# Patient Record
Sex: Female | Born: 1940 | Race: White | Hispanic: No | Smoking: Never smoker
Health system: Southern US, Community
[De-identification: ages and names within clinical notes are randomized; demographics above are authoritative.]

## PROBLEM LIST (undated history)

## (undated) DIAGNOSIS — C50919 Malignant neoplasm of unspecified site of unspecified female breast: Secondary | ICD-10-CM

## (undated) DIAGNOSIS — M1990 Unspecified osteoarthritis, unspecified site: Secondary | ICD-10-CM

## (undated) DIAGNOSIS — Z5189 Encounter for other specified aftercare: Secondary | ICD-10-CM

## (undated) DIAGNOSIS — F419 Anxiety disorder, unspecified: Secondary | ICD-10-CM

## (undated) DIAGNOSIS — T7840XA Allergy, unspecified, initial encounter: Secondary | ICD-10-CM

## (undated) DIAGNOSIS — J189 Pneumonia, unspecified organism: Secondary | ICD-10-CM

## (undated) DIAGNOSIS — R112 Nausea with vomiting, unspecified: Secondary | ICD-10-CM

## (undated) DIAGNOSIS — E039 Hypothyroidism, unspecified: Secondary | ICD-10-CM

## (undated) DIAGNOSIS — IMO0001 Reserved for inherently not codable concepts without codable children: Secondary | ICD-10-CM

## (undated) DIAGNOSIS — I10 Essential (primary) hypertension: Secondary | ICD-10-CM

## (undated) DIAGNOSIS — IMO0002 Reserved for concepts with insufficient information to code with codable children: Secondary | ICD-10-CM

## (undated) DIAGNOSIS — C7951 Secondary malignant neoplasm of bone: Secondary | ICD-10-CM

## (undated) DIAGNOSIS — Z9889 Other specified postprocedural states: Secondary | ICD-10-CM

## (undated) DIAGNOSIS — O2230 Deep phlebothrombosis in pregnancy, unspecified trimester: Secondary | ICD-10-CM

## (undated) DIAGNOSIS — Z923 Personal history of irradiation: Secondary | ICD-10-CM

## (undated) HISTORY — DX: Malignant neoplasm of unspecified site of unspecified female breast: C50.919

## (undated) HISTORY — PX: DILATION AND CURETTAGE OF UTERUS: SHX78

## (undated) HISTORY — DX: Reserved for concepts with insufficient information to code with codable children: IMO0002

## (undated) HISTORY — DX: Deep phlebothrombosis in pregnancy, unspecified trimester: O22.30

## (undated) HISTORY — DX: Essential (primary) hypertension: I10

## (undated) HISTORY — DX: Unspecified osteoarthritis, unspecified site: M19.90

## (undated) HISTORY — DX: Encounter for other specified aftercare: Z51.89

## (undated) HISTORY — DX: Hypothyroidism, unspecified: E03.9

## (undated) HISTORY — DX: Pneumonia, unspecified organism: J18.9

## (undated) HISTORY — DX: Allergy, unspecified, initial encounter: T78.40XA

## (undated) HISTORY — DX: Anxiety disorder, unspecified: F41.9

## (undated) HISTORY — DX: Reserved for inherently not codable concepts without codable children: IMO0001

---

## 2000-10-30 ENCOUNTER — Emergency Department (HOSPITAL_COMMUNITY): Admission: EM | Admit: 2000-10-30 | Discharge: 2000-10-30 | Payer: Self-pay | Admitting: Emergency Medicine

## 2000-10-30 ENCOUNTER — Encounter: Payer: Self-pay | Admitting: Emergency Medicine

## 2000-11-01 ENCOUNTER — Emergency Department (HOSPITAL_COMMUNITY): Admission: EM | Admit: 2000-11-01 | Discharge: 2000-11-01 | Payer: Self-pay | Admitting: *Deleted

## 2006-02-25 ENCOUNTER — Emergency Department (HOSPITAL_COMMUNITY): Admission: EM | Admit: 2006-02-25 | Discharge: 2006-02-25 | Payer: Self-pay | Admitting: Emergency Medicine

## 2009-10-01 ENCOUNTER — Encounter: Admission: RE | Admit: 2009-10-01 | Discharge: 2009-10-01 | Payer: Self-pay | Admitting: Family Medicine

## 2009-10-13 ENCOUNTER — Encounter: Admission: RE | Admit: 2009-10-13 | Discharge: 2009-10-13 | Payer: Self-pay | Admitting: Family Medicine

## 2011-05-22 ENCOUNTER — Ambulatory Visit (INDEPENDENT_AMBULATORY_CARE_PROVIDER_SITE_OTHER): Payer: Medicare Other

## 2011-05-22 DIAGNOSIS — J019 Acute sinusitis, unspecified: Secondary | ICD-10-CM

## 2011-06-06 ENCOUNTER — Ambulatory Visit (INDEPENDENT_AMBULATORY_CARE_PROVIDER_SITE_OTHER): Payer: Medicare Other | Admitting: Family Medicine

## 2011-06-06 DIAGNOSIS — E039 Hypothyroidism, unspecified: Secondary | ICD-10-CM | POA: Diagnosis not present

## 2011-06-06 DIAGNOSIS — J0100 Acute maxillary sinusitis, unspecified: Secondary | ICD-10-CM

## 2011-08-22 ENCOUNTER — Ambulatory Visit (INDEPENDENT_AMBULATORY_CARE_PROVIDER_SITE_OTHER): Payer: Medicare Other | Admitting: Family Medicine

## 2011-08-22 VITALS — BP 146/80 | HR 98 | Temp 98.0°F | Resp 24 | Ht 63.5 in | Wt 228.6 lb

## 2011-08-22 DIAGNOSIS — E039 Hypothyroidism, unspecified: Secondary | ICD-10-CM

## 2011-08-22 DIAGNOSIS — R358 Other polyuria: Secondary | ICD-10-CM

## 2011-08-22 DIAGNOSIS — I10 Essential (primary) hypertension: Secondary | ICD-10-CM

## 2011-08-22 DIAGNOSIS — R3589 Other polyuria: Secondary | ICD-10-CM

## 2011-08-22 DIAGNOSIS — R0602 Shortness of breath: Secondary | ICD-10-CM

## 2011-08-22 DIAGNOSIS — F418 Other specified anxiety disorders: Secondary | ICD-10-CM

## 2011-08-22 DIAGNOSIS — K089 Disorder of teeth and supporting structures, unspecified: Secondary | ICD-10-CM

## 2011-08-22 LAB — POCT UA - MICROSCOPIC ONLY
Casts, Ur, LPF, POC: NEGATIVE
Mucus, UA: NEGATIVE
Yeast, UA: NEGATIVE

## 2011-08-22 NOTE — Progress Notes (Signed)
  Subjective:    Patient ID: Alicia Vasquez, female    DOB: 07/10/1940, 71 y.o.   MRN: [id number]  HPI  Patient presents for routine follow up  States knee arthralgias limits activity. DOE as patient has become more sedentary.(Cardio-pulmonary evaluation normal)  Pruritis in nipple area; has not had mammogram in several years.  Hair thinner Polyuria  Lots of stress and worry     Review of Systems     Objective:   Physical Exam  Constitutional: She appears well-developed.  HENT:  Mouth/Throat: Abnormal dentition.    Neck: Neck supple. No thyromegaly present.  Cardiovascular: Normal rate, regular rhythm and normal heart sounds.   No murmur heard. Pulmonary/Chest: Effort normal and breath sounds normal. Right breast exhibits no inverted nipple, no mass, no nipple discharge and no skin change. Left breast exhibits no inverted nipple, no mass, no nipple discharge and no skin change.          Assessment & Plan:   1. Polyuria  POCT UA - Microscopic Only  2. HTN (hypertension)    3. Hypothyroid    4. SOB (shortness of breath), related to deconditioning    5. Depression with anxiety    6. Dental disease     Resume Celexa Encouraged short walks daily for conditioning increasing her walks by 1 minute weekly Mammogram to evaluate nipple pruritis Dental visit for extractions Follow up in 1 month

## 2011-08-26 ENCOUNTER — Encounter: Payer: Self-pay | Admitting: Family Medicine

## 2011-08-26 DIAGNOSIS — E039 Hypothyroidism, unspecified: Secondary | ICD-10-CM | POA: Insufficient documentation

## 2011-08-26 DIAGNOSIS — R0602 Shortness of breath: Secondary | ICD-10-CM | POA: Insufficient documentation

## 2011-08-26 MED ORDER — CITALOPRAM HYDROBROMIDE 10 MG PO TABS: 10.0000 mg | ORAL_TABLET | Freq: Every day | ORAL | Status: DC

## 2011-09-16 ENCOUNTER — Telehealth: Payer: Self-pay

## 2011-09-16 DIAGNOSIS — Z1239 Encounter for other screening for malignant neoplasm of breast: Secondary | ICD-10-CM

## 2011-09-16 DIAGNOSIS — L299 Pruritus, unspecified: Secondary | ICD-10-CM

## 2011-09-16 NOTE — Telephone Encounter (Signed)
Can we make this referral?

## 2011-09-16 NOTE — Telephone Encounter (Signed)
PT REQUESTS [facility] THEY INFORMED PT SHE CANNOT JUST SCHEDULE APPT W/O AN ORDER FROM HER PCP

## 2011-09-17 NOTE — Telephone Encounter (Signed)
Called pt [facility] referral initiated

## 2011-09-17 NOTE — Telephone Encounter (Signed)
Referral initiated

## 2011-09-19 ENCOUNTER — Other Ambulatory Visit: Payer: Self-pay | Admitting: Internal Medicine

## 2011-09-19 DIAGNOSIS — N644 Mastodynia: Secondary | ICD-10-CM

## 2011-09-26 ENCOUNTER — Ambulatory Visit (INDEPENDENT_AMBULATORY_CARE_PROVIDER_SITE_OTHER): Payer: Medicare Other | Admitting: Family Medicine

## 2011-09-26 ENCOUNTER — Encounter: Payer: Self-pay | Admitting: Family Medicine

## 2011-09-26 VITALS — BP 160/100 | HR 72 | Temp 97.0°F | Resp 16 | Ht 63.0 in | Wt 227.6 lb

## 2011-09-26 DIAGNOSIS — E039 Hypothyroidism, unspecified: Secondary | ICD-10-CM | POA: Diagnosis not present

## 2011-09-26 DIAGNOSIS — I10 Essential (primary) hypertension: Secondary | ICD-10-CM | POA: Diagnosis not present

## 2011-09-26 DIAGNOSIS — J302 Other seasonal allergic rhinitis: Secondary | ICD-10-CM

## 2011-09-26 DIAGNOSIS — F418 Other specified anxiety disorders: Secondary | ICD-10-CM

## 2011-09-26 DIAGNOSIS — R0789 Other chest pain: Secondary | ICD-10-CM

## 2011-09-26 MED ORDER — FLUTICASONE PROPIONATE 50 MCG/ACT NA SUSP: 2.0000 | Freq: Every day | NASAL | Status: DC
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
## 2011-09-27 ENCOUNTER — Ambulatory Visit
Admission: RE | Admit: 2011-09-27 | Discharge: 2011-09-27 | Disposition: A | Discharge status: Home / Self Care | Payer: Medicare Other | Source: Ambulatory Visit | Attending: Internal Medicine | Admitting: Internal Medicine

## 2011-09-27 ENCOUNTER — Other Ambulatory Visit: Payer: Self-pay | Admitting: Internal Medicine

## 2011-09-27 DIAGNOSIS — N6019 Diffuse cystic mastopathy of unspecified breast: Secondary | ICD-10-CM | POA: Diagnosis not present

## 2011-09-27 DIAGNOSIS — H9190 Unspecified hearing loss, unspecified ear: Secondary | ICD-10-CM | POA: Insufficient documentation

## 2011-09-27 DIAGNOSIS — N644 Mastodynia: Secondary | ICD-10-CM

## 2011-09-27 DIAGNOSIS — N63 Unspecified lump in unspecified breast: Secondary | ICD-10-CM | POA: Diagnosis not present

## 2011-09-27 DIAGNOSIS — J302 Other seasonal allergic rhinitis: Secondary | ICD-10-CM | POA: Insufficient documentation

## 2011-09-27 DIAGNOSIS — R0789 Other chest pain: Secondary | ICD-10-CM | POA: Insufficient documentation

## 2011-09-27 DIAGNOSIS — I10 Essential (primary) hypertension: Secondary | ICD-10-CM | POA: Insufficient documentation

## 2011-10-05 ENCOUNTER — Other Ambulatory Visit: Payer: Self-pay | Admitting: Family Medicine

## 2011-10-12 ENCOUNTER — Ambulatory Visit (INDEPENDENT_AMBULATORY_CARE_PROVIDER_SITE_OTHER): Payer: Medicare Other | Admitting: Family Medicine

## 2011-10-12 VITALS — BP 155/85 | HR 77 | Temp 98.4°F | Resp 16 | Ht 64.0 in | Wt 229.0 lb

## 2011-10-12 DIAGNOSIS — J069 Acute upper respiratory infection, unspecified: Secondary | ICD-10-CM | POA: Diagnosis not present

## 2011-10-12 DIAGNOSIS — J329 Chronic sinusitis, unspecified: Secondary | ICD-10-CM

## 2011-10-12 DIAGNOSIS — J40 Bronchitis, not specified as acute or chronic: Secondary | ICD-10-CM

## 2011-10-12 MED ORDER — AZITHROMYCIN 250 MG PO TABS: ORAL_TABLET | ORAL | Status: DC

## 2011-10-12 NOTE — Patient Instructions (Signed)

## 2011-10-12 NOTE — Progress Notes (Signed)
This 71 year old woman has a two-day history of progressive sinus congestion, headache, myalgia, and cough. The cough has been particularly bad with small amount of phlegm produced. Patient has no fever, stiff neck, hemoptysis, or epistaxis. No acid reflux.  Objective: HEENT shows mucopurulent discharge bilaterally in the nasal passages, otherwise there is mild serous otitis changes.  Chest: Few rhonchi, no rales  Heart: Regular no murmur  Assessment: Acute URI with sinusitis and bronchitis  Plan: Z-Pak and Robitussin

## 2011-10-17 ENCOUNTER — Encounter: Payer: Self-pay | Admitting: Family Medicine

## 2011-10-17 ENCOUNTER — Ambulatory Visit: Payer: Medicare Other | Admitting: Family Medicine

## 2011-10-17 ENCOUNTER — Ambulatory Visit (INDEPENDENT_AMBULATORY_CARE_PROVIDER_SITE_OTHER): Payer: Medicare Other | Admitting: Family Medicine

## 2011-10-17 VITALS — BP 146/76 | HR 75 | Temp 97.0°F | Resp 20 | Ht 63.5 in | Wt 228.0 lb

## 2011-10-17 DIAGNOSIS — J329 Chronic sinusitis, unspecified: Secondary | ICD-10-CM

## 2011-10-17 DIAGNOSIS — J42 Unspecified chronic bronchitis: Secondary | ICD-10-CM | POA: Diagnosis not present

## 2011-10-17 DIAGNOSIS — I10 Essential (primary) hypertension: Secondary | ICD-10-CM

## 2011-10-17 DIAGNOSIS — J209 Acute bronchitis, unspecified: Secondary | ICD-10-CM

## 2011-10-17 DIAGNOSIS — J40 Bronchitis, not specified as acute or chronic: Secondary | ICD-10-CM

## 2011-10-17 DIAGNOSIS — E039 Hypothyroidism, unspecified: Secondary | ICD-10-CM

## 2011-10-17 MED ORDER — ALBUTEROL SULFATE (2.5 MG/3ML) 0.083% IN NEBU: 2.5000 mg | INHALATION_SOLUTION | Freq: Once | RESPIRATORY_TRACT | Status: DC

## 2011-10-17 MED ORDER — AZITHROMYCIN 250 MG PO TABS: ORAL_TABLET | ORAL | Status: AC

## 2011-10-17 MED ORDER — IPRATROPIUM BROMIDE 0.02 % IN SOLN: 0.5000 mg | Freq: Once | RESPIRATORY_TRACT | Status: DC

## 2011-10-17 MED ORDER — ALBUTEROL SULFATE HFA 108 (90 BASE) MCG/ACT IN AERS: 2.0000 | INHALATION_SPRAY | Freq: Four times a day (QID) | RESPIRATORY_TRACT | Status: DC | PRN

## 2011-10-17 NOTE — Progress Notes (Signed)
  Subjective:    Patient ID: Alicia Vasquez, female    DOB: 1940-12-26, 71 y.o.   MRN: [id number]  HPI  Patient presents for routine follow up  Bronchitis- seeprior OV; still with facial congestion, post nasal drainage and cough.                    Cough worse at night                    Otic fullness with further impairment in herting                    Has used flonase with some relief                    Finishing Z pack today.   Health Maintenance: Mammogram/Ultrasound- normal Review of Systems     Objective:   Physical Exam  Constitutional: She appears well-developed.  HENT:  Right Ear: Tympanic membrane is retracted.  Left Ear: Tympanic membrane is retracted.  Nose: Rhinorrhea present.  Neck: Neck supple.  Cardiovascular: Normal rate, regular rhythm and normal heart sounds.   Pulmonary/Chest: Effort normal.       Prolong expiratory phase Crackles at (B) bases which cleared after albuterol via HHN  Lymphadenopathy:    She has no cervical adenopathy.       Assessment & Plan:   1. Bronchitis  albuterol (PROVENTIL) (2.5 MG/3ML) 0.083% nebulizer solution 2.5 mg, ipratropium (ATROVENT) nebulizer solution 0.5 mg, albuterol (PROVENTIL HFA;VENTOLIN HFA) 108 (90 BASE) MCG/ACT inhaler, azithromycin (ZITHROMAX Z-PAK) 250 MG tablet  2. Hearing loss  Encouraged patient to reshedule audiometry exam for hearing aides  3. HTN (hypertension)  Continue current medications  4. Hypothyroid  Continue current medications  5. Sinusitis  azithromycin (ZITHROMAX Z-PAK) 250 MG tablet   Follow up 1 week

## 2011-10-22 ENCOUNTER — Ambulatory Visit (INDEPENDENT_AMBULATORY_CARE_PROVIDER_SITE_OTHER): Payer: Medicare Other | Admitting: Family Medicine

## 2011-10-22 VITALS — BP 134/80 | HR 81 | Temp 97.8°F | Resp 16 | Ht 63.5 in | Wt 228.0 lb

## 2011-10-22 DIAGNOSIS — H6980 Other specified disorders of Eustachian tube, unspecified ear: Secondary | ICD-10-CM

## 2011-10-22 DIAGNOSIS — J45909 Unspecified asthma, uncomplicated: Secondary | ICD-10-CM | POA: Diagnosis not present

## 2011-10-22 NOTE — Progress Notes (Signed)
  Subjective:    Patient ID: Alicia Vasquez, female    DOB: [birth year], 71 y.o.   MRN: [id number]  HPI   Patient presents in follow up of asthmatic bronchitis  States much improved.  Intermittant cough and some otic fullness No SOB/dyspnea    Review of Systems     Objective:   Physical Exam  Constitutional: She appears well-developed.  HENT:  Nose: Nose normal.  Mouth/Throat: Oropharynx is clear and moist.       TM's normal  Neck: Neck supple.  Cardiovascular: Normal rate, regular rhythm and normal heart sounds.   Pulmonary/Chest: Effort normal and breath sounds normal.  Neurological: She is alert.  Skin: Skin is warm.           Assessment & Plan:  Asthmatic bronchitis; with improvement ETD HOH HTN Hypothyroid Depression  Complete antibiotics Anticipatory guidance Needs hearing evaluation Continue to use Albuterol MDI prn Continue current medications

## 2011-10-31 ENCOUNTER — Encounter: Payer: Self-pay | Admitting: Family Medicine

## 2011-10-31 ENCOUNTER — Ambulatory Visit (INDEPENDENT_AMBULATORY_CARE_PROVIDER_SITE_OTHER): Payer: Medicare Other | Admitting: Family Medicine

## 2011-10-31 VITALS — BP 136/80 | HR 82 | Temp 98.0°F | Resp 16 | Ht 63.5 in | Wt 231.8 lb

## 2011-10-31 DIAGNOSIS — I10 Essential (primary) hypertension: Secondary | ICD-10-CM | POA: Diagnosis not present

## 2011-10-31 DIAGNOSIS — H9190 Unspecified hearing loss, unspecified ear: Secondary | ICD-10-CM

## 2011-10-31 DIAGNOSIS — E039 Hypothyroidism, unspecified: Secondary | ICD-10-CM

## 2011-10-31 DIAGNOSIS — F32A Depression, unspecified: Secondary | ICD-10-CM

## 2011-10-31 DIAGNOSIS — F329 Major depressive disorder, single episode, unspecified: Secondary | ICD-10-CM

## 2011-10-31 NOTE — Progress Notes (Signed)
  Subjective:    Patient ID: Alicia Vasquez, female    DOB: 11/05/1940, 71 y.o.   MRN: [id number]  HPI  Patient presents for routine follow up.  Compliant with medications and denies side effects  Residual cough from recent respiratory illness  Hearing loss progressive; had cancelled audiology visit secondary to illness has not yet rescheduled.   Review of Systems     Objective:   Physical Exam  Constitutional: She appears well-developed.  HENT:  Head: Normocephalic and atraumatic.       Clear nasal drainage  Neck: Neck supple. No thyromegaly present.  Cardiovascular: Normal rate, regular rhythm and normal heart sounds.   Pulmonary/Chest: Effort normal and breath sounds normal.  Neurological: She is alert.  Skin: Skin is warm.       Assessment & Plan:   1. Hypothyroid  TSH  2. HTN (hypertension)    3. Depression    4. Hearing loss     Continue current medications Encourage patient to reschedule audiology visit Reassurance/anticipatory guidance

## 2011-11-01 LAB — TSH: TSH: 1.93 u[IU]/mL (ref 0.350–4.500)

## 2011-11-03 ENCOUNTER — Encounter: Payer: Self-pay | Admitting: Family Medicine

## 2011-11-07 ENCOUNTER — Telehealth: Payer: Self-pay

## 2011-11-07 NOTE — Telephone Encounter (Signed)
Left message that labs were normal and that copy was sent out last week

## 2011-11-07 NOTE — Telephone Encounter (Signed)
PATIENT OF [facility] - WANTS TO KNOW HER LAB RESULTS.  ALSO WANTS THEM MAILED TO HER.  PLEASE CALL

## 2011-12-22 ENCOUNTER — Encounter: Payer: Self-pay | Admitting: Family Medicine

## 2011-12-22 ENCOUNTER — Ambulatory Visit (INDEPENDENT_AMBULATORY_CARE_PROVIDER_SITE_OTHER): Payer: Medicare Other | Admitting: Family Medicine

## 2011-12-22 VITALS — BP 138/80 | HR 73 | Temp 97.7°F | Resp 16 | Ht 63.5 in | Wt 226.8 lb

## 2011-12-22 DIAGNOSIS — T148 Other injury of unspecified body region: Secondary | ICD-10-CM | POA: Diagnosis not present

## 2011-12-22 DIAGNOSIS — J019 Acute sinusitis, unspecified: Secondary | ICD-10-CM

## 2011-12-22 DIAGNOSIS — R42 Dizziness and giddiness: Secondary | ICD-10-CM | POA: Diagnosis not present

## 2011-12-22 DIAGNOSIS — J329 Chronic sinusitis, unspecified: Secondary | ICD-10-CM

## 2011-12-22 DIAGNOSIS — H9120 Sudden idiopathic hearing loss, unspecified ear: Secondary | ICD-10-CM

## 2011-12-22 DIAGNOSIS — W57XXXA Bitten or stung by nonvenomous insect and other nonvenomous arthropods, initial encounter: Secondary | ICD-10-CM | POA: Diagnosis not present

## 2011-12-22 MED ORDER — LEVOFLOXACIN 500 MG PO TABS: 500.0000 mg | ORAL_TABLET | Freq: Every day | ORAL | Status: AC

## 2011-12-22 NOTE — Patient Instructions (Addendum)
Take a probiotic with the antibiotic.    Sinusitis Sinuses are air pockets within the bones of your face. The growth of bacteria within a sinus leads to infection. The infection prevents the sinuses from draining. This infection is called sinusitis. SYMPTOMS  There will be different areas of pain depending on which sinuses have become infected.  The maxillary sinuses often produce pain beneath the eyes.   Frontal sinusitis may cause pain in the middle of the forehead and above the eyes.  Other problems (symptoms) include:  Toothaches.   Colored, pus-like (purulent) drainage from the nose.   Swelling, warmth, and tenderness over the sinus areas may be signs of infection.  TREATMENT  Sinusitis is most often determined by an exam.X-rays may be taken. If x-rays have been taken, make sure you obtain your results or find out how you are to obtain them. Your caregiver may give you medications (antibiotics). These are medications that will help kill the bacteria causing the infection. You may also be given a medication (decongestant) that helps to reduce sinus swelling.  HOME CARE INSTRUCTIONS   Only take over-the-counter or prescription medicines for pain, discomfort, or fever as directed by your caregiver.   Drink extra fluids. Fluids help thin the mucus so your sinuses can drain more easily.   Applying either moist heat or ice packs to the sinus areas may help relieve discomfort.   Use saline nasal sprays to help moisten your sinuses. The sprays can be found at your local drugstore.  SEEK IMMEDIATE MEDICAL CARE IF:  You have a fever.   You have increasing pain, severe headaches, or toothache.   You have nausea, vomiting, or drowsiness.   You develop unusual swelling around the face or trouble seeing.  MAKE SURE YOU:   Understand these instructions.   Will watch your condition.   Will get help right away if you are not doing well or get worse.  Document Released: 05/16/2005  Document Revised: 05/05/2011 Document Reviewed: 12/13/2006 [facility] Patient Information 2012 [place], [place].

## 2011-12-22 NOTE — Progress Notes (Signed)
71 yo with persistent cough, left hearing loss and fullness, chest fullness, fatigue over 6 weeks.  Also having dizziness post comer Also has recent insect bite (5 days ago) right shoulder area with persistent erythema.  No other rash.  Had fever last Saturday night.    @[facility]GO @   Patient ID: Alicia Vasquez MRN: [id number], DOB: 05-11-1941, 71 y.o. Date of Encounter: 12/22/2011, 12:04 PM  Primary Physician: Dois Davenport., MD  Chief Complaint:  Chief Complaint  Patient presents with  . Follow-up    HPI: 71 y.o. year old female presents with 50 day history of nasal congestion, post nasal drip, sore throat, sinus pressure, and cough. Afebrile. No chills. Nasal congestion thick and green/yellow. Sinus pressure is the worst symptom. Cough is productive secondary to post nasal drip and not associated with time of day. Ears feel full, leading to sensation of muffled hearing. Has tried OTC cold preps without success. No GI complaints. Appetite fair  No recent antibiotics, recent travels, or sick contacts   No leg trauma, sedentary periods, h/o cancer, or tobacco use.  No past medical history on file.   Home Meds: Prior to Admission medications   Medication Sig Start Date End Date Taking? Authorizing Provider  albuterol (PROVENTIL HFA;VENTOLIN HFA) 108 (90 BASE) MCG/ACT inhaler Inhale 2 puffs into the lungs every 6 (six) hours as needed. 10/17/11  Yes Dois Davenport, MD  citalopram (CELEXA) 10 MG tablet Take 1 tablet (10 mg total) by mouth daily. 08/26/11 08/25/12 Yes Dois Davenport, MD  fluticasone (FLONASE) 50 MCG/ACT nasal spray Place 2 sprays into the nose daily. 09/26/11 09/25/12 Yes Dois Davenport, MD  ibuprofen (ADVIL,MOTRIN) 200 MG tablet Take 200 mg by mouth every 6 (six) hours as needed.   Yes Historical Provider, MD  lisinopril-hydrochlorothiazide (PRINZIDE,ZESTORETIC) 10-12.5 MG per tablet Take 1 tablet by mouth daily.   Yes Historical Provider, MD  SYNTHROID 100 MCG tablet  TAKE 1 TABLET BY MOUTH ONCE A DAY 10/05/11  Yes Ryan M Dunn, PA-C  fluticasone (FLONASE) 50 MCG/ACT nasal spray Place 2 sprays into the nose daily.    Historical Provider, MD    Allergies:  Allergies  Allergen Reactions  . Benadryl (Diphenhydramine Hcl)   . Carbocaine (Mepivacaine Hcl)   . Codeine   . Penicillins   . Sulfa Antibiotics   . Vicodin (Hydrocodone-Acetaminophen)     History   Social History  . Marital Status: Single    Spouse Name: N/A    Number of Children: N/A  . Years of Education: N/A   Occupational History  . Not on file.   Social History Main Topics  . Smoking status: Never Smoker   . Smokeless tobacco: Not on file  . Alcohol Use: Not on file  . Drug Use: Not on file  . Sexually Active: Not on file   Other Topics Concern  . Not on file   Social History Narrative  . No narrative on file     Review of Systems: Constitutional: negative for chills, fever, night sweats or weight changes Cardiovascular: negative for chest pain or palpitations Respiratory: negative for hemoptysis, wheezing, or shortness of breath Abdominal: negative for abdominal pain, nausea, vomiting or diarrhea Dermatological: negative for rash except right shoulder area Neurologic: negative for headache   Physical Exam: Blood pressure 138/80, pulse 73, temperature 97.7 F (36.5 C), temperature source Oral, resp. rate 16, height 5' 3.5" (1.613 m), weight 226 lb 12.8 oz (102.876 kg), SpO2 96.00%., Body mass index is  39.55 kg/(m^2). General: Well developed, well nourished, in no acute distress. Head: Normocephalic, atraumatic, eyes without discharge, sclera non-icteric, nares are congested. Bilateral auditory canals clear, TM's are without perforation, pearly grey with reflective cone of light bilaterally. Serous effusion bilaterally behind TM's. Maxillary sinus TTP. Oral cavity moist, dentition normal. Posterior pharynx with post nasal drip and mild erythema. No peritonsillar abscess or  tonsillar exudate.  Marked erosions of teeth number 5 and 7 Neck: Supple. No thyromegaly. Full ROM. No lymphadenopathy. Lungs: Clear bilaterally to auscultation without wheezes, rales, or rhonchi. Breathing is unlabored.  Heart: RRR with S1 S2. No murmurs, rubs, or gallops appreciated. Msk:  Strength and tone normal for age. Extremities: No clubbing or cyanosis. No edema. Neuro: Alert and oriented X 3. Moves all extremities spontaneously. CNII-XII grossly in tact. Psych:  Responds to questions appropriately with a normal affect.   Labs:   ASSESSMENT AND PLAN:  71 y.o. year old female with sinusitis, tick bite, and dizziness. - Follow up in a week.  -Tylenol/Motrin prn -Rest/fluids -RTC precautions -RTC 3-5 days if no improvement  Signed, Elvina Sidle, MD 12/22/2011 12:04 PM

## 2011-12-23 LAB — ROCKY MTN SPOTTED FVR AB, IGM-BLOOD: ROCKY MTN SPOTTED FEVER, IGM: 0.17 IV

## 2011-12-27 LAB — B. BURGDORFI ANTIBODIES BY WB
B burgdorferi IgG Abs (IB): NEGATIVE
B burgdorferi IgM Abs (IB): NEGATIVE

## 2012-02-12 ENCOUNTER — Ambulatory Visit (INDEPENDENT_AMBULATORY_CARE_PROVIDER_SITE_OTHER): Payer: Medicare Other | Admitting: Emergency Medicine

## 2012-02-12 VITALS — BP 142/78 | HR 101 | Temp 97.7°F | Resp 16 | Ht 64.0 in | Wt 231.0 lb

## 2012-02-12 DIAGNOSIS — J40 Bronchitis, not specified as acute or chronic: Secondary | ICD-10-CM | POA: Diagnosis not present

## 2012-02-12 DIAGNOSIS — J018 Other acute sinusitis: Secondary | ICD-10-CM

## 2012-02-12 MED ORDER — CEFPROZIL 500 MG PO TABS: 500.0000 mg | ORAL_TABLET | Freq: Two times a day (BID) | ORAL | Status: AC

## 2012-02-12 MED ORDER — PSEUDOEPHEDRINE-GUAIFENESIN ER 60-600 MG PO TB12: 1.0000 | ORAL_TABLET | Freq: Two times a day (BID) | ORAL | Status: DC

## 2012-02-12 NOTE — Progress Notes (Signed)
Date:  02/12/2012   Name:  Alicia Vasquez   DOB:  [birth year]   MRN:  [id number] Gender: female Age: 71 y.o.  PCP:  Dois Davenport., MD    Chief Complaint: URI and Cough   History of Present Illness:  Alicia Vasquez is a 71 y.o. pleasant patient who presents with the following:  Ill since Tuesday with headache, nasal congestion and clear nasal drainage.  Has post nasal drainage with a foul taste in her mouth.  Has a cough mucoid sputum.  No fever or chills.  No wheezing or shortness of breath.  Says that she feels hot but has not recorded an elevated temperature.  Has painful lymph nodes.  No nausea or vomiting.  No stool change.  No improvement with OTC medications.  Patient Active Problem List  Diagnosis  . SOB (shortness of breath), related to deconditioning  . Hypothyroid  . HTN (hypertension)  . Hearing loss  . Chest wall pain  . Seasonal allergies    No past medical history on file.  No past surgical history on file.  History  Substance Use Topics  . Smoking status: Never Smoker   . Smokeless tobacco: Not on file  . Alcohol Use: Not on file    No family history on file.  Allergies  Allergen Reactions  . Benadryl (Diphenhydramine Hcl)   . Carbocaine (Mepivacaine Hcl)   . Codeine   . Penicillins   . Sulfa Antibiotics   . Vicodin (Hydrocodone-Acetaminophen)     Medication list has been reviewed and updated.  Outpatient Prescriptions Prior to Visit  Medication Sig Dispense Refill  . albuterol (PROVENTIL HFA;VENTOLIN HFA) 108 (90 BASE) MCG/ACT inhaler Inhale 2 puffs into the lungs every 6 (six) hours as needed.  1 Inhaler  0  . fluticasone (FLONASE) 50 MCG/ACT nasal spray Place 2 sprays into the nose daily.  1 g  6  . ibuprofen (ADVIL,MOTRIN) 200 MG tablet Take 200 mg by mouth every 6 (six) hours as needed.      Marland Kitchen lisinopril-hydrochlorothiazide (PRINZIDE,ZESTORETIC) 10-12.5 MG per tablet Take 1 tablet by mouth daily.      Marland Kitchen SYNTHROID 100 MCG tablet TAKE 1  TABLET BY MOUTH ONCE A DAY  90 tablet  1   Facility-Administered Medications Prior to Visit  Medication Dose Route Frequency Provider Last Rate Last Dose  . albuterol (PROVENTIL) (2.5 MG/3ML) 0.083% nebulizer solution 2.5 mg  2.5 mg Nebulization Once Dois Davenport, MD      . ipratropium (ATROVENT) nebulizer solution 0.5 mg  0.5 mg Nebulization Once Dois Davenport, MD        Review of Systems:  As per HPI, otherwise negative.    Physical Examination: Filed Vitals:   02/12/12 1716  BP: 142/78  Pulse: 101  Temp: 97.7 F (36.5 C)  Resp: 16   Filed Vitals:   02/12/12 1716  Height: 5\' 4"  (1.626 m)  Weight: 231 lb (104.781 kg)   Body mass index is 39.65 kg/(m^2). Ideal Body Weight: Weight in (lb) to have BMI = 25: 145.3   GEN: WDWN, NAD, Non-toxic, A & O x 3 HEENT: Atraumatic, Normocephalic. Neck supple. No masses, No LAD.  Oropharynx negative Ears and Nose: No external deformity.  TM negative  Nasal mucosa swollen and erythematous with purulent drainage. Neck Supple with anterior cervical adenopathy CV: RRR, No M/G/R. No JVD. No thrill. No extra heart sounds. PULM: CTA B, no wheezes, crackles, rhonchi. No retractions. No resp. distress. No accessory  muscle use. ABD: S, NT, ND, +BS. No rebound. No HSM. EXTR: No c/c/e NEURO Normal gait.  PSYCH: Normally interactive. Conversant. Not depressed or anxious appearing.  Calm demeanor.    Assessment and Plan: Sinusitis Amoxicillin mucinex Follow up as needed  Alicia Dane, MD I have reviewed and agree with documentation. Alicia Vasquez, M.D.

## 2012-02-13 ENCOUNTER — Telehealth: Payer: Self-pay

## 2012-02-13 NOTE — Telephone Encounter (Signed)
Patient was here for sinusitis was given Cefzil, wants something cheaper to [facility]

## 2012-02-13 NOTE — Telephone Encounter (Signed)
PT SAYS THAT DR Dareen Piano GAVE HER RX FOR CEFPROZIL AND IT WAS TOO EXPENSIVE COULD WE CALL IN SOMETHING ELSE LESS EXPENSIVE TO [facility] IF POSSIBLE [phone]

## 2012-02-14 ENCOUNTER — Other Ambulatory Visit: Payer: Self-pay | Admitting: *Deleted

## 2012-02-14 MED ORDER — AZITHROMYCIN 250 MG PO TABS: ORAL_TABLET | ORAL | Status: DC

## 2012-02-14 NOTE — Telephone Encounter (Signed)
I have sent in a different antibiotic that will be cheaper and have avoided her medication allergies.

## 2012-02-14 NOTE — Telephone Encounter (Signed)
Left message for patient to advise this is done for her new med should be more cost effective.

## 2012-03-01 ENCOUNTER — Ambulatory Visit (INDEPENDENT_AMBULATORY_CARE_PROVIDER_SITE_OTHER): Payer: Medicare Other | Admitting: Family Medicine

## 2012-03-01 ENCOUNTER — Encounter: Payer: Self-pay | Admitting: Family Medicine

## 2012-03-01 VITALS — BP 138/72 | HR 71 | Temp 97.9°F | Resp 16 | Ht 63.0 in | Wt 229.4 lb

## 2012-03-01 DIAGNOSIS — R599 Enlarged lymph nodes, unspecified: Secondary | ICD-10-CM

## 2012-03-01 DIAGNOSIS — R590 Localized enlarged lymph nodes: Secondary | ICD-10-CM

## 2012-03-01 DIAGNOSIS — R5383 Other fatigue: Secondary | ICD-10-CM | POA: Diagnosis not present

## 2012-03-01 DIAGNOSIS — R5381 Other malaise: Secondary | ICD-10-CM

## 2012-03-01 DIAGNOSIS — R05 Cough: Secondary | ICD-10-CM | POA: Diagnosis not present

## 2012-03-01 DIAGNOSIS — R059 Cough, unspecified: Secondary | ICD-10-CM

## 2012-03-01 DIAGNOSIS — R490 Dysphonia: Secondary | ICD-10-CM

## 2012-03-01 NOTE — Patient Instructions (Signed)
Cough, Adult  A cough is a reflex that helps clear your throat and airways. It can help heal the body or may be a reaction to an irritated airway. A cough may only last 2 or 3 weeks (acute) or may last more than 8 weeks (chronic).  CAUSES Acute cough:  Viral or bacterial infections. Chronic cough:  Infections.  Allergies.  Asthma.  Post-nasal drip.  Smoking.  Heartburn or acid reflux.  Some medicines.  Chronic lung problems (COPD).  Cancer. SYMPTOMS   Cough.  Fever.  Chest pain.  Increased breathing rate.  High-pitched whistling sound when breathing (wheezing).  Colored mucus that you cough up (sputum). TREATMENT   A bacterial cough may be treated with antibiotic medicine.  A viral cough must run its course and will not respond to antibiotics.  Your caregiver may recommend other treatments if you have a chronic cough. HOME CARE INSTRUCTIONS   Only take over-the-counter or prescription medicines for pain, discomfort, or fever as directed by your caregiver. Use cough suppressants only as directed by your caregiver.  Use a cold steam vaporizer or humidifier in your bedroom or home to help loosen secretions.  Sleep in a semi-upright position if your cough is worse at night.  Rest as needed.  Stop smoking if you smoke. SEEK IMMEDIATE MEDICAL CARE IF:   You have pus in your sputum.  Your cough starts to worsen.  You cannot control your cough with suppressants and are losing sleep.  You begin coughing up blood.  You have difficulty breathing.  You develop pain which is getting worse or is uncontrolled with medicine.  You have a fever. MAKE SURE YOU:   Understand these instructions.  Will watch your condition.  Will get help right away if you are not doing well or get worse. Document Released: 11/12/2010 Document Revised: 08/08/2011 Document Reviewed: 11/12/2010 [employer] Patient Information 2013 [employer] Gastroesophageal Reflux Disease,  Adult Gastroesophageal reflux disease (GERD) happens when acid from your stomach flows up into the esophagus. When acid comes in contact with the esophagus, the acid causes soreness (inflammation) in the esophagus. Over time, GERD may create small holes (ulcers) in the lining of the esophagus. CAUSES   Increased body weight. This puts pressure on the stomach, making acid rise from the stomach into the esophagus.  Smoking. This increases acid production in the stomach.  Drinking alcohol. This causes decreased pressure in the lower esophageal sphincter (valve or ring of muscle between the esophagus and stomach), allowing acid from the stomach into the esophagus.  Late evening meals and a full stomach. This increases pressure and acid production in the stomach.  A malformed lower esophageal sphincter. Sometimes, no cause is found. SYMPTOMS   Burning pain in the lower part of the mid-chest behind the breastbone and in the mid-stomach area. This may occur twice a week or more often.  Trouble swallowing.  Sore throat.  Dry cough.  Asthma-like symptoms including chest tightness, shortness of breath, or wheezing. DIAGNOSIS  Your caregiver may be able to diagnose GERD based on your symptoms. In some cases, X-rays and other tests may be done to check for complications or to check the condition of your stomach and esophagus. TREATMENT  Your caregiver may recommend over-the-counter or prescription medicines to help decrease acid production. Ask your caregiver before starting or adding any new medicines.  HOME CARE INSTRUCTIONS   Change the factors that you can control. Ask your caregiver for guidance concerning weight loss, quitting smoking, and alcohol   consumption.  Avoid foods and drinks that make your symptoms worse, such as:  Caffeine or alcoholic drinks.  Chocolate.  Peppermint or mint flavorings.  Garlic and onions.  Spicy foods.  Citrus fruits, such as oranges, lemons, or  limes.  Tomato-based foods such as sauce, chili, salsa, and pizza.  Fried and fatty foods.  Avoid lying down for the 3 hours prior to your bedtime or prior to taking a nap.  Eat small, frequent meals instead of large meals.  Wear loose-fitting clothing. Do not wear anything tight around your waist that causes pressure on your stomach.  Raise the head of your bed 6 to 8 inches with wood blocks to help you sleep. Extra pillows will not help.  Only take over-the-counter or prescription medicines for pain, discomfort, or fever as directed by your caregiver.  Do not take aspirin, ibuprofen, or other nonsteroidal anti-inflammatory drugs (NSAIDs). SEEK IMMEDIATE MEDICAL CARE IF:   You have pain in your arms, neck, jaw, teeth, or back.  Your pain increases or changes in intensity or duration.  You develop nausea, vomiting, or sweating (diaphoresis).  You develop shortness of breath, or you faint.  Your vomit is green, yellow, black, or looks like coffee grounds or blood.  Your stool is red, bloody, or black. These symptoms could be signs of other problems, such as heart disease, gastric bleeding, or esophageal bleeding. MAKE SURE YOU:   Understand these instructions.  Will watch your condition.  Will get help right away if you are not doing well or get worse. Document Released: 02/23/2005 Document Revised: 08/08/2011 Document Reviewed: 12/03/2010 [employer] Patient Information 2013 [employer].  

## 2012-03-01 NOTE — Progress Notes (Signed)
71 yo woman with persistent hoarseness.  Since Sunday mid September she has had a sorethroat intermittently.  Not aware of any significant reflux symptoms.  She has some stinging symptoms in her right neck.  Objective:  NAD, calm  Neck:  Few shotty nodes left anterior throat Oroph:  Poor dentition Chest:  Clear  Assessment:  Chronic bronchitis which may be reflux or reactive airways  Also notes chronic fatigue  Plan:   Check TSH, CBC dulera nexium qd x 2 weeks ent referral. 1. Cough  TSH, CBC with Differential, Comprehensive metabolic panel  2. Fatigue  TSH, CBC with Differential, Comprehensive metabolic panel  3. Cervical adenopathy  CBC with Differential  4. Hoarseness  Ambulatory referral to ENT

## 2012-03-02 LAB — COMPREHENSIVE METABOLIC PANEL
ALT: 18 U/L (ref 0–35)
AST: 15 U/L (ref 0–37)
Albumin: 4.2 g/dL (ref 3.5–5.2)
Alkaline Phosphatase: 72 U/L (ref 39–117)
BUN: 13 mg/dL (ref 6–23)
CO2: 25 mEq/L (ref 19–32)
Calcium: 10.1 mg/dL (ref 8.4–10.5)
Chloride: 106 mEq/L (ref 96–112)
Creat: 0.78 mg/dL (ref 0.50–1.10)
Glucose, Bld: 94 mg/dL (ref 70–99)
Potassium: 4.1 mEq/L (ref 3.5–5.3)
Sodium: 140 mEq/L (ref 135–145)
Total Bilirubin: 0.4 mg/dL (ref 0.3–1.2)
Total Protein: 6.6 g/dL (ref 6.0–8.3)

## 2012-03-02 LAB — CBC WITH DIFFERENTIAL/PLATELET
Basophils Absolute: 0.1 10*3/uL (ref 0.0–0.1)
Basophils Relative: 1 % (ref 0–1)
Eosinophils Absolute: 0.3 10*3/uL (ref 0.0–0.7)
Eosinophils Relative: 4 % (ref 0–5)
HCT: 42.8 % (ref 36.0–46.0)
Hemoglobin: 14.1 g/dL (ref 12.0–15.0)
Lymphocytes Relative: 32 % (ref 12–46)
Lymphs Abs: 2 10*3/uL (ref 0.7–4.0)
MCH: 30.9 pg (ref 26.0–34.0)
MCHC: 32.9 g/dL (ref 30.0–36.0)
MCV: 93.7 fL (ref 78.0–100.0)
Monocytes Absolute: 0.8 10*3/uL (ref 0.1–1.0)
Monocytes Relative: 13 % — ABNORMAL HIGH (ref 3–12)
Neutro Abs: 3.2 10*3/uL (ref 1.7–7.7)
Neutrophils Relative %: 50 % (ref 43–77)
Platelets: 286 10*3/uL (ref 150–400)
RBC: 4.57 MIL/uL (ref 3.87–5.11)
RDW: 13.7 % (ref 11.5–15.5)
WBC: 6.3 10*3/uL (ref 4.0–10.5)

## 2012-03-02 LAB — TSH: TSH: 2.495 u[IU]/mL (ref 0.350–4.500)

## 2012-03-03 ENCOUNTER — Telehealth: Payer: Self-pay

## 2012-03-03 NOTE — Telephone Encounter (Signed)
Dr.L please advise.

## 2012-03-03 NOTE — Telephone Encounter (Signed)
Patient states that Dr Milus Glazier was going to call in two Rx's for her but she is not sure if he called them in or if he sent them to a different pharmacy.  Patient states that is was Nexium and a nasal spray.   Best#: [phone]

## 2012-03-04 MED ORDER — ESOMEPRAZOLE MAGNESIUM 40 MG PO CPDR: 40.0000 mg | DELAYED_RELEASE_CAPSULE | Freq: Every day | ORAL | Status: DC

## 2012-03-04 MED ORDER — MOMETASONE FUROATE 50 MCG/ACT NA SUSP: 2.0000 | Freq: Every day | NASAL | Status: DC

## 2012-03-04 NOTE — Telephone Encounter (Signed)
Rx sent in

## 2012-03-04 NOTE — Telephone Encounter (Signed)
Spoke with pt advised RX

## 2012-03-04 NOTE — Telephone Encounter (Signed)
Please call in Nexium 40 qd #14 1 qd with 3 refills.  Also call in Nasonex for qd use each nostril #1, refill x 11

## 2012-03-22 ENCOUNTER — Encounter: Payer: Self-pay | Admitting: Family Medicine

## 2012-03-22 ENCOUNTER — Ambulatory Visit (INDEPENDENT_AMBULATORY_CARE_PROVIDER_SITE_OTHER): Payer: Medicare Other | Admitting: Family Medicine

## 2012-03-22 VITALS — BP 148/78 | HR 77 | Temp 98.2°F | Resp 16 | Ht 63.5 in | Wt 230.2 lb

## 2012-03-22 DIAGNOSIS — K047 Periapical abscess without sinus: Secondary | ICD-10-CM | POA: Diagnosis not present

## 2012-03-22 MED ORDER — CLINDAMYCIN HCL 150 MG PO CAPS: 150.0000 mg | ORAL_CAPSULE | Freq: Three times a day (TID) | ORAL | Status: DC

## 2012-03-22 NOTE — Progress Notes (Signed)
71 yo retired woman with persistent and widespread dental problems.  She has been to several dentists over the years and had multiple procedures.  In the recent past, she rec'd carbicaine and had an anaphylactic reaction.  She has been told to have future dental procedures under general anesthesia.  She not only has multiple exposed roots and gingivitis, but also has fullness in ears and swollen glands in neck  Objective: pleasant, well-informed woman in NAD Examination of head and neck:  Multiple broken teeth with gingivitis  Neck: no significant adenopathy  TM's:  Normal  Assessment:  I do not see an ENT problem at this point.  Rather, patient needs careful and thorough dental care  Plan:  Referred to dentist and oral surgeon. Yancey Flemings, DDS  [phone] Alicia Vasquez (if not available, Alicia Vasquez)

## 2012-03-22 NOTE — Patient Instructions (Signed)
Erasmo Leventhal, DDS  [phone] Elizebeth Koller, oral surgeon

## 2012-04-03 ENCOUNTER — Other Ambulatory Visit: Payer: Self-pay | Admitting: Family Medicine

## 2012-04-04 ENCOUNTER — Telehealth: Payer: Self-pay

## 2012-04-04 ENCOUNTER — Other Ambulatory Visit: Payer: Self-pay | Admitting: Physician Assistant

## 2012-04-04 MED ORDER — LISINOPRIL-HYDROCHLOROTHIAZIDE 10-12.5 MG PO TABS: 1.0000 | ORAL_TABLET | Freq: Every day | ORAL | Status: DC

## 2012-04-04 NOTE — Telephone Encounter (Signed)
Notified pt that I RFd both of her Rxs for 3 mos and then she will be due for an OV. Pt stated that she has an appt sch for Jan.

## 2012-04-04 NOTE — Telephone Encounter (Signed)
Pt is checking on the status of her med ordered by [facility]  lisinopril-hydrochlorothiazide (PRINZIDE,ZESTORETIC) 10-12.5 MG per tablet  (one pill left for tomorrow) SYNTHROID 100 MCG tablet   [id number]

## 2012-04-19 HISTORY — PX: DENTAL SURGERY: SHX609

## 2012-05-10 ENCOUNTER — Ambulatory Visit: Payer: Medicare Other

## 2012-05-10 ENCOUNTER — Ambulatory Visit (INDEPENDENT_AMBULATORY_CARE_PROVIDER_SITE_OTHER): Payer: Medicare Other | Admitting: Family Medicine

## 2012-05-10 VITALS — BP 178/72 | HR 63 | Temp 97.7°F | Resp 16 | Ht 63.0 in | Wt 225.0 lb

## 2012-05-10 DIAGNOSIS — M25571 Pain in right ankle and joints of right foot: Secondary | ICD-10-CM

## 2012-05-10 DIAGNOSIS — I10 Essential (primary) hypertension: Secondary | ICD-10-CM

## 2012-05-10 DIAGNOSIS — M25579 Pain in unspecified ankle and joints of unspecified foot: Secondary | ICD-10-CM

## 2012-05-10 DIAGNOSIS — S90851A Superficial foreign body, right foot, initial encounter: Secondary | ICD-10-CM

## 2012-05-10 NOTE — Progress Notes (Signed)
  Subjective:    Patient ID: Alicia Vasquez, female    DOB: 1940/10/11, 71 y.o.   MRN: [id number]  HPI Alicia Vasquez is a 71 y.o. female C/o R foot knot/pain.  Tripped over a vine 1 week ago  - no known initial injury to foot, but has had broken foot in this area prior to 2005.  Noticed knot on outside/top of R foot few days ago. More sore in this area. Has had some longstanding numbness in end of R foot since foot fracture years ago.    Tx: ice, some improvement.  Review of Systems  Musculoskeletal: Positive for joint swelling.  Skin: Negative for rash.       Objective:   Physical Exam  Vitals reviewed. Constitutional: She appears well-developed and well-nourished.  HENT:  Head: Normocephalic and atraumatic.  Cardiovascular:       Toes cool, but cap refill less than 1 second at toes.   Pulmonary/Chest: Effort normal.  Musculoskeletal:       Right ankle: Normal. no tenderness. No lateral malleolus and no medial malleolus tenderness found. Achilles tendon normal.       Feet:  Skin: Skin is warm and dry.       Dry skin diffusely with few scattered varicosities. To lower legs and feet.   Psychiatric: She has a normal mood and affect. Her behavior is normal.    [facility] reading (PRIMARY) by  Dr. Earnestine Leys foot - no apparent fx. approx 2mm radiopaque area dorsal/lateral to mid 5th metatarsal.      Assessment & Plan:  Alicia Vasquez is a 71 y.o. female 1. Pain, joint, foot, right  DG Foot Complete Right  2. Foreign body in foot, right    3. HTN (hypertension)     Multiple LE abrasions/cat scratches, but area of swelling not warm or erythematous, and does not appear to be lymphatic. No f/c.  Possible fb, but does not appear to be recent.  May have had irritation to prior FB.  Discussed options, but as able to walk on ok, can try sx care with heat or ice, elevation as able, tylenol otc and recheck in next few days if not improving. rtc sooner if redness or worsening. Understanding  expressed.   htn - has not taken meds yet today - recheck out of office and if still elevated on meds rtc.

## 2012-05-26 ENCOUNTER — Ambulatory Visit (INDEPENDENT_AMBULATORY_CARE_PROVIDER_SITE_OTHER): Payer: Medicare Other | Admitting: Family Medicine

## 2012-05-26 VITALS — BP 156/80 | HR 83 | Temp 98.1°F | Resp 18 | Ht 63.5 in | Wt 224.0 lb

## 2012-05-26 DIAGNOSIS — J40 Bronchitis, not specified as acute or chronic: Secondary | ICD-10-CM | POA: Diagnosis not present

## 2012-05-26 DIAGNOSIS — J069 Acute upper respiratory infection, unspecified: Secondary | ICD-10-CM

## 2012-05-26 DIAGNOSIS — J329 Chronic sinusitis, unspecified: Secondary | ICD-10-CM | POA: Diagnosis not present

## 2012-05-26 MED ORDER — AZITHROMYCIN 250 MG PO TABS: ORAL_TABLET | ORAL | Status: DC

## 2012-05-26 NOTE — Progress Notes (Signed)
Subjective: Patient been sick for the last couple of weeks with upper sparked or infection. She's had head congestion hoarseness sore throat cough. Her sinuses have been bothering her. She has felt like she had a fever at home, though she was afebrile here. She recently had a lot of dental work done also on November 21.  Objective: TMs normal. Nose congested. Throat not well visualized due to her gag reflex. Neck supple. Has tender nodes on the left. The thyroid feels normal. Her chest is clear but poor air exchange. Heart regular without murmurs.  Assessment: URI Sinusitis Bronchitis Cervical lymphadenitis  Plan: Treat with antibiotics and cough medications. Return if worse.

## 2012-05-26 NOTE — Patient Instructions (Signed)
Drink plenty of fluids.  Take Tylenol or ibuprofen as needed for aching or fever  Use over-the-counter Mucinex plain or Mucinex DM for cough  Take the antibiotic, azithromycin, as directed.  Use your nasal spray and the inhaler  Return if worse

## 2012-06-07 ENCOUNTER — Ambulatory Visit: Payer: Medicare Other | Admitting: Family Medicine

## 2012-06-18 ENCOUNTER — Other Ambulatory Visit: Payer: Self-pay | Admitting: Family Medicine

## 2012-06-21 ENCOUNTER — Ambulatory Visit: Payer: Medicare Other

## 2012-06-21 ENCOUNTER — Encounter: Payer: Self-pay | Admitting: Family Medicine

## 2012-06-21 ENCOUNTER — Ambulatory Visit (INDEPENDENT_AMBULATORY_CARE_PROVIDER_SITE_OTHER): Payer: Medicare Other | Admitting: Family Medicine

## 2012-06-21 DIAGNOSIS — E039 Hypothyroidism, unspecified: Secondary | ICD-10-CM | POA: Diagnosis not present

## 2012-06-21 DIAGNOSIS — R059 Cough, unspecified: Secondary | ICD-10-CM

## 2012-06-21 DIAGNOSIS — R05 Cough: Secondary | ICD-10-CM | POA: Diagnosis not present

## 2012-06-21 DIAGNOSIS — I10 Essential (primary) hypertension: Secondary | ICD-10-CM

## 2012-06-21 DIAGNOSIS — J329 Chronic sinusitis, unspecified: Secondary | ICD-10-CM | POA: Diagnosis not present

## 2012-06-21 MED ORDER — PREDNISONE 20 MG PO TABS: ORAL_TABLET | ORAL | Status: DC

## 2012-06-21 MED ORDER — LEVOFLOXACIN 500 MG PO TABS: 500.0000 mg | ORAL_TABLET | Freq: Every day | ORAL | Status: DC

## 2012-06-21 MED ORDER — SYNTHROID 100 MCG PO TABS: 100.0000 ug | ORAL_TABLET | Freq: Every day | ORAL | Status: DC

## 2012-06-21 MED ORDER — LISINOPRIL-HYDROCHLOROTHIAZIDE 10-12.5 MG PO TABS: 1.0000 | ORAL_TABLET | Freq: Every day | ORAL | Status: DC
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
## 2012-06-22 LAB — COMPREHENSIVE METABOLIC PANEL
ALT: 21 U/L (ref 0–35)
AST: 16 U/L (ref 0–37)
Albumin: 4.3 g/dL (ref 3.5–5.2)
Alkaline Phosphatase: 77 U/L (ref 39–117)
BUN: 12 mg/dL (ref 6–23)
CO2: 30 mEq/L (ref 19–32)
Calcium: 10.7 mg/dL — ABNORMAL HIGH (ref 8.4–10.5)
Chloride: 106 mEq/L (ref 96–112)
Creat: 0.71 mg/dL (ref 0.50–1.10)
Glucose, Bld: 112 mg/dL — ABNORMAL HIGH (ref 70–99)
Potassium: 4.2 mEq/L (ref 3.5–5.3)
Sodium: 141 mEq/L (ref 135–145)
Total Bilirubin: 0.4 mg/dL (ref 0.3–1.2)
Total Protein: 6.7 g/dL (ref 6.0–8.3)

## 2012-06-22 LAB — TSH: TSH: 3.069 u[IU]/mL (ref 0.350–4.500)

## 2012-09-20 ENCOUNTER — Ambulatory Visit (INDEPENDENT_AMBULATORY_CARE_PROVIDER_SITE_OTHER): Payer: Medicare Other | Admitting: Family Medicine

## 2012-09-20 ENCOUNTER — Encounter: Payer: Self-pay | Admitting: Family Medicine

## 2012-09-20 ENCOUNTER — Other Ambulatory Visit (HOSPITAL_COMMUNITY): Payer: Self-pay | Admitting: Family Medicine

## 2012-09-20 ENCOUNTER — Ambulatory Visit (HOSPITAL_COMMUNITY)
Admission: RE | Admit: 2012-09-20 | Discharge: 2012-09-20 | Disposition: A | Discharge status: Home / Self Care | Payer: Medicare Other | Source: Ambulatory Visit | Attending: Family Medicine | Admitting: Family Medicine

## 2012-09-20 ENCOUNTER — Telehealth: Payer: Self-pay

## 2012-09-20 ENCOUNTER — Ambulatory Visit: Payer: Medicare Other

## 2012-09-20 ENCOUNTER — Other Ambulatory Visit: Payer: Self-pay | Admitting: Family Medicine

## 2012-09-20 VITALS — BP 140/74 | HR 69 | Temp 97.9°F | Resp 18 | Ht 64.0 in | Wt 223.0 lb

## 2012-09-20 DIAGNOSIS — R079 Chest pain, unspecified: Secondary | ICD-10-CM

## 2012-09-20 DIAGNOSIS — R490 Dysphonia: Secondary | ICD-10-CM | POA: Diagnosis not present

## 2012-09-20 DIAGNOSIS — M79609 Pain in unspecified limb: Secondary | ICD-10-CM | POA: Insufficient documentation

## 2012-09-20 DIAGNOSIS — M79662 Pain in left lower leg: Secondary | ICD-10-CM

## 2012-09-20 DIAGNOSIS — R7309 Other abnormal glucose: Secondary | ICD-10-CM | POA: Diagnosis not present

## 2012-09-20 DIAGNOSIS — M7989 Other specified soft tissue disorders: Secondary | ICD-10-CM

## 2012-09-20 DIAGNOSIS — M1710 Unilateral primary osteoarthritis, unspecified knee: Secondary | ICD-10-CM

## 2012-09-20 DIAGNOSIS — R0600 Dyspnea, unspecified: Secondary | ICD-10-CM

## 2012-09-20 LAB — COMPREHENSIVE METABOLIC PANEL
ALT: 17 U/L (ref 0–35)
AST: 14 U/L (ref 0–37)
Albumin: 4 g/dL (ref 3.5–5.2)
Alkaline Phosphatase: 77 U/L (ref 39–117)
BUN: 14 mg/dL (ref 6–23)
CO2: 26 mEq/L (ref 19–32)
Calcium: 10.1 mg/dL (ref 8.4–10.5)
Chloride: 104 mEq/L (ref 96–112)
Creat: 0.69 mg/dL (ref 0.50–1.10)
Glucose, Bld: 87 mg/dL (ref 70–99)
Potassium: 3.9 mEq/L (ref 3.5–5.3)
Sodium: 138 mEq/L (ref 135–145)
Total Bilirubin: 0.4 mg/dL (ref 0.3–1.2)
Total Protein: 6.3 g/dL (ref 6.0–8.3)

## 2012-09-20 MED ORDER — MELOXICAM 7.5 MG PO TABS: 7.5000 mg | ORAL_TABLET | Freq: Every day | ORAL | Status: DC

## 2012-09-20 NOTE — Progress Notes (Signed)
Venous Duplex Imaging - Limited Alicia Vasquez 

## 2012-09-20 NOTE — Telephone Encounter (Signed)
Spoke to pt. Advised Mobic is at the pharmacy waiting for her. She states she will pick it up in the morning. She understands that she needs to return to the office in 1-2 weeks.

## 2012-09-20 NOTE — Progress Notes (Signed)
72 yo retired woman with left calf pain and swelling for three weeks.  She also had several hours of bilateral chest pains 3 weeks ago.  Pain also around the knees.  These symptoms are associated with worsening dyspnea on exertion.  Patient has multiple other complaints:  Chronic hoarseness, left ear fluctuating hearing and fullness, h/o elevated calcium and glucose three months ago.  F/Hx:  Mother was nurse at the old [facility] who had unknown cause of death.  Objective:  NAD HEENT:  Normal TM's Neck:  No JVD or thyromegaly or thyroid nodules Chest:  Clear Heart:  Regular, I/VI systolic murmur Neck:  No bruits. Ext:  Trace edema left with indurated upper left calf muscle relative to right lower leg.  Some tenderness in mid belly of gastrocnemius. [facility] reading (PRIMARY) by  Dr. Milus Glazier:  Hazy bilateral lower lung densities, poor inspiration. Results for orders placed in visit on 06/21/12  TSH      Result Value Range   TSH 3.069  0.350 - 4.500 uIU/mL  COMPREHENSIVE METABOLIC PANEL      Result Value Range   Sodium 141  135 - 145 mEq/L   Potassium 4.2  3.5 - 5.3 mEq/L   Chloride 106  96 - 112 mEq/L   CO2 30  19 - 32 mEq/L   Glucose, Bld 112 (*) 70 - 99 mg/dL   BUN 12  6 - 23 mg/dL   Creat 4.54  0.98 - 1.19 mg/dL   Total Bilirubin 0.4  0.3 - 1.2 mg/dL   Alkaline Phosphatase 77  39 - 117 U/L   AST 16  0 - 37 U/L   ALT 21  0 - 35 U/L   Total Protein 6.7  6.0 - 8.3 g/dL   Albumin 4.3  3.5 - 5.2 g/dL   Calcium 14.7 (*) 8.4 - 10.5 mg/dL    Assessment:  Possible DVT needing urgent diagnostic workup Chronic hoarseness Left ear fullness and occasional dizziness Elevated calcium and glucose 3 months ago  Plan:  Urgent venous doppler We will follow up the hypercalcemia and hyperglycemia with a CMET The hoarseness may require ENT referral. Calf pain, left - Plan: Lower Extremity Arterial Doppler Left  Dyspnea  Chest pain  Hypercalcemia - Plan: Comprehensive metabolic  panel  Hoarseness - Plan: DG Chest 2 View   Signed, Elvina Sidle, MD

## 2012-09-20 NOTE — Telephone Encounter (Signed)
Called pt to advise of medication- left message.

## 2012-09-20 NOTE — Telephone Encounter (Signed)
Received call report from [facility] for doppler results which was negative for DVT and for superficial thrombus. Called Dr Milus Glazier and he asked that I call pt and advise her that test was negative and that he will call in some medication to pharmacy. Also he would like her to RTC in 1-2 weeks, sooner if worsens.  LMOM with all of this info but asked pt to CB to make sure she gets the message.

## 2012-09-20 NOTE — Telephone Encounter (Signed)
Patient understands directions to RTC. She would like a return call with the information as to which medication is at the pharmacy for her. Did not note any new med's listed at time of this phone call. Please advise.  Patient request lab results to be mailed to her home.

## 2012-09-20 NOTE — Telephone Encounter (Signed)
I would like to see patient in 1-2 weeks after she starts the Mobic (meloxicam)

## 2012-09-25 ENCOUNTER — Other Ambulatory Visit: Payer: Self-pay | Admitting: Radiology

## 2012-09-25 ENCOUNTER — Other Ambulatory Visit: Payer: Self-pay | Admitting: Physician Assistant

## 2012-09-25 DIAGNOSIS — M79605 Pain in left leg: Secondary | ICD-10-CM

## 2012-09-25 DIAGNOSIS — M79604 Pain in right leg: Secondary | ICD-10-CM

## 2012-09-28 ENCOUNTER — Ambulatory Visit
Admission: RE | Admit: 2012-09-28 | Discharge: 2012-09-28 | Disposition: A | Discharge status: Home / Self Care | Payer: Medicare Other | Source: Ambulatory Visit | Attending: Family Medicine | Admitting: Family Medicine

## 2012-09-28 DIAGNOSIS — M79604 Pain in right leg: Secondary | ICD-10-CM

## 2012-09-28 DIAGNOSIS — M79609 Pain in unspecified limb: Secondary | ICD-10-CM | POA: Diagnosis not present

## 2012-10-09 ENCOUNTER — Other Ambulatory Visit: Payer: Self-pay | Admitting: Family Medicine

## 2012-10-09 ENCOUNTER — Telehealth: Payer: Self-pay

## 2012-10-09 DIAGNOSIS — E039 Hypothyroidism, unspecified: Secondary | ICD-10-CM

## 2012-10-09 NOTE — Telephone Encounter (Signed)
Ask lab to add this on last panel or have patient return

## 2012-10-09 NOTE — Telephone Encounter (Signed)
PT STATES WE DID BLOOD WORK BUT FAILED TO DO A THYROID PANEL TEST. PLEASE CALL [phone]

## 2012-10-18 ENCOUNTER — Encounter: Payer: Self-pay | Admitting: Family Medicine

## 2012-10-18 ENCOUNTER — Ambulatory Visit (INDEPENDENT_AMBULATORY_CARE_PROVIDER_SITE_OTHER): Payer: Medicare Other | Admitting: Family Medicine

## 2012-10-18 VITALS — BP 146/70 | HR 86 | Temp 98.0°F | Resp 16 | Ht 64.0 in | Wt 227.0 lb

## 2012-10-18 DIAGNOSIS — M25559 Pain in unspecified hip: Secondary | ICD-10-CM | POA: Diagnosis not present

## 2012-10-18 DIAGNOSIS — R5381 Other malaise: Secondary | ICD-10-CM | POA: Diagnosis not present

## 2012-10-18 DIAGNOSIS — R5383 Other fatigue: Secondary | ICD-10-CM

## 2012-10-18 DIAGNOSIS — M25562 Pain in left knee: Secondary | ICD-10-CM

## 2012-10-18 LAB — CBC WITH DIFFERENTIAL/PLATELET
Basophils Absolute: 0.1 10*3/uL (ref 0.0–0.1)
Basophils Relative: 1 % (ref 0–1)
Eosinophils Absolute: 0.2 10*3/uL (ref 0.0–0.7)
Eosinophils Relative: 3 % (ref 0–5)
HCT: 42.3 % (ref 36.0–46.0)
Hemoglobin: 14.3 g/dL (ref 12.0–15.0)
Lymphocytes Relative: 30 % (ref 12–46)
Lymphs Abs: 2.2 10*3/uL (ref 0.7–4.0)
MCH: 31.3 pg (ref 26.0–34.0)
MCHC: 33.8 g/dL (ref 30.0–36.0)
MCV: 92.6 fL (ref 78.0–100.0)
Monocytes Absolute: 1.2 10*3/uL — ABNORMAL HIGH (ref 0.1–1.0)
Monocytes Relative: 17 % — ABNORMAL HIGH (ref 3–12)
Neutro Abs: 3.5 10*3/uL (ref 1.7–7.7)
Neutrophils Relative %: 49 % (ref 43–77)
Platelets: 274 10*3/uL (ref 150–400)
RBC: 4.57 MIL/uL (ref 3.87–5.11)
RDW: 14.1 % (ref 11.5–15.5)
WBC: 7.2 10*3/uL (ref 4.0–10.5)

## 2012-10-18 LAB — TSH: TSH: 2.487 u[IU]/mL (ref 0.350–4.500)

## 2012-10-18 LAB — POCT SEDIMENTATION RATE: POCT SED RATE: 9 mm/hr (ref 0–22)

## 2012-10-18 NOTE — Progress Notes (Signed)
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  She also has some hoarseness and cough.  She has had some elevated calciums in the past.  Objective: Results for orders placed in visit on 09/20/12  COMPREHENSIVE METABOLIC PANEL      Result Value Range   Sodium 138  135 - 145 mEq/L   Potassium 3.9  3.5 - 5.3 mEq/L   Chloride 104  96 - 112 mEq/L   CO2 26  19 - 32 mEq/L   Glucose, Bld 87  70 - 99 mg/dL   BUN 14  6 - 23 mg/dL   Creat 1.61  0.96 - 0.45 mg/dL   Total Bilirubin 0.4  0.3 - 1.2 mg/dL   Alkaline Phosphatase 77  39 - 117 U/L   AST 14  0 - 37 U/L   ALT 17  0 - 35 U/L   Total Protein 6.3  6.0 - 8.3 g/dL   Albumin 4.0  3.5 - 5.2 g/dL   Calcium 40.9  8.4 - 81.1 mg/dL       *RADIOLOGY REPORT*  Clinical Data: Hoarseness. Bilateral chest pain, shortness of  breath.  CHEST - 2 VIEW  Comparison: 06/21/2012  Findings: Mild peribronchial thickening. Heart is upper limits  normal in size. No confluent airspace opacity or effusion. No  acute bony abnormality.  IMPRESSION:  Bronchitic changes.  Original Report Authenticated By: Charlett Nose, M.D.   Left knee:  Mild soft tissue swelling with tenderness medial knee joint line and popliteal area Venous doppler reading:  Not available in EPIC  Assessment:  Suspect this must be an arthritis post injury.  Need to rule out parathyroid.  Plan:  Orthopedic referral Pain in joint, lower leg, left - Plan: POCT SEDIMENTATION RATE  Fatigue - Plan: TSH, CBC with Differential, POCT SEDIMENTATION RATE, PTH, Intact and Calcium  Hypercalcemia - Plan: PTH, Intact and Calcium  Signed, Elvina Sidle, MD

## 2012-10-19 LAB — PTH, INTACT AND CALCIUM
Calcium: 10 mg/dL (ref 8.4–10.5)
PTH: 64.1 pg/mL (ref 14.0–72.0)

## 2012-11-08 ENCOUNTER — Encounter: Payer: Self-pay | Admitting: Family Medicine

## 2012-11-21 ENCOUNTER — Ambulatory Visit: Payer: Medicare Other

## 2012-11-21 ENCOUNTER — Ambulatory Visit (INDEPENDENT_AMBULATORY_CARE_PROVIDER_SITE_OTHER): Payer: Medicare Other | Admitting: Family Medicine

## 2012-11-21 VITALS — BP 157/74 | HR 79 | Temp 97.9°F | Resp 18 | Wt 233.0 lb

## 2012-11-21 DIAGNOSIS — M25569 Pain in unspecified knee: Secondary | ICD-10-CM

## 2012-11-21 DIAGNOSIS — M25571 Pain in right ankle and joints of right foot: Secondary | ICD-10-CM

## 2012-11-21 DIAGNOSIS — M79609 Pain in unspecified limb: Secondary | ICD-10-CM

## 2012-11-21 DIAGNOSIS — M79671 Pain in right foot: Secondary | ICD-10-CM

## 2012-11-21 DIAGNOSIS — Z87898 Personal history of other specified conditions: Secondary | ICD-10-CM

## 2012-11-21 DIAGNOSIS — M25561 Pain in right knee: Secondary | ICD-10-CM

## 2012-11-21 DIAGNOSIS — M7989 Other specified soft tissue disorders: Secondary | ICD-10-CM

## 2012-11-21 DIAGNOSIS — M25579 Pain in unspecified ankle and joints of unspecified foot: Secondary | ICD-10-CM

## 2012-11-21 DIAGNOSIS — M79661 Pain in right lower leg: Secondary | ICD-10-CM
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
## 2012-11-21 NOTE — Patient Instructions (Signed)
We will check blood test for blood clots and may need ct scan of chest if this is elevated.  Ok to take advil for your knee, ice or heat and gentle stretches as tolerated.  Wear brace and cane if needed. recheck if ankle or foot pain worse. Recheck knee in 1 week. We will schedule  Vein study of your legs.  Return to the clinic or go to the nearest emergency room if any of your symptoms worsen or new symptoms occur.

## 2012-11-22 ENCOUNTER — Other Ambulatory Visit (HOSPITAL_COMMUNITY): Payer: Self-pay | Admitting: Vascular Surgery

## 2012-11-23 ENCOUNTER — Ambulatory Visit (HOSPITAL_COMMUNITY)
Admission: RE | Admit: 2012-11-23 | Discharge: 2012-11-23 | Disposition: A | Discharge status: Home / Self Care | Payer: Medicare Other | Source: Ambulatory Visit | Attending: Family Medicine | Admitting: Family Medicine

## 2012-11-23 ENCOUNTER — Ambulatory Visit (INDEPENDENT_AMBULATORY_CARE_PROVIDER_SITE_OTHER): Payer: Medicare Other | Admitting: Family Medicine

## 2012-11-23 ENCOUNTER — Encounter: Payer: Self-pay | Admitting: Family Medicine

## 2012-11-23 VITALS — BP 145/82 | HR 77 | Temp 98.0°F | Resp 18

## 2012-11-23 DIAGNOSIS — M79662 Pain in left lower leg: Secondary | ICD-10-CM

## 2012-11-23 DIAGNOSIS — M79661 Pain in right lower leg: Secondary | ICD-10-CM

## 2012-11-23 DIAGNOSIS — I82402 Acute embolism and thrombosis of unspecified deep veins of left lower extremity: Secondary | ICD-10-CM

## 2012-11-23 DIAGNOSIS — M7989 Other specified soft tissue disorders: Secondary | ICD-10-CM

## 2012-11-23 DIAGNOSIS — M79609 Pain in unspecified limb: Secondary | ICD-10-CM | POA: Diagnosis not present

## 2012-11-23 DIAGNOSIS — I82409 Acute embolism and thrombosis of unspecified deep veins of unspecified lower extremity: Secondary | ICD-10-CM

## 2012-11-23 DIAGNOSIS — Z87898 Personal history of other specified conditions: Secondary | ICD-10-CM

## 2012-11-23 MED ORDER — RIVAROXABAN 15 MG PO TABS: 15.0000 mg | ORAL_TABLET | Freq: Two times a day (BID) | ORAL | Status: DC

## 2012-11-23 NOTE — Patient Instructions (Signed)
Deep Vein Thrombosis A deep vein thrombosis (DVT) is a blood clot that develops in a deep vein. A DVT is a clot in the deep, larger veins of the leg, arm, or pelvis. These are more dangerous than clots that might form in veins near the surface of the body. A DVT can lead to complications if the clot breaks off and travels in the bloodstream to the lungs.  A DVT can damage the valves in your leg veins, so that instead of flowing upwards, the blood pools in the lower leg. This is called post-thrombotic syndrome, and can result in pain, swelling, discoloration, and sores on the leg. Once identified, a DVT can be treated. It can also be prevented in some circumstances. Once you have had a DVT, you may be at increased risk for a DVT in the future. CAUSES Blood clots form in a vein for different reasons. Usually several things contribute to blood clots. Contributing factors include:  The flow of blood slows down.  The inside of the vein is damaged in some way.  The person has a condition that makes blood clot more easily. Some people are more likely than others to develop blood clots. That is because they have more factors that make clots likely. These are called risk factors. Risk factors include:   Older age, especially over 75 years old.  Having a history of blood clots. This means you have had one before. Or, it means that someone else in your family has had blood clots. You may have a genetic tendency to form clots.  Having major or lengthy surgery. This is especially true for surgery on the hip, knee, or belly (abdomen). Hip surgery is particularly high risk.  Breaking a hip or leg.  Sitting or lying still for a long time. This includes long distance travel, paralysis, or recovery from an illness or surgery.  Cancer, or cancer treatment.  Having a long, thin tube (catheter) placed inside a vein during a medical procedure.  Being overweight (obese).  Pregnancy and childbirth. Hormone  changes make the blood clot more easily during pregnancy. The fetus puts pressure on the veins of the pelvis. There is also risk of injury to veins during delivery or a caesarean. The risk is at its highest just after childbirth.  Medicines with the female hormone estrogen. This includes birth control pills and hormone replacement therapy.  Smoking.  Other circulation or heart problems. SYMPTOMS When a clot forms, it can either partially or totally block the blood flow in that vein. Symptoms of a DVT can include:  Swelling of the leg or arm, especially if one side is much worse.  Warmth and redness of the leg or arm, especially if one side is much worse.  Pain in an arm or leg. If the clot is in the leg, symptoms may be more noticeable or worse when standing or walking. The symptoms of a DVT that has traveled to the lungs (pulmonary embolism, PE) usually start suddenly, and include:  Shortness of breath.  Coughing.  Coughing up blood or blood-tinged phlegm.  Chest pain. The chest pain is often worse with deep breaths.  Rapid heartbeat. Anyone with these symptoms should get emergency medical treatment right away. Call your local emergency services (911 in [place] if you have these symptoms. DIAGNOSIS If a DVT is suspected, your caregiver will take a full medical history and carry out a physical exam. Tests that also may be required include:  Blood tests, including studies of   the clotting properties of the blood.  Ultrasonography to see if you have clots in your legs or lungs.  X-rays to show the flow of blood when dye is injected into the veins (venography).  Studies of your lungs, if you have any chest symptoms. PREVENTION  Exercise the legs regularly. Take a brisk 30 minute walk every day.  Maintain a weight that is appropriate for your height.  Avoid sitting or lying in bed for long periods of time without moving your legs.  Women, particularly those over the age of 35,  should consider the risks and benefits of taking estrogen medicines, including birth control pills.  Do not smoke, especially if you take estrogen medicines.  Long distance travel can increase your risk of DVT. You should exercise your legs by walking or pumping the muscles every hour.  In-hospital prevention:  Many of the risk factors above relate to situations that exist with hospitalization, either for illness, injury, or elective surgery.  Your caregiver will assess you for the need for venous thromboembolism prophylaxis when you are admitted to the hospital. If you are having surgery, your surgeon will assess you the day of or day after surgery.  Prevention may include medical and nonmedical measures. TREATMENT Treatment for DVT helps prevent death and disability. The most common treatment for DVT is blood thinning (anticoagulant) medicine, which reduces the blood's tendency to clot. Anticoagulants can stop new blood clots from forming and old ones from growing. They cannot dissolve existing clots. Your body does this by itself over time. Anticoagulants can be given by mouth, by intravenous (IV) access, or by injection. Your caregiver will determine the best program for you.  Heparin or related medicines (low molecular weight heparin) are usually the first treatment for a blood clot. They act quickly. However, they cannot be taken orally.  Heparin can cause a fall in a component of blood that stops bleeding and forms blood clots (platelets). You will be monitored with blood tests to be sure this does not occur.  Warfarin is an anticoagulant that can be swallowed (taken orally). It takes a few days to start working, so usually heparin or related medicines are used in combination. Once warfarin is working, heparin is usually stopped.  Less commonly, clot dissolving drugs (thrombolytics) are used to dissolve a DVT. They carry a high risk of bleeding, so they are used mainly in severe cases,  where a life or limb is threatened.  Very rarely, a blood clot in the leg needs to be removed surgically.  If you are unable to take anticoagulants, your caregiver may arrange for you to have a filter placed in a main vein in your belly (abdomen). This filter prevents clots from traveling to your lungs. HOME CARE INSTRUCTIONS  Take all medicines prescribed by your caregiver. Follow the directions carefully.  Warfarin. Most people will continue taking warfarin after hospital discharge. Your caregiver will advise you on the length of treatment (usually 3 6 months, sometimes lifelong).  Too much and too little warfarin are both dangerous. Too much warfarin increases the risk of bleeding. Too little warfarin continues to allow the risk for blood clots. While taking warfarin, you will need to have regular blood tests to measure your blood clotting time. These blood tests usually include both the prothrombin time (PT) and international normalized ratio (INR) tests. The PT and INR results allow your caregiver to adjust your dose of warfarin. The dose can change for many reasons. It is critically important that   you take warfarin exactly as prescribed, and that you have your PT and INR levels drawn exactly as directed.  Many foods, especially foods high in vitamin K can interfere with warfarin and affect the PT and INR results. Foods high in vitamin K include spinach, kale, broccoli, cabbage, collard and turnip greens, brussels sprouts, peas, cauliflower, seaweed, and parsley as well as beef and pork liver, green tea, and soybean oil. You should eat a consistent amount of foods high in vitamin K. Avoid major changes in your diet, or notify your caregiver before changing your diet. Arrange a visit with a dietitian to answer your questions.  Many medicines can interfere with warfarin and affect the PT and INR results. You must tell your caregiver about any and all medicines you take, this includes all vitamins  and supplements. Be especially cautious with aspirin and anti-inflammatory medicines. Ask your caregiver before taking these. Do not take or discontinue any prescribed or over-the-counter medicine except on the advice of your caregiver or pharmacist.  Warfarin can have side effects, primarily excessive bruising or bleeding. You will need to hold pressure over cuts for longer than usual. Your caregiver or pharmacist will discuss other potential side effects.  Alcohol can change the body's ability to handle warfarin. It is best to avoid alcoholic drinks or consume only very small amounts while taking warfarin. Notify your caregiver if you change your alcohol intake.  Notify your dentist or other caregivers before procedures.  Activity. Ask your caregiver how soon you can go back to normal activities. It is important to stay active to prevent blood clots. If you are on anticoagulant medicine, avoid contact sports.  Exercise. It is very important to exercise. This is especially important while traveling, sitting or standing for long periods of time. Exercise your legs by walking or by pumping the muscles frequently. Take frequent walks.  Compression stockings. These are tight elastic stockings that apply pressure to the lower legs. This pressure can help keep the blood in the legs from clotting. You may need to wear compressions stockings at home to help prevent a DVT.  Smoking. If you smoke, quit. Ask your caregiver for help with quitting smoking.  Learn as much as you can about DVT. Knowing more about the condition should help you keep it from coming back.  Wear a medical alert bracelet or carry a medical alert card. SEEK MEDICAL CARE IF:  You notice a rapid heartbeat.  You feel weaker or more tired than usual.  You feel faint.  You notice increased bruising.  You feel your symptoms are not getting better in the time expected.  You believe you are having side effects of medicine. SEEK  IMMEDIATE MEDICAL CARE IF:  You have chest pain.  You have trouble breathing.  You have new or increased swelling or pain in one leg.  You cough up blood.  You notice blood in vomit, in a bowel movement, or in urine. MAKE SURE YOU:  Understand these instructions.  Will watch your condition.  Will get help right away if you are not doing well or get worse. Document Released: 05/16/2005 Document Revised: 02/08/2012 Document Reviewed: 07/08/2010 [employer] Patient Information 2014 [employer].  

## 2012-11-23 NOTE — Progress Notes (Signed)
*  Preliminary Results* Bilateral lower extremity venous duplex completed. The left lower extremity is positive for deep vein thrombosis involving the left posterior tibial veins. There is no obvious evidence of right lower extremity deep vein thrombosis. No evidence of Baker's cyst bilaterally.  Preliminary results discussed with Magda Paganini of Dr.Greene's office.  11/23/2012 12:28 PM Gertie Fey, RVT, RDCS, RDMS

## 2012-11-23 NOTE — Progress Notes (Signed)
History of Present Illness   Patient Identification Alicia Vasquez is a 72 y.o. female.  Patient information was obtained from patient. History/Exam limitations: none. Patient presented to the Emergency Department by private vehicle.  Chief Complaint  Follow-up   This patient presents complaining of pain to the left lower extremity. Onset of symptoms was gradual starting 1 week ago and has been gradually worsening since that time. The patient does not recall history of trauma to the area. The patient denies being on a recent long auto or plane trip. The patient has not had a history of blood clots or coagulopathy. There is not a history of recent surgery. The patient denies chest pain, shortness of breath and cough.  Past Medical History  Diagnosis Date  . Allergy   . Hypertension   . Thyroid disease   . Blood transfusion without reported diagnosis    Family History  Problem Relation Age of Onset  . Cancer Brother    Scheduled Meds: . albuterol  2.5 mg Nebulization Once  . ipratropium  0.5 mg Nebulization Once   Continuous Infusions:  PRN Meds:  Allergies  Allergen Reactions  . Anesthetics, Amide Other (See Comments)    ELEVATE BLOOD PRESSURE  . Benadryl (Diphenhydramine Hcl)   . Carbocaine (Mepivacaine Hcl)   . Codeine   . Epinephrine   . Penicillins   . Sulfa Antibiotics   . Vicodin (Hydrocodone-Acetaminophen)    History   Social History  . Marital Status: Single    Spouse Name: N/A    Number of Children: N/A  . Years of Education: N/A   Occupational History  . Not on file.   Social History Main Topics  . Smoking status: Never Smoker   . Smokeless tobacco: Not on file  . Alcohol Use: No  . Drug Use: No  . Sexually Active: No   Other Topics Concern  . Not on file   Social History Narrative   Exercise yard work   Review of Systems Pertinent items are noted in HPI.   Physical Exam   BP 145/82  Pulse 77  Temp(Src) 98 F (36.7 C) (Oral)  Resp  18 BP 145/82  Pulse 77  Temp(Src) 98 F (36.7 C) (Oral)  Resp 18  General Appearance:    Alert, cooperative, no distress, appears stated age  Head:    Normocephalic, without obvious abnormality, atraumatic  Eyes:    PERRL, conjunctiva/corneas clear, EOM's intact, fundi    benign, both eyes  Ears:    Normal TM's and external ear canals, both ears  Nose:   Nares normal, septum midline, mucosa normal, no drainage    or sinus tenderness  Throat:   Lips, mucosa, and tongue normal; teeth and gums normal  Neck:   Supple, symmetrical, trachea midline, no adenopathy;    thyroid:  no enlargement/tenderness/nodules; no carotid   bruit or JVD  Back:     Symmetric, no curvature, ROM normal, no CVA tenderness  Lungs:     Clear to auscultation bilaterally, respirations unlabored  Chest Wall:    No tenderness or deformity   Heart:    Regular rate and rhythm, S1 and S2 normal, no murmur, rub   or gallop  Breast Exam:    No tenderness, masses, or nipple abnormality  Abdomen:     Soft, non-tender, bowel sounds active all four quadrants,    no masses, no organomegaly  Genitalia:    Normal female without lesion, discharge or tenderness  Rectal:  Normal tone, normal prostate, no masses or tenderness;   guaiac negative stool  Extremities:   Extremities normal, atraumatic, no cyanosis or edema  Pulses:   2+ and symmetric all extremities  Skin:   Skin color, texture, turgor normal, no rashes or lesions  Lymph nodes:   Cervical, supraclavicular, and axillary nodes normal  Neurologic:   CNII-XII intact, normal strength, sensation and reflexes    throughout    ED Course   Studies: Lab: None  Records Reviewed: Old medical records.  Treatments: None.  Consultations: Primary care physician consulted. Treatment options were discussed and plan of care agreed upon.  Disposition: Home Analgesics

## 2012-11-28 ENCOUNTER — Telehealth: Payer: Self-pay

## 2012-11-28 DIAGNOSIS — I82402 Acute embolism and thrombosis of unspecified deep veins of left lower extremity: Secondary | ICD-10-CM

## 2012-11-28 MED ORDER — RIVAROXABAN 15 MG PO TABS: 15.0000 mg | ORAL_TABLET | Freq: Two times a day (BID) | ORAL | Status: DC

## 2012-11-28 NOTE — Telephone Encounter (Signed)
Pt called while at pharmacy and reported that the #42 of Xarelto will cost her over $400 but that 30 day is free from company. I asked Lanora Manis who ok'd the #60 tabs and advised pt that she does need to f/up w/Dr L on the 15 th as planned and not take the medication for longer than the 21 days he Rxd w/out seeing him. Pt agreed.

## 2012-12-11 ENCOUNTER — Ambulatory Visit
Admission: RE | Admit: 2012-12-11 | Discharge: 2012-12-11 | Disposition: A | Discharge status: Home / Self Care | Payer: Medicare Other | Source: Ambulatory Visit | Attending: Family Medicine | Admitting: Family Medicine

## 2012-12-11 ENCOUNTER — Ambulatory Visit (INDEPENDENT_AMBULATORY_CARE_PROVIDER_SITE_OTHER): Payer: Medicare Other | Admitting: Family Medicine

## 2012-12-11 ENCOUNTER — Telehealth: Payer: Self-pay | Admitting: Radiology

## 2012-12-11 ENCOUNTER — Ambulatory Visit: Payer: Medicare Other

## 2012-12-11 VITALS — BP 158/90 | HR 81 | Temp 98.0°F | Resp 16

## 2012-12-11 DIAGNOSIS — I82402 Acute embolism and thrombosis of unspecified deep veins of left lower extremity: Secondary | ICD-10-CM

## 2012-12-11 DIAGNOSIS — I82409 Acute embolism and thrombosis of unspecified deep veins of unspecified lower extremity: Secondary | ICD-10-CM

## 2012-12-11 DIAGNOSIS — R079 Chest pain, unspecified: Secondary | ICD-10-CM

## 2012-12-11 DIAGNOSIS — R51 Headache: Secondary | ICD-10-CM

## 2012-12-11 LAB — POCT CBC
Granulocyte percent: 56.9 %G (ref 37–80)
HCT, POC: 45.9 % (ref 37.7–47.9)
Hemoglobin: 14.7 g/dL (ref 12.2–16.2)
Lymph, poc: 2.5 (ref 0.6–3.4)
MCH, POC: 31.7 pg — AB (ref 27–31.2)
MCHC: 32 g/dL (ref 31.8–35.4)
MCV: 99 fL — AB (ref 80–97)
MID (cbc): 1 — AB (ref 0–0.9)
MPV: 9.2 fL (ref 0–99.8)
POC Granulocyte: 4.6 (ref 2–6.9)
POC LYMPH PERCENT: 30.7 %L (ref 10–50)
POC MID %: 12.4 %M — AB (ref 0–12)
Platelet Count, POC: 260 10*3/uL (ref 142–424)
RBC: 4.64 M/uL (ref 4.04–5.48)
RDW, POC: 14 %
WBC: 8 10*3/uL (ref 4.6–10.2)

## 2012-12-11 LAB — POCT SEDIMENTATION RATE: POCT SED RATE: 8 mm/hr (ref 0–22)

## 2012-12-11 MED ORDER — IOHEXOL 350 MG/ML SOLN
125.0000 mL | Freq: Once | INTRAVENOUS | Status: AC | PRN
  Administered 2012-12-11: 125 mL via INTRAVENOUS

## 2012-12-11 MED ORDER — ENOXAPARIN SODIUM 120 MG/0.8ML ~~LOC~~ SOLN: 120.0000 mg | Freq: Every day | SUBCUTANEOUS | Status: DC

## 2012-12-11 MED ORDER — WARFARIN SODIUM 5 MG PO TABS: 5.0000 mg | ORAL_TABLET | Freq: Every day | ORAL | Status: DC

## 2012-12-11 MED ORDER — ENOXAPARIN SODIUM 120 MG/0.8ML ~~LOC~~ SOLN
120.0000 mg | Freq: Once | SUBCUTANEOUS | Status: AC
  Administered 2012-12-11: 120 mg via SUBCUTANEOUS

## 2012-12-11 NOTE — Progress Notes (Signed)
72 year old woman, previously married to a violinist, with recent bilateral leg pain diagnosed as DVT. She was started on Xarelto, about 20 days ago. Since that time, she initially felt much better but the leg pain never went away. Last week she had some significant episodes of chest pain lasting up to an hour. In addition her leg pain persists behind the left knee and extending into the proximal posterior thigh. She's had no edema of the leg. Her right leg also is sore where she felt that she strained it initially.  Patient is not short of breath and has no pleuritic chest pain at the present time.  In addition patient has had a left temporal pain with some tenderness which radiates to the left side. She's had no vision change.  Objective: No acute distress, alert and appropriate affect. HEENT unremarkable Heart: Regular, no murmur or gallop Chest is clear to auscultation Extremities: Mildly tender in the posterior left calf and popliteal area on the left. Right leg is essentially nontender  No edema, good pedal pulses, no redness or erythema on either leg.  [facility] reading (PRIMARY) by  Dr. Milus Glazier: Chest x-ray-.no acute findings.  Some chronic bronchial markings.  Results for orders placed in visit on 11/21/12  D-DIMER, QUANTITATIVE      Result Value Range   D-Dimer, Quant 0.43  0.00 - 0.48 ug/mL-FEU   Assessment: Unconcerned that the patient's increasing pain in her legs combined with the recent chest pain represents persistent DVT associated with pulmonary emboli.  Plan: CT angiogram, Lovenox 120 subcutaneous and start Coumadin. Stop Xarelto  Signed,  Elvina Sidle, MD

## 2012-12-11 NOTE — Telephone Encounter (Signed)
CT scan negative for pulmonary embolism. She is coming back for scan review.

## 2012-12-11 NOTE — Patient Instructions (Addendum)
You appear to have persisting clots in her legs with some fragments traveling up the veins anterior lungs. Were going to get a CAT scan to verify this, but in the meantime we will change her anticoagulation from Xarelto (stop this).  Instead, I want you to start Coumadin 5 mg daily and one shot of Lovenox daily.  Return on Saturday for protime to monitor anticoagulation

## 2012-12-12 ENCOUNTER — Telehealth: Payer: Self-pay

## 2012-12-12 DIAGNOSIS — I82402 Acute embolism and thrombosis of unspecified deep veins of left lower extremity: Secondary | ICD-10-CM

## 2012-12-12 DIAGNOSIS — R079 Chest pain, unspecified: Secondary | ICD-10-CM

## 2012-12-12 LAB — FACTOR 5 LEIDEN

## 2012-12-12 MED ORDER — ENOXAPARIN SODIUM 120 MG/0.8ML ~~LOC~~ SOLN: 120.0000 mg | Freq: Every day | SUBCUTANEOUS | Status: DC

## 2012-12-12 NOTE — Telephone Encounter (Signed)
I need the result of her protime.  If INR > 2.5, no more lovenox.  Otherwise, one shot daily until INR is therapeutic

## 2012-12-12 NOTE — Telephone Encounter (Signed)
I do not see where you ordered this? Did she have drawn elsewhere?

## 2012-12-12 NOTE — Telephone Encounter (Signed)
Patient says that her pharmacy cannot give her the shot she need until after 5? She wants to know if she can come in here and get that done today earlier than 5? Please call her at [phone]

## 2012-12-12 NOTE — Telephone Encounter (Signed)
When do you want her to have the Lovenox?

## 2012-12-13 NOTE — Telephone Encounter (Signed)
My mistake.  She needs to take the Lovenox once daily as written, stop the Xarelto, take the coumadin 5 mg daily, and recheck the protime 5 days after starting the coumadin

## 2012-12-14 NOTE — Telephone Encounter (Signed)
Patient advised to come in for labs  

## 2012-12-14 NOTE — Telephone Encounter (Signed)
Saturday patient will be here for the labs. To you FYI

## 2012-12-15 ENCOUNTER — Other Ambulatory Visit (INDEPENDENT_AMBULATORY_CARE_PROVIDER_SITE_OTHER): Payer: Medicare Other | Admitting: Family Medicine

## 2012-12-15 ENCOUNTER — Telehealth: Payer: Self-pay | Admitting: Radiology

## 2012-12-15 ENCOUNTER — Other Ambulatory Visit: Payer: Self-pay | Admitting: Family Medicine

## 2012-12-15 VITALS — BP 110/76 | HR 80 | Temp 97.7°F | Resp 16 | Ht 64.5 in | Wt 234.2 lb

## 2012-12-15 DIAGNOSIS — I82409 Acute embolism and thrombosis of unspecified deep veins of unspecified lower extremity: Secondary | ICD-10-CM

## 2012-12-15 DIAGNOSIS — I82403 Acute embolism and thrombosis of unspecified deep veins of lower extremity, bilateral: Secondary | ICD-10-CM

## 2012-12-15 LAB — PROTIME-INR
INR: 1.06 (ref <1.50)
Prothrombin Time: 13.8 seconds (ref 11.6–15.2)

## 2012-12-15 NOTE — Telephone Encounter (Signed)
Spoke with patient per Dr Milus Glazier: Double coumadin (2 pills a day) and continue shot. Recheck with Dr Milus Glazier on Wednesday 12/19/12.  Patient understands

## 2012-12-15 NOTE — Progress Notes (Addendum)
72 yo woman with bilateral DVT;s, worse on the left.  Recent CT was negative for clots (PE).  She continues to have left popliteal tenderness and swelling Now on Day 5 with the Coumadin and Lovenox 120 qd, since patient's symptoms were continuing on the Xarelto.  Objective:  NAD Multiple ecchymoses on arms and abdomen Tender, mildly swollen left popliteal area Given 120 mg Lovenox right mid abdomen sub Q  PT/INR:  1.06  Plan: double the coumadin dose and recheck in 4 days (Wednesday).  Dose to be adjusted then. Continue the Lovenox 120 mg SQ daily  K. Anjelo Pullman

## 2012-12-19 ENCOUNTER — Ambulatory Visit (INDEPENDENT_AMBULATORY_CARE_PROVIDER_SITE_OTHER): Payer: Medicare Other | Admitting: Emergency Medicine

## 2012-12-19 VITALS — BP 112/68 | HR 70 | Temp 98.4°F | Resp 18 | Wt 234.0 lb

## 2012-12-19 DIAGNOSIS — Z7901 Long term (current) use of anticoagulants: Secondary | ICD-10-CM | POA: Diagnosis not present

## 2012-12-19 DIAGNOSIS — Z5181 Encounter for therapeutic drug level monitoring: Secondary | ICD-10-CM | POA: Diagnosis not present

## 2012-12-19 DIAGNOSIS — R079 Chest pain, unspecified: Secondary | ICD-10-CM | POA: Diagnosis not present

## 2012-12-19 DIAGNOSIS — D6859 Other primary thrombophilia: Secondary | ICD-10-CM | POA: Insufficient documentation

## 2012-12-19 DIAGNOSIS — I82409 Acute embolism and thrombosis of unspecified deep veins of unspecified lower extremity: Secondary | ICD-10-CM | POA: Diagnosis not present

## 2012-12-19 DIAGNOSIS — I82402 Acute embolism and thrombosis of unspecified deep veins of left lower extremity: Secondary | ICD-10-CM

## 2012-12-19 LAB — PROTIME-INR: INR: 1.52 — ABNORMAL HIGH (ref <1.50)

## 2012-12-19 MED ORDER — ENOXAPARIN SODIUM 120 MG/0.8ML ~~LOC~~ SOLN
120.0000 mg | Freq: Once | SUBCUTANEOUS | Status: AC
  Administered 2012-12-19: 120 mg via SUBCUTANEOUS

## 2012-12-19 MED ORDER — ENOXAPARIN SODIUM 150 MG/ML ~~LOC~~ SOLN: 1.5000 mg/kg | Freq: Every day | SUBCUTANEOUS | Status: DC

## 2012-12-19 NOTE — Progress Notes (Signed)
  Subjective:    Patient ID: Alicia Vasquez, female    DOB: 10-02-1940, 72 y.o.   MRN: [id number]  HPI presents today for recheck. Positive DVT 11/23/12. Was started on Xarelto and Lovenox. Xarelto caused CP so d/c and started on Coumadin 5 mg. Has been taking once daily but had PT/INR Saturday PT 13.8 INR 1.06 and was told to increase Coumadin twice daily. Recheck PT/INR Wednesday.    Review of Systems     Objective:   Physical Exam alert and cooperative she is not in any distress. Her neck is supple. Her chest is clear to auscultation and percussion        Assessment & Plan:  Patient given 120 Lovenox. She currently is on Coumadin 10 mg daily. Stat PT/INR was done. We'll try and get her established at a Coumadin clinic.

## 2012-12-20 ENCOUNTER — Other Ambulatory Visit: Payer: Self-pay | Admitting: Radiology

## 2012-12-20 ENCOUNTER — Telehealth: Payer: Self-pay

## 2012-12-20 DIAGNOSIS — I82402 Acute embolism and thrombosis of unspecified deep veins of left lower extremity: Secondary | ICD-10-CM

## 2012-12-20 MED ORDER — ENOXAPARIN SODIUM 150 MG/ML ~~LOC~~ SOLN: 150.0000 mg | Freq: Every day | SUBCUTANEOUS | Status: DC

## 2012-12-20 NOTE — Telephone Encounter (Signed)
Pharmacy called to verify dose on lovenox

## 2012-12-20 NOTE — Telephone Encounter (Signed)
[address], [place]

## 2012-12-20 NOTE — Telephone Encounter (Signed)
Patient advised she will be worked in with Publishing rights manager, please help me , where are you located? Patient can come in tomorrow at 2:45 I need to give her directions.

## 2012-12-20 NOTE — Telephone Encounter (Signed)
Patient calling for her labwork results and referral for DVT.    [phone]

## 2012-12-20 NOTE — Telephone Encounter (Signed)
Pharmacist called and reported that the Rx for Lovenox 160 mg QD would have to be ordered as two 80 mg syringes, or we could order one 150 mg syringe QD. I checked with Dr Cleta Alberts and he changed dose to 150 mg inj QD. I will change in system and contact pharmacist.

## 2012-12-20 NOTE — Telephone Encounter (Signed)
Notes Recorded by Collene Gobble, MD on 12/20/2012 at 9:50 AM Call continue lovenox. No change in coumadin dose. I am trying to get her to a coumadin clinic. I did increase her lovenox dose slightly. Her INR is 1.52. See me Sat morning. Called to advise.

## 2012-12-20 NOTE — Telephone Encounter (Signed)
Thanks I have called to give her this information.

## 2012-12-21 ENCOUNTER — Ambulatory Visit (INDEPENDENT_AMBULATORY_CARE_PROVIDER_SITE_OTHER): Payer: Medicare Other | Admitting: General Practice

## 2012-12-21 ENCOUNTER — Ambulatory Visit (INDEPENDENT_AMBULATORY_CARE_PROVIDER_SITE_OTHER): Payer: Medicare Other | Admitting: Internal Medicine

## 2012-12-21 VITALS — BP 154/88 | HR 83 | Temp 98.0°F | Wt 232.0 lb

## 2012-12-21 DIAGNOSIS — I10 Essential (primary) hypertension: Secondary | ICD-10-CM

## 2012-12-21 DIAGNOSIS — Z7901 Long term (current) use of anticoagulants: Secondary | ICD-10-CM | POA: Diagnosis not present

## 2012-12-21 DIAGNOSIS — I82402 Acute embolism and thrombosis of unspecified deep veins of left lower extremity: Secondary | ICD-10-CM

## 2012-12-21 DIAGNOSIS — I82409 Acute embolism and thrombosis of unspecified deep veins of unspecified lower extremity: Secondary | ICD-10-CM | POA: Diagnosis not present

## 2012-12-21 LAB — POCT INR: INR: 2.5

## 2012-12-21 NOTE — Patient Instructions (Signed)

## 2012-12-23 ENCOUNTER — Encounter: Payer: Self-pay | Admitting: Internal Medicine

## 2012-12-23 NOTE — Assessment & Plan Note (Signed)
Continue current meds Taken to [facility] to be set up with coumadin clinic

## 2012-12-23 NOTE — Assessment & Plan Note (Signed)
Elevated today but controlled at home per pt Continue to monitor Continue current therapy at this time

## 2012-12-23 NOTE — Progress Notes (Signed)
HPI  Pt presents to the clinic today to establish care. She does not have a PCP. She only goes to urgent care as needed. She was recently diagnosed with a DVT in her leg. She was subsequently started on Xarelto. She ended up having continued pain in her leg as well as chest pain that developed. CT chest was negative for PE. She was then swiched to a lovenox bridge to coumadin. Her main concern is that she needs to establish today with the coumadin clinic.  Flu: never Tetanus: 2010 Pneumovax: 2013 Colonoscopy: never Pap smear: 2012 Mammogram: 2014 Eye doctor: as needed Dentist: as needed  Past Medical History  Diagnosis Date  . Allergy   . Hypertension   . Thyroid disease   . Blood transfusion without reported diagnosis     Current Outpatient Prescriptions  Medication Sig Dispense Refill  . albuterol (PROVENTIL HFA;VENTOLIN HFA) 108 (90 BASE) MCG/ACT inhaler Inhale 2 puffs into the lungs every 6 (six) hours as needed.  1 Inhaler  0  . enoxaparin (LOVENOX) 150 MG/ML injection Inject 1 mL (150 mg total) into the skin daily.  10 Syringe  1  . lisinopril-hydrochlorothiazide (PRINZIDE,ZESTORETIC) 10-12.5 MG per tablet Take 1 tablet by mouth daily.  90 tablet  3  . SYNTHROID 100 MCG tablet Take 1 tablet (100 mcg total) by mouth daily.  90 tablet  3  . warfarin (COUMADIN) 5 MG tablet Take 1 tablet (5 mg total) by mouth daily.  30 tablet  3  . fluticasone (FLONASE) 50 MCG/ACT nasal spray Place 2 sprays into the nose daily.  1 g  6   Current Facility-Administered Medications  Medication Dose Route Frequency Provider Last Rate Last Dose  . ipratropium (ATROVENT) nebulizer solution 0.5 mg  0.5 mg Nebulization Once Dois Davenport, MD        Allergies  Allergen Reactions  . Anesthetics, Amide Other (See Comments)    ELEVATE BLOOD PRESSURE  . Benadryl (Diphenhydramine Hcl)   . Carbocaine (Mepivacaine Hcl)   . Codeine   . Epinephrine   . Penicillins   . Sulfa Antibiotics   . Vicodin  (Hydrocodone-Acetaminophen)     Family History  Problem Relation Age of Onset  . Cancer Brother     History   Social History  . Marital Status: Single    Spouse Name: N/A    Number of Children: N/A  . Years of Education: N/A   Occupational History  . Not on file.   Social History Main Topics  . Smoking status: Never Smoker   . Smokeless tobacco: Not on file  . Alcohol Use: No  . Drug Use: No  . Sexually Active: No   Other Topics Concern  . Not on file   Social History Narrative   Exercise yard work    ROS:  Constitutional: Denies fever, malaise, fatigue, headache or abrupt weight changes.  HEENT: Denies eye pain, eye redness, ear pain, ringing in the ears, wax buildup, runny nose, nasal congestion, bloody nose, or sore throat. Respiratory: Denies difficulty breathing, shortness of breath, cough or sputum production.   Cardiovascular: Denies chest pain, chest tightness, palpitations or swelling in the hands or feet.  Gastrointestinal: Denies abdominal pain, bloating, constipation, diarrhea or blood in the stool.  GU: Denies frequency, urgency, pain with urination, blood in urine, odor or discharge. Musculoskeletal: Denies decrease in range of motion, difficulty with gait, muscle pain or joint pain and swelling.  Skin: Pt reports bruising of stomach. Denies redness, rashes,  lesions or ulcercations.  Neurological: Denies dizziness, difficulty with memory, difficulty with speech or problems with balance and coordination.   No other specific complaints in a complete review of systems (except as listed in HPI above).  PE:  BP 154/88  Pulse 83  Temp(Src) 98 F (36.7 C)  Wt 232 lb (105.235 kg)  BMI 39.22 kg/m2  SpO2 97% Wt Readings from Last 3 Encounters:  12/23/12 232 lb (105.235 kg)  12/19/12 234 lb (106.142 kg)  12/15/12 234 lb 3.2 oz (106.232 kg)    General: Appears her stated age, obese but well developed, well nourished in NAD. Skin: bruising noted at  injections sites, soft, nontender. Otherwise CDI. HEENT: Head: normal shape and size; Eyes: sclera white, no icterus, conjunctiva pink, PERRLA and EOMs intact; Ears: Tm's gray and intact, normal light reflex; Nose: mucosa pink and moist, septum midline; Throat/Mouth: Teeth present, mucosa pink and moist, no lesions or ulcerations noted.  Neck: Normal range of motion. Neck supple, trachea midline. No massses, lumps or thyromegaly present.  Cardiovascular: Normal rate and rhythm. S1,S2 noted.  No murmur, rubs or gallops noted. No JVD or BLE edema. No carotid bruits noted. Pulmonary/Chest: Normal effort and positive vesicular breath sounds. No respiratory distress. No wheezes, rales or ronchi noted.  Abdomen: Soft and nontender. Normal bowel sounds, no bruits noted. No distention or masses noted. Liver, spleen and kidneys non palpable. Musculoskeletal: Normal range of motion. No signs of joint swelling. No difficulty with gait.  Neurological: Alert and oriented. Cranial nerves II-XII intact. Coordination normal. +DTRs bilaterally. Psychiatric: Mood and affect normal. Behavior is normal. Judgment and thought content normal.   EKG:  BMET    Component Value Date/Time   NA 138 09/20/2012 1226   K 3.9 09/20/2012 1226   CL 104 09/20/2012 1226   CO2 26 09/20/2012 1226   GLUCOSE 87 09/20/2012 1226   BUN 14 09/20/2012 1226   CREATININE 0.69 09/20/2012 1226   CALCIUM 10.0 10/18/2012 1208    Lipid Panel  No results found for this basename: chol, trig, hdl, cholhdl, vldl, ldlcalc    CBC    Component Value Date/Time   WBC 8.0 12/11/2012 1201   WBC 7.2 10/18/2012 1208   RBC 4.64 12/11/2012 1201   RBC 4.57 10/18/2012 1208   HGB 14.7 12/11/2012 1201   HGB 14.3 10/18/2012 1208   HCT 45.9 12/11/2012 1201   HCT 42.3 10/18/2012 1208   PLT 274 10/18/2012 1208   MCV 99.0* 12/11/2012 1201   MCV 92.6 10/18/2012 1208   MCH 31.7* 12/11/2012 1201   MCH 31.3 10/18/2012 1208   MCHC 32.0 12/11/2012 1201   MCHC 33.8 10/18/2012  1208   RDW 14.1 10/18/2012 1208   LYMPHSABS 2.2 10/18/2012 1208   MONOABS 1.2* 10/18/2012 1208   EOSABS 0.2 10/18/2012 1208   BASOSABS 0.1 10/18/2012 1208    Hgb A1C No results found for this basename: HGBA1C     Assessment and Plan:  Preventative Health:  Encouraged opt to work on diet and exercise Pt declines setting up her colonoscopy at this time Encouraged pt to visit eye doctor and dentist yearly

## 2012-12-27 ENCOUNTER — Ambulatory Visit: Payer: Medicare Other | Admitting: Family Medicine

## 2012-12-28 ENCOUNTER — Ambulatory Visit (INDEPENDENT_AMBULATORY_CARE_PROVIDER_SITE_OTHER): Payer: Medicare Other | Admitting: General Practice

## 2012-12-28 DIAGNOSIS — Z7901 Long term (current) use of anticoagulants: Secondary | ICD-10-CM

## 2012-12-28 DIAGNOSIS — I82402 Acute embolism and thrombosis of unspecified deep veins of left lower extremity: Secondary | ICD-10-CM

## 2012-12-28 DIAGNOSIS — I82409 Acute embolism and thrombosis of unspecified deep veins of unspecified lower extremity: Secondary | ICD-10-CM

## 2012-12-28 LAB — POCT INR: INR: 3.4

## 2013-01-04 ENCOUNTER — Ambulatory Visit (INDEPENDENT_AMBULATORY_CARE_PROVIDER_SITE_OTHER): Payer: Medicare Other | Admitting: General Practice

## 2013-01-04 DIAGNOSIS — I82402 Acute embolism and thrombosis of unspecified deep veins of left lower extremity: Secondary | ICD-10-CM

## 2013-01-04 DIAGNOSIS — I82409 Acute embolism and thrombosis of unspecified deep veins of unspecified lower extremity: Secondary | ICD-10-CM | POA: Diagnosis not present

## 2013-01-11 ENCOUNTER — Ambulatory Visit (INDEPENDENT_AMBULATORY_CARE_PROVIDER_SITE_OTHER): Payer: Medicare Other | Admitting: General Practice

## 2013-01-11 DIAGNOSIS — I82409 Acute embolism and thrombosis of unspecified deep veins of unspecified lower extremity: Secondary | ICD-10-CM

## 2013-01-11 DIAGNOSIS — I82402 Acute embolism and thrombosis of unspecified deep veins of left lower extremity: Secondary | ICD-10-CM

## 2013-01-17 ENCOUNTER — Encounter: Payer: Self-pay | Admitting: Family Medicine

## 2013-01-17 ENCOUNTER — Ambulatory Visit (INDEPENDENT_AMBULATORY_CARE_PROVIDER_SITE_OTHER): Payer: Medicare Other | Admitting: Family Medicine

## 2013-01-17 VITALS — BP 116/67 | HR 89 | Temp 97.8°F | Resp 16 | Ht 64.25 in | Wt 232.4 lb

## 2013-01-17 DIAGNOSIS — I82402 Acute embolism and thrombosis of unspecified deep veins of left lower extremity: Secondary | ICD-10-CM

## 2013-01-17 DIAGNOSIS — R5381 Other malaise: Secondary | ICD-10-CM

## 2013-01-17 DIAGNOSIS — I82409 Acute embolism and thrombosis of unspecified deep veins of unspecified lower extremity: Secondary | ICD-10-CM

## 2013-01-17 DIAGNOSIS — K0510 Chronic gingivitis, plaque induced: Secondary | ICD-10-CM

## 2013-01-17 DIAGNOSIS — R079 Chest pain, unspecified: Secondary | ICD-10-CM

## 2013-01-17 DIAGNOSIS — E039 Hypothyroidism, unspecified: Secondary | ICD-10-CM

## 2013-01-17 DIAGNOSIS — I10 Essential (primary) hypertension: Secondary | ICD-10-CM | POA: Diagnosis not present

## 2013-01-17 DIAGNOSIS — R5383 Other fatigue: Secondary | ICD-10-CM

## 2013-01-17 LAB — CBC WITH DIFFERENTIAL/PLATELET
Basophils Absolute: 0.1 10*3/uL (ref 0.0–0.1)
Basophils Relative: 1 % (ref 0–1)
Eosinophils Absolute: 0.2 10*3/uL (ref 0.0–0.7)
Eosinophils Relative: 3 % (ref 0–5)
HCT: 42.2 % (ref 36.0–46.0)
Hemoglobin: 14.5 g/dL (ref 12.0–15.0)
Lymphocytes Relative: 30 % (ref 12–46)
Lymphs Abs: 2.4 10*3/uL (ref 0.7–4.0)
MCH: 32 pg (ref 26.0–34.0)
MCHC: 34.4 g/dL (ref 30.0–36.0)
MCV: 93.2 fL (ref 78.0–100.0)
Monocytes Absolute: 1.2 10*3/uL — ABNORMAL HIGH (ref 0.1–1.0)
Monocytes Relative: 15 % — ABNORMAL HIGH (ref 3–12)
Neutro Abs: 4.1 10*3/uL (ref 1.7–7.7)
Neutrophils Relative %: 51 % (ref 43–77)
Platelets: 296 10*3/uL (ref 150–400)
RBC: 4.53 MIL/uL (ref 3.87–5.11)
RDW: 14 % (ref 11.5–15.5)
WBC: 8 10*3/uL (ref 4.0–10.5)

## 2013-01-17 MED ORDER — SYNTHROID 100 MCG PO TABS: 100.0000 ug | ORAL_TABLET | Freq: Every day | ORAL | Status: DC

## 2013-01-17 MED ORDER — LISINOPRIL 10 MG PO TABS: 10.0000 mg | ORAL_TABLET | Freq: Every day | ORAL | Status: DC

## 2013-01-17 MED ORDER — CLINDAMYCIN HCL 150 MG PO CAPS: 150.0000 mg | ORAL_CAPSULE | Freq: Three times a day (TID) | ORAL | Status: DC

## 2013-01-17 MED ORDER — WARFARIN SODIUM 10 MG PO TABS: ORAL_TABLET | ORAL | Status: DC

## 2013-01-17 NOTE — Progress Notes (Signed)
72 yo woman with DVT whose last INR was 4.4 one week ago.  She is taking 10 mg M,W,F and 7.5 mg T,Th, Sat, and Sunday.  Still having chest pains and DOE.  Patient has no energy and complains of left leg pain.  Objective:  NAD Chest:  Clear Heart:  Reg, no murmur Abdomen:  Soft, nontender, small nodule left lower quadrant. Extrem:  Mildly tender calf with positive SLR HEENT:  Positive for gingivitis with vermillion line around junction of enamel of teeth.  Assessment: Patient had a wildly fluctuating pro time. Some of this may be related to having difficulty sticking with varying doses during the week. I'm concerned about her shortness of breath on exertion and chest pains. We've already done a CT scan which did not show any pulmonary emboli or other explanation for the shortness of breath. The lack of energy may be related to low potassium. Her TSH was just checked in May so I think that hypothyroidism is not responsible for this.  Plan: Fatigue - Plan: CBC with Differential, Comprehensive metabolic panel  Gingivitis - Plan: clindamycin (CLEOCIN) 150 MG capsule, Ambulatory referral to Cardiology  Hypothyroid - Plan: SYNTHROID 100 MCG tablet, DISCONTINUED: SYNTHROID 100 MCG tablet, DISCONTINUED: SYNTHROID 100 MCG tablet  DVT (deep venous thrombosis), left - Plan: warfarin (COUMADIN) 10 MG tablet, Protime-INR  Chest pain  Hypertension - Plan: lisinopril (PRINIVIL,ZESTRIL) 10 MG tablet, Comprehensive metabolic panel  Signed, Elvina Sidle, MD

## 2013-01-18 ENCOUNTER — Telehealth: Payer: Self-pay

## 2013-01-18 LAB — COMPREHENSIVE METABOLIC PANEL
ALT: 19 U/L (ref 0–35)
AST: 15 U/L (ref 0–37)
Albumin: 4.3 g/dL (ref 3.5–5.2)
Alkaline Phosphatase: 72 U/L (ref 39–117)
BUN: 13 mg/dL (ref 6–23)
CO2: 26 mEq/L (ref 19–32)
Calcium: 10.3 mg/dL (ref 8.4–10.5)
Chloride: 102 mEq/L (ref 96–112)
Creat: 0.74 mg/dL (ref 0.50–1.10)
Glucose, Bld: 86 mg/dL (ref 70–99)
Potassium: 4.2 mEq/L (ref 3.5–5.3)
Sodium: 139 mEq/L (ref 135–145)
Total Bilirubin: 0.4 mg/dL (ref 0.3–1.2)
Total Protein: 6.6 g/dL (ref 6.0–8.3)

## 2013-01-18 NOTE — Telephone Encounter (Signed)
Dr. Elbert Ewings, your note says take change to 10 mg and the rx is for 10mg  qd except Sunday. Pt is concerned because her INR was 4.4 and she says that you 2 discussed being on 7.5 mg. Please verify. Also pt says that she was on lisinopril/hct, but you only sent in lisinopril. Were you wanting to stop the HCT?

## 2013-01-18 NOTE — Telephone Encounter (Signed)
Patient calling to ask if Dr. Milus Glazier accidentally wrote the wrong RX for her warfarin (COUMADIN) 10 MG tablet she says they discussed on taking 7.5 mg not the 10mg . She says she has to cut it up in small pieces to take now. Patient wants to double check that if this is correct. Thank you! Patient saw him yesterday at appointment center.    Best:  [phone]

## 2013-01-19 ENCOUNTER — Telehealth: Payer: Self-pay | Admitting: Family Medicine

## 2013-01-19 NOTE — Telephone Encounter (Signed)
Spoke with patient and advised her to hold the coumadin until Monday and to return back on Monday to recheck INR and to bring in all her medicine patient understands

## 2013-01-19 NOTE — Telephone Encounter (Signed)
Hold the coumadin until Monday.  Then come in for INR and bring in all medications.

## 2013-01-21 ENCOUNTER — Ambulatory Visit (INDEPENDENT_AMBULATORY_CARE_PROVIDER_SITE_OTHER): Payer: Medicare Other | Admitting: Family Medicine

## 2013-01-21 ENCOUNTER — Telehealth: Payer: Self-pay | Admitting: Radiology

## 2013-01-21 VITALS — BP 140/72 | HR 95 | Temp 97.9°F | Resp 17 | Ht 64.25 in | Wt 235.0 lb

## 2013-01-21 DIAGNOSIS — I82409 Acute embolism and thrombosis of unspecified deep veins of unspecified lower extremity: Secondary | ICD-10-CM

## 2013-01-21 DIAGNOSIS — I82402 Acute embolism and thrombosis of unspecified deep veins of left lower extremity: Secondary | ICD-10-CM

## 2013-01-21 DIAGNOSIS — R350 Frequency of micturition: Secondary | ICD-10-CM | POA: Diagnosis not present

## 2013-01-21 LAB — POCT URINALYSIS DIPSTICK
Bilirubin, UA: NEGATIVE
Blood, UA: NEGATIVE
Glucose, UA: NEGATIVE
Ketones, UA: NEGATIVE
Nitrite, UA: NEGATIVE
Protein, UA: NEGATIVE
Spec Grav, UA: 1.02
Urobilinogen, UA: 0.2
pH, UA: 7

## 2013-01-21 LAB — POCT UA - MICROSCOPIC ONLY
Casts, Ur, LPF, POC: NEGATIVE
Crystals, Ur, HPF, POC: NEGATIVE
Mucus, UA: NEGATIVE
RBC, urine, microscopic: NEGATIVE
Yeast, UA: NEGATIVE

## 2013-01-21 LAB — PROTIME-INR
INR: 1.23 (ref <1.50)
Prothrombin Time: 15.4 seconds — ABNORMAL HIGH (ref 11.6–15.2)

## 2013-01-21 MED ORDER — CIPROFLOXACIN HCL 250 MG PO TABS: 250.0000 mg | ORAL_TABLET | Freq: Two times a day (BID) | ORAL | Status: DC

## 2013-01-21 NOTE — Telephone Encounter (Signed)
Spoke to this patient. Gave her instructions about her coumadin: 10 mg on odd days, 5 mg even days  Recheck in two weeks here [facility]. Start tonight.
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
## 2013-01-21 NOTE — Telephone Encounter (Signed)
It is now Monday, what do you want her to do? Are you going to advise on her lisinopril/ hctz as well? I have advised her to come in today, she will come in today at 4pm.

## 2013-01-25 ENCOUNTER — Telehealth: Payer: Self-pay | Admitting: Internal Medicine

## 2013-01-25 ENCOUNTER — Ambulatory Visit (INDEPENDENT_AMBULATORY_CARE_PROVIDER_SITE_OTHER): Payer: Medicare Other | Admitting: General Practice

## 2013-01-25 DIAGNOSIS — I82409 Acute embolism and thrombosis of unspecified deep veins of unspecified lower extremity: Secondary | ICD-10-CM

## 2013-01-25 DIAGNOSIS — I82402 Acute embolism and thrombosis of unspecified deep veins of left lower extremity: Secondary | ICD-10-CM

## 2013-01-25 LAB — POCT INR: INR: 1.7

## 2013-01-25 MED ORDER — WARFARIN SODIUM 5 MG PO TABS: ORAL_TABLET | ORAL | Status: DC

## 2013-01-25 NOTE — Telephone Encounter (Signed)
Should be ok but she may want to have her INR checked at least once while she on it.

## 2013-01-25 NOTE — Telephone Encounter (Signed)
Pt was put on cipro for kidney infection, she wants to make sure this is ok with all her meds

## 2013-01-30 ENCOUNTER — Encounter: Payer: Self-pay | Admitting: Internal Medicine

## 2013-01-30 ENCOUNTER — Ambulatory Visit (INDEPENDENT_AMBULATORY_CARE_PROVIDER_SITE_OTHER): Payer: Medicare Other | Admitting: Internal Medicine

## 2013-01-30 VITALS — BP 140/82 | HR 98 | Temp 97.6°F | Wt 236.0 lb

## 2013-01-30 DIAGNOSIS — N3281 Overactive bladder: Secondary | ICD-10-CM

## 2013-01-30 DIAGNOSIS — I10 Essential (primary) hypertension: Secondary | ICD-10-CM | POA: Diagnosis not present

## 2013-01-30 DIAGNOSIS — J309 Allergic rhinitis, unspecified: Secondary | ICD-10-CM

## 2013-01-30 DIAGNOSIS — N318 Other neuromuscular dysfunction of bladder: Secondary | ICD-10-CM | POA: Diagnosis not present

## 2013-01-30 DIAGNOSIS — N390 Urinary tract infection, site not specified: Secondary | ICD-10-CM | POA: Diagnosis not present

## 2013-01-30 DIAGNOSIS — J302 Other seasonal allergic rhinitis: Secondary | ICD-10-CM

## 2013-01-30 LAB — POCT URINALYSIS DIPSTICK
Blood, UA: NEGATIVE
Nitrite, UA: NEGATIVE
Protein, UA: NEGATIVE
Spec Grav, UA: <=1.005
Urobilinogen, UA: NEGATIVE

## 2013-01-30 NOTE — Assessment & Plan Note (Addendum)
Get OTC claritin for symptoms

## 2013-01-30 NOTE — Assessment & Plan Note (Signed)
Will switch back lisinopril-HCTZ due to peripheral edema Monitor for s/s of swelling Monitor blood pressure

## 2013-01-30 NOTE — Assessment & Plan Note (Signed)
Urinary frequency and urgency Urinalysis shows no infection- so no antibiotic Frequent voidings

## 2013-01-30 NOTE — Patient Instructions (Addendum)
Overactive Bladder, Adult  The bladder has two functions that are totally opposite of the other. One is to relax and stretch out so it can store urine (fills like a balloon), and the other is to contract and squeeze down so that it can empty the urine that it has stored. Proper functioning of the bladder is a complex mixing of these two functions. The filling and emptying of the bladder can be influenced by:  · The bladder.  · The spinal cord.  · The brain.  · The nerves going to the bladder.  · Other organs that are closely related to the bladder such as prostate in males and the vagina in females.  As your bladder fills with urine, nerve signals are sent from the bladder to the brain to tell you that you may need to urinate. Normal urination requires that the bladder squeeze down with sufficient strength to empty the bladder, but this also requires that the bladder squeeze down sufficiently long to finish the job. In addition the sphincter muscles, which normally keep you from leaking urine, must also relax so that the urine can pass. Coordination between the bladder muscle squeezing down and the sphincter muscles relaxing is required to make everything happen normally.  With an overactive bladder sometimes the muscles of the bladder contract unexpectedly and involuntarily and this causes an urgent need to urinate. The normal response is to try to hold urine in by contracting the sphincter muscles. Sometimes the bladder contracts so strongly that the sphincter muscles cannot stop the urine from passing out and incontinence occurs. This kind of incontinence is called urge incontinence.  Having an overactive bladder can be embarrassing and awkward. It can keep you from living life the way you want to. Many people think it is just something you have to put up with as you grow older or have certain health conditions. In fact, there are treatments that can help make your life easier and more pleasant.  CAUSES   Many  things can cause an overactive bladder. Possibilities include:  · Urinary tract infection or infection of nearby tissues such as the prostate.  · Prostate enlargement.  · In women, multiple pregnancies or surgery on the uterus or urethra.  · Bladder stones, inflammation or tumors.  · Caffeine.  · Alcohol.  · Medications. For example, diuretics (drugs that help the body get rid of extra fluid) increase urine production. Some other medicines must be taken with lots of fluids.  · Muscle or nerve weakness. This might be the result of a spinal cord injury, a stroke, multiple sclerosis or Parkinson's disease.  · Diabetes can cause a high urine volume which fills the bladder so quickly that the normal urge to urinate is triggered very strongly.  SYMPTOMS   · Loss of bladder control. You feel the need to urinate and cannot make your body wait.  · Sudden, strong urges to urinate.  · Urinating 8 or more times a day.  · Waking up to urinate two or more times a night.  DIAGNOSIS   To decide if you have overactive bladder, your healthcare provider will probably:  · Ask about symptoms you have noticed.  · Ask about your overall health. This will include questions about any medications you are taking.  · Do a physical examination. This will help determine if there are obvious blockages or other problems.  · Order some tests. These might include:  · A blood test to check for diabetes or   other health issues that could be contributing to the problem.  · Urine testing. This could measure the flow of urine and the pressure on the bladder.  · A test of your neurological system (the brain, spinal cord and nerves). This is the system that senses the need to urinate. Some of these tests are called flow tests, bladder pressure tests and electrical measurements of the sphincter muscle.  · A bladder test to check whether it is emptying completely when you urinate.  · Cytoscopy. This test uses a thin tube with a tiny camera on it. It offers a  look inside your urethra and bladder to see if there are problems.  · Imaging tests. You might be given a contrast dye and then asked to urinate. X-rays are taken to see how your bladder is working.  TREATMENT   An overactive bladder can be treated in many ways. The treatment will depend on the cause. Whether you have a mild or severe case also makes a difference. Often, treatment can be given in your healthcare provider's office or clinic. Be sure to discuss the different options with your caregiver. They include:  · Behavioral treatments. These do not involve medication or surgery:  · Bladder training. For this, you would follow a schedule to urinate at regular intervals. This helps you learn to control the urge to urinate. At first, you might be asked to wait a few minutes after feeling the urge. In time, you should be able to schedule bathroom visits an hour or more apart.  · Kegel exercises. These exercises strengthen the pelvic floor muscles, which support the bladder. By toning these muscles, they can help control urination, even if the bladder muscles are overactive. A specialist will teach you how to do these exercises correctly. They will require daily practice.  · Weight loss. If you are obese or overweight, losing weight might stop your bladder from being overactive. Talk to your healthcare provider about how many pounds you should lose. Also ask if there is a specific program or method that would work best for you.  · Diet change. This might be suggested if constipation is making your overactive bladder worse. Your healthcare provider or a nutritionist can explain ways to change what you eat to ease constipation. Other people might need to take in less caffeine or alcohol. Sometimes drinking fewer fluids is needed, too.  · Protection. This is not an actual treatment. But, you could wear special pads to take care of any leakage while you wait for other treatments to take effect. This will help you avoid  embarrassment.  · Physical treatments.  · Electrical stimulation. Electrodes will send gentle pulses to the nerves or muscles that help control the bladder. The goal is to strengthen them. Sometimes this is done with the electrodes outside of the body. Or, they might be placed inside the body (implanted). This treatment can take several months to have an effect.  · Medications. These are usually used along with other treatments. Several medicines are available. Some are injected into the muscles involved in urination. Others come in pill form. Medications sometimes prescribed include:  · Anticholinergics. These drugs block the signals that the nerves deliver to the bladder. This keeps it from releasing urine at the wrong time. Researchers think the drugs might help in other ways, too.  · Imipramine. This is an antidepressant. But, it relaxes bladder muscles.  · Botox. This is still experimental. Some people believe that injecting it into the   bladder muscles will relax them so they work more normally. It has also been injected into the sphincter muscle when the sphincter muscle does not open properly. This is a temporary fix, however. Also, it might make matters worse, especially in older people.  · Surgery.  · A device might be implanted to help manage your nerves. It works on the nerves that signal when you need to urinate.  · Surgery is sometimes needed with electrical stimulation. If the electrodes are implanted, this is done through surgery.  · Sometimes repairs need to be made through surgery. For example, the size of the bladder can be changed. This is usually done in severe cases only.  HOME CARE INSTRUCTIONS   · Take any medications your healthcare provider prescribed or suggested. Follow the directions carefully.  · Practice any lifestyle changes that are recommended. These might include:  · Drinking less fluid or drinking at different times of the day. If you need to urinate often during the night, for  example, you may need to stop drinking fluids early in the evening.  · Cutting down on caffeine or alcohol. They can both make an overactive bladder worse. Caffeine is found in coffee, tea and sodas.  · Doing Kegel exercises to strengthen muscles.  · Losing weight, if that is recommended.  · Eating a healthy and balanced diet. This will help you avoid constipation.  · Keep a journal or a log. You might be asked to record how much you drink and when, and also when you feel the need to urinate.  · Learn how to care for implants or other devices, such as pessaries.  SEEK MEDICAL CARE IF:   · Your overactive bladder gets worse.  · You feel increased pain or irritation when you urinate.  · You notice blood in your urine.  · You have questions about any medications or devices that your healthcare provider recommended.  · You notice blood, pus or swelling at the site of any test or treatment procedure.  · You have an oral temperature above 102° F (38.9° C).  SEEK IMMEDIATE MEDICAL CARE IF:   You have an oral temperature above 102° F (38.9° C), not controlled by medicine.  Document Released: 03/12/2009 Document Revised: 08/08/2011 Document Reviewed: 03/12/2009  [employer]® Patient Information ©2014 [employer].

## 2013-01-30 NOTE — Progress Notes (Signed)
Subjective:    Patient ID: Alicia Vasquez, female    DOB: 10/20/1940, 72 y.o.   MRN: [id number]  HPI  Pt presents to the clinic today with c/o recurrent urinary symptoms. She was seen at urgent care on 8/25 and treated with 3 days of cipro. She stills has recurrent symptoms of urgency and frequency and would like to be rechecked for a UTI. Additionally, she reports that she was taken off her HCTZ on the 25th of August as well due to the urinary frequency. Since that time, she has noticed increased swelling in her feet. She thinks she needs to go back on the HCTZ. She c/o allergy symptoms. She does have a history of allergies. She is not taking an OTC allergy medication. She used to be on flonase but does not take it because she does not like it. She denies fever, chills, or body aches. She has not had sick contacts.  Review of Systems      Past Medical History  Diagnosis Date  . Allergy   . Hypertension   . Thyroid disease   . Blood transfusion without reported diagnosis     Current Outpatient Prescriptions  Medication Sig Dispense Refill  . albuterol (PROVENTIL HFA;VENTOLIN HFA) 108 (90 BASE) MCG/ACT inhaler Inhale 2 puffs into the lungs every 6 (six) hours as needed.  1 Inhaler  0  . clindamycin (CLEOCIN) 150 MG capsule Take 1 capsule (150 mg total) by mouth 3 (three) times daily.  15 capsule  0  . lisinopril (PRINIVIL,ZESTRIL) 10 MG tablet Take 1 tablet (10 mg total) by mouth daily.  90 tablet  3  . SYNTHROID 100 MCG tablet Take 1 tablet (100 mcg total) by mouth daily.  90 tablet  3  . warfarin (COUMADIN) 5 MG tablet Take as directed by anticoagulation clinic  30 tablet  2  . fluticasone (FLONASE) 50 MCG/ACT nasal spray Place 2 sprays into the nose daily.  1 g  6  . warfarin (COUMADIN) 10 MG tablet 1 tablet daily.  No coumadin on Sunday.  30 tablet  3   Current Facility-Administered Medications  Medication Dose Route Frequency Provider Last Rate Last Dose  . ipratropium  (ATROVENT) nebulizer solution 0.5 mg  0.5 mg Nebulization Once Dois Davenport, MD        Allergies  Allergen Reactions  . Anesthetics, Amide Other (See Comments)    ELEVATE BLOOD PRESSURE  . Benadryl [Diphenhydramine Hcl]   . Carbocaine [Mepivacaine Hcl]   . Codeine   . Epinephrine   . Penicillins   . Sulfa Antibiotics   . Vicodin [Hydrocodone-Acetaminophen]     Family History  Problem Relation Age of Onset  . Cancer Brother     History   Social History  . Marital Status: Single    Spouse Name: N/A    Number of Children: N/A  . Years of Education: N/A   Occupational History  . Not on file.   Social History Main Topics  . Smoking status: Never Smoker   . Smokeless tobacco: Not on file  . Alcohol Use: No  . Drug Use: No  . Sexual Activity: No   Other Topics Concern  . Not on file   Social History Narrative   Exercise yard work     Constitutional: Denies fever, malaise, fatigue, headache or abrupt weight changes.  HEENT: Denies eye pain, eye redness, ear pain, ringing in the ears, wax buildup, runny nose, nasal congestion, bloody nose, or sore throat. Respiratory:  Denies difficulty breathing, shortness of breath, cough or sputum production.   Cardiovascular: Pt reports peripheral edema. Denies chest pain, chest tightness, palpitations or swelling in the hands or feet.   GU: Pt reports urgency and frequency. Denies pain with urination, burning sensation, blood in urine, odor or discharge.   No other specific complaints in a complete review of systems (except as listed in HPI above).  Objective:   Physical Exam   BP 140/82  Pulse 98  Temp(Src) 97.6 F (36.4 C) (Oral)  Wt 236 lb (107.049 kg)  BMI 40.19 kg/m2  SpO2 97% Wt Readings from Last 3 Encounters:  01/30/13 236 lb (107.049 kg)  01/21/13 235 lb (106.595 kg)  01/17/13 232 lb 6.4 oz (105.416 kg)    General: Appears her stated age, well developed, well nourished in NAD. HEENT: Head: normal shape  and size; Eyes: sclera white, no icterus, conjunctiva pink, PERRLA and EOMs intact; Ears: Tm's gray and intact, normal light reflex; Nose: mucosaboggyand moist, septum midline; Throat/Mouth: Teeth present, mucosa pink and moist, no exudate, + PND, lesions or ulcerations noted.   Cardiovascular: Normal rate and rhythm. S1,S2 noted.  No murmur, rubs or gallops noted. No JVD or BLE edema. No carotid bruits noted. Pulmonary/Chest: Normal effort and positive vesicular breath sounds. No respiratory distress. No wheezes, rales or ronchi noted.  Abdomen: Soft and nontender. Normal bowel sounds, no bruits noted. No distention or masses noted. Liver, spleen and kidneys non palpable. No CVA tenderness.  BMET    Component Value Date/Time   NA 139 01/17/2013 1206   K 4.2 01/17/2013 1206   CL 102 01/17/2013 1206   CO2 26 01/17/2013 1206   GLUCOSE 86 01/17/2013 1206   BUN 13 01/17/2013 1206   CREATININE 0.74 01/17/2013 1206   CALCIUM 10.3 01/17/2013 1206    Lipid Panel  No results found for this basename: chol, trig, hdl, cholhdl, vldl, ldlcalc    CBC    Component Value Date/Time   WBC 8.0 01/17/2013 1206   WBC 8.0 12/11/2012 1201   RBC 4.53 01/17/2013 1206   RBC 4.64 12/11/2012 1201   HGB 14.5 01/17/2013 1206   HGB 14.7 12/11/2012 1201   HCT 42.2 01/17/2013 1206   HCT 45.9 12/11/2012 1201   PLT 296 01/17/2013 1206   MCV 93.2 01/17/2013 1206   MCV 99.0* 12/11/2012 1201   MCH 32.0 01/17/2013 1206   MCH 31.7* 12/11/2012 1201   MCHC 34.4 01/17/2013 1206   MCHC 32.0 12/11/2012 1201   RDW 14.0 01/17/2013 1206   LYMPHSABS 2.4 01/17/2013 1206   MONOABS 1.2* 01/17/2013 1206   EOSABS 0.2 01/17/2013 1206   BASOSABS 0.1 01/17/2013 1206    Hgb A1C No results found for this basename: HGBA1C        Assessment & Plan:

## 2013-02-01 ENCOUNTER — Ambulatory Visit (INDEPENDENT_AMBULATORY_CARE_PROVIDER_SITE_OTHER): Payer: Medicare Other | Admitting: General Practice

## 2013-02-01 DIAGNOSIS — I82402 Acute embolism and thrombosis of unspecified deep veins of left lower extremity: Secondary | ICD-10-CM

## 2013-02-01 DIAGNOSIS — I82409 Acute embolism and thrombosis of unspecified deep veins of unspecified lower extremity: Secondary | ICD-10-CM

## 2013-02-13 ENCOUNTER — Ambulatory Visit (INDEPENDENT_AMBULATORY_CARE_PROVIDER_SITE_OTHER): Payer: Medicare Other | Admitting: Family Medicine

## 2013-02-13 VITALS — BP 150/80 | HR 86 | Temp 97.5°F | Resp 16 | Ht 64.5 in | Wt 234.0 lb

## 2013-02-13 DIAGNOSIS — J309 Allergic rhinitis, unspecified: Secondary | ICD-10-CM | POA: Diagnosis not present

## 2013-02-13 DIAGNOSIS — H8110 Benign paroxysmal vertigo, unspecified ear: Secondary | ICD-10-CM | POA: Diagnosis not present

## 2013-02-13 DIAGNOSIS — R42 Dizziness and giddiness: Secondary | ICD-10-CM | POA: Diagnosis not present

## 2013-02-13 DIAGNOSIS — I82402 Acute embolism and thrombosis of unspecified deep veins of left lower extremity: Secondary | ICD-10-CM

## 2013-02-13 DIAGNOSIS — R079 Chest pain, unspecified: Secondary | ICD-10-CM | POA: Diagnosis not present

## 2013-02-13 DIAGNOSIS — I82409 Acute embolism and thrombosis of unspecified deep veins of unspecified lower extremity: Secondary | ICD-10-CM

## 2013-02-13 DIAGNOSIS — R350 Frequency of micturition: Secondary | ICD-10-CM | POA: Diagnosis not present

## 2013-02-13 LAB — POCT CBC
Granulocyte percent: 68 %G (ref 37–80)
HCT, POC: 43.4 % (ref 37.7–47.9)
MCV: 99 fL — AB (ref 80–97)
POC LYMPH PERCENT: 21.4 %L (ref 10–50)
RDW, POC: 14.7 %
WBC: 8.1 10*3/uL (ref 4.6–10.2)

## 2013-02-13 LAB — POCT UA - MICROSCOPIC ONLY
Casts, Ur, LPF, POC: NEGATIVE
Crystals, Ur, HPF, POC: NEGATIVE
Mucus, UA: NEGATIVE
Yeast, UA: NEGATIVE

## 2013-02-13 LAB — POCT URINALYSIS DIPSTICK
Nitrite, UA: NEGATIVE
Protein, UA: NEGATIVE
Urobilinogen, UA: 0.2
pH, UA: 7

## 2013-02-13 MED ORDER — MECLIZINE HCL 25 MG PO TABS: 12.5000 mg | ORAL_TABLET | Freq: Three times a day (TID) | ORAL | Status: DC | PRN

## 2013-02-13 MED ORDER — LORATADINE 10 MG PO TABS: 10.0000 mg | ORAL_TABLET | Freq: Every day | ORAL | Status: DC

## 2013-02-13 NOTE — Progress Notes (Signed)
[address]   [place], [place]  [address]   [phone]  Subjective:    Patient ID: Alicia Vasquez, female    DOB: May 13, 1941, 72 y.o.   MRN: [id number]  HPI This 72 y.o. female presents for evaluation of dizziness, near-syncope.  Turning head to L or bending over causes dizziness, ear pain L.  Onset today.  Upon awakening at 7:30am with dizziness. Felt like going to fall over.  As long as doesn't look to L, fine.  +blurred vision yestereday; no diplopia.  Duration of each dizzy episode short; will resolve with turning head straight.  Will quickly resolve if turns straight.  No new n/t. +ringing in ears.  Decreased hearing of L ear.  No syncopal event.  +HA onset this morning; mild.  +sinuses blocked/congested; +watery eyes.  +L ear pain today . Very senstiive to smells chronically; plumbing problems with smells at house.  +slight ST; +nasal congestion  +Dry nares.  No rhinorrhea. +Dry cough.  No medications for sinus symptoms.  History of Flonase use in the past; not using now; has some at home.  No focal weakness.    2.  B leg pain: maintained on Coumadin.  Has DVT in L leg; chronic leg pain L>R.  Onset of R leg pain with worsening yesterday; +also recent R knee pain which has improved; R knee was swollen but has now improved; no trauma to R knee.  +Severe pain in R leg onset yesterday; L calf also hurting since diagnosis of L DVT.  Mid back also really painful today and worried about UTI.  Chronic R leg pain.  +Knot for ever along R leg; s/p LE doppler ? Clots.  Last u/s R leg 3-4 weeks ago.  Pain is sharper.  L leg is duller pain.  INR 2 weeks ago and 2.9; due for repeat INR at [facility] this week.  To check it again on Friday.  Wants checked today.  +chronic intermittent chest pain; sharp and shooting but mild; occurs at rest and with exertion. S/p CT chest in past three months  That was negative for PE.  Has been on Coumadin for several weeks; having intermittent upper abdomen pain;  friend/caregiver worried about anemia and gastritis.    3. Middle back pain:  Onset of mid back pain for past day; chronic intermittent pain.  Now worried about UTI because of urinary frequency.  No radiation into legs.  No n/t/w.  No saddle paresthesias; no b/b dysfunction.  Also having neck pain.    4. Urinary frequency:  Worried about UTI due to recent worsening urinary frequency.  Recently diagnosed with UTI by [facility]; treated with Cipro.  Also recently treated with Clindamycin for oral abnormality.  No dysuria.  No fever/chills/sweats.      Review of Systems  Constitutional: Negative for fever, chills, diaphoresis and fatigue.  HENT: Positive for hearing loss, ear pain, congestion, postnasal drip and tinnitus. Negative for sore throat, rhinorrhea and trouble swallowing.   Eyes: Positive for visual disturbance. Negative for photophobia.  Respiratory: Positive for cough. Negative for shortness of breath, wheezing and stridor.   Cardiovascular: Positive for chest pain and leg swelling. Negative for palpitations.  Gastrointestinal: Positive for abdominal pain. Negative for nausea, vomiting, diarrhea and constipation.  Genitourinary: Positive for urgency and frequency. Negative for dysuria and hematuria.  Musculoskeletal: Positive for myalgias, back pain, joint swelling, arthralgias and gait problem.  Skin: Negative for rash and wound.  Neurological: Positive for dizziness and  headaches. Negative for tremors, seizures, syncope, facial asymmetry, speech difficulty, weakness, light-headedness and numbness.   Past Medical History  Diagnosis Date  . Allergy   . Hypertension   . Thyroid disease   . Blood transfusion without reported diagnosis    Past Surgical History  Procedure Laterality Date  . Removal of teeth  04/19/2012    13 teeth removed  . Biospy      female organs   Allergies  Allergen Reactions  . Anesthetics, Amide Other (See Comments)    ELEVATE BLOOD PRESSURE  .  Benadryl [Diphenhydramine Hcl]   . Carbocaine [Mepivacaine Hcl]   . Codeine   . Epinephrine   . Penicillins   . Sulfa Antibiotics   . Vicodin [Hydrocodone-Acetaminophen]    Current Outpatient Prescriptions on File Prior to Visit  Medication Sig Dispense Refill  . lisinopril (PRINIVIL,ZESTRIL) 10 MG tablet Take 1 tablet (10 mg total) by mouth daily.  90 tablet  3  . SYNTHROID 100 MCG tablet Take 1 tablet (100 mcg total) by mouth daily.  90 tablet  3  . warfarin (COUMADIN) 10 MG tablet 1 tablet daily.  No coumadin on Sunday.  30 tablet  3  . warfarin (COUMADIN) 5 MG tablet Take as directed by anticoagulation clinic  30 tablet  2  . albuterol (PROVENTIL HFA;VENTOLIN HFA) 108 (90 BASE) MCG/ACT inhaler Inhale 2 puffs into the lungs every 6 (six) hours as needed.  1 Inhaler  0  . clindamycin (CLEOCIN) 150 MG capsule Take 1 capsule (150 mg total) by mouth 3 (three) times daily.  15 capsule  0  . fluticasone (FLONASE) 50 MCG/ACT nasal spray Place 2 sprays into the nose daily.  1 g  6   Current Facility-Administered Medications on File Prior to Visit  Medication Dose Route Frequency Provider Last Rate Last Dose  . ipratropium (ATROVENT) nebulizer solution 0.5 mg  0.5 mg Nebulization Once Dois Davenport, MD       History   Social History  . Marital Status: Single    Spouse Name: N/A    Number of Children: N/A  . Years of Education: N/A   Occupational History  . Not on file.   Social History Main Topics  . Smoking status: Never Smoker   . Smokeless tobacco: Not on file  . Alcohol Use: No  . Drug Use: No  . Sexual Activity: No   Other Topics Concern  . Not on file   Social History Narrative   Exercise yard work       Objective:   Physical Exam  Nursing note and vitals reviewed. Constitutional: She is oriented to person, place, and time. She appears well-developed and well-nourished. No distress.  HENT:  Head: Normocephalic and atraumatic.  Right Ear: External ear normal.    Left Ear: External ear normal.  Nose: Nose normal.  Mouth/Throat: Oropharynx is clear and moist. No oropharyngeal exudate.  Eyes: Conjunctivae and EOM are normal. Pupils are equal, round, and reactive to light.  Neck: Normal range of motion. Neck supple. No thyromegaly present.  Cardiovascular: Normal rate, regular rhythm, normal heart sounds and intact distal pulses.  Exam reveals no gallop and no friction rub.   No murmur heard. Pulmonary/Chest: Effort normal and breath sounds normal. She has no wheezes. She has no rales.  Abdominal: Soft. Bowel sounds are normal. She exhibits no distension and no mass. There is no tenderness. There is no rebound and no guarding.  Musculoskeletal:  Right knee: Normal.       Left knee: Normal.       Cervical back: She exhibits decreased range of motion, tenderness, pain and spasm. She exhibits no bony tenderness.       Thoracic back: She exhibits decreased range of motion, tenderness, bony tenderness, pain and spasm.       Right lower leg: She exhibits tenderness. She exhibits no bony tenderness, no swelling, no edema, no deformity and no laceration.       Left lower leg: She exhibits tenderness. She exhibits no bony tenderness, no swelling, no edema, no deformity and no laceration.  B posterior calves with TTP.   Cervical neck:  +TTP R trapezius with muscle spasm palpable.  Pain with ROM of neck in all directions. Thoracic spine:  +midline TTP.   Lymphadenopathy:    She has no cervical adenopathy.  Neurological: She is alert and oriented to person, place, and time. No cranial nerve deficit. She exhibits normal muscle tone. Coordination normal.  Dix-Hallpike reproduced dizziness L>R; no nystagmus.   Skin: Skin is warm and dry. No rash noted. She is not diaphoretic.  Psychiatric: She has a normal mood and affect. Her behavior is normal. Judgment and thought content normal.       Results for orders placed in visit on 02/13/13  POCT UA -  MICROSCOPIC ONLY      Result Value Range   WBC, Ur, HPF, POC 2-3     RBC, urine, microscopic 1-2     Bacteria, U Microscopic small     Mucus, UA neg     Epithelial cells, urine per micros 10-15     Crystals, Ur, HPF, POC neg     Casts, Ur, LPF, POC neg     Yeast, UA neg    POCT URINALYSIS DIPSTICK      Result Value Range   Color, UA yellow     Clarity, UA hazy     Glucose, UA neg     Bilirubin, UA neg     Ketones, UA neg     Spec Grav, UA 1.015     Blood, UA trace     pH, UA 7.0     Protein, UA neg     Urobilinogen, UA 0.2     Nitrite, UA neg     Leukocytes, UA Trace    POCT CBC      Result Value Range   WBC 8.1  4.6 - 10.2 K/uL   Lymph, poc 1.7  0.6 - 3.4   POC LYMPH PERCENT 21.4  10 - 50 %L   MID (cbc) 0.9  0 - 0.9   POC MID % 10.6  0 - 12 %M   POC Granulocyte 5.5  2 - 6.9   Granulocyte percent 68.0  37 - 80 %G   RBC 4.38  4.04 - 5.48 M/uL   Hemoglobin 13.8  12.2 - 16.2 g/dL   HCT, POC 16.1  09.6 - 47.9 %   MCV 99.0 (*) 80 - 97 fL   MCH, POC 31.5 (*) 27 - 31.2 pg   MCHC 31.8  31.8 - 35.4 g/dL   RDW, POC 04.5     Platelet Count, POC 235  142 - 424 K/uL   MPV 9.2  0 - 99.8 fL   EKG:  NSR; no acute ST changes. Assessment & Plan:  Allergic rhinitis, cause unspecified - Plan: EKG 12-Lead, POCT UA - Microscopic Only, POCT urinalysis dipstick, Urine culture, Comprehensive  metabolic panel, Protime-INR  Dizziness and giddiness - Plan: EKG 12-Lead, POCT UA - Microscopic Only, POCT urinalysis dipstick, Urine culture, Comprehensive metabolic panel, Protime-INR, POCT CBC  Urinary frequency - Plan: EKG 12-Lead, POCT UA - Microscopic Only, POCT urinalysis dipstick, Urine culture, Comprehensive metabolic panel, Protime-INR  Chest pain, unspecified - Plan: EKG 12-Lead, POCT UA - Microscopic Only, POCT urinalysis dipstick, Urine culture, Comprehensive metabolic panel, Protime-INR, POCT CBC  Benign paroxysmal positional vertigo  DVT (deep venous thrombosis), left  1.   Dizziness:  New.  Consistent with vertigo.  Rx for Meclizine provided. Will also treat allergic rhinitis. 2. Allergic Rhinitis:  Uncontrolled; rx for Claritin 10mg  daily; to restart home Flonase daily. 3.  Urinary frequency:  New.  Associated with back pain. Send urine culture. 4.  Chest pain: Persistent; atypical. S/p Ct chest negative for PE after diagnosis of DVT; Rx for Omeprazole provided for epigastric pain at times.  If chest pain persists, warrants cardiolite and cardiology consultation. 5.  DVT: stable; obtain INR; followed by [place] coumadin clinic. Benign leg exam. 6. Back pain:  New.  Pt declined xrays of cervical and thoracic spine films today; if persists and urine culture negative, to return to clinic for xrays.  Meds ordered this encounter  Medications  . meclizine (ANTIVERT) 25 MG tablet    Sig: Take 0.5 tablets (12.5 mg total) by mouth 3 (three) times daily as needed.    Dispense:  30 tablet    Refill:  0  . loratadine (CLARITIN) 10 MG tablet    Sig: Take 1 tablet (10 mg total) by mouth daily.    Dispense:  30 tablet    Refill:  5

## 2013-02-13 NOTE — Patient Instructions (Addendum)
1.  Start Prilosec OTC one tablet daily. 2. Restart Flonase nasal spray two sprays each nostril daily.

## 2013-02-14 ENCOUNTER — Encounter: Payer: Self-pay | Admitting: *Deleted

## 2013-02-14 LAB — PROTIME-INR
INR: 2.1 — ABNORMAL HIGH (ref <1.50)
Prothrombin Time: 22.9 seconds — ABNORMAL HIGH (ref 11.6–15.2)

## 2013-02-14 LAB — COMPREHENSIVE METABOLIC PANEL
BUN: 12 mg/dL (ref 6–23)
CO2: 30 mEq/L (ref 19–32)
Creat: 0.8 mg/dL (ref 0.50–1.10)
Glucose, Bld: 93 mg/dL (ref 70–99)
Sodium: 139 mEq/L (ref 135–145)
Total Bilirubin: 0.4 mg/dL (ref 0.3–1.2)
Total Protein: 7.3 g/dL (ref 6.0–8.3)

## 2013-02-15 ENCOUNTER — Telehealth: Payer: Self-pay | Admitting: *Deleted

## 2013-02-15 ENCOUNTER — Ambulatory Visit (INDEPENDENT_AMBULATORY_CARE_PROVIDER_SITE_OTHER): Payer: Medicare Other | Admitting: General Practice

## 2013-02-15 ENCOUNTER — Other Ambulatory Visit: Payer: Self-pay

## 2013-02-15 ENCOUNTER — Encounter: Payer: Self-pay | Admitting: *Deleted

## 2013-02-15 DIAGNOSIS — J302 Other seasonal allergic rhinitis: Secondary | ICD-10-CM

## 2013-02-15 DIAGNOSIS — I82409 Acute embolism and thrombosis of unspecified deep veins of unspecified lower extremity: Secondary | ICD-10-CM | POA: Diagnosis not present

## 2013-02-15 DIAGNOSIS — I82402 Acute embolism and thrombosis of unspecified deep veins of left lower extremity: Secondary | ICD-10-CM

## 2013-02-15 MED ORDER — FLUTICASONE PROPIONATE 50 MCG/ACT NA SUSP: 2.0000 | Freq: Every day | NASAL | Status: DC

## 2013-02-15 NOTE — Telephone Encounter (Signed)
Can you please call [facility] to check on Urine Culture?

## 2013-02-16 NOTE — Telephone Encounter (Signed)
ucx back

## 2013-02-19 ENCOUNTER — Institutional Professional Consult (permissible substitution): Payer: Medicare Other | Admitting: Cardiology

## 2013-02-19 MED ORDER — CIPROFLOXACIN HCL 250 MG PO TABS: 250.0000 mg | ORAL_TABLET | Freq: Two times a day (BID) | ORAL | Status: DC

## 2013-02-19 NOTE — Addendum Note (Signed)
Addended by: Ethelda Chick on: 02/19/2013 04:46 PM   Modules accepted: Orders

## 2013-02-22 ENCOUNTER — Telehealth: Payer: Self-pay

## 2013-02-22 MED ORDER — LEVOFLOXACIN 500 MG PO TABS: 500.0000 mg | ORAL_TABLET | Freq: Every day | ORAL | Status: DC

## 2013-02-22 NOTE — Telephone Encounter (Signed)
Pt is calling to state that her anitibotic was suppose to be called into [facility] And she wants to know if that antitbotic is also good for her sinus infection Call back number is [phone]

## 2013-02-22 NOTE — Telephone Encounter (Signed)
cipro for UTI, not for the sinuses , she had treatment for allergies, not sinus infection. Called patient. She states she did not get the antibiotic yet, because she wants one that will cover sinus and UTI, she states she feels like her ears are stopped up, and feels like she has developed sinus infection.

## 2013-02-22 NOTE — Telephone Encounter (Signed)
I sent in Levofloxacin to pharmacy which will treat UTI and sinus infection.  Please call pharmacy and delete rx for Cipro.  Antibiotic will affect Coumadin level.  Recommend contacting [place] coumad[facility] clinic to see how to take Coumadin and when pt will need repeat Coumadin level.

## 2013-02-24 NOTE — Telephone Encounter (Signed)
Called patient no answer.

## 2013-02-25 ENCOUNTER — Telehealth: Payer: Self-pay | Admitting: General Practice

## 2013-02-25 NOTE — Telephone Encounter (Signed)
Returned call to patient and left message on voice mail.

## 2013-02-26 NOTE — Telephone Encounter (Signed)
Patient advised. She has let the coumadin clinic know

## 2013-02-26 NOTE — Telephone Encounter (Signed)
Called patient. Left message for her to call me back.

## 2013-03-01 ENCOUNTER — Ambulatory Visit (INDEPENDENT_AMBULATORY_CARE_PROVIDER_SITE_OTHER): Payer: Medicare Other | Admitting: General Practice

## 2013-03-01 DIAGNOSIS — I82402 Acute embolism and thrombosis of unspecified deep veins of left lower extremity: Secondary | ICD-10-CM

## 2013-03-01 DIAGNOSIS — Z7901 Long term (current) use of anticoagulants: Secondary | ICD-10-CM | POA: Diagnosis not present

## 2013-03-01 DIAGNOSIS — I82409 Acute embolism and thrombosis of unspecified deep veins of unspecified lower extremity: Secondary | ICD-10-CM

## 2013-03-26 ENCOUNTER — Encounter: Payer: Self-pay | Admitting: Cardiology

## 2013-03-26 ENCOUNTER — Ambulatory Visit (INDEPENDENT_AMBULATORY_CARE_PROVIDER_SITE_OTHER): Payer: Medicare Other | Admitting: Cardiology

## 2013-03-26 VITALS — BP 150/60 | HR 80 | Ht 64.0 in | Wt 236.8 lb

## 2013-03-26 DIAGNOSIS — I82409 Acute embolism and thrombosis of unspecified deep veins of unspecified lower extremity: Secondary | ICD-10-CM

## 2013-03-26 DIAGNOSIS — R0602 Shortness of breath: Secondary | ICD-10-CM

## 2013-03-26 DIAGNOSIS — R079 Chest pain, unspecified: Secondary | ICD-10-CM | POA: Diagnosis not present

## 2013-03-26 DIAGNOSIS — I10 Essential (primary) hypertension: Secondary | ICD-10-CM | POA: Diagnosis not present

## 2013-03-26 DIAGNOSIS — I82402 Acute embolism and thrombosis of unspecified deep veins of left lower extremity: Secondary | ICD-10-CM

## 2013-03-26 NOTE — Assessment & Plan Note (Signed)
Blood pressure mildly elevated. She will continue present medications and followup with primary care. Lisinopril can be increased as needed.

## 2013-03-26 NOTE — Patient Instructions (Signed)
Your physician recommends that you schedule a follow-up appointment in:  AS NEEDED PENDING TEST RESULTS  Your physician has requested that you have a lexiscan myoview. For further information please visit [email or web address]. Please follow instruction sheet, as given.   

## 2013-03-26 NOTE — Assessment & Plan Note (Signed)
Continue Coumadin.Management per primary care. 

## 2013-03-26 NOTE — Assessment & Plan Note (Signed)
Lexiscan myoview to quantify LV function and exclude ischemia. I will obtain previous echocardiogram report from Dr. Verl Dicker office.

## 2013-03-26 NOTE — Assessment & Plan Note (Signed)
Symptoms are atypical. Plan lexiscan myoview for risk stratification.

## 2013-03-26 NOTE — Progress Notes (Signed)
HPI: 72 year old female for evaluation of chest pain. Venous Dopplers in June of 2014 showed deep vein thrombosis involving the left posterior tibial vein. CTA of the chest in July of 2014 showed no dissection or pulmonary embolus. TSH May 2014 2.487. Hemoglobin September 2014 13.8; renal function and liver functions normal. Patient states she initially developed chest pain after taking xeralto. However she continues to have less severe episodes. It is diffuse across her upper chest. It is not exertional. No associated symptoms. Last several minutes and resolve spontaneously. She also has dyspnea on exertion. No orthopnea, PND but occasional mild pedal edema. No syncope. Because of the above we were asked to evaluate.  Current Outpatient Prescriptions  Medication Sig Dispense Refill  . albuterol (PROVENTIL HFA;VENTOLIN HFA) 108 (90 BASE) MCG/ACT inhaler Inhale 2 puffs into the lungs every 6 (six) hours as needed.  1 Inhaler  0  . lisinopril (PRINIVIL,ZESTRIL) 10 MG tablet Take 1 tablet (10 mg total) by mouth daily.  90 tablet  3  . SYNTHROID 100 MCG tablet Take 1 tablet (100 mcg total) by mouth daily.  90 tablet  3  . warfarin (COUMADIN) 5 MG tablet Take as directed by anticoagulation clinic  30 tablet  2   Current Facility-Administered Medications  Medication Dose Route Frequency Provider Last Rate Last Dose  . ipratropium (ATROVENT) nebulizer solution 0.5 mg  0.5 mg Nebulization Once Dois Davenport, MD        Allergies  Allergen Reactions  . Anesthetics, Amide Other (See Comments)    ELEVATE BLOOD PRESSURE  . Benadryl [Diphenhydramine Hcl]   . Carbocaine [Mepivacaine Hcl]   . Codeine   . Epinephrine   . Penicillins   . Sulfa Antibiotics   . Vicodin [Hydrocodone-Acetaminophen]     Past Medical History  Diagnosis Date  . Allergy   . Hypertension   . Hypothyroid   . Blood transfusion without reported diagnosis   . Pneumonia   . DVT (deep vein thrombosis) in pregnancy      Past Surgical History  Procedure Laterality Date  . Removal of teeth  04/19/2012    13 teeth removed  . Biospy      female organs    History   Social History  . Marital Status: Widowed    Spouse Name: N/A    Number of Children: 1  . Years of Education: N/A   Occupational History  . Not on file.   Social History Main Topics  . Smoking status: Never Smoker   . Smokeless tobacco: Not on file  . Alcohol Use: No  . Drug Use: No  . Sexual Activity: No   Other Topics Concern  . Not on file   Social History Narrative   Exercise yard work    Family History  Problem Relation Age of Onset  . Cancer Brother   . Heart disease Brother     ROS: no fevers or chills, productive cough, hemoptysis, dysphasia, odynophagia, melena, hematochezia, dysuria, hematuria, rash, seizure activity, orthopnea, PND, claudication. Remaining systems are negative.  Physical Exam:   Blood pressure 150/60, pulse 80, height 5\' 4"  (1.626 m), weight 236 lb 12.8 oz (107.412 kg).  General:  Well developed/obese in NAD Skin warm/dry Patient not depressed No peripheral clubbing Back-normal HEENT-normal/normal eyelids Neck supple/normal carotid upstroke bilaterally; no bruits; no JVD; no thyromegaly chest - CTA/ normal expansion CV - RRR/normal S1 and S2; no murmurs, rubs or gallops;  PMI nondisplaced Abdomen -NT/ND, no HSM, no  mass, + bowel sounds, no bruit 2+ femoral pulses, no bruits Ext-trace edema, no chords, 2+ DP Neuro-grossly nonfocal  ECG 02/13/2013-sinus rhythm with RV conduction delay.

## 2013-03-29 ENCOUNTER — Ambulatory Visit (INDEPENDENT_AMBULATORY_CARE_PROVIDER_SITE_OTHER): Payer: Medicare Other | Admitting: General Practice

## 2013-03-29 DIAGNOSIS — I82402 Acute embolism and thrombosis of unspecified deep veins of left lower extremity: Secondary | ICD-10-CM

## 2013-03-29 DIAGNOSIS — I82409 Acute embolism and thrombosis of unspecified deep veins of unspecified lower extremity: Secondary | ICD-10-CM | POA: Diagnosis not present

## 2013-04-08 ENCOUNTER — Other Ambulatory Visit: Payer: Self-pay | Admitting: Internal Medicine

## 2013-04-11 ENCOUNTER — Telehealth: Payer: Self-pay | Admitting: Cardiology

## 2013-04-11 NOTE — Telephone Encounter (Signed)
Error

## 2013-04-15 ENCOUNTER — Encounter (HOSPITAL_COMMUNITY): Payer: Medicare Other

## 2013-04-18 ENCOUNTER — Encounter: Payer: Self-pay | Admitting: Internal Medicine

## 2013-04-18 ENCOUNTER — Ambulatory Visit (INDEPENDENT_AMBULATORY_CARE_PROVIDER_SITE_OTHER): Payer: Medicare Other | Admitting: Internal Medicine

## 2013-04-18 VITALS — BP 140/80 | HR 99 | Temp 97.7°F | Wt 235.2 lb

## 2013-04-18 DIAGNOSIS — I10 Essential (primary) hypertension: Secondary | ICD-10-CM | POA: Diagnosis not present

## 2013-04-18 DIAGNOSIS — H6980 Other specified disorders of Eustachian tube, unspecified ear: Secondary | ICD-10-CM

## 2013-04-18 DIAGNOSIS — J209 Acute bronchitis, unspecified: Secondary | ICD-10-CM

## 2013-04-18 DIAGNOSIS — H6982 Other specified disorders of Eustachian tube, left ear: Secondary | ICD-10-CM

## 2013-04-18 MED ORDER — AZITHROMYCIN 250 MG PO TABS: ORAL_TABLET | ORAL | Status: DC

## 2013-04-18 MED ORDER — HYDROCODONE-HOMATROPINE 5-1.5 MG/5ML PO SYRP: 5.0000 mL | ORAL_SOLUTION | Freq: Four times a day (QID) | ORAL | Status: DC | PRN

## 2013-04-18 NOTE — Progress Notes (Signed)
Pre-visit discussion using our clinic review tool. No additional management support is needed unless otherwise documented below in the visit note.  

## 2013-04-18 NOTE — Patient Instructions (Signed)
Please take all new medication as prescribed - the antibiotic and cough medicine Please continue all other medications as before Please have the pharmacy call with any other refills you may need.

## 2013-04-18 NOTE — Progress Notes (Signed)
Subjective:    Patient ID: Alicia Vasquez, female    DOB: 11-Jun-1940, 72 y.o.   MRN: [id number]  HPI  Here with acute onset mild to mod 2-3 days ST, HA, general weakness and malaise, with prod cough greenish sputum, but Pt denies chest pain, increased sob or doe, wheezing, orthopnea, PND, increased LE swelling, palpitations, dizziness or syncope.  ALso some significant left ear popping, crackling, fullness.  Pt denies new neurological symptoms such as new headache, or facial or extremity weakness or numbness  Pt denies polydipsia Past Medical History  Diagnosis Date  . Allergy   . Hypertension   . Hypothyroid   . Blood transfusion without reported diagnosis   . Pneumonia   . DVT (deep vein thrombosis) in pregnancy    Past Surgical History  Procedure Laterality Date  . Removal of teeth  04/19/2012    13 teeth removed  . Biospy      female organs    reports that she has never smoked. She does not have any smokeless tobacco history on file. She reports that she does not drink alcohol or use illicit drugs. family history includes Cancer in her brother; Heart disease in her brother. Allergies  Allergen Reactions  . Anesthetics, Amide Other (See Comments)    ELEVATE BLOOD PRESSURE  . Benadryl [Diphenhydramine Hcl]   . Carbocaine [Mepivacaine Hcl]   . Codeine   . Epinephrine   . Penicillins   . Sulfa Antibiotics   . Vicodin [Hydrocodone-Acetaminophen]    Current Outpatient Prescriptions on File Prior to Visit  Medication Sig Dispense Refill  . albuterol (PROVENTIL HFA;VENTOLIN HFA) 108 (90 BASE) MCG/ACT inhaler Inhale 2 puffs into the lungs every 6 (six) hours as needed.  1 Inhaler  0  . lisinopril (PRINIVIL,ZESTRIL) 10 MG tablet Take 1 tablet (10 mg total) by mouth daily.  90 tablet  3  . SYNTHROID 100 MCG tablet Take 1 tablet (100 mcg total) by mouth daily.  90 tablet  3  . warfarin (COUMADIN) 5 MG tablet TAKE AS DIRECTED BY ANTICOAGULATION CLINIC  30 tablet  2   Current  Facility-Administered Medications on File Prior to Visit  Medication Dose Route Frequency Provider Last Rate Last Dose  . ipratropium (ATROVENT) nebulizer solution 0.5 mg  0.5 mg Nebulization Once Dois Davenport, MD          Review of Systems  Constitutional: Negative for unexpected weight change, or unusual diaphoresis  HENT: Negative for tinnitus.   Eyes: Negative for photophobia and visual disturbance.  Respiratory: Negative for choking and stridor.   Gastrointestinal: Negative for vomiting and blood in stool.  Genitourinary: Negative for hematuria and decreased urine volume.  Musculoskeletal: Negative for acute joint swelling Skin: Negative for color change and wound.  Neurological: Negative for tremors and numbness other than noted  Psychiatric/Behavioral: Negative for decreased concentration or  hyperactivity.       Objective:   Physical Exam BP 140/80  Pulse 99  Temp(Src) 97.7 F (36.5 C) (Oral)  Wt 235 lb 4 oz (106.709 kg)  SpO2 95% VS noted, mild ill Constitutional: Pt appears well-developed and well-nourished.  HENT: Head: NCAT.  Right Ear: External ear normal.  Left Ear: External ear normal.  Bilat tm's with mild erythema.  Max sinus areas non tender.  Pharynx with mild erythema, no exudate Eyes: Conjunctivae and EOM are normal. Pupils are equal, round, and reactive to light.  Neck: Normal range of motion. Neck supple.  Cardiovascular: Normal rate and regular  rhythm.   Pulmonary/Chest: Effort normal and breath sounds normal.  - no rales or wheezing Neurological: Pt is alert. Not confused  Skin: Skin is warm. No erythema.  Psychiatric: Pt behavior is normal. Thought content normal.     Assessment & Plan:

## 2013-04-21 NOTE — Assessment & Plan Note (Signed)
stable overall by history and exam, recent data reviewed with pt, and pt to continue medical treatment as before,  to f/u any worsening symptoms or concerns BP Readings from Last 3 Encounters:  04/18/13 140/80  03/26/13 150/60  02/13/13 150/80

## 2013-04-21 NOTE — Assessment & Plan Note (Signed)
Also for mucinex otc prn,  to f/u any worsening symptoms or concerns  

## 2013-04-21 NOTE — Assessment & Plan Note (Signed)
Mild to mod, for antibx course,  to f/u any worsening symptoms or concerns 

## 2013-04-23 ENCOUNTER — Ambulatory Visit (INDEPENDENT_AMBULATORY_CARE_PROVIDER_SITE_OTHER): Payer: Medicare Other | Admitting: General Practice

## 2013-04-23 DIAGNOSIS — I82402 Acute embolism and thrombosis of unspecified deep veins of left lower extremity: Secondary | ICD-10-CM

## 2013-04-23 DIAGNOSIS — I82409 Acute embolism and thrombosis of unspecified deep veins of unspecified lower extremity: Secondary | ICD-10-CM | POA: Diagnosis not present

## 2013-04-23 DIAGNOSIS — Z7901 Long term (current) use of anticoagulants: Secondary | ICD-10-CM

## 2013-04-23 LAB — POCT INR: INR: 2.6

## 2013-04-23 NOTE — Progress Notes (Signed)
Pre-visit discussion using our clinic review tool. No additional management support is needed unless otherwise documented below in the visit note.  

## 2013-05-07 ENCOUNTER — Telehealth: Payer: Self-pay

## 2013-05-07 NOTE — Telephone Encounter (Signed)
Pt states that she had shooting pains in her chest last night for about 4-5 minutes, pt would like to know if she should be concerned, is this something common with taking coumadin? Best#[phone]

## 2013-05-08 NOTE — Telephone Encounter (Signed)
Spoke with patient and she had chest pains on Monday night.  They lasted 4 to 5 minutes.  She states the pain was like it was in June  She also feels like she still had a UTI.  She does not want to come in unless necessary.  I advised her to come on in.

## 2013-05-17 ENCOUNTER — Ambulatory Visit (INDEPENDENT_AMBULATORY_CARE_PROVIDER_SITE_OTHER): Payer: Medicare Other | Admitting: General Practice

## 2013-05-17 DIAGNOSIS — I82402 Acute embolism and thrombosis of unspecified deep veins of left lower extremity: Secondary | ICD-10-CM

## 2013-05-17 DIAGNOSIS — I82409 Acute embolism and thrombosis of unspecified deep veins of unspecified lower extremity: Secondary | ICD-10-CM

## 2013-05-17 LAB — POCT INR: INR: 2.9

## 2013-05-17 NOTE — Progress Notes (Signed)
Pre-visit discussion using our clinic review tool. No additional management support is needed unless otherwise documented below in the visit note.  

## 2013-05-30 HISTORY — PX: BREAST LUMPECTOMY: SHX2

## 2013-06-14 ENCOUNTER — Ambulatory Visit (INDEPENDENT_AMBULATORY_CARE_PROVIDER_SITE_OTHER): Payer: Medicare Other | Admitting: General Practice

## 2013-06-14 DIAGNOSIS — I82409 Acute embolism and thrombosis of unspecified deep veins of unspecified lower extremity: Secondary | ICD-10-CM

## 2013-06-14 DIAGNOSIS — I82402 Acute embolism and thrombosis of unspecified deep veins of left lower extremity: Secondary | ICD-10-CM

## 2013-06-14 LAB — POCT INR: INR: 3.2

## 2013-06-14 NOTE — Progress Notes (Signed)
Pre-visit discussion using our clinic review tool. No additional management support is needed unless otherwise documented below in the visit note.  

## 2013-06-17 ENCOUNTER — Other Ambulatory Visit: Payer: Self-pay | Admitting: General Practice

## 2013-06-17 ENCOUNTER — Other Ambulatory Visit: Payer: Self-pay | Admitting: Family Medicine

## 2013-06-17 MED ORDER — WARFARIN SODIUM 5 MG PO TABS: ORAL_TABLET | ORAL | Status: DC

## 2013-06-18 ENCOUNTER — Other Ambulatory Visit: Payer: Self-pay | Admitting: Family Medicine

## 2013-06-18 ENCOUNTER — Telehealth: Payer: Self-pay

## 2013-06-18 NOTE — Telephone Encounter (Signed)
Spoke with pt, we denied the coumadin 5mg . Please advise.

## 2013-06-18 NOTE — Telephone Encounter (Signed)
PT STATES SHE WAS DENIED HER MEDICINE AND WANTED [name] TO LET DR Synetta Shadow KNOW BECAUSE HE WILL UNDERSTAND. PLEASE CALL [phone], IT'S ONE SHE HAVE TO PICK UP

## 2013-06-19 NOTE — Telephone Encounter (Signed)
I think this should be managed by the coumadin clinic where patient is being monitored.

## 2013-06-19 NOTE — Telephone Encounter (Signed)
Advised pt to contact coumadin clinic for refills.  Pt is scheduling an appt for her other follow up next month with Dr. Joseph Art.

## 2013-06-27 ENCOUNTER — Ambulatory Visit (HOSPITAL_COMMUNITY)
Admission: RE | Admit: 2013-06-27 | Discharge: 2013-06-27 | Disposition: A | Discharge status: Home / Self Care | Payer: Medicare Other | Source: Ambulatory Visit | Attending: Family Medicine | Admitting: Family Medicine

## 2013-06-27 ENCOUNTER — Ambulatory Visit (INDEPENDENT_AMBULATORY_CARE_PROVIDER_SITE_OTHER): Payer: Medicare Other | Admitting: Family Medicine

## 2013-06-27 ENCOUNTER — Telehealth: Payer: Self-pay | Admitting: Radiology

## 2013-06-27 ENCOUNTER — Encounter: Payer: Self-pay | Admitting: Family Medicine

## 2013-06-27 ENCOUNTER — Ambulatory Visit
Admission: RE | Admit: 2013-06-27 | Discharge: 2013-06-27 | Disposition: A | Discharge status: Home / Self Care | Payer: Medicare Other | Source: Ambulatory Visit | Attending: Family Medicine | Admitting: Family Medicine

## 2013-06-27 VITALS — BP 130/80 | HR 82 | Temp 97.9°F | Resp 16 | Ht 63.5 in | Wt 235.4 lb

## 2013-06-27 DIAGNOSIS — R0602 Shortness of breath: Secondary | ICD-10-CM | POA: Diagnosis not present

## 2013-06-27 DIAGNOSIS — E039 Hypothyroidism, unspecified: Secondary | ICD-10-CM | POA: Diagnosis not present

## 2013-06-27 DIAGNOSIS — M79609 Pain in unspecified limb: Secondary | ICD-10-CM | POA: Insufficient documentation

## 2013-06-27 DIAGNOSIS — G8929 Other chronic pain: Secondary | ICD-10-CM | POA: Insufficient documentation

## 2013-06-27 DIAGNOSIS — Z86718 Personal history of other venous thrombosis and embolism: Secondary | ICD-10-CM | POA: Diagnosis not present

## 2013-06-27 DIAGNOSIS — M79605 Pain in left leg: Secondary | ICD-10-CM

## 2013-06-27 DIAGNOSIS — M7989 Other specified soft tissue disorders: Secondary | ICD-10-CM

## 2013-06-27 DIAGNOSIS — I809 Phlebitis and thrombophlebitis of unspecified site: Secondary | ICD-10-CM

## 2013-06-27 DIAGNOSIS — J329 Chronic sinusitis, unspecified: Secondary | ICD-10-CM | POA: Diagnosis not present

## 2013-06-27 DIAGNOSIS — R9389 Abnormal findings on diagnostic imaging of other specified body structures: Secondary | ICD-10-CM

## 2013-06-27 DIAGNOSIS — R079 Chest pain, unspecified: Secondary | ICD-10-CM | POA: Diagnosis not present

## 2013-06-27 DIAGNOSIS — N390 Urinary tract infection, site not specified: Secondary | ICD-10-CM

## 2013-06-27 LAB — POCT URINALYSIS DIPSTICK
Bilirubin, UA: NEGATIVE
Blood, UA: NEGATIVE
Glucose, UA: NEGATIVE
Ketones, UA: NEGATIVE
Nitrite, UA: NEGATIVE
Protein, UA: NEGATIVE
Spec Grav, UA: 1.01
Urobilinogen, UA: 0.2
pH, UA: 5.5

## 2013-06-27 MED ORDER — IOHEXOL 350 MG/ML SOLN
125.0000 mL | Freq: Once | INTRAVENOUS | Status: AC | PRN
  Administered 2013-06-27: 125 mL via INTRAVENOUS

## 2013-06-27 MED ORDER — LEVOFLOXACIN 500 MG PO TABS: 500.0000 mg | ORAL_TABLET | Freq: Every day | ORAL | Status: DC

## 2013-06-27 NOTE — Progress Notes (Signed)
*  PRELIMINARY RESULTS* Vascular Ultrasound Left lower extremity venous duplex has been completed.  Preliminary findings: No evidence of DVT.  Called report to Amy at Dr. Pauletta Browns office.   Landry Mellow, RDMS, RVT  06/27/2013, 2:15 PM

## 2013-06-27 NOTE — Patient Instructions (Addendum)
You go now /today for the CT scan at [facility]   Driving directions to [place], [place], [place] [id number]  - more info    [place], [place] [address]     1. Head south on [address] toward [address]      0.5 mi    2. Sharp left onto [address]      0.6 mi    3. Turn left onto the [address] ramp      0.2 mi    4. Merge onto [address]      3.0 mi    5. Continue straight to stay on [address]      0.4 mi    6. Slight left to stay on [facility]  Destination will be on the right     1.0 mi     [place], [place] [address]    After the CT scan go for the Doppler Study ([name]     [name] [address], [place],  [place]  [phone]

## 2013-06-27 NOTE — Telephone Encounter (Addendum)
Patients doppler study was negative for DVT. CT scan was not done yet. Left message for patient to call me back so I can advise. [phone] is her friends number to call her back after she has the CT scan. She did not want to go for the scan because she has not eaten yet. Encouraged her to go for scan today, now since there is not a clot in her leg, may have moved to her lung her leg is very painful, her chest is painful also. She states she is going now for the scan, and I will call her on her friends number once she has the scan.

## 2013-06-27 NOTE — Progress Notes (Signed)
Subjective:  This chart was scribed for [name], MD by Donato Schultz, Medical Scribe. This patient was seen in Room 24 and the patient's care was started at 11:09 AM.   Patient ID: Alicia Vasquez, female    DOB: 25-Mar-1941, 73 y.o.   MRN: [id number]  HPI HPI Comments: Alicia Vasquez is a 73 y.o. female who presents to the [facility] needing a medication refill.  The patient states that she has had otitis media since her last visit but does not remember when she was diagnosed.  She states that she saw Dr. Reginia Forts for her otitis media.  She states that she is still having trouble hearing.   The patient states that she also suffered from a UTI and a blood clot in her left leg.  She states that she is still walking with a cane.    The patient states that she was scanned for emboli in her lungs which revealed normal results.  The patient is complaining of soreness and edema in her left knee.  She states that her symptoms are being monitored by the Coumadin Clinic.  She states that she takes 2 doses of Coumadin Monday, Wednesday and Friday.  The patient is also complaining of watering from her left eye.    She is also complaining of intermittent chest pain that started yesterday.  She states that the episodes last a couple of seconds and then go away.  She lists SOB as an associated symptom.     Past Medical History  Diagnosis Date   Allergy    Hypertension    Hypothyroid    Blood transfusion without reported diagnosis    Pneumonia    DVT (deep vein thrombosis) in pregnancy    Past Surgical History  Procedure Laterality Date   Removal of teeth  04/19/2012    13 teeth removed   Biospy      female organs   Family History  Problem Relation Age of Onset   Cancer Brother    Heart disease Brother    History   Social History   Marital Status: Widowed    Spouse Name: N/A    Number of Children: 1   Years of Education: N/A   Occupational  History   Not on file.   Social History Main Topics   Smoking status: Never Smoker    Smokeless tobacco: Not on file   Alcohol Use: No   Drug Use: No   Sexual Activity: No   Other Topics Concern   Not on file   Social History Narrative   Exercise yard work   Allergies  Allergen Reactions   Anesthetics, Amide Other (See Comments)    ELEVATE BLOOD PRESSURE   Benadryl [Diphenhydramine Hcl]    Carbocaine [Mepivacaine Hcl]    Codeine    Epinephrine    Penicillins    Sulfa Antibiotics    Vicodin [Hydrocodone-Acetaminophen]      Review of Systems  HENT: Positive for congestion and hearing loss.   Respiratory: Positive for shortness of breath.   Cardiovascular: Positive for chest pain and leg swelling (left leg).  All other systems reviewed and are negative.     Objective:  Physical Exam  Nursing note and vitals reviewed. Constitutional: She is oriented to person, place, and time. She appears well-developed and well-nourished.  HENT:  Head: Normocephalic and atraumatic.  Bilateral TM wrinkling/distortion  Eyes: EOM are normal.  Neck: Normal range of motion.  Left  small thyroid nodule, movable, nontender, firm  Cardiovascular: Normal rate, regular rhythm, normal heart sounds and intact distal pulses.  Exam reveals no gallop and no friction rub.   No murmur heard. Pulmonary/Chest: Effort normal and breath sounds normal. No respiratory distress. She has no wheezes. She has no rales.  Musculoskeletal: Normal range of motion.  nontender calf without cords, mild left lateral knee tenderness where patient indicates she was bumped recently.  Neurological: She is alert and oriented to person, place, and time.  Skin: Skin is warm and dry.  Psychiatric: She has a normal mood and affect. Her behavior is normal.    Pulse 82   Temp(Src) 97.9 F (36.6 C) (Oral)   Resp 16   Ht 5' 3.5" (1.613 m)   Wt 235 lb 6.4 oz (106.777 kg)   BMI 41.04 kg/m2   SpO2 94% Assessment  & Plan:    I personally performed the services described in this documentation, which was scribed in my presence. The recorded information has been reviewed and is accurate.  Hypothyroid - Plan: TSH  Thrombophlebitis - Plan: Lower Extremity Venous Duplex Left, CT Angio Chest PE W/Cm &/Or Wo Cm, BUN, Creatinine with Est GFR  Chest pain - Plan: CT Angio Chest PE W/Cm &/Or Wo Cm, BUN, Creatinine with Est GFR  Sinusitis - Plan: levofloxacin (LEVAQUIN) 500 MG tablet  UTI (lower urinary tract infection) - Plan: POCT urinalysis dipstick  Left leg pain - Plan: Lower Extremity Venous Duplex Left, BUN, Creatinine with Est GFR  Signed, [name], MD

## 2013-06-27 NOTE — Telephone Encounter (Signed)
IMPRESSION:  No evidence of acute pulmonary thromboembolism.  New spiculated mass in the right breast associated with right  axillary adenopathy. Malignancy is not excluded. Mammogram is  recommended.  No blood clot, have pended order for Mammogram and [name], will you sign after we call patient?

## 2013-06-28 NOTE — Telephone Encounter (Signed)
Pt has been to the [facility] on [address]. She would like to return to that location for her mammogram.

## 2013-06-28 NOTE — Telephone Encounter (Signed)
Patient returning call to Amy/Dr. Joseph Art   [phone]

## 2013-06-29 ENCOUNTER — Telehealth: Payer: Self-pay

## 2013-06-29 NOTE — Telephone Encounter (Signed)
Dr. Joseph Art: Please call patient back to discuss CT scan on Friday 06/27/13. She said you called her but her phone messages weren't working. Her best number is [phone]

## 2013-06-29 NOTE — Telephone Encounter (Signed)
Told to get mammagram asap

## 2013-06-29 NOTE — Telephone Encounter (Signed)
Please review for pt and I can call her back.

## 2013-07-01 ENCOUNTER — Other Ambulatory Visit: Payer: Self-pay | Admitting: *Deleted

## 2013-07-01 DIAGNOSIS — N6310 Unspecified lump in the right breast, unspecified quadrant: Secondary | ICD-10-CM

## 2013-07-02 ENCOUNTER — Telehealth: Payer: Self-pay

## 2013-07-02 DIAGNOSIS — N6310 Unspecified lump in the right breast, unspecified quadrant: Secondary | ICD-10-CM

## 2013-07-02 NOTE — Telephone Encounter (Signed)
[facility] called to ask for change in order for R US Breast. It needs to be a US Breast LDT instead of complete AND Dx needs to be R breast mass instead of abn CT. Ordered new [name] as instr'd. Dr Carlean Jews, [facility]

## 2013-07-03 ENCOUNTER — Ambulatory Visit
Admission: RE | Admit: 2013-07-03 | Discharge: 2013-07-03 | Disposition: A | Discharge status: Home / Self Care | Payer: Medicare Other | Source: Ambulatory Visit | Attending: Family Medicine | Admitting: Family Medicine

## 2013-07-03 ENCOUNTER — Other Ambulatory Visit: Payer: Self-pay | Admitting: Family Medicine

## 2013-07-03 DIAGNOSIS — N6320 Unspecified lump in the left breast, unspecified quadrant: Secondary | ICD-10-CM

## 2013-07-03 DIAGNOSIS — N6310 Unspecified lump in the right breast, unspecified quadrant: Secondary | ICD-10-CM

## 2013-07-03 DIAGNOSIS — N63 Unspecified lump in unspecified breast: Secondary | ICD-10-CM | POA: Diagnosis not present

## 2013-07-04 ENCOUNTER — Encounter: Payer: Self-pay | Admitting: Family Medicine

## 2013-07-04 ENCOUNTER — Ambulatory Visit (INDEPENDENT_AMBULATORY_CARE_PROVIDER_SITE_OTHER): Payer: Medicare Other | Admitting: Family Medicine

## 2013-07-04 VITALS — BP 142/78 | HR 88 | Temp 97.6°F | Resp 16 | Ht 64.5 in | Wt 234.0 lb

## 2013-07-04 DIAGNOSIS — H6980 Other specified disorders of Eustachian tube, unspecified ear: Secondary | ICD-10-CM

## 2013-07-04 DIAGNOSIS — E039 Hypothyroidism, unspecified: Secondary | ICD-10-CM

## 2013-07-04 DIAGNOSIS — R350 Frequency of micturition: Secondary | ICD-10-CM

## 2013-07-04 DIAGNOSIS — H9190 Unspecified hearing loss, unspecified ear: Secondary | ICD-10-CM | POA: Diagnosis not present

## 2013-07-04 DIAGNOSIS — H6990 Unspecified Eustachian tube disorder, unspecified ear: Secondary | ICD-10-CM

## 2013-07-04 DIAGNOSIS — M25569 Pain in unspecified knee: Secondary | ICD-10-CM | POA: Diagnosis not present

## 2013-07-04 DIAGNOSIS — N63 Unspecified lump in unspecified breast: Secondary | ICD-10-CM | POA: Diagnosis not present

## 2013-07-04 LAB — POCT URINALYSIS DIPSTICK
Bilirubin, UA: NEGATIVE
Glucose, UA: NEGATIVE
Ketones, UA: NEGATIVE
Leukocytes, UA: NEGATIVE
Nitrite, UA: NEGATIVE
Protein, UA: NEGATIVE
Spec Grav, UA: <=1.005
Urobilinogen, UA: 0.2
pH, UA: 5.5

## 2013-07-04 LAB — POCT UA - MICROSCOPIC ONLY
Bacteria, U Microscopic: NEGATIVE
Casts, Ur, LPF, POC: NEGATIVE
Crystals, Ur, HPF, POC: NEGATIVE
Mucus, UA: NEGATIVE
WBC, Ur, HPF, POC: NEGATIVE
Yeast, UA: NEGATIVE

## 2013-07-04 NOTE — Progress Notes (Signed)
73 yo widowed woman (husband was attorney) and son lives in [place].  Patient is undergoing breast imaging and biopsy for cancer possibility in right and left breast.  CT scan done for shortness of breath 2 weeks ago and the irregularity in right breast was detected.  She had a mammogram recently which showed spiculated area with right axillary adenopathy.  Biopsy scheduled for next week.  She has urinary frequency, and levaquin was prescribed.  It made her dizzy so she stopped it  Her left leg still hurts behind the left knee.  Her venous doppler was negative recently.  She continues on the coumadin and has coumadin clinic appt tomorrow.  Her ears also bother her with loss of hearing and fullness despite the levaquin.  Objective:  NAD Using a cane Ear drums has normal light reflex but are dull and do not move easily with insufflation.  Decreased hearing Legs: normal SLR, no muscle wasting, FROM Venous doppler normal Results for orders placed in visit on 07/04/13  POCT UA - MICROSCOPIC ONLY      Result Value Range   WBC, Ur, HPF, POC neg     RBC, urine, microscopic 0-2     Bacteria, U Microscopic neg     Mucus, UA neg     Epithelial cells, urine per micros 3-12     Crystals, Ur, HPF, POC neg     Casts, Ur, LPF, POC neg     Yeast, UA neg    POCT URINALYSIS DIPSTICK      Result Value Range   Color, UA lt yellow     Clarity, UA clear     Glucose, UA neg     Bilirubin, UA neg     Ketones, UA neg     Spec Grav, UA <=1.005     Blood, UA trace-lysed     pH, UA 5.5     Protein, UA neg     Urobilinogen, UA 0.2     Nitrite, UA neg     Leukocytes, UA Negative       Assessment:  Anxiety, ETD, breast abnormality with workup in progress, irritable bladder. Follow up with Dr. Everlene Farrier after the breast studies, then I will see her in March..  She needs reassurance ENT referral Frequency of urination - Plan: POCT UA - Microscopic Only, POCT urinalysis dipstick  ETD (eustachian tube  dysfunction) - Plan: Ambulatory referral to ENT  Hearing loss - Plan: Ambulatory referral to ENT  Signed, [name], MD

## 2013-07-04 NOTE — Progress Notes (Signed)
Make sure patient returns to clinic to talk to me after she has undergone her biopsies

## 2013-07-05 ENCOUNTER — Ambulatory Visit (INDEPENDENT_AMBULATORY_CARE_PROVIDER_SITE_OTHER): Payer: Medicare Other | Admitting: General Practice

## 2013-07-05 DIAGNOSIS — I82409 Acute embolism and thrombosis of unspecified deep veins of unspecified lower extremity: Secondary | ICD-10-CM | POA: Diagnosis not present

## 2013-07-05 DIAGNOSIS — H905 Unspecified sensorineural hearing loss: Secondary | ICD-10-CM | POA: Diagnosis not present

## 2013-07-05 DIAGNOSIS — Z01818 Encounter for other preprocedural examination: Secondary | ICD-10-CM | POA: Insufficient documentation

## 2013-07-05 DIAGNOSIS — R490 Dysphonia: Secondary | ICD-10-CM | POA: Diagnosis not present

## 2013-07-05 DIAGNOSIS — H903 Sensorineural hearing loss, bilateral: Secondary | ICD-10-CM | POA: Diagnosis not present

## 2013-07-05 DIAGNOSIS — Z5181 Encounter for therapeutic drug level monitoring: Secondary | ICD-10-CM | POA: Diagnosis not present

## 2013-07-05 DIAGNOSIS — I82402 Acute embolism and thrombosis of unspecified deep veins of left lower extremity: Secondary | ICD-10-CM

## 2013-07-05 LAB — TSH: TSH: 2.384 u[IU]/mL (ref 0.350–4.500)

## 2013-07-05 LAB — POCT INR: INR: 3.5

## 2013-07-05 NOTE — Patient Instructions (Signed)
Instructions for holding coumadin pre and post procedure.  2/6 - Stop coumadin until after procedure  2/12 - Procedure (No coumadin)  2/13 - Re-start coumadin and take 15 mg (3 tablets) 2/14 - Take 15 mg (3 tablets) 2/15 - Take 10 mg (2 tablets) 2/16 - Take 10 mg (2 tablets) 2/17 - Take 10 mg (2 tablets)  2/18 - Re-check INR in coumadin clinic.  Patient verbalizes understanding.

## 2013-07-05 NOTE — Progress Notes (Signed)
Pre-visit discussion using our clinic review tool. No additional management support is needed unless otherwise documented below in the visit note.  

## 2013-07-11 ENCOUNTER — Other Ambulatory Visit: Payer: Self-pay | Admitting: Family Medicine

## 2013-07-11 ENCOUNTER — Ambulatory Visit
Admission: RE | Admit: 2013-07-11 | Discharge: 2013-07-11 | Disposition: A | Discharge status: Home / Self Care | Payer: Medicare Other | Source: Ambulatory Visit | Attending: Family Medicine | Admitting: Family Medicine

## 2013-07-11 DIAGNOSIS — N6310 Unspecified lump in the right breast, unspecified quadrant: Secondary | ICD-10-CM

## 2013-07-11 DIAGNOSIS — C50919 Malignant neoplasm of unspecified site of unspecified female breast: Secondary | ICD-10-CM | POA: Diagnosis not present

## 2013-07-11 DIAGNOSIS — C773 Secondary and unspecified malignant neoplasm of axilla and upper limb lymph nodes: Secondary | ICD-10-CM | POA: Diagnosis not present

## 2013-07-11 DIAGNOSIS — N6002 Solitary cyst of left breast: Secondary | ICD-10-CM

## 2013-07-11 DIAGNOSIS — R599 Enlarged lymph nodes, unspecified: Secondary | ICD-10-CM | POA: Diagnosis not present

## 2013-07-11 DIAGNOSIS — N6320 Unspecified lump in the left breast, unspecified quadrant: Secondary | ICD-10-CM

## 2013-07-11 DIAGNOSIS — N63 Unspecified lump in unspecified breast: Secondary | ICD-10-CM | POA: Diagnosis not present

## 2013-07-11 DIAGNOSIS — N6009 Solitary cyst of unspecified breast: Secondary | ICD-10-CM | POA: Diagnosis not present

## 2013-07-11 DIAGNOSIS — D0590 Unspecified type of carcinoma in situ of unspecified breast: Secondary | ICD-10-CM | POA: Diagnosis not present

## 2013-07-12 ENCOUNTER — Ambulatory Visit
Admission: RE | Admit: 2013-07-12 | Discharge: 2013-07-12 | Disposition: A | Discharge status: Home / Self Care | Payer: Medicare Other | Source: Ambulatory Visit | Attending: Family Medicine | Admitting: Family Medicine

## 2013-07-12 ENCOUNTER — Other Ambulatory Visit: Payer: Self-pay | Admitting: Family Medicine

## 2013-07-12 DIAGNOSIS — N6310 Unspecified lump in the right breast, unspecified quadrant: Secondary | ICD-10-CM

## 2013-07-12 DIAGNOSIS — C50919 Malignant neoplasm of unspecified site of unspecified female breast: Secondary | ICD-10-CM

## 2013-07-12 DIAGNOSIS — R599 Enlarged lymph nodes, unspecified: Secondary | ICD-10-CM | POA: Diagnosis not present

## 2013-07-12 HISTORY — DX: Malignant neoplasm of unspecified site of unspecified female breast: C50.919

## 2013-07-15 ENCOUNTER — Telehealth: Payer: Self-pay | Admitting: *Deleted

## 2013-07-15 DIAGNOSIS — C50411 Malignant neoplasm of upper-outer quadrant of right female breast: Secondary | ICD-10-CM | POA: Insufficient documentation

## 2013-07-15 DIAGNOSIS — Z170 Estrogen receptor positive status [ER+]: Secondary | ICD-10-CM

## 2013-07-15 NOTE — Telephone Encounter (Signed)
Left message for a return phone call to schedule patient for [facility] 07/17/13.

## 2013-07-15 NOTE — Telephone Encounter (Signed)
Confirmed [facility] for 2/18/15at 0800 .  Instructions and contact information given. 

## 2013-07-16 ENCOUNTER — Other Ambulatory Visit: Payer: Medicare Other

## 2013-07-16 ENCOUNTER — Telehealth: Payer: Self-pay | Admitting: *Deleted

## 2013-07-16 ENCOUNTER — Inpatient Hospital Stay: Admission: RE | Admit: 2013-07-16 | Payer: Medicare Other | Source: Ambulatory Visit

## 2013-07-16 NOTE — Telephone Encounter (Signed)
Received call from patient stating she is cancelling her appointment for 07/17/13 [facility] due to the weather.  Informed her that [facility] is closed today and would give her a call tomorrow to reschedule or refer her to a surgeon.  Left message for Leigh at [facility].

## 2013-07-17 ENCOUNTER — Other Ambulatory Visit: Payer: Medicare Other

## 2013-07-17 ENCOUNTER — Ambulatory Visit: Payer: Medicare Other | Admitting: Radiation Oncology

## 2013-07-17 ENCOUNTER — Ambulatory Visit: Payer: Medicare Other | Admitting: Oncology

## 2013-07-17 ENCOUNTER — Ambulatory Visit: Payer: Medicare Other

## 2013-07-17 ENCOUNTER — Ambulatory Visit (INDEPENDENT_AMBULATORY_CARE_PROVIDER_SITE_OTHER): Payer: Medicare Other | Admitting: General Practice

## 2013-07-17 ENCOUNTER — Ambulatory Visit (INDEPENDENT_AMBULATORY_CARE_PROVIDER_SITE_OTHER): Payer: Medicare Other | Admitting: Surgery

## 2013-07-17 ENCOUNTER — Ambulatory Visit: Payer: Medicare Other | Admitting: Physical Therapy

## 2013-07-17 DIAGNOSIS — I82409 Acute embolism and thrombosis of unspecified deep veins of unspecified lower extremity: Secondary | ICD-10-CM | POA: Diagnosis not present

## 2013-07-17 DIAGNOSIS — Z7901 Long term (current) use of anticoagulants: Secondary | ICD-10-CM | POA: Diagnosis not present

## 2013-07-17 DIAGNOSIS — I82402 Acute embolism and thrombosis of unspecified deep veins of left lower extremity: Secondary | ICD-10-CM

## 2013-07-17 LAB — POCT INR: INR: 2.1

## 2013-07-17 NOTE — Progress Notes (Signed)
Pre-visit discussion using our clinic review tool. No additional management support is needed unless otherwise documented below in the visit note.  

## 2013-07-18 ENCOUNTER — Ambulatory Visit (INDEPENDENT_AMBULATORY_CARE_PROVIDER_SITE_OTHER): Payer: Medicare Other | Admitting: Physician Assistant

## 2013-07-18 ENCOUNTER — Encounter: Payer: Self-pay | Admitting: Physician Assistant

## 2013-07-18 ENCOUNTER — Telehealth: Payer: Self-pay | Admitting: *Deleted

## 2013-07-18 ENCOUNTER — Other Ambulatory Visit: Payer: Self-pay | Admitting: *Deleted

## 2013-07-18 VITALS — BP 144/84 | HR 75 | Temp 98.1°F | Resp 18 | Ht 63.25 in | Wt 234.8 lb

## 2013-07-18 DIAGNOSIS — C50419 Malignant neoplasm of upper-outer quadrant of unspecified female breast: Secondary | ICD-10-CM

## 2013-07-18 DIAGNOSIS — C50411 Malignant neoplasm of upper-outer quadrant of right female breast: Secondary | ICD-10-CM

## 2013-07-18 DIAGNOSIS — E039 Hypothyroidism, unspecified: Secondary | ICD-10-CM

## 2013-07-18 DIAGNOSIS — F32A Depression, unspecified: Secondary | ICD-10-CM | POA: Insufficient documentation

## 2013-07-18 DIAGNOSIS — F329 Major depressive disorder, single episode, unspecified: Secondary | ICD-10-CM | POA: Insufficient documentation

## 2013-07-18 DIAGNOSIS — J040 Acute laryngitis: Secondary | ICD-10-CM | POA: Diagnosis not present

## 2013-07-18 NOTE — Telephone Encounter (Signed)
Called and spoke with patient about [facility] 07/24/13.  Patient cancelled her appointment on 07/17/13 due to the weather. I rescheduled her MRI to 07/23/13 and confirmed appointment for [facility] 07/24/13 at 8am.  Contact information given.

## 2013-07-18 NOTE — Patient Instructions (Signed)
Proceed with the MRI of your breasts and the visit at the [facility] next week, as planned.

## 2013-07-18 NOTE — Progress Notes (Signed)
   Subjective:    Patient ID: Alicia Vasquez, female    DOB: January 05, 1941, 73 y.o.   MRN: [id number]   PCP: [name], MD  Chief Complaint  Patient presents with  . Follow-up    follow up from ent, being hoarse, reports from mammogram and biopsy    HPI Presents for follow-up regarding probable breast cancer found on chest CT during evaluation for pulmonary embolus, and laryngitis. Dr. Joseph Art asked that I see her in his absence.  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  Due to recent winter weather, her MRI was rescheduled for 2/24 and visit in the multidisciplinary clinic for 2/25. She's feeling ok about all of this, though she's still totally surprised.  She had a normal chest CT in 11/2012, and we reviewed that together.  She has some significant tenderness in the RIGHT breast and axilla from the biopsies and has a big bruise from one of the breast biopsies on the RIGHT. She denies pain from the biopsy on the LEFT. Her son will attend the Multidisciplinary Appointment with her next week.  She knows Dr. Humphrey Rolls from a family member's cancer treatment.  She saw ENT for the laryngitis and states, "They didn't do anything." She was under the impression that they were going to perform a procedure or study. She reports that she was examined and that no new medication was prescribed.  Her symptoms are unchanged. I do not have a note from the visit, but suspect that it is in Dr. Pauletta Browns box for his review.  Review of Systems As above.    Objective:   Physical Exam  Vitals reviewed. Constitutional: She is oriented to person, place, and time. She appears well-developed. She is active and cooperative. No distress.  Ambulates with a cane  Eyes: Conjunctivae are normal.  Neck: Neck supple. No thyromegaly present.  Pulmonary/Chest: Effort normal. Right breast exhibits no inverted nipple. Left breast exhibits no inverted nipple.     Lymphadenopathy:    She has no cervical adenopathy.  Neurological: She is alert and oriented to person, place, and time.  Skin: Skin is warm and dry.  Psychiatric: She has a normal mood and affect. Her behavior is normal. Thought content normal.          Assessment & Plan:  1. Breast cancer of upper-outer quadrant of right female breast Anticipatory guidance provided.  Proceed with the MRI and Multidisciplinary Team Visit next week.  We'll follow-up after that.  2. Laryngitis I'll find the notes from her ENT visit and let her know the next step in evaluation/treatment.  Based on chart review, she's due for a number of health maintenance items: tetanus and Zoster vaccinations, influenza vaccine (she declines these annually, again today), colonoscopy.  Expect these can be performed over the next year.  Fara Chute, PA-C Physician Assistant-Certified [facility]

## 2013-07-21 ENCOUNTER — Ambulatory Visit (HOSPITAL_COMMUNITY)
Admission: RE | Admit: 2013-07-21 | Discharge: 2013-07-21 | Disposition: A | Discharge status: Home / Self Care | Payer: Medicare Other | Source: Ambulatory Visit | Attending: Physician Assistant | Admitting: Physician Assistant

## 2013-07-21 ENCOUNTER — Ambulatory Visit (INDEPENDENT_AMBULATORY_CARE_PROVIDER_SITE_OTHER): Payer: Medicare Other | Admitting: Internal Medicine

## 2013-07-21 ENCOUNTER — Ambulatory Visit (HOSPITAL_COMMUNITY)
Admission: RE | Admit: 2013-07-21 | Discharge: 2013-07-21 | Disposition: A | Discharge status: Home / Self Care | Payer: Medicare Other | Source: Other Acute Inpatient Hospital | Attending: Internal Medicine | Admitting: Internal Medicine

## 2013-07-21 VITALS — BP 160/100 | HR 82 | Temp 98.0°F | Resp 16 | Ht 64.0 in | Wt 237.0 lb

## 2013-07-21 DIAGNOSIS — R55 Syncope and collapse: Secondary | ICD-10-CM | POA: Diagnosis not present

## 2013-07-21 DIAGNOSIS — R51 Headache: Secondary | ICD-10-CM

## 2013-07-21 DIAGNOSIS — S40022A Contusion of left upper arm, initial encounter: Secondary | ICD-10-CM

## 2013-07-21 DIAGNOSIS — S40029A Contusion of unspecified upper arm, initial encounter: Secondary | ICD-10-CM

## 2013-07-21 NOTE — Patient Instructions (Addendum)
I am concerned about the episode you had this morning when you felt as though you might faint, especially with the worse headache you have had since yesterday. After we see the results of the CT scan, we may need to have you evaluated at the neurologist.  Please go NOW to the CT department at [facility] to the Emergency Department and register, but then go to  RADIOLOGY (you're not going to see the emergency room doctor).  The ENT specialist said: 1. Your ear symptoms are caused by hearing loss in the LEFT ear. They want to recheck the hearing in 3 months, sooner if your symptoms worsen.  The note says they discussed ways to reduce the buzzing sound.  If you do not remember those techniques, please contact their office. 2. For the hoarseness, they advised you to eliminate caffeine (coffee, tea, soda, chocolate).  They also provided you materials to read about reflux, which can cause hoarseness. If your symptoms don't improve, they will look down your throat for additional information.

## 2013-07-21 NOTE — Progress Notes (Signed)
Subjective:    Patient ID: Alicia Vasquez, female    DOB: 12-03-1940, 73 y.o.   MRN: [id number]   PCP: [name], MD  Chief Complaint  Patient presents with  . numbness in left arm today    Medications, allergies, past medical history, surgical history, family history, social history and problem list reviewed and updated.   HPI  Alicia Vasquez presents this evening for evaluation of LEFT arm pain.  She reports that earlier today it was swollen, but that after applying ice packs, the swelling got a lot better, but it still feels sore.  Since arriving, she's become more worried about her elevated blood pressure than her arm pain.  She reports increased LEFT sided headache and LEFT ear pain since yesterday.  This morning she got up to prepare breakfast and felt suddenly like she may faint.  She leaned forward on the counter and the feeling passed, but she bumped her LEFT forearm on the cabinet.  She thinks she broke a blood vessel.  The swelling was considerable, and for a period of time had numbness and tingling in the LEFT hand and was unable to move the hand.  She reports that when the swelling improved with the ice, the ability to move the hand returned, but she still feels a little tingly and weak in the LEFT hand compared to the RIGHT.  She notes several weeks of reduced visual acuity in the LEFT eye as well, but thinks that may be due to wearing an old pair of glasses that are not the correct prescription.  No CP, SOB, nausea, vomiting.  She's having a lot of pain at the site of the RIGHT breast biopsy, and reports that the pain worsened when she was having her blood pressure checked here this evening.  She continues to have tinnitus and LEFT ear pain and laryngitis, and we reviewed the notes from the ENT together this evening.  When prompted with the information, she recalls the information they provided her and the plan to follow up (she did not recall any sort of plan for follow up  and reports they did "nothing" when I saw her last week).  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  On 06/27/2013 she had a CT chest to evaluate for PE due to CP.  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  Her breast MRI and Multidisciplinary Team visit at the [facility] were rescheduled due to last week's winter weather.  Review of Systems As above.    Objective:   Physical Exam Blood pressure 160/100, pulse 82, temperature 98 F (36.7 C), temperature source Oral, resp. rate 16, height 5\' 4"  (1.626 m), weight 237 lb (107.502 kg), SpO2 96.00%. Body mass index is 40.66 kg/(m^2). Well-developed, well nourished WF who is awake, alert and oriented, in NAD. She is anxious, and needs to hear instructions repeatedly. HEENT: /AT, PERRL, EOMI.  Sclera and conjunctiva are clear.  EAC are patent, TMs are normal in appearance. Nasal mucosa is pink and moist. OP is clear. Neck: supple, non-tender, no lymphadenopathy, thyromegaly. She reports that palpation along the LEFT neck causes sounds in her LEFT ear. Heart: RRR, no murmur Lungs: normal effort, CTA Abdomen: normo-active bowel sounds, supple, non-tender, no mass or organomegaly. Extremities: no cyanosis, clubbing. Good ROM  of the LEFT arm. Ecchymosis consistent with contusion of the forearm.  She reports reduced sensation on the LEFT compared to the RIGHT hand, but strength in the upper extremities are equal. Skin: warm and dry without rash. There is resolving ecchymosis of the RIGHT upper outer breast, consistent with biopsy. Psychologic: good mood and appropriate affect, normal speech and behavior. Requires repeated instructions.  As she was leaving, someone leaving another room accidentally opened the door into her RIGHT shoulder.  She  reported pain and feeling woozy.  She sat down with resolution of the woozy feeling, though reported that the pain in the back of her shoulder was significant.  There was no ecchymosis or swelling, no erythema on exam.      Assessment & Plan:  1. Headache(784.0) 2. Near syncope Concern for bleed, due to Coumadin therapy.  Also concern for metastasis from breast cancer, given the need for repeated counseling and instructions.  May need evaluation with neurology. - CT Head Wo Contrast; Future  3. Contusion of arm, left Continue ice, rest.  She has MRI breast on 2/24, and Multidisciplinary Team meeting 2/25.  She is scheduled to follow-up with me on 3/05, and then will follow-up with her PCP, Dr. Joseph Art upon his return.  Alicia Chute, PA-C Physician Assistant-Certified [facility]    CT of the head was negative and patient was sent home to follow up as planned I participated in this patient's care and have reviewed and agree with documentation. Robert P. Laney Pastor, M.D.

## 2013-07-22 ENCOUNTER — Telehealth: Payer: Self-pay

## 2013-07-22 NOTE — Telephone Encounter (Signed)
Patient called to ask whether or not we have received her scan results from yesterday. She had a scan at the hospital yesterday, she states. She also has an MRI that is scheduled for tomorrow morning at 9:30 am and says that she is not feeling well. She states she just wants some advice on what to do. Please return her call at [phone]. Thank you.

## 2013-07-23 ENCOUNTER — Inpatient Hospital Stay: Admission: RE | Admit: 2013-07-23 | Payer: Medicare Other | Source: Ambulatory Visit

## 2013-07-23 NOTE — Telephone Encounter (Signed)
Patient has rescheduled for the MRI scan because of dizziness, she wants to know if she should reschedule the appointment at the cancer center. Advised her to keep the appointment at the Cancer center.  Called patient to advise.

## 2013-07-24 ENCOUNTER — Ambulatory Visit (HOSPITAL_BASED_OUTPATIENT_CLINIC_OR_DEPARTMENT_OTHER): Payer: Medicare Other | Admitting: Oncology

## 2013-07-24 ENCOUNTER — Ambulatory Visit (HOSPITAL_BASED_OUTPATIENT_CLINIC_OR_DEPARTMENT_OTHER): Payer: Medicare Other | Admitting: General Surgery

## 2013-07-24 ENCOUNTER — Encounter: Payer: Self-pay | Admitting: *Deleted

## 2013-07-24 ENCOUNTER — Other Ambulatory Visit (HOSPITAL_BASED_OUTPATIENT_CLINIC_OR_DEPARTMENT_OTHER): Payer: Medicare Other

## 2013-07-24 ENCOUNTER — Ambulatory Visit
Admission: RE | Admit: 2013-07-24 | Discharge: 2013-07-24 | Disposition: A | Discharge status: Home / Self Care | Payer: Medicare Other | Source: Ambulatory Visit | Attending: Radiation Oncology | Admitting: Radiation Oncology

## 2013-07-24 ENCOUNTER — Telehealth (INDEPENDENT_AMBULATORY_CARE_PROVIDER_SITE_OTHER): Payer: Self-pay

## 2013-07-24 ENCOUNTER — Encounter (INDEPENDENT_AMBULATORY_CARE_PROVIDER_SITE_OTHER): Payer: Self-pay

## 2013-07-24 ENCOUNTER — Encounter: Payer: Self-pay | Admitting: Oncology

## 2013-07-24 ENCOUNTER — Ambulatory Visit: Payer: Medicare Other | Attending: General Surgery | Admitting: Physical Therapy

## 2013-07-24 ENCOUNTER — Other Ambulatory Visit: Payer: Medicare Other

## 2013-07-24 ENCOUNTER — Encounter (INDEPENDENT_AMBULATORY_CARE_PROVIDER_SITE_OTHER): Payer: Self-pay | Admitting: General Surgery

## 2013-07-24 ENCOUNTER — Ambulatory Visit: Payer: Medicare Other

## 2013-07-24 VITALS — BP 163/90 | HR 92 | Temp 97.3°F | Resp 19 | Ht 64.0 in | Wt 235.1 lb

## 2013-07-24 DIAGNOSIS — C50411 Malignant neoplasm of upper-outer quadrant of right female breast: Secondary | ICD-10-CM

## 2013-07-24 DIAGNOSIS — R0602 Shortness of breath: Secondary | ICD-10-CM | POA: Insufficient documentation

## 2013-07-24 DIAGNOSIS — R293 Abnormal posture: Secondary | ICD-10-CM | POA: Insufficient documentation

## 2013-07-24 DIAGNOSIS — C50911 Malignant neoplasm of unspecified site of right female breast: Secondary | ICD-10-CM

## 2013-07-24 DIAGNOSIS — IMO0001 Reserved for inherently not codable concepts without codable children: Secondary | ICD-10-CM | POA: Insufficient documentation

## 2013-07-24 DIAGNOSIS — Z7901 Long term (current) use of anticoagulants: Secondary | ICD-10-CM | POA: Diagnosis not present

## 2013-07-24 DIAGNOSIS — Z9181 History of falling: Secondary | ICD-10-CM | POA: Diagnosis not present

## 2013-07-24 DIAGNOSIS — C50419 Malignant neoplasm of upper-outer quadrant of unspecified female breast: Secondary | ICD-10-CM

## 2013-07-24 DIAGNOSIS — Z170 Estrogen receptor positive status [ER+]: Secondary | ICD-10-CM

## 2013-07-24 DIAGNOSIS — I10 Essential (primary) hypertension: Secondary | ICD-10-CM | POA: Insufficient documentation

## 2013-07-24 DIAGNOSIS — C773 Secondary and unspecified malignant neoplasm of axilla and upper limb lymph nodes: Secondary | ICD-10-CM | POA: Diagnosis not present

## 2013-07-24 DIAGNOSIS — I824Z9 Acute embolism and thrombosis of unspecified deep veins of unspecified distal lower extremity: Secondary | ICD-10-CM | POA: Insufficient documentation

## 2013-07-24 DIAGNOSIS — C50919 Malignant neoplasm of unspecified site of unspecified female breast: Secondary | ICD-10-CM | POA: Insufficient documentation

## 2013-07-24 LAB — CBC WITH DIFFERENTIAL/PLATELET
BASO%: 2.2 % — AB (ref 0.0–2.0)
Basophils Absolute: 0.2 10*3/uL — ABNORMAL HIGH (ref 0.0–0.1)
EOS%: 3.5 % (ref 0.0–7.0)
Eosinophils Absolute: 0.2 10*3/uL (ref 0.0–0.5)
HCT: 45.8 % (ref 34.8–46.6)
HGB: 15.3 g/dL (ref 11.6–15.9)
LYMPH%: 26.9 % (ref 14.0–49.7)
MCH: 31.2 pg (ref 25.1–34.0)
MCHC: 33.5 g/dL (ref 31.5–36.0)
MCV: 93.2 fL (ref 79.5–101.0)
MONO#: 1.1 10*3/uL — ABNORMAL HIGH (ref 0.1–0.9)
MONO%: 16.4 % — ABNORMAL HIGH (ref 0.0–14.0)
NEUT#: 3.5 10*3/uL (ref 1.5–6.5)
NEUT%: 51 % (ref 38.4–76.8)
Platelets: 229 10*3/uL (ref 145–400)
RBC: 4.91 10*6/uL (ref 3.70–5.45)
RDW: 14.5 % (ref 11.2–14.5)
WBC: 6.9 10*3/uL (ref 3.9–10.3)
lymph#: 1.9 10*3/uL (ref 0.9–3.3)

## 2013-07-24 LAB — COMPREHENSIVE METABOLIC PANEL (CC13)
ALT: 25 U/L (ref 0–55)
ANION GAP: 8 meq/L (ref 3–11)
AST: 17 U/L (ref 5–34)
Albumin: 3.9 g/dL (ref 3.5–5.0)
Alkaline Phosphatase: 81 U/L (ref 40–150)
BILIRUBIN TOTAL: 0.4 mg/dL (ref 0.20–1.20)
BUN: 12.2 mg/dL (ref 7.0–26.0)
CALCIUM: 10.1 mg/dL (ref 8.4–10.4)
CHLORIDE: 108 meq/L (ref 98–109)
CO2: 26 mEq/L (ref 22–29)
CREATININE: 0.8 mg/dL (ref 0.6–1.1)
Glucose: 117 mg/dl (ref 70–140)
Potassium: 4.1 mEq/L (ref 3.5–5.1)
SODIUM: 142 meq/L (ref 136–145)
TOTAL PROTEIN: 6.9 g/dL (ref 6.4–8.3)

## 2013-07-24 NOTE — Progress Notes (Signed)
Patient ID: Alicia Vasquez, female   DOB: [birth year], 73 y.o.   MRN: [id number]  Chief Complaint  Patient presents with  . Other    HPI Alicia Vasquez is a 73 y.o. female.  Referred by Dr Alicia Vasquez HPI 13 yof who has history of LE DVT on coumadin who underwent a chest ct recently that showed a 1.5 cm right breast mass and an enlarged right axillary lymph node.  She has since undergone an ultrasound that shows a 1.3x1.2x1.5 cm right breast mass with a 1.6 cm right axillary node.  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  She comes in today to discuss her options.  Past Medical History  Diagnosis Date  . Allergy   . Hypertension   . Hypothyroid   . Blood transfusion without reported diagnosis   . Pneumonia   . DVT (deep vein thrombosis) in pregnancy   . Breast cancer 07/12/13    invasive mammary carcinoma  . Anxiety   . Arthritis     Past Surgical History  Procedure Laterality Date  . Removal of teeth  04/19/2012    13 teeth removed  . Biospy      female organs    Family History  Problem Relation Age of Onset  . Cancer Brother     prostate  . Heart disease Brother   . Colon cancer Brother     Social History History  Substance Use Topics  . Smoking status: Never Smoker   . Smokeless tobacco: Not on file  . Alcohol Use: No    Allergies  Allergen Reactions  . Anesthetics, Amide Other (See Comments)    ELEVATE BLOOD PRESSURE  . Benadryl [Diphenhydramine Hcl]     dizziness  . Carbocaine [Mepivacaine Hcl] Other (See Comments)    Elevated blood pressure  . Codeine Nausea Only and Other (See Comments)    dizziness  . Epinephrine Other (See Comments)    Elevated blood pressure  . Sulfa Antibiotics Other (See Comments)    dizziness  . Vicodin [Hydrocodone-Acetaminophen] Nausea Only  . Penicillins Rash    Current Outpatient Prescriptions   Medication Sig Dispense Refill  . albuterol (PROVENTIL HFA;VENTOLIN HFA) 108 (90 BASE) MCG/ACT inhaler Inhale 2 puffs into the lungs every 6 (six) hours as needed.  1 Inhaler  0  . lisinopril (PRINIVIL,ZESTRIL) 10 MG tablet Take 1 tablet (10 mg total) by mouth daily.  90 tablet  3  . SYNTHROID 100 MCG tablet Take 1 tablet (100 mcg total) by mouth daily.  90 tablet  3  . warfarin (COUMADIN) 5 MG tablet Take as directed by anticoagulation clinic  60 tablet  3   Current Facility-Administered Medications  Medication Dose Route Frequency Provider Last Rate Last Dose  . ipratropium (ATROVENT) nebulizer solution 0.5 mg  0.5 mg Nebulization Once Alicia Rasmussen, MD        Review of Systems Review of Systems  Constitutional: Negative for fever, chills and unexpected weight change.  HENT: Positive for hearing loss and sinus pressure. Negative for congestion, sore throat, trouble swallowing and voice change.   Eyes: Negative for visual disturbance.  Respiratory: Positive for cough and shortness of breath. Negative for wheezing.   Cardiovascular: Positive for chest pain and palpitations. Negative for leg swelling.  Gastrointestinal: Negative for nausea, vomiting, abdominal pain, diarrhea, constipation, blood in stool,  abdominal distention and anal bleeding.  Genitourinary: Negative for hematuria, vaginal bleeding and difficulty urinating.  Musculoskeletal: Positive for arthralgias and back pain.  Skin: Negative for rash and wound.  Neurological: Negative for seizures, syncope and headaches.  Hematological: Negative for adenopathy. Bruises/bleeds easily.  Psychiatric/Behavioral: Negative for confusion.    There were no vitals taken for this visit.  Physical Exam Physical Exam  Vitals reviewed. Constitutional: She appears well-developed and well-nourished.  Neck: Neck supple.  Cardiovascular: Normal rate, regular rhythm and normal heart sounds.   Pulmonary/Chest: Effort normal and breath  sounds normal. She has no wheezes. She has no rales. Right breast exhibits no inverted nipple, no mass, no nipple discharge, no skin change and no tenderness. Left breast exhibits no inverted nipple, no mass, no nipple discharge, no skin change and no tenderness.  Abdominal: Soft. There is no tenderness.  Lymphadenopathy:    She has no cervical adenopathy.    She has axillary adenopathy.       Right axillary: Lateral adenopathy present.       Left axillary: No lateral adenopathy present.      Right: No supraclavicular adenopathy present.       Left: No supraclavicular adenopathy present.    Data Reviewed ULTRASOUND BILATERAL BREAST  DIGITAL BREAST TOMOSYNTHESIS Digital breast tomosynthesis images are  acquired in two projections. These images are reviewed in  combination with the digital mammogram, confirming the findings  below.  COMPARISON:  Prior examinations dating back to 10/02/2009.  ACR BREAST DENSITY:  ACR Breast Density Category c: The breast tissue is heterogeneously  dense, which may obscure small masses.  FINDINGS:  There is a 1.5 cm spiculated mass within the upper-outer quadrant of  the right breast posterior depth. Additionally there is an enlarged  right axillary lymph node.  Within the left breast 11 o'clock position anterior depth there is a  1.5 cm mass underlying the palpable marker. Additionally within the  1 o'clock position there is a 1.1 cm circumscribed mass within the  middle depth.  Mammographic images were processed with CAD.  On physical exam, I palpate no discrete mass within either breast.  Ultrasound is performed, showing a 1.3 x 1.2 x 1.5 cm irregular  hypoechoic mass with internal vascularity within the right breast 11  o'clock position 8 cm from the nipple. Additionally there is a bulky  enlarged right axillary lymph node measuring up to 1.6 cm.  Within the left breast 11 o'clock position 3 cm from the nipple  there is a 1.1 x 0.9 x 1.4 cm oval  hypoechoic circumscribed mass  with mixed internal echogenicity. No internal vascularity is  identified. Additionally within the left breast 1 o'clock position 4  cm from the nipple there is a 0.9 x 0.6 x 0.9 cm simple cyst. No  left axillary lymphadenopathy.  IMPRESSION:  1. Suspicious right breast mass with enlarged right axillary lymph  node concerning for primary breast malignancy and metastatic  adenopathy. Ultrasound-guided core needle biopsy of both the  suspicious right breast mass and enlarged right axillary lymph node  is recommended. Potentially markedly complicated cyst versus solid  mass within the left breast 11 o'clock position at the site of  patient's reported palpable abnormality. Cyst aspiration versus  ultrasound-guided core needle biopsy of this mass is recommended.  RECOMMENDATION:  1. Ultrasound-guided core needle biopsy suspicious right breast mass  and enlarged right axillary lymph node. Diagnostic cyst aspiration  versus core needle biopsy of complicated cyst versus polyp mass  within the anterior aspect of the left breast 11 o'clock position.  Procedures scheduled for 07/11/2013 at 1 p.m.  I have discussed the findings and recommendations with the patient.  Results were also provided in writing at the conclusion of the  visit.   Assessment    Clinical stage II right breast cancer    Plan    Right breast radioactive seed guided lumpectomy and targeted right axillary node excision  She needs evaluation by pcp and possibly cardiology prior to surgery.  Her sob is significant at rest and sleeps on pillows.   We discussed the staging and pathophysiology of breast cancer. We discussed all of the different options for treatment for breast cancer including surgery, chemotherapy, radiation therapy, Herceptin, and antiestrogen therapy.   We discussed an axillary node dissection vs an excision of involved node. I don't think full alnd is needed and will give her  any benefit more than excision of the involved node followed by axillary radiotherapy.  Her risk of LE is significant and I cannot promise her that her survival will be any better. We discussed the options for treatment of the breast cancer which included lumpectomy versus a mastectomy. We discussed the performance of the lumpectomy with a wire placement. We discussed up to a 5% chance of a positive margin requiring reexcision in the operating room. We also discussed that she may need radiation therapy or antiestrogen therapy or both if she undergoes lumpectomy. We discussed the mastectomy and the postoperative care for that as well. We discussed that there is no difference in her survival whether she undergoes lumpectomy with radiation therapy or antiestrogen therapy versus a mastectomy.  We discussed the risks of operation including bleeding, infection, possible reoperation. She understands her further therapy will be based on what her stages at the time of her operation.         Sherie Dobrowolski 07/24/2013, 12:56 PM

## 2013-07-24 NOTE — Telephone Encounter (Signed)
Called to schedule pt an appt for medical clearance and they told me the first available would not be till April for Dr Joseph Art. I said we needed something sooner than that b/c this pt has breast cancer and then they told me the pt already has an appt for next week 3/5 with the P.A. Daphane Shepherd. I notified them that I will fax over a request for medical clearance along with Dr Cristal Generous note from today. [phone]. I will call pt.

## 2013-07-24 NOTE — Progress Notes (Signed)
Checked in new patient with no financial issues. She had her [facility] packet. I gave her appt card. She didn't have [facility] or medicaid card with her.

## 2013-07-24 NOTE — Progress Notes (Signed)
Alicia Vasquez [id number] 1941/03/14 73 y.o. 07/24/2013 9:25 AM  CC  [name], MD [facility] [id number], [address]   [place]  [place] [address] [name] Dr. Thea Silversmith  REASON FOR CONSULTATION:  73 y/o female with right breast cancer  Stage II(T1N1)  STAGE:  Breast cancer of upper-outer quadrant of right female breast   Primary site: Breast (Right)   Staging method: AJCC 7th Edition   Clinical: Stage IIA (T1, N1, cM0)   Summary: Stage IIA (T1, N1, cM0)   REFERRING PHYSICIAN: Dr. [name]  HISTORY OF PRESENT ILLNESS:  Alicia Vasquez is a 73 y.o. female.  Who presented with SOB and had a CT chest performed that revealed a right breast mass. Mammogram/ultrsound on 2/13 showed a mass in the 11 o'clock position in the right breast measuring 1.5 cm. Also noted was a right axillary LN measuring 1.6 cm. MRI not performed. Biopsy of mass and lymph done. 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 Lymph node + for metastatic carcinoma. Her case discussed in the [facility] conference and she is seen in the [facility].  Patient is doing well, she continues to have SOB, and multiple other chronic complaints documented in the medical record.   Past Medical History: Past Medical History  Diagnosis Date  . Allergy   . Hypertension   . Hypothyroid   . Blood transfusion without reported diagnosis   . Pneumonia   . DVT (deep vein thrombosis) in pregnancy   . Breast cancer 07/12/13    invasive mammary carcinoma    Past Surgical History: Past Surgical History  Procedure Laterality Date  . Removal of teeth  04/19/2012    13 teeth removed  . Biospy      female organs    Family History: Family History  Problem Relation Age of Onset  . Cancer Brother     prostate  . Heart disease Brother     Social History History  Substance Use Topics  . Smoking status: Never  Smoker   . Smokeless tobacco: Not on file  . Alcohol Use: No    Allergies: Allergies  Allergen Reactions  . Anesthetics, Amide Other (See Comments)    ELEVATE BLOOD PRESSURE  . Benadryl [Diphenhydramine Hcl]   . Carbocaine [Mepivacaine Hcl]   . Codeine   . Epinephrine   . Penicillins   . Sulfa Antibiotics   . Vicodin [Hydrocodone-Acetaminophen]     Current Medications: Current Outpatient Prescriptions  Medication Sig Dispense Refill  . albuterol (PROVENTIL HFA;VENTOLIN HFA) 108 (90 BASE) MCG/ACT inhaler Inhale 2 puffs into the lungs every 6 (six) hours as needed.  1 Inhaler  0  . lisinopril (PRINIVIL,ZESTRIL) 10 MG tablet Take 1 tablet (10 mg total) by mouth daily.  90 tablet  3  . SYNTHROID 100 MCG tablet Take 1 tablet (100 mcg total) by mouth daily.  90 tablet  3  . warfarin (COUMADIN) 5 MG tablet Take as directed by anticoagulation clinic  60 tablet  3   Current Facility-Administered Medications  Medication Dose Route Frequency Provider Last Rate Last Dose  . ipratropium (ATROVENT) nebulizer solution 0.5 mg  0.5 mg Nebulization Once Hayden Rasmussen, MD        OB/GYN History: menarche at 43, postmenopausal, no HRT, first live birth at 69, G14P1  Fertility Discussion: n/a Prior History of Cancer: no  Health Maintenance:  Colonoscopy no Bone Density yes  Last PAP smear 3 yrs ago  ECOG PERFORMANCE STATUS: 2 - Symptomatic, <50% confined to bed  Genetic Counseling/testing: no  REVIEW OF SYSTEMS:  14 point review of systems is scanned separately.  PHYSICAL EXAMINATION: Blood pressure 163/90, pulse 92, temperature 97.3 F (36.3 C), temperature source Oral, resp. rate 19, height _0  (1.626 m), weight 235 lb 2 oz (106.652 kg).  General:  well-nourished in no acute distress.  Eyes:  no scleral icterus.  ENT:  There were no oropharyngeal lesions.  Neck was without thyromegaly.  Lymphatics:  Negative cervical, supraclavicular or axillary adenopathy.  Respiratory: lungs  were clear bilaterally without wheezing or crackles.  Cardiovascular:  Regular rate and rhythm, S1/S2, without murmur, rub or gallop.  There was no pedal edema.  GI:  abdomen was soft, flat, nontender, nondistended, without organomegaly.  Muscoloskeletal:  no spinal tenderness of palpation of vertebral spine.  Skin exam was without echymosis, petichae.  Neuro exam was nonfocal.  Patient was able to get on and off exam table without assistance.  Gait was normal.  Patient was alerted and oriented.  Attention was good.   Language was appropriate.  Mood was normal without depression.  Speech was not pressured.  Thought content was not tangential.   Breasts: right breast normal without mass, skin or nipple changes or axillary nodes area of ecchymosis and palpable hematoma, left breast normal without mass, skin or nipple changes or axillary nodes.   STUDIES/RESULTS: Ct Head Wo Contrast  07/21/2013   CLINICAL DATA:  Headache near syncope. Patient currently on Coumadin.  EXAM: CT HEAD WITHOUT CONTRAST  TECHNIQUE: Contiguous axial images were obtained from the base of the skull through the vertex without intravenous contrast.  COMPARISON:  None.  FINDINGS: No intracranial abnormalities are identified, including mass lesion or mass effect, hydrocephalus, extra-axial fluid collection, midline shift, hemorrhage, or acute infarction. The visualized bony calvarium is unremarkable.  IMPRESSION: Unremarkable noncontrast head CT   Electronically Signed   By: Hassan Rowan M.D.   On: 07/21/2013 21:34   Ct Angio Chest Pe W/cm &/or Wo Cm  06/27/2013   CLINICAL DATA:  Short of breath  EXAM: CT ANGIOGRAPHY CHEST WITH CONTRAST  TECHNIQUE: Multidetector CT imaging of the chest was performed using the standard protocol during bolus administration of intravenous contrast. Multiplanar CT image reconstructions including MIPs were obtained to evaluate the vascular anatomy.  CONTRAST:  153m OMNIPAQUE IOHEXOL 350 MG/ML SOLN  COMPARISON:   12/11/2012  FINDINGS: There are no filling defects in the pulmonary arterial tree to suggest acute pulmonary thromboembolism.  New 1.5 cm spiculated mass in the outer right breast associated with right axillary adenopathy. 13 mm short axis diameter right axillary node on image 22 of series 4.  No pericardial effusion.  No abnormal mediastinal adenopathy.  Calcified granulomata are present.  Otherwise clear.  No acute bony deformity.  Stable T12 compression deformity.  Review of the MIP images confirms the above findings.  IMPRESSION: No evidence of acute pulmonary thromboembolism.  New spiculated mass in the right breast associated with right axillary adenopathy. Malignancy is not excluded. Mammogram is recommended.   Electronically Signed   By: AMaryclare BeanM.D.   On: 06/27/2013 15:59   [place]  07/11/2013   CLINICAL DATA:  1.4 cm mass or complex cyst in the 11 o'clock position of the left breast at recent mammography and ultrasound.  EXAM: ULTRASOUND GUIDED LEFT BREAST CYST ASPIRATION  COMPARISON:  Previous exams.  PROCEDURE: Using sterile technique, 1%  lidocaine, ultrasound guidance, and an 18 gauge needle, aspiration was performed of the recently demonstrated 1.4 cm mass in the 11 o'clock position of the left breast. 0.8 cc of moderately thick, brown fluid was aspirated. This resulted in complete resolution of the mass.  IMPRESSION: Ultrasound-guided aspiration of a 1.4 cm complex left breast cyst. No apparent complications.   Electronically Signed   By: Enrique Sack M.D.   On: 07/11/2013 15:30   Mm Digital Diagnostic Unilat R  07/11/2013   CLINICAL DATA:  Status post right breast ultrasound-guided core needle biopsy.  EXAM: POST-BIOPSY CLIP PLACEMENT BILATERAL DIAGNOSTIC MAMMOGRAM  COMPARISON:  Previous exams.  FINDINGS: Films are performed following ultrasound guided biopsy of a 1.5 cm mass in the 11 o'clock position of the right breast. These demonstrate a ribbon shaped clip within the biopsied mass.   IMPRESSION: Appropriate clip deployment following right breast ultrasound-guided core needle biopsy.  Final Assessment: Post Procedure Mammograms for Marker Placement   Electronically Signed   By: Enrique Sack M.D.   On: 07/11/2013 15:24   Mm Radiologist Eval And Mgmt  07/12/2013   EXAM: ESTABLISHED PATIENT OFFICE VISIT -LEVEL II (85027)  HISTORY OF PRESENT ILLNESS: 1.5 cm mass in the 11 o'clock position of the right breast and abnormal right axillary lymph node biopsied under ultrasound guidance yesterday. The final pathological diagnosis is invasive mammary carcinoma and metastatic lymph node.  CHIEF COMPLAINT: Status post ultrasound-guided core needle biopsy of a right breast mass and right axillary lymph node yesterday. The patient reports no symptoms today.  PHYSICAL EXAMINATION: The patient has mild bruising and tenderness at the biopsy sites in the outer right breast and inferior right axilla. No palpable hematoma.  ASSESSMENT AND PLAN: The final pathological diagnosis of right breast invasive mammary carcinoma and metastatic right axillary lymph node are concordant with the imaging findings. The patient will be seen in the Multidisciplinary Clinic on 07/17/2013. She was given an appointment for a bilateral breast MRI at [place] at [facility] at 9:15 a.m. on 07/16/2013.   Electronically Signed   By: Enrique Sack M.D.   On: 07/12/2013 15:20   US Breast Ltd Uni Left Inc Axilla  07/03/2013   CLINICAL DATA:  Patient for evaluation of right breast mass identified on prior chest CT. Additionally patient reports a palpable mass within the left breast.  EXAM: DIGITAL DIAGNOSTIC  BILATERAL MAMMOGRAM WITH CAD  ULTRASOUND BILATERAL BREAST  DIGITAL BREAST TOMOSYNTHESIS  Digital breast tomosynthesis images are acquired in two projections. These images are reviewed in combination with the digital mammogram, confirming the findings below.  COMPARISON:  Prior examinations dating back to  10/02/2009.  ACR Breast Density Category c: The breast tissue is heterogeneously dense, which may obscure small masses.  FINDINGS: There is a 1.5 cm spiculated mass within the upper-outer quadrant of the right breast posterior depth. Additionally there is an enlarged right axillary lymph node.  Within the left breast 11 o'clock position anterior depth there is a 1.5 cm mass underlying the palpable marker. Additionally within the 1 o'clock position there is a 1.1 cm circumscribed mass within the middle depth.  Mammographic images were processed with CAD.  On physical exam, I palpate no discrete mass within either breast.  Ultrasound is performed, showing a 1.3 x 1.2 x 1.5 cm irregular hypoechoic mass with internal vascularity within the right breast 11 o'clock position 8 cm from the nipple. Additionally there is a bulky enlarged right axillary lymph node measuring up  to 1.6 cm.  Within the left breast 11 o'clock position 3 cm from the nipple there is a 1.1 x 0.9 x 1.4 cm oval hypoechoic circumscribed mass with mixed internal echogenicity. No internal vascularity is identified. Additionally within the left breast 1 o'clock position 4 cm from the nipple there is a 0.9 x 0.6 x 0.9 cm simple cyst. No left axillary lymphadenopathy.  IMPRESSION: 1. Suspicious right breast mass with enlarged right axillary lymph node concerning for primary breast malignancy and metastatic adenopathy. Ultrasound-guided core needle biopsy of both the suspicious right breast mass and enlarged right axillary lymph node is recommended. 2. Potentially markedly complicated cyst versus solid mass within the left breast 11 o'clock position at the site of patient's reported palpable abnormality. Cyst aspiration versus ultrasound-guided core needle biopsy of this mass is recommended.  RECOMMENDATION: 1. Ultrasound-guided core needle biopsy suspicious right breast mass and enlarged right axillary lymph node. 2. Diagnostic cyst aspiration versus core  needle biopsy of complicated cyst versus polyp mass within the anterior aspect of the left breast 11 o'clock position. 3. Procedures scheduled for 07/11/2013 at 1 p.m. I have discussed the findings and recommendations with the patient. Results were also provided in writing at the conclusion of the visit.  BI-RADS CATEGORY  5: Highly suggestive of malignancy - appropriate action should be taken.   Electronically Signed   By: Lovey Newcomer M.D.   On: 07/03/2013 16:39   US Breast Ltd Uni Right Inc Axilla  07/24/2013   CLINICAL DATA: Patient for evaluation of right breast mass identified on prior chest CT. Additionally patient reports a palpable mass within the left breast.  EXAM: DIGITAL DIAGNOSTIC BILATERAL MAMMOGRAM WITH CAD  ULTRASOUND BILATERAL BREAST  DIGITAL BREAST TOMOSYNTHESIS Digital breast tomosynthesis images are acquired in two projections. These images are reviewed in combination with the digital mammogram, confirming the findings below.  COMPARISON: Prior examinations dating back to 10/02/2009.  ACR BREAST DENSITY: ACR Breast Density Category c: The breast tissue is heterogeneously dense, which may obscure small masses.  FINDINGS: There is a 1.5 cm spiculated mass within the upper-outer quadrant of the right breast posterior depth. Additionally there is an enlarged right axillary lymph node.  Within the left breast 11 o'clock position anterior depth there is a 1.5 cm mass underlying the palpable marker. Additionally within the 1 o'clock position there is a 1.1 cm circumscribed mass within the middle depth.  Mammographic images were processed with CAD.  On physical exam, I palpate no discrete mass within either breast.  Ultrasound is performed, showing a 1.3 x 1.2 x 1.5 cm irregular hypoechoic mass with internal vascularity within the right breast 11 o'clock position 8 cm from the nipple. Additionally there is a bulky enlarged right axillary lymph node measuring up to 1.6 cm.  Within the left breast 11  o'clock position 3 cm from the nipple there is a 1.1 x 0.9 x 1.4 cm oval hypoechoic circumscribed mass with mixed internal echogenicity. No internal vascularity is identified. Additionally within the left breast 1 o'clock position 4 cm from the nipple there is a 0.9 x 0.6 x 0.9 cm simple cyst. No left axillary lymphadenopathy.  IMPRESSION:  1. Suspicious right breast mass with enlarged right axillary lymph node concerning for primary breast malignancy and metastatic adenopathy. Ultrasound-guided core needle biopsy of both the suspicious right breast mass and enlarged right axillary lymph node is recommended. Potentially markedly complicated cyst versus solid mass within the left breast 11 o'clock position at the site  of patient's reported palpable abnormality. Cyst aspiration versus ultrasound-guided core needle biopsy of this mass is recommended.  RECOMMENDATION:  1. Ultrasound-guided core needle biopsy suspicious right breast mass and enlarged right axillary lymph node. Diagnostic cyst aspiration versus core needle biopsy of complicated cyst versus polyp mass within the anterior aspect of the left breast 11 o'clock position. Procedures scheduled for 07/11/2013 at 1 p.m. I have discussed the findings and recommendations with the patient. Results were also provided in writing at the conclusion of the visit.  BI-RADS CATEGORY: 5: Highly suggestive of malignancy - appropriate action should be taken.   Electronically Signed   By: Lovey Newcomer M.D.   On: 07/24/2013 08:21   Mm Diag Breast Tomo Bilateral  07/03/2013   CLINICAL DATA:  Patient for evaluation of right breast mass identified on prior chest CT. Additionally patient reports a palpable mass within the left breast.  EXAM: DIGITAL DIAGNOSTIC  BILATERAL MAMMOGRAM WITH CAD  ULTRASOUND BILATERAL BREAST  DIGITAL BREAST TOMOSYNTHESIS  Digital breast tomosynthesis images are acquired in two projections. These images are reviewed in combination with the digital  mammogram, confirming the findings below.  COMPARISON:  Prior examinations dating back to 10/02/2009.  ACR Breast Density Category c: The breast tissue is heterogeneously dense, which may obscure small masses.  FINDINGS: There is a 1.5 cm spiculated mass within the upper-outer quadrant of the right breast posterior depth. Additionally there is an enlarged right axillary lymph node.  Within the left breast 11 o'clock position anterior depth there is a 1.5 cm mass underlying the palpable marker. Additionally within the 1 o'clock position there is a 1.1 cm circumscribed mass within the middle depth.  Mammographic images were processed with CAD.  On physical exam, I palpate no discrete mass within either breast.  Ultrasound is performed, showing a 1.3 x 1.2 x 1.5 cm irregular hypoechoic mass with internal vascularity within the right breast 11 o'clock position 8 cm from the nipple. Additionally there is a bulky enlarged right axillary lymph node measuring up to 1.6 cm.  Within the left breast 11 o'clock position 3 cm from the nipple there is a 1.1 x 0.9 x 1.4 cm oval hypoechoic circumscribed mass with mixed internal echogenicity. No internal vascularity is identified. Additionally within the left breast 1 o'clock position 4 cm from the nipple there is a 0.9 x 0.6 x 0.9 cm simple cyst. No left axillary lymphadenopathy.  IMPRESSION: 1. Suspicious right breast mass with enlarged right axillary lymph node concerning for primary breast malignancy and metastatic adenopathy. Ultrasound-guided core needle biopsy of both the suspicious right breast mass and enlarged right axillary lymph node is recommended. 2. Potentially markedly complicated cyst versus solid mass within the left breast 11 o'clock position at the site of patient's reported palpable abnormality. Cyst aspiration versus ultrasound-guided core needle biopsy of this mass is recommended.  RECOMMENDATION: 1. Ultrasound-guided core needle biopsy suspicious right breast  mass and enlarged right axillary lymph node. 2. Diagnostic cyst aspiration versus core needle biopsy of complicated cyst versus polyp mass within the anterior aspect of the left breast 11 o'clock position. 3. Procedures scheduled for 07/11/2013 at 1 p.m. I have discussed the findings and recommendations with the patient. Results were also provided in writing at the conclusion of the visit.  BI-RADS CATEGORY  5: Highly suggestive of malignancy - appropriate action should be taken.   Electronically Signed   By: Lovey Newcomer M.D.   On: 07/03/2013 16:39   [name] Rt Breast Bx W  Loc Dev 1st Lesion Img Bx Spec US Guide  07/11/2013   CLINICAL DATA:  1.5 cm mass in the 11 o'clock position of the right breast with imaging features suspicious for malignancy.  EXAM: ULTRASOUND GUIDED RIGHT BREAST CORE NEEDLE BIOPSY WITH VACUUM ASSIST  COMPARISON:  Previous exams.  PROCEDURE: I met with the patient and we discussed the procedure of ultrasound-guided biopsy, including benefits and alternatives. We discussed the high likelihood of a successful procedure. We discussed the risks of the procedure including infection, bleeding, tissue injury, clip migration, and inadequate sampling. Informed written consent was given. The usual time-out protocol was performed immediately prior to the procedure.  Using sterile technique and 2% Lidocaine as local anesthetic, under direct ultrasound visualization, a 12 gauge vacuum-assisteddevice was used to perform biopsy of the recently demonstrated 1.5 cm mass in the 11 o'clock position of the right breast using a medial approach. At the conclusion of the procedure, a tissue marker clip was deployed into the biopsy cavity. Follow-up 2-view mammogram was performed and dictated separately.  IMPRESSION: Ultrasound-guided biopsy of a 1.5 cm 11 o'clock right breast mass. No apparent complications.   Electronically Signed   By: Enrique Sack M.D.   On: 07/11/2013 15:06   [name] Rt Breast Bx W Loc Dev Ea Add  Lesion Img Bx Spec US Guide  07/11/2013   CLINICAL DATA:  Abnormal appearing right axillary lymph node at recent ultrasound. 1.5 cm mass in the right breast suspicious for malignancy.  EXAM: ULTRASOUND GUIDED CORE NEEDLE BIOPSY OF A RIGHT AXILLARY NODE  COMPARISON:  Previous exams.  FINDINGS: I met with the patient and we discussed the procedure of ultrasound-guided biopsy, including benefits and alternatives. We discussed the high likelihood of a successful procedure. We discussed the risks of the procedure, including infection, bleeding, tissue injury, clip migration, and inadequate sampling. Informed written consent was given. The usual time-out protocol was performed immediately prior to the procedure.  Using sterile technique and 2% Lidocaine as local anesthetic, under direct ultrasound visualization, a 14 gauge spring-loaded device was used to perform biopsy of the recently demonstrated abnormal lymph node in the right axilla using an inferior approach.  IMPRESSION: Ultrasound guided biopsy of an abnormal right axillary lymph node. No apparent complications.   Electronically Signed   By: Enrique Sack M.D.   On: 07/11/2013 15:09     LABS:    Chemistry      Component Value Date/Time   NA 142 07/24/2013 0823   NA 139 02/13/2013 1810   K 4.1 07/24/2013 0823   K 4.1 02/13/2013 1810   CL 103 02/13/2013 1810   CO2 26 07/24/2013 0823   CO2 30 02/13/2013 1810   BUN 12.2 07/24/2013 0823   BUN 12 02/13/2013 1810   CREATININE 0.8 07/24/2013 0823   CREATININE 0.80 02/13/2013 1810      Component Value Date/Time   CALCIUM 10.1 07/24/2013 0823   CALCIUM 10.5 02/13/2013 1810   ALKPHOS 81 07/24/2013 0823   ALKPHOS 79 02/13/2013 1810   AST 17 07/24/2013 0823   AST 16 02/13/2013 1810   ALT 25 07/24/2013 0823   ALT 21 02/13/2013 1810   BILITOT 0.40 07/24/2013 0823   BILITOT 0.4 02/13/2013 1810      Lab Results  Component Value Date   WBC 6.9 07/24/2013   HGB 15.3 07/24/2013   HCT 45.8 07/24/2013   MCV 93.2  07/24/2013   PLT 229 07/24/2013     PATHOLOGY 07/11/13 ADDITIONAL INFORMATION: 1.  PROGNOSTIC INDICATORS - ACIS Results: IMMUNOHISTOCHEMICAL AND MORPHOMETRIC ANALYSIS BY THE AUTOMATED CELLULAR IMAGING SYSTEM (ACIS) Estrogen Receptor: 100%, POSITIVE, STRONG STAINING INTENSITY Progesterone Receptor: 100%, POSITIVE, STRONG STAINING INTENSITY Proliferation Marker Ki67: 17% REFERENCE RANGE ESTROGEN RECEPTOR NEGATIVE <1% POSITIVE =>1% PROGESTERONE RECEPTOR NEGATIVE <1% POSITIVE =>1% All controls stained appropriately Enid Cutter MD Pathologist, Electronic Signature ( Signed 07/17/2013) 1. CHROMOGENIC IN-SITU HYBRIDIZATION Results: HER-2/NEU BY CISH - NO AMPLIFICATION OF HER-2 DETECTED. RESULT RATIO OF HER2: CEP 17 SIGNALS 1.37 AVERAGE HER2 COPY NUMBER PER CELL 1.85 REFERENCE RANGE 1 of 3 FINAL for Yeldell, Perrie E ([id number]) ADDITIONAL INFORMATION:(continued) NEGATIVE HER2/Chr17 Ratio <2.0 and Average HER2 copy number <4.0 EQUIVOCAL HER2/Chr17 Ratio <2.0 and Average HER2 copy number 4.0 and <6.0 POSITIVE HER2/Chr17 Ratio >=2.0 and/or Average HER2 copy number >=6.0 Enid Cutter MD Pathologist, Electronic Signature ( Signed 07/17/2013) FINAL DIAGNOSIS Diagnosis 1. Breast, right, needle core biopsy, mass, 11 o'clock - INVASIVE MAMMARY CARCINOMA. - MAMMARY CARCINOMA IN SITU. - SEE COMMENT. 2. Lymph node, needle/core biopsy, right axilla - METASTATIC CARCINOMA IN 1 OF 1 LYMPH NODE (1/1). Microscopic Comment 1. The finding favor grade I invasive ductal carcinoma. A breast prognostic profile will be performed and the results reported separately. The results were called to [facility] on 07/12/13. 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 Patient also  found to have a psoitive LN. Tumor is ER/PR positive and her2Neu negative with ki-67 at 17%  #2 We spent the better part of today's hour-long appointment discussing the biology of breast cancer in general, and the specifics of the patient's tumor in particular. We discussed the pathology and radiology in detail today. We also discussed the pathophysiology of patient's breast cancer. We discussed the prognostic markers. We discussed the multidisciplinary approach to treatment of breast cancer including surgery radiation oncology and medical oncology. We discussed the patient's tumor being estrogen receptor positive and progesterone receptor positive so therefore she would be eligible for antiestrogen therapy with tamoxifen or one of the aromatase inhibitors. We discussed risks and benefits and side effects of these agents. We discussed role of chemotherapy in the treatment of breast cancer. Patient understands that she may or may not receive chemotherapy based on her final pathology. We discussed Oncotype DX testing. I think this patient would be a good candidate for guidelines for Oncotype DX testing.  #3 Patient will need targeted lymph node dissection.  4. I will plan to see the patient back after the surgery to discuss adjuvant systemic treatment based on her final pathology.    Clinical Trial Eligibility:II Multidisciplinary conference discussion yes      Discussion: Patient is being treated per NCCN breast cancer care guidelines appropriate for stage.II   Thank you so much for allowing me to participate in the care of MARVELOUS WOOLFORD. I will continue to follow up the patient with you and assist in her care.  All questions were answered. The patient knows to call the clinic with any problems, questions or concerns. We can certainly see the patient much sooner if necessary.  I spent 40 minutes counseling the patient face to face. The total time spent in the appointment was 60  minutes.  Marcy Panning, MD Medical/Oncology [facility] [phone] (beeper) [phone] (Office)  07/24/2013, 9:25 AM

## 2013-07-24 NOTE — Telephone Encounter (Signed)
Called to schedule pt for a new pt appt with cardiologist to get cardiology clearance in order for her to have breast surgery. I made her an appt with Dr Martinique 07/29/13 arrive at 8:00/8:15. I notified pt of the appt and she really didn't want the 8:00 appt but I told her they were working her in to be seen b/c if not it would be a couple of weeks for an appt. The pt understands.

## 2013-07-24 NOTE — Telephone Encounter (Signed)
Called pt to notify her that she has an appt already with Daphane Shepherd on 3/5 @ 2:15 and the pt remembered this already. I asked for the pt to make sure she tells Daphane Shepherd that she needs medical clearance in order to get scheduled for surgery as well. I notified pt that I will fax over records. The pt understands.

## 2013-07-25 ENCOUNTER — Telehealth: Payer: Self-pay | Admitting: Physician Assistant

## 2013-07-25 NOTE — Telephone Encounter (Signed)
Pt has an appt scheduled with her cardiologist for cardiac clearance on Monday.   She is complaining of her arm still hurting. When her arm starts hurting she gets dizzy.

## 2013-07-25 NOTE — Progress Notes (Signed)
Radiation Oncology         [phone]) [phone] ________________________________  Initial outpatient Consultation - Date: 07/24/2013   Name: Alicia Vasquez MRN: [id number]   DOB: 1941/02/13  REFERRING PHYSICIAN: [name], MD  STAGE: Breast cancer of upper-outer quadrant of right female breast   Primary site: Breast (Right)   Staging method: AJCC 7th Edition   Clinical: Stage IIA (T1, N1, cM0)   Summary: Stage IIA (T1, N1, cM0)   Clinical comments: Staged at breast conference 07/24/13.  HISTORY OF PRESENT ILLNESS::Alicia Vasquez is a 73 y.o. female  who had a history of DVT and was on Coumadin. She presented for shortness of breath and underwent a chest CT for rule out pulmonary embolus which showed a 1.5 cm right breast mass and an enlarged right axillary lymph node. This was worked up with an ultrasound which showed a 1.3 x 1.2 x 1.5 cm right breast mass. A 1.6 cm right axillary node was also seen. She underwent a breast biopsy that showed a grade 1 invasive mammary carcinoma that was likely ductal. This was ER/PR positive HER-2 negative. Ki-67 was 17%. He denies any palpable breast findings. She denies any previous history of breast cancer. She is accompanied by her friend and her son and daughter-in-law today. She has pain in her breast after her biopsy. This is minimal. She does have significant vertigo when leaning back and also shortness of breath when lying flat.  PREVIOUS RADIATION THERAPY: No  PAST MEDICAL HISTORY:  has a past medical history of Allergy; Hypertension; Hypothyroid; Blood transfusion without reported diagnosis; Pneumonia; DVT (deep vein thrombosis) in pregnancy; Breast cancer (07/12/13); Anxiety; and Arthritis.    PAST SURGICAL HISTORY: Past Surgical History  Procedure Laterality Date  . Removal of teeth  04/19/2012    13 teeth removed  . Biospy      female organs    FAMILY HISTORY:  Family History  Problem Relation Age of Onset  . Cancer Brother    prostate  . Heart disease Brother   . Colon cancer Brother     SOCIAL HISTORY:  History  Substance Use Topics  . Smoking status: Never Smoker   . Smokeless tobacco: Not on file  . Alcohol Use: No    ALLERGIES: Anesthetics, amide; Benadryl; Carbocaine; Codeine; Epinephrine; Sulfa antibiotics; Vicodin; and Penicillins  MEDICATIONS:  Current Outpatient Prescriptions  Medication Sig Dispense Refill  . albuterol (PROVENTIL HFA;VENTOLIN HFA) 108 (90 BASE) MCG/ACT inhaler Inhale 2 puffs into the lungs every 6 (six) hours as needed.  1 Inhaler  0  . lisinopril (PRINIVIL,ZESTRIL) 10 MG tablet Take 1 tablet (10 mg total) by mouth daily.  90 tablet  3  . SYNTHROID 100 MCG tablet Take 1 tablet (100 mcg total) by mouth daily.  90 tablet  3  . warfarin (COUMADIN) 5 MG tablet Take as directed by anticoagulation clinic  60 tablet  3   Current Facility-Administered Medications  Medication Dose Route Frequency Provider Last Rate Last Dose  . ipratropium (ATROVENT) nebulizer solution 0.5 mg  0.5 mg Nebulization Once Hayden Rasmussen, MD        REVIEW OF SYSTEMS:  A 15 point review of systems is documented in the electronic medical record. This was obtained by the nursing staff. However, I reviewed this with the patient to discuss relevant findings and make appropriate changes.  Pertinent items are noted in HPI.  PHYSICAL EXAM: There were no vitals filed for this visit.. . She is pleasant female  who appears her stated age. I just Orene Desanctis her back slightly to examine her breasts. She has tenderness over her right nipple. There is a palpable right breast mass/seroma cavity in the upper outer quadrant of the right breast. Is no palpable abnormalities of the left breast. No palpable left axillary adenopathy. No palpable cervical or supraclavicular adenopathy. He has 5 out of 5 strength bilaterally. He is alert minus x3.  LABORATORY DATA:  Lab Results  Component Value Date   WBC 6.9 07/24/2013   HGB 15.3  07/24/2013   HCT 45.8 07/24/2013   MCV 93.2 07/24/2013   PLT 229 07/24/2013   Lab Results  Component Value Date   NA 142 07/24/2013   K 4.1 07/24/2013   CL 103 02/13/2013   CO2 26 07/24/2013   Lab Results  Component Value Date   ALT 25 07/24/2013   AST 17 07/24/2013   ALKPHOS 81 07/24/2013   BILITOT 0.40 07/24/2013     RADIOGRAPHY: Ct Head Wo Contrast  07/21/2013   CLINICAL DATA:  Headache near syncope. Patient currently on Coumadin.  EXAM: CT HEAD WITHOUT CONTRAST  TECHNIQUE: Contiguous axial images were obtained from the base of the skull through the vertex without intravenous contrast.  COMPARISON:  None.  FINDINGS: No intracranial abnormalities are identified, including mass lesion or mass effect, hydrocephalus, extra-axial fluid collection, midline shift, hemorrhage, or acute infarction. The visualized bony calvarium is unremarkable.  IMPRESSION: Unremarkable noncontrast head CT   Electronically Signed   By: Hassan Rowan M.D.   On: 07/21/2013 21:34   Ct Angio Chest Pe W/cm &/or Wo Cm  06/27/2013   CLINICAL DATA:  Short of breath  EXAM: CT ANGIOGRAPHY CHEST WITH CONTRAST  TECHNIQUE: Multidetector CT imaging of the chest was performed using the standard protocol during bolus administration of intravenous contrast. Multiplanar CT image reconstructions including MIPs were obtained to evaluate the vascular anatomy.  CONTRAST:  153m OMNIPAQUE IOHEXOL 350 MG/ML SOLN  COMPARISON:  12/11/2012  FINDINGS: There are no filling defects in the pulmonary arterial tree to suggest acute pulmonary thromboembolism.  New 1.5 cm spiculated mass in the outer right breast associated with right axillary adenopathy. 13 mm short axis diameter right axillary node on image 22 of series 4.  No pericardial effusion.  No abnormal mediastinal adenopathy.  Calcified granulomata are present.  Otherwise clear.  No acute bony deformity.  Stable T12 compression deformity.  Review of the MIP images confirms the above findings.  IMPRESSION:  No evidence of acute pulmonary thromboembolism.  New spiculated mass in the right breast associated with right axillary adenopathy. Malignancy is not excluded. Mammogram is recommended.   Electronically Signed   By: AMaryclare BeanM.D.   On: 06/27/2013 15:59   [place]  07/11/2013   CLINICAL DATA:  1.4 cm mass or complex cyst in the 11 o'clock position of the left breast at recent mammography and ultrasound.  EXAM: ULTRASOUND GUIDED LEFT BREAST CYST ASPIRATION  COMPARISON:  Previous exams.  PROCEDURE: Using sterile technique, 1% lidocaine, ultrasound guidance, and an 18 gauge needle, aspiration was performed of the recently demonstrated 1.4 cm mass in the 11 o'clock position of the left breast. 0.8 cc of moderately thick, brown fluid was aspirated. This resulted in complete resolution of the mass.  IMPRESSION: Ultrasound-guided aspiration of a 1.4 cm complex left breast cyst. No apparent complications.   Electronically Signed   By: SEnrique SackM.D.   On: 07/11/2013 15:30   Mm Digital Diagnostic Unilat R  07/11/2013   CLINICAL DATA:  Status post right breast ultrasound-guided core needle biopsy.  EXAM: POST-BIOPSY CLIP PLACEMENT BILATERAL DIAGNOSTIC MAMMOGRAM  COMPARISON:  Previous exams.  FINDINGS: Films are performed following ultrasound guided biopsy of a 1.5 cm mass in the 11 o'clock position of the right breast. These demonstrate a ribbon shaped clip within the biopsied mass.  IMPRESSION: Appropriate clip deployment following right breast ultrasound-guided core needle biopsy.  Final Assessment: Post Procedure Mammograms for Marker Placement   Electronically Signed   By: Enrique Sack M.D.   On: 07/11/2013 15:24   Mm Radiologist Eval And Mgmt  07/12/2013   EXAM: ESTABLISHED PATIENT OFFICE VISIT -LEVEL II (16606)  HISTORY OF PRESENT ILLNESS: 1.5 cm mass in the 11 o'clock position of the right breast and abnormal right axillary lymph node biopsied under ultrasound guidance yesterday. The final pathological  diagnosis is invasive mammary carcinoma and metastatic lymph node.  CHIEF COMPLAINT: Status post ultrasound-guided core needle biopsy of a right breast mass and right axillary lymph node yesterday. The patient reports no symptoms today.  PHYSICAL EXAMINATION: The patient has mild bruising and tenderness at the biopsy sites in the outer right breast and inferior right axilla. No palpable hematoma.  ASSESSMENT AND PLAN: The final pathological diagnosis of right breast invasive mammary carcinoma and metastatic right axillary lymph node are concordant with the imaging findings. The patient will be seen in the Multidisciplinary Clinic on 07/17/2013. She was given an appointment for a bilateral breast MRI at [place] at [facility] at 9:15 a.m. on 07/16/2013.   Electronically Signed   By: Enrique Sack M.D.   On: 07/12/2013 15:20   US Breast Ltd Uni Left Inc Axilla  07/03/2013   CLINICAL DATA:  Patient for evaluation of right breast mass identified on prior chest CT. Additionally patient reports a palpable mass within the left breast.  EXAM: DIGITAL DIAGNOSTIC  BILATERAL MAMMOGRAM WITH CAD  ULTRASOUND BILATERAL BREAST  DIGITAL BREAST TOMOSYNTHESIS  Digital breast tomosynthesis images are acquired in two projections. These images are reviewed in combination with the digital mammogram, confirming the findings below.  COMPARISON:  Prior examinations dating back to 10/02/2009.  ACR Breast Density Category c: The breast tissue is heterogeneously dense, which may obscure small masses.  FINDINGS: There is a 1.5 cm spiculated mass within the upper-outer quadrant of the right breast posterior depth. Additionally there is an enlarged right axillary lymph node.  Within the left breast 11 o'clock position anterior depth there is a 1.5 cm mass underlying the palpable marker. Additionally within the 1 o'clock position there is a 1.1 cm circumscribed mass within the middle depth.  Mammographic images were  processed with CAD.  On physical exam, I palpate no discrete mass within either breast.  Ultrasound is performed, showing a 1.3 x 1.2 x 1.5 cm irregular hypoechoic mass with internal vascularity within the right breast 11 o'clock position 8 cm from the nipple. Additionally there is a bulky enlarged right axillary lymph node measuring up to 1.6 cm.  Within the left breast 11 o'clock position 3 cm from the nipple there is a 1.1 x 0.9 x 1.4 cm oval hypoechoic circumscribed mass with mixed internal echogenicity. No internal vascularity is identified. Additionally within the left breast 1 o'clock position 4 cm from the nipple there is a 0.9 x 0.6 x 0.9 cm simple cyst. No left axillary lymphadenopathy.  IMPRESSION: 1. Suspicious right breast mass with enlarged right axillary lymph node concerning for primary breast  malignancy and metastatic adenopathy. Ultrasound-guided core needle biopsy of both the suspicious right breast mass and enlarged right axillary lymph node is recommended. 2. Potentially markedly complicated cyst versus solid mass within the left breast 11 o'clock position at the site of patient's reported palpable abnormality. Cyst aspiration versus ultrasound-guided core needle biopsy of this mass is recommended.  RECOMMENDATION: 1. Ultrasound-guided core needle biopsy suspicious right breast mass and enlarged right axillary lymph node. 2. Diagnostic cyst aspiration versus core needle biopsy of complicated cyst versus polyp mass within the anterior aspect of the left breast 11 o'clock position. 3. Procedures scheduled for 07/11/2013 at 1 p.m. I have discussed the findings and recommendations with the patient. Results were also provided in writing at the conclusion of the visit.  BI-RADS CATEGORY  5: Highly suggestive of malignancy - appropriate action should be taken.   Electronically Signed   By: Lovey Newcomer M.D.   On: 07/03/2013 16:39   US Breast Ltd Uni Right Inc Axilla  07/24/2013   CLINICAL DATA:  Patient for evaluation of right breast mass identified on prior chest CT. Additionally patient reports a palpable mass within the left breast.  EXAM: DIGITAL DIAGNOSTIC BILATERAL MAMMOGRAM WITH CAD  ULTRASOUND BILATERAL BREAST  DIGITAL BREAST TOMOSYNTHESIS Digital breast tomosynthesis images are acquired in two projections. These images are reviewed in combination with the digital mammogram, confirming the findings below.  COMPARISON: Prior examinations dating back to 10/02/2009.  ACR BREAST DENSITY: ACR Breast Density Category c: The breast tissue is heterogeneously dense, which may obscure small masses.  FINDINGS: There is a 1.5 cm spiculated mass within the upper-outer quadrant of the right breast posterior depth. Additionally there is an enlarged right axillary lymph node.  Within the left breast 11 o'clock position anterior depth there is a 1.5 cm mass underlying the palpable marker. Additionally within the 1 o'clock position there is a 1.1 cm circumscribed mass within the middle depth.  Mammographic images were processed with CAD.  On physical exam, I palpate no discrete mass within either breast.  Ultrasound is performed, showing a 1.3 x 1.2 x 1.5 cm irregular hypoechoic mass with internal vascularity within the right breast 11 o'clock position 8 cm from the nipple. Additionally there is a bulky enlarged right axillary lymph node measuring up to 1.6 cm.  Within the left breast 11 o'clock position 3 cm from the nipple there is a 1.1 x 0.9 x 1.4 cm oval hypoechoic circumscribed mass with mixed internal echogenicity. No internal vascularity is identified. Additionally within the left breast 1 o'clock position 4 cm from the nipple there is a 0.9 x 0.6 x 0.9 cm simple cyst. No left axillary lymphadenopathy.  IMPRESSION:  1. Suspicious right breast mass with enlarged right axillary lymph node concerning for primary breast malignancy and metastatic adenopathy. Ultrasound-guided core needle biopsy of both the  suspicious right breast mass and enlarged right axillary lymph node is recommended. Potentially markedly complicated cyst versus solid mass within the left breast 11 o'clock position at the site of patient's reported palpable abnormality. Cyst aspiration versus ultrasound-guided core needle biopsy of this mass is recommended.  RECOMMENDATION:  1. Ultrasound-guided core needle biopsy suspicious right breast mass and enlarged right axillary lymph node. Diagnostic cyst aspiration versus core needle biopsy of complicated cyst versus polyp mass within the anterior aspect of the left breast 11 o'clock position. Procedures scheduled for 07/11/2013 at 1 p.m. I have discussed the findings and recommendations with the patient. Results were also provided in writing at  the conclusion of the visit.  BI-RADS CATEGORY: 5: Highly suggestive of malignancy - appropriate action should be taken.   Electronically Signed   By: Lovey Newcomer M.D.   On: 07/24/2013 08:21   Mm Diag Breast Tomo Bilateral  07/03/2013   CLINICAL DATA:  Patient for evaluation of right breast mass identified on prior chest CT. Additionally patient reports a palpable mass within the left breast.  EXAM: DIGITAL DIAGNOSTIC  BILATERAL MAMMOGRAM WITH CAD  ULTRASOUND BILATERAL BREAST  DIGITAL BREAST TOMOSYNTHESIS  Digital breast tomosynthesis images are acquired in two projections. These images are reviewed in combination with the digital mammogram, confirming the findings below.  COMPARISON:  Prior examinations dating back to 10/02/2009.  ACR Breast Density Category c: The breast tissue is heterogeneously dense, which may obscure small masses.  FINDINGS: There is a 1.5 cm spiculated mass within the upper-outer quadrant of the right breast posterior depth. Additionally there is an enlarged right axillary lymph node.  Within the left breast 11 o'clock position anterior depth there is a 1.5 cm mass underlying the palpable marker. Additionally within the 1 o'clock  position there is a 1.1 cm circumscribed mass within the middle depth.  Mammographic images were processed with CAD.  On physical exam, I palpate no discrete mass within either breast.  Ultrasound is performed, showing a 1.3 x 1.2 x 1.5 cm irregular hypoechoic mass with internal vascularity within the right breast 11 o'clock position 8 cm from the nipple. Additionally there is a bulky enlarged right axillary lymph node measuring up to 1.6 cm.  Within the left breast 11 o'clock position 3 cm from the nipple there is a 1.1 x 0.9 x 1.4 cm oval hypoechoic circumscribed mass with mixed internal echogenicity. No internal vascularity is identified. Additionally within the left breast 1 o'clock position 4 cm from the nipple there is a 0.9 x 0.6 x 0.9 cm simple cyst. No left axillary lymphadenopathy.  IMPRESSION: 1. Suspicious right breast mass with enlarged right axillary lymph node concerning for primary breast malignancy and metastatic adenopathy. Ultrasound-guided core needle biopsy of both the suspicious right breast mass and enlarged right axillary lymph node is recommended. 2. Potentially markedly complicated cyst versus solid mass within the left breast 11 o'clock position at the site of patient's reported palpable abnormality. Cyst aspiration versus ultrasound-guided core needle biopsy of this mass is recommended.  RECOMMENDATION: 1. Ultrasound-guided core needle biopsy suspicious right breast mass and enlarged right axillary lymph node. 2. Diagnostic cyst aspiration versus core needle biopsy of complicated cyst versus polyp mass within the anterior aspect of the left breast 11 o'clock position. 3. Procedures scheduled for 07/11/2013 at 1 p.m. I have discussed the findings and recommendations with the patient. Results were also provided in writing at the conclusion of the visit.  BI-RADS CATEGORY  5: Highly suggestive of malignancy - appropriate action should be taken.   Electronically Signed   By: Lovey Newcomer M.D.    On: 07/03/2013 16:39   [name] Rt Breast Bx W Loc Dev 1st Lesion Img Bx Spec US Guide  07/11/2013   CLINICAL DATA:  1.5 cm mass in the 11 o'clock position of the right breast with imaging features suspicious for malignancy.  EXAM: ULTRASOUND GUIDED RIGHT BREAST CORE NEEDLE BIOPSY WITH VACUUM ASSIST  COMPARISON:  Previous exams.  PROCEDURE: I met with the patient and we discussed the procedure of ultrasound-guided biopsy, including benefits and alternatives. We discussed the high likelihood of a successful procedure. We discussed the risks  of the procedure including infection, bleeding, tissue injury, clip migration, and inadequate sampling. Informed written consent was given. The usual time-out protocol was performed immediately prior to the procedure.  Using sterile technique and 2% Lidocaine as local anesthetic, under direct ultrasound visualization, a 12 gauge vacuum-assisteddevice was used to perform biopsy of the recently demonstrated 1.5 cm mass in the 11 o'clock position of the right breast using a medial approach. At the conclusion of the procedure, a tissue marker clip was deployed into the biopsy cavity. Follow-up 2-view mammogram was performed and dictated separately.  IMPRESSION: Ultrasound-guided biopsy of a 1.5 cm 11 o'clock right breast mass. No apparent complications.   Electronically Signed   By: Enrique Sack M.D.   On: 07/11/2013 15:06   [name] Rt Breast Bx W Loc Dev Ea Add Lesion Img Bx Spec US Guide  07/11/2013   CLINICAL DATA:  Abnormal appearing right axillary lymph node at recent ultrasound. 1.5 cm mass in the right breast suspicious for malignancy.  EXAM: ULTRASOUND GUIDED CORE NEEDLE BIOPSY OF A RIGHT AXILLARY NODE  COMPARISON:  Previous exams.  FINDINGS: I met with the patient and we discussed the procedure of ultrasound-guided biopsy, including benefits and alternatives. We discussed the high likelihood of a successful procedure. We discussed the risks of the procedure, including infection,  bleeding, tissue injury, clip migration, and inadequate sampling. Informed written consent was given. The usual time-out protocol was performed immediately prior to the procedure.  Using sterile technique and 2% Lidocaine as local anesthetic, under direct ultrasound visualization, a 14 gauge spring-loaded device was used to perform biopsy of the recently demonstrated abnormal lymph node in the right axilla using an inferior approach.  IMPRESSION: Ultrasound guided biopsy of an abnormal right axillary lymph node. No apparent complications.   Electronically Signed   By: Enrique Sack M.D.   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 Heindl and her family today. We discussed the role of radiation and decreasing local failures in patients who undergo lumpectomy. We discussed that do to her significant medical comorbidities and her age that Dr. Donne Hazel would be performing a node excision for this one positive node in that radiation will be used to clean up the rest of the nodes underneath her arm. We discussed the process of simulation the placement tattoos. We discussed 6 weeks of treatment as an outpatient including the axillary lymph nodes, supraclavicular lymph nodes and her right breast. We discussed skin redness and fatigue as possible side effects. We discussed that even if she elected for mastectomy she would still need postmastectomy radiation given her positive node. I'm not sure that she will be a candidate for chemotherapy. She will meet with Dr. Humphrey Rolls to discuss this. We discussed the side effects of radiation including but not limited to skin redness and fatigue. We discussed the low likelihood of symptomatic lung and rib damage. We discussed the low likelihood of secondary malignancies. I will see her back after she has had her surgery and after she has seen Dr. Humphrey Rolls to discuss chemotherapy.   I spent 40 minutes  face to face  with the patient and more than 50% of that time was spent in counseling and/or coordination of care.   ------------------------------------------------  Thea Silversmith, MD

## 2013-07-25 NOTE — Telephone Encounter (Signed)
Please call this patient. She needs an appointment with cardiology to address her surgical clearance before the lumpectomy with Dr. Donne Hazel.  Does she have a preferred cardiologist? If not, I'll refer her to [facility].

## 2013-07-26 ENCOUNTER — Other Ambulatory Visit: Payer: Medicare Other

## 2013-07-26 NOTE — Progress Notes (Signed)
[facility] Psychosocial Distress Screening Clinical Social Work  Clinical Social Work Intern met Patient, son and friend at breast clinic.  The patient scored a 0 on the Psychosocial Distress Thermometer which indicates no distress. Clinical Social Worker Intern spoke with Patient to assess for distress and other psychosocial needs. Patient appears to have adequate support at this time.  Patient was made aware of resources and programs available.  Patient is aware to contact  [facility] as needed.   Clinical Social Worker follow up needed: no  If yes, follow up plan:   Jonita Hirota S. [place] Work Intern [employer] [phone]

## 2013-07-29 ENCOUNTER — Ambulatory Visit (INDEPENDENT_AMBULATORY_CARE_PROVIDER_SITE_OTHER): Payer: Medicare Other | Admitting: General Surgery

## 2013-07-29 ENCOUNTER — Ambulatory Visit (INDEPENDENT_AMBULATORY_CARE_PROVIDER_SITE_OTHER): Payer: Medicare Other | Admitting: Cardiology

## 2013-07-29 ENCOUNTER — Telehealth: Payer: Self-pay | Admitting: *Deleted

## 2013-07-29 ENCOUNTER — Encounter: Payer: Self-pay | Admitting: Cardiology

## 2013-07-29 VITALS — BP 140/88 | HR 71 | Ht 64.0 in | Wt 235.1 lb

## 2013-07-29 DIAGNOSIS — C50419 Malignant neoplasm of upper-outer quadrant of unspecified female breast: Secondary | ICD-10-CM

## 2013-07-29 DIAGNOSIS — C50411 Malignant neoplasm of upper-outer quadrant of right female breast: Secondary | ICD-10-CM

## 2013-07-29 DIAGNOSIS — R0602 Shortness of breath: Secondary | ICD-10-CM

## 2013-07-29 DIAGNOSIS — I10 Essential (primary) hypertension: Secondary | ICD-10-CM

## 2013-07-29 NOTE — Progress Notes (Signed)
Alicia Vasquez Date of Birth: 09-02-1940 Medical Record #[id number]  History of Present Illness: Mrs. Falls is seen today at the request of Dr. Donne Hazel for evaluation of dyspnea and pre op clearance for breast surgery. She is a 73 yo WF with history of HTN, obesity and family history of CAD. She was seen by Dr. Stanford Breed in October for evaluation of chest pain. A lexiscan myoview was recommended but was never completed due to patient's concern about doing a stress test. She has been diagnosed with a right breast mass and needs surgery. She reports a one year history of dyspnea with and without exertion. She does have a history of DVT in June 2014 and has been on coumadin. Follow up LE venous doppler in Jan. 2015 was negative. CT of the chest in July 2014 and Jan. 2015 showed no evidence of PE. She does have occ. Pains that shoot across her chest and some pain in her right rib cage. She complains of chronic hoarseness and occ. Dry cough.   Current Outpatient Prescriptions on File Prior to Visit  Medication Sig Dispense Refill  . albuterol (PROVENTIL HFA;VENTOLIN HFA) 108 (90 BASE) MCG/ACT inhaler Inhale 2 puffs into the lungs every 6 (six) hours as needed.  1 Inhaler  0  . lisinopril (PRINIVIL,ZESTRIL) 10 MG tablet Take 1 tablet (10 mg total) by mouth daily.  90 tablet  3  . SYNTHROID 100 MCG tablet Take 1 tablet (100 mcg total) by mouth daily.  90 tablet  3  . warfarin (COUMADIN) 5 MG tablet Take as directed by anticoagulation clinic  60 tablet  3   Current Facility-Administered Medications on File Prior to Visit  Medication Dose Route Frequency Provider Last Rate Last Dose  . ipratropium (ATROVENT) nebulizer solution 0.5 mg  0.5 mg Nebulization Once Hayden Rasmussen, MD        Allergies  Allergen Reactions  . Anesthetics, Amide Other (See Comments)    ELEVATE BLOOD PRESSURE  . Benadryl [Diphenhydramine Hcl]     dizziness  . Carbocaine [Mepivacaine Hcl] Other (See Comments)    Elevated  blood pressure  . Codeine Nausea Only and Other (See Comments)    dizziness  . Epinephrine Other (See Comments)    Elevated blood pressure  . Sulfa Antibiotics Other (See Comments)    dizziness  . Vicodin [Hydrocodone-Acetaminophen] Nausea Only  . Penicillins Rash    Past Medical History  Diagnosis Date  . Allergy   . Hypertension   . Hypothyroid   . Blood transfusion without reported diagnosis   . Pneumonia   . DVT (deep vein thrombosis) in pregnancy   . Breast cancer 07/12/13    invasive mammary carcinoma  . Anxiety   . Arthritis     Past Surgical History  Procedure Laterality Date  . Removal of teeth  04/19/2012    13 teeth removed  . Biospy      female organs    History  Smoking status  . Never Smoker   Smokeless tobacco  . Not on file    History  Alcohol Use No    Family History  Problem Relation Age of Onset  . Cancer Brother     prostate  . Heart disease Brother   . Colon cancer Brother     Review of Systems: As noted in HPI.  All other systems were reviewed and are negative.  Physical Exam: BP 140/88  Pulse 71  Ht 5\' 4"  (1.626 m)  Wt 235  lb 1.9 oz (106.65 kg)  BMI 40.34 kg/m2 She is an obese, hirsute, WF in NAD HEENT: [place]/AT, PERRLA,EOMI, oropharynx is clear. Neck: no adenopathy, thyromegaly, JVD or bruits. Lungs: clear CV: RRR, normal S1-2, no murmur or gallop Abdomen: soft, obese, NT. BS positive Extremities: no cyanosis, edema, cellulitis. Pulses are 2+ Neuro: alert and oriented x 3. CN II-XII intact.  LABORATORY DATA: Lab Results  Component Value Date   WBC 6.9 07/24/2013   HGB 15.3 07/24/2013   HCT 45.8 07/24/2013   PLT 229 07/24/2013   GLUCOSE 117 07/24/2013   ALT 25 07/24/2013   AST 17 07/24/2013   NA 142 07/24/2013   K 4.1 07/24/2013   CL 103 02/13/2013   CREATININE 0.8 07/24/2013   BUN 12.2 07/24/2013   CO2 26 07/24/2013   TSH 2.384 07/04/2013   INR 2.1 07/17/2013   Ecg: NSR, normal Ecg.  Echo: May 2012--Normal.  Assessment /  Plan: 1. Dyspnea. I suspect her symptoms are mostly related to deconditioning, obesity, and use of ACEi. She does have cardiac risk factors so I think it is appropriate to rule out significant ischemia. Will schedule her for a Lexiscan myoview study. If low risk may proceed with planned surgery. May want to consider switching her ACEi to alternative antihypertensive therapy but will defer to her primary.

## 2013-07-29 NOTE — Telephone Encounter (Signed)
Faxed Care Plan to PCP.  Took Care Plan to HIM to scan.  

## 2013-07-29 NOTE — Patient Instructions (Signed)
We will schedule you for a Lexiscan myoview stress test.

## 2013-07-30 ENCOUNTER — Telehealth: Payer: Self-pay | Admitting: *Deleted

## 2013-07-30 ENCOUNTER — Other Ambulatory Visit: Payer: Medicare Other

## 2013-07-30 NOTE — Telephone Encounter (Signed)
Called and spoke with patient from [facility] 07/24/13. No questions or concerns at this time. Patient is awaiting cardiac clearance before surgery.  She will be getting a stress test. Encouraged patient to call with any needs.

## 2013-07-30 NOTE — Telephone Encounter (Signed)
Tammy called from listed PCP's office stating that she received my Care Plan fax yesterday and this pt is not a current patient of their facility.  I removed them as a PCP.

## 2013-07-31 ENCOUNTER — Ambulatory Visit (INDEPENDENT_AMBULATORY_CARE_PROVIDER_SITE_OTHER): Payer: Medicare Other | Admitting: Family Medicine

## 2013-07-31 ENCOUNTER — Other Ambulatory Visit: Payer: Medicare Other

## 2013-07-31 DIAGNOSIS — I82409 Acute embolism and thrombosis of unspecified deep veins of unspecified lower extremity: Secondary | ICD-10-CM

## 2013-07-31 DIAGNOSIS — Z5181 Encounter for therapeutic drug level monitoring: Secondary | ICD-10-CM

## 2013-07-31 DIAGNOSIS — I82402 Acute embolism and thrombosis of unspecified deep veins of left lower extremity: Secondary | ICD-10-CM

## 2013-07-31 LAB — POCT INR: INR: 2.8

## 2013-07-31 NOTE — Telephone Encounter (Signed)
sw pt gv appt for 09/19/13 @ 11:15am. Pt is aware of this appt...td

## 2013-08-01 ENCOUNTER — Other Ambulatory Visit: Payer: Medicare Other

## 2013-08-01 ENCOUNTER — Ambulatory Visit (INDEPENDENT_AMBULATORY_CARE_PROVIDER_SITE_OTHER): Payer: Medicare Other | Admitting: Physician Assistant

## 2013-08-01 ENCOUNTER — Encounter: Payer: Self-pay | Admitting: Physician Assistant

## 2013-08-01 VITALS — BP 150/86 | HR 87 | Temp 98.4°F | Resp 16 | Ht 64.0 in | Wt 234.8 lb

## 2013-08-01 DIAGNOSIS — R51 Headache: Secondary | ICD-10-CM

## 2013-08-01 DIAGNOSIS — C50419 Malignant neoplasm of upper-outer quadrant of unspecified female breast: Secondary | ICD-10-CM | POA: Diagnosis not present

## 2013-08-01 DIAGNOSIS — H6980 Other specified disorders of Eustachian tube, unspecified ear: Secondary | ICD-10-CM | POA: Diagnosis not present

## 2013-08-01 DIAGNOSIS — I10 Essential (primary) hypertension: Secondary | ICD-10-CM | POA: Diagnosis not present

## 2013-08-01 DIAGNOSIS — C50411 Malignant neoplasm of upper-outer quadrant of right female breast: Secondary | ICD-10-CM

## 2013-08-01 MED ORDER — IPRATROPIUM BROMIDE 0.03 % NA SOLN: 2.0000 | Freq: Two times a day (BID) | NASAL | Status: DC

## 2013-08-01 MED ORDER — HYDROCHLOROTHIAZIDE 25 MG PO TABS: 12.5000 mg | ORAL_TABLET | Freq: Every morning | ORAL | Status: DC

## 2013-08-01 NOTE — Patient Instructions (Signed)
Apply a warm compress to the area of pain in your neck. Topical muscle products are OK to use. Stop the lisinopril. Take HCTZ each morning instead.
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
## 2013-08-08 ENCOUNTER — Ambulatory Visit (HOSPITAL_COMMUNITY): Payer: Medicare Other | Attending: Cardiovascular Disease | Admitting: Radiology

## 2013-08-08 ENCOUNTER — Telehealth: Payer: Self-pay

## 2013-08-08 ENCOUNTER — Encounter: Payer: Self-pay | Admitting: Cardiovascular Disease

## 2013-08-08 VITALS — BP 156/80 | HR 84 | Ht 64.0 in | Wt 234.0 lb

## 2013-08-08 DIAGNOSIS — I10 Essential (primary) hypertension: Secondary | ICD-10-CM

## 2013-08-08 DIAGNOSIS — C50419 Malignant neoplasm of upper-outer quadrant of unspecified female breast: Secondary | ICD-10-CM | POA: Insufficient documentation

## 2013-08-08 DIAGNOSIS — R0789 Other chest pain: Secondary | ICD-10-CM

## 2013-08-08 DIAGNOSIS — R079 Chest pain, unspecified: Secondary | ICD-10-CM

## 2013-08-08 DIAGNOSIS — R0602 Shortness of breath: Secondary | ICD-10-CM

## 2013-08-08 DIAGNOSIS — R51 Headache: Secondary | ICD-10-CM | POA: Insufficient documentation

## 2013-08-08 DIAGNOSIS — C50411 Malignant neoplasm of upper-outer quadrant of right female breast: Secondary | ICD-10-CM

## 2013-08-08 MED ORDER — TECHNETIUM TC 99M SESTAMIBI GENERIC - CARDIOLITE
33.0000 | Freq: Once | INTRAVENOUS | Status: AC | PRN
Start: 2013-08-08 — End: 2013-08-08
  Administered 2013-08-08: 33 via INTRAVENOUS

## 2013-08-08 MED ORDER — REGADENOSON 0.4 MG/5ML IV SOLN
0.4000 mg | Freq: Once | INTRAVENOUS | Status: AC
  Administered 2013-08-08: 0.4 mg via INTRAVENOUS

## 2013-08-08 MED ORDER — AMINOPHYLLINE 25 MG/ML IV SOLN
150.0000 mg | Freq: Once | INTRAVENOUS | Status: AC
  Administered 2013-08-08: 150 mg via INTRAVENOUS

## 2013-08-08 NOTE — Progress Notes (Signed)
[facility] [address] [place], [place] [address] [phone]    Cardiology Nuclear Med Study  Alicia Vasquez is a 73 y.o. female     MRN : [id number]     DOB: 09-17-1940  Procedure Date: 08/08/2013  Nuclear Med Background Indication for Stress Test:  Evaluation for Ischemia, and Pending Surgical Clearance for  (R) Breast Surgery (breast cancer)by Dr. [name] History: No prior known history of CAD, DVT, and 2012 Echo: EF=66% Cardiac Risk Factors: Family History - CAD and Hypertension  Symptoms: Chest Pain with/without exertion (last occurrence earlier today), Dizziness and DOE   Nuclear Pre-Procedure Caffeine/Decaff Intake:  None NPO After: 8:00pm   Lungs:  clear O2 Sat: 98% on room air. IV 0.9% NS with Angio Cath:  22g  IV Site: L Wrist x 1, tolerated well IV Started by:  Irven Baltimore, RN  Chest Size (in):  38 Cup Size: C  Height: 5\' 4"  (1.626 m)  Weight:  234 lb (106.142 kg)  BMI:  Body mass index is 40.15 kg/(m^2). Tech Comments:  N/A    Nuclear Med Study 1 or 2 day study: 2 day  Stress Test Type:  Lexiscan  Reading MD: N/A  Order Authorizing Provider:  Peter Martinique, MD  Resting Radionuclide: Technetium 60m Sestamibi  Resting Radionuclide Dose: 33.0 mCi on 08/12/13   Stress Radionuclide:  Technetium 52m Sestamibi  Stress Radionuclide Dose: 33.0 mCi on 08/08/13           Stress Protocol Rest HR: 84 Stress HR: 126  Rest BP: 156/80 Stress BP: 192/70  Exercise Time (min): n/a METS: n/a   Predicted Max HR: 146 bpm % Max HR: 86.3 bpm Rate Pressure Product: 24192   Dose of Adenosine (mg):  n/a Dose of Lexiscan: 0.4 mg  Dose of Atropine (mg): n/a Dose of Dobutamine: n/a mcg/kg/min (at max HR)  Stress Test Technologist: Irven Baltimore, RN  Nuclear Technologist:  Charlton Amor, CNMT     Rest Procedure:  Myocardial perfusion imaging was performed at rest 45 minutes following the intravenous administration of Technetium 47m  Sestamibi. Rest ECG: NSR - Normal EKG  Stress Procedure:  The patient received IV Lexiscan 0.4 mg over 15-seconds.  Technetium 46m Sestamibi injected at 30-seconds. The patient complained of SOB, Chest Pain, body aches all over, and headache. Quantitative spect images were obtained after a 45 minute delay. Aminophylline 75 mg IVP given 8 minutes post Lexiscan due to persistent headache and body aches that continued. A repeat dose of Aminophylline 75mg  IVP given 5 minutes later with improvement of symptoms. Stress ECG: No significant change from baseline ECG  QPS Raw Data Images:  Normal; no motion artifact; normal heart/lung ratio. Stress Images:  Normal homogeneous uptake in all areas of the myocardium. Rest Images:  Normal homogeneous uptake in all areas of the myocardium. Subtraction (SDS):  No evidence of ischemia. Transient Ischemic Dilatation (Normal <1.22):  0.95 Lung/Heart Ratio (Normal <0.45):  0.35  Quantitative Gated Spect Images QGS EDV:  69 ml QGS ESV:  18 ml  Impression Exercise Capacity:  Lexiscan with no exercise. BP Response:  Normal blood pressure response. Clinical Symptoms:  No significant symptoms noted. ECG Impression:  No significant ST segment change suggestive of ischemia. Comparison with Prior Nuclear Study: No images to compare  Overall Impression:  Normal stress nuclear study.  No evidence of ischemia.  Normal LV function   LV Ejection Fraction: 74%.  LV Wall Motion:  NL LV  Function; NL Wall Motion.   Thayer Headings, Brooke Bonito., MD, [facility] 08/12/2013, 4:53 PM Office - [phone] Pager [phone]

## 2013-08-08 NOTE — Telephone Encounter (Signed)
Patient would like to speak with dr Joseph Art regarding the stress test she had this morning she is having a lot of neck pain please call her at [phone]

## 2013-08-08 NOTE — Telephone Encounter (Signed)
Pt is still having a headache that resolves with Tylenol. She was asking about the results. Advised pt that we have not received it yet. She is asymptomatic at this time. No CP no SOB. Noted that pt had [place] Nuclear study today. Pt goes for her resting images on Monday.

## 2013-08-12 ENCOUNTER — Encounter: Payer: Self-pay | Admitting: Internal Medicine

## 2013-08-12 ENCOUNTER — Ambulatory Visit (HOSPITAL_COMMUNITY): Payer: Medicare Other | Attending: Internal Medicine

## 2013-08-12 DIAGNOSIS — R0989 Other specified symptoms and signs involving the circulatory and respiratory systems: Secondary | ICD-10-CM

## 2013-08-12 MED ORDER — TECHNETIUM TC 99M SESTAMIBI GENERIC - CARDIOLITE
33.0000 | Freq: Once | INTRAVENOUS | Status: AC | PRN
Start: 2013-08-12 — End: 2013-08-12
  Administered 2013-08-12: 33 via INTRAVENOUS

## 2013-08-13 ENCOUNTER — Telehealth (INDEPENDENT_AMBULATORY_CARE_PROVIDER_SITE_OTHER): Payer: Self-pay

## 2013-08-13 ENCOUNTER — Telehealth: Payer: Self-pay | Admitting: Cardiology

## 2013-08-13 NOTE — Telephone Encounter (Signed)
Malachy Mood called to notify me that Dr Martinique has cleared the pt from a cardiac standpoint. I will notify Dr Donne Hazel tomorrow in office b/c the pt still needs to get medical clearance.

## 2013-08-13 NOTE — Telephone Encounter (Signed)
Cleared for breast surgery from a cardiac standpoint.  Peter Martinique MD, [facility]

## 2013-08-13 NOTE — Telephone Encounter (Signed)
Spoke to [place] at Dr.Wakefield's office [place] cleared patient for surgery.

## 2013-08-13 NOTE — Telephone Encounter (Signed)
New message    Need cardiac clearance . From stress test that was done on  3/12& 3/16.

## 2013-08-13 NOTE — Telephone Encounter (Signed)
Returned call to [place] at Dr.Wakefield's office.She stated they need surgical clearance for breast lumpectomy.Message sent to [place] for clearance.

## 2013-08-13 NOTE — Telephone Encounter (Signed)
Called to check on the status of cardiac clearance with Dr Martinique. I know the pt had a stress test on 08/08/13 so I was just checking on the results. They will send message to Dr Martinique and get back with me.

## 2013-08-16 NOTE — Telephone Encounter (Signed)
Called pt again and she answered the phone. I advised pt that we did receive cardiac clearance from Dr Martinique so we were going to turn pt's surgical orders into surgery scheduling today. I know the pt has a appt next week with Dr Joseph Art for medical clearance but Dr Donne Hazel said as long as we got the cardiac clearance we could go ahead to schedule. The pt understands.

## 2013-08-19 NOTE — Telephone Encounter (Signed)
LMOM for pt to call me. I want to ask pt if she is going to need another appt to see Dr Donne Hazel before her surgery or would she be ok just speaking to Dr Donne Hazel on the phone before surgery. We are happy to do either one we just wanted to ask what she would prefer. Please advise.

## 2013-08-20 ENCOUNTER — Telehealth (INDEPENDENT_AMBULATORY_CARE_PROVIDER_SITE_OTHER): Payer: Self-pay

## 2013-08-20 ENCOUNTER — Telehealth: Payer: Self-pay

## 2013-08-20 ENCOUNTER — Ambulatory Visit (INDEPENDENT_AMBULATORY_CARE_PROVIDER_SITE_OTHER): Payer: Medicare Other | Admitting: Physician Assistant

## 2013-08-20 ENCOUNTER — Other Ambulatory Visit (INDEPENDENT_AMBULATORY_CARE_PROVIDER_SITE_OTHER): Payer: Self-pay | Admitting: General Surgery

## 2013-08-20 ENCOUNTER — Encounter: Payer: Self-pay | Admitting: Physician Assistant

## 2013-08-20 VITALS — BP 134/80 | HR 93 | Temp 98.6°F | Resp 16 | Ht 64.0 in | Wt 233.0 lb

## 2013-08-20 DIAGNOSIS — H6980 Other specified disorders of Eustachian tube, unspecified ear: Secondary | ICD-10-CM

## 2013-08-20 DIAGNOSIS — R300 Dysuria: Secondary | ICD-10-CM | POA: Diagnosis not present

## 2013-08-20 DIAGNOSIS — R5381 Other malaise: Secondary | ICD-10-CM

## 2013-08-20 DIAGNOSIS — C50411 Malignant neoplasm of upper-outer quadrant of right female breast: Secondary | ICD-10-CM

## 2013-08-20 DIAGNOSIS — I10 Essential (primary) hypertension: Secondary | ICD-10-CM

## 2013-08-20 DIAGNOSIS — J040 Acute laryngitis: Secondary | ICD-10-CM | POA: Diagnosis not present

## 2013-08-20 LAB — POCT URINALYSIS DIPSTICK
Bilirubin, UA: NEGATIVE
Blood, UA: NEGATIVE
Glucose, UA: NEGATIVE
Ketones, UA: NEGATIVE
Leukocytes, UA: NEGATIVE
Nitrite, UA: NEGATIVE
PROTEIN UA: NEGATIVE
Spec Grav, UA: 1.015
UROBILINOGEN UA: 0.2
pH, UA: 7

## 2013-08-20 LAB — POCT UA - MICROSCOPIC ONLY
CASTS, UR, LPF, POC: NEGATIVE
CRYSTALS, UR, HPF, POC: NEGATIVE
Mucus, UA: NEGATIVE
Yeast, UA: NEGATIVE

## 2013-08-20 NOTE — Progress Notes (Signed)
I have examined this patient along with the student and agree.  It is of note that this patient is extremely anxious, requiring repeated reassurances and instructions, and asking the same questions repeatedly, seeming to not listen to the answers.  As I do not know her well, I have been concerned about memory and cognitive functioning.  Those in this office who know her better indicate that they believe that she is being affcted by worry over her cancer diagnosis.

## 2013-08-20 NOTE — Telephone Encounter (Signed)
Spoke to [place] at Dr.Wakefield's office, Dr.Jordan advised ok to hold coumadin 5 days prior to surgery,does not need bridging.

## 2013-08-20 NOTE — Telephone Encounter (Signed)
Returned call to [place] at Dr.Wakefield's office Dr.Jordan advised ok to hold coumadin 5 days prior to surgery.

## 2013-08-20 NOTE — Telephone Encounter (Signed)
LMOM for pt to call me. I want to notify pt that Alicia Vasquez with Dr Doug Sou office called me to let me know that the pt can hold her Coumadin 5 days before surgery.
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
## 2013-08-20 NOTE — Patient Instructions (Addendum)
Continue your current medications as you are. Contact your dentist about the broken tooth. Use the nasal spray as prescribed.

## 2013-08-21 ENCOUNTER — Other Ambulatory Visit (INDEPENDENT_AMBULATORY_CARE_PROVIDER_SITE_OTHER): Payer: Self-pay | Admitting: General Surgery

## 2013-08-21 DIAGNOSIS — C50411 Malignant neoplasm of upper-outer quadrant of right female breast: Secondary | ICD-10-CM

## 2013-08-21 LAB — URINE CULTURE
Colony Count: NO GROWTH
Organism ID, Bacteria: NO GROWTH

## 2013-08-21 NOTE — Telephone Encounter (Signed)
LMOM for pt to call me back again. The pt has not returned my call from yesterday.

## 2013-08-21 NOTE — Telephone Encounter (Signed)
Pt called and instructed to take her last Coumadin on 09/04/13 and hold all days after that for surgery.  She will be allowed to restart her Coumadin after surgery.  She wrote down the instruction and verbalizes understanding.

## 2013-08-26 ENCOUNTER — Other Ambulatory Visit: Payer: Self-pay | Admitting: *Deleted

## 2013-08-26 ENCOUNTER — Encounter (HOSPITAL_COMMUNITY): Payer: Self-pay | Admitting: Pharmacy Technician

## 2013-08-26 DIAGNOSIS — R350 Frequency of micturition: Secondary | ICD-10-CM

## 2013-08-26 MED ORDER — OXYBUTYNIN CHLORIDE 5 MG PO TABS: 5.0000 mg | ORAL_TABLET | Freq: Two times a day (BID) | ORAL | Status: DC

## 2013-08-27 ENCOUNTER — Telehealth: Payer: Self-pay | Admitting: *Deleted

## 2013-08-27 NOTE — Telephone Encounter (Signed)
Called and spoke with patient to reschedule her appointment due to her oncotype results will not be back.  Confirmed appt. For 10/08/13 at 12N.  Encouraged patient to call with any needs.

## 2013-08-28 ENCOUNTER — Ambulatory Visit (INDEPENDENT_AMBULATORY_CARE_PROVIDER_SITE_OTHER): Payer: Medicare Other | Admitting: General Practice

## 2013-08-28 DIAGNOSIS — I82402 Acute embolism and thrombosis of unspecified deep veins of left lower extremity: Secondary | ICD-10-CM

## 2013-08-28 DIAGNOSIS — Z5181 Encounter for therapeutic drug level monitoring: Secondary | ICD-10-CM | POA: Diagnosis not present

## 2013-08-28 DIAGNOSIS — I82409 Acute embolism and thrombosis of unspecified deep veins of unspecified lower extremity: Secondary | ICD-10-CM | POA: Diagnosis not present

## 2013-08-28 LAB — POCT INR: INR: 2.7

## 2013-08-28 NOTE — Patient Instructions (Addendum)
Follow instructions given by Dr. Donne Hazel (Surgeon).

## 2013-08-28 NOTE — Progress Notes (Signed)
Pre visit review using our clinic review tool, if applicable. No additional management support is needed unless otherwise documented below in the visit note. 

## 2013-09-02 ENCOUNTER — Telehealth: Payer: Self-pay

## 2013-09-02 ENCOUNTER — Encounter (HOSPITAL_COMMUNITY): Admission: RE | Admit: 2013-09-02 | Payer: Medicare Other | Source: Ambulatory Visit

## 2013-09-02 NOTE — Telephone Encounter (Signed)
Patient walked in office.She stated she wanted to let Dr.Jordan know she has had a history of DVT in 09/2012 or 10/2012.Stated she would like that corrected on her report.Patient was told Dr.Jordan's 08/08/13 office note says history of DVT in June 2014 and has been on coumadin.Follow up lower ext dopplers in 05/2013 negative.

## 2013-09-03 ENCOUNTER — Telehealth (INDEPENDENT_AMBULATORY_CARE_PROVIDER_SITE_OTHER): Payer: Self-pay

## 2013-09-03 ENCOUNTER — Encounter (HOSPITAL_BASED_OUTPATIENT_CLINIC_OR_DEPARTMENT_OTHER): Payer: Self-pay | Admitting: *Deleted

## 2013-09-03 NOTE — Progress Notes (Signed)
09/03/13 1153  OBSTRUCTIVE SLEEP APNEA  Have you ever been diagnosed with sleep apnea through a sleep study? No  Do you snore loudly (loud enough to be heard through closed doors)?  0  Do you often feel tired, fatigued, or sleepy during the daytime? 0  Has anyone observed you stop breathing during your sleep? 0  Do you have, or are you being treated for high blood pressure? 1  BMI more than 35 kg/m2? 1  Age over 74 years old? 1  Neck circumference greater than 40 cm/18 inches? 1  Gender: 0  Obstructive Sleep Apnea Score 4  Score 4 or greater  Results sent to PCP

## 2013-09-03 NOTE — Progress Notes (Signed)
Pt very nervous-she was told to go to [facility] or for preop-then surgery chg to [facility]-talked with her-and made sure she wrote down instructions-she will go for seeds 4/9-then come here for labs-called dr Melissa Montane office for orders-she has permission to go off coumadin from dr jordan-and cardiac clearance- She is to bring all meds and overnight bag dos

## 2013-09-03 NOTE — Telephone Encounter (Signed)
Pt's sx got switched from [facility] to [facility] for sx on 09/09/13. They are needing orders at [facility] to do preop. The pt will be coming to [facility] on 09/05/13 after her seed placement.

## 2013-09-05 ENCOUNTER — Ambulatory Visit: Payer: Medicare Other | Admitting: Family Medicine

## 2013-09-05 ENCOUNTER — Ambulatory Visit
Admission: RE | Admit: 2013-09-05 | Discharge: 2013-09-05 | Disposition: A | Discharge status: Home / Self Care | Payer: Medicare Other | Source: Ambulatory Visit | Attending: General Surgery | Admitting: General Surgery

## 2013-09-05 ENCOUNTER — Other Ambulatory Visit (INDEPENDENT_AMBULATORY_CARE_PROVIDER_SITE_OTHER): Payer: Self-pay | Admitting: General Surgery

## 2013-09-05 ENCOUNTER — Encounter (HOSPITAL_BASED_OUTPATIENT_CLINIC_OR_DEPARTMENT_OTHER)
Admission: RE | Admit: 2013-09-05 | Discharge: 2013-09-05 | Disposition: A | Discharge status: Home / Self Care | Payer: Medicare Other | Source: Ambulatory Visit | Attending: General Surgery | Admitting: General Surgery

## 2013-09-05 DIAGNOSIS — C50411 Malignant neoplasm of upper-outer quadrant of right female breast: Secondary | ICD-10-CM

## 2013-09-05 DIAGNOSIS — Z01812 Encounter for preprocedural laboratory examination: Secondary | ICD-10-CM | POA: Insufficient documentation

## 2013-09-05 DIAGNOSIS — C50919 Malignant neoplasm of unspecified site of unspecified female breast: Secondary | ICD-10-CM | POA: Diagnosis not present

## 2013-09-05 LAB — BASIC METABOLIC PANEL
BUN: 15 mg/dL (ref 6–23)
CHLORIDE: 103 meq/L (ref 96–112)
CO2: 23 meq/L (ref 19–32)
Calcium: 10 mg/dL (ref 8.4–10.5)
Creatinine, Ser: 0.77 mg/dL (ref 0.50–1.10)
GFR calc non Af Amer: 81 mL/min — ABNORMAL LOW (ref >90)
Glucose, Bld: 145 mg/dL — ABNORMAL HIGH (ref 70–99)
Potassium: 4.4 mEq/L (ref 3.7–5.3)
Sodium: 140 mEq/L (ref 137–147)

## 2013-09-05 LAB — CBC WITH DIFFERENTIAL/PLATELET
Basophils Absolute: 0.1 10*3/uL (ref 0.0–0.1)
Basophils Relative: 1 % (ref 0–1)
Eosinophils Absolute: 0.2 10*3/uL (ref 0.0–0.7)
Eosinophils Relative: 2 % (ref 0–5)
HEMATOCRIT: 43 % (ref 36.0–46.0)
Hemoglobin: 14.4 g/dL (ref 12.0–15.0)
Lymphocytes Relative: 24 % (ref 12–46)
Lymphs Abs: 1.8 10*3/uL (ref 0.7–4.0)
MCH: 31.6 pg (ref 26.0–34.0)
MCHC: 33.5 g/dL (ref 30.0–36.0)
MCV: 94.5 fL (ref 78.0–100.0)
MONO ABS: 1.1 10*3/uL — AB (ref 0.1–1.0)
Monocytes Relative: 14 % — ABNORMAL HIGH (ref 3–12)
Neutro Abs: 4.7 10*3/uL (ref 1.7–7.7)
Neutrophils Relative %: 59 % (ref 43–77)
PLATELETS: 232 10*3/uL (ref 150–400)
RBC: 4.55 MIL/uL (ref 3.87–5.11)
RDW: 14.2 % (ref 11.5–15.5)
WBC: 7.8 10*3/uL (ref 4.0–10.5)

## 2013-09-09 ENCOUNTER — Encounter (HOSPITAL_BASED_OUTPATIENT_CLINIC_OR_DEPARTMENT_OTHER): Payer: Medicare Other | Admitting: Anesthesiology

## 2013-09-09 ENCOUNTER — Encounter (HOSPITAL_BASED_OUTPATIENT_CLINIC_OR_DEPARTMENT_OTHER)
Admission: RE | Disposition: A | Discharge status: Home / Self Care | Payer: Self-pay | Source: Ambulatory Visit | Attending: General Surgery

## 2013-09-09 ENCOUNTER — Ambulatory Visit (HOSPITAL_BASED_OUTPATIENT_CLINIC_OR_DEPARTMENT_OTHER)
Admission: RE | Admit: 2013-09-09 | Discharge: 2013-09-10 | Disposition: A | Discharge status: Home / Self Care | Payer: Medicare Other | Source: Ambulatory Visit | Attending: General Surgery | Admitting: General Surgery

## 2013-09-09 ENCOUNTER — Encounter (HOSPITAL_BASED_OUTPATIENT_CLINIC_OR_DEPARTMENT_OTHER): Payer: Self-pay | Admitting: *Deleted

## 2013-09-09 ENCOUNTER — Ambulatory Visit (HOSPITAL_BASED_OUTPATIENT_CLINIC_OR_DEPARTMENT_OTHER): Payer: Medicare Other | Admitting: Anesthesiology

## 2013-09-09 ENCOUNTER — Ambulatory Visit: Admit: 2013-09-09 | Discharge: 2013-09-09 | Disposition: A | Discharge status: Home / Self Care | Payer: Medicare Other

## 2013-09-09 ENCOUNTER — Encounter (HOSPITAL_COMMUNITY): Admission: RE | Payer: Self-pay | Source: Ambulatory Visit

## 2013-09-09 ENCOUNTER — Ambulatory Visit (HOSPITAL_COMMUNITY): Admission: RE | Admit: 2013-09-09 | Payer: Medicare Other | Source: Ambulatory Visit | Admitting: General Surgery

## 2013-09-09 ENCOUNTER — Ambulatory Visit
Admit: 2013-09-09 | Discharge: 2013-09-09 | Disposition: A | Discharge status: Home / Self Care | Payer: Medicare Other | Attending: General Surgery | Admitting: General Surgery

## 2013-09-09 DIAGNOSIS — Z885 Allergy status to narcotic agent status: Secondary | ICD-10-CM | POA: Insufficient documentation

## 2013-09-09 DIAGNOSIS — Z79899 Other long term (current) drug therapy: Secondary | ICD-10-CM | POA: Insufficient documentation

## 2013-09-09 DIAGNOSIS — I10 Essential (primary) hypertension: Secondary | ICD-10-CM | POA: Diagnosis not present

## 2013-09-09 DIAGNOSIS — J302 Other seasonal allergic rhinitis: Secondary | ICD-10-CM

## 2013-09-09 DIAGNOSIS — F411 Generalized anxiety disorder: Secondary | ICD-10-CM | POA: Diagnosis not present

## 2013-09-09 DIAGNOSIS — R0602 Shortness of breath: Secondary | ICD-10-CM

## 2013-09-09 DIAGNOSIS — Z882 Allergy status to sulfonamides status: Secondary | ICD-10-CM | POA: Diagnosis not present

## 2013-09-09 DIAGNOSIS — C50411 Malignant neoplasm of upper-outer quadrant of right female breast: Secondary | ICD-10-CM | POA: Diagnosis present

## 2013-09-09 DIAGNOSIS — Z5181 Encounter for therapeutic drug level monitoring: Secondary | ICD-10-CM

## 2013-09-09 DIAGNOSIS — E039 Hypothyroidism, unspecified: Secondary | ICD-10-CM | POA: Diagnosis not present

## 2013-09-09 DIAGNOSIS — D0590 Unspecified type of carcinoma in situ of unspecified breast: Secondary | ICD-10-CM | POA: Insufficient documentation

## 2013-09-09 DIAGNOSIS — Z888 Allergy status to other drugs, medicaments and biological substances status: Secondary | ICD-10-CM | POA: Diagnosis not present

## 2013-09-09 DIAGNOSIS — C773 Secondary and unspecified malignant neoplasm of axilla and upper limb lymph nodes: Secondary | ICD-10-CM | POA: Insufficient documentation

## 2013-09-09 DIAGNOSIS — C50419 Malignant neoplasm of upper-outer quadrant of unspecified female breast: Secondary | ICD-10-CM | POA: Insufficient documentation

## 2013-09-09 DIAGNOSIS — Z880 Allergy status to penicillin: Secondary | ICD-10-CM | POA: Insufficient documentation

## 2013-09-09 DIAGNOSIS — N3281 Overactive bladder: Secondary | ICD-10-CM

## 2013-09-09 DIAGNOSIS — Z8701 Personal history of pneumonia (recurrent): Secondary | ICD-10-CM | POA: Diagnosis not present

## 2013-09-09 DIAGNOSIS — R0789 Other chest pain: Secondary | ICD-10-CM

## 2013-09-09 DIAGNOSIS — Z7901 Long term (current) use of anticoagulants: Secondary | ICD-10-CM | POA: Diagnosis not present

## 2013-09-09 DIAGNOSIS — Z86718 Personal history of other venous thrombosis and embolism: Secondary | ICD-10-CM | POA: Diagnosis not present

## 2013-09-09 DIAGNOSIS — Z170 Estrogen receptor positive status [ER+]: Secondary | ICD-10-CM | POA: Insufficient documentation

## 2013-09-09 DIAGNOSIS — Z800 Family history of malignant neoplasm of digestive organs: Secondary | ICD-10-CM | POA: Diagnosis not present

## 2013-09-09 DIAGNOSIS — Z884 Allergy status to anesthetic agent status: Secondary | ICD-10-CM | POA: Diagnosis not present

## 2013-09-09 DIAGNOSIS — F329 Major depressive disorder, single episode, unspecified: Secondary | ICD-10-CM

## 2013-09-09 DIAGNOSIS — F32A Depression, unspecified: Secondary | ICD-10-CM

## 2013-09-09 DIAGNOSIS — C50919 Malignant neoplasm of unspecified site of unspecified female breast: Secondary | ICD-10-CM | POA: Diagnosis not present

## 2013-09-09 HISTORY — PX: BREAST LUMPECTOMY WITH RADIOACTIVE SEED LOCALIZATION: SHX6424

## 2013-09-09 LAB — PROTIME-INR
INR: 1 (ref 0.00–1.49)
PROTHROMBIN TIME: 13 s (ref 11.6–15.2)

## 2013-09-09 LAB — APTT: APTT: 32 s (ref 24–37)

## 2013-09-09 SURGERY — BREAST LUMPECTOMY WITH RADIOACTIVE SEED LOCALIZATION
Anesthesia: General | Site: Breast | Laterality: Right

## 2013-09-09 SURGERY — BREAST LUMPECTOMY WITH RADIOACTIVE SEED LOCALIZATION
Anesthesia: General | Laterality: Right

## 2013-09-09 MED ORDER — VANCOMYCIN HCL 10 G IV SOLR
1500.0000 mg | INTRAVENOUS | Status: AC
  Administered 2013-09-09: 1500 mg via INTRAVENOUS

## 2013-09-09 MED ORDER — OXYCODONE HCL 5 MG PO TABS: 5.0000 mg | ORAL_TABLET | ORAL | Status: DC | PRN

## 2013-09-09 MED ORDER — OXYCODONE HCL 5 MG PO TABS: 5.0000 mg | ORAL_TABLET | Freq: Four times a day (QID) | ORAL | Status: DC | PRN

## 2013-09-09 MED ORDER — LISINOPRIL 10 MG PO TABS: 10.0000 mg | ORAL_TABLET | Freq: Every day | ORAL | Status: DC

## 2013-09-09 MED ORDER — ACETAMINOPHEN 325 MG PO TABS
650.0000 mg | ORAL_TABLET | Freq: Four times a day (QID) | ORAL | Status: DC | PRN
  Administered 2013-09-09 – 2013-09-10 (×3): 325 mg via ORAL
  Filled 2013-09-09 (×3): qty 2

## 2013-09-09 MED ORDER — DEXAMETHASONE SODIUM PHOSPHATE 4 MG/ML IJ SOLN
INTRAMUSCULAR | Status: DC | PRN
  Administered 2013-09-09: 10 mg via INTRAVENOUS

## 2013-09-09 MED ORDER — FENTANYL CITRATE 0.05 MG/ML IJ SOLN
INTRAMUSCULAR | Status: DC | PRN
  Administered 2013-09-09 (×2): 25 ug via INTRAVENOUS
  Administered 2013-09-09: 100 ug via INTRAVENOUS
  Administered 2013-09-09: 25 ug via INTRAVENOUS

## 2013-09-09 MED ORDER — EPHEDRINE SULFATE 50 MG/ML IJ SOLN
INTRAMUSCULAR | Status: DC | PRN
  Administered 2013-09-09 (×2): 10 mg via INTRAVENOUS

## 2013-09-09 MED ORDER — ALBUTEROL SULFATE HFA 108 (90 BASE) MCG/ACT IN AERS: 2.0000 | INHALATION_SPRAY | Freq: Four times a day (QID) | RESPIRATORY_TRACT | Status: DC | PRN

## 2013-09-09 MED ORDER — ONDANSETRON HCL 4 MG/2ML IJ SOLN
INTRAMUSCULAR | Status: DC | PRN
  Administered 2013-09-09: 4 mg via INTRAVENOUS

## 2013-09-09 MED ORDER — SODIUM CHLORIDE 0.9 % IV SOLN
INTRAVENOUS | Status: DC
  Administered 2013-09-09: 20:00:00 via INTRAVENOUS

## 2013-09-09 MED ORDER — FENTANYL CITRATE 0.05 MG/ML IJ SOLN: 50.0000 ug | INTRAMUSCULAR | Status: DC | PRN

## 2013-09-09 MED ORDER — ACETAMINOPHEN 650 MG RE SUPP: 650.0000 mg | Freq: Four times a day (QID) | RECTAL | Status: DC | PRN

## 2013-09-09 MED ORDER — ONDANSETRON HCL 4 MG/2ML IJ SOLN
4.0000 mg | Freq: Four times a day (QID) | INTRAMUSCULAR | Status: DC | PRN
  Administered 2013-09-09: 4 mg via INTRAVENOUS
  Filled 2013-09-09: qty 2

## 2013-09-09 MED ORDER — PROPOFOL 10 MG/ML IV BOLUS
INTRAVENOUS | Status: DC | PRN
  Administered 2013-09-09: 180 mg via INTRAVENOUS

## 2013-09-09 MED ORDER — MORPHINE SULFATE 2 MG/ML IJ SOLN
2.0000 mg | INTRAMUSCULAR | Status: DC | PRN
Start: 2013-09-09 — End: 2013-09-10

## 2013-09-09 MED ORDER — MIDAZOLAM HCL 2 MG/2ML IJ SOLN: 1.0000 mg | INTRAMUSCULAR | Status: DC | PRN

## 2013-09-09 MED ORDER — LACTATED RINGERS IV SOLN
INTRAVENOUS | Status: DC
  Administered 2013-09-09 (×2): via INTRAVENOUS

## 2013-09-09 MED ORDER — WARFARIN SODIUM 5 MG PO TABS: 5.0000 mg | ORAL_TABLET | Freq: Every day | ORAL | Status: DC

## 2013-09-09 MED ORDER — HYDROMORPHONE HCL PF 1 MG/ML IJ SOLN
0.2500 mg | INTRAMUSCULAR | Status: DC | PRN
  Administered 2013-09-09 (×2): 0.5 mg via INTRAVENOUS

## 2013-09-09 MED ORDER — LIDOCAINE HCL (CARDIAC) 20 MG/ML IV SOLN
INTRAVENOUS | Status: DC | PRN
  Administered 2013-09-09: 80 mg via INTRAVENOUS

## 2013-09-09 MED ORDER — PROMETHAZINE HCL 25 MG/ML IJ SOLN: 6.2500 mg | INTRAMUSCULAR | Status: DC | PRN

## 2013-09-09 MED ORDER — LEVOTHYROXINE SODIUM 100 MCG PO TABS: 100.0000 ug | ORAL_TABLET | Freq: Every day | ORAL | Status: DC

## 2013-09-09 SURGICAL SUPPLY — 65 items
ADH SKN CLS APL DERMABOND .7 (GAUZE/BANDAGES/DRESSINGS) ×1
APL SKNCLS STERI-STRIP NONHPOA (GAUZE/BANDAGES/DRESSINGS) ×1
APPLIER CLIP 9.375 MED OPEN (MISCELLANEOUS) ×2
APR CLP MED 9.3 20 MLT OPN (MISCELLANEOUS) ×1
BENZOIN TINCTURE PRP APPL 2/3 (GAUZE/BANDAGES/DRESSINGS) ×2 IMPLANT
BINDER BREAST LRG (GAUZE/BANDAGES/DRESSINGS) IMPLANT
BINDER BREAST MEDIUM (GAUZE/BANDAGES/DRESSINGS) IMPLANT
BINDER BREAST XLRG (GAUZE/BANDAGES/DRESSINGS) IMPLANT
BINDER BREAST XXLRG (GAUZE/BANDAGES/DRESSINGS) ×1 IMPLANT
BIOPATCH RED 1 DISK 7.0 (GAUZE/BANDAGES/DRESSINGS) ×1 IMPLANT
BLADE SURG 15 STRL LF DISP TIS (BLADE) ×1 IMPLANT
BLADE SURG 15 STRL SS (BLADE) ×2
CANISTER SUC SOCK COL 7IN (MISCELLANEOUS) IMPLANT
CANISTER SUCT 1200ML W/VALVE (MISCELLANEOUS) IMPLANT
CHLORAPREP W/TINT 26ML (MISCELLANEOUS) ×2 IMPLANT
CLIP APPLIE 9.375 MED OPEN (MISCELLANEOUS) IMPLANT
COVER MAYO STAND STRL (DRAPES) ×2 IMPLANT
COVER PROBE W GEL 5X96 (DRAPES) ×2 IMPLANT
COVER TABLE BACK 60X90 (DRAPES) ×2 IMPLANT
DECANTER SPIKE VIAL GLASS SM (MISCELLANEOUS) ×1 IMPLANT
DERMABOND ADVANCED (GAUZE/BANDAGES/DRESSINGS) ×1
DERMABOND ADVANCED .7 DNX12 (GAUZE/BANDAGES/DRESSINGS) ×1 IMPLANT
DEVICE DUBIN W/COMP PLATE 8390 (MISCELLANEOUS) ×2 IMPLANT
DRAPE PED LAPAROTOMY (DRAPES) ×2 IMPLANT
DRSG TEGADERM 4X4.75 (GAUZE/BANDAGES/DRESSINGS) ×2 IMPLANT
ELECT COATED BLADE 2.86 ST (ELECTRODE) ×2 IMPLANT
ELECT REM PT RETURN 9FT ADLT (ELECTROSURGICAL) ×2
ELECTRODE REM PT RTRN 9FT ADLT (ELECTROSURGICAL) ×1 IMPLANT
GLOVE BIO SURGEON STRL SZ7 (GLOVE) ×5 IMPLANT
GLOVE BIO SURGEON STRL SZ7.5 (GLOVE) ×1 IMPLANT
GLOVE BIOGEL PI IND STRL 6.5 (GLOVE) IMPLANT
GLOVE BIOGEL PI IND STRL 7.5 (GLOVE) ×1 IMPLANT
GLOVE BIOGEL PI IND STRL 8 (GLOVE) IMPLANT
GLOVE BIOGEL PI INDICATOR 6.5 (GLOVE) ×1
GLOVE BIOGEL PI INDICATOR 7.5 (GLOVE) ×2
GLOVE BIOGEL PI INDICATOR 8 (GLOVE) ×1
GOWN STRL REUS W/ TWL LRG LVL3 (GOWN DISPOSABLE) ×2 IMPLANT
GOWN STRL REUS W/ TWL XL LVL3 (GOWN DISPOSABLE) IMPLANT
GOWN STRL REUS W/TWL LRG LVL3 (GOWN DISPOSABLE) ×4
GOWN STRL REUS W/TWL XL LVL3 (GOWN DISPOSABLE) ×2
KIT MARKER MARGIN INK (KITS) ×2 IMPLANT
NDL HYPO 25X1 1.5 SAFETY (NEEDLE) ×1 IMPLANT
NEEDLE HYPO 25X1 1.5 SAFETY (NEEDLE) ×2 IMPLANT
NS IRRIG 1000ML POUR BTL (IV SOLUTION) ×1 IMPLANT
PACK BASIN DAY SURGERY FS (CUSTOM PROCEDURE TRAY) ×2 IMPLANT
PENCIL BUTTON HOLSTER BLD 10FT (ELECTRODE) ×2 IMPLANT
SLEEVE SCD COMPRESS KNEE MED (MISCELLANEOUS) ×2 IMPLANT
SPONGE GAUZE 4X4 12PLY STER LF (GAUZE/BANDAGES/DRESSINGS) ×2 IMPLANT
SPONGE LAP 4X18 X RAY DECT (DISPOSABLE) ×2 IMPLANT
STAPLER VISISTAT 35W (STAPLE) ×1 IMPLANT
STRIP CLOSURE SKIN 1/2X4 (GAUZE/BANDAGES/DRESSINGS) ×2 IMPLANT
SUT ETHILON 2 0 FS 18 (SUTURE) ×1 IMPLANT
SUT MNCRL AB 4-0 PS2 18 (SUTURE) ×2 IMPLANT
SUT MON AB 5-0 PS2 18 (SUTURE) IMPLANT
SUT SILK 2 0 SH (SUTURE) IMPLANT
SUT VIC AB 2-0 SH 27 (SUTURE) ×2
SUT VIC AB 2-0 SH 27XBRD (SUTURE) ×1 IMPLANT
SUT VIC AB 3-0 SH 27 (SUTURE) ×2
SUT VIC AB 3-0 SH 27X BRD (SUTURE) ×1 IMPLANT
SUT VIC AB 5-0 PS2 18 (SUTURE) IMPLANT
SYR CONTROL 10ML LL (SYRINGE) ×2 IMPLANT
TOWEL OR 17X24 6PK STRL BLUE (TOWEL DISPOSABLE) ×2 IMPLANT
TOWEL OR NON WOVEN STRL DISP B (DISPOSABLE) ×2 IMPLANT
TUBE CONNECTING 20X1/4 (TUBING) ×1 IMPLANT
YANKAUER SUCT BULB TIP NO VENT (SUCTIONS) ×1 IMPLANT

## 2013-09-09 NOTE — Transfer of Care (Signed)
Immediate Anesthesia Transfer of Care Note  Patient: Alicia Vasquez  Procedure(s) Performed: Procedure(s): BREAST LUMPECTOMY WITH RADIOACTIVE SEED LOCALIZATION WITH AXILLARY NODE EXCISION (Right)  Patient Location: PACU  Anesthesia Type:General  Level of Consciousness: awake, sedated, patient cooperative and confused  Airway & Oxygen Therapy: Patient Spontanous Breathing and Patient connected to face mask oxygen  Post-op Assessment: Report given to PACU RN and Post -op Vital signs reviewed and stable  Post vital signs: Reviewed and stable  Complications: No apparent anesthesia complications

## 2013-09-09 NOTE — Anesthesia Preprocedure Evaluation (Addendum)
Anesthesia Evaluation  Patient identified by MRN, date of birth, ID band Patient awake    Reviewed: Allergy & Precautions, H&P , NPO status , Patient's Chart, lab work & pertinent test results  History of Anesthesia Complications Negative for: history of anesthetic complications  Airway Mallampati: III  Neck ROM: Limited    Dental  (+) Teeth Intact   Pulmonary shortness of breath,  breath sounds clear to auscultation        Cardiovascular hypertension, Rhythm:Regular Rate:Normal     Neuro/Psych Anxiety Depression    GI/Hepatic   Endo/Other  Hypothyroidism Morbid obesity  Renal/GU      Musculoskeletal   Abdominal (+) + obese,   Peds  Hematology   Anesthesia Other Findings   Reproductive/Obstetrics                          Anesthesia Physical Anesthesia Plan  ASA: III  Anesthesia Plan: General   Post-op Pain Management:    Induction: Intravenous  Airway Management Planned: LMA  Additional Equipment:   Intra-op Plan:   Post-operative Plan: Extubation in OR  Informed Consent: I have reviewed the patients History and Physical, chart, labs and discussed the procedure including the risks, benefits and alternatives for the proposed anesthesia with the patient or authorized representative who has indicated his/her understanding and acceptance.     Plan Discussed with: CRNA and Surgeon  Anesthesia Plan Comments:         Anesthesia Quick Evaluation

## 2013-09-09 NOTE — Discharge Instructions (Signed)
[facility] °Office Phone Number [phone] ° °BREAST BIOPSY/ PARTIAL MASTECTOMY: POST OP INSTRUCTIONS ° °Always review your discharge instruction sheet given to you by the facility where your surgery was performed. ° °IF YOU HAVE DISABILITY OR FAMILY LEAVE FORMS, YOU MUST BRING THEM TO THE OFFICE FOR PROCESSING.  DO NOT GIVE THEM TO YOUR DOCTOR. ° °1. A prescription for pain medication may be given to you upon discharge.  Take your pain medication as prescribed, if needed.  If narcotic pain medicine is not needed, then you may take acetaminophen (Tylenol), naprosyn (Alleve) or ibuprofen (Advil) as needed. °2. Take your usually prescribed medications unless otherwise directed °3. If you need a refill on your pain medication, please contact your pharmacy.  They will contact our office to request authorization.  Prescriptions will not be filled after 5pm or on week-ends. °4. You should eat very light the first 24 hours after surgery, such as soup, crackers, pudding, etc.  Resume your normal diet the day after surgery. °5. Most patients will experience some swelling and bruising in the breast.  Ice packs and a good support bra will help.  Wear the breast binder provided or a sports bra for 72 hours day and night.  After that wear a sports bra during the day until you return to the office. Swelling and bruising can take several days to resolve.  °6. It is common to experience some constipation if taking pain medication after surgery.  Increasing fluid intake and taking a stool softener will usually help or prevent this problem from occurring.  A mild laxative (Milk of Magnesia or Miralax) should be taken according to package directions if there are no bowel movements after 48 hours. °7. Unless discharge instructions indicate otherwise, you may remove your bandages 48 hours after surgery and you may shower at that time.  You may have steri-strips (small skin tapes) in place directly over the incision.   These strips should be left on the skin for 7-10 days and will come off on their own.  If your surgeon used skin glue on the incision, you may shower in 24 hours.  The glue will flake off over the next 2-3 weeks.  Any sutures or staples will be removed at the office during your follow-up visit. °8. ACTIVITIES:  You may resume regular daily activities (gradually increasing) beginning the next day.  Wearing a good support bra or sports bra minimizes pain and swelling.  You may have sexual intercourse when it is comfortable. °a. You may drive when you no longer are taking prescription pain medication, you can comfortably wear a seatbelt, and you can safely maneuver your car and apply brakes. °b. RETURN TO WORK:  ______________________________________________________________________________________ °9. You should see your doctor in the office for a follow-up appointment approximately two weeks after your surgery.  Your doctor’s nurse will typically make your follow-up appointment when she calls you with your pathology report.  Expect your pathology report 3-4 business days after your surgery.  You may call to check if you do not hear from us after three days. °10. OTHER INSTRUCTIONS: _______________________________________________________________________________________________ _____________________________________________________________________________________________________________________________________ °_____________________________________________________________________________________________________________________________________ °_____________________________________________________________________________________________________________________________________ ° °WHEN TO CALL DR WAKEFIELD: °1. Fever over 101.0 °2. Nausea and/or vomiting. °3. Extreme swelling or bruising. °4. Continued bleeding from incision. °5. Increased pain, redness, or drainage from the incision. ° °The clinic staff is available to  answer your questions during regular business hours.  Please don’t hesitate to call and ask to speak to one of the nurses for   clinical concerns.  If you have a medical emergency, go to the nearest emergency room or call 911.  A surgeon from [facility] is always on call at the hospital. ° °For further questions, please visit [email or web address] ° °Post Anesthesia Home Care Instructions ° °Activity: °Get plenty of rest for the remainder of the day. A responsible adult should stay with you for 24 hours following the procedure.  °For the next 24 hours, DO NOT: °-Drive a car °-Operate machinery °-Drink alcoholic beverages °-Take any medication unless instructed by your physician °-Make any legal decisions or sign important papers. ° °Meals: °Start with liquid foods such as gelatin or soup. Progress to regular foods as tolerated. Avoid greasy, spicy, heavy foods. If nausea and/or vomiting occur, drink only clear liquids until the nausea and/or vomiting subsides. Call your physician if vomiting continues. ° °Special Instructions/Symptoms: °Your throat may feel dry or sore from the anesthesia or the breathing tube placed in your throat during surgery. If this causes discomfort, gargle with warm salt water. The discomfort should disappear within 24 hours. ° °

## 2013-09-09 NOTE — H&P (Signed)
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 She has since undergone an ultrasound that shows a 1.3x1.2x1.5 cm right breast mass with a 1.6 cm right axillary node. 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   Past Medical History   Diagnosis  Date   .  Allergy    .  Hypertension    .  Hypothyroid    .  Blood transfusion without reported diagnosis    .  Pneumonia    .  DVT (deep vein thrombosis) in pregnancy    .  Breast cancer  07/12/13     invasive mammary carcinoma   .  Anxiety    .  Arthritis     Past Surgical History   Procedure  Laterality  Date   .  Removal of teeth   04/19/2012     13 teeth removed   .  Biospy       female organs    Family History   Problem  Relation  Age of Onset   .  Cancer  Brother      prostate   .  Heart disease  Brother    .  Colon cancer  Brother    Social History  History   Substance Use Topics   .  Smoking status:  Never Smoker   .  Smokeless tobacco:  Not on file   .  Alcohol Use:  No    Allergies   Allergen  Reactions   .  Anesthetics, Amide  Other (See Comments)     ELEVATE BLOOD PRESSURE   .  Benadryl [Diphenhydramine Hcl]      dizziness   .  Carbocaine [Mepivacaine Hcl]  Other (See Comments)     Elevated blood pressure   .  Codeine  Nausea Only and Other (See Comments)     dizziness   .  Epinephrine  Other (See Comments)     Elevated blood pressure   .  Sulfa Antibiotics  Other (See Comments)     dizziness   .  Vicodin [Hydrocodone-Acetaminophen]  Nausea Only   .  Penicillins  Rash    Current Outpatient Prescriptions   Medication  Sig  Dispense  Refill   .  albuterol (PROVENTIL HFA;VENTOLIN HFA) 108 (90 BASE) MCG/ACT inhaler  Inhale 2 puffs into the lungs every 6 (six) hours as  needed.  1 Inhaler  0   .  lisinopril (PRINIVIL,ZESTRIL) 10 MG tablet  Take 1 tablet (10 mg total) by mouth daily.  90 tablet  3   .  SYNTHROID 100 MCG tablet  Take 1 tablet (100 mcg total) by mouth daily.  90 tablet  3   .  warfarin (COUMADIN) 5 MG tablet  Take as directed by anticoagulation clinic  60 tablet  3    Current Facility-Administered Medications   Medication  Dose  Route  Frequency  Provider  Last Rate  Last Dose   .  ipratropium (ATROVENT) nebulizer solution 0.5 mg  0.5 mg  Nebulization  Once  Hayden Rasmussen, MD     Review of Systems  Review of Systems  Constitutional: Negative for fever, chills and unexpected weight change.  Respiratory: Positive for cough  and shortness of breath. Negative for wheezing.  Cardiovascular: Positive for chest pain and palpitations. Negative for leg swelling.  Gastrointestinal: Negative for nausea, vomiting, abdominal pain, diarrhea, constipation, blood in stool, abdominal distention and anal bleeding.   Physical Exam  Physical Exam  Vitals reviewed.  Constitutional: She appears well-developed and well-nourished.  Neck: Neck supple.  Cardiovascular: Normal rate, regular rhythm and normal heart sounds.  Pulmonary/Chest: Effort normal and breath sounds normal. She has no wheezes. She has no rales. Right breast exhibits no inverted nipple, no mass, no nipple discharge, no skin change and no tenderness. Left breast exhibits no inverted nipple, no mass, no nipple discharge, no skin change and no tenderness.  Abdominal: Soft. There is no tenderness.  Lymphadenopathy:  She has no cervical adenopathy.  She has axillary adenopathy.  Right axillary: Lateral adenopathy present.  Left axillary: No lateral adenopathy present. Right: No supraclavicular adenopathy present.  Left: No supraclavicular adenopathy present.   Data Reviewed  ULTRASOUND BILATERAL BREAST  DIGITAL BREAST TOMOSYNTHESIS Digital breast tomosynthesis images are  acquired in two  projections. These images are reviewed in  combination with the digital mammogram, confirming the findings  below.  COMPARISON:  Prior examinations dating back to 10/02/2009.  ACR BREAST DENSITY:  ACR Breast Density Category c: The breast tissue is heterogeneously  dense, which may obscure small masses.  FINDINGS:  There is a 1.5 cm spiculated mass within the upper-outer quadrant of  the right breast posterior depth. Additionally there is an enlarged  right axillary lymph node.  Within the left breast 11 o'clock position anterior depth there is a  1.5 cm mass underlying the palpable marker. Additionally within the  1 o'clock position there is a 1.1 cm circumscribed mass within the  middle depth.  Mammographic images were processed with CAD.  On physical exam, I palpate no discrete mass within either breast.  Ultrasound is performed, showing a 1.3 x 1.2 x 1.5 cm irregular  hypoechoic mass with internal vascularity within the right breast 11  o'clock position 8 cm from the nipple. Additionally there is a bulky  enlarged right axillary lymph node measuring up to 1.6 cm.  Within the left breast 11 o'clock position 3 cm from the nipple  there is a 1.1 x 0.9 x 1.4 cm oval hypoechoic circumscribed mass  with mixed internal echogenicity. No internal vascularity is  identified. Additionally within the left breast 1 o'clock position 4  cm from the nipple there is a 0.9 x 0.6 x 0.9 cm simple cyst. No  left axillary lymphadenopathy.  IMPRESSION:  1. Suspicious right breast mass with enlarged right axillary lymph  node concerning for primary breast malignancy and metastatic  adenopathy. Ultrasound-guided core needle biopsy of both the  suspicious right breast mass and enlarged right axillary lymph node  is recommended. Potentially markedly complicated cyst versus solid  mass within the left breast 11 o'clock position at the site of  patient's reported palpable abnormality. Cyst aspiration  versus  ultrasound-guided core needle biopsy of this mass is recommended.  RECOMMENDATION:  1. Ultrasound-guided core needle biopsy suspicious right breast mass  and enlarged right axillary lymph node. Diagnostic cyst aspiration  versus core needle biopsy of complicated cyst versus polyp mass  within the anterior aspect of the left breast 11 o'clock position.  Procedures scheduled for 07/11/2013 at 1 p.m.  I have discussed the findings and recommendations with the patient.  Results were also provided in writing at the conclusion of the  visit.  Assessment  Clinical stage II right breast cancer  Plan  Right breast radioactive seed guided lumpectomy and targeted right axillary node excision  We discussed the staging and pathophysiology of breast cancer. We discussed all of the different options for treatment for breast cancer including surgery, chemotherapy, radiation therapy, Herceptin, and antiestrogen therapy.  We discussed an axillary node dissection vs an excision of involved node. I don't think full alnd is needed and will give her any benefit more than excision of the involved node followed by axillary radiotherapy. Her risk of LE is significant and I cannot promise her that her survival will be any better.  We discussed the options for treatment of the breast cancer which included lumpectomy versus a mastectomy. We discussed the performance of the lumpectomy with a wire placement. We discussed up to a 5% chance of a positive margin requiring reexcision in the operating room. We also discussed that she may need radiation therapy or antiestrogen therapy or both if she undergoes lumpectomy. We discussed the mastectomy and the postoperative care for that as well. We discussed that there is no difference in her survival whether she undergoes lumpectomy with radiation therapy or antiestrogen therapy versus a mastectomy.  We discussed the risks of operation including bleeding, infection, possible  reoperation. She understands her further therapy will be based on what her stages at the time of her operation.

## 2013-09-09 NOTE — Op Note (Signed)
Preoperative diagnosis: Clinical stage II right breast cancer Postoperative diagnosis: Same as above Procedure: #1 right breast radioactive seed guided lumpectomy #2 right axillary node radioactive seed guided excision and low axillary lymph node dissection Surgeon: Dr. Serita Grammes Anesthesia: Gen. Estimated blood loss: 30 cc Specimens: #1 right breast tissue marked with paint, seed in place #2 right axillary lymph node with seed in place #3 additional right axillary contents Complications: None Drains: 19 French Blake drain to axilla Sponge count correct at completion Disposition to recovery stable  Indications: This is a 73 year old female who was diagnosed with a clinical stage II right breast cancer with a 1.5 cm right upper outer quadrant mass as well as a node that was positive. These were both invasive ductal carcinoma. She was seen in our multidisciplinary conference. We discussed all of her options and decided to proceed with a lumpectomy combined with a local axillary node excision including the biopsied positive node. We discussed at length prior to beginning. She had the seeds placed prior to beginning.  Procedure: After informed consent was obtained the patient was taken to the operating room. I had mammograms with the seeds in place available. She was given vancomycin due to her allergies. She had sequential compression devices on her legs. She had stopped her Coumadin previously. She was in place under general anesthesia without complication. I confirmed that both seeds were present. She was then prepped and draped in the standard sterile surgical fashion. Surgical time out was then performed.  I made an upper outer quadrant incision overlying the seed in the breast. I then used the neoprobe in an attempt to get a clear margin around the tumor. I then marked this with paint. Mammogram confirmed I had removed the tumor, clip, and radioactive seed. This was confirmed by  radiology. This was then sent to pathology. I then was able to enter into her axilla from the same incision. I  quickly identified the enlarged node with the seed in place. I confirmed removal of the seed it using the Faxitron machine as well. The seed was also sent to pathology. I did feel some more additional palpable nodes so I ended up removing the lower portion of the axillary nodes. There were no other palpable nodes. I did visualize the axillary vein as well as the muscle and removed the tissue overlying this. I did place a 79 [place] Blake drain. Again there were no more gross disease present in her axilla. Hemostasis is obtained. I closed the axilla with a 2-0 Vicryl suture. I then clipped my cavity in the breast. I then closes with 2-0 Vicryl, 3-0 Vicryl, and 4-0 Monocryl. Dermabond and Steri-Strips were placed. The drain was secured with a 2-0 nylon. A breast binder was placed. She tolerated this well was extubated and transferred to recovery stable.

## 2013-09-09 NOTE — Anesthesia Postprocedure Evaluation (Signed)
  Anesthesia Post-op Note  Patient: Alicia Vasquez  Procedure(s) Performed: Procedure(s): BREAST LUMPECTOMY WITH RADIOACTIVE SEED LOCALIZATION WITH AXILLARY NODE EXCISION (Right)  Patient Location: PACU  Anesthesia Type:General  Level of Consciousness: awake and alert   Airway and Oxygen Therapy: Patient Spontanous Breathing  Post-op Pain: mild  Post-op Assessment: Post-op Vital signs reviewed, Patient's Cardiovascular Status Stable and Respiratory Function Stable  Post-op Vital Signs: Reviewed  Filed Vitals:   09/09/13 1400  BP: 138/64  Pulse: 81  Temp:   Resp: 9    Complications: No apparent anesthesia complications

## 2013-09-09 NOTE — Anesthesia Procedure Notes (Signed)
Procedure Name: LMA Insertion Date/Time: 09/09/2013 11:52 AM Performed by: Lyndee Leo Pre-anesthesia Checklist: Patient identified, Emergency Drugs available, Suction available and Patient being monitored Patient Re-evaluated:Patient Re-evaluated prior to inductionOxygen Delivery Method: Circle System Utilized Preoxygenation: Pre-oxygenation with 100% oxygen Intubation Type: IV induction Ventilation: Mask ventilation without difficulty LMA: LMA inserted LMA Size: 3.0 Number of attempts: 1 Airway Equipment and Method: bite block Placement Confirmation: positive ETCO2 Tube secured with: Tape Dental Injury: Teeth and Oropharynx as per pre-operative assessment

## 2013-09-10 ENCOUNTER — Telehealth (INDEPENDENT_AMBULATORY_CARE_PROVIDER_SITE_OTHER): Payer: Self-pay

## 2013-09-10 NOTE — Telephone Encounter (Signed)
LMOM for pt to call me. I want to check on her after surgery Monday with Dr Donne Hazel. I want to give her a f/u appt with Dr Donne Hazel for when he comes back on 09/26/13 arrive at 9:50. I want her to know that she needs to go get her PT/INR checked this Friday per Dr Donne Hazel at the coumadin clinic she goes to since she started the Coumadin back after surgery. I want to discuss with her about her axillary drain and when it needs to come out per Dr Donne Hazel.

## 2013-09-10 NOTE — Progress Notes (Signed)
1 Day Post-Op  Subjective: Doing well   Objective: Vital signs in last 24 hours: Temp:  [96.1 F (35.6 C)-97.8 F (36.6 C)] 97.2 F (36.2 C) (04/14 0615) Pulse Rate:  [68-99] 68 (04/14 0615) Resp:  [9-28] 18 (04/14 0615) BP: (132-178)/(64-94) 135/70 mmHg (04/14 0615) SpO2:  [90 %-98 %] 98 % (04/14 0615) Weight:  [233 lb (105.688 kg)] 233 lb (105.688 kg) (04/13 1016)    Intake/Output from previous day: 04/13 0701 - 04/14 0700 In: 2205 [P.O.:780; I.V.:1425] Out: 2295 [Urine:2250; Drains:45] Intake/Output this shift:    Incision/Wound:right breat C/D/I JP serous  Lab Results:  No results found for this basename: WBC, HGB, HCT, PLT,  in the last 72 hours BMET No results found for this basename: NA, K, CL, CO2, GLUCOSE, BUN, CREATININE, CALCIUM,  in the last 72 hours PT/INR  Recent Labs  09/09/13 1050  LABPROT 13.0  INR 1.00   ABG No results found for this basename: PHART, PCO2, PO2, HCO3,  in the last 72 hours  Studies/Results: Mm Breast Surgical Specimen  09/09/2013   CLINICAL DATA:  Known right breast cancer for surgery.  EXAM: SPECIMEN RADIOGRAPH OF THE RIGHT BREAST  COMPARISON:  Previous exam(s)  FINDINGS: Status post excision of the right breast lymph node. Radioactive seed is present and is marked for pathology.  IMPRESSION: Specimen radiograph of the right breast.  No apparent complications   Electronically Signed   By: Abelardo Diesel M.D.   On: 09/09/2013 13:37   Mm Breast Surgical Specimen  09/09/2013   CLINICAL DATA:  Personal history of right breast cancer status post surgical excision.  EXAM: SPECIMEN RADIOGRAPH OF THE RIGHT BREAST  COMPARISON:  Previous exam(s)  FINDINGS: Status post excision of the right breast. Radioactive seed, biopsy clip, and massare present and is marked for pathology.  IMPRESSION: Specimen radiograph of the right breast.  No apparent complications   Electronically Signed   By: Abelardo Diesel M.D.   On: 09/09/2013 13:36     Anti-infectives: Anti-infectives   Start     Dose/Rate Route Frequency Ordered Stop   09/09/13 1026  vancomycin (VANCOCIN) 1,500 mg in sodium chloride 0.9 % 500 mL IVPB     1,500 mg 250 mL/hr over 120 Minutes Intravenous On call to O.R. 09/09/13 1026 09/09/13 1140      Assessment/Plan: s/p Procedure(s): BREAST LUMPECTOMY WITH RADIOACTIVE SEED LOCALIZATION WITH AXILLARY NODE EXCISION (Right) Discharge  LOS: 1 day    Alicia Vasquez A. Alicia Vasquez 09/10/2013

## 2013-09-10 NOTE — Discharge Summary (Signed)
Physician Discharge Summary  Patient ID: Alicia Vasquez MRN: [id number] DOB/AGE: 74-Mar-1942 74 y.o.  Admit date: 09/09/2013 Discharge date: 09/10/2013  Admission Diagnoses:right breast cancer  Discharge Diagnoses: same Active Problems:   Breast cancer of upper-outer quadrant of right female breast   Discharged Condition: good  Hospital Course: unremarkable see note  Consults: None  Significant Diagnostic Studies: none  Treatments: surgery: right breat lumpectomy   Discharge Exam: Blood pressure 135/70, pulse 68, temperature 97.2 F (36.2 C), temperature source Oral, resp. rate 18, height 5\' 4"  (1.626 m), weight 233 lb (105.688 kg), SpO2 98.00%. Incision/Wound:right breast incisions  C/D/I.  JP serous   Disposition: Final discharge disposition not confirmed  Discharge Orders   Future Appointments Provider Department Dept Phone   10/08/2013 12:00 PM Deatra Robinson, MD [facility] [phone]   10/10/2013 10:30 AM [name], MD [facility] [phone]   Future Orders Complete By Expires   Diet - low sodium heart healthy  As directed    Increase activity slowly  As directed        Medication List         acetaminophen 500 MG tablet  Commonly known as:  TYLENOL  Take 500 mg by mouth every 6 (six) hours as needed for mild pain, fever or headache.     albuterol 108 (90 BASE) MCG/ACT inhaler  Commonly known as:  PROVENTIL HFA;VENTOLIN HFA  Inhale 2 puffs into the lungs every 6 (six) hours as needed for wheezing.     ipratropium 0.03 % nasal spray  Commonly known as:  ATROVENT  Place 2 sprays into both nostrils daily as needed for rhinitis.     lisinopril 10 MG tablet  Commonly known as:  PRINIVIL,ZESTRIL  Take 10 mg by mouth daily.     oxyCODONE 5 MG immediate release tablet  Commonly known as:  Oxy IR/ROXICODONE  Take 1-2 tablets (5-10 mg total) by mouth every 6 (six) hours as needed.     SYNTHROID 100 MCG  tablet  Generic drug:  levothyroxine  Take 100 mcg by mouth daily.     warfarin 5 MG tablet  Commonly known as:  COUMADIN  Take 5-7.5 mg by mouth daily. Take 10 mg on Monday, Wednesday and Friday and 7.5 mg all other days           Follow-up Information   Follow up with [facility], MD In 2 weeks.   Specialty:  General Surgery   Contact information:   [address] [place] [address] [phone]       Signed: Joyice Faster. Marlia Schewe 09/10/2013, 7:29 AM

## 2013-09-12 ENCOUNTER — Telehealth (INDEPENDENT_AMBULATORY_CARE_PROVIDER_SITE_OTHER): Payer: Self-pay | Admitting: General Surgery

## 2013-09-12 NOTE — Telephone Encounter (Signed)
Called Malachy Mood to see if she could help me with scheduling the pt for a appt with the [facility] for Friday 09/13/13. Malachy Mood will call the coumadin clinic and call me back.

## 2013-09-12 NOTE — Telephone Encounter (Signed)
Called pt's friend to see if she could get a hold of the pt for me since I have not been able to reach her since surgery. [name] said the pt could be reached there at her home but she was in the restroom. The pt's cell phone has been acting up that is why the pt has not called me back. The pt will return my call.

## 2013-09-12 NOTE — Telephone Encounter (Signed)
Malachy Mood called me to let me know that the pt gets her Coumadin checked with Jenny Reichmann at [facility] for me to call them to set up the appt. She gave me the [phone]. I will call to schedule for the pt.

## 2013-09-12 NOTE — Telephone Encounter (Signed)
LMOM for Alicia Vasquez asking for her to call the pt today to make an appt with the [facility] Clinic for Friday 4/17 to get her PT/INR checked since she had to stop the Coumadin for 5 days before surgery and now she restarted the coumadin this week. The pt had surgery this past Monday 09/09/13 and we just want to make sure everything is back on track with her restarting the Coumadin. I will notify the pt to be expecting a call today but if the pt doesn't hear from [facility] she will be instructed to call. I advised that I was leaving today and would not return till Monday.

## 2013-09-12 NOTE — Telephone Encounter (Signed)
Left message  To call Mayra Neer re: new cell # for Dr. Donne Hazel to return call to/eh [phone]

## 2013-09-12 NOTE — Telephone Encounter (Signed)
Called Raisa back advised her that I had to leave a message with Jenny Reichmann at her coumadin clinic for Jenny Reichmann to call her at [facility] to be able to reach her today to make an appt for tomorrow. I advised pt that if she doesn't hear from [facility] in the next little bit for the pt to call her again. I gave the phone # to the pt. Cloee understands and is aware that I am leaving now to return back on Monday. I advised her that I will let Dr Donne Hazel know how to get a hold of her is thru calling her friend [name]. The pt understands.

## 2013-09-12 NOTE — Telephone Encounter (Signed)
LMOM again explaining to pt very important that she return my call b/c I'm only here today a 1/2 day and off tomorrow. I need to make sure the pt goes to the [facility]in Clinic Friday 4/17 to have her PT/INR check since she restarted the Coumadin this week after sx that was done Monday 09/09/13. Dr Donne Hazel was going to try and call pt later today with path report so he will give her the message to call me if he can.

## 2013-09-13 ENCOUNTER — Other Ambulatory Visit (INDEPENDENT_AMBULATORY_CARE_PROVIDER_SITE_OTHER): Payer: Self-pay | Admitting: General Surgery

## 2013-09-16 ENCOUNTER — Encounter (HOSPITAL_BASED_OUTPATIENT_CLINIC_OR_DEPARTMENT_OTHER): Payer: Self-pay | Admitting: General Surgery

## 2013-09-16 NOTE — Telephone Encounter (Signed)
Called pt to talk to her since she has been scheduled for the re-excision next week by Dr Donne Hazel on 09/24/13. The pt was instructed with the details for the surgery next week by [facility]. I advised pt that she will need to stop her Coumadin for 5 days before surgery again just like the last surgery. The pt understands. The pt gave me info on the drainage from the drain for 24hrs on Sat. 48ml, Sunday 16ml, and just this am it's been 74ml. I advised pt that I would have to check with Dr Donne Hazel but the drain is still putting out too much for the drain to come out now. The pt wants to know if she can take a shower with the drain in place? Also, the pt has a f/u appt with Dr Donne Hazel scheduled on 09/26/13 but really the appt needs to be pushed back a week since she is having a re-excision now on 09/24/13. I will check with Dr Donne Hazel and get back in touch with the pt.

## 2013-09-16 NOTE — Telephone Encounter (Signed)
Returned her call. I did speak with Dr Donne Hazel about taking a shower and he said yes she could take shower with drain in place. Dr Donne Hazel said the drain is putting out too much to take out now so probably will stay in till surgery next week. I did r/s the appt from 09/26/13 to 10/01/13. The pt understands.

## 2013-09-17 ENCOUNTER — Ambulatory Visit (INDEPENDENT_AMBULATORY_CARE_PROVIDER_SITE_OTHER): Payer: Medicare Other | Admitting: General Practice

## 2013-09-17 ENCOUNTER — Encounter (HOSPITAL_BASED_OUTPATIENT_CLINIC_OR_DEPARTMENT_OTHER): Payer: Self-pay | Admitting: *Deleted

## 2013-09-17 DIAGNOSIS — Z5181 Encounter for therapeutic drug level monitoring: Secondary | ICD-10-CM | POA: Diagnosis not present

## 2013-09-17 LAB — POCT INR: INR: 1.7

## 2013-09-17 NOTE — Patient Instructions (Addendum)
4/23 - Take last dose of coumadin until after surgery  4/29 - Take 10 mg for 2 days and then return to previous dosage.

## 2013-09-17 NOTE — Progress Notes (Signed)
Pre visit review using our clinic review tool, if applicable. No additional management support is needed unless otherwise documented below in the visit note. 

## 2013-09-17 NOTE — Progress Notes (Signed)
Pt was here for surgery 2 weeks ago-stayed rcc-will go off coumadin again and come in 4/27 for pt and cbc Bring all meds and medicines dos

## 2013-09-19 ENCOUNTER — Ambulatory Visit: Payer: Medicare Other | Admitting: Oncology

## 2013-09-23 ENCOUNTER — Other Ambulatory Visit: Payer: Medicare Other

## 2013-09-23 ENCOUNTER — Telehealth (INDEPENDENT_AMBULATORY_CARE_PROVIDER_SITE_OTHER): Payer: Self-pay

## 2013-09-23 ENCOUNTER — Encounter (INDEPENDENT_AMBULATORY_CARE_PROVIDER_SITE_OTHER): Payer: Medicare Other | Admitting: General Surgery

## 2013-09-23 ENCOUNTER — Encounter (HOSPITAL_BASED_OUTPATIENT_CLINIC_OR_DEPARTMENT_OTHER)
Admission: RE | Admit: 2013-09-23 | Discharge: 2013-09-23 | Disposition: A | Discharge status: Home / Self Care | Payer: Medicare Other | Source: Ambulatory Visit | Attending: General Surgery | Admitting: General Surgery

## 2013-09-23 DIAGNOSIS — Z7901 Long term (current) use of anticoagulants: Secondary | ICD-10-CM | POA: Diagnosis not present

## 2013-09-23 DIAGNOSIS — E039 Hypothyroidism, unspecified: Secondary | ICD-10-CM | POA: Diagnosis not present

## 2013-09-23 DIAGNOSIS — M129 Arthropathy, unspecified: Secondary | ICD-10-CM | POA: Diagnosis not present

## 2013-09-23 DIAGNOSIS — F3289 Other specified depressive episodes: Secondary | ICD-10-CM | POA: Diagnosis not present

## 2013-09-23 DIAGNOSIS — F411 Generalized anxiety disorder: Secondary | ICD-10-CM | POA: Diagnosis not present

## 2013-09-23 DIAGNOSIS — D0590 Unspecified type of carcinoma in situ of unspecified breast: Secondary | ICD-10-CM | POA: Diagnosis not present

## 2013-09-23 DIAGNOSIS — N6089 Other benign mammary dysplasias of unspecified breast: Secondary | ICD-10-CM | POA: Diagnosis not present

## 2013-09-23 DIAGNOSIS — Z01812 Encounter for preprocedural laboratory examination: Secondary | ICD-10-CM | POA: Diagnosis not present

## 2013-09-23 DIAGNOSIS — Z170 Estrogen receptor positive status [ER+]: Secondary | ICD-10-CM | POA: Diagnosis not present

## 2013-09-23 DIAGNOSIS — I10 Essential (primary) hypertension: Secondary | ICD-10-CM | POA: Diagnosis not present

## 2013-09-23 DIAGNOSIS — C50419 Malignant neoplasm of upper-outer quadrant of unspecified female breast: Secondary | ICD-10-CM | POA: Diagnosis not present

## 2013-09-23 DIAGNOSIS — R0602 Shortness of breath: Secondary | ICD-10-CM | POA: Diagnosis not present

## 2013-09-23 DIAGNOSIS — F329 Major depressive disorder, single episode, unspecified: Secondary | ICD-10-CM | POA: Diagnosis not present

## 2013-09-23 DIAGNOSIS — Z86718 Personal history of other venous thrombosis and embolism: Secondary | ICD-10-CM | POA: Diagnosis not present

## 2013-09-23 LAB — CBC WITH DIFFERENTIAL/PLATELET
BASOS PCT: 1 % (ref 0–1)
Basophils Absolute: 0.1 10*3/uL (ref 0.0–0.1)
EOS ABS: 0.2 10*3/uL (ref 0.0–0.7)
Eosinophils Relative: 2 % (ref 0–5)
HCT: 45.1 % (ref 36.0–46.0)
Hemoglobin: 14.9 g/dL (ref 12.0–15.0)
Lymphocytes Relative: 20 % (ref 12–46)
Lymphs Abs: 2 10*3/uL (ref 0.7–4.0)
MCH: 31.2 pg (ref 26.0–34.0)
MCHC: 33 g/dL (ref 30.0–36.0)
MCV: 94.4 fL (ref 78.0–100.0)
Monocytes Absolute: 1.3 10*3/uL — ABNORMAL HIGH (ref 0.1–1.0)
Monocytes Relative: 14 % — ABNORMAL HIGH (ref 3–12)
NEUTROS ABS: 6.2 10*3/uL (ref 1.7–7.7)
Neutrophils Relative %: 63 % (ref 43–77)
PLATELETS: 267 10*3/uL (ref 150–400)
RBC: 4.78 MIL/uL (ref 3.87–5.11)
RDW: 13.9 % (ref 11.5–15.5)
WBC: 9.8 10*3/uL (ref 4.0–10.5)

## 2013-09-23 LAB — PROTIME-INR
INR: 0.99 (ref 0.00–1.49)
PROTHROMBIN TIME: 12.9 s (ref 11.6–15.2)

## 2013-09-23 NOTE — Telephone Encounter (Signed)
Called pt to check on her from the message below. The pt is c/o of the right breast feeling feverish,swelling, and redness. The pt does not feel well either. The pt does not have a thermometer to check her temp at home. The pt stated this all started Saturday when she woke up with a headache and the breast feeling warm. The pt said she feels achy and she took a pain pill yesterday trying to help with headache. The pt is scheduled for re-excision tomorrow with Dr Donne Hazel. I advised pt that I need to speak to Dr Donne Hazel and call her back. The pt is supposed to go get labs drawn this am a [facility] for surgery but I told her to wait till I call her back. The pt understands.

## 2013-09-23 NOTE — Telephone Encounter (Signed)
Please call patient is not feeling well and right breast is swollen and red from last surgery. She is asking if surgery can be prolonged . Please advise

## 2013-09-23 NOTE — Telephone Encounter (Signed)
Called pt back to notify her that she needed to come in today to see Dr Excell Seltzer in urgent office arrive at 3:45/4:00. I advised pt that if Dr Donne Hazel is in the office then he will see her but just not sure about his schedule yet. I advised for pt to go ahead and get the labs drawn at [facility] just in case if the surgery will not get canceled. The pt is going to call [facility] now to see what time she needs to be there since she has to come see [name] at 3:45.

## 2013-09-23 NOTE — Telephone Encounter (Signed)
Have her come in this afternoon to be seen  I have to go to exec comm at around five so maybe urg but if Im there I will see her

## 2013-09-24 ENCOUNTER — Encounter: Payer: Self-pay | Admitting: *Deleted

## 2013-09-24 ENCOUNTER — Encounter (HOSPITAL_BASED_OUTPATIENT_CLINIC_OR_DEPARTMENT_OTHER): Payer: Self-pay | Admitting: *Deleted

## 2013-09-24 ENCOUNTER — Ambulatory Visit (HOSPITAL_BASED_OUTPATIENT_CLINIC_OR_DEPARTMENT_OTHER): Payer: Medicare Other | Admitting: Anesthesiology

## 2013-09-24 ENCOUNTER — Ambulatory Visit (HOSPITAL_BASED_OUTPATIENT_CLINIC_OR_DEPARTMENT_OTHER)
Admission: RE | Admit: 2013-09-24 | Discharge: 2013-09-24 | Disposition: A | Discharge status: Home / Self Care | Payer: Medicare Other | Source: Ambulatory Visit | Attending: General Surgery | Admitting: General Surgery

## 2013-09-24 ENCOUNTER — Encounter (HOSPITAL_BASED_OUTPATIENT_CLINIC_OR_DEPARTMENT_OTHER): Payer: Medicare Other | Admitting: Anesthesiology

## 2013-09-24 ENCOUNTER — Encounter (HOSPITAL_BASED_OUTPATIENT_CLINIC_OR_DEPARTMENT_OTHER)
Admission: RE | Disposition: A | Discharge status: Home / Self Care | Payer: Self-pay | Source: Ambulatory Visit | Attending: General Surgery

## 2013-09-24 DIAGNOSIS — D0590 Unspecified type of carcinoma in situ of unspecified breast: Secondary | ICD-10-CM | POA: Diagnosis not present

## 2013-09-24 DIAGNOSIS — I10 Essential (primary) hypertension: Secondary | ICD-10-CM | POA: Insufficient documentation

## 2013-09-24 DIAGNOSIS — C50419 Malignant neoplasm of upper-outer quadrant of unspecified female breast: Secondary | ICD-10-CM | POA: Insufficient documentation

## 2013-09-24 DIAGNOSIS — Z01812 Encounter for preprocedural laboratory examination: Secondary | ICD-10-CM | POA: Insufficient documentation

## 2013-09-24 DIAGNOSIS — Z86718 Personal history of other venous thrombosis and embolism: Secondary | ICD-10-CM | POA: Insufficient documentation

## 2013-09-24 DIAGNOSIS — Z170 Estrogen receptor positive status [ER+]: Secondary | ICD-10-CM | POA: Insufficient documentation

## 2013-09-24 DIAGNOSIS — N6089 Other benign mammary dysplasias of unspecified breast: Secondary | ICD-10-CM | POA: Insufficient documentation

## 2013-09-24 DIAGNOSIS — D4860 Neoplasm of uncertain behavior of unspecified breast: Secondary | ICD-10-CM | POA: Diagnosis not present

## 2013-09-24 DIAGNOSIS — E039 Hypothyroidism, unspecified: Secondary | ICD-10-CM | POA: Insufficient documentation

## 2013-09-24 DIAGNOSIS — F329 Major depressive disorder, single episode, unspecified: Secondary | ICD-10-CM | POA: Insufficient documentation

## 2013-09-24 DIAGNOSIS — F411 Generalized anxiety disorder: Secondary | ICD-10-CM | POA: Insufficient documentation

## 2013-09-24 DIAGNOSIS — C50919 Malignant neoplasm of unspecified site of unspecified female breast: Secondary | ICD-10-CM | POA: Diagnosis not present

## 2013-09-24 DIAGNOSIS — M129 Arthropathy, unspecified: Secondary | ICD-10-CM | POA: Insufficient documentation

## 2013-09-24 DIAGNOSIS — R0602 Shortness of breath: Secondary | ICD-10-CM | POA: Insufficient documentation

## 2013-09-24 DIAGNOSIS — Z7901 Long term (current) use of anticoagulants: Secondary | ICD-10-CM | POA: Insufficient documentation

## 2013-09-24 DIAGNOSIS — F3289 Other specified depressive episodes: Secondary | ICD-10-CM | POA: Insufficient documentation

## 2013-09-24 HISTORY — PX: RE-EXCISION OF BREAST LUMPECTOMY: SHX6048

## 2013-09-24 SURGERY — EXCISION, LESION, BREAST
Anesthesia: General | Site: Breast | Laterality: Right

## 2013-09-24 MED ORDER — PROPOFOL 10 MG/ML IV BOLUS
INTRAVENOUS | Status: DC | PRN
  Administered 2013-09-24: 170 mg via INTRAVENOUS

## 2013-09-24 MED ORDER — VANCOMYCIN HCL IN DEXTROSE 500-5 MG/100ML-% IV SOLN
INTRAVENOUS | Status: AC
  Filled 2013-09-24: qty 100

## 2013-09-24 MED ORDER — ONDANSETRON HCL 4 MG/2ML IJ SOLN: 4.0000 mg | Freq: Four times a day (QID) | INTRAMUSCULAR | Status: DC | PRN

## 2013-09-24 MED ORDER — VANCOMYCIN HCL IN DEXTROSE 1-5 GM/200ML-% IV SOLN
INTRAVENOUS | Status: AC
  Filled 2013-09-24: qty 200

## 2013-09-24 MED ORDER — LACTATED RINGERS IV SOLN
INTRAVENOUS | Status: DC
  Administered 2013-09-24: 13:00:00 via INTRAVENOUS

## 2013-09-24 MED ORDER — LIDOCAINE HCL (CARDIAC) 20 MG/ML IV SOLN
INTRAVENOUS | Status: DC | PRN
Start: 2013-09-24 — End: 2013-09-24
  Administered 2013-09-24: 75 mg via INTRAVENOUS

## 2013-09-24 MED ORDER — FENTANYL CITRATE 0.05 MG/ML IJ SOLN
INTRAMUSCULAR | Status: AC
  Filled 2013-09-24: qty 4

## 2013-09-24 MED ORDER — BUPIVACAINE HCL (PF) 0.25 % IJ SOLN
INTRAMUSCULAR | Status: AC
  Filled 2013-09-24: qty 30

## 2013-09-24 MED ORDER — MIDAZOLAM HCL 2 MG/2ML IJ SOLN: 1.0000 mg | INTRAMUSCULAR | Status: DC | PRN

## 2013-09-24 MED ORDER — EPHEDRINE SULFATE 50 MG/ML IJ SOLN
INTRAMUSCULAR | Status: DC | PRN
  Administered 2013-09-24 (×2): 10 mg via INTRAVENOUS

## 2013-09-24 MED ORDER — ONDANSETRON HCL 4 MG/2ML IJ SOLN
INTRAMUSCULAR | Status: DC | PRN
  Administered 2013-09-24: 4 mg via INTRAVENOUS

## 2013-09-24 MED ORDER — FENTANYL CITRATE 0.05 MG/ML IJ SOLN: 25.0000 ug | INTRAMUSCULAR | Status: DC | PRN

## 2013-09-24 MED ORDER — VANCOMYCIN HCL 10 G IV SOLR
1500.0000 mg | INTRAVENOUS | Status: AC
  Administered 2013-09-24: 1500 mg via INTRAVENOUS

## 2013-09-24 MED ORDER — OXYCODONE HCL 5 MG PO TABS: 5.0000 mg | ORAL_TABLET | Freq: Four times a day (QID) | ORAL | Status: DC | PRN

## 2013-09-24 MED ORDER — MIDAZOLAM HCL 5 MG/5ML IJ SOLN
INTRAMUSCULAR | Status: DC | PRN
  Administered 2013-09-24: 2 mg via INTRAVENOUS

## 2013-09-24 MED ORDER — DEXAMETHASONE SODIUM PHOSPHATE 4 MG/ML IJ SOLN
INTRAMUSCULAR | Status: DC | PRN
  Administered 2013-09-24: 8 mg via INTRAVENOUS

## 2013-09-24 MED ORDER — FENTANYL CITRATE 0.05 MG/ML IJ SOLN: 50.0000 ug | INTRAMUSCULAR | Status: DC | PRN

## 2013-09-24 MED ORDER — BUPIVACAINE HCL (PF) 0.25 % IJ SOLN
INTRAMUSCULAR | Status: DC | PRN
  Administered 2013-09-24: 10 mL

## 2013-09-24 MED ORDER — FENTANYL CITRATE 0.05 MG/ML IJ SOLN
INTRAMUSCULAR | Status: DC | PRN
  Administered 2013-09-24: 100 ug via INTRAVENOUS

## 2013-09-24 MED ORDER — MIDAZOLAM HCL 2 MG/2ML IJ SOLN
INTRAMUSCULAR | Status: AC
  Filled 2013-09-24: qty 2

## 2013-09-24 SURGICAL SUPPLY — 53 items
ADH SKN CLS APL DERMABOND .7 (GAUZE/BANDAGES/DRESSINGS)
APL SKNCLS STERI-STRIP NONHPOA (GAUZE/BANDAGES/DRESSINGS)
APPLIER CLIP 9.375 MED OPEN (MISCELLANEOUS)
APR CLP MED 9.3 20 MLT OPN (MISCELLANEOUS)
BENZOIN TINCTURE PRP APPL 2/3 (GAUZE/BANDAGES/DRESSINGS) IMPLANT
BINDER BREAST LRG (GAUZE/BANDAGES/DRESSINGS) IMPLANT
BINDER BREAST MEDIUM (GAUZE/BANDAGES/DRESSINGS) IMPLANT
BINDER BREAST XLRG (GAUZE/BANDAGES/DRESSINGS) ×1 IMPLANT
BINDER BREAST XXLRG (GAUZE/BANDAGES/DRESSINGS) IMPLANT
BLADE 15 SAFETY STRL DISP (BLADE) ×1 IMPLANT
CANISTER SUCT 1200ML W/VALVE (MISCELLANEOUS) ×2 IMPLANT
CHLORAPREP W/TINT 26ML (MISCELLANEOUS) ×2 IMPLANT
CLIP APPLIE 9.375 MED OPEN (MISCELLANEOUS) IMPLANT
COVER MAYO STAND STRL (DRAPES) ×2 IMPLANT
COVER TABLE BACK 60X90 (DRAPES) ×2 IMPLANT
DECANTER SPIKE VIAL GLASS SM (MISCELLANEOUS) IMPLANT
DERMABOND ADVANCED (GAUZE/BANDAGES/DRESSINGS)
DERMABOND ADVANCED .7 DNX12 (GAUZE/BANDAGES/DRESSINGS) IMPLANT
DRAPE PED LAPAROTOMY (DRAPES) ×2 IMPLANT
DRSG TEGADERM 4X4.75 (GAUZE/BANDAGES/DRESSINGS) ×2 IMPLANT
ELECT COATED BLADE 2.86 ST (ELECTRODE) ×2 IMPLANT
ELECT REM PT RETURN 9FT ADLT (ELECTROSURGICAL) ×2
ELECTRODE REM PT RTRN 9FT ADLT (ELECTROSURGICAL) ×1 IMPLANT
GLOVE BIO SURGEON STRL SZ7 (GLOVE) ×3 IMPLANT
GLOVE BIOGEL PI IND STRL 7.5 (GLOVE) ×1 IMPLANT
GLOVE BIOGEL PI INDICATOR 7.5 (GLOVE) ×1
GLOVE SURG SS PI 7.0 STRL IVOR (GLOVE) ×1 IMPLANT
GOWN STRL REUS W/ TWL LRG LVL3 (GOWN DISPOSABLE) ×3 IMPLANT
GOWN STRL REUS W/TWL LRG LVL3 (GOWN DISPOSABLE) ×4
KIT MARKER MARGIN INK (KITS) ×1 IMPLANT
NDL HYPO 25X1 1.5 SAFETY (NEEDLE) ×1 IMPLANT
NEEDLE HYPO 25X1 1.5 SAFETY (NEEDLE) ×2 IMPLANT
NS IRRIG 1000ML POUR BTL (IV SOLUTION) ×1 IMPLANT
PACK BASIN DAY SURGERY FS (CUSTOM PROCEDURE TRAY) ×2 IMPLANT
PENCIL BUTTON HOLSTER BLD 10FT (ELECTRODE) ×2 IMPLANT
SLEEVE SCD COMPRESS KNEE MED (MISCELLANEOUS) ×2 IMPLANT
SPONGE GAUZE 4X4 12PLY STER LF (GAUZE/BANDAGES/DRESSINGS) ×2 IMPLANT
SPONGE LAP 4X18 X RAY DECT (DISPOSABLE) ×2 IMPLANT
STRIP CLOSURE SKIN 1/2X4 (GAUZE/BANDAGES/DRESSINGS) ×1 IMPLANT
SUT MNCRL AB 3-0 PS2 18 (SUTURE) IMPLANT
SUT MNCRL AB 4-0 PS2 18 (SUTURE) IMPLANT
SUT SILK 2 0 SH (SUTURE) ×1 IMPLANT
SUT VIC AB 2-0 SH 27 (SUTURE) ×4
SUT VIC AB 2-0 SH 27XBRD (SUTURE) ×1 IMPLANT
SUT VIC AB 3-0 SH 27 (SUTURE) ×2
SUT VIC AB 3-0 SH 27X BRD (SUTURE) ×1 IMPLANT
SUT VIC AB 5-0 PS2 18 (SUTURE) IMPLANT
SUT VICRYL AB 3 0 TIES (SUTURE) IMPLANT
SYR CONTROL 10ML LL (SYRINGE) ×2 IMPLANT
TOWEL OR 17X24 6PK STRL BLUE (TOWEL DISPOSABLE) ×2 IMPLANT
TOWEL OR NON WOVEN STRL DISP B (DISPOSABLE) ×2 IMPLANT
TUBE CONNECTING 20X1/4 (TUBING) ×2 IMPLANT
YANKAUER SUCT BULB TIP NO VENT (SUCTIONS) ×2 IMPLANT

## 2013-09-24 NOTE — Interval H&P Note (Signed)
History and Physical Interval Note:  09/24/2013 1:24 PM  Alicia Vasquez  has presented today for surgery, with the diagnosis of right breast cancer  The various methods of treatment have been discussed with the patient and family. After consideration of risks, benefits and other options for treatment, the patient has consented to  Procedure(s): RE-EXCISION OF RIGHT BREAST LUMPECTOMY (Right) as a surgical intervention .  The patient's history has been reviewed, patient examined, no change in status, stable for surgery.  I have reviewed the patient's chart and labs.  Questions were answered to the patient's satisfaction.     [name]

## 2013-09-24 NOTE — Discharge Instructions (Signed)
[facility] °Office Phone Number [phone] ° °BREAST BIOPSY/ PARTIAL MASTECTOMY: POST OP INSTRUCTIONS ° °Always review your discharge instruction sheet given to you by the facility where your surgery was performed. ° °IF YOU HAVE DISABILITY OR FAMILY LEAVE FORMS, YOU MUST BRING THEM TO THE OFFICE FOR PROCESSING.  DO NOT GIVE THEM TO YOUR DOCTOR. ° °1. A prescription for pain medication may be given to you upon discharge.  Take your pain medication as prescribed, if needed.  If narcotic pain medicine is not needed, then you may take acetaminophen (Tylenol), naprosyn (Alleve) or ibuprofen (Advil) as needed. °2. Take your usually prescribed medications unless otherwise directed °3. If you need a refill on your pain medication, please contact your pharmacy.  They will contact our office to request authorization.  Prescriptions will not be filled after 5pm or on week-ends. °4. You should eat very light the first 24 hours after surgery, such as soup, crackers, pudding, etc.  Resume your normal diet the day after surgery. °5. Most patients will experience some swelling and bruising in the breast.  Ice packs and a good support bra will help.  Wear the breast binder provided or a sports bra for 72 hours day and night.  After that wear a sports bra during the day until you return to the office. Swelling and bruising can take several days to resolve.  °6. It is common to experience some constipation if taking pain medication after surgery.  Increasing fluid intake and taking a stool softener will usually help or prevent this problem from occurring.  A mild laxative (Milk of Magnesia or Miralax) should be taken according to package directions if there are no bowel movements after 48 hours. °7. Unless discharge instructions indicate otherwise, you may remove your bandages 48 hours after surgery and you may shower at that time.  You may have steri-strips (small skin tapes) in place directly over the incision.   These strips should be left on the skin for 7-10 days and will come off on their own.  If your surgeon used skin glue on the incision, you may shower in 24 hours.  The glue will flake off over the next 2-3 weeks.  Any sutures or staples will be removed at the office during your follow-up visit. °8. ACTIVITIES:  You may resume regular daily activities (gradually increasing) beginning the next day.  Wearing a good support bra or sports bra minimizes pain and swelling.  You may have sexual intercourse when it is comfortable. °a. You may drive when you no longer are taking prescription pain medication, you can comfortably wear a seatbelt, and you can safely maneuver your car and apply brakes. °b. RETURN TO WORK:  ______________________________________________________________________________________ °9. You should see your doctor in the office for a follow-up appointment approximately two weeks after your surgery.  Your doctor’s nurse will typically make your follow-up appointment when she calls you with your pathology report.  Expect your pathology report 3-4 business days after your surgery.  You may call to check if you do not hear from us after three days. °10. OTHER INSTRUCTIONS: _______________________________________________________________________________________________ _____________________________________________________________________________________________________________________________________ °_____________________________________________________________________________________________________________________________________ °_____________________________________________________________________________________________________________________________________ ° °WHEN TO CALL DR WAKEFIELD: °1. Fever over 101.0 °2. Nausea and/or vomiting. °3. Extreme swelling or bruising. °4. Continued bleeding from incision. °5. Increased pain, redness, or drainage from the incision. ° °The clinic staff is available to  answer your questions during regular business hours.  Please don’t hesitate to call and ask to speak to one of the nurses for   clinical concerns.  If you have a medical emergency, go to the nearest emergency room or call 911.  A surgeon from [facility] is always on call at the hospital. ° °For further questions, please visit [email or web address] ° °Post Anesthesia Home Care Instructions ° °Activity: °Get plenty of rest for the remainder of the day. A responsible adult should stay with you for 24 hours following the procedure.  °For the next 24 hours, DO NOT: °-Drive a car °-Operate machinery °-Drink alcoholic beverages °-Take any medication unless instructed by your physician °-Make any legal decisions or sign important papers. ° °Meals: °Start with liquid foods such as gelatin or soup. Progress to regular foods as tolerated. Avoid greasy, spicy, heavy foods. If nausea and/or vomiting occur, drink only clear liquids until the nausea and/or vomiting subsides. Call your physician if vomiting continues. ° °Special Instructions/Symptoms: °Your throat may feel dry or sore from the anesthesia or the breathing tube placed in your throat during surgery. If this causes discomfort, gargle with warm salt water. The discomfort should disappear within 24 hours. ° °

## 2013-09-24 NOTE — Transfer of Care (Signed)
Immediate Anesthesia Transfer of Care Note  Patient: Alicia Vasquez  Procedure(s) Performed: Procedure(s): RE-EXCISION OF RIGHT BREAST LUMPECTOMY (Right)  Patient Location: PACU  Anesthesia Type:General  Level of Consciousness: awake, alert , oriented and patient cooperative  Airway & Oxygen Therapy: Patient Spontanous Breathing and Patient connected to face mask oxygen  Post-op Assessment: Report given to PACU RN and Post -op Vital signs reviewed and stable  Post vital signs: Reviewed and stable  Complications: No apparent anesthesia complications

## 2013-09-24 NOTE — Anesthesia Postprocedure Evaluation (Signed)
Anesthesia Post Note  Patient: Alicia Vasquez  Procedure(s) Performed: Procedure(s) (LRB): RE-EXCISION OF RIGHT BREAST LUMPECTOMY (Right)  Anesthesia type: General  Patient location: PACU  Post pain: Pain level controlled and Adequate analgesia  Post assessment: Post-op Vital signs reviewed, Patient's Cardiovascular Status Stable, Respiratory Function Stable, Patent Airway and Pain level controlled  Last Vitals:  Filed Vitals:   09/24/13 1600  BP:   Pulse: 90  Temp:   Resp: 17    Post vital signs: Reviewed and stable  Level of consciousness: awake, alert  and oriented  Complications: No apparent anesthesia complications

## 2013-09-24 NOTE — Progress Notes (Unsigned)
Ordered oncotype. Faxed requisition to pathology and confirmed receipt with Jeannie. 

## 2013-09-24 NOTE — Op Note (Signed)
Preoperative diagnosis: clniical stage II right breast cancer , positive medial margin Postoperative diagnosis: same as above Procedure: Right breast re-excision lumpectomy of medial margin Surgeon: Dr Serita Grammes EBL: minimal Specimen: Right breast medial margin marked with paint Right breast medial superior tissue marked short superior, long lateral, double deep Anesthesia: general Drains: none Complications: none Sponge and needle count correct times two Disposition to recovery stable  Indications: This is a 70 yof who I took to or recently for right breast seed guided lumpectomy and limited axillary dissection.  She ends up having another tumor that has a positive medial margin and we discussed going back to or for re-excision of the medial margin.  I have removed her drain already.  Procedure: After informed consent was obtained the patient was taken to the operating room. She was given vancomycin due to her allergies.  Sequential compression devices were on her legs.  She was placed under general anesthesia without complication.  Her right breast was prepped and draped in the standard sterile surgical fashion.  A surgical timeout was then performed.  I reentered her prior incision.  I evacuated some fluid.  I then identified her medial margin and used cautery to widely remove this margin in an attempt to get it clear.  This was marked with paint.  I also removed another small area in the medial aspect superiorly and marked this with suture.  I then closed this with 2-0 vicryl.  I then closed the dermis with 3-0 vicryl and the skin with 4-0 monocryl.  Dermabond was placed. Steristrips were placed over that.  Marcaine was infiltrated throughout the cavity.  A breast binder was placed.  She was extubated and transferred to recovery stable.

## 2013-09-24 NOTE — Anesthesia Procedure Notes (Signed)
Procedure Name: LMA Insertion Performed by: Eylin Pontarelli, [place] Pre-anesthesia Checklist: Patient identified, Emergency Drugs available, Suction available and Patient being monitored Patient Re-evaluated:Patient Re-evaluated prior to inductionOxygen Delivery Method: Circle System Utilized Preoxygenation: Pre-oxygenation with 100% oxygen Intubation Type: IV induction Ventilation: Mask ventilation without difficulty LMA: LMA inserted LMA Size: 4.0 Number of attempts: 1 Airway Equipment and Method: bite block Placement Confirmation: positive ETCO2 Tube secured with: Tape Dental Injury: Teeth and Oropharynx as per pre-operative assessment      

## 2013-09-24 NOTE — Anesthesia Preprocedure Evaluation (Signed)
Anesthesia Evaluation  Patient identified by MRN, date of birth, ID band Patient awake    Reviewed: Allergy & Precautions, H&P , NPO status , Patient's Chart, lab work & pertinent test results  Airway Mallampati: II  Neck ROM: full    Dental   Pulmonary shortness of breath,          Cardiovascular hypertension,     Neuro/Psych Anxiety Depression  Neuromuscular disease    GI/Hepatic   Endo/Other  Hypothyroidism Morbid obesity  Renal/GU      Musculoskeletal  (+) Arthritis -,   Abdominal   Peds  Hematology   Anesthesia Other Findings   Reproductive/Obstetrics                           Anesthesia Physical Anesthesia Plan  ASA: II  Anesthesia Plan: General   Post-op Pain Management:    Induction: Intravenous  Airway Management Planned: LMA  Additional Equipment:   Intra-op Plan:   Post-operative Plan:   Informed Consent: I have reviewed the patients History and Physical, chart, labs and discussed the procedure including the risks, benefits and alternatives for the proposed anesthesia with the patient or authorized representative who has indicated his/her understanding and acceptance.     Plan Discussed with: CRNA, Anesthesiologist and Surgeon  Anesthesia Plan Comments:         Anesthesia Quick Evaluation

## 2013-09-24 NOTE — H&P (View-Only) (Signed)
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 She has since undergone an ultrasound that shows a 1.3x1.2x1.5 cm right breast mass with a 1.6 cm right axillary node. 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   Past Medical History   Diagnosis  Date   .  Allergy    .  Hypertension    .  Hypothyroid    .  Blood transfusion without reported diagnosis    .  Pneumonia    .  DVT (deep vein thrombosis) in pregnancy    .  Breast cancer  07/12/13     invasive mammary carcinoma   .  Anxiety    .  Arthritis     Past Surgical History   Procedure  Laterality  Date   .  Removal of teeth   04/19/2012     13 teeth removed   .  Biospy       female organs    Family History   Problem  Relation  Age of Onset   .  Cancer  Brother      prostate   .  Heart disease  Brother    .  Colon cancer  Brother    Social History  History   Substance Use Topics   .  Smoking status:  Never Smoker   .  Smokeless tobacco:  Not on file   .  Alcohol Use:  No    Allergies   Allergen  Reactions   .  Anesthetics, Amide  Other (See Comments)     ELEVATE BLOOD PRESSURE   .  Benadryl [Diphenhydramine Hcl]      dizziness   .  Carbocaine [Mepivacaine Hcl]  Other (See Comments)     Elevated blood pressure   .  Codeine  Nausea Only and Other (See Comments)     dizziness   .  Epinephrine  Other (See Comments)     Elevated blood pressure   .  Sulfa Antibiotics  Other (See Comments)     dizziness   .  Vicodin [Hydrocodone-Acetaminophen]  Nausea Only   .  Penicillins  Rash    Current Outpatient Prescriptions   Medication  Sig  Dispense  Refill   .  albuterol (PROVENTIL HFA;VENTOLIN HFA) 108 (90 BASE) MCG/ACT inhaler  Inhale 2 puffs into the lungs every 6 (six) hours as  needed.  1 Inhaler  0   .  lisinopril (PRINIVIL,ZESTRIL) 10 MG tablet  Take 1 tablet (10 mg total) by mouth daily.  90 tablet  3   .  SYNTHROID 100 MCG tablet  Take 1 tablet (100 mcg total) by mouth daily.  90 tablet  3   .  warfarin (COUMADIN) 5 MG tablet  Take as directed by anticoagulation clinic  60 tablet  3    Current Facility-Administered Medications   Medication  Dose  Route  Frequency  Provider  Last Rate  Last Dose   .  ipratropium (ATROVENT) nebulizer solution 0.5 mg  0.5 mg  Nebulization  Once  Hayden Rasmussen, MD     Review of Systems  Review of Systems  Constitutional: Negative for fever, chills and unexpected weight change.  Respiratory: Positive for cough  and shortness of breath. Negative for wheezing.  Cardiovascular: Positive for chest pain and palpitations. Negative for leg swelling.  Gastrointestinal: Negative for nausea, vomiting, abdominal pain, diarrhea, constipation, blood in stool, abdominal distention and anal bleeding.   Physical Exam  Physical Exam  Vitals reviewed.  Constitutional: She appears well-developed and well-nourished.  Neck: Neck supple.  Cardiovascular: Normal rate, regular rhythm and normal heart sounds.  Pulmonary/Chest: Effort normal and breath sounds normal. She has no wheezes. She has no rales. Right breast exhibits no inverted nipple, no mass, no nipple discharge, no skin change and no tenderness. Left breast exhibits no inverted nipple, no mass, no nipple discharge, no skin change and no tenderness.  Abdominal: Soft. There is no tenderness.  Lymphadenopathy:  She has no cervical adenopathy.  She has axillary adenopathy.  Right axillary: Lateral adenopathy present.  Left axillary: No lateral adenopathy present. Right: No supraclavicular adenopathy present.  Left: No supraclavicular adenopathy present.   Data Reviewed  ULTRASOUND BILATERAL BREAST  DIGITAL BREAST TOMOSYNTHESIS Digital breast tomosynthesis images are  acquired in two  projections. These images are reviewed in  combination with the digital mammogram, confirming the findings  below.  COMPARISON:  Prior examinations dating back to 10/02/2009.  ACR BREAST DENSITY:  ACR Breast Density Category c: The breast tissue is heterogeneously  dense, which may obscure small masses.  FINDINGS:  There is a 1.5 cm spiculated mass within the upper-outer quadrant of  the right breast posterior depth. Additionally there is an enlarged  right axillary lymph node.  Within the left breast 11 o'clock position anterior depth there is a  1.5 cm mass underlying the palpable marker. Additionally within the  1 o'clock position there is a 1.1 cm circumscribed mass within the  middle depth.  Mammographic images were processed with CAD.  On physical exam, I palpate no discrete mass within either breast.  Ultrasound is performed, showing a 1.3 x 1.2 x 1.5 cm irregular  hypoechoic mass with internal vascularity within the right breast 11  o'clock position 8 cm from the nipple. Additionally there is a bulky  enlarged right axillary lymph node measuring up to 1.6 cm.  Within the left breast 11 o'clock position 3 cm from the nipple  there is a 1.1 x 0.9 x 1.4 cm oval hypoechoic circumscribed mass  with mixed internal echogenicity. No internal vascularity is  identified. Additionally within the left breast 1 o'clock position 4  cm from the nipple there is a 0.9 x 0.6 x 0.9 cm simple cyst. No  left axillary lymphadenopathy.  IMPRESSION:  1. Suspicious right breast mass with enlarged right axillary lymph  node concerning for primary breast malignancy and metastatic  adenopathy. Ultrasound-guided core needle biopsy of both the  suspicious right breast mass and enlarged right axillary lymph node  is recommended. Potentially markedly complicated cyst versus solid  mass within the left breast 11 o'clock position at the site of  patient's reported palpable abnormality. Cyst aspiration  versus  ultrasound-guided core needle biopsy of this mass is recommended.  RECOMMENDATION:  1. Ultrasound-guided core needle biopsy suspicious right breast mass  and enlarged right axillary lymph node. Diagnostic cyst aspiration  versus core needle biopsy of complicated cyst versus polyp mass  within the anterior aspect of the left breast 11 o'clock position.  Procedures scheduled for 07/11/2013 at 1 p.m.  I have discussed the findings and recommendations with the patient.  Results were also provided in writing at the conclusion of the  visit.  Assessment  Clinical stage II right breast cancer  Plan  Right breast radioactive seed guided lumpectomy and targeted right axillary node excision  We discussed the staging and pathophysiology of breast cancer. We discussed all of the different options for treatment for breast cancer including surgery, chemotherapy, radiation therapy, Herceptin, and antiestrogen therapy.  We discussed an axillary node dissection vs an excision of involved node. I don't think full alnd is needed and will give her any benefit more than excision of the involved node followed by axillary radiotherapy. Her risk of LE is significant and I cannot promise her that her survival will be any better.  We discussed the options for treatment of the breast cancer which included lumpectomy versus a mastectomy. We discussed the performance of the lumpectomy with a wire placement. We discussed up to a 5% chance of a positive margin requiring reexcision in the operating room. We also discussed that she may need radiation therapy or antiestrogen therapy or both if she undergoes lumpectomy. We discussed the mastectomy and the postoperative care for that as well. We discussed that there is no difference in her survival whether she undergoes lumpectomy with radiation therapy or antiestrogen therapy versus a mastectomy.  We discussed the risks of operation including bleeding, infection, possible  reoperation. She understands her further therapy will be based on what her stages at the time of her operation.

## 2013-09-25 ENCOUNTER — Telehealth (INDEPENDENT_AMBULATORY_CARE_PROVIDER_SITE_OTHER): Payer: Self-pay

## 2013-09-25 ENCOUNTER — Encounter (HOSPITAL_BASED_OUTPATIENT_CLINIC_OR_DEPARTMENT_OTHER): Payer: Self-pay | Admitting: General Surgery

## 2013-09-25 NOTE — Telephone Encounter (Signed)
Tried calling pt but phone keeps hanging up. I called pt's friend [name] home [facility] stating that I changed the pt's post op time on 5/5 with Dr Donne Hazel for the pt to arrive on 5/5 at 11:00/11:10. I asked for Belenda Cruise to give Jeannie the message b/c [facility] phone keeps acting up. If any questions please call me.

## 2013-09-26 ENCOUNTER — Encounter (INDEPENDENT_AMBULATORY_CARE_PROVIDER_SITE_OTHER): Payer: Medicare Other | Admitting: General Surgery

## 2013-09-27 ENCOUNTER — Telehealth: Payer: Self-pay | Admitting: *Deleted

## 2013-09-27 ENCOUNTER — Other Ambulatory Visit (INDEPENDENT_AMBULATORY_CARE_PROVIDER_SITE_OTHER): Payer: Self-pay | Admitting: *Deleted

## 2013-09-27 ENCOUNTER — Telehealth (INDEPENDENT_AMBULATORY_CARE_PROVIDER_SITE_OTHER): Payer: Self-pay

## 2013-09-27 ENCOUNTER — Ambulatory Visit (HOSPITAL_COMMUNITY)
Admission: RE | Admit: 2013-09-27 | Discharge: 2013-09-27 | Disposition: A | Discharge status: Home / Self Care | Payer: Medicare Other | Source: Ambulatory Visit | Attending: General Surgery | Admitting: General Surgery

## 2013-09-27 ENCOUNTER — Other Ambulatory Visit (HOSPITAL_COMMUNITY): Payer: Self-pay | Admitting: General Surgery

## 2013-09-27 DIAGNOSIS — R0789 Other chest pain: Secondary | ICD-10-CM

## 2013-09-27 DIAGNOSIS — Z9889 Other specified postprocedural states: Secondary | ICD-10-CM

## 2013-09-27 DIAGNOSIS — M79669 Pain in unspecified lower leg: Secondary | ICD-10-CM

## 2013-09-27 DIAGNOSIS — M79609 Pain in unspecified limb: Secondary | ICD-10-CM | POA: Insufficient documentation

## 2013-09-27 DIAGNOSIS — M79605 Pain in left leg: Secondary | ICD-10-CM

## 2013-09-27 DIAGNOSIS — C50919 Malignant neoplasm of unspecified site of unspecified female breast: Secondary | ICD-10-CM | POA: Insufficient documentation

## 2013-09-27 DIAGNOSIS — Z7901 Long term (current) use of anticoagulants: Secondary | ICD-10-CM | POA: Diagnosis not present

## 2013-09-27 DIAGNOSIS — Z86718 Personal history of other venous thrombosis and embolism: Secondary | ICD-10-CM | POA: Diagnosis not present

## 2013-09-27 DIAGNOSIS — C50411 Malignant neoplasm of upper-outer quadrant of right female breast: Secondary | ICD-10-CM

## 2013-09-27 DIAGNOSIS — M79604 Pain in right leg: Secondary | ICD-10-CM

## 2013-09-27 NOTE — Telephone Encounter (Signed)
Joaquim Lai at [facility] called with result of doppler. Negative for DVT. She is sending pt home.

## 2013-09-27 NOTE — Telephone Encounter (Signed)
Called pt to notify her of the pathology results showing everything has been removed now and no more surgery per Dr Donne Hazel. The pt mentioned to me that both of legs hurting her today especially the calves. The pt is having a hard time walking. I advised pt that I would call her back after speaking with a physician. I called Dr Donne Hazel to notify him and he said the pt needs to have a bilateral doppler study to r/o DVT or go to the ER.  I scheduled pt for STAT bilateral lower extremity doppler study to be done at [facility]. I notified the pt to go now to [facility]. Vascular will call with a call report.  I faxed a STAT order to vascular attn:Cindy [phone] to help place the order in epic b/c I was having trouble with scheduling the correct test.

## 2013-09-27 NOTE — Progress Notes (Signed)
VASCULAR LAB PRELIMINARY  PRELIMINARY  PRELIMINARY  PRELIMINARY  Bilateral lower extremity venous duplex completed.    Preliminary report:  Negative for deep and superficial vein thrombosis bilaterally.  Report called to [facility] in Dr. Cristal Generous office.  She stated that she would notify Dr. Donne Hazel and patient would be called if any changes of her care.  Margarette Canada, RVT 09/27/2013, 5:17 PM

## 2013-09-27 NOTE — Telephone Encounter (Signed)
Called pt to r/s appt with Dr. Humphrey Rolls d/t LOA.  Scheduled pt to see Dr. Jana Hakim on 10/09/13 at 1:30.  Confirmed new appt date and time with pt.  Pt denies further needs at this time.

## 2013-09-30 ENCOUNTER — Telehealth (INDEPENDENT_AMBULATORY_CARE_PROVIDER_SITE_OTHER): Payer: Self-pay

## 2013-09-30 DIAGNOSIS — C50919 Malignant neoplasm of unspecified site of unspecified female breast: Secondary | ICD-10-CM | POA: Diagnosis not present

## 2013-09-30 NOTE — Telephone Encounter (Signed)
Message copied by Illene Regulus on Mon Sep 30, 2013  1:12 PM ------      Message from: [place], [place]      Created: Sun Sep 29, 2013  9:44 PM       Would you call her to see how she is doing early Monday her [name] was negative. She needs inr check soon also ------

## 2013-09-30 NOTE — Telephone Encounter (Signed)
Called pt to check on her from Friday with the leg pain and swelling. The pt is still doing the same with leg pain not really moving around a lot. I advised pt that the doppler study was all negative but she really needs to call her PCP or ortho doc. To let them know about the leg pain. The pt has a f/u appt with Dr Donne Hazel for tomorrow along the the Coumadin clinic. The pt said she will call her ortho doc. I advised her that I would see her tomorrow. The pt understands.

## 2013-10-01 ENCOUNTER — Ambulatory Visit (INDEPENDENT_AMBULATORY_CARE_PROVIDER_SITE_OTHER): Payer: Medicare Other | Admitting: General Practice

## 2013-10-01 ENCOUNTER — Encounter (INDEPENDENT_AMBULATORY_CARE_PROVIDER_SITE_OTHER): Payer: Medicare Other | Admitting: General Surgery

## 2013-10-01 ENCOUNTER — Ambulatory Visit (INDEPENDENT_AMBULATORY_CARE_PROVIDER_SITE_OTHER): Payer: Medicare Other | Admitting: General Surgery

## 2013-10-01 ENCOUNTER — Encounter: Payer: Self-pay | Admitting: *Deleted

## 2013-10-01 ENCOUNTER — Encounter (INDEPENDENT_AMBULATORY_CARE_PROVIDER_SITE_OTHER): Payer: Self-pay | Admitting: General Surgery

## 2013-10-01 ENCOUNTER — Encounter (HOSPITAL_COMMUNITY): Payer: Self-pay

## 2013-10-01 VITALS — BP 128/82 | HR 77 | Temp 98.5°F | Resp 16 | Ht 64.0 in | Wt 232.0 lb

## 2013-10-01 DIAGNOSIS — Z5181 Encounter for therapeutic drug level monitoring: Secondary | ICD-10-CM

## 2013-10-01 DIAGNOSIS — I82409 Acute embolism and thrombosis of unspecified deep veins of unspecified lower extremity: Secondary | ICD-10-CM | POA: Diagnosis not present

## 2013-10-01 DIAGNOSIS — I82402 Acute embolism and thrombosis of unspecified deep veins of left lower extremity: Secondary | ICD-10-CM

## 2013-10-01 DIAGNOSIS — Z09 Encounter for follow-up examination after completed treatment for conditions other than malignant neoplasm: Secondary | ICD-10-CM

## 2013-10-01 LAB — POCT INR: INR: 1.8

## 2013-10-01 NOTE — Progress Notes (Signed)
Subjective:     Patient ID: Alicia Vasquez, female   DOB: [birth year], 73 y.o.   MRN: [id number]  HPI This is a 73 year old female who was seen in the multidisciplinary clinic initially. She underwent a lumpectomy and a targeted axillary node removal. She has multiple comorbidities. She is on Coumadin for a DVT history. She underwent initial lumpectomy with targeted axillary lymph node removal. One out of 4 lymph nodes was positive. On her lumpectomy specimen she ended up having 2 separate tumors. The margin of the second tumor was found to be positive at the medial margin. 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 She returns today postoperatively doing pretty well. She had had some bilateral calf pain and I did send her to get an ultrasound which shows no evidence of a DVT. Today mostly she complains of right knee pain which was pre-existing the second operation. She has an appt with [facility] next week. She's having her INR checked for Coumadin later today. She has an appointment to see medical oncology on the 13th.  Review of Systems     Objective:   Physical Exam Healing right breast incision without infection, drain site clean    Assessment:     Stage II left breast cancer     Plan:     She is doing well overall from her surgery now. She is going to see orthopedics for her knee pain. She does not have a DVT. She was seen medical oncology decide on adjuvant therapy next week. I will plan on seeing her back after that depending on what they decide to do. I told her another week she can be released to full activity.

## 2013-10-01 NOTE — Progress Notes (Signed)
Pre visit review using our clinic review tool, if applicable. No additional management support is needed unless otherwise documented below in the visit note. 

## 2013-10-01 NOTE — Progress Notes (Signed)
Received oncotype DX score 4/7%.  Gave Dr. Jana Hakim a copy and took original to HIM.

## 2013-10-02 ENCOUNTER — Ambulatory Visit (INDEPENDENT_AMBULATORY_CARE_PROVIDER_SITE_OTHER): Payer: Medicare Other | Admitting: Family Medicine

## 2013-10-02 ENCOUNTER — Ambulatory Visit: Payer: Medicare Other

## 2013-10-02 VITALS — BP 120/80 | HR 71 | Temp 97.4°F | Resp 16 | Ht 64.0 in | Wt 233.0 lb

## 2013-10-02 DIAGNOSIS — IMO0002 Reserved for concepts with insufficient information to code with codable children: Secondary | ICD-10-CM | POA: Diagnosis not present

## 2013-10-02 DIAGNOSIS — M25561 Pain in right knee: Secondary | ICD-10-CM

## 2013-10-02 DIAGNOSIS — M25569 Pain in unspecified knee: Secondary | ICD-10-CM

## 2013-10-02 DIAGNOSIS — M1711 Unilateral primary osteoarthritis, right knee: Secondary | ICD-10-CM

## 2013-10-02 DIAGNOSIS — M1710 Unilateral primary osteoarthritis, unspecified knee: Secondary | ICD-10-CM | POA: Diagnosis not present

## 2013-10-02 NOTE — Patient Instructions (Signed)
You have an appointment at [facility] on 10/09/13; they will be able to help you more with your knee.  I think your knee is mostly hurting because of severe arthritis.  Use the knee brace and you can take your oxycodone as needed.  Try to apply some ice to your knee a couple of times a day for about 20 minutes.  Let me know if you have any other problems in the meantime

## 2013-10-02 NOTE — Progress Notes (Signed)
[facility] [address], [place] [address] [phone]  Date:  10/02/2013   Name:  Alicia Vasquez   DOB:  10-May-1941   MRN:  [id number]  PCP:  [name], MD    Chief Complaint: Knee Pain   History of Present Illness:  Alicia Vasquez is a 73 y.o. very pleasant female patient who presents with the following:  Recent diagnosis of breast cancer and DVT.   She was seen by Dr. Donne Hazel yesterday- she had an ultrasound that was negative for DVT a few days ago.  She will see [facility] ortho next week for this knee pain. He has had the knee pain for an unclear amount of time She has noted right knee pain for about one month.  The pain has been worse "every day."  She is ok as long as she is not up on the knee.   She did fall on the ice a long time ago- in the 1990s.  She was treated for a possible joint infection it sounds like.   She had a negative BLE doppler on 5/2.  She is taking coumadin as she had a DVT in her left leg. She still takes coumadin- yesterday her INR was 1.9 per her recollection.    She had some x-rays of her right knee last June which showed degenerative change.  She does have some oxycodone to use as needed for pain.    Patient Active Problem List   Diagnosis Date Noted  . Depression 07/18/2013  . Breast cancer of upper-outer quadrant of right female breast 07/15/2013  . Encounter for therapeutic drug monitoring 07/05/2013  . Overactive bladder 01/30/2013  . DVT (deep venous thrombosis), left 12/19/2012  . HTN (hypertension) 09/27/2011  . Hearing loss 09/27/2011  . Chest wall pain 09/27/2011  . Seasonal allergies 09/27/2011  . SOB (shortness of breath), related to deconditioning 08/26/2011  . Hypothyroid 08/26/2011    Past Medical History  Diagnosis Date  . Allergy   . Hypertension   . Hypothyroid   . Blood transfusion without reported diagnosis   . Pneumonia   . DVT (deep vein thrombosis) in pregnancy   . Breast cancer  07/12/13    invasive mammary carcinoma  . Anxiety   . Arthritis     Past Surgical History  Procedure Laterality Date  . Removal of teeth  04/19/2012    13 teeth removed  . Biospy      female organs  . Dilation and curettage of uterus    . Breast lumpectomy with radioactive seed localization Right 09/09/2013    Procedure: BREAST LUMPECTOMY WITH RADIOACTIVE SEED LOCALIZATION WITH AXILLARY NODE EXCISION;  Surgeon: [name], MD;  Location: [place] [facility];  Service: General;  Laterality: Right;  . Re-excision of breast lumpectomy Right 09/24/2013    Procedure: RE-EXCISION OF RIGHT BREAST LUMPECTOMY;  Surgeon: [name], MD;  Location: [place];  Service: General;  Laterality: Right;    History  Substance Use Topics  . Smoking status: Never Smoker   . Smokeless tobacco: Not on file  . Alcohol Use: No    Family History  Problem Relation Age of Onset  . Cancer Brother     prostate  . Heart disease Brother   . Colon cancer Brother     Allergies  Allergen Reactions  . Anesthetics, Amide Other (See Comments)    ELEVATE BLOOD PRESSURE  . Benadryl [Diphenhydramine Hcl]     dizziness  .  Carbocaine [Mepivacaine Hcl] Other (See Comments)    Elevated blood pressure  . Codeine Nausea Only and Other (See Comments)    dizziness  . Epinephrine Other (See Comments)    Elevated blood pressure  . Sulfa Antibiotics Other (See Comments)    dizziness  . Vicodin [Hydrocodone-Acetaminophen] Nausea Only  . Penicillins Rash    Medication list has been reviewed and updated.  Current Outpatient Prescriptions on File Prior to Visit  Medication Sig Dispense Refill  . acetaminophen (TYLENOL) 500 MG tablet Take 500 mg by mouth every 6 (six) hours as needed for mild pain, fever or headache.      . albuterol (PROVENTIL HFA;VENTOLIN HFA) 108 (90 BASE) MCG/ACT inhaler Inhale 2 puffs into the lungs every 6 (six) hours as needed for wheezing.      Marland Kitchen  ipratropium (ATROVENT) 0.03 % nasal spray Place 2 sprays into both nostrils daily as needed for rhinitis.      Marland Kitchen levothyroxine (SYNTHROID) 100 MCG tablet Take 100 mcg by mouth daily.      Marland Kitchen lisinopril (PRINIVIL,ZESTRIL) 10 MG tablet Take 10 mg by mouth daily.      Marland Kitchen oxyCODONE (OXY IR/ROXICODONE) 5 MG immediate release tablet Take 1 tablet (5 mg total) by mouth every 6 (six) hours as needed.  20 tablet  0  . warfarin (COUMADIN) 5 MG tablet Take 5-7.5 mg by mouth daily. Take 10 mg on Monday, Wednesday and Friday and 7.5 mg all other days       No current facility-administered medications on file prior to visit.    Review of Systems:  As per HPI- otherwise negative.   Physical Examination: Filed Vitals:   10/02/13 1355  BP: 120/80  Pulse: 71  Temp: 97.4 F (36.3 C)  Resp: 16   Filed Vitals:   10/02/13 1355  Height: 5\' 4"  (1.626 m)  Weight: 233 lb (105.688 kg)   Body mass index is 39.97 kg/(m^2). Ideal Body Weight: Weight in (lb) to have BMI = 25: 145.3  GEN: WDWN, NAD, Non-toxic, A & O x 3, obese, looks well HEENT: Atraumatic, Normocephalic. Neck supple. No masses, No LAD. Ears and Nose: No external deformity. CV: RRR, No M/G/R. No JVD. No thrill. No extra heart sounds. PULM: CTA B, no wheezes, crackles, rhonchi. No retractions. No resp. distress. No accessory muscle use. EXTR: No c/c/e NEURO she is using a cane right now to support her right knee.   PSYCH: Normally interactive. Conversant. Not depressed or anxious appearing.  Calm demeanor.  Right knee: she has crepitus and medial joint line tenderness.  No warmth, effusion or redness.   [facility] reading (PRIMARY) by  Dr. Lorelei Pont. Right knee: severe degenerative change, no acute fracture or dislocation  RIGHT KNEE - COMPLETE 4+ VIEW  COMPARISON: None.  FINDINGS: There is no fracture. Tricompartmental osteoarthritis is present. No effusion. Prominent marginal osteophyte present in the suprapatellar region. Osteoarthritis  is severe in the medial compartment and mild in the lateral compartment. Severe patellofemoral osteoarthritis. Degenerative lateral subluxation of the tibia relative to the femoral condyles.  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  Assessment and Plan: Osteoarthritis of right knee - Plan: DG Knee Complete 4 Views Right  Pain in right knee - Plan: DG Knee Complete 4 Views Right  Nethra is here today with right knee pain of fairly long duration, likely due to severe arthritis. Recommended that we let ortho attempt a joint injection if they feel it is  appropriate.   Placed in a hinged knee brace for support.  Suggested a walker but she prefers to stick with her cane.  She will be seen at [facility] next week.  If she has any change or worsening of her condition in the meantime she will let us know  Signed Lamar Blinks, MD

## 2013-10-08 ENCOUNTER — Ambulatory Visit: Payer: Medicare Other | Admitting: Oncology

## 2013-10-09 ENCOUNTER — Ambulatory Visit (HOSPITAL_BASED_OUTPATIENT_CLINIC_OR_DEPARTMENT_OTHER): Payer: Medicare Other | Admitting: Oncology

## 2013-10-09 ENCOUNTER — Telehealth: Payer: Self-pay | Admitting: Oncology

## 2013-10-09 VITALS — BP 150/73 | HR 75 | Temp 97.7°F | Resp 20 | Ht 64.0 in | Wt 233.3 lb

## 2013-10-09 DIAGNOSIS — F329 Major depressive disorder, single episode, unspecified: Secondary | ICD-10-CM | POA: Diagnosis not present

## 2013-10-09 DIAGNOSIS — F32A Depression, unspecified: Secondary | ICD-10-CM

## 2013-10-09 DIAGNOSIS — IMO0002 Reserved for concepts with insufficient information to code with codable children: Secondary | ICD-10-CM | POA: Diagnosis not present

## 2013-10-09 DIAGNOSIS — E039 Hypothyroidism, unspecified: Secondary | ICD-10-CM | POA: Diagnosis not present

## 2013-10-09 DIAGNOSIS — Z923 Personal history of irradiation: Secondary | ICD-10-CM

## 2013-10-09 DIAGNOSIS — M1710 Unilateral primary osteoarthritis, unspecified knee: Secondary | ICD-10-CM | POA: Diagnosis not present

## 2013-10-09 DIAGNOSIS — F3289 Other specified depressive episodes: Secondary | ICD-10-CM | POA: Diagnosis not present

## 2013-10-09 DIAGNOSIS — C50419 Malignant neoplasm of upper-outer quadrant of unspecified female breast: Secondary | ICD-10-CM | POA: Diagnosis not present

## 2013-10-09 DIAGNOSIS — R0602 Shortness of breath: Secondary | ICD-10-CM

## 2013-10-09 DIAGNOSIS — C50411 Malignant neoplasm of upper-outer quadrant of right female breast: Secondary | ICD-10-CM

## 2013-10-09 NOTE — Telephone Encounter (Signed)
per pof to [facility] pt appt/ref to [facility]/PT-adv PT will call to make appt

## 2013-10-09 NOTE — Progress Notes (Signed)
[place]  [phone] [phone]     ID: Alicia Vasquez OB: [birth year]  MR#: [id number]  CSN#:[id number]  PCP: Alicia Haber, MD GYN:   SU: Alicia Vasquez OTHER MD: Alicia Vasquez, Alicia Vasquez  CHIEF COMPLAINT:  BREAST CANCER HISTORY: From doctor Alicia Vasquez's intake node 07/24/2013:  "73 y.o. female. Who presented with SOB and had a CT chest performed that revealed a right breast mass. Mammogram/ultrsound on 2/13 showed a mass in the 11 o'clock position in the right breast measuring 1.5 cm. Also noted was a right axillary LN measuring 1.6 cm. MRI not performed. Biopsy of mass and lymph done. 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 Lymph node + for metastatic carcinoma."  [On 09/09/2013 the patient underwent right lumpectomy and sentinel lymph node sampling. This showed ([id number]) multifocal invasive ductal carcinoma, grade 1, the largest lesion measuring 1.8 cm, the second lesion 1.2 cm. One of 4 sentinel lymph nodes was positive, with extracapsular extension. Margins were positive. HER-2 was repeated and was again negative. Further surgery 09/16/2013 obtained clear margins.  Her subsequent history is as detailed below  INTERVAL HISTORY: The patient was evaluated in the breast clinic 10/09/2013 accompanied by her friend Alicia Vasquez. [name] established herself in my practice today  REVIEW OF SYSTEMS: She tolerated the initial surgery well, but has had more pain with the second in the surgery, for margin clearance. She has had some fatigue, and some soreness, but no bleeding, fever, swelling, or erythema. 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 This keeps her from walking as  much as she would like but in any case she does not exercise on a regular basis. She does a little bit of gardening. Otherwise a detailed review of systems today was noncontributory  PAST MEDICAL HISTORY: Past Medical History  Diagnosis Date  . Allergy   . Hypertension   . Hypothyroid   . Blood transfusion without reported diagnosis   . Pneumonia   . DVT (deep vein thrombosis) in pregnancy   . Breast cancer 07/12/13    invasive mammary carcinoma  . Anxiety   . Arthritis     PAST SURGICAL HISTORY: Past Surgical History  Procedure Laterality Date  . Removal of teeth  04/19/2012    13 teeth removed  . Biospy      female organs  . Dilation and curettage of uterus    . Breast lumpectomy with radioactive seed localization Right 09/09/2013    Procedure: BREAST LUMPECTOMY WITH RADIOACTIVE SEED LOCALIZATION WITH AXILLARY NODE EXCISION;  Surgeon: Alicia Bookbinder, MD;  Location: [facility];  Service: General;  Laterality: Right;  . Re-excision of breast lumpectomy Right 09/24/2013    Procedure: RE-EXCISION OF RIGHT BREAST LUMPECTOMY;  Surgeon: Alicia Bookbinder, MD;  Location: [facility];  Service: General;  Laterality: Right;    FAMILY HISTORY Family History  Problem Relation Age of Onset  . Cancer Brother     prostate  . Heart disease Brother   . Colon cancer Brother    the patient's father died at the age of 27 after an automobile accident. The patient's mother died at the age of 18. She was a Marine scientist here in [place] in the old [facility]. She was infected with polio and was confined  to a wheelchair for a good part of her life. She eventually died of pneumonia. The patient had one brother, who died with prostate cancer. She had no sisters. There is no history of breast or ovarian cancer in the family.  GYNECOLOGIC HISTORY:  Menarche age 47, first live birth age 70, the patient is GX P1. She went through the change of life at age 9. 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 She is widowed. Currently she is staying with her friend Alicia Vasquez, who is a retired Radio producer. The patient's son Alicia Vasquez lives in [place]. He works an Engineer, technical sales. The patient has no grandchildren. She is a Tourist information centre manager but currently attends a [employer] with her friend Alicia Vasquez    ADVANCED DIRECTIVES:    HEALTH MAINTENANCE: History  Substance Use Topics  . Smoking status: Never Smoker   . Smokeless tobacco: Not on file  . Alcohol Use: No     Colonoscopy: Never  PAP:  Bone density: May 2011; lowest T score -0.8  Lipid panel:  Allergies  Allergen Reactions  . Anesthetics, Amide Other (See Comments)    ELEVATE BLOOD PRESSURE  . Benadryl [Diphenhydramine Hcl]     dizziness  . Carbocaine [Mepivacaine Hcl] Other (See Comments)    Elevated blood pressure  . Codeine Nausea Only and Other (See Comments)    dizziness  . Epinephrine Other (See Comments)    Elevated blood pressure  . Sulfa Antibiotics Other (See Comments)    dizziness  . Vicodin [Hydrocodone-Acetaminophen] Nausea Only  . Penicillins Rash    Current Outpatient Prescriptions  Medication Sig Dispense Refill  . acetaminophen (TYLENOL) 500 MG tablet Take 500 mg by mouth every 6 (six) hours as needed for mild pain, fever or headache.      . albuterol (PROVENTIL HFA;VENTOLIN HFA) 108 (90 BASE) MCG/ACT inhaler Inhale 2 puffs into the lungs every 6 (six) hours as needed for wheezing.      Marland Kitchen ipratropium (ATROVENT) 0.03 % nasal spray Place 2 sprays into both nostrils daily as needed for rhinitis.      Marland Kitchen levothyroxine (SYNTHROID) 100 MCG tablet Take 100 mcg by mouth daily.      Marland Kitchen lisinopril (PRINIVIL,ZESTRIL) 10 MG tablet Take 10 mg by mouth daily.      Marland Kitchen oxyCODONE (OXY IR/ROXICODONE) 5 MG immediate release tablet Take 1 tablet (5 mg total) by mouth every 6 (six) hours as needed.  20 tablet  0  . warfarin (COUMADIN) 5  MG tablet Take 5-7.5 mg by mouth daily. Take 10 mg on Monday, Wednesday and Friday and 7.5 mg all other days       No current facility-administered medications for this visit.    OBJECTIVE: Older white woman in no acute distress Filed Vitals:   10/09/13 1337  BP: 150/73  Pulse: 75  Temp: 97.7 F (36.5 C)  Resp: 20     Body mass index is 40.03 kg/(m^2).    ECOG FS:1 - Symptomatic but completely ambulatory  Ocular: Sclerae unicteric, EOMs intact Ear-nose-throat: Oropharynx clear and moist Lymphatic: No cervical or supraclavicular adenopathy Lungs no rales or rhonchi, good excursion bilaterally Heart regular rate and rhythm, no murmur appreciated Abd soft, obese, nontender, positive bowel sounds MSK no focal spinal tenderness, minimal right upper extremity lymphedema Neuro: non-focal, well-oriented, appropriate affect Breasts: The right breast is status post lumpectomy. The incisions are healing nicely. The cosmetic result is good. There  are no skin or nipple changes of concern. No masses are palpated. The right axilla is benign. The left breast is unremarkable   LAB RESULTS:  CMP     Component Value Date/Time   NA 140 09/05/2013 1330   NA 142 07/24/2013 0823   K 4.4 09/05/2013 1330   K 4.1 07/24/2013 0823   CL 103 09/05/2013 1330   CO2 23 09/05/2013 1330   CO2 26 07/24/2013 0823   GLUCOSE 145* 09/05/2013 1330   GLUCOSE 117 07/24/2013 0823   BUN 15 09/05/2013 1330   BUN 12.2 07/24/2013 0823   CREATININE 0.77 09/05/2013 1330   CREATININE 0.8 07/24/2013 0823   CREATININE 0.80 02/13/2013 1810   CALCIUM 10.0 09/05/2013 1330   CALCIUM 10.1 07/24/2013 0823   PROT 6.9 07/24/2013 0823   PROT 7.3 02/13/2013 1810   ALBUMIN 3.9 07/24/2013 0823   ALBUMIN 4.4 02/13/2013 1810   AST 17 07/24/2013 0823   AST 16 02/13/2013 1810   ALT 25 07/24/2013 0823   ALT 21 02/13/2013 1810   ALKPHOS 81 07/24/2013 0823   ALKPHOS 79 02/13/2013 1810   BILITOT 0.40 07/24/2013 0823   BILITOT 0.4 02/13/2013 1810   GFRNONAA 81*  09/05/2013 1330   GFRAA >90 09/05/2013 1330    I No results found for this basename: SPEP, UPEP,  kappa and lambda light chains    Lab Results  Component Value Date   WBC 9.8 09/23/2013   NEUTROABS 6.2 09/23/2013   HGB 14.9 09/23/2013   HCT 45.1 09/23/2013   MCV 94.4 09/23/2013   PLT 267 09/23/2013      Chemistry      Component Value Date/Time   NA 140 09/05/2013 1330   NA 142 07/24/2013 0823   K 4.4 09/05/2013 1330   K 4.1 07/24/2013 0823   CL 103 09/05/2013 1330   CO2 23 09/05/2013 1330   CO2 26 07/24/2013 0823   BUN 15 09/05/2013 1330   BUN 12.2 07/24/2013 0823   CREATININE 0.77 09/05/2013 1330   CREATININE 0.8 07/24/2013 0823   CREATININE 0.80 02/13/2013 1810      Component Value Date/Time   CALCIUM 10.0 09/05/2013 1330   CALCIUM 10.1 07/24/2013 0823   ALKPHOS 81 07/24/2013 0823   ALKPHOS 79 02/13/2013 1810   AST 17 07/24/2013 0823   AST 16 02/13/2013 1810   ALT 25 07/24/2013 0823   ALT 21 02/13/2013 1810   BILITOT 0.40 07/24/2013 0823   BILITOT 0.4 02/13/2013 1810       No results found for this basename: LABCA2    No components found with this basename: [id number]    No results found for this basename: INR,  in the last 168 hours  Urinalysis    Component Value Date/Time   BILIRUBINUR neg 08/20/2013 1322   PROTEINUR neg 08/20/2013 1322   UROBILINOGEN 0.2 08/20/2013 1322   NITRITE neg 08/20/2013 1322   LEUKOCYTESUR Negative 08/20/2013 1322    STUDIES: Mm Breast Surgical Specimen  09/09/2013   CLINICAL DATA:  Personal history of right breast cancer status post surgical excision.  EXAM: SPECIMEN RADIOGRAPH OF THE RIGHT BREAST  COMPARISON:  Previous exam(s)  FINDINGS: Status post excision of the right breast. Radioactive seed, biopsy clip, and massare present and is marked for pathology.  IMPRESSION: Specimen radiograph of the right breast.  No apparent complications   Electronically Signed   By: Abelardo Diesel M.D.   On: 09/09/2013 13:36   Dg Knee Complete 4 Views Right  10/02/2013    CLINICAL DATA:  Knee pain.  EXAM: RIGHT KNEE - COMPLETE 4+ VIEW  COMPARISON:  None.  FINDINGS: There is no fracture. Tricompartmental osteoarthritis is present. No effusion. Prominent marginal osteophyte present in the suprapatellar region. Osteoarthritis is severe in the medial compartment and mild in the lateral compartment. Severe patellofemoral osteoarthritis. Degenerative lateral subluxation of the tibia relative to the femoral condyles.  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   Electronically Signed   By: Dereck Ligas M.D.   On: 10/02/2013 15:20    ASSESSMENT: 73 y.o. [place] woman status post right lumpectomy and sentinel lymph node sampling 09/09/2013 for an mpT1c pN1a, stage IIA invasive ductal carcinoma, estrogen and progesterone receptor both 100% positive with strong staining intensity, MIB-1 of 17% and no HER-2 amplification  (1) additional surgery for margin clearance 09/16/2013 obtained negative margins  (2) Oncotype DX recurrence score of 4 predicts a risk of outside the breast recurrence within 10 years of 7% if the patient's only systemic therapy is tamoxifen for 5 years. It also predicts no benefit from chemotherapy  (3) radiation to follow surgery  (4) anti-estrogens to follow radiation  PLAN: We spent the better part of today's hour-long appointment discussing the biology of breast cancer in general, and the specifics of the patient's tumor in particular. Vaunda understands that, while clear margins were obtained with a second surgery, nevertheless she had a multifocal node positive tumor and she will benefit from adjuvant radiation to optimize her local treatment  As far as systemic therapy is concerned, she would gain minimal benefit from chemotherapy and this is not recommended. On the other hand anti-estrogens will cut in half her risk of distant recurrence and also decrease her risk of local  recurrence of this tumor. Antihistamines also will cut in half the risk of her developing a new breast cancer in either breast.  The patient will be a good candidate for aromatase inhibitors given her normal bone density results. We will discuss this further at her next visit here, which will be in approximately 2 months, after completion of her radiation.  Jameria has a good understanding of the overall plan. She agrees with it. She knows a goal of treatment or cases cure. She will call with any problems that may develop before her next visit here.   Chauncey Cruel, MD   10/09/2013 1:42 PM

## 2013-10-10 ENCOUNTER — Ambulatory Visit (INDEPENDENT_AMBULATORY_CARE_PROVIDER_SITE_OTHER): Payer: Medicare Other | Admitting: Family Medicine

## 2013-10-10 ENCOUNTER — Encounter: Payer: Self-pay | Admitting: Family Medicine

## 2013-10-10 VITALS — BP 116/74 | HR 70 | Temp 97.8°F | Resp 16 | Ht 64.0 in | Wt 231.8 lb

## 2013-10-10 DIAGNOSIS — E039 Hypothyroidism, unspecified: Secondary | ICD-10-CM

## 2013-10-10 DIAGNOSIS — C50919 Malignant neoplasm of unspecified site of unspecified female breast: Secondary | ICD-10-CM | POA: Diagnosis not present

## 2013-10-10 DIAGNOSIS — M1710 Unilateral primary osteoarthritis, unspecified knee: Secondary | ICD-10-CM | POA: Diagnosis not present

## 2013-10-10 DIAGNOSIS — IMO0002 Reserved for concepts with insufficient information to code with codable children: Secondary | ICD-10-CM

## 2013-10-10 DIAGNOSIS — I10 Essential (primary) hypertension: Secondary | ICD-10-CM

## 2013-10-10 LAB — COMPLETE METABOLIC PANEL WITH GFR
ALT: 21 U/L (ref 0–35)
AST: 17 U/L (ref 0–37)
Albumin: 4.1 g/dL (ref 3.5–5.2)
Alkaline Phosphatase: 77 U/L (ref 39–117)
BUN: 15 mg/dL (ref 6–23)
CO2: 26 mEq/L (ref 19–32)
Calcium: 9.7 mg/dL (ref 8.4–10.5)
Chloride: 105 mEq/L (ref 96–112)
Creat: 0.81 mg/dL (ref 0.50–1.10)
GFR, Est African American: 83 mL/min
GFR, Est Non African American: 72 mL/min
Glucose, Bld: 94 mg/dL (ref 70–99)
Potassium: 4.4 mEq/L (ref 3.5–5.3)
Sodium: 139 mEq/L (ref 135–145)
Total Bilirubin: 0.5 mg/dL (ref 0.2–1.2)
Total Protein: 6.4 g/dL (ref 6.0–8.3)

## 2013-10-10 LAB — TSH: TSH: 2.529 u[IU]/mL (ref 0.350–4.500)

## 2013-10-10 NOTE — Addendum Note (Signed)
Addended by: Laureen Abrahams on: 10/10/2013 06:22 PM   Modules accepted: Orders

## 2013-10-10 NOTE — Progress Notes (Signed)
Subjective:  This chart was scribed for Alicia Haber, MD by [name], ED Scribe. This patient was seen in room 25 and the patient's care was started at 10:59 AM.   Patient ID: Alicia Vasquez, female    DOB: Sep 21, 1940, 73 y.o.   MRN: [id number]  Chief Complaint  Patient presents with   Hypertension   thyroid check    HPI HPI Comments: Alicia Vasquez is a 73 y.o. female who presents to the [facility] for hypertension, thyroid check, and a 6 month follow up.   Pt states that she may have displaced her right knee following her knee surgery in April. She was seen at the [facility] office and referred to an orthopedist. Pt states that she was given a small brace to stabilize her knee. She was also given diclofenac Gel. Advised pt that she should continue with the gel for one week if she does not see any improvements after that she should return to the office for another prescription.   Pt has a history of breast cancer of upper-outer quadrant of right breast. She states that 3 lymph nodes under her right arm were taken out but no cancer was found in them. Pt states that she had some mild associated soreness in her right arm.  She states that antihistamines would aid in not having reoccurrence of the breast cancers. Pt states that her oncologist said that chemotherapy will not be beneficial to her.   She is requesting a blood pressure and thyroid check. Pt denies any abdominal pain. She states that she's tried to stay away from oily foods.   Pt states that yesterdays her blood pressure was a little high. However, she's not sure if the increased BP was due to the stress that she was going through yesterday.   She is still taking Coumadin.  Advised pt to return to the office if the right knee pain does not resolve.   Patient Active Problem List   Diagnosis Date Noted   Depression 07/18/2013   Breast cancer of upper-outer quadrant of right female breast 07/15/2013    Encounter for therapeutic drug monitoring 07/05/2013   Overactive bladder 01/30/2013   DVT (deep venous thrombosis), left 12/19/2012   HTN (hypertension) 09/27/2011   Hearing loss 09/27/2011   Chest wall pain 09/27/2011   Seasonal allergies 09/27/2011   SOB (shortness of breath), related to deconditioning 08/26/2011   Hypothyroid 08/26/2011   Past Medical History  Diagnosis Date   Allergy    Hypertension    Hypothyroid    Blood transfusion without reported diagnosis    Pneumonia    DVT (deep vein thrombosis) in pregnancy    Breast cancer 07/12/13    invasive mammary carcinoma   Anxiety    Arthritis    Past Surgical History  Procedure Laterality Date   Removal of teeth  04/19/2012    13 teeth removed   Biospy      female organs   Dilation and curettage of uterus     Breast lumpectomy with radioactive seed localization Right 09/09/2013    Procedure: BREAST LUMPECTOMY WITH RADIOACTIVE SEED LOCALIZATION WITH AXILLARY NODE EXCISION;  Surgeon: [name], MD;  Location: [facility];  Service: General;  Laterality: Right;   Re-excision of breast lumpectomy Right 09/24/2013    Procedure: RE-EXCISION OF RIGHT BREAST LUMPECTOMY;  Surgeon: [name], MD;  Location: [facility];  Service: General;  Laterality: Right;   Allergies  Allergen Reactions   Anesthetics, Amide Other (See Comments)    ELEVATE BLOOD PRESSURE   Benadryl [Diphenhydramine Hcl]     dizziness   Carbocaine [Mepivacaine Hcl] Other (See Comments)    Elevated blood pressure   Codeine Nausea Only and Other (See Comments)    dizziness   Epinephrine Other (See Comments)    Elevated blood pressure   Sulfa Antibiotics Other (See Comments)    dizziness   Vicodin [Hydrocodone-Acetaminophen] Nausea Only   Penicillins Rash   Prior to Admission medications   Medication Sig Start Date End Date Taking? Authorizing Provider  acetaminophen (TYLENOL) 500  MG tablet Take 500 mg by mouth every 6 (six) hours as needed for mild pain, fever or headache.    Historical Provider, MD  albuterol (PROVENTIL HFA;VENTOLIN HFA) 108 (90 BASE) MCG/ACT inhaler Inhale 2 puffs into the lungs every 6 (six) hours as needed for wheezing. 10/17/11   Hayden Rasmussen, MD  ipratropium (ATROVENT) 0.03 % nasal spray Place 2 sprays into both nostrils daily as needed for rhinitis. 08/01/13   Chelle Janalee Dane, PA-C  levothyroxine (SYNTHROID) 100 MCG tablet Take 100 mcg by mouth daily. 01/17/13   Alicia Haber, MD  lisinopril (PRINIVIL,ZESTRIL) 10 MG tablet Take 10 mg by mouth daily.    Historical Provider, MD  oxyCODONE (OXY IR/ROXICODONE) 5 MG immediate release tablet Take 1 tablet (5 mg total) by mouth every 6 (six) hours as needed. 09/24/13   [name], MD  warfarin (COUMADIN) 5 MG tablet Take 5-7.5 mg by mouth daily. Take 10 mg on Monday, Wednesday and Friday and 7.5 mg all other days 06/17/13   Webb Silversmith, NP   History   Social History   Marital Status: Widowed    Spouse Name: N/A    Number of Children: 1   Years of Education: N/A   Occupational History   Not on file.   Social History Main Topics   Smoking status: Never Smoker    Smokeless tobacco: Not on file   Alcohol Use: No   Drug Use: No   Sexual Activity: No   Other Topics Concern   Not on file   Social History Narrative   Exercise yard work   Review of Systems A complete 10 system review of systems was obtained and all systems are negative except as noted in the HPI and PMH.   Objective:   Physical Exam  Nursing note and vitals reviewed. Constitutional: She is oriented to person, place, and time. She appears well-developed and well-nourished. No distress.  HENT:  Head: Normocephalic and atraumatic.  Right Ear: External ear normal.  Left Ear: External ear normal.  Nose: Nose normal.  Mouth/Throat: Oropharynx is clear and moist.  Eyes: Conjunctivae and EOM are normal. Pupils are  equal, round, and reactive to light. Right eye exhibits no discharge. Left eye exhibits no discharge. No scleral icterus.  Neck: Neck supple. No JVD present. No thyromegaly present.  Cardiovascular: Normal rate, regular rhythm and normal heart sounds.  Exam reveals no gallop and no friction rub.   No murmur heard. Pulmonary/Chest: Effort normal and breath sounds normal. No stridor. No respiratory distress. She has no wheezes. She has no rales. She exhibits no tenderness.  Abdominal: Soft. Bowel sounds are normal. She exhibits no distension. There is no tenderness.  Musculoskeletal: She exhibits no edema and no tenderness.  Lymphadenopathy:    She has no cervical adenopathy.  Neurological: She is alert and oriented to person, place, and time. No  cranial nerve deficit.  Skin: Skin is warm and dry.  Psychiatric: She has a normal mood and affect. Her behavior is normal. Thought content normal.   Wt Readings from Last 3 Encounters:  10/10/13 231 lb 12.8 oz (105.144 kg)  10/09/13 233 lb 4.8 oz (105.824 kg)  10/02/13 233 lb (105.688 kg)   BP Readings from Last 3 Encounters:  10/10/13 116/74  10/09/13 150/73  10/02/13 120/80   Filed Vitals:   10/10/13 1056  BP: 116/74  Pulse: 70  Temp: 97.8 F (36.6 C)  TempSrc: Oral  Resp: 16  Height: 5\' 4"  (1.626 m)  Weight: 231 lb 12.8 oz (105.144 kg)  SpO2: 95%    Assessment & Plan:    Hypothyroid - Plan: TSH  Breast cancer  Hypertension - Plan: COMPLETE METABOLIC PANEL WITH GFR  Signed, Alicia Haber, MD    I personally performed the services described in this documentation, which was scribed in my presence. The recorded information has been reviewed and is accurate.

## 2013-10-11 ENCOUNTER — Telehealth (INDEPENDENT_AMBULATORY_CARE_PROVIDER_SITE_OTHER): Payer: Self-pay

## 2013-10-11 ENCOUNTER — Encounter: Payer: Self-pay | Admitting: *Deleted

## 2013-10-11 NOTE — Telephone Encounter (Signed)
Pt s/p right breast mastectomy on 09/24/13. Pt was helping a friend that had fell yesterday  and last night she noticed a knot the size of a fifty cent piece pop up on her incision area under the right breast. She denies any fevers or chills at this time. The area has become more swollen, no redness or bruising noted at this time. Pt states that the area has now become more tender. Pt is taking coumadin. Pt concerned would like direction of what she should do at this point. Informed pt that I would send this to Dr Donne Hazel for review. Pt can be reached at [phone].

## 2013-10-11 NOTE — Telephone Encounter (Signed)
Called pt to inform her that Dr Donne Hazel would like to see her on Monday. Pt states that she has been placing ice on the area and she has not noticed the area getting any worse. Pt verbalized understanding and agrees with POC.

## 2013-10-11 NOTE — Telephone Encounter (Signed)
Sounds like would be ok to look at Monday. If she is concerned could come to urg today or if getting worse.

## 2013-10-14 ENCOUNTER — Encounter (INDEPENDENT_AMBULATORY_CARE_PROVIDER_SITE_OTHER): Payer: Self-pay | Admitting: General Surgery

## 2013-10-14 ENCOUNTER — Ambulatory Visit (INDEPENDENT_AMBULATORY_CARE_PROVIDER_SITE_OTHER): Payer: Medicare Other | Admitting: General Surgery

## 2013-10-14 VITALS — BP 128/80 | HR 75 | Temp 97.1°F | Ht 63.0 in | Wt 231.0 lb

## 2013-10-14 DIAGNOSIS — Z09 Encounter for follow-up examination after completed treatment for conditions other than malignant neoplasm: Secondary | ICD-10-CM

## 2013-10-14 NOTE — Progress Notes (Signed)
Subjective:     Patient ID: Alicia Vasquez, female   DOB: 06-Nov-1940, 73 y.o.   MRN: [id number]  HPI This is a 73 year old female who is status post a right lumpectomy with reexcision for margins as well as a targeted node dissection. She had been doing well. She coughed someone who was falling last week and noticed a knot that appeared at her incision. There's been no drainage. There is no infection. This is that she got better since then. She wanted to come in today to have it looked at before she is evaluated by radiation oncology on Wednesday.  Review of Systems     Objective:   Physical Exam Healing right breast incision with what looks like moderate hematoma, no infection     Assessment:     Right breast hematoma     Plan:     I think this will just heel. It is that she or he got better since that happened. She is going to be careful. She's will continue to apply ice. She where a tighter fitting bra than the one she has on now also. I told her that should this should just resolve conservatively she was fine to see radiation oncology.

## 2013-10-23 ENCOUNTER — Other Ambulatory Visit: Payer: Self-pay | Admitting: Internal Medicine

## 2013-10-23 NOTE — Progress Notes (Signed)
Location of Breast Cancer:Right upper-outer breast. Grade 1 invasive mammary ca.  Histology per Pathology Report: 09/24/13 Diagnosis 1. 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 - SEE COMMENT. 2. Breast, excision, Right, superior medial margin - LOBULAR NEOPLASIA (LOBULAR CARCINOMA IN SITU). 4/13/JOHN PATRICK MD Pathologist, Electronic Signature ( Signed 09/19/2013) FINAL DIAGNOSIS Diagnosis 1. Breast, lumpectomy, Right - MULTIFOCAL INVASIVE DUCTAL CARCINOMA, SEE COMMENT. - INVASIVE TUMOR IS PRESENT AT MEDIAL MARGIN. - DUCTAL CARCINOMA IN SITU. - PREVIOUS BIOPSY SITE. - SEE TUMOR SYNOPTIC TEMPLATE BELOW. 2. Lymph node, biopsy, Right axilla with radioactive - see description - ONE LYMPH NODE, POSITIVE FOR METASTATIC MAMMARY CARCINOMA (1/1) - TUMOR DEPOSIT IS 2.4 CM. - POSITIVE FOR EXTRACAPSULAR TUMOR EXTENSION. 3. Lymph nodes, regional resection, Right axillary - THREE LYMPH NODES NEGATIVE FOR TUMOR. (0/3) 1 of2015   Receptor Status: ER(+), PR (+), Her2-neu (-)  Did patient present with symptoms (if so, please note symptoms) or was this found on screening mammography?Initially seen on ct scan for work-up for  pulmonary emboli seconday to deep vein thrombosis in left leg and then confirmed via biopsy.  Past/Anticipated interventions by surgeon, if NVV:YXAJL lumpectomy and sentinel lymph node sampling 09/09/13.  Past/Anticipated interventions by medical oncology, if any: Chemotherapy is not recommended.Follow up with Dr.magrinat in 2 months for consideration of anti-estrogen. Oncotype Score 4  Lymphedema issues, if UNG:BMBOMQTT and numbness from right axilla to elbow.  Pain issues, if CNG:FREVQ knee and right breast incision.  SAFETY ISSUES:  Prior radiation? No  Pacemaker/ICD? No  Possible current pregnancy?No lmp age 7  Is the patient on methotrexate?No  Current Complaints / other  details:Menarche age 52, first live birth age 18.GXP1.Last menstrual cycle age 14.No hormonal replacement therapy.Retired Radio broadcast assistant.   Arlyss Repress, RN 10/23/2013,2:19 PM

## 2013-10-24 ENCOUNTER — Ambulatory Visit
Admission: RE | Admit: 2013-10-24 | Discharge: 2013-10-24 | Disposition: A | Discharge status: Home / Self Care | Payer: Medicare Other | Source: Ambulatory Visit | Attending: Radiation Oncology | Admitting: Radiation Oncology

## 2013-10-24 VITALS — BP 123/58 | HR 73 | Temp 97.7°F | Wt 230.7 lb

## 2013-10-24 DIAGNOSIS — C50411 Malignant neoplasm of upper-outer quadrant of right female breast: Secondary | ICD-10-CM

## 2013-10-24 DIAGNOSIS — R609 Edema, unspecified: Secondary | ICD-10-CM | POA: Diagnosis not present

## 2013-10-24 DIAGNOSIS — Z7901 Long term (current) use of anticoagulants: Secondary | ICD-10-CM | POA: Diagnosis not present

## 2013-10-24 DIAGNOSIS — M79609 Pain in unspecified limb: Secondary | ICD-10-CM | POA: Diagnosis not present

## 2013-10-24 DIAGNOSIS — R209 Unspecified disturbances of skin sensation: Secondary | ICD-10-CM | POA: Insufficient documentation

## 2013-10-24 DIAGNOSIS — C50419 Malignant neoplasm of upper-outer quadrant of unspecified female breast: Secondary | ICD-10-CM | POA: Diagnosis not present

## 2013-10-24 DIAGNOSIS — Z170 Estrogen receptor positive status [ER+]: Secondary | ICD-10-CM | POA: Insufficient documentation

## 2013-10-24 DIAGNOSIS — Z510 Encounter for antineoplastic radiation therapy: Secondary | ICD-10-CM | POA: Insufficient documentation

## 2013-10-24 NOTE — Progress Notes (Signed)
Department of Radiation Oncology  Phone:  [phone] Fax:        [phone]   Name: Alicia Vasquez MRN: [id number]  DOB: Mar 12, 1941  Date: 10/24/2013  Follow Up Visit Note  Diagnosis: T1N1 right breast cancer  Interval History: Alicia Vasquez presents today for routine followup.  She had her lumpectomy on 4/13. She was found to have multifocal disease with a 1.8 and 1.2 cm masses. 1/4 lymph nodes were positive with tumor measuring 2.4 cm and including extracapsular extension. The tumor was ER and PR positive. She did have a positive lateral margin and went back for re-excision. This specimen showed ADH and LCIS. She had a low oncotype score and no chemotherapy was recommended.  She is ready to begin radiation. She is accompanied by a friend today. She still has soreness and swelling around the lymph node incision site. She has some difficulty raising her arm and numbness under her right arm. She has significant knee pain for which she has been evaluated by orthopedics.   Allergies:  Allergies  Allergen Reactions  . Anesthetics, Amide Other (See Comments)    ELEVATE BLOOD PRESSURE  . Benadryl [Diphenhydramine Hcl]     dizziness  . Carbocaine [Mepivacaine Hcl] Other (See Comments)    Elevated blood pressure  . Codeine Nausea Only and Other (See Comments)    dizziness  . Epinephrine Other (See Comments)    Elevated blood pressure  . Sulfa Antibiotics Other (See Comments)    dizziness  . Vicodin [Hydrocodone-Acetaminophen] Nausea Only  . Penicillins Rash    Medications:  Current Outpatient Prescriptions  Medication Sig Dispense Refill  . acetaminophen (TYLENOL) 500 MG tablet Take 500 mg by mouth every 6 (six) hours as needed for mild pain, fever or headache.      . levothyroxine (SYNTHROID) 100 MCG tablet Take 100 mcg by mouth daily.      Marland Kitchen lisinopril (PRINIVIL,ZESTRIL) 10 MG tablet Take 10 mg by mouth daily.      Marland Kitchen oxyCODONE (OXY IR/ROXICODONE) 5 MG immediate release tablet Take 1  tablet (5 mg total) by mouth every 6 (six) hours as needed.  20 tablet  0  . warfarin (COUMADIN) 5 MG tablet Take 5-7.5 mg by mouth daily. Take 10 mg on Monday, Wednesday and Friday and 7.5 mg all other days       No current facility-administered medications for this encounter.    Physical Exam:  Filed Vitals:   10/24/13 1123  BP: 123/58  Pulse: 73  Temp: 97.7 F (36.5 C)  Weight: 230 lb 11.2 oz (104.645 kg)   appears frail. Short of breath with minimal exertion. Notable sroma in upper outer quadrant. No sings of infection.   IMPRESSION: Alicia Vasquez is a 73 y.o. 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   PLAN:  I spoke to the patient today regarding her diagnosis and options for treatment. . We discussed the role of radiation in decreasing local failures in patients who undergo lumpectomy. We discussed the treatment of her axilla and supraclavicular lymph nodes due to her limited node dissection.  We discussed the process of simulation and the placement tattoos. We discussed 6 weeks of treatment as an outpatient. We discussed the possibility of asymptomatic lung damage. We discussed the low likelihood of secondary malignancies. We discussed the possible side effects including but not limited to skin redness, fatigue, permanent skin darkening, and breast swelling. I would like to give  her a little bit more time to heal up and have scheduled her for simulation on June 9th.  She has signed informed consent and will be seeing Dr. Donne Hazel about that time as well.      Thea Silversmith, MD

## 2013-10-24 NOTE — Progress Notes (Signed)
Please see the Nurse Progress Note in the MD Initial Consult Encounter for this patient. 

## 2013-10-25 NOTE — Telephone Encounter (Signed)
It says her PCP has changed. She should get this from her new provider if she switched

## 2013-10-25 NOTE — Telephone Encounter (Signed)
You are listed as this patient's PCP--please advise if you authorize refills on Warfarin

## 2013-10-25 NOTE — Telephone Encounter (Signed)
i saw that pt has not seen you in a while but has been going to [facility]--please advise if okay to fill--last filled 11/14 and refilled 06/13/2013 under your name

## 2013-10-29 ENCOUNTER — Ambulatory Visit (INDEPENDENT_AMBULATORY_CARE_PROVIDER_SITE_OTHER): Payer: Medicare Other | Admitting: General Practice

## 2013-10-29 DIAGNOSIS — I82402 Acute embolism and thrombosis of unspecified deep veins of left lower extremity: Secondary | ICD-10-CM

## 2013-10-29 DIAGNOSIS — I82409 Acute embolism and thrombosis of unspecified deep veins of unspecified lower extremity: Secondary | ICD-10-CM | POA: Diagnosis not present

## 2013-10-29 DIAGNOSIS — Z5181 Encounter for therapeutic drug level monitoring: Secondary | ICD-10-CM

## 2013-10-29 LAB — POCT INR: INR: 3.5

## 2013-10-29 NOTE — Progress Notes (Signed)
Pre visit review using our clinic review tool, if applicable. No additional management support is needed unless otherwise documented below in the visit note. 

## 2013-11-05 ENCOUNTER — Ambulatory Visit
Admission: RE | Admit: 2013-11-05 | Discharge: 2013-11-05 | Disposition: A | Discharge status: Home / Self Care | Payer: Medicare Other | Source: Ambulatory Visit | Attending: Radiation Oncology | Admitting: Radiation Oncology

## 2013-11-05 ENCOUNTER — Ambulatory Visit (INDEPENDENT_AMBULATORY_CARE_PROVIDER_SITE_OTHER): Payer: Medicare Other | Admitting: General Surgery

## 2013-11-05 ENCOUNTER — Encounter (INDEPENDENT_AMBULATORY_CARE_PROVIDER_SITE_OTHER): Payer: Self-pay | Admitting: General Surgery

## 2013-11-05 VITALS — BP 142/80 | HR 78 | Resp 18 | Ht 64.0 in | Wt 229.0 lb

## 2013-11-05 DIAGNOSIS — Z09 Encounter for follow-up examination after completed treatment for conditions other than malignant neoplasm: Secondary | ICD-10-CM

## 2013-11-05 DIAGNOSIS — Z170 Estrogen receptor positive status [ER+]: Secondary | ICD-10-CM | POA: Diagnosis not present

## 2013-11-05 DIAGNOSIS — R609 Edema, unspecified: Secondary | ICD-10-CM | POA: Diagnosis not present

## 2013-11-05 DIAGNOSIS — C50419 Malignant neoplasm of upper-outer quadrant of unspecified female breast: Secondary | ICD-10-CM | POA: Diagnosis not present

## 2013-11-05 DIAGNOSIS — R209 Unspecified disturbances of skin sensation: Secondary | ICD-10-CM | POA: Diagnosis not present

## 2013-11-05 DIAGNOSIS — C50411 Malignant neoplasm of upper-outer quadrant of right female breast: Secondary | ICD-10-CM

## 2013-11-05 DIAGNOSIS — Z510 Encounter for antineoplastic radiation therapy: Secondary | ICD-10-CM | POA: Diagnosis not present

## 2013-11-05 DIAGNOSIS — M79609 Pain in unspecified limb: Secondary | ICD-10-CM | POA: Diagnosis not present

## 2013-11-05 MED ORDER — OXYCODONE HCL 5 MG PO TABS: 5.0000 mg | ORAL_TABLET | Freq: Four times a day (QID) | ORAL | Status: DC | PRN

## 2013-11-05 NOTE — Progress Notes (Signed)
Name: [name]   MRN: [id number]  Date:  11/05/2013  DOB: [birth year]  Status:outpatient    DIAGNOSIS: Breast cancer.  CONSENT VERIFIED: yes   SET UP: Patient is setup supine   IMMOBILIZATION:  The following immobilization was used:Custom Moldable Pillow, breast board.   NARRATIVE: [name] was brought to the [facility].  Identity was confirmed.  All relevant records and images related to the planned course of therapy were reviewed.  Then, the patient was positioned in a stable reproducible clinical set-up for radiation therapy.  Wires were placed to delineate the clinical extent of breast tissue. A wire was placed on the scar as well.  CT images were obtained.  An isocenter was placed. Skin markings were placed.  The CT images were loaded into the planning software where the target and avoidance structures were contoured.  The radiation prescription was entered and confirmed. The patient was discharged in stable condition and tolerated simulation well.    TREATMENT PLANNING NOTE:  Treatment planning then occurred. I have requested : MLC's, isodose plan, basic dose calculation  I personally designed and supervised the construction of 5 medically necessary complex treatment devices for the protection of critical normal structures including the lungs and contralateral breast as well as the immobilization device which is necessary for set up certainty.   3D simulation was performed which includes creation and analysis of a dose volume histogram of the heart, lungs and lumpectomy cavity.

## 2013-11-05 NOTE — Progress Notes (Signed)
Subjective:     Patient ID: Alicia Vasquez, female   DOB: 25-Dec-1940, 73 y.o.   MRN: [id number]  HPI  This is a 73 year old female who is status post a right lumpectomy with reexcision for margins as well as a targeted node dissection. She noted a hematoma last time I saw her after she caught someone who was falling. She returns today doing better. She is due to be simulated later today. Her knee still hurts. Her shoulder is still bothering her but better  Review of Systems     Objective:   Physical Exam Healing right breast incision with resolving hematoma after injury, no infection    Assessment:     S/p lump/node excision     Plan:     I think this will just resolve over time and she can begin radiation. I did give her info on [facility] PT class and exercises again. I will see her back. She will need med onc follow up after radiation is complete.

## 2013-11-12 ENCOUNTER — Ambulatory Visit: Payer: Medicare Other | Attending: Oncology | Admitting: Physical Therapy

## 2013-11-12 ENCOUNTER — Telehealth: Payer: Self-pay | Admitting: *Deleted

## 2013-11-12 DIAGNOSIS — C50919 Malignant neoplasm of unspecified site of unspecified female breast: Secondary | ICD-10-CM | POA: Diagnosis not present

## 2013-11-12 DIAGNOSIS — IMO0001 Reserved for inherently not codable concepts without codable children: Secondary | ICD-10-CM | POA: Insufficient documentation

## 2013-11-12 DIAGNOSIS — M24519 Contracture, unspecified shoulder: Secondary | ICD-10-CM | POA: Insufficient documentation

## 2013-11-12 NOTE — Telephone Encounter (Signed)
Spoke with patient and rescheduled and confirmed her appointment to after she finishes radiation to 01/30/14 at 1130 for labs and 12N with Dr. Jana Hakim.

## 2013-11-13 ENCOUNTER — Ambulatory Visit: Payer: Medicare Other

## 2013-11-14 ENCOUNTER — Ambulatory Visit: Payer: Medicare Other | Admitting: Physical Therapy

## 2013-11-14 DIAGNOSIS — M24519 Contracture, unspecified shoulder: Secondary | ICD-10-CM | POA: Diagnosis not present

## 2013-11-14 DIAGNOSIS — C50919 Malignant neoplasm of unspecified site of unspecified female breast: Secondary | ICD-10-CM | POA: Diagnosis not present

## 2013-11-14 DIAGNOSIS — IMO0001 Reserved for inherently not codable concepts without codable children: Secondary | ICD-10-CM | POA: Diagnosis not present

## 2013-11-15 DIAGNOSIS — R209 Unspecified disturbances of skin sensation: Secondary | ICD-10-CM | POA: Diagnosis not present

## 2013-11-15 DIAGNOSIS — C50419 Malignant neoplasm of upper-outer quadrant of unspecified female breast: Secondary | ICD-10-CM | POA: Diagnosis not present

## 2013-11-15 DIAGNOSIS — M79609 Pain in unspecified limb: Secondary | ICD-10-CM | POA: Diagnosis not present

## 2013-11-15 DIAGNOSIS — R609 Edema, unspecified: Secondary | ICD-10-CM | POA: Diagnosis not present

## 2013-11-15 DIAGNOSIS — Z510 Encounter for antineoplastic radiation therapy: Secondary | ICD-10-CM | POA: Diagnosis not present

## 2013-11-15 DIAGNOSIS — Z170 Estrogen receptor positive status [ER+]: Secondary | ICD-10-CM | POA: Diagnosis not present

## 2013-11-18 ENCOUNTER — Ambulatory Visit: Payer: Medicare Other | Admitting: Physical Therapy

## 2013-11-18 DIAGNOSIS — M24519 Contracture, unspecified shoulder: Secondary | ICD-10-CM | POA: Diagnosis not present

## 2013-11-18 DIAGNOSIS — IMO0001 Reserved for inherently not codable concepts without codable children: Secondary | ICD-10-CM | POA: Diagnosis not present

## 2013-11-18 DIAGNOSIS — C50919 Malignant neoplasm of unspecified site of unspecified female breast: Secondary | ICD-10-CM | POA: Diagnosis not present

## 2013-11-19 ENCOUNTER — Ambulatory Visit
Admission: RE | Admit: 2013-11-19 | Discharge: 2013-11-19 | Disposition: A | Discharge status: Home / Self Care | Payer: Medicare Other | Source: Ambulatory Visit | Attending: Radiation Oncology | Admitting: Radiation Oncology

## 2013-11-19 DIAGNOSIS — M79609 Pain in unspecified limb: Secondary | ICD-10-CM | POA: Diagnosis not present

## 2013-11-19 DIAGNOSIS — Z170 Estrogen receptor positive status [ER+]: Secondary | ICD-10-CM | POA: Diagnosis not present

## 2013-11-19 DIAGNOSIS — C50419 Malignant neoplasm of upper-outer quadrant of unspecified female breast: Secondary | ICD-10-CM | POA: Diagnosis not present

## 2013-11-19 DIAGNOSIS — Z510 Encounter for antineoplastic radiation therapy: Secondary | ICD-10-CM | POA: Diagnosis not present

## 2013-11-19 DIAGNOSIS — C50411 Malignant neoplasm of upper-outer quadrant of right female breast: Secondary | ICD-10-CM

## 2013-11-19 DIAGNOSIS — R609 Edema, unspecified: Secondary | ICD-10-CM | POA: Diagnosis not present

## 2013-11-19 DIAGNOSIS — R209 Unspecified disturbances of skin sensation: Secondary | ICD-10-CM | POA: Diagnosis not present

## 2013-11-19 NOTE — Progress Notes (Signed)
Radiation Oncology         [phone] ________________________________  Name: Alicia Vasquez      MRN: [id number]          Date: 6/915              DOB: Jan 24, 1941  Optical Surface Tracking Plan:  Since intensity modulated radiotherapy (IMRT) and 3D conformal radiation treatment methods are predicated on accurate and precise positioning for treatment, intrafraction motion monitoring is medically necessary to ensure accurate and safe treatment delivery.  The ability to quantify intrafraction motion without excessive ionizing radiation dose can only be performed with optical surface tracking. Accordingly, surface imaging offers the opportunity to obtain 3D measurements of patient position throughout IMRT and 3D treatments without excessive radiation exposure.  I am ordering optical surface tracking for this patient's upcoming course of radiotherapy. ________________________________ Signature   Reference:   Ursula Alert, J, et al. Surface imaging-based analysis of intrafraction motion for breast radiotherapy patients.Journal of [place], n. 6, nov. 2014. ISSN 25003704.   Available at: <http://www.jacmp.org/index.php/jacmp/article/view/4957>.

## 2013-11-19 NOTE — Progress Notes (Signed)
  Radiation Oncology         [phone] ________________________________  Name: Alicia Vasquez MRN: [id number]  Date: 11/19/2013  DOB: 21-Jul-1940  Simulation Verification Note  Status: outpatient  NARRATIVE: The patient was brought to the treatment unit and placed in the planned treatment position. The clinical setup was verified. Then port films were obtained and uploaded to the radiation oncology medical record software.  The treatment beams were carefully compared against the planned radiation fields. The position location and shape of the radiation fields was reviewed. The targeted volume of tissue appears appropriately covered by the radiation beams. Organs at risk appear to be excluded as planned.  Based on my personal review, I approved the simulation verification. The patient's treatment will proceed as planned.  ------------------------------------------------  Thea Silversmith, MD

## 2013-11-20 ENCOUNTER — Ambulatory Visit: Payer: Medicare Other

## 2013-11-20 ENCOUNTER — Ambulatory Visit: Payer: Medicare Other | Admitting: Physical Therapy

## 2013-11-20 ENCOUNTER — Encounter: Payer: Medicare Other | Admitting: Physical Therapy

## 2013-11-20 ENCOUNTER — Ambulatory Visit (INDEPENDENT_AMBULATORY_CARE_PROVIDER_SITE_OTHER): Payer: Medicare Other | Admitting: General Practice

## 2013-11-20 DIAGNOSIS — Z5181 Encounter for therapeutic drug level monitoring: Secondary | ICD-10-CM

## 2013-11-20 DIAGNOSIS — IMO0001 Reserved for inherently not codable concepts without codable children: Secondary | ICD-10-CM | POA: Diagnosis not present

## 2013-11-20 DIAGNOSIS — I82409 Acute embolism and thrombosis of unspecified deep veins of unspecified lower extremity: Secondary | ICD-10-CM | POA: Diagnosis not present

## 2013-11-20 DIAGNOSIS — M24519 Contracture, unspecified shoulder: Secondary | ICD-10-CM | POA: Diagnosis not present

## 2013-11-20 DIAGNOSIS — C50919 Malignant neoplasm of unspecified site of unspecified female breast: Secondary | ICD-10-CM | POA: Diagnosis not present

## 2013-11-20 DIAGNOSIS — I82402 Acute embolism and thrombosis of unspecified deep veins of left lower extremity: Secondary | ICD-10-CM

## 2013-11-20 LAB — POCT INR: INR: 3.3

## 2013-11-20 NOTE — Progress Notes (Signed)
Pre visit review using our clinic review tool, if applicable. No additional management support is needed unless otherwise documented below in the visit note. 

## 2013-11-21 ENCOUNTER — Ambulatory Visit
Admission: RE | Admit: 2013-11-21 | Discharge: 2013-11-21 | Disposition: A | Discharge status: Home / Self Care | Payer: Medicare Other | Source: Ambulatory Visit | Attending: Radiation Oncology | Admitting: Radiation Oncology

## 2013-11-21 DIAGNOSIS — C50419 Malignant neoplasm of upper-outer quadrant of unspecified female breast: Secondary | ICD-10-CM | POA: Diagnosis not present

## 2013-11-21 DIAGNOSIS — Z510 Encounter for antineoplastic radiation therapy: Secondary | ICD-10-CM | POA: Diagnosis not present

## 2013-11-21 DIAGNOSIS — R609 Edema, unspecified: Secondary | ICD-10-CM | POA: Diagnosis not present

## 2013-11-21 DIAGNOSIS — M79609 Pain in unspecified limb: Secondary | ICD-10-CM | POA: Diagnosis not present

## 2013-11-21 DIAGNOSIS — R209 Unspecified disturbances of skin sensation: Secondary | ICD-10-CM | POA: Diagnosis not present

## 2013-11-21 DIAGNOSIS — Z170 Estrogen receptor positive status [ER+]: Secondary | ICD-10-CM | POA: Diagnosis not present

## 2013-11-22 ENCOUNTER — Ambulatory Visit
Admission: RE | Admit: 2013-11-22 | Discharge: 2013-11-22 | Disposition: A | Discharge status: Home / Self Care | Payer: Medicare Other | Source: Ambulatory Visit | Attending: Radiation Oncology | Admitting: Radiation Oncology

## 2013-11-22 DIAGNOSIS — Z510 Encounter for antineoplastic radiation therapy: Secondary | ICD-10-CM | POA: Diagnosis not present

## 2013-11-22 DIAGNOSIS — R209 Unspecified disturbances of skin sensation: Secondary | ICD-10-CM | POA: Diagnosis not present

## 2013-11-22 DIAGNOSIS — M79609 Pain in unspecified limb: Secondary | ICD-10-CM | POA: Diagnosis not present

## 2013-11-22 DIAGNOSIS — Z170 Estrogen receptor positive status [ER+]: Secondary | ICD-10-CM | POA: Diagnosis not present

## 2013-11-22 DIAGNOSIS — R609 Edema, unspecified: Secondary | ICD-10-CM | POA: Diagnosis not present

## 2013-11-22 DIAGNOSIS — C50419 Malignant neoplasm of upper-outer quadrant of unspecified female breast: Secondary | ICD-10-CM | POA: Diagnosis not present

## 2013-11-25 ENCOUNTER — Encounter: Payer: Medicare Other | Admitting: Physical Therapy

## 2013-11-25 ENCOUNTER — Ambulatory Visit
Admission: RE | Admit: 2013-11-25 | Discharge: 2013-11-25 | Disposition: A | Discharge status: Home / Self Care | Payer: Medicare Other | Source: Ambulatory Visit | Attending: Radiation Oncology | Admitting: Radiation Oncology

## 2013-11-25 DIAGNOSIS — Z170 Estrogen receptor positive status [ER+]: Secondary | ICD-10-CM | POA: Diagnosis not present

## 2013-11-25 DIAGNOSIS — R209 Unspecified disturbances of skin sensation: Secondary | ICD-10-CM | POA: Diagnosis not present

## 2013-11-25 DIAGNOSIS — M79609 Pain in unspecified limb: Secondary | ICD-10-CM | POA: Diagnosis not present

## 2013-11-25 DIAGNOSIS — R609 Edema, unspecified: Secondary | ICD-10-CM | POA: Diagnosis not present

## 2013-11-25 DIAGNOSIS — Z510 Encounter for antineoplastic radiation therapy: Secondary | ICD-10-CM | POA: Diagnosis not present

## 2013-11-25 DIAGNOSIS — C50419 Malignant neoplasm of upper-outer quadrant of unspecified female breast: Secondary | ICD-10-CM | POA: Diagnosis not present

## 2013-11-26 ENCOUNTER — Ambulatory Visit
Admission: RE | Admit: 2013-11-26 | Discharge: 2013-11-26 | Disposition: A | Discharge status: Home / Self Care | Payer: Medicare Other | Source: Ambulatory Visit | Attending: Radiation Oncology | Admitting: Radiation Oncology

## 2013-11-26 ENCOUNTER — Encounter: Payer: Self-pay | Admitting: Radiation Oncology

## 2013-11-26 ENCOUNTER — Ambulatory Visit: Payer: Medicare Other | Admitting: Physical Therapy

## 2013-11-26 VITALS — BP 128/46 | HR 76 | Temp 97.9°F | Resp 20 | Wt 232.9 lb

## 2013-11-26 DIAGNOSIS — R609 Edema, unspecified: Secondary | ICD-10-CM | POA: Diagnosis not present

## 2013-11-26 DIAGNOSIS — C50919 Malignant neoplasm of unspecified site of unspecified female breast: Secondary | ICD-10-CM | POA: Diagnosis not present

## 2013-11-26 DIAGNOSIS — C50419 Malignant neoplasm of upper-outer quadrant of unspecified female breast: Secondary | ICD-10-CM | POA: Diagnosis not present

## 2013-11-26 DIAGNOSIS — M24519 Contracture, unspecified shoulder: Secondary | ICD-10-CM | POA: Diagnosis not present

## 2013-11-26 DIAGNOSIS — Z510 Encounter for antineoplastic radiation therapy: Secondary | ICD-10-CM | POA: Diagnosis not present

## 2013-11-26 DIAGNOSIS — R209 Unspecified disturbances of skin sensation: Secondary | ICD-10-CM | POA: Diagnosis not present

## 2013-11-26 DIAGNOSIS — M79609 Pain in unspecified limb: Secondary | ICD-10-CM | POA: Diagnosis not present

## 2013-11-26 DIAGNOSIS — Z170 Estrogen receptor positive status [ER+]: Secondary | ICD-10-CM | POA: Diagnosis not present

## 2013-11-26 DIAGNOSIS — C50411 Malignant neoplasm of upper-outer quadrant of right female breast: Secondary | ICD-10-CM

## 2013-11-26 DIAGNOSIS — IMO0001 Reserved for inherently not codable concepts without codable children: Secondary | ICD-10-CM | POA: Diagnosis not present

## 2013-11-26 MED ORDER — RADIAPLEXRX EX GEL
Freq: Once | CUTANEOUS | Status: AC
  Administered 2013-11-26: 16:00:00 via TOPICAL

## 2013-11-26 MED ORDER — ALRA NON-METALLIC DEODORANT (RAD-ONC)
1.0000 "application " | Freq: Once | TOPICAL | Status: AC
  Administered 2013-11-26: 1 via TOPICAL

## 2013-11-26 NOTE — Progress Notes (Signed)
Weekly rad txs, 4 on rt breast, has swelling above subclavian,, tender,   Patient education done, radiaplex gel, alra, rad book,  Given,  Discussed side effects fatigue, skin irritation,swelling, pain, increae protein in diet, stay hydrated, drink pleenty water, c/o pain , says she has a hematoma at incision site,  Verbal understanding, sore on right shoulder., appetite good, energy level down 3:24 PM

## 2013-11-26 NOTE — Progress Notes (Signed)
Weekly Management Note Current Dose: 7.2  Gy  Projected Dose: 60.4 Gy   Narrative:  The patient presents for routine under treatment assessment.  CBCT/MVCT images/Port film x-rays were reviewed.  The chart was checked. Grabbed friend from fall again. Sent to PT for evaluation due to pain and numbness down her right arm. Some swelling.   Physical Findings: Weight: 232 lb 14.4 oz (105.643 kg). No skin changes. Some niminal edema of the right arm.  Impression:  The patient is tolerating radiation.  Plan:  Continue treatment as planned.Pt eval this afternoon. Warned to have roommate evaluated for falls. RN education performed.

## 2013-11-27 ENCOUNTER — Ambulatory Visit: Payer: Medicare Other | Attending: Oncology | Admitting: Physical Therapy

## 2013-11-27 ENCOUNTER — Ambulatory Visit
Admission: RE | Admit: 2013-11-27 | Discharge: 2013-11-27 | Disposition: A | Discharge status: Home / Self Care | Payer: Medicare Other | Source: Ambulatory Visit | Attending: Radiation Oncology | Admitting: Radiation Oncology

## 2013-11-27 DIAGNOSIS — M24519 Contracture, unspecified shoulder: Secondary | ICD-10-CM | POA: Insufficient documentation

## 2013-11-27 DIAGNOSIS — R609 Edema, unspecified: Secondary | ICD-10-CM | POA: Diagnosis not present

## 2013-11-27 DIAGNOSIS — C50919 Malignant neoplasm of unspecified site of unspecified female breast: Secondary | ICD-10-CM | POA: Insufficient documentation

## 2013-11-27 DIAGNOSIS — Z510 Encounter for antineoplastic radiation therapy: Secondary | ICD-10-CM | POA: Diagnosis not present

## 2013-11-27 DIAGNOSIS — R209 Unspecified disturbances of skin sensation: Secondary | ICD-10-CM | POA: Diagnosis not present

## 2013-11-27 DIAGNOSIS — M79609 Pain in unspecified limb: Secondary | ICD-10-CM | POA: Diagnosis not present

## 2013-11-27 DIAGNOSIS — Z170 Estrogen receptor positive status [ER+]: Secondary | ICD-10-CM | POA: Diagnosis not present

## 2013-11-27 DIAGNOSIS — IMO0001 Reserved for inherently not codable concepts without codable children: Secondary | ICD-10-CM | POA: Insufficient documentation

## 2013-11-27 DIAGNOSIS — C50419 Malignant neoplasm of upper-outer quadrant of unspecified female breast: Secondary | ICD-10-CM | POA: Diagnosis not present

## 2013-11-28 ENCOUNTER — Ambulatory Visit
Admission: RE | Admit: 2013-11-28 | Discharge: 2013-11-28 | Disposition: A | Discharge status: Home / Self Care | Payer: Medicare Other | Source: Ambulatory Visit | Attending: Radiation Oncology | Admitting: Radiation Oncology

## 2013-11-28 ENCOUNTER — Encounter: Payer: Medicare Other | Admitting: Physical Therapy

## 2013-11-28 DIAGNOSIS — M79609 Pain in unspecified limb: Secondary | ICD-10-CM | POA: Diagnosis not present

## 2013-11-28 DIAGNOSIS — R209 Unspecified disturbances of skin sensation: Secondary | ICD-10-CM | POA: Diagnosis not present

## 2013-11-28 DIAGNOSIS — Z510 Encounter for antineoplastic radiation therapy: Secondary | ICD-10-CM | POA: Diagnosis not present

## 2013-11-28 DIAGNOSIS — R609 Edema, unspecified: Secondary | ICD-10-CM | POA: Diagnosis not present

## 2013-11-28 DIAGNOSIS — C50419 Malignant neoplasm of upper-outer quadrant of unspecified female breast: Secondary | ICD-10-CM | POA: Diagnosis not present

## 2013-11-28 DIAGNOSIS — Z170 Estrogen receptor positive status [ER+]: Secondary | ICD-10-CM | POA: Diagnosis not present

## 2013-12-02 ENCOUNTER — Ambulatory Visit
Admission: RE | Admit: 2013-12-02 | Discharge: 2013-12-02 | Disposition: A | Discharge status: Home / Self Care | Payer: Medicare Other | Source: Ambulatory Visit | Attending: Radiation Oncology | Admitting: Radiation Oncology

## 2013-12-02 ENCOUNTER — Ambulatory Visit: Payer: Medicare Other | Admitting: Physical Therapy

## 2013-12-02 ENCOUNTER — Encounter: Payer: Medicare Other | Admitting: Physical Therapy

## 2013-12-02 DIAGNOSIS — IMO0001 Reserved for inherently not codable concepts without codable children: Secondary | ICD-10-CM | POA: Diagnosis not present

## 2013-12-02 DIAGNOSIS — M24519 Contracture, unspecified shoulder: Secondary | ICD-10-CM | POA: Diagnosis not present

## 2013-12-02 DIAGNOSIS — R209 Unspecified disturbances of skin sensation: Secondary | ICD-10-CM | POA: Diagnosis not present

## 2013-12-02 DIAGNOSIS — Z510 Encounter for antineoplastic radiation therapy: Secondary | ICD-10-CM | POA: Diagnosis not present

## 2013-12-02 DIAGNOSIS — Z170 Estrogen receptor positive status [ER+]: Secondary | ICD-10-CM | POA: Diagnosis not present

## 2013-12-02 DIAGNOSIS — R609 Edema, unspecified: Secondary | ICD-10-CM | POA: Diagnosis not present

## 2013-12-02 DIAGNOSIS — C50919 Malignant neoplasm of unspecified site of unspecified female breast: Secondary | ICD-10-CM | POA: Diagnosis not present

## 2013-12-02 DIAGNOSIS — C50419 Malignant neoplasm of upper-outer quadrant of unspecified female breast: Secondary | ICD-10-CM | POA: Diagnosis not present

## 2013-12-02 DIAGNOSIS — M79609 Pain in unspecified limb: Secondary | ICD-10-CM | POA: Diagnosis not present

## 2013-12-03 ENCOUNTER — Ambulatory Visit
Admission: RE | Admit: 2013-12-03 | Discharge: 2013-12-03 | Disposition: A | Discharge status: Home / Self Care | Payer: Medicare Other | Source: Ambulatory Visit | Attending: Radiation Oncology | Admitting: Radiation Oncology

## 2013-12-03 VITALS — BP 109/45 | HR 73 | Temp 97.7°F | Wt 232.4 lb

## 2013-12-03 DIAGNOSIS — C50411 Malignant neoplasm of upper-outer quadrant of right female breast: Secondary | ICD-10-CM

## 2013-12-03 DIAGNOSIS — R209 Unspecified disturbances of skin sensation: Secondary | ICD-10-CM | POA: Diagnosis not present

## 2013-12-03 DIAGNOSIS — Z170 Estrogen receptor positive status [ER+]: Secondary | ICD-10-CM | POA: Diagnosis not present

## 2013-12-03 DIAGNOSIS — Z510 Encounter for antineoplastic radiation therapy: Secondary | ICD-10-CM | POA: Diagnosis not present

## 2013-12-03 DIAGNOSIS — C50419 Malignant neoplasm of upper-outer quadrant of unspecified female breast: Secondary | ICD-10-CM | POA: Diagnosis not present

## 2013-12-03 DIAGNOSIS — M79609 Pain in unspecified limb: Secondary | ICD-10-CM | POA: Diagnosis not present

## 2013-12-03 DIAGNOSIS — R609 Edema, unspecified: Secondary | ICD-10-CM | POA: Diagnosis not present

## 2013-12-03 NOTE — Progress Notes (Signed)
Weekly assessment of radiation to right breast.Has some swelling of right clavicular region.Has some tenderness of breast with shooting pain which I informed as not out of ordinary.Shortness of breath and fatigue all the time.

## 2013-12-03 NOTE — Progress Notes (Signed)
Weekly Management Note Current Dose: 14.4  Gy  Projected Dose: 60.4 Gy   Narrative:  The patient presents for routine under treatment assessment.  CBCT/MVCT images/Port film x-rays were reviewed.  The chart was checked. Doing well. Still complains of soreness and swelling of her right breast. Pulling and pain of her right shoulder. Is working with PT. Takes pain meds after RT but they knock her out. Worried about low diastolic BP.   Physical Findings: Weight: 232 lb 6.4 oz (105.416 kg). Unchanged  Impression:  The patient is tolerating radiation.  Plan:  Continue treatment as planned. Continue radiaplex. Discuss pain meds and BP with PCP.

## 2013-12-04 ENCOUNTER — Encounter: Payer: Medicare Other | Admitting: Physical Therapy

## 2013-12-04 ENCOUNTER — Ambulatory Visit: Payer: Medicare Other

## 2013-12-04 ENCOUNTER — Ambulatory Visit
Admission: RE | Admit: 2013-12-04 | Discharge: 2013-12-04 | Disposition: A | Discharge status: Home / Self Care | Payer: Medicare Other | Source: Ambulatory Visit | Attending: Radiation Oncology | Admitting: Radiation Oncology

## 2013-12-04 DIAGNOSIS — IMO0001 Reserved for inherently not codable concepts without codable children: Secondary | ICD-10-CM | POA: Diagnosis not present

## 2013-12-04 DIAGNOSIS — Z510 Encounter for antineoplastic radiation therapy: Secondary | ICD-10-CM | POA: Diagnosis not present

## 2013-12-04 DIAGNOSIS — R209 Unspecified disturbances of skin sensation: Secondary | ICD-10-CM | POA: Diagnosis not present

## 2013-12-04 DIAGNOSIS — Z170 Estrogen receptor positive status [ER+]: Secondary | ICD-10-CM | POA: Diagnosis not present

## 2013-12-04 DIAGNOSIS — C50419 Malignant neoplasm of upper-outer quadrant of unspecified female breast: Secondary | ICD-10-CM | POA: Diagnosis not present

## 2013-12-04 DIAGNOSIS — M79609 Pain in unspecified limb: Secondary | ICD-10-CM | POA: Diagnosis not present

## 2013-12-04 DIAGNOSIS — R609 Edema, unspecified: Secondary | ICD-10-CM | POA: Diagnosis not present

## 2013-12-05 ENCOUNTER — Ambulatory Visit
Admission: RE | Admit: 2013-12-05 | Discharge: 2013-12-05 | Disposition: A | Discharge status: Home / Self Care | Payer: Medicare Other | Source: Ambulatory Visit | Attending: Radiation Oncology | Admitting: Radiation Oncology

## 2013-12-05 DIAGNOSIS — R209 Unspecified disturbances of skin sensation: Secondary | ICD-10-CM | POA: Diagnosis not present

## 2013-12-05 DIAGNOSIS — Z510 Encounter for antineoplastic radiation therapy: Secondary | ICD-10-CM | POA: Diagnosis not present

## 2013-12-05 DIAGNOSIS — R609 Edema, unspecified: Secondary | ICD-10-CM | POA: Diagnosis not present

## 2013-12-05 DIAGNOSIS — C50419 Malignant neoplasm of upper-outer quadrant of unspecified female breast: Secondary | ICD-10-CM | POA: Diagnosis not present

## 2013-12-05 DIAGNOSIS — Z170 Estrogen receptor positive status [ER+]: Secondary | ICD-10-CM | POA: Diagnosis not present

## 2013-12-05 DIAGNOSIS — M79609 Pain in unspecified limb: Secondary | ICD-10-CM | POA: Diagnosis not present

## 2013-12-06 ENCOUNTER — Ambulatory Visit
Admission: RE | Admit: 2013-12-06 | Discharge: 2013-12-06 | Disposition: A | Discharge status: Home / Self Care | Payer: Medicare Other | Source: Ambulatory Visit | Attending: Radiation Oncology | Admitting: Radiation Oncology

## 2013-12-06 DIAGNOSIS — Z510 Encounter for antineoplastic radiation therapy: Secondary | ICD-10-CM | POA: Diagnosis not present

## 2013-12-06 DIAGNOSIS — R609 Edema, unspecified: Secondary | ICD-10-CM | POA: Diagnosis not present

## 2013-12-06 DIAGNOSIS — C50419 Malignant neoplasm of upper-outer quadrant of unspecified female breast: Secondary | ICD-10-CM | POA: Diagnosis not present

## 2013-12-06 DIAGNOSIS — Z170 Estrogen receptor positive status [ER+]: Secondary | ICD-10-CM | POA: Diagnosis not present

## 2013-12-06 DIAGNOSIS — R209 Unspecified disturbances of skin sensation: Secondary | ICD-10-CM | POA: Diagnosis not present

## 2013-12-06 DIAGNOSIS — M79609 Pain in unspecified limb: Secondary | ICD-10-CM | POA: Diagnosis not present

## 2013-12-09 ENCOUNTER — Encounter: Payer: Medicare Other | Admitting: Physical Therapy

## 2013-12-09 ENCOUNTER — Ambulatory Visit
Admission: RE | Admit: 2013-12-09 | Discharge: 2013-12-09 | Disposition: A | Discharge status: Home / Self Care | Payer: Medicare Other | Source: Ambulatory Visit | Attending: Radiation Oncology | Admitting: Radiation Oncology

## 2013-12-09 DIAGNOSIS — R209 Unspecified disturbances of skin sensation: Secondary | ICD-10-CM | POA: Diagnosis not present

## 2013-12-09 DIAGNOSIS — R609 Edema, unspecified: Secondary | ICD-10-CM | POA: Diagnosis not present

## 2013-12-09 DIAGNOSIS — M79609 Pain in unspecified limb: Secondary | ICD-10-CM | POA: Diagnosis not present

## 2013-12-09 DIAGNOSIS — Z170 Estrogen receptor positive status [ER+]: Secondary | ICD-10-CM | POA: Diagnosis not present

## 2013-12-09 DIAGNOSIS — C50419 Malignant neoplasm of upper-outer quadrant of unspecified female breast: Secondary | ICD-10-CM | POA: Diagnosis not present

## 2013-12-09 DIAGNOSIS — Z510 Encounter for antineoplastic radiation therapy: Secondary | ICD-10-CM | POA: Diagnosis not present

## 2013-12-10 ENCOUNTER — Ambulatory Visit
Admission: RE | Admit: 2013-12-10 | Discharge: 2013-12-10 | Disposition: A | Discharge status: Home / Self Care | Payer: Medicare Other | Source: Ambulatory Visit | Attending: Radiation Oncology | Admitting: Radiation Oncology

## 2013-12-10 ENCOUNTER — Encounter: Payer: Self-pay | Admitting: Radiation Oncology

## 2013-12-10 ENCOUNTER — Ambulatory Visit: Payer: Medicare Other | Admitting: Physical Therapy

## 2013-12-10 ENCOUNTER — Ambulatory Visit: Payer: Medicare Other | Admitting: Radiation Oncology

## 2013-12-10 VITALS — BP 124/78 | HR 81 | Temp 97.9°F | Resp 20 | Wt 232.8 lb

## 2013-12-10 DIAGNOSIS — M79609 Pain in unspecified limb: Secondary | ICD-10-CM | POA: Diagnosis not present

## 2013-12-10 DIAGNOSIS — Z510 Encounter for antineoplastic radiation therapy: Secondary | ICD-10-CM | POA: Diagnosis not present

## 2013-12-10 DIAGNOSIS — IMO0001 Reserved for inherently not codable concepts without codable children: Secondary | ICD-10-CM | POA: Diagnosis not present

## 2013-12-10 DIAGNOSIS — R609 Edema, unspecified: Secondary | ICD-10-CM | POA: Diagnosis not present

## 2013-12-10 DIAGNOSIS — C50419 Malignant neoplasm of upper-outer quadrant of unspecified female breast: Secondary | ICD-10-CM | POA: Diagnosis not present

## 2013-12-10 DIAGNOSIS — Z170 Estrogen receptor positive status [ER+]: Secondary | ICD-10-CM | POA: Diagnosis not present

## 2013-12-10 DIAGNOSIS — C50411 Malignant neoplasm of upper-outer quadrant of right female breast: Secondary | ICD-10-CM

## 2013-12-10 DIAGNOSIS — R209 Unspecified disturbances of skin sensation: Secondary | ICD-10-CM | POA: Diagnosis not present

## 2013-12-10 NOTE — Progress Notes (Signed)
Weekly rad txs rt breast, mild erythema, states pain comes and goes in right breast,  Using radiaplex bid, like alra deodorant better, had physical therapy today,  Stated pain in under arm where incision is, gets sob with exertion, takes tylenol during the day and OXYIR at night,  Fatigued, , appetite fair, drinking plenty water stated,

## 2013-12-10 NOTE — Progress Notes (Signed)
Weekly Management Note Current Dose:  23.4 Gy  Projected Dose: 61 Gy   Narrative:  The patient presents for routine under treatment assessment.  CBCT/MVCT images/Port film x-rays were reviewed.  The chart was checked. Doing well. Working with PT. Breast pain continues. Shortness of breath is stable.   Physical Findings: Weight: 232 lb 12.8 oz (105.597 kg). No skin changes. No erythema. Minimal swelling. Scar tissue palpable below the scar.   Impression:  The patient is tolerating radiation.  Plan:  Continue treatment as planned. Continue PT. Continue radiaplex.

## 2013-12-11 ENCOUNTER — Ambulatory Visit
Admission: RE | Admit: 2013-12-11 | Discharge: 2013-12-11 | Disposition: A | Discharge status: Home / Self Care | Payer: Medicare Other | Source: Ambulatory Visit | Attending: Radiation Oncology | Admitting: Radiation Oncology

## 2013-12-11 ENCOUNTER — Ambulatory Visit (INDEPENDENT_AMBULATORY_CARE_PROVIDER_SITE_OTHER): Payer: Medicare Other | Admitting: General Practice

## 2013-12-11 ENCOUNTER — Encounter: Payer: Medicare Other | Admitting: Physical Therapy

## 2013-12-11 DIAGNOSIS — Z170 Estrogen receptor positive status [ER+]: Secondary | ICD-10-CM | POA: Diagnosis not present

## 2013-12-11 DIAGNOSIS — Z510 Encounter for antineoplastic radiation therapy: Secondary | ICD-10-CM | POA: Diagnosis not present

## 2013-12-11 DIAGNOSIS — I82409 Acute embolism and thrombosis of unspecified deep veins of unspecified lower extremity: Secondary | ICD-10-CM | POA: Diagnosis not present

## 2013-12-11 DIAGNOSIS — M79609 Pain in unspecified limb: Secondary | ICD-10-CM | POA: Diagnosis not present

## 2013-12-11 DIAGNOSIS — R209 Unspecified disturbances of skin sensation: Secondary | ICD-10-CM | POA: Diagnosis not present

## 2013-12-11 DIAGNOSIS — C50419 Malignant neoplasm of upper-outer quadrant of unspecified female breast: Secondary | ICD-10-CM | POA: Diagnosis not present

## 2013-12-11 DIAGNOSIS — I82402 Acute embolism and thrombosis of unspecified deep veins of left lower extremity: Secondary | ICD-10-CM

## 2013-12-11 DIAGNOSIS — Z5181 Encounter for therapeutic drug level monitoring: Secondary | ICD-10-CM | POA: Diagnosis not present

## 2013-12-11 DIAGNOSIS — R609 Edema, unspecified: Secondary | ICD-10-CM | POA: Diagnosis not present

## 2013-12-11 LAB — POCT INR: INR: 3.5

## 2013-12-11 NOTE — Progress Notes (Signed)
Pre visit review using our clinic review tool, if applicable. No additional management support is needed unless otherwise documented below in the visit note. 

## 2013-12-12 ENCOUNTER — Ambulatory Visit
Admission: RE | Admit: 2013-12-12 | Discharge: 2013-12-12 | Disposition: A | Discharge status: Home / Self Care | Payer: Medicare Other | Source: Ambulatory Visit | Attending: Radiation Oncology | Admitting: Radiation Oncology

## 2013-12-12 ENCOUNTER — Ambulatory Visit: Payer: Medicare Other | Admitting: Physical Therapy

## 2013-12-12 DIAGNOSIS — Z510 Encounter for antineoplastic radiation therapy: Secondary | ICD-10-CM | POA: Diagnosis not present

## 2013-12-12 DIAGNOSIS — R209 Unspecified disturbances of skin sensation: Secondary | ICD-10-CM | POA: Diagnosis not present

## 2013-12-12 DIAGNOSIS — M79609 Pain in unspecified limb: Secondary | ICD-10-CM | POA: Diagnosis not present

## 2013-12-12 DIAGNOSIS — R609 Edema, unspecified: Secondary | ICD-10-CM | POA: Diagnosis not present

## 2013-12-12 DIAGNOSIS — IMO0001 Reserved for inherently not codable concepts without codable children: Secondary | ICD-10-CM | POA: Diagnosis not present

## 2013-12-12 DIAGNOSIS — Z170 Estrogen receptor positive status [ER+]: Secondary | ICD-10-CM | POA: Diagnosis not present

## 2013-12-12 DIAGNOSIS — C50419 Malignant neoplasm of upper-outer quadrant of unspecified female breast: Secondary | ICD-10-CM | POA: Diagnosis not present

## 2013-12-13 ENCOUNTER — Ambulatory Visit
Admission: RE | Admit: 2013-12-13 | Discharge: 2013-12-13 | Disposition: A | Discharge status: Home / Self Care | Payer: Medicare Other | Source: Ambulatory Visit | Attending: Radiation Oncology | Admitting: Radiation Oncology

## 2013-12-13 DIAGNOSIS — Z170 Estrogen receptor positive status [ER+]: Secondary | ICD-10-CM | POA: Diagnosis not present

## 2013-12-13 DIAGNOSIS — R609 Edema, unspecified: Secondary | ICD-10-CM | POA: Diagnosis not present

## 2013-12-13 DIAGNOSIS — R209 Unspecified disturbances of skin sensation: Secondary | ICD-10-CM | POA: Diagnosis not present

## 2013-12-13 DIAGNOSIS — C50419 Malignant neoplasm of upper-outer quadrant of unspecified female breast: Secondary | ICD-10-CM | POA: Diagnosis not present

## 2013-12-13 DIAGNOSIS — M79609 Pain in unspecified limb: Secondary | ICD-10-CM | POA: Diagnosis not present

## 2013-12-13 DIAGNOSIS — Z510 Encounter for antineoplastic radiation therapy: Secondary | ICD-10-CM | POA: Diagnosis not present

## 2013-12-16 ENCOUNTER — Ambulatory Visit: Payer: Medicare Other | Admitting: Physical Therapy

## 2013-12-16 ENCOUNTER — Encounter: Payer: Medicare Other | Admitting: Physical Therapy

## 2013-12-16 ENCOUNTER — Ambulatory Visit
Admission: RE | Admit: 2013-12-16 | Discharge: 2013-12-16 | Disposition: A | Discharge status: Home / Self Care | Payer: Medicare Other | Source: Ambulatory Visit | Attending: Radiation Oncology | Admitting: Radiation Oncology

## 2013-12-16 DIAGNOSIS — IMO0001 Reserved for inherently not codable concepts without codable children: Secondary | ICD-10-CM | POA: Diagnosis not present

## 2013-12-16 DIAGNOSIS — Z170 Estrogen receptor positive status [ER+]: Secondary | ICD-10-CM | POA: Diagnosis not present

## 2013-12-16 DIAGNOSIS — R609 Edema, unspecified: Secondary | ICD-10-CM | POA: Diagnosis not present

## 2013-12-16 DIAGNOSIS — C50419 Malignant neoplasm of upper-outer quadrant of unspecified female breast: Secondary | ICD-10-CM | POA: Diagnosis not present

## 2013-12-16 DIAGNOSIS — Z510 Encounter for antineoplastic radiation therapy: Secondary | ICD-10-CM | POA: Diagnosis not present

## 2013-12-16 DIAGNOSIS — M79609 Pain in unspecified limb: Secondary | ICD-10-CM | POA: Diagnosis not present

## 2013-12-16 DIAGNOSIS — R209 Unspecified disturbances of skin sensation: Secondary | ICD-10-CM | POA: Diagnosis not present

## 2013-12-17 ENCOUNTER — Ambulatory Visit
Admission: RE | Admit: 2013-12-17 | Discharge: 2013-12-17 | Disposition: A | Discharge status: Home / Self Care | Payer: Medicare Other | Source: Ambulatory Visit | Attending: Radiation Oncology | Admitting: Radiation Oncology

## 2013-12-17 VITALS — BP 134/60 | HR 82 | Temp 97.7°F | Wt 234.6 lb

## 2013-12-17 DIAGNOSIS — Z170 Estrogen receptor positive status [ER+]: Secondary | ICD-10-CM | POA: Diagnosis not present

## 2013-12-17 DIAGNOSIS — M79609 Pain in unspecified limb: Secondary | ICD-10-CM | POA: Diagnosis not present

## 2013-12-17 DIAGNOSIS — C50411 Malignant neoplasm of upper-outer quadrant of right female breast: Secondary | ICD-10-CM

## 2013-12-17 DIAGNOSIS — R609 Edema, unspecified: Secondary | ICD-10-CM | POA: Diagnosis not present

## 2013-12-17 DIAGNOSIS — Z510 Encounter for antineoplastic radiation therapy: Secondary | ICD-10-CM | POA: Diagnosis not present

## 2013-12-17 DIAGNOSIS — C50419 Malignant neoplasm of upper-outer quadrant of unspecified female breast: Secondary | ICD-10-CM | POA: Diagnosis not present

## 2013-12-17 DIAGNOSIS — R209 Unspecified disturbances of skin sensation: Secondary | ICD-10-CM | POA: Diagnosis not present

## 2013-12-17 NOTE — Progress Notes (Signed)
Weekly Management Note Current Dose: 32.4  Gy  Projected Dose: 50.4 Gy   Narrative:  The patient presents for routine under treatment assessment.  CBCT/MVCT images/Port film x-rays were reviewed.  The chart was checked.Doing well. Breast is sore. Working with PT.   Physical Findings: Weight: 234 lb 9.6 oz (106.414 kg). Minimal skin changes.   Impression:  The patient is tolerating radiation.  Plan:  Continue treatment as planned. Continue radiaplex.

## 2013-12-17 NOTE — Progress Notes (Signed)
Weekly assessment of radiation to right  Breast.Completed 18 of 28 treatments.Skin is dull red.Mild breast pain.Will give another tube of radiaplex.

## 2013-12-18 ENCOUNTER — Ambulatory Visit
Admission: RE | Admit: 2013-12-18 | Discharge: 2013-12-18 | Disposition: A | Discharge status: Home / Self Care | Payer: Medicare Other | Source: Ambulatory Visit | Attending: Radiation Oncology | Admitting: Radiation Oncology

## 2013-12-18 ENCOUNTER — Encounter: Payer: Medicare Other | Admitting: Physical Therapy

## 2013-12-18 ENCOUNTER — Ambulatory Visit: Payer: Medicare Other | Admitting: Radiation Oncology

## 2013-12-18 ENCOUNTER — Ambulatory Visit: Payer: Medicare Other | Admitting: Physical Therapy

## 2013-12-18 ENCOUNTER — Ambulatory Visit: Payer: Medicare Other | Admitting: Oncology

## 2013-12-18 DIAGNOSIS — R209 Unspecified disturbances of skin sensation: Secondary | ICD-10-CM | POA: Diagnosis not present

## 2013-12-18 DIAGNOSIS — IMO0001 Reserved for inherently not codable concepts without codable children: Secondary | ICD-10-CM | POA: Diagnosis not present

## 2013-12-18 DIAGNOSIS — Z510 Encounter for antineoplastic radiation therapy: Secondary | ICD-10-CM | POA: Diagnosis not present

## 2013-12-18 DIAGNOSIS — R609 Edema, unspecified: Secondary | ICD-10-CM | POA: Diagnosis not present

## 2013-12-18 DIAGNOSIS — Z170 Estrogen receptor positive status [ER+]: Secondary | ICD-10-CM | POA: Diagnosis not present

## 2013-12-18 DIAGNOSIS — M79609 Pain in unspecified limb: Secondary | ICD-10-CM | POA: Diagnosis not present

## 2013-12-18 DIAGNOSIS — C50419 Malignant neoplasm of upper-outer quadrant of unspecified female breast: Secondary | ICD-10-CM | POA: Diagnosis not present

## 2013-12-19 ENCOUNTER — Ambulatory Visit: Payer: Medicare Other | Admitting: Radiation Oncology

## 2013-12-19 ENCOUNTER — Ambulatory Visit
Admission: RE | Admit: 2013-12-19 | Discharge: 2013-12-19 | Disposition: A | Discharge status: Home / Self Care | Payer: Medicare Other | Source: Ambulatory Visit | Attending: Radiation Oncology | Admitting: Radiation Oncology

## 2013-12-19 DIAGNOSIS — Z510 Encounter for antineoplastic radiation therapy: Secondary | ICD-10-CM | POA: Diagnosis not present

## 2013-12-19 DIAGNOSIS — M79609 Pain in unspecified limb: Secondary | ICD-10-CM | POA: Diagnosis not present

## 2013-12-19 DIAGNOSIS — R609 Edema, unspecified: Secondary | ICD-10-CM | POA: Diagnosis not present

## 2013-12-19 DIAGNOSIS — Z170 Estrogen receptor positive status [ER+]: Secondary | ICD-10-CM | POA: Diagnosis not present

## 2013-12-19 DIAGNOSIS — C50419 Malignant neoplasm of upper-outer quadrant of unspecified female breast: Secondary | ICD-10-CM | POA: Diagnosis not present

## 2013-12-19 DIAGNOSIS — R209 Unspecified disturbances of skin sensation: Secondary | ICD-10-CM | POA: Diagnosis not present

## 2013-12-20 ENCOUNTER — Ambulatory Visit
Admission: RE | Admit: 2013-12-20 | Discharge: 2013-12-20 | Disposition: A | Discharge status: Home / Self Care | Payer: Medicare Other | Source: Ambulatory Visit | Attending: Radiation Oncology | Admitting: Radiation Oncology

## 2013-12-20 DIAGNOSIS — R609 Edema, unspecified: Secondary | ICD-10-CM | POA: Diagnosis not present

## 2013-12-20 DIAGNOSIS — R209 Unspecified disturbances of skin sensation: Secondary | ICD-10-CM | POA: Diagnosis not present

## 2013-12-20 DIAGNOSIS — Z510 Encounter for antineoplastic radiation therapy: Secondary | ICD-10-CM | POA: Diagnosis not present

## 2013-12-20 DIAGNOSIS — M79609 Pain in unspecified limb: Secondary | ICD-10-CM | POA: Diagnosis not present

## 2013-12-20 DIAGNOSIS — C50419 Malignant neoplasm of upper-outer quadrant of unspecified female breast: Secondary | ICD-10-CM | POA: Diagnosis not present

## 2013-12-20 DIAGNOSIS — Z170 Estrogen receptor positive status [ER+]: Secondary | ICD-10-CM | POA: Diagnosis not present

## 2013-12-23 ENCOUNTER — Ambulatory Visit
Admission: RE | Admit: 2013-12-23 | Discharge: 2013-12-23 | Disposition: A | Discharge status: Home / Self Care | Payer: Medicare Other | Source: Ambulatory Visit | Attending: Radiation Oncology | Admitting: Radiation Oncology

## 2013-12-23 DIAGNOSIS — Z510 Encounter for antineoplastic radiation therapy: Secondary | ICD-10-CM | POA: Diagnosis not present

## 2013-12-23 DIAGNOSIS — R609 Edema, unspecified: Secondary | ICD-10-CM | POA: Diagnosis not present

## 2013-12-23 DIAGNOSIS — C50419 Malignant neoplasm of upper-outer quadrant of unspecified female breast: Secondary | ICD-10-CM | POA: Diagnosis not present

## 2013-12-23 DIAGNOSIS — Z170 Estrogen receptor positive status [ER+]: Secondary | ICD-10-CM | POA: Diagnosis not present

## 2013-12-23 DIAGNOSIS — M79609 Pain in unspecified limb: Secondary | ICD-10-CM | POA: Diagnosis not present

## 2013-12-23 DIAGNOSIS — R209 Unspecified disturbances of skin sensation: Secondary | ICD-10-CM | POA: Diagnosis not present

## 2013-12-24 ENCOUNTER — Telehealth: Payer: Self-pay

## 2013-12-24 ENCOUNTER — Ambulatory Visit
Admission: RE | Admit: 2013-12-24 | Discharge: 2013-12-24 | Disposition: A | Discharge status: Home / Self Care | Payer: Medicare Other | Source: Ambulatory Visit | Attending: Radiation Oncology | Admitting: Radiation Oncology

## 2013-12-24 ENCOUNTER — Ambulatory Visit: Payer: Medicare Other | Admitting: Radiation Oncology

## 2013-12-24 VITALS — BP 174/79 | HR 86 | Temp 97.9°F | Wt 233.5 lb

## 2013-12-24 DIAGNOSIS — C50411 Malignant neoplasm of upper-outer quadrant of right female breast: Secondary | ICD-10-CM

## 2013-12-24 DIAGNOSIS — R609 Edema, unspecified: Secondary | ICD-10-CM | POA: Diagnosis not present

## 2013-12-24 DIAGNOSIS — C50419 Malignant neoplasm of upper-outer quadrant of unspecified female breast: Secondary | ICD-10-CM | POA: Diagnosis not present

## 2013-12-24 DIAGNOSIS — M79609 Pain in unspecified limb: Secondary | ICD-10-CM | POA: Diagnosis not present

## 2013-12-24 DIAGNOSIS — R209 Unspecified disturbances of skin sensation: Secondary | ICD-10-CM | POA: Diagnosis not present

## 2013-12-24 DIAGNOSIS — Z510 Encounter for antineoplastic radiation therapy: Secondary | ICD-10-CM | POA: Diagnosis not present

## 2013-12-24 DIAGNOSIS — Z170 Estrogen receptor positive status [ER+]: Secondary | ICD-10-CM | POA: Diagnosis not present

## 2013-12-24 NOTE — Progress Notes (Signed)
Weekly assessment of radiation to right breast.Completed 23 of 28 treatments.Skin is red especially of clavicle region.Has some swelling and firmness of breast.

## 2013-12-24 NOTE — Progress Notes (Signed)
  Radiation Oncology         [phone] ________________________________  Name: Alicia Vasquez MRN: [id number]  Date: 12/24/2013  DOB: [birth year]  Weekly Radiation Therapy Management  Breast cancer of upper-outer quadrant of right female breast   Primary site: Breast (Right)   Staging method: AJCC 7th Edition   Clinical: Stage IIA (T1, N1, cM0)   Summary: Stage IIA (T1, N1, cM0)   Clinical comments: Staged at breast conference 07/24/13.   Current Dose: 41.4 Gy     Planned Dose:  50.4 Gy  Narrative . . . . . . . . The patient presents for routine under treatment assessment.                                   The patient is without complaint except for problems with one of her teeth                                 Set-up films were reviewed.                                 The chart was checked. Physical Findings. . .  weight is 233 lb 8 oz (105.915 kg). Her temperature is 97.9 F (36.6 C). Her blood pressure is 174/79 and her pulse is 86. . The right breast area shows some erythema. The supraclavicular area also shows some erythema but no skin breakdown. Impression . . . . . . . The patient is tolerating radiation. Plan . . . . . . . . . . . . Continue treatment as planned.  ________________________________   Blair Promise, PhD, MD

## 2013-12-24 NOTE — Telephone Encounter (Signed)
Patient is requesting antibiotics for tooth pain.     Her blood pressure is 170/100.  She just finished radiation.   Can someone call in antibiotics for her?    Advised to come in, she wants antibiotics.   [facility]    [phone]

## 2013-12-25 ENCOUNTER — Telehealth: Payer: Self-pay

## 2013-12-25 ENCOUNTER — Ambulatory Visit
Admission: RE | Admit: 2013-12-25 | Discharge: 2013-12-25 | Disposition: A | Discharge status: Home / Self Care | Payer: Medicare Other | Source: Ambulatory Visit | Attending: Radiation Oncology | Admitting: Radiation Oncology

## 2013-12-25 DIAGNOSIS — R209 Unspecified disturbances of skin sensation: Secondary | ICD-10-CM | POA: Diagnosis not present

## 2013-12-25 DIAGNOSIS — C50419 Malignant neoplasm of upper-outer quadrant of unspecified female breast: Secondary | ICD-10-CM | POA: Diagnosis not present

## 2013-12-25 DIAGNOSIS — Z170 Estrogen receptor positive status [ER+]: Secondary | ICD-10-CM | POA: Diagnosis not present

## 2013-12-25 DIAGNOSIS — R609 Edema, unspecified: Secondary | ICD-10-CM | POA: Diagnosis not present

## 2013-12-25 DIAGNOSIS — Z510 Encounter for antineoplastic radiation therapy: Secondary | ICD-10-CM | POA: Diagnosis not present

## 2013-12-25 DIAGNOSIS — M79609 Pain in unspecified limb: Secondary | ICD-10-CM | POA: Diagnosis not present

## 2013-12-25 NOTE — Telephone Encounter (Signed)
Returned patient's call regarding whether she may continue radiation despite starting anti-biotic therapy for bad tooth.Left message for patient to continue treatment as scheduled unless she is just feeling too bad to come in today.

## 2013-12-25 NOTE — Telephone Encounter (Signed)
Pt went to dentist and issue has been resolved

## 2013-12-26 ENCOUNTER — Ambulatory Visit
Admission: RE | Admit: 2013-12-26 | Discharge: 2013-12-26 | Disposition: A | Discharge status: Home / Self Care | Payer: Medicare Other | Source: Ambulatory Visit | Attending: Radiation Oncology | Admitting: Radiation Oncology

## 2013-12-26 ENCOUNTER — Encounter: Payer: Self-pay | Admitting: Family Medicine

## 2013-12-26 ENCOUNTER — Ambulatory Visit (INDEPENDENT_AMBULATORY_CARE_PROVIDER_SITE_OTHER): Payer: Medicare Other | Admitting: Family Medicine

## 2013-12-26 VITALS — BP 120/76 | HR 72 | Temp 97.8°F | Resp 16 | Ht 63.5 in | Wt 231.2 lb

## 2013-12-26 DIAGNOSIS — C50419 Malignant neoplasm of upper-outer quadrant of unspecified female breast: Secondary | ICD-10-CM | POA: Diagnosis not present

## 2013-12-26 DIAGNOSIS — K047 Periapical abscess without sinus: Secondary | ICD-10-CM

## 2013-12-26 DIAGNOSIS — I809 Phlebitis and thrombophlebitis of unspecified site: Secondary | ICD-10-CM

## 2013-12-26 DIAGNOSIS — Z170 Estrogen receptor positive status [ER+]: Secondary | ICD-10-CM | POA: Diagnosis not present

## 2013-12-26 DIAGNOSIS — M79609 Pain in unspecified limb: Secondary | ICD-10-CM | POA: Diagnosis not present

## 2013-12-26 DIAGNOSIS — I890 Lymphedema, not elsewhere classified: Secondary | ICD-10-CM

## 2013-12-26 DIAGNOSIS — Z79899 Other long term (current) drug therapy: Secondary | ICD-10-CM | POA: Diagnosis not present

## 2013-12-26 DIAGNOSIS — Z510 Encounter for antineoplastic radiation therapy: Secondary | ICD-10-CM | POA: Diagnosis not present

## 2013-12-26 DIAGNOSIS — E039 Hypothyroidism, unspecified: Secondary | ICD-10-CM

## 2013-12-26 DIAGNOSIS — R209 Unspecified disturbances of skin sensation: Secondary | ICD-10-CM | POA: Diagnosis not present

## 2013-12-26 DIAGNOSIS — I10 Essential (primary) hypertension: Secondary | ICD-10-CM | POA: Diagnosis not present

## 2013-12-26 DIAGNOSIS — R609 Edema, unspecified: Secondary | ICD-10-CM | POA: Diagnosis not present

## 2013-12-26 LAB — CBC
HCT: 42 % (ref 36.0–46.0)
Hemoglobin: 14.1 g/dL (ref 12.0–15.0)
MCH: 30.6 pg (ref 26.0–34.0)
MCHC: 33.6 g/dL (ref 30.0–36.0)
MCV: 91.1 fL (ref 78.0–100.0)
Platelets: 236 10*3/uL (ref 150–400)
RBC: 4.61 MIL/uL (ref 3.87–5.11)
RDW: 14.6 % (ref 11.5–15.5)
WBC: 6.3 10*3/uL (ref 4.0–10.5)

## 2013-12-26 LAB — COMPREHENSIVE METABOLIC PANEL
ALT: 17 U/L (ref 0–35)
AST: 15 U/L (ref 0–37)
Albumin: 3.9 g/dL (ref 3.5–5.2)
Alkaline Phosphatase: 73 U/L (ref 39–117)
BUN: 15 mg/dL (ref 6–23)
CO2: 28 mEq/L (ref 19–32)
Calcium: 9.7 mg/dL (ref 8.4–10.5)
Chloride: 104 mEq/L (ref 96–112)
Creat: 0.7 mg/dL (ref 0.50–1.10)
Glucose, Bld: 91 mg/dL (ref 70–99)
Potassium: 4.3 mEq/L (ref 3.5–5.3)
Sodium: 137 mEq/L (ref 135–145)
Total Bilirubin: 0.5 mg/dL (ref 0.2–1.2)
Total Protein: 6.5 g/dL (ref 6.0–8.3)

## 2013-12-26 MED ORDER — LEVOTHYROXINE SODIUM 100 MCG PO TABS: 100.0000 ug | ORAL_TABLET | Freq: Every day | ORAL | Status: DC

## 2013-12-26 MED ORDER — LISINOPRIL 10 MG PO TABS: 10.0000 mg | ORAL_TABLET | Freq: Every day | ORAL | Status: DC

## 2013-12-26 MED ORDER — OXYCODONE HCL 5 MG PO TABS: 5.0000 mg | ORAL_TABLET | Freq: Four times a day (QID) | ORAL | Status: DC | PRN

## 2013-12-26 NOTE — Progress Notes (Signed)
Patient ID: Alicia Vasquez MRN: [id number], DOB: Jan 23, 1941, 73 y.o. Date of Encounter: 12/26/2013, 12:02 PM  This chart was scribed for [name], MD by Cathie Hoops, ED Scribe. The patient was seen in Room 24. The patient's care was started at 12:02 PM.   Primary Physician: [name], MD  Chief Complaint: follow-up  HPI: 73 y.o. year old female with history below presents with follow-up. Pt reports having bilateral knee pain. Pt states she has right arm, right hand, and right axillary pain. Pt describes her pain as shooting. Pt reports going to physical therapy.  Pt states she has some lymphoedema. Pt states she gets SOB while walking. Pt also reports having some back pain. Pt reports her tooth broke off and the dentist states the right maxillary jaw is inflammed due to dental absecess. Pt states she was given Clindamycin for her inflammation.   Pt states she is taking the 7.5 MG Coumadin, 3 days/week and 10 MG Coumadin, 4 days/week.  No bleeding episodes.  Pt was seen by Dr. Blair Promise for weekly radiation therapy management for breast cancer of upper-outer quadrant of right female breast on 7/28. Pt's note states: Pt completed 23 of 28 treatments. Pt's skin is red especially of clavicle region. Pt has some swelling and firmness of breast.  Pt's last TSH lab was done by Dr. Joseph Art on 10/10/2013.  TSH levels:  07/04/13 - 2.384   10/18/12 - 2.487    Past Medical History  Diagnosis Date  . Allergy   . Hypertension   . Hypothyroid   . Blood transfusion without reported diagnosis   . Pneumonia   . DVT (deep vein thrombosis) in pregnancy   . Breast cancer 07/12/13    invasive mammary carcinoma  . Anxiety   . Arthritis      Home Meds: Prior to Admission medications   Medication Sig Start Date End Date Taking? Authorizing Provider  acetaminophen (TYLENOL) 500 MG tablet Take 500 mg by mouth every 6 (six) hours as needed for mild pain, fever or headache.   Yes  Historical Provider, MD  hyaluronate sodium (RADIAPLEXRX) GEL Apply 1 application topically 2 (two) times daily. Apply 2x day after rad tx and bedtime daily and on weekends on rt breast 11/26/13  Yes Thea Silversmith, MD  levothyroxine (SYNTHROID) 100 MCG tablet Take 100 mcg by mouth daily. 01/17/13  Yes [name], MD  lisinopril (PRINIVIL,ZESTRIL) 10 MG tablet Take 10 mg by mouth daily.   Yes Historical Provider, MD  non-metallic deodorant Jethro Poling) MISC Apply 1 application topically daily as needed. 11/27/13  Yes Thea Silversmith, MD  oxyCODONE (OXY IR/ROXICODONE) 5 MG immediate release tablet Take 1 tablet (5 mg total) by mouth every 6 (six) hours as needed. 11/05/13  Yes [name], MD  PRESCRIPTION MEDICATION Clindamycin 150 mg taking 2 tabs every 6 hrs for 2 days, then 1 tab every 6 hrs until finished   Yes Historical Provider, MD  warfarin (COUMADIN) 5 MG tablet TAKE AS DIRECTED BY ANTICOAGULATION CLINIC   Yes [name], MD    Allergies:  Allergies  Allergen Reactions  . Anesthetics, Amide Other (See Comments)    ELEVATE BLOOD PRESSURE  . Benadryl [Diphenhydramine Hcl]     dizziness  . Carbocaine [Mepivacaine Hcl] Other (See Comments)    Elevated blood pressure  . Codeine Nausea Only and Other (See Comments)    dizziness  . Epinephrine Other (See Comments)    Elevated blood pressure  . Sulfa Antibiotics Other (  See Comments)    dizziness  . Vicodin [Hydrocodone-Acetaminophen] Nausea Only  . Penicillins Rash    History   Social History  . Marital Status: Widowed    Spouse Name: N/A    Number of Children: 1  . Years of Education: N/A   Occupational History  . Not on file.   Social History Main Topics  . Smoking status: Never Smoker   . Smokeless tobacco: Not on file  . Alcohol Use: No  . Drug Use: No  . Sexual Activity: No   Other Topics Concern  . Not on file   Social History Narrative   Exercise yard work     Review of Systems Constitutional:  negative for chills, fever, night sweats, weight changes, or fatigue  HEENT: negative for vision changes, hearing loss, congestion, rhinorrhea, ST, epistaxis, or sinus pressure Cardiovascular: negative for chest pain or palpitations Respiratory: negative for hemoptysis, wheezing, or cough. Positive for SOB (with ambulation). Abdominal: negative for abdominal pain, nausea, vomiting, diarrhea, or constipation Musculoskeletal: Positive for arthalgias in the knee, back, right arm, right hand and underneath right breast. Dermatological: negative for rash. Positive for color change (right supraclavicular). Neurologic: negative for headache, dizziness, or syncope. Positive for numbness in right arm and hand. All other systems reviewed and are otherwise negative with the exception to those above and in the HPI.  Physical Exam: Blood pressure 120/76, pulse 72, temperature 97.8 F (36.6 C), temperature source Oral, resp. rate 16, height 5' 3.5" (1.613 m), weight 231 lb 3.2 oz (104.872 kg), SpO2 96.00%., Body mass index is 40.31 kg/(m^2). General: Well developed, well nourished, in no acute distress. Head: Normocephalic, right malar region moderately swollen with gingival swelling over roots of teeth 12 through 15. eyes without discharge, sclera non-icteric, nares are without discharge. Bilateral auditory canals clear, TM's are without perforation, pearly grey and translucent with reflective cone of light bilaterally. Oral cavity moist, posterior pharynx without exudate, erythema, peritonsillar abscess, or post nasal drip.  Neck: Supple. No thyromegaly. Full ROM. No lymphadenopathy. Lungs: Clear bilaterally to auscultation without wheezes, rales, or rhonchi. Breathing is unlabored. Heart: RRR with S1 S2. No murmurs, rubs, or gallops appreciated.  Heart sounds distant Abdomen: Soft, non-tender, non-distended with normoactive bowel sounds. No hepatomegaly. No rebound/guarding. No obvious abdominal masses. Msk:   Strength and tone normal for age. Extremities/Skin: Warm and dry. No clubbing or cyanosis. No edema. No rashes or suspicious lesions.  Moderate erythema right medial upper chest.  Mild swelling right supraclavicular area and upper right arm. Neuro: Alert and oriented X 3. Moves all extremities spontaneously. Gait is normal. CNII-XII grossly in tact. Psych:  Responds to questions appropriately with a normal affect.   ASSESSMENT AND PLAN:  73 y.o. year old female with some lymphedema following first few weeks of radiotherapy.  She is more short of breath and weak than last visit, but overall seems to tolerate the treatments. The recent dental abscess seems to be improving. I attribute the dyspnea and weakness to radiotherapy  Unspecified essential hypertension - Plan: CBC, TSH, lisinopril (PRINIVIL,ZESTRIL) 10 MG tablet  Other lymphedema - Plan: Comprehensive metabolic panel, Protime-INR  Dental abscess - Plan: oxyCODONE (OXY IR/ROXICODONE) 5 MG immediate release tablet  Encounter for long-term (current) use of other medications  Phlebitis - Plan: Protime-INR  Unspecified hypothyroidism - Plan: levothyroxine (SYNTHROID) 100 MCG tablet  Recheck one month.  Finish clinda, hold one dose of coumadin, follow up with dentist and oncologist.   Signed, [name], MD 12/26/2013  12:18 PM

## 2013-12-27 ENCOUNTER — Ambulatory Visit
Admission: RE | Admit: 2013-12-27 | Discharge: 2013-12-27 | Disposition: A | Discharge status: Home / Self Care | Payer: Medicare Other | Source: Ambulatory Visit | Attending: Radiation Oncology | Admitting: Radiation Oncology

## 2013-12-27 DIAGNOSIS — R609 Edema, unspecified: Secondary | ICD-10-CM | POA: Diagnosis not present

## 2013-12-27 DIAGNOSIS — M79609 Pain in unspecified limb: Secondary | ICD-10-CM | POA: Diagnosis not present

## 2013-12-27 DIAGNOSIS — R209 Unspecified disturbances of skin sensation: Secondary | ICD-10-CM | POA: Diagnosis not present

## 2013-12-27 DIAGNOSIS — Z510 Encounter for antineoplastic radiation therapy: Secondary | ICD-10-CM | POA: Diagnosis not present

## 2013-12-27 DIAGNOSIS — Z170 Estrogen receptor positive status [ER+]: Secondary | ICD-10-CM | POA: Diagnosis not present

## 2013-12-27 DIAGNOSIS — C50419 Malignant neoplasm of upper-outer quadrant of unspecified female breast: Secondary | ICD-10-CM | POA: Diagnosis not present

## 2013-12-27 LAB — PROTIME-INR
INR: 3.23 — ABNORMAL HIGH (ref <1.50)
Prothrombin Time: 33 seconds — ABNORMAL HIGH (ref 11.6–15.2)

## 2013-12-27 LAB — TSH: TSH: 3.732 u[IU]/mL (ref 0.350–4.500)

## 2013-12-30 ENCOUNTER — Ambulatory Visit
Admission: RE | Admit: 2013-12-30 | Discharge: 2013-12-30 | Disposition: A | Discharge status: Home / Self Care | Payer: Medicare Other | Source: Ambulatory Visit | Attending: Radiation Oncology | Admitting: Radiation Oncology

## 2013-12-30 DIAGNOSIS — Z510 Encounter for antineoplastic radiation therapy: Secondary | ICD-10-CM | POA: Diagnosis not present

## 2013-12-30 DIAGNOSIS — C50419 Malignant neoplasm of upper-outer quadrant of unspecified female breast: Secondary | ICD-10-CM | POA: Diagnosis not present

## 2013-12-30 DIAGNOSIS — R609 Edema, unspecified: Secondary | ICD-10-CM | POA: Diagnosis not present

## 2013-12-30 DIAGNOSIS — R209 Unspecified disturbances of skin sensation: Secondary | ICD-10-CM | POA: Diagnosis not present

## 2013-12-30 DIAGNOSIS — Z170 Estrogen receptor positive status [ER+]: Secondary | ICD-10-CM | POA: Diagnosis not present

## 2013-12-30 DIAGNOSIS — M79609 Pain in unspecified limb: Secondary | ICD-10-CM | POA: Diagnosis not present

## 2013-12-31 ENCOUNTER — Ambulatory Visit: Payer: Medicare Other

## 2013-12-31 ENCOUNTER — Ambulatory Visit
Admission: RE | Admit: 2013-12-31 | Discharge: 2013-12-31 | Disposition: A | Discharge status: Home / Self Care | Payer: Medicare Other | Source: Ambulatory Visit | Attending: Radiation Oncology | Admitting: Radiation Oncology

## 2013-12-31 ENCOUNTER — Ambulatory Visit: Payer: Medicare Other | Admitting: Radiation Oncology

## 2013-12-31 ENCOUNTER — Ambulatory Visit
Admission: RE | Admit: 2013-12-31 | Discharge: 2013-12-31 | Disposition: A | Discharge status: Home / Self Care | Payer: Medicare Other | Source: Ambulatory Visit

## 2013-12-31 VITALS — BP 99/78 | HR 77 | Temp 97.5°F | Wt 234.6 lb

## 2013-12-31 DIAGNOSIS — M79609 Pain in unspecified limb: Secondary | ICD-10-CM | POA: Diagnosis not present

## 2013-12-31 DIAGNOSIS — Z170 Estrogen receptor positive status [ER+]: Secondary | ICD-10-CM | POA: Diagnosis not present

## 2013-12-31 DIAGNOSIS — Z510 Encounter for antineoplastic radiation therapy: Secondary | ICD-10-CM | POA: Diagnosis not present

## 2013-12-31 DIAGNOSIS — C50411 Malignant neoplasm of upper-outer quadrant of right female breast: Secondary | ICD-10-CM

## 2013-12-31 DIAGNOSIS — R609 Edema, unspecified: Secondary | ICD-10-CM | POA: Diagnosis not present

## 2013-12-31 DIAGNOSIS — C50419 Malignant neoplasm of upper-outer quadrant of unspecified female breast: Secondary | ICD-10-CM | POA: Diagnosis not present

## 2013-12-31 DIAGNOSIS — R209 Unspecified disturbances of skin sensation: Secondary | ICD-10-CM | POA: Diagnosis not present

## 2013-12-31 NOTE — Progress Notes (Signed)
Weekly assessment of radiation of right breast.right axilla pain and left upper and lower jaw.Started on anti-biotic therapy for tooth pain.Continue radiaplex for hyperpigmented skin with out peeling.

## 2013-12-31 NOTE — Progress Notes (Signed)
Weekly Management Note Current Dose: 50.4  Gy  Projected Dose: 60.4 Gy   Narrative:  The patient presents for routine under treatment assessment.  CBCT/MVCT images/Port film x-rays were reviewed.  The chart was checked. Jaw is better. On antibiotics. Irritation around seroma cavity continues. Skin is sore over SCLV fossa. Using radiaplex.   Physical Findings: Weight: 234 lb 9.6 oz (106.414 kg). Dry desquamation over neck. Dermatitis over breast.   Impression:  The patient is tolerating radiation.  Plan:  Continue treatment as planned. Continue radiaplex. Follow up with dentistry.

## 2014-01-01 ENCOUNTER — Ambulatory Visit
Admission: RE | Admit: 2014-01-01 | Discharge: 2014-01-01 | Disposition: A | Discharge status: Home / Self Care | Payer: Medicare Other | Source: Ambulatory Visit | Attending: Radiation Oncology | Admitting: Radiation Oncology

## 2014-01-01 ENCOUNTER — Encounter: Payer: Self-pay | Admitting: Radiation Oncology

## 2014-01-01 ENCOUNTER — Ambulatory Visit: Payer: Medicare Other

## 2014-01-01 DIAGNOSIS — R209 Unspecified disturbances of skin sensation: Secondary | ICD-10-CM | POA: Diagnosis not present

## 2014-01-01 DIAGNOSIS — R609 Edema, unspecified: Secondary | ICD-10-CM | POA: Diagnosis not present

## 2014-01-01 DIAGNOSIS — Z170 Estrogen receptor positive status [ER+]: Secondary | ICD-10-CM | POA: Diagnosis not present

## 2014-01-01 DIAGNOSIS — Z510 Encounter for antineoplastic radiation therapy: Secondary | ICD-10-CM | POA: Diagnosis not present

## 2014-01-01 DIAGNOSIS — C50419 Malignant neoplasm of upper-outer quadrant of unspecified female breast: Secondary | ICD-10-CM | POA: Diagnosis not present

## 2014-01-01 DIAGNOSIS — M79609 Pain in unspecified limb: Secondary | ICD-10-CM | POA: Diagnosis not present

## 2014-01-01 NOTE — Progress Notes (Signed)
  Radiation Oncology         [phone] ________________________________  Name: Alicia Vasquez MRN: [id number]  Date: 01/01/2014  DOB: [birth year]  Simulation Verification Note  Status: outpatient  NARRATIVE: The patient was brought to the treatment unit and placed in the planned treatment position. The clinical setup was verified. Then port films were obtained and uploaded to the radiation oncology medical record software.  The treatment beams were carefully compared against the planned radiation fields. The position location and shape of the radiation fields was reviewed. The targeted volume of tissue appears appropriately covered by the radiation beams. Organs at risk appear to be excluded as planned.  Based on my personal review, I approved the simulation verification. The patient's treatment will proceed as planned.  ------------------------------------------------  Thea Silversmith, MD

## 2014-01-02 ENCOUNTER — Ambulatory Visit: Payer: Medicare Other | Admitting: Family Medicine

## 2014-01-02 ENCOUNTER — Ambulatory Visit
Admission: RE | Admit: 2014-01-02 | Discharge: 2014-01-02 | Disposition: A | Discharge status: Home / Self Care | Payer: Medicare Other | Source: Ambulatory Visit | Attending: Radiation Oncology | Admitting: Radiation Oncology

## 2014-01-02 VITALS — BP 136/54 | HR 83

## 2014-01-02 DIAGNOSIS — R209 Unspecified disturbances of skin sensation: Secondary | ICD-10-CM | POA: Diagnosis not present

## 2014-01-02 DIAGNOSIS — C50419 Malignant neoplasm of upper-outer quadrant of unspecified female breast: Secondary | ICD-10-CM | POA: Diagnosis not present

## 2014-01-02 DIAGNOSIS — Z510 Encounter for antineoplastic radiation therapy: Secondary | ICD-10-CM | POA: Diagnosis not present

## 2014-01-02 DIAGNOSIS — Z170 Estrogen receptor positive status [ER+]: Secondary | ICD-10-CM | POA: Diagnosis not present

## 2014-01-02 DIAGNOSIS — M79609 Pain in unspecified limb: Secondary | ICD-10-CM | POA: Diagnosis not present

## 2014-01-02 DIAGNOSIS — R609 Edema, unspecified: Secondary | ICD-10-CM | POA: Diagnosis not present

## 2014-01-02 DIAGNOSIS — C50411 Malignant neoplasm of upper-outer quadrant of right female breast: Secondary | ICD-10-CM

## 2014-01-02 MED ORDER — RADIAPLEXRX EX GEL
Freq: Once | CUTANEOUS | Status: AC
  Administered 2014-01-02: 15:00:00 via TOPICAL

## 2014-01-03 ENCOUNTER — Ambulatory Visit
Admission: RE | Admit: 2014-01-03 | Discharge: 2014-01-03 | Disposition: A | Discharge status: Home / Self Care | Payer: Medicare Other | Source: Ambulatory Visit | Attending: Radiation Oncology | Admitting: Radiation Oncology

## 2014-01-03 ENCOUNTER — Ambulatory Visit: Payer: Medicare Other

## 2014-01-03 ENCOUNTER — Ambulatory Visit: Payer: Medicare Other | Attending: Oncology | Admitting: Physical Therapy

## 2014-01-03 DIAGNOSIS — Z510 Encounter for antineoplastic radiation therapy: Secondary | ICD-10-CM | POA: Diagnosis not present

## 2014-01-03 DIAGNOSIS — M24519 Contracture, unspecified shoulder: Secondary | ICD-10-CM | POA: Insufficient documentation

## 2014-01-03 DIAGNOSIS — R609 Edema, unspecified: Secondary | ICD-10-CM | POA: Diagnosis not present

## 2014-01-03 DIAGNOSIS — M79609 Pain in unspecified limb: Secondary | ICD-10-CM | POA: Diagnosis not present

## 2014-01-03 DIAGNOSIS — C50919 Malignant neoplasm of unspecified site of unspecified female breast: Secondary | ICD-10-CM | POA: Diagnosis not present

## 2014-01-03 DIAGNOSIS — IMO0001 Reserved for inherently not codable concepts without codable children: Secondary | ICD-10-CM | POA: Diagnosis not present

## 2014-01-03 DIAGNOSIS — C50419 Malignant neoplasm of upper-outer quadrant of unspecified female breast: Secondary | ICD-10-CM | POA: Diagnosis not present

## 2014-01-03 DIAGNOSIS — R209 Unspecified disturbances of skin sensation: Secondary | ICD-10-CM | POA: Diagnosis not present

## 2014-01-03 DIAGNOSIS — Z170 Estrogen receptor positive status [ER+]: Secondary | ICD-10-CM | POA: Diagnosis not present

## 2014-01-04 ENCOUNTER — Encounter: Payer: Self-pay | Admitting: *Deleted

## 2014-01-06 ENCOUNTER — Ambulatory Visit
Admission: RE | Admit: 2014-01-06 | Discharge: 2014-01-06 | Disposition: A | Discharge status: Home / Self Care | Payer: Medicare Other | Source: Ambulatory Visit | Attending: Radiation Oncology | Admitting: Radiation Oncology

## 2014-01-06 ENCOUNTER — Ambulatory Visit: Payer: Medicare Other | Admitting: Physical Therapy

## 2014-01-06 ENCOUNTER — Ambulatory Visit: Payer: Medicare Other

## 2014-01-06 DIAGNOSIS — R209 Unspecified disturbances of skin sensation: Secondary | ICD-10-CM | POA: Diagnosis not present

## 2014-01-06 DIAGNOSIS — R609 Edema, unspecified: Secondary | ICD-10-CM | POA: Diagnosis not present

## 2014-01-06 DIAGNOSIS — C50419 Malignant neoplasm of upper-outer quadrant of unspecified female breast: Secondary | ICD-10-CM | POA: Diagnosis not present

## 2014-01-06 DIAGNOSIS — Z170 Estrogen receptor positive status [ER+]: Secondary | ICD-10-CM | POA: Diagnosis not present

## 2014-01-06 DIAGNOSIS — M79609 Pain in unspecified limb: Secondary | ICD-10-CM | POA: Diagnosis not present

## 2014-01-06 DIAGNOSIS — Z510 Encounter for antineoplastic radiation therapy: Secondary | ICD-10-CM | POA: Diagnosis not present

## 2014-01-07 ENCOUNTER — Encounter: Payer: Self-pay | Admitting: Radiation Oncology

## 2014-01-07 ENCOUNTER — Ambulatory Visit
Admission: RE | Admit: 2014-01-07 | Discharge: 2014-01-07 | Disposition: A | Discharge status: Home / Self Care | Payer: Medicare Other | Source: Ambulatory Visit | Attending: Radiation Oncology | Admitting: Radiation Oncology

## 2014-01-07 ENCOUNTER — Ambulatory Visit: Payer: Medicare Other

## 2014-01-07 DIAGNOSIS — C50411 Malignant neoplasm of upper-outer quadrant of right female breast: Secondary | ICD-10-CM

## 2014-01-07 DIAGNOSIS — C50419 Malignant neoplasm of upper-outer quadrant of unspecified female breast: Secondary | ICD-10-CM | POA: Diagnosis not present

## 2014-01-07 DIAGNOSIS — M79609 Pain in unspecified limb: Secondary | ICD-10-CM | POA: Diagnosis not present

## 2014-01-07 DIAGNOSIS — R609 Edema, unspecified: Secondary | ICD-10-CM | POA: Diagnosis not present

## 2014-01-07 DIAGNOSIS — R209 Unspecified disturbances of skin sensation: Secondary | ICD-10-CM | POA: Diagnosis not present

## 2014-01-07 DIAGNOSIS — Z510 Encounter for antineoplastic radiation therapy: Secondary | ICD-10-CM | POA: Diagnosis not present

## 2014-01-07 DIAGNOSIS — Z170 Estrogen receptor positive status [ER+]: Secondary | ICD-10-CM | POA: Diagnosis not present

## 2014-01-07 MED ORDER — ALRA NON-METALLIC DEODORANT (RAD-ONC)
1.0000 "application " | Freq: Once | TOPICAL | Status: AC
  Administered 2014-01-07: 1 via TOPICAL

## 2014-01-07 MED ORDER — RADIAPLEXRX EX GEL
Freq: Once | CUTANEOUS | Status: AC
  Administered 2014-01-07: 16:00:00 via TOPICAL

## 2014-01-07 NOTE — Progress Notes (Addendum)
  Radiation Oncology         [phone] ________________________________  Name: Alicia Vasquez MRN: [id number]  Date: 01/07/2014  DOB: 04-Nov-1940  End of Treatment Note  Diagnosis:   T1N1 Right Breast Cancer   Indication for treatment:  Curative       Radiation treatment dates:   11/21/2013-01/07/2014  Site/dose:    Right breast / 50.4 Gray @ 1.8 Pearline Cables per fraction x 28 fractions Right supraclavicular fossa and axilla / 50.4 Gy @1 .8 Gy per fraction x 28 fractions Right breast boost / 10 Gray at [employer] per fraction x 5 fractions  Beams/energy:  Opposed Tangents / 10 and 15 MV photons LAO and RPO / 10 MV photons Three field / 6  MV photons  Narrative: The patient tolerated radiation treatment relatively well.   She continued to see physical therapy throughout treatment. She had dry desquamation in the inframammary fold and SCLV fossa.  Plan: The patient has completed radiation treatment. The patient will return to radiation oncology clinic for routine followup in one month. I advised them to call or return sooner if they have any questions or concerns related to their recovery or treatment.  ------------------------------------------------  Thea Silversmith, MD

## 2014-01-08 ENCOUNTER — Ambulatory Visit (INDEPENDENT_AMBULATORY_CARE_PROVIDER_SITE_OTHER): Payer: Medicare Other | Admitting: *Deleted

## 2014-01-08 DIAGNOSIS — Z5181 Encounter for therapeutic drug level monitoring: Secondary | ICD-10-CM | POA: Diagnosis not present

## 2014-01-08 DIAGNOSIS — I82402 Acute embolism and thrombosis of unspecified deep veins of left lower extremity: Secondary | ICD-10-CM

## 2014-01-08 LAB — POCT INR: INR: 2.7

## 2014-01-09 ENCOUNTER — Ambulatory Visit: Payer: Medicare Other | Admitting: Physical Therapy

## 2014-01-09 DIAGNOSIS — IMO0001 Reserved for inherently not codable concepts without codable children: Secondary | ICD-10-CM | POA: Diagnosis not present

## 2014-01-14 ENCOUNTER — Ambulatory Visit: Payer: Medicare Other | Admitting: Physical Therapy

## 2014-01-14 DIAGNOSIS — IMO0001 Reserved for inherently not codable concepts without codable children: Secondary | ICD-10-CM | POA: Diagnosis not present

## 2014-01-16 ENCOUNTER — Ambulatory Visit: Payer: Medicare Other | Admitting: Physical Therapy

## 2014-01-16 DIAGNOSIS — IMO0001 Reserved for inherently not codable concepts without codable children: Secondary | ICD-10-CM | POA: Diagnosis not present

## 2014-01-20 NOTE — Progress Notes (Signed)
Name: Alicia Vasquez   MRN: [id number]  Date:  12/26/13   DOB: Apr 22, 1941  Status:outpatient    DIAGNOSIS: Right breast cancer  CONSENT VERIFIED: yes   SET UP: Patient is setup supine   IMMOBILIZATION:  The following immobilization was used:Custom Moldable Pillow, breast board.   NARRATIVE: Alicia Vasquez underwent complex simulation and treatment planning for her boost treatment today.  Her tumor volume was outlined on the planning CT scan.  Due to the depth of her cavity, electrons could not be used and a photon plan was developed. The plan will be prescribed to the 97% isodose line.   I personally supervised and approved the creation of 3 unique MLCs comprising 3   treatment devices.  Dose calculations and isodose plan are requested.

## 2014-01-20 NOTE — Addendum Note (Signed)
Encounter addended by: Thea Silversmith, MD on: 01/20/2014  6:42 PM<BR>     Documentation filed: Notes Section

## 2014-01-21 ENCOUNTER — Ambulatory Visit: Payer: Medicare Other | Admitting: Physical Therapy

## 2014-01-21 DIAGNOSIS — IMO0001 Reserved for inherently not codable concepts without codable children: Secondary | ICD-10-CM | POA: Diagnosis not present

## 2014-01-23 ENCOUNTER — Ambulatory Visit: Payer: Medicare Other | Admitting: Physical Therapy

## 2014-01-23 DIAGNOSIS — IMO0001 Reserved for inherently not codable concepts without codable children: Secondary | ICD-10-CM | POA: Diagnosis not present

## 2014-01-27 ENCOUNTER — Ambulatory Visit: Payer: Medicare Other | Admitting: Physical Therapy

## 2014-01-27 DIAGNOSIS — IMO0001 Reserved for inherently not codable concepts without codable children: Secondary | ICD-10-CM | POA: Diagnosis not present

## 2014-01-28 ENCOUNTER — Ambulatory Visit: Payer: Medicare Other | Attending: Oncology | Admitting: Physical Therapy

## 2014-01-28 DIAGNOSIS — IMO0001 Reserved for inherently not codable concepts without codable children: Secondary | ICD-10-CM | POA: Insufficient documentation

## 2014-01-28 DIAGNOSIS — M24519 Contracture, unspecified shoulder: Secondary | ICD-10-CM | POA: Insufficient documentation

## 2014-01-28 DIAGNOSIS — C50919 Malignant neoplasm of unspecified site of unspecified female breast: Secondary | ICD-10-CM | POA: Diagnosis not present

## 2014-01-29 ENCOUNTER — Other Ambulatory Visit: Payer: Self-pay | Admitting: Emergency Medicine

## 2014-01-29 DIAGNOSIS — C50411 Malignant neoplasm of upper-outer quadrant of right female breast: Secondary | ICD-10-CM

## 2014-01-30 ENCOUNTER — Other Ambulatory Visit (HOSPITAL_BASED_OUTPATIENT_CLINIC_OR_DEPARTMENT_OTHER): Payer: Medicare Other

## 2014-01-30 ENCOUNTER — Ambulatory Visit (HOSPITAL_BASED_OUTPATIENT_CLINIC_OR_DEPARTMENT_OTHER): Payer: Medicare Other | Admitting: Oncology

## 2014-01-30 ENCOUNTER — Telehealth: Payer: Self-pay | Admitting: Oncology

## 2014-01-30 VITALS — BP 149/93 | HR 78 | Temp 97.8°F | Resp 18 | Ht 63.5 in | Wt 233.0 lb

## 2014-01-30 DIAGNOSIS — C50411 Malignant neoplasm of upper-outer quadrant of right female breast: Secondary | ICD-10-CM

## 2014-01-30 DIAGNOSIS — C773 Secondary and unspecified malignant neoplasm of axilla and upper limb lymph nodes: Secondary | ICD-10-CM

## 2014-01-30 DIAGNOSIS — E038 Other specified hypothyroidism: Secondary | ICD-10-CM

## 2014-01-30 DIAGNOSIS — I82402 Acute embolism and thrombosis of unspecified deep veins of left lower extremity: Secondary | ICD-10-CM

## 2014-01-30 DIAGNOSIS — I10 Essential (primary) hypertension: Secondary | ICD-10-CM

## 2014-01-30 DIAGNOSIS — Z170 Estrogen receptor positive status [ER+]: Secondary | ICD-10-CM

## 2014-01-30 DIAGNOSIS — C50419 Malignant neoplasm of upper-outer quadrant of unspecified female breast: Secondary | ICD-10-CM

## 2014-01-30 LAB — COMPREHENSIVE METABOLIC PANEL (CC13)
ALBUMIN: 3.8 g/dL (ref 3.5–5.0)
ALT: 26 U/L (ref 0–55)
ANION GAP: 7 meq/L (ref 3–11)
AST: 17 U/L (ref 5–34)
Alkaline Phosphatase: 87 U/L (ref 40–150)
BILIRUBIN TOTAL: 0.32 mg/dL (ref 0.20–1.20)
BUN: 12.4 mg/dL (ref 7.0–26.0)
CHLORIDE: 106 meq/L (ref 98–109)
CO2: 27 meq/L (ref 22–29)
Calcium: 10 mg/dL (ref 8.4–10.4)
Creatinine: 0.8 mg/dL (ref 0.6–1.1)
GLUCOSE: 90 mg/dL (ref 70–140)
POTASSIUM: 4.3 meq/L (ref 3.5–5.1)
SODIUM: 140 meq/L (ref 136–145)
TOTAL PROTEIN: 7.2 g/dL (ref 6.4–8.3)

## 2014-01-30 LAB — CBC WITH DIFFERENTIAL/PLATELET
BASO%: 1.1 % (ref 0.0–2.0)
Basophils Absolute: 0.1 10*3/uL (ref 0.0–0.1)
EOS ABS: 0.2 10*3/uL (ref 0.0–0.5)
EOS%: 3.1 % (ref 0.0–7.0)
HEMATOCRIT: 44.4 % (ref 34.8–46.6)
HGB: 14.6 g/dL (ref 11.6–15.9)
LYMPH#: 1.1 10*3/uL (ref 0.9–3.3)
LYMPH%: 17.3 % (ref 14.0–49.7)
MCH: 31 pg (ref 25.1–34.0)
MCHC: 33 g/dL (ref 31.5–36.0)
MCV: 94 fL (ref 79.5–101.0)
MONO#: 1.3 10*3/uL — AB (ref 0.1–0.9)
MONO%: 19.1 % — ABNORMAL HIGH (ref 0.0–14.0)
NEUT%: 59.4 % (ref 38.4–76.8)
NEUTROS ABS: 3.9 10*3/uL (ref 1.5–6.5)
Platelets: 229 10*3/uL (ref 145–400)
RBC: 4.72 10*6/uL (ref 3.70–5.45)
RDW: 14.8 % — ABNORMAL HIGH (ref 11.2–14.5)
WBC: 6.6 10*3/uL (ref 3.9–10.3)

## 2014-01-30 MED ORDER — ANASTROZOLE 1 MG PO TABS: 1.0000 mg | ORAL_TABLET | Freq: Every day | ORAL | Status: DC

## 2014-01-30 NOTE — Telephone Encounter (Signed)
per pof to sch appt-gave pt copy of sch °

## 2014-01-30 NOTE — Progress Notes (Signed)
[place]  [phone] [phone]     ID: Alicia Vasquez OB: Jul 05, 1940  MR#: [id number]  VXY#:[id number]  PCP: [name], MD GYN:   SU: [name] OTHER MD: Thea Silversmith, Hart Robinsons  CHIEF COMPLAINT: Estrogen receptor positive breast cancer  CURRENT TREATMENT: To start anastrozole  BREAST CANCER HISTORY: From doctor [name]'s intake node 07/24/2013:  "73 y.o. female. Who presented with SOB and had a CT chest performed that revealed a right breast mass. Mammogram/ultrsound on 2/13 showed a mass in the 11 o'clock position in the right breast measuring 1.5 cm. Also noted was a right axillary LN measuring 1.6 cm. MRI not performed. Biopsy of mass and lymph done. 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 Lymph node + for metastatic carcinoma."  [On 09/09/2013 the patient underwent right lumpectomy and sentinel lymph node sampling. This showed (SZA [phone]) multifocal invasive ductal carcinoma, grade 1, the largest lesion measuring 1.8 cm, the second lesion 1.2 cm. One of 4 sentinel lymph nodes was positive, with extracapsular extension. Margins were positive. HER-2 was repeated and was again negative. Further surgery 09/16/2013 obtained clear margins.  Her subsequent history is as detailed below  INTERVAL HISTORY: Alicia Vasquez returns today for followup of her breast cancer. Since her last visit here she completed her radiation treatments. Aside from fatigue she had significant desquamation. She is benefiting from rehabilitation both in terms of right upper extremity and right knee mobility and terms of overall exercise tolerance  REVIEW OF SYSTEMS: She is still fairly fatigued him a but is able to now do 20 minutes on the stationary bike, or as before she could only do 10. She has numbness in the right axilla and medial aspect of  the right upper extremity. She understands this may be permanent. She does have soreness though also in the right breast. She describes this as having an aching. It is very intermittent. Sometimes her vision seems blurred. She is a little bit hoarse. She has significant gum disease, and she is planning to have her few remaining teeth pulled. She short of breath when walking up stairs or walking significant distances on a flat surface. She describes her appetite is poor. Her bowel movements Niro little bit more like "little hard balls". Occasionally she has abdominal cramps. This is very rare. Also rare are symptoms of dysuria. She does not have hematuria. She bruises easily but reports no overt bleeding. A detailed review of systems today was otherwise stable  PAST MEDICAL HISTORY: Past Medical History  Diagnosis Date  . Allergy   . Hypertension   . Hypothyroid   . Blood transfusion without reported diagnosis   . Pneumonia   . DVT (deep vein thrombosis) in pregnancy   . Breast cancer 07/12/13    invasive mammary carcinoma  . Anxiety   . Arthritis     PAST SURGICAL HISTORY: Past Surgical History  Procedure Laterality Date  . Removal of teeth  04/19/2012    13 teeth removed  . Biospy      female organs  . Dilation and curettage of uterus    . Breast lumpectomy with radioactive seed localization Right 09/09/2013    Procedure: BREAST LUMPECTOMY WITH RADIOACTIVE SEED LOCALIZATION WITH AXILLARY NODE EXCISION;  Surgeon: [name], MD;  Location: [facility];  Service: General;  Laterality: Right;  . Re-excision of breast lumpectomy Right 09/24/2013  Procedure: RE-EXCISION OF RIGHT BREAST LUMPECTOMY;  Surgeon: [name], MD;  Location: [facility];  Service: General;  Laterality: Right;    FAMILY HISTORY Family History  Problem Relation Age of Onset  . Cancer Brother     prostate  . Heart disease Brother   . Colon cancer Brother    the  patient's father died at the age of 23 after an automobile accident. The patient's mother died at the age of 22. She was a Marine scientist here in [name] in the old [facility] She was infected with polio and was confined to a wheelchair for a good part of her life. She eventually died of pneumonia. The patient had one brother, who died with prostate cancer. She had no sisters. There is no history of breast or ovarian cancer in the family.  GYNECOLOGIC HISTORY:  Menarche age 69, first live birth age 30, the patient is GX P1. She went through the change of life at age 29. 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 She is widowed. Currently she is staying with her friend [name], who is a retired Radio producer. The patient's son frank lives in [place]. He works an Engineer, technical sales. The patient has no grandchildren. She is a Tourist information centre manager but currently attends a [employer] with her friend [name]    ADVANCED DIRECTIVES:    HEALTH MAINTENANCE: History  Substance Use Topics  . Smoking status: Never Smoker   . Smokeless tobacco: Not on file  . Alcohol Use: No     Colonoscopy: Never  PAP:  Bone density: 10/01/2009; lowest T score -0.8  Lipid panel:  Allergies  Allergen Reactions  . Anesthetics, Amide Other (See Comments)    ELEVATE BLOOD PRESSURE  . Benadryl [Diphenhydramine Hcl]     dizziness  . Carbocaine [Mepivacaine Hcl] Other (See Comments)    Elevated blood pressure  . Codeine Nausea Only and Other (See Comments)    dizziness  . Epinephrine Other (See Comments)    Elevated blood pressure  . Sulfa Antibiotics Other (See Comments)    dizziness  . Vicodin [Hydrocodone-Acetaminophen] Nausea Only  . Penicillins Rash    Current Outpatient Prescriptions  Medication Sig Dispense Refill  . acetaminophen (TYLENOL) 500 MG tablet Take 500 mg by mouth every 6 (six) hours as needed for mild pain, fever or headache.       . anastrozole (ARIMIDEX) 1 MG tablet Take 1 tablet (1 mg total) by mouth daily.  90 tablet  4  . hyaluronate sodium (RADIAPLEXRX) GEL Apply 1 application topically 2 (two) times daily. Apply 2x day after rad tx and bedtime daily and on weekends on rt breast      . levothyroxine (SYNTHROID) 100 MCG tablet Take 1 tablet (100 mcg total) by mouth daily.  30 tablet  11  . lisinopril (PRINIVIL,ZESTRIL) 10 MG tablet Take 1 tablet (10 mg total) by mouth daily.  30 tablet  11  . non-metallic deodorant (ALRA) MISC Apply 1 application topically daily as needed.      Marland Kitchen oxyCODONE (OXY IR/ROXICODONE) 5 MG immediate release tablet Take 1 tablet (5 mg total) by mouth every 6 (six) hours as needed.  30 tablet  0  . PRESCRIPTION MEDICATION Clindamycin 150 mg taking 2 tabs every 6 hrs for 2 days, then 1 tab every 6 hrs until finished      . warfarin (COUMADIN) 5 MG tablet TAKE AS DIRECTED BY  ANTICOAGULATION CLINIC  60 tablet  3   No current facility-administered medications for this visit.    OBJECTIVE: Older white woman who appears stated age 67 Vitals:   01/30/14 1155  BP: 149/93  Pulse: 78  Temp: 97.8 F (36.6 C)  Resp: 18     Body mass index is 40.62 kg/(m^2).    ECOG FS:1 - Symptomatic but completely ambulatory  Ocular: Sclerae unicteric, EOMs intact Ear-nose-throat: Oropharynx clear, multiple missing teeth Lymphatic: No cervical or supraclavicular adenopathy Lungs no rales or rhonchi Heart regular rate and rhythm Abd soft, obese, nontender, positive bowel sounds MSK no focal spinal tenderness, minimal right upper extremity lymphedema Neuro: non-focal, well-oriented, appropriate affect Breasts: The right breast is status post lumpectomy and radiation. There is minimal residual erythema. There is no active desquamation. The right axilla is benign. The left breast is unremarkable   LAB RESULTS:  CMP     Component Value Date/Time   NA 140 01/30/2014 1128   NA 137 12/26/2013 1238   K 4.3  01/30/2014 1128   K 4.3 12/26/2013 1238   CL 104 12/26/2013 1238   CO2 27 01/30/2014 1128   CO2 28 12/26/2013 1238   GLUCOSE 90 01/30/2014 1128   GLUCOSE 91 12/26/2013 1238   BUN 12.4 01/30/2014 1128   BUN 15 12/26/2013 1238   CREATININE 0.8 01/30/2014 1128   CREATININE 0.70 12/26/2013 1238   CREATININE 0.77 09/05/2013 1330   CALCIUM 10.0 01/30/2014 1128   CALCIUM 9.7 12/26/2013 1238   PROT 7.2 01/30/2014 1128   PROT 6.5 12/26/2013 1238   ALBUMIN 3.8 01/30/2014 1128   ALBUMIN 3.9 12/26/2013 1238   AST 17 01/30/2014 1128   AST 15 12/26/2013 1238   ALT 26 01/30/2014 1128   ALT 17 12/26/2013 1238   ALKPHOS 87 01/30/2014 1128   ALKPHOS 73 12/26/2013 1238   BILITOT 0.32 01/30/2014 1128   BILITOT 0.5 12/26/2013 1238   GFRNONAA 72 10/10/2013 1159   GFRNONAA 81* 09/05/2013 1330   GFRAA 83 10/10/2013 1159   GFRAA >90 09/05/2013 1330    I No results found for this basename: SPEP,  UPEP,   kappa and lambda light chains    Lab Results  Component Value Date   WBC 6.6 01/30/2014   NEUTROABS 3.9 01/30/2014   HGB 14.6 01/30/2014   HCT 44.4 01/30/2014   MCV 94.0 01/30/2014   PLT 229 01/30/2014      Chemistry      Component Value Date/Time   NA 140 01/30/2014 1128   NA 137 12/26/2013 1238   K 4.3 01/30/2014 1128   K 4.3 12/26/2013 1238   CL 104 12/26/2013 1238   CO2 27 01/30/2014 1128   CO2 28 12/26/2013 1238   BUN 12.4 01/30/2014 1128   BUN 15 12/26/2013 1238   CREATININE 0.8 01/30/2014 1128   CREATININE 0.70 12/26/2013 1238   CREATININE 0.77 09/05/2013 1330      Component Value Date/Time   CALCIUM 10.0 01/30/2014 1128   CALCIUM 9.7 12/26/2013 1238   ALKPHOS 87 01/30/2014 1128   ALKPHOS 73 12/26/2013 1238   AST 17 01/30/2014 1128   AST 15 12/26/2013 1238   ALT 26 01/30/2014 1128   ALT 17 12/26/2013 1238   BILITOT 0.32 01/30/2014 1128   BILITOT 0.5 12/26/2013 1238       No results found for this basename: LABCA2    No components found with this basename: LABCA125    No results found for this basename: INR,  in the last 168  hours  Urinalysis    Component Value Date/Time   BILIRUBINUR neg 08/20/2013 1322   PROTEINUR neg 08/20/2013 1322   UROBILINOGEN 0.2 08/20/2013 1322   NITRITE neg 08/20/2013 1322   LEUKOCYTESUR Negative 08/20/2013 1322    STUDIES: No results found.  ASSESSMENT: 73 y.o. [place] woman status post right lumpectomy and sentinel lymph node sampling 09/09/2013 for an mpT1c pN1a, stage IIA invasive ductal carcinoma, estrogen and progesterone receptor both 100% positive with strong staining intensity, MIB-1 of 17% and no HER-2 amplification  (1) additional surgery for margin clearance 09/16/2013 obtained negative margins  (2) Oncotype DX recurrence score of 4 predicts a risk of outside the breast recurrence within 10 years of 7% if the patient's only systemic therapy is tamoxifen for 5 years. It also predicts no benefit from chemotherapy  (3) additional right breast surgery for margin clearance was successful, 09/16/2048  (4) adjuvant radiation completed 01/07/2014  (5) anastrozole to be started 02/27/2014; bone density may 2011 was normal  PLAN: Erandi has completed her local treatment, namely surgery and radiation. She is now ready to start systemic therapy, which will consist of anastrozole daily for 5 years. We discussed the possible toxicities, side effects and complications of this agent. I would prefer that she not started until she has recovered a little bit more fully from her radiation. The target starting day 8 will be October 1.  She had a normal bone density 4 years ago. Once she is stable on the anastrozole we will obtain a new baseline bone density. Today I did suggest she start vitamin D supplementation.  She is doing some cardio exercises, but not walking as much as I would like. I gave her a copy of the [employer] information so she signed up for that program. I think he would make an and normal is depressed her sense of well-being.  Otherwise she will be following up with her  surgeon in December and she will see me again in 6 months. She knows to call for any problems that may develop before that visit.  Chauncey Cruel, MD   01/30/2014 12:22 PM

## 2014-02-04 ENCOUNTER — Ambulatory Visit: Payer: Medicare Other | Admitting: Physical Therapy

## 2014-02-04 DIAGNOSIS — IMO0001 Reserved for inherently not codable concepts without codable children: Secondary | ICD-10-CM | POA: Diagnosis not present

## 2014-02-05 ENCOUNTER — Ambulatory Visit (INDEPENDENT_AMBULATORY_CARE_PROVIDER_SITE_OTHER): Payer: Medicare Other | Admitting: *Deleted

## 2014-02-05 DIAGNOSIS — I82402 Acute embolism and thrombosis of unspecified deep veins of left lower extremity: Secondary | ICD-10-CM

## 2014-02-05 DIAGNOSIS — I82409 Acute embolism and thrombosis of unspecified deep veins of unspecified lower extremity: Secondary | ICD-10-CM

## 2014-02-05 DIAGNOSIS — Z5181 Encounter for therapeutic drug level monitoring: Secondary | ICD-10-CM

## 2014-02-05 LAB — POCT INR: INR: 4.7

## 2014-02-06 ENCOUNTER — Ambulatory Visit: Payer: Medicare Other | Admitting: Physical Therapy

## 2014-02-06 ENCOUNTER — Ambulatory Visit (INDEPENDENT_AMBULATORY_CARE_PROVIDER_SITE_OTHER): Payer: Medicare Other | Admitting: Family Medicine

## 2014-02-06 ENCOUNTER — Encounter: Payer: Self-pay | Admitting: Family Medicine

## 2014-02-06 VITALS — BP 128/74 | HR 76 | Temp 98.0°F | Resp 16 | Ht 64.5 in | Wt 234.4 lb

## 2014-02-06 DIAGNOSIS — M79605 Pain in left leg: Secondary | ICD-10-CM

## 2014-02-06 DIAGNOSIS — R5383 Other fatigue: Secondary | ICD-10-CM

## 2014-02-06 DIAGNOSIS — M79609 Pain in unspecified limb: Secondary | ICD-10-CM | POA: Diagnosis not present

## 2014-02-06 DIAGNOSIS — R5381 Other malaise: Secondary | ICD-10-CM

## 2014-02-06 DIAGNOSIS — M79601 Pain in right arm: Secondary | ICD-10-CM

## 2014-02-06 DIAGNOSIS — I809 Phlebitis and thrombophlebitis of unspecified site: Secondary | ICD-10-CM | POA: Diagnosis not present

## 2014-02-06 DIAGNOSIS — R079 Chest pain, unspecified: Secondary | ICD-10-CM

## 2014-02-06 DIAGNOSIS — E039 Hypothyroidism, unspecified: Secondary | ICD-10-CM

## 2014-02-06 DIAGNOSIS — IMO0001 Reserved for inherently not codable concepts without codable children: Secondary | ICD-10-CM | POA: Diagnosis not present

## 2014-02-06 NOTE — Patient Instructions (Addendum)
I am concerned about the leg pain and the fatigue.  We'll check these I'm thinking the dental problem is responsible for the swollen glands on your left neck.

## 2014-02-06 NOTE — Progress Notes (Signed)
73 yo widowed woman in right breast cancer recovery.  She complains of fatigue, persistent hoarseness, poor dental condition and right chest pain.    Yesterday her INR was over 4, so she is holding her coumadin and will follow up at the coumadin clinic soon.  She's living with a friend.  Husband was a concert violinist.  She has son Producer, television/film/video) and daughter (violinist)  Taking 1/2 pain  pill at hs.  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  Mildly tender left calf, good leg ROM, no skin changes on legs.  No edema.  Assessment:  The fatigue continues to be a problem.  The hoarseness is also suggestive of hypothyroidism, with progressively rising TSH this year.  She is very relieved to have gotten into cancer remission and thanks her wonderful doctors.   I'm concerned with the continued left leg pain and chest pain as well. Overall, though, patient is doing well.  Plan: Unspecified hypothyroidism - Plan: TSH  Phlebitis - Plan: Lower Extremity Venous Duplex Left  She needs urgent appointment with oral surgeron and already has referral  We'll also do venous doppler of the right upper extremity to evaluate for phlebitis.   Signed, [name], MD

## 2014-02-07 ENCOUNTER — Other Ambulatory Visit: Payer: Self-pay | Admitting: Family Medicine

## 2014-02-07 DIAGNOSIS — E039 Hypothyroidism, unspecified: Secondary | ICD-10-CM

## 2014-02-07 LAB — TSH: TSH: 5.123 u[IU]/mL — ABNORMAL HIGH (ref 0.350–4.500)

## 2014-02-07 MED ORDER — LEVOTHYROXINE SODIUM 125 MCG PO TABS: 125.0000 ug | ORAL_TABLET | Freq: Every day | ORAL | Status: DC

## 2014-02-10 ENCOUNTER — Ambulatory Visit: Payer: Medicare Other | Admitting: Physical Therapy

## 2014-02-10 DIAGNOSIS — IMO0001 Reserved for inherently not codable concepts without codable children: Secondary | ICD-10-CM | POA: Diagnosis not present

## 2014-02-11 ENCOUNTER — Ambulatory Visit: Payer: Medicare Other | Admitting: Physical Therapy

## 2014-02-11 DIAGNOSIS — IMO0001 Reserved for inherently not codable concepts without codable children: Secondary | ICD-10-CM | POA: Diagnosis not present

## 2014-02-12 ENCOUNTER — Other Ambulatory Visit (HOSPITAL_COMMUNITY): Payer: Self-pay | Admitting: Family Medicine

## 2014-02-12 ENCOUNTER — Ambulatory Visit (HOSPITAL_COMMUNITY)
Admission: RE | Admit: 2014-02-12 | Discharge: 2014-02-12 | Disposition: A | Discharge status: Home / Self Care | Payer: Medicare Other | Source: Ambulatory Visit | Attending: Vascular Surgery | Admitting: Vascular Surgery

## 2014-02-12 ENCOUNTER — Ambulatory Visit (HOSPITAL_COMMUNITY)
Admission: RE | Admit: 2014-02-12 | Discharge: 2014-02-12 | Disposition: A | Discharge status: Home / Self Care | Payer: Medicare Other | Source: Ambulatory Visit

## 2014-02-12 DIAGNOSIS — I809 Phlebitis and thrombophlebitis of unspecified site: Secondary | ICD-10-CM | POA: Insufficient documentation

## 2014-02-12 DIAGNOSIS — M79609 Pain in unspecified limb: Secondary | ICD-10-CM

## 2014-02-12 DIAGNOSIS — M79601 Pain in right arm: Secondary | ICD-10-CM

## 2014-02-12 DIAGNOSIS — I808 Phlebitis and thrombophlebitis of other sites: Secondary | ICD-10-CM

## 2014-02-13 ENCOUNTER — Ambulatory Visit
Admission: RE | Admit: 2014-02-13 | Discharge: 2014-02-13 | Disposition: A | Discharge status: Home / Self Care | Payer: Medicare Other | Source: Ambulatory Visit | Attending: Radiation Oncology | Admitting: Radiation Oncology

## 2014-02-13 VITALS — BP 120/85 | HR 83 | Temp 97.6°F | Wt 234.9 lb

## 2014-02-13 DIAGNOSIS — C50411 Malignant neoplasm of upper-outer quadrant of right female breast: Secondary | ICD-10-CM

## 2014-02-13 NOTE — Progress Notes (Addendum)
Department of Radiation Oncology  Phone:  [phone] Fax:        [phone]   Date: 02/13/2014  Follow Up Visit Note  Diagnosis: T1N1 Right breast cancer  Summary and Interval since last radiation: 1 month from 60.4 Gy to the right breast  Interval History: Alicia Vasquez presents today for routine followup.  She continues to have pain under her arm and in her breast. She is up to 20 minutes a day on the bike with physical therapy although she feels much better. Her PCP adjusted her thyroid medications. She had an ultrasound of her neck and thyroid. She starts anastrazole on October 1. She is using radiaplex on the right breast.She is supposed to see Dr. Donne Hazel in December and Dr. Jana Hakim in March.   Allergies: No Known Allergies  Physical Exam: Skin is healed up well over the right breast. Some hyperpigmentation.   IMPRESSION: Alicia Vasquez is a 73 y.o. female s/p breast conservation with resolving acute effects of treatment.   PLAN:  She is doing well. We discussed the need for follow up every 4-6 months which she has scheduled.  We discussed the need for yearly mammograms which she can schedule with surgery or with medical oncology. We discussed the need for sun protection in the treated area.  She can always call me with questions.  I will follow up with her on an as needed basis. I encouraged her to continue physical therapy and gave her information on the [employer] program. We discussed that it can take months for her nerves to heal and she amy always have pain that right breast.      Thea Silversmith, MD

## 2014-02-18 ENCOUNTER — Ambulatory Visit: Payer: Medicare Other | Admitting: Physical Therapy

## 2014-02-18 DIAGNOSIS — IMO0001 Reserved for inherently not codable concepts without codable children: Secondary | ICD-10-CM | POA: Diagnosis not present

## 2014-02-19 ENCOUNTER — Ambulatory Visit (INDEPENDENT_AMBULATORY_CARE_PROVIDER_SITE_OTHER): Payer: Medicare Other | Admitting: *Deleted

## 2014-02-19 DIAGNOSIS — I82409 Acute embolism and thrombosis of unspecified deep veins of unspecified lower extremity: Secondary | ICD-10-CM

## 2014-02-19 DIAGNOSIS — Z5181 Encounter for therapeutic drug level monitoring: Secondary | ICD-10-CM

## 2014-02-19 DIAGNOSIS — I82402 Acute embolism and thrombosis of unspecified deep veins of left lower extremity: Secondary | ICD-10-CM

## 2014-02-19 LAB — POCT INR: INR: 2.4

## 2014-02-20 ENCOUNTER — Ambulatory Visit: Payer: Medicare Other | Admitting: Physical Therapy

## 2014-02-20 DIAGNOSIS — IMO0001 Reserved for inherently not codable concepts without codable children: Secondary | ICD-10-CM | POA: Diagnosis not present

## 2014-02-25 ENCOUNTER — Ambulatory Visit: Payer: Medicare Other | Admitting: Physical Therapy

## 2014-03-03 ENCOUNTER — Telehealth: Payer: Self-pay

## 2014-03-03 DIAGNOSIS — Z86711 Personal history of pulmonary embolism: Secondary | ICD-10-CM

## 2014-03-03 NOTE — Telephone Encounter (Signed)
Spoke to pt- she states that she needs to have another order for her coumadin clinic it seems the order has expired.  Did not see where we originated the order. Please advise

## 2014-03-03 NOTE — Telephone Encounter (Signed)
PT STATES SHE HAD A "STUDY" DONE AND WE NEVER GAVE HER THE RESULTS PT WOULD LIKE TO DISCUSS WITH SOMEONE THESE RESULTS

## 2014-03-09 ENCOUNTER — Other Ambulatory Visit: Payer: Self-pay | Admitting: Family Medicine

## 2014-03-09 NOTE — Progress Notes (Unsigned)
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  Staying on coumadin should ultimately correct this and the swelling should slowly diminish over the next 3 months.

## 2014-03-10 ENCOUNTER — Other Ambulatory Visit: Payer: Self-pay | Admitting: *Deleted

## 2014-03-10 ENCOUNTER — Other Ambulatory Visit: Payer: Self-pay | Admitting: Family Medicine

## 2014-03-10 DIAGNOSIS — Z86711 Personal history of pulmonary embolism: Secondary | ICD-10-CM

## 2014-03-10 DIAGNOSIS — Z7901 Long term (current) use of anticoagulants: Secondary | ICD-10-CM

## 2014-03-10 NOTE — Telephone Encounter (Signed)
The computer does not allow me to refill the coumadin clinic order for some reason.  I would like to refer to [place] clinic for coumadin monitoring but am unable to do so.

## 2014-03-10 NOTE — Progress Notes (Signed)
FYI Dr Lauenstein 

## 2014-03-11 ENCOUNTER — Ambulatory Visit: Payer: Medicare Other | Attending: Oncology | Admitting: Physical Therapy

## 2014-03-11 DIAGNOSIS — I890 Lymphedema, not elsewhere classified: Secondary | ICD-10-CM | POA: Diagnosis not present

## 2014-03-11 DIAGNOSIS — M549 Dorsalgia, unspecified: Secondary | ICD-10-CM | POA: Insufficient documentation

## 2014-03-11 DIAGNOSIS — M24511 Contracture, right shoulder: Secondary | ICD-10-CM | POA: Insufficient documentation

## 2014-03-11 DIAGNOSIS — C50911 Malignant neoplasm of unspecified site of right female breast: Secondary | ICD-10-CM | POA: Diagnosis not present

## 2014-03-11 DIAGNOSIS — Z9889 Other specified postprocedural states: Secondary | ICD-10-CM | POA: Diagnosis not present

## 2014-03-11 DIAGNOSIS — M542 Cervicalgia: Secondary | ICD-10-CM | POA: Diagnosis not present

## 2014-03-12 ENCOUNTER — Ambulatory Visit (INDEPENDENT_AMBULATORY_CARE_PROVIDER_SITE_OTHER): Payer: Medicare Other | Admitting: *Deleted

## 2014-03-12 DIAGNOSIS — I82402 Acute embolism and thrombosis of unspecified deep veins of left lower extremity: Secondary | ICD-10-CM

## 2014-03-12 DIAGNOSIS — Z5181 Encounter for therapeutic drug level monitoring: Secondary | ICD-10-CM | POA: Diagnosis not present

## 2014-03-12 LAB — POCT INR: INR: 3.9

## 2014-03-13 ENCOUNTER — Ambulatory Visit: Payer: Medicare Other | Admitting: Family Medicine

## 2014-03-17 ENCOUNTER — Ambulatory Visit (INDEPENDENT_AMBULATORY_CARE_PROVIDER_SITE_OTHER): Payer: Medicare Other | Admitting: Family Medicine

## 2014-03-17 ENCOUNTER — Encounter: Payer: Self-pay | Admitting: Family Medicine

## 2014-03-17 VITALS — BP 154/82 | HR 96 | Temp 98.1°F | Resp 16 | Ht 64.5 in | Wt 231.8 lb

## 2014-03-17 DIAGNOSIS — M791 Myalgia: Secondary | ICD-10-CM | POA: Diagnosis not present

## 2014-03-17 DIAGNOSIS — R5382 Chronic fatigue, unspecified: Secondary | ICD-10-CM | POA: Diagnosis not present

## 2014-03-17 DIAGNOSIS — L989 Disorder of the skin and subcutaneous tissue, unspecified: Secondary | ICD-10-CM

## 2014-03-17 DIAGNOSIS — IMO0001 Reserved for inherently not codable concepts without codable children: Secondary | ICD-10-CM

## 2014-03-17 DIAGNOSIS — M609 Myositis, unspecified: Secondary | ICD-10-CM | POA: Diagnosis not present

## 2014-03-17 DIAGNOSIS — K047 Periapical abscess without sinus: Secondary | ICD-10-CM | POA: Diagnosis not present

## 2014-03-17 DIAGNOSIS — E039 Hypothyroidism, unspecified: Secondary | ICD-10-CM | POA: Diagnosis not present

## 2014-03-17 DIAGNOSIS — C50911 Malignant neoplasm of unspecified site of right female breast: Secondary | ICD-10-CM

## 2014-03-17 DIAGNOSIS — I82402 Acute embolism and thrombosis of unspecified deep veins of left lower extremity: Secondary | ICD-10-CM

## 2014-03-17 DIAGNOSIS — I82409 Acute embolism and thrombosis of unspecified deep veins of unspecified lower extremity: Secondary | ICD-10-CM | POA: Diagnosis not present

## 2014-03-17 LAB — COMPREHENSIVE METABOLIC PANEL
ALT: 19 U/L (ref 0–35)
AST: 16 U/L (ref 0–37)
Albumin: 4 g/dL (ref 3.5–5.2)
Alkaline Phosphatase: 69 U/L (ref 39–117)
BUN: 11 mg/dL (ref 6–23)
CO2: 26 mEq/L (ref 19–32)
Calcium: 9.6 mg/dL (ref 8.4–10.5)
Chloride: 106 mEq/L (ref 96–112)
Creat: 0.67 mg/dL (ref 0.50–1.10)
Glucose, Bld: 98 mg/dL (ref 70–99)
Potassium: 4.2 mEq/L (ref 3.5–5.3)
Sodium: 139 mEq/L (ref 135–145)
Total Bilirubin: 0.5 mg/dL (ref 0.2–1.2)
Total Protein: 6.2 g/dL (ref 6.0–8.3)

## 2014-03-17 LAB — CBC
HCT: 42.6 % (ref 36.0–46.0)
Hemoglobin: 14.3 g/dL (ref 12.0–15.0)
MCH: 31.6 pg (ref 26.0–34.0)
MCHC: 33.6 g/dL (ref 30.0–36.0)
MCV: 94.2 fL (ref 78.0–100.0)
Platelets: 217 10*3/uL (ref 150–400)
RBC: 4.52 MIL/uL (ref 3.87–5.11)
RDW: 13.6 % (ref 11.5–15.5)
WBC: 7.1 10*3/uL (ref 4.0–10.5)

## 2014-03-17 LAB — PROTIME-INR
INR: 2.4 — ABNORMAL HIGH (ref <1.50)
Prothrombin Time: 26.2 seconds — ABNORMAL HIGH (ref 11.6–15.2)

## 2014-03-17 LAB — TSH: TSH: 2.836 u[IU]/mL (ref 0.350–4.500)

## 2014-03-17 MED ORDER — OXYCODONE HCL 5 MG PO TABS: 5.0000 mg | ORAL_TABLET | Freq: Four times a day (QID) | ORAL | Status: DC | PRN

## 2014-03-17 MED ORDER — FLUOCINONIDE-E 0.05 % EX CREA: 1.0000 "application " | TOPICAL_CREAM | Freq: Two times a day (BID) | CUTANEOUS | Status: DC

## 2014-03-17 NOTE — Progress Notes (Signed)
Patient ID: Alicia Vasquez MRN: [id number], DOB: Dec 09, 1940, 73 y.o. Date of Encounter: 03/17/2014, 8:59 AM  Primary Physician: [name], MD  Chief Complaint: HTN, Cancer  HPI: 73 y.o. year old female with a hx of hypertension, DVT, hypothyroidism, HTN and current breast cancer reports to [facility] for a follow up appmt. Pt states she is having associated fatigue, vocal hoarseness, chills, CP, right breast bruising, and diffuse joint pain. Pt states that she feels cold regularly associated with taking coumadin. Pt states that her skin is dark and bruised looking around her right breast associated with her radiation therapy. Pt states she has not begun her chemotherapy due to ongoing right breast pain.   She complains of fatigue and having trouble getting up in the morning.  Her biggest complaint, though, is her right breast pain and she has not started the Arimidex yet out of fear of worsening the pain.  Pt states her coumadin level is still a high, at 4.1. Pt states she is taking 10mg  of coumadin , Monday, Wednesday and Friday.  Pt goes to [facility] coumadin clinic.  Pt is continuing physical therapy.   Pt states she is having slight swelling in her legs associated with her DVT.   Dentist is recommending extraction of remaining upper teeth.  Past Medical History  Diagnosis Date   Allergy    Hypertension    Hypothyroid    Blood transfusion without reported diagnosis    Pneumonia    DVT (deep vein thrombosis) in pregnancy    Breast cancer 07/12/13    invasive mammary carcinoma   Anxiety    Arthritis    Radiation 11/21/13-01/07/14    Right Breast/supraclav.     Home Meds: Prior to Admission medications   Medication Sig Start Date End Date Taking? Authorizing Provider  acetaminophen (TYLENOL) 500 MG tablet Take 500 mg by mouth every 6 (six) hours as needed for mild pain, fever or headache.   Yes Historical Provider, MD  hyaluronate sodium (RADIAPLEXRX) GEL Apply 1  application topically 2 (two) times daily. Apply 2x day after rad tx and bedtime daily and on weekends on rt breast 11/26/13  Yes Thea Silversmith, MD  levothyroxine (SYNTHROID, LEVOTHROID) 125 MCG tablet Take 1 tablet (125 mcg total) by mouth daily. 02/07/14  Yes [name], MD  lisinopril (PRINIVIL,ZESTRIL) 10 MG tablet Take 1 tablet (10 mg total) by mouth daily. 12/26/13  Yes [name], MD  non-metallic deodorant Jethro Poling) MISC Apply 1 application topically daily as needed. 11/27/13  Yes Thea Silversmith, MD  oxyCODONE (OXY IR/ROXICODONE) 5 MG immediate release tablet Take 1 tablet (5 mg total) by mouth every 6 (six) hours as needed. 12/26/13  Yes [name], MD  warfarin (COUMADIN) 5 MG tablet TAKE AS DIRECTED BY ANTICOAGULATION CLINIC   Yes [name], MD  anastrozole (ARIMIDEX) 1 MG tablet Take 1 tablet (1 mg total) by mouth daily. 01/30/14   Chauncey Cruel, MD  PRESCRIPTION MEDICATION Clindamycin 150 mg taking 2 tabs every 6 hrs for 2 days, then 1 tab every 6 hrs until finished    Historical Provider, MD    Allergies:  Allergies  Allergen Reactions   Anesthetics, Amide Other (See Comments)    ELEVATE BLOOD PRESSURE   Benadryl [Diphenhydramine Hcl]     dizziness   Carbocaine [Mepivacaine Hcl] Other (See Comments)    Elevated blood pressure   Codeine Nausea Only and Other (See Comments)    dizziness   Epinephrine Other (See Comments)  Elevated blood pressure   Sulfa Antibiotics Other (See Comments)    dizziness   Vicodin [Hydrocodone-Acetaminophen] Nausea Only   Penicillins Rash    History   Social History   Marital Status: Widowed    Spouse Name: N/A    Number of Children: 1   Years of Education: N/A   Occupational History   Not on file.   Social History Main Topics   Smoking status: Never Smoker    Smokeless tobacco: Not on file   Alcohol Use: No   Drug Use: No   Sexual Activity: No   Other Topics Concern   Not on file   Social  History Narrative   Exercise yard work     Family History  Problem Relation Age of Onset   Cancer Brother     prostate   Heart disease Brother    Colon cancer Brother     Review of Systems: Constitutional: negative for chills, fever, night sweats, weight changes HEENT: negative for vision changes, hearing loss, congestion, rhinorrhea, ST, epistaxis, or sinus pressure Cardiovascular: Dyspnea on exertion, CP, negative for palpitations, or DOE Respiratory: negative for hemoptysis, wheezing, shortness of breath, or cough Abdominal: negative for abdominal pain, nausea, vomiting, diarrhea, or constipation Dermatological: Skin lesions on left elbow Neurologic: negative for headache, dizziness, or syncope Musculoskeletal: Right-sided breast pain All other systems reviewed and are otherwise negative with the exception to those above and in the HPI.   Physical Exam: Blood pressure 154/82, pulse 96, temperature 98.1 F (36.7 C), temperature source Oral, resp. rate 16, height 5' 4.5" (1.638 m), weight 231 lb 12.8 oz (105.144 kg), SpO2 96.00%., Body mass index is 39.19 kg/(m^2). BP Readings from Last 3 Encounters:  03/17/14 154/82  02/13/14 120/85  02/06/14 128/74   Wt Readings from Last 3 Encounters:  03/17/14 231 lb 12.8 oz (105.144 kg)  02/13/14 234 lb 14.4 oz (106.55 kg)  02/06/14 234 lb 6.4 oz (106.323 kg)   General: Well developed, well nourished, in no acute distress. Head: Normocephalic, atraumatic, eyes without discharge, sclera non-icteric, nares are without discharge. Bilateral auditory canals clear, TM's are without perforation, pearly grey and translucent with reflective cone of light bilaterally. Oral cavity moist, posterior pharynx without exudate, erythema, peritonsillar abscess, or post nasal drip. She is missing her posterior lower teeth, and upper crowns show surrounding erythema on the gingiva.  She is slightly swollen left maxillary region.  Neck: Supple. No  thyromegaly. Full ROM. No lymphadenopathy. No carotid bruits. Lungs: Clear bilaterally to auscultation without wheezes, rales, or rhonchi. Breathing is unlabored. Heart: RRR with S1 S2. No murmurs, rubs, or gallops appreciated.  Abdomen: Soft, non-tender, non-distended with normoactive bowel sounds. No hepatosplenomegaly. No rebound/guarding. No obvious abdominal masses. Msk:  Strength and tone normal for age. Extremities/Skin: Warm and dry. No clubbing or cyanosis. Positive for mild (1+)edema. No suspicious lesions. Distal pulses 2+ and equal bilaterally.  Hyperkeratotic left elbow (olecranon) lesion about 1 cm in diameter.  Well healed right breast surgical scar with some mild superior and lateral breast hyperpigmentation. Neuro: Alert and oriented X 3. Moves all extremities spontaneously. Gait is normal. CNII-XII grossly in tact. DTR 2+, cerebellar function intact. Rhomberg normal. Psych:  Responds to questions appropriately with a normal affect.   Labs: No results found for this basename: HGBA1C    CMP pending  ASSESSMENT AND PLAN:  73 y.o. year old female with breast cancer has multiple other complaints. her right breast is obviously her main problem and she is  clearly afraid that something worse is happening. She is having multiple joint pains and myalgia which may in fact be related to stress of her recent diagnosis.  Are having trouble regulating her protime. Her recent elevation suggests that close followup is indicated. We'll  Her blood pressure at home by her report is fine. Reluctant to change her blood pressure medicine at this point until we understand a little better about the nature of her diffuse pain. I'm hoping I can reassure her after getting further blood work and then start the Arimidex. If there is anything new showing up on the blood work, all called Dr. Hazle Nordmann and discuss with how he might proceed.  Her recent hypercalcemia may just be a spurious value or she may be  developing hyperparathyroidism. This would account for her multiple complaints. - Dental abscess - Plan: oxyCODONE (OXY IR/ROXICODONE) 5 MG immediate release tablet, CBC, Parathyroid hormone, intact (no Ca)  Breast cancer, right  Myalgia and myositis - Plan: CBC, Comprehensive metabolic panel, POCT SEDIMENTATION RATE  Hypothyroidism, unspecified hypothyroidism type - Plan: TSH  Chronic fatigue - Plan: CBC, Comprehensive metabolic panel, TSH  Hypercalcemia - Plan: Comprehensive metabolic panel  DVT (deep venous thrombosis), left - Plan: Protime-INR  Skin lesion of left arm - Plan: fluocinonide-emollient (LIDEX-E) 0.05 % cream   Signed, [name], MD 03/17/2014 8:59 AM

## 2014-03-17 NOTE — Patient Instructions (Signed)
I will call you in 24 hours or less about these laboratory results.

## 2014-03-18 ENCOUNTER — Other Ambulatory Visit: Payer: Self-pay | Admitting: Oncology

## 2014-03-18 ENCOUNTER — Other Ambulatory Visit: Payer: Self-pay | Admitting: Family Medicine

## 2014-03-18 DIAGNOSIS — R7989 Other specified abnormal findings of blood chemistry: Secondary | ICD-10-CM

## 2014-03-18 LAB — SEDIMENTATION RATE: Sed Rate: 1 mm/hr (ref 0–22)

## 2014-03-18 LAB — PARATHYROID HORMONE, INTACT (NO CA): PTH: 99 pg/mL — ABNORMAL HIGH (ref 14–64)

## 2014-03-20 ENCOUNTER — Encounter: Payer: Self-pay | Admitting: Radiology

## 2014-03-21 ENCOUNTER — Other Ambulatory Visit: Payer: Self-pay | Admitting: Family Medicine

## 2014-03-24 NOTE — Telephone Encounter (Signed)
Dr L, your OV notes indicate pt is seen at [facility] cou[facility]in [facility], but you have Rxd this for pt in the past. Do you want to RF or does coumadin clinic manage this med?

## 2014-03-25 ENCOUNTER — Ambulatory Visit: Payer: Medicare Other | Admitting: Physical Therapy

## 2014-03-25 DIAGNOSIS — I890 Lymphedema, not elsewhere classified: Secondary | ICD-10-CM | POA: Diagnosis not present

## 2014-03-27 ENCOUNTER — Ambulatory Visit: Payer: Medicare Other | Admitting: Physical Therapy

## 2014-03-27 DIAGNOSIS — I890 Lymphedema, not elsewhere classified: Secondary | ICD-10-CM | POA: Diagnosis not present

## 2014-04-01 ENCOUNTER — Telehealth: Payer: Self-pay | Admitting: Radiology

## 2014-04-01 ENCOUNTER — Ambulatory Visit: Payer: Medicare Other | Attending: Oncology | Admitting: Physical Therapy

## 2014-04-01 DIAGNOSIS — M542 Cervicalgia: Secondary | ICD-10-CM | POA: Diagnosis not present

## 2014-04-01 DIAGNOSIS — M24511 Contracture, right shoulder: Secondary | ICD-10-CM | POA: Insufficient documentation

## 2014-04-01 DIAGNOSIS — I890 Lymphedema, not elsewhere classified: Secondary | ICD-10-CM | POA: Insufficient documentation

## 2014-04-01 DIAGNOSIS — M25511 Pain in right shoulder: Secondary | ICD-10-CM

## 2014-04-01 DIAGNOSIS — M549 Dorsalgia, unspecified: Secondary | ICD-10-CM | POA: Insufficient documentation

## 2014-04-01 DIAGNOSIS — Z9889 Other specified postprocedural states: Secondary | ICD-10-CM | POA: Insufficient documentation

## 2014-04-01 DIAGNOSIS — C50911 Malignant neoplasm of unspecified site of right female breast: Secondary | ICD-10-CM | POA: Insufficient documentation

## 2014-04-01 DIAGNOSIS — R5381 Other malaise: Secondary | ICD-10-CM

## 2014-04-01 NOTE — Therapy (Signed)
Physical Therapy Treatment  Patient Details  Name: Alicia Vasquez MRN: [id number] Date of Birth: 1941/01/23  Encounter Date: 04/01/2014      PT End of Session - 04/01/14 1534    Visit Number 30   Number of Visits 40   Date for PT Re-Evaluation 04/29/14   PT Start Time 2482   PT Stop Time 1520   PT Time Calculation (min) 45 min   Activity Tolerance Patient limited by pain      Past Medical History  Diagnosis Date  . Allergy   . Hypertension   . Hypothyroid   . Blood transfusion without reported diagnosis   . Pneumonia   . DVT (deep vein thrombosis) in pregnancy   . Breast cancer 07/12/13    invasive mammary carcinoma  . Anxiety   . Arthritis   . Radiation 11/21/13-01/07/14    Right Breast/supraclav.    Past Surgical History  Procedure Laterality Date  . Removal of teeth  04/19/2012    13 teeth removed  . Biospy      female organs  . Dilation and curettage of uterus    . Breast lumpectomy with radioactive seed localization Right 09/09/2013    Procedure: BREAST LUMPECTOMY WITH RADIOACTIVE SEED LOCALIZATION WITH AXILLARY NODE EXCISION;  Surgeon: [name], MD;  Location: [place];  Service: General;  Laterality: Right;  . Re-excision of breast lumpectomy Right 09/24/2013    Procedure: RE-EXCISION OF RIGHT BREAST LUMPECTOMY;  Surgeon: [name], MD;  Location: [place];  Service: General;  Laterality: Right;    There were no vitals taken for this visit.  Visit Diagnosis:  No diagnosis found.      Subjective Assessment - 04/01/14 1445    Currently in Pain? Yes   Pain Score 6    Pain Location Back   Pain Orientation Proximal   Pain Descriptors / Indicators Throbbing   Pain Type Chronic pain   Pain Onset More than a month ago   Pain Frequency Constant   Aggravating Factors  worse with walking   Pain Relieving Factors exercsie   Pain Score 5   Pain Location Shoulder   Pain Orientation Right   Pain Radiating  Towards down toward rib cage   Pain Descriptors / Indicators Throbbing   Pain Frequency Constant   Pain Onset With Activity          [facility] PT Assessment - 04/01/14 1400    Precautions   Precaution Comments taking coumadin          Adult PT Treatment/Exercise - 04/01/14 1554    Lumbar Exercises: Standing   Heel Raises 10 reps   Wall Slides 10 reps   Knee/Hip Exercises: Aerobic   Stationary Bike 15 minutes at level 2  nustep legs only   Knee/Hip Exercises: Machines for Strengthening   Cybex Leg Press 2 sets of 10 1 plate   Knee/Hip Exercises: Supine   Bridges 10 reps   Straight Leg Raises 2 sets;5 reps   Straight Leg Raises Limitations c/o pain   Knee/Hip Exercises: Sidelying   Hip ABduction 2 sets;5 reps   Shoulder Exercises: Supine   Other Supine Exercises cane exercise for shoulder flexion   Shoulder Exercises: Seated   Other Seated Exercises diagonal  "chop" and "lift" from outside of knee to shoulder height 5 reps with each arm   Shoulder Exercises: Standing   Other Standing Exercises closed chain with right arm  Plan - 04-Apr-2014 1536    Clinical Impression Statement [name] continues to be limited by pain in back, right shoulder and knees          G-Codes - 04/04/2014 1542    Functional Assessment Tool Used clinical judgement   Other PT Primary Current Status (B3532) At least 40 percent but less than 60 percent impaired, limited or restricted   Other PT Primary Goal Status (D9242) At least 20 percent but less than 40 percent impaired, limited or restricted      Problem List Patient Active Problem List   Diagnosis Date Noted  . Depression 07/18/2013  . Breast cancer of upper-outer quadrant of right female breast 07/15/2013  . Encounter for therapeutic drug monitoring 07/05/2013  . Overactive bladder 01/30/2013  . DVT (deep venous thrombosis), left 12/19/2012  . HTN (hypertension) 09/27/2011  . Hearing loss 09/27/2011  . Chest  wall pain 09/27/2011  . Seasonal allergies 09/27/2011  . SOB (shortness of breath), related to deconditioning 08/26/2011  . Hypothyroid 08/26/2011         Long Term Clinic Goals - 04/04/14 1539    CC Long Term Goal  #1   Title home exercise program    Time 6   Period Weeks   Status On-going   CC Long Term Goal  #2   Title tolerate nu step for 30 minutes to incrase exercise tolerance   Time 6   Period Weeks   Status On-going   CC Long Term Goal  #3   Title Quick Dash assessment will be less than 40    Time 6   Period Weeks   Status On-going   CC Long Term Goal  #4   Title verbalize a plan to continue with community based exercise program   Time 6   Period Weeks   Status On-going        [name]. Owens Shark, PT  2014/04/04, 3:55 PM

## 2014-04-01 NOTE — Therapy (Signed)
Physical Therapy Treatment  Patient Details  Name: PIER LAUX MRN: [id number] Date of Birth: 1941/04/05  Encounter Date: 04/01/2014      PT End of Session - 04/01/14 1534    Visit Number 30   Number of Visits 40   Date for PT Re-Evaluation 04/29/14   PT Start Time 3086   PT Stop Time 1520   PT Time Calculation (min) 45 min   Activity Tolerance Patient limited by pain      Past Medical History  Diagnosis Date  . Allergy   . Hypertension   . Hypothyroid   . Blood transfusion without reported diagnosis   . Pneumonia   . DVT (deep vein thrombosis) in pregnancy   . Breast cancer 07/12/13    invasive mammary carcinoma  . Anxiety   . Arthritis   . Radiation 11/21/13-01/07/14    Right Breast/supraclav.    Past Surgical History  Procedure Laterality Date  . Removal of teeth  04/19/2012    13 teeth removed  . Biospy      female organs  . Dilation and curettage of uterus    . Breast lumpectomy with radioactive seed localization Right 09/09/2013    Procedure: BREAST LUMPECTOMY WITH RADIOACTIVE SEED LOCALIZATION WITH AXILLARY NODE EXCISION;  Surgeon: [name], MD;  Location: [place];  Service: General;  Laterality: Right;  . Re-excision of breast lumpectomy Right 09/24/2013    Procedure: RE-EXCISION OF RIGHT BREAST LUMPECTOMY;  Surgeon: [name], MD;  Location: [place];  Service: General;  Laterality: Right;    There were no vitals taken for this visit.  Visit Diagnosis:  Pain in joint, shoulder region, right  Physical deconditioning      Subjective Assessment - 04/01/14 1445    Currently in Pain? Yes   Pain Score 6    Pain Location Back   Pain Orientation Proximal   Pain Descriptors / Indicators Throbbing   Pain Type Chronic pain   Pain Onset More than a month ago   Pain Frequency Constant   Aggravating Factors  worse with walking   Pain Relieving Factors exercsie   Pain Score 5   Pain Location Shoulder   Pain Orientation Right   Pain Radiating Towards down toward rib cage   Pain Descriptors / Indicators Throbbing   Pain Frequency Constant   Pain Onset With Activity          [facility] PT Assessment - 04/01/14 1400    Precautions   Precaution Comments taking coumadin          Adult PT Treatment/Exercise - 04/01/14 1554    Lumbar Exercises: Standing   Heel Raises 10 reps   Wall Slides 10 reps   Knee/Hip Exercises: Aerobic   Stationary Bike 15 minutes at level 2  nustep legs only   Knee/Hip Exercises: Machines for Strengthening   Cybex Leg Press 2 sets of 10 1 plate   Knee/Hip Exercises: Supine   Bridges 10 reps   Straight Leg Raises 2 sets;5 reps   Straight Leg Raises Limitations c/o pain   Knee/Hip Exercises: Sidelying   Hip ABduction 2 sets;5 reps   Shoulder Exercises: Supine   Other Supine Exercises cane exercise for shoulder flexion   Shoulder Exercises: Seated   Other Seated Exercises diagonal  "chop" and "lift" from outside of knee to shoulder height 5 reps with each arm   Shoulder Exercises: Standing   Other Standing Exercises closed chain with right arm  Plan - Apr 08, 2014 1536    Clinical Impression Statement Ms. Slimp continues to be limited by pain in back, right shoulder and knees          G-Codes - Apr 08, 2014 1542    Functional Assessment Tool Used clinical judgement   Other PT Primary Current Status (G2836) At least 40 percent but less than 60 percent impaired, limited or restricted   Other PT Primary Goal Status (O2947) At least 20 percent but less than 40 percent impaired, limited or restricted      Problem List Patient Active Problem List   Diagnosis Date Noted  . Depression 07/18/2013  . Breast cancer of upper-outer quadrant of right female breast 07/15/2013  . Encounter for therapeutic drug monitoring 07/05/2013  . Overactive bladder 01/30/2013  . DVT (deep venous thrombosis), left 12/19/2012  . HTN (hypertension)  09/27/2011  . Hearing loss 09/27/2011  . Chest wall pain 09/27/2011  . Seasonal allergies 09/27/2011  . SOB (shortness of breath), related to deconditioning 08/26/2011  . Hypothyroid 08/26/2011                                          Long Term Clinic Goals - 04-08-2014 1539    CC Long Term Goal  #1   Title home exercise program    Time 6   Period Weeks   Status On-going   CC Long Term Goal  #2   Title tolerate nu step for 30 minutes to incrase exercise tolerance   Time 6   Period Weeks   Status On-going   CC Long Term Goal  #3   Title Quick Dash assessment will be less than 40    Time 6   Period Weeks   Status On-going   CC Long Term Goal  #4   Title verbalize a plan to continue with community based exercise program   Time 6   Period Weeks   Status On-going         [name]. Owens Shark, PT [name] April 08, 2014, 4:37 PM

## 2014-04-01 NOTE — Telephone Encounter (Signed)
Pt was told last time we spoke to her that we were still awaiting some more of her labs. Can you comment on this? Also, she got a call about an endocrinology referral but she didn't know anything about it.

## 2014-04-02 ENCOUNTER — Ambulatory Visit (INDEPENDENT_AMBULATORY_CARE_PROVIDER_SITE_OTHER): Payer: Medicare Other | Admitting: Family

## 2014-04-02 DIAGNOSIS — Z5181 Encounter for therapeutic drug level monitoring: Secondary | ICD-10-CM

## 2014-04-02 DIAGNOSIS — I82402 Acute embolism and thrombosis of unspecified deep veins of left lower extremity: Secondary | ICD-10-CM | POA: Diagnosis not present

## 2014-04-02 LAB — POCT INR: INR: 3.6

## 2014-04-02 NOTE — Patient Instructions (Signed)
Hold coumadin today. Continue to take 2 tablets all days except take 1 1/2 tablets on M/W/F. Add more greens to diet. Re-check in 3 weeks.  Anticoagulation Dose Instructions as of 04/02/2014      Alicia Vasquez Tue Wed Thu Fri Sat   New Dose 10 mg 7.5 mg 10 mg 7.5 mg 10 mg 7.5 mg 10 mg    Description        Hold coumadin today. Continue to take 2 tablets all days except take 1 1/2 tablets on M/W/F. Add more greens to diet. Re-check in 3 weeks.

## 2014-04-02 NOTE — Telephone Encounter (Signed)
The parathyroid hormone level was elevated which is why I referred her to endocrinology.  Her protime was right where we want it.  Her kidney and liver tests are normal  Her CBC is normal.  There is no evidence for an underlying inflammatory condition or infection.  Some people experience a variety of discomfort when the parathyroid hormone is elevated.

## 2014-04-03 ENCOUNTER — Encounter: Payer: Self-pay | Admitting: Physical Therapy

## 2014-04-03 ENCOUNTER — Ambulatory Visit: Payer: Medicare Other | Admitting: Physical Therapy

## 2014-04-03 DIAGNOSIS — M25511 Pain in right shoulder: Secondary | ICD-10-CM

## 2014-04-03 DIAGNOSIS — R5381 Other malaise: Secondary | ICD-10-CM

## 2014-04-03 DIAGNOSIS — I890 Lymphedema, not elsewhere classified: Secondary | ICD-10-CM | POA: Diagnosis not present

## 2014-04-03 NOTE — Therapy (Signed)
Physical Therapy Treatment  Patient Details  Name: Alicia Vasquez MRN: [id number] Date of Birth: 12/17/1940  Encounter Date: 04/03/2014      PT End of Session - 04/03/14 1548    Visit Number 31   Number of Visits 40   Date for PT Re-Evaluation 04/29/14   PT Start Time 1436   PT Stop Time 1521   PT Time Calculation (min) 45 min   Activity Tolerance Patient tolerated treatment well  needed much encouragement      Past Medical History  Diagnosis Date  . Allergy   . Hypertension   . Hypothyroid   . Blood transfusion without reported diagnosis   . Pneumonia   . DVT (deep vein thrombosis) in pregnancy   . Breast cancer 07/12/13    invasive mammary carcinoma  . Anxiety   . Arthritis   . Radiation 11/21/13-01/07/14    Right Breast/supraclav.    Past Surgical History  Procedure Laterality Date  . Removal of teeth  04/19/2012    13 teeth removed  . Biospy      female organs  . Dilation and curettage of uterus    . Breast lumpectomy with radioactive seed localization Right 09/09/2013    Procedure: BREAST LUMPECTOMY WITH RADIOACTIVE SEED LOCALIZATION WITH AXILLARY NODE EXCISION;  Surgeon: [name], MD;  Location: [facility];  Service: General;  Laterality: Right;  . Re-excision of breast lumpectomy Right 09/24/2013    Procedure: RE-EXCISION OF RIGHT BREAST LUMPECTOMY;  Surgeon: [name], MD;  Location: [facility];  Service: General;  Laterality: Right;    There were no vitals taken for this visit.  Visit Diagnosis:  Pain in joint, shoulder region, right  Physical deconditioning          [facility] Adult PT Treatment/Exercise - 04/03/14 1526    Lumbar Exercises: Machines for Strengthening   Leg Press 10 reps with 1 plate   Lumbar Exercises: Standing   Heel Raises 10 reps   Functional Squats 10 reps   Other Standing Lumbar Exercises glute sets x 10 reps   Other Standing Lumbar Exercises hip abduction x 10 reps   Knee/Hip  Exercises: Aerobic   Stationary Bike 15 minutes nustep with legs only   Knee/Hip Exercises: Machines for Strengthening   Cybex Leg Press 2 sets of 10 1 plate   Shoulder Exercises: Seated   External Rotation 10 reps   Theraband Level (Shoulder External Rotation) Level 2 (Red)   Other Seated Exercises reaching up with alternating arms x 20 reps                Plan - 04/03/14 1549    Clinical Impression Statement [name] was able to participate with therapy today, but continues to have multiple complaints of pain.  She was re-issued home exercise program given previously with written copy and asked to bring completed form back next visit.   Pt will benefit from skilled therapeutic intervention in order to improve on the following deficits Abnormal gait;Decreased strength;Pain   PT Plan Continue with exercise program and encourage patient intiatation to facilitate carry over at home.        Problem List Patient Active Problem List   Diagnosis Date Noted  . Depression 07/18/2013  . Breast cancer of upper-outer quadrant of right female breast 07/15/2013  . Encounter for therapeutic drug monitoring 07/05/2013  . Overactive bladder 01/30/2013  . DVT (deep venous thrombosis), left 12/19/2012  . HTN (hypertension) 09/27/2011  .  Hearing loss 09/27/2011  . Chest wall pain 09/27/2011  . Seasonal allergies 09/27/2011  . SOB (shortness of breath), related to deconditioning 08/26/2011  . Hypothyroid 08/26/2011    Alicia Vasquez. Owens Shark, PT  04/03/2014, 3:56 PM

## 2014-04-07 ENCOUNTER — Ambulatory Visit: Payer: Medicare Other | Admitting: Physical Therapy

## 2014-04-07 DIAGNOSIS — I890 Lymphedema, not elsewhere classified: Secondary | ICD-10-CM | POA: Diagnosis not present

## 2014-04-07 DIAGNOSIS — R5381 Other malaise: Secondary | ICD-10-CM

## 2014-04-07 DIAGNOSIS — M25511 Pain in right shoulder: Secondary | ICD-10-CM

## 2014-04-07 NOTE — Therapy (Signed)
Physical Therapy Treatment  Patient Details  Name: KRISTIANNA SAPERSTEIN MRN: [id number] Date of Birth: August 22, 1940  Encounter Date: 04/07/2014      PT End of Session - 04/07/14 1534    Visit Number 32   Number of Visits 40   Date for PT Re-Evaluation 04/29/14   PT Start Time 1430   PT Stop Time 1518   PT Time Calculation (min) 48 min      Past Medical History  Diagnosis Date  . Allergy   . Hypertension   . Hypothyroid   . Blood transfusion without reported diagnosis   . Pneumonia   . DVT (deep vein thrombosis) in pregnancy   . Breast cancer 07/12/13    invasive mammary carcinoma  . Anxiety   . Arthritis   . Radiation 11/21/13-01/07/14    Right Breast/supraclav.    Past Surgical History  Procedure Laterality Date  . Removal of teeth  04/19/2012    13 teeth removed  . Biospy      female organs  . Dilation and curettage of uterus    . Breast lumpectomy with radioactive seed localization Right 09/09/2013    Procedure: BREAST LUMPECTOMY WITH RADIOACTIVE SEED LOCALIZATION WITH AXILLARY NODE EXCISION;  Surgeon: [name], MD;  Location: [facility];  Service: General;  Laterality: Right;  . Re-excision of breast lumpectomy Right 09/24/2013    Procedure: RE-EXCISION OF RIGHT BREAST LUMPECTOMY;  Surgeon: [name], MD;  Location: [place];  Service: General;  Laterality: Right;    There were no vitals taken for this visit.  Visit Diagnosis:  No diagnosis found.          [facility] Adult PT Treatment/Exercise - 04/07/14 1528    Posture/Postural Control   Posture Comments sitting chest elevation with scapular retraction x 5   Lumbar Exercises: Machines for Strengthening   Leg Press 2 sets of 10 reps with one plate  2 sets   Lumbar Exercises: Standing   Heel Raises 10 reps   Wall Slides 10 reps   Knee/Hip Exercises: Aerobic   Stationary Bike 15 minutes at level 2  nustep legs only   Knee/Hip Exercises: Machines for Strengthening   Cybex Leg Press 2 sets of 10 1 plate   Knee/Hip Exercises: Supine   Short Arc Quad Sets AROM;2 sets;10 reps   Short Arc Quad Sets Limitations leg in external rotation to target VMO   Bridges 10 reps   Straight Leg Raises 2 sets;5 reps   Straight Leg Raises Limitations c/o pain   Knee/Hip Exercises: Sidelying   Hip ABduction 2 sets;5 reps   Shoulder Exercises: Seated   Other Seated Exercises diagonal  "chop" and "lift" from outside of knee to shoulder height 5 reps with each arm   Shoulder Exercises: Standing   Other Standing Exercises closed chain with both  arms with ipsilateral and contralateral step backs                Plan - 04/07/14 1535    Clinical Impression Statement Patient did not bring home program back today and admits to only doing "some" of the exercises.  She continues to have multiple complaints of pain. After exercise today, she said she felt fatgue, but felt had no pain in knees. She continues to use straight cane for balance in walking.   Pt will benefit from skilled therapeutic intervention in order to improve on the following deficits Decreased strength;Difficulty walking;Pain   PT Plan Continue with exercise  program and encourage patient intiatation to facilitate carry over at home.        Problem List Patient Active Problem List   Diagnosis Date Noted  . Depression 07/18/2013  . Breast cancer of upper-outer quadrant of right female breast 07/15/2013  . Encounter for therapeutic drug monitoring 07/05/2013  . Overactive bladder 01/30/2013  . DVT (deep venous thrombosis), left 12/19/2012  . HTN (hypertension) 09/27/2011  . Hearing loss 09/27/2011  . Chest wall pain 09/27/2011  . Seasonal allergies 09/27/2011  . SOB (shortness of breath), related to deconditioning 08/26/2011  . Hypothyroid 08/26/2011  [name]. Owens Shark, PT 04/07/2014, 3:45 PM

## 2014-04-10 ENCOUNTER — Ambulatory Visit: Payer: Medicare Other | Admitting: Physical Therapy

## 2014-04-10 ENCOUNTER — Institutional Professional Consult (permissible substitution): Payer: Medicare Other | Admitting: Cardiology

## 2014-04-10 DIAGNOSIS — I890 Lymphedema, not elsewhere classified: Secondary | ICD-10-CM | POA: Diagnosis not present

## 2014-04-10 DIAGNOSIS — M25511 Pain in right shoulder: Secondary | ICD-10-CM

## 2014-04-10 DIAGNOSIS — R5381 Other malaise: Secondary | ICD-10-CM

## 2014-04-10 NOTE — Therapy (Signed)
Physical Therapy Treatment  Patient Details  Name: Alicia Vasquez MRN: [id number] Date of Birth: Dec 08, 1940  Encounter Date: 04/10/2014      PT End of Session - 04/10/14 1536    PT Start Time 1425   PT Stop Time 1515   PT Time Calculation (min) 50 min   Activity Tolerance Patient tolerated treatment well      Past Medical History  Diagnosis Date  . Allergy   . Hypertension   . Hypothyroid   . Blood transfusion without reported diagnosis   . Pneumonia   . DVT (deep vein thrombosis) in pregnancy   . Breast cancer 07/12/13    invasive mammary carcinoma  . Anxiety   . Arthritis   . Radiation 11/21/13-01/07/14    Right Breast/supraclav.    Past Surgical History  Procedure Laterality Date  . Removal of teeth  04/19/2012    13 teeth removed  . Biospy      female organs  . Dilation and curettage of uterus    . Breast lumpectomy with radioactive seed localization Right 09/09/2013    Procedure: BREAST LUMPECTOMY WITH RADIOACTIVE SEED LOCALIZATION WITH AXILLARY NODE EXCISION;  Surgeon: [name], MD;  Location: [facility];  Service: General;  Laterality: Right;  . Re-excision of breast lumpectomy Right 09/24/2013    Procedure: RE-EXCISION OF RIGHT BREAST LUMPECTOMY;  Surgeon: [name], MD;  Location: [place];  Service: General;  Laterality: Right;    There were no vitals taken for this visit.  Visit Diagnosis:  Pain in joint, shoulder region, right  Physical deconditioning      Subjective Assessment - 04/10/14 1438    Symptoms legs feels better  pt reports she has an appointment with the endocrinologist on Nov. 20 as she has an elevated parathroid reading.  She aslo reports she has been having difficulty reuglating her coumadin levels   Pain Score 6    Pain Location Back   Pain Orientation Posterior;Lower   Pain Descriptors / Indicators Stabbing   Pain Type Chronic pain   Pain Onset More than a month ago   Aggravating  Factors  worse in the morning   Pain Relieving Factors laying down , tylenol   Pain Score 6   Pain Orientation Right   Pain Radiating Towards toward neck   Pain Score 4   Pain Type Chronic pain   Pain Location Knee   Pain Orientation Right;Left   Pain Descriptors / Indicators Throbbing;Aching   Pain Onset On-going   Pain Frequency Constant            [facility] Adult PT Treatment/Exercise - 04/10/14 1445    Lumbar Exercises: Machines for Strengthening   Leg Press 10 reps with 1 plate   Lumbar Exercises: Standing   Heel Raises 10 reps   Functional Squats 10 reps   Functional Squats Limitations pt continues to have difficutly with this exercise  and getting hips back far enough to avoid knee pain   Wall Slides --   Other Standing Lumbar Exercises glute sets x 10 reps pt has difficutly with this   Other Standing Lumbar Exercises hip abduction x 5 reps with each leg   Knee/Hip Exercises: Aerobic   Stationary Bike 86mnutes nustep with legs only   Knee/Hip Exercises: Machines for Strengthening   Cybex Leg Press 2 sets of 10 1 plate   Knee/Hip Exercises: Supine   Short Arc Quad Sets AROM;4 sets;5 reps   Short Arc [employer]  Limitations lefg in external rotation   Hip Adduction Isometric 10 reps   Hip Adduction Isometric Limitations  with yellow ball between legs   Bridges 5 reps   Straight Leg Raises 2 sets;5 reps   Straight Leg Raises Limitations  c/o pain with right leg   Knee/Hip Exercises: Sidelying   Hip ABduction --   Shoulder Exercises: Seated   External Rotation 5 reps   Theraband Level (Shoulder External Rotation) Level 2 (Red)   Other Seated Exercises reaching up with alternating arms x 20 reps   Shoulder Exercises: Standing   Other Standing Exercises modified push ups against countertop x 10 reps                Plan - 04/10/14 1537    Clinical Impression Statement Ms. Hardcastle appears to be walking faster and admits she is walking better, but she still had  multiple complaints of pain with almost every activity.  She admits to not doing her home program.  Several minute discussion about just setting the microwave timer for 5 minutes and do standing and waling in kitchen.  she knows that December 1 will be her last treatment.  She talked about continuing exercise at the [facility] but doesn't know whiich one to go to.   Pt will benefit from skilled therapeutic intervention in order to improve on the following deficits Decreased strength;Difficulty walking;Pain   PT Plan Continue with exercise program and encourage patient intiatation to facilitate carry over at home.        Problem List Patient Active Problem List   Diagnosis Date Noted  . Depression 07/18/2013  . Breast cancer of upper-outer quadrant of right female breast 07/15/2013  . Encounter for therapeutic drug monitoring 07/05/2013  . Overactive bladder 01/30/2013  . DVT (deep venous thrombosis), left 12/19/2012  . HTN (hypertension) 09/27/2011  . Hearing loss 09/27/2011  . Chest wall pain 09/27/2011  . Seasonal allergies 09/27/2011  . SOB (shortness of breath), related to deconditioning 08/26/2011  . Hypothyroid 08/26/2011          [facility] Term Clinic Goals - 04/10/14 1540    CC Long Term Goal  #1   Status On-going   CC Long Term Goal  #2   Title tolerate nu step for 30 minutes to incrase exercise tolerance   Status Partially Met   CC Long Term Goal  #3   Title Quick Dash assessment will be less than 40    Status On-going   CC Long Term Goal  #4   Title verbalize a plan to continue with community based exercise program   Status On-going         [name]. Owens Shark, PT  04/10/2014, 3:44 PM

## 2014-04-14 ENCOUNTER — Other Ambulatory Visit: Payer: Self-pay | Admitting: Family Medicine

## 2014-04-14 ENCOUNTER — Ambulatory Visit (INDEPENDENT_AMBULATORY_CARE_PROVIDER_SITE_OTHER): Payer: Medicare Other

## 2014-04-14 ENCOUNTER — Encounter: Payer: Self-pay | Admitting: Family Medicine

## 2014-04-14 ENCOUNTER — Ambulatory Visit (INDEPENDENT_AMBULATORY_CARE_PROVIDER_SITE_OTHER): Payer: Medicare Other | Admitting: Family Medicine

## 2014-04-14 ENCOUNTER — Other Ambulatory Visit: Payer: Self-pay | Admitting: Radiology

## 2014-04-14 VITALS — BP 130/70 | HR 80 | Temp 97.6°F | Resp 16 | Ht 63.5 in | Wt 231.8 lb

## 2014-04-14 DIAGNOSIS — Z5181 Encounter for therapeutic drug level monitoring: Secondary | ICD-10-CM

## 2014-04-14 DIAGNOSIS — Z7901 Long term (current) use of anticoagulants: Secondary | ICD-10-CM | POA: Diagnosis not present

## 2014-04-14 DIAGNOSIS — R079 Chest pain, unspecified: Secondary | ICD-10-CM | POA: Diagnosis not present

## 2014-04-14 DIAGNOSIS — M25559 Pain in unspecified hip: Secondary | ICD-10-CM

## 2014-04-14 DIAGNOSIS — Z79899 Other long term (current) drug therapy: Secondary | ICD-10-CM | POA: Diagnosis not present

## 2014-04-14 DIAGNOSIS — K047 Periapical abscess without sinus: Secondary | ICD-10-CM

## 2014-04-14 LAB — CBC
HCT: 41.9 % (ref 36.0–46.0)
Hemoglobin: 14.7 g/dL (ref 12.0–15.0)
MCH: 31.8 pg (ref 26.0–34.0)
MCHC: 35.1 g/dL (ref 30.0–36.0)
MCV: 90.7 fL (ref 78.0–100.0)
MPV: 10.6 fL (ref 9.4–12.4)
Platelets: 246 10*3/uL (ref 150–400)
RBC: 4.62 MIL/uL (ref 3.87–5.11)
RDW: 13.8 % (ref 11.5–15.5)
WBC: 6 10*3/uL (ref 4.0–10.5)

## 2014-04-14 LAB — COMPLETE METABOLIC PANEL WITH GFR
ALT: 20 U/L (ref 0–35)
AST: 17 U/L (ref 0–37)
Albumin: 4.1 g/dL (ref 3.5–5.2)
Alkaline Phosphatase: 79 U/L (ref 39–117)
BUN: 13 mg/dL (ref 6–23)
CO2: 25 mEq/L (ref 19–32)
Calcium: 10.4 mg/dL (ref 8.4–10.5)
Chloride: 106 mEq/L (ref 96–112)
Creat: 0.69 mg/dL (ref 0.50–1.10)
GFR, Est African American: >89 mL/min
GFR, Est Non African American: 86 mL/min
Glucose, Bld: 95 mg/dL (ref 70–99)
Potassium: 4.4 mEq/L (ref 3.5–5.3)
Sodium: 139 mEq/L (ref 135–145)
Total Bilirubin: 0.4 mg/dL (ref 0.2–1.2)
Total Protein: 7 g/dL (ref 6.0–8.3)

## 2014-04-14 LAB — POCT SEDIMENTATION RATE: POCT SED RATE: 5 mm/hr (ref 0–22)

## 2014-04-14 MED ORDER — GABAPENTIN 100 MG PO CAPS: 100.0000 mg | ORAL_CAPSULE | Freq: Every day | ORAL | Status: DC

## 2014-04-14 MED ORDER — OXYCODONE HCL 5 MG PO TABS: 5.0000 mg | ORAL_TABLET | Freq: Four times a day (QID) | ORAL | Status: DC | PRN

## 2014-04-14 MED ORDER — WARFARIN SODIUM 5 MG PO TABS: ORAL_TABLET | ORAL | Status: DC

## 2014-04-14 NOTE — Progress Notes (Signed)
Subjective:    Patient ID: Alicia Vasquez, female    DOB: [birth year], 73 y.o.   MRN: [id number] This chart was scribed for Alicia Haber, MD by Marti Sleigh, Medical Scribe. This patient was seen in Room 26 and the patient's care was started a 11:05 AM.   HPI HPI Comments: Alicia Vasquez is a 73 y.o. female with a hx of HTN, DVT, SOB and right breast cancer who presents to [facility] for a follow up appointment. Pt states that [facility] called her about elevated parathyroid hormone recently, and she has booked an appointment with [facility] for later in the week. Pt endorses lower back pain (right worse than left), as well as muscle pain in bilateral thighs and left calf. Pt states she needs a refill of her oxycodone medication. Pt endorses continued vocal hoarseness. Pt is continuing physical therapy, twice weekly.   Previous HPI comments: 73 y.o. year old female with a hx of hypertension, DVT, hypothyroidism, HTN and current breast cancer reports to [facility] for a follow up appmt. Pt states she is having associated fatigue, vocal hoarseness, chills, CP, right breast bruising, and diffuse joint pain. Pt states that she feels cold regularly associated with taking coumadin. Pt states that her skin is dark and bruised looking around her right breast associated with her radiation therapy. Pt states she has not begun her chemotherapy due to ongoing right breast pain.   She complains of fatigue and having trouble getting up in the morning. Her biggest complaint, though, is her right breast pain and she has not started the Arimidex yet out of fear of worsening the pain.  Pt states her coumadin level is still a high, at 4.1. Pt states she is taking 10mg  of coumadin , Monday, Wednesday and Friday. Pt goes to [facility]. Pt is continuing physical therapy.  Pt states she is having slight swelling in her legs associated with her DVT.   Dentist is recommending extraction of remaining upper teeth.  Review  of Systems  Constitutional: Negative for fever and chills.  HENT: Positive for congestion.   Musculoskeletal: Positive for myalgias.       Objective:   Physical Exam  Constitutional: She is oriented to person, place, and time. She appears well-developed and well-nourished.  HENT:  Head: Normocephalic and atraumatic.  Eyes: Pupils are equal, round, and reactive to light.  Neck: Neck supple.  Cardiovascular: Normal rate and regular rhythm.   Pulmonary/Chest: Effort normal and breath sounds normal. No respiratory distress.  Neurological: She is alert and oriented to person, place, and time.  Skin: Skin is warm and dry.  Psychiatric: She has a normal mood and affect. Her behavior is normal.  Nursing note and vitals reviewed.  Patient is tender over the anterior axillary line on the right, upper chest. There is some mild hyperpigmentation associated with the radiation treatment. She's also got some mild swelling in the upper anterior chest just below the clavicle where she is also tender. This area is similarly hyperpigmented.  Patient is tender over the superior aspect of her right pelvis  Her extremities are nontender. She moves slowly but does have a stable gait.  [facility] reading (PRIMARY) by  Dr. Joseph Art: Normal chest and pelvis x-rays.      Assessment & Plan:   Encounter for medication review - Plan: POCT SEDIMENTATION RATE, Protime-INR, CBC, COMPLETE METABOLIC PANEL WITH GFR  Anticoagulated on Coumadin - Plan: Protime-INR, warfarin (COUMADIN) 5 MG tablet  Right-sided chest pain - Plan:  DG Chest 2 View, gabapentin (NEURONTIN) 100 MG capsule  Pain in joint, pelvic region and thigh, unspecified laterality - Plan: DG Pelvis 1-2 Views, gabapentin (NEURONTIN) 100 MG capsule  Patient has had an elevated parathyroid hormone level. She seen the endocrinologist in a couple days. We'll see what the specialist thinks about the relationship of her parathyroid hormone level to her diffuse  myalgias.  In the meantime I've asked her to start taking gabapentin100 mg daily at bedtime and start her Arimidex.  I asked her return in 2 weeks to revisit the issue of chronic pain. I renewed her oxycodone today. Signed, Alicia Haber, MD

## 2014-04-15 ENCOUNTER — Encounter: Payer: Self-pay | Admitting: Radiology

## 2014-04-15 ENCOUNTER — Encounter: Payer: Self-pay | Admitting: Internal Medicine

## 2014-04-15 ENCOUNTER — Ambulatory Visit: Payer: Medicare Other | Admitting: Physical Therapy

## 2014-04-15 DIAGNOSIS — I890 Lymphedema, not elsewhere classified: Secondary | ICD-10-CM | POA: Diagnosis not present

## 2014-04-15 DIAGNOSIS — R5381 Other malaise: Secondary | ICD-10-CM

## 2014-04-15 DIAGNOSIS — M25511 Pain in right shoulder: Secondary | ICD-10-CM

## 2014-04-15 LAB — PROTIME-INR
INR: 2.45 — ABNORMAL HIGH (ref <1.50)
Prothrombin Time: 26.6 seconds — ABNORMAL HIGH (ref 11.6–15.2)

## 2014-04-15 NOTE — Therapy (Signed)
Physical Therapy Treatment  Patient Details  Name: Alicia Vasquez MRN: [id number] Date of Birth: [birth year]  Encounter Date: 04/15/2014      PT End of Session - 04/15/14 1535    Visit Number 34   Number of Visits 40   Date for PT Re-Evaluation 04/29/14   PT Start Time 1518   PT Stop Time 1829   PT Time Calculation (min) 46 min      Past Medical History  Diagnosis Date  . Allergy   . Hypertension   . Hypothyroid   . Blood transfusion without reported diagnosis   . Pneumonia   . DVT (deep vein thrombosis) in pregnancy   . Breast cancer 07/12/13    invasive mammary carcinoma  . Anxiety   . Arthritis   . Radiation 11/21/13-01/07/14    Right Breast/supraclav.    Past Surgical History  Procedure Laterality Date  . Removal of teeth  04/19/2012    13 teeth removed  . Biospy      female organs  . Dilation and curettage of uterus    . Breast lumpectomy with radioactive seed localization Right 09/09/2013    Procedure: BREAST LUMPECTOMY WITH RADIOACTIVE SEED LOCALIZATION WITH AXILLARY NODE EXCISION;  Surgeon: [name], MD;  Location: [place];  Service: General;  Laterality: Right;  . Re-excision of breast lumpectomy Right 09/24/2013    Procedure: RE-EXCISION OF RIGHT BREAST LUMPECTOMY;  Surgeon: [name], MD;  Location: [place];  Service: General;  Laterality: Right;    There were no vitals taken for this visit.  Visit Diagnosis:  Pain in joint, shoulder region, right  Physical deconditioning      Subjective Assessment - 04/15/14 1528    Symptoms coumadin was 2.6,(good), pt to go to the endocrinologist on Friday. Still has elevated parathyroid hormone.. back started hurting saturday. She went to see dr. Joseph Art yesterday and will go back to him in 2 weeks. Patient is concerned about swelling in her right arm and chest   Currently in Pain? Yes   Pain Score 6    Pain Location Back   Pain Orientation Right;Posterior   Pain Descriptors / Indicators Stabbing   Pain Type Chronic pain   Pain Onset More than a month ago   Pain Location Shoulder   Pain Orientation Right   Pain Radiating Towards like a knife under the arm with swelling   Pain Descriptors / Indicators Jabbing;Stabbing   Pain Frequency Constant   Pain Onset With Activity   Pain Score 5   Pain Type Chronic pain   Pain Location Knee   Pain Orientation Right   Pain Descriptors / Indicators Aching;Throbbing   Pain Onset On-going            [facility] Adult PT Treatment/Exercise - 04/15/14 1537    Knee/Hip Exercises: Aerobic   Stationary Bike 24minutes nustep with legs only   Knee/Hip Exercises: Machines for Strengthening   Cybex Leg Press 2 sets of 10 1 plate   Shoulder Exercises: Seated   External Rotation --   Theraband Level (Shoulder External Rotation) --   Other Seated Exercises --   Shoulder Exercises: Standing   Other Standing Exercises modified push ups against countertop x 10 reps   Manual Therapy   Manual Lymphatic Drainage (MLD) in seated position, short neck, diaphragmatic breathing, left axilla, anterior interaxillay anatamosis, right anterior chest and shoulder, right posteriot shoulder and lateral chest.   pt reported decrease in shoulder pain to 4/10  after treatmen                Plan - 04/15/14 1733    Clinical Impression Statement mulitple pain sites continue with patient especially compaining of swelling in right upper and lateral chest and right sholder today.  manual lymph drainage was added to treatment to assess for symptomatic relief.  Pt reported relief and some decongestion of tissues was palpated  Reinforced that patient treament woudl be stopping on december 1   PT Next Visit Plan continue with exercise and manual lymph drainage for symptomatic relief.  Reassess goals next        Problem List Patient Active Problem List   Diagnosis Date Noted  . Depression 07/18/2013  . Breast cancer of  upper-outer quadrant of right female breast 07/15/2013  . Encounter for therapeutic drug monitoring 07/05/2013  . Overactive bladder 01/30/2013  . DVT (deep venous thrombosis), left 12/19/2012  . HTN (hypertension) 09/27/2011  . Hearing loss 09/27/2011  . Chest wall pain 09/27/2011  . Seasonal allergies 09/27/2011  . SOB (shortness of breath), related to deconditioning 08/26/2011  . Hypothyroid 08/26/2011    [name]. Owens Shark, PT  04/15/2014, 5:39 PM

## 2014-04-17 ENCOUNTER — Ambulatory Visit: Payer: Medicare Other | Admitting: Physical Therapy

## 2014-04-17 DIAGNOSIS — M25511 Pain in right shoulder: Secondary | ICD-10-CM

## 2014-04-17 DIAGNOSIS — R5381 Other malaise: Secondary | ICD-10-CM

## 2014-04-17 DIAGNOSIS — I890 Lymphedema, not elsewhere classified: Secondary | ICD-10-CM | POA: Diagnosis not present

## 2014-04-17 NOTE — Therapy (Deleted)
  Patient Details  Name: Alicia Vasquez MRN: [id number] Date of Birth: [birth year]  Encounter Date: 04/17/2014  [name]. Owens Shark, PT [name] 04/17/2014, 5:50 PM

## 2014-04-17 NOTE — Therapy (Signed)
Physical Therapy Treatment  Patient Details  Name: Alicia Vasquez MRN: [id number] Date of Birth: [birth year]  Encounter Date: 04/17/2014      PT End of Session - 04/17/14 1743    Visit Number 35   Number of Visits 40   Date for PT Re-Evaluation 04/29/14   PT Start Time 1520   PT Stop Time 1602   PT Time Calculation (min) 42 min   Activity Tolerance Patient tolerated treatment well   Behavior During Therapy [facility] for tasks assessed/performed      Past Medical History  Diagnosis Date  . Allergy   . Hypertension   . Hypothyroid   . Blood transfusion without reported diagnosis   . Pneumonia   . DVT (deep vein thrombosis) in pregnancy   . Breast cancer 07/12/13    invasive mammary carcinoma  . Anxiety   . Arthritis   . Radiation 11/21/13-01/07/14    Right Breast/supraclav.    Past Surgical History  Procedure Laterality Date  . Removal of teeth  04/19/2012    13 teeth removed  . Biospy      female organs  . Dilation and curettage of uterus    . Breast lumpectomy with radioactive seed localization Right 09/09/2013    Procedure: BREAST LUMPECTOMY WITH RADIOACTIVE SEED LOCALIZATION WITH AXILLARY NODE EXCISION;  Surgeon: [name], MD;  Location: [place];  Service: General;  Laterality: Right;  . Re-excision of breast lumpectomy Right 09/24/2013    Procedure: RE-EXCISION OF RIGHT BREAST LUMPECTOMY;  Surgeon: [name], MD;  Location: [place];  Service: General;  Laterality: Right;    There were no vitals taken for this visit.  Visit Diagnosis:  Pain in joint, shoulder region, right  Physical deconditioning      Subjective Assessment - 04/17/14 1528    Symptoms pt reports the pain was much better after the last treatment   Currently in Pain? Other (Comment)  no pain in shoulder and back today!!   Pain Score 3    Pain Location Knee   Pain Orientation Left;Right  left worse than right   Pain Descriptors / Indicators  Aching   Pain Radiating Towards kneecap   Pain Onset More than a month ago   Pain Frequency Constant   Aggravating Factors  walking   Pain Relieving Factors physcial theapy          [facility] PT Assessment - 04/17/14 1533    Precautions   Precaution Comments taking coumadin          [facility] Adult PT Treatment/Exercise - 04/17/14 1533    Posture/Postural Control   Posture Comments sitting chest elevation with scapular retraction x 5   Neck Exercises: Seated   Cervical Rotation Both;5 reps   Lateral Flexion Both;5 reps   Lumbar Exercises: Machines for Strengthening   Leg Press 2 sets of 10 reps with one plate  2 sets   Lumbar Exercises: Standing   Heel Raises --   Functional Squats --   Functional Squats Limitations --   Wall Slides --   Other Standing Lumbar Exercises glute sets x 10 reps pt has difficutly with this   Other Standing Lumbar Exercises hip abduction x 5 reps with each leg   Knee/Hip Exercises: Aerobic   Stationary Bike 10 minutes at level 2  nustep legs only   Knee/Hip Exercises: Machines for Strengthening   Cybex Leg Press 2 sets of 10 1 plate   Knee/Hip Exercises: Standing  Knee Flexion AROM;Both;10 reps   Other Standing Knee Exercises standing hip extension 10 reps with each leg   Knee/Hip Exercises: Supine   Short Arc Quad Sets --   Hip Adduction Isometric --   Hip Adduction Isometric Limitations --   Bridges --   Straight Leg Raises --   Straight Leg Raises Limitations --   Knee Extension AROM;10 reps  external rotatin   Knee/Hip Exercises: Sidelying   Hip ABduction --   Shoulder Exercises: Seated   External Rotation 5 reps   Theraband Level (Shoulder External Rotation) Level 2 (Red)   Other Seated Exercises diagonal  "chop" and "lift" from outside of knee to shoulder height 5 reps with each arm   Shoulder Exercises: Standing   Other Standing Exercises closed chain with both  arms with ipsilateral and contralateral step backs   Manual Therapy    Manual Lymphatic Drainage (MLD) in seated position, short neck diaphragmatic breathing, right inguinal nodes, right lateral trunk  and axilla                Plan - 04/17/14 1747    PT Next Visit Plan continue with exercise and manual lymph drainage for symptomatic relief.  Repeat quick dash next visit        Problem List Patient Active Problem List   Diagnosis Date Noted  . Depression 07/18/2013  . Breast cancer of upper-outer quadrant of right female breast 07/15/2013  . Encounter for therapeutic drug monitoring 07/05/2013  . Overactive bladder 01/30/2013  . DVT (deep venous thrombosis), left 12/19/2012  . HTN (hypertension) 09/27/2011  . Hearing loss 09/27/2011  . Chest wall pain 09/27/2011  . Seasonal allergies 09/27/2011  . SOB (shortness of breath), related to deconditioning 08/26/2011  . Hypothyroid 08/26/2011           Long Term Clinic Goals - 04/17/14 1746    CC Long Term Goal  #1   Title home exercise program    Time 6   Period Weeks   Status On-going   CC Long Term Goal  #2   Title tolerate nu step for 30 minutes to incrase exercise tolerance   Time 6   Period Weeks   Status On-going   CC Long Term Goal  #3   Title Quick Dash assessment will be less than 40    Time 6   Status On-going   CC Long Term Goal  #4   Title verbalize a plan to continue with community based exercise program   Time 6   Period Weeks   Status On-going         [name]. Brown,PT  Alicia Vasquez 04/17/2014, 5:50 PM

## 2014-04-18 ENCOUNTER — Encounter: Payer: Self-pay | Admitting: Internal Medicine

## 2014-04-18 ENCOUNTER — Ambulatory Visit (INDEPENDENT_AMBULATORY_CARE_PROVIDER_SITE_OTHER): Payer: Medicare Other | Admitting: Internal Medicine

## 2014-04-18 VITALS — BP 122/64 | HR 91 | Temp 97.6°F | Resp 12 | Ht 64.0 in | Wt 232.0 lb

## 2014-04-18 DIAGNOSIS — E213 Hyperparathyroidism, unspecified: Secondary | ICD-10-CM

## 2014-04-18 DIAGNOSIS — E559 Vitamin D deficiency, unspecified: Secondary | ICD-10-CM

## 2014-04-18 LAB — PHOSPHORUS: Phosphorus: 2.8 mg/dL (ref 2.3–4.6)

## 2014-04-18 LAB — VITAMIN D 25 HYDROXY (VIT D DEFICIENCY, FRACTURES): VITD: 7.19 ng/mL — AB (ref 30.00–100.00)

## 2014-04-18 LAB — MAGNESIUM: Magnesium: 2.1 mg/dL (ref 1.5–2.5)

## 2014-04-18 NOTE — Progress Notes (Signed)
Patient ID: Alicia Vasquez, female   DOB: [birth year], 73 y.o.   MRN: [id number]   HPI  Alicia Vasquez is a 73 y.o.-year-old female, referred by her PCP, Dr. 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  Pt was dx with found to have an elevated parathyroid hormone in 02/2014. She had 1x high calcium level before her breast surgery: 11 (I could not find this in the records).  I reviewed pt's pertinent labs: Lab Results  Component Value Date   PTH 99* 03/17/2014   PTH 64.1 10/18/2012   CALCIUM 10.4 04/14/2014   CALCIUM 9.6 03/17/2014   CALCIUM 10.0 01/30/2014   CALCIUM 9.7 12/26/2013   CALCIUM 9.7 10/10/2013   CALCIUM 10.0 09/05/2013   CALCIUM 10.1 07/24/2013   CALCIUM 10.5 02/13/2013   CALCIUM 10.3 01/17/2013   CALCIUM 10.0 10/18/2012   + previous DEXA scans - last 4-5 years ago: no osteoporosis /osteopenia   She had 1 fracture: - 1994: R foot  - 1990's: L hairline fracture   No h/o kidney stones.  No h/o CKD. Last BUN/Cr: Lab Results  Component Value Date   BUN 13 04/14/2014   CREATININE 0.69 04/14/2014   Pt is not on HCTZ.  No h/o vitamin D deficiency. No vit D level available.   Pt is not on calcium but takes 1000 IU vitamin D; she also eats dairy but not green, leafy, vegetables (Coumadin).  No thyroid issues: Lab Results  Component Value Date   TSH 2.836 03/17/2014   Pt does not have a FH of hypercalcemia, pituitary tumors, thyroid cancer, or osteoporosis. Cousin with parathyroid ds..  I reviewed her chart and she also has a history of breast cancer and is on Arimidex. She had 2 sx this Spring. She had RxTx. No ChTx. She also had a L leg in 2013.   ROS: Constitutional: + weight gain, + fatigue, + hot flushes, + nocturia Eyes: + blurry vision, no xerophthalmia ENT: no sore throat, + nodules palpated in throat, no dysphagia/odynophagia, no hoarseness,  + hypoacusis Cardiovascular: + CP/+ SOB/+ palpitations/no leg swelling Respiratory: +  cough/+ SOB Gastrointestinal: + N/no V/+ D/+ C, + heartburn Musculoskeletal: + muscle aches/+ joint aches Skin: no rashes, + easy bruising Neurological: no tremors/numbness/tingling/dizziness, + HA Psychiatric: nodepression/anxiety   Past Medical History  Diagnosis Date  . Allergy   . Hypertension   . Hypothyroid   . Blood transfusion without reported diagnosis   . Pneumonia   . DVT (deep vein thrombosis) in pregnancy   . Breast cancer 07/12/13    invasive mammary carcinoma  . Anxiety   . Arthritis   . Radiation 11/21/13-01/07/14    Right Breast/supraclav.   Past Surgical History  Procedure Laterality Date  . Removal of teeth  04/19/2012    13 teeth removed  . Biospy      female organs  . Dilation and curettage of uterus    . Breast lumpectomy with radioactive seed localization Right 09/09/2013    Procedure: BREAST LUMPECTOMY WITH RADIOACTIVE SEED LOCALIZATION WITH AXILLARY NODE EXCISION;  Surgeon: [name], MD;  Location: [place];  Service: General;  Laterality: Right;  . Re-excision of breast lumpectomy Right 09/24/2013    Procedure: RE-EXCISION OF RIGHT BREAST LUMPECTOMY;  Surgeon: [name], MD;  Location: [place];  Service: General;  Laterality: Right;   History   Social History  . Marital Status: Widowed    Spouse Name: N/A    Number of Children: 1  Occupational History  . Retired Radio broadcast assistant   Social History Main Topics  . Smoking status: Never Smoker   . Smokeless tobacco: Not on file  . Alcohol Use: No  . Drug Use: No   Social History Narrative   Exercise yard work   Current Outpatient Prescriptions on File Prior to Visit  Medication Sig Dispense Refill  . acetaminophen (TYLENOL) 500 MG tablet Take 500 mg by mouth every 6 (six) hours as needed for mild pain, fever or headache.    . anastrozole (ARIMIDEX) 1 MG tablet Take 1 tablet (1 mg total) by mouth daily. 90 tablet 4  . fluocinonide-emollient (LIDEX-E)  0.05 % cream Apply 1 application topically 2 (two) times daily. 30 g 0  . gabapentin (NEURONTIN) 100 MG capsule Take 1 capsule (100 mg total) by mouth at bedtime. 90 capsule 3  . hyaluronate sodium (RADIAPLEXRX) GEL Apply 1 application topically 2 (two) times daily. Apply 2x day after rad tx and bedtime daily and on weekends on rt breast    . levothyroxine (SYNTHROID, LEVOTHROID) 125 MCG tablet Take 1 tablet (125 mcg total) by mouth daily. 90 tablet 3  . lisinopril (PRINIVIL,ZESTRIL) 10 MG tablet Take 1 tablet (10 mg total) by mouth daily. 30 tablet 11  . non-metallic deodorant (ALRA) MISC Apply 1 application topically daily as needed.    Marland Kitchen oxyCODONE (OXY IR/ROXICODONE) 5 MG immediate release tablet Take 1 tablet (5 mg total) by mouth every 6 (six) hours as needed. 30 tablet 0  . PRESCRIPTION MEDICATION Clindamycin 150 mg taking 2 tabs every 6 hrs for 2 days, then 1 tab every 6 hrs until finished    . warfarin (COUMADIN) 5 MG tablet TAKE AS DIRECTED BY ANTICOAGULATION CLINIC 60 tablet 3   No current facility-administered medications on file prior to visit.   Allergies  Allergen Reactions  . Anesthetics, Amide Other (See Comments)    ELEVATE BLOOD PRESSURE  . Benadryl [Diphenhydramine Hcl]     dizziness  . Carbocaine [Mepivacaine Hcl] Other (See Comments)    Elevated blood pressure  . Codeine Nausea Only and Other (See Comments)    dizziness  . Epinephrine Other (See Comments)    Elevated blood pressure  . Sulfa Antibiotics Other (See Comments)    dizziness  . Vicodin [Hydrocodone-Acetaminophen] Nausea Only  . Penicillins Rash   Family History  Problem Relation Age of Onset  . Cancer Brother     prostate  . Heart disease Brother   . Colon cancer Brother    PE: BP 122/64 mmHg  Pulse 91  Temp(Src) 97.6 F (36.4 C) (Oral)  Resp 12  Ht 5\' 4"  (1.626 m)  Wt 232 lb (105.235 kg)  BMI 39.80 kg/m2  SpO2 96% Wt Readings from Last 3 Encounters:  04/18/14 232 lb (105.235 kg)   04/14/14 231 lb 12.8 oz (105.144 kg)  03/17/14 231 lb 12.8 oz (105.144 kg)   Constitutional: overweight, in NAD. No kyphosis. Enlarged [place] fat pads Eyes: PERRLA, EOMI, no exophthalmos ENT: moist mucous membranes, no thyromegaly, no cervical lymphadenopathy Cardiovascular: RRR, No MRG Respiratory: CTA B Gastrointestinal: abdomen soft, NT, ND, BS+ Musculoskeletal: no deformities, strength intact in all 4 Skin: moist, warm, no rashes Neurological: no tremor with outstretched hands, DTR normal in all 4  Assessment: 1. Normocalcemic hyperparathyroidism  Plan: Patient has had normal calcium levels, and apparently a high Calcium level, at 11. An intact PTH level was also high, at 99.  It is unclear whether she has vitamin D deficiency,  however, it is very likely that she does have a parathyroid adenoma based on the high PTH level with a borderline/high calcium.  No apparent complications from hypercalcemia: no h/o nephrolithiasis, no osteoporosis, had 1 traumatic fracture. No abdominal pain, depression, bone pain. - I discussed with the patient about the physiology of calcium and parathyroid hormone, and possible side effects from increased PTH, including kidney stones, osteoporosis, abdominal pain, etc.  - We discussed that we need to check whether his hyperparathyroidism is primary (Familial hypercalcemic hypocalciuria or parathyroid adenoma) or secondary (to conditions like: vitamin D deficiency, calcium malabsorption, hypercalciuria, renal insufficiency, etc.). - we discussed about criteria for parathyroid surgery:  Increased calcium by more than 1 mg/dL above the upper limit of normal  Kidney ds.  Osteoporosis (or Vb fx) Age <74 years old New (2013): High UCa >400 mg/d and increased stone risk by biochemical stone risk analysis Presence of nephrolithiasis or nephrocalcinosis Pt's preference!  - she does not meet the criteria as of now, however I would like to check a DEXA scan to see if  she has osteoporosis. I would add a 33% distal radius for evaluation of cortical bone.  - I will check: calcium level intact PTH ([facility]) Magnesium Phosphorus vitamin D- 25 HO and 1,25 HO  24h urinary calcium/creatinine ratio - if vit D normal - pt given instructions for urine collection and the jug -  If the tests indicate a parathyroid adenoma, I will refer her to surgery. We discussed possible consequences of hyperparathyroidism: ~1/3 pts will develop complications over 15 years (OP, nephrolithiasis). I will wait for the results of the above labs and will discuss with the plan with the patient.  - I will see the patient back in 6 months, I will discuss through my chart or by phone  - time spent with the patient: 1 hour, of which >50% was spent in obtaining information about her symptoms, reviewing her previous labs, evaluations, and treatments, counseling her about her condition (please see the discussed topics above), and developing a plan to further investigate it. She had a number of questions which I addressed.  Component     Latest Ref Rng 04/18/2014 04/18/2014         3:04 PM  3:04 PM  Calcium     8.7 - 10.3 mg/dL  10.0  PTH     15 - 65 pg/mL  44  Vitamin D 1, 25 (OH) Total     18 - 72 pg/mL 55   Vitamin D3 1, 25 (OH)      55   Vitamin D2 1, 25 (OH)      <8   VITD     30.00 - 100.00 ng/mL 7.19 (L)   Magnesium     1.5 - 2.5 mg/dL 2.1   Phosphorus     2.3 - 4.6 mg/dL 2.8    Pt's vitamin D is very low >> need to replete this before we can re-evaluate for HPTH.  Will advise to increase vit D to 5000 IU daily. Will need to repeat a level in 2 months.

## 2014-04-18 NOTE — Patient Instructions (Addendum)
Please stop at the lab.  Please collect a 24h urine - but only after we call you with the blood results:  Patient information (Up-to-Date): Collection of a 24-hour urine specimen  - You should collect every drop of urine during each 24-hour period. It does not matter how much or little urine is passed each time, as long as every drop is collected. - Begin the urine collection in the morning after you wake up, after you have emptied your bladder for the first time. - Urinate (empty the bladder) for the first time and flush it down the toilet. Note the exact time (eg, 6:15 AM). You will begin the urine collection at this time. - Collect every drop of urine during the day and night in an empty collection bottle. Store the bottle at room temperature or in the refrigerator. - If you need to have a bowel movement, any urine passed with the bowel movement should be collected. Try not to include feces with the urine collection. If feces does get mixed in, do not try to remove the feces from the urine collection bottle. - Finish by collecting the first urine passed the next morning, adding it to the collection bottle. This should be within ten minutes before or after the time of the first morning void on the first day (which was flushed). In this example, you would try to void between 6:05 and 6:25 on the second day. - If you need to urinate one hour before the final collection time, drink a full glass of water so that you can void again at the appropriate time. If you have to urinate 20 minutes before, try to hold the urine until the proper time. - Please note the exact time of the final collection, even if it is not the same time as when collection began on day 1. - The bottle(s) may be kept at room temperature for a day or two, but should be kept cool or refrigerated for longer periods of time.  Please come back for a follow-up appointment in 6 months.

## 2014-04-19 LAB — PTH, INTACT AND CALCIUM
CALCIUM: 10 mg/dL (ref 8.7–10.3)
PTH: 44 pg/mL (ref 15–65)

## 2014-04-21 ENCOUNTER — Ambulatory Visit: Payer: Medicare Other | Admitting: Physical Therapy

## 2014-04-21 DIAGNOSIS — M25511 Pain in right shoulder: Secondary | ICD-10-CM

## 2014-04-21 DIAGNOSIS — I890 Lymphedema, not elsewhere classified: Secondary | ICD-10-CM | POA: Diagnosis not present

## 2014-04-21 DIAGNOSIS — R5381 Other malaise: Secondary | ICD-10-CM

## 2014-04-21 NOTE — Therapy (Signed)
Physical Therapy Treatment  Patient Details  Name: Alicia Vasquez MRN: [id number] Date of Birth: [birth year]  Encounter Date: 04/21/2014      PT End of Session - 04/21/14 1700    Visit Number 36   Number of Visits 40   Date for PT Re-Evaluation 04/29/14   PT Start Time 6967   PT Stop Time 8938   PT Time Calculation (min) 48 min      Past Medical History  Diagnosis Date  . Allergy   . Hypertension   . Hypothyroid   . Blood transfusion without reported diagnosis   . Pneumonia   . DVT (deep vein thrombosis) in pregnancy   . Breast cancer 07/12/13    invasive mammary carcinoma  . Anxiety   . Arthritis   . Radiation 11/21/13-01/07/14    Right Breast/supraclav.    Past Surgical History  Procedure Laterality Date  . Removal of teeth  04/19/2012    13 teeth removed  . Biospy      female organs  . Dilation and curettage of uterus    . Breast lumpectomy with radioactive seed localization Right 09/09/2013    Procedure: BREAST LUMPECTOMY WITH RADIOACTIVE SEED LOCALIZATION WITH AXILLARY NODE EXCISION;  Surgeon: [name], MD;  Location: [place];  Service: General;  Laterality: Right;  . Re-excision of breast lumpectomy Right 09/24/2013    Procedure: RE-EXCISION OF RIGHT BREAST LUMPECTOMY;  Surgeon: [name], MD;  Location: [place];  Service: General;  Laterality: Right;    There were no vitals taken for this visit.  Visit Diagnosis:  Pain in joint, shoulder region, right  Physical deconditioning      Subjective Assessment - 04/21/14 1606    Symptoms pt reports she went to the dr about her hyperthyroidism on Friday.  she is awaiting bloodwork results.  she has been feeling OK, just short of breath and tired   Currently in Pain? Yes   Pain Location Knee   Pain Orientation Left;Right   Pain Descriptors / Indicators Throbbing  dull throb   Pain Type Chronic pain   Pain Onset More than a month ago   Pain Frequency  Intermittent   Aggravating Factors  walking   Pain Relieving Factors physical therapy exercise            [facility] Adult PT Treatment/Exercise - 04/21/14 1610    Posture/Postural Control   Posture Comments sitting chest elevation with scapular retraction x 5   Neck Exercises: Seated   Cervical Rotation Both;5 reps   Lateral Flexion Both;5 reps   Lumbar Exercises: Machines for Strengthening   Leg Press 10 reps with 1 plate   Lumbar Exercises: Standing   Heel Raises 10 reps   Other Standing Lumbar Exercises glute sets x 10 reps pt has difficutly with this   Other Standing Lumbar Exercises hip abduction x 7 reps with each leg   Knee/Hip Exercises: Aerobic   Stationary Bike 69minutes nustep with legs only   Knee/Hip Exercises: Machines for Strengthening   Cybex Leg Press 2 sets of 10 1 plate   Knee/Hip Exercises: Standing   Heel Raises 10 reps   Knee Flexion --   Other Standing Knee Exercises staggered stance weight shift onto each leg with dorsi and plantarflexoin  with foot in external rotation   Knee/Hip Exercises: Supine   Short Arc Quad Sets AROM;4 sets;5 reps   Straight Leg Raises 2 sets   Knee Extension AROM;10 reps  external rotatin  Shoulder Exercises: Supine   Other Supine Exercises cane exercise with shoulder flexion   Shoulder Exercises: Seated   External Rotation --   Theraband Level (Shoulder External Rotation) --   Other Seated Exercises reaching up with alternating arms x 14 reps   Shoulder Exercises: Standing   Other Standing Exercises large green swiss ball catch and throw x 5 reps.  pt with c/o pain with this           Plan - 04/21/14 1701    Clinical Impression Statement Overall patient reports she feels better, but she still has c/o pain at multiple sites during the session and is having difficutly progressing exercise intensity. . We talked about transitioning to community based exercise at a senior center today....pt still has not made a decision  about what she wants to do.    PT Next Visit Plan continue with exercise plan, reassess goals next session   PT Plan Continue with exercise program and encourage patient intiatation to facilitate carry over at home.        Problem List Patient Active Problem List   Diagnosis Date Noted  . Depression 07/18/2013  . Breast cancer of upper-outer quadrant of right female breast 07/15/2013  . Encounter for therapeutic drug monitoring 07/05/2013  . Overactive bladder 01/30/2013  . DVT (deep venous thrombosis), left 12/19/2012  . HTN (hypertension) 09/27/2011  . Hearing loss 09/27/2011  . Chest wall pain 09/27/2011  . Seasonal allergies 09/27/2011  . SOB (shortness of breath), related to deconditioning 08/26/2011  . Hypothyroid 08/26/2011   Alicia Vasquez, PT  04/21/2014, 5:08 PM

## 2014-04-22 ENCOUNTER — Ambulatory Visit: Payer: Medicare Other

## 2014-04-22 LAB — VITAMIN D 1,25 DIHYDROXY
Vitamin D 1, 25 (OH)2 Total: 55 pg/mL (ref 18–72)
Vitamin D3 1, 25 (OH)2: 55 pg/mL

## 2014-04-23 ENCOUNTER — Ambulatory Visit: Payer: Medicare Other | Admitting: Physical Therapy

## 2014-04-23 ENCOUNTER — Ambulatory Visit: Payer: Medicare Other

## 2014-04-23 ENCOUNTER — Encounter: Payer: Self-pay | Admitting: *Deleted

## 2014-04-23 DIAGNOSIS — R5381 Other malaise: Secondary | ICD-10-CM

## 2014-04-23 DIAGNOSIS — E213 Hyperparathyroidism, unspecified: Secondary | ICD-10-CM | POA: Insufficient documentation

## 2014-04-23 DIAGNOSIS — I890 Lymphedema, not elsewhere classified: Secondary | ICD-10-CM | POA: Diagnosis not present

## 2014-04-23 DIAGNOSIS — M25511 Pain in right shoulder: Secondary | ICD-10-CM

## 2014-04-23 DIAGNOSIS — E559 Vitamin D deficiency, unspecified: Secondary | ICD-10-CM | POA: Insufficient documentation

## 2014-04-23 NOTE — Therapy (Signed)
Physical Therapy Treatment  Patient Details  Name: GERALYNN CAPRI MRN: [id number] Date of Birth: Nov 13, 1940  Encounter Date: 04/23/2014      PT End of Session - 04/23/14 1737    Visit Number 38   Number of Visits 41   Date for PT Re-Evaluation 04/29/14   PT Start Time 1308   PT Stop Time 7510   PT Time Calculation (min) 39 min   Activity Tolerance Patient tolerated treatment well      Past Medical History  Diagnosis Date  . Allergy   . Hypertension   . Hypothyroid   . Blood transfusion without reported diagnosis   . Pneumonia   . DVT (deep vein thrombosis) in pregnancy   . Breast cancer 07/12/13    invasive mammary carcinoma  . Anxiety   . Arthritis   . Radiation 11/21/13-01/07/14    Right Breast/supraclav.    Past Surgical History  Procedure Laterality Date  . Removal of teeth  04/19/2012    13 teeth removed  . Biospy      female organs  . Dilation and curettage of uterus    . Breast lumpectomy with radioactive seed localization Right 09/09/2013    Procedure: BREAST LUMPECTOMY WITH RADIOACTIVE SEED LOCALIZATION WITH AXILLARY NODE EXCISION;  Surgeon: [name], MD;  Location: [facility];  Service: General;  Laterality: Right;  . Re-excision of breast lumpectomy Right 09/24/2013    Procedure: RE-EXCISION OF RIGHT BREAST LUMPECTOMY;  Surgeon: [name], MD;  Location: [facility];  Service: General;  Laterality: Right;    There were no vitals taken for this visit.  Visit Diagnosis:  Pain in joint, shoulder region, right  Physical deconditioning      Subjective Assessment - 04/23/14 1320    Symptoms pt states she will be increasing her vitamin D to 5000 units per day.   Currently in Pain? Yes   Pain Score 2    Pain Location Back   Pain Orientation Posterior   Pain Descriptors / Indicators Stabbing   Pain Type Chronic pain   Pain Onset More than a month ago   Pain Frequency Intermittent   Aggravating Factors   inactivity or strenous activity   Pain Relieving Factors physical therapy exercise            [facility] Adult PT Treatment/Exercise - 04/23/14 1323    Posture/Postural Control   Posture Comments sitting chest elevation with scapular retraction x 5   Neck Exercises: Seated   Cervical Rotation Both;5 reps   Lateral Flexion Both;5 reps   Lumbar Exercises: Machines for Strengthening   Leg Press  10 reps with 1 plate, 10 reps with 1 plate and 5 extra pounds   Lumbar Exercises: Standing   Heel Raises 10 reps   Other Standing Lumbar Exercises glute sets x 10 reps pt has difficutly with this   Other Standing Lumbar Exercises hip abduction x 7 reps with each leg   Knee/Hip Exercises: Aerobic   Stationary Bike nustep 10 minutes legs only   Knee/Hip Exercises: Machines for Strengthening   Cybex Leg Press --   Knee/Hip Exercises: Standing   Heel Raises 10 reps   Other Standing Knee Exercises staggered stance weight shift onto each leg with dorsi and plantarflexoin  with foot in external rotation   Knee/Hip Exercises: Supine   Short Arc Quad Sets AROM;1 set;10 reps   Straight Leg Raises 2 sets   Knee Extension AROM;10 reps  external rotatin  Shoulder Exercises: Supine   Other Supine Exercises cane exercise with shoulder flexion   Shoulder Exercises: Seated   Other Seated Exercises reaching up with alternating arms x 14 reps   Shoulder Exercises: Standing   Other Standing Exercises --          PT Education - 04/23/14 1736    Education provided Yes   Education Details information about [facility] [employer] at community centers, silver sneakers programs and [employer] for patient to decide how she wants to follow up with community exercise   Person(s) Educated Patient   Methods Explanation;Handout   Comprehension Verbalized understanding          Plan - 04/23/14 1738    Clinical Impression Statement continues with multiple complaints, but appears to be moving better  and is preparing for discharge from physical therapy. she continues to have problems with core and scapular rotation   PT Next Visit Plan continue with exercise plan need to do Quick Dash next session   PT Plan Continue with exercise program and encourage patient intiatation to facilitate carry over at home.        Problem List Patient Active Problem List   Diagnosis Date Noted  . Vitamin D deficiency 04/23/2014  . Hyperparathyroidism 04/23/2014  . Depression 07/18/2013  . Breast cancer of upper-outer quadrant of right female breast 07/15/2013  . Encounter for therapeutic drug monitoring 07/05/2013  . Overactive bladder 01/30/2013  . DVT (deep venous thrombosis), left 12/19/2012  . HTN (hypertension) 09/27/2011  . Hearing loss 09/27/2011  . Chest wall pain 09/27/2011  . Seasonal allergies 09/27/2011  . SOB (shortness of breath), related to deconditioning 08/26/2011  . Hypothyroid 08/26/2011         Long Term Clinic Goals - 04/23/14 1740    CC Long Term Goal  #1   Title home exercise program    Time 6   Period Weeks   Status On-going   CC Long Term Goal  #2   Title tolerate nu step for 30 minutes to incrase exercise tolerance   Time 6   Period Weeks   Status On-going   CC Long Term Goal  #3   Title Quick Dash assessment will be less than 40    Time 6   Period Weeks   Status On-going   CC Long Term Goal  #4   Title verbalize a plan to continue with community based exercise program   Time 6   Period Weeks   Status On-going      [name]. Owens Shark, PT  04/23/2014, 5:42 PM

## 2014-04-29 ENCOUNTER — Ambulatory Visit: Payer: Medicare Other | Attending: Oncology | Admitting: Physical Therapy

## 2014-04-29 ENCOUNTER — Telehealth: Payer: Self-pay | Admitting: Family

## 2014-04-29 ENCOUNTER — Ambulatory Visit (INDEPENDENT_AMBULATORY_CARE_PROVIDER_SITE_OTHER): Payer: Medicare Other

## 2014-04-29 DIAGNOSIS — M24511 Contracture, right shoulder: Secondary | ICD-10-CM | POA: Insufficient documentation

## 2014-04-29 DIAGNOSIS — R5381 Other malaise: Secondary | ICD-10-CM

## 2014-04-29 DIAGNOSIS — Z5181 Encounter for therapeutic drug level monitoring: Secondary | ICD-10-CM

## 2014-04-29 DIAGNOSIS — M549 Dorsalgia, unspecified: Secondary | ICD-10-CM | POA: Insufficient documentation

## 2014-04-29 DIAGNOSIS — Z9889 Other specified postprocedural states: Secondary | ICD-10-CM | POA: Diagnosis not present

## 2014-04-29 DIAGNOSIS — I890 Lymphedema, not elsewhere classified: Secondary | ICD-10-CM | POA: Insufficient documentation

## 2014-04-29 DIAGNOSIS — M542 Cervicalgia: Secondary | ICD-10-CM | POA: Diagnosis not present

## 2014-04-29 DIAGNOSIS — C50911 Malignant neoplasm of unspecified site of right female breast: Secondary | ICD-10-CM | POA: Diagnosis not present

## 2014-04-29 DIAGNOSIS — M25511 Pain in right shoulder: Secondary | ICD-10-CM

## 2014-04-29 DIAGNOSIS — I82402 Acute embolism and thrombosis of unspecified deep veins of left lower extremity: Secondary | ICD-10-CM | POA: Diagnosis not present

## 2014-04-29 LAB — POCT INR: INR: 3.4

## 2014-04-29 NOTE — Telephone Encounter (Signed)
Agree with plan 

## 2014-04-29 NOTE — Therapy (Signed)
[place], [name], [address] Phone: [phone]   Fax:  [phone]  Physical Therapy Treatment  Patient Details  Name: Alicia Vasquez MRN: [id number] Date of Birth: 1940-12-19  Encounter Date: 04/29/2014      PT End of Session - 04/29/14 1802    Visit Number 39   Number of Visits 49   Date for PT Re-Evaluation 05/27/14   PT Start Time 0932   PT Stop Time 1605   PT Time Calculation (min) 49 min   Activity Tolerance Patient tolerated treatment well   Behavior During Therapy [facility] for tasks assessed/performed      Past Medical History  Diagnosis Date  . Allergy   . Hypertension   . Hypothyroid   . Blood transfusion without reported diagnosis   . Pneumonia   . DVT (deep vein thrombosis) in pregnancy   . Breast cancer 07/12/13    invasive mammary carcinoma  . Anxiety   . Arthritis   . Radiation 11/21/13-01/07/14    Right Breast/supraclav.    Past Surgical History  Procedure Laterality Date  . Removal of teeth  04/19/2012    13 teeth removed  . Biospy      female organs  . Dilation and curettage of uterus    . Breast lumpectomy with radioactive seed localization Right 09/09/2013    Procedure: BREAST LUMPECTOMY WITH RADIOACTIVE SEED LOCALIZATION WITH AXILLARY NODE EXCISION;  Surgeon: [name], MD;  Location: [facility];  Service: General;  Laterality: Right;  . Re-excision of breast lumpectomy Right 09/24/2013    Procedure: RE-EXCISION OF RIGHT BREAST LUMPECTOMY;  Surgeon: [name], MD;  Location: [facility];  Service: General;  Laterality: Right;    There were no vitals taken for this visit.  Visit Diagnosis:  Pain in joint, shoulder region, right  Physical deconditioning      Subjective Assessment - 04/29/14 1753    Symptoms I feel better as far as not dragging"... pt states she feels she has more energy since she has started taking Vitamin D. She says  she wants to continue coming to physical therapy. She feels she has made a lot of progress, but is not ready to exercise on her own yet   Patient Stated Goals to continue with physical therapy   Pain Onset More than a month ago   Pain Frequency Intermittent   Aggravating Factors  inactivity or strenous activity   Pain Relieving Factors physical therapy helps   Pain Score 5   Pain Type Chronic pain   Pain Orientation Right   Pain Radiating Towards under arm    Pain Descriptors / Indicators Jabbing;Stabbing   Pain Frequency Intermittent   Pain Onset With Activity            [facility] Adult PT Treatment/Exercise - 04/29/14 1758    Neck Exercises: Seated   Cervical Rotation Both;5 reps   Lateral Flexion Both;5 reps   Shoulder Shrugs 5 reps   Shoulder Rolls Backwards   Other Seated Exercise upper trunk rotation   Lumbar Exercises: Machines for Strengthening   Leg Press  10 reps with 1 plate, 10 reps with 1 plate and 5 extra pounds   Lumbar Exercises: Standing   Heel Raises 10 reps   Knee/Hip Exercises: Aerobic   Stationary Bike nustep 16 minutes legs only   Knee/Hip Exercises: Standing   Other Standing Knee Exercises --  with foot in external rotation   Knee/Hip Exercises: Seated  Long Arc [employer] Strengthening;Both;2 sets;10 reps;Weights  3 #   Knee/Hip Exercises: Supine   Bridges 5 reps   Straight Leg Raises 2 sets   Knee Extension AROM;10 reps  external rotatin   Knee/Hip Exercises: Sidelying   Hip ABduction 10 reps   Shoulder Exercises: Supine   Other Supine Exercises cane exercise with shoulder flexion                Plan - 04/29/14 1803    Clinical Impression Statement Ms. Karlen continues to have complaints of pain, but states that she has seen improvement with physical therapy.  She has been compliant with attendance, but admits she has not been doing home exercises as she should. She has started to see improvement with her energy levels since medication  changes.  I feel she would benefit from another  4 weeks of therapy to  progress improvements toward functional independence gains at home.  She agrees that she will be more compliant with home exercises and will make a decsion about community exercise program.    Pt will benefit from skilled therapeutic intervention in order to improve on the following deficits Decreased strength;Difficulty walking;Pain;Decreased activity tolerance;Decreased endurance   Rehab Potential Good   Clinical Impairments Affecting Rehab Potential medical issues with parathyroid and coumadin regulation, arthritis in knees and back, effects of cancer surgery and radiation.   PT Frequency 2x / week   PT Duration 4 weeks   PT Next Visit Plan continue with exercise and progress intensity.  Ask for compliance with home exercise.  GCode next                       [name] - 04/29/14 0001    Open a tight or new jar Unable   Do heavy household chores (wash walls, wash floors) Severe difficulty   Carry a shopping bag or briefcase Mild difficulty   Wash your back Moderate difficulty   Use a knife to cut food Mild difficulty   Recreational activities in which you take some force or impact through your arm, shoulder, or hand (golf, hammering, tennis) Mild difficulty   During the past week, to what extent has your arm, shoulder or hand problem interfered with your normal social activities with family, friends, neighbors, or groups? Modererately   During the past week, to what extent has your arm, shoulder or hand problem limited your work or other regular daily activities Slightly   Arm, shoulder, or hand pain. Mild   Tingling (pins and needles) in your arm, shoulder, or hand Moderate   Difficulty Sleeping Mild difficulty   DASH Score 43.18 %              Short Term Clinic Goals - 04/29/14 1533    CC Short Term Goal  #1   Title z         Long Term Clinic Goals - 04/29/14 1534    CC Long Term Goal   #1   Title home exercise program    Time 4   Status On-going   CC Long Term Goal  #2   Title tolerate nu step for 30 minutes to incrase exercise tolerance   Time 4   Status On-going   CC Long Term Goal  #3   Title Quick Dash assessment will be less than 40    Time 4   Period Weeks   Status On-going  DASH score 43.18   CC Long Term Goal  #  4   Title verbalize a plan to continue with community based exercise program   Time 4   Period Weeks   Status On-going  pt has materials but hasn't decided where she wants to go         Problem List Patient Active Problem List   Diagnosis Date Noted  . Vitamin D deficiency 04/23/2014  . Hyperparathyroidism 04/23/2014  . Depression 07/18/2013  . Breast cancer of upper-outer quadrant of right female breast 07/15/2013  . Encounter for therapeutic drug monitoring 07/05/2013  . Overactive bladder 01/30/2013  . DVT (deep venous thrombosis), left 12/19/2012  . HTN (hypertension) 09/27/2011  . Hearing loss 09/27/2011  . Chest wall pain 09/27/2011  . Seasonal allergies 09/27/2011  . SOB (shortness of breath), related to deconditioning 08/26/2011  . Hypothyroid 08/26/2011     [name]. Owens Shark, PT  04/29/2014, 6:13 PM

## 2014-05-01 ENCOUNTER — Ambulatory Visit: Payer: Medicare Other | Admitting: Physical Therapy

## 2014-05-01 ENCOUNTER — Encounter: Payer: Self-pay | Admitting: Family Medicine

## 2014-05-01 ENCOUNTER — Ambulatory Visit (INDEPENDENT_AMBULATORY_CARE_PROVIDER_SITE_OTHER): Payer: Medicare Other | Admitting: Family Medicine

## 2014-05-01 VITALS — BP 120/70 | HR 76 | Temp 97.9°F | Resp 16 | Ht 63.5 in | Wt 228.6 lb

## 2014-05-01 DIAGNOSIS — M25511 Pain in right shoulder: Secondary | ICD-10-CM

## 2014-05-01 DIAGNOSIS — I890 Lymphedema, not elsewhere classified: Secondary | ICD-10-CM | POA: Diagnosis not present

## 2014-05-01 DIAGNOSIS — R109 Unspecified abdominal pain: Secondary | ICD-10-CM | POA: Diagnosis not present

## 2014-05-01 DIAGNOSIS — R5381 Other malaise: Secondary | ICD-10-CM

## 2014-05-01 DIAGNOSIS — R490 Dysphonia: Secondary | ICD-10-CM | POA: Diagnosis not present

## 2014-05-01 DIAGNOSIS — R0789 Other chest pain: Secondary | ICD-10-CM

## 2014-05-01 DIAGNOSIS — K047 Periapical abscess without sinus: Secondary | ICD-10-CM

## 2014-05-01 MED ORDER — OXYCODONE HCL 5 MG PO TABS: 5.0000 mg | ORAL_TABLET | Freq: Four times a day (QID) | ORAL | Status: DC | PRN

## 2014-05-01 NOTE — Therapy (Signed)
[place], [name], [address] Phone: [phone]   Fax:  [phone]  Physical Therapy Treatment  Patient Details  Name: Alicia Vasquez MRN: [id number] Date of Birth: 31-Aug-1940  Encounter Date: 05/01/2014      PT End of Session - 05/01/14 1746    Visit Number 40   Number of Visits 49   Date for PT Re-Evaluation 05/27/14   PT Start Time 1520   PT Stop Time 1600   PT Time Calculation (min) 40 min   Activity Tolerance Patient tolerated treatment well   Behavior During Therapy [facility] for tasks assessed/performed      Past Medical History  Diagnosis Date  . Allergy   . Hypertension   . Hypothyroid   . Blood transfusion without reported diagnosis   . Pneumonia   . DVT (deep vein thrombosis) in pregnancy   . Breast cancer 07/12/13    invasive mammary carcinoma  . Anxiety   . Arthritis   . Radiation 11/21/13-01/07/14    Right Breast/supraclav.    Past Surgical History  Procedure Laterality Date  . Removal of teeth  04/19/2012    13 teeth removed  . Biospy      female organs  . Dilation and curettage of uterus    . Breast lumpectomy with radioactive seed localization Right 09/09/2013    Procedure: BREAST LUMPECTOMY WITH RADIOACTIVE SEED LOCALIZATION WITH AXILLARY NODE EXCISION;  Surgeon: [name], MD;  Location: [facility];  Service: General;  Laterality: Right;  . Re-excision of breast lumpectomy Right 09/24/2013    Procedure: RE-EXCISION OF RIGHT BREAST LUMPECTOMY;  Surgeon: [name], MD;  Location: [facility];  Service: General;  Laterality: Right;    There were no vitals taken for this visit.  Visit Diagnosis:  No diagnosis found.      Subjective Assessment - 05/01/14 1531    Symptoms Pt states she went to see Dr. Joseph Art today.  She is tired today, but overall is doing better   Patient Stated Goals to continue with physical therapy   Currently in  Pain? Yes   Pain Score 3    Pain Location Back   Pain Orientation Right   Pain Descriptors / Indicators Sharp   Pain Type Chronic pain   Pain Onset More than a month ago   Pain Score 5   Pain Location Scapula   Pain Orientation Right   Pain Radiating Towards anterior axilla   Pain Descriptors / Indicators Other (Comment)  feels like a torn muscle            [facility] Adult PT Treatment/Exercise - 05/01/14 1535    Neck Exercises: Seated   Cervical Rotation Both;5 reps   Lateral Flexion Both;5 reps   Shoulder Shrugs 5 reps   Shoulder Rolls Backwards   Other Seated Exercise upper trunk rotation   Lumbar Exercises: Machines for Strengthening   Leg Press  2 sets of 10 with 1 plate and 5 extra pounds   Lumbar Exercises: Standing   Heel Raises 10 reps   Knee/Hip Exercises: Aerobic   Stationary Bike nustep 12 minutes legs only   Knee/Hip Exercises: Standing   Other Standing Knee Exercises --  with foot in external rotation   Knee/Hip Exercises: Seated   Long Arc Quad Strengthening;Both;2 sets;10 reps;Weights  3 #   Knee/Hip Exercises: Supine   Bridges 5 reps   Straight Leg Raises 2 sets   Knee Extension AROM;10 reps  external rotatin   Knee/Hip Exercises: Sidelying   Hip ABduction 10 reps   Shoulder Exercises: Supine   Other Supine Exercises cane exercise with shoulder flexion   Shoulder Exercises: Seated   External Rotation AAROM;5 reps   Internal Rotation AAROM   Other Seated Exercises diagonal chop and lift with right arm   Manual Therapy   Manual Lymphatic Drainage (MLD) in seated postion, short neck, right shoulder, upper arm, chest and lateral chest under arm                Plan - 05-04-14 1747    Clinical Impression Statement pt with fatigue today, but able to participate with PT.  She states she is feeling better   Rehab Potential Good   Clinical Impairments Affecting Rehab Potential medical issues with parathyroid and coumadin regulation, arthritis in  knees and back, effects of cancer surgery and radiation.   PT Frequency 2x / week   PT Duration 4 weeks   PT Next Visit Plan continue with exercise and progress intensity.  Ask for compliance with home exercise.           G-Codes - 2014-05-04 1749    Functional Assessment Tool Used clinical judgement   Other PT Primary Current Status [phone]) At least 40 percent but less than 60 percent impaired, limited or restricted   Other PT Primary Goal Status (N4076) At least 20 percent but less than 40 percent impaired, limited or restricted       Problem List Patient Active Problem List   Diagnosis Date Noted  . Vitamin D deficiency 04/23/2014  . Hyperparathyroidism 04/23/2014  . Depression 07/18/2013  . Breast cancer of upper-outer quadrant of right female breast 07/15/2013  . Encounter for therapeutic drug monitoring 07/05/2013  . Overactive bladder 01/30/2013  . DVT (deep venous thrombosis), left 12/19/2012  . HTN (hypertension) 09/27/2011  . Hearing loss 09/27/2011  . Chest wall pain 09/27/2011  . Seasonal allergies 09/27/2011  . SOB (shortness of breath), related to deconditioning 08/26/2011  . Hypothyroid 08/26/2011   [name]. Owens Shark, PT  04-May-2014, 5:50 PM

## 2014-05-01 NOTE — Progress Notes (Addendum)
Subjective:  This chart was scribed for [name], MD by Donato Schultz, Medical Scribe. This patient was seen in Room 25 and the patient's care was started at 10:19 AM.   Patient ID: Alicia Vasquez, female    DOB: [birth year], 73 y.o.   MRN: [id number]  HPI HPI Comments: Alicia Vasquez is a 73 y.o. female who presents to the [facility] for a follow-up visit.  She was given 5000 units of Vitamin D daily for her hyperparathyroidism.  Her voice is still hoarse and her hearing on the left side has decreased.  She is still complaining of fatigue and soreness and numbness in her right shoulder.  Her right hand will become numb after she types on the computer for long periods of time.  She has a follow-up appointment with Dr. Donne Hazel this month.  She takes pain medication at night and experiences relief to her pain after doing physical therapy.  She has never had her vocal cords examined by ENT.    She is complaining of constant left-sided abdominal soreness.  She has never had a colonoscopy.   Past Medical History  Diagnosis Date  . Allergy   . Hypertension   . Hypothyroid   . Blood transfusion without reported diagnosis   . Pneumonia   . DVT (deep vein thrombosis) in pregnancy   . Breast cancer 07/12/13    invasive mammary carcinoma  . Anxiety   . Arthritis   . Radiation 11/21/13-01/07/14    Right Breast/supraclav.   Past Surgical History  Procedure Laterality Date  . Removal of teeth  04/19/2012    13 teeth removed  . Biospy      female organs  . Dilation and curettage of uterus    . Breast lumpectomy with radioactive seed localization Right 09/09/2013    Procedure: BREAST LUMPECTOMY WITH RADIOACTIVE SEED LOCALIZATION WITH AXILLARY NODE EXCISION;  Surgeon: [name], MD;  Location: [facility];  Service: General;  Laterality: Right;  . Re-excision of breast lumpectomy Right 09/24/2013    Procedure: RE-EXCISION OF RIGHT BREAST LUMPECTOMY;   Surgeon: [name], MD;  Location: [facility];  Service: General;  Laterality: Right;   Family History  Problem Relation Age of Onset  . Cancer Brother     prostate  . Heart disease Brother   . Colon cancer Brother    History   Social History  . Marital Status: Widowed    Spouse Name: N/A    Number of Children: 1  . Years of Education: N/A   Occupational History  . Not on file.   Social History Main Topics  . Smoking status: Never Smoker   . Smokeless tobacco: Not on file  . Alcohol Use: No  . Drug Use: No  . Sexual Activity: No   Other Topics Concern  . Not on file   Social History Narrative   Exercise yard work   Allergies  Allergen Reactions  . Anesthetics, [place] Other (See Comments)    ELEVATE BLOOD PRESSURE  . Benadryl [Diphenhydramine Hcl]     dizziness  . Carbocaine [Mepivacaine Hcl] Other (See Comments)    Elevated blood pressure  . Codeine Nausea Only and Other (See Comments)    dizziness  . Epinephrine Other (See Comments)    Elevated blood pressure  . Sulfa Antibiotics Other (See Comments)    dizziness  . Vicodin [Hydrocodone-Acetaminophen] Nausea Only  . Penicillins Rash    Review of  Systems  Constitutional: Positive for fatigue.  HENT: Positive for hearing loss and voice change.   Gastrointestinal: Positive for abdominal pain.  Musculoskeletal: Positive for arthralgias.  Neurological: Positive for numbness.    Objective:  Physical Exam  Constitutional: She is oriented to person, place, and time. She appears well-developed and well-nourished.  Able to sit up without pushing off the table.    HENT:  Head: Normocephalic and atraumatic.  Eyes: EOM are normal.  Neck: Normal range of motion.  Cardiovascular: Normal rate, regular rhythm and normal heart sounds.   No murmur heard. Pulmonary/Chest: Effort normal and breath sounds normal. No respiratory distress. She has no wheezes. She has no rales.  Abdominal: Soft.  There is no tenderness.  Musculoskeletal: Normal range of motion.  Neurological: She is alert and oriented to person, place, and time.  Skin: Skin is warm and dry.  Psychiatric: She has a normal mood and affect. Her behavior is normal.  Nursing note and vitals reviewed.   BP 120/70 mmHg  Pulse 76  Temp(Src) 97.9 F (36.6 C) (Oral)  Resp 16  Ht 5' 3.5" (1.613 m)  Wt 228 lb 9.6 oz (103.692 kg)  BMI 39.85 kg/m2  SpO2 96% Assessment & Plan:   Dental abscess  Chest wall pain - Plan: oxyCODONE (OXY IR/ROXICODONE) 5 MG immediate release tablet  Hoarseness - Plan: Ambulatory referral to ENT  Overall patient is gaining strength.  The hoarseness continues to bother her as does the post surgical right chest numbness.  Exam is unchanged except for her weight loss and increased muscle strength.  She was encouraged to start her Arimidex. Follow up in 6 weeks. Signed, [name], MD   This chart was scribed in my presence and reviewed by me personally.    ICD-9-CM ICD-10-CM   1. Dental abscess 522.5 K04.7   2. Chest wall pain 786.52 R07.89 DISCONTINUED: oxyCODONE (OXY IR/ROXICODONE) 5 MG immediate release tablet  3. Hoarseness 784.42 R49.0 Ambulatory referral to ENT     Signed, [name], MD

## 2014-05-07 ENCOUNTER — Ambulatory Visit: Payer: Medicare Other | Admitting: Physical Therapy

## 2014-05-07 DIAGNOSIS — R5381 Other malaise: Secondary | ICD-10-CM

## 2014-05-07 DIAGNOSIS — I890 Lymphedema, not elsewhere classified: Secondary | ICD-10-CM | POA: Diagnosis not present

## 2014-05-07 DIAGNOSIS — M25511 Pain in right shoulder: Secondary | ICD-10-CM

## 2014-05-07 NOTE — Therapy (Signed)
[place], [name], [address] Phone: [phone]   Fax:  [phone]  Physical Therapy Treatment  Patient Details  Name: Alicia Vasquez MRN: [id number] Date of Birth: 1940-11-21  Encounter Date: 05/07/2014      PT End of Session - 05/07/14 1853    Visit Number 41   Number of Visits 49   Date for PT Re-Evaluation 05/27/14   PT Start Time 1520   PT Stop Time 1600   PT Time Calculation (min) 40 min      Past Medical History  Diagnosis Date  . Allergy   . Hypertension   . Hypothyroid   . Blood transfusion without reported diagnosis   . Pneumonia   . DVT (deep vein thrombosis) in pregnancy   . Breast cancer 07/12/13    invasive mammary carcinoma  . Anxiety   . Arthritis   . Radiation 11/21/13-01/07/14    Right Breast/supraclav.    Past Surgical History  Procedure Laterality Date  . Removal of teeth  04/19/2012    13 teeth removed  . Biospy      female organs  . Dilation and curettage of uterus    . Breast lumpectomy with radioactive seed localization Right 09/09/2013    Procedure: BREAST LUMPECTOMY WITH RADIOACTIVE SEED LOCALIZATION WITH AXILLARY NODE EXCISION;  Surgeon: [name], MD;  Location: [facility];  Service: General;  Laterality: Right;  . Re-excision of breast lumpectomy Right 09/24/2013    Procedure: RE-EXCISION OF RIGHT BREAST LUMPECTOMY;  Surgeon: [name], MD;  Location: [facility];  Service: General;  Laterality: Right;    There were no vitals taken for this visit.  Visit Diagnosis:  Pain in joint, shoulder region, right  Physical deconditioning      Subjective Assessment - 05/07/14 1529    Symptoms Feeling better... that vitamin D is  helping    Currently in Pain? Yes   Pain Score 3    Pain Location Back   Pain Orientation Posterior   Pain Type Chronic pain   Pain Relieving Factors tylenol this am and that helps   Pain Score 4   Pain Location Knee   Pain Orientation Right;Left   Pain Frequency Intermittent   Pain Onset With Activity            [facility] Adult PT Treatment/Exercise - 05/07/14 1534    Neck Exercises: Seated   Cervical Rotation Both;5 reps   Lateral Flexion Both;5 reps   Shoulder Shrugs 5 reps   Shoulder Rolls Backwards   Lumbar Exercises: Machines for Strengthening   Leg Press  10 reps with 1 plate, 10 reps with 1 plate and 5 extra pounds   Lumbar Exercises: Standing   Heel Raises 10 reps   Other Standing Lumbar Exercises glute sets x 10 reps pt has difficutly with this   Other Standing Lumbar Exercises hip abduction x 7 reps with each leg   Knee/Hip Exercises: Aerobic   Stationary Bike nustep 10 minutes legs only at level 3   Knee/Hip Exercises: Machines for Strengthening   Cybex Leg Press   2 sets of 10 with  plate and extra 5 pounds   Knee/Hip Exercises: Standing   Heel Raises 10 reps   Other Standing Knee Exercises staggered stance weight shift onto each leg with dorsi and plantarflexoin  with foot in external rotation   Knee/Hip Exercises: Supine   Knee Extension AROM;10 reps  external rotation and 3 #  Shoulder Exercises: Supine   Other Supine Exercises --   Shoulder Exercises: Seated   Other Seated Exercises backward shoulder rolls x 10 reps   Shoulder Exercises: Standing   Other Standing Exercises scaption x 10 reps with back against the wall   Other Standing Exercises bicep curls with 1 # weight                 Plan - 05/07/14 1853    Clinical Impression Statement pt appears to be better today, with more energy. Responding to treatment         Problem List Patient Active Problem List   Diagnosis Date Noted  . Vitamin D deficiency 04/23/2014  . Hyperparathyroidism 04/23/2014  . Depression 07/18/2013  . Breast cancer of upper-outer quadrant of right female breast 07/15/2013  . Encounter for therapeutic drug monitoring 07/05/2013  . Overactive bladder  01/30/2013  . DVT (deep venous thrombosis), left 12/19/2012  . HTN (hypertension) 09/27/2011  . Hearing loss 09/27/2011  . Chest wall pain 09/27/2011  . Seasonal allergies 09/27/2011  . SOB (shortness of breath), related to deconditioning 08/26/2011  . Hypothyroid 08/26/2011   [name]. Owens Shark, PT  [name] 05/07/2014, 6:56 PM

## 2014-05-08 ENCOUNTER — Ambulatory Visit: Payer: Medicare Other | Admitting: Physical Therapy

## 2014-05-08 DIAGNOSIS — R5381 Other malaise: Secondary | ICD-10-CM

## 2014-05-08 DIAGNOSIS — I890 Lymphedema, not elsewhere classified: Secondary | ICD-10-CM | POA: Diagnosis not present

## 2014-05-08 DIAGNOSIS — M25511 Pain in right shoulder: Secondary | ICD-10-CM

## 2014-05-08 NOTE — Therapy (Signed)
[place], [name], [address] Phone: [phone]   Fax:  [phone]  Physical Therapy Treatment  Patient Details  Name: Alicia Vasquez MRN: [id number] Date of Birth: 04/15/1941  Encounter Date: 05/08/2014      PT End of Session - 05/08/14 1746    Visit Number 42   Number of Visits 7   Date for PT Re-Evaluation 05/27/14   PT Start Time 1435   PT Stop Time 1515   PT Time Calculation (min) 40 min      Past Medical History  Diagnosis Date  . Allergy   . Hypertension   . Hypothyroid   . Blood transfusion without reported diagnosis   . Pneumonia   . DVT (deep vein thrombosis) in pregnancy   . Breast cancer 07/12/13    invasive mammary carcinoma  . Anxiety   . Arthritis   . Radiation 11/21/13-01/07/14    Right Breast/supraclav.    Past Surgical History  Procedure Laterality Date  . Removal of teeth  04/19/2012    13 teeth removed  . Biospy      female organs  . Dilation and curettage of uterus    . Breast lumpectomy with radioactive seed localization Right 09/09/2013    Procedure: BREAST LUMPECTOMY WITH RADIOACTIVE SEED LOCALIZATION WITH AXILLARY NODE EXCISION;  Surgeon: [name], MD;  Location: [facility];  Service: General;  Laterality: Right;  . Re-excision of breast lumpectomy Right 09/24/2013    Procedure: RE-EXCISION OF RIGHT BREAST LUMPECTOMY;  Surgeon: [name], MD;  Location: [facility];  Service: General;  Laterality: Right;    There were no vitals taken for this visit.  Visit Diagnosis:  Pain in joint, shoulder region, right  Physical deconditioning      Subjective Assessment - 05/08/14 1454    Symptoms feels like she had  more pain in back, knees and under right arm today.   Currently in Pain? Yes   Pain Score 3    Pain Orientation Posterior   Pain Type Chronic pain   Pain Score 4   Pain Type Chronic pain   Pain Location Knee   Pain  Orientation Right;Left            [facility] Adult PT Treatment/Exercise - 05/08/14 1457    High Level Balance   High Level Balance Activities Side stepping;Backward walking   Neck Exercises: Seated   Cervical Rotation Both;5 reps   Lateral Flexion Both;5 reps   Shoulder Shrugs 5 reps   Shoulder Rolls Backwards   Other Seated Exercise upper trunk rotation   Other Seated Exercise upper thoracic stretch   Lumbar Exercises: Machines for Strengthening   Leg Press  10 reps with 1 plate, 2 sets  pt thinks the increase in weight yesterday caused pain   Lumbar Exercises: Standing   Heel Raises --   Other Standing Lumbar Exercises --   Other Standing Lumbar Exercises --   Knee/Hip Exercises: Aerobic   Stationary Bike nustep 15 minutes legs only at level 3   Knee/Hip Exercises: Machines for Strengthening   Cybex Leg Press --   Knee/Hip Exercises: Standing   Heel Raises --   Other Standing Knee Exercises staggered stance weight shift onto each leg with dorsi and plantarflexoin  with foot in external rotation   Knee/Hip Exercises: Supine   Short Arc Quad Sets AROM;10 reps;Both   Heel Slides Both;5 reps   Bridges 5 reps   Knee Extension AROM;10  reps  external rotation and 3 #   Shoulder Exercises: Seated   Other Seated Exercises backward shoulder rolls x 10 reps   Shoulder Exercises: Standing   Other Standing Exercises scaption x 10 reps with back against the wall   Other Standing Exercises bicep curls with 1 # weight                 Plan - 05/08/14 1746    Clinical Impression Statement continues steady functional improvement, but continues to have c/o pain at multiple sites.    PT Next Visit Plan continue with exercise, add more gait variations and balance challenges encourage home exercise             [facility] Goals - 05/08/14 1748    CC Long Term Goal  #1   Title home exercise program    Time 4   Status On-going   CC Long Term Goal  #2   Title tolerate  nu step for 30 minutes to incrase exercise tolerance   Time 4   Status On-going   CC Long Term Goal  #3   Title Quick Dash assessment will be less than 40    Time 4   Period Weeks   Status On-going   CC Long Term Goal  #4   Title verbalize a plan to continue with community based exercise program   Time 4   Period Weeks         Problem List Patient Active Problem List   Diagnosis Date Noted  . Vitamin D deficiency 04/23/2014  . Hyperparathyroidism 04/23/2014  . Depression 07/18/2013  . Breast cancer of upper-outer quadrant of right female breast 07/15/2013  . Encounter for therapeutic drug monitoring 07/05/2013  . Overactive bladder 01/30/2013  . DVT (deep venous thrombosis), left 12/19/2012  . HTN (hypertension) 09/27/2011  . Hearing loss 09/27/2011  . Chest wall pain 09/27/2011  . Seasonal allergies 09/27/2011  . SOB (shortness of breath), related to deconditioning 08/26/2011  . Hypothyroid 08/26/2011   [name]. Owens Shark, PT   05/08/2014, 5:49 PM

## 2014-05-12 ENCOUNTER — Telehealth: Payer: Self-pay

## 2014-05-12 NOTE — Telephone Encounter (Signed)
LMVM for patient to come in to have her flu shot.

## 2014-05-13 ENCOUNTER — Ambulatory Visit (INDEPENDENT_AMBULATORY_CARE_PROVIDER_SITE_OTHER): Payer: Medicare Other

## 2014-05-13 ENCOUNTER — Ambulatory Visit: Payer: Medicare Other | Admitting: Physical Therapy

## 2014-05-13 DIAGNOSIS — M25511 Pain in right shoulder: Secondary | ICD-10-CM

## 2014-05-13 DIAGNOSIS — I890 Lymphedema, not elsewhere classified: Secondary | ICD-10-CM | POA: Diagnosis not present

## 2014-05-13 DIAGNOSIS — Z5181 Encounter for therapeutic drug level monitoring: Secondary | ICD-10-CM

## 2014-05-13 DIAGNOSIS — R5381 Other malaise: Secondary | ICD-10-CM

## 2014-05-13 DIAGNOSIS — I82402 Acute embolism and thrombosis of unspecified deep veins of left lower extremity: Secondary | ICD-10-CM

## 2014-05-13 LAB — POCT INR: INR: 2.6

## 2014-05-13 NOTE — Therapy (Signed)
[place], [name], [address] Phone: [phone]   Fax:  [phone]  Physical Therapy Treatment  Patient Details  Name: Alicia Vasquez MRN: [id number] Date of Birth: 11-23-1940  Encounter Date: 05/13/2014      PT End of Session - 05/13/14 1515    Visit Number 43   Number of Visits 49   Date for PT Re-Evaluation 05/27/14   PT Start Time 1432   PT Stop Time 1514   PT Time Calculation (min) 42 min      Past Medical History  Diagnosis Date  . Allergy   . Hypertension   . Hypothyroid   . Blood transfusion without reported diagnosis   . Pneumonia   . DVT (deep vein thrombosis) in pregnancy   . Breast cancer 07/12/13    invasive mammary carcinoma  . Anxiety   . Arthritis   . Radiation 11/21/13-01/07/14    Right Breast/supraclav.    Past Surgical History  Procedure Laterality Date  . Removal of teeth  04/19/2012    13 teeth removed  . Biospy      female organs  . Dilation and curettage of uterus    . Breast lumpectomy with radioactive seed localization Right 09/09/2013    Procedure: BREAST LUMPECTOMY WITH RADIOACTIVE SEED LOCALIZATION WITH AXILLARY NODE EXCISION;  Surgeon: [name], MD;  Location: [facility];  Service: General;  Laterality: Right;  . Re-excision of breast lumpectomy Right 09/24/2013    Procedure: RE-EXCISION OF RIGHT BREAST LUMPECTOMY;  Surgeon: [name], MD;  Location: [facility];  Service: General;  Laterality: Right;    There were no vitals taken for this visit.  Visit Diagnosis:  Pain in joint, shoulder region, right  Physical deconditioning      Subjective Assessment - 05/13/14 1433    Symptoms c/o pain in right groin area and both knees.  She thinks she might have done too much walking over the weekends.    Currently in Pain? Yes   Pain Score 4    Pain Location Back   Pain Orientation Right   Pain Descriptors / Indicators  Aching   Pain Type Chronic pain   Pain Relieving Factors physical therapy helps   Pain Score 4   Pain Type Chronic pain   Pain Location Knee   Pain Orientation Left;Right            [facility] Adult PT Treatment/Exercise - 05/13/14 1436    High Level Balance   High Level Balance Activities Side stepping;Backward walking   Neck Exercises: Seated   Cervical Rotation Both;Other (comment)  2 reps   Lateral Flexion Both  2 reps   Shoulder Shrugs 5 reps  continues to have trouble with shrugging   Other Seated Exercise upper trunk rotation   Other Seated Exercise upper thoracic stretch   Lumbar Exercises: Machines for Strengthening   Leg Press  10 reps with 1 plate, 2 sets   Knee/Hip Exercises: Aerobic   Stationary Bike nustep 10 minutes legs only at level 2   Knee/Hip Exercises: Standing   Other Standing Knee Exercises staggered stance weight shift onto each leg with dorsi and plantarflexoin  with foot in external rotation   Knee/Hip Exercises: Supine   Bridges 5 reps   Knee Extension AROM;10 reps  external rotation and 3 #   Shoulder Exercises: Supine   Other Supine Exercises cane exrercises 10    Shoulder Exercises: Seated   Other Seated Exercises  chop and lift 10 reps to each direction for abdominal activation   Shoulder Exercises: Standing   Other Standing Exercises scaption x 10 reps with back against the wall   Other Standing Exercises bicep curls with 1 # weight    Shoulder Exercises: ROM/Strengthening   "W" Arms 5 reps   Other ROM/Strengthening Exercises scaption with both arms.   Other ROM/Strengthening Exercises bicep curls 15 reps but both arms.   Ankle Exercises: Seated   Heel Raises 10 reps      Moist pack applied to low back in sitting position for 10 minutes while pt did some of her sitting exercises for  Pain relief.  Pt states this felt great.          Plan - 05/13/14 1516    Clinical Impression Statement Patient received moist pack to back during  treatment today and report that it seemed to help decrease the pain in her right hip.  She said she felt better after treatment. Patient continues to have multiple complaints of pain, but appears to be improving overall.    PT Next Visit Plan continue with exercise, add more gait variations and balance challenges encourage home exercise           [facility] Goals - 05/13/14 1518    CC Long Term Goal  #1   Title home exercise program    Time 4   Period Weeks   Status On-going   CC Long Term Goal  #2   Title tolerate nu step for 30 minutes to incrase exercise tolerance   Time 4   Period Weeks   Status On-going   CC Long Term Goal  #3   Title Quick Dash assessment will be less than 40    Time 4   Period Weeks   Status On-going   CC Long Term Goal  #4   Title verbalize a plan to continue with community based exercise program   Time 4   Period Weeks   Status On-going         Problem List Patient Active Problem List   Diagnosis Date Noted  . Vitamin D deficiency 04/23/2014  . Hyperparathyroidism 04/23/2014  . Depression 07/18/2013  . Breast cancer of upper-outer quadrant of right female breast 07/15/2013  . Encounter for therapeutic drug monitoring 07/05/2013  . Overactive bladder 01/30/2013  . DVT (deep venous thrombosis), left 12/19/2012  . HTN (hypertension) 09/27/2011  . Hearing loss 09/27/2011  . Chest wall pain 09/27/2011  . Seasonal allergies 09/27/2011  . SOB (shortness of breath), related to deconditioning 08/26/2011  . Hypothyroid 08/26/2011   [name]. Owens Shark, PT   05/13/2014, 3:21 PM

## 2014-05-15 ENCOUNTER — Ambulatory Visit: Payer: Medicare Other | Admitting: Physical Therapy

## 2014-05-15 DIAGNOSIS — I890 Lymphedema, not elsewhere classified: Secondary | ICD-10-CM | POA: Diagnosis not present

## 2014-05-15 DIAGNOSIS — M25511 Pain in right shoulder: Secondary | ICD-10-CM

## 2014-05-15 DIAGNOSIS — R5381 Other malaise: Secondary | ICD-10-CM

## 2014-05-15 NOTE — Therapy (Signed)
[place], [name], [address] Phone: [phone]   Fax:  [phone]  Physical Therapy Treatment  Patient Details  Name: Alicia Vasquez MRN: [id number] Date of Birth: 1941-05-13  Encounter Date: 05/15/2014      PT End of Session - 05/15/14 1821    Visit Number 32   Number of Visits 49   Date for PT Re-Evaluation 05/27/14   PT Start Time 1521   PT Stop Time 1600   PT Time Calculation (min) 39 min   Activity Tolerance Patient tolerated treatment well      Past Medical History  Diagnosis Date  . Allergy   . Hypertension   . Hypothyroid   . Blood transfusion without reported diagnosis   . Pneumonia   . DVT (deep vein thrombosis) in pregnancy   . Breast cancer 07/12/13    invasive mammary carcinoma  . Anxiety   . Arthritis   . Radiation 11/21/13-01/07/14    Right Breast/supraclav.    Past Surgical History  Procedure Laterality Date  . Removal of teeth  04/19/2012    13 teeth removed  . Biospy      female organs  . Dilation and curettage of uterus    . Breast lumpectomy with radioactive seed localization Right 09/09/2013    Procedure: BREAST LUMPECTOMY WITH RADIOACTIVE SEED LOCALIZATION WITH AXILLARY NODE EXCISION;  Surgeon: [name], MD;  Location: [facility];  Service: General;  Laterality: Right;  . Re-excision of breast lumpectomy Right 09/24/2013    Procedure: RE-EXCISION OF RIGHT BREAST LUMPECTOMY;  Surgeon: [name], MD;  Location: [facility];  Service: General;  Laterality: Right;    There were no vitals taken for this visit.  Visit Diagnosis:  Pain in joint, shoulder region, right  Physical deconditioning      Subjective Assessment - 05/15/14 1530    Symptoms "i'm not hurting as bad'  pt late for appointment today   Currently in Pain? Yes   Pain Score 3    Pain Location Back                    [facility] Adult PT  Treatment/Exercise - 05/15/14 1532    High Level Balance   High Level Balance Activities Side stepping;Backward walking   Neck Exercises: Seated   Cervical Rotation Both;Other (comment)  2 reps   Lateral Flexion Both  2 reps   Shoulder Shrugs 5 reps  continues to have trouble with shrugging   Other Seated Exercise upper trunk rotation   Other Seated Exercise upper thoracic stretch   Lumbar Exercises: Machines for Strengthening   Leg Press  10 reps with 1 plate, 3 sets   Knee/Hip Exercises: Aerobic   Stationary Bike nustep 10 minutes legs only at level 2   Knee/Hip Exercises: Standing   Other Standing Knee Exercises staggered stance weight shift onto each leg with dorsi and plantarflexoin  with foot in external rotation   Knee/Hip Exercises: Seated   Long Arc Quad 2 sets;5 reps   Other Seated Knee Exercises heel raises   Knee/Hip Exercises: Supine   Terminal Knee Extension 2 sets;10 reps;Strengthening  2# weight , with leg in external rotation   Bridges 5 reps   Shoulder Exercises: Supine   Other Supine Exercises cane exrercises 10 with no weight , 10 with 2#   Shoulder Exercises: Seated   Other Seated Exercises chop and lift 10 reps to each direction for  abdominal activation   Shoulder Exercises: Standing   Other Standing Exercises scaption x 10 reps with back against the wall   Other Standing Exercises bicep curls with 1 # weight    Shoulder Exercises: ROM/Strengthening   "W" Arms 5 reps   Other ROM/Strengthening Exercises scaption with both arms.   Other ROM/Strengthening Exercises bicep curls 15 reps but both arms.   Ankle Exercises: Seated   Heel Raises 10 reps                            Plan - 05/15/14 1823    PT Next Visit Plan Reasses wit Quick DASH  continue exercise        Problem List Patient Active Problem List   Diagnosis Date Noted  . Vitamin D deficiency 04/23/2014  . Hyperparathyroidism 04/23/2014  . Depression 07/18/2013  .  Breast cancer of upper-outer quadrant of right female breast 07/15/2013  . Encounter for therapeutic drug monitoring 07/05/2013  . Overactive bladder 01/30/2013  . DVT (deep venous thrombosis), left 12/19/2012  . HTN (hypertension) 09/27/2011  . Hearing loss 09/27/2011  . Chest wall pain 09/27/2011  . Seasonal allergies 09/27/2011  . SOB (shortness of breath), related to deconditioning 08/26/2011  . Hypothyroid 08/26/2011   [name]. Owens Shark, PT  05/15/2014, 6:24 PM  [place], [name], [address] Phone: [phone]   Fax:  [phone]

## 2014-05-26 ENCOUNTER — Ambulatory Visit: Payer: Medicare Other | Admitting: Physical Therapy

## 2014-05-26 DIAGNOSIS — I890 Lymphedema, not elsewhere classified: Secondary | ICD-10-CM | POA: Diagnosis not present

## 2014-05-26 DIAGNOSIS — R5381 Other malaise: Secondary | ICD-10-CM

## 2014-05-26 DIAGNOSIS — M25511 Pain in right shoulder: Secondary | ICD-10-CM

## 2014-05-26 NOTE — Therapy (Signed)
[place], [name], [address] Phone: [phone]   Fax:  [phone]  Physical Therapy Treatment  Patient Details  Name: Alicia Vasquez MRN: [id number] Date of Birth: 02-Nov-1940  Encounter Date: 05/26/2014      PT End of Session - 05/26/14 1719    Visit Number 45   Number of Visits 49   Date for PT Re-Evaluation 05/27/14   PT Start Time 1025   PT Stop Time 1601   PT Time Calculation (min) 46 min      Past Medical History  Diagnosis Date  . Allergy   . Hypertension   . Hypothyroid   . Blood transfusion without reported diagnosis   . Pneumonia   . DVT (deep vein thrombosis) in pregnancy   . Breast cancer 07/12/13    invasive mammary carcinoma  . Anxiety   . Arthritis   . Radiation 11/21/13-01/07/14    Right Breast/supraclav.    Past Surgical History  Procedure Laterality Date  . Removal of teeth  04/19/2012    13 teeth removed  . Biospy      female organs  . Dilation and curettage of uterus    . Breast lumpectomy with radioactive seed localization Right 09/09/2013    Procedure: BREAST LUMPECTOMY WITH RADIOACTIVE SEED LOCALIZATION WITH AXILLARY NODE EXCISION;  Surgeon: [name], MD;  Location: [facility];  Service: General;  Laterality: Right;  . Re-excision of breast lumpectomy Right 09/24/2013    Procedure: RE-EXCISION OF RIGHT BREAST LUMPECTOMY;  Surgeon: [name], MD;  Location: [facility];  Service: General;  Laterality: Right;    There were no vitals taken for this visit.  Visit Diagnosis:  Pain in joint, shoulder region, right  Physical deconditioning      Subjective Assessment - 05/26/14 1522    Symptoms still having pain in her knees and back. pt thinks she may have done too much walking.   Currently in Pain? Yes   Pain Score 4    Pain Orientation Right   Pain Descriptors / Indicators Aching;Sharp   Pain Type Acute pain   Pain Relieving Factors tylenol helps    Pain Score 5   Pain Type Chronic pain   Pain Orientation Right;Left                    [facility] Adult PT Treatment/Exercise - 05/26/14 1526    Neck Exercises: Seated   Cervical Rotation Both;Other (comment)  2 reps   Lateral Flexion Both  2 reps   Shoulder Shrugs 5 reps  continues to have trouble with shrugging   Shoulder Rolls Backwards;5 reps   Other Seated Exercise neural stretch  with head rotation to opposite side   Lumbar Exercises: Machines for Strengthening   Leg Press  10 reps with 1 plate, 3 sets   Lumbar Exercises: Standing   Heel Raises 10 reps   Functional Squats 10 reps   Other Standing Lumbar Exercises staggered stance weight shift 5 reps on each side   Knee/Hip Exercises: Aerobic   Stationary Bike nustep 7 minutes at level 3. pt making an effort to go a little faster.   Knee/Hip Exercises: Machines for Strengthening   Cybex Leg Press --   Knee/Hip Exercises: Standing   Other Standing Knee Exercises --   Knee/Hip Exercises: Seated   Long Arc Quad 2 sets;5 reps   Other Seated Knee Exercises heel raises   Knee/Hip Exercises: Supine  Terminal Knee Extension 2 sets;10 reps;Strengthening  3 #   Bridges --   Knee/Hip Exercises: Sidelying   Hip ABduction 5 reps  pt reports difficulty with this today   Shoulder Exercises: Supine   Other Supine Exercises cane exrercises 10 with no weight , 10 with 2#   Shoulder Exercises: Seated   Other Seated Exercises chop and lift 10 reps to each direction for abdominal activation   Shoulder Exercises: Standing   Other Standing Exercises scaption x 10 reps with back against the wall   Other Standing Exercises bicep curls with 1 # weight    Shoulder Exercises: ROM/Strengthening   "W" Arms 5 reps   Other ROM/Strengthening Exercises --   Other ROM/Strengthening Exercises --                   [facility] Goals - 04/29/14 1533    CC Short Term Goal  #1   Title  z             [facility] Goals - 05/13/14 1518    [facility] Long Term Goal  #1   Title home exercise program    Time 4   Period Weeks   Status On-going   CC Long Term Goal  #2   Title tolerate nu step for 30 minutes to incrase exercise tolerance   Time 4   Period Weeks   Status On-going   CC Long Term Goal  #3   Title Quick Dash assessment will be less than 40    Time 4   Period Weeks   Status On-going   CC Long Term Goal  #4   Title verbalize a plan to continue with community based exercise program   Time 4   Period Weeks   Status On-going            Plan - 05/26/14 1719    Clinical Impression Statement multiple areas of pain continue.  Pt with some supraclavicular edema on both sides, right > left noticed today .  Continuing to work on active range of motion and strengthening to arms, legs and trunk. Progress is slow, but pt appears to be moving easier.    PT Next Visit Plan Reasses wit Quick DASH  continue exercise  Needs re-evaluation and reassessment of goals         Problem List Patient Active Problem List   Diagnosis Date Noted  . Vitamin D deficiency 04/23/2014  . Hyperparathyroidism 04/23/2014  . Depression 07/18/2013  . Breast cancer of upper-outer quadrant of right female breast 07/15/2013  . Encounter for therapeutic drug monitoring 07/05/2013  . Overactive bladder 01/30/2013  . DVT (deep venous thrombosis), left 12/19/2012  . HTN (hypertension) 09/27/2011  . Hearing loss 09/27/2011  . Chest wall pain 09/27/2011  . Seasonal allergies 09/27/2011  . SOB (shortness of breath), related to deconditioning 08/26/2011  . Hypothyroid 08/26/2011   [name]. Owens Shark PT   [name] 05/26/2014, [place], [name], [address] Phone: [phone]   Fax:  [phone]

## 2014-05-28 ENCOUNTER — Ambulatory Visit: Payer: Medicare Other | Admitting: Physical Therapy

## 2014-05-28 DIAGNOSIS — M25511 Pain in right shoulder: Secondary | ICD-10-CM

## 2014-05-28 DIAGNOSIS — I890 Lymphedema, not elsewhere classified: Secondary | ICD-10-CM | POA: Diagnosis not present

## 2014-05-28 DIAGNOSIS — R5381 Other malaise: Secondary | ICD-10-CM

## 2014-05-28 NOTE — Therapy (Signed)
Alicia Vasquez, [name], [address] Phone: [phone]   Fax:  [phone]  Physical Therapy Treatment  Patient Details  Name: Alicia Vasquez MRN: [id number] Date of Birth: 1940-07-09  Encounter Date: 05/28/2014      PT End of Session - 05/28/14 1725    Visit Number 49   Number of Visits 104   Date for PT Re-Evaluation 06/25/14   PT Start Time 6073   PT Stop Time 1433   PT Time Calculation (min) 48 min      Past Medical History  Diagnosis Date  . Allergy   . Hypertension   . Hypothyroid   . Blood transfusion without reported diagnosis   . Pneumonia   . DVT (deep vein thrombosis) in pregnancy   . Breast cancer 07/12/13    invasive mammary carcinoma  . Anxiety   . Arthritis   . Radiation 11/21/13-01/07/14    Right Breast/supraclav.    Past Surgical History  Procedure Laterality Date  . Removal of teeth  04/19/2012    13 teeth removed  . Biospy      female organs  . Dilation and curettage of uterus    . Breast lumpectomy with radioactive seed localization Right 09/09/2013    Procedure: BREAST LUMPECTOMY WITH RADIOACTIVE SEED LOCALIZATION WITH AXILLARY NODE EXCISION;  Surgeon: [name], MD;  Location: [facility];  Service: General;  Laterality: Right;  . Re-excision of breast lumpectomy Right 09/24/2013    Procedure: RE-EXCISION OF RIGHT BREAST LUMPECTOMY;  Surgeon: [name], MD;  Location[facility];  Service: General;  Laterality: Right;    There were no vitals taken for this visit.  Visit Diagnosis:  Pain in joint, shoulder region, right  Physical deconditioning      Subjective Assessment - 05/28/14 1410    Symptoms " I took tylenol about noon"  She still has swelling in anterior chest and supraclavicular area on the right side with pain at right breast .  "I feel like I'm walking a longer distance and not as short of beath as I was a month  ago.                [name] - 05/28/14 1407    Open a tight or new jar Mild difficulty   Do heavy household chores (wash walls, wash floors) Severe difficulty   Carry a shopping bag or briefcase Mild difficulty   Wash your back Severe difficulty   Use a knife to cut food Mild difficulty   Recreational activities in which you take some force or impact through your arm, shoulder, or hand (golf, hammering, tennis) Moderate difficulty   During the past week, to what extent has your arm, shoulder or hand problem interfered with your normal social activities with family, friends, neighbors, or groups? Slightly   During the past week, to what extent has your arm, shoulder or hand problem limited your work or other regular daily activities Modererately   Arm, shoulder, or hand pain. Moderate   Tingling (pins and needles) in your arm, shoulder, or hand Moderate   Difficulty Sleeping Moderate difficulty   DASH Score 45.45 %            [facility] Adult PT Treatment/Exercise - 05/28/14 1426    High Level Balance   High Level Balance Activities Other (comment)  reaching down into drawer, up into cabinet for 5 min   Neck Exercises: Seated   Cervical  Rotation Both;Other (comment)  2 reps   Lateral Flexion Both  2 reps   Shoulder Shrugs 5 reps  continues to have trouble with shrugging   Shoulder Rolls Backwards;5 reps   Other Seated Exercise neural stretch  with head rotation to opposite side   Lumbar Exercises: Machines for Strengthening   Leg Press  10 reps with 1 plate, 10 reps with 1.5 plates   Lumbar Exercises: Standing   Heel Raises 10 reps   Functional Squats 10 reps   Knee/Hip Exercises: Aerobic   Stationary Bike nustep 15  minutes at level 3. pt making an effort to go a little faster.   Knee/Hip Exercises: Standing   Other Standing Knee Exercises glute sets   Knee/Hip Exercises: Seated   Long Arc Quad --   Other Seated Knee Exercises heel raises   Knee/Hip Exercises:  Supine   Heel Slides Both;10 reps   Terminal Knee Extension --   Knee/Hip Exercises: Sidelying   Hip ABduction --   Shoulder Exercises: Supine   Other Supine Exercises cane exrercises 10 with no weight , 10 with 2#   Shoulder Exercises: Seated   Other Seated Exercises chop and lift 10 reps to each direction for abdominal activation   Shoulder Exercises: Standing   Other Standing Exercises scaption x 10 reps with back against the wall   Other Standing Exercises bicep curls with 1 # weight    Shoulder Exercises: ROM/Strengthening   "W" Arms 5 reps                   Short Term Clinic Goals - 04/29/14 1533    CC Short Term Goal  #1   Title z             Long Term Clinic Goals - 05/28/14 1734    CC Long Term Goal  #1   Title independence in updated home exercise program   Time 4   Period Weeks   Status New   CC Long Term Goal  #2   Title tolerate nu step for 30 minutes to incrase exercise tolerance   CC Long Term Goal  #3   Title Quick Dash assessment will be less than 40    Time 4   Period Weeks   Status On-going   CC Long Term Goal  #4   Title verbalize a plan to continue with community based exercise program   Time 4   Period Weeks   Status On-going            Plan - 05/28/14 1727    Clinical Impression Statement Alicia Vasquez continues with multiple areas of  pain and functional difficulties with her arm and shoulder as documented by the DASH.  While she says she still cannot walk any distance or stand up for any length of time, she also says that she feels she can walk easier, sometimes even without her cane , than she was able to a month ago.  She wants to continue with therapy and will talk about it at MD appointment next week.  She also wonders when she can return to driving.  E   PT Next Visit Plan Continue to encourage activity around the house.  Update home exercise program to strength ABC program        Problem List Patient Active Problem  List   Diagnosis Date Noted  . Vitamin D deficiency 04/23/2014  . Hyperparathyroidism 04/23/2014  . Depression 07/18/2013  . Breast cancer  of upper-outer quadrant of right female breast 07/15/2013  . Encounter for therapeutic drug monitoring 07/05/2013  . Overactive bladder 01/30/2013  . DVT (deep venous thrombosis), left 12/19/2012  . HTN (hypertension) 09/27/2011  . Hearing loss 09/27/2011  . Chest wall pain 09/27/2011  . Seasonal allergies 09/27/2011  . SOB (shortness of breath), related to deconditioning 08/26/2011  . Hypothyroid 08/26/2011   [name]. Owens Shark, PT   05/28/2014, 5:36 PM  [place], [name], [address] Phone: [phone]   Fax:  [phone]

## 2014-05-28 NOTE — Addendum Note (Signed)
Addended by: Kipp Laurence on: 05/28/2014 05:40 PM   Modules accepted: Orders

## 2014-06-02 DIAGNOSIS — K117 Disturbances of salivary secretion: Secondary | ICD-10-CM | POA: Diagnosis not present

## 2014-06-02 DIAGNOSIS — M2669 Other specified disorders of temporomandibular joint: Secondary | ICD-10-CM | POA: Diagnosis not present

## 2014-06-02 DIAGNOSIS — R490 Dysphonia: Secondary | ICD-10-CM | POA: Diagnosis not present

## 2014-06-04 ENCOUNTER — Ambulatory Visit: Payer: Medicare Other | Attending: Oncology | Admitting: Physical Therapy

## 2014-06-04 DIAGNOSIS — C50911 Malignant neoplasm of unspecified site of right female breast: Secondary | ICD-10-CM | POA: Diagnosis not present

## 2014-06-04 DIAGNOSIS — I890 Lymphedema, not elsewhere classified: Secondary | ICD-10-CM | POA: Insufficient documentation

## 2014-06-04 DIAGNOSIS — M24511 Contracture, right shoulder: Secondary | ICD-10-CM | POA: Diagnosis not present

## 2014-06-04 DIAGNOSIS — M549 Dorsalgia, unspecified: Secondary | ICD-10-CM | POA: Diagnosis not present

## 2014-06-04 DIAGNOSIS — M25511 Pain in right shoulder: Secondary | ICD-10-CM

## 2014-06-04 DIAGNOSIS — Z9889 Other specified postprocedural states: Secondary | ICD-10-CM | POA: Diagnosis not present

## 2014-06-04 DIAGNOSIS — R5381 Other malaise: Secondary | ICD-10-CM

## 2014-06-04 DIAGNOSIS — M542 Cervicalgia: Secondary | ICD-10-CM | POA: Diagnosis not present

## 2014-06-04 NOTE — Therapy (Signed)
[place], [name], [address] Phone: [phone]   Fax:  [phone]  Physical Therapy Treatment  Patient Details  Name: Alicia Vasquez MRN: [id number] Date of Birth: 22-Apr-1941  Encounter Date: 06/04/2014      PT End of Session - 06/04/14 1240    Visit Number 85   Number of Visits 32   Date for PT Re-Evaluation 06/25/14   PT Start Time 1020   PT Stop Time 1100   PT Time Calculation (min) 40 min      Past Medical History  Diagnosis Date  . Allergy   . Hypertension   . Hypothyroid   . Blood transfusion without reported diagnosis   . Pneumonia   . DVT (deep vein thrombosis) in pregnancy   . Breast cancer 07/12/13    invasive mammary carcinoma  . Anxiety   . Arthritis   . Radiation 11/21/13-01/07/14    Right Breast/supraclav.    Past Surgical History  Procedure Laterality Date  . Removal of teeth  04/19/2012    13 teeth removed  . Biospy      female organs  . Dilation and curettage of uterus    . Breast lumpectomy with radioactive seed localization Right 09/09/2013    Procedure: BREAST LUMPECTOMY WITH RADIOACTIVE SEED LOCALIZATION WITH AXILLARY NODE EXCISION;  Surgeon: [name], MD;  Location: [facility];  Service: General;  Laterality: Right;  . Re-excision of breast lumpectomy Right 09/24/2013    Procedure: RE-EXCISION OF RIGHT BREAST LUMPECTOMY;  Surgeon: [name], MD;  Location: [facility];  Service: General;  Laterality: Right;    There were no vitals taken for this visit.  Visit Diagnosis:  Pain in joint, shoulder region, right  Physical deconditioning      Subjective Assessment - 06/04/14 1034    Symptoms (p) Pt having difficutly with morning appointement, but overall feels that she is moving better   Currently in Pain? (p) Yes   Pain Score (p) 3    Pain Location (p) Shoulder   Pain Orientation (p) Right   Pain Type (p) Chronic  pain                    [facility] Adult PT Treatment/Exercise - 06/04/14 1229    Neck Exercises: Seated   Cervical Rotation --  2 reps   Lumbar Exercises: Machines for Strengthening   Leg Press  2 sets of 10 reps with 1 plate   Lumbar Exercises: Standing   Heel Raises 10 reps  2 sets   Functional Squats 5 reps;Limitations  2 setsbolster places on mat for tactile input   Knee/Hip Exercises: Aerobic   Stationary Bike nustep 11 minutes with arms only   Knee/Hip Exercises: Standing   Other Standing Knee Exercises standing hip abduction 5 reps with each leg   Shoulder Exercises: Supine   Other Supine Exercises chest press 2 sets of 5   Shoulder Exercises: Standing   Other Standing Exercises scaption x 10 reps with back against the wall   Shoulder Exercises: ROM/Strengthening   "W" Arms 5 reps  2 sets   Other ROM/Strengthening Exercises wall stretches right arm chest and shoulder                   Short Term Clinic Goals - 04/29/14 1533    CC Short Term Goal  #1   Title z  [facility] Goals - 06/04/14 1247    CC Long Term Goal  #1   Title independence in updated home exercise program   Time 4   Period Weeks   Status On-going   CC Long Term Goal  #2   Title tolerate nu step for 30 minutes to incrase exercise tolerance   Time 4   Period Weeks   Status On-going   CC Long Term Goal  #3   Title Quick Dash assessment will be less than 40    Time 4   Status On-going   CC Long Term Goal  #4   Title verbalize a plan to continue with community based exercise program   Time 4   Period Weeks   Status On-going            Plan - 06/04/14 1241    Clinical Impression Statement Alicia Vasquez appears to be walking better today and has less complain of pain even though she reports she usually has more trouble in the mornings. Began Strength ABC routine today with the hopes this routine becoming a habit that she can carry forward to home  exercise   PT Next Visit Plan Progress with strength after breast cancer program with pt moving toward independence in performing and logging it        Problem List Patient Active Problem List   Diagnosis Date Noted  . Vitamin D deficiency 04/23/2014  . Hyperparathyroidism 04/23/2014  . Depression 07/18/2013  . Breast cancer of upper-outer quadrant of right female breast 07/15/2013  . Encounter for therapeutic drug monitoring 07/05/2013  . Overactive bladder 01/30/2013  . DVT (deep venous thrombosis), left 12/19/2012  . HTN (hypertension) 09/27/2011  . Hearing loss 09/27/2011  . Chest wall pain 09/27/2011  . Seasonal allergies 09/27/2011  . SOB (shortness of breath), related to deconditioning 08/26/2011  . Hypothyroid 08/26/2011   [name]. Owens Shark, PT  06/04/2014, 12:48 PM  [place], [name], [address] Phone: [phone]   Fax:  [phone]

## 2014-06-05 ENCOUNTER — Ambulatory Visit (INDEPENDENT_AMBULATORY_CARE_PROVIDER_SITE_OTHER): Payer: Medicare Other | Admitting: Family Medicine

## 2014-06-05 ENCOUNTER — Encounter: Payer: Self-pay | Admitting: Family Medicine

## 2014-06-05 DIAGNOSIS — R0789 Other chest pain: Secondary | ICD-10-CM

## 2014-06-05 LAB — COMPREHENSIVE METABOLIC PANEL
ALT: 24 U/L (ref 0–35)
AST: 20 U/L (ref 0–37)
Albumin: 4.3 g/dL (ref 3.5–5.2)
Alkaline Phosphatase: 83 U/L (ref 39–117)
BUN: 16 mg/dL (ref 6–23)
CO2: 28 mEq/L (ref 19–32)
Calcium: 10.6 mg/dL — ABNORMAL HIGH (ref 8.4–10.5)
Chloride: 102 mEq/L (ref 96–112)
Creat: 0.97 mg/dL (ref 0.50–1.10)
Glucose, Bld: 94 mg/dL (ref 70–99)
Potassium: 4.3 mEq/L (ref 3.5–5.3)
Sodium: 137 mEq/L (ref 135–145)
Total Bilirubin: 0.4 mg/dL (ref 0.2–1.2)
Total Protein: 7 g/dL (ref 6.0–8.3)

## 2014-06-05 MED ORDER — OXYCODONE HCL 5 MG PO TABS: 5.0000 mg | ORAL_TABLET | Freq: Four times a day (QID) | ORAL | Status: DC | PRN

## 2014-06-05 NOTE — Progress Notes (Signed)
   Subjective:    Patient ID: Alicia Vasquez, female    DOB: 13-Oct-1940, 74 y.o.   MRN: [id number] This chart was scribed for Alicia Haber, MD by Zola Button, Medical Scribe. This patient was seen in Room 25 and the patient's care was started at 3:11 PM.   HPI HPI Comments: [name] is a 74 y.o. female with a hx of hypothyroid and hyperparathyroidism who presents to the [facility] for a follow-up for breast cancer of upper-outer quadrant of right breast. She has gone through radiation for her breast cancer. Patient still notes some pain and swelling on her right breast. She also has some SOB, sneezing, coughing, left ear hearing issues and voice hoarseness. She has been doing physical therapy. Yesterday, she forgot to take her vitamins. She has not had a flu shot yet.  Review of Systems  HENT: Positive for hearing loss, sneezing and voice change.   Respiratory: Positive for cough and shortness of breath.        Objective:   Physical Exam CONSTITUTIONAL: Well developed/well nourished HEAD: Normocephalic/atraumatic EYES: EOM/PERRL ENMT: Mucous membranes moist NECK: supple no meningeal signs SPINE: entire spine nontender CV: S1/S2 noted, no murmurs/rubs/gallops noted LUNGS: Lungs are clear to auscultation bilaterally, no apparent distress ABDOMEN: soft, nontender, no rebound or guarding GU: no cva tenderness NEURO: Pt is awake/alert, moves all extremitiesx4 EXTREMITIES: pulses normal, full ROM SKIN:  PSYCH: no abnormalities of mood noted        Assessment & Plan:   This chart was scribed in my presence and reviewed by me personally.    ICD-9-CM ICD-10-CM   1. Hypercalcemia 275.42 E83.52 Comprehensive metabolic panel  2. Chest wall pain 786.52 R07.89 oxyCODONE (OXY IR/ROXICODONE) 5 MG immediate release tablet   Recheck one month  Signed, Alicia Haber, MD

## 2014-06-07 ENCOUNTER — Other Ambulatory Visit (INDEPENDENT_AMBULATORY_CARE_PROVIDER_SITE_OTHER): Payer: Medicare Other | Admitting: *Deleted

## 2014-06-07 ENCOUNTER — Telehealth: Payer: Self-pay | Admitting: Oncology

## 2014-06-07 ENCOUNTER — Other Ambulatory Visit: Payer: Self-pay | Admitting: Radiology

## 2014-06-07 NOTE — Progress Notes (Signed)
Pt here for labs only. 

## 2014-06-07 NOTE — Telephone Encounter (Signed)
due to lecture moved 3/3 appt w/GM to 3/2 w/HF per GM. appt for 2/25 remains the same. lmonvm for pt and mailed schedule.

## 2014-06-09 ENCOUNTER — Encounter: Payer: Self-pay | Admitting: *Deleted

## 2014-06-09 ENCOUNTER — Other Ambulatory Visit: Payer: Self-pay | Admitting: Family Medicine

## 2014-06-09 DIAGNOSIS — R7989 Other specified abnormal findings of blood chemistry: Secondary | ICD-10-CM

## 2014-06-09 LAB — PTH, INTACT AND CALCIUM
Calcium: 9.9 mg/dL (ref 8.4–10.5)
PTH: 75 pg/mL — ABNORMAL HIGH (ref 14–64)

## 2014-06-10 ENCOUNTER — Ambulatory Visit (INDEPENDENT_AMBULATORY_CARE_PROVIDER_SITE_OTHER): Payer: Medicare Other | Admitting: Family Medicine

## 2014-06-10 DIAGNOSIS — I82402 Acute embolism and thrombosis of unspecified deep veins of left lower extremity: Secondary | ICD-10-CM

## 2014-06-10 DIAGNOSIS — Z5181 Encounter for therapeutic drug level monitoring: Secondary | ICD-10-CM

## 2014-06-10 LAB — POCT INR
INR: 3.5
INR: 5.6

## 2014-06-11 ENCOUNTER — Ambulatory Visit: Payer: Medicare Other | Admitting: Physical Therapy

## 2014-06-11 ENCOUNTER — Ambulatory Visit (INDEPENDENT_AMBULATORY_CARE_PROVIDER_SITE_OTHER): Payer: Medicare Other | Admitting: Family Medicine

## 2014-06-11 DIAGNOSIS — I890 Lymphedema, not elsewhere classified: Secondary | ICD-10-CM | POA: Diagnosis not present

## 2014-06-11 DIAGNOSIS — Z9889 Other specified postprocedural states: Secondary | ICD-10-CM | POA: Diagnosis not present

## 2014-06-11 DIAGNOSIS — Z5181 Encounter for therapeutic drug level monitoring: Secondary | ICD-10-CM | POA: Diagnosis not present

## 2014-06-11 DIAGNOSIS — R5381 Other malaise: Secondary | ICD-10-CM

## 2014-06-11 DIAGNOSIS — M542 Cervicalgia: Secondary | ICD-10-CM | POA: Diagnosis not present

## 2014-06-11 DIAGNOSIS — C50911 Malignant neoplasm of unspecified site of right female breast: Secondary | ICD-10-CM | POA: Diagnosis not present

## 2014-06-11 DIAGNOSIS — M549 Dorsalgia, unspecified: Secondary | ICD-10-CM | POA: Diagnosis not present

## 2014-06-11 DIAGNOSIS — M25511 Pain in right shoulder: Secondary | ICD-10-CM

## 2014-06-11 DIAGNOSIS — I82402 Acute embolism and thrombosis of unspecified deep veins of left lower extremity: Secondary | ICD-10-CM | POA: Diagnosis not present

## 2014-06-11 DIAGNOSIS — M24511 Contracture, right shoulder: Secondary | ICD-10-CM | POA: Diagnosis not present

## 2014-06-11 LAB — POCT INR: INR: 2.5

## 2014-06-11 NOTE — Therapy (Signed)
[place], [name], [address] Phone: [phone]   Fax:  [phone]  Physical Therapy Treatment  Patient Details  Name: Alicia Vasquez MRN: [id number] Date of Birth: 02-17-1941 Referring Provider:  [name], MD  Encounter Date: 06/11/2014      PT End of Session - 06/11/14 1510    PT Start Time 1419   PT Stop Time 1510   PT Time Calculation (min) 51 min   Activity Tolerance Patient limited by pain      Past Medical History  Diagnosis Date  . Allergy   . Hypertension   . Hypothyroid   . Blood transfusion without reported diagnosis   . Pneumonia   . DVT (deep vein thrombosis) in pregnancy   . Breast cancer 07/12/13    invasive mammary carcinoma  . Anxiety   . Arthritis   . Radiation 11/21/13-01/07/14    Right Breast/supraclav.    Past Surgical History  Procedure Laterality Date  . Removal of teeth  04/19/2012    13 teeth removed  . Biospy      female organs  . Dilation and curettage of uterus    . Breast lumpectomy with radioactive seed localization Right 09/09/2013    Procedure: BREAST LUMPECTOMY WITH RADIOACTIVE SEED LOCALIZATION WITH AXILLARY NODE EXCISION;  Surgeon: [name], MD;  Location: [facility];  Service: General;  Laterality: Right;  . Re-excision of breast lumpectomy Right 09/24/2013    Procedure: RE-EXCISION OF RIGHT BREAST LUMPECTOMY;  Surgeon: [name], MD;  Location: [facility];  Service: General;  Laterality: Right;    There were no vitals taken for this visit.  Visit Diagnosis:  Pain in joint, shoulder region, right  Physical deconditioning      Subjective Assessment - 06/11/14 1420    Symptoms pt states she is still very weak in the mornings, but does better in the afternoon,  She is continuing to have her coumadin checked and her parathyroid checked by Dr. Joseph Art   Currently in Pain? Yes   Pain Score 2    not real pain, its more like tired weakness,   Pain Location Arm   Pain Orientation Right                    [facility] Adult PT Treatment/Exercise - 06/11/14 1445    Neck Exercises: Seated   Cervical Rotation --  2 reps   Lumbar Exercises: Machines for Strengthening   Leg Press  2 sets of 10 reps with 1 plate   Lumbar Exercises: Standing   Heel Raises 10 reps  2 sets   Functional Squats 5 reps;Limitations  2 setsbolster places on mat for tactile input   Knee/Hip Exercises: Aerobic   Stationary Bike nustep 15 minutes with arms only   Knee/Hip Exercises: Standing   Functional Squat 2 sets;10 reps  bolster on mat for sensory feedback   Other Standing Knee Exercises standing hip abduction 5 reps with each leg   Knee/Hip Exercises: Sidelying   Clams 10 reps on each side   Shoulder Exercises: Supine   Other Supine Exercises chest press 2 sets of 10 with no weight   Shoulder Exercises: Standing   Other Standing Exercises scaption x 10 reps with back against the wall   Shoulder Exercises: ROM/Strengthening   "W" Arms 10 reps in standing  2 sets   Other ROM/Strengthening Exercises wall stretches right arm chest and shoulder  Short Term Clinic Goals - 04/29/14 1533    CC Short Term Goal  #1   Title z             Long Term Clinic Goals - 06/04/14 1247    CC Long Term Goal  #1   Title independence in updated home exercise program   Time 4   Period Weeks   Status On-going   CC Long Term Goal  #2   Title tolerate nu step for 30 minutes to incrase exercise tolerance   Time 4   Period Weeks   Status On-going   CC Long Term Goal  #3   Title Quick Dash assessment will be less than 40    Time 4   Status On-going   CC Long Term Goal  #4   Title verbalize a plan to continue with community based exercise program   Time 4   Period Weeks   Status On-going            Plan - 06/11/14 1506    Clinical Impression Statement pt had a  difficutly time today with exercise with pain in right shoulder and pain in right knee limiting some activties. She feels better after exercise but still has multiple complaints she notices that she is able to walk further at home.    PT Next Visit Plan Progress with strength after breast cancer program with pt moving toward independence in performing and logging it        Problem List Patient Active Problem List   Diagnosis Date Noted  . Vitamin D deficiency 04/23/2014  . Hyperparathyroidism 04/23/2014  . Depression 07/18/2013  . Breast cancer of upper-outer quadrant of right female breast 07/15/2013  . Encounter for therapeutic drug monitoring 07/05/2013  . Overactive bladder 01/30/2013  . DVT (deep venous thrombosis), left 12/19/2012  . HTN (hypertension) 09/27/2011  . Hearing loss 09/27/2011  . Chest wall pain 09/27/2011  . Seasonal allergies 09/27/2011  . SOB (shortness of breath), related to deconditioning 08/26/2011  . Hypothyroid 08/26/2011  [name]. Owens Shark PT    [name] 06/11/2014, 3:11 PM  [place], [name], [address] Phone: [phone]   Fax:  [phone]

## 2014-06-13 ENCOUNTER — Telehealth: Payer: Self-pay | Admitting: Family Medicine

## 2014-06-13 ENCOUNTER — Encounter: Payer: Self-pay | Admitting: Family Medicine

## 2014-06-13 NOTE — Telephone Encounter (Signed)
Patient was told by [facility] that she needs a note from Dr. Joseph Art stating that she is diagnosed with hypoglycemia for medicare to pay for her eye exam.  She will come back next week to pick the note up.  [phone]

## 2014-06-16 ENCOUNTER — Ambulatory Visit: Payer: Medicare Other | Admitting: Physical Therapy

## 2014-06-16 DIAGNOSIS — M542 Cervicalgia: Secondary | ICD-10-CM | POA: Diagnosis not present

## 2014-06-16 DIAGNOSIS — R5381 Other malaise: Secondary | ICD-10-CM

## 2014-06-16 DIAGNOSIS — I890 Lymphedema, not elsewhere classified: Secondary | ICD-10-CM | POA: Diagnosis not present

## 2014-06-16 DIAGNOSIS — C50911 Malignant neoplasm of unspecified site of right female breast: Secondary | ICD-10-CM | POA: Diagnosis not present

## 2014-06-16 DIAGNOSIS — M549 Dorsalgia, unspecified: Secondary | ICD-10-CM | POA: Diagnosis not present

## 2014-06-16 DIAGNOSIS — M24511 Contracture, right shoulder: Secondary | ICD-10-CM | POA: Diagnosis not present

## 2014-06-16 DIAGNOSIS — Z9889 Other specified postprocedural states: Secondary | ICD-10-CM | POA: Diagnosis not present

## 2014-06-16 DIAGNOSIS — M25511 Pain in right shoulder: Secondary | ICD-10-CM

## 2014-06-16 NOTE — Therapy (Signed)
[place]lic[place], [place], [address] Phone: [phone]   Fax:  [phone]  Physical Therapy Treatment  Patient Details  Name: Alicia Vasquez MRN: [id number] Date of Birth: [birth year] Referring Provider:  [name], MD  Encounter Date: 06/16/2014      PT End of Session - 06/16/14 1758    Visit Number 40   Number of Visits 77   Date for PT Re-Evaluation 06/25/14   PT Start Time 1430   PT Stop Time 1515   PT Time Calculation (min) 45 min      Past Medical History  Diagnosis Date  . Allergy   . Hypertension   . Hypothyroid   . Blood transfusion without reported diagnosis   . Pneumonia   . DVT (deep vein thrombosis) in pregnancy   . Breast cancer 07/12/13    invasive mammary carcinoma  . Anxiety   . Arthritis   . Radiation 11/21/13-01/07/14    Right Breast/supraclav.    Past Surgical History  Procedure Laterality Date  . Removal of teeth  04/19/2012    13 teeth removed  . Biospy      female organs  . Dilation and curettage of uterus    . Breast lumpectomy with radioactive seed localization Right 09/09/2013    Procedure: BREAST LUMPECTOMY WITH RADIOACTIVE SEED LOCALIZATION WITH AXILLARY NODE EXCISION;  Surgeon: Alicia Bookbinder, MD;  Location: [facility];  Service: General;  Laterality: Right;  . Re-excision of breast lumpectomy Right 09/24/2013    Procedure: RE-EXCISION OF RIGHT BREAST LUMPECTOMY;  Surgeon: Alicia Bookbinder, MD;  Location: [facility];  Service: General;  Laterality: Right;    There were no vitals taken for this visit.  Visit Diagnosis:  Pain in joint, shoulder region, right  Physical deconditioning      Subjective Assessment - 06/16/14 1758    Symptoms I feel better now than i did earlier.                     [facility] Adult PT Treatment/Exercise - 06/16/14 1442    Neck Exercises: Seated   Cervical Rotation --  2 reps   Lateral Flexion Both  2 reps   Shoulder Shrugs 5 reps   Lumbar Exercises: Standing   Heel Raises 10 reps  2 sets   Functional Squats 5 reps;Limitations  2 setsbolster places on mat for tactile input   Lumbar Exercises: Supine   Bent Knee Raise 5 reps  on each leg   Knee/Hip Exercises: Aerobic   Stationary Bike nustep 15 minutes with arms only   Knee/Hip Exercises: Standing   Functional Squat 2 sets;10 reps  bolster on mat for sensory feedback   Other Standing Knee Exercises standing hip abduction 5 reps with each leg   Knee/Hip Exercises: Supine   Quad Sets Limitations 10 reps with 4# on each leg   Straight Leg Raises 5 reps;Both   Knee/Hip Exercises: Sidelying   Clams 10 reps on each side   Shoulder Exercises: Supine   Other Supine Exercises chest press 2 sets of 10 with no weight   Other Supine Exercises supine cane exercise   Shoulder Exercises: Seated   Other Seated Exercises chop and lift 10 times in each direction   Shoulder Exercises: Standing   Other Standing Exercises scaption x 10 reps with back against the wall   Shoulder Exercises: ROM/Strengthening   "W" Arms 10 reps in standing  2 sets  Other ROM/Strengthening Exercises wall stretches right arm chest and shoulder   Moist Heat Therapy   Number Minutes Moist Heat 10 Minutes   Moist Heat Location --  right paracervical area at tender trigger point    Ankle Exercises: Seated   Heel Raises 10 reps                   Short Term Clinic Goals - 04/29/14 1533    CC Short Term Goal  #1   Title z             Long Term Clinic Goals - 06/16/14 1810    CC Long Term Goal  #1   Title independence in updated home exercise program   Time 4   CC Long Term Goal  #2   Title tolerate nu step for 30 minutes to incrase exercise tolerance   Time 4   Period Weeks   Status On-going   CC Long Term Goal  #3   Title Quick Dash assessment will be less than 40    Time 4   Period Weeks   Status On-going   CC  Long Term Goal  #4   Title verbalize a plan to continue with community based exercise program   Time 4   Period Weeks   Status On-going            Plan - 06/16/14 1809    PT Next Visit Plan Progress with strength after breast cancer program with pt moving toward independence in performing and logging it   G code next        Problem List Patient Active Problem List   Diagnosis Date Noted  . Vitamin D deficiency 04/23/2014  . Hyperparathyroidism 04/23/2014  . Depression 07/18/2013  . Breast cancer of upper-outer quadrant of right female breast 07/15/2013  . Encounter for therapeutic drug monitoring 07/05/2013  . Overactive bladder 01/30/2013  . DVT (deep venous thrombosis), left 12/19/2012  . HTN (hypertension) 09/27/2011  . Hearing loss 09/27/2011  . Chest wall pain 09/27/2011  . Seasonal allergies 09/27/2011  . SOB (shortness of breath), related to deconditioning 08/26/2011  . Hypothyroid 08/26/2011   Alicia Vasquez PT   Alicia Vasquez 06/16/2014, [place], [place], [address] Phone: [phone]   Fax:  [phone]

## 2014-06-19 ENCOUNTER — Ambulatory Visit: Payer: Medicare Other | Admitting: Physical Therapy

## 2014-06-19 DIAGNOSIS — I890 Lymphedema, not elsewhere classified: Secondary | ICD-10-CM | POA: Diagnosis not present

## 2014-06-19 DIAGNOSIS — M25511 Pain in right shoulder: Secondary | ICD-10-CM

## 2014-06-19 DIAGNOSIS — M549 Dorsalgia, unspecified: Secondary | ICD-10-CM | POA: Diagnosis not present

## 2014-06-19 DIAGNOSIS — Z9889 Other specified postprocedural states: Secondary | ICD-10-CM | POA: Diagnosis not present

## 2014-06-19 DIAGNOSIS — R5381 Other malaise: Secondary | ICD-10-CM

## 2014-06-19 DIAGNOSIS — M24511 Contracture, right shoulder: Secondary | ICD-10-CM | POA: Diagnosis not present

## 2014-06-19 DIAGNOSIS — M542 Cervicalgia: Secondary | ICD-10-CM | POA: Diagnosis not present

## 2014-06-19 DIAGNOSIS — C50911 Malignant neoplasm of unspecified site of right female breast: Secondary | ICD-10-CM | POA: Diagnosis not present

## 2014-06-19 NOTE — Therapy (Signed)
[place], [name], [address] Phone: [phone]   Fax:  [phone]  Physical Therapy Treatment  Patient Details  Name: Alicia Vasquez MRN: [id number] Date of Birth: Oct 09, 1940 Referring Provider:  [name], MD  Encounter Date: 06/19/2014      PT End of Session - 06/19/14 1711    Visit Number 50   Number of Visits 16   Date for PT Re-Evaluation 06/25/14   PT Start Time 1400   PT Stop Time 1430   PT Time Calculation (min) 30 min      Past Medical History  Diagnosis Date  . Allergy   . Hypertension   . Hypothyroid   . Blood transfusion without reported diagnosis   . Pneumonia   . DVT (deep vein thrombosis) in pregnancy   . Breast cancer 07/12/13    invasive mammary carcinoma  . Anxiety   . Arthritis   . Radiation 11/21/13-01/07/14    Right Breast/supraclav.    Past Surgical History  Procedure Laterality Date  . Removal of teeth  04/19/2012    13 teeth removed  . Biospy      female organs  . Dilation and curettage of uterus    . Breast lumpectomy with radioactive seed localization Right 09/09/2013    Procedure: BREAST LUMPECTOMY WITH RADIOACTIVE SEED LOCALIZATION WITH AXILLARY NODE EXCISION;  Surgeon: [name], MD;  Location: [facility];  Service: General;  Laterality: Right;  . Re-excision of breast lumpectomy Right 09/24/2013    Procedure: RE-EXCISION OF RIGHT BREAST LUMPECTOMY;  Surgeon: [name], MD;  Location: [facility];  Service: General;  Laterality: Right;    There were no vitals taken for this visit.  Visit Diagnosis:  Pain in joint, shoulder region, right  Physical deconditioning      Subjective Assessment - 06/19/14 1402    Symptoms pt late for appointment.  She was getting her presciptions filled at [facility]   Currently in Pain? Yes   Pain Score 3    Pain Location Axilla   Pain Orientation Right                     [facility] Adult PT Treatment/Exercise - 06/19/14 1412    Neck Exercises: Seated   Cervical Rotation --  2 reps   Lateral Flexion Both  2 reps   Shoulder Shrugs 5 reps   Lumbar Exercises: Standing   Heel Raises 10 reps  2 sets   Functional Squats 5 reps;Limitations  2 setsbolster places on mat for tactile input   Knee/Hip Exercises: Aerobic   Stationary Bike nustep 15 minutes with arms only   Knee/Hip Exercises: Standing   Heel Raises 2 sets;5 reps   Forward Lunges Right;Left;5 reps   Forward Step Up 10 reps;Step Height: 4"   Functional Squat 2 sets;10 reps  bolster on mat for sensory feedback   Other Standing Knee Exercises standing hip abduction 5 reps with each leg x 2 sets   Knee/Hip Exercises: Supine   Straight Leg Raises 5 reps;Both   Knee/Hip Exercises: Sidelying   Clams 10 reps on each side   Shoulder Exercises: Seated   Other Seated Exercises chop and lift 10 times in each direction   Shoulder Exercises: Standing   Other Standing Exercises scaption x 10 reps with back against the wall   Other Standing Exercises tricip kickbacks  2# x 10 reps    Shoulder Exercises: ROM/Strengthening   "  W" Arms 10 reps in standing  2 sets   Other ROM/Strengthening Exercises wall stretches right arm chest and shoulder   Moist Heat Therapy   Moist Heat Location --  right paracervical area at tender trigger point    Ankle Exercises: Seated   Heel Raises 10 reps                   Short Term Clinic Goals - 04/29/14 1533    CC Short Term Goal  #1   Title z             Long Term Clinic Goals - 06/16/14 1810    CC Long Term Goal  #1   Title independence in updated home exercise program   Time 4   CC Long Term Goal  #2   Title tolerate nu step for 30 minutes to incrase exercise tolerance   Time 4   Period Weeks   Status On-going   CC Long Term Goal  #3   Title Quick Dash assessment will be less than 40    Time 4   Period Weeks    Status On-going   CC Long Term Goal  #4   Title verbalize a plan to continue with community based exercise program   Time 4   Period Weeks   Status On-going            Plan - 07/08/14 1712    Clinical Impression Statement Pt reports she has been walking around [employer] today and is fatigued. She continues to have multiple complaints of pain that varies in intensity and location   PT Next Visit Plan Progress with strength after breast cancer program, reassess goals next visit           G-Codes - July 08, 2014 1716    Functional Assessment Tool Used clinical judgement   Functional Limitation Other PT primary   Other PT Primary Current Status (Z9935) At least 40 percent but less than 60 percent impaired, limited or restricted   Other PT Primary Goal Status (T0177) At least 20 percent but less than 40 percent impaired, limited or restricted      Problem List Patient Active Problem List   Diagnosis Date Noted  . Vitamin D deficiency 04/23/2014  . Hyperparathyroidism 04/23/2014  . Depression 07/18/2013  . Breast cancer of upper-outer quadrant of right female breast 07/15/2013  . Encounter for therapeutic drug monitoring 07/05/2013  . Overactive bladder 01/30/2013  . DVT (deep venous thrombosis), left 12/19/2012  . HTN (hypertension) 09/27/2011  . Hearing loss 09/27/2011  . Chest wall pain 09/27/2011  . Seasonal allergies 09/27/2011  . SOB (shortness of breath), related to deconditioning 08/26/2011  . Hypothyroid 08/26/2011   [name]. Owens Shark, [place]   08-Jul-2014, 5:17 PM  [place], [name], [address] Phone: [phone]   Fax:  [phone]

## 2014-06-25 ENCOUNTER — Ambulatory Visit: Payer: Medicare Other | Admitting: Physical Therapy

## 2014-06-25 DIAGNOSIS — R5381 Other malaise: Secondary | ICD-10-CM

## 2014-06-25 DIAGNOSIS — M549 Dorsalgia, unspecified: Secondary | ICD-10-CM | POA: Diagnosis not present

## 2014-06-25 DIAGNOSIS — M24511 Contracture, right shoulder: Secondary | ICD-10-CM | POA: Diagnosis not present

## 2014-06-25 DIAGNOSIS — C50911 Malignant neoplasm of unspecified site of right female breast: Secondary | ICD-10-CM | POA: Diagnosis not present

## 2014-06-25 DIAGNOSIS — M25511 Pain in right shoulder: Secondary | ICD-10-CM

## 2014-06-25 DIAGNOSIS — I890 Lymphedema, not elsewhere classified: Secondary | ICD-10-CM | POA: Diagnosis not present

## 2014-06-25 DIAGNOSIS — Z9889 Other specified postprocedural states: Secondary | ICD-10-CM | POA: Diagnosis not present

## 2014-06-25 DIAGNOSIS — M542 Cervicalgia: Secondary | ICD-10-CM | POA: Diagnosis not present

## 2014-06-26 NOTE — Therapy (Signed)
[place], [name], [address] Phone: [phone]   Fax:  [phone]  Physical Therapy Treatment  Patient Details  Name: Alicia Vasquez MRN: [id number] Date of Birth: [birth year] Referring Provider:  [name], MD  Encounter Date: 06/25/2014      PT End of Session - 06/26/14 1035    Visit Number 51   Number of Visits 65   Date for PT Re-Evaluation 07/26/14   PT Start Time 1430   PT Stop Time 1515   PT Time Calculation (min) 45 min   Activity Tolerance Patient limited by fatigue;Patient limited by pain  complains of shortness of breath with activity   Behavior During Therapy [facility] for tasks assessed/performed      Past Medical History  Diagnosis Date  . Allergy   . Hypertension   . Hypothyroid   . Blood transfusion without reported diagnosis   . Pneumonia   . DVT (deep vein thrombosis) in pregnancy   . Breast cancer 07/12/13    invasive mammary carcinoma  . Anxiety   . Arthritis   . Radiation 11/21/13-01/07/14    Right Breast/supraclav.    Past Surgical History  Procedure Laterality Date  . Removal of teeth  04/19/2012    13 teeth removed  . Biospy      female organs  . Dilation and curettage of uterus    . Breast lumpectomy with radioactive seed localization Right 09/09/2013    Procedure: BREAST LUMPECTOMY WITH RADIOACTIVE SEED LOCALIZATION WITH AXILLARY NODE EXCISION;  Surgeon: [name], MD;  Location: [place];  Service: General;  Laterality: Right;  . Re-excision of breast lumpectomy Right 09/24/2013    Procedure: RE-EXCISION OF RIGHT BREAST LUMPECTOMY;  Surgeon: [name], MD;  Location: [facility];  Service: General;  Laterality: Right;    There were no vitals taken for this visit.  Visit Diagnosis:  Physical deconditioning  Pain in joint, shoulder region, right      Subjective Assessment - 06/25/14 1437    Symptoms pt  states she climbed up on a cedar chest and bumped her knee. She still complains of getting short of breath .  She took tylenol today and do pain is not so bad.   Currently in Pain? Yes   Pain Score 2    Pain Location --  knees and lower back                    [facility] Adult PT Treatment/Exercise - 06/26/14 1026    Lumbar Exercises: Standing   Heel Raises 10 reps  2 sets   Functional Squats 10 reps  2 sets from high mat   Lumbar Exercises: Supine   Bridge 10 reps   Other Supine Lumbar Exercises lower trunk rotations   Knee/Hip Exercises: Stretches   Active Hamstring Stretch 2 reps   Knee/Hip Exercises: Standing   Heel Raises 2 sets;5 reps   Forward Step Up 10 reps;Step Height: 4"  leading with right leg   Functional Squat --  bolster on mat for sensory feedback   Other Standing Knee Exercises standing hip abduction 5 reps with each leg x 2 sets   Knee/Hip Exercises: Supine   Straight Leg Raises 5 reps;Both   Knee/Hip Exercises: Sidelying   Clams 10 reps on each side   Shoulder Exercises: Supine   Other Supine Exercises chest press with 1 # 2 sets of 10   Shoulder Exercises: Seated  Other Seated Exercises chop and lift 10 times in each direction   Shoulder Exercises: Standing   Other Standing Exercises scaption x 10 reps with back against the wall   Other Standing Exercises tricip kickbacks  2# x 10 reps    Shoulder Exercises: ROM/Strengthening   "W" Arms --   Other ROM/Strengthening Exercises one armed row with right arm 2 sets of 10 reps with no weight   Shoulder Exercises: Stretch   Cross Chest Stretch 2 reps   Wall Stretch - Flexion 2 reps                PT Education - 06/26/14 1034    Education provided Yes   Education Details pt issued Strength ABC packet and log and asked to bring log back next treatment with comments about how it went when she tried to do it at home   Person(s) Educated Patient   Methods Explanation;Demonstration;Tactile  cues;Verbal cues   Comprehension Need further instruction           Short Term Clinic Goals - 04/29/14 1533    CC Short Term Goal  #1   Title z             Long Term Clinic Goals - 06/26/14 1043    CC Long Term Goal  #1   Title independence in updated home exercise program   Status On-going   CC Long Term Goal  #2   Title tolerate nu step for 30 minutes to incrase exercise tolerance   Status On-going   CC Long Term Goal  #3   Title Quick Dash assessment will be less than 40    Status On-going   CC Long Term Goal  #4   Title verbalize a plan to continue with community based exercise program   Status On-going            Plan - 06/26/14 1035    Clinical Impression Statement Instructed in Strength ABC program and logging with postive reinforcement and encouragement for pt to do the exercise as best as she can.  Feel that repetition of same routine will help pt with carry over to home exercise.   Pt continues with multiple complaints of pain, shortness of breath and needed frequent sitting rest breaks. Renewal due so sent to Dr. Joseph Art for 4 more weeks, but feel that pt is appoaching discharge  as we are seeing minimal overall improvement or decrease in symptoms..Will focus on compliance with home program.;   PT Next Visit Plan Assess if pt brings home program and log back and if she followed through with exercise at home.  If not, make plans to discharge her soon.        Problem List Patient Active Problem List   Diagnosis Date Noted  . Vitamin D deficiency 04/23/2014  . Hyperparathyroidism 04/23/2014  . Depression 07/18/2013  . Breast cancer of upper-outer quadrant of right female breast 07/15/2013  . Encounter for therapeutic drug monitoring 07/05/2013  . Overactive bladder 01/30/2013  . DVT (deep venous thrombosis), left 12/19/2012  . HTN (hypertension) 09/27/2011  . Hearing loss 09/27/2011  . Chest wall pain 09/27/2011  . Seasonal allergies 09/27/2011   . SOB (shortness of breath), related to deconditioning 08/26/2011  . Hypothyroid 08/26/2011   [name]. Owens Shark, PT   06/26/2014, 10:44 AM  [place], [name], [address] Phone: [phone]   Fax:  [phone]

## 2014-06-27 ENCOUNTER — Ambulatory Visit: Payer: Medicare Other | Admitting: Physical Therapy

## 2014-06-27 DIAGNOSIS — Z9889 Other specified postprocedural states: Secondary | ICD-10-CM | POA: Diagnosis not present

## 2014-06-27 DIAGNOSIS — C50911 Malignant neoplasm of unspecified site of right female breast: Secondary | ICD-10-CM | POA: Diagnosis not present

## 2014-06-27 DIAGNOSIS — R5381 Other malaise: Secondary | ICD-10-CM

## 2014-06-27 DIAGNOSIS — M24511 Contracture, right shoulder: Secondary | ICD-10-CM | POA: Diagnosis not present

## 2014-06-27 DIAGNOSIS — M542 Cervicalgia: Secondary | ICD-10-CM | POA: Diagnosis not present

## 2014-06-27 DIAGNOSIS — M25511 Pain in right shoulder: Secondary | ICD-10-CM

## 2014-06-27 DIAGNOSIS — I890 Lymphedema, not elsewhere classified: Secondary | ICD-10-CM | POA: Diagnosis not present

## 2014-06-27 DIAGNOSIS — M549 Dorsalgia, unspecified: Secondary | ICD-10-CM | POA: Diagnosis not present

## 2014-06-27 NOTE — Therapy (Signed)
[place], [name], [address] Phone: [phone]   Fax:  [phone]  Physical Therapy Treatment  Patient Details  Name: Alicia Vasquez MRN: [id number] Date of Birth: Sep 10, 1940 Referring Provider:  [name], MD  Encounter Date: 06/27/2014      PT End of Session - 06/27/14 1309    Visit Number 79   Number of Visits 65   Date for PT Re-Evaluation 07/26/14   PT Start Time 1110   PT Stop Time 1150   PT Time Calculation (min) 40 min      Past Medical History  Diagnosis Date  . Allergy   . Hypertension   . Hypothyroid   . Blood transfusion without reported diagnosis   . Pneumonia   . DVT (deep vein thrombosis) in pregnancy   . Breast cancer 07/12/13    invasive mammary carcinoma  . Anxiety   . Arthritis   . Radiation 11/21/13-01/07/14    Right Breast/supraclav.    Past Surgical History  Procedure Laterality Date  . Removal of teeth  04/19/2012    13 teeth removed  . Biospy      female organs  . Dilation and curettage of uterus    . Breast lumpectomy with radioactive seed localization Right 09/09/2013    Procedure: BREAST LUMPECTOMY WITH RADIOACTIVE SEED LOCALIZATION WITH AXILLARY NODE EXCISION;  Surgeon: [name], MD;  Location: [facility];  Service: General;  Laterality: Right;  . Re-excision of breast lumpectomy Right 09/24/2013    Procedure: RE-EXCISION OF RIGHT BREAST LUMPECTOMY;  Surgeon: [name], MD;  Location: [facility];  Service: General;  Laterality: Right;    There were no vitals taken for this visit.  Visit Diagnosis:  Physical deconditioning  Pain in joint, shoulder region, right      Subjective Assessment - 06/27/14 1114    Symptoms pt says she did her exercise program yesterday and filled out the log, but did not bring her sheet back in.  She said she did alot of walking yesterday .   Currently in Pain? Yes   Pain  Score 3    Pain Location Back   Pain Orientation Right   Pain Radiating Towards to back and waist.                      [facility] Adult PT Treatment/Exercise - 06/27/14 1119    Lumbar Exercises: Standing   Heel Raises 10 reps  2 sets   Functional Squats 10 reps  2 sets from high mat   Lumbar Exercises: Supine   Bridge 10 reps   Other Supine Lumbar Exercises lower trunk rotations   Knee/Hip Exercises: Stretches   Active Hamstring Stretch 2 reps   Knee/Hip Exercises: Standing   Heel Raises 2 sets;5 reps   Forward Step Up 10 reps;Step Height: 4"  leading with right leg   Functional Squat 2 sets;10 reps  from high step    Other Standing Knee Exercises standing hip abduction 10  reps with each leg x 2 sets   Knee/Hip Exercises: Sidelying   Clams 10 reps on each side   Shoulder Exercises: Supine   Other Supine Exercises chest press with 1 # 2 sets of 10   Shoulder Exercises: Standing   Other Standing Exercises scaption x 10 reps with back against the wall   Other Standing Exercises tricip kickbacks  1# x 10 reps    Shoulder Exercises: ROM/Strengthening  Other ROM/Strengthening Exercises one armed row with right arm 2 sets of 10 reps with no weight   Shoulder Exercises: Stretch   Cross Chest Stretch 2 reps   Wall Stretch - Flexion 2 reps                PT Education - 06/27/14 1309    Education Details Strenth ABC exercise and log    Person(s) Educated Patient   Methods Explanation;Demonstration;Tactile cues;Verbal cues   Comprehension Need further instruction           Short Term Clinic Goals - 04/29/14 1533    CC Short Term Goal  #1   Title z             Long Term Clinic Goals - 06/26/14 1043    CC Long Term Goal  #1   Title independence in updated home exercise program   Status On-going   CC Long Term Goal  #2   Title tolerate nu step for 30 minutes to incrase exercise tolerance   Status On-going   CC Long Term Goal  #3   Title Quick  Dash assessment will be less than 40    Status On-going   CC Long Term Goal  #4   Title verbalize a plan to continue with community based exercise program   Status On-going            Plan - 06/27/14 1310    Clinical Impression Statement Reviewed entire Strength ABC program and had patient manually fill out the log. She needed frequent verbal, visual and tactile cues despite just doing the exercises 2 days ago and saying she did them on her own yesterday.     PT Next Visit Plan continue with same exercise routine and logging reinforcing biomechanical form with repetition.        Problem List Patient Active Problem List   Diagnosis Date Noted  . Vitamin D deficiency 04/23/2014  . Hyperparathyroidism 04/23/2014  . Depression 07/18/2013  . Breast cancer of upper-outer quadrant of right female breast 07/15/2013  . Encounter for therapeutic drug monitoring 07/05/2013  . Overactive bladder 01/30/2013  . DVT (deep venous thrombosis), left 12/19/2012  . HTN (hypertension) 09/27/2011  . Hearing loss 09/27/2011  . Chest wall pain 09/27/2011  . Seasonal allergies 09/27/2011  . SOB (shortness of breath), related to deconditioning 08/26/2011  . Hypothyroid 08/26/2011   [name]. Owens Shark, PT   06/27/2014, 1:15 PM  [place], [name], [address] Phone: [phone]   Fax:  [phone]

## 2014-06-27 NOTE — Patient Instructions (Signed)
Complete the exercise and exercise log one more time at home prior to next visit. Please bring completed log next visit

## 2014-06-30 DIAGNOSIS — H2513 Age-related nuclear cataract, bilateral: Secondary | ICD-10-CM | POA: Diagnosis not present

## 2014-07-01 ENCOUNTER — Ambulatory Visit: Payer: Medicare Other | Attending: Oncology | Admitting: Physical Therapy

## 2014-07-01 DIAGNOSIS — M542 Cervicalgia: Secondary | ICD-10-CM | POA: Insufficient documentation

## 2014-07-01 DIAGNOSIS — C50911 Malignant neoplasm of unspecified site of right female breast: Secondary | ICD-10-CM | POA: Insufficient documentation

## 2014-07-01 DIAGNOSIS — I890 Lymphedema, not elsewhere classified: Secondary | ICD-10-CM | POA: Insufficient documentation

## 2014-07-01 DIAGNOSIS — Z9889 Other specified postprocedural states: Secondary | ICD-10-CM | POA: Diagnosis not present

## 2014-07-01 DIAGNOSIS — M549 Dorsalgia, unspecified: Secondary | ICD-10-CM | POA: Diagnosis not present

## 2014-07-01 DIAGNOSIS — R5381 Other malaise: Secondary | ICD-10-CM

## 2014-07-01 DIAGNOSIS — M25511 Pain in right shoulder: Secondary | ICD-10-CM

## 2014-07-01 DIAGNOSIS — M24511 Contracture, right shoulder: Secondary | ICD-10-CM | POA: Insufficient documentation

## 2014-07-02 NOTE — Therapy (Signed)
[place], [name], [address] Phone: [phone]   Fax:  [phone]  Physical Therapy Treatment  Patient Details  Name: Alicia Vasquez MRN: [id number] Date of Birth: 10/04/1940 Referring Provider:  [name], MD  Encounter Date: 07/01/2014      PT End of Session - 07/01/14 1548    Visit Number 28   Number of Visits 65   Date for PT Re-Evaluation 07/26/14   PT Start Time 7253   PT Stop Time 1555   PT Time Calculation (min) 40 min      Past Medical History  Diagnosis Date  . Allergy   . Hypertension   . Hypothyroid   . Blood transfusion without reported diagnosis   . Pneumonia   . DVT (deep vein thrombosis) in pregnancy   . Breast cancer 07/12/13    invasive mammary carcinoma  . Anxiety   . Arthritis   . Radiation 11/21/13-01/07/14    Right Breast/supraclav.    Past Surgical History  Procedure Laterality Date  . Removal of teeth  04/19/2012    13 teeth removed  . Biospy      female organs  . Dilation and curettage of uterus    . Breast lumpectomy with radioactive seed localization Right 09/09/2013    Procedure: BREAST LUMPECTOMY WITH RADIOACTIVE SEED LOCALIZATION WITH AXILLARY NODE EXCISION;  Surgeon: [name], MD;  Location: [facility];  Service: General;  Laterality: Right;  . Re-excision of breast lumpectomy Right 09/24/2013    Procedure: RE-EXCISION OF RIGHT BREAST LUMPECTOMY;  Surgeon: [name], MD;  Location: [place];  Service: General;  Laterality: Right;    There were no vitals taken for this visit.  Visit Diagnosis:  No diagnosis found.      Subjective Assessment - 07/01/14 1517    Symptoms pt tried to do exercise program at home, but was only able to do 5 repetitions.  She did not do the log. She had the most trouble with the tricep exercise and is having some back pain.  she went to the eye doctor yesterday and will  need to get new glasses.   Currently in Pain? Yes   Pain Score 3    Pain Location Back   Pain Orientation Right   Pain Descriptors / Indicators Aching                    [facility] Adult PT Treatment/Exercise - 07/01/14 1532    Lumbar Exercises: Supine   Bridge 5 reps   Other Supine Lumbar Exercises lower trunk rotations   Knee/Hip Exercises: Stretches   Active Hamstring Stretch 2 reps   Knee/Hip Exercises: Standing   Heel Raises 2 sets;5 reps   Forward Step Up 10 reps;Step Height: 6"   Functional Squat 2 sets;10 reps   Other Standing Knee Exercises standing hip abduction 10  reps with each leg    Knee/Hip Exercises: Sidelying   Clams 10 reps on each side   Shoulder Exercises: Standing   Other Standing Exercises scaption x 10 reps with back against the wall   Other Standing Exercises bicep curls  2 sets of 10 with 2 #   Shoulder Exercises: ROM/Strengthening   "W" Arms 10 reps in standing                   Short Term Clinic Goals - 04/29/14 1533    CC Short Term Goal  #1  Title z             Long Term Clinic Goals - 06/26/14 1043    CC Long Term Goal  #1   Title independence in updated home exercise program   Status On-going   CC Long Term Goal  #2   Title tolerate nu step for 30 minutes to incrase exercise tolerance   Status On-going   CC Long Term Goal  #3   Title Quick Dash assessment will be less than 40    Status On-going   CC Long Term Goal  #4   Title verbalize a plan to continue with community based exercise program   Status On-going            Plan - 07/01/14 1548    Clinical Impression Statement Reviewed entire strength ABC program with the exception of tricep curls that pt states were too difficult -- causing back pain. pt continues to need mutliple verbal and tactile cues and does not seem to remember the exercises from session to session. She continues to need rest breaks during session witih complaints of fatigue    PT Next  Visit Plan continue with same exercise routine and logging reinforcing biomechanical form with repetition.   PT Plan Continue with exercise program and encourage patient intiatation to facilitate carry over at home.        Problem List Patient Active Problem List   Diagnosis Date Noted  . Vitamin D deficiency 04/23/2014  . Hyperparathyroidism 04/23/2014  . Depression 07/18/2013  . Breast cancer of upper-outer quadrant of right female breast 07/15/2013  . Encounter for therapeutic drug monitoring 07/05/2013  . Overactive bladder 01/30/2013  . DVT (deep venous thrombosis), left 12/19/2012  . HTN (hypertension) 09/27/2011  . Hearing loss 09/27/2011  . Chest wall pain 09/27/2011  . Seasonal allergies 09/27/2011  . SOB (shortness of breath), related to deconditioning 08/26/2011  . Hypothyroid 08/26/2011   [name]. Owens Shark PT  [name] 07/02/2014, 7:43 AM  [place], [name], [address] Phone: [phone]   Fax:  [phone]

## 2014-07-03 ENCOUNTER — Telehealth: Payer: Self-pay

## 2014-07-03 ENCOUNTER — Ambulatory Visit: Payer: Medicare Other | Admitting: Physical Therapy

## 2014-07-03 DIAGNOSIS — M25511 Pain in right shoulder: Secondary | ICD-10-CM

## 2014-07-03 DIAGNOSIS — Z9889 Other specified postprocedural states: Secondary | ICD-10-CM | POA: Diagnosis not present

## 2014-07-03 DIAGNOSIS — R5381 Other malaise: Secondary | ICD-10-CM

## 2014-07-03 DIAGNOSIS — M24511 Contracture, right shoulder: Secondary | ICD-10-CM | POA: Diagnosis not present

## 2014-07-03 DIAGNOSIS — M542 Cervicalgia: Secondary | ICD-10-CM | POA: Diagnosis not present

## 2014-07-03 DIAGNOSIS — M549 Dorsalgia, unspecified: Secondary | ICD-10-CM | POA: Diagnosis not present

## 2014-07-03 DIAGNOSIS — I890 Lymphedema, not elsewhere classified: Secondary | ICD-10-CM | POA: Diagnosis not present

## 2014-07-03 DIAGNOSIS — C50911 Malignant neoplasm of unspecified site of right female breast: Secondary | ICD-10-CM | POA: Diagnosis not present

## 2014-07-03 NOTE — Patient Instructions (Signed)
Pt to continue with exercise at home

## 2014-07-03 NOTE — Telephone Encounter (Signed)
The PTH is only slightly elevated.  We should recheck this in March to determine if this is a persistent elevation.  No immediate consultation is necessary.

## 2014-07-03 NOTE — Therapy (Signed)
[place], [name], [address] Phone: [phone]   Fax:  [phone]  Physical Therapy Treatment  Patient Details  Name: Alicia Vasquez MRN: [id number] Date of Birth: 1940-07-16 Referring Provider:  [name], MD  Encounter Date: 07/03/2014      PT End of Session - 07/03/14 1744    Visit Number 68   Number of Visits 79   PT Start Time 1430   PT Stop Time 1515   PT Time Calculation (min) 45 min      Past Medical History  Diagnosis Date  . Allergy   . Hypertension   . Hypothyroid   . Blood transfusion without reported diagnosis   . Pneumonia   . DVT (deep vein thrombosis) in pregnancy   . Breast cancer 07/12/13    invasive mammary carcinoma  . Anxiety   . Arthritis   . Radiation 11/21/13-01/07/14    Right Breast/supraclav.    Past Surgical History  Procedure Laterality Date  . Removal of teeth  04/19/2012    13 teeth removed  . Biospy      female organs  . Dilation and curettage of uterus    . Breast lumpectomy with radioactive seed localization Right 09/09/2013    Procedure: BREAST LUMPECTOMY WITH RADIOACTIVE SEED LOCALIZATION WITH AXILLARY NODE EXCISION;  Surgeon: [name], MD;  Location: [facility];  Service: General;  Laterality: Right;  . Re-excision of breast lumpectomy Right 09/24/2013    Procedure: RE-EXCISION OF RIGHT BREAST LUMPECTOMY;  Surgeon: [name], MD;  Location: [facility];  Service: General;  Laterality: Right;    There were no vitals taken for this visit.  Visit Diagnosis:  Physical deconditioning  Pain in joint, shoulder region, right      Subjective Assessment - 07/03/14 1437    Symptoms pt states she did exercise at home but she did not bring her log back in.    Currently in Pain? Yes   Pain Score 2    Pain Location Back   Pain Orientation Right   Pain Descriptors / Indicators Aching                     [facility] Adult PT Treatment/Exercise - 07/03/14 1740    Lumbar Exercises: Standing   Heel Raises 10 reps  2 sets   Functional Squats 10 reps  2 sets from high mat   Lumbar Exercises: Supine   Bridge 5 reps   Other Supine Lumbar Exercises lower trunk rotations   Knee/Hip Exercises: Stretches   Active Hamstring Stretch 2 reps   Knee/Hip Exercises: Standing   Forward Step Up 10 reps;Step Height: 4";Step Height: 6"  leading with right leg on 6" leading with left leg on 4"   Functional Squat 2 sets;10 reps  from high step    Other Standing Knee Exercises standing hip abduction 10  reps with each leg x 2 sets   Knee/Hip Exercises: Sidelying   Clams 10 reps on each side   Shoulder Exercises: Supine   Other Supine Exercises chest press with 2 # 2 sets of 10   Shoulder Exercises: Standing   Other Standing Exercises scaption x 10 reps with back against the wall   Other Standing Exercises tricip kickbacks  no wieght x 10 reps    Shoulder Exercises: ROM/Strengthening   "W" Arms 10 reps in standing   Shoulder Exercises: Stretch   Cross Chest Stretch 2 reps  Wall Stretch - Flexion 2 reps                PT Education - 07/03/14 1744    Education provided Yes   Education Details strength ABC exercise and log   Person(s) Educated Patient   Methods Explanation;Demonstration;Tactile cues;Verbal cues   Comprehension Need further instruction           Short Term Clinic Goals - 04/29/14 1533    CC Short Term Goal  #1   Title z             Long Term Clinic Goals - 06/26/14 1043    CC Long Term Goal  #1   Title independence in updated home exercise program   Status On-going   CC Long Term Goal  #2   Title tolerate nu step for 30 minutes to incrase exercise tolerance   Status On-going   CC Long Term Goal  #3   Title Quick Dash assessment will be less than 40    Status On-going   CC Long Term Goal  #4   Title verbalize a plan to continue  with community based exercise program   Status On-going            Plan - 07/03/14 1745    Clinical Impression Statement A lttle better today with less rest periods and less cues for exercise less c/o pain   Still needs continued repetition of instruction.   PT Next Visit Plan continue with same exercise routine and logging reinforcing biomechanical form with repetition.        Problem List Patient Active Problem List   Diagnosis Date Noted  . Vitamin D deficiency 04/23/2014  . Hyperparathyroidism 04/23/2014  . Depression 07/18/2013  . Breast cancer of upper-outer quadrant of right female breast 07/15/2013  . Encounter for therapeutic drug monitoring 07/05/2013  . Overactive bladder 01/30/2013  . DVT (deep venous thrombosis), left 12/19/2012  . HTN (hypertension) 09/27/2011  . Hearing loss 09/27/2011  . Chest wall pain 09/27/2011  . Seasonal allergies 09/27/2011  . SOB (shortness of breath), related to deconditioning 08/26/2011  . Hypothyroid 08/26/2011   [name]. Owens Shark, PT   07/03/2014, [place], [name], [address] Phone: [phone]   Fax:  [phone]

## 2014-07-03 NOTE — Telephone Encounter (Signed)
Pt LM on lab VM inquiring about PTH done on 1/9. Please review. Thanks

## 2014-07-04 NOTE — Telephone Encounter (Signed)
Left message for pt to call back  °

## 2014-07-07 NOTE — Telephone Encounter (Signed)
Left message for pt to call back. I have been unable to reach this patient by phone.  A letter is being sent.

## 2014-07-07 NOTE — Telephone Encounter (Signed)
Patient is calling back  °

## 2014-07-08 ENCOUNTER — Ambulatory Visit: Payer: Medicare Other | Admitting: Physical Therapy

## 2014-07-08 DIAGNOSIS — Z9889 Other specified postprocedural states: Secondary | ICD-10-CM | POA: Diagnosis not present

## 2014-07-08 DIAGNOSIS — M24511 Contracture, right shoulder: Secondary | ICD-10-CM | POA: Diagnosis not present

## 2014-07-08 DIAGNOSIS — I890 Lymphedema, not elsewhere classified: Secondary | ICD-10-CM | POA: Diagnosis not present

## 2014-07-08 DIAGNOSIS — R5381 Other malaise: Secondary | ICD-10-CM

## 2014-07-08 DIAGNOSIS — M25511 Pain in right shoulder: Secondary | ICD-10-CM

## 2014-07-08 DIAGNOSIS — M549 Dorsalgia, unspecified: Secondary | ICD-10-CM | POA: Diagnosis not present

## 2014-07-08 DIAGNOSIS — M542 Cervicalgia: Secondary | ICD-10-CM | POA: Diagnosis not present

## 2014-07-08 DIAGNOSIS — C50911 Malignant neoplasm of unspecified site of right female breast: Secondary | ICD-10-CM | POA: Diagnosis not present

## 2014-07-08 NOTE — Therapy (Signed)
[place], [name], [address] Phone: [phone]   Fax:  [phone]  Physical Therapy Treatment  Patient Details  Name: Alicia Vasquez MRN: [id number] Date of Birth: [birth year] Referring Provider:  [name], MD  Encounter Date: 07/08/2014      PT End of Session - 07/08/14 1621    PT Start Time 1450   PT Stop Time 1550   PT Time Calculation (min) 60 min   Activity Tolerance Patient tolerated treatment well   Behavior During Therapy [facility] for tasks assessed/performed      Past Medical History  Diagnosis Date  . Allergy   . Hypertension   . Hypothyroid   . Blood transfusion without reported diagnosis   . Pneumonia   . DVT (deep vein thrombosis) in pregnancy   . Breast cancer 07/12/13    invasive mammary carcinoma  . Anxiety   . Arthritis   . Radiation 11/21/13-01/07/14    Right Breast/supraclav.    Past Surgical History  Procedure Laterality Date  . Removal of teeth  04/19/2012    13 teeth removed  . Biospy      female organs  . Dilation and curettage of uterus    . Breast lumpectomy with radioactive seed localization Right 09/09/2013    Procedure: BREAST LUMPECTOMY WITH RADIOACTIVE SEED LOCALIZATION WITH AXILLARY NODE EXCISION;  Surgeon: [name], MD;  Location: [facility];  Service: General;  Laterality: Right;  . Re-excision of breast lumpectomy Right 09/24/2013    Procedure: RE-EXCISION OF RIGHT BREAST LUMPECTOMY;  Surgeon: [name], MD;  Location: [facility];  Service: General;  Laterality: Right;    There were no vitals taken for this visit.  Visit Diagnosis:  Physical deconditioning  Pain in joint, shoulder region, right      Subjective Assessment - 07/08/14 1452    Symptoms Pt comes to PT 20 minutes late She says she has been doing her exercises at home and has been logging it, but does not bring it with her.  She continues  to have pain in her right back that is only helped with tylenol, She says she is still short of breath and cannot figure out why she is still hoarse.  She plans to get new glasses on Thursday and get her drivers license renewed next [facility] Adult PT Treatment/Exercise - 07/08/14 1506    Lumbar Exercises: Standing   Heel Raises 10 reps  2 sets   Functional Squats 10 reps   Other Standing Lumbar Exercises standing hip extension leaning on armrests of chair   Lumbar Exercises: Supine   Bridge --  8 reps   Other Supine Lumbar Exercises lower trunk rotations   Knee/Hip Exercises: Stretches   Active Hamstring Stretch 2 reps   Gastroc Stretch 2 reps   Knee/Hip Exercises: Aerobic   Stationary Bike nustep level 3 for 30 minutes with arms only   Knee/Hip Exercises: Standing   Forward Step Up --   Functional Squat --   Other Standing Knee Exercises standing hip abduction 10  reps with each leg x 2 sets   Knee/Hip Exercises: Sidelying   Clams 10 reps on each side   Shoulder Exercises: Supine   Other Supine Exercises chest press with 2 # 2 sets of 10   Shoulder Exercises: Standing   Other Standing Exercises scaption x 10 reps with back against the wall  Other Standing Exercises bicep curls x 10 reps   Shoulder Exercises: ROM/Strengthening   "W" Arms 10 reps in standing   Shoulder Exercises: Stretch   Cross Chest Stretch 2 reps   Wall Stretch - Flexion 2 reps      While she was exercising on the nustep, I had a long discussion with patient with encouragement for her to seek out continued exercise opportunities in the community. We pulled up websites for senior centers and silver sneakers programs in the community that might be options for her to attend. She agreed to find out how much it would be to add the silver sneakers benefit to her insurance or how much it would cost to attend these programs without the benefit. She realizes it is time to transition to the  community exercise program          Short Term Clinic Goals - 04/29/14 1533    CC Short Term Goal  #1   Title z             Long Term Clinic Goals - 07/08/14 1648    CC Long Term Goal  #1   Title independence in updated home exercise program   Status On-going   CC Long Term Goal  #2   Title tolerate nu step for 30 minutes to incrase exercise tolerance   Status Achieved   CC Long Term Goal  #3   Title Quick Dash assessment will be less than 40    Status On-going   CC Long Term Goal  #4   Title verbalize a plan to continue with community based exercise program   Status On-going      Problem List Patient Active Problem List   Diagnosis Date Noted  . Vitamin D deficiency 04/23/2014  . Hyperparathyroidism 04/23/2014  . Depression 07/18/2013  . Breast cancer of upper-outer quadrant of right female breast 07/15/2013  . Encounter for therapeutic drug monitoring 07/05/2013  . Overactive bladder 01/30/2013  . DVT (deep venous thrombosis), left 12/19/2012  . HTN (hypertension) 09/27/2011  . Hearing loss 09/27/2011  . Chest wall pain 09/27/2011  . Seasonal allergies 09/27/2011  . SOB (shortness of breath), related to deconditioning 08/26/2011  . Hypothyroid 08/26/2011   [name]. Owens Shark, PT  07/08/2014, 4:49 PM  [place], [name], [address] Phone: [phone]   Fax:  [phone]

## 2014-07-08 NOTE — Telephone Encounter (Signed)
Spoke with pt, advised message from Dr. Lauenstein. Pt understood. 

## 2014-07-09 ENCOUNTER — Ambulatory Visit (INDEPENDENT_AMBULATORY_CARE_PROVIDER_SITE_OTHER): Payer: Medicare Other | Admitting: General Practice

## 2014-07-09 DIAGNOSIS — I82402 Acute embolism and thrombosis of unspecified deep veins of left lower extremity: Secondary | ICD-10-CM

## 2014-07-09 DIAGNOSIS — Z5181 Encounter for therapeutic drug level monitoring: Secondary | ICD-10-CM

## 2014-07-09 LAB — POCT INR: INR: 2.9

## 2014-07-09 NOTE — Progress Notes (Signed)
Pre visit review using our clinic review tool, if applicable. No additional management support is needed unless otherwise documented below in the visit note. 

## 2014-07-09 NOTE — Progress Notes (Signed)
Agree with plan 

## 2014-07-10 ENCOUNTER — Ambulatory Visit (INDEPENDENT_AMBULATORY_CARE_PROVIDER_SITE_OTHER): Payer: Medicare Other | Admitting: Family Medicine

## 2014-07-10 ENCOUNTER — Encounter: Payer: Self-pay | Admitting: Family Medicine

## 2014-07-10 VITALS — BP 139/77 | HR 78 | Temp 98.0°F | Resp 16 | Ht 64.5 in | Wt 227.0 lb

## 2014-07-10 DIAGNOSIS — E213 Hyperparathyroidism, unspecified: Secondary | ICD-10-CM

## 2014-07-10 DIAGNOSIS — R490 Dysphonia: Secondary | ICD-10-CM

## 2014-07-10 LAB — COMPREHENSIVE METABOLIC PANEL
ALT: 21 U/L (ref 0–35)
AST: 15 U/L (ref 0–37)
Albumin: 4 g/dL (ref 3.5–5.2)
Alkaline Phosphatase: 79 U/L (ref 39–117)
BUN: 14 mg/dL (ref 6–23)
CO2: 26 mEq/L (ref 19–32)
Calcium: 10 mg/dL (ref 8.4–10.5)
Chloride: 105 mEq/L (ref 96–112)
Creat: 0.67 mg/dL (ref 0.50–1.10)
Glucose, Bld: 102 mg/dL — ABNORMAL HIGH (ref 70–99)
Potassium: 4.3 mEq/L (ref 3.5–5.3)
Sodium: 139 mEq/L (ref 135–145)
Total Bilirubin: 0.3 mg/dL (ref 0.2–1.2)
Total Protein: 6.6 g/dL (ref 6.0–8.3)

## 2014-07-10 NOTE — Progress Notes (Signed)
This chart was scribed for [name], MD by Edison Simon, ED Scribe.  Patient ID: Alicia Vasquez MRN: [id number], DOB: 1940/11/30, 74 y.o. Date of Encounter: 07/10/2014, 2:13 PM  Primary Physician: [name], MD  Chief Complaint:  Chief Complaint  Patient presents with  . Follow-up  . Thyroid Problem    parathyroid  . Urinary Frequency     HPI: 74 y.o. year old female with history below significant for hypothyroid presents for follow up for thyroid hormone check. She states she is feeling better. On 06/07/2014, her PTH level was 75 and her Ca level was 9.9. She states she is using 5000 units of Vitamin D per day for 3 weeks, and was instructed to follow up in May. She states she has continued swelling to her right neck and shoulder area. She states her voice is hoarse. She states she recently had her eyes checked, previous was in 2002, and she states she has a cataract to her left eye and another beginning in her right eye. She is getting new glasses. She states she has been getting physical therapy and has improved ambulation with her cane. She denies any urinary symptoms at this time.   Past Medical History  Diagnosis Date  . Allergy   . Hypertension   . Hypothyroid   . Blood transfusion without reported diagnosis   . Pneumonia   . DVT (deep vein thrombosis) in pregnancy   . Breast cancer 07/12/13    invasive mammary carcinoma  . Anxiety   . Arthritis   . Radiation 11/21/13-01/07/14    Right Breast/supraclav.     Home Meds: Prior to Admission medications   Medication Sig Start Date End Date Taking? Authorizing Provider  acetaminophen (TYLENOL) 500 MG tablet Take 500 mg by mouth every 6 (six) hours as needed for mild pain, fever or headache.   Yes Historical Provider, MD  anastrozole (ARIMIDEX) 1 MG tablet Take 1 tablet (1 mg total) by mouth daily. 01/30/14  Yes Chauncey Cruel, MD  Cholecalciferol (VITAMIN D3) 5000 UNITS TABS Take by mouth.   Yes Historical Provider,  MD  fluocinonide-emollient (LIDEX-E) 0.05 % cream Apply 1 application topically 2 (two) times daily. 03/17/14  Yes [name], MD  hyaluronate sodium (RADIAPLEXRX) GEL Apply 1 application topically 2 (two) times daily. Apply 2x day after rad tx and bedtime daily and on weekends on rt breast 11/26/13  Yes Thea Silversmith, MD  levothyroxine (SYNTHROID) 125 MCG tablet Take 125 mcg by mouth daily before breakfast. Dispense as written: Synthroid   Yes Historical Provider, MD  lisinopril (PRINIVIL,ZESTRIL) 10 MG tablet Take 1 tablet (10 mg total) by mouth daily. 12/26/13  Yes [name], MD  non-metallic deodorant Jethro Poling) MISC Apply 1 application topically daily as needed. 11/27/13  Yes Thea Silversmith, MD  oxyCODONE (OXY IR/ROXICODONE) 5 MG immediate release tablet Take 1 tablet (5 mg total) by mouth every 6 (six) hours as needed. 06/05/14  Yes [name], MD  warfarin (COUMADIN) 5 MG tablet TAKE AS DIRECTED BY ANTICOAGULATION CLINIC 04/15/14  Yes Mancel Bale, PA-C    Allergies:  Allergies  Allergen Reactions  . Anesthetics, Amide Other (See Comments)    ELEVATE BLOOD PRESSURE  . Benadryl [Diphenhydramine Hcl]     dizziness  . Carbocaine [Mepivacaine Hcl] Other (See Comments)    Elevated blood pressure  . Codeine Nausea Only and Other (See Comments)    dizziness  . Epinephrine Other (See Comments)    Elevated blood pressure  .  Sulfa Antibiotics Other (See Comments)    dizziness  . Vicodin [Hydrocodone-Acetaminophen] Nausea Only  . Penicillins Rash    History   Social History  . Marital Status: Widowed    Spouse Name: N/A  . Number of Children: 1  . Years of Education: N/A   Occupational History  . Not on file.   Social History Main Topics  . Smoking status: Never Smoker   . Smokeless tobacco: Not on file  . Alcohol Use: No  . Drug Use: No  . Sexual Activity: No   Other Topics Concern  . Not on file   Social History Narrative   Exercise yard work     Review  of Systems: Constitutional: negative for chills, fever, night sweats, weight changes, or fatigue  HEENT: negative for vision changes, hearing loss, congestion, rhinorrhea, ST, epistaxis, or sinus pressure, positive swelling to right neck and shoulder area, voice hoarse Cardiovascular: negative for chest pain or palpitations Respiratory: negative for hemoptysis, wheezing, shortness of breath, or cough Abdominal: negative for abdominal pain, nausea, vomiting, diarrhea, or constipation Dermatological: negative for rash Neurologic: negative for headache, dizziness, or syncope All other systems reviewed and are otherwise negative with the exception to those above and in the HPI.   Physical Exam: Blood pressure 139/77, pulse 78, temperature 98 F (36.7 C), resp. rate 16, height 5' 4.5" (1.638 m), weight 227 lb (102.967 kg), SpO2 96 %., Body mass index is 38.38 kg/(m^2). General: Well developed, well nourished, in no acute distress. Head: Normocephalic, atraumatic, eyes without discharge, sclera non-icteric, nares are without discharge. Bilateral auditory canals clear, TM's are without perforation, pearly grey and translucent with reflective cone of light bilaterally. Oral cavity moist, posterior pharynx without exudate, erythema, peritonsillar abscess, or post nasal drip.  Neck: Supple. No thyromegaly. Full ROM. No lymphadenopathy. Lungs: Clear bilaterally to auscultation without wheezes, rales, or rhonchi. Breathing is unlabored. Heart: RRR with S1 S2. No murmurs, rubs, or gallops appreciated. Abdomen: Soft, non-tender, non-distended with normoactive bowel sounds. No hepatomegaly. No rebound/guarding. No obvious abdominal masses. Msk:  Strength and tone normal for age. Extremities/Skin: Warm and dry. No clubbing or cyanosis. No edema. No rashes or suspicious lesions. Neuro: Alert and oriented X 3. Moves all extremities spontaneously. Gait is normal. CNII-XII grossly in tact. Psych:  Responds to  questions appropriately with a normal affect.   Labs: Results for orders placed or performed in visit on 07/09/14  POCT INR  Result Value Ref Range   INR 2.9       ASSESSMENT AND PLAN:  74 y.o. year old female with  This chart was scribed in my presence and reviewed by me personally.    ICD-9-CM ICD-10-CM   1. Hyperparathyroidism 252.00 E21.3 PTH, Intact and Calcium     Comprehensive metabolic panel  2. Hoarseness 784.42 R49.0    Signed, [name], MD 07/10/2014 2:34 PM

## 2014-07-11 ENCOUNTER — Encounter: Payer: Self-pay | Admitting: Family Medicine

## 2014-07-11 LAB — PTH, INTACT AND CALCIUM
Calcium: 10 mg/dL (ref 8.4–10.5)
PTH: 76 pg/mL — ABNORMAL HIGH (ref 14–64)

## 2014-07-15 ENCOUNTER — Ambulatory Visit: Payer: Medicare Other | Admitting: Physical Therapy

## 2014-07-15 DIAGNOSIS — M24511 Contracture, right shoulder: Secondary | ICD-10-CM | POA: Diagnosis not present

## 2014-07-15 DIAGNOSIS — I890 Lymphedema, not elsewhere classified: Secondary | ICD-10-CM | POA: Diagnosis not present

## 2014-07-15 DIAGNOSIS — R5381 Other malaise: Secondary | ICD-10-CM

## 2014-07-15 DIAGNOSIS — C50911 Malignant neoplasm of unspecified site of right female breast: Secondary | ICD-10-CM | POA: Diagnosis not present

## 2014-07-15 DIAGNOSIS — Z9889 Other specified postprocedural states: Secondary | ICD-10-CM | POA: Diagnosis not present

## 2014-07-15 DIAGNOSIS — M25511 Pain in right shoulder: Secondary | ICD-10-CM

## 2014-07-15 DIAGNOSIS — M549 Dorsalgia, unspecified: Secondary | ICD-10-CM | POA: Diagnosis not present

## 2014-07-15 DIAGNOSIS — M542 Cervicalgia: Secondary | ICD-10-CM | POA: Diagnosis not present

## 2014-07-15 NOTE — Patient Instructions (Signed)
Pt encouraged to continue with exercise at home and to investigate community exercise programs.

## 2014-07-15 NOTE — Therapy (Signed)
[place], [name], [address] Phone: [phone]   Fax:  [phone]  Physical Therapy Treatment  Patient Details  Name: Alicia Vasquez MRN: [id number] Date of Birth: September 05, 1940 Referring Provider:  [name], MD  Encounter Date: 07/15/2014      PT End of Session - 07/15/14 1452    Visit Number 40   Number of Visits 65   Date for PT Re-Evaluation 07/26/14   PT Start Time 2751   PT Stop Time 1452   PT Time Calculation (min) 47 min   Activity Tolerance Patient tolerated treatment well   Behavior During Therapy [facility] for tasks assessed/performed      Past Medical History  Diagnosis Date  . Allergy   . Hypertension   . Hypothyroid   . Blood transfusion without reported diagnosis   . Pneumonia   . DVT (deep vein thrombosis) in pregnancy   . Breast cancer 07/12/13    invasive mammary carcinoma  . Anxiety   . Arthritis   . Radiation 11/21/13-01/07/14    Right Breast/supraclav.    Past Surgical History  Procedure Laterality Date  . Removal of teeth  04/19/2012    13 teeth removed  . Biospy      female organs  . Dilation and curettage of uterus    . Breast lumpectomy with radioactive seed localization Right 09/09/2013    Procedure: BREAST LUMPECTOMY WITH RADIOACTIVE SEED LOCALIZATION WITH AXILLARY NODE EXCISION;  Surgeon: [name], MD;  Location: [place];  Service: General;  Laterality: Right;  . Re-excision of breast lumpectomy Right 09/24/2013    Procedure: RE-EXCISION OF RIGHT BREAST LUMPECTOMY;  Surgeon: [name], MD;  Location: [place];  Service: General;  Laterality: Right;    There were no vitals taken for this visit.  Visit Diagnosis:  Physical deconditioning  Pain in joint, shoulder region, right      Subjective Assessment - 07/15/14 1406    Symptoms Pt comes in 15 minutes late due to  traffic,  She states that she got new  glasses and got her drivers license. She went to see Dr. Joseph Art.  Her calcium is back to normal, but she continues to have elevated parathyroid levels. She says she has not looked into community exercise program yet.    Currently in Pain? Yes   Pain Score 2    Pain Location Back   Pain Orientation Right;Lower   Pain Descriptors / Indicators Aching   Pain Type Chronic pain            [facility] Adult PT Treatment/Exercise - 07/15/14 1414    Lumbar Exercises: Standing   Heel Raises 10 reps  2 sets   Functional Squats 10 reps   Other Standing Lumbar Exercises standing hip extension leaning on armrests of chair   Lumbar Exercises: Supine   Bridge --  8 reps   Other Supine Lumbar Exercises lower trunk rotations   Knee/Hip Exercises: Stretches   Active Hamstring Stretch 2 reps   Gastroc Stretch 2 reps   Knee/Hip Exercises: Aerobic   Stationary Bike nustep level 3 for 15 minutes with arms only   Knee/Hip Exercises: Standing   Other Standing Knee Exercises standing hip abduction 10  reps with each leg    Knee/Hip Exercises: Sidelying   Clams 10 reps on each side   Shoulder Exercises: Supine   Other Supine Exercises chest press with 2 # 2 sets of 10  Shoulder Exercises: Standing   Other Standing Exercises scaption x 10 reps with back against the wall   Other Standing Exercises bicep curls x 10 reps with 2# x 2 sets   Shoulder Exercises: ROM/Strengthening   "W" Arms 10 reps in standing   Shoulder Exercises: Stretch   Cross Chest Stretch 2 reps   Wall Stretch - Flexion 2 reps            Short Term Clinic Goals - 04/29/14 1533    CC Short Term Goal  #1   Title z             Long Term Clinic Goals - 07/08/14 1648    CC Long Term Goal  #1   Title independence in updated home exercise program   Status On-going   CC Long Term Goal  #2   Title tolerate nu step for 30 minutes to incrase exercise tolerance   Status Achieved   CC Long Term Goal  #3   Title Quick Dash  assessment will be less than 40    Status On-going   CC Long Term Goal  #4   Title verbalize a plan to continue with community based exercise program   Status On-going            Plan - 07/15/14 1453    Clinical Impression Statement pt continues to need cues for exercise despite continues repetition.  Anticipate she will continue to need structured exercise program at d/c as she does not seem to be able to follow through at home. Pt encouraged to try to do home exercise and identify ways that I can help her with transition    PT Next Visit Plan Assess if she has narrowed down options for her community exercise program. Continue with strengh ABC and nustep with logging.    PT Plan Continue with exercise program and encourage patient intiatation to facilitate carry over at home.        Problem List Patient Active Problem List   Diagnosis Date Noted  . Vitamin D deficiency 04/23/2014  . Hyperparathyroidism 04/23/2014  . Depression 07/18/2013  . Breast cancer of upper-outer quadrant of right female breast 07/15/2013  . Encounter for therapeutic drug monitoring 07/05/2013  . Overactive bladder 01/30/2013  . DVT (deep venous thrombosis), left 12/19/2012  . HTN (hypertension) 09/27/2011  . Hearing loss 09/27/2011  . Chest wall pain 09/27/2011  . Seasonal allergies 09/27/2011  . SOB (shortness of breath), related to deconditioning 08/26/2011  . Hypothyroid 08/26/2011   [name]. Owens Shark, PT 07/15/2014, 3:01 PM  [place], [name], [address] Phone: [phone]   Fax:  [phone]

## 2014-07-16 ENCOUNTER — Telehealth: Payer: Self-pay | Admitting: Oncology

## 2014-07-16 NOTE — Telephone Encounter (Signed)
pt cld to state she recvd a message & talked w/someone in re to her appt being r/s-stated she wanted to see GM-adv he will not be in office-pt stated her ins will not pay for a NP-adv HF see pt w/Medicare all the time -she wanted to know if this person was quailied-adv she was and GM req pt to be senn by HF and assured pt that she is very qualified and our patient really like and respect HF-pt stated she was told untruth that it was being r/s w/ GM adv per note she will see HF an 3/2-pt understood

## 2014-07-17 ENCOUNTER — Ambulatory Visit: Payer: Medicare Other | Admitting: Physical Therapy

## 2014-07-17 DIAGNOSIS — I890 Lymphedema, not elsewhere classified: Secondary | ICD-10-CM | POA: Diagnosis not present

## 2014-07-17 DIAGNOSIS — M24511 Contracture, right shoulder: Secondary | ICD-10-CM | POA: Diagnosis not present

## 2014-07-17 DIAGNOSIS — R5381 Other malaise: Secondary | ICD-10-CM

## 2014-07-17 DIAGNOSIS — M549 Dorsalgia, unspecified: Secondary | ICD-10-CM | POA: Diagnosis not present

## 2014-07-17 DIAGNOSIS — C50911 Malignant neoplasm of unspecified site of right female breast: Secondary | ICD-10-CM | POA: Diagnosis not present

## 2014-07-17 DIAGNOSIS — M25511 Pain in right shoulder: Secondary | ICD-10-CM

## 2014-07-17 DIAGNOSIS — M542 Cervicalgia: Secondary | ICD-10-CM | POA: Diagnosis not present

## 2014-07-17 DIAGNOSIS — Z9889 Other specified postprocedural states: Secondary | ICD-10-CM | POA: Diagnosis not present

## 2014-07-17 NOTE — Therapy (Signed)
[place], [name], [address] Phone: [phone]   Fax:  [phone]  Physical Therapy Treatment  Patient Details  Name: Alicia Vasquez MRN: [id number] Date of Birth: 1941/05/22 Referring Provider:  [name], MD  Encounter Date: 07/17/2014      PT End of Session - 07/17/14 1716    Visit Number 52   Number of Visits 33   Date for PT Re-Evaluation 07/26/14   PT Start Time 1528   PT Stop Time 1600   PT Time Calculation (min) 32 min      Past Medical History  Diagnosis Date  . Allergy   . Hypertension   . Hypothyroid   . Blood transfusion without reported diagnosis   . Pneumonia   . DVT (deep vein thrombosis) in pregnancy   . Breast cancer 07/12/13    invasive mammary carcinoma  . Anxiety   . Arthritis   . Radiation 11/21/13-01/07/14    Right Breast/supraclav.    Past Surgical History  Procedure Laterality Date  . Removal of teeth  04/19/2012    13 teeth removed  . Biospy      female organs  . Dilation and curettage of uterus    . Breast lumpectomy with radioactive seed localization Right 09/09/2013    Procedure: BREAST LUMPECTOMY WITH RADIOACTIVE SEED LOCALIZATION WITH AXILLARY NODE EXCISION;  Surgeon: [name], MD;  Location: [facility];  Service: General;  Laterality: Right;  . Re-excision of breast lumpectomy Right 09/24/2013    Procedure: RE-EXCISION OF RIGHT BREAST LUMPECTOMY;  Surgeon: [name], MD;  Location: [facility];  Service: General;  Laterality: Right;    There were no vitals taken for this visit.  Visit Diagnosis:  Physical deconditioning  Pain in joint, shoulder region, right      Subjective Assessment - 07/17/14 1533    Symptoms pt comes in  11 minutes late.     Currently in Pain? Yes   Pain Score 2    Pain Location Back   Pain Onset More than a month ago                    [facility] Adult PT  Treatment/Exercise - 07/17/14 1536    Lumbar Exercises: Standing   Heel Raises 10 reps  2 sets   Functional Squats 10 reps   Other Standing Lumbar Exercises standing hip extension leaning on armrests of chair   Lumbar Exercises: Supine   Bridge --  8 reps   Other Supine Lumbar Exercises lower trunk rotations   Knee/Hip Exercises: Stretches   Active Hamstring Stretch 2 reps   Gastroc Stretch 2 reps   Knee/Hip Exercises: Aerobic   Stationary Bike nustep level 3 for 15 minutes with arms only   Knee/Hip Exercises: Standing   Other Standing Knee Exercises standing hip abduction 10  reps with each leg    Other Standing Knee Exercises standing hip extension leaning on chair 2 sets of 10   Knee/Hip Exercises: Sidelying   Clams 10 reps on each side   Shoulder Exercises: Supine   Other Supine Exercises chest press with 3 # 2 sets of 10   Shoulder Exercises: Standing   Other Standing Exercises scaption x 10 reps with back against the wall   Other Standing Exercises bicep curls x 10 reps with 3# x 2 sets   Shoulder Exercises: ROM/Strengthening   "W" Arms 10 reps in standing   Shoulder Exercises:  Stretch   Cross Chest Stretch 2 reps   Wall Stretch - Flexion 2 reps                   Short Term Clinic Goals - 04/29/14 1533    CC Short Term Goal  #1   Title z             Long Term Clinic Goals - 07/08/14 1648    CC Long Term Goal  #1   Title independence in updated home exercise program   Status On-going   CC Long Term Goal  #2   Title tolerate nu step for 30 minutes to incrase exercise tolerance   Status Achieved   CC Long Term Goal  #3   Title Quick Dash assessment will be less than 40    Status On-going   CC Long Term Goal  #4   Title verbalize a plan to continue with community based exercise program   Status On-going            Plan - 07/17/14 1717    Clinical Impression Statement Pt arrives late for treatment but walked in with a good speed.  she was  able perform exerice with less cueing today and incrase weights to 3#.  I gave her information about inexpensive exercise program and [employer] for her to consider.    PT Next Visit Plan Assess if she has narrowed down options for her community exercise program. Continue with strengh ABC and nustep with logging.         Problem List Patient Active Problem List   Diagnosis Date Noted  . Vitamin D deficiency 04/23/2014  . Hyperparathyroidism 04/23/2014  . Depression 07/18/2013  . Breast cancer of upper-outer quadrant of right female breast 07/15/2013  . Encounter for therapeutic drug monitoring 07/05/2013  . Overactive bladder 01/30/2013  . DVT (deep venous thrombosis), left 12/19/2012  . HTN (hypertension) 09/27/2011  . Hearing loss 09/27/2011  . Chest wall pain 09/27/2011  . Seasonal allergies 09/27/2011  . SOB (shortness of breath), related to deconditioning 08/26/2011  . Hypothyroid 08/26/2011   [name]. Owens Shark, PT   07/17/2014, 5:20 PM  [place], [name], [address] Phone: [phone]   Fax:  [phone]

## 2014-07-22 ENCOUNTER — Ambulatory Visit: Payer: Medicare Other | Admitting: Physical Therapy

## 2014-07-22 DIAGNOSIS — C50911 Malignant neoplasm of unspecified site of right female breast: Secondary | ICD-10-CM | POA: Diagnosis not present

## 2014-07-22 DIAGNOSIS — Z9889 Other specified postprocedural states: Secondary | ICD-10-CM | POA: Diagnosis not present

## 2014-07-22 DIAGNOSIS — M25511 Pain in right shoulder: Secondary | ICD-10-CM

## 2014-07-22 DIAGNOSIS — R5381 Other malaise: Secondary | ICD-10-CM

## 2014-07-22 DIAGNOSIS — I890 Lymphedema, not elsewhere classified: Secondary | ICD-10-CM | POA: Diagnosis not present

## 2014-07-22 DIAGNOSIS — M549 Dorsalgia, unspecified: Secondary | ICD-10-CM | POA: Diagnosis not present

## 2014-07-22 DIAGNOSIS — M24511 Contracture, right shoulder: Secondary | ICD-10-CM | POA: Diagnosis not present

## 2014-07-22 DIAGNOSIS — M542 Cervicalgia: Secondary | ICD-10-CM | POA: Diagnosis not present

## 2014-07-22 NOTE — Therapy (Signed)
[place], [name], [address] Phone: [phone]   Fax:  [phone]  Physical Therapy Treatment  Patient Details  Name: Alicia Vasquez MRN: [id number] Date of Birth: 1941-04-30 Referring Provider:  [name], MD  Encounter Date: 07/22/2014      PT End of Session - 07/22/14 1755    Visit Number 64   Number of Visits 65   Date for PT Re-Evaluation 07/26/14   PT Start Time 1345   PT Stop Time 1430   PT Time Calculation (min) 45 min   Behavior During Therapy [facility] for tasks assessed/performed      Past Medical History  Diagnosis Date  . Allergy   . Hypertension   . Hypothyroid   . Blood transfusion without reported diagnosis   . Pneumonia   . DVT (deep vein thrombosis) in pregnancy   . Breast cancer 07/12/13    invasive mammary carcinoma  . Anxiety   . Arthritis   . Radiation 11/21/13-01/07/14    Right Breast/supraclav.    Past Surgical History  Procedure Laterality Date  . Removal of teeth  04/19/2012    13 teeth removed  . Biospy      female organs  . Dilation and curettage of uterus    . Breast lumpectomy with radioactive seed localization Right 09/09/2013    Procedure: BREAST LUMPECTOMY WITH RADIOACTIVE SEED LOCALIZATION WITH AXILLARY NODE EXCISION;  Surgeon: [name], MD;  Location: [facility];  Service: General;  Laterality: Right;  . Re-excision of breast lumpectomy Right 09/24/2013    Procedure: RE-EXCISION OF RIGHT BREAST LUMPECTOMY;  Surgeon: [name], MD;  Location: [place];  Service: General;  Laterality: Right;    There were no vitals taken for this visit.  Visit Diagnosis:  Physical deconditioning  Pain in joint, shoulder region, right      Subjective Assessment - 07/22/14 1354    Symptoms pt states she did too much on Sunday....too much standing at church function.                 [name] - 07/22/14  0001    Open a tight or new jar No difficulty   Do heavy household chores (wash walls, wash floors) Moderate difficulty   Carry a shopping bag or briefcase No difficulty   Wash your back Mild difficulty   Use a knife to cut food No difficulty   Recreational activities in which you take some force or impact through your arm, shoulder, or hand (golf, hammering, tennis) Mild difficulty   During the past week, to what extent has your arm, shoulder or hand problem interfered with your normal social activities with family, friends, neighbors, or groups? Not at all   During the past week, to what extent has your arm, shoulder or hand problem limited your work or other regular daily activities Slightly   Arm, shoulder, or hand pain. Moderate   Tingling (pins and needles) in your arm, shoulder, or hand Moderate   Difficulty Sleeping Mild difficulty   DASH Score 22.73 %            [facility] Adult PT Treatment/Exercise - 07/22/14 0001    Lumbar Exercises: Standing   Heel Raises 10 reps  2 sets   Functional Squats 10 reps   Other Standing Lumbar Exercises standing hip extension leaning on armrests of chair   Lumbar Exercises: Supine   Bridge --  8 reps   Other  Supine Lumbar Exercises lower trunk rotations   Knee/Hip Exercises: Stretches   Active Hamstring Stretch 2 reps   Gastroc Stretch 2 reps   Knee/Hip Exercises: Aerobic   Stationary Bike nustep level 3 for 15 minutes with arms only   Knee/Hip Exercises: Standing   Other Standing Knee Exercises standing hip abduction 10  reps with each leg    Other Standing Knee Exercises standing hip extension leaning on chair 2 sets of 10   Knee/Hip Exercises: Sidelying   Clams 10 reps on each side   Shoulder Exercises: Supine   Other Supine Exercises chest press with 3 # 2 sets of 10   Shoulder Exercises: Standing   Other Standing Exercises scaption x 10 reps with back against the wall   Other Standing Exercises bicep curls x 10 reps with 3# x 2 sets    Shoulder Exercises: ROM/Strengthening   "W" Arms 10 reps in standing   Shoulder Exercises: Stretch   Cross Chest Stretch 2 reps   Wall Stretch - Flexion 2 reps      Discussed community exercise options: [employer] at [facility] gave pt brochure with phone number highlighted to call to register Vs: PREP program (gave pt postcard with infor) vs. Regular Y membership at [employer] or [facility] Goals - 04/29/14 1533    CC Short Term Goal  #1   Title z             [facility] Goals - 07/22/14 1751    CC Long Term Goal  #1   Title independence in updated home exercise program   Time 4   Period Weeks   Status On-going   CC Long Term Goal  #2   Title tolerate nu step for 30 minutes to incrase exercise tolerance   Status Achieved   CC Long Term Goal  #3   Title Quick Dash assessment will be less than 40   score 22.73 on 07/22/2014   Status Achieved   CC Long Term Goal  #4   Title verbalize a plan to continue with community based exercise program   Status On-going            Plan - 07/22/14 1756    Clinical Impression Statement pt early for appointment, Able to remember exrcises better with minimal cues. She is getting close to deciding on a communtiy progrm now, deciding [facility] [employer] [employer] and [employer] , PREP program or regular [employer] She is prepared that final day will be next session.   PT Next Visit Plan Assess if she has narrowed down options for her community exercise program. Continue with strengh ABC and nustep with logging.         Problem List Patient Active Problem List   Diagnosis Date Noted  . Vitamin D deficiency 04/23/2014  . Hyperparathyroidism 04/23/2014  . Depression 07/18/2013  . Breast cancer of upper-outer quadrant of right female breast 07/15/2013  . Encounter for therapeutic drug monitoring 07/05/2013  . Overactive bladder 01/30/2013  . DVT (deep venous thrombosis), left  12/19/2012  . HTN (hypertension) 09/27/2011  . Hearing loss 09/27/2011  . Chest wall pain 09/27/2011  . Seasonal allergies 09/27/2011  . SOB (shortness of breath), related to deconditioning 08/26/2011  . Hypothyroid 08/26/2011    [name] 07/22/2014, 5:59 PM  [place], [name], [address]  Phone: [phone]   Fax:  [phone]

## 2014-07-23 ENCOUNTER — Other Ambulatory Visit: Payer: Self-pay | Admitting: *Deleted

## 2014-07-23 ENCOUNTER — Ambulatory Visit: Payer: Medicare Other | Admitting: Physical Therapy

## 2014-07-23 DIAGNOSIS — M549 Dorsalgia, unspecified: Secondary | ICD-10-CM | POA: Diagnosis not present

## 2014-07-23 DIAGNOSIS — I890 Lymphedema, not elsewhere classified: Secondary | ICD-10-CM | POA: Diagnosis not present

## 2014-07-23 DIAGNOSIS — Z9889 Other specified postprocedural states: Secondary | ICD-10-CM | POA: Diagnosis not present

## 2014-07-23 DIAGNOSIS — M542 Cervicalgia: Secondary | ICD-10-CM | POA: Diagnosis not present

## 2014-07-23 DIAGNOSIS — M25511 Pain in right shoulder: Secondary | ICD-10-CM

## 2014-07-23 DIAGNOSIS — R5381 Other malaise: Secondary | ICD-10-CM

## 2014-07-23 DIAGNOSIS — C50911 Malignant neoplasm of unspecified site of right female breast: Secondary | ICD-10-CM | POA: Diagnosis not present

## 2014-07-23 DIAGNOSIS — M24511 Contracture, right shoulder: Secondary | ICD-10-CM | POA: Diagnosis not present

## 2014-07-23 DIAGNOSIS — C50411 Malignant neoplasm of upper-outer quadrant of right female breast: Secondary | ICD-10-CM

## 2014-07-23 NOTE — Therapy (Signed)
[place], [name], [address] Phone: [phone]   Fax:  [phone]  Physical Therapy Treatment  Patient Details  Name: Alicia Vasquez MRN: [id number] Date of Birth: [birth year] Referring Provider:  [name], MD  Encounter Date: 07/23/2014      PT End of Session - 07/23/14 1737    Visit Number 49   Number of Visits 65   Date for PT Re-Evaluation 08/08/14   PT Start Time 4174   PT Stop Time 1600   PT Time Calculation (min) 30 min   Activity Tolerance Patient tolerated treatment well   Behavior During Therapy [facility] for tasks assessed/performed      Past Medical History  Diagnosis Date  . Allergy   . Hypertension   . Hypothyroid   . Blood transfusion without reported diagnosis   . Pneumonia   . DVT (deep vein thrombosis) in pregnancy   . Breast cancer 07/12/13    invasive mammary carcinoma  . Anxiety   . Arthritis   . Radiation 11/21/13-01/07/14    Right Breast/supraclav.    Past Surgical History  Procedure Laterality Date  . Removal of teeth  04/19/2012    13 teeth removed  . Biospy      female organs  . Dilation and curettage of uterus    . Breast lumpectomy with radioactive seed localization Right 09/09/2013    Procedure: BREAST LUMPECTOMY WITH RADIOACTIVE SEED LOCALIZATION WITH AXILLARY NODE EXCISION;  Surgeon: [name], MD;  Location: [place];  Service: General;  Laterality: Right;  . Re-excision of breast lumpectomy Right 09/24/2013    Procedure: RE-EXCISION OF RIGHT BREAST LUMPECTOMY;  Surgeon: [name], MD;  Location: [place];  Service: General;  Laterality: Right;    There were no vitals taken for this visit.  Visit Diagnosis:  Physical deconditioning  Pain in joint, shoulder region, right      Subjective Assessment - 07/23/14 1729    Symptoms pt states she continues to have pain, but feels that she is walking  better,  She has decided she will join the [place] program at the [facility] that will be held on Tues and Thurs from 10-11:30.  The class starts on March 15 and runs until June 2.  She would like to continue PT here until then so that she stay strong enough to participate in the class and get used to the equipment    Currently in Pain? Yes   Pain Score 2    Pain Location Back   Pain Descriptors / Indicators Aching            [facility] Adult PT Treatment/Exercise - 07/23/14 0001    Knee/Hip Exercises: Aerobic   Stationary Bike nustep level 3 for 15 minutes with legs only   Knee/Hip Exercises: Machines for Strengthening   Cybex Leg Press   2 sets of 10 with  1 plate  on set of 10 with 2 plates   Knee/Hip Exercises: Standing   Lateral Step Up Both;10 reps;Step Height: 4";Hand Hold: 1   Shoulder Exercises: Seated   Other Seated Exercises universal gym seated pull down bar with one plate        Renewal request sent       Short Term Clinic Goals - 04/29/14 1533    CC Short Term Goal  #1   Title z             Long Term Clinic Goals -  07/23/14 1557    CC Long Term Goal  #1   Title independence in updated home exercise program   Status Achieved   CC Long Term Goal  #2   Title tolerate nu step for 30 minutes to incrase exercise tolerance   Status Achieved   CC Long Term Goal  #3   Title Quick Dash assessment will be less than 40   22.73 on 07/21/2014   Status Achieved   CC Long Term Goal  #4   Title verbalize a plan to continue with community based exercise program  pt to do [employer] [facility] 3//15- 6/2   Status Achieved   CC Long Term Goal  #5   Title pt will report that she is familiar with exercise equipment and ready to transition to [employer] progam    Time 2   Period Weeks   Status New            Plan - 07/23/14 1746    PT Next Visit Plan continue with stretching program and use exercise equipment for strengthening. GCode next        Problem  List Patient Active Problem List   Diagnosis Date Noted  . Vitamin D deficiency 04/23/2014  . Hyperparathyroidism 04/23/2014  . Depression 07/18/2013  . Breast cancer of upper-outer quadrant of right female breast 07/15/2013  . Encounter for therapeutic drug monitoring 07/05/2013  . Overactive bladder 01/30/2013  . DVT (deep venous thrombosis), left 12/19/2012  . HTN (hypertension) 09/27/2011  . Hearing loss 09/27/2011  . Chest wall pain 09/27/2011  . Seasonal allergies 09/27/2011  . SOB (shortness of breath), related to deconditioning 08/26/2011  . Hypothyroid 08/26/2011   [name]. Owens Shark, PT  07/23/2014, 5:50 PM  [place], [name], [address] Phone: [phone]   Fax:  [phone]

## 2014-07-24 ENCOUNTER — Other Ambulatory Visit (HOSPITAL_BASED_OUTPATIENT_CLINIC_OR_DEPARTMENT_OTHER): Payer: Medicare Other

## 2014-07-24 DIAGNOSIS — C50411 Malignant neoplasm of upper-outer quadrant of right female breast: Secondary | ICD-10-CM | POA: Diagnosis not present

## 2014-07-24 LAB — COMPREHENSIVE METABOLIC PANEL (CC13)
ALT: 26 U/L (ref 0–55)
AST: 19 U/L (ref 5–34)
Albumin: 3.8 g/dL (ref 3.5–5.0)
Alkaline Phosphatase: 81 U/L (ref 40–150)
Anion Gap: 8 mEq/L (ref 3–11)
BILIRUBIN TOTAL: 0.41 mg/dL (ref 0.20–1.20)
BUN: 14.8 mg/dL (ref 7.0–26.0)
CO2: 26 mEq/L (ref 22–29)
Calcium: 10.2 mg/dL (ref 8.4–10.4)
Chloride: 106 mEq/L (ref 98–109)
Creatinine: 0.8 mg/dL (ref 0.6–1.1)
EGFR: 77 mL/min/{1.73_m2} — ABNORMAL LOW (ref >90)
Glucose: 100 mg/dl (ref 70–140)
Potassium: 4.2 mEq/L (ref 3.5–5.1)
Sodium: 140 mEq/L (ref 136–145)
Total Protein: 6.7 g/dL (ref 6.4–8.3)

## 2014-07-24 LAB — CBC WITH DIFFERENTIAL/PLATELET
BASO%: 1 % (ref 0.0–2.0)
BASOS ABS: 0.1 10*3/uL (ref 0.0–0.1)
EOS ABS: 0.3 10*3/uL (ref 0.0–0.5)
EOS%: 4.2 % (ref 0.0–7.0)
HCT: 43.4 % (ref 34.8–46.6)
HGB: 14.2 g/dL (ref 11.6–15.9)
LYMPH%: 22 % (ref 14.0–49.7)
MCH: 30.9 pg (ref 25.1–34.0)
MCHC: 32.7 g/dL (ref 31.5–36.0)
MCV: 94.6 fL (ref 79.5–101.0)
MONO#: 1 10*3/uL — ABNORMAL HIGH (ref 0.1–0.9)
MONO%: 16.9 % — ABNORMAL HIGH (ref 0.0–14.0)
NEUT#: 3.3 10*3/uL (ref 1.5–6.5)
NEUT%: 55.9 % (ref 38.4–76.8)
Platelets: 220 10*3/uL (ref 145–400)
RBC: 4.59 10*6/uL (ref 3.70–5.45)
RDW: 14.2 % (ref 11.2–14.5)
WBC: 5.9 10*3/uL (ref 3.9–10.3)
lymph#: 1.3 10*3/uL (ref 0.9–3.3)

## 2014-07-27 ENCOUNTER — Ambulatory Visit (INDEPENDENT_AMBULATORY_CARE_PROVIDER_SITE_OTHER): Payer: Medicare Other | Admitting: Family Medicine

## 2014-07-27 VITALS — BP 130/80 | HR 91 | Temp 98.3°F | Resp 16 | Ht 64.0 in | Wt 230.4 lb

## 2014-07-27 DIAGNOSIS — J209 Acute bronchitis, unspecified: Secondary | ICD-10-CM

## 2014-07-27 DIAGNOSIS — E213 Hyperparathyroidism, unspecified: Secondary | ICD-10-CM | POA: Diagnosis not present

## 2014-07-27 LAB — POCT CBC
Granulocyte percent: 76.2 %G (ref 37–80)
HCT, POC: 44.5 % (ref 37.7–47.9)
Hemoglobin: 14.6 g/dL (ref 12.2–16.2)
Lymph, poc: 2.3 (ref 0.6–3.4)
MCH, POC: 30.8 pg (ref 27–31.2)
MCHC: 33 g/dL (ref 31.8–35.4)
MCV: 93.5 fL (ref 80–97)
MID (cbc): 0.5 (ref 0–0.9)
MPV: 7.6 fL (ref 0–99.8)
POC Granulocyte: 8.8 — AB (ref 2–6.9)
POC LYMPH PERCENT: 19.5 %L (ref 10–50)
POC MID %: 4.3 %M (ref 0–12)
Platelet Count, POC: 235 10*3/uL (ref 142–424)
RBC: 4.75 M/uL (ref 4.04–5.48)
RDW, POC: 15 %
WBC: 11.6 10*3/uL — AB (ref 4.6–10.2)

## 2014-07-27 LAB — COMPREHENSIVE METABOLIC PANEL
ALT: 23 U/L (ref 0–35)
AST: 17 U/L (ref 0–37)
Albumin: 4.3 g/dL (ref 3.5–5.2)
Alkaline Phosphatase: 79 U/L (ref 39–117)
BUN: 12 mg/dL (ref 6–23)
CO2: 26 mEq/L (ref 19–32)
Calcium: 10.2 mg/dL (ref 8.4–10.5)
Chloride: 102 mEq/L (ref 96–112)
Creat: 0.75 mg/dL (ref 0.50–1.10)
Glucose, Bld: 100 mg/dL — ABNORMAL HIGH (ref 70–99)
Potassium: 4.1 mEq/L (ref 3.5–5.3)
Sodium: 137 mEq/L (ref 135–145)
Total Bilirubin: 0.5 mg/dL (ref 0.2–1.2)
Total Protein: 7.2 g/dL (ref 6.0–8.3)

## 2014-07-27 MED ORDER — CHLORHEXIDINE GLUCONATE 0.12 % MT SOLN: 15.0000 mL | Freq: Two times a day (BID) | OROMUCOSAL | Status: DC

## 2014-07-27 MED ORDER — HYDROCODONE-HOMATROPINE 5-1.5 MG/5ML PO SYRP: 5.0000 mL | ORAL_SOLUTION | Freq: Three times a day (TID) | ORAL | Status: DC | PRN

## 2014-07-27 MED ORDER — AZITHROMYCIN 250 MG PO TABS: ORAL_TABLET | ORAL | Status: DC

## 2014-07-27 NOTE — Progress Notes (Signed)
Subjective:    Patient ID: Alicia Vasquez, female    DOB: 29-Jan-1941, 74 y.o.   MRN: [id number]  HPI Chief Complaint  Patient presents with   Sore Throat   Cough   Ear Pain    Left   Shortness of Breath   Watery Eyes   This chart was scribed for [name], MD by Thea Alken, ED Scribe. This patient was seen in room 10 and the patient's care was started at 12:15 PM.  HPI Comments: Alicia Vasquez is a 74 y.o. female who presents to the [facility] complaining of burning sore throat that began last night. Pt reports associated postnasal drip, left otalgia, rhinorrhea, watery eye, and productive cough consisting of yellow phlegm. Pt states she had a hard time catching her breath last night due to both nasal congestion and chest congestion. Pt reports slight cataracts in left eye.   Past Medical History  Diagnosis Date   Allergy    Hypertension    Hypothyroid    Blood transfusion without reported diagnosis    Pneumonia    DVT (deep vein thrombosis) in pregnancy    Breast cancer 07/12/13    invasive mammary carcinoma   Anxiety    Arthritis    Radiation 11/21/13-01/07/14    Right Breast/supraclav.   Past Surgical History  Procedure Laterality Date   Removal of teeth  04/19/2012    13 teeth removed   Biospy      female organs   Dilation and curettage of uterus     Breast lumpectomy with radioactive seed localization Right 09/09/2013    Procedure: BREAST LUMPECTOMY WITH RADIOACTIVE SEED LOCALIZATION WITH AXILLARY NODE EXCISION;  Surgeon: [name], MD;  Location: [place];  Service: General;  Laterality: Right;   Re-excision of breast lumpectomy Right 09/24/2013    Procedure: RE-EXCISION OF RIGHT BREAST LUMPECTOMY;  Surgeon: [name], MD;  Location: [place];  Service: General;  Laterality: Right;   Prior to Admission medications   Medication Sig Start Date End Date Taking? Authorizing  Provider  acetaminophen (TYLENOL) 500 MG tablet Take 500 mg by mouth every 6 (six) hours as needed for mild pain, fever or headache.   Yes Historical Provider, MD  Cholecalciferol (VITAMIN D3) 5000 UNITS TABS Take by mouth.   Yes Historical Provider, MD  hyaluronate sodium (RADIAPLEXRX) GEL Apply 1 application topically 2 (two) times daily. Apply 2x day after rad tx and bedtime daily and on weekends on rt breast 11/26/13  Yes Thea Silversmith, MD  levothyroxine (SYNTHROID) 125 MCG tablet Take 125 mcg by mouth daily before breakfast. Dispense as written: Synthroid   Yes Historical Provider, MD  lisinopril (PRINIVIL,ZESTRIL) 10 MG tablet Take 1 tablet (10 mg total) by mouth daily. 12/26/13  Yes [name], MD  non-metallic deodorant Jethro Poling) MISC Apply 1 application topically daily as needed. 11/27/13  Yes Thea Silversmith, MD  oxyCODONE (OXY IR/ROXICODONE) 5 MG immediate release tablet Take 1 tablet (5 mg total) by mouth every 6 (six) hours as needed. 06/05/14  Yes [name], MD  warfarin (COUMADIN) 5 MG tablet TAKE AS DIRECTED BY ANTICOAGULATION CLINIC 04/15/14  Yes Mancel Bale, PA-C  anastrozole (ARIMIDEX) 1 MG tablet Take 1 tablet (1 mg total) by mouth daily. Patient not taking: Reported on 07/27/2014 01/30/14   Chauncey Cruel, MD   Review of Systems  HENT: Positive for ear pain, postnasal drip, rhinorrhea and sore throat.   Eyes: Positive  for discharge ( watery).  Respiratory: Positive for cough.        Objective:   Physical Exam  Constitutional: She is oriented to person, place, and time. She appears well-developed and well-nourished. No distress.  HENT:  Head: Normocephalic and atraumatic.  Mouth/Throat: Posterior oropharyngeal erythema present.  Eyes: Conjunctivae and EOM are normal.  Neck: Neck supple.  Cardiovascular: Normal rate, regular rhythm and normal heart sounds.  Exam reveals no gallop and no friction rub.   No murmur heard. Pulmonary/Chest: Effort normal. She has  rhonchi ( bilaterally).  Musculoskeletal: Normal range of motion.  Lymphadenopathy:    She has cervical adenopathy.  Swollen anterior cervical nodes.   Neurological: She is alert and oriented to person, place, and time.  Skin: Skin is warm and dry.  Psychiatric: She has a normal mood and affect. Her behavior is normal.  Nursing note and vitals reviewed.  Filed Vitals:   07/27/14 1158  BP: 130/80  Pulse: 91  Temp: 98.3 F (36.8 C)  Resp: 16       Assessment & Plan:   This chart was scribed in my presence and reviewed by me personally.    ICD-9-CM ICD-10-CM   1. Acute bronchitis, unspecified organism 466.0 J20.9 POCT CBC     chlorhexidine (PERIDEX) 0.12 % solution     azithromycin (ZITHROMAX Z-PAK) 250 MG tablet     HYDROcodone-homatropine (HYCODAN) 5-1.5 MG/5ML syrup  2. Hyperparathyroidism 252.00 E21.3 Comprehensive metabolic panel     PTH, Intact and Calcium     Signed, [name], MD

## 2014-07-28 ENCOUNTER — Encounter: Payer: Self-pay | Admitting: Radiology

## 2014-07-28 ENCOUNTER — Encounter: Payer: Medicare Other | Admitting: Physical Therapy

## 2014-07-28 ENCOUNTER — Ambulatory Visit: Payer: Medicare Other | Admitting: Physical Therapy

## 2014-07-28 LAB — PTH, INTACT AND CALCIUM
Calcium: 10.1 mg/dL (ref 8.4–10.5)
PTH: 88 pg/mL — ABNORMAL HIGH (ref 14–64)

## 2014-07-29 ENCOUNTER — Encounter: Payer: Medicare Other | Admitting: Physical Therapy

## 2014-07-30 ENCOUNTER — Ambulatory Visit: Payer: Medicare Other | Admitting: Nurse Practitioner

## 2014-07-30 ENCOUNTER — Telehealth: Payer: Self-pay | Admitting: Nurse Practitioner

## 2014-07-30 ENCOUNTER — Telehealth: Payer: Self-pay | Admitting: *Deleted

## 2014-07-30 NOTE — Telephone Encounter (Signed)
pt cld to CX appt-stated she was told by nurse she would call back to see when she wanted to r/s

## 2014-07-30 NOTE — Telephone Encounter (Signed)
Called to inquire as to why the pt didn't come to f/u appt today. Pt said she called yesterday to let the schedulers know she would not be able to come today b/c of acute bronchitis. I told pt we will r/s her at a later date, but give her a chance to get better. Pt will see her PCP next week for a f/u since being dx with bronchitis. I will call pt next week after this appt on Mar 10th. Pt said she is on an antibiotic and cough medicine. I encouraged pt to drink more fluids. Pt verbalized understanding. No further concerns. Message to be forwarded to [facility].

## 2014-07-31 ENCOUNTER — Ambulatory Visit: Payer: Medicare Other | Admitting: Physical Therapy

## 2014-07-31 ENCOUNTER — Ambulatory Visit: Payer: Medicare Other | Admitting: Oncology

## 2014-08-05 ENCOUNTER — Ambulatory Visit: Payer: Medicare Other | Attending: Oncology | Admitting: Physical Therapy

## 2014-08-05 ENCOUNTER — Encounter: Payer: Medicare Other | Admitting: Physical Therapy

## 2014-08-05 DIAGNOSIS — M542 Cervicalgia: Secondary | ICD-10-CM | POA: Insufficient documentation

## 2014-08-05 DIAGNOSIS — M24511 Contracture, right shoulder: Secondary | ICD-10-CM | POA: Insufficient documentation

## 2014-08-05 DIAGNOSIS — I890 Lymphedema, not elsewhere classified: Secondary | ICD-10-CM | POA: Insufficient documentation

## 2014-08-05 DIAGNOSIS — C50911 Malignant neoplasm of unspecified site of right female breast: Secondary | ICD-10-CM | POA: Insufficient documentation

## 2014-08-05 DIAGNOSIS — M549 Dorsalgia, unspecified: Secondary | ICD-10-CM | POA: Diagnosis not present

## 2014-08-05 DIAGNOSIS — Z9889 Other specified postprocedural states: Secondary | ICD-10-CM | POA: Diagnosis not present

## 2014-08-05 DIAGNOSIS — M25511 Pain in right shoulder: Secondary | ICD-10-CM

## 2014-08-05 DIAGNOSIS — R5381 Other malaise: Secondary | ICD-10-CM

## 2014-08-05 NOTE — Therapy (Signed)
[place], [name], [address] Phone: [phone]   Fax:  [phone]  Physical Therapy Treatment  Patient Details  Name: Alicia Vasquez MRN: [id number] Date of Birth: [birth year] Referring Provider:  [name], MD  Encounter Date: 08/05/2014      PT End of Session - 08/05/14 1644    Visit Number 60   Number of Visits 65   Date for PT Re-Evaluation 08/08/14   PT Start Time 1600   PT Stop Time 0174   PT Time Calculation (min) 41 min      Past Medical History  Diagnosis Date  . Allergy   . Hypertension   . Hypothyroid   . Blood transfusion without reported diagnosis   . Pneumonia   . DVT (deep vein thrombosis) in pregnancy   . Breast cancer 07/12/13    invasive mammary carcinoma  . Anxiety   . Arthritis   . Radiation 11/21/13-01/07/14    Right Breast/supraclav.    Past Surgical History  Procedure Laterality Date  . Removal of teeth  04/19/2012    13 teeth removed  . Biospy      female organs  . Dilation and curettage of uterus    . Breast lumpectomy with radioactive seed localization Right 09/09/2013    Procedure: BREAST LUMPECTOMY WITH RADIOACTIVE SEED LOCALIZATION WITH AXILLARY NODE EXCISION;  Surgeon: [name], MD;  Location: [facility];  Service: General;  Laterality: Right;  . Re-excision of breast lumpectomy Right 09/24/2013    Procedure: RE-EXCISION OF RIGHT BREAST LUMPECTOMY;  Surgeon: [name], MD;  Location: [facility];  Service: General;  Laterality: Right;    There were no vitals taken for this visit.  Visit Diagnosis:  Physical deconditioning  Pain in joint, shoulder region, right      Subjective Assessment - 08/05/14 1608    Symptoms pt was very sick with bronchitis and had to cancel all appointments. She had to cancel her appointment with [facility] program, but is going out to [place] to enroll in program this week.     Currently in Pain? Yes   Pain Score 4   after tylenol    Pain Location Back   Pain Orientation Right;Left   Pain Descriptors / Indicators Aching   Pain Type Chronic pain                    [facility] Adult PT Treatment/Exercise - 08/05/14 0001    Lumbar Exercises: Machines for Strengthening   Other Lumbar Machine Exercise horizontal leg press. 1 plate with 2 sets of 10   Lumbar Exercises: Standing   Heel Raises 10 reps  2 sets   Functional Squats 10 reps   Lumbar Exercises: Supine   Bridge 5 reps   Other Supine Lumbar Exercises lower trunk rotations   Knee/Hip Exercises: Aerobic   Stationary Bike nustep level 3 for 15 minutes with legs only   Knee/Hip Exercises: Supine   Straight Leg Raises 10 reps;Strengthening   Knee/Hip Exercises: Sidelying   Hip ABduction 5 reps   Clams 5 reps   Shoulder Exercises: Supine   Other Supine Exercises chest press with  2 sets of 10   Other Supine Exercises scapular retraction.    Shoulder Exercises: Seated   Other Seated Exercises scaption with no weight x 10 reps   Shoulder Exercises: ROM/Strengthening   Other ROM/Strengthening Exercises free motion machine for standing rows to waist    Ankle  Exercises: Seated   Heel Raises 10 reps                   Short Term Clinic Goals - 04/29/14 1533    CC Short Term Goal  #1   Title z             Long Term Clinic Goals - 08-29-2014 1647    CC Long Term Goal  #1   Title independence in updated home exercise program   Status Achieved   CC Long Term Goal  #2   Title tolerate nu step for 30 minutes to incrase exercise tolerance   Status Achieved   CC Long Term Goal  #3   Title Quick Dash assessment will be less than 40    Status Achieved   CC Long Term Goal  #4   Title verbalize a plan to continue with community based exercise program   Status Achieved   CC Long Term Goal  #5   Title pt will report that she is familiar with exercise equipment and ready to transition  to [employer] progam    Status On-going            Plan - 08-29-2014 1645    Clinical Impression Statement pt with some fatigue after bronchitis and c/o back pain when she arrived, but reports that back pain is improved after exercise. Continue to feel that she is ready to progress to [facility] [employer] program and provided encouragement for same   PT Next Visit Plan continue with stretching program and use exercise equipment for strengthening.           G-Codes - 29-Aug-2014 1647    Functional Assessment Tool Used clinical judgement   Functional Limitation Other PT primary   Other PT Primary Current Status (E1740) At least 20 percent but less than 40 percent impaired, limited or restricted   Other PT Primary Goal Status (C1448) At least 20 percent but less than 40 percent impaired, limited or restricted      Problem List Patient Active Problem List   Diagnosis Date Noted  . Vitamin D deficiency 04/23/2014  . Hyperparathyroidism 04/23/2014  . Depression 07/18/2013  . Breast cancer of upper-outer quadrant of right female breast 07/15/2013  . Encounter for therapeutic drug monitoring 07/05/2013  . Overactive bladder 01/30/2013  . DVT (deep venous thrombosis), left 12/19/2012  . HTN (hypertension) 09/27/2011  . Hearing loss 09/27/2011  . Chest wall pain 09/27/2011  . Seasonal allergies 09/27/2011  . SOB (shortness of breath), related to deconditioning 08/26/2011  . Hypothyroid 08/26/2011   [name]. Owens Shark, PT    08/29/14, 4:49 PM  [place], [name], [address] Phone: [phone]   Fax:  [phone]

## 2014-08-06 ENCOUNTER — Ambulatory Visit (INDEPENDENT_AMBULATORY_CARE_PROVIDER_SITE_OTHER): Payer: Medicare Other | Admitting: General Practice

## 2014-08-06 DIAGNOSIS — Z5181 Encounter for therapeutic drug level monitoring: Secondary | ICD-10-CM | POA: Diagnosis not present

## 2014-08-06 DIAGNOSIS — I82402 Acute embolism and thrombosis of unspecified deep veins of left lower extremity: Secondary | ICD-10-CM

## 2014-08-06 LAB — POCT INR: INR: 2.9

## 2014-08-06 NOTE — Progress Notes (Signed)
Pre visit review using our clinic review tool, if applicable. No additional management support is needed unless otherwise documented below in the visit note. 

## 2014-08-06 NOTE — Progress Notes (Signed)
Agree with plan 

## 2014-08-07 ENCOUNTER — Encounter: Payer: Self-pay | Admitting: Family Medicine

## 2014-08-07 ENCOUNTER — Ambulatory Visit (INDEPENDENT_AMBULATORY_CARE_PROVIDER_SITE_OTHER): Payer: Medicare Other | Admitting: Family Medicine

## 2014-08-07 VITALS — BP 138/70 | HR 77 | Temp 97.4°F | Resp 16 | Ht 64.0 in | Wt 227.6 lb

## 2014-08-07 DIAGNOSIS — R059 Cough, unspecified: Secondary | ICD-10-CM

## 2014-08-07 DIAGNOSIS — J209 Acute bronchitis, unspecified: Secondary | ICD-10-CM | POA: Diagnosis not present

## 2014-08-07 DIAGNOSIS — R0789 Other chest pain: Secondary | ICD-10-CM | POA: Diagnosis not present

## 2014-08-07 DIAGNOSIS — R05 Cough: Secondary | ICD-10-CM

## 2014-08-07 DIAGNOSIS — R350 Frequency of micturition: Secondary | ICD-10-CM

## 2014-08-07 LAB — POCT UA - MICROSCOPIC ONLY
Casts, Ur, LPF, POC: NEGATIVE
Crystals, Ur, HPF, POC: NEGATIVE
Mucus, UA: NEGATIVE
RBC, urine, microscopic: NEGATIVE
Yeast, UA: NEGATIVE

## 2014-08-07 LAB — POCT URINALYSIS DIPSTICK
Bilirubin, UA: NEGATIVE
Blood, UA: NEGATIVE
Glucose, UA: NEGATIVE
Ketones, UA: NEGATIVE
Nitrite, UA: NEGATIVE
Protein, UA: NEGATIVE
Spec Grav, UA: <=1.005
Urobilinogen, UA: 0.2
pH, UA: 5.5

## 2014-08-07 MED ORDER — OXYCODONE HCL 5 MG PO TABS: 5.0000 mg | ORAL_TABLET | Freq: Four times a day (QID) | ORAL | Status: DC | PRN

## 2014-08-07 MED ORDER — MONTELUKAST SODIUM 10 MG PO TABS: 10.0000 mg | ORAL_TABLET | Freq: Every day | ORAL | Status: DC

## 2014-08-07 MED ORDER — AZITHROMYCIN 250 MG PO TABS: ORAL_TABLET | ORAL | Status: DC

## 2014-08-07 NOTE — Patient Instructions (Signed)
Take 1 and 1/2 coumadin 5 mg tablets every day.

## 2014-08-07 NOTE — Progress Notes (Addendum)
Subjective:  This chart was scribed for [name], MD by Randa Evens, ED Scribe. This Patient was seen in room 25 and the patients care was started at 2:24 PM   Patient ID: Alicia Vasquez, female    DOB: 20-Mar-1941, 74 y.o.   MRN: [id number]  Chief Complaint  Patient presents with  . Follow-up  . hyperparathyroidism  . cough    still from bronchitis, left earache   . lower left back pain, left knee  . Medication Refill    oxycodone 5 mg    HPI HPI Comments: Alicia Vasquez is a 74 y.o. female who presents to the [facility] for follow up. Pt states she still has a porductive cough of yellow sputum that is improving from 2 weeks ago. Pt states she has taken the z-pack with no relief. Pt states she has associated left ear pain with slight hearing loss. Pt states she is currently still having low left sided back pain and left knee pain. Pt states that her pain is worse with physical therapy exercises. Pt states that she is compliant with taking her coumadin daily and skips 1 day every week so that her coumadin levels don't become too elevated. Pt would also like a medication refill of her oxycodone 5mg .    Past Medical History  Diagnosis Date  . Allergy   . Hypertension   . Hypothyroid   . Blood transfusion without reported diagnosis   . Pneumonia   . DVT (deep vein thrombosis) in pregnancy   . Breast cancer 07/12/13    invasive mammary carcinoma  . Anxiety   . Arthritis   . Radiation 11/21/13-01/07/14    Right Breast/supraclav.   Prior to Admission medications   Medication Sig Start Date End Date Taking? Authorizing Provider  acetaminophen (TYLENOL) 500 MG tablet Take 500 mg by mouth every 6 (six) hours as needed for mild pain, fever or headache.   Yes Historical Provider, MD  chlorhexidine (PERIDEX) 0.12 % solution Use as directed 15 mLs in the mouth or throat 2 (two) times daily. 07/27/14  Yes [name], MD  Cholecalciferol (VITAMIN D3) 5000  UNITS TABS Take by mouth.   Yes Historical Provider, MD  HYDROcodone-homatropine (HYCODAN) 5-1.5 MG/5ML syrup Take 5 mLs by mouth every 8 (eight) hours as needed for cough. 07/27/14  Yes [name], MD  levothyroxine (SYNTHROID) 125 MCG tablet Take 125 mcg by mouth daily before breakfast. Dispense as written: Synthroid   Yes Historical Provider, MD  non-metallic deodorant Jethro Poling) MISC Apply 1 application topically daily as needed. 11/27/13  Yes Thea Silversmith, MD  oxyCODONE (OXY IR/ROXICODONE) 5 MG immediate release tablet Take 1 tablet (5 mg total) by mouth every 6 (six) hours as needed. 06/05/14  Yes [name], MD  warfarin (COUMADIN) 5 MG tablet TAKE AS DIRECTED BY ANTICOAGULATION CLINIC 04/15/14  Yes Mancel Bale, PA-C  anastrozole (ARIMIDEX) 1 MG tablet Take 1 tablet (1 mg total) by mouth daily. Patient not taking: Reported on 07/27/2014 01/30/14   Chauncey Cruel, MD  hyaluronate sodium (RADIAPLEXRX) GEL Apply 1 application topically 2 (two) times daily. Apply 2x day after rad tx and bedtime daily and on weekends on rt breast 11/26/13   Thea Silversmith, MD  lisinopril (PRINIVIL,ZESTRIL) 10 MG tablet Take 1 tablet (10 mg total) by mouth daily. 12/26/13   [name], MD    Review of Systems  HENT: Positive for ear pain and hearing loss.   Respiratory: Positive  for cough.   Musculoskeletal: Positive for back pain and arthralgias.  All other systems reviewed and are negative.   Objective:   BP 138/70 mmHg  Pulse 77  Temp(Src) 97.4 F (36.3 C) (Oral)  Resp 16  Ht 5\' 4"  (1.626 m)  Wt 227 lb 9.6 oz (103.239 kg)  BMI 39.05 kg/m2  SpO2 96%   Physical Exam  Constitutional: She is oriented to person, place, and time. She appears well-developed and well-nourished. No distress.  HENT:  Head: Normocephalic and atraumatic.  Eyes: Conjunctivae and EOM are normal.  Neck: Neck supple. No tracheal deviation present.  Cardiovascular: Normal rate and regular rhythm.   No murmur  heard. Pulmonary/Chest: Effort normal and breath sounds normal. No respiratory distress. She has no wheezes. She has no rales.  Swelling in right supraclavicular area  Abdominal:  No flank tenderness.   Musculoskeletal: Normal range of motion. She exhibits no edema.  Neurological: She is alert and oriented to person, place, and time.  Skin: Skin is warm and dry.  Psychiatric: She has a normal mood and affect. Her behavior is normal.  Nursing note and vitals reviewed.  Results for orders placed or performed in visit on 08/07/14  POCT urinalysis dipstick  Result Value Ref Range   Color, UA lt. yellow    Clarity, UA clear    Glucose, UA neg    Bilirubin, UA neg    Ketones, UA neg    Spec Grav, UA <=1.005    Blood, UA neg    pH, UA 5.5    Protein, UA neg    Urobilinogen, UA 0.2    Nitrite, UA neg    Leukocytes, UA Trace   POCT UA - Microscopic Only  Result Value Ref Range   WBC, Ur, HPF, POC 0-1    RBC, urine, microscopic neg    Bacteria, U Microscopic trace    Mucus, UA neg    Epithelial cells, urine per micros 0-3    Crystals, Ur, HPF, POC neg    Casts, Ur, LPF, POC neg    Yeast, UA neg      Assessment & Plan:   This chart was scribed in my presence and reviewed by me personally.    ICD-9-CM ICD-10-CM   1. Acute bronchitis, unspecified organism 466.0 J20.9 azithromycin (ZITHROMAX Z-PAK) 250 MG tablet  2. Urinary frequency 788.41 R35.0 POCT urinalysis dipstick     POCT UA - Microscopic Only     Urine culture  3. Chest wall pain 786.52 R07.89 oxyCODONE (OXY IR/ROXICODONE) 5 MG immediate release tablet  4. Cough 786.2 R05 montelukast (SINGULAIR) 10 MG tablet   Recheck calcium and parathyroid in 2 months  Signed, [name], MD

## 2014-08-08 ENCOUNTER — Ambulatory Visit: Payer: Medicare Other | Admitting: Physical Therapy

## 2014-08-08 DIAGNOSIS — M549 Dorsalgia, unspecified: Secondary | ICD-10-CM | POA: Diagnosis not present

## 2014-08-08 DIAGNOSIS — M25511 Pain in right shoulder: Secondary | ICD-10-CM

## 2014-08-08 DIAGNOSIS — I890 Lymphedema, not elsewhere classified: Secondary | ICD-10-CM | POA: Diagnosis not present

## 2014-08-08 DIAGNOSIS — M25561 Pain in right knee: Secondary | ICD-10-CM

## 2014-08-08 DIAGNOSIS — M542 Cervicalgia: Secondary | ICD-10-CM | POA: Diagnosis not present

## 2014-08-08 DIAGNOSIS — M24511 Contracture, right shoulder: Secondary | ICD-10-CM | POA: Diagnosis not present

## 2014-08-08 DIAGNOSIS — M25562 Pain in left knee: Secondary | ICD-10-CM

## 2014-08-08 DIAGNOSIS — C50911 Malignant neoplasm of unspecified site of right female breast: Secondary | ICD-10-CM | POA: Diagnosis not present

## 2014-08-08 DIAGNOSIS — M545 Low back pain: Secondary | ICD-10-CM

## 2014-08-08 DIAGNOSIS — R5381 Other malaise: Secondary | ICD-10-CM

## 2014-08-08 DIAGNOSIS — Z9889 Other specified postprocedural states: Secondary | ICD-10-CM | POA: Diagnosis not present

## 2014-08-08 LAB — URINE CULTURE: Colony Count: >=100000

## 2014-08-08 NOTE — Therapy (Signed)
[place], [name], [address] Phone: [phone]   Fax:  [phone]  Physical Therapy Treatment  Patient Details  Name: Alicia Vasquez MRN: [id number] Date of Birth: [birth year] Referring Provider:  [name], MD  Encounter Date: 08/08/2014      PT End of Session - 08/08/14 1322    Visit Number 61   Number of Visits 37   Date for PT Re-Evaluation 10/10/14   PT Start Time 1018   PT Stop Time 1100   PT Time Calculation (min) 42 min      Past Medical History  Diagnosis Date  . Allergy   . Hypertension   . Hypothyroid   . Blood transfusion without reported diagnosis   . Pneumonia   . DVT (deep vein thrombosis) in pregnancy   . Breast cancer 07/12/13    invasive mammary carcinoma  . Anxiety   . Arthritis   . Radiation 11/21/13-01/07/14    Right Breast/supraclav.    Past Surgical History  Procedure Laterality Date  . Removal of teeth  04/19/2012    13 teeth removed  . Biospy      female organs  . Dilation and curettage of uterus    . Breast lumpectomy with radioactive seed localization Right 09/09/2013    Procedure: BREAST LUMPECTOMY WITH RADIOACTIVE SEED LOCALIZATION WITH AXILLARY NODE EXCISION;  Surgeon: [name], MD;  Location: [facility];  Service: General;  Laterality: Right;  . Re-excision of breast lumpectomy Right 09/24/2013    Procedure: RE-EXCISION OF RIGHT BREAST LUMPECTOMY;  Surgeon: [name], MD;  Location: [facility];  Service: General;  Laterality: Right;    There were no vitals filed for this visit.  Visit Diagnosis:  Physical deconditioning  Pain in joint, shoulder region, right  Bilateral low back pain, with sciatica presence unspecified  Arthralgia of both knees      Subjective Assessment - 08/08/14 1038    Symptoms pt went for [facility] evaluation and went to see Dr. Joseph Art.  He did not approve the medical  release to allow her to participate in the [place] program at this time. She reports she is doing her home exercise program twice a week.She says that Dr. Joseph Art said she should stilck physical therapy her.    Patient Stated Goals pt wants to be more active physically and to be able to walk a long distance without getting so short of breath.            [facility] PT Assessment - 08/08/14 0001    Berg Balance Test   Sit to Stand Able to stand without using hands and stabilize independently   Standing Unsupported Able to stand safely 2 minutes   Sitting with Back Unsupported but Feet Supported on Floor or Stool Able to sit safely and securely 2 minutes  c/o pain that increases with length of time standing   Stand to Sit Controls descent by using hands   Transfers Able to transfer safely, definite need of hands   Standing Unsupported with Eyes Closed Able to stand 10 seconds safely   Standing Ubsupported with Feet Together Able to place feet together independently and stand for 1 minute with supervision   From Standing, Reach Forward with Outstretched Arm Reaches forward but needs supervision   From Standing Position, Pick up Object from Floor Unable to pick up and needs supervision   From Standing Position, Turn to Look Behind Over each Shoulder Needs supervision  when turning   Turn 360 Degrees Able to turn 360 degrees safely but slowly  pain in left leg   Standing Unsupported, Alternately Place Feet on Step/Stool Able to complete >2 steps/needs minimal assist   Standing Unsupported, One Foot in Front Needs help to step but can hold 15 seconds  limited by pain in left leg.   Standing on One Leg Tries to lift leg/unable to hold 3 seconds but remains standing independently   Total Score 33                   [facility] Adult PT Treatment/Exercise - 08/08/14 0001    Knee/Hip Exercises: Aerobic   Stationary Bike nustep level 3 for 15 minutes with legs only                    Short Term Clinic Goals - 08/08/14 1338    CC Short Term Goal  #1   Title pt will report pain in low back at 2/10   CC Short Term Goal  #2   Title Increase Berg Balance Assessment score to 40/56  baseline today [facility] Goals - 08/08/14 1340    CC Long Term Goal  #1   Title independence in upgraded  home exercise program to include balance activities   Time 8   Period Weeks   Status New   CC Long Term Goal  #2   Title Increase Berg Balance Assessment score to 40/56  baseline 33/56   Time 8   Period Weeks   Status New            Plan - 08/08/14 1323    Clinical Impression Statement Pt reports she went for orientation to the [employer] program, but had difficutly with some of the movements and is experiencing increased pain.  Dr, Joseph Art did not sign her medical release and wants her to continue with therapy here. Her goal remains the same: to be able to walk further without shortness of breath and to have less pain.  Pt reports she is doing home exercise 2 times a week. and that she is keeping a record, but she has not brought it in so I do not have a clear picture of her follow through at home . We have had many conversations about silver sneakers and other communty exercise programs, but she has not followed through with any except the [employer]. She says now she will investigate [employer], and possible aquatics programs. She says her pain is part of her parathyroid problems (?)  She does not have a clear plan to continue with exercise on her own that she is able to follow through so will continue to see her here and upgrade her program to focus on balance issues,   Pt will benefit from skilled therapeutic intervention in order to improve on the following deficits Decreased strength;Difficulty walking;Pain;Decreased activity tolerance;Decreased endurance;Decreased balance;Impaired perceived functional ability   Rehab Potential Good    Clinical Impairments Affecting Rehab Potential medical issues with parathyroid and coumadin regulation, arthritis in knees and back, effects of cancer surgery and radiation.   PT Frequency 2x / week   PT Duration 8 weeks   PT Treatment/Interventions Therapeutic exercise;Patient/family education;Neuromuscular re-education;Balance training   PT Next Visit Plan perfrom TUG, DGI, 6 minute walk test for more baseline measurements. Consider walking on the treadmill    PT Home Exercise Plan continue  with home exerice program as established   Recommended Other Services ?? posssible pain clinic referral if pain continues to be an issue??   Consulted and Agree with Plan of Care Patient        Problem List Patient Active Problem List   Diagnosis Date Noted  . Vitamin D deficiency 04/23/2014  . Hyperparathyroidism 04/23/2014  . Depression 07/18/2013  . Breast cancer of upper-outer quadrant of right female breast 07/15/2013  . Encounter for therapeutic drug monitoring 07/05/2013  . Overactive bladder 01/30/2013  . DVT (deep venous thrombosis), left 12/19/2012  . HTN (hypertension) 09/27/2011  . Hearing loss 09/27/2011  . Chest wall pain 09/27/2011  . Seasonal allergies 09/27/2011  . SOB (shortness of breath), related to deconditioning 08/26/2011  . Hypothyroid 08/26/2011   [name]. Owens Shark, PT   08/08/2014, 1:42 PM  [place], [name], [address] Phone: [phone]   Fax:  [phone]

## 2014-08-14 ENCOUNTER — Ambulatory Visit: Payer: Medicare Other | Admitting: Physical Therapy

## 2014-08-14 DIAGNOSIS — M25562 Pain in left knee: Secondary | ICD-10-CM

## 2014-08-14 DIAGNOSIS — I890 Lymphedema, not elsewhere classified: Secondary | ICD-10-CM | POA: Diagnosis not present

## 2014-08-14 DIAGNOSIS — M545 Low back pain: Secondary | ICD-10-CM

## 2014-08-14 DIAGNOSIS — C50911 Malignant neoplasm of unspecified site of right female breast: Secondary | ICD-10-CM | POA: Diagnosis not present

## 2014-08-14 DIAGNOSIS — M25561 Pain in right knee: Secondary | ICD-10-CM

## 2014-08-14 DIAGNOSIS — M24511 Contracture, right shoulder: Secondary | ICD-10-CM | POA: Diagnosis not present

## 2014-08-14 DIAGNOSIS — M542 Cervicalgia: Secondary | ICD-10-CM | POA: Diagnosis not present

## 2014-08-14 DIAGNOSIS — M25511 Pain in right shoulder: Secondary | ICD-10-CM

## 2014-08-14 DIAGNOSIS — Z9889 Other specified postprocedural states: Secondary | ICD-10-CM | POA: Diagnosis not present

## 2014-08-14 DIAGNOSIS — R5381 Other malaise: Secondary | ICD-10-CM

## 2014-08-14 DIAGNOSIS — M549 Dorsalgia, unspecified: Secondary | ICD-10-CM | POA: Diagnosis not present

## 2014-08-14 NOTE — Therapy (Signed)
[place], [name], [address] Phone: [phone]   Fax:  [phone]  Physical Therapy Treatment  Patient Details  Name: Alicia Vasquez MRN: [id number] Date of Birth: July 16, 1940 Referring Provider:  [name], MD  Encounter Date: 08/14/2014      PT End of Session - 08/14/14 1744    Visit Number 72   Number of Visits 30   Date for PT Re-Evaluation 10/10/14   PT Start Time 8841   PT Stop Time 1430   PT Time Calculation (min) 45 min      Past Medical History  Diagnosis Date  . Allergy   . Hypertension   . Hypothyroid   . Blood transfusion without reported diagnosis   . Pneumonia   . DVT (deep vein thrombosis) in pregnancy   . Breast cancer 07/12/13    invasive mammary carcinoma  . Anxiety   . Arthritis   . Radiation 11/21/13-01/07/14    Right Breast/supraclav.    Past Surgical History  Procedure Laterality Date  . Removal of teeth  04/19/2012    13 teeth removed  . Biospy      female organs  . Dilation and curettage of uterus    . Breast lumpectomy with radioactive seed localization Right 09/09/2013    Procedure: BREAST LUMPECTOMY WITH RADIOACTIVE SEED LOCALIZATION WITH AXILLARY NODE EXCISION;  Surgeon: [name], MD;  Location: [facility];  Service: General;  Laterality: Right;  . Re-excision of breast lumpectomy Right 09/24/2013    Procedure: RE-EXCISION OF RIGHT BREAST LUMPECTOMY;  Surgeon: [name], MD;  Location: [facility];  Service: General;  Laterality: Right;    There were no vitals filed for this visit.  Visit Diagnosis:  Physical deconditioning  Pain in joint, shoulder region, right  Bilateral low back pain, with sciatica presence unspecified  Arthralgia of both knees      Subjective Assessment - 08/14/14 1354    Symptoms pt has cat scratch on right volar forearm and 2 small wounds on medial arm just above antecubital  fossa that is unknown if scratch or a nip. pt sitll taking antibiotics for brochitis and UTI, She says she is feeling bit has pain in back and under right arm especially when she gets up in the morning   Currently in Pain? Yes   Pain Score 4    Pain Location Back   Pain Orientation Right;Left;Lower   Pain Descriptors / Indicators Aching   Pain Type Chronic pain   Pain Score 3   Pain Type Chronic pain   Pain Location Axilla   Pain Orientation Right   Pain Radiating Towards axilla under right breast    Pain Descriptors / Indicators Stabbing                       [facility] Adult PT Treatment/Exercise - 08/14/14 0001    High Level Balance   High Level Balance Activities Side stepping;Backward walking;Direction changes;Turns;Sudden stops  focused on larger steps and changing speeds   High Level Balance Comments needs frequent verbal cues for technique. diifficulty with larger steps    Neck Exercises: Seated   Cervical Rotation Both   Lateral Flexion Both;5 reps   Shoulder Shrugs 5 reps   Shoulder Rolls Backwards;5 reps   Shoulder Flexion 10 reps  with cane    Upper Extremity D1 Theraband   Lumbar Exercises: Supine   Bridge 5 reps   Other Supine  Lumbar Exercises lower trunk rotations   Knee/Hip Exercises: Aerobic   Tread Mill 90 sec at very slow speed pt only able to take short steps, had multrilpe c/o dizziness  and pain under back and arm.  HR 85 o2 sat 96.    Knee/Hip Exercises: Seated   Other Seated Knee Exercises marches with trunk extension   Shoulder Exercises: Supine   Other Supine Exercises cane exercise for shoulder flexion   Other Supine Exercises red theraband "draw the sword"                PT Education - 08/14/14 1750    Education provided Yes   Education Details red theraband upper extremity strengthening   Person(s) Educated Patient   Methods Explanation;Demonstration;Handout   Comprehension Verbalized understanding;Returned demonstration            Short Term Clinic Goals - 08/08/14 1338    CC Short Term Goal  #1   Title pt will report pain in low back at 2/10   CC Short Term Goal  #2   Title Increase Berg Balance Assessment score to 40/56  baseline today [facility] Goals - 08/08/14 1340    CC Long Term Goal  #1   Title independence in upgraded  home exercise program to include balance activities   Time 8   Period Weeks   Status New   CC Long Term Goal  #2   Title Increase Berg Balance Assessment score to 40/56  baseline 33/56   Time 8   Period Weeks   Status New            Plan - 08/14/14 1746    Clinical Impression Statement Attempted to ambulate on treadmill, but pt did not do well on this.  She appeared very uneasy, took small steps and was too far back on belt and c/o being dizzy.  She did much better with high level balance activites in the hallway.  She did have dyspnea on exertion.    PT Next Visit Plan perfrom TUG, DGI, 6 minute walk test for more baseline measurements. continue with advanced gait techniques at parallel bars for support  Reasess for goals        Problem List Patient Active Problem List   Diagnosis Date Noted  . Vitamin D deficiency 04/23/2014  . Hyperparathyroidism 04/23/2014  . Depression 07/18/2013  . Breast cancer of upper-outer quadrant of right female breast 07/15/2013  . Encounter for therapeutic drug monitoring 07/05/2013  . Overactive bladder 01/30/2013  . DVT (deep venous thrombosis), left 12/19/2012  . HTN (hypertension) 09/27/2011  . Hearing loss 09/27/2011  . Chest wall pain 09/27/2011  . Seasonal allergies 09/27/2011  . SOB (shortness of breath), related to deconditioning 08/26/2011  . Hypothyroid 08/26/2011   [name]. Owens Shark, PT  08/14/2014, 5:51 PM  [place], [name], [address] Phone: [phone]   Fax:  [phone]

## 2014-08-14 NOTE — Patient Instructions (Signed)
Upward Diagonal (Resistive Band)   Using other hand as anchor, pull band up and out to side with palm facing up. Hold __2__ seconds. Repeat _10___ times. Do _2___ sessions per day.  Copyright  VHI. All rights reserved.

## 2014-08-18 ENCOUNTER — Other Ambulatory Visit: Payer: Self-pay | Admitting: Family Medicine

## 2014-08-19 ENCOUNTER — Encounter: Payer: Medicare Other | Admitting: Physical Therapy

## 2014-08-19 ENCOUNTER — Ambulatory Visit: Payer: Medicare Other | Admitting: Physical Therapy

## 2014-08-19 DIAGNOSIS — M25562 Pain in left knee: Secondary | ICD-10-CM

## 2014-08-19 DIAGNOSIS — M542 Cervicalgia: Secondary | ICD-10-CM | POA: Diagnosis not present

## 2014-08-19 DIAGNOSIS — Z9889 Other specified postprocedural states: Secondary | ICD-10-CM | POA: Diagnosis not present

## 2014-08-19 DIAGNOSIS — M25561 Pain in right knee: Secondary | ICD-10-CM

## 2014-08-19 DIAGNOSIS — M24511 Contracture, right shoulder: Secondary | ICD-10-CM | POA: Diagnosis not present

## 2014-08-19 DIAGNOSIS — C50911 Malignant neoplasm of unspecified site of right female breast: Secondary | ICD-10-CM | POA: Diagnosis not present

## 2014-08-19 DIAGNOSIS — M25511 Pain in right shoulder: Secondary | ICD-10-CM

## 2014-08-19 DIAGNOSIS — R5381 Other malaise: Secondary | ICD-10-CM

## 2014-08-19 DIAGNOSIS — M549 Dorsalgia, unspecified: Secondary | ICD-10-CM | POA: Diagnosis not present

## 2014-08-19 DIAGNOSIS — M545 Low back pain: Secondary | ICD-10-CM

## 2014-08-19 DIAGNOSIS — I890 Lymphedema, not elsewhere classified: Secondary | ICD-10-CM | POA: Diagnosis not present

## 2014-08-19 NOTE — Therapy (Signed)
[place], [name], [address] Phone: [phone]   Fax:  [phone]  Physical Therapy Treatment  Patient Details  Name: Alicia Vasquez MRN: [id number] Date of Birth: 02/20/1941 Referring Provider:  [name], MD  Encounter Date: 08/19/2014      PT End of Session - 08/19/14 1723    Visit Number 24   Number of Visits 29   Date for PT Re-Evaluation 10/10/14   PT Start Time 1430   PT Stop Time 1510   PT Time Calculation (min) 40 min      Past Medical History  Diagnosis Date  . Allergy   . Hypertension   . Hypothyroid   . Blood transfusion without reported diagnosis   . Pneumonia   . DVT (deep vein thrombosis) in pregnancy   . Breast cancer 07/12/13    invasive mammary carcinoma  . Anxiety   . Arthritis   . Radiation 11/21/13-01/07/14    Right Breast/supraclav.    Past Surgical History  Procedure Laterality Date  . Removal of teeth  04/19/2012    13 teeth removed  . Biospy      female organs  . Dilation and curettage of uterus    . Breast lumpectomy with radioactive seed localization Right 09/09/2013    Procedure: BREAST LUMPECTOMY WITH RADIOACTIVE SEED LOCALIZATION WITH AXILLARY NODE EXCISION;  Surgeon: [name], MD;  Location: [facility];  Service: General;  Laterality: Right;  . Re-excision of breast lumpectomy Right 09/24/2013    Procedure: RE-EXCISION OF RIGHT BREAST LUMPECTOMY;  Surgeon: [name], MD;  Location: [facility];  Service: General;  Laterality: Right;    There were no vitals filed for this visit.  Visit Diagnosis:  Physical deconditioning  Pain in joint, shoulder region, right  Bilateral low back pain, with sciatica presence unspecified  Arthralgia of both knees      Subjective Assessment - 08/19/14 1441    Symptoms I see dr. Donne Hazel at 3;30 today    Currently in Pain? Yes   Pain Score 4    Pain Location  Back   Pain Orientation Right   Pain Descriptors / Indicators Jabbing   Pain Type Chronic pain   Pain Score 3   Pain Type Chronic pain   Pain Location Arm   Pain Orientation Right;Upper                       [facility] Adult PT Treatment/Exercise - 08/19/14 0001    Ambulation/Gait   Ambulation/Gait Assistance 5: Supervision  in parrallel bars, side stepping and marchin, backward   Lumbar Exercises: Supine   Ab Set 5 reps   Glut Set 5 reps   Other Supine Lumbar Exercises spinal decompression   Knee/Hip Exercises: Seated   Other Seated Knee Exercises marches with trunk extension 10 reps   Knee/Hip Exercises: Supine   Short Arc Quad Sets Strengthening;Both;10 reps;3 sets  4#   Bridges AROM  with yellow  ball between knees   Other Supine Knee Exercises hip abduction 10 reps   Shoulder Exercises: Supine   Other Supine Exercises cane exercise for shoulder flexion   Other Supine Exercises red theraband "draw the sword"   Ankle Exercises: Seated   Heel Raises 10 reps                   Short Term Clinic Goals - 08/08/14 1338    CC Short Term Goal  #  1   Title pt will report pain in low back at 2/10   CC Short Term Goal  #2   Title Increase Berg Balance Assessment score to 40/56  baseline today 33/56             Long Term Clinic Goals - 08/08/14 1340    CC Long Term Goal  #1   Title independence in upgraded  home exercise program to include balance activities   Time 8   Period Weeks   Status New   CC Long Term Goal  #2   Title Increase Berg Balance Assessment score to 40/56  baseline 33/56   Time 8   Period Weeks   Status New            Plan - 08/19/14 1724    Clinical Impression Statement pt conitnues with c/o pain and shortness of breath with ambulation activities but appears to be doing well overall.    Clinical Impairments Affecting Rehab Potential medical issues with parathyroid and coumadin regulation, arthritis in knees and back,  effects of cancer surgery and radiation.   PT Treatment/Interventions ADLs/Self Care Home Management   PT Next Visit Plan perfrom TUG, DGI, 6 minute walk test for more baseline measurements. continue with advanced gait techniques at parallel bars for support  Reasess for goals        Problem List Patient Active Problem List   Diagnosis Date Noted  . Vitamin D deficiency 04/23/2014  . Hyperparathyroidism 04/23/2014  . Depression 07/18/2013  . Breast cancer of upper-outer quadrant of right female breast 07/15/2013  . Encounter for therapeutic drug monitoring 07/05/2013  . Overactive bladder 01/30/2013  . DVT (deep venous thrombosis), left 12/19/2012  . HTN (hypertension) 09/27/2011  . Hearing loss 09/27/2011  . Chest wall pain 09/27/2011  . Seasonal allergies 09/27/2011  . SOB (shortness of breath), related to deconditioning 08/26/2011  . Hypothyroid 08/26/2011   [name]. Owens Shark, PT 08/19/2014, [place], [name], [address] Phone: [phone]   Fax:  [phone]

## 2014-08-20 ENCOUNTER — Ambulatory Visit: Payer: Medicare Other | Admitting: Physical Therapy

## 2014-08-20 ENCOUNTER — Telehealth: Payer: Self-pay | Admitting: Nurse Practitioner

## 2014-08-20 ENCOUNTER — Other Ambulatory Visit: Payer: Self-pay | Admitting: *Deleted

## 2014-08-20 ENCOUNTER — Other Ambulatory Visit: Payer: Self-pay | Admitting: Oncology

## 2014-08-20 DIAGNOSIS — R5381 Other malaise: Secondary | ICD-10-CM

## 2014-08-20 DIAGNOSIS — M549 Dorsalgia, unspecified: Secondary | ICD-10-CM | POA: Diagnosis not present

## 2014-08-20 DIAGNOSIS — I890 Lymphedema, not elsewhere classified: Secondary | ICD-10-CM | POA: Diagnosis not present

## 2014-08-20 DIAGNOSIS — Z9889 Other specified postprocedural states: Secondary | ICD-10-CM | POA: Diagnosis not present

## 2014-08-20 DIAGNOSIS — M545 Low back pain: Secondary | ICD-10-CM

## 2014-08-20 DIAGNOSIS — M542 Cervicalgia: Secondary | ICD-10-CM | POA: Diagnosis not present

## 2014-08-20 DIAGNOSIS — M25561 Pain in right knee: Secondary | ICD-10-CM

## 2014-08-20 DIAGNOSIS — M25511 Pain in right shoulder: Secondary | ICD-10-CM

## 2014-08-20 DIAGNOSIS — M25562 Pain in left knee: Secondary | ICD-10-CM

## 2014-08-20 DIAGNOSIS — C50911 Malignant neoplasm of unspecified site of right female breast: Secondary | ICD-10-CM | POA: Diagnosis not present

## 2014-08-20 DIAGNOSIS — M24511 Contracture, right shoulder: Secondary | ICD-10-CM | POA: Diagnosis not present

## 2014-08-20 NOTE — Telephone Encounter (Signed)
Spoke with patient and she is aware of her 3/30 appointment

## 2014-08-20 NOTE — Therapy (Signed)
[place], [name], [address] Phone: [phone]   Fax:  [phone]  Physical Therapy Treatment  Patient Details  Name: Alicia Vasquez MRN: [id number] Date of Birth: 02-26-1941 Referring Provider:  [name], MD  Encounter Date: 08/20/2014      PT End of Session - 08/20/14 1227    Visit Number 77   Number of Visits 55   Date for PT Re-Evaluation 10/10/14   PT Start Time 5809   PT Stop Time 1100   PT Time Calculation (min) 45 min      Past Medical History  Diagnosis Date  . Allergy   . Hypertension   . Hypothyroid   . Blood transfusion without reported diagnosis   . Pneumonia   . DVT (deep vein thrombosis) in pregnancy   . Breast cancer 07/12/13    invasive mammary carcinoma  . Anxiety   . Arthritis   . Radiation 11/21/13-01/07/14    Right Breast/supraclav.    Past Surgical History  Procedure Laterality Date  . Removal of teeth  04/19/2012    13 teeth removed  . Biospy      female organs  . Dilation and curettage of uterus    . Breast lumpectomy with radioactive seed localization Right 09/09/2013    Procedure: BREAST LUMPECTOMY WITH RADIOACTIVE SEED LOCALIZATION WITH AXILLARY NODE EXCISION;  Surgeon: [name], MD;  Location: [facility];  Service: General;  Laterality: Right;  . Re-excision of breast lumpectomy Right 09/24/2013    Procedure: RE-EXCISION OF RIGHT BREAST LUMPECTOMY;  Surgeon: [name], MD;  Location: [facility];  Service: General;  Laterality: Right;    There were no vitals filed for this visit.  Visit Diagnosis:  Physical deconditioning  Pain in joint, shoulder region, right  Bilateral low back pain, with sciatica presence unspecified  Arthralgia of both knees      Subjective Assessment - 08/20/14 1026    Symptoms I'm feeling better than I did, I'm not hurting as bad as I did. Visit with Dr. Donne Hazel went  well.  She has a follow up mammogram in April    Currently in Pain? Yes   Pain Score 2    Pain Location Back   Pain Score 2   Pain Location Shoulder   Pain Orientation Right;Upper            [facility] PT Assessment - 08/20/14 0001    Timed Up and Go Test   Normal TUG (seconds) 17.68   Manual TUG (seconds) 18.41   Cognitive TUG (seconds) 20.27                   [facility] Adult PT Treatment/Exercise - 08/20/14 0001    Ambulation/Gait   Ambulation/Gait Assistance 5: Supervision  in parrallel bars, side stepping and marchin, backward   Lumbar Exercises: Supine   Ab Set 5 reps   Glut Set --   Other Supine Lumbar Exercises spinal decompression   Knee/Hip Exercises: Seated   Other Seated Knee Exercises marches with trunk extension 10 reps on leg    Knee/Hip Exercises: Supine   Short Arc Quad Sets --   Bridges AROM;5 reps   Straight Leg Raises Strengthening  6 repetitions   Other Supine Knee Exercises hip abduction 10 reps   Shoulder Exercises: Supine   External Rotation Strengthening;10 reps;Theraband   Theraband Level (Shoulder External Rotation) Level 3 (Green)   Flexion Strengthening;10 reps;Theraband  narrow and wide  grip   Theraband Level (Shoulder Flexion) Level 3 (Green)   ABduction Strengthening;10 reps;Theraband   Theraband Level (Shoulder ABduction) Level 3 (Green)   Other Supine Exercises "draw the sword"  10 reps with each arm with green theraband   Ankle Exercises: Seated   Heel Raises 10 reps                PT Education - 08/20/14 1042    Education provided Yes   Education Details green theraband upper extremity strengthening   Person(s) Educated Patient   Methods Explanation;Demonstration;Handout   Comprehension Returned demonstration           Short Term Clinic Goals - 08/20/14 1230    CC Short Term Goal  #1   Title pt will report pain in low back at 2/10   Status On-going   CC Short Term Goal  #2   Title Increase Berg Balance  Assessment score to 40/56   Status On-going             Long Term Clinic Goals - 08/08/14 1340    CC Long Term Goal  #1   Title independence in upgraded  home exercise program to include balance activities   Time 8   Period Weeks   Status New   CC Long Term Goal  #2   Title Increase Berg Balance Assessment score to 40/56  baseline 33/56   Time 8   Period Weeks   Status New            Plan - 08/20/14 1228    Clinical Impression Statement Pt says she is feeling better today.  She did well with upgrade of home exrercise with increased theraband level and shoulder work today   PT Next Visit Plan perfrom  DGI, 6 minute walk test for more baseline measurements. continue with advanced gait techniques at parallel bars for support         Problem List Patient Active Problem List   Diagnosis Date Noted  . Vitamin D deficiency 04/23/2014  . Hyperparathyroidism 04/23/2014  . Depression 07/18/2013  . Breast cancer of upper-outer quadrant of right female breast 07/15/2013  . Encounter for therapeutic drug monitoring 07/05/2013  . Overactive bladder 01/30/2013  . DVT (deep venous thrombosis), left 12/19/2012  . HTN (hypertension) 09/27/2011  . Hearing loss 09/27/2011  . Chest wall pain 09/27/2011  . Seasonal allergies 09/27/2011  . SOB (shortness of breath), related to deconditioning 08/26/2011  . Hypothyroid 08/26/2011  [name]. Owens Shark, PT  08/20/2014, 12:31 PM  [place], [name], [address] Phone: [phone]   Fax:  [phone]

## 2014-08-20 NOTE — Telephone Encounter (Signed)
Dr L, it looks like [facility], but that we have Rxd this for pt. Do you Rx for this, or doesn't [facility] do that?

## 2014-08-24 ENCOUNTER — Other Ambulatory Visit: Payer: Self-pay | Admitting: Oncology

## 2014-08-26 ENCOUNTER — Ambulatory Visit: Payer: Medicare Other | Admitting: Physical Therapy

## 2014-08-26 ENCOUNTER — Other Ambulatory Visit: Payer: Self-pay | Admitting: *Deleted

## 2014-08-26 DIAGNOSIS — M25511 Pain in right shoulder: Secondary | ICD-10-CM

## 2014-08-26 DIAGNOSIS — M24511 Contracture, right shoulder: Secondary | ICD-10-CM | POA: Diagnosis not present

## 2014-08-26 DIAGNOSIS — Z9889 Other specified postprocedural states: Secondary | ICD-10-CM | POA: Diagnosis not present

## 2014-08-26 DIAGNOSIS — C50911 Malignant neoplasm of unspecified site of right female breast: Secondary | ICD-10-CM | POA: Diagnosis not present

## 2014-08-26 DIAGNOSIS — R5381 Other malaise: Secondary | ICD-10-CM

## 2014-08-26 DIAGNOSIS — M542 Cervicalgia: Secondary | ICD-10-CM | POA: Diagnosis not present

## 2014-08-26 DIAGNOSIS — I890 Lymphedema, not elsewhere classified: Secondary | ICD-10-CM | POA: Diagnosis not present

## 2014-08-26 DIAGNOSIS — M25562 Pain in left knee: Secondary | ICD-10-CM

## 2014-08-26 DIAGNOSIS — M545 Low back pain: Secondary | ICD-10-CM

## 2014-08-26 DIAGNOSIS — C50411 Malignant neoplasm of upper-outer quadrant of right female breast: Secondary | ICD-10-CM

## 2014-08-26 DIAGNOSIS — M549 Dorsalgia, unspecified: Secondary | ICD-10-CM | POA: Diagnosis not present

## 2014-08-26 DIAGNOSIS — M25561 Pain in right knee: Secondary | ICD-10-CM

## 2014-08-26 NOTE — Therapy (Signed)
[place], [name], [address] Phone: [phone]   Fax:  [phone]  Physical Therapy Treatment  Patient Details  Name: Alicia Vasquez MRN: [id number] Date of Birth: August 23, 1940 Referring Provider:  [name], MD  Encounter Date: 08/26/2014      PT End of Session - 08/26/14 1429    Visit Number 57   Number of Visits 26   Date for PT Re-Evaluation 10/10/14      Past Medical History  Diagnosis Date  . Allergy   . Hypertension   . Hypothyroid   . Blood transfusion without reported diagnosis   . Pneumonia   . DVT (deep vein thrombosis) in pregnancy   . Breast cancer 07/12/13    invasive mammary carcinoma  . Anxiety   . Arthritis   . Radiation 11/21/13-01/07/14    Right Breast/supraclav.    Past Surgical History  Procedure Laterality Date  . Removal of teeth  04/19/2012    13 teeth removed  . Biospy      female organs  . Dilation and curettage of uterus    . Breast lumpectomy with radioactive seed localization Right 09/09/2013    Procedure: BREAST LUMPECTOMY WITH RADIOACTIVE SEED LOCALIZATION WITH AXILLARY NODE EXCISION;  Surgeon: [name], MD;  Location: [facility];  Service: General;  Laterality: Right;  . Re-excision of breast lumpectomy Right 09/24/2013    Procedure: RE-EXCISION OF RIGHT BREAST LUMPECTOMY;  Surgeon: [name], MD;  Location: [facility];  Service: General;  Laterality: Right;    There were no vitals filed for this visit.  Visit Diagnosis:  Physical deconditioning  Pain in joint, shoulder region, right  Bilateral low back pain, with sciatica presence unspecified  Arthralgia of both knees      Subjective Assessment - 08/26/14 1346    Symptoms I go to the cancer center tomorrow. .She says she tried to vaccuum the floors and "I paid for it all day Sunday" with pain in her knees and back.   Currently in Pain? Yes   Pain Score 4    Pain Location Back   Pain Type Chronic pain   Pain Score 4   Pain Location Shoulder   Pain Orientation Right   Pain Type Chronic pain                       [facility] Adult PT Treatment/Exercise - 08/26/14 0001    High Level Balance   High Level Balance Activities Side stepping;Backward walking;Direction changes;Turns;Sudden stops  focused on larger steps and changing speeds   High Level Balance Comments needs frequent verbal cues for technique. diifficulty with larger steps    Neck Exercises: Seated   Cervical Rotation Both   Lateral Flexion Both;5 reps   Shoulder Shrugs 5 reps   Shoulder Rolls Backwards;5 reps   Shoulder Flexion 10 reps  with cane    Lumbar Exercises: Standing   Other Standing Lumbar Exercises standing  hip abduction    Other Standing Lumbar Exercises modified forward bending  for low back stretch   Lumbar Exercises: Supine   Ab Set 5 reps   Bent Knee Raise 10 reps   Bridge 5 reps   Other Supine Lumbar Exercises lower trunk rotations   Other Supine Lumbar Exercises spinal decompression   Knee/Hip Exercises: Aerobic   Tread Mill 90 sec at very slow speed pt only able to take short steps, had multrilpe c/o dizziness  and pain under back and arm.  HR 85 o2 sat 96.    Knee/Hip Exercises: Standing   Lateral Step Up Both;10 reps;Step Height: 4";Hand Hold: 1   Knee/Hip Exercises: Seated   Other Seated Knee Exercises marches with trunk extension 10 reps on leg    Knee/Hip Exercises: Supine   Bridges AROM;5 reps   Straight Leg Raises Strengthening;10 reps;AROM  with leg in external rotation   Other Supine Knee Exercises hip abduction 10 reps   Shoulder Exercises: Supine   External Rotation Strengthening;10 reps;Theraband   Theraband Level (Shoulder External Rotation) Level 3 (Green)   Flexion Strengthening;10 reps;Theraband  narrow and wide grip   Theraband Level (Shoulder Flexion) Level 3 (Green)   ABduction Strengthening;10  reps;Theraband   Theraband Level (Shoulder ABduction) Level 3 (Green)   Other Supine Exercises "draw the sword"  10 reps with each arm with green theraband   Other Supine Exercises red theraband "draw the sword"   Shoulder Exercises: Pulleys   Flexion 2 minutes   ABduction 2 minutes   Ankle Exercises: Seated   Heel Raises 10 reps                   Short Term Clinic Goals - 08/20/14 1230    CC Short Term Goal  #1   Title pt will report pain in low back at 2/10   Status On-going   CC Short Term Goal  #2   Title Increase Berg Balance Assessment score to 40/56   Status On-going             Long Term Clinic Goals - 08/08/14 1340    CC Long Term Goal  #1   Title independence in upgraded  home exercise program to include balance activities   Time 8   Period Weeks   Status New   CC Long Term Goal  #2   Title Increase Berg Balance Assessment score to 40/56  baseline 33/56   Time 8   Period Weeks   Status New            Plan - 08/26/14 1412    Clinical Impression Statement pt appears fatigued today, noted to have shortness of breath with ambulation and balance activities, was able to remember supine shoulder theraband exercises with few cues   PT Next Visit Plan 6 minute walk test for more baseline measurements. continue with advanced gait techniques at parallel bars for support         Problem List Patient Active Problem List   Diagnosis Date Noted  . Vitamin D deficiency 04/23/2014  . Hyperparathyroidism 04/23/2014  . Depression 07/18/2013  . Breast cancer of upper-outer quadrant of right female breast 07/15/2013  . Encounter for therapeutic drug monitoring 07/05/2013  . Overactive bladder 01/30/2013  . DVT (deep venous thrombosis), left 12/19/2012  . HTN (hypertension) 09/27/2011  . Hearing loss 09/27/2011  . Chest wall pain 09/27/2011  . Seasonal allergies 09/27/2011  . SOB (shortness of breath), related to deconditioning 08/26/2011  .  Hypothyroid 08/26/2011   [name]. Owens Shark, PT  08/26/2014, 2:30 PM  [place], [name], [address] Phone: [phone]   Fax:  [phone]

## 2014-08-27 ENCOUNTER — Encounter: Payer: Self-pay | Admitting: Nurse Practitioner

## 2014-08-27 ENCOUNTER — Ambulatory Visit: Payer: Medicare Other | Admitting: Physical Therapy

## 2014-08-27 ENCOUNTER — Other Ambulatory Visit (HOSPITAL_BASED_OUTPATIENT_CLINIC_OR_DEPARTMENT_OTHER): Payer: Medicare Other

## 2014-08-27 ENCOUNTER — Other Ambulatory Visit: Payer: Self-pay | Admitting: *Deleted

## 2014-08-27 ENCOUNTER — Other Ambulatory Visit: Payer: Self-pay

## 2014-08-27 ENCOUNTER — Telehealth: Payer: Self-pay | Admitting: Nurse Practitioner

## 2014-08-27 ENCOUNTER — Ambulatory Visit (HOSPITAL_BASED_OUTPATIENT_CLINIC_OR_DEPARTMENT_OTHER): Payer: Medicare Other | Admitting: Nurse Practitioner

## 2014-08-27 VITALS — BP 141/50 | HR 66 | Temp 97.5°F | Resp 18 | Ht 64.0 in | Wt 228.0 lb

## 2014-08-27 DIAGNOSIS — E039 Hypothyroidism, unspecified: Secondary | ICD-10-CM | POA: Diagnosis not present

## 2014-08-27 DIAGNOSIS — M549 Dorsalgia, unspecified: Secondary | ICD-10-CM | POA: Diagnosis not present

## 2014-08-27 DIAGNOSIS — C50411 Malignant neoplasm of upper-outer quadrant of right female breast: Secondary | ICD-10-CM | POA: Diagnosis not present

## 2014-08-27 DIAGNOSIS — M545 Low back pain: Secondary | ICD-10-CM

## 2014-08-27 DIAGNOSIS — C50911 Malignant neoplasm of unspecified site of right female breast: Secondary | ICD-10-CM

## 2014-08-27 DIAGNOSIS — M25561 Pain in right knee: Secondary | ICD-10-CM

## 2014-08-27 DIAGNOSIS — Z9889 Other specified postprocedural states: Secondary | ICD-10-CM | POA: Diagnosis not present

## 2014-08-27 DIAGNOSIS — M8580 Other specified disorders of bone density and structure, unspecified site: Secondary | ICD-10-CM

## 2014-08-27 DIAGNOSIS — M542 Cervicalgia: Secondary | ICD-10-CM | POA: Diagnosis not present

## 2014-08-27 DIAGNOSIS — Z1239 Encounter for other screening for malignant neoplasm of breast: Secondary | ICD-10-CM

## 2014-08-27 DIAGNOSIS — I890 Lymphedema, not elsewhere classified: Secondary | ICD-10-CM | POA: Diagnosis not present

## 2014-08-27 DIAGNOSIS — M25562 Pain in left knee: Secondary | ICD-10-CM

## 2014-08-27 DIAGNOSIS — M24511 Contracture, right shoulder: Secondary | ICD-10-CM | POA: Diagnosis not present

## 2014-08-27 DIAGNOSIS — M25511 Pain in right shoulder: Secondary | ICD-10-CM

## 2014-08-27 DIAGNOSIS — R5381 Other malaise: Secondary | ICD-10-CM

## 2014-08-27 LAB — COMPREHENSIVE METABOLIC PANEL (CC13)
ALT: 24 U/L (ref 0–55)
ANION GAP: 12 meq/L — AB (ref 3–11)
AST: 18 U/L (ref 5–34)
Albumin: 3.8 g/dL (ref 3.5–5.0)
Alkaline Phosphatase: 76 U/L (ref 40–150)
BILIRUBIN TOTAL: 0.36 mg/dL (ref 0.20–1.20)
BUN: 12.5 mg/dL (ref 7.0–26.0)
CALCIUM: 10.1 mg/dL (ref 8.4–10.4)
CO2: 24 meq/L (ref 22–29)
Chloride: 105 mEq/L (ref 98–109)
Creatinine: 0.7 mg/dL (ref 0.6–1.1)
EGFR: 79 mL/min/{1.73_m2} — AB (ref >90)
Glucose: 110 mg/dl (ref 70–140)
Potassium: 4.6 mEq/L (ref 3.5–5.1)
Sodium: 141 mEq/L (ref 136–145)
TOTAL PROTEIN: 6.7 g/dL (ref 6.4–8.3)

## 2014-08-27 LAB — CBC WITH DIFFERENTIAL/PLATELET
BASO%: 1.3 % (ref 0.0–2.0)
Basophils Absolute: 0.1 10*3/uL (ref 0.0–0.1)
EOS ABS: 0.2 10*3/uL (ref 0.0–0.5)
EOS%: 4.6 % (ref 0.0–7.0)
HCT: 43 % (ref 34.8–46.6)
HGB: 13.8 g/dL (ref 11.6–15.9)
LYMPH%: 20.8 % (ref 14.0–49.7)
MCH: 29.7 pg (ref 25.1–34.0)
MCHC: 32.2 g/dL (ref 31.5–36.0)
MCV: 92.3 fL (ref 79.5–101.0)
MONO#: 0.8 10*3/uL (ref 0.1–0.9)
MONO%: 14.9 % — ABNORMAL HIGH (ref 0.0–14.0)
NEUT%: 58.4 % (ref 38.4–76.8)
NEUTROS ABS: 3.1 10*3/uL (ref 1.5–6.5)
PLATELETS: 227 10*3/uL (ref 145–400)
RBC: 4.66 10*6/uL (ref 3.70–5.45)
RDW: 14.3 % (ref 11.2–14.5)
WBC: 5.3 10*3/uL (ref 3.9–10.3)
lymph#: 1.1 10*3/uL (ref 0.9–3.3)

## 2014-08-27 NOTE — Addendum Note (Signed)
Addended by: Marcelino Duster on: 08/27/2014 05:48 PM   Modules accepted: Orders

## 2014-08-27 NOTE — Therapy (Signed)
[place], [name], [address] Phone: [phone]   Fax:  [phone]  Physical Therapy Treatment  Patient Details  Name: Alicia Vasquez MRN: [id number] Date of Birth: 1941-02-25 Referring Provider:  [name], MD  Encounter Date: 08/27/2014      PT End of Session - 08/26/14 1429    Visit Number 63   Number of Visits 42   Date for PT Re-Evaluation 10/10/14      Past Medical History  Diagnosis Date  . Allergy   . Hypertension   . Hypothyroid   . Blood transfusion without reported diagnosis   . Pneumonia   . DVT (deep vein thrombosis) in pregnancy   . Breast cancer 07/12/13    invasive mammary carcinoma  . Anxiety   . Arthritis   . Radiation 11/21/13-01/07/14    Right Breast/supraclav.    Past Surgical History  Procedure Laterality Date  . Removal of teeth  04/19/2012    13 teeth removed  . Biospy      female organs  . Dilation and curettage of uterus    . Breast lumpectomy with radioactive seed localization Right 09/09/2013    Procedure: BREAST LUMPECTOMY WITH RADIOACTIVE SEED LOCALIZATION WITH AXILLARY NODE EXCISION;  Surgeon: [name], MD;  Location: [facility];  Service: General;  Laterality: Right;  . Re-excision of breast lumpectomy Right 09/24/2013    Procedure: RE-EXCISION OF RIGHT BREAST LUMPECTOMY;  Surgeon: [name], MD;  Location: [place];  Service: General;  Laterality: Right;    There were no vitals filed for this visit.  Visit Diagnosis:  Physical deconditioning  Pain in joint, shoulder region, right  Bilateral low back pain, with sciatica presence unspecified  Arthralgia of both knees      Subjective Assessment - 08/27/14 1123    Symptoms pt has appointment at cancer center today and plans to go shopping at [facility] later today where she usually gets a motorized cart   Currently in Pain? Yes   Pain Score 4    Pain Location Back            [facility] PT Assessment - 08/27/14 0001    6 Minute Walk- Baseline   6 Minute Walk- Baseline yes  541.5 feet, pt slowed down at end,3 standing rest breaks    Modified Borg Scale for Dyspnea 3- Moderate shortness of breath or breathing difficulty   Perceived Rate of Exertion (Borg) 15- Hard                   [facility] Adult PT Treatment/Exercise - 08/27/14 0001    High Level Balance   High Level Balance Activities Side stepping;Backward walking;Direction changes;Turns;Sudden stops  focused on larger steps and changing speeds   High Level Balance Comments worked in [employer] today, better performance   Neck Exercises: Seated   Cervical Rotation Both   Lateral Flexion Both;5 reps   Shoulder Shrugs 5 reps   Shoulder Rolls Backwards;5 reps   Shoulder Flexion 10 reps  with cane    Lumbar Exercises: Standing   Other Standing Lumbar Exercises standing  hip abduction    Other Standing Lumbar Exercises modified forward bending  for low back stretch   Lumbar Exercises: Supine   Ab Set 5 reps   Bent Knee Raise 10 reps   Bridge 5 reps   Other Supine Lumbar Exercises lower trunk rotations   Other Supine Lumbar Exercises spinal decompression  Knee/Hip Exercises: Aerobic   Tread Mill 90 sec at very slow speed pt only able to take short steps, had multrilpe c/o dizziness  and pain under back and arm.  HR 85 o2 sat 96.    Knee/Hip Exercises: Standing   Lateral Step Up Both;10 reps;Step Height: 4";Hand Hold: 1   Knee/Hip Exercises: Seated   Other Seated Knee Exercises marches with trunk extension 10 reps on leg    Knee/Hip Exercises: Supine   Short Arc Quad Sets Strengthening;Both;10 reps;3 sets  4#   Bridges AROM;5 reps   Straight Leg Raises Strengthening;10 reps;AROM  with leg in external rotation   Shoulder Exercises: Pulleys   Flexion 2 minutes   ABduction 2 minutes   Ankle Exercises: Seated   Heel Raises 10 reps                    Short Term Clinic Goals - 08/27/14 1157    CC Short Term Goal  #1   Title pt will report pain in low back at 2/10   Status On-going   CC Short Term Goal  #2   Title Increase Berg Balance Assessment score to 40/56   Status On-going             Long Term Clinic Goals - 08/27/14 1157    CC Long Term Goal  #1   Title independence in upgraded  home exercise program to include balance activities   Status On-going   CC Long Term Goal  #2   Title Increase Berg Balance Assessment score to 40/56   Baseline 33/56   Status On-going   CC Long Term Goal  #5   Title pt will report that she is familiar with exercise equipment and ready to transition to [employer] progam   pt not a candidate for [employer] program   Status Not Met            Plan - 08/27/14 1143    Clinical Impression Statement pt reports she has much less pain after exercise in back and shoulder.but continues to have decreased exercise tolerance with dyspnea, fatigue and increased reports of pain at end of 6 minute walk   Clinical Impairments Affecting Rehab Potential medical issues with parathyroid and coumadin regulation, arthritis in knees and back, effects of cancer surgery and radiation.   PT Next Visit Plan  continue with advanced gait techniques at parallel bars for support , mat work for stretch and strengthening        Problem List Patient Active Problem List   Diagnosis Date Noted  . Vitamin D deficiency 04/23/2014  . Hyperparathyroidism 04/23/2014  . Depression 07/18/2013  . Breast cancer of upper-outer quadrant of right female breast 07/15/2013  . Encounter for therapeutic drug monitoring 07/05/2013  . Overactive bladder 01/30/2013  . DVT (deep venous thrombosis), left 12/19/2012  . HTN (hypertension) 09/27/2011  . Hearing loss 09/27/2011  . Chest wall pain 09/27/2011  . Seasonal allergies 09/27/2011  . SOB (shortness of breath), related to deconditioning 08/26/2011  . Hypothyroid 08/26/2011   [name]. Owens Shark, PT  08/27/2014, 11:59 AM  [place], [name], [address] Phone: [phone]   Fax:  [phone]

## 2014-08-27 NOTE — Progress Notes (Signed)
[facility]  [phone] [phone]     ID: Alicia Vasquez OB: 03/06/1941  MR#: [id number]  YKD#:[id number]  PCP: [name], MD GYN:   SU: [name] OTHER MD: Thea Silversmith, Hart Robinsons  CHIEF COMPLAINT: Estrogen receptor positive breast cancer CURRENT TREATMENT: To start anastrozole  BREAST CANCER HISTORY: From doctor Alicia Vasquez's intake node 07/24/2013:  "74 y.o. female. Who presented with SOB and had a CT chest performed that revealed a right breast mass. Mammogram/ultrsound on 2/13 showed a mass in the 11 o'clock position in the right breast measuring 1.5 cm. Also noted was a right axillary LN measuring 1.6 cm. MRI not performed. Biopsy of mass and lymph done. 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 Lymph node + for metastatic carcinoma."  [On 09/09/2013 the patient underwent right lumpectomy and sentinel lymph node sampling. This showed ([id number]) multifocal invasive ductal carcinoma, grade 1, the largest lesion measuring 1.8 cm, the second lesion 1.2 cm. One of 4 sentinel lymph nodes was positive, with extracapsular extension. Margins were positive. HER-2 was repeated and was again negative. Further surgery 09/16/2013 obtained clear margins.  Her subsequent history is as detailed below  INTERVAL HISTORY: Alicia Vasquez returns today for follow up of her breast cancer. At her last visit, she was started on anastrozole, but stopped it not even 3 weeks later because she began to experience swelling to her right arm. She believed this to be related to the medicine, or was told that, she cant remember. 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  REVIEW OF SYSTEMS: Alicia Vasquez denies fevers, chills, nausea, vomiting, or changes in bowel or bladder habits. 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 She has numbness from her right axilla to her right elbow, and always feels a fullness/swelling to her right chest. The breast is sore and achy intermittently. She denies headaches, dizziness, or weakness, but she does experience some hearing loss. A detailed review of systems is otherwise stable.   PAST MEDICAL HISTORY: Past Medical History  Diagnosis Date  . Allergy   . Hypertension   . Hypothyroid   . Blood transfusion without reported diagnosis   . Pneumonia   . DVT (deep vein thrombosis) in pregnancy   . Breast cancer 07/12/13    invasive mammary carcinoma  . Anxiety   . Arthritis   . Radiation 11/21/13-01/07/14    Right Breast/supraclav.    PAST SURGICAL HISTORY: Past Surgical History  Procedure Laterality Date  . Removal of teeth  04/19/2012    13 teeth removed  . Biospy      female organs  . Dilation and curettage of uterus    . Breast lumpectomy with radioactive seed localization Right 09/09/2013    Procedure: BREAST LUMPECTOMY WITH RADIOACTIVE SEED LOCALIZATION WITH AXILLARY NODE EXCISION;  Surgeon: [name], MD;  Location: [facility];  Service: General;  Laterality: Right;  . Re-excision of breast lumpectomy Right 09/24/2013    Procedure: RE-EXCISION OF RIGHT BREAST LUMPECTOMY;  Surgeon: [name], MD;  Location: [facility];  Service: General;  Laterality: Right;    FAMILY HISTORY Family History  Problem Relation  Age of Onset  . Cancer Brother     prostate  . Heart disease Brother   . Colon cancer Brother    the patient's father died at the age of 67 after an automobile accident. The patient's mother died at the age of 42. She was a Marine scientist here in [name] in the old [facility]. She was  infected with polio and was confined to a wheelchair for a good part of her life. She eventually died of pneumonia. The patient had one brother, who died with prostate cancer. She had no sisters. There is no history of breast or ovarian cancer in the family.  GYNECOLOGIC HISTORY:  Menarche age 37, first live birth age 9, the patient is GX P1. She went through the change of life at age 9. 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 She is widowed. Currently she is staying with her friend Alicia Vasquez, who is a retired Radio producer. The patient's son Alicia Vasquez lives in [place]. He works an Engineer, technical sales. The patient has no grandchildren. She is a Tourist information centre manager but currently attends a [employer] with her friend Alicia Vasquez    ADVANCED DIRECTIVES:    HEALTH MAINTENANCE: History  Substance Use Topics  . Smoking status: Never Smoker   . Smokeless tobacco: Never Used  . Alcohol Use: No     Colonoscopy: Never  PAP:  Bone density: 10/01/2009; lowest T score -0.8  Lipid panel:  Allergies  Allergen Reactions  . Anesthetics, Amide Other (See Comments)    ELEVATE BLOOD PRESSURE  . Benadryl [Diphenhydramine Hcl]     dizziness  . Carbocaine [Mepivacaine Hcl] Other (See Comments)    Elevated blood pressure  . Codeine Nausea Only and Other (See Comments)    dizziness  . Epinephrine Other (See Comments)    Elevated blood pressure  . Sulfa Antibiotics Other (See Comments)    dizziness  . Vicodin [Hydrocodone-Acetaminophen] Nausea Only  . Penicillins Rash    Current Outpatient Prescriptions  Medication Sig Dispense Refill  . anastrozole (ARIMIDEX) 1 MG tablet Take 1 tablet (1 mg total) by mouth daily. 90 tablet 4  . Cholecalciferol (VITAMIN D3) 5000 UNITS TABS Take by mouth.    . levothyroxine (SYNTHROID) 125 MCG tablet Take 125 mcg by mouth daily before breakfast. Dispense as written: Synthroid    . lisinopril  (PRINIVIL,ZESTRIL) 10 MG tablet Take 1 tablet (10 mg total) by mouth daily. 30 tablet 11  . non-metallic deodorant (ALRA) MISC Apply 1 application topically daily as needed.    Marland Kitchen oxyCODONE (OXY IR/ROXICODONE) 5 MG immediate release tablet Take 1 tablet (5 mg total) by mouth every 6 (six) hours as needed. 30 tablet 0  . warfarin (COUMADIN) 5 MG tablet TAKE AS DIRECTED BY ANTICOAGULATION CLINIC 60 tablet 3  . acetaminophen (TYLENOL) 500 MG tablet Take 500 mg by mouth every 6 (six) hours as needed for mild pain, fever or headache.    Marland Kitchen HYDROcodone-homatropine (HYCODAN) 5-1.5 MG/5ML syrup Take 5 mLs by mouth every 8 (eight) hours as needed for cough. (Patient not taking: Reported on 08/27/2014) 120 mL 0  . montelukast (SINGULAIR) 10 MG tablet Take 1 tablet (10 mg total) by mouth at bedtime. (Patient not taking: Reported on 08/27/2014) 30 tablet 3   No current facility-administered medications for this visit.    OBJECTIVE: Older white woman who appears stated age 22 Vitals:   08/27/14 1301  BP: 141/50  Pulse: 66  Temp: 97.5 F (36.4 C)  Resp: 18     Body mass index is 39.12 kg/(m^2).    ECOG FS:1 - Symptomatic but completely ambulatory  Skin: warm, dry  HEENT: sclerae anicteric, conjunctivae pink, oropharynx clear. No thrush or mucositis.  Lymph Nodes: No cervical or supraclavicular lymphadenopathy  Lungs: clear to auscultation bilaterally, no rales, wheezes, or rhonci  Heart: regular rate and rhythm  Abdomen: round, soft, non tender, positive bowel sounds  Musculoskeletal: No focal spinal tenderness, no peripheral edema  Neuro: non focal, well oriented, positive affect  Breasts: right breast status post lumpectomy and radiation. No evidence of recurrent disease. Right axilla benign. Left breast unremarkable.  LAB RESULTS:  CMP     Component Value Date/Time   NA 141 08/27/2014 1234   NA 137 07/27/2014 1257   K 4.6 08/27/2014 1234   K 4.1 07/27/2014 1257   CL 102 07/27/2014 1257    CO2 24 08/27/2014 1234   CO2 26 07/27/2014 1257   GLUCOSE 110 08/27/2014 1234   GLUCOSE 100* 07/27/2014 1257   BUN 12.5 08/27/2014 1234   BUN 12 07/27/2014 1257   CREATININE 0.7 08/27/2014 1234   CREATININE 0.75 07/27/2014 1257   CREATININE 0.77 09/05/2013 1330   CALCIUM 10.1 08/27/2014 1234   CALCIUM 10.2 07/27/2014 1257   CALCIUM 10.1 07/27/2014 1257   PROT 6.7 08/27/2014 1234   PROT 7.2 07/27/2014 1257   ALBUMIN 3.8 08/27/2014 1234   ALBUMIN 4.3 07/27/2014 1257   AST 18 08/27/2014 1234   AST 17 07/27/2014 1257   ALT 24 08/27/2014 1234   ALT 23 07/27/2014 1257   ALKPHOS 76 08/27/2014 1234   ALKPHOS 79 07/27/2014 1257   BILITOT 0.36 08/27/2014 1234   BILITOT 0.5 07/27/2014 1257   GFRNONAA 86 04/14/2014 1126   GFRNONAA 81* 09/05/2013 1330   GFRAA >89 04/14/2014 1126   GFRAA >90 09/05/2013 1330    I No results found for: SPEP  Lab Results  Component Value Date   WBC 5.3 08/27/2014   NEUTROABS 3.1 08/27/2014   HGB 13.8 08/27/2014   HCT 43.0 08/27/2014   MCV 92.3 08/27/2014   PLT 227 08/27/2014      Chemistry      Component Value Date/Time   NA 141 08/27/2014 1234   NA 137 07/27/2014 1257   K 4.6 08/27/2014 1234   K 4.1 07/27/2014 1257   CL 102 07/27/2014 1257   CO2 24 08/27/2014 1234   CO2 26 07/27/2014 1257   BUN 12.5 08/27/2014 1234   BUN 12 07/27/2014 1257   CREATININE 0.7 08/27/2014 1234   CREATININE 0.75 07/27/2014 1257   CREATININE 0.77 09/05/2013 1330      Component Value Date/Time   CALCIUM 10.1 08/27/2014 1234   CALCIUM 10.2 07/27/2014 1257   CALCIUM 10.1 07/27/2014 1257   ALKPHOS 76 08/27/2014 1234   ALKPHOS 79 07/27/2014 1257   AST 18 08/27/2014 1234   AST 17 07/27/2014 1257   ALT 24 08/27/2014 1234   ALT 23 07/27/2014 1257   BILITOT 0.36 08/27/2014 1234   BILITOT 0.5 07/27/2014 1257       No results found for: LABCA2  No components found for: ACZYS063  No results for input(s): INR in the last 168 hours.  Urinalysis      Component Value Date/Time   BILIRUBINUR neg 08/07/2014 1454   PROTEINUR neg 08/07/2014 1454   UROBILINOGEN 0.2 08/07/2014 1454   NITRITE neg 08/07/2014 1454   LEUKOCYTESUR Trace 08/07/2014 1454  STUDIES: No results found.  ASSESSMENT: 74 y.o. [place] woman status post right lumpectomy and sentinel lymph node sampling 09/09/2013 for an mpT1c pN1a, stage IIA invasive ductal carcinoma, estrogen and progesterone receptor both 100% positive with strong staining intensity, MIB-1 of 17% and no HER-2 amplification  (1) additional surgery for margin clearance 09/16/2013 obtained negative margins  (2) Oncotype DX recurrence score of 4 predicts a risk of outside the breast recurrence within 10 years of 7% if the patient's only systemic therapy is tamoxifen for 5 years. It also predicts no benefit from chemotherapy  (3) additional right breast surgery for margin clearance was successful, 09/16/2048  (4) adjuvant radiation completed 01/07/2014  (5) anastrozole started 02/27/2014 stopped within 2 weeks because of arm swelling. To restart on 08/29/14; bone density may 2011 was normal  PLAN: Alicia Vasquez is doing well as far as her breast cancer is concerned. The labs were reviewed in detail and were entirely stable. At this time I am having her restart the anastrozole, since she was only on it for 2 weeks last Fall. Her right arm swelling was likely due to mild lymphedema at the time, and no longer present today. Should any particularly worrisome symptoms appear, she will call the triage line and stop the drug.   In the meantime, I have placed orders for a repeat mammogram and bone density scan for next month.   Alicia Vasquez will return in 3 months for labs and a follow up visit. She understands and agrees with this plan. She knows the goal of treatment in her case is cure. She has been encouraged to call with any issues that might arise before her next visit here.  Laurie Panda, NP   08/27/2014 3:11 PM

## 2014-08-27 NOTE — Telephone Encounter (Signed)
Appointments made and avs printed for patient  Alicia Vasquez

## 2014-08-28 ENCOUNTER — Other Ambulatory Visit: Payer: Self-pay | Admitting: Nurse Practitioner

## 2014-08-28 ENCOUNTER — Encounter: Payer: Self-pay | Admitting: Nurse Practitioner

## 2014-08-28 ENCOUNTER — Ambulatory Visit (HOSPITAL_BASED_OUTPATIENT_CLINIC_OR_DEPARTMENT_OTHER): Payer: Medicare Other | Admitting: Nurse Practitioner

## 2014-08-28 DIAGNOSIS — R609 Edema, unspecified: Secondary | ICD-10-CM

## 2014-08-28 DIAGNOSIS — C50411 Malignant neoplasm of upper-outer quadrant of right female breast: Secondary | ICD-10-CM | POA: Diagnosis not present

## 2014-08-28 DIAGNOSIS — R600 Localized edema: Secondary | ICD-10-CM | POA: Insufficient documentation

## 2014-08-28 DIAGNOSIS — Z86718 Personal history of other venous thrombosis and embolism: Secondary | ICD-10-CM | POA: Diagnosis not present

## 2014-08-28 DIAGNOSIS — Z7901 Long term (current) use of anticoagulants: Secondary | ICD-10-CM | POA: Diagnosis not present

## 2014-08-28 NOTE — Progress Notes (Signed)
1420  This nurse was in room with another patient, unable to perform collaboative assessment; Michel Harrow, NP assessed patient and pt discharged home.

## 2014-08-28 NOTE — Progress Notes (Signed)
[facility]EMENT [facility]   HPI: Alicia Vasquez 74 y.o. female diagnosed with breast cancer.  Patient is status post lumpectomy; is currently undergoing anastrozole therapy.    Patient initiated anastrozole therapy a few weeks ago; but discontinued the anastrozole after only approximately 2 weeks.  Patient developed some right upper extremity lymphedema; and associated the lymphedema with the anastrozole.  Patient was instructed yesterday to restart the anastrozole.  Patient has been receiving physical therapy and lymph massage on a fairly regular basis for the last several weeks.  Patient does have some very mild right upper extremity lymphedema on exam.  Patient also reports some mild, intermittent numbness/tingling to her right upper arm as well since the lymphedema has occurred.  However, patient was not wearing a compression sleeve during exam today.  Patient received a blood draw to her left antecubital space just yesterday.  She has a tiny bruise at the lab draw insertion site.  She has also noticed a large mass a few centimeters away from the lab draw insertion site to her left arm.  She is complaining of some mild tenderness to the site.  Patient denies any chest pain, chest pressure, shortness breath, or pain with inspiration.  She also denies any recent fevers or chills.  HPI  ROS  Past Medical History  Diagnosis Date  . Allergy   . Hypertension   . Hypothyroid   . Blood transfusion without reported diagnosis   . Pneumonia   . DVT (deep vein thrombosis) in pregnancy   . Breast cancer 07/12/13    invasive mammary carcinoma  . Anxiety   . Arthritis   . Radiation 11/21/13-01/07/14    Right Breast/supraclav.    Past Surgical History  Procedure Laterality Date  . Removal of teeth  04/19/2012    13 teeth removed  . Biospy      female organs  . Dilation and curettage of uterus    . Breast lumpectomy with radioactive seed localization Right 09/09/2013    Procedure:  BREAST LUMPECTOMY WITH RADIOACTIVE SEED LOCALIZATION WITH AXILLARY NODE EXCISION;  Surgeon: [name], MD;  Location: [facility];  Service: General;  Laterality: Right;  . Re-excision of breast lumpectomy Right 09/24/2013    Procedure: RE-EXCISION OF RIGHT BREAST LUMPECTOMY;  Surgeon: [name], MD;  Location: [facility];  Service: General;  Laterality: Right;    has SOB (shortness of breath), related to deconditioning; Hypothyroid; HTN (hypertension); Hearing loss; Chest wall pain; Seasonal allergies; DVT (deep venous thrombosis), left; Overactive bladder; Encounter for therapeutic drug monitoring; Breast cancer of upper-outer quadrant of right female breast; Depression; Vitamin D deficiency; Hyperparathyroidism; and Arm edema on her problem list.    is allergic to anesthetics, amide; benadryl; carbocaine; codeine; epinephrine; sulfa antibiotics; vicodin; and penicillins.    Medication List       This list is accurate as of: 08/28/14  4:36 PM.  Always use your most recent med list.               acetaminophen 500 MG tablet  Commonly known as:  TYLENOL  Take 500 mg by mouth every 6 (six) hours as needed for mild pain, fever or headache.     anastrozole 1 MG tablet  Commonly known as:  ARIMIDEX  Take 1 tablet (1 mg total) by mouth daily.     HYDROcodone-homatropine 5-1.5 MG/5ML syrup  Commonly known as:  HYCODAN  Take 5 mLs by mouth every 8 (eight) hours as needed  for cough.     lisinopril 10 MG tablet  Commonly known as:  PRINIVIL,ZESTRIL  Take 1 tablet (10 mg total) by mouth daily.     montelukast 10 MG tablet  Commonly known as:  SINGULAIR  Take 1 tablet (10 mg total) by mouth at bedtime.     non-metallic deodorant Misc  Commonly known as:  ALRA  Apply 1 application topically daily as needed.     oxyCODONE 5 MG immediate release tablet  Commonly known as:  Oxy IR/ROXICODONE  Take 1 tablet (5 mg total) by mouth every 6 (six)  hours as needed.     SYNTHROID 125 MCG tablet  Generic drug:  levothyroxine  Take 125 mcg by mouth daily before breakfast. Dispense as written: Synthroid     Vitamin D3 5000 UNITS Tabs  Take by mouth.     warfarin 5 MG tablet  Commonly known as:  COUMADIN  TAKE AS DIRECTED BY ANTICOAGULATION CLINIC         PHYSICAL EXAMINATION  Oncology Vitals 08/27/2014 08/07/2014 07/27/2014 07/10/2014 06/05/2014 05/01/2014 04/18/2014  Height 163 cm 163 cm 163 cm 164 cm 161 cm 161 cm 163 cm  Weight 103.42 kg 103.239 kg 104.497 kg 102.967 kg 103.42 kg 103.692 kg 105.235 kg  Weight (lbs) 228 lbs 227 lbs 10 oz 230 lbs 6 oz 227 lbs 228 lbs 228 lbs 10 oz 232 lbs  BMI (kg/m2) 39.14 kg/m2 39.07 kg/m2 39.54 kg/m2 38.36 kg/m2 39.75 kg/m2 39.86 kg/m2 39.82 kg/m2  Temp 97.5 97.4 98.3 98 97.6 97.9 97.6  Pulse 66 77 91 78 79 76 91  Resp '18 16 16 16 16 16 12  ' SpO2 - 96 96 96 95 96 96  BSA (m2) 2.16 m2 2.16 m2 2.17 m2 2.17 m2 2.15 m2 2.16 m2 2.18 m2   BP Readings from Last 3 Encounters:  08/27/14 141/50  08/07/14 138/70  07/27/14 130/80    Physical Exam  Constitutional: She is oriented to person, place, and time and well-developed, well-nourished, and in no distress.  HENT:  Head: Normocephalic and atraumatic.  Eyes: Conjunctivae and EOM are normal. Pupils are equal, round, and reactive to light. Right eye exhibits no discharge. Left eye exhibits no discharge. No scleral icterus.  Neck: Normal range of motion.  Pulmonary/Chest: Effort normal. No respiratory distress.  Musculoskeletal: Normal range of motion. She exhibits edema and tenderness.  Patient has some very mild right upper extremity lymphedema; but appears to have full range of motion with her arm.  Left antecubital region with mild bruise directly below lab draw insertion site.  Patient has approximately 4-5 cm in diameter large semi--firm mass to the posterior left antecubital region.  Patient is complaining of some mild tenderness with deep  palpation to the site.  There is no erythema, warmth, or red streaks.  Neurological: She is alert and oriented to person, place, and time. Gait normal.  Skin: Skin is warm and dry. No rash noted. No erythema. No pallor.  See previous note regarding left upper extremity.  Psychiatric: Affect normal.  Nursing note and vitals reviewed.   LABORATORY DATA:. Appointment on 08/27/2014  Component Date Value Ref Range Status  . WBC 08/27/2014 5.3  3.9 - 10.3 10e3/uL Final  . NEUT# 08/27/2014 3.1  1.5 - 6.5 10e3/uL Final  . HGB 08/27/2014 13.8  11.6 - 15.9 g/dL Final  . HCT 08/27/2014 43.0  34.8 - 46.6 % Final  . Platelets 08/27/2014 227  145 - 400 10e3/uL Final  .  MCV 08/27/2014 92.3  79.5 - 101.0 fL Final  . MCH 08/27/2014 29.7  25.1 - 34.0 pg Final  . MCHC 08/27/2014 32.2  31.5 - 36.0 g/dL Final  . RBC 08/27/2014 4.66  3.70 - 5.45 10e6/uL Final  . RDW 08/27/2014 14.3  11.2 - 14.5 % Final  . lymph# 08/27/2014 1.1  0.9 - 3.3 10e3/uL Final  . MONO# 08/27/2014 0.8  0.1 - 0.9 10e3/uL Final  . Eosinophils Absolute 08/27/2014 0.2  0.0 - 0.5 10e3/uL Final  . Basophils Absolute 08/27/2014 0.1  0.0 - 0.1 10e3/uL Final  . NEUT% 08/27/2014 58.4  38.4 - 76.8 % Final  . LYMPH% 08/27/2014 20.8  14.0 - 49.7 % Final  . MONO% 08/27/2014 14.9* 0.0 - 14.0 % Final  . EOS% 08/27/2014 4.6  0.0 - 7.0 % Final  . BASO% 08/27/2014 1.3  0.0 - 2.0 % Final  . Sodium 08/27/2014 141  136 - 145 mEq/L Final  . Potassium 08/27/2014 4.6  3.5 - 5.1 mEq/L Final  . Chloride 08/27/2014 105  98 - 109 mEq/L Final  . CO2 08/27/2014 24  22 - 29 mEq/L Final  . Glucose 08/27/2014 110  70 - 140 mg/dl Final  . BUN 08/27/2014 12.5  7.0 - 26.0 mg/dL Final  . Creatinine 08/27/2014 0.7  0.6 - 1.1 mg/dL Final  . Total Bilirubin 08/27/2014 0.36  0.20 - 1.20 mg/dL Final  . Alkaline Phosphatase 08/27/2014 76  40 - 150 U/L Final  . AST 08/27/2014 18  5 - 34 U/L Final  . ALT 08/27/2014 24  0 - 55 U/L Final  . Total Protein 08/27/2014 6.7   6.4 - 8.3 g/dL Final  . Albumin 08/27/2014 3.8  3.5 - 5.0 g/dL Final  . Calcium 08/27/2014 10.1  8.4 - 10.4 mg/dL Final  . Anion Gap 08/27/2014 12* 3 - 11 mEq/L Final  . EGFR 08/27/2014 79* >90 ml/min/1.73 m2 Final   eGFR is calculated using the CKD-EPI Creatinine Equation (2009)     RADIOGRAPHIC STUDIES: No results found.  ASSESSMENT/PLAN:    Breast cancer of upper-outer quadrant of right female breast Patient initiated anastrozole therapy a few weeks ago; but discontinued the anastrozole after only approximately 2 weeks.  Patient developed some right upper extremity lymphedema; and associated the lymphedema with the anastrozole.  Patient was instructed yesterday to restart the anastrozole.  Patient has been receiving physical therapy and lymph massage on a fairly regular basis for the last several weeks.  Patient does have some very mild right upper extremity lymphedema on exam.  Patient also reports some mild, intermittent numbness/tingling to her right upper arm as well since the lymphedema has occurred.  However, patient was not wearing a compression sleeve during exam today.  Patient is scheduled for a mammogram and a bone density test on 09/03/2014.  Patient will return to the [facility] for labs and a follow-up visit on 11/27/2014.  She knows to call in the interim with any new worries or concerns.   Arm edema Patient received a blood draw to her left antecubital space just just today.  She has a tiny bruise at the lab draw insertion site.  She has also noticed a large mass a few centimeters away from the lab draw insertion site to her left arm.  She is complaining of some mild tenderness to the site.  On exam-patient does have an approximately 3-4 cm in diameter right-firm mass to posterior left antecubital area.  There is no erythema,  warmth, or red streaks to the site.  Has tiny bruise directly underneath the lab draw insertion site.  Patient was afebrile on exam  today.  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  She states she has missed no doses of her Coumadin recently.  Have arranged for patient to have a left upper extremity Doppler ultrasound tomorrow morning 08/29/2014 at 10 AM.  Advised patient would call to review all results with her tomorrow following the ultrasound.  Patient was advised to call/return to go directly to the emergency department for any new or worsening symptoms whatsoever.   Patient stated understanding of all instructions; and was in agreement with this plan of care. The patient knows to call the clinic with any problems, questions or concerns.   Review/collaboration with Dr. Jana Hakim regarding all aspects of patient's visit today.   Total time spent with patient was 40 minutes;  with greater than 75 percent of that time spent in face to face counseling regarding patient's symptoms,  and coordination of care and follow up.  Disclaimer: This note was dictated with voice recognition software. Similar sounding words can inadvertently be transcribed and may not be corrected upon review.   Drue Second, NP 08/28/2014

## 2014-08-28 NOTE — Assessment & Plan Note (Signed)
Patient received a blood draw to her left antecubital space just just today.  She has a tiny bruise at the lab draw insertion site.  She has also noticed a large mass a few centimeters away from the lab draw insertion site to her left arm.  She is complaining of some mild tenderness to the site.  On exam-patient does have an approximately 3-4 cm in diameter right-firm mass to posterior left antecubital area.  There is no erythema, warmth, or red streaks to the site.  Has tiny bruise directly underneath the lab draw insertion site.  Patient was afebrile on exam today.  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  She states she has missed no doses of her Coumadin recently.  Have arranged for patient to have a left upper extremity Doppler ultrasound tomorrow morning 08/29/2014 at 10 AM.  Advised patient would call to review all results with her tomorrow following the ultrasound.  Patient was advised to call/return to go directly to the emergency department for any new or worsening symptoms whatsoever.

## 2014-08-28 NOTE — Assessment & Plan Note (Signed)
Patient initiated anastrozole therapy a few weeks ago; but discontinued the anastrozole after only approximately 2 weeks.  Patient developed some right upper extremity lymphedema; and associated the lymphedema with the anastrozole.  Patient was instructed yesterday to restart the anastrozole.  Patient has been receiving physical therapy and lymph massage on a fairly regular basis for the last several weeks.  Patient does have some very mild right upper extremity lymphedema on exam.  Patient also reports some mild, intermittent numbness/tingling to her right upper arm as well since the lymphedema has occurred.  However, patient was not wearing a compression sleeve during exam today.  Patient is scheduled for a mammogram and a bone density test on 09/03/2014.  [facility] and a follow-up visit on 11/27/2014.  She knows to call in the interim with any new worries or concerns.

## 2014-08-29 ENCOUNTER — Other Ambulatory Visit: Payer: Self-pay | Admitting: *Deleted

## 2014-08-29 ENCOUNTER — Telehealth: Payer: Self-pay | Admitting: Nurse Practitioner

## 2014-08-29 ENCOUNTER — Ambulatory Visit (HOSPITAL_COMMUNITY)
Admission: RE | Admit: 2014-08-29 | Discharge: 2014-08-29 | Disposition: A | Discharge status: Home / Self Care | Payer: Medicare Other | Source: Ambulatory Visit | Attending: Nurse Practitioner | Admitting: Nurse Practitioner

## 2014-08-29 DIAGNOSIS — C50411 Malignant neoplasm of upper-outer quadrant of right female breast: Secondary | ICD-10-CM

## 2014-08-29 DIAGNOSIS — R609 Edema, unspecified: Secondary | ICD-10-CM | POA: Diagnosis not present

## 2014-08-29 DIAGNOSIS — R600 Localized edema: Secondary | ICD-10-CM

## 2014-08-29 NOTE — Progress Notes (Signed)
*  PRELIMINARY RESULTS* Vascular Ultrasound Left upper extremity venous duplex has been completed.  Preliminary findings: Negative for DVT and superficial thrombosis.  Called results to [facility].   Landry Mellow, RDMS, RVT  08/29/2014, 10:46 AM

## 2014-08-29 NOTE — Telephone Encounter (Signed)
Patient presents to the cancer center just yesterday with complaint of left upper extremity edema and mild tenderness near her lab draw insertion site.  Doppler ultrasound obtained this morning was negative for DVT or superficial thrombus.  Patient continues on Coumadin for previous diagnosis of DVT.  Advised patient to elevate arm above the level of for heart; he use warm compresses to the site on an as-needed basis.  Also advised patient to go to the emergency department over the weekend she develops any worsening symptoms whatsoever.  Patient stated understanding of all instructions and agreed with this plan care.

## 2014-09-01 ENCOUNTER — Other Ambulatory Visit: Payer: Self-pay | Admitting: *Deleted

## 2014-09-01 DIAGNOSIS — C50911 Malignant neoplasm of unspecified site of right female breast: Secondary | ICD-10-CM

## 2014-09-01 DIAGNOSIS — Z1239 Encounter for other screening for malignant neoplasm of breast: Secondary | ICD-10-CM

## 2014-09-02 ENCOUNTER — Ambulatory Visit: Payer: Medicare Other | Attending: Oncology | Admitting: Physical Therapy

## 2014-09-02 DIAGNOSIS — R5381 Other malaise: Secondary | ICD-10-CM

## 2014-09-02 DIAGNOSIS — Z9889 Other specified postprocedural states: Secondary | ICD-10-CM | POA: Insufficient documentation

## 2014-09-02 DIAGNOSIS — M25511 Pain in right shoulder: Secondary | ICD-10-CM

## 2014-09-02 DIAGNOSIS — M24511 Contracture, right shoulder: Secondary | ICD-10-CM | POA: Diagnosis not present

## 2014-09-02 DIAGNOSIS — I890 Lymphedema, not elsewhere classified: Secondary | ICD-10-CM | POA: Insufficient documentation

## 2014-09-02 DIAGNOSIS — C50911 Malignant neoplasm of unspecified site of right female breast: Secondary | ICD-10-CM | POA: Diagnosis not present

## 2014-09-02 DIAGNOSIS — M545 Low back pain: Secondary | ICD-10-CM

## 2014-09-02 DIAGNOSIS — M25562 Pain in left knee: Secondary | ICD-10-CM

## 2014-09-02 DIAGNOSIS — M542 Cervicalgia: Secondary | ICD-10-CM | POA: Insufficient documentation

## 2014-09-02 DIAGNOSIS — M549 Dorsalgia, unspecified: Secondary | ICD-10-CM | POA: Diagnosis not present

## 2014-09-02 DIAGNOSIS — M25561 Pain in right knee: Secondary | ICD-10-CM

## 2014-09-02 NOTE — Therapy (Signed)
[place], [name], [address] Phone: [phone]   Fax:  [phone]  Physical Therapy Treatment  Patient Details  Name: Alicia Vasquez MRN: [id number] Date of Birth: 1940-12-10 Referring Provider:  [name], MD  Encounter Date: 09/02/2014      PT End of Session - 09/02/14 1430    Visit Number 45   Number of Visits 41   Date for PT Re-Evaluation 10/10/14   PT Start Time 4332   PT Stop Time 1430   PT Time Calculation (min) 45 min      Past Medical History  Diagnosis Date  . Allergy   . Hypertension   . Hypothyroid   . Blood transfusion without reported diagnosis   . Pneumonia   . DVT (deep vein thrombosis) in pregnancy   . Breast cancer 07/12/13    invasive mammary carcinoma  . Anxiety   . Arthritis   . Radiation 11/21/13-01/07/14    Right Breast/supraclav.    Past Surgical History  Procedure Laterality Date  . Removal of teeth  04/19/2012    13 teeth removed  . Biospy      female organs  . Dilation and curettage of uterus    . Breast lumpectomy with radioactive seed localization Right 09/09/2013    Procedure: BREAST LUMPECTOMY WITH RADIOACTIVE SEED LOCALIZATION WITH AXILLARY NODE EXCISION;  Surgeon: [name], MD;  Location: [facility];  Service: General;  Laterality: Right;  . Re-excision of breast lumpectomy Right 09/24/2013    Procedure: RE-EXCISION OF RIGHT BREAST LUMPECTOMY;  Surgeon: [name], MD;  Location: [facility];  Service: General;  Laterality: Right;    There were no vitals filed for this visit.  Visit Diagnosis:  Physical deconditioning  Pain in joint, shoulder region, right  Bilateral low back pain, with sciatica presence unspecified  Arthralgia of both knees      Subjective Assessment - 09/02/14 1349    Subjective pt had some swelling in left arm after having blood drawn last wekk. She said she walked outside  twice yesterday and is feeling better.    Patient Stated Goals pt wants to be more active physically and to be able to walk a long distance without getting so short of breath.   Currently in Pain? Yes   Pain Score 3    Pain Location Back   Pain Score 3   Pain Location Knee   Pain Orientation Left                       [facility] Adult PT Treatment/Exercise - 09/02/14 0001    High Level Balance   High Level Balance Activities Side stepping;Backward walking;Direction changes;Turns;Sudden stops  focused on larger steps and changing speeds   High Level Balance Comments --   Neck Exercises: Seated   Cervical Rotation Both   Lateral Flexion Both;5 reps   Shoulder Shrugs 5 reps   Shoulder Rolls Backwards;5 reps   Shoulder Flexion 10 reps  with cane    Lumbar Exercises: Standing   Other Standing Lumbar Exercises standing  hip abduction    Other Standing Lumbar Exercises modified forward bending  for low back stretch   Lumbar Exercises: Supine   Ab Set --   Bent Knee Raise --   Bridge --   Other Supine Lumbar Exercises --   Other Supine Lumbar Exercises --   Knee/Hip Exercises: Stretches   Active Hamstring Stretch 5 reps  Knee/Hip Exercises: Aerobic   Stationary Bike no resistance. 10 minutes   recumbant bike, pain at first right knee top of rotation   Tread Mill --   Knee/Hip Exercises: Standing   Lateral Step Up Both;10 reps;Step Height: 4"   Knee/Hip Exercises: Seated   Other Seated Knee Exercises marches with trunk extension 10 reps on leg    Other Seated Knee Exercises green theraband hip flexion   Knee/Hip Exercises: Supine   Short Arc Quad Sets --  4#   Bridges --   Straight Leg Raises --  with leg in external rotation   Shoulder Exercises: Seated   Row Strengthening;12 reps   Theraband Level (Shoulder Row) Level 3 (Green)   External Rotation Strengthening;10 reps;Theraband   Theraband Level (Shoulder External Rotation) Level 3 (Green)   Flexion Both;10  reps  active stretching to include side bends    Shoulder Exercises: Pulleys   Flexion 2 minutes   ABduction 2 minutes   Ankle Exercises: Seated   Heel Raises 10 reps                   Short Term Clinic Goals - 09/02/14 1431    CC Short Term Goal  #1   Title pt will report pain in low back at 2/10   Status On-going   CC Short Term Goal  #2   Title Increase Berg Balance Assessment score to 40/56   Status On-going             Long Term Clinic Goals - 09/02/14 1432    CC Long Term Goal  #1   Title independence in upgraded  home exercise program to include balance activities   Status On-going   CC Long Term Goal  #2   Title Increase Berg Balance Assessment score to 40/56   Status On-going   CC Long Term Goal  #3   Title Quick Dash assessment will be less than 40    Status On-going   CC Long Term Goal  #4   Title verbalize a plan to continue with community based exercise program   Status On-going            Plan - 09/02/14 1430    Clinical Impression Statement pt as able to work with knee pain and was able to work on recumbant bike today. 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   PT Next Visit Plan  continue with advanced gait techniques at parallel bars for support , mat work for stretch and strengthening        Problem List Patient Active Problem List   Diagnosis Date Noted  . Arm edema 08/28/2014  . Vitamin D deficiency 04/23/2014  . Hyperparathyroidism 04/23/2014  . Depression 07/18/2013  . Breast cancer of upper-outer quadrant of right female breast 07/15/2013  . Encounter for therapeutic drug monitoring 07/05/2013  . Overactive bladder 01/30/2013  . DVT (deep venous thrombosis), left 12/19/2012  . HTN (hypertension) 09/27/2011  . Hearing loss 09/27/2011  . Chest  wall pain 09/27/2011  . Seasonal allergies 09/27/2011  . SOB (shortness of breath), related to deconditioning 08/26/2011  . Hypothyroid 08/26/2011   [name]. Owens Shark, PT 09/02/2014, 2:33 PM  [place], [name], [address] Phone: [phone]   Fax:  [phone]

## 2014-09-03 ENCOUNTER — Other Ambulatory Visit: Payer: Self-pay | Admitting: Nurse Practitioner

## 2014-09-03 ENCOUNTER — Other Ambulatory Visit: Payer: Medicare Other

## 2014-09-03 ENCOUNTER — Ambulatory Visit
Admission: RE | Admit: 2014-09-03 | Discharge: 2014-09-03 | Disposition: A | Discharge status: Home / Self Care | Payer: Medicare Other | Source: Ambulatory Visit | Attending: Nurse Practitioner | Admitting: Nurse Practitioner

## 2014-09-03 ENCOUNTER — Ambulatory Visit (INDEPENDENT_AMBULATORY_CARE_PROVIDER_SITE_OTHER): Payer: Medicare Other | Admitting: General Practice

## 2014-09-03 DIAGNOSIS — I82402 Acute embolism and thrombosis of unspecified deep veins of left lower extremity: Secondary | ICD-10-CM

## 2014-09-03 DIAGNOSIS — Z5181 Encounter for therapeutic drug level monitoring: Secondary | ICD-10-CM

## 2014-09-03 DIAGNOSIS — Z853 Personal history of malignant neoplasm of breast: Secondary | ICD-10-CM | POA: Diagnosis not present

## 2014-09-03 DIAGNOSIS — N6459 Other signs and symptoms in breast: Secondary | ICD-10-CM | POA: Diagnosis not present

## 2014-09-03 DIAGNOSIS — M8580 Other specified disorders of bone density and structure, unspecified site: Secondary | ICD-10-CM

## 2014-09-03 DIAGNOSIS — Z780 Asymptomatic menopausal state: Secondary | ICD-10-CM | POA: Diagnosis not present

## 2014-09-03 DIAGNOSIS — C50411 Malignant neoplasm of upper-outer quadrant of right female breast: Secondary | ICD-10-CM

## 2014-09-03 DIAGNOSIS — M85852 Other specified disorders of bone density and structure, left thigh: Secondary | ICD-10-CM | POA: Diagnosis not present

## 2014-09-03 LAB — POCT INR: INR: 3.4

## 2014-09-03 NOTE — Progress Notes (Signed)
Agree with plan 

## 2014-09-03 NOTE — Progress Notes (Signed)
Pre visit review using our clinic review tool, if applicable. No additional management support is needed unless otherwise documented below in the visit note. 

## 2014-09-04 ENCOUNTER — Ambulatory Visit: Payer: Medicare Other | Admitting: Physical Therapy

## 2014-09-04 DIAGNOSIS — M25561 Pain in right knee: Secondary | ICD-10-CM

## 2014-09-04 DIAGNOSIS — I890 Lymphedema, not elsewhere classified: Secondary | ICD-10-CM | POA: Diagnosis not present

## 2014-09-04 DIAGNOSIS — M542 Cervicalgia: Secondary | ICD-10-CM | POA: Diagnosis not present

## 2014-09-04 DIAGNOSIS — M25511 Pain in right shoulder: Secondary | ICD-10-CM

## 2014-09-04 DIAGNOSIS — M25562 Pain in left knee: Secondary | ICD-10-CM

## 2014-09-04 DIAGNOSIS — R5381 Other malaise: Secondary | ICD-10-CM

## 2014-09-04 DIAGNOSIS — Z9889 Other specified postprocedural states: Secondary | ICD-10-CM | POA: Diagnosis not present

## 2014-09-04 DIAGNOSIS — M545 Low back pain: Secondary | ICD-10-CM

## 2014-09-04 DIAGNOSIS — M549 Dorsalgia, unspecified: Secondary | ICD-10-CM | POA: Diagnosis not present

## 2014-09-04 DIAGNOSIS — M24511 Contracture, right shoulder: Secondary | ICD-10-CM | POA: Diagnosis not present

## 2014-09-04 DIAGNOSIS — C50911 Malignant neoplasm of unspecified site of right female breast: Secondary | ICD-10-CM | POA: Diagnosis not present

## 2014-09-04 NOTE — Therapy (Signed)
[place], [name], [address] Phone: [phone]   Fax:  [phone]  Physical Therapy Treatment  Patient Details  Name: Alicia Vasquez MRN: [id number] Date of Birth: [birth year] Referring Provider:  [name], MD  Encounter Date: 09/04/2014      PT End of Session - 09/04/14 1419    Visit Number 16   Number of Visits 81   Date for PT Re-Evaluation 10/10/14   PT Start Time 9983   PT Stop Time 1425   PT Time Calculation (min) 40 min      Past Medical History  Diagnosis Date  . Allergy   . Hypertension   . Hypothyroid   . Blood transfusion without reported diagnosis   . Pneumonia   . DVT (deep vein thrombosis) in pregnancy   . Breast cancer 07/12/13    invasive mammary carcinoma  . Anxiety   . Arthritis   . Radiation 11/21/13-01/07/14    Right Breast/supraclav.    Past Surgical History  Procedure Laterality Date  . Removal of teeth  04/19/2012    13 teeth removed  . Biospy      female organs  . Dilation and curettage of uterus    . Breast lumpectomy with radioactive seed localization Right 09/09/2013    Procedure: BREAST LUMPECTOMY WITH RADIOACTIVE SEED LOCALIZATION WITH AXILLARY NODE EXCISION;  Surgeon: [name], MD;  Location: [facility];  Service: General;  Laterality: Right;  . Re-excision of breast lumpectomy Right 09/24/2013    Procedure: RE-EXCISION OF RIGHT BREAST LUMPECTOMY;  Surgeon: [name], MD;  Location: [facility];  Service: General;  Laterality: Right;    There were no vitals filed for this visit.  Visit Diagnosis:  Physical deconditioning  Pain in joint, shoulder region, right  Bilateral low back pain, with sciatica presence unspecified  Arthralgia of both knees      Subjective Assessment - 09/04/14 1355    Subjective pt has mammogram, sonagram, and bone density test yesterday "They said everything in OK "     Currently in Pain? Yes                       [facility] Adult PT Treatment/Exercise - 09/04/14 0001    High Level Balance   High Level Balance Activities Side stepping;Backward walking;Direction changes;Turns;Sudden stops  focused on larger steps and changing speeds   Elbow Exercises   Elbow Flexion Strengthening;20 reps;Standing  2#   Neck Exercises: Seated   Cervical Rotation Both   Lateral Flexion Both;5 reps   Shoulder Shrugs 5 reps   Shoulder Rolls Backwards;5 reps   Shoulder Flexion 10 reps  with cane    Lumbar Exercises: Standing   Other Standing Lumbar Exercises standing  hip abduction    Knee/Hip Exercises: Aerobic   Stationary Bike 10 minutes at level 2  difficulty keeping speed up on bike, needed rest breaks    Knee/Hip Exercises: Seated   Long Arc Quad 10 reps;AAROM;Both   Other Seated Knee Exercises marches with trunk extension 10 reps on leg    Other Seated Knee Exercises hip abduction with green theraband around knees    Shoulder Exercises: Seated   Row Strengthening;12 reps   Theraband Level (Shoulder Row) Level 3 (Green)   External Rotation Strengthening;10 reps;Theraband   Theraband Level (Shoulder External Rotation) Level 3 (Green)   Flexion Both;10 reps  active stretching to include side bends    Other  Seated Exercises closed chain pushing on a ball  10 reps    Moist Heat Therapy   Number Minutes Moist Heat 20 Minutes   Moist Heat Location Other (comment)  low back                    Short Term Clinic Goals - 09/02/14 1431    CC Short Term Goal  #1   Title pt will report pain in low back at 2/10   Status On-going   CC Short Term Goal  #2   Title Increase Berg Balance Assessment score to 40/56   Status On-going             Long Term Clinic Goals - 09/02/14 1432    CC Long Term Goal  #1   Title independence in upgraded  home exercise program to include balance activities   Status On-going   CC Long Term Goal  #2    Title Increase Berg Balance Assessment score to 40/56   Status On-going   CC Long Term Goal  #3   Title Quick Dash assessment will be less than 40    Status On-going   CC Long Term Goal  #4   Title verbalize a plan to continue with community based exercise program   Status On-going            Plan - 09/04/14 1420    Clinical Impression Statement pt with some c/o back pain today, and received relief with hot pack. She appeared to be able to move better after that. 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   PT Next Visit Plan  continue with advanced gait techniques, strengthening and recumbant bike for endurance work., nustep if available         Problem List Patient Active Problem List   Diagnosis Date Noted  . Arm edema 08/28/2014  . Vitamin D deficiency 04/23/2014  . Hyperparathyroidism 04/23/2014  . Depression 07/18/2013  . Breast cancer of upper-outer quadrant of right female breast 07/15/2013  . Encounter for therapeutic drug monitoring 07/05/2013  . Overactive bladder 01/30/2013  . DVT (deep venous thrombosis), left 12/19/2012  . HTN (hypertension) 09/27/2011  . Hearing loss 09/27/2011  . Chest wall pain 09/27/2011  . Seasonal allergies 09/27/2011  . SOB (shortness of breath), related to deconditioning 08/26/2011  . Hypothyroid 08/26/2011   [name]. Owens Shark, PT  09/04/2014, 2:33 PM  [place], [name], [address] Phone: [phone]   Fax:  [phone]

## 2014-09-09 ENCOUNTER — Ambulatory Visit: Payer: Medicare Other | Admitting: Physical Therapy

## 2014-09-09 DIAGNOSIS — M24511 Contracture, right shoulder: Secondary | ICD-10-CM | POA: Diagnosis not present

## 2014-09-09 DIAGNOSIS — R5381 Other malaise: Secondary | ICD-10-CM

## 2014-09-09 DIAGNOSIS — M542 Cervicalgia: Secondary | ICD-10-CM | POA: Diagnosis not present

## 2014-09-09 DIAGNOSIS — Z9889 Other specified postprocedural states: Secondary | ICD-10-CM | POA: Diagnosis not present

## 2014-09-09 DIAGNOSIS — C50911 Malignant neoplasm of unspecified site of right female breast: Secondary | ICD-10-CM | POA: Diagnosis not present

## 2014-09-09 DIAGNOSIS — M25511 Pain in right shoulder: Secondary | ICD-10-CM

## 2014-09-09 DIAGNOSIS — M545 Low back pain: Secondary | ICD-10-CM

## 2014-09-09 DIAGNOSIS — M25562 Pain in left knee: Secondary | ICD-10-CM

## 2014-09-09 DIAGNOSIS — M549 Dorsalgia, unspecified: Secondary | ICD-10-CM | POA: Diagnosis not present

## 2014-09-09 DIAGNOSIS — M25561 Pain in right knee: Secondary | ICD-10-CM

## 2014-09-09 DIAGNOSIS — I890 Lymphedema, not elsewhere classified: Secondary | ICD-10-CM | POA: Diagnosis not present

## 2014-09-09 NOTE — Therapy (Signed)
[place], [name], [address] Phone: [phone]   Fax:  [phone]  Physical Therapy Treatment  Patient Details  Name: Alicia Vasquez MRN: [id number] Date of Birth: Mar 31, 1941 Referring Provider:  Chauncey Cruel, MD  Encounter Date: 09/09/2014      PT End of Session - 09/09/14 1535    Visit Number 40   Number of Visits 19   Date for PT Re-Evaluation 10/10/14   PT Start Time 6962   PT Stop Time 1432   PT Time Calculation (min) 39 min      Past Medical History  Diagnosis Date  . Allergy   . Hypertension   . Hypothyroid   . Blood transfusion without reported diagnosis   . Pneumonia   . DVT (deep vein thrombosis) in pregnancy   . Breast cancer 07/12/13    invasive mammary carcinoma  . Anxiety   . Arthritis   . Radiation 11/21/13-01/07/14    Right Breast/supraclav.    Past Surgical History  Procedure Laterality Date  . Removal of teeth  04/19/2012    13 teeth removed  . Biospy      female organs  . Dilation and curettage of uterus    . Breast lumpectomy with radioactive seed localization Right 09/09/2013    Procedure: BREAST LUMPECTOMY WITH RADIOACTIVE SEED LOCALIZATION WITH AXILLARY NODE EXCISION;  Surgeon: [name], MD;  Location: [facility];  Service: General;  Laterality: Right;  . Re-excision of breast lumpectomy Right 09/24/2013    Procedure: RE-EXCISION OF RIGHT BREAST LUMPECTOMY;  Surgeon: [name], MD;  Location: [facility];  Service: General;  Laterality: Right;    There were no vitals filed for this visit.  Visit Diagnosis:  Physical deconditioning  Pain in joint, shoulder region, right  Bilateral low back pain, with sciatica presence unspecified  Arthralgia of both knees      Subjective Assessment - 09/09/14 1356    Subjective pt states she was doing a lot of walking last Thursday and Friday, and has soreness in left  knee on Saturday She says she is still getting tired when she walks in a store.    Currently in Pain? Yes   Pain Score 4    Pain Location Back   Pain Relieving Factors tylenol    Pain Score 4   Pain Location Knee   Pain Orientation Left   Pain Score 3   Pain Location Chest   Pain Orientation Right   Pain Descriptors / Indicators Aching                       [facility] Adult PT Treatment/Exercise - 09/09/14 0001    Ambulation/Gait   Ambulation/Gait Assistance Details pt ambulated about 100' x 2    Elbow Exercises   Elbow Flexion Supine;Strengthening;10 reps  2#   Knee/Hip Exercises: Aerobic   Stationary Bike --  nustep no arms:5 minutes 10 secs slow 10 secs fast for 5 min   Elliptical --  nu step 5 min at self selected   Knee/Hip Exercises: Seated   Other Seated Knee Exercises marches with trunk extension 10 reps on leg    Knee/Hip Exercises: Supine   Short Arc Quad Sets Strengthening;Both;10 reps;3 sets;Other (comment)  3#   Shoulder Exercises: Supine   Flexion AROM;Both;10 reps  no weight on right, 2 # on left    Other Supine Exercises decompression exercises    Shoulder Exercises:  Pulleys   Flexion 2 minutes   ABduction 2 minutes   Ankle Exercises: Seated   Heel Raises 10 reps                   Short Term Clinic Goals - 09/09/14 1534    CC Short Term Goal  #1   Title pt will report pain in low back at 2/10   Status On-going   CC Short Term Goal  #2   Title Increase Berg Balance Assessment score to 40/56   Status On-going             Long Term Clinic Goals - 09/09/14 1534    CC Long Term Goal  #1   Title independence in upgraded  home exercise program to include balance activities   Status On-going   CC Long Term Goal  #2   Title Increase Berg Balance Assessment score to 40/56   Status On-going   CC Long Term Goal  #3   Title Quick Dash assessment will be less than 40    Status On-going   CC Long Term Goal  #4   Title verbalize  a plan to continue with community based exercise program   Status On-going            Plan - 09/09/14 1423    Clinical Impression Statement pt with complaints of fatigue today with need for frequent rest periods during activity.  Encouraged pt to do walking as much as possible.  Upgraded program today to include interval training on nustep with varying speeds  Began instruction in deep breathing with guided imagery of her at her favorite place, the beach, to help with pain management. She said she is familiar with the technique as she has used it in her job.  She is hopeful it will help her with pain management.    PT Next Visit Plan  continue with advanced gait techniques, strengthening and continue with intervals of increased speed on nustep         Problem List Patient Active Problem List   Diagnosis Date Noted  . Arm edema 08/28/2014  . Vitamin D deficiency 04/23/2014  . Hyperparathyroidism 04/23/2014  . Depression 07/18/2013  . Breast cancer of upper-outer quadrant of right female breast 07/15/2013  . Encounter for therapeutic drug monitoring 07/05/2013  . Overactive bladder 01/30/2013  . DVT (deep venous thrombosis), left 12/19/2012  . HTN (hypertension) 09/27/2011  . Hearing loss 09/27/2011  . Chest wall pain 09/27/2011  . Seasonal allergies 09/27/2011  . SOB (shortness of breath), related to deconditioning 08/26/2011  . Hypothyroid 08/26/2011   [name]. Owens Shark, PT  09/09/2014, 3:37 PM  [place], [name], [address] Phone: [phone]   Fax:  [phone]

## 2014-09-11 ENCOUNTER — Ambulatory Visit: Payer: Medicare Other | Admitting: Physical Therapy

## 2014-09-11 DIAGNOSIS — M545 Low back pain: Secondary | ICD-10-CM

## 2014-09-11 DIAGNOSIS — M25561 Pain in right knee: Secondary | ICD-10-CM

## 2014-09-11 DIAGNOSIS — M24511 Contracture, right shoulder: Secondary | ICD-10-CM | POA: Diagnosis not present

## 2014-09-11 DIAGNOSIS — M542 Cervicalgia: Secondary | ICD-10-CM | POA: Diagnosis not present

## 2014-09-11 DIAGNOSIS — I890 Lymphedema, not elsewhere classified: Secondary | ICD-10-CM | POA: Diagnosis not present

## 2014-09-11 DIAGNOSIS — M25562 Pain in left knee: Secondary | ICD-10-CM

## 2014-09-11 DIAGNOSIS — R5381 Other malaise: Secondary | ICD-10-CM

## 2014-09-11 DIAGNOSIS — Z9889 Other specified postprocedural states: Secondary | ICD-10-CM | POA: Diagnosis not present

## 2014-09-11 DIAGNOSIS — M25511 Pain in right shoulder: Secondary | ICD-10-CM

## 2014-09-11 DIAGNOSIS — M549 Dorsalgia, unspecified: Secondary | ICD-10-CM | POA: Diagnosis not present

## 2014-09-11 DIAGNOSIS — C50911 Malignant neoplasm of unspecified site of right female breast: Secondary | ICD-10-CM | POA: Diagnosis not present

## 2014-09-11 NOTE — Therapy (Signed)
[place], [name], [address] Phone: [phone]   Fax:  [phone]  Physical Therapy Treatment  Patient Details  Name: Alicia Vasquez MRN: [id number] Date of Birth: Nov 16, 1940 Referring Provider:  [name], MD  Encounter Date: 09/11/2014      PT End of Session - 09/11/14 1430    Visit Number 26   Number of Visits 39   Date for PT Re-Evaluation 10/10/14   PT Start Time 5397   PT Stop Time 1430   PT Time Calculation (min) 56 min      Past Medical History  Diagnosis Date  . Allergy   . Hypertension   . Hypothyroid   . Blood transfusion without reported diagnosis   . Pneumonia   . DVT (deep vein thrombosis) in pregnancy   . Breast cancer 07/12/13    invasive mammary carcinoma  . Anxiety   . Arthritis   . Radiation 11/21/13-01/07/14    Right Breast/supraclav.    Past Surgical History  Procedure Laterality Date  . Removal of teeth  04/19/2012    13 teeth removed  . Biospy      female organs  . Dilation and curettage of uterus    . Breast lumpectomy with radioactive seed localization Right 09/09/2013    Procedure: BREAST LUMPECTOMY WITH RADIOACTIVE SEED LOCALIZATION WITH AXILLARY NODE EXCISION;  Surgeon: [name], MD;  Location: [facility];  Service: General;  Laterality: Right;  . Re-excision of breast lumpectomy Right 09/24/2013    Procedure: RE-EXCISION OF RIGHT BREAST LUMPECTOMY;  Surgeon: [name], MD;  Location: [facility];  Service: General;  Laterality: Right;    There were no vitals filed for this visit.  Visit Diagnosis:  Physical deconditioning  Pain in joint, shoulder region, right  Bilateral low back pain, with sciatica presence unspecified  Arthralgia of both knees      Subjective Assessment - 09/11/14 1353    Subjective pt states she is having a good day to day,    Currently in Pain? Yes   Pain Score 2    Pain  Location Back   Pain Score 3   Pain Location Knee   Pain Orientation Left   Pain Descriptors / Indicators Aching   Pain Score 2   Pain Location Shoulder   Pain Orientation Right   Pain Descriptors / Indicators Aching                       [facility] Adult PT Treatment/Exercise - 09/11/14 0001    High Level Balance   High Level Balance Activities Side stepping;Backward walking;Direction changes;Turns;Sudden stops  focused on larger steps and changing speeds   Elbow Exercises   Elbow Flexion Strengthening;20 reps;Standing  2#   Neck Exercises: Seated   Cervical Rotation Both   Lateral Flexion Both;5 reps   Shoulder Shrugs 5 reps   Shoulder Rolls Backwards;5 reps   Shoulder Flexion 10 reps  with cane    Lumbar Exercises: Standing   Other Standing Lumbar Exercises standing  hip abduction    Lumbar Exercises: Supine   Bent Knee Raise 10 reps   Bridge 5 reps   Other Supine Lumbar Exercises lower trunk rotations   Other Supine Lumbar Exercises spinal decompression   Knee/Hip Exercises: Aerobic   Stationary Bike 10 minutes at level 2  difficulty keeping speed up on bike, needed rest breaks    Elliptical --  nu step 5  min at self selected   Knee/Hip Exercises: Seated   Long Arc Quad 10 reps;AAROM;Both   Other Seated Knee Exercises marches with trunk extension 10 reps on leg    Other Seated Knee Exercises hip abduction with green theraband around knees    Knee/Hip Exercises: Supine   Short Arc Quad Sets Strengthening;Both;10 reps;3 sets;Other (comment)  4#   Knee/Hip Exercises: Sidelying   Hip ABduction 5 reps;Both   Clams 5 reps both    Shoulder Exercises: Supine   Horizontal ABduction Strengthening;10 reps;Theraband   Theraband Level (Shoulder Horizontal ABduction) Level 2 (Red)   External Rotation Strengthening;Both;Theraband   Theraband Level (Shoulder External Rotation) Level 2 (Red)   Flexion AROM;Both;10 reps  no weight on right, 2 # on left    Other  Supine Exercises red theraband  shoulder flexion with narronw grip, wide grip , diagonal elevation    Shoulder Exercises: Seated   Row Strengthening;12 reps   Theraband Level (Shoulder Row) Level 3 (Green)   External Rotation Strengthening;10 reps;Theraband   Theraband Level (Shoulder External Rotation) Level 3 (Green)   Flexion Both;10 reps  active stretching to include side bends    Other Seated Exercises closed chain pushing on a ball  10 reps    Shoulder Exercises: Pulleys   Flexion 2 minutes   ABduction 2 minutes   Moist Heat Therapy   Moist Heat Location Other (comment)  low back    Ankle Exercises: Seated   Heel Raises 10 reps                PT Education - 09/11/14 1428    Education provided Yes   Education Details talked about communtiy exercises and gave her a handout with all silver sneakers programs withing 30 [place] radius of [id number]   Person(s) Educated Patient   Methods Explanation;Handout   Comprehension Verbalized understanding           Short Term Clinic Goals - 09/11/14 1414    CC Short Term Goal  #1   Title pt will report pain in low back at 2/10   Baseline 2/10 on 09/11/2014   Status Achieved   CC Short Term Goal  #2   Title Increase Berg Balance Assessment score to 40/56   Status On-going             Long Term Clinic Goals - 09/11/14 1415    CC Long Term Goal  #1   Title independence in upgraded  home exercise program to include balance activities   Status On-going   CC Long Term Goal  #2   Title Increase Berg Balance Assessment score to 40/56   Status On-going   CC Long Term Goal  #3   Title Quick Dash assessment will be less than 40    Status On-going   CC Long Term Goal  #4   Title verbalize a plan to continue with community based exercise program   Status On-going            Plan - 09/11/14 1431    Clinical Impression Statement good participation with exercise today. Worked toward goal of finding community exercise  program. 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   PT Treatment/Interventions ADLs/Self Care Home Management;Therapeutic exercise;Patient/family education;Therapeutic activities   PT Next Visit Plan  continue  with advanced gait techniques, strengthening and continue with intervals of increased speed on nustep , upgrade home program to include balance activities including trunk rotation         Problem List Patient Active Problem List   Diagnosis Date Noted  . Arm edema 08/28/2014  . Vitamin D deficiency 04/23/2014  . Hyperparathyroidism 04/23/2014  . Depression 07/18/2013  . Breast cancer of upper-outer quadrant of right female breast 07/15/2013  . Encounter for therapeutic drug monitoring 07/05/2013  . Overactive bladder 01/30/2013  . DVT (deep venous thrombosis), left 12/19/2012  . HTN (hypertension) 09/27/2011  . Hearing loss 09/27/2011  . Chest wall pain 09/27/2011  . Seasonal allergies 09/27/2011  . SOB (shortness of breath), related to deconditioning 08/26/2011  . Hypothyroid 08/26/2011   [name]. Owens Shark, PT  09/11/2014, 2:34 PM  [place], [name], [address] Phone: [phone]   Fax:  [phone]

## 2014-09-16 ENCOUNTER — Ambulatory Visit: Payer: Medicare Other | Admitting: Physical Therapy

## 2014-09-16 DIAGNOSIS — C50911 Malignant neoplasm of unspecified site of right female breast: Secondary | ICD-10-CM | POA: Diagnosis not present

## 2014-09-16 DIAGNOSIS — M542 Cervicalgia: Secondary | ICD-10-CM | POA: Diagnosis not present

## 2014-09-16 DIAGNOSIS — M24511 Contracture, right shoulder: Secondary | ICD-10-CM | POA: Diagnosis not present

## 2014-09-16 DIAGNOSIS — M25561 Pain in right knee: Secondary | ICD-10-CM

## 2014-09-16 DIAGNOSIS — I890 Lymphedema, not elsewhere classified: Secondary | ICD-10-CM | POA: Diagnosis not present

## 2014-09-16 DIAGNOSIS — R5381 Other malaise: Secondary | ICD-10-CM

## 2014-09-16 DIAGNOSIS — M549 Dorsalgia, unspecified: Secondary | ICD-10-CM | POA: Diagnosis not present

## 2014-09-16 DIAGNOSIS — M25511 Pain in right shoulder: Secondary | ICD-10-CM

## 2014-09-16 DIAGNOSIS — M545 Low back pain: Secondary | ICD-10-CM

## 2014-09-16 DIAGNOSIS — Z9889 Other specified postprocedural states: Secondary | ICD-10-CM | POA: Diagnosis not present

## 2014-09-16 DIAGNOSIS — M25562 Pain in left knee: Secondary | ICD-10-CM

## 2014-09-16 NOTE — Therapy (Signed)
[place], [name], [address] Phone: [phone]   Fax:  [phone]  Physical Therapy Treatment  Patient Details  Name: Alicia Vasquez MRN: [id number] Date of Birth: 1940/12/09 Referring Provider:  [name], MD  Encounter Date: 09/16/2014      PT End of Session - 09/16/14 1719    Visit Number 70   Number of Visits 81   Date for PT Re-Evaluation 10/10/14   PT Start Time 1400   PT Stop Time 1430   PT Time Calculation (min) 30 min      Past Medical History  Diagnosis Date  . Allergy   . Hypertension   . Hypothyroid   . Blood transfusion without reported diagnosis   . Pneumonia   . DVT (deep vein thrombosis) in pregnancy   . Breast cancer 07/12/13    invasive mammary carcinoma  . Anxiety   . Arthritis   . Radiation 11/21/13-01/07/14    Right Breast/supraclav.    Past Surgical History  Procedure Laterality Date  . Removal of teeth  04/19/2012    13 teeth removed  . Biospy      female organs  . Dilation and curettage of uterus    . Breast lumpectomy with radioactive seed localization Right 09/09/2013    Procedure: BREAST LUMPECTOMY WITH RADIOACTIVE SEED LOCALIZATION WITH AXILLARY NODE EXCISION;  Surgeon: [name], MD;  Location: [facility];  Service: General;  Laterality: Right;  . Re-excision of breast lumpectomy Right 09/24/2013    Procedure: RE-EXCISION OF RIGHT BREAST LUMPECTOMY;  Surgeon: [name], MD;  Location: [facility];  Service: General;  Laterality: Right;    There were no vitals filed for this visit.  Visit Diagnosis:  Physical deconditioning  Pain in joint, shoulder region, right  Bilateral low back pain, with sciatica presence unspecified  Arthralgia of both knees      Subjective Assessment - 09/16/14 1401    Subjective "I just took tylenol"  She said she spent alot of time typing yesteday (7 hours)  and her  shoulder was hurting.   Currently in Pain? Yes   Pain Score 2    Pain Location Back   Pain Score 2   Pain Location Knee   Pain Score 2   Pain Location Shoulder   Pain Orientation Right   Pain Descriptors / Indicators Aching                         [facility] Adult PT Treatment/Exercise - 09/16/14 0001    Ambulation/Gait   Ambulation/Gait Assistance Details walked at self seleceted pace ~ 7 minutes. HR. 107 or sats 96% pt dyneic but still able to talke.   High Level Balance   High Level Balance Activities Other (comment)  increase velocity   Elbow Exercises   Elbow Flexion Strengthening;20 reps;Standing  2#   Neck Exercises: Seated   Cervical Rotation Both   Lateral Flexion Both;5 reps   Shoulder Shrugs 5 reps   Shoulder Rolls Backwards;5 reps   Shoulder Flexion 10 reps  with cane    Lumbar Exercises: Standing   Other Standing Lumbar Exercises standing  hip abduction    Lumbar Exercises: Supine   Bent Knee Raise 10 reps   Bridge 5 reps   Other Supine Lumbar Exercises lower trunk rotations   Other Supine Lumbar Exercises spinal decompression   Knee/Hip Exercises: Aerobic   Stationary Bike --  difficulty  keeping speed up on bike, needed rest breaks    Elliptical 2 minutes at slow speed level 1 incline . pt limited by dyspnea   Knee/Hip Exercises: Seated   Long Arc Quad --   Other Seated Knee Exercises marches with trunk extension 10 reps on leg    Other Seated Knee Exercises --   Knee/Hip Exercises: Supine   Short Arc Quad Sets --   Knee/Hip Exercises: Sidelying   Hip ABduction --   Clams --   Shoulder Exercises: Supine   Horizontal ABduction --   Theraband Level (Shoulder Horizontal ABduction) --   External Rotation --   Theraband Level (Shoulder External Rotation) --   Flexion --  no weight on right, 2 # on left    Other Supine Exercises --   Shoulder Exercises: Seated   Row Strengthening;12 reps   Theraband Level (Shoulder Row) Level 3 (Green)    External Rotation --   Theraband Level (Shoulder External Rotation) --   Flexion Both;10 reps  active stretching to include side bends    Other Seated Exercises --   Shoulder Exercises: Pulleys   Flexion 2 minutes   ABduction 2 minutes                   Short Term Clinic Goals - 09/23/14 1723    CC Short Term Goal  #2   Title Increase Berg Balance Assessment score to 40/56   Status On-going             Long Term Clinic Goals - September 23, 2014 1723    CC Long Term Goal  #1   Title independence in upgraded  home exercise program to include balance activities   Status On-going   CC Long Term Goal  #2   Title Increase Berg Balance Assessment score to 40/56   Status On-going   CC Long Term Goal  #3   Title Quick Dash assessment will be less than 40    Status On-going   CC Long Term Goal  #4   Title verbalize a plan to continue with community based exercise program   Status On-going            Plan - 09/23/14 1720    Clinical Impression Statement pt 15 minutes late today. she states she looked at possible [employer], but did not think alot about it.  Worked on ambulation endurance and changing speeds today. Pt limited by dyspena and c/o knee pain. encouraged pt to walk more frequently at home   PT Next Visit Plan  continue with advanced gait techniques, strengthening and continue with intervals of increased speed on nustep , upgrade home program to include balance activities including trunk rotation           G-Codes - 23-Sep-2014 1724    Functional Assessment Tool Used clinical judgement   Functional Limitation Other PT primary   Other PT Primary Current Status (Z2248) At least 20 percent but less than 40 percent impaired, limited or restricted   Other PT Primary Goal Status (G5003) At least 20 percent but less than 40 percent impaired, limited or restricted      Problem List Patient Active Problem List   Diagnosis Date Noted  . Arm edema  08/28/2014  . Vitamin D deficiency 04/23/2014  . Hyperparathyroidism 04/23/2014  . Depression 07/18/2013  . Breast cancer of upper-outer quadrant of right female breast 07/15/2013  . Encounter for therapeutic drug monitoring 07/05/2013  . Overactive bladder 01/30/2013  .  DVT (deep venous thrombosis), left 12/19/2012  . HTN (hypertension) 09/27/2011  . Hearing loss 09/27/2011  . Chest wall pain 09/27/2011  . Seasonal allergies 09/27/2011  . SOB (shortness of breath), related to deconditioning 08/26/2011  . Hypothyroid 08/26/2011   [name]. Owens Shark, PT 09/16/2014, 5:25 PM  [place], [name], [address] Phone: [phone]   Fax:  [phone]

## 2014-09-18 ENCOUNTER — Ambulatory Visit: Payer: Medicare Other | Admitting: Physical Therapy

## 2014-09-18 DIAGNOSIS — R5381 Other malaise: Secondary | ICD-10-CM

## 2014-09-18 DIAGNOSIS — M25561 Pain in right knee: Secondary | ICD-10-CM

## 2014-09-18 DIAGNOSIS — M545 Low back pain: Secondary | ICD-10-CM

## 2014-09-18 DIAGNOSIS — I890 Lymphedema, not elsewhere classified: Secondary | ICD-10-CM | POA: Diagnosis not present

## 2014-09-18 DIAGNOSIS — M25511 Pain in right shoulder: Secondary | ICD-10-CM

## 2014-09-18 DIAGNOSIS — M549 Dorsalgia, unspecified: Secondary | ICD-10-CM | POA: Diagnosis not present

## 2014-09-18 DIAGNOSIS — M25562 Pain in left knee: Secondary | ICD-10-CM

## 2014-09-18 DIAGNOSIS — C50911 Malignant neoplasm of unspecified site of right female breast: Secondary | ICD-10-CM | POA: Diagnosis not present

## 2014-09-18 DIAGNOSIS — Z9889 Other specified postprocedural states: Secondary | ICD-10-CM | POA: Diagnosis not present

## 2014-09-18 DIAGNOSIS — M542 Cervicalgia: Secondary | ICD-10-CM | POA: Diagnosis not present

## 2014-09-18 DIAGNOSIS — M24511 Contracture, right shoulder: Secondary | ICD-10-CM | POA: Diagnosis not present

## 2014-09-18 NOTE — Therapy (Signed)
[place], [name], [address] Phone: [phone]   Fax:  [phone]  Physical Therapy Treatment  Patient Details  Name: Alicia Vasquez MRN: [id number] Date of Birth: 09-Aug-1940 Referring Provider:  [name], MD  Encounter Date: 09/18/2014      PT End of Session - 09/18/14 1710    PT Start Time 4401   PT Stop Time 1430   PT Time Calculation (min) 38 min      Past Medical History  Diagnosis Date  . Allergy   . Hypertension   . Hypothyroid   . Blood transfusion without reported diagnosis   . Pneumonia   . DVT (deep vein thrombosis) in pregnancy   . Breast cancer 07/12/13    invasive mammary carcinoma  . Anxiety   . Arthritis   . Radiation 11/21/13-01/07/14    Right Breast/supraclav.    Past Surgical History  Procedure Laterality Date  . Removal of teeth  04/19/2012    13 teeth removed  . Biospy      female organs  . Dilation and curettage of uterus    . Breast lumpectomy with radioactive seed localization Right 09/09/2013    Procedure: BREAST LUMPECTOMY WITH RADIOACTIVE SEED LOCALIZATION WITH AXILLARY NODE EXCISION;  Surgeon: [name], MD;  Location: [facility];  Service: General;  Laterality: Right;  . Re-excision of breast lumpectomy Right 09/24/2013    Procedure: RE-EXCISION OF RIGHT BREAST LUMPECTOMY;  Surgeon: [name], MD;  Location: [facility];  Service: General;  Laterality: Right;    There were no vitals filed for this visit.  Visit Diagnosis:  Physical deconditioning  Pain in joint, shoulder region, right  Bilateral low back pain, with sciatica presence unspecified  Arthralgia of both knees      Subjective Assessment - 09/18/14 1359    Subjective Pt states she is trying to walk more every day " i like it where there is sidelwalks"   She still feels like she gets short of breath with exertion.  she has more pain when  she first gets out of bed in the morning " By afternoon I'm better"  Pt states she called the [facility] about the [employer] program   Currently in Pain? Yes   Pain Score 2    Pain Location Back   Pain Radiating Towards not too bad today    Pain Onset More than a month ago   Pain Frequency Intermittent   Aggravating Factors  walking   Pain Relieving Factors tylenol    Pain Score 2   Pain Orientation Left   Pain Descriptors / Indicators Aching   Pain Score 2   Pain Location Shoulder   Pain Orientation Right   Pain Descriptors / Indicators Aching                         [facility] Adult PT Treatment/Exercise - 09/18/14 0001    Neck Exercises: Seated   Other Seated Exercise trunk rotation   Lumbar Exercises: Supine   Bent Knee Raise 10 reps   Bridge 5 reps   Other Supine Lumbar Exercises lower trunk rotations   Knee/Hip Exercises: Aerobic   Isokinetic --  nustep 5 min,level3 15 second intervals at fast/slow speeds    Knee/Hip Exercises: Supine   Short Arc Quad Sets Strengthening;Both;10 reps;Other (comment);2 sets  5#   Knee/Hip Exercises: Sidelying   Hip ABduction AROM;Both;10 reps  Shoulder Exercises: Supine   External Rotation Strengthening;10 reps;Theraband   Theraband Level (Shoulder External Rotation) Level 2 (Red)   Flexion Strengthening;10 reps;Theraband  narrow and wide grip   Theraband Level (Shoulder Flexion) Level 2 (Red)   ABduction Strengthening;10 reps;Theraband   Theraband Level (Shoulder ABduction) Level 2 (Red)   Other Supine Exercises "draw the sword"  10 reps with each arm with green theraband   Other Supine Exercises red theraband "draw the sword"                   Short Term Clinic Goals - 09/16/14 1723    CC Short Term Goal  #2   Title Increase Berg Balance Assessment score to 40/56   Status On-going             Long Term Clinic Goals - 09/16/14 1723    CC Long Term Goal  #1   Title independence in  upgraded  home exercise program to include balance activities   Status On-going   CC Long Term Goal  #2   Title Increase Berg Balance Assessment score to 40/56   Status On-going   CC Long Term Goal  #3   Title Quick Dash assessment will be less than 40    Status On-going   CC Long Term Goal  #4   Title verbalize a plan to continue with community based exercise program   Status On-going            Plan - 09/18/14 1711    Clinical Impression Statement Pt seems to be making slow gains toward goals.  considering silver sneakers at [place] and did better with strengthening.  upgraded nustep program to include varying velocities   PT Next Visit Plan  continue with advanced gait techniques, strengthening and continue with intervals of increased speed on nustep , upgrade home program to include balance activities including trunk rotation         Problem List  [name]. Owens Shark, PT   09/18/2014, 5:14 PM  [place], [name], [address] Phone: [phone]   Fax:  [phone]

## 2014-09-23 ENCOUNTER — Ambulatory Visit: Payer: Medicare Other | Admitting: Physical Therapy

## 2014-09-23 DIAGNOSIS — I890 Lymphedema, not elsewhere classified: Secondary | ICD-10-CM | POA: Diagnosis not present

## 2014-09-23 DIAGNOSIS — M545 Low back pain: Secondary | ICD-10-CM

## 2014-09-23 DIAGNOSIS — Z9889 Other specified postprocedural states: Secondary | ICD-10-CM | POA: Diagnosis not present

## 2014-09-23 DIAGNOSIS — R5381 Other malaise: Secondary | ICD-10-CM

## 2014-09-23 DIAGNOSIS — M25561 Pain in right knee: Secondary | ICD-10-CM

## 2014-09-23 DIAGNOSIS — M542 Cervicalgia: Secondary | ICD-10-CM | POA: Diagnosis not present

## 2014-09-23 DIAGNOSIS — M25511 Pain in right shoulder: Secondary | ICD-10-CM

## 2014-09-23 DIAGNOSIS — M24511 Contracture, right shoulder: Secondary | ICD-10-CM | POA: Diagnosis not present

## 2014-09-23 DIAGNOSIS — M25562 Pain in left knee: Secondary | ICD-10-CM

## 2014-09-23 DIAGNOSIS — M549 Dorsalgia, unspecified: Secondary | ICD-10-CM | POA: Diagnosis not present

## 2014-09-23 DIAGNOSIS — C50911 Malignant neoplasm of unspecified site of right female breast: Secondary | ICD-10-CM | POA: Diagnosis not present

## 2014-09-23 NOTE — Therapy (Signed)
[place], [name], [address] Phone: [phone]   Fax:  [phone]  Physical Therapy Treatment  Patient Details  Name: Alicia Vasquez MRN: [id number] Date of Birth: Oct 05, 1940 Referring Provider:  [name], MD  Encounter Date: 09/23/2014      PT End of Session - 09/23/14 1736    Visit Number 72   Number of Visits 48   Date for PT Re-Evaluation 10/10/14   PT Start Time 3710   PT Stop Time 1433   PT Time Calculation (min) 39 min      Past Medical History  Diagnosis Date  . Allergy   . Hypertension   . Hypothyroid   . Blood transfusion without reported diagnosis   . Pneumonia   . DVT (deep vein thrombosis) in pregnancy   . Breast cancer 07/12/13    invasive mammary carcinoma  . Anxiety   . Arthritis   . Radiation 11/21/13-01/07/14    Right Breast/supraclav.    Past Surgical History  Procedure Laterality Date  . Removal of teeth  04/19/2012    13 teeth removed  . Biospy      female organs  . Dilation and curettage of uterus    . Breast lumpectomy with radioactive seed localization Right 09/09/2013    Procedure: BREAST LUMPECTOMY WITH RADIOACTIVE SEED LOCALIZATION WITH AXILLARY NODE EXCISION;  Surgeon: [name], MD;  Location: [facility];  Service: General;  Laterality: Right;  . Re-excision of breast lumpectomy Right 09/24/2013    Procedure: RE-EXCISION OF RIGHT BREAST LUMPECTOMY;  Surgeon: [name], MD;  Location: [facility];  Service: General;  Laterality: Right;    There were no vitals filed for this visit.  Visit Diagnosis:  Physical deconditioning  Pain in joint, shoulder region, right  Bilateral low back pain, with sciatica presence unspecified  Arthralgia of both knees      Subjective Assessment - 09/23/14 1355    Subjective Pt states she did not feel well this morning, but took some tylenol so she is doing ok right  now.  "just short of breath"    Currently in Pain? Yes   Pain Score 2    Pain Location Back   Pain Score 2   Pain Location Knee   Pain Orientation Left   Pain Descriptors / Indicators Aching   Pain Score 3   Pain Location Shoulder   Pain Orientation Right                         [facility] Adult PT Treatment/Exercise - 09/23/14 0001    High Level Balance   High Level Balance Activities Side stepping;Backward walking;Direction changes;Sudden stops;Marching forwards;Marching backwards  worked in parallel bars   Neck Exercises: Seated   Cervical Rotation Both   Lateral Flexion Both;5 reps   Shoulder Shrugs 5 reps   Shoulder Rolls Backwards;5 reps   Shoulder Flexion 10 reps  with cane    Other Seated Exercise trunk rotation   Lumbar Exercises: Stretches   Lower Trunk Rotation 5 reps  in standing with reach across and then out   Lumbar Exercises: Standing   Other Standing Lumbar Exercises standing trunk rotation exercises   Knee/Hip Exercises: Stretches   Hip Flexor Stretch 10 seconds  standing   Soleus Stretch 10 seconds;3 reps  standing with eccentric control   Knee/Hip Exercises: Aerobic   Isokinetic --  nustep 4mn,level3 15 second intervals at fast/slow  speeds    Knee/Hip Exercises: Standing   Knee Flexion AROM;Both;10 reps   Knee Flexion Limitations difficulty with behind toe taps                   Short Term Clinic Goals - 09/23/14 1740    CC Short Term Goal  #2   Title Increase Berg Balance Assessment score to 40/56   Status On-going             Long Term Clinic Goals - 09/23/14 1740    CC Long Term Goal  #1   Title independence in upgraded  home exercise program to include balance activities   Status On-going   CC Long Term Goal  #2   Title Increase Berg Balance Assessment score to 40/56   Status On-going   CC Long Term Goal  #3   Title Quick Dash assessment will be less than 40    Status On-going   CC Long Term Goal  #4    Title verbalize a plan to continue with community based exercise program   Status On-going   CC Long Term Goal  #5   Title pt will report that she is familiar with exercise equipment and ready to transition to [facility] progam    Status Not Met            Plan - 09/23/14 1738    Clinical Impression Statement pt continues with complaint of pain and shortness of breath, but appears to be better able to particpate in PT.  Slow, but steady progress   PT Next Visit Plan  continue with advanced gait techniques, strengthening and continue with intervals of increased speed on nustep , upgrade home program to include balance activities including trunk rotation         Problem List Patient Active Problem List   Diagnosis Date Noted  . Arm edema 08/28/2014  . Vitamin D deficiency 04/23/2014  . Hyperparathyroidism 04/23/2014  . Depression 07/18/2013  . Breast cancer of upper-outer quadrant of right female breast 07/15/2013  . Encounter for therapeutic drug monitoring 07/05/2013  . Overactive bladder 01/30/2013  . DVT (deep venous thrombosis), left 12/19/2012  . HTN (hypertension) 09/27/2011  . Hearing loss 09/27/2011  . Chest wall pain 09/27/2011  . Seasonal allergies 09/27/2011  . SOB (shortness of breath), related to deconditioning 08/26/2011  . Hypothyroid 08/26/2011   [name]. Owens Shark, PT  09/23/2014, 5:43 PM  [place], [name], [address] Phone: [phone]   Fax:  [phone]

## 2014-09-24 ENCOUNTER — Ambulatory Visit (INDEPENDENT_AMBULATORY_CARE_PROVIDER_SITE_OTHER): Payer: Medicare Other

## 2014-09-24 ENCOUNTER — Ambulatory Visit: Payer: Medicare Other

## 2014-09-24 DIAGNOSIS — I82402 Acute embolism and thrombosis of unspecified deep veins of left lower extremity: Secondary | ICD-10-CM | POA: Diagnosis not present

## 2014-09-24 DIAGNOSIS — Z5181 Encounter for therapeutic drug level monitoring: Secondary | ICD-10-CM

## 2014-09-24 LAB — POCT INR: INR: 2.6

## 2014-09-24 NOTE — Progress Notes (Signed)
Pre visit review using our clinic review tool, if applicable. No additional management support is needed unless otherwise documented below in the visit note. 

## 2014-09-24 NOTE — Progress Notes (Signed)
Agree with plan 

## 2014-09-25 ENCOUNTER — Ambulatory Visit: Payer: Medicare Other | Admitting: Physical Therapy

## 2014-09-25 DIAGNOSIS — Z9889 Other specified postprocedural states: Secondary | ICD-10-CM | POA: Diagnosis not present

## 2014-09-25 DIAGNOSIS — C50911 Malignant neoplasm of unspecified site of right female breast: Secondary | ICD-10-CM | POA: Diagnosis not present

## 2014-09-25 DIAGNOSIS — M25562 Pain in left knee: Secondary | ICD-10-CM

## 2014-09-25 DIAGNOSIS — M25561 Pain in right knee: Secondary | ICD-10-CM

## 2014-09-25 DIAGNOSIS — M545 Low back pain: Secondary | ICD-10-CM

## 2014-09-25 DIAGNOSIS — M549 Dorsalgia, unspecified: Secondary | ICD-10-CM | POA: Diagnosis not present

## 2014-09-25 DIAGNOSIS — M24511 Contracture, right shoulder: Secondary | ICD-10-CM | POA: Diagnosis not present

## 2014-09-25 DIAGNOSIS — M25511 Pain in right shoulder: Secondary | ICD-10-CM

## 2014-09-25 DIAGNOSIS — R5381 Other malaise: Secondary | ICD-10-CM

## 2014-09-25 DIAGNOSIS — I890 Lymphedema, not elsewhere classified: Secondary | ICD-10-CM | POA: Diagnosis not present

## 2014-09-25 DIAGNOSIS — M542 Cervicalgia: Secondary | ICD-10-CM | POA: Diagnosis not present

## 2014-09-25 NOTE — Therapy (Signed)
[place], [name], [address] Phone: [phone]   Fax:  [phone]  Physical Therapy Treatment  Patient Details  Name: Alicia Vasquez MRN: [id number] Date of Birth: 1941/04/27 Referring Provider:  [name], MD  Encounter Date: 09/25/2014      PT End of Session - 09/25/14 1429    Visit Number 40   Number of Visits 12   Date for PT Re-Evaluation 10/10/14   PT Start Time 0254   PT Stop Time 1430   PT Time Calculation (min) 45 min      Past Medical History  Diagnosis Date  . Allergy   . Hypertension   . Hypothyroid   . Blood transfusion without reported diagnosis   . Pneumonia   . DVT (deep vein thrombosis) in pregnancy   . Breast cancer 07/12/13    invasive mammary carcinoma  . Anxiety   . Arthritis   . Radiation 11/21/13-01/07/14    Right Breast/supraclav.    Past Surgical History  Procedure Laterality Date  . Removal of teeth  04/19/2012    13 teeth removed  . Biospy      female organs  . Dilation and curettage of uterus    . Breast lumpectomy with radioactive seed localization Right 09/09/2013    Procedure: BREAST LUMPECTOMY WITH RADIOACTIVE SEED LOCALIZATION WITH AXILLARY NODE EXCISION;  Surgeon: [name], MD;  Location: [facility];  Service: General;  Laterality: Right;  . Re-excision of breast lumpectomy Right 09/24/2013    Procedure: RE-EXCISION OF RIGHT BREAST LUMPECTOMY;  Surgeon: [name], MD;  Location: [facility];  Service: General;  Laterality: Right;    There were no vitals filed for this visit.  Visit Diagnosis:  Physical deconditioning  Pain in joint, shoulder region, right  Bilateral low back pain, with sciatica presence unspecified  Arthralgia of both knees      Subjective Assessment - 09/25/14 1355    Subjective Not as good as Tuesday.  she c/o pain in left knee She did a lot of walking on [address]  yesterday   Currently in Pain? Yes   Pain Score 2    Pain Location Back   Pain Orientation Right;Left   Pain Descriptors / Indicators Jabbing   Pain Type Chronic pain   Pain Radiating Towards down left leg   Pain Frequency --  worse in the morning   Aggravating Factors  when she first gets up in the morning   Pain Relieving Factors movement   Effect of Pain on Daily Activities slows me down doing everything   Pain Score 4   Pain Location Knee   Pain Orientation Left   Pain Descriptors / Indicators Aching;Throbbing   Pain Type Chronic pain   Pain Radiating Towards starts in front and does to area where blood clot was   Pain Frequency Intermittent  hleped by pain medicine    Pain Score 2   Pain Location Shoulder   Pain Orientation Right   Pain Descriptors / Indicators Aching   Pain Type Chronic pain   Pain Frequency Intermittent  worse with activity                         [facility] Adult PT Treatment/Exercise - 09/25/14 0001    High Level Balance   High Level Balance Activities Side stepping;Backward walking;Direction changes;Turns;Tandem walking  tandom and feet together  stand and balance with varied arms  High Level Balance Comments worked in [employer] today for various balance activites, able to use eyes for balance assist and maintain balance with narrower base   Lumbar Exercises: Stretches   Lower Trunk Rotation 5 reps  supine   Lumbar Exercises: Standing   Heel Raises 5 reps   Other Standing Lumbar Exercises bilateral chop and lift from drawer to cabinet   Lumbar Exercises: Supine   Other Supine Lumbar Exercises active relaxation and diphragmatic breasthing   Knee/Hip Exercises: Aerobic   Isokinetic nustep 15 mn at level 3 with random speed increased    Knee/Hip Exercises: Standing   Other Standing Knee Exercises standing hip aduction x 10 reps                    Short Term Clinic Goals - 09/23/14 1740    CC Short Term Goal  #2    Title Increase Berg Balance Assessment score to 40/56   Status On-going             Long Term Clinic Goals - 09/23/14 1740    CC Long Term Goal  #1   Title independence in upgraded  home exercise program to include balance activities   Status On-going   CC Long Term Goal  #2   Title Increase Berg Balance Assessment score to 40/56   Status On-going   CC Long Term Goal  #3   Title Quick Dash assessment will be less than 40    Status On-going   CC Long Term Goal  #4   Title verbalize a plan to continue with community based exercise program   Status On-going   CC Long Term Goal  #5   Title pt will report that she is familiar with exercise equipment and ready to transition to [facility] progam    Status Not Met            Plan - 09/25/14 1430    Clinical Impression Statement Pt was able to pariticipate with more in standing with emphasis on abdominal activation and rotation.  She still has c/o pain ,but focus is on improving function.   Clinical Impairments Affecting Rehab Potential medical issues with parathyroid and coumadin regulation, arthritis in knees and back, effects of cancer surgery and radiation.   PT Next Visit Plan  continue with advanced gait techniques, strengthening upgrade home program to include balance activities including trunk rotation         Problem List Patient Active Problem List   Diagnosis Date Noted  . Arm edema 08/28/2014  . Vitamin D deficiency 04/23/2014  . Hyperparathyroidism 04/23/2014  . Depression 07/18/2013  . Breast cancer of upper-outer quadrant of right female breast 07/15/2013  . Encounter for therapeutic drug monitoring 07/05/2013  . Overactive bladder 01/30/2013  . DVT (deep venous thrombosis), left 12/19/2012  . HTN (hypertension) 09/27/2011  . Hearing loss 09/27/2011  . Chest wall pain 09/27/2011  . Seasonal allergies 09/27/2011  . SOB (shortness of breath), related to deconditioning 08/26/2011  . Hypothyroid 08/26/2011    [name]. Owens Shark PT  [name] 09/25/2014, 2:34 PM  [place], [name], [address] Phone: [phone]   Fax:  [phone]

## 2014-09-30 ENCOUNTER — Ambulatory Visit: Payer: Medicare Other | Attending: Oncology | Admitting: Physical Therapy

## 2014-09-30 DIAGNOSIS — I890 Lymphedema, not elsewhere classified: Secondary | ICD-10-CM | POA: Diagnosis not present

## 2014-09-30 DIAGNOSIS — M24511 Contracture, right shoulder: Secondary | ICD-10-CM | POA: Diagnosis not present

## 2014-09-30 DIAGNOSIS — Z9889 Other specified postprocedural states: Secondary | ICD-10-CM | POA: Insufficient documentation

## 2014-09-30 DIAGNOSIS — M542 Cervicalgia: Secondary | ICD-10-CM | POA: Diagnosis not present

## 2014-09-30 DIAGNOSIS — M549 Dorsalgia, unspecified: Secondary | ICD-10-CM | POA: Insufficient documentation

## 2014-09-30 DIAGNOSIS — M545 Low back pain: Secondary | ICD-10-CM

## 2014-09-30 DIAGNOSIS — M25511 Pain in right shoulder: Secondary | ICD-10-CM

## 2014-09-30 DIAGNOSIS — C50911 Malignant neoplasm of unspecified site of right female breast: Secondary | ICD-10-CM | POA: Diagnosis not present

## 2014-09-30 DIAGNOSIS — M25562 Pain in left knee: Secondary | ICD-10-CM

## 2014-09-30 DIAGNOSIS — M25561 Pain in right knee: Secondary | ICD-10-CM

## 2014-09-30 DIAGNOSIS — R5381 Other malaise: Secondary | ICD-10-CM

## 2014-09-30 NOTE — Patient Instructions (Signed)
  Knee High   Holding stable object, raise knee to hip level, then lower knee. Repeat with other knee. Complete __10_ repetitions. Do __2__ sessions per day.  ABDUCTION: Standing (Active)   Stand, feet flat. Lift right leg out to side. Use _0__ lbs. Complete __10_ repetitions. Perform __2_ sessions per day.  ADDUCTION: Standing - Stable (Active)   Stand, right leg out to side as far as possible. Draw leg in across midline. Use _0__ lbs. Complete 10_ repetitions. Perform _2__ sessions per day.       EXTENSION: Standing (Active)  Stand, both feet flat. Draw right leg behind body as far as possible. Use 0___ lbs. Complete 10 repetitions. Perform __2_ sessions per day.    Side stepping at kitchen counter. Copyright  [employer]. All rights reserved.

## 2014-09-30 NOTE — Therapy (Signed)
[place], [name], [address] Phone: [phone]   Fax:  [phone]  Patient Details  Name: Alicia Vasquez MRN: [id number] Date of Birth: December 19, 1940 Referring Provider:  [name], MD  Encounter Date: 09/30/2014  [name]. Owens Shark, PT  09/30/2014, 4:57 PM  [place], [name], [address] Phone: [phone]   Fax:  [phone]

## 2014-10-02 ENCOUNTER — Ambulatory Visit: Payer: Medicare Other | Admitting: Physical Therapy

## 2014-10-02 DIAGNOSIS — I890 Lymphedema, not elsewhere classified: Secondary | ICD-10-CM | POA: Diagnosis not present

## 2014-10-02 DIAGNOSIS — R5381 Other malaise: Secondary | ICD-10-CM

## 2014-10-02 DIAGNOSIS — M542 Cervicalgia: Secondary | ICD-10-CM | POA: Diagnosis not present

## 2014-10-02 DIAGNOSIS — M25561 Pain in right knee: Secondary | ICD-10-CM

## 2014-10-02 DIAGNOSIS — M545 Low back pain: Secondary | ICD-10-CM

## 2014-10-02 DIAGNOSIS — C50911 Malignant neoplasm of unspecified site of right female breast: Secondary | ICD-10-CM | POA: Diagnosis not present

## 2014-10-02 DIAGNOSIS — M549 Dorsalgia, unspecified: Secondary | ICD-10-CM | POA: Diagnosis not present

## 2014-10-02 DIAGNOSIS — Z9889 Other specified postprocedural states: Secondary | ICD-10-CM | POA: Diagnosis not present

## 2014-10-02 DIAGNOSIS — M24511 Contracture, right shoulder: Secondary | ICD-10-CM | POA: Diagnosis not present

## 2014-10-02 DIAGNOSIS — M25562 Pain in left knee: Secondary | ICD-10-CM

## 2014-10-02 NOTE — Therapy (Signed)
[place], [name], [address] Phone: [phone]   Fax:  [phone]  Physical Therapy Treatment  Patient Details  Name: Alicia Vasquez MRN: [id number] Date of Birth: 1941/01/26 Referring Provider:  [name], MD  Encounter Date: 10/02/2014      PT End of Session - 10/02/14 1514    Visit Number 58   Number of Visits 54   Date for PT Re-Evaluation 10/10/14   PT Start Time 1364   PT Stop Time 1517   PT Time Calculation (min) 38 min      Past Medical History  Diagnosis Date  . Allergy   . Hypertension   . Hypothyroid   . Blood transfusion without reported diagnosis   . Pneumonia   . DVT (deep vein thrombosis) in pregnancy   . Breast cancer 07/12/13    invasive mammary carcinoma  . Anxiety   . Arthritis   . Radiation 11/21/13-01/07/14    Right Breast/supraclav.    Past Surgical History  Procedure Laterality Date  . Removal of teeth  04/19/2012    13 teeth removed  . Biospy      female organs  . Dilation and curettage of uterus    . Breast lumpectomy with radioactive seed localization Right 09/09/2013    Procedure: BREAST LUMPECTOMY WITH RADIOACTIVE SEED LOCALIZATION WITH AXILLARY NODE EXCISION;  Surgeon: [name], MD;  Location: [facility];  Service: General;  Laterality: Right;  . Re-excision of breast lumpectomy Right 09/24/2013    Procedure: RE-EXCISION OF RIGHT BREAST LUMPECTOMY;  Surgeon: [name], MD;  Location: [facility];  Service: General;  Laterality: Right;    There were no vitals filed for this visit.  Visit Diagnosis:  Physical deconditioning  Bilateral low back pain, with sciatica presence unspecified  Arthralgia of both knees      Subjective Assessment - 10/02/14 1442    Subjective pt states she does better in the afternoon   Currently in Pain? Yes   Pain Score 4    Pain Location Back   Pain Orientation  Right;Left   Pain Descriptors / Indicators Aching;Jabbing   Pain Score 2   Pain Location Knee   Pain Descriptors / Indicators Aching   Pain Score 3   Pain Location Shoulder   Pain Descriptors / Indicators Aching   Pain Type Chronic pain                         [facility] Adult PT Treatment/Exercise - 10/02/14 0001    High Level Balance   High Level Balance Activities Side stepping;Backward walking;Turns;Sudden stops;Head turns;Marching forwards;Figure 8 turns;Direction changes;Braiding   Elbow Exercises   Elbow Flexion Strengthening;10 reps;Both;Standing  2 #   Neck Exercises: Seated   Shoulder Shrugs 5 reps   Shoulder Rolls Backwards;5 reps   Shoulder Flexion Right;Left  3 reps with concurrent opposite hip extension   Lumbar Exercises: Supine   Ab Set 5 reps   Bent Knee Raise 10 reps   Dead Bug 5 reps   Other Supine Lumbar Exercises lower trunk rotations   Knee/Hip Exercises: Supine   Short Arc Quad Sets Strengthening;Both;10 reps;Other (comment);2 sets  5#   Shoulder Exercises: Standing   Flexion Strengthening;5 reps  2 #   Shoulder Exercises: Pulleys   Flexion 2 minutes   ABduction 2 minutes  Short Term Clinic Goals - 10/02/14 1512    CC Short Term Goal  #2   Title Increase Berg Balance Assessment score to 40/56   Status On-going             Long Term Clinic Goals - 10/02/14 1512    CC Long Term Goal  #1   Title independence in upgraded  home exercise program to include balance activities   Status On-going   CC Long Term Goal  #2   Title Increase Berg Balance Assessment score to 40/56   Status On-going   CC Long Term Goal  #3   Title Quick Dash assessment will be less than 40    Status On-going   CC Long Term Goal  #4   Title verbalize a plan to continue with community based exercise program   Status On-going   CC Long Term Goal  #5   Title pt will report that she is familiar with exercise equipment and ready  to transition to [facility] progam    Status Not Met            Plan - 10/02/14 1500    Clinical Impression Statement pt seems to be doing better with gait and balance activites.  She reports dizzines and back pain with exertion. but recovers with  a rest break Pt able to intiate more exrcise in supine for core activation and LE exercise   PT Next Visit Plan  continue with advanced gait techniques, strengthening upgrade home program to include balance activities including trunk rotation         Problem List Patient Active Problem List   Diagnosis Date Noted  . Arm edema 08/28/2014  . Vitamin D deficiency 04/23/2014  . Hyperparathyroidism 04/23/2014  . Depression 07/18/2013  . Breast cancer of upper-outer quadrant of right female breast 07/15/2013  . Encounter for therapeutic drug monitoring 07/05/2013  . Overactive bladder 01/30/2013  . DVT (deep venous thrombosis), left 12/19/2012  . HTN (hypertension) 09/27/2011  . Hearing loss 09/27/2011  . Chest wall pain 09/27/2011  . Seasonal allergies 09/27/2011  . SOB (shortness of breath), related to deconditioning 08/26/2011  . Hypothyroid 08/26/2011   [name]. Owens Shark, PT  10/02/2014, 5:10 PM  [place], [name], [address] Phone: [phone]   Fax:  [phone]

## 2014-10-07 ENCOUNTER — Ambulatory Visit: Payer: Medicare Other | Admitting: Physical Therapy

## 2014-10-07 DIAGNOSIS — C50911 Malignant neoplasm of unspecified site of right female breast: Secondary | ICD-10-CM | POA: Diagnosis not present

## 2014-10-07 DIAGNOSIS — I890 Lymphedema, not elsewhere classified: Secondary | ICD-10-CM | POA: Diagnosis not present

## 2014-10-07 DIAGNOSIS — M24511 Contracture, right shoulder: Secondary | ICD-10-CM | POA: Diagnosis not present

## 2014-10-07 DIAGNOSIS — M545 Low back pain: Secondary | ICD-10-CM

## 2014-10-07 DIAGNOSIS — M549 Dorsalgia, unspecified: Secondary | ICD-10-CM | POA: Diagnosis not present

## 2014-10-07 DIAGNOSIS — R5381 Other malaise: Secondary | ICD-10-CM

## 2014-10-07 DIAGNOSIS — M25562 Pain in left knee: Secondary | ICD-10-CM

## 2014-10-07 DIAGNOSIS — Z9889 Other specified postprocedural states: Secondary | ICD-10-CM | POA: Diagnosis not present

## 2014-10-07 DIAGNOSIS — M25511 Pain in right shoulder: Secondary | ICD-10-CM

## 2014-10-07 DIAGNOSIS — M542 Cervicalgia: Secondary | ICD-10-CM | POA: Diagnosis not present

## 2014-10-07 DIAGNOSIS — M25561 Pain in right knee: Secondary | ICD-10-CM

## 2014-10-07 NOTE — Patient Instructions (Signed)
Pt states she has not decided on a community exercise program yet.  Previously presented options were reviewed

## 2014-10-07 NOTE — Therapy (Signed)
[place], [name], [address] Phone: [phone]   Fax:  [phone]  Physical Therapy Treatment  Patient Details  Name: Alicia Vasquez MRN: [id number] Date of Birth: 01/27/1941 Referring Provider:  [name], MD  Encounter Date: 10/07/2014      PT End of Session - 10/07/14 1524    Visit Number 86   Number of Visits 10   Date for PT Re-Evaluation 10/10/14   PT Start Time 0623   PT Stop Time 1520   PT Time Calculation (min) 39 min      Past Medical History  Diagnosis Date  . Allergy   . Hypertension   . Hypothyroid   . Blood transfusion without reported diagnosis   . Pneumonia   . DVT (deep vein thrombosis) in pregnancy   . Breast cancer 07/12/13    invasive mammary carcinoma  . Anxiety   . Arthritis   . Radiation 11/21/13-01/07/14    Right Breast/supraclav.    Past Surgical History  Procedure Laterality Date  . Removal of teeth  04/19/2012    13 teeth removed  . Biospy      female organs  . Dilation and curettage of uterus    . Breast lumpectomy with radioactive seed localization Right 09/09/2013    Procedure: BREAST LUMPECTOMY WITH RADIOACTIVE SEED LOCALIZATION WITH AXILLARY NODE EXCISION;  Surgeon: [name], MD;  Location: [facility];  Service: General;  Laterality: Right;  . Re-excision of breast lumpectomy Right 09/24/2013    Procedure: RE-EXCISION OF RIGHT BREAST LUMPECTOMY;  Surgeon: [name], MD;  Location: [place] [facility];  Service: General;  Laterality: Right;    There were no vitals filed for this visit.  Visit Diagnosis:  Physical deconditioning  Bilateral low back pain, with sciatica presence unspecified  Arthralgia of both knees  Pain in joint, shoulder region, right      Subjective Assessment - 10/07/14 1442    Subjective pt comes in today, stating she had to "load up on tyelonol" before she came.  She has back,  shoulder and knee.  She has been doing housework:cleaning floors and pulling weeds but she had to take frequent rest breaks.  She  states: "that did me in"  She says she is doing her exrecise program at home . She c/o fatigue and that she is unable to stand for any length of time..She reports shortness of breath with activity.    Patient Stated Goals pt wants to be more active physically and to be able to walk a long distance without getting so short of breath.   Currently in Pain? Yes   Pain Location Back   Pain Orientation Right;Left   Pain Descriptors / Indicators Sharp  when she bends over   Pain Type Chronic pain   Pain Score 2   Pain Location Knee   Pain Orientation Left   Pain Descriptors / Indicators Aching;Throbbing   Pain Type Chronic pain   Pain Score 4   Pain Location Shoulder   Pain Orientation Right   Pain Descriptors / Indicators Aching;Throbbing            [facility] PT Assessment - 10/07/14 0001    Berg Balance Test   Sit to Stand Able to stand without using hands and stabilize independently   Standing Unsupported Able to stand safely 2 minutes   Sitting with Back Unsupported but Feet Supported on Floor or Stool Able to sit safely and securely  2 minutes   Stand to Sit Sits safely with minimal use of hands   Transfers Able to transfer safely, minor use of hands   Standing Unsupported with Eyes Closed Able to stand 10 seconds safely   Standing Ubsupported with Feet Together Able to place feet together independently and stand 1 minute safely   From Standing, Reach Forward with Outstretched Arm Can reach forward >12 cm safely (5")   From Standing Position, Pick up Object from Floor Able to pick up shoe safely and easily   From Standing Position, Turn to Look Behind Over each Shoulder Looks behind one side only/other side shows less weight shift   Turn 360 Degrees Able to turn 360 degrees safely but slowly   Standing Unsupported, Alternately Place Feet on Step/Stool Able to  stand independently and complete 8 steps >20 seconds   Standing Unsupported, One Foot in Front Able to take small step independently and hold 30 seconds   Standing on One Leg Tries to lift leg/unable to hold 3 seconds but remains standing independently   Total Score 46              Quick Dash - 10/07/14 0001    Open a tight or new jar No difficulty   Do heavy household chores (wash walls, wash floors) Moderate difficulty   Carry a shopping bag or briefcase No difficulty   Wash your back Mild difficulty   Use a knife to cut food No difficulty   Recreational activities in which you take some force or impact through your arm, shoulder, or hand (golf, hammering, tennis) Moderate difficulty   During the past week, to what extent has your arm, shoulder or hand problem interfered with your normal social activities with family, friends, neighbors, or groups? Not at all   During the past week, to what extent has your arm, shoulder or hand problem limited your work or other regular daily activities Slightly   Arm, shoulder, or hand pain. Moderate   Tingling (pins and needles) in your arm, shoulder, or hand None   Difficulty Sleeping Mild difficulty   DASH Score 20.45 %              PT Education - 10/07/14 1530    Education provided Yes   Education Details ecnouragement to decided on community exercise program and to stay active   Person(s) Educated Patient   Methods Explanation   Comprehension Verbalized understanding           Short Term Clinic Goals - 10/07/14 1503    CC Short Term Goal  #1   Title pt will report pain in low back at 2/10   Baseline --  varies at times   Status Achieved   CC Short Term Goal  #2   Title Increase Berg Balance Assessment score to 40/56   Status Achieved             Long Term Clinic Goals - 10/07/14 1459    CC Long Term Goal  #1   Title independence in upgraded  home exercise program to include balance activities   Status  Achieved   CC Long Term Goal  #2   Title Increase Berg Balance Assessment score to 40/56  score on 10/07/2014 is 44/56   Baseline 33/56   Status Achieved   CC Long Term Goal  #3   Title Quick Dash assessment will be less than 40    Status Achieved   CC Long Term Goal  #  4   Title verbalize a plan to continue with community based exercise program  pt states she does not have a plan yet   Status Not Met   CC Long Term Goal  #5   Title pt will report that she is familiar with exercise equipment and ready to transition to [facility] progam    Status Not Met            Plan - 10/07/14 1530    Clinical Impression Statement Ms. Pawloski has improved in [place] and DASH score, but continues to be limited by c/o pain, fatigue and shortness of breath that have not shown significant improvement with  physical therapy. She is ready to discharge from physical therapy after one more session to finalize home program.    Pt will benefit from skilled therapeutic intervention in order to improve on the following deficits Decreased strength;Difficulty walking;Pain;Decreased activity tolerance;Decreased endurance;Decreased balance;Impaired perceived functional ability   Rehab Potential Good   Clinical Impairments Affecting Rehab Potential medical issues with parathyroid and coumadin regulation, arthritis in knees and back, effects of cancer surgery and radiation.   PT Frequency 2x / week   PT Duration 8 weeks   PT Next Visit Plan  Finalize home program   Consulted and Agree with Plan of Care Patient        Problem List Patient Active Problem List   Diagnosis Date Noted  . Arm edema 08/28/2014  . Vitamin D deficiency 04/23/2014  . Hyperparathyroidism 04/23/2014  . Depression 07/18/2013  . Breast cancer of upper-outer quadrant of right female breast 07/15/2013  . Encounter for therapeutic drug monitoring 07/05/2013  . Overactive bladder 01/30/2013  . DVT (deep venous thrombosis), left  12/19/2012  . HTN (hypertension) 09/27/2011  . Hearing loss 09/27/2011  . Chest wall pain 09/27/2011  . Seasonal allergies 09/27/2011  . SOB (shortness of breath), related to deconditioning 08/26/2011  . Hypothyroid 08/26/2011   [name]. Owens Shark, PT  10/07/2014, 3:36 PM  [place], [name], [address] Phone: [phone]   Fax:  [phone]

## 2014-10-09 ENCOUNTER — Encounter: Payer: Self-pay | Admitting: Family Medicine

## 2014-10-09 ENCOUNTER — Ambulatory Visit (INDEPENDENT_AMBULATORY_CARE_PROVIDER_SITE_OTHER): Payer: Medicare Other | Admitting: Family Medicine

## 2014-10-09 VITALS — BP 146/80 | HR 74 | Temp 97.5°F | Resp 16 | Ht 63.5 in | Wt 228.0 lb

## 2014-10-09 DIAGNOSIS — C50911 Malignant neoplasm of unspecified site of right female breast: Secondary | ICD-10-CM

## 2014-10-09 DIAGNOSIS — I82409 Acute embolism and thrombosis of unspecified deep veins of unspecified lower extremity: Secondary | ICD-10-CM | POA: Diagnosis not present

## 2014-10-09 DIAGNOSIS — R350 Frequency of micturition: Secondary | ICD-10-CM

## 2014-10-09 DIAGNOSIS — R0789 Other chest pain: Secondary | ICD-10-CM

## 2014-10-09 DIAGNOSIS — R29898 Other symptoms and signs involving the musculoskeletal system: Secondary | ICD-10-CM

## 2014-10-09 LAB — POCT URINALYSIS DIPSTICK
Bilirubin, UA: NEGATIVE
Blood, UA: NEGATIVE
Glucose, UA: NEGATIVE
Ketones, UA: NEGATIVE
Nitrite, UA: NEGATIVE
Protein, UA: NEGATIVE
Spec Grav, UA: <=1.005
Urobilinogen, UA: 0.2
pH, UA: 5.5

## 2014-10-09 LAB — POCT UA - MICROSCOPIC ONLY
Casts, Ur, LPF, POC: NEGATIVE
Crystals, Ur, HPF, POC: NEGATIVE
Mucus, UA: NEGATIVE
RBC, urine, microscopic: NEGATIVE
Yeast, UA: NEGATIVE

## 2014-10-09 MED ORDER — OXYCODONE HCL 5 MG PO TABS: 5.0000 mg | ORAL_TABLET | Freq: Four times a day (QID) | ORAL | Status: DC | PRN

## 2014-10-09 NOTE — Patient Instructions (Signed)
Return in 2 weeks for next protime.  Come to [address]

## 2014-10-09 NOTE — Progress Notes (Signed)
Subjective:    Patient ID: Alicia Vasquez, female    DOB: [birth year], 74 y.o.   MRN: [id number] This chart was scribed for Alicia Haber, MD by Leandra Kern, Medical Scribe. This patient was seen in Room 27 and the patient's care was started at 2:30 PM.  HPI HPI Comments: Alicia Vasquez is a 74 y.o. female with a history of breast cancer of the right upper quadrant of the right female breast-being recently treated by radiation, PNA, hyperparthyrodism, SOB related to deconditioning, and UTI, who presents to [facility] for her two month follow-up.  She reports yesterday she was sitting down, and she suddenly had near-syncope episodes twice, which sometimes occur if she does not eat at the right time. Patient also complains of increasing urinary frequency. Additionally, pt reports chest still being sore and gradual increase in  "puffiness", as well as numbness to the upper right arm. Pt has symptoms of shortness of breaths, hoarse sounding voice. Coughs secondary to PNA have resolved since last time. Patient states that she can drive now. She reports taking pain medication once at night time, and would like to get a refill for that. She had blood work done about three weeks ago, taken from the left arm, which resulted in permanent swelling. She indicates that she has an appointment next month with endodontologics to get her thyroid checked.   Review of Systems  HENT: Positive for voice change (hoarse sounding voice).   Respiratory: Positive for shortness of breath. Negative for cough.   Neurological: Positive for syncope (near-syncope), weakness (sudden ) and numbness (to the right upper arm ).      Objective:   Physical Exam  Constitutional: She is oriented to person, place, and time. She appears well-developed and well-nourished. No distress.  HENT:  Head: Normocephalic and atraumatic.  Mouth/Throat: Oropharynx is clear and moist. No oropharyngeal exudate.  Eyes:  Pupils are equal, round, and reactive to light.  Neck: Neck supple.  Cardiovascular: Normal rate, regular rhythm and normal heart sounds.   No murmur heard. Pulmonary/Chest: Effort normal and breath sounds normal. No respiratory distress. She has no wheezes. She has no rales.  Mild STS right upper chest Mild hoarseness  Musculoskeletal: She exhibits no edema.  Neurological: She is alert and oriented to person, place, and time. No cranial nerve deficit.  Skin: Skin is warm and dry. No rash noted.  Psychiatric: She has a normal mood and affect. Her behavior is normal.  Nursing note and vitals reviewed.  Results for orders placed or performed in visit on 10/09/14  POCT UA - Microscopic Only  Result Value Ref Range   WBC, Ur, HPF, POC 2-3    RBC, urine, microscopic negative    Bacteria, U Microscopic 1+    Mucus, UA Negative    Epithelial cells, urine per micros 7-9    Crystals, Ur, HPF, POC negative    Casts, Ur, LPF, POC Negative    Yeast, UA Negative   POCT urinalysis dipstick  Result Value Ref Range   Color, UA Light Yellow    Clarity, UA Clear    Glucose, UA Negative    Bilirubin, UA Negative    Ketones, UA Negative    Spec Grav, UA <=1.005    Blood, UA negative    pH, UA 5.5    Protein, UA negative    Urobilinogen, UA 0.2    Nitrite, UA Negative    Leukocytes, UA Trace  I spent 30 minutes face to face with patient     Assessment & Plan:   This chart was scribed in my presence and reviewed by me personally.    ICD-9-CM ICD-10-CM   1. Chest wall pain 786.52 R07.89   2. Urinary frequency 788.41 R35.0 POCT UA - Microscopic Only     POCT urinalysis dipstick     Urine culture  3. DVT (deep venous thrombosis), unspecified laterality 453.40 I82.409   4. Muscular deconditioning 781.99 R29.898 Ambulatory referral to Physical Therapy  5. Breast cancer, right 174.9 C50.911 Ambulatory referral to Physical Therapy     Signed, Alicia Haber, MD

## 2014-10-10 ENCOUNTER — Ambulatory Visit: Payer: Medicare Other | Admitting: Physical Therapy

## 2014-10-10 DIAGNOSIS — C50911 Malignant neoplasm of unspecified site of right female breast: Secondary | ICD-10-CM | POA: Diagnosis not present

## 2014-10-10 DIAGNOSIS — R5381 Other malaise: Secondary | ICD-10-CM

## 2014-10-10 DIAGNOSIS — M24511 Contracture, right shoulder: Secondary | ICD-10-CM | POA: Diagnosis not present

## 2014-10-10 DIAGNOSIS — M545 Low back pain: Secondary | ICD-10-CM

## 2014-10-10 DIAGNOSIS — M549 Dorsalgia, unspecified: Secondary | ICD-10-CM | POA: Diagnosis not present

## 2014-10-10 DIAGNOSIS — M25562 Pain in left knee: Secondary | ICD-10-CM

## 2014-10-10 DIAGNOSIS — Z9889 Other specified postprocedural states: Secondary | ICD-10-CM | POA: Diagnosis not present

## 2014-10-10 DIAGNOSIS — M25511 Pain in right shoulder: Secondary | ICD-10-CM

## 2014-10-10 DIAGNOSIS — I890 Lymphedema, not elsewhere classified: Secondary | ICD-10-CM | POA: Diagnosis not present

## 2014-10-10 DIAGNOSIS — M25561 Pain in right knee: Secondary | ICD-10-CM

## 2014-10-10 DIAGNOSIS — M542 Cervicalgia: Secondary | ICD-10-CM | POA: Diagnosis not present

## 2014-10-10 LAB — URINE CULTURE: Colony Count: 70000

## 2014-10-10 NOTE — Therapy (Signed)
[place], [name], [address] Phone: [phone]   Fax:  [phone]  Physical Therapy Treatment  Patient Details  Name: Alicia Vasquez MRN: [id number] Date of Birth: Dec 16, 1940 Referring Provider:  [name], MD  Encounter Date: 10/10/2014      PT End of Session - 10/10/14 1148    Visit Number 58   Number of Visits 59   Date for PT Re-Evaluation 10/10/14   PT Start Time 1112   PT Stop Time 1150   PT Time Calculation (min) 38 min      Past Medical History  Diagnosis Date  . Allergy   . Hypertension   . Hypothyroid   . Blood transfusion without reported diagnosis   . Pneumonia   . DVT (deep vein thrombosis) in pregnancy   . Breast cancer 07/12/13    invasive mammary carcinoma  . Anxiety   . Arthritis   . Radiation 11/21/13-01/07/14    Right Breast/supraclav.    Past Surgical History  Procedure Laterality Date  . Removal of teeth  04/19/2012    13 teeth removed  . Biospy      female organs  . Dilation and curettage of uterus    . Breast lumpectomy with radioactive seed localization Right 09/09/2013    Procedure: BREAST LUMPECTOMY WITH RADIOACTIVE SEED LOCALIZATION WITH AXILLARY NODE EXCISION;  Surgeon: [name], MD;  Location: [facility];  Service: General;  Laterality: Right;  . Re-excision of breast lumpectomy Right 09/24/2013    Procedure: RE-EXCISION OF RIGHT BREAST LUMPECTOMY;  Surgeon: [name], MD;  Location: [facility];  Service: General;  Laterality: Right;    There were no vitals filed for this visit.  Visit Diagnosis:  Physical deconditioning  Bilateral low back pain, with sciatica presence unspecified  Arthralgia of both knees  Pain in joint, shoulder region, right      Subjective Assessment - 10/10/14 1121    Subjective Pt had appointment with Dr. Joseph Art yesterday. He advised her to continue with exercise. She  will go back to see him in 2 weeks. Pt states she thnks she knows the exercises she should do. She says she is afraid to walk outside because she is afraid of falling. She has not had time to decided on a community exericise program and is not sure what she wants to do . She has all the information to help make her decision.   Currently in Pain? Yes   Pain Score 5    Pain Location Back   Pain Orientation Right;Left   Pain Descriptors / Indicators Sharp   Pain Type Chronic pain   Pain Radiating Towards down her leg    Pain Onset More than a month ago   Pain Frequency Intermittent   Aggravating Factors  worse when she gets up in the morning   Pain Relieving Factors medicatoin    Effect of Pain on Daily Activities makes everything harder   Pain Score 4   Pain Location Knee   Pain Orientation Left   Pain Descriptors / Indicators Sharp   Pain Type Chronic pain   Pain Radiating Towards down to her feet and up her back    Pain Frequency Intermittent   Pain Score 3   Pain Location Shoulder   Pain Orientation Right   Pain Descriptors / Indicators Aching  radiates from shoulder to lower back    Pain Type Chronic pain   Pain Frequency Intermittent  [facility] Treatment/Exercise - Nov 04, 2014 0001    High Level Balance   High Level Balance Activities Side stepping  for 3 minutes    Neck Exercises: Seated   Cervical Rotation Both   Lateral Flexion Both;5 reps   Shoulder Shrugs 5 reps   Shoulder Rolls Backwards;5 reps   Knee/Hip Exercises: Aerobic   Isokinetic nustep 15 mn at level 4   Knee/Hip Exercises: Standing   Other Standing Knee Exercises standing hip aduction, adduction hip flexion, hip extension x 10 reps    Shoulder Exercises: Standing   Other Standing Exercises reaching up cabinet   Ankle Exercises: Seated   Heel Raises 10 reps                   Short Term Clinic Goals - 2014/11/04 1154    CC Short Term Goal  #1   Title  pt will report pain in low back at 2/10  varies, but has been as low at 2/10 at one point   Status Achieved   CC Short Term Goal  #2   Title Increase Berg Balance Assessment score to 40/56   Status Achieved             Long Term Clinic Goals - Nov 04, 2014 1144    CC Long Term Goal  #1   Title independence in upgraded  home exercise program to include balance activities   Status Achieved   CC Long Term Goal  #2   Title Increase Berg Balance Assessment score to 40/56   Baseline 33/56   Status Achieved   CC Long Term Goal  #3   Title Quick Dash assessment will be less than 40    Status Achieved   CC Long Term Goal  #4   Title verbalize a plan to continue with community based exercise program  not decided   Status Not Met   CC Long Term Goal  #5   Title pt will report that she is familiar with exercise equipment and ready to transition to [employer] progam    Status Deferred            Plan - November 04, 2014 1150    Clinical Impression Statement Goals reassessed and patient has acheived all except for plan to continue at community exercise program, She has all information, but has not made a decision. She was able to initate home exercises to do in standing and sitting with minimal prompting and says she has her written sheets at home if she needs them.  Encouraged pattient  that I feel she is ready to conitnue with her own self care and exrcise on her own. Ready for discharge from this episode.   PT Next Visit Plan Discharge          G-Codes - 04-Nov-2014 1155    Functional Assessment Tool Used clinical judgement   Functional Limitation Other PT primary   Other PT Primary Goal Status (W0981) At least 20 percent but less than 40 percent impaired, limited or restricted   Other PT Primary Discharge Status (X9147) At least 20 percent but less than 40 percent impaired, limited or restricted      Problem List Patient Active Problem List   Diagnosis Date Noted  . Arm edema 08/28/2014  .  Vitamin D deficiency 04/23/2014  . Hyperparathyroidism 04/23/2014  . Depression 07/18/2013  . Breast cancer of upper-outer quadrant of right female breast 07/15/2013  . Encounter for therapeutic drug monitoring 07/05/2013  . Overactive bladder  01/30/2013  . DVT (deep venous thrombosis), left 12/19/2012  . HTN (hypertension) 09/27/2011  . Hearing loss 09/27/2011  . Chest wall pain 09/27/2011  . Seasonal allergies 09/27/2011  . SOB (shortness of breath), related to deconditioning 08/26/2011  . Hypothyroid 08/26/2011   PHYSICAL THERAPY DISCHARGE SUMMARY  Visits from Start of Care: 77  Current functional level related to goals / functional outcomes: Pt is independent in self care   Remaining deficits: Chronic pain, balance impairment with need to walk with cane   Education / Equipment: Home exercise and community exercise programsn Plan: Patient agrees to discharge.  Patient goals were partially met. Patient is being discharged due to lack of progress.in pain control and reduction in shortness of breath with activity????         [name]. Owens Shark, PT   10/10/2014, 12:06 PM  [place], [name], [address] Phone: [phone]   Fax:  [phone]

## 2014-10-11 ENCOUNTER — Other Ambulatory Visit: Payer: Self-pay | Admitting: Family Medicine

## 2014-10-11 DIAGNOSIS — N390 Urinary tract infection, site not specified: Secondary | ICD-10-CM

## 2014-10-11 MED ORDER — CIPROFLOXACIN HCL 250 MG PO TABS: 250.0000 mg | ORAL_TABLET | Freq: Two times a day (BID) | ORAL | Status: DC

## 2014-10-11 NOTE — Progress Notes (Signed)
Pt.notified

## 2014-10-14 ENCOUNTER — Ambulatory Visit: Payer: Medicare Other | Admitting: Internal Medicine

## 2014-10-17 ENCOUNTER — Ambulatory Visit (INDEPENDENT_AMBULATORY_CARE_PROVIDER_SITE_OTHER): Payer: Medicare Other | Admitting: Internal Medicine

## 2014-10-17 ENCOUNTER — Encounter: Payer: Self-pay | Admitting: Internal Medicine

## 2014-10-17 VITALS — BP 130/76 | HR 78 | Temp 97.6°F | Resp 12 | Wt 230.0 lb

## 2014-10-17 DIAGNOSIS — E559 Vitamin D deficiency, unspecified: Secondary | ICD-10-CM | POA: Diagnosis not present

## 2014-10-17 DIAGNOSIS — E213 Hyperparathyroidism, unspecified: Secondary | ICD-10-CM | POA: Diagnosis not present

## 2014-10-17 LAB — VITAMIN D 25 HYDROXY (VIT D DEFICIENCY, FRACTURES): VITD: 26.82 ng/mL — AB (ref 30.00–100.00)

## 2014-10-17 NOTE — Patient Instructions (Signed)
Please stop at the lab.  Please come back for a follow-up appointment in 6 months.  

## 2014-10-17 NOTE — Progress Notes (Signed)
Patient ID: Alicia Vasquez, female   DOB: January 10, 1941, 74 y.o.   MRN: [id number]   HPI  Alicia Vasquez is a 74 y.o.-year-old female, returning for follow-up for normocalcemic hyperparathyroidism and severe vitamin D deficiency, diagnosed after last visit. Last visit 6 mo ago.  Reviewed and addended hx: Pt was dx with found to have an elevated parathyroid hormone in 02/2014. She had 1x high calcium level before her breast surgery: 11 (I could not find this in the records).  I reviewed pt's pertinent labs: Lab Results  Component Value Date   PTH 88* 07/27/2014   PTH 76* 07/10/2014   PTH 75* 06/07/2014   PTH 44 04/18/2014   PTH Comment 04/18/2014   PTH 99* 03/17/2014   PTH 64.1 10/18/2012   CALCIUM 10.1 08/27/2014   CALCIUM 10.2 07/27/2014   CALCIUM 10.1 07/27/2014   CALCIUM 10.2 07/24/2014   CALCIUM 10.0 07/10/2014   CALCIUM 10.0 07/10/2014   CALCIUM 9.9 06/07/2014   CALCIUM 10.6* 06/05/2014   CALCIUM 10.0 04/18/2014   CALCIUM 10.4 04/14/2014   + previous DEXA scans - last 4-5 years ago: no osteoporosis /osteopenia  She had 1 fracture: - 1994: R foot  - 1990's: L hairline fracture   No h/o kidney stones.  No h/o CKD. Last BUN/Cr: Lab Results  Component Value Date   BUN 12.5 08/27/2014   CREATININE 0.7 08/27/2014   Pt is not on HCTZ.  Pt was taking 1000 IU vitamin D at last visit - a level was very low then >> we increase the dose to 5000 units daily. She did not return for a recheck of the level:  Component     Latest Ref Rng 04/18/2014 04/18/2014         3:04 PM  3:04 PM  Calcium     8.7 - 10.3 mg/dL  10.0  PTH     15 - 65 pg/mL  44  Vitamin D 1, 25 (OH) Total     18 - 72 pg/mL 55   Vitamin D3 1, 25 (OH)      55   Vitamin D2 1, 25 (OH)      <8   VITD     30.00 - 100.00 ng/mL 7.19 (L)   Magnesium     1.5 - 2.5 mg/dL 2.1   Phosphorus     2.3 - 4.6 mg/dL 2.8    She also has a history of breast cancer and is on Arimidex. She had 2 sx this Spring. She had  RxTx. No ChTx. She also had a L leg in 2013.   ROS: Constitutional: no weight gain, no fatigue, no hot flushes Eyes: + blurry vision, no xerophthalmia ENT: no sore throat, + nodules palpated in throat, no dysphagia/odynophagia, no hoarseness,  + hypoacusis Cardiovascular: no CP/+ SOB/no palpitations/no leg swelling Respiratory: no cough/+ SOB Gastrointestinal: no N/V/D/+ C, no heartburn Musculoskeletal: + muscle aches/+ joint aches Skin: no rashes, + easy bruising Neurological: no tremors/numbness/tingling/dizziness, + HA  I reviewed pt's medications, allergies, PMH, social hx, family hx, and changes were documented in the history of present illness. Otherwise, unchanged from my initial visit note.   Past Medical History  Diagnosis Date  . Allergy   . Hypertension   . Hypothyroid   . Blood transfusion without reported diagnosis   . Pneumonia   . DVT (deep vein thrombosis) in pregnancy   . Breast cancer 07/12/13    invasive mammary carcinoma  . Anxiety   . Arthritis   .  Radiation 11/21/13-01/07/14    Right Breast/supraclav.   Past Surgical History  Procedure Laterality Date  . Removal of teeth  04/19/2012    13 teeth removed  . Biospy      female organs  . Dilation and curettage of uterus    . Breast lumpectomy with radioactive seed localization Right 09/09/2013    Procedure: BREAST LUMPECTOMY WITH RADIOACTIVE SEED LOCALIZATION WITH AXILLARY NODE EXCISION;  Surgeon: [name], MD;  Location: [facility];  Service: General;  Laterality: Right;  . Re-excision of breast lumpectomy Right 09/24/2013    Procedure: RE-EXCISION OF RIGHT BREAST LUMPECTOMY;  Surgeon: [name], MD;  Location: [facility];  Service: General;  Laterality: Right;   History   Social History  . Marital Status: Widowed    Spouse Name: N/A    Number of Children: 1   Occupational History  . Retired Radio broadcast assistant   Social History Main Topics  . Smoking status: Never  Smoker   . Smokeless tobacco: Not on file  . Alcohol Use: No  . Drug Use: No   Social History Narrative   Exercise yard work   Current Outpatient Prescriptions on File Prior to Visit  Medication Sig Dispense Refill  . acetaminophen (TYLENOL) 500 MG tablet Take 500 mg by mouth every 6 (six) hours as needed for mild pain, fever or headache.    . anastrozole (ARIMIDEX) 1 MG tablet Take 1 tablet (1 mg total) by mouth daily. 90 tablet 4  . Cholecalciferol (VITAMIN D3) 5000 UNITS TABS Take by mouth.    . ciprofloxacin (CIPRO) 250 MG tablet Take 1 tablet (250 mg total) by mouth 2 (two) times daily. 6 tablet 0  . levothyroxine (SYNTHROID) 125 MCG tablet Take 125 mcg by mouth daily before breakfast. Dispense as written: Synthroid    . lisinopril (PRINIVIL,ZESTRIL) 10 MG tablet Take 1 tablet (10 mg total) by mouth daily. 30 tablet 11  . montelukast (SINGULAIR) 10 MG tablet Take 1 tablet (10 mg total) by mouth at bedtime. 30 tablet 3  . non-metallic deodorant (ALRA) MISC Apply 1 application topically daily as needed.    Marland Kitchen oxyCODONE (OXY IR/ROXICODONE) 5 MG immediate release tablet Take 1 tablet (5 mg total) by mouth every 6 (six) hours as needed for severe pain. 30 tablet 0  . warfarin (COUMADIN) 5 MG tablet TAKE AS DIRECTED BY ANTICOAGULATION CLINIC 60 tablet 3   No current facility-administered medications on file prior to visit.   Allergies  Allergen Reactions  . Anesthetics, Amide Other (See Comments)    ELEVATE BLOOD PRESSURE  . Benadryl [Diphenhydramine Hcl]     dizziness  . Carbocaine [Mepivacaine Hcl] Other (See Comments)    Elevated blood pressure  . Codeine Nausea Only and Other (See Comments)    dizziness  . Epinephrine Other (See Comments)    Elevated blood pressure  . Sulfa Antibiotics Other (See Comments)    dizziness  . Vicodin [Hydrocodone-Acetaminophen] Nausea Only  . Penicillins Rash   Family History  Problem Relation Age of Onset  . Cancer Brother     prostate  .  Heart disease Brother   . Colon cancer Brother    PE: BP 130/76 mmHg  Pulse 78  Temp(Src) 97.6 F (36.4 C) (Oral)  Resp 12  Wt 230 lb (104.327 kg)  SpO2 98% Body mass index is 40.1 kg/(m^2). Wt Readings from Last 3 Encounters:  10/17/14 230 lb (104.327 kg)  10/09/14 228 lb (103.42 kg)  08/27/14 228  lb (103.42 kg)   Constitutional: overweight, in NAD. No kyphosis. Eyes: PERRLA, EOMI, no exophthalmos ENT: moist mucous membranes, no thyromegaly, no cervical lymphadenopathy Cardiovascular: RRR, No MRG Respiratory: CTA B Gastrointestinal: abdomen soft, NT, ND, BS+ Musculoskeletal: no deformities, strength intact in all 4 Skin: moist, warm, no rashes Neurological: no tremor with outstretched hands, DTR normal in all 4  Assessment: 1. Normocalcemic hyperparathyroidism  2. Vitamin D deficiency - Severe    Plan: 1. Hyperparathyroidism  Patient has had normal calcium levels, and apparently a high Calcium level, at 11. An intact PTH level was also high, at 99. However, at last visit, she was found to have severe vitamin D deficiency and we increase her vitamin D supplementation from 1000 to 5000 units daily. She continues on this dose. She did not return in 2 months for repeat vitamin D level, so we'll need to check this today. Only after we repeat the vitamin D to normal, we will need to do further investigation for her hyperparathyroidism. Due to the very low level of vitamin D (7!) at last visit, I believe that her hyperparathyroidism can be secondary to this.  No apparent complications from hypercalcemia: no h/o nephrolithiasis, no osteoporosis, had 1 traumatic fracture. No abdominal pain, depression, bone pain. - If vitamin D returns low, will need to increase her vitamin D supplementation and recheck the level in 2 months. I did explain that she will need to have a lab appointment for this. - If vitamin D returns normal, we'll go ahead and check: calcium level intact PTH  ([facility]) - And if these are abnormal, then we will also check: Magnesium Phosphorus vitamin D- 25 HO and 1,25 HO  24h urinary calcium/creatinine ratio -  If the tests indicate a parathyroid adenoma, I will refer her to surgery. We  have discussed  at last visit possible consequences of hyperparathyroidism: ~1/3 pts will develop complications over 15 years (OP, nephrolithiasis). - I will see the patient back in 6 months  2. Severe vitamin D deficiency - Diagnosed at last visit  - She is on 5000 units of vitamin D daily, which we will continue today  - Check vitamin D level today   Component     Latest Ref Rng 10/17/2014  VITD     30.00 - 100.00 ng/mL 26.82 (L)   Great improvement in her vitamin D level on her current dose of 5000 units of vitamin D daily. We will continue this dose and have her back for repeat labs in 1.5-2 months. If vitamin D level returns normal then, we'll go ahead with the above investigation.

## 2014-10-22 ENCOUNTER — Ambulatory Visit: Payer: Medicare Other

## 2014-10-23 ENCOUNTER — Other Ambulatory Visit: Payer: Self-pay | Admitting: Family Medicine

## 2014-10-23 ENCOUNTER — Telehealth: Payer: Self-pay

## 2014-10-23 ENCOUNTER — Ambulatory Visit (INDEPENDENT_AMBULATORY_CARE_PROVIDER_SITE_OTHER): Payer: Medicare Other | Admitting: Family Medicine

## 2014-10-23 ENCOUNTER — Encounter: Payer: Self-pay | Admitting: Family Medicine

## 2014-10-23 VITALS — BP 129/79 | HR 80 | Temp 97.7°F | Resp 16 | Ht 64.75 in | Wt 224.6 lb

## 2014-10-23 DIAGNOSIS — I82629 Acute embolism and thrombosis of deep veins of unspecified upper extremity: Secondary | ICD-10-CM

## 2014-10-23 DIAGNOSIS — N63 Unspecified lump in unspecified breast: Secondary | ICD-10-CM

## 2014-10-23 DIAGNOSIS — N390 Urinary tract infection, site not specified: Secondary | ICD-10-CM

## 2014-10-23 DIAGNOSIS — C50911 Malignant neoplasm of unspecified site of right female breast: Secondary | ICD-10-CM

## 2014-10-23 DIAGNOSIS — R222 Localized swelling, mass and lump, trunk: Secondary | ICD-10-CM

## 2014-10-23 LAB — POCT UA - MICROSCOPIC ONLY
Casts, Ur, LPF, POC: NEGATIVE
Crystals, Ur, HPF, POC: NEGATIVE
Mucus, UA: NEGATIVE

## 2014-10-23 LAB — POCT URINALYSIS DIPSTICK
Bilirubin, UA: NEGATIVE
Blood, UA: NEGATIVE
Glucose, UA: NEGATIVE
Ketones, UA: NEGATIVE
Nitrite, UA: NEGATIVE
Protein, UA: NEGATIVE
Spec Grav, UA: 1.02
Urobilinogen, UA: 0.2
pH, UA: 6

## 2014-10-23 NOTE — Telephone Encounter (Signed)
Pt needs to have a diagnostic bilateral mammogram and [name] of right breast entered. I have no problem entering the imaging tests, but they want to know where the nodule is per o'clock. I need the nodule location before entering the tests.

## 2014-10-23 NOTE — Patient Instructions (Addendum)
Alicia Vasquez repair [phone]  Dr. [name] [phone] [email or web address]  We are setting up an ultrasound evaluation of the right chest nodule.  This seems to be most likely muscle soreness.  Let me recheck you June 13 or 14 at [address] (walk-in).

## 2014-10-23 NOTE — Progress Notes (Addendum)
° °  Subjective:    Patient ID: Alicia Vasquez, female    DOB: 04-06-1941, 74 y.o.   MRN: [id number] This chart was scribed for [name], MD by Zola Button, Medical Scribe. This patient was seen in Room 24 and the patient's care was started at 1:01 PM.   HPI HPI Comments: Alicia Vasquez is a 74 y.o. female with a hx of breast cancer, DVT (2014), and hyperparathyroidism who presents to the [facility] for a follow-up. Patient underwent radiation for breast cancer, which caused burning pain and swelling to her right chest wall. She notes that a painful knot appeared around her right breast. Patient also reports having some right arm numbness. She has not taken the Arimidex yet. She also went to see a specialist about elevated calcium levels.  Patient reports having headaches since starting warfarin. She was recently switched to another brand Ervin Knack), but she plans to go back to Coumadin. No head trauma or syncope.  She had a UTI recently.  She finished her medications and has not had any further problems with burning or frequency. Urine culture did grew out 80,000 colonies of Escherichia coli.  Review of Systems  Cardiovascular: Positive for chest pain.  Neurological: Positive for numbness and headaches.       Objective:   Physical Exam CONSTITUTIONAL: Well developed/well nourished, perhaps too much so HEAD: Normocephalic/atraumatic EYES: EOM/PERRL ENMT: Mucous membranes moist, multiple caries NECK: supple no meningeal signs SPINE: entire spine nontender CV: S1/S2 noted, no murmurs/rubs/gallops noted LUNGS: Lungs are clear to auscultation bilaterally, no apparent distress,  Tender lateral border of right pectoralis major muscle with vague 2 cm swelling contiguous with muscle ABDOMEN: soft, nontender, no rebound or guarding GU: no cva tenderness NEURO: Pt is awake/alert, moves all extremitiesx4 EXTREMITIES: pulses normal, full ROM, no edema SKIN: warm, color  normal, hyperpigmented left anterior chest. PSYCH: no abnormalities of mood noted Results for orders placed or performed in visit on 10/17/14  Vitamin D (25 hydroxy)  Result Value Ref Range   VITD 26.82 (L) 30.00 - 100.00 ng/mL    Assessment & Plan:   This chart was scribed in my presence and reviewed by me personally.    ICD-9-CM ICD-10-CM   1. Deep venous thrombosis of arm, unspecified laterality 453.82 I82.629 Protime-INR  2. Urinary tract infection without hematuria, site unspecified 599.0 N39.0 POCT urinalysis dipstick     POCT UA - Microscopic Only  3. Subcutaneous nodule of breast 611.89 N63 [name] Chest   Recheck 2-3 weeks  Signed, [name], MD

## 2014-10-24 ENCOUNTER — Encounter: Payer: Self-pay | Admitting: Family Medicine

## 2014-10-24 LAB — PROTIME-INR
INR: 2.09 — ABNORMAL HIGH (ref <1.50)
Prothrombin Time: 23.5 seconds — ABNORMAL HIGH (ref 11.6–15.2)

## 2014-10-25 ENCOUNTER — Other Ambulatory Visit: Payer: Self-pay | Admitting: Family Medicine

## 2014-10-25 LAB — URINE CULTURE: Colony Count: 75000

## 2014-10-25 MED ORDER — NITROFURANTOIN MONOHYD MACRO 100 MG PO CAPS: 100.0000 mg | ORAL_CAPSULE | Freq: Two times a day (BID) | ORAL | Status: DC

## 2014-10-30 ENCOUNTER — Telehealth: Payer: Self-pay

## 2014-10-30 NOTE — Telephone Encounter (Signed)
Martie from [facility] outpt cancer rehab called bc Dr L had reordered more rehab for pt and they are not able to provide any more rehab for pt. They recently discharged her after 30 rehab visits (middle of May) and Medicare will probably not even pay for more visits unless pt's condition changes. They have maxed out her benefit from therapy and she has declined the outside exercise programs they have recommended, ie [employer] [employer] program.

## 2014-11-07 ENCOUNTER — Telehealth: Payer: Self-pay

## 2014-11-07 DIAGNOSIS — N63 Unspecified lump in unspecified breast: Secondary | ICD-10-CM

## 2014-11-07 NOTE — Telephone Encounter (Signed)
Angelita Ingles at [facility] is requesting patients order for a [name] Chest to be changed to a diagnostic mmg since she has a nodule of breast. She stated patient just had an [name] of her right breast. If any questions please call Angelita Ingles at [phone]

## 2014-11-10 ENCOUNTER — Ambulatory Visit (INDEPENDENT_AMBULATORY_CARE_PROVIDER_SITE_OTHER): Payer: Medicare Other | Admitting: Family Medicine

## 2014-11-10 VITALS — BP 130/90 | HR 80 | Temp 97.8°F | Resp 18 | Ht 64.0 in | Wt 228.2 lb

## 2014-11-10 DIAGNOSIS — R0789 Other chest pain: Secondary | ICD-10-CM

## 2014-11-10 DIAGNOSIS — N3000 Acute cystitis without hematuria: Secondary | ICD-10-CM

## 2014-11-10 DIAGNOSIS — I82402 Acute embolism and thrombosis of unspecified deep veins of left lower extremity: Secondary | ICD-10-CM

## 2014-11-10 DIAGNOSIS — R21 Rash and other nonspecific skin eruption: Secondary | ICD-10-CM

## 2014-11-10 DIAGNOSIS — I82409 Acute embolism and thrombosis of unspecified deep veins of unspecified lower extremity: Secondary | ICD-10-CM | POA: Diagnosis not present

## 2014-11-10 LAB — POCT UA - MICROSCOPIC ONLY
Casts, Ur, LPF, POC: NEGATIVE
Crystals, Ur, HPF, POC: NEGATIVE
Yeast, UA: NEGATIVE

## 2014-11-10 LAB — POCT URINALYSIS DIPSTICK
Bilirubin, UA: NEGATIVE
Blood, UA: NEGATIVE
Glucose, UA: NEGATIVE
Ketones, UA: NEGATIVE
Nitrite, UA: NEGATIVE
Protein, UA: NEGATIVE
Spec Grav, UA: 1.01
Urobilinogen, UA: 0.2
pH, UA: 7

## 2014-11-10 MED ORDER — OXYCODONE HCL 5 MG PO TABS: 5.0000 mg | ORAL_TABLET | Freq: Four times a day (QID) | ORAL | Status: DC | PRN

## 2014-11-10 NOTE — Addendum Note (Signed)
Addended by: Lupe Carney on: 11/10/2014 06:57 PM   Modules accepted: Orders

## 2014-11-10 NOTE — Telephone Encounter (Signed)
Done

## 2014-11-10 NOTE — Progress Notes (Signed)
This 74 year old woman is recovering from a right mastectomy for breast cancer. She still is not taking her anastrozole because she's worried it will hurt her bones.  Patient comes in today She wants to have her INR checked and be followed up for her recent UTI. She's no longer having burning on urination. She's had no epistaxis or unexplained ecchymosis.  Patient does have a skin lesion on the dorsum of her right hand that she would like treated. It's been scaly and itchy area; she's had this for several months.  Patient is had 36 hours of pain in her right chest. She's not fallen and is noted no skin rash there. She has no difficulty breathing.  Objective:BP 130/90 mmHg  Pulse 80  Temp(Src) 97.8 F (36.6 C) (Oral)  Resp 18  Ht 5\' 4"  (1.626 m)  Wt 228 lb 4 oz (103.534 kg)  BMI 39.16 kg/m2  SpO2 97% Alicia Vasquez is cheerful, cooperative, moving easily in the room without her cane. HEENT: Unremarkable-oropharynx clear patient's hearing is normal Neck: Supple no adenopathy Chest: Patient still has a fleshy swelling in the right supraclavicular area which is nontender and slightly hyperpigmented. Her lungs are clear to auscultation. Examination of the right chest wall pain shows no rash, ecchymosis or bony abnormality Heart: Regular no murmur Extremities: Trace edema Skin: Patient has 0.5 cm x 4 mm lesion on the dorsum of her right hand which is scaly and thickened plaque-like. This area was treated with liquid nitrogen.   Assessment: Patient seems to be doing fairly well. Her urinary tract symptoms have cleared. We will await the findings of her INR. I've asked her to let me know if she develops a rash or worsening pain in her right chest. This point I think it's probably could she was in a funny position overnight.  This chart was scribed in my presence and reviewed by me personally.    ICD-9-CM ICD-10-CM   1. Chest wall pain 786.52 R07.89 oxyCODONE (OXY IR/ROXICODONE) 5 MG immediate release  tablet  2. DVT of lower extremity (deep venous thrombosis), left 453.40 I82.402 INR  3. Acute cystitis without hematuria 595.0 N30.00 POCT urinalysis dipstick     POCT UA - Microscopic Only     Signed, [name], MD

## 2014-11-11 LAB — PROTIME-INR
INR: 2.49 — ABNORMAL HIGH (ref <1.50)
Prothrombin Time: 26.9 seconds — ABNORMAL HIGH (ref 11.6–15.2)

## 2014-11-13 ENCOUNTER — Telehealth: Payer: Self-pay | Admitting: Physical Therapy

## 2014-11-13 NOTE — Telephone Encounter (Signed)
Follow up call to patient to see if she has occurred a major change in medical condition that would warrant another PT episode.  No answer. Left message for patient to call us back.

## 2014-11-17 ENCOUNTER — Ambulatory Visit (INDEPENDENT_AMBULATORY_CARE_PROVIDER_SITE_OTHER): Payer: Medicare Other | Admitting: General Practice

## 2014-11-18 ENCOUNTER — Telehealth: Payer: Self-pay | Admitting: Physical Therapy

## 2014-11-18 NOTE — Telephone Encounter (Signed)
Called to check back with patient as she was waiting for results at the time of last call.  She states that she continues to have pain in back and legs.  She has not followed up with any community resources for [employer].  She mentioned again the [employer] program but does not fell she is strong enough for it.  We talked about the "Arthritis Plus" aquatic therapy program at the [facility] as a potential option.  She acknowledges she has the information and needs to decide which program she want to continue with. She has not had a new problem or incident that would indicate a physical therapy re-evaluation. Her extensive visits in the last episode did not effectively control her pain and we talked about that.  She acknowledges that and realized she needs to continue community exercise in the long term  Will pass on this information to my supervisor, Leone Payor, PT

## 2014-11-27 ENCOUNTER — Telehealth: Payer: Self-pay | Admitting: Oncology

## 2014-11-27 ENCOUNTER — Ambulatory Visit (HOSPITAL_BASED_OUTPATIENT_CLINIC_OR_DEPARTMENT_OTHER): Payer: Medicare Other | Admitting: Oncology

## 2014-11-27 ENCOUNTER — Other Ambulatory Visit (HOSPITAL_BASED_OUTPATIENT_CLINIC_OR_DEPARTMENT_OTHER): Payer: Medicare Other

## 2014-11-27 VITALS — BP 131/56 | HR 73 | Temp 97.8°F | Resp 18 | Ht 64.0 in | Wt 228.6 lb

## 2014-11-27 DIAGNOSIS — C50411 Malignant neoplasm of upper-outer quadrant of right female breast: Secondary | ICD-10-CM | POA: Diagnosis not present

## 2014-11-27 DIAGNOSIS — C50919 Malignant neoplasm of unspecified site of unspecified female breast: Secondary | ICD-10-CM

## 2014-11-27 LAB — CBC WITH DIFFERENTIAL/PLATELET
BASO%: 1 % (ref 0.0–2.0)
Basophils Absolute: 0.1 10*3/uL (ref 0.0–0.1)
EOS%: 2.5 % (ref 0.0–7.0)
Eosinophils Absolute: 0.2 10*3/uL (ref 0.0–0.5)
HCT: 43.2 % (ref 34.8–46.6)
HGB: 14.5 g/dL (ref 11.6–15.9)
LYMPH%: 20.6 % (ref 14.0–49.7)
MCH: 31.6 pg (ref 25.1–34.0)
MCHC: 33.6 g/dL (ref 31.5–36.0)
MCV: 94.1 fL (ref 79.5–101.0)
MONO#: 1.1 10*3/uL — ABNORMAL HIGH (ref 0.1–0.9)
MONO%: 16.2 % — ABNORMAL HIGH (ref 0.0–14.0)
NEUT#: 4 10*3/uL (ref 1.5–6.5)
NEUT%: 59.7 % (ref 38.4–76.8)
NRBC: 0 % (ref 0–0)
PLATELETS: 213 10*3/uL (ref 145–400)
RBC: 4.59 10*6/uL (ref 3.70–5.45)
RDW: 14 % (ref 11.2–14.5)
WBC: 6.8 10*3/uL (ref 3.9–10.3)
lymph#: 1.4 10*3/uL (ref 0.9–3.3)

## 2014-11-27 LAB — COMPREHENSIVE METABOLIC PANEL (CC13)
ALBUMIN: 3.9 g/dL (ref 3.5–5.0)
ALT: 22 U/L (ref 0–55)
ANION GAP: 8 meq/L (ref 3–11)
AST: 16 U/L (ref 5–34)
Alkaline Phosphatase: 84 U/L (ref 40–150)
BUN: 15.7 mg/dL (ref 7.0–26.0)
CO2: 28 meq/L (ref 22–29)
Calcium: 10.6 mg/dL — ABNORMAL HIGH (ref 8.4–10.4)
Chloride: 104 mEq/L (ref 98–109)
Creatinine: 0.9 mg/dL (ref 0.6–1.1)
EGFR: 64 mL/min/{1.73_m2} — AB (ref >90)
GLUCOSE: 104 mg/dL (ref 70–140)
Potassium: 4.1 mEq/L (ref 3.5–5.1)
Sodium: 140 mEq/L (ref 136–145)
Total Bilirubin: 0.41 mg/dL (ref 0.20–1.20)
Total Protein: 6.8 g/dL (ref 6.4–8.3)

## 2014-11-27 MED ORDER — ANASTROZOLE 1 MG PO TABS: 1.0000 mg | ORAL_TABLET | Freq: Every day | ORAL | Status: DC

## 2014-11-27 NOTE — Telephone Encounter (Signed)
Gave avs & calendar for September & January.

## 2014-11-27 NOTE — Progress Notes (Signed)
Alicia Vasquez  [phone] [phone]     ID: Hugo OB: 16-Mar-1941  MR#: [id number]  PQZ#:[id number]  PCP: [name], MD GYN:   SU: [name] OTHER MD: Thea Silversmith, Hart Robinsons, Philemon Kingdom  CHIEF COMPLAINT: Estrogen receptor positive breast cancer CURRENT TREATMENT: To start anastrozole  BREAST CANCER HISTORY: From doctor [name]'s intake node 07/24/2013:  "74 y.o. female. Who presented with SOB and had a CT chest performed that revealed a right breast mass. Mammogram/ultrsound on 2/13 showed a mass in the 11 o'clock position in the right breast measuring 1.5 cm. Also noted was a right axillary LN measuring 1.6 cm. MRI not performed. Biopsy of mass and lymph done. 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 Lymph node + for metastatic carcinoma."  [On 09/09/2013 the patient underwent right lumpectomy and sentinel lymph node sampling. This showed ([id number] [id number]) multifocal invasive ductal carcinoma, grade 1, the largest lesion measuring 1.8 cm, the second lesion 1.2 cm. One of 4 sentinel lymph nodes was positive, with extracapsular extension. Margins were positive. HER-2 was repeated and was again negative. Further surgery 09/16/2013 obtained clear margins.  Her subsequent history is as detailed below  INTERVAL HISTORY: Kalah returns today for follow up of her breast cancer. She was supposed to have started anastrozole April 2016. However she did not do that. She felt something in her right axilla, which bothered her, and went ahead and had an ultrasound as well as mammography and physical exam at the breast Center. All that was very reassuring. Even though that was reassuring though the symptoms persisted and she never did start the anastrozole.  REVIEW OF SYSTEMS: Judythe tells me she has problems with her  parathyroid glands. Her back hurts. She has discomfort in the right axilla and sometimes it feels like she has a mass there. She also has pain in the lateral aspect of the right breast. Her energy is still down. She never recovered from the radiation. In addition she tells me she had a blood draw here involving her antecubital area which was so inflamed they thought she must have a clot. She did have a Doppler in September which was negative. She is not exercising regularly. She continues on warfarin, although under a different name, which was very confusing to her. A detailed review of systems today was otherwise stable  PAST MEDICAL HISTORY: Past Medical History  Diagnosis Date  . Allergy   . Hypertension   . Hypothyroid   . Blood transfusion without reported diagnosis   . Pneumonia   . DVT (deep vein thrombosis) in pregnancy   . Breast cancer 07/12/13    invasive mammary carcinoma  . Anxiety   . Arthritis   . Radiation 11/21/13-01/07/14    Right Breast/supraclav.    PAST SURGICAL HISTORY: Past Surgical History  Procedure Laterality Date  . Removal of teeth  04/19/2012    13 teeth removed  . Biospy      female organs  . Dilation and curettage of uterus    . Breast lumpectomy with radioactive seed localization Right 09/09/2013    Procedure: BREAST LUMPECTOMY WITH RADIOACTIVE SEED LOCALIZATION WITH AXILLARY NODE EXCISION;  Surgeon: [name], MD;  Location: [place];  Service: General;  Laterality: Right;  . Re-excision of breast lumpectomy Right 09/24/2013    Procedure: RE-EXCISION OF RIGHT BREAST LUMPECTOMY;  Surgeon: [name], MD;  Location: [place];  Service: General;  Laterality: Right;    FAMILY HISTORY Family History  Problem Relation Age of Onset  . Cancer Brother     prostate  . Heart disease Brother   . Colon cancer Brother    the patient's father died at the age of 47 after an automobile accident. The patient's mother  died at the age of 30. She was a Marine scientist here in [name] in the old [facility]. She was infected with polio and was confined to a wheelchair for a good part of her life. She eventually died of pneumonia. The patient had one brother, who died with prostate cancer. She had no sisters. There is no history of breast or ovarian cancer in the family.  GYNECOLOGIC HISTORY:  Menarche age 60, first live birth age 45, the patient is GX P1. She went through the change of life at age 33. 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 She is widowed. Currently she is staying with her friend [name], who is a retired Radio producer. The patient's son frank lives in [place]. He works an Engineer, technical sales. The patient has no grandchildren. She is a Tourist information centre manager but currently attends a [employer] with her friend [name]    ADVANCED DIRECTIVES: Not in place   HEALTH MAINTENANCE: History  Substance Use Topics  . Smoking status: Never Smoker   . Smokeless tobacco: Never Used  . Alcohol Use: No     Colonoscopy: Never  PAP:  Bone density: 10/01/2009; lowest T score -0.8  Lipid panel:  Allergies  Allergen Reactions  . Anesthetics, Amide Other (See Comments)    ELEVATE BLOOD PRESSURE  . Benadryl [Diphenhydramine Hcl]     dizziness  . Carbocaine [Mepivacaine Hcl] Other (See Comments)    Elevated blood pressure  . Codeine Nausea Only and Other (See Comments)    dizziness  . Epinephrine Other (See Comments)    Elevated blood pressure  . Sulfa Antibiotics Other (See Comments)    dizziness  . Vicodin [Hydrocodone-Acetaminophen] Nausea Only  . Penicillins Rash    Current Outpatient Prescriptions  Medication Sig Dispense Refill  . acetaminophen (TYLENOL) 500 MG tablet Take 500 mg by mouth every 6 (six) hours as needed for mild pain, fever or headache.    . anastrozole (ARIMIDEX) 1 MG tablet Take 1 tablet (1 mg total) by mouth  daily. 90 tablet 4  . Cholecalciferol (VITAMIN D3) 5000 UNITS TABS Take by mouth.    . levothyroxine (SYNTHROID) 125 MCG tablet Take 125 mcg by mouth daily before breakfast. Dispense as written: Synthroid    . lisinopril (PRINIVIL,ZESTRIL) 10 MG tablet Take 1 tablet (10 mg total) by mouth daily. 30 tablet 11  . montelukast (SINGULAIR) 10 MG tablet Take 1 tablet (10 mg total) by mouth at bedtime. 30 tablet 3  . non-metallic deodorant (ALRA) MISC Apply 1 application topically daily as needed.    Marland Kitchen oxyCODONE (OXY IR/ROXICODONE) 5 MG immediate release tablet Take 1 tablet (5 mg total) by mouth every 6 (six) hours as needed for severe pain. 30 tablet 0  . warfarin (COUMADIN) 5 MG tablet TAKE AS DIRECTED BY ANTICOAGULATION CLINIC 60 tablet 3   No current facility-administered medications for this visit.    OBJECTIVE: Older white woman in no acute distress Filed Vitals:   11/27/14 1614  BP: 131/56  Pulse: 73  Temp: 97.8 F (36.6 C)  Resp: 18  Body mass index is 39.22 kg/(m^2).    ECOG FS:1 - Symptomatic but completely ambulatory  Sclerae unicteric, pupils round and equal Oropharynx clear and moist-- no thrush or other lesions No cervical or supraclavicular adenopathy Lungs no rales or rhonchi Heart regular rate and rhythm Abd soft, obese, nontender, positive bowel sounds MSK no focal spinal tenderness, no upper extremity lymphedema Neuro: nonfocal, well oriented, appropriate affect Breasts: The right breast is status post lumpectomy and radiation. Careful palpation of the entire breast and right axilla do not show any suspicious areas. She still tenderness    LAB RESULTS:  CMP     Component Value Date/Time   NA 140 11/27/2014 1544   NA 137 07/27/2014 1257   K 4.1 11/27/2014 1544   K 4.1 07/27/2014 1257   CL 102 07/27/2014 1257   CO2 28 11/27/2014 1544   CO2 26 07/27/2014 1257   GLUCOSE 104 11/27/2014 1544   GLUCOSE 100* 07/27/2014 1257   BUN 15.7 11/27/2014 1544   BUN 12  07/27/2014 1257   CREATININE 0.9 11/27/2014 1544   CREATININE 0.75 07/27/2014 1257   CREATININE 0.77 09/05/2013 1330   CALCIUM 10.6* 11/27/2014 1544   CALCIUM 10.2 07/27/2014 1257   CALCIUM 10.1 07/27/2014 1257   PROT 6.8 11/27/2014 1544   PROT 7.2 07/27/2014 1257   ALBUMIN 3.9 11/27/2014 1544   ALBUMIN 4.3 07/27/2014 1257   AST 16 11/27/2014 1544   AST 17 07/27/2014 1257   ALT 22 11/27/2014 1544   ALT 23 07/27/2014 1257   ALKPHOS 84 11/27/2014 1544   ALKPHOS 79 07/27/2014 1257   BILITOT 0.41 11/27/2014 1544   BILITOT 0.5 07/27/2014 1257   GFRNONAA 86 04/14/2014 1126   GFRNONAA 81* 09/05/2013 1330   GFRAA >89 04/14/2014 1126   GFRAA >90 09/05/2013 1330    I No results found for: SPEP  Lab Results  Component Value Date   WBC 6.8 11/27/2014   NEUTROABS 4.0 11/27/2014   HGB 14.5 11/27/2014   HCT 43.2 11/27/2014   MCV 94.1 11/27/2014   PLT 213 11/27/2014      Chemistry      Component Value Date/Time   NA 140 11/27/2014 1544   NA 137 07/27/2014 1257   K 4.1 11/27/2014 1544   K 4.1 07/27/2014 1257   CL 102 07/27/2014 1257   CO2 28 11/27/2014 1544   CO2 26 07/27/2014 1257   BUN 15.7 11/27/2014 1544   BUN 12 07/27/2014 1257   CREATININE 0.9 11/27/2014 1544   CREATININE 0.75 07/27/2014 1257   CREATININE 0.77 09/05/2013 1330      Component Value Date/Time   CALCIUM 10.6* 11/27/2014 1544   CALCIUM 10.2 07/27/2014 1257   CALCIUM 10.1 07/27/2014 1257   ALKPHOS 84 11/27/2014 1544   ALKPHOS 79 07/27/2014 1257   AST 16 11/27/2014 1544   AST 17 07/27/2014 1257   ALT 22 11/27/2014 1544   ALT 23 07/27/2014 1257   BILITOT 0.41 11/27/2014 1544   BILITOT 0.5 07/27/2014 1257       No results found for: LABCA2  No components found for: WCHEN277  No results for input(s): INR in the last 168 hours.  Urinalysis    Component Value Date/Time   BILIRUBINUR neg 11/10/2014 1900   PROTEINUR neg 11/10/2014 1900   UROBILINOGEN 0.2 11/10/2014 1900   NITRITE neg  11/10/2014 1900   LEUKOCYTESUR Trace* 11/10/2014 1900    STUDIES: CLINICAL DATA: Right lumpectomy April 2015. Patient feels nodularity and tenderness high on  the right chest wall and right inframammary fold region.  EXAM: DIGITAL DIAGNOSTIC BILATERAL MAMMOGRAM WITH 3D TOMOSYNTHESIS WITH CAD  ULTRASOUND RIGHT BREAST  COMPARISON: 09/05/2013 and earlier  ACR Breast Density Category c: The breast tissue is heterogeneously dense, which may obscure small masses.  FINDINGS: Postoperative changes are seen in the right breast. There is right skin and trabecular thickening following radiation therapy. Left breast is negative.  Mammographic images were processed with CAD.  On physical exam, I palpate soft thickening high on the right chest wall in the area of patient's concern. I palpate thickening without mass in the inframammary fold region of the right breast.  Targeted ultrasound is performed, showing normal appearing fibroglandular tissue high on the right chest wall on the area of patient's concern. There is skin thickening without mass in the inframammary fold region.  IMPRESSION: 1. Expected postoperative and post treatment changes in the right breast. 2. No mammographic for sonographic evidence for malignancy in either breast. 3. No suspicious imaging findings in the areas of patient's concern high on the right chest wall or inframammary fold region.  RECOMMENDATION: Diagnostic mammogram is suggested in 1 year. (Code:DM-B-01Y)  I have discussed the findings and recommendations with the patient. Results were also provided in writing at the conclusion of the visit. If applicable, a reminder letter will be sent to the patient regarding the next appointment.  BI-RADS CATEGORY 2: Benign.   Electronically Signed  By: Nolon Nations M.D.  On: 09/03/2014 16:31  ASSESSMENT: 74 y.o. [place] woman status post right lumpectomy and sentinel lymph  node sampling 09/09/2013 for an mpT1c pN1a, stage IIA invasive ductal carcinoma, estrogen and progesterone receptor both 100% positive with strong staining intensity, MIB-1 of 17% and no HER-2 amplification  (1) additional surgery for margin clearance 09/16/2013 obtained negative margins  (2) Oncotype DX recurrence score of 4 predicts a risk of outside the breast recurrence within 10 years of 7% if the patient's only systemic therapy is tamoxifen for 5 years. It also predicts no benefit from chemotherapy  (3) additional right breast surgery for margin clearance was successful, 09/16/2048  (4) adjuvant radiation completed 01/07/2014  (5) anastrozole started 02/27/2014 stopped within 2 weeks because of arm swelling. To restart on 08/29/14; bone density may 2011 was normal  PLAN: Shereece is still recovering from her surgery and radiation. I reassured her that the symptoms she is experiencing are very normal at this point. A few months from now her energy will be more completely back area however the discomfort she is feeling in her right axilla and the lateral right breast may persist indefinitely.  Quite aside from those issues she should get started on the anastrozole. We again reviewed the possible toxicities side effects and complications of that agent. She understands it basically cuts in half the risk of breast cancer recurrence. We also discussed the mechanism of action and so I reassured her that this does not interfere with any of the other medications she is on.  She is can see [name] again in 3 and then again in 6 months. After that we may begin to broaden the follow-up interval.  She has a good understanding of the overall plan. She agrees with it. She knows the goal of treatment in her case is cure. She will call with any problems that may develop before her next visit here.  Chauncey Cruel, MD   11/27/2014 4:29 PM

## 2014-11-27 NOTE — Addendum Note (Signed)
Addended by: Prentiss Bells on: 11/27/2014 05:45 PM   Modules accepted: Medications

## 2014-12-02 ENCOUNTER — Telehealth: Payer: Self-pay | Admitting: *Deleted

## 2014-12-02 ENCOUNTER — Other Ambulatory Visit: Payer: Self-pay | Admitting: *Deleted

## 2014-12-02 DIAGNOSIS — C50411 Malignant neoplasm of upper-outer quadrant of right female breast: Secondary | ICD-10-CM

## 2014-12-02 DIAGNOSIS — I82402 Acute embolism and thrombosis of unspecified deep veins of left lower extremity: Secondary | ICD-10-CM

## 2014-12-02 NOTE — Telephone Encounter (Signed)
This RN per MD review with referral for [facility] coumadin clinic request by pt - attempted to contact pt.  Obtained number verified VM- message left informing pt above referral is being placed- please coordinate appropriately per known next INR appt .  Referral and POF placed.

## 2014-12-03 ENCOUNTER — Telehealth: Payer: Self-pay | Admitting: Pharmacist

## 2014-12-03 DIAGNOSIS — I82402 Acute embolism and thrombosis of unspecified deep veins of left lower extremity: Secondary | ICD-10-CM

## 2014-12-03 NOTE — Telephone Encounter (Signed)
LVM with patient as she is a new CC patient. She has been on Coumadin for a while, 7.5mg  daily with 10 mg on Sun/Tue Last INR=2.49 on 11/10/14 She is due for her next one mid-July

## 2014-12-19 ENCOUNTER — Other Ambulatory Visit (INDEPENDENT_AMBULATORY_CARE_PROVIDER_SITE_OTHER): Payer: Medicare Other

## 2014-12-19 ENCOUNTER — Telehealth: Payer: Self-pay | Admitting: *Deleted

## 2014-12-19 ENCOUNTER — Other Ambulatory Visit: Payer: Self-pay | Admitting: Internal Medicine

## 2014-12-19 DIAGNOSIS — E213 Hyperparathyroidism, unspecified: Secondary | ICD-10-CM

## 2014-12-19 DIAGNOSIS — E559 Vitamin D deficiency, unspecified: Secondary | ICD-10-CM

## 2014-12-19 LAB — VITAMIN D 25 HYDROXY (VIT D DEFICIENCY, FRACTURES): VITD: 30.58 ng/mL (ref 30.00–100.00)

## 2014-12-19 NOTE — Telephone Encounter (Signed)
Pt has lab appt today. Please advise if Vit D is the only labs that need to be drawn. Thank you.

## 2014-12-19 NOTE — Telephone Encounter (Signed)
CALLED VAL AND GAVE HER PT.'S PHONE NUMBER.

## 2014-12-19 NOTE — Telephone Encounter (Signed)
I also added a PTH + Ca ([facility]!) as I expect her vit D level to be normal.

## 2014-12-20 LAB — PTH, INTACT AND CALCIUM
Calcium: 10.1 mg/dL (ref 8.7–10.3)
PTH: 37 pg/mL (ref 15–65)

## 2014-12-22 ENCOUNTER — Telehealth: Payer: Self-pay

## 2014-12-22 ENCOUNTER — Encounter (INDEPENDENT_AMBULATORY_CARE_PROVIDER_SITE_OTHER): Payer: Self-pay

## 2014-12-22 NOTE — Telephone Encounter (Signed)
Left pt a VM requesting she call back for her results.

## 2014-12-22 NOTE — Telephone Encounter (Signed)
-----   Message from Philemon Kingdom, MD sent at 12/22/2014  7:53 AM EDT ----- Larene Beach, can you please call pt: after normalization of her vit D level, the calcium and PTH levels are normal. No need for parathyroid surgery for now. We will need to continue to monitor her. Let's schedule another appointment in 6 months (January). Continue vitamin D 5000 units daily.

## 2014-12-23 ENCOUNTER — Encounter: Payer: Self-pay | Admitting: *Deleted

## 2014-12-31 ENCOUNTER — Telehealth: Payer: Self-pay | Admitting: Pharmacist

## 2014-12-31 NOTE — Telephone Encounter (Signed)
Alicia Vasquez called to make an initial appt with the Coumadin clinic. I explained that the [facility] Coumadin clinic is closing and spoke with Dr. Jana Hakim. He would like her to continue to have her anticoagulation managed at [facility] where she is already established with their Coumadin clinic. She states understanding. I told her that if she needs assistance making an appt with [facility] to please call back and we would help her to do that.

## 2015-01-06 ENCOUNTER — Ambulatory Visit (INDEPENDENT_AMBULATORY_CARE_PROVIDER_SITE_OTHER): Payer: Medicare Other | Admitting: Pharmacist

## 2015-01-06 DIAGNOSIS — I82402 Acute embolism and thrombosis of unspecified deep veins of left lower extremity: Secondary | ICD-10-CM

## 2015-01-06 DIAGNOSIS — C50411 Malignant neoplasm of upper-outer quadrant of right female breast: Secondary | ICD-10-CM

## 2015-01-06 LAB — POCT INR: INR: 2.8

## 2015-01-06 NOTE — Patient Instructions (Signed)
You have an appointment with Alicia Po, NP at the [facility] office on Monday August 22 at 3:00pm.  Please arrive 15 minutes prior to your appointment.  They will mail you a packet of information to complete prior to your visit.

## 2015-01-14 ENCOUNTER — Other Ambulatory Visit: Payer: Self-pay | Admitting: Family Medicine

## 2015-01-19 ENCOUNTER — Other Ambulatory Visit (INDEPENDENT_AMBULATORY_CARE_PROVIDER_SITE_OTHER): Payer: Medicare Other

## 2015-01-19 ENCOUNTER — Encounter: Payer: Self-pay | Admitting: Family

## 2015-01-19 ENCOUNTER — Telehealth: Payer: Self-pay | Admitting: Family

## 2015-01-19 ENCOUNTER — Ambulatory Visit (INDEPENDENT_AMBULATORY_CARE_PROVIDER_SITE_OTHER): Payer: Medicare Other | Admitting: Family

## 2015-01-19 VITALS — BP 152/92 | HR 76 | Temp 97.5°F | Resp 18 | Ht 64.0 in | Wt 227.0 lb

## 2015-01-19 DIAGNOSIS — E038 Other specified hypothyroidism: Secondary | ICD-10-CM

## 2015-01-19 DIAGNOSIS — I10 Essential (primary) hypertension: Secondary | ICD-10-CM

## 2015-01-19 DIAGNOSIS — I82402 Acute embolism and thrombosis of unspecified deep veins of left lower extremity: Secondary | ICD-10-CM

## 2015-01-19 LAB — TSH: TSH: 1.81 u[IU]/mL (ref 0.35–4.50)

## 2015-01-19 NOTE — Telephone Encounter (Signed)
Please inform patient that her TSH is normal. Therefore please continue her current dosage of synthroid.

## 2015-01-19 NOTE — Patient Instructions (Signed)
Thank you for choosing [facility].  Summary/Instructions:  Please continue to take your medications as prescribed.   Please stop by the lab on the basement level of the building for your blood work. Your results will be released to [place] (or called to you) after review, usually within 72 hours after test completion. If any changes need to be made, you will be notified at that same time.  Referrals have been made during this visit. You should expect to hear back from our schedulers in about 7-10 days in regards to establishing an appointment with the specialists we discussed.   If your symptoms worsen or fail to improve, please contact our office for further instruction, or in case of emergency go directly to the emergency room at the closest medical facility.

## 2015-01-19 NOTE — Progress Notes (Signed)
Subjective:    Patient ID: Alicia Vasquez, female    DOB: Oct 21, 1940, 74 y.o.   MRN: [id number]  Chief Complaint  Patient presents with  . Establish Care    has issues with SOB at times, had 6 weeks of radiation for cancer, has had a blood clot in her left leg and it is still hurting really bad from clot    HPI:  Alicia Vasquez is a 74 y.o. female with a PMH of vitamin D deficiency, seasonal allergies, overactive bladder, hypothyroidism, hyperparathyroidism, hypertension, deep vein thrombosis, depression, and breast cancer who presents today for an office visit to establish care.   1.) Hypothyroidism - Currently maintained on levothyroxine. Takes the brand name Synthroid. Takes the medication as prescribed and denies side effects.   Lab Results  Component Value Date   TSH 1.81 01/19/2015    2.) DVT - Associated symptom of a DVT located in her left leg has been going on for about 4 years. Modifying factors include coumadin for anticoagulation and is currently managed at the coumadin clinic. Takes the medication as prescribed and denies adverse side effects or nusiance bleeding.    Lab Results  Component Value Date   INR 2.8 01/06/2015   INR 2.49* 11/10/2014   INR 2.09* 10/23/2014    3.) Hypertension - Currently maintained on lisinopril. Takes the medication as prescribed and denies a cough. Does describe some mild hoarseness. Takes her blood pressure at home and notes it to be 132/60.   BP Readings from Last 3 Encounters:  01/19/15 152/92  11/27/14 131/56  11/10/14 130/90     Allergies  Allergen Reactions  . Anesthetics, Amide Other (See Comments)    ELEVATE BLOOD PRESSURE  . Benadryl [Diphenhydramine Hcl]     dizziness  . Carbocaine [Mepivacaine Hcl] Other (See Comments)    Elevated blood pressure  . Codeine Nausea Only and Other (See Comments)    dizziness  . Epinephrine Other (See Comments)    Elevated blood pressure  . Sulfa Antibiotics Other (See Comments)   dizziness  . Vicodin [Hydrocodone-Acetaminophen] Nausea Only  . Penicillins Rash    Current Outpatient Prescriptions on File Prior to Visit  Medication Sig Dispense Refill  . acetaminophen (TYLENOL) 500 MG tablet Take 500 mg by mouth every 6 (six) hours as needed for mild pain, fever or headache.    . anastrozole (ARIMIDEX) 1 MG tablet Take 1 tablet (1 mg total) by mouth daily. 90 tablet 4  . Cholecalciferol (VITAMIN D3) 5000 UNITS TABS Take by mouth.    . levothyroxine (SYNTHROID) 125 MCG tablet Take 125 mcg by mouth daily before breakfast. Dispense as written: Synthroid    . lisinopril (PRINIVIL,ZESTRIL) 10 MG tablet TAKE 1 TABLET (10 MG TOTAL) BY MOUTH DAILY. 30 tablet 0  . montelukast (SINGULAIR) 10 MG tablet Take 1 tablet (10 mg total) by mouth at bedtime. 30 tablet 3  . non-metallic deodorant (ALRA) MISC Apply 1 application topically daily as needed.    Marland Kitchen oxyCODONE (OXY IR/ROXICODONE) 5 MG immediate release tablet Take 1 tablet (5 mg total) by mouth every 6 (six) hours as needed for severe pain. 30 tablet 0  . warfarin (COUMADIN) 5 MG tablet TAKE AS DIRECTED BY ANTICOAGULATION CLINIC 60 tablet 3   No current facility-administered medications on file prior to visit.    Past Medical History  Diagnosis Date  . Allergy   . Hypertension   . Hypothyroid   . Blood transfusion without reported diagnosis   .  Pneumonia   . DVT (deep vein thrombosis) in pregnancy   . Breast cancer 07/12/13    invasive mammary carcinoma  . Anxiety   . Arthritis   . Radiation 11/21/13-01/07/14    Right Breast/supraclav.     Review of Systems  Eyes:       Negative for changes in vision.  Respiratory: Positive for shortness of breath. Negative for chest tightness.   Cardiovascular: Negative for chest pain, palpitations and leg swelling.  Endocrine: Negative for cold intolerance and heat intolerance.  Neurological: Negative for headaches.      Objective:    BP 152/92 mmHg  Pulse 76  Temp(Src)  97.5 F (36.4 C) (Oral)  Resp 18  Ht 5\' 4"  (1.626 m)  Wt 227 lb (102.967 kg)  BMI 38.95 kg/m2  SpO2 96% Nursing note and vital signs reviewed.  Physical Exam  Constitutional: She is oriented to person, place, and time. She appears well-developed and well-nourished. No distress.  Neck: No thyromegaly present.  Cardiovascular: Normal rate, regular rhythm, normal heart sounds and intact distal pulses.   Pulmonary/Chest: Effort normal and breath sounds normal. She has no wheezes. She has no rales.  Neurological: She is alert and oriented to person, place, and time.  Skin: Skin is warm and dry.  Psychiatric: She has a normal mood and affect. Her behavior is normal. Judgment and thought content normal.       Assessment & Plan:   Problem List Items Addressed This Visit      Cardiovascular and Mediastinum   HTN (hypertension)    Hypertension remains labile with current regimen of lisinopril. Continue to monitor blood pressure at home. Continue current dosage of lisinopril. Follow-up in 2 weeks for nurse visit to establish blood pressure control.      DVT (deep venous thrombosis), left - Primary    Previously diagnosed DVT in left lower extremity and anticoagulated with Coumadin. Previously established and the Coumadin clinic and establishing care in the [facility] to continue Coumadin clinic treatment. Continue current treatment of Coumadin pending starting at Coumadin clinic. Obtain lower extremity Dopplers to check current status of blood clots.      Relevant Orders   VAS [name] LOWER EXTREMITY VENOUS (DVT)     Endocrine   Hypothyroid    Currently maintained on brand name Synthroid at 125 g daily. Denies adverse side effects. Obtain TSH. Continue current dosage of Synthroid pending TSH results.      Relevant Orders   TSH (Completed)

## 2015-01-19 NOTE — Assessment & Plan Note (Signed)
Previously diagnosed DVT in left lower extremity and anticoagulated with Coumadin. Previously established and the Coumadin clinic and establishing care in the [facility] to continue Coumadin clinic treatment. Continue current treatment of Coumadin pending starting at Coumadin clinic. Obtain lower extremity Dopplers to check current status of blood clots.

## 2015-01-19 NOTE — Assessment & Plan Note (Signed)
Currently maintained on brand name Synthroid at 125 g daily. Denies adverse side effects. Obtain TSH. Continue current dosage of Synthroid pending TSH results.

## 2015-01-19 NOTE — Progress Notes (Signed)
Pre visit review using our clinic review tool, if applicable. No additional management support is needed unless otherwise documented below in the visit note. 

## 2015-01-19 NOTE — Assessment & Plan Note (Signed)
Hypertension remains labile with current regimen of lisinopril. Continue to monitor blood pressure at home. Continue current dosage of lisinopril. Follow-up in 2 weeks for nurse visit to establish blood pressure control.

## 2015-01-20 ENCOUNTER — Ambulatory Visit (INDEPENDENT_AMBULATORY_CARE_PROVIDER_SITE_OTHER): Payer: Medicare Other | Admitting: Family Medicine

## 2015-01-20 VITALS — BP 144/80 | HR 80 | Temp 98.0°F | Resp 18 | Wt 226.0 lb

## 2015-01-20 DIAGNOSIS — R0789 Other chest pain: Secondary | ICD-10-CM | POA: Diagnosis not present

## 2015-01-20 MED ORDER — OXYCODONE HCL 5 MG PO TABS: 5.0000 mg | ORAL_TABLET | Freq: Four times a day (QID) | ORAL | Status: DC | PRN

## 2015-01-20 NOTE — Patient Instructions (Signed)
Let me go over things with you in 2 weeks.   Results for orders placed or performed in visit on 01/19/15  TSH  Result Value Ref Range   TSH 1.81 0.35 - 4.50 uIU/mL

## 2015-01-20 NOTE — Telephone Encounter (Signed)
LVM for pt to call back.

## 2015-01-20 NOTE — Progress Notes (Signed)
   Subjective:    Patient ID: Alicia Vasquez, female    DOB: 01/28/1941, 74 y.o.   MRN: [id number] This chart was scribed for [name], MD by Marti Sleigh, Medical Scribe. This patient was seen in Room 8 and the patient's care was started a 5:27 PM.  Chief Complaint  Patient presents with  . Follow-up    brest, chest, knee, and back pain    HPI HPI Comments: Alicia Vasquez is a 74 y.o. female who presents to [facility] reporting for a proton test. Pt had a TSH test done yesterday which was normal. Pt is also complaining of right-sided chest pain that radiates down her right arm for the last several weeks. She also states she has a mass under right her arm.   She also has had numbness in her right arm since her cancer treatment.   Review of Systems  Constitutional: Negative for fever and chills.  Respiratory:       Chest wall pain  Musculoskeletal: Positive for back pain and arthralgias.       Knee pain  Neurological: Positive for numbness.       Objective:   Physical Exam  Constitutional: She is oriented to person, place, and time. She appears well-developed and well-nourished. No distress.  HENT:  Head: Normocephalic and atraumatic.  Eyes: Pupils are equal, round, and reactive to light.  Neck: Neck supple.  Cardiovascular: Normal rate, regular rhythm and normal heart sounds.   No murmur heard. Pulmonary/Chest: Effort normal and breath sounds normal. No respiratory distress. She has no wheezes.  Musculoskeletal: Normal range of motion.  Irregular lateral edge of right pectoralis major with no definite adenopathy or mass  Neurological: She is alert and oriented to person, place, and time. Coordination normal.  Skin: Skin is warm and dry. She is not diaphoretic.  Psychiatric: She has a normal mood and affect. Her behavior is normal.  Nursing note and vitals reviewed.    Filed Vitals:   01/20/15 1702  BP: 144/80  Pulse: 80  Temp: 98 F (36.7 C)  Resp: 18  Weight:  226 lb (102.513 kg)  SpO2: 97%       Assessment & Plan:   This chart was scribed in my presence and reviewed by me personally.    ICD-9-CM ICD-10-CM   1. Chest wall pain 786.52 R07.89 oxyCODONE (OXY IR/ROXICODONE) 5 MG immediate release tablet   Patient will be getting her pro times done here for the foreseeable future. I've asked her to come back in 2 weeks.  I've encouraged her to be more active since the knee pain  Signed, [name], MD

## 2015-01-27 NOTE — Telephone Encounter (Signed)
LVM for pt to call back. Mailing results.

## 2015-01-28 ENCOUNTER — Other Ambulatory Visit: Payer: Self-pay | Admitting: Family Medicine

## 2015-01-30 NOTE — Telephone Encounter (Signed)
Dr Carlean Jews,   Do you want to fill this or is the coumadin clinic filling her meds

## 2015-01-30 NOTE — Telephone Encounter (Signed)
Patient is calling to follow up on refill request. She states she's almost out. Please call! [phone]

## 2015-01-31 NOTE — Telephone Encounter (Signed)
Patient called back in today and stated she is not going to the coumadin clinic  And stats she will be out of the meds on 02/01/15. She needs this called into the [facility].  She needs  Jantoven 5 MG  Her call back number is [phone]

## 2015-02-02 ENCOUNTER — Telehealth: Payer: Self-pay

## 2015-02-02 NOTE — Telephone Encounter (Signed)
JANTOVEN 5 MG tablet [[id number]] lisinopril (PRINIVIL,ZESTRIL) 10 MG tablet [[id number]] Patient called in requesting a refill on these medications. She will be out of these medicines tomorrow. Please call refills into [facility] pharmacy.

## 2015-02-03 ENCOUNTER — Ambulatory Visit: Payer: Medicare Other

## 2015-02-03 ENCOUNTER — Other Ambulatory Visit: Payer: Self-pay | Admitting: Family Medicine

## 2015-02-03 NOTE — Telephone Encounter (Signed)
Spoke with pt, advised Rx was sent in. She is coming to see Dr. Joseph Art on Thursday.

## 2015-02-05 ENCOUNTER — Ambulatory Visit (INDEPENDENT_AMBULATORY_CARE_PROVIDER_SITE_OTHER): Payer: Medicare Other | Admitting: Family Medicine

## 2015-02-05 ENCOUNTER — Ambulatory Visit (INDEPENDENT_AMBULATORY_CARE_PROVIDER_SITE_OTHER): Payer: Medicare Other

## 2015-02-05 VITALS — BP 142/78 | HR 88 | Temp 97.9°F | Resp 18 | Ht 64.0 in | Wt 224.2 lb

## 2015-02-05 DIAGNOSIS — R8299 Other abnormal findings in urine: Secondary | ICD-10-CM | POA: Diagnosis not present

## 2015-02-05 DIAGNOSIS — N63 Unspecified lump in unspecified breast: Secondary | ICD-10-CM

## 2015-02-05 DIAGNOSIS — R42 Dizziness and giddiness: Secondary | ICD-10-CM | POA: Diagnosis not present

## 2015-02-05 DIAGNOSIS — Z79899 Other long term (current) drug therapy: Secondary | ICD-10-CM

## 2015-02-05 DIAGNOSIS — I82409 Acute embolism and thrombosis of unspecified deep veins of unspecified lower extremity: Secondary | ICD-10-CM

## 2015-02-05 DIAGNOSIS — N390 Urinary tract infection, site not specified: Secondary | ICD-10-CM | POA: Diagnosis not present

## 2015-02-05 DIAGNOSIS — I10 Essential (primary) hypertension: Secondary | ICD-10-CM

## 2015-02-05 DIAGNOSIS — E038 Other specified hypothyroidism: Secondary | ICD-10-CM | POA: Diagnosis not present

## 2015-02-05 DIAGNOSIS — R0789 Other chest pain: Secondary | ICD-10-CM

## 2015-02-05 DIAGNOSIS — I82402 Acute embolism and thrombosis of unspecified deep veins of left lower extremity: Secondary | ICD-10-CM

## 2015-02-05 DIAGNOSIS — Z86711 Personal history of pulmonary embolism: Secondary | ICD-10-CM

## 2015-02-05 DIAGNOSIS — R8281 Pyuria: Secondary | ICD-10-CM

## 2015-02-05 LAB — POCT CBC
Granulocyte percent: 70 %G (ref 37–80)
HCT, POC: 44.5 % (ref 37.7–47.9)
Hemoglobin: 14.3 g/dL (ref 12.2–16.2)
Lymph, poc: 1.8 (ref 0.6–3.4)
MCH, POC: 29.8 pg (ref 27–31.2)
MCHC: 32.1 g/dL (ref 31.8–35.4)
MCV: 92.8 fL (ref 80–97)
MID (cbc): 0.4 (ref 0–0.9)
MPV: 7.6 fL (ref 0–99.8)
POC Granulocyte: 5.2 (ref 2–6.9)
POC LYMPH PERCENT: 24.7 %L (ref 10–50)
POC MID %: 5.3 %M (ref 0–12)
Platelet Count, POC: 236 10*3/uL (ref 142–424)
RBC: 4.8 M/uL (ref 4.04–5.48)
RDW, POC: 15 %
WBC: 7.4 10*3/uL (ref 4.6–10.2)

## 2015-02-05 LAB — POCT UA - MICROSCOPIC ONLY
Casts, Ur, LPF, POC: NEGATIVE
Crystals, Ur, HPF, POC: NEGATIVE
Mucus, UA: NEGATIVE
Yeast, UA: NEGATIVE

## 2015-02-05 LAB — POCT URINALYSIS DIPSTICK
Bilirubin, UA: NEGATIVE
Glucose, UA: NEGATIVE
Ketones, UA: NEGATIVE
Nitrite, UA: NEGATIVE
Protein, UA: NEGATIVE
Spec Grav, UA: 1.02
Urobilinogen, UA: 0.2
pH, UA: 5.5

## 2015-02-05 LAB — POCT SEDIMENTATION RATE: POCT SED RATE: 5 mm/hr (ref 0–22)

## 2015-02-05 MED ORDER — WARFARIN SODIUM 5 MG PO TABS: ORAL_TABLET | ORAL | Status: DC

## 2015-02-05 MED ORDER — LISINOPRIL 10 MG PO TABS: ORAL_TABLET | ORAL | Status: DC

## 2015-02-05 MED ORDER — LEVOTHYROXINE SODIUM 125 MCG PO TABS: 125.0000 ug | ORAL_TABLET | Freq: Every day | ORAL | Status: DC

## 2015-02-05 MED ORDER — LEVOFLOXACIN 500 MG PO TABS: 500.0000 mg | ORAL_TABLET | Freq: Every day | ORAL | Status: DC

## 2015-02-05 NOTE — Patient Instructions (Signed)
Please come back to see September 13. I'll be in after 2 PM.  Were treating you for urinary infection and left ear infection.

## 2015-02-05 NOTE — Progress Notes (Addendum)
This chart was scribed for [name], MD by Moises Blood, medical scribe at [facility].The patient was seen in exam room 2 and the patient's care was started at 5:54 PM.  Patient ID: Alicia Vasquez MRN: [id number], DOB: 03/04/1941, 74 y.o. Date of Encounter: 02/05/2015  Primary Physician: [name], MD  Chief Complaint:  Chief Complaint  Patient presents with  . Follow-up    PT-INR  . Sinus Congestion    X 2 days  . Otalgia    X yesterday, left    HPI:  Alicia Vasquez is a 74 y.o. female who presents to [facility] for follow up.  She's also had some otalgia (left ear) and fatigue starting yesterday. When she turns to the left, she feels dizzy. She also has some lower back pain, intermittent chest pain and left leg pain starting yesterday. In the room, she has some shortness of breath.   She's also had sinus congestion for past 2 days.   Past Medical History  Diagnosis Date  . Allergy   . Hypertension   . Hypothyroid   . Blood transfusion without reported diagnosis   . Pneumonia   . DVT (deep vein thrombosis) in pregnancy   . Breast cancer 07/12/13    invasive mammary carcinoma  . Anxiety   . Arthritis   . Radiation 11/21/13-01/07/14    Right Breast/supraclav.     Home Meds: Prior to Admission medications   Medication Sig Start Date End Date Taking? Authorizing Provider  acetaminophen (TYLENOL) 500 MG tablet Take 500 mg by mouth every 6 (six) hours as needed for mild pain, fever or headache.    Historical Provider, MD  anastrozole (ARIMIDEX) 1 MG tablet Take 1 tablet (1 mg total) by mouth daily. Patient not taking: Reported on 01/20/2015 11/27/14   Chauncey Cruel, MD  Cholecalciferol (VITAMIN D3) 5000 UNITS TABS Take by mouth.    Historical Provider, MD  JANTOVEN 5 MG tablet TAKE AS DIRECTED BY ANTICOAGULATION CLINIC 01/31/15   [name], MD  levothyroxine (SYNTHROID) 125 MCG tablet Take 125 mcg by mouth daily  before breakfast. Dispense as written: Synthroid    Historical Provider, MD  lisinopril (PRINIVIL,ZESTRIL) 10 MG tablet TAKE 1 TABLET BY MOUTH DAILY   "NEEDS OV " 02/03/15   Chelle Jeffery, PA-C  montelukast (SINGULAIR) 10 MG tablet Take 1 tablet (10 mg total) by mouth at bedtime. Patient not taking: Reported on 01/20/2015 08/07/14   [name], MD  non-metallic deodorant Jethro Poling) MISC Apply 1 application topically daily as needed. 11/27/13   Thea Silversmith, MD  oxyCODONE (OXY IR/ROXICODONE) 5 MG immediate release tablet Take 1 tablet (5 mg total) by mouth every 6 (six) hours as needed for severe pain. 01/20/15   [name], MD    Allergies:  Allergies  Allergen Reactions  . Anesthetics, Amide Other (See Comments)    ELEVATE BLOOD PRESSURE  . Benadryl [Diphenhydramine Hcl]     dizziness  . Carbocaine [Mepivacaine Hcl] Other (See Comments)    Elevated blood pressure  . Codeine Nausea Only and Other (See Comments)    dizziness  . Epinephrine Other (See Comments)    Elevated blood pressure  . Sulfa Antibiotics Other (See Comments)    dizziness  . Vicodin [Hydrocodone-Acetaminophen] Nausea Only  . Penicillins Rash    Social History   Social History  . Marital Status: Widowed    Spouse Name: N/A  . Number of Children: 1  .  Years of Education: N/A   Occupational History  . Not on file.   Social History Main Topics  . Smoking status: Never Smoker   . Smokeless tobacco: Never Used  . Alcohol Use: No  . Drug Use: No  . Sexual Activity: No   Other Topics Concern  . Not on file   Social History Narrative   Exercise yard work     Review of Systems: Constitutional: negative for chills, night sweats, weight changes; positive fatigue, fever HEENT: negative for vision changes, hearing loss, congestion, rhinorrhea, ST, epistaxis, or sinus pressure; positive for otalgia (left)  Cardiovascular: negative for palpitations; positive chest pain Respiratory: negative for hemoptysis,  wheezing, or cough; positive for shortness of breath Abdominal: negative for abdominal pain, nausea, vomiting, diarrhea, or constipation Dermatological: negative for rash Neurologic: negative for headache, or syncope; positive for dizziness All other systems reviewed and are otherwise negative with the exception to those above and in the HPI.  Physical Exam: Blood pressure 142/78, pulse 88, temperature 97.9 F (36.6 C), temperature source Oral, resp. rate 18, height 5\' 4"  (1.626 m), weight 224 lb 3.2 oz (101.696 kg), SpO2 96 %., Body mass index is 38.46 kg/(m^2). General: Well developed, well nourished, in no acute distress. Head: Normocephalic, atraumatic, eyes without discharge, sclera non-icteric, nares are without discharge. Bilateral auditory canals clear, TM's are without perforation, pearly grey and translucent with reflective cone of light bilaterally. Oral cavity moist, posterior pharynx without exudate, erythema, peritonsillar abscess, or post nasal drip. fluid with retraction of the ear drum   Neck: Supple. No thyromegaly. Full ROM. No lymphadenopathy. Lungs: Clear bilaterally to auscultation without wheezes, rales, or rhonchi. Breathing is unlabored. Heart: RRR with S1 S2. No murmurs, rubs, or gallops appreciated. Abdomen: Soft, non-tender, non-distended with normoactive bowel sounds. No hepatomegaly. No rebound/guarding. No obvious abdominal masses. Msk:  Strength and tone normal for age. Extremities/Skin: Warm and dry. No clubbing or cyanosis. No edema. No rashes or suspicious lesions. Neuro: Alert and oriented X 3. Moves all extremities spontaneously. Gait is normal. CNII-XII grossly in tact. Psych:  Responds to questions appropriately with a normal affect.   Labs: Results for orders placed or performed in visit on 02/05/15  POCT CBC  Result Value Ref Range   WBC 7.4 4.6 - 10.2 K/uL   Lymph, poc 1.8 0.6 - 3.4   POC LYMPH PERCENT 24.7 10 - 50 %L   MID (cbc) 0.4 0 - 0.9   POC  MID % 5.3 0 - 12 %M   POC Granulocyte 5.2 2 - 6.9   Granulocyte percent 70.0 37 - 80 %G   RBC 4.80 4.04 - 5.48 M/uL   Hemoglobin 14.3 12.2 - 16.2 g/dL   HCT, POC 44.5 37.7 - 47.9 %   MCV 92.8 80 - 97 fL   MCH, POC 29.8 27 - 31.2 pg   MCHC 32.1 31.8 - 35.4 g/dL   RDW, POC 15.0 %   Platelet Count, POC 236 142 - 424 K/uL   MPV 7.6 0 - 99.8 fL  POCT urinalysis dipstick  Result Value Ref Range   Color, UA yellow    Clarity, UA clear    Glucose, UA neg    Bilirubin, UA neg    Ketones, UA neg    Spec Grav, UA 1.020    Blood, UA trace    pH, UA 5.5    Protein, UA neg    Urobilinogen, UA 0.2    Nitrite, UA neg  Leukocytes, UA small (1+) (A) Negative  POCT UA - Microscopic Only  Result Value Ref Range   WBC, Ur, HPF, POC 4-8    RBC, urine, microscopic 1-3    Bacteria, U Microscopic trace    Mucus, UA neg    Epithelial cells, urine per micros 1-3    Crystals, Ur, HPF, POC neg    Casts, Ur, LPF, POC neg    Yeast, UA neg    [facility] reading (PRIMARY) by  Dr. Joseph Art: CXR without acute changes..  EKG:  NSR ASSESSMENT AND PLAN:  74 y.o. year old female with  This chart was scribed in my presence and reviewed by me personally.    ICD-9-CM ICD-10-CM   1. Chest wall pain 786.52 R07.89 EKG 12-Lead     DG Chest 2 View  2. DVT (deep venous thrombosis), unspecified laterality 453.40 I82.409 Protime-INR     warfarin (JANTOVEN) 5 MG tablet  3. History of pulmonary embolus (PE) V12.55 Z86.711 Protime-INR     warfarin (JANTOVEN) 5 MG tablet  4. DVT (deep venous thrombosis), left 453.40 I82.402 Protime-INR     warfarin (JANTOVEN) 5 MG tablet  5. Subcutaneous nodule of breast 611.89 N63   6. Encounter for medication review V58.69 Z79.899 Protime-INR  7. Dizziness and giddiness 780.4 R42 POCT CBC     POCT SEDIMENTATION RATE     POCT urinalysis dipstick     POCT UA - Microscopic Only     COMPLETE METABOLIC PANEL WITH GFR     levofloxacin (LEVAQUIN) 500 MG tablet  8. Other specified  hypothyroidism 244.8 E03.8 levothyroxine (SYNTHROID) 125 MCG tablet  9. Essential hypertension 401.9 I10 lisinopril (PRINIVIL,ZESTRIL) 10 MG tablet  10. Pyuria 791.9 N39.0 Urine culture     levofloxacin (LEVAQUIN) 500 MG tablet   I believe it because of the otitis in the left ear and the UTI, most of the symptoms should resolve with several days of antibiotic. I've asked the patient come back on Tuesday next.  Signed, [name], MD    Signed, [name], MD 02/05/2015 6:59 PM

## 2015-02-06 ENCOUNTER — Other Ambulatory Visit: Payer: Self-pay | Admitting: Family Medicine

## 2015-02-06 DIAGNOSIS — R9389 Abnormal findings on diagnostic imaging of other specified body structures: Secondary | ICD-10-CM

## 2015-02-06 LAB — COMPLETE METABOLIC PANEL WITH GFR
ALT: 19 U/L (ref 6–29)
AST: 16 U/L (ref 10–35)
Albumin: 4.3 g/dL (ref 3.6–5.1)
Alkaline Phosphatase: 73 U/L (ref 33–130)
BUN: 21 mg/dL (ref 7–25)
CO2: 30 mmol/L (ref 20–31)
Calcium: 10.4 mg/dL (ref 8.6–10.4)
Chloride: 105 mmol/L (ref 98–110)
Creat: 0.83 mg/dL (ref 0.60–0.93)
GFR, Est African American: 80 mL/min (ref >=60)
GFR, Est Non African American: 69 mL/min (ref >=60)
Glucose, Bld: 94 mg/dL (ref 65–99)
Potassium: 4.5 mmol/L (ref 3.5–5.3)
Sodium: 139 mmol/L (ref 135–146)
Total Bilirubin: 0.4 mg/dL (ref 0.2–1.2)
Total Protein: 6.9 g/dL (ref 6.1–8.1)

## 2015-02-06 LAB — PROTIME-INR
INR: 2.15 — ABNORMAL HIGH (ref <1.50)
Prothrombin Time: 24.3 seconds — ABNORMAL HIGH (ref 11.6–15.2)

## 2015-02-07 LAB — URINE CULTURE
Colony Count: NO GROWTH
Organism ID, Bacteria: NO GROWTH

## 2015-02-13 ENCOUNTER — Ambulatory Visit (INDEPENDENT_AMBULATORY_CARE_PROVIDER_SITE_OTHER): Payer: Medicare Other | Admitting: Family Medicine

## 2015-02-13 VITALS — BP 120/74 | HR 79 | Temp 97.8°F | Resp 16 | Ht 63.25 in | Wt 226.0 lb

## 2015-02-13 DIAGNOSIS — M79605 Pain in left leg: Secondary | ICD-10-CM

## 2015-02-13 DIAGNOSIS — N3000 Acute cystitis without hematuria: Secondary | ICD-10-CM

## 2015-02-13 DIAGNOSIS — H9202 Otalgia, left ear: Secondary | ICD-10-CM

## 2015-02-13 DIAGNOSIS — R0789 Other chest pain: Secondary | ICD-10-CM

## 2015-02-13 DIAGNOSIS — M542 Cervicalgia: Secondary | ICD-10-CM | POA: Diagnosis not present

## 2015-02-13 DIAGNOSIS — K0510 Chronic gingivitis, plaque induced: Secondary | ICD-10-CM

## 2015-02-13 LAB — POCT URINALYSIS DIPSTICK
Bilirubin, UA: NEGATIVE
Glucose, UA: NEGATIVE
Ketones, UA: NEGATIVE
Nitrite, UA: NEGATIVE
Protein, UA: NEGATIVE
Spec Grav, UA: 1.02
Urobilinogen, UA: 0.2
pH, UA: 7

## 2015-02-13 LAB — POCT UA - MICROSCOPIC ONLY
Bacteria, U Microscopic: NEGATIVE
Casts, Ur, LPF, POC: NEGATIVE
Crystals, Ur, HPF, POC: NEGATIVE
Mucus, UA: NEGATIVE
RBC, urine, microscopic: NEGATIVE
Yeast, UA: NEGATIVE

## 2015-02-13 MED ORDER — OXYCODONE HCL 5 MG PO TABS
5.0000 mg | ORAL_TABLET | Freq: Four times a day (QID) | ORAL | Status: DC | PRN
Start: 2015-02-13 — End: 2015-04-16

## 2015-02-13 MED ORDER — CLINDAMYCIN HCL 150 MG PO CAPS: 150.0000 mg | ORAL_CAPSULE | Freq: Three times a day (TID) | ORAL | Status: DC

## 2015-02-13 NOTE — Patient Instructions (Signed)
Alicia Vasquez, your urine looks like it's cleared up. He to have some infection where the left upper tooth was extracted. This can cause the swelling in your neck and can also cause some swelling near her ear and block up some of the normal drainage. We see that the left ear is scarred on the membrane and hopefully in the next 6-7 days this mild infection in the gum and year will clear up with the clindamycin. I would like to see you after your imaging studies are done.  As we discussed, element 8 next Monday's Coumadin (warfarin) dose while you're on the antibiotic.

## 2015-02-13 NOTE — Progress Notes (Signed)
Patient ID: Alicia Vasquez, female   DOB: January 01, 1941, 74 y.o.   MRN: [id number]  This chart was scribed for Alicia Haber, MD by Ladene Artist, ED Scribe. The patient was seen in room 13. Patient's care was started at 12:11 PM.  Patient ID: Alicia Vasquez MRN: [id number], DOB: 1940-12-11, 74 y.o. Date of Encounter: 02/13/2015, 12:11 PM  Primary Physician: Alicia Haber, MD   Chief Complaint  Patient presents with   Follow-up    left side of neck, "gland hurts" seen 02/05/15   Urinary Tract Infection    still having lower back pain, left lower abd pain   Ankle Pain    left     HPI: 74 y.o. year old female with history below presents for a follow-up regarding a tender gland on her left neck. Pt was seen on 02/05/15 for this. She reports persistent pain that radiates into her left ear and swelling to the area. Pain is exacerbated with lying on her left and turning her neck.   Left Ankle Pain She reports left knee pain that has moved into her left ankle for the past week. She describes pain as soreness that is exacerbated with a lot of walking. She denies swelling and left knee pain at this time.   Immunizations Pt has not had a flu vaccine yet but states that she will get one soon. She declines a flu vaccine at this visit.   Past Medical History  Diagnosis Date   Allergy    Hypertension    Hypothyroid    Blood transfusion without reported diagnosis    Pneumonia    DVT (deep vein thrombosis) in pregnancy    Breast cancer 07/12/13    invasive mammary carcinoma   Anxiety    Arthritis    Radiation 11/21/13-01/07/14    Right Breast/supraclav.     Home Meds: Prior to Admission medications   Medication Sig Start Date End Date Taking? Authorizing Provider  acetaminophen (TYLENOL) 500 MG tablet Take 500 mg by mouth every 6 (six) hours as needed for mild pain, fever or headache.   Yes Historical Provider, MD  Cholecalciferol (VITAMIN D3) 5000 UNITS TABS Take by mouth.   Yes  Historical Provider, MD  levofloxacin (LEVAQUIN) 500 MG tablet Take 1 tablet (500 mg total) by mouth daily. 02/05/15  Yes Alicia Haber, MD  levothyroxine (SYNTHROID) 125 MCG tablet Take 1 tablet (125 mcg total) by mouth daily before breakfast. Dispense as written: Synthroid 02/05/15  Yes Alicia Haber, MD  lisinopril (PRINIVIL,ZESTRIL) 10 MG tablet TAKE 1 TABLET BY MOUTH DAILY   "NEEDS OV " 02/05/15  Yes Alicia Haber, MD  non-metallic deodorant Jethro Poling) MISC Apply 1 application topically daily as needed. 11/27/13  Yes Thea Silversmith, MD  oxyCODONE (OXY IR/ROXICODONE) 5 MG immediate release tablet Take 1 tablet (5 mg total) by mouth every 6 (six) hours as needed for severe pain. 01/20/15  Yes Alicia Haber, MD  warfarin (JANTOVEN) 5 MG tablet TAKE AS DIRECTED BY ANTICOAGULATION CLINIC 02/05/15  Yes Alicia Haber, MD  anastrozole (ARIMIDEX) 1 MG tablet Take 1 tablet (1 mg total) by mouth daily. Patient not taking: Reported on 02/13/2015 11/27/14   Chauncey Cruel, MD  montelukast (SINGULAIR) 10 MG tablet Take 1 tablet (10 mg total) by mouth at bedtime. Patient not taking: Reported on 01/20/2015 08/07/14   Alicia Haber, MD    Allergies:  Allergies  Allergen Reactions   Anesthetics, Amide Other (See Comments)    ELEVATE BLOOD PRESSURE   Benadryl [  Diphenhydramine Hcl]     dizziness   Carbocaine [Mepivacaine Hcl] Other (See Comments)    Elevated blood pressure   Codeine Nausea Only and Other (See Comments)    dizziness   Epinephrine Other (See Comments)    Elevated blood pressure   Sulfa Antibiotics Other (See Comments)    dizziness   Vicodin [Hydrocodone-Acetaminophen] Nausea Only   Penicillins Rash    Social History   Social History   Marital Status: Widowed    Spouse Name: N/A   Number of Children: 1   Years of Education: N/A   Occupational History   Not on file.   Social History Main Topics   Smoking status: Never Smoker    Smokeless tobacco: Never Used    Alcohol Use: No   Drug Use: No   Sexual Activity: No   Other Topics Concern   Not on file   Social History Narrative   Exercise yard work     Review of Systems: Constitutional: negative for chills, fever, night sweats, weight changes, or fatigue  HEENT: negative for vision changes, hearing loss, congestion, rhinorrhea, ST, epistaxis, or sinus pressure, + facial swelling, + ear pain Cardiovascular: negative for chest pain or palpitations Respiratory: negative for hemoptysis, wheezing, shortness of breath, or cough Abdominal: negative for abdominal pain, nausea, vomiting, diarrhea, or constipation Msk: negative for joint swelling, + neck pain, + arthralgias  Dermatological: negative for rash Neurologic: negative for headache, dizziness, or syncope All other systems reviewed and are otherwise negative with the exception to those above and in the HPI.  Physical Exam: Blood pressure 120/74, pulse 79, temperature 97.8 F (36.6 C), temperature source Oral, resp. rate 16, height 5' 3.25" (1.607 m), weight 226 lb (102.513 kg), SpO2 97 %., Body mass index is 39.7 kg/(m^2). General: Well developed, well nourished, in no acute distress. Head: Normocephalic, atraumatic, eyes without discharge, sclera non-icteric, nares are without discharge. Bilateral auditory canals clear. L TM is dull and retracted. Tooth 22 has been extracted and there is a cellulitis in the cavity from which the tooth was extracted. Oral cavity moist, posterior pharynx without exudate, erythema, peritonsillar abscess, or post nasal drip.  Neck: Supple. No thyromegaly. Full ROM. Mildly swollen L submandibular node.  Lungs: Clear bilaterally to auscultation without wheezes, rales, or rhonchi. Breathing is unlabored. Heart: RRR with S1 S2. No murmurs, rubs, or gallops appreciated. Abdomen: Soft, non-tender, non-distended with normoactive bowel sounds. No hepatomegaly. No rebound/guarding. No obvious abdominal masses. Msk:   Strength and tone normal for age.  Extremities/Skin: Warm and dry. No clubbing or cyanosis. No rashes or suspicious lesions. Normal L ankle and lower leg with good pulses and no edema.  Neuro: Alert and oriented X 3. Moves all extremities spontaneously. Gait is normal. CNII-XII grossly in tact. Psych:  Responds to questions appropriately with a normal affect.   Labs: Results for orders placed or performed in visit on 02/13/15  POCT urinalysis dipstick  Result Value Ref Range   Color, UA yellow    Clarity, UA clear    Glucose, UA neg    Bilirubin, UA neg    Ketones, UA neg    Spec Grav, UA 1.020    Blood, UA nbeg    pH, UA 7.0    Protein, UA neg    Urobilinogen, UA 0.2    Nitrite, UA neg    Leukocytes, UA Trace (A) Negative  POCT UA - Microscopic Only  Result Value Ref Range   WBC, Ur, HPF, POC  0-4    RBC, urine, microscopic NEG    Bacteria, U Microscopic NEG    Mucus, UA NEG    Epithelial cells, urine per micros 1-4    Crystals, Ur, HPF, POC NEG    Casts, Ur, LPF, POC NEG    Yeast, UA NEG      ASSESSMENT AND PLAN:  74 y.o. year old female with  1. Acute cystitis without hematuria   2. Gingivitis   3. Otalgia of left ear   4. Neck pain on left side   5. Left leg pain   6. Chest wall pain    This chart was scribed in my presence and reviewed by me personally.    ICD-9-CM ICD-10-CM   1. Acute cystitis without hematuria 595.0 N30.00 POCT urinalysis dipstick     POCT UA - Microscopic Only  2. Gingivitis 523.10 K05.10 clindamycin (CLEOCIN) 150 MG capsule  3. Otalgia of left ear 388.70 H92.02   4. Neck pain on left side 723.1 M54.2   5. Left leg pain 729.5 M79.605   6. Chest wall pain 786.52 R07.89 oxyCODONE (OXY IR/ROXICODONE) 5 MG immediate release tablet    Signed, Alicia Haber, MD 02/13/2015 12:11 PM

## 2015-02-18 ENCOUNTER — Other Ambulatory Visit: Payer: Medicare Other

## 2015-02-19 ENCOUNTER — Other Ambulatory Visit (HOSPITAL_BASED_OUTPATIENT_CLINIC_OR_DEPARTMENT_OTHER): Payer: Medicare Other

## 2015-02-19 DIAGNOSIS — C50919 Malignant neoplasm of unspecified site of unspecified female breast: Secondary | ICD-10-CM

## 2015-02-19 DIAGNOSIS — C50411 Malignant neoplasm of upper-outer quadrant of right female breast: Secondary | ICD-10-CM

## 2015-02-19 DIAGNOSIS — I82402 Acute embolism and thrombosis of unspecified deep veins of left lower extremity: Secondary | ICD-10-CM

## 2015-02-19 LAB — COMPREHENSIVE METABOLIC PANEL (CC13)
ALBUMIN: 3.7 g/dL (ref 3.5–5.0)
ALK PHOS: 73 U/L (ref 40–150)
ALT: 21 U/L (ref 0–55)
AST: 17 U/L (ref 5–34)
Anion Gap: 6 mEq/L (ref 3–11)
BILIRUBIN TOTAL: 0.37 mg/dL (ref 0.20–1.20)
BUN: 14.9 mg/dL (ref 7.0–26.0)
CO2: 28 mEq/L (ref 22–29)
Calcium: 10.1 mg/dL (ref 8.4–10.4)
Chloride: 108 mEq/L (ref 98–109)
Creatinine: 0.8 mg/dL (ref 0.6–1.1)
EGFR: 71 mL/min/{1.73_m2} — ABNORMAL LOW (ref >90)
GLUCOSE: 105 mg/dL (ref 70–140)
Potassium: 4.4 mEq/L (ref 3.5–5.1)
SODIUM: 142 meq/L (ref 136–145)
Total Protein: 6.4 g/dL (ref 6.4–8.3)

## 2015-02-19 LAB — CBC WITH DIFFERENTIAL/PLATELET
BASO%: 1 % (ref 0.0–2.0)
Basophils Absolute: 0.1 10*3/uL (ref 0.0–0.1)
EOS ABS: 0.2 10*3/uL (ref 0.0–0.5)
EOS%: 2.3 % (ref 0.0–7.0)
HCT: 41.9 % (ref 34.8–46.6)
HEMOGLOBIN: 13.8 g/dL (ref 11.6–15.9)
LYMPH%: 18.7 % (ref 14.0–49.7)
MCH: 30.9 pg (ref 25.1–34.0)
MCHC: 32.9 g/dL (ref 31.5–36.0)
MCV: 94.2 fL (ref 79.5–101.0)
MONO#: 1.3 10*3/uL — AB (ref 0.1–0.9)
MONO%: 17.6 % — ABNORMAL HIGH (ref 0.0–14.0)
NEUT%: 60.4 % (ref 38.4–76.8)
NEUTROS ABS: 4.6 10*3/uL (ref 1.5–6.5)
PLATELETS: 220 10*3/uL (ref 145–400)
RBC: 4.45 10*6/uL (ref 3.70–5.45)
RDW: 14.2 % (ref 11.2–14.5)
WBC: 7.6 10*3/uL (ref 3.9–10.3)
lymph#: 1.4 10*3/uL (ref 0.9–3.3)

## 2015-02-19 LAB — PROTIME-INR
INR: 1.6 — ABNORMAL LOW (ref 2.00–3.50)
Protime: 19.2 Seconds — ABNORMAL HIGH (ref 10.6–13.4)

## 2015-02-20 ENCOUNTER — Telehealth: Payer: Self-pay

## 2015-02-20 ENCOUNTER — Ambulatory Visit
Admission: RE | Admit: 2015-02-20 | Discharge: 2015-02-20 | Disposition: A | Discharge status: Home / Self Care | Payer: Medicare Other | Source: Ambulatory Visit | Attending: Family Medicine | Admitting: Family Medicine

## 2015-02-20 DIAGNOSIS — C50411 Malignant neoplasm of upper-outer quadrant of right female breast: Secondary | ICD-10-CM

## 2015-02-20 DIAGNOSIS — R9389 Abnormal findings on diagnostic imaging of other specified body structures: Secondary | ICD-10-CM

## 2015-02-20 DIAGNOSIS — I82402 Acute embolism and thrombosis of unspecified deep veins of left lower extremity: Secondary | ICD-10-CM

## 2015-02-20 DIAGNOSIS — J984 Other disorders of lung: Secondary | ICD-10-CM | POA: Diagnosis not present

## 2015-02-20 MED ORDER — IOPAMIDOL (ISOVUE-300) INJECTION 61%
75.0000 mL | Freq: Once | INTRAVENOUS | Status: AC | PRN
Start: 2015-02-20 — End: 2015-02-20
  Administered 2015-02-20: 75 mL via INTRAVENOUS

## 2015-02-20 NOTE — Telephone Encounter (Signed)
Called patient today with INR results which is 1.6  Per Dr. Jana Hakim patient needs to increase her coumadin to 5 mg / 7.5 mg alternating every other day.  Patient states an understanding of dosing and will return in 10 days for another INR blood draw.

## 2015-02-26 ENCOUNTER — Telehealth: Payer: Self-pay | Admitting: Oncology

## 2015-02-26 ENCOUNTER — Ambulatory Visit (HOSPITAL_BASED_OUTPATIENT_CLINIC_OR_DEPARTMENT_OTHER): Payer: Medicare Other | Admitting: Nurse Practitioner

## 2015-02-26 VITALS — BP 121/53 | HR 76 | Temp 97.4°F | Resp 18 | Ht 63.25 in | Wt 227.5 lb

## 2015-02-26 DIAGNOSIS — C50411 Malignant neoplasm of upper-outer quadrant of right female breast: Secondary | ICD-10-CM

## 2015-02-26 NOTE — Progress Notes (Signed)
[place]  [phone] [phone]     ID: [place] OB: 1941-02-16  MR#: [id number]  ENI#:[id number]  PCP: [name], MD GYN:   SU: [name] OTHER MD: Thea Silversmith, Hart Robinsons, Philemon Kingdom  CHIEF COMPLAINT: Estrogen receptor positive breast cancer CURRENT TREATMENT: To start anastrozole  BREAST CANCER HISTORY: From doctor [name]'s intake node 07/24/2013:  "74 y.o. female. Who presented with SOB and had a CT chest performed that revealed a right breast mass. Mammogram/ultrsound on 2/13 showed a mass in the 11 o'clock position in the right breast measuring 1.5 cm. Also noted was a right axillary LN measuring 1.6 cm. MRI not performed. Biopsy of mass and lymph done. 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 Lymph node + for metastatic carcinoma."  [On 09/09/2013 the patient underwent right lumpectomy and sentinel lymph node sampling. This showed ([id number]) multifocal invasive ductal carcinoma, grade 1, the largest lesion measuring 1.8 cm, the second lesion 1.2 cm. One of 4 sentinel lymph nodes was positive, with extracapsular extension. Margins were positive. HER-2 was repeated and was again negative. Further surgery 09/16/2013 obtained clear margins.  Her subsequent history is as detailed below  INTERVAL HISTORY: Alicia Vasquez returns today for follow up of her breast cancer. She was supposed to have started anastrozole both in April 2016 and also after her visit in June 2016, and she is yet to do so. This time she claims she was being treated for a UTI and otalgia with accompanying dizziness, but these are only recent events. She also is concerned about possible drug interaction, but Dr. Jana Hakim has already reassured her at her last visit that anastrozole poses no imminent threats to her current med list.  REVIEW  OF SYSTEMS: Alicia Vasquez denies fevers, chills, nausea, vomiting, or changes in bowel habits. The antibiotics she was placed on cleared her UTI. She is eating drinking well. She is chronically fatigued. She is short of breath with exertion, but denies chest pain, cough, or palpitations. 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 She continues to have aching and pains to her right breast and right axilla. She also has chronic back pain. She is on oxycodone PRN for this. She denied headaches, but describes vertigo and weakness. A detailed review of systems is otherwise stable.  PAST MEDICAL HISTORY: Past Medical History  Diagnosis Date  . Allergy   . Hypertension   . Hypothyroid   . Blood transfusion without reported diagnosis   . Pneumonia   . DVT (deep vein thrombosis) in pregnancy   . Breast cancer 07/12/13    invasive mammary carcinoma  . Anxiety   . Arthritis   . Radiation 11/21/13-01/07/14    Right Breast/supraclav.    PAST SURGICAL HISTORY: Past Surgical History  Procedure Laterality Date  . Removal of teeth  04/19/2012    13 teeth removed  . Biospy      female organs  . Dilation and curettage of uterus    . Breast lumpectomy with radioactive seed localization Right 09/09/2013    Procedure: BREAST LUMPECTOMY WITH RADIOACTIVE SEED LOCALIZATION WITH AXILLARY NODE EXCISION;  Surgeon: [name], MD;  Location: [facility];  Service: General;  Laterality: Right;  . Re-excision of breast lumpectomy Right 09/24/2013    Procedure: RE-EXCISION OF RIGHT BREAST LUMPECTOMY;  Surgeon: [name], MD;  Location: [facility];  Service: General;  Laterality: Right;    FAMILY HISTORY Family History  Problem Relation Age of Onset  . Cancer Brother     prostate  . Heart disease Brother   . Colon cancer Brother    the patient's father died at the age of 48 after an automobile accident. The patient's mother died at the age of  13. She was a Marine scientist here in [name] in the old [facility]. She was infected with polio and was confined to a wheelchair for a good part of her life. She eventually died of pneumonia. The patient had one brother, who died with prostate cancer. She had no sisters. There is no history of breast or ovarian cancer in the family.  GYNECOLOGIC HISTORY:  Menarche age 55, first live birth age 12, the patient is GX P1. She went through the change of life at age 36. 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 She is widowed. Currently she is staying with her friend Alicia Vasquez, who is a retired Radio producer. The patient's son Alicia Vasquez lives in [place]. He works an Engineer, technical sales. The patient has no grandchildren. She is a Tourist information centre manager but currently attends a [employer] with her friend Alicia Vasquez    ADVANCED DIRECTIVES: Not in place   HEALTH MAINTENANCE: Social History  Substance Use Topics  . Smoking status: Never Smoker   . Smokeless tobacco: Never Used  . Alcohol Use: No     Colonoscopy: Never  PAP:  Bone density: 10/01/2009; lowest T score -0.8  Lipid panel:  Allergies  Allergen Reactions  . Anesthetics, Amide Other (See Comments)    ELEVATE BLOOD PRESSURE  . Benadryl [Diphenhydramine Hcl]     dizziness  . Carbocaine [Mepivacaine Hcl] Other (See Comments)    Elevated blood pressure  . Codeine Nausea Only and Other (See Comments)    dizziness  . Epinephrine Other (See Comments)    Elevated blood pressure  . Sulfa Antibiotics Other (See Comments)    dizziness  . Vicodin [Hydrocodone-Acetaminophen] Nausea Only  . Penicillins Rash    Current Outpatient Prescriptions  Medication Sig Dispense Refill  . acetaminophen (TYLENOL) 500 MG tablet Take 500 mg by mouth every 6 (six) hours as needed for mild pain, fever or headache.    . Cholecalciferol (VITAMIN D3) 5000 UNITS TABS Take by mouth.    . clindamycin  (CLEOCIN) 150 MG capsule Take 1 capsule (150 mg total) by mouth 3 (three) times daily. 21 capsule 0  . levothyroxine (SYNTHROID) 125 MCG tablet Take 1 tablet (125 mcg total) by mouth daily before breakfast. Dispense as written: Synthroid 90 tablet 3  . lisinopril (PRINIVIL,ZESTRIL) 10 MG tablet TAKE 1 TABLET BY MOUTH DAILY   "NEEDS OV " 30 tablet 0  . non-metallic deodorant (ALRA) MISC Apply 1 application topically daily as needed.    . warfarin (JANTOVEN) 5 MG tablet TAKE AS DIRECTED BY ANTICOAGULATION CLINIC (Patient taking differently: 5 mg. Pt takes 43m alternating with 5 mg every other day.) 60 tablet 3  . anastrozole (ARIMIDEX) 1 MG tablet Take 1 tablet (1 mg total) by mouth daily. (Patient not taking: Reported on 02/13/2015) 90 tablet 4  . montelukast (SINGULAIR) 10 MG tablet Take 1 tablet (10 mg total) by mouth at bedtime. (Patient not taking: Reported on 01/20/2015) 30 tablet 3  . oxyCODONE (OXY IR/ROXICODONE) 5 MG immediate release tablet Take 1 tablet (5 mg total) by mouth  every 6 (six) hours as needed for severe pain. (Patient not taking: Reported on 02/26/2015) 30 tablet 0   No current facility-administered medications for this visit.    OBJECTIVE: Older white woman in no acute distress Filed Vitals:   02/26/15 1505  BP: 121/53  Pulse: 76  Temp: 97.4 F (36.3 C)  Resp: 18     Body mass index is 39.96 kg/(m^2).    ECOG FS:1 - Symptomatic but completely ambulatory  Skin: warm, dry  HEENT: sclerae anicteric, conjunctivae pink, oropharynx clear. No thrush or mucositis.  Lymph Nodes: No cervical or supraclavicular lymphadenopathy  Lungs: clear to auscultation bilaterally, no rales, wheezes, or rhonci  Heart: regular rate and rhythm  Abdomen: round, soft, non tender, positive bowel sounds  Musculoskeletal: No focal spinal tenderness, no peripheral edema  Neuro: non focal, well oriented, positive affect  Breasts: right breast status post lumpectomy and radiation. No evidence of  recurrent disease. Tenderness with examination. Right axilla benign. Left breast unremarkable.  LAB RESULTS:  CMP     Component Value Date/Time   NA 142 02/19/2015 1544   NA 139 02/05/2015 1821   K 4.4 02/19/2015 1544   K 4.5 02/05/2015 1821   CL 105 02/05/2015 1821   CO2 28 02/19/2015 1544   CO2 30 02/05/2015 1821   GLUCOSE 105 02/19/2015 1544   GLUCOSE 94 02/05/2015 1821   BUN 14.9 02/19/2015 1544   BUN 21 02/05/2015 1821   CREATININE 0.8 02/19/2015 1544   CREATININE 0.83 02/05/2015 1821   CREATININE 0.77 09/05/2013 1330   CALCIUM 10.1 02/19/2015 1544   CALCIUM 10.4 02/05/2015 1821   PROT 6.4 02/19/2015 1544   PROT 6.9 02/05/2015 1821   ALBUMIN 3.7 02/19/2015 1544   ALBUMIN 4.3 02/05/2015 1821   AST 17 02/19/2015 1544   AST 16 02/05/2015 1821   ALT 21 02/19/2015 1544   ALT 19 02/05/2015 1821   ALKPHOS 73 02/19/2015 1544   ALKPHOS 73 02/05/2015 1821   BILITOT 0.37 02/19/2015 1544   BILITOT 0.4 02/05/2015 1821   GFRNONAA 69 02/05/2015 1821   GFRNONAA 81* 09/05/2013 1330   GFRAA 80 02/05/2015 1821   GFRAA >90 09/05/2013 1330    I No results found for: SPEP  Lab Results  Component Value Date   WBC 7.6 02/19/2015   NEUTROABS 4.6 02/19/2015   HGB 13.8 02/19/2015   HCT 41.9 02/19/2015   MCV 94.2 02/19/2015   PLT 220 02/19/2015      Chemistry      Component Value Date/Time   NA 142 02/19/2015 1544   NA 139 02/05/2015 1821   K 4.4 02/19/2015 1544   K 4.5 02/05/2015 1821   CL 105 02/05/2015 1821   CO2 28 02/19/2015 1544   CO2 30 02/05/2015 1821   BUN 14.9 02/19/2015 1544   BUN 21 02/05/2015 1821   CREATININE 0.8 02/19/2015 1544   CREATININE 0.83 02/05/2015 1821   CREATININE 0.77 09/05/2013 1330      Component Value Date/Time   CALCIUM 10.1 02/19/2015 1544   CALCIUM 10.4 02/05/2015 1821   ALKPHOS 73 02/19/2015 1544   ALKPHOS 73 02/05/2015 1821   AST 17 02/19/2015 1544   AST 16 02/05/2015 1821   ALT 21 02/19/2015 1544   ALT 19 02/05/2015 1821    BILITOT 0.37 02/19/2015 1544   BILITOT 0.4 02/05/2015 1821       No results found for: LABCA2  No components found for: LABCA125  No results for input(s): INR in the  last 168 hours.  Urinalysis    Component Value Date/Time   BILIRUBINUR neg 02/13/2015 1211   PROTEINUR neg 02/13/2015 1211   UROBILINOGEN 0.2 02/13/2015 1211   NITRITE neg 02/13/2015 1211   LEUKOCYTESUR Trace* 02/13/2015 1211    STUDIES: Dg Chest 2 View  02/05/2015   CLINICAL DATA:  Fatigue and dizziness.  Intermittent chest pain  EXAM: CHEST  2 VIEW  COMPARISON:  April 14, 2014 chest radiograph and chest CT June 27, 2013  FINDINGS: There is a new opacity in the right apex measuring 2.2 x 1.7 cm. Lungs elsewhere clear. Heart size and pulmonary vascularity are normal. No adenopathy. There is mild degenerative change in thoracic spine. There are surgical clips in the right breast region.  IMPRESSION: Focal opacity right apex measuring 2.2 x 1.7 cm, not present previously. This finding is concerning for potential neoplasm in this area. Contrast enhanced chest CT to further assess advised. Lungs elsewhere clear. No adenopathy appreciable.  These results will be called to the ordering clinician or representative by the Radiologist Assistant, and communication documented in the PACS or zVision Dashboard.   Electronically Signed   By: Lowella Grip III M.D.   On: 02/05/2015 18:50   Ct Chest W Contrast  02/20/2015   CLINICAL DATA:  Chest pain, shortness of breath, occasional cough. Fatigue. Abnormal chest x-ray.  EXAM: CT CHEST WITH CONTRAST  TECHNIQUE: Multidetector CT imaging of the chest was performed during intravenous contrast administration.  CONTRAST:  86m ISOVUE-300 IOPAMIDOL (ISOVUE-300) INJECTION 61%  COMPARISON:  Chest x-ray 02/05/2015.  Chest CT 06/27/2013.  FINDINGS: The density in the right apex seen on chest x-ray corresponds to predominantly interstitial thickening and ground-glass opacities in the right  apex, most compatible with scarring. No well-defined measurable nodule/mass. Scarring in the lingula. Calcified right base granuloma. Lungs are otherwise clear. No effusions.  Heart is normal size. Aorta is normal caliber. No mediastinal, hilar, or axillary adenopathy. Chest wall soft tissues are unremarkable. Imaging into the upper abdomen shows no acute findings.  No acute bony abnormality or focal bone lesion. Calcified granuloma in the spleen.  IMPRESSION: Density in the right apex appears to represent scarring. This could be followed with repeat chest CT in 6-12 months to ensure stability.  Old granulomatous disease.   Electronically Signed   By: KRolm BaptiseM.D.   On: 02/20/2015 17:19    ASSESSMENT: 74y.o. [place] woman status post right lumpectomy and sentinel lymph node sampling 09/09/2013 for an mpT1c pN1a, stage IIA invasive ductal carcinoma, estrogen and progesterone receptor both 100% positive with strong staining intensity, MIB-1 of 17% and no HER-2 amplification  (1) additional surgery for margin clearance 09/16/2013 obtained negative margins  (2) Oncotype DX recurrence score of 4 predicts a risk of outside the breast recurrence within 10 years of 7% if the patient's only systemic therapy is tamoxifen for 5 years. It also predicts no benefit from chemotherapy  (3) additional right breast surgery for margin clearance was successful, 09/16/2048  (4) adjuvant radiation completed 01/07/2014  (5) anastrozole started 02/27/2014 stopped within 2 weeks because of arm swelling. To restart on 02/28/15;   (a) bone density April 2016 showed osteopenia, with a t-score of -1.6  PLAN: Darnisha and I spent 25 minutes discussing the benefits to antiestrogen therapy. She understands that this would reduce her risk of recurrence by approximately 50%. It has been a full year since she was originally supposed to start on this drug. At this point is imperative for  her to take this seriously. She was  convinced at the end of our discussion to start anastrozole, but is hesitant. She did cites various unrelated chronic issues, and I reassured her that these would not be an issue with the anastrzole. She understands potential side effects include hot flashes, vaginal dryness, arthralgias/myalgias, and bone density loss.   Treena already has a follow up visit scheduled in 3 months determine her tolerance of this drug. She understands and agrees with this plan. She knows the goal of treatment in her case is cure. She has been encouraged to call with any issues that might arise before her next visit here.   Alicia Panda, NP   02/26/2015 4:03 PM

## 2015-02-26 NOTE — Telephone Encounter (Signed)
Per HB no pof sent patient just needs current appointments printed out. Patient given avs report and appointments.

## 2015-02-27 ENCOUNTER — Encounter: Payer: Self-pay | Admitting: Family Medicine

## 2015-02-27 ENCOUNTER — Ambulatory Visit (INDEPENDENT_AMBULATORY_CARE_PROVIDER_SITE_OTHER): Payer: Medicare Other | Admitting: Family Medicine

## 2015-02-27 ENCOUNTER — Encounter: Payer: Self-pay | Admitting: Nurse Practitioner

## 2015-02-27 VITALS — BP 118/82 | HR 83 | Temp 98.0°F | Resp 18 | Wt 226.4 lb

## 2015-02-27 DIAGNOSIS — H6522 Chronic serous otitis media, left ear: Secondary | ICD-10-CM

## 2015-02-27 DIAGNOSIS — R42 Dizziness and giddiness: Secondary | ICD-10-CM

## 2015-02-27 DIAGNOSIS — R938 Abnormal findings on diagnostic imaging of other specified body structures: Secondary | ICD-10-CM | POA: Diagnosis not present

## 2015-02-27 DIAGNOSIS — N3000 Acute cystitis without hematuria: Secondary | ICD-10-CM | POA: Diagnosis not present

## 2015-02-27 DIAGNOSIS — R5382 Chronic fatigue, unspecified: Secondary | ICD-10-CM

## 2015-02-27 DIAGNOSIS — R9389 Abnormal findings on diagnostic imaging of other specified body structures: Secondary | ICD-10-CM

## 2015-02-27 LAB — POCT URINALYSIS DIP (MANUAL ENTRY)
Bilirubin, UA: NEGATIVE
Blood, UA: NEGATIVE
Glucose, UA: NEGATIVE
Ketones, POC UA: NEGATIVE
Leukocytes, UA: NEGATIVE
Nitrite, UA: NEGATIVE
Protein Ur, POC: NEGATIVE
Spec Grav, UA: 1.015
Urobilinogen, UA: 0.2
pH, UA: 7

## 2015-02-27 LAB — POC MICROSCOPIC URINALYSIS (UMFC)

## 2015-02-27 NOTE — Progress Notes (Signed)
This chart was scribed for [name], MD by Moises Blood, medical scribe at [facility].The patient was seen in exam room 1 and the patient's care was started at 5:10 PM.  Patient ID: Alicia Vasquez MRN: [id number], DOB: 21-Jul-1940, 74 y.o. Date of Encounter: 02/27/2015  Primary Physician: [name], MD  Chief Complaint:  Chief Complaint  Patient presents with  . Follow-up    for inner ear problems with left ear and URI. Also had a CT scan and wants results from it.    HPI:  Alicia Vasquez is a 74 y.o. female who presents to [facility] for UTI follow up.  The urine still has some blood in the sample she gave today. She still has some vertigo when she turns to the left, some left ear pain and hoarse throat on left side. The back of her neck feels sore as well. She hasn't done physical therapy. She takes tylenol for the pain and soreness.   She still has some numbness in her right arm. She was at the cancer center yesterday. She has another appointment for INR at the cancer center on Monday.   Past Medical History  Diagnosis Date  . Allergy   . Hypertension   . Hypothyroid   . Blood transfusion without reported diagnosis   . Pneumonia   . DVT (deep vein thrombosis) in pregnancy   . Breast cancer 07/12/13    invasive mammary carcinoma  . Anxiety   . Arthritis   . Radiation 11/21/13-01/07/14    Right Breast/supraclav.     Home Meds: Prior to Admission medications   Medication Sig Start Date End Date Taking? Authorizing Provider  acetaminophen (TYLENOL) 500 MG tablet Take 500 mg by mouth every 6 (six) hours as needed for mild pain, fever or headache.    Historical Provider, MD  anastrozole (ARIMIDEX) 1 MG tablet Take 1 tablet (1 mg total) by mouth daily. Patient not taking: Reported on 02/13/2015 11/27/14   Chauncey Cruel, MD  Cholecalciferol (VITAMIN D3) 5000 UNITS TABS Take by mouth.    Historical Provider, MD  clindamycin  (CLEOCIN) 150 MG capsule Take 1 capsule (150 mg total) by mouth 3 (three) times daily. 02/13/15   [name], MD  levothyroxine (SYNTHROID) 125 MCG tablet Take 1 tablet (125 mcg total) by mouth daily before breakfast. Dispense as written: Synthroid 02/05/15   [name], MD  lisinopril (PRINIVIL,ZESTRIL) 10 MG tablet TAKE 1 TABLET BY MOUTH DAILY   "NEEDS OV " 02/05/15   [name], MD  montelukast (SINGULAIR) 10 MG tablet Take 1 tablet (10 mg total) by mouth at bedtime. Patient not taking: Reported on 01/20/2015 08/07/14   [name], MD  non-metallic deodorant Jethro Poling) MISC Apply 1 application topically daily as needed. 11/27/13   Thea Silversmith, MD  oxyCODONE (OXY IR/ROXICODONE) 5 MG immediate release tablet Take 1 tablet (5 mg total) by mouth every 6 (six) hours as needed for severe pain. Patient not taking: Reported on 02/26/2015 02/13/15   [name], MD  warfarin (JANTOVEN) 5 MG tablet TAKE AS DIRECTED BY ANTICOAGULATION CLINIC Patient taking differently: 5 mg. Pt takes 1mg  alternating with 5 mg every other day. 02/05/15   [name], MD    Allergies:  Allergies  Allergen Reactions  . Anesthetics, Amide Other (See Comments)    ELEVATE BLOOD PRESSURE  . Benadryl [Diphenhydramine Hcl]     dizziness  . Carbocaine [Mepivacaine Hcl] Other (See Comments)  Elevated blood pressure  . Codeine Nausea Only and Other (See Comments)    dizziness  . Epinephrine Other (See Comments)    Elevated blood pressure  . Sulfa Antibiotics Other (See Comments)    dizziness  . Vicodin [Hydrocodone-Acetaminophen] Nausea Only  . Penicillins Rash    Social History   Social History  . Marital Status: Widowed    Spouse Name: N/A  . Number of Children: 1  . Years of Education: N/A   Occupational History  . Not on file.   Social History Main Topics  . Smoking status: Never Smoker   . Smokeless tobacco: Never Used  . Alcohol Use: No  . Drug Use: No  . Sexual Activity: No    Other Topics Concern  . Not on file   Social History Narrative   Exercise yard work     Review of Systems: Constitutional: negative for chills, fever, night sweats, weight changes, or fatigue  HEENT: negative for vision changes, hearing loss, congestion, rhinorrhea, ST, epistaxis, or sinus pressure; positive for ear pain (left)  Cardiovascular: negative for chest pain or palpitations Respiratory: negative for hemoptysis, wheezing, shortness of breath, or cough Abdominal: negative for abdominal pain, vomiting, diarrhea, or constipation; positive for nausea Dermatological: negative for rash Neurologic: negative for headache, dizziness, or syncope Musc: positive for stiff neck All other systems reviewed and are otherwise negative with the exception to those above and in the HPI.  Physical Exam: Blood pressure 118/82, pulse 83, temperature 98 F (36.7 C), temperature source Oral, resp. rate 18, weight 226 lb 6.4 oz (102.694 kg), SpO2 94 %., Body mass index is 39.77 kg/(m^2). General: Well developed, well nourished, in no acute distress. Head: Normocephalic, atraumatic, eyes without discharge, sclera non-icteric, nares are without discharge. Oral cavity moist, posterior pharynx without exudate, erythema, peritonsillar abscess, or post nasal drip. Left ear has some fluid with opaque left TM Neck: Supple. No thyromegaly. Full ROM. No lymphadenopathy. Lungs: Clear bilaterally to auscultation without wheezes, rales, or rhonchi. Breathing is unlabored. Heart: RRR with S1 S2. No murmurs, rubs, or gallops appreciated. Abdomen: Soft, non-tender, non-distended with normoactive bowel sounds. No hepatomegaly. No rebound/guarding. No obvious abdominal masses. Msk:  Strength and tone normal for age. Extremities/Skin: Warm and dry. No clubbing or cyanosis. No edema. No rashes or suspicious lesions. Neuro: Alert and oriented X 3. Moves all extremities spontaneously. Gait is normal. CNII-XII grossly in  tact. Psych:  Responds to questions appropriately with a normal affect.     ASSESSMENT AND PLAN:  74 y.o. year old female with chronic anxiety about her risk cancer, persistent hematuria, serous otitis media, and balance issues.  This chart was scribed in my presence and reviewed by me personally.    ICD-9-CM ICD-10-CM   1. Acute cystitis without hematuria 595.0 N30.00 POCT urinalysis dipstick     POCT Microscopic Urinalysis ([facility])  2. Left chronic serous otitis media 381.10 H65.22 Ambulatory referral to ENT  3. Vertigo 780.4 R42 Ambulatory referral to ENT  4. Chronic fatigue 780.79 R53.82   5. Abnormal CXR 793.2 R93.8    Patient is to see oncology on Monday. Have asked her to have them call me once the visit is over so we can coordinate our efforts on getting this [place] fully anticoagulated and treated for her breast cancer.  Signed, [name], MD    By signing my name below, I, Moises Blood, attest that this documentation has been prepared under the direction and in the presence of [name],  MD. Electronically Signed: Moises Blood, Scribe. 02/27/2015 , 5:10 PM .  Signed, [name], MD 02/27/2015 5:10 PM

## 2015-02-27 NOTE — Patient Instructions (Signed)
You have some fluid behind the left ear drum which can be causing hearing loss and vertigo.  I am referring you to the ear, nose and throat doctor for this (next week)  Your urine shows no remaining infection

## 2015-03-02 ENCOUNTER — Telehealth: Payer: Self-pay

## 2015-03-02 ENCOUNTER — Other Ambulatory Visit (HOSPITAL_BASED_OUTPATIENT_CLINIC_OR_DEPARTMENT_OTHER): Payer: Medicare Other

## 2015-03-02 DIAGNOSIS — I82402 Acute embolism and thrombosis of unspecified deep veins of left lower extremity: Secondary | ICD-10-CM

## 2015-03-02 DIAGNOSIS — C50919 Malignant neoplasm of unspecified site of unspecified female breast: Secondary | ICD-10-CM

## 2015-03-02 DIAGNOSIS — C50411 Malignant neoplasm of upper-outer quadrant of right female breast: Secondary | ICD-10-CM

## 2015-03-02 LAB — PROTIME-INR
INR: 2 (ref 2.00–3.50)
PROTIME: 24 s — AB (ref 10.6–13.4)

## 2015-03-02 NOTE — Telephone Encounter (Signed)
Writer called patient today regarding her INR which was 2.0  Patient is taking 5 mg and alternating every other day with 7.5 mg   Patient is wondering if Dr. Jana Hakim can manage her coumadin level because she use to have it managed by [facility] and they no longer check INR's because her PCP in not part of the [facility] group.

## 2015-03-03 ENCOUNTER — Telehealth: Payer: Self-pay | Admitting: Oncology

## 2015-03-03 NOTE — Telephone Encounter (Signed)
lvm for pt regarding to OCT appt....mailed pt appt sched and letter °

## 2015-03-10 ENCOUNTER — Telehealth: Payer: Self-pay | Admitting: *Deleted

## 2015-03-10 NOTE — Telephone Encounter (Signed)
Called to f/u to see if pt was tolerating her Anastrozole. No answer, but left a message for pt to call this nurse back @ [phone]. Message to be fwd to [employer].

## 2015-03-11 ENCOUNTER — Other Ambulatory Visit: Payer: Self-pay | Admitting: Oncology

## 2015-03-11 ENCOUNTER — Other Ambulatory Visit: Payer: Self-pay | Admitting: Physician Assistant

## 2015-03-11 ENCOUNTER — Telehealth: Payer: Self-pay | Admitting: *Deleted

## 2015-03-11 NOTE — Telephone Encounter (Signed)
Second attempt. Call pt to see if she has started her anastrozole yet. No answer but left a detailed message for pt to call this nurse back @ [phone]. Message to be fwd to Gentry Fitz, NP.

## 2015-03-12 ENCOUNTER — Telehealth: Payer: Self-pay | Admitting: *Deleted

## 2015-03-12 NOTE — Telephone Encounter (Addendum)
Third attempt. Called pt no answer. Called pharmacist and she said  Anastrozole has been ordered and ready for pt to pick it up. They said their automated system will call the pt as well to come pick up her medication.

## 2015-03-12 NOTE — Telephone Encounter (Signed)
Chart reviewed.

## 2015-03-13 ENCOUNTER — Telehealth: Payer: Self-pay | Admitting: *Deleted

## 2015-03-13 NOTE — Telephone Encounter (Signed)
Spoke to pt this am about going to [facility] to pickup her anastrozole, so she can start medication today.. Pt had some anastrozole but it had expired therefore she had not started it since her last visit here. I emphasized the importance of going ahead and starting this med. Pt verbalized understanding and agreed with plan. Message to be fwd to Gentry Fitz, NP.

## 2015-03-16 DIAGNOSIS — H9192 Unspecified hearing loss, left ear: Secondary | ICD-10-CM | POA: Diagnosis not present

## 2015-03-16 DIAGNOSIS — H903 Sensorineural hearing loss, bilateral: Secondary | ICD-10-CM | POA: Diagnosis not present

## 2015-03-16 DIAGNOSIS — H8112 Benign paroxysmal vertigo, left ear: Secondary | ICD-10-CM | POA: Diagnosis not present

## 2015-03-30 ENCOUNTER — Other Ambulatory Visit: Payer: Self-pay | Admitting: *Deleted

## 2015-03-30 ENCOUNTER — Other Ambulatory Visit (HOSPITAL_BASED_OUTPATIENT_CLINIC_OR_DEPARTMENT_OTHER): Payer: Medicare Other

## 2015-03-30 DIAGNOSIS — C50411 Malignant neoplasm of upper-outer quadrant of right female breast: Secondary | ICD-10-CM

## 2015-03-30 DIAGNOSIS — I82402 Acute embolism and thrombosis of unspecified deep veins of left lower extremity: Secondary | ICD-10-CM | POA: Diagnosis present

## 2015-03-30 DIAGNOSIS — C50919 Malignant neoplasm of unspecified site of unspecified female breast: Secondary | ICD-10-CM

## 2015-03-30 LAB — PROTIME-INR
INR: 3.9 — ABNORMAL HIGH (ref 2.00–3.50)
Protime: 46.8 Seconds — ABNORMAL HIGH (ref 10.6–13.4)

## 2015-03-31 ENCOUNTER — Telehealth: Payer: Self-pay | Admitting: *Deleted

## 2015-03-31 NOTE — Telephone Encounter (Signed)
This RN attempted to contact pt per INR of 3.9.  Coumadin dose noted as 5mg  alt  7.5mg .  No further lab appointment.  Per MD review- recommended for pt to hold coumadin 1 day- then restart at 5mg  daily and recheck in 1 week post restart.  Obtained VM- message left to hold coumadin today and return call to this RN for further dosing instructions.  Will send POF to scheduling post pt communication.

## 2015-04-01 ENCOUNTER — Other Ambulatory Visit: Payer: Self-pay | Admitting: Family Medicine

## 2015-04-01 ENCOUNTER — Telehealth: Payer: Self-pay | Admitting: *Deleted

## 2015-04-01 NOTE — Telephone Encounter (Signed)
This RN spoke with pt per her return call regarding coumadin.  INR  3.9 Coumadin 5mg  alternating with 7.5mg  No further lab appointment.  This RN reviewed with pt md recommendation to  Hold coumadin 1 day then restart at 5mg  a day. Lab recheck in 1 week.  Pt states she held dose yesterday per VM message. She understands to take 5mg  every day.  Appointment made by this RN for lab on 04/08/2015 with pt at time of call.

## 2015-04-03 ENCOUNTER — Telehealth: Payer: Self-pay | Admitting: *Deleted

## 2015-04-03 DIAGNOSIS — M25561 Pain in right knee: Secondary | ICD-10-CM | POA: Insufficient documentation

## 2015-04-03 DIAGNOSIS — M25562 Pain in left knee: Secondary | ICD-10-CM

## 2015-04-03 NOTE — Telephone Encounter (Signed)
Called pt to f/u to see how it's been going since she's been back on the anastrozole. No answer, but left a detailed message for pt to call this nurse back @ [phone].

## 2015-04-08 ENCOUNTER — Other Ambulatory Visit (HOSPITAL_BASED_OUTPATIENT_CLINIC_OR_DEPARTMENT_OTHER): Payer: Medicare Other

## 2015-04-08 ENCOUNTER — Telehealth: Payer: Self-pay | Admitting: *Deleted

## 2015-04-08 DIAGNOSIS — C50411 Malignant neoplasm of upper-outer quadrant of right female breast: Secondary | ICD-10-CM

## 2015-04-08 DIAGNOSIS — I82402 Acute embolism and thrombosis of unspecified deep veins of left lower extremity: Secondary | ICD-10-CM | POA: Diagnosis present

## 2015-04-08 LAB — PROTIME-INR
INR: 1.8 — ABNORMAL LOW (ref 2.00–3.50)
Protime: 21.6 Seconds — ABNORMAL HIGH (ref 10.6–13.4)

## 2015-04-08 NOTE — Telephone Encounter (Signed)
Called pt to f/u to see how she is tolerating the anastrozole. No answer but left a detailed message for pt to call this nurse back@ [phone].

## 2015-04-09 ENCOUNTER — Telehealth: Payer: Self-pay

## 2015-04-09 ENCOUNTER — Other Ambulatory Visit: Payer: Self-pay | Admitting: Oncology

## 2015-04-09 NOTE — Telephone Encounter (Signed)
INR: 1.8 Present Dose: 5 mg daily New Dose: 7.5 mg Thursday and Mondays  5 mg all other days Recheck INR in 2 weeks Patient aware and states understanding

## 2015-04-10 ENCOUNTER — Telehealth: Payer: Self-pay | Admitting: *Deleted

## 2015-04-10 NOTE — Telephone Encounter (Signed)
Per POF 11/10 I have scheduled appts. I have called and left patient a message and to call back if she has any questions at [phone].  Mailed calendar

## 2015-04-16 ENCOUNTER — Ambulatory Visit (INDEPENDENT_AMBULATORY_CARE_PROVIDER_SITE_OTHER): Payer: Medicare Other | Admitting: Family Medicine

## 2015-04-16 ENCOUNTER — Encounter: Payer: Self-pay | Admitting: Family Medicine

## 2015-04-16 ENCOUNTER — Ambulatory Visit (INDEPENDENT_AMBULATORY_CARE_PROVIDER_SITE_OTHER): Payer: Medicare Other

## 2015-04-16 VITALS — BP 126/74 | HR 80 | Temp 97.6°F | Resp 16 | Ht 63.0 in | Wt 225.8 lb

## 2015-04-16 DIAGNOSIS — R0789 Other chest pain: Secondary | ICD-10-CM | POA: Diagnosis not present

## 2015-04-16 DIAGNOSIS — R0600 Dyspnea, unspecified: Secondary | ICD-10-CM

## 2015-04-16 DIAGNOSIS — C50919 Malignant neoplasm of unspecified site of unspecified female breast: Secondary | ICD-10-CM | POA: Diagnosis not present

## 2015-04-16 DIAGNOSIS — I82629 Acute embolism and thrombosis of deep veins of unspecified upper extremity: Secondary | ICD-10-CM | POA: Diagnosis not present

## 2015-04-16 DIAGNOSIS — I82409 Acute embolism and thrombosis of unspecified deep veins of unspecified lower extremity: Secondary | ICD-10-CM | POA: Diagnosis not present

## 2015-04-16 DIAGNOSIS — N63 Unspecified lump in breast: Secondary | ICD-10-CM

## 2015-04-16 DIAGNOSIS — N6310 Unspecified lump in the right breast, unspecified quadrant: Secondary | ICD-10-CM

## 2015-04-16 DIAGNOSIS — Z86711 Personal history of pulmonary embolism: Secondary | ICD-10-CM | POA: Diagnosis not present

## 2015-04-16 DIAGNOSIS — R21 Rash and other nonspecific skin eruption: Secondary | ICD-10-CM | POA: Diagnosis not present

## 2015-04-16 DIAGNOSIS — C50911 Malignant neoplasm of unspecified site of right female breast: Secondary | ICD-10-CM

## 2015-04-16 DIAGNOSIS — M79605 Pain in left leg: Secondary | ICD-10-CM | POA: Diagnosis not present

## 2015-04-16 DIAGNOSIS — M79609 Pain in unspecified limb: Secondary | ICD-10-CM | POA: Diagnosis not present

## 2015-04-16 DIAGNOSIS — N6315 Unspecified lump in the right breast, overlapping quadrants: Secondary | ICD-10-CM

## 2015-04-16 MED ORDER — TRIAMCINOLONE ACETONIDE 0.1 % EX CREA: 1.0000 "application " | TOPICAL_CREAM | Freq: Two times a day (BID) | CUTANEOUS | Status: DC

## 2015-04-16 MED ORDER — OXYCODONE HCL 5 MG PO TABS: 5.0000 mg | ORAL_TABLET | Freq: Four times a day (QID) | ORAL | Status: DC | PRN

## 2015-04-16 MED ORDER — LISINOPRIL 10 MG PO TABS: ORAL_TABLET | ORAL | Status: DC

## 2015-04-16 NOTE — Progress Notes (Addendum)
This chart was scribed for [name], MD by Moises Blood, medical scribe at [facility].The patient was seen in exam room 11 and the patient's care was started at 4:01 PM.  Patient ID: Alicia Vasquez MRN: [id number], DOB: [birth year], 74 y.o. Date of Encounter: 04/16/2015  Primary Physician: [name], MD  Chief Complaint:  Chief Complaint  Patient presents with   Follow-up    breast cancer, being on coumadin   Medication Refill    lisinopril   small mass    on left side of her head, and on left elbow    HPI:  Alicia Vasquez is a 74 y.o. female who presents to [facility] complaining of follow up.  Medications She needs refill on her lisinopril and the oxycodone. The oxycodone lasts for a while and she doesn't take it often. She hasn't been taking her medication for her cancer.   Cancer She states that her right breast has some pain. Her last ultrasound of the right breast was done in April 2016. The pain is around where radiation was done when the cancer was present.   Back Pain She has some lower back pain too. She had done physical therapy but had to stop because medicare wouldn't pay for it.   Mass She has 2 small bumps on the left side of her head and left elbow.   Past Medical History  Diagnosis Date   Allergy    Hypertension    Hypothyroid    Blood transfusion without reported diagnosis    Pneumonia    DVT (deep vein thrombosis) in pregnancy    Breast cancer ([place]) 07/12/13    invasive mammary carcinoma   Anxiety    Arthritis    Radiation 11/21/13-01/07/14    Right Breast/supraclav.     Home Meds: Prior to Admission medications   Medication Sig Start Date End Date Taking? Authorizing Provider  acetaminophen (TYLENOL) 500 MG tablet Take 500 mg by mouth every 6 (six) hours as needed for mild pain, fever or headache.    Historical Provider, MD  anastrozole (ARIMIDEX) 1 MG tablet Take 1 tablet (1 mg  total) by mouth daily. Patient not taking: Reported on 02/13/2015 11/27/14   Chauncey Cruel, MD  Cholecalciferol (VITAMIN D3) 5000 UNITS TABS Take by mouth.    Historical Provider, MD  clindamycin (CLEOCIN) 150 MG capsule Take 1 capsule (150 mg total) by mouth 3 (three) times daily. Patient not taking: Reported on 02/27/2015 02/13/15   [name], MD  levothyroxine (SYNTHROID) 125 MCG tablet Take 1 tablet (125 mcg total) by mouth daily before breakfast. Dispense as written: Synthroid 02/05/15   [name], MD  lisinopril (PRINIVIL,ZESTRIL) 10 MG tablet TAKE 1 TABLET BY MOUTH DAILY  "OFFICE VISIT NEEDED FOR REFILLS" 04/01/15   [name], MD  montelukast (SINGULAIR) 10 MG tablet Take 1 tablet (10 mg total) by mouth at bedtime. Patient not taking: Reported on 01/20/2015 08/07/14   [name], MD  non-metallic deodorant Jethro Poling) MISC Apply 1 application topically daily as needed. 11/27/13   Thea Silversmith, MD  oxyCODONE (OXY IR/ROXICODONE) 5 MG immediate release tablet Take 1 tablet (5 mg total) by mouth every 6 (six) hours as needed for severe pain. Patient not taking: Reported on 02/26/2015 02/13/15   [name], MD  warfarin (JANTOVEN) 5 MG tablet TAKE AS DIRECTED BY ANTICOAGULATION CLINIC Patient taking differently: 5 mg. Pt takes 1mg  alternating with 5 mg every other day. 02/05/15  [name], MD    Allergies:  Allergies  Allergen Reactions   Anesthetics, Amide Other (See Comments)    ELEVATE BLOOD PRESSURE   Benadryl [Diphenhydramine Hcl]     dizziness   Carbocaine [Mepivacaine Hcl] Other (See Comments)    Elevated blood pressure   Codeine Nausea Only and Other (See Comments)    dizziness   Epinephrine Other (See Comments)    Elevated blood pressure   Sulfa Antibiotics Other (See Comments)    dizziness   Vicodin [Hydrocodone-Acetaminophen] Nausea Only   Penicillins Rash    Social History   Social History   Marital Status: Widowed    Spouse Name:  N/A   Number of Children: 1   Years of Education: N/A   Occupational History   Not on file.   Social History Main Topics   Smoking status: Never Smoker    Smokeless tobacco: Never Used   Alcohol Use: No   Drug Use: No   Sexual Activity: No   Other Topics Concern   Not on file   Social History Narrative   Exercise yard work     Review of Systems: Constitutional: negative for fever, chills, night sweats, weight changes, or fatigue  HEENT: negative for vision changes, hearing loss, congestion, rhinorrhea, ST, epistaxis, or sinus pressure Cardiovascular: negative for chest pain or palpitations; positive for breast pain (right upper breast)  Respiratory: negative for hemoptysis, wheezing, or cough; positive for shortness of breath Abdominal: negative for abdominal pain, nausea, vomiting, diarrhea, or constipation Dermatological: negative for rash Neurologic: negative for headache, dizziness, or syncope Musc: positive for back pain (lower), knee pain (bilaterally)  All other systems reviewed and are otherwise negative with the exception to those above and in the HPI.  Physical Exam: Blood pressure 126/74, pulse 80, temperature 97.6 F (36.4 C), temperature source Oral, resp. rate 16, height 5\' 3"  (1.6 m), weight 225 lb 12.8 oz (102.422 kg), SpO2 97 %., Body mass index is 40.01 kg/(m^2). General: Well developed, well nourished, in no acute distress. Head: Normocephalic, atraumatic, eyes without discharge, sclera non-icteric, nares are without discharge. Bilateral auditory canals clear, TM's are without perforation, pearly grey and translucent with reflective cone of light bilaterally. Oral cavity moist, posterior pharynx without exudate, erythema, peritonsillar abscess, or post nasal drip.  Neck: Supple. No thyromegaly. Full ROM. No lymphadenopathy. Lungs: Clear bilaterally to auscultation without wheezes, rales, or rhonchi. Breathing is unlabored. Chest: I can feel a 3 cm  mass in the right breast deep to the areola Heart: RRR with S1 S2. No murmurs, rubs, or gallops appreciated. Msk:  Strength and tone normal for age. Extremities/Skin: Warm and dry. No clubbing or cyanosis. No edema. Right elbow has a thickening over the olecranon process, there is a keratin growth on the left side of the left eye. Neuro: Alert and oriented X 3. Moves all extremities spontaneously. Gait is normal. CNII-XII grossly in tact. Psych:  Responds to questions appropriately with a normal affect.  [facility] reading (PRIMARY) by  Dr. Joseph Art:   CXR-->no fluid, nl heart size, no infiltrates no bony lesions.  Left cheek cry'd Labs: Results for orders placed or performed in visit on 04/08/15  Protime-INR  Result Value Ref Range   Protime 21.6 (H) 10.6 - 13.4 Seconds   INR 1.80 (L) 2.00 - 3.50   Lovenox No      ASSESSMENT AND PLAN:  74 y.o. year old female with breast ca who has a variety of other problems she would like  addressed. This chart was scribed in my presence and reviewed by me personally.    ICD-9-CM ICD-10-CM   1. Chest wall pain 786.52 R07.89 Protime-INR     lisinopril (PRINIVIL,ZESTRIL) 10 MG tablet     oxyCODONE (OXY IR/ROXICODONE) 5 MG immediate release tablet     DG Chest 2 View  2. Deep venous thrombosis of arm, unspecified laterality 453.82 I82.629 Protime-INR     lisinopril (PRINIVIL,ZESTRIL) 10 MG tablet     oxyCODONE (OXY IR/ROXICODONE) 5 MG immediate release tablet  3. DVT (deep venous thrombosis), unspecified laterality 453.40 I82.409 Protime-INR     lisinopril (PRINIVIL,ZESTRIL) 10 MG tablet     oxyCODONE (OXY IR/ROXICODONE) 5 MG immediate release tablet  4. History of pulmonary embolus (PE) V12.55 Z86.711 Protime-INR     lisinopril (PRINIVIL,ZESTRIL) 10 MG tablet     oxyCODONE (OXY IR/ROXICODONE) 5 MG immediate release tablet  5. Left leg pain 729.5 M79.605 Protime-INR     lisinopril (PRINIVIL,ZESTRIL) 10 MG tablet     oxyCODONE (OXY IR/ROXICODONE) 5  MG immediate release tablet  6. Malignant neoplasm of right female breast, unspecified site of breast (HCC) 174.9 C50.911 Protime-INR     lisinopril (PRINIVIL,ZESTRIL) 10 MG tablet     oxyCODONE (OXY IR/ROXICODONE) 5 MG immediate release tablet  7. Rash and nonspecific skin eruption 782.1 R21 triamcinolone cream (KENALOG) 0.1 %  8. Dyspnea 786.09 R06.00 DG Chest 2 View  9. Breast lump on right side at 9 o'clock position 611.72 N63 MM Digital Diagnostic Unilat R     US BREAST COMPLETE UNI RIGHT INC AXILLA   By signing my name below, I, Moises Blood, attest that this documentation has been prepared under the direction and in the presence of [name], MD. Electronically Signed: Moises Blood, Scribe. 04/16/2015 , 5:26 PM .  Signed, [name], MD 04/16/2015 5:26 PM

## 2015-04-17 LAB — PROTIME-INR
INR: 2.29 — ABNORMAL HIGH (ref <1.50)
Prothrombin Time: 25.5 seconds — ABNORMAL HIGH (ref 11.6–15.2)

## 2015-04-20 ENCOUNTER — Ambulatory Visit: Payer: Medicare Other | Admitting: Internal Medicine

## 2015-04-22 ENCOUNTER — Other Ambulatory Visit (HOSPITAL_BASED_OUTPATIENT_CLINIC_OR_DEPARTMENT_OTHER): Payer: Medicare Other

## 2015-04-22 ENCOUNTER — Telehealth: Payer: Self-pay | Admitting: *Deleted

## 2015-04-22 DIAGNOSIS — C50919 Malignant neoplasm of unspecified site of unspecified female breast: Secondary | ICD-10-CM

## 2015-04-22 DIAGNOSIS — C50411 Malignant neoplasm of upper-outer quadrant of right female breast: Secondary | ICD-10-CM | POA: Diagnosis not present

## 2015-04-22 DIAGNOSIS — I82402 Acute embolism and thrombosis of unspecified deep veins of left lower extremity: Secondary | ICD-10-CM | POA: Diagnosis present

## 2015-04-22 LAB — CBC WITH DIFFERENTIAL/PLATELET
BASO%: 1.3 % (ref 0.0–2.0)
BASOS ABS: 0.1 10*3/uL (ref 0.0–0.1)
EOS ABS: 0.2 10*3/uL (ref 0.0–0.5)
EOS%: 3.2 % (ref 0.0–7.0)
HCT: 43.1 % (ref 34.8–46.6)
HGB: 14 g/dL (ref 11.6–15.9)
LYMPH%: 19.2 % (ref 14.0–49.7)
MCH: 30.7 pg (ref 25.1–34.0)
MCHC: 32.6 g/dL (ref 31.5–36.0)
MCV: 94.3 fL (ref 79.5–101.0)
MONO#: 0.9 10*3/uL (ref 0.1–0.9)
MONO%: 14.1 % — AB (ref 0.0–14.0)
NEUT#: 3.9 10*3/uL (ref 1.5–6.5)
NEUT%: 62.2 % (ref 38.4–76.8)
Platelets: 223 10*3/uL (ref 145–400)
RBC: 4.57 10*6/uL (ref 3.70–5.45)
RDW: 13.6 % (ref 11.2–14.5)
WBC: 6.2 10*3/uL (ref 3.9–10.3)
lymph#: 1.2 10*3/uL (ref 0.9–3.3)

## 2015-04-22 LAB — PROTIME-INR
INR: 3.1 (ref 2.00–3.50)
Protime: 37.2 Seconds — ABNORMAL HIGH (ref 10.6–13.4)

## 2015-04-22 LAB — COMPREHENSIVE METABOLIC PANEL (CC13)
ALT: 20 U/L (ref 0–55)
AST: 16 U/L (ref 5–34)
Albumin: 3.8 g/dL (ref 3.5–5.0)
Alkaline Phosphatase: 80 U/L (ref 40–150)
Anion Gap: 7 mEq/L (ref 3–11)
BILIRUBIN TOTAL: 0.35 mg/dL (ref 0.20–1.20)
BUN: 16.6 mg/dL (ref 7.0–26.0)
CALCIUM: 10.6 mg/dL — AB (ref 8.4–10.4)
CHLORIDE: 105 meq/L (ref 98–109)
CO2: 27 meq/L (ref 22–29)
CREATININE: 0.8 mg/dL (ref 0.6–1.1)
EGFR: 70 mL/min/{1.73_m2} — ABNORMAL LOW (ref >90)
Glucose: 91 mg/dl (ref 70–140)
Potassium: 4.2 mEq/L (ref 3.5–5.1)
Sodium: 139 mEq/L (ref 136–145)
TOTAL PROTEIN: 6.6 g/dL (ref 6.4–8.3)

## 2015-04-22 NOTE — Telephone Encounter (Signed)
INR 3.1  Current coumadin dose 7.5 mg Thur/Mondays 5mg  all other days  Next lab 12/7.  Per MD no change in current dose. Keep labs as scheduled.  This RN called pt and above verified.

## 2015-05-06 ENCOUNTER — Other Ambulatory Visit (HOSPITAL_BASED_OUTPATIENT_CLINIC_OR_DEPARTMENT_OTHER): Payer: Medicare Other

## 2015-05-06 DIAGNOSIS — C50919 Malignant neoplasm of unspecified site of unspecified female breast: Secondary | ICD-10-CM

## 2015-05-06 DIAGNOSIS — I82402 Acute embolism and thrombosis of unspecified deep veins of left lower extremity: Secondary | ICD-10-CM | POA: Diagnosis present

## 2015-05-06 DIAGNOSIS — C50411 Malignant neoplasm of upper-outer quadrant of right female breast: Secondary | ICD-10-CM

## 2015-05-06 LAB — PROTIME-INR
INR: 2 (ref 2.00–3.50)
PROTIME: 24 s — AB (ref 10.6–13.4)

## 2015-05-20 ENCOUNTER — Other Ambulatory Visit (HOSPITAL_BASED_OUTPATIENT_CLINIC_OR_DEPARTMENT_OTHER): Payer: Medicare Other

## 2015-05-20 DIAGNOSIS — C50411 Malignant neoplasm of upper-outer quadrant of right female breast: Secondary | ICD-10-CM

## 2015-05-20 DIAGNOSIS — I82402 Acute embolism and thrombosis of unspecified deep veins of left lower extremity: Secondary | ICD-10-CM | POA: Diagnosis present

## 2015-05-20 LAB — PROTIME-INR
INR: 1.4 — AB (ref 2.00–3.50)
Protime: 16.8 Seconds — ABNORMAL HIGH (ref 10.6–13.4)

## 2015-05-21 ENCOUNTER — Telehealth: Payer: Self-pay

## 2015-05-21 NOTE — Telephone Encounter (Signed)
INR Today- 1.4 Present dose is 7.5 mg Monday/Thursday 5 mg all other days New dose is 7.5 mg every other day 5 mg all other days per Dr. Jana Hakim Repeat INR in one week

## 2015-05-22 ENCOUNTER — Telehealth: Payer: Self-pay | Admitting: Oncology

## 2015-05-22 NOTE — Telephone Encounter (Signed)
Called and left a message with lab appointment °

## 2015-05-28 ENCOUNTER — Other Ambulatory Visit (HOSPITAL_BASED_OUTPATIENT_CLINIC_OR_DEPARTMENT_OTHER): Payer: Medicare Other

## 2015-05-28 ENCOUNTER — Telehealth: Payer: Self-pay | Admitting: *Deleted

## 2015-05-28 DIAGNOSIS — C50411 Malignant neoplasm of upper-outer quadrant of right female breast: Secondary | ICD-10-CM

## 2015-05-28 DIAGNOSIS — I82402 Acute embolism and thrombosis of unspecified deep veins of left lower extremity: Secondary | ICD-10-CM | POA: Diagnosis present

## 2015-05-28 LAB — PROTIME-INR
INR: 2.3 (ref 2.00–3.50)
Protime: 27.6 Seconds — ABNORMAL HIGH (ref 10.6–13.4)

## 2015-05-28 NOTE — Telephone Encounter (Signed)
INR 2.3 per lab today.  Current coumadin is 7.5 mg alternating with 5 mg.  Next lab is scheduled for 06/08/2014.  This RN called pt - verified above dosage with instructions to continue same dose.  No other needs at this time.

## 2015-06-09 ENCOUNTER — Ambulatory Visit (INDEPENDENT_AMBULATORY_CARE_PROVIDER_SITE_OTHER): Payer: Medicare Other | Admitting: Internal Medicine

## 2015-06-09 ENCOUNTER — Encounter: Payer: Self-pay | Admitting: Internal Medicine

## 2015-06-09 VITALS — BP 122/66 | HR 80 | Temp 97.6°F | Resp 14 | Wt 221.8 lb

## 2015-06-09 DIAGNOSIS — E559 Vitamin D deficiency, unspecified: Secondary | ICD-10-CM

## 2015-06-09 DIAGNOSIS — E213 Hyperparathyroidism, unspecified: Secondary | ICD-10-CM | POA: Diagnosis not present

## 2015-06-09 NOTE — Progress Notes (Signed)
Patient ID: Alicia Vasquez, female   DOB: 10-31-1940, 75 y.o.   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 Last visit 6 mo ago.  Reviewed and addended hx: Pt was dx with found to have an elevated parathyroid hormone in 02/2014. She had 1x high calcium level before her breast surgery: 11 (I could not find this in the records).  A recent calcium was again slightly high, at 10.6 (8.4-10.4).  I reviewed pt's pertinent labs: Lab Results  Component Value Date   PTH 37 12/19/2014   PTH Comment 12/19/2014   PTH 88* 07/27/2014   PTH 76* 07/10/2014   PTH 75* 06/07/2014   PTH 44 04/18/2014   PTH Comment 04/18/2014   PTH 99* 03/17/2014   PTH 64.1 10/18/2012   CALCIUM 10.6* 04/22/2015   CALCIUM 10.1 02/19/2015   CALCIUM 10.4 02/05/2015   CALCIUM 10.1 12/19/2014   CALCIUM 10.6* 11/27/2014   CALCIUM 10.1 08/27/2014   CALCIUM 10.2 07/27/2014   CALCIUM 10.1 07/27/2014   CALCIUM 10.2 07/24/2014   CALCIUM 10.0 07/10/2014   CALCIUM 10.0 07/10/2014   Previous vitamin D was very low: Component     Latest Ref Rng 04/18/2014         3:04 PM  Vitamin D 1, 25 (OH) Total     18 - 72 pg/mL 55  Vitamin D3 1, 25 (OH)      55  Vitamin D2 1, 25 (OH)      <8  VITD     30.00 - 100.00 ng/mL 7.19 (L)  Magnesium     1.5 - 2.5 mg/dL 2.1  Phosphorus     2.3 - 4.6 mg/dL 2.8   Pt had normal calcium levels but elevated PTH when vit D level was low, then PTH normalized with normalization of her vitamin D: Component     Latest Ref Rng 10/17/2014 12/19/2014 12/19/2014          3:07 PM  3:07 PM  Calcium     8.7 - 10.3 mg/dL   10.1  PTH     15 - 65 pg/mL   37  VITD     30.00 - 100.00 ng/mL 26.82 (L) 30.58    + previous DEXA scans - last 09/03/2014: no osteoporosis/but 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  No h/o CKD. Last BUN/Cr: Lab Results  Component Value Date   BUN 16.6 04/22/2015   CREATININE 0.8 04/22/2015   Pt was taking 1000 IU vitamin D in the past - a level was very low then >> we increase the dose to 5000 units daily >> vit D normalized. She continues this today.   She also has a history of breast cancer and is on Arimidex. She had 2 sx this Spring. She had RxTx. No ChTx. She also had a L leg in 2013.   ROS: Constitutional: no weight gain, no fatigue, no hot flushes Eyes: + blurry vision, no xerophthalmia ENT: no sore throat, no nodules palpated in throat, no dysphagia/odynophagia, +  hoarseness Cardiovascular: + CP/no SOB/no palpitations/no leg swelling Respiratory: no cough/SOB Gastrointestinal: no N/V/D/C, no heartburn Musculoskeletal: + muscle aches/+ joint aches Skin: no rashes Neurological: no tremors/numbness/tingling/dizziness, no HA  I reviewed pt's medications, allergies, PMH, social hx, family hx, and changes were documented in  the history of present illness. Otherwise, unchanged from my initial visit note.   Past Medical History  Diagnosis Date  . Allergy   . Hypertension   . Hypothyroid   . Blood transfusion without reported diagnosis   . Pneumonia   . DVT (deep vein thrombosis) in pregnancy   . Breast cancer ([place]) 07/12/13    invasive mammary carcinoma  . Anxiety   . Arthritis   . Radiation 11/21/13-01/07/14    Right Breast/supraclav.   Past Surgical History  Procedure Laterality Date  . Removal of teeth  04/19/2012    13 teeth removed  . Biospy      female organs  . Dilation and curettage of uterus    . Breast lumpectomy with radioactive seed localization Right 09/09/2013    Procedure: BREAST LUMPECTOMY WITH RADIOACTIVE SEED LOCALIZATION WITH AXILLARY NODE EXCISION;  Surgeon: [name], MD;  Location: [facility];  Service: General;  Laterality: Right;  . Re-excision of breast lumpectomy Right 09/24/2013     Procedure: RE-EXCISION OF RIGHT BREAST LUMPECTOMY;  Surgeon: [name], MD;  Location: [facility];  Service: General;  Laterality: Right;   History   Social History  . Marital Status: Widowed    Spouse Name: N/A    Number of Children: 1   Occupational History  . Retired Radio broadcast assistant   Social History Main Topics  . Smoking status: Never Smoker   . Smokeless tobacco: Not on file  . Alcohol Use: No  . Drug Use: No   Social History Narrative   Exercise yard work   Current Outpatient Prescriptions on File Prior to Visit  Medication Sig Dispense Refill  . acetaminophen (TYLENOL) 500 MG tablet Take 500 mg by mouth every 6 (six) hours as needed for mild pain, fever or headache.    . anastrozole (ARIMIDEX) 1 MG tablet Take 1 tablet (1 mg total) by mouth daily. 90 tablet 4  . Cholecalciferol (VITAMIN D3) 5000 UNITS TABS Take by mouth.    . clindamycin (CLEOCIN) 150 MG capsule Take 1 capsule (150 mg total) by mouth 3 (three) times daily. 21 capsule 0  . levothyroxine (SYNTHROID) 125 MCG tablet Take 1 tablet (125 mcg total) by mouth daily before breakfast. Dispense as written: Synthroid 90 tablet 3  . lisinopril (PRINIVIL,ZESTRIL) 10 MG tablet TAKE 1 TABLET BY MOUTH DAILY  "OFFICE VISIT NEEDED FOR REFILLS" 30 tablet 0  . montelukast (SINGULAIR) 10 MG tablet Take 1 tablet (10 mg total) by mouth at bedtime. 30 tablet 3  . non-metallic deodorant (ALRA) MISC Apply 1 application topically daily as needed.    Marland Kitchen oxyCODONE (OXY IR/ROXICODONE) 5 MG immediate release tablet Take 1 tablet (5 mg total) by mouth every 6 (six) hours as needed for severe pain. 30 tablet 0  . triamcinolone cream (KENALOG) 0.1 % Apply 1 application topically 2 (two) times daily. 30 g 0  . warfarin (JANTOVEN) 5 MG tablet TAKE AS DIRECTED BY ANTICOAGULATION CLINIC (Patient taking differently: 5 mg. Pt takes 1mg  alternating with 5 mg every other day.) 60 tablet 3   No current facility-administered medications  on file prior to visit.   Allergies  Allergen Reactions  . Anesthetics, Amide Other (See Comments)    ELEVATE BLOOD PRESSURE  . Benadryl [Diphenhydramine Hcl]     dizziness  . Carbocaine [Mepivacaine Hcl] Other (See Comments)    Elevated blood pressure  . Codeine Nausea Only and Other (See Comments)    dizziness  . Epinephrine Other (  See Comments)    Elevated blood pressure  . Sulfa Antibiotics Other (See Comments)    dizziness  . Vicodin [Hydrocodone-Acetaminophen] Nausea Only  . Penicillins Rash   Family History  Problem Relation Age of Onset  . Cancer Brother     prostate  . Heart disease Brother   . Colon cancer Brother    PE: BP 122/66 mmHg  Pulse 80  Temp(Src) 97.6 F (36.4 C) (Oral)  Resp 14  Wt 221 lb 12.8 oz (100.608 kg)  SpO2 95% Body mass index is 39.3 kg/(m^2). Wt Readings from Last 3 Encounters:  06/09/15 221 lb 12.8 oz (100.608 kg)  04/16/15 225 lb 12.8 oz (102.422 kg)  02/27/15 226 lb 6.4 oz (102.694 kg)   Constitutional: overweight, in NAD. No kyphosis. Eyes: PERRLA, EOMI, no exophthalmos ENT: moist mucous membranes, no thyromegaly, no cervical lymphadenopathy Cardiovascular: RRR, No MRG Respiratory: CTA B Gastrointestinal: abdomen soft, NT, ND, BS+ Musculoskeletal: no deformities, strength intact in all 4 Skin: moist, warm, no rashes Neurological: no tremor with outstretched hands, DTR normal in all 4  Assessment: 1. Normocalcemic hyperparathyroidism  2. Vitamin D deficiency - Severe    Plan: 1. Hyperparathyroidism  Patient has had normal calcium levels, and an occasional high Calcium level, latest 10.6.  An intact PTH level was also high, but normalized after vit D normalized on vit D3 5000 units daily. She continues on this dose.  - No apparent complications from hypercalcemia: no h/o nephrolithiasis, no osteoporosis, had 1 traumatic fracture. No abdominal pain, depression, bone pain. - we discussed that the slightly elevated calcium can  just be monitored, but I do not feel she would greatly benefit from further investigation and a possible parathyroid sx. She agrees with the plan. - will check: Orders Placed This Encounter  Procedures  . PTH, Intact and Calcium  . VITAMIN D 25 Hydroxy (Vit-D Deficiency, Fractures)  - I will see the patient back in 6 months  2. Severe vitamin D deficiency - improving - She is on 5000 units of vitamin D daily, which we will continue today  - Check vitamin D level today   Office Visit on 06/09/2015  Component Date Value Ref Range Status  . Calcium 06/09/2015 10.5* 8.7 - 10.3 mg/dL Final  . PTH 06/09/2015 37  15 - 65 pg/mL Final  . PTH 06/09/2015 Comment   Final   Comment: Interpretation                 Intact PTH    Calcium                                 (pg/mL)      (mg/dL) Normal                          15 - 65     8.6 - 10.2 Primary Hyperparathyroidism         >65          >10.2 Secondary Hyperparathyroidism       >65          <10.2 Non-Parathyroid Hypercalcemia       <65          >10.2 Hypoparathyroidism                  <15          < 8.6 Non-Parathyroid Hypocalcemia  15 - 65          < 8.6   . VITD 06/09/2015 45.83  30.00 - 100.00 ng/mL Final   Normal vitamin D. Continue current vit D dose. Ca slightly high. Normal PTH (but not suppressed). Will continue to follow her expectantly.

## 2015-06-09 NOTE — Patient Instructions (Signed)
Please stop at the lab.  Continue 5000 units vitamin D a day for now.   Please come back for a follow-up appointment in 6 months.

## 2015-06-10 ENCOUNTER — Other Ambulatory Visit: Payer: Self-pay | Admitting: Family Medicine

## 2015-06-10 LAB — VITAMIN D 25 HYDROXY (VIT D DEFICIENCY, FRACTURES): VITD: 45.83 ng/mL (ref 30.00–100.00)

## 2015-06-11 ENCOUNTER — Other Ambulatory Visit (HOSPITAL_BASED_OUTPATIENT_CLINIC_OR_DEPARTMENT_OTHER): Payer: Medicare Other

## 2015-06-11 DIAGNOSIS — I82402 Acute embolism and thrombosis of unspecified deep veins of left lower extremity: Secondary | ICD-10-CM | POA: Diagnosis not present

## 2015-06-11 DIAGNOSIS — C50411 Malignant neoplasm of upper-outer quadrant of right female breast: Secondary | ICD-10-CM

## 2015-06-11 LAB — PTH, INTACT AND CALCIUM
CALCIUM: 10.5 mg/dL — AB (ref 8.7–10.3)
PTH: 37 pg/mL (ref 15–65)

## 2015-06-11 LAB — PROTIME-INR
INR: 1.7 — AB (ref 2.00–3.50)
Protime: 20.4 Seconds — ABNORMAL HIGH (ref 10.6–13.4)

## 2015-06-12 ENCOUNTER — Other Ambulatory Visit: Payer: Self-pay

## 2015-06-12 ENCOUNTER — Telehealth: Payer: Self-pay

## 2015-06-12 DIAGNOSIS — I82402 Acute embolism and thrombosis of unspecified deep veins of left lower extremity: Secondary | ICD-10-CM

## 2015-06-12 DIAGNOSIS — C50411 Malignant neoplasm of upper-outer quadrant of right female breast: Secondary | ICD-10-CM

## 2015-06-12 NOTE — Telephone Encounter (Signed)
INR yesterday - 1.7 Present dose 7.5 mg alt 5 mg Adjusted dose per Dr. Jana Hakim is to increase coumadin to 7.5 mg for three days then on the 4th day to resume to alt

## 2015-06-12 NOTE — Telephone Encounter (Signed)
Per last note patient is to increase her coumadin to 7.5 mg on 1/13, 1/14, and 1/15 then resume to alternating 7.5 mg and 5 mg.  Patient to have lab and Dr. Jana Hakim on 06/18/15.  Patient stated understanding.

## 2015-06-14 ENCOUNTER — Telehealth: Payer: Self-pay | Admitting: Oncology

## 2015-06-14 NOTE — Telephone Encounter (Signed)
Labs added per pof and per pof patient is aware

## 2015-06-16 ENCOUNTER — Encounter: Payer: Self-pay | Admitting: *Deleted

## 2015-06-18 ENCOUNTER — Ambulatory Visit (HOSPITAL_BASED_OUTPATIENT_CLINIC_OR_DEPARTMENT_OTHER): Payer: Medicare Other | Admitting: Oncology

## 2015-06-18 ENCOUNTER — Other Ambulatory Visit (HOSPITAL_BASED_OUTPATIENT_CLINIC_OR_DEPARTMENT_OTHER): Payer: Medicare Other

## 2015-06-18 ENCOUNTER — Telehealth: Payer: Self-pay | Admitting: Oncology

## 2015-06-18 VITALS — BP 142/49 | HR 69 | Temp 97.4°F | Resp 18 | Ht 63.0 in | Wt 220.9 lb

## 2015-06-18 DIAGNOSIS — E213 Hyperparathyroidism, unspecified: Secondary | ICD-10-CM

## 2015-06-18 DIAGNOSIS — I82403 Acute embolism and thrombosis of unspecified deep veins of lower extremity, bilateral: Secondary | ICD-10-CM | POA: Diagnosis not present

## 2015-06-18 DIAGNOSIS — I82402 Acute embolism and thrombosis of unspecified deep veins of left lower extremity: Secondary | ICD-10-CM | POA: Diagnosis not present

## 2015-06-18 DIAGNOSIS — C50411 Malignant neoplasm of upper-outer quadrant of right female breast: Secondary | ICD-10-CM

## 2015-06-18 DIAGNOSIS — I82542 Chronic embolism and thrombosis of left tibial vein: Secondary | ICD-10-CM | POA: Insufficient documentation

## 2015-06-18 DIAGNOSIS — I82409 Acute embolism and thrombosis of unspecified deep veins of unspecified lower extremity: Secondary | ICD-10-CM | POA: Insufficient documentation

## 2015-06-18 LAB — COMPREHENSIVE METABOLIC PANEL
ALK PHOS: 88 U/L (ref 40–150)
ALT: 22 U/L (ref 0–55)
ANION GAP: 8 meq/L (ref 3–11)
AST: 17 U/L (ref 5–34)
Albumin: 3.9 g/dL (ref 3.5–5.0)
BILIRUBIN TOTAL: 0.31 mg/dL (ref 0.20–1.20)
BUN: 17.4 mg/dL (ref 7.0–26.0)
CO2: 26 meq/L (ref 22–29)
Calcium: 10.5 mg/dL — ABNORMAL HIGH (ref 8.4–10.4)
Chloride: 105 mEq/L (ref 98–109)
Creatinine: 0.8 mg/dL (ref 0.6–1.1)
EGFR: 68 mL/min/{1.73_m2} — AB (ref >90)
GLUCOSE: 109 mg/dL (ref 70–140)
POTASSIUM: 4.3 meq/L (ref 3.5–5.1)
SODIUM: 139 meq/L (ref 136–145)
TOTAL PROTEIN: 7 g/dL (ref 6.4–8.3)

## 2015-06-18 LAB — CBC WITH DIFFERENTIAL/PLATELET
BASO%: 0.8 % (ref 0.0–2.0)
BASOS ABS: 0.1 10*3/uL (ref 0.0–0.1)
EOS ABS: 0.3 10*3/uL (ref 0.0–0.5)
EOS%: 4.5 % (ref 0.0–7.0)
HCT: 43.4 % (ref 34.8–46.6)
HGB: 14.2 g/dL (ref 11.6–15.9)
LYMPH#: 1.5 10*3/uL (ref 0.9–3.3)
LYMPH%: 20.5 % (ref 14.0–49.7)
MCH: 31.1 pg (ref 25.1–34.0)
MCHC: 32.7 g/dL (ref 31.5–36.0)
MCV: 95 fL (ref 79.5–101.0)
MONO#: 0.9 10*3/uL (ref 0.1–0.9)
MONO%: 12.5 % (ref 0.0–14.0)
NEUT%: 61.7 % (ref 38.4–76.8)
NEUTROS ABS: 4.4 10*3/uL (ref 1.5–6.5)
PLATELETS: 220 10*3/uL (ref 145–400)
RBC: 4.57 10*6/uL (ref 3.70–5.45)
RDW: 13.9 % (ref 11.2–14.5)
WBC: 7.1 10*3/uL (ref 3.9–10.3)

## 2015-06-18 LAB — PROTIME-INR
INR: 2 (ref 2.00–3.50)
Protime: 24 Seconds — ABNORMAL HIGH (ref 10.6–13.4)

## 2015-06-18 NOTE — Telephone Encounter (Signed)
Gave patient avs report and appointments for January thru april

## 2015-06-18 NOTE — Progress Notes (Signed)
Alicia Vasquez  [phone] [phone]     ID: Falcon OB: [birth year]  MR#: [id number]  BTC#:[id number]  PCP: [name], MD GYN:   SU: [name] OTHER MD: Thea Silversmith, Hart Robinsons, Philemon Kingdom  CHIEF COMPLAINT: Estrogen receptor positive breast cancer  CURRENT TREATMENT: observation  BREAST CANCER HISTORY: From doctor [name]'s intake node 07/24/2013:  "75 y.o. female. Who presented with SOB and had a CT chest performed that revealed a right breast mass. Mammogram/ultrsound on 2/13 showed a mass in the 11 o'clock position in the right breast measuring 1.5 cm. Also noted was a right axillary LN measuring 1.6 cm. MRI not performed. Biopsy of mass and lymph done. 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 Lymph node + for metastatic carcinoma."  [On 09/09/2013 the patient underwent right lumpectomy and sentinel lymph node sampling. This showed ([id number]) multifocal invasive ductal carcinoma, grade 1, the largest lesion measuring 1.8 cm, the second lesion 1.2 cm. One of 4 sentinel lymph nodes was positive, with extracapsular extension. Margins were positive. HER-2 was repeated and was again negative. Further surgery 09/16/2013 obtained clear margins.  Her subsequent history is as detailed below  INTERVAL HISTORY: Alicia Vasquez returns today for follow up of her estrogen receptor positive breast cancer. At the last visit we emphasized the need to get going on anti-estrogens , and she got a prescription for anastrozole which she purchased ( $18 for 30 tablets). However she never started it. She says the pharmacy told her that it would not be compatible with her other medications.   We are also monitoring her Coumadin. Today her INR is 2.0. She is on 7.5/7.5/5 mg. She has had Vasquez bleeding complications so far.  REVIEW OF  SYSTEMS: Alicia Vasquez tired all the time. When she sits down she feels like she is short of breath. She does not have chest pain or angina. Her left leg where she has the clot is better but "it still hurts". She says her endocrine problems are now perfectly corrected.  She complains of swelling in the right chest wall area where she had her surgery. She recently opened the door that was stuck and "jostled all that area" so there is a little bit of discomfort again in the right upper anterior chest. She has chronic low back pain. She is not exercising on a regular basis. A detailed review of systems today was otherwise stable.  PAST MEDICAL HISTORY: Past Medical History  Diagnosis Date  . Allergy   . Hypertension   . Hypothyroid   . Blood transfusion without reported diagnosis   . Pneumonia   . DVT (deep vein thrombosis) in pregnancy   . Breast cancer ([place]) 07/12/13    invasive mammary carcinoma  . Anxiety   . Arthritis   . Radiation 11/21/13-01/07/14    Right Breast/supraclav.    PAST SURGICAL HISTORY: Past Surgical History  Procedure Laterality Date  . Removal of teeth  04/19/2012    13 teeth removed  . Biospy      female organs  . Dilation and curettage of uterus    . Breast lumpectomy with radioactive seed localization Right 09/09/2013    Procedure: BREAST LUMPECTOMY WITH RADIOACTIVE SEED LOCALIZATION WITH AXILLARY NODE EXCISION;  Surgeon: [name], MD;  Location: [facility];  Service: General;  Laterality: Right;  . Re-excision of breast lumpectomy Right 09/24/2013  Procedure: RE-EXCISION OF RIGHT BREAST LUMPECTOMY;  Surgeon: [name], MD;  Location: [facility];  Service: General;  Laterality: Right;    FAMILY HISTORY Family History  Problem Relation Age of Onset  . Cancer Brother     prostate  . Heart disease Brother   . Colon cancer Brother    the patient's father died at the age of 77 after an automobile accident. The  patient's mother died at the age of 37. She was a Marine scientist here in [name] in the old [facility]. She was infected with polio and was confined to a wheelchair for a good part of her life. She eventually died of pneumonia. The patient had one brother, who died with prostate cancer. She had Vasquez sisters. There is Vasquez history of breast or ovarian cancer in the family.  GYNECOLOGIC HISTORY:  Menarche age 21, first live birth age 46, the patient is GX P1. She went through the change of life at age 9. 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 She is widowed. 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 The patient's son Alicia Vasquez lives in [place]. He works an Engineer, technical sales. The patient has Vasquez grandchildren. She is a Tourist information centre manager but currently attends a [employer] with her friend Alicia Vasquez    ADVANCED DIRECTIVES: Not in place   HEALTH MAINTENANCE: Social History  Substance Use Topics  . Smoking status: Never Smoker   . Smokeless tobacco: Never Used  . Alcohol Use: Vasquez     Colonoscopy: Never  PAP:  Bone density: 10/01/2009; lowest T score -0.8  Lipid panel:  Allergies  Allergen Reactions  . Anesthetics, Amide Other (See Comments)    ELEVATE BLOOD PRESSURE  . Benadryl [Diphenhydramine Hcl]     dizziness  . Carbocaine [Mepivacaine Hcl] Other (See Comments)    Elevated blood pressure  . Codeine Nausea Only and Other (See Comments)    dizziness  . Epinephrine Other (See Comments)    Elevated blood pressure  . Sulfa Antibiotics Other (See Comments)    dizziness  . Vicodin [Hydrocodone-Acetaminophen] Nausea Only  . Penicillins Rash    Current Outpatient Prescriptions  Medication Sig Dispense Refill  . acetaminophen (TYLENOL) 500 MG tablet Take 500 mg by mouth every 6 (six) hours as needed for mild pain, fever or headache.    . anastrozole (ARIMIDEX) 1 MG tablet Take 1  tablet (1 mg total) by mouth daily. 90 tablet 4  . Cholecalciferol (VITAMIN D3) 5000 UNITS TABS Take by mouth.    . clindamycin (CLEOCIN) 150 MG capsule Take 1 capsule (150 mg total) by mouth 3 (three) times daily. 21 capsule 0  . levothyroxine (SYNTHROID) 125 MCG tablet Take 1 tablet (125 mcg total) by mouth daily before breakfast. Dispense as written: Synthroid 90 tablet 3  . lisinopril (PRINIVIL,ZESTRIL) 10 MG tablet TAKE 1 TABLET BY MOUTH DAILY "OFFICE VISIT NEEDED FOR REFILLS" 30 tablet 0  . montelukast (SINGULAIR) 10 MG tablet Take 1 tablet (10 mg total) by mouth at bedtime. 30 tablet 3  . non-metallic deodorant (ALRA) MISC Apply 1 application topically daily as needed.    Marland Kitchen oxyCODONE (OXY IR/ROXICODONE) 5 MG immediate release tablet Take 1 tablet (5 mg total) by mouth every 6 (six) hours as needed for severe pain. 30 tablet 0  . triamcinolone cream (KENALOG) 0.1 % Apply 1 application topically 2 (two) times daily.  30 g 0  . warfarin (JANTOVEN) 5 MG tablet TAKE AS DIRECTED BY ANTICOAGULATION CLINIC (Patient taking differently: 5 mg. Pt takes 5m alternating with 5 mg every other day.) 60 tablet 3   Vasquez current facility-administered medications for this visit.    OBJECTIVE: Older white woman who appears stated age  F51Vitals:   06/18/15 1503  BP: 142/49  Pulse: 69  Temp: 97.4 F (36.3 C)  Resp: 18     Body mass index is 39.14 kg/(m^2).    ECOG FS:1 - Symptomatic but completely ambulatory  Sclerae unicteric, EOMs intact Oropharynx clear, dentition in  poor repair Vasquez cervical or supraclavicular adenopathy Lungs Vasquez rales or rhonchi Heart regular rate and rhythm Abd soft,  Obese,nontender, positive bowel sounds MSK  Scoliosis butno focal spinal tenderness, Vasquez lower extremity lymphedema Neuro: nonfocal, well oriented,  anxious affect Breasts:  The right breast is status post lumpectomy and radiation. There is Vasquez evidence of local recurrence. The right axilla is benign. The left  breast is unremarkable.   LAB RESULTS:  CMP     Component Value Date/Time   NA 139 06/18/2015 1436   NA 139 02/05/2015 1821   K 4.3 06/18/2015 1436   K 4.5 02/05/2015 1821   CL 105 02/05/2015 1821   CO2 26 06/18/2015 1436   CO2 30 02/05/2015 1821   GLUCOSE 109 06/18/2015 1436   GLUCOSE 94 02/05/2015 1821   BUN 17.4 06/18/2015 1436   BUN 21 02/05/2015 1821   CREATININE 0.8 06/18/2015 1436   CREATININE 0.83 02/05/2015 1821   CREATININE 0.77 09/05/2013 1330   CALCIUM 10.5* 06/18/2015 1436   CALCIUM 10.5* 06/09/2015 1613   PROT 7.0 06/18/2015 1436   PROT 6.9 02/05/2015 1821   ALBUMIN 3.9 06/18/2015 1436   ALBUMIN 4.3 02/05/2015 1821   AST 17 06/18/2015 1436   AST 16 02/05/2015 1821   ALT 22 06/18/2015 1436   ALT 19 02/05/2015 1821   ALKPHOS 88 06/18/2015 1436   ALKPHOS 73 02/05/2015 1821   BILITOT 0.31 06/18/2015 1436   BILITOT 0.4 02/05/2015 1821   GFRNONAA 69 02/05/2015 1821   GFRNONAA 81* 09/05/2013 1330   GFRAA 80 02/05/2015 1821   GFRAA >90 09/05/2013 1330    I Vasquez results found for: SPEP  Lab Results  Component Value Date   WBC 7.1 06/18/2015   NEUTROABS 4.4 06/18/2015   HGB 14.2 06/18/2015   HCT 43.4 06/18/2015   MCV 95.0 06/18/2015   PLT 220 06/18/2015      Chemistry      Component Value Date/Time   NA 139 06/18/2015 1436   NA 139 02/05/2015 1821   K 4.3 06/18/2015 1436   K 4.5 02/05/2015 1821   CL 105 02/05/2015 1821   CO2 26 06/18/2015 1436   CO2 30 02/05/2015 1821   BUN 17.4 06/18/2015 1436   BUN 21 02/05/2015 1821   CREATININE 0.8 06/18/2015 1436   CREATININE 0.83 02/05/2015 1821   CREATININE 0.77 09/05/2013 1330      Component Value Date/Time   CALCIUM 10.5* 06/18/2015 1436   CALCIUM 10.5* 06/09/2015 1613   ALKPHOS 88 06/18/2015 1436   ALKPHOS 73 02/05/2015 1821   AST 17 06/18/2015 1436   AST 16 02/05/2015 1821   ALT 22 06/18/2015 1436   ALT 19 02/05/2015 1821   BILITOT 0.31 06/18/2015 1436   BILITOT 0.4 02/05/2015 1821        Vasquez results found for: LABCA2  Vasquez components found for: [id number]  Recent Labs Lab 06/18/15 1436  INR 2.00    Urinalysis    Component Value Date/Time   BILIRUBINUR negative 02/27/2015 1714   BILIRUBINUR neg 02/13/2015 1211   KETONESUR negative 02/27/2015 1714   PROTEINUR neg 02/13/2015 1211   UROBILINOGEN 0.2 02/27/2015 1714   NITRITE Negative 02/27/2015 1714   NITRITE neg 02/13/2015 1211   LEUKOCYTESUR Negative 02/27/2015 1714    STUDIES:  CLINICAL DATA: Right breast pain. Personal history of radiation at this location for breast cancer. Initial encounter.  EXAM: CHEST 2 VIEW  COMPARISON: Chest radiograph performed 02/05/2015, and CT of the chest performed 02/20/2015  FINDINGS: The lungs are well-aerated and clear. There is Vasquez evidence of focal opacification, pleural effusion or pneumothorax.  The heart is normal in size; the mediastinal contour is within normal limits. Vasquez acute osseous abnormalities are seen. Scattered clips are seen at the right axilla.  IMPRESSION: Vasquez acute cardiopulmonary process seen.   Electronically Signed  By: Garald Balding M.D.  On: 04/16/2015 18:06    ASSESSMENT: 75 y.o. Alicia Vasquez woman status post right lumpectomy and sentinel lymph node sampling 09/09/2013 for an mpT1c pN1a, stage IIA invasive ductal carcinoma, estrogen and progesterone receptor both 100% positive with strong staining intensity, MIB-1 of 17% and Vasquez HER-2 amplification  (1) additional surgery for margin clearance 09/16/2013 obtained negative margins  (2) Oncotype DX recurrence score of 4 predicts a risk of outside the breast recurrence within 10 years of 7% if the patient's only systemic therapy is tamoxifen for 5 years. It also predicts Vasquez benefit from chemotherapy  (3) additional right breast surgery for margin clearance was successful, 09/16/2048  (4) adjuvant radiation completed 01/07/2014  (5) anastrozole started 02/27/2014 stopped  within 2 weeks because of arm swelling.   (a) bone density April 2016 showed osteopenia, with a t-score of -1.6  PLAN: Alicia Vasquez  At least cut herself to by the medication. Now that she brought it she is thinking that she might take it. I reassured her that it will not affect her other medications or cause shortness of breath or some of the other symptoms that she was worried about.   At this point I am beginning to suspect that Alicia Vasquez really is never going to get going on anti-estrogens. She understands these drugs we'll cut the risk of breast cancer in half, and by avoiding them she is placing herself at higher risk of disease recurrence.   if she ever does start the anastrozole we will have to worry about her osteopenia, because with her history of hyperparathyroidism bone density loss is a major concern.   we are going to recheck her Coumadin late next week and if it stays in the therapeutic range we will move to every month checks.  She has very close follow-up with multiple other physicians.  She will see [name] again in 3 months , and if she still has not started the anastrozole at that point we will probably start seeing  Her on an every 6 month basis for the next year, and then at increasing intervals.  Chauncey Cruel, MD   06/18/2015 3:19 PM

## 2015-06-19 ENCOUNTER — Telehealth: Payer: Self-pay

## 2015-06-19 NOTE — Telephone Encounter (Signed)
Patient's INR today-2.0 Present Dose- 5mg  alt days with 7.5 mg Patient to stay on same dose Repeat Lab 06/25/15 Patient states understanding

## 2015-06-25 ENCOUNTER — Other Ambulatory Visit (HOSPITAL_BASED_OUTPATIENT_CLINIC_OR_DEPARTMENT_OTHER): Payer: Medicare Other

## 2015-06-25 ENCOUNTER — Telehealth: Payer: Self-pay | Admitting: *Deleted

## 2015-06-25 DIAGNOSIS — E213 Hyperparathyroidism, unspecified: Secondary | ICD-10-CM

## 2015-06-25 DIAGNOSIS — C50411 Malignant neoplasm of upper-outer quadrant of right female breast: Secondary | ICD-10-CM

## 2015-06-25 DIAGNOSIS — I82402 Acute embolism and thrombosis of unspecified deep veins of left lower extremity: Secondary | ICD-10-CM | POA: Diagnosis not present

## 2015-06-25 LAB — PROTIME-INR
INR: 2.2 (ref 2.00–3.50)
Protime: 26.4 Seconds — ABNORMAL HIGH (ref 10.6–13.4)

## 2015-06-25 NOTE — Telephone Encounter (Signed)
INR today is 2.2  Coumadin dose 5 mg alt 7.5 mg  Next lab scheduled 2/23  No change per MD.  Message left on identified VM with request to return call.

## 2015-07-13 ENCOUNTER — Other Ambulatory Visit: Payer: Self-pay | Admitting: Family Medicine

## 2015-07-16 ENCOUNTER — Telehealth: Payer: Self-pay

## 2015-07-16 NOTE — Telephone Encounter (Signed)
Patient states that she needs her warfarin (JANTOVEN) 5 MG tablet refilled, she states she has 2 pills left, apparently she contacted the pharmacy to get this refilled but I do not see anything in here for this. She would like it sent to the [facility].

## 2015-07-17 NOTE — Telephone Encounter (Signed)
Dr L, do you want to RF warfarin, or does pt need labs? We did not receive any request for warfarin from the pharm.

## 2015-07-18 ENCOUNTER — Other Ambulatory Visit: Payer: Self-pay | Admitting: Family Medicine

## 2015-07-18 DIAGNOSIS — Z86711 Personal history of pulmonary embolism: Secondary | ICD-10-CM

## 2015-07-18 DIAGNOSIS — I82402 Acute embolism and thrombosis of unspecified deep veins of left lower extremity: Secondary | ICD-10-CM

## 2015-07-18 DIAGNOSIS — I82409 Acute embolism and thrombosis of unspecified deep veins of unspecified lower extremity: Secondary | ICD-10-CM

## 2015-07-18 MED ORDER — WARFARIN SODIUM 5 MG PO TABS: ORAL_TABLET | ORAL | Status: DC

## 2015-07-23 ENCOUNTER — Other Ambulatory Visit (HOSPITAL_BASED_OUTPATIENT_CLINIC_OR_DEPARTMENT_OTHER): Payer: Medicare Other

## 2015-07-23 DIAGNOSIS — I82402 Acute embolism and thrombosis of unspecified deep veins of left lower extremity: Secondary | ICD-10-CM | POA: Diagnosis not present

## 2015-07-23 DIAGNOSIS — C50411 Malignant neoplasm of upper-outer quadrant of right female breast: Secondary | ICD-10-CM | POA: Diagnosis not present

## 2015-07-23 DIAGNOSIS — E213 Hyperparathyroidism, unspecified: Secondary | ICD-10-CM

## 2015-07-23 LAB — CBC WITH DIFFERENTIAL/PLATELET
BASO%: 1.2 % (ref 0.0–2.0)
Basophils Absolute: 0.1 10*3/uL (ref 0.0–0.1)
EOS%: 3.9 % (ref 0.0–7.0)
Eosinophils Absolute: 0.3 10*3/uL (ref 0.0–0.5)
HEMATOCRIT: 43.1 % (ref 34.8–46.6)
HEMOGLOBIN: 14.2 g/dL (ref 11.6–15.9)
LYMPH#: 1.4 10*3/uL (ref 0.9–3.3)
LYMPH%: 20.7 % (ref 14.0–49.7)
MCH: 30.7 pg (ref 25.1–34.0)
MCHC: 33 g/dL (ref 31.5–36.0)
MCV: 93 fL (ref 79.5–101.0)
MONO#: 1.1 10*3/uL — ABNORMAL HIGH (ref 0.1–0.9)
MONO%: 15.7 % — AB (ref 0.0–14.0)
NEUT#: 4.1 10*3/uL (ref 1.5–6.5)
NEUT%: 58.5 % (ref 38.4–76.8)
Platelets: 214 10*3/uL (ref 145–400)
RBC: 4.63 10*6/uL (ref 3.70–5.45)
RDW: 14 % (ref 11.2–14.5)
WBC: 7 10*3/uL (ref 3.9–10.3)

## 2015-07-23 LAB — COMPREHENSIVE METABOLIC PANEL
ALBUMIN: 3.8 g/dL (ref 3.5–5.0)
ALK PHOS: 84 U/L (ref 40–150)
ALT: 20 U/L (ref 0–55)
AST: 16 U/L (ref 5–34)
Anion Gap: 7 mEq/L (ref 3–11)
BUN: 17.1 mg/dL (ref 7.0–26.0)
CALCIUM: 10.5 mg/dL — AB (ref 8.4–10.4)
CHLORIDE: 105 meq/L (ref 98–109)
CO2: 28 mEq/L (ref 22–29)
CREATININE: 1.6 mg/dL — AB (ref 0.6–1.1)
EGFR: 31 mL/min/{1.73_m2} — ABNORMAL LOW (ref >90)
GLUCOSE: 93 mg/dL (ref 70–140)
Potassium: 4.5 mEq/L (ref 3.5–5.1)
SODIUM: 139 meq/L (ref 136–145)
Total Bilirubin: 0.32 mg/dL (ref 0.20–1.20)
Total Protein: 6.9 g/dL (ref 6.4–8.3)

## 2015-07-23 LAB — PROTIME-INR
INR: 2.5 (ref 2.00–3.50)
PROTIME: 30 s — AB (ref 10.6–13.4)

## 2015-08-04 ENCOUNTER — Other Ambulatory Visit: Payer: Self-pay | Admitting: Nurse Practitioner

## 2015-08-04 DIAGNOSIS — Z9889 Other specified postprocedural states: Secondary | ICD-10-CM

## 2015-08-04 DIAGNOSIS — Z853 Personal history of malignant neoplasm of breast: Secondary | ICD-10-CM

## 2015-08-06 ENCOUNTER — Ambulatory Visit (INDEPENDENT_AMBULATORY_CARE_PROVIDER_SITE_OTHER): Payer: Medicare Other | Admitting: Family Medicine

## 2015-08-06 VITALS — BP 120/68 | HR 87 | Temp 97.4°F | Resp 20 | Ht 64.0 in | Wt 217.6 lb

## 2015-08-06 DIAGNOSIS — I82629 Acute embolism and thrombosis of deep veins of unspecified upper extremity: Secondary | ICD-10-CM

## 2015-08-06 DIAGNOSIS — C50911 Malignant neoplasm of unspecified site of right female breast: Secondary | ICD-10-CM

## 2015-08-06 DIAGNOSIS — M5010 Cervical disc disorder with radiculopathy, unspecified cervical region: Secondary | ICD-10-CM

## 2015-08-06 DIAGNOSIS — M79605 Pain in left leg: Secondary | ICD-10-CM | POA: Diagnosis not present

## 2015-08-06 DIAGNOSIS — I82409 Acute embolism and thrombosis of unspecified deep veins of unspecified lower extremity: Secondary | ICD-10-CM | POA: Diagnosis not present

## 2015-08-06 DIAGNOSIS — R0789 Other chest pain: Secondary | ICD-10-CM | POA: Diagnosis not present

## 2015-08-06 DIAGNOSIS — Z86711 Personal history of pulmonary embolism: Secondary | ICD-10-CM

## 2015-08-06 MED ORDER — METHYLPREDNISOLONE 4 MG PO TABS: 4.0000 mg | ORAL_TABLET | Freq: Two times a day (BID) | ORAL | Status: DC

## 2015-08-06 NOTE — Progress Notes (Signed)
Subjective:  This chart was scribed for Alicia Haber MD, by Alicia Vasquez, at [facility].  This patient was seen in room 10 and the patient's care was started at 2:18 PM.   Chief Complaint  Patient presents with  . OTHER    R arm sweeling, red spot      Patient ID: Alicia Vasquez, female    DOB: 1940-06-24, 75 y.o.   MRN: [id number]  HPI HPI Comments: Alicia Vasquez is a 75 y.o. female who presents to the [facility] complaining of right arm swelling/soreness onset about 6 days ago.  She states that when she woke up this morning, 2 of her fingers felt numb.  Patient has slight neck and back pain.  She is unsure of why it started and denies any recent injury.  Her visit for her breast cancer follow up is April 7th for a mammogram.   Patient Active Problem List   Diagnosis Date Noted  . DVT, lower extremity ([place]) 06/18/2015  . Bilateral knee pain 04/03/2015  . Arm edema 08/28/2014  . Vitamin D deficiency 04/23/2014  . Hyperparathyroidism ([place]) 04/23/2014  . Depression 07/18/2013  . Breast cancer of upper-outer quadrant of right female breast ([place]) 07/15/2013  . Encounter for therapeutic drug monitoring 07/05/2013  . Overactive bladder 01/30/2013  . DVT (deep venous thrombosis), left 12/19/2012  . HTN (hypertension) 09/27/2011  . Hearing loss 09/27/2011  . Chest wall pain 09/27/2011  . Seasonal allergies 09/27/2011  . SOB (shortness of breath), related to deconditioning 08/26/2011  . Hypothyroid 08/26/2011   Past Medical History  Diagnosis Date  . Allergy   . Hypertension   . Hypothyroid   . Blood transfusion without reported diagnosis   . Pneumonia   . DVT (deep vein thrombosis) in pregnancy   . Breast cancer ([place]) 07/12/13    invasive mammary carcinoma  . Anxiety   . Arthritis   . Radiation 11/21/13-01/07/14    Right Breast/supraclav.   Past Surgical History  Procedure Laterality Date  . Removal of teeth  04/19/2012    13 teeth removed  . Biospy      female organs  . Dilation and curettage of uterus    . Breast lumpectomy with radioactive seed localization Right 09/09/2013    Procedure: BREAST LUMPECTOMY WITH RADIOACTIVE SEED LOCALIZATION WITH AXILLARY NODE EXCISION;  Surgeon: [name], MD;  Location: [facility];  Service: General;  Laterality: Right;  . Re-excision of breast lumpectomy Right 09/24/2013    Procedure: RE-EXCISION OF RIGHT BREAST LUMPECTOMY;  Surgeon: [name], MD;  Location: [facility];  Service: General;  Laterality: Right;   Allergies  Allergen Reactions  . Anesthetics, Amide Other (See Comments)    ELEVATE BLOOD PRESSURE  . Benadryl [Diphenhydramine Hcl]     dizziness  . Carbocaine [Mepivacaine Hcl] Other (See Comments)    Elevated blood pressure  . Codeine Nausea Only and Other (See Comments)    dizziness  . Epinephrine Other (See Comments)    Elevated blood pressure  . Sulfa Antibiotics Other (See Comments)    dizziness  . Vicodin [Hydrocodone-Acetaminophen] Nausea Only  . Penicillins Rash   Prior to Admission medications   Medication Sig Start Date End Date Taking? Authorizing Provider  acetaminophen (TYLENOL) 500 MG tablet Take 500 mg by mouth every 6 (six) hours as needed for mild pain, fever or headache.   Yes Historical Provider, MD  Cholecalciferol (VITAMIN D3) 5000  UNITS TABS Take by mouth.   Yes Historical Provider, MD  levothyroxine (SYNTHROID) 125 MCG tablet Take 1 tablet (125 mcg total) by mouth daily before breakfast. Dispense as written: Synthroid 02/05/15  Yes Alicia Haber, MD  lisinopril (PRINIVIL,ZESTRIL) 10 MG tablet Take 1 tablet (10 mg total) by mouth daily. 07/14/15  Yes Alicia Haber, MD  montelukast (SINGULAIR) 10 MG tablet Take 1 tablet (10 mg total) by mouth at bedtime. 08/07/14  Yes Alicia Haber, MD  non-metallic deodorant Alicia Vasquez) MISC Apply 1 application topically daily as needed. 11/27/13  Yes Thea Silversmith, MD  oxyCODONE (OXY IR/ROXICODONE) 5 MG immediate release tablet Take 1 tablet (5 mg total) by mouth every 6 (six) hours as needed for severe pain. 04/16/15  Yes Alicia Haber, MD  triamcinolone cream (KENALOG) 0.1 % Apply 1 application topically 2 (two) times daily. 04/16/15  Yes Alicia Haber, MD  warfarin (JANTOVEN) 5 MG tablet TAKE AS DIRECTED BY ANTICOAGULATION CLINIC 07/18/15  Yes Alicia Haber, MD   Social History   Social History  . Marital Status: Widowed    Spouse Name: N/A  . Number of Children: 1  . Years of Education: N/A   Occupational History  . Not on file.   Social History Main Topics  . Smoking status: Never Smoker   . Smokeless tobacco: Never Used  . Alcohol Use: No  . Drug Use: No  . Sexual Activity: No   Other Topics Concern  . Not on file   Social History Narrative   Exercise yard work    Review of Systems  Constitutional: Negative for fever and chills.  Eyes: Negative for pain, redness and itching.  Respiratory: Negative for cough and shortness of breath.   Gastrointestinal: Negative for nausea and vomiting.  Musculoskeletal: Positive for back pain and neck pain.  Skin: Positive for rash.  Neurological: Negative for seizures, syncope and speech difficulty.       Objective:   Physical Exam  Constitutional: She is oriented to person, place, and time. She appears well-developed. No distress.  HENT:  Head: Normocephalic and atraumatic.  Eyes: Pupils are equal, round, and reactive to light.  Cardiovascular: Normal rate.   Pulmonary/Chest: Effort normal and breath sounds normal. No respiratory distress. She has no wheezes. She has no rales.  Neurological: She is alert and oriented to person, place, and time.  Skin:  2 cm ecchymotic area on the medial bicep    Filed Vitals:   08/06/15 1413  BP: 120/68  Pulse: 87  Temp: 97.4 F (36.3 C)  TempSrc: Oral  Resp: 20  Height: 5\' 4"  (1.626 m)  Weight: 217 lb 9.6 oz (98.703 kg)    SpO2: 98%         Assessment & Plan:   This chart was scribed in my presence and reviewed by me personally.    ICD-9-CM ICD-10-CM   1. Cervical disc disorder with radiculopathy of cervical region 723.4 M50.10 methylPREDNISolone (MEDROL) 4 MG tablet     Signed, Alicia Haber, MD

## 2015-08-07 ENCOUNTER — Telehealth: Payer: Self-pay

## 2015-08-07 NOTE — Telephone Encounter (Signed)
Pt thinks she was supposed to get a hydrcodone rx   Best number [phone]

## 2015-08-09 MED ORDER — OXYCODONE HCL 5 MG PO TABS: 5.0000 mg | ORAL_TABLET | Freq: Four times a day (QID) | ORAL | Status: DC | PRN

## 2015-08-09 NOTE — Addendum Note (Signed)
Addended by: [name] on: 08/09/2015 01:21 PM   Modules accepted: Orders

## 2015-08-13 DIAGNOSIS — D2312 Other benign neoplasm of skin of left eyelid, including canthus: Secondary | ICD-10-CM | POA: Diagnosis not present

## 2015-08-13 DIAGNOSIS — H1131 Conjunctival hemorrhage, right eye: Secondary | ICD-10-CM | POA: Diagnosis not present

## 2015-08-19 ENCOUNTER — Ambulatory Visit (INDEPENDENT_AMBULATORY_CARE_PROVIDER_SITE_OTHER): Payer: Medicare Other

## 2015-08-19 ENCOUNTER — Ambulatory Visit (INDEPENDENT_AMBULATORY_CARE_PROVIDER_SITE_OTHER): Payer: Medicare Other | Admitting: Family Medicine

## 2015-08-19 ENCOUNTER — Other Ambulatory Visit: Payer: Self-pay

## 2015-08-19 VITALS — BP 110/76 | HR 88 | Temp 97.9°F | Resp 16 | Ht 64.0 in | Wt 217.2 lb

## 2015-08-19 DIAGNOSIS — M79604 Pain in right leg: Secondary | ICD-10-CM

## 2015-08-19 DIAGNOSIS — M79601 Pain in right arm: Secondary | ICD-10-CM

## 2015-08-19 DIAGNOSIS — C50411 Malignant neoplasm of upper-outer quadrant of right female breast: Secondary | ICD-10-CM

## 2015-08-19 DIAGNOSIS — I82402 Acute embolism and thrombosis of unspecified deep veins of left lower extremity: Secondary | ICD-10-CM

## 2015-08-19 DIAGNOSIS — Z853 Personal history of malignant neoplasm of breast: Secondary | ICD-10-CM

## 2015-08-19 DIAGNOSIS — M79609 Pain in unspecified limb: Secondary | ICD-10-CM | POA: Diagnosis not present

## 2015-08-19 DIAGNOSIS — R0602 Shortness of breath: Secondary | ICD-10-CM | POA: Diagnosis not present

## 2015-08-19 LAB — COMPLETE METABOLIC PANEL WITH GFR
ALT: 26 U/L (ref 6–29)
AST: 20 U/L (ref 10–35)
Albumin: 4.1 g/dL (ref 3.6–5.1)
Alkaline Phosphatase: 75 U/L (ref 33–130)
BUN: 14 mg/dL (ref 7–25)
CO2: 27 mmol/L (ref 20–31)
Calcium: 10.2 mg/dL (ref 8.6–10.4)
Chloride: 105 mmol/L (ref 98–110)
Creat: 0.77 mg/dL (ref 0.60–0.93)
GFR, Est African American: 87 mL/min (ref >=60)
GFR, Est Non African American: 75 mL/min (ref >=60)
Glucose, Bld: 105 mg/dL — ABNORMAL HIGH (ref 65–99)
Potassium: 4.6 mmol/L (ref 3.5–5.3)
Sodium: 141 mmol/L (ref 135–146)
Total Bilirubin: 0.4 mg/dL (ref 0.2–1.2)
Total Protein: 6.5 g/dL (ref 6.1–8.1)

## 2015-08-19 LAB — POCT CBC
Granulocyte percent: 70.4 %G (ref 37–80)
HCT, POC: 41 % (ref 37.7–47.9)
Hemoglobin: 14.2 g/dL (ref 12.2–16.2)
Lymph, poc: 1.2 (ref 0.6–3.4)
MCH, POC: 31.5 pg — AB (ref 27–31.2)
MCHC: 34.7 g/dL (ref 31.8–35.4)
MCV: 90.6 fL (ref 80–97)
MID (cbc): 0.7 (ref 0–0.9)
MPV: 7.4 fL (ref 0–99.8)
POC Granulocyte: 4.4 (ref 2–6.9)
POC LYMPH PERCENT: 19 %L (ref 10–50)
POC MID %: 10.6 %M (ref 0–12)
Platelet Count, POC: 180 10*3/uL (ref 142–424)
RBC: 4.52 M/uL (ref 4.04–5.48)
RDW, POC: 14.2 %
WBC: 6.3 10*3/uL (ref 4.6–10.2)

## 2015-08-19 LAB — POCT SEDIMENTATION RATE: POCT SED RATE: 13 mm/hr (ref 0–22)

## 2015-08-19 MED ORDER — TRIAMTERENE-HCTZ 37.5-25 MG PO TABS: 1.0000 | ORAL_TABLET | Freq: Every day | ORAL | Status: DC

## 2015-08-19 NOTE — Patient Instructions (Addendum)
  Please return in 2 weeks   IF you received an x-ray today, you will receive an invoice from [facility]. Please contact [facility] at [phone] with questions or concerns regarding your invoice.   IF you received labwork today, you will receive an invoice from [employer]. Please contact [facility] at [phone] with questions or concerns regarding your invoice.   Our billing staff will not be able to assist you with questions regarding bills from these companies.  You will be contacted with the lab results as soon as they are available. The fastest way to get your results is to activate your My Chart account. Instructions are located on the last page of this paperwork. If you have not heard from [name] regarding the results in 2 weeks, please contact this office.

## 2015-08-19 NOTE — Progress Notes (Addendum)
   Subjective:    Patient ID: Alicia Vasquez, female    DOB: February 23, 1941, 76 y.o.   MRN: [id number] By signing my name below, I, Zola Button, attest that this documentation has been prepared under the direction and in the presence of [name], MD.  Electronically Signed: Zola Button, Medical Scribe. 08/19/2015. 3:52 PM.  HPI HPI Comments: Alicia Vasquez is a 75 y.o. female with a history of DVT and breast cancer who presents to the [facility] complaining of right arm swelling that started 1 week ago. Patient notes there is a painful knot to her right arm. She also reports having soreness to her right collarbone area. She states she had a small amount of swelling in her feet yesterday. She notes her temperature today, 97.9 F, is elevated compared to her baseline temperature. Her last CT Chest was 6 months ago. Her oncologist is Dr. Jana Hakim; her next appointment is sometime in April.  Review of Systems  Cardiovascular: Positive for leg swelling.  Musculoskeletal: Positive for arthralgias.       Objective:   Physical Exam CONSTITUTIONAL: Well developed/well nourished HEAD: Normocephalic/atraumatic EYES: EOM/PERRL ENMT: Mucous membranes moist NECK: supple no meningeal signs SPINE: entire spine nontender CV: S1/S2 noted, no murmurs/rubs/gallops noted LUNGS: Lungs are clear to auscultation bilaterally, no apparent distress ABDOMEN: soft, nontender, no rebound or guarding GU: no cva tenderness NEURO: Pt is awake/alert, moves all extremitiesx4 EXTREMITIES: pulses normal, full ROM. Right arm is diffusely swollen from her wrist to the armpit. SKIN: warm, color normal PSYCH: no abnormalities of mood noted  Results for orders placed or performed in visit on 08/19/15  POCT CBC  Result Value Ref Range   WBC 6.3 4.6 - 10.2 K/uL   Lymph, poc 1.2 0.6 - 3.4   POC LYMPH PERCENT 19.0 10 - 50 %L   MID (cbc) 0.7 0 - 0.9   POC MID % 10.6 0 - 12 %M   POC Granulocyte 4.4 2 -  6.9   Granulocyte percent 70.4 37 - 80 %G   RBC 4.52 4.04 - 5.48 M/uL   Hemoglobin 14.2 12.2 - 16.2 g/dL   HCT, POC 41.0 37.7 - 47.9 %   MCV 90.6 80 - 97 fL   MCH, POC 31.5 (A) 27 - 31.2 pg   MCHC 34.7 31.8 - 35.4 g/dL   RDW, POC 14.2 %   Platelet Count, POC 180 142 - 424 K/uL   MPV 7.4 0 - 99.8 fL    Chest x-ray: No acute abnormality     Assessment & Plan:   I'm very concerned about this latest development. I will try to contact Dr.Magrinat.  This chart was scribed in my presence and reviewed by me personally.    ICD-9-CM ICD-10-CM   1. Arm pain, diffuse, right 729.5 M79.601 DG Chest 2 View    M79.604 POCT CBC     POCT SEDIMENTATION RATE     COMPLETE METABOLIC PANEL WITH GFR     Ambulatory referral to Oncology     triamterene-hydrochlorothiazide (MAXZIDE-25) 37.5-25 MG tablet  2. Personal history of malignant neoplasm of breast V10.3 Z85.3 POCT CBC     POCT SEDIMENTATION RATE     COMPLETE METABOLIC PANEL WITH GFR     Ambulatory referral to Oncology     triamterene-hydrochlorothiazide (MAXZIDE-25) 37.5-25 MG tablet     Signed, [name], MD

## 2015-08-20 ENCOUNTER — Ambulatory Visit (HOSPITAL_BASED_OUTPATIENT_CLINIC_OR_DEPARTMENT_OTHER): Payer: Medicare Other | Admitting: Nurse Practitioner

## 2015-08-20 ENCOUNTER — Other Ambulatory Visit (HOSPITAL_BASED_OUTPATIENT_CLINIC_OR_DEPARTMENT_OTHER): Payer: Medicare Other

## 2015-08-20 VITALS — BP 160/60 | HR 83 | Temp 97.4°F | Resp 18 | Ht 64.0 in | Wt 216.8 lb

## 2015-08-20 DIAGNOSIS — Z7901 Long term (current) use of anticoagulants: Secondary | ICD-10-CM | POA: Diagnosis not present

## 2015-08-20 DIAGNOSIS — E213 Hyperparathyroidism, unspecified: Secondary | ICD-10-CM

## 2015-08-20 DIAGNOSIS — C50411 Malignant neoplasm of upper-outer quadrant of right female breast: Secondary | ICD-10-CM

## 2015-08-20 DIAGNOSIS — R600 Localized edema: Secondary | ICD-10-CM

## 2015-08-20 DIAGNOSIS — I82402 Acute embolism and thrombosis of unspecified deep veins of left lower extremity: Secondary | ICD-10-CM | POA: Diagnosis not present

## 2015-08-20 DIAGNOSIS — R609 Edema, unspecified: Secondary | ICD-10-CM | POA: Diagnosis not present

## 2015-08-20 LAB — PROTIME-INR
INR: 2.9 (ref 2.00–3.50)
PROTIME: 34.8 s — AB (ref 10.6–13.4)

## 2015-08-21 ENCOUNTER — Telehealth: Payer: Self-pay

## 2015-08-21 ENCOUNTER — Ambulatory Visit (HOSPITAL_COMMUNITY)
Admission: RE | Admit: 2015-08-21 | Discharge: 2015-08-21 | Disposition: A | Discharge status: Home / Self Care | Payer: Medicare Other | Source: Ambulatory Visit | Attending: Nurse Practitioner | Admitting: Nurse Practitioner

## 2015-08-21 ENCOUNTER — Telehealth: Payer: Self-pay | Admitting: Oncology

## 2015-08-21 ENCOUNTER — Telehealth: Payer: Self-pay | Admitting: *Deleted

## 2015-08-21 DIAGNOSIS — R609 Edema, unspecified: Secondary | ICD-10-CM | POA: Insufficient documentation

## 2015-08-21 DIAGNOSIS — R600 Localized edema: Secondary | ICD-10-CM

## 2015-08-21 DIAGNOSIS — C50411 Malignant neoplasm of upper-outer quadrant of right female breast: Secondary | ICD-10-CM

## 2015-08-21 NOTE — Progress Notes (Signed)
VASCULAR LAB PRELIMINARY  PRELIMINARY  PRELIMINARY  PRELIMINARY  Right upper extremity venous duplex completed.    Preliminary report:  Right:  No evidence of DVT or superficial thrombosis.    Ayo Guarino, RVT 08/21/2015, 12:05 PM

## 2015-08-21 NOTE — Telephone Encounter (Signed)
Left vm for lymphedema clinic to sch appt for pt per 3/24 urgent pof

## 2015-08-21 NOTE — Progress Notes (Signed)
Doppler US ordered and scheduled for pt at 1130am Friday 3/24. Pt called to advise appointment time.

## 2015-08-21 NOTE — Telephone Encounter (Signed)
LM for rtn call to discuss doppler.  Doppler was negative for DVT. Pt has been referred to the lymphedema clinic on [facility] They should be calling her for an appt soon. Referral entered as urgent.

## 2015-08-21 NOTE — Telephone Encounter (Signed)
Called pt back that doppler was negative and lymphedema clinic will be calling her. Pt then asked about INR and warfarin dosing. [facility] did not address this. Will forward to Dr [name] nurse.

## 2015-08-21 NOTE — Telephone Encounter (Signed)
Pt called back and lvm.

## 2015-08-21 NOTE — Telephone Encounter (Addendum)
INR- 2.9  Present Coumadin dose- 5 mg alt day with 7.5 mg Patient will continue on the same dose Next lab draw is 09/17/15

## 2015-08-23 ENCOUNTER — Encounter: Payer: Self-pay | Admitting: Nurse Practitioner

## 2015-08-23 ENCOUNTER — Other Ambulatory Visit: Payer: Self-pay | Admitting: Nurse Practitioner

## 2015-08-23 DIAGNOSIS — Z7901 Long term (current) use of anticoagulants: Secondary | ICD-10-CM | POA: Insufficient documentation

## 2015-08-23 DIAGNOSIS — R600 Localized edema: Secondary | ICD-10-CM

## 2015-08-23 DIAGNOSIS — C50411 Malignant neoplasm of upper-outer quadrant of right female breast: Secondary | ICD-10-CM

## 2015-08-23 NOTE — Assessment & Plan Note (Signed)
Patient states that she had some mild to moderate right arm lymphedema following her surgery; it is not noticed any lymphedema for the past year or so.  Patient states that she has now noted some new edema to right arm for the past week or so.  She states that her cat scratched her arm this past weekend as well.  She denies any pain, numbness, tingling to her arm.  Patient also denies any fevers or chills.  Exam today reveals edema to the entire right upper extremity.  Patient has some healing scratches to her right forearm; with no evidence of infection.  There is also no erythema, warmth, tenderness, or red streaks.  Patient observed with full range of motion.  Patient will undergo a Doppler ultrasound of the right upper extremity tomorrow morning to rule out DVT.  If the Doppler ultrasound is negative-most likely would consider that this is lymphedema to the right upper extremity.  There is no evidence of cellulitis on exam.  If Doppler ultrasound is negative; will arrange for a revisit to the lymphedema clinic for further evaluation.  Patient may also need a new compression sleeve; since she is unsure of her old compression sleeve is.

## 2015-08-23 NOTE — Assessment & Plan Note (Signed)
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  INR today was 2.9.  Patient was advised to continue with the dosing of her Coumadin that she was previously taking.

## 2015-08-23 NOTE — Assessment & Plan Note (Signed)
Patient underwent a right breast lumpectomy and radiation treatments in 2015.  She received anastrozole.  Oral therapy for only approximately 2 weeks; but discontinued the anastrozole secondary to arm edema.  Her most recent notes-patient prefers to continue holding any anastrozole at this time.  Patient is scheduled to return for a mammogram on 09/04/2015.  She is scheduled for labs and a follow-up visit on 09/17/2015.

## 2015-08-23 NOTE — Progress Notes (Signed)
SYMPTOM MANAGEMENT CLINIC   HPI: Alicia Vasquez 75 y.o. female diagnosed with breast cancer.  Patient is status post lumpectomy and radiation treatments in the past.  Patient underwent a two-week trial of anastrozole; but made the decision to hold any further therapy.  Currently undergoing observation only.   HPI  Review of Systems  All other systems reviewed and are negative.   Past Medical History  Diagnosis Date  . Allergy   . Hypertension   . Hypothyroid   . Blood transfusion without reported diagnosis   . Pneumonia   . DVT (deep vein thrombosis) in pregnancy   . Breast cancer ([place]) 07/12/13    invasive mammary carcinoma  . Anxiety   . Arthritis   . Radiation 11/21/13-01/07/14    Right Breast/supraclav.    Past Surgical History  Procedure Laterality Date  . Removal of teeth  04/19/2012    13 teeth removed  . Biospy      female organs  . Dilation and curettage of uterus    . Breast lumpectomy with radioactive seed localization Right 09/09/2013    Procedure: BREAST LUMPECTOMY WITH RADIOACTIVE SEED LOCALIZATION WITH AXILLARY NODE EXCISION;  Surgeon: [name], MD;  Location: [place];  Service: General;  Laterality: Right;  . Re-excision of breast lumpectomy Right 09/24/2013    Procedure: RE-EXCISION OF RIGHT BREAST LUMPECTOMY;  Surgeon: [name], MD;  Location: [place];  Service: General;  Laterality: Right;    has SOB (shortness of breath), related to deconditioning; Hypothyroid; HTN (hypertension); Hearing loss; Chest wall pain; Seasonal allergies; DVT (deep venous thrombosis), left; Overactive bladder; Encounter for therapeutic drug monitoring; Breast cancer of upper-outer quadrant of right female breast ([place]); Depression; Vitamin D deficiency; Hyperparathyroidism ([place]); Arm edema; Bilateral knee pain; DVT, lower extremity ([place]); and Long term current use of anticoagulant therapy on her problem list.    is allergic to  anesthetics, amide; benadryl; carbocaine; codeine; epinephrine; sulfa antibiotics; vicodin; and penicillins.    Medication List       This list is accurate as of: 08/20/15 11:59 PM.  Always use your most recent med list.               acetaminophen 500 MG tablet  Commonly known as:  TYLENOL  Take 500 mg by mouth every 6 (six) hours as needed for mild pain, fever or headache.     levothyroxine 125 MCG tablet  Commonly known as:  SYNTHROID  Take 1 tablet (125 mcg total) by mouth daily before breakfast. Dispense as written: Synthroid     lisinopril 10 MG tablet  Commonly known as:  PRINIVIL,ZESTRIL  Take 1 tablet (10 mg total) by mouth daily.     methylPREDNISolone 4 MG tablet  Commonly known as:  MEDROL  Take 1 tablet (4 mg total) by mouth 2 (two) times daily.     montelukast 10 MG tablet  Commonly known as:  SINGULAIR  Take 1 tablet (10 mg total) by mouth at bedtime.     non-metallic deodorant Misc  Commonly known as:  ALRA  Apply 1 application topically daily as needed.     oxyCODONE 5 MG immediate release tablet  Commonly known as:  Oxy IR/ROXICODONE  Take 1 tablet (5 mg total) by mouth every 6 (six) hours as needed for severe pain.     triamcinolone cream 0.1 %  Commonly known as:  KENALOG  Apply 1 application topically 2 (two) times daily.  triamterene-hydrochlorothiazide 37.5-25 MG tablet  Commonly known as:  MAXZIDE-25  Take 1 tablet by mouth daily.     Vitamin D3 5000 units Tabs  Take by mouth.     warfarin 5 MG tablet  Commonly known as:  JANTOVEN  TAKE AS DIRECTED BY ANTICOAGULATION CLINIC         PHYSICAL EXAMINATION  Oncology Vitals 08/20/2015 08/19/2015  Height 163 cm 163 cm  Weight 98.34 kg 98.521 kg  Weight (lbs) 216 lbs 13 oz 217 lbs 3 oz  BMI (kg/m2) 37.21 kg/m2 37.28 kg/m2  Temp 97.4 97.9  Pulse 83 88  Resp 18 16  SpO2 99 95  BSA (m2) 2.11 m2 2.11 m2   BP Readings from Last 2 Encounters:  08/20/15 160/60  08/19/15 110/76     Physical Exam  Constitutional: She is oriented to person, place, and time and well-developed, well-nourished, and in no distress.  HENT:  Head: Normocephalic and atraumatic.  Mouth/Throat: Oropharynx is clear and moist.  Eyes: Conjunctivae and EOM are normal. Pupils are equal, round, and reactive to light. Right eye exhibits no discharge. Left eye exhibits no discharge. No scleral icterus.  Neck: Normal range of motion. Neck supple. No JVD present. No tracheal deviation present. No thyromegaly present.  Cardiovascular: Normal rate, regular rhythm, normal heart sounds and intact distal pulses.   Pulmonary/Chest: Effort normal and breath sounds normal. No respiratory distress. She has no wheezes. She has no rales. She exhibits no tenderness.  Abdominal: Soft. Bowel sounds are normal. She exhibits no distension and no mass. There is no tenderness. There is no rebound and no guarding.  Musculoskeletal: Normal range of motion. She exhibits edema. She exhibits no tenderness.  Lymphadenopathy:    She has no cervical adenopathy.  Neurological: She is alert and oriented to person, place, and time. Gait normal.  Skin: Skin is warm and dry. No rash noted. No erythema. No pallor.  Healing scratches to the right forearm only.  Psychiatric: Affect normal.    LABORATORY DATA:. Appointment on 08/20/2015  Component Date Value Ref Range Status  . Protime 08/20/2015 34.8* 10.6 - 13.4 Seconds Final  . INR 08/20/2015 2.90  2.00 - 3.50 Final   Comment: INR is useful only to assess adequacy of anticoagulation with coumadin when comparing results from different labs. It should not be used to estimate bleeding risk or presence/abscense of coagulopathy in patients not on coumadin. Expected INR ranges for  nontherapeutic patients is 0.88 - 1.12.   Marland Kitchen Lovenox 08/20/2015 No   Final  Office Visit on 08/19/2015  Component Date Value Ref Range Status  . WBC 08/19/2015 6.3  4.6 - 10.2 K/uL Final  . Lymph, poc  08/19/2015 1.2  0.6 - 3.4 Final  . POC LYMPH PERCENT 08/19/2015 19.0  10 - 50 %L Final  . MID (cbc) 08/19/2015 0.7  0 - 0.9 Final  . POC MID % 08/19/2015 10.6  0 - 12 %M Final  . POC Granulocyte 08/19/2015 4.4  2 - 6.9 Final  . Granulocyte percent 08/19/2015 70.4  37 - 80 %G Final  . RBC 08/19/2015 4.52  4.04 - 5.48 M/uL Final  . Hemoglobin 08/19/2015 14.2  12.2 - 16.2 g/dL Final  . HCT, POC 08/19/2015 41.0  37.7 - 47.9 % Final  . MCV 08/19/2015 90.6  80 - 97 fL Final  . MCH, POC 08/19/2015 31.5* 27 - 31.2 pg Final  . MCHC 08/19/2015 34.7  31.8 - 35.4 g/dL Final  . RDW, POC  08/19/2015 14.2   Final  . Platelet Count, POC 08/19/2015 180  142 - 424 K/uL Final  . MPV 08/19/2015 7.4  0 - 99.8 fL Final  . POCT SED RATE 08/19/2015 13  0 - 22 mm/hr Final  . Sodium 08/19/2015 141  135 - 146 mmol/L Final  . Potassium 08/19/2015 4.6  3.5 - 5.3 mmol/L Final  . Chloride 08/19/2015 105  98 - 110 mmol/L Final  . CO2 08/19/2015 27  20 - 31 mmol/L Final  . Glucose, Bld 08/19/2015 105* 65 - 99 mg/dL Final  . BUN 08/19/2015 14  7 - 25 mg/dL Final  . Creat 08/19/2015 0.77  0.60 - 0.93 mg/dL Final  . Total Bilirubin 08/19/2015 0.4  0.2 - 1.2 mg/dL Final  . Alkaline Phosphatase 08/19/2015 75  33 - 130 U/L Final  . AST 08/19/2015 20  10 - 35 U/L Final  . ALT 08/19/2015 26  6 - 29 U/L Final  . Total Protein 08/19/2015 6.5  6.1 - 8.1 g/dL Final  . Albumin 08/19/2015 4.1  3.6 - 5.1 g/dL Final  . Calcium 08/19/2015 10.2  8.6 - 10.4 mg/dL Final  . GFR, Est African American 08/19/2015 87  >=60 mL/min Final  . GFR, Est Non African American 08/19/2015 75  >=60 mL/min Final   Comment:   The estimated GFR is a calculation valid for adults (>=72 years old) that uses the CKD-EPI algorithm to adjust for age and sex. It is   not to be used for children, pregnant women, hospitalized patients,    patients on dialysis, or with rapidly changing kidney function. According to the NKDEP, eGFR >89 is normal, 60-89 shows  mild impairment, 30-59 shows moderate impairment, 15-29 shows severe impairment and <15 is ESRD.        RADIOGRAPHIC STUDIES: No results found.  ASSESSMENT/PLAN:    Breast cancer of upper-outer quadrant of right female breast [facility]) Patient underwent a right breast lumpectomy and radiation treatments in 2015.  She received anastrozole.  Oral therapy for only approximately 2 weeks; but discontinued the anastrozole secondary to arm edema.  Her most recent notes-patient prefers to continue holding any anastrozole at this time.  Patient is scheduled to return for a mammogram on 09/04/2015.  She is scheduled for labs and a follow-up visit on 09/17/2015.  Arm edema Patient states that she had some mild to moderate right arm lymphedema following her surgery; it is not noticed any lymphedema for the past year or so.  Patient states that she has now noted some new edema to right arm for the past week or so.  She states that her cat scratched her arm this past weekend as well.  She denies any pain, numbness, tingling to her arm.  Patient also denies any fevers or chills.  Exam today reveals edema to the entire right upper extremity.  Patient has some healing scratches to her right forearm; with no evidence of infection.  There is also no erythema, warmth, tenderness, or red streaks.  Patient observed with full range of motion.  Patient will undergo a Doppler ultrasound of the right upper extremity tomorrow morning to rule out DVT.  If the Doppler ultrasound is negative-most likely would consider that this is lymphedema to the right upper extremity.  There is no evidence of cellulitis on exam.  If Doppler ultrasound is negative; will arrange for a revisit to the lymphedema clinic for further evaluation.  Patient may also need a new compression sleeve; since she  is unsure of her old compression sleeve is.  Long term current use of anticoagulant therapy Patient has a history of left leg DVT in  the past; and continues to take Coumadin.  INR today was 2.9.  Patient was advised to continue with the dosing of her Coumadin that she was previously taking.  Patient stated understanding of all instructions; and was in agreement with this plan of care. The patient knows to call the clinic with any problems, questions or concerns.   Review/collaboration with Dr. Jana Hakim regarding all aspects of patient's visit today.   Total time spent with patient was 25 minutes;  with greater than 75 percent of that time spent in face to face counseling regarding patient's symptoms,  and coordination of care and follow up.  Disclaimer:This dictation was prepared with Dragon/digital dictation along with [employer]. Any transcriptional errors that result from this process are unintentional.  Drue Second, NP 08/23/2015

## 2015-08-24 NOTE — Telephone Encounter (Signed)
Charting error.

## 2015-08-25 ENCOUNTER — Ambulatory Visit: Payer: Medicare Other | Attending: Nurse Practitioner | Admitting: Physical Therapy

## 2015-08-25 DIAGNOSIS — M79601 Pain in right arm: Secondary | ICD-10-CM

## 2015-08-25 DIAGNOSIS — I890 Lymphedema, not elsewhere classified: Secondary | ICD-10-CM

## 2015-08-25 DIAGNOSIS — M25611 Stiffness of right shoulder, not elsewhere classified: Secondary | ICD-10-CM

## 2015-08-26 ENCOUNTER — Ambulatory Visit: Payer: Medicare Other | Admitting: Physical Therapy

## 2015-08-26 DIAGNOSIS — M79601 Pain in right arm: Secondary | ICD-10-CM | POA: Diagnosis not present

## 2015-08-26 DIAGNOSIS — I890 Lymphedema, not elsewhere classified: Secondary | ICD-10-CM | POA: Diagnosis not present

## 2015-08-26 DIAGNOSIS — M25611 Stiffness of right shoulder, not elsewhere classified: Secondary | ICD-10-CM | POA: Diagnosis not present

## 2015-08-26 NOTE — Therapy (Signed)
[place], [name], [address] Phone: [phone]   Fax:  [phone]  Physical Therapy Evaluation  Patient Details  Name: Alicia Vasquez MRN: [id number] Date of Birth: [birth year] Referring Provider: Drue Second  Encounter Date: 08/25/2015      PT End of Session - 08/26/15 0741    Visit Number 1   Number of Visits 9   Date for PT Re-Evaluation 10/02/15   PT Start Time [id number]   PT Stop Time 1430   PT Time Calculation (min) 35 min   Activity Tolerance Patient tolerated treatment well   Behavior During Therapy [facility] for tasks assessed/performed      Past Medical History  Diagnosis Date  . Allergy   . Hypertension   . Hypothyroid   . Blood transfusion without reported diagnosis   . Pneumonia   . DVT (deep vein thrombosis) in pregnancy   . Breast cancer ([place]) 07/12/13    invasive mammary carcinoma  . Anxiety   . Arthritis   . Radiation 11/21/13-01/07/14    Right Breast/supraclav.    Past Surgical History  Procedure Laterality Date  . Removal of teeth  04/19/2012    13 teeth removed  . Biospy      female organs  . Dilation and curettage of uterus    . Breast lumpectomy with radioactive seed localization Right 09/09/2013    Procedure: BREAST LUMPECTOMY WITH RADIOACTIVE SEED LOCALIZATION WITH AXILLARY NODE EXCISION;  Surgeon: [name], MD;  Location: [place];  Service: General;  Laterality: Right;  . Re-excision of breast lumpectomy Right 09/24/2013    Procedure: RE-EXCISION OF RIGHT BREAST LUMPECTOMY;  Surgeon: [name], MD;  Location: [place];  Service: General;  Laterality: Right;    There were no vitals filed for this visit.  Visit Diagnosis:  Lymphedema, not elsewhere classified - Plan: PT plan of care cert/re-cert  Stiffness of right shoulder, not elsewhere classified - Plan: PT plan of care cert/re-cert  Pain In Right Arm - Plan: PT  plan of care cert/re-cert      Subjective Assessment - 08/26/15 0738    Subjective pt states she has swelling in her arm that goes collar bone into forearm.     Pertinent History breast cancer in 2015 with lumpectomy and radiation.  Multiple complaints of pain in back, legs, neck shoulder and arm. Pt reports she has not followed up with community exercise progra    Patient Stated Goals to get help with pain and swelling    Currently in Pain? Yes   Pain Score 10-Worst pain ever   Pain Location Arm   Pain Orientation Right   Pain Descriptors / Indicators Sharp   Pain Type Acute pain   Pain Radiating Towards up arm toward collar bone    Pain Onset More than a month ago   Pain Frequency Constant   Aggravating Factors  bending wrist, moving elbow, using arm   Pain Relieving Factors tylenol   Effect of Pain on Daily Activities limts acivities             [facility] PT Assessment - 08/26/15 0001    Assessment   Medical Diagnosis breast cancer    Referring Provider Drue Second   Onset Date/Surgical Date 09/09/13   Hand Dominance Right   Prior Therapy yes prolonged episode of PT in 2016 for exercise and pain control    Precautions   Precautions Other (comment)   Precaution Comments  cancer with previous radiation   Restrictions   Weight Bearing Restrictions No   Balance Screen   Has the patient fallen in the past 6 months No   Has the patient had a decrease in activity level because of a fear of falling?  No   Is the patient reluctant to leave their home because of a fear of falling?  No   Home Social worker Private residence   Living Arrangements Non-relatives/Friends   Available Help at Discharge Available PRN/intermittently   Prior Function   Level of Independence Independent   Cognition   Overall Cognitive Status Within Functional Limits for tasks assessed   Observation/Other Assessments   Observations visible pulling of soft tissue at right medial   antecubital fossa mutiple  healing cat scratches at elbow ches tand back  dry skin visible on arms with dark patches at both elbows well healed lumpectomy scar    Other Surveys  --  Lymphedema Life Imapact Scale 41 or 60% impaired    Sensation   Light Touch Not tested   Coordination   Gross Motor Movements are Fluid and Coordinated Not tested  slow but functional    Posture/Postural Control   Posture/Postural Control Postural limitations   Postural Limitations Rounded Shoulders;Forward head   AROM   Right Shoulder Flexion 110 Degrees   Right Shoulder ABduction 85 Degrees   Right Shoulder External Rotation 45 Degrees   Left Shoulder Flexion 150 Degrees   Left Shoulder ABduction 110 Degrees   Left Shoulder External Rotation 80 Degrees   Cervical Flexion 52   painful   Cervical Extension 35   Cervical - Right Side Bend 25  painful   Cervical - Left Side Bend 30  painful    Strength   Overall Strength Due to pain   Palpation   Palpation comment guitar string at medial antecubital fossa  tightness and firmness wih pain to palpation at right upper trap area lumpectomy scar is moveable            LYMPHEDEMA/ONCOLOGY QUESTIONNAIRE - 08/25/15 1411    Type   Cancer Type breast   Right Upper Extremity Lymphedema   15 cm Proximal to Olecranon Process 38.4 cm   10 cm Proximal to Olecranon Process 35.5 cm   Olecranon Process 30 cm   15 cm Proximal to Ulnar Styloid Process 29 cm   10 cm Proximal to Ulnar Styloid Process 26.6 cm   Just Proximal to Ulnar Styloid Process 17.2 cm   Across Hand at [employer] 20 cm   At [place] of 2nd Digit 6.5 cm   Left Upper Extremity Lymphedema   15 cm Proximal to Olecranon Process 35.8 cm   10 cm Proximal to Olecranon Process 33 cm   Olecranon Process 26 cm   15 cm Proximal to Ulnar Styloid Process 25.5 cm   10 cm Proximal to Ulnar Styloid Process 23 cm   Just Proximal to Ulnar Styloid Process 16 cm   Across Hand at [employer] 19 cm   At  [place] of 2nd Digit 6 cm                [facility] Adult PT Treatment/Exercise - 08/26/15 0001    Self-Care   Self-Care Other Self-Care Comments   Other Self-Care Comments  issued mediium Tg soft for pain management of right arm  [facility] Goals - 08/26/15 0749    CC Long Term Goal  #1   Title Patient will report a decrease in pain in right arm by 50% so they can perform daily activities with greater ease   Time 4   Period Weeks   Status New   CC Long Term Goal  #2   Title Pt will have increase in right shoulder abduction to 110 degrees so that she can reach into a cabinet with right arm    Baseline 85 on 2015-09-17   Time 4   Period Weeks   Status New   CC Long Term Goal  #3   Title Pt will have a decrease in Lymphedema Life Impact Scale to 50 % impaired    Baseline 60% impaired    Time 4   Period Weeks   Status New   CC Long Term Goal  #4   Title Pt will verbalize that she can get and use a comprssion sleeve to help manage lymphedema and arm pain    Time 4   Period Weeks   Status New   CC Long Term Goal  #5   Title Pt will have a dcrease in 1 cm of right arm at 10 cm above the ulnar styloid    Baseline 26.6 cm on 08/26/2015   Time 4   Period Weeks   Status New            Plan - 08/26/15 [id number]    Clinical Impression Statement Mrs Blackston returns to PT with mulitiple complaints but will focus on  soft tissue tightness and pulling and lymphedema in in right arm She does not have a compression sleeve    Pt will benefit from skilled therapeutic intervention in order to improve on the following deficits Decreased range of motion;Increased fascial restricitons;Impaired UE functional use;Increased edema;Decreased strength;Postural dysfunction   Rehab Potential Good   Clinical Impairments Affecting Rehab Potential previous radiation, chronic pain limiting her activities    PT Frequency 2x / week   PT Duration 4 weeks   PT  Treatment/Interventions ADLs/Self Care Home Management;Manual lymph drainage;Compression bandaging;Scar mobilization;Passive range of motion;Patient/family education;Taping;Manual techniques;Therapeutic exercise;Therapeutic activities   PT Next Visit Plan assess effect of tg soft lymphatic yoga series, manual lymph drianage with manual techniques to cording. meeks spinal decompression for posture    Consulted and Agree with Plan of Care Patient          G-Codes - 09-17-2015 0754    Functional Assessment Tool Used lymphedema life impact scale    Functional Limitation Self care   Self Care Current Status [id number]) At least 60 percent but less than 80 percent impaired, limited or restricted   Self Care Goal Status [id number]) At least 40 percent but less than 60 percent impaired, limited or restricted       Problem List Patient Active Problem List   Diagnosis Date Noted  . Long term current use of anticoagulant therapy 08/23/2015  . DVT, lower extremity ([place]) 06/18/2015  . Bilateral knee pain 04/03/2015  . Arm edema 08/28/2014  . Vitamin D deficiency 04/23/2014  . Hyperparathyroidism ([place]) 04/23/2014  . Depression 07/18/2013  . Breast cancer of upper-outer quadrant of right female breast ([place]) 07/15/2013  . Encounter for therapeutic drug monitoring 07/05/2013  . Overactive bladder 01/30/2013  . DVT (deep venous thrombosis), left 12/19/2012  . HTN (hypertension) 09/27/2011  . Hearing loss 09/27/2011  . Chest wall pain 09/27/2011  . Seasonal allergies 09/27/2011  .  SOB (shortness of breath), related to deconditioning 08/26/2011  . Hypothyroid 08/26/2011   [name]. Owens Shark, PT   08/26/2015, 8:02 AM  [place], [name], [address] Phone: [phone]   Fax:  [phone]  Name: LILLYNN LUPO MRN: [id number] Date of Birth: 12-27-1940

## 2015-08-26 NOTE — Therapy (Signed)
Alicia Vasquez, [name], [address] Phone: [phone]   Fax:  [phone]  Physical Therapy Treatment  Patient Details  Name: Alicia Vasquez MRN: [id number] Date of Birth: April 05, 1941 Referring Provider: Drue Second  Encounter Date: 08/26/2015      PT End of Session - 08/26/15 1233    Visit Number 2   Number of Visits 9   Date for PT Re-Evaluation 10/02/15   PT Start Time [id number]   PT Stop Time 1100   PT Time Calculation (min) 45 min   Activity Tolerance Patient tolerated treatment well   Behavior During Therapy [facility] for tasks assessed/performed      Past Medical History  Diagnosis Date  . Allergy   . Hypertension   . Hypothyroid   . Blood transfusion without reported diagnosis   . Pneumonia   . DVT (deep vein thrombosis) in pregnancy   . Breast cancer ([place]) 07/12/13    invasive mammary carcinoma  . Anxiety   . Arthritis   . Radiation 11/21/13-01/07/14    Right Breast/supraclav.    Past Surgical History  Procedure Laterality Date  . Removal of teeth  04/19/2012    13 teeth removed  . Biospy      female organs  . Dilation and curettage of uterus    . Breast lumpectomy with radioactive seed localization Right 09/09/2013    Procedure: BREAST LUMPECTOMY WITH RADIOACTIVE SEED LOCALIZATION WITH AXILLARY NODE EXCISION;  Surgeon: [name], MD;  Location: [facility];  Service: General;  Laterality: Right;  . Re-excision of breast lumpectomy Right 09/24/2013    Procedure: RE-EXCISION OF RIGHT BREAST LUMPECTOMY;  Surgeon: [name], MD;  Location: [facility];  Service: General;  Laterality: Right;    There were no vitals filed for this visit.  Visit Diagnosis:  Lymphedema, not elsewhere classified  Stiffness of right shoulder, not elsewhere classified  Pain In Right Arm      Subjective Assessment - 08/26/15 1024    Subjective Pt states tha her arm  felt better yeserday after using the tg soft.    Pertinent History breast cancer in 2015 with lumpectomy and radiation.  Multiple complaints of pain in back, legs, neck shoulder and arm. Pt reports she has not followed up with community exercise progra    Patient Stated Goals to get help with pain and swelling    Currently in Pain? Yes   Pain Score 9    Pain Location Axilla   Pain Orientation Right   Pain Descriptors / Indicators Aching;Sharp   Pain Radiating Towards down arm toward collarbone             [facility] PT Assessment - 08/26/15 0001    Assessment   Medical Diagnosis breast cancer    Referring Provider Drue Second   Onset Date/Surgical Date 09/09/13   Hand Dominance Right   Prior Therapy yes prolonged episode of PT in 2016 for exercise and pain control    Precautions   Precautions Other (comment)   Precaution Comments cancer with previous radiation   Restrictions   Weight Bearing Restrictions No   Balance Screen   Has the patient fallen in the past 6 months No   Has the patient had a decrease in activity level because of a fear of falling?  No   Is the patient reluctant to leave their home because of a fear of falling?  No   Home Environment   Living  Environment Private residence   Living Arrangements Non-relatives/Friends   Available Help at Discharge Available PRN/intermittently   Prior Function   Level of Independence Independent   Cognition   Overall Cognitive Status Within Functional Limits for tasks assessed   Observation/Other Assessments   Observations visible pulling of soft tissue at right medial  antecubital fossa mutiple  healing cat scratches at elbow ches tand back  dry skin visible on arms with dark patches at both elbows well healed lumpectomy scar    Other Surveys  --  Lymphedema Life Imapact Scale 41 or 60% impaired    Sensation   Light Touch Not tested   Coordination   Gross Motor Movements are Fluid and Coordinated Not tested  slow but  functional    Posture/Postural Control   Posture/Postural Control Postural limitations   Postural Limitations Rounded Shoulders;Forward head   AROM   Right Shoulder Flexion 110 Degrees   Right Shoulder ABduction 85 Degrees   Right Shoulder External Rotation 45 Degrees   Left Shoulder Flexion 150 Degrees   Left Shoulder ABduction 110 Degrees   Left Shoulder External Rotation 80 Degrees   Cervical Flexion 52   painful   Cervical Extension 35   Cervical - Right Side Bend 25  painful   Cervical - Left Side Bend 30  painful    Strength   Overall Strength Due to pain   Palpation   Palpation comment guitar string at medial antecubital fossa  tightness and firmness wih pain to palpation at right upper trap area lumpectomy scar is moveable            LYMPHEDEMA/ONCOLOGY QUESTIONNAIRE - 08/25/15 1411    Type   Cancer Type breast   Right Upper Extremity Lymphedema   15 cm Proximal to Olecranon Process 38.4 cm   10 cm Proximal to Olecranon Process 35.5 cm   Olecranon Process 30 cm   15 cm Proximal to Ulnar Styloid Process 29 cm   10 cm Proximal to Ulnar Styloid Process 26.6 cm   Just Proximal to Ulnar Styloid Process 17.2 cm   Across Hand at [employer] 20 cm   At [place] of 2nd Digit 6.5 cm   Left Upper Extremity Lymphedema   15 cm Proximal to Olecranon Process 35.8 cm   10 cm Proximal to Olecranon Process 33 cm   Olecranon Process 26 cm   15 cm Proximal to Ulnar Styloid Process 25.5 cm   10 cm Proximal to Ulnar Styloid Process 23 cm   Just Proximal to Ulnar Styloid Process 16 cm   Across Hand at [employer] 19 cm   At [place] of 2nd Digit 6 cm                  [facility] Adult PT Treatment/Exercise - 08/26/15 0001    Self-Care   Self-Care Other Self-Care Comments   Other Self-Care Comments  issued mediium Tg soft for pain management of right arm      Treatment today began with neck range of motion in sitting for a warm up.  Pt had difficulty with shoulder  rolls Then to supine for manual lymph drianage; short neck,superficial and deep abdominals thought patient has difficulty With diphragmatic breathing, left axillay nodes and anterior ineraxillary anastamosis, right shoulder to hand with  Extra attention to cording at medial elbow and return along pathways, then soft tissue work with Biotone to upper  Trap area and lateral cervical area on right side.  Pt ended  treatment with dowel rod flexion in supine x 10 repetitions.           [facility] Goals - 08/26/15 0749    CC Long Term Goal  #1   Title Patient will report a decrease in pain in right arm by 50% so they can perform daily activities with greater ease   Time 4   Period Weeks   Status New   CC Long Term Goal  #2   Title Pt will have increase in right shoulder abduction to 110 degrees so that she can reach into a cabinet with right arm    Baseline 85 on Aug 29, 2015   Time 4   Period Weeks   Status New   CC Long Term Goal  #3   Title Pt will have a decrease in Lymphedema Life Impact Scale to 50 % impaired    Baseline 60% impaired    Time 4   Period Weeks   Status New   CC Long Term Goal  #4   Title Pt will verbalize that she can get and use a comprssion sleeve to help manage lymphedema and arm pain    Time 4   Period Weeks   Status New   CC Long Term Goal  #5   Title Pt will have a dcrease in 1 cm of right arm at 10 cm above the ulnar styloid    Baseline 26.6 cm on 08/26/2015   Time 4   Period Weeks   Status New            Plan - 08/26/15 1234    Clinical Impression Statement pt reports some relief with use of tg soft yesterday, but she does not come in with it on today.  She reports she feels better afer today's treatment    Pt will benefit from skilled therapeutic intervention in order to improve on the following deficits Decreased range of motion;Increased fascial restricitons;Impaired UE functional use;Increased edema;Decreased strength;Postural dysfunction    Rehab Potential Good   Clinical Impairments Affecting Rehab Potential previous radiation, chronic pain limiting her activities    PT Frequency 2x / week   PT Duration 4 weeks   PT Next Visit Plan assess effect of tg soft lymphatic yoga series, manual lymph drianage with manual techniques to cording. and soft tissue mobilizaion to upper back  meeks spinal decompression for posture    Consulted and Agree with Plan of Care Patient          G-Codes - 2015/08/29 0754    Functional Assessment Tool Used lymphedema life impact scale    Functional Limitation Self care   Self Care Current Status [id number]) At least 60 percent but less than 80 percent impaired, limited or restricted   Self Care Goal Status [id number]) At least 40 percent but less than 60 percent impaired, limited or restricted      Problem List Patient Active Problem List   Diagnosis Date Noted  . Long term current use of anticoagulant therapy 08/23/2015  . DVT, lower extremity ([place]) 06/18/2015  . Bilateral knee pain 04/03/2015  . Arm edema 08/28/2014  . Vitamin D deficiency 04/23/2014  . Hyperparathyroidism ([place]) 04/23/2014  . Depression 07/18/2013  . Breast cancer of upper-outer quadrant of right female breast ([place]) 07/15/2013  . Encounter for therapeutic drug monitoring 07/05/2013  . Overactive bladder 01/30/2013  . DVT (deep venous thrombosis), left 12/19/2012  . HTN (hypertension) 09/27/2011  . Hearing loss 09/27/2011  . Chest wall pain 09/27/2011  .  Seasonal allergies 09/27/2011  . SOB (shortness of breath), related to deconditioning 08/26/2011  . Hypothyroid 08/26/2011   [name]. Owens Shark, PT    08/26/2015, 12:37 PM  [place], [name], [address] Phone: [phone]   Fax:  [phone]  Name: TALITHIA ERCOLE MRN: [id number] Date of Birth: 1940/07/07

## 2015-08-29 ENCOUNTER — Encounter: Payer: Self-pay | Admitting: *Deleted

## 2015-08-29 ENCOUNTER — Ambulatory Visit (INDEPENDENT_AMBULATORY_CARE_PROVIDER_SITE_OTHER): Payer: Medicare Other | Admitting: Family Medicine

## 2015-08-29 VITALS — BP 119/70 | HR 95 | Temp 97.7°F | Resp 20 | Ht 64.0 in | Wt 215.0 lb

## 2015-08-29 DIAGNOSIS — H6523 Chronic serous otitis media, bilateral: Secondary | ICD-10-CM | POA: Diagnosis not present

## 2015-08-29 MED ORDER — AZITHROMYCIN 250 MG PO TABS: ORAL_TABLET | ORAL | Status: DC

## 2015-08-29 NOTE — Addendum Note (Signed)
Addended by: [name] on: 08/29/2015 04:56 PM   Modules accepted: Miquel Dunn

## 2015-08-29 NOTE — Patient Instructions (Signed)
     IF you received an x-ray today, you will receive an invoice from [facility]. Please contact  Radiology at [phone] with questions or concerns regarding your invoice.   IF you received labwork today, you will receive an invoice from [facility]. Please contact [facility] at [phone] with questions or concerns regarding your invoice.   Our billing staff will not be able to assist you with questions regarding bills from these companies.  You will be contacted with the lab results as soon as they are available. The fastest way to get your results is to activate your My Chart account. Instructions are located on the last page of this paperwork. If you have not heard from us regarding the results in 2 weeks, please contact this office.      

## 2015-08-29 NOTE — Progress Notes (Addendum)
75 yo woman with h/o right breast cancer and two persistent problems.  1. Swelling upper right chest and upper right arm.  She was seen by the cancer clinic and told she might have a clot, but no clot was seen on the duplex scan.  Pain in right neck and clavicle worse when looking left.  She is getting therapy for lymphedema.  2. Persistent sore throat and low grade temperature for a week.  She also has severe fatigue and dyspnea on exertion.  This continues to be a problem.  She uses a can all of the time.  Also complains of headache for over a week.  Objective:  BP 119/70 mmHg  Pulse 95  Temp(Src) 97.7 F (36.5 C) (Oral)  Resp 20  Ht 5\' 4"  (1.626 m)  Wt 215 lb (97.523 kg)  BMI 36.89 kg/m2  SpO2 96% Results for orders placed or performed in visit on 08/20/15  Protime-INR  Result Value Ref Range   Protime 34.8 (H) 10.6 - 13.4 Seconds   INR 2.90 2.00 - 3.50   Lovenox No    Oroph:  Mild gingivitis with missing teeth and the remaining have major gum recession. TM's:  Retracted right TM, no erythema Neck:  No definite adenopathy, there is mild edema over lateral clavicle. Right arm: mild lymphedema of upper right arm, no edema of hand. Gait:  Favors right leg when walking in room.  Gait seems stable without cane for the most part Skin:  No rash  Bilateral chronic serous otitis media - Plan: azithromycin (ZITHROMAX) 250 MG tablet Follow-up one week [name], MD   Note: Gait stability was evaluated during this visit. Patient does not want to have a colonoscopy at this time because of her other problems

## 2015-09-02 ENCOUNTER — Ambulatory Visit: Payer: Medicare Other | Attending: Nurse Practitioner

## 2015-09-02 DIAGNOSIS — M25611 Stiffness of right shoulder, not elsewhere classified: Secondary | ICD-10-CM

## 2015-09-02 DIAGNOSIS — M79601 Pain in right arm: Secondary | ICD-10-CM | POA: Diagnosis not present

## 2015-09-02 DIAGNOSIS — I890 Lymphedema, not elsewhere classified: Secondary | ICD-10-CM | POA: Insufficient documentation

## 2015-09-02 NOTE — Therapy (Signed)
[place], [name], [address] Phone: [phone]   Fax:  [phone]  Physical Therapy Treatment  Patient Details  Name: Alicia Vasquez MRN: [id number] Date of Birth: 1941/04/12 Referring Provider: Drue Second  Encounter Date: 09/02/2015      PT End of Session - 09/02/15 1153    Visit Number 3   Number of Visits 9   Date for PT Re-Evaluation 10/02/15   PT Start Time 1110   PT Stop Time 1157   PT Time Calculation (min) 47 min   Activity Tolerance Patient tolerated treatment well   Behavior During Therapy [facility] for tasks assessed/performed      Past Medical History  Diagnosis Date  . Allergy   . Hypertension   . Hypothyroid   . Blood transfusion without reported diagnosis   . Pneumonia   . DVT (deep vein thrombosis) in pregnancy   . Breast cancer ([place]) 07/12/13    invasive mammary carcinoma  . Anxiety   . Arthritis   . Radiation 11/21/13-01/07/14    Right Breast/supraclav.    Past Surgical History  Procedure Laterality Date  . Removal of teeth  04/19/2012    13 teeth removed  . Biospy      female organs  . Dilation and curettage of uterus    . Breast lumpectomy with radioactive seed localization Right 09/09/2013    Procedure: BREAST LUMPECTOMY WITH RADIOACTIVE SEED LOCALIZATION WITH AXILLARY NODE EXCISION;  Surgeon: [name], MD;  Location: [facility];  Service: General;  Laterality: Right;  . Re-excision of breast lumpectomy Right 09/24/2013    Procedure: RE-EXCISION OF RIGHT BREAST LUMPECTOMY;  Surgeon: [name], MD;  Location: [facility];  Service: General;  Laterality: Right;    There were no vitals filed for this visit.  Visit Diagnosis:  Lymphedema, not elsewhere classified  Stiffness of right shoulder, not elsewhere classified  Pain In Right Arm      Subjective Assessment - 09/02/15 1113    Subjective Been wearing the sleeve  she gave me during the day but the swelling comes right back when I take it off.    Pertinent History breast cancer in 2015 with lumpectomy and radiation.  Multiple complaints of pain in back, legs, neck shoulder and arm. Pt reports she has not followed up with community exercise progra    Patient Stated Goals to get help with pain and swelling    Currently in Pain? Yes   Pain Score 8    Pain Location Axilla   Pain Orientation Right   Pain Descriptors / Indicators Aching;Sharp   Pain Type Acute pain   Pain Radiating Towards down arm and towards collar bone   Pain Onset More than a month ago   Pain Frequency Constant   Aggravating Factors  overusing arm    Pain Relieving Factors tylenol, wearing the sleeve has been helping too when my arm is smaller                         [facility] Adult PT Treatment/Exercise - 09/02/15 0001    Lumbar Exercises: Supine   Other Supine Lumbar Exercises Meeks Decompression Exercises per handout issued 5 reps, 5 seconds each. Verbal cuing throughout for proper technique to only use muscle group (pt was tihgtening neck muscles with leg exs)   Manual Therapy   Manual Lymphatic Drainage (MLD) In Supine: Short neck, superficial and deep abdominals, Lt  axillary nodes, anterior inter-axillary anastomosis, and Rt UE from dorsal hand to lateral shoulder.                 PT Education - 09/02/15 1153    Education provided Yes   Education Details Meeks Decompression Exercises   Person(s) Educated Patient   Methods Explanation;Demonstration;Handout   Comprehension Verbalized understanding;Returned demonstration;Need further instruction                [facility] Goals - 09/02/15 1206    CC Long Term Goal  #1   Title Patient will report a decrease in pain in right arm by 50% so they can perform daily activities with greater ease   Status On-going   CC Long Term Goal  #2   Title Pt will have increase in right shoulder abduction  to 110 degrees so that she can reach into a cabinet with right arm    Status On-going   CC Long Term Goal  #3   Title Pt will have a decrease in Lymphedema Life Impact Scale to 50 % impaired    Status On-going   CC Long Term Goal  #4   Title Pt will verbalize that she can get and use a comprssion sleeve to help manage lymphedema and arm pain    Status On-going   CC Long Term Goal  #5   Title Pt will have a dcrease in 1 cm of right arm at 10 cm above the ulnar styloid    Status On-going            Plan - 09/02/15 1201    Clinical Impression Statement Pt reports TG soft continues to feel beneficial with her arm pain, but not much change with the Rt chest pain. Has a mammoagram (next week?) to try to see what is causing that. Tolerated manual therapy well today. Issued Meeks Decompression Exercises for HEP and pt did well with these with minimal c/o LBP with leg press and "some" neck pain with shoulder presses.    Pt will benefit from skilled therapeutic intervention in order to improve on the following deficits Decreased range of motion;Increased fascial restricitons;Impaired UE functional use;Increased edema;Decreased strength;Postural dysfunction   Rehab Potential Good   Clinical Impairments Affecting Rehab Potential previous radiation, chronic pain limiting her activities    PT Frequency 2x / week   PT Duration 4 weeks   PT Treatment/Interventions ADLs/Self Care Home Management;Manual lymph drainage;Compression bandaging;Scar mobilization;Passive range of motion;Patient/family education;Taping;Manual techniques;Therapeutic exercise;Therapeutic activities   PT Next Visit Plan Assess goals next visit. assess lymphatic yoga series, manual lymph drianage with manual techniques to cording. and soft tissue mobilizaion to upper back  Review meeks spinal decompression for posture    PT Home Exercise Plan Meeks spinal decompression   Consulted and Agree with Plan of Care Patient         Problem List Patient Active Problem List   Diagnosis Date Noted  . Long term current use of anticoagulant therapy 08/23/2015  . DVT, lower extremity ([place]) 06/18/2015  . Bilateral knee pain 04/03/2015  . Arm edema 08/28/2014  . Vitamin D deficiency 04/23/2014  . Hyperparathyroidism ([place]) 04/23/2014  . Depression 07/18/2013  . Breast cancer of upper-outer quadrant of right female breast ([place]) 07/15/2013  . Encounter for therapeutic drug monitoring 07/05/2013  . Overactive bladder 01/30/2013  . DVT (deep venous thrombosis), left 12/19/2012  . HTN (hypertension) 09/27/2011  . Hearing loss 09/27/2011  . Chest wall pain 09/27/2011  .  Seasonal allergies 09/27/2011  . SOB (shortness of breath), related to deconditioning 08/26/2011  . Hypothyroid 08/26/2011    Otelia Limes, PTA 09/02/2015, 12:07 PM  [place], [name], [address] Phone: [phone]   Fax:  [phone]  Name: Alicia Vasquez MRN: [id number] Date of Birth: 07-30-1940

## 2015-09-04 ENCOUNTER — Ambulatory Visit
Admission: RE | Admit: 2015-09-04 | Discharge: 2015-09-04 | Disposition: A | Discharge status: Home / Self Care | Payer: Medicare Other | Source: Ambulatory Visit | Attending: Nurse Practitioner | Admitting: Nurse Practitioner

## 2015-09-04 DIAGNOSIS — Z9889 Other specified postprocedural states: Secondary | ICD-10-CM

## 2015-09-04 DIAGNOSIS — Z853 Personal history of malignant neoplasm of breast: Secondary | ICD-10-CM

## 2015-09-04 DIAGNOSIS — R922 Inconclusive mammogram: Secondary | ICD-10-CM | POA: Diagnosis not present

## 2015-09-05 ENCOUNTER — Ambulatory Visit (INDEPENDENT_AMBULATORY_CARE_PROVIDER_SITE_OTHER): Payer: Medicare Other | Admitting: Family Medicine

## 2015-09-05 VITALS — HR 89 | Temp 97.4°F | Resp 16 | Ht 64.0 in | Wt 215.0 lb

## 2015-09-05 DIAGNOSIS — H6523 Chronic serous otitis media, bilateral: Secondary | ICD-10-CM | POA: Diagnosis not present

## 2015-09-05 DIAGNOSIS — I890 Lymphedema, not elsewhere classified: Secondary | ICD-10-CM | POA: Diagnosis not present

## 2015-09-05 MED ORDER — CETIRIZINE HCL 10 MG PO TABS: 10.0000 mg | ORAL_TABLET | Freq: Every day | ORAL | Status: DC

## 2015-09-05 MED ORDER — AZITHROMYCIN 250 MG PO TABS: ORAL_TABLET | ORAL | Status: DC

## 2015-09-05 NOTE — Progress Notes (Signed)
    75 yo woman who is post radiation therapy for right breast cancer following mastectomy.  She is also having chronic left ear fullness.  She is aware of having very poor dentition.  She also notes right arm lymphedema and is going to the lymphedema clinic  Objective:  NAD Pulse 89  Temp(Src) 97.4 F (36.3 C) (Oral)  Resp 16  Ht 5\' 4"  (1.626 m)  Wt 215 lb (97.523 kg)  BMI 36.89 kg/m2  SpO2 97% Right arm from axilla to mid forearm shows lymphedema.  There is no axillary adenopathy Left ear looks normal Oroph:  No acute infection seen  CLINICAL DATA: History of right breast cancer status post lumpectomy 2015. Annual examination.  EXAM: 2D DIGITAL DIAGNOSTIC BILATERAL MAMMOGRAM WITH CAD AND ADJUNCT TOMO  COMPARISON: Previous exam(s).  ACR Breast Density Category c: The breast tissue is heterogeneously dense, which may obscure small masses.  FINDINGS: There are lumpectomy changes in the deep upper outer right breast. No mass, nonsurgical distortion, or suspicious microcalcification is identified in either breast to suggest malignancy.  Mammographic images were processed with CAD.  IMPRESSION: No evidence of malignancy in either breast. Lumpectomy changes on the right.  RECOMMENDATION: Diagnostic mammogram is suggested in 1 year. (Code:DM-B-01Y)  I have discussed the findings and recommendations with the patient. Results were also provided in writing at the conclusion of the visit. If applicable, a reminder letter will be sent to the patient regarding the next appointment.  BI-RADS CATEGORY 2: Benign.   Electronically Signed  By: Curlene Dolphin M.D.  On: 09/04/2015 16:43  Assessment:  Bilateral chronic serous otitis media - Plan: azithromycin (ZITHROMAX) 250 MG tablet, cetirizine (ZYRTEC) 10 MG tablet  Lymphedema  [name], MD

## 2015-09-05 NOTE — Patient Instructions (Addendum)
CLINICAL DATA: History of right breast cancer status post lumpectomy 2015. Annual examination.  EXAM: 2D DIGITAL DIAGNOSTIC BILATERAL MAMMOGRAM WITH CAD AND ADJUNCT TOMO  COMPARISON: Previous exam(s).  ACR Breast Density Category c: The breast tissue is heterogeneously dense, which may obscure small masses.  FINDINGS: There are lumpectomy changes in the deep upper outer right breast. No mass, nonsurgical distortion, or suspicious microcalcification is identified in either breast to suggest malignancy.  Mammographic images were processed with CAD.  IMPRESSION: No evidence of malignancy in either breast. Lumpectomy changes on the right.  RECOMMENDATION: Diagnostic mammogram is suggested in 1 year. (Code:DM-B-01Y)  I have discussed the findings and recommendations with the patient. Results were also provided in writing at the conclusion of the visit. If applicable, a reminder letter will be sent to the patient regarding the next appointment.  BI-RADS CATEGORY 2: Benign.   Electronically Signed  By: Curlene Dolphin M.D.  On: 09/04/2015 16:43  Please let me know if you are not improving by Wednesday.

## 2015-09-08 ENCOUNTER — Ambulatory Visit: Payer: Medicare Other

## 2015-09-08 DIAGNOSIS — M25611 Stiffness of right shoulder, not elsewhere classified: Secondary | ICD-10-CM | POA: Diagnosis not present

## 2015-09-08 DIAGNOSIS — I890 Lymphedema, not elsewhere classified: Secondary | ICD-10-CM | POA: Diagnosis not present

## 2015-09-08 DIAGNOSIS — M79601 Pain in right arm: Secondary | ICD-10-CM

## 2015-09-08 NOTE — Therapy (Signed)
[place], [name], [address] Phone: [phone]   Fax:  [phone]  Physical Therapy Treatment  Patient Details  Name: Alicia Vasquez MRN: [id number] Date of Birth: June 30, 1940 Referring Provider: Drue Second  Encounter Date: 09/08/2015      PT End of Session - 09/08/15 1156    Visit Number 4   Number of Visits 9   Date for PT Re-Evaluation 10/02/15   Authorization Type April cert routed   PT Start Time 1111   PT Stop Time 1152   PT Time Calculation (min) 41 min   Activity Tolerance Patient tolerated treatment well   Behavior During Therapy [facility] for tasks assessed/performed      Past Medical History  Diagnosis Date  . Allergy   . Hypertension   . Hypothyroid   . Blood transfusion without reported diagnosis   . Pneumonia   . DVT (deep vein thrombosis) in pregnancy   . Breast cancer ([place]) 07/12/13    invasive mammary carcinoma  . Anxiety   . Arthritis   . Radiation 11/21/13-01/07/14    Right Breast/supraclav.    Past Surgical History  Procedure Laterality Date  . Removal of teeth  04/19/2012    13 teeth removed  . Biospy      female organs  . Dilation and curettage of uterus    . Breast lumpectomy with radioactive seed localization Right 09/09/2013    Procedure: BREAST LUMPECTOMY WITH RADIOACTIVE SEED LOCALIZATION WITH AXILLARY NODE EXCISION;  Surgeon: [name], MD;  Location: [place];  Service: General;  Laterality: Right;  . Re-excision of breast lumpectomy Right 09/24/2013    Procedure: RE-EXCISION OF RIGHT BREAST LUMPECTOMY;  Surgeon: [name], MD;  Location: [place];  Service: General;  Laterality: Right;    There were no vitals filed for this visit.      Subjective Assessment - 09/08/15 1133    Subjective I had an ear infection so I was given Azithromyacin and started that Saturday. My Rt arm feels about the same.    Pertinent History breast cancer in 2015 with lumpectomy and radiation.  Multiple complaints of pain in back, legs, neck shoulder and arm. Pt reports she has not followed up with community exercise progra    Patient Stated Goals to get help with pain and swelling    Currently in Pain? Yes   Pain Score 8    Pain Location Axilla   Pain Orientation Right   Pain Descriptors / Indicators Aching;Sharp   Pain Type Acute pain   Pain Onset More than a month ago   Pain Frequency Constant   Aggravating Factors  overusing arm   Pain Relieving Factors tylenol, wearing the soft sleeve has been helping                         [facility] Adult PT Treatment/Exercise - 09/08/15 0001    Shoulder Exercises: Supine   Horizontal ABduction AROM;Both;10 reps   Other Supine Exercises Scaption into a "V" 10 reps bil UE's   Shoulder Exercises: Pulleys   Flexion 2 minutes   ABduction 2 minutes   Shoulder Exercises: Therapy Ball   Flexion 20 reps   Manual Therapy   Manual Lymphatic Drainage (MLD) In Supine: Short neck, superficial and deep abdominals, Lt axillary nodes, anterior inter-axillary anastomosis, and Rt UE from dorsal hand to lateral shoulder.  [facility] Goals - 09/02/15 1206    CC Long Term Goal  #1   Title Patient will report a decrease in pain in right arm by 50% so they can perform daily activities with greater ease   Status On-going   CC Long Term Goal  #2   Title Pt will have increase in right shoulder abduction to 110 degrees so that she can reach into a cabinet with right arm    Status On-going   CC Long Term Goal  #3   Title Pt will have a decrease in Lymphedema Life Impact Scale to 50 % impaired    Status On-going   CC Long Term Goal  #4   Title Pt will verbalize that she can get and use a comprssion sleeve to help manage lymphedema and arm pain    Status On-going   CC Long Term Goal  #5   Title Pt will have a dcrease in 1 cm  of right arm at 10 cm above the ulnar styloid    Status On-going            Plan - 09/08/15 1200    Clinical Impression Statement Pt reports she has been using her Rt UE more but still experiences Rt axilla soreness and forearm/hand tingling. Her mammogram went well/negative results, but did have some increased UE swelling after. She c/o increased axilla soreness and forearm tingling with AAROM stetches today.    Rehab Potential Good   Clinical Impairments Affecting Rehab Potential previous radiation, chronic pain limiting her activities    PT Frequency 2x / week   PT Duration 4 weeks   PT Treatment/Interventions ADLs/Self Care Home Management;Manual lymph drainage;Compression bandaging;Scar mobilization;Passive range of motion;Patient/family education;Taping;Manual techniques;Therapeutic exercise;Therapeutic activities   PT Next Visit Plan Assess goals next visit. assess lymphatic yoga series, manual lymph drianage with manual techniques to cording. and soft tissue mobilizaion to upper back  Review meeks spinal decompression for posture    Consulted and Agree with Plan of Care Patient      Patient will benefit from skilled therapeutic intervention in order to improve the following deficits and impairments:  Decreased range of motion, Increased fascial restricitons, Impaired UE functional use, Increased edema, Decreased strength, Postural dysfunction  Visit Diagnosis: Lymphedema, not elsewhere classified  Stiffness of right shoulder, not elsewhere classified  Pain In Right Arm     Problem List Patient Active Problem List   Diagnosis Date Noted  . Long term current use of anticoagulant therapy 08/23/2015  . DVT, lower extremity ([place]) 06/18/2015  . Bilateral knee pain 04/03/2015  . Arm edema 08/28/2014  . Vitamin D deficiency 04/23/2014  . Hyperparathyroidism ([place]) 04/23/2014  . Depression 07/18/2013  . Breast cancer of upper-outer quadrant of right female breast ([place])  07/15/2013  . Encounter for therapeutic drug monitoring 07/05/2013  . Overactive bladder 01/30/2013  . DVT (deep venous thrombosis), left 12/19/2012  . HTN (hypertension) 09/27/2011  . Hearing loss 09/27/2011  . Chest wall pain 09/27/2011  . Seasonal allergies 09/27/2011  . SOB (shortness of breath), related to deconditioning 08/26/2011  . Hypothyroid 08/26/2011    Otelia Limes, PTA 09/08/2015, 12:20 PM  [place], [name], [address] Phone: [phone]   Fax:  [phone]  Name: SHIRLETTA SARSFIELD MRN: [id number] Date of Birth: 01-30-1941

## 2015-09-09 NOTE — Addendum Note (Signed)
Addended by: Kipp Laurence on: 09/09/2015 01:49 PM   Modules accepted: Orders

## 2015-09-10 ENCOUNTER — Ambulatory Visit: Payer: Medicare Other | Admitting: Physical Therapy

## 2015-09-10 DIAGNOSIS — I890 Lymphedema, not elsewhere classified: Secondary | ICD-10-CM | POA: Diagnosis not present

## 2015-09-10 DIAGNOSIS — M79601 Pain in right arm: Secondary | ICD-10-CM | POA: Diagnosis not present

## 2015-09-10 DIAGNOSIS — M25611 Stiffness of right shoulder, not elsewhere classified: Secondary | ICD-10-CM | POA: Diagnosis not present

## 2015-09-10 NOTE — Therapy (Signed)
[place], [name], [address] Phone: [phone]   Fax:  [phone]  Physical Therapy Treatment  Patient Details  Name: Alicia Vasquez MRN: [id number] Date of Birth: 14-Nov-1940 Referring Provider: Drue Second  Encounter Date: 09/10/2015      PT End of Session - 09/10/15 1429    Visit Number 5   Number of Visits 9   Date for PT Re-Evaluation 10/02/15   Authorization Type April cert done   PT Start Time 1355  pt arrived late to appointment   PT Stop Time 1430   PT Time Calculation (min) 35 min   Activity Tolerance Patient tolerated treatment well   Behavior During Therapy [facility] for tasks assessed/performed      Past Medical History  Diagnosis Date  . Allergy   . Hypertension   . Hypothyroid   . Blood transfusion without reported diagnosis   . Pneumonia   . DVT (deep vein thrombosis) in pregnancy   . Breast cancer ([place]) 07/12/13    invasive mammary carcinoma  . Anxiety   . Arthritis   . Radiation 11/21/13-01/07/14    Right Breast/supraclav.    Past Surgical History  Procedure Laterality Date  . Removal of teeth  04/19/2012    13 teeth removed  . Biospy      female organs  . Dilation and curettage of uterus    . Breast lumpectomy with radioactive seed localization Right 09/09/2013    Procedure: BREAST LUMPECTOMY WITH RADIOACTIVE SEED LOCALIZATION WITH AXILLARY NODE EXCISION;  Surgeon: [name], MD;  Location: [place];  Service: General;  Laterality: Right;  . Re-excision of breast lumpectomy Right 09/24/2013    Procedure: RE-EXCISION OF RIGHT BREAST LUMPECTOMY;  Surgeon: [name], MD;  Location: [place];  Service: General;  Laterality: Right;    There were no vitals filed for this visit.      Subjective Assessment - 09/10/15 1357    Subjective Pt a little late today. "I'm hurting"    Pertinent History breast cancer in 2015 with  lumpectomy and radiation.  Multiple complaints of pain in back, legs, neck shoulder and arm. Pt reports she has not followed up with community exercise progra    Patient Stated Goals to get help with pain and swelling    Pain Score 7    Pain Location Arm   Pain Orientation Right   Pain Radiating Towards down arm and up to neck    Pain Onset More than a month ago   Pain Relieving Factors tylenol, wearing the soft sleeve.                LYMPHEDEMA/ONCOLOGY QUESTIONNAIRE - 09/10/15 1400    Right Upper Extremity Lymphedema   15 cm Proximal to Olecranon Process 37.3 cm   10 cm Proximal to Olecranon Process 34.7 cm   Olecranon Process 28.9 cm   15 cm Proximal to Ulnar Styloid Process 27 cm   10 cm Proximal to Ulnar Styloid Process 25.4 cm   Just Proximal to Ulnar Styloid Process 17 cm   Across Hand at [employer] 19 cm   At [place] of 2nd Digit 6 cm                  [facility] Adult PT Treatment/Exercise - 09/10/15 0001    Neck Exercises: Seated   Other Seated Exercise neck range of motion , pt says she able to do them at home  Shoulder Exercises: Supine   Other Supine Exercises dowel rod for shoulder flexion    Manual Therapy   Manual Lymphatic Drainage (MLD) In Supine: Short neck, superficial and deep abdominals, Lt axillary nodes, anterior inter-axillary anastomosis, and Rt UE from dorsal hand to lateral shoulder.                         [facility] Goals - 09/10/15 1716    CC Long Term Goal  #1   Title Patient will report a decrease in pain in right arm by 50% so they can perform daily activities with greater ease   Status On-going   CC Long Term Goal  #2   Title Pt will have increase in right shoulder abduction to 110 degrees so that she can reach into a cabinet with right arm    Status On-going   CC Long Term Goal  #3   Title Pt will have a decrease in Lymphedema Life Impact Scale to 50 % impaired    Status On-going   CC Long Term Goal   #4   Title Pt will verbalize that she can get and use a comprssion sleeve to help manage lymphedema and arm pain    Status On-going   CC Long Term Goal  #5   Title Pt will have a dcrease in 1 cm of right arm at 10 cm above the ulnar styloid    Baseline 26.6 cm on 08/26/2015  25.4 on 09/10/2015    Status Achieved            Plan - 09/10/15 1712    Clinical Impression Statement Pt had some nice reducitions upon circumferential measurement today, though she still complains of pain.  She did well with neck ROM and dowel rod exercise and says that she is doing them at home so those goals are met. Feel she will be ready for sleeve measurement at  [facility] in the next few  weeks.    Rehab Potential Good   Clinical Impairments Affecting Rehab Potential previous radiation, chronic pain limiting her activities    PT Next Visit Plan  Review meeks spinal decompression for posture  give pt a prescription request for 20-30 Juzo sleeve and glove to take to MD appt on Thursday for signiture   Consulted and Agree with Plan of Care Patient      Patient will benefit from skilled therapeutic intervention in order to improve the following deficits and impairments:  Decreased range of motion, Increased fascial restricitons, Impaired UE functional use, Increased edema, Decreased strength, Postural dysfunction  Visit Diagnosis: Lymphedema, not elsewhere classified  Stiffness of right shoulder, not elsewhere classified  Pain In Right Arm     Problem List Patient Active Problem List   Diagnosis Date Noted  . Long term current use of anticoagulant therapy 08/23/2015  . DVT, lower extremity ([place]) 06/18/2015  . Bilateral knee pain 04/03/2015  . Arm edema 08/28/2014  . Vitamin D deficiency 04/23/2014  . Hyperparathyroidism ([place]) 04/23/2014  . Depression 07/18/2013  . Breast cancer of upper-outer quadrant of right female breast ([place]) 07/15/2013  . Encounter for therapeutic drug monitoring  07/05/2013  . Overactive bladder 01/30/2013  . DVT (deep venous thrombosis), left 12/19/2012  . HTN (hypertension) 09/27/2011  . Hearing loss 09/27/2011  . Chest wall pain 09/27/2011  . Seasonal allergies 09/27/2011  . SOB (shortness of breath), related to deconditioning 08/26/2011  . Hypothyroid 08/26/2011   Helene Kelp  Bethena Midget, PT   09/10/2015, 5:18 PM  [place], [name], [address] Phone: [phone]   Fax:  [phone]  Name: Alicia Vasquez MRN: [id number] Date of Birth: 1941/03/10

## 2015-09-10 NOTE — Patient Instructions (Signed)
Cane Overhead - Supine  Hold cane at thighs with both hands, extend arms straight over head. Hold 1-2_ seconds. Repeat 10___ times. Do __2_ times per day.  External Rotation (Eccentric), Active-Assist - Supine (Cane)  Lie on back, affected arm out from side, elbow at 90, forearm forward. Use cane to assist in lifting forearm of affected arm to neutral. . 5-10___ reps per set,  Copyright  VHI. All rights reserved.    Continue walking program at home !

## 2015-09-15 ENCOUNTER — Ambulatory Visit: Payer: Medicare Other | Admitting: Physical Therapy

## 2015-09-15 DIAGNOSIS — M25611 Stiffness of right shoulder, not elsewhere classified: Secondary | ICD-10-CM | POA: Diagnosis not present

## 2015-09-15 DIAGNOSIS — I890 Lymphedema, not elsewhere classified: Secondary | ICD-10-CM | POA: Diagnosis not present

## 2015-09-15 DIAGNOSIS — M79601 Pain in right arm: Secondary | ICD-10-CM | POA: Diagnosis not present

## 2015-09-15 NOTE — Therapy (Signed)
[place], [name], [address] Phone: [phone]   Fax:  [phone]  Physical Therapy Treatment  Patient Details  Name: Alicia Vasquez MRN: [id number] Date of Birth: Jun 13, 1940 Referring Provider: Drue Second  Encounter Date: 09/15/2015      PT End of Session - 09/15/15 1215    Visit Number 6   Number of Visits 9   Date for PT Re-Evaluation 10/02/15   Authorization Type April cert done   PT Start Time 1115  pt late for appointment    PT Stop Time 1155   PT Time Calculation (min) 40 min   Activity Tolerance Patient tolerated treatment well   Behavior During Therapy [facility] for tasks assessed/performed      Past Medical History  Diagnosis Date  . Allergy   . Hypertension   . Hypothyroid   . Blood transfusion without reported diagnosis   . Pneumonia   . DVT (deep vein thrombosis) in pregnancy   . Breast cancer ([place]) 07/12/13    invasive mammary carcinoma  . Anxiety   . Arthritis   . Radiation 11/21/13-01/07/14    Right Breast/supraclav.    Past Surgical History  Procedure Laterality Date  . Removal of teeth  04/19/2012    13 teeth removed  . Biospy      female organs  . Dilation and curettage of uterus    . Breast lumpectomy with radioactive seed localization Right 09/09/2013    Procedure: BREAST LUMPECTOMY WITH RADIOACTIVE SEED LOCALIZATION WITH AXILLARY NODE EXCISION;  Surgeon: [name], MD;  Location: [place];  Service: General;  Laterality: Right;  . Re-excision of breast lumpectomy Right 09/24/2013    Procedure: RE-EXCISION OF RIGHT BREAST LUMPECTOMY;  Surgeon: [name], MD;  Location: [place];  Service: General;  Laterality: Right;    There were no vitals filed for this visit.      Subjective Assessment - 09/15/15 1117    Subjective Pt states her arm is actually aching  down arm and to breast.  " i had to take 2 tylenol  already"    Pertinent History breast cancer in 2015 with lumpectomy and radiation.  Multiple complaints of pain in back, legs, neck shoulder and arm. Pt reports she has not followed up with community exercise progra    Patient Stated Goals to get help with pain and swelling    Currently in Pain? Yes   Pain Score 8    Pain Location Arm            [facility] PT Assessment - 09/15/15 0001    AROM   Right Shoulder ABduction 124 Degrees                     [facility] Adult PT Treatment/Exercise - 09/15/15 1213    Self-Care   Other Self-Care Comments  gave pt prescription for Juzo sleeve and gauntlet 20-30 mmhg  to take to MD office on Thursday    Shoulder Exercises: Supine   Other Supine Exercises dowel rod for shoulder flexion    Manual Therapy   Manual Lymphatic Drainage (MLD) In Supine: Short neck, superficial and deep abdominals, Lt axillary nodes, anterior inter-axillary anastomosis, and Rt UE from dorsal hand to lateral shoulder.                         [facility] Goals - 09/15/15 1149    CC  Long Term Goal  #1   Title Patient will report a decrease in pain in right arm by 50% so they can perform daily activities with greater ease   Status Achieved   CC Long Term Goal  #2   Title Pt will have increase in right shoulder abduction to 110 degrees so that she can reach into a cabinet with right arm    Baseline 85 on 08/25/2015, 124 degrees on 09/15/2015   Status Achieved   CC Long Term Goal  #3   Title Pt will have a decrease in Lymphedema Life Impact Scale to 50 % impaired    Status On-going   CC Long Term Goal  #4   Title Pt will verbalize that she can get and use a comprssion sleeve to help manage lymphedema and arm pain    Status On-going   CC Long Term Goal  #5   Title Pt will have a dcrease in 1 cm of right arm at 10 cm above the ulnar styloid    Baseline 26.6 cm on 08/26/2015  25.4 on 09/10/2015    Status Achieved            Plan - 09/15/15  1119    Clinical Impression Statement Pt conitnues to have complaints of pain but states that she is much better than when she first started coming. She will get presciption signed on Thursday and have it ready to get her sleeve sometime in the next couple of weeks    Clinical Impairments Affecting Rehab Potential previous radiation, chronic pain limiting her activities    PT Next Visit Plan continue with manual lymph drainage and active exercise for upper quarter.   Consulted and Agree with Plan of Care Patient      Patient will benefit from skilled therapeutic intervention in order to improve the following deficits and impairments:  Decreased range of motion, Increased fascial restricitons, Impaired UE functional use, Increased edema, Decreased strength, Postural dysfunction  Visit Diagnosis: Lymphedema, not elsewhere classified  Stiffness of right shoulder, not elsewhere classified  Pain In Right Arm     Problem List Patient Active Problem List   Diagnosis Date Noted  . Long term current use of anticoagulant therapy 08/23/2015  . DVT, lower extremity ([place]) 06/18/2015  . Bilateral knee pain 04/03/2015  . Arm edema 08/28/2014  . Vitamin D deficiency 04/23/2014  . Hyperparathyroidism ([place]) 04/23/2014  . Depression 07/18/2013  . Breast cancer of upper-outer quadrant of right female breast ([place]) 07/15/2013  . Encounter for therapeutic drug monitoring 07/05/2013  . Overactive bladder 01/30/2013  . DVT (deep venous thrombosis), left 12/19/2012  . HTN (hypertension) 09/27/2011  . Hearing loss 09/27/2011  . Chest wall pain 09/27/2011  . Seasonal allergies 09/27/2011  . SOB (shortness of breath), related to deconditioning 08/26/2011  . Hypothyroid 08/26/2011   [name]. Owens Shark, PT  09/15/2015, 12:20 PM  [place], [name], [address] Phone: [phone]   Fax:  [phone]  Name: YAMELY WESTCOTT MRN:  [id number] Date of Birth: 1941/03/21

## 2015-09-16 ENCOUNTER — Other Ambulatory Visit: Payer: Self-pay

## 2015-09-16 DIAGNOSIS — C50411 Malignant neoplasm of upper-outer quadrant of right female breast: Secondary | ICD-10-CM

## 2015-09-17 ENCOUNTER — Other Ambulatory Visit (HOSPITAL_BASED_OUTPATIENT_CLINIC_OR_DEPARTMENT_OTHER): Payer: Medicare Other

## 2015-09-17 ENCOUNTER — Ambulatory Visit: Payer: Medicare Other | Admitting: Physical Therapy

## 2015-09-17 ENCOUNTER — Telehealth: Payer: Self-pay | Admitting: Nurse Practitioner

## 2015-09-17 ENCOUNTER — Encounter: Payer: Self-pay | Admitting: Nurse Practitioner

## 2015-09-17 ENCOUNTER — Ambulatory Visit (HOSPITAL_BASED_OUTPATIENT_CLINIC_OR_DEPARTMENT_OTHER): Payer: Medicare Other | Admitting: Nurse Practitioner

## 2015-09-17 VITALS — BP 114/51 | HR 74 | Temp 97.6°F | Resp 18 | Wt 212.9 lb

## 2015-09-17 DIAGNOSIS — I82402 Acute embolism and thrombosis of unspecified deep veins of left lower extremity: Secondary | ICD-10-CM | POA: Diagnosis not present

## 2015-09-17 DIAGNOSIS — C50411 Malignant neoplasm of upper-outer quadrant of right female breast: Secondary | ICD-10-CM

## 2015-09-17 DIAGNOSIS — I82403 Acute embolism and thrombosis of unspecified deep veins of lower extremity, bilateral: Secondary | ICD-10-CM | POA: Diagnosis not present

## 2015-09-17 DIAGNOSIS — I890 Lymphedema, not elsewhere classified: Secondary | ICD-10-CM | POA: Diagnosis not present

## 2015-09-17 DIAGNOSIS — M25611 Stiffness of right shoulder, not elsewhere classified: Secondary | ICD-10-CM | POA: Diagnosis not present

## 2015-09-17 DIAGNOSIS — M79601 Pain in right arm: Secondary | ICD-10-CM | POA: Diagnosis not present

## 2015-09-17 LAB — CBC WITH DIFFERENTIAL/PLATELET
BASO%: 0.2 % (ref 0.0–2.0)
BASOS ABS: 0 10*3/uL (ref 0.0–0.1)
EOS%: 1.2 % (ref 0.0–7.0)
Eosinophils Absolute: 0.1 10*3/uL (ref 0.0–0.5)
HCT: 41.8 % (ref 34.8–46.6)
HEMOGLOBIN: 13.7 g/dL (ref 11.6–15.9)
LYMPH%: 38.1 % (ref 14.0–49.7)
MCH: 30.1 pg (ref 25.1–34.0)
MCHC: 32.8 g/dL (ref 31.5–36.0)
MCV: 91.7 fL (ref 79.5–101.0)
MONO#: 0.9 10*3/uL (ref 0.1–0.9)
MONO%: 17.5 % — AB (ref 0.0–14.0)
NEUT#: 2.2 10*3/uL (ref 1.5–6.5)
NEUT%: 43 % (ref 38.4–76.8)
Platelets: 190 10*3/uL (ref 145–400)
RBC: 4.56 10*6/uL (ref 3.70–5.45)
RDW: 14.3 % (ref 11.2–14.5)
WBC: 5.1 10*3/uL (ref 3.9–10.3)
lymph#: 1.9 10*3/uL (ref 0.9–3.3)

## 2015-09-17 LAB — COMPREHENSIVE METABOLIC PANEL
ALT: 35 U/L (ref 0–55)
AST: 25 U/L (ref 5–34)
Albumin: 3.8 g/dL (ref 3.5–5.0)
Alkaline Phosphatase: 116 U/L (ref 40–150)
Anion Gap: 7 mEq/L (ref 3–11)
BUN: 18.2 mg/dL (ref 7.0–26.0)
CHLORIDE: 107 meq/L (ref 98–109)
CO2: 26 meq/L (ref 22–29)
Calcium: 10.4 mg/dL (ref 8.4–10.4)
Creatinine: 1 mg/dL (ref 0.6–1.1)
EGFR: 54 mL/min/{1.73_m2} — ABNORMAL LOW (ref >90)
GLUCOSE: 95 mg/dL (ref 70–140)
POTASSIUM: 4.5 meq/L (ref 3.5–5.1)
SODIUM: 140 meq/L (ref 136–145)
Total Bilirubin: 0.46 mg/dL (ref 0.20–1.20)
Total Protein: 7.3 g/dL (ref 6.4–8.3)

## 2015-09-17 LAB — PROTIME-INR
INR: 1.3 — AB (ref 2.00–3.50)
PROTIME: 15.6 s — AB (ref 10.6–13.4)

## 2015-09-17 NOTE — Therapy (Signed)
[place], [name], [address] Phone: [phone]   Fax:  [phone]  Physical Therapy Treatment  Patient Details  Name: Alicia Vasquez MRN: [id number] Date of Birth: 03/01/1941 Referring Provider: Drue Second  Encounter Date: 09/17/2015      PT End of Session - 09/17/15 1340    Visit Number 7   Number of Visits 9   Authorization Type April cert done   PT Start Time 1303   PT Stop Time 1341   PT Time Calculation (min) 38 min   Activity Tolerance Patient tolerated treatment well   Behavior During Therapy [facility] for tasks assessed/performed      Past Medical History  Diagnosis Date  . Allergy   . Hypertension   . Hypothyroid   . Blood transfusion without reported diagnosis   . Pneumonia   . DVT (deep vein thrombosis) in pregnancy   . Breast cancer ([place]) 07/12/13    invasive mammary carcinoma  . Anxiety   . Arthritis   . Radiation 11/21/13-01/07/14    Right Breast/supraclav.    Past Surgical History  Procedure Laterality Date  . Removal of teeth  04/19/2012    13 teeth removed  . Biospy      female organs  . Dilation and curettage of uterus    . Breast lumpectomy with radioactive seed localization Right 09/09/2013    Procedure: BREAST LUMPECTOMY WITH RADIOACTIVE SEED LOCALIZATION WITH AXILLARY NODE EXCISION;  Surgeon: [name], MD;  Location: [place];  Service: General;  Laterality: Right;  . Re-excision of breast lumpectomy Right 09/24/2013    Procedure: RE-EXCISION OF RIGHT BREAST LUMPECTOMY;  Surgeon: [name], MD;  Location: [place];  Service: General;  Laterality: Right;    There were no vitals filed for this visit.      Subjective Assessment - 09/17/15 1308    Subjective Pt states she has taken tylenol a few times.  She is having trouble with the pollen.  She felt that her arm went down, but it filled back up after she did some  paperwork.    Pertinent History breast cancer in 2015 with lumpectomy and radiation.  Multiple complaints of pain in back, legs, neck shoulder and arm. Pt reports she has not followed up with community exercise progra    Patient Stated Goals to get help with pain and swelling    Currently in Pain? Yes   Pain Score 6    Pain Location Arm               LYMPHEDEMA/ONCOLOGY QUESTIONNAIRE - 09/17/15 1310    Right Upper Extremity Lymphedema   15 cm Proximal to Olecranon Process 36 cm   10 cm Proximal to Olecranon Process 35 cm   Olecranon Process 29 cm   15 cm Proximal to Ulnar Styloid Process 27 cm   10 cm Proximal to Ulnar Styloid Process 25 cm   Just Proximal to Ulnar Styloid Process 17 cm   Across Hand at [employer] 19 cm   At [place] of 2nd Digit 6 cm                  [facility] Adult PT Treatment/Exercise - 09/17/15 0001    Shoulder Exercises: Supine   Other Supine Exercises dowel rod for shoulder flexion  and abduction 10 reps each    Manual Therapy   Manual Lymphatic Drainage (MLD) In Supine: Short neck, superficial and deep abdominals,  Lt axillary nodes, anterior inter-axillary anastomosis, and Rt UE from dorsal hand to lateral shoulder.                         [facility] Goals - 09/15/15 [place] Term Goal  #1   Title Patient will report a decrease in pain in right arm by 50% so they can perform daily activities with greater ease   Status Achieved   [facility] Long Term Goal  #2   Title Pt will have increase in right shoulder abduction to 110 degrees so that she can reach into a cabinet with right arm    Baseline 85 on 08/25/2015, 124 degrees on 09/15/2015   Status Achieved   CC Long Term Goal  #3   Title Pt will have a decrease in Lymphedema Life Impact Scale to 50 % impaired    Status On-going   CC Long Term Goal  #4   Title Pt will verbalize that she can get and use a comprssion sleeve to help manage lymphedema and arm pain    Status  On-going   CC Long Term Goal  #5   Title Pt will have a dcrease in 1 cm of right arm at 10 cm above the ulnar styloid    Baseline 26.6 cm on 08/26/2015  25.4 on 09/10/2015    Status Achieved            Plan - 09/17/15 1341    Clinical Impression Statement Pt continues to improve in fullness of right arm and is able to do dowel rod exercise.  Encouarged pt to continue doing them at home. She is ready to get measured for her compression    Rehab Potential Good   Clinical Impairments Affecting Rehab Potential previous radiation, chronic pain limiting her activities    PT Next Visit Plan continue with manual lymph drainage and active exercise for upper quarter.   Consulted and Agree with Plan of Care Patient      Patient will benefit from skilled therapeutic intervention in order to improve the following deficits and impairments:  Decreased range of motion, Increased fascial restricitons, Impaired UE functional use, Increased edema, Decreased strength, Postural dysfunction  Visit Diagnosis: Lymphedema, not elsewhere classified     Problem List Patient Active Problem List   Diagnosis Date Noted  . Long term current use of anticoagulant therapy 08/23/2015  . DVT, lower extremity ([place]) 06/18/2015  . Bilateral knee pain 04/03/2015  . Arm edema 08/28/2014  . Vitamin D deficiency 04/23/2014  . Hyperparathyroidism ([place]) 04/23/2014  . Depression 07/18/2013  . Breast cancer of upper-outer quadrant of right female breast ([name]) 07/15/2013  . Encounter for therapeutic drug monitoring 07/05/2013  . Overactive bladder 01/30/2013  . DVT (deep venous thrombosis), left 12/19/2012  . HTN (hypertension) 09/27/2011  . Hearing loss 09/27/2011  . Chest wall pain 09/27/2011  . Seasonal allergies 09/27/2011  . SOB (shortness of breath), related to deconditioning 08/26/2011  . Hypothyroid 08/26/2011   [name]. Owens Shark, PT   09/17/2015, 1:44 PM  [place], [name], [address] Phone: [phone]   Fax:  [phone]  Name: Alicia Vasquez MRN: [id number] Date of Birth: 09-20-1940

## 2015-09-17 NOTE — Telephone Encounter (Signed)
appt made and avs printed °

## 2015-09-17 NOTE — Progress Notes (Signed)
[facility]  [phone] [phone]     ID: Alicia Vasquez OB: 08-29-1940  MR#: [id number]  JOI#:[id number]  PCP: [name], MD GYN:   SU: [name] OTHER MD: Thea Silversmith, Hart Robinsons, Philemon Kingdom  CHIEF COMPLAINT: Estrogen receptor positive breast cancer  CURRENT TREATMENT: observation  BREAST CANCER HISTORY: From doctor [name]'s intake node 07/24/2013:  "75 y.o. female. Who presented with SOB and had a CT chest performed that revealed a right breast mass. Mammogram/ultrsound on 2/13 showed a mass in the 11 o'clock position in the right breast measuring 1.5 cm. Also noted was a right axillary LN measuring 1.6 cm. MRI not performed. Biopsy of mass and lymph done. 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 Lymph node + for metastatic carcinoma."  [On 09/09/2013 the patient underwent right lumpectomy and sentinel lymph node sampling. This showed (SZA [phone]) multifocal invasive ductal carcinoma, grade 1, the largest lesion measuring 1.8 cm, the second lesion 1.2 cm. One of 4 sentinel lymph nodes was positive, with extracapsular extension. Margins were positive. HER-2 was repeated and was again negative. Further surgery 09/16/2013 obtained clear margins.  Her subsequent history is as detailed below  INTERVAL HISTORY: Alicia Vasquez returns today for follow up of her estrogen receptor positive breast cancer. She has still not started on anastrozole, citing a recent bout of pneumonia, fear of side effects, and concern about drug interactions. She continues on coumadin, '5mg'$  on Thursday, Saturday, and Sunday and 7.'5mg'$  on all other days. She denies abnormal bruising or bleeding.   REVIEW OF SYSTEMS: Alicia Vasquez has been working with physical therapy for her right upper arm discomfort. She has also been told that there may be a pinched  nerve to her right upper back. She also has chronic low back pain. She takes tylenol PRN for pain. She is chronically fatigued. She is short of breath with exertion, but denies chest pain, cough, or palpitations. She does not exercise at all, but has lost 10lb by following a diabetic diet. A detailed review of systems is otherwise stable.  PAST MEDICAL HISTORY: Past Medical History  Diagnosis Date  . Allergy   . Hypertension   . Hypothyroid   . Blood transfusion without reported diagnosis   . Pneumonia   . DVT (deep vein thrombosis) in pregnancy   . Breast cancer ([place]) 07/12/13    invasive mammary carcinoma  . Anxiety   . Arthritis   . Radiation 11/21/13-01/07/14    Right Breast/supraclav.    PAST SURGICAL HISTORY: Past Surgical History  Procedure Laterality Date  . Removal of teeth  04/19/2012    13 teeth removed  . Biospy      female organs  . Dilation and curettage of uterus    . Breast lumpectomy with radioactive seed localization Right 09/09/2013    Procedure: BREAST LUMPECTOMY WITH RADIOACTIVE SEED LOCALIZATION WITH AXILLARY NODE EXCISION;  Surgeon: [name], MD;  Location: [place];  Service: General;  Laterality: Right;  . Re-excision of breast lumpectomy Right 09/24/2013    Procedure: RE-EXCISION OF RIGHT BREAST LUMPECTOMY;  Surgeon: [name], MD;  Location: [place] [facility];  Service: General;  Laterality: Right;    FAMILY HISTORY Family History  Problem Relation Age of Onset  . Cancer Brother     prostate  . Heart disease Brother   . Colon cancer Brother    the patient's  father died at the age of 10 after an automobile accident. The patient's mother died at the age of 33. She was a Engineer, civil (consulting) here in [place] in the old [facility]. She was infected with polio and was confined to a wheelchair for a good part of her life. She eventually died of pneumonia. The patient had one brother, who died with prostate cancer. She had no  sisters. There is no history of breast or ovarian cancer in the family.  GYNECOLOGIC HISTORY:  Menarche age 36, first live birth age 3, the patient is GX P1. She went through the change of life at age 56. 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 She is widowed. Currently she is staying with her friend Alicia Vasquez,  Weldon Inches is a retired Chartered loss adjuster. The patient's son Alicia Vasquez lives in [place]. He works an Consulting civil engineer. The patient has no grandchildren. She is a International aid/development worker but currently attends a [employer] with her friend Alicia Vasquez    ADVANCED DIRECTIVES: Not in place   HEALTH MAINTENANCE: Social History  Substance Use Topics  . Smoking status: Never Smoker   . Smokeless tobacco: Never Used  . Alcohol Use: No     Colonoscopy: Never  PAP:  Bone density: 10/01/2009; lowest T score -0.8  Lipid panel:  Allergies  Allergen Reactions  . Anesthetics, [place] Other (See Comments)    ELEVATE BLOOD PRESSURE  . Benadryl [Diphenhydramine Hcl]     dizziness  . Carbocaine [Mepivacaine Hcl] Other (See Comments)    Elevated blood pressure  . Codeine Nausea Only and Other (See Comments)    dizziness  . Epinephrine Other (See Comments)    Elevated blood pressure  . Sulfa Antibiotics Other (See Comments)    dizziness  . Vicodin [Hydrocodone-Acetaminophen] Nausea Only  . Penicillins Rash    Current Outpatient Prescriptions  Medication Sig Dispense Refill  . acetaminophen (TYLENOL) 500 MG tablet Take 500 mg by mouth every 6 (six) hours as needed for mild pain, fever or headache. Reported on 09/08/2015    . Cholecalciferol (VITAMIN D3) 5000 UNITS TABS Take by mouth.    . levothyroxine (SYNTHROID) 125 MCG tablet Take 1 tablet (125 mcg total) by mouth daily before breakfast. Dispense as written: Synthroid 90 tablet 3  . lisinopril (PRINIVIL,ZESTRIL) 10 MG tablet Take 1 tablet (10 mg total) by mouth daily. 90 tablet 0  .  non-metallic deodorant (ALRA) MISC Apply 1 application topically daily as needed. Reported on 08/25/2015    . oxyCODONE (OXY IR/ROXICODONE) 5 MG immediate release tablet Take 1 tablet (5 mg total) by mouth every 6 (six) hours as needed for severe pain. 20 tablet 0  . warfarin (JANTOVEN) 5 MG tablet TAKE AS DIRECTED BY ANTICOAGULATION CLINIC 60 tablet 3   No current facility-administered medications for this visit.    OBJECTIVE: Older white woman who appears stated age  75 Vitals:   09/17/15 1448  BP: 114/51  Pulse: 74  Temp: 97.6 F (36.4 C)  Resp: 18     Body mass index is 36.53 kg/(m^2).    ECOG FS:1 - Symptomatic but completely ambulatory  Skin: warm, dry  HEENT: sclerae anicteric, conjunctivae pink, oropharynx clear. No thrush or mucositis. Poor dentition Lymph Nodes: No cervical or supraclavicular lymphadenopathy  Lungs: clear to auscultation bilaterally, no rales, wheezes, or rhonci  Heart: regular rate and rhythm  Abdomen: round, soft, non tender, positive bowel sounds  Musculoskeletal: scoliosis, focal  spinal tenderness, right upper extremity grade 1 lymphedema  Neuro: non focal, well oriented, positive affect  Breast: deferred   LAB RESULTS:  CMP     Component Value Date/Time   NA 140 09/17/2015 1427   NA 141 08/19/2015 1602   K 4.5 09/17/2015 1427   K 4.6 08/19/2015 1602   CL 105 08/19/2015 1602   CO2 26 09/17/2015 1427   CO2 27 08/19/2015 1602   GLUCOSE 95 09/17/2015 1427   GLUCOSE 105* 08/19/2015 1602   BUN 18.2 09/17/2015 1427   BUN 14 08/19/2015 1602   CREATININE 1.0 09/17/2015 1427   CREATININE 0.77 08/19/2015 1602   CREATININE 0.77 09/05/2013 1330   CALCIUM 10.4 09/17/2015 1427   CALCIUM 10.2 08/19/2015 1602   PROT 7.3 09/17/2015 1427   PROT 6.5 08/19/2015 1602   ALBUMIN 3.8 09/17/2015 1427   ALBUMIN 4.1 08/19/2015 1602   AST 25 09/17/2015 1427   AST 20 08/19/2015 1602   ALT 35 09/17/2015 1427   ALT 26 08/19/2015 1602   ALKPHOS 116 09/17/2015  1427   ALKPHOS 75 08/19/2015 1602   BILITOT 0.46 09/17/2015 1427   BILITOT 0.4 08/19/2015 1602   GFRNONAA 75 08/19/2015 1602   GFRNONAA 81* 09/05/2013 1330   GFRAA 87 08/19/2015 1602   GFRAA >90 09/05/2013 1330    I No results found for: SPEP  Lab Results  Component Value Date   WBC 5.1 09/17/2015   NEUTROABS 2.2 09/17/2015   HGB 13.7 09/17/2015   HCT 41.8 09/17/2015   MCV 91.7 09/17/2015   PLT 190 09/17/2015      Chemistry      Component Value Date/Time   NA 140 09/17/2015 1427   NA 141 08/19/2015 1602   K 4.5 09/17/2015 1427   K 4.6 08/19/2015 1602   CL 105 08/19/2015 1602   CO2 26 09/17/2015 1427   CO2 27 08/19/2015 1602   BUN 18.2 09/17/2015 1427   BUN 14 08/19/2015 1602   CREATININE 1.0 09/17/2015 1427   CREATININE 0.77 08/19/2015 1602   CREATININE 0.77 09/05/2013 1330      Component Value Date/Time   CALCIUM 10.4 09/17/2015 1427   CALCIUM 10.2 08/19/2015 1602   ALKPHOS 116 09/17/2015 1427   ALKPHOS 75 08/19/2015 1602   AST 25 09/17/2015 1427   AST 20 08/19/2015 1602   ALT 35 09/17/2015 1427   ALT 26 08/19/2015 1602   BILITOT 0.46 09/17/2015 1427   BILITOT 0.4 08/19/2015 1602       No results found for: LABCA2  No components found for: LABCA125   Recent Labs Lab 09/17/15 1427  INR 1.30*    Urinalysis    Component Value Date/Time   BILIRUBINUR negative 02/27/2015 1714   BILIRUBINUR neg 02/13/2015 1211   KETONESUR negative 02/27/2015 1714   PROTEINUR negative 02/27/2015 1714   PROTEINUR neg 02/13/2015 1211   UROBILINOGEN 0.2 02/27/2015 1714   NITRITE Negative 02/27/2015 1714   NITRITE neg 02/13/2015 1211   LEUKOCYTESUR Negative 02/27/2015 1714    STUDIES: Dg Chest 2 View  08/19/2015  CLINICAL DATA:  Shortness of breath, history of right breast carcinoma, swelling of the right arm EXAM: CHEST  2 VIEW COMPARISON:  Chest x-ray of 04/16/2015, and CT chest of 02/20/2015 FINDINGS: No active infiltrate or effusion is seen. The right apical  opacity is stable and by CT appears to represent pleural-parenchymal scarring. Mediastinal and hilar contours are unremarkable. The heart is borderline enlarged. No acute bony abnormality is seen. IMPRESSION:  No active infiltrate or effusion. Stable pleural parenchymal scarring in the right lung apex. Electronically Signed   By: Ivar Drape M.D.   On: 08/19/2015 16:22   Mm Diag Breast Tomo Bilateral  09/04/2015  CLINICAL DATA:  History of right breast cancer status post lumpectomy 2015. Annual examination. EXAM: 2D DIGITAL DIAGNOSTIC BILATERAL MAMMOGRAM WITH CAD AND ADJUNCT TOMO COMPARISON:  Previous exam(s). ACR Breast Density Category c: The breast tissue is heterogeneously dense, which may obscure small masses. FINDINGS: There are lumpectomy changes in the deep upper outer right breast. No mass, nonsurgical distortion, or suspicious microcalcification is identified in either breast to suggest malignancy. Mammographic images were processed with CAD. IMPRESSION: No evidence of malignancy in either breast. Lumpectomy changes on the right. RECOMMENDATION: Diagnostic mammogram is suggested in 1 year. (Code:DM-B-01Y) I have discussed the findings and recommendations with the patient. Results were also provided in writing at the conclusion of the visit. If applicable, a reminder letter will be sent to the patient regarding the next appointment. BI-RADS CATEGORY  2: Benign. Electronically Signed   By: Curlene Dolphin M.D.   On: 09/04/2015 16:43    ASSESSMENT: 75 y.o.  woman status post right lumpectomy and sentinel lymph node sampling 09/09/2013 for an mpT1c pN1a, stage IIA invasive ductal carcinoma, estrogen and progesterone receptor both 100% positive with strong staining intensity, MIB-1 of 17% and no HER-2 amplification  (1) additional surgery for margin clearance 09/16/2013 obtained negative margins  (2) Oncotype DX recurrence score of 4 predicts a risk of outside the breast recurrence within 10  years of 7% if the patient's only systemic therapy is tamoxifen for 5 years. It also predicts no benefit from chemotherapy  (3) additional right breast surgery for margin clearance was successful, 09/16/2048  (4) adjuvant radiation completed 01/07/2014  (5) anastrozole started 02/27/2014 stopped within 2 weeks because of arm swelling.   (a) bone density April 2016 showed osteopenia, with a t-score of -1.6  PLAN: Sandeep and I repeated previous discussions about antiestrogen therapy reducing her risk of recurrence by as much as 50%. We reviewed the potential side effects, which I think is the real reason she has not started this drug, but I explained that anything she does experience would be temporary. The side effects would resolve shortly after stopping the drug. She may not experience anything at all. All I am asking her to do is try. She assures me that she will this time. She understands that the anastrozole is unlikely to interfere with any of her current medicines, and will not increase her risk of blood clots like tamoxifen.   I have signed a permission form for her to be fit for a compression sleeve. Her right arm lymphedema is the likely source of discomfort to this area.   The labs were reviewed in detail and her INR is very low at 1.3. She will increase her coumadin to 7.21m daily, and we will recheck in 1 week.  MAmandalynnwill return in 3 months for follow up with Dr. MJana Hakim If she has not started anastrozole by then, and will admit that she does not intend to, we can move to 6 month follow up. She understands and agrees with this plan. She knows the goal of treatment in her case is cure. She has been encouraged to call with any issues that might arise before her next visit here.    HLaurie Panda NP   09/17/2015 4:14 PM

## 2015-09-18 ENCOUNTER — Telehealth: Payer: Self-pay

## 2015-09-18 NOTE — Telephone Encounter (Signed)
INR-1.3 Present dose-  5mg  alt days 7.5 mg Dose change per Dr. Jana Hakim- 7.5 mg daily Repeat Lab in one week-09/25/15 Patient aware

## 2015-09-22 ENCOUNTER — Ambulatory Visit: Payer: Medicare Other | Admitting: Physical Therapy

## 2015-09-22 DIAGNOSIS — M79601 Pain in right arm: Secondary | ICD-10-CM | POA: Diagnosis not present

## 2015-09-22 DIAGNOSIS — I890 Lymphedema, not elsewhere classified: Secondary | ICD-10-CM | POA: Diagnosis not present

## 2015-09-22 DIAGNOSIS — M25611 Stiffness of right shoulder, not elsewhere classified: Secondary | ICD-10-CM | POA: Diagnosis not present

## 2015-09-22 NOTE — Therapy (Signed)
[place], [name], [address] Phone: [phone]   Fax:  [phone]  Physical Therapy Treatment  Patient Details  Name: Alicia Vasquez MRN: [id number] Date of Birth: 1941-05-25 Referring Provider: Drue Second  Encounter Date: 09/22/2015      PT End of Session - 09/22/15 1503    Visit Number 8   Number of Visits 9   Date for PT Re-Evaluation 10/02/15   Authorization Type April cert done   PT Start Time 1308   PT Stop Time 1348   PT Time Calculation (min) 40 min   Activity Tolerance Patient tolerated treatment well   Behavior During Therapy [facility] for tasks assessed/performed      Past Medical History  Diagnosis Date  . Allergy   . Hypertension   . Hypothyroid   . Blood transfusion without reported diagnosis   . Pneumonia   . DVT (deep vein thrombosis) in pregnancy   . Breast cancer ([place]) 07/12/13    invasive mammary carcinoma  . Anxiety   . Arthritis   . Radiation 11/21/13-01/07/14    Right Breast/supraclav.    Past Surgical History  Procedure Laterality Date  . Removal of teeth  04/19/2012    13 teeth removed  . Biospy      female organs  . Dilation and curettage of uterus    . Breast lumpectomy with radioactive seed localization Right 09/09/2013    Procedure: BREAST LUMPECTOMY WITH RADIOACTIVE SEED LOCALIZATION WITH AXILLARY NODE EXCISION;  Surgeon: [name], MD;  Location: [place];  Service: General;  Laterality: Right;  . Re-excision of breast lumpectomy Right 09/24/2013    Procedure: RE-EXCISION OF RIGHT BREAST LUMPECTOMY;  Surgeon: [name], MD;  Location: [place];  Service: General;  Laterality: Right;    There were no vitals filed for this visit.      Subjective Assessment - 09/22/15 1309    Subjective Pt states she got prescription signed for her compression sleeve but has not called for appointment.  Her MD appt went well  except that her blood was "too thick"  She has follow up appointment with Dr. Donne Hazel this week    Pertinent History breast cancer in 2015 with lumpectomy and radiation.  Multiple complaints of pain in back, legs, neck shoulder and arm. Pt reports she has not followed up with community exercise progra    Patient Stated Goals to get help with pain and swelling    Currently in Pain? Yes   Pain Score 6    Pain Location Chest  also back chest and and knee. chronic pain                LYMPHEDEMA/ONCOLOGY QUESTIONNAIRE - 09/22/15 1313    Right Upper Extremity Lymphedema   15 cm Proximal to Olecranon Process 36 cm   10 cm Proximal to Olecranon Process 34.5 cm   Olecranon Process 29 cm   15 cm Proximal to Ulnar Styloid Process 26.8 cm   10 cm Proximal to Ulnar Styloid Process 24.5 cm   Just Proximal to Ulnar Styloid Process 16.9 cm   Across Hand at [employer] 19 cm   At [place] of 2nd Digit 6 cm                  [facility] Adult PT Treatment/Exercise - 09/22/15 0001    Manual Therapy   Manual Lymphatic Drainage (MLD) In Supine: Short neck, superficial and deep abdominals,  Lt axillary nodes, anterior inter-axillary anastomosis, and Rt UE from dorsal hand to lateral shoulder.                         [facility] Goals - 09/15/15 [place] Term Goal  #1   Title Patient will report a decrease in pain in right arm by 50% so they can perform daily activities with greater ease   Status Achieved   CC Long Term Goal  #2   Title Pt will have increase in right shoulder abduction to 110 degrees so that she can reach into a cabinet with right arm    Baseline 85 on 08/25/2015, 124 degrees on 09/15/2015   Status Achieved   CC Long Term Goal  #3   Title Pt will have a decrease in Lymphedema Life Impact Scale to 50 % impaired    Status On-going   CC Long Term Goal  #4   Title Pt will verbalize that she can get and use a comprssion sleeve to help manage  lymphedema and arm pain    Status On-going   CC Long Term Goal  #5   Title Pt will have a dcrease in 1 cm of right arm at 10 cm above the ulnar styloid    Baseline 26.6 cm on 08/26/2015  25.4 on 09/10/2015    Status Achieved            Plan - 09/22/15 1504    Clinical Impression Statement Pt continues to improve.  She is ready to get compression sleeve, but just hasn't called yet.  Pt no longer needs skilled treatment for manua lymph drainage.  She will come back next  week after she gets compressin sleeve so that we can assess and make sure she can use it to [facility] her lymphedema at home    Clinical Impairments Affecting Rehab Potential previous radiation, chronic pain limiting her activities    PT Next Visit Plan Assess use and effectiveness of compression sleeve. dishcarge    Consulted and Agree with Plan of Care Patient      Patient will benefit from skilled therapeutic intervention in order to improve the following deficits and impairments:  Decreased range of motion, Increased fascial restricitons, Impaired UE functional use, Increased edema, Decreased strength, Postural dysfunction  Visit Diagnosis: Lymphedema, not elsewhere classified     Problem List Patient Active Problem List   Diagnosis Date Noted  . Lymphedema 09/17/2015  . Long term current use of anticoagulant therapy 08/23/2015  . DVT, lower extremity ([place]) 06/18/2015  . Bilateral knee pain 04/03/2015  . Arm edema 08/28/2014  . Vitamin D deficiency 04/23/2014  . Hyperparathyroidism ([place]) 04/23/2014  . Depression 07/18/2013  . Breast cancer of upper-outer quadrant of right female breast ([place]) 07/15/2013  . Encounter for therapeutic drug monitoring 07/05/2013  . Overactive bladder 01/30/2013  . DVT (deep venous thrombosis), left 12/19/2012  . HTN (hypertension) 09/27/2011  . Hearing loss 09/27/2011  . Chest wall pain 09/27/2011  . Seasonal allergies 09/27/2011  . SOB (shortness of breath), related to  deconditioning 08/26/2011  . Hypothyroid 08/26/2011  [name]. Owens Shark, PT 09/22/2015, 3:07 PM  [place], [name], [address] Phone: [phone]   Fax:  [phone]  Name: Alicia Vasquez MRN: [id number] Date of Birth: [birth year]

## 2015-09-24 ENCOUNTER — Emergency Department (HOSPITAL_COMMUNITY): Payer: No Typology Code available for payment source

## 2015-09-24 ENCOUNTER — Encounter: Payer: Medicare Other | Admitting: Physical Therapy

## 2015-09-24 ENCOUNTER — Emergency Department (HOSPITAL_COMMUNITY)
Admission: EM | Admit: 2015-09-24 | Discharge: 2015-09-24 | Disposition: A | Discharge status: Home / Self Care | Payer: No Typology Code available for payment source | Attending: Emergency Medicine | Admitting: Emergency Medicine

## 2015-09-24 ENCOUNTER — Encounter (HOSPITAL_COMMUNITY): Payer: Self-pay | Admitting: Emergency Medicine

## 2015-09-24 DIAGNOSIS — I82629 Acute embolism and thrombosis of deep veins of unspecified upper extremity: Secondary | ICD-10-CM

## 2015-09-24 DIAGNOSIS — R51 Headache: Secondary | ICD-10-CM | POA: Insufficient documentation

## 2015-09-24 DIAGNOSIS — S199XXA Unspecified injury of neck, initial encounter: Secondary | ICD-10-CM | POA: Diagnosis not present

## 2015-09-24 DIAGNOSIS — Z79899 Other long term (current) drug therapy: Secondary | ICD-10-CM | POA: Insufficient documentation

## 2015-09-24 DIAGNOSIS — E039 Hypothyroidism, unspecified: Secondary | ICD-10-CM | POA: Diagnosis not present

## 2015-09-24 DIAGNOSIS — M549 Dorsalgia, unspecified: Secondary | ICD-10-CM | POA: Insufficient documentation

## 2015-09-24 DIAGNOSIS — C50911 Malignant neoplasm of unspecified site of right female breast: Secondary | ICD-10-CM

## 2015-09-24 DIAGNOSIS — I82409 Acute embolism and thrombosis of unspecified deep veins of unspecified lower extremity: Secondary | ICD-10-CM

## 2015-09-24 DIAGNOSIS — R079 Chest pain, unspecified: Secondary | ICD-10-CM | POA: Diagnosis not present

## 2015-09-24 DIAGNOSIS — M542 Cervicalgia: Secondary | ICD-10-CM | POA: Diagnosis not present

## 2015-09-24 DIAGNOSIS — Z7901 Long term (current) use of anticoagulants: Secondary | ICD-10-CM | POA: Diagnosis not present

## 2015-09-24 DIAGNOSIS — M79605 Pain in left leg: Secondary | ICD-10-CM

## 2015-09-24 DIAGNOSIS — M79601 Pain in right arm: Secondary | ICD-10-CM | POA: Diagnosis not present

## 2015-09-24 DIAGNOSIS — Z79891 Long term (current) use of opiate analgesic: Secondary | ICD-10-CM | POA: Diagnosis not present

## 2015-09-24 DIAGNOSIS — Z86711 Personal history of pulmonary embolism: Secondary | ICD-10-CM

## 2015-09-24 DIAGNOSIS — Z853 Personal history of malignant neoplasm of breast: Secondary | ICD-10-CM | POA: Insufficient documentation

## 2015-09-24 DIAGNOSIS — I10 Essential (primary) hypertension: Secondary | ICD-10-CM | POA: Insufficient documentation

## 2015-09-24 DIAGNOSIS — R0789 Other chest pain: Secondary | ICD-10-CM

## 2015-09-24 DIAGNOSIS — S299XXA Unspecified injury of thorax, initial encounter: Secondary | ICD-10-CM | POA: Diagnosis not present

## 2015-09-24 DIAGNOSIS — S0990XA Unspecified injury of head, initial encounter: Secondary | ICD-10-CM | POA: Diagnosis not present

## 2015-09-24 LAB — PROTIME-INR
INR: 1.47 (ref 0.00–1.49)
Prothrombin Time: 17.9 seconds — ABNORMAL HIGH (ref 11.6–15.2)
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
## 2015-09-25 ENCOUNTER — Other Ambulatory Visit (HOSPITAL_BASED_OUTPATIENT_CLINIC_OR_DEPARTMENT_OTHER): Payer: Medicare Other

## 2015-09-25 DIAGNOSIS — I82402 Acute embolism and thrombosis of unspecified deep veins of left lower extremity: Secondary | ICD-10-CM | POA: Diagnosis not present

## 2015-09-25 DIAGNOSIS — C50411 Malignant neoplasm of upper-outer quadrant of right female breast: Secondary | ICD-10-CM

## 2015-09-25 LAB — PROTIME-INR
INR: 1.6 — ABNORMAL LOW (ref 2.00–3.50)
Protime: 19.2 Seconds — ABNORMAL HIGH (ref 10.6–13.4)

## 2015-09-28 ENCOUNTER — Other Ambulatory Visit: Payer: Self-pay | Admitting: *Deleted

## 2015-09-28 ENCOUNTER — Telehealth: Payer: Self-pay | Admitting: *Deleted

## 2015-09-28 DIAGNOSIS — I82402 Acute embolism and thrombosis of unspecified deep veins of left lower extremity: Secondary | ICD-10-CM
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
## 2015-09-29 ENCOUNTER — Encounter: Payer: Medicare Other | Admitting: Physical Therapy

## 2015-09-29 ENCOUNTER — Telehealth: Payer: Self-pay | Admitting: *Deleted
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
## 2015-10-01 ENCOUNTER — Other Ambulatory Visit: Payer: Medicare Other

## 2015-10-01 ENCOUNTER — Ambulatory Visit: Payer: Medicare Other | Attending: Nurse Practitioner | Admitting: Physical Therapy

## 2015-10-01 DIAGNOSIS — I890 Lymphedema, not elsewhere classified: Secondary | ICD-10-CM | POA: Insufficient documentation

## 2015-10-01 DIAGNOSIS — M79601 Pain in right arm: Secondary | ICD-10-CM

## 2015-10-01 DIAGNOSIS — M25611 Stiffness of right shoulder, not elsewhere classified: Secondary | ICD-10-CM | POA: Diagnosis not present

## 2015-10-02 DIAGNOSIS — C50911 Malignant neoplasm of unspecified site of right female breast: Secondary | ICD-10-CM | POA: Diagnosis not present
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
## 2015-10-06 ENCOUNTER — Other Ambulatory Visit: Payer: Self-pay | Admitting: Family Medicine

## 2015-10-06 ENCOUNTER — Other Ambulatory Visit (HOSPITAL_BASED_OUTPATIENT_CLINIC_OR_DEPARTMENT_OTHER): Payer: Medicare Other

## 2015-10-06 DIAGNOSIS — I82403 Acute embolism and thrombosis of unspecified deep veins of lower extremity, bilateral: Secondary | ICD-10-CM

## 2015-10-06 DIAGNOSIS — I82402 Acute embolism and thrombosis of unspecified deep veins of left lower extremity: Secondary | ICD-10-CM

## 2015-10-06 LAB — PROTIME-INR
INR: 2.8 (ref 2.00–3.50)
PROTIME: 33.6 s — AB (ref 10.6–13.4)

## 2015-10-07 ENCOUNTER — Ambulatory Visit: Payer: Medicare Other

## 2015-10-07 DIAGNOSIS — M25611 Stiffness of right shoulder, not elsewhere classified: Secondary | ICD-10-CM | POA: Diagnosis not present

## 2015-10-07 DIAGNOSIS — I890 Lymphedema, not elsewhere classified: Secondary | ICD-10-CM

## 2015-10-07 DIAGNOSIS — M79601 Pain in right arm: Secondary | ICD-10-CM | POA: Diagnosis not present
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
## 2015-10-08 NOTE — Telephone Encounter (Signed)
Dr L, you have seen pt for other issues in March and April, but don't see HTN addressed. Do you want to RF or RTC?

## 2015-10-09 ENCOUNTER — Telehealth: Payer: Self-pay | Admitting: *Deleted

## 2015-10-09 NOTE — Telephone Encounter (Signed)
INR 2.8  Coumadin 10 mg daily  Next lab 5/25  No changes per MD  This RN spoke with pt per above.

## 2015-10-13 ENCOUNTER — Ambulatory Visit: Payer: Medicare Other | Admitting: Physical Therapy

## 2015-10-13 DIAGNOSIS — I890 Lymphedema, not elsewhere classified: Secondary | ICD-10-CM

## 2015-10-13 DIAGNOSIS — M79601 Pain in right arm: Secondary | ICD-10-CM | POA: Diagnosis not present

## 2015-10-13 DIAGNOSIS — M25611 Stiffness of right shoulder, not elsewhere classified: Secondary | ICD-10-CM | POA: Diagnosis not present
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
## 2015-10-15 ENCOUNTER — Ambulatory Visit (INDEPENDENT_AMBULATORY_CARE_PROVIDER_SITE_OTHER): Payer: Medicare Other | Admitting: Family Medicine

## 2015-10-15 ENCOUNTER — Ambulatory Visit: Payer: Medicare Other | Admitting: Physical Therapy

## 2015-10-15 ENCOUNTER — Encounter: Payer: Self-pay | Admitting: Physical Therapy

## 2015-10-15 VITALS — BP 126/82 | HR 90 | Temp 98.0°F | Resp 18 | Ht 64.0 in | Wt 214.0 lb

## 2015-10-15 DIAGNOSIS — I890 Lymphedema, not elsewhere classified: Secondary | ICD-10-CM

## 2015-10-15 DIAGNOSIS — M542 Cervicalgia: Secondary | ICD-10-CM

## 2015-10-15 DIAGNOSIS — R0789 Other chest pain: Secondary | ICD-10-CM

## 2015-10-15 DIAGNOSIS — M25611 Stiffness of right shoulder, not elsewhere classified: Secondary | ICD-10-CM | POA: Diagnosis not present

## 2015-10-15 DIAGNOSIS — W5503XA Scratched by cat, initial encounter: Secondary | ICD-10-CM

## 2015-10-15 DIAGNOSIS — H578 Other specified disorders of eye and adnexa: Secondary | ICD-10-CM | POA: Diagnosis not present

## 2015-10-15 DIAGNOSIS — M791 Myalgia, unspecified site: Secondary | ICD-10-CM

## 2015-10-15 DIAGNOSIS — H5789 Other specified disorders of eye and adnexa: Secondary | ICD-10-CM

## 2015-10-15 DIAGNOSIS — M545 Low back pain: Secondary | ICD-10-CM | POA: Diagnosis not present

## 2015-10-15 DIAGNOSIS — M79601 Pain in right arm: Secondary | ICD-10-CM | POA: Diagnosis not present
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
## 2015-10-20 ENCOUNTER — Ambulatory Visit (INDEPENDENT_AMBULATORY_CARE_PROVIDER_SITE_OTHER): Payer: Medicare Other | Admitting: Family Medicine

## 2015-10-20 ENCOUNTER — Ambulatory Visit: Payer: Medicare Other | Admitting: Physical Therapy

## 2015-10-20 VITALS — BP 122/70 | HR 78 | Temp 97.7°F | Resp 18 | Ht 64.0 in | Wt 213.0 lb

## 2015-10-20 DIAGNOSIS — M545 Low back pain, unspecified: Secondary | ICD-10-CM

## 2015-10-20 DIAGNOSIS — M542 Cervicalgia: Secondary | ICD-10-CM | POA: Diagnosis not present

## 2015-10-20 MED ORDER — HYDROCODONE-ACETAMINOPHEN 5-325 MG PO TABS: 1.0000 | ORAL_TABLET | Freq: Four times a day (QID) | ORAL | Status: DC | PRN

## 2015-10-20 NOTE — Addendum Note (Signed)
Addended by: [name] on: 10/20/2015 03:59 PM   Modules accepted: Orders

## 2015-10-20 NOTE — Patient Instructions (Addendum)
Please return on June 14 after 2 pm I am referring you to Dr. Belia Heman for physical therapy If you have problems in the meantime, Dr. Janeann Forehand should be able to fill in for me.
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
## 2015-10-22 ENCOUNTER — Ambulatory Visit: Payer: Medicare Other | Admitting: Physical Therapy

## 2015-10-22 ENCOUNTER — Other Ambulatory Visit (HOSPITAL_BASED_OUTPATIENT_CLINIC_OR_DEPARTMENT_OTHER): Payer: Medicare Other

## 2015-10-22 DIAGNOSIS — C50411 Malignant neoplasm of upper-outer quadrant of right female breast: Secondary | ICD-10-CM

## 2015-10-22 DIAGNOSIS — I82403 Acute embolism and thrombosis of unspecified deep veins of lower extremity, bilateral: Secondary | ICD-10-CM

## 2015-10-22 DIAGNOSIS — I82402 Acute embolism and thrombosis of unspecified deep veins of left lower extremity: Secondary | ICD-10-CM

## 2015-10-22 LAB — PROTIME-INR
INR: 3 (ref 2.00–3.50)
Protime: 36 Seconds — ABNORMAL HIGH (ref 10.6–13.4)

## 2015-11-11 ENCOUNTER — Ambulatory Visit (INDEPENDENT_AMBULATORY_CARE_PROVIDER_SITE_OTHER): Payer: Medicare Other | Admitting: Family Medicine

## 2015-11-11 VITALS — BP 144/78 | HR 77 | Temp 97.5°F | Resp 16 | Ht 64.0 in | Wt 213.6 lb

## 2015-11-11 DIAGNOSIS — M542 Cervicalgia: Secondary | ICD-10-CM

## 2015-11-11 DIAGNOSIS — M545 Low back pain, unspecified: Secondary | ICD-10-CM

## 2015-11-11 MED ORDER — HYDROCODONE-ACETAMINOPHEN 5-325 MG PO TABS: 1.0000 | ORAL_TABLET | Freq: Four times a day (QID) | ORAL | Status: DC | PRN

## 2015-11-11 NOTE — Patient Instructions (Addendum)
Let me see you again at the end of August.     IF you received an x-ray today, you will receive an invoice from [facility]. Please contact [facility] at [phone] with questions or concerns regarding your invoice.   IF you received labwork today, you will receive an invoice from [employer]. Please contact [facility] at [phone] with questions or concerns regarding your invoice.   Our billing staff will not be able to assist you with questions regarding bills from these companies.  You will be contacted with the lab results as soon as they are available. The fastest way to get your results is to activate your My Chart account. Instructions are located on the last page of this paperwork. If you have not heard from [name] regarding the results in 2 weeks, please contact this office.
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
## 2015-11-12 DIAGNOSIS — D485 Neoplasm of uncertain behavior of skin: Secondary | ICD-10-CM | POA: Diagnosis not present

## 2015-11-19 ENCOUNTER — Other Ambulatory Visit (HOSPITAL_BASED_OUTPATIENT_CLINIC_OR_DEPARTMENT_OTHER): Payer: Medicare Other

## 2015-11-19 ENCOUNTER — Encounter: Payer: Self-pay | Admitting: Oncology

## 2015-11-19 DIAGNOSIS — E213 Hyperparathyroidism, unspecified: Secondary | ICD-10-CM

## 2015-11-19 DIAGNOSIS — I82402 Acute embolism and thrombosis of unspecified deep veins of left lower extremity: Secondary | ICD-10-CM

## 2015-11-19 DIAGNOSIS — I82403 Acute embolism and thrombosis of unspecified deep veins of lower extremity, bilateral: Secondary | ICD-10-CM

## 2015-11-19 DIAGNOSIS — C50411 Malignant neoplasm of upper-outer quadrant of right female breast: Secondary | ICD-10-CM

## 2015-11-19 LAB — COMPREHENSIVE METABOLIC PANEL
ALBUMIN: 3.8 g/dL (ref 3.5–5.0)
ALT: 22 U/L (ref 0–55)
AST: 16 U/L (ref 5–34)
Alkaline Phosphatase: 79 U/L (ref 40–150)
Anion Gap: 6 mEq/L (ref 3–11)
BILIRUBIN TOTAL: 0.44 mg/dL (ref 0.20–1.20)
BUN: 14 mg/dL (ref 7.0–26.0)
CHLORIDE: 108 meq/L (ref 98–109)
CO2: 27 meq/L (ref 22–29)
Calcium: 10.3 mg/dL (ref 8.4–10.4)
Creatinine: 0.8 mg/dL (ref 0.6–1.1)
EGFR: 75 mL/min/{1.73_m2} — AB (ref >90)
GLUCOSE: 90 mg/dL (ref 70–140)
POTASSIUM: 4.3 meq/L (ref 3.5–5.1)
Sodium: 140 mEq/L (ref 136–145)
TOTAL PROTEIN: 6.9 g/dL (ref 6.4–8.3)

## 2015-11-19 LAB — CBC WITH DIFFERENTIAL/PLATELET
BASO%: 1.2 % (ref 0.0–2.0)
BASOS ABS: 0.1 10*3/uL (ref 0.0–0.1)
EOS%: 1.7 % (ref 0.0–7.0)
Eosinophils Absolute: 0.1 10*3/uL (ref 0.0–0.5)
HCT: 41.5 % (ref 34.8–46.6)
HGB: 13.9 g/dL (ref 11.6–15.9)
LYMPH%: 28.1 % (ref 14.0–49.7)
MCH: 30.1 pg (ref 25.1–34.0)
MCHC: 33.4 g/dL (ref 31.5–36.0)
MCV: 90.1 fL (ref 79.5–101.0)
MONO#: 0.9 10*3/uL (ref 0.1–0.9)
MONO%: 16.7 % — ABNORMAL HIGH (ref 0.0–14.0)
NEUT#: 2.9 10*3/uL (ref 1.5–6.5)
NEUT%: 52.3 % (ref 38.4–76.8)
PLATELETS: 176 10*3/uL (ref 145–400)
RBC: 4.61 10*6/uL (ref 3.70–5.45)
RDW: 14.4 % (ref 11.2–14.5)
WBC: 5.5 10*3/uL (ref 3.9–10.3)
lymph#: 1.5 10*3/uL (ref 0.9–3.3)

## 2015-11-19 LAB — PROTIME-INR
INR: 2.2 (ref 2.00–3.50)
Protime: 26.4 Seconds — ABNORMAL HIGH (ref 10.6–13.4)
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
## 2015-11-20 ENCOUNTER — Telehealth: Payer: Self-pay | Admitting: *Deleted

## 2015-11-20 ENCOUNTER — Other Ambulatory Visit: Payer: Self-pay | Admitting: *Deleted

## 2015-11-20 DIAGNOSIS — M542 Cervicalgia: Secondary | ICD-10-CM

## 2015-11-20 DIAGNOSIS — C50411 Malignant neoplasm of upper-outer quadrant of right female breast: Secondary | ICD-10-CM

## 2015-11-20 DIAGNOSIS — R600 Localized edema: Secondary | ICD-10-CM

## 2015-11-20 DIAGNOSIS — M5489 Other dorsalgia: Secondary | ICD-10-CM
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
## 2015-11-23 ENCOUNTER — Other Ambulatory Visit: Payer: Self-pay | Admitting: Ophthalmology

## 2015-11-23 DIAGNOSIS — D2211 Melanocytic nevi of right eyelid, including canthus: Secondary | ICD-10-CM | POA: Diagnosis not present

## 2015-11-23 DIAGNOSIS — H04121 Dry eye syndrome of right lacrimal gland: Secondary | ICD-10-CM | POA: Diagnosis not present

## 2015-12-07 ENCOUNTER — Ambulatory Visit (INDEPENDENT_AMBULATORY_CARE_PROVIDER_SITE_OTHER): Payer: Medicare Other | Admitting: Internal Medicine

## 2015-12-07 ENCOUNTER — Telehealth: Payer: Self-pay | Admitting: Internal Medicine

## 2015-12-07 ENCOUNTER — Encounter: Payer: Self-pay | Admitting: Internal Medicine

## 2015-12-07 VITALS — BP 142/82 | HR 82 | Wt 212.0 lb

## 2015-12-07 DIAGNOSIS — E038 Other specified hypothyroidism: Secondary | ICD-10-CM

## 2015-12-07 DIAGNOSIS — E213 Hyperparathyroidism, unspecified: Secondary | ICD-10-CM

## 2015-12-07 DIAGNOSIS — E559 Vitamin D deficiency, unspecified: Secondary | ICD-10-CM | POA: Diagnosis not present

## 2015-12-07 LAB — TSH: TSH: 0.87 u[IU]/mL (ref 0.35–4.50)

## 2015-12-07 LAB — T4, FREE: Free T4: 1.11 ng/dL (ref 0.60–1.60)

## 2015-12-07 NOTE — Patient Instructions (Signed)
Please stop at the lab.  Please continue Vitamin D 5000 units daily.  Please return in 1 year.

## 2015-12-07 NOTE — Telephone Encounter (Signed)
Pt arrived 12 min late for her 3:30 appt today. I informed her of the process and policy, she was unhappy with the policy as she did not want to wait to be checked in. Pt was not denied care and was checked in.

## 2015-12-07 NOTE — Progress Notes (Signed)
Patient ID: Alicia Vasquez, female   DOB: [birth year], 75 y.o.   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 Last visit 6 mo ago.  Reviewed and addended hx: Pt was dx with found to have an elevated parathyroid hormone in 02/2014. She had 1x high calcium level before her breast surgery: 11 (I could not find this in the records).  A recent calcium was again slightly high, at 10.6 (8.4-10.4).  I reviewed pt's pertinent labs: Lab Results  Component Value Date   PTH 37 06/09/2015   PTH Comment 06/09/2015   PTH 37 12/19/2014   PTH Comment 12/19/2014   PTH 88* 07/27/2014   PTH 76* 07/10/2014   PTH 75* 06/07/2014   PTH 44 04/18/2014   PTH Comment 04/18/2014   PTH 99* 03/17/2014   CALCIUM 10.3 11/19/2015   CALCIUM 10.4 09/17/2015   CALCIUM 10.2 08/19/2015   CALCIUM 10.5* 07/23/2015   CALCIUM 10.5* 06/18/2015   CALCIUM 10.5* 06/09/2015   CALCIUM 10.6* 04/22/2015   CALCIUM 10.1 02/19/2015   CALCIUM 10.4 02/05/2015   CALCIUM 10.1 12/19/2014   Previous vitamin D was very low - PTH normalized after normalization of her vitamin D levels, but she continued to have slightly elevated calcium, up to 07/2015, after which calcium normalized, also.  Pt was taking 1000 IU vitamin D in the past - a level was very low then >> we increase the dose to 5000 units daily >> vit D normalized. She continues this today.   Lab Results  Component Value Date   VD25OH 45.83 06/09/2015   VD25OH 30.58 12/19/2014   VD25OH 26.82* 10/17/2014   VD25OH 7.19* 04/18/2014   Component     Latest Ref Rng 04/18/2014         3:04 PM  Vitamin D 1, 25 (OH) Total     18 - 72 pg/mL 55  Vitamin D3 1, 25 (OH)      55  Vitamin D2 1, 25 (OH)      <8  Magnesium     1.5 - 2.5 mg/dL 2.1  Phosphorus     2.3 - 4.6 mg/dL 2.8   Pt had normal calcium levels but elevated PTH when vit D level was low, then PTH  normalized with normalization of her vitamin D: Component     Latest Ref Rng 12/19/2014         3:07 PM  Calcium     8.7 - 10.3 mg/dL 10.1  PTH     15 - 65 pg/mL 37   Component     Latest Ref Rng 06/09/2015         4:13 PM  Calcium     8.7 - 10.3 mg/dL 10.5 (H)  PTH     15 - 65 pg/mL 37   + previous DEXA scans - last 09/03/2014: no osteoporosis/but osteopenia at L FN (-1.6) She had 1 fracture: - 1994: R foot  - 1990's: L hairline fracture   No h/o kidney stones.  No h/o CKD. Last BUN/Cr: Lab Results  Component Value Date   BUN 14.0 11/19/2015   CREATININE 0.8 11/19/2015   She also has a history of breast cancer and is on Arimidex. She had 2 sx this Spring. She had RxTx. No ChTx. She also had a L leg in 2013.   Hypothyroidism: She is taking LT4 125 mcg: - fasting - 15-20 min  later: she eats b'fast - no PPI, Ca, Fe, MVI  Last TSH: Lab Results  Component Value Date   TSH 1.81 01/19/2015    ROS: Constitutional: no weight gain, no fatigue, no hot flushes Eyes: no blurry vision, no xerophthalmia ENT: no sore throat, no nodules palpated in throat, no dysphagia/odynophagia, +  hoarseness Cardiovascular: no CP/no SOB/no palpitations/no leg swelling Respiratory: no cough/SOB Gastrointestinal: no N/V/D/C, no heartburn Musculoskeletal: + muscle aches/+ joint aches Skin: no rashes Neurological: no tremors/numbness/tingling/dizziness, no HA  I reviewed pt's medications, allergies, PMH, social hx, family hx, and changes were documented in the history of present illness. Otherwise, unchanged from my initial visit note.   Past Medical History  Diagnosis Date  . Allergy   . Hypertension   . Hypothyroid   . Blood transfusion without reported diagnosis   . Pneumonia   . DVT (deep vein thrombosis) in pregnancy   . Breast cancer ([place]) 07/12/13    invasive mammary carcinoma  . Anxiety   . Arthritis   . Radiation 11/21/13-01/07/14    Right Breast/supraclav.   Past  Surgical History  Procedure Laterality Date  . Removal of teeth  04/19/2012    13 teeth removed  . Biospy      female organs  . Dilation and curettage of uterus    . Breast lumpectomy with radioactive seed localization Right 09/09/2013    Procedure: BREAST LUMPECTOMY WITH RADIOACTIVE SEED LOCALIZATION WITH AXILLARY NODE EXCISION;  Surgeon: [name], MD;  Location: [place];  Service: General;  Laterality: Right;  . Re-excision of breast lumpectomy Right 09/24/2013    Procedure: RE-EXCISION OF RIGHT BREAST LUMPECTOMY;  Surgeon: [name], MD;  Location: [place];  Service: General;  Laterality: Right;   History   Social History  . Marital Status: Widowed    Spouse Name: N/A    Number of Children: 1   Occupational History  . Retired Radio broadcast assistant   Social History Main Topics  . Smoking status: Never Smoker   . Smokeless tobacco: Not on file  . Alcohol Use: No  . Drug Use: No   Social History Narrative   Exercise yard work   Current Outpatient Prescriptions on File Prior to Visit  Medication Sig Dispense Refill  . acetaminophen (TYLENOL) 500 MG tablet Take 500 mg by mouth every 6 (six) hours as needed for mild pain, fever or headache. Reported on 09/08/2015    . Cholecalciferol (VITAMIN D3) 5000 UNITS TABS Take by mouth.    . levothyroxine (SYNTHROID) 125 MCG tablet Take 1 tablet (125 mcg total) by mouth daily before breakfast. Dispense as written: Synthroid 90 tablet 3  . lisinopril (PRINIVIL,ZESTRIL) 10 MG tablet TAKE 1 TABLET (10 MG TOTAL) BY MOUTH DAILY. 90 tablet 1  . non-metallic deodorant (ALRA) MISC Apply 1 application topically daily. Reported on 08/25/2015    . warfarin (JANTOVEN) 5 MG tablet TAKE AS DIRECTED BY ANTICOAGULATION CLINIC (Patient taking differently: Take 7.5-10 mg by mouth at bedtime. MWF 7.5 mg (one and a half tablet and 2 tablets (10 mg ) on all other days.) 60 tablet 3  . HYDROcodone-acetaminophen (NORCO) 5-325  MG tablet Take 1 tablet by mouth every 6 (six) hours as needed for moderate pain. (Patient not taking: Reported on 12/07/2015) 30 tablet 0   No current facility-administered medications on file prior to visit.   Allergies  Allergen Reactions  . Anesthetics, Amide Other (See Comments)    ELEVATE BLOOD PRESSURE  . Benadryl [Diphenhydramine  Hcl]     dizziness  . Carbocaine [Mepivacaine Hcl] Other (See Comments)    Elevated blood pressure  . Codeine Nausea Only and Other (See Comments)    dizziness  . Epinephrine Other (See Comments)    Elevated blood pressure  . Sulfa Antibiotics Other (See Comments)    dizziness  . Vicodin [Hydrocodone-Acetaminophen] Nausea Only  . Penicillins Rash   Family History  Problem Relation Age of Onset  . Cancer Brother     prostate  . Heart disease Brother   . Colon cancer Brother    PE: BP 142/82 mmHg  Pulse 82  Wt 212 lb (96.163 kg)  SpO2 93% Body mass index is 36.37 kg/(m^2). Wt Readings from Last 3 Encounters:  12/07/15 212 lb (96.163 kg)  11/11/15 213 lb 9.6 oz (96.888 kg)  10/20/15 213 lb (96.616 kg)   Constitutional: overweight, in NAD. No kyphosis. Eyes: PERRLA, EOMI, no exophthalmos ENT: moist mucous membranes, no thyromegaly, no cervical lymphadenopathy Cardiovascular: RRR, No MRG Respiratory: CTA B Gastrointestinal: abdomen soft, NT, ND, BS+ Musculoskeletal: no deformities, strength intact in all 4 Skin: moist, warm, no rashes Neurological: no tremor with outstretched hands, DTR normal in all 4  Assessment: 1. Normocalcemic hyperparathyroidism  2. H/o Vitamin D deficiency - Severe    3. hypothyroidism  Plan: 1. Hyperparathyroidism  Patient has had normal calcium levels, and an occasional high Calcium level, latest normal x 3 since 07/2015.  An intact PTH level was also high, but normalized after vit D normalized on vit D3 5000 units daily. She continues on this dose.  - No apparent complications from hypercalcemia: no h/o  nephrolithiasis, no osteoporosis, had 1 traumatic fracture. No abdominal pain, depression, bone pain. - we discussed that the occasionally slightly elevated calcium can just be monitored, but I do not feel she would greatly benefit from further investigation and a possible parathyroid sx. She agrees with the plan. - will check: Orders Placed This Encounter  Procedures  . T4, free  . TSH  . PTH, intact and calcium  . VITAMIN D 25 Hydroxy (Vit-D Deficiency, Fractures)  - I will see the patient back in 1 year  2. H/o Severe vitamin D deficiency - improving - She is on 5000 units of vitamin D daily, which we will continue today  - Check vitamin D level today   3. Hypothyroidism - on LT4 125 daily - advised her to take the thyroid hormone every day, with water, at least 30 minutes before breakfast, separated by at least 4 hours from: - acid reflux medications - calcium - iron - multivitamins - recheck TFTs today. Last TSh normal 12/2014  Office Visit on 12/07/2015  Component Date Value Ref Range Status  . Free T4 12/07/2015 1.11  0.60 - 1.60 ng/dL Final  . TSH 12/07/2015 0.87  0.35 - 4.50 uIU/mL Final  . Calcium 12/07/2015 10.1  8.7 - 10.3 mg/dL Final  . PTH 12/07/2015 31  15 - 65 pg/mL Final  . PTH 12/07/2015 Comment   Final   Comment: Interpretation                 Intact PTH    Calcium                                 (pg/mL)      (mg/dL) Normal  15 - 65     8.6 - 10.2 Primary Hyperparathyroidism         >65          >10.2 Secondary Hyperparathyroidism       >65          <10.2 Non-Parathyroid Hypercalcemia       <65          >10.2 Hypoparathyroidism                  <15          < 8.6 Non-Parathyroid Hypocalcemia    15 - 65          < 8.6   . VITD 12/07/2015 47.68  30.00 - 100.00 ng/mL Final   All labs normal.

## 2015-12-08 ENCOUNTER — Ambulatory Visit: Payer: Medicare Other | Attending: Oncology | Admitting: Physical Therapy

## 2015-12-08 ENCOUNTER — Telehealth: Payer: Self-pay

## 2015-12-08 DIAGNOSIS — M25611 Stiffness of right shoulder, not elsewhere classified: Secondary | ICD-10-CM

## 2015-12-08 DIAGNOSIS — I890 Lymphedema, not elsewhere classified: Secondary | ICD-10-CM

## 2015-12-08 DIAGNOSIS — M79601 Pain in right arm: Secondary | ICD-10-CM | POA: Diagnosis not present

## 2015-12-08 DIAGNOSIS — R5381 Other malaise: Secondary | ICD-10-CM | POA: Diagnosis not present

## 2015-12-08 LAB — VITAMIN D 25 HYDROXY (VIT D DEFICIENCY, FRACTURES): VITD: 47.68 ng/mL (ref 30.00–100.00)

## 2015-12-08 LAB — PTH, INTACT AND CALCIUM
CALCIUM: 10.1 mg/dL (ref 8.7–10.3)
PTH: 31 pg/mL (ref 15–65)

## 2015-12-08 NOTE — Telephone Encounter (Signed)
Called to give patient normal lab results. Patient requested lab results be mailed to her address. No other questions or concerns.

## 2015-12-08 NOTE — Therapy (Signed)
[place], [name], [address] Phone: [phone]   Fax:  [phone]  Physical Therapy Evaluation  Patient Details  Name: Alicia Vasquez MRN: [id number] Date of Birth: 1940/12/23 Referring Provider: Dr.Magrinat  Encounter Date: 12/08/2015      PT End of Session - 12/08/15 1628    Visit Number 1   Number of Visits 9   Date for PT Re-Evaluation 10/15/15   PT Start Time [id number]   PT Stop Time 1604   PT Time Calculation (min) 47 min   Activity Tolerance Patient tolerated treatment well   Behavior During Therapy [facility] for tasks assessed/performed      Past Medical History  Diagnosis Date  . Allergy   . Hypertension   . Hypothyroid   . Blood transfusion without reported diagnosis   . Pneumonia   . DVT (deep vein thrombosis) in pregnancy   . Breast cancer ([place]) 07/12/13    invasive mammary carcinoma  . Anxiety   . Arthritis   . Radiation 11/21/13-01/07/14    Right Breast/supraclav.    Past Surgical History  Procedure Laterality Date  . Removal of teeth  04/19/2012    13 teeth removed  . Biospy      female organs  . Dilation and curettage of uterus    . Breast lumpectomy with radioactive seed localization Right 09/09/2013    Procedure: BREAST LUMPECTOMY WITH RADIOACTIVE SEED LOCALIZATION WITH AXILLARY NODE EXCISION;  Surgeon: [name], MD;  Location: [place];  Service: General;  Laterality: Right;  . Re-excision of breast lumpectomy Right 09/24/2013    Procedure: RE-EXCISION OF RIGHT BREAST LUMPECTOMY;  Surgeon: [name], MD;  Location: [place];  Service: General;  Laterality: Right;    There were no vitals filed for this visit.       Subjective Assessment - 12/08/15 1528    Subjective my arm is extremely swollen. She tried to go get a sleeve and was not able to tolerate it .Marland Kitchen... too painful   Pertinent History breast cancer in 2015 with  lumpectomy and radiation.  Multiple complaints of pain in back, legs, neck shoulder and arm. Pt reports she has not followed up with community exercise progra    Patient Stated Goals to get help with pain and swelling    Currently in Pain? Yes   Pain Score 8    Pain Location Arm   Pain Orientation Left   Pain Descriptors / Indicators Aching;Numbness  numbness in fingers    Pain Type Chronic pain   Pain Radiating Towards into neck and shoulder    Pain Onset More than a month ago   Pain Frequency Constant   Aggravating Factors  with activity    Pain Relieving Factors tylenol             [facility] PT Assessment - 12/08/15 0001    Assessment   Medical Diagnosis breast cancer    Referring Provider Dr.Magrinat   Onset Date/Surgical Date 09/09/13   Hand Dominance Right   Prior Therapy yes prolonged episode of PT in 2016 for exercise and pain control    Precautions   Precautions Other (comment)   Precaution Comments cancer with previous radiation   Restrictions   Weight Bearing Restrictions No   Balance Screen   Has the patient fallen in the past 6 months No   Has the patient had a decrease in activity level because of a fear of falling?  No   Is the patient reluctant to leave their home because of a fear of falling?  No   Home Environment   Living Environment Private residence   Living Arrangements Non-relatives/Friends   Available Help at Discharge Available PRN/intermittently   Prior Function   Level of Independence Independent   Cognition   Overall Cognitive Status Within Functional Limits for tasks assessed   Observation/Other Assessments   Observations dry skin and patches at elbow, healing cat scratches on right arm    Other Surveys  --   Sensation   Light Touch Not tested   Coordination   Gross Motor Movements are Fluid and Coordinated Not tested  slow but functional    Posture/Postural Control   Posture/Postural Control Postural limitations   Postural Limitations  Rounded Shoulders;Forward head   AROM   Right Shoulder Flexion --   Right Shoulder ABduction --   Right Shoulder External Rotation --   Left Shoulder Flexion --   Left Shoulder ABduction --   Left Shoulder External Rotation --   Cervical Flexion --   Cervical Extension --   Cervical - Right Side Bend --   Cervical - Left Side Bend --   Strength   Overall Strength Due to pain   Palpation   Palpation comment --           LYMPHEDEMA/ONCOLOGY QUESTIONNAIRE - 12/08/15 1534    Right Upper Extremity Lymphedema   15 cm Proximal to Olecranon Process 36 cm   10 cm Proximal to Olecranon Process 35 cm   Olecranon Process 29 cm   15 cm Proximal to Ulnar Styloid Process 27.5 cm   10 cm Proximal to Ulnar Styloid Process 24 cm   Just Proximal to Ulnar Styloid Process 17 cm   Across Hand at [employer] 19.1 cm   At [place] of 2nd Digit 6.1 cm   Left Upper Extremity Lymphedema   15 cm Proximal to Olecranon Process 34 cm   10 cm Proximal to Olecranon Process 32 cm   Olecranon Process 25.5 cm   15 cm Proximal to Ulnar Styloid Process 24.5 cm   10 cm Proximal to Ulnar Styloid Process 22.2 cm   Just Proximal to Ulnar Styloid Process 15.6 cm   Across Hand at [employer] 18.7 cm   At [place] of 2nd Digit 6 cm                [facility] Adult PT Treatment/Exercise - 12/08/15 0001    Manual Therapy   Compression Bandaging Biotone applied to arm, then thick stockinette, small at lower arm and medium on upper arm to axilla , elastomull to fingers 1-4 artiflex  1 6-cm, 1 8cm and 1 10 cm short stretch bandange                 PT Education - 12/08/15 1627    Education provided Yes   Education Details remove bandages if painful  and put all in a bag and bring you to next treatment    Person(s) Educated Patient   Methods Explanation   Comprehension Verbalized understanding                [facility] Goals - 12/08/15 1636    CC Long Term Goal  #1   Title Pt will  have a reduction in right forearm at 15 cm above the ulnar styloid by 1 cm   Baseline 27.5 on 12/08/2015   Time 4  Status New   CC Long Term Goal  #2   Title Pt will have/know how to get  a compression garment that she is able to use to control her lymphedema    Time 4   Period Weeks   Status New            Plan - 12/08/15 1629    Clinical Impression Statement Pt returns with continued swelling in right arm. She attempted to get a compression sleeve, but was not able to tolerate it when it was placed on her arm. She had too much pain and was told that she will need to get a custom fit sleeve. Pt right arm is greater than left in circumfrene. If she is not able to tolerate a compression sleeve, she may need to get a bandaging alterntive type of garment that she can take on and off easily by herself with less trauma to shoulder.    Rehab Potential Good   Clinical Impairments Affecting Rehab Potential previous radiation, chronic pain limiting her activities    PT Frequency 2x / week   PT Duration 4 weeks   PT Treatment/Interventions ADLs/Self Care Home Management;Manual lymph drainage;Compression bandaging;DME Instruction;Patient/family education;Therapeutic activities;Therapeutic exercise;Manual techniques   PT Next Visit Plan remeasure.  assess if pt is ready for juxtafit/farrow wrap, manual lymph draiange and compression bandaging if indicated    Consulted and Agree with Plan of Care Patient      Patient will benefit from skilled therapeutic intervention in order to improve the following deficits and impairments:  Increased fascial restricitons, Impaired UE functional use, Increased edema  Visit Diagnosis: Lymphedema, not elsewhere classified - Plan: PT plan of care cert/re-cert  Pain In Right Arm - Plan: PT plan of care cert/re-cert  Stiffness of right shoulder, not elsewhere classified - Plan: PT plan of care cert/re-cert      G-Codes - [id number]    Functional Assessment  Tool Used clinical judgement   Functional Limitation Self care   Self Care Current Status [id number]) At least 40 percent but less than 60 percent impaired, limited or restricted   Self Care Goal Status [id number]) At least 20 percent but less than 40 percent impaired, limited or restricted       Problem List Patient Active Problem List   Diagnosis Date Noted  . Lymphedema 09/17/2015  . Long term current use of anticoagulant therapy 08/23/2015  . DVT, lower extremity ([place]) 06/18/2015  . Bilateral knee pain 04/03/2015  . Arm edema 08/28/2014  . Vitamin D deficiency 04/23/2014  . Hyperparathyroidism ([place]) 04/23/2014  . Depression 07/18/2013  . Breast cancer of upper-outer quadrant of right female breast ([place]) 07/15/2013  . Encounter for therapeutic drug monitoring 07/05/2013  . Overactive bladder 01/30/2013  . DVT (deep venous thrombosis), left 12/19/2012  . HTN (hypertension) 09/27/2011  . Hearing loss 09/27/2011  . Chest wall pain 09/27/2011  . Seasonal allergies 09/27/2011  . SOB (shortness of breath), related to deconditioning 08/26/2011  . Hypothyroid 08/26/2011   [name]. Owens Shark PT  [name] 12/08/2015, 4:43 PM  [place], [name], [address] Phone: [phone]   Fax:  [phone]  Name: AZARRIA YTURRALDE MRN: [id number] Date of Birth: 1940-08-09

## 2015-12-10 ENCOUNTER — Ambulatory Visit: Payer: Medicare Other | Admitting: Physical Therapy

## 2015-12-10 DIAGNOSIS — M79601 Pain in right arm: Secondary | ICD-10-CM | POA: Diagnosis not present

## 2015-12-10 DIAGNOSIS — M25611 Stiffness of right shoulder, not elsewhere classified: Secondary | ICD-10-CM

## 2015-12-10 DIAGNOSIS — R5381 Other malaise: Secondary | ICD-10-CM | POA: Diagnosis not present

## 2015-12-10 DIAGNOSIS — I890 Lymphedema, not elsewhere classified: Secondary | ICD-10-CM

## 2015-12-10 NOTE — Therapy (Signed)
[place], [name], [address] Phone: [phone]   Fax:  [phone]  Physical Therapy Treatment  Patient Details  Name: Alicia Vasquez MRN: [id number] Date of Birth: 30-Jan-1941 Referring Provider: Dr.Magrinat  Encounter Date: 12/10/2015      PT End of Session - 12/10/15 1816    Visit Number 2   Number of Visits 9   Date for PT Re-Evaluation 01/08/16   PT Start Time 1315   PT Stop Time 1355   PT Time Calculation (min) 40 min   Activity Tolerance Patient tolerated treatment well   Behavior During Therapy [facility] for tasks assessed/performed      Past Medical History  Diagnosis Date  . Allergy   . Hypertension   . Hypothyroid   . Blood transfusion without reported diagnosis   . Pneumonia   . DVT (deep vein thrombosis) in pregnancy   . Breast cancer ([place]) 07/12/13    invasive mammary carcinoma  . Anxiety   . Arthritis   . Radiation 11/21/13-01/07/14    Right Breast/supraclav.    Past Surgical History  Procedure Laterality Date  . Removal of teeth  04/19/2012    13 teeth removed  . Biospy      female organs  . Dilation and curettage of uterus    . Breast lumpectomy with radioactive seed localization Right 09/09/2013    Procedure: BREAST LUMPECTOMY WITH RADIOACTIVE SEED LOCALIZATION WITH AXILLARY NODE EXCISION;  Surgeon: [name], MD;  Location: [place];  Service: General;  Laterality: Right;  . Re-excision of breast lumpectomy Right 09/24/2013    Procedure: RE-EXCISION OF RIGHT BREAST LUMPECTOMY;  Surgeon: [name], MD;  Location: [place];  Service: General;  Laterality: Right;    There were no vitals filed for this visit.      Subjective Assessment - 12/10/15 1320    Subjective pt comes in without bandages.  She took them off yesterday afternoon because bandages were too hot and she had pain in her forearm. She states that her arm looked  better after she took the bandages off. The pain relieved a little bit once she took the bandages off.    Currently in Pain? Yes   Pain Score 7    Pain Location Arm               LYMPHEDEMA/ONCOLOGY QUESTIONNAIRE - 12/10/15 1323    Right Upper Extremity Lymphedema   15 cm Proximal to Olecranon Process 36.5 cm   10 cm Proximal to Olecranon Process 34.5 cm   Olecranon Process 28.5 cm   15 cm Proximal to Ulnar Styloid Process 27.5 cm   10 cm Proximal to Ulnar Styloid Process 24.2 cm   Just Proximal to Ulnar Styloid Process 17 cm   Across Hand at [employer] 19.3 cm   At [place] of 2nd Digit --                  [facility] Adult PT Treatment/Exercise - 12/10/15 0001    Self-Care   Other Self-Care Comments  Measured for farrow wrap off the shelf arm sleeve and gauntlet with information about birghtlift direct or lymphedema products that she could order on the internet or call.  She will need a right large regular armsleeve and a small gauntlet .pt acknowledged understanding                  PT Education - 12/10/15 1816  Education provided Yes   Education Details information with choices about where and what size to get for armsleeve and gauntlet    Person(s) Educated Patient   Methods Explanation;Demonstration;Handout   Comprehension Verbalized understanding                [place] - 12/08/15 1636    CC Long Term Goal  #1   Title Pt will have a reduction in right forearm at 15 cm above the ulnar styloid by 1 cm   Baseline 27.5 on 12/08/2015   Time 4   Status New   CC Long Term Goal  #2   Title Pt will have/know how to get  a compression garment that she is able to use to control her lymphedema    Time 4   Period Weeks   Status New            Plan - 12/10/15 1817    Clinical Impression Statement Pt is not able to tolerated bandages or compression sleeve.  Measured her for off the shelf farrow wrap bandaging alternative sleeve  and gaunlet so that she will be able to easily get it on and off and control her swelling as she tolerates   Clinical Impairments Affecting Rehab Potential previous radiation, chronic pain limiting her activities    PT Next Visit Plan  manual lymph draiange , range of motion, assess if she ordered the garments    Consulted and Agree with Plan of Care Patient      Patient will benefit from skilled therapeutic intervention in order to improve the following deficits and impairments:  Increased fascial restricitons, Impaired UE functional use, Increased edema  Visit Diagnosis: Lymphedema, not elsewhere classified  Pain In Right Arm  Stiffness of right shoulder, not elsewhere classified     Problem List Patient Active Problem List   Diagnosis Date Noted  . Lymphedema 09/17/2015  . Long term current use of anticoagulant therapy 08/23/2015  . DVT, lower extremity ([place]) 06/18/2015  . Bilateral knee pain 04/03/2015  . Arm edema 08/28/2014  . Vitamin D deficiency 04/23/2014  . Hyperparathyroidism ([place]) 04/23/2014  . Depression 07/18/2013  . Breast cancer of upper-outer quadrant of right female breast ([place]) 07/15/2013  . Encounter for therapeutic drug monitoring 07/05/2013  . Overactive bladder 01/30/2013  . DVT (deep venous thrombosis), left 12/19/2012  . HTN (hypertension) 09/27/2011  . Hearing loss 09/27/2011  . Chest wall pain 09/27/2011  . Seasonal allergies 09/27/2011  . SOB (shortness of breath), related to deconditioning 08/26/2011  . Hypothyroid 08/26/2011   [name]. Owens Shark PT  [name] 12/10/2015, 6:20 PM  [place], [name], [address] Phone: [phone]   Fax:  [phone]  Name: Alicia Vasquez MRN: [id number] Date of Birth: [birth year]

## 2015-12-15 ENCOUNTER — Ambulatory Visit: Payer: Medicare Other | Admitting: Physical Therapy

## 2015-12-15 DIAGNOSIS — M79601 Pain in right arm: Secondary | ICD-10-CM

## 2015-12-15 DIAGNOSIS — M25611 Stiffness of right shoulder, not elsewhere classified: Secondary | ICD-10-CM

## 2015-12-15 DIAGNOSIS — R5381 Other malaise: Secondary | ICD-10-CM | POA: Diagnosis not present

## 2015-12-15 DIAGNOSIS — I890 Lymphedema, not elsewhere classified: Secondary | ICD-10-CM

## 2015-12-15 NOTE — Therapy (Signed)
[place], [name], [address] Phone: [phone]   Fax:  [phone]  Physical Therapy Treatment  Patient Details  Name: Alicia Vasquez MRN: [id number] Date of Birth: 11-18-1940 Referring Provider: Dr.Magrinat  Encounter Date: 12/15/2015      PT End of Session - 12/15/15 1740    Visit Number 3   Number of Visits 9   Date for PT Re-Evaluation 01/08/16   PT Start Time 1306   PT Stop Time 1345   PT Time Calculation (min) 39 min   Activity Tolerance Patient tolerated treatment well   Behavior During Therapy [facility] for tasks assessed/performed      Past Medical History  Diagnosis Date  . Allergy   . Hypertension   . Hypothyroid   . Blood transfusion without reported diagnosis   . Pneumonia   . DVT (deep vein thrombosis) in pregnancy   . Breast cancer ([place]) 07/12/13    invasive mammary carcinoma  . Anxiety   . Arthritis   . Radiation 11/21/13-01/07/14    Right Breast/supraclav.    Past Surgical History  Procedure Laterality Date  . Removal of teeth  04/19/2012    13 teeth removed  . Biospy      female organs  . Dilation and curettage of uterus    . Breast lumpectomy with radioactive seed localization Right 09/09/2013    Procedure: BREAST LUMPECTOMY WITH RADIOACTIVE SEED LOCALIZATION WITH AXILLARY NODE EXCISION;  Surgeon: [name], MD;  Location: [place];  Service: General;  Laterality: Right;  . Re-excision of breast lumpectomy Right 09/24/2013    Procedure: RE-EXCISION OF RIGHT BREAST LUMPECTOMY;  Surgeon: [name], MD;  Location: [place];  Service: General;  Laterality: Right;    There were no vitals filed for this visit.      Subjective Assessment - 12/15/15 1306    Subjective pt has started to investigate with ordering her farrow wrap.  [employer] direct takes 3-4 weeks and lymphedema products only sells to dealers. She will got to  [facility] to see what the prices is.    Pertinent History breast cancer in 2015 with lumpectomy and radiation.  Multiple complaints of pain in back, legs, neck shoulder and arm. Pt reports she has not followed up with community exercise progra    Patient Stated Goals to get help with pain and swelling    Currently in Pain? Yes   Pain Score 8   took tylenol    Pain Location Arm   Pain Orientation Right   Pain Descriptors / Indicators Aching;Tingling   Pain Type Chronic pain   Pain Radiating Towards into neck and shoulder    Pain Onset More than a month ago                         [facility] Adult PT Treatment/Exercise - 12/15/15 0001    Manual Therapy   Soft tissue mobilization to tender areas in right neck at area of first rib and upper trap and below axilla at lateral chest    Manual Lymphatic Drainage (MLD) In Supine: Short neck, superficial and deep abdominals, Lt axillary nodes, anterior inter-axillary anastomosis, and Rt UE from dorsal hand to lateral shoulder.                         [facility] Goals - 12/15/15 1743    CC Long  Term Goal  #1   Title Pt will have a reduction in right forearm at 15 cm above the ulnar styloid by 1 cm   Status On-going   CC Long Term Goal  #2   Title Pt will have/know how to get  a compression garment that she is able to use to control her lymphedema    Status Achieved            Plan - 12/15/15 1741    Clinical Impression Statement pt thinks her swelling is down, but she still has pain in neck and axilla.  Treatment today on soft tissue work to these areas    Clinical Impairments Affecting Rehab Potential previous radiation, chronic pain limiting her activities    PT Next Visit Plan  assess if she got benefit from soft tissue work, continue with manual lymph draiange , range of motion, assess if she ordered the garments    Consulted and Agree with Plan of Care Patient      Patient will benefit from  skilled therapeutic intervention in order to improve the following deficits and impairments:     Visit Diagnosis: Lymphedema, not elsewhere classified  Pain In Right Arm  Stiffness of right shoulder, not elsewhere classified  Physical deconditioning     Problem List Patient Active Problem List   Diagnosis Date Noted  . Lymphedema 09/17/2015  . Long term current use of anticoagulant therapy 08/23/2015  . DVT, lower extremity ([place]) 06/18/2015  . Bilateral knee pain 04/03/2015  . Arm edema 08/28/2014  . Vitamin D deficiency 04/23/2014  . Hyperparathyroidism ([place]) 04/23/2014  . Depression 07/18/2013  . Breast cancer of upper-outer quadrant of right female breast ([place]) 07/15/2013  . Encounter for therapeutic drug monitoring 07/05/2013  . Overactive bladder 01/30/2013  . DVT (deep venous thrombosis), left 12/19/2012  . HTN (hypertension) 09/27/2011  . Hearing loss 09/27/2011  . Chest wall pain 09/27/2011  . Seasonal allergies 09/27/2011  . SOB (shortness of breath), related to deconditioning 08/26/2011  . Hypothyroid 08/26/2011   [name]. Owens Shark PT  [name] 12/15/2015, 5:44 PM  [place], [name], [address] Phone: [phone]   Fax:  [phone]  Name: CLYDEAN GUNNELLS MRN: [id number] Date of Birth: 04-Aug-1940

## 2015-12-17 ENCOUNTER — Ambulatory Visit: Payer: Medicare Other | Admitting: Physical Therapy

## 2015-12-17 ENCOUNTER — Ambulatory Visit (HOSPITAL_BASED_OUTPATIENT_CLINIC_OR_DEPARTMENT_OTHER): Payer: Medicare Other | Admitting: Oncology

## 2015-12-17 ENCOUNTER — Telehealth: Payer: Self-pay | Admitting: Oncology

## 2015-12-17 ENCOUNTER — Other Ambulatory Visit (HOSPITAL_BASED_OUTPATIENT_CLINIC_OR_DEPARTMENT_OTHER): Payer: Medicare Other

## 2015-12-17 VITALS — BP 137/58 | HR 99 | Temp 98.3°F | Resp 18 | Ht 64.0 in | Wt 212.0 lb

## 2015-12-17 DIAGNOSIS — M25511 Pain in right shoulder: Secondary | ICD-10-CM

## 2015-12-17 DIAGNOSIS — I890 Lymphedema, not elsewhere classified: Secondary | ICD-10-CM | POA: Diagnosis not present

## 2015-12-17 DIAGNOSIS — M8580 Other specified disorders of bone density and structure, unspecified site: Secondary | ICD-10-CM

## 2015-12-17 DIAGNOSIS — E213 Hyperparathyroidism, unspecified: Secondary | ICD-10-CM

## 2015-12-17 DIAGNOSIS — I82403 Acute embolism and thrombosis of unspecified deep veins of lower extremity, bilateral: Secondary | ICD-10-CM | POA: Diagnosis present

## 2015-12-17 DIAGNOSIS — I82402 Acute embolism and thrombosis of unspecified deep veins of left lower extremity: Secondary | ICD-10-CM

## 2015-12-17 DIAGNOSIS — C50411 Malignant neoplasm of upper-outer quadrant of right female breast: Secondary | ICD-10-CM | POA: Diagnosis not present

## 2015-12-17 LAB — PROTIME-INR
INR: 1.6 — ABNORMAL LOW (ref 2.00–3.50)
Protime: 19.2 Seconds — ABNORMAL HIGH (ref 10.6–13.4)

## 2015-12-17 NOTE — Telephone Encounter (Signed)
Gave pt appts for July - Oct 2017.
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
## 2015-12-18 ENCOUNTER — Telehealth: Payer: Self-pay

## 2015-12-18 NOTE — Telephone Encounter (Signed)
fax req [facility] re: JANTOVEN 5MG  TABS #[id number]-sent to [place] pool

## 2015-12-18 NOTE — Telephone Encounter (Signed)
Who is managing this? I think it's Dr. Jana Hakim. If so, he should authorize this medication.

## 2015-12-22 ENCOUNTER — Other Ambulatory Visit: Payer: Self-pay

## 2015-12-22 ENCOUNTER — Ambulatory Visit: Payer: Medicare Other | Admitting: Physical Therapy

## 2015-12-22 DIAGNOSIS — R5381 Other malaise: Secondary | ICD-10-CM | POA: Diagnosis not present

## 2015-12-22 DIAGNOSIS — M25611 Stiffness of right shoulder, not elsewhere classified: Secondary | ICD-10-CM | POA: Diagnosis not present

## 2015-12-22 DIAGNOSIS — I82409 Acute embolism and thrombosis of unspecified deep veins of unspecified lower extremity: Secondary | ICD-10-CM

## 2015-12-22 DIAGNOSIS — I890 Lymphedema, not elsewhere classified: Secondary | ICD-10-CM

## 2015-12-22 DIAGNOSIS — M79601 Pain in right arm: Secondary | ICD-10-CM | POA: Diagnosis not present

## 2015-12-22 DIAGNOSIS — Z86711 Personal history of pulmonary embolism: Secondary | ICD-10-CM

## 2015-12-22 DIAGNOSIS — I82402 Acute embolism and thrombosis of unspecified deep veins of left lower extremity: Secondary | ICD-10-CM

## 2015-12-22 NOTE — Telephone Encounter (Signed)
Pharmacy is calling to put in a request for jantoven. They stated that they sent a request but I didn't notice it in the system.

## 2015-12-22 NOTE — Telephone Encounter (Signed)
It looks like Dr L has been Rxing this for pt. I'm not sure why she is not Rxd through her anticoagulation clinic? Do you want to RF?

## 2015-12-22 NOTE — Therapy (Signed)
[place], [name], [address] Phone: [phone]   Fax:  [phone]  Physical Therapy Treatment  Patient Details  Name: Alicia Vasquez MRN: [id number] Date of Birth: 1940/06/22 Referring Provider: Dr.Magrinat  Encounter Date: 12/22/2015      PT End of Session - 12/22/15 1726    Visit Number 4   Number of Visits 9   Date for PT Re-Evaluation 01/08/16   PT Start Time [id number]   PT Stop Time 1345   PT Time Calculation (min) 40 min   Activity Tolerance Patient tolerated treatment well   Behavior During Therapy [facility] for tasks assessed/performed      Past Medical History:  Diagnosis Date  . Allergy   . Anxiety   . Arthritis   . Blood transfusion without reported diagnosis   . Breast cancer ([place]) 07/12/13   invasive mammary carcinoma  . DVT (deep vein thrombosis) in pregnancy   . Hypertension   . Hypothyroid   . Pneumonia   . Radiation 11/21/13-01/07/14   Right Breast/supraclav.    Past Surgical History:  Procedure Laterality Date  . biospy     female organs  . BREAST LUMPECTOMY WITH RADIOACTIVE SEED LOCALIZATION Right 09/09/2013   Procedure: BREAST LUMPECTOMY WITH RADIOACTIVE SEED LOCALIZATION WITH AXILLARY NODE EXCISION;  Surgeon: [name], MD;  Location: [facility];  Service: General;  Laterality: Right;  . DILATION AND CURETTAGE OF UTERUS    . RE-EXCISION OF BREAST LUMPECTOMY Right 09/24/2013   Procedure: RE-EXCISION OF RIGHT BREAST LUMPECTOMY;  Surgeon: [name], MD;  Location: [facility];  Service: General;  Laterality: Right;  . removal of teeth  04/19/2012   13 teeth removed    There were no vitals filed for this visit.      Subjective Assessment - 12/22/15 1310    Subjective pt states she got relief from numbness in right arm after soft tissue work.  She called [facility] and plans to call for an appointment.  She has laryngitis  today and has a little cough.    Pertinent History breast cancer in 2015 with lumpectomy and radiation.  Multiple complaints of pain in back, legs, neck shoulder and arm. Pt reports she has not followed up with community exercise progra    Patient Stated Goals to get help with pain and swelling    Currently in Pain? Yes   Pain Score 6   took 3 tylenols already    Pain Location Arm   Pain Radiating Towards into neck and shoulder    Pain Onset More than a month ago                         [facility] Adult PT Treatment/Exercise - 12/22/15 0001      Manual Therapy   Soft tissue mobilization in sitting, with biotone to neck and upper trap and first rib area.  Pt still very tender at area of fitst rib, levator attachment and interscapular area.  minor relief with prolonged pressure and soft tissue work    Manual Lymphatic Drainage (MLD) In Supine: Short neck, superficial and deep abdominals, Lt axillary nodes, anterior inter-axillary anastomosis, and Rt UE from dorsal hand to lateral shoulder.                         [facility] Goals - 12/15/15 1743      CC Long  Term Goal  #1   Title Pt will have a reduction in right forearm at 15 cm above the ulnar styloid by 1 cm   Status On-going     CC Long Term Goal  #2   Title Pt will have/know how to get  a compression garment that she is able to use to control her lymphedema    Status Achieved            Plan - 12/22/15 1726    Clinical Impression Statement pt reports that the numbness in her arm is better after last treatment but she still has pain in forearm especially in addition to other areas. She has not gone to [facility] [facility] yet ,but hopes to soon   Clinical Impairments Affecting Rehab Potential previous radiation, chronic pain limiting her activities    PT Next Visit Plan  assess if she got benefit from soft tissue work, continue with manual lymph draiange , range of motion, assess if she ordered  the garments       Patient will benefit from skilled therapeutic intervention in order to improve the following deficits and impairments:  Increased fascial restricitons, Impaired UE functional use, Increased edema  Visit Diagnosis: Lymphedema, not elsewhere classified  Pain In Right Arm  Stiffness of right shoulder, not elsewhere classified     Problem List Patient Active Problem List   Diagnosis Date Noted  . Lymphedema 09/17/2015  . Long term current use of anticoagulant therapy 08/23/2015  . DVT, lower extremity ([place]) 06/18/2015  . Bilateral knee pain 04/03/2015  . Arm edema 08/28/2014  . Vitamin D deficiency 04/23/2014  . Hyperparathyroidism ([place]) 04/23/2014  . Depression 07/18/2013  . Breast cancer of upper-outer quadrant of right female breast ([place]) 07/15/2013  . Encounter for therapeutic drug monitoring 07/05/2013  . Overactive bladder 01/30/2013  . DVT (deep venous thrombosis), left 12/19/2012  . HTN (hypertension) 09/27/2011  . Hearing loss 09/27/2011  . Chest wall pain 09/27/2011  . Seasonal allergies 09/27/2011  . SOB (shortness of breath), related to deconditioning 08/26/2011  . Hypothyroid 08/26/2011   Alicia Vasquez. Owens Shark PT  [name] 12/22/2015, 5:30 PM  [place], [name], [address] Phone: [phone]   Fax:  [phone]  Name: Alicia Vasquez MRN: [id number] Date of Birth: 01-31-1941

## 2015-12-23 ENCOUNTER — Telehealth: Payer: Self-pay

## 2015-12-23 NOTE — Telephone Encounter (Signed)
Patient request a refill of Jantoven 5 MG. Patient stated [facility] sent over a medication refill request last week. Please call patient at [phone]

## 2015-12-24 ENCOUNTER — Ambulatory Visit: Payer: Medicare Other | Admitting: Physical Therapy

## 2015-12-24 ENCOUNTER — Other Ambulatory Visit (HOSPITAL_BASED_OUTPATIENT_CLINIC_OR_DEPARTMENT_OTHER): Payer: Medicare Other

## 2015-12-24 DIAGNOSIS — I890 Lymphedema, not elsewhere classified: Secondary | ICD-10-CM

## 2015-12-24 DIAGNOSIS — M25611 Stiffness of right shoulder, not elsewhere classified: Secondary | ICD-10-CM | POA: Diagnosis not present

## 2015-12-24 DIAGNOSIS — M79601 Pain in right arm: Secondary | ICD-10-CM | POA: Diagnosis not present

## 2015-12-24 DIAGNOSIS — I82403 Acute embolism and thrombosis of unspecified deep veins of lower extremity, bilateral: Secondary | ICD-10-CM

## 2015-12-24 DIAGNOSIS — R5381 Other malaise: Secondary | ICD-10-CM | POA: Diagnosis not present

## 2015-12-24 DIAGNOSIS — C50411 Malignant neoplasm of upper-outer quadrant of right female breast: Secondary | ICD-10-CM

## 2015-12-24 DIAGNOSIS — I82402 Acute embolism and thrombosis of unspecified deep veins of left lower extremity: Secondary | ICD-10-CM

## 2015-12-24 LAB — PROTIME-INR
INR: 3.6 — ABNORMAL HIGH (ref 2.00–3.50)
Protime: 43.2 Seconds — ABNORMAL HIGH (ref 10.6–13.4)

## 2015-12-24 NOTE — Patient Instructions (Signed)
  Do these exercises lying on our back and also in sitting     Over Head Pull: Narrow Grip        On back, knees bent, feet flat, band across thighs, elbows straight but relaxed. Pull hands apart (start). Keeping elbows straight, bring arms up and over head, hands toward floor. Keep pull steady on band. Hold momentarily. Return slowly, keeping pull steady, back to start. Repeat 5-10___ times. Band color _yellow _____    Shoulder Rotation: Double Arm   On back, knees bent, feet flat, elbows tucked at sides, bent 90, hands palms up. Pull hands apart and down toward floor, keeping elbows near sides. Hold momentarily. Slowly return to starting position. Repeat _5-10__ times. Band color __yellow ____

## 2015-12-24 NOTE — Therapy (Signed)
[place], [name], [address] Phone: [phone]   Fax:  [phone]  Physical Therapy Treatment  Patient Details  Name: Alicia Vasquez MRN: [id number] Date of Birth: 10-07-1940 Referring Provider: Dr.Magrinat  Encounter Date: 12/24/2015      PT End of Session - 12/24/15 1745    Visit Number 5   Number of Visits 9   Date for PT Re-Evaluation 01/08/16   PT Start Time [id number]   PT Stop Time 1345   PT Time Calculation (min) 42 min   Activity Tolerance Patient tolerated treatment well   Behavior During Therapy [facility] for tasks assessed/performed      Past Medical History:  Diagnosis Date  . Allergy   . Anxiety   . Arthritis   . Blood transfusion without reported diagnosis   . Breast cancer ([place]) 07/12/13   invasive mammary carcinoma  . DVT (deep vein thrombosis) in pregnancy   . Hypertension   . Hypothyroid   . Pneumonia   . Radiation 11/21/13-01/07/14   Right Breast/supraclav.    Past Surgical History:  Procedure Laterality Date  . biospy     female organs  . BREAST LUMPECTOMY WITH RADIOACTIVE SEED LOCALIZATION Right 09/09/2013   Procedure: BREAST LUMPECTOMY WITH RADIOACTIVE SEED LOCALIZATION WITH AXILLARY NODE EXCISION;  Surgeon: [name], MD;  Location: [place];  Service: General;  Laterality: Right;  . DILATION AND CURETTAGE OF UTERUS    . RE-EXCISION OF BREAST LUMPECTOMY Right 09/24/2013   Procedure: RE-EXCISION OF RIGHT BREAST LUMPECTOMY;  Surgeon: [name], MD;  Location: [place];  Service: General;  Laterality: Right;  . removal of teeth  04/19/2012   13 teeth removed    There were no vitals filed for this visit.      Subjective Assessment - 12/24/15 1738    Subjective Pt hopes to go to [facility] today after session.  She still has pain in her arm, but reports the numbness is better.  She still has laryngitis and a cough    Pertinent History breast cancer in 2015 with lumpectomy and radiation.  Multiple complaints of pain in back, legs, neck shoulder and arm. Pt reports she has not followed up with community exercise progra    Patient Stated Goals to get help with pain and swelling    Currently in Pain? Yes   Pain Score 6    Pain Location Arm   Pain Orientation Right   Pain Descriptors / Indicators Aching   Pain Type Chronic pain               LYMPHEDEMA/ONCOLOGY QUESTIONNAIRE - 12/24/15 1308      Right Upper Extremity Lymphedema   15 cm Proximal to Olecranon Process 36.5 cm   10 cm Proximal to Olecranon Process 34.5 cm   Olecranon Process 28.5 cm   15 cm Proximal to Ulnar Styloid Process 27 cm   10 cm Proximal to Ulnar Styloid Process 23.9 cm   Just Proximal to Ulnar Styloid Process 16.7 cm   Across Hand at [employer] 19.3 cm   At [place] of 2nd Digit 6.1 cm                  [facility] Adult PT Treatment/Exercise - 12/24/15 0001      Shoulder Exercises: Supine   External Rotation Strengthening;Both;10 reps  supine and sittingfrequent cues to keep elbows bent at sides   Theraband Level (Shoulder External Rotation)  Level 1 (Yellow)   Flexion Strengthening;Both;5 reps;Theraband   Theraband Level (Shoulder Flexion) Level 1 (Yellow)   Flexion Limitations narrow and wide grip      Manual Therapy   Myofascial Release emphasis today on painful, tight areas in posterior axilla and lateral chest that impved some with prolonged pressure Pt with exaggerated response to pain when these areas are originally compressed, but it does decrease with prolonged pressure    Manual Lymphatic Drainage (MLD) In Supine: Short neck, superficial and deep abdominals, Lt axillary nodes, anterior inter-axillary anastomosis, and Rt UE from dorsal hand to lateral shoulder.                 PT Education - 12/24/15 1744    Education provided Yes   Education Details reviewed scapular exercises    Person(s)  Educated Patient   Methods Explanation;Demonstration;Handout   Comprehension Verbalized understanding;Returned demonstration                Long Term Clinic Goals - 12/24/15 1747      CC Long Term Goal  #1   Title Pt will have a reduction in right forearm at 15 cm above the ulnar styloid by 1 cm   Baseline 27.5 on 12/08/2015, 27 on 12/24/2015   Status On-going     CC Long Term Goal  #2   Title Pt will have/know how to get  a compression garment that she is able to use to control her lymphedema    Status Achieved            Plan - 12/24/15 1746    Clinical Impression Statement pt has had reduction in circumference in forearm and feels that her arm may be getting a little better.  She still has not gotten her bandaging alternative garment   Clinical Impairments Affecting Rehab Potential previous radiation, chronic pain limiting her activities    PT Next Visit Plan  assess if she got benefit from soft tissue work, continue with manual lymph draiange , range of motion, assess if she ordered the garments       Patient will benefit from skilled therapeutic intervention in order to improve the following deficits and impairments:  Increased fascial restricitons, Impaired UE functional use, Increased edema  Visit Diagnosis: Lymphedema, not elsewhere classified  Pain In Right Arm  Stiffness of right shoulder, not elsewhere classified  Physical deconditioning     Problem List Patient Active Problem List   Diagnosis Date Noted  . Lymphedema 09/17/2015  . Long term current use of anticoagulant therapy 08/23/2015  . DVT, lower extremity ([place]) 06/18/2015  . Bilateral knee pain 04/03/2015  . Arm edema 08/28/2014  . Vitamin D deficiency 04/23/2014  . Hyperparathyroidism ([place]) 04/23/2014  . Depression 07/18/2013  . Breast cancer of upper-outer quadrant of right female breast ([place]) 07/15/2013  . Encounter for therapeutic drug monitoring 07/05/2013  . Overactive bladder  01/30/2013  . DVT (deep venous thrombosis), left 12/19/2012  . HTN (hypertension) 09/27/2011  . Hearing loss 09/27/2011  . Chest wall pain 09/27/2011  . Seasonal allergies 09/27/2011  . SOB (shortness of breath), related to deconditioning 08/26/2011  . Hypothyroid 08/26/2011  [name]. Owens Shark PT    [name] 12/24/2015, 5:49 PM  [place], [name], [address] Phone: [phone]   Fax:  [phone]  Name: Alicia Vasquez MRN: [id number] Date of Birth: 1941-03-29

## 2015-12-24 NOTE — Telephone Encounter (Signed)
Please advise 

## 2015-12-25 ENCOUNTER — Other Ambulatory Visit: Payer: Self-pay | Admitting: Physician Assistant

## 2015-12-25 MED ORDER — WARFARIN SODIUM 5 MG PO TABS: ORAL_TABLET | ORAL | 0 refills | Status: DC

## 2015-12-25 NOTE — Telephone Encounter (Signed)
This needs to go to Dr Jana Hakim - so I will send to him

## 2015-12-29 ENCOUNTER — Ambulatory Visit: Payer: Medicare Other | Attending: Oncology | Admitting: Physical Therapy

## 2015-12-29 DIAGNOSIS — R5381 Other malaise: Secondary | ICD-10-CM

## 2015-12-29 DIAGNOSIS — M79601 Pain in right arm: Secondary | ICD-10-CM | POA: Insufficient documentation

## 2015-12-29 DIAGNOSIS — I890 Lymphedema, not elsewhere classified: Secondary | ICD-10-CM

## 2015-12-29 DIAGNOSIS — M25611 Stiffness of right shoulder, not elsewhere classified: Secondary | ICD-10-CM | POA: Insufficient documentation

## 2015-12-29 NOTE — Therapy (Signed)
[place], [name], [address] Phone: [phone]   Fax:  [phone]  Physical Therapy Treatment  Patient Details  Name: Alicia Vasquez MRN: [id number] Date of Birth: 1941/04/28 Referring Provider: Dr.Magrinat  Encounter Date: 12/29/2015      PT End of Session - 12/29/15 1734    Visit Number 6   Number of Visits 9   Date for PT Re-Evaluation 01/08/16   PT Start Time [id number]   PT Stop Time 1345   PT Time Calculation (min) 40 min   Activity Tolerance Patient tolerated treatment well   Behavior During Therapy [facility] for tasks assessed/performed      Past Medical History:  Diagnosis Date  . Allergy   . Anxiety   . Arthritis   . Blood transfusion without reported diagnosis   . Breast cancer ([place]) 07/12/13   invasive mammary carcinoma  . DVT (deep vein thrombosis) in pregnancy   . Hypertension   . Hypothyroid   . Pneumonia   . Radiation 11/21/13-01/07/14   Right Breast/supraclav.    Past Surgical History:  Procedure Laterality Date  . biospy     female organs  . BREAST LUMPECTOMY WITH RADIOACTIVE SEED LOCALIZATION Right 09/09/2013   Procedure: BREAST LUMPECTOMY WITH RADIOACTIVE SEED LOCALIZATION WITH AXILLARY NODE EXCISION;  Surgeon: [name], MD;  Location: [place];  Service: General;  Laterality: Right;  . DILATION AND CURETTAGE OF UTERUS    . RE-EXCISION OF BREAST LUMPECTOMY Right 09/24/2013   Procedure: RE-EXCISION OF RIGHT BREAST LUMPECTOMY;  Surgeon: [name], MD;  Location: [place];  Service: General;  Laterality: Right;  . removal of teeth  04/19/2012   13 teeth removed    There were no vitals filed for this visit.      Subjective Assessment - 12/29/15 1305    Subjective Pt went to [facility], but there was noone there to [facility] her even though pt had called, so she [facility] [place].  She made arrangements to get a  farrowwrap and a compression sleeve. She said it might come in at the end of the week  She says her arm is doing better as far as the swelling goes    Pertinent History breast cancer in 2015 with lumpectomy and radiation.  Multiple complaints of pain in back, legs, neck shoulder and arm. Pt reports she has not followed up with community exercise progra    Patient Stated Goals to get help with pain and swelling    Currently in Pain? Yes   Pain Score 6   has taken tylenol    Pain Location Arm   Pain Orientation Right   Pain Type Chronic pain   Pain Radiating Towards into neck and around back to shoulder    Pain Onset More than a month ago                         [facility] Adult PT Treatment/Exercise - 12/29/15 0001      Neck Exercises: Seated   Other Seated Exercise neck and upper thoracic range of motion exercise   continues with problems coordinating right shoulder circlesl     Shoulder Exercises: Seated   External Rotation AROM;Both;10 reps;Theraband   Theraband Level (Shoulder External Rotation) Level 1 (Yellow)     Manual Therapy   Soft tissue mobilization in supine and left sidelying to right axilla and lateral chest  also  on right upper trap area    Myofascial Release emphasis today on painful, tight areas in posterior axilla and lateral chest that impved some with prolonged pressure Pt with exaggerated response to pain when these areas are originally compressed, but it does decrease with prolonged pressure                         Long Term Clinic Goals - 12/24/15 1747      CC Long Term Goal  #1   Title Pt will have a reduction in right forearm at 15 cm above the ulnar styloid by 1 cm   Baseline 27.5 on 12/08/2015, 27 on 12/24/2015   Status On-going     CC Long Term Goal  #2   Title Pt will have/know how to get  a compression garment that she is able to use to control her lymphedema    Status Achieved            Plan - 12/29/15 1734     Clinical Impression Statement Pt continues to have complaints of pain in right shoulder and arm, but says it is better.  She has difficulty with coordinating active movement of right scapula with complaints of pain with  shrugs and is unable to do shoulder circle. Pt hopeful to get compressio garments soon   Rehab Potential Good   Clinical Impairments Affecting Rehab Potential previous radiation, chronic pain limiting her activities    PT Next Visit Plan  assess if she got benefit from soft tissue work, continue with manual lymph draiange , range of motion, assess if she ordered the garments       Patient will benefit from skilled therapeutic intervention in order to improve the following deficits and impairments:  Increased fascial restricitons, Impaired UE functional use, Increased edema  Visit Diagnosis: Lymphedema, not elsewhere classified  Pain In Right Arm  Stiffness of right shoulder, not elsewhere classified  Physical deconditioning     Problem List Patient Active Problem List   Diagnosis Date Noted  . Lymphedema 09/17/2015  . Long term current use of anticoagulant therapy 08/23/2015  . DVT, lower extremity ([place]) 06/18/2015  . Bilateral knee pain 04/03/2015  . Arm edema 08/28/2014  . Vitamin D deficiency 04/23/2014  . Hyperparathyroidism ([place]) 04/23/2014  . Depression 07/18/2013  . Breast cancer of upper-outer quadrant of right female breast ([place]) 07/15/2013  . Encounter for therapeutic drug monitoring 07/05/2013  . Overactive bladder 01/30/2013  . DVT (deep venous thrombosis), left 12/19/2012  . HTN (hypertension) 09/27/2011  . Hearing loss 09/27/2011  . Chest wall pain 09/27/2011  . Seasonal allergies 09/27/2011  . SOB (shortness of breath), related to deconditioning 08/26/2011  . Hypothyroid 08/26/2011   [name]. Owens Shark PT  [name] 12/29/2015, 5:39 PM  [place], [name], [address] Phone: [phone]   Fax:  [phone]  Name: Alicia Vasquez MRN: [id number] Date of Birth: [birth year]

## 2015-12-31 ENCOUNTER — Ambulatory Visit (HOSPITAL_BASED_OUTPATIENT_CLINIC_OR_DEPARTMENT_OTHER): Payer: Medicare Other | Admitting: Nurse Practitioner

## 2015-12-31 ENCOUNTER — Other Ambulatory Visit: Payer: Self-pay

## 2015-12-31 ENCOUNTER — Other Ambulatory Visit (HOSPITAL_BASED_OUTPATIENT_CLINIC_OR_DEPARTMENT_OTHER): Payer: Medicare Other

## 2015-12-31 ENCOUNTER — Ambulatory Visit: Payer: Medicare Other | Admitting: Physical Therapy

## 2015-12-31 VITALS — BP 137/70 | HR 69 | Temp 98.2°F | Resp 18 | Ht 64.0 in | Wt 212.2 lb

## 2015-12-31 DIAGNOSIS — M25611 Stiffness of right shoulder, not elsewhere classified: Secondary | ICD-10-CM

## 2015-12-31 DIAGNOSIS — R609 Edema, unspecified: Secondary | ICD-10-CM | POA: Diagnosis not present

## 2015-12-31 DIAGNOSIS — R5381 Other malaise: Secondary | ICD-10-CM

## 2015-12-31 DIAGNOSIS — I890 Lymphedema, not elsewhere classified: Secondary | ICD-10-CM | POA: Diagnosis not present

## 2015-12-31 DIAGNOSIS — I82402 Acute embolism and thrombosis of unspecified deep veins of left lower extremity: Secondary | ICD-10-CM

## 2015-12-31 DIAGNOSIS — C50411 Malignant neoplasm of upper-outer quadrant of right female breast: Secondary | ICD-10-CM

## 2015-12-31 DIAGNOSIS — I82403 Acute embolism and thrombosis of unspecified deep veins of lower extremity, bilateral: Secondary | ICD-10-CM | POA: Diagnosis present

## 2015-12-31 DIAGNOSIS — R600 Localized edema: Secondary | ICD-10-CM

## 2015-12-31 DIAGNOSIS — M79601 Pain in right arm: Secondary | ICD-10-CM

## 2015-12-31 DIAGNOSIS — Z7901 Long term (current) use of anticoagulants: Secondary | ICD-10-CM

## 2015-12-31 LAB — PROTIME-INR
INR: 1.9 — AB (ref 2.00–3.50)
PROTIME: 22.8 s — AB (ref 10.6–13.4)

## 2015-12-31 NOTE — Therapy (Signed)
[place], [name], [address] Phone: [phone]   Fax:  [phone]  Physical Therapy Treatment  Patient Details  Name: Alicia Vasquez MRN: [id number] Date of Birth: June 07, 1940 Referring Provider: Dr.Magrinat  Encounter Date: 12/31/2015      PT End of Session - 12/31/15 1357    Visit Number 7   Number of Visits 9   Date for PT Re-Evaluation 01/08/16   PT Start Time 1320   PT Stop Time 1352   PT Time Calculation (min) 32 min   Activity Tolerance Patient tolerated treatment well   Behavior During Therapy [facility] for tasks assessed/performed      Past Medical History:  Diagnosis Date  . Allergy   . Anxiety   . Arthritis   . Blood transfusion without reported diagnosis   . Breast cancer ([place]) 07/12/13   invasive mammary carcinoma  . DVT (deep vein thrombosis) in pregnancy   . Hypertension   . Hypothyroid   . Pneumonia   . Radiation 11/21/13-01/07/14   Right Breast/supraclav.    Past Surgical History:  Procedure Laterality Date  . biospy     female organs  . BREAST LUMPECTOMY WITH RADIOACTIVE SEED LOCALIZATION Right 09/09/2013   Procedure: BREAST LUMPECTOMY WITH RADIOACTIVE SEED LOCALIZATION WITH AXILLARY NODE EXCISION;  Surgeon: [name], MD;  Location: [place];  Service: General;  Laterality: Right;  . DILATION AND CURETTAGE OF UTERUS    . RE-EXCISION OF BREAST LUMPECTOMY Right 09/24/2013   Procedure: RE-EXCISION OF RIGHT BREAST LUMPECTOMY;  Surgeon: [name], MD;  Location: [place];  Service: General;  Laterality: Right;  . removal of teeth  04/19/2012   13 teeth removed    There were no vitals filed for this visit.      Subjective Assessment - 12/31/15 1319    [facility] to get compression bras but they had to be ordered. She also has the farrow wrap, sleeve and gauntlet on order Pt continues with multiple  complaints    Pertinent History breast cancer in 2015 with lumpectomy and radiation.  Multiple complaints of pain in back, legs, neck shoulder and arm. Pt reports she has not followed up with community exercise progra    Patient Stated Goals to get help with pain and swelling    Currently in Pain? Yes   Pain Score 6    Pain Location Arm   Pain Radiating Towards down back and under her arm , also across chest where her bra strap hits    Pain Frequency Constant                         [facility] Adult PT Treatment/Exercise - 12/31/15 0001      Shoulder Exercises: Supine   External Rotation Strengthening;Both;10 reps  supine and sittingfrequent cues to keep elbows bent at sides   Theraband Level (Shoulder External Rotation) Level 1 (Yellow)   Flexion Strengthening;Both;5 reps;Theraband   Theraband Level (Shoulder Flexion) Level 1 (Yellow)   Flexion Limitations narrow and wide grip    Other Supine Exercises dowel rod for shoulder flexion  and abduction 10 reps each    Other Supine Exercises diagonal elevation with yellow theraband 5  reps with each arm      Manual Therapy   Manual Lymphatic Drainage (MLD) In Supine: Short neck, superficial and deep abdominals, Lt axillary nodes, anterior inter-axillary anastomosis, and Rt UE from  dorsal hand to lateral shoulder.  then to sidelying for posterior back and scapular area                         [place] - 12/24/15 1747      CC Long Term Goal  #1   Title Pt will have a reduction in right forearm at 15 cm above the ulnar styloid by 1 cm   Baseline 27.5 on 12/08/2015, 27 on 12/24/2015   Status On-going     CC Long Term Goal  #2   Title Pt will have/know how to get  a compression garment that she is able to use to control her lymphedema    Status Achieved            Plan - 12/31/15 1358    Clinical Impression Statement pt went to try on several compression bras yesterday and reports increased  shoulder and side pain today.  She got some relief with manual lymph drainage and shoulder exercise , but still has multiple complaints of pain and swelling.  Encouraged pt to continue walking and exerdcise on her own.    Rehab Potential Good   Clinical Impairments Affecting Rehab Potential previous radiation, chronic pain limiting her activities    PT Treatment/Interventions Manual techniques   PT Next Visit Plan  continue with manual lymph draiange , range of motion, assess if she ordered the garments       Patient will benefit from skilled therapeutic intervention in order to improve the following deficits and impairments:  Increased fascial restricitons, Impaired UE functional use, Increased edema  Visit Diagnosis: Lymphedema, not elsewhere classified  Pain In Right Arm  Stiffness of right shoulder, not elsewhere classified  Physical deconditioning     Problem List Patient Active Problem List   Diagnosis Date Noted  . Lymphedema 09/17/2015  . Long term current use of anticoagulant therapy 08/23/2015  . DVT, lower extremity ([place]) 06/18/2015  . Bilateral knee pain 04/03/2015  . Arm edema 08/28/2014  . Vitamin D deficiency 04/23/2014  . Hyperparathyroidism ([place]) 04/23/2014  . Depression 07/18/2013  . Breast cancer of upper-outer quadrant of right female breast ([place]) 07/15/2013  . Encounter for therapeutic drug monitoring 07/05/2013  . Overactive bladder 01/30/2013  . DVT (deep venous thrombosis), left 12/19/2012  . HTN (hypertension) 09/27/2011  . Hearing loss 09/27/2011  . Chest wall pain 09/27/2011  . Seasonal allergies 09/27/2011  . SOB (shortness of breath), related to deconditioning 08/26/2011  . Hypothyroid 08/26/2011   [name]. Owens Shark PT  [name] 12/31/2015, 2:00 PM  [place], [name], [address] Phone: [phone]   Fax:  [phone]  Name: Alicia Vasquez MRN:  [id number] Date of Birth: 1940-12-16

## 2016-01-01 ENCOUNTER — Ambulatory Visit (HOSPITAL_COMMUNITY)
Admission: RE | Admit: 2016-01-01 | Discharge: 2016-01-01 | Disposition: A | Discharge status: Home / Self Care | Payer: Medicare Other | Source: Ambulatory Visit | Attending: Nurse Practitioner | Admitting: Nurse Practitioner

## 2016-01-01 DIAGNOSIS — R609 Edema, unspecified: Secondary | ICD-10-CM

## 2016-01-01 DIAGNOSIS — C50411 Malignant neoplasm of upper-outer quadrant of right female breast: Secondary | ICD-10-CM

## 2016-01-01 DIAGNOSIS — R600 Localized edema: Secondary | ICD-10-CM

## 2016-01-01 NOTE — Progress Notes (Signed)
*  Preliminary Results* Right upper extremity venous duplex completed. Right upper extremity is negative for deep and superficial vein thrombosis.  01/01/2016 4:07 PM  Maudry Mayhew, B.S., RVT, RDCS, RDMS

## 2016-01-05 ENCOUNTER — Ambulatory Visit: Payer: Medicare Other | Admitting: Physical Therapy

## 2016-01-05 DIAGNOSIS — M25611 Stiffness of right shoulder, not elsewhere classified: Secondary | ICD-10-CM | POA: Diagnosis not present

## 2016-01-05 DIAGNOSIS — I890 Lymphedema, not elsewhere classified: Secondary | ICD-10-CM | POA: Diagnosis not present

## 2016-01-05 DIAGNOSIS — R5381 Other malaise: Secondary | ICD-10-CM

## 2016-01-05 DIAGNOSIS — M79601 Pain in right arm: Secondary | ICD-10-CM | POA: Diagnosis not present

## 2016-01-05 NOTE — Therapy (Signed)
[place], [name], [address] Phone: [phone]   Fax:  [phone]  Physical Therapy Treatment  Patient Details  Name: Alicia Vasquez MRN: [id number] Date of Birth: 1940/12/13 Referring Provider: Dr.Magrinat  Encounter Date: 01/05/2016      PT End of Session - 01/05/16 1749    Visit Number 8   Number of Visits 9   Date for PT Re-Evaluation 01/08/16   PT Start Time 1312   PT Stop Time 1345  pt arrived late for PT   PT Time Calculation (min) 33 min   Activity Tolerance Patient tolerated treatment well   Behavior During Therapy [facility] for tasks assessed/performed      Past Medical History:  Diagnosis Date  . Allergy   . Anxiety   . Arthritis   . Blood transfusion without reported diagnosis   . Breast cancer ([place]) 07/12/13   invasive mammary carcinoma  . DVT (deep vein thrombosis) in pregnancy   . Hypertension   . Hypothyroid   . Pneumonia   . Radiation 11/21/13-01/07/14   Right Breast/supraclav.    Past Surgical History:  Procedure Laterality Date  . biospy     female organs  . BREAST LUMPECTOMY WITH RADIOACTIVE SEED LOCALIZATION Right 09/09/2013   Procedure: BREAST LUMPECTOMY WITH RADIOACTIVE SEED LOCALIZATION WITH AXILLARY NODE EXCISION;  Surgeon: [name], MD;  Location: [place];  Service: General;  Laterality: Right;  . DILATION AND CURETTAGE OF UTERUS    . RE-EXCISION OF BREAST LUMPECTOMY Right 09/24/2013   Procedure: RE-EXCISION OF RIGHT BREAST LUMPECTOMY;  Surgeon: [name], MD;  Location: [place];  Service: General;  Laterality: Right;  . removal of teeth  04/19/2012   13 teeth removed    There were no vitals filed for this visit.      Subjective Assessment - 01/05/16 1312    Subjective pt states she feel better but she is still having alot of pain .  She was at the cancer center to have her coumadin checked and was ordered to  have a  ultrasound of right arm due to visible swelling.  It is negative for DVT.  She says her farrow wrap and sleeve are in.    Pertinent History breast cancer in 2015 with lumpectomy and radiation.  Multiple complaints of pain in back, legs, neck shoulder and arm. Pt reports she has not followed up with community exercise progra    Patient Stated Goals to get help with pain and swelling    Currently in Pain? Yes   Pain Score 6    Pain Location Axilla   Pain Radiating Towards under arm, and down back to lower back and to the side.  Pain extends from the front to the back    Pain Onset More than a month ago   Aggravating Factors  when she tries to use her arm with household chores   Pain Relieving Factors PT, tylenol                          [facility] Adult PT Treatment/Exercise - 01/05/16 0001      Neck Exercises: Seated   Shoulder Shrugs 5 reps   Shoulder Rolls 5 reps     Manual Therapy   Soft tissue mobilization in supine and left sidelying to right axilla and lateral chest  also on right upper trap area    Manual Lymphatic Drainage (MLD) In  Supine: Short neck, superficial and deep abdominals, Lt axillary nodes, anterior inter-axillary anastomosis, and Rt UE from dorsal hand to lateral shoulder.  then to sidelying for posterior back and scapular area                         [place] - 12/24/15 1747      CC Long Term Goal  #1   Title Pt will have a reduction in right forearm at 15 cm above the ulnar styloid by 1 cm   Baseline 27.5 on 12/08/2015, 27 on 12/24/2015   Status On-going     CC Long Term Goal  #2   Title Pt will have/know how to get  a compression garment that she is able to use to control her lymphedema    Status Achieved            Plan - 01/05/16 1749    Clinical Impression Statement Pt continues with multiple areas of pain, but says that overall, she feels better. She is considering going to [facility] September She is going to get compression wraps today and will bring them to next treatment    Clinical Impairments Affecting Rehab Potential previous radiation, chronic pain limiting her activities    PT Next Visit Plan  continue with manual lymph draiange , range of motion, assess garments       Patient will benefit from skilled therapeutic intervention in order to improve the following deficits and impairments:  Increased fascial restricitons, Impaired UE functional use, Increased edema  Visit Diagnosis: Lymphedema, not elsewhere classified  Pain In Right Arm  Stiffness of right shoulder, not elsewhere classified  Physical deconditioning     Problem List Patient Active Problem List   Diagnosis Date Noted  . Lymphedema 09/17/2015  . Long term current use of anticoagulant therapy 08/23/2015  . DVT, lower extremity ([place]) 06/18/2015  . Bilateral knee pain 04/03/2015  . Arm edema 08/28/2014  . Vitamin D deficiency 04/23/2014  . Hyperparathyroidism ([place]) 04/23/2014  . Depression 07/18/2013  . Breast cancer of upper-outer quadrant of right female breast ([place]) 07/15/2013  . Encounter for therapeutic drug monitoring 07/05/2013  . Overactive bladder 01/30/2013  . DVT (deep venous thrombosis), left 12/19/2012  . HTN (hypertension) 09/27/2011  . Hearing loss 09/27/2011  . Chest wall pain 09/27/2011  . Seasonal allergies 09/27/2011  . SOB (shortness of breath), related to deconditioning 08/26/2011  . Hypothyroid 08/26/2011   [name]. Owens Shark PT   [name] 01/05/2016, 5:52 PM  [place], [name], [address] Phone: [phone]   Fax:  [phone]  Name: Alicia Vasquez MRN: [id number] Date of Birth: 07-18-1940

## 2016-01-06 ENCOUNTER — Telehealth: Payer: Self-pay | Admitting: Nurse Practitioner

## 2016-01-06 ENCOUNTER — Encounter: Payer: Self-pay | Admitting: Nurse Practitioner

## 2016-01-06 NOTE — Telephone Encounter (Signed)
Late entry: Called patient to review negative Doppler ultrasound results for  right arm..  Patient was advised to continue elevating her arm is much as possible; and to continue following up with the lymphedema clinic as directed.

## 2016-01-06 NOTE — Assessment & Plan Note (Signed)
Patient has recently restarted her anastrozole oral therapy.  She is scheduled to return for labs only on 01/07/2016.  She is scheduled for labs again for a Coumadin check on 01/21/2016.  She is scheduled for labs and a follow-up visit on 03/17/2016.

## 2016-01-06 NOTE — Assessment & Plan Note (Signed)
Patient continues to take Coumadin as directed for history of DVT.  Patient was a little unclear with her Coumadin dosing; but states that she typically takes Coumadin 7.5 mg which is 1-1/2 tablets every Monday, Wednesday and Friday.  She takes Coumadin 10 mg on every other day of the week.  INR today was mildly non-therapeutic at 1.9.  Patient was advised to change her Coumadin dosing to: Coumadin 7.5 mg which is 1-1/2 tablets on Monday and Wednesday only.  She should take Coumadin 10 mg which is 2 tablets on every other day of the week.  She is scheduled to return for labs only on Thursday, 01/07/2016.

## 2016-01-06 NOTE — Progress Notes (Signed)
SYMPTOM MANAGEMENT CLINIC    Chief Complaint: Lymphedema  HPI:  Alicia Vasquez 75 y.o. female diagnosed with breast cancer.  Currently undergoing anastrozole oral therapy.  Patient presents to the [facility] today with complaint of increased edema to the right upper extremity.    No history exists.    Review of Systems  Musculoskeletal:       Complaint of increased edema to the right upper extremity.  All other systems reviewed and are negative.   Past Medical History:  Diagnosis Date  . Allergy   . Anxiety   . Arthritis   . Blood transfusion without reported diagnosis   . Breast cancer ([place]) 07/12/13   invasive mammary carcinoma  . DVT (deep vein thrombosis) in pregnancy   . Hypertension   . Hypothyroid   . Pneumonia   . Radiation 11/21/13-01/07/14   Right Breast/supraclav.    Past Surgical History:  Procedure Laterality Date  . biospy     female organs  . BREAST LUMPECTOMY WITH RADIOACTIVE SEED LOCALIZATION Right 09/09/2013   Procedure: BREAST LUMPECTOMY WITH RADIOACTIVE SEED LOCALIZATION WITH AXILLARY NODE EXCISION;  Surgeon: [name], MD;  Location: [facility];  Service: General;  Laterality: Right;  . DILATION AND CURETTAGE OF UTERUS    . RE-EXCISION OF BREAST LUMPECTOMY Right 09/24/2013   Procedure: RE-EXCISION OF RIGHT BREAST LUMPECTOMY;  Surgeon: [name], MD;  Location: [facility];  Service: General;  Laterality: Right;  . removal of teeth  04/19/2012   13 teeth removed    has SOB (shortness of breath), related to deconditioning; Hypothyroid; HTN (hypertension); Hearing loss; Chest wall pain; Seasonal allergies; DVT (deep venous thrombosis), left; Overactive bladder; Encounter for therapeutic drug monitoring; Breast cancer of upper-outer quadrant of right female breast ([place]); Depression; Vitamin D deficiency; Hyperparathyroidism ([place]); Arm edema; Bilateral knee pain; DVT, lower extremity ([place]); Long term  current use of anticoagulant therapy; and Lymphedema on her problem list.    is allergic to anesthetics, amide; benadryl [diphenhydramine hcl]; carbocaine [mepivacaine hcl]; codeine; epinephrine; sulfa antibiotics; vicodin [hydrocodone-acetaminophen]; and penicillins.    Medication List       Accurate as of 12/31/15 11:59 PM. Always use your most recent med list.          acetaminophen 500 MG tablet Commonly known as:  TYLENOL Take 500 mg by mouth every 6 (six) hours as needed for mild pain, fever or headache. Reported on 09/08/2015   HYDROcodone-acetaminophen 5-325 MG tablet Commonly known as:  NORCO Take 1 tablet by mouth every 6 (six) hours as needed for moderate pain.   levothyroxine 125 MCG tablet Commonly known as:  SYNTHROID Take 1 tablet (125 mcg total) by mouth daily before breakfast. Dispense as written: Synthroid   lisinopril 10 MG tablet Commonly known as:  PRINIVIL,ZESTRIL TAKE 1 TABLET (10 MG TOTAL) BY MOUTH DAILY.   non-metallic deodorant Misc Commonly known as:  ALRA Apply 1 application topically daily. Reported on 08/25/2015   Vitamin D3 5000 units Tabs Take by mouth.   warfarin 5 MG tablet Commonly known as:  JANTOVEN TAKE AS DIRECTED BY ANTICOAGULATION CLINIC   warfarin 5 MG tablet Commonly known as:  COUMADIN Take 7.5 mg on M,W,F,Sun, and 10 mg on all other days.        PHYSICAL EXAMINATION  Oncology Vitals 12/31/2015 12/17/2015  Height 163 cm 163 cm  Weight 96.253 kg 96.163 kg  Weight (lbs) 212 lbs 3 oz 212 lbs  BMI (kg/m2) 36.42 kg/m2  36.39 kg/m2  Temp 98.2 98.3  Pulse 69 99  Resp 18 18  SpO2 98 95  BSA (m2) 2.09 m2 2.08 m2   BP Readings from Last 2 Encounters:  12/31/15 137/70  12/17/15 (!) 137/58    Physical Exam  Constitutional: She is oriented to person, place, and time and well-developed, well-nourished, and in no distress.  HENT:  Head: Normocephalic and atraumatic.  Mouth/Throat: Oropharynx is clear and moist.  Eyes:  Conjunctivae and EOM are normal. Pupils are equal, round, and reactive to light. Right eye exhibits no discharge. Left eye exhibits no discharge. No scleral icterus.  Neck: Normal range of motion. Neck supple. No JVD present. No tracheal deviation present. No thyromegaly present.  Cardiovascular: Normal rate, regular rhythm, normal heart sounds and intact distal pulses.   Pulmonary/Chest: Effort normal and breath sounds normal. No respiratory distress. She has no wheezes. She has no rales. She exhibits no tenderness.  Abdominal: Soft. Bowel sounds are normal. She exhibits no distension and no mass. There is no tenderness. There is no rebound and no guarding.  Musculoskeletal: Normal range of motion. She exhibits edema. She exhibits no tenderness.  Lymphedema to the right upper extremity.  No evidence of infection on exam.  Lymphadenopathy:    She has no cervical adenopathy.  Neurological: She is alert and oriented to person, place, and time. Gait normal.  Skin: Skin is warm and dry. No rash noted. No erythema. No pallor.  Psychiatric: Affect normal.    LABORATORY DATA:. Appointment on 12/31/2015  Component Date Value Ref Range Status  . Protime 12/31/2015 22.8* 10.6 - 13.4 Seconds Final  . INR 12/31/2015 1.90* 2.00 - 3.50 Final   Comment: INR is useful only to assess adequacy of anticoagulation with coumadin when comparing results from different labs. It should not be used to estimate bleeding risk or presence/abscense of coagulopathy in patients not on coumadin. Expected INR ranges for  nontherapeutic patients is 0.88 - 1.12.   Marland Kitchen Lovenox 12/31/2015 No   Final    RADIOGRAPHIC STUDIES: No results found.  ASSESSMENT/PLAN:    Long term current use of anticoagulant therapy Patient continues to take Coumadin as directed for history of DVT.  Patient was a little unclear with her Coumadin dosing; but states that she typically takes Coumadin 7.5 mg which is 1-1/2 tablets every Monday, Wednesday  and Friday.  She takes Coumadin 10 mg on every other day of the week.  INR today was mildly non-therapeutic at 1.9.  Patient was advised to change her Coumadin dosing to: Coumadin 7.5 mg which is 1-1/2 tablets on Monday and Wednesday only.  She should take Coumadin 10 mg which is 2 tablets on every other day of the week.  She is scheduled to return for labs only on Thursday, 01/07/2016.  Breast cancer of upper-outer quadrant of right female breast [facility]) Patient has recently restarted her anastrozole oral therapy.  She is scheduled to return for labs only on 01/07/2016.  She is scheduled for labs again for a Coumadin check on 01/21/2016.  She is scheduled for labs and a follow-up visit on 03/17/2016.  Arm edema Patient has a history of chronic right upper extremity lymphedema/edema.  She states that the lymphedema to her right upper extremity has slightly increased within the past few days.  She states that she has been seen by the lymphedema clinic; and continues to visit the lymphedema clinic on a fairly regular basis.  She states that her compression sleeve has become ill-fitting;  and she is awaiting the arrival of a new compression sleeve.  Exam of the right upper extremity reveals lymphedema; but no obvious infection.  Patient continues with full range of motion.  Doppler ultrasound of the right upper extremity will be obtained in the morning; for evaluation of DVT.  Previous Doppler ultrasound of the right upper extremity was negative for DVT.  Patient was also encouraged to follow-up with the lymphedema clinic as directed.   Patient stated understanding of all instructions; and was in agreement with this plan of care. The patient knows to call the clinic with any problems, questions or concerns.   Total time spent with patient was 25 minutes;  with greater than 75 percent of that time spent in face to face counseling regarding patient's symptoms,  and coordination of care and follow  up.  Disclaimer:This dictation was prepared with Dragon/digital dictation along with [employer]. Any transcriptional errors that result from this process are unintentional.  Drue Second, NP 01/06/2016

## 2016-01-06 NOTE — Assessment & Plan Note (Signed)
Patient has a history of chronic right upper extremity lymphedema/edema.  She states that the lymphedema to her right upper extremity has slightly increased within the past few days.  She states that she has been seen by the lymphedema clinic; and continues to visit the lymphedema clinic on a fairly regular basis.  She states that her compression sleeve has become ill-fitting; and she is awaiting the arrival of a new compression sleeve.  Exam of the right upper extremity reveals lymphedema; but no obvious infection.  Patient continues with full range of motion.  Doppler ultrasound of the right upper extremity will be obtained in the morning; for evaluation of DVT.  Previous Doppler ultrasound of the right upper extremity was negative for DVT.  Patient was also encouraged to follow-up with the lymphedema clinic as directed.

## 2016-01-07 ENCOUNTER — Other Ambulatory Visit (HOSPITAL_BASED_OUTPATIENT_CLINIC_OR_DEPARTMENT_OTHER): Payer: Medicare Other

## 2016-01-07 ENCOUNTER — Ambulatory Visit (HOSPITAL_BASED_OUTPATIENT_CLINIC_OR_DEPARTMENT_OTHER): Payer: Medicare Other | Admitting: Oncology

## 2016-01-07 ENCOUNTER — Telehealth: Payer: Self-pay | Admitting: Oncology

## 2016-01-07 ENCOUNTER — Ambulatory Visit: Payer: Medicare Other | Admitting: Physical Therapy

## 2016-01-07 ENCOUNTER — Other Ambulatory Visit: Payer: Self-pay | Admitting: Oncology

## 2016-01-07 VITALS — BP 127/63 | HR 67 | Temp 97.6°F | Resp 18 | Ht 64.0 in | Wt 210.3 lb

## 2016-01-07 DIAGNOSIS — R5381 Other malaise: Secondary | ICD-10-CM | POA: Diagnosis not present

## 2016-01-07 DIAGNOSIS — I82403 Acute embolism and thrombosis of unspecified deep veins of lower extremity, bilateral: Secondary | ICD-10-CM

## 2016-01-07 DIAGNOSIS — I890 Lymphedema, not elsewhere classified: Secondary | ICD-10-CM

## 2016-01-07 DIAGNOSIS — M79601 Pain in right arm: Secondary | ICD-10-CM | POA: Diagnosis not present

## 2016-01-07 DIAGNOSIS — C50411 Malignant neoplasm of upper-outer quadrant of right female breast: Secondary | ICD-10-CM

## 2016-01-07 DIAGNOSIS — I82402 Acute embolism and thrombosis of unspecified deep veins of left lower extremity: Secondary | ICD-10-CM

## 2016-01-07 DIAGNOSIS — M25611 Stiffness of right shoulder, not elsewhere classified: Secondary | ICD-10-CM

## 2016-01-07 DIAGNOSIS — E213 Hyperparathyroidism, unspecified: Secondary | ICD-10-CM

## 2016-01-07 LAB — CBC WITH DIFFERENTIAL/PLATELET
BASO%: 1.2 % (ref 0.0–2.0)
BASOS ABS: 0.1 10*3/uL (ref 0.0–0.1)
EOS ABS: 0.1 10*3/uL (ref 0.0–0.5)
EOS%: 2.2 % (ref 0.0–7.0)
HEMATOCRIT: 43.4 % (ref 34.8–46.6)
HEMOGLOBIN: 14.4 g/dL (ref 11.6–15.9)
LYMPH#: 1.6 10*3/uL (ref 0.9–3.3)
LYMPH%: 25.3 % (ref 14.0–49.7)
MCH: 29.6 pg (ref 25.1–34.0)
MCHC: 33.1 g/dL (ref 31.5–36.0)
MCV: 89.5 fL (ref 79.5–101.0)
MONO#: 0.8 10*3/uL (ref 0.1–0.9)
MONO%: 13.4 % (ref 0.0–14.0)
NEUT#: 3.6 10*3/uL (ref 1.5–6.5)
NEUT%: 57.9 % (ref 38.4–76.8)
Platelets: 198 10*3/uL (ref 145–400)
RBC: 4.85 10*6/uL (ref 3.70–5.45)
RDW: 14.2 % (ref 11.2–14.5)
WBC: 6.2 10*3/uL (ref 3.9–10.3)

## 2016-01-07 LAB — COMPREHENSIVE METABOLIC PANEL
ALT: 17 U/L (ref 0–55)
AST: 16 U/L (ref 5–34)
Albumin: 3.7 g/dL (ref 3.5–5.0)
Alkaline Phosphatase: 82 U/L (ref 40–150)
Anion Gap: 10 mEq/L (ref 3–11)
BUN: 17.7 mg/dL (ref 7.0–26.0)
CHLORIDE: 107 meq/L (ref 98–109)
CO2: 23 meq/L (ref 22–29)
CREATININE: 0.8 mg/dL (ref 0.6–1.1)
Calcium: 10.6 mg/dL — ABNORMAL HIGH (ref 8.4–10.4)
EGFR: 75 mL/min/{1.73_m2} — ABNORMAL LOW (ref >90)
GLUCOSE: 99 mg/dL (ref 70–140)
POTASSIUM: 4 meq/L (ref 3.5–5.1)
SODIUM: 141 meq/L (ref 136–145)
Total Bilirubin: 0.32 mg/dL (ref 0.20–1.20)
Total Protein: 6.9 g/dL (ref 6.4–8.3)

## 2016-01-07 LAB — PROTIME-INR
INR: 2.8 (ref 2.00–3.50)
PROTIME: 33.6 s — AB (ref 10.6–13.4)

## 2016-01-07 MED ORDER — ANASTROZOLE 1 MG PO TABS: 1.0000 mg | ORAL_TABLET | Freq: Every day | ORAL | 4 refills | Status: DC

## 2016-01-07 NOTE — Therapy (Signed)
[place], [name], [address] Phone: [phone]   Fax:  [phone]  Physical Therapy Treatment  Patient Details  Name: Alicia Vasquez MRN: [id number] Date of Birth: 05-07-1941 Referring Provider: Dr.Magrinat  Encounter Date: 01/07/2016      PT End of Session - 01/07/16 1745    Visit Number 9   Number of Visits 9   Date for PT Re-Evaluation 01/08/16   PT Start Time [id number]   PT Stop Time 1345   PT Time Calculation (min) 40 min   Activity Tolerance Patient tolerated treatment well   Behavior During Therapy [facility] for tasks assessed/performed      Past Medical History:  Diagnosis Date  . Allergy   . Anxiety   . Arthritis   . Blood transfusion without reported diagnosis   . Breast cancer ([place]) 07/12/13   invasive mammary carcinoma  . DVT (deep vein thrombosis) in pregnancy   . Hypertension   . Hypothyroid   . Pneumonia   . Radiation 11/21/13-01/07/14   Right Breast/supraclav.    Past Surgical History:  Procedure Laterality Date  . biospy     female organs  . BREAST LUMPECTOMY WITH RADIOACTIVE SEED LOCALIZATION Right 09/09/2013   Procedure: BREAST LUMPECTOMY WITH RADIOACTIVE SEED LOCALIZATION WITH AXILLARY NODE EXCISION;  Surgeon: [name], MD;  Location: [facility];  Service: General;  Laterality: Right;  . DILATION AND CURETTAGE OF UTERUS    . RE-EXCISION OF BREAST LUMPECTOMY Right 09/24/2013   Procedure: RE-EXCISION OF RIGHT BREAST LUMPECTOMY;  Surgeon: [name], MD;  Location: [facility];  Service: General;  Laterality: Right;  . removal of teeth  04/19/2012   13 teeth removed    There were no vitals filed for this visit.      Subjective Assessment - 01/07/16 1306    Subjective pt comes in with compression sleeve.gauntlet and farrow wrap.  " believe it or not it feels better with the sleeve."    Pertinent History breast cancer in 2015 with  lumpectomy and radiation.  Multiple complaints of pain in back, legs, neck shoulder and arm. Pt reports she has not followed up with community exercise progra    Patient Stated Goals to get help with pain and swelling    Currently in Pain? Yes   Pain Score --  did not rate today                          [facility] Adult PT Treatment/Exercise - 01/07/16 0001      Self-Care   Other Self-Care Comments  Pt comes in today with size medium farrow wrap arm sleeve and size small farrow wrap gauntlet and both were tried on .  The arm sleeve appears to be too small for patient and she has diffuclty putting it on.  ( the one requested for per our measuarement should have been a size large)  Pt then applied ther jobst bella strong arm sleeve with silky donning assist and adjusted with gloves.  She was able to do this by herself on second try.  She also got a gauntlet that she was able to apply. Pt pleased with compression sleeve,and gauntlet                         Long Term Clinic Goals - 12/24/15 1747      CC Long Term Goal  #1  Title Pt will have a reduction in right forearm at 15 cm above the ulnar styloid by 1 cm   Baseline 27.5 on 12/08/2015, 27 on 12/24/2015   Status On-going     CC Long Term Goal  #2   Title Pt will have/know how to get  a compression garment that she is able to use to control her lymphedema    Status Achieved            Plan - 01/07/16 1746    Clinical Impression Statement Pt is able to don her compression sleeve and gauntlet with silky donning assist, with only minor assist needed to make sure upper arm tissue is in sleeve. No problem with the gauntlet.  Farrow wrap appears to be the wrong size; will make a call to [facility]. Pt instructed that cannot wear the compression sleeve at night and that the Farrow wrap is to help control her arm when she feels that it is more swollen    PT Next Visit Plan final check to see how compression  garments are working out, discharge       Patient will benefit from skilled therapeutic intervention in order to improve the following deficits and impairments:     Visit Diagnosis: Lymphedema, not elsewhere classified  Pain In Right Arm  Stiffness of right shoulder, not elsewhere classified     Problem List Patient Active Problem List   Diagnosis Date Noted  . Lymphedema 09/17/2015  . Long term current use of anticoagulant therapy 08/23/2015  . DVT, lower extremity ([place]) 06/18/2015  . Bilateral knee pain 04/03/2015  . Arm edema 08/28/2014  . Vitamin D deficiency 04/23/2014  . Hyperparathyroidism ([place]) 04/23/2014  . Depression 07/18/2013  . Breast cancer of upper-outer quadrant of right female breast ([place]) 07/15/2013  . Encounter for therapeutic drug monitoring 07/05/2013  . Overactive bladder 01/30/2013  . DVT (deep venous thrombosis), left 12/19/2012  . HTN (hypertension) 09/27/2011  . Hearing loss 09/27/2011  . Chest wall pain 09/27/2011  . Seasonal allergies 09/27/2011  . SOB (shortness of breath), related to deconditioning 08/26/2011  . Hypothyroid 08/26/2011   [name]. Owens Shark PT  [name] 01/07/2016, 5:54 PM  [place], [name], [address] Phone: [phone]   Fax:  [phone]  Name: Alicia Vasquez MRN: [id number] Date of Birth: 1940/11/23

## 2016-01-07 NOTE — Telephone Encounter (Signed)
appt made and avs printed °
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
## 2016-01-19 ENCOUNTER — Ambulatory Visit: Payer: Medicare Other | Admitting: Physical Therapy

## 2016-01-19 DIAGNOSIS — M25611 Stiffness of right shoulder, not elsewhere classified: Secondary | ICD-10-CM | POA: Diagnosis not present

## 2016-01-19 DIAGNOSIS — I890 Lymphedema, not elsewhere classified: Secondary | ICD-10-CM

## 2016-01-19 DIAGNOSIS — M79601 Pain in right arm: Secondary | ICD-10-CM

## 2016-01-19 DIAGNOSIS — R5381 Other malaise: Secondary | ICD-10-CM | POA: Diagnosis not present

## 2016-01-19 NOTE — Therapy (Signed)
[place], [name], [address] Phone: [phone]   Fax:  [phone]  Physical Therapy Treatment  Patient Details  Name: Alicia Vasquez MRN: [id number] Date of Birth: 1940-11-30 Referring Provider: Dr.Magrinat  Encounter Date: 01/19/2016      PT End of Session - 01/19/16 1639    Visit Number 10   Number of Visits 9   Date for PT Re-Evaluation 01/08/16   PT Start Time 1405  arrived late due to traffic on [address]    PT Stop Time 1430   PT Time Calculation (min) 25 min      Past Medical History:  Diagnosis Date  . Allergy   . Anxiety   . Arthritis   . Blood transfusion without reported diagnosis   . Breast cancer ([place]) 07/12/13   invasive mammary carcinoma  . DVT (deep vein thrombosis) in pregnancy   . Hypertension   . Hypothyroid   . Pneumonia   . Radiation 11/21/13-01/07/14   Right Breast/supraclav.    Past Surgical History:  Procedure Laterality Date  . biospy     female organs  . BREAST LUMPECTOMY WITH RADIOACTIVE SEED LOCALIZATION Right 09/09/2013   Procedure: BREAST LUMPECTOMY WITH RADIOACTIVE SEED LOCALIZATION WITH AXILLARY NODE EXCISION;  Surgeon: [name], MD;  Location: [facility];  Service: General;  Laterality: Right;  . DILATION AND CURETTAGE OF UTERUS    . RE-EXCISION OF BREAST LUMPECTOMY Right 09/24/2013   Procedure: RE-EXCISION OF RIGHT BREAST LUMPECTOMY;  Surgeon: [name], MD;  Location: [facility];  Service: General;  Laterality: Right;  . removal of teeth  04/19/2012   13 teeth removed    There were no vitals filed for this visit.      Subjective Assessment - 01/19/16 1410    Subjective pt states she has pain, the same pain as she has before she started the anastrozole. She has been wearing her sleeve everyday after she takes a shower, but she has not worn the farrow  wrap.  She got a message that her other gauntlet is  in and she plans to go pick it up today She will ask if they will switch our her farrow wrap to a size large today    Pertinent History breast cancer in 2015 with lumpectomy and radiation.  Multiple complaints of pain in back, l11egs, neck shoulder and arm. Pt reports she has not followed up with community exercise program   Patient Stated Goals to get help with pain and swelling    Currently in Pain? Yes   Pain Score 5    Pain Location Axilla   Pain Orientation Right   Pain Radiating Towards under arm and across chest.    Aggravating Factors  more activity    Pain Relieving Factors tylenol                LYMPHEDEMA/ONCOLOGY QUESTIONNAIRE - 01/19/16 1416      Right Upper Extremity Lymphedema   15 cm Proximal to Olecranon Process 36 cm   10 cm Proximal to Olecranon Process 34.5 cm   Olecranon Process 28.2 cm   15 cm Proximal to Ulnar Styloid Process 26.6 cm   10 cm Proximal to Ulnar Styloid Process 23.7 cm   Just Proximal to Ulnar Styloid Process 17 cm   Across Hand at [employer] 19.5 cm   At [place] of 2nd Digit 6.5 cm  [facility] Adult PT Treatment/Exercise - 2016/02/04 0001      Self-Care   Other Self-Care Comments  Pt was able to apply her compression sleeve independently except for min assist needed to pull the sleeve up the top of the back of her arm  Also discussed use of elevation and exercise to help decrease hand swelling.  She plans to start the [employer] program on Sept. 18.                         Long Term Clinic Goals - 02-04-16 1425      CC Long Term Goal  #1   Title Pt will have a reduction in right forearm at 15 cm above the ulnar styloid by 1 cm   Baseline 27.5 on 12/08/2015, 27 on 12/24/2015, 26.6 on on 01/18/2016   Status Partially Met     CC Long Term Goal  #2   Title Pt will have/know how to get  a compression garment that she is able to use to control her lymphedema    Status Achieved            Plan -  2016-02-04 1631    Clinical Impression Statement Ms. Fisch reports she  has been wearing her compression sleeve.  She is able to apply it with only min assist to pull it up the back of her arm and it appears to fit well. She has had continued reduction especially in her forearm, but has mild increase in her wrist and hand. She admits to not wearing her gauntlet consistently. She was advised to elevate and exercise her hand and consider trying some gloves she has at home if needed to avoid the cost of compression glove if she can.She feels that she can now manage her lymphedema at home with the sleeve and the farrow wrap is swelling increases.  She plans to start the [employer] program and is hopeful more general strength will help her manage her chronic pain.    Clinical Impairments Affecting Rehab Potential previous radiation, chronic pain limiting her activities    PT Next Visit Plan final check to see how compression garments are working out, discharge    Consulted and Agree with Plan of Care Patient      Patient will benefit from skilled therapeutic intervention in order to improve the following deficits and impairments:     Visit Diagnosis: Lymphedema, not elsewhere classified  Pain In Right Arm  Stiffness of right shoulder, not elsewhere classified       G-Codes - 02/04/2016 1639    Functional Assessment Tool Used clinical judgement   Functional Limitation Self care   Self Care Goal Status (E3329) At least 20 percent but less than 40 percent impaired, limited or restricted   Self Care Discharge Status [phone]) At least 20 percent but less than 40 percent impaired, limited or restricted      Problem List Patient Active Problem List   Diagnosis Date Noted  . Lymphedema 09/17/2015  . Long term current use of anticoagulant therapy 08/23/2015  . DVT, lower extremity ([place]) 06/18/2015  . Bilateral knee pain 04/03/2015  . Arm edema 08/28/2014  . Vitamin D deficiency 04/23/2014  .  Hyperparathyroidism ([place]) 04/23/2014  . Depression 07/18/2013  . Breast cancer of upper-outer quadrant of right female breast ([place]) 07/15/2013  . Encounter for therapeutic drug monitoring 07/05/2013  . Overactive bladder 01/30/2013  . DVT (deep venous thrombosis), left 12/19/2012  . HTN (hypertension)  09/27/2011  . Hearing loss 09/27/2011  . Chest wall pain 09/27/2011  . Seasonal allergies 09/27/2011  . SOB (shortness of breath), related to deconditioning 08/26/2011  . Hypothyroid 08/26/2011    PHYSICAL THERAPY DISCHARGE SUMMARY  Visits from Start of Care: 10  Current functional level related to goals / functional outcomes: Pt has compression garments to manage lymphedema    Remaining deficits: Lymphedema, chronic pain   Education / Equipment: Use of compression garments  Plan: Patient agrees to discharge.  Patient goals were partially met. Patient is being discharged due to being pleased with the current functional level.  ?????         [name]. Owens Shark PT  [name] 01/19/2016, 4:43 PM  [place] [place], [name], [address] Phone: [phone]   Fax:  [phone]  Name: [name] LEHMKUHL MRN: [id number] Date of Birth: 1940-12-05

## 2016-01-21 ENCOUNTER — Other Ambulatory Visit (HOSPITAL_BASED_OUTPATIENT_CLINIC_OR_DEPARTMENT_OTHER): Payer: Medicare Other

## 2016-01-21 DIAGNOSIS — I82403 Acute embolism and thrombosis of unspecified deep veins of lower extremity, bilateral: Secondary | ICD-10-CM | POA: Diagnosis present

## 2016-01-21 DIAGNOSIS — C50411 Malignant neoplasm of upper-outer quadrant of right female breast: Secondary | ICD-10-CM

## 2016-01-21 DIAGNOSIS — I82402 Acute embolism and thrombosis of unspecified deep veins of left lower extremity: Secondary | ICD-10-CM

## 2016-01-21 LAB — PROTIME-INR
INR: 2.3 (ref 2.00–3.50)
PROTIME: 27.6 s — AB (ref 10.6–13.4)

## 2016-01-22 ENCOUNTER — Telehealth: Payer: Self-pay

## 2016-01-23 NOTE — Telephone Encounter (Signed)
Thank you, I have called patient to advise her to inquire about this with Dr Jana Hakim. Left message for her to call me back

## 2016-01-23 NOTE — Telephone Encounter (Signed)
Alicia Vasquez is not a med normally prescribed by our office,but you sent in last rx and sig indicates take as directed by coumadin clinic, are you going to continue to send, or should she call coumadin clinic ?

## 2016-01-23 NOTE — Telephone Encounter (Signed)
It looks like someone else is managing this for her now per Epic. Philis Fendt, MS, PA-C 9:38 AM, 01/23/2016

## 2016-01-26 ENCOUNTER — Other Ambulatory Visit: Payer: Self-pay

## 2016-01-26 DIAGNOSIS — M545 Low back pain, unspecified: Secondary | ICD-10-CM

## 2016-01-26 DIAGNOSIS — E038 Other specified hypothyroidism: Secondary | ICD-10-CM

## 2016-01-26 DIAGNOSIS — M542 Cervicalgia: Secondary | ICD-10-CM

## 2016-01-26 MED ORDER — LEVOTHYROXINE SODIUM 125 MCG PO TABS: 125.0000 ug | ORAL_TABLET | Freq: Every day | ORAL | 0 refills | Status: DC

## 2016-01-26 NOTE — Telephone Encounter (Signed)
At this time patient has not been authorized to make an appointment with [facility], until further arrangements can be made on her high bad debit and self-pay balance.     Would we be able to refill patient's prescriptions for one month, until patient can talk to Mickel Baas to make payment arrangements.  Here are the medications the patient will be out of in the next couple of days.::   levothyroxine (SYNTHROID) 125 MCG tablet lisinopril (PRINIVIL,ZESTRIL) 10 MG tablet HYDROcodone-acetaminophen ([place]) 5-325 MG tablet   Pharmacy: [facility] [address], [place]    Please call patient to let her know the status of medication refills at [phone].     Also patient has been notified that [facility] will not refill the Jantoven medication, since the [facility] providers are following that medication and treatment.

## 2016-01-26 NOTE — Telephone Encounter (Signed)
Pt should have a 90 day RF on file at [facility] for lisinopril sent on 5/11. I will send in a 1 mos RF for levothyroxine per protocol. Please advise on the hydrocodone. Pt last seen for back pain 6/14 and was given #30 at that time.

## 2016-01-27 ENCOUNTER — Other Ambulatory Visit: Payer: Self-pay

## 2016-01-27 DIAGNOSIS — I82409 Acute embolism and thrombosis of unspecified deep veins of unspecified lower extremity: Secondary | ICD-10-CM

## 2016-01-27 DIAGNOSIS — I82402 Acute embolism and thrombosis of unspecified deep veins of left lower extremity: Secondary | ICD-10-CM

## 2016-01-27 DIAGNOSIS — Z86711 Personal history of pulmonary embolism: Secondary | ICD-10-CM

## 2016-01-27 MED ORDER — WARFARIN SODIUM 5 MG PO TABS: ORAL_TABLET | ORAL | 3 refills | Status: DC

## 2016-01-27 NOTE — Telephone Encounter (Signed)
Told Ms Seder that her Ervin Knack prescription was sent to [facility] as requested with 3 additional refills.

## 2016-02-03 NOTE — Telephone Encounter (Signed)
Advised pt of lisinopril and levothyroxine RFs at pharm, and that hydrocodone can not be filled w/out an appt. Pt understood and will try to get in when she can get her bill paid down, or call and talk with an Scientist, physiological.

## 2016-02-18 ENCOUNTER — Other Ambulatory Visit (HOSPITAL_BASED_OUTPATIENT_CLINIC_OR_DEPARTMENT_OTHER): Payer: Medicare Other

## 2016-02-18 DIAGNOSIS — I82403 Acute embolism and thrombosis of unspecified deep veins of lower extremity, bilateral: Secondary | ICD-10-CM | POA: Diagnosis present

## 2016-02-18 DIAGNOSIS — I82402 Acute embolism and thrombosis of unspecified deep veins of left lower extremity: Secondary | ICD-10-CM

## 2016-02-18 DIAGNOSIS — C50411 Malignant neoplasm of upper-outer quadrant of right female breast: Secondary | ICD-10-CM

## 2016-02-18 LAB — PROTIME-INR
INR: 1.8 — ABNORMAL LOW (ref 2.00–3.50)
PROTIME: 21.6 s — AB (ref 10.6–13.4)

## 2016-02-24 ENCOUNTER — Other Ambulatory Visit: Payer: Self-pay | Admitting: Family Medicine

## 2016-02-24 DIAGNOSIS — E038 Other specified hypothyroidism: Secondary | ICD-10-CM

## 2016-02-26 ENCOUNTER — Telehealth: Payer: Self-pay | Admitting: *Deleted

## 2016-02-26 NOTE — Telephone Encounter (Signed)
INR 1.8 on 9/21 with no change in current dose of coumadin - recheck as scheduled.

## 2016-03-02 ENCOUNTER — Telehealth: Payer: Self-pay | Admitting: Internal Medicine

## 2016-03-02 NOTE — Telephone Encounter (Signed)
Patient need refill of levothyroxine (SYNTHROID) 125 MCG tablet  [facility] [address] [place] [phone] (Phone) [phone] (Fax)

## 2016-03-04 ENCOUNTER — Other Ambulatory Visit: Payer: Self-pay

## 2016-03-04 DIAGNOSIS — E038 Other specified hypothyroidism: Secondary | ICD-10-CM

## 2016-03-04 MED ORDER — LEVOTHYROXINE SODIUM 125 MCG PO TABS: 125.0000 ug | ORAL_TABLET | Freq: Every day | ORAL | 0 refills | Status: DC

## 2016-03-04 NOTE — Telephone Encounter (Signed)
Pt needs synthyroid called in asap please to [facility]

## 2016-03-17 ENCOUNTER — Other Ambulatory Visit (HOSPITAL_BASED_OUTPATIENT_CLINIC_OR_DEPARTMENT_OTHER): Payer: Medicare Other

## 2016-03-17 ENCOUNTER — Ambulatory Visit (HOSPITAL_BASED_OUTPATIENT_CLINIC_OR_DEPARTMENT_OTHER): Payer: Medicare Other | Admitting: Oncology

## 2016-03-17 VITALS — BP 109/48 | HR 79 | Temp 97.9°F | Resp 19 | Ht 64.0 in | Wt 211.1 lb

## 2016-03-17 DIAGNOSIS — C50411 Malignant neoplasm of upper-outer quadrant of right female breast: Secondary | ICD-10-CM

## 2016-03-17 DIAGNOSIS — Z170 Estrogen receptor positive status [ER+]: Secondary | ICD-10-CM

## 2016-03-17 DIAGNOSIS — E213 Hyperparathyroidism, unspecified: Secondary | ICD-10-CM

## 2016-03-17 DIAGNOSIS — I82402 Acute embolism and thrombosis of unspecified deep veins of left lower extremity: Secondary | ICD-10-CM

## 2016-03-17 DIAGNOSIS — I82403 Acute embolism and thrombosis of unspecified deep veins of lower extremity, bilateral: Secondary | ICD-10-CM

## 2016-03-17 LAB — CBC WITH DIFFERENTIAL/PLATELET
BASO%: 1.3 % (ref 0.0–2.0)
BASOS ABS: 0.1 10*3/uL (ref 0.0–0.1)
EOS ABS: 0.1 10*3/uL (ref 0.0–0.5)
EOS%: 2.3 % (ref 0.0–7.0)
HEMATOCRIT: 44.2 % (ref 34.8–46.6)
HGB: 14.5 g/dL (ref 11.6–15.9)
LYMPH#: 1.5 10*3/uL (ref 0.9–3.3)
LYMPH%: 23.9 % (ref 14.0–49.7)
MCH: 30.2 pg (ref 25.1–34.0)
MCHC: 32.8 g/dL (ref 31.5–36.0)
MCV: 92.1 fL (ref 79.5–101.0)
MONO#: 0.8 10*3/uL (ref 0.1–0.9)
MONO%: 12.6 % (ref 0.0–14.0)
NEUT#: 3.7 10*3/uL (ref 1.5–6.5)
NEUT%: 59.9 % (ref 38.4–76.8)
PLATELETS: 203 10*3/uL (ref 145–400)
RBC: 4.79 10*6/uL (ref 3.70–5.45)
RDW: 14.4 % (ref 11.2–14.5)
WBC: 6.1 10*3/uL (ref 3.9–10.3)

## 2016-03-17 LAB — COMPREHENSIVE METABOLIC PANEL
ALT: 20 U/L (ref 0–55)
ANION GAP: 10 meq/L (ref 3–11)
AST: 18 U/L (ref 5–34)
Albumin: 3.9 g/dL (ref 3.5–5.0)
Alkaline Phosphatase: 89 U/L (ref 40–150)
BUN: 19.3 mg/dL (ref 7.0–26.0)
CALCIUM: 10.8 mg/dL — AB (ref 8.4–10.4)
CHLORIDE: 106 meq/L (ref 98–109)
CO2: 25 mEq/L (ref 22–29)
CREATININE: 1.3 mg/dL — AB (ref 0.6–1.1)
EGFR: 41 mL/min/{1.73_m2} — AB (ref >90)
Glucose: 121 mg/dl (ref 70–140)
Potassium: 4.1 mEq/L (ref 3.5–5.1)
Sodium: 141 mEq/L (ref 136–145)
Total Bilirubin: 0.32 mg/dL (ref 0.20–1.20)
Total Protein: 7.1 g/dL (ref 6.4–8.3)

## 2016-03-17 LAB — PROTIME-INR
INR: 3.4 (ref 2.00–3.50)
PROTIME: 40.8 s — AB (ref 10.6–13.4)

## 2016-03-17 MED ORDER — LISINOPRIL 10 MG PO TABS: ORAL_TABLET | ORAL | 1 refills | Status: DC
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
## 2016-03-24 ENCOUNTER — Other Ambulatory Visit (HOSPITAL_BASED_OUTPATIENT_CLINIC_OR_DEPARTMENT_OTHER): Payer: Medicare Other

## 2016-03-24 DIAGNOSIS — I82402 Acute embolism and thrombosis of unspecified deep veins of left lower extremity: Secondary | ICD-10-CM

## 2016-03-24 DIAGNOSIS — C50411 Malignant neoplasm of upper-outer quadrant of right female breast: Secondary | ICD-10-CM

## 2016-03-24 DIAGNOSIS — I82403 Acute embolism and thrombosis of unspecified deep veins of lower extremity, bilateral: Secondary | ICD-10-CM

## 2016-03-24 LAB — PROTIME-INR
INR: 1.9 — AB (ref 2.00–3.50)
PROTIME: 22.8 s — AB (ref 10.6–13.4)

## 2016-03-25 ENCOUNTER — Telehealth: Payer: Self-pay

## 2016-03-25 NOTE — Telephone Encounter (Signed)
[facility] - INR.  Pt to remain on same coumadin dose.  Recheck labs in 2 weeks.  Pt to call clinic to make appt for lab.

## 2016-03-30 ENCOUNTER — Telehealth: Payer: Self-pay | Admitting: *Deleted

## 2016-03-30 NOTE — Telephone Encounter (Signed)
FYI "I missed a call from someone about a lab appointment.  I was told to call Monday to schedule lab.  I've continued the same dose coumadin but need lab (INR) checked again on 04-07-2016."  Call lost.  Returned call to patient.  Tried scheduling call transfer.  This nurse left message with patient's request for scheduler to call patient with new lab appointment and scheduling message sent.

## 2016-04-01 ENCOUNTER — Other Ambulatory Visit: Payer: Self-pay | Admitting: Internal Medicine

## 2016-04-01 DIAGNOSIS — E038 Other specified hypothyroidism: Secondary | ICD-10-CM

## 2016-04-05 ENCOUNTER — Telehealth: Payer: Self-pay | Admitting: Internal Medicine

## 2016-04-05 DIAGNOSIS — E038 Other specified hypothyroidism: Secondary | ICD-10-CM

## 2016-04-05 NOTE — Telephone Encounter (Signed)
Patient need a refill of levothyroxine (SYNTHROID) 125 MCG tablet.    [facility] [address] [place] [phone] (Phone) [phone] (Fax)

## 2016-04-07 ENCOUNTER — Other Ambulatory Visit (HOSPITAL_BASED_OUTPATIENT_CLINIC_OR_DEPARTMENT_OTHER): Payer: Medicare Other

## 2016-04-07 DIAGNOSIS — I82403 Acute embolism and thrombosis of unspecified deep veins of lower extremity, bilateral: Secondary | ICD-10-CM

## 2016-04-07 DIAGNOSIS — C50411 Malignant neoplasm of upper-outer quadrant of right female breast: Secondary | ICD-10-CM

## 2016-04-07 DIAGNOSIS — I82402 Acute embolism and thrombosis of unspecified deep veins of left lower extremity: Secondary | ICD-10-CM

## 2016-04-07 LAB — PROTIME-INR
INR: 2.6 (ref 2.00–3.50)
PROTIME: 31.2 s — AB (ref 10.6–13.4)

## 2016-04-07 MED ORDER — LEVOTHYROXINE SODIUM 125 MCG PO TABS: 125.0000 ug | ORAL_TABLET | Freq: Every day | ORAL | 0 refills | Status: DC

## 2016-04-07 NOTE — Telephone Encounter (Signed)
Refill submitted per patient's request.  

## 2016-04-07 NOTE — Telephone Encounter (Signed)
Pt has only one pill left of the levothyroxine please call to [facility]

## 2016-04-08 ENCOUNTER — Ambulatory Visit: Payer: Medicare Other | Admitting: Internal Medicine

## 2016-04-15 ENCOUNTER — Ambulatory Visit (HOSPITAL_COMMUNITY)
Admission: EM | Admit: 2016-04-15 | Discharge: 2016-04-15 | Disposition: A | Discharge status: Home / Self Care | Payer: Medicare Other | Attending: Family Medicine | Admitting: Family Medicine

## 2016-04-15 ENCOUNTER — Encounter (HOSPITAL_COMMUNITY): Payer: Self-pay | Admitting: *Deleted

## 2016-04-15 DIAGNOSIS — E038 Other specified hypothyroidism: Secondary | ICD-10-CM

## 2016-04-15 DIAGNOSIS — M5432 Sciatica, left side: Secondary | ICD-10-CM | POA: Diagnosis not present

## 2016-04-15 MED ORDER — HYDROCODONE-ACETAMINOPHEN 5-325 MG PO TABS: 1.0000 | ORAL_TABLET | Freq: Every day | ORAL | 0 refills | Status: DC | PRN

## 2016-04-15 MED ORDER — LEVOTHYROXINE SODIUM 125 MCG PO TABS: 125.0000 ug | ORAL_TABLET | Freq: Every day | ORAL | 3 refills | Status: DC

## 2016-04-15 MED ORDER — METHYLPREDNISOLONE 4 MG PO TBPK: ORAL_TABLET | ORAL | 0 refills | Status: DC

## 2016-04-15 NOTE — ED Provider Notes (Signed)
[facility]    CSN: [id number] Arrival date & time: 04/15/16  1446     History   Chief Complaint Chief Complaint  Patient presents with  . Back Pain    HPI Alicia Vasquez is a 75 y.o. female.   This 75 year old woman with left-sided back pain radiating down her left leg. sHe's had this pain before, this episode started several days ago. The pain is worse when she bends over. She's not had a fever, stomach pain, urinary symptoms, weakness in her leg, or loss of sensation. She does have some tingling on side of her left calf.  Patient also needs refill on her thyroid      Past Medical History:  Diagnosis Date  . Allergy   . Anxiety   . Arthritis   . Blood transfusion without reported diagnosis   . Breast cancer ([facility]) 07/12/13   invasive mammary carcinoma  . DVT (deep vein thrombosis) in pregnancy ([facility])   . Hypertension   . Hypothyroid   . Pneumonia   . Radiation 11/21/13-01/07/14   Right Breast/supraclav.    Patient Active Problem List   Diagnosis Date Noted  . Lymphedema 09/17/2015  . Long term current use of anticoagulant therapy 08/23/2015  . DVT, lower extremity ([facility]) 06/18/2015  . Bilateral knee pain 04/03/2015  . Arm edema 08/28/2014  . Vitamin D deficiency 04/23/2014  . Hyperparathyroidism ([facility]) 04/23/2014  . Depression 07/18/2013  . Breast cancer of upper-outer quadrant of right female breast ([facility]) 07/15/2013  . Encounter for therapeutic drug monitoring 07/05/2013  . Overactive bladder 01/30/2013  . DVT (deep venous thrombosis), left 12/19/2012  . HTN (hypertension) 09/27/2011  . Hearing loss 09/27/2011  . Chest wall pain 09/27/2011  . Seasonal allergies 09/27/2011  . SOB (shortness of breath), related to deconditioning 08/26/2011  . Hypothyroid 08/26/2011    Past Surgical History:  Procedure Laterality Date  . biospy     female organs  . BREAST LUMPECTOMY WITH RADIOACTIVE SEED LOCALIZATION Right 09/09/2013   Procedure: BREAST  LUMPECTOMY WITH RADIOACTIVE SEED LOCALIZATION WITH AXILLARY NODE EXCISION;  Surgeon: [name], MD;  Location: [facility];  Service: General;  Laterality: Right;  . DILATION AND CURETTAGE OF UTERUS    . RE-EXCISION OF BREAST LUMPECTOMY Right 09/24/2013   Procedure: RE-EXCISION OF RIGHT BREAST LUMPECTOMY;  Surgeon: [name], MD;  Location: [facility];  Service: General;  Laterality: Right;  . removal of teeth  04/19/2012   13 teeth removed    OB History    No data available       Home Medications    Prior to Admission medications   Medication Sig Start Date End Date Taking? Authorizing Provider  acetaminophen (TYLENOL) 500 MG tablet Take 500 mg by mouth every 6 (six) hours as needed for mild pain, fever or headache. Reported on 09/08/2015    Historical Provider, MD  anastrozole (ARIMIDEX) 1 MG tablet Take 1 tablet (1 mg total) by mouth daily. 01/07/16   Chauncey Cruel, MD  Cholecalciferol (VITAMIN D3) 5000 UNITS TABS Take by mouth.    Historical Provider, MD  HYDROcodone-acetaminophen (NORCO) 5-325 MG tablet Take 1 tablet by mouth daily as needed for moderate pain. 04/15/16   [name], MD  levothyroxine (SYNTHROID) 125 MCG tablet Take 1 tablet (125 mcg total) by mouth daily before breakfast. Dispense as written: Synthroid LAST REFILL 04/15/16   [name], MD  lisinopril (PRINIVIL,ZESTRIL) 10 MG tablet TAKE 1 TABLET (10 MG TOTAL)  BY MOUTH DAILY. 03/17/16   Chauncey Cruel, MD  methylPREDNISolone (MEDROL DOSEPAK) 4 MG TBPK tablet One bid 04/15/16   [name], MD  warfarin (COUMADIN) 5 MG tablet Take 7.5 mg on M,W,F,Sun, and 10 mg on all other days. 12/25/15   Tereasa Coop, PA-C    Family History Family History  Problem Relation Age of Onset  . Cancer Brother     prostate  . Heart disease Brother   . Colon cancer Brother     Social History Social History  Substance Use Topics  . Smoking status: Never Smoker  .  Smokeless tobacco: Never Used  . Alcohol use No     Allergies   Anesthetics, amide; Benadryl [diphenhydramine hcl]; Carbocaine [mepivacaine hcl]; Codeine; Epinephrine; Sulfa antibiotics; Vicodin [hydrocodone-acetaminophen]; and Penicillins   Review of Systems Review of Systems  Constitutional: Negative.   HENT: Negative.   Eyes: Negative.   Respiratory: Negative.   Cardiovascular: Positive for chest pain.  Genitourinary: Negative.   Musculoskeletal: Positive for back pain.  Neurological: Negative.      Physical Exam Triage Vital Signs ED Triage Vitals  Enc Vitals Group     BP 04/15/16 1515 158/72     Pulse Rate 04/15/16 1515 72     Resp 04/15/16 1515 12     Temp 04/15/16 1515 97.8 F (36.6 C)     Temp Source 04/15/16 1515 Oral     SpO2 04/15/16 1515 98 %     Weight --      Height --      Head Circumference --      Peak Flow --      Pain Score 04/15/16 1534 7     Pain Loc --      Pain Edu? --      Excl. in [place]? --    No data found.   Updated Vital Signs BP 158/72 (BP Location: Left Arm)   Pulse 72   Temp 97.8 F (36.6 C) (Oral)   Resp 12   SpO2 98%    Physical Exam  Constitutional: She is oriented to person, place, and time. She appears well-developed and well-nourished.  HENT:  Head: Normocephalic.  Right Ear: External ear normal.  Left Ear: External ear normal.  Nose: Nose normal.  Mouth/Throat: Oropharynx is clear and moist.  Eyes: Conjunctivae are normal. Pupils are equal, round, and reactive to light.  Neck: Normal range of motion. Neck supple. No thyromegaly present.  Cardiovascular: Normal rate.   Musculoskeletal:  Patient has pain with straight leg raising on the left. She is mildly tender on the superior aspect of her posterior superior iliac crest.  Lymphadenopathy:    She has no cervical adenopathy.  Neurological: She is alert and oriented to person, place, and time. No cranial nerve deficit. She exhibits normal muscle tone. Coordination  normal.  Skin: Skin is warm and dry.  Nursing note and vitals reviewed.    UC Treatments / Results  Labs (all labs ordered are listed, but only abnormal results are displayed) Labs Reviewed - No data to display  EKG  EKG Interpretation None       Radiology No results found.  Procedures Procedures (including critical care time)  Medications Ordered in UC Medications - No data to display   Initial Impression / Assessment and Plan / UC Course  I have reviewed the triage vital signs and the nursing notes.  Pertinent labs & imaging results that were available during my care of the  patient were reviewed by me and considered in my medical decision making (see chart for details).  Clinical Course      Final Clinical Impressions(s) / UC Diagnoses   Final diagnoses:  Sciatica of left side    New Prescriptions Discharge Medication List as of 04/15/2016  3:23 PM    START taking these medications   Details  HYDROcodone-acetaminophen (NORCO) 5-325 MG tablet Take 1 tablet by mouth daily as needed for moderate pain., Starting Fri 04/15/2016, Print    methylPREDNISolone (MEDROL DOSEPAK) 4 MG TBPK tablet One bid, Print         [name], MD 04/15/16 1542

## 2016-04-15 NOTE — ED Triage Notes (Signed)
Back  Pain  X  3  Days    Pain  Radiates  Down l  Leg       denys  Any  injury

## 2016-04-19 ENCOUNTER — Ambulatory Visit (HOSPITAL_COMMUNITY)
Admission: EM | Admit: 2016-04-19 | Discharge: 2016-04-19 | Disposition: A | Discharge status: Home / Self Care | Payer: Medicare Other | Attending: Family Medicine | Admitting: Family Medicine

## 2016-04-19 ENCOUNTER — Ambulatory Visit (INDEPENDENT_AMBULATORY_CARE_PROVIDER_SITE_OTHER): Payer: Medicare Other

## 2016-04-19 ENCOUNTER — Encounter (HOSPITAL_COMMUNITY): Payer: Self-pay | Admitting: Family Medicine

## 2016-04-19 DIAGNOSIS — M545 Low back pain: Secondary | ICD-10-CM | POA: Diagnosis not present

## 2016-04-19 DIAGNOSIS — M5432 Sciatica, left side: Secondary | ICD-10-CM

## 2016-04-19 LAB — POCT URINALYSIS DIP (DEVICE)
BILIRUBIN URINE: NEGATIVE
Glucose, UA: NEGATIVE mg/dL
HGB URINE DIPSTICK: NEGATIVE
KETONES UR: NEGATIVE mg/dL
Leukocytes, UA: NEGATIVE
NITRITE: NEGATIVE
PH: 7.5 (ref 5.0–8.0)
PROTEIN: NEGATIVE mg/dL
Specific Gravity, Urine: 1.015 (ref 1.005–1.030)
Urobilinogen, UA: 0.2 mg/dL (ref 0.0–1.0)

## 2016-04-19 MED ORDER — GABAPENTIN 100 MG PO CAPS: 100.0000 mg | ORAL_CAPSULE | Freq: Every day | ORAL | 3 refills | Status: DC

## 2016-04-19 NOTE — Discharge Instructions (Signed)
Please call physical therapy department at [phone] to set up a physical therapy evaluation and treatment session.  Let me see you back in the office here in 3 weeks: I'm working December 6 and 7

## 2016-04-19 NOTE — ED Triage Notes (Addendum)
Pt here for left lower back pain radiating down let leg and into foot.

## 2016-04-19 NOTE — ED Provider Notes (Signed)
[place]    CSN: [id number] Arrival date & time: 04/19/16  1545     History   Chief Complaint Chief Complaint  Patient presents with  . Back Pain    HPI Alicia Vasquez is a 75 y.o. female.   Is a 75 year old woman who comes in for follow-up of back pain. She was seen 4 days ago and she is only got minimal benefit from the prednisone. She describes her pain as being in the left lower lumbar region and radiating all the way down to her ankle.  Patient wonders if the pain actually started with an accident last April. She also been doing a lot of vacuuming in recent weeks. She describes the pain as throbbing. It goes away when she lies down but comes right back when she sits up or bears weight.      Past Medical History:  Diagnosis Date  . Allergy   . Anxiety   . Arthritis   . Blood transfusion without reported diagnosis   . Breast cancer ([place]) 07/12/13   invasive mammary carcinoma  . DVT (deep vein thrombosis) in pregnancy ([place])   . Hypertension   . Hypothyroid   . Pneumonia   . Radiation 11/21/13-01/07/14   Right Breast/supraclav.    Patient Active Problem List   Diagnosis Date Noted  . Lymphedema 09/17/2015  . Long term current use of anticoagulant therapy 08/23/2015  . DVT, lower extremity ([place]) 06/18/2015  . Bilateral knee pain 04/03/2015  . Arm edema 08/28/2014  . Vitamin D deficiency 04/23/2014  . Hyperparathyroidism ([place]) 04/23/2014  . Depression 07/18/2013  . Breast cancer of upper-outer quadrant of right female breast ([facility]) 07/15/2013  . Encounter for therapeutic drug monitoring 07/05/2013  . Overactive bladder 01/30/2013  . DVT (deep venous thrombosis), left 12/19/2012  . HTN (hypertension) 09/27/2011  . Hearing loss 09/27/2011  . Chest wall pain 09/27/2011  . Seasonal allergies 09/27/2011  . SOB (shortness of breath), related to deconditioning 08/26/2011  . Hypothyroid 08/26/2011    Past Surgical History:  Procedure Laterality Date   . biospy     female organs  . BREAST LUMPECTOMY WITH RADIOACTIVE SEED LOCALIZATION Right 09/09/2013   Procedure: BREAST LUMPECTOMY WITH RADIOACTIVE SEED LOCALIZATION WITH AXILLARY NODE EXCISION;  Surgeon: [name], MD;  Location: [facility];  Service: General;  Laterality: Right;  . DILATION AND CURETTAGE OF UTERUS    . RE-EXCISION OF BREAST LUMPECTOMY Right 09/24/2013   Procedure: RE-EXCISION OF RIGHT BREAST LUMPECTOMY;  Surgeon: [name], MD;  Location: [facility];  Service: General;  Laterality: Right;  . removal of teeth  04/19/2012   13 teeth removed    OB History    No data available       Home Medications    Prior to Admission medications   Medication Sig Start Date End Date Taking? Authorizing Provider  acetaminophen (TYLENOL) 500 MG tablet Take 500 mg by mouth every 6 (six) hours as needed for mild pain, fever or headache. Reported on 09/08/2015    Historical Provider, MD  anastrozole (ARIMIDEX) 1 MG tablet Take 1 tablet (1 mg total) by mouth daily. 01/07/16   Chauncey Cruel, MD  Cholecalciferol (VITAMIN D3) 5000 UNITS TABS Take by mouth.    Historical Provider, MD  gabapentin (NEURONTIN) 100 MG capsule Take 1 capsule (100 mg total) by mouth at bedtime. 04/19/16   [name], MD  HYDROcodone-acetaminophen (NORCO) 5-325 MG tablet Take 1 tablet by mouth daily  as needed for moderate pain. 04/15/16   [name], MD  levothyroxine (SYNTHROID) 125 MCG tablet Take 1 tablet (125 mcg total) by mouth daily before breakfast. Dispense as written: Synthroid LAST REFILL 04/15/16   [name], MD  lisinopril (PRINIVIL,ZESTRIL) 10 MG tablet TAKE 1 TABLET (10 MG TOTAL) BY MOUTH DAILY. 03/17/16   Chauncey Cruel, MD  warfarin (COUMADIN) 5 MG tablet Take 7.5 mg on M,W,F,Sun, and 10 mg on all other days. 12/25/15   Tereasa Coop, PA-C    Family History Family History  Problem Relation Age of Onset  . Cancer Brother      prostate  . Heart disease Brother   . Colon cancer Brother     Social History Social History  Substance Use Topics  . Smoking status: Never Smoker  . Smokeless tobacco: Never Used  . Alcohol use No     Allergies   Anesthetics, amide; Benadryl [diphenhydramine hcl]; Carbocaine [mepivacaine hcl]; Codeine; Epinephrine; Sulfa antibiotics; Vicodin [hydrocodone-acetaminophen]; and Penicillins   Review of Systems Review of Systems  Constitutional: Negative.   HENT: Negative.   Eyes: Negative.   Respiratory: Negative.   Cardiovascular: Negative.   Musculoskeletal: Positive for arthralgias, back pain and myalgias.  Neurological: Negative.      Physical Exam Triage Vital Signs ED Triage Vitals  Enc Vitals Group     BP      Pulse      Resp      Temp      Temp src      SpO2      Weight      Height      Head Circumference      Peak Flow      Pain Score      Pain Loc      Pain Edu?      Excl. in [place]?    No data found.   Updated Vital Signs BP 178/69   Pulse 69   Temp 97.9 F (36.6 C)   Resp 14   SpO2 98%    Physical Exam  Constitutional: She is oriented to person, place, and time. She appears well-developed and well-nourished.  HENT:  Head: Normocephalic.  Right Ear: External ear normal.  Left Ear: External ear normal.  Mouth/Throat: Oropharynx is clear and moist.  Eyes: Conjunctivae and EOM are normal.  Neck: Normal range of motion. Neck supple. No JVD present.  Abdominal: Soft.  Musculoskeletal: She exhibits no tenderness or deformity.  Slightly antalgic gait favoring right leg.  Uses a cane  Lymphadenopathy:    She has no cervical adenopathy.  Neurological: She is oriented to person, place, and time. No cranial nerve deficit. Coordination normal.  Skin: Skin is warm and dry.  Nursing note and vitals reviewed.    UC Treatments / Results  Labs (all labs ordered are listed, but only abnormal results are displayed) Labs Reviewed  POCT URINALYSIS  DIP (DEVICE)    EKG  EKG Interpretation None       Radiology Dg Lumbar Spine Complete  Result Date: 04/19/2016 CLINICAL DATA:  Low back pain beginning 1 week ago with left-sided sciatica. EXAM: LUMBAR SPINE - COMPLETE 4+ VIEW COMPARISON:  06/21/2012 FINDINGS: There 5 non rib-bearing lumbar type vertebral bodies. The ribs at T12 are hypoplastic. T12 superior endplate compression fracture/ Schmorl's node is unchanged. Lumbar vertebral body heights are preserved without evidence of acute fracture. No pars defects are identified. Trace anterolisthesis of L3 on L4 is unchanged. There is  mild to moderate disc space narrowing at L1-2 which may have slightly progressed, with vacuum disc noted at this level. At most mild disc space narrowing is present from L2-3 to L4-5. Lower lumbar facet arthrosis is noted. There is abdominal aortic atherosclerosis. IMPRESSION: 1. No evidence of acute osseous abnormality. 2. Mild lumbar disc degeneration, mildly progressed from 2014. 3. Aortic atherosclerosis. Electronically Signed   By: Logan Bores M.D.   On: 04/19/2016 16:40    Procedures Procedures (including critical care time)  Medications Ordered in UC Medications - No data to display   Initial Impression / Assessment and Plan / UC Course  I have reviewed the triage vital signs and the nursing notes.  Pertinent labs & imaging results that were available during my care of the patient were reviewed by me and considered in my medical decision making (see chart for details).  Clinical Course     Final Clinical Impressions(s) / UC Diagnoses   Final diagnoses:  Sciatica of left side    New Prescriptions New Prescriptions   GABAPENTIN (NEURONTIN) 100 MG CAPSULE    Take 1 capsule (100 mg total) by mouth at bedtime.     [name], MD 04/19/16 [phone]

## 2016-04-20 ENCOUNTER — Other Ambulatory Visit (HOSPITAL_BASED_OUTPATIENT_CLINIC_OR_DEPARTMENT_OTHER): Payer: Medicare Other

## 2016-04-20 DIAGNOSIS — I82402 Acute embolism and thrombosis of unspecified deep veins of left lower extremity: Secondary | ICD-10-CM

## 2016-04-20 DIAGNOSIS — I82403 Acute embolism and thrombosis of unspecified deep veins of lower extremity, bilateral: Secondary | ICD-10-CM

## 2016-04-20 DIAGNOSIS — C50411 Malignant neoplasm of upper-outer quadrant of right female breast: Secondary | ICD-10-CM

## 2016-04-20 LAB — PROTIME-INR
INR: 1.3 — ABNORMAL LOW (ref 2.00–3.50)
PROTIME: 15.6 s — AB (ref 10.6–13.4)

## 2016-04-27 ENCOUNTER — Other Ambulatory Visit: Payer: Self-pay | Admitting: *Deleted

## 2016-04-27 ENCOUNTER — Other Ambulatory Visit (HOSPITAL_BASED_OUTPATIENT_CLINIC_OR_DEPARTMENT_OTHER): Payer: Medicare Other

## 2016-04-27 DIAGNOSIS — I82403 Acute embolism and thrombosis of unspecified deep veins of lower extremity, bilateral: Secondary | ICD-10-CM

## 2016-04-27 DIAGNOSIS — R600 Localized edema: Secondary | ICD-10-CM

## 2016-04-27 DIAGNOSIS — C50411 Malignant neoplasm of upper-outer quadrant of right female breast: Secondary | ICD-10-CM

## 2016-04-27 DIAGNOSIS — Z7901 Long term (current) use of anticoagulants: Secondary | ICD-10-CM

## 2016-04-27 LAB — PROTIME-INR
INR: 3.7 — ABNORMAL HIGH (ref 2.00–3.50)
PROTIME: 44.4 s — AB (ref 10.6–13.4)

## 2016-05-05 ENCOUNTER — Other Ambulatory Visit (HOSPITAL_BASED_OUTPATIENT_CLINIC_OR_DEPARTMENT_OTHER): Payer: Medicare Other

## 2016-05-05 DIAGNOSIS — I82403 Acute embolism and thrombosis of unspecified deep veins of lower extremity, bilateral: Secondary | ICD-10-CM

## 2016-05-05 DIAGNOSIS — I82402 Acute embolism and thrombosis of unspecified deep veins of left lower extremity: Secondary | ICD-10-CM

## 2016-05-05 DIAGNOSIS — C50411 Malignant neoplasm of upper-outer quadrant of right female breast: Secondary | ICD-10-CM

## 2016-05-05 LAB — PROTIME-INR
INR: 2.6 (ref 2.00–3.50)
Protime: 31.2 Seconds — ABNORMAL HIGH (ref 10.6–13.4)

## 2016-05-16 DIAGNOSIS — H1031 Unspecified acute conjunctivitis, right eye: Secondary | ICD-10-CM | POA: Diagnosis not present

## 2016-05-16 DIAGNOSIS — H5711 Ocular pain, right eye: Secondary | ICD-10-CM | POA: Diagnosis not present

## 2016-05-16 DIAGNOSIS — H2000 Unspecified acute and subacute iridocyclitis: Secondary | ICD-10-CM | POA: Diagnosis not present

## 2016-05-19 ENCOUNTER — Other Ambulatory Visit (HOSPITAL_BASED_OUTPATIENT_CLINIC_OR_DEPARTMENT_OTHER): Payer: Medicare Other

## 2016-05-19 DIAGNOSIS — C50411 Malignant neoplasm of upper-outer quadrant of right female breast: Secondary | ICD-10-CM

## 2016-05-19 DIAGNOSIS — I82402 Acute embolism and thrombosis of unspecified deep veins of left lower extremity: Secondary | ICD-10-CM

## 2016-05-19 DIAGNOSIS — I82403 Acute embolism and thrombosis of unspecified deep veins of lower extremity, bilateral: Secondary | ICD-10-CM

## 2016-05-19 LAB — PROTIME-INR
INR: 3.8 — ABNORMAL HIGH (ref 2.00–3.50)
Protime: 45.6 Seconds — ABNORMAL HIGH (ref 10.6–13.4)

## 2016-05-26 ENCOUNTER — Other Ambulatory Visit: Payer: Self-pay | Admitting: *Deleted

## 2016-05-26 ENCOUNTER — Other Ambulatory Visit (HOSPITAL_BASED_OUTPATIENT_CLINIC_OR_DEPARTMENT_OTHER): Payer: Medicare Other

## 2016-05-26 DIAGNOSIS — I82403 Acute embolism and thrombosis of unspecified deep veins of lower extremity, bilateral: Secondary | ICD-10-CM | POA: Diagnosis present

## 2016-05-26 DIAGNOSIS — D6859 Other primary thrombophilia: Secondary | ICD-10-CM

## 2016-05-26 LAB — PROTIME-INR
INR: 1.8 — ABNORMAL LOW (ref 2.00–3.50)
PROTIME: 21.6 s — AB (ref 10.6–13.4)

## 2016-06-02 DIAGNOSIS — H2513 Age-related nuclear cataract, bilateral: Secondary | ICD-10-CM | POA: Diagnosis not present

## 2016-06-02 DIAGNOSIS — H1033 Unspecified acute conjunctivitis, bilateral: Secondary | ICD-10-CM | POA: Diagnosis not present

## 2016-06-07 ENCOUNTER — Ambulatory Visit (HOSPITAL_COMMUNITY)
Admission: EM | Admit: 2016-06-07 | Discharge: 2016-06-07 | Disposition: A | Discharge status: Home / Self Care | Payer: Medicare Other | Attending: Family Medicine | Admitting: Family Medicine

## 2016-06-07 ENCOUNTER — Encounter (HOSPITAL_COMMUNITY): Payer: Self-pay | Admitting: Family Medicine

## 2016-06-07 DIAGNOSIS — J069 Acute upper respiratory infection, unspecified: Secondary | ICD-10-CM

## 2016-06-07 DIAGNOSIS — B9789 Other viral agents as the cause of diseases classified elsewhere: Secondary | ICD-10-CM | POA: Diagnosis not present

## 2016-06-07 MED ORDER — BENZONATATE 100 MG PO CAPS: 100.0000 mg | ORAL_CAPSULE | Freq: Three times a day (TID) | ORAL | 0 refills | Status: DC

## 2016-06-07 NOTE — ED Triage Notes (Signed)
Pt here for cough and phlegm in throat x a couple of days.

## 2016-06-07 NOTE — Discharge Instructions (Signed)
You have a viral respiratory infection. Antibiotics will not help the symptoms of this condition. I advise rest, drink plenty of fluids and to take Tessalon 3 times a day for cough.  Should your symptoms fail to improve or worsen follow up with your primary care provider or return to clinic.

## 2016-06-07 NOTE — ED Provider Notes (Signed)
CSN: [id number]     Arrival date & time 06/07/16  1429 History   First MD Initiated Contact with Patient 06/07/16 1637     Chief Complaint  Patient presents with  . Cough   (Consider location/radiation/quality/duration/timing/severity/associated sxs/prior Treatment) 76 year old female presents to clinic with 4 day history of dry, hacking cough, worse at night, not improved with OTC therapy. She denies fever, congestion, body aches or chills. She has no nausea or vomiting. She does not smoke, is not asthmatic and denies history of COPD.   The history is provided by the patient.  Cough  Associated symptoms: no shortness of breath and no wheezing     Past Medical History:  Diagnosis Date  . Allergy   . Anxiety   . Arthritis   . Blood transfusion without reported diagnosis   . Breast cancer ([place]) 07/12/13   invasive mammary carcinoma  . DVT (deep vein thrombosis) in pregnancy ([facility])   . Hypertension   . Hypothyroid   . Pneumonia   . Radiation 11/21/13-01/07/14   Right Breast/supraclav.   Past Surgical History:  Procedure Laterality Date  . biospy     female organs  . BREAST LUMPECTOMY WITH RADIOACTIVE SEED LOCALIZATION Right 09/09/2013   Procedure: BREAST LUMPECTOMY WITH RADIOACTIVE SEED LOCALIZATION WITH AXILLARY NODE EXCISION;  Surgeon: [name], MD;  Location: [facility];  Service: General;  Laterality: Right;  . DILATION AND CURETTAGE OF UTERUS    . RE-EXCISION OF BREAST LUMPECTOMY Right 09/24/2013   Procedure: RE-EXCISION OF RIGHT BREAST LUMPECTOMY;  Surgeon: [name], MD;  Location: [facility];  Service: General;  Laterality: Right;  . removal of teeth  04/19/2012   13 teeth removed   Family History  Problem Relation Age of Onset  . Cancer Brother     prostate  . Heart disease Brother   . Colon cancer Brother    Social History  Substance Use Topics  . Smoking status: Never Smoker  . Smokeless tobacco: Never Used  .  Alcohol use No   OB History    No data available     Review of Systems  Respiratory: Positive for cough. Negative for shortness of breath and wheezing.   Cardiovascular: Negative.   Gastrointestinal: Negative.   Neurological: Negative.   All other systems reviewed and are negative.   Allergies  Anesthetics, amide; Benadryl [diphenhydramine hcl]; Carbocaine [mepivacaine hcl]; Codeine; Epinephrine; Sulfa antibiotics; Vicodin [hydrocodone-acetaminophen]; and Penicillins  Home Medications   Prior to Admission medications   Medication Sig Start Date End Date Taking? Authorizing Provider  acetaminophen (TYLENOL) 500 MG tablet Take 500 mg by mouth every 6 (six) hours as needed for mild pain, fever or headache. Reported on 09/08/2015    Historical Provider, MD  anastrozole (ARIMIDEX) 1 MG tablet Take 1 tablet (1 mg total) by mouth daily. 01/07/16   Chauncey Cruel, MD  benzonatate (TESSALON) 100 MG capsule Take 1 capsule (100 mg total) by mouth every 8 (eight) hours. 06/07/16   Barnet Glasgow, NP  Cholecalciferol (VITAMIN D3) 5000 UNITS TABS Take by mouth.    Historical Provider, MD  gabapentin (NEURONTIN) 100 MG capsule Take 1 capsule (100 mg total) by mouth at bedtime. 04/19/16   [name], MD  HYDROcodone-acetaminophen (NORCO) 5-325 MG tablet Take 1 tablet by mouth daily as needed for moderate pain. 04/15/16   [name], MD  levothyroxine (SYNTHROID) 125 MCG tablet Take 1 tablet (125 mcg total) by mouth daily before breakfast. Dispense  as written: Synthroid LAST REFILL 04/15/16   [name], MD  lisinopril (PRINIVIL,ZESTRIL) 10 MG tablet TAKE 1 TABLET (10 MG TOTAL) BY MOUTH DAILY. 03/17/16   Chauncey Cruel, MD  warfarin (COUMADIN) 5 MG tablet Take 7.5 mg on M,W,F,Sun, and 10 mg on all other days. 12/25/15   Tereasa Coop, PA-C   Meds Ordered and Administered this Visit  Medications - No data to display  BP 135/55   Pulse 110   Temp 98.5 F (36.9 C) (Oral)   Resp  18   SpO2 98%  No data found.   Physical Exam  Constitutional: She is oriented to person, place, and time. She appears well-developed and well-nourished. No distress.  HENT:  Head: Normocephalic.  Right Ear: External ear normal.  Left Ear: External ear normal.  Mouth/Throat: No oropharyngeal exudate.  Neck: Normal range of motion. No JVD present.  Cardiovascular: Normal rate and regular rhythm.   Pulmonary/Chest: Effort normal and breath sounds normal. No respiratory distress. She has no wheezes. She exhibits no tenderness.  Abdominal: Soft. Bowel sounds are normal.  Lymphadenopathy:       Head (right side): Submandibular adenopathy present.       Head (left side): Submandibular adenopathy present.  Neurological: She is alert and oriented to person, place, and time.  Skin: Skin is warm and dry. Capillary refill takes less than 2 seconds. She is not diaphoretic.  Psychiatric: She has a normal mood and affect.  Nursing note and vitals reviewed.   Urgent Care Course   Clinical Course     Procedures (including critical care time)  Labs Review Labs Reviewed - No data to display  Imaging Review No results found.   Visual Acuity Review  Right Eye Distance:   Left Eye Distance:   Bilateral Distance:    Right Eye Near:   Left Eye Near:    Bilateral Near:         MDM   1. Viral URI with cough   You have a viral respiratory infection. Antibiotics will not help the symptoms of this condition. I advise rest, drink plenty of fluids and to take Tessalon 3 times a day for cough.  Should your symptoms fail to improve or worsen follow up with your primary care provider or return to clinic.     Barnet Glasgow, NP 06/07/16 1651

## 2016-06-14 ENCOUNTER — Ambulatory Visit (HOSPITAL_COMMUNITY)
Admission: EM | Admit: 2016-06-14 | Discharge: 2016-06-14 | Disposition: A | Discharge status: Home / Self Care | Payer: Medicare Other | Attending: Family Medicine | Admitting: Family Medicine

## 2016-06-14 ENCOUNTER — Ambulatory Visit (INDEPENDENT_AMBULATORY_CARE_PROVIDER_SITE_OTHER): Payer: Medicare Other

## 2016-06-14 ENCOUNTER — Encounter (HOSPITAL_COMMUNITY): Payer: Self-pay | Admitting: Emergency Medicine

## 2016-06-14 DIAGNOSIS — J069 Acute upper respiratory infection, unspecified: Secondary | ICD-10-CM

## 2016-06-14 DIAGNOSIS — B9789 Other viral agents as the cause of diseases classified elsewhere: Secondary | ICD-10-CM

## 2016-06-14 DIAGNOSIS — R05 Cough: Secondary | ICD-10-CM | POA: Diagnosis not present

## 2016-06-14 MED ORDER — IPRATROPIUM BROMIDE 0.06 % NA SOLN: 2.0000 | Freq: Four times a day (QID) | NASAL | 12 refills | Status: DC

## 2016-06-14 MED ORDER — AZITHROMYCIN 250 MG PO TABS: ORAL_TABLET | ORAL | 0 refills | Status: DC

## 2016-06-14 NOTE — ED Triage Notes (Signed)
Pt here for a follow up for a URI from 06/07/16.  Pt states she is getting worse with a productive cough and bilateral ear problems.  She also reports some issues with right eye redness.  Pt reports a fever of 100.8 for the last few days relieved by Tylenol.

## 2016-06-14 NOTE — ED Provider Notes (Signed)
[place]    CSN: [id number] Arrival date & time: 06/14/16  1214     History   Chief Complaint Chief Complaint  Patient presents with  . URI    HPI Alicia Vasquez is a 76 y.o. female.   The history is provided by the patient.  URI  Presenting symptoms: congestion, cough and rhinorrhea   Presenting symptoms: no fever   Severity:  Mild Onset quality:  Gradual Duration:  1 week Chronicity:  Recurrent Relieved by:  None tried Worsened by:  Nothing Ineffective treatments:  None tried Associated symptoms: sinus pain   Associated symptoms: no wheezing     Past Medical History:  Diagnosis Date  . Allergy   . Anxiety   . Arthritis   . Blood transfusion without reported diagnosis   . Breast cancer ([place]) 07/12/13   invasive mammary carcinoma  . DVT (deep vein thrombosis) in pregnancy ([facility])   . Hypertension   . Hypothyroid   . Pneumonia   . Radiation 11/21/13-01/07/14   Right Breast/supraclav.    Patient Active Problem List   Diagnosis Date Noted  . Lymphedema 09/17/2015  . Long term current use of anticoagulant therapy 08/23/2015  . DVT, lower extremity ([place]) 06/18/2015  . Bilateral knee pain 04/03/2015  . Arm edema 08/28/2014  . Vitamin D deficiency 04/23/2014  . Hyperparathyroidism ([place]) 04/23/2014  . Depression 07/18/2013  . Malignant neoplasm of upper-outer quadrant of right breast in female, estrogen receptor positive ([place]) 07/15/2013  . Encounter for therapeutic drug monitoring 07/05/2013  . Overactive bladder 01/30/2013  . Primary hypercoagulable state ([place]) 12/19/2012  . HTN (hypertension) 09/27/2011  . Hearing loss 09/27/2011  . Chest wall pain 09/27/2011  . Seasonal allergies 09/27/2011  . SOB (shortness of breath), related to deconditioning 08/26/2011  . Hypothyroid 08/26/2011    Past Surgical History:  Procedure Laterality Date  . biospy     female organs  . BREAST LUMPECTOMY WITH RADIOACTIVE SEED LOCALIZATION Right 09/09/2013   Procedure: BREAST LUMPECTOMY WITH RADIOACTIVE SEED LOCALIZATION WITH AXILLARY NODE EXCISION;  Surgeon: [name], MD;  Location: [facility];  Service: General;  Laterality: Right;  . DILATION AND CURETTAGE OF UTERUS    . RE-EXCISION OF BREAST LUMPECTOMY Right 09/24/2013   Procedure: RE-EXCISION OF RIGHT BREAST LUMPECTOMY;  Surgeon: [name], MD;  Location: [facility];  Service: General;  Laterality: Right;  . removal of teeth  04/19/2012   13 teeth removed    OB History    No data available       Home Medications    Prior to Admission medications   Medication Sig Start Date End Date Taking? Authorizing Provider  acetaminophen (TYLENOL) 500 MG tablet Take 500 mg by mouth every 6 (six) hours as needed for mild pain, fever or headache. Reported on 09/08/2015   Yes Historical Provider, MD  anastrozole (ARIMIDEX) 1 MG tablet Take 1 tablet (1 mg total) by mouth daily. 01/07/16  Yes Chauncey Cruel, MD  benzonatate (TESSALON) 100 MG capsule Take 1 capsule (100 mg total) by mouth every 8 (eight) hours. 06/07/16  Yes Barnet Glasgow, NP  Cholecalciferol (VITAMIN D3) 5000 UNITS TABS Take by mouth.   Yes Historical Provider, MD  gabapentin (NEURONTIN) 100 MG capsule Take 1 capsule (100 mg total) by mouth at bedtime. 04/19/16  Yes [name], MD  levothyroxine (SYNTHROID) 125 MCG tablet Take 1 tablet (125 mcg total) by mouth daily before breakfast. Dispense as written: Synthroid LAST REFILL  04/15/16  Yes [name], MD  lisinopril (PRINIVIL,ZESTRIL) 10 MG tablet TAKE 1 TABLET (10 MG TOTAL) BY MOUTH DAILY. 03/17/16  Yes Chauncey Cruel, MD  warfarin (COUMADIN) 5 MG tablet Take 7.5 mg on M,W,F,Sun, and 10 mg on all other days. 12/25/15  Yes Tereasa Coop, PA-C  HYDROcodone-acetaminophen (NORCO) 5-325 MG tablet Take 1 tablet by mouth daily as needed for moderate pain. 04/15/16   [name], MD    Family History Family History  Problem  Relation Age of Onset  . Cancer Brother     prostate  . Heart disease Brother   . Colon cancer Brother     Social History Social History  Substance Use Topics  . Smoking status: Never Smoker  . Smokeless tobacco: Never Used  . Alcohol use No     Allergies   Anesthetics, amide; Benadryl [diphenhydramine hcl]; Carbocaine [mepivacaine hcl]; Codeine; Epinephrine; Sulfa antibiotics; Vicodin [hydrocodone-acetaminophen]; and Penicillins   Review of Systems Review of Systems  Constitutional: Negative for fever.  HENT: Positive for congestion, postnasal drip, rhinorrhea, sinus pain and sinus pressure.   Respiratory: Positive for cough. Negative for wheezing.   Cardiovascular: Negative.   Gastrointestinal: Negative.      Physical Exam Triage Vital Signs ED Triage Vitals [06/14/16 1258]  Enc Vitals Group     BP 120/78     Pulse Rate 95     Resp      Temp 98.1 F (36.7 C)     Temp src      SpO2 95 %     Weight      Height      Head Circumference      Peak Flow      Pain Score 0     Pain Loc      Pain Edu?      Excl. in [place]?    No data found.   Updated Vital Signs BP 120/78 (BP Location: Left Arm)   Pulse 95   Temp 98.1 F (36.7 C)   SpO2 95%   Visual Acuity Right Eye Distance:   Left Eye Distance:   Bilateral Distance:    Right Eye Near:   Left Eye Near:    Bilateral Near:     Physical Exam  Constitutional: She is oriented to person, place, and time. She appears well-developed and well-nourished. No distress.  HENT:  Right Ear: External ear normal.  Left Ear: External ear normal.  Nose: Nose normal.  Mouth/Throat: Oropharynx is clear and moist.  Eyes: Pupils are equal, round, and reactive to light. Right eye exhibits discharge.  Neck: Normal range of motion.  Cardiovascular: Normal rate.   Lymphadenopathy:    She has cervical adenopathy.  Neurological: She is alert and oriented to person, place, and time.  Skin: Skin is warm and dry.  Nursing note  and vitals reviewed.    UC Treatments / Results  Labs (all labs ordered are listed, but only abnormal results are displayed) Labs Reviewed - No data to display  EKG  EKG Interpretation None       Radiology No results found.  Procedures Procedures (including critical care time)  Medications Ordered in UC Medications - No data to display   Initial Impression / Assessment and Plan / UC Course  I have reviewed the triage vital signs and the nursing notes.  Pertinent labs & imaging results that were available during my care of the patient were reviewed by me and considered in my medical  decision making (see chart for details).  Clinical Course       Final Clinical Impressions(s) / UC Diagnoses   Final diagnoses:  None    New Prescriptions New Prescriptions   No medications on file     Billy Fischer, MD 06/28/16 1333

## 2016-06-14 NOTE — Discharge Instructions (Signed)
Drink plenty of fluids as discussed, use medicine as prescribed, and mucinex or delsym for cough. Return or see your doctor if further problems °

## 2016-06-23 ENCOUNTER — Ambulatory Visit (HOSPITAL_BASED_OUTPATIENT_CLINIC_OR_DEPARTMENT_OTHER): Payer: Medicare Other | Admitting: Oncology

## 2016-06-23 ENCOUNTER — Other Ambulatory Visit (HOSPITAL_BASED_OUTPATIENT_CLINIC_OR_DEPARTMENT_OTHER): Payer: Medicare Other

## 2016-06-23 VITALS — BP 144/59 | HR 74 | Temp 97.4°F | Resp 16 | Ht 64.0 in | Wt 210.2 lb

## 2016-06-23 DIAGNOSIS — Z170 Estrogen receptor positive status [ER+]: Secondary | ICD-10-CM | POA: Diagnosis not present

## 2016-06-23 DIAGNOSIS — C50411 Malignant neoplasm of upper-outer quadrant of right female breast: Secondary | ICD-10-CM

## 2016-06-23 DIAGNOSIS — I82403 Acute embolism and thrombosis of unspecified deep veins of lower extremity, bilateral: Secondary | ICD-10-CM

## 2016-06-23 DIAGNOSIS — M8580 Other specified disorders of bone density and structure, unspecified site: Secondary | ICD-10-CM

## 2016-06-23 DIAGNOSIS — Z79811 Long term (current) use of aromatase inhibitors: Secondary | ICD-10-CM | POA: Diagnosis not present

## 2016-06-23 DIAGNOSIS — D6859 Other primary thrombophilia: Secondary | ICD-10-CM

## 2016-06-23 LAB — PROTIME-INR
INR: 2.9 (ref 2.00–3.50)
PROTIME: 34.8 s — AB (ref 10.6–13.4)

## 2016-06-23 NOTE — Progress Notes (Signed)
[facility]  [phone] [phone]     ID: [facility] OB: June 12, 1940  MR#: [id number]  KXF#:[id number]  PCP: [name], MD GYN:   SU: [name] OTHER MD: Thea Silversmith, Hart Robinsons, Philemon Kingdom  CHIEF COMPLAINT: Estrogen receptor positive breast cancer  CURRENT TREATMENT:  anastrozole  BREAST CANCER HISTORY: From doctor [name]'s intake node 07/24/2013:  "76 y.o. female. Who presented with SOB and had a CT chest performed that revealed a right breast mass. Mammogram/ultrsound on 2/13 showed a mass in the 11 o'clock position in the right breast measuring 1.5 cm. Also noted was a right axillary LN measuring 1.6 cm. MRI not performed. Biopsy of mass and lymph done. 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 Lymph node + for metastatic carcinoma."  [On 09/09/2013 the patient underwent right lumpectomy and sentinel lymph node sampling. This showed (SZA [phone]) multifocal invasive ductal carcinoma, grade 1, the largest lesion measuring 1.8 cm, the second lesion 1.2 cm. One of 4 sentinel lymph nodes was positive, with extracapsular extension. Margins were positive. HER-2 was repeated and was again negative. Further surgery 09/16/2013 obtained clear margins.  Her subsequent history is as detailed below  INTERVAL HISTORY: Jaquita returns today for follow-up of her breast cancer. We are also following her INR regularly. Since her last visit here she started anastrozole. She generally tolerates it well. Hot flashes are not a major issue. She is having a variety of pains, involving her left foot for example, as well as both knees, but it is not clear that this is related to the medication. She obtains a drug at a good price.  Her INR values continue to fluctuate but at least today they are simply perfect, with no change in  diet, activity, or dose.   REVIEW OF SYSTEMS: Alejandro tells me she had a horrible bronchitis episode of which was eventually treated with a Z-Pak and that it is better but she still has a little bit of a cough. She also had pinkeye on the right. This was treated with eyedrops by her ophthalmologist. Sometimes she finds it difficult to swallow food. She tries to chew carefully. Sometimes she has pain in the surgical breast. Aside from these issues a detailed review of systems today was noncontributory  PAST MEDICAL HISTORY: Past Medical History:  Diagnosis Date  . Allergy   . Anxiety   . Arthritis   . Blood transfusion without reported diagnosis   . Breast cancer ([place]) 07/12/13   invasive mammary carcinoma  . DVT (deep vein thrombosis) in pregnancy ([place])   . Hypertension   . Hypothyroid   . Pneumonia   . Radiation 11/21/13-01/07/14   Right Breast/supraclav.    PAST SURGICAL HISTORY: Past Surgical History:  Procedure Laterality Date  . biospy     female organs  . BREAST LUMPECTOMY WITH RADIOACTIVE SEED LOCALIZATION Right 09/09/2013   Procedure: BREAST LUMPECTOMY WITH RADIOACTIVE SEED LOCALIZATION WITH AXILLARY NODE EXCISION;  Surgeon: [name], MD;  Location: [facility];  Service: General;  Laterality: Right;  . DILATION AND CURETTAGE OF UTERUS    . RE-EXCISION OF BREAST LUMPECTOMY Right 09/24/2013   Procedure: RE-EXCISION OF RIGHT BREAST LUMPECTOMY;  Surgeon: [name], MD;  Location: [facility];  Service: General;  Laterality: Right;  . removal of teeth  04/19/2012   13 teeth removed  FAMILY HISTORY Family History  Problem Relation Age of Onset  . Cancer Brother     prostate  . Heart disease Brother   . Colon cancer Brother    the patient's father died at the age of 63 after an automobile accident. The patient's mother died at the age of 68. She was a Marine scientist here in [name] in the old [facility]. She was infected with polio  and was confined to a wheelchair for a good part of her life. She eventually died of pneumonia. The patient had one brother, who died with prostate cancer. She had no sisters. There is no history of breast or ovarian cancer in the family.  GYNECOLOGIC HISTORY:  Menarche age 55, first live birth age 61, the patient is GX P1. She went through the change of life at age 37. 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 She is widowed. Currently she is staying with her friend [name],  Fontaine No is a retired Radio producer. The patient's son frank lives in [place]. He works an Engineer, technical sales. The patient has no grandchildren. She is a Tourist information centre manager but currently attends a [employer] with her friend [name]    ADVANCED DIRECTIVES: Not in place   HEALTH MAINTENANCE: Social History  Substance Use Topics  . Smoking status: Never Smoker  . Smokeless tobacco: Never Used  . Alcohol use No     Colonoscopy: Never  PAP:  Bone density: 10/01/2009; lowest T score -0.8  Lipid panel:  Allergies  Allergen Reactions  . Anesthetics, Amide Other (See Comments)    ELEVATE BLOOD PRESSURE  . Benadryl [Diphenhydramine Hcl]     dizziness  . Carbocaine [Mepivacaine Hcl] Other (See Comments)    Elevated blood pressure  . Codeine Nausea Only and Other (See Comments)    dizziness  . Epinephrine Other (See Comments)    Elevated blood pressure  . Sulfa Antibiotics Other (See Comments)    dizziness  . Vicodin [Hydrocodone-Acetaminophen] Nausea Only  . Penicillins Rash    Current Outpatient Prescriptions  Medication Sig Dispense Refill  . acetaminophen (TYLENOL) 500 MG tablet Take 500 mg by mouth every 6 (six) hours as needed for mild pain, fever or headache. Reported on 09/08/2015    . anastrozole (ARIMIDEX) 1 MG tablet Take 1 tablet (1 mg total) by mouth daily. 90 tablet 4  . azithromycin (ZITHROMAX Z-PAK) 250 MG tablet Take as directed on  pack 6 tablet 0  . benzonatate (TESSALON) 100 MG capsule Take 1 capsule (100 mg total) by mouth every 8 (eight) hours. 21 capsule 0  . Cholecalciferol (VITAMIN D3) 5000 UNITS TABS Take by mouth.    . gabapentin (NEURONTIN) 100 MG capsule Take 1 capsule (100 mg total) by mouth at bedtime. 30 capsule 3  . HYDROcodone-acetaminophen (NORCO) 5-325 MG tablet Take 1 tablet by mouth daily as needed for moderate pain. 30 tablet 0  . ipratropium (ATROVENT) 0.06 % nasal spray Place 2 sprays into both nostrils 4 (four) times daily. 15 mL 12  . levothyroxine (SYNTHROID) 125 MCG tablet Take 1 tablet (125 mcg total) by mouth daily before breakfast. Dispense as written: Synthroid LAST REFILL 90 tablet 3  . lisinopril (PRINIVIL,ZESTRIL) 10 MG tablet TAKE 1 TABLET (10 MG TOTAL) BY MOUTH DAILY. 90 tablet 1  . warfarin (COUMADIN) 5 MG tablet Take 7.5 mg on M,W,F,Sun, and 10 mg on all other days. 60 tablet 0   No current  facility-administered medications for this visit.     OBJECTIVE: Older white woman Who appears stated age   50:   06/23/16 1448  BP: (!) 144/59  Pulse: 74  Resp: 16  Temp: 97.4 F (36.3 C)     Body mass index is 36.08 kg/m.    ECOG FS:2 - Symptomatic, <50% confined to bed  Sclerae unicteric, EOMs intact Oropharynx clear and moist No cervical or supraclavicular adenopathy Lungs no rales or rhonchi Heart regular rate and rhythm Abd soft, nontender, positive bowel sounds MSK no focal spinal tenderness, no upper extremity lymphedema Neuro: nonfocal, well oriented, appropriate affect Breasts: The right breast is status post lumpectomy followed by radiation with no evidence of local recurrence. The right axilla is benign. The left breast is unremarkable    LAB RESULTS:  CMP     Component Value Date/Time   NA 141 03/17/2016 1450   K 4.1 03/17/2016 1450   CL 105 08/19/2015 1602   CO2 25 03/17/2016 1450   GLUCOSE 121 03/17/2016 1450   BUN 19.3 03/17/2016 1450   CREATININE 1.3  (H) 03/17/2016 1450   CALCIUM 10.8 (H) 03/17/2016 1450   PROT 7.1 03/17/2016 1450   ALBUMIN 3.9 03/17/2016 1450   AST 18 03/17/2016 1450   ALT 20 03/17/2016 1450   ALKPHOS 89 03/17/2016 1450   BILITOT 0.32 03/17/2016 1450   GFRNONAA 75 08/19/2015 1602   GFRAA 87 08/19/2015 1602    I No results found for: SPEP  Lab Results  Component Value Date   WBC 6.1 03/17/2016   NEUTROABS 3.7 03/17/2016   HGB 14.5 03/17/2016   HCT 44.2 03/17/2016   MCV 92.1 03/17/2016   PLT 203 03/17/2016      Chemistry      Component Value Date/Time   NA 141 03/17/2016 1450   K 4.1 03/17/2016 1450   CL 105 08/19/2015 1602   CO2 25 03/17/2016 1450   BUN 19.3 03/17/2016 1450   CREATININE 1.3 (H) 03/17/2016 1450      Component Value Date/Time   CALCIUM 10.8 (H) 03/17/2016 1450   ALKPHOS 89 03/17/2016 1450   AST 18 03/17/2016 1450   ALT 20 03/17/2016 1450   BILITOT 0.32 03/17/2016 1450       No results found for: LABCA2  No components found for: LABCA125   Recent Labs Lab 06/23/16 1425  INR 2.90    Urinalysis    Component Value Date/Time   LABSPEC 1.015 04/19/2016 1705   PHURINE 7.5 04/19/2016 [place] 04/19/2016 1705   HGBUR NEGATIVE 04/19/2016 [place] 04/19/2016 1705   BILIRUBINUR negative 02/27/2015 1714   BILIRUBINUR neg 02/13/2015 [place] 04/19/2016 1705   PROTEINUR NEGATIVE 04/19/2016 1705   UROBILINOGEN 0.2 04/19/2016 1705   NITRITE NEGATIVE 04/19/2016 1705   LEUKOCYTESUR NEGATIVE 04/19/2016 1705   Results for ANNETTE, BERTELSON (MRN [id number]) as of 06/23/2016 17:38  Ref. Range 03/24/2016 15:15 04/07/2016 14:40 04/20/2016 14:33 04/27/2016 14:34 05/05/2016 14:46 05/19/2016 14:27 05/26/2016 14:09 06/23/2016 14:25  INR Latest Ref Range: 2.00 - 3.50  1.90 (L) 2.60 1.30 (L) 3.70 (H) 2.60 3.80 (H) 1.80 (L) 2.90   STUDIES: Dg Chest 2 View  Result Date: 06/14/2016 CLINICAL DATA:  Productive cough and dyspnea for 2 weeks.  Breast cancer . EXAM: CHEST  2 VIEW COMPARISON:  09/24/2015 chest radiograph. FINDINGS: Right axillary and right breast surgical clips are noted. Stable cardiomediastinal silhouette with normal heart size. No pneumothorax. No  pleural effusion. Stable biapical pleural-parenchymal scarring. No pulmonary edema. No acute consolidative airspace disease. IMPRESSION: No active cardiopulmonary disease. Electronically Signed   By: Ilona Sorrel M.D.   On: 06/14/2016 13:32    ASSESSMENT: 76 y.o. [place] woman status post right breast upper outer quadrant lumpectomy and sentinel lymph node sampling 09/09/2013 for an mpT1c pN1a, stage IIA invasive ductal carcinoma, estrogen and progesterone receptor both 100% positive with strong staining intensity, MIB-1 of 17% and no HER-2 amplification  (1) additional surgery for margin clearance 09/16/2013 obtained negative margins  (2) Oncotype DX recurrence score of 4 predicts a risk of outside the breast recurrence within 10 years of 7% if the patient's only systemic therapy is tamoxifen for 5 years. It also predicts no benefit from chemotherapy  (3) adjuvant radiation completed 01/07/2014  (4) anastrozole started 02/27/2014 stopped within 2 weeks because of arm swelling.   (a) bone density April 2016 showed osteopenia, with a t-score of -1.6  (b) anastrozole resumed 12/17/2015  (5) history of left lower extremity DVT 11/23/2012, initially on [place] ovarian, which causes chest pain, switch to Coumadin July 2014  PLAN: Berdie is having some symptoms which may be related to the anastrozole, but I think more likely they are due to arthritis. My recommendation is that she pushed through and try to stay on this medication as there is no more effective treatment for her breast cancer and in addition this is one of the least expensive.  She will need to have a repeat bone density this year and I have entered that order so it will be done at the same time as her  mammogram in April. She will see me shortly after that and at that point we will decide whether or not she wishes to stay on anastrozole.  In the meantime we are continuing to check her INR on a monthly basis as before.  She knows to call for any problems that may develop before her next visit here   Chauncey Cruel, MD   06/23/2016 3:15 PM

## 2016-06-25 ENCOUNTER — Other Ambulatory Visit: Payer: Self-pay | Admitting: Oncology

## 2016-06-25 DIAGNOSIS — I82409 Acute embolism and thrombosis of unspecified deep veins of unspecified lower extremity: Secondary | ICD-10-CM

## 2016-06-25 DIAGNOSIS — Z86711 Personal history of pulmonary embolism: Secondary | ICD-10-CM

## 2016-07-21 ENCOUNTER — Other Ambulatory Visit (HOSPITAL_BASED_OUTPATIENT_CLINIC_OR_DEPARTMENT_OTHER): Payer: Medicare Other

## 2016-07-21 DIAGNOSIS — I82403 Acute embolism and thrombosis of unspecified deep veins of lower extremity, bilateral: Secondary | ICD-10-CM | POA: Diagnosis not present

## 2016-07-21 DIAGNOSIS — M8580 Other specified disorders of bone density and structure, unspecified site: Secondary | ICD-10-CM

## 2016-07-21 DIAGNOSIS — C50411 Malignant neoplasm of upper-outer quadrant of right female breast: Secondary | ICD-10-CM

## 2016-07-21 DIAGNOSIS — Z170 Estrogen receptor positive status [ER+]: Principal | ICD-10-CM

## 2016-07-21 LAB — PROTIME-INR
INR: 3.5 (ref 2.00–3.50)
PROTIME: 42 s — AB (ref 10.6–13.4)

## 2016-07-21 LAB — CBC WITH DIFFERENTIAL/PLATELET
BASO%: 1 % (ref 0.0–2.0)
Basophils Absolute: 0.1 10*3/uL (ref 0.0–0.1)
EOS ABS: 0.2 10*3/uL (ref 0.0–0.5)
EOS%: 2.6 % (ref 0.0–7.0)
HCT: 41.9 % (ref 34.8–46.6)
HGB: 13.9 g/dL (ref 11.6–15.9)
LYMPH%: 26.6 % (ref 14.0–49.7)
MCH: 30.3 pg (ref 25.1–34.0)
MCHC: 33.2 g/dL (ref 31.5–36.0)
MCV: 91.5 fL (ref 79.5–101.0)
MONO#: 0.9 10*3/uL (ref 0.1–0.9)
MONO%: 14.9 % — AB (ref 0.0–14.0)
NEUT%: 54.9 % (ref 38.4–76.8)
NEUTROS ABS: 3.3 10*3/uL (ref 1.5–6.5)
NRBC: 0 % (ref 0–0)
Platelets: 204 10*3/uL (ref 145–400)
RBC: 4.58 10*6/uL (ref 3.70–5.45)
RDW: 14.3 % (ref 11.2–14.5)
WBC: 6.1 10*3/uL (ref 3.9–10.3)
lymph#: 1.6 10*3/uL (ref 0.9–3.3)

## 2016-07-22 ENCOUNTER — Telehealth: Payer: Self-pay | Admitting: *Deleted

## 2016-07-22 NOTE — Telephone Encounter (Signed)
This RN spoke with pt on 2/22 regarding INR.  Pt not at home per call and did not know exact dose of current coumadin

## 2016-08-04 ENCOUNTER — Other Ambulatory Visit (HOSPITAL_BASED_OUTPATIENT_CLINIC_OR_DEPARTMENT_OTHER): Payer: Medicare Other

## 2016-08-04 DIAGNOSIS — I82402 Acute embolism and thrombosis of unspecified deep veins of left lower extremity: Secondary | ICD-10-CM

## 2016-08-04 DIAGNOSIS — C50411 Malignant neoplasm of upper-outer quadrant of right female breast: Secondary | ICD-10-CM

## 2016-08-04 DIAGNOSIS — I82403 Acute embolism and thrombosis of unspecified deep veins of lower extremity, bilateral: Secondary | ICD-10-CM

## 2016-08-04 LAB — PROTIME-INR
INR: 2.9 (ref 2.00–3.50)
PROTIME: 34.8 s — AB (ref 10.6–13.4)

## 2016-08-15 ENCOUNTER — Other Ambulatory Visit: Payer: Self-pay

## 2016-08-15 ENCOUNTER — Other Ambulatory Visit: Payer: Self-pay | Admitting: Oncology

## 2016-08-15 DIAGNOSIS — Z170 Estrogen receptor positive status [ER+]: Principal | ICD-10-CM

## 2016-08-15 DIAGNOSIS — C50411 Malignant neoplasm of upper-outer quadrant of right female breast: Secondary | ICD-10-CM

## 2016-08-18 ENCOUNTER — Other Ambulatory Visit (HOSPITAL_BASED_OUTPATIENT_CLINIC_OR_DEPARTMENT_OTHER): Payer: Medicare Other

## 2016-08-18 DIAGNOSIS — C50411 Malignant neoplasm of upper-outer quadrant of right female breast: Secondary | ICD-10-CM

## 2016-08-18 DIAGNOSIS — I82403 Acute embolism and thrombosis of unspecified deep veins of lower extremity, bilateral: Secondary | ICD-10-CM | POA: Diagnosis present

## 2016-08-18 DIAGNOSIS — Z170 Estrogen receptor positive status [ER+]: Principal | ICD-10-CM

## 2016-08-18 DIAGNOSIS — M8580 Other specified disorders of bone density and structure, unspecified site: Secondary | ICD-10-CM

## 2016-08-18 LAB — CBC WITH DIFFERENTIAL/PLATELET
BASO%: 0.9 % (ref 0.0–2.0)
BASOS ABS: 0.1 10*3/uL (ref 0.0–0.1)
EOS ABS: 0.2 10*3/uL (ref 0.0–0.5)
EOS%: 2.5 % (ref 0.0–7.0)
HEMATOCRIT: 42.6 % (ref 34.8–46.6)
HGB: 14.2 g/dL (ref 11.6–15.9)
LYMPH%: 24 % (ref 14.0–49.7)
MCH: 30.6 pg (ref 25.1–34.0)
MCHC: 33.3 g/dL (ref 31.5–36.0)
MCV: 91.8 fL (ref 79.5–101.0)
MONO#: 0.9 10*3/uL (ref 0.1–0.9)
MONO%: 13.9 % (ref 0.0–14.0)
NEUT#: 4 10*3/uL (ref 1.5–6.5)
NEUT%: 58.7 % (ref 38.4–76.8)
PLATELETS: 205 10*3/uL (ref 145–400)
RBC: 4.64 10*6/uL (ref 3.70–5.45)
RDW: 14 % (ref 11.2–14.5)
WBC: 6.8 10*3/uL (ref 3.9–10.3)
lymph#: 1.6 10*3/uL (ref 0.9–3.3)
nRBC: 0 % (ref 0–0)

## 2016-08-18 LAB — PROTIME-INR
INR: 2.8 (ref 2.00–3.50)
Protime: 33.6 Seconds — ABNORMAL HIGH (ref 10.6–13.4)

## 2016-08-19 ENCOUNTER — Telehealth: Payer: Self-pay | Admitting: *Deleted

## 2016-08-19 NOTE — Telephone Encounter (Signed)
INR 2.8 Hypercoag Pt does not remember coumadin dose Next lab 09/22/2016.  No change in coumadin dose. This RN left message on VM for pt per above.

## 2016-08-25 ENCOUNTER — Ambulatory Visit (HOSPITAL_COMMUNITY)
Admission: EM | Admit: 2016-08-25 | Discharge: 2016-08-25 | Disposition: A | Discharge status: Home / Self Care | Payer: Medicare Other | Attending: Family Medicine | Admitting: Family Medicine

## 2016-08-25 ENCOUNTER — Encounter (HOSPITAL_COMMUNITY): Payer: Self-pay | Admitting: Emergency Medicine

## 2016-08-25 DIAGNOSIS — M5432 Sciatica, left side: Secondary | ICD-10-CM | POA: Diagnosis not present

## 2016-08-25 DIAGNOSIS — M79605 Pain in left leg: Secondary | ICD-10-CM

## 2016-08-25 MED ORDER — HYDROCODONE-ACETAMINOPHEN 5-325 MG PO TABS: 1.0000 | ORAL_TABLET | Freq: Every day | ORAL | 0 refills | Status: DC | PRN

## 2016-08-25 MED ORDER — PREDNISONE 10 MG PO TABS: ORAL_TABLET | ORAL | 0 refills | Status: DC

## 2016-08-25 NOTE — ED Triage Notes (Signed)
Numbness in left leg since November.  Patient ripped 2 weeks ago-no fall, but leg numbness has worsened.  Reports pain in groin area

## 2016-08-25 NOTE — ED Provider Notes (Signed)
CSN: [id number]     Arrival date & time 08/25/16  1539 History   First MD Initiated Contact with Patient 08/25/16 1603     Chief Complaint  Patient presents with  . Leg Pain   (Consider location/radiation/quality/duration/timing/severity/associated sxs/prior Treatment) Patient c/o left leg numbness and discomfort that has worsened after she slipped and almost fell.  She states the pain now radiates down her left groin and she has pain in her left leg.  She has hx of DDD of LS spine.  She has hx of sciatica.   The history is provided by the patient.  Leg Pain  Location:  Hip and foot Time since incident:  2 days Injury: yes   Hip location:  L hip Foot location:  L foot Pain details:    Quality:  Aching   Severity:  Moderate   Onset quality:  Sudden   Duration:  2 days   Timing:  Constant Chronicity:  New Dislocation: no   Foreign body present:  Unable to specify Tetanus status:  Unknown Prior injury to area:  No Relieved by:  Nothing Worsened by:  Nothing Ineffective treatments:  None tried Associated symptoms: back pain     Past Medical History:  Diagnosis Date  . Allergy   . Anxiety   . Arthritis   . Blood transfusion without reported diagnosis   . Breast cancer ([place]) 07/12/13   invasive mammary carcinoma  . DVT (deep vein thrombosis) in pregnancy ([place])   . Hypertension   . Hypothyroid   . Pneumonia   . Radiation 11/21/13-01/07/14   Right Breast/supraclav.   Past Surgical History:  Procedure Laterality Date  . biospy     female organs  . BREAST LUMPECTOMY WITH RADIOACTIVE SEED LOCALIZATION Right 09/09/2013   Procedure: BREAST LUMPECTOMY WITH RADIOACTIVE SEED LOCALIZATION WITH AXILLARY NODE EXCISION;  Surgeon: [name], MD;  Location: [facility];  Service: General;  Laterality: Right;  . DILATION AND CURETTAGE OF UTERUS    . RE-EXCISION OF BREAST LUMPECTOMY Right 09/24/2013   Procedure: RE-EXCISION OF RIGHT BREAST LUMPECTOMY;  Surgeon:  [name], MD;  Location: [facility];  Service: General;  Laterality: Right;  . removal of teeth  04/19/2012   13 teeth removed   Family History  Problem Relation Age of Onset  . Cancer Brother     prostate  . Heart disease Brother   . Colon cancer Brother    Social History  Substance Use Topics  . Smoking status: Never Smoker  . Smokeless tobacco: Never Used  . Alcohol use No   OB History    No data available     Review of Systems  Constitutional: Negative.   HENT: Negative.   Eyes: Negative.   Respiratory: Negative.   Cardiovascular: Negative.   Gastrointestinal: Negative.   Endocrine: Negative.   Genitourinary: Negative.   Musculoskeletal: Positive for back pain.  Allergic/Immunologic: Negative.   Neurological: Negative.   Hematological: Negative.   Psychiatric/Behavioral: Negative.     Allergies  Anesthetics, amide; Benadryl [diphenhydramine hcl]; Carbocaine [mepivacaine hcl]; Codeine; Epinephrine; Sulfa antibiotics; Vicodin [hydrocodone-acetaminophen]; and Penicillins  Home Medications   Prior to Admission medications   Medication Sig Start Date End Date Taking? Authorizing Provider  acetaminophen (TYLENOL) 500 MG tablet Take 500 mg by mouth every 6 (six) hours as needed for mild pain, fever or headache. Reported on 09/08/2015    Historical Provider, MD  anastrozole (ARIMIDEX) 1 MG tablet Take 1 tablet (1 mg total) by  mouth daily. 01/07/16   Chauncey Cruel, MD  azithromycin (ZITHROMAX Z-PAK) 250 MG tablet Take as directed on pack 06/14/16   Billy Fischer, MD  benzonatate (TESSALON) 100 MG capsule Take 1 capsule (100 mg total) by mouth every 8 (eight) hours. 06/07/16   Barnet Glasgow, NP  Cholecalciferol (VITAMIN D3) 5000 UNITS TABS Take by mouth.    Historical Provider, MD  gabapentin (NEURONTIN) 100 MG capsule Take 1 capsule (100 mg total) by mouth at bedtime. 04/19/16   [name], MD  HYDROcodone-acetaminophen (NORCO) 5-325 MG  tablet Take 1-2 tablets by mouth daily as needed for moderate pain. 08/25/16   Lysbeth Penner, FNP  ipratropium (ATROVENT) 0.06 % nasal spray Place 2 sprays into both nostrils 4 (four) times daily. 06/14/16   Billy Fischer, MD  JANTOVEN 5 MG tablet TAKE AS DIRECTED BY ANTICOAGULATION CLINIC 06/27/16   Chauncey Cruel, MD  levothyroxine (SYNTHROID) 125 MCG tablet Take 1 tablet (125 mcg total) by mouth daily before breakfast. Dispense as written: Synthroid LAST REFILL 04/15/16   [name], MD  lisinopril (PRINIVIL,ZESTRIL) 10 MG tablet TAKE 1 TABLET (10 MG TOTAL) BY MOUTH DAILY. 03/17/16   Chauncey Cruel, MD  predniSONE (DELTASONE) 10 MG tablet Take 4 po qd x 2 days then 3 po qd x 2 days then 2 po qd x 2 days then 1po qd x  2 days then stop 08/25/16   Lysbeth Penner, FNP  warfarin (COUMADIN) 5 MG tablet Take 7.5 mg on M,W,F,Sun, and 10 mg on all other days. 12/25/15   Tereasa Coop, PA-C   Meds Ordered and Administered this Visit  Medications - No data to display  BP (!) 143/78 (BP Location: Left Arm)   Pulse 72   Temp 97.9 F (36.6 C) (Oral)   Resp (!) 22   SpO2 97%  No data found.   Physical Exam  Constitutional: She is oriented to person, place, and time. She appears well-developed and well-nourished.  HENT:  Head: Normocephalic and atraumatic.  Eyes: Conjunctivae and EOM are normal. Pupils are equal, round, and reactive to light.  Neck: Normal range of motion.  Cardiovascular: Normal rate, regular rhythm and normal heart sounds.   Pulmonary/Chest: Effort normal and breath sounds normal.  Musculoskeletal: She exhibits tenderness.  Left groin tender and pain with SLR left LE  Neurological: She is alert and oriented to person, place, and time.  Nursing note and vitals reviewed.   Urgent Care Course     Procedures (including critical care time)  Labs Review Labs Reviewed - No data to display  Imaging Review No results found.   Visual Acuity Review  Right Eye  Distance:   Left Eye Distance:   Bilateral Distance:    Right Eye Near:   Left Eye Near:    Bilateral Near:         MDM   1. Sciatica of left side   2. Left leg pain    Prednisone taper 40x2 30x2 20x2 10x2 then stop Norco 1-2 po q 6 hours prn pain #20  Follow up prn      Lysbeth Penner, FNP 08/25/16 1622

## 2016-08-28 ENCOUNTER — Ambulatory Visit (HOSPITAL_COMMUNITY)
Admission: EM | Admit: 2016-08-28 | Discharge: 2016-08-28 | Disposition: A | Discharge status: Home / Self Care | Payer: Medicare Other | Attending: Family Medicine | Admitting: Family Medicine

## 2016-08-28 ENCOUNTER — Encounter (HOSPITAL_COMMUNITY): Payer: Self-pay | Admitting: *Deleted

## 2016-08-28 ENCOUNTER — Ambulatory Visit (INDEPENDENT_AMBULATORY_CARE_PROVIDER_SITE_OTHER): Payer: Medicare Other

## 2016-08-28 DIAGNOSIS — M25552 Pain in left hip: Secondary | ICD-10-CM

## 2016-08-28 DIAGNOSIS — T148XXA Other injury of unspecified body region, initial encounter: Secondary | ICD-10-CM

## 2016-08-28 NOTE — ED Provider Notes (Signed)
[facility]    CSN: [id number] Arrival date & time: 08/28/16  1459     History   Chief Complaint Chief Complaint  Patient presents with  . Leg Pain    HPI Alicia Vasquez is a 76 y.o. female.   This is a 76 year old woman with a history of thrombophlebitis in her left calf who presents with pain in her left groin that began about a week ago after lifting a heavy box. She states that the pain in her left groin is much worse when she is bearing weight and that she does not have any pain 5.  She does have some pain in her left calf.      Past Medical History:  Diagnosis Date  . Allergy   . Anxiety   . Arthritis   . Blood transfusion without reported diagnosis   . Breast cancer ([place]) 07/12/13   invasive mammary carcinoma  . DVT (deep vein thrombosis) in pregnancy ([place])   . Hypertension   . Hypothyroid   . Pneumonia   . Radiation 11/21/13-01/07/14   Right Breast/supraclav.    Patient Active Problem List   Diagnosis Date Noted  . Lymphedema 09/17/2015  . Long term current use of anticoagulant therapy 08/23/2015  . DVT, lower extremity ([place]) 06/18/2015  . Bilateral knee pain 04/03/2015  . Arm edema 08/28/2014  . Vitamin D deficiency 04/23/2014  . Hyperparathyroidism ([place]) 04/23/2014  . Depression 07/18/2013  . Malignant neoplasm of upper-outer quadrant of right breast in female, estrogen receptor positive ([place]) 07/15/2013  . Encounter for therapeutic drug monitoring 07/05/2013  . Overactive bladder 01/30/2013  . Primary hypercoagulable state ([place]) 12/19/2012  . HTN (hypertension) 09/27/2011  . Hearing loss 09/27/2011  . Chest wall pain 09/27/2011  . Seasonal allergies 09/27/2011  . SOB (shortness of breath), related to deconditioning 08/26/2011  . Hypothyroid 08/26/2011    Past Surgical History:  Procedure Laterality Date  . biospy     female organs  . BREAST LUMPECTOMY WITH RADIOACTIVE SEED LOCALIZATION Right 09/09/2013   Procedure: BREAST  LUMPECTOMY WITH RADIOACTIVE SEED LOCALIZATION WITH AXILLARY NODE EXCISION;  Surgeon: [name], MD;  Location: [place];  Service: General;  Laterality: Right;  . DILATION AND CURETTAGE OF UTERUS    . RE-EXCISION OF BREAST LUMPECTOMY Right 09/24/2013   Procedure: RE-EXCISION OF RIGHT BREAST LUMPECTOMY;  Surgeon: [name], MD;  Location: [place];  Service: General;  Laterality: Right;  . removal of teeth  04/19/2012   13 teeth removed    OB History    No data available       Home Medications    Prior to Admission medications   Medication Sig Start Date End Date Taking? Authorizing Provider  acetaminophen (TYLENOL) 500 MG tablet Take 500 mg by mouth every 6 (six) hours as needed for mild pain, fever or headache. Reported on 09/08/2015    Historical Provider, MD  anastrozole (ARIMIDEX) 1 MG tablet Take 1 tablet (1 mg total) by mouth daily. 01/07/16   Chauncey Cruel, MD  Cholecalciferol (VITAMIN D3) 5000 UNITS TABS Take by mouth.    Historical Provider, MD  gabapentin (NEURONTIN) 100 MG capsule Take 1 capsule (100 mg total) by mouth at bedtime. 04/19/16   [name], MD  HYDROcodone-acetaminophen (NORCO) 5-325 MG tablet Take 1-2 tablets by mouth daily as needed for moderate pain. 08/25/16   Lysbeth Penner, FNP  ipratropium (ATROVENT) 0.06 % nasal spray Place 2 sprays into both nostrils  4 (four) times daily. 06/14/16   Billy Fischer, MD  JANTOVEN 5 MG tablet TAKE AS DIRECTED BY ANTICOAGULATION CLINIC 06/27/16   Chauncey Cruel, MD  levothyroxine (SYNTHROID) 125 MCG tablet Take 1 tablet (125 mcg total) by mouth daily before breakfast. Dispense as written: Synthroid LAST REFILL 04/15/16   [name], MD  lisinopril (PRINIVIL,ZESTRIL) 10 MG tablet TAKE 1 TABLET (10 MG TOTAL) BY MOUTH DAILY. 03/17/16   Chauncey Cruel, MD  predniSONE (DELTASONE) 10 MG tablet Take 4 po qd x 2 days then 3 po qd x 2 days then 2 po qd x 2 days then 1po qd  x  2 days then stop 08/25/16   Lysbeth Penner, FNP  warfarin (COUMADIN) 5 MG tablet Take 7.5 mg on M,W,F,Sun, and 10 mg on all other days. 12/25/15   Tereasa Coop, PA-C    Family History Family History  Problem Relation Age of Onset  . Cancer Brother     prostate  . Heart disease Brother   . Colon cancer Brother     Social History Social History  Substance Use Topics  . Smoking status: Never Smoker  . Smokeless tobacco: Never Used  . Alcohol use No     Allergies   Anesthetics, amide; Benadryl [diphenhydramine hcl]; Carbocaine [mepivacaine hcl]; Codeine; Epinephrine; Sulfa antibiotics; Vicodin [hydrocodone-acetaminophen]; and Penicillins   Review of Systems Review of Systems  Respiratory: Negative.   Musculoskeletal: Positive for gait problem.     Physical Exam Triage Vital Signs ED Triage Vitals  Enc Vitals Group     BP 08/28/16 1525 (!) 200/95     Pulse Rate 08/28/16 1525 78     Resp 08/28/16 1525 18     Temp 08/28/16 1525 98.6 F (37 C)     Temp Source 08/28/16 1525 Oral     SpO2 08/28/16 1525 99 %     Weight --      Height --      Head Circumference --      Peak Flow --      Pain Score 08/28/16 1526 10     Pain Loc --      Pain Edu? --      Excl. in [place]? --    No data found.   Updated Vital Signs BP (!) 200/95 (BP Location: Left Arm)   Pulse 78   Temp 98.6 F (37 C) (Oral)   Resp 18   SpO2 99%    Physical Exam  Constitutional: She is oriented to person, place, and time. She appears well-developed and well-nourished.  HENT:  Right Ear: External ear normal.  Left Ear: External ear normal.  Mouth/Throat: Oropharynx is clear and moist.  Eyes: Conjunctivae and EOM are normal. Pupils are equal, round, and reactive to light.  Neck: Normal range of motion. Neck supple.  Cardiovascular: Normal rate.   Pulmonary/Chest: Effort normal.  Musculoskeletal: She exhibits tenderness. She exhibits no edema or deformity.  Tenderness in the left suprapubic  area.  No significant swelling or tenderness in the left calf. Edema in the left foot.  Neurological: She is alert and oriented to person, place, and time.  Skin: Skin is warm and dry.  Nursing note and vitals reviewed.    UC Treatments / Results  Labs (all labs ordered are listed, but only abnormal results are displayed) Labs Reviewed - No data to display  EKG  EKG Interpretation None       Radiology X-rays are negative CLINICAL  DATA:  Left hip and groin pain. Left lower extremity numbness.  EXAM: DG HIP (WITH OR WITHOUT PELVIS) 2-3V LEFT  COMPARISON:  None.  FINDINGS: There is no evidence of acute fracture or dislocation. Left hip joint space width is preserved without significant arthropathic changes identified. No suspicious osseous lesion or soft tissue abnormality is seen.  IMPRESSION: Negative.   Electronically Signed   By: Logan Bores M.D.   On: 08/28/2016 16:09 Procedures Procedures (including critical care time)  Medications Ordered in UC Medications - No data to display   Initial Impression / Assessment and Plan / UC Course  I have reviewed the triage vital signs and the nursing notes.  Pertinent labs & imaging results that were available during my care of the patient were reviewed by me and considered in my medical decision making (see chart for details).     Final Clinical Impressions(s) / UC Diagnoses   Final diagnoses:  Muscle strain    New Prescriptions Current Discharge Medication List       [name], MD 08/28/16 1624

## 2016-08-28 NOTE — ED Triage Notes (Signed)
Pt    Reports  Pain l  Leg     Since  November   She      States    She  Tripped  About 2  And  1/2    Weeks  Ago         And had  Pain in  Her  Groin  Area   She  Reports     She  Was   Seen  At  [facility]    3  Days  Ago  Now  Reports  Pain  From   l     Leg   She  Ambulated  To   Exam  Room  With a   Slow steady  Gait

## 2016-08-28 NOTE — Discharge Instructions (Signed)
I want you to take the prednisone as described.  I will be on duty Monday, Tuesday and Thursday.  My phone is [phone]

## 2016-09-05 ENCOUNTER — Ambulatory Visit (HOSPITAL_COMMUNITY)
Admission: EM | Admit: 2016-09-05 | Discharge: 2016-09-05 | Disposition: A | Discharge status: Home / Self Care | Payer: Medicare Other | Attending: Family Medicine | Admitting: Family Medicine

## 2016-09-05 ENCOUNTER — Inpatient Hospital Stay: Admission: RE | Admit: 2016-09-05 | Payer: Medicare Other | Source: Ambulatory Visit

## 2016-09-05 ENCOUNTER — Encounter (HOSPITAL_COMMUNITY): Payer: Self-pay | Admitting: Emergency Medicine

## 2016-09-05 DIAGNOSIS — Z880 Allergy status to penicillin: Secondary | ICD-10-CM | POA: Diagnosis not present

## 2016-09-05 DIAGNOSIS — M5440 Lumbago with sciatica, unspecified side: Secondary | ICD-10-CM | POA: Diagnosis not present

## 2016-09-05 DIAGNOSIS — Z7901 Long term (current) use of anticoagulants: Secondary | ICD-10-CM | POA: Diagnosis not present

## 2016-09-05 DIAGNOSIS — M79605 Pain in left leg: Secondary | ICD-10-CM

## 2016-09-05 DIAGNOSIS — G8929 Other chronic pain: Secondary | ICD-10-CM

## 2016-09-05 DIAGNOSIS — Z79899 Other long term (current) drug therapy: Secondary | ICD-10-CM | POA: Diagnosis not present

## 2016-09-05 DIAGNOSIS — N390 Urinary tract infection, site not specified: Secondary | ICD-10-CM

## 2016-09-05 DIAGNOSIS — I890 Lymphedema, not elsewhere classified: Secondary | ICD-10-CM | POA: Insufficient documentation

## 2016-09-05 DIAGNOSIS — R8281 Pyuria: Secondary | ICD-10-CM

## 2016-09-05 LAB — POCT URINALYSIS DIP (DEVICE)
BILIRUBIN URINE: NEGATIVE
GLUCOSE, UA: NEGATIVE mg/dL
Hgb urine dipstick: NEGATIVE
KETONES UR: NEGATIVE mg/dL
NITRITE: NEGATIVE
PH: 6.5 (ref 5.0–8.0)
Protein, ur: NEGATIVE mg/dL
Specific Gravity, Urine: 1.015 (ref 1.005–1.030)
Urobilinogen, UA: 0.2 mg/dL (ref 0.0–1.0)

## 2016-09-05 MED ORDER — NITROFURANTOIN MONOHYD MACRO 100 MG PO CAPS: 100.0000 mg | ORAL_CAPSULE | Freq: Two times a day (BID) | ORAL | 0 refills | Status: DC

## 2016-09-05 NOTE — ED Provider Notes (Signed)
Alicia Vasquez    CSN: [id number] Arrival date & time: 09/05/16  1754     History   Chief Complaint Chief Complaint  Patient presents with  . Back Pain  . Leg Pain    HPI Alicia Vasquez is a 76 y.o. female.   The patient presented to the [facility] with a complaint of chronic back and leg pain. Patient feels that she had a manipulation that caused the problem in her left hip and knee. She states that the pain is in her entire left leg and tends to radiate down the side of the left thigh. She's using a cane to help get around but has a decided limp.  Patient also has been going to bathroom more frequently.  Patient also complains of increasing lymphedema in the right arm over the last week. She's had radiation to her right breast and upper chest and is followed by the [facility].      Past Medical History:  Diagnosis Date  . Allergy   . Anxiety   . Arthritis   . Blood transfusion without reported diagnosis   . Breast cancer ([place]) 07/12/13   invasive mammary carcinoma  . DVT (deep vein thrombosis) in pregnancy ([place])   . Hypertension   . Hypothyroid   . Pneumonia   . Radiation 11/21/13-01/07/14   Right Breast/supraclav.    Patient Active Problem List   Diagnosis Date Noted  . Lymphedema 09/17/2015  . Long term current use of anticoagulant therapy 08/23/2015  . DVT, lower extremity ([place]) 06/18/2015  . Bilateral knee pain 04/03/2015  . Arm edema 08/28/2014  . Vitamin D deficiency 04/23/2014  . Hyperparathyroidism ([place]) 04/23/2014  . Depression 07/18/2013  . Malignant neoplasm of upper-outer quadrant of right breast in female, estrogen receptor positive ([place]) 07/15/2013  . Encounter for therapeutic drug monitoring 07/05/2013  . Overactive bladder 01/30/2013  . Primary hypercoagulable state ([place]) 12/19/2012  . HTN (hypertension) 09/27/2011  . Hearing loss 09/27/2011  . Chest wall pain 09/27/2011  . Seasonal allergies 09/27/2011  . SOB (shortness of breath),  related to deconditioning 08/26/2011  . Hypothyroid 08/26/2011    Past Surgical History:  Procedure Laterality Date  . biospy     female organs  . BREAST LUMPECTOMY WITH RADIOACTIVE SEED LOCALIZATION Right 09/09/2013   Procedure: BREAST LUMPECTOMY WITH RADIOACTIVE SEED LOCALIZATION WITH AXILLARY NODE EXCISION;  Surgeon: [name], MD;  Location: [place];  Service: General;  Laterality: Right;  . DILATION AND CURETTAGE OF UTERUS    . RE-EXCISION OF BREAST LUMPECTOMY Right 09/24/2013   Procedure: RE-EXCISION OF RIGHT BREAST LUMPECTOMY;  Surgeon: [name], MD;  Location: [place];  Service: General;  Laterality: Right;  . removal of teeth  04/19/2012   13 teeth removed    OB History    No data available       Home Medications    Prior to Admission medications   Medication Sig Start Date End Date Taking? Authorizing Provider  acetaminophen (TYLENOL) 500 MG tablet Take 500 mg by mouth every 6 (six) hours as needed for mild pain, fever or headache. Reported on 09/08/2015    Historical Provider, MD  anastrozole (ARIMIDEX) 1 MG tablet Take 1 tablet (1 mg total) by mouth daily. 01/07/16   Chauncey Cruel, MD  Cholecalciferol (VITAMIN D3) 5000 UNITS TABS Take by mouth.    Historical Provider, MD  gabapentin (NEURONTIN) 100 MG capsule Take 1 capsule (100 mg total) by mouth at  bedtime. 04/19/16   [name], MD  ipratropium (ATROVENT) 0.06 % nasal spray Place 2 sprays into both nostrils 4 (four) times daily. 06/14/16   Billy Fischer, MD  JANTOVEN 5 MG tablet TAKE AS DIRECTED BY ANTICOAGULATION CLINIC 06/27/16   Chauncey Cruel, MD  levothyroxine (SYNTHROID) 125 MCG tablet Take 1 tablet (125 mcg total) by mouth daily before breakfast. Dispense as written: Synthroid LAST REFILL 04/15/16   [name], MD  lisinopril (PRINIVIL,ZESTRIL) 10 MG tablet TAKE 1 TABLET (10 MG TOTAL) BY MOUTH DAILY. 03/17/16   Chauncey Cruel, MD    nitrofurantoin, macrocrystal-monohydrate, (MACROBID) 100 MG capsule Take 1 capsule (100 mg total) by mouth 2 (two) times daily. 09/05/16   [name], MD  warfarin (COUMADIN) 5 MG tablet Take 7.5 mg on M,W,F,Sun, and 10 mg on all other days. 12/25/15   Tereasa Coop, PA-C    Family History Family History  Problem Relation Age of Onset  . Cancer Brother     prostate  . Heart disease Brother   . Colon cancer Brother     Social History Social History  Substance Use Topics  . Smoking status: Never Smoker  . Smokeless tobacco: Never Used  . Alcohol use No     Allergies   Anesthetics, amide; Benadryl [diphenhydramine hcl]; Carbocaine [mepivacaine hcl]; Codeine; Epinephrine; Sulfa antibiotics; Vicodin [hydrocodone-acetaminophen]; and Penicillins   Review of Systems Review of Systems  Constitutional: Negative.   HENT: Negative.   Respiratory: Negative.   Cardiovascular: Negative.   Gastrointestinal: Negative for abdominal pain.  Genitourinary: Positive for frequency. Negative for dysuria, flank pain and hematuria.  Musculoskeletal: Positive for gait problem.  Neurological: Positive for dizziness.     Physical Exam Triage Vital Signs ED Triage Vitals  Enc Vitals Group     BP 09/05/16 1813 (!) 164/60     Pulse Rate 09/05/16 1813 77     Resp 09/05/16 1813 16     Temp 09/05/16 1813 97.5 F (36.4 C)     Temp Source 09/05/16 1813 Oral     SpO2 09/05/16 1813 98 %     Weight --      Height --      Head Circumference --      Peak Flow --      Pain Score 09/05/16 1812 7     Pain Loc --      Pain Edu? --      Excl. in [place]? --    No data found.   Updated Vital Signs BP (!) 164/60 (BP Location: Right Arm)   Pulse 77   Temp 97.5 F (36.4 C) (Oral)   Resp 16   SpO2 98%    Physical Exam  Constitutional: She is oriented to person, place, and time. She appears well-developed and well-nourished.  HENT:  Right Ear: External ear normal.  Left Ear: External ear  normal.  Mouth/Throat: Oropharynx is clear and moist.  Eyes: Conjunctivae and EOM are normal. Pupils are equal, round, and reactive to light.  Neck: Normal range of motion. Neck supple.  Cardiovascular: Normal rate and regular rhythm.   Pulmonary/Chest: Effort normal and breath sounds normal.  Abdominal: Soft.  Musculoskeletal: She exhibits edema.  Patient is able to flex her hips normally. She does have some mild pedal edema bilaterally. There is no crepitus when she bends her knees.  Patient tends to favor her left leg as she uses her cane to get around.  Neurological: She is alert and oriented  to person, place, and time.  Skin: Skin is warm and dry.  Nursing note and vitals reviewed.    UC Treatments / Results  Labs (all labs ordered are listed, but only abnormal results are displayed) Labs Reviewed  POCT URINALYSIS DIP (DEVICE) - Abnormal; Notable for the following:       Result Value   Leukocytes, UA SMALL (*)    All other components within normal limits  URINE CULTURE    EKG  EKG Interpretation None       Radiology No results found.  Procedures Procedures (including critical care time)  Medications Ordered in UC Medications - No data to display   Initial Impression / Assessment and Plan / UC Course  I have reviewed the triage vital signs and the nursing notes.  Pertinent labs & imaging results that were available during my care of the patient were reviewed by me and considered in my medical decision making (see chart for details).     Final Clinical Impressions(s) / UC Diagnoses   Final diagnoses:  Pyuria  Chronic low back pain with sciatica, sciatica laterality unspecified, unspecified back pain laterality  Pain of left lower extremity    New Prescriptions New Prescriptions   NITROFURANTOIN, MACROCRYSTAL-MONOHYDRATE, (MACROBID) 100 MG CAPSULE    Take 1 capsule (100 mg total) by mouth 2 (two) times daily.     [name], MD 09/05/16  2010

## 2016-09-05 NOTE — ED Triage Notes (Signed)
The patient presented to the [facility] with a complaint of chronic back and leg pain.

## 2016-09-05 NOTE — Discharge Instructions (Signed)
You're going to need follow-up with an orthopedist regarding the left leg. At this point, medications are not helping you and so we need to look into alternative treatments to relieve the pain.

## 2016-09-07 ENCOUNTER — Telehealth (HOSPITAL_COMMUNITY): Payer: Self-pay | Admitting: Emergency Medicine

## 2016-09-07 LAB — URINE CULTURE

## 2016-09-07 NOTE — Telephone Encounter (Signed)
Patient called requesting script be changed to cipro.  Patient reports she usually gets cipro and wants it.  Notified dr Joseph Art.  Provider did give order to call in cipro 250 mg take 1 po BID.  # 6, no refills and ordered by dr Synetta Shadow lauenstein.  Called to [employer]  Notified patient.

## 2016-09-20 ENCOUNTER — Ambulatory Visit
Admission: RE | Admit: 2016-09-20 | Discharge: 2016-09-20 | Disposition: A | Discharge status: Home / Self Care | Payer: Medicare Other | Source: Ambulatory Visit | Attending: Oncology | Admitting: Oncology

## 2016-09-20 DIAGNOSIS — M8580 Other specified disorders of bone density and structure, unspecified site: Secondary | ICD-10-CM

## 2016-09-20 DIAGNOSIS — Z170 Estrogen receptor positive status [ER+]: Principal | ICD-10-CM

## 2016-09-20 DIAGNOSIS — M85851 Other specified disorders of bone density and structure, right thigh: Secondary | ICD-10-CM | POA: Diagnosis not present

## 2016-09-20 DIAGNOSIS — R928 Other abnormal and inconclusive findings on diagnostic imaging of breast: Secondary | ICD-10-CM | POA: Diagnosis not present

## 2016-09-20 DIAGNOSIS — C50411 Malignant neoplasm of upper-outer quadrant of right female breast: Secondary | ICD-10-CM

## 2016-09-20 HISTORY — DX: Personal history of irradiation: Z92.3

## 2016-09-22 ENCOUNTER — Telehealth: Payer: Self-pay

## 2016-09-22 ENCOUNTER — Ambulatory Visit (HOSPITAL_BASED_OUTPATIENT_CLINIC_OR_DEPARTMENT_OTHER): Payer: Medicare Other | Admitting: Adult Health

## 2016-09-22 ENCOUNTER — Other Ambulatory Visit (HOSPITAL_BASED_OUTPATIENT_CLINIC_OR_DEPARTMENT_OTHER): Payer: Medicare Other

## 2016-09-22 ENCOUNTER — Ambulatory Visit (HOSPITAL_BASED_OUTPATIENT_CLINIC_OR_DEPARTMENT_OTHER): Payer: Medicare Other

## 2016-09-22 VITALS — BP 132/51 | HR 98 | Temp 97.7°F | Resp 18 | Ht 64.0 in | Wt 210.9 lb

## 2016-09-22 DIAGNOSIS — I82403 Acute embolism and thrombosis of unspecified deep veins of lower extremity, bilateral: Secondary | ICD-10-CM | POA: Diagnosis not present

## 2016-09-22 DIAGNOSIS — Z170 Estrogen receptor positive status [ER+]: Secondary | ICD-10-CM

## 2016-09-22 DIAGNOSIS — C50411 Malignant neoplasm of upper-outer quadrant of right female breast: Secondary | ICD-10-CM

## 2016-09-22 DIAGNOSIS — R300 Dysuria: Secondary | ICD-10-CM

## 2016-09-22 DIAGNOSIS — M8580 Other specified disorders of bone density and structure, unspecified site: Secondary | ICD-10-CM

## 2016-09-22 LAB — URINALYSIS, MICROSCOPIC - CHCC
Bilirubin (Urine): NEGATIVE
Blood: NEGATIVE
Glucose: NEGATIVE mg/dL
KETONES: NEGATIVE mg/dL
Leukocyte Esterase: NEGATIVE
NITRITE: NEGATIVE
Protein: NEGATIVE mg/dL
Specific Gravity, Urine: 1.01 (ref 1.003–1.035)
Urobilinogen, UR: 0.2 mg/dL (ref 0.2–1)
pH: 7 (ref 4.6–8.0)

## 2016-09-22 LAB — CBC WITH DIFFERENTIAL/PLATELET
BASO%: 0.9 % (ref 0.0–2.0)
Basophils Absolute: 0.1 10*3/uL (ref 0.0–0.1)
EOS%: 3.2 % (ref 0.0–7.0)
Eosinophils Absolute: 0.2 10*3/uL (ref 0.0–0.5)
HEMATOCRIT: 40.3 % (ref 34.8–46.6)
HGB: 13.5 g/dL (ref 11.6–15.9)
LYMPH%: 28.4 % (ref 14.0–49.7)
MCH: 30.6 pg (ref 25.1–34.0)
MCHC: 33.5 g/dL (ref 31.5–36.0)
MCV: 91.4 fL (ref 79.5–101.0)
MONO#: 0.9 10*3/uL (ref 0.1–0.9)
MONO%: 16.2 % — AB (ref 0.0–14.0)
NEUT%: 51.3 % (ref 38.4–76.8)
NEUTROS ABS: 2.8 10*3/uL (ref 1.5–6.5)
PLATELETS: 177 10*3/uL (ref 145–400)
RBC: 4.41 10*6/uL (ref 3.70–5.45)
RDW: 14.3 % (ref 11.2–14.5)
WBC: 5.4 10*3/uL (ref 3.9–10.3)
lymph#: 1.5 10*3/uL (ref 0.9–3.3)
nRBC: 0 % (ref 0–0)

## 2016-09-22 LAB — PROTIME-INR
INR: 2.9 (ref 2.00–3.50)
Protime: 34.8 Seconds — ABNORMAL HIGH (ref 10.6–13.4)

## 2016-09-22 NOTE — Telephone Encounter (Signed)
INR today is 2.9.   Pt will maintain current prescribed dosage of 7.5mg  on Mon, Wed, Fri, and Sun;  10mg  on Tues, Thurs and Sat.  Pt verbalized understanding.  Next lab appt 5/24

## 2016-09-22 NOTE — Progress Notes (Signed)
[place]  Telephone:(336) [phone] Fax:(336) [phone]     ID: [place] OB: March 19, 1941  MR#: [id number]  URK#:[id number]  PCP: [name], MD GYN:   SU: [name] OTHER MD: Thea Silversmith, Hart Robinsons, Philemon Kingdom  CHIEF COMPLAINT: Estrogen receptor positive breast cancer  CURRENT TREATMENT:  anastrozole  BREAST CANCER HISTORY: From doctor [name]'s intake node 07/24/2013:  "76 y.o. female. Who presented with SOB and had a CT chest performed that revealed a right breast mass. Mammogram/ultrsound on 2/13 showed a mass in the 11 o'clock position in the right breast measuring 1.5 cm. Also noted was a right axillary LN measuring 1.6 cm. MRI not performed. Biopsy of mass and lymph done. 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 Lymph node + for metastatic carcinoma."  [On 09/09/2013 the patient underwent right lumpectomy and sentinel lymph node sampling. This showed (SZA [phone]) multifocal invasive ductal carcinoma, grade 1, the largest lesion measuring 1.8 cm, the second lesion 1.2 cm. One of 4 sentinel lymph nodes was positive, with extracapsular extension. Margins were positive. HER-2 was repeated and was again negative. Further surgery 09/16/2013 obtained clear margins.  Her subsequent history is as detailed below  INTERVAL HISTORY: Alicia Vasquez is here today for follow up of her h/o breast cancer.  She is not taking the Arimidex.  She says that Dr. Jana Hakim told her to stop taking it.  However, per his last note she was to continue taking it.  She has continued to have chronic pain issues with her back and hip and continues to see her PCP about it.  She says her PCP is at an urgent care and isn't readily available to her sometimes.  She would like her urine checked again as she previously had a urinary tract infection.  She is also has  some right arm slight swelling and range of motion difficulty.    REVIEW OF SYSTEMS: A detailed ROS was otherwise conducted and is non contributory.  PAST MEDICAL HISTORY: Past Medical History:  Diagnosis Date  . Allergy   . Anxiety   . Arthritis   . Blood transfusion without reported diagnosis   . Breast cancer ([place]) 07/12/13   invasive mammary carcinoma  . DVT (deep vein thrombosis) in pregnancy ([place])   . Hypertension   . Hypothyroid   . Personal history of radiation therapy   . Pneumonia   . Radiation 11/21/13-01/07/14   Right Breast/supraclav.    PAST SURGICAL HISTORY: Past Surgical History:  Procedure Laterality Date  . biospy     female organs  . BREAST LUMPECTOMY Right 08/30/2013  . BREAST LUMPECTOMY WITH RADIOACTIVE SEED LOCALIZATION Right 09/09/2013   Procedure: BREAST LUMPECTOMY WITH RADIOACTIVE SEED LOCALIZATION WITH AXILLARY NODE EXCISION;  Surgeon: [name], MD;  Location: [facility];  Service: General;  Laterality: Right;  . DILATION AND CURETTAGE OF UTERUS    . RE-EXCISION OF BREAST LUMPECTOMY Right 09/24/2013   Procedure: RE-EXCISION OF RIGHT BREAST LUMPECTOMY;  Surgeon: [name], MD;  Location: [facility];  Service: General;  Laterality: Right;  . removal of teeth  04/19/2012   13 teeth removed    FAMILY HISTORY Family History  Problem Relation Age of Onset  . Cancer Brother     prostate  . Heart disease Brother   . Colon cancer Brother    the patient's father died at the  age of 76 after an automobile accident. The patient's mother died at the age of 73. She was a Marine scientist here in [name] in the old [facility]. She was infected with polio and was confined to a wheelchair for a good part of her life. She eventually died of pneumonia. The patient had one brother, who died with prostate cancer. She had no sisters. There is no history of breast or ovarian cancer in the family.  GYNECOLOGIC HISTORY:    Menarche age 55, first live birth age 28, the patient is GX P1. She went through the change of life at age 6. 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 She is widowed. Currently she is staying with her friend [name],  Fontaine No is a retired Radio producer. The patient's son frank lives in [place]. He works an Engineer, technical sales. The patient has no grandchildren. She is a Tourist information centre manager but currently attends a [employer] with her friend [name]    ADVANCED DIRECTIVES: Not in place   HEALTH MAINTENANCE: Social History  Substance Use Topics  . Smoking status: Never Smoker  . Smokeless tobacco: Never Used  . Alcohol use No     Colonoscopy: Never  PAP:  Bone density: 10/01/2009; lowest T score -0.8  Lipid panel:  Allergies  Allergen Reactions  . Anesthetics, [place] Other (See Comments)    ELEVATE BLOOD PRESSURE  . Benadryl [Diphenhydramine Hcl]     dizziness  . Carbocaine [Mepivacaine Hcl] Other (See Comments)    Elevated blood pressure  . Codeine Nausea Only and Other (See Comments)    dizziness  . Epinephrine Other (See Comments)    Elevated blood pressure  . Sulfa Antibiotics Other (See Comments)    dizziness  . Vicodin [Hydrocodone-Acetaminophen] Nausea Only  . Penicillins Rash    Current Outpatient Prescriptions  Medication Sig Dispense Refill  . acetaminophen (TYLENOL) 500 MG tablet Take 500 mg by mouth every 6 (six) hours as needed for mild pain, fever or headache. Reported on 09/08/2015    . Cholecalciferol (VITAMIN D3) 5000 UNITS TABS Take by mouth.    Marland Kitchen JANTOVEN 5 MG tablet TAKE AS DIRECTED BY ANTICOAGULATION CLINIC 60 tablet 3  . levothyroxine (SYNTHROID) 125 MCG tablet Take 1 tablet (125 mcg total) by mouth daily before breakfast. Dispense as written: Synthroid LAST REFILL 90 tablet 3  . lisinopril (PRINIVIL,ZESTRIL) 10 MG tablet TAKE 1 TABLET (10 MG TOTAL) BY MOUTH DAILY. 90 tablet 1  . warfarin  (COUMADIN) 5 MG tablet Take 7.5 mg on M,W,F,Sun, and 10 mg on all other days. 60 tablet 0   No current facility-administered medications for this visit.     OBJECTIVE:  Vitals:   09/22/16 1516  BP: (!) 132/51  Pulse: 98  Resp: 18  Temp: 97.7 F (36.5 C)     Body mass index is 36.2 kg/m.    ECOG FS:2 - Symptomatic, <50% confined to bed GENERAL: Patient is a chronically ill appearing female in no acute distress HEENT:  Sclerae anicteric.  Oropharynx clear and moist. No ulcerations or evidence of oropharyngeal candidiasis. Neck is supple.  NODES:  No cervical, supraclavicular, or axillary lymphadenopathy palpated.  BREAST EXAM:  Right breast lumpectomy site without nodularity, swelling, masses, skin changes, left breast without nodules, masses, skin or nipple changes. LUNGS:  Clear to auscultation bilaterally.  No wheezes or rhonchi. HEART:  Regular rate and rhythm. No murmur appreciated. ABDOMEN:  Soft, nontender.  Positive, normoactive bowel sounds. No organomegaly palpated. MSK:  No focal spinal tenderness to palpation.  EXTREMITIES:  No peripheral edema.   SKIN:  Clear with no obvious rashes or skin changes. No nail dyscrasia. NEURO:  Nonfocal. Well oriented.  Appropriate affect.      LAB RESULTS:  CMP     Component Value Date/Time   NA 141 03/17/2016 1450   K 4.1 03/17/2016 1450   CL 105 08/19/2015 1602   CO2 25 03/17/2016 1450   GLUCOSE 121 03/17/2016 1450   BUN 19.3 03/17/2016 1450   CREATININE 1.3 (H) 03/17/2016 1450   CALCIUM 10.8 (H) 03/17/2016 1450   PROT 7.1 03/17/2016 1450   ALBUMIN 3.9 03/17/2016 1450   AST 18 03/17/2016 1450   ALT 20 03/17/2016 1450   ALKPHOS 89 03/17/2016 1450   BILITOT 0.32 03/17/2016 1450   GFRNONAA 75 08/19/2015 1602   GFRAA 87 08/19/2015 1602    I No results found for: SPEP  Lab Results  Component Value Date   WBC 5.4 09/22/2016   NEUTROABS 2.8 09/22/2016   HGB 13.5 09/22/2016   HCT 40.3 09/22/2016   MCV 91.4 09/22/2016    PLT 177 09/22/2016      Chemistry      Component Value Date/Time   NA 141 03/17/2016 1450   K 4.1 03/17/2016 1450   CL 105 08/19/2015 1602   CO2 25 03/17/2016 1450   BUN 19.3 03/17/2016 1450   CREATININE 1.3 (H) 03/17/2016 1450      Component Value Date/Time   CALCIUM 10.8 (H) 03/17/2016 1450   ALKPHOS 89 03/17/2016 1450   AST 18 03/17/2016 1450   ALT 20 03/17/2016 1450   BILITOT 0.32 03/17/2016 1450       No results found for: LABCA2  No components found for: LABCA125   Recent Labs Lab 09/22/16 1432  INR 2.90    Urinalysis    Component Value Date/Time   LABSPEC 1.010 09/22/2016 1553   PHURINE 7.0 09/22/2016 1553   PHURINE 6.5 09/05/2016 1951   GLUCOSEU Negative 09/22/2016 1553   HGBUR Negative 09/22/2016 1553   HGBUR NEGATIVE 09/05/2016 1951   BILIRUBINUR Negative 09/22/2016 1553   KETONESUR Negative 09/22/2016 [place] 09/05/2016 1951   PROTEINUR Negative 09/22/2016 1553   PROTEINUR NEGATIVE 09/05/2016 1951   UROBILINOGEN 0.2 09/22/2016 1553   NITRITE Negative 09/22/2016 1553   NITRITE NEGATIVE 09/05/2016 1951   LEUKOCYTESUR Negative 09/22/2016 1553   Results for RONELL, BOLDIN (MRN [id number]) as of 06/23/2016 17:38  Ref. Range 03/24/2016 15:15 04/07/2016 14:40 04/20/2016 14:33 04/27/2016 14:34 05/05/2016 14:46 05/19/2016 14:27 05/26/2016 14:09 06/23/2016 14:25  INR Latest Ref Range: 2.00 - 3.50  1.90 (L) 2.60 1.30 (L) 3.70 (H) 2.60 3.80 (H) 1.80 (L) 2.90   STUDIES: Dg Bone Density  Result Date: 09/20/2016 EXAM: DUAL X-RAY ABSORPTIOMETRY (DXA) FOR BONE MINERAL DENSITY IMPRESSION: Referring Physician:  Lurline Del PATIENT: Name: Alicia Vasquez, Alicia Vasquez Patient ID: [id number] Birth Date: 1940-12-29 Height: 63.0 in. Sex: Female Measured: 09/20/2016 Weight: 206.0 lbs. Indications: Advanced Age, Breast Cancer History, Caucasian, Height Loss (781.91), History of Osteopenia, Hypothyroid, Synthroid, Secondary Osteoporosis Fractures: Elbow, Foot,  Forearm Treatments: Vitamin D (E933.5) ASSESSMENT: The BMD measured at Femur Neck Right is 0.738 g/cm2 with a T-score of -2.2. This patient is considered osteopenic according to [facility]) criteria. There has been a statistically significant decrease in BMD of left hip since prior exam dated 09/03/2014. Lumbar spine was not utilized due to  advanced degenerative changes. Site Region Measured Date Measured Age YA BMD Significant CHANGE T-score DualFemur Neck Right 09/20/2016 77.1 -2.2 0.738 g/cm2 Right Forearm Radius 33% 09/20/2016 77.1 -0.2 0.874 g/cm2 World Health Organization [facility]) criteria for post-menopausal, Caucasian Women: Normal       T-score at or above -1 SD Osteopenia   T-score between -1 and -2.5 SD Osteoporosis T-score at or below -2.5 SD RECOMMENDATION: [place] recommends that FDA-approved medical therapies be considered in postmenopausal women and men age 80 or older with a: 1. Hip or vertebral (clinical or morphometric) fracture. 2. T-score of <-2.5 at the spine or hip. 3. Ten-year fracture probability by FRAX of 3% or greater for hip fracture or 20% or greater for major osteoporotic fracture. All treatment decisions require clinical judgment and consideration of individual patient factors, including patient preferences, co-morbidities, previous drug use, risk factors not captured in the FRAX model (e.g. falls, vitamin D deficiency, increased bone turnover, interval significant decline in bone density) and possible under - or over-estimation of fracture risk by FRAX. All patients should ensure an adequate intake of dietary calcium (1200 mg/d) and vitamin D (800 IU daily) unless contraindicated. FOLLOW-UP: People with diagnosed cases of osteoporosis or at high risk for fracture should have regular bone mineral density tests. For patients eligible for Medicare, routine testing is allowed once every 2 years. The testing frequency can be increased to one year  for patients who have rapidly progressing disease, those who are receiving or discontinuing medical therapy to restore bone mass, or have additional risk factors. FRAX* 10-year Probability of Fracture Based on femoral neck BMD: DualFemur (Right) Major Osteoporotic Fracture: 14.0% Hip Fracture:                3.9% Population:                  [place] (Caucasian) Risk Factors:                Secondary Osteoporosis *FRAX is a [place] for Metabolic Bone Disease, a [place] (WHO) [facility]. ASSESSMENT: The probability of a major osteoporotic fracture is 14.0 % within the next ten years. The probability of a hip fracture is 3.9 % within the next ten years. Electronically Signed   By: Earle Gell M.D.   On: 09/20/2016 15:11   Mm Diag Breast Tomo Bilateral  Result Date: 09/20/2016 CLINICAL DATA:  Right lumpectomy.  Annual mammography. EXAM: 2D DIGITAL DIAGNOSTIC BILATERAL MAMMOGRAM WITH CAD AND ADJUNCT TOMO COMPARISON:  Previous exam(s). ACR Breast Density Category c: The breast tissue is heterogeneously dense, which may obscure small masses. FINDINGS: The right lumpectomy site is stable. No suspicious interval changes. Mammographic images were processed with CAD. IMPRESSION: No mammographic evidence of malignancy. RECOMMENDATION: Annual diagnostic mammography. I have discussed the findings and recommendations with the patient. Results were also provided in writing at the conclusion of the visit. If applicable, a reminder letter will be sent to the patient regarding the next appointment. BI-RADS CATEGORY  2: Benign. Electronically Signed   By: Dorise Bullion III M.D   On: 09/20/2016 15:32   Dg Hip Unilat With Pelvis 2-3 Views Left  Result Date: 08/28/2016 CLINICAL DATA:  Left hip and groin pain. Left lower extremity numbness. EXAM: DG HIP (WITH OR WITHOUT PELVIS) 2-3V LEFT COMPARISON:  None. FINDINGS: There is no evidence of acute  fracture or dislocation. Left hip joint space width is preserved without significant  arthropathic changes identified. No suspicious osseous lesion or soft tissue abnormality is seen. IMPRESSION: Negative. Electronically Signed   By: Logan Bores M.D.   On: 08/28/2016 16:09    ASSESSMENT: 76 y.o. [place] woman status post right breast upper outer quadrant lumpectomy and sentinel lymph node sampling 09/09/2013 for an mpT1c pN1a, stage IIA invasive ductal carcinoma, estrogen and progesterone receptor both 100% positive with strong staining intensity, MIB-1 of 17% and no HER-2 amplification  (1) additional surgery for margin clearance 09/16/2013 obtained negative margins  (2) Oncotype DX recurrence score of 4 predicts a risk of outside the breast recurrence within 10 years of 7% if the patient's only systemic therapy is tamoxifen for 5 years. It also predicts no benefit from chemotherapy  (3) adjuvant radiation completed 01/07/2014  (4) anastrozole started 02/27/2014 stopped within 2 weeks because of arm swelling.   (a) bone density April 2016 showed osteopenia, with a t-score of -1.6  (b) anastrozole resumed 12/17/2015  (5) history of left lower extremity DVT 11/23/2012, initially on [place] ovarian, which causes chest pain, switch to Coumadin July 2014  PLAN: Jansen is doing moderately well today.  She is planning on looking for a new PCP.  She will restart the anastrozle and let us know if she has any difficulty with it.  I sent referral to PT for her arm and additionally see if they can help with her back.  She will return in 3 months to see Dr. Jana Hakim.    She knows to call for any problems that may develop before her next visit here.  A total of (30) minutes of face-to-face time was spent with this patient with greater than 50% of that time in counseling and care-coordination.    Scot Dock, NP   09/26/2016 8:22 AM

## 2016-09-22 NOTE — Progress Notes (Signed)
Current Jantoven MWF 7.5 mg, TT 10 mg, none on SS

## 2016-09-26 ENCOUNTER — Other Ambulatory Visit: Payer: Self-pay

## 2016-09-26 ENCOUNTER — Encounter: Payer: Self-pay | Admitting: Adult Health

## 2016-09-26 ENCOUNTER — Telehealth: Payer: Self-pay

## 2016-09-26 MED ORDER — ALENDRONATE SODIUM 70 MG PO TABS: 70.0000 mg | ORAL_TABLET | ORAL | 0 refills | Status: DC

## 2016-09-26 MED ORDER — LISINOPRIL 10 MG PO TABS: ORAL_TABLET | ORAL | 1 refills | Status: DC

## 2016-09-26 NOTE — Telephone Encounter (Signed)
Called pt to notify of her ua results being negative. Pt states that she is waiting to hear if the ortho doctor will be calling her soon. Pt states that her pcp was going to refer her to [name] to evaluate her pinched nerve on her back. Told pt to follow up with his office and if not we can try to set her up for a referral to ortho dr. Abbott Pao states that she recently had her mammogram and bone density. Told pt that her mammogram was okay and to follow up next year. Her bone density, however, showed some osteopenia. Pt on vitamind d3 at this time. Will notify [name],Alicia Vasquez to look over bone density results and compare from last yr and see what she recommends pt to take. Pt verbalized understanding. Told pt that a referral for PT for her arm was sent and if she does not hear anything from them this week, she will need to call us back to follow up on the status. Pt appreciative of call and has no further concerns at this time.

## 2016-09-26 NOTE — Telephone Encounter (Signed)
Per [name] NP, pt to start with biphosphatase to prevent osteopenia (result from her recent bone scan). Sent escript for fosamax at her preferred pharmacy. Pt will take fosmax once a week on an empty stomach, with a full glass of water. Instructed pt that she will need to take synthroid and fosamax 30 minutes apart to prevent any absorption issues. Pt verbalized understanding. Encouraged pt to pick the same day, once a week to take fosamax. Pt states that she will take it every Monday. Pt to call with any side effects. May cause some heartburn so pt needs to prevent from lying down for 30 min after taking the medication. Pt requested to also have her lisinopril refilled. Told pt to call [place] prior to picking up medication to make sure that it is ready for pick up.

## 2016-09-29 ENCOUNTER — Ambulatory Visit: Payer: Medicare Other | Attending: Adult Health | Admitting: Physical Therapy

## 2016-09-29 DIAGNOSIS — M25611 Stiffness of right shoulder, not elsewhere classified: Secondary | ICD-10-CM | POA: Diagnosis not present

## 2016-09-29 DIAGNOSIS — R5381 Other malaise: Secondary | ICD-10-CM | POA: Diagnosis not present

## 2016-09-29 DIAGNOSIS — M5442 Lumbago with sciatica, left side: Secondary | ICD-10-CM | POA: Insufficient documentation

## 2016-09-29 DIAGNOSIS — I890 Lymphedema, not elsewhere classified: Secondary | ICD-10-CM | POA: Insufficient documentation

## 2016-09-29 DIAGNOSIS — M79601 Pain in right arm: Secondary | ICD-10-CM | POA: Diagnosis not present

## 2016-09-29 NOTE — Therapy (Signed)
[place], [name], [address] Phone: [phone]   Fax:  [phone]  Physical Therapy Evaluation  Patient Details  Name: Alicia Vasquez MRN: [id number] Date of Birth: February 10, 1941 Referring Provider: Tivis Ringer   Encounter Date: 09/29/2016      PT End of Session - 09/29/16 1758    Visit Number 1   Number of Visits 9   Date for PT Re-Evaluation 11/18/16   PT Start Time 1110   PT Stop Time 1145   PT Time Calculation (min) 35 min   Activity Tolerance Patient tolerated treatment well   Behavior During Therapy [facility] for tasks assessed/performed      Past Medical History:  Diagnosis Date  . Allergy   . Anxiety   . Arthritis   . Blood transfusion without reported diagnosis   . Breast cancer ([place]) 07/12/13   invasive mammary carcinoma  . DVT (deep vein thrombosis) in pregnancy ([place])   . Hypertension   . Hypothyroid   . Personal history of radiation therapy   . Pneumonia   . Radiation 11/21/13-01/07/14   Right Breast/supraclav.    Past Surgical History:  Procedure Laterality Date  . biospy     female organs  . BREAST LUMPECTOMY Right 08/30/2013  . BREAST LUMPECTOMY WITH RADIOACTIVE SEED LOCALIZATION Right 09/09/2013   Procedure: BREAST LUMPECTOMY WITH RADIOACTIVE SEED LOCALIZATION WITH AXILLARY NODE EXCISION;  Surgeon: [name], MD;  Location: [place];  Service: General;  Laterality: Right;  . DILATION AND CURETTAGE OF UTERUS    . RE-EXCISION OF BREAST LUMPECTOMY Right 09/24/2013   Procedure: RE-EXCISION OF RIGHT BREAST LUMPECTOMY;  Surgeon: [name], MD;  Location: [facility];  Service: General;  Laterality: Right;  . removal of teeth  04/19/2012   13 teeth removed    There were no vitals filed for this visit.       Subjective Assessment - 09/29/16 1111    Subjective pt c/o pain in right axilla and breast swelling.  She has had a  pinched nerve since December and sometimes has pain and numbness down her leg. She has not heard yet from the orthopedic doctor.    Pertinent History breast cancer in 2015 with lumpectomy and 4 sentinel nodes with second surgery to get clear margins.  She underwent  radiation.  Multiple complaints of pain in back, left legs neck right  shoulder and arm. She had a near fall in December and twisted her back and caught herself with right arm and has pain since. Pt reports she has not followed up with community exercise program   Patient Stated Goals get some relief from the pain in her right axilla    Currently in Pain? Yes   Pain Score 8    Pain Location Axilla   Pain Orientation Right   Pain Descriptors / Indicators Tightness;Aching;Numbness   Pain Type Chronic pain   Pain Radiating Towards under the breast and down into side    Pain Onset More than a month ago   Pain Frequency Constant   Aggravating Factors  using it    Pain Relieving Factors tylenol (pt takes it every day)    Effect of Pain on Daily Activities cannot do household chores    Pain Score 8   Pain Location Back   Pain Orientation Left   Pain Descriptors / Indicators Aching;Burning;Tightness;Throbbing   Pain Type Chronic pain   Pain Radiating Towards down into leg  [facility] PT Assessment - 09/29/16 0001      Assessment   Medical Diagnosis breast cancer    Referring Provider [place]    Onset Date/Surgical Date 09/09/13   Hand Dominance Right   Prior Therapy yes prolonged episode of PT in 2016 for exercise and pain control . for lypmhedema of right arm in 2017      Precautions   Precautions Other (comment)   Precaution Comments cancer with previous radiation. no chemotherapy      Restrictions   Weight Bearing Restrictions No     Balance Screen   Has the patient fallen in the past 6 months No   Has the patient had a decrease in activity level because of a fear of falling?  No   Is the  patient reluctant to leave their home because of a fear of falling?  No     Home Social worker Private residence   Living Arrangements Non-relatives/Friends   Available Help at Discharge Available PRN/intermittently     Prior Function   Level of [place] Retired   Leisure taking care of personal business, does not exercise regularly   "I get so weak and has shortness of breath"     Cognition   Overall Cognitive Status Within Functional Limits for tasks assessed     Observation/Other Assessments   Observations dry skin and patches at elbow, well healed incision at axilla with small full area under it.  Right breast appears to be about 25% larger than left and has fullness at bottom of breast and just below healed incision at lateral breast    Skin Integrity dry but no open area on arm.   Other Surveys  Other Surveys   Quick DASH  52.27     Observation/Other Assessments-Edema    Edema --  yes     Sensation   Light Touch Not tested     Coordination   Gross Motor Movements are Fluid and Coordinated Not tested  slow but functional      Sit to Stand   Comments 6 repetitions of sit to stan din 30 seconds with c/o increased pain in left leg      Posture/Postural Control   Posture/Postural Control Postural limitations   Postural Limitations Rounded Shoulders;Forward head;Increased lumbar lordosis;Decreased thoracic kyphosis   Posture Comments hump at C7 with flat thoracic spine below it.      AROM   Right Shoulder Flexion 145 Degrees   Right Shoulder ABduction 110 Degrees   Right Shoulder External Rotation 60 Degrees   Left Shoulder Flexion 160 Degrees   Left Shoulder ABduction 145 Degrees   Left Shoulder External Rotation 80 Degrees   Lumbar Flexion very limited lumbar flexion. lordosis maintained with trunk forward flexion   Lumbar - Right Side Bend very limited right side bending with c/o pain    Lumbar - Left Side Bend about  25 % available    Lumbar - Right Rotation 75%   Lumbar - Left Rotation 75%      Strength   Overall Strength Due to pain   Overall Strength Comments unable to isometrically test due to multiple c/o pain,    Right/Left Hip --     Palpation   Palpation comment very tender to firm palpation at right axilla      Special Tests    Special Tests Hip Special Tests   Hip Special Tests  Trendelenberg Test  Trendelenburg Test   Findings Positive   Side Left              [name] - 09/29/16 0001    Open a tight or new jar No difficulty   Do heavy household chores (wash walls, wash floors) Severe difficulty   Carry a shopping bag or briefcase Moderate difficulty   Wash your back Moderate difficulty   Use a knife to cut food Mild difficulty   Recreational activities in which you take some force or impact through your arm, shoulder, or hand (golf, hammering, tennis) Severe difficulty   During the past week, to what extent has your arm, shoulder or hand problem interfered with your normal social activities with family, friends, neighbors, or groups? Quite a bit   During the past week, to what extent has your arm, shoulder or hand problem limited your work or other regular daily activities Modererately   Arm, shoulder, or hand pain. Severe   Tingling (pins and needles) in your arm, shoulder, or hand Moderate   Difficulty Sleeping Moderate difficulty   DASH Score 52.27 %                        Short Term Clinic Goals - 10/10/14 1154      CC Short Term Goal  #1   Title pt will report pain in low back at 2/10  varies, but has been as low at 2/10 at one point   Status Achieved     CC Short Term Goal  #2   Title Increase Berg Balance Assessment score to 40/56   Status Achieved             Long Term Clinic Goals - 09/29/16 1812      CC Long Term Goal  #1   Title Pt will report a 50% reduction in low back pain    Time 4   Period Weeks   Status New      CC Long Term Goal  #2   Title Pt will have/know how to get  a compression bra  that she is able to use to control her lymphedema    Time 4   Period Weeks   Status New     CC Long Term Goal  #3   Title Pt will have a decrease in Quick DASH to < 38 demonstrating functional improvment of right arm     Baseline 52.27 o 09/29/2016   Time 4   Period Weeks   Status New     CC Long Term Goal  #4   Title Pt will report a 50 % decrease in right upper quadrant pain    Time 4   Status New     CC Long Term Goal  #5   Title Pt will increase repetitions fo sit to stand in 30 seconds to 8 demonstrating and increase in generalized strength and function   Baseline 6 in 30 seconds limited by leg pain   Time 4   Period Weeks   Status New            Plan - 09/29/16 1800    Rehab Potential Good   Clinical Impairments Affecting Rehab Potential previous radiation, 4 nodes removed,  chronic pain limiting her activities    PT Frequency 2x / week   PT Duration 4 weeks   PT Treatment/Interventions Manual techniques;ADLs/Self Care Home Management;Patient/family education;Orthotic Fit/Training;Taping;Electrical Stimulation;Therapeutic activities;Therapeutic exercise;Passive range of motion;Scar mobilization;Manual lymph  drainage;Compression bandaging;Moist Heat;Neuromuscular re-education   PT Next Visit Plan Measure arm circumference, moist heat to low back with core exercise and range of motion, manual lymph drainage to right breast and soft tissue work to right axilla and lateral  Consider compression bra and show options    Consulted and Agree with Plan of Care Patient   PT Plan Pt returns to PT with c/o pain and swelling in her breast and c/o back pain that has increased since a near fall in [place].  She has a compression sleeve at home and says she wears it periodically, the last time being last weekend. She continues to have mulltiple complaints of pain. She has not heard from orthopedic doctor yet,  but plans to follow up on that. She has not followed up on community exercise program.  She has right breast pain and swelling and postural issues that can be addressed in this episode with modalities and  exercise for her back pain also She does not have a compression bra and would benefit from that as well.  Because of her comorbities and evolving status of symptoms, this is a moderately complex eval       Patient will benefit from skilled therapeutic intervention in order to improve the following deficits and impairments:  Obesity, Increased edema, Increased fascial restricitons, Impaired UE functional use, Pain, Decreased mobility, Difficulty walking, Increased muscle spasms, Decreased range of motion, Impaired perceived functional ability, Postural dysfunction, Impaired flexibility  Visit Diagnosis: Lymphedema, not elsewhere classified - Plan: PT plan of care cert/re-cert  Pain in right arm - Plan: PT plan of care cert/re-cert  Stiffness of right shoulder, not elsewhere classified - Plan: PT plan of care cert/re-cert  Physical deconditioning - Plan: PT plan of care cert/re-cert  Low back pain with left-sided sciatica, unspecified back pain laterality, unspecified chronicity - Plan: PT plan of care cert/re-cert      G-Codes - 16/96/78 1817    Functional Assessment Tool Used (Outpatient Only) Quick DASH   Functional Limitation Carrying, moving and handling objects   Carrying, Moving and Handling Objects Current Status (L3810) At least 40 percent but less than 60 percent impaired, limited or restricted   Carrying, Moving and Handling Objects Goal Status (F7510) At least 20 percent but less than 40 percent impaired, limited or restricted       Problem List Patient Active Problem List   Diagnosis Date Noted  . Lymphedema 09/17/2015  . Long term current use of anticoagulant therapy 08/23/2015  . DVT, lower extremity ([place]) 06/18/2015  . Bilateral knee pain 04/03/2015  . Arm edema  08/28/2014  . Vitamin D deficiency 04/23/2014  . Hyperparathyroidism ([place]) 04/23/2014  . Depression 07/18/2013  . Malignant neoplasm of upper-outer quadrant of right breast in female, estrogen receptor positive ([place]) 07/15/2013  . Encounter for therapeutic drug monitoring 07/05/2013  . Overactive bladder 01/30/2013  . Primary hypercoagulable state ([place]) 12/19/2012  . HTN (hypertension) 09/27/2011  . Hearing loss 09/27/2011  . Chest wall pain 09/27/2011  . Seasonal allergies 09/27/2011  . SOB (shortness of breath), related to deconditioning 08/26/2011  . Hypothyroid 08/26/2011   [name]. Owens Shark PT  [name] 09/29/2016, 6:19 PM  [place], [name], [address] Phone: [phone]   Fax:  [phone]  Name: Alicia Vasquez MRN: [id number] Date of Birth: 14-Dec-1940

## 2016-09-30 ENCOUNTER — Ambulatory Visit: Payer: Medicare Other | Admitting: Physical Therapy

## 2016-10-04 ENCOUNTER — Ambulatory Visit: Payer: Medicare Other | Admitting: Physical Therapy

## 2016-10-04 DIAGNOSIS — R5381 Other malaise: Secondary | ICD-10-CM

## 2016-10-04 DIAGNOSIS — I890 Lymphedema, not elsewhere classified: Secondary | ICD-10-CM

## 2016-10-04 DIAGNOSIS — M79601 Pain in right arm: Secondary | ICD-10-CM | POA: Diagnosis not present

## 2016-10-04 DIAGNOSIS — M5442 Lumbago with sciatica, left side: Secondary | ICD-10-CM | POA: Diagnosis not present

## 2016-10-04 DIAGNOSIS — M25611 Stiffness of right shoulder, not elsewhere classified: Secondary | ICD-10-CM

## 2016-10-04 NOTE — Therapy (Signed)
[place], [name], [address] Phone: [phone]   Fax:  [phone]  Physical Therapy Treatment  Patient Details  Name: Alicia Vasquez MRN: [id number] Date of Birth: 10-19-1940 Referring Provider: Tivis Ringer   Encounter Date: 10/04/2016      PT End of Session - 10/04/16 1626    Visit Number 2   Number of Visits 9   Date for PT Re-Evaluation 11/18/16   PT Start Time 0737   PT Stop Time 1515   PT Time Calculation (min) 38 min   Activity Tolerance Patient tolerated treatment well   Behavior During Therapy [facility] for tasks assessed/performed      Past Medical History:  Diagnosis Date  . Allergy   . Anxiety   . Arthritis   . Blood transfusion without reported diagnosis   . Breast cancer ([place]) 07/12/13   invasive mammary carcinoma  . DVT (deep vein thrombosis) in pregnancy ([place])   . Hypertension   . Hypothyroid   . Personal history of radiation therapy   . Pneumonia   . Radiation 11/21/13-01/07/14   Right Breast/supraclav.    Past Surgical History:  Procedure Laterality Date  . biospy     female organs  . BREAST LUMPECTOMY Right 08/30/2013  . BREAST LUMPECTOMY WITH RADIOACTIVE SEED LOCALIZATION Right 09/09/2013   Procedure: BREAST LUMPECTOMY WITH RADIOACTIVE SEED LOCALIZATION WITH AXILLARY NODE EXCISION;  Surgeon: [name], MD;  Location: [place];  Service: General;  Laterality: Right;  . DILATION AND CURETTAGE OF UTERUS    . RE-EXCISION OF BREAST LUMPECTOMY Right 09/24/2013   Procedure: RE-EXCISION OF RIGHT BREAST LUMPECTOMY;  Surgeon: [name], MD;  Location: [facility];  Service: General;  Laterality: Right;  . removal of teeth  04/19/2012   13 teeth removed    There were no vitals filed for this visit.      Subjective Assessment - 10/04/16 1623    Subjective Pt says she got bit by a tick over the weekend.  She is having pain  in her back and leg and forgot to take her tylenol today    Pertinent History breast cancer in 2015 with lumpectomy and 4 sentinel nodes with second surgery to get clear margins.  She underwent  radiation.  Multiple complaints of pain in back, left legs neck right  shoulder and arm. She had a near fall in December and twisted her back and caught herself with right arm and has pain since. Pt reports she has not followed up with community exercise program   Patient Stated Goals get some relief from the pain in her right axilla    Currently in Pain? Yes   Pain Score 8    Pain Location Back   Pain Orientation Left   Pain Descriptors / Indicators Aching   Pain Type Chronic pain   Pain Radiating Towards down into the left leg    Pain Onset More than a month ago   Pain Frequency Constant   Aggravating Factors  walking .. she has to use a cane now   Pain Relieving Factors moist heat, tylenol                LYMPHEDEMA/ONCOLOGY QUESTIONNAIRE - 10/04/16 1448      Right Upper Extremity Lymphedema   10 cm Proximal to Olecranon Process 35 cm   Olecranon Process 28.5 cm   15 cm Proximal to Ulnar Styloid Process 26.5 cm   10  cm Proximal to Ulnar Styloid Process 24 cm   Just Proximal to Ulnar Styloid Process 17 cm   Across Hand at [employer] 19.5 cm   At [place] of 2nd Digit 6.5 cm     Left Upper Extremity Lymphedema   10 cm Proximal to Olecranon Process 32 cm   Olecranon Process 26 cm   15 cm Proximal to Ulnar Styloid Process 24.5 cm   10 cm Proximal to Ulnar Styloid Process 23 cm   Just Proximal to Ulnar Styloid Process 16 cm   Across Hand at [employer] 19 cm   At [place] of 2nd Digit 6 cm                  [facility] Adult PT Treatment/Exercise - 10/04/16 0001      Moist Heat Therapy   Number Minutes Moist Heat 10 Minutes   Moist Heat Location Lumbar Spine     Manual Therapy   Manual Therapy Manual Lymphatic Drainage (MLD)   Manual Lymphatic Drainage (MLD) deep breaths,  short neck, superficial shoulders, anterior and posterior interaxillary anastamosis, right axiila , lateral chest , right shoulder arm and forearm with return along pathways                         Long Term Clinic Goals - 09/29/16 1812      CC Long Term Goal  #1   Title Pt will report a 50% reduction in low back pain    Time 4   Period Weeks   Status New     CC Long Term Goal  #2   Title Pt will have/know how to get  a compression bra  that she is able to use to control her lymphedema    Time 4   Period Weeks   Status New     CC Long Term Goal  #3   Title Pt will have a decrease in Quick DASH to < 38 demonstrating functional improvment of right arm     Baseline 52.27 o 09/29/2016   Time 4   Period Weeks   Status New     CC Long Term Goal  #4   Title Pt will report a 50 % decrease in right upper quadrant pain    Time 4   Status New     CC Long Term Goal  #5   Title Pt will increase repetitions fo sit to stand in 30 seconds to 8 demonstrating and increase in generalized strength and function   Baseline 6 in 30 seconds limited by leg pain   Time 4   Period Weeks   Status New            Plan - 10/04/16 1628    Clinical Impression Statement Increased pain in back. Pt has not heard from orthopedic surgeon. She has called about getting a compression bra but has to wait about a week.  She does have fullness in her right arm and is not wearing her compression sleeve.  Pt felt better after moist heat to low back and MLD to right upper quadrant    Clinical Impairments Affecting Rehab Potential previous radiation, 4 nodes removed,  chronic pain limiting her activities    PT Frequency 2x / week   PT Duration 4 weeks   PT Treatment/Interventions Manual techniques;ADLs/Self Care Home Management;Patient/family education;Orthotic Fit/Training;Taping;Electrical Stimulation;Therapeutic activities;Therapeutic exercise;Passive range of motion;Scar mobilization;Manual lymph  drainage;Compression bandaging;Moist Heat;Neuromuscular re-education  PT Next Visit Plan  moist heat to low back with core exercise and range of motion, manual lymph drainage to right breast and soft tissue work to right axilla and lateral  Consider compression bra and show options    Consulted and Agree with Plan of Care Patient      Patient will benefit from skilled therapeutic intervention in order to improve the following deficits and impairments:  Obesity, Increased edema, Increased fascial restricitons, Impaired UE functional use, Pain, Decreased mobility, Difficulty walking, Increased muscle spasms, Decreased range of motion, Impaired perceived functional ability, Postural dysfunction, Impaired flexibility  Visit Diagnosis: Lymphedema, not elsewhere classified  Pain in right arm  Stiffness of right shoulder, not elsewhere classified  Low back pain with left-sided sciatica, unspecified back pain laterality, unspecified chronicity  Physical deconditioning     Problem List Patient Active Problem List   Diagnosis Date Noted  . Lymphedema 09/17/2015  . Long term current use of anticoagulant therapy 08/23/2015  . DVT, lower extremity ([place]) 06/18/2015  . Bilateral knee pain 04/03/2015  . Arm edema 08/28/2014  . Vitamin D deficiency 04/23/2014  . Hyperparathyroidism ([place]) 04/23/2014  . Depression 07/18/2013  . Malignant neoplasm of upper-outer quadrant of right breast in female, estrogen receptor positive ([place]) 07/15/2013  . Encounter for therapeutic drug monitoring 07/05/2013  . Overactive bladder 01/30/2013  . Primary hypercoagulable state ([place]) 12/19/2012  . HTN (hypertension) 09/27/2011  . Hearing loss 09/27/2011  . Chest wall pain 09/27/2011  . Seasonal allergies 09/27/2011  . SOB (shortness of breath), related to deconditioning 08/26/2011  . Hypothyroid 08/26/2011   [name]. Owens Shark PT  [name] 10/04/2016, 4:31 PM  [place], [name], [address] Phone: [phone]   Fax:  [phone]  Name: Alicia Vasquez MRN: [id number] Date of Birth: 17-May-1941

## 2016-10-13 ENCOUNTER — Telehealth: Payer: Self-pay | Admitting: *Deleted

## 2016-10-13 ENCOUNTER — Ambulatory Visit: Payer: Medicare Other | Admitting: Physical Therapy

## 2016-10-13 DIAGNOSIS — M25611 Stiffness of right shoulder, not elsewhere classified: Secondary | ICD-10-CM

## 2016-10-13 DIAGNOSIS — R5381 Other malaise: Secondary | ICD-10-CM

## 2016-10-13 DIAGNOSIS — M79601 Pain in right arm: Secondary | ICD-10-CM

## 2016-10-13 DIAGNOSIS — I890 Lymphedema, not elsewhere classified: Secondary | ICD-10-CM

## 2016-10-13 DIAGNOSIS — M5442 Lumbago with sciatica, left side: Secondary | ICD-10-CM | POA: Diagnosis not present

## 2016-10-13 NOTE — Telephone Encounter (Signed)
Called patient in regards to what orthopedic she would like to see, per [name], NP.  Left a patient voice message-awaiting a call back.

## 2016-10-13 NOTE — Telephone Encounter (Signed)
-----   Message from Gardenia Phlegm, NP sent at 10/13/2016  3:53 PM EDT ----- Can you please call patient and see who she wants to see in regards to ortho?  I typically refer to [facility] ----- Message ----- From: Kipp Laurence, PT Sent: 10/13/2016   3:11 PM To: Gardenia Phlegm, NP  Hi! I am seeing [facility] and she is still having back pain. She has not yet received a call from and orthopedic doctor.  I saw a note from your nurse stating you might be able to help set this up for her.  If so, can you please contact Luzmaria and let her know how to proceed? Thank you!  Maudry Diego, PT

## 2016-10-13 NOTE — Therapy (Signed)
[place], [name], [address] Phone: [phone]   Fax:  [phone]  Physical Therapy Treatment  Patient Details  Name: Alicia Vasquez MRN: [id number] Date of Birth: [birth year] Referring Provider: Tivis Ringer   Encounter Date: 10/13/2016      PT End of Session - 10/13/16 1725    Visit Number 3  (pt late for appointment )    Number of Visits 9   Date for PT Re-Evaluation 11/18/16   PT Start Time 7893   PT Stop Time 1515   PT Time Calculation (min) 30 min   Activity Tolerance Patient tolerated treatment well   Behavior During Therapy [facility] for tasks assessed/performed      Past Medical History:  Diagnosis Date  . Allergy   . Anxiety   . Arthritis   . Blood transfusion without reported diagnosis   . Breast cancer ([place]) 07/12/13   invasive mammary carcinoma  . DVT (deep vein thrombosis) in pregnancy ([place])   . Hypertension   . Hypothyroid   . Personal history of radiation therapy   . Pneumonia   . Radiation 11/21/13-01/07/14   Right Breast/supraclav.    Past Surgical History:  Procedure Laterality Date  . biospy     female organs  . BREAST LUMPECTOMY Right 08/30/2013  . BREAST LUMPECTOMY WITH RADIOACTIVE SEED LOCALIZATION Right 09/09/2013   Procedure: BREAST LUMPECTOMY WITH RADIOACTIVE SEED LOCALIZATION WITH AXILLARY NODE EXCISION;  Surgeon: [name], MD;  Location: [place];  Service: General;  Laterality: Right;  . DILATION AND CURETTAGE OF UTERUS    . RE-EXCISION OF BREAST LUMPECTOMY Right 09/24/2013   Procedure: RE-EXCISION OF RIGHT BREAST LUMPECTOMY;  Surgeon: [name], MD;  Location: [place];  Service: General;  Laterality: Right;  . removal of teeth  04/19/2012   13 teeth removed    There were no vitals filed for this visit.      Subjective Assessment - 10/13/16 1448    Subjective Pt has had problems with  transportation today.  She has lots of problems with family members.  She still is having left leg numbness and has not gotten her orthopedic doctor appointment    Pertinent History breast cancer in 2015 with lumpectomy and 4 sentinel nodes with second surgery to get clear margins.  She underwent  radiation.  Multiple complaints of pain in back, left legs neck right  shoulder and arm. She had a near fall in December and twisted her back and caught herself with right arm and has pain since. Pt reports she has not followed up with community exercise program   Patient Stated Goals get some relief from the pain in her right axilla    Currently in Pain? Yes   Pain Score 7    Pain Location Back   Pain Orientation Left   Pain Descriptors / Indicators Aching   Pain Type Chronic pain   Pain Radiating Towards down to left ankle that is numb and achy    Pain Onset More than a month ago   Pain Frequency Constant   Aggravating Factors  walking and sitting    Pain Relieving Factors tylenol    Multiple Pain Sites Yes   Pain Location Axilla   Pain Orientation Right   Pain Descriptors / Indicators Aching   Pain Radiating Towards under right breast.     Pain Frequency Constant  [place] Adult PT Treatment/Exercise - 10/13/16 0001      Lumbar Exercises: Supine   Other Supine Lumbar Exercises assisted knee to chest with opposite leg extended for hip flexor stretch    Other Supine Lumbar Exercises attempted pelvic tilts      Manual Therapy   Manual Therapy Manual Lymphatic Drainage (MLD)   Manual Lymphatic Drainage (MLD) deep breaths, short neck, superficial shoulders, anterior and posterior interaxillary anastamosis, right axiila , lateral chest , right shoulder arm and forearm with return along pathways                         [facility] Clinic Goals - 09/29/16 1812      CC Long Term Goal  #1   Title Pt will report a 50% reduction in low back pain     Time 4   Period Weeks   Status New     CC Long Term Goal  #2   Title Pt will have/know how to get  a compression bra  that she is able to use to control her lymphedema    Time 4   Period Weeks   Status New     CC Long Term Goal  #3   Title Pt will have a decrease in Quick DASH to < 38 demonstrating functional improvment of right arm     Baseline 52.27 o 09/29/2016   Time 4   Period Weeks   Status New     CC Long Term Goal  #4   Title Pt will report a 50 % decrease in right upper quadrant pain    Time 4   Status New     CC Long Term Goal  #5   Title Pt will increase repetitions fo sit to stand in 30 seconds to 8 demonstrating and increase in generalized strength and function   Baseline 6 in 30 seconds limited by leg pain   Time 4   Period Weeks   Status New            Plan - 10/13/16 1722    Clinical Impression Statement Pt late for appointment today. She continues to have complaints of back  and leg pain and has not gotten appointment to see orthopedic surgeon, so message left for [place]  Pt had increased pain with attempted low back exercises.    Rehab Potential Good   Clinical Impairments Affecting Rehab Potential previous radiation, 4 nodes removed,  chronic pain limiting her activities    PT Frequency 2x / week   PT Duration 4 weeks   PT Treatment/Interventions Manual techniques;ADLs/Self Care Home Management;Patient/family education;Orthotic Fit/Training;Taping;Electrical Stimulation;Therapeutic activities;Therapeutic exercise;Passive range of motion;Scar mobilization;Manual lymph drainage;Compression bandaging;Moist Heat;Neuromuscular re-education   PT Next Visit Plan  moist heat to low back with core exercise and range of motion, manual lymph drainage to right breast and soft tissue work to right axilla and lateral  Consider compression bra and show options       Patient will benefit from skilled therapeutic intervention in order to improve the  following deficits and impairments:  Obesity, Increased edema, Increased fascial restricitons, Impaired UE functional use, Pain, Decreased mobility, Difficulty walking, Increased muscle spasms, Decreased range of motion, Impaired perceived functional ability, Postural dysfunction, Impaired flexibility  Visit Diagnosis: Lymphedema, not elsewhere classified  Pain in right arm  Stiffness of right shoulder, not elsewhere classified  Low back pain with left-sided sciatica, unspecified back pain laterality, unspecified chronicity  Physical deconditioning     Problem List Patient Active Problem List   Diagnosis Date Noted  . Lymphedema 09/17/2015  . Long term current use of anticoagulant therapy 08/23/2015  . DVT, lower extremity ([place]) 06/18/2015  . Bilateral knee pain 04/03/2015  . Arm edema 08/28/2014  . Vitamin D deficiency 04/23/2014  . Hyperparathyroidism ([place]) 04/23/2014  . Depression 07/18/2013  . Malignant neoplasm of upper-outer quadrant of right breast in female, estrogen receptor positive ([place]) 07/15/2013  . Encounter for therapeutic drug monitoring 07/05/2013  . Overactive bladder 01/30/2013  . Primary hypercoagulable state ([place]) 12/19/2012  . HTN (hypertension) 09/27/2011  . Hearing loss 09/27/2011  . Chest wall pain 09/27/2011  . Seasonal allergies 09/27/2011  . SOB (shortness of breath), related to deconditioning 08/26/2011  . Hypothyroid 08/26/2011   [name]. Owens Shark PT  [name] 10/13/2016, [place], [name], [address] Phone: [phone]   Fax:  [phone]  Name: Alicia Vasquez MRN: [id number] Date of Birth: Dec 12, 1940

## 2016-10-14 ENCOUNTER — Ambulatory Visit: Payer: Medicare Other | Admitting: Physical Therapy

## 2016-10-14 DIAGNOSIS — M5442 Lumbago with sciatica, left side: Secondary | ICD-10-CM

## 2016-10-14 DIAGNOSIS — R5381 Other malaise: Secondary | ICD-10-CM

## 2016-10-14 DIAGNOSIS — M25611 Stiffness of right shoulder, not elsewhere classified: Secondary | ICD-10-CM

## 2016-10-14 DIAGNOSIS — I890 Lymphedema, not elsewhere classified: Secondary | ICD-10-CM | POA: Diagnosis not present

## 2016-10-14 DIAGNOSIS — M79601 Pain in right arm: Secondary | ICD-10-CM

## 2016-10-14 NOTE — Patient Instructions (Signed)
First of all, check with your insurance company to see if provider is in [facility]                                            [address]  [place], [place] [address] [phone]    Does not file for insurance--- call for appointment with [facility]   (for wigs and compression sleeves / gloves/gauntlets )  [place], [place] [address] [phone]  Will file some insurances --- call for appointment     Second to [facility] (for mastectomy prosthetics and garments) [place], [place] [address] [phone] Will file some insurances --- call for appointment LOOK AT THESE BRANDS:  Charisse Klinefelter, [place]  GET A BRA THAT HAS [place] TO THE ARMPIT IN [place]  [address]  [place], [place] [address] [phone] Lower extremity garments  [facility] [address] [place], [place]  [address] [place] Sales rep:  Kern [place]:  [phone] [email or web address] Biocompression pumps   [employer]  Sales rep: Donneta Romberg:  [phone] [email or web address] [place] Pulley and Flexitouch pumps    Other Resources: National Lymphedema Network:  www.lymphnet.org [email or web address] for patient articles and purchase a self manual lymph drainage DVD [email or web address] has informative articles.

## 2016-10-14 NOTE — Therapy (Signed)
[place], [name], [address] Phone: [phone]   Fax:  [phone]  Physical Therapy Treatment  Patient Details  Name: Alicia Vasquez MRN: [id number] Date of Birth: [birth year] Referring Provider: Tivis Ringer   Encounter Date: 10/14/2016      PT End of Session - 10/14/16 1227    Visit Number 4   Number of Visits 9   Date for PT Re-Evaluation 11/18/16   PT Start Time 9833   PT Stop Time 1100   PT Time Calculation (min) 45 min   Activity Tolerance Patient tolerated treatment well   Behavior During Therapy [facility] for tasks assessed/performed      Past Medical History:  Diagnosis Date  . Allergy   . Anxiety   . Arthritis   . Blood transfusion without reported diagnosis   . Breast cancer ([place]) 07/12/13   invasive mammary carcinoma  . DVT (deep vein thrombosis) in pregnancy ([place])   . Hypertension   . Hypothyroid   . Personal history of radiation therapy   . Pneumonia   . Radiation 11/21/13-01/07/14   Right Breast/supraclav.    Past Surgical History:  Procedure Laterality Date  . biospy     female organs  . BREAST LUMPECTOMY Right 08/30/2013  . BREAST LUMPECTOMY WITH RADIOACTIVE SEED LOCALIZATION Right 09/09/2013   Procedure: BREAST LUMPECTOMY WITH RADIOACTIVE SEED LOCALIZATION WITH AXILLARY NODE EXCISION;  Surgeon: [name], MD;  Location: [place];  Service: General;  Laterality: Right;  . DILATION AND CURETTAGE OF UTERUS    . RE-EXCISION OF BREAST LUMPECTOMY Right 09/24/2013   Procedure: RE-EXCISION OF RIGHT BREAST LUMPECTOMY;  Surgeon: [name], MD;  Location: [place];  Service: General;  Laterality: Right;  . removal of teeth  04/19/2012   13 teeth removed    There were no vitals filed for this visit.      Subjective Assessment - 10/14/16 1017    Subjective pt states she received a call from [facility] dr office, but was  not able to pick up She will return the call today .  Her pain when she wakes up is severe, but after the tylenol and shower is better but she still has pain,  She has numbness and tingling in the left side of just above the ankle    Pertinent History breast cancer in 2015 with lumpectomy and 4 sentinel nodes with second surgery to get clear margins.  She underwent  radiation.  Multiple complaints of pain in back, left legs neck right  shoulder and arm. She had a near fall in December and twisted her back and caught herself with right arm and has pain since. Pt reports she has not followed up with community exercise program   Patient Stated Goals get some relief from the pain in her right axilla    Currently in Pain? Yes   Pain Score 7    Pain Location Back   Pain Orientation Left   Pain Descriptors / Indicators Aching   Pain Type Chronic pain   Pain Radiating Towards radiates down to the ankle    Pain Onset More than a month ago   Pain Frequency Constant   Pain Score 4  if not moving, gets to 7 or  8 when starts moving    Pain Location Axilla   Pain Orientation Right   Pain Descriptors / Indicators Aching   Pain Type Chronic pain   Pain Radiating Towards back  to back of neck    Pain Frequency Constant                         [facility] Adult PT Treatment/Exercise - 10/14/16 0001      Self-Care   Self-Care Other Self-Care Comments   Other Self-Care Comments  showed patient several options for compression bras      Knee/Hip Exercises: Standing   Other Standing Knee Exercises standing hip flexor stretch with increased pain down left leg with stretch to left hip flexor     Shoulder Exercises: Seated   Flexion AROM;Right;10 reps     Moist Heat Therapy   Number Minutes Moist Heat 20 Minutes   Moist Heat Location Lumbar Spine     Manual Therapy   Soft tissue mobilization with biotone, soft tissue work to trigger points in right upper back , interscapular area, cervical  and posterior axilla. Pt with exaggerated pain response to pressure on trigger points.                         [facility] Goals - 10/14/16 1232      CC Long Term Goal  #1   Title Pt will report a 50% reduction in low back pain    Time 4   Period Weeks   Status On-going     CC Long Term Goal  #2   Title Pt will have/know how to get  a compression bra  that she is able to use to control her lymphedema    Time 4   Period Weeks   Status On-going     CC Long Term Goal  #3   Title Pt will have a decrease in Quick DASH to < 38 demonstrating functional improvment of right arm     Baseline 52.27 o 09/29/2016   Time 4   Period Weeks   Status On-going     CC Long Term Goal  #4   Title Pt will report a 50 % decrease in right upper quadrant pain    Time 4   Period Weeks   Status On-going     CC Long Term Goal  #5   Title Pt will increase repetitions fo sit to stand in 30 seconds to 8 demonstrating and increase in generalized strength and function   Baseline 6 in 30 seconds limited by leg pain   Time 4   Period Weeks   Status On-going            Plan - 10/14/16 1228    Rehab Potential Good   Clinical Impairments Affecting Rehab Potential previous radiation, 4 nodes removed,  chronic pain limiting her activities    PT Duration 4 weeks   PT Next Visit Plan  moist heat to low back with core exercise and range of motion, manual lymph drainage to right breast and soft tissue work to right axilla and lateral     PT Plan Pt continues to have multiple complaints of pain especially in left lower leg today.  She seems to have an exaggerated response to pressure at muscular trigger points, Pt reports she got some relief from treatment but still has pain.  She will contact MD to get a referral to ortho MD hopefully soon.       Patient will benefit from skilled therapeutic intervention in order to improve the following deficits and impairments:  Obesity, Increased edema,  Increased fascial  restricitons, Impaired UE functional use, Pain, Decreased mobility, Difficulty walking, Increased muscle spasms, Decreased range of motion, Impaired perceived functional ability, Postural dysfunction, Impaired flexibility  Visit Diagnosis: Lymphedema, not elsewhere classified  Pain in right arm  Stiffness of right shoulder, not elsewhere classified  Low back pain with left-sided sciatica, unspecified back pain laterality, unspecified chronicity  Physical deconditioning     Problem List Patient Active Problem List   Diagnosis Date Noted  . Lymphedema 09/17/2015  . Long term current use of anticoagulant therapy 08/23/2015  . DVT, lower extremity ([place]) 06/18/2015  . Bilateral knee pain 04/03/2015  . Arm edema 08/28/2014  . Vitamin D deficiency 04/23/2014  . Hyperparathyroidism ([place]) 04/23/2014  . Depression 07/18/2013  . Malignant neoplasm of upper-outer quadrant of right breast in female, estrogen receptor positive ([place]) 07/15/2013  . Encounter for therapeutic drug monitoring 07/05/2013  . Overactive bladder 01/30/2013  . Primary hypercoagulable state ([place]) 12/19/2012  . HTN (hypertension) 09/27/2011  . Hearing loss 09/27/2011  . Chest wall pain 09/27/2011  . Seasonal allergies 09/27/2011  . SOB (shortness of breath), related to deconditioning 08/26/2011  . Hypothyroid 08/26/2011   [name]. Owens Shark PT  [name] 10/14/2016, 12:33 PM  [place], [name], [address] Phone: [phone]   Fax:  [phone]  Name: Alicia Vasquez MRN: [id number] Date of Birth: Jun 19, 1940

## 2016-10-17 ENCOUNTER — Telehealth: Payer: Self-pay | Admitting: *Deleted

## 2016-10-17 NOTE — Telephone Encounter (Signed)
Per [name], NP notified patient in regards to what orthopaedic she would like to be referred to. Patient stated "would like to see Alicia Lighter, MD at [facility]." Spoke with Crystal at [facility] stated " can not make an appointment due to patient outstanding balance with this office. Once patient calls and speaks to the financial department, they can transfer her to the scheduling department for an appointment."  Notified patient making her aware of the above information, patient was given [facility] Orthopaedic office number, and understanding was verbalized.

## 2016-10-18 ENCOUNTER — Ambulatory Visit: Payer: Medicare Other | Admitting: Physical Therapy

## 2016-10-18 DIAGNOSIS — M79601 Pain in right arm: Secondary | ICD-10-CM

## 2016-10-18 DIAGNOSIS — R5381 Other malaise: Secondary | ICD-10-CM

## 2016-10-18 DIAGNOSIS — M25611 Stiffness of right shoulder, not elsewhere classified: Secondary | ICD-10-CM | POA: Diagnosis not present

## 2016-10-18 DIAGNOSIS — I890 Lymphedema, not elsewhere classified: Secondary | ICD-10-CM | POA: Diagnosis not present

## 2016-10-18 DIAGNOSIS — M5442 Lumbago with sciatica, left side: Secondary | ICD-10-CM

## 2016-10-18 NOTE — Therapy (Signed)
[place], [name], [address] Phone: [phone]   Fax:  [phone]  Physical Therapy Treatment  Patient Details  Name: Alicia Vasquez MRN: [id number] Date of Birth: 1940-09-01 Referring Provider: Tivis Ringer   Encounter Date: 10/18/2016      PT End of Session - 10/18/16 1730    Visit Number 5   Number of Visits 9   Date for PT Re-Evaluation 11/18/16   PT Start Time 1430   PT Stop Time 1515   PT Time Calculation (min) 45 min   Activity Tolerance Patient tolerated treatment well   Behavior During Therapy [facility] for tasks assessed/performed      Past Medical History:  Diagnosis Date  . Allergy   . Anxiety   . Arthritis   . Blood transfusion without reported diagnosis   . Breast cancer ([place]) 07/12/13   invasive mammary carcinoma  . DVT (deep vein thrombosis) in pregnancy ([place])   . Hypertension   . Hypothyroid   . Personal history of radiation therapy   . Pneumonia   . Radiation 11/21/13-01/07/14   Right Breast/supraclav.    Past Surgical History:  Procedure Laterality Date  . biospy     female organs  . BREAST LUMPECTOMY Right 08/30/2013  . BREAST LUMPECTOMY WITH RADIOACTIVE SEED LOCALIZATION Right 09/09/2013   Procedure: BREAST LUMPECTOMY WITH RADIOACTIVE SEED LOCALIZATION WITH AXILLARY NODE EXCISION;  Surgeon: [name], MD;  Location: [place];  Service: General;  Laterality: Right;  . DILATION AND CURETTAGE OF UTERUS    . RE-EXCISION OF BREAST LUMPECTOMY Right 09/24/2013   Procedure: RE-EXCISION OF RIGHT BREAST LUMPECTOMY;  Surgeon: [name], MD;  Location: [facility];  Service: General;  Laterality: Right;  . removal of teeth  04/19/2012   13 teeth removed    There were no vitals filed for this visit.      Subjective Assessment - 10/18/16 1436    Subjective pt says she felt better after last treatment especailly at shoulder  and right upper axillary area, but she had increased in back pain and left leg pain after activities last weekend. She still has to call [place] [facility] [place]    Pertinent History breast cancer in 2015 with lumpectomy and 4 sentinel nodes with second surgery to get clear margins.  She underwent  radiation.  Multiple complaints of pain in back, left legs neck right  shoulder and arm. She had a near fall in December and twisted her back and caught herself with right arm and has pain since. Pt reports she has not followed up with community exercise program   Patient Stated Goals get some relief from the pain in her right axilla    Currently in Pain? Yes   Pain Score 6    Pain Location Back   Pain Orientation Left   Pain Descriptors / Indicators Aching   Pain Type Chronic pain   Pain Radiating Towards radiates to knee and ankle    Pain Onset More than a month ago   Pain Frequency Constant   Aggravating Factors  walking and sitting    Pain Relieving Factors tylenol    Effect of Pain on Daily Activities cannot do household chores    Pain Score 5   Pain Location Axilla   Pain Orientation Right   Pain Descriptors / Indicators Aching   Pain Type Chronic pain   Pain Radiating Towards back to back of neck    Pain  Frequency Constant                         [facility] Adult PT Treatment/Exercise - 10/18/16 0001      Moist Heat Therapy   Number Minutes Moist Heat 20 Minutes   Moist Heat Location Lumbar Spine     Manual Therapy   Manual Therapy Soft tissue mobilization   Soft tissue mobilization with biotone, soft tissue work to trigger points in right upper back , interscapular area, cervical and posterior axilla. . Also performed soft tissue work on low back                         [facility] Goals - 10/14/16 1232      CC Long Term Goal  #1   Title Pt will report a 50% reduction in low back pain    Time 4   Period Weeks   Status On-going     CC Long  Term Goal  #2   Title Pt will have/know how to get  a compression bra  that she is able to use to control her lymphedema    Time 4   Period Weeks   Status On-going     CC Long Term Goal  #3   Title Pt will have a decrease in Quick DASH to < 38 demonstrating functional improvment of right arm     Baseline 52.27 o 09/29/2016   Time 4   Period Weeks   Status On-going     CC Long Term Goal  #4   Title Pt will report a 50 % decrease in right upper quadrant pain    Time 4   Period Weeks   Status On-going     CC Long Term Goal  #5   Title Pt will increase repetitions fo sit to stand in 30 seconds to 8 demonstrating and increase in generalized strength and function   Baseline 6 in 30 seconds limited by leg pain   Time 4   Period Weeks   Status On-going            Plan - 10/18/16 1731    Clinical Impression Statement t report her right upper quarter is feeling better with the treatment but she continues to complain of back and leg pain and has to walk with a cane.  Encouraged pt to contact orthopedic doctor for appointment    Rehab Potential Good   Clinical Impairments Affecting Rehab Potential previous radiation, 4 nodes removed,  chronic pain limiting her activities    PT Frequency 2x / week   PT Duration 4 weeks   PT Treatment/Interventions Manual techniques;ADLs/Self Care Home Management;Patient/family education;Orthotic Fit/Training;Taping;Electrical Stimulation;Therapeutic activities;Therapeutic exercise;Passive range of motion;Scar mobilization;Manual lymph drainage;Compression bandaging;Moist Heat;Neuromuscular re-education   PT Next Visit Plan  moist heat and soft tissue work to low back with core exercise and range of motion, manual lymph drainage to right breast and soft tissue work to right axilla and lateral chest,    Consulted and Agree with Plan of Care Patient      Patient will benefit from skilled therapeutic intervention in order to improve the following deficits  and impairments:  Obesity, Increased edema, Increased fascial restricitons, Impaired UE functional use, Pain, Decreased mobility, Difficulty walking, Increased muscle spasms, Decreased range of motion, Impaired perceived functional ability, Postural dysfunction, Impaired flexibility  Visit Diagnosis: Lymphedema, not elsewhere classified  Pain in right arm  Low  back pain with left-sided sciatica, unspecified back pain laterality, unspecified chronicity  Physical deconditioning  Stiffness of right shoulder, not elsewhere classified     Problem List Patient Active Problem List   Diagnosis Date Noted  . Lymphedema 09/17/2015  . Long term current use of anticoagulant therapy 08/23/2015  . DVT, lower extremity ([place]) 06/18/2015  . Bilateral knee pain 04/03/2015  . Arm edema 08/28/2014  . Vitamin D deficiency 04/23/2014  . Hyperparathyroidism ([place]) 04/23/2014  . Depression 07/18/2013  . Malignant neoplasm of upper-outer quadrant of right breast in female, estrogen receptor positive ([place]) 07/15/2013  . Encounter for therapeutic drug monitoring 07/05/2013  . Overactive bladder 01/30/2013  . Primary hypercoagulable state ([place]) 12/19/2012  . HTN (hypertension) 09/27/2011  . Hearing loss 09/27/2011  . Chest wall pain 09/27/2011  . Seasonal allergies 09/27/2011  . SOB (shortness of breath), related to deconditioning 08/26/2011  . Hypothyroid 08/26/2011   [name]. Owens Shark PT  [name] 10/18/2016, 5:33 PM  [place], [name], [address] Phone: [phone]   Fax:  [phone]  Name: Alicia Vasquez MRN: [id number] Date of Birth: Feb 13, 1941

## 2016-10-20 ENCOUNTER — Ambulatory Visit: Payer: Medicare Other | Admitting: Physical Therapy

## 2016-10-20 ENCOUNTER — Other Ambulatory Visit: Payer: Medicare Other

## 2016-10-20 DIAGNOSIS — C50911 Malignant neoplasm of unspecified site of right female breast: Secondary | ICD-10-CM | POA: Diagnosis not present

## 2016-10-20 DIAGNOSIS — M25611 Stiffness of right shoulder, not elsewhere classified: Secondary | ICD-10-CM

## 2016-10-20 DIAGNOSIS — M79601 Pain in right arm: Secondary | ICD-10-CM | POA: Diagnosis not present

## 2016-10-20 DIAGNOSIS — R5381 Other malaise: Secondary | ICD-10-CM

## 2016-10-20 DIAGNOSIS — M5442 Lumbago with sciatica, left side: Secondary | ICD-10-CM

## 2016-10-20 DIAGNOSIS — I890 Lymphedema, not elsewhere classified: Secondary | ICD-10-CM

## 2016-10-20 NOTE — Therapy (Signed)
[place], [name], [address] Phone: [phone]   Fax:  [phone]  Physical Therapy Treatment  Patient Details  Name: Alicia Vasquez MRN: [id number] Date of Birth: Mar 09, 1941 Referring Provider: Tivis Ringer   Encounter Date: 10/20/2016      PT End of Session - 10/20/16 1728    Visit Number 6   Number of Visits 9   Date for PT Re-Evaluation 11/18/16   PT Start Time 1600   PT Stop Time 1645   PT Time Calculation (min) 45 min   Activity Tolerance Patient tolerated treatment well   Behavior During Therapy [facility] for tasks assessed/performed      Past Medical History:  Diagnosis Date  . Allergy   . Anxiety   . Arthritis   . Blood transfusion without reported diagnosis   . Breast cancer ([place]) 07/12/13   invasive mammary carcinoma  . DVT (deep vein thrombosis) in pregnancy ([place])   . Hypertension   . Hypothyroid   . Personal history of radiation therapy   . Pneumonia   . Radiation 11/21/13-01/07/14   Right Breast/supraclav.    Past Surgical History:  Procedure Laterality Date  . biospy     female organs  . BREAST LUMPECTOMY Right 08/30/2013  . BREAST LUMPECTOMY WITH RADIOACTIVE SEED LOCALIZATION Right 09/09/2013   Procedure: BREAST LUMPECTOMY WITH RADIOACTIVE SEED LOCALIZATION WITH AXILLARY NODE EXCISION;  Surgeon: [name], MD;  Location: [place];  Service: General;  Laterality: Right;  . DILATION AND CURETTAGE OF UTERUS    . RE-EXCISION OF BREAST LUMPECTOMY Right 09/24/2013   Procedure: RE-EXCISION OF RIGHT BREAST LUMPECTOMY;  Surgeon: [name], MD;  Location: [place];  Service: General;  Laterality: Right;  . removal of teeth  04/19/2012   13 teeth removed    There were no vitals filed for this visit.      Subjective Assessment - 10/20/16 1604    Subjective Pt states that she continues to have pain in her left hip and and  down her left leg . She says she tried to contact the Finance department at [facility], but she didn't get to talk to anyone.  She may have called at the wrong time.  So she does not have an appointment with an orthopeic doctor.  She say Dr. Magnus Sinning today and he said she is doing fine. She is also concerned about her tick bite. She can't remember when she will go to get the compression bra but it may be this Friday.     Pertinent History breast cancer in 2015 with lumpectomy and 4 sentinel nodes with second surgery to get clear margins.  She underwent  radiation.  Multiple complaints of pain in back, left legs neck right  shoulder and arm. She had a near fall in December and twisted her back and caught herself with right arm and has pain since. Pt reports she has not followed up with community exercise program   Patient Stated Goals get some relief from the pain in her right axilla    Currently in Pain? Yes   Pain Score 5    Pain Location Back   Pain Orientation Left   Pain Descriptors / Indicators Aching   Pain Radiating Towards radiates to foot.    Pain Onset More than a month ago   Pain Frequency Constant   Aggravating Factors  walk    Pain Score 5   Pain Location Axilla  Pain Orientation Right   Pain Descriptors / Indicators Aching   Pain Type Chronic pain   Pain Radiating Towards under breast             [facility] PT Assessment - 10/20/16 0001      Observation/Other Assessments   Observations pt with fresh cat scratches at front and back of right chest. She has raised area that she said was the site of the tick bite that gets irritatied by her pants waistband                      [facility] Adult PT Treatment/Exercise - 10/20/16 0001      Self-Care   Self-Care Other Self-Care Comments   Other Self-Care Comments  contacted [facility] and got pt an appointment for a compression bra tomrrow at 1:00     Moist Heat Therapy   Number Minutes Moist Heat 20  Minutes   Moist Heat Location Lumbar Spine     Manual Therapy   Manual Therapy Soft tissue mobilization   Soft tissue mobilization with biotone, soft tissue work to trigger points in right upper back , interscapular area, cervical and posterior axilla. . Also performed soft tissue work on low back                         [facility] Goals - 10/14/16 1232      CC Long Term Goal  #1   Title Pt will report a 50% reduction in low back pain    Time 4   Period Weeks   Status On-going     CC Long Term Goal  #2   Title Pt will have/know how to get  a compression bra  that she is able to use to control her lymphedema    Time 4   Period Weeks   Status On-going     CC Long Term Goal  #3   Title Pt will have a decrease in Quick DASH to < 38 demonstrating functional improvment of right arm     Baseline 52.27 o 09/29/2016   Time 4   Period Weeks   Status On-going     CC Long Term Goal  #4   Title Pt will report a 50 % decrease in right upper quadrant pain    Time 4   Period Weeks   Status On-going     CC Long Term Goal  #5   Title Pt will increase repetitions fo sit to stand in 30 seconds to 8 demonstrating and increase in generalized strength and function   Baseline 6 in 30 seconds limited by leg pain   Time 4   Period Weeks   Status On-going            Plan - 10/20/16 1728    Clinical Impression Statement Pt has new cat scratches on anterior chest and posterior upper back.  She continues to have an antalgic gait and complain of foot and ankle numbness and pain. She says she got some relief with treament today and had relief from foot pain and and back pain decreased to a 4 or 5    Rehab Potential Good   Clinical Impairments Affecting Rehab Potential previous radiation, 4 nodes removed,  chronic pain limiting her activities    PT Frequency 2x / week   PT Duration 4 weeks   PT Treatment/Interventions Manual techniques;ADLs/Self Care Home  Management;Patient/family education;Orthotic Fit/Training;Taping;Electrical  Stimulation;Therapeutic activities;Therapeutic exercise;Passive range of motion;Scar mobilization;Manual lymph drainage;Compression bandaging;Moist Heat;Neuromuscular re-education   PT Next Visit Plan  moist heat and soft tissue work to low back with core exercise and range of motion, manual lymph drainage to right breast and soft tissue work to right axilla and lateral chest,    Consulted and Agree with Plan of Care Patient   PT Plan Pt has new cat scratches on anterior chest and posterior upper back.  She continues to have an antalgic gait and complain of foot and ankle numbness and pain. She says she got some relief with treament today and had relief from foot pain and and back pain decreased to a 4 or 5       Patient will benefit from skilled therapeutic intervention in order to improve the following deficits and impairments:  Obesity, Increased edema, Increased fascial restricitons, Impaired UE functional use, Pain, Decreased mobility, Difficulty walking, Increased muscle spasms, Decreased range of motion, Impaired perceived functional ability, Postural dysfunction, Impaired flexibility  Visit Diagnosis: Lymphedema, not elsewhere classified  Pain in right arm  Low back pain with left-sided sciatica, unspecified back pain laterality, unspecified chronicity  Physical deconditioning  Stiffness of right shoulder, not elsewhere classified     Problem List Patient Active Problem List   Diagnosis Date Noted  . Lymphedema 09/17/2015  . Long term current use of anticoagulant therapy 08/23/2015  . DVT, lower extremity ([place]) 06/18/2015  . Bilateral knee pain 04/03/2015  . Arm edema 08/28/2014  . Vitamin D deficiency 04/23/2014  . Hyperparathyroidism ([place]) 04/23/2014  . Depression 07/18/2013  . Malignant neoplasm of upper-outer quadrant of right breast in female, estrogen receptor positive ([place]) 07/15/2013  .  Encounter for therapeutic drug monitoring 07/05/2013  . Overactive bladder 01/30/2013  . Primary hypercoagulable state ([place]) 12/19/2012  . HTN (hypertension) 09/27/2011  . Hearing loss 09/27/2011  . Chest wall pain 09/27/2011  . Seasonal allergies 09/27/2011  . SOB (shortness of breath), related to deconditioning 08/26/2011  . Hypothyroid 08/26/2011   [name]. Owens Shark PT  [name] 10/20/2016, 5:34 PM  [place], [name], [address] Phone: [phone]   Fax:  [phone]  Name: Alicia Vasquez MRN: [id number] Date of Birth: 1941/03/15

## 2016-10-21 ENCOUNTER — Other Ambulatory Visit (HOSPITAL_BASED_OUTPATIENT_CLINIC_OR_DEPARTMENT_OTHER): Payer: Medicare Other

## 2016-10-21 DIAGNOSIS — I82403 Acute embolism and thrombosis of unspecified deep veins of lower extremity, bilateral: Secondary | ICD-10-CM | POA: Diagnosis not present

## 2016-10-21 DIAGNOSIS — M8580 Other specified disorders of bone density and structure, unspecified site: Secondary | ICD-10-CM

## 2016-10-21 DIAGNOSIS — Z170 Estrogen receptor positive status [ER+]: Principal | ICD-10-CM

## 2016-10-21 DIAGNOSIS — C50411 Malignant neoplasm of upper-outer quadrant of right female breast: Secondary | ICD-10-CM

## 2016-10-21 LAB — CBC WITH DIFFERENTIAL/PLATELET
BASO%: 1 % (ref 0.0–2.0)
Basophils Absolute: 0.1 10*3/uL (ref 0.0–0.1)
EOS ABS: 0.1 10*3/uL (ref 0.0–0.5)
EOS%: 2.4 % (ref 0.0–7.0)
HCT: 42.3 % (ref 34.8–46.6)
HEMOGLOBIN: 14.1 g/dL (ref 11.6–15.9)
LYMPH#: 1.5 10*3/uL (ref 0.9–3.3)
LYMPH%: 25.8 % (ref 14.0–49.7)
MCH: 31 pg (ref 25.1–34.0)
MCHC: 33.3 g/dL (ref 31.5–36.0)
MCV: 93 fL (ref 79.5–101.0)
MONO#: 0.8 10*3/uL (ref 0.1–0.9)
MONO%: 14.6 % — ABNORMAL HIGH (ref 0.0–14.0)
NEUT%: 56.2 % (ref 38.4–76.8)
NEUTROS ABS: 3.2 10*3/uL (ref 1.5–6.5)
NRBC: 0 % (ref 0–0)
PLATELETS: 195 10*3/uL (ref 145–400)
RBC: 4.55 10*6/uL (ref 3.70–5.45)
RDW: 14.2 % (ref 11.2–14.5)
WBC: 5.7 10*3/uL (ref 3.9–10.3)

## 2016-10-21 LAB — PROTIME-INR
INR: 2.7 (ref 2.00–3.50)
Protime: 32.4 Seconds — ABNORMAL HIGH (ref 10.6–13.4)

## 2016-10-25 ENCOUNTER — Ambulatory Visit: Payer: Medicare Other | Admitting: Physical Therapy

## 2016-10-25 DIAGNOSIS — R5381 Other malaise: Secondary | ICD-10-CM

## 2016-10-25 DIAGNOSIS — M25611 Stiffness of right shoulder, not elsewhere classified: Secondary | ICD-10-CM | POA: Diagnosis not present

## 2016-10-25 DIAGNOSIS — M5442 Lumbago with sciatica, left side: Secondary | ICD-10-CM

## 2016-10-25 DIAGNOSIS — I890 Lymphedema, not elsewhere classified: Secondary | ICD-10-CM | POA: Diagnosis not present

## 2016-10-25 DIAGNOSIS — M79601 Pain in right arm: Secondary | ICD-10-CM

## 2016-10-25 NOTE — Therapy (Signed)
[place], [name], [address] Phone: [phone]   Fax:  [phone]  Physical Therapy Treatment  Patient Details  Name: Alicia Vasquez MRN: [id number] Date of Birth: [birth year] Referring Provider: Tivis Ringer   Encounter Date: 10/25/2016      PT End of Session - 10/25/16 1702    Visit Number 7   Number of Visits 9   Date for PT Re-Evaluation 11/18/16   PT Start Time 1430   PT Stop Time 1513   PT Time Calculation (min) 43 min   Activity Tolerance Patient tolerated treatment well   Behavior During Therapy [facility] for tasks assessed/performed      Past Medical History:  Diagnosis Date  . Allergy   . Anxiety   . Arthritis   . Blood transfusion without reported diagnosis   . Breast cancer ([place]) 07/12/13   invasive mammary carcinoma  . DVT (deep vein thrombosis) in pregnancy ([place])   . Hypertension   . Hypothyroid   . Personal history of radiation therapy   . Pneumonia   . Radiation 11/21/13-01/07/14   Right Breast/supraclav.    Past Surgical History:  Procedure Laterality Date  . biospy     female organs  . BREAST LUMPECTOMY Right 08/30/2013  . BREAST LUMPECTOMY WITH RADIOACTIVE SEED LOCALIZATION Right 09/09/2013   Procedure: BREAST LUMPECTOMY WITH RADIOACTIVE SEED LOCALIZATION WITH AXILLARY NODE EXCISION;  Surgeon: [name], MD;  Location: [facility];  Service: General;  Laterality: Right;  . DILATION AND CURETTAGE OF UTERUS    . RE-EXCISION OF BREAST LUMPECTOMY Right 09/24/2013   Procedure: RE-EXCISION OF RIGHT BREAST LUMPECTOMY;  Surgeon: [name], MD;  Location: [employer];  Service: General;  Laterality: Right;  . removal of teeth  04/19/2012   13 teeth removed    There were no vitals filed for this visit.      Subjective Assessment - 10/25/16 1443    Subjective Pt says that she "missed the step" today on the way overy here  today and now she has increased pain in her left hip.  She can barely walk on her way into our clinic.  She comes in with her compression bra that seems to fit well.  It rolls a bit under her breast, but she can reposition it.  Pt feels that the compression bra helps her pain in her breast and side    Pertinent History breast cancer in 2015 with lumpectomy and 4 sentinel nodes with second surgery to get clear margins.  She underwent  radiation.  Multiple complaints of pain in back, left legs neck right  shoulder and arm. She had a near fall in December and twisted her back and caught herself with right arm and has pain since. Pt reports she has not followed up with community exercise program   Patient Stated Goals get some relief from the pain in her right axilla    Currently in Pain? Yes   Pain Score 10-Worst pain ever   Pain Location Groin   Pain Orientation Left   Pain Descriptors / Indicators Sharp;Stabbing   Pain Radiating Towards radiates to ankle    Pain Onset More than a month ago   Pain Frequency Constant   Aggravating Factors  walking    Pain Relieving Factors tylenol and heat pack help    Effect of Pain on Daily Activities pt is having difficulty bearing weight on her leg especailly when she first stands  up after sitting for a while                          [facility] Adult PT Treatment/Exercise - 10/25/16 0001      Self-Care   Self-Care Other Self-Care Comments   Other Self-Care Comments  assessed compression bra.  Pt was able to fasten it herself and says she will wear it to help control her symptoms.      Moist Heat Therapy   Number Minutes Moist Heat 20 Minutes   Moist Heat Location Lumbar Spine     Manual Therapy   Manual Therapy Soft tissue mobilization   Soft tissue mobilization with biotone, to low back                         [facility] Goals - 10/14/16 1232      CC Long Term Goal  #1   Title Pt will report a 50% reduction in  low back pain    Time 4   Period Weeks   Status On-going     CC Long Term Goal  #2   Title Pt will have/know how to get  a compression bra  that she is able to use to control her lymphedema    Time 4   Period Weeks   Status On-going     CC Long Term Goal  #3   Title Pt will have a decrease in Quick DASH to < 38 demonstrating functional improvment of right arm     Baseline 52.27 o 09/29/2016   Time 4   Period Weeks   Status On-going     CC Long Term Goal  #4   Title Pt will report a 50 % decrease in right upper quadrant pain    Time 4   Period Weeks   Status On-going     CC Long Term Goal  #5   Title Pt will increase repetitions fo sit to stand in 30 seconds to 8 demonstrating and increase in generalized strength and function   Baseline 6 in 30 seconds limited by leg pain   Time 4   Period Weeks   Status On-going            Plan - 10/25/16 1703    PT Next Visit Plan  moist heat and soft tissue work to low back with core exercise and range of motion, manual lymph drainage to right breast and soft tissue work to right axilla and lateral chest, if needed    Consulted and Agree with Plan of Care Patient   PT Plan Pt with another cat scratch on right forearm that appears to have drawn blood.  Pt encouraged to avoid interacting with cat.  Pt states she has tried to call [facility], but there is a problems with an unpaid bill of $40 from 2010 or 2011 that she was unable to resolve through the collection agency.  Pt says Dr. Joseph Art had referred her to [facility] in April, but she did not call. Encouraged pt to call and follow up on that referral.  Hopeful pt will have symptoms of right UE/breast managed with compression bra and will get follow up with orthopedic dr. for back and leg pain.  She receives only temporary relief from PT      Patient will benefit from skilled therapeutic intervention in order to improve the following deficits and impairments:  Obesity,  Increased edema, Increased fascial restricitons, Impaired UE functional use, Pain, Decreased mobility, Difficulty walking, Increased muscle spasms, Decreased range of motion, Impaired perceived functional ability, Postural dysfunction, Impaired flexibility  Visit Diagnosis: Lymphedema, not elsewhere classified  Low back pain with left-sided sciatica, unspecified back pain laterality, unspecified chronicity  Physical deconditioning  Stiffness of right shoulder, not elsewhere classified  Pain in right arm     Problem List Patient Active Problem List   Diagnosis Date Noted  . Lymphedema 09/17/2015  . Long term current use of anticoagulant therapy 08/23/2015  . DVT, lower extremity ([place]) 06/18/2015  . Bilateral knee pain 04/03/2015  . Arm edema 08/28/2014  . Vitamin D deficiency 04/23/2014  . Hyperparathyroidism ([place]) 04/23/2014  . Depression 07/18/2013  . Malignant neoplasm of upper-outer quadrant of right breast in female, estrogen receptor positive ([place]) 07/15/2013  . Encounter for therapeutic drug monitoring 07/05/2013  . Overactive bladder 01/30/2013  . Primary hypercoagulable state ([place]) 12/19/2012  . HTN (hypertension) 09/27/2011  . Hearing loss 09/27/2011  . Chest wall pain 09/27/2011  . Seasonal allergies 09/27/2011  . SOB (shortness of breath), related to deconditioning 08/26/2011  . Hypothyroid 08/26/2011   [name]. Owens Shark PT  [name] 10/25/2016, 5:09 PM  [place], [name], [address] Phone: [phone]   Fax:  [phone]  Name: MARICELA SCHREUR MRN: [id number] Date of Birth: Nov 28, 1940

## 2016-10-27 ENCOUNTER — Ambulatory Visit: Payer: Medicare Other | Admitting: Physical Therapy

## 2016-10-27 DIAGNOSIS — M5442 Lumbago with sciatica, left side: Secondary | ICD-10-CM | POA: Diagnosis not present

## 2016-10-27 DIAGNOSIS — R5381 Other malaise: Secondary | ICD-10-CM | POA: Diagnosis not present

## 2016-10-27 DIAGNOSIS — M79601 Pain in right arm: Secondary | ICD-10-CM | POA: Diagnosis not present

## 2016-10-27 DIAGNOSIS — M25611 Stiffness of right shoulder, not elsewhere classified: Secondary | ICD-10-CM

## 2016-10-27 DIAGNOSIS — I890 Lymphedema, not elsewhere classified: Secondary | ICD-10-CM | POA: Diagnosis not present

## 2016-10-27 NOTE — Therapy (Signed)
[place], [name], [address] Phone: [phone]   Fax:  [phone]  Physical Therapy Treatment  Patient Details  Name: Alicia Vasquez MRN: [id number] Date of Birth: 1940-06-22 Referring Provider: Tivis Ringer   Encounter Date: 10/27/2016      PT End of Session - 10/27/16 1652    Visit Number 8   Number of Visits 9   Date for PT Re-Evaluation 11/18/16   PT Start Time 1450  pt arrived late    PT Stop Time 1520   PT Time Calculation (min) 30 min   Activity Tolerance Patient tolerated treatment well   Behavior During Therapy [facility] for tasks assessed/performed      Past Medical History:  Diagnosis Date  . Allergy   . Anxiety   . Arthritis   . Blood transfusion without reported diagnosis   . Breast cancer ([place]) 07/12/13   invasive mammary carcinoma  . DVT (deep vein thrombosis) in pregnancy ([place])   . Hypertension   . Hypothyroid   . Personal history of radiation therapy   . Pneumonia   . Radiation 11/21/13-01/07/14   Right Breast/supraclav.    Past Surgical History:  Procedure Laterality Date  . biospy     female organs  . BREAST LUMPECTOMY Right 08/30/2013  . BREAST LUMPECTOMY WITH RADIOACTIVE SEED LOCALIZATION Right 09/09/2013   Procedure: BREAST LUMPECTOMY WITH RADIOACTIVE SEED LOCALIZATION WITH AXILLARY NODE EXCISION;  Surgeon: [name], MD;  Location: [place] [facility];  Service: General;  Laterality: Right;  . DILATION AND CURETTAGE OF UTERUS    . RE-EXCISION OF BREAST LUMPECTOMY Right 09/24/2013   Procedure: RE-EXCISION OF RIGHT BREAST LUMPECTOMY;  Surgeon: [name], MD;  Location: [facility];  Service: General;  Laterality: Right;  . removal of teeth  04/19/2012   13 teeth removed    There were no vitals filed for this visit.      Subjective Assessment - 10/27/16 1456    Subjective Pt states she called [facility] and they said  she had to have a referral from a doctor and she was not in their system . She said she is considering going to [facility] office to settle her outstanding bill so that she can see a doctor there.  She says she is getting temporary relief from pain in her back and foot after treatment but that it returns when she walks alot    Pertinent History breast cancer in 2015 with lumpectomy and 4 sentinel nodes with second surgery to get clear margins.  She underwent  radiation.  Multiple complaints of pain in back, left legs neck right  shoulder and arm. She had a near fall in December and twisted her back and caught herself with right arm and has pain since. Pt reports she has not followed up with community exercise program   Patient Stated Goals get some relief from the pain in her right axilla    Currently in Pain? Yes   Pain Score 10-Worst pain ever  when she moves the wrong was and she almost falls.    Pain Location Groin   Pain Orientation Left   Pain Descriptors / Indicators Sharp;Stabbing   Pain Type Chronic pain   Pain Radiating Towards radiates to ankle    Pain Onset More than a month ago   Pain Frequency Constant   Aggravating Factors  walking    Pain Relieving Factors tylenol and heat pack  [facility] Adult PT Treatment/Exercise - 10/27/16 0001      Moist Heat Therapy   Number Minutes Moist Heat 20 Minutes   Moist Heat Location Lumbar Spine     Manual Therapy   Manual Therapy Soft tissue mobilization   Soft tissue mobilization with biotone, to low back  and right upper back                         [facility] Clinic Goals - 10/14/16 1232      CC Long Term Goal  #1   Title Pt will report a 50% reduction in low back pain    Time 4   Period Weeks   Status On-going     CC Long Term Goal  #2   Title Pt will have/know how to get  a compression bra  that she is able to use to control her lymphedema    Time 4   Period  Weeks   Status On-going     CC Long Term Goal  #3   Title Pt will have a decrease in Quick DASH to < 38 demonstrating functional improvment of right arm     Baseline 52.27 o 09/29/2016   Time 4   Period Weeks   Status On-going     CC Long Term Goal  #4   Title Pt will report a 50 % decrease in right upper quadrant pain    Time 4   Period Weeks   Status On-going     CC Long Term Goal  #5   Title Pt will increase repetitions fo sit to stand in 30 seconds to 8 demonstrating and increase in generalized strength and function   Baseline 6 in 30 seconds limited by leg pain   Time 4   Period Weeks   Status On-going            Plan - 10/27/16 1654    Clinical Impression Statement Pt is receiving temporary relief from moist heat and soft tissue work  Feel that she would benefit from orthopedic consult for more lasting treatment.   Rehab Potential Good   Clinical Impairments Affecting Rehab Potential previous radiation, 4 nodes removed,  chronic pain limiting her activities    PT Next Visit Plan  moist heat and soft tissue work to low back with core exercise and range of motion, manual lymph drainage to right breast and soft tissue work to right axilla and lateral chest, if needed       Patient will benefit from skilled therapeutic intervention in order to improve the following deficits and impairments:  Obesity, Increased edema, Increased fascial restricitons, Impaired UE functional use, Pain, Decreased mobility, Difficulty walking, Increased muscle spasms, Decreased range of motion, Impaired perceived functional ability, Postural dysfunction, Impaired flexibility  Visit Diagnosis: Lymphedema, not elsewhere classified  Low back pain with left-sided sciatica, unspecified back pain laterality, unspecified chronicity  Physical deconditioning  Stiffness of right shoulder, not elsewhere classified  Pain in right arm     Problem List Patient Active Problem List   Diagnosis Date  Noted  . Lymphedema 09/17/2015  . Long term current use of anticoagulant therapy 08/23/2015  . DVT, lower extremity ([place]) 06/18/2015  . Bilateral knee pain 04/03/2015  . Arm edema 08/28/2014  . Vitamin D deficiency 04/23/2014  . Hyperparathyroidism ([place]) 04/23/2014  . Depression 07/18/2013  . Malignant neoplasm of upper-outer quadrant of right breast in female, estrogen receptor positive ([place]) 07/15/2013  .  Encounter for therapeutic drug monitoring 07/05/2013  . Overactive bladder 01/30/2013  . Primary hypercoagulable state ([place]) 12/19/2012  . HTN (hypertension) 09/27/2011  . Hearing loss 09/27/2011  . Chest wall pain 09/27/2011  . Seasonal allergies 09/27/2011  . SOB (shortness of breath), related to deconditioning 08/26/2011  . Hypothyroid 08/26/2011   [name]. Owens Shark PT  [name] 10/27/2016, 4:56 PM  [place], [name], [address] Phone: [phone]   Fax:  [phone]  Name: MALI EPPARD MRN: [id number] Date of Birth: 1940/12/07

## 2016-11-04 DIAGNOSIS — M545 Low back pain: Secondary | ICD-10-CM | POA: Diagnosis not present

## 2016-11-08 ENCOUNTER — Other Ambulatory Visit: Payer: Self-pay | Admitting: Oncology

## 2016-11-08 DIAGNOSIS — I82409 Acute embolism and thrombosis of unspecified deep veins of unspecified lower extremity: Secondary | ICD-10-CM

## 2016-11-08 DIAGNOSIS — Z86711 Personal history of pulmonary embolism: Secondary | ICD-10-CM

## 2016-11-10 ENCOUNTER — Other Ambulatory Visit: Payer: Self-pay | Admitting: Oncology

## 2016-11-10 ENCOUNTER — Ambulatory Visit: Payer: Medicare Other | Attending: Adult Health | Admitting: Physical Therapy

## 2016-11-10 DIAGNOSIS — I890 Lymphedema, not elsewhere classified: Secondary | ICD-10-CM | POA: Diagnosis not present

## 2016-11-10 DIAGNOSIS — M25611 Stiffness of right shoulder, not elsewhere classified: Secondary | ICD-10-CM | POA: Insufficient documentation

## 2016-11-10 DIAGNOSIS — Z9181 History of falling: Secondary | ICD-10-CM | POA: Insufficient documentation

## 2016-11-10 DIAGNOSIS — R5381 Other malaise: Secondary | ICD-10-CM

## 2016-11-10 DIAGNOSIS — M79601 Pain in right arm: Secondary | ICD-10-CM | POA: Diagnosis not present

## 2016-11-10 DIAGNOSIS — M5442 Lumbago with sciatica, left side: Secondary | ICD-10-CM

## 2016-11-10 DIAGNOSIS — Z86711 Personal history of pulmonary embolism: Secondary | ICD-10-CM

## 2016-11-10 DIAGNOSIS — I82409 Acute embolism and thrombosis of unspecified deep veins of unspecified lower extremity: Secondary | ICD-10-CM

## 2016-11-10 NOTE — Therapy (Signed)
[place], [name], [address] Phone: [phone]   Fax:  [phone]  Physical Therapy Treatment  Patient Details  Name: Alicia Vasquez MRN: [id number] Date of Birth: 1940-10-17 Referring Provider: Tivis Ringer   Encounter Date: 11/10/2016      PT End of Session - 11/10/16 1718    Visit Number 9   Number of Visits 9   Date for PT Re-Evaluation 11/18/16   PT Start Time 3382  pt arrived late   PT Stop Time 1515   PT Time Calculation (min) 30 min   Activity Tolerance Patient limited by pain   Behavior During Therapy [facility] for tasks assessed/performed      Past Medical History:  Diagnosis Date  . Allergy   . Anxiety   . Arthritis   . Blood transfusion without reported diagnosis   . Breast cancer ([place]) 07/12/13   invasive mammary carcinoma  . DVT (deep vein thrombosis) in pregnancy ([place])   . Hypertension   . Hypothyroid   . Personal history of radiation therapy   . Pneumonia   . Radiation 11/21/13-01/07/14   Right Breast/supraclav.    Past Surgical History:  Procedure Laterality Date  . biospy     female organs  . BREAST LUMPECTOMY Right 08/30/2013  . BREAST LUMPECTOMY WITH RADIOACTIVE SEED LOCALIZATION Right 09/09/2013   Procedure: BREAST LUMPECTOMY WITH RADIOACTIVE SEED LOCALIZATION WITH AXILLARY NODE EXCISION;  Surgeon: [name], MD;  Location: [facility];  Service: General;  Laterality: Right;  . DILATION AND CURETTAGE OF UTERUS    . RE-EXCISION OF BREAST LUMPECTOMY Right 09/24/2013   Procedure: RE-EXCISION OF RIGHT BREAST LUMPECTOMY;  Surgeon: [name], MD;  Location: [facility];  Service: General;  Laterality: Right;  . removal of teeth  04/19/2012   13 teeth removed    There were no vitals filed for this visit.      Subjective Assessment - 11/10/16 1449    Subjective Pt went to see Dr. Normajean Glasgow at [facility].  She  is not able to get an injection because she is on coumadin ( so she cannot get an MRI)  and is allergic to cortisone.  The doctor referred her to get acupuncture but patient is not sure she will do that She did not get an xray of her foot.   She says she has pain in her groin and foot today and comes in using a walker  She says she is willing to go to the pain managment clinic    Pertinent History breast cancer in 2015 with lumpectomy and 4 sentinel nodes with second surgery to get clear margins.  She underwent  radiation.  Multiple complaints of pain in back, left legs neck right  shoulder and arm. She had a near fall in December and twisted her back and caught herself with right arm and has pain since. Pt reports she has not followed up with community exercise program   Patient Stated Goals get some relief from the pain in her right axilla    Currently in Pain? Yes   Pain Score 10-Worst pain ever   Pain Location Groin   Pain Orientation Left   Pain Descriptors / Indicators Sharp;Stabbing   Pain Type Chronic pain   Pain Radiating Towards radiates to ankle    Pain Onset More than a month ago   Pain Frequency Constant   Aggravating Factors  walking    Pain Relieving  Factors tylenol and heat pack    Effect of Pain on Daily Activities pt is having diffiuclty bearing weight on her leg                          [facility] Adult PT Treatment/Exercise - 11/10/16 0001      Moist Heat Therapy   Number Minutes Moist Heat 20 Minutes   Moist Heat Location Lumbar Spine     Manual Therapy   Manual Therapy Soft tissue mobilization   Soft tissue mobilization with biotone, to low back                          Long Term Clinic Goals - 10/14/16 1232      CC Long Term Goal  #1   Title Pt will report a 50% reduction in low back pain    Time 4   Period Weeks   Status On-going     CC Long Term Goal  #2   Title Pt will have/know how to get  a compression bra  that she is able  to use to control her lymphedema    Time 4   Period Weeks   Status On-going     CC Long Term Goal  #3   Title Pt will have a decrease in Quick DASH to < 38 demonstrating functional improvment of right arm     Baseline 52.27 o 09/29/2016   Time 4   Period Weeks   Status On-going     CC Long Term Goal  #4   Title Pt will report a 50 % decrease in right upper quadrant pain    Time 4   Period Weeks   Status On-going     CC Long Term Goal  #5   Title Pt will increase repetitions fo sit to stand in 30 seconds to 8 demonstrating and increase in generalized strength and function   Baseline 6 in 30 seconds limited by leg pain   Time 4   Period Weeks   Status On-going            Plan - 11/10/16 1719    Clinical Impression Statement Pt appears to be doing worse with functional mobity due to pain in her left groin and ankle.  She did not receive relief from visit to MD at [facility].  Not sent to [facility] regarding pt persistent problems    Rehab Potential Good   Clinical Impairments Affecting Rehab Potential previous radiation, 4 nodes removed,  chronic pain limiting her activities    PT Frequency 2x / week   PT Duration 4 weeks   PT Next Visit Plan  consult with PT colleagues form more treatment ideas for tpt (dry needling????) check for response from [facility], continue moist heat and soft tissue work to low back with core exercise and range of motion, manual lymph drainage to right breast and soft tissue work to right axilla and lateral chest, if needed    PT Plan --      Patient will benefit from skilled therapeutic intervention in order to improve the following deficits and impairments:  Obesity, Increased edema, Increased fascial restricitons, Impaired UE functional use, Pain, Decreased mobility, Difficulty walking, Increased muscle spasms, Decreased range of motion, Impaired perceived functional ability, Postural dysfunction, Impaired flexibility  Visit  Diagnosis: Lymphedema, not elsewhere classified  Low back pain with left-sided sciatica, unspecified back pain laterality,  unspecified chronicity  Physical deconditioning  Pain in right arm     Problem List Patient Active Problem List   Diagnosis Date Noted  . Lymphedema 09/17/2015  . Long term current use of anticoagulant therapy 08/23/2015  . DVT, lower extremity ([place]) 06/18/2015  . Bilateral knee pain 04/03/2015  . Arm edema 08/28/2014  . Vitamin D deficiency 04/23/2014  . Hyperparathyroidism ([place]) 04/23/2014  . Depression 07/18/2013  . Malignant neoplasm of upper-outer quadrant of right breast in female, estrogen receptor positive ([place]) 07/15/2013  . Encounter for therapeutic drug monitoring 07/05/2013  . Overactive bladder 01/30/2013  . Primary hypercoagulable state ([place]) 12/19/2012  . HTN (hypertension) 09/27/2011  . Hearing loss 09/27/2011  . Chest wall pain 09/27/2011  . Seasonal allergies 09/27/2011  . SOB (shortness of breath), related to deconditioning 08/26/2011  . Hypothyroid 08/26/2011   [name]. Owens Shark PT  [name] 11/10/2016, 5:23 PM  [place], [name], [address] Phone: [phone]   Fax:  [phone]  Name: JASPER RUMINSKI MRN: [id number] Date of Birth: 1940-10-31

## 2016-11-11 ENCOUNTER — Telehealth: Payer: Self-pay

## 2016-11-11 DIAGNOSIS — Z86711 Personal history of pulmonary embolism: Secondary | ICD-10-CM

## 2016-11-11 DIAGNOSIS — M25561 Pain in right knee: Secondary | ICD-10-CM

## 2016-11-11 DIAGNOSIS — M25562 Pain in left knee: Secondary | ICD-10-CM

## 2016-11-11 DIAGNOSIS — C50411 Malignant neoplasm of upper-outer quadrant of right female breast: Secondary | ICD-10-CM

## 2016-11-11 MED ORDER — WARFARIN SODIUM 5 MG PO TABS: ORAL_TABLET | ORAL | 3 refills | Status: DC

## 2016-11-11 NOTE — Telephone Encounter (Signed)
Alicia Vasquez received e-mail from PT requesting pain clinic referral. S/w Dr Jana Hakim and he said yes and to send to Dr Nicholaus Bloom. S/w pt and sent referral

## 2016-11-11 NOTE — Telephone Encounter (Signed)
S/w pt about Jantoven vs warfarin on MAR making sure there were not any insurance issues, then took warfarin off MAR as a duplicate. Refilled Jantoven.

## 2016-11-15 ENCOUNTER — Ambulatory Visit (HOSPITAL_COMMUNITY)
Admission: EM | Admit: 2016-11-15 | Discharge: 2016-11-15 | Disposition: A | Discharge status: Home / Self Care | Payer: Medicare Other | Attending: Family Medicine | Admitting: Family Medicine

## 2016-11-15 ENCOUNTER — Ambulatory Visit (INDEPENDENT_AMBULATORY_CARE_PROVIDER_SITE_OTHER): Payer: Medicare Other

## 2016-11-15 ENCOUNTER — Encounter (HOSPITAL_COMMUNITY): Payer: Self-pay | Admitting: Emergency Medicine

## 2016-11-15 ENCOUNTER — Ambulatory Visit: Payer: Medicare Other | Admitting: Physical Therapy

## 2016-11-15 DIAGNOSIS — M25552 Pain in left hip: Secondary | ICD-10-CM | POA: Diagnosis not present

## 2016-11-15 DIAGNOSIS — M25611 Stiffness of right shoulder, not elsewhere classified: Secondary | ICD-10-CM | POA: Diagnosis not present

## 2016-11-15 DIAGNOSIS — M79601 Pain in right arm: Secondary | ICD-10-CM | POA: Diagnosis not present

## 2016-11-15 DIAGNOSIS — L989 Disorder of the skin and subcutaneous tissue, unspecified: Secondary | ICD-10-CM | POA: Diagnosis not present

## 2016-11-15 DIAGNOSIS — Z9181 History of falling: Secondary | ICD-10-CM | POA: Diagnosis not present

## 2016-11-15 DIAGNOSIS — R600 Localized edema: Secondary | ICD-10-CM

## 2016-11-15 DIAGNOSIS — M19072 Primary osteoarthritis, left ankle and foot: Secondary | ICD-10-CM

## 2016-11-15 DIAGNOSIS — I890 Lymphedema, not elsewhere classified: Secondary | ICD-10-CM | POA: Diagnosis not present

## 2016-11-15 DIAGNOSIS — R5381 Other malaise: Secondary | ICD-10-CM

## 2016-11-15 DIAGNOSIS — M25572 Pain in left ankle and joints of left foot: Secondary | ICD-10-CM | POA: Diagnosis not present

## 2016-11-15 DIAGNOSIS — R609 Edema, unspecified: Secondary | ICD-10-CM

## 2016-11-15 DIAGNOSIS — M5442 Lumbago with sciatica, left side: Secondary | ICD-10-CM | POA: Diagnosis not present

## 2016-11-15 LAB — POCT URINALYSIS DIP (DEVICE)
Bilirubin Urine: NEGATIVE
Glucose, UA: NEGATIVE mg/dL
HGB URINE DIPSTICK: NEGATIVE
Ketones, ur: NEGATIVE mg/dL
LEUKOCYTES UA: NEGATIVE
NITRITE: NEGATIVE
PH: 7 (ref 5.0–8.0)
PROTEIN: NEGATIVE mg/dL
SPECIFIC GRAVITY, URINE: 1.015 (ref 1.005–1.030)
UROBILINOGEN UA: 0.2 mg/dL (ref 0.0–1.0)

## 2016-11-15 MED ORDER — HYDROCODONE-ACETAMINOPHEN 5-325 MG PO TABS: 1.0000 | ORAL_TABLET | Freq: Four times a day (QID) | ORAL | 0 refills | Status: DC | PRN

## 2016-11-15 MED ORDER — TRIAMTERENE-HCTZ 37.5-25 MG PO TABS: 1.0000 | ORAL_TABLET | Freq: Every day | ORAL | 0 refills | Status: DC

## 2016-11-15 NOTE — Therapy (Signed)
[place], [name], [address] Phone: [phone]   Fax:  [phone]  Physical Therapy Treatment  Patient Details  Name: Alicia Vasquez MRN: [id number] Date of Birth: Oct 16, 1940 Referring Provider: Tivis Ringer   Encounter Date: 11/15/2016      PT End of Session - 11/15/16 2001    Visit Number 11   Number of Visits 20   Date for PT Re-Evaluation 12/23/16   PT Start Time 6606  pt arrived late   PT Stop Time 1515   PT Time Calculation (min) 30 min   Activity Tolerance Patient limited by pain   Behavior During Therapy Anxious      Past Medical History:  Diagnosis Date  . Allergy   . Anxiety   . Arthritis   . Blood transfusion without reported diagnosis   . Breast cancer ([place]) 07/12/13   invasive mammary carcinoma  . DVT (deep vein thrombosis) in pregnancy ([place])   . Hypertension   . Hypothyroid   . Personal history of radiation therapy   . Pneumonia   . Radiation 11/21/13-01/07/14   Right Breast/supraclav.    Past Surgical History:  Procedure Laterality Date  . biospy     female organs  . BREAST LUMPECTOMY Right 08/30/2013  . BREAST LUMPECTOMY WITH RADIOACTIVE SEED LOCALIZATION Right 09/09/2013   Procedure: BREAST LUMPECTOMY WITH RADIOACTIVE SEED LOCALIZATION WITH AXILLARY NODE EXCISION;  Surgeon: [name], MD;  Location: [facility];  Service: General;  Laterality: Right;  . DILATION AND CURETTAGE OF UTERUS    . RE-EXCISION OF BREAST LUMPECTOMY Right 09/24/2013   Procedure: RE-EXCISION OF RIGHT BREAST LUMPECTOMY;  Surgeon: [name], MD;  Location: [facility];  Service: General;  Laterality: Right;  . removal of teeth  04/19/2012   13 teeth removed    There were no vitals filed for this visit.      Subjective Assessment - 11/15/16 1956    Subjective Pt arrives to PT 15 minutes late very upset, in tears.  She states "I can't get  good medical care anywhere" She states she is also upset because she needs to find an new primary doctor as she received a notice that she cannot keep going to the [facility]  She grimaces whenever she tries to stand on her left leg.  She continues to use a borrowed walker that appears to be too short for her and she has to lean over it.  She did hear from the [facility] about going to a pain doctor but she has to wait 2-3 months to get an appointment    Pertinent History breast cancer in 2015 with lumpectomy and 4 sentinel nodes with second surgery to get clear margins.  She underwent  radiation.  Multiple complaints of pain in back, left legs neck right  shoulder and arm. She had a near fall in December and twisted her back and caught herself with right arm and has pain since. Pt reports she has not followed up with community exercise program   Patient Stated Goals get some relief from the pain in her right axilla    Currently in Pain? Yes   Pain Score --  did not rate    Pain Location Groin  and ankle    Pain Orientation Left   Pain Descriptors / Indicators Stabbing;Sharp   Pain Radiating Towards to ankle    Pain Onset More than a month ago   Pain  Frequency Constant   Aggravating Factors  walking, bearing weight on it   Pain Relieving Factors tylenol and heat pack             [facility] PT Assessment - 11/15/16 0001      Assessment   Medical Diagnosis breast cancer    Referring Provider [place]    Onset Date/Surgical Date 09/09/13   Hand Dominance Right   Prior Therapy yes prolonged episode of PT in 2016 for exercise and pain control . for lypmhedema of right arm in 2017      Precautions   Precautions Other (comment)   Precaution Comments cancer with previous radiation. no chemotherapy      Restrictions   Weight Bearing Restrictions No     Home Environment   Living Environment Private residence   Living Arrangements Non-relatives/Friends   Available  Help at Discharge Available PRN/intermittently     Prior Function   Level of [place] Retired   Leisure taking care of personal business, does not exercise regularly   "I get so weak and has shortness of breath"     Cognition   Overall Cognitive Status Within Functional Limits for tasks assessed     Observation/Other Assessments   Observations dry skin and patches at elbow, well healed incision at axilla with small full area under it.  Right breast appears to be about 25% larger than left and has fullness at bottom of breast and just below healed incision at lateral breast    Skin Integrity dry but no open area on arm.   Other Surveys  Other Surveys   Quick DASH  52.27     Observation/Other Assessments-Edema    Edema --  yes     Sensation   Light Touch Not tested     Coordination   Gross Motor Movements are Fluid and Coordinated Not tested  slow but functional      Sit to Stand   Comments 6 repetitions of sit to stan din 30 seconds with c/o increased pain in left leg      Posture/Postural Control   Posture/Postural Control Postural limitations   Postural Limitations Rounded Shoulders;Forward head;Increased lumbar lordosis;Decreased thoracic kyphosis   Posture Comments hump at C7 with flat thoracic spine below it.      AROM   Right Shoulder Flexion 145 Degrees   Right Shoulder ABduction 110 Degrees   Right Shoulder External Rotation 60 Degrees   Left Shoulder Flexion 160 Degrees   Left Shoulder ABduction 145 Degrees   Left Shoulder External Rotation 80 Degrees   Lumbar Flexion very limited lumbar flexion. lordosis maintained with trunk forward flexion   Lumbar - Right Side Bend very limited right side bending with c/o pain    Lumbar - Left Side Bend about 25 % available    Lumbar - Right Rotation 75%   Lumbar - Left Rotation 75%      Strength   Overall Strength Due to pain   Overall Strength Comments unable to isometrically test due to  multiple c/o pain,      Palpation   Palpation comment very tender to firm palpation at right axilla      Special Tests    Special Tests Hip Special Tests   Hip Special Tests  Trendelenberg Test     Trendelenburg Test   Side Left                     [facility]  Adult PT Treatment/Exercise - 11/15/16 0001      Lumbar Exercises: Standing   Other Standing Lumbar Exercises lumbar extension x 5 reps      Moist Heat Therapy   Number Minutes Moist Heat 10 Minutes   Moist Heat Location Lumbar Spine     Manual Therapy   Manual Therapy Soft tissue mobilization   Soft tissue mobilization with biotone, to low back                          [facility] Goals - 11/15/16 2014      CC Long Term Goal  #1   Title Pt will report a 50% reduction in low back pain    Time 4   Status On-going     CC Long Term Goal  #2   Title Pt will be independent in an exercise program to help with low back pain    Time 4   Period Weeks   Status New     CC Long Term Goal  #3   Title Pt will have a decrease in Quick DASH to < 38 demonstrating functional improvment of right arm     Baseline 52.27 o 09/29/2016   Time 4   Period Weeks   Status On-going     CC Long Term Goal  #4   Title Pt will report a 50 % decrease in right upper quadrant pain    Time 4   Period Weeks   Status On-going     CC Long Term Goal  #5   Title Pt will increase repetitions fo sit to stand in 30 seconds to 8 demonstrating and increase in generalized strength and function   Baseline 6 in 30 seconds limited by leg pain   Time 4   Period Weeks   Status On-going            Plan - 11/15/16 2002    Clinical Impression Statement Pt is upset and frustrated that she is not getting relief from her pain and that she has to use a walker to walk all the time now. She is using a borrowed walker that is too short for her so will contact MD for a prescription to get a taller walker for her. She continues to  have severe pain in her left back that radiates to groin and to ankle . She also complains of pain in her right neck and axilla with swelling in her right arm, but she does not wear her compression sleeve. Attempted to do some lumbar extension exercise today, but pt has difficulty coordinating movement to do exercise. She has seen an orthopedic doctor last week and Dr. Joseph Art today, but she is upset that she is not getting help for her pain. Would like to continue seeing her for 4 more weeks while she is waiting to see the pain doctor as she says she gets temporary relief from moist heat and soft tissue work.  I would like to continue to try extension exerciss and work with her gait with a taller walker .   Rehab Potential Good   Clinical Impairments Affecting Rehab Potential previous radiation, 4 nodes removed,  chronic pain limiting her activities    PT Frequency 2x / week   PT Duration 4 weeks   PT Treatment/Interventions Manual techniques;ADLs/Self Care Home Management;Patient/family education;Orthotic Fit/Training;Taping;Electrical Stimulation;Therapeutic activities;Therapeutic exercise;Passive range of motion;Scar mobilization;Manual lymph drainage;Compression bandaging;Moist Heat;Neuromuscular re-education;Gait training;Stair training   PT  Next Visit Plan Continue moist heat and soft tissue work, Add lumbar extension exercises, gait training. send request for walker.( [name] on 6//21/2018    Consulted and Agree with Plan of Care Patient   PT Plan Pt is upset and frustrated that she is not getting relief from her pain and that she has to use a walker to walk all the time now. She is using a borrowed walker that is too short for her so will contact MD for a prescription to get a taller walker for her. She continues to have severe pain in her left back that radiates to groin and to ankle . She also complains of pain in her right neck and axilla with swelling in her  right arm, but she does not wear her compression sleeve. Attempted to do some lumbar extension exercise today, but pt has difficulty coordinating movement to do exercise. She has seen an orthopedic doctor last week and Dr. Joseph Art today, but she is upset that she is not getting help for her pain. Would like to continue seeing her for 4 more weeks as she says she gets temporary relief from moist heat and soft tissue work.  I would like to continue to try extension exerciss and work with her gait with a taller walker .       Patient will benefit from skilled therapeutic intervention in order to improve the following deficits and impairments:     Visit Diagnosis: Lymphedema, not elsewhere classified - Plan: PT plan of care cert/re-cert  Low back pain with left-sided sciatica, unspecified back pain laterality, unspecified chronicity - Plan: PT plan of care cert/re-cert  Physical deconditioning - Plan: PT plan of care cert/re-cert  Pain in right arm - Plan: PT plan of care cert/re-cert  Stiffness of right shoulder, not elsewhere classified - Plan: PT plan of care cert/re-cert       G-Codes - Dec 14, 2016 24-Sep-2016    Functional Assessment Tool Used (Outpatient Only) Clinical Judgement    Functional Limitation Carrying, moving and handling objects   Carrying, Moving and Handling Objects Current Status (E3662) At least 40 percent but less than 60 percent impaired, limited or restricted   Carrying, Moving and Handling Objects Goal Status (H4765) At least 20 percent but less than 40 percent impaired, limited or restricted      Problem List Patient Active Problem List   Diagnosis Date Noted  . Lymphedema 09/17/2015  . Long term current use of anticoagulant therapy 08/23/2015  . DVT, lower extremity ([place]) 06/18/2015  . Bilateral knee pain 04/03/2015  . Arm edema 08/28/2014  . Vitamin D deficiency 04/23/2014  . Hyperparathyroidism ([place]) 04/23/2014  . Depression 07/18/2013  . Malignant neoplasm  of upper-outer quadrant of right breast in female, estrogen receptor positive ([place]) 07/15/2013  . Encounter for therapeutic drug monitoring 07/05/2013  . Overactive bladder 01/30/2013  . Primary hypercoagulable state ([place]) 12/19/2012  . HTN (hypertension) 09/27/2011  . Hearing loss 09/27/2011  . Chest wall pain 09/27/2011  . Seasonal allergies 09/27/2011  . SOB (shortness of breath), related to deconditioning 08/26/2011  . Hypothyroid 08/26/2011   [name]. Owens Shark PT  [name] 12/14/16, [place], [name], [address] Phone: [phone]   Fax:  [phone]  Name: CLAIRE DOLORES MRN: [id number] Date of Birth: 11-16-1940

## 2016-11-15 NOTE — ED Provider Notes (Addendum)
[facility]    CSN: [id number] Arrival date & time: 11/15/16  1245     History   Chief Complaint Chief Complaint  Patient presents with  . Foot Pain    HPI Alicia Vasquez is a 76 y.o. female.   This is a 76 year old woman who complains about left hip pain, skin rash in her left groin, and chronic pain in her left ankle. She believes that her left hip was injured when she was being examined here several months ago. She is using a walker or cane to get around at this point.  She was seen by a orthopedist recently but no x-rays are taken and she did not understand what the Asian orthopedic doctor was telling her.      Past Medical History:  Diagnosis Date  . Allergy   . Anxiety   . Arthritis   . Blood transfusion without reported diagnosis   . Breast cancer ([place]) 07/12/13   invasive mammary carcinoma  . DVT (deep vein thrombosis) in pregnancy ([place])   . Hypertension   . Hypothyroid   . Personal history of radiation therapy   . Pneumonia   . Radiation 11/21/13-01/07/14   Right Breast/supraclav.    Patient Active Problem List   Diagnosis Date Noted  . Lymphedema 09/17/2015  . Long term current use of anticoagulant therapy 08/23/2015  . DVT, lower extremity ([place]) 06/18/2015  . Bilateral knee pain 04/03/2015  . Arm edema 08/28/2014  . Vitamin D deficiency 04/23/2014  . Hyperparathyroidism ([place]) 04/23/2014  . Depression 07/18/2013  . Malignant neoplasm of upper-outer quadrant of right breast in female, estrogen receptor positive ([place]) 07/15/2013  . Encounter for therapeutic drug monitoring 07/05/2013  . Overactive bladder 01/30/2013  . Primary hypercoagulable state ([place]) 12/19/2012  . HTN (hypertension) 09/27/2011  . Hearing loss 09/27/2011  . Chest wall pain 09/27/2011  . Seasonal allergies 09/27/2011  . SOB (shortness of breath), related to deconditioning 08/26/2011  . Hypothyroid 08/26/2011    Past Surgical History:  Procedure Laterality Date  .  biospy     female organs  . BREAST LUMPECTOMY Right 08/30/2013  . BREAST LUMPECTOMY WITH RADIOACTIVE SEED LOCALIZATION Right 09/09/2013   Procedure: BREAST LUMPECTOMY WITH RADIOACTIVE SEED LOCALIZATION WITH AXILLARY NODE EXCISION;  Surgeon: [name], MD;  Location: [place];  Service: General;  Laterality: Right;  . DILATION AND CURETTAGE OF UTERUS    . RE-EXCISION OF BREAST LUMPECTOMY Right 09/24/2013   Procedure: RE-EXCISION OF RIGHT BREAST LUMPECTOMY;  Surgeon: [name], MD;  Location: [place];  Service: General;  Laterality: Right;  . removal of teeth  04/19/2012   13 teeth removed    OB History    No data available       Home Medications    Prior to Admission medications   Medication Sig Start Date End Date Taking? Authorizing Provider  acetaminophen (TYLENOL) 500 MG tablet Take 500 mg by mouth every 6 (six) hours as needed for mild pain, fever or headache. Reported on 09/08/2015    [provider]  alendronate (FOSAMAX) 70 MG tablet Take 1 tablet (70 mg total) by mouth once a week. Take with a full glass of water on an empty stomach. Patient not taking: Reported on 09/29/2016 09/26/16   Gardenia Phlegm, NP  Cholecalciferol (VITAMIN D3) 5000 UNITS TABS Take by mouth.    [provider]  HYDROcodone-acetaminophen (NORCO) 5-325 MG tablet Take 1 tablet by mouth every 6 (six) hours  as needed for moderate pain. 11/15/16   [name], MD  levothyroxine (SYNTHROID) 125 MCG tablet Take 1 tablet (125 mcg total) by mouth daily before breakfast. Dispense as written: Synthroid LAST REFILL 04/15/16   [name], MD  lisinopril (PRINIVIL,ZESTRIL) 10 MG tablet TAKE 1 TABLET (10 MG TOTAL) BY MOUTH DAILY. 09/26/16   Magrinat, [name], MD  triamterene-hydrochlorothiazide (MAXZIDE-25) 37.5-25 MG tablet Take 1 tablet by mouth daily. 11/15/16   [name], MD  warfarin (JANTOVEN) 5 MG tablet TAKE AS DIRECTED BY  ANTICOAGULATION CLINIC 11/11/16   Magrinat, [name], MD    Family History Family History  Problem Relation Age of Onset  . Cancer Brother        prostate  . Heart disease Brother   . Colon cancer Brother     Social History Social History  Substance Use Topics  . Smoking status: Never Smoker  . Smokeless tobacco: Never Used  . Alcohol use No     Allergies   Anesthetics, amide; Benadryl [diphenhydramine hcl]; Carbocaine [mepivacaine hcl]; Codeine; Epinephrine; Sulfa antibiotics; and Penicillins   Review of Systems Review of Systems  Constitutional: Negative.   Musculoskeletal: Positive for gait problem.  All other systems reviewed and are negative.    Physical Exam Triage Vital Signs ED Triage Vitals [11/15/16 1306]  Enc Vitals Group     BP      Pulse      Resp      Temp      Temp src      SpO2      Weight      Height      Head Circumference      Peak Flow      Pain Score 0     Pain Loc      Pain Edu?      Excl. in [place]?    No data found.   Updated Vital Signs BP 135/60 (BP Location: Left Arm)   Pulse 83   Temp 98.1 F (36.7 C) (Oral)   SpO2 95%     Physical Exam  Constitutional: She appears well-developed and well-nourished.  HENT:  Right Ear: External ear normal.  Left Ear: External ear normal.  Eyes: Conjunctivae are normal. Pupils are equal, round, and reactive to light.  Neck: Normal range of motion. Neck supple.  Pulmonary/Chest: Effort normal.  Abdominal: Soft. There is no tenderness.  Musculoskeletal: She exhibits edema and tenderness. She exhibits no deformity.  Patient has difficulty standing. She has mild edema bilaterally over her ankles and tenderness on her left Achilles   Neurological: She is alert.  Skin: Skin is warm and dry.  Nursing note and vitals reviewed.    UC Treatments / Results  Labs (all labs ordered are listed, but only abnormal results are displayed) Labs Reviewed  POCT URINALYSIS DIP (DEVICE)    EKG  EKG  Interpretation None       Radiology Dg Ankle Complete Left  Result Date: 11/15/2016 CLINICAL DATA:  Chronic left ankle pain associated with numbness. The pain is centered over the lateral malleolus and radiates into the lateral calf. 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 EXAM: LEFT ANKLE COMPLETE - 3+ VIEW COMPARISON:  None in PACs FINDINGS: The bones are subjectively adequately mineralized. The ankle joint mortise is preserved. The talar dome is intact. Tiny spurs arise from the inferior tips of the medial and lateral malleolar lie. The talus and calcaneus exhibit no acute abnormalities. There are plantar and Achilles  region calcaneal spurs. There is no significant soft tissue swelling. IMPRESSION: There is no acute bony abnormality of the left ankle. Tiny spurs arise from the tips of the malleolar lie. Electronically Signed   By: David  Martinique M.D.   On: 11/15/2016 13:51    Procedures Procedures (including critical care time)  Medications Ordered in UC Medications - No data to display   Initial Impression / Assessment and Plan / UC Course  I have reviewed the triage vital signs and the nursing notes.  Pertinent labs & imaging results that were available during my care of the patient were reviewed by me and considered in my medical decision making (see chart for details).     Final Clinical Impressions(s) / UC Diagnoses   Final diagnoses:  Localized osteoarthritis of left ankle  Pedal edema  Left hip pain  Skin lesion    New Prescriptions New Prescriptions   HYDROCODONE-ACETAMINOPHEN (NORCO) 5-325 MG TABLET    Take 1 tablet by mouth every 6 (six) hours as needed for moderate pain.   TRIAMTERENE-HYDROCHLOROTHIAZIDE (MAXZIDE-25) 37.5-25 MG TABLET    Take 1 tablet by mouth daily.     [name], MD 11/15/16 1408    [name], MD 11/15/16 1428

## 2016-11-15 NOTE — Discharge Instructions (Signed)
You need to make an appointment with the community wellness center below to continue your general medical care.  I can no longer be your primary care doctor.  The ankle x-ray shows some mild arthritis, but is otherwise normal.  At this point, Tylenol is probably the best medicine to control the symptoms. I'm giving you some mild diuretic to get rid of some swelling in the ankles which should help as well.

## 2016-11-15 NOTE — ED Triage Notes (Signed)
The patient presented to the [facility] with multiple complaints and requested to see Dr. Joseph Art.

## 2016-11-17 ENCOUNTER — Other Ambulatory Visit (HOSPITAL_BASED_OUTPATIENT_CLINIC_OR_DEPARTMENT_OTHER): Payer: Medicare Other

## 2016-11-17 ENCOUNTER — Ambulatory Visit: Payer: Medicare Other | Admitting: Physical Therapy

## 2016-11-17 DIAGNOSIS — M5442 Lumbago with sciatica, left side: Secondary | ICD-10-CM

## 2016-11-17 DIAGNOSIS — I890 Lymphedema, not elsewhere classified: Secondary | ICD-10-CM | POA: Diagnosis not present

## 2016-11-17 DIAGNOSIS — I82403 Acute embolism and thrombosis of unspecified deep veins of lower extremity, bilateral: Secondary | ICD-10-CM | POA: Diagnosis not present

## 2016-11-17 DIAGNOSIS — Z9181 History of falling: Secondary | ICD-10-CM

## 2016-11-17 DIAGNOSIS — M79601 Pain in right arm: Secondary | ICD-10-CM

## 2016-11-17 DIAGNOSIS — M8580 Other specified disorders of bone density and structure, unspecified site: Secondary | ICD-10-CM

## 2016-11-17 DIAGNOSIS — R5381 Other malaise: Secondary | ICD-10-CM | POA: Diagnosis not present

## 2016-11-17 DIAGNOSIS — M25611 Stiffness of right shoulder, not elsewhere classified: Secondary | ICD-10-CM | POA: Diagnosis not present

## 2016-11-17 DIAGNOSIS — Z170 Estrogen receptor positive status [ER+]: Principal | ICD-10-CM

## 2016-11-17 DIAGNOSIS — C50411 Malignant neoplasm of upper-outer quadrant of right female breast: Secondary | ICD-10-CM

## 2016-11-17 LAB — PROTIME-INR
INR: 3 (ref 2.00–3.50)
Protime: 36 Seconds — ABNORMAL HIGH (ref 10.6–13.4)

## 2016-11-17 LAB — CBC WITH DIFFERENTIAL/PLATELET
BASO%: 0.8 % (ref 0.0–2.0)
BASOS ABS: 0.1 10*3/uL (ref 0.0–0.1)
EOS ABS: 0.2 10*3/uL (ref 0.0–0.5)
EOS%: 2.6 % (ref 0.0–7.0)
HCT: 41.9 % (ref 34.8–46.6)
HEMOGLOBIN: 13.8 g/dL (ref 11.6–15.9)
LYMPH%: 25.9 % (ref 14.0–49.7)
MCH: 30.9 pg (ref 25.1–34.0)
MCHC: 32.9 g/dL (ref 31.5–36.0)
MCV: 93.9 fL (ref 79.5–101.0)
MONO#: 1 10*3/uL — AB (ref 0.1–0.9)
MONO%: 16.8 % — ABNORMAL HIGH (ref 0.0–14.0)
NEUT%: 53.9 % (ref 38.4–76.8)
NEUTROS ABS: 3.3 10*3/uL (ref 1.5–6.5)
PLATELETS: 196 10*3/uL (ref 145–400)
RBC: 4.46 10*6/uL (ref 3.70–5.45)
RDW: 14.1 % (ref 11.2–14.5)
WBC: 6.1 10*3/uL (ref 3.9–10.3)
lymph#: 1.6 10*3/uL (ref 0.9–3.3)

## 2016-11-17 NOTE — Therapy (Signed)
[place], [name], [address] Phone: [phone]   Fax:  [phone]  Physical Therapy Treatment  Patient Details  Name: Alicia Vasquez MRN: [id number] Date of Birth: [birth year] Referring Provider: Tivis Ringer   Encounter Date: 11/17/2016      PT End of Session - 11/17/16 1726    Visit Number 12   Number of Visits 20   Date for PT Re-Evaluation 12/23/16   PT Start Time 1625  pt arrived late    PT Stop Time 1645   PT Time Calculation (min) 20 min   Activity Tolerance Patient limited by pain   Behavior During Therapy [facility] for tasks assessed/performed      Past Medical History:  Diagnosis Date  . Allergy   . Anxiety   . Arthritis   . Blood transfusion without reported diagnosis   . Breast cancer ([place]) 07/12/13   invasive mammary carcinoma  . DVT (deep vein thrombosis) in pregnancy ([place])   . Hypertension   . Hypothyroid   . Personal history of radiation therapy   . Pneumonia   . Radiation 11/21/13-01/07/14   Right Breast/supraclav.    Past Surgical History:  Procedure Laterality Date  . biospy     female organs  . BREAST LUMPECTOMY Right 08/30/2013  . BREAST LUMPECTOMY WITH RADIOACTIVE SEED LOCALIZATION Right 09/09/2013   Procedure: BREAST LUMPECTOMY WITH RADIOACTIVE SEED LOCALIZATION WITH AXILLARY NODE EXCISION;  Surgeon: [name], MD;  Location: [place];  Service: General;  Laterality: Right;  . DILATION AND CURETTAGE OF UTERUS    . RE-EXCISION OF BREAST LUMPECTOMY Right 09/24/2013   Procedure: RE-EXCISION OF RIGHT BREAST LUMPECTOMY;  Surgeon: [name], MD;  Location: [place];  Service: General;  Laterality: Right;  . removal of teeth  04/19/2012   13 teeth removed    There were no vitals filed for this visit.      Subjective Assessment - 11/17/16 1715    Subjective Pt arrives late after having been at cancer center.   She reports she is in pain and she hasn't taken her tylenol today.  She states she did not get an acutal appointment with the pain clinic. She is using a borrowed walker, but states she almost fell at the cancer center earlier today    Pertinent History breast cancer in 2015 with lumpectomy and 4 sentinel nodes with second surgery to get clear margins.  She underwent  radiation.  Multiple complaints of pain in back, left legs neck right  shoulder and arm. She had a near fall in December and twisted her back and caught herself with right arm and has pain since. Pt reports she has not followed up with community exercise program   Patient Stated Goals get some relief from the pain in her right axilla    Currently in Pain? Yes   Pain Score --  did not rate                         [facility] Adult PT Treatment/Exercise - 11/17/16 0001      Ambulation/Gait   Gait Comments tried a taller walker with patient and worked with her on stepping more into walker and standing up tall to keep back in extension and decrease the weight bearing through  arms.  Adjusted it to be at least on inch taller and gave pt a presctiption to get a rolling walker that was  sent over by Wilber Bihari, NP                PT Education - 11/17/16 1725    Education provided Yes   Education Details Use of rolling walker    Person(s) Educated Patient   Methods Explanation;Demonstration   Comprehension Need further instruction                [facility] Goals - 11/15/16 2014      CC Long Term Goal  #1   Title Pt will report a 50% reduction in low back pain    Time 4   Status On-going     CC Long Term Goal  #2   Title Pt will be independent in an exercise program to help with low back pain    Time 4   Period Weeks   Status New     CC Long Term Goal  #3   Title Pt will have a decrease in Quick DASH to < 38 demonstrating functional improvment of right arm     Baseline 52.27 o 09/29/2016    Time 4   Period Weeks   Status On-going     CC Long Term Goal  #4   Title Pt will report a 50 % decrease in right upper quadrant pain    Time 4   Period Weeks   Status On-going     CC Long Term Goal  #5   Title Pt will increase repetitions fo sit to stand in 30 seconds to 8 demonstrating and increase in generalized strength and function   Baseline 6 in 30 seconds limited by leg pain   Time 4   Period Weeks   Status On-going            Plan - 11/17/16 1727    Rehab Potential Good   Clinical Impairments Affecting Rehab Potential previous radiation, 4 nodes removed,  chronic pain limiting her activities    PT Frequency 2x / week   PT Next Visit Plan Continue moist heat and soft tissue work, Add lumbar extension exercises, gait training.    Consulted and Agree with Plan of Care Patient   PT Plan Pt still having pain in left leg and needs more gait training with a rolling walker that is more adjusted to her height. She needs help getting an appointment with pain clinic and finding a primary care physician. Will contact Wilber Bihari to see if there are resources at the [facility] to help her with this.       Patient will benefit from skilled therapeutic intervention in order to improve the following deficits and impairments:  Obesity, Increased edema, Increased fascial restricitons, Impaired UE functional use, Pain, Decreased mobility, Difficulty walking, Increased muscle spasms, Decreased range of motion, Impaired perceived functional ability, Postural dysfunction, Impaired flexibility  Visit Diagnosis: Lymphedema, not elsewhere classified  Low back pain with left-sided sciatica, unspecified back pain laterality, unspecified chronicity  Physical deconditioning  Pain in right arm  Stiffness of right shoulder, not elsewhere classified  Risk for falls     Problem List Patient Active Problem List   Diagnosis Date Noted  . Lymphedema 09/17/2015  . Long term current  use of anticoagulant therapy 08/23/2015  . DVT, lower extremity ([place]) 06/18/2015  . Bilateral knee pain 04/03/2015  . Arm edema 08/28/2014  . Vitamin D deficiency 04/23/2014  . Hyperparathyroidism ([place]) 04/23/2014  . Depression 07/18/2013  . Malignant neoplasm of upper-outer quadrant of right  breast in female, estrogen receptor positive ([place]) 07/15/2013  . Encounter for therapeutic drug monitoring 07/05/2013  . Overactive bladder 01/30/2013  . Primary hypercoagulable state ([place]) 12/19/2012  . HTN (hypertension) 09/27/2011  . Hearing loss 09/27/2011  . Chest wall pain 09/27/2011  . Seasonal allergies 09/27/2011  . SOB (shortness of breath), related to deconditioning 08/26/2011  . Hypothyroid 08/26/2011   [name]. Owens Shark PT  [name] 11/17/2016, 5:30 PM  [place], [name], [address] Phone: [phone]   Fax:  [phone]  Name: AMORY ZBIKOWSKI MRN: [id number] Date of Birth: 06-19-1940

## 2016-11-22 ENCOUNTER — Ambulatory Visit: Payer: Medicare Other | Admitting: Physical Therapy

## 2016-11-22 ENCOUNTER — Encounter: Payer: Medicare Other | Admitting: Physical Therapy

## 2016-11-22 DIAGNOSIS — R5381 Other malaise: Secondary | ICD-10-CM

## 2016-11-22 DIAGNOSIS — M5442 Lumbago with sciatica, left side: Secondary | ICD-10-CM

## 2016-11-22 DIAGNOSIS — M79601 Pain in right arm: Secondary | ICD-10-CM

## 2016-11-22 DIAGNOSIS — M25611 Stiffness of right shoulder, not elsewhere classified: Secondary | ICD-10-CM

## 2016-11-22 DIAGNOSIS — Z9181 History of falling: Secondary | ICD-10-CM | POA: Diagnosis not present

## 2016-11-22 DIAGNOSIS — I890 Lymphedema, not elsewhere classified: Secondary | ICD-10-CM

## 2016-11-22 NOTE — Patient Instructions (Addendum)
Per Thedore Mins, call Dr. Briscoe Deutscher for family doctor appointment [phone]   Also call Raliegh Ip for an appointment to evaluate back pain, left leg and ankle pain

## 2016-11-22 NOTE — Therapy (Signed)
[place], [name], [address] Phone: [phone]   Fax:  [phone]  Physical Therapy Treatment  Patient Details  Name: Alicia Vasquez MRN: [id number] Date of Birth: 03-05-1941 Referring Provider: Tivis Ringer   Encounter Date: 11/22/2016      PT End of Session - 11/22/16 1717    Visit Number 13   Number of Visits 20   Date for PT Re-Evaluation 12/23/16   PT Start Time 1404   PT Stop Time 1430   PT Time Calculation (min) 26 min   Activity Tolerance Patient tolerated treatment well   Behavior During Therapy [facility] for tasks assessed/performed      Past Medical History:  Diagnosis Date  . Allergy   . Anxiety   . Arthritis   . Blood transfusion without reported diagnosis   . Breast cancer ([place]) 07/12/13   invasive mammary carcinoma  . DVT (deep vein thrombosis) in pregnancy ([place])   . Hypertension   . Hypothyroid   . Personal history of radiation therapy   . Pneumonia   . Radiation 11/21/13-01/07/14   Right Breast/supraclav.    Past Surgical History:  Procedure Laterality Date  . biospy     female organs  . BREAST LUMPECTOMY Right 08/30/2013  . BREAST LUMPECTOMY WITH RADIOACTIVE SEED LOCALIZATION Right 09/09/2013   Procedure: BREAST LUMPECTOMY WITH RADIOACTIVE SEED LOCALIZATION WITH AXILLARY NODE EXCISION;  Surgeon: [name], MD;  Location: [place];  Service: General;  Laterality: Right;  . DILATION AND CURETTAGE OF UTERUS    . RE-EXCISION OF BREAST LUMPECTOMY Right 09/24/2013   Procedure: RE-EXCISION OF RIGHT BREAST LUMPECTOMY;  Surgeon: [name], MD;  Location: [place];  Service: General;  Laterality: Right;  . removal of teeth  04/19/2012   13 teeth removed    There were no vitals filed for this visit.      Subjective Assessment - 11/22/16 1710    Subjective Pt arrives late again, complaining of many problems at home and  that her ride made her late. She comes in with a new walker but states it it too heavy.  Reinforced that pt should not be picking walker up unless she is putting it in the car, she should roll the walker    Pertinent History breast cancer in 2015 with lumpectomy and 4 sentinel nodes with second surgery to get clear margins.  She underwent  radiation.  Multiple complaints of pain in back, left legs neck right  shoulder and arm. She had a near fall in December and twisted her back and caught herself with right arm and has pain since. Pt reports she has not followed up with community exercise program   Patient Stated Goals get some relief from the pain in her right axilla    Currently in Pain? Yes   Pain Score --  did not rate, pt grimacing due to pain in left groin                          [facility] Adult PT Treatment/Exercise - 11/22/16 0001      Ambulation/Gait   Gait Comments adjusted walker to correct height and reinforced pt shoulder keep hands on hand grips,( not lean forward over walker and put hands too far forward, to stand up straight and step into walker.  Pt walked total of aobut 250 feet.      Self-Care   Other Self-Care  Comments  educated pt on MD that Wilber Bihari recommends, printed out MD demographic sheet and handed it to patient with attention to phone number for patinet to call.  Also suggested she contact Raliegh Ip for orthopedic appointment.  Encouraged pt to walk as much as she can.      Moist Heat Therapy   Number Minutes Moist Heat 10 Minutes   Moist Heat Location Lumbar Spine                PT Education - 11/22/16 1716    Education provided Yes   Education Details use of rolling walker Family Medicine MD phone number and address    Person(s) Educated Patient   Methods Explanation;Handout   Comprehension Verbalized understanding                [facility] Goals - 11/15/16 2014      CC Long Term Goal  #1   Title Pt will  report a 50% reduction in low back pain    Time 4   Status On-going     CC Long Term Goal  #2   Title Pt will be independent in an exercise program to help with low back pain    Time 4   Period Weeks   Status New     CC Long Term Goal  #3   Title Pt will have a decrease in Quick DASH to < 38 demonstrating functional improvment of right arm     Baseline 52.27 o 09/29/2016   Time 4   Period Weeks   Status On-going     CC Long Term Goal  #4   Title Pt will report a 50 % decrease in right upper quadrant pain    Time 4   Period Weeks   Status On-going     CC Long Term Goal  #5   Title Pt will increase repetitions fo sit to stand in 30 seconds to 8 demonstrating and increase in generalized strength and function   Baseline 6 in 30 seconds limited by leg pain   Time 4   Period Weeks   Status On-going            Plan - 11/22/16 1717    Rehab Potential Good   Clinical Impairments Affecting Rehab Potential previous radiation, 4 nodes removed,  chronic pain limiting her activities    PT Frequency 2x / week   PT Duration 4 weeks   PT Treatment/Interventions Manual techniques;ADLs/Self Care Home Management;Patient/family education;Orthotic Fit/Training;Taping;Electrical Stimulation;Therapeutic activities;Therapeutic exercise;Passive range of motion;Scar mobilization;Manual lymph drainage;Compression bandaging;Moist Heat;Neuromuscular re-education;Gait training;Stair training   PT Next Visit Plan Continue moist heat and soft tissue work, Add lumbar extension exercises, gait training.    Consulted and Agree with Plan of Care Patient   PT Plan Patient was able to walk better with RW after gair training, but she still needs cues for posture. she continues to get some relief with hot pack, though did not have time for soft tissue work today.  Pt happy to get referral for primary care physician and said she will call for appointmetn       Patient will benefit from skilled therapeutic  intervention in order to improve the following deficits and impairments:  Obesity, Increased edema, Increased fascial restricitons, Impaired UE functional use, Pain, Decreased mobility, Difficulty walking, Increased muscle spasms, Decreased range of motion, Impaired perceived functional ability, Postural dysfunction, Impaired flexibility  Visit Diagnosis: Lymphedema, not elsewhere classified  Physical deconditioning  Low back pain with left-sided sciatica, unspecified back pain laterality, unspecified chronicity  Pain in right arm  Risk for falls  Stiffness of right shoulder, not elsewhere classified     Problem List Patient Active Problem List   Diagnosis Date Noted  . Lymphedema 09/17/2015  . Long term current use of anticoagulant therapy 08/23/2015  . DVT, lower extremity ([place]) 06/18/2015  . Bilateral knee pain 04/03/2015  . Arm edema 08/28/2014  . Vitamin D deficiency 04/23/2014  . Hyperparathyroidism ([place]) 04/23/2014  . Depression 07/18/2013  . Malignant neoplasm of upper-outer quadrant of right breast in female, estrogen receptor positive ([place]) 07/15/2013  . Encounter for therapeutic drug monitoring 07/05/2013  . Overactive bladder 01/30/2013  . Primary hypercoagulable state ([place]) 12/19/2012  . HTN (hypertension) 09/27/2011  . Hearing loss 09/27/2011  . Chest wall pain 09/27/2011  . Seasonal allergies 09/27/2011  . SOB (shortness of breath), related to deconditioning 08/26/2011  . Hypothyroid 08/26/2011   [name]. Owens Shark PT  [name] 11/22/2016, 5:20 PM  [place], [name], [address] Phone: [phone]   Fax:  [phone]  Name: Alicia Vasquez MRN: [id number] Date of Birth: 11-08-1940

## 2016-11-24 ENCOUNTER — Encounter: Payer: Medicare Other | Admitting: Physical Therapy

## 2016-11-29 ENCOUNTER — Encounter: Payer: Medicare Other | Admitting: Physical Therapy

## 2016-11-29 ENCOUNTER — Ambulatory Visit: Payer: Medicare Other | Attending: Adult Health | Admitting: Physical Therapy

## 2016-11-29 DIAGNOSIS — M25611 Stiffness of right shoulder, not elsewhere classified: Secondary | ICD-10-CM | POA: Diagnosis not present

## 2016-11-29 DIAGNOSIS — I890 Lymphedema, not elsewhere classified: Secondary | ICD-10-CM | POA: Insufficient documentation

## 2016-11-29 DIAGNOSIS — R5381 Other malaise: Secondary | ICD-10-CM | POA: Diagnosis not present

## 2016-11-29 DIAGNOSIS — M79601 Pain in right arm: Secondary | ICD-10-CM | POA: Insufficient documentation

## 2016-11-29 DIAGNOSIS — Z9181 History of falling: Secondary | ICD-10-CM | POA: Diagnosis not present

## 2016-11-29 DIAGNOSIS — M5442 Lumbago with sciatica, left side: Secondary | ICD-10-CM | POA: Diagnosis not present

## 2016-11-29 NOTE — Therapy (Signed)
[place], [name], [address] Phone: [phone]   Fax:  [phone]  Physical Therapy Treatment  Patient Details  Name: Alicia Vasquez MRN: [id number] Date of Birth: 09/30/1940 Referring Provider: Tivis Ringer   Encounter Date: 11/29/2016      PT End of Session - 11/29/16 1739    Visit Number 14   Number of Visits 20   Date for PT Re-Evaluation 12/23/16   PT Start Time 2376   PT Stop Time 1600   PT Time Calculation (min) 30 min   Activity Tolerance Patient tolerated treatment well   Behavior During Therapy [facility] for tasks assessed/performed      Past Medical History:  Diagnosis Date  . Allergy   . Anxiety   . Arthritis   . Blood transfusion without reported diagnosis   . Breast cancer ([place]) 07/12/13   invasive mammary carcinoma  . DVT (deep vein thrombosis) in pregnancy ([place])   . Hypertension   . Hypothyroid   . Personal history of radiation therapy   . Pneumonia   . Radiation 11/21/13-01/07/14   Right Breast/supraclav.    Past Surgical History:  Procedure Laterality Date  . biospy     female organs  . BREAST LUMPECTOMY Right 08/30/2013  . BREAST LUMPECTOMY WITH RADIOACTIVE SEED LOCALIZATION Right 09/09/2013   Procedure: BREAST LUMPECTOMY WITH RADIOACTIVE SEED LOCALIZATION WITH AXILLARY NODE EXCISION;  Surgeon: [name], MD;  Location: [place];  Service: General;  Laterality: Right;  . DILATION AND CURETTAGE OF UTERUS    . RE-EXCISION OF BREAST LUMPECTOMY Right 09/24/2013   Procedure: RE-EXCISION OF RIGHT BREAST LUMPECTOMY;  Surgeon: [name], MD;  Location: [place];  Service: General;  Laterality: Right;  . removal of teeth  04/19/2012   13 teeth removed    There were no vitals filed for this visit.      Subjective Assessment - 11/29/16 1525    Subjective Pt arrives late.  She complains of pain all over. through chest  and arms and left groin.  She has an appointment with Dr. Briscoe Deutscher on Monday at 2:45.  She tried to get an appointment with [facility], but they did not answer the phone.    Pertinent History breast cancer in 2015 with lumpectomy and 4 sentinel nodes with second surgery to get clear margins.  She underwent  radiation.  Multiple complaints of pain in back, left legs neck right  shoulder and arm. She had a near fall in December and twisted her back and caught herself with right arm and has pain since. Pt reports she has not followed up with community exercise program   Patient Stated Goals get some relief from the pain in her right axilla    Currently in Pain? Yes   Pain Score --  groin is 10 and the rest of it is about an 8                          [facility] Adult PT Treatment/Exercise - 11/29/16 0001      Moist Heat Therapy   Number Minutes Moist Heat 20 Minutes   Moist Heat Location Lumbar Spine     Manual Therapy   Manual Therapy Soft tissue mobilization   Soft tissue mobilization with biotone, to low back  and right cervical area  [facility] Goals - 11/15/16 2014      CC Long Term Goal  #1   Title Pt will report a 50% reduction in low back pain    Time 4   Status On-going     CC Long Term Goal  #2   Title Pt will be independent in an exercise program to help with low back pain    Time 4   Period Weeks   Status New     CC Long Term Goal  #3   Title Pt will have a decrease in Quick DASH to < 38 demonstrating functional improvment of right arm     Baseline 52.27 o 09/29/2016   Time 4   Period Weeks   Status On-going     CC Long Term Goal  #4   Title Pt will report a 50 % decrease in right upper quadrant pain    Time 4   Period Weeks   Status On-going     CC Long Term Goal  #5   Title Pt will increase repetitions fo sit to stand in 30 seconds to 8 demonstrating and increase in generalized strength and  function   Baseline 6 in 30 seconds limited by leg pain   Time 4   Period Weeks   Status On-going            Plan - 11/29/16 1739    Rehab Potential Good   Clinical Impairments Affecting Rehab Potential previous radiation, 4 nodes removed,  chronic pain limiting her activities    PT Frequency 2x / week   PT Duration 4 weeks   PT Treatment/Interventions Manual techniques;ADLs/Self Care Home Management;Patient/family education;Orthotic Fit/Training;Taping;Electrical Stimulation;Therapeutic activities;Therapeutic exercise;Passive range of motion;Scar mobilization;Manual lymph drainage;Compression bandaging;Moist Heat;Neuromuscular re-education;Gait training;Stair training   Consulted and Agree with Plan of Care Patient   PT Plan Pt says that she feels better after treatment, but she still is having problems with pain management.  she has difficulty corrdinating exercises but appears to be walking better with RW       Patient will benefit from skilled therapeutic intervention in order to improve the following deficits and impairments:     Visit Diagnosis: Lymphedema, not elsewhere classified  Physical deconditioning  Low back pain with left-sided sciatica, unspecified back pain laterality, unspecified chronicity  Pain in right arm  Risk for falls  Stiffness of right shoulder, not elsewhere classified     Problem List Patient Active Problem List   Diagnosis Date Noted  . Lymphedema 09/17/2015  . Long term current use of anticoagulant therapy 08/23/2015  . DVT, lower extremity ([place]) 06/18/2015  . Bilateral knee pain 04/03/2015  . Arm edema 08/28/2014  . Vitamin D deficiency 04/23/2014  . Hyperparathyroidism ([place]) 04/23/2014  . Depression 07/18/2013  . Malignant neoplasm of upper-outer quadrant of right breast in female, estrogen receptor positive ([place]) 07/15/2013  . Encounter for therapeutic drug monitoring 07/05/2013  . Overactive bladder 01/30/2013  . Primary  hypercoagulable state ([place]) 12/19/2012  . HTN (hypertension) 09/27/2011  . Hearing loss 09/27/2011  . Chest wall pain 09/27/2011  . Seasonal allergies 09/27/2011  . SOB (shortness of breath), related to deconditioning 08/26/2011  . Hypothyroid 08/26/2011   [name]. Owens Shark PT  [name] 11/29/2016, 5:41 PM  [place], [name], [address] Phone: [phone]   Fax:  [phone]  Name: Alicia Vasquez MRN: [id number] Date of Birth: 02-02-1941

## 2016-12-01 ENCOUNTER — Ambulatory Visit: Payer: Medicare Other | Admitting: Physical Therapy

## 2016-12-01 ENCOUNTER — Encounter: Payer: Medicare Other | Admitting: Physical Therapy

## 2016-12-01 DIAGNOSIS — M5442 Lumbago with sciatica, left side: Secondary | ICD-10-CM

## 2016-12-01 DIAGNOSIS — M25611 Stiffness of right shoulder, not elsewhere classified: Secondary | ICD-10-CM | POA: Diagnosis not present

## 2016-12-01 DIAGNOSIS — Z9181 History of falling: Secondary | ICD-10-CM

## 2016-12-01 DIAGNOSIS — R5381 Other malaise: Secondary | ICD-10-CM

## 2016-12-01 DIAGNOSIS — M79601 Pain in right arm: Secondary | ICD-10-CM | POA: Diagnosis not present

## 2016-12-01 DIAGNOSIS — I890 Lymphedema, not elsewhere classified: Secondary | ICD-10-CM | POA: Diagnosis not present

## 2016-12-01 NOTE — Therapy (Signed)
[place], [name], [address] Phone: [phone]   Fax:  [phone]  Physical Therapy Treatment  Patient Details  Name: Alicia Vasquez MRN: [id number] Date of Birth: 1941/04/16 Referring Provider: Tivis Ringer   Encounter Date: 12/01/2016      PT End of Session - 12/01/16 1505    Visit Number 15   Number of Visits 20   Date for PT Re-Evaluation 12/23/16   PT Start Time 1435   PT Stop Time 1515   PT Time Calculation (min) 40 min   Activity Tolerance Patient tolerated treatment well      Past Medical History:  Diagnosis Date  . Allergy   . Anxiety   . Arthritis   . Blood transfusion without reported diagnosis   . Breast cancer ([place]) 07/12/13   invasive mammary carcinoma  . DVT (deep vein thrombosis) in pregnancy ([place])   . Hypertension   . Hypothyroid   . Personal history of radiation therapy   . Pneumonia   . Radiation 11/21/13-01/07/14   Right Breast/supraclav.    Past Surgical History:  Procedure Laterality Date  . biospy     female organs  . BREAST LUMPECTOMY Right 08/30/2013  . BREAST LUMPECTOMY WITH RADIOACTIVE SEED LOCALIZATION Right 09/09/2013   Procedure: BREAST LUMPECTOMY WITH RADIOACTIVE SEED LOCALIZATION WITH AXILLARY NODE EXCISION;  Surgeon: [name], MD;  Location: [facility];  Service: General;  Laterality: Right;  . DILATION AND CURETTAGE OF UTERUS    . RE-EXCISION OF BREAST LUMPECTOMY Right 09/24/2013   Procedure: RE-EXCISION OF RIGHT BREAST LUMPECTOMY;  Surgeon: [name], MD;  Location: [facility];  Service: General;  Laterality: Right;  . removal of teeth  04/19/2012   13 teeth removed    There were no vitals filed for this visit.      Subjective Assessment - 12/01/16 1436    Subjective pt states she still is having pain especially in her left hip and numbness in her left foot.    Pertinent History breast  cancer in 2015 with lumpectomy and 4 sentinel nodes with second surgery to get clear margins.  She underwent  radiation.  Multiple complaints of pain in back, left legs neck right  shoulder and arm. She had a near fall in December and twisted her back and caught herself with right arm and has pain since. Pt reports she has not followed up with community exercise program   Patient Stated Goals get some relief from the pain in her right axilla    Currently in Pain? Yes   Pain Score 7   even after tylenol    Pain Location Groin  and ankle    Pain Orientation Left   Pain Descriptors / Indicators Stabbing;Sharp   Pain Type Chronic pain                         [facility] Adult PT Treatment/Exercise - 12/01/16 0001      Knee/Hip Exercises: Standing   Heel Raises Both;5 reps   Other Standing Knee Exercises standing hip flexor stretch with increased pain down left leg with stretch to left hip flexor     Moist Heat Therapy   Number Minutes Moist Heat 15 Minutes   Moist Heat Location Lumbar Spine     Manual Therapy   Manual Therapy Soft tissue mobilization;Passive ROM   Soft tissue mobilization with biotone, to low back  and right  cervical area in right sidelying   deep pressure to left paraspinals and trigger points    Passive ROM stretching to left hip especially to hip flexors                         [facility] Goals - 11/15/16 2014      CC Long Term Goal  #1   Title Pt will report a 50% reduction in low back pain    Time 4   Status On-going     CC Long Term Goal  #2   Title Pt will be independent in an exercise program to help with low back pain    Time 4   Period Weeks   Status New     CC Long Term Goal  #3   Title Pt will have a decrease in Quick DASH to < 38 demonstrating functional improvment of right arm     Baseline 52.27 o 09/29/2016   Time 4   Period Weeks   Status On-going     CC Long Term Goal  #4   Title Pt will report a 50 %  decrease in right upper quadrant pain    Time 4   Period Weeks   Status On-going     CC Long Term Goal  #5   Title Pt will increase repetitions fo sit to stand in 30 seconds to 8 demonstrating and increase in generalized strength and function   Baseline 6 in 30 seconds limited by leg pain   Time 4   Period Weeks   Status On-going            Plan - 12/01/16 1505    Rehab Potential Good   Clinical Impairments Affecting Rehab Potential previous radiation, 4 nodes removed,  chronic pain limiting her activities    PT Frequency 2x / week   PT Duration 4 weeks   PT Next Visit Plan Continue moist heat and soft tissue work, Add lumbar extension and mobility  exercises, gait training progressing to cane and no device    Consulted and Agree with Plan of Care Patient   PT Plan Pt reported she had no pain immediately after moist heat and soft tissue work while she was in sidelying.  Pt states overall she is feeling better and is hopeful pain will be gone by next week. She has appt with Dr. Juleen China on Monday and sees endocrinologist on Tuesday       Patient will benefit from skilled therapeutic intervention in order to improve the following deficits and impairments:  Obesity, Increased edema, Increased fascial restricitons, Impaired UE functional use, Pain, Decreased mobility, Difficulty walking, Increased muscle spasms, Decreased range of motion, Impaired perceived functional ability, Postural dysfunction, Impaired flexibility  Visit Diagnosis: Physical deconditioning  Low back pain with left-sided sciatica, unspecified back pain laterality, unspecified chronicity  Pain in right arm  Risk for falls     Problem List Patient Active Problem List   Diagnosis Date Noted  . Lymphedema 09/17/2015  . Long term current use of anticoagulant therapy 08/23/2015  . DVT, lower extremity ([place]) 06/18/2015  . Bilateral knee pain 04/03/2015  . Arm edema 08/28/2014  . Vitamin D deficiency  04/23/2014  . Hyperparathyroidism ([place]) 04/23/2014  . Depression 07/18/2013  . Malignant neoplasm of upper-outer quadrant of right breast in female, estrogen receptor positive ([place]) 07/15/2013  . Encounter for therapeutic drug monitoring 07/05/2013  . Overactive bladder 01/30/2013  . Primary  hypercoagulable state ([place]) 12/19/2012  . HTN (hypertension) 09/27/2011  . Hearing loss 09/27/2011  . Chest wall pain 09/27/2011  . Seasonal allergies 09/27/2011  . SOB (shortness of breath), related to deconditioning 08/26/2011  . Hypothyroid 08/26/2011   [name]. Owens Shark PT  [name] 12/01/2016, 4:07 PM  [place], [name], [address] Phone: [phone]   Fax:  [phone]  Name: Alicia Vasquez MRN: [id number] Date of Birth: 1941/02/07

## 2016-12-05 ENCOUNTER — Ambulatory Visit (INDEPENDENT_AMBULATORY_CARE_PROVIDER_SITE_OTHER): Payer: Medicare Other | Admitting: Family Medicine

## 2016-12-05 ENCOUNTER — Encounter: Payer: Self-pay | Admitting: Family Medicine

## 2016-12-05 VITALS — BP 136/78 | HR 80 | Temp 97.7°F | Ht 64.0 in | Wt 213.2 lb

## 2016-12-05 DIAGNOSIS — D6859 Other primary thrombophilia: Secondary | ICD-10-CM

## 2016-12-05 DIAGNOSIS — G8929 Other chronic pain: Secondary | ICD-10-CM | POA: Diagnosis not present

## 2016-12-05 DIAGNOSIS — E039 Hypothyroidism, unspecified: Secondary | ICD-10-CM

## 2016-12-05 DIAGNOSIS — M5442 Lumbago with sciatica, left side: Secondary | ICD-10-CM

## 2016-12-05 MED ORDER — DICLOFENAC SODIUM 2 % TD SOLN: 1.0000 "application " | Freq: Every day | TRANSDERMAL | 0 refills | Status: DC

## 2016-12-05 NOTE — Progress Notes (Signed)
Alicia Vasquez is a 76 y.o. female is here to [employer].   Patient Care Team: Briscoe Deutscher, DO as PCP - General (Family Medicine) Philemon Kingdom, MD as Consulting Physician (Internal Medicine)   History of Present Illness:   Alicia Vasquez, CMA, acting as scribe for Dr. Juleen China.  HPI:  Patient states she takes a blood thinner due to history of DVT.  Also has back pain.  States she was told she has a pinched nerve in her back.  Was tried on prednisone but states that didn't help.  She has been eating Tylenol "like candy".  Also takes hydrocodone for pain.  Pain is located in the lower back on the left side.  Radiates down the left leg.  She has some numbness in her left foot at times.  Ambulating with a walker.    See Assessment and Plan section for Problem Based Charting of further issues discussed today.  Health Maintenance Due  Topic Date Due  . TETANUS/TDAP  07/14/1958  . PNA vac Low Risk Adult (2 of 2 - PCV13) 07/20/2010   Immunization History  Administered Date(s) Administered  . Pneumococcal-Unspecified 07/20/2009   PMHx, SurgHx, SocialHx, Medications, and Allergies were reviewed in the Visit Navigator and updated as appropriate.   Past Medical History:  Diagnosis Date  . Allergy   . Anxiety   . Arthritis   . Blood transfusion without reported diagnosis   . Breast cancer ([place]) 07/12/2013   Invasive Mammary Carcinoma  . DVT (deep vein thrombosis) in pregnancy ([place])   . Hypertension   . Hypothyroid   . Personal history of radiation therapy   . Pneumonia   . Radiation 11/21/13-01/07/14   Right Breast/Supraclavicular   Past Surgical History:  Procedure Laterality Date  . BREAST LUMPECTOMY WITH RADIOACTIVE SEED LOCALIZATION Right 09/09/2013   Procedure: BREAST LUMPECTOMY WITH RADIOACTIVE SEED LOCALIZATION WITH AXILLARY NODE EXCISION;  Surgeon: [name], MD;  Location: [facility];  Service: General;  Laterality: Right;  . DENTAL SURGERY   04/19/2012   13 TEETH REMOVED  . DILATION AND CURETTAGE OF UTERUS    . RE-EXCISION OF BREAST LUMPECTOMY Right 09/24/2013   Procedure: RE-EXCISION OF RIGHT BREAST LUMPECTOMY;  Surgeon: [name], MD;  Location: [facility];  Service: General;  Laterality: Right;   Family History  Problem Relation Age of Onset  . Heart disease Brother   . Colon cancer Brother   . Prostate cancer Brother    Social History  Substance Use Topics  . Smoking status: Never Smoker  . Smokeless tobacco: Never Used  . Alcohol use No   Current Medications and Allergies:    .  acetaminophen (TYLENOL) 500 MG tablet, Take 500 mg by mouth every 6 (six) hours as needed for mild pain, fever or headache. Reported on 09/08/2015, Disp: , Rfl:  .  Cholecalciferol (VITAMIN D3) 5000 UNITS TABS, Take by mouth., Disp: , Rfl:  .  HYDROcodone-acetaminophen (NORCO) 5-325 MG tablet, Take 1 tablet by mouth every 6 (six) hours as needed for moderate pain., Disp: 12 tablet, Rfl: 0 .  levothyroxine (SYNTHROID) 125 MCG tablet, Take 1 tablet (125 mcg total) by mouth daily before breakfast. Dispense as written: Synthroid LAST REFILL, Disp: 90 tablet, Rfl: 3 .  lisinopril (PRINIVIL,ZESTRIL) 10 MG tablet, TAKE 1 TABLET (10 MG TOTAL) BY MOUTH DAILY., Disp: 90 tablet, Rfl: 1 .  warfarin (JANTOVEN) 5 MG tablet, TAKE AS DIRECTED BY ANTICOAGULATION CLINIC, Disp: 60 tablet, Rfl: 3  Allergies  Allergen Reactions  . Anesthetics, Amide Hypertension  . Benadryl [Diphenhydramine Hcl] Other (See Comments)    Dizziness  . Carbocaine [Mepivacaine Hcl] Hypertension  . Codeine Other (See Comments)    Dizziness  . Epinephrine Hypertension  . Sulfa Antibiotics Other (See Comments)    Dizziness  . Penicillins Rash   Review of Systems:   Review of Systems  Constitutional: Negative for chills, fever, malaise/fatigue and weight loss.  Respiratory: Negative for cough, shortness of breath and wheezing.   Cardiovascular: Negative  for chest pain, palpitations and leg swelling.  Gastrointestinal: Negative for abdominal pain, constipation, diarrhea, nausea and vomiting.  Genitourinary: Negative for dysuria and urgency.  Musculoskeletal: Positive for joint pain and myalgias.  Skin: Negative for rash.  Neurological: Negative for dizziness and headaches.  Psychiatric/Behavioral: Negative for depression, substance abuse and suicidal ideas. The patient is not nervous/anxious.    Vitals:   Vitals:   12/05/16 1500  BP: 136/78  Pulse: 80  Temp: 97.7 F (36.5 C)  TempSrc: Oral  SpO2: 98%  Weight: 213 lb 3.2 oz (96.7 kg)  Height: 5\' 4"  (1.626 m)     Body mass index is 36.6 kg/m.  Physical Exam:   Physical Exam  Constitutional: She appears well-developed and well-nourished. No distress.  HENT:  Head: Normocephalic and atraumatic.  Right Ear: External ear normal.  Left Ear: External ear normal.  Nose: Nose normal.  Mouth/Throat: Oropharynx is clear and moist.  Eyes: Pupils are equal, round, and reactive to light. EOM are normal.  Neck: Normal range of motion. Neck supple.  Cardiovascular: Normal rate, regular rhythm, normal heart sounds and intact distal pulses.   Pulmonary/Chest: Effort normal.  Abdominal: Soft.  Musculoskeletal:       Lumbar back: She exhibits decreased range of motion, bony tenderness and spasm.  Neurological: She has normal strength.  Left + SLR.  Skin: Skin is warm.  Psychiatric: She has a normal mood and affect. Her behavior is normal.  Nursing note and vitals reviewed.  Results for orders placed or performed in visit on 12/05/16  CBC with Differential/Platelet  Result Value Ref Range   WBC 7.5 4.0 - 10.5 K/uL   RBC 4.41 3.87 - 5.11 Mil/uL   Hemoglobin 13.8 12.0 - 15.0 g/dL   HCT 40.8 36.0 - 46.0 %   MCV 92.4 78.0 - 100.0 fl   MCHC 33.8 30.0 - 36.0 g/dL   RDW 14.1 11.5 - 15.5 %   Platelets 234.0 150.0 - 400.0 K/uL   Neutrophils Relative % 60.9 43.0 - 77.0 %   Lymphocytes  Relative 20.3 12.0 - 46.0 %   Monocytes Relative 15.2 (H) 3.0 - 12.0 %   Eosinophils Relative 2.4 0.0 - 5.0 %   Basophils Relative 1.2 0.0 - 3.0 %   Neutro Abs 4.6 1.4 - 7.7 K/uL   Lymphs Abs 1.5 0.7 - 4.0 K/uL   Monocytes Absolute 1.1 (H) 0.1 - 1.0 K/uL   Eosinophils Absolute 0.2 0.0 - 0.7 K/uL   Basophils Absolute 0.1 0.0 - 0.1 K/uL  Comprehensive metabolic panel  Result Value Ref Range   Sodium 140 135 - 145 mEq/L   Potassium 4.7 3.5 - 5.1 mEq/L   Chloride 104 96 - 112 mEq/L   CO2 28 19 - 32 mEq/L   Glucose, Bld 104 (H) 70 - 99 mg/dL   BUN 23 6 - 23 mg/dL   Creatinine, Ser 0.97 0.40 - 1.20 mg/dL   Total Bilirubin 0.4 0.2 - 1.2  mg/dL   Alkaline Phosphatase 86 39 - 117 U/L   AST 16 0 - 37 U/L   ALT 16 0 - 35 U/L   Total Protein 6.7 6.0 - 8.3 g/dL   Albumin 4.4 3.5 - 5.2 g/dL   Calcium 11.1 (H) 8.4 - 10.5 mg/dL   GFR 59.13 (L) >60.00 mL/min  TSH  Result Value Ref Range   TSH 1.31 0.35 - 4.50 uIU/mL  T4, free  Result Value Ref Range   Free T4 1.10 0.60 - 1.60 ng/dL   Assessment and Plan:   Alicia Vasquez was seen today for establish care.  Diagnoses and all orders for this visit:  Chronic left-sided low back pain with left-sided sciatica Comments: Worsening. No prior MRI. Will go ahead and order for further evaluation. PENNSAID provided as sample today as much of her pain seems to be concentrated at the left SI joint. Orders: -     CBC with Differential/Platelet -     Comprehensive metabolic panel -     MR Lumbar Spine Wo Contrast; Future -     Diclofenac Sodium (PENNSAID) 2 % SOLN; Place 1 application onto the skin daily.  Acquired hypothyroidism Comments: Labs today. Orders: -     TSH -     T4, free  Primary hypercoagulable state ([place]) Comments: Continue current treatment.   . Reviewed expectations re: course of current medical issues. . Discussed self-management of symptoms. . Outlined signs and symptoms indicating need for more acute intervention. . Patient  verbalized understanding and all questions were answered. Marland Kitchen Health Maintenance issues including appropriate healthy diet, exercise, and smoking avoidance were discussed with patient. . See orders for this visit as documented in the electronic medical record. . Patient received an After Visit Summary.  CMA served as Education administrator during this visit. History, Physical, and Plan performed by medical provider. The above documentation has been reviewed and is accurate and complete. Briscoe Deutscher, D.O.  Records requested if needed. Time spent with the patient: 45 minutes, of which >50% was spent in obtaining information about her symptoms, reviewing her previous labs, evaluations, and treatments, counseling her about her condition (please see the discussed topics above), and developing a plan to further investigate it; she had a number of questions which I addressed.   Briscoe Deutscher, DO [place] [place] 12/10/2016  Future Appointments Date Time Provider [place]  12/13/2016 3:15 PM Kipp Laurence, PT [facility] None  12/15/2016 2:30 PM [facility] LAB 4 [facility] None  12/15/2016 3:15 PM Kipp Laurence, PT [facility] None  12/20/2016 2:30 PM Kipp Laurence, PT [facility] None  12/22/2016 2:30 PM Kipp Laurence, PT [facility] None  12/27/2016 2:30 PM Kipp Laurence, PT [facility] None  12/30/2016 11:00 AM Kipp Laurence, PT [facility] None  01/12/2017 2:30 PM [facility] LAB 1 [facility] None  01/19/2017 2:15 PM Magrinat, [name], MD [facility] None  03/07/2017 2:15 PM Briscoe Deutscher, DO [facility] None  06/08/2017 3:00 PM Philemon Kingdom, MD [facility] None

## 2016-12-06 ENCOUNTER — Ambulatory Visit (INDEPENDENT_AMBULATORY_CARE_PROVIDER_SITE_OTHER): Payer: Medicare Other | Admitting: Internal Medicine

## 2016-12-06 ENCOUNTER — Ambulatory Visit: Payer: Medicare Other | Admitting: Physical Therapy

## 2016-12-06 ENCOUNTER — Encounter: Payer: Self-pay | Admitting: Internal Medicine

## 2016-12-06 VITALS — BP 128/70 | HR 80 | Wt 212.0 lb

## 2016-12-06 DIAGNOSIS — M25611 Stiffness of right shoulder, not elsewhere classified: Secondary | ICD-10-CM | POA: Diagnosis not present

## 2016-12-06 DIAGNOSIS — E038 Other specified hypothyroidism: Secondary | ICD-10-CM | POA: Diagnosis not present

## 2016-12-06 DIAGNOSIS — I890 Lymphedema, not elsewhere classified: Secondary | ICD-10-CM | POA: Diagnosis not present

## 2016-12-06 DIAGNOSIS — E213 Hyperparathyroidism, unspecified: Secondary | ICD-10-CM

## 2016-12-06 DIAGNOSIS — E559 Vitamin D deficiency, unspecified: Secondary | ICD-10-CM

## 2016-12-06 DIAGNOSIS — R5381 Other malaise: Secondary | ICD-10-CM | POA: Diagnosis not present

## 2016-12-06 DIAGNOSIS — M79601 Pain in right arm: Secondary | ICD-10-CM

## 2016-12-06 DIAGNOSIS — M5442 Lumbago with sciatica, left side: Secondary | ICD-10-CM | POA: Diagnosis not present

## 2016-12-06 DIAGNOSIS — Z9181 History of falling: Secondary | ICD-10-CM

## 2016-12-06 LAB — COMPREHENSIVE METABOLIC PANEL
ALT: 16 U/L (ref 0–35)
AST: 16 U/L (ref 0–37)
Albumin: 4.4 g/dL (ref 3.5–5.2)
Alkaline Phosphatase: 86 U/L (ref 39–117)
BUN: 23 mg/dL (ref 6–23)
CO2: 28 mEq/L (ref 19–32)
Calcium: 11.1 mg/dL — ABNORMAL HIGH (ref 8.4–10.5)
Chloride: 104 mEq/L (ref 96–112)
Creatinine, Ser: 0.97 mg/dL (ref 0.40–1.20)
GFR: 59.13 mL/min — ABNORMAL LOW (ref >60.00)
Glucose, Bld: 104 mg/dL — ABNORMAL HIGH (ref 70–99)
Potassium: 4.7 mEq/L (ref 3.5–5.1)
Sodium: 140 mEq/L (ref 135–145)
Total Bilirubin: 0.4 mg/dL (ref 0.2–1.2)
Total Protein: 6.7 g/dL (ref 6.0–8.3)

## 2016-12-06 LAB — CBC WITH DIFFERENTIAL/PLATELET
Basophils Absolute: 0.1 10*3/uL (ref 0.0–0.1)
Basophils Relative: 1.2 % (ref 0.0–3.0)
Eosinophils Absolute: 0.2 10*3/uL (ref 0.0–0.7)
Eosinophils Relative: 2.4 % (ref 0.0–5.0)
HCT: 40.8 % (ref 36.0–46.0)
Hemoglobin: 13.8 g/dL (ref 12.0–15.0)
Lymphocytes Relative: 20.3 % (ref 12.0–46.0)
Lymphs Abs: 1.5 10*3/uL (ref 0.7–4.0)
MCHC: 33.8 g/dL (ref 30.0–36.0)
MCV: 92.4 fl (ref 78.0–100.0)
Monocytes Absolute: 1.1 10*3/uL — ABNORMAL HIGH (ref 0.1–1.0)
Monocytes Relative: 15.2 % — ABNORMAL HIGH (ref 3.0–12.0)
Neutro Abs: 4.6 10*3/uL (ref 1.4–7.7)
Neutrophils Relative %: 60.9 % (ref 43.0–77.0)
Platelets: 234 10*3/uL (ref 150.0–400.0)
RBC: 4.41 Mil/uL (ref 3.87–5.11)
RDW: 14.1 % (ref 11.5–15.5)
WBC: 7.5 10*3/uL (ref 4.0–10.5)

## 2016-12-06 LAB — T4, FREE: Free T4: 1.1 ng/dL (ref 0.60–1.60)

## 2016-12-06 LAB — VITAMIN D 25 HYDROXY (VIT D DEFICIENCY, FRACTURES): VITD: 45.98 ng/mL (ref 30.00–100.00)

## 2016-12-06 LAB — TSH: TSH: 1.31 u[IU]/mL (ref 0.35–4.50)

## 2016-12-06 NOTE — Therapy (Signed)
[place], [name], [address] Phone: [phone]   Fax:  [phone]  Physical Therapy Treatment  Patient Details  Name: Alicia Vasquez MRN: [id number] Date of Birth: 1940/11/12 Referring Provider: Tivis Ringer   Encounter Date: 12/06/2016      PT End of Session - 12/06/16 1642    Visit Number 16   Number of Visits 20   Date for PT Re-Evaluation 12/23/16   PT Start Time 4431   PT Stop Time 1632   PT Time Calculation (min) 47 min   Activity Tolerance Patient tolerated treatment well   Behavior During Therapy [facility] for tasks assessed/performed      Past Medical History:  Diagnosis Date  . Allergy   . Anxiety   . Arthritis   . Blood transfusion without reported diagnosis   . Breast cancer ([place]) 07/12/13   invasive mammary carcinoma  . DVT (deep vein thrombosis) in pregnancy ([place])   . Hypertension   . Hypothyroid   . Personal history of radiation therapy   . Pneumonia   . Radiation 11/21/13-01/07/14   Right Breast/supraclav.    Past Surgical History:  Procedure Laterality Date  . biospy     female organs  . BREAST LUMPECTOMY Right 08/30/2013  . BREAST LUMPECTOMY WITH RADIOACTIVE SEED LOCALIZATION Right 09/09/2013   Procedure: BREAST LUMPECTOMY WITH RADIOACTIVE SEED LOCALIZATION WITH AXILLARY NODE EXCISION;  Surgeon: [name], MD;  Location: [facility];  Service: General;  Laterality: Right;  . DILATION AND CURETTAGE OF UTERUS    . RE-EXCISION OF BREAST LUMPECTOMY Right 09/24/2013   Procedure: RE-EXCISION OF RIGHT BREAST LUMPECTOMY;  Surgeon: [name], MD;  Location: [place];  Service: General;  Laterality: Right;  . removal of teeth  04/19/2012   13 teeth removed    There were no vitals filed for this visit.      Subjective Assessment - 12/06/16 1555    Subjective Pt saw Dr. Juleen China on Monday and got some medicine to put on her  knee and she will have a MRI to her back. She will wait until she get those results before she calls Raliegh Ip.  She saw her endocrinologist today and found out her calcium levels are high and she may have some problems with her parathyroid.    Pertinent History breast cancer in 2015 with lumpectomy and 4 sentinel nodes with second surgery to get clear margins.  She underwent  radiation.  Multiple complaints of pain in back, left legs neck right  shoulder and arm. She had a near fall in December and twisted her back and caught herself with right arm and has pain since. Pt reports she has not followed up with community exercise program   Patient Stated Goals get some relief from the pain in her right axilla    Currently in Pain? Yes   Pain Score 4    Pain Location Groin   Pain Orientation Left   Pain Descriptors / Indicators Stabbing;Sharp   Pain Type Chronic pain   Pain Radiating Towards to ankle    Pain Onset More than a month ago   Aggravating Factors  increases with walking.    Pain Relieving Factors tylenol                          [facility] Adult PT Treatment/Exercise - 12/06/16 0001      Ambulation/Gait   Gait Comments  dowel rod in each hand for 4 point gait to work toward weaing off walker, raised walker one notch to promote more upright posture      Knee/Hip Exercises: Standing   Forward Lunges Both;5 reps  pt c/o pain in left hip    Other Standing Knee Exercises forward sway with belly to countertop , the hips backward     Other Standing Knee Exercises glute sets      Moist Heat Therapy   Number Minutes Moist Heat 20 Minutes   Moist Heat Location Lumbar Spine     Manual Therapy   Manual Therapy Soft tissue mobilization   Soft tissue mobilization with biotone, to low back  and right cervical area in right sidelying   deep pressure to left paraspinals and trigger points                         [facility] Goals - 11/15/16 2014       CC Long Term Goal  #1   Title Pt will report a 50% reduction in low back pain    Time 4   Status On-going     CC Long Term Goal  #2   Title Pt will be independent in an exercise program to help with low back pain    Time 4   Period Weeks   Status New     CC Long Term Goal  #3   Title Pt will have a decrease in Quick DASH to < 38 demonstrating functional improvment of right arm     Baseline 52.27 o 09/29/2016   Time 4   Period Weeks   Status On-going     CC Long Term Goal  #4   Title Pt will report a 50 % decrease in right upper quadrant pain    Time 4   Period Weeks   Status On-going     CC Long Term Goal  #5   Title Pt will increase repetitions fo sit to stand in 30 seconds to 8 demonstrating and increase in generalized strength and function   Baseline 6 in 30 seconds limited by leg pain   Time 4   Period Weeks   Status On-going            Plan - 12/06/16 1642    Clinical Impression Statement Pt looks better, appears to be walking better and states her pain is improved.  She is encouraged by recent doctor visits and hopeful she will improve.  She is not sure what to do about her high blood calcium level and parathyroid hormone    Rehab Potential Good   Clinical Impairments Affecting Rehab Potential previous radiation, 4 nodes removed,  chronic pain limiting her activities    PT Frequency 2x / week   PT Treatment/Interventions Manual techniques;ADLs/Self Care Home Management;Patient/family education;Orthotic Fit/Training;Taping;Electrical Stimulation;Therapeutic activities;Therapeutic exercise;Passive range of motion;Scar mobilization;Manual lymph drainage;Compression bandaging;Moist Heat;Neuromuscular re-education;Gait training;Stair training   PT Next Visit Plan Continue moist heat and soft tissue work, Add lumbar extension and mobility  exercises, gait training progressing to cane and no device    PT Plan Pt looks better, appears to be walking better and states her pain  is improved.  She is encouraged by recent doctor visits and hopeful she will improve.  She is not sure what to do about her high blood calcium level and parathyroid hormone       Patient will benefit from skilled therapeutic intervention in  order to improve the following deficits and impairments:  Obesity, Increased edema, Increased fascial restricitons, Impaired UE functional use, Pain, Decreased mobility, Difficulty walking, Increased muscle spasms, Decreased range of motion, Impaired perceived functional ability, Postural dysfunction, Impaired flexibility  Visit Diagnosis: Physical deconditioning  Low back pain with left-sided sciatica, unspecified back pain laterality, unspecified chronicity  Pain in right arm  Risk for falls     Problem List Patient Active Problem List   Diagnosis Date Noted  . Lymphedema 09/17/2015  . Long term current use of anticoagulant therapy 08/23/2015  . DVT, lower extremity ([place]) 06/18/2015  . Bilateral knee pain 04/03/2015  . Arm edema 08/28/2014  . Vitamin D deficiency 04/23/2014  . Hyperparathyroidism ([place]) 04/23/2014  . Depression 07/18/2013  . Malignant neoplasm of upper-outer quadrant of right breast in female, estrogen receptor positive ([place]) 07/15/2013  . Encounter for therapeutic drug monitoring 07/05/2013  . Overactive bladder 01/30/2013  . Primary hypercoagulable state ([place]) 12/19/2012  . HTN (hypertension) 09/27/2011  . Hearing loss 09/27/2011  . Chest wall pain 09/27/2011  . Seasonal allergies 09/27/2011  . SOB (shortness of breath), related to deconditioning 08/26/2011  . Hypothyroid 08/26/2011   [name]. Owens Shark PT  [name] 12/06/2016, 5:01 PM  [place], [name], [address] Phone: [phone]   Fax:  [phone]  Name: ELYZA WHITT MRN: [id number] Date of Birth: 10/09/1940

## 2016-12-06 NOTE — Progress Notes (Signed)
Patient ID: Alicia Vasquez, female   DOB: 05-30-1941, 76 y.o.   MRN: [id number]   HPI  Alicia Vasquez is a 76 y.o.-year-old female, returning for follow-up for hyperparathyroidism and severe vitamin D deficiency. Last visit 1 year ago.  Reviewed and addended hx: Pt was dx with found to have an elevated parathyroid hormone in 02/2014.   She also had high calcium levels >> highest 11.1 yesterday.  I reviewed pt's pertinent labs: Lab Results  Component Value Date   PTH 31 12/07/2015   PTH Comment 12/07/2015   PTH 37 06/09/2015   PTH Comment 06/09/2015   PTH 37 12/19/2014   PTH Comment 12/19/2014   PTH 88 (H) 07/27/2014   PTH 76 (H) 07/10/2014   PTH 75 (H) 06/07/2014   PTH 44 04/18/2014   PTH Comment 04/18/2014   CALCIUM 11.1 (H) 12/05/2016   CALCIUM 10.8 (H) 03/17/2016   CALCIUM 10.6 (H) 01/07/2016   CALCIUM 10.1 12/07/2015   CALCIUM 10.3 11/19/2015   CALCIUM 10.4 09/17/2015   CALCIUM 10.2 08/19/2015   CALCIUM 10.5 (H) 07/23/2015   CALCIUM 10.5 (H) 06/18/2015   CALCIUM 10.5 (H) 06/09/2015   Previous vitamin D was very low - PTH normalized after normalization of her vitamin D levels.  Pt was taking 1000 IU vitamin D in the past - a level was very low then >> we increased the dose to 5000 units daily >> vit D normalized. She takes this consistently.  Lab Results  Component Value Date   VD25OH 47.68 12/07/2015   VD25OH 45.83 06/09/2015   VD25OH 30.58 12/19/2014   VD25OH 26.82 (L) 10/17/2014   VD25OH 7.19 (L) 04/18/2014   Component     Latest Ref Rng 04/18/2014         3:04 PM  Vitamin D 1, 25 (OH) Total     18 - 72 pg/mL 55  Vitamin D3 1, 25 (OH)      55  Vitamin D2 1, 25 (OH)      <8  Magnesium     1.5 - 2.5 mg/dL 2.1  Phosphorus     2.3 - 4.6 mg/dL 2.8   Reviewed  previous DEXA scans: - 09/03/2014: no osteoporosis/but osteopenia at LFN: -1.6 - 08/2016: Osteopenia RFN: -2.2, LFN: -2.1; R radius 33% distal: -0.2  She had 1 fracture: - 1994: R foot  -  1990's: L hairline fracture   No h/o kidney stones.  No h/o CKD. Last BUN/Cr: Lab Results  Component Value Date   BUN 23 12/05/2016   CREATININE 0.97 12/05/2016   She also has a history of breast cancer and is on Arimidex. She had RxTx. No ChTx. She also had a L leg in 2013.   Hypothyroidism: Pt is on levothyroxine 125 mcg daily, taken: - in am - fasting - at least 30 min from b'fast - no Ca, Fe, MVI, PPIs - not on Biotin  Last TSH: Lab Results  Component Value Date   TSH 1.31 12/05/2016    ROS: Constitutional: no weight gain/no weight loss, no fatigue, no subjective hyperthermia, no subjective hypothermia Eyes: no blurry vision, no xerophthalmia ENT: no sore throat, no nodules palpated in throat, no dysphagia, no odynophagia, no hoarseness Cardiovascular: no CP/no SOB/no palpitations/no leg swelling Respiratory: no cough/no SOB/no wheezing Gastrointestinal: no N/no V/no D/no C/no acid reflux Musculoskeletal: no muscle aches/+ joint aches Skin: no rashes, no hair loss Neurological: no tremors/no numbness/no tingling/no dizziness  I reviewed pt's medications, allergies, PMH, social hx,  family hx, and changes were documented in the history of present illness. Otherwise, unchanged from my initial visit note.   Past Medical History:  Diagnosis Date  . Allergy   . Anxiety   . Arthritis   . Blood transfusion without reported diagnosis   . Breast cancer ([place]) 07/12/13   invasive mammary carcinoma  . DVT (deep vein thrombosis) in pregnancy ([place])   . Hypertension   . Hypothyroid   . Personal history of radiation therapy   . Pneumonia   . Radiation 11/21/13-01/07/14   Right Breast/supraclav.   Past Surgical History:  Procedure Laterality Date  . biospy     female organs  . BREAST LUMPECTOMY Right 08/30/2013  . BREAST LUMPECTOMY WITH RADIOACTIVE SEED LOCALIZATION Right 09/09/2013   Procedure: BREAST LUMPECTOMY WITH RADIOACTIVE SEED LOCALIZATION WITH AXILLARY NODE  EXCISION;  Surgeon: [name], MD;  Location: [facility];  Service: General;  Laterality: Right;  . DILATION AND CURETTAGE OF UTERUS    . RE-EXCISION OF BREAST LUMPECTOMY Right 09/24/2013   Procedure: RE-EXCISION OF RIGHT BREAST LUMPECTOMY;  Surgeon: [name], MD;  Location: [facility];  Service: General;  Laterality: Right;  . removal of teeth  04/19/2012   13 teeth removed   History   Social History  . Marital Status: Widowed    Spouse Name: N/A    Number of Children: 1   Occupational History  . Retired Radio broadcast assistant   Social History Main Topics  . Smoking status: Never Smoker   . Smokeless tobacco: Not on file  . Alcohol Use: No  . Drug Use: No   Social History Narrative   Exercise yard work   Current Outpatient Prescriptions on File Prior to Visit  Medication Sig Dispense Refill  . acetaminophen (TYLENOL) 500 MG tablet Take 500 mg by mouth every 6 (six) hours as needed for mild pain, fever or headache. Reported on 09/08/2015    . Cholecalciferol (VITAMIN D3) 5000 UNITS TABS Take by mouth.    . Diclofenac Sodium (PENNSAID) 2 % SOLN Place 1 application onto the skin daily. 8 g 0  . HYDROcodone-acetaminophen (NORCO) 5-325 MG tablet Take 1 tablet by mouth every 6 (six) hours as needed for moderate pain. 12 tablet 0  . levothyroxine (SYNTHROID) 125 MCG tablet Take 1 tablet (125 mcg total) by mouth daily before breakfast. Dispense as written: Synthroid LAST REFILL 90 tablet 3  . lisinopril (PRINIVIL,ZESTRIL) 10 MG tablet TAKE 1 TABLET (10 MG TOTAL) BY MOUTH DAILY. 90 tablet 1  . warfarin (JANTOVEN) 5 MG tablet TAKE AS DIRECTED BY ANTICOAGULATION CLINIC 60 tablet 3   No current facility-administered medications on file prior to visit.    Allergies  Allergen Reactions  . Anesthetics, Amide Other (See Comments)    ELEVATE BLOOD PRESSURE  . Benadryl [Diphenhydramine Hcl]     dizziness  . Carbocaine [Mepivacaine Hcl] Other (See Comments)     Elevated blood pressure  . Codeine Nausea Only and Other (See Comments)    dizziness  . Epinephrine Other (See Comments)    Elevated blood pressure  . Sulfa Antibiotics Other (See Comments)    dizziness  . Penicillins Rash   Family History  Problem Relation Age of Onset  . Cancer Brother        prostate  . Heart disease Brother   . Colon cancer Brother    PE: BP 128/70 (BP Location: Left Arm, Patient Position: Sitting)   Pulse 80   Wt 212 lb (  96.2 kg)   SpO2 97%   BMI 36.39 kg/m  Body mass index is 36.39 kg/m. Wt Readings from Last 3 Encounters:  12/06/16 212 lb (96.2 kg)  12/05/16 213 lb 3.2 oz (96.7 kg)  09/22/16 210 lb 14.4 oz (95.7 kg)   Constitutional: overweight, in NAD Eyes: PERRLA, EOMI, no exophthalmos ENT: moist mucous membranes, no thyromegaly, no cervical lymphadenopathy Cardiovascular: RRR, No MRG Respiratory: CTA B Gastrointestinal: abdomen soft, NT, ND, BS+ Musculoskeletal: no deformities, strength intact in all 4 Skin: moist, warm, no rashes Neurological: no tremor with outstretched hands, DTR normal in all 4  Assessment: 1. Normocalcemic hyperparathyroidism  2. H/o Vitamin D deficiency - Severe    3. hypothyroidism  Plan: 1. Hyperparathyroidism  Patient has had mostly normal calcium levels in the past, however, she has had some slightly higher values since last visit, with the highest being yesterday, 11.1. Initially, an intact PTH level was also high, but normalized after her vitamin D also normalized on 5000 units daily. She continues on this dose.  -  she does not have a history of nephrolithiasis, no osteoporosis (however her bone density has worsened in the last 2 years). Of note, a 33% radial BMD was normal and higher than the rest of the scores. This is the site most affected by hyperparathyroidism.   - In the past, we decided to only follow her normal/slightly elevated calcium,  if he starts trending up, we do need to intervene. - will  check: Orders Placed This Encounter  Procedures  . PTH, intact and calcium  . VITAMIN D 25 Hydroxy (Vit-D Deficiency, Fractures)  . Calcium, urine, 24 hour  . Creatinine, urine, 24 hour  - I will see the patient back 6 months, or sooner, depending on further   2. H/o Severe vitamin D deficiency - last vit D level normal - continues on 5000 units of vitamin D daily  - will check vit D today   3. Hypothyroidism - latest thyroid labs reviewed with pt >> normal  - she continues on LT4 125 mcg daily - pt feels good on this dose. - we discussed about taking the thyroid hormone every day, with water, >30 minutes before breakfast, separated by >4 hours from acid reflux medications, calcium, iron, multivitamins. Pt. is taking it correctly - will check thyroid tests today: TSH and fT4 - If labs are abnormal, she will need to return for repeat TFTs in 1.5 months - OTW, RTC in 1 year  Office Visit on 12/06/2016  Component Date Value Ref Range Status  . Calcium 12/06/2016 10.6* 8.7 - 10.3 mg/dL Final  . PTH 12/06/2016 26  15 - 65 pg/mL Final  . PTH Interp 12/06/2016 Comment   Final   Comment: Interpretation                 Intact PTH    Calcium                                 (pg/mL)      (mg/dL) Normal                          15 - 65     8.6 - 10.2 Primary Hyperparathyroidism         >65          >10.2 Secondary Hyperparathyroidism       >65          <  10.2 Non-Parathyroid Hypercalcemia       <65          >10.2 Hypoparathyroidism                  <15          < 8.6 Non-Parathyroid Hypocalcemia    15 - 65          < 8.6   . VITD 12/06/2016 45.98  30.00 - 100.00 ng/mL Final   Calcium level is better, only slightly elevated. PTH is quite low in the normal range. Vitamin D level is great. She can start the 24-hour urine collection.  Philemon Kingdom, MD PhD [facility] Endocrinology

## 2016-12-06 NOTE — Patient Instructions (Addendum)
Please stop at the lab.  After the results are back, start collecting the 24 h urine.  Patient information (Up-to-Date): Collection of a 24-hour urine specimen  - You should collect every drop of urine during each 24-hour period. It does not matter how much or little urine is passed each time, as long as every drop is collected. - Begin the urine collection in the morning after you wake up, after you have emptied your bladder for the first time. - Urinate (empty the bladder) for the first time and flush it down the toilet. Note the exact time (eg, 6:15 AM). You will begin the urine collection at this time. - Collect every drop of urine during the day and night in an empty collection bottle. Store the bottle at room temperature or in the refrigerator. - If you need to have a bowel movement, any urine passed with the bowel movement should be collected. Try not to include feces with the urine collection. If feces does get mixed in, do not try to remove the feces from the urine collection bottle. - Finish by collecting the first urine passed the next morning, adding it to the collection bottle. This should be within ten minutes before or after the time of the first morning void on the first day (which was flushed). In this example, you would try to void between 6:05 and 6:25 on the second day. - If you need to urinate one hour before the final collection time, drink a full glass of water so that you can void again at the appropriate time. If you have to urinate 20 minutes before, try to hold the urine until the proper time. - Please note the exact time of the final collection, even if it is not the same time as when collection began on day 1. - The bottle(s) may be kept at room temperature for a day or two, but should be kept cool or refrigerated for longer periods of time.  Please come back for a follow-up appointment in 6 months.

## 2016-12-07 ENCOUNTER — Telehealth: Payer: Self-pay

## 2016-12-07 LAB — PTH, INTACT AND CALCIUM
Calcium: 10.6 mg/dL — ABNORMAL HIGH (ref 8.7–10.3)
PTH: 26 pg/mL (ref 15–65)

## 2016-12-07 NOTE — Telephone Encounter (Signed)
LVM, gave lab results. Gave call back number if any questions or concerns.  

## 2016-12-07 NOTE — Telephone Encounter (Signed)
Called and notified patient of the lab results. Patient understood, she was going to contact her PCP she believes she has a UTI and would want to get that taken care of before she does the 24 hour urine, she wanted you to be aware.

## 2016-12-07 NOTE — Telephone Encounter (Signed)
-----   Message from Philemon Kingdom, MD sent at 12/07/2016  1:03 PM EDT ----- Almyra Free, can you please call pt: Calcium level is better, only slightly elevated. PTH is quite low in the normal range. Vitamin D level is great. She can start the 24-hour urine collection.

## 2016-12-08 ENCOUNTER — Ambulatory Visit: Payer: Medicare Other | Admitting: Physical Therapy

## 2016-12-08 DIAGNOSIS — M79601 Pain in right arm: Secondary | ICD-10-CM | POA: Diagnosis not present

## 2016-12-08 DIAGNOSIS — Z9181 History of falling: Secondary | ICD-10-CM | POA: Diagnosis not present

## 2016-12-08 DIAGNOSIS — M25611 Stiffness of right shoulder, not elsewhere classified: Secondary | ICD-10-CM

## 2016-12-08 DIAGNOSIS — M5442 Lumbago with sciatica, left side: Secondary | ICD-10-CM

## 2016-12-08 DIAGNOSIS — R5381 Other malaise: Secondary | ICD-10-CM | POA: Diagnosis not present

## 2016-12-08 DIAGNOSIS — I890 Lymphedema, not elsewhere classified: Secondary | ICD-10-CM | POA: Diagnosis not present

## 2016-12-08 NOTE — Telephone Encounter (Signed)
That is a good idea 

## 2016-12-08 NOTE — Therapy (Signed)
[place], [name], [address] Phone: [phone]   Fax:  [phone]  Physical Therapy Treatment  Patient Details  Name: KYRIANA YANKEE MRN: [id number] Date of Birth: April 11, 1941 Referring Provider: Tivis Ringer   Encounter Date: 12/08/2016      PT End of Session - 12/08/16 1516    Visit Number 17   Number of Visits 20   Date for PT Re-Evaluation 12/23/16   PT Start Time 1430   PT Stop Time 1510   PT Time Calculation (min) 40 min   Activity Tolerance Patient tolerated treatment well   Behavior During Therapy [facility] for tasks assessed/performed      Past Medical History:  Diagnosis Date  . Allergy   . Anxiety   . Arthritis   . Blood transfusion without reported diagnosis   . Breast cancer ([place]) 07/12/13   invasive mammary carcinoma  . DVT (deep vein thrombosis) in pregnancy ([place])   . Hypertension   . Hypothyroid   . Personal history of radiation therapy   . Pneumonia   . Radiation 11/21/13-01/07/14   Right Breast/supraclav.    Past Surgical History:  Procedure Laterality Date  . biospy     female organs  . BREAST LUMPECTOMY Right 08/30/2013  . BREAST LUMPECTOMY WITH RADIOACTIVE SEED LOCALIZATION Right 09/09/2013   Procedure: BREAST LUMPECTOMY WITH RADIOACTIVE SEED LOCALIZATION WITH AXILLARY NODE EXCISION;  Surgeon: [name], MD;  Location: [place];  Service: General;  Laterality: Right;  . DILATION AND CURETTAGE OF UTERUS    . RE-EXCISION OF BREAST LUMPECTOMY Right 09/24/2013   Procedure: RE-EXCISION OF RIGHT BREAST LUMPECTOMY;  Surgeon: [name], MD;  Location: [place];  Service: General;  Laterality: Right;  . removal of teeth  04/19/2012   13 teeth removed    There were no vitals filed for this visit.      Subjective Assessment - 12/08/16 1433    Subjective pt comes in today saying "I'm hurting"  she says her left leg is  hurting real bad "Oh boy" she thinks she may have pulled it getting into bed last night    Pertinent History breast cancer in 2015 with lumpectomy and 4 sentinel nodes with second surgery to get clear margins.  She underwent  radiation.  Multiple complaints of pain in back, left legs neck right  shoulder and arm. She had a near fall in December and twisted her back and caught herself with right arm and has pain since. Pt reports she has not followed up with community exercise program   Patient Stated Goals get some relief from the pain    Currently in Pain? Yes   Pain Score 5    Pain Location Groin   Pain Orientation Left   Pain Descriptors / Indicators Sharp   Pain Type Chronic pain   Pain Radiating Towards to ankle    Pain Onset More than a month ago   Pain Frequency Intermittent   Aggravating Factors  when she moves    Pain Relieving Factors tylenol and heat    Effect of Pain on Daily Activities Pt has difficutly bearing weight on left leg                          [facility] Adult PT Treatment/Exercise - 12/08/16 0001      Ambulation/Gait   Pre-Gait Activities standing weight shift with feet shoulder width apart and  staggered stance x 5 minutes at start of treatment   pt reports pain in LLE      Moist Heat Therapy   Number Minutes Moist Heat 20 Minutes   Moist Heat Location Lumbar Spine     Manual Therapy   Manual Therapy Soft tissue mobilization   Manual therapy comments Pulling to both LE for traction through leg and to spine that pt felt was beneficial     Soft tissue mobilization with biotone, to low back  and right cervical area in right sidelying   deep pressure to left paraspinals and trigger points    Passive ROM stretching to left hip especially to hip flexors                         [facility] Goals - 11/15/16 2014      CC Long Term Goal  #1   Title Pt will report a 50% reduction in low back pain    Time 4   Status On-going      CC Long Term Goal  #2   Title Pt will be independent in an exercise program to help with low back pain    Time 4   Period Weeks   Status New     CC Long Term Goal  #3   Title Pt will have a decrease in Quick DASH to < 38 demonstrating functional improvment of right arm     Baseline 52.27 o 09/29/2016   Time 4   Period Weeks   Status On-going     CC Long Term Goal  #4   Title Pt will report a 50 % decrease in right upper quadrant pain    Time 4   Period Weeks   Status On-going     CC Long Term Goal  #5   Title Pt will increase repetitions fo sit to stand in 30 seconds to 8 demonstrating and increase in generalized strength and function   Baseline 6 in 30 seconds limited by leg pain   Time 4   Period Weeks   Status On-going            Plan - 12/08/16 1517    Clinical Impairments Affecting Rehab Potential previous radiation, 4 nodes removed,  chronic pain limiting her activities    PT Frequency 2x / week   PT Duration 4 weeks   PT Treatment/Interventions Manual techniques;ADLs/Self Care Home Management;Patient/family education;Orthotic Fit/Training;Taping;Electrical Stimulation;Therapeutic activities;Therapeutic exercise;Passive range of motion;Scar mobilization;Manual lymph drainage;Compression bandaging;Moist Heat;Neuromuscular re-education;Gait training;Stair training   PT Next Visit Plan Continue moist heat and soft tissue work, Add lumbar extension and mobility  exercises, gait training progressing to cane and no device    Consulted and Agree with Plan of Care Patient   PT Plan Pt arrives on time today, but c/o pain in left groing and ankle. She received much relief from pain with treatment today stating "Its not hurting much at all" as she was leaving Pt still walkking with RW and has difficulty moving let without holding on with hands       Patient will benefit from skilled therapeutic intervention in order to improve the following deficits and impairments:  Obesity,  Increased edema, Increased fascial restricitons, Impaired UE functional use, Pain, Decreased mobility, Difficulty walking, Increased muscle spasms, Decreased range of motion, Impaired perceived functional ability, Postural dysfunction, Impaired flexibility  Visit Diagnosis: Physical deconditioning  Low back pain with left-sided sciatica, unspecified back pain laterality,  unspecified chronicity  Pain in right arm  Risk for falls  Lymphedema, not elsewhere classified  Stiffness of right shoulder, not elsewhere classified     Problem List Patient Active Problem List   Diagnosis Date Noted  . Lymphedema 09/17/2015  . Long term current use of anticoagulant therapy 08/23/2015  . DVT, lower extremity ([place]) 06/18/2015  . Bilateral knee pain 04/03/2015  . Arm edema 08/28/2014  . Vitamin D deficiency 04/23/2014  . Hyperparathyroidism ([place]) 04/23/2014  . Depression 07/18/2013  . Malignant neoplasm of upper-outer quadrant of right breast in female, estrogen receptor positive ([place]) 07/15/2013  . Encounter for therapeutic drug monitoring 07/05/2013  . Overactive bladder 01/30/2013  . Primary hypercoagulable state ([place]) 12/19/2012  . HTN (hypertension) 09/27/2011  . Hearing loss 09/27/2011  . Chest wall pain 09/27/2011  . Seasonal allergies 09/27/2011  . SOB (shortness of breath), related to deconditioning 08/26/2011  . Hypothyroid 08/26/2011   [name]. Owens Shark PT  [name] 12/08/2016, 3:21 PM  [place], [name], [address] Phone: [phone]   Fax:  [phone]  Name: ALEXXA SABET MRN: [id number] Date of Birth: [birth year]

## 2016-12-08 NOTE — Telephone Encounter (Signed)
Noted. She was going to contact her PCP yesterday to get this taken care of and then would start the 24 hour urine.

## 2016-12-10 ENCOUNTER — Encounter: Payer: Self-pay | Admitting: Family Medicine

## 2016-12-13 ENCOUNTER — Ambulatory Visit: Payer: Medicare Other | Admitting: Physical Therapy

## 2016-12-13 DIAGNOSIS — M79601 Pain in right arm: Secondary | ICD-10-CM

## 2016-12-13 DIAGNOSIS — M5442 Lumbago with sciatica, left side: Secondary | ICD-10-CM

## 2016-12-13 DIAGNOSIS — Z9181 History of falling: Secondary | ICD-10-CM | POA: Diagnosis not present

## 2016-12-13 DIAGNOSIS — M25611 Stiffness of right shoulder, not elsewhere classified: Secondary | ICD-10-CM | POA: Diagnosis not present

## 2016-12-13 DIAGNOSIS — R5381 Other malaise: Secondary | ICD-10-CM | POA: Diagnosis not present

## 2016-12-13 DIAGNOSIS — I890 Lymphedema, not elsewhere classified: Secondary | ICD-10-CM | POA: Diagnosis not present

## 2016-12-13 NOTE — Therapy (Signed)
[place], [name], [address] Phone: [phone]   Fax:  [phone]  Physical Therapy Treatment  Patient Details  Name: Alicia Vasquez MRN: [id number] Date of Birth: 04-27-1941 Referring Provider: Tivis Ringer   Encounter Date: 12/13/2016      PT End of Session - 12/13/16 1735    Visit Number 18   Number of Visits 20   Date for PT Re-Evaluation 12/23/16   PT Start Time 1522   PT Stop Time 1600   PT Time Calculation (min) 38 min   Activity Tolerance Patient tolerated treatment well   Behavior During Therapy [facility] for tasks assessed/performed      Past Medical History:  Diagnosis Date  . Allergy   . Anxiety   . Arthritis   . Blood transfusion without reported diagnosis   . Breast cancer ([place]) 07/12/2013   Invasive Mammary Carcinoma  . DVT (deep vein thrombosis) in pregnancy ([place])   . Hypertension   . Hypothyroid   . Personal history of radiation therapy   . Pneumonia   . Radiation 11/21/13-01/07/14   Right Breast/Supraclavicular    Past Surgical History:  Procedure Laterality Date  . BREAST LUMPECTOMY WITH RADIOACTIVE SEED LOCALIZATION Right 09/09/2013   Procedure: BREAST LUMPECTOMY WITH RADIOACTIVE SEED LOCALIZATION WITH AXILLARY NODE EXCISION;  Surgeon: [name], MD;  Location: [place];  Service: General;  Laterality: Right;  . DENTAL SURGERY  04/19/2012   13 TEETH REMOVED  . DILATION AND CURETTAGE OF UTERUS    . RE-EXCISION OF BREAST LUMPECTOMY Right 09/24/2013   Procedure: RE-EXCISION OF RIGHT BREAST LUMPECTOMY;  Surgeon: [name], MD;  Location: [place];  Service: General;  Laterality: Right;    There were no vitals filed for this visit.      Subjective Assessment - 12/13/16 1526    Subjective Pt has not yet heard about the MRI.  She asks for Dr. Alcario Drought phone number so she can call her. She says her left hip is  hurting down to the front of her leg. She has visitors at her home and feels like she hurt her leg when she was hurrying around at home    Pertinent History breast cancer in 2015 with lumpectomy and 4 sentinel nodes with second surgery to get clear margins.  She underwent  radiation.  Multiple complaints of pain in back, left legs neck right  shoulder and arm. She had a near fall in December and twisted her back and caught herself with right arm and has pain since. Pt reports she has not followed up with community exercise program   Patient Stated Goals get some relief from the pain    Currently in Pain? Yes   Pain Score 7    Pain Location Groin   Pain Orientation Left   Pain Descriptors / Indicators Sharp   Pain Type Chronic pain   Pain Radiating Towards to ankle    Pain Onset More than a month ago   Pain Frequency Intermittent   Aggravating Factors  when she moves    Pain Relieving Factors she got a back belt at [facility] and that seems to help.                          [facility] Adult PT Treatment/Exercise - 12/13/16 0001      Moist Heat Therapy   Number Minutes Moist Heat 20 Minutes   Moist  Heat Location Lumbar Spine     Manual Therapy   Manual therapy comments Pulling to both LE for traction through leg and to spine that pt felt was beneficial     Soft tissue mobilization with biotone, to low back  and right cervical area in right sidelying   deep pressure to left paraspinals and trigger points    Passive ROM stretching to left hip especially to hip flexors  and into lumbar flexion for pain relief                         [facility] Goals - 11/15/16 2014      CC Long Term Goal  #1   Title Pt will report a 50% reduction in low back pain    Time 4   Status On-going     CC Long Term Goal  #2   Title Pt will be independent in an exercise program to help with low back pain    Time 4   Period Weeks   Status New     CC Long Term Goal  #3    Title Pt will have a decrease in Quick DASH to < 38 demonstrating functional improvment of right arm     Baseline 52.27 o 09/29/2016   Time 4   Period Weeks   Status On-going     CC Long Term Goal  #4   Title Pt will report a 50 % decrease in right upper quadrant pain    Time 4   Period Weeks   Status On-going     CC Long Term Goal  #5   Title Pt will increase repetitions fo sit to stand in 30 seconds to 8 demonstrating and increase in generalized strength and function   Baseline 6 in 30 seconds limited by leg pain   Time 4   Period Weeks   Status On-going            Plan - 12/13/16 1736    Clinical Impairments Affecting Rehab Potential previous radiation, 4 nodes removed,  chronic pain limiting her activities    PT Frequency 2x / week   PT Duration 4 weeks   PT Treatment/Interventions Manual techniques;ADLs/Self Care Home Management;Patient/family education;Orthotic Fit/Training;Taping;Electrical Stimulation;Therapeutic activities;Therapeutic exercise;Passive range of motion;Scar mobilization;Manual lymph drainage;Compression bandaging;Moist Heat;Neuromuscular re-education;Gait training;Stair training   PT Next Visit Plan Continue moist heat and soft tissue work, Add lumbar extension and mobility  exercises, gait training progressing to cane and no device    PT Plan Pt arrives late and still has many complaints.  She says she feels good when she leaves here, then something happens and her pain returns.  She says she is active at home. She will follow up to see when she will get her MRI.       Patient will benefit from skilled therapeutic intervention in order to improve the following deficits and impairments:     Visit Diagnosis: Physical deconditioning  Low back pain with left-sided sciatica, unspecified back pain laterality, unspecified chronicity  Pain in right arm  Risk for falls     Problem List Patient Active Problem List   Diagnosis Date Noted  . Lymphedema  09/17/2015  . Long term current use of anticoagulant therapy 08/23/2015  . DVT, lower extremity ([place]) 06/18/2015  . Bilateral knee pain 04/03/2015  . Arm edema 08/28/2014  . Vitamin D deficiency 04/23/2014  . Hyperparathyroidism ([place]) 04/23/2014  . Depression 07/18/2013  .  Malignant neoplasm of upper-outer quadrant of right breast in female, estrogen receptor positive ([place]) 07/15/2013  . Encounter for therapeutic drug monitoring 07/05/2013  . Overactive bladder 01/30/2013  . Primary hypercoagulable state ([place]) 12/19/2012  . HTN (hypertension) 09/27/2011  . Hearing loss 09/27/2011  . Chest wall pain 09/27/2011  . Seasonal allergies 09/27/2011  . SOB (shortness of breath), related to deconditioning 08/26/2011  . Hypothyroid 08/26/2011   [name]. Owens Shark PT  [name] 12/13/2016, 5:38 PM  [place], [name], [address] Phone: [phone]   Fax:  [phone]  Name: VERBLE STYRON MRN: [id number] Date of Birth: 1940-09-11

## 2016-12-14 NOTE — Telephone Encounter (Signed)
Per documentation in lab results, I spoke with patient on 12/07/2016 regarding her results.  I was informed that she had been given her results by her endocrinologist.    Attempted to call patient today to apologize for any confusion and go over labs again with her.  Patient didn't answer.  I left a message asking her to return my call.

## 2016-12-14 NOTE — Telephone Encounter (Signed)
Please advise 

## 2016-12-14 NOTE — Telephone Encounter (Signed)
Patient called to ask about her lab results. Advised that it appears we were having endo to go over results. She states that they did not. She asks that you give her a call to go over labs and additionally mail a copy to her.   She made it clear to me that she felt like her care fell through the cracks on this. I apologized. When you speak with her, please acknowledge and apologize as well.

## 2016-12-15 ENCOUNTER — Other Ambulatory Visit (HOSPITAL_BASED_OUTPATIENT_CLINIC_OR_DEPARTMENT_OTHER): Payer: Medicare Other

## 2016-12-15 ENCOUNTER — Ambulatory Visit: Payer: Medicare Other | Admitting: Physical Therapy

## 2016-12-15 ENCOUNTER — Telehealth: Payer: Self-pay

## 2016-12-15 DIAGNOSIS — M8580 Other specified disorders of bone density and structure, unspecified site: Secondary | ICD-10-CM

## 2016-12-15 DIAGNOSIS — M79601 Pain in right arm: Secondary | ICD-10-CM

## 2016-12-15 DIAGNOSIS — R5381 Other malaise: Secondary | ICD-10-CM | POA: Diagnosis not present

## 2016-12-15 DIAGNOSIS — I890 Lymphedema, not elsewhere classified: Secondary | ICD-10-CM | POA: Diagnosis not present

## 2016-12-15 DIAGNOSIS — I82403 Acute embolism and thrombosis of unspecified deep veins of lower extremity, bilateral: Secondary | ICD-10-CM | POA: Diagnosis present

## 2016-12-15 DIAGNOSIS — M5442 Lumbago with sciatica, left side: Secondary | ICD-10-CM | POA: Diagnosis not present

## 2016-12-15 DIAGNOSIS — Z9181 History of falling: Secondary | ICD-10-CM | POA: Diagnosis not present

## 2016-12-15 DIAGNOSIS — M25611 Stiffness of right shoulder, not elsewhere classified: Secondary | ICD-10-CM | POA: Diagnosis not present

## 2016-12-15 DIAGNOSIS — C50411 Malignant neoplasm of upper-outer quadrant of right female breast: Secondary | ICD-10-CM

## 2016-12-15 DIAGNOSIS — Z170 Estrogen receptor positive status [ER+]: Principal | ICD-10-CM

## 2016-12-15 LAB — CBC WITH DIFFERENTIAL/PLATELET
BASO%: 0.8 % (ref 0.0–2.0)
Basophils Absolute: 0.1 10*3/uL (ref 0.0–0.1)
EOS ABS: 0.1 10*3/uL (ref 0.0–0.5)
EOS%: 1.6 % (ref 0.0–7.0)
HEMATOCRIT: 41.9 % (ref 34.8–46.6)
HEMOGLOBIN: 13.9 g/dL (ref 11.6–15.9)
LYMPH#: 1.5 10*3/uL (ref 0.9–3.3)
LYMPH%: 24 % (ref 14.0–49.7)
MCH: 31.2 pg (ref 25.1–34.0)
MCHC: 33.2 g/dL (ref 31.5–36.0)
MCV: 93.9 fL (ref 79.5–101.0)
MONO#: 0.7 10*3/uL (ref 0.1–0.9)
MONO%: 11.6 % (ref 0.0–14.0)
NEUT%: 62 % (ref 38.4–76.8)
NEUTROS ABS: 3.8 10*3/uL (ref 1.5–6.5)
Platelets: 201 10*3/uL (ref 145–400)
RBC: 4.46 10*6/uL (ref 3.70–5.45)
RDW: 13.9 % (ref 11.2–14.5)
WBC: 6.1 10*3/uL (ref 3.9–10.3)
nRBC: 0 % (ref 0–0)

## 2016-12-15 LAB — PROTIME-INR
INR: 3.3 (ref 2.00–3.50)
Protime: 39.6 Seconds — ABNORMAL HIGH (ref 10.6–13.4)

## 2016-12-15 NOTE — Therapy (Signed)
[place], [name], [address] Phone: [phone]   Fax:  [phone]  Physical Therapy Treatment  Patient Details  Name: Alicia Vasquez MRN: [id number] Date of Birth: Jul 18, 1940 Referring Provider: Tivis Ringer   Encounter Date: 12/15/2016      PT End of Session - 12/15/16 1732    Visit Number 19   Number of Visits 20   Date for PT Re-Evaluation 12/23/16   PT Start Time 1937   PT Stop Time 1600   PT Time Calculation (min) 45 min   Activity Tolerance Patient tolerated treatment well   Behavior During Therapy [facility] for tasks assessed/performed      Past Medical History:  Diagnosis Date  . Allergy   . Anxiety   . Arthritis   . Blood transfusion without reported diagnosis   . Breast cancer ([place]) 07/12/2013   Invasive Mammary Carcinoma  . DVT (deep vein thrombosis) in pregnancy ([place])   . Hypertension   . Hypothyroid   . Personal history of radiation therapy   . Pneumonia   . Radiation 11/21/13-01/07/14   Right Breast/Supraclavicular    Past Surgical History:  Procedure Laterality Date  . BREAST LUMPECTOMY WITH RADIOACTIVE SEED LOCALIZATION Right 09/09/2013   Procedure: BREAST LUMPECTOMY WITH RADIOACTIVE SEED LOCALIZATION WITH AXILLARY NODE EXCISION;  Surgeon: [name], MD;  Location: [place];  Service: General;  Laterality: Right;  . DENTAL SURGERY  04/19/2012   13 TEETH REMOVED  . DILATION AND CURETTAGE OF UTERUS    . RE-EXCISION OF BREAST LUMPECTOMY Right 09/24/2013   Procedure: RE-EXCISION OF RIGHT BREAST LUMPECTOMY;  Surgeon: [name], MD;  Location: [place];  Service: General;  Laterality: Right;    There were no vitals filed for this visit.      Subjective Assessment - 12/15/16 1520    Subjective Pt is struggling walking in to PT today.  She has been at the [facility] for blood work  She has been busy at home with  family in town for [place]. She was doing well up until earler today when she picked up a bag of trash and tossed it.  She felt a tweak of pain in her back , left groin and knee and has been hurting ever since. Pt called Dr. Alcario Drought office about not hearing about MRI and was told someone would call her. She has not heard anything yet   Pertinent History breast cancer in 2015 with lumpectomy and 4 sentinel nodes with second surgery to get clear margins.  She underwent  radiation.  Multiple complaints of pain in back, left legs neck right  shoulder and arm. She had a near fall in December and twisted her back and caught herself with right arm and has pain since. Pt reports she has not followed up with community exercise program   Patient Stated Goals get some relief from the pain    Currently in Pain? Yes   Pain Score 5    Pain Orientation Left   Pain Descriptors / Indicators Sharp   Pain Type Chronic pain   Pain Radiating Towards to left ankle    Pain Onset More than a month ago   Pain Frequency Intermittent   Aggravating Factors  hurts when she moves    Pain Relieving Factors back belt has been helping    Effect of Pain on Daily Activities difficulty walking and bearing weight on her legs  [facility] Adult PT Treatment/Exercise - 12/15/16 0001      Knee/Hip Exercises: Supine   Short Arc Quad Sets Strengthening;2 sets;10 reps   Short Arc Quad Sets Limitations 10 with no weight, 10 with 2#    Other Supine Knee/Hip Exercises pt with too much pain in left hip to lift LLE up for supine marches      Moist Heat Therapy   Number Minutes Moist Heat 20 Minutes   Moist Heat Location Lumbar Spine     Manual Therapy   Soft tissue mobilization with biotone, to low back  area in right sidelying   deep pressure to left paraspinals and trigger points                         [facility] Goals - 11/15/16 2014      CC Long Term Goal  #1    Title Pt will report a 50% reduction in low back pain    Time 4   Status On-going     CC Long Term Goal  #2   Title Pt will be independent in an exercise program to help with low back pain    Time 4   Period Weeks   Status New     CC Long Term Goal  #3   Title Pt will have a decrease in Quick DASH to < 38 demonstrating functional improvment of right arm     Baseline 52.27 o 09/29/2016   Time 4   Period Weeks   Status On-going     CC Long Term Goal  #4   Title Pt will report a 50 % decrease in right upper quadrant pain    Time 4   Period Weeks   Status On-going     CC Long Term Goal  #5   Title Pt will increase repetitions fo sit to stand in 30 seconds to 8 demonstrating and increase in generalized strength and function   Baseline 6 in 30 seconds limited by leg pain   Time 4   Period Weeks   Status On-going            Plan - 12/15/16 1733    Clinical Impairments Affecting Rehab Potential previous radiation, 4 nodes removed,  chronic pain limiting her activities    PT Frequency 2x / week   PT Duration 4 weeks   PT Next Visit Plan Gcode next visit Continue moist heat and soft tissue work, Add lumbar extension and mobility  exercises, gait training progressing to cane and no device    Consulted and Agree with Plan of Care Patient   PT Plan Pt continues to have pain in back and left groing and LLE that impairs her walking even with a RW at times.  She has difficulty with left hip flexion.  She is still waiting to hear when her MRI will be scheduled for diagnositics. She reprots she feels better with treatment and is doing better at home, but something seems to happen to exacerbate her pain prior to her PT visits.       Patient will benefit from skilled therapeutic intervention in order to improve the following deficits and impairments:     Visit Diagnosis: Physical deconditioning  Low back pain with left-sided sciatica, unspecified back pain laterality, unspecified  chronicity  Pain in right arm  Risk for falls     Problem List Patient Active Problem List   Diagnosis Date Noted  .  Lymphedema 09/17/2015  . Long term current use of anticoagulant therapy 08/23/2015  . DVT, lower extremity ([place]) 06/18/2015  . Bilateral knee pain 04/03/2015  . Arm edema 08/28/2014  . Vitamin D deficiency 04/23/2014  . Hyperparathyroidism ([place]) 04/23/2014  . Depression 07/18/2013  . Malignant neoplasm of upper-outer quadrant of right breast in female, estrogen receptor positive ([place]) 07/15/2013  . Encounter for therapeutic drug monitoring 07/05/2013  . Overactive bladder 01/30/2013  . Primary hypercoagulable state ([place]) 12/19/2012  . HTN (hypertension) 09/27/2011  . Hearing loss 09/27/2011  . Chest wall pain 09/27/2011  . Seasonal allergies 09/27/2011  . SOB (shortness of breath), related to deconditioning 08/26/2011  . Hypothyroid 08/26/2011   [name]. Owens Shark PT  [name] 12/15/2016, 5:37 PM  [place], [name], [address] Phone: [phone]   Fax:  [phone]  Name: Alicia Vasquez MRN: [id number] Date of Birth: [birth year]

## 2016-12-15 NOTE — Telephone Encounter (Signed)
IRN 3.3  LVM on pt's phone instructing pt to continue current dose of warfarin.    09/22/16 last documentation of specific dosage schedule of warfarin.  Pt instructed to return call if this is incorrect and she is taking a different dose so we may update our records.

## 2016-12-16 DIAGNOSIS — H5203 Hypermetropia, bilateral: Secondary | ICD-10-CM | POA: Diagnosis not present

## 2016-12-16 DIAGNOSIS — H2513 Age-related nuclear cataract, bilateral: Secondary | ICD-10-CM | POA: Diagnosis not present

## 2016-12-16 NOTE — Telephone Encounter (Signed)
LM to return call.

## 2016-12-19 ENCOUNTER — Ambulatory Visit (INDEPENDENT_AMBULATORY_CARE_PROVIDER_SITE_OTHER): Payer: Medicare Other | Admitting: Family Medicine

## 2016-12-19 ENCOUNTER — Ambulatory Visit (INDEPENDENT_AMBULATORY_CARE_PROVIDER_SITE_OTHER): Payer: Medicare Other

## 2016-12-19 VITALS — BP 114/62 | HR 95 | Temp 97.8°F | Ht 64.0 in | Wt 209.6 lb

## 2016-12-19 DIAGNOSIS — R350 Frequency of micturition: Secondary | ICD-10-CM | POA: Diagnosis not present

## 2016-12-19 DIAGNOSIS — M8580 Other specified disorders of bone density and structure, unspecified site: Secondary | ICD-10-CM

## 2016-12-19 DIAGNOSIS — M25552 Pain in left hip: Secondary | ICD-10-CM

## 2016-12-19 DIAGNOSIS — M549 Dorsalgia, unspecified: Secondary | ICD-10-CM | POA: Diagnosis not present

## 2016-12-19 DIAGNOSIS — M5442 Lumbago with sciatica, left side: Secondary | ICD-10-CM

## 2016-12-19 DIAGNOSIS — G8929 Other chronic pain: Secondary | ICD-10-CM | POA: Diagnosis not present

## 2016-12-19 DIAGNOSIS — M47816 Spondylosis without myelopathy or radiculopathy, lumbar region: Secondary | ICD-10-CM | POA: Diagnosis not present

## 2016-12-19 LAB — POCT URINALYSIS DIPSTICK
Bilirubin, UA: NEGATIVE
Blood, UA: NEGATIVE
Glucose, UA: NEGATIVE
Ketones, UA: NEGATIVE
Leukocytes, UA: NEGATIVE
Nitrite, UA: NEGATIVE
Protein, UA: NEGATIVE
Spec Grav, UA: 1.015 (ref 1.010–1.025)
Urobilinogen, UA: 0.2 E.U./dL
pH, UA: 6.5 (ref 5.0–8.0)

## 2016-12-19 MED ORDER — GABAPENTIN 100 MG PO CAPS: 100.0000 mg | ORAL_CAPSULE | Freq: Every day | ORAL | 3 refills | Status: DC

## 2016-12-19 NOTE — Progress Notes (Signed)
Alicia Vasquez is a 76 y.o. female here for an acute visit.  History of Present Illness:   [name] CMA acting as scribe for Dr. Juleen China.  HPI: Patient comes in today for left leg pain with left foot numbness. She states that she is having shooting pain that radiates down the leg. She was throwing trash in the large trash can on Wednesday. When doing this she heard a pop. She has been putting heat on leg since the injury. Pain scale is an 8 with movement. She has taken 500 mg tylenol toady about 1 PM. With palpitation of the leg she jumps and says ouch in pain.  Patient stated that she has trouble getting out of the bed. We will get Xray today and MRI scheduled.  She has been having frequent urination over the weekend. We will get a urine dip today.   PMHx, SurgHx, SocialHx, Medications, and Allergies were reviewed in the Visit Navigator and updated as appropriate.  Current Medications:    .  acetaminophen (TYLENOL) 500 MG tablet, Take 500 mg by mouth every 6 (six) hours as needed for mild pain, fever or headache. Reported on 09/08/2015, Disp: , Rfl:  .  Cholecalciferol (VITAMIN D3) 5000 UNITS TABS, Take by mouth., Disp: , Rfl:  .  Diclofenac Sodium (PENNSAID) 2 % SOLN, Place 1 application onto the skin daily., Disp: 8 g, Rfl: 0 .  HYDROcodone-acetaminophen (NORCO) 5-325 MG tablet, Take 1 tablet by mouth every 6 (six) hours as needed for moderate pain., Disp: 12 tablet, Rfl: 0 .  levothyroxine (SYNTHROID) 125 MCG tablet, Take 1 tablet (125 mcg total) by mouth daily before breakfast. Dispense as written: Synthroid LAST REFILL, Disp: 90 tablet, Rfl: 3 .  lisinopril (PRINIVIL,ZESTRIL) 10 MG tablet, TAKE 1 TABLET (10 MG TOTAL) BY MOUTH DAILY., Disp: 90 tablet, Rfl: 1 .  warfarin (JANTOVEN) 5 MG tablet, TAKE AS DIRECTED BY ANTICOAGULATION CLINIC, Disp: 60 tablet, Rfl: 3   Allergies  Allergen Reactions  . Anesthetics, Amide Hypertension  . Benadryl [Diphenhydramine Hcl] Other (See  Comments)    Dizziness  . Carbocaine [Mepivacaine Hcl] Hypertension  . Codeine Other (See Comments)    Dizziness  . Epinephrine Hypertension  . Sulfa Antibiotics Other (See Comments)    Dizziness  . Penicillins Rash   Review of Systems:   Pertinent items are noted in the HPI. Otherwise, ROS is negative.  Vitals:   Vitals:   12/19/16 1437  BP: 114/62  Pulse: 95  Temp: 97.8 F (36.6 C)  TempSrc: Oral  SpO2: 96%  Weight: 209 lb 9.6 oz (95.1 kg)  Height: 5\' 4"  (1.626 m)     Body mass index is 35.98 kg/m.  Physical Exam:   Physical Exam  Constitutional: She appears well-developed and well-nourished. No distress.  HENT:  Head: Normocephalic and atraumatic.  Eyes: Pupils are equal, round, and reactive to light. EOM are normal.  Neck: Normal range of motion. Neck supple.  Cardiovascular: Normal rate, regular rhythm, normal heart sounds and intact distal pulses.   Pulmonary/Chest: Effort normal.  Abdominal: Soft.  Musculoskeletal:       Left hip: She exhibits decreased range of motion and bony tenderness.  Skin: Skin is warm.  Psychiatric: She has a normal mood and affect. Her behavior is normal.  Nursing note and vitals reviewed.   Results for orders placed or performed in visit on 12/19/16  POCT urinalysis dipstick  Result Value Ref Range   Color, UA Yellow  Clarity, UA Clear    Glucose, UA Negative    Bilirubin, UA Negative    Ketones, UA Negative    Spec Grav, UA 1.015 1.010 - 1.025   Blood, UA Negative    pH, UA 6.5 5.0 - 8.0   Protein, UA Negative    Urobilinogen, UA 0.2 0.2 or 1.0 E.U./dL   Nitrite, UA Negative    Leukocytes, UA Negative Negative   EXAM: LUMBAR SPINE - 2-3 VIEW  COMPARISON:  Lumbar spine series 04/19/2016.  FINDINGS: Paraspinal soft tissues are normal. Lumbar spine numbered as per prior exam. Mild scoliosis concave right. Diffuse multilevel degenerative change . Stable mild T12 compression fracture. Pelvic calcifications most  likely phleboliths. Calcifications noted over the spleen most likely calcified granulomas. Calcification over the right lung base most likely calcified granuloma . Aortic atherosclerotic vascular calcification .  IMPRESSION: 1. Mild scoliosis concave right with diffuse multilevel degenerative change.  2. Stable mild T12 compression fracture. No change from 04/19/2016. No acute or new focal abnormality identified.  EXAM: DG HIP (WITH OR WITHOUT PELVIS) 5+V BILAT  COMPARISON:  Left hip series 08/28/2016.  Lumbar spine 04/19/2016.  FINDINGS: Questionable sclerotic density noted over the proximal left femur. This was not present on prior hip series of 08/28/2016 . Degenerative changes both lower lumbar spine, SI joints, both hips. No acute abnormality . No evidence of fracture or dislocation. Tendinous ossification noted adjacent to the ischii.  IMPRESSION: 1. Questionable sclerotic lesion in the proximal left femur, not present on prior study of 08/28/2016. Whole-body bone scan with attention to the left proximal femur is suggested to further evaluate .  2. Degenerative changes lumbar spine, both SI joints, both hips. Diffuse osteopenia. No evidence of fracture or dislocation.  Assessment and Plan:   Krisinda was seen today for leg pain.  Diagnoses and all orders for this visit:  Chronic left-sided low back pain with left-sided sciatica Comments: Neurontin added. MRI already pending.  Orders: -     DG Lumbar Spine 2-3 Views; Future -     gabapentin (NEURONTIN) 100 MG capsule; Take 1 capsule (100 mg total) by mouth at bedtime.  Frequent urination Comments: Negative Udip today. History of overactive bladder. Reviewed red flags.  Orders: -     POCT urinalysis dipstick  Osteopenia determined by x-ray Comments: Will need to be addressed. Will wait until further scans completed.   Acute pain of left hip Comments: See above for concern. Will order bne scan per rec.  Patient with history of breast cancer. Orders: -     DG HIPS BILAT W OR W/O PELVIS MIN 5 VIEWS; Future    . Reviewed expectations re: course of current medical issues. . Discussed self-management of symptoms. . Outlined signs and symptoms indicating need for more acute intervention. . Patient verbalized understanding and all questions were answered. Marland Kitchen Health Maintenance issues including appropriate healthy diet, exercise, and smoking avoidance were discussed with patient. . See orders for this visit as documented in the electronic medical record. . Patient received an After Visit Summary.  CMA served as Education administrator during this visit. History, Physical, and Plan performed by medical provider. The above documentation has been reviewed and is accurate and complete. Briscoe Deutscher, D.O.  Briscoe Deutscher, DO [place], [place] 12/24/2016  Future Appointments Date Time Provider [place]  12/27/2016 2:30 PM Kipp Laurence, PT [facility] None  12/30/2016 11:00 AM Kipp Laurence, PT [facility] None  12/31/2016 5:10 PM [facility]315 MR 3 [id number] GI-315  W. WE  01/12/2017 2:30 PM [facility] LAB 1 [facility] None  01/19/2017 2:15 PM Magrinat, [name], MD [facility] None  03/07/2017 2:15 PM Briscoe Deutscher, DO [facility] None  06/08/2017 3:00 PM Philemon Kingdom, MD [facility] None

## 2016-12-19 NOTE — Telephone Encounter (Signed)
Patient is coming in for appointment today.

## 2016-12-20 ENCOUNTER — Ambulatory Visit: Payer: Medicare Other | Admitting: Physical Therapy

## 2016-12-20 DIAGNOSIS — M25611 Stiffness of right shoulder, not elsewhere classified: Secondary | ICD-10-CM | POA: Diagnosis not present

## 2016-12-20 DIAGNOSIS — M5442 Lumbago with sciatica, left side: Secondary | ICD-10-CM

## 2016-12-20 DIAGNOSIS — Z9181 History of falling: Secondary | ICD-10-CM

## 2016-12-20 DIAGNOSIS — R5381 Other malaise: Secondary | ICD-10-CM

## 2016-12-20 DIAGNOSIS — I890 Lymphedema, not elsewhere classified: Secondary | ICD-10-CM | POA: Diagnosis not present

## 2016-12-20 DIAGNOSIS — M79601 Pain in right arm: Secondary | ICD-10-CM | POA: Diagnosis not present

## 2016-12-20 NOTE — Therapy (Signed)
[place], [name], [address] Phone: [phone]   Fax:  [phone]  Physical Therapy Treatment  Patient Details  Name: Alicia Vasquez MRN: [id number] Date of Birth: December 30, 1940 Referring Provider: Wilber Bihari   Encounter Date: 12/20/2016      PT End of Session - 12/20/16 1703    Visit Number 20   Number of Visits 20   Date for PT Re-Evaluation 12/23/16   PT Start Time 1430   PT Stop Time 1515   PT Time Calculation (min) 45 min   Activity Tolerance Patient limited by pain   Behavior During Therapy [facility] for tasks assessed/performed      Past Medical History:  Diagnosis Date  . Allergy   . Anxiety   . Arthritis   . Blood transfusion without reported diagnosis   . Breast cancer ([place]) 07/12/2013   Invasive Mammary Carcinoma  . DVT (deep vein thrombosis) in pregnancy ([place])   . Hypertension   . Hypothyroid   . Personal history of radiation therapy   . Pneumonia   . Radiation 11/21/13-01/07/14   Right Breast/Supraclavicular    Past Surgical History:  Procedure Laterality Date  . BREAST LUMPECTOMY WITH RADIOACTIVE SEED LOCALIZATION Right 09/09/2013   Procedure: BREAST LUMPECTOMY WITH RADIOACTIVE SEED LOCALIZATION WITH AXILLARY NODE EXCISION;  Surgeon: [name], MD;  Location: [place];  Service: General;  Laterality: Right;  . DENTAL SURGERY  04/19/2012   13 TEETH REMOVED  . DILATION AND CURETTAGE OF UTERUS    . RE-EXCISION OF BREAST LUMPECTOMY Right 09/24/2013   Procedure: RE-EXCISION OF RIGHT BREAST LUMPECTOMY;  Surgeon: [name], MD;  Location: [place];  Service: General;  Laterality: Right;    There were no vitals filed for this visit.      Subjective Assessment - 12/20/16 1447    Subjective Pt with difficulty walking into clinic.  "I got worse over the weekend"  She went to see Dr. Juleen China yesterday for xrays which showed arthritis.   She is scheduled for a MRI on August 4 She cames in wearing her lumbo sacral support that she got a [facility], but it appears to be too small. Pt has difficulty fastening it in the front and it tends to ride up around her waist. instead of down over the low back.    Pertinent History breast cancer in 2015 with lumpectomy and 4 sentinel nodes with second surgery to get clear margins.  She underwent  radiation.  Multiple complaints of pain in back, left legs neck right  shoulder and arm. She had a near fall in December and twisted her back and caught herself with right arm and has pain since. Pt reports she has not followed up with community exercise program   Patient Stated Goals get some relief from the pain    Currently in Pain? Yes   Pain Score 7    Pain Location Groin   Pain Orientation Left   Pain Descriptors / Indicators Sharp   Pain Type Chronic pain   Pain Radiating Towards to left ankle    Pain Onset More than a month ago   Pain Frequency Intermittent   Aggravating Factors  walking    Pain Relieving Factors physical therapy             [facility] PT Assessment - 12/20/16 0001      Assessment   Referring Provider Wilber Bihari      Bed Mobility  Bed Mobility Rolling Right;Rolling Left;Right Sidelying to Sit   Rolling Right 3: Mod assist   Rolling Right Details (indicate cue type and reason) limited by pain in left groin    Rolling Left 3: Mod assist   Right Sidelying to Sit 3: Mod assist   Right Sidelying to Sit Details (indicate cue type and reason) much pain!                     [place] Adult PT Treatment/Exercise - 12/20/16 0001      Self-Care   Other Self-Care Comments  attempted to add extension of fabric with velcro to back support, but suspect pt will need to get a larger one.      Manual Therapy   Soft tissue mobilization with biotone, to low back  area in right sidelying   deep pressure to left paraspinals and trigger points    Passive ROM stretching  to left hip especially to hip flexors  and into lumbar flexion for pain relief                         Long Term Clinic Goals - 12/20/16 1707      CC Long Term Goal  #1   Title Pt will report a 50% reduction in low back pain    Time 4   Period Weeks   Status On-going     CC Long Term Goal  #2   Title Pt will be independent in an exercise program to help with low back pain    Time 4   Period Weeks   Status On-going     CC Long Term Goal  #3   Title Pt will have a decrease in Quick DASH to < 38 demonstrating functional improvment of right arm     Status Deferred  treatment emphasis now on low back pain      CC Long Term Goal  #4   Title Pt will be independent in sit to supine and return.    Baseline now needs mod assist due to pain    Time 4   Period Weeks   Status New     CC Long Term Goal  #5   Title Pt will increase repetitions fo sit to stand in 30 seconds to 8 demonstrating and increase in generalized strength and function   Baseline 6 in 30 seconds limited by leg pain   Status On-going            Plan - 12/20/16 1704    Clinical Impression Statement Pt pain appears to be worse today.  She had another visit with Dr. Juleen China and has MRI scheduled for Aug 4.  She has a situation at home that she says is causing her to be more active,but she feels that she is having much more difficulty.  She has not yet gotten an appointment with pain clinic and feels that she wants to follow up with [facility].  Pt needs to continue with PT as she says she feels temporarily better after she leaves here and she continues to have pain that interferes with her function.     Rehab Potential Fair   Clinical Impairments Affecting Rehab Potential previous radiation, 4 nodes removed,  chronic pain limiting her activities    PT Frequency 2x / week   PT Duration 4 weeks   PT Treatment/Interventions Manual techniques;ADLs/Self Care Home Management;Patient/family  education;Orthotic Fit/Training;Taping;Electrical Stimulation;Therapeutic activities;Therapeutic exercise;Passive range  of motion;Scar mobilization;Manual lymph drainage;Compression bandaging;Moist Heat;Neuromuscular re-education;Gait training;Stair training   PT Next Visit Plan Continue moist heat and soft tissue work, Add lumbar extension and mobility  exercises, gait training progressing to cane and no device    Consulted and Agree with Plan of Care Patient      Patient will benefit from skilled therapeutic intervention in order to improve the following deficits and impairments:  Obesity, Increased edema, Increased fascial restricitons, Impaired UE functional use, Pain, Decreased mobility, Difficulty walking, Increased muscle spasms, Decreased range of motion, Impaired perceived functional ability, Postural dysfunction, Impaired flexibility  Visit Diagnosis: Physical deconditioning - Plan: PT plan of care cert/re-cert  Low back pain with left-sided sciatica, unspecified back pain laterality, unspecified chronicity - Plan: PT plan of care cert/re-cert  Pain in right arm - Plan: PT plan of care cert/re-cert  Risk for falls - Plan: PT plan of care cert/re-cert       G-Codes - Dec 31, 2016 1709    Functional Assessment Tool Used (Outpatient Only) Clinical Judgement    Functional Limitation Mobility: Walking and moving around   Mobility: Walking and Moving Around Current Status ([id number]) At least 60 percent but less than 80 percent impaired, limited or restricted   Mobility: Walking and Moving Around Goal Status [phone]) At least 40 percent but less than 60 percent impaired, limited or restricted      Problem List Patient Active Problem List   Diagnosis Date Noted  . Lymphedema 09/17/2015  . Long term current use of anticoagulant therapy 08/23/2015  . DVT, lower extremity ([place]) 06/18/2015  . Bilateral knee pain 04/03/2015  . Arm edema 08/28/2014  . Vitamin D deficiency 04/23/2014  .  Hyperparathyroidism ([place]) 04/23/2014  . Depression 07/18/2013  . Malignant neoplasm of upper-outer quadrant of right breast in female, estrogen receptor positive ([place]) 07/15/2013  . Encounter for therapeutic drug monitoring 07/05/2013  . Overactive bladder 01/30/2013  . Primary hypercoagulable state ([place]) 12/19/2012  . HTN (hypertension) 09/27/2011  . Hearing loss 09/27/2011  . Chest wall pain 09/27/2011  . Seasonal allergies 09/27/2011  . SOB (shortness of breath), related to deconditioning 08/26/2011  . Hypothyroid 08/26/2011  [name]. Owens Shark PT   [name] 2016/12/31, 5:13 PM  [place], [name], [address] Phone: [phone]   Fax:  [phone]  Name: Alicia Vasquez MRN: [id number] Date of Birth: 1940/11/28

## 2016-12-22 ENCOUNTER — Ambulatory Visit: Payer: Medicare Other | Admitting: Physical Therapy

## 2016-12-22 DIAGNOSIS — M79601 Pain in right arm: Secondary | ICD-10-CM | POA: Diagnosis not present

## 2016-12-22 DIAGNOSIS — I890 Lymphedema, not elsewhere classified: Secondary | ICD-10-CM | POA: Diagnosis not present

## 2016-12-22 DIAGNOSIS — Z9181 History of falling: Secondary | ICD-10-CM

## 2016-12-22 DIAGNOSIS — M5442 Lumbago with sciatica, left side: Secondary | ICD-10-CM | POA: Diagnosis not present

## 2016-12-22 DIAGNOSIS — M25611 Stiffness of right shoulder, not elsewhere classified: Secondary | ICD-10-CM | POA: Diagnosis not present

## 2016-12-22 DIAGNOSIS — R5381 Other malaise: Secondary | ICD-10-CM

## 2016-12-22 NOTE — Therapy (Signed)
[place], [name], [address] Phone: [phone]   Fax:  [phone]  Physical Therapy Treatment  Patient Details  Name: Alicia Vasquez MRN: [id number] Date of Birth: 11-29-1940 Referring Provider: Wilber Bihari   Encounter Date: 12/22/2016      PT End of Session - 12/22/16 1450    Visit Number 21  add KX,    Number of Visits 30   Date for PT Re-Evaluation 01/27/17   PT Start Time 2778  pt arrives late    PT Stop Time 1515   PT Time Calculation (min) 30 min   Activity Tolerance Patient tolerated treatment well   Behavior During Therapy [facility] for tasks assessed/performed      Past Medical History:  Diagnosis Date  . Allergy   . Anxiety   . Arthritis   . Blood transfusion without reported diagnosis   . Breast cancer ([place]) 07/12/2013   Invasive Mammary Carcinoma  . DVT (deep vein thrombosis) in pregnancy ([place])   . Hypertension   . Hypothyroid   . Personal history of radiation therapy   . Pneumonia   . Radiation 11/21/13-01/07/14   Right Breast/Supraclavicular    Past Surgical History:  Procedure Laterality Date  . BREAST LUMPECTOMY WITH RADIOACTIVE SEED LOCALIZATION Right 09/09/2013   Procedure: BREAST LUMPECTOMY WITH RADIOACTIVE SEED LOCALIZATION WITH AXILLARY NODE EXCISION;  Surgeon: [name], MD;  Location: [place];  Service: General;  Laterality: Right;  . DENTAL SURGERY  04/19/2012   13 TEETH REMOVED  . DILATION AND CURETTAGE OF UTERUS    . RE-EXCISION OF BREAST LUMPECTOMY Right 09/24/2013   Procedure: RE-EXCISION OF RIGHT BREAST LUMPECTOMY;  Surgeon: [name], MD;  Location: [place];  Service: General;  Laterality: Right;    There were no vitals filed for this visit.      Subjective Assessment - 12/22/16 1741    Subjective Pt thinks she "tore something" in her knee when she was trying to get out of bed.  She feels that she has  swelling in her left knee and ankle.  She wants to keep coming to PT as she gets relief right after treatment but then "things happen" when she goes home and the pain returns.  She feels that her arm is better .    Pertinent History breast cancer in 2015 with lumpectomy and 4 sentinel nodes with second surgery to get clear margins.  She underwent  radiation.  Multiple complaints of pain in back, left legs neck right  shoulder and arm. She had a near fall in December and twisted her back and caught herself with right arm and has pain since. Pt reports she has not followed up with community exercise program   Patient Stated Goals get some relief from the pain    Currently in Pain? Yes   Pain Score 7    Pain Location Groin  left knee ankle and back    Pain Orientation Left   Pain Descriptors / Indicators Sharp   Pain Type Chronic pain   Pain Radiating Towards to left ankle   Pain Onset More than a month ago   Pain Frequency Intermittent   Aggravating Factors  trying to get out of bed    Pain Relieving Factors physical therapy,   Effect of Pain on Daily Activities limits walking             [facility] PT Assessment - 12/22/16 0001  Observation/Other Assessments   Quick DASH  25              Quick Dash - 12/22/16 0001    Open a tight or new jar No difficulty   Do heavy household chores (wash walls, wash floors) Moderate difficulty   Carry a shopping bag or briefcase Mild difficulty   Wash your back Severe difficulty   Use a knife to cut food No difficulty   Recreational activities in which you take some force or impact through your arm, shoulder, or hand (golf, hammering, tennis) Severe difficulty   During the past week, to what extent has your arm, shoulder or hand problem interfered with your normal social activities with family, friends, neighbors, or groups? Not at all   During the past week, to what extent has your arm, shoulder or hand problem limited your work or other  regular daily activities Not at all   Arm, shoulder, or hand pain. Mild   Tingling (pins and needles) in your arm, shoulder, or hand Mild   Difficulty Sleeping No difficulty   DASH Score 25 %               [facility] Adult PT Treatment/Exercise - 12/22/16 0001      Knee/Hip Exercises: Seated   Long Arc Quad Strengthening;Both;10 reps;Limitations   Other Seated Knee/Hip Exercises heel raises,x 10    Marching AROM;Both;1 set;10 reps   Marching Limitations pain with left hip flexion      Shoulder Exercises: Seated   Other Seated Exercises both hands together at hip then out to front and to other hip x 10 reps for core activation      Moist Heat Therapy   Number Minutes Moist Heat 10 Minutes   Moist Heat Location Lumbar Spine     Manual Therapy   Soft tissue mobilization with biotone, to low back  area iin seated postion leaning forward on mat for lumbar stretch   deep pressure to left paraspinals and trigger points                         Long Term Clinic Goals - 12/22/16 1501      CC Long Term Goal  #1   Title Pt will report a 50% reduction in low back pain    Baseline pt states she still has pain in her back (and also in her hip and knees0    Status On-going     CC Long Term Goal  #2   Title Pt will be independent in an exercise program to help with low back pain    Baseline pt  has not been able to follow up at home with exercise    Status On-going     CC Long Term Goal  #3   Title Pt will have a decrease in Quick DASH to < 38 demonstrating functional improvment of right arm     Baseline 52.27 o 09/29/2016, 25 on 12/22/2016    Status Achieved     CC Long Term Goal  #4   Title Pt will be independent in sit to supine and return.    Baseline now needs mod assist due to pain    Time 4   Period Weeks   Status On-going     CC Long Term Goal  #5   Title Pt will increase repetitions fo sit to stand in 30 seconds to 8 demonstrating and increase in  generalized strength  and function   Baseline 6 in 30 seconds limited by leg pain   Period Weeks   Status On-going            Plan - 12/22/16 1750    Clinical Impression Statement Pt continues to have pain, especially with bed mobility.  Her arm is better and she had met the goal for Quick DASH.  She continues to feel better immediately after treatment    Rehab Potential Fair   Clinical Impairments Affecting Rehab Potential previous radiation, 4 nodes removed,  chronic pain limiting her activities    PT Frequency 2x / week   PT Duration 4 weeks   PT Treatment/Interventions Manual techniques;ADLs/Self Care Home Management;Patient/family education;Orthotic Fit/Training;Taping;Electrical Stimulation;Therapeutic activities;Therapeutic exercise;Passive range of motion;Scar mobilization;Manual lymph drainage;Compression bandaging;Moist Heat;Neuromuscular re-education;Gait training;Stair training   PT Next Visit Plan Continue moist heat and soft tissue work, Add lumbar extension and mobility  exercises, gait training progressing to cane and no device    Consulted and Agree with Plan of Care Patient      Patient will benefit from skilled therapeutic intervention in order to improve the following deficits and impairments:  Obesity, Increased edema, Increased fascial restricitons, Impaired UE functional use, Pain, Decreased mobility, Difficulty walking, Increased muscle spasms, Decreased range of motion, Impaired perceived functional ability, Postural dysfunction, Impaired flexibility  Visit Diagnosis: Physical deconditioning  Low back pain with left-sided sciatica, unspecified back pain laterality, unspecified chronicity  Risk for falls     Problem List Patient Active Problem List   Diagnosis Date Noted  . Lymphedema 09/17/2015  . Long term current use of anticoagulant therapy 08/23/2015  . DVT, lower extremity ([place]) 06/18/2015  . Bilateral knee pain 04/03/2015  . Arm edema 08/28/2014   . Vitamin D deficiency 04/23/2014  . Hyperparathyroidism ([place]) 04/23/2014  . Depression 07/18/2013  . Malignant neoplasm of upper-outer quadrant of right breast in female, estrogen receptor positive ([place]) 07/15/2013  . Encounter for therapeutic drug monitoring 07/05/2013  . Overactive bladder 01/30/2013  . Primary hypercoagulable state ([place]) 12/19/2012  . HTN (hypertension) 09/27/2011  . Hearing loss 09/27/2011  . Chest wall pain 09/27/2011  . Seasonal allergies 09/27/2011  . SOB (shortness of breath), related to deconditioning 08/26/2011  . Hypothyroid 08/26/2011   [name]. Owens Shark PT  [name] 12/22/2016, 5:54 PM  [place], [name], [address] Phone: [phone]   Fax:  [phone]  Name: Alicia Vasquez MRN: [id number] Date of Birth: 03-Apr-1941

## 2016-12-24 ENCOUNTER — Encounter: Payer: Self-pay | Admitting: Family Medicine

## 2016-12-27 ENCOUNTER — Ambulatory Visit: Payer: Medicare Other | Admitting: Physical Therapy

## 2016-12-27 DIAGNOSIS — S72112A Displaced fracture of greater trochanter of left femur, initial encounter for closed fracture: Secondary | ICD-10-CM | POA: Diagnosis not present

## 2016-12-27 DIAGNOSIS — Z9181 History of falling: Secondary | ICD-10-CM

## 2016-12-27 DIAGNOSIS — I890 Lymphedema, not elsewhere classified: Secondary | ICD-10-CM | POA: Diagnosis not present

## 2016-12-27 DIAGNOSIS — M25552 Pain in left hip: Secondary | ICD-10-CM | POA: Diagnosis not present

## 2016-12-27 DIAGNOSIS — M25611 Stiffness of right shoulder, not elsewhere classified: Secondary | ICD-10-CM | POA: Diagnosis not present

## 2016-12-27 DIAGNOSIS — R5381 Other malaise: Secondary | ICD-10-CM | POA: Diagnosis not present

## 2016-12-27 DIAGNOSIS — M5442 Lumbago with sciatica, left side: Secondary | ICD-10-CM | POA: Diagnosis not present

## 2016-12-27 DIAGNOSIS — M79601 Pain in right arm: Secondary | ICD-10-CM | POA: Diagnosis not present

## 2016-12-27 NOTE — Therapy (Signed)
[place], [name], [address] Phone: [phone]   Fax:  [phone]  Physical Therapy Treatment  Patient Details  Name: Alicia Vasquez MRN: [id number] Date of Birth: Mar 25, 1941 Referring Provider: Wilber Bihari   Encounter Date: 12/27/2016      PT End of Session - 12/27/16 1456    Visit Number 22  kx   Number of Visits 30   Date for PT Re-Evaluation 01/27/17   PT Start Time 1446  pt arrives late    PT Stop Time 1515   PT Time Calculation (min) 29 min      Past Medical History:  Diagnosis Date  . Allergy   . Anxiety   . Arthritis   . Blood transfusion without reported diagnosis   . Breast cancer ([place]) 07/12/2013   Invasive Mammary Carcinoma  . DVT (deep vein thrombosis) in pregnancy ([place])   . Hypertension   . Hypothyroid   . Personal history of radiation therapy   . Pneumonia   . Radiation 11/21/13-01/07/14   Right Breast/Supraclavicular    Past Surgical History:  Procedure Laterality Date  . BREAST LUMPECTOMY WITH RADIOACTIVE SEED LOCALIZATION Right 09/09/2013   Procedure: BREAST LUMPECTOMY WITH RADIOACTIVE SEED LOCALIZATION WITH AXILLARY NODE EXCISION;  Surgeon: [name], MD;  Location: [facility];  Service: General;  Laterality: Right;  . DENTAL SURGERY  04/19/2012   13 TEETH REMOVED  . DILATION AND CURETTAGE OF UTERUS    . RE-EXCISION OF BREAST LUMPECTOMY Right 09/24/2013   Procedure: RE-EXCISION OF RIGHT BREAST LUMPECTOMY;  Surgeon: [name], MD;  Location: [facility];  Service: General;  Laterality: Right;    There were no vitals filed for this visit.      Subjective Assessment - 12/27/16 1453    Subjective Pt states she has an appointment with Dr. Gladstone Lighter this aftenoon to discuss her back and groin pain.    Pertinent History breast cancer in 2015 with lumpectomy and 4 sentinel nodes with second surgery to get clear margins.  She  underwent  radiation.  Multiple complaints of pain in back, left legs neck right  shoulder and arm. She had a near fall in December and twisted her back and caught herself with right arm and has pain since. Pt reports she has not followed up with community exercise program   Patient Stated Goals get some relief from the pain    Currently in Pain? Yes   Pain Score 5    Pain Location Back  groin, knee and ankle    Pain Orientation Left   Pain Descriptors / Indicators Sharp   Pain Type Chronic pain   Pain Radiating Towards to left ankle    Pain Onset More than a month ago   Pain Frequency Intermittent   Aggravating Factors  moving    Pain Relieving Factors physcial therapy    Effect of Pain on Daily Activities limits walking                          [facility] Adult PT Treatment/Exercise - 12/27/16 0001      Lumbar Exercises: Standing   Other Standing Lumbar Exercises low rows with bilateral red theraband x 10 reps      Moist Heat Therapy   Number Minutes Moist Heat 20 Minutes   Moist Heat Location Lumbar Spine     Manual Therapy   Soft tissue mobilization with biotone, to low  back  area iin seated postion leaning forward on mat for lumbar stretch   deep pressure to left paraspinals and trigger points                         Long Term Clinic Goals - 12/22/16 1501      CC Long Term Goal  #1   Title Pt will report a 50% reduction in low back pain    Baseline pt states she still has pain in her back (and also in her hip and knees0    Status On-going     CC Long Term Goal  #2   Title Pt will be independent in an exercise program to help with low back pain    Baseline pt  has not been able to follow up at home with exercise    Status On-going     CC Long Term Goal  #3   Title Pt will have a decrease in Quick DASH to < 38 demonstrating functional improvment of right arm     Baseline 52.27 o 09/29/2016, 25 on 12/22/2016    Status Achieved     CC Long  Term Goal  #4   Title Pt will be independent in sit to supine and return.    Baseline now needs mod assist due to pain    Time 4   Period Weeks   Status On-going     CC Long Term Goal  #5   Title Pt will increase repetitions fo sit to stand in 30 seconds to 8 demonstrating and increase in generalized strength and function   Baseline 6 in 30 seconds limited by leg pain   Period Weeks   Status On-going            Plan - 12/27/16 1516    Clinical Impression Statement Pt is going to see Dr. Gladstone Lighter at [facility] today and has an appointment for an MRI on Saturday.  She has not heard from the chronic pain clinic.  She feels better after treatment of moist heat and massage and knows that she needs to increase her exercsie    Rehab Potential Fair   Clinical Impairments Affecting Rehab Potential previous radiation, 4 nodes removed,  chronic pain limiting her activities    PT Treatment/Interventions Manual techniques;ADLs/Self Care Home Management;Patient/family education;Orthotic Fit/Training;Taping;Electrical Stimulation;Therapeutic activities;Therapeutic exercise;Passive range of motion;Scar mobilization;Manual lymph drainage;Compression bandaging;Moist Heat;Neuromuscular re-education;Gait training;Stair training   PT Next Visit Plan Continue moist heat and soft tissue work, Add lumbar extension and mobility  exercises, gait training progressing to cane and no device    Consulted and Agree with Plan of Care Patient   PT Plan Pt is going to see Dr. Gladstone Lighter at [facility] today and has an appointment for an MRI on Saturday.  She has not heard from the chronic pain clinic.  She feels better after treatment of moist heat and massage and knows that she needs to increase her exercsie       Patient will benefit from skilled therapeutic intervention in order to improve the following deficits and impairments:  Obesity, Increased edema, Increased fascial restricitons, Impaired UE  functional use, Pain, Decreased mobility, Difficulty walking, Increased muscle spasms, Decreased range of motion, Impaired perceived functional ability, Postural dysfunction, Impaired flexibility  Visit Diagnosis: Physical deconditioning  Low back pain with left-sided sciatica, unspecified back pain laterality, unspecified chronicity  Risk for falls     Problem List Patient Active Problem List  Diagnosis Date Noted  . Lymphedema 09/17/2015  . Long term current use of anticoagulant therapy 08/23/2015  . DVT, lower extremity ([place]) 06/18/2015  . Bilateral knee pain 04/03/2015  . Arm edema 08/28/2014  . Vitamin D deficiency 04/23/2014  . Hyperparathyroidism ([place]) 04/23/2014  . Depression 07/18/2013  . Malignant neoplasm of upper-outer quadrant of right breast in female, estrogen receptor positive ([place]) 07/15/2013  . Encounter for therapeutic drug monitoring 07/05/2013  . Overactive bladder 01/30/2013  . Primary hypercoagulable state ([place]) 12/19/2012  . HTN (hypertension) 09/27/2011  . Hearing loss 09/27/2011  . Seasonal allergies 09/27/2011  . SOB (shortness of breath), related to deconditioning 08/26/2011  . Hypothyroid 08/26/2011   [name]. Owens Shark PT  [name] 12/27/2016, 3:24 PM  [place], [name], [address] Phone: [phone]   Fax:  [phone]  Name: Alicia Vasquez MRN: [id number] Date of Birth: 10/10/1940

## 2016-12-29 ENCOUNTER — Telehealth: Payer: Self-pay | Admitting: Family Medicine

## 2016-12-29 NOTE — Telephone Encounter (Signed)
Left pt message asking to call Allison back directly at [phone] to schedule AWV. Thanks! ° °*NOTE* Never had AWV before °

## 2016-12-30 ENCOUNTER — Ambulatory Visit: Payer: Medicare Other | Attending: Adult Health | Admitting: Physical Therapy

## 2016-12-30 DIAGNOSIS — Z9181 History of falling: Secondary | ICD-10-CM

## 2016-12-30 DIAGNOSIS — M5442 Lumbago with sciatica, left side: Secondary | ICD-10-CM | POA: Insufficient documentation

## 2016-12-30 DIAGNOSIS — R5381 Other malaise: Secondary | ICD-10-CM | POA: Diagnosis not present

## 2016-12-30 NOTE — Therapy (Signed)
[place], [name], [address] Phone: [phone]   Fax:  [phone]  Physical Therapy Treatment  Patient Details  Name: Alicia Vasquez MRN: [id number] Date of Birth: September 12, 1940 Referring Provider: Wilber Bihari   Encounter Date: 12/30/2016      PT End of Session - 12/30/16 1122    Visit Number 23  kx   Number of Visits 30   Date for PT Re-Evaluation 01/27/17   PT Start Time 1105   Activity Tolerance Patient tolerated treatment well   Behavior During Therapy [facility] for tasks assessed/performed      Past Medical History:  Diagnosis Date  . Allergy   . Anxiety   . Arthritis   . Blood transfusion without reported diagnosis   . Breast cancer ([place]) 07/12/2013   Invasive Mammary Carcinoma  . DVT (deep vein thrombosis) in pregnancy ([place])   . Hypertension   . Hypothyroid   . Personal history of radiation therapy   . Pneumonia   . Radiation 11/21/13-01/07/14   Right Breast/Supraclavicular    Past Surgical History:  Procedure Laterality Date  . BREAST LUMPECTOMY WITH RADIOACTIVE SEED LOCALIZATION Right 09/09/2013   Procedure: BREAST LUMPECTOMY WITH RADIOACTIVE SEED LOCALIZATION WITH AXILLARY NODE EXCISION;  Surgeon: [name], MD;  Location: [facility];  Service: General;  Laterality: Right;  . DENTAL SURGERY  04/19/2012   13 TEETH REMOVED  . DILATION AND CURETTAGE OF UTERUS    . RE-EXCISION OF BREAST LUMPECTOMY Right 09/24/2013   Procedure: RE-EXCISION OF RIGHT BREAST LUMPECTOMY;  Surgeon: [name], MD;  Location: [facility];  Service: General;  Laterality: Right;    There were no vitals filed for this visit.      Subjective Assessment - 12/30/16 1108    Subjective pt says Dr. Gladstone Lighter thinks she may have a something like a hairline fracture in her left hip.  Her MRI got rescheduled til next Tuesday  and she goes back to see Sr. Gladstone Lighter on Aug 13 for  results.  Pt says she did not sleep all night because of pain in her lower  back and knee.  She also has numbness and pain in her ankle and her ankle is swollen Pt is concerned that back may not be included in her MRI and she may call the doctor back to make sure it is.    Pertinent History breast cancer in 2015 with lumpectomy and 4 sentinel nodes with second surgery to get clear margins.  She underwent  radiation.  Multiple complaints of pain in back, left legs neck right  shoulder and arm. She had a near fall in December and twisted her back and caught herself with right arm and has pain since. Pt reports she has not followed up with community exercise program   Patient Stated Goals get some relief from the pain    Currently in Pain? Yes   Pain Score 8    Pain Location Hip  progresses to left leg    Pain Orientation Left   Pain Descriptors / Indicators Sharp   Pain Radiating Towards radiates down the left leg, getting worse    Pain Onset More than a month ago   Pain Frequency Intermittent   Aggravating Factors  has a really hard time getting in and out of bed    Pain Relieving Factors physical therapy and tylenol  [facility] Adult PT Treatment/Exercise - 12/30/16 0001      Lumbar Exercises: Seated   Other Seated Lumbar Exercises worked on seated lumbar flexion and extension     Moist Heat Therapy   Number Minutes Moist Heat 20 Minutes   Moist Heat Location Lumbar Spine     Manual Therapy   Soft tissue mobilization with biotone, to low back  area iin seated postion leaning forward on mat for lumbar stretch   deep pressure to left paraspinals and trigger points                         Long Term Clinic Goals - 12/22/16 1501      CC Long Term Goal  #1   Title Pt will report a 50% reduction in low back pain    Baseline pt states she still has pain in her back (and also in her hip and knees0    Status On-going     CC Long Term  Goal  #2   Title Pt will be independent in an exercise program to help with low back pain    Baseline pt  has not been able to follow up at home with exercise    Status On-going     CC Long Term Goal  #3   Title Pt will have a decrease in Quick DASH to < 38 demonstrating functional improvment of right arm     Baseline 52.27 o 09/29/2016, 25 on 12/22/2016    Status Achieved     CC Long Term Goal  #4   Title Pt will be independent in sit to supine and return.    Baseline now needs mod assist due to pain    Time 4   Period Weeks   Status On-going     CC Long Term Goal  #5   Title Pt will increase repetitions fo sit to stand in 30 seconds to 8 demonstrating and increase in generalized strength and function   Baseline 6 in 30 seconds limited by leg pain   Period Weeks   Status On-going            Plan - 12/30/16 1122    Clinical Impression Statement Pt says the heat and massage is the first thing that has helped her with the pain. She feels better at the end of treatment with cues for more back extension as she walks.  Overall, she continues to struggle and has great difficulty getting in and out of bed. She thinks she is getting worse. She is not able to tolerate much exercise due to the pain There is visible edema in left proximal medial lower leg just below knee She is in the process of evaluation from orthopedic surgeon   Rehab Potential Fair   PT Next Visit Plan Continue moist heat and soft tissue work, Add lumbar extension and mobility  exercises, gait training progressing to cane and no device       Patient will benefit from skilled therapeutic intervention in order to improve the following deficits and impairments:  Obesity, Increased edema, Increased fascial restricitons, Impaired UE functional use, Pain, Decreased mobility, Difficulty walking, Increased muscle spasms, Decreased range of motion, Impaired perceived functional ability, Postural dysfunction, Impaired  flexibility  Visit Diagnosis: Physical deconditioning  Low back pain with left-sided sciatica, unspecified back pain laterality, unspecified chronicity  Risk for falls     Problem List Patient Active Problem List   Diagnosis Date Noted  .  Lymphedema 09/17/2015  . Long term current use of anticoagulant therapy 08/23/2015  . DVT, lower extremity ([place]) 06/18/2015  . Bilateral knee pain 04/03/2015  . Arm edema 08/28/2014  . Vitamin D deficiency 04/23/2014  . Hyperparathyroidism ([place]) 04/23/2014  . Depression 07/18/2013  . Malignant neoplasm of upper-outer quadrant of right breast in female, estrogen receptor positive ([place]) 07/15/2013  . Encounter for therapeutic drug monitoring 07/05/2013  . Overactive bladder 01/30/2013  . Primary hypercoagulable state ([place]) 12/19/2012  . HTN (hypertension) 09/27/2011  . Hearing loss 09/27/2011  . Seasonal allergies 09/27/2011  . SOB (shortness of breath), related to deconditioning 08/26/2011  . Hypothyroid 08/26/2011   [name]. Owens Shark PT  [name] 12/30/2016, 11:50 AM  [place], [name], [address] Phone: [phone]   Fax:  [phone]  Name: Alicia Vasquez MRN: [id number] Date of Birth: [birth year]

## 2016-12-31 ENCOUNTER — Other Ambulatory Visit: Payer: Medicare Other

## 2017-01-03 DIAGNOSIS — S72112D Displaced fracture of greater trochanter of left femur, subsequent encounter for closed fracture with routine healing: Secondary | ICD-10-CM | POA: Diagnosis not present

## 2017-01-06 ENCOUNTER — Ambulatory Visit: Payer: Medicare Other | Admitting: Physical Therapy

## 2017-01-06 ENCOUNTER — Telehealth: Payer: Self-pay | Admitting: Family Medicine

## 2017-01-06 ENCOUNTER — Ambulatory Visit (INDEPENDENT_AMBULATORY_CARE_PROVIDER_SITE_OTHER): Payer: Medicare Other | Admitting: Family Medicine

## 2017-01-06 ENCOUNTER — Encounter: Payer: Self-pay | Admitting: Family Medicine

## 2017-01-06 VITALS — BP 144/86 | HR 78 | Temp 98.1°F

## 2017-01-06 DIAGNOSIS — Z9181 History of falling: Secondary | ICD-10-CM

## 2017-01-06 DIAGNOSIS — R300 Dysuria: Secondary | ICD-10-CM | POA: Diagnosis not present

## 2017-01-06 DIAGNOSIS — R5381 Other malaise: Secondary | ICD-10-CM

## 2017-01-06 DIAGNOSIS — M5442 Lumbago with sciatica, left side: Secondary | ICD-10-CM

## 2017-01-06 LAB — POCT URINALYSIS DIPSTICK
Bilirubin, UA: NEGATIVE
Blood, UA: NEGATIVE
Glucose, UA: NEGATIVE
Ketones, UA: NEGATIVE
Nitrite, UA: NEGATIVE
Protein, UA: NEGATIVE
Spec Grav, UA: 1.015 (ref 1.010–1.025)
Urobilinogen, UA: 0.2 E.U./dL
pH, UA: 7.5 (ref 5.0–8.0)

## 2017-01-06 MED ORDER — CIPROFLOXACIN HCL 250 MG PO TABS: 250.0000 mg | ORAL_TABLET | Freq: Two times a day (BID) | ORAL | 0 refills | Status: AC

## 2017-01-06 NOTE — Therapy (Signed)
[place], [name], [address] Phone: [phone]   Fax:  [phone]  Physical Therapy Treatment  Patient Details  Name: Alicia Vasquez MRN: [id number] Date of Birth: 12/15/1940 Referring Provider: Wilber Bihari   Encounter Date: 01/06/2017      PT End of Session - 01/06/17 1250    Visit Number 24  kx   Number of Visits 30   Date for PT Re-Evaluation 01/27/17   PT Start Time 1105   PT Stop Time 1145   PT Time Calculation (min) 40 min   Activity Tolerance Patient tolerated treatment well;Patient limited by pain   Behavior During Therapy [facility] for tasks assessed/performed      Past Medical History:  Diagnosis Date  . Allergy   . Anxiety   . Arthritis   . Blood transfusion without reported diagnosis   . Breast cancer ([place]) 07/12/2013   Invasive Mammary Carcinoma  . DVT (deep vein thrombosis) in pregnancy ([place])   . Hypertension   . Hypothyroid   . Personal history of radiation therapy   . Pneumonia   . Radiation 11/21/13-01/07/14   Right Breast/Supraclavicular    Past Surgical History:  Procedure Laterality Date  . BREAST LUMPECTOMY WITH RADIOACTIVE SEED LOCALIZATION Right 09/09/2013   Procedure: BREAST LUMPECTOMY WITH RADIOACTIVE SEED LOCALIZATION WITH AXILLARY NODE EXCISION;  Surgeon: [name], MD;  Location: [facility];  Service: General;  Laterality: Right;  . DENTAL SURGERY  04/19/2012   13 TEETH REMOVED  . DILATION AND CURETTAGE OF UTERUS    . RE-EXCISION OF BREAST LUMPECTOMY Right 09/24/2013   Procedure: RE-EXCISION OF RIGHT BREAST LUMPECTOMY;  Surgeon: [name], MD;  Location: [facility];  Service: General;  Laterality: Right;    There were no vitals filed for this visit.      Subjective Assessment - 01/06/17 1114    Subjective Pt had her MRI on Tuesday and she has been hurting ever since in groin and back. She feels that she has  swelling in her left knee also.  She has difficulty getting in and out of bed    Pertinent History breast cancer in 2015 with lumpectomy and 4 sentinel nodes with second surgery to get clear margins.  She underwent  radiation.  Multiple complaints of pain in back, left legs neck right  shoulder and arm. She had a near fall in December and twisted her back and caught herself with right arm and has pain since. Pt reports she has not followed up with community exercise program   Patient Stated Goals get some relief from the pain    Currently in Pain? Yes   Pain Score 10-Worst pain ever  decreases to 4 or 5 when she is sitting    Pain Location Groin   Pain Orientation Left                         [facility] Adult PT Treatment/Exercise - 01/06/17 0001      Ambulation/Gait   Pre-Gait Activities standing and weight shift  with abdominal and glute sets      Lumbar Exercises: Seated   Other Seated Lumbar Exercises worked on seated lumbar flexion and extension     Moist Heat Therapy   Number Minutes Moist Heat 20 Minutes   Moist Heat Location Lumbar Spine     Manual Therapy   Soft tissue mobilization with biotone, to low back  area iin  seated postion leaning forward on mat for lumbar stretch   deep pressure to left paraspinals and trigger points                         Long Term Clinic Goals - 12/22/16 1501      CC Long Term Goal  #1   Title Pt will report a 50% reduction in low back pain    Baseline pt states she still has pain in her back (and also in her hip and knees0    Status On-going     CC Long Term Goal  #2   Title Pt will be independent in an exercise program to help with low back pain    Baseline pt  has not been able to follow up at home with exercise    Status On-going     CC Long Term Goal  #3   Title Pt will have a decrease in Quick DASH to < 38 demonstrating functional improvment of right arm     Baseline 52.27 o 09/29/2016, 25 on 12/22/2016     Status Achieved     CC Long Term Goal  #4   Title Pt will be independent in sit to supine and return.    Baseline now needs mod assist due to pain    Time 4   Period Weeks   Status On-going     CC Long Term Goal  #5   Title Pt will increase repetitions fo sit to stand in 30 seconds to 8 demonstrating and increase in generalized strength and function   Baseline 6 in 30 seconds limited by leg pain   Period Weeks   Status On-going            Plan - 01/06/17 1251    Clinical Impairments Affecting Rehab Potential previous radiation, 4 nodes removed,  chronic pain limiting her activities    PT Frequency 2x / week   PT Duration 4 weeks   PT Treatment/Interventions Manual techniques;ADLs/Self Care Home Management;Patient/family education;Orthotic Fit/Training;Taping;Electrical Stimulation;Therapeutic activities;Therapeutic exercise;Passive range of motion;Scar mobilization;Manual lymph drainage;Compression bandaging;Moist Heat;Neuromuscular re-education;Gait training;Stair training   PT Next Visit Plan Continue moist heat and soft tissue work, Add lumbar extension and mobility  exercises, gait training progressing to cane and no device    Consulted and Agree with Plan of Care Patient   PT Plan Pt continues to demonstrate functional decline with slower and more diffiuclty walking and transitional movements.  She continues with multiple complaints and does have visible swelling below left medial knee due to ??.  She reports relief from treatment and has palpable decrease in muscle spasm with better ability to walk , but report the benefit is temporary.  she plans to get MRI results on Monday       Patient will benefit from skilled therapeutic intervention in order to improve the following deficits and impairments:     Visit Diagnosis: Physical deconditioning  Low back pain with left-sided sciatica, unspecified back pain laterality, unspecified chronicity  Risk for  falls     Problem List Patient Active Problem List   Diagnosis Date Noted  . Lymphedema 09/17/2015  . Long term current use of anticoagulant therapy 08/23/2015  . DVT, lower extremity ([place]) 06/18/2015  . Bilateral knee pain 04/03/2015  . Arm edema 08/28/2014  . Vitamin D deficiency 04/23/2014  . Hyperparathyroidism ([place]) 04/23/2014  . Depression 07/18/2013  . Malignant neoplasm of upper-outer quadrant of right breast in female,  estrogen receptor positive ([place]) 07/15/2013  . Encounter for therapeutic drug monitoring 07/05/2013  . Overactive bladder 01/30/2013  . Primary hypercoagulable state ([place]) 12/19/2012  . HTN (hypertension) 09/27/2011  . Hearing loss 09/27/2011  . Seasonal allergies 09/27/2011  . SOB (shortness of breath), related to deconditioning 08/26/2011  . Hypothyroid 08/26/2011   [name]. Owens Shark PT  [name] 01/06/2017, 12:54 PM  [place], [name], [address] Phone: [phone]   Fax:  [phone]  Name: SACHE SANE MRN: [id number] Date of Birth: Mar 17, 1941

## 2017-01-06 NOTE — Telephone Encounter (Signed)
Noted  

## 2017-01-06 NOTE — Progress Notes (Signed)
Alicia Vasquez is a 76 y.o. female here for an acute visit.  History of Present Illness:   Water quality scientist, CMA, acting as scribe for Dr. Juleen China.  HPI: Patient comes in today for pain and blood with urination. The symptoms has been going on for 1 week.  She states she is having moderate intermittent pain in her lower abdomen.  She is using a wheelchair today due to low back and hip pain.  Coumadin level is at goal, per patient.  States that due to the pain, she cannot make it to the toilet on time.  States she recently had an MRI of her hip but we do not have those records available to Alicia Vasquez at this time.  PMHx, SurgHx, SocialHx, Medications, and Allergies were reviewed in the Visit Navigator and updated as appropriate.  Current Medications:   .  acetaminophen (TYLENOL) 500 MG tablet, Take 500 mg by mouth every 6 (six) hours as needed for mild pain, fever or headache. Reported on 09/08/2015, Disp: , Rfl:  .  Cholecalciferol (VITAMIN D3) 5000 UNITS TABS, Take by mouth., Disp: , Rfl:  .  Diclofenac Sodium (PENNSAID) 2 % SOLN, Place 1 application onto the skin daily., Disp: 8 g, Rfl: 0 .  gabapentin (NEURONTIN) 100 MG capsule, Take 1 capsule (100 mg total) by mouth at bedtime., Disp: 90 capsule, Rfl: 3 .  HYDROcodone-acetaminophen (NORCO) 5-325 MG tablet, Take 1 tablet by mouth every 6 (six) hours as needed for moderate pain., Disp: 12 tablet, Rfl: 0 .  levothyroxine (SYNTHROID) 125 MCG tablet, Take 1 tablet (125 mcg total) by mouth daily before breakfast. Dispense as written: Synthroid LAST REFILL, Disp: 90 tablet, Rfl: 3 .  lisinopril (PRINIVIL,ZESTRIL) 10 MG tablet, TAKE 1 TABLET (10 MG TOTAL) BY MOUTH DAILY., Disp: 90 tablet, Rfl: 1 .  warfarin (JANTOVEN) 5 MG tablet, TAKE AS DIRECTED BY ANTICOAGULATION CLINIC, Disp: 60 tablet, Rfl: 3   Allergies  Allergen Reactions  . Anesthetics, Amide Hypertension  . Benadryl [Diphenhydramine Hcl] Other (See Comments)    Dizziness  . Carbocaine  [Mepivacaine Hcl] Hypertension  . Codeine Other (See Comments)    Dizziness  . Epinephrine Hypertension  . Sulfa Antibiotics Other (See Comments)    dizziness  . Tramadol     Sedation.   Marland Kitchen Penicillins Rash   Review of Systems:   Pertinent items are noted in the HPI. Otherwise, ROS is negative.  Vitals:   Vitals:   01/06/17 1535  BP: (!) 144/86  Pulse: 78  Temp: 98.1 F (36.7 C)  TempSrc: Oral  SpO2: 96%     There is no height or weight on file to calculate BMI.  Physical Exam:   Physical Exam  Constitutional: She appears well-nourished.  HENT:  Head: Normocephalic and atraumatic.  Eyes: Pupils are equal, round, and reactive to light. EOM are normal.  Neck: Normal range of motion. Neck supple.  Cardiovascular: Normal rate, regular rhythm, normal heart sounds and intact distal pulses.   Pulmonary/Chest: Effort normal.  Abdominal: Soft.  Skin: Skin is warm.  Psychiatric: She has a normal mood and affect. Her behavior is normal.  Nursing note and vitals reviewed.   Results for orders placed or performed in visit on 01/06/17  POCT urinalysis dipstick  Result Value Ref Range   Color, UA Yellow    Clarity, UA Cloudy    Glucose, UA Negative    Bilirubin, UA Negative    Ketones, UA Negative    Spec Grav, UA 1.015  1.010 - 1.025   Blood, UA Negative    pH, UA 7.5 5.0 - 8.0   Protein, UA Negative    Urobilinogen, UA 0.2 0.2 or 1.0 E.U./dL   Nitrite, UA Negative    Leukocytes, UA Trace (A) Negative   Assessment and Plan:   Carla was seen today for dysuria.  Diagnoses and all orders for this visit:  Dysuria -     POCT urinalysis dipstick -     ciprofloxacin (CIPRO) 250 MG tablet; Take 1 tablet (250 mg total) by mouth 2 (two) times daily. -     Urine Culture   . Reviewed expectations re: course of current medical issues. . Discussed self-management of symptoms. . Outlined signs and symptoms indicating need for more acute intervention. . Patient verbalized  understanding and all questions were answered. Marland Kitchen Health Maintenance issues including appropriate healthy diet, exercise, and smoking avoidance were discussed with patient. . See orders for this visit as documented in the electronic medical record. . Patient received an After Visit Summary.  CMA served as Education administrator during this visit. History, Physical, and Plan performed by medical provider. The above documentation has been reviewed and is accurate and complete. Briscoe Deutscher, D.O.  Briscoe Deutscher, DO [place], [place] 01/07/2017  Future Appointments Date Time Provider [place]  01/10/2017 2:30 PM Kipp Laurence, PT [facility] None  01/12/2017 2:30 PM [facility] 1 [facility] None  01/17/2017 1:45 PM Kipp Laurence, PT [facility] None  01/19/2017 2:15 PM Magrinat, [name], MD [facility] None  01/19/2017 4:00 PM Kipp Laurence, PT [facility] None  01/24/2017 1:45 PM Kipp Laurence, PT [facility] None  01/26/2017 1:45 PM Kipp Laurence, PT [facility] None  03/07/2017 2:15 PM Briscoe Deutscher, DO [facility] None  06/08/2017 3:00 PM Philemon Kingdom, MD [facility] None

## 2017-01-06 NOTE — Telephone Encounter (Signed)
Scheduled acute visit at 2:30pm for burning and blood when urinating.  -LL

## 2017-01-07 LAB — URINE CULTURE

## 2017-01-09 DIAGNOSIS — M5442 Lumbago with sciatica, left side: Secondary | ICD-10-CM | POA: Diagnosis not present

## 2017-01-09 DIAGNOSIS — M25552 Pain in left hip: Secondary | ICD-10-CM | POA: Diagnosis not present

## 2017-01-10 ENCOUNTER — Ambulatory Visit: Payer: Medicare Other | Admitting: Physical Therapy

## 2017-01-10 DIAGNOSIS — R5381 Other malaise: Secondary | ICD-10-CM

## 2017-01-10 DIAGNOSIS — Z9181 History of falling: Secondary | ICD-10-CM | POA: Diagnosis not present

## 2017-01-10 DIAGNOSIS — M5442 Lumbago with sciatica, left side: Secondary | ICD-10-CM | POA: Diagnosis not present

## 2017-01-10 NOTE — Therapy (Addendum)
[place], [name], [address] Phone: [phone]   Fax:  [phone]  Physical Therapy Treatment  Patient Details  Name: Alicia Vasquez MRN: [id number] Date of Birth: 22-Sep-1940 Referring Provider: Wilber Bihari   Encounter Date: 01/10/2017      PT End of Session - 01/10/17 1721    Visit Number 25  kx   Number of Visits 30   Date for PT Re-Evaluation 01/27/17   PT Start Time 5456  pt arrived late    PT Stop Time 1515   PT Time Calculation (min) 37 min   Activity Tolerance Patient tolerated treatment well      Past Medical History:  Diagnosis Date  . Allergy   . Anxiety   . Arthritis   . Blood transfusion without reported diagnosis   . Breast cancer ([place]) 07/12/2013   Invasive Mammary Carcinoma  . DVT (deep vein thrombosis) in pregnancy ([place])   . Hypertension   . Hypothyroid   . Personal history of radiation therapy   . Pneumonia   . Radiation 11/21/13-01/07/14   Right Breast/Supraclavicular    Past Surgical History:  Procedure Laterality Date  . BREAST LUMPECTOMY WITH RADIOACTIVE SEED LOCALIZATION Right 09/09/2013   Procedure: BREAST LUMPECTOMY WITH RADIOACTIVE SEED LOCALIZATION WITH AXILLARY NODE EXCISION;  Surgeon: [name], MD;  Location: [facility];  Service: General;  Laterality: Right;  . DENTAL SURGERY  04/19/2012   13 TEETH REMOVED  . DILATION AND CURETTAGE OF UTERUS    . RE-EXCISION OF BREAST LUMPECTOMY Right 09/24/2013   Procedure: RE-EXCISION OF RIGHT BREAST LUMPECTOMY;  Surgeon: [name], MD;  Location: [facility];  Service: General;  Laterality: Right;    There were no vitals filed for this visit.      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  She goes to see [name] tomorrow to see about a total hip replacement.  She also has a high  grade partial thickness  left hamstring tear at the ischial attachment.    Pertinent History breast cancer in 2015 with lumpectomy and 4 sentinel nodes with second surgery to get clear margins.  She underwent  radiation.  Multiple complaints of pain in back, left legs neck right  shoulder and arm. She had a near fall in December and twisted her back and caught herself with right arm and has pain since. Pt reports she has not followed up with community exercise program   Patient Stated Goals get some relief from the pain    Currently in Pain? Yes   Pain Score 10-Worst pain ever  with movement    Pain Location Groin   Pain Orientation Left   Pain Descriptors / Indicators Sharp   Pain Type Chronic pain   Pain Radiating Towards radiaties down the left leg, getting worse.   Pain Onset More than a month ago   Pain Frequency Intermittent   Aggravating Factors  movment , especailly when getting out of bed and in and out of the car                          [facility] Adult PT Treatment/Exercise - 01/10/17 0001      Moist Heat Therapy   Number Minutes Moist Heat 20 Minutes   Moist Heat Location Lumbar Spine  Manual Therapy   Soft tissue mobilization with biotone, to low back  area iin seated postion leaning forward on mat for lumbar stretch   deep pressure to left paraspinals and trigger points                         Long Term Clinic Goals - 12/22/16 1501      CC Long Term Goal  #1   Title Pt will report a 50% reduction in low back pain    Baseline pt states she still has pain in her back (and also in her hip and knees0    Status On-going     CC Long Term Goal  #2   Title Pt will be independent in an exercise program to help with low back pain    Baseline pt  has not been able to follow up at home with exercise    Status On-going     CC Long Term Goal  #3   Title Pt will have a decrease in Quick DASH to < 38 demonstrating functional improvment of right  arm     Baseline 52.27 o 09/29/2016, 25 on 12/22/2016    Status Achieved     CC Long Term Goal  #4   Title Pt will be independent in sit to supine and return.    Baseline now needs mod assist due to pain    Time 4   Period Weeks   Status On-going     CC Long Term Goal  #5   Title Pt will increase repetitions fo sit to stand in 30 seconds to 8 demonstrating and increase in generalized strength and function   Baseline 6 in 30 seconds limited by leg pain   Period Weeks   Status On-going            Plan - 01/10/17 1503    Clinical Impression Statement pt brings in MRI report but it cannot be scanned into the chart  She said she will take it to the cancer center on Thursday.  She is visibly upset about her situation but understands this is the cause of her pain.  She will go to see [name] tomorrow about possible surgery    Rehab Potential Fair   Clinical Impairments Affecting Rehab Potential previous radiation, 4 nodes removed,  chronic pain limiting her activities    PT Frequency 2x / week   PT Duration 4 weeks   PT Next Visit Plan Reassess  Discharge if pt is to have surgery.    Consulted and Agree with Plan of Care Patient   PT Plan --      Patient will benefit from skilled therapeutic intervention in order to improve the following deficits and impairments:  Obesity, Increased edema, Increased fascial restricitons, Impaired UE functional use, Pain, Decreased mobility, Difficulty walking, Increased muscle spasms, Decreased range of motion, Impaired perceived functional ability, Postural dysfunction, Impaired flexibility  Visit Diagnosis: Physical deconditioning  Low back pain with left-sided sciatica, unspecified back pain laterality, unspecified chronicity  Risk for falls     Problem List Patient Active Problem List   Diagnosis Date Noted  . Lymphedema 09/17/2015  . Long term current use of anticoagulant therapy 08/23/2015  . DVT, lower extremity ([place]) 06/18/2015  .  Bilateral knee pain 04/03/2015  . Arm edema 08/28/2014  . Vitamin D deficiency 04/23/2014  . Hyperparathyroidism ([place]) 04/23/2014  . Depression 07/18/2013  . Malignant neoplasm of upper-outer quadrant of  right breast in female, estrogen receptor positive ([place]) 07/15/2013  . Encounter for therapeutic drug monitoring 07/05/2013  . Overactive bladder 01/30/2013  . Primary hypercoagulable state ([place]) 12/19/2012  . HTN (hypertension) 09/27/2011  . Hearing loss 09/27/2011  . Seasonal allergies 09/27/2011  . SOB (shortness of breath), related to deconditioning 08/26/2011  . Hypothyroid 08/26/2011   [name]. Owens Shark, PT  [name] 01/10/2017, 5:26 PM  [place], [name], [address] Phone: [phone]   Fax:  [phone]  Name: WANETTA FUNDERBURKE MRN: [id number] Date of Birth: [birth year]  PHYSICAL THERAPY DISCHARGE SUMMARY  Visits from Start of Care: 26  Current functional level related to goals / functional outcomes: unknown   Remaining deficits: unknown   Education / Equipment: Home exercise  Plan: Patient agrees to discharge.  Patient goals were not met. Patient is being discharged due to not returning since the last visit.  ?????    Maudry Diego, PT 06/20/17 12:04 PM

## 2017-01-11 DIAGNOSIS — M898X5 Other specified disorders of bone, thigh: Secondary | ICD-10-CM | POA: Diagnosis not present

## 2017-01-11 DIAGNOSIS — M25552 Pain in left hip: Secondary | ICD-10-CM | POA: Diagnosis not present

## 2017-01-12 ENCOUNTER — Other Ambulatory Visit (HOSPITAL_BASED_OUTPATIENT_CLINIC_OR_DEPARTMENT_OTHER): Payer: Medicare Other

## 2017-01-12 DIAGNOSIS — I82403 Acute embolism and thrombosis of unspecified deep veins of lower extremity, bilateral: Secondary | ICD-10-CM | POA: Diagnosis present

## 2017-01-12 DIAGNOSIS — Z170 Estrogen receptor positive status [ER+]: Principal | ICD-10-CM

## 2017-01-12 DIAGNOSIS — M8580 Other specified disorders of bone density and structure, unspecified site: Secondary | ICD-10-CM

## 2017-01-12 DIAGNOSIS — C50411 Malignant neoplasm of upper-outer quadrant of right female breast: Secondary | ICD-10-CM

## 2017-01-12 LAB — CBC WITH DIFFERENTIAL/PLATELET
BASO%: 1 % (ref 0.0–2.0)
Basophils Absolute: 0.1 10*3/uL (ref 0.0–0.1)
EOS%: 1.8 % (ref 0.0–7.0)
Eosinophils Absolute: 0.1 10*3/uL (ref 0.0–0.5)
HEMATOCRIT: 41.1 % (ref 34.8–46.6)
HEMOGLOBIN: 13.6 g/dL (ref 11.6–15.9)
LYMPH#: 1.5 10*3/uL (ref 0.9–3.3)
LYMPH%: 24.1 % (ref 14.0–49.7)
MCH: 31.4 pg (ref 25.1–34.0)
MCHC: 33.1 g/dL (ref 31.5–36.0)
MCV: 94.9 fL (ref 79.5–101.0)
MONO#: 0.7 10*3/uL (ref 0.1–0.9)
MONO%: 11.7 % (ref 0.0–14.0)
NEUT#: 3.7 10*3/uL (ref 1.5–6.5)
NEUT%: 61.4 % (ref 38.4–76.8)
PLATELETS: 218 10*3/uL (ref 145–400)
RBC: 4.33 10*6/uL (ref 3.70–5.45)
RDW: 13.9 % (ref 11.2–14.5)
WBC: 6.1 10*3/uL (ref 3.9–10.3)
nRBC: 0 % (ref 0–0)

## 2017-01-12 LAB — PROTIME-INR
INR: 1.2 — AB (ref 2.00–3.50)
Protime: 14.4 Seconds — ABNORMAL HIGH (ref 10.6–13.4)

## 2017-01-13 ENCOUNTER — Telehealth: Payer: Self-pay | Admitting: *Deleted

## 2017-01-13 ENCOUNTER — Telehealth: Payer: Self-pay | Admitting: Family Medicine

## 2017-01-13 NOTE — Telephone Encounter (Signed)
This RN spoke with Alicia Vasquez per INR of 1.2  Swathi states she " skipped a couple of days because I was on an antibiotic for a UTI "  Bret states she restarted the coumadin yesterday " I took 2 tablets " - but does not know what the tablet dose is. She is currently not at home and does not have the bottle. She will not be home until later today but will call with dose of her current bottle of warfarin.  Per records she was dispensed 5 mg tablets per last refill by this office.  Alicia Vasquez is scheduled to see Dr Jannifer Rodney next week for routine follow up.

## 2017-01-13 NOTE — Telephone Encounter (Signed)
MEDICATION: ciprofloxacin (CIPRO) 250 MG tablet  PHARMACY:  [facility] # [place], [place] [phone] (Phone) [phone] (Fax)   IS THIS A 90 DAY SUPPLY : no  IS PATIENT OUT OF MEDICTAION: no  IF NOT; HOW MUCH IS LEFT: 2 pills  LAST APPOINTMENT DATE:01/06/17  NEXT APPOINTMENT DATE:03/07/17  OTHER COMMENTS: Patient still has the same symptoms as last visit  Also, patient would like to know if Dr. Juleen China received her MRI results from another office. Please advise.   **Let patient know to contact pharmacy at the end of the day to make sure medication is ready. **  ** Please notify patient to allow 48-72 hours to process**  **Encourage patient to contact the pharmacy for refills or they can request refills through [facility]**

## 2017-01-13 NOTE — Telephone Encounter (Signed)
Spoke with patient and she stated that she is still having the same symptoms that she was having at last visit. She is having a hard time getting to the bathroom in time. She has one more day left of the Cipro and wanted to know if she needs another round of antibiotic. Urine culture is back.

## 2017-01-13 NOTE — Telephone Encounter (Signed)
I have tried to call the patient three times and phone says Due to network difficulty call cannot be connected. I checked with front staff and our phones are fine.

## 2017-01-13 NOTE — Telephone Encounter (Signed)
UCx did not grow true UTI. I think that we discussed Urology already.

## 2017-01-13 NOTE — Telephone Encounter (Signed)
Notified patient of the message. She does not want to see Urology at this time. She will wait until she has her surgery with orthopedics.

## 2017-01-15 ENCOUNTER — Other Ambulatory Visit: Payer: Self-pay | Admitting: Oncology

## 2017-01-15 ENCOUNTER — Emergency Department (HOSPITAL_COMMUNITY): Payer: Medicare Other

## 2017-01-15 ENCOUNTER — Encounter (HOSPITAL_COMMUNITY): Payer: Self-pay | Admitting: Emergency Medicine

## 2017-01-15 ENCOUNTER — Inpatient Hospital Stay (HOSPITAL_COMMUNITY)
Admission: EM | Admit: 2017-01-15 | Discharge: 2017-01-21 | DRG: 470 | Disposition: A | Discharge status: Skilled Nursing Facility | Payer: Medicare Other | Attending: Family Medicine | Admitting: Family Medicine

## 2017-01-15 DIAGNOSIS — K5900 Constipation, unspecified: Secondary | ICD-10-CM | POA: Diagnosis not present

## 2017-01-15 DIAGNOSIS — R791 Abnormal coagulation profile: Secondary | ICD-10-CM | POA: Diagnosis present

## 2017-01-15 DIAGNOSIS — E213 Hyperparathyroidism, unspecified: Secondary | ICD-10-CM | POA: Diagnosis present

## 2017-01-15 DIAGNOSIS — N390 Urinary tract infection, site not specified: Secondary | ICD-10-CM | POA: Diagnosis not present

## 2017-01-15 DIAGNOSIS — Z7901 Long term (current) use of anticoagulants: Secondary | ICD-10-CM

## 2017-01-15 DIAGNOSIS — M84559A Pathological fracture in neoplastic disease, hip, unspecified, initial encounter for fracture: Secondary | ICD-10-CM | POA: Diagnosis not present

## 2017-01-15 DIAGNOSIS — M80052A Age-related osteoporosis with current pathological fracture, left femur, initial encounter for fracture: Secondary | ICD-10-CM | POA: Diagnosis not present

## 2017-01-15 DIAGNOSIS — C801 Malignant (primary) neoplasm, unspecified: Secondary | ICD-10-CM | POA: Diagnosis not present

## 2017-01-15 DIAGNOSIS — N8500 Endometrial hyperplasia, unspecified: Secondary | ICD-10-CM

## 2017-01-15 DIAGNOSIS — I10 Essential (primary) hypertension: Secondary | ICD-10-CM | POA: Diagnosis present

## 2017-01-15 DIAGNOSIS — D509 Iron deficiency anemia, unspecified: Secondary | ICD-10-CM | POA: Diagnosis not present

## 2017-01-15 DIAGNOSIS — Z888 Allergy status to other drugs, medicaments and biological substances status: Secondary | ICD-10-CM | POA: Diagnosis not present

## 2017-01-15 DIAGNOSIS — Z882 Allergy status to sulfonamides status: Secondary | ICD-10-CM | POA: Diagnosis not present

## 2017-01-15 DIAGNOSIS — C7951 Secondary malignant neoplasm of bone: Secondary | ICD-10-CM | POA: Diagnosis present

## 2017-01-15 DIAGNOSIS — Z884 Allergy status to anesthetic agent status: Secondary | ICD-10-CM | POA: Diagnosis not present

## 2017-01-15 DIAGNOSIS — M25559 Pain in unspecified hip: Secondary | ICD-10-CM | POA: Diagnosis not present

## 2017-01-15 DIAGNOSIS — C50919 Malignant neoplasm of unspecified site of unspecified female breast: Secondary | ICD-10-CM | POA: Diagnosis not present

## 2017-01-15 DIAGNOSIS — Z01818 Encounter for other preprocedural examination: Secondary | ICD-10-CM | POA: Diagnosis not present

## 2017-01-15 DIAGNOSIS — M899 Disorder of bone, unspecified: Secondary | ICD-10-CM

## 2017-01-15 DIAGNOSIS — Z79899 Other long term (current) drug therapy: Secondary | ICD-10-CM

## 2017-01-15 DIAGNOSIS — Z6835 Body mass index (BMI) 35.0-35.9, adult: Secondary | ICD-10-CM

## 2017-01-15 DIAGNOSIS — F419 Anxiety disorder, unspecified: Secondary | ICD-10-CM | POA: Diagnosis present

## 2017-01-15 DIAGNOSIS — M25552 Pain in left hip: Secondary | ICD-10-CM | POA: Diagnosis not present

## 2017-01-15 DIAGNOSIS — M84452A Pathological fracture, left femur, initial encounter for fracture: Secondary | ICD-10-CM | POA: Diagnosis not present

## 2017-01-15 DIAGNOSIS — Z96642 Presence of left artificial hip joint: Secondary | ICD-10-CM | POA: Diagnosis not present

## 2017-01-15 DIAGNOSIS — Z86718 Personal history of other venous thrombosis and embolism: Secondary | ICD-10-CM

## 2017-01-15 DIAGNOSIS — Z86711 Personal history of pulmonary embolism: Secondary | ICD-10-CM

## 2017-01-15 DIAGNOSIS — R278 Other lack of coordination: Secondary | ICD-10-CM | POA: Diagnosis not present

## 2017-01-15 DIAGNOSIS — C50411 Malignant neoplasm of upper-outer quadrant of right female breast: Secondary | ICD-10-CM | POA: Diagnosis not present

## 2017-01-15 DIAGNOSIS — M7989 Other specified soft tissue disorders: Secondary | ICD-10-CM | POA: Diagnosis not present

## 2017-01-15 DIAGNOSIS — D630 Anemia in neoplastic disease: Secondary | ICD-10-CM | POA: Diagnosis not present

## 2017-01-15 DIAGNOSIS — R8271 Bacteriuria: Secondary | ICD-10-CM

## 2017-01-15 DIAGNOSIS — E039 Hypothyroidism, unspecified: Secondary | ICD-10-CM | POA: Diagnosis present

## 2017-01-15 DIAGNOSIS — R2689 Other abnormalities of gait and mobility: Secondary | ICD-10-CM | POA: Diagnosis not present

## 2017-01-15 DIAGNOSIS — Z853 Personal history of malignant neoplasm of breast: Secondary | ICD-10-CM | POA: Diagnosis not present

## 2017-01-15 DIAGNOSIS — R0602 Shortness of breath: Secondary | ICD-10-CM | POA: Diagnosis not present

## 2017-01-15 DIAGNOSIS — Z923 Personal history of irradiation: Secondary | ICD-10-CM

## 2017-01-15 DIAGNOSIS — Z471 Aftercare following joint replacement surgery: Secondary | ICD-10-CM | POA: Diagnosis not present

## 2017-01-15 DIAGNOSIS — M84552A Pathological fracture in neoplastic disease, left femur, initial encounter for fracture: Principal | ICD-10-CM | POA: Diagnosis present

## 2017-01-15 DIAGNOSIS — Z170 Estrogen receptor positive status [ER+]: Secondary | ICD-10-CM | POA: Diagnosis not present

## 2017-01-15 DIAGNOSIS — C7981 Secondary malignant neoplasm of breast: Secondary | ICD-10-CM | POA: Diagnosis not present

## 2017-01-15 DIAGNOSIS — Z880 Allergy status to penicillin: Secondary | ICD-10-CM

## 2017-01-15 DIAGNOSIS — D62 Acute posthemorrhagic anemia: Secondary | ICD-10-CM | POA: Diagnosis not present

## 2017-01-15 DIAGNOSIS — Z79811 Long term (current) use of aromatase inhibitors: Secondary | ICD-10-CM | POA: Diagnosis not present

## 2017-01-15 DIAGNOSIS — K5730 Diverticulosis of large intestine without perforation or abscess without bleeding: Secondary | ICD-10-CM | POA: Diagnosis not present

## 2017-01-15 DIAGNOSIS — F329 Major depressive disorder, single episode, unspecified: Secondary | ICD-10-CM | POA: Diagnosis present

## 2017-01-15 DIAGNOSIS — S79911A Unspecified injury of right hip, initial encounter: Secondary | ICD-10-CM | POA: Diagnosis not present

## 2017-01-15 DIAGNOSIS — Z886 Allergy status to analgesic agent status: Secondary | ICD-10-CM | POA: Diagnosis not present

## 2017-01-15 DIAGNOSIS — R52 Pain, unspecified: Secondary | ICD-10-CM

## 2017-01-15 DIAGNOSIS — R935 Abnormal findings on diagnostic imaging of other abdominal regions, including retroperitoneum: Secondary | ICD-10-CM

## 2017-01-15 DIAGNOSIS — M84459A Pathological fracture, hip, unspecified, initial encounter for fracture: Secondary | ICD-10-CM

## 2017-01-15 DIAGNOSIS — R0600 Dyspnea, unspecified: Secondary | ICD-10-CM | POA: Diagnosis not present

## 2017-01-15 DIAGNOSIS — N83292 Other ovarian cyst, left side: Secondary | ICD-10-CM | POA: Diagnosis not present

## 2017-01-15 DIAGNOSIS — N83291 Other ovarian cyst, right side: Secondary | ICD-10-CM | POA: Diagnosis not present

## 2017-01-15 DIAGNOSIS — M84659P Pathological fracture in other disease, hip, unspecified, subsequent encounter for fracture with malunion: Secondary | ICD-10-CM | POA: Diagnosis not present

## 2017-01-15 LAB — CBC WITH DIFFERENTIAL/PLATELET
Basophils Absolute: 0 10*3/uL (ref 0.0–0.1)
Basophils Relative: 1 %
Eosinophils Absolute: 0.1 10*3/uL (ref 0.0–0.7)
Eosinophils Relative: 2 %
HCT: 41 % (ref 36.0–46.0)
HEMOGLOBIN: 13.4 g/dL (ref 12.0–15.0)
Lymphocytes Relative: 19 %
Lymphs Abs: 1.5 10*3/uL (ref 0.7–4.0)
MCH: 30.5 pg (ref 26.0–34.0)
MCHC: 32.7 g/dL (ref 30.0–36.0)
MCV: 93.4 fL (ref 78.0–100.0)
MONO ABS: 1.5 10*3/uL — AB (ref 0.1–1.0)
MONOS PCT: 18 %
NEUTROS ABS: 5 10*3/uL (ref 1.7–7.7)
Neutrophils Relative %: 60 %
Platelets: 222 10*3/uL (ref 150–400)
RBC: 4.39 MIL/uL (ref 3.87–5.11)
RDW: 13.9 % (ref 11.5–15.5)
WBC: 8.1 10*3/uL (ref 4.0–10.5)

## 2017-01-15 LAB — URINALYSIS, ROUTINE W REFLEX MICROSCOPIC
BILIRUBIN URINE: NEGATIVE
Glucose, UA: NEGATIVE mg/dL
Ketones, ur: NEGATIVE mg/dL
LEUKOCYTES UA: NEGATIVE
NITRITE: NEGATIVE
Protein, ur: NEGATIVE mg/dL
SPECIFIC GRAVITY, URINE: 1.008 (ref 1.005–1.030)
pH: 6 (ref 5.0–8.0)

## 2017-01-15 LAB — BASIC METABOLIC PANEL
Anion gap: 6 (ref 5–15)
BUN: 19 mg/dL (ref 6–20)
CHLORIDE: 108 mmol/L (ref 101–111)
CO2: 26 mmol/L (ref 22–32)
Calcium: 10.9 mg/dL — ABNORMAL HIGH (ref 8.9–10.3)
Creatinine, Ser: 0.75 mg/dL (ref 0.44–1.00)
GFR calc Af Amer: >60 mL/min (ref >60)
GFR calc non Af Amer: >60 mL/min (ref >60)
GLUCOSE: 103 mg/dL — AB (ref 65–99)
POTASSIUM: 5.1 mmol/L (ref 3.5–5.1)
Sodium: 140 mmol/L (ref 135–145)

## 2017-01-15 LAB — APTT: APTT: 48 s — AB (ref 24–36)

## 2017-01-15 LAB — PROTIME-INR
INR: 1.62
Prothrombin Time: 19.4 seconds — ABNORMAL HIGH (ref 11.4–15.2)

## 2017-01-15 MED ORDER — METHOCARBAMOL 1000 MG/10ML IJ SOLN
500.0000 mg | Freq: Four times a day (QID) | INTRAVENOUS | Status: DC | PRN
  Administered 2017-01-16: 500 mg via INTRAVENOUS
  Filled 2017-01-15: qty 550
  Filled 2017-01-15: qty 5

## 2017-01-15 MED ORDER — FENTANYL CITRATE (PF) 100 MCG/2ML IJ SOLN
25.0000 ug | INTRAMUSCULAR | Status: DC | PRN
  Administered 2017-01-16 (×2): 50 ug via INTRAVENOUS
  Administered 2017-01-16: 25 ug via INTRAVENOUS
  Administered 2017-01-16 (×2): 50 ug via INTRAVENOUS
  Administered 2017-01-16 (×2): 25 ug via INTRAVENOUS
  Administered 2017-01-16: 50 ug via INTRAVENOUS
  Administered 2017-01-16 (×2): 25 ug via INTRAVENOUS
  Administered 2017-01-16: 50 ug via INTRAVENOUS
  Administered 2017-01-19: 25 ug via INTRAVENOUS
  Filled 2017-01-15 (×2): qty 2

## 2017-01-15 MED ORDER — SODIUM CHLORIDE 0.9 % IV SOLN
INTRAVENOUS | Status: DC
  Administered 2017-01-16: 02:00:00 via INTRAVENOUS

## 2017-01-15 MED ORDER — LEVOTHYROXINE SODIUM 25 MCG PO TABS
125.0000 ug | ORAL_TABLET | Freq: Every day | ORAL | Status: DC
  Administered 2017-01-16 – 2017-01-21 (×6): 125 ug via ORAL
  Filled 2017-01-15 (×6): qty 1

## 2017-01-15 MED ORDER — ONDANSETRON HCL 4 MG/2ML IJ SOLN
4.0000 mg | Freq: Once | INTRAMUSCULAR | Status: AC
  Administered 2017-01-15: 4 mg via INTRAVENOUS
  Filled 2017-01-15: qty 2

## 2017-01-15 MED ORDER — METHOCARBAMOL 500 MG PO TABS
500.0000 mg | ORAL_TABLET | Freq: Four times a day (QID) | ORAL | Status: DC | PRN
  Administered 2017-01-16 – 2017-01-21 (×5): 500 mg via ORAL
  Filled 2017-01-15 (×5): qty 1

## 2017-01-15 MED ORDER — IOPAMIDOL (ISOVUE-300) INJECTION 61%
INTRAVENOUS | Status: AC
  Administered 2017-01-15: 100 mL via INTRAVENOUS
  Filled 2017-01-15: qty 100

## 2017-01-15 MED ORDER — LISINOPRIL 10 MG PO TABS: 10.0000 mg | ORAL_TABLET | Freq: Every day | ORAL | Status: DC

## 2017-01-15 MED ORDER — HYDROCODONE-ACETAMINOPHEN 5-325 MG PO TABS
0.5000 | ORAL_TABLET | Freq: Four times a day (QID) | ORAL | Status: DC | PRN
  Administered 2017-01-16: 0.5 via ORAL
  Filled 2017-01-15: qty 1

## 2017-01-15 MED ORDER — SODIUM CHLORIDE 0.9 % IV SOLN: INTRAVENOUS | Status: DC

## 2017-01-15 MED ORDER — MORPHINE SULFATE (PF) 2 MG/ML IV SOLN: 4.0000 mg | Freq: Once | INTRAVENOUS | Status: DC

## 2017-01-15 MED ORDER — MORPHINE SULFATE (PF) 4 MG/ML IV SOLN: 4.0000 mg | Freq: Once | INTRAVENOUS | Status: DC

## 2017-01-15 MED ORDER — FENTANYL CITRATE (PF) 100 MCG/2ML IJ SOLN
25.0000 ug | Freq: Once | INTRAMUSCULAR | Status: AC
Start: 2017-01-15 — End: 2017-01-15
  Administered 2017-01-15: 25 ug via INTRAVENOUS
  Filled 2017-01-15: qty 2

## 2017-01-15 NOTE — ED Triage Notes (Signed)
Patient is complaining of left hip pain. Patient states that she was able to walk yesterday but last night it got to were it was hurting to walk. Today she states she was not able to walk. Patient has a purple bruise on left hip.

## 2017-01-15 NOTE — ED Notes (Signed)
Bed: EQ33 Expected date: 01/15/17 Expected time: 7:04 PM Means of arrival: Ambulance Comments: Hip pain

## 2017-01-15 NOTE — ED Provider Notes (Signed)
[facility] DEPT Provider Note   CSN: [id number] Arrival date & time: 01/15/17  1903     History   Chief Complaint Chief Complaint  Patient presents with  . Hip Pain    HPI Alicia Vasquez is a 76 y.o. female.  HPI   Alicia Vasquez is a 76 y.o. female history of anxiety, breast cancer in remission, history of DVT on Coumadin, hypertension, hypothyroidism, presents to emergency room complaining of left hip pain.patient reports left hip pain for "months." she states that she recently has been followed by [facility] orthopedics and had an MRI done a few days ago of the left hip. MRI unfortunately showed a lesion in the left femoral head as well as some findings over her ovary and uterus. Findings are nonspecific but concerning for possible metastatic disease.patient states her breast cancer was 3 years ago, she underwent radiation therapy and lumpectomy. She also states that recently was diagnosed with urinary tract infection started on Cipro, she states that she had to skip a few doses of her Coumadin due to being on this antibiotic and her last INR was subtherapeutic. Patient states pain is mainly in the left hip and radiates down the entire leg into her toes. She reports numbness to the lateral left leg and numbness to bilateral feet and toes. She states the pain has been gradually getting worse, and since yesterday she has been able to walk. She denies any abdominal pain. She denies any nausea or vomiting. No chest pain or shortness of breath. Denies any trouble controlling her bowels. She does report urinary frequency despite being on antibiotic. She states she has to urinate every few minutes.   Past Medical History:  Diagnosis Date  . Allergy   . Anxiety   . Arthritis   . Blood transfusion without reported diagnosis   . Breast cancer ([place]) 07/12/2013   Invasive Mammary Carcinoma  . DVT (deep vein thrombosis) in pregnancy ([place])   . Hypertension   . Hypothyroid   . Personal  history of radiation therapy   . Pneumonia   . Radiation 11/21/13-01/07/14   Right Breast/Supraclavicular    Patient Active Problem List   Diagnosis Date Noted  . Lytic bone lesion of left femur 01/15/2017  . Lymphedema 09/17/2015  . Long term current use of anticoagulant therapy 08/23/2015  . DVT, lower extremity ([place]) 06/18/2015  . Bilateral knee pain 04/03/2015  . Arm edema 08/28/2014  . Vitamin D deficiency 04/23/2014  . Hyperparathyroidism ([place]) 04/23/2014  . Depression 07/18/2013  . Malignant neoplasm of upper-outer quadrant of right breast in female, estrogen receptor positive ([place]) 07/15/2013  . Encounter for therapeutic drug monitoring 07/05/2013  . Overactive bladder 01/30/2013  . Primary hypercoagulable state ([place]) 12/19/2012  . HTN (hypertension) 09/27/2011  . Hearing loss 09/27/2011  . Seasonal allergies 09/27/2011  . SOB (shortness of breath), related to deconditioning 08/26/2011  . Hypothyroid 08/26/2011    Past Surgical History:  Procedure Laterality Date  . BREAST LUMPECTOMY WITH RADIOACTIVE SEED LOCALIZATION Right 09/09/2013   Procedure: BREAST LUMPECTOMY WITH RADIOACTIVE SEED LOCALIZATION WITH AXILLARY NODE EXCISION;  Surgeon: [name], MD;  Location: [facility];  Service: General;  Laterality: Right;  . DENTAL SURGERY  04/19/2012   13 TEETH REMOVED  . DILATION AND CURETTAGE OF UTERUS    . RE-EXCISION OF BREAST LUMPECTOMY Right 09/24/2013   Procedure: RE-EXCISION OF RIGHT BREAST LUMPECTOMY;  Surgeon: [name], MD;  Location: [facility];  Service: General;  Laterality:  Right;    OB History    No data available       Home Medications    Prior to Admission medications   Medication Sig Start Date End Date Taking? Authorizing Provider  acetaminophen (TYLENOL) 500 MG tablet Take 500 mg by mouth every 6 (six) hours as needed for mild pain, fever or headache. Reported on 09/08/2015    [provider]    Cholecalciferol (VITAMIN D3) 5000 UNITS TABS Take by mouth.    [provider]  Diclofenac Sodium (PENNSAID) 2 % SOLN Place 1 application onto the skin daily. 12/05/16   Briscoe Deutscher, DO  gabapentin (NEURONTIN) 100 MG capsule Take 1 capsule (100 mg total) by mouth at bedtime. 12/19/16   Briscoe Deutscher, DO  HYDROcodone-acetaminophen (NORCO) 5-325 MG tablet Take 1 tablet by mouth every 6 (six) hours as needed for moderate pain. 11/15/16   [name], MD  levothyroxine (SYNTHROID) 125 MCG tablet Take 1 tablet (125 mcg total) by mouth daily before breakfast. Dispense as written: Synthroid LAST REFILL 04/15/16   [name], MD  lisinopril (PRINIVIL,ZESTRIL) 10 MG tablet TAKE 1 TABLET (10 MG TOTAL) BY MOUTH DAILY. 09/26/16   Magrinat, [name], MD  warfarin (JANTOVEN) 5 MG tablet TAKE AS DIRECTED BY ANTICOAGULATION CLINIC 11/11/16   Magrinat, [name], MD    Family History Family History  Problem Relation Age of Onset  . Heart disease Brother   . Colon cancer Brother   . Prostate cancer Brother     Social History Social History  Substance Use Topics  . Smoking status: Never Smoker  . Smokeless tobacco: Never Used  . Alcohol use No     Allergies   Anesthetics, amide; Benadryl [diphenhydramine hcl]; Carbocaine [mepivacaine hcl]; Codeine; Epinephrine; Sulfa antibiotics; Tramadol; and Penicillins   Review of Systems Review of Systems  Constitutional: Negative for chills and fever.  Respiratory: Negative for cough, chest tightness and shortness of breath.   Cardiovascular: Negative for chest pain, palpitations and leg swelling.  Gastrointestinal: Negative for abdominal pain, diarrhea, nausea and vomiting.  Genitourinary: Positive for frequency. Negative for dysuria, flank pain, pelvic pain, vaginal bleeding, vaginal discharge and vaginal pain.  Musculoskeletal: Positive for arthralgias, back pain, gait problem, joint swelling and myalgias. Negative for neck pain and neck  stiffness.  Skin: Negative for rash.  Neurological: Negative for dizziness, weakness and headaches.  All other systems reviewed and are negative.    Physical Exam Updated Vital Signs BP (!) 164/96 (BP Location: Left Arm)   Pulse 86   Temp 97.9 F (36.6 C) (Oral)   Resp 18   SpO2 99%   Physical Exam  Constitutional: She appears well-developed and well-nourished. No distress.  HENT:  Head: Normocephalic.  Eyes: Conjunctivae are normal.  Neck: Neck supple.  Cardiovascular: Normal rate, regular rhythm and normal heart sounds.   Pulmonary/Chest: Effort normal and breath sounds normal. No respiratory distress. She has no wheezes. She has no rales.  Abdominal: Soft. Bowel sounds are normal. She exhibits no distension. There is no tenderness. There is no rebound.  Musculoskeletal: She exhibits no edema.  Tenderness to palpation over the left hip. Pain with any range of motion of the hip joint. Patient reports tenderness to palpation over left knee and ankle. Bilateral lower leg edema present. Dorsal pedal pulses intact and equal bilaterally. Decreased sensation over left lateral thigh, left lower shin, left lateral foot.   Neurological: She is alert.  5 out of 5 and equal strength of left  foot plantar and dorsiflexion. Unable to move the knee and hip due to pain.  Skin: Skin is warm and dry.  Psychiatric: She has a normal mood and affect. Her behavior is normal.  Nursing note and vitals reviewed.    ED Treatments / Results  Labs (all labs ordered are listed, but only abnormal results are displayed) Labs Reviewed  CBC WITH DIFFERENTIAL/PLATELET - Abnormal; Notable for the following:       Result Value   Monocytes Absolute 1.5 (*)    All other components within normal limits  URINALYSIS, ROUTINE W REFLEX MICROSCOPIC - Abnormal; Notable for the following:    Color, Urine STRAW (*)    Hgb urine dipstick SMALL (*)    Bacteria, UA MANY (*)    Squamous Epithelial / LPF 0-5 (*)     All other components within normal limits  BASIC METABOLIC PANEL  PROTIME-INR    EKG  EKG Interpretation None       Radiology No results found.  Procedures Procedures (including critical care time)  Medications Ordered in ED Medications  ondansetron (ZOFRAN) injection 4 mg (not administered)  fentaNYL (SUBLIMAZE) injection 25 mcg (not administered)     Initial Impression / Assessment and Plan / ED Course  I have reviewed the triage vital signs and the nursing notes.  Pertinent labs & imaging results that were available during my care of the patient were reviewed by me and considered in my medical decision making (see chart for details).     Patient with worsening left hip pain, now with numbness going down her left leg and numbness in bilateral toes. She is also reporting some vague urinary symptoms, states she has urinary frequency and sometimes even incontinence. I'm concerned the patient may have abnormalities in her lumbar spine in addition to left hip that was just recently MRI. Unable to get MRI at this time at this facility. We will get labs, we'll check INR, will get CT abdomen and pelvis for further evaluation.   11:19 PM CT showing pathological fracture through the bony lytic lesion that was seen, and already known from prior MRI. I discussed patient's results with[facility] who advised to admit to medicine and they will see her in the morning. I spoke with medicine, they will admit.  Patient's urinalysis is clear in appearance, negative nitrite, negative leukocytes, however showing many bacteria. No WBCs. I will send culture of this urine. Patient does have urinary frequency and urgency. Will defer to medicine with or not they want to treat this. Her labs are normal. Her INR is subtherapeutic. With history of DVTs and bilateral leg swelling, she may need venous duplex which I we are unable to obtain at this time.  Vitals:   01/15/17 1921 01/15/17  2107 01/15/17 2116  BP: (!) 164/96  (!) 163/82  Pulse: 86  74  Resp: 18  16  Temp: 97.9 F (36.6 C)    TempSrc: Oral    SpO2: 99%  96%  Weight:  93 kg (205 lb)   Height:  5\' 4"  (1.626 m)     Final Clinical Impressions(s) / ED Diagnoses   Final diagnoses:  Pathological fracture in neoplastic disease, hip, unsp, init (HCC)  Bacteriuria    New Prescriptions New Prescriptions   No medications on file     Jeannett Senior, PA-C 01/15/17 2324    Veryl Speak, MD 01/15/17 2330

## 2017-01-15 NOTE — H&P (Signed)
History and Physical    Stepfanie Yott Edgett DJM:[id number] DOB: 02-24-1941 DOA: 01/15/2017  Referring MD/NP/PA: Lianne Bushy, PA-C PCP: Briscoe Deutscher, DO  Patient coming from: Home via EMS  Chief Complaint: Left hip pain  HPI: Alicia Vasquez is a 76 y.o. female with medical history significant of HTN, DVT on chronic anticoagulation, hyperparathyroidism, and breast cancer s/p partial masectomy and radiation; who presents with complaints of worsening left hip pain. Describes it as a sharp and dull pain that will radiate down her left leg. Patient reports symptoms have been ongoing for the last 8 months. She reports being evaluated and symptoms were reportedly attributed to a likely herniated disks. However, symptoms did not improve with conservative treatments. She was seen by [facility] orthopedics 12 days ago and had a MRI of the left hip. Review of report showed a large lesion of the left femoral neck, intertrochanteric region, greater/ lesser trochanters, a high-grade partial tear of the left hamstring tendon, and abnormally thickened endometrium. There was reportedly no fracture at that time and she was in the process of being scheduled with [name] or a hip replacement. However, in the last 2 days patient reports hip pain significantly worsened to the point which she was unable to ambulate. Denies any falls or significant trauma. Patient had recently been diagnosed with urinary tract infection 1 week, and had  just completed a course of ciprofloxacin yesterday. Patient reports that she stopped taking warfarin for a couple days while on the antibiotics. Her INRs were noted to be subtherapeutic. Associated symptoms include continued dysuria, urinary frequency, mild chest discomfort, mild shortness of breath, dry cough with mild wheeze, weight loss of approximately 30 pounds in last 6-8 months, and new right leg swelling.  ED Course: Upon admission into the emergency department patient was seen to be  afebrile with blood pressures elevated to 164/96, and all the vital signs relatively within normal limits. Labs revealed calcium 10.9 and INR 1.62. Urinalysis negative for signs of infection. CT scan of the left hip shows pathologic fracture along with physical findings to previous MRI.  Review of Systems: Review of Systems  Constitutional: Positive for weight loss. Negative for chills and diaphoresis.  HENT: Positive for hearing loss. Negative for ear discharge and nosebleeds.   Eyes: Negative for pain and discharge.  Respiratory: Positive for cough, shortness of breath and wheezing. Negative for hemoptysis and sputum production.   Cardiovascular: Positive for chest pain and leg swelling.  Gastrointestinal: Negative for abdominal pain and vomiting.  Genitourinary: Positive for dysuria and frequency.  Musculoskeletal: Positive for joint pain. Negative for falls.  Skin: Negative for itching and rash.  Neurological: Positive for focal weakness. Negative for sensory change.  Endo/Heme/Allergies: Positive for environmental allergies. Bruises/bleeds easily.  Psychiatric/Behavioral: Negative for hallucinations and substance abuse.    Past Medical History:  Diagnosis Date  . Allergy   . Anxiety   . Arthritis   . Blood transfusion without reported diagnosis   . Breast cancer ([place]) 07/12/2013   Invasive Mammary Carcinoma  . DVT (deep vein thrombosis) in pregnancy ([place])   . Hypertension   . Hypothyroid   . Personal history of radiation therapy   . Pneumonia   . Radiation 11/21/13-01/07/14   Right Breast/Supraclavicular    Past Surgical History:  Procedure Laterality Date  . BREAST LUMPECTOMY WITH RADIOACTIVE SEED LOCALIZATION Right 09/09/2013   Procedure: BREAST LUMPECTOMY WITH RADIOACTIVE SEED LOCALIZATION WITH AXILLARY NODE EXCISION;  Surgeon: [name], MD;  Location: [facility];  Service: General;  Laterality: Right;  . DENTAL SURGERY  04/19/2012   13 TEETH  REMOVED  . DILATION AND CURETTAGE OF UTERUS    . RE-EXCISION OF BREAST LUMPECTOMY Right 09/24/2013   Procedure: RE-EXCISION OF RIGHT BREAST LUMPECTOMY;  Surgeon: [name], MD;  Location: [place];  Service: General;  Laterality: Right;     reports that she has never smoked. She has never used smokeless tobacco. She reports that she does not drink alcohol or use drugs.  Allergies  Allergen Reactions  . Anesthetics, Amide Hypertension  . Benadryl [Diphenhydramine Hcl] Other (See Comments)    Dizziness  . Carbocaine [Mepivacaine Hcl] Hypertension  . Codeine Other (See Comments)    Dizziness  . Epinephrine Hypertension  . Sulfa Antibiotics Other (See Comments)    dizziness  . Tramadol     Sedation.   Marland Kitchen Penicillins Rash    Family History  Problem Relation Age of Onset  . Heart disease Brother   . Colon cancer Brother   . Prostate cancer Brother     Prior to Admission medications   Medication Sig Start Date End Date Taking? Authorizing Provider  acetaminophen (TYLENOL) 500 MG tablet Take 500 mg by mouth every 6 (six) hours as needed for mild pain, fever or headache. Reported on 09/08/2015   Yes [provider]  Cholecalciferol (VITAMIN D3) 5000 UNITS TABS Take 5,000 Units by mouth daily.    Yes [provider]  HYDROcodone-acetaminophen (NORCO) 5-325 MG tablet Take 1 tablet by mouth every 6 (six) hours as needed for moderate pain. Patient taking differently: Take 0.5 tablets by mouth every 6 (six) hours as needed for moderate pain.  11/15/16  Yes [name], MD  levothyroxine (SYNTHROID) 125 MCG tablet Take 1 tablet (125 mcg total) by mouth daily before breakfast. Dispense as written: Synthroid LAST REFILL 04/15/16  Yes [name], MD  lisinopril (PRINIVIL,ZESTRIL) 10 MG tablet TAKE 1 TABLET (10 MG TOTAL) BY MOUTH DAILY. 09/26/16  Yes Magrinat, [name], MD  warfarin (JANTOVEN) 5 MG tablet TAKE AS DIRECTED BY ANTICOAGULATION  CLINIC Patient taking differently: Take 7.5-10 mg by mouth See admin instructions. TAKE AS DIRECTED BY ANTICOAGULATION CLINIC. 7.5 MG MWF&SUN 10 MG TUE,THURS,SAT 11/11/16  Yes Magrinat, [name], MD  Diclofenac Sodium (PENNSAID) 2 % SOLN Place 1 application onto the skin daily. Patient not taking: Reported on 01/15/2017 12/05/16   Briscoe Deutscher, DO  gabapentin (NEURONTIN) 100 MG capsule Take 1 capsule (100 mg total) by mouth at bedtime. Patient not taking: Reported on 01/15/2017 12/19/16   Briscoe Deutscher, DO    Physical Exam:  Constitutional: Elderly female in NAD, calm, comfortable Vitals:   01/15/17 1921 01/15/17 2107 01/15/17 2116  BP: (!) 164/96  (!) 163/82  Pulse: 86  74  Resp: 18  16  Temp: 97.9 F (36.6 C)    TempSrc: Oral    SpO2: 99%  96%  Weight:  93 kg (205 lb)   Height:  '5\' 4"'  (1.626 m)    Eyes: PERRL, lids and conjunctivae normal ENMT: Mucous membranes are dry. Patient hard of hearing. Posterior pharynx clear of any exudate or lesions.  Neck: normal, supple, no masses, no thyromegaly Respiratory: Mildly decreased aeration with bibasilar crackles appreciated. No significant wheezes appreciated. Cardiovascular: Regular rate and rhythm, no murmurs / rubs / gallops. 1+ pitting edema of the right lower extremity and left. 2+ pedal pulses. No carotid bruits.  Abdomen: no tenderness, no masses palpated. No hepatosplenomegaly. Bowel  sounds positive.  Musculoskeletal: no clubbing / cyanosis. Left hip externally rotated and mildly shortened. Pain with any movement of the left leg. Skin: no rashes, lesions, ulcers. No induration Neurologic: CN 2-12 grossly intact. Sensation intact, DTR normal. Strength 5/5 in all 4.  Psychiatric: Normal judgment and insight. Alert and oriented x 3. Normal mood.     Labs on Admission: I have personally reviewed following labs and imaging studies  CBC:  Recent Labs Lab 01/12/17 1423 01/15/17 2000  WBC 6.1 8.1  NEUTROABS 3.7 5.0  HGB 13.6 13.4   HCT 41.1 41.0  MCV 94.9 93.4  PLT 218 809   Basic Metabolic Panel:  Recent Labs Lab 01/15/17 2000  NA 140  K 5.1  CL 108  CO2 26  GLUCOSE 103*  BUN 19  CREATININE 0.75  CALCIUM 10.9*   GFR: Estimated Creatinine Clearance: 65.1 mL/min (by C-G formula based on SCr of 0.75 mg/dL). Liver Function Tests: No results for input(s): AST, ALT, ALKPHOS, BILITOT, PROT, ALBUMIN in the last 168 hours. No results for input(s): LIPASE, AMYLASE in the last 168 hours. No results for input(s): AMMONIA in the last 168 hours. Coagulation Profile:  Recent Labs Lab 01/12/17 1423 01/15/17 2000  INR 1.20* 1.62  PROTIME 14.4*  --    Cardiac Enzymes: No results for input(s): CKTOTAL, CKMB, CKMBINDEX, TROPONINI in the last 168 hours. BNP (last 3 results) No results for input(s): PROBNP in the last 8760 hours. HbA1C: No results for input(s): HGBA1C in the last 72 hours. CBG: No results for input(s): GLUCAP in the last 168 hours. Lipid Profile: No results for input(s): CHOL, HDL, LDLCALC, TRIG, CHOLHDL, LDLDIRECT in the last 72 hours. Thyroid Function Tests: No results for input(s): TSH, T4TOTAL, FREET4, T3FREE, THYROIDAB in the last 72 hours. Anemia Panel: No results for input(s): VITAMINB12, FOLATE, FERRITIN, TIBC, IRON, RETICCTPCT in the last 72 hours. Urine analysis:    Component Value Date/Time   COLORURINE STRAW (A) 01/15/2017 2000   APPEARANCEUR CLEAR 01/15/2017 2000   LABSPEC 1.008 01/15/2017 2000   LABSPEC 1.010 09/22/2016 1553   PHURINE 6.0 01/15/2017 2000   GLUCOSEU NEGATIVE 01/15/2017 2000   GLUCOSEU Negative 09/22/2016 1553   HGBUR SMALL (A) 01/15/2017 2000   BILIRUBINUR NEGATIVE 01/15/2017 2000   BILIRUBINUR Negative 01/06/2017 1608   BILIRUBINUR Negative 09/22/2016 1553   KETONESUR NEGATIVE 01/15/2017 2000   PROTEINUR NEGATIVE 01/15/2017 2000   UROBILINOGEN 0.2 01/06/2017 1608   UROBILINOGEN 0.2 11/15/2016 1358   UROBILINOGEN 0.2 09/22/2016 1553   NITRITE NEGATIVE  01/15/2017 2000   LEUKOCYTESUR NEGATIVE 01/15/2017 2000   LEUKOCYTESUR Negative 09/22/2016 1553   Sepsis Labs: Recent Results (from the past 240 hour(s))  Urine Culture     Status: None   Collection Time: 01/06/17  4:22 PM  Result Value Ref Range Status   Organism ID, Bacteria   Final    Three or more organisms present,each greater than 10,000 CFU/mL.These organisms,commonly found on external and internal genitalia,are considered to be colonizers.No further testing performed.      Radiological Exams on Admission: Ct Abdomen Pelvis W Contrast  Result Date: 01/15/2017 CLINICAL DATA:  Stated history of pelvic mass.  Left hip pain. EXAM: CT ABDOMEN AND PELVIS WITH CONTRAST TECHNIQUE: Multidetector CT imaging of the abdomen and pelvis was performed using the standard protocol following bolus administration of intravenous contrast. CONTRAST:  100 cc Isovue-300 IV COMPARISON:  None. FINDINGS: Lower chest: Calcified granuloma in the right lower lobe. No consolidation or pleural fluid. Mild distal  esophageal wall thickening. Hepatobiliary: No focal liver abnormality is seen. No gallstones, gallbladder wall thickening, or biliary dilatation. Pancreas: No ductal dilatation or inflammation. Spleen: Scattered granuloma.  Normal in size. Adrenals/Urinary Tract: Minimal thickening of the left adrenal gland without discrete nodule. Right adrenal gland is normal. No hydronephrosis or perinephric edema. No focal renal mass. Homogeneous enhancement with symmetric excretion on delayed phase imaging. Urinary bladder is minimally distended without wall thickening. Stomach/Bowel: Thickening of the distal esophagus. Stomach is within normal limits. Appendix appears normal. No evidence of bowel wall thickening, distention, or inflammatory changes. Colonic diverticulosis without acute diverticulitis. Vascular/Lymphatic: Aortic atherosclerosis without aneurysm. Subcentimeter porta hepatis node. No retroperitoneal or  mesenteric adenopathy. No pelvic adenopathy. Reproductive: Question of endometrial thickening, 14-16 mm. 14 mm left ovarian cyst. Right ovary is tentatively identified and quiescent. Other: No ascites. No free air. Small fat containing umbilical hernia. No subcutaneous soft tissue mass. Musculoskeletal: Lytic lesion in the left femoral neck measures at least 6.1 cm with marked thinning of the femoral cortex. Pathologic fracture through the femoral neck. Minimal T12 superior endplate compression fracture, age indeterminate. No evidence of underlying focal lesion on CT. Multilevel degenerative disc disease and facet arthropathy of the lumbar spine. No evidence of additional focal bone lesion. IMPRESSION: 1. Lytic lesion in the left proximal femur with pathologic femoral neck fracture. This may be secondary to metastatic disease versus multiple myeloma/plasmacytoma in a patient of this age. 2. Probable endometrial thickening, abnormal for a postmenopausal patient. This may be endometrial neoplasm. Consider pelvic ultrasound for more detailed endometrial evaluation. Left ovarian cyst measuring 14 mm. Recommend yearly sonographic follow-up. 3. Thickening of the distal esophagus may be secondary to reflux or esophagitis. 4. Colonic diverticulosis without diverticulitis. Aortic Atherosclerosis (ICD10-I70.0). 5. Sequela of prior granulomatous disease with calcified right lung base nodule and splenic calcifications. Electronically Signed   By: Jeb Levering M.D.   On: 01/15/2017 22:38    EKG: Independently reviewed. Sinus rhythm with a lot of background artifact   Assessment/Plan Pathological hip fracture: Acute. Patient reporting inability to ambulate due to pain and left left hip. Recent MRI showing signs of a large 4.7 x 4.4 x 6.3cm lesion of the left femoral neck, intertrochanteric region, and greater/ lesser trochanters. Patient had MRI report at bedside. - Admit to a MedSurg bed - Check ESR - Hip fracture  order set initiated  - Fentanyl prn pain and Robaxin prn muscle spasms - [facility] orthopedics consulted Dr. Stann Mainland PA was spoken with and they will see patient in a.m., and stated no need to keep patient NPO.  UTI: Acute. Patient was diagnosed with a urinary tract infection earlier this week and had just completed her course of ciprofloxacin. Repeat UA, however still shows many bacteria and patient still having symptoms.. - Follow-up urine culture - Rocephin  Dyspnea: Oxygen saturation maintained on room air. - Monitor O2 saturations and given nasal cannula oxygen needed. - Check chest x-ray  Hyperparathyroidism: Followed by Dr. Layla Maw. For this and last seen on 12/06/2016. At that time patient had PTH of 26 and a calcium 10.6. Patient being treated with high-dose vitamin D. - Held vitamin D  - May want to Follow-up with Dr. Renne Crigler in a.m  Hypercalcemia:Chronic. Mild elevation in calcium to 10.9 on admission. Suspect this could be secondary history of hyperparathyroidism vs. metastic disease. - Recheck calcium in a.m.  Chest pain: Patient reports complaints of some wall chest discomfort. initial EKG shows no significant signs of ischemia. - Check troponin -  Consider need of further workup  Endometrium hyperplasia: Acute. Seen on MRI report. Question possible source of cancer cancer. - Check transvaginal ultrasound  History of breast cancer: Patient currently being followed by Dr. Jana Hakim of oncology. - May want to consult oncology in a.m. get assistance with possible workup.  Hypothyroidism: Patient's last TSH was 1.31 on 12/05/16. - Continue levothyroxine  Essential hypertension - Continue lisinopril   DVT history on chronic anticoagulation, subtherapeutic INR: Acute on chronic. Patient with history of DVT on Coumadin but reports not taking Coumadin for a few days while on antibiotics. INR 1.67 on admission.  - Hold warfarin  - Therapeutic dose Lovenox   Weight loss:  Patient reports a 32 pound weight loss in the last 6-8 months.    DVT prophylaxis: lovenox Code Status: Full  Family Communication: Discuss plan of care with patient and family present at bedside while present at bedside Disposition Plan: TBD most likely will need rehabilitation Consults called: Ortho Admission status: Inpatient   Norval Morton MD [facility] Pager [phone]   If 7PM-7AM, please contact night-coverage [email or web address] Password [email or web address]  01/15/2017, 11:10 PM

## 2017-01-16 ENCOUNTER — Inpatient Hospital Stay (HOSPITAL_COMMUNITY): Payer: Medicare Other | Admitting: Anesthesiology

## 2017-01-16 ENCOUNTER — Inpatient Hospital Stay (HOSPITAL_COMMUNITY): Payer: Medicare Other

## 2017-01-16 ENCOUNTER — Telehealth: Payer: Self-pay

## 2017-01-16 ENCOUNTER — Encounter (HOSPITAL_COMMUNITY): Payer: Self-pay | Admitting: Registered Nurse

## 2017-01-16 ENCOUNTER — Other Ambulatory Visit (HOSPITAL_COMMUNITY): Payer: Medicare Other

## 2017-01-16 ENCOUNTER — Encounter (HOSPITAL_COMMUNITY)
Admission: EM | Disposition: A | Discharge status: Skilled Nursing Facility | Payer: Self-pay | Source: Home / Self Care | Attending: Family Medicine

## 2017-01-16 DIAGNOSIS — M84552A Pathological fracture in neoplastic disease, left femur, initial encounter for fracture: Principal | ICD-10-CM

## 2017-01-16 DIAGNOSIS — M7989 Other specified soft tissue disorders: Secondary | ICD-10-CM

## 2017-01-16 DIAGNOSIS — N390 Urinary tract infection, site not specified: Secondary | ICD-10-CM | POA: Diagnosis present

## 2017-01-16 DIAGNOSIS — N8500 Endometrial hyperplasia, unspecified: Secondary | ICD-10-CM | POA: Diagnosis present

## 2017-01-16 DIAGNOSIS — Z01818 Encounter for other preprocedural examination: Secondary | ICD-10-CM

## 2017-01-16 DIAGNOSIS — R0602 Shortness of breath: Secondary | ICD-10-CM

## 2017-01-16 HISTORY — PX: TOTAL HIP ARTHROPLASTY: SHX124

## 2017-01-16 LAB — CBC
HCT: 38.8 % (ref 36.0–46.0)
HEMOGLOBIN: 12.9 g/dL (ref 12.0–15.0)
MCH: 31.1 pg (ref 26.0–34.0)
MCHC: 33.2 g/dL (ref 30.0–36.0)
MCV: 93.5 fL (ref 78.0–100.0)
Platelets: 196 10*3/uL (ref 150–400)
RBC: 4.15 MIL/uL (ref 3.87–5.11)
RDW: 13.9 % (ref 11.5–15.5)
WBC: 6.9 10*3/uL (ref 4.0–10.5)

## 2017-01-16 LAB — BASIC METABOLIC PANEL
ANION GAP: 6 (ref 5–15)
BUN: 16 mg/dL (ref 6–20)
CHLORIDE: 109 mmol/L (ref 101–111)
CO2: 27 mmol/L (ref 22–32)
Calcium: 10.3 mg/dL (ref 8.9–10.3)
Creatinine, Ser: 0.78 mg/dL (ref 0.44–1.00)
GFR calc Af Amer: >60 mL/min (ref >60)
GFR calc non Af Amer: >60 mL/min (ref >60)
Glucose, Bld: 107 mg/dL — ABNORMAL HIGH (ref 65–99)
POTASSIUM: 3.9 mmol/L (ref 3.5–5.1)
SODIUM: 142 mmol/L (ref 135–145)

## 2017-01-16 LAB — SEDIMENTATION RATE: Sed Rate: 10 mm/hr (ref 0–22)

## 2017-01-16 LAB — PROTIME-INR
INR: 1.53
PROTHROMBIN TIME: 18.6 s — AB (ref 11.4–15.2)

## 2017-01-16 LAB — ABO/RH: ABO/RH(D): O POS

## 2017-01-16 LAB — TROPONIN I: Troponin I: <0.03 ng/mL (ref <0.03)

## 2017-01-16 LAB — SURGICAL PCR SCREEN
MRSA, PCR: NEGATIVE
Staphylococcus aureus: NEGATIVE

## 2017-01-16 SURGERY — ARTHROPLASTY, HIP, TOTAL,POSTERIOR APPROACH
Anesthesia: General | Laterality: Left

## 2017-01-16 MED ORDER — MAGNESIUM CITRATE PO SOLN: 1.0000 | Freq: Once | ORAL | Status: DC | PRN

## 2017-01-16 MED ORDER — CEFAZOLIN SODIUM-DEXTROSE 2-4 GM/100ML-% IV SOLN
2.0000 g | INTRAVENOUS | Status: AC
  Administered 2017-01-16: 2 g via INTRAVENOUS

## 2017-01-16 MED ORDER — LIDOCAINE 2% (20 MG/ML) 5 ML SYRINGE
INTRAMUSCULAR | Status: DC | PRN
  Administered 2017-01-16: 100 mg via INTRAVENOUS

## 2017-01-16 MED ORDER — ACETAMINOPHEN 10 MG/ML IV SOLN
INTRAVENOUS | Status: AC
  Filled 2017-01-16: qty 100

## 2017-01-16 MED ORDER — FENTANYL CITRATE (PF) 100 MCG/2ML IJ SOLN
INTRAMUSCULAR | Status: AC
  Filled 2017-01-16: qty 2

## 2017-01-16 MED ORDER — EPHEDRINE SULFATE-NACL 50-0.9 MG/10ML-% IV SOSY
PREFILLED_SYRINGE | INTRAVENOUS | Status: DC | PRN
  Administered 2017-01-16: 5 mg via INTRAVENOUS
  Administered 2017-01-16: 10 mg via INTRAVENOUS
  Administered 2017-01-16: 5 mg via INTRAVENOUS

## 2017-01-16 MED ORDER — WARFARIN - PHYSICIAN DOSING INPATIENT: Freq: Every day | Status: DC

## 2017-01-16 MED ORDER — POVIDONE-IODINE 10 % EX SWAB
2.0000 "application " | Freq: Once | CUTANEOUS | Status: AC
  Administered 2017-01-16: 2 via TOPICAL

## 2017-01-16 MED ORDER — HYDROCODONE-ACETAMINOPHEN 5-325 MG PO TABS: 1.0000 | ORAL_TABLET | Freq: Four times a day (QID) | ORAL | 0 refills | Status: DC | PRN

## 2017-01-16 MED ORDER — ONDANSETRON HCL 4 MG/2ML IJ SOLN
INTRAMUSCULAR | Status: DC | PRN
Start: 2017-01-16 — End: 2017-01-16
  Administered 2017-01-16: 4 mg via INTRAVENOUS

## 2017-01-16 MED ORDER — PHENOL 1.4 % MT LIQD: 1.0000 | OROMUCOSAL | Status: DC | PRN

## 2017-01-16 MED ORDER — CEFAZOLIN SODIUM-DEXTROSE 2-4 GM/100ML-% IV SOLN
2.0000 g | Freq: Four times a day (QID) | INTRAVENOUS | Status: AC
  Administered 2017-01-16 – 2017-01-17 (×2): 2 g via INTRAVENOUS
  Filled 2017-01-16 (×2): qty 100

## 2017-01-16 MED ORDER — DOCUSATE SODIUM 100 MG PO CAPS
100.0000 mg | ORAL_CAPSULE | Freq: Two times a day (BID) | ORAL | Status: DC
  Administered 2017-01-17 – 2017-01-21 (×9): 100 mg via ORAL
  Filled 2017-01-16 (×9): qty 1

## 2017-01-16 MED ORDER — HYDROMORPHONE HCL-NACL 0.5-0.9 MG/ML-% IV SOSY: 0.5000 mg | PREFILLED_SYRINGE | INTRAVENOUS | Status: DC | PRN

## 2017-01-16 MED ORDER — POLYETHYLENE GLYCOL 3350 17 G PO PACK
17.0000 g | PACK | Freq: Every day | ORAL | Status: DC | PRN
  Administered 2017-01-19 – 2017-01-21 (×2): 17 g via ORAL
  Filled 2017-01-16 (×2): qty 1

## 2017-01-16 MED ORDER — METHOCARBAMOL 500 MG PO TABS: 500.0000 mg | ORAL_TABLET | Freq: Four times a day (QID) | ORAL | 0 refills | Status: DC | PRN

## 2017-01-16 MED ORDER — CEFAZOLIN SODIUM-DEXTROSE 2-4 GM/100ML-% IV SOLN
INTRAVENOUS | Status: AC
  Filled 2017-01-16: qty 100

## 2017-01-16 MED ORDER — FERROUS SULFATE 325 (65 FE) MG PO TABS
325.0000 mg | ORAL_TABLET | Freq: Three times a day (TID) | ORAL | Status: DC
  Administered 2017-01-17 – 2017-01-21 (×14): 325 mg via ORAL
  Filled 2017-01-16 (×13): qty 1

## 2017-01-16 MED ORDER — SUGAMMADEX SODIUM 200 MG/2ML IV SOLN
INTRAVENOUS | Status: DC | PRN
  Administered 2017-01-16: 200 mg via INTRAVENOUS

## 2017-01-16 MED ORDER — WARFARIN SODIUM 7.5 MG PO TABS: 7.5000 mg | ORAL_TABLET | Freq: Every day | ORAL | Status: DC

## 2017-01-16 MED ORDER — SUCCINYLCHOLINE CHLORIDE 20 MG/ML IJ SOLN
INTRAMUSCULAR | Status: DC | PRN
  Administered 2017-01-16: 100 mg via INTRAVENOUS

## 2017-01-16 MED ORDER — MENTHOL 3 MG MT LOZG
1.0000 | LOZENGE | OROMUCOSAL | Status: DC | PRN
  Filled 2017-01-16: qty 9

## 2017-01-16 MED ORDER — DEXAMETHASONE SODIUM PHOSPHATE 10 MG/ML IJ SOLN
8.0000 mg | Freq: Once | INTRAMUSCULAR | Status: AC
  Administered 2017-01-16: 10 mg via INTRAVENOUS
  Filled 2017-01-16: qty 0.8

## 2017-01-16 MED ORDER — FENTANYL CITRATE (PF) 100 MCG/2ML IJ SOLN
25.0000 ug | INTRAMUSCULAR | Status: DC | PRN
  Administered 2017-01-16 (×2): 25 ug via INTRAVENOUS

## 2017-01-16 MED ORDER — ALBUMIN HUMAN 5 % IV SOLN
INTRAVENOUS | Status: DC | PRN
  Administered 2017-01-16: 18:00:00 via INTRAVENOUS

## 2017-01-16 MED ORDER — PHENYLEPHRINE 40 MCG/ML (10ML) SYRINGE FOR IV PUSH (FOR BLOOD PRESSURE SUPPORT)
PREFILLED_SYRINGE | INTRAVENOUS | Status: AC
  Filled 2017-01-16: qty 30

## 2017-01-16 MED ORDER — DEXTROSE 5 % IV SOLN
1.0000 g | Freq: Every day | INTRAVENOUS | Status: DC
  Administered 2017-01-16 – 2017-01-19 (×5): 1 g via INTRAVENOUS
  Filled 2017-01-16 (×6): qty 10

## 2017-01-16 MED ORDER — LACTATED RINGERS IV SOLN
INTRAVENOUS | Status: DC
  Administered 2017-01-16 (×3): via INTRAVENOUS

## 2017-01-16 MED ORDER — ONDANSETRON HCL 4 MG PO TABS: 4.0000 mg | ORAL_TABLET | Freq: Four times a day (QID) | ORAL | Status: DC | PRN

## 2017-01-16 MED ORDER — FENTANYL CITRATE (PF) 250 MCG/5ML IJ SOLN
INTRAMUSCULAR | Status: AC
  Filled 2017-01-16: qty 5

## 2017-01-16 MED ORDER — HYDROCODONE-ACETAMINOPHEN 5-325 MG PO TABS
1.0000 | ORAL_TABLET | Freq: Four times a day (QID) | ORAL | Status: DC | PRN
  Administered 2017-01-17: 1 via ORAL
  Administered 2017-01-17 – 2017-01-18 (×2): 2 via ORAL
  Filled 2017-01-16: qty 1
  Filled 2017-01-16 (×2): qty 2

## 2017-01-16 MED ORDER — CHLORHEXIDINE GLUCONATE 4 % EX LIQD
60.0000 mL | Freq: Once | CUTANEOUS | Status: AC
  Administered 2017-01-16: 4 via TOPICAL
  Filled 2017-01-16 (×2): qty 60

## 2017-01-16 MED ORDER — ENOXAPARIN SODIUM 100 MG/ML ~~LOC~~ SOLN
90.0000 mg | Freq: Two times a day (BID) | SUBCUTANEOUS | Status: DC
  Administered 2017-01-16: 90 mg via SUBCUTANEOUS
  Filled 2017-01-16 (×3): qty 0.9
  Filled 2017-01-16: qty 1

## 2017-01-16 MED ORDER — PHENYLEPHRINE 40 MCG/ML (10ML) SYRINGE FOR IV PUSH (FOR BLOOD PRESSURE SUPPORT)
PREFILLED_SYRINGE | INTRAVENOUS | Status: DC | PRN
  Administered 2017-01-16: 200 ug via INTRAVENOUS
  Administered 2017-01-16: 120 ug via INTRAVENOUS
  Administered 2017-01-16: 160 ug via INTRAVENOUS
  Administered 2017-01-16: 40 ug via INTRAVENOUS
  Administered 2017-01-16: 160 ug via INTRAVENOUS
  Administered 2017-01-16: 120 ug via INTRAVENOUS
  Administered 2017-01-16: 160 ug via INTRAVENOUS
  Administered 2017-01-16: 80 ug via INTRAVENOUS
  Administered 2017-01-16: 120 ug via INTRAVENOUS
  Administered 2017-01-16 (×2): 80 ug via INTRAVENOUS

## 2017-01-16 MED ORDER — PROPOFOL 10 MG/ML IV BOLUS
INTRAVENOUS | Status: AC
  Filled 2017-01-16: qty 20

## 2017-01-16 MED ORDER — PROPOFOL 10 MG/ML IV BOLUS
INTRAVENOUS | Status: DC | PRN
  Administered 2017-01-16: 180 mg via INTRAVENOUS

## 2017-01-16 MED ORDER — METOCLOPRAMIDE HCL 5 MG PO TABS: 5.0000 mg | ORAL_TABLET | Freq: Three times a day (TID) | ORAL | Status: DC | PRN

## 2017-01-16 MED ORDER — ONDANSETRON HCL 4 MG/2ML IJ SOLN: 4.0000 mg | Freq: Four times a day (QID) | INTRAMUSCULAR | Status: DC | PRN

## 2017-01-16 MED ORDER — MEPERIDINE HCL 50 MG/ML IJ SOLN: 6.2500 mg | INTRAMUSCULAR | Status: DC | PRN

## 2017-01-16 MED ORDER — NITROGLYCERIN 0.4 MG SL SUBL
0.4000 mg | SUBLINGUAL_TABLET | SUBLINGUAL | Status: DC | PRN
  Filled 2017-01-16: qty 1

## 2017-01-16 MED ORDER — BISACODYL 10 MG RE SUPP: 10.0000 mg | Freq: Every day | RECTAL | Status: DC | PRN

## 2017-01-16 MED ORDER — METOCLOPRAMIDE HCL 5 MG/ML IJ SOLN: 5.0000 mg | Freq: Three times a day (TID) | INTRAMUSCULAR | Status: DC | PRN

## 2017-01-16 MED ORDER — GLYCOPYRROLATE 0.2 MG/ML IV SOSY
PREFILLED_SYRINGE | INTRAVENOUS | Status: DC | PRN
  Administered 2017-01-16: .2 mg via INTRAVENOUS

## 2017-01-16 MED ORDER — ROCURONIUM BROMIDE 10 MG/ML (PF) SYRINGE
PREFILLED_SYRINGE | INTRAVENOUS | Status: DC | PRN
  Administered 2017-01-16: 50 mg via INTRAVENOUS

## 2017-01-16 MED ORDER — ACETAMINOPHEN 10 MG/ML IV SOLN
1000.0000 mg | Freq: Once | INTRAVENOUS | Status: AC
  Administered 2017-01-16: 1000 mg via INTRAVENOUS

## 2017-01-16 SURGICAL SUPPLY — 51 items
ADH SKN CLS APL DERMABOND .7 (GAUZE/BANDAGES/DRESSINGS) ×1
BAG DECANTER FOR FLEXI CONT (MISCELLANEOUS) ×2 IMPLANT
BAG SPEC THK2 15X12 ZIP CLS (MISCELLANEOUS) ×1
BAG ZIPLOCK 12X15 (MISCELLANEOUS) ×2 IMPLANT
BLADE SAW SGTL 11.0X1.19X90.0M (BLADE) IMPLANT
BLADE SAW SGTL 18X1.27X75 (BLADE) ×2 IMPLANT
CAPT HIP TOTAL 2 ×1 IMPLANT
COVER SURGICAL LIGHT HANDLE (MISCELLANEOUS) ×2 IMPLANT
DERMABOND ADVANCED (GAUZE/BANDAGES/DRESSINGS) ×1
DERMABOND ADVANCED .7 DNX12 (GAUZE/BANDAGES/DRESSINGS) ×1 IMPLANT
DRAPE INCISE IOBAN 85X60 (DRAPES) ×1 IMPLANT
DRAPE ORTHO SPLIT 77X108 STRL (DRAPES) ×4
DRAPE POUCH INSTRU U-SHP 10X18 (DRAPES) ×2 IMPLANT
DRAPE SURG 17X11 SM STRL (DRAPES) ×2 IMPLANT
DRAPE SURG ORHT 6 SPLT 77X108 (DRAPES) ×2 IMPLANT
DRAPE U-SHAPE 47X51 STRL (DRAPES) ×2 IMPLANT
DRESSING AQUACEL AG SP 3.5X10 (GAUZE/BANDAGES/DRESSINGS) ×1 IMPLANT
DRSG AQUACEL AG SP 3.5X10 (GAUZE/BANDAGES/DRESSINGS) ×2
DURAPREP 26ML APPLICATOR (WOUND CARE) ×2 IMPLANT
ELECT BLADE TIP CTD 4 INCH (ELECTRODE) ×2 IMPLANT
ELECT REM PT RETURN 15FT ADLT (MISCELLANEOUS) ×2 IMPLANT
FACESHIELD WRAPAROUND (MASK) ×8 IMPLANT
FACESHIELD WRAPAROUND OR TEAM (MASK) ×4 IMPLANT
GLOVE BIOGEL M 7.0 STRL (GLOVE) IMPLANT
GLOVE BIOGEL PI IND STRL 7.5 (GLOVE) ×1 IMPLANT
GLOVE BIOGEL PI IND STRL 8.5 (GLOVE) ×1 IMPLANT
GLOVE BIOGEL PI INDICATOR 7.5 (GLOVE) ×1
GLOVE BIOGEL PI INDICATOR 8.5 (GLOVE) ×1
GLOVE ECLIPSE 8.0 STRL XLNG CF (GLOVE) ×2 IMPLANT
GLOVE ORTHO TXT STRL SZ7.5 (GLOVE) ×4 IMPLANT
GOWN STRL REUS W/TWL LRG LVL3 (GOWN DISPOSABLE) ×2 IMPLANT
GOWN STRL REUS W/TWL XL LVL3 (GOWN DISPOSABLE) ×4 IMPLANT
MANIFOLD NEPTUNE II (INSTRUMENTS) ×2 IMPLANT
MARKER SKIN DUAL TIP RULER LAB (MISCELLANEOUS) ×2 IMPLANT
NDL SAFETY ECLIPSE 18X1.5 (NEEDLE) ×1 IMPLANT
NEEDLE HYPO 18GX1.5 SHARP (NEEDLE) ×2
NS IRRIG 1000ML POUR BTL (IV SOLUTION) ×2 IMPLANT
PADDING CAST COTTON 6X4 STRL (CAST SUPPLIES) ×2 IMPLANT
POSITIONER SURGICAL ARM (MISCELLANEOUS) ×2 IMPLANT
SUCTION FRAZIER HANDLE 10FR (MISCELLANEOUS) ×1
SUCTION TUBE FRAZIER 10FR DISP (MISCELLANEOUS) ×1 IMPLANT
SUT MNCRL AB 4-0 PS2 18 (SUTURE) ×2 IMPLANT
SUT VIC AB 1 CT1 36 (SUTURE) ×6 IMPLANT
SUT VIC AB 2-0 CT1 27 (SUTURE) ×4
SUT VIC AB 2-0 CT1 TAPERPNT 27 (SUTURE) ×2 IMPLANT
SUT VLOC 180 0 24IN GS25 (SUTURE) ×4 IMPLANT
SYR 50ML LL SCALE MARK (SYRINGE) ×2 IMPLANT
TOWEL OR 17X26 10 PK STRL BLUE (TOWEL DISPOSABLE) ×4 IMPLANT
TRAY FOLEY W/METER SILVER 16FR (SET/KITS/TRAYS/PACK) ×2 IMPLANT
WATER STERILE IRR 1500ML POUR (IV SOLUTION) ×2 IMPLANT
YANKAUER SUCT BULB TIP 10FT TU (MISCELLANEOUS) ×2 IMPLANT

## 2017-01-16 NOTE — Anesthesia Procedure Notes (Addendum)
Procedure Name: Intubation Date/Time: 01/16/2017 5:03 PM Performed by: Gean Maidens Pre-anesthesia Checklist: Patient identified, Suction available, Emergency Drugs available and Patient being monitored Patient Re-evaluated:Patient Re-evaluated prior to induction Oxygen Delivery Method: Circle system utilized Preoxygenation: Pre-oxygenation with 100% oxygen Induction Type: IV induction Ventilation: Oral airway inserted - appropriate to patient size and Mask ventilation without difficulty Laryngoscope Size: Glidescope and 3 (x1 attempt miller 2) Grade View: Grade I Tube type: Oral Tube size: 7.0 mm Number of attempts: 2 Airway Equipment and Method: Rigid stylet and Video-laryngoscopy Placement Confirmation: positive ETCO2 and breath sounds checked- equal and bilateral Secured at: 23 cm Tube secured with: Tape Dental Injury: Teeth and Oropharynx as per pre-operative assessment  Difficulty Due To: Difficulty was anticipated, Difficult Airway- due to limited oral opening and Difficult Airway- due to anterior larynx Future Recommendations: Recommend- induction with short-acting agent, and alternative techniques readily available

## 2017-01-16 NOTE — Discharge Instructions (Signed)

## 2017-01-16 NOTE — Consult Note (Signed)
Cardiology Consultation:   Patient ID: HYACINTH MARCELLI; [id number]; May 13, 1941   Admit date: 01/15/2017 Date of Consult: 01/16/2017  Primary Care Provider: Briscoe Deutscher, DO Primary Cardiologist: Dr. Martinique  Patient Profile:   Alicia Vasquez is a 76 y.o. female with a hx of Hypertension, prior DVT on chronic anticoagulation, hyperparathyroidism, and breast cancer /sp mastectomy and XRT who is being seen today for the evaluation of pre-surgical risk assesment at the request of Dr. Tyrell Antonio.    History of Present Illness:   Alicia Vasquez was seen in the ED 8/19 with worsening left hip pain.  She has reportedly been experiencing pain for the last 8 months and had attributed it to herniated discs.  She saw an orthopedic surgeaon nearly two weeks ago and was found to have large lesions in the L femoral neck, intertrochanteric region, greater/lesser trochanters and a partial tear of the L hamstring tendon.  There was no fracture and she was to have a scheduled hip replacement.  For the two days prior to presenting to the ED she has an acute increase in pain. She had a CT of the abdomen/pelvis that showed a lytic lesion in the L proximal femur with a pathologic femoral neck fracture.  It also showed endometrial thickening that was abnormal for her age.  Aortic atheroclerosis was noted.  She is scheduled for surgery this afternoon.  However, she reported chest pain, so cardiology was consulted for pre-surgical risk assessment.  Alicia Vasquez reports that she has been experiencing chest pain with exertion since her surgery in 2015. She had a nuclear stress test 08/13/13 that revealed normal systolic function and no ischemia.  She describes it as dull pressure that radiates under each breast.   It got worse after her pulmonary embolism.  She reports associated shortness of breath but no nausea or diaphoresis. She notes edema in her R LE lately but no orthopnea or PND.  She had an echo 09/2010 that revealed LVEF  66% and was otherwise unremarkable.      Past Medical History:  Diagnosis Date  . Allergy   . Anxiety   . Arthritis   . Blood transfusion without reported diagnosis   . Breast cancer ([place]) 07/12/2013   Invasive Mammary Carcinoma  . DVT (deep vein thrombosis) in pregnancy ([place])   . Hypertension   . Hypothyroid   . Personal history of radiation therapy   . Pneumonia   . Radiation 11/21/13-01/07/14   Right Breast/Supraclavicular    Past Surgical History:  Procedure Laterality Date  . BREAST LUMPECTOMY WITH RADIOACTIVE SEED LOCALIZATION Right 09/09/2013   Procedure: BREAST LUMPECTOMY WITH RADIOACTIVE SEED LOCALIZATION WITH AXILLARY NODE EXCISION;  Surgeon: [name], MD;  Location: [facility];  Service: General;  Laterality: Right;  . DENTAL SURGERY  04/19/2012   13 TEETH REMOVED  . DILATION AND CURETTAGE OF UTERUS    . RE-EXCISION OF BREAST LUMPECTOMY Right 09/24/2013   Procedure: RE-EXCISION OF RIGHT BREAST LUMPECTOMY;  Surgeon: [name], MD;  Location: [facility];  Service: General;  Laterality: Right;     Home Medications:  Prior to Admission medications   Medication Sig Start Date End Date Taking? Authorizing Provider  acetaminophen (TYLENOL) 500 MG tablet Take 500 mg by mouth every 6 (six) hours as needed for mild pain, fever or headache. Reported on 09/08/2015   Yes [provider]  Cholecalciferol (VITAMIN D3) 5000 UNITS TABS Take 5,000 Units by mouth daily.    Yes [provider]  HYDROcodone-acetaminophen (NORCO) 5-325 MG tablet Take 1 tablet by mouth every 6 (six) hours as needed for moderate pain. Patient taking differently: Take 0.5 tablets by mouth every 6 (six) hours as needed for moderate pain.  11/15/16  Yes [name], MD  levothyroxine (SYNTHROID) 125 MCG tablet Take 1 tablet (125 mcg total) by mouth daily before breakfast. Dispense as written: Synthroid LAST REFILL 04/15/16  Yes [name], MD   lisinopril (PRINIVIL,ZESTRIL) 10 MG tablet TAKE 1 TABLET (10 MG TOTAL) BY MOUTH DAILY. 09/26/16  Yes Magrinat, [name], MD  warfarin (JANTOVEN) 5 MG tablet TAKE AS DIRECTED BY ANTICOAGULATION CLINIC Patient taking differently: Take 7.5-10 mg by mouth See admin instructions. TAKE AS DIRECTED BY ANTICOAGULATION CLINIC. 7.5 MG MWF&SUN 10 MG TUE,THURS,SAT 11/11/16  Yes Magrinat, [name], MD  Diclofenac Sodium (PENNSAID) 2 % SOLN Place 1 application onto the skin daily. Patient not taking: Reported on 01/15/2017 12/05/16   Briscoe Deutscher, DO  gabapentin (NEURONTIN) 100 MG capsule Take 1 capsule (100 mg total) by mouth at bedtime. Patient not taking: Reported on 01/15/2017 12/19/16   Briscoe Deutscher, DO    Inpatient Medications: Scheduled Meds: . chlorhexidine  60 mL Topical Once  . dexamethasone  8 mg Intravenous Once  . enoxaparin (LOVENOX) injection  90 mg Subcutaneous BID  . levothyroxine  125 mcg Oral QAC breakfast  . povidone-iodine  2 application Topical Once   Continuous Infusions: .  ceFAZolin (ANCEF) IV    . cefTRIAXone (ROCEPHIN)  IV Stopped (01/16/17 0239)  . methocarbamol (ROBAXIN)  IV     PRN Meds: fentaNYL (SUBLIMAZE) injection, HYDROcodone-acetaminophen, methocarbamol **OR** methocarbamol (ROBAXIN)  IV, nitroGLYCERIN  Allergies:    Allergies  Allergen Reactions  . Anesthetics, Amide Hypertension  . Benadryl [Diphenhydramine Hcl] Other (See Comments)    Dizziness  . Carbocaine [Mepivacaine Hcl] Hypertension  . Codeine Other (See Comments)    Dizziness  . Epinephrine Hypertension  . Sulfa Antibiotics Other (See Comments)    dizziness  . Tramadol     Sedation.   Marland Kitchen Penicillins Rash    Social History:   Social History   Social History  . Marital status: Widowed    Spouse name: N/A  . Number of children: 1  . Years of education: N/A   Occupational History  . Not on file.   Social History Main Topics  . Smoking status: Never Smoker  . Smokeless tobacco: Never  Used  . Alcohol use No  . Drug use: No  . Sexual activity: No   Other Topics Concern  . Not on file   Social History Narrative   Exercise: yard work.    Family History:    Family History  Problem Relation Age of Onset  . Heart disease Brother   . Colon cancer Brother   . Prostate cancer Brother      ROS:  Please see the history of present illness.  Review of Systems  Constitution: Positive for weight loss. Negative for night sweats.  HENT: Negative.   Cardiovascular: Positive for chest pain, dyspnea on exertion and leg swelling. Negative for orthopnea, palpitations, paroxysmal nocturnal dyspnea and syncope.  Respiratory: Negative for cough and sputum production.   Skin: Negative for rash.  Musculoskeletal: Positive for joint pain.  Gastrointestinal: Negative.   Genitourinary: Positive for frequency.  Neurological: Negative.   Psychiatric/Behavioral: Negative.   Allergic/Immunologic: Negative.     All other ROS reviewed and negative.     Physical Exam/Data:   Vitals:  01/16/17 0100 01/16/17 0603 01/16/17 1000 01/16/17 1110  BP: (!) 155/73 (!) 132/49 (!) 141/59 134/60  Pulse: 73 69 69 69  Resp:  17 16   Temp: (!) 97 F (36.1 C) (!) 97.5 F (36.4 C) 98 F (36.7 C)   TempSrc: Oral Oral Oral   SpO2: 99% 95% 97%   Weight:      Height:        Intake/Output Summary (Last 24 hours) at 01/16/17 1219 Last data filed at 01/16/17 1030  Gross per 24 hour  Intake           656.25 ml  Output              800 ml  Net          -143.75 ml   Filed Weights   01/15/17 2107  Weight: 93 kg (205 lb)   VS:  BP (!) 149/58 (BP Location: Left Arm)   Pulse 69   Temp 97.6 F (36.4 C) (Oral)   Resp 17   Ht '5\' 4"'  (1.626 m)   Wt 93 kg (205 lb)   SpO2 98%   BMI 35.19 kg/m  , BMI Body mass index is 35.19 kg/m. GENERAL:  Well appearing.  No acute distress.  HEENT: Pupils equal round and reactive, fundi not visualized, oral mucosa unremarkable NECK:  No jugular venous  distention, waveform within normal limits, carotid upstroke brisk and symmetric, no bruits, no thyromegaly CHEST: No chest wall tenderness to palpation. LUNGS:  Clear to auscultation bilaterally HEART:  RRR.  PMI not displaced or sustained,S1 and S2 within normal limits, no S3, no S4, no clicks, no rubs, no murmurs ABD:  Flat, positive bowel sounds normal in frequency in pitch, no bruits, no rebound, no guarding, no midline pulsatile mass, no hepatomegaly, no splenomegaly EXT:  2 plus pulses throughout, no edema, no cyanosis no clubbing SKIN:  No rashes no nodules NEURO:  Cranial nerves II through XII grossly intact, motor grossly intact throughout PSYCH:  Cognitively intact, oriented to person place and time   EKG:  The EKG was personally reviewed and demonstrates:  Sinus rhythm.  Rate 74 bpm.  PVC. Telemetry:  Telemetry was personally reviewed and demonstrates:  Sinus rhythm. PVCs  Relevant CV Studies:  Echo 10/23/10: LVEF 66%.  Trace MR.  Trace TR.  Laboratory Data:  Chemistry Recent Labs Lab 01/15/17 2000 01/16/17 0132  NA 140 142  K 5.1 3.9  CL 108 109  CO2 26 27  GLUCOSE 103* 107*  BUN 19 16  CREATININE 0.75 0.78  CALCIUM 10.9* 10.3  GFRNONAA >60 >60  GFRAA >60 >60  ANIONGAP 6 6    No results for input(s): PROT, ALBUMIN, AST, ALT, ALKPHOS, BILITOT in the last 168 hours. Hematology Recent Labs Lab 01/12/17 1423 01/15/17 2000 01/16/17 0132  WBC 6.1 8.1 6.9  RBC 4.33 4.39 4.15  HGB 13.6 13.4 12.9  HCT 41.1 41.0 38.8  MCV 94.9 93.4 93.5  MCH 31.4 30.5 31.1  MCHC 33.1 32.7 33.2  RDW 13.9 13.9 13.9  PLT 218 222 196   Cardiac Enzymes Recent Labs Lab 01/16/17 0132 01/16/17 1101  TROPONINI <0.03 <0.03   No results for input(s): TROPIPOC in the last 168 hours.  BNPNo results for input(s): BNP, PROBNP in the last 168 hours.  DDimer No results for input(s): DDIMER in the last 168 hours.  Radiology/Studies:  Ct Abdomen Pelvis W Contrast  Result Date:  01/15/2017 CLINICAL DATA:  Stated history of pelvic  mass.  Left hip pain. EXAM: CT ABDOMEN AND PELVIS WITH CONTRAST TECHNIQUE: Multidetector CT imaging of the abdomen and pelvis was performed using the standard protocol following bolus administration of intravenous contrast. CONTRAST:  100 cc Isovue-300 IV COMPARISON:  None. FINDINGS: Lower chest: Calcified granuloma in the right lower lobe. No consolidation or pleural fluid. Mild distal esophageal wall thickening. Hepatobiliary: No focal liver abnormality is seen. No gallstones, gallbladder wall thickening, or biliary dilatation. Pancreas: No ductal dilatation or inflammation. Spleen: Scattered granuloma.  Normal in size. Adrenals/Urinary Tract: Minimal thickening of the left adrenal gland without discrete nodule. Right adrenal gland is normal. No hydronephrosis or perinephric edema. No focal renal mass. Homogeneous enhancement with symmetric excretion on delayed phase imaging. Urinary bladder is minimally distended without wall thickening. Stomach/Bowel: Thickening of the distal esophagus. Stomach is within normal limits. Appendix appears normal. No evidence of bowel wall thickening, distention, or inflammatory changes. Colonic diverticulosis without acute diverticulitis. Vascular/Lymphatic: Aortic atherosclerosis without aneurysm. Subcentimeter porta hepatis node. No retroperitoneal or mesenteric adenopathy. No pelvic adenopathy. Reproductive: Question of endometrial thickening, 14-16 mm. 14 mm left ovarian cyst. Right ovary is tentatively identified and quiescent. Other: No ascites. No free air. Small fat containing umbilical hernia. No subcutaneous soft tissue mass. Musculoskeletal: Lytic lesion in the left femoral neck measures at least 6.1 cm with marked thinning of the femoral cortex. Pathologic fracture through the femoral neck. Minimal T12 superior endplate compression fracture, age indeterminate. No evidence of underlying focal lesion on CT. Multilevel  degenerative disc disease and facet arthropathy of the lumbar spine. No evidence of additional focal bone lesion. IMPRESSION: 1. Lytic lesion in the left proximal femur with pathologic femoral neck fracture. This may be secondary to metastatic disease versus multiple myeloma/plasmacytoma in a patient of this age. 2. Probable endometrial thickening, abnormal for a postmenopausal patient. This may be endometrial neoplasm. Consider pelvic ultrasound for more detailed endometrial evaluation. Left ovarian cyst measuring 14 mm. Recommend yearly sonographic follow-up. 3. Thickening of the distal esophagus may be secondary to reflux or esophagitis. 4. Colonic diverticulosis without diverticulitis. Aortic Atherosclerosis (ICD10-I70.0). 5. Sequela of prior granulomatous disease with calcified right lung base nodule and splenic calcifications. Electronically Signed   By: Jeb Levering M.D.   On: 01/15/2017 22:38   Chest Portable 1 View  Result Date: 01/16/2017 CLINICAL DATA:  Shortness of breath. History of breast cancer, pneumonia. EXAM: PORTABLE CHEST 1 VIEW COMPARISON:  Chest radiograph June 14, 2016 FINDINGS: Cardiomediastinal silhouette is normal. No pleural effusions or focal consolidations. Mild pulmonary vascular congestion. Trachea projects midline and there is no pneumothorax. Soft tissue planes and included osseous structures are non-suspicious. Surgical clips RIGHT chest wall. Faint calcifications in neck are likely vascular. IMPRESSION: Mild pulmonary vascular congestion. Electronically Signed   By: Elon Alas M.D.   On: 01/16/2017 00:49    Assessment and Plan:   # Pre-surgical risk assessment: Alicia Vasquez reports exertional chest pain.  However, these symptoms have been ongoing since the time of her breast surgery.  She had a Lexiscan Myoview in 2015 that was negative for ischemia.  Symptoms have been unchanged since her stress.  We discussed the possibility of repeating her stress and she  is firmly against this idea.  She did not tolerate this well in the past.  Her RCRI risk of major cardiac event is 0.4%.  She is at low risk to proceed with surgery.  We will be available in the perioperative period if needed.    # Aorta atherosclerosis:  Noted on CT scan.  No aspirin given that she is on chronic warfarin.  Will check fasting lipids.   Signed, Skeet Latch, MD  01/16/2017 12:19 PM

## 2017-01-16 NOTE — Consult Note (Signed)
Reason for Consult: Left hip impending pathologic proximal femur fracture Referring Physician: Tyrell Antonio, MD  Alicia Vasquez is an 76 y.o. female.  HPI: 76 yo female recently seen in office and planning to have internal fixation of impending pathologic proximal femur fracture. Admitted to hospital this weekend due to increasing pain and inability to function around her house.  No recent trauma or falls. Complains of left hip pain with any movement or weight bearing particularly No fever chills or night sweats Does report recent increased swelling in her right lower leg, foot  Past Medical History:  Diagnosis Date  . Allergy   . Anxiety   . Arthritis   . Blood transfusion without reported diagnosis   . Breast cancer (Alicia Vasquez) 07/12/2013   Invasive Mammary Carcinoma  . DVT (deep vein thrombosis) in pregnancy ([place])   . Hypertension   . Hypothyroid   . Personal history of radiation therapy   . Pneumonia   . Radiation 11/21/13-01/07/14   Right Breast/Supraclavicular    Past Surgical History:  Procedure Laterality Date  . BREAST LUMPECTOMY WITH RADIOACTIVE SEED LOCALIZATION Right 09/09/2013   Procedure: BREAST LUMPECTOMY WITH RADIOACTIVE SEED LOCALIZATION WITH AXILLARY NODE EXCISION;  Surgeon: [name], MD;  Location: [place];  Service: General;  Laterality: Right;  . DENTAL SURGERY  04/19/2012   13 TEETH REMOVED  . DILATION AND CURETTAGE OF UTERUS    . RE-EXCISION OF BREAST LUMPECTOMY Right 09/24/2013   Procedure: RE-EXCISION OF RIGHT BREAST LUMPECTOMY;  Surgeon: [name], MD;  Location: [place];  Service: General;  Laterality: Right;    Family History  Problem Relation Age of Onset  . Heart disease Brother   . Colon cancer Brother   . Prostate cancer Brother     Social History:  reports that she has never smoked. She has never used smokeless tobacco. She reports that she does not drink alcohol or use drugs.  Allergies:   Allergies  Allergen Reactions  . Anesthetics, Amide Hypertension  . Benadryl [Diphenhydramine Hcl] Other (See Comments)    Dizziness  . Carbocaine [Mepivacaine Hcl] Hypertension  . Codeine Other (See Comments)    Dizziness  . Epinephrine Hypertension  . Sulfa Antibiotics Other (See Comments)    dizziness  . Tramadol     Sedation.   Marland Kitchen Penicillins Rash    Medications:  I have reviewed the patient's current medications. Scheduled: . enoxaparin (LOVENOX) injection  90 mg Subcutaneous BID  . levothyroxine  125 mcg Oral QAC breakfast  . lisinopril  10 mg Oral Daily    Results for orders placed or performed during the hospital encounter of 01/15/17 (from the past 24 hour(s))  CBC with Differential     Status: Abnormal   Collection Time: 01/15/17  8:00 PM  Result Value Ref Range   WBC 8.1 4.0 - 10.5 K/uL   RBC 4.39 3.87 - 5.11 MIL/uL   Hemoglobin 13.4 12.0 - 15.0 g/dL   HCT 41.0 36.0 - 46.0 %   MCV 93.4 78.0 - 100.0 fL   MCH 30.5 26.0 - 34.0 pg   MCHC 32.7 30.0 - 36.0 g/dL   RDW 13.9 11.5 - 15.5 %   Platelets 222 150 - 400 K/uL   Neutrophils Relative % 60 %   Neutro Abs 5.0 1.7 - 7.7 K/uL   Lymphocytes Relative 19 %   Lymphs Abs 1.5 0.7 - 4.0 K/uL   Monocytes Relative 18 %   Monocytes Absolute 1.5 (H) 0.1 -  1.0 K/uL   Eosinophils Relative 2 %   Eosinophils Absolute 0.1 0.0 - 0.7 K/uL   Basophils Relative 1 %   Basophils Absolute 0.0 0.0 - 0.1 K/uL  Basic metabolic panel     Status: Abnormal   Collection Time: 01/15/17  8:00 PM  Result Value Ref Range   Sodium 140 135 - 145 mmol/L   Potassium 5.1 3.5 - 5.1 mmol/L   Chloride 108 101 - 111 mmol/L   CO2 26 22 - 32 mmol/L   Glucose, Bld 103 (H) 65 - 99 mg/dL   BUN 19 6 - 20 mg/dL   Creatinine, Ser 0.75 0.44 - 1.00 mg/dL   Calcium 10.9 (H) 8.9 - 10.3 mg/dL   GFR calc non Af Amer >60 >60 mL/min   GFR calc Af Amer >60 >60 mL/min   Anion gap 6 5 - 15  Protime-INR     Status: Abnormal   Collection Time: 01/15/17  8:00 PM   Result Value Ref Range   Prothrombin Time 19.4 (H) 11.4 - 15.2 seconds   INR 1.62   Urinalysis, Routine w reflex microscopic     Status: Abnormal   Collection Time: 01/15/17  8:00 PM  Result Value Ref Range   Color, Urine STRAW (A) YELLOW   APPearance CLEAR CLEAR   Specific Gravity, Urine 1.008 1.005 - 1.030   pH 6.0 5.0 - 8.0   Glucose, UA NEGATIVE NEGATIVE mg/dL   Hgb urine dipstick SMALL (A) NEGATIVE   Bilirubin Urine NEGATIVE NEGATIVE   Ketones, ur NEGATIVE NEGATIVE mg/dL   Protein, ur NEGATIVE NEGATIVE mg/dL   Nitrite NEGATIVE NEGATIVE   Leukocytes, UA NEGATIVE NEGATIVE   RBC / HPF 0-5 0 - 5 RBC/hpf   WBC, UA 0-5 0 - 5 WBC/hpf   Bacteria, UA MANY (A) NONE SEEN   Squamous Epithelial / LPF 0-5 (A) NONE SEEN   Mucous PRESENT   APTT     Status: Abnormal   Collection Time: 01/15/17  8:00 PM  Result Value Ref Range   aPTT 48 (H) 24 - 36 seconds  Sedimentation rate     Status: None   Collection Time: 01/15/17  8:00 PM  Result Value Ref Range   Sed Rate 10 0 - 22 mm/hr  Type and screen [place]     Status: None   Collection Time: 01/16/17 12:35 AM  Result Value Ref Range   ABO/RH(D) O POS    Antibody Screen NEG    Sample Expiration 01/19/2017   ABO/Rh     Status: None   Collection Time: 01/16/17 12:35 AM  Result Value Ref Range   ABO/RH(D) O POS   CBC     Status: None   Collection Time: 01/16/17  1:32 AM  Result Value Ref Range   WBC 6.9 4.0 - 10.5 K/uL   RBC 4.15 3.87 - 5.11 MIL/uL   Hemoglobin 12.9 12.0 - 15.0 g/dL   HCT 38.8 36.0 - 46.0 %   MCV 93.5 78.0 - 100.0 fL   MCH 31.1 26.0 - 34.0 pg   MCHC 33.2 30.0 - 36.0 g/dL   RDW 13.9 11.5 - 15.5 %   Platelets 196 150 - 400 K/uL  Basic metabolic panel     Status: Abnormal   Collection Time: 01/16/17  1:32 AM  Result Value Ref Range   Sodium 142 135 - 145 mmol/L   Potassium 3.9 3.5 - 5.1 mmol/L   Chloride 109 101 - 111 mmol/L  CO2 27 22 - 32 mmol/L   Glucose, Bld 107 (H) 65 - 99 mg/dL    BUN 16 6 - 20 mg/dL   Creatinine, Ser 0.78 0.44 - 1.00 mg/dL   Calcium 10.3 8.9 - 10.3 mg/dL   GFR calc non Af Amer >60 >60 mL/min   GFR calc Af Amer >60 >60 mL/min   Anion gap 6 5 - 15  Troponin I     Status: None   Collection Time: 01/16/17  1:32 AM  Result Value Ref Range   Troponin I <0.03 <0.03 ng/mL    X-ray: CLINICAL DATA:  Left hip and groin pain. Left lower extremity numbness.  EXAM: DG HIP (WITH OR WITHOUT PELVIS) 2-3V LEFT  COMPARISON:  None.  FINDINGS: There is no evidence of acute fracture or dislocation. Left hip joint space width is preserved without significant arthropathic changes identified. No suspicious osseous lesion or soft tissue abnormality is seen.  IMPRESSION: Negative.   Electronically Signed   By: Logan Bores M.D.   On: 08/28/2016 16:09  CLINICAL DATA:  Low back pain.  Left hip and groin pain.  No injury.  EXAM: DG HIP (WITH OR WITHOUT PELVIS) 5+V BILAT  COMPARISON:  Left hip series 08/28/2016.  Lumbar spine 04/19/2016.  FINDINGS: Questionable sclerotic density noted over the proximal left femur. This was not present on prior hip series of 08/28/2016 . Degenerative changes both lower lumbar spine, SI joints, both hips. No acute abnormality . No evidence of fracture or dislocation. Tendinous ossification noted adjacent to the ischii.  IMPRESSION: 1. Questionable sclerotic lesion in the proximal left femur, not present on prior study of 08/28/2016. Whole-body bone scan with attention to the left proximal femur is suggested to further evaluate .  2. Degenerative changes lumbar spine, both SI joints, both hips. Diffuse osteopenia. No evidence of fracture or dislocation.   Electronically Signed   By: Marcello Moores  Register  CLINICAL DATA:  Stated history of pelvic mass.  Left hip pain.  EXAM: CT ABDOMEN AND PELVIS WITH CONTRAST  TECHNIQUE: Multidetector CT imaging of the abdomen and pelvis was performed using the  standard protocol following bolus administration of intravenous contrast.  CONTRAST:  100 cc Isovue-300 IV  COMPARISON:  None.  FINDINGS: Lower chest: Calcified granuloma in the right lower lobe. No consolidation or pleural fluid. Mild distal esophageal wall thickening.  Hepatobiliary: No focal liver abnormality is seen. No gallstones, gallbladder wall thickening, or biliary dilatation.  Pancreas: No ductal dilatation or inflammation.  Spleen: Scattered granuloma.  Normal in size.  Adrenals/Urinary Tract: Minimal thickening of the left adrenal gland without discrete nodule. Right adrenal gland is normal. No hydronephrosis or perinephric edema. No focal renal mass. Homogeneous enhancement with symmetric excretion on delayed phase imaging. Urinary bladder is minimally distended without wall thickening.  Stomach/Bowel: Thickening of the distal esophagus. Stomach is within normal limits. Appendix appears normal. No evidence of bowel wall thickening, distention, or inflammatory changes. Colonic diverticulosis without acute diverticulitis.  Vascular/Lymphatic: Aortic atherosclerosis without aneurysm. Subcentimeter porta hepatis node. No retroperitoneal or mesenteric adenopathy. No pelvic adenopathy.  Reproductive: Question of endometrial thickening, 14-16 mm. 14 mm left ovarian cyst. Right ovary is tentatively identified and quiescent.  Other: No ascites. No free air. Small fat containing umbilical hernia. No subcutaneous soft tissue mass.  Musculoskeletal: Lytic lesion in the left femoral neck measures at least 6.1 cm with marked thinning of the femoral cortex. Pathologic fracture through the femoral neck. Minimal T12 superior endplate compression fracture, age indeterminate.  No evidence of underlying focal lesion on CT. Multilevel degenerative disc disease and facet arthropathy of the lumbar spine. No evidence of additional focal bone  lesion.  IMPRESSION: 1. Lytic lesion in the left proximal femur with pathologic femoral neck fracture. This may be secondary to metastatic disease versus multiple myeloma/plasmacytoma in a patient of this age. 2. Probable endometrial thickening, abnormal for a postmenopausal patient. This may be endometrial neoplasm. Consider pelvic ultrasound for more detailed endometrial evaluation. Left ovarian cyst measuring 14 mm. Recommend yearly sonographic follow-up. 3. Thickening of the distal esophagus may be secondary to reflux or esophagitis. 4. Colonic diverticulosis without diverticulitis. Aortic Atherosclerosis (ICD10-I70.0). 5. Sequela of prior granulomatous disease with calcified right lung base nodule and splenic calcifications.   Electronically Signed   By: Jeb Levering M.D.  ROS  As noted in HPI Recent hip pain No fevers chills night sweats No productive or non-productive cough  Constitutional: Positive for weight loss. Negative for chills and diaphoresis.  HENT: Positive for hearing loss. Negative for ear discharge and nosebleeds.   Eyes: Negative for pain and discharge.  Respiratory: Positive for cough, shortness of breath and wheezing. Negative for hemoptysis and sputum production.   Cardiovascular: Positive for chest pain and leg swelling.  Gastrointestinal: Negative for abdominal pain and vomiting.  Genitourinary: Positive for dysuria and frequency.  Musculoskeletal: Positive for joint pain. Negative for falls.  Skin: Negative for itching and rash.  Neurological: Positive for focal weakness. Negative for sensory change.  Endo/Heme/Allergies: Positive for environmental allergies. Bruises/bleeds easily.  Psychiatric/Behavioral: Negative for hallucinations and substance abuse.  Blood pressure (!) 132/49, pulse 69, temperature (!) 97.5 F (36.4 C), temperature source Oral, resp. rate 17, height '5\' 4"'  (1.626 m), weight 93 kg (205 lb), SpO2 95 %.  Physical Exam   Awake alert Left hip slightly externally rotated NVI Pain with movement RLE with edema 2-3+  Eyes: PERRL, lids and conjunctivae normal ENMT: Mucous membranes are dry. Patient hard of hearing. Posterior pharynx clear of any exudate or lesions.  Neck: normal, supple, no masses, no thyromegaly Respiratory: Mildly decreased aeration with bibasilar crackles appreciated. No significant wheezes appreciated. Cardiovascular: Regular rate and rhythm, no murmurs / rubs / gallops. 1+ pitting edema of the right lower extremity and left. 2+ pedal pulses. No carotid bruits.  Abdomen: no tenderness, no masses palpated. No hepatosplenomegaly. Bowel sounds positive.  Musculoskeletal: no clubbing / cyanosis. Left hip externally rotated and mildly shortened. Pain with any movement of the left leg. Skin: no rashes, lesions, ulcers. No induration Neurologic: CN 2-12 grossly intact. Sensation intact, DTR normal. Strength 5/5 in all 4.  Psychiatric: Normal judgment and insight. Alert and oriented x 3. Normal mood.   Assessment/Plan: Left hip pathologic fracture, with history of breast Cancer 2015  Will take her to OR today for arthroplasty to address left hip findings Specimen will be sen to pathology for examination NPO Orders placed  Eustolia Drennen D 01/16/2017, 9:26 AM

## 2017-01-16 NOTE — Progress Notes (Signed)
PROGRESS NOTE    Alicia Vasquez  HWK:[id number] DOB: 25-Oct-1940 DOA: 01/15/2017 PCP: Briscoe Deutscher, DO    Brief Narrative: Alicia Vasquez is a 76 y.o. female with medical history significant of HTN, DVT on chronic anticoagulation, hyperparathyroidism, and breast cancer s/p partial masectomy and radiation; who presents with complaints of worsening left hip pain. Describes it as a sharp and dull pain that will radiate down her left leg. Patient reports symptoms have been ongoing for the last 8 months. She reports being evaluated and symptoms were reportedly attributed to a likely herniated disks. However, symptoms did not improve with conservative treatments. She was seen by [facility] orthopedics 12 days ago and had a MRI of the left hip. Review of report showed a large lesion of the left femoral neck, intertrochanteric region, greater/ lesser trochanters, a high-grade partial tear of the left hamstring tendon, and abnormally thickened endometrium. There was reportedly no fracture at that time and she was in the process of being scheduled with [name] or a hip replacement. However, in the last 2 days patient reports hip pain significantly worsened to the point which she was unable to ambulate. Denies any falls or significant trauma. Patient had recently been diagnosed with urinary tract infection 1 week, and had  just completed a course of ciprofloxacin yesterday. Patient reports that she stopped taking warfarin for a couple days while on the antibiotics. Her INRs were noted to be subtherapeutic. Associated symptoms include continued dysuria, urinary frequency, mild chest discomfort, mild shortness of breath, dry cough with mild wheeze, weight loss of approximately 30 pounds in last 6-8 months, and new right leg swelling.  ED Course: Upon admission into the emergency department patient was seen to be afebrile with blood pressures elevated to 164/96, and all the vital signs relatively within normal limits.  Labs revealed calcium 10.9 and INR 1.62. Urinalysis negative for signs of infection. CT scan of the left hip shows pathologic fracture along with physical findings to previous MRI.   Assessment & Plan:   Principal Problem:   Hip fracture, pathological (HCC) Active Problems:   SOB (shortness of breath), related to deconditioning   Hypothyroid   Hyperparathyroidism ([place])   Acute lower UTI   Endometrial hyperplasia   1-Pathological hip fracture: Acute. Recent MRI showing signs of a large 4.7 x 4.4 x 6.3cm lesion of the left femoral neck, intertrochanteric region, and greater/ lesser trochanters. Dr Alvan Dame planning surgery today.  Patient complaining of chest pain, dyspnea, EKG with ST changes inferior leads. I have consulted cardiology for pre op clearance.  If cardiology doesn't think chest pain is cardiac in origin, will need to consider CT angio to rule out PE.  Of note Patient received full dose Lovenox at 4;42 AM today.    2-Chest pain; dyspnea.  Troponin negative.  Cardiology consulted. Nitroglycerin PRN.    3-Right leg pain, edema; check doppler.   4-History of breast cancer; Dr Jana Hakim added to rounding list.   5-UTI; On ceftriaxone.  Follow urine culture.   Hyperparathyroidism: Followed by Dr. Layla Maw. For this and last seen on 12/06/2016. At that time patient had PTH of 26 and a calcium 10.6. Patient being treated with high-dose vitamin D. - Held vitamin D   Hypercalcemia:Chronic. Mild elevation in calcium to 10.9 on admission. Suspect this could be secondary history of hyperparathyroidism vs. metastic disease.  Stable.   Endometrium hyperplasia: Acute. Seen on MRI report. Question possible source of cancer cancer. - Check transvaginal ultrasound  Hypothyroidism:  Patient's last TSH was 1.31 on 12/05/16. - Continue levothyroxine  Essential hypertension - hold  Lisinopril pre-op.     DVT prophylaxis: Lovenox Code Status: Full code.  Family  Communication: Care discussed with patient.  Disposition Plan: to be determine,   Consultants:   Ortho  Cardiology     Procedures:  none   Antimicrobials: none   Subjective: Patient is still complaining of chest pain, since yesterday. Also report some dyspnea. Report pain is better today.  She report right leg swelling, soreness.   Objective: Vitals:   01/15/17 2116 01/15/17 2130 01/16/17 0100 01/16/17 0603  BP: (!) 163/82 (!) 151/73 (!) 155/73 (!) 132/49  Pulse: 74 78 73 69  Resp: _0 Temp:   (!) 97 F (36.1 C) (!) 97.5 F (36.4 C)  TempSrc:   Oral Oral  SpO2: 96% 96% 99% 95%  Weight:      Height:        Intake/Output Summary (Last 24 hours) at 01/16/17 1016 Last data filed at 01/16/17 0605  Gross per 24 hour  Intake           656.25 ml  Output              500 ml  Net           156.25 ml   Filed Weights   01/15/17 2107  Weight: 93 kg (205 lb)    Examination:  General exam: Appears calm and comfortable  Respiratory system: Clear to auscultation. Respiratory effort normal. Cardiovascular system: S1 & S2 heard, RRR. No JVD, murmurs, rubs, gallops or clicks. No pedal edema. Gastrointestinal system: Abdomen is nondistended, soft and nontender. No organomegaly or masses felt. Normal bowel sounds heard. Central nervous system: Alert and oriented. No focal neurological deficits. Extremities: Symmetric 5 x 5 power. Skin: No rashes, lesions or ulcers Psychiatry: Judgement and insight appear normal. Mood & affect appropriate.     Data Reviewed: I have personally reviewed following labs and imaging studies  CBC:  Recent Labs Lab 01/12/17 1423 01/15/17 2000 01/16/17 0132  WBC 6.1 8.1 6.9  NEUTROABS 3.7 5.0  --   HGB 13.6 13.4 12.9  HCT 41.1 41.0 38.8  MCV 94.9 93.4 93.5  PLT 218 222 292   Basic Metabolic Panel:  Recent Labs Lab 01/15/17 2000 01/16/17 0132  NA 140 142  K 5.1 3.9  CL 108 109  CO2 26 27  GLUCOSE 103* 107*  BUN 19 16    CREATININE 0.75 0.78  CALCIUM 10.9* 10.3   GFR: Estimated Creatinine Clearance: 65.1 mL/min (by C-G formula based on SCr of 0.78 mg/dL). Liver Function Tests: No results for input(s): AST, ALT, ALKPHOS, BILITOT, PROT, ALBUMIN in the last 168 hours. No results for input(s): LIPASE, AMYLASE in the last 168 hours. No results for input(s): AMMONIA in the last 168 hours. Coagulation Profile:  Recent Labs Lab 01/12/17 1423 01/15/17 2000  INR 1.20* 1.62  PROTIME 14.4*  --    Cardiac Enzymes:  Recent Labs Lab 01/16/17 0132  TROPONINI <0.03   BNP (last 3 results) No results for input(s): PROBNP in the last 8760 hours. HbA1C: No results for input(s): HGBA1C in the last 72 hours. CBG: No results for input(s): GLUCAP in the last 168 hours. Lipid Profile: No results for input(s): CHOL, HDL, LDLCALC, TRIG, CHOLHDL, LDLDIRECT in the last 72 hours. Thyroid Function Tests: No results for input(s): TSH, T4TOTAL, FREET4, T3FREE, THYROIDAB in the last 72 hours. Anemia Panel:  No results for input(s): VITAMINB12, FOLATE, FERRITIN, TIBC, IRON, RETICCTPCT in the last 72 hours. Sepsis Labs: No results for input(s): PROCALCITON, LATICACIDVEN in the last 168 hours.  Recent Results (from the past 240 hour(s))  Urine Culture     Status: None   Collection Time: 01/06/17  4:22 PM  Result Value Ref Range Status   Organism ID, Bacteria   Final    Three or more organisms present,each greater than 10,000 CFU/mL.These organisms,commonly found on external and internal genitalia,are considered to be colonizers.No further testing performed.          Radiology Studies: Ct Abdomen Pelvis W Contrast  Result Date: 01/15/2017 CLINICAL DATA:  Stated history of pelvic mass.  Left hip pain. EXAM: CT ABDOMEN AND PELVIS WITH CONTRAST TECHNIQUE: Multidetector CT imaging of the abdomen and pelvis was performed using the standard protocol following bolus administration of intravenous contrast. CONTRAST:   100 cc Isovue-300 IV COMPARISON:  None. FINDINGS: Lower chest: Calcified granuloma in the right lower lobe. No consolidation or pleural fluid. Mild distal esophageal wall thickening. Hepatobiliary: No focal liver abnormality is seen. No gallstones, gallbladder wall thickening, or biliary dilatation. Pancreas: No ductal dilatation or inflammation. Spleen: Scattered granuloma.  Normal in size. Adrenals/Urinary Tract: Minimal thickening of the left adrenal gland without discrete nodule. Right adrenal gland is normal. No hydronephrosis or perinephric edema. No focal renal mass. Homogeneous enhancement with symmetric excretion on delayed phase imaging. Urinary bladder is minimally distended without wall thickening. Stomach/Bowel: Thickening of the distal esophagus. Stomach is within normal limits. Appendix appears normal. No evidence of bowel wall thickening, distention, or inflammatory changes. Colonic diverticulosis without acute diverticulitis. Vascular/Lymphatic: Aortic atherosclerosis without aneurysm. Subcentimeter porta hepatis node. No retroperitoneal or mesenteric adenopathy. No pelvic adenopathy. Reproductive: Question of endometrial thickening, 14-16 mm. 14 mm left ovarian cyst. Right ovary is tentatively identified and quiescent. Other: No ascites. No free air. Small fat containing umbilical hernia. No subcutaneous soft tissue mass. Musculoskeletal: Lytic lesion in the left femoral neck measures at least 6.1 cm with marked thinning of the femoral cortex. Pathologic fracture through the femoral neck. Minimal T12 superior endplate compression fracture, age indeterminate. No evidence of underlying focal lesion on CT. Multilevel degenerative disc disease and facet arthropathy of the lumbar spine. No evidence of additional focal bone lesion. IMPRESSION: 1. Lytic lesion in the left proximal femur with pathologic femoral neck fracture. This may be secondary to metastatic disease versus multiple myeloma/plasmacytoma  in a patient of this age. 2. Probable endometrial thickening, abnormal for a postmenopausal patient. This may be endometrial neoplasm. Consider pelvic ultrasound for more detailed endometrial evaluation. Left ovarian cyst measuring 14 mm. Recommend yearly sonographic follow-up. 3. Thickening of the distal esophagus may be secondary to reflux or esophagitis. 4. Colonic diverticulosis without diverticulitis. Aortic Atherosclerosis (ICD10-I70.0). 5. Sequela of prior granulomatous disease with calcified right lung base nodule and splenic calcifications. Electronically Signed   By: Jeb Levering M.D.   On: 01/15/2017 22:38   Chest Portable 1 View  Result Date: 01/16/2017 CLINICAL DATA:  Shortness of breath. History of breast cancer, pneumonia. EXAM: PORTABLE CHEST 1 VIEW COMPARISON:  Chest radiograph June 14, 2016 FINDINGS: Cardiomediastinal silhouette is normal. No pleural effusions or focal consolidations. Mild pulmonary vascular congestion. Trachea projects midline and there is no pneumothorax. Soft tissue planes and included osseous structures are non-suspicious. Surgical clips RIGHT chest wall. Faint calcifications in neck are likely vascular. IMPRESSION: Mild pulmonary vascular congestion. Electronically Signed   By: Thana Farr.D.  On: 01/16/2017 00:49        Scheduled Meds: . chlorhexidine  60 mL Topical Once  . dexamethasone  8 mg Intravenous Once  . enoxaparin (LOVENOX) injection  90 mg Subcutaneous BID  . levothyroxine  125 mcg Oral QAC breakfast  . povidone-iodine  2 application Topical Once   Continuous Infusions: .  ceFAZolin (ANCEF) IV    . cefTRIAXone (ROCEPHIN)  IV Stopped (01/16/17 0239)  . methocarbamol (ROBAXIN)  IV       LOS: 1 day    Time spent: 35 minutes.     Elmarie Shiley, MD [facility] Pager [phone]  If 7PM-7AM, please contact night-coverage [email or web address] Password [email or web address] 01/16/2017, 10:16 AM

## 2017-01-16 NOTE — Anesthesia Postprocedure Evaluation (Signed)
Anesthesia Post Note  Patient: Alicia Vasquez  Procedure(s) Performed: Procedure(s) (LRB): TOTAL HIP ARTHROPLASTY POSTERIOR (Left)     Patient location during evaluation: PACU Anesthesia Type: General Level of consciousness: awake and alert Pain management: pain level controlled Vital Signs Assessment: post-procedure vital signs reviewed and stable Respiratory status: spontaneous breathing, nonlabored ventilation, respiratory function stable and patient connected to nasal cannula oxygen Cardiovascular status: blood pressure returned to baseline and stable Postop Assessment: no signs of nausea or vomiting Anesthetic complications: no    Last Vitals:  Vitals:   01/16/17 2015 01/16/17 2032  BP: 110/62 (!) 113/50  Pulse: 86 77  Resp: 13 16  Temp: 36.7 C 36.4 C  SpO2: 100% 100%    Last Pain:  Vitals:   01/16/17 2000  TempSrc:   PainSc: Asleep                 Arlin Savona

## 2017-01-16 NOTE — Anesthesia Preprocedure Evaluation (Deleted)
Anesthesia Evaluation  Patient identified by MRN, date of birth, ID band Patient awake    Reviewed: Allergy & Precautions, H&P , NPO status , Patient's Chart, lab work & pertinent test results  Airway Mallampati: II   Neck ROM: full    Dental   Pulmonary shortness of breath,           Cardiovascular hypertension,      Neuro/Psych Anxiety Depression  Neuromuscular disease    GI/Hepatic   Endo/Other  Hypothyroidism Morbid obesity  Renal/GU      Musculoskeletal  (+) Arthritis ,   Abdominal   Peds  Hematology   Anesthesia Other Findings   Reproductive/Obstetrics                            Anesthesia Physical  Anesthesia Plan  ASA: II  Anesthesia Plan: General   Post-op Pain Management:    Induction: Intravenous  PONV Risk Score and Plan:   Airway Management Planned: LMA  Additional Equipment:   Intra-op Plan:   Post-operative Plan:   Informed Consent: I have reviewed the patients History and Physical, chart, labs and discussed the procedure including the risks, benefits and alternatives for the proposed anesthesia with the patient or authorized representative who has indicated his/her understanding and acceptance.     Plan Discussed with: CRNA, Anesthesiologist and Surgeon  Anesthesia Plan Comments:                                          Anesthesia Evaluation  Patient identified by MRN, date of birth, ID band Patient awake    Reviewed: Allergy & Precautions, H&P , NPO status , Patient's Chart, lab work & pertinent test results  Airway Mallampati: II  Neck ROM: full    Dental   Pulmonary shortness of breath,          Cardiovascular hypertension,     Neuro/Psych Anxiety Depression  Neuromuscular disease    GI/Hepatic   Endo/Other  Hypothyroidism Morbid obesity  Renal/GU      Musculoskeletal  (+) Arthritis -,   Abdominal   Peds  Hematology   Anesthesia Other Findings   Reproductive/Obstetrics                           Anesthesia Physical Anesthesia Plan  ASA: II  Anesthesia Plan: General   Post-op Pain Management:    Induction: Intravenous  Airway Management Planned: LMA  Additional Equipment:   Intra-op Plan:   Post-operative Plan:   Informed Consent: I have reviewed the patients History and Physical, chart, labs and discussed the procedure including the risks, benefits and alternatives for the proposed anesthesia with the patient or authorized representative who has indicated his/her understanding and acceptance.     Plan Discussed with: CRNA, Anesthesiologist and Surgeon  Anesthesia Plan Comments:         Anesthesia Quick Evaluation  Anesthesia Quick Evaluation

## 2017-01-16 NOTE — Transfer of Care (Signed)
Immediate Anesthesia Transfer of Care Note  Patient: Alicia Vasquez  Procedure(s) Performed: Procedure(s): TOTAL HIP ARTHROPLASTY POSTERIOR (Left)  Patient Location: PACU  Anesthesia Type:General  Level of Consciousness:  sedated, patient cooperative and responds to stimulation  Airway & Oxygen Therapy:Patient Spontanous Breathing and Patient connected to face mask oxgen  Post-op Assessment:  Report given to PACU RN and Post -op Vital signs reviewed and stable  Post vital signs:  Reviewed and stable  Last Vitals:  Vitals:   01/16/17 1235 01/16/17 1914  BP: (!) 149/58   Pulse: 69   Resp: 17   Temp: 36.4 C 36.6 C  SpO2: 95%     Complications: No apparent anesthesia complications

## 2017-01-16 NOTE — Clinical Social Work Note (Signed)
Clinical Social Work Assessment  Patient Details  Name: Alicia Vasquez MRN: [id number] Date of Birth: Oct 20, 1940  Date of referral:  01/16/17               Reason for consult:  Discharge Planning                Permission sought to share information with:    Permission granted to share information::     Name::        Agency::     Relationship::     Contact Information:     Housing/Transportation Living arrangements for the past 2 months:  Single Family Home Source of Information:  Patient, Friend/Neighbor Patient Interpreter Needed:  None Criminal Activity/Legal Involvement Pertinent to Current Situation/Hospitalization:  No - Comment as needed Significant Relationships:  Friend Lives with:  Friends Do you feel safe going back to the place where you live?   (Unsure-PT recomendations pending) Need for family participation in patient care:     Care giving concerns:  No concerns reported at this time.   Social Worker assessment / plan:  Pt hospitalized from home on 01/15/17 with a pathological hip fracture. Surgery is scheduled for today. CSW met with pt / roommate at bedside to offer support and assistance with dc planning. Pt's plans following hospital dc are unclear at this time. [facility] Re[facility] vs [facility] services reviewed with pt / friend. CSW will meet again with pt following surgery and PT recommendations.  Employment status:  Retired Forensic scientist:  Medicare PT Recommendations:  Not assessed at this time Information / Referral to community resources:     Patient/Family's Response to care:  Disposition to be determined.  Patient/Family's Understanding of and Emotional Response to Diagnosis, Current Treatment, and Prognosis: Pt met with [name] this am and hip surgery is pending. " I'll be happy when it's over." Support provided. Pt appreciates CSW offer for assistance with dc planning.  Emotional Assessment Appearance:  Appears stated age Attitude/Demeanor/Rapport:  Other  (cooperative) Affect (typically observed):  Appropriate, Calm Orientation:  Oriented to Self, Oriented to Place, Oriented to  Time, Oriented to Situation Alcohol / Substance use:  Not Applicable Psych involvement (Current and /or in the community):  No (Comment)  Discharge Needs  Concerns to be addressed:  Discharge Planning Concerns Readmission within the last 30 days:  No Current discharge risk:  None Barriers to Discharge:  No Barriers Identified   [name], [place] 01/16/2017, 9:18 AM

## 2017-01-16 NOTE — Progress Notes (Signed)
*  Preliminary Results* Right lower extremity venous duplex completed. Right lower extremity is negative for deep vein thrombosis. There is no evidence of right Baker's cyst.  01/16/2017 3:05 PM  Maudry Mayhew, BS, RVT, RDCS, RDMS

## 2017-01-16 NOTE — Progress Notes (Signed)
ANTICOAGULATION CONSULT NOTE - Initial Consult  Pharmacy Consult for enoxaparin Indication: Warfarin bridge  Allergies  Allergen Reactions  . Anesthetics, Amide Hypertension  . Benadryl [Diphenhydramine Hcl] Other (See Comments)    Dizziness  . Carbocaine [Mepivacaine Hcl] Hypertension  . Codeine Other (See Comments)    Dizziness  . Epinephrine Hypertension  . Sulfa Antibiotics Other (See Comments)    dizziness  . Tramadol     Sedation.   Marland Kitchen Penicillins Rash    Patient Measurements: Height: 5\' 4"  (162.6 cm) Weight: 205 lb (93 kg) IBW/kg (Calculated) : 54.7 Heparin Dosing Weight:   Vital Signs: Temp: 97 F (36.1 C) (08/20 0100) Temp Source: Oral (08/20 0100) BP: 155/73 (08/20 0100) Pulse Rate: 73 (08/20 0100)  Labs:  Recent Labs  01/15/17 2000 01/16/17 0132  HGB 13.4 12.9  HCT 41.0 38.8  PLT 222 196  APTT 48*  --   LABPROT 19.4*  --   INR 1.62  --   CREATININE 0.75 0.78  TROPONINI  --  <0.03    Estimated Creatinine Clearance: 65.1 mL/min (by C-G formula based on SCr of 0.78 mg/dL).   Medical History: Past Medical History:  Diagnosis Date  . Allergy   . Anxiety   . Arthritis   . Blood transfusion without reported diagnosis   . Breast cancer ([place]) 07/12/2013   Invasive Mammary Carcinoma  . DVT (deep vein thrombosis) in pregnancy ([place])   . Hypertension   . Hypothyroid   . Personal history of radiation therapy   . Pneumonia   . Radiation 11/21/13-01/07/14   Right Breast/Supraclavicular    Medications:  Prescriptions Prior to Admission  Medication Sig Dispense Refill Last Dose  . acetaminophen (TYLENOL) 500 MG tablet Take 500 mg by mouth every 6 (six) hours as needed for mild pain, fever or headache. Reported on 09/08/2015   Past Week at Unknown time  . Cholecalciferol (VITAMIN D3) 5000 UNITS TABS Take 5,000 Units by mouth daily.    01/15/2017 at Unknown time  . HYDROcodone-acetaminophen (NORCO) 5-325 MG tablet Take 1 tablet by mouth every 6 (six)  hours as needed for moderate pain. (Patient taking differently: Take 0.5 tablets by mouth every 6 (six) hours as needed for moderate pain. ) 12 tablet 0 UNK  . levothyroxine (SYNTHROID) 125 MCG tablet Take 1 tablet (125 mcg total) by mouth daily before breakfast. Dispense as written: Synthroid LAST REFILL 90 tablet 3 01/15/2017 at Unknown time  . lisinopril (PRINIVIL,ZESTRIL) 10 MG tablet TAKE 1 TABLET (10 MG TOTAL) BY MOUTH DAILY. 90 tablet 1 01/15/2017 at Unknown time  . warfarin (JANTOVEN) 5 MG tablet TAKE AS DIRECTED BY ANTICOAGULATION CLINIC (Patient taking differently: Take 7.5-10 mg by mouth See admin instructions. TAKE AS DIRECTED BY ANTICOAGULATION CLINIC. 7.5 MG MWF&SUN 10 MG TUE,THURS,SAT) 60 tablet 3 01/14/2017 at 2030  . Diclofenac Sodium (PENNSAID) 2 % SOLN Place 1 application onto the skin daily. (Patient not taking: Reported on 01/15/2017) 8 g 0 Not Taking at Unknown time  . gabapentin (NEURONTIN) 100 MG capsule Take 1 capsule (100 mg total) by mouth at bedtime. (Patient not taking: Reported on 01/15/2017) 90 capsule 3 Not Taking at Unknown time   Scheduled:  . enoxaparin (LOVENOX) injection  90 mg Subcutaneous BID  . levothyroxine  125 mcg Oral QAC breakfast  . lisinopril  10 mg Oral Daily    Assessment: Patient with chronic warfarin use for DVT.  Last dose noted 8/18 at 1800, INR on admit 1.62.  MD wants enoxaparin  for warfarin bridging.  Goal of Therapy:  Anti-Xa level 0.6-1 units/ml 4hrs after LMWH dose given Monitor platelets by anticoagulation protocol: Yes   Plan:  Enoxaparin 90mg  sq q12hr  Tyler Deis, Journee Kohen [place] 01/16/2017,3:12 AM

## 2017-01-16 NOTE — Interval H&P Note (Signed)
History and Physical Interval Note:  01/16/2017 4:31 PM  Alicia Vasquez  has presented today for surgery, with the diagnosis of pathologic left hip fracture  The various methods of treatment have been discussed with the patient and family. After consideration of risks, benefits and other options for treatment, the patient has consented to  Procedure(s): TOTAL HIP ARTHROPLASTY POSTERIOR (Left) as a surgical intervention .  The patient's history has been reviewed, patient examined, no change in status, stable for surgery.  I have reviewed the patient's chart and labs.  Questions were answered to the patient's satisfaction.     Mauri Pole

## 2017-01-16 NOTE — Anesthesia Preprocedure Evaluation (Addendum)
Anesthesia Evaluation  Patient identified by MRN, date of birth, ID band Patient awake    Reviewed: Allergy & Precautions, H&P , NPO status , Patient's Chart, lab work & pertinent test results  Airway Mallampati: II   Neck ROM: full    Dental   Pulmonary shortness of breath,    Pulmonary exam normal        Cardiovascular hypertension, Normal cardiovascular exam     Neuro/Psych Anxiety Depression  Neuromuscular disease    GI/Hepatic   Endo/Other  Hypothyroidism Morbid obesity  Renal/GU      Musculoskeletal  (+) Arthritis ,   Abdominal   Peds  Hematology   Anesthesia Other Findings   Reproductive/Obstetrics                            Anesthesia Physical  Anesthesia Plan  ASA: III  Anesthesia Plan: General   Post-op Pain Management:    Induction: Intravenous  PONV Risk Score and Plan: 2 and Ondansetron, Dexamethasone, Treatment may vary due to age or medical condition and Midazolam  Airway Management Planned: LMA  Additional Equipment:   Intra-op Plan:   Post-operative Plan:   Informed Consent: I have reviewed the patients History and Physical, chart, labs and discussed the procedure including the risks, benefits and alternatives for the proposed anesthesia with the patient or authorized representative who has indicated his/her understanding and acceptance.     Plan Discussed with: CRNA, Anesthesiologist and Surgeon  Anesthesia Plan Comments:         Anesthesia Quick Evaluation

## 2017-01-16 NOTE — Telephone Encounter (Signed)
Called pt regarding need to know correct warfarin dose.  Pt confirms that she is taking 7.5mg  warfarin on Mon, Wed, Fri and Sun.  10 mg Tues, Thurs and Sat.  Pt states she is currently in the hospital awaiting surgery d/t fracture.  This RN will inform Dr Jana Hakim.

## 2017-01-16 NOTE — Brief Op Note (Signed)
01/15/2017 - 01/16/2017  6:34 PM  PATIENT:  Alicia Vasquez  76 y.o. female  PRE-OPERATIVE DIAGNOSIS:  pathologic left hip fracture, femoral neck  POST-OPERATIVE DIAGNOSIS:  left hip pathologic fracture, femoral neck  PROCEDURE:  Procedure(s): TOTAL HIP ARTHROPLASTY POSTERIOR (Left)  SURGEON:  Surgeon(s) and Role:    Paralee Cancel, MD - Primary  PHYSICIAN ASSISTANT: Danae Orleans, PA-C  ANESTHESIA:   general  EBL:  Total I/O In: 5391 [I.V.:1225] Out: 2650 [Urine:1500; Blood:1150]  BLOOD ADMINISTERED:none  DRAINS: none   LOCAL MEDICATIONS USED:  NONE  SPECIMEN:  Source of Specimen:  left proximal hip bone and lesion  DISPOSITION OF SPECIMEN:  PATHOLOGY  COUNTS:  YES  TOURNIQUET:  * No tourniquets in log *  DICTATION: .Other Dictation: Dictation Number [id number]  PLAN OF CARE: Admit to inpatient   PATIENT DISPOSITION:  PACU - hemodynamically stable.   Delay start of Pharmacological VTE agent (>24hrs) due to surgical blood loss or risk of bleeding: yes

## 2017-01-16 NOTE — Anesthesia Procedure Notes (Deleted)
Performed by: Margeaux Swantek J       

## 2017-01-16 NOTE — Progress Notes (Signed)
CM will continue to follow pt for discharge plans.

## 2017-01-16 NOTE — H&P (View-Only) (Signed)
Reason for Consult: Left hip impending pathologic proximal femur fracture Referring Physician: Tyrell Antonio, MD  Alicia Vasquez is an 76 y.o. female.  HPI: 76 yo female recently seen in office and planning to have internal fixation of impending pathologic proximal femur fracture. Admitted to hospital this weekend due to increasing pain and inability to function around her house.  No recent trauma or falls. Complains of left hip pain with any movement or weight bearing particularly No fever chills or night sweats Does report recent increased swelling in her right lower leg, foot  Past Medical History:  Diagnosis Date  . Allergy   . Anxiety   . Arthritis   . Blood transfusion without reported diagnosis   . 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 DVT (deep vein thrombosis) in pregnancy ([place])   . Hypertension   . Hypothyroid   . Personal history of radiation therapy   . Pneumonia   . Radiation 11/21/13-01/07/14   Right Breast/Supraclavicular    Past Surgical History:  Procedure Laterality Date  . BREAST LUMPECTOMY WITH RADIOACTIVE SEED LOCALIZATION Right 09/09/2013   Procedure: BREAST LUMPECTOMY WITH RADIOACTIVE SEED LOCALIZATION WITH AXILLARY NODE EXCISION;  Surgeon: [name], MD;  Location: [facility];  Service: General;  Laterality: Right;  . DENTAL SURGERY  04/19/2012   13 TEETH REMOVED  . DILATION AND CURETTAGE OF UTERUS    . RE-EXCISION OF BREAST LUMPECTOMY Right 09/24/2013   Procedure: RE-EXCISION OF RIGHT BREAST LUMPECTOMY;  Surgeon: [name], MD;  Location: [facility];  Service: General;  Laterality: Right;    Family History  Problem Relation Age of Onset  . Heart disease Brother   . Colon cancer Brother   . Prostate cancer Brother     Social History:  reports that she has never smoked. She has never used smokeless tobacco. She reports that she does not drink alcohol or use drugs.  Allergies:   Allergies  Allergen Reactions  . Anesthetics, Amide Hypertension  . Benadryl [Diphenhydramine Hcl] Other (See Comments)    Dizziness  . Carbocaine [Mepivacaine Hcl] Hypertension  . Codeine Other (See Comments)    Dizziness  . Epinephrine Hypertension  . Sulfa Antibiotics Other (See Comments)    dizziness  . Tramadol     Sedation.   Marland Kitchen Penicillins Rash    Medications:  I have reviewed the patient's current medications. Scheduled: . enoxaparin (LOVENOX) injection  90 mg Subcutaneous BID  . levothyroxine  125 mcg Oral QAC breakfast  . lisinopril  10 mg Oral Daily    Results for orders placed or performed during the hospital encounter of 01/15/17 (from the past 24 hour(s))  CBC with Differential     Status: Abnormal   Collection Time: 01/15/17  8:00 PM  Result Value Ref Range   WBC 8.1 4.0 - 10.5 K/uL   RBC 4.39 3.87 - 5.11 MIL/uL   Hemoglobin 13.4 12.0 - 15.0 g/dL   HCT 41.0 36.0 - 46.0 %   MCV 93.4 78.0 - 100.0 fL   MCH 30.5 26.0 - 34.0 pg   MCHC 32.7 30.0 - 36.0 g/dL   RDW 13.9 11.5 - 15.5 %   Platelets 222 150 - 400 K/uL   Neutrophils Relative % 60 %   Neutro Abs 5.0 1.7 - 7.7 K/uL   Lymphocytes Relative 19 %   Lymphs Abs 1.5 0.7 - 4.0 K/uL   Monocytes Relative 18 %   Monocytes Absolute 1.5 (H) 0.1 -  1.0 K/uL   Eosinophils Relative 2 %   Eosinophils Absolute 0.1 0.0 - 0.7 K/uL   Basophils Relative 1 %   Basophils Absolute 0.0 0.0 - 0.1 K/uL  Basic metabolic panel     Status: Abnormal   Collection Time: 01/15/17  8:00 PM  Result Value Ref Range   Sodium 140 135 - 145 mmol/L   Potassium 5.1 3.5 - 5.1 mmol/L   Chloride 108 101 - 111 mmol/L   CO2 26 22 - 32 mmol/L   Glucose, Bld 103 (H) 65 - 99 mg/dL   BUN 19 6 - 20 mg/dL   Creatinine, Ser 0.75 0.44 - 1.00 mg/dL   Calcium 10.9 (H) 8.9 - 10.3 mg/dL   GFR calc non Af Amer >60 >60 mL/min   GFR calc Af Amer >60 >60 mL/min   Anion gap 6 5 - 15  Protime-INR     Status: Abnormal   Collection Time: 01/15/17  8:00 PM   Result Value Ref Range   Prothrombin Time 19.4 (H) 11.4 - 15.2 seconds   INR 1.62   Urinalysis, Routine w reflex microscopic     Status: Abnormal   Collection Time: 01/15/17  8:00 PM  Result Value Ref Range   Color, Urine STRAW (A) YELLOW   APPearance CLEAR CLEAR   Specific Gravity, Urine 1.008 1.005 - 1.030   pH 6.0 5.0 - 8.0   Glucose, UA NEGATIVE NEGATIVE mg/dL   Hgb urine dipstick SMALL (A) NEGATIVE   Bilirubin Urine NEGATIVE NEGATIVE   Ketones, ur NEGATIVE NEGATIVE mg/dL   Protein, ur NEGATIVE NEGATIVE mg/dL   Nitrite NEGATIVE NEGATIVE   Leukocytes, UA NEGATIVE NEGATIVE   RBC / HPF 0-5 0 - 5 RBC/hpf   WBC, UA 0-5 0 - 5 WBC/hpf   Bacteria, UA MANY (A) NONE SEEN   Squamous Epithelial / LPF 0-5 (A) NONE SEEN   Mucous PRESENT   APTT     Status: Abnormal   Collection Time: 01/15/17  8:00 PM  Result Value Ref Range   aPTT 48 (H) 24 - 36 seconds  Sedimentation rate     Status: None   Collection Time: 01/15/17  8:00 PM  Result Value Ref Range   Sed Rate 10 0 - 22 mm/hr  Type and screen [place]     Status: None   Collection Time: 01/16/17 12:35 AM  Result Value Ref Range   ABO/RH(D) O POS    Antibody Screen NEG    Sample Expiration 01/19/2017   ABO/Rh     Status: None   Collection Time: 01/16/17 12:35 AM  Result Value Ref Range   ABO/RH(D) O POS   CBC     Status: None   Collection Time: 01/16/17  1:32 AM  Result Value Ref Range   WBC 6.9 4.0 - 10.5 K/uL   RBC 4.15 3.87 - 5.11 MIL/uL   Hemoglobin 12.9 12.0 - 15.0 g/dL   HCT 38.8 36.0 - 46.0 %   MCV 93.5 78.0 - 100.0 fL   MCH 31.1 26.0 - 34.0 pg   MCHC 33.2 30.0 - 36.0 g/dL   RDW 13.9 11.5 - 15.5 %   Platelets 196 150 - 400 K/uL  Basic metabolic panel     Status: Abnormal   Collection Time: 01/16/17  1:32 AM  Result Value Ref Range   Sodium 142 135 - 145 mmol/L   Potassium 3.9 3.5 - 5.1 mmol/L   Chloride 109 101 - 111 mmol/L  CO2 27 22 - 32 mmol/L   Glucose, Bld 107 (H) 65 - 99 mg/dL    BUN 16 6 - 20 mg/dL   Creatinine, Ser 0.78 0.44 - 1.00 mg/dL   Calcium 10.3 8.9 - 10.3 mg/dL   GFR calc non Af Amer >60 >60 mL/min   GFR calc Af Amer >60 >60 mL/min   Anion gap 6 5 - 15  Troponin I     Status: None   Collection Time: 01/16/17  1:32 AM  Result Value Ref Range   Troponin I <0.03 <0.03 ng/mL    X-ray: CLINICAL DATA:  Left hip and groin pain. Left lower extremity numbness.  EXAM: DG HIP (WITH OR WITHOUT PELVIS) 2-3V LEFT  COMPARISON:  None.  FINDINGS: There is no evidence of acute fracture or dislocation. Left hip joint space width is preserved without significant arthropathic changes identified. No suspicious osseous lesion or soft tissue abnormality is seen.  IMPRESSION: Negative.   Electronically Signed   By: Logan Bores M.D.   On: 08/28/2016 16:09  CLINICAL DATA:  Low back pain.  Left hip and groin pain.  No injury.  EXAM: DG HIP (WITH OR WITHOUT PELVIS) 5+V BILAT  COMPARISON:  Left hip series 08/28/2016.  Lumbar spine 04/19/2016.  FINDINGS: Questionable sclerotic density noted over the proximal left femur. This was not present on prior hip series of 08/28/2016 . Degenerative changes both lower lumbar spine, SI joints, both hips. No acute abnormality . No evidence of fracture or dislocation. Tendinous ossification noted adjacent to the ischii.  IMPRESSION: 1. Questionable sclerotic lesion in the proximal left femur, not present on prior study of 08/28/2016. Whole-body bone scan with attention to the left proximal femur is suggested to further evaluate .  2. Degenerative changes lumbar spine, both SI joints, both hips. Diffuse osteopenia. No evidence of fracture or dislocation.   Electronically Signed   By: Marcello Moores  Register  CLINICAL DATA:  Stated history of pelvic mass.  Left hip pain.  EXAM: CT ABDOMEN AND PELVIS WITH CONTRAST  TECHNIQUE: Multidetector CT imaging of the abdomen and pelvis was performed using the  standard protocol following bolus administration of intravenous contrast.  CONTRAST:  100 cc Isovue-300 IV  COMPARISON:  None.  FINDINGS: Lower chest: Calcified granuloma in the right lower lobe. No consolidation or pleural fluid. Mild distal esophageal wall thickening.  Hepatobiliary: No focal liver abnormality is seen. No gallstones, gallbladder wall thickening, or biliary dilatation.  Pancreas: No ductal dilatation or inflammation.  Spleen: Scattered granuloma.  Normal in size.  Adrenals/Urinary Tract: Minimal thickening of the left adrenal gland without discrete nodule. Right adrenal gland is normal. No hydronephrosis or perinephric edema. No focal renal mass. Homogeneous enhancement with symmetric excretion on delayed phase imaging. Urinary bladder is minimally distended without wall thickening.  Stomach/Bowel: Thickening of the distal esophagus. Stomach is within normal limits. Appendix appears normal. No evidence of bowel wall thickening, distention, or inflammatory changes. Colonic diverticulosis without acute diverticulitis.  Vascular/Lymphatic: Aortic atherosclerosis without aneurysm. Subcentimeter porta hepatis node. No retroperitoneal or mesenteric adenopathy. No pelvic adenopathy.  Reproductive: Question of endometrial thickening, 14-16 mm. 14 mm left ovarian cyst. Right ovary is tentatively identified and quiescent.  Other: No ascites. No free air. Small fat containing umbilical hernia. No subcutaneous soft tissue mass.  Musculoskeletal: Lytic lesion in the left femoral neck measures at least 6.1 cm with marked thinning of the femoral cortex. Pathologic fracture through the femoral neck. Minimal T12 superior endplate compression fracture, age indeterminate.  No evidence of underlying focal lesion on CT. Multilevel degenerative disc disease and facet arthropathy of the lumbar spine. No evidence of additional focal bone  lesion.  IMPRESSION: 1. Lytic lesion in the left proximal femur with pathologic femoral neck fracture. This may be secondary to metastatic disease versus multiple myeloma/plasmacytoma in a patient of this age. 2. Probable endometrial thickening, abnormal for a postmenopausal patient. This may be endometrial neoplasm. Consider pelvic ultrasound for more detailed endometrial evaluation. Left ovarian cyst measuring 14 mm. Recommend yearly sonographic follow-up. 3. Thickening of the distal esophagus may be secondary to reflux or esophagitis. 4. Colonic diverticulosis without diverticulitis. Aortic Atherosclerosis (ICD10-I70.0). 5. Sequela of prior granulomatous disease with calcified right lung base nodule and splenic calcifications.   Electronically Signed   By: Jeb Levering M.D.  ROS  As noted in HPI Recent hip pain No fevers chills night sweats No productive or non-productive cough  Constitutional: Positive for weight loss. Negative for chills and diaphoresis.  HENT: Positive for hearing loss. Negative for ear discharge and nosebleeds.   Eyes: Negative for pain and discharge.  Respiratory: Positive for cough, shortness of breath and wheezing. Negative for hemoptysis and sputum production.   Cardiovascular: Positive for chest pain and leg swelling.  Gastrointestinal: Negative for abdominal pain and vomiting.  Genitourinary: Positive for dysuria and frequency.  Musculoskeletal: Positive for joint pain. Negative for falls.  Skin: Negative for itching and rash.  Neurological: Positive for focal weakness. Negative for sensory change.  Endo/Heme/Allergies: Positive for environmental allergies. Bruises/bleeds easily.  Psychiatric/Behavioral: Negative for hallucinations and substance abuse.  Blood pressure (!) 132/49, pulse 69, temperature (!) 97.5 F (36.4 C), temperature source Oral, resp. rate 17, height '5\' 4"'  (1.626 m), weight 93 kg (205 lb), SpO2 95 %.  Physical Exam   Awake alert Left hip slightly externally rotated NVI Pain with movement RLE with edema 2-3+  Eyes: PERRL, lids and conjunctivae normal ENMT: Mucous membranes are dry. Patient hard of hearing. Posterior pharynx clear of any exudate or lesions.  Neck: normal, supple, no masses, no thyromegaly Respiratory: Mildly decreased aeration with bibasilar crackles appreciated. No significant wheezes appreciated. Cardiovascular: Regular rate and rhythm, no murmurs / rubs / gallops. 1+ pitting edema of the right lower extremity and left. 2+ pedal pulses. No carotid bruits.  Abdomen: no tenderness, no masses palpated. No hepatosplenomegaly. Bowel sounds positive.  Musculoskeletal: no clubbing / cyanosis. Left hip externally rotated and mildly shortened. Pain with any movement of the left leg. Skin: no rashes, lesions, ulcers. No induration Neurologic: CN 2-12 grossly intact. Sensation intact, DTR normal. Strength 5/5 in all 4.  Psychiatric: Normal judgment and insight. Alert and oriented x 3. Normal mood.   Assessment/Plan: Left hip pathologic fracture, with history of breast Cancer 2015  Will take her to OR today for arthroplasty to address left hip findings Specimen will be sen to pathology for examination NPO Orders placed  Keano Guggenheim D 01/16/2017, 9:26 AM

## 2017-01-17 ENCOUNTER — Encounter (HOSPITAL_COMMUNITY): Payer: Self-pay | Admitting: Orthopedic Surgery

## 2017-01-17 ENCOUNTER — Inpatient Hospital Stay (HOSPITAL_COMMUNITY): Payer: Medicare Other

## 2017-01-17 ENCOUNTER — Encounter: Payer: Medicare Other | Admitting: Physical Therapy

## 2017-01-17 DIAGNOSIS — R0600 Dyspnea, unspecified: Secondary | ICD-10-CM

## 2017-01-17 LAB — PREPARE RBC (CROSSMATCH)

## 2017-01-17 LAB — ECHOCARDIOGRAM COMPLETE
Height: 64 in
WEIGHTICAEL: 3280 [oz_av]

## 2017-01-17 LAB — LIPID PANEL
Cholesterol: 125 mg/dL (ref 0–200)
HDL: 32 mg/dL — ABNORMAL LOW
LDL Cholesterol: 80 mg/dL (ref 0–99)
Total CHOL/HDL Ratio: 3.9 ratio
Triglycerides: 65 mg/dL
VLDL: 13 mg/dL (ref 0–40)

## 2017-01-17 LAB — BASIC METABOLIC PANEL
ANION GAP: 7 (ref 5–15)
BUN: 14 mg/dL (ref 6–20)
CALCIUM: 9.4 mg/dL (ref 8.9–10.3)
CO2: 23 mmol/L (ref 22–32)
Chloride: 110 mmol/L (ref 101–111)
Creatinine, Ser: 0.73 mg/dL (ref 0.44–1.00)
GLUCOSE: 179 mg/dL — AB (ref 65–99)
POTASSIUM: 4.2 mmol/L (ref 3.5–5.1)
Sodium: 140 mmol/L (ref 135–145)

## 2017-01-17 LAB — CBC
HCT: 26.4 % — ABNORMAL LOW (ref 36.0–46.0)
Hemoglobin: 8.8 g/dL — ABNORMAL LOW (ref 12.0–15.0)
MCH: 31.5 pg (ref 26.0–34.0)
MCHC: 33.3 g/dL (ref 30.0–36.0)
MCV: 94.6 fL (ref 78.0–100.0)
Platelets: 187 10*3/uL (ref 150–400)
RBC: 2.79 MIL/uL — ABNORMAL LOW (ref 3.87–5.11)
RDW: 14.1 % (ref 11.5–15.5)
WBC: 10.2 10*3/uL (ref 4.0–10.5)

## 2017-01-17 LAB — POCT I-STAT 4, (NA,K, GLUC, HGB,HCT)
GLUCOSE: 120 mg/dL — AB (ref 65–99)
HEMATOCRIT: 32 % — AB (ref 36.0–46.0)
Hemoglobin: 10.9 g/dL — ABNORMAL LOW (ref 12.0–15.0)
POTASSIUM: 3.9 mmol/L (ref 3.5–5.1)
SODIUM: 142 mmol/L (ref 135–145)

## 2017-01-17 LAB — PROTIME-INR
INR: 1.62
Prothrombin Time: 19.4 seconds — ABNORMAL HIGH (ref 11.4–15.2)

## 2017-01-17 LAB — TROPONIN I

## 2017-01-17 MED ORDER — PERFLUTREN LIPID MICROSPHERE
1.0000 mL | INTRAVENOUS | Status: DC | PRN
  Filled 2017-01-17: qty 10

## 2017-01-17 MED ORDER — VITAMIN D3 125 MCG (5000 UT) PO TABS: 5000.0000 [IU] | ORAL_TABLET | Freq: Every day | ORAL | Status: DC

## 2017-01-17 MED ORDER — PERFLUTREN LIPID MICROSPHERE
INTRAVENOUS | Status: AC
  Filled 2017-01-17: qty 10

## 2017-01-17 MED ORDER — VITAMIN D3 25 MCG (1000 UNIT) PO TABS
5000.0000 [IU] | ORAL_TABLET | Freq: Every day | ORAL | Status: DC
  Administered 2017-01-17 – 2017-01-21 (×5): 5000 [IU] via ORAL
  Filled 2017-01-17 (×5): qty 5

## 2017-01-17 MED ORDER — SODIUM CHLORIDE 0.9 % IV SOLN
INTRAVENOUS | Status: DC
  Administered 2017-01-17 (×2): via INTRAVENOUS

## 2017-01-17 MED ORDER — SODIUM CHLORIDE 0.9 % IV SOLN
Freq: Once | INTRAVENOUS | Status: AC
  Administered 2017-01-17: 10:00:00 via INTRAVENOUS

## 2017-01-17 NOTE — Progress Notes (Signed)
COURTESY NOTE;  Am aware of patient's admission and surgery, path should be available within 24-48 hours, will review and discuss with her then.  Appreciate your help to this patient!  GM  BREST CANCER SUMMARY: 76 y.o. 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 It also predicts no benefit from chemotherapy  (3) adjuvant radiation completed 01/07/2014  (4) anastrozole started 02/27/2014 stopped within 2 weeks because of arm swelling.              (a) bone density April 2016 showed osteopenia, with a t-score of -1.6             (b) anastrozole resumed 12/17/2015  (5) history of left lower extremity DVT 11/23/2012, initially on [place] ovarian, which causes chest pain, switch to Coumadin July 2014

## 2017-01-17 NOTE — Progress Notes (Addendum)
PROGRESS NOTE    Alicia Vasquez  KZL:[id number] DOB: Feb 02, 1941 DOA: 01/15/2017 PCP: Briscoe Deutscher, DO    Brief Narrative: Alicia Vasquez is a 76 y.o. female with medical history significant of HTN, DVT on chronic anticoagulation, hyperparathyroidism, and breast cancer s/p partial masectomy and radiation; who presents with complaints of worsening left hip pain. Describes it as a sharp and dull pain that will radiate down her left leg. Patient reports symptoms have been ongoing for the last 8 months. She reports being evaluated and symptoms were reportedly attributed to a likely herniated disks. However, symptoms did not improve with conservative treatments. She was seen by [facility] 12 days ago and had a MRI of the left hip. Review of report showed a large lesion of the left femoral neck, intertrochanteric region, greater/ lesser trochanters, a high-grade partial tear of the left hamstring tendon, and abnormally thickened endometrium. There was reportedly no fracture at that time and she was in the process of being scheduled with [name] or a hip replacement. However, in the last 2 days patient reports hip pain significantly worsened to the point which she was unable to ambulate. Denies any falls or significant trauma. Patient had recently been diagnosed with urinary tract infection 1 week, and had  just completed a course of ciprofloxacin yesterday. Patient reports that she stopped taking warfarin for a couple days while on the antibiotics. Her INRs were noted to be subtherapeutic. Associated symptoms include continued dysuria, urinary frequency, mild chest discomfort, mild shortness of breath, dry cough with mild wheeze, weight loss of approximately 30 pounds in last 6-8 months, and new right leg swelling.  ED Course: Upon admission into the emergency department patient was seen to be afebrile with blood pressures elevated to 164/96, and all the vital signs relatively within normal limits.  Labs revealed calcium 10.9 and INR 1.62. Urinalysis negative for signs of infection. CT scan of the left hip shows pathologic fracture along with physical findings to previous MRI.   Assessment & Plan:   Principal Problem:   Hip fracture, pathological ([place]) Active Problems:   Shortness of breath   Hypothyroid   Hyperparathyroidism ([name])   Acute lower UTI   Endometrial hyperplasia   1-Pathological hip fracture: Acute. Recent MRI showing signs of a large 4.7 x 4.4 x 6.3cm lesion of the left femoral neck, intertrochanteric region, and greater/ lesser trochanters. Patient underwent left total hip arthroplasty on 01-17-2017 for pathologic left femoral neck fracture.  Patient was evaluated by cardiology for pre op clearance because patient was complaining of chest pain.  Follow Biopsy results.   2-Chest pain; dyspnea.  Troponin negative. Per cardiology evaluation this is chronic.  Cardiology consulted and following.  Nitroglycerin PRN.    3-Right leg pain, edema; doppler negative for DVT>   4-History of breast cancer; Dr Jana Hakim  Will follow up on patient.   5-UTI; On ceftriaxone.  Follow urine culture.   History of DVT ; On coumadin.  INR at 1.6.  Will discussed with Ortho anticoagulation.  Coumadin to be resume 8-22. Needs to discussed with ortho if ok to bridge with lovenox.  Addendum; discussed with Dr Alvan Dame, will hold on starting lovenox at this time.   Acute Blood loss anemia.  Post op. Hb drop from 12--8. Patient is symptomatic with dizziness. Will proceed with one unit PRBC.  Hyperparathyroidism: Followed by Dr. Layla Maw. For this and last seen on 12/06/2016. At that time patient had PTH of 26 and a calcium 10.6.  Patient being treated with high-dose vitamin D. - resume  vitamin D   Hypercalcemia:Chronic. Mild elevation in calcium to 10.9 on admission. Suspect this could be secondary history of hyperparathyroidism vs. metastic disease.  Stable.   Endometrium  hyperplasia: Acute. Seen on MRI report. Question possible source of cancer cancer. - transvaginal ultrasound with endometrial thickening. Follow biopsy results from femur and follow Dr Jana Hakim recommendation.   Hypothyroidism: Patient's last TSH was 1.31 on 12/05/16. - Continue levothyroxine  Essential hypertension - hold  Lisinopril pre-op.     DVT prophylaxis: Lovenox Code Status: Full code.  Family Communication: Care discussed with patient.  Disposition Plan: to be determine,   Consultants:   Ortho  Cardiology     Procedures:  none   Antimicrobials: none   Subjective: She is complaining today with tingling on right writs and hand.  Denies weakness.  She is feeling weak and Dizzy when she lean forward.   Objective: Vitals:   01/16/17 2015 01/16/17 2032 01/16/17 2210 01/17/17 0430  BP: 110/62 (!) 113/50 (!) 105/55 (!) 100/48  Pulse: 86 77 78 86  Resp: '13 16 16 14  ' Temp: 98 F (36.7 C) 97.6 F (36.4 C) 97.7 F (36.5 C) 97.9 F (36.6 C)  TempSrc:   Oral Oral  SpO2: 100% 100% 100% 98%  Weight:      Height:        Intake/Output Summary (Last 24 hours) at 01/17/17 0946 Last data filed at 01/17/17 0600  Gross per 24 hour  Intake             3130 ml  Output             3170 ml  Net              -40 ml   Filed Weights   01/15/17 2107  Weight: 93 kg (205 lb)    Examination:  General exam; NAD Respiratory system: CTA Cardiovascular system: S1 & S2 heard, RRR. No JVD, murmurs, rubs, gallops or clicks. No pedal edema. Gastrointestinal system: Abdomen is soft, nt  Central nervous system: Alert and oriented.  Extremities: Symmetric 5 x 5 power. Right LE with clean dressing.  Skin: No rashes, lesions or ulcers Psychiatry: Judgement and insight appear normal. Mood & affect appropriate.     Data Reviewed: I have personally reviewed following labs and imaging studies  CBC:  Recent Labs Lab 01/12/17 1423 01/15/17 2000 01/16/17 0132 01/16/17 1846  01/17/17 0259  WBC 6.1 8.1 6.9  --  10.2  NEUTROABS 3.7 5.0  --   --   --   HGB 13.6 13.4 12.9 10.9* 8.8*  HCT 41.1 41.0 38.8 32.0* 26.4*  MCV 94.9 93.4 93.5  --  94.6  PLT 218 222 196  --  570   Basic Metabolic Panel:  Recent Labs Lab 01/15/17 2000 01/16/17 0132 01/16/17 1846 01/17/17 0259  NA 140 142 142 140  K 5.1 3.9 3.9 4.2  CL 108 109  --  110  CO2 26 27  --  23  GLUCOSE 103* 107* 120* 179*  BUN 19 16  --  14  CREATININE 0.75 0.78  --  0.73  CALCIUM 10.9* 10.3  --  9.4   GFR: Estimated Creatinine Clearance: 65.1 mL/min (by C-G formula based on SCr of 0.73 mg/dL). Liver Function Tests: No results for input(s): AST, ALT, ALKPHOS, BILITOT, PROT, ALBUMIN in the last 168 hours. No results for input(s): LIPASE, AMYLASE in the last 168 hours. No  results for input(s): AMMONIA in the last 168 hours. Coagulation Profile:  Recent Labs Lab 01/12/17 1423 01/15/17 2000 01/16/17 0956  INR 1.20* 1.62 1.53  PROTIME 14.4*  --   --    Cardiac Enzymes:  Recent Labs Lab 01/16/17 0132 01/16/17 1101 01/16/17 2106 01/17/17 0259  TROPONINI <0.03 <0.03 <0.03 <0.03   BNP (last 3 results) No results for input(s): PROBNP in the last 8760 hours. HbA1C: No results for input(s): HGBA1C in the last 72 hours. CBG: No results for input(s): GLUCAP in the last 168 hours. Lipid Profile:  Recent Labs  01/17/17 0259  CHOL 125  HDL 32*  LDLCALC 80  TRIG 65  CHOLHDL 3.9   Thyroid Function Tests: No results for input(s): TSH, T4TOTAL, FREET4, T3FREE, THYROIDAB in the last 72 hours. Anemia Panel: No results for input(s): VITAMINB12, FOLATE, FERRITIN, TIBC, IRON, RETICCTPCT in the last 72 hours. Sepsis Labs: No results for input(s): PROCALCITON, LATICACIDVEN in the last 168 hours.  Recent Results (from the past 240 hour(s))  Urine culture     Status: None (Preliminary result)   Collection Time: 01/15/17  8:33 PM  Result Value Ref Range Status   Specimen Description URINE,  RANDOM  Final   Special Requests NONE  Final   Culture   Final    CULTURE REINCUBATED FOR BETTER GROWTH Performed at [facility], [address] [place], [place] [address]    Report Status PENDING  Incomplete  Surgical pcr screen     Status: None   Collection Time: 01/16/17  2:00 PM  Result Value Ref Range Status   MRSA, PCR NEGATIVE NEGATIVE Final   Staphylococcus aureus NEGATIVE NEGATIVE Final    Comment:        The Xpert SA Assay (FDA approved for NASAL specimens in patients over 59 years of age), is one component of a comprehensive surveillance program.  Test performance has been validated by [facility] for patients greater than or equal to 76 year old. It is not intended to diagnose infection nor to guide or monitor treatment.          Radiology Studies: US Transvaginal Non-ob  Result Date: 01/17/2017 CLINICAL DATA:  Abnormal CT EXAM: TRANSABDOMINAL AND TRANSVAGINAL ULTRASOUND OF PELVIS TECHNIQUE: Both transabdominal and transvaginal ultrasound examinations of the pelvis were performed. Transabdominal technique was performed for global imaging of the pelvis including uterus, ovaries, adnexal regions, and pelvic cul-de-sac. It was necessary to proceed with endovaginal exam following the transabdominal exam to visualize the uterus, endometrium, ovaries and adnexa . COMPARISON:  CT 01/15/2017 FINDINGS: Uterus Measurements: 6.2 x 3.3 x 4.6 cm. No fibroids or other mass visualized. Endometrium Thickness: Thickened, measuring 15 mm, heterogeneous appearance. Right ovary Measurements: 1.6 x 1.1 x 1.2 cm. Normal appearance/no adnexal mass. Left ovary Measurements: 2.6 x 1.7 x 1.8 cm. 1.5 x 1.2 x 1.1 cm simple appearing cyst noted in the left ovary. Other findings No abnormal free fluid. IMPRESSION: Endometrial thickness is considered abnormal for an asymptomatic post-menopausal female. Endometrial sampling should be considered to exclude carcinoma. 1.5 cm simple appearing cyst  in the left ovary, likely benign. This could be followed with repeat ultrasound in 1 year. Electronically Signed   By: Rolm Baptise M.D.   On: 01/17/2017 08:48   US Pelvis Complete  Result Date: 01/17/2017 CLINICAL DATA:  Abnormal CT EXAM: TRANSABDOMINAL AND TRANSVAGINAL ULTRASOUND OF PELVIS TECHNIQUE: Both transabdominal and transvaginal ultrasound examinations of the pelvis were performed. Transabdominal technique was performed for global  imaging of the pelvis including uterus, ovaries, adnexal regions, and pelvic cul-de-sac. It was necessary to proceed with endovaginal exam following the transabdominal exam to visualize the uterus, endometrium, ovaries and adnexa . COMPARISON:  CT 01/15/2017 FINDINGS: Uterus Measurements: 6.2 x 3.3 x 4.6 cm. No fibroids or other mass visualized. Endometrium Thickness: Thickened, measuring 15 mm, heterogeneous appearance. Right ovary Measurements: 1.6 x 1.1 x 1.2 cm. Normal appearance/no adnexal mass. Left ovary Measurements: 2.6 x 1.7 x 1.8 cm. 1.5 x 1.2 x 1.1 cm simple appearing cyst noted in the left ovary. Other findings No abnormal free fluid. IMPRESSION: Endometrial thickness is considered abnormal for an asymptomatic post-menopausal female. Endometrial sampling should be considered to exclude carcinoma. 1.5 cm simple appearing cyst in the left ovary, likely benign. This could be followed with repeat ultrasound in 1 year. Electronically Signed   By: Rolm Baptise M.D.   On: 01/17/2017 08:48   Ct Abdomen Pelvis W Contrast  Result Date: 01/15/2017 CLINICAL DATA:  Stated history of pelvic mass.  Left hip pain. EXAM: CT ABDOMEN AND PELVIS WITH CONTRAST TECHNIQUE: Multidetector CT imaging of the abdomen and pelvis was performed using the standard protocol following bolus administration of intravenous contrast. CONTRAST:  100 cc Isovue-300 IV COMPARISON:  None. FINDINGS: Lower chest: Calcified granuloma in the right lower lobe. No consolidation or pleural fluid. Mild distal  esophageal wall thickening. Hepatobiliary: No focal liver abnormality is seen. No gallstones, gallbladder wall thickening, or biliary dilatation. Pancreas: No ductal dilatation or inflammation. Spleen: Scattered granuloma.  Normal in size. Adrenals/Urinary Tract: Minimal thickening of the left adrenal gland without discrete nodule. Right adrenal gland is normal. No hydronephrosis or perinephric edema. No focal renal mass. Homogeneous enhancement with symmetric excretion on delayed phase imaging. Urinary bladder is minimally distended without wall thickening. Stomach/Bowel: Thickening of the distal esophagus. Stomach is within normal limits. Appendix appears normal. No evidence of bowel wall thickening, distention, or inflammatory changes. Colonic diverticulosis without acute diverticulitis. Vascular/Lymphatic: Aortic atherosclerosis without aneurysm. Subcentimeter porta hepatis node. No retroperitoneal or mesenteric adenopathy. No pelvic adenopathy. Reproductive: Question of endometrial thickening, 14-16 mm. 14 mm left ovarian cyst. Right ovary is tentatively identified and quiescent. Other: No ascites. No free air. Small fat containing umbilical hernia. No subcutaneous soft tissue mass. Musculoskeletal: Lytic lesion in the left femoral neck measures at least 6.1 cm with marked thinning of the femoral cortex. Pathologic fracture through the femoral neck. Minimal T12 superior endplate compression fracture, age indeterminate. No evidence of underlying focal lesion on CT. Multilevel degenerative disc disease and facet arthropathy of the lumbar spine. No evidence of additional focal bone lesion. IMPRESSION: 1. Lytic lesion in the left proximal femur with pathologic femoral neck fracture. This may be secondary to metastatic disease versus multiple myeloma/plasmacytoma in a patient of this age. 2. Probable endometrial thickening, abnormal for a postmenopausal patient. This may be endometrial neoplasm. Consider pelvic  ultrasound for more detailed endometrial evaluation. Left ovarian cyst measuring 14 mm. Recommend yearly sonographic follow-up. 3. Thickening of the distal esophagus may be secondary to reflux or esophagitis. 4. Colonic diverticulosis without diverticulitis. Aortic Atherosclerosis (ICD10-I70.0). 5. Sequela of prior granulomatous disease with calcified right lung base nodule and splenic calcifications. Electronically Signed   By: Jeb Levering M.D.   On: 01/15/2017 22:38   Chest Portable 1 View  Result Date: 01/16/2017 CLINICAL DATA:  Shortness of breath. History of breast cancer, pneumonia. EXAM: PORTABLE CHEST 1 VIEW COMPARISON:  Chest radiograph June 14, 2016 FINDINGS: Cardiomediastinal silhouette is normal.  No pleural effusions or focal consolidations. Mild pulmonary vascular congestion. Trachea projects midline and there is no pneumothorax. Soft tissue planes and included osseous structures are non-suspicious. Surgical clips RIGHT chest wall. Faint calcifications in neck are likely vascular. IMPRESSION: Mild pulmonary vascular congestion. Electronically Signed   By: Elon Alas M.D.   On: 01/16/2017 00:49   Dg Hip Port Unilat With Pelvis 1v Left  Result Date: 01/16/2017 CLINICAL DATA:  Status post left hip replacement. EXAM: DG HIP (WITH OR WITHOUT PELVIS) 1V PORT LEFT COMPARISON:  None. FINDINGS: The patient is status post left hip replacement. Acetabular and femoral components are in good position. Postoperative air seen in the soft tissues. IMPRESSION: Left hip replacement as above. Electronically Signed   By: Dorise Bullion III M.D   On: 01/16/2017 19:52        Scheduled Meds: . docusate sodium  100 mg Oral BID  . ferrous sulfate  325 mg Oral TID PC  . levothyroxine  125 mcg Oral QAC breakfast  . [START ON 01/18/2017] warfarin  7.5 mg Oral q1800  . [START ON 01/18/2017] Warfarin - Physician Dosing Inpatient   Does not apply q1800   Continuous Infusions: . cefTRIAXone  (ROCEPHIN)  IV Stopped (01/16/17 2237)  . methocarbamol (ROBAXIN)  IV Stopped (01/16/17 1932)     LOS: 2 days    Time spent: 35 minutes.     Elmarie Shiley, MD [facility] Pager [phone]  If 7PM-7AM, please contact night-coverage [email or web address] Password [facility] 01/17/2017, 9:46 AM

## 2017-01-17 NOTE — Evaluation (Signed)
Physical Therapy Evaluation Patient Details Name: Alicia Vasquez MRN: [id number] DOB: 1941-02-09 Today's Date: 01/17/2017   History of Present Illness  Pt admitted s/p L femoral neck pathological fx with posterior THR 01/16/17.  Pt with hx of breast CA  Clinical Impression  Pt s/p L THR and presents with decreased L LE strength/ROM, post op pain, PWB and posterior THP.  Pt would benefit from follow up rehab at SNF level to maximize IND and safety prior to return home with ltd assist.    Follow Up Recommendations SNF    Equipment Recommendations  None recommended by PT    Recommendations for Other Services OT consult     Precautions / Restrictions Precautions Precautions: Posterior Hip;Fall Precaution Booklet Issued: Yes (comment) Precaution Comments: Reviewed x 2 and sign posted in room Restrictions Weight Bearing Restrictions: Yes LLE Weight Bearing: Partial weight bearing LLE Partial Weight Bearing Percentage or Pounds: 50%      Mobility  Bed Mobility               General bed mobility comments: NT - deferred with pt c/o dizziness bringing head up in bed - BP 94/48 - RN aware  Transfers                 General transfer comment: NT 2* low BP  Ambulation/Gait             General Gait Details: NT 2* low BP  Stairs            Wheelchair Mobility    Modified Rankin (Stroke Patients Only)       Balance                                             Pertinent Vitals/Pain Pain Assessment: Faces Faces Pain Scale: Hurts little more Pain Location: L hip, groin and ankle Pain Descriptors / Indicators: Aching;Sore Pain Intervention(s): Limited activity within patient's tolerance;Monitored during session;Premedicated before session;Ice applied    Home Living Family/patient expects to be discharged to:: Skilled nursing facility                 Additional Comments: Pt lives with elderly friend    Prior Function Level  of Independence: Independent with assistive device(s)         Comments: Pt ambulating with RW prior to surgery but struggling      Hand Dominance   Dominant Hand: Right    Extremity/Trunk Assessment   Upper Extremity Assessment Upper Extremity Assessment: Overall WFL for tasks assessed    Lower Extremity Assessment Lower Extremity Assessment: LLE deficits/detail LLE Deficits / Details: Strength at hip 2-/5 with AAROM at hip to 70 flex and 10 abd       Communication   Communication: HOH  Cognition Arousal/Alertness: Awake/alert Behavior During Therapy: WFL for tasks assessed/performed Overall Cognitive Status: Within Functional Limits for tasks assessed                                        General Comments      Exercises Total Joint Exercises Ankle Circles/Pumps: AROM;Both;15 reps;Supine Heel Slides: AAROM;Left;15 reps;Supine Hip ABduction/ADduction: AAROM;Left;10 reps;Supine   Assessment/Plan    PT Assessment Patient needs continued PT services  PT Problem List Decreased strength;Decreased range of motion;Decreased  activity tolerance;Decreased balance;Decreased mobility;Decreased knowledge of use of DME;Obesity;Pain       PT Treatment Interventions DME instruction;Gait training;Stair training;Functional mobility training;Therapeutic activities;Therapeutic exercise;Balance training;Patient/family education    PT Goals (Current goals can be found in the Care Plan section)  Acute Rehab PT Goals Patient Stated Goal: Regain IND PT Goal Formulation: With patient Time For Goal Achievement: 01/28/17 Potential to Achieve Goals: Good    Frequency Min 5X/week   Barriers to discharge        Co-evaluation               AM-PAC PT "6 Clicks" Daily Activity  Outcome Measure Difficulty turning over in bed (including adjusting bedclothes, sheets and blankets)?: Unable Difficulty moving from lying on back to sitting on the side of the bed? :  Unable Difficulty sitting down on and standing up from a chair with arms (e.g., wheelchair, bedside commode, etc,.)?: Unable Help needed moving to and from a bed to chair (including a wheelchair)?: Total Help needed walking in hospital room?: Total Help needed climbing 3-5 steps with a railing? : Total 6 Click Score: 6    End of Session   Activity Tolerance: Patient limited by fatigue;Patient limited by pain;Other (comment) (dizziness with BP 94/48 in bed) Patient left: in bed;with call bell/phone within reach;with family/visitor present Nurse Communication: Other (comment) (BP 94/48 with dizziness) PT Visit Diagnosis: Pain;Dizziness and giddiness (R42);Muscle weakness (generalized) (M62.81) Pain - Right/Left: Left Pain - part of body: Hip;Leg    Time: 1035-1105 PT Time Calculation (min) (ACUTE ONLY): 30 min   Charges:   PT Evaluation $PT Eval Low Complexity: 1 Low PT Treatments $Therapeutic Exercise: 8-22 mins   PT G Codes:        Pg [phone]   Lashaunta Sicard 01/17/2017, 12:46 PM

## 2017-01-17 NOTE — Progress Notes (Addendum)
  Echocardiogram 2D Echocardiogram has been performed.  Attempted echo, although patient unable to turn in LLD postion. Definity unable to be utilized at current time due to current blood/medication IV. Checked with nurse.   Alicia Vasquez 01/17/2017, 3:03 PM

## 2017-01-17 NOTE — Progress Notes (Signed)
CSW spoke with patient regarding PT recommendation for SNF. Patient reported that she is agreeable and prefers [facility]. CSW completed FL2 and will provide bed offers. CSW continuing to follow to assist with discharge planning.  Abundio Miu, [place] Social Worker [facility] Cell#: [phone]

## 2017-01-17 NOTE — Op Note (Signed)
NAME:  Alicia Vasquez, Alicia Vasquez                     ACCOUNT NO.:  MEDICAL RECORD NO.:  [id number]  LOCATION:                                 FACILITY:  PHYSICIAN:  Pietro Cassis. Alvan Dame, M.D.  DATE OF BIRTH:  1940-11-19  DATE OF PROCEDURE:  01/16/2017 DATE OF DISCHARGE:                              OPERATIVE REPORT   PREOPERATIVE DIAGNOSIS:  Pathologic left femoral neck fracture.  POSTOPERATIVE DIAGNOSIS:  Pathologic left femoral neck fracture.  PROCEDURE:  Left total hip arthroplasty.  COMPONENTS USED:  A 50-mm Pinnacle shell, 32+ 4 neutral AltrX liner, a size 15 large stature AML prosthesis with a 32+ 5 Delta ceramic ball.  SURGEON:  Pietro Cassis. Alvan Dame, M.D.  ASSISTANT:  Danae Orleans, PA-C.  Noted that [name] was present for the entirety of the case from preoperative positioning, perioperative management of the operative extremity, general facilitation of the case and primary wound closure.  ANESTHESIA:  General.  BLOOD LOSS:  1200 mL.  DRAINS:  None.  SPECIMENS/FINDINGS:  The patient's left proximal femur was noted to be completely taken over by tumor-appearing material and tissue, which was removed.  All bone and soft tissue in this area were sent to Pathology for evaluation.  She has had history of breast cancer in 2015.  INDICATIONS FOR PROCEDURE:  Alicia Vasquez is a 76 year old female who had been recently seen in our office for left hip pain.  Radiographs revealed some lucency and then subsequent MRI was ordered, which revealed a large fluid-base signal in the left proximal femoral neck inter-troch area.  I had seen her in the office and was planning to take her to the operating room for stabilization of an impending fracture based on the amount of pain that she was experiencing.  Unfortunately, on the 19th of August, she presented to the emergency room with increasing pain without trauma.  She was admitted to the Medical Service, she was seen and evaluated.  CT scan had  indicated a nondisplaced fracture involving the femoral neck in the basicervical region.  I discussed with her that at this point, we ought to proceed with total hip arthroplasty as opposed to intramedullary nail and brought the most stability and bypassed this area, allow for further debridement to bulking of any potential metastatic lesion in this area or primary lesion.  Consent was obtained for benefit of pain relief and treatment.  Plans were to send a specimen to Pathology to help with Oncology treatment management.  Consent obtained.  PROCEDURE IN DETAIL:  The patient was brought to the operative theater. Once adequate anesthesia, preoperative antibiotics, Ancef administered, she was positioned into the right lateral decubitus position with the left side up.  The left lower extremity was then prepped and draped in sterile fashion.  Time-out was performed identifying the patient, planned procedure and extremity.  The patient's landmarks were identified.  An incision was made for posterior approach to the hip.  Soft tissue dissection was carried to the iliotibial band and gluteal fascia, which was then split for posterior approach.  I dissected down to the posterior capsular region.  I then performed a capsulotomy to preserving the posterior  leaflet.  There was small bloody hemarthrosis; however, she was noted to be fairly vascular in this area and had received 90 mg of Lovenox in 4 a.m.  It was difficult to tell if she had an acute fracture with hemarthrosis versus bleeding from the dissection of the hip.  I then rotated the hip, identifying the fracture line.  I used an oscillating saw and created a napkin ring excision of the femoral neck. Again, this bone was noted to be significantly pathologic, was extremely soft.  All the bone and tissue were removed and saved for specimen.  The femoral head was then removed using the corkscrew and sent to Pathology as well.  At  this point, I rolled the hip to 90 degrees, placed retractors for proximal hip exposure.  At this point, we identified significant pathologic bone as well as other tissue and then used a large curette and curetted the upper third of the proximal femur again saving all of it as specimen.  Once I had this area adequately debrided and debulked, I began preparing for an AML prosthesis to bypass this area.  Her femoral neck down to and then probably including the lesser trochanter were significantly affected with this pathology.  This made it somewhat of a challenge; however, using a fully porous- coated prosthesis, we were going to bypass this entire segment.  I then began reaming with a 10-mm reamer, reamed up to 14.5 mm by power where I got good purchase at appropriate depth.  I then broached first with 13.5 small stature, then large stature, then 15 small, then 15 large stature prosthesis.  Once I did this, I packed the femur as the femur was extremely vascular. I packed this and now attended to the acetabulum.  Retractors were placed and the femur was gently retracted anteriorly and statically held to try to prevent any complication due to the soft nature of the bone. The posterior aspect of the acetabulum was exposed.  The labrum debrided as well as other soft tissue within the fovea.  I then began reaming with a 46 reamer and reamed only up to the 49 reamer and selected the 50- mm cup.  A 50-mm cup was inserted using a curved impactor, it was tamped into what appeared to be a more anatomic position and checked with the hip guide and found to be about 15-20 degrees of anteversion and 40 degrees of abduction.  Two cancellous screws were placed in the ilium following followup impaction.  Her bone quality was noted to be very substandard.  At this point, I elected to place a trial liner to perform a trial reduction to assess the anteversion of the femoral stem that was necessary.   At this point, I did a trial reduction with a 15 large stature broach and neck and a 32, 1 ball.  At this point, I felt that the components were in adequate position and provide combined anteversion for stability.  Given these findings, I removed all trial components including the trial liner, placed a hole eliminator into the acetabulum followed by the 32+ 4 neutral AltrX liner.  The size 15 large-stature AML stem was then opened.  Utilizing a reamer as a guide handle, we impacted the stem to the point where it was no longer moving.  This was about the level of the broach where the broach sat.  I did go ahead and do a retrial and ended up selecting a 32+ 5 ball.  The hip was very  stable throughout a range of motion.  There was no evidence of any impingement with forward flexion and internal rotation or in the sleep position.  Her leg lengths appeared to be equal compared to the down leg.  There was no evidence of impingement with external rotation and extension.  Given all these findings, we irrigated the wound final time as we had done throughout the case.  I reapproximated the posterior capsule back to the superior capsular tissue as well as to the area of the piriformis.  At the time of closure, her bleeding had subsided substantially given the fact that we had an acetabular component placed as well as the femoral stem in place as the vast majority of her oozing was bone.  At this point, the iliotibial band and gluteal fascia were reapproximated using #1 Vicryl and 0 V-Loc suture.  The remainder of the wound was closed with 2-0 Vicryl and then a running 4-0 Monocryl.  The hip was cleaned, dried and dressed sterilely using surgical glue and Aquacel dressing.  She was brought to the recovery room in stable condition tolerating the procedure well, findings were reviewed with family.  I will make her partial weightbearing at this point based on the bone quality acetabulum to prevent  any complications, allow for adequate bony ingrowth.  She is on the Medical Service, will make certain that they consult her oncologist, Dr. Jana Hakim for his assistant at least to get followup plans.  If radiation treatment is recommended of the proximal femur, then, I would like for this to be done 2-3 weeks postop to allow for adequate wound healing.     Pietro Cassis Alvan Dame, M.D.     MDO/MEDQ  D:  01/16/2017  T:  01/16/2017  Job:  [id number]

## 2017-01-17 NOTE — Progress Notes (Signed)
ANTICOAGULATION CONSULT NOTE - Initial Consult  Pharmacy Consult for warfarin Indication: h/o DVT  Allergies  Allergen Reactions  . Anesthetics, Amide Hypertension  . Benadryl [Diphenhydramine Hcl] Other (See Comments)    Dizziness  . Carbocaine [Mepivacaine Hcl] Hypertension  . Codeine Other (See Comments)    Dizziness  . Epinephrine Hypertension  . Sulfa Antibiotics Other (See Comments)    dizziness  . Tramadol     Sedation.   Marland Kitchen Penicillins Rash    Patient Measurements: Height: 5\' 4"  (162.6 cm) Weight: 205 lb (93 kg) IBW/kg (Calculated) : 54.7  Vital Signs: Temp: 97.9 F (36.6 C) (08/21 0430) Temp Source: Oral (08/21 0430) BP: 100/48 (08/21 0430) Pulse Rate: 86 (08/21 0430)  Labs:  Recent Labs  01/15/17 2000  01/16/17 0132 01/16/17 0956 01/16/17 1101 01/16/17 1846 01/16/17 2106 01/17/17 0259  HGB 13.4  --  12.9  --   --  10.9*  --  8.8*  HCT 41.0  --  38.8  --   --  32.0*  --  26.4*  PLT 222  --  196  --   --   --   --  187  APTT 48*  --   --   --   --   --   --   --   LABPROT 19.4*  --   --  18.6*  --   --   --   --   INR 1.62  --   --  1.53  --   --   --   --   CREATININE 0.75  --  0.78  --   --   --   --  0.73  TROPONINI  --   < > <0.03  --  <0.03  --  <0.03 <0.03  < > = values in this interval not displayed.  Estimated Creatinine Clearance: 65.1 mL/min (by C-G formula based on SCr of 0.73 mg/dL).   Medical History: Past Medical History:  Diagnosis Date  . Allergy   . Anxiety   . Arthritis   . Blood transfusion without reported diagnosis   . Breast cancer ([place]) 07/12/2013   Invasive Mammary Carcinoma  . DVT (deep vein thrombosis) in pregnancy ([place])   . Hypertension   . Hypothyroid   . Personal history of radiation therapy   . Pneumonia   . Radiation 11/21/13-01/07/14   Right Breast/Supraclavicular     Assessment: 87 yoF on chronic warfarin therapy for history of DVT in 2014, history of breast cancer admitted with hip fracture. Warfarin  was held (last dose 8/18) and patient started on Lovenox bridge on admission.  Patient underwent left THA 8/20 afternoon.  Last dose of Lovenox 1 mg/kg given early AM of 8/20 and INR 1.53 the morning of surgery.  Due to ABLA during procedure, postop anticoagulation is to be delayed until 8/22 with warfarin.  TRH consulted pharmacy to manage.  PTA warfarin regimen reported as 7.5 mg daily except 10 mg on Tues/Thurs/Saturdays.  INR was subtherapeutic on admission at 1.62.  Today, 01/17/2017: - POD#1. Per ortho instruction, hold off on resuming anticoagulation until 8/22 (POD#2). - INR ordered for today in process - Hgb 8.8 - decreased due to expected blood loss following surgery, platelets WNL  Goal of Therapy:  INR 2-3   Plan:  Defer plans to resume Lovenox bridge to ortho. F/u INR and plan to resume warfarin 8/22. Daily INR.  Hershal Coria 01/17/2017,9:56 AM

## 2017-01-17 NOTE — Progress Notes (Signed)
Patient ID: Alicia Vasquez, female   DOB: [birth year], 76 y.o.   MRN: [id number] Subjective: 1 Day Post-Op Procedure(s) (LRB): TOTAL HIP ARTHROPLASTY POSTERIOR (Left)    Patient reports pain as moderate.  But doing ok.  Felt weak with initial PT eval  Objective:   VITALS:   Vitals:   01/17/17 1308 01/17/17 1541  BP: (!) 106/53 (!) 113/53  Pulse: 90 100  Resp: 16 16  Temp: 98 F (36.7 C) 98.3 F (36.8 C)  SpO2: 97% 97%    Neurovascular intact Incision: dressing C/D/I  LABS  Recent Labs  01/15/17 2000 01/16/17 0132 01/16/17 1846 01/17/17 0259  HGB 13.4 12.9 10.9* 8.8*  HCT 41.0 38.8 32.0* 26.4*  WBC 8.1 6.9  --  10.2  PLT 222 196  --  187     Recent Labs  01/15/17 2000 01/16/17 0132 01/16/17 1846 01/17/17 0259  NA 140 142 142 140  K 5.1 3.9 3.9 4.2  BUN 19 16  --  14  CREATININE 0.75 0.78  --  0.73  GLUCOSE 103* 107* 120* 179*     Recent Labs  01/16/17 0956 01/17/17 1111  INR 1.53 1.62     Assessment/Plan: 1 Day Post-Op Procedure(s) (LRB): TOTAL HIP ARTHROPLASTY POSTERIOR (Left)   Advance diet Up with therapy, PWB LLE  ABLA, symptomatic this am with PT - medical service to order transfusion, IVF, IRON Pathology status pending - [place]at on board Will follow and arrange for Ortho follow up

## 2017-01-17 NOTE — Progress Notes (Signed)
OT Cancellation Note  Patient Details Name: Alicia Vasquez MRN: [id number] DOB: August 10, 1940   Cancelled Treatment:    Reason Eval/Treat Not Completed: Other (comment).  Pt was limited by low BP with PT. Plan to see her tomorrow.  Alwyn Cordner 01/17/2017, 1:47 PM  Lesle Chris, OTR/L [phone] 01/17/2017

## 2017-01-17 NOTE — NC FL2 (Signed)
[place] LEVEL OF CARE SCREENING TOOL     IDENTIFICATION  Patient Name: Alicia Vasquez Birthdate: 1940/06/18 Sex: female Admission Date (Current Location): 01/15/2017  [facility] and [place] Number:  Herbalist and Address:  [facility],  [place] [address], [place]      Provider Number: [id number]  Attending Physician Name and Address:  Elmarie Shiley, MD  Relative Name and Phone Number:       Current Level of Care: Hospital Recommended Level of Care: [place] Prior Approval Number:    Date Approved/Denied:   PASRR Number: [id number]  Discharge Plan: SNF    Current Diagnoses: Patient Active Problem List   Diagnosis Date Noted  . Acute lower UTI 01/16/2017  . Endometrial hyperplasia 01/16/2017  . Lytic bone lesion of left femur 01/15/2017  . Hip fracture, pathological ([place]) 01/15/2017  . Lymphedema 09/17/2015  . Tresa Jolley term current use of anticoagulant therapy 08/23/2015  . DVT, lower extremity ([place]) 06/18/2015  . Bilateral knee pain 04/03/2015  . Arm edema 08/28/2014  . Vitamin D deficiency 04/23/2014  . Hyperparathyroidism ([place]) 04/23/2014  . Depression 07/18/2013  . Malignant neoplasm of upper-outer quadrant of right breast in female, estrogen receptor positive ([place]) 07/15/2013  . Pre-op examination 07/05/2013  . Overactive bladder 01/30/2013  . Primary hypercoagulable state ([place]) 12/19/2012  . HTN (hypertension) 09/27/2011  . Hearing loss 09/27/2011  . Seasonal allergies 09/27/2011  . Shortness of breath 08/26/2011  . Hypothyroid 08/26/2011    Orientation RESPIRATION BLADDER Height & Weight     Self, Time, Situation, Place  O2 Indwelling catheter Weight: 205 lb (93 kg) Height:  5\' 4"  (162.6 cm)  BEHAVIORAL SYMPTOMS/MOOD NEUROLOGICAL BOWEL NUTRITION STATUS      Continent Diet  AMBULATORY STATUS COMMUNICATION OF NEEDS Skin   Extensive Assist Verbally Other (Comment)  ( Incision(Closed)08/20/18HipLeft)                       Personal Care Assistance Level of Assistance  Bathing, Feeding, Dressing Bathing Assistance: Limited assistance Feeding assistance: Independent Dressing Assistance: Limited assistance     Functional Limitations Info             SPECIAL CARE FACTORS FREQUENCY  PT (By licensed PT), OT (By licensed OT)     PT Frequency: 5x OT Frequency: 5x            Contractures Contractures Info: Not present    Additional Factors Info  Code Status, Allergies Code Status Info: Full Code Allergies Info: Benadryl Diphenhydramine Hcl;Carbocaine Mepivacaine Hcl;Codeine;Epinephrine;Sulfa Antibiotics;Tramadol;Penicillins;Anesthetics, Amide           Current Medications (01/17/2017):  This is the current hospital active medication list Current Facility-Administered Medications  Medication Dose Route Frequency Provider Last Rate Last Dose  . 0.9 %  sodium chloride infusion   Intravenous Continuous Regalado, Belkys A, MD      . bisacodyl (DULCOLAX) suppository 10 mg  10 mg Rectal Daily PRN Danae Orleans, PA-C      . cefTRIAXone (ROCEPHIN) 1 g in dextrose 5 % 50 mL IVPB  1 g Intravenous QHS Norval Morton, MD   Stopped at 01/16/17 2237  . cholecalciferol (VITAMIN D) tablet 5,000 Units  5,000 Units Oral Daily Regalado, Belkys A, MD   5,000 Units at 01/17/17 1258  . docusate sodium (COLACE) capsule 100 mg  100 mg Oral BID Danae Orleans, PA-C   100 mg at 01/17/17 0840  . fentaNYL (  SUBLIMAZE) injection 25 mcg  25 mcg Intravenous Q2H PRN Norval Morton, MD   25 mcg at 01/16/17 2119  . ferrous sulfate tablet 325 mg  325 mg Oral TID PC Danae Orleans, PA-C   325 mg at 01/17/17 1258  . HYDROcodone-acetaminophen (NORCO/VICODIN) 5-325 MG per tablet 1-2 tablet  1-2 tablet Oral Q6H PRN Danae Orleans, PA-C   2 tablet at 01/17/17 0841  . HYDROmorphone (DILAUDID) injection 0.5-1 mg  0.5-1 mg Intravenous Q2H PRN Babish, Matthew, PA-C       . levothyroxine (SYNTHROID, LEVOTHROID) tablet 125 mcg  125 mcg Oral QAC breakfast Fuller Plan A, MD   125 mcg at 01/17/17 0840  . magnesium citrate solution 1 Bottle  1 Bottle Oral Once PRN Babish, Matthew, PA-C      . menthol-cetylpyridinium (CEPACOL) lozenge 3 mg  1 lozenge Oral PRN Danae Orleans, PA-C       Or  . phenol (CHLORASEPTIC) mouth spray 1 spray  1 spray Mouth/Throat PRN Danae Orleans, PA-C      . methocarbamol (ROBAXIN) tablet 500 mg  500 mg Oral Q6H PRN Fuller Plan A, MD   500 mg at 01/16/17 0220   Or  . methocarbamol (ROBAXIN) 500 mg in dextrose 5 % 50 mL IVPB  500 mg Intravenous Q6H PRN Norval Morton, MD   Stopped at 01/16/17 1932  . metoCLOPramide (REGLAN) tablet 5-10 mg  5-10 mg Oral Q8H PRN Danae Orleans, PA-C       Or  . metoCLOPramide (REGLAN) injection 5-10 mg  5-10 mg Intravenous Q8H PRN Babish, Matthew, PA-C      . nitroGLYCERIN (NITROSTAT) SL tablet 0.4 mg  0.4 mg Sublingual Q5 min PRN Regalado, Belkys A, MD      . ondansetron (ZOFRAN) tablet 4 mg  4 mg Oral Q6H PRN Danae Orleans, PA-C       Or  . ondansetron (ZOFRAN) injection 4 mg  4 mg Intravenous Q6H PRN Babish, Matthew, PA-C      . polyethylene glycol (MIRALAX / GLYCOLAX) packet 17 g  17 g Oral Daily PRN Danae Orleans, PA-C         Discharge Medications: Please see discharge summary for a list of discharge medications.  Relevant Imaging Results:  Relevant Lab Results:   Additional Information SSN [id number]  Marymargaret Kirker L Jarely Juncaj, LCSW

## 2017-01-18 DIAGNOSIS — E213 Hyperparathyroidism, unspecified: Secondary | ICD-10-CM

## 2017-01-18 DIAGNOSIS — D509 Iron deficiency anemia, unspecified: Secondary | ICD-10-CM

## 2017-01-18 DIAGNOSIS — Z96642 Presence of left artificial hip joint: Secondary | ICD-10-CM

## 2017-01-18 LAB — BASIC METABOLIC PANEL
ANION GAP: 5 (ref 5–15)
BUN: 18 mg/dL (ref 6–20)
CALCIUM: 9.3 mg/dL (ref 8.9–10.3)
CO2: 25 mmol/L (ref 22–32)
Chloride: 108 mmol/L (ref 101–111)
Creatinine, Ser: 0.65 mg/dL (ref 0.44–1.00)
GFR calc non Af Amer: >60 mL/min (ref >60)
Glucose, Bld: 119 mg/dL — ABNORMAL HIGH (ref 65–99)
Potassium: 4.4 mmol/L (ref 3.5–5.1)
SODIUM: 138 mmol/L (ref 135–145)

## 2017-01-18 LAB — CBC
HEMATOCRIT: 26.3 % — AB (ref 36.0–46.0)
HEMOGLOBIN: 9 g/dL — AB (ref 12.0–15.0)
MCH: 31.5 pg (ref 26.0–34.0)
MCHC: 34.2 g/dL (ref 30.0–36.0)
MCV: 92 fL (ref 78.0–100.0)
Platelets: 145 10*3/uL — ABNORMAL LOW (ref 150–400)
RBC: 2.86 MIL/uL — ABNORMAL LOW (ref 3.87–5.11)
RDW: 14.9 % (ref 11.5–15.5)
WBC: 11.3 10*3/uL — AB (ref 4.0–10.5)

## 2017-01-18 LAB — URINE CULTURE

## 2017-01-18 LAB — PROTIME-INR
INR: 1.32
PROTHROMBIN TIME: 16.5 s — AB (ref 11.4–15.2)

## 2017-01-18 MED ORDER — WARFARIN SODIUM 5 MG PO TABS
7.5000 mg | ORAL_TABLET | Freq: Once | ORAL | Status: AC
  Administered 2017-01-18: 7.5 mg via ORAL
  Filled 2017-01-18: qty 1

## 2017-01-18 MED ORDER — WARFARIN - PHARMACIST DOSING INPATIENT
Freq: Every day | Status: DC
  Administered 2017-01-20 – 2017-01-21 (×2)

## 2017-01-18 MED ORDER — SODIUM CHLORIDE 0.9 % IV SOLN
125.0000 mg | Freq: Once | INTRAVENOUS | Status: AC
  Administered 2017-01-18: 125 mg via INTRAVENOUS
  Filled 2017-01-18: qty 10

## 2017-01-18 MED ORDER — HYDROCODONE-ACETAMINOPHEN 5-325 MG PO TABS
1.0000 | ORAL_TABLET | ORAL | Status: DC | PRN
  Administered 2017-01-18 – 2017-01-21 (×14): 1 via ORAL
  Administered 2017-01-21: 2 via ORAL
  Administered 2017-01-21: 1 via ORAL
  Filled 2017-01-18 (×3): qty 1
  Filled 2017-01-18: qty 2
  Filled 2017-01-18 (×2): qty 1
  Filled 2017-01-18: qty 2
  Filled 2017-01-18 (×8): qty 1

## 2017-01-18 NOTE — Progress Notes (Signed)
PROGRESS NOTE [facility]   Alicia Vasquez   WJX:[id number] DOB: Nov 02, 1940  DOA: 01/15/2017 PCP: Briscoe Deutscher, DO   Brief Narrative:  Alicia Vasquez is a 76 year old female with medical history significant of hypertension, DVT on chronic anticoagulation, hyperparathyroidism and breast cancer status post partial mastectomy and radiation who presented with complaint of worsening left hip pain. She was seen by [facility] 12 days PTA and had a MRI of the left hip. Review of report showed a large lesion of the left femoral neck, intertrochanteric region, greater/lesser trochanters, a high-grade partial tear of the left hamstring tendon, and abnormally thickened endometrium. However 2 days prior to admission patient report increased in hip pain to the point that she was unable to ambulate upon ED evaluation CT scan show pathologic fracture along with the findings of previous MRI. She was also found to have INR subtherapeutic. Prior to admission she was started on ciprofloxacin for suspected UTI. Patient underwent on total left hip arthroplasty. Postop was complicated by anemia for which she was transfused 1 pack of PRBCs.   Subjective: Patient seen and examined she reported feeling better, pain is well controlled. Reports to have some weakness still. No acute events overnight. Patient participating with physical therapy.  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 Awaiting biopsy results Patient to be discharged to SNF in the next 24-48 hrs.  Chest pain, dyspnea Troponin negative, EKG negative, cardiology evaluated patient and reported that this is chronic no further cardiac evaluation at this time. Nitroglycerin when necessary  History of breast cancer Follow-up as an outpatient with Dr.  Jana Hakim  UTI On ceftriaxone, urine culture shows multiple species although patient was on Cipro prior to admission Will complete 3 days of antibiotic course ceftriaxone day 2/3  Acute blood loss anemia Postop She was transfused 1 unit of PRBC She was given IV Venofer today If hemoglobin remains stable possible discharge in a.m.  Hyperparathyroidism:Followed by Dr. Layla Maw.For this and last seen on 12/06/2016. At that time patient had PTH of 26 and a calcium 10.6.Patient being treated with high-dose vitamin D. resume  vitamin D  Hypercalcemia:Chronic. Mild elevation in calcium to 10.9 on admission. Suspect this could be secondary history of hyperparathyroidism vs. metastic disease.  Stable.   Endometrium hyperplasia:Acute. Seen on MRI report. Question possible source of cancer cancer.  transvaginal ultrasound with endometrial thickening. Follow biopsy results from femur and follow Dr Jana Hakim recommendation.   Hypothyroidism: Patient's last TSH was 1.31 on 12/05/16. Continue levothyroxine  Essential hypertension BP stable Resume home medications in a.m.   DVT prophylaxis: Warfarin Code Status: Full code Family Communication: family member at bedside Disposition Plan: SNF in the next 24-48 hours  Consultants:   Orthopedic surgery  Procedures:   Left hip arthroplasty  Antimicrobials: Anti-infectives    Start     Dose/Rate Route Frequency Ordered Stop   01/16/17 2300  ceFAZolin (ANCEF) IVPB 2g/100 mL premix     2 g 200 mL/hr over 30 Minutes Intravenous Every 6 hours 01/16/17 2047 01/17/17 0459   01/16/17 1532  ceFAZolin (ANCEF) 2-4 GM/100ML-% IVPB    Comments:  Bridget Hartshorn   : cabinet override      01/16/17 1532 01/16/17 1712   01/16/17 1000  ceFAZolin (ANCEF) IVPB 2g/100 mL premix     2 g 200 mL/hr over 30  Minutes Intravenous On call to O.R. 01/16/17 3235 01/16/17 1712   01/16/17 0100  cefTRIAXone (ROCEPHIN) 1 g in dextrose 5 % 50 mL IVPB     1  g 100 mL/hr over 30 Minutes Intravenous Daily at bedtime 01/16/17 0018          Objective: Vitals:   01/17/17 1727 01/17/17 2042 01/18/17 0540 01/18/17 1406  BP: (!) 123/48 (!) 118/46 (!) 130/54 (!) 114/49  Pulse: 93 (!) 108 (!) 101 86  Resp: 16 16 18 18   Temp: 98 F (36.7 C) 99.1 F (37.3 C) 98.5 F (36.9 C) 98.2 F (36.8 C)  TempSrc: Oral Oral Oral Oral  SpO2: 100% 94% 94% 97%  Weight:      Height:        Intake/Output Summary (Last 24 hours) at 01/18/17 1657 Last data filed at 01/18/17 1229  Gross per 24 hour  Intake          1343.75 ml  Output             1750 ml  Net          -406.25 ml   Filed Weights   01/15/17 2107  Weight: 93 kg (205 lb)    Examination:  General exam: Appears calm and comfortable  HEENT: OP moist and clear Respiratory system: Clear to auscultation. No wheezes,crackle or rhonchi Cardiovascular system: S1 & S2 heard, RRR. No JVD, murmurs, rubs or gallops Gastrointestinal system: Abdomen is nondistended, soft and nontender.  Central nervous system: Alert and oriented. No focal neurological deficits. Extremities: No pedal edema. Right lower extremity, dressing clean and intact, decreased range of motion of movement on RLE Skin: No rashes, lesions or ulcers Psychiatry: Judgement and insight appear normal. Mood & affect appropriate.    Data Reviewed: I have personally reviewed following labs and imaging studies  CBC:  Recent Labs Lab 01/12/17 1423 01/15/17 2000 01/16/17 0132 01/16/17 1846 01/17/17 0259 01/18/17 0410  WBC 6.1 8.1 6.9  --  10.2 11.3*  NEUTROABS 3.7 5.0  --   --   --   --   HGB 13.6 13.4 12.9 10.9* 8.8* 9.0*  HCT 41.1 41.0 38.8 32.0* 26.4* 26.3*  MCV 94.9 93.4 93.5  --  94.6 92.0  PLT 218 222 196  --  187 573*   Basic Metabolic Panel:  Recent Labs Lab 01/15/17 2000 01/16/17 0132 01/16/17 1846 01/17/17 0259 01/18/17 0410  NA 140 142 142 140 138  K 5.1 3.9 3.9 4.2 4.4  CL 108 109  --  110 108  CO2 26 27  --   23 25  GLUCOSE 103* 107* 120* 179* 119*  BUN 19 16  --  14 18  CREATININE 0.75 0.78  --  0.73 0.65  CALCIUM 10.9* 10.3  --  9.4 9.3   GFR: Estimated Creatinine Clearance: 65.1 mL/min (by C-G formula based on SCr of 0.65 mg/dL). Liver Function Tests: No results for input(s): AST, ALT, ALKPHOS, BILITOT, PROT, ALBUMIN in the last 168 hours. No results for input(s): LIPASE, AMYLASE in the last 168 hours. No results for input(s): AMMONIA in the last 168 hours. Coagulation Profile:  Recent Labs Lab 01/12/17 1423 01/15/17 2000 01/16/17 0956 01/17/17 1111 01/18/17 0410  INR 1.20* 1.62 1.53 1.62 1.32  PROTIME 14.4*  --   --   --   --    Cardiac Enzymes:  Recent Labs Lab 01/16/17 0132 01/16/17 1101 01/16/17 2106 01/17/17 0259  TROPONINI <0.03 <0.03 <0.03 <0.03  BNP (last 3 results) No results for input(s): PROBNP in the last 8760 hours. HbA1C: No results for input(s): HGBA1C in the last 72 hours. CBG: No results for input(s): GLUCAP in the last 168 hours. Lipid Profile:  Recent Labs  01/17/17 0259  CHOL 125  HDL 32*  LDLCALC 80  TRIG 65  CHOLHDL 3.9   Thyroid Function Tests: No results for input(s): TSH, T4TOTAL, FREET4, T3FREE, THYROIDAB in the last 72 hours. Anemia Panel: No results for input(s): VITAMINB12, FOLATE, FERRITIN, TIBC, IRON, RETICCTPCT in the last 72 hours. Sepsis Labs: No results for input(s): PROCALCITON, LATICACIDVEN in the last 168 hours.  Recent Results (from the past 240 hour(s))  Urine culture     Status: Abnormal   Collection Time: 01/15/17  8:33 PM  Result Value Ref Range Status   Specimen Description URINE, RANDOM  Final   Special Requests NONE  Final   Culture MULTIPLE SPECIES PRESENT, SUGGEST RECOLLECTION (A)  Final   Report Status 01/18/2017 FINAL  Final  Surgical pcr screen     Status: None   Collection Time: 01/16/17  2:00 PM  Result Value Ref Range Status   MRSA, PCR NEGATIVE NEGATIVE Final   Staphylococcus aureus NEGATIVE  NEGATIVE Final    Comment:        The Xpert SA Assay (FDA approved for NASAL specimens in patients over 71 years of age), is one component of a comprehensive surveillance program.  Test performance has been validated by [facility] for patients greater than or equal to 77 year old. It is not intended to diagnose infection nor to guide or monitor treatment.       Radiology Studies: US Transvaginal Non-ob  Result Date: 01/17/2017 CLINICAL DATA:  Abnormal CT EXAM: TRANSABDOMINAL AND TRANSVAGINAL ULTRASOUND OF PELVIS TECHNIQUE: Both transabdominal and transvaginal ultrasound examinations of the pelvis were performed. Transabdominal technique was performed for global imaging of the pelvis including uterus, ovaries, adnexal regions, and pelvic cul-de-sac. It was necessary to proceed with endovaginal exam following the transabdominal exam to visualize the uterus, endometrium, ovaries and adnexa . COMPARISON:  CT 01/15/2017 FINDINGS: Uterus Measurements: 6.2 x 3.3 x 4.6 cm. No fibroids or other mass visualized. Endometrium Thickness: Thickened, measuring 15 mm, heterogeneous appearance. Right ovary Measurements: 1.6 x 1.1 x 1.2 cm. Normal appearance/no adnexal mass. Left ovary Measurements: 2.6 x 1.7 x 1.8 cm. 1.5 x 1.2 x 1.1 cm simple appearing cyst noted in the left ovary. Other findings No abnormal free fluid. IMPRESSION: Endometrial thickness is considered abnormal for an asymptomatic post-menopausal female. Endometrial sampling should be considered to exclude carcinoma. 1.5 cm simple appearing cyst in the left ovary, likely benign. This could be followed with repeat ultrasound in 1 year. Electronically Signed   By: Rolm Baptise M.D.   On: 01/17/2017 08:48   US Pelvis Complete  Result Date: 01/17/2017 CLINICAL DATA:  Abnormal CT EXAM: TRANSABDOMINAL AND TRANSVAGINAL ULTRASOUND OF PELVIS TECHNIQUE: Both transabdominal and transvaginal ultrasound examinations of the pelvis were performed.  Transabdominal technique was performed for global imaging of the pelvis including uterus, ovaries, adnexal regions, and pelvic cul-de-sac. It was necessary to proceed with endovaginal exam following the transabdominal exam to visualize the uterus, endometrium, ovaries and adnexa . COMPARISON:  CT 01/15/2017 FINDINGS: Uterus Measurements: 6.2 x 3.3 x 4.6 cm. No fibroids or other mass visualized. Endometrium Thickness: Thickened, measuring 15 mm, heterogeneous appearance. Right ovary Measurements: 1.6 x 1.1 x 1.2 cm. Normal appearance/no adnexal mass. Left ovary Measurements: 2.6 x 1.7 x  1.8 cm. 1.5 x 1.2 x 1.1 cm simple appearing cyst noted in the left ovary. Other findings No abnormal free fluid. IMPRESSION: Endometrial thickness is considered abnormal for an asymptomatic post-menopausal female. Endometrial sampling should be considered to exclude carcinoma. 1.5 cm simple appearing cyst in the left ovary, likely benign. This could be followed with repeat ultrasound in 1 year. Electronically Signed   By: Rolm Baptise M.D.   On: 01/17/2017 08:48   Dg Hip Port Unilat With Pelvis 1v Left  Result Date: 01/16/2017 CLINICAL DATA:  Status post left hip replacement. EXAM: DG HIP (WITH OR WITHOUT PELVIS) 1V PORT LEFT COMPARISON:  None. FINDINGS: The patient is status post left hip replacement. Acetabular and femoral components are in good position. Postoperative air seen in the soft tissues. IMPRESSION: Left hip replacement as above. Electronically Signed   By: Dorise Bullion III M.D   On: 01/16/2017 19:52      Scheduled Meds: . cholecalciferol  5,000 Units Oral Daily  . docusate sodium  100 mg Oral BID  . ferrous sulfate  325 mg Oral TID PC  . levothyroxine  125 mcg Oral QAC breakfast  . warfarin  7.5 mg Oral ONCE-1800  . Warfarin - Pharmacist Dosing Inpatient   Does not apply q1800   Continuous Infusions: . cefTRIAXone (ROCEPHIN)  IV Stopped (01/17/17 2330)  . methocarbamol (ROBAXIN)  IV Stopped  (01/16/17 1932)     LOS: 3 days    Time spent: Total of 25 minutes spent with pt, greater than 50% of which was spent in discussion of  treatment, counseling and coordination of care    Chipper Oman, MD Pager: Text Page via [email or web address]   If 7PM-7AM, please contact night-coverage [email or web address] 01/18/2017, 4:57 PM

## 2017-01-18 NOTE — Progress Notes (Signed)
CSW following to assist with SNF placement. Pt has selected [facility]. CSW spoke with admissions- [facility] will have bed open up Friday 8/24. Informed pt who states her second choice if ready to DC prior to 8/24 would be [facility].  Will continue following.  Sharren Bridge, MSW, LCSW Clinical Social Work 01/18/2017 [phone]

## 2017-01-18 NOTE — Progress Notes (Signed)
ANTICOAGULATION CONSULT NOTE - Follow Up Consult  Pharmacy Consult for warfarin Indication: h/o DVT  Allergies  Allergen Reactions  . Anesthetics, Amide Hypertension  . Benadryl [Diphenhydramine Hcl] Other (See Comments)    Dizziness  . Carbocaine [Mepivacaine Hcl] Hypertension  . Codeine Other (See Comments)    Dizziness  . Epinephrine Hypertension  . Sulfa Antibiotics Other (See Comments)    dizziness  . Tramadol     Sedation.   Marland Kitchen Penicillins Rash    Patient Measurements: Height: 5\' 4"  (162.6 cm) Weight: 205 lb (93 kg) IBW/kg (Calculated) : 54.7  Vital Signs: Temp: 98.5 F (36.9 C) (08/22 0540) Temp Source: Oral (08/22 0540) BP: 130/54 (08/22 0540) Pulse Rate: 101 (08/22 0540)  Labs:  Recent Labs  01/15/17 2000  01/16/17 0132 01/16/17 0956 01/16/17 1101 01/16/17 1846 01/16/17 2106 01/17/17 0259 01/17/17 1111 01/18/17 0410  HGB 13.4  --  12.9  --   --  10.9*  --  8.8*  --  9.0*  HCT 41.0  --  38.8  --   --  32.0*  --  26.4*  --  26.3*  PLT 222  --  196  --   --   --   --  187  --  145*  APTT 48*  --   --   --   --   --   --   --   --   --   LABPROT 19.4*  --   --  18.6*  --   --   --   --  19.4* 16.5*  INR 1.62  --   --  1.53  --   --   --   --  1.62 1.32  CREATININE 0.75  --  0.78  --   --   --   --  0.73  --  0.65  TROPONINI  --   < > <0.03  --  <0.03  --  <0.03 <0.03  --   --   < > = values in this interval not displayed.  Estimated Creatinine Clearance: 65.1 mL/min (by C-G formula based on SCr of 0.65 mg/dL).   Medical History: Past Medical History:  Diagnosis Date  . Allergy   . Anxiety   . Arthritis   . Blood transfusion without reported diagnosis   . Breast cancer ([place]) 07/12/2013   Invasive Mammary Carcinoma  . DVT (deep vein thrombosis) in pregnancy ([place])   . Hypertension   . Hypothyroid   . Personal history of radiation therapy   . Pneumonia   . Radiation 11/21/13-01/07/14   Right Breast/Supraclavicular     Assessment: 18 yoF on  chronic warfarin therapy for history of DVT in 2014, history of breast cancer admitted with hip fracture. Warfarin was held (last dose 8/18) and patient started on Lovenox bridge on admission.  Patient underwent left THA 8/20 afternoon.  Last dose of Lovenox 1 mg/kg given early AM of 8/20 and INR 1.53 the morning of surgery.  Due to ABLA during procedure, postop anticoagulation is to be delayed until 8/22 with warfarin.  TRH consulted pharmacy to manage.   PTA warfarin regimen reported as 7.5 mg daily except 10 mg on Tues/Thurs/Saturdays.  INR was subtherapeutic on admission at 1.62.  Today, 01/18/2017: - POD#2. Per ortho instruction, hold off on resuming anticoagulation until 8/22 (POD#2).  Will resume this evening. - INR 1.32 - Hgb 9.0 - decreased due to expected blood loss following surgery, improved slightly following 1 unit PRBC  transfusion yesterday, platelets decreased to 145K - TRH discussed Lovenox bridge with Ortho who recommended not starting Lovenox  Goal of Therapy:  INR 2-3   Plan:  Warfarin 7.5 mg today at 1800. Daily INR.  Hershal Coria 01/18/2017,10:47 AM

## 2017-01-18 NOTE — Progress Notes (Signed)
Physical Therapy Treatment Patient Details Name: Alicia Vasquez MRN: [id number] DOB: 09/27/1940 Today's Date: 01/18/2017    History of Present Illness Pt admitted s/p L femoral neck pathological fx with posterior THR 01/16/17.  Pt with hx of breast CA    PT Comments    Pt progressed to standing at EOB this am but unable to advance either LE.  Pt ltd by c/o dizziness and pain.   Follow Up Recommendations  SNF     Equipment Recommendations  None recommended by PT    Recommendations for Other Services OT consult     Precautions / Restrictions Precautions Precautions: Posterior Hip;Fall Precaution Booklet Issued: Yes (comment) Precaution Comments: Reviewed x 2 and sign posted in room Restrictions Weight Bearing Restrictions: Yes LLE Weight Bearing: Partial weight bearing LLE Partial Weight Bearing Percentage or Pounds: 50    Mobility  Bed Mobility Overal bed mobility: Needs Assistance Bed Mobility: Supine to Sit;Sit to Supine     Supine to sit: Max assist;+2 for physical assistance;+2 for safety/equipment Sit to supine: Max assist;+2 for physical assistance;+2 for safety/equipment   General bed mobility comments: Cues for sequence and use of R LE to self assist.   Pt assisted to/from EOB with use of pad  Transfers Overall transfer level: Needs assistance Equipment used: Rolling walker (2 wheeled) Transfers: Sit to/from Stand Sit to Stand: Mod assist;+2 physical assistance;+2 safety/equipment;From elevated surface         General transfer comment: Cues for transition position and use of UEs to self assist.  Ambulation/Gait             General Gait Details: Pt stood only but unable to advance either foot.  Pt ltd by c/o dizziness, pain and "not feeling good"   Stairs            Wheelchair Mobility    Modified Rankin (Stroke Patients Only)       Balance                                            Cognition Arousal/Alertness:  Awake/alert Behavior During Therapy: WFL for tasks assessed/performed Overall Cognitive Status: Within Functional Limits for tasks assessed                                        Exercises Total Joint Exercises Ankle Circles/Pumps: AROM;Both;15 reps;Supine    General Comments        Pertinent Vitals/Pain Pain Assessment: Faces Faces Pain Scale: Hurts little more Pain Location: L hip, groin and ankle Pain Descriptors / Indicators: Aching;Sore Pain Intervention(s): Limited activity within patient's tolerance;Monitored during session;Premedicated before session;Ice applied    Home Living                      Prior Function            PT Goals (current goals can now be found in the care plan section) Acute Rehab PT Goals Patient Stated Goal: Regain IND PT Goal Formulation: With patient Time For Goal Achievement: 01/28/17 Potential to Achieve Goals: Good Progress towards PT goals: Progressing toward goals    Frequency    Min 4X/week      PT Plan Current plan remains appropriate    Co-evaluation PT/OT/SLP Co-Evaluation/Treatment: Yes Reason for  Co-Treatment: For patient/therapist safety PT goals addressed during session: Mobility/safety with mobility OT goals addressed during session: ADL's and self-care      AM-PAC PT "6 Clicks" Daily Activity  Outcome Measure  Difficulty turning over in bed (including adjusting bedclothes, sheets and blankets)?: Unable Difficulty moving from lying on back to sitting on the side of the bed? : Unable Difficulty sitting down on and standing up from a chair with arms (e.g., wheelchair, bedside commode, etc,.)?: Unable Help needed moving to and from a bed to chair (including a wheelchair)?: A Lot Help needed walking in hospital room?: Total Help needed climbing 3-5 steps with a railing? : Total 6 Click Score: 7    End of Session Equipment Utilized During Treatment: Gait belt Activity Tolerance: Patient  limited by fatigue;Patient limited by pain;Other (comment) Patient left: in bed;with call bell/phone within reach Nurse Communication: Mobility status PT Visit Diagnosis: Pain;Dizziness and giddiness (R42);Muscle weakness (generalized) (M62.81) Pain - Right/Left: Left Pain - part of body: Hip;Leg     Time: 5449-2010 PT Time Calculation (min) (ACUTE ONLY): 43 min  Charges:  $Therapeutic Activity: 23-37 mins                    G Codes:       Pg [phone]    Vira Chaplin 01/18/2017, 12:30 PM

## 2017-01-18 NOTE — Evaluation (Signed)
Occupational Therapy Evaluation Patient Details Name: Alicia Vasquez MRN: [id number] DOB: 21-Jan-1941 Today's Date: 01/18/2017    History of Present Illness Pt admitted s/p L femoral neck pathological fx with posterior THR 01/16/17.  Pt with hx of breast CA   Clinical Impression   This 76 year old female was admitted for the above. She was independent with adls prior to admission. At this time she needs up to total +2 assistance for LB dressing. She was able to stand today with mod +2 assistance from a high bed.  Pt will benefit from continued OT in acute setting as well as follow up OT at SNF to increase independence with adls. Goals in acute are for mod A (+2 for transfers)    Follow Up Recommendations  SNF    Equipment Recommendations  3 in 1 bedside commode    Recommendations for Other Services       Precautions / Restrictions Precautions Precautions: Posterior Hip;Fall Precaution Booklet Issued: Yes (comment) Precaution Comments: Reviewed x 2 and sign posted in room Restrictions Weight Bearing Restrictions: Yes LLE Weight Bearing: Partial weight bearing LLE Partial Weight Bearing Percentage or Pounds: 50      Mobility Bed Mobility Overal bed mobility: Needs Assistance Bed Mobility: Supine to Sit;Sit to Supine     Supine to sit: Max assist;+2 for physical assistance;+2 for safety/equipment Sit to supine: Max assist;+2 for physical assistance;+2 for safety/equipment   General bed mobility comments: Cues for sequence and use of R LE to self assist.   Pt assisted to/from EOB with use of pad  Transfers Overall transfer level: Needs assistance Equipment used: Rolling walker (2 wheeled) Transfers: Sit to/from Stand Sit to Stand: Mod assist;+2 physical assistance;+2 safety/equipment;From elevated surface         General transfer comment: Cues for transition position and use of UEs to self assist.    Balance                                            ADL either performed or assessed with clinical judgement   ADL Overall ADL's : Needs assistance/impaired Eating/Feeding: Independent   Grooming: Min guard;Sitting   Upper Body Bathing: Set up;Sitting   Lower Body Bathing: Maximal assistance;Sit to/from stand;+2 for physical assistance   Upper Body Dressing : Minimal assistance;Sitting   Lower Body Dressing: Total assistance;+2 for physical assistance;Sit to/from stand                 General ADL Comments: able to stand today but unable to pivot to chair. Pt felt hot a couple of times:  gave her a very cold washcloth and turned temp way down.  BP with HOB raised was 115/41 and sitting EOB was 140/47.  Pt c/o lightheadedness. Educated on use of AE for ADLs/precautions, but pt did not use this session     Vision         Perception     Praxis      Pertinent Vitals/Pain Pain Assessment: Faces Faces Pain Scale: Hurts little more Pain Location: L hip, groin and ankle Pain Descriptors / Indicators: Aching;Sore Pain Intervention(s): Limited activity within patient's tolerance;Monitored during session;Premedicated before session;Ice applied     Hand Dominance     Extremity/Trunk Assessment Upper Extremity Assessment Upper Extremity Assessment:  (able to lift bil to 90)  Communication Communication Communication: HOH   Cognition Arousal/Alertness: Awake/alert Behavior During Therapy: WFL for tasks assessed/performed Overall Cognitive Status: Within Functional Limits for tasks assessed                                     General Comments       Exercises    Shoulder Instructions      Home Living Family/patient expects to be discharged to:: Skilled nursing facility                                 Additional Comments: Pt lives with elderly friend      Prior Functioning/Environment Level of Independence: Independent with assistive device(s)        Comments: Pt  ambulating with RW prior to surgery but struggling         OT Problem List: Decreased strength;Decreased activity tolerance;Impaired balance (sitting and/or standing);Decreased knowledge of use of DME or AE;Decreased knowledge of precautions;Pain      OT Treatment/Interventions: Self-care/ADL training;DME and/or AE instruction;Patient/family education;Balance training;Energy conservation    OT Goals(Current goals can be found in the care plan section) Acute Rehab OT Goals Patient Stated Goal: Regain IND OT Goal Formulation: With patient Time For Goal Achievement: 01/25/17 Potential to Achieve Goals: Good ADL Goals Pt Will Perform Lower Body Bathing: sit to/from stand;with adaptive equipment;with mod assist Pt Will Transfer to Toilet: with mod assist;with +2 assist;bedside commode;stand pivot transfer Additional ADL Goal #1: pt will verbalize 3/3 posterior THPS Additional ADL Goal #2: Pt will perform bed mobility with mod A in preparation for adls  OT Frequency: Min 2X/week   Barriers to D/C:            Co-evaluation   Reason for Co-Treatment: For patient/therapist safety PT goals addressed during session: Mobility/safety with mobility OT goals addressed during session: ADL's and self-care      AM-PAC PT "6 Clicks" Daily Activity     Outcome Measure Help from another person eating meals?: None Help from another person taking care of personal grooming?: A Little Help from another person toileting, which includes using toliet, bedpan, or urinal?: A Lot Help from another person bathing (including washing, rinsing, drying)?: A Lot Help from another person to put on and taking off regular upper body clothing?: A Little Help from another person to put on and taking off regular lower body clothing?: Total 6 Click Score: 15   End of Session    Activity Tolerance: Patient tolerated treatment well Patient left: in bed;with call bell/phone within reach;with bed alarm set  OT  Visit Diagnosis: Pain Pain - Right/Left: Left Pain - part of body: Hip                Time: 7619-5093 OT Time Calculation (min): 43 min Charges:  OT General Charges $OT Visit: 1 Procedure OT Evaluation $OT Eval Low Complexity: 1 Procedure G-Codes:     Taylyn Brame, OTR/L [phone] 01/18/2017  Aceson Labell 01/18/2017, 12:39 PM

## 2017-01-18 NOTE — Progress Notes (Signed)
     Subjective: 2 Days Post-Op Procedure(s) (LRB): TOTAL HIP ARTHROPLASTY POSTERIOR (Left)   Patient reports pain as moderate, pain not fully controlled. Discussed increasing the frequency of her meds, and she felt that this might help some.  No reported events throughout the night.  She states that she was doing well until she was rolled up on her left side and she has had slightly increased pain since that time. Discussed the surgery and that we sent the tissue to pathology for evaluation.  She knows that future plans will be determined upon those findings.    Objective:   VITALS:   Vitals:   01/17/17 2042 01/18/17 0540  BP: (!) 118/46 (!) 130/54  Pulse: (!) 108 (!) 101  Resp: 16 18  Temp: 99.1 F (37.3 C) 98.5 F (36.9 C)  SpO2: 94% 94%    Dorsiflexion/Plantar flexion intact Incision: dressing C/D/I No cellulitis present Compartment soft  LABS  Recent Labs  01/16/17 0132 01/16/17 1846 01/17/17 0259 01/18/17 0410  HGB 12.9 10.9* 8.8* 9.0*  HCT 38.8 32.0* 26.4* 26.3*  WBC 6.9  --  10.2 11.3*  PLT 196  --  187 145*     Recent Labs  01/16/17 0132 01/16/17 1846 01/17/17 0259 01/18/17 0410  NA 142 142 140 138  K 3.9 3.9 4.2 4.4  BUN 16  --  14 18  CREATININE 0.78  --  0.73 0.65  GLUCOSE 107* 120* 179* 119*     Assessment/Plan: 2 Days Post-Op Procedure(s) (LRB): TOTAL HIP ARTHROPLASTY POSTERIOR (Left) Up with therapy  Continue with posterior hip precautions Continue with PWB   [place]. Gerasimos Plotts   PAC  01/18/2017, 8:25 AM

## 2017-01-19 ENCOUNTER — Ambulatory Visit: Payer: Medicare Other | Admitting: Oncology

## 2017-01-19 ENCOUNTER — Encounter: Payer: Medicare Other | Admitting: Physical Therapy

## 2017-01-19 ENCOUNTER — Inpatient Hospital Stay (HOSPITAL_COMMUNITY): Payer: Medicare Other

## 2017-01-19 LAB — PROTIME-INR
INR: 1.21
Prothrombin Time: 15.3 seconds — ABNORMAL HIGH (ref 11.4–15.2)

## 2017-01-19 LAB — CBC
HEMATOCRIT: 22.6 % — AB (ref 36.0–46.0)
HEMOGLOBIN: 7.6 g/dL — AB (ref 12.0–15.0)
MCH: 31.1 pg (ref 26.0–34.0)
MCHC: 33.6 g/dL (ref 30.0–36.0)
MCV: 92.6 fL (ref 78.0–100.0)
Platelets: 158 10*3/uL (ref 150–400)
RBC: 2.44 MIL/uL — ABNORMAL LOW (ref 3.87–5.11)
RDW: 14.9 % (ref 11.5–15.5)
WBC: 9.2 10*3/uL (ref 4.0–10.5)

## 2017-01-19 LAB — BASIC METABOLIC PANEL
ANION GAP: 6 (ref 5–15)
BUN: 11 mg/dL (ref 6–20)
CHLORIDE: 106 mmol/L (ref 101–111)
CO2: 25 mmol/L (ref 22–32)
CREATININE: 0.52 mg/dL (ref 0.44–1.00)
Calcium: 9.2 mg/dL (ref 8.9–10.3)
GFR calc non Af Amer: >60 mL/min (ref >60)
GLUCOSE: 102 mg/dL — AB (ref 65–99)
Potassium: 4 mmol/L (ref 3.5–5.1)
Sodium: 137 mmol/L (ref 135–145)

## 2017-01-19 LAB — PREPARE RBC (CROSSMATCH)

## 2017-01-19 MED ORDER — SODIUM CHLORIDE 0.9 % IV SOLN
Freq: Once | INTRAVENOUS | Status: AC
  Administered 2017-01-19: 11:00:00 via INTRAVENOUS

## 2017-01-19 MED ORDER — WARFARIN SODIUM 5 MG PO TABS
7.5000 mg | ORAL_TABLET | Freq: Once | ORAL | Status: AC
  Administered 2017-01-19: 7.5 mg via ORAL
  Filled 2017-01-19: qty 1

## 2017-01-19 NOTE — Progress Notes (Signed)
     Subjective: 3 Days Post-Op Procedure(s) (LRB): TOTAL HIP ARTHROPLASTY POSTERIOR (Left)   Patient reports pain as mild, pain controlled. Better pain control than it was yesterday.  No reported events throughout the night.  Objective:   VITALS:   Vitals:   01/19/17 1130 01/19/17 1155  BP: (!) 129/50 (!) 124/58  Pulse: 93 96  Resp: 18 16  Temp: 98 F (36.7 C) 98.5 F (36.9 C)  SpO2: 99% 98%    Dorsiflexion/Plantar flexion intact Incision: dressing C/D/I No cellulitis present Compartment soft  LABS  Recent Labs  01/17/17 0259 01/18/17 0410 01/19/17 0427  HGB 8.8* 9.0* 7.6*  HCT 26.4* 26.3* 22.6*  WBC 10.2 11.3* 9.2  PLT 187 145* 158     Recent Labs  01/17/17 0259 01/18/17 0410 01/19/17 0427  NA 140 138 137  K 4.2 4.4 4.0  BUN 14 18 11   CREATININE 0.73 0.65 0.52  GLUCOSE 179* 119* 102*     Assessment/Plan: 3 Days Post-Op Procedure(s) (LRB): TOTAL HIP ARTHROPLASTY POSTERIOR (Left) Up with therapy Discharge disposition to be determined.  Continue with posterior hip precautions Continue with PWB  Ortho recommendations:  Continue with patient's Coumadin  Norco for pain management (Rx written).  MiraLax and Colace for constipation  Iron 325 mg tid for 2-3 weeks   PWB on the left leg.  Dressing to remain in place until follow in clinic in 2 weeks.  Dressing is waterproof and may shower with it in place.  Follow up in 2 weeks at [facility]. Follow up with OLIN,Micca Matura D in 2 weeks.  Contact information:  [facility] [address] Antaniya Venuti   PAC  01/19/2017, 1:13 PM

## 2017-01-19 NOTE — Progress Notes (Signed)
Courtesy note:  Alicia Vasquez is recovering slowly from her surgery. Surprisingly her path is not out yet. I expect it will be reported today. I expect to meet with her tonight to discuss.  Appreciate your help tot his patient!  GM

## 2017-01-19 NOTE — Progress Notes (Signed)
PROGRESS NOTE [facility]   [name]   CBS:[id number] DOB: Jul 24, 1940  DOA: 01/15/2017 PCP: Alicia Deutscher, DO   Brief Narrative:  Alicia Vasquez is a 76 year old female with medical history significant of hypertension, DVT on chronic anticoagulation, hyperparathyroidism and breast cancer status post partial mastectomy and radiation who presented with complaint of worsening left hip pain. She was seen by [facility] 12 days PTA and had a MRI of the left hip. Review of report showed a large lesion of the left femoral neck, intertrochanteric region, greater/lesser trochanters, a high-grade partial tear of the left hamstring tendon, and abnormally thickened endometrium. However 2 days prior to admission patient report increased in hip pain to the point that she was unable to ambulate upon ED evaluation CT scan show pathologic fracture along with the findings of previous MRI. She was also found to have INR subtherapeutic. Prior to admission she was started on ciprofloxacin for suspected UTI. Patient underwent on total left hip arthroplasty. Postop was complicated by anemia for which she was transfused 1 pack of PRBCs. Now with drop in hemoglobin again.  Subjective: Patient seen and examined, she c/o weakness and slight SOB, Patient also report that she had some chest pain last night. Hbg down to 7.5 from 9. Patient afebrile   Assessment & Plan: Pathological hip fracture - due to mets  Status post total left hip arthroplasty Recent MRI shows signs of large 4.7 x 4.4 x 6.3 cm lesion on the left femoral neck, intertrochanteric region and greater/lesser trochanters. 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 Nitroglycerin when necessary  History of  breast cancer - now with mets to the bone  Management per oncology   UTI - abx course completed  On ceftriaxone, urine culture shows multiple species although patient was on Cipro prior to admission Will complete 3 days of antibiotic course ceftriaxone day 3/3  Acute blood loss anemia - further drop in Hgb today, 7.5 with symptoms  Likely postop vs anemia of malignancy CT of pelvis ordered, does not shows active bleed  Will transfuse another unit of PRBC's - 2nd during hospital stay   She was given IV Venofer 01/18/17, will give another dose in AM  Check cbc in AM   Hyperparathyroidism:Followed by Dr. Layla Maw.For this and last seen on 12/06/2016. At that time patient had PTH of 26 and a calcium 10.6.Patient being treated with high-dose vitamin D. resume  vitamin D  Hypercalcemia:Chronic. Mild elevation in calcium to 10.9 on admission. Suspect this could be secondary history of hyperparathyroidism vs. metastic disease.  Stable.   Endometrium hyperplasia:Acute. Seen on MRI report. Question possible source of cancer. Transvaginal ultrasound with endometrial thickening.   Hypothyroidism: Patient's last TSH was 1.31 on 12/05/16. Continue levothyroxine  Essential hypertension BP stable Will hold on BP med for now due to low hgb and BP soft   DVT prophylaxis: Warfarin Code Status: Full code Family Communication: family member at bedside Disposition Plan: SNF in the next 24-48 hours  Consultants:   Orthopedic surgery  Oncology   Procedures:   Left hip arthroplasty  Antimicrobials: Anti-infectives    Start     Dose/Rate Route Frequency Ordered Stop   01/16/17 2300  ceFAZolin (ANCEF) IVPB 2g/100 mL premix     2 g 200 mL/hr  over 30 Minutes Intravenous Every 6 hours 01/16/17 2047 01/17/17 0459   01/16/17 1532  ceFAZolin (ANCEF) 2-4 GM/100ML-% IVPB    Comments:  Bridget Hartshorn   : cabinet override      01/16/17 1532 01/16/17 1712   01/16/17 1000  ceFAZolin  (ANCEF) IVPB 2g/100 mL premix     2 g 200 mL/hr over 30 Minutes Intravenous On call to O.R. 01/16/17 0951 01/16/17 1712   01/16/17 0100  cefTRIAXone (ROCEPHIN) 1 g in dextrose 5 % 50 mL IVPB     1 g 100 mL/hr over 30 Minutes Intravenous Daily at bedtime 01/16/17 0018          Objective: Vitals:   01/18/17 0540 01/18/17 1406 01/18/17 2132 01/19/17 0541  BP: (!) 130/54 (!) 114/49 (!) 144/44 (!) 122/51  Pulse: (!) 101 86 (!) 105   Resp: 18 18 18 16   Temp: 98.5 F (36.9 C) 98.2 F (36.8 C) 98.6 F (37 C) 99.1 F (37.3 C)  TempSrc: Oral Oral Oral Oral  SpO2: 94% 97% 96% 97%  Weight:      Height:        Intake/Output Summary (Last 24 hours) at 01/19/17 9326 Last data filed at 01/19/17 0630  Gross per 24 hour  Intake               50 ml  Output             2000 ml  Net            -1950 ml   Filed Weights   01/15/17 2107  Weight: 93 kg (205 lb)    Examination:  General: Pt is alert, awake, not in acute distress Cardiovascular: RRR, S1/S2 +, no rubs, no gallops Respiratory: CTA bilaterally, no wheezing, no rhonchi Abdominal: Soft, NT, ND, bowel sounds + Extremities: RLE dressing clean, decrease ROM due to pain   Data Reviewed: I have personally reviewed following labs and imaging studies  CBC:  Recent Labs Lab 01/12/17 1423  01/15/17 2000 01/16/17 0132 01/16/17 1846 01/17/17 0259 01/18/17 0410 01/19/17 0427  WBC 6.1  --  8.1 6.9  --  10.2 11.3* 9.2  NEUTROABS 3.7  --  5.0  --   --   --   --   --   HGB 13.6  < > 13.4 12.9 10.9* 8.8* 9.0* 7.6*  HCT 41.1  < > 41.0 38.8 32.0* 26.4* 26.3* 22.6*  MCV 94.9  --  93.4 93.5  --  94.6 92.0 92.6  PLT 218  --  222 196  --  187 145* 158  < > = values in this interval not displayed. Basic Metabolic Panel:  Recent Labs Lab 01/15/17 2000 01/16/17 0132 01/16/17 1846 01/17/17 0259 01/18/17 0410 01/19/17 0427  NA 140 142 142 140 138 137  K 5.1 3.9 3.9 4.2 4.4 4.0  CL 108 109  --  110 108 106  CO2 26 27  --  23 25 25    GLUCOSE 103* 107* 120* 179* 119* 102*  BUN 19 16  --  14 18 11   CREATININE 0.75 0.78  --  0.73 0.65 0.52  CALCIUM 10.9* 10.3  --  9.4 9.3 9.2   GFR: Estimated Creatinine Clearance: 65.1 mL/min (by C-G formula based on SCr of 0.52 mg/dL). Liver Function Tests: No results for input(s): AST, ALT, ALKPHOS, BILITOT, PROT, ALBUMIN in the last 168 hours. No results for input(s): LIPASE, AMYLASE in the last 168 hours. No results for input(s):  AMMONIA in the last 168 hours. Coagulation Profile:  Recent Labs Lab 01/12/17 1423 01/15/17 2000 01/16/17 0956 01/17/17 1111 01/18/17 0410 01/19/17 0427  INR 1.20* 1.62 1.53 1.62 1.32 1.21  PROTIME 14.4*  --   --   --   --   --    Cardiac Enzymes:  Recent Labs Lab 01/16/17 0132 01/16/17 1101 01/16/17 2106 01/17/17 0259  TROPONINI <0.03 <0.03 <0.03 <0.03   BNP (last 3 results) No results for input(s): PROBNP in the last 8760 hours. HbA1C: No results for input(s): HGBA1C in the last 72 hours. CBG: No results for input(s): GLUCAP in the last 168 hours. Lipid Profile:  Recent Labs  01/17/17 0259  CHOL 125  HDL 32*  LDLCALC 80  TRIG 65  CHOLHDL 3.9   Thyroid Function Tests: No results for input(s): TSH, T4TOTAL, FREET4, T3FREE, THYROIDAB in the last 72 hours. Anemia Panel: No results for input(s): VITAMINB12, FOLATE, FERRITIN, TIBC, IRON, RETICCTPCT in the last 72 hours. Sepsis Labs: No results for input(s): PROCALCITON, LATICACIDVEN in the last 168 hours.  Recent Results (from the past 240 hour(s))  Urine culture     Status: Abnormal   Collection Time: 01/15/17  8:33 PM  Result Value Ref Range Status   Specimen Description URINE, RANDOM  Final   Special Requests NONE  Final   Culture MULTIPLE SPECIES PRESENT, SUGGEST RECOLLECTION (A)  Final   Report Status 01/18/2017 FINAL  Final  Surgical pcr screen     Status: None   Collection Time: 01/16/17  2:00 PM  Result Value Ref Range Status   MRSA, PCR NEGATIVE NEGATIVE Final     Staphylococcus aureus NEGATIVE NEGATIVE Final    Comment:        The Xpert SA Assay (FDA approved for NASAL specimens in patients over 47 years of age), is one component of a comprehensive surveillance program.  Test performance has been validated by [facility] for patients greater than or equal to 43 year old. It is not intended to diagnose infection nor to guide or monitor treatment.       Radiology Studies: No results found.    Scheduled Meds: . cholecalciferol  5,000 Units Oral Daily  . docusate sodium  100 mg Oral BID  . ferrous sulfate  325 mg Oral TID PC  . levothyroxine  125 mcg Oral QAC breakfast  . Warfarin - Pharmacist Dosing Inpatient   Does not apply q1800   Continuous Infusions: . sodium chloride    . cefTRIAXone (ROCEPHIN)  IV Stopped (01/18/17 2220)  . methocarbamol (ROBAXIN)  IV Stopped (01/16/17 1932)     LOS: 4 days    Time spent: Total of 25 minutes spent with pt, greater than 50% of which was spent in discussion of  treatment, counseling and coordination of care    Chipper Oman, MD Pager: Text Page via [email or web address]   If 7PM-7AM, please contact night-coverage [email or web address] 01/19/2017, 9:22 AM

## 2017-01-19 NOTE — Care Management Important Message (Signed)
Important Message  Patient Details  Name: Alicia Vasquez MRN: [id number] Date of Birth: 1940-07-10   Medicare Important Message Given:  Yes    Kerin Salen 01/19/2017, 11:04 AMImportant Message  Patient Details  Name: Alicia Vasquez MRN: [id number] Date of Birth: [birth year]   Medicare Important Message Given:  Yes    Kerin Salen 01/19/2017, 11:04 AM

## 2017-01-19 NOTE — Progress Notes (Signed)
ANTICOAGULATION CONSULT NOTE - Follow Up Consult  Pharmacy Consult for warfarin Indication: h/o DVT  Allergies  Allergen Reactions  . Anesthetics, Amide Hypertension  . Benadryl [Diphenhydramine Hcl] Other (See Comments)    Dizziness  . Carbocaine [Mepivacaine Hcl] Hypertension  . Codeine Other (See Comments)    Dizziness  . Epinephrine Hypertension  . Sulfa Antibiotics Other (See Comments)    dizziness  . Tramadol     Sedation.   Marland Kitchen Penicillins Rash    Patient Measurements: Height: 5\' 4"  (162.6 cm) Weight: 205 lb (93 kg) IBW/kg (Calculated) : 54.7  Vital Signs: Temp: 98.5 F (36.9 C) (08/23 0939) Temp Source: Oral (08/23 0939) BP: 109/40 (08/23 0939) Pulse Rate: 106 (08/23 0939)  Labs:  Recent Labs  01/16/17 2106 01/17/17 0259 01/17/17 1111 01/18/17 0410 01/19/17 0427  HGB  --  8.8*  --  9.0* 7.6*  HCT  --  26.4*  --  26.3* 22.6*  PLT  --  187  --  145* 158  LABPROT  --   --  19.4* 16.5* 15.3*  INR  --   --  1.62 1.32 1.21  CREATININE  --  0.73  --  0.65 0.52  TROPONINI <0.03 <0.03  --   --   --     Estimated Creatinine Clearance: 65.1 mL/min (by C-G formula based on SCr of 0.52 mg/dL).   Medical History: Past Medical History:  Diagnosis Date  . Allergy   . Anxiety   . Arthritis   . Blood transfusion without reported diagnosis   . Breast cancer ([place]) 07/12/2013   Invasive Mammary Carcinoma  . DVT (deep vein thrombosis) in pregnancy ([place])   . Hypertension   . Hypothyroid   . Personal history of radiation therapy   . Pneumonia   . Radiation 11/21/13-01/07/14   Right Breast/Supraclavicular     Assessment: 51 yoF on chronic warfarin therapy for history of DVT in 2014, history of breast cancer admitted with hip fracture. Warfarin was held (last dose 8/18) and patient started on Lovenox bridge on admission.  Patient underwent left THA 8/20 afternoon.  Last dose of Lovenox 1 mg/kg given early AM of 8/20 and INR 1.53 the morning of surgery.  Due to ABLA  during procedure, postop anticoagulation is to be delayed until 8/22 with warfarin per ortho instruction.  TRH consulted pharmacy to manage. TRH discussed Lovenox bridge with Ortho who recommended not starting Lovenox  PTA warfarin regimen reported as 7.5 mg daily except 10 mg on Tues/Thurs/Saturdays.  INR was subtherapeutic on admission at 1.62.  Today, 01/19/2017: - INR 1.21 - Hgb 7.6 - decreased due to blood loss following surgery, s/p 1 unit PRBC transfusion 8/21, another unit ordered today. Platelets 158K, improved from yesterday.  On ferrous sulfate and received IV ferric gluconate 125 mg yesterday.  Goal of Therapy:  INR 2-3   Plan:  Warfarin 7.5 mg today. Daily INR.  Hershal Coria 01/19/2017,11:21 AM

## 2017-01-20 ENCOUNTER — Other Ambulatory Visit: Payer: Self-pay | Admitting: Oncology

## 2017-01-20 DIAGNOSIS — M25552 Pain in left hip: Secondary | ICD-10-CM

## 2017-01-20 DIAGNOSIS — C50919 Malignant neoplasm of unspecified site of unspecified female breast: Secondary | ICD-10-CM

## 2017-01-20 DIAGNOSIS — Z170 Estrogen receptor positive status [ER+]: Secondary | ICD-10-CM

## 2017-01-20 DIAGNOSIS — Z79811 Long term (current) use of aromatase inhibitors: Secondary | ICD-10-CM

## 2017-01-20 DIAGNOSIS — C7951 Secondary malignant neoplasm of bone: Secondary | ICD-10-CM

## 2017-01-20 LAB — CBC WITH DIFFERENTIAL/PLATELET
BASOS PCT: 0 %
Basophils Absolute: 0 10*3/uL (ref 0.0–0.1)
Eosinophils Absolute: 0.3 10*3/uL (ref 0.0–0.7)
Eosinophils Relative: 3 %
HEMATOCRIT: 25.9 % — AB (ref 36.0–46.0)
HEMOGLOBIN: 8.8 g/dL — AB (ref 12.0–15.0)
LYMPHS ABS: 1.2 10*3/uL (ref 0.7–4.0)
LYMPHS PCT: 15 %
MCH: 31.3 pg (ref 26.0–34.0)
MCHC: 34 g/dL (ref 30.0–36.0)
MCV: 92.2 fL (ref 78.0–100.0)
MONO ABS: 1.4 10*3/uL — AB (ref 0.1–1.0)
MONOS PCT: 16 %
NEUTROS ABS: 5.5 10*3/uL (ref 1.7–7.7)
NEUTROS PCT: 66 %
Platelets: 170 10*3/uL (ref 150–400)
RBC: 2.81 MIL/uL — ABNORMAL LOW (ref 3.87–5.11)
RDW: 15 % (ref 11.5–15.5)
WBC: 8.3 10*3/uL (ref 4.0–10.5)

## 2017-01-20 LAB — TYPE AND SCREEN
ABO/RH(D): O POS
Antibody Screen: NEGATIVE
Unit division: 0
Unit division: 0

## 2017-01-20 LAB — BASIC METABOLIC PANEL
ANION GAP: 4 — AB (ref 5–15)
BUN: 16 mg/dL (ref 6–20)
CALCIUM: 9.2 mg/dL (ref 8.9–10.3)
CHLORIDE: 107 mmol/L (ref 101–111)
CO2: 25 mmol/L (ref 22–32)
Creatinine, Ser: 0.54 mg/dL (ref 0.44–1.00)
GFR calc Af Amer: >60 mL/min (ref >60)
GFR calc non Af Amer: >60 mL/min (ref >60)
GLUCOSE: 113 mg/dL — AB (ref 65–99)
Potassium: 4.1 mmol/L (ref 3.5–5.1)
Sodium: 136 mmol/L (ref 135–145)

## 2017-01-20 LAB — BPAM RBC
BLOOD PRODUCT EXPIRATION DATE: 201809212359
Blood Product Expiration Date: 201809252359
ISSUE DATE / TIME: 201808211243
ISSUE DATE / TIME: 201808231130
UNIT TYPE AND RH: 5100
UNIT TYPE AND RH: 5100

## 2017-01-20 LAB — PROTIME-INR
INR: 1.26
PROTHROMBIN TIME: 15.9 s — AB (ref 11.4–15.2)

## 2017-01-20 MED ORDER — SODIUM CHLORIDE 0.9 % IV SOLN
125.0000 mg | Freq: Once | INTRAVENOUS | Status: AC
  Administered 2017-01-20: 125 mg via INTRAVENOUS
  Filled 2017-01-20: qty 10

## 2017-01-20 MED ORDER — ANASTROZOLE 1 MG PO TABS
1.0000 mg | ORAL_TABLET | Freq: Every day | ORAL | Status: DC
  Administered 2017-01-20 – 2017-01-21 (×2): 1 mg via ORAL
  Filled 2017-01-20 (×2): qty 1

## 2017-01-20 MED ORDER — WARFARIN SODIUM 5 MG PO TABS
10.0000 mg | ORAL_TABLET | Freq: Once | ORAL | Status: AC
  Administered 2017-01-20: 10 mg via ORAL
  Filled 2017-01-20: qty 2

## 2017-01-20 NOTE — Progress Notes (Signed)
Alicia Vasquez   DOB:Oct 01, 1940   BW#:[id number]   JGO#:[id number]  Subjective: Ravin is still having difficulty moving and tells me standing was very difficult today. She does not think she is can to be able to leave the hospital for the next 2 or 3 days. She is having no problems with nausea or vomiting, pain is moderately well controlled and she is not aware of further bleeding problems. No family in room  Objective: Elderly white woman examined in bed Vitals:   01/20/17 0708 01/20/17 1422  BP: (!) 121/50 (!) 130/49  Pulse: 89   Resp: 18   Temp: 97.9 F (36.6 C) 98 F (36.7 C)  SpO2: 96% 95%    Body mass index is 35.19 kg/m.  Intake/Output Summary (Last 24 hours) at 01/20/17 1701 Last data filed at 01/20/17 1500  Gross per 24 hour  Intake              820 ml  Output             1050 ml  Net             -230 ml     CBG (last 3)  No results for input(s): GLUCAP in the last 72 hours.   Labs:  Lab Results  Component Value Date   WBC 8.3 01/20/2017   HGB 8.8 (L) 01/20/2017   HCT 25.9 (L) 01/20/2017   MCV 92.2 01/20/2017   PLT 170 01/20/2017   NEUTROABS 5.5 01/20/2017    '@LASTCHEMISTRY' @  Urine Studies No results for input(s): UHGB, CRYS in the last 72 hours.  Invalid input(s): UACOL, UAPR, USPG, UPH, UTP, UGL, UKET, UBIL, UNIT, UROB, [place], UEPI, UWBC, Junie Panning [place], [place], [place]  Basic Metabolic Panel:  Recent Labs Lab 01/16/17 0132 01/16/17 1846 01/17/17 0259 01/18/17 0410 01/19/17 0427 01/20/17 0353  NA 142 142 140 138 137 136  K 3.9 3.9 4.2 4.4 4.0 4.1  CL 109  --  110 108 106 107  CO2 27  --  '23 25 25 25  ' GLUCOSE 107* 120* 179* 119* 102* 113*  BUN 16  --  '14 18 11 16  ' CREATININE 0.78  --  0.73 0.65 0.52 0.54  CALCIUM 10.3  --  9.4 9.3 9.2 9.2   GFR Estimated Creatinine Clearance: 65.1 mL/min (by C-G formula based on SCr of 0.54 mg/dL). Liver Function Tests: No results for input(s): AST, ALT, ALKPHOS, BILITOT, PROT, ALBUMIN in the last 168  hours. No results for input(s): LIPASE, AMYLASE in the last 168 hours. No results for input(s): AMMONIA in the last 168 hours. Coagulation profile  Recent Labs Lab 01/16/17 0956 01/17/17 1111 01/18/17 0410 01/19/17 0427 01/20/17 0353  INR 1.53 1.62 1.32 1.21 1.26    CBC:  Recent Labs Lab 01/15/17 2000 01/16/17 0132 01/16/17 1846 01/17/17 0259 01/18/17 0410 01/19/17 0427 01/20/17 0353  WBC 8.1 6.9  --  10.2 11.3* 9.2 8.3  NEUTROABS 5.0  --   --   --   --   --  5.5  HGB 13.4 12.9 10.9* 8.8* 9.0* 7.6* 8.8*  HCT 41.0 38.8 32.0* 26.4* 26.3* 22.6* 25.9*  MCV 93.4 93.5  --  94.6 92.0 92.6 92.2  PLT 222 196  --  187 145* 158 170   Cardiac Enzymes:  Recent Labs Lab 01/16/17 0132 01/16/17 1101 01/16/17 2106 01/17/17 0259  TROPONINI <0.03 <0.03 <0.03 <0.03   BNP: Invalid input(s): POCBNP CBG: No results for input(s): GLUCAP in the last 168  hours. D-Dimer No results for input(s): DDIMER in the last 72 hours. Hgb A1c No results for input(s): HGBA1C in the last 72 hours. Lipid Profile No results for input(s): CHOL, HDL, LDLCALC, TRIG, CHOLHDL, LDLDIRECT in the last 72 hours. Thyroid function studies No results for input(s): TSH, T4TOTAL, T3FREE, THYROIDAB in the last 72 hours.  Invalid input(s): FREET3 Anemia work up No results for input(s): VITAMINB12, FOLATE, FERRITIN, TIBC, IRON, RETICCTPCT in the last 72 hours. Microbiology Recent Results (from the past 240 hour(s))  Urine culture     Status: Abnormal   Collection Time: 01/15/17  8:33 PM  Result Value Ref Range Status   Specimen Description URINE, RANDOM  Final   Special Requests NONE  Final   Culture MULTIPLE SPECIES PRESENT, SUGGEST RECOLLECTION (A)  Final   Report Status 01/18/2017 FINAL  Final  Surgical pcr screen     Status: None   Collection Time: 01/16/17  2:00 PM  Result Value Ref Range Status   MRSA, PCR NEGATIVE NEGATIVE Final   Staphylococcus aureus NEGATIVE NEGATIVE Final    Comment:         The Xpert SA Assay (FDA approved for NASAL specimens in patients over 26 years of age), is one component of a comprehensive surveillance program.  Test performance has been validated by [facility] for patients greater than or equal to 60 year old. It is not intended to diagnose infection nor to guide or monitor treatment.       Studies:  Ct Pelvis Wo Contrast  Result Date: 01/19/2017 CLINICAL DATA:  Patient status post left hip replacement 01/16/2017 for a pathologic fracture of the left femoral neck due to metastatic breast carcinoma. Continued left hip pain. EXAM: CT PELVIS WITHOUT CONTRAST TECHNIQUE: Multidetector CT imaging of the pelvis was performed following the standard protocol without intravenous contrast. COMPARISON:  CT abdomen and pelvis 01/15/2017. Plain films of the pelvis left hip 01/16/2017. FINDINGS: Since the prior CT scan, the patient has undergone left total hip replacement. The device is located. There is a periprosthetic fracture of the left femur. The fracture is nondisplaced and extends through the anterior cortex of the from a point approximately 8 cm below the top of the greater trochanter inferiorly through the lateral cortex approximately 4 cm below the tip of the femoral stem. No other fracture is identified. No focal lesion is seen. Gas in the soft tissues of the anterior and medial compartments of the left thighs presumably postoperative. Gas extends into the short adductor superiorly. There s hematoma in subcutaneous fat of the left buttock most consistent with postoperative change. Lower lumbar spondylosis is noted. Atherosclerotic vascular disease identified. IMPRESSION: Status post left total hip replacement with a nondisplaced periprosthetic fracture extending approximately 8 cm below the top of the greater trochanter to approximately 4 cm below the tip of the femoral stem. Electronically Signed   By: Inge Rise M.D.   On: 01/19/2017 10:55     Assessment: 76 y.o. 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 It also predicts no benefit  from chemotherapy  (3) adjuvant radiation completed 01/07/2014  (4) anastrozole started 02/27/2014 stopped within 2 weeks because of arm swelling.  (a) bone density April 2016 showed osteopenia, with a t-score of -1.6 (b) anastrozole resumed 12/17/2015--taken inconsistently by patient  (5) history of left lower extremity DVT 11/23/2012, initially on rivaroxaban, which causes chest pain, switch to Coumadin July 2014  METASTATIC DISEASE: August 2018 (6) status post left total hip arthroplasty 01/16/2017, pathology confirming metastatic breast cancer  (7) continuing anastrozole; we will start Faslodex as outpatient 01/26/2017, we will add palbociclib subsequently  (8) radiation to the left hip area pending   Plan:  I met with Stanton Kidney in her room today and gave her a copy of the pathology report, which shows adenocarcinoma, estrogen receptor positive, consistent with metastatic breast cancer.  We are continuing the anastrozole. She tells me today that she took it very intermittently. I'm going to add fulvestrant when she returns to see me 01/26/2017 and once we are able to obtain palbociclib for her she will start on that as well. I'm hopeful with those 3 medications this systemic disease will be well-controlled  She may benefit from radiation oncology to the left hip  area and I will have a consult set up for her with radiation oncology also for 01/26/2017  Appreciate your help to this patient. Will follow with you while in hospital.   Chauncey Cruel, MD 01/20/2017  5:01 PM Medical Oncology and Hematology [facility] [address] [place], [place] [address] Tel. [phone]    Fax. [phone]

## 2017-01-20 NOTE — Progress Notes (Signed)
CNA KP and RN had to turn pt slightly to get her bed changed from being wet, turning her with our hands on the back of her left shoulder and her back with a pillow between her legs, pt was only turned a few inches onto her R side just enough to get old bed linen out and new on, because that is as much as she could tolerate. Later she was telling family at bedside that RN and CNA twisted her L arm and L leg and now it was hurting while RN was in room. RN reminded pt and informed family at bedside of actual events.

## 2017-01-20 NOTE — Progress Notes (Signed)
ANTICOAGULATION CONSULT NOTE - Follow Up Consult  Pharmacy Consult for warfarin Indication: h/o DVT  Allergies  Allergen Reactions  . Anesthetics, Amide Hypertension  . Benadryl [Diphenhydramine Hcl] Other (See Comments)    Dizziness  . Carbocaine [Mepivacaine Hcl] Hypertension  . Codeine Other (See Comments)    Dizziness  . Epinephrine Hypertension  . Sulfa Antibiotics Other (See Comments)    dizziness  . Tramadol     Sedation.   Marland Kitchen Penicillins Rash    Patient Measurements: Height: 5\' 4"  (162.6 cm) Weight: 205 lb (93 kg) IBW/kg (Calculated) : 54.7  Vital Signs: Temp: 97.9 F (36.6 C) (08/24 0708) Temp Source: Oral (08/24 0708) BP: 121/50 (08/24 0708) Pulse Rate: 89 (08/24 0708)  Labs:  Recent Labs  01/18/17 0410 01/19/17 0427 01/20/17 0353  HGB 9.0* 7.6* 8.8*  HCT 26.3* 22.6* 25.9*  PLT 145* 158 170  LABPROT 16.5* 15.3* 15.9*  INR 1.32 1.21 1.26  CREATININE 0.65 0.52 0.54    Estimated Creatinine Clearance: 65.1 mL/min (by C-G formula based on SCr of 0.54 mg/dL).   Medical History: Past Medical History:  Diagnosis Date  . Allergy   . Anxiety   . Arthritis   . Blood transfusion without reported diagnosis   . Breast cancer ([facility]) 07/12/2013   Invasive Mammary Carcinoma  . DVT (deep vein thrombosis) in pregnancy ([place])   . Hypertension   . Hypothyroid   . Personal history of radiation therapy   . Pneumonia   . Radiation 11/21/13-01/07/14   Right Breast/Supraclavicular     Assessment: 37 yoF on chronic warfarin therapy for history of DVT in 2014, history of breast cancer admitted with hip fracture. Warfarin was held (last dose 8/18) and patient started on Lovenox bridge on admission.  Patient underwent left THA 8/20 afternoon.  Last dose of Lovenox 1 mg/kg given early AM of 8/20 and INR 1.53 the morning of surgery.  Due to ABLA during procedure, postop anticoagulation is to be delayed until 8/22 with warfarin per ortho instruction.  TRH consulted pharmacy  to manage. TRH discussed Lovenox bridge with Ortho who recommended not starting Lovenox  PTA warfarin regimen reported as 7.5 mg daily except 10 mg on Tues/Thurs/Saturdays.  INR was subtherapeutic on admission at 1.62.  Today, 01/20/2017: - INR subtherapeutic this AM and not responding much as of yet - warfarin dose of 7.5mg  x 2 since surgery so far as inpatient - Hgb improved s/p blood 8/23  Goal of Therapy:  INR 2-3   Plan:  1) Increase warfarin to 10mg  today 2) Daily INR.   Adrian Saran, PharmD, BCPS Pager [phone] 01/20/2017 10:58 AM

## 2017-01-20 NOTE — Progress Notes (Signed)
Physical Therapy Treatment Patient Details Name: Alicia Vasquez MRN: [id number] DOB: 20-Aug-1940 Today's Date: 01/20/2017    History of Present Illness Pt admitted s/p L femoral neck pathological fx with posterior THR 01/16/17.  Pt with hx of breast CA    PT Comments    Pt continues ltd by anxiety, c/o pain and focus on disparate pains over entire body.  Pt requiring ++ encouragement and constant cues to gain ltd self-assistance toward task completion.  Follow Up Recommendations  SNF     Equipment Recommendations  None recommended by PT    Recommendations for Other Services OT consult     Precautions / Restrictions Precautions Precautions: Posterior Hip;Fall Precaution Booklet Issued: Yes (comment) Precaution Comments: Pt recalls 2/3 THP without cues Restrictions Weight Bearing Restrictions: Yes LLE Weight Bearing: Partial weight bearing LLE Partial Weight Bearing Percentage or Pounds: 50    Mobility  Bed Mobility Overal bed mobility: Needs Assistance Bed Mobility: Supine to Sit;Sit to Supine     Supine to sit: Max assist;+2 for physical assistance;+2 for safety/equipment Sit to supine: Max assist;+2 for physical assistance;+2 for safety/equipment   General bed mobility comments: Cues for sequence and use of R LE to self assist but with minimal follow through by pt.   Pt assisted to/from EOB with use of pad  Transfers Overall transfer level: Needs assistance Equipment used: Rolling walker (2 wheeled) Transfers: Sit to/from Stand Sit to Stand: Mod assist;+2 physical assistance;+2 safety/equipment;From elevated surface         General transfer comment: Cues for transition position and use of UEs to self assist.  Ambulation/Gait             General Gait Details: Pt stood only but unable to advance either foot.  Pt screaming with attempts to move L LE.  Pt stood ~30min before c/o R LE giving way and returned to sitting.   Stairs            Wheelchair  Mobility    Modified Rankin (Stroke Patients Only)       Balance                                            Cognition Arousal/Alertness: Awake/alert Behavior During Therapy: WFL for tasks assessed/performed Overall Cognitive Status: Within Functional Limits for tasks assessed                                        Exercises Total Joint Exercises Ankle Circles/Pumps: AROM;Both;15 reps;Supine Heel Slides: AAROM;Left;15 reps;Supine Hip ABduction/ADduction: AAROM;Left;10 reps;Supine    General Comments        Pertinent Vitals/Pain Pain Assessment: Faces Faces Pain Scale: Hurts whole lot Pain Location: L hip, groin. side, knee, shoulder and ankle Pain Descriptors / Indicators: Aching;Sore;Grimacing;Guarding Pain Intervention(s): Limited activity within patient's tolerance;Monitored during session;Premedicated before session;Ice applied;Patient requesting pain meds-RN notified    Home Living                      Prior Function            PT Goals (current goals can now be found in the care plan section) Acute Rehab PT Goals Patient Stated Goal: Regain IND PT Goal Formulation: With patient Time For Goal Achievement: 01/28/17 Potential to  Achieve Goals: Good Progress towards PT goals: Progressing toward goals    Frequency    Min 4X/week      PT Plan Current plan remains appropriate    Co-evaluation              AM-PAC PT "6 Clicks" Daily Activity  Outcome Measure  Difficulty turning over in bed (including adjusting bedclothes, sheets and blankets)?: Unable Difficulty moving from lying on back to sitting on the side of the bed? : Unable Difficulty sitting down on and standing up from a chair with arms (e.g., wheelchair, bedside commode, etc,.)?: Unable Help needed moving to and from a bed to chair (including a wheelchair)?: Total Help needed walking in hospital room?: Total Help needed climbing 3-5 steps with  a railing? : Total 6 Click Score: 6    End of Session Equipment Utilized During Treatment: Gait belt Activity Tolerance: Patient limited by fatigue;Patient limited by pain;Other (comment) Patient left: in bed;with call bell/phone within reach Nurse Communication: Mobility status PT Visit Diagnosis: Pain;Other (comment) (anxiety) Pain - Right/Left: Left Pain - part of body: Hip;Leg     Time: 9390-3009 PT Time Calculation (min) (ACUTE ONLY): 60 min  Charges:  $Therapeutic Exercise: 8-22 mins $Therapeutic Activity: 23-37 mins                    G Codes:       Pg [phone]    Juddson Cobern 01/20/2017, 12:59 PM

## 2017-01-20 NOTE — Progress Notes (Unsigned)
I am setting [place] up for fulvestrant starting August 30 and Xgeva on the same date. She will start palbociclib with the second cycle. I have requested a radiation oncology consult to same day

## 2017-01-20 NOTE — Progress Notes (Signed)
PROGRESS NOTE [facility]   Sparkle Aube Tomkinson   EUM:[id number] DOB: 07-12-1940  DOA: 01/15/2017 PCP: Briscoe Deutscher, DO   Brief Narrative:  Alicia Vasquez is a 76 year old female with medical history significant of hypertension, DVT on chronic anticoagulation, hyperparathyroidism and breast cancer status post partial mastectomy and radiation who presented with complaint of worsening left hip pain. She was seen by [facility] 12 days PTA and had a MRI of the left hip. Review of report showed a large lesion of the left femoral neck, intertrochanteric region, greater/lesser trochanters, a high-grade partial tear of the left hamstring tendon, and abnormally thickened endometrium. However 2 days prior to admission patient report increased in hip pain to the point that she was unable to ambulate upon ED evaluation CT scan show pathologic fracture along with the findings of previous MRI. She was also found to have INR subtherapeutic. Prior to admission she was started on ciprofloxacin for suspected UTI. Patient underwent on total left hip arthroplasty. Postop was complicated by anemia for which she was transfused 2 pack of PRBCs.   Subjective: Patient seen and examined, is doing relatively well, work with PT today. Hemoglobin is stable. 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 Layla Maw.For this and last seen on 12/06/2016. At that time patient had PTH of 26 and a calcium 10.6.Patient being treated with high-dose vitamin D. Continue Vit D   Hypercalcemia:Chronic. Mild elevation in calcium to 10.9 on admission. Suspect this could be secondary history of hyperparathyroidism vs. metastic disease.  Now stable at 9.2  Endometrium hyperplasia:Acute. Seen on MRI report. Question possible source of cancer. Transvaginal ultrasound with endometrial thickening. Defer to oncology for further recommendations   Hypothyroidism: Patient's last TSH was 1.31 on 12/05/16. Continue levothyroxine  Essential hypertension BP stable Continue to hold on BP meds for now   DVT prophylaxis: Warfarin Code Status: Full code Family Communication: family member at bedside Disposition Plan: SNF in 24 hrs  Consultants:   Orthopedic surgery  Oncology   Procedures:   Left hip arthroplasty  Antimicrobials: Anti-infectives    Start     Dose/Rate Route Frequency Ordered Stop   01/16/17 2300  ceFAZolin (ANCEF) IVPB 2g/100 mL premix     2 g 200  mL/hr over 30 Minutes Intravenous Every 6 hours 01/16/17 2047 01/17/17 0459   01/16/17 1532  ceFAZolin (ANCEF) 2-4 GM/100ML-% IVPB    Comments:  Bridget Hartshorn   : cabinet override      01/16/17 1532 01/16/17 1712   01/16/17  1000  ceFAZolin (ANCEF) IVPB 2g/100 mL premix     2 g 200 mL/hr over 30 Minutes Intravenous On call to O.R. 01/16/17 0951 01/16/17 1712   01/16/17 0100  cefTRIAXone (ROCEPHIN) 1 g in dextrose 5 % 50 mL IVPB  Status:  Discontinued     1 g 100 mL/hr over 30 Minutes Intravenous Daily at bedtime 01/16/17 0018 01/20/17 0849        Objective: Vitals:   01/19/17 1155 01/19/17 1445 01/19/17 2150 01/20/17 0708  BP: (!) 124/58 (!) 133/59 (!) 144/64 (!) 121/50  Pulse: 96 92 95 89  Resp: 16 18 18 18   Temp: 98.5 F (36.9 C) 98.2 F (36.8 C) 98 F (36.7 C) 97.9 F (36.6 C)  TempSrc: Oral Oral Oral Oral  SpO2: 98% 97% 100% 96%  Weight:      Height:        Intake/Output Summary (Last 24 hours) at 01/20/17 1359 Last data filed at 01/20/17 0700  Gross per 24 hour  Intake              570 ml  Output              800 ml  Net             -230 ml   Filed Weights   01/15/17 2107  Weight: 93 kg (205 lb)    Examination:  General: Pt is alert, awake, not in acute distress Cardiovascular: RRR, S1/S2 +, no rubs, no gallops Respiratory: CTA bilaterally, no wheezing, no rhonchi Abdominal: Soft, NT, ND, bowel sounds + Extremities: RLE dressing clean, decrease ROM due to pain   Data Reviewed: I have personally reviewed following labs and imaging studies  CBC:  Recent Labs Lab 01/15/17 2000 01/16/17 0132 01/16/17 1846 01/17/17 0259 01/18/17 0410 01/19/17 0427 01/20/17 0353  WBC 8.1 6.9  --  10.2 11.3* 9.2 8.3  NEUTROABS 5.0  --   --   --   --   --  5.5  HGB 13.4 12.9 10.9* 8.8* 9.0* 7.6* 8.8*  HCT 41.0 38.8 32.0* 26.4* 26.3* 22.6* 25.9*  MCV 93.4 93.5  --  94.6 92.0 92.6 92.2  PLT 222 196  --  187 145* 158 025   Basic Metabolic Panel:  Recent Labs Lab 01/16/17 0132 01/16/17 1846 01/17/17 0259 01/18/17 0410 01/19/17 0427 01/20/17 0353  NA 142 142 140 138 137 136  K 3.9 3.9 4.2 4.4 4.0 4.1  CL 109  --  110 108 106 107  CO2 27  --  23 25 25 25   GLUCOSE 107* 120* 179* 119*  102* 113*  BUN 16  --  14 18 11 16   CREATININE 0.78  --  0.73 0.65 0.52 0.54  CALCIUM 10.3  --  9.4 9.3 9.2 9.2   GFR: Estimated Creatinine Clearance: 65.1 mL/min (by C-G formula based on SCr of 0.54 mg/dL). Liver Function Tests: No results for input(s): AST, ALT, ALKPHOS, BILITOT, PROT, ALBUMIN in the last 168 hours. No results for input(s): LIPASE, AMYLASE in the last 168 hours. No results for input(s): AMMONIA in the last 168 hours. Coagulation Profile:  Recent Labs Lab 01/16/17 8527 01/17/17 1111 01/18/17 0410 01/19/17 0427 01/20/17 7824  INR 1.53 1.62 1.32 1.21 1.26   Cardiac Enzymes:  Recent Labs Lab 01/16/17 0132 01/16/17 1101 01/16/17 2106 01/17/17 0259  TROPONINI <0.03 <0.03 <0.03 <0.03   BNP (last 3 results) No results for input(s): PROBNP in the last 8760 hours. HbA1C: No results for input(s): HGBA1C in the last 72 hours. CBG: No results for input(s): GLUCAP in the last 168 hours. Lipid Profile: No results for input(s): CHOL, HDL, LDLCALC, TRIG, CHOLHDL, LDLDIRECT in the last 72 hours. Thyroid Function Tests: No results for input(s): TSH, T4TOTAL, FREET4, T3FREE, THYROIDAB in the last 72 hours. Anemia Panel: No results for input(s): VITAMINB12, FOLATE, FERRITIN, TIBC, IRON, RETICCTPCT in the last 72 hours. Sepsis Labs: No results for input(s): PROCALCITON, LATICACIDVEN in the last 168 hours.  Recent Results (from the past 240 hour(s))  Urine culture     Status: Abnormal   Collection Time: 01/15/17  8:33 PM  Result Value Ref Range Status   Specimen Description URINE, RANDOM  Final   Special Requests NONE  Final   Culture MULTIPLE SPECIES PRESENT, SUGGEST RECOLLECTION (A)  Final   Report Status 01/18/2017 FINAL  Final  Surgical pcr screen     Status: None   Collection Time: 01/16/17  2:00 PM  Result Value Ref Range Status   MRSA, PCR NEGATIVE NEGATIVE Final   Staphylococcus aureus NEGATIVE NEGATIVE Final    Comment:        The Xpert SA Assay  (FDA approved for NASAL specimens in patients over 69 years of age), is one component of a comprehensive surveillance program.  Test performance has been validated by [facility] for patients greater than or equal to 110 year old. It is not intended to diagnose infection nor to guide or monitor treatment.       Radiology Studies: Ct Pelvis Wo Contrast  Result Date: 01/19/2017 CLINICAL DATA:  Patient status post left hip replacement 01/16/2017 for a pathologic fracture of the left femoral neck due to metastatic breast carcinoma. Continued left hip pain. EXAM: CT PELVIS WITHOUT CONTRAST TECHNIQUE: Multidetector CT imaging of the pelvis was performed following the standard protocol without intravenous contrast. COMPARISON:  CT abdomen and pelvis 01/15/2017. Plain films of the pelvis left hip 01/16/2017. FINDINGS: Since the prior CT scan, the patient has undergone left total hip replacement. The device is located. There is a periprosthetic fracture of the left femur. The fracture is nondisplaced and extends through the anterior cortex of the from a point approximately 8 cm below the top of the greater trochanter inferiorly through the lateral cortex approximately 4 cm below the tip of the femoral stem. No other fracture is identified. No focal lesion is seen. Gas in the soft tissues of the anterior and medial compartments of the left thighs presumably postoperative. Gas extends into the short adductor superiorly. There s hematoma in subcutaneous fat of the left buttock most consistent with postoperative change. Lower lumbar spondylosis is noted. Atherosclerotic vascular disease identified. IMPRESSION: Status post left total hip replacement with a nondisplaced periprosthetic fracture extending approximately 8 cm below the top of the greater trochanter to approximately 4 cm below the tip of the femoral stem. Electronically Signed   By: Inge Rise M.D.   On: 01/19/2017 10:55      Scheduled  Meds: . cholecalciferol  5,000 Units Oral Daily  . docusate sodium  100 mg Oral BID  . ferrous sulfate  325 mg Oral TID PC  . levothyroxine  125 mcg Oral QAC breakfast  . warfarin  10 mg Oral ONCE-1800  . Warfarin - Pharmacist Dosing Inpatient   Does not apply q1800   Continuous Infusions: . methocarbamol (ROBAXIN)  IV Stopped (01/16/17 1932)     LOS: 5 days    Time spent: Total of 15 minutes spent with pt, greater than 50% of which was spent in discussion of  treatment, counseling and coordination of care    Chipper Oman, MD Pager: Text Page via [email or web address]   If 7PM-7AM, please contact night-coverage [email or web address] 01/20/2017, 1:59 PM

## 2017-01-21 ENCOUNTER — Other Ambulatory Visit: Payer: Self-pay | Admitting: Oncology

## 2017-01-21 DIAGNOSIS — R278 Other lack of coordination: Secondary | ICD-10-CM | POA: Diagnosis not present

## 2017-01-21 DIAGNOSIS — R262 Difficulty in walking, not elsewhere classified: Secondary | ICD-10-CM | POA: Diagnosis not present

## 2017-01-21 DIAGNOSIS — Z884 Allergy status to anesthetic agent status: Secondary | ICD-10-CM | POA: Diagnosis not present

## 2017-01-21 DIAGNOSIS — Z170 Estrogen receptor positive status [ER+]: Secondary | ICD-10-CM | POA: Diagnosis not present

## 2017-01-21 DIAGNOSIS — M84659P Pathological fracture in other disease, hip, unspecified, subsequent encounter for fracture with malunion: Secondary | ICD-10-CM

## 2017-01-21 DIAGNOSIS — C7951 Secondary malignant neoplasm of bone: Secondary | ICD-10-CM | POA: Diagnosis present

## 2017-01-21 DIAGNOSIS — Z96642 Presence of left artificial hip joint: Secondary | ICD-10-CM | POA: Diagnosis not present

## 2017-01-21 DIAGNOSIS — R2681 Unsteadiness on feet: Secondary | ICD-10-CM | POA: Diagnosis not present

## 2017-01-21 DIAGNOSIS — M84559A Pathological fracture in neoplastic disease, hip, unspecified, initial encounter for fracture: Secondary | ICD-10-CM | POA: Diagnosis not present

## 2017-01-21 DIAGNOSIS — C7981 Secondary malignant neoplasm of breast: Secondary | ICD-10-CM | POA: Diagnosis not present

## 2017-01-21 DIAGNOSIS — Z471 Aftercare following joint replacement surgery: Secondary | ICD-10-CM | POA: Diagnosis not present

## 2017-01-21 DIAGNOSIS — N8500 Endometrial hyperplasia, unspecified: Secondary | ICD-10-CM | POA: Diagnosis not present

## 2017-01-21 DIAGNOSIS — Z853 Personal history of malignant neoplasm of breast: Secondary | ICD-10-CM | POA: Diagnosis not present

## 2017-01-21 DIAGNOSIS — C50919 Malignant neoplasm of unspecified site of unspecified female breast: Secondary | ICD-10-CM | POA: Diagnosis present

## 2017-01-21 DIAGNOSIS — T8189XA Other complications of procedures, not elsewhere classified, initial encounter: Secondary | ICD-10-CM | POA: Diagnosis not present

## 2017-01-21 DIAGNOSIS — Z885 Allergy status to narcotic agent status: Secondary | ICD-10-CM | POA: Diagnosis not present

## 2017-01-21 DIAGNOSIS — M25552 Pain in left hip: Secondary | ICD-10-CM | POA: Diagnosis not present

## 2017-01-21 DIAGNOSIS — E213 Hyperparathyroidism, unspecified: Secondary | ICD-10-CM | POA: Diagnosis not present

## 2017-01-21 DIAGNOSIS — Z888 Allergy status to other drugs, medicaments and biological substances status: Secondary | ICD-10-CM | POA: Diagnosis not present

## 2017-01-21 DIAGNOSIS — C50411 Malignant neoplasm of upper-outer quadrant of right female breast: Secondary | ICD-10-CM | POA: Diagnosis not present

## 2017-01-21 DIAGNOSIS — I82403 Acute embolism and thrombosis of unspecified deep veins of lower extremity, bilateral: Secondary | ICD-10-CM | POA: Diagnosis present

## 2017-01-21 DIAGNOSIS — E039 Hypothyroidism, unspecified: Secondary | ICD-10-CM | POA: Diagnosis not present

## 2017-01-21 DIAGNOSIS — C4090 Malignant neoplasm of unspecified bones and articular cartilage of unspecified limb: Secondary | ICD-10-CM | POA: Diagnosis not present

## 2017-01-21 DIAGNOSIS — M6281 Muscle weakness (generalized): Secondary | ICD-10-CM | POA: Diagnosis not present

## 2017-01-21 DIAGNOSIS — T8131XA Disruption of external operation (surgical) wound, not elsewhere classified, initial encounter: Secondary | ICD-10-CM | POA: Diagnosis not present

## 2017-01-21 DIAGNOSIS — N390 Urinary tract infection, site not specified: Secondary | ICD-10-CM | POA: Diagnosis not present

## 2017-01-21 DIAGNOSIS — C801 Malignant (primary) neoplasm, unspecified: Secondary | ICD-10-CM | POA: Diagnosis not present

## 2017-01-21 DIAGNOSIS — Z882 Allergy status to sulfonamides status: Secondary | ICD-10-CM | POA: Diagnosis not present

## 2017-01-21 DIAGNOSIS — Z79811 Long term (current) use of aromatase inhibitors: Secondary | ICD-10-CM | POA: Diagnosis not present

## 2017-01-21 DIAGNOSIS — D649 Anemia, unspecified: Secondary | ICD-10-CM | POA: Diagnosis present

## 2017-01-21 DIAGNOSIS — M9702XA Periprosthetic fracture around internal prosthetic left hip joint, initial encounter: Secondary | ICD-10-CM | POA: Diagnosis present

## 2017-01-21 DIAGNOSIS — Z880 Allergy status to penicillin: Secondary | ICD-10-CM | POA: Diagnosis not present

## 2017-01-21 DIAGNOSIS — C799 Secondary malignant neoplasm of unspecified site: Secondary | ICD-10-CM | POA: Diagnosis not present

## 2017-01-21 DIAGNOSIS — S79911A Unspecified injury of right hip, initial encounter: Secondary | ICD-10-CM | POA: Diagnosis not present

## 2017-01-21 DIAGNOSIS — R2689 Other abnormalities of gait and mobility: Secondary | ICD-10-CM | POA: Diagnosis not present

## 2017-01-21 DIAGNOSIS — G893 Neoplasm related pain (acute) (chronic): Secondary | ICD-10-CM | POA: Diagnosis not present

## 2017-01-21 DIAGNOSIS — Z923 Personal history of irradiation: Secondary | ICD-10-CM | POA: Diagnosis not present

## 2017-01-21 LAB — CBC
HEMATOCRIT: 26.4 % — AB (ref 36.0–46.0)
Hemoglobin: 8.8 g/dL — ABNORMAL LOW (ref 12.0–15.0)
MCH: 31.2 pg (ref 26.0–34.0)
MCHC: 33.3 g/dL (ref 30.0–36.0)
MCV: 93.6 fL (ref 78.0–100.0)
Platelets: 208 10*3/uL (ref 150–400)
RBC: 2.82 MIL/uL — AB (ref 3.87–5.11)
RDW: 15 % (ref 11.5–15.5)
WBC: 7.7 10*3/uL (ref 4.0–10.5)

## 2017-01-21 LAB — PROTIME-INR
INR: 1.39
Prothrombin Time: 17.2 seconds — ABNORMAL HIGH (ref 11.4–15.2)

## 2017-01-21 MED ORDER — FERROUS SULFATE 325 (65 FE) MG PO TABS: 325.0000 mg | ORAL_TABLET | Freq: Three times a day (TID) | ORAL | 0 refills | Status: DC

## 2017-01-21 MED ORDER — WARFARIN SODIUM 5 MG PO TABS: 7.5000 mg | ORAL_TABLET | ORAL | Status: DC

## 2017-01-21 MED ORDER — ANASTROZOLE 1 MG PO TABS: 1.0000 mg | ORAL_TABLET | Freq: Every day | ORAL | 0 refills | Status: DC

## 2017-01-21 MED ORDER — WARFARIN SODIUM 5 MG PO TABS
10.0000 mg | ORAL_TABLET | Freq: Once | ORAL | Status: AC
  Administered 2017-01-21: 10 mg via ORAL
  Filled 2017-01-21: qty 2

## 2017-01-21 MED ORDER — DOCUSATE SODIUM 100 MG PO CAPS: 100.0000 mg | ORAL_CAPSULE | Freq: Two times a day (BID) | ORAL | 0 refills | Status: DC

## 2017-01-21 MED ORDER — POLYETHYLENE GLYCOL 3350 17 G PO PACK: 17.0000 g | PACK | Freq: Every day | ORAL | 0 refills | Status: DC | PRN

## 2017-01-21 NOTE — Discharge Summary (Signed)
Physician Discharge Summary  Malaysha Arlen Alix  KGY:[id number]  DOB: 02-23-1941  DOA: 01/15/2017 PCP: Briscoe Deutscher, DO  Admit date: 01/15/2017 Discharge date: 01/21/2017  Admitted From: Home  Disposition:  SNF   Recommendations for Outpatient Follow-up:  1. Follow up with PCP in 1 week 2. Please obtain BMP/CBC in one week to monitor Hgb and Renal function  3. Please obtain daily INR's and adjust warfarin to INR goal of 2-3  4. Please follow up with oncology on 01/26/17 - Dr. Jana Hakim  5. Posterior hip precautions and PWB  6. Follow up with Dr Alvan Dame (Orthopedic) in 2 weeks  7. Dressing to remain in place until orthopedic follow up  8. BP medications on hold as, BP has been stable off medications   Discharge Condition: Stable  CODE STATUS: FULL  Diet recommendation: Heart Healthy   Brief/Interim Summary: For full details see H&P and progress notes but in brief. Illana Nolting Mayfield is a 76 year old female with medical history significant of hypertension, DVT on chronic anticoagulation, hyperparathyroidism and breast cancer status post partial mastectomy and radiation who presented with complaint of worsening left hip pain. She was seen by [facility] 12 days PTA and had a MRI of the left hip. Review of report showed a large lesion of the left femoral neck, intertrochanteric region, greater/lesser trochanters, a high-grade partial tear of the left hamstring tendon, and abnormally thickened endometrium. However 2 days prior to admission patient report increased in hip pain to the point that she was unable to ambulate upon ED evaluation CT scan show pathologic fracture along with the findings of previous MRI. She was also found to have INR subtherapeutic. Prior to admission she was started on ciprofloxacin for suspected UTI. Patient underwent on total left hip arthroplasty. Postop was complicated by anemia for which she was transfused 2 pack of PRBCs and given 2 doses of IV iron. Her Hgb now has  remained stable at 8.8, with no sings of bleeding. Patient has worked with PT whom is recommending STR. Patient was evaluated by oncology, given bone biopsy reported metastatic breast carcer. Patient was started on Anastrazole  And is schedule to follow up with oncology on 01/26/17 fur further recommendations   Subjective: Patient seen and examined, she remains stable, anxious about discharge. No acute events overnight, up with PT and tolerating diet well. 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  History of breast cancer - now with mets to the bone  Management per oncology  Follow up 01/26/17 Patient was started on Anastrazole    UTI - abx course completed  Urine culture shows multiple species although patient was on Cipro prior to admission Completed course of ceftriaxone for 3 days  Acute blood loss anemia -  Likely postop vs anemia of malignancy CT of pelvis ordered, does not shows active bleed  Status post 2 units of PRBCs during hospital stay IV Venofer given 2 during hospital stay Continue Fe supplement TID  Monitor CBC in 1 week   Chronic DVT  Coumadin initially  stopped due to surgery  INR goal 2-3, can be titrated at SNF, patient does not need to be bridge with lovenox  Continue warfarin home regimen 7.5 mg daily except 63m on Tues/Thurs/Sat Daily INR for now, INR upon discharge 1.32  Hyperparathyroidism:Followed by Dr. CLayla MawFor this and last  seen on 12/06/2016. At that time patient had PTH of 26 and a calcium 10.6.Patient being treated with high-dose vitamin D. Continue Vit D   Hypercalcemia:Chronic. Mild elevation in calcium to 10.9 on admission. Suspect this could be secondary history of hyperparathyroidism vs. metastic disease.  Stable at 9.2  Endometrium hyperplasia:Acute. Seen on MRI report. Question possible source of cancer. Transvaginal ultrasound with endometrial thickening. Defer to oncology for further recommendations   Hypothyroidism:Patient's last TSH was 1.31 on 12/05/16. Continue levothyroxine  Essential hypertension BP stable Continue to hold on BP meds for now as BP remains stable    All other chronic medical condition were stable during the hospitalization.  Patient was seen by physical therapy, recommending SNF for STR On the day of the discharge the patient's vitals were stable, and no other acute medical condition were reported by patient. The patient was felt safe to be discharge to SNF  Discharge Instructions  You were cared for by a hospitalist during your hospital stay. If you have any questions about your discharge medications or the care you received while you were in the hospital after you are discharged, you can call the unit and asked to speak with the hospitalist on call if the hospitalist that took care of you is not available. Once you are discharged, your primary care physician will handle any further medical issues. Please note that NO REFILLS for any discharge medications will be authorized once you are discharged, as it is imperative that you return to your primary care physician (or establish a relationship with a primary care physician if you do not have one) for your aftercare needs so that they can reassess your need for medications and monitor your lab values.  Discharge Instructions    Call MD for:  difficulty breathing, headache or visual disturbances    Complete by:  As  directed    Call MD for:  extreme fatigue    Complete by:  As directed    Call MD for:  hives    Complete by:  As directed    Call MD for:  persistant dizziness or light-headedness    Complete by:  As directed    Call MD for:  persistant nausea and vomiting    Complete by:  As directed    Call MD for:  redness, tenderness, or signs of infection (pain, swelling, redness, odor or green/yellow discharge around incision site)    Complete by:  As directed    Call MD for:  severe uncontrolled pain    Complete by:  As directed    Call MD for:  temperature >100.4    Complete by:  As directed    Diet - low sodium heart healthy    Complete by:  As directed    Increase activity slowly    Complete by:  As directed      Allergies as of 01/21/2017      Reactions   Anesthetics, Amide Hypertension   Benadryl [diphenhydramine Hcl] Other (See Comments)   Dizziness   Carbocaine [mepivacaine Hcl] Hypertension   Codeine Other (See Comments)   Dizziness   Epinephrine Hypertension   Sulfa Antibiotics Other (See Comments)   dizziness  Tramadol    Sedation.    Penicillins Rash      Medication List    STOP taking these medications   acetaminophen 500 MG tablet Commonly known as:  TYLENOL   Diclofenac Sodium 2 % Soln Commonly known as:  PENNSAID   gabapentin 100 MG capsule Commonly known as:  NEURONTIN   lisinopril 10 MG tablet Commonly known as:  PRINIVIL,ZESTRIL     TAKE these medications   anastrozole 1 MG tablet Commonly known as:  ARIMIDEX Take 1 tablet (1 mg total) by mouth daily.   docusate sodium 100 MG capsule Commonly known as:  COLACE Take 1 capsule (100 mg total) by mouth 2 (two) times daily.   ferrous sulfate 325 (65 FE) MG tablet Take 1 tablet (325 mg total) by mouth 3 (three) times daily after meals.   HYDROcodone-acetaminophen 5-325 MG tablet Commonly known as:  NORCO Take 1-2 tablets by mouth every 6 (six) hours as needed for moderate pain. What changed:   how much to take   levothyroxine 125 MCG tablet Commonly known as:  SYNTHROID Take 1 tablet (125 mcg total) by mouth daily before breakfast. Dispense as written: Synthroid LAST REFILL   methocarbamol 500 MG tablet Commonly known as:  ROBAXIN Take 1 tablet (500 mg total) by mouth every 6 (six) hours as needed for muscle spasms.   polyethylene glycol packet Commonly known as:  MIRALAX / GLYCOLAX Take 17 g by mouth daily as needed for mild constipation.   Vitamin D3 5000 units Tabs Take 5,000 Units by mouth daily.   warfarin 5 MG tablet Commonly known as:  JANTOVEN Take 1.5-2 tablets (7.5-10 mg total) by mouth See admin instructions. TAKE AS DIRECTED BY ANTICOAGULATION CLINIC. 7.5 MG MWF&SUN 10 MG TUE,THURS,SAT What changed:  how much to take  how to take this  when to take this  additional instructions            Discharge Care Instructions        Start     Ordered   01/22/17 0000  anastrozole (ARIMIDEX) 1 MG tablet  Daily     01/21/17 1243   01/21/17 0000  warfarin (JANTOVEN) 5 MG tablet  See admin instructions     01/21/17 1243   01/21/17 0000  docusate sodium (COLACE) 100 MG capsule  2 times daily     01/21/17 1243   01/21/17 0000  ferrous sulfate 325 (65 FE) MG tablet  3 times daily after meals     01/21/17 1243   01/21/17 0000  polyethylene glycol (MIRALAX / GLYCOLAX) packet  Daily PRN     01/21/17 1243   01/21/17 0000  Increase activity slowly     01/21/17 1243   01/21/17 0000  Diet - low sodium heart healthy     01/21/17 1243   01/21/17 0000  Call MD for:  temperature >100.4     01/21/17 1243   01/21/17 0000  Call MD for:  persistant nausea and vomiting     01/21/17 1243   01/21/17 0000  Call MD for:  severe uncontrolled pain     01/21/17 1243   01/21/17 0000  Call MD for:  redness, tenderness, or signs of infection (pain, swelling, redness, odor or green/yellow discharge around incision site)     01/21/17 1243   01/21/17 0000  Call MD for:   difficulty breathing, headache or visual disturbances     01/21/17 1243   01/21/17 0000  Call MD for:  hives  01/21/17 1243   01/21/17 0000  Call MD for:  persistant dizziness or light-headedness     01/21/17 1243   01/21/17 0000  Call MD for:  extreme fatigue     01/21/17 1243   01/16/17 0000  methocarbamol (ROBAXIN) 500 MG tablet  Every 6 hours PRN    Question:  Supervising Provider  Answer:  Paralee Cancel   01/16/17 1653   01/16/17 0000  HYDROcodone-acetaminophen (NORCO) 5-325 MG tablet  Every 6 hours PRN    Question:  Supervising Provider  Answer:  Paralee Cancel   01/16/17 1653      Contact information for follow-up providers    Paralee Cancel, MD. Schedule an appointment as soon as possible for a visit in 2 week(s).   Specialty:  Orthopedic Surgery Contact information: [address] [place] [address] [phone]        Magrinat, [name], MD. Go on 01/26/2017.   Specialty:  Oncology Contact information: [place] [address] [phone]            Contact information for after-discharge care    Destination    [facility] Follow up.   Specialty:  Skilled Nursing Facility Contact information: [place] [address] [phone]                 Allergies  Allergen Reactions  . Anesthetics, Amide Hypertension  . Benadryl [Diphenhydramine Hcl] Other (See Comments)    Dizziness  . Carbocaine [Mepivacaine Hcl] Hypertension  . Codeine Other (See Comments)    Dizziness  . Epinephrine Hypertension  . Sulfa Antibiotics Other (See Comments)    dizziness  . Tramadol     Sedation.   Marland Kitchen Penicillins Rash    Consultations:  Oncology   Orthopedic surgery    Procedures/Studies: Ct Pelvis Wo Contrast  Result Date: 01/19/2017 CLINICAL DATA:  Patient status post left hip replacement 01/16/2017 for a pathologic fracture of the left femoral neck due to metastatic breast  carcinoma. Continued left hip pain. EXAM: CT PELVIS WITHOUT CONTRAST TECHNIQUE: Multidetector CT imaging of the pelvis was performed following the standard protocol without intravenous contrast. COMPARISON:  CT abdomen and pelvis 01/15/2017. Plain films of the pelvis left hip 01/16/2017. FINDINGS: Since the prior CT scan, the patient has undergone left total hip replacement. The device is located. There is a periprosthetic fracture of the left femur. The fracture is nondisplaced and extends through the anterior cortex of the from a point approximately 8 cm below the top of the greater trochanter inferiorly through the lateral cortex approximately 4 cm below the tip of the femoral stem. No other fracture is identified. No focal lesion is seen. Gas in the soft tissues of the anterior and medial compartments of the left thighs presumably postoperative. Gas extends into the short adductor superiorly. There s hematoma in subcutaneous fat of the left buttock most consistent with postoperative change. Lower lumbar spondylosis is noted. Atherosclerotic vascular disease identified. IMPRESSION: Status post left total hip replacement with a nondisplaced periprosthetic fracture extending approximately 8 cm below the top of the greater trochanter to approximately 4 cm below the tip of the femoral stem. Electronically Signed   By: Inge Rise M.D.   On: 01/19/2017 10:55   US Transvaginal Non-ob  Result Date: 01/17/2017 CLINICAL DATA:  Abnormal CT EXAM: TRANSABDOMINAL AND TRANSVAGINAL ULTRASOUND OF PELVIS TECHNIQUE: Both transabdominal and transvaginal ultrasound examinations of the pelvis were performed. Transabdominal technique was performed for global  imaging of the pelvis including uterus, ovaries, adnexal regions, and pelvic cul-de-sac. It was necessary to proceed with endovaginal exam following the transabdominal exam to visualize the uterus, endometrium, ovaries and adnexa . COMPARISON:  CT 01/15/2017 FINDINGS:  Uterus Measurements: 6.2 x 3.3 x 4.6 cm. No fibroids or other mass visualized. Endometrium Thickness: Thickened, measuring 15 mm, heterogeneous appearance. Right ovary Measurements: 1.6 x 1.1 x 1.2 cm. Normal appearance/no adnexal mass. Left ovary Measurements: 2.6 x 1.7 x 1.8 cm. 1.5 x 1.2 x 1.1 cm simple appearing cyst noted in the left ovary. Other findings No abnormal free fluid. IMPRESSION: Endometrial thickness is considered abnormal for an asymptomatic post-menopausal female. Endometrial sampling should be considered to exclude carcinoma. 1.5 cm simple appearing cyst in the left ovary, likely benign. This could be followed with repeat ultrasound in 1 year. Electronically Signed   By: Rolm Baptise M.D.   On: 01/17/2017 08:48   US Pelvis Complete  Result Date: 01/17/2017 CLINICAL DATA:  Abnormal CT EXAM: TRANSABDOMINAL AND TRANSVAGINAL ULTRASOUND OF PELVIS TECHNIQUE: Both transabdominal and transvaginal ultrasound examinations of the pelvis were performed. Transabdominal technique was performed for global imaging of the pelvis including uterus, ovaries, adnexal regions, and pelvic cul-de-sac. It was necessary to proceed with endovaginal exam following the transabdominal exam to visualize the uterus, endometrium, ovaries and adnexa . COMPARISON:  CT 01/15/2017 FINDINGS: Uterus Measurements: 6.2 x 3.3 x 4.6 cm. No fibroids or other mass visualized. Endometrium Thickness: Thickened, measuring 15 mm, heterogeneous appearance. Right ovary Measurements: 1.6 x 1.1 x 1.2 cm. Normal appearance/no adnexal mass. Left ovary Measurements: 2.6 x 1.7 x 1.8 cm. 1.5 x 1.2 x 1.1 cm simple appearing cyst noted in the left ovary. Other findings No abnormal free fluid. IMPRESSION: Endometrial thickness is considered abnormal for an asymptomatic post-menopausal female. Endometrial sampling should be considered to exclude carcinoma. 1.5 cm simple appearing cyst in the left ovary, likely benign. This could be followed with repeat  ultrasound in 1 year. Electronically Signed   By: Rolm Baptise M.D.   On: 01/17/2017 08:48   Ct Abdomen Pelvis W Contrast  Result Date: 01/15/2017 CLINICAL DATA:  Stated history of pelvic mass.  Left hip pain. EXAM: CT ABDOMEN AND PELVIS WITH CONTRAST TECHNIQUE: Multidetector CT imaging of the abdomen and pelvis was performed using the standard protocol following bolus administration of intravenous contrast. CONTRAST:  100 cc Isovue-300 IV COMPARISON:  None. FINDINGS: Lower chest: Calcified granuloma in the right lower lobe. No consolidation or pleural fluid. Mild distal esophageal wall thickening. Hepatobiliary: No focal liver abnormality is seen. No gallstones, gallbladder wall thickening, or biliary dilatation. Pancreas: No ductal dilatation or inflammation. Spleen: Scattered granuloma.  Normal in size. Adrenals/Urinary Tract: Minimal thickening of the left adrenal gland without discrete nodule. Right adrenal gland is normal. No hydronephrosis or perinephric edema. No focal renal mass. Homogeneous enhancement with symmetric excretion on delayed phase imaging. Urinary bladder is minimally distended without wall thickening. Stomach/Bowel: Thickening of the distal esophagus. Stomach is within normal limits. Appendix appears normal. No evidence of bowel wall thickening, distention, or inflammatory changes. Colonic diverticulosis without acute diverticulitis. Vascular/Lymphatic: Aortic atherosclerosis without aneurysm. Subcentimeter porta hepatis node. No retroperitoneal or mesenteric adenopathy. No pelvic adenopathy. Reproductive: Question of endometrial thickening, 14-16 mm. 14 mm left ovarian cyst. Right ovary is tentatively identified and quiescent. Other: No ascites. No free air. Small fat containing umbilical hernia. No subcutaneous soft tissue mass. Musculoskeletal: Lytic lesion in the left femoral neck measures at least 6.1 cm with marked  thinning of the femoral cortex. Pathologic fracture through the  femoral neck. Minimal T12 superior endplate compression fracture, age indeterminate. No evidence of underlying focal lesion on CT. Multilevel degenerative disc disease and facet arthropathy of the lumbar spine. No evidence of additional focal bone lesion. IMPRESSION: 1. Lytic lesion in the left proximal femur with pathologic femoral neck fracture. This may be secondary to metastatic disease versus multiple myeloma/plasmacytoma in a patient of this age. 2. Probable endometrial thickening, abnormal for a postmenopausal patient. This may be endometrial neoplasm. Consider pelvic ultrasound for more detailed endometrial evaluation. Left ovarian cyst measuring 14 mm. Recommend yearly sonographic follow-up. 3. Thickening of the distal esophagus may be secondary to reflux or esophagitis. 4. Colonic diverticulosis without diverticulitis. Aortic Atherosclerosis (ICD10-I70.0). 5. Sequela of prior granulomatous disease with calcified right lung base nodule and splenic calcifications. Electronically Signed   By: Jeb Levering M.D.   On: 01/15/2017 22:38   Chest Portable 1 View  Result Date: 01/16/2017 CLINICAL DATA:  Shortness of breath. History of breast cancer, pneumonia. EXAM: PORTABLE CHEST 1 VIEW COMPARISON:  Chest radiograph June 14, 2016 FINDINGS: Cardiomediastinal silhouette is normal. No pleural effusions or focal consolidations. Mild pulmonary vascular congestion. Trachea projects midline and there is no pneumothorax. Soft tissue planes and included osseous structures are non-suspicious. Surgical clips RIGHT chest wall. Faint calcifications in neck are likely vascular. IMPRESSION: Mild pulmonary vascular congestion. Electronically Signed   By: Elon Alas M.D.   On: 01/16/2017 00:49   Dg Hip Port Unilat With Pelvis 1v Left  Result Date: 01/16/2017 CLINICAL DATA:  Status post left hip replacement. EXAM: DG HIP (WITH OR WITHOUT PELVIS) 1V PORT LEFT COMPARISON:  None. FINDINGS: The patient is status  post left hip replacement. Acetabular and femoral components are in good position. Postoperative air seen in the soft tissues. IMPRESSION: Left hip replacement as above. Electronically Signed   By: Dorise Bullion III M.D   On: 01/16/2017 19:52    Discharge Exam: Vitals:   01/20/17 2137 01/21/17 0600  BP: (!) 111/50 139/64  Pulse: 95 96  Resp: 18 18  Temp: 98.2 F (36.8 C) 98.2 F (36.8 C)  SpO2: 97% 97%   Vitals:   01/20/17 0708 01/20/17 1422 01/20/17 2137 01/21/17 0600  BP: (!) 121/50 (!) 130/49 (!) 111/50 139/64  Pulse: 89  95 96  Resp: '18  18 18  ' Temp: 97.9 F (36.6 C) 98 F (36.7 C) 98.2 F (36.8 C) 98.2 F (36.8 C)  TempSrc: Oral Oral Oral Oral  SpO2: 96% 95% 97% 97%  Weight:      Height:        General: Pt is alert, awake, not in acute distress Cardiovascular: RRR, S1/S2 +, no rubs, no gallops Respiratory: CTA bilaterally, no wheezing, no rhonchi Abdominal: Soft, NT, ND, bowel sounds + Extremities: Dressing clean and intact    The results of significant diagnostics from this hospitalization (including imaging, microbiology, ancillary and laboratory) are listed below for reference.     Microbiology: Recent Results (from the past 240 hour(s))  Urine culture     Status: Abnormal   Collection Time: 01/15/17  8:33 PM  Result Value Ref Range Status   Specimen Description URINE, RANDOM  Final   Special Requests NONE  Final   Culture MULTIPLE SPECIES PRESENT, SUGGEST RECOLLECTION (A)  Final   Report Status 01/18/2017 FINAL  Final  Surgical pcr screen     Status: None   Collection Time: 01/16/17  2:00 PM  Result  Value Ref Range Status   MRSA, PCR NEGATIVE NEGATIVE Final   Staphylococcus aureus NEGATIVE NEGATIVE Final    Comment:        The Xpert SA Assay (FDA approved for NASAL specimens in patients over 72 years of age), is one component of a comprehensive surveillance program.  Test performance has been validated by [facility] for patients greater than  or equal to 39 year old. It is not intended to diagnose infection nor to guide or monitor treatment.      Labs: BNP (last 3 results) No results for input(s): BNP in the last 8760 hours. Basic Metabolic Panel:  Recent Labs Lab 01/16/17 0132 01/16/17 1846 01/17/17 0259 01/18/17 0410 01/19/17 0427 01/20/17 0353  NA 142 142 140 138 137 136  K 3.9 3.9 4.2 4.4 4.0 4.1  CL 109  --  110 108 106 107  CO2 27  --  '23 25 25 25  ' GLUCOSE 107* 120* 179* 119* 102* 113*  BUN 16  --  '14 18 11 16  ' CREATININE 0.78  --  0.73 0.65 0.52 0.54  CALCIUM 10.3  --  9.4 9.3 9.2 9.2   Liver Function Tests: No results for input(s): AST, ALT, ALKPHOS, BILITOT, PROT, ALBUMIN in the last 168 hours. No results for input(s): LIPASE, AMYLASE in the last 168 hours. No results for input(s): AMMONIA in the last 168 hours. CBC:  Recent Labs Lab 01/15/17 2000  01/17/17 0259 01/18/17 0410 01/19/17 0427 01/20/17 0353 01/21/17 0349  WBC 8.1  < > 10.2 11.3* 9.2 8.3 7.7  NEUTROABS 5.0  --   --   --   --  5.5  --   HGB 13.4  < > 8.8* 9.0* 7.6* 8.8* 8.8*  HCT 41.0  < > 26.4* 26.3* 22.6* 25.9* 26.4*  MCV 93.4  < > 94.6 92.0 92.6 92.2 93.6  PLT 222  < > 187 145* 158 170 208  < > = values in this interval not displayed. Cardiac Enzymes:  Recent Labs Lab 01/16/17 0132 01/16/17 1101 01/16/17 2106 01/17/17 0259  TROPONINI <0.03 <0.03 <0.03 <0.03   BNP: Invalid input(s): POCBNP CBG: No results for input(s): GLUCAP in the last 168 hours. D-Dimer No results for input(s): DDIMER in the last 72 hours. Hgb A1c No results for input(s): HGBA1C in the last 72 hours. Lipid Profile No results for input(s): CHOL, HDL, LDLCALC, TRIG, CHOLHDL, LDLDIRECT in the last 72 hours. Thyroid function studies No results for input(s): TSH, T4TOTAL, T3FREE, THYROIDAB in the last 72 hours.  Invalid input(s): FREET3 Anemia work up No results for input(s): VITAMINB12, FOLATE, FERRITIN, TIBC, IRON, RETICCTPCT in the last 72  hours. Urinalysis    Component Value Date/Time   COLORURINE STRAW (A) 01/15/2017 2000   APPEARANCEUR CLEAR 01/15/2017 2000   LABSPEC 1.008 01/15/2017 2000   LABSPEC 1.010 09/22/2016 1553   PHURINE 6.0 01/15/2017 2000   GLUCOSEU NEGATIVE 01/15/2017 2000   GLUCOSEU Negative 09/22/2016 1553   HGBUR SMALL (A) 01/15/2017 2000   BILIRUBINUR NEGATIVE 01/15/2017 2000   BILIRUBINUR Negative 01/06/2017 1608   BILIRUBINUR Negative 09/22/2016 1553   KETONESUR NEGATIVE 01/15/2017 2000   PROTEINUR NEGATIVE 01/15/2017 2000   UROBILINOGEN 0.2 01/06/2017 1608   UROBILINOGEN 0.2 11/15/2016 1358   UROBILINOGEN 0.2 09/22/2016 1553   NITRITE NEGATIVE 01/15/2017 2000   LEUKOCYTESUR NEGATIVE 01/15/2017 2000   LEUKOCYTESUR Negative 09/22/2016 1553   Sepsis Labs Invalid input(s): PROCALCITONIN,  WBC,  LACTICIDVEN Microbiology Recent Results (from the past 240 hour(s))  Urine culture     Status: Abnormal   Collection Time: 01/15/17  8:33 PM  Result Value Ref Range Status   Specimen Description URINE, RANDOM  Final   Special Requests NONE  Final   Culture MULTIPLE SPECIES PRESENT, SUGGEST RECOLLECTION (A)  Final   Report Status 01/18/2017 FINAL  Final  Surgical pcr screen     Status: None   Collection Time: 01/16/17  2:00 PM  Result Value Ref Range Status   MRSA, PCR NEGATIVE NEGATIVE Final   Staphylococcus aureus NEGATIVE NEGATIVE Final    Comment:        The Xpert SA Assay (FDA approved for NASAL specimens in patients over 108 years of age), is one component of a comprehensive surveillance program.  Test performance has been validated by [facility] for patients greater than or equal to 56 year old. It is not intended to diagnose infection nor to guide or monitor treatment.      Time coordinating discharge: 36 minutes  SIGNED:  Chipper Oman, MD  [facility] 01/21/2017, 12:43 PM  Pager please text page via  [email or web address] Password [email or web address]

## 2017-01-21 NOTE — Progress Notes (Signed)
Alicia Vasquez   DOB:06-Sep-1940   BD#:[id number]   STM#:[id number]  Subjective: very anxious about discharge. Started anastrozole last night and had hot flashes. No BM in several days. Discussed results of pelvic US. No family in room  Objective: Elderly white woman examined in bed Vitals:   01/20/17 2137 01/21/17 0600  BP: (!) 111/50 139/64  Pulse: 95 96  Resp: 18 18  Temp: 98.2 F (36.8 C) 98.2 F (36.8 C)  SpO2: 97% 97%    Body mass index is 35.19 kg/m.  Intake/Output Summary (Last 24 hours) at 01/21/17 1006 Last data filed at 01/21/17 0937  Gross per 24 hour  Intake              920 ml  Output              850 ml  Net               70 ml     CBG (last 3)  No results for input(s): GLUCAP in the last 72 hours.   Labs:  Lab Results  Component Value Date   WBC 7.7 01/21/2017   HGB 8.8 (L) 01/21/2017   HCT 26.4 (L) 01/21/2017   MCV 93.6 01/21/2017   PLT 208 01/21/2017   NEUTROABS 5.5 01/20/2017    '@LASTCHEMISTRY' @  Urine Studies No results for input(s): UHGB, CRYS in the last 72 hours.  Invalid input(s): UACOL, UAPR, USPG, UPH, UTP, UGL, UKET, UBIL, UNIT, UROB, [place], UEPI, UWBC, Junie Panning [place], [place], [place]  Basic Metabolic Panel:  Recent Labs Lab 01/16/17 0132 01/16/17 1846 01/17/17 0259 01/18/17 0410 01/19/17 0427 01/20/17 0353  NA 142 142 140 138 137 136  K 3.9 3.9 4.2 4.4 4.0 4.1  CL 109  --  110 108 106 107  CO2 27  --  '23 25 25 25  ' GLUCOSE 107* 120* 179* 119* 102* 113*  BUN 16  --  '14 18 11 16  ' CREATININE 0.78  --  0.73 0.65 0.52 0.54  CALCIUM 10.3  --  9.4 9.3 9.2 9.2   GFR Estimated Creatinine Clearance: 65.1 mL/min (by C-G formula based on SCr of 0.54 mg/dL). Liver Function Tests: No results for input(s): AST, ALT, ALKPHOS, BILITOT, PROT, ALBUMIN in the last 168 hours. No results for input(s): LIPASE, AMYLASE in the last 168 hours. No results for input(s): AMMONIA in the last 168 hours. Coagulation profile  Recent Labs Lab  01/17/17 1111 01/18/17 0410 01/19/17 0427 01/20/17 0353 01/21/17 0349  INR 1.62 1.32 1.21 1.26 1.39    CBC:  Recent Labs Lab 01/15/17 2000  01/17/17 0259 01/18/17 0410 01/19/17 0427 01/20/17 0353 01/21/17 0349  WBC 8.1  < > 10.2 11.3* 9.2 8.3 7.7  NEUTROABS 5.0  --   --   --   --  5.5  --   HGB 13.4  < > 8.8* 9.0* 7.6* 8.8* 8.8*  HCT 41.0  < > 26.4* 26.3* 22.6* 25.9* 26.4*  MCV 93.4  < > 94.6 92.0 92.6 92.2 93.6  PLT 222  < > 187 145* 158 170 208  < > = values in this interval not displayed. Cardiac Enzymes:  Recent Labs Lab 01/16/17 0132 01/16/17 1101 01/16/17 2106 01/17/17 0259  TROPONINI <0.03 <0.03 <0.03 <0.03   BNP: Invalid input(s): POCBNP CBG: No results for input(s): GLUCAP in the last 168 hours. D-Dimer No results for input(s): DDIMER in the last 72 hours. Hgb A1c No results for input(s): HGBA1C in the last 72  hours. Lipid Profile No results for input(s): CHOL, HDL, LDLCALC, TRIG, CHOLHDL, LDLDIRECT in the last 72 hours. Thyroid function studies No results for input(s): TSH, T4TOTAL, T3FREE, THYROIDAB in the last 72 hours.  Invalid input(s): FREET3 Anemia work up No results for input(s): VITAMINB12, FOLATE, FERRITIN, TIBC, IRON, RETICCTPCT in the last 72 hours. Microbiology Recent Results (from the past 240 hour(s))  Urine culture     Status: Abnormal   Collection Time: 01/15/17  8:33 PM  Result Value Ref Range Status   Specimen Description URINE, RANDOM  Final   Special Requests NONE  Final   Culture MULTIPLE SPECIES PRESENT, SUGGEST RECOLLECTION (A)  Final   Report Status 01/18/2017 FINAL  Final  Surgical pcr screen     Status: None   Collection Time: 01/16/17  2:00 PM  Result Value Ref Range Status   MRSA, PCR NEGATIVE NEGATIVE Final   Staphylococcus aureus NEGATIVE NEGATIVE Final    Comment:        The Xpert SA Assay (FDA approved for NASAL specimens in patients over 12 years of age), is one component of a comprehensive  surveillance program.  Test performance has been validated by [facility] for patients greater than or equal to 59 year old. It is not intended to diagnose infection nor to guide or monitor treatment.       Studies:  Ct Pelvis Wo Contrast  Result Date: 01/19/2017 CLINICAL DATA:  Patient status post left hip replacement 01/16/2017 for a pathologic fracture of the left femoral neck due to metastatic breast carcinoma. Continued left hip pain. EXAM: CT PELVIS WITHOUT CONTRAST TECHNIQUE: Multidetector CT imaging of the pelvis was performed following the standard protocol without intravenous contrast. COMPARISON:  CT abdomen and pelvis 01/15/2017. Plain films of the pelvis left hip 01/16/2017. FINDINGS: Since the prior CT scan, the patient has undergone left total hip replacement. The device is located. There is a periprosthetic fracture of the left femur. The fracture is nondisplaced and extends through the anterior cortex of the from a point approximately 8 cm below the top of the greater trochanter inferiorly through the lateral cortex approximately 4 cm below the tip of the femoral stem. No other fracture is identified. No focal lesion is seen. Gas in the soft tissues of the anterior and medial compartments of the left thighs presumably postoperative. Gas extends into the short adductor superiorly. There s hematoma in subcutaneous fat of the left buttock most consistent with postoperative change. Lower lumbar spondylosis is noted. Atherosclerotic vascular disease identified. IMPRESSION: Status post left total hip replacement with a nondisplaced periprosthetic fracture extending approximately 8 cm below the top of the greater trochanter to approximately 4 cm below the tip of the femoral stem. Electronically Signed   By: Inge Rise M.D.   On: 01/19/2017 10:55    Assessment: 76 y.o. 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 It also predicts no benefit from chemotherapy  (3) adjuvant radiation completed 01/07/2014  (4) anastrozole started 02/27/2014 stopped within 2 weeks because of arm swelling.  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 They frequently abate over time.  I have written for f/u in our office next 08/30 and will start fulvestrant then. We will have to hold off on denosumab as her teeth are not in good condition and she likely will need further extractions. I also requested visit with radiation oncology same day  Will sign off at this point. Please let me know if I can be of further assistance.    Chauncey Cruel, MD 01/21/2017  10:06 AM Medical Oncology and Hematology [facility] [address] [place], [place] [address] Tel. [phone]    Fax. [phone]

## 2017-01-21 NOTE — Progress Notes (Signed)
ANTICOAGULATION CONSULT NOTE - Follow Up Consult  Pharmacy Consult for warfarin Indication: h/o DVT  Allergies  Allergen Reactions  . Anesthetics, Amide Hypertension  . Benadryl [Diphenhydramine Hcl] Other (See Comments)    Dizziness  . Carbocaine [Mepivacaine Hcl] Hypertension  . Codeine Other (See Comments)    Dizziness  . Epinephrine Hypertension  . Sulfa Antibiotics Other (See Comments)    dizziness  . Tramadol     Sedation.   Marland Kitchen Penicillins Rash    Patient Measurements: Height: 5\' 4"  (162.6 cm) Weight: 205 lb (93 kg) IBW/kg (Calculated) : 54.7  Vital Signs: Temp: 98.2 F (36.8 C) (08/25 0600) Temp Source: Oral (08/25 0600) BP: 139/64 (08/25 0600) Pulse Rate: 96 (08/25 0600)  Labs:  Recent Labs  01/19/17 0427 01/20/17 0353 01/21/17 0349  HGB 7.6* 8.8* 8.8*  HCT 22.6* 25.9* 26.4*  PLT 158 170 208  LABPROT 15.3* 15.9* 17.2*  INR 1.21 1.26 1.39  CREATININE 0.52 0.54  --     Estimated Creatinine Clearance: 65.1 mL/min (by C-G formula based on SCr of 0.54 mg/dL).   Medical History: Past Medical History:  Diagnosis Date  . Allergy   . Anxiety   . Arthritis   . Blood transfusion without reported diagnosis   . Breast cancer ([place]) 07/12/2013   Invasive Mammary Carcinoma  . DVT (deep vein thrombosis) in pregnancy ([place])   . Hypertension   . Hypothyroid   . Personal history of radiation therapy   . Pneumonia   . Radiation 11/21/13-01/07/14   Right Breast/Supraclavicular     Assessment: 58 yoF on chronic warfarin therapy for history of DVT in 2014, history of breast cancer admitted with hip fracture. Warfarin was held (last dose 8/18) and patient started on Lovenox bridge on admission.  Patient underwent left THA 8/20 afternoon.  Last dose of Lovenox 1 mg/kg given early AM of 8/20 and INR 1.53 the morning of surgery.  Due to ABLA during procedure, postop anticoagulation is to be delayed until 8/22 with warfarin per ortho instruction.  TRH consulted pharmacy  to manage. TRH discussed Lovenox bridge with Ortho who recommended not starting Lovenox  PTA warfarin regimen reported as 7.5 mg daily except 10 mg on Tues/Thurs/Saturdays.  INR was subtherapeutic on admission at 1.62.  Today, 01/21/2017: - INR subtherapeutic this AM but rose slightly after dose of warfarin of 7.5mg  x 2 and then 10mg  x 1 since surgery so far as inpatient - Hgb improved s/p blood 8/23  Goal of Therapy:  INR 2-3   Plan:  1) Repeat warfarin 10mg  today 2) Daily INR.   Adrian Saran, PharmD, BCPS Pager [phone] 01/21/2017 10:43 AM

## 2017-01-21 NOTE — Clinical Social Work Placement (Signed)
   CLINICAL SOCIAL WORK PLACEMENT  NOTE 01/21/17 - DISCHARGED TO [facility] TRANSPORTED BY AMBULANCE  Date:  01/21/2017  Patient Details  Name: [name] MRN: [id number] Date of Birth: September 18, 1940  Clinical Social Work is seeking post-discharge placement for this patient at the [place] level of care (*CSW will initial, date and re-position this form in  chart as items are completed):  Yes   Patient/family provided with [place] Work Department's list of facilities offering this level of care within the geographic area requested by the patient (or if unable, by the patient's family).  Yes   Patient/family informed of their freedom to choose among providers that offer the needed level of care, that participate in Medicare, Medicaid or managed care program needed by the patient, have an available bed and are willing to accept the patient.  Yes   Patient/family informed of [facility]'s ownership interest in [facility] and [facility], as well as of the fact that they are under no obligation to receive care at these facilities.  PASRR submitted to EDS on 01/17/17     PASRR number received on 01/17/17     Existing PASRR number confirmed on       FL2 transmitted to all facilities in geographic area requested by pt/family on 01/17/17     FL2 transmitted to all facilities within larger geographic area on       Patient informed that his/her managed care company has contracts with or will negotiate with certain facilities, including the following:        Yes   Patient/family informed of bed offers received.  Patient chooses bed at [facility]     Physician recommends and patient chooses bed at      Patient to be transferred to [facility] on 01/21/17.  Patient to be transferred to facility by Ambulance     Patient family notified on 01/21/17 of transfer.  Name of family member notified:  [name] at the bedside      PHYSICIAN       Additional Comment:    _______________________________________________ Sable Feil, LCSW 01/21/2017, 5:30 PM

## 2017-01-21 NOTE — Progress Notes (Signed)
Report called to [place] porter, Therapist, sports at  [facility].  Barbee [place]. Brigitte Pulse, RN

## 2017-01-24 ENCOUNTER — Encounter: Payer: Medicare Other | Admitting: Physical Therapy

## 2017-01-24 DIAGNOSIS — M6281 Muscle weakness (generalized): Secondary | ICD-10-CM | POA: Diagnosis not present

## 2017-01-24 DIAGNOSIS — Z96642 Presence of left artificial hip joint: Secondary | ICD-10-CM | POA: Diagnosis not present

## 2017-01-24 DIAGNOSIS — R262 Difficulty in walking, not elsewhere classified: Secondary | ICD-10-CM | POA: Diagnosis not present

## 2017-01-24 DIAGNOSIS — M25552 Pain in left hip: Secondary | ICD-10-CM | POA: Diagnosis not present

## 2017-01-24 DIAGNOSIS — C4090 Malignant neoplasm of unspecified bones and articular cartilage of unspecified limb: Secondary | ICD-10-CM | POA: Diagnosis not present

## 2017-01-24 DIAGNOSIS — C799 Secondary malignant neoplasm of unspecified site: Secondary | ICD-10-CM | POA: Diagnosis not present

## 2017-01-24 DIAGNOSIS — Z853 Personal history of malignant neoplasm of breast: Secondary | ICD-10-CM | POA: Diagnosis not present

## 2017-01-24 DIAGNOSIS — R2681 Unsteadiness on feet: Secondary | ICD-10-CM | POA: Diagnosis not present

## 2017-01-25 ENCOUNTER — Ambulatory Visit: Payer: Medicare Other

## 2017-01-25 ENCOUNTER — Other Ambulatory Visit: Payer: Self-pay | Admitting: *Deleted

## 2017-01-25 ENCOUNTER — Other Ambulatory Visit (HOSPITAL_BASED_OUTPATIENT_CLINIC_OR_DEPARTMENT_OTHER): Payer: Medicare Other

## 2017-01-25 ENCOUNTER — Ambulatory Visit (HOSPITAL_BASED_OUTPATIENT_CLINIC_OR_DEPARTMENT_OTHER): Payer: Medicare Other | Admitting: Oncology

## 2017-01-25 VITALS — BP 127/61 | HR 84 | Temp 98.1°F | Resp 18 | Ht 64.0 in | Wt 205.0 lb

## 2017-01-25 DIAGNOSIS — I82403 Acute embolism and thrombosis of unspecified deep veins of lower extremity, bilateral: Secondary | ICD-10-CM

## 2017-01-25 DIAGNOSIS — R262 Difficulty in walking, not elsewhere classified: Secondary | ICD-10-CM | POA: Diagnosis not present

## 2017-01-25 DIAGNOSIS — C50411 Malignant neoplasm of upper-outer quadrant of right female breast: Secondary | ICD-10-CM

## 2017-01-25 DIAGNOSIS — Z170 Estrogen receptor positive status [ER+]: Principal | ICD-10-CM

## 2017-01-25 DIAGNOSIS — C7951 Secondary malignant neoplasm of bone: Secondary | ICD-10-CM | POA: Insufficient documentation

## 2017-01-25 DIAGNOSIS — C4090 Malignant neoplasm of unspecified bones and articular cartilage of unspecified limb: Secondary | ICD-10-CM | POA: Diagnosis not present

## 2017-01-25 DIAGNOSIS — C799 Secondary malignant neoplasm of unspecified site: Secondary | ICD-10-CM | POA: Diagnosis not present

## 2017-01-25 DIAGNOSIS — M25552 Pain in left hip: Secondary | ICD-10-CM | POA: Diagnosis not present

## 2017-01-25 DIAGNOSIS — G893 Neoplasm related pain (acute) (chronic): Secondary | ICD-10-CM | POA: Diagnosis not present

## 2017-01-25 DIAGNOSIS — M6281 Muscle weakness (generalized): Secondary | ICD-10-CM | POA: Diagnosis not present

## 2017-01-25 DIAGNOSIS — Z96642 Presence of left artificial hip joint: Secondary | ICD-10-CM | POA: Diagnosis not present

## 2017-01-25 DIAGNOSIS — Z853 Personal history of malignant neoplasm of breast: Secondary | ICD-10-CM | POA: Diagnosis not present

## 2017-01-25 DIAGNOSIS — R2681 Unsteadiness on feet: Secondary | ICD-10-CM | POA: Diagnosis not present

## 2017-01-25 LAB — COMPREHENSIVE METABOLIC PANEL
ALBUMIN: 2.9 g/dL — AB (ref 3.5–5.0)
ALT: 30 U/L (ref 0–55)
AST: 16 U/L (ref 5–34)
Alkaline Phosphatase: 96 U/L (ref 40–150)
Anion Gap: 5 mEq/L (ref 3–11)
BUN: 19.1 mg/dL (ref 7.0–26.0)
CO2: 28 meq/L (ref 22–29)
CREATININE: 0.9 mg/dL (ref 0.6–1.1)
Calcium: 10.8 mg/dL — ABNORMAL HIGH (ref 8.4–10.4)
Chloride: 104 mEq/L (ref 98–109)
EGFR: 58 mL/min/{1.73_m2} — AB (ref >90)
GLUCOSE: 143 mg/dL — AB (ref 70–140)
Potassium: 4.1 mEq/L (ref 3.5–5.1)
SODIUM: 137 meq/L (ref 136–145)
TOTAL PROTEIN: 6.3 g/dL — AB (ref 6.4–8.3)
Total Bilirubin: 0.48 mg/dL (ref 0.20–1.20)

## 2017-01-25 LAB — CBC WITH DIFFERENTIAL/PLATELET
BASO%: 0.4 % (ref 0.0–2.0)
Basophils Absolute: 0 10*3/uL (ref 0.0–0.1)
EOS ABS: 0.2 10*3/uL (ref 0.0–0.5)
EOS%: 1.4 % (ref 0.0–7.0)
HCT: 32.1 % — ABNORMAL LOW (ref 34.8–46.6)
HEMOGLOBIN: 10.2 g/dL — AB (ref 11.6–15.9)
LYMPH%: 11.5 % — ABNORMAL LOW (ref 14.0–49.7)
MCH: 30.4 pg (ref 25.1–34.0)
MCHC: 31.8 g/dL (ref 31.5–36.0)
MCV: 95.8 fL (ref 79.5–101.0)
MONO#: 1.1 10*3/uL — AB (ref 0.1–0.9)
MONO%: 9.9 % (ref 0.0–14.0)
NEUT%: 76.8 % (ref 38.4–76.8)
NEUTROS ABS: 8.3 10*3/uL — AB (ref 1.5–6.5)
Platelets: 337 10*3/uL (ref 145–400)
RBC: 3.35 10*6/uL — AB (ref 3.70–5.45)
RDW: 14.8 % — AB (ref 11.2–14.5)
WBC: 10.8 10*3/uL — AB (ref 3.9–10.3)
lymph#: 1.2 10*3/uL (ref 0.9–3.3)

## 2017-01-25 NOTE — Progress Notes (Signed)
[place]  [phone] [phone]) [phone]     ID: Alicia Vasquez OB: Mar 28, 1941  MR#: [id number]  YOV#:[id number]  PCP: Briscoe Deutscher, DO GYN:   SU: [name] OTHER MD: Thea Silversmith, Hart Robinsons, Philemon Kingdom  CHIEF COMPLAINT: Estrogen receptor positive breast cancer  CURRENT TREATMENT:  To start fulvestrant, palbociclib   INTERVAL HISTORY: Alicia Vasquez returns today for follow-up and treatment of her estrogen receptor positive breast cancer accompanied by her friend Alicia Vasquez. Since her last visit here Alicia Vasquez was evaluated for left hip pain with plain x-rays showing a lucency in that area and then an MRI which showed a large fluid based area of signal in the left proximal femoral neck intertrochanteric area. On 01/15/2017 she presented to the emergency room with a nondisplaced fracture of the femoral neck and she underwent left total hip arthroplasty under Adriana Mccallum on 01/16/2017. The final pathology from this procedure [facility] [id number]) showed metastatic ductal carcinoma of the breast, estrogen receptor positive, E-cadherin positive.   Recent studies include bilateral mammography with tomography 09/20/2016, which showed no evidence of malignancy, a CT scan of the abdomen and pelvis 01/15/2017 which showed the lytic lesion in the left femoral neck, but no other bone lesions, no evidence of liver involvement, and no evidence of metastatic adenopathy. Incidental thickening of the endometrium was noted. There was a left ovarian cyst measuring 1.4 cm. This was further evaluated with a transabdominal and transvaginal ultrasonography. This found endometrial thickness to be 1.5 cm and heterogeneous. The left ovarian cyst was simple. A portable chest x-ray 01/16/2017 found no effusions or obvious metastases.  Alicia Vasquez is here today to discuss those results.  REVIEW OF SYSTEMS: Alicia Vasquez is currently living in [facility]. She likes the food but has had some  problems with the rehabilitation staff and she finds very rough. Her pain is only moderately well controlled. She tells me she has spent the whole day today in her wheelchair. She is having regular bowel movements. She has some numbness in the left leg from the knee down. She has had no overt bleeding. She does not know her current INR but she tells me they take it just about every other day. She denies fevers or drenching sweats. A detailed review of systems was otherwise stable   BREAST CANCER HISTORY: From doctor [name]'s intake node 07/24/2013:  "76 y.o. female. Who presented with SOB and had a CT chest performed that revealed a right breast mass. Mammogram/ultrsound on 2/13 showed a mass in the 11 o'clock position in the right breast measuring 1.5 cm. Also noted was a right axillary LN measuring 1.6 cm. MRI not performed. Biopsy of mass and lymph done. 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 Lymph node + for metastatic carcinoma."  [On 09/09/2013 the patient underwent right lumpectomy and sentinel lymph node sampling. This showed ([id number]) multifocal invasive ductal carcinoma, grade 1, the largest lesion measuring 1.8 cm, the second lesion 1.2 cm. One of 4 sentinel lymph nodes was positive, with extracapsular extension. Margins were positive. HER-2 was repeated and was again negative. Further surgery 09/16/2013 obtained clear margins.  Her subsequent history is as detailed below  PAST MEDICAL HISTORY: Past Medical History:  Diagnosis Date  . Allergy   . Anxiety   . Arthritis   . Blood transfusion without reported diagnosis   . Breast cancer ([place]) 07/12/2013  Invasive Mammary Carcinoma  . DVT (deep vein thrombosis) in pregnancy ([place])   . Hypertension   . Hypothyroid   . Personal history of radiation therapy   . Pneumonia   . Radiation 11/21/13-01/07/14   Right  Breast/Supraclavicular    PAST SURGICAL HISTORY: Past Surgical History:  Procedure Laterality Date  . BREAST LUMPECTOMY WITH RADIOACTIVE SEED LOCALIZATION Right 09/09/2013   Procedure: BREAST LUMPECTOMY WITH RADIOACTIVE SEED LOCALIZATION WITH AXILLARY NODE EXCISION;  Surgeon: [name], MD;  Location: [place];  Service: General;  Laterality: Right;  . DENTAL SURGERY  04/19/2012   13 TEETH REMOVED  . DILATION AND CURETTAGE OF UTERUS    . RE-EXCISION OF BREAST LUMPECTOMY Right 09/24/2013   Procedure: RE-EXCISION OF RIGHT BREAST LUMPECTOMY;  Surgeon: [name], MD;  Location: [place];  Service: General;  Laterality: Right;  . TOTAL HIP ARTHROPLASTY Left 01/16/2017   Procedure: TOTAL HIP ARTHROPLASTY POSTERIOR;  Surgeon: Paralee Cancel, MD;  Location: [facility];  Service: Orthopedics;  Laterality: Left;    FAMILY HISTORY Family History  Problem Relation Age of Onset  . Heart disease Brother   . Colon cancer Brother   . Prostate cancer Brother    the patient's father died at the age of 58 after an automobile accident. The patient's mother died at the age of 60. She was a Marine scientist here in [name] in the old [facility]. She was infected with polio and was confined to a wheelchair for a good part of her life. She eventually died of pneumonia. The patient had one brother, who died with prostate cancer. She had no sisters. There is no history of breast or ovarian cancer in the family.  GYNECOLOGIC HISTORY:  Menarche age 70, first live birth age 48, the patient is GX P1. She went through the change of life at age 60. 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 She is widowed. Currently she is staying with her friend Alicia Vasquez,  53,who is a retired Radio producer. The patient's son Alicia Vasquez lives in [place]. He works an Engineer, technical sales. The patient has no grandchildren. She is a  Tourist information centre manager but currently attends a [employer] with her friend Alicia Vasquez    ADVANCED DIRECTIVES: Not in place   HEALTH MAINTENANCE: Social History  Substance Use Topics  . Smoking status: Never Smoker  . Smokeless tobacco: Never Used  . Alcohol use No     Colonoscopy: Never  PAP:  Bone density: 10/01/2009; lowest T score -0.8  Lipid panel:  Allergies  Allergen Reactions  . Anesthetics, Amide Hypertension  . Benadryl [Diphenhydramine Hcl] Other (See Comments)    Dizziness  . Carbocaine [Mepivacaine Hcl] Hypertension  . Codeine Other (See Comments)    Dizziness  . Epinephrine Hypertension  . Sulfa Antibiotics Other (See Comments)    dizziness  . Tramadol     Sedation.   Marland Kitchen Penicillins Rash    Current Outpatient Prescriptions  Medication Sig Dispense Refill  . anastrozole (ARIMIDEX) 1 MG tablet Take 1 tablet (1 mg total) by mouth daily. 30 tablet 0  . Cholecalciferol (VITAMIN D3) 5000 UNITS TABS Take 5,000 Units by mouth daily.     Marland Kitchen docusate sodium (COLACE) 100 MG capsule Take 1 capsule (100 mg total) by mouth 2 (two) times daily. 10 capsule 0  . ferrous sulfate 325 (65 FE) MG tablet Take 1 tablet (325 mg total) by mouth 3 (three)  times daily after meals. 30 tablet 0  . HYDROcodone-acetaminophen (NORCO) 5-325 MG tablet Take 1-2 tablets by mouth every 6 (six) hours as needed for moderate pain. 40 tablet 0  . levothyroxine (SYNTHROID) 125 MCG tablet Take 1 tablet (125 mcg total) by mouth daily before breakfast. Dispense as written: Synthroid LAST REFILL 90 tablet 3  . methocarbamol (ROBAXIN) 500 MG tablet Take 1 tablet (500 mg total) by mouth every 6 (six) hours as needed for muscle spasms. 40 tablet 0  . polyethylene glycol (MIRALAX / GLYCOLAX) packet Take 17 g by mouth daily as needed for mild constipation. 14 each 0  . warfarin (JANTOVEN) 5 MG tablet Take 1.5-2 tablets (7.5-10 mg total) by mouth See admin instructions. TAKE AS DIRECTED BY ANTICOAGULATION CLINIC. 7.5 MG  MWF&SUN 10 MG TUE,THURS,SAT     No current facility-administered medications for this visit.     OBJECTIVE: Older white woman Examined in a wheelchair  Vitals:   01/25/17 1537  BP: 127/61  Pulse: 84  Resp: 18  Temp: 98.1 F (36.7 C)  SpO2: 99%     Body mass index is 35.19 kg/m.    ECOG FS:2 - Symptomatic, <50% confined to bed  Sclerae unicteric, pupils round and equal Oropharynx clear, slightly dry No cervical or supraclavicular adenopathy Lungs no rales or rhonchi Heart regular rate and rhythm Abd soft, nontender, positive bowel sounds MSK status post recent left total hip. There is minimal swelling in the left lower extremity; there is no erythema; she is able to move the left foot without difficulty. Neuro: nonfocal, well oriented, appropriate affect Breasts: Deferred  LAB RESULTS:  CMP     Component Value Date/Time   NA 137 01/25/2017 1453   K 4.1 01/25/2017 1453   CL 107 01/20/2017 0353   CO2 28 01/25/2017 1453   GLUCOSE 143 (H) 01/25/2017 1453   BUN 19.1 01/25/2017 1453   CREATININE 0.9 01/25/2017 1453   CALCIUM 10.8 (H) 01/25/2017 1453   PROT 6.3 (L) 01/25/2017 1453   ALBUMIN 2.9 (L) 01/25/2017 1453   AST 16 01/25/2017 1453   ALT 30 01/25/2017 1453   ALKPHOS 96 01/25/2017 1453   BILITOT 0.48 01/25/2017 1453   GFRNONAA >60 01/20/2017 0353   GFRNONAA 75 08/19/2015 1602   GFRAA >60 01/20/2017 0353   GFRAA 87 08/19/2015 1602    I No results found for: SPEP  Lab Results  Component Value Date   WBC 10.8 (H) 01/25/2017   NEUTROABS 8.3 (H) 01/25/2017   HGB 10.2 (L) 01/25/2017   HCT 32.1 (L) 01/25/2017   MCV 95.8 01/25/2017   PLT 337 01/25/2017      Chemistry      Component Value Date/Time   NA 137 01/25/2017 1453   K 4.1 01/25/2017 1453   CL 107 01/20/2017 0353   CO2 28 01/25/2017 1453   BUN 19.1 01/25/2017 1453   CREATININE 0.9 01/25/2017 1453      Component Value Date/Time   CALCIUM 10.8 (H) 01/25/2017 1453   ALKPHOS 96 01/25/2017 1453    AST 16 01/25/2017 1453   ALT 30 01/25/2017 1453   BILITOT 0.48 01/25/2017 1453       No results found for: LABCA2  No components found for: LABCA125   Recent Labs Lab 01/21/17 0349  INR 1.39    Urinalysis    Component Value Date/Time   COLORURINE STRAW (A) 01/15/2017 2000   APPEARANCEUR CLEAR 01/15/2017 2000   LABSPEC 1.008 01/15/2017 2000   LABSPEC 1.010  09/22/2016 1553   PHURINE 6.0 01/15/2017 2000   GLUCOSEU NEGATIVE 01/15/2017 2000   GLUCOSEU Negative 09/22/2016 1553   HGBUR SMALL (A) 01/15/2017 2000   BILIRUBINUR NEGATIVE 01/15/2017 2000   BILIRUBINUR Negative 01/06/2017 1608   BILIRUBINUR Negative 09/22/2016 [place] 01/15/2017 2000   PROTEINUR NEGATIVE 01/15/2017 2000   UROBILINOGEN 0.2 01/06/2017 1608   UROBILINOGEN 0.2 11/15/2016 1358   UROBILINOGEN 0.2 09/22/2016 1553   NITRITE NEGATIVE 01/15/2017 2000   LEUKOCYTESUR NEGATIVE 01/15/2017 2000   LEUKOCYTESUR Negative 09/22/2016 1553   Results for NIKOLETTE, REINDL (MRN [id number]) as of 06/23/2016 17:38  Ref. Range 03/24/2016 15:15 04/07/2016 14:40 04/20/2016 14:33 04/27/2016 14:34 05/05/2016 14:46 05/19/2016 14:27 05/26/2016 14:09 06/23/2016 14:25  INR Latest Ref Range: 2.00 - 3.50  1.90 (L) 2.60 1.30 (L) 3.70 (H) 2.60 3.80 (H) 1.80 (L) 2.90   STUDIES: Ct Pelvis Wo Contrast  Result Date: 01/19/2017 CLINICAL DATA:  Patient status post left hip replacement 01/16/2017 for a pathologic fracture of the left femoral neck due to metastatic breast carcinoma. Continued left hip pain. EXAM: CT PELVIS WITHOUT CONTRAST TECHNIQUE: Multidetector CT imaging of the pelvis was performed following the standard protocol without intravenous contrast. COMPARISON:  CT abdomen and pelvis 01/15/2017. Plain films of the pelvis left hip 01/16/2017. FINDINGS: Since the prior CT scan, the patient has undergone left total hip replacement. The device is located. There is a periprosthetic fracture of the left femur. The fracture  is nondisplaced and extends through the anterior cortex of the from a point approximately 8 cm below the top of the greater trochanter inferiorly through the lateral cortex approximately 4 cm below the tip of the femoral stem. No other fracture is identified. No focal lesion is seen. Gas in the soft tissues of the anterior and medial compartments of the left thighs presumably postoperative. Gas extends into the short adductor superiorly. There s hematoma in subcutaneous fat of the left buttock most consistent with postoperative change. Lower lumbar spondylosis is noted. Atherosclerotic vascular disease identified. IMPRESSION: Status post left total hip replacement with a nondisplaced periprosthetic fracture extending approximately 8 cm below the top of the greater trochanter to approximately 4 cm below the tip of the femoral stem. Electronically Signed   By: Inge Rise M.D.   On: 01/19/2017 10:55   US Transvaginal Non-ob  Result Date: 01/17/2017 CLINICAL DATA:  Abnormal CT EXAM: TRANSABDOMINAL AND TRANSVAGINAL ULTRASOUND OF PELVIS TECHNIQUE: Both transabdominal and transvaginal ultrasound examinations of the pelvis were performed. Transabdominal technique was performed for global imaging of the pelvis including uterus, ovaries, adnexal regions, and pelvic cul-de-sac. It was necessary to proceed with endovaginal exam following the transabdominal exam to visualize the uterus, endometrium, ovaries and adnexa . COMPARISON:  CT 01/15/2017 FINDINGS: Uterus Measurements: 6.2 x 3.3 x 4.6 cm. No fibroids or other mass visualized. Endometrium Thickness: Thickened, measuring 15 mm, heterogeneous appearance. Right ovary Measurements: 1.6 x 1.1 x 1.2 cm. Normal appearance/no adnexal mass. Left ovary Measurements: 2.6 x 1.7 x 1.8 cm. 1.5 x 1.2 x 1.1 cm simple appearing cyst noted in the left ovary. Other findings No abnormal free fluid. IMPRESSION: Endometrial thickness is considered abnormal for an asymptomatic  post-menopausal female. Endometrial sampling should be considered to exclude carcinoma. 1.5 cm simple appearing cyst in the left ovary, likely benign. This could be followed with repeat ultrasound in 1 year. Electronically Signed   By: Rolm Baptise M.D.   On: 01/17/2017 08:48   US Pelvis Complete  Result Date:  01/17/2017 CLINICAL DATA:  Abnormal CT EXAM: TRANSABDOMINAL AND TRANSVAGINAL ULTRASOUND OF PELVIS TECHNIQUE: Both transabdominal and transvaginal ultrasound examinations of the pelvis were performed. Transabdominal technique was performed for global imaging of the pelvis including uterus, ovaries, adnexal regions, and pelvic cul-de-sac. It was necessary to proceed with endovaginal exam following the transabdominal exam to visualize the uterus, endometrium, ovaries and adnexa . COMPARISON:  CT 01/15/2017 FINDINGS: Uterus Measurements: 6.2 x 3.3 x 4.6 cm. No fibroids or other mass visualized. Endometrium Thickness: Thickened, measuring 15 mm, heterogeneous appearance. Right ovary Measurements: 1.6 x 1.1 x 1.2 cm. Normal appearance/no adnexal mass. Left ovary Measurements: 2.6 x 1.7 x 1.8 cm. 1.5 x 1.2 x 1.1 cm simple appearing cyst noted in the left ovary. Other findings No abnormal free fluid. IMPRESSION: Endometrial thickness is considered abnormal for an asymptomatic post-menopausal female. Endometrial sampling should be considered to exclude carcinoma. 1.5 cm simple appearing cyst in the left ovary, likely benign. This could be followed with repeat ultrasound in 1 year. Electronically Signed   By: Rolm Baptise M.D.   On: 01/17/2017 08:48   Ct Abdomen Pelvis W Contrast  Result Date: 01/15/2017 CLINICAL DATA:  Stated history of pelvic mass.  Left hip pain. EXAM: CT ABDOMEN AND PELVIS WITH CONTRAST TECHNIQUE: Multidetector CT imaging of the abdomen and pelvis was performed using the standard protocol following bolus administration of intravenous contrast. CONTRAST:  100 cc Isovue-300 IV COMPARISON:   None. FINDINGS: Lower chest: Calcified granuloma in the right lower lobe. No consolidation or pleural fluid. Mild distal esophageal wall thickening. Hepatobiliary: No focal liver abnormality is seen. No gallstones, gallbladder wall thickening, or biliary dilatation. Pancreas: No ductal dilatation or inflammation. Spleen: Scattered granuloma.  Normal in size. Adrenals/Urinary Tract: Minimal thickening of the left adrenal gland without discrete nodule. Right adrenal gland is normal. No hydronephrosis or perinephric edema. No focal renal mass. Homogeneous enhancement with symmetric excretion on delayed phase imaging. Urinary bladder is minimally distended without wall thickening. Stomach/Bowel: Thickening of the distal esophagus. Stomach is within normal limits. Appendix appears normal. No evidence of bowel wall thickening, distention, or inflammatory changes. Colonic diverticulosis without acute diverticulitis. Vascular/Lymphatic: Aortic atherosclerosis without aneurysm. Subcentimeter porta hepatis node. No retroperitoneal or mesenteric adenopathy. No pelvic adenopathy. Reproductive: Question of endometrial thickening, 14-16 mm. 14 mm left ovarian cyst. Right ovary is tentatively identified and quiescent. Other: No ascites. No free air. Small fat containing umbilical hernia. No subcutaneous soft tissue mass. Musculoskeletal: Lytic lesion in the left femoral neck measures at least 6.1 cm with marked thinning of the femoral cortex. Pathologic fracture through the femoral neck. Minimal T12 superior endplate compression fracture, age indeterminate. No evidence of underlying focal lesion on CT. Multilevel degenerative disc disease and facet arthropathy of the lumbar spine. No evidence of additional focal bone lesion. IMPRESSION: 1. Lytic lesion in the left proximal femur with pathologic femoral neck fracture. This may be secondary to metastatic disease versus multiple myeloma/plasmacytoma in a patient of this age. 2.  Probable endometrial thickening, abnormal for a postmenopausal patient. This may be endometrial neoplasm. Consider pelvic ultrasound for more detailed endometrial evaluation. Left ovarian cyst measuring 14 mm. Recommend yearly sonographic follow-up. 3. Thickening of the distal esophagus may be secondary to reflux or esophagitis. 4. Colonic diverticulosis without diverticulitis. Aortic Atherosclerosis (ICD10-I70.0). 5. Sequela of prior granulomatous disease with calcified right lung base nodule and splenic calcifications. Electronically Signed   By: Jeb Levering M.D.   On: 01/15/2017 22:38   Chest Portable 1 View  Result Date: 01/16/2017 CLINICAL DATA:  Shortness of breath. History of breast cancer, pneumonia. EXAM: PORTABLE CHEST 1 VIEW COMPARISON:  Chest radiograph June 14, 2016 FINDINGS: Cardiomediastinal silhouette is normal. No pleural effusions or focal consolidations. Mild pulmonary vascular congestion. Trachea projects midline and there is no pneumothorax. Soft tissue planes and included osseous structures are non-suspicious. Surgical clips RIGHT chest wall. Faint calcifications in neck are likely vascular. IMPRESSION: Mild pulmonary vascular congestion. Electronically Signed   By: Elon Alas M.D.   On: 01/16/2017 00:49   Dg Hip Port Unilat With Pelvis 1v Left  Result Date: 01/16/2017 CLINICAL DATA:  Status post left hip replacement. EXAM: DG HIP (WITH OR WITHOUT PELVIS) 1V PORT LEFT COMPARISON:  None. FINDINGS: The patient is status post left hip replacement. Acetabular and femoral components are in good position. Postoperative air seen in the soft tissues. IMPRESSION: Left hip replacement as above. Electronically Signed   By: Dorise Bullion III M.D   On: 01/16/2017 19:52    ASSESSMENT: 76 y.o. [place] woman status post right breast upper outer quadrant lumpectomy and sentinel lymph node sampling 09/09/2013 for an mpT1c pN1a, stage IIA invasive ductal carcinoma, estrogen and  progesterone receptor both 100% positive with strong staining intensity, MIB-1 of 17% and no HER-2 amplification  (1) additional surgery for margin clearance 09/16/2013 obtained negative margins  (2) Oncotype DX recurrence score of 4 predicts a risk of outside the breast recurrence within 10 years of 7% if the patient's only systemic therapy is tamoxifen for 5 years. It also predicts no benefit from chemotherapy  (3) adjuvant radiation completed 01/07/2014  (4) anastrozole started 02/27/2014 stopped within 2 weeks because of arm swelling.   (a) bone density April 2016 showed osteopenia, with a t-score of -1.6  (b) anastrozole resumed 12/17/2015  (5) history of left lower extremity DVT 11/23/2012, initially on rivaroxaban, which caused chest pain, switch to Coumadin July 2014   METASTATIC DISEASE: August 2018 (6) status post left total hip replacement 01/16/2017 for estrogen receptor positive adenocarcinoma.  PLAN:  Jatziri now has metastatic breast cancer. I do not know if she has visceral disease. Hopefully not, but this needs to be addressed and we will set her up for CT scan of the chest once she is more mobile. She will also need a bone scan to assess for additional sites of disease and a bone survey to pickup on lytic lesions that might not show on a bone scan.  She will benefit from radiation oncology to the left hip area. That consult has been placed.  She is continuing on anastrozole for now, since we were not able to give her her first fulvestrant dose today. She was not able to stand to receive it. Once she is able to start fulvestrant however we will stop the anastrozole. She will then start palbociclib as well. We discussed these medications in a preliminary fashion today but she will need a full orientation at the next visit.  I encouraged her to make the most of her rehabilitation support and hopefully at the next visit here she will be able to walk and, even if with a  walker.  She knows to call for any problems that may develop before then. Chauncey Cruel, MD   01/25/2017 6:00 PM

## 2017-01-26 ENCOUNTER — Encounter: Payer: Medicare Other | Admitting: Physical Therapy

## 2017-01-26 ENCOUNTER — Encounter (HOSPITAL_COMMUNITY): Payer: Self-pay | Admitting: Orthopedic Surgery

## 2017-01-26 ENCOUNTER — Inpatient Hospital Stay (HOSPITAL_COMMUNITY)
Admission: AD | Admit: 2017-01-26 | Discharge: 2017-02-03 | DRG: 467 | Disposition: A | Discharge status: Skilled Nursing Facility | Payer: Medicare Other | Source: Other Acute Inpatient Hospital | Attending: Orthopedic Surgery | Admitting: Orthopedic Surgery

## 2017-01-26 DIAGNOSIS — Z96649 Presence of unspecified artificial hip joint: Secondary | ICD-10-CM

## 2017-01-26 DIAGNOSIS — M25552 Pain in left hip: Secondary | ICD-10-CM | POA: Diagnosis not present

## 2017-01-26 DIAGNOSIS — Z884 Allergy status to anesthetic agent status: Secondary | ICD-10-CM

## 2017-01-26 DIAGNOSIS — Z880 Allergy status to penicillin: Secondary | ICD-10-CM | POA: Diagnosis not present

## 2017-01-26 DIAGNOSIS — Z96643 Presence of artificial hip joint, bilateral: Secondary | ICD-10-CM | POA: Diagnosis not present

## 2017-01-26 DIAGNOSIS — Z09 Encounter for follow-up examination after completed treatment for conditions other than malignant neoplasm: Secondary | ICD-10-CM

## 2017-01-26 DIAGNOSIS — T8189XA Other complications of procedures, not elsewhere classified, initial encounter: Secondary | ICD-10-CM | POA: Diagnosis not present

## 2017-01-26 DIAGNOSIS — M25561 Pain in right knee: Secondary | ICD-10-CM | POA: Diagnosis not present

## 2017-01-26 DIAGNOSIS — Z888 Allergy status to other drugs, medicaments and biological substances status: Secondary | ICD-10-CM

## 2017-01-26 DIAGNOSIS — Z853 Personal history of malignant neoplasm of breast: Secondary | ICD-10-CM | POA: Diagnosis not present

## 2017-01-26 DIAGNOSIS — T84011D Broken internal left hip prosthesis, subsequent encounter: Secondary | ICD-10-CM | POA: Diagnosis not present

## 2017-01-26 DIAGNOSIS — Z79811 Long term (current) use of aromatase inhibitors: Secondary | ICD-10-CM

## 2017-01-26 DIAGNOSIS — R2689 Other abnormalities of gait and mobility: Secondary | ICD-10-CM | POA: Diagnosis not present

## 2017-01-26 DIAGNOSIS — D649 Anemia, unspecified: Secondary | ICD-10-CM | POA: Diagnosis present

## 2017-01-26 DIAGNOSIS — Z923 Personal history of irradiation: Secondary | ICD-10-CM | POA: Diagnosis not present

## 2017-01-26 DIAGNOSIS — C7951 Secondary malignant neoplasm of bone: Secondary | ICD-10-CM | POA: Diagnosis present

## 2017-01-26 DIAGNOSIS — I890 Lymphedema, not elsewhere classified: Secondary | ICD-10-CM | POA: Diagnosis not present

## 2017-01-26 DIAGNOSIS — E039 Hypothyroidism, unspecified: Secondary | ICD-10-CM | POA: Diagnosis not present

## 2017-01-26 DIAGNOSIS — Z882 Allergy status to sulfonamides status: Secondary | ICD-10-CM

## 2017-01-26 DIAGNOSIS — C50919 Malignant neoplasm of unspecified site of unspecified female breast: Secondary | ICD-10-CM | POA: Diagnosis present

## 2017-01-26 DIAGNOSIS — M898X5 Other specified disorders of bone, thigh: Secondary | ICD-10-CM | POA: Diagnosis not present

## 2017-01-26 DIAGNOSIS — Z96642 Presence of left artificial hip joint: Secondary | ICD-10-CM | POA: Diagnosis not present

## 2017-01-26 DIAGNOSIS — Z87440 Personal history of urinary (tract) infections: Secondary | ICD-10-CM | POA: Diagnosis not present

## 2017-01-26 DIAGNOSIS — Z7901 Long term (current) use of anticoagulants: Secondary | ICD-10-CM | POA: Diagnosis not present

## 2017-01-26 DIAGNOSIS — S72142A Displaced intertrochanteric fracture of left femur, initial encounter for closed fracture: Secondary | ICD-10-CM | POA: Diagnosis not present

## 2017-01-26 DIAGNOSIS — G893 Neoplasm related pain (acute) (chronic): Secondary | ICD-10-CM | POA: Diagnosis not present

## 2017-01-26 DIAGNOSIS — S72309A Unspecified fracture of shaft of unspecified femur, initial encounter for closed fracture: Secondary | ICD-10-CM | POA: Diagnosis not present

## 2017-01-26 DIAGNOSIS — G8911 Acute pain due to trauma: Secondary | ICD-10-CM | POA: Diagnosis not present

## 2017-01-26 DIAGNOSIS — Z471 Aftercare following joint replacement surgery: Secondary | ICD-10-CM | POA: Diagnosis not present

## 2017-01-26 DIAGNOSIS — M9702XA Periprosthetic fracture around internal prosthetic left hip joint, initial encounter: Principal | ICD-10-CM | POA: Diagnosis present

## 2017-01-26 DIAGNOSIS — I10 Essential (primary) hypertension: Secondary | ICD-10-CM | POA: Diagnosis not present

## 2017-01-26 DIAGNOSIS — S7290XA Unspecified fracture of unspecified femur, initial encounter for closed fracture: Secondary | ICD-10-CM

## 2017-01-26 DIAGNOSIS — F418 Other specified anxiety disorders: Secondary | ICD-10-CM | POA: Diagnosis not present

## 2017-01-26 DIAGNOSIS — S79911A Unspecified injury of right hip, initial encounter: Secondary | ICD-10-CM | POA: Diagnosis not present

## 2017-01-26 DIAGNOSIS — T8131XA Disruption of external operation (surgical) wound, not elsewhere classified, initial encounter: Secondary | ICD-10-CM | POA: Diagnosis not present

## 2017-01-26 DIAGNOSIS — Z885 Allergy status to narcotic agent status: Secondary | ICD-10-CM

## 2017-01-26 DIAGNOSIS — M978XXA Periprosthetic fracture around other internal prosthetic joint, initial encounter: Secondary | ICD-10-CM

## 2017-01-26 DIAGNOSIS — M25562 Pain in left knee: Secondary | ICD-10-CM | POA: Diagnosis not present

## 2017-01-26 DIAGNOSIS — M1990 Unspecified osteoarthritis, unspecified site: Secondary | ICD-10-CM | POA: Diagnosis not present

## 2017-01-26 DIAGNOSIS — M9702XD Periprosthetic fracture around internal prosthetic left hip joint, subsequent encounter: Secondary | ICD-10-CM | POA: Diagnosis not present

## 2017-01-26 DIAGNOSIS — R600 Localized edema: Secondary | ICD-10-CM | POA: Diagnosis not present

## 2017-01-26 DIAGNOSIS — N8500 Endometrial hyperplasia, unspecified: Secondary | ICD-10-CM | POA: Diagnosis not present

## 2017-01-26 DIAGNOSIS — R278 Other lack of coordination: Secondary | ICD-10-CM | POA: Diagnosis not present

## 2017-01-26 DIAGNOSIS — C7981 Secondary malignant neoplasm of breast: Secondary | ICD-10-CM | POA: Diagnosis not present

## 2017-01-26 LAB — CBC
HEMATOCRIT: 31.3 % — AB (ref 36.0–46.0)
Hemoglobin: 10.3 g/dL — ABNORMAL LOW (ref 12.0–15.0)
MCH: 30.7 pg (ref 26.0–34.0)
MCHC: 32.9 g/dL (ref 30.0–36.0)
MCV: 93.2 fL (ref 78.0–100.0)
PLATELETS: 391 10*3/uL (ref 150–400)
RBC: 3.36 MIL/uL — ABNORMAL LOW (ref 3.87–5.11)
RDW: 14.9 % (ref 11.5–15.5)
WBC: 14.7 10*3/uL — AB (ref 4.0–10.5)

## 2017-01-26 LAB — BASIC METABOLIC PANEL
ANION GAP: 6 (ref 5–15)
BUN: 16 mg/dL (ref 6–20)
CALCIUM: 10.4 mg/dL — AB (ref 8.9–10.3)
CO2: 28 mmol/L (ref 22–32)
CREATININE: 0.59 mg/dL (ref 0.44–1.00)
Chloride: 104 mmol/L (ref 101–111)
GFR calc Af Amer: >60 mL/min (ref >60)
GLUCOSE: 130 mg/dL — AB (ref 65–99)
POTASSIUM: 4.6 mmol/L (ref 3.5–5.1)
Sodium: 138 mmol/L (ref 135–145)

## 2017-01-26 MED ORDER — HYDROCODONE-ACETAMINOPHEN 7.5-325 MG PO TABS
1.0000 | ORAL_TABLET | ORAL | Status: DC | PRN
  Administered 2017-01-26 – 2017-01-31 (×21): 2 via ORAL
  Filled 2017-01-26 (×23): qty 2

## 2017-01-26 MED ORDER — ANASTROZOLE 1 MG PO TABS
1.0000 mg | ORAL_TABLET | Freq: Every day | ORAL | Status: DC
  Administered 2017-01-26 – 2017-02-03 (×8): 1 mg via ORAL
  Filled 2017-01-26 (×9): qty 1

## 2017-01-26 MED ORDER — ENOXAPARIN SODIUM 40 MG/0.4ML ~~LOC~~ SOLN
40.0000 mg | SUBCUTANEOUS | Status: DC
  Administered 2017-01-26 – 2017-01-29 (×4): 40 mg via SUBCUTANEOUS
  Filled 2017-01-26 (×4): qty 0.4

## 2017-01-26 MED ORDER — SODIUM CHLORIDE 0.9 % IV SOLN
INTRAVENOUS | Status: DC
  Administered 2017-01-26: 10 mL/h via INTRAVENOUS

## 2017-01-26 MED ORDER — ONDANSETRON HCL 4 MG PO TABS
4.0000 mg | ORAL_TABLET | Freq: Four times a day (QID) | ORAL | Status: DC | PRN
  Filled 2017-01-26: qty 1

## 2017-01-26 MED ORDER — LEVOTHYROXINE SODIUM 125 MCG PO TABS
125.0000 ug | ORAL_TABLET | Freq: Every day | ORAL | Status: DC
  Administered 2017-01-27 – 2017-02-03 (×7): 125 ug via ORAL
  Filled 2017-01-26 (×7): qty 1

## 2017-01-26 MED ORDER — ONDANSETRON HCL 4 MG/2ML IJ SOLN
4.0000 mg | Freq: Four times a day (QID) | INTRAMUSCULAR | Status: DC | PRN
  Administered 2017-01-31: 4 mg via INTRAVENOUS
  Filled 2017-01-26: qty 2

## 2017-01-26 MED ORDER — HYDROMORPHONE HCL-NACL 0.5-0.9 MG/ML-% IV SOSY
0.5000 mg | PREFILLED_SYRINGE | INTRAVENOUS | Status: DC | PRN
  Filled 2017-01-26 (×2): qty 1

## 2017-01-26 NOTE — H&P (Signed)
Alicia Vasquez is an 76 y.o. female.    Chief Complaint:   Left hip pain s/p THA  HPI: Pt is a 76 y.o. female complaining of left hip pain since surgery on 01/16/2017, but worse just recently when her leg was moved rapidly while at a facility.  She has already had a THA due to a pathologic hip fracture. Patient orginally presented to the clinic where x-rays were taken and revealed a peri-prosthetic hip fracture.  She was directly admitted to the hospital.  Various options are discussed with the patient. Risks, benefits and expectations were discussed with the patient. Patient understand the risks, benefits and expectations and wishes to proceed with surgery.    PCP: Briscoe Deutscher, DO  D/C Plans:      SNF  Post-op Meds:       No Rx given   FYI:     Warfarin  Oxycodone  DME:   Pt already has equipment    PMH: Past Medical History:  Diagnosis Date  . Allergy   . Anxiety   . Arthritis   . Blood transfusion without reported diagnosis   . Breast cancer ([place]) 07/12/2013   Invasive Mammary Carcinoma  . DVT (deep vein thrombosis) in pregnancy ([place])   . Hypertension   . Hypothyroid   . Personal history of radiation therapy   . Pneumonia   . Radiation 11/21/13-01/07/14   Right Breast/Supraclavicular    PSH: Past Surgical History:  Procedure Laterality Date  . BREAST LUMPECTOMY WITH RADIOACTIVE SEED LOCALIZATION Right 09/09/2013   Procedure: BREAST LUMPECTOMY WITH RADIOACTIVE SEED LOCALIZATION WITH AXILLARY NODE EXCISION;  Surgeon: [name], MD;  Location: [facility];  Service: General;  Laterality: Right;  . DENTAL SURGERY  04/19/2012   13 TEETH REMOVED  . DILATION AND CURETTAGE OF UTERUS    . RE-EXCISION OF BREAST LUMPECTOMY Right 09/24/2013   Procedure: RE-EXCISION OF RIGHT BREAST LUMPECTOMY;  Surgeon: [name], MD;  Location: [facility];  Service: General;  Laterality: Right;  . TOTAL HIP ARTHROPLASTY Left 01/16/2017   Procedure:  TOTAL HIP ARTHROPLASTY POSTERIOR;  Surgeon: Paralee Cancel, MD;  Location: [facility];  Service: Orthopedics;  Laterality: Left;    Social History:  reports that she has never smoked. She has never used smokeless tobacco. She reports that she does not drink alcohol or use drugs.  Allergies:  Allergies  Allergen Reactions  . Anesthetics, Amide Hypertension  . Benadryl [Diphenhydramine Hcl] Other (See Comments)    Dizziness  . Carbocaine [Mepivacaine Hcl] Hypertension  . Codeine Other (See Comments)    Dizziness  . Epinephrine Hypertension  . Sulfa Antibiotics Other (See Comments)    dizziness  . Tramadol     Sedation.   Marland Kitchen Penicillins Rash    Medications: Current Facility-Administered Medications  Medication Dose Route Frequency Provider Last Rate Last Dose  . 0.9 %  sodium chloride infusion   Intravenous Continuous Gavin Faivre, PA-C      . anastrozole (ARIMIDEX) tablet 1 mg  1 mg Oral Daily Azarion Hove, PA-C      . enoxaparin (LOVENOX) injection 40 mg  40 mg Subcutaneous Q24H Danae Orleans, PA-C      . HYDROcodone-acetaminophen (NORCO) 7.5-325 MG per tablet 1-2 tablet  1-2 tablet Oral Q4H PRN Danae Orleans, PA-C      . HYDROmorphone (DILAUDID) injection 0.5-1 mg  0.5-1 mg Intravenous Q2H PRN Danae Orleans, PA-C      . [START ON 01/27/2017] levothyroxine (SYNTHROID, LEVOTHROID)  tablet 125 mcg  125 mcg Oral QAC breakfast Aldair Rickel, PA-C      . ondansetron [facility]) tablet 4 mg  4 mg Oral Q6H PRN Danae Orleans, PA-C       Or  . ondansetron [facility]) injection 4 mg  4 mg Intravenous Q6H PRN Danae Orleans, PA-C        Results for orders placed or performed in visit on 01/25/17 (from the past 48 hour(s))  CBC with Differential     Status: Abnormal   Collection Time: 01/25/17  2:53 PM  Result Value Ref Range   WBC 10.8 (H) 3.9 - 10.3 10e3/uL   NEUT# 8.3 (H) 1.5 - 6.5 10e3/uL   HGB 10.2 (L) 11.6 - 15.9 g/dL   HCT 32.1 (L) 34.8 - 46.6 %   Platelets 337 145 - 400  10e3/uL   MCV 95.8 79.5 - 101.0 fL   MCH 30.4 25.1 - 34.0 pg   MCHC 31.8 31.5 - 36.0 g/dL   RBC 3.35 (L) 3.70 - 5.45 10e6/uL   RDW 14.8 (H) 11.2 - 14.5 %   lymph# 1.2 0.9 - 3.3 10e3/uL   MONO# 1.1 (H) 0.1 - 0.9 10e3/uL   Eosinophils Absolute 0.2 0.0 - 0.5 10e3/uL   Basophils Absolute 0.0 0.0 - 0.1 10e3/uL   NEUT% 76.8 38.4 - 76.8 %   LYMPH% 11.5 (L) 14.0 - 49.7 %   MONO% 9.9 0.0 - 14.0 %   EOS% 1.4 0.0 - 7.0 %   BASO% 0.4 0.0 - 2.0 %  Comprehensive metabolic panel     Status: Abnormal   Collection Time: 01/25/17  2:53 PM  Result Value Ref Range   Sodium 137 136 - 145 mEq/L   Potassium 4.1 3.5 - 5.1 mEq/L   Chloride 104 98 - 109 mEq/L   CO2 28 22 - 29 mEq/L   Glucose 143 (H) 70 - 140 mg/dl    Comment: Glucose reference range is for nonfasting patients. Fasting glucose reference range is 70- 100.   BUN 19.1 7.0 - 26.0 mg/dL   Creatinine 0.9 0.6 - 1.1 mg/dL   Total Bilirubin 0.48 0.20 - 1.20 mg/dL   Alkaline Phosphatase 96 40 - 150 U/L   AST 16 5 - 34 U/L   ALT 30 0 - 55 U/L   Total Protein 6.3 (L) 6.4 - 8.3 g/dL   Albumin 2.9 (L) 3.5 - 5.0 g/dL   Calcium 10.8 (H) 8.4 - 10.4 mg/dL   Anion Gap 5 3 - 11 mEq/L   EGFR 58 (L) >90 ml/min/1.73 m2    Comment: eGFR is calculated using the CKD-EPI Creatinine Equation (2009)      Review of Systems  Constitutional: Negative.   HENT: Negative.   Eyes: Negative.   Respiratory: Negative.   Cardiovascular: Negative.   Gastrointestinal: Negative.   Genitourinary: Negative.   Musculoskeletal: Positive for joint pain.  Skin: Negative.   Neurological: Negative.   Endo/Heme/Allergies: Positive for environmental allergies.  Psychiatric/Behavioral: The patient is nervous/anxious.        Physical Exam  Constitutional: She is oriented to person, place, and time. She appears well-developed.  HENT:  Head: Normocephalic.  Eyes: Pupils are equal, round, and reactive to light.  Neck: Neck supple. No JVD present. No tracheal deviation  present. No thyromegaly present.  Cardiovascular: Normal rate, regular rhythm and intact distal pulses.   Respiratory: Effort normal and breath sounds normal. No respiratory distress. She has no wheezes.  GI: Soft. There is no tenderness.  There is no guarding.  Musculoskeletal:       Left hip: She exhibits decreased range of motion, decreased strength, tenderness, bony tenderness and swelling.  Lymphadenopathy:    She has no cervical adenopathy.  Neurological: She is alert and oriented to person, place, and time.  Skin: Skin is warm and dry.  Psychiatric: She has a normal mood and affect.       Assessment/Plan Assessment:   Left peri-prosthetic femur fracture  Plan: Patient will undergo an ORIF of the left peri-prosthetic femur fracture on 01/31/2017 per [name] at [facility]. Risks benefits and expectations were discussed with the patient. Patient understand risks, benefits and expectations and wishes to proceed.   West Pugh Gerrica Cygan   PA-C  01/26/2017, 12:08 PM

## 2017-01-27 ENCOUNTER — Encounter: Payer: Self-pay | Admitting: Radiation Oncology

## 2017-01-27 LAB — CBC
HCT: 29.6 % — ABNORMAL LOW (ref 36.0–46.0)
HEMOGLOBIN: 9.5 g/dL — AB (ref 12.0–15.0)
MCH: 29.9 pg (ref 26.0–34.0)
MCHC: 32.1 g/dL (ref 30.0–36.0)
MCV: 93.1 fL (ref 78.0–100.0)
PLATELETS: 363 10*3/uL (ref 150–400)
RBC: 3.18 MIL/uL — AB (ref 3.87–5.11)
RDW: 15 % (ref 11.5–15.5)
WBC: 11.4 10*3/uL — AB (ref 4.0–10.5)

## 2017-01-27 LAB — BASIC METABOLIC PANEL
ANION GAP: 6 (ref 5–15)
BUN: 18 mg/dL (ref 6–20)
CALCIUM: 9.9 mg/dL (ref 8.9–10.3)
CO2: 25 mmol/L (ref 22–32)
CREATININE: 0.64 mg/dL (ref 0.44–1.00)
Chloride: 108 mmol/L (ref 101–111)
GFR calc non Af Amer: >60 mL/min (ref >60)
Glucose, Bld: 104 mg/dL — ABNORMAL HIGH (ref 65–99)
Potassium: 3.9 mmol/L (ref 3.5–5.1)
SODIUM: 139 mmol/L (ref 135–145)

## 2017-01-27 MED ORDER — POLYETHYLENE GLYCOL 3350 17 G PO PACK
17.0000 g | PACK | Freq: Two times a day (BID) | ORAL | Status: DC
  Administered 2017-01-28 – 2017-02-02 (×3): 17 g via ORAL
  Filled 2017-01-27 (×7): qty 1

## 2017-01-27 MED ORDER — DOCUSATE SODIUM 100 MG PO CAPS
100.0000 mg | ORAL_CAPSULE | Freq: Two times a day (BID) | ORAL | Status: DC
  Administered 2017-01-27 – 2017-01-30 (×4): 100 mg via ORAL
  Filled 2017-01-27 (×6): qty 1

## 2017-01-27 MED ORDER — FERROUS SULFATE 325 (65 FE) MG PO TABS
325.0000 mg | ORAL_TABLET | Freq: Three times a day (TID) | ORAL | Status: DC
  Administered 2017-01-28 – 2017-01-30 (×9): 325 mg via ORAL
  Filled 2017-01-27 (×9): qty 1

## 2017-01-27 MED ORDER — METHOCARBAMOL 500 MG PO TABS
500.0000 mg | ORAL_TABLET | Freq: Four times a day (QID) | ORAL | Status: DC | PRN
  Administered 2017-01-27 – 2017-02-03 (×16): 500 mg via ORAL
  Filled 2017-01-27 (×16): qty 1

## 2017-01-27 NOTE — Progress Notes (Signed)
CSW consulted to assist with dc planning. Pt was recently hospitalized at [facility] from 01/15/17-01/21/17 and dc to [facility] on the 25th for [place]. Please see psychosocial assessment dated 01/16/17. Pt was hospitalized from [facility] on 01/26/17 with a femur fx. Surgery is planned for 01/31/17. SNF placement will be required following hospital dc. Pt is part of medicare bundle program. MD has recommended placement at [facility] [place]. Pt has declined to return to [facility]. Clinicals have been sent to [facility] for review. Placement decision is pending. CSW will continue to follow to assist with dc planning.  Werner Lean LCSW [phone]

## 2017-01-28 NOTE — Progress Notes (Signed)
Subjective:   Procedure(s) (LRB): REVISION and OPEN REDUCTION INTERNAL FIXATION (ORIF) PERIPROSTHETIC FRACTURE (Left)  Patient reports pain as mild to moderate.  Tolerating POs well.  Admits to flatus.  Denies fever, chills, N/V, SOB, CP.  Objective:   VITALS:  Temp:  [97.7 F (36.5 C)-97.9 F (36.6 C)] 97.9 F (36.6 C) (09/01 0526) Pulse Rate:  [76-84] 76 (09/01 0526) Resp:  [15-16] 16 (09/01 0526) BP: (114-122)/(46-50) 115/47 (09/01 0526) SpO2:  [98 %] 98 % (09/01 0526)  General: WDWN patient in NAD. Psych:  Appropriate mood and affect. Neuro:  A&O x 3, Moving all extremities, sensation intact to light touch HEENT:  EOMs intact Chest:  Even non-labored respirations Skin:  C/D/I, no rashes or lesions.  Knee immobilizer intact. Extremities: warm/dry, mild edema, no erythmea or echymosis.  No lymphadenopathy. Pulses: Femoral 2+ MSK:  ROM: full ankle ROM, MMT: patient able to perform quad set, (-) Homan's    LABS  Recent Labs  01/25/17 1453 01/26/17 1214 01/27/17 0522  HGB 10.2* 10.3* 9.5*  WBC 10.8* 14.7* 11.4*  PLT 337 391 363    Recent Labs  01/26/17 1214 01/27/17 0522  NA 138 139  K 4.6 3.9  CL 104 108  CO2 28 25  BUN 16 18  CREATININE 0.59 0.64  GLUCOSE 130* 104*   No results for input(s): LABPT, INR in the last 72 hours.   Assessment/Plan:   Procedure(s) (LRB): REVISION and OPEN REDUCTION INTERNAL FIXATION (ORIF) PERIPROSTHETIC FRACTURE (Left)  NWB L LE Plan for operative intervention by [name] on Tuesday 01/31/17.  Mechele Claude, PA-C [facility] Office:  [phone]

## 2017-01-29 LAB — BASIC METABOLIC PANEL
ANION GAP: 6 (ref 5–15)
BUN: 18 mg/dL (ref 6–20)
CALCIUM: 10 mg/dL (ref 8.9–10.3)
CO2: 30 mmol/L (ref 22–32)
CREATININE: 0.66 mg/dL (ref 0.44–1.00)
Chloride: 102 mmol/L (ref 101–111)
GFR calc non Af Amer: >60 mL/min (ref >60)
Glucose, Bld: 107 mg/dL — ABNORMAL HIGH (ref 65–99)
Potassium: 4 mmol/L (ref 3.5–5.1)
Sodium: 138 mmol/L (ref 135–145)

## 2017-01-29 LAB — CBC
HCT: 29.8 % — ABNORMAL LOW (ref 36.0–46.0)
Hemoglobin: 9.5 g/dL — ABNORMAL LOW (ref 12.0–15.0)
MCH: 30.1 pg (ref 26.0–34.0)
MCHC: 31.9 g/dL (ref 30.0–36.0)
MCV: 94.3 fL (ref 78.0–100.0)
PLATELETS: 413 10*3/uL — AB (ref 150–400)
RBC: 3.16 MIL/uL — AB (ref 3.87–5.11)
RDW: 14.6 % (ref 11.5–15.5)
WBC: 9.7 10*3/uL (ref 4.0–10.5)

## 2017-01-29 LAB — PROTIME-INR
INR: 1.19
Prothrombin Time: 15 seconds (ref 11.4–15.2)

## 2017-01-29 NOTE — Progress Notes (Signed)
    Subjective:   Procedure(s) (LRB): REVISION and OPEN REDUCTION INTERNAL FIXATION (ORIF) PERIPROSTHETIC FRACTURE (Left) Patient reports pain as 4 on 0-10 scale.   Denies CP or SOB.  Voiding without difficulty. Positive BM. Continuing to monitor BP.  Pts eating well. No N/V.   Objective: Vital signs in last 24 hours: Temp:  [98.3 F (36.8 C)-98.6 F (37 C)] 98.3 F (36.8 C) (09/02 0621) Pulse Rate:  [84-91] 91 (09/02 0621) Resp:  [15-16] 15 (09/02 0621) BP: (132-141)/(43-60) 138/60 (09/02 0621) SpO2:  [98 %-99 %] 98 % (09/01 2103)  Intake/Output from previous day: 09/01 0701 - 09/02 0700 In: 1320 [P.O.:1320] Out: 2502 [Urine:2500; Stool:2] Intake/Output this shift: No intake/output data recorded.  Labs:  Recent Labs  01/26/17 1214 01/27/17 0522 01/29/17 0506  HGB 10.3* 9.5* 9.5*    Recent Labs  01/27/17 0522 01/29/17 0506  WBC 11.4* 9.7  RBC 3.18* 3.16*  HCT 29.6* 29.8*  PLT 363 413*    Recent Labs  01/27/17 0522 01/29/17 0506  NA 139 138  K 3.9 4.0  CL 108 102  CO2 25 30  BUN 18 18  CREATININE 0.64 0.66  GLUCOSE 104* 107*  CALCIUM 9.9 10.0   No results for input(s): LABPT, INR in the last 72 hours.  Physical Exam: Neurologically intact ABD soft Neurovascular intact Sensation intact distally Dorsiflexion/Plantar flexion intact Incision: no drainage Compartment soft  Assessment/Plan:   Procedure(s) (LRB): REVISION and OPEN REDUCTION INTERNAL FIXATION (ORIF) PERIPROSTHETIC FRACTURE (Left) NWB LLE Surgical intervention scheduled for Tuesday 9/4   Alicia Vasquez, Darla Lesches for Dr. Melina Schools [facility] [phone] 01/29/2017, 8:06 AM

## 2017-01-30 LAB — BASIC METABOLIC PANEL
Anion gap: 9 (ref 5–15)
BUN: 18 mg/dL (ref 6–20)
CALCIUM: 10 mg/dL (ref 8.9–10.3)
CO2: 25 mmol/L (ref 22–32)
CREATININE: 0.57 mg/dL (ref 0.44–1.00)
Chloride: 104 mmol/L (ref 101–111)
Glucose, Bld: 110 mg/dL — ABNORMAL HIGH (ref 65–99)
Potassium: 3.9 mmol/L (ref 3.5–5.1)
SODIUM: 138 mmol/L (ref 135–145)

## 2017-01-30 LAB — CBC
HCT: 29.8 % — ABNORMAL LOW (ref 36.0–46.0)
Hemoglobin: 9.7 g/dL — ABNORMAL LOW (ref 12.0–15.0)
MCH: 29.9 pg (ref 26.0–34.0)
MCHC: 32.6 g/dL (ref 30.0–36.0)
MCV: 92 fL (ref 78.0–100.0)
PLATELETS: 406 10*3/uL — AB (ref 150–400)
RBC: 3.24 MIL/uL — AB (ref 3.87–5.11)
RDW: 14.5 % (ref 11.5–15.5)
WBC: 8.7 10*3/uL (ref 4.0–10.5)

## 2017-01-30 NOTE — Progress Notes (Signed)
   Subjective: Left femur periprosthethic fracture Pain is manageable today Plan for surgery tomorrow with [name] Denies any new symptoms Muscle relaxer seems to be helping along with the norco  Patient reports pain as moderate.  Objective:   VITALS:   Vitals:   01/29/17 2135 01/30/17 0553  BP: (!) 132/56 (!) 131/56  Pulse: 87 89  Resp: 16 16  Temp: 98.1 F (36.7 C) 97.8 F (36.6 C)  SpO2: 100% 99%    Left lower leg with deformity of left thigh nv intact distally Not taken thru any rom due to known fracture  LABS  Recent Labs  01/29/17 0506 01/30/17 0413  HGB 9.5* 9.7*  HCT 29.8* 29.8*  WBC 9.7 8.7  PLT 413* 406*     Recent Labs  01/29/17 0506 01/30/17 0413  NA 138 138  K 4.0 3.9  BUN 18 18  CREATININE 0.66 0.57  GLUCOSE 107* 110*     Assessment/Plan: Left femur periprostethic fracture Plan for surgery tomorrow NPO after midnight  Continue pain management    Merla Riches, MPAS, PA-C  01/30/2017, 7:57 AM

## 2017-01-31 ENCOUNTER — Encounter (HOSPITAL_COMMUNITY): Payer: Self-pay | Admitting: Anesthesiology

## 2017-01-31 ENCOUNTER — Inpatient Hospital Stay (HOSPITAL_COMMUNITY): Payer: Medicare Other

## 2017-01-31 ENCOUNTER — Inpatient Hospital Stay (HOSPITAL_COMMUNITY): Payer: Medicare Other | Admitting: Anesthesiology

## 2017-01-31 ENCOUNTER — Encounter (HOSPITAL_COMMUNITY)
Admission: AD | Disposition: A | Discharge status: Skilled Nursing Facility | Payer: Self-pay | Source: Other Acute Inpatient Hospital | Attending: Orthopedic Surgery

## 2017-01-31 HISTORY — PX: ORIF PERIPROSTHETIC FRACTURE: SHX5034

## 2017-01-31 LAB — BASIC METABOLIC PANEL
Anion gap: 6 (ref 5–15)
BUN: 21 mg/dL — ABNORMAL HIGH (ref 6–20)
CO2: 31 mmol/L (ref 22–32)
Calcium: 10.4 mg/dL — ABNORMAL HIGH (ref 8.9–10.3)
Chloride: 103 mmol/L (ref 101–111)
Creatinine, Ser: 0.71 mg/dL (ref 0.44–1.00)
GFR calc Af Amer: >60 mL/min (ref >60)
Glucose, Bld: 113 mg/dL — ABNORMAL HIGH (ref 65–99)
POTASSIUM: 4.3 mmol/L (ref 3.5–5.1)
SODIUM: 140 mmol/L (ref 135–145)

## 2017-01-31 LAB — CBC
HCT: 31.7 % — ABNORMAL LOW (ref 36.0–46.0)
HEMOGLOBIN: 10 g/dL — AB (ref 12.0–15.0)
MCH: 29.2 pg (ref 26.0–34.0)
MCHC: 31.5 g/dL (ref 30.0–36.0)
MCV: 92.4 fL (ref 78.0–100.0)
PLATELETS: 419 10*3/uL — AB (ref 150–400)
RBC: 3.43 MIL/uL — AB (ref 3.87–5.11)
RDW: 14.7 % (ref 11.5–15.5)
WBC: 8.7 10*3/uL (ref 4.0–10.5)

## 2017-01-31 LAB — PREPARE RBC (CROSSMATCH)

## 2017-01-31 LAB — POCT I-STAT 4, (NA,K, GLUC, HGB,HCT)
Glucose, Bld: 162 mg/dL — ABNORMAL HIGH (ref 65–99)
HEMATOCRIT: 36 % (ref 36.0–46.0)
HEMOGLOBIN: 12.2 g/dL (ref 12.0–15.0)
POTASSIUM: 4.4 mmol/L (ref 3.5–5.1)
SODIUM: 139 mmol/L (ref 135–145)

## 2017-01-31 SURGERY — OPEN REDUCTION INTERNAL FIXATION (ORIF) PERIPROSTHETIC FRACTURE
Anesthesia: General | Site: Hip | Laterality: Left

## 2017-01-31 MED ORDER — 0.9 % SODIUM CHLORIDE (POUR BTL) OPTIME
TOPICAL | Status: DC | PRN
  Administered 2017-01-31: 1000 mL

## 2017-01-31 MED ORDER — FENTANYL CITRATE (PF) 100 MCG/2ML IJ SOLN
INTRAMUSCULAR | Status: AC
  Filled 2017-01-31: qty 2

## 2017-01-31 MED ORDER — ROCURONIUM BROMIDE 50 MG/5ML IV SOSY
PREFILLED_SYRINGE | INTRAVENOUS | Status: AC
  Filled 2017-01-31: qty 10

## 2017-01-31 MED ORDER — CEFAZOLIN SODIUM-DEXTROSE 2-4 GM/100ML-% IV SOLN
INTRAVENOUS | Status: AC
  Filled 2017-01-31: qty 100

## 2017-01-31 MED ORDER — WARFARIN SODIUM 5 MG PO TABS: 7.5000 mg | ORAL_TABLET | ORAL | Status: DC

## 2017-01-31 MED ORDER — SODIUM CHLORIDE 0.9 % IV SOLN: Freq: Once | INTRAVENOUS | Status: DC

## 2017-01-31 MED ORDER — FENTANYL CITRATE (PF) 100 MCG/2ML IJ SOLN
INTRAMUSCULAR | Status: DC | PRN
  Administered 2017-01-31: 50 ug via INTRAVENOUS
  Administered 2017-01-31: 100 ug via INTRAVENOUS
  Administered 2017-01-31 (×5): 50 ug via INTRAVENOUS

## 2017-01-31 MED ORDER — LACTATED RINGERS IV SOLN
INTRAVENOUS | Status: DC
  Administered 2017-01-31: 14:00:00 via INTRAVENOUS

## 2017-01-31 MED ORDER — PHENOL 1.4 % MT LIQD: 1.0000 | OROMUCOSAL | Status: DC | PRN

## 2017-01-31 MED ORDER — PROPOFOL 10 MG/ML IV BOLUS
INTRAVENOUS | Status: DC | PRN
  Administered 2017-01-31: 150 mg via INTRAVENOUS

## 2017-01-31 MED ORDER — MENTHOL 3 MG MT LOZG: 1.0000 | LOZENGE | OROMUCOSAL | Status: DC | PRN

## 2017-01-31 MED ORDER — HYDROCODONE-ACETAMINOPHEN 7.5-325 MG PO TABS
1.0000 | ORAL_TABLET | ORAL | Status: DC
  Administered 2017-01-31: 1 via ORAL
  Administered 2017-02-01 – 2017-02-03 (×12): 2 via ORAL
  Filled 2017-01-31 (×11): qty 2
  Filled 2017-01-31: qty 1
  Filled 2017-01-31 (×3): qty 2

## 2017-01-31 MED ORDER — STERILE WATER FOR IRRIGATION IR SOLN
Status: DC | PRN
  Administered 2017-01-31: 2000 mL

## 2017-01-31 MED ORDER — MIDAZOLAM HCL 2 MG/2ML IJ SOLN: 0.5000 mg | Freq: Once | INTRAMUSCULAR | Status: DC | PRN

## 2017-01-31 MED ORDER — TRANEXAMIC ACID 1000 MG/10ML IV SOLN
1000.0000 mg | Freq: Once | INTRAVENOUS | Status: AC
  Administered 2017-01-31: 1000 mg via INTRAVENOUS
  Filled 2017-01-31: qty 1100

## 2017-01-31 MED ORDER — SODIUM CHLORIDE 0.9 % IR SOLN
Status: DC | PRN
  Administered 2017-01-31: 3000 mL

## 2017-01-31 MED ORDER — ACETAMINOPHEN 10 MG/ML IV SOLN
INTRAVENOUS | Status: AC
  Filled 2017-01-31: qty 100

## 2017-01-31 MED ORDER — WARFARIN SODIUM 2.5 MG PO TABS
7.5000 mg | ORAL_TABLET | Freq: Once | ORAL | Status: AC
  Administered 2017-02-01: 7.5 mg via ORAL
  Filled 2017-01-31: qty 1

## 2017-01-31 MED ORDER — ROCURONIUM BROMIDE 10 MG/ML (PF) SYRINGE
PREFILLED_SYRINGE | INTRAVENOUS | Status: DC | PRN
  Administered 2017-01-31: 40 mg via INTRAVENOUS
  Administered 2017-01-31 (×4): 10 mg via INTRAVENOUS

## 2017-01-31 MED ORDER — MAGNESIUM CITRATE PO SOLN: 1.0000 | Freq: Once | ORAL | Status: DC | PRN

## 2017-01-31 MED ORDER — LACTATED RINGERS IV SOLN
INTRAVENOUS | Status: DC | PRN
  Administered 2017-01-31: 15:00:00 via INTRAVENOUS

## 2017-01-31 MED ORDER — FENTANYL CITRATE (PF) 100 MCG/2ML IJ SOLN
25.0000 ug | INTRAMUSCULAR | Status: DC | PRN
  Administered 2017-01-31: 25 ug via INTRAVENOUS
  Administered 2017-01-31: 50 ug via INTRAVENOUS

## 2017-01-31 MED ORDER — DEXAMETHASONE SODIUM PHOSPHATE 10 MG/ML IJ SOLN
INTRAMUSCULAR | Status: DC | PRN
  Administered 2017-01-31: 10 mg via INTRAVENOUS

## 2017-01-31 MED ORDER — WARFARIN SODIUM 5 MG PO TABS
10.0000 mg | ORAL_TABLET | ORAL | Status: DC
  Administered 2017-02-02: 10 mg via ORAL
  Filled 2017-01-31: qty 2

## 2017-01-31 MED ORDER — SODIUM CHLORIDE 0.9 % IV SOLN
INTRAVENOUS | Status: DC
  Administered 2017-01-31: 19:00:00 via INTRAVENOUS

## 2017-01-31 MED ORDER — METHOCARBAMOL 1000 MG/10ML IJ SOLN
500.0000 mg | Freq: Once | INTRAMUSCULAR | Status: AC
  Administered 2017-01-31: 500 mg via INTRAVENOUS
  Filled 2017-01-31: qty 550

## 2017-01-31 MED ORDER — METOCLOPRAMIDE HCL 5 MG PO TABS: 5.0000 mg | ORAL_TABLET | Freq: Three times a day (TID) | ORAL | Status: DC | PRN

## 2017-01-31 MED ORDER — FERROUS SULFATE 325 (65 FE) MG PO TABS
325.0000 mg | ORAL_TABLET | Freq: Three times a day (TID) | ORAL | Status: DC
  Administered 2017-02-01 – 2017-02-03 (×6): 325 mg via ORAL
  Filled 2017-01-31 (×6): qty 1

## 2017-01-31 MED ORDER — ALUM & MAG HYDROXIDE-SIMETH 200-200-20 MG/5ML PO SUSP: 15.0000 mL | ORAL | Status: DC | PRN

## 2017-01-31 MED ORDER — SUGAMMADEX SODIUM 200 MG/2ML IV SOLN
INTRAVENOUS | Status: DC | PRN
  Administered 2017-01-31: 190 mg via INTRAVENOUS

## 2017-01-31 MED ORDER — DEXAMETHASONE SODIUM PHOSPHATE 10 MG/ML IJ SOLN
INTRAMUSCULAR | Status: AC
  Filled 2017-01-31: qty 1

## 2017-01-31 MED ORDER — HYDROXYZINE HCL 10 MG PO TABS
10.0000 mg | ORAL_TABLET | Freq: Three times a day (TID) | ORAL | Status: DC | PRN
  Filled 2017-01-31: qty 1

## 2017-01-31 MED ORDER — DOCUSATE SODIUM 100 MG PO CAPS
100.0000 mg | ORAL_CAPSULE | Freq: Two times a day (BID) | ORAL | Status: DC
  Administered 2017-02-01 – 2017-02-02 (×4): 100 mg via ORAL
  Filled 2017-01-31 (×4): qty 1

## 2017-01-31 MED ORDER — HYDROMORPHONE HCL-NACL 0.5-0.9 MG/ML-% IV SOSY
0.5000 mg | PREFILLED_SYRINGE | INTRAVENOUS | Status: DC | PRN
  Filled 2017-01-31: qty 1

## 2017-01-31 MED ORDER — PROPOFOL 10 MG/ML IV BOLUS
INTRAVENOUS | Status: AC
  Filled 2017-01-31: qty 20

## 2017-01-31 MED ORDER — ONDANSETRON HCL 4 MG/2ML IJ SOLN
INTRAMUSCULAR | Status: AC
  Filled 2017-01-31: qty 2

## 2017-01-31 MED ORDER — METOCLOPRAMIDE HCL 5 MG/ML IJ SOLN
5.0000 mg | Freq: Three times a day (TID) | INTRAMUSCULAR | Status: DC | PRN
  Filled 2017-01-31: qty 2

## 2017-01-31 MED ORDER — CEFAZOLIN SODIUM-DEXTROSE 2-4 GM/100ML-% IV SOLN
2.0000 g | Freq: Four times a day (QID) | INTRAVENOUS | Status: AC
  Administered 2017-01-31 (×2): 2 g via INTRAVENOUS

## 2017-01-31 MED ORDER — SUCCINYLCHOLINE CHLORIDE 200 MG/10ML IV SOSY
PREFILLED_SYRINGE | INTRAVENOUS | Status: DC | PRN
  Administered 2017-01-31: 100 mg via INTRAVENOUS

## 2017-01-31 MED ORDER — LACTATED RINGERS IV SOLN: INTRAVENOUS | Status: DC

## 2017-01-31 MED ORDER — ONDANSETRON HCL 4 MG/2ML IJ SOLN
INTRAMUSCULAR | Status: DC | PRN
  Administered 2017-01-31: 4 mg via INTRAVENOUS

## 2017-01-31 MED ORDER — WARFARIN - PHYSICIAN DOSING INPATIENT: Freq: Every day | Status: DC

## 2017-01-31 MED ORDER — DEXAMETHASONE SODIUM PHOSPHATE 10 MG/ML IJ SOLN
10.0000 mg | Freq: Once | INTRAMUSCULAR | Status: AC
  Administered 2017-02-01: 10 mg via INTRAVENOUS
  Filled 2017-01-31: qty 1

## 2017-01-31 MED ORDER — ACETAMINOPHEN 10 MG/ML IV SOLN
INTRAVENOUS | Status: DC | PRN
  Administered 2017-01-31: 1000 mg via INTRAVENOUS

## 2017-01-31 MED ORDER — MEPERIDINE HCL 50 MG/ML IJ SOLN: 6.2500 mg | INTRAMUSCULAR | Status: DC | PRN

## 2017-01-31 MED ORDER — BISACODYL 10 MG RE SUPP
10.0000 mg | Freq: Every day | RECTAL | Status: DC | PRN
  Filled 2017-01-31: qty 1

## 2017-01-31 MED ORDER — PROMETHAZINE HCL 25 MG/ML IJ SOLN: 6.2500 mg | INTRAMUSCULAR | Status: DC | PRN

## 2017-01-31 SURGICAL SUPPLY — 66 items
BAG SPEC THK2 15X12 ZIP CLS (MISCELLANEOUS) ×1
BAG ZIPLOCK 12X15 (MISCELLANEOUS) ×2 IMPLANT
BALL HIP CERAMIC (Hips) IMPLANT
BLADE SAW SGTL 11.0X1.19X90.0M (BLADE) IMPLANT
CABLE ASSY CERCLAGE SST 1.8X55 (Orthopedic Implant) ×2 IMPLANT
CABLE CERLAGE W/CRIMP 1.8 (Cable) ×5 IMPLANT
COVER SURGICAL LIGHT HANDLE (MISCELLANEOUS) ×2 IMPLANT
DRAPE INCISE IOBAN 66X45 STRL (DRAPES) ×2 IMPLANT
DRAPE LG THREE QUARTER DISP (DRAPES) ×3 IMPLANT
DRAPE ORTHO SPLIT 77X108 STRL (DRAPES) ×4
DRAPE POUCH INSTRU U-SHP 10X18 (DRAPES) ×2 IMPLANT
DRAPE SURG 17X11 SM STRL (DRAPES) ×2 IMPLANT
DRAPE SURG ORHT 6 SPLT 77X108 (DRAPES) ×2 IMPLANT
DRAPE U-SHAPE 47X51 STRL (DRAPES) ×2 IMPLANT
DRSG EMULSION OIL 3X16 NADH (GAUZE/BANDAGES/DRESSINGS) ×2 IMPLANT
DRSG MEPILEX BORDER 4X12 (GAUZE/BANDAGES/DRESSINGS) ×2 IMPLANT
DRSG MEPILEX BORDER 4X8 (GAUZE/BANDAGES/DRESSINGS) ×1 IMPLANT
DRSG PAD ABDOMINAL 8X10 ST (GAUZE/BANDAGES/DRESSINGS) ×4 IMPLANT
DURAPREP 26ML APPLICATOR (WOUND CARE) ×2 IMPLANT
ELECT BLADE TIP CTD 4 INCH (ELECTRODE) ×2 IMPLANT
ELECT REM PT RETURN 15FT ADLT (MISCELLANEOUS) ×2 IMPLANT
EVACUATOR 1/8 PVC DRAIN (DRAIN) ×2 IMPLANT
FACESHIELD WRAPAROUND (MASK) IMPLANT
FACESHIELD WRAPAROUND OR TEAM (MASK) ×4 IMPLANT
GAUZE SPONGE 4X4 12PLY STRL (GAUZE/BANDAGES/DRESSINGS) ×2 IMPLANT
GLOVE BIOGEL M 7.0 STRL (GLOVE) IMPLANT
GLOVE BIOGEL PI IND STRL 7.5 (GLOVE) ×1 IMPLANT
GLOVE BIOGEL PI IND STRL 8.5 (GLOVE) ×1 IMPLANT
GLOVE BIOGEL PI INDICATOR 7.5 (GLOVE) ×1
GLOVE BIOGEL PI INDICATOR 8.5 (GLOVE) ×1
GLOVE ECLIPSE 8.0 STRL XLNG CF (GLOVE) IMPLANT
GLOVE ORTHO TXT STRL SZ7.5 (GLOVE) ×1 IMPLANT
GLOVE SURG ORTHO 8.0 STRL STRW (GLOVE) ×1 IMPLANT
GOWN STRL REUS W/TWL LRG LVL3 (GOWN DISPOSABLE) ×2 IMPLANT
GOWN STRL REUS W/TWL XL LVL3 (GOWN DISPOSABLE) ×4 IMPLANT
HANDPIECE INTERPULSE COAX TIP (DISPOSABLE) ×2
HIP BALL CERAMIC (Hips) ×2 IMPLANT
IMMOBILIZER KNEE 20 (SOFTGOODS) ×2
IMMOBILIZER KNEE 20 THIGH 36 (SOFTGOODS) IMPLANT
KIT BASIN OR (CUSTOM PROCEDURE TRAY) ×2 IMPLANT
MANIFOLD NEPTUNE II (INSTRUMENTS) ×2 IMPLANT
NS IRRIG 1000ML POUR BTL (IV SOLUTION) ×4 IMPLANT
PACK TOTAL JOINT (CUSTOM PROCEDURE TRAY) ×2 IMPLANT
PASSER SUT SWANSON 36MM LOOP (INSTRUMENTS) IMPLANT
POSITIONER SURGICAL ARM (MISCELLANEOUS) ×2 IMPLANT
SET HNDPC FAN SPRY TIP SCT (DISPOSABLE) IMPLANT
SPONGE LAP 18X18 X RAY DECT (DISPOSABLE) ×2 IMPLANT
SPONGE LAP 4X18 X RAY DECT (DISPOSABLE) ×1 IMPLANT
STAPLER SKIN PROX WIDE 3.9 (STAPLE) ×4 IMPLANT
STAPLER VISISTAT 35W (STAPLE) ×3 IMPLANT
STEM FEM BOWED LG 10 16.5 (Orthopedic Implant) ×1 IMPLANT
STRIP CLOSURE SKIN 1/2X4 (GAUZE/BANDAGES/DRESSINGS) IMPLANT
SUCTION FRAZIER HANDLE 10FR (MISCELLANEOUS)
SUCTION TUBE FRAZIER 10FR DISP (MISCELLANEOUS) ×1 IMPLANT
SUT ETHIBOND NAB CT1 #1 30IN (SUTURE) IMPLANT
SUT MNCRL AB 3-0 PS2 18 (SUTURE) ×1 IMPLANT
SUT MNCRL AB 4-0 PS2 18 (SUTURE) IMPLANT
SUT STRATAFIX 0 PDS 27 VIOLET (SUTURE) ×2
SUT VIC AB 1 CT1 36 (SUTURE) ×6 IMPLANT
SUT VIC AB 2-0 CT1 27 (SUTURE) ×8
SUT VIC AB 2-0 CT1 TAPERPNT 27 (SUTURE) ×3 IMPLANT
SUTURE STRATFX 0 PDS 27 VIOLET (SUTURE) IMPLANT
TOWEL OR 17X26 10 PK STRL BLUE (TOWEL DISPOSABLE) ×4 IMPLANT
TRAY FOLEY BAG SILVER LF 16FR (CATHETERS) ×1 IMPLANT
TRAY FOLEY W/METER SILVER 16FR (SET/KITS/TRAYS/PACK) ×1 IMPLANT
WATER STERILE IRR 1500ML POUR (IV SOLUTION) ×2 IMPLANT

## 2017-01-31 NOTE — Progress Notes (Signed)
Patient ID: Alicia Vasquez, female   DOB: 07/11/1940, 76 y.o.   MRN: [id number]  Left peri-prosthetic femur fracture AFVSS  Hgb - 10 INR normal  NPO To OR today for ORIF and revision of left femur fracture with revision of femoral component Consent ordered

## 2017-01-31 NOTE — Transfer of Care (Signed)
Immediate Anesthesia Transfer of Care Note  Patient: Alicia Vasquez  Procedure(s) Performed: Procedure(s) with comments: REVISION and OPEN REDUCTION INTERNAL FIXATION (ORIF) PERIPROSTHETIC FRACTURE LEFT HIP (Left) - 120 mins  Patient Location: PACU  Anesthesia Type:General  Level of Consciousness:  sedated, patient cooperative and responds to stimulation  Airway & Oxygen Therapy:Patient Spontanous Breathing and Patient connected to face mask oxgen  Post-op Assessment:  Report given to PACU RN and Post -op Vital signs reviewed and stable  Post vital signs:  Reviewed and stable  Last Vitals:  Vitals:   01/31/17 1800 01/31/17 1801  BP:  (!) (P) 158/118  Pulse:    Resp: (P) 16 (P) 20  Temp:  36.5 C  SpO2:  (P) 00%    Complications: No apparent anesthesia complications

## 2017-01-31 NOTE — Anesthesia Preprocedure Evaluation (Signed)
Anesthesia Evaluation  Patient identified by MRN, date of birth, ID band Patient awake    History of Anesthesia Complications (+) history of anesthetic complications (Hypertension with dental local with epi)  Airway Mallampati: II  TM Distance: >3 FB Neck ROM: Full    Dental  (+) Poor Dentition, Missing, Dental Advisory Given   Pulmonary shortness of breath,    breath sounds clear to auscultation       Cardiovascular hypertension (no longer on medication), (-) angina+ DVT (2013)   Rhythm:Regular Rate:Normal  8/18 ECHO: EF 65-70%, valves oK   Neuro/Psych Anxiety Depression negative neurological ROS     GI/Hepatic negative GI ROS, Neg liver ROS,   Endo/Other  Hypothyroidism Morbid obesity  Renal/GU negative Renal ROS     Musculoskeletal  (+) Arthritis ,   Abdominal (+) + obese,   Peds  Hematology  (+) Blood dyscrasia (10.0), anemia , Coumadin: INR 1.19, last lovenox 2 nights ago   Anesthesia Other Findings Breast cancer: XRT  Reproductive/Obstetrics                             Anesthesia Physical Anesthesia Plan  ASA: III  Anesthesia Plan: General   Post-op Pain Management:    Induction: Intravenous  PONV Risk Score and Plan: 4 or greater and Ondansetron, Dexamethasone, Midazolam and Scopolamine patch - Pre-op  Airway Management Planned: Oral ETT  Additional Equipment:   Intra-op Plan:   Post-operative Plan: Extubation in OR  Informed Consent: I have reviewed the patients History and Physical, chart, labs and discussed the procedure including the risks, benefits and alternatives for the proposed anesthesia with the patient or authorized representative who has indicated his/her understanding and acceptance.   Dental advisory given  Plan Discussed with: CRNA and Surgeon  Anesthesia Plan Comments: (Plan routine monitors, GETA)        Anesthesia Quick Evaluation

## 2017-01-31 NOTE — Anesthesia Postprocedure Evaluation (Signed)
Anesthesia Post Note  Patient: Alicia Vasquez  Procedure(s) Performed: Procedure(s) (LRB): REVISION and OPEN REDUCTION INTERNAL FIXATION (ORIF) PERIPROSTHETIC FRACTURE LEFT HIP (Left)     Patient location during evaluation: PACU Anesthesia Type: General Level of consciousness: sedated, patient cooperative and oriented Pain control: pain improving. Vital Signs Assessment: post-procedure vital signs reviewed and stable Respiratory status: spontaneous breathing, nonlabored ventilation, respiratory function stable and patient connected to nasal cannula oxygen Cardiovascular status: blood pressure returned to baseline and stable Postop Assessment: no signs of nausea or vomiting Anesthetic complications: no    Last Vitals:  Vitals:   01/31/17 1915 01/31/17 1939  BP:  134/76  Pulse:  92  Resp:  12  Temp: 36.6 C 36.7 C  SpO2:  100%    Last Pain:  Vitals:   01/31/17 1915  TempSrc:   PainSc: Asleep      LLE Sensation: Full sensation;No numbness;No tingling (01/31/17 1939)          Seleta Rhymes. Fidel Caggiano

## 2017-01-31 NOTE — Anesthesia Procedure Notes (Signed)
Procedure Name: Intubation Date/Time: 01/31/2017 11:48 AM Performed by: Anne Fu Pre-anesthesia Checklist: Patient identified, Emergency Drugs available, Suction available, Patient being monitored and Timeout performed Patient Re-evaluated:Patient Re-evaluated prior to induction Oxygen Delivery Method: Circle system utilized Preoxygenation: Pre-oxygenation with 100% oxygen Induction Type: IV induction Ventilation: Mask ventilation without difficulty Laryngoscope Size: Mac and 3 Grade View: Grade I Tube type: Oral Tube size: 7.0 mm Number of attempts: 1 Airway Equipment and Method: Video-laryngoscopy and Rigid stylet Placement Confirmation: ETT inserted through vocal cords under direct vision,  positive ETCO2 and breath sounds checked- equal and bilateral Secured at: 19 cm Tube secured with: Tape Dental Injury: Teeth and Oropharynx as per pre-operative assessment

## 2017-02-01 ENCOUNTER — Inpatient Hospital Stay (HOSPITAL_COMMUNITY): Payer: Medicare Other

## 2017-02-01 ENCOUNTER — Encounter (HOSPITAL_COMMUNITY): Payer: Self-pay | Admitting: Orthopedic Surgery

## 2017-02-01 LAB — BASIC METABOLIC PANEL
ANION GAP: 5 (ref 5–15)
BUN: 17 mg/dL (ref 6–20)
CALCIUM: 10 mg/dL (ref 8.9–10.3)
CO2: 27 mmol/L (ref 22–32)
Chloride: 105 mmol/L (ref 101–111)
Creatinine, Ser: 0.64 mg/dL (ref 0.44–1.00)
GFR calc Af Amer: >60 mL/min (ref >60)
Glucose, Bld: 145 mg/dL — ABNORMAL HIGH (ref 65–99)
POTASSIUM: 4.8 mmol/L (ref 3.5–5.1)
SODIUM: 137 mmol/L (ref 135–145)

## 2017-02-01 LAB — CBC
HCT: 33.8 % — ABNORMAL LOW (ref 36.0–46.0)
Hemoglobin: 11.1 g/dL — ABNORMAL LOW (ref 12.0–15.0)
MCH: 29.6 pg (ref 26.0–34.0)
MCHC: 32.8 g/dL (ref 30.0–36.0)
MCV: 90.1 fL (ref 78.0–100.0)
PLATELETS: 383 10*3/uL (ref 150–400)
RBC: 3.75 MIL/uL — AB (ref 3.87–5.11)
RDW: 16.1 % — AB (ref 11.5–15.5)
WBC: 14 10*3/uL — AB (ref 4.0–10.5)

## 2017-02-01 LAB — PROTIME-INR
INR: 1.06
PROTHROMBIN TIME: 13.7 s (ref 11.4–15.2)

## 2017-02-01 NOTE — Progress Notes (Signed)
     Subjective: 1 Day Post-Op Procedure(s) (LRB): REVISION and OPEN REDUCTION INTERNAL FIXATION (ORIF) PERIPROSTHETIC FRACTURE LEFT HIP (Left)   Patient reports pain as moderate, pain controlled. No events throughout the night other than having to deal with pain. Planing on SNF upon discharge, but not the one she came from.   Objective:   VITALS:   Vitals:   02/01/17 0200 02/01/17 0600  BP: (!) 154/71 137/62  Pulse: 83   Resp: 16   Temp: 98.1 F (36.7 C) 98.4 F (36.9 C)  SpO2: 100%     Dorsiflexion/Plantar flexion intact Incision: dressing C/D/I No cellulitis present Compartment soft  LABS  Recent Labs  01/30/17 0413 01/31/17 0500 01/31/17 1744 02/01/17 0440  HGB 9.7* 10.0* 12.2 11.1*  HCT 29.8* 31.7* 36.0 33.8*  WBC 8.7 8.7  --  14.0*  PLT 406* 419*  --  383     Recent Labs  01/30/17 0413 01/31/17 0500 01/31/17 1744 02/01/17 0440  NA 138 140 139 137  K 3.9 4.3 4.4 4.8  BUN 18 21*  --  17  CREATININE 0.57 0.71  --  0.64  GLUCOSE 110* 113* 162* 145*     Assessment/Plan: 1 Day Post-Op Procedure(s) (LRB): REVISION and OPEN REDUCTION INTERNAL FIXATION (ORIF) PERIPROSTHETIC FRACTURE LEFT HIP (Left) Maintain foley cath due to difficulties with getting up Advance diet Up with therapy D/C IV fluids Discharge to SNF when ready   West Pugh. Kuuipo Anzaldo   PAC  02/01/2017, 8:27 AM

## 2017-02-01 NOTE — NC FL2 (Signed)
[place] LEVEL OF CARE SCREENING TOOL     IDENTIFICATION  Patient Name: Alicia Vasquez Birthdate: Apr 28, 1941 Sex: female Admission Date (Current Location): 01/26/2017  [facility] and [place] Number:  Herbalist and Address:  [facility],  [place] [address], [place]      Provider Number: [id number]  Attending Physician Name and Address:  Paralee Cancel, MD  Relative Name and Phone Number:       Current Level of Care: Hospital Recommended Level of Care: [place] Prior Approval Number:    Date Approved/Denied:   PASRR Number: [id number]  Discharge Plan: SNF    Current Diagnoses: Patient Active Problem List   Diagnosis Date Noted  . Peri-prosthetic fracture of shaft of femur 01/26/2017  . Pain from bone metastases ([place]) 01/25/2017  . Bone metastases ([place]) 01/25/2017  . Acute lower UTI 01/16/2017  . Endometrial hyperplasia 01/16/2017  . Lytic bone lesion of left femur 01/15/2017  . Hip fracture, pathological ([place]) 01/15/2017  . Lymphedema 09/17/2015  . Long term current use of anticoagulant therapy 08/23/2015  . DVT, lower extremity ([place]) 06/18/2015  . Bilateral knee pain 04/03/2015  . Arm edema 08/28/2014  . Vitamin D deficiency 04/23/2014  . Hyperparathyroidism ([place]) 04/23/2014  . Depression 07/18/2013  . Malignant neoplasm of upper-outer quadrant of right breast in female, estrogen receptor positive ([place]) 07/15/2013  . Pre-op examination 07/05/2013  . Overactive bladder 01/30/2013  . Primary hypercoagulable state ([place]) 12/19/2012  . HTN (hypertension) 09/27/2011  . Hearing loss 09/27/2011  . Seasonal allergies 09/27/2011  . Shortness of breath 08/26/2011  . Hypothyroid 08/26/2011    Orientation RESPIRATION BLADDER Height & Weight     Self, Time, Situation, Place  O2 Indwelling catheter Weight: 93 kg (205 lb) Height:  5\' 4"  (162.6 cm)  BEHAVIORAL SYMPTOMS/MOOD NEUROLOGICAL BOWEL NUTRITION STATUS   Other (Comment) (no behaviors)   Continent (P) Diet  AMBULATORY STATUS COMMUNICATION OF NEEDS Skin   Extensive Assist Verbally Surgical wounds                       Personal Care Assistance Level of Assistance  Bathing, Feeding, Dressing Bathing Assistance: Maximum assistance Feeding assistance: Independent Dressing Assistance: Maximum assistance     Functional Limitations Info  Sight, Hearing, Speech Sight Info: Impaired Hearing Info: Impaired Speech Info: Adequate    SPECIAL CARE FACTORS FREQUENCY  PT (By licensed PT), OT (By licensed OT)     PT Frequency: 5x wk OT Frequency: 5x wk            Contractures Contractures Info: Not present    Additional Factors Info  Code Status, Allergies Code Status Info: Full Code Allergies Info: Anesthetics, Amide, Benadryl Diphenhydramine Hcl, Carbocaine Mepivacaine Hcl, Codeine, Epinephrine, Sulfa Antibiotics, Latex, Tramadol, Penicillins           Current Medications (02/01/2017):  This is the current hospital active medication list Current Facility-Administered Medications  Medication Dose Route Frequency Provider Last Rate Last Dose  . 0.9 %  sodium chloride infusion   Intravenous Continuous Danae Orleans, PA-C 100 mL/hr at 01/31/17 1849    . 0.9 %  sodium chloride infusion   Intravenous Once Anne Fu, CRNA      . alum & mag hydroxide-simeth (MAALOX/MYLANTA) 200-200-20 MG/5ML suspension 15 mL  15 mL Oral Q4H PRN Babish, Matthew, PA-C      . anastrozole (ARIMIDEX) tablet 1 mg  1 mg Oral Daily Danae Orleans,  PA-C   1 mg at 02/01/17 1011  . bisacodyl (DULCOLAX) suppository 10 mg  10 mg Rectal Daily PRN Babish, Matthew, PA-C      . docusate sodium (COLACE) capsule 100 mg  100 mg Oral BID Babish, Matthew, PA-C   100 mg at 02/01/17 1008  . ferrous sulfate tablet 325 mg  325 mg Oral TID PC Danae Orleans, PA-C   325 mg at 02/01/17 7425  . HYDROcodone-acetaminophen (NORCO) 7.5-325 MG per tablet 1-2 tablet  1-2  tablet Oral Q4H Danae Orleans, PA-C   2 tablet at 02/01/17 1008  . HYDROmorphone (DILAUDID) injection 0.5-1 mg  0.5-1 mg Intravenous Q2H PRN Danae Orleans, PA-C      . hydrOXYzine (ATARAX/VISTARIL) tablet 10 mg  10 mg Oral TID PRN Danae Orleans, PA-C      . levothyroxine (SYNTHROID, LEVOTHROID) tablet 125 mcg  125 mcg Oral QAC breakfast Danae Orleans, PA-C   125 mcg at 02/01/17 9563  . magnesium citrate solution 1 Bottle  1 Bottle Oral Once PRN Babish, Matthew, PA-C      . menthol-cetylpyridinium (CEPACOL) lozenge 3 mg  1 lozenge Oral PRN Danae Orleans, PA-C       Or  . phenol (CHLORASEPTIC) mouth spray 1 spray  1 spray Mouth/Throat PRN Danae Orleans, PA-C      . methocarbamol (ROBAXIN) tablet 500 mg  500 mg Oral Q6H PRN Danae Orleans, PA-C   500 mg at 02/01/17 8756  . metoCLOPramide (REGLAN) tablet 5-10 mg  5-10 mg Oral Q8H PRN Danae Orleans, PA-C       Or  . metoCLOPramide (REGLAN) injection 5-10 mg  5-10 mg Intravenous Q8H PRN Babish, Matthew, PA-C      . ondansetron [facility]) tablet 4 mg  4 mg Oral Q6H PRN Danae Orleans, PA-C       Or  . ondansetron [facility]) injection 4 mg  4 mg Intravenous Q6H PRN Danae Orleans, PA-C   4 mg at 01/31/17 2019  . polyethylene glycol (MIRALAX / GLYCOLAX) packet 17 g  17 g Oral BID Danae Orleans, PA-C   17 g at 01/28/17 0910  . [START ON 02/02/2017] warfarin (COUMADIN) tablet 10 mg  10 mg Oral Once per day on Tue Thu Sat Paralee Cancel, MD      . Derrill Memo ON 02/03/2017] warfarin (COUMADIN) tablet 7.5 mg  7.5 mg Oral Once per day on Sun Mon Wed Fri Paralee Cancel, MD      . Warfarin - Physician Dosing Inpatient   Does not apply [id number] Paralee Cancel, MD         Discharge Medications: Please see discharge summary for a list of discharge medications.  Relevant Imaging Results:  Relevant Lab Results:   Additional Information SSN [id number] Pt has upcoming appts at the [place] [facility]. Vaudine Dutan, Randall An, LCSW

## 2017-02-01 NOTE — Evaluation (Signed)
Occupational Therapy Evaluation Patient Details Name: Alicia Vasquez MRN: [id number] DOB: 08-05-1940 Today's Date: 02/01/2017    History of Present Illness Pt admitted s/p L femoral neck pathological fx with posterior THR 01/16/17, Pt is now ORIF and revision L Peri-prosthetic femur fracture with revision of femoral component on 01/31/17.  Pt with hx of breast CA   Clinical Impression   Pt admitted as above demonstrating deficits in ADL's requiring +2 assist and increased time secondary to pain and anxiety (see OT problem list below). She participated in ADL/therapeutic activity retraining session to include bed mobility, transfer EOB to chair in preparation for increased participation w/ ADL's. Pt requires +2Max-total assist for transfers and difficulty maintaining LLE PWB during transfer noted.    Follow Up Recommendations  SNF;Supervision/Assistance - 24 hour    Equipment Recommendations  Other (comment) (Defer to next venue)    Recommendations for Other Services PT consult     Precautions / Restrictions Precautions Precautions: Posterior Hip;Fall Precaution Booklet Issued: Yes (comment) Precaution Comments: Pt recalls 2/3 THP without cues Restrictions Weight Bearing Restrictions: Yes LUE Weight Bearing: Partial weight bearing LLE Weight Bearing: Partial weight bearing LLE Partial Weight Bearing Percentage or Pounds: 50      Mobility Bed Mobility   Bed Mobility: Supine to Sit     Supine to sit: Max assist;+2 for physical assistance;+2 for safety/equipment     General bed mobility comments: Cues for sequence and use of R LE to self assist but with minimal follow through by pt.   Pt assisted to/from EOB with use of pad  Transfers Overall transfer level: Needs assistance Equipment used: Rolling walker (2 wheeled) Transfers: Sit to/from [facility] Sit to Stand: Max assist;+2 physical assistance;+2 safety/equipment;From elevated surface Stand pivot  transfers: Max assist;+2 physical assistance;+2 safety/equipment       General transfer comment: Constant cues for PWB LLE & use of UEs to self assist. Benefits from increased time and step by step instruction.    Balance Overall balance assessment: Needs assistance Sitting-balance support: Bilateral upper extremity supported;Feet supported Sitting balance-Leahy Scale: Fair Sitting balance - Comments: Pt tends to lean posteriorly on UE's as she is anticipating pain sitting EOB - able to self correct with max vc's and increased time   Standing balance support: Bilateral upper extremity supported Standing balance-Leahy Scale: Poor Standing balance comment: Max vc's for use of bilateral UE's and RLLE to assist in standing upright. Pt lowers her head and leans on RW multiple times. +2 physical assist for safety and has difficulty maintaining PWB LLE                           ADL either performed or assessed with clinical judgement   ADL Overall ADL's : Needs assistance/impaired Eating/Feeding: Independent   Grooming: Wash/dry hands;Wash/dry face;Sitting;Min guard;Set up   Upper Body Bathing: Set up;Min guard;Sitting   Lower Body Bathing: Sit to/from stand;+2 for physical assistance;Total assistance;+2 for safety/equipment   Upper Body Dressing : Minimal assistance;Sitting   Lower Body Dressing: Total assistance;+2 for physical assistance;Sit to/from stand;+2 for safety/equipment   Toilet Transfer: +2 for physical assistance;Maximal assistance;+2 for safety/equipment;BSC;RW (Simulated transfer (EOB to recliner chair)) Toilet Transfer Details (indicate cue type and reason): Pt with increased anxiety and overall deconditioning. Benefits from increased time for tasks and step by step planning Toileting- Clothing Manipulation and Hygiene: Total assistance;+2 for physical assistance;+2 for safety/equipment;Sit to/from stand  General ADL Comments: Pt was assessed and  then participated in ADL retraining sessionto include EOB to chair transfer in preparation for increased participation w/ ADL's. Pt requires +2Max-total assist for transfers and difficulty maintaining LLE PWB during transfer despite vc's.      Vision Baseline Vision/History: Wears glasses Wears Glasses: At all times Patient Visual Report: No change from baseline       Perception     Praxis      Pertinent Vitals/Pain Pain Assessment: 0-10 Pain Score: 6  Faces Pain Scale: Hurts even more Pain Location: L hip, knee Pain Descriptors / Indicators: Aching;Sore;Grimacing;Guarding Pain Intervention(s): Limited activity within patient's tolerance;Monitored during session;Premedicated before session;Ice applied     Hand Dominance     Extremity/Trunk Assessment Upper Extremity Assessment Upper Extremity Assessment:  (Able to lift bilateral to 90*)   Lower Extremity Assessment Lower Extremity Assessment: Defer to PT evaluation       Communication Communication Communication: HOH   Cognition Arousal/Alertness: Awake/alert Behavior During Therapy: WFL for tasks assessed/performed Overall Cognitive Status: Within Functional Limits for tasks assessed                                     General Comments       Exercises     Shoulder Instructions      Home Living Family/patient expects to be discharged to:: Skilled nursing facility Living Arrangements: Non-relatives/Friends                               Additional Comments: Pt lives with elderly friend      Prior Functioning/Environment Level of Independence: Independent with assistive device(s)        Comments: Pt ambulating with RW prior to surgery but struggling         OT Problem List: Decreased strength;Impaired balance (sitting and/or standing);Decreased knowledge of precautions;Pain;Decreased safety awareness;Decreased activity tolerance;Decreased knowledge of use of DME or AE       OT Treatment/Interventions: Self-care/ADL training;DME and/or AE instruction;Patient/family education;Balance training;Energy conservation    OT Goals(Current goals can be found in the care plan section) Acute Rehab OT Goals Patient Stated Goal: Less pain OT Goal Formulation: With patient Time For Goal Achievement: 02/15/17 Potential to Achieve Goals: Good  OT Frequency: Min 2X/week   Barriers to D/C:            Co-evaluation              AM-PAC PT "6 Clicks" Daily Activity     Outcome Measure Help from another person eating meals?: None Help from another person taking care of personal grooming?: A Little Help from another person toileting, which includes using toliet, bedpan, or urinal?: Total Help from another person bathing (including washing, rinsing, drying)?: A Lot Help from another person to put on and taking off regular upper body clothing?: A Little Help from another person to put on and taking off regular lower body clothing?: Total 6 Click Score: 14   End of Session Equipment Utilized During Treatment: Gait belt;Rolling walker;Oxygen Nurse Communication: Mobility status;Other (comment) (+2 for safety & PWB LLE; increased time for all tasks secondary to anxiety)  Activity Tolerance: Patient tolerated treatment well;Patient limited by fatigue;Patient limited by pain Patient left: in chair;with call bell/phone within reach;with nursing/sitter in room;with family/visitor present  OT Visit Diagnosis: Pain;Muscle weakness (generalized) (M62.81) Pain - Right/Left:  Left Pain - part of body: Hip;Knee                Time: 0920-0959 OT Time Calculation (min): 39 min Charges:  OT General Charges $OT Visit: 1 Visit OT Evaluation $OT Eval Moderate Complexity: 1 Mod OT Treatments $Therapeutic Activity: 8-22 mins G-Codes:     Amy Barnhill, OTR/L 02/01/17 10:19 AM    Barnhill, Amy Ardath Sax

## 2017-02-01 NOTE — Care Management Note (Signed)
Case Management Note  Patient Details  Name: Alicia Vasquez MRN: [id number] Date of Birth: May 19, 1941  Subjective/Objective:76 y/o f admitted w/L hip revision. POD#1 L hip ORIF. From home. CSW following for SNF.PT cons-await recc.                    Action/Plan:d/c plan SNF   Expected Discharge Date:                  Expected Discharge Plan:  Skilled Nursing Facility  In-House Referral:  Clinical Social Work  Discharge planning Services  CM Consult  Post Acute Care Choice:    Choice offered to:     DME Arranged:    DME Agency:     HH Arranged:    [place] Agency:     Status of Service:  In process, will continue to follow  If discussed at Long Length of Stay Meetings, dates discussed:    Additional Comments:  Dessa Phi, RN 02/01/2017, 10:01 AM

## 2017-02-01 NOTE — Brief Op Note (Addendum)
01/26/2017 - 01/31/2017  5:00 PM  PATIENT:  Alicia Vasquez  76 y.o. female  PRE-OPERATIVE DIAGNOSIS:  Left hip periprosthetic fracture  POST-OPERATIVE DIAGNOSIS:  Left hip periprosthetic fracture  PROCEDURE:  Procedure(s) with comments: REVISION and OPEN REDUCTION INTERNAL FIXATION (ORIF) PERIPROSTHETIC FRACTURE LEFT HIP (Left) - 120 mins  SURGEON:  Surgeon(s) and Role:    Paralee Cancel, MD - Primary  PHYSICIAN ASSISTANT: Danae Orleans, PA-C  ANESTHESIA:   general  EBL:  Total I/O In: 120 [P.O.:120] Out: 150 [Urine:150]  600cc  BLOOD ADMINISTERED: 2 units PRBCs administered during case  DRAINS: none   LOCAL MEDICATIONS USED:  NONE  SPECIMEN:  No Specimen  DISPOSITION OF SPECIMEN:  N/A  COUNTS:  YES  TOURNIQUET:  * No tourniquets in log *  DICTATION: .Other Dictation: Dictation Number [id number]  PLAN OF CARE: Admit to inpatient   PATIENT DISPOSITION:  PACU - hemodynamically stable.   Delay start of Pharmacological VTE agent (>24hrs) due to surgical blood loss or risk of bleeding: no

## 2017-02-01 NOTE — Evaluation (Signed)
Physical Therapy Evaluation Patient Details Name: Alicia Vasquez MRN: [id number] DOB: 05-29-1941 Today's Date: 02/01/2017   History of Present Illness  Pt admitted s/p L femoral neck pathological fx with posterior THR 01/16/17, Pt is now ORIF and revision L Peri-prosthetic femur fracture with revision of femoral component on 01/31/17.  Pt with hx of breast CA  Clinical Impression   The  Patient is requiring extensive assistance for mobility. Plans to return to SNF.Pt admitted with above diagnosis. Pt currently with functional limitations due to the deficits listed below (see PT Problem List).  Pt will benefit from skilled PT to increase their independence and safety with mobility to allow discharge to the venue listed below.       Follow Up Recommendations SNF    Equipment Recommendations  None recommended by PT    Recommendations for Other Services       Precautions / Restrictions Precautions Precautions: Posterior Hip;Fall   Required Braces or Orthoses: Knee Immobilizer - Left Knee Immobilizer - Left: On at all times Restrictions LLE Weight Bearing: Partial weight bearing LLE Partial Weight Bearing Percentage or Pounds: 50      Mobility  Bed Mobility   Bed Mobility: Sit to Supine       Sit to supine: +2 for physical assistance;+2 for safety/equipment;Total assist   General bed mobility comments:   Nursing had patient on the [facility]. Assist with both legs back onto bed, assist with trunk  Transfers Overall transfer level: Needs assistance Equipment used: Rolling walker (2 wheeled) Transfers: Sit to/from Stand Sit to Stand: Total assist;+2 safety/equipment;From elevated surface;+2 physical assistance         General transfer comment: assist to stand  from [facility] x 2 for pericare with total assistance. The  bed was brought up to sit down.   Ambulation/Gait                Stairs            Wheelchair Mobility    Modified Rankin (Stroke Patients Only)       Balance Overall balance assessment: Needs assistance Sitting-balance support: Bilateral upper extremity supported;Feet supported Sitting balance-Leahy Scale: Poor     Standing balance support: Bilateral upper extremity supported;During functional activity Standing balance-Leahy Scale: Poor                               Pertinent Vitals/Pain Pain Assessment: Faces Faces Pain Scale: Hurts worst Pain Location: L hip, knee Pain Descriptors / Indicators: Aching;Sore;Grimacing;Guarding;Cramping;Crying Pain Intervention(s): Monitored during session;Repositioned;Limited activity within patient's tolerance    Home Living Family/patient expects to be discharged to:: Skilled nursing facility Living Arrangements: Non-relatives/Friends                    Prior Function           Comments: nonambulatory last admit     Hand Dominance        Extremity/Trunk Assessment   Upper Extremity Assessment Upper Extremity Assessment: Defer to OT evaluation    Lower Extremity Assessment Lower Extremity Assessment: LLE deficits/detail LLE Deficits / Details: ankle dorsiflexion intact, requires assistance to mopve the leg.    Cervical / Trunk Assessment Cervical / Trunk Assessment: Kyphotic  Communication      Cognition Arousal/Alertness: Awake/alert Behavior During Therapy: Anxious Overall Cognitive Status: Within Functional Limits for tasks assessed  General Comments      Exercises     Assessment/Plan    PT Assessment Patient needs continued PT services  PT Problem List Decreased strength;Decreased range of motion;Decreased activity tolerance;Decreased balance;Decreased mobility;Decreased knowledge of use of DME;Obesity;Pain       PT Treatment Interventions Functional mobility training;Therapeutic activities;Patient/family education;DME instruction    PT Goals (Current goals can be found in the  Care Plan section)  Acute Rehab PT Goals Patient Stated Goal: Less pain PT Goal Formulation: With patient Time For Goal Achievement: 02/15/17 Potential to Achieve Goals: Fair    Frequency Min 2X/week   Barriers to discharge        Co-evaluation               AM-PAC PT "6 Clicks" Daily Activity  Outcome Measure Difficulty turning over in bed (including adjusting bedclothes, sheets and blankets)?: Unable Difficulty moving from lying on back to sitting on the side of the bed? : Unable Difficulty sitting down on and standing up from a chair with arms (e.g., wheelchair, bedside commode, etc,.)?: Unable Help needed moving to and from a bed to chair (including a wheelchair)?: Total Help needed walking in hospital room?: Total Help needed climbing 3-5 steps with a railing? : Total 6 Click Score: 6    End of Session Equipment Utilized During Treatment: Gait belt Activity Tolerance: Patient limited by pain Patient left: in bed;with call bell/phone within reach;with family/visitor present;with nursing/sitter in room Nurse Communication: Mobility status PT Visit Diagnosis: Pain;Other (comment) Pain - Right/Left: Left Pain - part of body: Hip;Leg    Time: 3300-7622 PT Time Calculation (min) (ACUTE ONLY): 27 min   Charges:   PT Evaluation $PT Eval Low Complexity: 1 Low PT Treatments $Therapeutic Activity: 8-22 mins   PT G CodesTresa Endo PT [phone]   Claretha Cooper 02/01/2017, 4:14 PM

## 2017-02-01 NOTE — Progress Notes (Signed)
CSW assisting with dc planning. [facility] will have an opening for pt ( when stable for dc ( Thursday or Friday ). CSW will continue to follow to assist with dc planning needs.  Werner Lean LCSW [phone]

## 2017-02-02 LAB — BASIC METABOLIC PANEL
Anion gap: 6 (ref 5–15)
BUN: 14 mg/dL (ref 6–20)
CALCIUM: 10.2 mg/dL (ref 8.9–10.3)
CO2: 28 mmol/L (ref 22–32)
CREATININE: 0.59 mg/dL (ref 0.44–1.00)
Chloride: 106 mmol/L (ref 101–111)
GFR calc Af Amer: >60 mL/min (ref >60)
GLUCOSE: 119 mg/dL — AB (ref 65–99)
Potassium: 4.2 mmol/L (ref 3.5–5.1)
Sodium: 140 mmol/L (ref 135–145)

## 2017-02-02 LAB — CBC
HEMATOCRIT: 28.6 % — AB (ref 36.0–46.0)
Hemoglobin: 9.4 g/dL — ABNORMAL LOW (ref 12.0–15.0)
MCH: 29.8 pg (ref 26.0–34.0)
MCHC: 32.9 g/dL (ref 30.0–36.0)
MCV: 90.8 fL (ref 78.0–100.0)
Platelets: 342 10*3/uL (ref 150–400)
RBC: 3.15 MIL/uL — ABNORMAL LOW (ref 3.87–5.11)
RDW: 16 % — AB (ref 11.5–15.5)
WBC: 14.8 10*3/uL — ABNORMAL HIGH (ref 4.0–10.5)

## 2017-02-02 LAB — URINE CULTURE

## 2017-02-02 LAB — PROTIME-INR
INR: 1.41
Prothrombin Time: 17.2 seconds — ABNORMAL HIGH (ref 11.4–15.2)

## 2017-02-02 MED ORDER — DOCUSATE SODIUM 100 MG PO CAPS: 100.0000 mg | ORAL_CAPSULE | Freq: Two times a day (BID) | ORAL | 0 refills | Status: DC

## 2017-02-02 MED ORDER — HYDROCODONE-ACETAMINOPHEN 7.5-325 MG PO TABS: 1.0000 | ORAL_TABLET | ORAL | 0 refills | Status: DC | PRN

## 2017-02-02 MED ORDER — METHOCARBAMOL 500 MG PO TABS: 500.0000 mg | ORAL_TABLET | Freq: Four times a day (QID) | ORAL | 0 refills | Status: DC | PRN

## 2017-02-02 MED ORDER — POLYETHYLENE GLYCOL 3350 17 G PO PACK: 17.0000 g | PACK | Freq: Two times a day (BID) | ORAL | 0 refills | Status: DC

## 2017-02-02 NOTE — Progress Notes (Signed)
Patient is adamantly refuses to wear TED hose, SCD hose at this time. Patient/Famiyl educated on the rationale for these items.

## 2017-02-02 NOTE — Progress Notes (Signed)
PT Cancellation Note  Patient Details Name: Alicia Vasquez MRN: [id number] DOB: Nov 11, 1940   Cancelled Treatment:     PT deferred this date.  X-ray report from late yesterday showing "New intertrochanteric fx of proximal L femur".  Will follow and proceed dependent on clarification by physician.   Shoshanah Dapper 02/02/2017, 2:07 PM

## 2017-02-02 NOTE — Op Note (Signed)
NAME:  Alicia Vasquez, Alicia Vasquez                     ACCOUNT NO.:  MEDICAL RECORD NO.:  [id number]  LOCATION:                                 FACILITY:  PHYSICIAN:  Pietro Cassis. Alvan Dame, M.D.  DATE OF BIRTH:  07/22/1940  DATE OF PROCEDURE:  01/31/2017 DATE OF DISCHARGE:                              OPERATIVE REPORT   PREOPERATIVE DIAGNOSIS:  Left periprosthetic femur fracture.  POSTOPERATIVE DIAGNOSIS:  Left periprosthetic femur fracture.  PROCEDURE:  Open reduction and internal fixation with revision of the left femoral component for fracture.  COMPONENTS USED:  A 7 Zimmer cables and a size 16.5 larger stature 10-inch bowed left Solution stem, a 32+ 5 Delta ceramic ball.  SURGEON:  Pietro Cassis. Alvan Dame, M.D.  ASSISTANT:  Danae Orleans, PA-C.  ANESTHESIA:  General.  BLOOD LOSS:  About 600 mL.  FLUIDS:  She did get 2 units of packed red blood cells during the case due to starting hemoglobin of 10.  DRAINS:  None.  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  She was taken to the operating room about 2-3 weeks ago at this point for a total hip arthroplasty performed with an AML stem based on the proximal bone loss.  She had gone to rehab.  She was noted to have an increasing pain with physical therapy there and was sent back to the office for evaluation, where radiographs revealed a periprosthetic fracture distally.  She was subsequently admitted to the hospital and prepared for surgery.  All risks, benefits and indication of procedure obviously were discussed with her, plan was reviewed.  Consent was obtained after reviewing the risks of recurrent fracture, need for future surgeries, infection, DVT, dislocation.  Consent obtained.  PROCEDURE IN DETAIL:  The patient was brought to the operative theater. Once adequate anesthesia,  preoperative antibiotics, Ancef administered, she was positioned in the right lateral decubitus position with left side up.  The left lower extremity was then prepped and draped in sterile fashion.  A time-out was performed identifying the patient, planned procedure and extremity.  Her old incision was identified and extended for distal approach to the femur.  The initial exposure obtained in the iliotibial band and gluteal fascia, which was then split.  I first attended to the femoral component onto the femur itself, elevated the vastus lateralis off the posterior intermuscular septum and retracted anteriorly exposing the fracture site.  At this point, I placed three provisional cables onto the proximal aspect of the femur.  Once this was done, we exposed the posterior aspect of the hip and dislocated the hip itself.  The femoral component was cold out of the femur without complication.  At this point, I re-reamed the femur starting with the 57mm reamer up to the 13mm reamer to get purchase distally.  Once this had good purchase, I initially did a trial reduction with an 16.5 8-inch straight stem.  We brought the portable x-ray into the room and identified that this 8-inch stem could not pass the fracture site  far Enough to provide acceptable fixation.  For that reason, I selected the 10-inch bowed stem.  Based on the preparation of bone, I went ahead and selected the 16.5 large stature 10-inch bowed left stem.  This stem was then impacted and had good purchase.  We did the trial reduction again and did the another portable x-ray and found that the stem bypassed the fracture site and did not appear to have any complication.  The hip was stable with 36+ 5 ball.  I selected this final ceramic ball and impacted on the clean and dry trunnion and reduced the hip.  At this point, I exposed distally with intention of putting a cable to reduce stress on the bone distal to this long stem,  but identified that there was a longitudinal split in the cortex.  Based on these findings I placed three other cables distal, one distal to the tip of this prosthesis and two over the area of the split for now a total of seven cables.  Each of these cables were now tensioned down appropriately, crimped and cut.  We irrigated the hip with pulse lavage throughout the case.  Again, at this point, the iliotibial band was reapproximated over the vastus lateralis using #1 Vicryl sutures and 0 Stratafix to the entire extent. The remainder of the wound was closed with 2-0 Vicryl and staples on the skin.  The skin was cleaned, dried and dressed sterilely using the long Mepilex dressings.  She was then brought to the recovery room in stable condition, tolerating the procedure well.  Probably, we will limit her weightbearing to partial weightbearing until we get adequate healing. Findings were reviewed with family.     Pietro Cassis Alvan Dame, M.D.     MDO/MEDQ  D:  02/01/2017  T:  02/02/2017  Job:  [id number]

## 2017-02-02 NOTE — Progress Notes (Signed)
Per Adrian Prince, PA. Pt to continue with PWB status.

## 2017-02-02 NOTE — Progress Notes (Signed)
     Subjective: 2 Days Post-Op Procedure(s) (LRB): REVISION and OPEN REDUCTION INTERNAL FIXATION (ORIF) PERIPROSTHETIC FRACTURE LEFT HIP (Left)   Patient reports pain as moderate, discussed different medications but she can't tolerate any others. She is having a lot of difficulties with weight bearing, needing 4 people to help her up.  We have discussed her generalized weakness as well as healing form the surgery.    Objective:   VITALS:   Vitals:   02/01/17 2136 02/02/17 0530  BP: (!) 116/52 (!) 120/45  Pulse: 83 93  Resp: 20 20  Temp: 98.3 F (36.8 C) 98.2 F (36.8 C)  SpO2: 97% 99%    Dorsiflexion/Plantar flexion intact Incision: dressing C/D/I No cellulitis present Compartment soft  LABS  Recent Labs  01/31/17 0500 01/31/17 1744 02/01/17 0440 02/02/17 0545  HGB 10.0* 12.2 11.1* 9.4*  HCT 31.7* 36.0 33.8* 28.6*  WBC 8.7  --  14.0* 14.8*  PLT 419*  --  383 342     Recent Labs  01/31/17 0500 01/31/17 1744 02/01/17 0440 02/02/17 0545  NA 140 139 137 140  K 4.3 4.4 4.8 4.2  BUN 21*  --  17 14  CREATININE 0.71  --  0.64 0.59  GLUCOSE 113* 162* 145* 119*     Assessment/Plan: 2 Days Post-Op Procedure(s) (LRB): REVISION and OPEN REDUCTION INTERNAL FIXATION (ORIF) PERIPROSTHETIC FRACTURE LEFT HIP (Left)  Up with therapy Discharge to SNF when ready, possibly tomorrow.   Alicia Vasquez   PAC  02/02/2017, 7:26 AM

## 2017-02-02 NOTE — Plan of Care (Signed)
Problem: Activity: Goal: Ability to ambulate and perform ADLs will improve Outcome: Not Progressing Pt complains that her pain is to much for her to mobilize. Encouraged q4hr pain medication and Ice packs.

## 2017-02-03 ENCOUNTER — Telehealth: Payer: Self-pay | Admitting: Family Medicine

## 2017-02-03 DIAGNOSIS — Z96643 Presence of artificial hip joint, bilateral: Secondary | ICD-10-CM | POA: Diagnosis not present

## 2017-02-03 DIAGNOSIS — M25562 Pain in left knee: Secondary | ICD-10-CM | POA: Diagnosis not present

## 2017-02-03 DIAGNOSIS — C7951 Secondary malignant neoplasm of bone: Secondary | ICD-10-CM | POA: Diagnosis not present

## 2017-02-03 DIAGNOSIS — S79911A Unspecified injury of right hip, initial encounter: Secondary | ICD-10-CM | POA: Diagnosis not present

## 2017-02-03 DIAGNOSIS — T84011D Broken internal left hip prosthesis, subsequent encounter: Secondary | ICD-10-CM | POA: Diagnosis not present

## 2017-02-03 DIAGNOSIS — R600 Localized edema: Secondary | ICD-10-CM | POA: Diagnosis not present

## 2017-02-03 DIAGNOSIS — E039 Hypothyroidism, unspecified: Secondary | ICD-10-CM | POA: Diagnosis not present

## 2017-02-03 DIAGNOSIS — Z853 Personal history of malignant neoplasm of breast: Secondary | ICD-10-CM | POA: Diagnosis not present

## 2017-02-03 DIAGNOSIS — M9702XD Periprosthetic fracture around internal prosthetic left hip joint, subsequent encounter: Secondary | ICD-10-CM | POA: Diagnosis not present

## 2017-02-03 DIAGNOSIS — G893 Neoplasm related pain (acute) (chronic): Secondary | ICD-10-CM | POA: Diagnosis not present

## 2017-02-03 DIAGNOSIS — Z87440 Personal history of urinary (tract) infections: Secondary | ICD-10-CM | POA: Diagnosis not present

## 2017-02-03 DIAGNOSIS — Z96649 Presence of unspecified artificial hip joint: Secondary | ICD-10-CM | POA: Diagnosis not present

## 2017-02-03 DIAGNOSIS — R300 Dysuria: Secondary | ICD-10-CM | POA: Diagnosis not present

## 2017-02-03 DIAGNOSIS — M898X5 Other specified disorders of bone, thigh: Secondary | ICD-10-CM | POA: Diagnosis not present

## 2017-02-03 DIAGNOSIS — F418 Other specified anxiety disorders: Secondary | ICD-10-CM | POA: Diagnosis not present

## 2017-02-03 DIAGNOSIS — I10 Essential (primary) hypertension: Secondary | ICD-10-CM | POA: Diagnosis not present

## 2017-02-03 DIAGNOSIS — M25561 Pain in right knee: Secondary | ICD-10-CM | POA: Diagnosis not present

## 2017-02-03 DIAGNOSIS — M978XXD Periprosthetic fracture around other internal prosthetic joint, subsequent encounter: Secondary | ICD-10-CM | POA: Diagnosis not present

## 2017-02-03 DIAGNOSIS — M1990 Unspecified osteoarthritis, unspecified site: Secondary | ICD-10-CM | POA: Diagnosis not present

## 2017-02-03 DIAGNOSIS — G8911 Acute pain due to trauma: Secondary | ICD-10-CM | POA: Diagnosis not present

## 2017-02-03 DIAGNOSIS — Z96642 Presence of left artificial hip joint: Secondary | ICD-10-CM | POA: Diagnosis not present

## 2017-02-03 DIAGNOSIS — Z7901 Long term (current) use of anticoagulants: Secondary | ICD-10-CM | POA: Diagnosis not present

## 2017-02-03 DIAGNOSIS — N8500 Endometrial hyperplasia, unspecified: Secondary | ICD-10-CM | POA: Diagnosis not present

## 2017-02-03 DIAGNOSIS — Z4789 Encounter for other orthopedic aftercare: Secondary | ICD-10-CM | POA: Diagnosis not present

## 2017-02-03 DIAGNOSIS — I890 Lymphedema, not elsewhere classified: Secondary | ICD-10-CM | POA: Diagnosis not present

## 2017-02-03 LAB — BPAM RBC
BLOOD PRODUCT EXPIRATION DATE: 201810042359
BLOOD PRODUCT EXPIRATION DATE: 201810042359
Blood Product Expiration Date: 201810042359
Blood Product Expiration Date: 201810042359
ISSUE DATE / TIME: 201809041548
ISSUE DATE / TIME: 201809041548
UNIT TYPE AND RH: 5100
UNIT TYPE AND RH: 5100
UNIT TYPE AND RH: 5100
Unit Type and Rh: 5100

## 2017-02-03 LAB — TYPE AND SCREEN
ABO/RH(D): O POS
ANTIBODY SCREEN: NEGATIVE
UNIT DIVISION: 0
Unit division: 0
Unit division: 0
Unit division: 0

## 2017-02-03 LAB — PROTIME-INR
INR: 1.35
Prothrombin Time: 16.6 seconds — ABNORMAL HIGH (ref 11.4–15.2)

## 2017-02-03 MED ORDER — ENOXAPARIN SODIUM 40 MG/0.4ML ~~LOC~~ SOLN
40.0000 mg | SUBCUTANEOUS | Status: DC
  Administered 2017-02-03: 40 mg via SUBCUTANEOUS
  Filled 2017-02-03: qty 0.4

## 2017-02-03 MED ORDER — ENOXAPARIN SODIUM 40 MG/0.4ML ~~LOC~~ SOLN: 40.0000 mg | SUBCUTANEOUS | 0 refills | Status: DC

## 2017-02-03 NOTE — Telephone Encounter (Signed)
Alicia Vasquez calling in to see if patient has received the flu and pneumonia vaccine. Please call [facility] and advise.

## 2017-02-03 NOTE — Progress Notes (Signed)
Events nopted-- d/c planned for today.  Hubert will continue on anastrozole until we can start her fulvestrant-- she is scheduled for treatment 09/13 in our office and she also sees radiation oncology 09/19. We can change those appointments if mobility or transportatin is a problem but hopefully by then she should be able to be transported.  Please let me know if I can be of further help.

## 2017-02-03 NOTE — Progress Notes (Signed)
     Subjective: 3 Days Post-Op Procedure(s) (LRB): REVISION and OPEN REDUCTION INTERNAL FIXATION (ORIF) PERIPROSTHETIC FRACTURE LEFT HIP (Left)   Patient reports pain as moderate, still having some pain.  States that it does help when she takes the Norco and the Robaxin.  She just recent took her Norco.  She mentions that she still has quite a bit of pain when she has to move.  Objective:   VITALS:   Vitals:   02/02/17 2139 02/03/17 0500  BP: (!) 105/46 (!) 142/54  Pulse: (!) 105 98  Resp: 16 16  Temp: 98.1 F (36.7 C) 98.2 F (36.8 C)  SpO2: 98% 99%    Dorsiflexion/Plantar flexion intact Incision: dressing C/D/I No cellulitis present Compartment soft  LABS  Recent Labs  01/31/17 1744 02/01/17 0440 02/02/17 0545  HGB 12.2 11.1* 9.4*  HCT 36.0 33.8* 28.6*  WBC  --  14.0* 14.8*  PLT  --  383 342     Recent Labs  01/31/17 1744 02/01/17 0440 02/02/17 0545  NA 139 137 140  K 4.4 4.8 4.2  BUN  --  17 14  CREATININE  --  0.64 0.59  GLUCOSE 162* 145* 119*     Assessment/Plan: 3 Days Post-Op Procedure(s) (LRB): REVISION and OPEN REDUCTION INTERNAL FIXATION (ORIF) PERIPROSTHETIC FRACTURE LEFT HIP (Left) Up with therapy Discharge to SNF  Follow up in 2 weeks at [facility]. Follow up with OLIN,Lehua Flores D in 2 weeks.  Contact information:  [facility] [address], [address] [place]   PAC  02/03/2017, 7:47 AM

## 2017-02-03 NOTE — Progress Notes (Signed)
Pt transferred to [facility] at [place]. Pt transferred with all personal belongings and L leg immobilizer. Discharge education complete, sent packet with pt. Pt's support system with pt, to follow transport.

## 2017-02-03 NOTE — Care Management Note (Signed)
Case Management Note  Patient Details  Name: Alicia Vasquez MRN: [id number] Date of Birth: March 14, 1941  Subjective/Objective: CSW managing d/c plan SNF.                   Action/Plan:d/c SNF.   Expected Discharge Date:  02/03/17               Expected Discharge Plan:  Skilled Nursing Facility  In-House Referral:  Clinical Social Work  Discharge planning Services  CM Consult  Post Acute Care Choice:    Choice offered to:     DME Arranged:    DME Agency:     HH Arranged:    [place] Agency:     Status of Service:  Completed, signed off  If discussed at [employer], dates discussed:    Additional Comments:  Dessa Phi, RN 02/03/2017, 9:41 AM

## 2017-02-03 NOTE — Care Management Important Message (Signed)
Important Message  Patient Details  Name: JANNATUL WOJDYLA MRN: [id number] Date of Birth: 07-21-1940   Medicare Important Message Given:  Yes    Kerin Salen 02/03/2017, 10:13 AMImportant Message  Patient Details  Name: LANASIA PORRAS MRN: [id number] Date of Birth: 1941/04/08   Medicare Important Message Given:  Yes    Kerin Salen 02/03/2017, 10:13 AM

## 2017-02-03 NOTE — Clinical Social Work Placement (Signed)
   CLINICAL SOCIAL WORK PLACEMENT  NOTE  Date:  02/03/2017  Patient Details  Name: Alicia Vasquez MRN: [id number] Date of Birth: 02/05/1941  Clinical Social Work is seeking post-discharge placement for this patient at the [place] level of care (*CSW will initial, date and re-position this form in  chart as items are completed):  Yes   Patient/family provided with [place] Work Department's list of facilities offering this level of care within the geographic area requested by the patient (or if unable, by the patient's family).  Yes   Patient/family informed of their freedom to choose among providers that offer the needed level of care, that participate in Medicare, Medicaid or managed care program needed by the patient, have an available bed and are willing to accept the patient.  Yes   Patient/family informed of [place]'s ownership interest in [facility] and [facility], as well as of the fact that they are under no obligation to receive care at these facilities.  PASRR submitted to EDS on       PASRR number received on       Existing PASRR number confirmed on 01/30/17     FL2 transmitted to all facilities in geographic area requested by pt/family on       FL2 transmitted to all facilities within larger geographic area on       Patient informed that his/her managed care company has contracts with or will negotiate with certain facilities, including the following:        Yes   Patient/family informed of bed offers received.  Patient chooses bed at [facility] [place] recommends and patient chooses bed at      Patient to be transferred to [facility] [place] on 02/03/17.  Patient to be transferred to facility by [facility]     Patient family notified on 02/03/17 of transfer.  Name of family member notified:  Friend     PHYSICIAN       Additional Comment: Pt / friend are in agreement with dc to [facility] today.  [facility] transport required. Medical necessity form completed. DC Summary sent to SNF for review. Scripts included in [employer]. # for report provided to nsg.    _______________________________________________ [name], LCSW 02/03/2017, 9:03 AM

## 2017-02-03 NOTE — Plan of Care (Signed)
Problem: Activity: Goal: Risk for activity intolerance will decrease Outcome: Progressing PT consult

## 2017-02-03 NOTE — Discharge Summary (Signed)
Physician Discharge Summary  Patient ID: Alicia Vasquez MRN: [id number] DOB/AGE: 77-Sep-1942 77 y.o.  Admit date: 01/26/2017 Discharge date:  02/03/2017  Procedures:  Procedure(s) (LRB): REVISION and OPEN REDUCTION INTERNAL FIXATION (ORIF) PERIPROSTHETIC FRACTURE LEFT HIP (Left)  Attending Physician:  Dr. Paralee Cancel   Admission Diagnoses:   Left peri-prosthetic femur fracture  Discharge Diagnoses:  Active Problems:   Peri-prosthetic fracture of shaft of femur  Past Medical History:  Diagnosis Date  . Allergy   . Anxiety   . Arthritis   . Blood transfusion without reported diagnosis   . 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 DVT (deep vein thrombosis) in pregnancy ([place])   . Hypertension   . Hypothyroid   . Personal history of radiation therapy   . Pneumonia   . Radiation 11/21/13-01/07/14   Right Breast/Supraclavicular    HPI:    Pt is a 76 y.o. female complaining of left hip pain since surgery on 01/16/2017, but worse just recently when her leg was moved rapidly while at a facility.  She has already had a THA due to a pathologic hip fracture. Patient orginally presented to the clinic where x-rays were taken and revealed a peri-prosthetic hip fracture.  She was directly admitted to the hospital.  Various options are discussed with the patient. Risks, benefits and expectations were discussed with the patient. Patient understand the risks, benefits and expectations and wishes to proceed with surgery.   PCP: Briscoe Deutscher, DO   Discharged Condition: fair  Hospital Course:  Patient was admitted to the hospital on 01/26/2017.  She had an uneventful course until having the above stated procedure on 01/31/2017.  Patient underwent the above stated procedure on 01/26/2017. Patient tolerated the procedure well and brought to the recovery room in good condition and subsequently to the floor.  POD #1 BP: 137/62 ; Pulse: 83 ; Temp: 98.4 F (36.9 C) ; Resp:  16 Patient reports pain as moderate, pain controlled. No events throughout the night other than having to deal with pain. Planing on SNF upon discharge, but not the one she came from.  Dorsiflexion/plantar flexion intact, incision: dressing C/D/I, no cellulitis present and compartment soft.   LABS  Basename    HGB     11.1  HCT     33.8   POD #2  BP: 120/45 ; Pulse: 93 ; Temp: 98.2 F (36.8 C) ; Resp: 20 Patient reports pain as moderate, discussed different medications but she can't tolerate any others. She is having a lot of difficulties with weight bearing, needing 4 people to help her up.  We have discussed her generalized weakness as well as healing form the surgery.   Dorsiflexion/plantar flexion intact, incision: dressing C/D/I, no cellulitis present and compartment soft.   LABS  Basename    HGB     9.4  HCT     28.6   POD #3  BP: 142/54 ; Pulse: 98 ; Temp: 98.2 F (36.8 C) ; Resp: 16 Patient reports pain as moderate, still having some pain.  States that it does help when she takes the Norco and the Robaxin.  She just recent took her Norco.  She mentions that she still has quite a bit of pain when she has to move.  Dorsiflexion/plantar flexion intact, incision: dressing C/D/I, no cellulitis present and compartment soft.   LABS   No new labs  Discharge Exam: General appearance: alert, cooperative and no distress Extremities: Homans sign is negative,  no sign of DVT, no edema, redness or tenderness in the calves or thighs and no ulcers, gangrene or trophic changes  Disposition:   [place] with follow up in 2 weeks    Contact information for follow-up providers    Paralee Cancel, MD. Schedule an appointment as soon as possible for a visit in 2 week(s).   Specialty:  Orthopedic Surgery Contact information: [address] [place] [address] [phone]            Contact information for after-discharge care    Destination     [facility] SNF/ALF Follow up.   Specialty:  [place] information: [address] [place] [phone]                  Discharge Instructions    Call MD / Call 911    Complete by:  As directed    If you experience chest pain or shortness of breath, CALL 911 and be transported to the hospital emergency room.  If you develope a fever above 101 F, pus (white drainage) or increased drainage or redness at the wound, or calf pain, call your surgeon's office.   Constipation Prevention    Complete by:  As directed    Drink plenty of fluids.  Prune juice may be helpful.  You may use a stool softener, such as Colace (over the counter) 100 mg twice a day.  Use MiraLax (over the counter) for constipation as needed.   Diet - low sodium heart healthy    Complete by:  As directed    Discharge instructions    Complete by:  As directed    Maintain surgical dressing for 3 days. Then replace with gauze and tape, but must keep the area dry and clean.  Follow up in 2 weeks at [facility]. Call with any questions or concerns.   Partial weight bearing    Complete by:  As directed    % Body Weight:  50   Laterality:  left   Extremity:  Lower      Allergies as of 02/03/2017      Reactions   Anesthetics, Amide Hypertension   Benadryl [diphenhydramine Hcl] Other (See Comments)   Dizziness   Carbocaine [mepivacaine Hcl] Hypertension   Codeine Other (See Comments)   Dizziness   Epinephrine Hypertension   Sulfa Antibiotics Other (See Comments)   dizziness   Latex Other (See Comments)   Blisters in mouth   Tramadol    Sedation.    Penicillins Rash   Has patient had a PCN reaction causing immediate rash, facial/tongue/throat swelling, SOB or lightheadedness with hypotension: Unknown Has patient had a PCN reaction causing severe rash involving mucus membranes or skin necrosis: Unknown Has patient had a PCN reaction  that required hospitalization: Unknown Has patient had a PCN reaction occurring within the last 10 years: Unknown If all of the above answers are "NO", then may proceed with Cephalosporin use.      Medication List    STOP taking these medications   HYDROcodone-acetaminophen 5-325 MG tablet Commonly known as:  NORCO Replaced by:  HYDROcodone-acetaminophen 7.5-325 MG tablet     TAKE these medications   anastrozole 1 MG tablet Commonly known as:  ARIMIDEX Take 1 tablet (1 mg total) by mouth daily.   docusate sodium 100 MG capsule Commonly known as:  COLACE Take 1 capsule (100 mg total) by mouth 2 (two) times daily.  enoxaparin 40 MG/0.4ML injection Commonly known as:  LOVENOX Inject 0.4 mLs (40 mg total) into the skin daily. Use along with Coumadin to obtain therapeutic INR. May stop Lovenox once INR >/= 1.8.   ferrous sulfate 325 (65 FE) MG tablet Take 1 tablet (325 mg total) by mouth 3 (three) times daily after meals.   HYDROcodone-acetaminophen 7.5-325 MG tablet Commonly known as:  NORCO Take 1-2 tablets by mouth every 4 (four) hours as needed for moderate pain. Replaces:  HYDROcodone-acetaminophen 5-325 MG tablet   levothyroxine 125 MCG tablet Commonly known as:  SYNTHROID Take 1 tablet (125 mcg total) by mouth daily before breakfast. Dispense as written: Synthroid LAST REFILL   methocarbamol 500 MG tablet Commonly known as:  ROBAXIN Take 1 tablet (500 mg total) by mouth every 6 (six) hours as needed for muscle spasms.   polyethylene glycol packet Commonly known as:  MIRALAX / GLYCOLAX Take 17 g by mouth 2 (two) times daily. What changed:  when to take this  reasons to take this   Vitamin D3 5000 units Tabs Take 5,000 Units by mouth daily.   warfarin 5 MG tablet Commonly known as:  JANTOVEN Take 1.5-2 tablets (7.5-10 mg total) by mouth See admin instructions. TAKE AS DIRECTED BY ANTICOAGULATION CLINIC. 7.5 MG MWF&SUN 10 MG TUE,THURS,SAT What changed:   additional instructions            Discharge Care Instructions        Start     Ordered   02/03/17 0000  enoxaparin (LOVENOX) 40 MG/0.4ML injection  Every 24 hours    Question:  Supervising Provider  Answer:  Alvan Dame, Gavin Faivre   02/03/17 0809   02/02/17 0000  docusate sodium (COLACE) 100 MG capsule  2 times daily    Question:  Supervising Provider  Answer:  Paralee Cancel   02/02/17 0731   02/02/17 0000  HYDROcodone-acetaminophen (NORCO) 7.5-325 MG tablet  Every 4 hours PRN    Question:  Supervising Provider  Answer:  Paralee Cancel   02/02/17 0731   02/02/17 0000  methocarbamol (ROBAXIN) 500 MG tablet  Every 6 hours PRN    Question:  Supervising Provider  Answer:  Paralee Cancel   02/02/17 0731   02/02/17 0000  polyethylene glycol (MIRALAX / Floria Raveling) packet  2 times daily    Question:  Supervising Provider  Answer:  Paralee Cancel   02/02/17 0731   02/02/17 0000  Call MD / Call 911    Comments:  If you experience chest pain or shortness of breath, CALL 911 and be transported to the hospital emergency room.  If you develope a fever above 101 F, pus (white drainage) or increased drainage or redness at the wound, or calf pain, call your surgeon's office.   02/02/17 0731   02/02/17 0000  Discharge instructions    Comments:  Maintain surgical dressing for 3 days. Then replace with gauze and tape, but must keep the area dry and clean.  Follow up in 2 weeks at [facility]. Call with any questions or concerns.   02/02/17 0731   02/02/17 0000  Diet - low sodium heart healthy     02/02/17 0731   02/02/17 0000  Constipation Prevention    Comments:  Drink plenty of fluids.  Prune juice may be helpful.  You may use a stool softener, such as Colace (over the counter) 100 mg twice a day.  Use MiraLax (over the counter) for constipation as needed.   02/02/17 1700  02/02/17 0000  Partial weight bearing    Question Answer Comment  % Body Weight 50   Laterality left   Extremity Lower        02/02/17 0731       Signed: [place]. Jaise Moser   PA-C  02/03/2017, 8:10 AM

## 2017-02-03 NOTE — Telephone Encounter (Signed)
Spoke to [place] at [place] and told her pt had one pneumonia shot in 2011 but I am not sure which one it say unspecified in the chart and she has not had a Flu shot. Alicia Vasquez verbalized understanding.

## 2017-02-06 ENCOUNTER — Other Ambulatory Visit: Payer: Self-pay | Admitting: Oncology

## 2017-02-06 MED ORDER — PALBOCICLIB 125 MG PO CAPS: 125.0000 mg | ORAL_CAPSULE | Freq: Every day | ORAL | 6 refills | Status: DC

## 2017-02-07 ENCOUNTER — Telehealth: Payer: Self-pay | Admitting: Pharmacist

## 2017-02-07 DIAGNOSIS — Z170 Estrogen receptor positive status [ER+]: Principal | ICD-10-CM

## 2017-02-07 DIAGNOSIS — C50411 Malignant neoplasm of upper-outer quadrant of right female breast: Secondary | ICD-10-CM

## 2017-02-07 NOTE — Telephone Encounter (Signed)
Oral Oncology Pharmacist Encounter  Received new prescription for Ibrance for the treatment of metastatic, hormone receptor positive breast cancer in conjunction with Faslodex injections, planned duration until disease progression or unacceptable toxicity.  Recent labs in Epic assessed, OK for treatment.  Current medication list in Epic reviewed, no significant DDIs with Ibrance identified. 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  Prescription has been e-scribed to the [facility] for benefits analysis and approval. No prescription insurance on file. Oral Oncology Clinic will reach out to patient for insurance information.  Oral Oncology Clinic will continue to follow for insurance authorization, copayment issues, initial counseling and start date.  Johny Drilling, PharmD, BCPS, BCOP 02/07/2017 10:06 AM Oral Oncology Clinic [phone]

## 2017-02-07 NOTE — Telephone Encounter (Signed)
Left pt message asking to call Allison back directly at [phone] to schedule AWV. Thanks! ° °*NOTE* Never had AWV before °

## 2017-02-08 ENCOUNTER — Other Ambulatory Visit: Payer: Self-pay | Admitting: Oncology

## 2017-02-08 ENCOUNTER — Telehealth: Payer: Self-pay

## 2017-02-08 DIAGNOSIS — Z96642 Presence of left artificial hip joint: Secondary | ICD-10-CM | POA: Diagnosis not present

## 2017-02-08 DIAGNOSIS — Z96649 Presence of unspecified artificial hip joint: Secondary | ICD-10-CM | POA: Diagnosis not present

## 2017-02-08 DIAGNOSIS — Z4789 Encounter for other orthopedic aftercare: Secondary | ICD-10-CM | POA: Diagnosis not present

## 2017-02-08 DIAGNOSIS — M978XXD Periprosthetic fracture around other internal prosthetic joint, subsequent encounter: Secondary | ICD-10-CM | POA: Diagnosis not present

## 2017-02-08 MED ORDER — PALBOCICLIB 125 MG PO CAPS: 125.0000 mg | ORAL_CAPSULE | Freq: Every day | ORAL | 6 refills | Status: DC

## 2017-02-08 NOTE — Telephone Encounter (Signed)
Order given to Glendora Score, nurse at [facility], for Cipro 250mg  tablet two times a day for 5 days

## 2017-02-08 NOTE — Telephone Encounter (Signed)
Oral Chemotherapy Pharmacist Encounter  Spoke with MD about [facility] start date and possible need for manufacturer assistance for patient.  Per Dr. Jana Hakim, [place] to fill 1st cycle of Ibrance with manufacturer free voucher. Patient to be instructed to wait until seen in the office on 9/27 by Thedore Mins, NP prior to starting [facility].  I LVM for patient with offer for medication update and initial counseling.  Oral Oncology Patient Advocate has also LVMs for patient to discuss manufacturer assistance application.  Prescription re-sent to the pharmacy with clarified directions for 21 days on, 7 days off dosing.  Oral Oncology Clinic will continue to follow.  Thank you,  Johny Drilling, PharmD, BCPS, BCOP 02/08/2017  2:23 PM Oral Oncology Clinic [phone]

## 2017-02-08 NOTE — Progress Notes (Unsigned)
Alicia Vasquez's urine cultures showing Pseudomonas which is sensitive to cephalosporins and Cipro. We are going to go ahead and treat this. They will let us know if she has any unusual side effects from this. She will need to have a culture repeated next week.

## 2017-02-09 ENCOUNTER — Ambulatory Visit: Payer: Medicare Other

## 2017-02-09 ENCOUNTER — Other Ambulatory Visit: Payer: Self-pay | Admitting: Pharmacist

## 2017-02-09 ENCOUNTER — Ambulatory Visit: Payer: Medicare Other | Admitting: Adult Health

## 2017-02-09 ENCOUNTER — Other Ambulatory Visit: Payer: Medicare Other

## 2017-02-09 NOTE — Telephone Encounter (Signed)
Oral Chemotherapy Pharmacist Encounter   I spoke with patient on 02/08/17 for overview of: Alicia Vasquez   She is currently at [facility] facility following surgery performed 02/01/17 (direct dial to patient's room [phone]). Patient states her cell phone does not work well at facility. Patient also noted to have untreated P. aeruginosa UTI from 9/4 urine culture. Antibiotic called to facility by collaborative practice RN.  Patient will need repeat urine culture when back in the office for follow-up on 02/13/17.  The first fill of Alicia Vasquez will come from the [employer] on manufacturer free voucher. No prescription insurance on file. Oral Oncology Clinic will reach out to patient for insurance information.   Patient will take Ibrance 125mg  capsules, 1 capsule by mouth once daily with breakfast, for 3 weeks on, 1 week off. Patient knows to avoid grapefruit and grapefruit juice.  Patient understands she will discontinue her anastrazole after starting Faslodex injections.  Side effects include but not limited to: fatigue, hair loss, GI upset, nausea, decreased blood counts, and increased upper respiratory infections.   Reviewed with patient importance of keeping a medication schedule and plan for any missed doses.  Alicia Vasquez voiced understanding and appreciation.   All questions answered. Ibrance start date: 02/23/17, after seen in the office, if patient status allows post surgery.  Patient knows to call the office with questions or concerns. Oral Oncology Clinic will continue to follow.  Thank you,  Johny Drilling, PharmD, BCPS, BCOP 02/09/2017  9:31 AM Oral Oncology Clinic [phone]

## 2017-02-15 ENCOUNTER — Ambulatory Visit: Payer: Medicare Other

## 2017-02-15 ENCOUNTER — Ambulatory Visit: Payer: Medicare Other | Admitting: Radiation Oncology

## 2017-02-15 DIAGNOSIS — Z4789 Encounter for other orthopedic aftercare: Secondary | ICD-10-CM | POA: Diagnosis not present

## 2017-02-15 DIAGNOSIS — Z96642 Presence of left artificial hip joint: Secondary | ICD-10-CM | POA: Diagnosis not present

## 2017-02-15 DIAGNOSIS — M978XXD Periprosthetic fracture around other internal prosthetic joint, subsequent encounter: Secondary | ICD-10-CM | POA: Diagnosis not present

## 2017-02-22 DIAGNOSIS — Z96642 Presence of left artificial hip joint: Secondary | ICD-10-CM | POA: Diagnosis not present

## 2017-02-22 DIAGNOSIS — M978XXD Periprosthetic fracture around other internal prosthetic joint, subsequent encounter: Secondary | ICD-10-CM | POA: Diagnosis not present

## 2017-02-22 DIAGNOSIS — Z4789 Encounter for other orthopedic aftercare: Secondary | ICD-10-CM | POA: Diagnosis not present

## 2017-02-23 ENCOUNTER — Ambulatory Visit: Payer: Medicare Other

## 2017-02-23 ENCOUNTER — Ambulatory Visit: Payer: Medicare Other | Admitting: Adult Health

## 2017-02-23 ENCOUNTER — Other Ambulatory Visit: Payer: Medicare Other

## 2017-03-01 ENCOUNTER — Telehealth: Payer: Self-pay | Admitting: Family Medicine

## 2017-03-01 NOTE — Telephone Encounter (Signed)
Noted  

## 2017-03-01 NOTE — Telephone Encounter (Signed)
Patient rescheduled 03/07/17 appointment to 04/14/17. She has recently had hip surgery and would like to wait to come back in. She states to let her know if she needs to come in earlier.

## 2017-03-02 ENCOUNTER — Telehealth: Payer: Self-pay

## 2017-03-02 DIAGNOSIS — F418 Other specified anxiety disorders: Secondary | ICD-10-CM | POA: Diagnosis not present

## 2017-03-02 DIAGNOSIS — I10 Essential (primary) hypertension: Secondary | ICD-10-CM | POA: Diagnosis not present

## 2017-03-02 DIAGNOSIS — M1711 Unilateral primary osteoarthritis, right knee: Secondary | ICD-10-CM | POA: Diagnosis not present

## 2017-03-02 DIAGNOSIS — Z7901 Long term (current) use of anticoagulants: Secondary | ICD-10-CM | POA: Diagnosis not present

## 2017-03-02 DIAGNOSIS — M9702XD Periprosthetic fracture around internal prosthetic left hip joint, subsequent encounter: Secondary | ICD-10-CM | POA: Diagnosis not present

## 2017-03-02 DIAGNOSIS — T84011D Broken internal left hip prosthesis, subsequent encounter: Secondary | ICD-10-CM | POA: Diagnosis not present

## 2017-03-02 NOTE — Telephone Encounter (Signed)
Alicia Vasquez with [facility] called. The pt has been discharged from [facility] yesterday.  Now she is at home and asking about the [place].  INR today is 3.0.

## 2017-03-02 NOTE — Telephone Encounter (Signed)
This RN returned call to Elmyra Ricks - discussed pt's current status s/p hip replacement followed by femur frx with fixation.  Today was Nicole's first visit - pt was able to bear weight for 2 minutes at this time and will be undergoing further PT.  Surgical wound is healing well with steri strips still in place.  Per call Elmyra Ricks could not give pt's current medication list including dose of coumadin but does recall that pt is presently on an ABX.  Per call and above discussion of pt assessment and status -   No change in coumadin.  [facility] will not be started until pt is seen in the office 03/23/2017 due to need for further wound healing.  INR will be checked again in 1 week due to current ABX use and pt is non weight bearing.  Direct desk number given to [place] for contact during business hours.  No other needs at this time.  This message will be sent to covering provider for review of communication

## 2017-03-03 DIAGNOSIS — T84011D Broken internal left hip prosthesis, subsequent encounter: Secondary | ICD-10-CM | POA: Diagnosis not present

## 2017-03-03 DIAGNOSIS — M9702XD Periprosthetic fracture around internal prosthetic left hip joint, subsequent encounter: Secondary | ICD-10-CM | POA: Diagnosis not present

## 2017-03-03 DIAGNOSIS — Z7901 Long term (current) use of anticoagulants: Secondary | ICD-10-CM | POA: Diagnosis not present

## 2017-03-03 DIAGNOSIS — I10 Essential (primary) hypertension: Secondary | ICD-10-CM | POA: Diagnosis not present

## 2017-03-03 DIAGNOSIS — F418 Other specified anxiety disorders: Secondary | ICD-10-CM | POA: Diagnosis not present

## 2017-03-03 DIAGNOSIS — M1711 Unilateral primary osteoarthritis, right knee: Secondary | ICD-10-CM | POA: Diagnosis not present

## 2017-03-06 DIAGNOSIS — M1711 Unilateral primary osteoarthritis, right knee: Secondary | ICD-10-CM | POA: Diagnosis not present

## 2017-03-06 DIAGNOSIS — Z7901 Long term (current) use of anticoagulants: Secondary | ICD-10-CM | POA: Diagnosis not present

## 2017-03-06 DIAGNOSIS — F418 Other specified anxiety disorders: Secondary | ICD-10-CM | POA: Diagnosis not present

## 2017-03-06 DIAGNOSIS — I10 Essential (primary) hypertension: Secondary | ICD-10-CM | POA: Diagnosis not present

## 2017-03-06 DIAGNOSIS — T84011D Broken internal left hip prosthesis, subsequent encounter: Secondary | ICD-10-CM | POA: Diagnosis not present

## 2017-03-06 DIAGNOSIS — M9702XD Periprosthetic fracture around internal prosthetic left hip joint, subsequent encounter: Secondary | ICD-10-CM | POA: Diagnosis not present

## 2017-03-07 ENCOUNTER — Ambulatory Visit: Payer: Medicare Other | Admitting: Family Medicine

## 2017-03-07 DIAGNOSIS — I10 Essential (primary) hypertension: Secondary | ICD-10-CM | POA: Diagnosis not present

## 2017-03-07 DIAGNOSIS — T84011D Broken internal left hip prosthesis, subsequent encounter: Secondary | ICD-10-CM | POA: Diagnosis not present

## 2017-03-07 DIAGNOSIS — F418 Other specified anxiety disorders: Secondary | ICD-10-CM | POA: Diagnosis not present

## 2017-03-07 DIAGNOSIS — M9702XD Periprosthetic fracture around internal prosthetic left hip joint, subsequent encounter: Secondary | ICD-10-CM | POA: Diagnosis not present

## 2017-03-07 DIAGNOSIS — M1711 Unilateral primary osteoarthritis, right knee: Secondary | ICD-10-CM | POA: Diagnosis not present

## 2017-03-07 DIAGNOSIS — Z7901 Long term (current) use of anticoagulants: Secondary | ICD-10-CM | POA: Diagnosis not present

## 2017-03-08 DIAGNOSIS — F418 Other specified anxiety disorders: Secondary | ICD-10-CM | POA: Diagnosis not present

## 2017-03-08 DIAGNOSIS — I10 Essential (primary) hypertension: Secondary | ICD-10-CM | POA: Diagnosis not present

## 2017-03-08 DIAGNOSIS — M1711 Unilateral primary osteoarthritis, right knee: Secondary | ICD-10-CM | POA: Diagnosis not present

## 2017-03-08 DIAGNOSIS — M9702XD Periprosthetic fracture around internal prosthetic left hip joint, subsequent encounter: Secondary | ICD-10-CM | POA: Diagnosis not present

## 2017-03-08 DIAGNOSIS — T84011D Broken internal left hip prosthesis, subsequent encounter: Secondary | ICD-10-CM | POA: Diagnosis not present

## 2017-03-08 DIAGNOSIS — Z7901 Long term (current) use of anticoagulants: Secondary | ICD-10-CM | POA: Diagnosis not present

## 2017-03-09 ENCOUNTER — Telehealth: Payer: Self-pay

## 2017-03-09 DIAGNOSIS — M9702XD Periprosthetic fracture around internal prosthetic left hip joint, subsequent encounter: Secondary | ICD-10-CM | POA: Diagnosis not present

## 2017-03-09 DIAGNOSIS — T84011D Broken internal left hip prosthesis, subsequent encounter: Secondary | ICD-10-CM | POA: Diagnosis not present

## 2017-03-09 DIAGNOSIS — Z7901 Long term (current) use of anticoagulants: Secondary | ICD-10-CM | POA: Diagnosis not present

## 2017-03-09 DIAGNOSIS — F418 Other specified anxiety disorders: Secondary | ICD-10-CM | POA: Diagnosis not present

## 2017-03-09 DIAGNOSIS — M1711 Unilateral primary osteoarthritis, right knee: Secondary | ICD-10-CM | POA: Diagnosis not present

## 2017-03-09 DIAGNOSIS — I10 Essential (primary) hypertension: Secondary | ICD-10-CM | POA: Diagnosis not present

## 2017-03-09 NOTE — Telephone Encounter (Signed)
"  This is Medical sales representative at [place].  I spoke with Cecille Rubin about this patient's INR.  The number provided for dr. Elon Jester was the pager.  Please return call [phone]."

## 2017-03-09 NOTE — Telephone Encounter (Signed)
Spoke with Tiffany from [facility] concerning patient's INR which is 1.8.  Patient on 7.5mg .  Tiffany requesting refill for patient from [facility] but Rx was given from Dr. Elon Jester.   Called Tiffany back with MD number and suggested she call his office for refill for patient.

## 2017-03-10 ENCOUNTER — Other Ambulatory Visit: Payer: Self-pay

## 2017-03-10 ENCOUNTER — Telehealth: Payer: Self-pay

## 2017-03-10 DIAGNOSIS — Z86711 Personal history of pulmonary embolism: Secondary | ICD-10-CM

## 2017-03-10 MED ORDER — WARFARIN SODIUM 5 MG PO TABS: 7.5000 mg | ORAL_TABLET | Freq: Every day | ORAL | Status: DC

## 2017-03-10 NOTE — Telephone Encounter (Signed)
Returned call to [place] at [facility] and gave her different number for Dr. Elon Jester, [phone].

## 2017-03-10 NOTE — Telephone Encounter (Signed)
10/11 INR is 1.8.  Pt takes 7.5mg  warfarin nightly.  Per GM, continue current dose.  INR level will be rechecked by [facility] services on 10/18.  Pt verbalizes understanding of continued dose.  [facility] [address] per pt's request.

## 2017-03-13 DIAGNOSIS — M9702XD Periprosthetic fracture around internal prosthetic left hip joint, subsequent encounter: Secondary | ICD-10-CM | POA: Diagnosis not present

## 2017-03-13 DIAGNOSIS — M1711 Unilateral primary osteoarthritis, right knee: Secondary | ICD-10-CM | POA: Diagnosis not present

## 2017-03-13 DIAGNOSIS — T84011D Broken internal left hip prosthesis, subsequent encounter: Secondary | ICD-10-CM | POA: Diagnosis not present

## 2017-03-13 DIAGNOSIS — F418 Other specified anxiety disorders: Secondary | ICD-10-CM | POA: Diagnosis not present

## 2017-03-13 DIAGNOSIS — Z7901 Long term (current) use of anticoagulants: Secondary | ICD-10-CM | POA: Diagnosis not present

## 2017-03-13 DIAGNOSIS — I10 Essential (primary) hypertension: Secondary | ICD-10-CM | POA: Diagnosis not present

## 2017-03-14 DIAGNOSIS — I10 Essential (primary) hypertension: Secondary | ICD-10-CM | POA: Diagnosis not present

## 2017-03-14 DIAGNOSIS — F418 Other specified anxiety disorders: Secondary | ICD-10-CM | POA: Diagnosis not present

## 2017-03-14 DIAGNOSIS — M9702XD Periprosthetic fracture around internal prosthetic left hip joint, subsequent encounter: Secondary | ICD-10-CM | POA: Diagnosis not present

## 2017-03-14 DIAGNOSIS — Z7901 Long term (current) use of anticoagulants: Secondary | ICD-10-CM | POA: Diagnosis not present

## 2017-03-14 DIAGNOSIS — T84011D Broken internal left hip prosthesis, subsequent encounter: Secondary | ICD-10-CM | POA: Diagnosis not present

## 2017-03-14 DIAGNOSIS — M1711 Unilateral primary osteoarthritis, right knee: Secondary | ICD-10-CM | POA: Diagnosis not present

## 2017-03-15 DIAGNOSIS — Z4789 Encounter for other orthopedic aftercare: Secondary | ICD-10-CM | POA: Diagnosis not present

## 2017-03-15 DIAGNOSIS — Z96649 Presence of unspecified artificial hip joint: Secondary | ICD-10-CM | POA: Diagnosis not present

## 2017-03-15 DIAGNOSIS — M978XXD Periprosthetic fracture around other internal prosthetic joint, subsequent encounter: Secondary | ICD-10-CM | POA: Diagnosis not present

## 2017-03-16 ENCOUNTER — Telehealth: Payer: Self-pay

## 2017-03-16 ENCOUNTER — Other Ambulatory Visit: Payer: Self-pay

## 2017-03-16 DIAGNOSIS — Z7901 Long term (current) use of anticoagulants: Secondary | ICD-10-CM | POA: Diagnosis not present

## 2017-03-16 DIAGNOSIS — Z86711 Personal history of pulmonary embolism: Secondary | ICD-10-CM

## 2017-03-16 DIAGNOSIS — M9702XD Periprosthetic fracture around internal prosthetic left hip joint, subsequent encounter: Secondary | ICD-10-CM | POA: Diagnosis not present

## 2017-03-16 DIAGNOSIS — M1711 Unilateral primary osteoarthritis, right knee: Secondary | ICD-10-CM | POA: Diagnosis not present

## 2017-03-16 DIAGNOSIS — I10 Essential (primary) hypertension: Secondary | ICD-10-CM | POA: Diagnosis not present

## 2017-03-16 DIAGNOSIS — T84011D Broken internal left hip prosthesis, subsequent encounter: Secondary | ICD-10-CM | POA: Diagnosis not present

## 2017-03-16 DIAGNOSIS — F418 Other specified anxiety disorders: Secondary | ICD-10-CM | POA: Diagnosis not present

## 2017-03-16 MED ORDER — WARFARIN SODIUM 5 MG PO TABS: 5.0000 mg | ORAL_TABLET | Freq: Every day | ORAL | 1 refills | Status: DC

## 2017-03-16 NOTE — Telephone Encounter (Signed)
INR 4.0  Per Dr Jana Hakim, hold warfarin tonight.  Resume on Friday alternating 5mg s and 7.5mg s.  Pt states she has only 7.5mg  tablets.  Prescription for 5mg  tablets [facility] with instructions for 7.5mg  Mon, Wed, Fri, and Sun.  5mg  on Tues, Thurs, and Sat. Pt verbalizes understanding of instructions.

## 2017-03-17 ENCOUNTER — Other Ambulatory Visit: Payer: Self-pay | Admitting: Oncology

## 2017-03-17 DIAGNOSIS — Z86711 Personal history of pulmonary embolism: Secondary | ICD-10-CM

## 2017-03-17 MED ORDER — WARFARIN SODIUM 5 MG PO TABS: 5.0000 mg | ORAL_TABLET | Freq: Every day | ORAL | 1 refills | Status: DC

## 2017-03-17 NOTE — Telephone Encounter (Signed)
No entry 

## 2017-03-20 ENCOUNTER — Other Ambulatory Visit: Payer: Self-pay

## 2017-03-20 ENCOUNTER — Telehealth: Payer: Self-pay

## 2017-03-20 DIAGNOSIS — F418 Other specified anxiety disorders: Secondary | ICD-10-CM | POA: Diagnosis not present

## 2017-03-20 DIAGNOSIS — Z7901 Long term (current) use of anticoagulants: Secondary | ICD-10-CM | POA: Diagnosis not present

## 2017-03-20 DIAGNOSIS — M1711 Unilateral primary osteoarthritis, right knee: Secondary | ICD-10-CM | POA: Diagnosis not present

## 2017-03-20 DIAGNOSIS — Z86711 Personal history of pulmonary embolism: Secondary | ICD-10-CM

## 2017-03-20 DIAGNOSIS — T84011D Broken internal left hip prosthesis, subsequent encounter: Secondary | ICD-10-CM | POA: Diagnosis not present

## 2017-03-20 DIAGNOSIS — I10 Essential (primary) hypertension: Secondary | ICD-10-CM | POA: Diagnosis not present

## 2017-03-20 DIAGNOSIS — M9702XD Periprosthetic fracture around internal prosthetic left hip joint, subsequent encounter: Secondary | ICD-10-CM | POA: Diagnosis not present

## 2017-03-20 MED ORDER — WARFARIN SODIUM 5 MG PO TABS: 5.0000 mg | ORAL_TABLET | Freq: Every day | ORAL | 1 refills | Status: DC

## 2017-03-20 NOTE — Telephone Encounter (Signed)
LVM with provided number for Tiffany, home care nurse.  Gave verbal order for weekly INR check along with instructions for warfarin dose.

## 2017-03-21 DIAGNOSIS — I10 Essential (primary) hypertension: Secondary | ICD-10-CM | POA: Diagnosis not present

## 2017-03-21 DIAGNOSIS — M1711 Unilateral primary osteoarthritis, right knee: Secondary | ICD-10-CM | POA: Diagnosis not present

## 2017-03-21 DIAGNOSIS — Z7901 Long term (current) use of anticoagulants: Secondary | ICD-10-CM | POA: Diagnosis not present

## 2017-03-21 DIAGNOSIS — M9702XD Periprosthetic fracture around internal prosthetic left hip joint, subsequent encounter: Secondary | ICD-10-CM | POA: Diagnosis not present

## 2017-03-21 DIAGNOSIS — T84011D Broken internal left hip prosthesis, subsequent encounter: Secondary | ICD-10-CM | POA: Diagnosis not present

## 2017-03-21 DIAGNOSIS — F418 Other specified anxiety disorders: Secondary | ICD-10-CM | POA: Diagnosis not present

## 2017-03-22 ENCOUNTER — Other Ambulatory Visit: Payer: Self-pay | Admitting: *Deleted

## 2017-03-22 DIAGNOSIS — I10 Essential (primary) hypertension: Secondary | ICD-10-CM | POA: Diagnosis not present

## 2017-03-22 DIAGNOSIS — T84011D Broken internal left hip prosthesis, subsequent encounter: Secondary | ICD-10-CM | POA: Diagnosis not present

## 2017-03-22 DIAGNOSIS — M9702XD Periprosthetic fracture around internal prosthetic left hip joint, subsequent encounter: Secondary | ICD-10-CM | POA: Diagnosis not present

## 2017-03-22 DIAGNOSIS — F418 Other specified anxiety disorders: Secondary | ICD-10-CM | POA: Diagnosis not present

## 2017-03-22 DIAGNOSIS — M1711 Unilateral primary osteoarthritis, right knee: Secondary | ICD-10-CM | POA: Diagnosis not present

## 2017-03-22 DIAGNOSIS — Z7901 Long term (current) use of anticoagulants: Secondary | ICD-10-CM | POA: Diagnosis not present

## 2017-03-23 ENCOUNTER — Ambulatory Visit: Payer: Medicare Other

## 2017-03-23 ENCOUNTER — Ambulatory Visit: Payer: Medicare Other | Admitting: Adult Health

## 2017-03-23 ENCOUNTER — Other Ambulatory Visit: Payer: Medicare Other

## 2017-03-23 DIAGNOSIS — T84011D Broken internal left hip prosthesis, subsequent encounter: Secondary | ICD-10-CM | POA: Diagnosis not present

## 2017-03-23 DIAGNOSIS — M9702XD Periprosthetic fracture around internal prosthetic left hip joint, subsequent encounter: Secondary | ICD-10-CM | POA: Diagnosis not present

## 2017-03-23 DIAGNOSIS — I10 Essential (primary) hypertension: Secondary | ICD-10-CM | POA: Diagnosis not present

## 2017-03-23 DIAGNOSIS — Z7901 Long term (current) use of anticoagulants: Secondary | ICD-10-CM | POA: Diagnosis not present

## 2017-03-23 DIAGNOSIS — M1711 Unilateral primary osteoarthritis, right knee: Secondary | ICD-10-CM | POA: Diagnosis not present

## 2017-03-23 DIAGNOSIS — F418 Other specified anxiety disorders: Secondary | ICD-10-CM | POA: Diagnosis not present

## 2017-03-24 ENCOUNTER — Telehealth: Payer: Self-pay | Admitting: *Deleted

## 2017-03-24 NOTE — Telephone Encounter (Signed)
This RN contacted [place] with [facility] per on call MD note.  Called INR of 3.3 10/25 after clinic hours. On call MD recommended for pt to hold coumadin at present - and follow up per Dr Gerarda Fraction recommendations.  Per Tiffany - pt is holding coumadin at present. She cannot send a " hard copy " of results due to testing was done by an ambulatory device- blood was not sent out to a lab.  This RN will give above information to Dr Jannifer Rodney for further recommendations.

## 2017-03-24 NOTE — Telephone Encounter (Signed)
This RN was unable to reach pt per number on demographic page- stating "VM but full and could not leave a message".  This RN contacted [facility] - home health nurse with instructions for pt to decrease coumadin to 5 mg daily and with repeat lab in 1 week.  Tiffany stated above and she will contact pt.

## 2017-03-27 DIAGNOSIS — F418 Other specified anxiety disorders: Secondary | ICD-10-CM | POA: Diagnosis not present

## 2017-03-27 DIAGNOSIS — T84011D Broken internal left hip prosthesis, subsequent encounter: Secondary | ICD-10-CM | POA: Diagnosis not present

## 2017-03-27 DIAGNOSIS — Z7901 Long term (current) use of anticoagulants: Secondary | ICD-10-CM | POA: Diagnosis not present

## 2017-03-27 DIAGNOSIS — M1711 Unilateral primary osteoarthritis, right knee: Secondary | ICD-10-CM | POA: Diagnosis not present

## 2017-03-27 DIAGNOSIS — M9702XD Periprosthetic fracture around internal prosthetic left hip joint, subsequent encounter: Secondary | ICD-10-CM | POA: Diagnosis not present

## 2017-03-27 DIAGNOSIS — I10 Essential (primary) hypertension: Secondary | ICD-10-CM | POA: Diagnosis not present

## 2017-03-30 DIAGNOSIS — T84011D Broken internal left hip prosthesis, subsequent encounter: Secondary | ICD-10-CM | POA: Diagnosis not present

## 2017-03-30 DIAGNOSIS — F418 Other specified anxiety disorders: Secondary | ICD-10-CM | POA: Diagnosis not present

## 2017-03-30 DIAGNOSIS — I10 Essential (primary) hypertension: Secondary | ICD-10-CM | POA: Diagnosis not present

## 2017-03-30 DIAGNOSIS — M9702XD Periprosthetic fracture around internal prosthetic left hip joint, subsequent encounter: Secondary | ICD-10-CM | POA: Diagnosis not present

## 2017-03-30 DIAGNOSIS — M1711 Unilateral primary osteoarthritis, right knee: Secondary | ICD-10-CM | POA: Diagnosis not present

## 2017-03-30 DIAGNOSIS — Z7901 Long term (current) use of anticoagulants: Secondary | ICD-10-CM | POA: Diagnosis not present

## 2017-03-31 ENCOUNTER — Telehealth: Payer: Self-pay | Admitting: Adult Health

## 2017-03-31 ENCOUNTER — Ambulatory Visit: Payer: Medicare Other

## 2017-03-31 ENCOUNTER — Telehealth: Payer: Self-pay | Admitting: *Deleted

## 2017-03-31 ENCOUNTER — Ambulatory Visit: Payer: Medicare Other | Admitting: Adult Health

## 2017-03-31 ENCOUNTER — Other Ambulatory Visit: Payer: Medicare Other

## 2017-03-31 DIAGNOSIS — I10 Essential (primary) hypertension: Secondary | ICD-10-CM | POA: Diagnosis not present

## 2017-03-31 DIAGNOSIS — M1711 Unilateral primary osteoarthritis, right knee: Secondary | ICD-10-CM | POA: Diagnosis not present

## 2017-03-31 DIAGNOSIS — F418 Other specified anxiety disorders: Secondary | ICD-10-CM | POA: Diagnosis not present

## 2017-03-31 DIAGNOSIS — T84011D Broken internal left hip prosthesis, subsequent encounter: Secondary | ICD-10-CM | POA: Diagnosis not present

## 2017-03-31 DIAGNOSIS — M9702XD Periprosthetic fracture around internal prosthetic left hip joint, subsequent encounter: Secondary | ICD-10-CM | POA: Diagnosis not present

## 2017-03-31 DIAGNOSIS — Z7901 Long term (current) use of anticoagulants: Secondary | ICD-10-CM | POA: Diagnosis not present

## 2017-03-31 NOTE — Telephone Encounter (Signed)
VM received from pt stating she has been trying to contact scheduling per need to reschedule her appointment for 230 today.  Urgent LOS sent per above request.

## 2017-03-31 NOTE — Telephone Encounter (Signed)
Left message for patient regarding appt per 11/2 sch msg.

## 2017-04-03 ENCOUNTER — Ambulatory Visit: Payer: Medicare Other | Admitting: Adult Health

## 2017-04-03 ENCOUNTER — Ambulatory Visit: Payer: Medicare Other

## 2017-04-03 ENCOUNTER — Other Ambulatory Visit: Payer: Medicare Other

## 2017-04-05 DIAGNOSIS — T84011D Broken internal left hip prosthesis, subsequent encounter: Secondary | ICD-10-CM | POA: Diagnosis not present

## 2017-04-05 DIAGNOSIS — I10 Essential (primary) hypertension: Secondary | ICD-10-CM | POA: Diagnosis not present

## 2017-04-05 DIAGNOSIS — M9702XD Periprosthetic fracture around internal prosthetic left hip joint, subsequent encounter: Secondary | ICD-10-CM | POA: Diagnosis not present

## 2017-04-05 DIAGNOSIS — M1711 Unilateral primary osteoarthritis, right knee: Secondary | ICD-10-CM | POA: Diagnosis not present

## 2017-04-05 DIAGNOSIS — F418 Other specified anxiety disorders: Secondary | ICD-10-CM | POA: Diagnosis not present

## 2017-04-05 DIAGNOSIS — Z7901 Long term (current) use of anticoagulants: Secondary | ICD-10-CM | POA: Diagnosis not present

## 2017-04-06 DIAGNOSIS — M9702XD Periprosthetic fracture around internal prosthetic left hip joint, subsequent encounter: Secondary | ICD-10-CM | POA: Diagnosis not present

## 2017-04-06 DIAGNOSIS — F418 Other specified anxiety disorders: Secondary | ICD-10-CM | POA: Diagnosis not present

## 2017-04-06 DIAGNOSIS — Z5181 Encounter for therapeutic drug level monitoring: Secondary | ICD-10-CM | POA: Diagnosis not present

## 2017-04-06 DIAGNOSIS — Z7901 Long term (current) use of anticoagulants: Secondary | ICD-10-CM | POA: Diagnosis not present

## 2017-04-06 DIAGNOSIS — I10 Essential (primary) hypertension: Secondary | ICD-10-CM | POA: Diagnosis not present

## 2017-04-06 DIAGNOSIS — T84011D Broken internal left hip prosthesis, subsequent encounter: Secondary | ICD-10-CM | POA: Diagnosis not present

## 2017-04-06 DIAGNOSIS — M1711 Unilateral primary osteoarthritis, right knee: Secondary | ICD-10-CM | POA: Diagnosis not present

## 2017-04-06 LAB — PROTIME-INR: INR: 1.3 — AB (ref 0.9–1.1)

## 2017-04-07 ENCOUNTER — Telehealth: Payer: Self-pay | Admitting: *Deleted

## 2017-04-07 NOTE — Telephone Encounter (Signed)
This RN spoke with Control and instrumentation engineer with [facility] who states INR reading of 1.33 per draw yesterday ( lab had to be sent out ) - Jackelyn Poling is unsure of coumadin dose - pt thinks she is taking 5 mg nightly - but could not verify. Per her friend she lives with she showed only bottle of medication pt is taking as Miralax.  This RN informed Jackelyn Poling no change in coumadin per unknown current dosing- pt needs to take 5 mg nightly.  Per further discussion relating to concerns with pt's status both physical and social concerns including pt will complete home health care next week- she has platued with PT there fore nursing will not be able to be in the home.  Note home care was initiated s/p discharge for a fractured hip under Dr Adriana Mccallum. This office is not attending and was able to request home INR checks based off above home care.   This RN discussed with Jackelyn Poling - pt has canceled appointments here multiple times - therefore she has not completed staging of her known recurrent breast cancer nor is under any treatment plan.  Debbie inquired upon completion of home health would a Palliative home care referral be appropriate to assist with patient.  Plan per call is Jackelyn Poling will follow up with pt next week and obtain INR.  She has placed a referral to their social work dept for possible resources including obtaining a ramp to assist with pt being able to ambulate out of the home.  This RN will have MD review the above for further home care referrals- as well as this note will be sent to our SW dept for any available resources.

## 2017-04-11 ENCOUNTER — Telehealth: Payer: Self-pay | Admitting: *Deleted

## 2017-04-11 ENCOUNTER — Ambulatory Visit: Payer: Medicare Other | Admitting: Family Medicine

## 2017-04-11 ENCOUNTER — Other Ambulatory Visit: Payer: Self-pay | Admitting: *Deleted

## 2017-04-11 DIAGNOSIS — M84659P Pathological fracture in other disease, hip, unspecified, subsequent encounter for fracture with malunion: Secondary | ICD-10-CM

## 2017-04-11 DIAGNOSIS — E039 Hypothyroidism, unspecified: Secondary | ICD-10-CM

## 2017-04-11 DIAGNOSIS — I82403 Acute embolism and thrombosis of unspecified deep veins of lower extremity, bilateral: Secondary | ICD-10-CM

## 2017-04-11 DIAGNOSIS — M899 Disorder of bone, unspecified: Secondary | ICD-10-CM

## 2017-04-11 DIAGNOSIS — I10 Essential (primary) hypertension: Secondary | ICD-10-CM

## 2017-04-11 DIAGNOSIS — C50411 Malignant neoplasm of upper-outer quadrant of right female breast: Secondary | ICD-10-CM

## 2017-04-11 DIAGNOSIS — D6859 Other primary thrombophilia: Secondary | ICD-10-CM

## 2017-04-11 DIAGNOSIS — F32A Depression, unspecified: Secondary | ICD-10-CM

## 2017-04-11 DIAGNOSIS — F329 Major depressive disorder, single episode, unspecified: Secondary | ICD-10-CM

## 2017-04-11 DIAGNOSIS — C7951 Secondary malignant neoplasm of bone: Secondary | ICD-10-CM

## 2017-04-11 DIAGNOSIS — Z170 Estrogen receptor positive status [ER+]: Secondary | ICD-10-CM

## 2017-04-11 DIAGNOSIS — E213 Hyperparathyroidism, unspecified: Secondary | ICD-10-CM

## 2017-04-11 DIAGNOSIS — M898X5 Other specified disorders of bone, thigh: Secondary | ICD-10-CM

## 2017-04-11 NOTE — Telephone Encounter (Signed)
This RN received call from pt requesting phone number for central scheduling " so I can schedule my PET scan ".  Number given per above.  2 additional messages then received - one from [facility] and the other from her friend " Alicia Vasquez " stating " there wasn't an order for a PET scan ". " Can Dr Jana Hakim order a PET scan ".  Noted order in system for CT of chest and bone scan ordered per initial work up for recurrent disease with requested date of September 2018.  Above will be reviewed with MD for clarification regarding which staging study is appropriate and then return call to the pt.

## 2017-04-12 ENCOUNTER — Other Ambulatory Visit: Payer: Self-pay

## 2017-04-12 ENCOUNTER — Other Ambulatory Visit: Payer: Self-pay | Admitting: *Deleted

## 2017-04-12 ENCOUNTER — Telehealth: Payer: Self-pay | Admitting: *Deleted

## 2017-04-12 DIAGNOSIS — M978XXD Periprosthetic fracture around other internal prosthetic joint, subsequent encounter: Secondary | ICD-10-CM | POA: Diagnosis not present

## 2017-04-12 DIAGNOSIS — Z471 Aftercare following joint replacement surgery: Secondary | ICD-10-CM | POA: Diagnosis not present

## 2017-04-12 DIAGNOSIS — T84011D Broken internal left hip prosthesis, subsequent encounter: Secondary | ICD-10-CM | POA: Diagnosis not present

## 2017-04-12 DIAGNOSIS — M9702XD Periprosthetic fracture around internal prosthetic left hip joint, subsequent encounter: Secondary | ICD-10-CM | POA: Diagnosis not present

## 2017-04-12 DIAGNOSIS — I10 Essential (primary) hypertension: Secondary | ICD-10-CM | POA: Diagnosis not present

## 2017-04-12 DIAGNOSIS — M1711 Unilateral primary osteoarthritis, right knee: Secondary | ICD-10-CM | POA: Diagnosis not present

## 2017-04-12 DIAGNOSIS — Z4789 Encounter for other orthopedic aftercare: Secondary | ICD-10-CM | POA: Diagnosis not present

## 2017-04-12 DIAGNOSIS — Z7901 Long term (current) use of anticoagulants: Secondary | ICD-10-CM | POA: Diagnosis not present

## 2017-04-12 DIAGNOSIS — Z96642 Presence of left artificial hip joint: Secondary | ICD-10-CM | POA: Diagnosis not present

## 2017-04-12 DIAGNOSIS — F418 Other specified anxiety disorders: Secondary | ICD-10-CM | POA: Diagnosis not present

## 2017-04-12 NOTE — Patient Outreach (Signed)
[facility]) Care Management  04/12/2017  [place] 08-04-1940 [id number]   Telephone Screen  Referral Date: 04/11/17 Referral Source: [place] Referral Reason: "multiple medical concerns including metastatic breast CA, HTN, depression and unspecified clotting disorder on long term Coumadin, currently untreated secondary to lack of understanding of diseases by pt, as well as poor psychosocial support" Insurance: Medicare   Outreach attempt #  1 to patient. No answer at present. RN CM left HIPAA compliant voicemail message along with contact info.    Plan: RN CM will make outreach attempt to patient within three business days if no return call from patient.   Enzo Montgomery, RN,BSN,CCM [place] [facility] Telephonic Care Management Coordinator Direct Phone: [phone] Toll Free: [phone] Fax: [phone]

## 2017-04-12 NOTE — Telephone Encounter (Signed)
"  I need Dr. [name] PA.  Never received a call back about appointments.  Radiology does not have order for PET scan to schedule.  Already had bone scan in April with my mammogram.  MRI ordered by [name] who I'm scheduled to see surgeon at 4:00 today.  F/U for August and September's hip surgeries resulting from cancer.  Physical therapy ended yesterday.  Not sure how to get out my house.  Three stairs.  Was never taught how to walk stairs with my walker.  Someone is coming to help me.  Any advice?  Three weeks ago had to call 911 to get back in.  Hand rails are too wide, far away."  Multiple questions answered in reference to test performed 09-20-2016.  Difference between Bone density and Bone scan, PET scan and Bone scan.  Imaging test ordered expected performance dates have passed, new orders may be needed to schedule  Verbalized understanding of Bone Scan no need for PET scan unless provider needs this test.   Call 911 for help getting in and out the house.  If weak,  Can't hold one or both rails, don't attempt stairs.

## 2017-04-12 NOTE — Patient Outreach (Signed)
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 Screening completed.  Social: Patient states she is currently residing in her friend(Katherine Harrogate) home. She states she has been staying there since she returned from rehab ans she is unable to care for herself alone. She voices that she is unsure how long she will be staying there. Patient states she does requires some assistance with all ADLs/IADLs. She reports that she "collapsed" about two weeks ago while she was trying to get in and out to go see MD. DME in the home include walker, BSC and wheelchair. Patient reports she is interested in getting a hospital bed as she thinks it will be safer and easier for her to get in/out of bed. Patient states she is currently sleeping on the living room sofa as that's the only way she can get up. She voices that today is her last day with HHPT via [facility]. She states that her legs continue to remain stiff and she can only walk short distances. Patient interested in possible resources to help her get a ramp so she can safely get around. She voices that she has had to cancel several appts including PCP appt yesterday and orhto appt today as she is unable to get out of the home.   Conditions: Per chart review patient has PMH of Beast CA with bone metastases, hyperparathyroidism, HTN, DVT, depression and clotting disorder. Patient reports she was diagnosed with Breast CA several years ago and underwent treatment but it has returned. She states it is  now in her bones which caused her to suffer hip fracture. She reports she has had both hips to fracture. She is waiting to hear back from MD office about further testing and scans as MD possibly feels cancer may be in other areas as well. She has ongoing pain but managed at present. Patient voices that her BP meds were stopped the last time she was rehab due to BP being low.    Medications: Patient taking less than 10 meds. She is currently managing her meds on her own. No issues at present.  Appointments: Patient followed by PCP (Dr. Juleen China), ortho(Dr. Ricard Dillon) and oncologist (Dr. Moss Mc). She has had to cancel all her appts recently due to transportation issues and being unable to get of the house safely.  Advance Directives: None. Patient interested in further info as she realizes she needs to have something in place given her medical conditions.   Consent: [facility] services reviewed and discussed with patient. Patient kept asking and making sure that this was not "hospice" as she does not want that at this time. Reassured patient we are not hospice and further explained our services. Verbal consent received from patient. She voices ongoing issues with her cell phone and requested that [facility] staff also try to reach her at [facility] [place](friend) at [phone]. Patient gave consent for staff to speak with friend. Her address is [address] [place] [address].   Plan: RN CM will notify [facility] administrative assistant of case status. RN CM will send referral to [place] for further in home eval/assessment of care  needs and management of chronic conditions. RN CM will send [facility] SW referral for possible assistance with community resources related to transportation, ramp assistance and advance directives.   Enzo Montgomery, RN,BSN,CCM [place] [facility] Telephonic Care Management Coordinator Direct Phone: [phone] Toll Free: [phone] Fax: [phone]

## 2017-04-13 ENCOUNTER — Encounter: Payer: Self-pay | Admitting: *Deleted

## 2017-04-13 ENCOUNTER — Telehealth: Payer: Self-pay | Admitting: *Deleted

## 2017-04-13 NOTE — Telephone Encounter (Signed)
This RN received call from [facility]

## 2017-04-14 ENCOUNTER — Telehealth: Payer: Self-pay

## 2017-04-14 ENCOUNTER — Other Ambulatory Visit: Payer: Self-pay | Admitting: *Deleted

## 2017-04-14 DIAGNOSIS — F418 Other specified anxiety disorders: Secondary | ICD-10-CM | POA: Diagnosis not present

## 2017-04-14 DIAGNOSIS — T84011D Broken internal left hip prosthesis, subsequent encounter: Secondary | ICD-10-CM | POA: Diagnosis not present

## 2017-04-14 DIAGNOSIS — M9702XD Periprosthetic fracture around internal prosthetic left hip joint, subsequent encounter: Secondary | ICD-10-CM | POA: Diagnosis not present

## 2017-04-14 DIAGNOSIS — Z7901 Long term (current) use of anticoagulants: Secondary | ICD-10-CM | POA: Diagnosis not present

## 2017-04-14 DIAGNOSIS — I10 Essential (primary) hypertension: Secondary | ICD-10-CM | POA: Diagnosis not present

## 2017-04-14 DIAGNOSIS — M1711 Unilateral primary osteoarthritis, right knee: Secondary | ICD-10-CM | POA: Diagnosis not present

## 2017-04-14 NOTE — Telephone Encounter (Signed)
Pt called inquiring about need for bone scan appointment.    Upon investigation pt has missed her last several appt's for labs, injection and MD. Pt also missed her XRT consult appt in Sept.    Pt has not been seen since Aug. 2018.  Pt states she was not aware of any of these appt's.  Pt has been in rehab for hip replacement surgery and also has had issues with her cell phone not working.    Pt provided a new contact number of [phone]  This RN sent high priority msg to scheduling dept to get pt rescheduled for XRT consult, labs, MD and injection and provided the new contact number for pt.  This RN called CS and got bone scan and CT chest scheduled for pt on 05/02/17 starting at 1000.  Pt has been instructed to not eat or drink anything for at least 4 hours prior to CT chest other than to take her routine morning medications.  Pt verbalizes understanding of instructions as well as appt date and times.  Pt also informed to expect a call from our scheduling team to get her rescheduled for above mentioned missed appts.

## 2017-04-14 NOTE — Patient Outreach (Signed)
[facility]) Care Management  04/14/2017  [place] January 23, 1941 [id number]    RN received a referral on 11/14   RN attempted outreach call to pt today however unsuccessful with the call. RN able to leave a HIPAA approved voice message requesting a call back. Will inquire further at that time on possible needs and [facility] services.  Raina Mina, RN Care Management Coordinator [place] Office [phone]

## 2017-04-17 ENCOUNTER — Telehealth: Payer: Self-pay | Admitting: Oncology

## 2017-04-17 ENCOUNTER — Other Ambulatory Visit: Payer: Self-pay | Admitting: *Deleted

## 2017-04-17 DIAGNOSIS — F418 Other specified anxiety disorders: Secondary | ICD-10-CM | POA: Diagnosis not present

## 2017-04-17 DIAGNOSIS — I10 Essential (primary) hypertension: Secondary | ICD-10-CM | POA: Diagnosis not present

## 2017-04-17 DIAGNOSIS — M1711 Unilateral primary osteoarthritis, right knee: Secondary | ICD-10-CM | POA: Diagnosis not present

## 2017-04-17 DIAGNOSIS — M9702XD Periprosthetic fracture around internal prosthetic left hip joint, subsequent encounter: Secondary | ICD-10-CM | POA: Diagnosis not present

## 2017-04-17 DIAGNOSIS — T84011D Broken internal left hip prosthesis, subsequent encounter: Secondary | ICD-10-CM | POA: Diagnosis not present

## 2017-04-17 DIAGNOSIS — Z7901 Long term (current) use of anticoagulants: Secondary | ICD-10-CM | POA: Diagnosis not present

## 2017-04-17 NOTE — Telephone Encounter (Signed)
Spoke with patient re lab/fu/inj tomorrow. Patient scheduled w/LC, however she request GM be present. Patient also has question re injection and is in pain (per patient).  Patient will discuss rescheduling appointments with radonc at tomorrow's visit. Spoke with desk nurse re above.

## 2017-04-17 NOTE — Patient Outreach (Signed)
[facility]) Care Management  04/17/2017  [place] 1941/04/22 [id number]    RN 2nd outreach attempt unsuccessful. RN contacted the provided number and was only able to leave a HIPAA approved voice message requesting a call back. RN also attempt to call the home number however was disconnected. Will attempt to connect with pt on tomorrow for possible [facility] services.  Raina Mina, RN Care Management Coordinator [place] Office [phone]

## 2017-04-17 NOTE — Progress Notes (Addendum)
[place]  Telephone:(336) [phone] Fax:(336) [phone]     ID: Alicia Vasquez OB: 05/17/1941  MR#: [id number]  WEX#:[id number]  PCP: Briscoe Deutscher, DO GYN:   SU: [name] OTHER MD: Thea Silversmith, Hart Robinsons, Philemon Kingdom  CHIEF COMPLAINT: Estrogen receptor positive breast cancer  CURRENT TREATMENT:  To start fulvestrant, palbociclib   INTERVAL HISTORY: Alicia Vasquez is here today for evaluation of her metastatic breast cancer.  She is here with her friends Curt Bears and [name].  She lives with [place] currently.  She has undergone two hip replacements, the last one being in September.  She was subsequently went to rehab.  She was discharged from rehab and moved in with Curt Bears in October.  She has continued to work with PT and will be discharged tomorrow from their service.    Alicia Vasquez is here today to discuss a treatment plan for her newly diagnosed metastatic breast cancer.    REVIEW OF SYSTEMS: Alicia Vasquez is doing moderately well today.  She does have left leg pain and is taking Norco for this pain.  It is controlling her pain.  She is having normal bowel movements.  She does require assistance with many of her activities of daily living.  Her friends [name] and [name] help her with these things, or she has assistive devices in place to help her (such as a bed side commode).  She is doing well, and a detailed ROS is otherwise non contributory.   BREAST CANCER HISTORY: From doctor [name]'s intake node 07/24/2013:  "76 y.o. female. Who presented with SOB and had a CT chest performed that revealed a right breast mass. Mammogram/ultrsound on 2/13 showed a mass in the 11 o'clock position in the right breast measuring 1.5 cm. Also noted was a right axillary LN measuring 1.6 cm. MRI not performed. Biopsy of mass and lymph done. 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 Lymph node + for metastatic carcinoma."  [On 09/09/2013 the patient underwent right lumpectomy and sentinel lymph node sampling. This showed (SZA [id number]) multifocal invasive ductal carcinoma, grade 1, the largest lesion measuring 1.8 cm, the second lesion 1.2 cm. One of 4 sentinel lymph nodes was positive, with extracapsular extension. Margins were positive. HER-2 was repeated and was again negative. Further surgery 09/16/2013 obtained clear margins.  Her subsequent history is as detailed below  PAST MEDICAL HISTORY: Past Medical History:  Diagnosis Date  . Allergy   . Anxiety   . Arthritis   . Blood transfusion without reported diagnosis   . Breast cancer ([place]) 07/12/2013   Invasive Mammary Carcinoma  . DVT (deep vein thrombosis) in pregnancy ([place])   . Hypertension   . Hypothyroid   . Personal history of radiation therapy   . Pneumonia   . Radiation 11/21/13-01/07/14   Right Breast/Supraclavicular    PAST SURGICAL HISTORY: Past Surgical History:  Procedure Laterality Date  . BREAST LUMPECTOMY WITH RADIOACTIVE SEED LOCALIZATION Right 09/09/2013   Procedure: BREAST LUMPECTOMY WITH RADIOACTIVE SEED LOCALIZATION WITH AXILLARY NODE EXCISION;  Surgeon: [name], MD;  Location: [facility];  Service: General;  Laterality: Right;  . DENTAL SURGERY  04/19/2012   13 TEETH REMOVED  . DILATION AND CURETTAGE OF UTERUS    . ORIF PERIPROSTHETIC FRACTURE Left 01/31/2017   Procedure: REVISION and OPEN REDUCTION INTERNAL FIXATION (ORIF) PERIPROSTHETIC FRACTURE LEFT HIP;  Surgeon: Paralee Cancel, MD;  Location: [facility];  Service: Orthopedics;  Laterality: Left;  120 mins  . RE-EXCISION OF BREAST LUMPECTOMY Right 09/24/2013   Procedure: RE-EXCISION OF RIGHT BREAST LUMPECTOMY;  Surgeon: [name], MD;  Location: [facility];  Service: General;  Laterality: Right;  . TOTAL HIP ARTHROPLASTY Left 01/16/2017   Procedure: TOTAL HIP ARTHROPLASTY POSTERIOR;   Surgeon: Paralee Cancel, MD;  Location[facility];  Service: Orthopedics;  Laterality: Left;    FAMILY HISTORY Family History  Problem Relation Age of Onset  . Heart disease Brother   . Colon cancer Brother   . Prostate cancer Brother    the patient's father died at the age of 61 after an automobile accident. The patient's mother died at the age of 30. She was a Marine scientist here in [name] in the old [facility]. She was infected with polio and was confined to a wheelchair for a good part of her life. She eventually died of pneumonia. The patient had one brother, who died with prostate cancer. She had no sisters. There is no history of breast or ovarian cancer in the family.  GYNECOLOGIC HISTORY:  Menarche age 66, first live birth age 60, the patient is GX P1. She went through the change of life at age 67. 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 She is widowed. Currently she is staying with her friend [name],  87,who is a retired Radio producer. The patient's son frank lives in [place]. He works an Engineer, technical sales. The patient has no grandchildren. She is a Tourist information centre manager but currently attends a [employer] with her friend [name]    ADVANCED DIRECTIVES: Not in place   HEALTH MAINTENANCE: Social History   Tobacco Use  . Smoking status: Never Smoker  . Smokeless tobacco: Never Used  Substance Use Topics  . Alcohol use: No    Alcohol/week: 0.0 oz  . Drug use: No     Colonoscopy: Never  PAP:  Bone density: 10/01/2009; lowest T score -0.8  Lipid panel:  Allergies  Allergen Reactions  . Anesthetics, [place] Hypertension  . Benadryl [Diphenhydramine Hcl] Other (See Comments)    Dizziness  . Carbocaine [Mepivacaine Hcl] Hypertension  . Codeine Other (See Comments)    Dizziness  . Epinephrine Hypertension  . Sulfa Antibiotics Other (See Comments)    dizziness  . Latex Other (See Comments)    Blisters  in mouth  . Tramadol     Sedation.   Marland Kitchen Penicillins Rash    Has patient had a PCN reaction causing immediate rash, facial/tongue/throat swelling, SOB or lightheadedness with hypotension: Unknown Has patient had a PCN reaction causing severe rash involving mucus membranes or skin necrosis: Unknown Has patient had a PCN reaction that required hospitalization: Unknown Has patient had a PCN reaction occurring within the last 10 years: Unknown If all of the above answers are "NO", then may proceed with Cephalosporin use.     Current Outpatient Medications  Medication Sig Dispense Refill  . Cholecalciferol (VITAMIN D3) 5000 UNITS TABS Take 5,000 Units by mouth daily.     Marland Kitchen HYDROcodone-acetaminophen (NORCO/VICODIN) 5-325 MG tablet Take 1 tablet by mouth every 8 (eight) hours as needed for moderate pain. 120 tablet 0  . levothyroxine (SYNTHROID) 125 MCG tablet Take 1 tablet (125 mcg total) by mouth daily before breakfast. Dispense as written: Synthroid LAST REFILL 90 tablet 3  . methocarbamol (ROBAXIN) 500 MG tablet Take 1 tablet (500 mg total) by mouth  every 6 (six) hours as needed for muscle spasms. 40 tablet 0  . polyethylene glycol (MIRALAX / GLYCOLAX) packet Take 17 g by mouth 2 (two) times daily. 14 each 0  . warfarin (JANTOVEN) 5 MG tablet Take 1 tablet (5 mg total) by mouth daily at 6 PM. 30 tablet 1  . docusate sodium (COLACE) 100 MG capsule Take 1 capsule (100 mg total) by mouth 2 (two) times daily. (Patient not taking: Reported on 04/18/2017) 10 capsule 0  . ferrous sulfate 325 (65 FE) MG tablet Take 1 tablet (325 mg total) by mouth 3 (three) times daily after meals. (Patient not taking: Reported on 04/18/2017) 30 tablet 0  . ketoconazole (NIZORAL) 2 % cream Apply 1 application topically daily. 15 g 0   No current facility-administered medications for this visit.     OBJECTIVE:   Vitals:   04/18/17 1425  BP: (!) 146/62  Pulse: 95  Resp: (!) 24  Temp: 97.8 F (36.6 C)  SpO2: 94%       Body mass index is 35.19 kg/m.    ECOG FS:3 GENERAL: Patient is a chronically ill appearing woman sitting in wheelchair HEENT:  Sclerae anicteric.  Oropharynx clear and moist. No ulcerations or evidence of oropharyngeal candidiasis. Neck is supple.  NODES:  No cervical, supraclavicular, or axillary lymphadenopathy palpated.  BREAST EXAM:  Deferred. LUNGS:  Clear to auscultation bilaterally.  No wheezes or rhonchi. HEART:  Regular rate and rhythm. No murmur appreciated. ABDOMEN:  Soft, nontender.  Positive, normoactive bowel sounds. No organomegaly palpated. MSK:  No focal spinal tenderness to palpation. Marland Kitchen EXTREMITIES:  No peripheral edema.   SKIN:  Clear with no obvious rashes or skin changes. No nail dyscrasia. NEURO:  Nonfocal. Well oriented.  Appropriate affect.    LAB RESULTS:  CMP     Component Value Date/Time   NA 140 04/18/2017 1401   K 4.6 04/18/2017 1401   CL 106 02/02/2017 0545   CO2 28 04/18/2017 1401   GLUCOSE 112 04/18/2017 1401   BUN 16.9 04/18/2017 1401   CREATININE 0.9 04/18/2017 1401   CALCIUM 11.5 (H) 04/18/2017 1401   PROT 6.9 04/18/2017 1401   ALBUMIN 3.3 (L) 04/18/2017 1401   AST 9 04/18/2017 1401   ALT 9 04/18/2017 1401   ALKPHOS 117 04/18/2017 1401   BILITOT 0.22 04/18/2017 1401   GFRNONAA >60 02/02/2017 0545   GFRNONAA 75 08/19/2015 1602   GFRAA >60 02/02/2017 0545   GFRAA 87 08/19/2015 1602    I No results found for: SPEP  Lab Results  Component Value Date   WBC 7.9 04/18/2017   NEUTROABS 5.4 04/18/2017   HGB 11.1 (L) 04/18/2017   HCT 35.0 04/18/2017   MCV 80.8 04/18/2017   PLT 352 04/18/2017      Chemistry      Component Value Date/Time   NA 140 04/18/2017 1401   K 4.6 04/18/2017 1401   CL 106 02/02/2017 0545   CO2 28 04/18/2017 1401   BUN 16.9 04/18/2017 1401   CREATININE 0.9 04/18/2017 1401      Component Value Date/Time   CALCIUM 11.5 (H) 04/18/2017 1401   ALKPHOS 117 04/18/2017 1401   AST 9 04/18/2017 1401   ALT 9  04/18/2017 1401   BILITOT 0.22 04/18/2017 1401       No results found for: LABCA2  No components found for: LABCA125  Recent Labs  Lab 04/18/17 1401  INR 1.70*    Urinalysis    Component Value Date/Time  COLORURINE STRAW (A) 01/15/2017 2000   APPEARANCEUR CLEAR 01/15/2017 2000   LABSPEC 1.008 01/15/2017 2000   LABSPEC 1.010 09/22/2016 1553   PHURINE 6.0 01/15/2017 2000   GLUCOSEU NEGATIVE 01/15/2017 2000   GLUCOSEU Negative 09/22/2016 1553   HGBUR SMALL (A) 01/15/2017 2000   BILIRUBINUR NEGATIVE 01/15/2017 2000   BILIRUBINUR Negative 01/06/2017 1608   BILIRUBINUR Negative 09/22/2016 [place] 01/15/2017 2000   PROTEINUR NEGATIVE 01/15/2017 2000   UROBILINOGEN 0.2 01/06/2017 1608   UROBILINOGEN 0.2 11/15/2016 1358   UROBILINOGEN 0.2 09/22/2016 1553   NITRITE NEGATIVE 01/15/2017 2000   LEUKOCYTESUR NEGATIVE 01/15/2017 2000   LEUKOCYTESUR Negative 09/22/2016 1553   Results for DALILA, ARCA (MRN [id number]) as of 06/23/2016 17:38  Ref. Range 03/24/2016 15:15 04/07/2016 14:40 04/20/2016 14:33 04/27/2016 14:34 05/05/2016 14:46 05/19/2016 14:27 05/26/2016 14:09 06/23/2016 14:25  INR Latest Ref Range: 2.00 - 3.50  1.90 (L) 2.60 1.30 (L) 3.70 (H) 2.60 3.80 (H) 1.80 (L) 2.90   STUDIES: No results found.  ASSESSMENT: 76 y.o. [place] woman status post right breast upper outer quadrant lumpectomy and sentinel lymph node sampling 09/09/2013 for an mpT1c pN1a, stage IIA invasive ductal carcinoma, estrogen and progesterone receptor both 100% positive with strong staining intensity, MIB-1 of 17% and no HER-2 amplification  (1) additional surgery for margin clearance 09/16/2013 obtained negative margins  (2) Oncotype DX recurrence score of 4 predicts a risk of outside the breast recurrence within 10 years of 7% if the patient's only systemic therapy is tamoxifen for 5 years. It also predicts no benefit from chemotherapy  (3) adjuvant radiation completed  01/07/2014  (4) anastrozole started 02/27/2014 stopped within 2 weeks because of arm swelling.   (a) bone density April 2016 showed osteopenia, with a t-score of -1.6  (b) anastrozole resumed 12/17/2015  (5) history of left lower extremity DVT 11/23/2012, initially on rivaroxaban, which caused chest pain, switch to Coumadin July 2014   METASTATIC DISEASE: August 2018 (6) status post left total hip replacement 01/16/2017 for estrogen receptor positive adenocarcinoma.  PLAN:   Lorenda is doing well today.  She met with Dr. Jana Hakim today as well who reviewed a plan with her.  She will undergo Fulvestrant 541m monthly, in addition to X[place]  She will start these today.  He reviewed these medications in detail with her, in addition to her treatment plan.  She verbalized understanding.  She has a bone scan and a CT chest due on 12/4.  These will serve as her baseline studies.  She will be re-staged in March again to determine how she is doing with her treatment.    I will refill Karimah's Norco today.  I instructed her that from here on out, it is important that she receive her pain medication from myself or Dr. mJana Hakimat the cancer center.  This way we can be sure that she is on the correct medication for her pain and that her pain is adequately controlled.  I also refilled her Warfarin today.  She will stay on flat dosing of 569mper day and we will check her INR monthly.    MaRosalindaill return in 4 weeks for labs, f/u with me and her next Fulvestrant/Xgeva.  She will f/u in 8 weeks for labs, f/u [facility] I placed these scheduling requests today.  She and her friends know to call for any questions or concerns prior to her next appointment with [place]     LiSuzan Garibaldi  Arrietty Dercole, NP   04/19/2017 1:28 AM    ADDENDUM: It has been difficult to get married to, but I think we are making progress in getting her transportation.  We are going to do the fulvestrant monthly without a  week to boost her dose for that particular reason.  She and her friends, who are her primary caregivers, have a good understanding of the possible toxicity side effects and complications of these treatments as well as the goal, which is control.  I personally saw this patient and performed a substantive portion of this encounter with the listed APP documented above.   Chauncey Cruel, MD Medical Oncology and Hematology [facility] [address] [place], [place] [address] Tel. [phone]    Fax. [phone]

## 2017-04-18 ENCOUNTER — Telehealth: Payer: Self-pay | Admitting: Adult Health

## 2017-04-18 ENCOUNTER — Ambulatory Visit (HOSPITAL_BASED_OUTPATIENT_CLINIC_OR_DEPARTMENT_OTHER): Payer: Medicare Other | Admitting: Adult Health

## 2017-04-18 ENCOUNTER — Encounter: Payer: Self-pay | Admitting: Adult Health

## 2017-04-18 ENCOUNTER — Other Ambulatory Visit: Payer: Self-pay | Admitting: *Deleted

## 2017-04-18 ENCOUNTER — Ambulatory Visit (HOSPITAL_BASED_OUTPATIENT_CLINIC_OR_DEPARTMENT_OTHER): Payer: Medicare Other

## 2017-04-18 ENCOUNTER — Other Ambulatory Visit (HOSPITAL_BASED_OUTPATIENT_CLINIC_OR_DEPARTMENT_OTHER): Payer: Medicare Other

## 2017-04-18 ENCOUNTER — Encounter: Payer: Self-pay | Admitting: *Deleted

## 2017-04-18 VITALS — BP 146/62 | HR 95 | Temp 97.8°F | Resp 24 | Ht 64.0 in

## 2017-04-18 DIAGNOSIS — C7951 Secondary malignant neoplasm of bone: Secondary | ICD-10-CM

## 2017-04-18 DIAGNOSIS — Z170 Estrogen receptor positive status [ER+]: Secondary | ICD-10-CM

## 2017-04-18 DIAGNOSIS — B372 Candidiasis of skin and nail: Secondary | ICD-10-CM | POA: Diagnosis not present

## 2017-04-18 DIAGNOSIS — M899 Disorder of bone, unspecified: Secondary | ICD-10-CM

## 2017-04-18 DIAGNOSIS — G893 Neoplasm related pain (acute) (chronic): Secondary | ICD-10-CM | POA: Diagnosis not present

## 2017-04-18 DIAGNOSIS — C50411 Malignant neoplasm of upper-outer quadrant of right female breast: Secondary | ICD-10-CM

## 2017-04-18 DIAGNOSIS — Z86711 Personal history of pulmonary embolism: Secondary | ICD-10-CM

## 2017-04-18 DIAGNOSIS — M84659P Pathological fracture in other disease, hip, unspecified, subsequent encounter for fracture with malunion: Secondary | ICD-10-CM

## 2017-04-18 DIAGNOSIS — Z5111 Encounter for antineoplastic chemotherapy: Secondary | ICD-10-CM | POA: Diagnosis present

## 2017-04-18 DIAGNOSIS — I82403 Acute embolism and thrombosis of unspecified deep veins of lower extremity, bilateral: Secondary | ICD-10-CM

## 2017-04-18 DIAGNOSIS — M8580 Other specified disorders of bone density and structure, unspecified site: Secondary | ICD-10-CM

## 2017-04-18 DIAGNOSIS — Z7189 Other specified counseling: Secondary | ICD-10-CM

## 2017-04-18 LAB — COMPREHENSIVE METABOLIC PANEL
ALT: 9 U/L (ref 0–55)
ANION GAP: 8 meq/L (ref 3–11)
AST: 9 U/L (ref 5–34)
Albumin: 3.3 g/dL — ABNORMAL LOW (ref 3.5–5.0)
Alkaline Phosphatase: 117 U/L (ref 40–150)
BILIRUBIN TOTAL: 0.22 mg/dL (ref 0.20–1.20)
BUN: 16.9 mg/dL (ref 7.0–26.0)
CHLORIDE: 104 meq/L (ref 98–109)
CO2: 28 meq/L (ref 22–29)
CREATININE: 0.9 mg/dL (ref 0.6–1.1)
Calcium: 11.5 mg/dL — ABNORMAL HIGH (ref 8.4–10.4)
EGFR: >60 mL/min/{1.73_m2} (ref >60)
GLUCOSE: 112 mg/dL (ref 70–140)
Potassium: 4.6 mEq/L (ref 3.5–5.1)
SODIUM: 140 meq/L (ref 136–145)
TOTAL PROTEIN: 6.9 g/dL (ref 6.4–8.3)

## 2017-04-18 LAB — PROTIME-INR
INR: 1.7 — AB (ref 2.00–3.50)
Protime: 20.4 Seconds — ABNORMAL HIGH (ref 10.6–13.4)

## 2017-04-18 LAB — CBC WITH DIFFERENTIAL/PLATELET
BASO%: 1.1 % (ref 0.0–2.0)
Basophils Absolute: 0.1 10*3/uL (ref 0.0–0.1)
EOS%: 2 % (ref 0.0–7.0)
Eosinophils Absolute: 0.2 10*3/uL (ref 0.0–0.5)
HEMATOCRIT: 35 % (ref 34.8–46.6)
HEMOGLOBIN: 11.1 g/dL — AB (ref 11.6–15.9)
LYMPH#: 1.1 10*3/uL (ref 0.9–3.3)
LYMPH%: 14.1 % (ref 14.0–49.7)
MCH: 25.6 pg (ref 25.1–34.0)
MCHC: 31.6 g/dL (ref 31.5–36.0)
MCV: 80.8 fL (ref 79.5–101.0)
MONO#: 1.2 10*3/uL — ABNORMAL HIGH (ref 0.1–0.9)
MONO%: 14.6 % — AB (ref 0.0–14.0)
NEUT#: 5.4 10*3/uL (ref 1.5–6.5)
NEUT%: 68.2 % (ref 38.4–76.8)
Platelets: 352 10*3/uL (ref 145–400)
RBC: 4.34 10*6/uL (ref 3.70–5.45)
RDW: 16.4 % — AB (ref 11.2–14.5)
WBC: 7.9 10*3/uL (ref 3.9–10.3)

## 2017-04-18 MED ORDER — KETOCONAZOLE 2 % EX CREA: 1.0000 "application " | TOPICAL_CREAM | Freq: Every day | CUTANEOUS | 0 refills | Status: DC

## 2017-04-18 MED ORDER — WARFARIN SODIUM 5 MG PO TABS: 5.0000 mg | ORAL_TABLET | Freq: Every day | ORAL | 1 refills | Status: DC

## 2017-04-18 MED ORDER — FULVESTRANT 250 MG/5ML IM SOLN
500.0000 mg | Freq: Once | INTRAMUSCULAR | Status: AC
  Administered 2017-04-18: 500 mg via INTRAMUSCULAR
  Filled 2017-04-18: qty 10

## 2017-04-18 MED ORDER — HYDROCODONE-ACETAMINOPHEN 5-325 MG PO TABS: 1.0000 | ORAL_TABLET | Freq: Three times a day (TID) | ORAL | 0 refills | Status: DC | PRN

## 2017-04-18 NOTE — Telephone Encounter (Signed)
Gave patient avs and calendar with appts per 11/20 los.

## 2017-04-18 NOTE — Patient Outreach (Addendum)
[facility]) Care Management  04/18/2017  Sholanda Croson [place] 09/10/1940 [id number]  Telephone Assessment (3rd attempt)  RN 3rd outreach attempt unsuccessful however RN able to leave a HIPAA approved voice message requesting a call back. Will request outreach letter to be sent to pt and notify other discipline if not response before closure.   Raina Mina, RN Care Management Coordinator [place] Office [phone]

## 2017-04-18 NOTE — Patient Outreach (Signed)
[facility]) Care Management  04/18/2017  [place] [birth year] [id number]   CSW made an initial attempt to try and contact patient today to perform phone assessment, as well as assess and assist with social needs and services, without success.  A HIPAA compliant message was left for patient on voicemail.  CSW is currently awaiting a return call. CSW will make a second outreach attempt within the next week, if CSW does not receive a return call from patient in the meantime. Nat Christen, BSW, MSW, LCSW  Licensed Education officer, environmental Health System  Mailing [place] [address] [place], [place] [address] Physical Address-[address], [place], [place] [address] Toll Free Main # [phone] Fax # [phone] Cell # [phone]  Office # [phone] [email or web address]

## 2017-04-18 NOTE — Patient Instructions (Signed)

## 2017-04-19 DIAGNOSIS — T84011D Broken internal left hip prosthesis, subsequent encounter: Secondary | ICD-10-CM | POA: Diagnosis not present

## 2017-04-19 DIAGNOSIS — F418 Other specified anxiety disorders: Secondary | ICD-10-CM | POA: Diagnosis not present

## 2017-04-19 DIAGNOSIS — M1711 Unilateral primary osteoarthritis, right knee: Secondary | ICD-10-CM | POA: Diagnosis not present

## 2017-04-19 DIAGNOSIS — Z7901 Long term (current) use of anticoagulants: Secondary | ICD-10-CM | POA: Diagnosis not present

## 2017-04-19 DIAGNOSIS — M9702XD Periprosthetic fracture around internal prosthetic left hip joint, subsequent encounter: Secondary | ICD-10-CM | POA: Diagnosis not present

## 2017-04-19 DIAGNOSIS — I10 Essential (primary) hypertension: Secondary | ICD-10-CM | POA: Diagnosis not present

## 2017-04-19 DIAGNOSIS — Z7189 Other specified counseling: Secondary | ICD-10-CM | POA: Insufficient documentation

## 2017-04-19 LAB — CANCER ANTIGEN 27.29: CA 27.29: 46.4 U/mL — ABNORMAL HIGH (ref 0.0–38.6)

## 2017-04-26 ENCOUNTER — Encounter: Payer: Self-pay | Admitting: *Deleted

## 2017-04-26 ENCOUNTER — Other Ambulatory Visit: Payer: Self-pay | Admitting: *Deleted

## 2017-04-26 NOTE — Patient Outreach (Signed)
[facility]) Care Management  04/26/2017  [name] Jun 27, 1940 [id number]   CSW was able to make initial contact with patient today to perform phone assessment, as well as assess and assist with social work needs and services.  CSW introduced self, explained role and types of services provided through [place] [facility] ([place] Management).  CSW further explained to patient that CSW works with patient's RNCM, also with [place] Management, Raina Mina. CSW then explained the reason for the call, indicating that Ms. Zigmund Daniel thought that patient would benefit from social work services and resources to assist with arranging transportation to and from physician appointments, obtaining services to assist with wheelchair ramp installation and assistance with completion of Advanced Directives ([place] documents).  CSW obtained two HIPAA compliant identifiers from patient, which included patient's name and date of birth. Patient admitted that she is able to get out of her house, but unable to get back in, as she is somewhat wheelchair bound and unable to ambulate without assistance.  CSW explained to patient that CSW can refer patient to several agencies that may be able to assist with wheelchair ramp installation.  These agencies include all of the following: [place] Patient was agreeable to having CSW make these referrals, as well as assist with completion of applications.  CSW provided patient with the contact information for each of these agencies so that patient can contact them periodically to check the status of her applications. In addition, patient reported that she needs assistance with transportation to and from her physician appointments, as patient is currently having to rely on family members and friends to transport her.   CSW spoke with patient at length about SCAT Paramedic) through [employer]).  Patient believes that she would be able to utilize this type of transportation resource, but only if they are willing to pick her up at her front door.  CSW explained that we will not know for sure unless we apply and eligibility for services is determined.  Patient was agreeable to having CSW mail her a SCAT application, then assist with completion, if necessary. Last, patient reported that she is interested in completing her Advanced Directives, as she verbalized the importance of having these documents in place.  CSW agreed to mail patient an Advanced Directives packet for her to review.  CSW agreed to assist with completion of documents, if patient is agreeable.  CSW will mail the resource packet to patient today and then follow-up with patient in two weeks to ensure that she received, as well as answer any questions that she may have at that time.  CSW agreed to schedule a home visit with patient if assistance is needed with completion of applications and Advanced Directives.  Patient voiced understanding and was agreeable to this plan. [facility] CM Care Plan Problem One     Most Recent Value  Care Plan Problem One  Lack of transportation to and from physician appointments.  Role Documenting the Problem One  Clinical Social Worker  Care Plan for Problem One  Active  [facility] Long Term Goal   Patient will apply for SCAT to assist with transportation to and from physician appointments, within the next 45 days.  [facility] Long Term Goal Start Date  04/26/17  Interventions for Problem One Long Term Goal  CSW will mail patient a SCAT application and  assist with completion, if necessary.    [facility] CM Care Plan Problem Two     Most Recent Value  Care Plan Problem Two  Lack of wheelchair ramp to enter and exit the home.  Role Documenting the Problem Two  Clinical Social Worker  Care Plan for  Problem Two  Active  [facility] CM Short Term Goal #1   Patient will follow-up with community agencies regarding wheelchair ramp installation, within the next three weeks.  [facility] CM Short Term Goal #1 Start Date  04/26/17  Interventions for Short Term Goal #2   CSW has reviewed patient to community agencies that may be able to assist with wheelchair ramp installation.    [facility] CM Care Plan Problem Three     Most Recent Value  Care Plan Problem Three  Lack of Advanced Directives.  Role Documenting the Problem Three  Clinical Social Worker  Care Plan for Problem Three  Active  [facility] CM Short Term Goal #1   Patient will have Living Will and [place] documents in place, within the next three weeks.  [facility] CM Short Term Goal #1 Start Date  04/26/17  Interventions for Short Term Goal #1  CSW will mail an Advanced Directives packet to patient's home and assist with completion, if necessary.     Nat Christen, BSW, MSW, LCSW  Licensed Education officer, environmental Health System  Mailing [place] [address], [place], [place] [address] Physical Address-[address], [place], [place] [address] Toll Free Main # [phone] Fax # [phone] Cell # [phone]  Office # [phone] [email or web address]

## 2017-04-26 NOTE — Patient Outreach (Signed)
Request received from [facility]. LCSW to mail patient personal care resources.  Information mailed today.

## 2017-04-26 NOTE — Patient Outreach (Signed)
Request received from Joanna Saporito, LCSW to mail patient personal care resources.  Information mailed today. 

## 2017-04-27 NOTE — Telephone Encounter (Signed)
No entry 

## 2017-04-28 ENCOUNTER — Ambulatory Visit: Payer: Self-pay | Admitting: *Deleted

## 2017-05-02 ENCOUNTER — Encounter (HOSPITAL_COMMUNITY)
Admission: RE | Admit: 2017-05-02 | Discharge: 2017-05-02 | Disposition: A | Discharge status: Home / Self Care | Payer: Medicare Other | Source: Ambulatory Visit | Attending: Oncology | Admitting: Oncology

## 2017-05-02 ENCOUNTER — Ambulatory Visit (HOSPITAL_COMMUNITY)
Admission: RE | Admit: 2017-05-02 | Discharge: 2017-05-02 | Disposition: A | Discharge status: Home / Self Care | Payer: Medicare Other | Source: Ambulatory Visit | Attending: Oncology | Admitting: Oncology

## 2017-05-02 DIAGNOSIS — C50411 Malignant neoplasm of upper-outer quadrant of right female breast: Secondary | ICD-10-CM

## 2017-05-02 DIAGNOSIS — G893 Neoplasm related pain (acute) (chronic): Secondary | ICD-10-CM | POA: Insufficient documentation

## 2017-05-02 DIAGNOSIS — Z170 Estrogen receptor positive status [ER+]: Principal | ICD-10-CM

## 2017-05-02 DIAGNOSIS — C50919 Malignant neoplasm of unspecified site of unspecified female breast: Secondary | ICD-10-CM | POA: Diagnosis not present

## 2017-05-02 DIAGNOSIS — R911 Solitary pulmonary nodule: Secondary | ICD-10-CM | POA: Diagnosis not present

## 2017-05-02 DIAGNOSIS — C7951 Secondary malignant neoplasm of bone: Secondary | ICD-10-CM | POA: Insufficient documentation

## 2017-05-02 DIAGNOSIS — C50911 Malignant neoplasm of unspecified site of right female breast: Secondary | ICD-10-CM | POA: Diagnosis present

## 2017-05-02 DIAGNOSIS — Y838 Other surgical procedures as the cause of abnormal reaction of the patient, or of later complication, without mention of misadventure at the time of the procedure: Secondary | ICD-10-CM | POA: Insufficient documentation

## 2017-05-02 DIAGNOSIS — I700 Atherosclerosis of aorta: Secondary | ICD-10-CM | POA: Diagnosis not present

## 2017-05-02 DIAGNOSIS — T8484XD Pain due to internal orthopedic prosthetic devices, implants and grafts, subsequent encounter: Secondary | ICD-10-CM | POA: Insufficient documentation

## 2017-05-02 MED ORDER — TECHNETIUM TC 99M MEDRONATE IV KIT
20.3000 | PACK | Freq: Once | INTRAVENOUS | Status: AC | PRN
  Administered 2017-05-02: 20.3 via INTRAVENOUS

## 2017-05-02 MED ORDER — IOPAMIDOL (ISOVUE-300) INJECTION 61%
INTRAVENOUS | Status: AC
  Administered 2017-05-02: 75 mL
  Filled 2017-05-02: qty 75

## 2017-05-03 ENCOUNTER — Encounter: Payer: Self-pay | Admitting: Oncology

## 2017-05-05 ENCOUNTER — Ambulatory Visit (INDEPENDENT_AMBULATORY_CARE_PROVIDER_SITE_OTHER): Payer: Medicare Other | Admitting: Family Medicine

## 2017-05-05 ENCOUNTER — Encounter: Payer: Self-pay | Admitting: Family Medicine

## 2017-05-05 VITALS — BP 144/68 | HR 83 | Temp 97.8°F

## 2017-05-05 DIAGNOSIS — D6859 Other primary thrombophilia: Secondary | ICD-10-CM | POA: Diagnosis not present

## 2017-05-05 DIAGNOSIS — E038 Other specified hypothyroidism: Secondary | ICD-10-CM | POA: Diagnosis not present

## 2017-05-05 DIAGNOSIS — E213 Hyperparathyroidism, unspecified: Secondary | ICD-10-CM | POA: Diagnosis not present

## 2017-05-05 DIAGNOSIS — E039 Hypothyroidism, unspecified: Secondary | ICD-10-CM | POA: Diagnosis not present

## 2017-05-05 DIAGNOSIS — E559 Vitamin D deficiency, unspecified: Secondary | ICD-10-CM | POA: Diagnosis not present

## 2017-05-05 DIAGNOSIS — M84559P Pathological fracture in neoplastic disease, hip, unspecified, subsequent encounter for fracture with malunion: Secondary | ICD-10-CM

## 2017-05-05 DIAGNOSIS — R2230 Localized swelling, mass and lump, unspecified upper limb: Secondary | ICD-10-CM

## 2017-05-05 MED ORDER — HYDROCODONE-ACETAMINOPHEN 5-325 MG PO TABS: 1.0000 | ORAL_TABLET | Freq: Four times a day (QID) | ORAL | 0 refills | Status: DC | PRN

## 2017-05-05 MED ORDER — LEVOTHYROXINE SODIUM 125 MCG PO TABS: 125.0000 ug | ORAL_TABLET | Freq: Every day | ORAL | 3 refills | Status: DC

## 2017-05-05 NOTE — Progress Notes (Addendum)
Alicia Vasquez is a 76 y.o. female is here for follow up.  History of Present Illness:   HPI: See Assessment and Plan section for Problem Based Charting of issues discussed today.  Recommendations for Outpatient Follow-up after surgery (August 2018):  1. Follow up with PCP in 1 week 2. Please obtain BMP/CBC in one week to monitor Hgb and Renal function  3. Please obtain daily INR's and adjust warfarin to INR goal of 2-3  4. Please follow up with oncology Dr. Jana Hakim  5. Posterior hip precautions and PWB  6. Follow up with Dr Alvan Dame (Orthopedic) in 2 weeks  7. Dressing to remain in place until orthopedic follow up  8. BP medications on hold as, BP has been stable off medications   Health Maintenance Due  Topic Date Due  . TETANUS/TDAP  07/14/1959  . PNA vac Low Risk Adult (2 of 2 - PCV13) 07/20/2010  . INFLUENZA VACCINE  12/28/2016   Depression screen [facility] 2/9 04/26/2017 04/12/2017 12/05/2016  Decreased Interest 0 0 0  Down, Depressed, Hopeless 1 1 0  PHQ - 2 Score 1 1 0  Some recent data might be hidden   PMHx, SurgHx, SocialHx, FamHx, Medications, and Allergies were reviewed in the Visit Navigator and updated as appropriate.   Patient Active Problem List   Diagnosis Date Noted  . Counseling regarding goals of care 04/19/2017  . Peri-prosthetic fracture of shaft of femur 01/26/2017  . Pain from bone metastases ([place]) 01/25/2017  . Bone metastases ([place]) 01/25/2017  . Acute lower UTI 01/16/2017  . Endometrial hyperplasia 01/16/2017  . Lytic bone lesion of left femur 01/15/2017  . Hip fracture, pathological ([place]) 01/15/2017  . Lymphedema 09/17/2015  . Long term current use of anticoagulant therapy 08/23/2015  . DVT, lower extremity ([place]) 06/18/2015  . Bilateral knee pain 04/03/2015  . Arm edema 08/28/2014  . Vitamin D deficiency 04/23/2014  . Hyperparathyroidism ([place]) 04/23/2014  . Depression 07/18/2013  . Malignant neoplasm of upper-outer quadrant of right breast in  female, estrogen receptor positive ([place]) 07/15/2013  . Pre-op examination 07/05/2013  . Overactive bladder 01/30/2013  . Primary hypercoagulable state ([place]) 12/19/2012  . HTN (hypertension) 09/27/2011  . Hearing loss 09/27/2011  . Seasonal allergies 09/27/2011  . Shortness of breath 08/26/2011  . Hypothyroid 08/26/2011   Social History   Tobacco Use  . Smoking status: Never Smoker  . Smokeless tobacco: Never Used  Substance Use Topics  . Alcohol use: No    Alcohol/week: 0.0 oz  . Drug use: No   Current Medications and Allergies:   .  Cholecalciferol (VITAMIN D3) 5000 UNITS TABS, Take 5,000 Units by mouth daily. , Disp: , Rfl:  .  docusate sodium (COLACE) 100 MG capsule, Take 1 capsule (100 mg total) by mouth 2 (two) times daily. (Patient not taking: Reported on 04/18/2017), Disp: 10 capsule, Rfl: 0 .  ferrous sulfate 325 (65 FE) MG tablet, Take 1 tablet (325 mg total) by mouth 3 (three) times daily after meals. (Patient not taking: Reported on 04/18/2017), Disp: 30 tablet, Rfl: 0 .  HYDROcodone-acetaminophen (NORCO/VICODIN) 5-325 MG tablet, Take 1 tablet by mouth every 8 (eight) hours as needed for moderate pain., Disp: 120 tablet, Rfl: 0 .  ketoconazole (NIZORAL) 2 % cream, Apply 1 application topically daily., Disp: 15 g, Rfl: 0 .  levothyroxine (SYNTHROID) 125 MCG tablet, Take 1 tablet (125 mcg total) by mouth daily before breakfast. Dispense as written: Synthroid LAST REFILL, Disp: 90 tablet, Rfl: 3 .  methocarbamol (ROBAXIN) 500 MG tablet, Take 1 tablet (500 mg total) by mouth every 6 (six) hours as needed for muscle spasms., Disp: 40 tablet, Rfl: 0 .  polyethylene glycol (MIRALAX / GLYCOLAX) packet, Take 17 g by mouth 2 (two) times daily., Disp: 14 each, Rfl: 0 .  warfarin (JANTOVEN) 5 MG tablet, Take 1 tablet (5 mg total) by mouth daily at 6 PM., Disp: 30 tablet, Rfl: 1  Allergies  Allergen Reactions  . Anesthetics, Amide Hypertension  . Benadryl [Diphenhydramine Hcl] Other  (See Comments)    Dizziness  . Carbocaine [Mepivacaine Hcl] Hypertension  . Codeine Other (See Comments)    Dizziness  . Epinephrine Hypertension  . Sulfa Antibiotics Other (See Comments)    dizziness  . Latex Other (See Comments)    Blisters in mouth  . Tramadol     Sedation.   Marland Kitchen Penicillins Rash    Has patient had a PCN reaction causing immediate rash, facial/tongue/throat swelling, SOB or lightheadedness with hypotension: Unknown Has patient had a PCN reaction causing severe rash involving mucus membranes or skin necrosis: Unknown Has patient had a PCN reaction that required hospitalization: Unknown Has patient had a PCN reaction occurring within the last 10 years: Unknown If all of the above answers are "NO", then may proceed with Cephalosporin use.    Review of Systems   Pertinent items are noted in the HPI. Otherwise, ROS is negative.  Vitals:   Vitals:   05/05/17 1416  BP: (!) 144/68  Pulse: 83  Temp: 97.8 F (36.6 C)  TempSrc: Oral  SpO2: 97%     There is no height or weight on file to calculate BMI.   Physical Exam:   Physical Exam  Constitutional: She appears well-nourished.  HENT:  Head: Normocephalic and atraumatic.  Eyes: EOM are normal. Pupils are equal, round, and reactive to light.  Neck: Normal range of motion. Neck supple.  Cardiovascular: Normal rate, regular rhythm, normal heart sounds and intact distal pulses.  Pulmonary/Chest: Effort normal.  Abdominal: Soft.  Musculoskeletal:       Left hip: She exhibits decreased range of motion and tenderness. She exhibits no swelling.       Legs: Skin: Skin is warm.  Psychiatric: She has a normal mood and affect. Her behavior is normal.  Nursing note and vitals reviewed.   Results for orders placed or performed in visit on 05/05/17  Comprehensive metabolic panel  Result Value Ref Range   Glucose, Bld 111 (H) 65 - 99 mg/dL   BUN 15 7 - 25 mg/dL   Creat 0.74 0.60 - 0.93 mg/dL   BUN/Creatinine Ratio  NOT APPLICABLE 6 - 22 (calc)   Sodium 139 135 - 146 mmol/L   Potassium 4.4 3.5 - 5.3 mmol/L   Chloride 104 98 - 110 mmol/L   CO2 27 20 - 32 mmol/L   Calcium 10.7 (H) 8.6 - 10.4 mg/dL   Total Protein 6.0 (L) 6.1 - 8.1 g/dL   Albumin 3.6 3.6 - 5.1 g/dL   Globulin 2.4 1.9 - 3.7 g/dL (calc)   AG Ratio 1.5 1.0 - 2.5 (calc)   Total Bilirubin 0.3 0.2 - 1.2 mg/dL   Alkaline phosphatase (APISO) 110 33 - 130 U/L   AST 11 10 - 35 U/L   ALT 9 6 - 29 U/L  CBC with Differential/Platelet  Result Value Ref Range   WBC 7.5 3.8 - 10.8 Thousand/uL   RBC 4.28 3.80 - 5.10 Million/uL   Hemoglobin 10.8 (L)  11.7 - 15.5 g/dL   HCT 34.5 (L) 35.0 - 45.0 %   MCV 80.6 80.0 - 100.0 fL   MCH 25.2 (L) 27.0 - 33.0 pg   MCHC 31.3 (L) 32.0 - 36.0 g/dL   RDW 14.2 11.0 - 15.0 %   Platelets 346 140 - 400 Thousand/uL   MPV 10.2 7.5 - 12.5 fL   Neutro Abs 4,920 1,500 - 7,800 cells/uL   Lymphs Abs 1,215 850 - 3,900 cells/uL   WBC mixed population 1,125 (H) 200 - 950 cells/uL   Eosinophils Absolute 173 15 - 500 cells/uL   Basophils Absolute 68 0 - 200 cells/uL   Neutrophils Relative % 65.6 %   Total Lymphocyte 16.2 %   Monocytes Relative 15.0 %   Eosinophils Relative 2.3 %   Basophils Relative 0.9 %  VITAMIN D 25 Hydroxy (Vit-D Deficiency, Fractures)  Result Value Ref Range   Vit D, 25-Hydroxy 63 30 - 100 ng/mL  TSH  Result Value Ref Range   TSH 3.14 0.40 - 4.50 mIU/L  T4, free  Result Value Ref Range   Free T4 1.3 0.8 - 1.8 ng/dL  Uric acid  Result Value Ref Range   Uric Acid, Serum 5.1 2.5 - 7.0 mg/dL   Assessment and Plan:   Holly was seen today for follow-up. She is here with her friends Curt Bears and [name].  She lives with Curt Bears currently.  She has undergone two hip replacements, the last one being in September.  She was subsequently went to rehab.  She was discharged from rehab and moved in with Curt Bears in October.  She has continued to work with PT and will be discharged tomorrow from their service.     Diagnoses and all orders for this visit:  Vitamin D deficiency Comments: Recheck today.  Orders: -     VITAMIN D 25 Hydroxy (Vit-D Deficiency, Fractures)  Acquired hypothyroidism Comments: Recheck today and WNL. Endocrine ROS: negative for - breast changes, mood swings, palpitations, polydipsia/polyuria, temperature intolerance or unexpected weight changes.  Lab Results  Component Value Date   TSH 3.14 05/05/2017   FREET4 1.3 05/05/2017   Orders: -     levothyroxine (SYNTHROID) 125 MCG tablet; Take 1 tablet (125 mcg total) by mouth daily before breakfast.  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 Pain is uncontrolled. Patient states that Orthopedics will no longer fill her medication. She waits for the pain to be unbearable before taking Norco. We reviewed the importance of "staying ahead of the pain." New Rx today and I am okay with prescribing pain medication. After the patient left, I reviewed her last Oncology note - Norco offered through them. Will coordinate to see what would be easiest for the Team.  Orders: -     CBC with Differential/Platelet -     Uric acid -     HYDROcodone-acetaminophen (NORCO/VICODIN) 5-325 MG tablet; Take 1 tablet by mouth every 6 (six) hours as needed for moderate pain.  Hyperparathyroidism [facility]) Comments:   Per Endocrinology's note (12/06/2016): Patient has had mostly normal calcium levels in the past, however, she has had some slightly higher values since last visit, with the highest being yesterday, 11.1. Initially, an intact PTH level was also high, but normalized after her vitamin D also normalized on 5000 units daily. She continues on  this dose. She does not have a history of nephrolithiasis, no osteoporosis (however her bone density has worsened in the last 2 years). Of  note, a 33% radial BMD was normal and higher than the rest of the scores. This is the site most affected by hyperparathyroidism. In the past, we decided to only follow her normal/slightly elevated calcium, if she starts trending up, we do need to intervene.  Lab Results  Component Value Date   PTH 26 12/06/2016   PTH Comment 12/06/2016   CALCIUM 10.7 (H) 05/05/2017   CALCIUM 10.8 (H) 05/05/2017   PHOS 2.8 04/18/2014   Orders: -     Comprehensive metabolic panel -     PTH, Intact and Calcium  Nodule of finger, unspecified laterality Comments: Nodule. Benign-appearing. Non-tender at this time. No trauma.   Records requested if needed. Time spent with the patient: 45 minutes, of which >50% was spent in obtaining information about her symptoms, reviewing her previous labs, evaluations, and treatments, counseling her about her condition (please see the discussed topics above), and developing a plan to further investigate it; she had a number of questions which I addressed.   . Reviewed expectations re: course of current medical issues. . Discussed self-management of symptoms. . Outlined signs and symptoms indicating need for more acute intervention. . Patient verbalized understanding and all questions were answered. Marland Kitchen Health Maintenance issues including appropriate healthy diet, exercise, and smoking avoidance were discussed with patient. . See orders for this visit as documented in the electronic medical record. . Patient received an After Visit Summary.  Briscoe Deutscher, DO [place] [place] [facility] 05/09/2017  Future Appointments  Date Time Provider [place]  05/10/2017  2:30 PM Francis Gaines, LCSW [facility] None  05/16/2017  2:00 PM [facility] LAB 1 [facility] None  05/16/2017  2:30 PM Gardenia Phlegm, NP [facility] None  05/16/2017  3:00 PM [facility] FLUSH NURSE [facility] None  06/08/2017  3:00 PM Philemon Kingdom, MD [facility] None  06/13/2017  3:15  PM [facility] LAB 2 [facility] None  06/13/2017  3:45 PM Magrinat, [name], MD [facility] None  06/13/2017  4:15 PM [facility] FLUSH NURSE 2 [facility] None  06/15/2017  2:00 PM Williemae Area, RN [facility]

## 2017-05-06 LAB — COMPREHENSIVE METABOLIC PANEL
AG Ratio: 1.5 (calc) (ref 1.0–2.5)
ALT: 9 U/L (ref 6–29)
AST: 11 U/L (ref 10–35)
Albumin: 3.6 g/dL (ref 3.6–5.1)
Alkaline phosphatase (APISO): 110 U/L (ref 33–130)
BUN: 15 mg/dL (ref 7–25)
CO2: 27 mmol/L (ref 20–32)
Calcium: 10.7 mg/dL — ABNORMAL HIGH (ref 8.6–10.4)
Chloride: 104 mmol/L (ref 98–110)
Creat: 0.74 mg/dL (ref 0.60–0.93)
Globulin: 2.4 g/dL (calc) (ref 1.9–3.7)
Glucose, Bld: 111 mg/dL — ABNORMAL HIGH (ref 65–99)
Potassium: 4.4 mmol/L (ref 3.5–5.3)
Sodium: 139 mmol/L (ref 135–146)
Total Bilirubin: 0.3 mg/dL (ref 0.2–1.2)
Total Protein: 6 g/dL — ABNORMAL LOW (ref 6.1–8.1)

## 2017-05-06 LAB — TSH: TSH: 3.14 mIU/L (ref 0.40–4.50)

## 2017-05-06 LAB — CBC WITH DIFFERENTIAL/PLATELET
Basophils Absolute: 68 cells/uL (ref 0–200)
Basophils Relative: 0.9 %
Eosinophils Absolute: 173 cells/uL (ref 15–500)
Eosinophils Relative: 2.3 %
HCT: 34.5 % — ABNORMAL LOW (ref 35.0–45.0)
Hemoglobin: 10.8 g/dL — ABNORMAL LOW (ref 11.7–15.5)
Lymphs Abs: 1215 cells/uL (ref 850–3900)
MCH: 25.2 pg — ABNORMAL LOW (ref 27.0–33.0)
MCHC: 31.3 g/dL — ABNORMAL LOW (ref 32.0–36.0)
MCV: 80.6 fL (ref 80.0–100.0)
MPV: 10.2 fL (ref 7.5–12.5)
Monocytes Relative: 15 %
Neutro Abs: 4920 cells/uL (ref 1500–7800)
Neutrophils Relative %: 65.6 %
Platelets: 346 10*3/uL (ref 140–400)
RBC: 4.28 10*6/uL (ref 3.80–5.10)
RDW: 14.2 % (ref 11.0–15.0)
Total Lymphocyte: 16.2 %
WBC mixed population: 1125 cells/uL — ABNORMAL HIGH (ref 200–950)
WBC: 7.5 10*3/uL (ref 3.8–10.8)

## 2017-05-06 LAB — URIC ACID: Uric Acid, Serum: 5.1 mg/dL (ref 2.5–7.0)

## 2017-05-06 LAB — T4, FREE: Free T4: 1.3 ng/dL (ref 0.8–1.8)

## 2017-05-06 LAB — VITAMIN D 25 HYDROXY (VIT D DEFICIENCY, FRACTURES): Vit D, 25-Hydroxy: 63 ng/mL (ref 30–100)

## 2017-05-08 ENCOUNTER — Encounter: Payer: Self-pay | Admitting: *Deleted

## 2017-05-09 LAB — PTH, INTACT AND CALCIUM
Calcium: 10.8 mg/dL — ABNORMAL HIGH (ref 8.6–10.4)
PTH: 46 pg/mL (ref 14–64)

## 2017-05-10 ENCOUNTER — Other Ambulatory Visit: Payer: Self-pay | Admitting: *Deleted

## 2017-05-10 NOTE — Patient Outreach (Signed)
[facility]) Care Management  05/10/2017  [place] [birth year] [id number]   CSW made an attempt to try and contact patient today to follow-up regarding social work services and resources, as well as to ensure that patient received the packet of resource information mailed to her home by CSW; however, patient was unavailable at the time of CSW's call.  A HIPAA compliant message was left for patient on voicemail.  CSW is currently awaiting a return call.  CSW will make a second outreach attempt within the next week, if CSW does not receive a return call from patient in the meantime. Nat Christen, BSW, MSW, LCSW  Licensed Education officer, [facility]  [place] [place] [address] [place], [place] [address] Physical Address-[address], [place], [place] [address] Toll Free Main # [phone] Fax # [phone] Cell # [phone]  Office # [phone] [email or web address]

## 2017-05-15 ENCOUNTER — Telehealth: Payer: Self-pay | Admitting: Family Medicine

## 2017-05-15 ENCOUNTER — Other Ambulatory Visit: Payer: Self-pay | Admitting: *Deleted

## 2017-05-15 NOTE — Telephone Encounter (Signed)
Copied from [place] [phone]. Topic: Quick Communication - See Telephone Encounter >> May 15, 2017  1:36 PM Synthia Innocent wrote: CRM for notification. See Telephone encounter for: Patients address was listed wrong on prescriptions, correct address has been changed in our system. She is afraid pharmacy will not fill. Requesting [name] to call pharmacy [facility] [address].   Does she need a prescription for TED hoses?  05/15/17.

## 2017-05-15 NOTE — Patient Outreach (Signed)
[facility]) Care Management  05/15/2017  Alicia Vasquez Oct 23, 1940 [id number]   CSW made a second attempt to try and contact patient today to perform phone assessment, as well as assess and assist with social needs and services, without success.  A HIPAA compliant message was left for patient on voicemail.  CSW is currently awaiting a return call. CSW will make a third and final outreach attempt within the next week, if CSW does not receive a return call from patient in the meantime. Nat Christen, BSW, MSW, LCSW  Licensed Education officer, environmental [facility]  [place] [address] [place], [place] [address] Physical Address-[address], [place], [place] [address] Toll Free Main # [phone] Fax # [phone] Cell # [phone]  Office # [phone] [email or web address]

## 2017-05-16 ENCOUNTER — Ambulatory Visit (HOSPITAL_BASED_OUTPATIENT_CLINIC_OR_DEPARTMENT_OTHER): Payer: Medicare Other

## 2017-05-16 ENCOUNTER — Encounter: Payer: Self-pay | Admitting: Adult Health

## 2017-05-16 ENCOUNTER — Ambulatory Visit (HOSPITAL_BASED_OUTPATIENT_CLINIC_OR_DEPARTMENT_OTHER): Payer: Medicare Other | Admitting: Adult Health

## 2017-05-16 ENCOUNTER — Other Ambulatory Visit (HOSPITAL_BASED_OUTPATIENT_CLINIC_OR_DEPARTMENT_OTHER): Payer: Medicare Other

## 2017-05-16 ENCOUNTER — Telehealth: Payer: Self-pay

## 2017-05-16 VITALS — BP 160/58 | HR 90 | Temp 97.7°F | Resp 16 | Ht 64.0 in

## 2017-05-16 DIAGNOSIS — Z86711 Personal history of pulmonary embolism: Secondary | ICD-10-CM | POA: Diagnosis not present

## 2017-05-16 DIAGNOSIS — I82403 Acute embolism and thrombosis of unspecified deep veins of lower extremity, bilateral: Secondary | ICD-10-CM

## 2017-05-16 DIAGNOSIS — M84659P Pathological fracture in other disease, hip, unspecified, subsequent encounter for fracture with malunion: Secondary | ICD-10-CM

## 2017-05-16 DIAGNOSIS — C7951 Secondary malignant neoplasm of bone: Secondary | ICD-10-CM | POA: Diagnosis not present

## 2017-05-16 DIAGNOSIS — M84559P Pathological fracture in neoplastic disease, hip, unspecified, subsequent encounter for fracture with malunion: Secondary | ICD-10-CM

## 2017-05-16 DIAGNOSIS — C50411 Malignant neoplasm of upper-outer quadrant of right female breast: Secondary | ICD-10-CM

## 2017-05-16 DIAGNOSIS — Z170 Estrogen receptor positive status [ER+]: Secondary | ICD-10-CM

## 2017-05-16 DIAGNOSIS — Z5111 Encounter for antineoplastic chemotherapy: Secondary | ICD-10-CM

## 2017-05-16 DIAGNOSIS — M8580 Other specified disorders of bone density and structure, unspecified site: Secondary | ICD-10-CM

## 2017-05-16 DIAGNOSIS — M899 Disorder of bone, unspecified: Secondary | ICD-10-CM

## 2017-05-16 LAB — COMPREHENSIVE METABOLIC PANEL
ALBUMIN: 3.6 g/dL (ref 3.5–5.0)
ALK PHOS: 130 U/L (ref 40–150)
ALT: <6 U/L (ref 0–55)
ANION GAP: 8 meq/L (ref 3–11)
AST: 13 U/L (ref 5–34)
BILIRUBIN TOTAL: 0.31 mg/dL (ref 0.20–1.20)
BUN: 13.2 mg/dL (ref 7.0–26.0)
CALCIUM: 11 mg/dL — AB (ref 8.4–10.4)
CO2: 27 mEq/L (ref 22–29)
Chloride: 105 mEq/L (ref 98–109)
Creatinine: 0.8 mg/dL (ref 0.6–1.1)
Glucose: 140 mg/dl (ref 70–140)
POTASSIUM: 3.8 meq/L (ref 3.5–5.1)
SODIUM: 141 meq/L (ref 136–145)
TOTAL PROTEIN: 6.9 g/dL (ref 6.4–8.3)

## 2017-05-16 LAB — CBC WITH DIFFERENTIAL/PLATELET
BASO%: 1.4 % (ref 0.0–2.0)
Basophils Absolute: 0.1 10*3/uL (ref 0.0–0.1)
EOS%: 2.9 % (ref 0.0–7.0)
Eosinophils Absolute: 0.2 10*3/uL (ref 0.0–0.5)
HCT: 36.7 % (ref 34.8–46.6)
HGB: 11.6 g/dL (ref 11.6–15.9)
LYMPH%: 15.5 % (ref 14.0–49.7)
MCH: 25.6 pg (ref 25.1–34.0)
MCHC: 31.7 g/dL (ref 31.5–36.0)
MCV: 80.8 fL (ref 79.5–101.0)
MONO#: 0.8 10*3/uL (ref 0.1–0.9)
MONO%: 11.1 % (ref 0.0–14.0)
NEUT%: 69.1 % (ref 38.4–76.8)
NEUTROS ABS: 4.8 10*3/uL (ref 1.5–6.5)
PLATELETS: 323 10*3/uL (ref 145–400)
RBC: 4.54 10*6/uL (ref 3.70–5.45)
RDW: 16.8 % — ABNORMAL HIGH (ref 11.2–14.5)
WBC: 7 10*3/uL (ref 3.9–10.3)
lymph#: 1.1 10*3/uL (ref 0.9–3.3)

## 2017-05-16 LAB — PROTIME-INR
INR: 1.3 — AB (ref 2.00–3.50)
PROTIME: 15.6 s — AB (ref 10.6–13.4)

## 2017-05-16 MED ORDER — HYDROCODONE-ACETAMINOPHEN 5-325 MG PO TABS: 1.0000 | ORAL_TABLET | Freq: Four times a day (QID) | ORAL | 0 refills | Status: DC | PRN

## 2017-05-16 MED ORDER — DENOSUMAB 120 MG/1.7ML ~~LOC~~ SOLN
SUBCUTANEOUS | Status: AC
  Filled 2017-05-16: qty 1.7

## 2017-05-16 MED ORDER — FULVESTRANT 250 MG/5ML IM SOLN
500.0000 mg | Freq: Once | INTRAMUSCULAR | Status: AC
  Administered 2017-05-16: 500 mg via INTRAMUSCULAR

## 2017-05-16 MED ORDER — DENOSUMAB 120 MG/1.7ML ~~LOC~~ SOLN
120.0000 mg | Freq: Once | SUBCUTANEOUS | Status: AC
  Administered 2017-05-16: 120 mg via SUBCUTANEOUS

## 2017-05-16 MED ORDER — FULVESTRANT 250 MG/5ML IM SOLN
INTRAMUSCULAR | Status: AC
  Filled 2017-05-16: qty 5

## 2017-05-16 MED ORDER — WARFARIN SODIUM 5 MG PO TABS: 5.0000 mg | ORAL_TABLET | Freq: Every day | ORAL | 4 refills | Status: DC

## 2017-05-16 NOTE — Telephone Encounter (Signed)
Called pt at number provided - no answer, no vm.  Called emergency contact number for pt and lvm for pt to return call.

## 2017-05-16 NOTE — Telephone Encounter (Signed)
I have explained to the patient that the address has been changed in the system and since she isn't having the medications delivered it should not affect her getting the medications. Patient was fine with this. She will call back if she has any problems.

## 2017-05-16 NOTE — Telephone Encounter (Signed)
Please advise on the medication request below and resend to pharmacy if needed.

## 2017-05-16 NOTE — Progress Notes (Signed)
[facility]  Telephone:([phone]) [phone] Fax:(336) [phone]     ID: Anahit Klumb Kist OB: [birth year]  MR#: [id number]  KVQ#:[id number]  PCP: Briscoe Deutscher, DO GYN:   SU: [name] OTHER MD: Thea Silversmith, Hart Robinsons, Philemon Kingdom  CHIEF COMPLAINT: Estrogen receptor positive breast cancer  CURRENT TREATMENT:  Fulvestrant and Delton See   INTERVAL HISTORY: Alicia Vasquez is here today for evaluation of her metastatic breast cancer.  She was recently started on Fulvestrant and Xgeva.  She also recently underwent scans with bone scan and CT chest.  Hanadi tolerated the Fulvestrant and Xgeva well.  She did have some injection site pain and soreness, but that has since resolved.  REVIEW OF SYSTEMS: Alicia Vasquez's left leg is numb and painful.  It has been this way since her surgery.  She has difficulty with moving it sometimes, but otherwise is doing well.  She underwent a bone scan and Ct chest and is requesting results of these studies today.  She is taking Norco for her pain and it is managing her pain well.  She denies any constipation from the medication.  She continues to live and have support from her close friends who accompanied her to today's appointment.  Otherwise, a detailed ROS is non contributory today.    BREAST CANCER HISTORY: From doctor [name]'s intake node 07/24/2013:  "76 y.o. female. Who presented with SOB and had a CT chest performed that revealed a right breast mass. Mammogram/ultrsound on 2/13 showed a mass in the 11 o'clock position in the right breast measuring 1.5 cm. Also noted was a right axillary LN measuring 1.6 cm. MRI not performed. Biopsy of mass and lymph done. 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 Lymph node + for metastatic carcinoma."  [On 09/09/2013 the patient underwent right lumpectomy and sentinel lymph node sampling. This showed  (SZA [id number]) multifocal invasive ductal carcinoma, grade 1, the largest lesion measuring 1.8 cm, the second lesion 1.2 cm. One of 4 sentinel lymph nodes was positive, with extracapsular extension. Margins were positive. HER-2 was repeated and was again negative. Further surgery 09/16/2013 obtained clear margins.  Her subsequent history is as detailed below  PAST MEDICAL HISTORY: Past Medical History:  Diagnosis Date  . Allergy   . Anxiety   . Arthritis   . Blood transfusion without reported diagnosis   . Breast cancer ([place]) 07/12/2013   Invasive Mammary Carcinoma  . DVT (deep vein thrombosis) in pregnancy ([place])   . Hypertension   . Hypothyroid   . Personal history of radiation therapy   . Pneumonia   . Radiation 11/21/13-01/07/14   Right Breast/Supraclavicular    PAST SURGICAL HISTORY: Past Surgical History:  Procedure Laterality Date  . BREAST LUMPECTOMY WITH RADIOACTIVE SEED LOCALIZATION Right 09/09/2013   Procedure: BREAST LUMPECTOMY WITH RADIOACTIVE SEED LOCALIZATION WITH AXILLARY NODE EXCISION;  Surgeon: [name], MD;  Location: [facility];  Service: General;  Laterality: Right;  . DENTAL SURGERY  04/19/2012   13 TEETH REMOVED  . DILATION AND CURETTAGE OF UTERUS    . ORIF PERIPROSTHETIC FRACTURE Left 01/31/2017   Procedure: REVISION and OPEN REDUCTION INTERNAL FIXATION (ORIF) PERIPROSTHETIC FRACTURE LEFT HIP;  Surgeon: Paralee Cancel, MD;  Location: [facility];  Service: Orthopedics;  Laterality: Left;  120 mins  . RE-EXCISION OF BREAST LUMPECTOMY Right 09/24/2013   Procedure: RE-EXCISION OF RIGHT BREAST LUMPECTOMY;  Surgeon: [name], MD;  Location:  [facility];  Service: General;  Laterality: Right;  . TOTAL HIP ARTHROPLASTY Left 01/16/2017   Procedure: TOTAL HIP ARTHROPLASTY POSTERIOR;  Surgeon: Paralee Cancel, MD;  Location: [facility];  Service: Orthopedics;  Laterality: Left;    FAMILY HISTORY Family History  Problem Relation Age of  Onset  . Heart disease Brother   . Colon cancer Brother   . Prostate cancer Brother    the patient's father died at the age of 75 after an automobile accident. The patient's mother died at the age of 61. She was a Marine scientist here in [name] in the old [facility]. She was infected with polio and was confined to a wheelchair for a good part of her life. She eventually died of pneumonia. The patient had one brother, who died with prostate cancer. She had no sisters. There is no history of breast or ovarian cancer in the family.  GYNECOLOGIC HISTORY:  Menarche age 29, first live birth age 46, the patient is GX P1. She went through the change of life at age 76. 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 She is widowed. Currently she is staying with her friend Alicia Vasquez,  25,who is a retired Radio producer. The patient's son Alicia Vasquez lives in [place]. He works an Engineer, technical sales. The patient has no grandchildren. She is a Tourist information centre manager but currently attends a [employer] with her friend [name]   ADVANCED DIRECTIVES: Not in place   HEALTH MAINTENANCE: Social History   Tobacco Use  . Smoking status: Never Smoker  . Smokeless tobacco: Never Used  Substance Use Topics  . Alcohol use: No    Alcohol/week: 0.0 oz  . Drug use: No     Colonoscopy: Never  PAP:  Bone density: 10/01/2009; lowest T score -0.8  Lipid panel:  Allergies  Allergen Reactions  . Anesthetics, [place]ide Hypertension  . Benadryl [Diphenhydramine Hcl] Other (See Comments)    Dizziness  . Carbocaine [Mepivacaine Hcl] Hypertension  . Codeine Other (See Comments)    Dizziness  . Epinephrine Hypertension  . Sulfa Antibiotics Other (See Comments)    dizziness  . Latex Other (See Comments)    Blisters in mouth  . Tramadol     Sedation.   Marland Kitchen Penicillins Rash    Has patient had a PCN reaction causing immediate rash, facial/tongue/throat swelling, SOB  or lightheadedness with hypotension: Unknown Has patient had a PCN reaction causing severe rash involving mucus membranes or skin necrosis: Unknown Has patient had a PCN reaction that required hospitalization: Unknown Has patient had a PCN reaction occurring within the last 10 years: Unknown If all of the above answers are "NO", then may proceed with Cephalosporin use.     Current Outpatient Medications  Medication Sig Dispense Refill  . Cholecalciferol (VITAMIN D3) 5000 UNITS TABS Take 5,000 Units by mouth daily.     Marland Kitchen HYDROcodone-acetaminophen (NORCO/VICODIN) 5-325 MG tablet Take 1 tablet by mouth every 6 (six) hours as needed for moderate pain. 120 tablet 0  . ketoconazole (NIZORAL) 2 % cream Apply 1 application topically daily. 15 g 0  . levothyroxine (SYNTHROID) 125 MCG tablet Take 1 tablet (125 mcg total) by mouth daily before breakfast. Dispense as written: Synthroid LAST REFILL 90 tablet 3  . methocarbamol (ROBAXIN) 500 MG tablet Take 1 tablet (500 mg total) by mouth every 6 (six) hours as needed for muscle spasms. 40 tablet 0  . polyethylene glycol (MIRALAX /  GLYCOLAX) packet Take 17 g by mouth 2 (two) times daily. 14 each 0  . warfarin (JANTOVEN) 5 MG tablet Take 1 tablet (5 mg total) by mouth daily at 6 PM. 30 tablet 1   No current facility-administered medications for this visit.     OBJECTIVE:   Vitals:   05/16/17 1432  BP: (!) 160/58  Pulse: 90  Resp: 16  Temp: 97.7 F (36.5 C)  SpO2: 100%     Body mass index is 35.19 kg/m.    ECOG FS:3 GENERAL: Patient is a chronically ill appearing woman sitting in wheelchair HEENT:  Sclerae anicteric.  Oropharynx clear and moist. No ulcerations or evidence of oropharyngeal candidiasis. Neck is supple.  NODES:  No cervical, supraclavicular, or axillary lymphadenopathy palpated.  BREAST EXAM:  Deferred. LUNGS:  Clear to auscultation bilaterally.  No wheezes or rhonchi. HEART:  Regular rate and rhythm. No murmur  appreciated. ABDOMEN:  Soft, nontender.  Positive, normoactive bowel sounds. No organomegaly palpated. MSK:  No focal spinal tenderness to palpation. Marland Kitchen EXTREMITIES:  No peripheral edema.   SKIN:  Clear with no obvious rashes or skin changes. No nail dyscrasia. NEURO:  Nonfocal. Well oriented.  Appropriate affect.    LAB RESULTS:  CMP     Component Value Date/Time   NA 139 05/05/2017 1456   NA 140 04/18/2017 1401   K 4.4 05/05/2017 1456   K 4.6 04/18/2017 1401   CL 104 05/05/2017 1456   CO2 27 05/05/2017 1456   CO2 28 04/18/2017 1401   GLUCOSE 111 (H) 05/05/2017 1456   GLUCOSE 112 04/18/2017 1401   BUN 15 05/05/2017 1456   BUN 16.9 04/18/2017 1401   CREATININE 0.74 05/05/2017 1456   CREATININE 0.9 04/18/2017 1401   CALCIUM 10.7 (H) 05/05/2017 1456   CALCIUM 10.8 (H) 05/05/2017 1456   CALCIUM 11.5 (H) 04/18/2017 1401   PROT 6.0 (L) 05/05/2017 1456   PROT 6.9 04/18/2017 1401   ALBUMIN 3.3 (L) 04/18/2017 1401   AST 11 05/05/2017 1456   AST 9 04/18/2017 1401   ALT 9 05/05/2017 1456   ALT 9 04/18/2017 1401   ALKPHOS 117 04/18/2017 1401   BILITOT 0.3 05/05/2017 1456   BILITOT 0.22 04/18/2017 1401   GFRNONAA >60 02/02/2017 0545   GFRNONAA 75 08/19/2015 1602   GFRAA >60 02/02/2017 0545   GFRAA 87 08/19/2015 1602    I No results found for: SPEP  Lab Results  Component Value Date   WBC 7.0 05/16/2017   NEUTROABS 4.8 05/16/2017   HGB 11.6 05/16/2017   HCT 36.7 05/16/2017   MCV 80.8 05/16/2017   PLT 323 05/16/2017      Chemistry      Component Value Date/Time   NA 139 05/05/2017 1456   NA 140 04/18/2017 1401   K 4.4 05/05/2017 1456   K 4.6 04/18/2017 1401   CL 104 05/05/2017 1456   CO2 27 05/05/2017 1456   CO2 28 04/18/2017 1401   BUN 15 05/05/2017 1456   BUN 16.9 04/18/2017 1401   CREATININE 0.74 05/05/2017 1456   CREATININE 0.9 04/18/2017 1401      Component Value Date/Time   CALCIUM 10.7 (H) 05/05/2017 1456   CALCIUM 10.8 (H) 05/05/2017 1456    CALCIUM 11.5 (H) 04/18/2017 1401   ALKPHOS 117 04/18/2017 1401   AST 11 05/05/2017 1456   AST 9 04/18/2017 1401   ALT 9 05/05/2017 1456   ALT 9 04/18/2017 1401   BILITOT 0.3 05/05/2017 1456  BILITOT 0.22 04/18/2017 1401       No results found for: LABCA2  No components found for: LABCA125  Recent Labs  Lab 05/16/17 1417  INR 1.30*    Urinalysis    Component Value Date/Time   COLORURINE STRAW (A) 01/15/2017 2000   APPEARANCEUR CLEAR 01/15/2017 2000   LABSPEC 1.008 01/15/2017 2000   LABSPEC 1.010 09/22/2016 1553   PHURINE 6.0 01/15/2017 2000   GLUCOSEU NEGATIVE 01/15/2017 2000   GLUCOSEU Negative 09/22/2016 1553   HGBUR SMALL (A) 01/15/2017 2000   BILIRUBINUR NEGATIVE 01/15/2017 2000   BILIRUBINUR Negative 01/06/2017 1608   BILIRUBINUR Negative 09/22/2016 1553   [place] 01/15/2017 2000   PROTEINUR NEGATIVE 01/15/2017 2000   UROBILINOGEN 0.2 01/06/2017 1608   UROBILINOGEN 0.2 11/15/2016 1358   UROBILINOGEN 0.2 09/22/2016 1553   NITRITE NEGATIVE 01/15/2017 2000   LEUKOCYTESUR NEGATIVE 01/15/2017 2000   LEUKOCYTESUR Negative 09/22/2016 1553   Results for JOSIAH, WOJTASZEK (MRN [id number]) as of 06/23/2016 17:38  Ref. Range 03/24/2016 15:15 04/07/2016 14:40 04/20/2016 14:33 04/27/2016 14:34 05/05/2016 14:46 05/19/2016 14:27 05/26/2016 14:09 06/23/2016 14:25  INR Latest Ref Range: 2.00 - 3.50  1.90 (L) 2.60 1.30 (L) 3.70 (H) 2.60 3.80 (H) 1.80 (L) 2.90   STUDIES: Ct Chest W Contrast  Result Date: 05/02/2017 CLINICAL DATA:  Metastatic right breast cancer with new pathologic fracture EXAM: CT CHEST WITH CONTRAST TECHNIQUE: Multidetector CT imaging of the chest was performed during intravenous contrast administration. CONTRAST:  40m ISOVUE-300 IOPAMIDOL (ISOVUE-300) INJECTION 61% COMPARISON:  02/20/2015 FINDINGS: Cardiovascular: Heart is normal in size.  No pericardial effusion. No evidence of thoracic aortic aneurysm. Mild atherosclerotic calcifications of the aortic  root. Mediastinum/Nodes: No suspicious mediastinal, hilar, or axillary lymphadenopathy. Visualized thyroid is unremarkable. Lungs/Pleura: Biapical pleural-parenchymal scarring. Mild radiation changes in the anterior right upper lobe. 3 mm subpleural nodule in the anterior right upper lobe (series 7/image 64), technically nonspecific but likely benign. Calcified granuloma at the right lung base (series 7/image 113). Mild subpleural reticulation in the posterior bilateral lower lobes. No focal consolidation. No pleural effusion or pneumothorax. Upper Abdomen: Visualized upper abdomen is notable for a calcified splenic granuloma. Musculoskeletal: Degenerative changes of the visualized thoracolumbar spine. Mild superior endplate changes at L1. Skin thickening overlying the right breast. IMPRESSION: No findings specific for metastatic disease in the chest. 3 mm subpleural nodule in the anterior right upper lobe is favored to be benign. Consider follow-up CT chest in 6 months. Radiation changes in the right upper lobe. Aortic Atherosclerosis (ICD10-I70.0). Electronically Signed   By: SJulian HyM.D.   On: 05/02/2017 17:44   Nm Bone Scan Whole Body  Result Date: 05/02/2017 CLINICAL DATA:  76year old female with with breast cancer leading to pathologic fracture of the left hip requiring left total hip prosthesis. Subsequent peri prostatic fracture requiring second surgery. Continued left hip pain post surgery. Subsequent encounter. EXAM: NUCLEAR MEDICINE WHOLE BODY BONE SCAN TECHNIQUE: Whole body anterior and posterior images were obtained approximately 3 hours after intravenous injection of radiopharmaceutical. RADIOPHARMACEUTICALS:  20.3 mCi Technetium-914mDP IV COMPARISON:  02/01/2017 plain film exam. 01/19/2017 and 01/15/2017 CT. FINDINGS: Radiotracer uptake surrounding the left hip prosthesis (which may be related to postoperative changes) limits evaluation for detection of metastatic disease in this  region. Two prominent focal areas of radiotracer uptake surrounding left hip prosthesis involve the left acetabulum and left greater trochanter region. The left acetabular radiotracer uptake may be related to screw placement. No lytic lesions seen at this level on prior  CT. Radiotracer uptake involving the left greater trochanter is immediately adjacent to where the initial lytic lesion was seen. On the postoperative exam, no lytic lesion is noted in this region (although evaluation limited by metallic artifact). Attention to this on follow-up. Increased uptake involving the knee joint bilaterally greater on left. Distribution suggestive of result of degenerative changes without lytic lesions seen on prior plain film exam of the left knee. Radiotracer uptake involving the lumbar spine on the left at the L4-5 level and on the right at the L5-S1 level consistent with degenerative changes as noted on prior CT. Minimal increased radiotracer uptake right aspect of mid and lower thoracic spine probably related to degenerative changes. Both kidneys visualized. IMPRESSION: Overall, no definitive bone scan evidence of osseous metastatic disease. Radiotracer uptake surrounding the left hip prosthesis (which may be related to postoperative changes) limits evaluation for detection of metastatic disease in this region. Two prominent focal areas of radiotracer uptake surrounding left hip prosthesis involve the left acetabulum and left greater trochanter region. 1) Left acetabular radiotracer uptake may be related to screw placement. No lytic lesions seen at this level on prior CT. 2) Radiotracer uptake involving the left greater trochanter is immediately adjacent to where the initial lytic lesion was seen. On the postoperative exam, no lytic lesion is noted in this region (although evaluation limited by metallic artifact). Attention to these areas on follow-up. Increased uptake involving the knee joint bilaterally greater on  left. Distribution suggestive of result of degenerative changes without lytic lesions seen on prior plain film exam of the left knee. Electronically Signed   By: Genia Del M.D.   On: 05/02/2017 17:48    ASSESSMENT: 76 y.o. [place] woman status post right breast upper outer quadrant lumpectomy and sentinel lymph node sampling 09/09/2013 for an mpT1c pN1a, stage IIA invasive ductal carcinoma, estrogen and progesterone receptor both 100% positive with strong staining intensity, MIB-1 of 17% and no HER-2 amplification  (1) additional surgery for margin clearance 09/16/2013 obtained negative margins  (2) Oncotype DX recurrence score of 4 predicts a risk of outside the breast recurrence within 10 years of 7% if the patient's only systemic therapy is tamoxifen for 5 years. It also predicts no benefit from chemotherapy  (3) adjuvant radiation completed 01/07/2014  (4) anastrozole started 02/27/2014 stopped within 2 weeks because of arm swelling.   (a) bone density April 2016 showed osteopenia, with a t-score of -1.6  (b) anastrozole resumed 12/17/2015  (5) history of left lower extremity DVT 11/23/2012, initially on rivaroxaban, which caused chest pain, switch to Coumadin July 2014   METASTATIC DISEASE: August 2018 (6) status post left total hip replacement 01/16/2017 for estrogen receptor positive adenocarcinoma.  PLAN:   Alicia Vasquez is doing well today.  I reviewed her scans with her today which show no other sign of involvement of the cancer.  This is good news.  She is tolerating the Fulvestrant and Xgeva well and will continue this.  Her pain is controlled with Norco, and I have refilled this today.  She continues on [place] dosing Coumadin.  She has no issues with easy bruising or bleeding.  Her INR is 1.3 today.  She appears to be feeling better than she did at her last visit.  She was encouraged to continue to work on her mobility and regaining her strength at home.  She will return in 4 weeks to  see Dr. Jana Hakim for follow up.  She knows to call for any questions or concerns  prior to her next appointment with [name].    A total of (30) minutes of face-to-face time was spent with this patient with greater than 50% of that time in counseling and care-coordination.       Scot Dock, NP   05/16/2017 2:36 PM

## 2017-05-17 ENCOUNTER — Ambulatory Visit: Payer: Self-pay | Admitting: *Deleted

## 2017-05-17 ENCOUNTER — Telehealth: Payer: Self-pay | Admitting: Oncology

## 2017-05-17 NOTE — Telephone Encounter (Signed)
No 12/18 los. °

## 2017-05-25 ENCOUNTER — Encounter: Payer: Self-pay | Admitting: *Deleted

## 2017-05-25 ENCOUNTER — Other Ambulatory Visit: Payer: Self-pay | Admitting: *Deleted

## 2017-05-25 DIAGNOSIS — M1712 Unilateral primary osteoarthritis, left knee: Secondary | ICD-10-CM | POA: Diagnosis not present

## 2017-05-25 DIAGNOSIS — Z96642 Presence of left artificial hip joint: Secondary | ICD-10-CM | POA: Diagnosis not present

## 2017-05-25 DIAGNOSIS — Z4789 Encounter for other orthopedic aftercare: Secondary | ICD-10-CM | POA: Diagnosis not present

## 2017-05-25 DIAGNOSIS — M978XXD Periprosthetic fracture around other internal prosthetic joint, subsequent encounter: Secondary | ICD-10-CM | POA: Diagnosis not present

## 2017-05-25 NOTE — Patient Outreach (Signed)
[facility]) Care Management  05/25/2017  [place] 01/09/1941 [id number]   CSW made a third and final attempt to try and contact patient today to perform phone assessment, as well as assess and assist with social work needs and services, without success.  A HIPAA compliant message was left for patient on voicemail.  CSW  continues to await a return call.  CSW will mail an outreach letter to patient's home, encouraging patient to contact CSW at their earliest convenience, if patient is interested in receiving social work services through [place] with [facility].  If CSW does not receive a return call from patient within the next 10 business days, CSW will proceed with case closure.  Required number of phone attempts will have been made and outreach letter mailed.   Nat Christen, BSW, MSW, LCSW  Licensed Education officer, environmental Health System  Mailing [place] [address], [place], [place] [address] Physical Address-[address], [place], [place] [address] Toll Free Main # [phone] Fax # [phone] Cell # [phone]  Office # [phone] [email or web address]

## 2017-06-01 NOTE — Patient Outreach (Signed)
Request received from Joanna Saporito, LCSW to mail patient personal care resources.  Information mailed today. 

## 2017-06-08 ENCOUNTER — Other Ambulatory Visit: Payer: Self-pay | Admitting: *Deleted

## 2017-06-08 ENCOUNTER — Ambulatory Visit (INDEPENDENT_AMBULATORY_CARE_PROVIDER_SITE_OTHER): Payer: Medicare Other | Admitting: Internal Medicine

## 2017-06-08 ENCOUNTER — Encounter: Payer: Self-pay | Admitting: Internal Medicine

## 2017-06-08 ENCOUNTER — Encounter: Payer: Self-pay | Admitting: *Deleted

## 2017-06-08 VITALS — BP 138/80 | HR 64 | Ht 64.0 in | Wt 203.0 lb

## 2017-06-08 DIAGNOSIS — E559 Vitamin D deficiency, unspecified: Secondary | ICD-10-CM | POA: Diagnosis not present

## 2017-06-08 DIAGNOSIS — E213 Hyperparathyroidism, unspecified: Secondary | ICD-10-CM

## 2017-06-08 DIAGNOSIS — E039 Hypothyroidism, unspecified: Secondary | ICD-10-CM

## 2017-06-08 NOTE — Patient Instructions (Signed)
Please continue vitamin D 5000 units daily.  Please come back for a follow-up appointment in 1 year.

## 2017-06-08 NOTE — Patient Outreach (Signed)
[facility]) Care Management  06/08/2017  [place] [birth year] [id number]   CSW will perform a case closure on patient, due to inability to maintain phone contact, despite required number of phone attempts made and outreach letter mailed to patient's home, allowing 10 business days for a response.  CSW will fax an update to patient's Primary Care Physician, Dr. Briscoe Deutscher to ensure that they are aware of CSW's involvement with patient's plan of care.  CSW will submit a case closure request to Alycia Rossetti, Care Management Assistant with [facility], in the form of an [employer].   Nat Christen, BSW, MSW, LCSW  Licensed Education officer, environmental Health System  Mailing [place] [address], [place], [place] [address] Physical Address-[address], [place],  [address] Toll Free Main # [phone] Fax # [phone] Cell # [phone]  Office # [phone] [email or web address]

## 2017-06-08 NOTE — Progress Notes (Signed)
Patient ID: Alicia Vasquez, female   DOB: 07-Apr-1941, 77 y.o.   MRN: [id number]   HPI  Alicia Vasquez is a 77 y.o.-year-old female, returning for follow-up for hyperparathyroidism and severe vitamin D deficiency, hypothyroidism.  She is here with her friend who helps her with a wheelchair and offers part of the history especially related to her most recent medical history. Last visit 6 months ago.  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 Sh also had a pelvic fracture at [facility] before last hip replacement. She is in a wheelchair. Now on XGeva  - 2 inj's so far.   Reviewed and addended hx: Pt was dx with found to have an elevated parathyroid hormone in 02/2014.   She also had high calcium levels >> highest 11.5.  I reviewed pt's pertinent labs. Lab Results  Component Value Date   PTH 46 05/05/2017   PTH 26 12/06/2016   PTH Comment 12/06/2016   PTH 31 12/07/2015   PTH Comment 12/07/2015   PTH 37 06/09/2015   PTH Comment 06/09/2015   PTH 37 12/19/2014   PTH Comment 12/19/2014   PTH 88 (H) 07/27/2014   CALCIUM 11.0 (H) 05/16/2017   CALCIUM 10.7 (H) 05/05/2017   CALCIUM 10.8 (H) 05/05/2017   CALCIUM 11.5 (H) 04/18/2017   CALCIUM 10.2 02/02/2017   CALCIUM 10.0 02/01/2017   CALCIUM 10.4 (H) 01/31/2017   CALCIUM 10.0 01/30/2017   CALCIUM 10.0 01/29/2017   CALCIUM 9.9 01/27/2017   Vitamin D deficiency-PTH normalized after normalization of her vitamin D levels, however, calcium levels remained elevated.  Pt was taking 1000 IU vitamin D in the past - a level was very low then >> we increased her vitamin D supplement to 5000 units daily >> vitamin D level normalized.  She is taking this dose consistently.   Reviewed latest vitamin D levels: Normal Lab Results  Component Value Date   VD25OH 63 05/05/2017   VD25OH 45.98 12/06/2016   VD25OH 47.68  12/07/2015   VD25OH 45.83 06/09/2015   VD25OH 30.58 12/19/2014   VD25OH 26.82 (L) 10/17/2014   VD25OH 7.19 (L) 04/18/2014   Magnesium, phosphorus, Calcitriol levels were normal: Component     Latest Ref Rng 04/18/2014         3:04 PM  Vitamin D 1, 25 (OH) Total     18 - 72 pg/mL 55  Vitamin D3 1, 25 (OH)      55  Vitamin D2 1, 25 (OH)      <8  Magnesium     1.5 - 2.5 mg/dL 2.1  Phosphorus     2.3 - 4.6 mg/dL 2.8   I did advise her to perform a 24-hour urine collection, but she did not do this yet.  Reviewed  previous DEXA scans: - 09/03/2014: no osteoporosis/but osteopenia at LFN: -1.6 - 08/2016: Osteopenia RFN: -2.2, LFN: -2.1; R radius 33% distal: -0.2  She had the following fractures: - 1994: R foot  - 1990's: L hairline fracture  - 12/2016: Periprosthetic pathological hip fracture  She was started on Xgeva.  No history of kidney stones.  No CKD. Last BUN/Cr: Lab Results  Component Value Date   BUN 13.2 05/16/2017   CREATININE 0.8 05/16/2017   She also has a history of breast cancer and is on Arimidex. She had RxTx. No ChTx.  Hypothyroidism: Pt is  on levothyroxine 125 mcg daily, taken: - in am - fasting - at least 30 min from b'fast - no Ca, Fe, MVI, PPIs - not on Biotin  Last TSH was normal: Lab Results  Component Value Date   TSH 3.14 05/05/2017   Pt denies: - feeling nodules in neck - dysphagia - choking - SOB with lying down But she does have long-standing hoarseness.  ROS: Constitutional: + weight loss, no fatigue, no subjective hyperthermia, no subjective hypothermia Eyes: no blurry vision, no xerophthalmia ENT: no sore throat, + see HPI Cardiovascular: no CP/no SOB/no palpitations/+ leg swelling Respiratory: no cough/no SOB/no wheezing Gastrointestinal: no N/no V/no D/no C/no acid reflux Musculoskeletal: no muscle aches/+ joint aches Skin: no rashes, no hair loss Neurological: no tremors/no numbness/no tingling/no dizziness  I  reviewed pt's medications, allergies, PMH, social hx, family hx, and changes were documented in the history of present illness. Otherwise, unchanged from my initial visit note.  Since last visit, she stopped lisinopril and also started Robaxin.   Past Medical History:  Diagnosis Date  . Allergy   . Anxiety   . Arthritis   . Blood transfusion without reported diagnosis   . Breast cancer ([place]) 07/12/2013   Invasive Mammary Carcinoma  . DVT (deep vein thrombosis) in pregnancy ([place])   . Hypertension   . Hypothyroid   . Personal history of radiation therapy   . Pneumonia   . Radiation 11/21/13-01/07/14   Right Breast/Supraclavicular   Past Surgical History:  Procedure Laterality Date  . BREAST LUMPECTOMY WITH RADIOACTIVE SEED LOCALIZATION Right 09/09/2013   Procedure: BREAST LUMPECTOMY WITH RADIOACTIVE SEED LOCALIZATION WITH AXILLARY NODE EXCISION;  Surgeon: [name], MD;  Location: [place];  Service: General;  Laterality: Right;  . DENTAL SURGERY  04/19/2012   13 TEETH REMOVED  . DILATION AND CURETTAGE OF UTERUS    . ORIF PERIPROSTHETIC FRACTURE Left 01/31/2017   Procedure: REVISION and OPEN REDUCTION INTERNAL FIXATION (ORIF) PERIPROSTHETIC FRACTURE LEFT HIP;  Surgeon: Paralee Cancel, MD;  Location: [facility];  Service: Orthopedics;  Laterality: Left;  120 mins  . RE-EXCISION OF BREAST LUMPECTOMY Right 09/24/2013   Procedure: RE-EXCISION OF RIGHT BREAST LUMPECTOMY;  Surgeon: [name], MD;  Location: [place];  Service: General;  Laterality: Right;  . TOTAL HIP ARTHROPLASTY Left 01/16/2017   Procedure: TOTAL HIP ARTHROPLASTY POSTERIOR;  Surgeon: Paralee Cancel, MD;  Location: [facility];  Service: Orthopedics;  Laterality: Left;   History   Social History  . Marital Status: Widowed    Spouse Name: N/A    Number of Children: 1   Occupational History  . Retired Radio broadcast assistant   Social History Main Topics  . Smoking status: Never Smoker   .  Smokeless tobacco: Not on file  . Alcohol Use: No  . Drug Use: No   Social History Narrative   Exercise yard work   Current Outpatient Medications on File Prior to Visit  Medication Sig Dispense Refill  . Cholecalciferol (VITAMIN D3) 5000 UNITS TABS Take 5,000 Units by mouth daily.     Marland Kitchen HYDROcodone-acetaminophen (NORCO/VICODIN) 5-325 MG tablet Take 1 tablet by mouth every 6 (six) hours as needed for moderate pain. 120 tablet 0  . ketoconazole (NIZORAL) 2 % cream Apply 1 application topically daily. 15 g 0  . levothyroxine (SYNTHROID) 125 MCG tablet Take 1 tablet (125 mcg total) by mouth daily before breakfast. Dispense as written: Synthroid LAST REFILL 90 tablet 3  . methocarbamol (ROBAXIN) 500 MG tablet Take  1 tablet (500 mg total) by mouth every 6 (six) hours as needed for muscle spasms. 40 tablet 0  . polyethylene glycol (MIRALAX / GLYCOLAX) packet Take 17 g by mouth 2 (two) times daily. 14 each 0  . warfarin (JANTOVEN) 5 MG tablet Take 1 tablet (5 mg total) by mouth daily at 6 PM. 30 tablet 4   No current facility-administered medications on file prior to visit.    Allergies  Allergen Reactions  . Anesthetics, Amide Hypertension  . Benadryl [Diphenhydramine Hcl] Other (See Comments)    Dizziness  . Carbocaine [Mepivacaine Hcl] Hypertension  . Codeine Other (See Comments)    Dizziness  . Epinephrine Hypertension  . Sulfa Antibiotics Other (See Comments)    dizziness  . Latex Other (See Comments)    Blisters in mouth  . Tramadol     Sedation.   Marland Kitchen Penicillins Rash    Has patient had a PCN reaction causing immediate rash, facial/tongue/throat swelling, SOB or lightheadedness with hypotension: Unknown Has patient had a PCN reaction causing severe rash involving mucus membranes or skin necrosis: Unknown Has patient had a PCN reaction that required hospitalization: Unknown Has patient had a PCN reaction occurring within the last 10 years: Unknown If all of the above answers are  "NO", then may proceed with Cephalosporin use.    Family History  Problem Relation Age of Onset  . Heart disease Brother   . Colon cancer Brother   . Prostate cancer Brother    PE: BP 138/80   Pulse 64   Ht 5\' 4"  (1.626 m)   Wt 203 lb (92.1 kg)   SpO2 98%   BMI 34.84 kg/m   Body mass index is 34.84 kg/m. Wt Readings from Last 3 Encounters:  06/08/17 203 lb (92.1 kg)  01/26/17 205 lb (93 kg)  01/25/17 205 lb (93 kg)   Constitutional: overweight, in NAD, in wheelchair Eyes: PERRLA, EOMI, no exophthalmos ENT: moist mucous membranes, no thyromegaly, no cervical lymphadenopathy Cardiovascular: RRR, No MRG, + bilateral pitting edema Respiratory: CTA B Gastrointestinal: abdomen soft, NT, ND, BS+ Musculoskeletal: no deformities, strength intact in all 4 Skin: moist, warm, no rashes Neurological: no tremor with outstretched hands, DTR normal in all 4  Assessment: 1. Normocalcemic hyperparathyroidism  2. H/o Vitamin D deficiency  3. Hypothyroidism  Plan: 1. Hyperparathyroidism  - Patient has an interesting history of mostly normal calcium levels in the past with elevated PTH levels and low vitamin D.  Her PTH levels normalized after normalizing her vitamin D level, however, calcium started to increase and remains elevated.  Calcitriol level, magnesium, phosphorus were normal.  Recent vitamin D level was also normal, on 5000 units daily.  Most recent calcium was 11. - No history of nephrolithiasis, no clear history of osteoporosis (of note, 33% radial BMD was normal and higher than the rest of the scores on her DXA scan from 08/2016), but she had a recent pelvic fracture which qualifies her for osteoporosis - Since last visit, she unfortunately developed left hip breast cancer metastasis (reviewed pathology: This is hormone sensitive) and she was started on Xgeva.  I explained that this will help decrease her calcium levels.  She already had 2 recent surgeries and now has recurrent  metastatic breast cancer and we discussed that I would not suggest to send her to surgery for her most likely primary hyperparathyroidism, but I feel he would be safe for her to be followed clinically for now.  She absolutely agrees. - I  will see the patient back 1 year  2. H/o Severe vitamin D deficiency - Latest vitamin D level was normal last month - Continue 5000 units vitamin D daily  3. Hypothyroidism - latest thyroid labs reviewed with pt >> normal last month - she continues on LT4 125 mcg daily - pt feels good on this dose. - we discussed about taking the thyroid hormone every day, with water, >30 minutes before breakfast, separated by >4 hours from acid reflux medications, calcium, iron, multivitamins. Pt. is taking it correctly  Philemon Kingdom, MD PhD [facility]

## 2017-06-12 NOTE — Progress Notes (Signed)
[facility]  [phone] [phone]     ID: Alicia Vasquez OB: July 17, 1940  MR#: [id number]  WOE#:[id number]  PCP: Briscoe Deutscher, DO GYN:   SU: [name] OTHER MD: Thea Silversmith, Hart Robinsons, Philemon Kingdom  CHIEF COMPLAINT: Estrogen receptor positive breast cancer  CURRENT TREATMENT:  Fulvestrant and Delton See   INTERVAL HISTORY: Alicia Vasquez is here today for evaluation of her metastatic estrogen receptor positive breast cancer accompanied by her friends, [name] and [place]. She continues on fulvestrant with a dose due today. She tolerates this generally well. She notes that she is still having pain from the last shot.  She also receives monthly Xgeva. She also tolerates this well. She reports that her arm was swollen and hot after the shot, and she wasn't able to contact anyone about it.    REVIEW OF SYSTEMS: Alicia Vasquez reports that she feels tingling at her left knee that extends down to her foot. She also notes that her left hip is hurting. She takes 125 mg hydrocodone two pills in the morning, 1 pill 6 hours later, and 2 more pills at night. Sometimes she takes it 4 times a day. She notes that if she doesn't take any at night, she wakes up in the middles of the night with pain and numbness in her left leg. She notes that she has a bedside commode. She also has a walker at home. She notes that she is physically doing better. Alicia Vasquez notes that she get anxious when she comes in the house, and her leg freezes up. Alicia Vasquez reports that she is able to help with chores around the house. She tries to cook and sweep the floor. She notes that she has 1-2 bowel movements per day. She notes a bump on her left thumb. She reports that she has been taking 7.5 mg of coumadin per day. She denies unusual headaches, visual changes, nausea, vomiting, or dizziness. There has been no unusual cough, phlegm production, or pleurisy. This been no change in bowel or bladder  habits. She denies unexplained fatigue or unexplained weight loss, bleeding, rash, or fever. A detailed review of systems was otherwise stable.    BREAST CANCER HISTORY: From doctor [name]'s intake node 07/24/2013:  "77 y.o. female. Who presented with SOB and had a CT chest performed that revealed a right breast mass. Mammogram/ultrsound on 2/13 showed a mass in the 11 o'clock position in the right breast measuring 1.5 cm. Also noted was a right axillary LN measuring 1.6 cm. MRI not performed. Biopsy of mass and lymph done. 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 Lymph node + for metastatic carcinoma."  [On 09/09/2013 the patient underwent right lumpectomy and sentinel lymph node sampling. This showed ([id number]) multifocal invasive ductal carcinoma, grade 1, the largest lesion measuring 1.8 cm, the second lesion 1.2 cm. One of 4 sentinel lymph nodes was positive, with extracapsular extension. Margins were positive. HER-2 was repeated and was again negative. Further surgery 09/16/2013 obtained clear margins.  Her subsequent history is as detailed below  PAST MEDICAL HISTORY: Past Medical History:  Diagnosis Date  . Allergy   . Anxiety   . Arthritis   . Blood transfusion without reported diagnosis   . Breast cancer ([facility]) 07/12/2013   Invasive Mammary Carcinoma  . DVT (deep vein thrombosis) in pregnancy ([facility])   . Hypertension   . Hypothyroid   . Personal  history of radiation therapy   . Pneumonia   . Radiation 11/21/13-01/07/14   Right Breast/Supraclavicular    PAST SURGICAL HISTORY: Past Surgical History:  Procedure Laterality Date  . BREAST LUMPECTOMY WITH RADIOACTIVE SEED LOCALIZATION Right 09/09/2013   Procedure: BREAST LUMPECTOMY WITH RADIOACTIVE SEED LOCALIZATION WITH AXILLARY NODE EXCISION;  Surgeon: [name], MD;  Location: [facility];   Service: General;  Laterality: Right;  . DENTAL SURGERY  04/19/2012   13 TEETH REMOVED  . DILATION AND CURETTAGE OF UTERUS    . ORIF PERIPROSTHETIC FRACTURE Left 01/31/2017   Procedure: REVISION and OPEN REDUCTION INTERNAL FIXATION (ORIF) PERIPROSTHETIC FRACTURE LEFT HIP;  Surgeon: Paralee Cancel, MD;  Location: [facility];  Service: Orthopedics;  Laterality: Left;  120 mins  . RE-EXCISION OF BREAST LUMPECTOMY Right 09/24/2013   Procedure: RE-EXCISION OF RIGHT BREAST LUMPECTOMY;  Surgeon: [name], MD;  Location: [facility];  Service: General;  Laterality: Right;  . TOTAL HIP ARTHROPLASTY Left 01/16/2017   Procedure: TOTAL HIP ARTHROPLASTY POSTERIOR;  Surgeon: Paralee Cancel, MD;  Location: [facility];  Service: Orthopedics;  Laterality: Left;    FAMILY HISTORY Family History  Problem Relation Age of Onset  . Heart disease Brother   . Colon cancer Brother   . Prostate cancer Brother    the patient's father died at the age of 78 after an automobile accident. The patient's mother died at the age of 96. She was a Marine scientist here in [name] in the old [facility]. She was infected with polio and was confined to a wheelchair for a good part of her life. She eventually died of pneumonia. The patient had one brother, who died with prostate cancer. She had no sisters. There is no history of breast or ovarian cancer in the family.  GYNECOLOGIC HISTORY:  Menarche age 16, first live birth age 67, the patient is GX P1. She went through the change of life at age 71. 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 She is widowed. Currently she is staying with her friend Alicia Vasquez,  89,who is a retired Radio producer. The patient's son Alicia Vasquez lives in [place]. He works an Engineer, technical sales. The patient has no grandchildren. She is a Tourist information centre manager but currently attends a [employer] with her friend Alicia Vasquez   ADVANCED  DIRECTIVES: Not in place   HEALTH MAINTENANCE: Social History   Tobacco Use  . Smoking status: Never Smoker  . Smokeless tobacco: Never Used  Substance Use Topics  . Alcohol use: No    Alcohol/week: 0.0 oz  . Drug use: No     Colonoscopy: Never  PAP:  Bone density: 10/01/2009; lowest T score -0.8  Lipid panel:  Allergies  Allergen Reactions  . Anesthetics, Amide Hypertension  . Benadryl [Diphenhydramine Hcl] Other (See Comments)    Dizziness  . Carbocaine [Mepivacaine Hcl] Hypertension  . Codeine Other (See Comments)    Dizziness  . Epinephrine Hypertension  . Sulfa Antibiotics Other (See Comments)    dizziness  . Latex Other (See Comments)    Blisters in mouth  . Tramadol     Sedation.   Marland Kitchen Penicillins Rash    Has patient had a PCN reaction causing immediate rash, facial/tongue/throat swelling, SOB or lightheadedness with hypotension: Unknown Has patient had a PCN reaction causing severe rash involving mucus membranes or skin necrosis: Unknown Has patient had a PCN reaction that required hospitalization: Unknown Has patient  had a PCN reaction occurring within the last 10 years: Unknown If all of the above answers are "NO", then may proceed with Cephalosporin use.     Current Outpatient Medications  Medication Sig Dispense Refill  . Cholecalciferol (VITAMIN D3) 5000 UNITS TABS Take 5,000 Units by mouth daily.     Marland Kitchen HYDROcodone-acetaminophen (NORCO/VICODIN) 5-325 MG tablet Take 1 tablet by mouth every 6 (six) hours as needed for moderate pain. 120 tablet 0  . levothyroxine (SYNTHROID) 125 MCG tablet Take 1 tablet (125 mcg total) by mouth daily before breakfast. Dispense as written: Synthroid LAST REFILL 90 tablet 3  . polyethylene glycol (MIRALAX / GLYCOLAX) packet Take 17 g by mouth 2 (two) times daily. 14 each 0  . warfarin (JANTOVEN) 5 MG tablet Take 1 tablet (5 mg total) by mouth daily at 6 PM. 30 tablet 4   No current facility-administered medications for this  visit.    Facility-Administered Medications Ordered in Other Visits  Medication Dose Route Frequency Provider Last Rate Last Dose  . denosumab (XGEVA) injection 120 mg  120 mg Subcutaneous Once Causey, Charlestine Massed, NP      . fulvestrant (FASLODEX) injection 500 mg  500 mg Intramuscular Once Makynleigh Breslin, [name], MD        OBJECTIVE: Older white woman who appears stated age  26:   06/13/17 1525  BP: 129/84  Pulse: 76  Resp: 18  Temp: 97.8 F (36.6 C)  SpO2: 97%     Body mass index is 34.84 kg/m.    ECOG FS:2  Sclerae unicteric, EOMs intact Oropharynx clear, slightly dry No cervical or supraclavicular adenopathy Lungs no rales or rhonchi Heart regular rate and rhythm Abd soft, is, nontender, positive bowel sounds MSK no focal spinal tenderness, decreased range of motion left upper extremity, grade 1 bilateral ankle edema Neuro: nonfocal, well oriented, appropriate affect Breasts: Deferred   LAB RESULTS:  CMP     Component Value Date/Time   NA 138 06/13/2017 1513   NA 141 05/16/2017 1416   K 4.2 06/13/2017 1513   K 3.8 05/16/2017 1416   CL 106 06/13/2017 1513   CO2 25 06/13/2017 1513   CO2 27 05/16/2017 1416   GLUCOSE 138 06/13/2017 1513   GLUCOSE 140 05/16/2017 1416   BUN 17 06/13/2017 1513   BUN 13.2 05/16/2017 1416   CREATININE 0.73 06/13/2017 1513   CREATININE 0.8 05/16/2017 1416   CALCIUM 10.0 06/13/2017 1513   CALCIUM 11.0 (H) 05/16/2017 1416   PROT 6.7 06/13/2017 1513   PROT 6.9 05/16/2017 1416   ALBUMIN 3.6 06/13/2017 1513   ALBUMIN 3.6 05/16/2017 1416   AST 11 06/13/2017 1513   AST 13 05/16/2017 1416   ALT 11 06/13/2017 1513   ALT <6 05/16/2017 1416   ALKPHOS 112 06/13/2017 1513   ALKPHOS 130 05/16/2017 1416   BILITOT 0.2 06/13/2017 1513   BILITOT 0.31 05/16/2017 1416   GFRNONAA >60 06/13/2017 1513   GFRNONAA 75 08/19/2015 1602   GFRAA >60 06/13/2017 1513   GFRAA 87 08/19/2015 1602    I No results found for: SPEP  Lab Results    Component Value Date   WBC 6.6 06/13/2017   NEUTROABS 4.2 06/13/2017   HGB 12.0 06/13/2017   HCT 37.6 06/13/2017   MCV 80.4 06/13/2017   PLT 302 06/13/2017      Chemistry      Component Value Date/Time   NA 138 06/13/2017 1513   NA 141 05/16/2017 1416   K 4.2  06/13/2017 1513   K 3.8 05/16/2017 1416   CL 106 06/13/2017 1513   CO2 25 06/13/2017 1513   CO2 27 05/16/2017 1416   BUN 17 06/13/2017 1513   BUN 13.2 05/16/2017 1416   CREATININE 0.73 06/13/2017 1513   CREATININE 0.8 05/16/2017 1416      Component Value Date/Time   CALCIUM 10.0 06/13/2017 1513   CALCIUM 11.0 (H) 05/16/2017 1416   ALKPHOS 112 06/13/2017 1513   ALKPHOS 130 05/16/2017 1416   AST 11 06/13/2017 1513   AST 13 05/16/2017 1416   ALT 11 06/13/2017 1513   ALT <6 05/16/2017 1416   BILITOT 0.2 06/13/2017 1513   BILITOT 0.31 05/16/2017 1416       No results found for: LABCA2  No components found for: LABCA125  Recent Labs  Lab 06/13/17 1513  INR 1.38    Urinalysis    Component Value Date/Time   COLORURINE STRAW (A) 01/15/2017 2000   APPEARANCEUR CLEAR 01/15/2017 2000   LABSPEC 1.008 01/15/2017 2000   LABSPEC 1.010 09/22/2016 1553   PHURINE 6.0 01/15/2017 2000   GLUCOSEU NEGATIVE 01/15/2017 2000   GLUCOSEU Negative 09/22/2016 1553   HGBUR SMALL (A) 01/15/2017 2000   BILIRUBINUR NEGATIVE 01/15/2017 2000   BILIRUBINUR Negative 01/06/2017 1608   BILIRUBINUR Negative 09/22/2016 1553   KETONESUR NEGATIVE 01/15/2017 2000   PROTEINUR NEGATIVE 01/15/2017 2000   UROBILINOGEN 0.2 01/06/2017 1608   UROBILINOGEN 0.2 11/15/2016 1358   UROBILINOGEN 0.2 09/22/2016 1553   NITRITE NEGATIVE 01/15/2017 2000   LEUKOCYTESUR NEGATIVE 01/15/2017 2000   LEUKOCYTESUR Negative 09/22/2016 1553    STUDIES: No results found.  ASSESSMENT: 77 y.o. [place] woman status post right breast upper outer quadrant lumpectomy and sentinel lymph node sampling 09/09/2013 for an mpT1c pN1a, stage IIA invasive ductal  carcinoma, estrogen and progesterone receptor both 100% positive with strong staining intensity, MIB-1 of 17% and no HER-2 amplification  (1) additional surgery for margin clearance 09/16/2013 obtained negative margins  (2) Oncotype DX recurrence score of 4 predicts a risk of outside the breast recurrence within 10 years of 7% if the patient's only systemic therapy is tamoxifen for 5 years. It also predicts no benefit from chemotherapy  (3) adjuvant radiation completed 01/07/2014  (4) anastrozole started 02/27/2014 stopped within 2 weeks because of arm swelling.   (a) bone density April 2016 showed osteopenia, with a t-score of -1.6  (b) anastrozole resumed 12/17/2015  (c) Bone density 09/20/2016 showed a T score of -2.2  (5) history of left lower extremity DVT 11/23/2012, initially on rivaroxaban, which caused chest pain, switch to Coumadin July 2014  (b) coumadin dose increased to 7.5/10 mg alternating days as of 06/13/2017   METASTATIC DISEASE: August 2018 (6) status post left total hip replacement 01/16/2017 for estrogen receptor positive adenocarcinoma.  (a) CA 27-29 was 46.4 as of 04/18/2017  (b) chest CT scan 05/02/2017 shows no lung or liver lesion concern; it does show aortic atherosclerosis  (c) baseline bone scan 05/02/2017 was negative  (7) fulvestrant started 04/18/2017  (8) denosumab/Xgeva started 05/16/2017  PLAN:  I spent approximately 30 minutes with Stanton Kidney with most of that time spent discussing her complex problems. She is tolerating the fulvestrant moderately well, though she has significant dicomfort from the shots. This can be due to hitting a nerve during administration and I am hoping today's dose will be easier on her.  She is doing well with the Xgeva. The plan is to continue that monthly and repeat a bone scan  after 4-6 months. By then any lytic lesions (silent on bone scan) should be apparent and that can serve as out new baseline.  Her CA 27-29 is mildly  informative, enough that it will be worthwhile following up on it.  We are having trouble getting her IRN therapeutic. I am upping her dose today and we will continut to aim for a 1.8-2.2 range. She is now very reliable re dosing since her friends make sure she takes her medications as prescribed.  Overall, Al is better-- this is because she has more support. She has lost some weight, has more energy, and is somewhat more comfortable, though she still has significant chronic aches which are not primarily related to her cancer, as best as I can tell.  We are therefore continuing the current treatment and she will see me with the next w fulvestrant doses. Once she is stable and we have documentation of response we can start to broaden the visit interval.     Roxene Alviar, [name], MD  06/13/17 4:18 PM Medical Oncology and Hematology [facility] [place], [place] [address] Tel. [phone]    Fax. [phone]  This document serves as a record of services personally performed by Lurline Del, MD. It was created on his behalf by Sheron Nightingale, a trained medical scribe. The creation of this record is based on the scribe's personal observations and the provider's statements to them.   .I have reviewed the above documentation for accuracy and completeness, and I agree with the above.

## 2017-06-13 ENCOUNTER — Inpatient Hospital Stay: Payer: Medicare Other | Attending: Oncology | Admitting: Oncology

## 2017-06-13 ENCOUNTER — Inpatient Hospital Stay: Payer: Medicare Other

## 2017-06-13 ENCOUNTER — Telehealth: Payer: Self-pay | Admitting: Oncology

## 2017-06-13 VITALS — BP 129/84 | HR 76 | Temp 97.8°F | Resp 18 | Ht 64.0 in

## 2017-06-13 DIAGNOSIS — Z5111 Encounter for antineoplastic chemotherapy: Secondary | ICD-10-CM | POA: Insufficient documentation

## 2017-06-13 DIAGNOSIS — M8580 Other specified disorders of bone density and structure, unspecified site: Secondary | ICD-10-CM

## 2017-06-13 DIAGNOSIS — E039 Hypothyroidism, unspecified: Secondary | ICD-10-CM | POA: Diagnosis not present

## 2017-06-13 DIAGNOSIS — Z170 Estrogen receptor positive status [ER+]: Secondary | ICD-10-CM | POA: Insufficient documentation

## 2017-06-13 DIAGNOSIS — Z8701 Personal history of pneumonia (recurrent): Secondary | ICD-10-CM | POA: Diagnosis not present

## 2017-06-13 DIAGNOSIS — Z923 Personal history of irradiation: Secondary | ICD-10-CM | POA: Insufficient documentation

## 2017-06-13 DIAGNOSIS — R202 Paresthesia of skin: Secondary | ICD-10-CM | POA: Insufficient documentation

## 2017-06-13 DIAGNOSIS — Z86711 Personal history of pulmonary embolism: Secondary | ICD-10-CM

## 2017-06-13 DIAGNOSIS — C50411 Malignant neoplasm of upper-outer quadrant of right female breast: Secondary | ICD-10-CM

## 2017-06-13 DIAGNOSIS — I10 Essential (primary) hypertension: Secondary | ICD-10-CM | POA: Diagnosis not present

## 2017-06-13 DIAGNOSIS — Z79818 Long term (current) use of other agents affecting estrogen receptors and estrogen levels: Secondary | ICD-10-CM | POA: Insufficient documentation

## 2017-06-13 DIAGNOSIS — D6859 Other primary thrombophilia: Secondary | ICD-10-CM

## 2017-06-13 DIAGNOSIS — Z7901 Long term (current) use of anticoagulants: Secondary | ICD-10-CM | POA: Insufficient documentation

## 2017-06-13 DIAGNOSIS — I700 Atherosclerosis of aorta: Secondary | ICD-10-CM

## 2017-06-13 DIAGNOSIS — M84659P Pathological fracture in other disease, hip, unspecified, subsequent encounter for fracture with malunion: Secondary | ICD-10-CM

## 2017-06-13 DIAGNOSIS — Z79899 Other long term (current) drug therapy: Secondary | ICD-10-CM | POA: Insufficient documentation

## 2017-06-13 DIAGNOSIS — G893 Neoplasm related pain (acute) (chronic): Secondary | ICD-10-CM

## 2017-06-13 DIAGNOSIS — M899 Disorder of bone, unspecified: Secondary | ICD-10-CM

## 2017-06-13 DIAGNOSIS — M84459A Pathological fracture, hip, unspecified, initial encounter for fracture: Secondary | ICD-10-CM

## 2017-06-13 DIAGNOSIS — F418 Other specified anxiety disorders: Secondary | ICD-10-CM | POA: Diagnosis not present

## 2017-06-13 DIAGNOSIS — M84559P Pathological fracture in neoplastic disease, hip, unspecified, subsequent encounter for fracture with malunion: Secondary | ICD-10-CM

## 2017-06-13 DIAGNOSIS — C7951 Secondary malignant neoplasm of bone: Secondary | ICD-10-CM | POA: Insufficient documentation

## 2017-06-13 DIAGNOSIS — I82401 Acute embolism and thrombosis of unspecified deep veins of right lower extremity: Secondary | ICD-10-CM

## 2017-06-13 DIAGNOSIS — Z86718 Personal history of other venous thrombosis and embolism: Secondary | ICD-10-CM | POA: Insufficient documentation

## 2017-06-13 LAB — COMPREHENSIVE METABOLIC PANEL
ALK PHOS: 112 U/L (ref 40–150)
ALT: 11 U/L (ref 0–55)
AST: 11 U/L (ref 5–34)
Albumin: 3.6 g/dL (ref 3.5–5.0)
Anion gap: 7 (ref 3–11)
BILIRUBIN TOTAL: 0.2 mg/dL (ref 0.2–1.2)
BUN: 17 mg/dL (ref 7–26)
CALCIUM: 10 mg/dL (ref 8.4–10.4)
CO2: 25 mmol/L (ref 22–29)
Chloride: 106 mmol/L (ref 98–109)
Creatinine, Ser: 0.73 mg/dL (ref 0.60–1.10)
GFR calc Af Amer: >60 mL/min (ref >60)
GFR calc non Af Amer: >60 mL/min (ref >60)
GLUCOSE: 138 mg/dL (ref 70–140)
POTASSIUM: 4.2 mmol/L (ref 3.3–4.7)
Sodium: 138 mmol/L (ref 136–145)
TOTAL PROTEIN: 6.7 g/dL (ref 6.4–8.3)

## 2017-06-13 LAB — PROTIME-INR
INR: 1.38
Prothrombin Time: 16.8 seconds — ABNORMAL HIGH (ref 11.4–15.2)

## 2017-06-13 LAB — CBC WITH DIFFERENTIAL/PLATELET
Basophils Absolute: 0.1 10*3/uL (ref 0.0–0.1)
Basophils Relative: 2 %
Eosinophils Absolute: 0.3 10*3/uL (ref 0.0–0.5)
Eosinophils Relative: 4 %
HEMATOCRIT: 37.6 % (ref 34.8–46.6)
HEMOGLOBIN: 12 g/dL (ref 11.6–15.9)
LYMPHS PCT: 18 %
Lymphs Abs: 1.2 10*3/uL (ref 0.9–3.3)
MCH: 25.7 pg (ref 25.1–34.0)
MCHC: 32 g/dL (ref 31.5–36.0)
MCV: 80.4 fL (ref 79.5–101.0)
Monocytes Absolute: 0.9 10*3/uL (ref 0.1–0.9)
Monocytes Relative: 13 %
NEUTROS PCT: 63 %
Neutro Abs: 4.2 10*3/uL (ref 1.5–6.5)
Platelets: 302 10*3/uL (ref 145–400)
RBC: 4.67 MIL/uL (ref 3.70–5.45)
RDW: 17.8 % — ABNORMAL HIGH (ref 11.2–16.1)
WBC: 6.6 10*3/uL (ref 3.9–10.3)

## 2017-06-13 MED ORDER — FULVESTRANT 250 MG/5ML IM SOLN
INTRAMUSCULAR | Status: AC
  Filled 2017-06-13: qty 5

## 2017-06-13 MED ORDER — DENOSUMAB 120 MG/1.7ML ~~LOC~~ SOLN
120.0000 mg | Freq: Once | SUBCUTANEOUS | Status: AC
  Administered 2017-06-13: 120 mg via SUBCUTANEOUS

## 2017-06-13 MED ORDER — WARFARIN SODIUM 5 MG PO TABS: 5.0000 mg | ORAL_TABLET | Freq: Every day | ORAL | 4 refills | Status: DC

## 2017-06-13 MED ORDER — FULVESTRANT 250 MG/5ML IM SOLN
500.0000 mg | Freq: Once | INTRAMUSCULAR | Status: AC
  Administered 2017-06-13: 500 mg via INTRAMUSCULAR

## 2017-06-13 MED ORDER — DENOSUMAB 120 MG/1.7ML ~~LOC~~ SOLN
SUBCUTANEOUS | Status: AC
Start: 2017-06-13 — End: 2017-06-13
  Filled 2017-06-13: qty 1.7

## 2017-06-13 MED ORDER — HYDROCODONE-ACETAMINOPHEN 5-325 MG PO TABS: 1.0000 | ORAL_TABLET | Freq: Four times a day (QID) | ORAL | 0 refills | Status: DC | PRN

## 2017-06-13 MED ORDER — WARFARIN SODIUM 5 MG PO TABS: 7.5000 mg | ORAL_TABLET | Freq: Once | ORAL | 4 refills | Status: DC

## 2017-06-13 NOTE — Telephone Encounter (Signed)
Scheduled appt per 1/15 los - Gave patient AVS and calender per los.  

## 2017-06-15 ENCOUNTER — Ambulatory Visit (INDEPENDENT_AMBULATORY_CARE_PROVIDER_SITE_OTHER): Payer: Medicare Other | Admitting: *Deleted

## 2017-06-15 ENCOUNTER — Encounter: Payer: Self-pay | Admitting: *Deleted

## 2017-06-15 VITALS — BP 146/70 | HR 83 | Ht 64.0 in | Wt 203.0 lb

## 2017-06-15 DIAGNOSIS — Z0000 Encounter for general adult medical examination without abnormal findings: Secondary | ICD-10-CM | POA: Diagnosis not present

## 2017-06-15 NOTE — Patient Instructions (Addendum)
Alicia Vasquez , Thank you for taking time to come for your Medicare Wellness Visit. I appreciate your ongoing commitment to your health goals. Please review the following plan we discussed and let me know if I can assist you in the future.    Ask Oncologist about your flu vaccine, declined today Your pneumonia held due to your poor tolerance of the 1st one You also need a tetanus (the one with pertussis or Tdap)  These can be taken at the pharmacy or as your oncologist dictates  Deaf & Hard of Hearing Division Services - can assist with hearing aid x 1  No reviews  [facility]  [address]  [phone]- [place] [email or web address]   There is a new shingles vaccine called the shingirx Shingrix is a vaccine for the prevention of Shingles in Adults 50 and older.  If you are on Medicare, you can request a prescription from your doctor to be filled at a pharmacy.  Please check with your benefits regarding applicable copays or out of pocket expenses.  The Shingrix is given in 2 vaccines approx 8 weeks apart. You must receive the 2nd dose prior to 6 months from receipt of the first.    These are the goals we discussed: Goals    . Patient Stated     Wants to walk again!  Keep working on your exercises and eating well        This is a list of the screening recommended for you and due dates:  Health Maintenance  Topic Date Due  . Flu Shot  02/15/2018*  . Tetanus Vaccine  06/15/2018*  . Pneumonia vaccines (2 of 2 - PCV13) 06/15/2018*  . DEXA scan (bone density measurement)  Completed  *Topic was postponed. The date shown is not the original due date.      Fall Prevention in the Home Falls can cause injuries. They can happen to people of all ages. There are many things you can do to make your home safe and to help prevent falls. What can I do on the outside of my  home?  Regularly fix the edges of walkways and driveways and fix any cracks.  Remove anything that might make you trip as you walk through a door, such as a raised step or threshold.  Trim any bushes or trees on the path to your home.  Use bright outdoor lighting.  Clear any walking paths of anything that might make someone trip, such as rocks or tools.  Regularly check to see if handrails are loose or broken. Make sure that both sides of any steps have handrails.  Any raised decks and porches should have guardrails on the edges.  Have any leaves, snow, or ice cleared regularly.  Use sand or salt on walking paths during winter.  Clean up any spills in your garage right away. This includes oil or grease spills. What can I do in the bathroom?  Use night lights.  Install grab bars by the toilet and in the tub and shower. Do not use towel bars as grab bars.  Use non-skid mats or decals in the tub or shower.  If you need to sit down in the shower, use a plastic, non-slip stool.  Keep the floor dry. Clean up any water that spills on the floor as soon as it happens.  Remove soap buildup in the tub or shower regularly.  Attach bath mats securely with double-sided non-slip rug tape.  Do  not have throw rugs and other things on the floor that can make you trip. What can I do in the bedroom?  Use night lights.  Make sure that you have a light by your bed that is easy to reach.  Do not use any sheets or blankets that are too big for your bed. They should not hang down onto the floor.  Have a firm chair that has side arms. You can use this for support while you get dressed.  Do not have throw rugs and other things on the floor that can make you trip. What can I do in the kitchen?  Clean up any spills right away.  Avoid walking on wet floors.  Keep items that you use a lot in easy-to-reach places.  If you need to reach something above you, use a strong step stool that has a  grab bar.  Keep electrical cords out of the way.  Do not use floor polish or wax that makes floors slippery. If you must use wax, use non-skid floor wax.  Do not have throw rugs and other things on the floor that can make you trip. What can I do with my stairs?  Do not leave any items on the stairs.  Make sure that there are handrails on both sides of the stairs and use them. Fix handrails that are broken or loose. Make sure that handrails are as long as the stairways.  Check any carpeting to make sure that it is firmly attached to the stairs. Fix any carpet that is loose or worn.  Avoid having throw rugs at the top or bottom of the stairs. If you do have throw rugs, attach them to the floor with carpet tape.  Make sure that you have a light switch at the top of the stairs and the bottom of the stairs. If you do not have them, ask someone to add them for you. What else can I do to help prevent falls?  Wear shoes that: ? Do not have high heels. ? Have rubber bottoms. ? Are comfortable and fit you well. ? Are closed at the toe. Do not wear sandals.  If you use a stepladder: ? Make sure that it is fully opened. Do not climb a closed stepladder. ? Make sure that both sides of the stepladder are locked into place. ? Ask someone to hold it for you, if possible.  Clearly mark and make sure that you can see: ? Any grab bars or handrails. ? First and last steps. ? Where the edge of each step is.  Use tools that help you move around (mobility aids) if they are needed. These include: ? Canes. ? Walkers. ? Scooters. ? Crutches.  Turn on the lights when you go into a dark area. Replace any light bulbs as soon as they burn out.  Set up your furniture so you have a clear path. Avoid moving your furniture around.  If any of your floors are uneven, fix them.  If there are any pets around you, be aware of where they are.  Review your medicines with your doctor. Some medicines can make  you feel dizzy. This can increase your chance of falling. Ask your doctor what other things that you can do to help prevent falls. This information is not intended to replace advice given to you by your health care provider. Make sure you discuss any questions you have with your health care provider. Document Released: 03/12/2009 Document Revised: 10/22/2015  Document Reviewed: 06/20/2014 Elsevier Interactive Patient Education  2018 [place] Maintenance, Female Adopting a healthy lifestyle and getting preventive care can go a long way to promote health and wellness. Talk with your health care provider about what schedule of regular examinations is right for you. This is a good chance for you to check in with your provider about disease prevention and staying healthy. In between checkups, there are plenty of things you can do on your own. Experts have done a lot of research about which lifestyle changes and preventive measures are most likely to keep you healthy. Ask your health care provider for more information. Weight and diet Eat a healthy diet  Be sure to include plenty of vegetables, fruits, low-fat dairy products, and lean protein.  Do not eat a lot of foods high in solid fats, added sugars, or salt.  Get regular exercise. This is one of the most important things you can do for your health. ? Most adults should exercise for at least 150 minutes each week. The exercise should increase your heart rate and make you sweat (moderate-intensity exercise). ? Most adults should also do strengthening exercises at least twice a week. This is in addition to the moderate-intensity exercise.  Maintain a healthy weight  Body mass index (BMI) is a measurement that can be used to identify possible weight problems. It estimates body fat based on height and weight. Your health care provider can help determine your BMI and help you achieve or maintain a healthy weight.  For females 73 years of  age and older: ? A BMI below 18.5 is considered underweight. ? A BMI of 18.5 to 24.9 is normal. ? A BMI of 25 to 29.9 is considered overweight. ? A BMI of 30 and above is considered obese.  Watch levels of cholesterol and blood lipids  You should start having your blood tested for lipids and cholesterol at 78 years of age, then have this test every 5 years.  You may need to have your cholesterol levels checked more often if: ? Your lipid or cholesterol levels are high. ? You are older than 78 years of age. ? You are at high risk for heart disease.  Cancer screening Lung Cancer  Lung cancer screening is recommended for adults 38-75 years old who are at high risk for lung cancer because of a history of smoking.  A yearly low-dose CT scan of the lungs is recommended for people who: ? Currently smoke. ? Have quit within the past 15 years. ? Have at least a 30-pack-year history of smoking. A pack year is smoking an average of one pack of cigarettes a day for 1 year.  Yearly screening should continue until it has been 15 years since you quit.  Yearly screening should stop if you develop a health problem that would prevent you from having lung cancer treatment.  Breast Cancer  Practice breast self-awareness. This means understanding how your breasts normally appear and feel.  It also means doing regular breast self-exams. Let your health care provider know about any changes, no matter how small.  If you are in your 20s or 30s, you should have a clinical breast exam (CBE) by a health care provider every 1-3 years as part of a regular health exam.  If you are 52 or older, have a CBE every year. Also consider having a breast X-ray (mammogram) every year.  If you have a family history of breast cancer, talk to your health  care provider about genetic screening.  If you are at high risk for breast cancer, talk to your health care provider about having an MRI and a mammogram every  year.  Breast cancer gene (BRCA) assessment is recommended for women who have family members with BRCA-related cancers. BRCA-related cancers include: ? Breast. ? Ovarian. ? Tubal. ? Peritoneal cancers.  Results of the assessment will determine the need for genetic counseling and BRCA1 and BRCA2 testing.  Cervical Cancer Your health care provider may recommend that you be screened regularly for cancer of the pelvic organs (ovaries, uterus, and vagina). This screening involves a pelvic examination, including checking for microscopic changes to the surface of your cervix (Pap test). You may be encouraged to have this screening done every 3 years, beginning at age 50.  For women ages 17-65, health care providers may recommend pelvic exams and Pap testing every 3 years, or they may recommend the Pap and pelvic exam, combined with testing for human papilloma virus (HPV), every 5 years. Some types of HPV increase your risk of cervical cancer. Testing for HPV may also be done on women of any age with unclear Pap test results.  Other health care providers may not recommend any screening for nonpregnant women who are considered low risk for pelvic cancer and who do not have symptoms. Ask your health care provider if a screening pelvic exam is right for you.  If you have had past treatment for cervical cancer or a condition that could lead to cancer, you need Pap tests and screening for cancer for at least 20 years after your treatment. If Pap tests have been discontinued, your risk factors (such as having a new sexual partner) need to be reassessed to determine if screening should resume. Some women have medical problems that increase the chance of getting cervical cancer. In these cases, your health care provider may recommend more frequent screening and Pap tests.  Colorectal Cancer  This type of cancer can be detected and often prevented.  Routine colorectal cancer screening usually begins at 78  years of age and continues through 78 years of age.  Your health care provider may recommend screening at an earlier age if you have risk factors for colon cancer.  Your health care provider may also recommend using home test kits to check for hidden blood in the stool.  A small camera at the end of a tube can be used to examine your colon directly (sigmoidoscopy or colonoscopy). This is done to check for the earliest forms of colorectal cancer.  Routine screening usually begins at age 37.  Direct examination of the colon should be repeated every 5-10 years through 78 years of age. However, you may need to be screened more often if early forms of precancerous polyps or small growths are found.  Skin Cancer  Check your skin from head to toe regularly.  Tell your health care provider about any new moles or changes in moles, especially if there is a change in a mole's shape or color.  Also tell your health care provider if you have a mole that is larger than the size of a pencil eraser.  Always use sunscreen. Apply sunscreen liberally and repeatedly throughout the day.  Protect yourself by wearing long sleeves, pants, a wide-brimmed hat, and sunglasses whenever you are outside.  Heart disease, diabetes, and high blood pressure  High blood pressure causes heart disease and increases the risk of stroke. High blood pressure is more likely to develop  in: ? People who have blood pressure in the high end of the normal range (130-139/85-89 mm Hg). ? People who are overweight or obese. ? People who are African American.  If you are 17-12 years of age, have your blood pressure checked every 3-5 years. If you are 31 years of age or older, have your blood pressure checked every year. You should have your blood pressure measured twice-once when you are at a hospital or clinic, and once when you are not at a hospital or clinic. Record the average of the two measurements. To check your blood pressure  when you are not at a hospital or clinic, you can use: ? An automated blood pressure machine at a pharmacy. ? A home blood pressure monitor.  If you are between 110 years and 73 years old, ask your health care provider if you should take aspirin to prevent strokes.  Have regular diabetes screenings. This involves taking a blood sample to check your fasting blood sugar level. ? If you are at a normal weight and have a low risk for diabetes, have this test once every three years after 78 years of age. ? If you are overweight and have a high risk for diabetes, consider being tested at a younger age or more often. Preventing infection Hepatitis B  If you have a higher risk for hepatitis B, you should be screened for this virus. You are considered at high risk for hepatitis B if: ? You were born in a country where hepatitis B is common. Ask your health care provider which countries are considered high risk. ? Your parents were born in a high-risk country, and you have not been immunized against hepatitis B (hepatitis B vaccine). ? You have HIV or AIDS. ? You use needles to inject street drugs. ? You live with someone who has hepatitis B. ? You have had sex with someone who has hepatitis B. ? You get hemodialysis treatment. ? You take certain medicines for conditions, including cancer, organ transplantation, and autoimmune conditions.  Hepatitis C  Blood testing is recommended for: ? Everyone born from [birth year] through 1966. ? Anyone with known risk factors for hepatitis C.  Sexually transmitted infections (STIs)  You should be screened for sexually transmitted infections (STIs) including gonorrhea and chlamydia if: ? You are sexually active and are younger than 78 years of age. ? You are older than 78 years of age and your health care provider tells you that you are at risk for this type of infection. ? Your sexual activity has changed since you were last screened and you are at an increased  risk for chlamydia or gonorrhea. Ask your health care provider if you are at risk.  If you do not have HIV, but are at risk, it may be recommended that you take a prescription medicine daily to prevent HIV infection. This is called pre-exposure prophylaxis (PrEP). You are considered at risk if: ? You are sexually active and do not regularly use condoms or know the HIV status of your partner(s). ? You take drugs by injection. ? You are sexually active with a partner who has HIV.  Talk with your health care provider about whether you are at high risk of being infected with HIV. If you choose to begin PrEP, you should first be tested for HIV. You should then be tested every 3 months for as long as you are taking PrEP. Pregnancy  If you are premenopausal and you may become pregnant, ask  your health care provider about preconception counseling.  If you may become pregnant, take 400 to 800 micrograms (mcg) of folic acid every day.  If you want to prevent pregnancy, talk to your health care provider about birth control (contraception). Osteoporosis and menopause  Osteoporosis is a disease in which the bones lose minerals and strength with aging. This can result in serious bone fractures. Your risk for osteoporosis can be identified using a bone density scan.  If you are 79 years of age or older, or if you are at risk for osteoporosis and fractures, ask your health care provider if you should be screened.  Ask your health care provider whether you should take a calcium or vitamin D supplement to lower your risk for osteoporosis.  Menopause may have certain physical symptoms and risks.  Hormone replacement therapy may reduce some of these symptoms and risks. Talk to your health care provider about whether hormone replacement therapy is right for you. Follow these instructions at home:  Schedule regular health, dental, and eye exams.  Stay current with your immunizations.  Do not use any  tobacco products including cigarettes, chewing tobacco, or electronic cigarettes.  If you are pregnant, do not drink alcohol.  If you are breastfeeding, limit how much and how often you drink alcohol.  Limit alcohol intake to no more than 1 drink per day for nonpregnant women. One drink equals 12 ounces of beer, 5 ounces of wine, or 1 ounces of hard liquor.  Do not use street drugs.  Do not share needles.  Ask your health care provider for help if you need support or information about quitting drugs.  Tell your health care provider if you often feel depressed.  Tell your health care provider if you have ever been abused or do not feel safe at home. This information is not intended to replace advice given to you by your health care provider. Make sure you discuss any questions you have with your health care provider. Document Released: 11/29/2010 Document Revised: 10/22/2015 Document Reviewed: 02/17/2015 Elsevier Interactive Patient Education  [employer].

## 2017-06-15 NOTE — Progress Notes (Signed)
Subjective:   Alicia Vasquez is a 77 y.o. female who presents for Medicare Annual (Subsequent) preventive examination.  Report health as compromised but doing better  Presents with 3 friends, one of whom she lives with. 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  Has had fx of hip on the left due to cancer and other complications. Under treatment and oncology care and doing well at present.    Last OV 04/2017  Diet HDL 32; trig 65;  BS have been elevated  Breakfast; orange; egg; toast, coffee Lunch; meat; 2 vegetables Supper light sandwich Eats well  Has apple or salad with it    BMI 34    Exercise S/p hip replacement Completed physical therapy Does the exercises at home. Moves legs up and down Able to use [facility] by herself Gets in and out of bed Starting to dress her self Starting to go to the bathroom independently Uses a walker at home  Handicapped accessible to meet her needs  Takes miralax 1/2 tsp to assist with BM   There are no preventive care reminders to display for this patient.  Hx of colon cancer in the family  Mammogram 08/2016 Bone density 08/2016  -2.2 ( endo and oncology following)    Hasn't flu vaccines;  Never had a colonoscopy declined       Objective:     Vitals: BP (!) 146/70   Pulse 83   Ht 5\' 4"  (1.626 m)   Wt 203 lb (92.1 kg)   SpO2 98%   BMI 34.84 kg/m   Body mass index is 34.84 kg/m.  Advanced Directives 06/15/2017 05/16/2017 04/26/2017 04/12/2017 01/31/2017 01/26/2017 01/15/2017  Does Patient Have a Medical Advance Directive? No No No No No No No  Does patient want to make changes to medical advance directive? - - - - - - -  Would patient like information on creating a medical advance directive? - - Yes (MAU/Ambulatory/Procedural Areas - Information given) Yes (MAU/Ambulatory/Procedural Areas - Information given) No - Patient declined No - Patient declined No -  Patient declined   Discussed her advanced Directive Does not have one but prefers her friends be her HCPOA. Discussed legal next of kin and recommended she review and complete.  Advanced Directive; Reviewed advanced directive and agreed to receipt of information and discussion.  Focused face to face x  20 minutes discussing HCPOA and Living will and reviewed all the questions in the [place] forms. The patient voices understanding of HCPOA; LW reviewed and information provided on each question. Educated on how to revoke this HCPOA or LW at any time.   Also  discussed life prolonging measures (given a few examples) and where she could choose to initiate or not;  the ability to given the HCPOA power to change her living will or not if she cannot speak for herself; as well as finalizing the will by 2 unrelated witnesses and notary.  Will call for questions and given information on [facility] pastoral department for further assistance.     Tobacco Social History   Tobacco Use  Smoking Status Never Smoker  Smokeless Tobacco Never Used     Counseling given: Yes   Clinical Intake:     Past Medical History:  Diagnosis Date  . Allergy   . Anxiety   . Arthritis   . Blood transfusion without reported diagnosis   . Breast cancer ([place]) 07/12/2013  Invasive Mammary Carcinoma  . DVT (deep vein thrombosis) in pregnancy ([place])   . Hypertension   . Hypothyroid   . Personal history of radiation therapy   . Pneumonia   . Radiation 11/21/13-01/07/14   Right Breast/Supraclavicular   Past Surgical History:  Procedure Laterality Date  . BREAST LUMPECTOMY WITH RADIOACTIVE SEED LOCALIZATION Right 09/09/2013   Procedure: BREAST LUMPECTOMY WITH RADIOACTIVE SEED LOCALIZATION WITH AXILLARY NODE EXCISION;  Surgeon: [name], MD;  Location: [place];  Service: General;  Laterality: Right;  . DENTAL SURGERY  04/19/2012   13 TEETH REMOVED  . DILATION AND CURETTAGE OF UTERUS      . ORIF PERIPROSTHETIC FRACTURE Left 01/31/2017   Procedure: REVISION and OPEN REDUCTION INTERNAL FIXATION (ORIF) PERIPROSTHETIC FRACTURE LEFT HIP;  Surgeon: Paralee Cancel, MD;  Location: [facility];  Service: Orthopedics;  Laterality: Left;  120 mins  . RE-EXCISION OF BREAST LUMPECTOMY Right 09/24/2013   Procedure: RE-EXCISION OF RIGHT BREAST LUMPECTOMY;  Surgeon: [name], MD;  Location: [place];  Service: General;  Laterality: Right;  . TOTAL HIP ARTHROPLASTY Left 01/16/2017   Procedure: TOTAL HIP ARTHROPLASTY POSTERIOR;  Surgeon: Paralee Cancel, MD;  Location: [facility];  Service: Orthopedics;  Laterality: Left;   Family History  Problem Relation Age of Onset  . Heart disease Brother   . Colon cancer Brother   . Prostate cancer Brother    Social History   Socioeconomic History  . Marital status: Widowed    Spouse name: Not on file  . Number of children: 1  . Years of education: Not on file  . Highest education level: Not on file  Social Needs  . Financial resource strain: Not on file  . Food insecurity - worry: Not on file  . Food insecurity - inability: Not on file  . Transportation needs - medical: Not on file  . Transportation needs - non-medical: Not on file  Occupational History  . Not on file  Tobacco Use  . Smoking status: Never Smoker  . Smokeless tobacco: Never Used  Substance and Sexual Activity  . Alcohol use: No    Alcohol/week: 0.0 oz  . Drug use: No  . Sexual activity: No  Other Topics Concern  . Not on file  Social History Narrative   Exercise: yard work.    Outpatient Encounter Medications as of 06/15/2017  Medication Sig  . Cholecalciferol (VITAMIN D3) 5000 UNITS TABS Take 5,000 Units by mouth daily.   Marland Kitchen HYDROcodone-acetaminophen (NORCO/VICODIN) 5-325 MG tablet Take 1 tablet by mouth every 6 (six) hours as needed for moderate pain.  Marland Kitchen levothyroxine (SYNTHROID) 125 MCG tablet Take 1 tablet (125 mcg total) by mouth daily before  breakfast. Dispense as written: Synthroid LAST REFILL  . polyethylene glycol (MIRALAX / GLYCOLAX) packet Take 17 g by mouth 2 (two) times daily.  Marland Kitchen warfarin (JANTOVEN) 5 MG tablet Take 1.5-2 tablets (7.5-10 mg total) by mouth one time only at 6 PM for 1 dose. Take 7.5 mg alternating with 10 mg po daily   No facility-administered encounter medications on file as of 06/15/2017.     Activities of Daily Living In your present state of health, do you have any difficulty performing the following activities: 06/15/2017 04/26/2017  Hearing? Y N  Comment referral to hearing ENT -  Vision? N N  Comment wears glasses -  Difficulty concentrating or making decisions? N N  Walking or climbing stairs? Y Y  Dressing or bathing? Y N  Doing  errands, shopping? Tempie Donning  Preparing Food and eating ? Y N  Using the Toilet? N N  In the past six months, have you accidently leaked urine? Y Y  Comment at times  -  Do you have problems with loss of bowel control? Y N  Comment takes miralax -  Managing your Medications? N N  Comment gets reminded to take vitamins -  Managing your Finances? N Y  Housekeeping or managing your Housekeeping? Tempie Donning  Some recent data might be hidden    Patient Care Team: Briscoe Deutscher, DO as PCP - General (Family Medicine) Philemon Kingdom, MD as Consulting Physician (Internal Medicine) Caprice Renshaw, MD as Referring Physician (Internal Medicine)    Assessment:   This is a routine wellness examination for [facility].  Exercise Activities and Dietary recommendations Current Exercise Habits: Home exercise routine, Exercise limited by: orthopedic condition(s)  Goals    . Patient Stated     Wants to walk again!  Keep working on your exercises and eating well        Fall Risk Fall Risk  06/15/2017 04/26/2017 04/12/2017 12/05/2016 11/11/2015  Falls in the past year? No Yes Yes No No  Number falls in past yr: - 1 1 - -  Injury with Fall? - Yes Yes - -  Risk Factor Category  - High Fall  Risk High Fall Risk - -  Risk for fall due to : - History of fall(s);Impaired balance/gait;Impaired mobility;Medication side effect History of fall(s);Impaired balance/gait;Medication side effect;Impaired mobility - -  Follow up - Education provided;Falls prevention discussed Falls evaluation completed - -   States she fell post 1st hip replacement and later fell in SNF after being assist to get up by PT. No falls since last surgery   Depression Screen PHQ 2/9 Scores 06/15/2017 04/26/2017 04/12/2017 12/05/2016  PHQ - 2 Score 0 1 1 0    Has had issues; states she is doing well  Cognitive Function     Ad8 score reviewed for issues:  Issues making decisions:  Less interest in hobbies / activities:  Repeats questions, stories (family complaining):  Trouble using ordinary gadgets (microwave, computer, phone):  Forgets the month or year:   Mismanaging finances:   Remembering appts:  Daily problems with thinking and/or memory: Ad8 score is=0 Is good but has help Still working on her brother's estate        Immunization History  Administered Date(s) Administered  . Pneumococcal-Unspecified 07/20/2009    Qualifies for Shingles Vaccine? educated  Screening Tests Health Maintenance  Topic Date Due  . INFLUENZA VACCINE  02/15/2018 (Originally 12/28/2016)  . TETANUS/TDAP  06/15/2018 (Originally 07/14/1959)  . PNA vac Low Risk Adult (2 of 2 - PCV13) 06/15/2018 (Originally 07/20/2010)  . DEXA SCAN  Completed         Plan:      PCP Notes   Health Maintenance Hx of multiple allergies and tx for breast cancer mets to left hip.  Declined flu vaccine; states she does not take the flu vaccine deferred to oncology Declined pneumonia as she did "not do well with the first one"  Discussed Tdap with pertussis and can take at the pharmacy but will postpone for now  Encouraged her to complete and AD    Abnormal Screens  Very HOH. States she was referred to ENT and recommended  she fup as directed. Given resources for a fee hearing aid with the dept of [facility].  Educated regarding the shingrix and will hold  for now.   Referrals  None today  Patient concerns; None, doing better   Nurse Concerns; As noted; Friend is taking very good care of her  Next PCP apt As needed - TBS Regular fup with Dr. Jana Hakim     I have personally reviewed and noted the following in the patient's chart:   . Medical and social history . Use of alcohol, tobacco or illicit drugs  . Current medications and supplements . Functional ability and status . Nutritional status . Physical activity . Advanced directives . List of other physicians . Hospitalizations, surgeries, and ER visits in previous 12 months . Vitals . Screenings to include cognitive, depression, and falls . Referrals and appointments  In addition, I have reviewed and discussed with patient certain preventive protocols, quality metrics, and best practice recommendations. A written personalized care plan for preventive services as well as general preventive health recommendations were provided to patient.     Wynetta Fines, RN  06/15/2017

## 2017-06-15 NOTE — Progress Notes (Signed)
I have personally reviewed the Medicare Annual Wellness Visit and agree with the assessment and plan.  Algis Greenhouse. Jerline Pain, MD 06/15/2017 5:27 PM

## 2017-07-10 NOTE — Progress Notes (Signed)
Alicia Vasquez  [phone] [phone]) [phone]     ID: Alicia Vasquez OB: 1941/05/13  MR#: [id number]  KNL#:[id number]  PCP: Briscoe Deutscher, DO GYN:   SU: [name] OTHER MD: Thea Silversmith, Hart Robinsons, Philemon Kingdom  CHIEF COMPLAINT: Estrogen receptor positive breast cancer  CURRENT TREATMENT:  Fulvestrant and Delton See   INTERVAL HISTORY: Alicia Vasquez is here today for follow up and treatment of her metastatic estrogen receptor positive breast cancer accompanied by her two friends. She continues on fulvestrant, with a dose due today. She notes that her last shot caused her to have an itchy knot at the site of injection. Other than that, she tolerates this well.  She also receives [place], with good tolerance.   REVIEW OF SYSTEMS: [place] reports that she has mild pain in her left leg. She notes that she takes about 4-6 hydrocodone pills per day. She denies having constipation currently. She notes that she was taking Miralax everyday, which was too much. She notes that she has a balanced diet. She denies having bleeding problems. She notes that her left should is sore very often, which she attributes to having a fall a few years ago. She denies unusual headaches, visual changes, nausea, vomiting, or dizziness. There has been no unusual cough, phlegm production, or pleurisy. This been no change in bowel or bladder habits. She denies unexplained fatigue or unexplained weight loss, bleeding, rash, or fever. A detailed review of systems was otherwise stable.    BREAST CANCER HISTORY: From doctor Alicia Vasquez's intake node 07/24/2013:  "77 y.o. female. Who presented with SOB and had a CT chest performed that revealed a right breast mass. Mammogram/ultrsound on 2/13 showed a mass in the 11 o'clock position in the right breast measuring 1.5 cm. Also noted was a right axillary LN measuring 1.6 cm. MRI not performed. Biopsy of mass and lymph done. Mass pathology [SAA  [id number], on 07/11/2013] invasive mammary carcinoma with mammary carcinoma in situ, grade I, ER+ 100%, PR+ 100% her2neu-, Ki-67 17%. Lymph node + for metastatic carcinoma."  [On 09/09/2013 the patient underwent right lumpectomy and sentinel lymph node sampling. This showed ([id number]) multifocal invasive ductal carcinoma, grade 1, the largest lesion measuring 1.8 cm, the second lesion 1.2 cm. One of 4 sentinel lymph nodes was positive, with extracapsular extension. Margins were positive. HER-2 was repeated and was again negative. Further surgery 09/16/2013 obtained clear margins.  Her subsequent history is as detailed below  PAST MEDICAL HISTORY: Past Medical History:  Diagnosis Date  . Allergy   . Anxiety   . Arthritis   . Blood transfusion without reported diagnosis   . Breast cancer (Alicia Vasquez) 07/12/2013   Invasive Mammary Carcinoma  . DVT (deep vein thrombosis) in pregnancy ([place])   . Hypertension   . Hypothyroid   . Personal history of radiation therapy   . Pneumonia   . Radiation 11/21/13-01/07/14   Right Breast/Supraclavicular    PAST SURGICAL HISTORY: Past Surgical History:  Procedure Laterality Date  . BREAST LUMPECTOMY WITH RADIOACTIVE SEED LOCALIZATION Right 09/09/2013   Procedure: BREAST LUMPECTOMY WITH RADIOACTIVE SEED LOCALIZATION WITH AXILLARY NODE EXCISION;  Surgeon: [name], MD;  Location: [facility];  Service: General;  Laterality: Right;  . DENTAL SURGERY  04/19/2012   13 TEETH REMOVED  . DILATION AND CURETTAGE OF UTERUS    . ORIF PERIPROSTHETIC FRACTURE Left 01/31/2017   Procedure: REVISION and OPEN REDUCTION INTERNAL FIXATION (ORIF) PERIPROSTHETIC FRACTURE LEFT HIP;  Surgeon: Paralee Cancel, MD;  Location: [facility];  Service: Orthopedics;  Laterality: Left;  120 mins  . RE-EXCISION OF BREAST LUMPECTOMY Right 09/24/2013   Procedure: RE-EXCISION OF RIGHT BREAST LUMPECTOMY;  Surgeon: [name], MD;  Location: [facility];  Service:  General;  Laterality: Right;  . TOTAL HIP ARTHROPLASTY Left 01/16/2017   Procedure: TOTAL HIP ARTHROPLASTY POSTERIOR;  Surgeon: Paralee Cancel, MD;  Location: [facility];  Service: Orthopedics;  Laterality: Left;    FAMILY HISTORY Family History  Problem Relation Age of Onset  . Heart disease Brother   . Colon cancer Brother   . Prostate cancer Brother    the patient's father died at the age of 38 after an automobile accident. The patient's mother died at the age of 13. She was a Marine scientist here in [name] in the old [facility]. She was infected with polio and was confined to a wheelchair for a good part of her life. She eventually died of pneumonia. The patient had one brother, who died with prostate cancer. She had no sisters. There is no history of breast or ovarian cancer in the family.  GYNECOLOGIC HISTORY:  Menarche age 63, first live birth age 24, the patient is GX P1. She went through the change of life at age 43. 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 She is widowed. Currently she is staying with her friend Alicia Vasquez,  46,who is a retired Radio producer. The patient's son Alicia Vasquez lives in [place]. He works an Engineer, technical sales. The patient has no grandchildren. She is a Tourist information centre manager but currently attends a [employer] with her friend Alicia Vasquez   ADVANCED DIRECTIVES: Not in place   HEALTH MAINTENANCE: Social History   Tobacco Use  . Smoking status: Never Smoker  . Smokeless tobacco: Never Used  Substance Use Topics  . Alcohol use: No    Alcohol/week: 0.0 oz  . Drug use: No     Colonoscopy: Never  PAP:  Bone density: 10/01/2009; lowest T score -0.8  Lipid panel:  Allergies  Allergen Reactions  . Anesthetics, [place]ide Hypertension  . Benadryl [Diphenhydramine Hcl] Other (See Comments)    Dizziness  . Carbocaine [Mepivacaine Hcl] Hypertension  . Codeine Other (See Comments)    Dizziness  .  Epinephrine Hypertension  . Sulfa Antibiotics Other (See Comments)    dizziness  . Latex Other (See Comments)    Blisters in mouth  . Tramadol     Sedation.   Marland Kitchen Penicillins Rash    Has patient had a PCN reaction causing immediate rash, facial/tongue/throat swelling, SOB or lightheadedness with hypotension: Unknown Has patient had a PCN reaction causing severe rash involving mucus membranes or skin necrosis: Unknown Has patient had a PCN reaction that required hospitalization: Unknown Has patient had a PCN reaction occurring within the last 10 years: Unknown If all of the above answers are "NO", then may proceed with Cephalosporin use.     Current Outpatient Medications  Medication Sig Dispense Refill  . Cholecalciferol (VITAMIN D3) 5000 UNITS TABS Take 5,000 Units by mouth daily.     Marland Kitchen HYDROcodone-acetaminophen (NORCO/VICODIN) 5-325 MG tablet Take 1 tablet by mouth every 6 (six) hours as needed for moderate pain. 120 tablet 0  . levothyroxine (SYNTHROID) 125 MCG tablet Take 1 tablet (125 mcg total) by mouth daily before breakfast. Dispense as written: Synthroid LAST REFILL 90 tablet 3  . polyethylene glycol (MIRALAX / GLYCOLAX) packet Take 17 g by  mouth 2 (two) times daily. 14 each 0  . warfarin (JANTOVEN) 5 MG tablet Take 1.5-2 tablets (7.5-10 mg total) by mouth one time only at 6 PM for 1 dose. Take 7.5 mg alternating with 10 mg po daily 90 tablet 4   No current facility-administered medications for this visit.     OBJECTIVE: Older white woman examined in a wheelchair  Vitals:   07/11/17 1242  BP: (!) 133/55  Pulse: 82  Resp: 18  Temp: 97.8 F (36.6 C)  SpO2: 96%     Body mass index is 34.84 kg/m.    ECOG FS:2  Sclerae unicteric, pupils round and equal No cervical or supraclavicular adenopathy Lungs no rales or rhonchi Heart regular rate and rhythm Abd soft, nontender, positive bowel sounds MSK no focal spinal tenderness, chronic bilateral grade 1 ankle edema without  erythema Neuro: nonfocal, well oriented, appropriate affect Breasts: I do not palpate a mass in either breast.  Both axillae are benign  LAB RESULTS:  CMP     Component Value Date/Time   NA 138 06/13/2017 1513   NA 141 05/16/2017 1416   K 4.2 06/13/2017 1513   K 3.8 05/16/2017 1416   CL 106 06/13/2017 1513   CO2 25 06/13/2017 1513   CO2 27 05/16/2017 1416   GLUCOSE 138 06/13/2017 1513   GLUCOSE 140 05/16/2017 1416   BUN 17 06/13/2017 1513   BUN 13.2 05/16/2017 1416   CREATININE 0.73 06/13/2017 1513   CREATININE 0.8 05/16/2017 1416   CALCIUM 10.0 06/13/2017 1513   CALCIUM 11.0 (H) 05/16/2017 1416   PROT 6.7 06/13/2017 1513   PROT 6.9 05/16/2017 1416   ALBUMIN 3.6 06/13/2017 1513   ALBUMIN 3.6 05/16/2017 1416   AST 11 06/13/2017 1513   AST 13 05/16/2017 1416   ALT 11 06/13/2017 1513   ALT <6 05/16/2017 1416   ALKPHOS 112 06/13/2017 1513   ALKPHOS 130 05/16/2017 1416   BILITOT 0.2 06/13/2017 1513   BILITOT 0.31 05/16/2017 1416   GFRNONAA >60 06/13/2017 1513   GFRNONAA 75 08/19/2015 1602   GFRAA >60 06/13/2017 1513   GFRAA 87 08/19/2015 1602    I No results found for: SPEP  Lab Results  Component Value Date   WBC 7.7 07/11/2017   NEUTROABS 5.2 07/11/2017   HGB 12.6 07/11/2017   HCT 40.1 07/11/2017   MCV 85.1 07/11/2017   PLT 257 07/11/2017      Chemistry      Component Value Date/Time   NA 138 06/13/2017 1513   NA 141 05/16/2017 1416   K 4.2 06/13/2017 1513   K 3.8 05/16/2017 1416   CL 106 06/13/2017 1513   CO2 25 06/13/2017 1513   CO2 27 05/16/2017 1416   BUN 17 06/13/2017 1513   BUN 13.2 05/16/2017 1416   CREATININE 0.73 06/13/2017 1513   CREATININE 0.8 05/16/2017 1416      Component Value Date/Time   CALCIUM 10.0 06/13/2017 1513   CALCIUM 11.0 (H) 05/16/2017 1416   ALKPHOS 112 06/13/2017 1513   ALKPHOS 130 05/16/2017 1416   AST 11 06/13/2017 1513   AST 13 05/16/2017 1416   ALT 11 06/13/2017 1513   ALT <6 05/16/2017 1416   BILITOT 0.2  06/13/2017 1513   BILITOT 0.31 05/16/2017 1416       No results found for: LABCA2  No components found for: LABCA125  Recent Labs  Lab 07/11/17 1222  INR 2.04    Urinalysis    Component Value Date/Time  COLORURINE STRAW (A) 01/15/2017 2000   APPEARANCEUR CLEAR 01/15/2017 2000   LABSPEC 1.008 01/15/2017 2000   LABSPEC 1.010 09/22/2016 1553   PHURINE 6.0 01/15/2017 2000   GLUCOSEU NEGATIVE 01/15/2017 2000   GLUCOSEU Negative 09/22/2016 1553   HGBUR SMALL (A) 01/15/2017 2000   BILIRUBINUR NEGATIVE 01/15/2017 2000   BILIRUBINUR Negative 01/06/2017 1608   BILIRUBINUR Negative 09/22/2016 1553   KETONESUR NEGATIVE 01/15/2017 2000   PROTEINUR NEGATIVE 01/15/2017 2000   UROBILINOGEN 0.2 01/06/2017 1608   UROBILINOGEN 0.2 11/15/2016 1358   UROBILINOGEN 0.2 09/22/2016 1553   NITRITE NEGATIVE 01/15/2017 2000   LEUKOCYTESUR NEGATIVE 01/15/2017 2000   LEUKOCYTESUR Negative 09/22/2016 1553    STUDIES: No results found.  ASSESSMENT: 77 y.o. [place] woman status post right breast upper outer quadrant lumpectomy and sentinel lymph node sampling 09/09/2013 for an mpT1c pN1a, stage IIA invasive ductal carcinoma, estrogen and progesterone receptor both 100% positive with strong staining intensity, MIB-1 of 17% and no HER-2 amplification  (1) additional surgery for margin clearance 09/16/2013 obtained negative margins  (2) Oncotype DX recurrence score of 4 predicts a risk of outside the breast recurrence within 10 years of 7% if the patient's only systemic therapy is tamoxifen for 5 years. It also predicts no benefit from chemotherapy  (3) adjuvant radiation completed 01/07/2014  (4) anastrozole started 02/27/2014 stopped within 2 weeks because of arm swelling.   (a) bone density April 2016 showed osteopenia, with a t-score of -1.6  (b) anastrozole resumed 12/17/2015  (c) Bone density 09/20/2016 showed a T score of -2.2  (5) history of left lower extremity DVT 11/23/2012,  initially on rivaroxaban, which caused chest pain, switch to Coumadin July 2014  (b) coumadin dose increased to 7.5/10 mg alternating days as of 06/13/2017   METASTATIC DISEASE: August 2018 (6) status post left total hip replacement 01/16/2017 for estrogen receptor positive adenocarcinoma.  (a) CA 27-29 was 46.4 as of 04/18/2017  (b) chest CT scan 05/02/2017 shows no lung or liver lesion concern; it does show aortic atherosclerosis  (c) baseline bone scan 05/02/2017 was negative  (7) fulvestrant started 04/18/2017  (8) denosumab/Xgeva started 05/16/2017  PLAN:  Itzayana looks clinically better every time I see her.  She is tolerating her treatments moderately well.  It is a concern that she is on blood thinner and receiving IM shots but we do do this, again with care.  If necessary we will switch her to a different antiestrogen.  However I think it is very favorable that when we obtained a CT of the chest and a bone scan we did not see any other lesion  I think were going to need a PET scan to appropriately follow her.  I have scheduled this for early April before she returns to see me.  Otherwise we are continuing on the fulvestrant and Xgeva every 4 weeks.  They understand that although she did have high calcium levels earlier she is now much more likely to have low calcium and does need to continue to supplement.  Her INR today is pending.  I will review it and call her if her warfarin needs to be adjusted  They know to call for any other issues that may develop before the next visit.     Magrinat, [name], MD  07/11/17 1:12 PM Medical Oncology and Hematology [facility] [address] [place], [place] [address] Tel. [phone]    Fax. [phone]  This document serves as a record of services personally performed  by Lurline Del, MD. It was created on his behalf by Sheron Nightingale, a trained medical scribe. The creation of this record is based on the scribe's  personal observations and the provider's statements to them.   I have reviewed the above documentation for accuracy and completeness, and I agree with the above.

## 2017-07-11 ENCOUNTER — Inpatient Hospital Stay: Payer: Medicare Other | Attending: Oncology

## 2017-07-11 ENCOUNTER — Telehealth: Payer: Self-pay | Admitting: Oncology

## 2017-07-11 ENCOUNTER — Inpatient Hospital Stay (HOSPITAL_BASED_OUTPATIENT_CLINIC_OR_DEPARTMENT_OTHER): Payer: Medicare Other | Admitting: Oncology

## 2017-07-11 ENCOUNTER — Inpatient Hospital Stay: Payer: Medicare Other

## 2017-07-11 VITALS — BP 133/55 | HR 82 | Temp 97.8°F | Resp 18 | Ht 64.0 in

## 2017-07-11 DIAGNOSIS — Z79899 Other long term (current) drug therapy: Secondary | ICD-10-CM | POA: Diagnosis not present

## 2017-07-11 DIAGNOSIS — Z7901 Long term (current) use of anticoagulants: Secondary | ICD-10-CM | POA: Insufficient documentation

## 2017-07-11 DIAGNOSIS — E039 Hypothyroidism, unspecified: Secondary | ICD-10-CM | POA: Insufficient documentation

## 2017-07-11 DIAGNOSIS — Z86711 Personal history of pulmonary embolism: Secondary | ICD-10-CM

## 2017-07-11 DIAGNOSIS — Z9181 History of falling: Secondary | ICD-10-CM | POA: Insufficient documentation

## 2017-07-11 DIAGNOSIS — M84459A Pathological fracture, hip, unspecified, initial encounter for fracture: Secondary | ICD-10-CM

## 2017-07-11 DIAGNOSIS — I10 Essential (primary) hypertension: Secondary | ICD-10-CM | POA: Insufficient documentation

## 2017-07-11 DIAGNOSIS — M1990 Unspecified osteoarthritis, unspecified site: Secondary | ICD-10-CM

## 2017-07-11 DIAGNOSIS — Z170 Estrogen receptor positive status [ER+]: Secondary | ICD-10-CM | POA: Diagnosis not present

## 2017-07-11 DIAGNOSIS — Z86718 Personal history of other venous thrombosis and embolism: Secondary | ICD-10-CM | POA: Insufficient documentation

## 2017-07-11 DIAGNOSIS — Z96642 Presence of left artificial hip joint: Secondary | ICD-10-CM | POA: Diagnosis not present

## 2017-07-11 DIAGNOSIS — C50411 Malignant neoplasm of upper-outer quadrant of right female breast: Secondary | ICD-10-CM

## 2017-07-11 DIAGNOSIS — C7951 Secondary malignant neoplasm of bone: Secondary | ICD-10-CM

## 2017-07-11 DIAGNOSIS — M79662 Pain in left lower leg: Secondary | ICD-10-CM | POA: Insufficient documentation

## 2017-07-11 DIAGNOSIS — Z882 Allergy status to sulfonamides status: Secondary | ICD-10-CM | POA: Insufficient documentation

## 2017-07-11 DIAGNOSIS — F418 Other specified anxiety disorders: Secondary | ICD-10-CM | POA: Insufficient documentation

## 2017-07-11 DIAGNOSIS — D6859 Other primary thrombophilia: Secondary | ICD-10-CM

## 2017-07-11 DIAGNOSIS — Z888 Allergy status to other drugs, medicaments and biological substances status: Secondary | ICD-10-CM | POA: Diagnosis not present

## 2017-07-11 DIAGNOSIS — I700 Atherosclerosis of aorta: Secondary | ICD-10-CM

## 2017-07-11 DIAGNOSIS — I82401 Acute embolism and thrombosis of unspecified deep veins of right lower extremity: Secondary | ICD-10-CM

## 2017-07-11 DIAGNOSIS — Z79818 Long term (current) use of other agents affecting estrogen receptors and estrogen levels: Secondary | ICD-10-CM | POA: Insufficient documentation

## 2017-07-11 DIAGNOSIS — M899 Disorder of bone, unspecified: Secondary | ICD-10-CM

## 2017-07-11 DIAGNOSIS — M84559P Pathological fracture in neoplastic disease, hip, unspecified, subsequent encounter for fracture with malunion: Secondary | ICD-10-CM

## 2017-07-11 DIAGNOSIS — G893 Neoplasm related pain (acute) (chronic): Secondary | ICD-10-CM

## 2017-07-11 DIAGNOSIS — M84659P Pathological fracture in other disease, hip, unspecified, subsequent encounter for fracture with malunion: Secondary | ICD-10-CM

## 2017-07-11 DIAGNOSIS — M8580 Other specified disorders of bone density and structure, unspecified site: Secondary | ICD-10-CM

## 2017-07-11 LAB — CBC WITH DIFFERENTIAL/PLATELET
Basophils Absolute: 0.1 10*3/uL (ref 0.0–0.1)
Basophils Relative: 1 %
Eosinophils Absolute: 0.2 10*3/uL (ref 0.0–0.5)
Eosinophils Relative: 3 %
HEMATOCRIT: 40.1 % (ref 34.8–46.6)
HEMOGLOBIN: 12.6 g/dL (ref 11.6–15.9)
Lymphocytes Relative: 15 %
Lymphs Abs: 1.1 10*3/uL (ref 0.9–3.3)
MCH: 26.8 pg (ref 25.1–34.0)
MCHC: 31.4 g/dL — AB (ref 31.5–36.0)
MCV: 85.1 fL (ref 79.5–101.0)
MONOS PCT: 15 %
Monocytes Absolute: 1.1 10*3/uL — ABNORMAL HIGH (ref 0.1–0.9)
NEUTROS PCT: 66 %
Neutro Abs: 5.2 10*3/uL (ref 1.5–6.5)
Platelets: 257 10*3/uL (ref 145–400)
RBC: 4.71 MIL/uL (ref 3.70–5.45)
RDW: 17.2 % — ABNORMAL HIGH (ref 11.2–14.5)
WBC: 7.7 10*3/uL (ref 3.9–10.3)

## 2017-07-11 LAB — COMPREHENSIVE METABOLIC PANEL
ALBUMIN: 3.6 g/dL (ref 3.5–5.0)
ALT: 11 U/L (ref 0–55)
AST: 13 U/L (ref 5–34)
Alkaline Phosphatase: 101 U/L (ref 40–150)
Anion gap: 9 (ref 3–11)
BUN: 13 mg/dL (ref 7–26)
CHLORIDE: 105 mmol/L (ref 98–109)
CO2: 26 mmol/L (ref 22–29)
CREATININE: 0.71 mg/dL (ref 0.60–1.10)
Calcium: 10.1 mg/dL (ref 8.4–10.4)
GFR calc non Af Amer: >60 mL/min (ref >60)
Glucose, Bld: 94 mg/dL (ref 70–140)
Potassium: 4.5 mmol/L (ref 3.5–5.1)
SODIUM: 140 mmol/L (ref 136–145)
Total Bilirubin: 0.3 mg/dL (ref 0.2–1.2)
Total Protein: 7 g/dL (ref 6.4–8.3)

## 2017-07-11 LAB — PROTIME-INR
INR: 2.04
Prothrombin Time: 22.9 seconds — ABNORMAL HIGH (ref 11.4–15.2)

## 2017-07-11 MED ORDER — LEVOTHYROXINE SODIUM 125 MCG PO TABS: 125.0000 ug | ORAL_TABLET | Freq: Every day | ORAL | 3 refills | Status: DC

## 2017-07-11 MED ORDER — DENOSUMAB 120 MG/1.7ML ~~LOC~~ SOLN
SUBCUTANEOUS | Status: AC
  Filled 2017-07-11: qty 1.7

## 2017-07-11 MED ORDER — WARFARIN SODIUM 5 MG PO TABS: 7.5000 mg | ORAL_TABLET | Freq: Once | ORAL | 4 refills | Status: DC

## 2017-07-11 MED ORDER — FULVESTRANT 250 MG/5ML IM SOLN
INTRAMUSCULAR | Status: AC
  Filled 2017-07-11: qty 10

## 2017-07-11 MED ORDER — FULVESTRANT 250 MG/5ML IM SOLN
500.0000 mg | Freq: Once | INTRAMUSCULAR | Status: AC
  Administered 2017-07-11: 500 mg via INTRAMUSCULAR

## 2017-07-11 MED ORDER — HYDROCODONE-ACETAMINOPHEN 5-325 MG PO TABS: 1.0000 | ORAL_TABLET | Freq: Four times a day (QID) | ORAL | 0 refills | Status: DC | PRN

## 2017-07-11 MED ORDER — DENOSUMAB 120 MG/1.7ML ~~LOC~~ SOLN
120.0000 mg | Freq: Once | SUBCUTANEOUS | Status: AC
  Administered 2017-07-11: 120 mg via SUBCUTANEOUS

## 2017-07-11 NOTE — Patient Instructions (Signed)
Denosumab injection What is this medicine? DENOSUMAB (den oh sue mab) slows bone breakdown. Prolia is used to treat osteoporosis in women after menopause and in men. Delton See is used to treat a high calcium level due to cancer and to prevent bone fractures and other bone problems caused by multiple myeloma or cancer bone metastases. Delton See is also used to treat giant cell tumor of the bone. This medicine may be used for other purposes; ask your health care provider or pharmacist if you have questions. COMMON BRAND NAME(S): Prolia, XGEVA What should I tell my health care provider before I take this medicine? They need to know if you have any of these conditions: -dental disease -having surgery or tooth extraction -infection -kidney disease -low levels of calcium or Vitamin D in the blood -malnutrition -on hemodialysis -skin conditions or sensitivity -thyroid or parathyroid disease -an unusual reaction to denosumab, other medicines, foods, dyes, or preservatives -pregnant or trying to get pregnant -breast-feeding How should I use this medicine? This medicine is for injection under the skin. It is given by a health care professional in a hospital or clinic setting. If you are getting Prolia, a special MedGuide will be given to you by the pharmacist with each prescription and refill. Be sure to read this information carefully each time. For Prolia, talk to your pediatrician regarding the use of this medicine in children. Special care may be needed. For Delton See, talk to your pediatrician regarding the use of this medicine in children. While this drug may be prescribed for children as young as 13 years for selected conditions, precautions do apply. Overdosage: If you think you have taken too much of this medicine contact a poison control center or emergency room at once. NOTE: This medicine is only for you. Do not share this medicine with others. What if I miss a dose? It is important not to miss your  dose. Call your doctor or health care professional if you are unable to keep an appointment. What may interact with this medicine? Do not take this medicine with any of the following medications: -other medicines containing denosumab This medicine may also interact with the following medications: -medicines that lower your chance of fighting infection -steroid medicines like prednisone or cortisone This list may not describe all possible interactions. Give your health care provider a list of all the medicines, herbs, non-prescription drugs, or dietary supplements you use. Also tell them if you smoke, drink alcohol, or use illegal drugs. Some items may interact with your medicine. What should I watch for while using this medicine? Visit your doctor or health care professional for regular checks on your progress. Your doctor or health care professional may order blood tests and other tests to see how you are doing. Call your doctor or health care professional for advice if you get a fever, chills or sore throat, or other symptoms of a cold or flu. Do not treat yourself. This drug may decrease your body's ability to fight infection. Try to avoid being around people who are sick. You should make sure you get enough calcium and vitamin D while you are taking this medicine, unless your doctor tells you not to. Discuss the foods you eat and the vitamins you take with your health care professional. See your dentist regularly. Brush and floss your teeth as directed. Before you have any dental work done, tell your dentist you are receiving this medicine. Do not become pregnant while taking this medicine or for 5 months after stopping  it. Talk with your doctor or health care professional about your birth control options while taking this medicine. Women should inform their doctor if they wish to become pregnant or think they might be pregnant. There is a potential for serious side effects to an unborn child. Talk  to your health care professional or pharmacist for more information. What side effects may I notice from receiving this medicine? Side effects that you should report to your doctor or health care professional as soon as possible: -allergic reactions like skin rash, itching or hives, swelling of the face, lips, or tongue -bone pain -breathing problems -dizziness -jaw pain, especially after dental work -redness, blistering, peeling of the skin -signs and symptoms of infection like fever or chills; cough; sore throat; pain or trouble passing urine -signs of low calcium like fast heartbeat, muscle cramps or muscle pain; pain, tingling, numbness in the hands or feet; seizures -unusual bleeding or bruising -unusually weak or tired Side effects that usually do not require medical attention (report to your doctor or health care professional if they continue or are bothersome): -constipation -diarrhea -headache -joint pain -loss of appetite -muscle pain -runny nose -tiredness -upset stomach This list may not describe all possible side effects. Call your doctor for medical advice about side effects. You may report side effects to FDA at 1-800-FDA-1088. Where should I keep my medicine? This medicine is only given in a clinic, doctor's office, or other health care setting and will not be stored at home. NOTE: This sheet is a summary. It may not cover all possible information. If you have questions about this medicine, talk to your doctor, pharmacist, or health care provider.  2018 Elsevier/Gold Standard (2016-06-07 19:17:21)   Fulvestrant injection What is this medicine? FULVESTRANT (ful VES trant) blocks the effects of estrogen. It is used to treat breast cancer. This medicine may be used for other purposes; ask your health care provider or pharmacist if you have questions. COMMON BRAND NAME(S): FASLODEX What should I tell my health care provider before I take this medicine? They need to  know if you have any of these conditions: -bleeding problems -liver disease -low levels of platelets in the blood -an unusual or allergic reaction to fulvestrant, other medicines, foods, dyes, or preservatives -pregnant or trying to get pregnant -breast-feeding How should I use this medicine? This medicine is for injection into a muscle. It is usually given by a health care professional in a hospital or clinic setting. Talk to your pediatrician regarding the use of this medicine in children. Special care may be needed. Overdosage: If you think you have taken too much of this medicine contact a poison control center or emergency room at once. NOTE: This medicine is only for you. Do not share this medicine with others. What if I miss a dose? It is important not to miss your dose. Call your doctor or health care professional if you are unable to keep an appointment. What may interact with this medicine? -medicines that treat or prevent blood clots like warfarin, enoxaparin, and dalteparin This list may not describe all possible interactions. Give your health care provider a list of all the medicines, herbs, non-prescription drugs, or dietary supplements you use. Also tell them if you smoke, drink alcohol, or use illegal drugs. Some items may interact with your medicine. What should I watch for while using this medicine? Your condition will be monitored carefully while you are receiving this medicine. You will need important blood work done while you   this medicine. Do not become pregnant while taking this medicine or for at least 1 year after stopping it. Women of child-bearing potential will need to have a negative pregnancy test before starting this medicine. Women should inform their doctor if they wish to become pregnant or think they might be pregnant. There is a potential for serious side effects to an unborn child. Men should inform their doctors if they wish to father a child. This  medicine may lower sperm counts. Talk to your health care professional or pharmacist for more information. Do not breast-feed an infant while taking this medicine or for 1 year after the last dose. What side effects may I notice from receiving this medicine? Side effects that you should report to your doctor or health care professional as soon as possible: -allergic reactions like skin rash, itching or hives, swelling of the face, lips, or tongue -feeling faint or lightheaded, falls -pain, tingling, numbness, or weakness in the legs -signs and symptoms of infection like fever or chills; cough; flu-like symptoms; sore throat -vaginal bleeding Side effects that usually do not require medical attention (report to your doctor or health care professional if they continue or are bothersome): -aches, pains -constipation -diarrhea -headache -hot flashes -nausea, vomiting -pain at site where injected -stomach pain This list may not describe all possible side effects. Call your doctor for medical advice about side effects. You may report side effects to FDA at 1-800-FDA-1088. Where should I keep my medicine? This drug is given in a hospital or clinic and will not be stored at home. NOTE: This sheet is a summary. It may not cover all possible information. If you have questions about this medicine, talk to your doctor, pharmacist, or health care provider.  2018 Elsevier/Gold Standard (2014-12-12 11:03:55)

## 2017-07-11 NOTE — Telephone Encounter (Signed)
Gave patient AVS and calendar of upcoming February and March appointments.  °

## 2017-07-12 LAB — CANCER ANTIGEN 27.29: CAN 27.29: 40.3 U/mL — AB (ref 0.0–38.6)

## 2017-08-08 ENCOUNTER — Ambulatory Visit: Payer: Medicare Other | Admitting: Oncology

## 2017-08-08 ENCOUNTER — Inpatient Hospital Stay: Payer: Medicare Other

## 2017-08-08 ENCOUNTER — Inpatient Hospital Stay: Payer: Medicare Other | Attending: Oncology

## 2017-08-08 VITALS — BP 138/52 | HR 78 | Temp 98.0°F | Resp 18

## 2017-08-08 DIAGNOSIS — Z7951 Long term (current) use of inhaled steroids: Secondary | ICD-10-CM | POA: Diagnosis not present

## 2017-08-08 DIAGNOSIS — M84459A Pathological fracture, hip, unspecified, initial encounter for fracture: Secondary | ICD-10-CM

## 2017-08-08 DIAGNOSIS — C50411 Malignant neoplasm of upper-outer quadrant of right female breast: Secondary | ICD-10-CM

## 2017-08-08 DIAGNOSIS — Z170 Estrogen receptor positive status [ER+]: Secondary | ICD-10-CM | POA: Insufficient documentation

## 2017-08-08 DIAGNOSIS — M899 Disorder of bone, unspecified: Secondary | ICD-10-CM

## 2017-08-08 DIAGNOSIS — Z79818 Long term (current) use of other agents affecting estrogen receptors and estrogen levels: Secondary | ICD-10-CM | POA: Diagnosis not present

## 2017-08-08 DIAGNOSIS — C7951 Secondary malignant neoplasm of bone: Secondary | ICD-10-CM

## 2017-08-08 DIAGNOSIS — I82401 Acute embolism and thrombosis of unspecified deep veins of right lower extremity: Secondary | ICD-10-CM

## 2017-08-08 DIAGNOSIS — I700 Atherosclerosis of aorta: Secondary | ICD-10-CM

## 2017-08-08 DIAGNOSIS — M8580 Other specified disorders of bone density and structure, unspecified site: Secondary | ICD-10-CM

## 2017-08-08 DIAGNOSIS — D6859 Other primary thrombophilia: Secondary | ICD-10-CM

## 2017-08-08 DIAGNOSIS — G893 Neoplasm related pain (acute) (chronic): Secondary | ICD-10-CM

## 2017-08-08 DIAGNOSIS — M84659P Pathological fracture in other disease, hip, unspecified, subsequent encounter for fracture with malunion: Secondary | ICD-10-CM

## 2017-08-08 LAB — COMPREHENSIVE METABOLIC PANEL
ALBUMIN: 3.7 g/dL (ref 3.5–5.0)
ALK PHOS: 95 U/L (ref 40–150)
ALT: 14 U/L (ref 0–55)
ANION GAP: 9 (ref 3–11)
AST: 13 U/L (ref 5–34)
BILIRUBIN TOTAL: 0.3 mg/dL (ref 0.2–1.2)
BUN: 14 mg/dL (ref 7–26)
CALCIUM: 10.6 mg/dL — AB (ref 8.4–10.4)
CO2: 27 mmol/L (ref 22–29)
Chloride: 105 mmol/L (ref 98–109)
Creatinine, Ser: 0.7 mg/dL (ref 0.60–1.10)
GFR calc Af Amer: >60 mL/min (ref >60)
GFR calc non Af Amer: >60 mL/min (ref >60)
GLUCOSE: 100 mg/dL (ref 70–140)
Potassium: 4.1 mmol/L (ref 3.5–5.1)
Sodium: 141 mmol/L (ref 136–145)
TOTAL PROTEIN: 7.2 g/dL (ref 6.4–8.3)

## 2017-08-08 LAB — CBC WITH DIFFERENTIAL/PLATELET
BASOS PCT: 1 %
Basophils Absolute: 0.1 10*3/uL (ref 0.0–0.1)
Eosinophils Absolute: 0.2 10*3/uL (ref 0.0–0.5)
Eosinophils Relative: 3 %
HEMATOCRIT: 40.1 % (ref 34.8–46.6)
HEMOGLOBIN: 12.9 g/dL (ref 11.6–15.9)
LYMPHS ABS: 1.1 10*3/uL (ref 0.9–3.3)
LYMPHS PCT: 15 %
MCH: 26.7 pg (ref 25.1–34.0)
MCHC: 32.2 g/dL (ref 31.5–36.0)
MCV: 83.1 fL (ref 79.5–101.0)
MONOS PCT: 12 %
Monocytes Absolute: 0.8 10*3/uL (ref 0.1–0.9)
NEUTROS ABS: 4.7 10*3/uL (ref 1.5–6.5)
Neutrophils Relative %: 69 %
Platelets: 265 10*3/uL (ref 145–400)
RBC: 4.83 MIL/uL (ref 3.70–5.45)
RDW: 17.4 % — ABNORMAL HIGH (ref 11.2–14.5)
WBC: 6.9 10*3/uL (ref 3.9–10.3)

## 2017-08-08 LAB — PROTIME-INR
INR: 2.13
Prothrombin Time: 23.7 seconds — ABNORMAL HIGH (ref 11.4–15.2)

## 2017-08-08 MED ORDER — DENOSUMAB 120 MG/1.7ML ~~LOC~~ SOLN
120.0000 mg | Freq: Once | SUBCUTANEOUS | Status: AC
  Administered 2017-08-08: 120 mg via SUBCUTANEOUS

## 2017-08-08 MED ORDER — FULVESTRANT 250 MG/5ML IM SOLN
INTRAMUSCULAR | Status: AC
  Filled 2017-08-08: qty 10

## 2017-08-08 MED ORDER — FULVESTRANT 250 MG/5ML IM SOLN
500.0000 mg | Freq: Once | INTRAMUSCULAR | Status: AC
  Administered 2017-08-08: 500 mg via INTRAMUSCULAR

## 2017-08-09 LAB — CANCER ANTIGEN 27.29: CA 27.29: 52.6 U/mL — ABNORMAL HIGH (ref 0.0–38.6)

## 2017-08-30 NOTE — Progress Notes (Signed)
[place]  [phone] Fax:([phone]     ID: Alicia Vasquez OB: Apr 12, 1941  MR#: [id number]  HEN#:[id number]  PCP: Briscoe Deutscher, DO GYN:   SU: [name] OTHER MD: Thea Silversmith, Hart Robinsons, Philemon Kingdom  CHIEF COMPLAINT: Estrogen receptor positive breast cancer  CURRENT TREATMENT:  Fulvestrant and denosumab/Xgeva   INTERVAL HISTORY: Alicia Vasquez is here today for follow up and treatment of her metastatic estrogen receptor positive breast cancer accompanied by her 2 friends. She receives fulvestrant every 4 weeks, with a dose due today.   She is concerned about possible co-pays for the fulvestrant.  She will discuss this with [facility] billing.   She has some sinus headaches. She denies nausea or vomiting and there have been no visual changes.  She does describe herself as a little bit dizzy in the morning when she gets up.  She also receives monthly denosumab/Xgeva. She tolerates this well.    REVIEW OF SYSTEMS: Alicia Vasquez reports that she is able to do more activities. She is using her walker and she is walking more often. She is having some sinus issues and pain in her left ear. She says she gets dizzy when she gets up at night. She denies having falls. She has a little bruising from hitting her hand. She denies unusual headaches, visual changes, nausea, vomiting, or dizziness. There has been no unusual cough, phlegm production, or pleurisy. This been no change in bowel or bladder habits. She denies unexplained fatigue or unexplained weight loss, bleeding, rash, or fever. A detailed review of systems was otherwise stable.    BREAST CANCER HISTORY: From doctor Alicia Vasquez's intake node 07/24/2013:  "77 y.o. female. Who presented with SOB and had a CT chest performed that revealed a right breast mass. Mammogram/ultrsound on 2/13 showed a mass in the 11 o'clock position in the right breast measuring 1.5 cm. Also noted was a right axillary LN  measuring 1.6 cm. MRI not performed. Biopsy of mass and lymph done. 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 Lymph node + for metastatic carcinoma."  [On 09/09/2013 the patient underwent right lumpectomy and sentinel lymph node sampling. This showed ([id number]) multifocal invasive ductal carcinoma, grade 1, the largest lesion measuring 1.8 cm, the second lesion 1.2 cm. One of 4 sentinel lymph nodes was positive, with extracapsular extension. Margins were positive. HER-2 was repeated and was again negative. Further surgery 09/16/2013 obtained clear margins.  Her subsequent history is as detailed below  PAST MEDICAL HISTORY: Past Medical History:  Diagnosis Date  . Allergy   . Anxiety   . Arthritis   . Blood transfusion without reported diagnosis   . Breast cancer ([place]) 07/12/2013   Invasive Mammary Carcinoma  . DVT (deep vein thrombosis) in pregnancy ([place])   . Hypertension   . Hypothyroid   . Personal history of radiation therapy   . Pneumonia   . Radiation 11/21/13-01/07/14   Right Breast/Supraclavicular    PAST SURGICAL HISTORY: Past Surgical History:  Procedure Laterality Date  . BREAST LUMPECTOMY WITH RADIOACTIVE SEED LOCALIZATION Right 09/09/2013   Procedure: BREAST LUMPECTOMY WITH RADIOACTIVE SEED LOCALIZATION WITH AXILLARY NODE EXCISION;  Surgeon: [name], MD;  Location: [place];  Service: General;  Laterality: Right;  . DENTAL SURGERY  04/19/2012   13 TEETH REMOVED  . DILATION AND CURETTAGE OF UTERUS    . ORIF PERIPROSTHETIC FRACTURE Left  01/31/2017   Procedure: REVISION and OPEN REDUCTION INTERNAL FIXATION (ORIF) PERIPROSTHETIC FRACTURE LEFT HIP;  Surgeon: Paralee Cancel, MD;  Location: [facility];  Service: Orthopedics;  Laterality: Left;  120 mins  . RE-EXCISION OF BREAST LUMPECTOMY Right 09/24/2013   Procedure: RE-EXCISION OF RIGHT BREAST  LUMPECTOMY;  Surgeon: [name], MD;  Location: [place];  Service: General;  Laterality: Right;  . TOTAL HIP ARTHROPLASTY Left 01/16/2017   Procedure: TOTAL HIP ARTHROPLASTY POSTERIOR;  Surgeon: Paralee Cancel, MD;  Location: [facility];  Service: Orthopedics;  Laterality: Left;    FAMILY HISTORY Family History  Problem Relation Age of Onset  . Heart disease Brother   . Colon cancer Brother   . Prostate cancer Brother    the patient's father died at the age of 49 after an automobile accident. The patient's mother died at the age of 35. She was a Marine scientist here in [name] in the old [facility]. She was infected with polio and was confined to a wheelchair for a good part of her life. She eventually died of pneumonia. The patient had one brother, who died with prostate cancer. She had no sisters. There is no history of breast or ovarian cancer in the family.  GYNECOLOGIC HISTORY:  Menarche age 12, first live birth age 50, the patient is GX P1. She went through the change of life at age 46. 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 She is widowed. Currently she is staying with her friend Alicia Vasquez,  33,who is a retired Radio producer. The patient's son Alicia Vasquez lives in [place]. He works an Engineer, technical sales. The patient has no grandchildren. She is a Tourist information centre manager but currently attends a [employer] with her friend Alicia Vasquez   ADVANCED DIRECTIVES: Not in place   HEALTH MAINTENANCE: Social History   Tobacco Use  . Smoking status: Never Smoker  . Smokeless tobacco: Never Used  Substance Use Topics  . Alcohol use: No    Alcohol/week: 0.0 oz  . Drug use: No     Colonoscopy: Never  PAP:  Bone density: 10/01/2009; lowest T score -0.8  Lipid panel:  Allergies  Allergen Reactions  . Anesthetics, Amide Hypertension  . Benadryl [Diphenhydramine Hcl] Other (See Comments)    Dizziness  .  Carbocaine [Mepivacaine Hcl] Hypertension  . Codeine Other (See Comments)    Dizziness  . Epinephrine Hypertension  . Sulfa Antibiotics Other (See Comments)    dizziness  . Latex Other (See Comments)    Blisters in mouth  . Tramadol     Sedation.   Marland Kitchen Penicillins Rash    Has patient had a PCN reaction causing immediate rash, facial/tongue/throat swelling, SOB or lightheadedness with hypotension: Unknown Has patient had a PCN reaction causing severe rash involving mucus membranes or skin necrosis: Unknown Has patient had a PCN reaction that required hospitalization: Unknown Has patient had a PCN reaction occurring within the last 10 years: Unknown If all of the above answers are "NO", then may proceed with Cephalosporin use.     Current Outpatient Medications  Medication Sig Dispense Refill  . Cholecalciferol (VITAMIN D3) 5000 UNITS TABS Take 5,000 Units by mouth daily.     Marland Kitchen HYDROcodone-acetaminophen (NORCO/VICODIN) 5-325 MG tablet Take 1 tablet by mouth every 6 (six) hours as needed for moderate pain. 120 tablet 0  . levothyroxine (SYNTHROID) 125 MCG tablet Take 1 tablet (125 mcg total) by mouth daily before breakfast.  Dispense as written: Synthroid LAST REFILL 90 tablet 3  . polyethylene glycol (MIRALAX / GLYCOLAX) packet Take 17 g by mouth 2 (two) times daily. 14 each 0  . vitamin C (ASCORBIC ACID) 250 MG tablet Take 250 mg by mouth 3 (three) times daily.    Marland Kitchen warfarin (JANTOVEN) 5 MG tablet Take 1.5-2 tablets (7.5-10 mg total) by mouth one time only at 6 PM for 1 dose. Take 7.5 mg alternating with 10 mg po daily 90 tablet 4   No current facility-administered medications for this visit.     OBJECTIVE: Older white woman examined in a wheelchair  Vitals:   09/05/17 1044  BP: (!) 124/55  Pulse: 77  Resp: 18  Temp: 98.4 F (36.9 C)  SpO2: 99%     Body mass index is 36.03 kg/m.    ECOG FS: 2 - Symptomatic, <50% confined to bed    Sclerae unicteric, pupils round and  equal No cervical or supraclavicular adenopathy, significant hearing loss Lungs no rales or rhonchi Heart regular rate and rhythm Abd soft, obese nontender, positive bowel sounds MSK no focal spinal tenderness, no upper extremity lymphedema Neuro: nonfocal, generally well oriented, appropriate affect Breasts: The area of tenderness that she complains about in the right breast is associated with her scar.  There is no erythema or palpable mass.  Left breast is unremarkable.  Both axillae are benign.  LAB RESULTS:  CMP     Component Value Date/Time   NA 140 09/05/2017 1027   NA 141 05/16/2017 1416   K 4.2 09/05/2017 1027   K 3.8 05/16/2017 1416   CL 107 09/05/2017 1027   CO2 27 09/05/2017 1027   CO2 27 05/16/2017 1416   GLUCOSE 82 09/05/2017 1027   GLUCOSE 140 05/16/2017 1416   BUN 19 09/05/2017 1027   BUN 13.2 05/16/2017 1416   CREATININE 0.71 09/05/2017 1027   CREATININE 0.8 05/16/2017 1416   CALCIUM 10.3 09/05/2017 1027   CALCIUM 11.0 (H) 05/16/2017 1416   PROT 6.8 09/05/2017 1027   PROT 6.9 05/16/2017 1416   ALBUMIN 3.6 09/05/2017 1027   ALBUMIN 3.6 05/16/2017 1416   AST 13 09/05/2017 1027   AST 13 05/16/2017 1416   ALT 17 09/05/2017 1027   ALT <6 05/16/2017 1416   ALKPHOS 85 09/05/2017 1027   ALKPHOS 130 05/16/2017 1416   BILITOT 0.3 09/05/2017 1027   BILITOT 0.31 05/16/2017 1416   GFRNONAA >60 09/05/2017 1027   GFRNONAA 75 08/19/2015 1602   GFRAA >60 09/05/2017 1027   GFRAA 87 08/19/2015 1602    I No results found for: SPEP  Lab Results  Component Value Date   WBC 6.9 08/08/2017   NEUTROABS 4.7 08/08/2017   HGB 12.9 08/08/2017   HCT 40.1 08/08/2017   MCV 83.1 08/08/2017   PLT 265 08/08/2017      Chemistry      Component Value Date/Time   NA 140 09/05/2017 1027   NA 141 05/16/2017 1416   K 4.2 09/05/2017 1027   K 3.8 05/16/2017 1416   CL 107 09/05/2017 1027   CO2 27 09/05/2017 1027   CO2 27 05/16/2017 1416   BUN 19 09/05/2017 1027   BUN 13.2  05/16/2017 1416   CREATININE 0.71 09/05/2017 1027   CREATININE 0.8 05/16/2017 1416      Component Value Date/Time   CALCIUM 10.3 09/05/2017 1027   CALCIUM 11.0 (H) 05/16/2017 1416   ALKPHOS 85 09/05/2017 1027   ALKPHOS 130 05/16/2017 1416  AST 13 09/05/2017 1027   AST 13 05/16/2017 1416   ALT 17 09/05/2017 1027   ALT <6 05/16/2017 1416   BILITOT 0.3 09/05/2017 1027   BILITOT 0.31 05/16/2017 1416       No results found for: LABCA2  No components found for: LABCA125  Recent Labs  Lab 09/05/17 1027  INR 1.51    Urinalysis    Component Value Date/Time   COLORURINE STRAW (A) 01/15/2017 2000   APPEARANCEUR CLEAR 01/15/2017 2000   LABSPEC 1.008 01/15/2017 2000   LABSPEC 1.010 09/22/2016 1553   PHURINE 6.0 01/15/2017 2000   GLUCOSEU NEGATIVE 01/15/2017 2000   GLUCOSEU Negative 09/22/2016 1553   HGBUR SMALL (A) 01/15/2017 2000   BILIRUBINUR NEGATIVE 01/15/2017 2000   BILIRUBINUR Negative 01/06/2017 1608   BILIRUBINUR Negative 09/22/2016 1553   KETONESUR NEGATIVE 01/15/2017 2000   PROTEINUR NEGATIVE 01/15/2017 2000   UROBILINOGEN 0.2 01/06/2017 1608   UROBILINOGEN 0.2 11/15/2016 1358   UROBILINOGEN 0.2 09/22/2016 1553   NITRITE NEGATIVE 01/15/2017 2000   LEUKOCYTESUR NEGATIVE 01/15/2017 2000   LEUKOCYTESUR Negative 09/22/2016 1553    STUDIES: No results found.  ASSESSMENT: 77 y.o. [place] woman status post right breast upper outer quadrant lumpectomy and sentinel lymph node sampling 09/09/2013 for an mpT1c pN1a, stage IIA invasive ductal carcinoma, estrogen and progesterone receptor both 100% positive with strong staining intensity, MIB-1 of 17% and no HER-2 amplification  (1) additional surgery for margin clearance 09/16/2013 obtained negative margins  (2) Oncotype DX recurrence score of 4 predicts a risk of outside the breast recurrence within 10 years of 7% if the patient's only systemic therapy is tamoxifen for 5 years. It also predicts no benefit from  chemotherapy  (3) adjuvant radiation completed 01/07/2014  (4) anastrozole started 02/27/2014 stopped within 2 weeks because of arm swelling.   (a) bone density April 2016 showed osteopenia, with a t-score of -1.6  (b) anastrozole resumed 12/17/2015  (c) Bone density 09/20/2016 showed a T score of -2.2  (5) history of left lower extremity DVT 11/23/2012, initially on rivaroxaban, which caused chest pain, switch to Coumadin July 2014  (b) coumadin dose increased to 7.5/10 mg alternating days as of 06/13/2017   METASTATIC DISEASE: August 2018 (6) status post left total hip replacement 01/16/2017 for estrogen receptor positive adenocarcinoma.  (a) CA 27-29 was 46.4 as of 04/18/2017  (b) chest CT scan 05/02/2017 shows no lung or liver lesion concern; it does show aortic atherosclerosis  (c) baseline bone scan 05/02/2017 was negative  (7) fulvestrant started 04/18/2017  (8) denosumab/Xgeva started 05/16/2017  PLAN:  Alicia Vasquez is tolerating her treatment well, with no significant side effects other than the discomfort of the shots themselves.  She is going to be restaged with a PET scan next week.  I will call her with those results and then we will discuss them again when she returns to see me in a month.  Her INR is again not therapeutic.  She admits to having missed some doses.  She is going to take 2 tablets for the next 2 days and then go back to the prior regimen.  She was again cautioned regarding falls.  She is very careful to use her walker at all times in the home.  It is gratifying to see that she is doing better in terms of activities and we were actually able to weigh her today which has not been possible.  As far as her sinus problems is concerned I suggested she try Claritin.  She knows to call for any other issues that may develop before the next visit.  Magrinat, [name], MD  09/05/17 11:24 AM Medical Oncology and Hematology [facility] [place] [place], [place] [address] Tel. [phone]    Fax. [phone]  This document serves as a record of services personally performed by Lurline Del, MD. It was created on his behalf by Sheron Nightingale, a trained medical scribe. The creation of this record is based on the scribe's personal observations and the provider's statements to them.   I have reviewed the above documentation for accuracy and completeness, and I agree with the above.

## 2017-09-05 ENCOUNTER — Ambulatory Visit: Payer: Medicare Other

## 2017-09-05 ENCOUNTER — Inpatient Hospital Stay: Payer: Medicare Other | Attending: Oncology

## 2017-09-05 ENCOUNTER — Telehealth: Payer: Self-pay | Admitting: Oncology

## 2017-09-05 ENCOUNTER — Inpatient Hospital Stay (HOSPITAL_BASED_OUTPATIENT_CLINIC_OR_DEPARTMENT_OTHER): Payer: Medicare Other | Admitting: Oncology

## 2017-09-05 ENCOUNTER — Inpatient Hospital Stay: Payer: Medicare Other

## 2017-09-05 ENCOUNTER — Other Ambulatory Visit: Payer: Medicare Other

## 2017-09-05 VITALS — BP 124/55 | HR 77 | Temp 98.4°F | Resp 18 | Ht 64.0 in | Wt 209.9 lb

## 2017-09-05 DIAGNOSIS — C50411 Malignant neoplasm of upper-outer quadrant of right female breast: Secondary | ICD-10-CM

## 2017-09-05 DIAGNOSIS — Z170 Estrogen receptor positive status [ER+]: Secondary | ICD-10-CM

## 2017-09-05 DIAGNOSIS — H9202 Otalgia, left ear: Secondary | ICD-10-CM

## 2017-09-05 DIAGNOSIS — E039 Hypothyroidism, unspecified: Secondary | ICD-10-CM | POA: Diagnosis not present

## 2017-09-05 DIAGNOSIS — Z7901 Long term (current) use of anticoagulants: Secondary | ICD-10-CM

## 2017-09-05 DIAGNOSIS — M84659P Pathological fracture in other disease, hip, unspecified, subsequent encounter for fracture with malunion: Secondary | ICD-10-CM

## 2017-09-05 DIAGNOSIS — Z8042 Family history of malignant neoplasm of prostate: Secondary | ICD-10-CM

## 2017-09-05 DIAGNOSIS — Z79818 Long term (current) use of other agents affecting estrogen receptors and estrogen levels: Secondary | ICD-10-CM | POA: Insufficient documentation

## 2017-09-05 DIAGNOSIS — M1990 Unspecified osteoarthritis, unspecified site: Secondary | ICD-10-CM | POA: Diagnosis not present

## 2017-09-05 DIAGNOSIS — Z86718 Personal history of other venous thrombosis and embolism: Secondary | ICD-10-CM | POA: Diagnosis not present

## 2017-09-05 DIAGNOSIS — F418 Other specified anxiety disorders: Secondary | ICD-10-CM | POA: Insufficient documentation

## 2017-09-05 DIAGNOSIS — I10 Essential (primary) hypertension: Secondary | ICD-10-CM | POA: Insufficient documentation

## 2017-09-05 DIAGNOSIS — M84559P Pathological fracture in neoplastic disease, hip, unspecified, subsequent encounter for fracture with malunion: Secondary | ICD-10-CM

## 2017-09-05 DIAGNOSIS — R51 Headache: Secondary | ICD-10-CM | POA: Diagnosis not present

## 2017-09-05 DIAGNOSIS — M899 Disorder of bone, unspecified: Secondary | ICD-10-CM

## 2017-09-05 DIAGNOSIS — R42 Dizziness and giddiness: Secondary | ICD-10-CM

## 2017-09-05 DIAGNOSIS — Z800 Family history of malignant neoplasm of digestive organs: Secondary | ICD-10-CM

## 2017-09-05 DIAGNOSIS — C7951 Secondary malignant neoplasm of bone: Secondary | ICD-10-CM | POA: Diagnosis not present

## 2017-09-05 DIAGNOSIS — G893 Neoplasm related pain (acute) (chronic): Secondary | ICD-10-CM

## 2017-09-05 DIAGNOSIS — M84459A Pathological fracture, hip, unspecified, initial encounter for fracture: Secondary | ICD-10-CM

## 2017-09-05 DIAGNOSIS — Z923 Personal history of irradiation: Secondary | ICD-10-CM | POA: Diagnosis not present

## 2017-09-05 DIAGNOSIS — Z79899 Other long term (current) drug therapy: Secondary | ICD-10-CM | POA: Insufficient documentation

## 2017-09-05 DIAGNOSIS — D6859 Other primary thrombophilia: Secondary | ICD-10-CM

## 2017-09-05 DIAGNOSIS — I700 Atherosclerosis of aorta: Secondary | ICD-10-CM

## 2017-09-05 DIAGNOSIS — I82401 Acute embolism and thrombosis of unspecified deep veins of right lower extremity: Secondary | ICD-10-CM

## 2017-09-05 LAB — COMPREHENSIVE METABOLIC PANEL
ALK PHOS: 85 U/L (ref 40–150)
ALT: 17 U/L (ref 0–55)
ANION GAP: 6 (ref 3–11)
AST: 13 U/L (ref 5–34)
Albumin: 3.6 g/dL (ref 3.5–5.0)
BILIRUBIN TOTAL: 0.3 mg/dL (ref 0.2–1.2)
BUN: 19 mg/dL (ref 7–26)
CALCIUM: 10.3 mg/dL (ref 8.4–10.4)
CO2: 27 mmol/L (ref 22–29)
Chloride: 107 mmol/L (ref 98–109)
Creatinine, Ser: 0.71 mg/dL (ref 0.60–1.10)
GFR calc non Af Amer: >60 mL/min (ref >60)
Glucose, Bld: 82 mg/dL (ref 70–140)
Potassium: 4.2 mmol/L (ref 3.5–5.1)
SODIUM: 140 mmol/L (ref 136–145)
TOTAL PROTEIN: 6.8 g/dL (ref 6.4–8.3)

## 2017-09-05 LAB — PROTIME-INR
INR: 1.51
PROTHROMBIN TIME: 18.1 s — AB (ref 11.4–15.2)

## 2017-09-05 MED ORDER — DENOSUMAB 120 MG/1.7ML ~~LOC~~ SOLN
SUBCUTANEOUS | Status: AC
  Filled 2017-09-05: qty 1.7

## 2017-09-05 MED ORDER — FULVESTRANT 250 MG/5ML IM SOLN
500.0000 mg | Freq: Once | INTRAMUSCULAR | Status: AC
  Administered 2017-09-05: 500 mg via INTRAMUSCULAR

## 2017-09-05 MED ORDER — DENOSUMAB 120 MG/1.7ML ~~LOC~~ SOLN
120.0000 mg | Freq: Once | SUBCUTANEOUS | Status: AC
  Administered 2017-09-05: 120 mg via SUBCUTANEOUS

## 2017-09-05 MED ORDER — FULVESTRANT 250 MG/5ML IM SOLN
INTRAMUSCULAR | Status: AC
  Filled 2017-09-05: qty 10

## 2017-09-05 MED ORDER — HYDROCODONE-ACETAMINOPHEN 5-325 MG PO TABS: 1.0000 | ORAL_TABLET | Freq: Four times a day (QID) | ORAL | 0 refills | Status: DC | PRN

## 2017-09-05 NOTE — Patient Instructions (Signed)
Denosumab injection What is this medicine? DENOSUMAB (den oh sue mab) slows bone breakdown. Prolia is used to treat osteoporosis in women after menopause and in men. Delton See is used to treat a high calcium level due to cancer and to prevent bone fractures and other bone problems caused by multiple myeloma or cancer bone metastases. Delton See is also used to treat giant cell tumor of the bone. This medicine may be used for other purposes; ask your health care provider or pharmacist if you have questions. COMMON BRAND NAME(S): Prolia, XGEVA What should I tell my health care provider before I take this medicine? They need to know if you have any of these conditions: -dental disease -having surgery or tooth extraction -infection -kidney disease -low levels of calcium or Vitamin D in the blood -malnutrition -on hemodialysis -skin conditions or sensitivity -thyroid or parathyroid disease -an unusual reaction to denosumab, other medicines, foods, dyes, or preservatives -pregnant or trying to get pregnant -breast-feeding How should I use this medicine? This medicine is for injection under the skin. It is given by a health care professional in a hospital or clinic setting. If you are getting Prolia, a special MedGuide will be given to you by the pharmacist with each prescription and refill. Be sure to read this information carefully each time. For Prolia, talk to your pediatrician regarding the use of this medicine in children. Special care may be needed. For Delton See, talk to your pediatrician regarding the use of this medicine in children. While this drug may be prescribed for children as young as 13 years for selected conditions, precautions do apply. Overdosage: If you think you have taken too much of this medicine contact a poison control center or emergency room at once. NOTE: This medicine is only for you. Do not share this medicine with others. What if I miss a dose? It is important not to miss your  dose. Call your doctor or health care professional if you are unable to keep an appointment. What may interact with this medicine? Do not take this medicine with any of the following medications: -other medicines containing denosumab This medicine may also interact with the following medications: -medicines that lower your chance of fighting infection -steroid medicines like prednisone or cortisone This list may not describe all possible interactions. Give your health care provider a list of all the medicines, herbs, non-prescription drugs, or dietary supplements you use. Also tell them if you smoke, drink alcohol, or use illegal drugs. Some items may interact with your medicine. What should I watch for while using this medicine? Visit your doctor or health care professional for regular checks on your progress. Your doctor or health care professional may order blood tests and other tests to see how you are doing. Call your doctor or health care professional for advice if you get a fever, chills or sore throat, or other symptoms of a cold or flu. Do not treat yourself. This drug may decrease your body's ability to fight infection. Try to avoid being around people who are sick. You should make sure you get enough calcium and vitamin D while you are taking this medicine, unless your doctor tells you not to. Discuss the foods you eat and the vitamins you take with your health care professional. See your dentist regularly. Brush and floss your teeth as directed. Before you have any dental work done, tell your dentist you are receiving this medicine. Do not become pregnant while taking this medicine or for 5 months after stopping  it. Talk with your doctor or health care professional about your birth control options while taking this medicine. Women should inform their doctor if they wish to become pregnant or think they might be pregnant. There is a potential for serious side effects to an unborn child. Talk  to your health care professional or pharmacist for more information. What side effects may I notice from receiving this medicine? Side effects that you should report to your doctor or health care professional as soon as possible: -allergic reactions like skin rash, itching or hives, swelling of the face, lips, or tongue -bone pain -breathing problems -dizziness -jaw pain, especially after dental work -redness, blistering, peeling of the skin -signs and symptoms of infection like fever or chills; cough; sore throat; pain or trouble passing urine -signs of low calcium like fast heartbeat, muscle cramps or muscle pain; pain, tingling, numbness in the hands or feet; seizures -unusual bleeding or bruising -unusually weak or tired Side effects that usually do not require medical attention (report to your doctor or health care professional if they continue or are bothersome): -constipation -diarrhea -headache -joint pain -loss of appetite -muscle pain -runny nose -tiredness -upset stomach This list may not describe all possible side effects. Call your doctor for medical advice about side effects. You may report side effects to FDA at 1-800-FDA-1088. Where should I keep my medicine? This medicine is only given in a clinic, doctor's office, or other health care setting and will not be stored at home. NOTE: This sheet is a summary. It may not cover all possible information. If you have questions about this medicine, talk to your doctor, pharmacist, or health care provider.  2018 Elsevier/Gold Standard (2016-06-07 19:17:21)   Fulvestrant injection What is this medicine? FULVESTRANT (ful VES trant) blocks the effects of estrogen. It is used to treat breast cancer. This medicine may be used for other purposes; ask your health care provider or pharmacist if you have questions. COMMON BRAND NAME(S): FASLODEX What should I tell my health care provider before I take this medicine? They need to  know if you have any of these conditions: -bleeding problems -liver disease -low levels of platelets in the blood -an unusual or allergic reaction to fulvestrant, other medicines, foods, dyes, or preservatives -pregnant or trying to get pregnant -breast-feeding How should I use this medicine? This medicine is for injection into a muscle. It is usually given by a health care professional in a hospital or clinic setting. Talk to your pediatrician regarding the use of this medicine in children. Special care may be needed. Overdosage: If you think you have taken too much of this medicine contact a poison control center or emergency room at once. NOTE: This medicine is only for you. Do not share this medicine with others. What if I miss a dose? It is important not to miss your dose. Call your doctor or health care professional if you are unable to keep an appointment. What may interact with this medicine? -medicines that treat or prevent blood clots like warfarin, enoxaparin, and dalteparin This list may not describe all possible interactions. Give your health care provider a list of all the medicines, herbs, non-prescription drugs, or dietary supplements you use. Also tell them if you smoke, drink alcohol, or use illegal drugs. Some items may interact with your medicine. What should I watch for while using this medicine? Your condition will be monitored carefully while you are receiving this medicine. You will need important blood work done while you   this medicine. Do not become pregnant while taking this medicine or for at least 1 year after stopping it. Women of child-bearing potential will need to have a negative pregnancy test before starting this medicine. Women should inform their doctor if they wish to become pregnant or think they might be pregnant. There is a potential for serious side effects to an unborn child. Men should inform their doctors if they wish to father a child. This  medicine may lower sperm counts. Talk to your health care professional or pharmacist for more information. Do not breast-feed an infant while taking this medicine or for 1 year after the last dose. What side effects may I notice from receiving this medicine? Side effects that you should report to your doctor or health care professional as soon as possible: -allergic reactions like skin rash, itching or hives, swelling of the face, lips, or tongue -feeling faint or lightheaded, falls -pain, tingling, numbness, or weakness in the legs -signs and symptoms of infection like fever or chills; cough; flu-like symptoms; sore throat -vaginal bleeding Side effects that usually do not require medical attention (report to your doctor or health care professional if they continue or are bothersome): -aches, pains -constipation -diarrhea -headache -hot flashes -nausea, vomiting -pain at site where injected -stomach pain This list may not describe all possible side effects. Call your doctor for medical advice about side effects. You may report side effects to FDA at 1-800-FDA-1088. Where should I keep my medicine? This drug is given in a hospital or clinic and will not be stored at home. NOTE: This sheet is a summary. It may not cover all possible information. If you have questions about this medicine, talk to your doctor, pharmacist, or health care provider.  2018 Elsevier/Gold Standard (2014-12-12 11:03:55)

## 2017-09-05 NOTE — Telephone Encounter (Signed)
Gave patient AVs and calendar of upcoming may appointments.  °

## 2017-09-06 LAB — CANCER ANTIGEN 27.29: CA 27.29: 39.5 U/mL — ABNORMAL HIGH (ref 0.0–38.6)

## 2017-09-12 ENCOUNTER — Encounter: Payer: Self-pay | Admitting: Oncology

## 2017-09-12 ENCOUNTER — Encounter (HOSPITAL_COMMUNITY)
Admission: RE | Admit: 2017-09-12 | Discharge: 2017-09-12 | Disposition: A | Discharge status: Home / Self Care | Payer: Medicare Other | Source: Ambulatory Visit | Attending: Oncology | Admitting: Oncology

## 2017-09-12 DIAGNOSIS — C50411 Malignant neoplasm of upper-outer quadrant of right female breast: Secondary | ICD-10-CM | POA: Diagnosis not present

## 2017-09-12 DIAGNOSIS — M84559P Pathological fracture in neoplastic disease, hip, unspecified, subsequent encounter for fracture with malunion: Secondary | ICD-10-CM | POA: Diagnosis not present

## 2017-09-12 DIAGNOSIS — C7951 Secondary malignant neoplasm of bone: Secondary | ICD-10-CM | POA: Diagnosis not present

## 2017-09-12 DIAGNOSIS — Z86711 Personal history of pulmonary embolism: Secondary | ICD-10-CM

## 2017-09-12 DIAGNOSIS — Z170 Estrogen receptor positive status [ER+]: Secondary | ICD-10-CM | POA: Insufficient documentation

## 2017-09-12 DIAGNOSIS — E039 Hypothyroidism, unspecified: Secondary | ICD-10-CM | POA: Diagnosis not present

## 2017-09-12 DIAGNOSIS — C50911 Malignant neoplasm of unspecified site of right female breast: Secondary | ICD-10-CM | POA: Diagnosis not present

## 2017-09-12 LAB — GLUCOSE, CAPILLARY: Glucose-Capillary: 92 mg/dL (ref 65–99)

## 2017-09-12 MED ORDER — FLUDEOXYGLUCOSE F - 18 (FDG) INJECTION
10.4000 | Freq: Once | INTRAVENOUS | Status: AC | PRN
  Administered 2017-09-12: 10.4 via INTRAVENOUS

## 2017-09-22 ENCOUNTER — Encounter: Payer: Self-pay | Admitting: Family Medicine

## 2017-09-22 ENCOUNTER — Ambulatory Visit (INDEPENDENT_AMBULATORY_CARE_PROVIDER_SITE_OTHER): Payer: Medicare Other | Admitting: Family Medicine

## 2017-09-22 VITALS — BP 124/60 | HR 80 | Temp 97.9°F

## 2017-09-22 DIAGNOSIS — M79675 Pain in left toe(s): Secondary | ICD-10-CM | POA: Diagnosis not present

## 2017-09-22 DIAGNOSIS — L600 Ingrowing nail: Secondary | ICD-10-CM

## 2017-09-22 MED ORDER — PREDNISONE 5 MG PO TABS: ORAL_TABLET | ORAL | 0 refills | Status: DC

## 2017-09-22 MED ORDER — CLINDAMYCIN HCL 300 MG PO CAPS: 300.0000 mg | ORAL_CAPSULE | Freq: Three times a day (TID) | ORAL | 0 refills | Status: DC

## 2017-09-22 NOTE — Progress Notes (Signed)
Alicia Vasquez is a 77 y.o. female here for an acute visit.  History of Present Illness:   Alicia Vasquez, CMA acting as scribe for Dr. Briscoe Deutscher.   HPI: Ingrown toenail for three weeks. She also is having fullness in left ear.   PMHx, SurgHx, SocialHx, Medications, and Allergies were reviewed in the Visit Navigator and updated as appropriate.  Current Medications:   .  Cholecalciferol (VITAMIN D3) 5000 UNITS TABS, Take 5,000 Units by mouth daily. , Disp: , Rfl:  .  HYDROcodone-acetaminophen (NORCO/VICODIN) 5-325 MG tablet, Take 1 tablet by mouth every 6 (six) hours as needed for moderate pain., Disp: 120 tablet, Rfl: 0 .  levothyroxine (SYNTHROID) 125 MCG tablet, Take 1 tablet (125 mcg total) by mouth daily before breakfast. Dispense as written: Synthroid LAST REFILL, Disp: 90 tablet, Rfl: 3 .  polyethylene glycol (MIRALAX / GLYCOLAX) packet, Take 17 g by mouth 2 (two) times daily., Disp: 14 each, Rfl: 0 .  vitamin C (ASCORBIC ACID) 250 MG tablet, Take 250 mg by mouth 3 (three) times daily., Disp: , Rfl:  .  warfarin (JANTOVEN) 5 MG tablet, Take 1.5-2 tablets (7.5-10 mg total) by mouth one time only at 6 PM for 1 dose. Take 7.5 mg alternating with 10 mg po daily, Disp: 90 tablet, Rfl: 4   Allergies  Allergen Reactions  . Anesthetics, Amide Hypertension  . Benadryl [Diphenhydramine Hcl] Other (See Comments)    Dizziness  . Carbocaine [Mepivacaine Hcl] Hypertension  . Codeine Other (See Comments)    Dizziness  . Epinephrine Hypertension  . Sulfa Antibiotics Other (See Comments)    dizziness  . Latex Other (See Comments)    Blisters in mouth  . Tramadol     Sedation.   Marland Kitchen Penicillins Rash    Has patient had a PCN reaction causing immediate rash, facial/tongue/throat swelling, SOB or lightheadedness with hypotension: Unknown Has patient had a PCN reaction causing severe rash involving mucus membranes or skin necrosis: Unknown Has patient had a PCN reaction that required  hospitalization: Unknown Has patient had a PCN reaction occurring within the last 10 years: Unknown If all of the above answers are "NO", then may proceed with Cephalosporin use.    Review of Systems:   Pertinent items are noted in the HPI. Otherwise, ROS is negative.  Vitals:   Vitals:   09/22/17 1528  BP: 124/60  Pulse: 80  Temp: 97.9 F (36.6 C)  TempSrc: Oral  SpO2: 94%     There is no height or weight on file to calculate BMI.  Physical Exam:   Physical Exam  Constitutional: She appears well-nourished.  HENT:  Head: Normocephalic and atraumatic.  Eyes: Pupils are equal, round, and reactive to light. EOM are normal.  Neck: Normal range of motion. Neck supple.  Cardiovascular: Normal rate, regular rhythm, normal heart sounds and intact distal pulses.  Pulmonary/Chest: Effort normal.  Abdominal: Soft.  Skin: Skin is warm. Left great toe with ingrown nail, thick dystrophic nail. Erythema and heat and joint. No open lesion, discharge, or streaking. Psychiatric: She has a normal mood and affect. Her behavior is normal.  Nursing note and vitals reviewed.   Results for orders placed or performed in visit on 09/22/17  CBC with Differential/Platelet  Result Value Ref Range   WBC 7.1 3.8 - 10.8 Thousand/uL   RBC 4.67 3.80 - 5.10 Million/uL   Hemoglobin 12.9 11.7 - 15.5 g/dL   HCT 38.6 35.0 - 45.0 %   MCV 82.7  80.0 - 100.0 fL   MCH 27.6 27.0 - 33.0 pg   MCHC 33.4 32.0 - 36.0 g/dL   RDW 15.0 11.0 - 15.0 %   Platelets 274 140 - 400 Thousand/uL   MPV 9.7 7.5 - 12.5 fL   Neutro Abs 4,132 1,500 - 7,800 cells/uL   Lymphs Abs 1,377 850 - 3,900 cells/uL   WBC mixed population 1,257 (H) 200 - 950 cells/uL   Eosinophils Absolute 241 15 - 500 cells/uL   Basophils Absolute 92 0 - 200 cells/uL   Neutrophils Relative % 58.2 %   Total Lymphocyte 19.4 %   Monocytes Relative 17.7 %   Eosinophils Relative 3.4 %   Basophils Relative 1.3 %  Comprehensive metabolic panel  Result  Value Ref Range   Glucose, Bld 99 65 - 99 mg/dL   BUN 16 7 - 25 mg/dL   Creat 0.80 0.60 - 0.93 mg/dL   BUN/Creatinine Ratio NOT APPLICABLE 6 - 22 (calc)   Sodium 141 135 - 146 mmol/L   Potassium 5.4 (H) 3.5 - 5.3 mmol/L   Chloride 107 98 - 110 mmol/L   CO2 28 20 - 32 mmol/L   Calcium 10.3 8.6 - 10.4 mg/dL   Total Protein 6.4 6.1 - 8.1 g/dL   Albumin 3.9 3.6 - 5.1 g/dL   Globulin 2.5 1.9 - 3.7 g/dL (calc)   AG Ratio 1.6 1.0 - 2.5 (calc)   Total Bilirubin 0.4 0.2 - 1.2 mg/dL   Alkaline phosphatase (APISO) 78 33 - 130 U/L   AST 14 10 - 35 U/L   ALT 13 6 - 29 U/L  Uric acid  Result Value Ref Range   Uric Acid, Serum 5.4 2.5 - 7.0 mg/dL   *Note: Due to a large number of results and/or encounters for the requested time period, some results have not been displayed. A complete set of results can be found in Results Review.   Assessment and Plan:   1. Pain of left great toe Cellulitis versus gout. Will cover for both. Labs reassuring. To Podiatry - thick nails that need to be resected, on Coumadin.   - predniSONE (DELTASONE) 5 MG tablet; 6-5-4-3-2-1-off  Dispense: 21 tablet; Refill: 0 - clindamycin (CLEOCIN) 300 MG capsule; Take 1 capsule (300 mg total) by mouth 3 (three) times daily.  Dispense: 30 capsule; Refill: 0 - CBC with Differential/Platelet - Comprehensive metabolic panel - Uric acid - Ambulatory referral to Podiatry  2. Ingrown nail As above.  - Ambulatory referral to Podiatry   . Reviewed expectations re: course of current medical issues. . Discussed self-management of symptoms. . Outlined signs and symptoms indicating need for more acute intervention. . Patient verbalized understanding and all questions were answered. Marland Kitchen Health Maintenance issues including appropriate healthy diet, exercise, and smoking avoidance were discussed with patient. . See orders for this visit as documented in the electronic medical record. . Patient received an After Visit Summary.  CMA  served as Education administrator during this visit. History, Physical, and Plan performed by medical provider. The above documentation has been reviewed and is accurate and complete. Briscoe Deutscher, D.O.  Briscoe Deutscher, DO [place], [place] [facility] 09/24/2017

## 2017-09-23 LAB — COMPREHENSIVE METABOLIC PANEL
AG Ratio: 1.6 (calc) (ref 1.0–2.5)
ALT: 13 U/L (ref 6–29)
AST: 14 U/L (ref 10–35)
Albumin: 3.9 g/dL (ref 3.6–5.1)
Alkaline phosphatase (APISO): 78 U/L (ref 33–130)
BUN: 16 mg/dL (ref 7–25)
CO2: 28 mmol/L (ref 20–32)
Calcium: 10.3 mg/dL (ref 8.6–10.4)
Chloride: 107 mmol/L (ref 98–110)
Creat: 0.8 mg/dL (ref 0.60–0.93)
Globulin: 2.5 g/dL (calc) (ref 1.9–3.7)
Glucose, Bld: 99 mg/dL (ref 65–99)
Potassium: 5.4 mmol/L — ABNORMAL HIGH (ref 3.5–5.3)
Sodium: 141 mmol/L (ref 135–146)
Total Bilirubin: 0.4 mg/dL (ref 0.2–1.2)
Total Protein: 6.4 g/dL (ref 6.1–8.1)

## 2017-09-23 LAB — CBC WITH DIFFERENTIAL/PLATELET
Basophils Absolute: 92 cells/uL (ref 0–200)
Basophils Relative: 1.3 %
Eosinophils Absolute: 241 cells/uL (ref 15–500)
Eosinophils Relative: 3.4 %
HCT: 38.6 % (ref 35.0–45.0)
Hemoglobin: 12.9 g/dL (ref 11.7–15.5)
Lymphs Abs: 1377 cells/uL (ref 850–3900)
MCH: 27.6 pg (ref 27.0–33.0)
MCHC: 33.4 g/dL (ref 32.0–36.0)
MCV: 82.7 fL (ref 80.0–100.0)
MPV: 9.7 fL (ref 7.5–12.5)
Monocytes Relative: 17.7 %
Neutro Abs: 4132 cells/uL (ref 1500–7800)
Neutrophils Relative %: 58.2 %
Platelets: 274 10*3/uL (ref 140–400)
RBC: 4.67 10*6/uL (ref 3.80–5.10)
RDW: 15 % (ref 11.0–15.0)
Total Lymphocyte: 19.4 %
WBC mixed population: 1257 cells/uL — ABNORMAL HIGH (ref 200–950)
WBC: 7.1 10*3/uL (ref 3.8–10.8)

## 2017-09-23 LAB — URIC ACID: Uric Acid, Serum: 5.4 mg/dL (ref 2.5–7.0)

## 2017-09-25 ENCOUNTER — Encounter: Payer: Self-pay | Admitting: Podiatry

## 2017-09-25 ENCOUNTER — Ambulatory Visit (INDEPENDENT_AMBULATORY_CARE_PROVIDER_SITE_OTHER): Payer: Medicare Other | Admitting: Podiatry

## 2017-09-25 VITALS — BP 123/73 | HR 70 | Resp 16

## 2017-09-25 DIAGNOSIS — M79674 Pain in right toe(s): Secondary | ICD-10-CM

## 2017-09-25 DIAGNOSIS — M79675 Pain in left toe(s): Secondary | ICD-10-CM | POA: Diagnosis not present

## 2017-09-25 DIAGNOSIS — B351 Tinea unguium: Secondary | ICD-10-CM

## 2017-09-25 DIAGNOSIS — L03031 Cellulitis of right toe: Secondary | ICD-10-CM | POA: Diagnosis not present

## 2017-09-25 NOTE — Progress Notes (Signed)
   Subjective:    Patient ID: Alicia Vasquez, female    DOB: April 09, 1941, 77 y.o.   MRN: [id number]  HPI    Review of Systems  Respiratory: Positive for shortness of breath.   Cardiovascular: Positive for chest pain and leg swelling.  All other systems reviewed and are negative.      Objective:   Physical Exam        Assessment & Plan:

## 2017-09-26 ENCOUNTER — Telehealth: Payer: Self-pay | Admitting: Podiatry

## 2017-09-26 NOTE — Telephone Encounter (Signed)
Pt needs you to give her a call to go over some discharge instructions. Please call the [phone] number.

## 2017-09-26 NOTE — Progress Notes (Signed)
Subjective:   Patient ID: Alicia Vasquez, female   DOB: 77 y.o.   MRN: [id number]   HPI Patient presents with nail disease 1-5 both feet with the right big toenail being very incurvated in the corner sore with distal redness noted. Patient's not been able to touch it and has tried to trim it without relief of symptoms and presents with caregiver today. Patient does not smoke and is not intact   Review of Systems  All other systems reviewed and are negative.       Objective:  Physical Exam  Constitutional: She appears well-developed and well-nourished.  Cardiovascular: Intact distal pulses.  Pulmonary/Chest: Effort normal.  Musculoskeletal: Normal range of motion.  Neurological: She is alert.  Skin: Skin is warm.  Nursing note and vitals reviewed.   Neurovascular status was found to be mildly diminished with diminished sharp Dole vibratory and diminished muscle strength knowing bilateral. Patient's found to have incurvated right hallux nail medial lateral borders with distal redness lateral border that's painful when pressed with no active drainage noted. 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

## 2017-09-26 NOTE — Telephone Encounter (Signed)
Unable to contact pt to discuss discharge orders phone was busy.

## 2017-09-27 NOTE — Telephone Encounter (Addendum)
Unable to leave a message on mobile phone the voicemail box was not set up.

## 2017-09-27 NOTE — Telephone Encounter (Signed)
Pt asked if she had to keep an antibiotic bandaid or anything on the toe after the procedure, she is on an antibiotic from her PCP. I told pt to soak her foot twice daily for 20 minutes each session and cover the area with an antibiotic ointment bandaid for the newt 2-4 weeks until the area gets a dry hard scab, it the area worsened then call for an appt. Pt states understanding.

## 2017-09-28 ENCOUNTER — Ambulatory Visit
Admission: RE | Admit: 2017-09-28 | Discharge: 2017-09-28 | Disposition: A | Discharge status: Home / Self Care | Payer: Medicare Other | Source: Ambulatory Visit | Attending: General Surgery | Admitting: General Surgery

## 2017-09-28 DIAGNOSIS — R922 Inconclusive mammogram: Secondary | ICD-10-CM | POA: Diagnosis not present

## 2017-10-02 NOTE — Progress Notes (Signed)
[facility]  [phone] Fax:([phone]) [phone]     ID: Alicia Vasquez OB: 1941-02-01  MR#: [id number]  UXL#:[id number]  PCP: Briscoe Deutscher, DO GYN:   SU: [name] OTHER MD: Thea Silversmith, Hart Robinsons, Philemon Kingdom  CHIEF COMPLAINT: Estrogen receptor positive breast cancer  CURRENT TREATMENT:  Fulvestrant and denosumab/Xgeva   INTERVAL HISTORY: Alicia Vasquez is here today for follow up and treatment of her metastatic estrogen receptor positive breast cancer accompanied by her friend. Alicia Vasquez receives fulvestrant every 4 weeks. She tolerates this well with no complications.   She also receives monthly denosumab/Xgeva. She also tolerates this well without having any pain.   Since her last visit, she underwent diagnostic bilateral mammography with CAD and tomography on 09/28/2017 at [facility] showing: breast density category C. There was no evidence of malignancy.   She also completed a PET scan on 09/12/2017 showing: No evidence for hypermetabolic metastatic disease in the neck, chest, abdomen, or pelvis. Symmetric diffuse uptake in the thyroid parenchyma. While nonspecific, this appearance can be seen in the setting of thyroiditis. Tiny hiatal hernia.  Aortic Atherosclerois (ICD10-170.0)   REVIEW OF SYSTEMS: Alicia Vasquez reports that she is doing well. She is walking more. She walked a lot this past weekend. She is in a wheelchair today, because walking around the hospital is extensive for her. She has swelling in both legs. Her son is moving to [place] and looking for a job in the Administrator, arts. She denies unusual headaches, visual changes, nausea, vomiting, or dizziness. There has been no unusual cough, phlegm production, or pleurisy. This been no change in bowel or bladder habits. She denies unexplained fatigue or unexplained weight loss, bleeding, rash, or fever. A detailed review of systems was otherwise stable.    BREAST CANCER HISTORY: From  doctor [name]'s intake node 07/24/2013:  "77 y.o. female. Who presented with SOB and had a CT chest performed that revealed a right breast mass. Mammogram/ultrsound on 2/13 showed a mass in the 11 o'clock position in the right breast measuring 1.5 cm. Also noted was a right axillary LN measuring 1.6 cm. MRI not performed. Biopsy of mass and lymph done. 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 Lymph node + for metastatic carcinoma."  [On 09/09/2013 the patient underwent right lumpectomy and sentinel lymph node sampling. This showed ([id number]) multifocal invasive ductal carcinoma, grade 1, the largest lesion measuring 1.8 cm, the second lesion 1.2 cm. One of 4 sentinel lymph nodes was positive, with extracapsular extension. Margins were positive. HER-2 was repeated and was again negative. Further surgery 09/16/2013 obtained clear margins.  Her subsequent history is as detailed below  PAST MEDICAL HISTORY: Past Medical History:  Diagnosis Date  . Allergy   . Anxiety   . Arthritis   . Blood transfusion without reported diagnosis   . Breast cancer ([place]) 07/12/2013   Invasive Mammary Carcinoma  . DVT (deep vein thrombosis) in pregnancy ([place])   . Hypertension   . Hypothyroid   . Personal history of radiation therapy   . Pneumonia   . Radiation 11/21/13-01/07/14   Right Breast/Supraclavicular    PAST SURGICAL HISTORY: Past Surgical History:  Procedure Laterality Date  . BREAST LUMPECTOMY Left 2015  . BREAST LUMPECTOMY WITH RADIOACTIVE SEED LOCALIZATION Right 09/09/2013   Procedure: BREAST LUMPECTOMY WITH RADIOACTIVE SEED LOCALIZATION WITH AXILLARY NODE EXCISION;  Surgeon: [name], MD;  Location: [facility];  Service: General;  Laterality: Right;  . DENTAL SURGERY  04/19/2012   13 TEETH REMOVED  . DILATION AND CURETTAGE OF UTERUS    . ORIF  PERIPROSTHETIC FRACTURE Left 01/31/2017   Procedure: REVISION and OPEN REDUCTION INTERNAL FIXATION (ORIF) PERIPROSTHETIC FRACTURE LEFT HIP;  Surgeon: Paralee Cancel, MD;  Location: [facility];  Service: Orthopedics;  Laterality: Left;  120 mins  . RE-EXCISION OF BREAST LUMPECTOMY Right 09/24/2013   Procedure: RE-EXCISION OF RIGHT BREAST LUMPECTOMY;  Surgeon: [name], MD;  Location: [facility];  Service: General;  Laterality: Right;  . TOTAL HIP ARTHROPLASTY Left 01/16/2017   Procedure: TOTAL HIP ARTHROPLASTY POSTERIOR;  Surgeon: Paralee Cancel, MD;  Location: [facility];  Service: Orthopedics;  Laterality: Left;    FAMILY HISTORY Family History  Problem Relation Age of Onset  . Heart disease Brother   . Colon cancer Brother   . Prostate cancer Brother    the patient's father died at the age of 72 after an automobile accident. The patient's mother died at the age of 61. She was a Marine scientist here in [name] in the old [facility]. She was infected with polio and was confined to a wheelchair for a good part of her life. She eventually died of pneumonia. The patient had one brother, who died with prostate cancer. She had no sisters. There is no history of breast or ovarian cancer in the family.  GYNECOLOGIC HISTORY:  Menarche age 73, first live birth age 18, the patient is GX P1. She went through the change of life at age 78. 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 She is widowed. Currently she is staying with her friend Alicia Vasquez,  11,who is a retired Radio producer. The patient's son Alicia Vasquez lives in [place]. He works an Engineer, technical sales. The patient has no grandchildren. She is a Tourist information centre manager but currently attends a [employer] with her friend Alicia Vasquez   ADVANCED DIRECTIVES: Not in place   HEALTH MAINTENANCE: Social History   Tobacco Use  . Smoking status: Never Smoker  . Smokeless tobacco: Never  Used  Substance Use Topics  . Alcohol use: No    Alcohol/week: 0.0 oz  . Drug use: No     Colonoscopy: Never  PAP:  Bone density: 09/20/2016 showed a T score of -2.2  Lipid panel:  Allergies  Allergen Reactions  . Anesthetics, Amide Hypertension  . Benadryl [Diphenhydramine Hcl] Other (See Comments)    Dizziness  . Carbocaine [Mepivacaine Hcl] Hypertension  . Codeine Other (See Comments)    Dizziness  . Epinephrine Hypertension  . Sulfa Antibiotics Other (See Comments)    dizziness  . Latex Other (See Comments)    Blisters in mouth  . Tramadol     Sedation.   Marland Kitchen Penicillins Rash    Has patient had a PCN reaction causing immediate rash, facial/tongue/throat swelling, SOB or lightheadedness with hypotension: Unknown Has patient had a PCN reaction causing severe rash involving mucus membranes or skin necrosis: Unknown Has patient had a PCN reaction that required hospitalization: Unknown Has patient had a PCN reaction occurring within the last 10 years: Unknown If all of the above answers are "NO", then may proceed with Cephalosporin use.     Current Outpatient Medications  Medication Sig Dispense Refill  . Cholecalciferol (VITAMIN D3) 5000 UNITS TABS Take 5,000 Units by mouth daily.     . clindamycin (CLEOCIN) 300  MG capsule Take 1 capsule (300 mg total) by mouth 3 (three) times daily. 30 capsule 0  . HYDROcodone-acetaminophen (NORCO/VICODIN) 5-325 MG tablet Take 1 tablet by mouth every 6 (six) hours as needed for moderate pain. 120 tablet 0  . levothyroxine (SYNTHROID) 125 MCG tablet Take 1 tablet (125 mcg total) by mouth daily before breakfast. Dispense as written: Synthroid LAST REFILL 90 tablet 3  . polyethylene glycol (MIRALAX / GLYCOLAX) packet Take 17 g by mouth 2 (two) times daily. 14 each 0  . predniSONE (DELTASONE) 5 MG tablet 6-5-4-3-2-1-off 21 tablet 0  . vitamin C (ASCORBIC ACID) 250 MG tablet Take 250 mg by mouth 3 (three) times daily.    Marland Kitchen warfarin (JANTOVEN) 5  MG tablet Take 1.5-2 tablets (7.5-10 mg total) by mouth one time only at 6 PM for 1 dose. Take 7.5 mg alternating with 10 mg po daily 90 tablet 4   No current facility-administered medications for this visit.     OBJECTIVE: Older white woman examined in a wheelchair  Vitals:   10/03/17 1442  BP: (!) 118/58  Pulse: 77  Resp: 18  Temp: 98.4 F (36.9 C)  SpO2: 97%     Body mass index is 36.94 kg/m.    ECOG FS: 2 - Symptomatic, <50% confined to bed  Sclerae unicteric, EOMs intact Oropharynx clear, moderately dry No cervical or supraclavicular adenopathy Lungs no rales or rhonchi Heart regular rate and rhythm Abd soft, obese, nontender, positive bowel sounds MSK no focal spinal tenderness, grade 2 bilateral lower extremity lymphedema Neuro: nonfocal, appropriate affect Breasts: Deferred  LAB RESULTS:  CMP     Component Value Date/Time   NA 141 09/22/2017 1555   NA 141 05/16/2017 1416   K 5.4 (H) 09/22/2017 1555   K 3.8 05/16/2017 1416   CL 107 09/22/2017 1555   CO2 28 09/22/2017 1555   CO2 27 05/16/2017 1416   GLUCOSE 99 09/22/2017 1555   GLUCOSE 140 05/16/2017 1416   BUN 16 09/22/2017 1555   BUN 13.2 05/16/2017 1416   CREATININE 0.80 09/22/2017 1555   CREATININE 0.8 05/16/2017 1416   CALCIUM 10.3 09/22/2017 1555   CALCIUM 11.0 (H) 05/16/2017 1416   PROT 6.4 09/22/2017 1555   PROT 6.9 05/16/2017 1416   ALBUMIN 3.6 09/05/2017 1027   ALBUMIN 3.6 05/16/2017 1416   AST 14 09/22/2017 1555   AST 13 05/16/2017 1416   ALT 13 09/22/2017 1555   ALT <6 05/16/2017 1416   ALKPHOS 85 09/05/2017 1027   ALKPHOS 130 05/16/2017 1416   BILITOT 0.4 09/22/2017 1555   BILITOT 0.31 05/16/2017 1416   GFRNONAA >60 09/05/2017 1027   GFRNONAA 75 08/19/2015 1602   GFRAA >60 09/05/2017 1027   GFRAA 87 08/19/2015 1602    I No results found for: SPEP  Lab Results  Component Value Date   WBC 7.1 09/22/2017   NEUTROABS 4,132 09/22/2017   HGB 12.9 09/22/2017   HCT 38.6 09/22/2017     MCV 82.7 09/22/2017   PLT 274 09/22/2017      Chemistry      Component Value Date/Time   NA 141 09/22/2017 1555   NA 141 05/16/2017 1416   K 5.4 (H) 09/22/2017 1555   K 3.8 05/16/2017 1416   CL 107 09/22/2017 1555   CO2 28 09/22/2017 1555   CO2 27 05/16/2017 1416   BUN 16 09/22/2017 1555   BUN 13.2 05/16/2017 1416   CREATININE 0.80 09/22/2017 1555   CREATININE 0.8 05/16/2017  1416      Component Value Date/Time   CALCIUM 10.3 09/22/2017 1555   CALCIUM 11.0 (H) 05/16/2017 1416   ALKPHOS 85 09/05/2017 1027   ALKPHOS 130 05/16/2017 1416   AST 14 09/22/2017 1555   AST 13 05/16/2017 1416   ALT 13 09/22/2017 1555   ALT <6 05/16/2017 1416   BILITOT 0.4 09/22/2017 1555   BILITOT 0.31 05/16/2017 1416       No results found for: LABCA2  No components found for: LABCA125  No results for input(s): INR in the last 168 hours.  Urinalysis    Component Value Date/Time   COLORURINE STRAW (A) 01/15/2017 2000   APPEARANCEUR CLEAR 01/15/2017 2000   LABSPEC 1.008 01/15/2017 2000   LABSPEC 1.010 09/22/2016 1553   PHURINE 6.0 01/15/2017 2000   GLUCOSEU NEGATIVE 01/15/2017 2000   GLUCOSEU Negative 09/22/2016 1553   HGBUR SMALL (A) 01/15/2017 2000   BILIRUBINUR NEGATIVE 01/15/2017 2000   BILIRUBINUR Negative 01/06/2017 1608   BILIRUBINUR Negative 09/22/2016 1553   KETONESUR NEGATIVE 01/15/2017 2000   PROTEINUR NEGATIVE 01/15/2017 2000   UROBILINOGEN 0.2 01/06/2017 1608   UROBILINOGEN 0.2 11/15/2016 1358   UROBILINOGEN 0.2 09/22/2016 1553   NITRITE NEGATIVE 01/15/2017 2000   LEUKOCYTESUR NEGATIVE 01/15/2017 2000   LEUKOCYTESUR Negative 09/22/2016 1553    STUDIES: Nm Pet Image Initial (pi) Skull Base To Thigh  Result Date: 09/12/2017 CLINICAL DATA:  Initial treatment strategy for right breast cancer. EXAM: NUCLEAR MEDICINE PET SKULL BASE TO THIGH TECHNIQUE: 10.4 mCi F-18 FDG was injected intravenously. Full-ring PET imaging was performed from the skull base to thigh after  the radiotracer. CT data was obtained and used for attenuation correction and anatomic localization. Fasting blood glucose: 92 mg/dl COMPARISON:  None. FINDINGS: Mediastinal blood pool activity: SUV max 2.4 NECK: No hypermetabolic lymph nodes in the neck. Incidental CT findings: none CHEST: Symmetric diffuse thyroid uptake identified without discrete mass or nodule evident. No hypermetabolic mediastinal or hilar nodes. No hypermetabolic axillary lymph nodes. No suspicious pulmonary nodules on the CT scan. Incidental CT findings: Calcified lymph nodes are seen in the right hilum with calcified granuloma identified in the right lower lobe. Postsurgical changes noted right breast. ABDOMEN/PELVIS: No abnormal hypermetabolic activity within the liver, pancreas, adrenal glands, or spleen. No hypermetabolic lymph nodes in the abdomen or pelvis. Incidental CT findings: Tiny hiatal hernia. Atherosclerotic calcification noted in the abdominal aorta diverticular changes noted left colon. SKELETON: Typical mottled marrow uptake with no focal hypermetabolic activity to suggest skeletal metastasis. Incidental CT findings: Status post left total hip replacement. IMPRESSION: 1. No evidence for hypermetabolic metastatic disease in the neck, chest, abdomen, or pelvis. 2. Symmetric diffuse uptake in the thyroid parenchyma. While nonspecific, this appearance can be seen in the setting of thyroiditis. 3. Tiny hiatal hernia. 4.  Aortic Atherosclerois (ICD10-170.0) Electronically Signed   By: Misty Stanley M.D.   On: 09/12/2017 16:29   Mm Diag Breast Tomo Bilateral  Result Date: 09/28/2017 CLINICAL DATA:  Patient presents for bilateral diagnostic examination due to a previous history of malignant right lumpectomy 2015. EXAM: DIGITAL DIAGNOSTIC BILATERAL MAMMOGRAM WITH CAD AND TOMO COMPARISON:  Previous exam(s). ACR Breast Density Category c: The breast tissue is heterogeneously dense, which may obscure small masses. FINDINGS:  Examination demonstrates stable post lumpectomy changes over the upper-outer quadrant of the right breast. Remainder of the exam is unchanged. Mammographic images were processed with CAD. IMPRESSION: Stable post lumpectomy changes of the right breast. RECOMMENDATION: Recommend continued annual bilateral diagnostic  mammographic evaluation. I have discussed the findings and recommendations with the patient. Results were also provided in writing at the conclusion of the visit. If applicable, a reminder letter will be sent to the patient regarding the next appointment. BI-RADS CATEGORY  2: Benign. Electronically Signed   By: Marin Olp M.D.   On: 09/28/2017 16:17    ASSESSMENT: 77 y.o. [place] woman status post right breast upper outer quadrant lumpectomy and sentinel lymph node sampling 09/09/2013 for an mpT1c pN1a, stage IIA invasive ductal carcinoma, estrogen and progesterone receptor both 100% positive with strong staining intensity, MIB-1 of 17% and no HER-2 amplification  (1) additional surgery for margin clearance 09/16/2013 obtained negative margins  (2) Oncotype DX recurrence score of 4 predicts a risk of outside the breast recurrence within 10 years of 7% if the patient's only systemic therapy is tamoxifen for 5 years. It also predicts no benefit from chemotherapy  (3) adjuvant radiation completed 01/07/2014  (4) anastrozole started 02/27/2014 stopped within 2 weeks because of arm swelling.   (a) bone density April 2016 showed osteopenia, with a t-score of -1.6  (b) anastrozole resumed 12/17/2015  (c) Bone density 09/20/2016 showed a T score of -2.2  (5) history of left lower extremity DVT 11/23/2012, initially on rivaroxaban, which caused chest pain, switch to Coumadin July 2014  (b) coumadin dose increased to 7.5/10 mg alternating days as of 06/13/2017   METASTATIC DISEASE: August 2018 (6) status post left total hip replacement 01/16/2017 for estrogen receptor positive  adenocarcinoma.  (a) CA 27-29 was 46.4 as of 04/18/2017  (b) chest CT scan 05/02/2017 shows no lung or liver lesion concern; it does show aortic atherosclerosis  (c) baseline bone scan 05/02/2017 was negative  (d) PET scan 09/12/2017 shows no active disease, including bone  (7) fulvestrant started 04/18/2017  (8) denosumab/Xgeva started 05/16/2017  (a) changed to every 12-weeks after 10/03/2017 dose   PLAN:  Traniya will soon be a year out from definitive diagnosis of metastatic breast cancer, currently with no evidence of active disease by PET scan.  This is very encouraging.  She is tolerating the fulvestrant without any side effects that she is aware of and we are continuing that indefinitely.  She is also on denosumab.  I am going to back that off to every 12 weeks to minimize concerns regarding osteonecrosis of the jaw especially as currently we see no evidence of active bone lesions.  We will continue to follow her INR on a monthly basis.  Currently her Coumadin dose is very stable.  She receives Norco for chronic pain related to her arthritis.Review of PMP aware shows the last prescription was written by [name] on 07/27/2017 and the patient always uses the same pharmacy.  She will see my 51 assistant in 3 months.  I will see her again in 6 months from now.  They know to call for any other issues that may develop before the next visit. Alicia Vasquez, [name], MD  10/03/17 3:15 PM Medical Oncology and Hematology [facility] [address] [place], [place] [address] Tel. [phone]    Fax. [phone]  This document serves as a record of services personally performed by Lurline Del, MD. It was created on his behalf by Sheron Nightingale, a trained medical scribe. The creation of this record is based on the scribe's personal observations and the provider's statements to them.   I have reviewed the above documentation for accuracy and completeness, and I agree with  the above.

## 2017-10-03 ENCOUNTER — Inpatient Hospital Stay (HOSPITAL_BASED_OUTPATIENT_CLINIC_OR_DEPARTMENT_OTHER): Payer: Medicare Other | Admitting: Oncology

## 2017-10-03 ENCOUNTER — Telehealth: Payer: Self-pay | Admitting: Oncology

## 2017-10-03 ENCOUNTER — Inpatient Hospital Stay: Payer: Medicare Other | Attending: Oncology

## 2017-10-03 ENCOUNTER — Inpatient Hospital Stay: Payer: Medicare Other

## 2017-10-03 DIAGNOSIS — G893 Neoplasm related pain (acute) (chronic): Secondary | ICD-10-CM

## 2017-10-03 DIAGNOSIS — I10 Essential (primary) hypertension: Secondary | ICD-10-CM

## 2017-10-03 DIAGNOSIS — E039 Hypothyroidism, unspecified: Secondary | ICD-10-CM | POA: Diagnosis not present

## 2017-10-03 DIAGNOSIS — F418 Other specified anxiety disorders: Secondary | ICD-10-CM | POA: Diagnosis not present

## 2017-10-03 DIAGNOSIS — Z885 Allergy status to narcotic agent status: Secondary | ICD-10-CM | POA: Diagnosis not present

## 2017-10-03 DIAGNOSIS — M899 Disorder of bone, unspecified: Secondary | ICD-10-CM

## 2017-10-03 DIAGNOSIS — M1990 Unspecified osteoarthritis, unspecified site: Secondary | ICD-10-CM

## 2017-10-03 DIAGNOSIS — Z7901 Long term (current) use of anticoagulants: Secondary | ICD-10-CM | POA: Diagnosis not present

## 2017-10-03 DIAGNOSIS — Z170 Estrogen receptor positive status [ER+]: Secondary | ICD-10-CM | POA: Insufficient documentation

## 2017-10-03 DIAGNOSIS — Z884 Allergy status to anesthetic agent status: Secondary | ICD-10-CM | POA: Insufficient documentation

## 2017-10-03 DIAGNOSIS — I700 Atherosclerosis of aorta: Secondary | ICD-10-CM

## 2017-10-03 DIAGNOSIS — Z880 Allergy status to penicillin: Secondary | ICD-10-CM | POA: Insufficient documentation

## 2017-10-03 DIAGNOSIS — C50411 Malignant neoplasm of upper-outer quadrant of right female breast: Secondary | ICD-10-CM | POA: Diagnosis not present

## 2017-10-03 DIAGNOSIS — C7951 Secondary malignant neoplasm of bone: Secondary | ICD-10-CM

## 2017-10-03 DIAGNOSIS — Z96642 Presence of left artificial hip joint: Secondary | ICD-10-CM | POA: Insufficient documentation

## 2017-10-03 DIAGNOSIS — Z888 Allergy status to other drugs, medicaments and biological substances status: Secondary | ICD-10-CM | POA: Diagnosis not present

## 2017-10-03 DIAGNOSIS — Z79890 Hormone replacement therapy (postmenopausal): Secondary | ICD-10-CM | POA: Insufficient documentation

## 2017-10-03 DIAGNOSIS — G8929 Other chronic pain: Secondary | ICD-10-CM | POA: Diagnosis not present

## 2017-10-03 DIAGNOSIS — Z923 Personal history of irradiation: Secondary | ICD-10-CM | POA: Diagnosis not present

## 2017-10-03 DIAGNOSIS — M84459A Pathological fracture, hip, unspecified, initial encounter for fracture: Secondary | ICD-10-CM

## 2017-10-03 DIAGNOSIS — Z79899 Other long term (current) drug therapy: Secondary | ICD-10-CM | POA: Insufficient documentation

## 2017-10-03 DIAGNOSIS — Z800 Family history of malignant neoplasm of digestive organs: Secondary | ICD-10-CM | POA: Diagnosis not present

## 2017-10-03 DIAGNOSIS — D6859 Other primary thrombophilia: Secondary | ICD-10-CM

## 2017-10-03 DIAGNOSIS — Z86718 Personal history of other venous thrombosis and embolism: Secondary | ICD-10-CM | POA: Diagnosis not present

## 2017-10-03 DIAGNOSIS — C039 Malignant neoplasm of gum, unspecified: Secondary | ICD-10-CM | POA: Diagnosis not present

## 2017-10-03 DIAGNOSIS — I82401 Acute embolism and thrombosis of unspecified deep veins of right lower extremity: Secondary | ICD-10-CM

## 2017-10-03 DIAGNOSIS — F419 Anxiety disorder, unspecified: Secondary | ICD-10-CM | POA: Diagnosis not present

## 2017-10-03 DIAGNOSIS — Z882 Allergy status to sulfonamides status: Secondary | ICD-10-CM | POA: Diagnosis not present

## 2017-10-03 DIAGNOSIS — Z8042 Family history of malignant neoplasm of prostate: Secondary | ICD-10-CM

## 2017-10-03 DIAGNOSIS — M8580 Other specified disorders of bone density and structure, unspecified site: Secondary | ICD-10-CM

## 2017-10-03 DIAGNOSIS — M84659P Pathological fracture in other disease, hip, unspecified, subsequent encounter for fracture with malunion: Secondary | ICD-10-CM

## 2017-10-03 DIAGNOSIS — Z8249 Family history of ischemic heart disease and other diseases of the circulatory system: Secondary | ICD-10-CM | POA: Diagnosis not present

## 2017-10-03 DIAGNOSIS — Z79818 Long term (current) use of other agents affecting estrogen receptors and estrogen levels: Secondary | ICD-10-CM

## 2017-10-03 DIAGNOSIS — M84559P Pathological fracture in neoplastic disease, hip, unspecified, subsequent encounter for fracture with malunion: Secondary | ICD-10-CM

## 2017-10-03 LAB — CBC WITH DIFFERENTIAL/PLATELET
Basophils Absolute: 0.1 10*3/uL (ref 0.0–0.1)
Basophils Relative: 1 %
EOS PCT: 4 %
Eosinophils Absolute: 0.3 10*3/uL (ref 0.0–0.5)
HCT: 39.4 % (ref 34.8–46.6)
Hemoglobin: 12.5 g/dL (ref 11.6–15.9)
LYMPHS ABS: 1.4 10*3/uL (ref 0.9–3.3)
LYMPHS PCT: 20 %
MCH: 28 pg (ref 25.1–34.0)
MCHC: 31.7 g/dL (ref 31.5–36.0)
MCV: 88.1 fL (ref 79.5–101.0)
MONOS PCT: 17 %
Monocytes Absolute: 1.2 10*3/uL — ABNORMAL HIGH (ref 0.1–0.9)
Neutro Abs: 4.1 10*3/uL (ref 1.5–6.5)
Neutrophils Relative %: 58 %
PLATELETS: 222 10*3/uL (ref 145–400)
RBC: 4.47 MIL/uL (ref 3.70–5.45)
RDW: 16.1 % — ABNORMAL HIGH (ref 11.2–14.5)
WBC: 7.1 10*3/uL (ref 3.9–10.3)

## 2017-10-03 LAB — COMPREHENSIVE METABOLIC PANEL
ALBUMIN: 3.6 g/dL (ref 3.5–5.0)
ALK PHOS: 86 U/L (ref 40–150)
ALT: 14 U/L (ref 0–55)
ANION GAP: 3 (ref 3–11)
AST: 13 U/L (ref 5–34)
BUN: 16 mg/dL (ref 7–26)
CALCIUM: 10.6 mg/dL — AB (ref 8.4–10.4)
CO2: 29 mmol/L (ref 22–29)
CREATININE: 0.78 mg/dL (ref 0.60–1.10)
Chloride: 108 mmol/L (ref 98–109)
GFR calc Af Amer: >60 mL/min (ref >60)
GFR calc non Af Amer: >60 mL/min (ref >60)
GLUCOSE: 95 mg/dL (ref 70–140)
Potassium: 4.9 mmol/L (ref 3.5–5.1)
SODIUM: 140 mmol/L (ref 136–145)
Total Bilirubin: 0.3 mg/dL (ref 0.2–1.2)
Total Protein: 6.6 g/dL (ref 6.4–8.3)

## 2017-10-03 LAB — PROTIME-INR
INR: 2.16
Prothrombin Time: 23.9 seconds — ABNORMAL HIGH (ref 11.4–15.2)

## 2017-10-03 MED ORDER — FULVESTRANT 250 MG/5ML IM SOLN
500.0000 mg | Freq: Once | INTRAMUSCULAR | Status: AC
  Administered 2017-10-03: 500 mg via INTRAMUSCULAR

## 2017-10-03 MED ORDER — DENOSUMAB 120 MG/1.7ML ~~LOC~~ SOLN
SUBCUTANEOUS | Status: AC
  Filled 2017-10-03: qty 1.7

## 2017-10-03 MED ORDER — DENOSUMAB 120 MG/1.7ML ~~LOC~~ SOLN
120.0000 mg | Freq: Once | SUBCUTANEOUS | Status: AC
  Administered 2017-10-03: 120 mg via SUBCUTANEOUS

## 2017-10-03 MED ORDER — HYDROCODONE-ACETAMINOPHEN 5-325 MG PO TABS: 1.0000 | ORAL_TABLET | Freq: Four times a day (QID) | ORAL | 0 refills | Status: DC | PRN

## 2017-10-03 MED ORDER — FULVESTRANT 250 MG/5ML IM SOLN
INTRAMUSCULAR | Status: AC
  Filled 2017-10-03: qty 10

## 2017-10-03 NOTE — Telephone Encounter (Signed)
Gave patient AVs and calendar of upcoming appointments.  °

## 2017-10-04 LAB — CANCER ANTIGEN 27.29: CA 27.29: 39.5 U/mL — ABNORMAL HIGH (ref 0.0–38.6)

## 2017-10-31 ENCOUNTER — Inpatient Hospital Stay: Payer: Medicare Other

## 2017-10-31 ENCOUNTER — Inpatient Hospital Stay: Payer: Medicare Other | Attending: Oncology

## 2017-10-31 DIAGNOSIS — D6859 Other primary thrombophilia: Secondary | ICD-10-CM

## 2017-10-31 DIAGNOSIS — Z7951 Long term (current) use of inhaled steroids: Secondary | ICD-10-CM | POA: Diagnosis not present

## 2017-10-31 DIAGNOSIS — G893 Neoplasm related pain (acute) (chronic): Secondary | ICD-10-CM

## 2017-10-31 DIAGNOSIS — Z923 Personal history of irradiation: Secondary | ICD-10-CM | POA: Diagnosis not present

## 2017-10-31 DIAGNOSIS — C50411 Malignant neoplasm of upper-outer quadrant of right female breast: Secondary | ICD-10-CM | POA: Insufficient documentation

## 2017-10-31 DIAGNOSIS — I82401 Acute embolism and thrombosis of unspecified deep veins of right lower extremity: Secondary | ICD-10-CM

## 2017-10-31 DIAGNOSIS — Z79818 Long term (current) use of other agents affecting estrogen receptors and estrogen levels: Secondary | ICD-10-CM | POA: Diagnosis not present

## 2017-10-31 DIAGNOSIS — M8580 Other specified disorders of bone density and structure, unspecified site: Secondary | ICD-10-CM

## 2017-10-31 DIAGNOSIS — M84459A Pathological fracture, hip, unspecified, initial encounter for fracture: Secondary | ICD-10-CM

## 2017-10-31 DIAGNOSIS — I700 Atherosclerosis of aorta: Secondary | ICD-10-CM

## 2017-10-31 DIAGNOSIS — Z170 Estrogen receptor positive status [ER+]: Secondary | ICD-10-CM | POA: Insufficient documentation

## 2017-10-31 DIAGNOSIS — M84659P Pathological fracture in other disease, hip, unspecified, subsequent encounter for fracture with malunion: Secondary | ICD-10-CM

## 2017-10-31 DIAGNOSIS — M899 Disorder of bone, unspecified: Secondary | ICD-10-CM

## 2017-10-31 DIAGNOSIS — C7951 Secondary malignant neoplasm of bone: Secondary | ICD-10-CM

## 2017-10-31 LAB — CBC WITH DIFFERENTIAL/PLATELET
Basophils Absolute: 0.1 10*3/uL (ref 0.0–0.1)
Basophils Relative: 1 %
EOS ABS: 0.2 10*3/uL (ref 0.0–0.5)
Eosinophils Relative: 3 %
HEMATOCRIT: 40.3 % (ref 34.8–46.6)
HEMOGLOBIN: 12.8 g/dL (ref 11.6–15.9)
LYMPHS ABS: 1.6 10*3/uL (ref 0.9–3.3)
LYMPHS PCT: 22 %
MCH: 28.4 pg (ref 25.1–34.0)
MCHC: 31.8 g/dL (ref 31.5–36.0)
MCV: 89.4 fL (ref 79.5–101.0)
Monocytes Absolute: 0.9 10*3/uL (ref 0.1–0.9)
Monocytes Relative: 12 %
NEUTROS ABS: 4.4 10*3/uL (ref 1.5–6.5)
NEUTROS PCT: 62 %
Platelets: 243 10*3/uL (ref 145–400)
RBC: 4.51 MIL/uL (ref 3.70–5.45)
RDW: 15.5 % — ABNORMAL HIGH (ref 11.2–14.5)
WBC: 7.1 10*3/uL (ref 3.9–10.3)

## 2017-10-31 LAB — COMPREHENSIVE METABOLIC PANEL
ALBUMIN: 3.8 g/dL (ref 3.5–5.0)
ALK PHOS: 83 U/L (ref 40–150)
ALT: 12 U/L (ref 0–55)
AST: 14 U/L (ref 5–34)
Anion gap: 7 (ref 3–11)
BILIRUBIN TOTAL: 0.3 mg/dL (ref 0.2–1.2)
BUN: 13 mg/dL (ref 7–26)
CALCIUM: 10.3 mg/dL (ref 8.4–10.4)
CO2: 27 mmol/L (ref 22–29)
CREATININE: 0.77 mg/dL (ref 0.60–1.10)
Chloride: 106 mmol/L (ref 98–109)
GFR calc non Af Amer: >60 mL/min (ref >60)
GLUCOSE: 94 mg/dL (ref 70–140)
Potassium: 4.5 mmol/L (ref 3.5–5.1)
SODIUM: 140 mmol/L (ref 136–145)
TOTAL PROTEIN: 7 g/dL (ref 6.4–8.3)

## 2017-10-31 LAB — PROTIME-INR
INR: 1.23
Prothrombin Time: 15.4 seconds — ABNORMAL HIGH (ref 11.4–15.2)

## 2017-10-31 MED ORDER — DENOSUMAB 120 MG/1.7ML ~~LOC~~ SOLN
120.0000 mg | Freq: Once | SUBCUTANEOUS | Status: DC
Start: 2017-10-31 — End: 2017-10-31

## 2017-10-31 MED ORDER — FULVESTRANT 250 MG/5ML IM SOLN
INTRAMUSCULAR | Status: AC
  Filled 2017-10-31: qty 10

## 2017-10-31 MED ORDER — DENOSUMAB 120 MG/1.7ML ~~LOC~~ SOLN
SUBCUTANEOUS | Status: AC
  Filled 2017-10-31: qty 1.7

## 2017-10-31 MED ORDER — FULVESTRANT 250 MG/5ML IM SOLN
500.0000 mg | Freq: Once | INTRAMUSCULAR | Status: AC
  Administered 2017-10-31: 500 mg via INTRAMUSCULAR

## 2017-10-31 NOTE — Patient Instructions (Signed)
Denosumab injection What is this medicine? DENOSUMAB (den oh sue mab) slows bone breakdown. Prolia is used to treat osteoporosis in women after menopause and in men. Delton See is used to treat a high calcium level due to cancer and to prevent bone fractures and other bone problems caused by multiple myeloma or cancer bone metastases. Delton See is also used to treat giant cell tumor of the bone. This medicine may be used for other purposes; ask your health care provider or pharmacist if you have questions. COMMON BRAND NAME(S): Prolia, XGEVA What should I tell my health care provider before I take this medicine? They need to know if you have any of these conditions: -dental disease -having surgery or tooth extraction -infection -kidney disease -low levels of calcium or Vitamin D in the blood -malnutrition -on hemodialysis -skin conditions or sensitivity -thyroid or parathyroid disease -an unusual reaction to denosumab, other medicines, foods, dyes, or preservatives -pregnant or trying to get pregnant -breast-feeding How should I use this medicine? This medicine is for injection under the skin. It is given by a health care professional in a hospital or clinic setting. If you are getting Prolia, a special MedGuide will be given to you by the pharmacist with each prescription and refill. Be sure to read this information carefully each time. For Prolia, talk to your pediatrician regarding the use of this medicine in children. Special care may be needed. For Delton See, talk to your pediatrician regarding the use of this medicine in children. While this drug may be prescribed for children as young as 13 years for selected conditions, precautions do apply. Overdosage: If you think you have taken too much of this medicine contact a poison control center or emergency room at once. NOTE: This medicine is only for you. Do not share this medicine with others. What if I miss a dose? It is important not to miss your  dose. Call your doctor or health care professional if you are unable to keep an appointment. What may interact with this medicine? Do not take this medicine with any of the following medications: -other medicines containing denosumab This medicine may also interact with the following medications: -medicines that lower your chance of fighting infection -steroid medicines like prednisone or cortisone This list may not describe all possible interactions. Give your health care provider a list of all the medicines, herbs, non-prescription drugs, or dietary supplements you use. Also tell them if you smoke, drink alcohol, or use illegal drugs. Some items may interact with your medicine. What should I watch for while using this medicine? Visit your doctor or health care professional for regular checks on your progress. Your doctor or health care professional may order blood tests and other tests to see how you are doing. Call your doctor or health care professional for advice if you get a fever, chills or sore throat, or other symptoms of a cold or flu. Do not treat yourself. This drug may decrease your body's ability to fight infection. Try to avoid being around people who are sick. You should make sure you get enough calcium and vitamin D while you are taking this medicine, unless your doctor tells you not to. Discuss the foods you eat and the vitamins you take with your health care professional. See your dentist regularly. Brush and floss your teeth as directed. Before you have any dental work done, tell your dentist you are receiving this medicine. Do not become pregnant while taking this medicine or for 5 months after stopping  it. Talk with your doctor or health care professional about your birth control options while taking this medicine. Women should inform their doctor if they wish to become pregnant or think they might be pregnant. There is a potential for serious side effects to an unborn child. Talk  to your health care professional or pharmacist for more information. What side effects may I notice from receiving this medicine? Side effects that you should report to your doctor or health care professional as soon as possible: -allergic reactions like skin rash, itching or hives, swelling of the face, lips, or tongue -bone pain -breathing problems -dizziness -jaw pain, especially after dental work -redness, blistering, peeling of the skin -signs and symptoms of infection like fever or chills; cough; sore throat; pain or trouble passing urine -signs of low calcium like fast heartbeat, muscle cramps or muscle pain; pain, tingling, numbness in the hands or feet; seizures -unusual bleeding or bruising -unusually weak or tired Side effects that usually do not require medical attention (report to your doctor or health care professional if they continue or are bothersome): -constipation -diarrhea -headache -joint pain -loss of appetite -muscle pain -runny nose -tiredness -upset stomach This list may not describe all possible side effects. Call your doctor for medical advice about side effects. You may report side effects to FDA at 1-800-FDA-1088. Where should I keep my medicine? This medicine is only given in a clinic, doctor's office, or other health care setting and will not be stored at home. NOTE: This sheet is a summary. It may not cover all possible information. If you have questions about this medicine, talk to your doctor, pharmacist, or health care provider.  2018 Elsevier/Gold Standard (2016-06-07 19:17:21)   Fulvestrant injection What is this medicine? FULVESTRANT (ful VES trant) blocks the effects of estrogen. It is used to treat breast cancer. This medicine may be used for other purposes; ask your health care provider or pharmacist if you have questions. COMMON BRAND NAME(S): FASLODEX What should I tell my health care provider before I take this medicine? They need to  know if you have any of these conditions: -bleeding problems -liver disease -low levels of platelets in the blood -an unusual or allergic reaction to fulvestrant, other medicines, foods, dyes, or preservatives -pregnant or trying to get pregnant -breast-feeding How should I use this medicine? This medicine is for injection into a muscle. It is usually given by a health care professional in a hospital or clinic setting. Talk to your pediatrician regarding the use of this medicine in children. Special care may be needed. Overdosage: If you think you have taken too much of this medicine contact a poison control center or emergency room at once. NOTE: This medicine is only for you. Do not share this medicine with others. What if I miss a dose? It is important not to miss your dose. Call your doctor or health care professional if you are unable to keep an appointment. What may interact with this medicine? -medicines that treat or prevent blood clots like warfarin, enoxaparin, and dalteparin This list may not describe all possible interactions. Give your health care provider a list of all the medicines, herbs, non-prescription drugs, or dietary supplements you use. Also tell them if you smoke, drink alcohol, or use illegal drugs. Some items may interact with your medicine. What should I watch for while using this medicine? Your condition will be monitored carefully while you are receiving this medicine. You will need important blood work done while you   this medicine. Do not become pregnant while taking this medicine or for at least 1 year after stopping it. Women of child-bearing potential will need to have a negative pregnancy test before starting this medicine. Women should inform their doctor if they wish to become pregnant or think they might be pregnant. There is a potential for serious side effects to an unborn child. Men should inform their doctors if they wish to father a child. This  medicine may lower sperm counts. Talk to your health care professional or pharmacist for more information. Do not breast-feed an infant while taking this medicine or for 1 year after the last dose. What side effects may I notice from receiving this medicine? Side effects that you should report to your doctor or health care professional as soon as possible: -allergic reactions like skin rash, itching or hives, swelling of the face, lips, or tongue -feeling faint or lightheaded, falls -pain, tingling, numbness, or weakness in the legs -signs and symptoms of infection like fever or chills; cough; flu-like symptoms; sore throat -vaginal bleeding Side effects that usually do not require medical attention (report to your doctor or health care professional if they continue or are bothersome): -aches, pains -constipation -diarrhea -headache -hot flashes -nausea, vomiting -pain at site where injected -stomach pain This list may not describe all possible side effects. Call your doctor for medical advice about side effects. You may report side effects to FDA at 1-800-FDA-1088. Where should I keep my medicine? This drug is given in a hospital or clinic and will not be stored at home. NOTE: This sheet is a summary. It may not cover all possible information. If you have questions about this medicine, talk to your doctor, pharmacist, or health care provider.  2018 Elsevier/Gold Standard (2014-12-12 11:03:55)

## 2017-11-01 LAB — CANCER ANTIGEN 27.29: CAN 27.29: 33.6 U/mL (ref 0.0–38.6)

## 2017-11-28 ENCOUNTER — Inpatient Hospital Stay: Payer: Medicare Other | Attending: Oncology

## 2017-11-28 ENCOUNTER — Inpatient Hospital Stay: Payer: Medicare Other

## 2017-11-28 VITALS — BP 150/54 | HR 66 | Temp 98.2°F | Resp 18

## 2017-11-28 DIAGNOSIS — I700 Atherosclerosis of aorta: Secondary | ICD-10-CM | POA: Insufficient documentation

## 2017-11-28 DIAGNOSIS — C50411 Malignant neoplasm of upper-outer quadrant of right female breast: Secondary | ICD-10-CM | POA: Insufficient documentation

## 2017-11-28 DIAGNOSIS — Z79899 Other long term (current) drug therapy: Secondary | ICD-10-CM | POA: Insufficient documentation

## 2017-11-28 DIAGNOSIS — Z170 Estrogen receptor positive status [ER+]: Secondary | ICD-10-CM

## 2017-11-28 DIAGNOSIS — Z96642 Presence of left artificial hip joint: Secondary | ICD-10-CM | POA: Diagnosis not present

## 2017-11-28 DIAGNOSIS — Z5111 Encounter for antineoplastic chemotherapy: Secondary | ICD-10-CM | POA: Diagnosis not present

## 2017-11-28 DIAGNOSIS — Z86718 Personal history of other venous thrombosis and embolism: Secondary | ICD-10-CM | POA: Diagnosis not present

## 2017-11-28 DIAGNOSIS — M8580 Other specified disorders of bone density and structure, unspecified site: Secondary | ICD-10-CM

## 2017-11-28 DIAGNOSIS — M899 Disorder of bone, unspecified: Secondary | ICD-10-CM

## 2017-11-28 DIAGNOSIS — G8929 Other chronic pain: Secondary | ICD-10-CM | POA: Insufficient documentation

## 2017-11-28 DIAGNOSIS — M84659P Pathological fracture in other disease, hip, unspecified, subsequent encounter for fracture with malunion: Secondary | ICD-10-CM

## 2017-11-28 DIAGNOSIS — D6859 Other primary thrombophilia: Secondary | ICD-10-CM

## 2017-11-28 DIAGNOSIS — C7951 Secondary malignant neoplasm of bone: Secondary | ICD-10-CM

## 2017-11-28 DIAGNOSIS — Z923 Personal history of irradiation: Secondary | ICD-10-CM | POA: Diagnosis not present

## 2017-11-28 DIAGNOSIS — I82401 Acute embolism and thrombosis of unspecified deep veins of right lower extremity: Secondary | ICD-10-CM

## 2017-11-28 DIAGNOSIS — G893 Neoplasm related pain (acute) (chronic): Secondary | ICD-10-CM

## 2017-11-28 DIAGNOSIS — Z79811 Long term (current) use of aromatase inhibitors: Secondary | ICD-10-CM | POA: Diagnosis not present

## 2017-11-28 DIAGNOSIS — M84459A Pathological fracture, hip, unspecified, initial encounter for fracture: Secondary | ICD-10-CM

## 2017-11-28 DIAGNOSIS — Z7901 Long term (current) use of anticoagulants: Secondary | ICD-10-CM | POA: Insufficient documentation

## 2017-11-28 LAB — COMPREHENSIVE METABOLIC PANEL
ALT: 13 U/L (ref 0–44)
AST: 10 U/L — AB (ref 15–41)
Albumin: 3.8 g/dL (ref 3.5–5.0)
Alkaline Phosphatase: 100 U/L (ref 38–126)
Anion gap: 5 (ref 5–15)
BUN: 19 mg/dL (ref 8–23)
CHLORIDE: 106 mmol/L (ref 98–111)
CO2: 28 mmol/L (ref 22–32)
CREATININE: 0.73 mg/dL (ref 0.44–1.00)
Calcium: 10.6 mg/dL — ABNORMAL HIGH (ref 8.9–10.3)
GFR calc Af Amer: >60 mL/min (ref >60)
Glucose, Bld: 97 mg/dL (ref 70–99)
Potassium: 4.1 mmol/L (ref 3.5–5.1)
SODIUM: 139 mmol/L (ref 135–145)
Total Bilirubin: <0.2 mg/dL — ABNORMAL LOW (ref 0.3–1.2)
Total Protein: 6.8 g/dL (ref 6.5–8.1)

## 2017-11-28 LAB — CBC WITH DIFFERENTIAL/PLATELET
Basophils Absolute: 0.1 10*3/uL (ref 0.0–0.1)
Basophils Relative: 1 %
Eosinophils Absolute: 0.2 10*3/uL (ref 0.0–0.5)
Eosinophils Relative: 3 %
HEMATOCRIT: 38.5 % (ref 34.8–46.6)
Hemoglobin: 12.5 g/dL (ref 11.6–15.9)
LYMPHS PCT: 16 %
Lymphs Abs: 1 10*3/uL (ref 0.9–3.3)
MCH: 27.8 pg (ref 25.1–34.0)
MCHC: 32.5 g/dL (ref 31.5–36.0)
MCV: 85.5 fL (ref 79.5–101.0)
MONO ABS: 0.8 10*3/uL (ref 0.1–0.9)
MONOS PCT: 14 %
NEUTROS ABS: 4.1 10*3/uL (ref 1.5–6.5)
Neutrophils Relative %: 66 %
Platelets: 236 10*3/uL (ref 145–400)
RBC: 4.5 MIL/uL (ref 3.70–5.45)
RDW: 16 % — AB (ref 11.2–14.5)
WBC: 6.2 10*3/uL (ref 3.9–10.3)

## 2017-11-28 LAB — PROTIME-INR
INR: 2.25
Prothrombin Time: 24.7 seconds — ABNORMAL HIGH (ref 11.4–15.2)

## 2017-11-28 MED ORDER — FULVESTRANT 250 MG/5ML IM SOLN
500.0000 mg | Freq: Once | INTRAMUSCULAR | Status: AC
  Administered 2017-11-28: 500 mg via INTRAMUSCULAR

## 2017-11-28 MED ORDER — DENOSUMAB 120 MG/1.7ML ~~LOC~~ SOLN
120.0000 mg | Freq: Once | SUBCUTANEOUS | Status: DC
Start: 2017-11-28 — End: 2017-11-28

## 2017-11-28 MED ORDER — FULVESTRANT 250 MG/5ML IM SOLN
INTRAMUSCULAR | Status: AC
  Filled 2017-11-28: qty 5

## 2017-11-28 MED ORDER — DENOSUMAB 120 MG/1.7ML ~~LOC~~ SOLN
SUBCUTANEOUS | Status: AC
  Filled 2017-11-28: qty 1.7

## 2017-11-28 NOTE — Patient Instructions (Signed)

## 2017-11-29 LAB — CANCER ANTIGEN 27.29: CAN 27.29: 38.4 U/mL (ref 0.0–38.6)

## 2017-12-26 ENCOUNTER — Inpatient Hospital Stay: Payer: Medicare Other

## 2017-12-26 ENCOUNTER — Encounter: Payer: Self-pay | Admitting: Adult Health

## 2017-12-26 ENCOUNTER — Inpatient Hospital Stay (HOSPITAL_BASED_OUTPATIENT_CLINIC_OR_DEPARTMENT_OTHER): Payer: Medicare Other | Admitting: Adult Health

## 2017-12-26 ENCOUNTER — Other Ambulatory Visit: Payer: Self-pay | Admitting: Hematology and Oncology

## 2017-12-26 VITALS — BP 126/61 | HR 76 | Temp 97.7°F | Resp 18 | Ht 64.0 in | Wt 218.9 lb

## 2017-12-26 DIAGNOSIS — C50411 Malignant neoplasm of upper-outer quadrant of right female breast: Secondary | ICD-10-CM

## 2017-12-26 DIAGNOSIS — D6859 Other primary thrombophilia: Secondary | ICD-10-CM

## 2017-12-26 DIAGNOSIS — Z170 Estrogen receptor positive status [ER+]: Secondary | ICD-10-CM

## 2017-12-26 DIAGNOSIS — Z79811 Long term (current) use of aromatase inhibitors: Secondary | ICD-10-CM

## 2017-12-26 DIAGNOSIS — G8929 Other chronic pain: Secondary | ICD-10-CM | POA: Diagnosis not present

## 2017-12-26 DIAGNOSIS — Z86718 Personal history of other venous thrombosis and embolism: Secondary | ICD-10-CM

## 2017-12-26 DIAGNOSIS — Z923 Personal history of irradiation: Secondary | ICD-10-CM

## 2017-12-26 DIAGNOSIS — M84659P Pathological fracture in other disease, hip, unspecified, subsequent encounter for fracture with malunion: Secondary | ICD-10-CM

## 2017-12-26 DIAGNOSIS — I700 Atherosclerosis of aorta: Secondary | ICD-10-CM

## 2017-12-26 DIAGNOSIS — Z96642 Presence of left artificial hip joint: Secondary | ICD-10-CM

## 2017-12-26 DIAGNOSIS — C7951 Secondary malignant neoplasm of bone: Secondary | ICD-10-CM

## 2017-12-26 DIAGNOSIS — M8580 Other specified disorders of bone density and structure, unspecified site: Secondary | ICD-10-CM

## 2017-12-26 DIAGNOSIS — G893 Neoplasm related pain (acute) (chronic): Secondary | ICD-10-CM

## 2017-12-26 DIAGNOSIS — Z5111 Encounter for antineoplastic chemotherapy: Secondary | ICD-10-CM | POA: Diagnosis not present

## 2017-12-26 DIAGNOSIS — Z79899 Other long term (current) drug therapy: Secondary | ICD-10-CM

## 2017-12-26 DIAGNOSIS — M84459A Pathological fracture, hip, unspecified, initial encounter for fracture: Secondary | ICD-10-CM

## 2017-12-26 DIAGNOSIS — Z7901 Long term (current) use of anticoagulants: Secondary | ICD-10-CM

## 2017-12-26 DIAGNOSIS — M84559P Pathological fracture in neoplastic disease, hip, unspecified, subsequent encounter for fracture with malunion: Secondary | ICD-10-CM

## 2017-12-26 DIAGNOSIS — M899 Disorder of bone, unspecified: Secondary | ICD-10-CM

## 2017-12-26 DIAGNOSIS — I82401 Acute embolism and thrombosis of unspecified deep veins of right lower extremity: Secondary | ICD-10-CM

## 2017-12-26 LAB — COMPREHENSIVE METABOLIC PANEL
ALT: 13 U/L (ref 0–44)
AST: 11 U/L — AB (ref 15–41)
Albumin: 3.8 g/dL (ref 3.5–5.0)
Alkaline Phosphatase: 89 U/L (ref 38–126)
Anion gap: 8 (ref 5–15)
BILIRUBIN TOTAL: 0.3 mg/dL (ref 0.3–1.2)
BUN: 14 mg/dL (ref 8–23)
CALCIUM: 10.6 mg/dL — AB (ref 8.9–10.3)
CO2: 29 mmol/L (ref 22–32)
Chloride: 105 mmol/L (ref 98–111)
Creatinine, Ser: 0.79 mg/dL (ref 0.44–1.00)
GFR calc Af Amer: >60 mL/min (ref >60)
GFR calc non Af Amer: >60 mL/min (ref >60)
Glucose, Bld: 129 mg/dL — ABNORMAL HIGH (ref 70–99)
Potassium: 5.1 mmol/L (ref 3.5–5.1)
Sodium: 142 mmol/L (ref 135–145)
TOTAL PROTEIN: 7 g/dL (ref 6.5–8.1)

## 2017-12-26 LAB — CBC WITH DIFFERENTIAL/PLATELET
BASOS PCT: 1 %
Basophils Absolute: 0 10*3/uL (ref 0.0–0.1)
EOS PCT: 3 %
Eosinophils Absolute: 0.2 10*3/uL (ref 0.0–0.5)
HCT: 40.7 % (ref 34.8–46.6)
Hemoglobin: 12.9 g/dL (ref 11.6–15.9)
Lymphocytes Relative: 18 %
Lymphs Abs: 1.3 10*3/uL (ref 0.9–3.3)
MCH: 28.5 pg (ref 25.1–34.0)
MCHC: 31.7 g/dL (ref 31.5–36.0)
MCV: 90 fL (ref 79.5–101.0)
Monocytes Absolute: 0.7 10*3/uL (ref 0.1–0.9)
Monocytes Relative: 10 %
Neutro Abs: 4.7 10*3/uL (ref 1.5–6.5)
Neutrophils Relative %: 68 %
Platelets: 234 10*3/uL (ref 145–400)
RBC: 4.52 MIL/uL (ref 3.70–5.45)
RDW: 15.1 % — ABNORMAL HIGH (ref 11.2–14.5)
WBC: 6.9 10*3/uL (ref 3.9–10.3)

## 2017-12-26 LAB — PROTIME-INR
INR: 2.08
Prothrombin Time: 23.2 seconds — ABNORMAL HIGH (ref 11.4–15.2)

## 2017-12-26 MED ORDER — DENOSUMAB 120 MG/1.7ML ~~LOC~~ SOLN
120.0000 mg | Freq: Once | SUBCUTANEOUS | Status: AC
  Administered 2017-12-26: 120 mg via SUBCUTANEOUS

## 2017-12-26 MED ORDER — FULVESTRANT 250 MG/5ML IM SOLN
500.0000 mg | Freq: Once | INTRAMUSCULAR | Status: AC
  Administered 2017-12-26: 500 mg via INTRAMUSCULAR

## 2017-12-26 MED ORDER — DENOSUMAB 120 MG/1.7ML ~~LOC~~ SOLN
SUBCUTANEOUS | Status: AC
  Filled 2017-12-26: qty 1.7

## 2017-12-26 MED ORDER — HYDROCODONE-ACETAMINOPHEN 5-325 MG PO TABS: 1.0000 | ORAL_TABLET | Freq: Four times a day (QID) | ORAL | 0 refills | Status: DC | PRN

## 2017-12-26 MED ORDER — FULVESTRANT 250 MG/5ML IM SOLN
INTRAMUSCULAR | Status: AC
  Filled 2017-12-26: qty 5

## 2017-12-26 NOTE — Patient Instructions (Signed)
Denosumab injection What is this medicine? DENOSUMAB (den oh sue mab) slows bone breakdown. Prolia is used to treat osteoporosis in women after menopause and in men. Delton See is used to treat a high calcium level due to cancer and to prevent bone fractures and other bone problems caused by multiple myeloma or cancer bone metastases. Delton See is also used to treat giant cell tumor of the bone. This medicine may be used for other purposes; ask your health care provider or pharmacist if you have questions. COMMON BRAND NAME(S): Prolia, XGEVA What should I tell my health care provider before I take this medicine? They need to know if you have any of these conditions: -dental disease -having surgery or tooth extraction -infection -kidney disease -low levels of calcium or Vitamin D in the blood -malnutrition -on hemodialysis -skin conditions or sensitivity -thyroid or parathyroid disease -an unusual reaction to denosumab, other medicines, foods, dyes, or preservatives -pregnant or trying to get pregnant -breast-feeding How should I use this medicine? This medicine is for injection under the skin. It is given by a health care professional in a hospital or clinic setting. If you are getting Prolia, a special MedGuide will be given to you by the pharmacist with each prescription and refill. Be sure to read this information carefully each time. For Prolia, talk to your pediatrician regarding the use of this medicine in children. Special care may be needed. For Delton See, talk to your pediatrician regarding the use of this medicine in children. While this drug may be prescribed for children as young as 13 years for selected conditions, precautions do apply. Overdosage: If you think you have taken too much of this medicine contact a poison control center or emergency room at once. NOTE: This medicine is only for you. Do not share this medicine with others. What if I miss a dose? It is important not to miss your  dose. Call your doctor or health care professional if you are unable to keep an appointment. What may interact with this medicine? Do not take this medicine with any of the following medications: -other medicines containing denosumab This medicine may also interact with the following medications: -medicines that lower your chance of fighting infection -steroid medicines like prednisone or cortisone This list may not describe all possible interactions. Give your health care provider a list of all the medicines, herbs, non-prescription drugs, or dietary supplements you use. Also tell them if you smoke, drink alcohol, or use illegal drugs. Some items may interact with your medicine. What should I watch for while using this medicine? Visit your doctor or health care professional for regular checks on your progress. Your doctor or health care professional may order blood tests and other tests to see how you are doing. Call your doctor or health care professional for advice if you get a fever, chills or sore throat, or other symptoms of a cold or flu. Do not treat yourself. This drug may decrease your body's ability to fight infection. Try to avoid being around people who are sick. You should make sure you get enough calcium and vitamin D while you are taking this medicine, unless your doctor tells you not to. Discuss the foods you eat and the vitamins you take with your health care professional. See your dentist regularly. Brush and floss your teeth as directed. Before you have any dental work done, tell your dentist you are receiving this medicine. Do not become pregnant while taking this medicine or for 5 months after stopping  it. Talk with your doctor or health care professional about your birth control options while taking this medicine. Women should inform their doctor if they wish to become pregnant or think they might be pregnant. There is a potential for serious side effects to an unborn child. Talk  to your health care professional or pharmacist for more information. What side effects may I notice from receiving this medicine? Side effects that you should report to your doctor or health care professional as soon as possible: -allergic reactions like skin rash, itching or hives, swelling of the face, lips, or tongue -bone pain -breathing problems -dizziness -jaw pain, especially after dental work -redness, blistering, peeling of the skin -signs and symptoms of infection like fever or chills; cough; sore throat; pain or trouble passing urine -signs of low calcium like fast heartbeat, muscle cramps or muscle pain; pain, tingling, numbness in the hands or feet; seizures -unusual bleeding or bruising -unusually weak or tired Side effects that usually do not require medical attention (report to your doctor or health care professional if they continue or are bothersome): -constipation -diarrhea -headache -joint pain -loss of appetite -muscle pain -runny nose -tiredness -upset stomach This list may not describe all possible side effects. Call your doctor for medical advice about side effects. You may report side effects to FDA at 1-800-FDA-1088. Where should I keep my medicine? This medicine is only given in a clinic, doctor's office, or other health care setting and will not be stored at home. NOTE: This sheet is a summary. It may not cover all possible information. If you have questions about this medicine, talk to your doctor, pharmacist, or health care provider.  2018 Elsevier/Gold Standard (2016-06-07 19:17:21)   Fulvestrant injection What is this medicine? FULVESTRANT (ful VES trant) blocks the effects of estrogen. It is used to treat breast cancer. This medicine may be used for other purposes; ask your health care provider or pharmacist if you have questions. COMMON BRAND NAME(S): FASLODEX What should I tell my health care provider before I take this medicine? They need to  know if you have any of these conditions: -bleeding problems -liver disease -low levels of platelets in the blood -an unusual or allergic reaction to fulvestrant, other medicines, foods, dyes, or preservatives -pregnant or trying to get pregnant -breast-feeding How should I use this medicine? This medicine is for injection into a muscle. It is usually given by a health care professional in a hospital or clinic setting. Talk to your pediatrician regarding the use of this medicine in children. Special care may be needed. Overdosage: If you think you have taken too much of this medicine contact a poison control center or emergency room at once. NOTE: This medicine is only for you. Do not share this medicine with others. What if I miss a dose? It is important not to miss your dose. Call your doctor or health care professional if you are unable to keep an appointment. What may interact with this medicine? -medicines that treat or prevent blood clots like warfarin, enoxaparin, and dalteparin This list may not describe all possible interactions. Give your health care provider a list of all the medicines, herbs, non-prescription drugs, or dietary supplements you use. Also tell them if you smoke, drink alcohol, or use illegal drugs. Some items may interact with your medicine. What should I watch for while using this medicine? Your condition will be monitored carefully while you are receiving this medicine. You will need important blood work done while you   this medicine. Do not become pregnant while taking this medicine or for at least 1 year after stopping it. Women of child-bearing potential will need to have a negative pregnancy test before starting this medicine. Women should inform their doctor if they wish to become pregnant or think they might be pregnant. There is a potential for serious side effects to an unborn child. Men should inform their doctors if they wish to father a child. This  medicine may lower sperm counts. Talk to your health care professional or pharmacist for more information. Do not breast-feed an infant while taking this medicine or for 1 year after the last dose. What side effects may I notice from receiving this medicine? Side effects that you should report to your doctor or health care professional as soon as possible: -allergic reactions like skin rash, itching or hives, swelling of the face, lips, or tongue -feeling faint or lightheaded, falls -pain, tingling, numbness, or weakness in the legs -signs and symptoms of infection like fever or chills; cough; flu-like symptoms; sore throat -vaginal bleeding Side effects that usually do not require medical attention (report to your doctor or health care professional if they continue or are bothersome): -aches, pains -constipation -diarrhea -headache -hot flashes -nausea, vomiting -pain at site where injected -stomach pain This list may not describe all possible side effects. Call your doctor for medical advice about side effects. You may report side effects to FDA at 1-800-FDA-1088. Where should I keep my medicine? This drug is given in a hospital or clinic and will not be stored at home. NOTE: This sheet is a summary. It may not cover all possible information. If you have questions about this medicine, talk to your doctor, pharmacist, or health care provider.  2018 Elsevier/Gold Standard (2014-12-12 11:03:55)

## 2017-12-26 NOTE — Progress Notes (Signed)
[place]  [phone] Fax:([phone]) [phone]     ID: Alicia Vasquez OB: 1940/08/23  MR#: [id number]  WCB#:[id number]  PCP: Briscoe Deutscher, DO GYN:   SU: [name] OTHER MD: Thea Silversmith, Hart Robinsons, Philemon Kingdom  CHIEF COMPLAINT: Estrogen receptor positive breast cancer  CURRENT TREATMENT:  Fulvestrant and denosumab/Xgeva   INTERVAL HISTORY: Alicia Vasquez is here today for follow up and treatment of her metastatic estrogen receptor positive breast cancer accompanied by her friend. Alicia Vasquez receives fulvestrant every 4 weeks. She tolerates these well other than some pain at the injection site.  She also receives denosumab/Xgeva every 12 weeks.  She denies any dental issues.    REVIEW OF SYSTEMS: Alicia Vasquez has pain in her lower back that is controlled with Norco.  She denies any increase in pain.  She continues to take coumadin alternating between 25m and 7.573m  She tolerates this well.  She denies any easy bruising/bleeding.  She denies any new issues today.  She continues to have issues with pain, this pain is not any worse since when she was last seen by Dr. MaJana Hakim She wants to know if I will refill her synthroid.  She also thinks she has some allergies/hoarseness and wants to know what to do about that.  Otherwise, a detailed ROS is non contributory.     BREAST CANCER HISTORY: From doctor Alicia Vasquez's intake node 07/24/2013:  "[age].o. female. Who presented with SOB and had a CT chest performed that revealed a right breast mass. Mammogram/ultrsound on 2/13 showed a mass in the 11 o'clock position in the right breast measuring 1.5 cm. Also noted was a right axillary LN measuring 1.6 cm. MRI not performed. Biopsy of mass and lymph done. Mass pathology [[id number] 07/11/2013] invasive mammary carcinoma with mammary carcinoma in situ, grade I, ER+ 100%, PR+ 100% her2neu-, Ki-67 17%. Lymph node + for metastatic carcinoma."  [On 09/09/2013 the  patient underwent right lumpectomy and sentinel lymph node sampling. This showed (SZA [id number] invasive ductal carcinoma, grade 1, the largest lesion measuring 1.8 cm, the second lesion 1.2 cm. One of 4 sentinel lymph nodes was positive, with extracapsular extension. Margins were positive. HER-2 was repeated and was again negative. Further surgery 09/16/2013 obtained clear margins.  Her subsequent history is as detailed below  PAST MEDICAL HISTORY: Past Medical History:  Diagnosis Date  . Allergy   . Anxiety   . Arthritis   . Blood transfusion without reported diagnosis   . [place]   Invasive Mammary Carcinoma  . DVT (deep vein thrombosis) in [place] ([facility]  . Hypertension   . Hypothyroid   . Personal history of radiation therapy   . Pneumonia   . Radiation 11/21/13-01/07/14   Right Breast/Supraclavicular    PAST SURGICAL HISTORY: Past Surgical History:  Procedure Laterality Date  . BREAST LUMPECTOMY Left 2015  . BREAST LUMPECTOMY WITH RADIOACTIVE SEED LOCALIZATION Right 09/09/2013   Procedure: BREAST LUMPECTOMY WITH RADIOACTIVE SEED LOCALIZATION WITH AXILLARY NODE EXCISION;  Surgeon: MaRolm BookbinderMD;  [facility] Service: General;  Laterality: Right;  . DENTAL SURGERY  04/19/2012   13 TEETH REMOVED  . DILATION AND CURETTAGE OF UTERUS    . ORIF PERIPROSTHETIC FRACTURE Left 01/31/2017   Procedure: REVISION and OPEN REDUCTION INTERNAL FIXATION (ORIF) PERIPROSTHETIC FRACTURE LEFT HIP;  Surgeon: OlParalee CancelMD;  Location: [facility];  Service: Orthopedics;  Laterality: Left;  120 mins  . RE-EXCISION OF BREAST  LUMPECTOMY Right 09/24/2013   Procedure: RE-EXCISION OF RIGHT BREAST LUMPECTOMY;  Surgeon: [name], MD;  Location: [place];  Service: General;  Laterality: Right;  . TOTAL HIP ARTHROPLASTY Left 01/16/2017   Procedure: TOTAL HIP ARTHROPLASTY POSTERIOR;  Surgeon: Paralee Cancel, MD;  Location: [facility];  Service: Orthopedics;  Laterality: Left;    FAMILY HISTORY Family History  Problem Relation Age of Onset  . Heart disease Brother   . Colon cancer Brother   . Prostate cancer Brother    the patient's father died at the age of 61 after an automobile accident. The patient's mother died at the age of 4. She was a Marine scientist here in [name] in the old [facility]. She was infected with polio and was confined to a wheelchair for a good part of her life. She eventually died of pneumonia. The patient had one brother, who died with prostate cancer. She had no sisters. There is no history of breast or ovarian cancer in the family.  GYNECOLOGIC HISTORY:  Menarche age 59, first live birth age 66, the patient is GX P1. She went through the change of life at age 9. 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 She is widowed. Currently she is staying with her friend Alicia Vasquez,  85,who is a retired Radio producer. The patient's son Alicia Vasquez lives in [place]. He works an Engineer, technical sales. The patient has no grandchildren. She is a Tourist information centre manager but currently attends a [employer] with her friend Alicia Vasquez   ADVANCED DIRECTIVES: Not in place   HEALTH MAINTENANCE: Social History   Tobacco Use  . Smoking status: Never Smoker  . Smokeless tobacco: Never Used  Substance Use Topics  . Alcohol use: No    Alcohol/week: 0.0 oz  . Drug use: No     Colonoscopy: Never  PAP:  Bone density: 09/20/2016 showed a T score of -2.2  Lipid panel:  Allergies  Allergen Reactions  . Anesthetics, Amide Hypertension  . Benadryl [Diphenhydramine Hcl] Other (See Comments)    Dizziness  . Carbocaine [Mepivacaine Hcl] Hypertension  . Codeine Other (See Comments)    Dizziness  . Epinephrine Hypertension  . Sulfa Antibiotics Other (See Comments)    dizziness  . Latex Other (See Comments)    Blisters in mouth  . Tramadol     Sedation.     Marland Kitchen Penicillins Rash    Has patient had a PCN reaction causing immediate rash, facial/tongue/throat swelling, SOB or lightheadedness with hypotension: Unknown Has patient had a PCN reaction causing severe rash involving mucus membranes or skin necrosis: Unknown Has patient had a PCN reaction that required hospitalization: Unknown Has patient had a PCN reaction occurring within the last 10 years: Unknown If all of the above answers are "NO", then may proceed with Cephalosporin use.     Current Outpatient Medications  Medication Sig Dispense Refill  . Cholecalciferol (VITAMIN D3) 5000 UNITS TABS Take 5,000 Units by mouth daily.     Marland Kitchen HYDROcodone-acetaminophen (NORCO/VICODIN) 5-325 MG tablet Take 1 tablet by mouth every 6 (six) hours as needed for moderate pain. 120 tablet 0  . levothyroxine (SYNTHROID) 125 MCG tablet Take 1 tablet (125 mcg total) by mouth daily before breakfast. Dispense as written: Synthroid LAST REFILL 90 tablet 3  . polyethylene glycol (MIRALAX / GLYCOLAX) packet Take 17 g by mouth 2 (two) times daily. (Patient taking differently: Take 17 g by mouth as  needed. ) 14 each 0  . vitamin C (ASCORBIC ACID) 250 MG tablet Take 250 mg by mouth 3 (three) times daily.    Marland Kitchen warfarin (JANTOVEN) 5 MG tablet Take 1.5-2 tablets (7.5-10 mg total) by mouth one time only at 6 PM for 1 dose. Take 7.5 mg alternating with 10 mg po daily 90 tablet 4   No current facility-administered medications for this visit.     OBJECTIVE:   Vitals:   12/26/17 1400  BP: 126/61  Pulse: 76  Resp: 18  Temp: 97.7 F (36.5 C)  SpO2: 98%     Body mass index is 37.57 kg/m.    ECOG FS: 2 - Symptomatic, <50% confined to bed GENERAL: Patient is a chronically ill appearing female in no acute distress sitting in wheelchair HEENT:  Sclerae anicteric.  Oropharynx clear and moist. No ulcerations or evidence of oropharyngeal candidiasis. Neck is supple.  NODES:  No cervical, supraclavicular, or axillary  lymphadenopathy palpated.  BREAST EXAM:  Deferred. LUNGS:  Clear to auscultation bilaterally.  No wheezes or rhonchi. HEART:  Regular rate and rhythm. No murmur appreciated. ABDOMEN:  Soft, nontender.  Positive, normoactive bowel sounds. No organomegaly palpated. MSK:  No focal spinal tenderness to palpation. Full range of motion bilaterally in the upper extremities. EXTREMITIES:  No peripheral edema.   SKIN:  Clear with no obvious rashes or skin changes. No nail dyscrasia. NEURO:  Nonfocal. Well oriented.  Appropriate affect.    LAB RESULTS:  CMP     Component Value Date/Time   NA 142 12/26/2017 1343   NA 141 05/16/2017 1416   K 5.1 12/26/2017 1343   K 3.8 05/16/2017 1416   CL 105 12/26/2017 1343   CO2 29 12/26/2017 1343   CO2 27 05/16/2017 1416   GLUCOSE 129 (H) 12/26/2017 1343   GLUCOSE 140 05/16/2017 1416   BUN 14 12/26/2017 1343   BUN 13.2 05/16/2017 1416   CREATININE 0.79 12/26/2017 1343   CREATININE 0.80 09/22/2017 1555   CREATININE 0.8 05/16/2017 1416   CALCIUM 10.6 (H) 12/26/2017 1343   CALCIUM 11.0 (H) 05/16/2017 1416   PROT 7.0 12/26/2017 1343   PROT 6.9 05/16/2017 1416   ALBUMIN 3.8 12/26/2017 1343   ALBUMIN 3.6 05/16/2017 1416   AST 11 (L) 12/26/2017 1343   AST 13 05/16/2017 1416   ALT 13 12/26/2017 1343   ALT <6 05/16/2017 1416   ALKPHOS 89 12/26/2017 1343   ALKPHOS 130 05/16/2017 1416   BILITOT 0.3 12/26/2017 1343   BILITOT 0.31 05/16/2017 1416   GFRNONAA >60 12/26/2017 1343   GFRNONAA 75 08/19/2015 1602   GFRAA >60 12/26/2017 1343   GFRAA 87 08/19/2015 1602    I No results found for: SPEP  Lab Results  Component Value Date   WBC 6.9 12/26/2017   NEUTROABS 4.7 12/26/2017   HGB 12.9 12/26/2017   HCT 40.7 12/26/2017   MCV 90.0 12/26/2017   PLT 234 12/26/2017      Chemistry      Component Value Date/Time   NA 142 12/26/2017 1343   NA 141 05/16/2017 1416   K 5.1 12/26/2017 1343   K 3.8 05/16/2017 1416   CL 105 12/26/2017 1343   CO2 29  12/26/2017 1343   CO2 27 05/16/2017 1416   BUN 14 12/26/2017 1343   BUN 13.2 05/16/2017 1416   CREATININE 0.79 12/26/2017 1343   CREATININE 0.80 09/22/2017 1555   CREATININE 0.8 05/16/2017 1416      Component Value Date/Time  CALCIUM 10.6 (H) 12/26/2017 1343   CALCIUM 11.0 (H) 05/16/2017 1416   ALKPHOS 89 12/26/2017 1343   ALKPHOS 130 05/16/2017 1416   AST 11 (L) 12/26/2017 1343   AST 13 05/16/2017 1416   ALT 13 12/26/2017 1343   ALT <6 05/16/2017 1416   BILITOT 0.3 12/26/2017 1343   BILITOT 0.31 05/16/2017 1416       No results found for: LABCA2  No components found for: HCWCB762  No results for input(s): INR in the last 168 hours.  Urinalysis    Component Value Date/Time   COLORURINE STRAW (A) 01/15/2017 2000   APPEARANCEUR CLEAR 01/15/2017 2000   LABSPEC 1.008 01/15/2017 2000   LABSPEC 1.010 09/22/2016 1553   PHURINE 6.0 01/15/2017 2000   GLUCOSEU NEGATIVE 01/15/2017 2000   GLUCOSEU Negative 09/22/2016 1553   HGBUR SMALL (A) 01/15/2017 2000   BILIRUBINUR NEGATIVE 01/15/2017 2000   BILIRUBINUR Negative 01/06/2017 1608   BILIRUBINUR Negative 09/22/2016 1553   KETONESUR NEGATIVE 01/15/2017 2000   PROTEINUR NEGATIVE 01/15/2017 2000   UROBILINOGEN 0.2 01/06/2017 1608   UROBILINOGEN 0.2 11/15/2016 1358   UROBILINOGEN 0.2 09/22/2016 1553   NITRITE NEGATIVE 01/15/2017 2000   LEUKOCYTESUR NEGATIVE 01/15/2017 2000   LEUKOCYTESUR Negative 09/22/2016 1553    STUDIES: No results found.  ASSESSMENT: 77 y.o. [place] woman status post right breast upper outer quadrant lumpectomy and sentinel lymph node sampling 09/09/2013 for an mpT1c pN1a, stage IIA invasive ductal carcinoma, estrogen and progesterone receptor both 100% positive with strong staining intensity, MIB-1 of 17% and no HER-2 amplification  (1) additional surgery for margin clearance 09/16/2013 obtained negative margins  (2) Oncotype DX recurrence score of 4 predicts a risk of outside the breast  recurrence within 10 years of 7% if the patient's only systemic therapy is tamoxifen for 5 years. It also predicts no benefit from chemotherapy  (3) adjuvant radiation completed 01/07/2014  (4) anastrozole started 02/27/2014 stopped within 2 weeks because of arm swelling.   (a) bone density April 2016 showed osteopenia, with a t-score of -1.6  (b) anastrozole resumed 12/17/2015  (c) Bone density 09/20/2016 showed a T score of -2.2  (5) history of left lower extremity DVT 11/23/2012, initially on rivaroxaban, which caused chest pain, switch to Coumadin July 2014  (b) coumadin dose increased to 7.5/10 mg alternating days as of 06/13/2017   METASTATIC DISEASE: August 2018 (6) status post left total hip replacement 01/16/2017 for estrogen receptor positive adenocarcinoma.  (a) CA 27-29 was 46.4 as of 04/18/2017  (b) chest CT scan 05/02/2017 shows no lung or liver lesion concern; it does show aortic atherosclerosis  (c) baseline bone scan 05/02/2017 was negative  (d) PET scan 09/12/2017 shows no active disease, including bone  (7) fulvestrant started 04/18/2017  (8) denosumab/Xgeva started 05/16/2017  (a) changed to every 12-weeks after 10/03/2017 dose   PLAN:  Daily is doing well today.  She is tolerating her treatment well.  She has no signs of progression.  She will continue this and receive Fulvestrant and Xgeva today.  She will return every 4 weeks for fulvestrant and get the fulvestrant every 12.   She has f/u with Dr. Jana Hakim in 3 months.  I will review her case with him when he returns from the beach to determine when he would like restaging scans.    She continues on Norco for her chronic pain, #120 refilled today. Her pain remains stable.  PMP aware reviewed.  Dr. Jana Hakim is not here today.  Reviewed pain regimen  with Dr. Lindi Adie and it is appropriate.  Her INR remains stable and she will remain on her same dosage.    They know to call for any other issues that may develop before  the next visit.  A total of (30) minutes of face-to-face time was spent with this patient with greater than 50% of that time in counseling and care-coordination.   Wilber Bihari, NP 12/26/17 2:32 PM Medical Oncology and Hematology [facility] [address] [place], [place] [address] Tel. [phone]    Fax. [phone]

## 2017-12-27 ENCOUNTER — Telehealth: Payer: Self-pay | Admitting: Adult Health

## 2017-12-27 LAB — CANCER ANTIGEN 27.29: CAN 27.29: 42.1 U/mL — AB (ref 0.0–38.6)

## 2017-12-27 NOTE — Telephone Encounter (Signed)
Per 7/30 no los

## 2018-01-15 DIAGNOSIS — C50919 Malignant neoplasm of unspecified site of unspecified female breast: Secondary | ICD-10-CM | POA: Diagnosis not present

## 2018-01-23 ENCOUNTER — Inpatient Hospital Stay: Payer: Medicare Other | Attending: Oncology

## 2018-01-23 ENCOUNTER — Inpatient Hospital Stay: Payer: Medicare Other

## 2018-01-23 DIAGNOSIS — Z170 Estrogen receptor positive status [ER+]: Secondary | ICD-10-CM

## 2018-01-23 DIAGNOSIS — G893 Neoplasm related pain (acute) (chronic): Secondary | ICD-10-CM

## 2018-01-23 DIAGNOSIS — Z923 Personal history of irradiation: Secondary | ICD-10-CM | POA: Insufficient documentation

## 2018-01-23 DIAGNOSIS — I82401 Acute embolism and thrombosis of unspecified deep veins of right lower extremity: Secondary | ICD-10-CM

## 2018-01-23 DIAGNOSIS — Z79818 Long term (current) use of other agents affecting estrogen receptors and estrogen levels: Secondary | ICD-10-CM | POA: Insufficient documentation

## 2018-01-23 DIAGNOSIS — M8580 Other specified disorders of bone density and structure, unspecified site: Secondary | ICD-10-CM | POA: Diagnosis not present

## 2018-01-23 DIAGNOSIS — C50411 Malignant neoplasm of upper-outer quadrant of right female breast: Secondary | ICD-10-CM

## 2018-01-23 DIAGNOSIS — M899 Disorder of bone, unspecified: Secondary | ICD-10-CM

## 2018-01-23 DIAGNOSIS — C7951 Secondary malignant neoplasm of bone: Secondary | ICD-10-CM

## 2018-01-23 DIAGNOSIS — I700 Atherosclerosis of aorta: Secondary | ICD-10-CM

## 2018-01-23 DIAGNOSIS — M84459A Pathological fracture, hip, unspecified, initial encounter for fracture: Secondary | ICD-10-CM

## 2018-01-23 DIAGNOSIS — M84659P Pathological fracture in other disease, hip, unspecified, subsequent encounter for fracture with malunion: Secondary | ICD-10-CM

## 2018-01-23 DIAGNOSIS — D6859 Other primary thrombophilia: Secondary | ICD-10-CM

## 2018-01-23 LAB — PROTIME-INR
INR: 3.1
PROTHROMBIN TIME: 31.7 s — AB (ref 11.4–15.2)

## 2018-01-23 LAB — CBC WITH DIFFERENTIAL/PLATELET
BASOS PCT: 1 %
Basophils Absolute: 0.1 10*3/uL (ref 0.0–0.1)
EOS PCT: 2 %
Eosinophils Absolute: 0.2 10*3/uL (ref 0.0–0.5)
HCT: 40.7 % (ref 34.8–46.6)
HEMOGLOBIN: 13.1 g/dL (ref 11.6–15.9)
Lymphocytes Relative: 15 %
Lymphs Abs: 1.1 10*3/uL (ref 0.9–3.3)
MCH: 28.5 pg (ref 25.1–34.0)
MCHC: 32.2 g/dL (ref 31.5–36.0)
MCV: 88.7 fL (ref 79.5–101.0)
Monocytes Absolute: 0.7 10*3/uL (ref 0.1–0.9)
Monocytes Relative: 9 %
NEUTROS PCT: 73 %
Neutro Abs: 5.4 10*3/uL (ref 1.5–6.5)
PLATELETS: 251 10*3/uL (ref 145–400)
RBC: 4.59 MIL/uL (ref 3.70–5.45)
RDW: 15.3 % — ABNORMAL HIGH (ref 11.2–14.5)
WBC: 7.4 10*3/uL (ref 3.9–10.3)

## 2018-01-23 LAB — COMPREHENSIVE METABOLIC PANEL
ALBUMIN: 3.7 g/dL (ref 3.5–5.0)
ALK PHOS: 89 U/L (ref 38–126)
ALT: 16 U/L (ref 0–44)
ANION GAP: 8 (ref 5–15)
AST: 14 U/L — ABNORMAL LOW (ref 15–41)
BUN: 14 mg/dL (ref 8–23)
CHLORIDE: 104 mmol/L (ref 98–111)
CO2: 28 mmol/L (ref 22–32)
CREATININE: 0.86 mg/dL (ref 0.44–1.00)
Calcium: 10.9 mg/dL — ABNORMAL HIGH (ref 8.9–10.3)
GFR calc Af Amer: >60 mL/min (ref >60)
GFR calc non Af Amer: >60 mL/min (ref >60)
GLUCOSE: 130 mg/dL — AB (ref 70–99)
Potassium: 4.5 mmol/L (ref 3.5–5.1)
SODIUM: 140 mmol/L (ref 135–145)
Total Bilirubin: 0.3 mg/dL (ref 0.3–1.2)
Total Protein: 7 g/dL (ref 6.5–8.1)

## 2018-01-23 MED ORDER — FULVESTRANT 250 MG/5ML IM SOLN
500.0000 mg | Freq: Once | INTRAMUSCULAR | Status: AC
  Administered 2018-01-23: 500 mg via INTRAMUSCULAR

## 2018-01-23 NOTE — Patient Instructions (Signed)

## 2018-01-24 LAB — CANCER ANTIGEN 27.29: CA 27.29: 39.8 U/mL — ABNORMAL HIGH (ref 0.0–38.6)

## 2018-02-19 ENCOUNTER — Telehealth: Payer: Self-pay | Admitting: *Deleted

## 2018-02-19 NOTE — Telephone Encounter (Signed)
This RN spoke with pt per her call stating she is scheduled for her injections at this office tomorrow and is hoping to see someone due to " bug bite over the weekend on my breast and now the area is big and red "  Note pt had Right breast cancer at diagnosis now with metastatic disease .  This RN requested an appointment with Symptom Management for 02/20/2018

## 2018-02-20 ENCOUNTER — Inpatient Hospital Stay: Payer: Medicare Other

## 2018-02-20 ENCOUNTER — Ambulatory Visit (HOSPITAL_COMMUNITY)
Admission: RE | Admit: 2018-02-20 | Discharge: 2018-02-20 | Disposition: A | Discharge status: Home / Self Care | Payer: Medicare Other | Source: Ambulatory Visit | Attending: Medical | Admitting: Medical

## 2018-02-20 ENCOUNTER — Inpatient Hospital Stay: Payer: Medicare Other | Attending: Oncology | Admitting: Medical

## 2018-02-20 ENCOUNTER — Inpatient Hospital Stay (HOSPITAL_BASED_OUTPATIENT_CLINIC_OR_DEPARTMENT_OTHER): Payer: Medicare Other | Admitting: Medical

## 2018-02-20 VITALS — BP 146/69 | HR 74 | Temp 98.0°F | Resp 18 | Ht 64.0 in | Wt 219.0 lb

## 2018-02-20 DIAGNOSIS — G893 Neoplasm related pain (acute) (chronic): Secondary | ICD-10-CM

## 2018-02-20 DIAGNOSIS — R439 Unspecified disturbances of smell and taste: Secondary | ICD-10-CM | POA: Insufficient documentation

## 2018-02-20 DIAGNOSIS — Z79899 Other long term (current) drug therapy: Secondary | ICD-10-CM

## 2018-02-20 DIAGNOSIS — M84659P Pathological fracture in other disease, hip, unspecified, subsequent encounter for fracture with malunion: Secondary | ICD-10-CM

## 2018-02-20 DIAGNOSIS — Z170 Estrogen receptor positive status [ER+]: Secondary | ICD-10-CM | POA: Diagnosis not present

## 2018-02-20 DIAGNOSIS — C50411 Malignant neoplasm of upper-outer quadrant of right female breast: Secondary | ICD-10-CM

## 2018-02-20 DIAGNOSIS — Z923 Personal history of irradiation: Secondary | ICD-10-CM | POA: Diagnosis not present

## 2018-02-20 DIAGNOSIS — Z9221 Personal history of antineoplastic chemotherapy: Secondary | ICD-10-CM | POA: Diagnosis not present

## 2018-02-20 DIAGNOSIS — Z96642 Presence of left artificial hip joint: Secondary | ICD-10-CM | POA: Insufficient documentation

## 2018-02-20 DIAGNOSIS — C7951 Secondary malignant neoplasm of bone: Secondary | ICD-10-CM

## 2018-02-20 DIAGNOSIS — I10 Essential (primary) hypertension: Secondary | ICD-10-CM

## 2018-02-20 DIAGNOSIS — Z86718 Personal history of other venous thrombosis and embolism: Secondary | ICD-10-CM | POA: Insufficient documentation

## 2018-02-20 DIAGNOSIS — M8580 Other specified disorders of bone density and structure, unspecified site: Secondary | ICD-10-CM

## 2018-02-20 DIAGNOSIS — M899 Disorder of bone, unspecified: Secondary | ICD-10-CM

## 2018-02-20 DIAGNOSIS — Z79818 Long term (current) use of other agents affecting estrogen receptors and estrogen levels: Secondary | ICD-10-CM | POA: Diagnosis not present

## 2018-02-20 DIAGNOSIS — M84459A Pathological fracture, hip, unspecified, initial encounter for fracture: Secondary | ICD-10-CM

## 2018-02-20 DIAGNOSIS — R300 Dysuria: Secondary | ICD-10-CM

## 2018-02-20 DIAGNOSIS — Z9225 Personal history of immunosupression therapy: Secondary | ICD-10-CM | POA: Insufficient documentation

## 2018-02-20 DIAGNOSIS — M1990 Unspecified osteoarthritis, unspecified site: Secondary | ICD-10-CM | POA: Insufficient documentation

## 2018-02-20 DIAGNOSIS — I82401 Acute embolism and thrombosis of unspecified deep veins of right lower extremity: Secondary | ICD-10-CM

## 2018-02-20 DIAGNOSIS — M25552 Pain in left hip: Secondary | ICD-10-CM | POA: Insufficient documentation

## 2018-02-20 DIAGNOSIS — F419 Anxiety disorder, unspecified: Secondary | ICD-10-CM | POA: Insufficient documentation

## 2018-02-20 DIAGNOSIS — D6859 Other primary thrombophilia: Secondary | ICD-10-CM

## 2018-02-20 DIAGNOSIS — E039 Hypothyroidism, unspecified: Secondary | ICD-10-CM | POA: Diagnosis not present

## 2018-02-20 DIAGNOSIS — M84559P Pathological fracture in neoplastic disease, hip, unspecified, subsequent encounter for fracture with malunion: Secondary | ICD-10-CM

## 2018-02-20 DIAGNOSIS — R197 Diarrhea, unspecified: Secondary | ICD-10-CM | POA: Diagnosis not present

## 2018-02-20 DIAGNOSIS — I700 Atherosclerosis of aorta: Secondary | ICD-10-CM

## 2018-02-20 LAB — CBC WITH DIFFERENTIAL/PLATELET
BASOS ABS: 0.1 10*3/uL (ref 0.0–0.1)
BASOS PCT: 1 %
Eosinophils Absolute: 0.2 10*3/uL (ref 0.0–0.5)
Eosinophils Relative: 3 %
HCT: 38.9 % (ref 34.8–46.6)
HEMOGLOBIN: 12.6 g/dL (ref 11.6–15.9)
LYMPHS PCT: 13 %
Lymphs Abs: 0.9 10*3/uL (ref 0.9–3.3)
MCH: 28 pg (ref 25.1–34.0)
MCHC: 32.4 g/dL (ref 31.5–36.0)
MCV: 86.3 fL (ref 79.5–101.0)
MONO ABS: 0.8 10*3/uL (ref 0.1–0.9)
Monocytes Relative: 11 %
NEUTROS ABS: 5.2 10*3/uL (ref 1.5–6.5)
NEUTROS PCT: 72 %
Platelets: 230 10*3/uL (ref 145–400)
RBC: 4.51 MIL/uL (ref 3.70–5.45)
RDW: 15.8 % — AB (ref 11.2–14.5)
WBC: 7.2 10*3/uL (ref 3.9–10.3)

## 2018-02-20 LAB — URINALYSIS, COMPLETE (UACMP) WITH MICROSCOPIC
BILIRUBIN URINE: NEGATIVE
Glucose, UA: NEGATIVE mg/dL
HGB URINE DIPSTICK: NEGATIVE
KETONES UR: NEGATIVE mg/dL
Leukocytes, UA: NEGATIVE
Nitrite: NEGATIVE
PROTEIN: NEGATIVE mg/dL
Specific Gravity, Urine: 1.009 (ref 1.005–1.030)
pH: 6 (ref 5.0–8.0)

## 2018-02-20 LAB — PROTIME-INR
INR: 2.55
Prothrombin Time: 27.3 seconds — ABNORMAL HIGH (ref 11.4–15.2)

## 2018-02-20 MED ORDER — FULVESTRANT 250 MG/5ML IM SOLN
INTRAMUSCULAR | Status: AC
  Filled 2018-02-20: qty 10

## 2018-02-20 MED ORDER — HYDROCODONE-ACETAMINOPHEN 5-325 MG PO TABS: 1.0000 | ORAL_TABLET | Freq: Four times a day (QID) | ORAL | 0 refills | Status: DC | PRN

## 2018-02-20 MED ORDER — FULVESTRANT 250 MG/5ML IM SOLN
500.0000 mg | Freq: Once | INTRAMUSCULAR | Status: AC
  Administered 2018-02-20: 500 mg via INTRAMUSCULAR

## 2018-02-20 NOTE — Telephone Encounter (Signed)
Yes- she is on Van's for today prior to her shot- thanks Val

## 2018-02-20 NOTE — Progress Notes (Signed)
These results were called to [facility]. A message was left for her. she was told to call if there were questions.  Sandi Mealy, MHS, PA-C

## 2018-02-20 NOTE — Telephone Encounter (Signed)
Can we please place on Van's schedule?  If not, please add onto mine.    Thanks,  [place]

## 2018-02-21 ENCOUNTER — Telehealth: Payer: Self-pay

## 2018-02-21 LAB — CANCER ANTIGEN 27.29: CA 27.29: 31.8 U/mL (ref 0.0–38.6)

## 2018-02-21 NOTE — Progress Notes (Signed)
These results were called to [facility]. A message was left for her. she was told to call if there were questions.  Sandi Mealy, MHS, PA-C

## 2018-02-21 NOTE — Telephone Encounter (Signed)
lvm for pt to return call so we can review lab work, confirm warfarin dose and nex appt

## 2018-02-21 NOTE — Telephone Encounter (Signed)
Spoke with pt by phone to review blood work.   Pt confirms that her warfarin dose is 7.5mg  alternating with 10mg  nightly.

## 2018-02-23 ENCOUNTER — Other Ambulatory Visit: Payer: Self-pay | Admitting: Medical

## 2018-02-23 LAB — URINE CULTURE: Culture: 60000 — AB

## 2018-02-23 MED ORDER — CIPROFLOXACIN HCL 500 MG PO TABS: 500.0000 mg | ORAL_TABLET | Freq: Two times a day (BID) | ORAL | 0 refills | Status: DC

## 2018-02-23 NOTE — Progress Notes (Signed)
[facility] Progress Note   ARRINGTON BENCOMO [id number] 1940/08/31 77 y.o.  Lonie Peak Echeverri is managed by Dr. 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 - Plan: HYDROcodone-acetaminophen (NORCO/VICODIN) 5-325 MG tablet  Left hip pain - Plan: DG Hip Unilat W or W/O Pelvis 1 View Left  Malignant neoplasm of upper-outer quadrant of right breast in female, estrogen receptor positive ([place])   Dysuria: A urinalysis returned showing no strong evidence of a urinary tract infection.  A urine culture is pending.  History of a pathologic fracture of the left hip with malunion: Ms. Dory was given a refill of hydrocodone.  Left hip pain: The patient was referred for an x-ray of her left hip.  ER positive metastatic breast cancer: The patient continues to be followed by Dr. Jana Hakim and continues to receive Xgeva and Faslodex.  She is scheduled to see him in follow-up on 03/21/2018.  Please see After Visit Summary for patient specific instructions.  Future Appointments  Date Time Provider [place]  03/21/2018  2:00 PM [facility] None  03/21/2018  2:30 PM Magrinat, [name], MD [facility] None  03/21/2018  3:00 PM [place] [facility] None  04/18/2018  2:00 PM [facility] None  04/18/2018  2:30 PM [facility] [place] [facility] None  05/16/2018  2:00 PM [facility] 3 [facility] None  05/16/2018  2:30 PM [facility] [place] [facility] None  06/13/2018  2:00 PM [facility] None  06/13/2018  2:30 PM [facility] [place] [facility] None  06/14/2018  3:00 PM Philemon Kingdom, MD [facility] None  07/11/2018  2:00 PM [facility]  ONLY [facility] None  07/11/2018  2:30 PM [facility] [place] [facility] None    Orders Placed This Encounter  Procedures  . Urine Culture  . DG Hip Unilat W or W/O Pelvis 1 View Left  . Urinalysis, Complete w Microscopic       Subjective:   Patient ID:  CHIANN GOFFREDO is a 77 y.o. (DOB 03-22-1941) female.  Chief Complaint: No chief complaint on file.   HPI Inetha Maret Clutter is Sommers is a 77 year old female with a history of an ER positive metastatic breast cancer who is managed by Dr. Jana Hakim and is currently receiving Xgeva and Faslodex.  She has a history of a pathologic fracture of the left hip with malunion.  She presents to the clinic today with a report of a bug bite along her left chest wall which occurred 4 weeks ago.  She continues to have an area of hyperpigmentation at that spot.  She received her Faslodex injection today and reports that she could taste the medicine in her mouth and had pain in her left neck upon getting the shot in her bilateral buttocks.  She also reports that her urine smells foul after having the shot.  She is having mild dysuria.  She denies cloudy urine.  She has had diarrhea since Saturday but it is getting better.  She has not had to take any medications for this.  She reports having left hip pain.  She is concerned that she could have a problem where she has had surgery on her left hip with a hip replacement for a pathologic fracture.  Medications:  I have reviewed the patient's current medications.  Allergies:  Allergies  Allergen Reactions  . Anesthetics, [place]ide Hypertension  . Benadryl [Diphenhydramine Hcl] Other (See Comments)    Dizziness  . Carbocaine [Mepivacaine Hcl] Hypertension  . Codeine Other (See Comments)    Dizziness  . Epinephrine Hypertension  . Sulfa Antibiotics Other (See Comments)    dizziness  . Latex Other (See Comments)    Blisters in mouth  . Tramadol     Sedation.   Marland Kitchen Penicillins Rash    Has patient had a PCN  reaction causing immediate rash, facial/tongue/throat swelling, SOB or lightheadedness with hypotension: Unknown Has patient had a PCN reaction causing severe rash involving mucus membranes or skin necrosis: Unknown Has patient had a PCN reaction that required hospitalization: Unknown Has patient had a PCN reaction occurring within the last 10 years: Unknown If all of the above answers are "NO", then may proceed with Cephalosporin use.     Past Medical History:  Diagnosis Date  . Allergy   . Anxiety   . Arthritis   . Blood transfusion without reported diagnosis   . Breast cancer ([place]) 07/12/2013   Invasive Mammary Carcinoma  . DVT (deep vein thrombosis) in pregnancy ([place])   . Hypertension   . Hypothyroid   . Personal history of radiation therapy   . Pneumonia   . Radiation 11/21/13-01/07/14   Right Breast/Supraclavicular    Past Surgical History:  Procedure Laterality Date  . BREAST LUMPECTOMY Left 2015  . BREAST LUMPECTOMY WITH RADIOACTIVE SEED LOCALIZATION Right 09/09/2013   Procedure: BREAST LUMPECTOMY WITH RADIOACTIVE SEED LOCALIZATION WITH AXILLARY NODE EXCISION;  Surgeon: [name], MD;  Location: [facility];  Service: General;  Laterality: Right;  . DENTAL SURGERY  04/19/2012   13 TEETH REMOVED  . DILATION AND CURETTAGE OF UTERUS    . ORIF PERIPROSTHETIC FRACTURE Left 01/31/2017   Procedure: REVISION and OPEN REDUCTION INTERNAL FIXATION (ORIF) PERIPROSTHETIC FRACTURE LEFT HIP;  Surgeon: Paralee Cancel, MD;  Location: [facility];  Service: Orthopedics;  Laterality: Left;  120 mins  . RE-EXCISION OF BREAST LUMPECTOMY Right 09/24/2013   Procedure: RE-EXCISION OF RIGHT BREAST LUMPECTOMY;  Surgeon: [name], MD;  Location: [facility];  Service: General;  Laterality: Right;  . TOTAL HIP ARTHROPLASTY Left 01/16/2017   Procedure: TOTAL HIP ARTHROPLASTY POSTERIOR;  Surgeon: Paralee Cancel, MD;  Location: [facility];  Service: Orthopedics;  Laterality:  Left;    Family History  Problem Relation Age of Onset  . Heart disease Brother   . Colon cancer Brother   . Prostate cancer Brother     Social History   Socioeconomic History  . Marital status: Widowed    Spouse name: Not on file  . Number of children: 1  . Years of education: Not on file  . Highest education level: Not on file  Occupational History  . Not on file  Social Needs  . Financial resource strain: Not on file  . Food insecurity:    Worry: Not on file    Inability: Not on file  . Transportation needs:    Medical: Not on file    Non-medical: Not on file  Tobacco Use  . Smoking status: Never Smoker  . Smokeless tobacco: Never Used  Substance and Sexual Activity  . Alcohol use: No    Alcohol/week: 0.0 standard drinks  . Drug use: No  . Sexual activity: Never  Lifestyle  . Physical activity:    Days per week:  Not on file    Minutes per session: Not on file  . Stress: Not on file  Relationships  . Social connections:    Talks on phone: Not on file    Gets together: Not on file    Attends religious service: Not on file    Active member of club or organization: Not on file    Attends meetings of clubs or organizations: Not on file    Relationship status: Not on file  . Intimate partner violence:    Fear of current or ex partner: Not on file    Emotionally abused: Not on file    Physically abused: Not on file    Forced sexual activity: Not on file  Other Topics Concern  . Not on file  Social History Narrative   Exercise: yard work.    Past Medical History, Surgical history, Social history, and Family history were reviewed and updated as appropriate.   Please see review of systems for further details on the patient's review from today.   Review of Systems:  Review of Systems  Constitutional: Negative for appetite change, chills, diaphoresis, fever and unexpected weight change.  HENT:       Strange taste in the mouth after receiving a Faslodex shot.   Respiratory: Negative for cough, chest tightness and shortness of breath.   Cardiovascular: Negative for chest pain, palpitations and leg swelling.  Gastrointestinal: Positive for diarrhea. Negative for abdominal distention, abdominal pain, anal bleeding, blood in stool, constipation, nausea, rectal pain and vomiting.  Genitourinary: Positive for dysuria. Negative for decreased urine volume and difficulty urinating.       Urine odor after receiving Faslodex shot.  Musculoskeletal: Positive for arthralgias and neck pain.  Skin:       Bug bite to the left chest wall 4 weeks ago with an area of hyperpigmentation at that site.  Neurological: Negative for weakness.    Objective:   Physical Exam:  BP (!) 146/69 (BP Location: Left Arm, Patient Position: Sitting) Comment: Notified Nurse of BP  Pulse 74   Temp 98 F (36.7 C) (Oral)   Resp 18   Ht 5\' 4"  (1.626 m)   Wt 219 lb (99.3 kg)   SpO2 97%   BMI 37.59 kg/m  ECOG: 1  Physical Exam  Constitutional: No distress.  HENT:  Head: Normocephalic and atraumatic.  Cardiovascular: Normal rate, regular rhythm and normal heart sounds. Exam reveals no gallop and no friction rub.  No murmur heard. Pulmonary/Chest: Effort normal and breath sounds normal. No respiratory distress. She has no wheezes. She has no rales.  Neurological: She is alert.  Skin: Skin is warm and dry. No rash noted. She is not diaphoretic. No erythema.  A 1 x 1 cm macular area of hyperpigmentation in the left chest wall consistent with scarring at the site of a bug bite.    Lab Review:     Component Value Date/Time   NA 140 01/23/2018 1429   NA 141 05/16/2017 1416   K 4.5 01/23/2018 1429   K 3.8 05/16/2017 1416   CL 104 01/23/2018 1429   CO2 28 01/23/2018 1429   CO2 27 05/16/2017 1416   GLUCOSE 130 (H) 01/23/2018 1429   GLUCOSE 140 05/16/2017 1416   BUN 14 01/23/2018 1429   BUN 13.2 05/16/2017 1416   CREATININE 0.86 01/23/2018 1429   CREATININE 0.80 09/22/2017  1555   CREATININE 0.8 05/16/2017 1416   CALCIUM 10.9 (H) 01/23/2018 1429   CALCIUM 11.0 (  H) 05/16/2017 1416   PROT 7.0 01/23/2018 1429   PROT 6.9 05/16/2017 1416   ALBUMIN 3.7 01/23/2018 1429   ALBUMIN 3.6 05/16/2017 1416   AST 14 (L) 01/23/2018 1429   AST 13 05/16/2017 1416   ALT 16 01/23/2018 1429   ALT <6 05/16/2017 1416   ALKPHOS 89 01/23/2018 1429   ALKPHOS 130 05/16/2017 1416   BILITOT 0.3 01/23/2018 1429   BILITOT 0.31 05/16/2017 1416   GFRNONAA >60 01/23/2018 1429   GFRNONAA 75 08/19/2015 1602   GFRAA >60 01/23/2018 1429   GFRAA 87 08/19/2015 1602       Component Value Date/Time   WBC 7.2 02/20/2018 1415   RBC 4.51 02/20/2018 1415   HGB 12.6 02/20/2018 1415   HGB 11.6 05/16/2017 1417   HCT 38.9 02/20/2018 1415   HCT 36.7 05/16/2017 1417   PLT 230 02/20/2018 1415   PLT 323 05/16/2017 1417   MCV 86.3 02/20/2018 1415   MCV 80.8 05/16/2017 1417   MCH 28.0 02/20/2018 1415   MCHC 32.4 02/20/2018 1415   RDW 15.8 (H) 02/20/2018 1415   RDW 16.8 (H) 05/16/2017 1417   LYMPHSABS 0.9 02/20/2018 1415   LYMPHSABS 1.1 05/16/2017 1417   MONOABS 0.8 02/20/2018 1415   MONOABS 0.8 05/16/2017 1417   EOSABS 0.2 02/20/2018 1415   EOSABS 0.2 05/16/2017 1417   BASOSABS 0.1 02/20/2018 1415   BASOSABS 0.1 05/16/2017 1417   -------------------------------  Imaging from last 24 hours (if applicable):  Radiology interpretation: Dg Hip Unilat W Or W/o Pelvis 1 View Left  Result Date: 02/21/2018 CLINICAL DATA:  Left hip pain EXAM: DG HIP (WITH OR WITHOUT PELVIS) 1V*L* COMPARISON:  January 31, 2017, January 16, 2017 FINDINGS: There is no evidence of hip fracture or dislocation. Left total left hip replacement is identified without malalignment. IMPRESSION: Left hip replacement without malalignment. No acute fracture or dislocation noted. Electronically Signed   By: Abelardo Diesel M.D.   On: 02/21/2018 10:03

## 2018-03-01 ENCOUNTER — Telehealth: Payer: Self-pay | Admitting: *Deleted

## 2018-03-01 ENCOUNTER — Other Ambulatory Visit: Payer: Self-pay | Admitting: *Deleted

## 2018-03-01 MED ORDER — PANTOPRAZOLE SODIUM 20 MG PO TBEC: 20.0000 mg | DELAYED_RELEASE_TABLET | Freq: Every day | ORAL | 3 refills | Status: DC

## 2018-03-01 NOTE — Telephone Encounter (Signed)
This RN spoke with

## 2018-03-05 ENCOUNTER — Other Ambulatory Visit: Payer: Self-pay | Admitting: *Deleted

## 2018-03-05 ENCOUNTER — Encounter (HOSPITAL_COMMUNITY): Payer: Medicare Other

## 2018-03-05 DIAGNOSIS — C50411 Malignant neoplasm of upper-outer quadrant of right female breast: Secondary | ICD-10-CM

## 2018-03-05 DIAGNOSIS — Z170 Estrogen receptor positive status [ER+]: Secondary | ICD-10-CM

## 2018-03-05 DIAGNOSIS — Z7901 Long term (current) use of anticoagulants: Secondary | ICD-10-CM

## 2018-03-05 DIAGNOSIS — D6859 Other primary thrombophilia: Secondary | ICD-10-CM

## 2018-03-05 DIAGNOSIS — I82401 Acute embolism and thrombosis of unspecified deep veins of right lower extremity: Secondary | ICD-10-CM

## 2018-03-06 ENCOUNTER — Telehealth: Payer: Self-pay | Admitting: Oncology

## 2018-03-06 ENCOUNTER — Inpatient Hospital Stay: Payer: Medicare Other | Attending: Oncology | Admitting: Oncology

## 2018-03-06 ENCOUNTER — Ambulatory Visit (HOSPITAL_COMMUNITY)
Admission: RE | Admit: 2018-03-06 | Discharge: 2018-03-06 | Disposition: A | Discharge status: Home / Self Care | Payer: Medicare Other | Source: Ambulatory Visit | Attending: Oncology | Admitting: Oncology

## 2018-03-06 VITALS — BP 153/59 | HR 64 | Temp 97.6°F | Resp 18 | Ht 64.0 in | Wt 220.6 lb

## 2018-03-06 DIAGNOSIS — Z170 Estrogen receptor positive status [ER+]: Secondary | ICD-10-CM

## 2018-03-06 DIAGNOSIS — C7951 Secondary malignant neoplasm of bone: Secondary | ICD-10-CM | POA: Diagnosis not present

## 2018-03-06 DIAGNOSIS — Z79818 Long term (current) use of other agents affecting estrogen receptors and estrogen levels: Secondary | ICD-10-CM | POA: Diagnosis not present

## 2018-03-06 DIAGNOSIS — C50411 Malignant neoplasm of upper-outer quadrant of right female breast: Secondary | ICD-10-CM | POA: Diagnosis not present

## 2018-03-06 DIAGNOSIS — E039 Hypothyroidism, unspecified: Secondary | ICD-10-CM | POA: Diagnosis not present

## 2018-03-06 DIAGNOSIS — Z7901 Long term (current) use of anticoagulants: Secondary | ICD-10-CM | POA: Diagnosis not present

## 2018-03-06 DIAGNOSIS — M5432 Sciatica, left side: Secondary | ICD-10-CM | POA: Insufficient documentation

## 2018-03-06 DIAGNOSIS — Z79899 Other long term (current) drug therapy: Secondary | ICD-10-CM | POA: Diagnosis not present

## 2018-03-06 DIAGNOSIS — E669 Obesity, unspecified: Secondary | ICD-10-CM | POA: Diagnosis not present

## 2018-03-06 DIAGNOSIS — M1990 Unspecified osteoarthritis, unspecified site: Secondary | ICD-10-CM | POA: Diagnosis not present

## 2018-03-06 DIAGNOSIS — M79605 Pain in left leg: Secondary | ICD-10-CM | POA: Diagnosis not present

## 2018-03-06 DIAGNOSIS — M8580 Other specified disorders of bone density and structure, unspecified site: Secondary | ICD-10-CM | POA: Insufficient documentation

## 2018-03-06 DIAGNOSIS — D6859 Other primary thrombophilia: Secondary | ICD-10-CM | POA: Diagnosis not present

## 2018-03-06 DIAGNOSIS — Z923 Personal history of irradiation: Secondary | ICD-10-CM | POA: Diagnosis not present

## 2018-03-06 DIAGNOSIS — Z8042 Family history of malignant neoplasm of prostate: Secondary | ICD-10-CM | POA: Diagnosis not present

## 2018-03-06 DIAGNOSIS — I10 Essential (primary) hypertension: Secondary | ICD-10-CM | POA: Insufficient documentation

## 2018-03-06 DIAGNOSIS — I82401 Acute embolism and thrombosis of unspecified deep veins of right lower extremity: Secondary | ICD-10-CM | POA: Diagnosis not present

## 2018-03-06 DIAGNOSIS — R600 Localized edema: Secondary | ICD-10-CM | POA: Diagnosis not present

## 2018-03-06 DIAGNOSIS — K5900 Constipation, unspecified: Secondary | ICD-10-CM | POA: Insufficient documentation

## 2018-03-06 DIAGNOSIS — Z86718 Personal history of other venous thrombosis and embolism: Secondary | ICD-10-CM | POA: Diagnosis not present

## 2018-03-06 DIAGNOSIS — L600 Ingrowing nail: Secondary | ICD-10-CM | POA: Insufficient documentation

## 2018-03-06 DIAGNOSIS — Z800 Family history of malignant neoplasm of digestive organs: Secondary | ICD-10-CM | POA: Diagnosis not present

## 2018-03-06 NOTE — Progress Notes (Signed)
Left lower extremity venous duplex completed. Preliminary results - Thre is no evidence of DVT or Baker's cyst. Technically difficult to perform compressions due to pain. [place] Maleiah Dula,RVS 03/06/2018 2:24 PM

## 2018-03-06 NOTE — Telephone Encounter (Signed)
Gave pt avs and calendar  °

## 2018-03-06 NOTE — Progress Notes (Signed)
[place]  [phone] [phone]     ID: Alicia Vasquez OB: 1940/06/02  MR#: [id number]  URK#:[id number]  PCP: Briscoe Deutscher, DO GYN:   SU: [name] OTHER MD: Thea Silversmith, Hart Robinsons, Philemon Kingdom  CHIEF COMPLAINT: Estrogen receptor positive breast cancer  CURRENT TREATMENT:  Fulvestrant and denosumab/Xgeva   INTERVAL HISTORY: Alicia Vasquez is here today for follow up and treatment of her metastatic estrogen receptor positive breast cancer accompanied by her friend. Alicia Vasquez receives fulvestrant every 4 weeks, with her most recent dose on 02/20/2018. She notes that the pain shot down her leg and she had a funny taste in her mouth from a pulled tooth. She is still feeling pain in her buttock.   She also receives denosumab/Xgeva every 12 weeks. She also tolerates this well.  She completed a lower left extremity doppler on 03/06/2018. Preliminary results showed no evidence of DVT or Baker's cyst.     REVIEW OF SYSTEMS: Alicia Vasquez reports that she had a sharp pain the extended from her left chest across to her right side.  She is able to get out of the house when she goes shopping with her friends. She went to her high school class reunion last weekend. She has an eye exam on 03/21/2018. She has noticed no changes in her vision. Her current dose of warfarin is alternating 7.5/10 mg. She had a UTI last week that was treated with 3 days of Cipro. She notes that she has a right big toe ingrown nail. She also has dry and red skin on the left foot. She plans on following up with her podiatrist at [facility]. She denies unusual headaches, visual changes, nausea, vomiting, or dizziness. There has been no unusual cough, phlegm production, or pleurisy. There has been no change in bowel or bladder habits. She denies unexplained fatigue or unexplained weight loss, bleeding, rash, or fever. A detailed review of systems was otherwise stable.      BREAST CANCER HISTORY: From doctor [name]'s intake node 07/24/2013:  "77 y.o. female. Who presented with SOB and had a CT chest performed that revealed a right breast mass. Mammogram/ultrsound on 2/13 showed a mass in the 11 o'clock position in the right breast measuring 1.5 cm. Also noted was a right axillary LN measuring 1.6 cm. MRI not performed. Biopsy of mass and lymph done. 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 Lymph node + for metastatic carcinoma."  [On 09/09/2013 the patient underwent right lumpectomy and sentinel lymph node sampling. This showed ([id number]) multifocal invasive ductal carcinoma, grade 1, the largest lesion measuring 1.8 cm, the second lesion 1.2 cm. One of 4 sentinel lymph nodes was positive, with extracapsular extension. Margins were positive. HER-2 was repeated and was again negative. Further surgery 09/16/2013 obtained clear margins.  Her subsequent history is as detailed below  PAST MEDICAL HISTORY: Past Medical History:  Diagnosis Date  . Allergy   . Anxiety   . Arthritis   . Blood transfusion without reported diagnosis   . Breast cancer ([place]) 07/12/2013   Invasive Mammary Carcinoma  . DVT (deep vein thrombosis) in pregnancy ([place])   . Hypertension   . Hypothyroid   . Personal history of radiation therapy   . Pneumonia   . Radiation 11/21/13-01/07/14   Right Breast/Supraclavicular    PAST SURGICAL HISTORY: Past Surgical History:  Procedure Laterality Date  .  BREAST LUMPECTOMY Left 2015  . BREAST LUMPECTOMY WITH RADIOACTIVE SEED LOCALIZATION Right 09/09/2013   Procedure: BREAST LUMPECTOMY WITH RADIOACTIVE SEED LOCALIZATION WITH AXILLARY NODE EXCISION;  Surgeon: [name], MD;  Location: [facility];  Service: General;  Laterality: Right;  . DENTAL SURGERY  04/19/2012   13 TEETH REMOVED  . DILATION AND  CURETTAGE OF UTERUS    . ORIF PERIPROSTHETIC FRACTURE Left 01/31/2017   Procedure: REVISION and OPEN REDUCTION INTERNAL FIXATION (ORIF) PERIPROSTHETIC FRACTURE LEFT HIP;  Surgeon: Paralee Cancel, MD;  Location: [facility];  Service: Orthopedics;  Laterality: Left;  120 mins  . RE-EXCISION OF BREAST LUMPECTOMY Right 09/24/2013   Procedure: RE-EXCISION OF RIGHT BREAST LUMPECTOMY;  Surgeon: [name], MD;  Location: [facility];  Service: General;  Laterality: Right;  . TOTAL HIP ARTHROPLASTY Left 01/16/2017   Procedure: TOTAL HIP ARTHROPLASTY POSTERIOR;  Surgeon: Paralee Cancel, MD;  Location: [facility];  Service: Orthopedics;  Laterality: Left;    FAMILY HISTORY Family History  Problem Relation Age of Onset  . Heart disease Brother   . Colon cancer Brother   . Prostate cancer Brother    the patient's father died at the age of 23 after an automobile accident. The patient's mother died at the age of 58. She was a Marine scientist here in [name] in the old [facility]. She was infected with polio and was confined to a wheelchair for a good part of her life. She eventually died of pneumonia. The patient had one brother, who died with prostate cancer. She had no sisters. There is no history of breast or ovarian cancer in the family.  GYNECOLOGIC HISTORY:  Menarche age 35, first live birth age 45, the patient is GX P1. She went through the change of life at age 42. 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 She is widowed. Currently she is staying with her friend [name],  75,who is a retired Radio producer. The patient's son [name] lives in [place]. He works an Engineer, technical sales. The patient has no grandchildren. She is a Tourist information centre manager but currently attends a [employer] with her friend [name]   ADVANCED DIRECTIVES: Not in place   HEALTH MAINTENANCE: Social History   Tobacco Use  . Smoking status: Never Smoker   . Smokeless tobacco: Never Used  Substance Use Topics  . Alcohol use: No    Alcohol/week: 0.0 standard drinks  . Drug use: No     Colonoscopy: Never  PAP:  Bone density: 09/20/2016 showed a T score of -2.2  Lipid panel:  Allergies  Allergen Reactions  . Anesthetics, Amide Hypertension  . Benadryl [Diphenhydramine Hcl] Other (See Comments)    Dizziness  . Carbocaine [Mepivacaine Hcl] Hypertension  . Codeine Other (See Comments)    Dizziness  . Epinephrine Hypertension  . Sulfa Antibiotics Other (See Comments)    dizziness  . Latex Other (See Comments)    Blisters in mouth  . Tramadol     Sedation.   Marland Kitchen Penicillins Rash    Has patient had a PCN reaction causing immediate rash, facial/tongue/throat swelling, SOB or lightheadedness with hypotension: Unknown Has patient had a PCN reaction causing severe rash involving mucus membranes or skin necrosis: Unknown Has patient had a PCN reaction that required hospitalization: Unknown Has patient had a PCN reaction occurring within the last 10 years: Unknown If all of the above answers are "NO", then may proceed with  Cephalosporin use.     Current Outpatient Medications  Medication Sig Dispense Refill  . Cholecalciferol (VITAMIN D3) 5000 UNITS TABS Take 5,000 Units by mouth daily.     . ciprofloxacin (CIPRO) 500 MG tablet Take 1 tablet (500 mg total) by mouth 2 (two) times daily. 6 tablet 0  . HYDROcodone-acetaminophen (NORCO/VICODIN) 5-325 MG tablet Take 1 tablet by mouth every 6 (six) hours as needed for moderate pain. 120 tablet 0  . levothyroxine (SYNTHROID) 125 MCG tablet Take 1 tablet (125 mcg total) by mouth daily before breakfast. Dispense as written: Synthroid LAST REFILL 90 tablet 3  . pantoprazole (PROTONIX) 20 MG tablet Take 1 tablet (20 mg total) by mouth daily. 30 tablet 3  . polyethylene glycol (MIRALAX / GLYCOLAX) packet Take 17 g by mouth 2 (two) times daily. (Patient taking differently: Take 17 g by mouth as needed.  ) 14 each 0  . vitamin C (ASCORBIC ACID) 250 MG tablet Take 250 mg by mouth 3 (three) times daily.    Marland Kitchen warfarin (JANTOVEN) 5 MG tablet Take 1.5-2 tablets (7.5-10 mg total) by mouth one time only at 6 PM for 1 dose. Take 7.5 mg alternating with 10 mg po daily 90 tablet 4   No current facility-administered medications for this visit.     OBJECTIVE: Older white woman examined in a wheelchair  Vitals:   03/06/18 1503  BP: (!) 153/59  Pulse: 64  Resp: 18  Temp: 97.6 F (36.4 C)  SpO2: 98%     Body mass index is 37.87 kg/m.    ECOG FS: 2 - Symptomatic, <50% confined to bed  Sclerae unicteric, EOMs intact Oropharynx clear and moist No cervical or supraclavicular adenopathy Lungs no rales or rhonchi Heart regular rate and rhythm Abd soft, nontender, positive bowel sounds MSK no focal spinal tenderness, no upper extremity lymphedema Neuro: nonfocal, well oriented, appropriate affect Breasts: The right breast is status post lumpectomy and radiation.  There is no evidence of local recurrence.  The left breast is benign.  Both axillae are benign.   LAB RESULTS:  CMP     Component Value Date/Time   NA 140 01/23/2018 1429   NA 141 05/16/2017 1416   K 4.5 01/23/2018 1429   K 3.8 05/16/2017 1416   CL 104 01/23/2018 1429   CO2 28 01/23/2018 1429   CO2 27 05/16/2017 1416   GLUCOSE 130 (H) 01/23/2018 1429   GLUCOSE 140 05/16/2017 1416   BUN 14 01/23/2018 1429   BUN 13.2 05/16/2017 1416   CREATININE 0.86 01/23/2018 1429   CREATININE 0.80 09/22/2017 1555   CREATININE 0.8 05/16/2017 1416   CALCIUM 10.9 (H) 01/23/2018 1429   CALCIUM 11.0 (H) 05/16/2017 1416   PROT 7.0 01/23/2018 1429   PROT 6.9 05/16/2017 1416   ALBUMIN 3.7 01/23/2018 1429   ALBUMIN 3.6 05/16/2017 1416   AST 14 (L) 01/23/2018 1429   AST 13 05/16/2017 1416   ALT 16 01/23/2018 1429   ALT <6 05/16/2017 1416   ALKPHOS 89 01/23/2018 1429   ALKPHOS 130 05/16/2017 1416   BILITOT 0.3 01/23/2018 1429   BILITOT 0.31  05/16/2017 1416   GFRNONAA >60 01/23/2018 1429   GFRNONAA 75 08/19/2015 1602   GFRAA >60 01/23/2018 1429   GFRAA 87 08/19/2015 1602    I No results found for: SPEP  Lab Results  Component Value Date   WBC 7.2 02/20/2018   NEUTROABS 5.2 02/20/2018   HGB 12.6 02/20/2018   HCT 38.9 02/20/2018  MCV 86.3 02/20/2018   PLT 230 02/20/2018      Chemistry      Component Value Date/Time   NA 140 01/23/2018 1429   NA 141 05/16/2017 1416   K 4.5 01/23/2018 1429   K 3.8 05/16/2017 1416   CL 104 01/23/2018 1429   CO2 28 01/23/2018 1429   CO2 27 05/16/2017 1416   BUN 14 01/23/2018 1429   BUN 13.2 05/16/2017 1416   CREATININE 0.86 01/23/2018 1429   CREATININE 0.80 09/22/2017 1555   CREATININE 0.8 05/16/2017 1416      Component Value Date/Time   CALCIUM 10.9 (H) 01/23/2018 1429   CALCIUM 11.0 (H) 05/16/2017 1416   ALKPHOS 89 01/23/2018 1429   ALKPHOS 130 05/16/2017 1416   AST 14 (L) 01/23/2018 1429   AST 13 05/16/2017 1416   ALT 16 01/23/2018 1429   ALT <6 05/16/2017 1416   BILITOT 0.3 01/23/2018 1429   BILITOT 0.31 05/16/2017 1416       No results found for: LABCA2  No components found for: LABCA125  No results for input(s): INR in the last 168 hours.  Urinalysis    Component Value Date/Time   COLORURINE STRAW (A) 02/20/2018 [place] 02/20/2018 1606   LABSPEC 1.009 02/20/2018 1606   LABSPEC 1.010 09/22/2016 1553   PHURINE 6.0 02/20/2018 1606   GLUCOSEU NEGATIVE 02/20/2018 1606   GLUCOSEU Negative 09/22/2016 1553   HGBUR NEGATIVE 02/20/2018 1606   BILIRUBINUR NEGATIVE 02/20/2018 1606   BILIRUBINUR Negative 01/06/2017 1608   BILIRUBINUR Negative 09/22/2016 1553   KETONESUR NEGATIVE 02/20/2018 1606   PROTEINUR NEGATIVE 02/20/2018 1606   UROBILINOGEN 0.2 01/06/2017 1608   UROBILINOGEN 0.2 11/15/2016 1358   UROBILINOGEN 0.2 09/22/2016 1553   NITRITE NEGATIVE 02/20/2018 1606   LEUKOCYTESUR NEGATIVE 02/20/2018 1606   LEUKOCYTESUR Negative  09/22/2016 1553    STUDIES: Dg Hip Unilat W Or W/o Pelvis 1 View Left  Result Date: 02/21/2018 CLINICAL DATA:  Left hip pain EXAM: DG HIP (WITH OR WITHOUT PELVIS) 1V*L* COMPARISON:  January 31, 2017, January 16, 2017 FINDINGS: There is no evidence of hip fracture or dislocation. Left total left hip replacement is identified without malalignment. IMPRESSION: Left hip replacement without malalignment. No acute fracture or dislocation noted. Electronically Signed   By: Abelardo Diesel M.D.   On: 02/21/2018 10:03    ASSESSMENT: 77 y.o. [place] woman status post right breast upper outer quadrant lumpectomy and sentinel lymph node sampling 09/09/2013 for an mpT1c pN1a, stage IIA invasive ductal carcinoma, estrogen and progesterone receptor both 100% positive with strong staining intensity, MIB-1 of 17% and no HER-2 amplification  (1) additional surgery for margin clearance 09/16/2013 obtained negative margins  (2) Oncotype DX recurrence score of 4 predicts a risk of outside the breast recurrence within 10 years of 7% if the patient's only systemic therapy is tamoxifen for 5 years. It also predicts no benefit from chemotherapy  (3) adjuvant radiation completed 01/07/2014  (4) anastrozole started 02/27/2014 stopped within 2 weeks because of arm swelling.   (a) bone density April 2016 showed osteopenia, with a t-score of -1.6  (b) anastrozole resumed 12/17/2015  (c) Bone density 09/20/2016 showed a T score of -2.2  (5) history of left lower extremity DVT 11/23/2012, initially on rivaroxaban, which caused chest pain, switch to Coumadin July 2014  (b) coumadin dose increased to 7.5/10 mg alternating days as of 06/13/2017   METASTATIC DISEASE: August 2018 (6) status post left total hip replacement 01/16/2017 for estrogen  receptor positive adenocarcinoma.  (a) CA 27-29 was 46.4 as of 04/18/2017  (b) chest CT scan 05/02/2017 shows no lung or liver lesion concern; it does show aortic  atherosclerosis  (c) baseline bone scan 05/02/2017 was negative  (d) PET scan 09/12/2017 shows no active disease, including bone  (7) fulvestrant started 04/18/2017  (8) denosumab/Xgeva started 05/16/2017  (a) changed to every 12-weeks after 10/03/2017 dose   PLAN:  Alicia Vasquez is now a little over a year out from definitive diagnosis of metastatic breast cancer, with very well-controlled disease.  This is favorable.  She is tolerating her treatment generally well but she did have a significant problem with pain after her most recent fulvestrant shot.  She was lobbying to change the treatment.  However I am reluctant to change something that is working so well and while the last shot was painful though all the ones before were not that difficult for her.  Accordingly we are going to continue as before.  She continues on warfarin 7.5 alternating with 10 mg daily and her INRs have been very stable.  It was favorable that the Dopplers were negative today.  She will see [name] again in November with her treatment and then I will obtain a CT of the chest late December and see her with her January treatment dose  She knows to call for any other issues that may develop before the next visit.  Alicia Vasquez, [name], MD  03/06/18 3:35 PM Medical Oncology and Hematology [facility] [address] [place], [place] [address] Tel. [phone]    Fax. [phone]  Alice Rieger, am acting as scribe for Chauncey Cruel MD.  I, Lurline Del MD, have reviewed the above documentation for accuracy and completeness, and I agree with the above.

## 2018-03-11 ENCOUNTER — Other Ambulatory Visit: Payer: Self-pay | Admitting: Oncology

## 2018-03-11 DIAGNOSIS — Z86711 Personal history of pulmonary embolism: Secondary | ICD-10-CM

## 2018-03-14 ENCOUNTER — Telehealth: Payer: Self-pay | Admitting: Family Medicine

## 2018-03-14 MED ORDER — CLINDAMYCIN HCL 300 MG PO CAPS: 300.0000 mg | ORAL_CAPSULE | Freq: Three times a day (TID) | ORAL | 0 refills | Status: DC

## 2018-03-14 NOTE — Telephone Encounter (Signed)
Copied from [place] [id number]. Topic: General - Other >> Mar 14, 2018  3:12 PM Yvette Rack wrote: Reason for CRM: Pt states she has an appt scheduled for 03/15/18 for an ingrown toe nail and she would like to ask Dr Juleen China to prescribe clindamycin (CLEOCIN) 300 MG capsule. Pt requests a call back to advise if Rx will be approved. Cb# [phone]

## 2018-03-14 NOTE — Telephone Encounter (Signed)
ABX sent to the pharmacy and patient scheduled to come in Friday.

## 2018-03-15 ENCOUNTER — Encounter: Payer: Self-pay | Admitting: Podiatry

## 2018-03-15 ENCOUNTER — Ambulatory Visit (INDEPENDENT_AMBULATORY_CARE_PROVIDER_SITE_OTHER): Payer: Medicare Other | Admitting: Podiatry

## 2018-03-15 DIAGNOSIS — M79675 Pain in left toe(s): Secondary | ICD-10-CM

## 2018-03-15 DIAGNOSIS — M79674 Pain in right toe(s): Secondary | ICD-10-CM | POA: Diagnosis not present

## 2018-03-15 DIAGNOSIS — B351 Tinea unguium: Secondary | ICD-10-CM | POA: Diagnosis not present

## 2018-03-15 NOTE — Progress Notes (Deleted)
Alicia Vasquez is a 77 y.o. female is here for follow up.  History of Present Illness:   {CMA SCRIBE ATTESTATION}  HPI:   Health Maintenance Due  Topic Date Due  . INFLUENZA VACCINE  12/28/2017   Depression screen [facility] 2/9 06/15/2017 04/26/2017 04/12/2017  Decreased Interest 0 0 0  Down, Depressed, Hopeless 0 1 1  PHQ - 2 Score 0 1 1  Some recent data might be hidden   PMHx, SurgHx, SocialHx, FamHx, Medications, and Allergies were reviewed in the Visit Navigator and updated as appropriate.   Patient Active Problem List   Diagnosis Date Noted  . Aortic atherosclerosis ([place]) 06/13/2017  . Peri-prosthetic fracture of shaft of femur 01/26/2017  . Pain from bone metastases ([place]) 01/25/2017  . Bone metastases ([place]) 01/25/2017  . Endometrial hyperplasia 01/16/2017  . Lytic bone lesion of left femur 01/15/2017  . Hip fracture, pathological ([place]) 01/15/2017  . Lymphedema 09/17/2015  . Long term current use of anticoagulant therapy 08/23/2015  . DVT, lower extremity ([place]) 06/18/2015  . Bilateral knee pain 04/03/2015  . Arm edema 08/28/2014  . Vitamin D deficiency 04/23/2014  . Hyperparathyroidism ([place]) 04/23/2014  . Depression 07/18/2013  . Malignant neoplasm of upper-outer quadrant of right breast in female, estrogen receptor positive ([place]) 07/15/2013  . Overactive bladder 01/30/2013  . Primary hypercoagulable state ([place]) 12/19/2012  . HTN (hypertension) 09/27/2011  . Hearing loss 09/27/2011  . Seasonal allergies 09/27/2011  . Hypothyroid 08/26/2011   Social History   Tobacco Use  . Smoking status: Never Smoker  . Smokeless tobacco: Never Used  Substance Use Topics  . Alcohol use: No    Alcohol/week: 0.0 standard drinks  . Drug use: No   Current Medications and Allergies:   Current Outpatient Medications:  .  Cholecalciferol (VITAMIN D3) 5000 UNITS TABS, Take 5,000 Units by mouth daily. , Disp: , Rfl:  .  ciprofloxacin (CIPRO) 500 MG tablet, Take 1 tablet (500 mg  total) by mouth 2 (two) times daily., Disp: 6 tablet, Rfl: 0 .  clindamycin (CLEOCIN) 300 MG capsule, Take 1 capsule (300 mg total) by mouth 3 (three) times daily., Disp: 30 capsule, Rfl: 0 .  HYDROcodone-acetaminophen (NORCO/VICODIN) 5-325 MG tablet, Take 1 tablet by mouth every 6 (six) hours as needed for moderate pain., Disp: 120 tablet, Rfl: 0 .  levothyroxine (SYNTHROID) 125 MCG tablet, Take 1 tablet (125 mcg total) by mouth daily before breakfast. Dispense as written: Synthroid LAST REFILL, Disp: 90 tablet, Rfl: 3 .  pantoprazole (PROTONIX) 20 MG tablet, Take 1 tablet (20 mg total) by mouth daily., Disp: 30 tablet, Rfl: 3 .  polyethylene glycol (MIRALAX / GLYCOLAX) packet, Take 17 g by mouth 2 (two) times daily. (Patient taking differently: Take 17 g by mouth as needed. ), Disp: 14 each, Rfl: 0 .  vitamin C (ASCORBIC ACID) 250 MG tablet, Take 250 mg by mouth 3 (three) times daily., Disp: , Rfl:  .  warfarin (COUMADIN) 5 MG tablet, TAKE 1.5-2 TABLETS ONE TIME ONLY AT 6 PM FOR 1 DOSE. TAKE 7.5 MG ALTERNATING WITH 10 MG DAILY, Disp: 90 tablet, Rfl: 4  Allergies  Allergen Reactions  . Anesthetics, Amide Hypertension  . Benadryl [Diphenhydramine Hcl] Other (See Comments)    Dizziness  . Carbocaine [Mepivacaine Hcl] Hypertension  . Codeine Other (See Comments)    Dizziness  . Epinephrine Hypertension  . Sulfa Antibiotics Other (See Comments)    dizziness  . Latex Other (See Comments)    Blisters  in mouth  . Tramadol     Sedation.   Marland Kitchen Penicillins Rash    Has patient had a PCN reaction causing immediate rash, facial/tongue/throat swelling, SOB or lightheadedness with hypotension: Unknown Has patient had a PCN reaction causing severe rash involving mucus membranes or skin necrosis: Unknown Has patient had a PCN reaction that required hospitalization: Unknown Has patient had a PCN reaction occurring within the last 10 years: Unknown If all of the above answers are "NO", then may proceed with  Cephalosporin use.    Review of Systems   Pertinent items are noted in the HPI. Otherwise, ROS is negative.  Vitals:  There were no vitals filed for this visit.   There is no height or weight on file to calculate BMI.  Physical Exam:   Physical Exam  Results for orders placed or performed in visit on 02/20/18  Urine Culture  Result Value Ref Range   Specimen Description      URINE, CLEAN CATCH Performed at [facility], [place] [address], [place], [place] [address]    Special Requests      NONE Performed at [facility], [place] [address], [place], [name] [address]    Culture 60,000 COLONIES/mL PROTEUS MIRABILIS (A)    Report Status 02/23/2018 FINAL    Organism ID, Bacteria PROTEUS MIRABILIS (A)       Susceptibility   Proteus mirabilis - MIC*    AMPICILLIN <=2 SENSITIVE Sensitive     CEFAZOLIN <=4 SENSITIVE Sensitive     CEFTRIAXONE <=1 SENSITIVE Sensitive     CIPROFLOXACIN <=0.25 SENSITIVE Sensitive     GENTAMICIN <=1 SENSITIVE Sensitive     IMIPENEM 2 SENSITIVE Sensitive     NITROFURANTOIN 128 RESISTANT Resistant     TRIMETH/SULFA <=20 SENSITIVE Sensitive     AMPICILLIN/SULBACTAM <=2 SENSITIVE Sensitive     PIP/TAZO <=4 SENSITIVE Sensitive     * 60,000 COLONIES/mL PROTEUS MIRABILIS  CBC with Differential  Result Value Ref Range   WBC 7.2 3.9 - 10.3 K/uL   RBC 4.51 3.70 - 5.45 MIL/uL   Hemoglobin 12.6 11.6 - 15.9 g/dL   HCT 38.9 34.8 - 46.6 %   MCV 86.3 79.5 - 101.0 fL   MCH 28.0 25.1 - 34.0 pg   MCHC 32.4 31.5 - 36.0 g/dL   RDW 15.8 (H) 11.2 - 14.5 %   Platelets 230 145 - 400 K/uL   Neutrophils Relative % 72 %   Neutro Abs 5.2 1.5 - 6.5 K/uL   Lymphocytes Relative 13 %   Lymphs Abs 0.9 0.9 - 3.3 K/uL   Monocytes Relative 11 %   Monocytes Absolute 0.8 0.1 - 0.9 K/uL   Eosinophils Relative 3 %   Eosinophils Absolute 0.2 0.0 - 0.5 K/uL   Basophils Relative 1 %   Basophils Absolute 0.1 0.0 - 0.1 K/uL    Protime-INR  Result Value Ref Range   Prothrombin Time 27.3 (H) 11.4 - 15.2 seconds   INR 2.55   CA 27.29  Result Value Ref Range   CA 27.29 31.8 0.0 - 38.6 U/mL  Urinalysis, Complete w Microscopic  Result Value Ref Range   Color, Urine STRAW (A) YELLOW   APPearance CLEAR CLEAR   Specific Gravity, Urine 1.009 1.005 - 1.030   pH 6.0 5.0 - 8.0   Glucose, UA NEGATIVE NEGATIVE mg/dL   Hgb urine dipstick NEGATIVE NEGATIVE   Bilirubin Urine NEGATIVE NEGATIVE   Ketones, ur NEGATIVE NEGATIVE mg/dL  Protein, ur NEGATIVE NEGATIVE mg/dL   Nitrite NEGATIVE NEGATIVE   Leukocytes, UA NEGATIVE NEGATIVE   RBC / HPF 0-5 0 - 5 RBC/hpf   WBC, UA 0-5 0 - 5 WBC/hpf   Bacteria, UA RARE (A) NONE SEEN   Squamous Epithelial / LPF 0-5 0 - 5   *Note: Due to a large number of results and/or encounters for the requested time period, some results have not been displayed. A complete set of results can be found in Results Review.    Assessment and Plan:   There are no diagnoses linked to this encounter.  . Reviewed expectations re: course of current medical issues. . Discussed self-management of symptoms. . Outlined signs and symptoms indicating need for more acute intervention. . Patient verbalized understanding and all questions were answered. Marland Kitchen Health Maintenance issues including appropriate healthy diet, exercise, and smoking avoidance were discussed with patient. . See orders for this visit as documented in the electronic medical record. . Patient received an After Visit Summary.  *** CMA served as Education administrator during this visit. History, Physical, and Plan performed by medical provider. The above documentation has been reviewed and is accurate and complete. Briscoe Deutscher, D.O.  Briscoe Deutscher, DO [place], [place] [facility] 03/15/2018

## 2018-03-16 ENCOUNTER — Encounter: Payer: Self-pay | Admitting: Physician Assistant

## 2018-03-16 ENCOUNTER — Ambulatory Visit (INDEPENDENT_AMBULATORY_CARE_PROVIDER_SITE_OTHER): Payer: Medicare Other | Admitting: Physician Assistant

## 2018-03-16 ENCOUNTER — Ambulatory Visit: Payer: Medicare Other | Admitting: Family Medicine

## 2018-03-16 VITALS — BP 140/62 | HR 78 | Temp 98.4°F | Ht 64.0 in | Wt 221.0 lb

## 2018-03-16 DIAGNOSIS — R300 Dysuria: Secondary | ICD-10-CM

## 2018-03-16 DIAGNOSIS — E039 Hypothyroidism, unspecified: Secondary | ICD-10-CM

## 2018-03-16 LAB — POCT URINALYSIS DIPSTICK
BILIRUBIN UA: NEGATIVE
Blood, UA: NEGATIVE
GLUCOSE UA: NEGATIVE
Ketones, UA: NEGATIVE
Leukocytes, UA: NEGATIVE
Nitrite, UA: NEGATIVE
Protein, UA: NEGATIVE
SPEC GRAV UA: 1.015 (ref 1.010–1.025)
Urobilinogen, UA: 0.2 E.U./dL
pH, UA: 7.5 (ref 5.0–8.0)

## 2018-03-16 LAB — T4, FREE: Free T4: 1.06 ng/dL (ref 0.60–1.60)

## 2018-03-16 LAB — TSH: TSH: 2.23 u[IU]/mL (ref 0.35–4.50)

## 2018-03-16 NOTE — Progress Notes (Signed)
Alicia Vasquez is a 77 y.o. female here for a recurrent problem.  I acted as a Education administrator for [employer], PA-C Anselmo Pickler, LPN  History of Present Illness:   Chief Complaint  Patient presents with  . Dysuria   She was seen by oncology for UTI in September and was treated with Cipro x 3 days.   Yesterday she started Clindamycin for an infected toenail and started this yesterday, she is on day 2/10 of this.  She also states that she was told that her thyroid levels need to be re-checked. She is here to have this re-checked. She states that it is difficult to distinguish what her symptoms are because she is on a new injection medication for her cancer and states that it has been causing symptoms like burning in her legs -- her oncologist is aware of these symptoms. Lab Results  Component Value Date   TSH 3.14 05/05/2017     Urinary Tract Infection   This is a recurrent problem. Episode onset: started 2 weeks ago. The problem occurs every urination. The problem has been waxing and waning. The quality of the pain is described as burning. The pain is at a severity of 4/10. The pain is moderate. There has been no fever. Associated symptoms include frequency. Pertinent negatives include no hematuria or nausea. Associated symptoms comments: Headaches, dysuria. She has tried antibiotics (Pt is presently on Clindamycin) for the symptoms. Her past medical history is significant for recurrent UTIs.    Past Medical History:  Diagnosis Date  . Allergy   . Anxiety   . Arthritis   . Blood transfusion without reported diagnosis   . Breast cancer ([place]) 07/12/2013   Invasive Mammary Carcinoma  . DVT (deep vein thrombosis) in pregnancy   . Hypertension   . Hypothyroid   . Personal history of radiation therapy   . Pneumonia   . Radiation 11/21/13-01/07/14   Right Breast/Supraclavicular     Social History   Socioeconomic History  . Marital status: Widowed    Spouse name: Not on file  .  Number of children: 1  . Years of education: Not on file  . Highest education level: Not on file  Occupational History  . Not on file  Social Needs  . Financial resource strain: Not on file  . Food insecurity:    Worry: Not on file    Inability: Not on file  . Transportation needs:    Medical: Not on file    Non-medical: Not on file  Tobacco Use  . Smoking status: Never Smoker  . Smokeless tobacco: Never Used  Substance and Sexual Activity  . Alcohol use: No    Alcohol/week: 0.0 standard drinks  . Drug use: No  . Sexual activity: Never  Lifestyle  . Physical activity:    Days per week: Not on file    Minutes per session: Not on file  . Stress: Not on file  Relationships  . Social connections:    Talks on phone: Not on file    Gets together: Not on file    Attends religious service: Not on file    Active member of club or organization: Not on file    Attends meetings of clubs or organizations: Not on file    Relationship status: Not on file  . Intimate partner violence:    Fear of current or ex partner: Not on file    Emotionally abused: Not on file    Physically abused: Not  on file    Forced sexual activity: Not on file  Other Topics Concern  . Not on file  Social History Narrative   Exercise: yard work.    Past Surgical History:  Procedure Laterality Date  . BREAST LUMPECTOMY Left 2015  . BREAST LUMPECTOMY WITH RADIOACTIVE SEED LOCALIZATION Right 09/09/2013   Procedure: BREAST LUMPECTOMY WITH RADIOACTIVE SEED LOCALIZATION WITH AXILLARY NODE EXCISION;  Surgeon: [name], MD;  Location: [facility];  Service: General;  Laterality: Right;  . DENTAL SURGERY  04/19/2012   13 TEETH REMOVED  . DILATION AND CURETTAGE OF UTERUS    . ORIF PERIPROSTHETIC FRACTURE Left 01/31/2017   Procedure: REVISION and OPEN REDUCTION INTERNAL FIXATION (ORIF) PERIPROSTHETIC FRACTURE LEFT HIP;  Surgeon: Paralee Cancel, MD;  Location: [facility];  Service: Orthopedics;   Laterality: Left;  120 mins  . RE-EXCISION OF BREAST LUMPECTOMY Right 09/24/2013   Procedure: RE-EXCISION OF RIGHT BREAST LUMPECTOMY;  Surgeon: [name], MD;  Location: [facility];  Service: General;  Laterality: Right;  . TOTAL HIP ARTHROPLASTY Left 01/16/2017   Procedure: TOTAL HIP ARTHROPLASTY POSTERIOR;  Surgeon: Paralee Cancel, MD;  Location: [facility];  Service: Orthopedics;  Laterality: Left;    Family History  Problem Relation Age of Onset  . Heart disease Brother   . Colon cancer Brother   . Prostate cancer Brother     Allergies  Allergen Reactions  . Anesthetics, [place] Hypertension  . Benadryl [Diphenhydramine Hcl] Other (See Comments)    Dizziness  . Carbocaine [Mepivacaine Hcl] Hypertension  . Codeine Other (See Comments)    Dizziness  . Epinephrine Hypertension  . Sulfa Antibiotics Other (See Comments)    dizziness  . Latex Other (See Comments)    Blisters in mouth  . Tramadol     Sedation.   Marland Kitchen Penicillins Rash    Has patient had a PCN reaction causing immediate rash, facial/tongue/throat swelling, SOB or lightheadedness with hypotension: Unknown Has patient had a PCN reaction causing severe rash involving mucus membranes or skin necrosis: Unknown Has patient had a PCN reaction that required hospitalization: Unknown Has patient had a PCN reaction occurring within the last 10 years: Unknown If all of the above answers are "NO", then may proceed with Cephalosporin use.     Current Medications:   Current Outpatient Medications:  .  Cholecalciferol (VITAMIN D3) 5000 UNITS TABS, Take 5,000 Units by mouth daily. , Disp: , Rfl:  .  clindamycin (CLEOCIN) 300 MG capsule, Take 1 capsule (300 mg total) by mouth 3 (three) times daily., Disp: 30 capsule, Rfl: 0 .  HYDROcodone-acetaminophen (NORCO/VICODIN) 5-325 MG tablet, Take 1 tablet by mouth every 6 (six) hours as needed for moderate pain., Disp: 120 tablet, Rfl: 0 .  levothyroxine (SYNTHROID) 125 MCG  tablet, Take 1 tablet (125 mcg total) by mouth daily before breakfast. Dispense as written: Synthroid LAST REFILL, Disp: 90 tablet, Rfl: 3 .  vitamin C (ASCORBIC ACID) 250 MG tablet, Take 250 mg by mouth 3 (three) times daily., Disp: , Rfl:  .  warfarin (COUMADIN) 5 MG tablet, TAKE 1.5-2 TABLETS ONE TIME ONLY AT 6 PM FOR 1 DOSE. TAKE 7.5 MG ALTERNATING WITH 10 MG DAILY, Disp: 90 tablet, Rfl: 4 .  pantoprazole (PROTONIX) 20 MG tablet, Take 1 tablet (20 mg total) by mouth daily. (Patient not taking: Reported on 03/16/2018), Disp: 30 tablet, Rfl: 3 .  polyethylene glycol (MIRALAX / GLYCOLAX) packet, Take 17 g by mouth 2 (two) times daily. (Patient not  taking: Reported on 03/16/2018), Disp: 14 each, Rfl: 0   Review of Systems:   Review of Systems  Gastrointestinal: Negative for nausea.  Genitourinary: Positive for frequency. Negative for hematuria.  Negative unless otherwise specified per HPI.  Vitals:   Vitals:   03/16/18 1431  BP: 140/62  Pulse: 78  Temp: 98.4 F (36.9 C)  TempSrc: Oral  SpO2: 97%  Weight: 221 lb (100.2 kg)  Height: 5\' 4"  (1.626 m)     Body mass index is 37.93 kg/m.  Physical Exam:   Physical Exam  Constitutional: She is oriented to person, place, and time. She appears well-developed and well-nourished. She is cooperative.  Non-toxic appearance. She does not have a sickly appearance. She does not appear ill. No distress.  HENT:  Head: Normocephalic and atraumatic.  Eyes: Conjunctivae and EOM are normal.  Neck: Normal range of motion. Neck supple.  Cardiovascular: Normal rate, regular rhythm, S1 normal, S2 normal, normal heart sounds and normal pulses.  Pulmonary/Chest: Effort normal and breath sounds normal.  Abdominal: Normal appearance and bowel sounds are normal. There is no tenderness. There is no CVA tenderness.  Musculoskeletal: Normal range of motion.  Neurological: She is alert and oriented to person, place, and time. GCS eye subscore is 4. GCS verbal  subscore is 5. GCS motor subscore is 6.  Skin: Skin is warm, dry and intact.  Psychiatric: She has a normal mood and affect. Her speech is normal and behavior is normal. Judgment and thought content normal.  Nursing note and vitals reviewed.  Results for orders placed or performed in visit on 03/16/18  POCT urinalysis dipstick  Result Value Ref Range   Color, UA Yellow    Clarity, UA Hazy    Glucose, UA Negative Negative   Bilirubin, UA Negative    Ketones, UA Negative    Spec Grav, UA 1.015 1.010 - 1.025   Blood, UA Negative    pH, UA 7.5 5.0 - 8.0   Protein, UA Negative Negative   Urobilinogen, UA 0.2 0.2 or 1.0 E.U./dL   Nitrite, UA Negative    Leukocytes, UA Negative Negative   Appearance     Odor     *Note: Due to a large number of results and/or encounters for the requested time period, some results have not been displayed. A complete set of results can be found in Results Review.     Assessment and Plan:    Tonjua was seen today for dysuria.  Diagnoses and all orders for this visit:  Dysuria UA without significant abnormalities. Vital signs stable. Although Clindamycin does not have great coverage for UTI, discussed that she should continue this for her toe as directed by PCP, and we will contact her when we have her urine culture results back. If symptoms worsen over the weekend, needs medical attention. -     POCT urinalysis dipstick -     Urine Culture  Hypothyroidism, unspecified type It has been approximately 10 months since thyroid labs have been checked, update today and adjust synthroid prn. -     TSH -     T4, free  . Reviewed expectations re: course of current medical issues. . Discussed self-management of symptoms. . Outlined signs and symptoms indicating need for more acute intervention. . Patient verbalized understanding and all questions were answered. . See orders for this visit as documented in the electronic medical record. . Patient received an  After-Visit Summary.  CMA or LPN served as scribe during this visit.  History, Physical, and Plan performed by medical provider. The above documentation has been reviewed and is accurate and complete.  [name], PA-C

## 2018-03-16 NOTE — Patient Instructions (Signed)
It was great to see you!  We will contact you with your culture results. Continue on Clindamycin. If symptoms worsen over the weekend, please seek medical attention.  We will reach out to you with your thyroid labs as well.  Take care,  [name] PA-C

## 2018-03-16 NOTE — Progress Notes (Signed)
Subjective:   Patient ID: Alicia Vasquez, female   DOB: 77 y.o.   MRN: [id number]   HPI Patient presents stating she is had a lot of thickness of her nailbeds and has had previous ingrown's and there is sore in the corners and she cannot cut them to make them better.   ROS      Objective:  Physical Exam  Neurovascular status unchanged with thick yellow brittle nailbeds 1-5 both feet to get tender in the corner specifically the right hallux which is more tender than the other nails     Assessment:  Thick yellow brittle nailbeds with pain 1-5 both feet     Plan:  Debrided nailbeds 1-5 both feet with no iatrogenic bleeding and return for routine care or earlier if needed

## 2018-03-19 ENCOUNTER — Other Ambulatory Visit: Payer: Medicare Other

## 2018-03-19 DIAGNOSIS — H524 Presbyopia: Secondary | ICD-10-CM | POA: Diagnosis not present

## 2018-03-19 DIAGNOSIS — R300 Dysuria: Secondary | ICD-10-CM | POA: Diagnosis not present

## 2018-03-19 DIAGNOSIS — H2513 Age-related nuclear cataract, bilateral: Secondary | ICD-10-CM | POA: Diagnosis not present

## 2018-03-19 NOTE — Addendum Note (Signed)
Addended by: Frutoso Chase A on: 03/19/2018 10:36 AM   Modules accepted: Orders

## 2018-03-20 LAB — URINE CULTURE
MICRO NUMBER:: 91262362
Result:: NO GROWTH
SPECIMEN QUALITY:: ADEQUATE
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
## 2018-03-21 ENCOUNTER — Inpatient Hospital Stay: Payer: Medicare Other

## 2018-03-21 ENCOUNTER — Inpatient Hospital Stay (HOSPITAL_BASED_OUTPATIENT_CLINIC_OR_DEPARTMENT_OTHER): Payer: Medicare Other | Admitting: Oncology

## 2018-03-21 ENCOUNTER — Telehealth: Payer: Self-pay | Admitting: Oncology

## 2018-03-21 ENCOUNTER — Other Ambulatory Visit: Payer: Self-pay | Admitting: *Deleted

## 2018-03-21 VITALS — BP 139/72 | HR 78 | Temp 97.9°F | Resp 78 | Ht 64.0 in | Wt 223.6 lb

## 2018-03-21 DIAGNOSIS — K5900 Constipation, unspecified: Secondary | ICD-10-CM

## 2018-03-21 DIAGNOSIS — M84459A Pathological fracture, hip, unspecified, initial encounter for fracture: Secondary | ICD-10-CM

## 2018-03-21 DIAGNOSIS — M8580 Other specified disorders of bone density and structure, unspecified site: Secondary | ICD-10-CM | POA: Diagnosis not present

## 2018-03-21 DIAGNOSIS — Z800 Family history of malignant neoplasm of digestive organs: Secondary | ICD-10-CM

## 2018-03-21 DIAGNOSIS — Z8042 Family history of malignant neoplasm of prostate: Secondary | ICD-10-CM

## 2018-03-21 DIAGNOSIS — G893 Neoplasm related pain (acute) (chronic): Secondary | ICD-10-CM

## 2018-03-21 DIAGNOSIS — I700 Atherosclerosis of aorta: Secondary | ICD-10-CM

## 2018-03-21 DIAGNOSIS — M899 Disorder of bone, unspecified: Secondary | ICD-10-CM

## 2018-03-21 DIAGNOSIS — D039 Melanoma in situ, unspecified: Secondary | ICD-10-CM

## 2018-03-21 DIAGNOSIS — I82401 Acute embolism and thrombosis of unspecified deep veins of right lower extremity: Secondary | ICD-10-CM

## 2018-03-21 DIAGNOSIS — Z170 Estrogen receptor positive status [ER+]: Principal | ICD-10-CM

## 2018-03-21 DIAGNOSIS — D6859 Other primary thrombophilia: Secondary | ICD-10-CM | POA: Diagnosis not present

## 2018-03-21 DIAGNOSIS — C7951 Secondary malignant neoplasm of bone: Secondary | ICD-10-CM

## 2018-03-21 DIAGNOSIS — C50411 Malignant neoplasm of upper-outer quadrant of right female breast: Secondary | ICD-10-CM

## 2018-03-21 DIAGNOSIS — I10 Essential (primary) hypertension: Secondary | ICD-10-CM

## 2018-03-21 DIAGNOSIS — Z7901 Long term (current) use of anticoagulants: Secondary | ICD-10-CM

## 2018-03-21 DIAGNOSIS — M5432 Sciatica, left side: Secondary | ICD-10-CM

## 2018-03-21 DIAGNOSIS — E669 Obesity, unspecified: Secondary | ICD-10-CM

## 2018-03-21 DIAGNOSIS — M84659P Pathological fracture in other disease, hip, unspecified, subsequent encounter for fracture with malunion: Secondary | ICD-10-CM

## 2018-03-21 DIAGNOSIS — Z79818 Long term (current) use of other agents affecting estrogen receptors and estrogen levels: Secondary | ICD-10-CM

## 2018-03-21 DIAGNOSIS — Z923 Personal history of irradiation: Secondary | ICD-10-CM

## 2018-03-21 DIAGNOSIS — Z86718 Personal history of other venous thrombosis and embolism: Secondary | ICD-10-CM

## 2018-03-21 DIAGNOSIS — R600 Localized edema: Secondary | ICD-10-CM

## 2018-03-21 DIAGNOSIS — Z79899 Other long term (current) drug therapy: Secondary | ICD-10-CM

## 2018-03-21 DIAGNOSIS — M1990 Unspecified osteoarthritis, unspecified site: Secondary | ICD-10-CM

## 2018-03-21 LAB — CMP (CANCER CENTER ONLY)
ALBUMIN: 3.8 g/dL (ref 3.5–5.0)
ALT: 14 U/L (ref 0–44)
ANION GAP: 11 (ref 5–15)
AST: 14 U/L — AB (ref 15–41)
Alkaline Phosphatase: 83 U/L (ref 38–126)
BUN: 15 mg/dL (ref 8–23)
CALCIUM: 10.2 mg/dL (ref 8.9–10.3)
CO2: 25 mmol/L (ref 22–32)
CREATININE: 0.77 mg/dL (ref 0.44–1.00)
Chloride: 105 mmol/L (ref 98–111)
GFR, Estimated: >60 mL/min (ref >60)
Glucose, Bld: 105 mg/dL — ABNORMAL HIGH (ref 70–99)
Potassium: 4.2 mmol/L (ref 3.5–5.1)
Sodium: 141 mmol/L (ref 135–145)
TOTAL PROTEIN: 6.7 g/dL (ref 6.5–8.1)
Total Bilirubin: 0.3 mg/dL (ref 0.3–1.2)

## 2018-03-21 LAB — CBC WITH DIFFERENTIAL/PLATELET
Abs Immature Granulocytes: 0.02 10*3/uL (ref 0.00–0.07)
BASOS ABS: 0.1 10*3/uL (ref 0.0–0.1)
BASOS PCT: 1 %
EOS ABS: 0.3 10*3/uL (ref 0.0–0.5)
Eosinophils Relative: 4 %
HCT: 41.3 % (ref 36.0–46.0)
Hemoglobin: 12.8 g/dL (ref 12.0–15.0)
IMMATURE GRANULOCYTES: 0 %
LYMPHS ABS: 1.2 10*3/uL (ref 0.7–4.0)
Lymphocytes Relative: 18 %
MCH: 28.4 pg (ref 26.0–34.0)
MCHC: 31 g/dL (ref 30.0–36.0)
MCV: 91.6 fL (ref 80.0–100.0)
MONO ABS: 0.9 10*3/uL (ref 0.1–1.0)
MONOS PCT: 13 %
Neutro Abs: 4.2 10*3/uL (ref 1.7–7.7)
Neutrophils Relative %: 64 %
PLATELETS: 246 10*3/uL (ref 150–400)
RBC: 4.51 MIL/uL (ref 3.87–5.11)
RDW: 15.8 % — AB (ref 11.5–15.5)
WBC: 6.6 10*3/uL (ref 4.0–10.5)
nRBC: 0 % (ref 0.0–0.2)

## 2018-03-21 LAB — PROTIME-INR
INR: 1.99
PROTHROMBIN TIME: 22.3 s — AB (ref 11.4–15.2)

## 2018-03-21 MED ORDER — FULVESTRANT 250 MG/5ML IM SOLN
500.0000 mg | Freq: Once | INTRAMUSCULAR | Status: AC
  Administered 2018-03-21: 500 mg via INTRAMUSCULAR

## 2018-03-21 MED ORDER — DENOSUMAB 120 MG/1.7ML ~~LOC~~ SOLN
SUBCUTANEOUS | Status: AC
  Filled 2018-03-21: qty 1.7

## 2018-03-21 MED ORDER — DENOSUMAB 120 MG/1.7ML ~~LOC~~ SOLN
120.0000 mg | Freq: Once | SUBCUTANEOUS | Status: AC
  Administered 2018-03-21: 120 mg via SUBCUTANEOUS

## 2018-03-21 MED ORDER — FULVESTRANT 250 MG/5ML IM SOLN
INTRAMUSCULAR | Status: AC
  Filled 2018-03-21: qty 10

## 2018-03-21 NOTE — Telephone Encounter (Signed)
Gave avs and calendar ° °

## 2018-03-21 NOTE — Patient Instructions (Signed)
Fulvestrant injection What is this medicine? FULVESTRANT (ful VES trant) blocks the effects of estrogen. It is used to treat breast cancer. This medicine may be used for other purposes; ask your health care provider or pharmacist if you have questions. COMMON BRAND NAME(S): FASLODEX What should I tell my health care provider before I take this medicine? They need to know if you have any of these conditions: -bleeding problems -liver disease -low levels of platelets in the blood -an unusual or allergic reaction to fulvestrant, other medicines, foods, dyes, or preservatives -pregnant or trying to get pregnant -breast-feeding How should I use this medicine? This medicine is for injection into a muscle. It is usually given by a health care professional in a hospital or clinic setting. Talk to your pediatrician regarding the use of this medicine in children. Special care may be needed. Overdosage: If you think you have taken too much of this medicine contact a poison control center or emergency room at once. NOTE: This medicine is only for you. Do not share this medicine with others. What if I miss a dose? It is important not to miss your dose. Call your doctor or health care professional if you are unable to keep an appointment. What may interact with this medicine? -medicines that treat or prevent blood clots like warfarin, enoxaparin, and dalteparin This list may not describe all possible interactions. Give your health care provider a list of all the medicines, herbs, non-prescription drugs, or dietary supplements you use. Also tell them if you smoke, drink alcohol, or use illegal drugs. Some items may interact with your medicine. What should I watch for while using this medicine? Your condition will be monitored carefully while you are receiving this medicine. You will need important blood work done while you are taking this medicine. Do not become pregnant while taking this medicine or for  at least 1 year after stopping it. Women of child-bearing potential will need to have a negative pregnancy test before starting this medicine. Women should inform their doctor if they wish to become pregnant or think they might be pregnant. There is a potential for serious side effects to an unborn child. Men should inform their doctors if they wish to father a child. This medicine may lower sperm counts. Talk to your health care professional or pharmacist for more information. Do not breast-feed an infant while taking this medicine or for 1 year after the last dose. What side effects may I notice from receiving this medicine? Side effects that you should report to your doctor or health care professional as soon as possible: -allergic reactions like skin rash, itching or hives, swelling of the face, lips, or tongue -feeling faint or lightheaded, falls -pain, tingling, numbness, or weakness in the legs -signs and symptoms of infection like fever or chills; cough; flu-like symptoms; sore throat -vaginal bleeding Side effects that usually do not require medical attention (report to your doctor or health care professional if they continue or are bothersome): -aches, pains -constipation -diarrhea -headache -hot flashes -nausea, vomiting -pain at site where injected -stomach pain This list may not describe all possible side effects. Call your doctor for medical advice about side effects. You may report side effects to FDA at 1-800-FDA-1088. Where should I keep my medicine? This drug is given in a hospital or clinic and will not be stored at home. NOTE: This sheet is a summary. It may not cover all possible information. If you have questions about this medicine, talk to your   doctor, pharmacist, or health care provider.  2018 Elsevier/Gold Standard (2014-12-12 11:03:55)  Denosumab (xgeva) injection What is this medicine? DENOSUMAB (den oh sue mab) slows bone breakdown. Prolia is used to treat  osteoporosis in women after menopause and in men. Delton See is used to treat a high calcium level due to cancer and to prevent bone fractures and other bone problems caused by multiple myeloma or cancer bone metastases. Delton See is also used to treat giant cell tumor of the bone. This medicine may be used for other purposes; ask your health care provider or pharmacist if you have questions. COMMON BRAND NAME(S): Prolia, XGEVA What should I tell my health care provider before I take this medicine? They need to know if you have any of these conditions: -dental disease -having surgery or tooth extraction -infection -kidney disease -low levels of calcium or Vitamin D in the blood -malnutrition -on hemodialysis -skin conditions or sensitivity -thyroid or parathyroid disease -an unusual reaction to denosumab, other medicines, foods, dyes, or preservatives -pregnant or trying to get pregnant -breast-feeding How should I use this medicine? This medicine is for injection under the skin. It is given by a health care professional in a hospital or clinic setting. If you are getting Prolia, a special MedGuide will be given to you by the pharmacist with each prescription and refill. Be sure to read this information carefully each time. For Prolia, talk to your pediatrician regarding the use of this medicine in children. Special care may be needed. For Delton See, talk to your pediatrician regarding the use of this medicine in children. While this drug may be prescribed for children as young as 13 years for selected conditions, precautions do apply. Overdosage: If you think you have taken too much of this medicine contact a poison control center or emergency room at once. NOTE: This medicine is only for you. Do not share this medicine with others. What if I miss a dose? It is important not to miss your dose. Call your doctor or health care professional if you are unable to keep an appointment. What may interact with  this medicine? Do not take this medicine with any of the following medications: -other medicines containing denosumab This medicine may also interact with the following medications: -medicines that lower your chance of fighting infection -steroid medicines like prednisone or cortisone This list may not describe all possible interactions. Give your health care provider a list of all the medicines, herbs, non-prescription drugs, or dietary supplements you use. Also tell them if you smoke, drink alcohol, or use illegal drugs. Some items may interact with your medicine. What should I watch for while using this medicine? Visit your doctor or health care professional for regular checks on your progress. Your doctor or health care professional may order blood tests and other tests to see how you are doing. Call your doctor or health care professional for advice if you get a fever, chills or sore throat, or other symptoms of a cold or flu. Do not treat yourself. This drug may decrease your body's ability to fight infection. Try to avoid being around people who are sick. You should make sure you get enough calcium and vitamin D while you are taking this medicine, unless your doctor tells you not to. Discuss the foods you eat and the vitamins you take with your health care professional. See your dentist regularly. Brush and floss your teeth as directed. Before you have any dental work done, tell your dentist you are receiving this  medicine. Do not become pregnant while taking this medicine or for 5 months after stopping it. Talk with your doctor or health care professional about your birth control options while taking this medicine. Women should inform their doctor if they wish to become pregnant or think they might be pregnant. There is a potential for serious side effects to an unborn child. Talk to your health care professional or pharmacist for more information. What side effects may I notice from receiving  this medicine? Side effects that you should report to your doctor or health care professional as soon as possible: -allergic reactions like skin rash, itching or hives, swelling of the face, lips, or tongue -bone pain -breathing problems -dizziness -jaw pain, especially after dental work -redness, blistering, peeling of the skin -signs and symptoms of infection like fever or chills; cough; sore throat; pain or trouble passing urine -signs of low calcium like fast heartbeat, muscle cramps or muscle pain; pain, tingling, numbness in the hands or feet; seizures -unusual bleeding or bruising -unusually weak or tired Side effects that usually do not require medical attention (report to your doctor or health care professional if they continue or are bothersome): -constipation -diarrhea -headache -joint pain -loss of appetite -muscle pain -runny nose -tiredness -upset stomach This list may not describe all possible side effects. Call your doctor for medical advice about side effects. You may report side effects to FDA at 1-800-FDA-1088. Where should I keep my medicine? This medicine is only given in a clinic, doctor's office, or other health care setting and will not be stored at home. NOTE: This sheet is a summary. It may not cover all possible information. If you have questions about this medicine, talk to your doctor, pharmacist, or health care provider.  2018 Elsevier/Gold Standard (2016-06-07 19:17:21)

## 2018-03-22 LAB — CANCER ANTIGEN 27.29: CAN 27.29: 35.4 U/mL (ref 0.0–38.6)

## 2018-04-17 ENCOUNTER — Inpatient Hospital Stay (HOSPITAL_BASED_OUTPATIENT_CLINIC_OR_DEPARTMENT_OTHER): Payer: Medicare Other | Admitting: Adult Health

## 2018-04-17 ENCOUNTER — Inpatient Hospital Stay: Payer: Medicare Other

## 2018-04-17 ENCOUNTER — Ambulatory Visit (HOSPITAL_COMMUNITY)
Admission: RE | Admit: 2018-04-17 | Discharge: 2018-04-17 | Disposition: A | Discharge status: Home / Self Care | Payer: Medicare Other | Source: Ambulatory Visit | Attending: Adult Health | Admitting: Adult Health

## 2018-04-17 ENCOUNTER — Encounter: Payer: Self-pay | Admitting: Adult Health

## 2018-04-17 ENCOUNTER — Inpatient Hospital Stay: Payer: Medicare Other | Attending: Oncology

## 2018-04-17 VITALS — BP 134/57 | HR 73 | Temp 97.7°F | Resp 18 | Ht 64.0 in | Wt 201.4 lb

## 2018-04-17 DIAGNOSIS — Z7901 Long term (current) use of anticoagulants: Secondary | ICD-10-CM

## 2018-04-17 DIAGNOSIS — D6859 Other primary thrombophilia: Secondary | ICD-10-CM

## 2018-04-17 DIAGNOSIS — C50411 Malignant neoplasm of upper-outer quadrant of right female breast: Secondary | ICD-10-CM | POA: Diagnosis not present

## 2018-04-17 DIAGNOSIS — Z79899 Other long term (current) drug therapy: Secondary | ICD-10-CM

## 2018-04-17 DIAGNOSIS — Z79818 Long term (current) use of other agents affecting estrogen receptors and estrogen levels: Secondary | ICD-10-CM

## 2018-04-17 DIAGNOSIS — M1712 Unilateral primary osteoarthritis, left knee: Secondary | ICD-10-CM | POA: Insufficient documentation

## 2018-04-17 DIAGNOSIS — Z923 Personal history of irradiation: Secondary | ICD-10-CM | POA: Insufficient documentation

## 2018-04-17 DIAGNOSIS — C7951 Secondary malignant neoplasm of bone: Secondary | ICD-10-CM

## 2018-04-17 DIAGNOSIS — M899 Disorder of bone, unspecified: Secondary | ICD-10-CM

## 2018-04-17 DIAGNOSIS — I82401 Acute embolism and thrombosis of unspecified deep veins of right lower extremity: Secondary | ICD-10-CM

## 2018-04-17 DIAGNOSIS — Z96642 Presence of left artificial hip joint: Secondary | ICD-10-CM | POA: Diagnosis not present

## 2018-04-17 DIAGNOSIS — Z170 Estrogen receptor positive status [ER+]: Secondary | ICD-10-CM

## 2018-04-17 DIAGNOSIS — Z86718 Personal history of other venous thrombosis and embolism: Secondary | ICD-10-CM | POA: Diagnosis not present

## 2018-04-17 DIAGNOSIS — G893 Neoplasm related pain (acute) (chronic): Secondary | ICD-10-CM

## 2018-04-17 DIAGNOSIS — M84559P Pathological fracture in neoplastic disease, hip, unspecified, subsequent encounter for fracture with malunion: Secondary | ICD-10-CM

## 2018-04-17 DIAGNOSIS — Z800 Family history of malignant neoplasm of digestive organs: Secondary | ICD-10-CM | POA: Diagnosis not present

## 2018-04-17 DIAGNOSIS — M25552 Pain in left hip: Secondary | ICD-10-CM | POA: Insufficient documentation

## 2018-04-17 DIAGNOSIS — R600 Localized edema: Secondary | ICD-10-CM

## 2018-04-17 DIAGNOSIS — Z8042 Family history of malignant neoplasm of prostate: Secondary | ICD-10-CM

## 2018-04-17 DIAGNOSIS — I700 Atherosclerosis of aorta: Secondary | ICD-10-CM

## 2018-04-17 DIAGNOSIS — M8580 Other specified disorders of bone density and structure, unspecified site: Secondary | ICD-10-CM

## 2018-04-17 DIAGNOSIS — E039 Hypothyroidism, unspecified: Secondary | ICD-10-CM | POA: Diagnosis not present

## 2018-04-17 DIAGNOSIS — I10 Essential (primary) hypertension: Secondary | ICD-10-CM

## 2018-04-17 DIAGNOSIS — M84459A Pathological fracture, hip, unspecified, initial encounter for fracture: Secondary | ICD-10-CM

## 2018-04-17 DIAGNOSIS — Z471 Aftercare following joint replacement surgery: Secondary | ICD-10-CM | POA: Diagnosis not present

## 2018-04-17 DIAGNOSIS — M84659P Pathological fracture in other disease, hip, unspecified, subsequent encounter for fracture with malunion: Secondary | ICD-10-CM

## 2018-04-17 LAB — CBC WITH DIFFERENTIAL/PLATELET
ABS IMMATURE GRANULOCYTES: 0.03 10*3/uL (ref 0.00–0.07)
BASOS PCT: 1 %
Basophils Absolute: 0.1 10*3/uL (ref 0.0–0.1)
EOS PCT: 2 %
Eosinophils Absolute: 0.1 10*3/uL (ref 0.0–0.5)
HCT: 41.8 % (ref 36.0–46.0)
HEMOGLOBIN: 13.3 g/dL (ref 12.0–15.0)
Immature Granulocytes: 0 %
Lymphocytes Relative: 15 %
Lymphs Abs: 1.1 10*3/uL (ref 0.7–4.0)
MCH: 28.7 pg (ref 26.0–34.0)
MCHC: 31.8 g/dL (ref 30.0–36.0)
MCV: 90.3 fL (ref 80.0–100.0)
MONOS PCT: 12 %
Monocytes Absolute: 0.9 10*3/uL (ref 0.1–1.0)
NEUTROS ABS: 5.1 10*3/uL (ref 1.7–7.7)
Neutrophils Relative %: 70 %
PLATELETS: 248 10*3/uL (ref 150–400)
RBC: 4.63 MIL/uL (ref 3.87–5.11)
RDW: 15.3 % (ref 11.5–15.5)
WBC: 7.3 10*3/uL (ref 4.0–10.5)
nRBC: 0 % (ref 0.0–0.2)

## 2018-04-17 LAB — PROTIME-INR
INR: 2.46
Prothrombin Time: 26.3 seconds — ABNORMAL HIGH (ref 11.4–15.2)

## 2018-04-17 MED ORDER — HYDROCODONE-ACETAMINOPHEN 5-325 MG PO TABS: 1.0000 | ORAL_TABLET | Freq: Four times a day (QID) | ORAL | 0 refills | Status: DC | PRN

## 2018-04-17 MED ORDER — FULVESTRANT 250 MG/5ML IM SOLN
500.0000 mg | Freq: Once | INTRAMUSCULAR | Status: AC
  Administered 2018-04-17: 500 mg via INTRAMUSCULAR

## 2018-04-17 MED ORDER — FULVESTRANT 250 MG/5ML IM SOLN
INTRAMUSCULAR | Status: AC
  Filled 2018-04-17: qty 10

## 2018-04-17 NOTE — Progress Notes (Signed)
[place]  Telephone:([phone]) [phone] Fax:(336) [phone]     ID: Alicia Vasquez OB: 02-08-1941  MR#: [id number]  FMB#:[id number]  PCP: Briscoe Deutscher, DO GYN:   SU: [name] OTHER MD: Thea Silversmith, Hart Robinsons, Philemon Kingdom  CHIEF COMPLAINT: Estrogen receptor positive breast cancer  CURRENT TREATMENT:  Fulvestrant and denosumab/Xgeva   INTERVAL HISTORY: Alicia Vasquez is here today for follow up and treatment of her metastatic estrogen receptor positive breast cancer accompanied by her friend and caregiver.   Alicia Vasquez receives fulvestrant every 4 weeks, with her most recent dose on 10/23.  She also received xgeva on this day.  She is doing moderately well today.  She continues to have issues in her left hip with pain and swelling, and from where she underwent the doppler.    She is also taking Coumadin as directed.  She is tolerating it well.  She isn't having easy bleeding.  She does have some occasional bruising.     REVIEW OF SYSTEMS: Alicia Vasquez continues to have multiple pain symptoms including not only the left leg, but the right arm, her feet, and just "aching all over".  She notes some mild swelling in her right arm.  She has had some pain in her left hip and an xray was done on 02/21/2018 and showed no acute fracture or dislocation.  She has noted the pain and swelling has increased since then.  She denies any redness or warmth to the area.  She also notes she has some increased thickness near her right breast, upper outer quadrant.  This has been present for several months and has remained stable.  She has repeat scans in 05/29/2018.    Alicia Vasquez has Norco 1 tab every 6 hours as needed for pain prescribed.  She last received this on 02/20/2018 with 120 tablets.  She is actually taking this every 4 hours during the day while awake. She takes the medication for her hip and leg pain.  This pain is constant.  It becomes severe when the medication wears off.  She is able to be  more functional and active throughout the day with her current pain regimen.  Alicia Vasquez notes that she isn't increasingly sleepy with the medication, she isn't constipated.  She eats well and has a caregiver who lives with her.    BREAST CANCER HISTORY: From doctor [name]'s intake node 07/24/2013:  "77 y.o. female. Who presented with SOB and had a CT chest performed that revealed a right breast mass. Mammogram/ultrsound on 2/13 showed a mass in the 11 o'clock position in the right breast measuring 1.5 cm. Also noted was a right axillary LN measuring 1.6 cm. MRI not performed. Biopsy of mass and lymph done. 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 Lymph node + for metastatic carcinoma."  [On 09/09/2013 the patient underwent right lumpectomy and sentinel lymph node sampling. This showed ([id number]) multifocal invasive ductal carcinoma, grade 1, the largest lesion measuring 1.8 cm, the second lesion 1.2 cm. One of 4 sentinel lymph nodes was positive, with extracapsular extension. Margins were positive. HER-2 was repeated and was again negative. Further surgery 09/16/2013 obtained clear margins.  Her subsequent history is as detailed below  PAST MEDICAL HISTORY: Past Medical History:  Diagnosis Date  . Allergy   . Anxiety   . Arthritis   . Blood transfusion without reported diagnosis   . Breast cancer ([place]) 07/12/2013  Invasive Mammary Carcinoma  . DVT (deep vein thrombosis) in pregnancy   . Hypertension   . Hypothyroid   . Personal history of radiation therapy   . Pneumonia   . Radiation 11/21/13-01/07/14   Right Breast/Supraclavicular    PAST SURGICAL HISTORY: Past Surgical History:  Procedure Laterality Date  . BREAST LUMPECTOMY Left 2015  . BREAST LUMPECTOMY WITH RADIOACTIVE SEED LOCALIZATION Right 09/09/2013   Procedure: BREAST LUMPECTOMY WITH RADIOACTIVE SEED  LOCALIZATION WITH AXILLARY NODE EXCISION;  Surgeon: [name], MD;  Location: [facility];  Service: General;  Laterality: Right;  . DENTAL SURGERY  04/19/2012   13 TEETH REMOVED  . DILATION AND CURETTAGE OF UTERUS    . ORIF PERIPROSTHETIC FRACTURE Left 01/31/2017   Procedure: REVISION and OPEN REDUCTION INTERNAL FIXATION (ORIF) PERIPROSTHETIC FRACTURE LEFT HIP;  Surgeon: Paralee Cancel, MD;  Location: [facility];  Service: Orthopedics;  Laterality: Left;  120 mins  . RE-EXCISION OF BREAST LUMPECTOMY Right 09/24/2013   Procedure: RE-EXCISION OF RIGHT BREAST LUMPECTOMY;  Surgeon: [name], MD;  Location: [facility];  Service: General;  Laterality: Right;  . TOTAL HIP ARTHROPLASTY Left 01/16/2017   Procedure: TOTAL HIP ARTHROPLASTY POSTERIOR;  Surgeon: Paralee Cancel, MD;  Location: [facility];  Service: Orthopedics;  Laterality: Left;    FAMILY HISTORY Family History  Problem Relation Age of Onset  . Heart disease Brother   . Colon cancer Brother   . Prostate cancer Brother    the patient's father died at the age of 23 after an automobile accident. The patient's mother died at the age of 45. She was a Marine scientist here in [name] in the old [facility]. She was infected with polio and was confined to a wheelchair for a good part of her life. She eventually died of pneumonia. The patient had one brother, who died with prostate cancer. She had no sisters. There is no history of breast or ovarian cancer in the family.  GYNECOLOGIC HISTORY:  Menarche age 17, first live birth age 48, the patient is GX P1. She went through the change of life at age 60. 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 She is widowed. Currently she is staying with her friend Alicia Vasquez,  22,who is a retired Radio producer. The patient's son Alicia Vasquez lives in [place]. He works an Engineer, technical sales. The patient has no  grandchildren. She is a Tourist information centre manager but currently attends a [employer] with her friend Alicia Vasquez   ADVANCED DIRECTIVES: Not in place   HEALTH MAINTENANCE: Social History   Tobacco Use  . Smoking status: Never Smoker  . Smokeless tobacco: Never Used  Substance Use Topics  . Alcohol use: No    Alcohol/week: 0.0 standard drinks  . Drug use: No     Colonoscopy: Never  PAP:  Bone density: 09/20/2016 showed a T score of -2.2  Lipid panel:  Allergies  Allergen Reactions  . Anesthetics, [place] Hypertension  . Benadryl [Diphenhydramine Hcl] Other (See Comments)    Dizziness  . Carbocaine [Mepivacaine Hcl] Hypertension  . Codeine Other (See Comments)    Dizziness  . Epinephrine Hypertension  . Sulfa Antibiotics Other (See Comments)    dizziness  . Latex Other (See Comments)    Blisters in mouth  . Tramadol     Sedation.   Marland Kitchen Penicillins Rash    Has patient had a PCN reaction causing immediate rash, facial/tongue/throat swelling, SOB or  lightheadedness with hypotension: Unknown Has patient had a PCN reaction causing severe rash involving mucus membranes or skin necrosis: Unknown Has patient had a PCN reaction that required hospitalization: Unknown Has patient had a PCN reaction occurring within the last 10 years: Unknown If all of the above answers are "NO", then may proceed with Cephalosporin use.     Current Outpatient Medications  Medication Sig Dispense Refill  . Cholecalciferol (VITAMIN D3) 5000 UNITS TABS Take 5,000 Units by mouth daily.     Marland Kitchen HYDROcodone-acetaminophen (NORCO/VICODIN) 5-325 MG tablet Take 1 tablet by mouth every 6 (six) hours as needed for moderate pain. 120 tablet 0  . levothyroxine (SYNTHROID) 125 MCG tablet Take 1 tablet (125 mcg total) by mouth daily before breakfast. Dispense as written: Synthroid LAST REFILL 90 tablet 3  . vitamin C (ASCORBIC ACID) 250 MG tablet Take 250 mg by mouth 3 (three) times daily.    Marland Kitchen warfarin (COUMADIN) 5 MG tablet TAKE  1.5-2 TABLETS ONE TIME ONLY AT 6 PM FOR 1 DOSE. TAKE 7.5 MG ALTERNATING WITH 10 MG DAILY 90 tablet 4   No current facility-administered medications for this visit.     OBJECTIVE: Older white woman examined in a wheelchair  Vitals:   04/17/18 1333  BP: (!) 134/57  Pulse: 73  Resp: 18  Temp: 97.7 F (36.5 C)  SpO2: 96%     Body mass index is 34.57 kg/m.    ECOG FS: 2 - Symptomatic, <50% confined to bed GENERAL: Patient is a chronically ill appearing woman in wheelchair, accompanied by her friend and caregiver HEENT:  Sclerae anicteric.  Oropharynx clear and moist. No ulcerations or evidence of oropharyngeal candidiasis. Neck is supple.  NODES:  No cervical, supraclavicular, or axillary lymphadenopathy palpated.  BREAST EXAM:  Patient right breast examined, no sign of local recurrence noted.  LUNGS:  Clear to auscultation bilaterally.  No wheezes or rhonchi. HEART:  Regular rate and rhythm. No murmur appreciated. ABDOMEN:  Soft, nontender.  Positive, normoactive bowel sounds. No organomegaly palpated. MSK:  No focal spinal tenderness to palpation. Left hip examined, surgical scar well healed, no warmth noted, no erythema noted.  Mild swelling noted posteriorly, +TTP EXTREMITIES:  No peripheral edema.   SKIN:  Clear with no obvious rashes or skin changes. No nail dyscrasia. NEURO:  Nonfocal. Well oriented.  Appropriate affect.    LAB RESULTS:  CMP     Component Value Date/Time   NA 141 03/21/2018 1417   NA 141 05/16/2017 1416   K 4.2 03/21/2018 1417   K 3.8 05/16/2017 1416   CL 105 03/21/2018 1417   CO2 25 03/21/2018 1417   CO2 27 05/16/2017 1416   GLUCOSE 105 (H) 03/21/2018 1417   GLUCOSE 140 05/16/2017 1416   BUN 15 03/21/2018 1417   BUN 13.2 05/16/2017 1416   CREATININE 0.77 03/21/2018 1417   CREATININE 0.80 09/22/2017 1555   CREATININE 0.8 05/16/2017 1416   CALCIUM 10.2 03/21/2018 1417   CALCIUM 11.0 (H) 05/16/2017 1416   PROT 6.7 03/21/2018 1417   PROT 6.9  05/16/2017 1416   ALBUMIN 3.8 03/21/2018 1417   ALBUMIN 3.6 05/16/2017 1416   AST 14 (L) 03/21/2018 1417   AST 13 05/16/2017 1416   ALT 14 03/21/2018 1417   ALT <6 05/16/2017 1416   ALKPHOS 83 03/21/2018 1417   ALKPHOS 130 05/16/2017 1416   BILITOT 0.3 03/21/2018 1417   BILITOT 0.31 05/16/2017 1416   GFRNONAA >60 03/21/2018 1417   GFRNONAA 75 08/19/2015  [place] 03/21/2018 1417   GFRAA 87 08/19/2015 1602    I No results found for: SPEP  Lab Results  Component Value Date   WBC 7.3 04/17/2018   NEUTROABS 5.1 04/17/2018   HGB 13.3 04/17/2018   HCT 41.8 04/17/2018   MCV 90.3 04/17/2018   PLT 248 04/17/2018      Chemistry      Component Value Date/Time   NA 141 03/21/2018 1417   NA 141 05/16/2017 1416   K 4.2 03/21/2018 1417   K 3.8 05/16/2017 1416   CL 105 03/21/2018 1417   CO2 25 03/21/2018 1417   CO2 27 05/16/2017 1416   BUN 15 03/21/2018 1417   BUN 13.2 05/16/2017 1416   CREATININE 0.77 03/21/2018 1417   CREATININE 0.80 09/22/2017 1555   CREATININE 0.8 05/16/2017 1416      Component Value Date/Time   CALCIUM 10.2 03/21/2018 1417   CALCIUM 11.0 (H) 05/16/2017 1416   ALKPHOS 83 03/21/2018 1417   ALKPHOS 130 05/16/2017 1416   AST 14 (L) 03/21/2018 1417   AST 13 05/16/2017 1416   ALT 14 03/21/2018 1417   ALT <6 05/16/2017 1416   BILITOT 0.3 03/21/2018 1417   BILITOT 0.31 05/16/2017 1416       No results found for: LABCA2  No components found for: LABCA125  Recent Labs  Lab 04/17/18 1309  INR 2.46    Urinalysis    Component Value Date/Time   COLORURINE STRAW (A) 02/20/2018 1606   APPEARANCEUR CLEAR 02/20/2018 1606   LABSPEC 1.009 02/20/2018 1606   LABSPEC 1.010 09/22/2016 1553   PHURINE 6.0 02/20/2018 1606   GLUCOSEU NEGATIVE 02/20/2018 1606   GLUCOSEU Negative 09/22/2016 1553   HGBUR NEGATIVE 02/20/2018 1606   BILIRUBINUR Negative 03/16/2018 1433   BILIRUBINUR Negative 09/22/2016 1553   KETONESUR NEGATIVE 02/20/2018 1606    PROTEINUR Negative 03/16/2018 1433   PROTEINUR NEGATIVE 02/20/2018 1606   UROBILINOGEN 0.2 03/16/2018 1433   UROBILINOGEN 0.2 11/15/2016 1358   UROBILINOGEN 0.2 09/22/2016 1553   NITRITE Negative 03/16/2018 1433   NITRITE NEGATIVE 02/20/2018 1606   LEUKOCYTESUR Negative 03/16/2018 1433   LEUKOCYTESUR Negative 09/22/2016 1553    STUDIES: No results found.  ASSESSMENT: 77 y.o. [place] woman status post right breast upper outer quadrant lumpectomy and sentinel lymph node sampling 09/09/2013 for an mpT1c pN1a, stage IIA invasive ductal carcinoma, estrogen and progesterone receptor both 100% positive with strong staining intensity, MIB-1 of 17% and no HER-2 amplification  (1) additional surgery for margin clearance 09/16/2013 obtained negative margins  (2) Oncotype DX recurrence score of 4 predicts a risk of outside the breast recurrence within 10 years of 7% if the patient's only systemic therapy is tamoxifen for 5 years. It also predicts no benefit from chemotherapy  (3) adjuvant radiation completed 01/07/2014  (4) anastrozole started 02/27/2014 stopped within 2 weeks because of arm swelling.   (a) bone density April 2016 showed osteopenia, with a t-score of -1.6  (b) anastrozole resumed 12/17/2015  (c) Bone density 09/20/2016 showed a T score of -2.2  (5) history of left lower extremity DVT 11/23/2012, initially on rivaroxaban, which caused chest pain, switch to Coumadin July 2014  (b) coumadin dose increased to 7.5/10 mg alternating days as of 06/13/2017   METASTATIC DISEASE: August 2018 (6) status post left total hip replacement 01/16/2017 for estrogen receptor positive adenocarcinoma.  (a) CA 27-29 was 46.4 as of 04/18/2017  (b) chest CT scan 05/02/2017 shows no lung or liver  lesion concern; it does show aortic atherosclerosis  (c) baseline bone scan 05/02/2017 was negative  (d) PET scan 09/12/2017 shows no active disease, including bone  (7) fulvestrant started  04/18/2017  (8) denosumab/Xgeva started 05/16/2017  (a) changed to every 12-weeks after 10/03/2017 dose   PLAN:  Alicia Vasquez is doing moderately well today.  She is tolerating the fulvestrant and xgeva well and will conitnue this.  I reviewed her case with Dr. Jana Hakim.  She will undergo hip xray today to evaluate the increase in swelling.  She has scans on 05/29/2018 and will review those results with Dr. Jana Hakim in 05/2018.  I do not palpate a breast concern today.  I let Willy know to let me know if it worsens, or recurs, as we can always do mammogarm and ultrasound.  She would like to hold off on this for now.  Alicia Vasquez needs a refill on her pain medications, which is helping her functionality.  She is taking these appropriately, and her pain is under control.  I reviewed her pain regimen with Dr. Jana Hakim.  I reviewed PMP aware.  I refilled her Norco via impravata accordingly.  Alicia Vasquez will return in 4 weeks for labs and her next injection.  They know to call for any other issues that may develop before that visit.  A total of (30) minutes of face-to-face time was spent with this patient with greater than 50% of that time in counseling and care-coordination.  Wilber Bihari, NP  04/17/18 1:52 PM Medical Oncology and Hematology [facility] [address] [place], [place] [address] Tel. [phone]    Fax. [phone]

## 2018-04-18 ENCOUNTER — Telehealth: Payer: Self-pay

## 2018-04-18 ENCOUNTER — Other Ambulatory Visit: Payer: Medicare Other

## 2018-04-18 ENCOUNTER — Ambulatory Visit: Payer: Medicare Other

## 2018-04-18 ENCOUNTER — Telehealth: Payer: Self-pay | Admitting: Adult Health

## 2018-04-18 LAB — CANCER ANTIGEN 27.29: CA 27.29: 38.5 U/mL (ref 0.0–38.6)

## 2018-04-18 NOTE — Telephone Encounter (Signed)
-----   Message from Gardenia Phlegm, NP sent at 04/18/2018 11:12 AM EST ----- Hip xray shows that the hip and hardware is stable ----- Message ----- From: Interface, Rad Results In Sent: 04/18/2018   8:18 AM EST To: Gardenia Phlegm, NP

## 2018-04-18 NOTE — Telephone Encounter (Signed)
Spoke with patient to inform of xray results, hip and hardware stable.  Patient voiced understanding.

## 2018-04-18 NOTE — Telephone Encounter (Signed)
No los °

## 2018-05-10 ENCOUNTER — Other Ambulatory Visit: Payer: Self-pay | Admitting: Oncology

## 2018-05-11 ENCOUNTER — Telehealth: Payer: Self-pay | Admitting: Oncology

## 2018-05-11 NOTE — Telephone Encounter (Signed)
R/S appt per 12/12 sch message - pt is aware of appt date and time.

## 2018-05-16 ENCOUNTER — Inpatient Hospital Stay: Payer: Medicare Other

## 2018-05-16 ENCOUNTER — Inpatient Hospital Stay: Payer: Medicare Other | Attending: Oncology

## 2018-05-16 DIAGNOSIS — I82401 Acute embolism and thrombosis of unspecified deep veins of right lower extremity: Secondary | ICD-10-CM

## 2018-05-16 DIAGNOSIS — C50411 Malignant neoplasm of upper-outer quadrant of right female breast: Secondary | ICD-10-CM

## 2018-05-16 DIAGNOSIS — Z923 Personal history of irradiation: Secondary | ICD-10-CM | POA: Diagnosis not present

## 2018-05-16 DIAGNOSIS — I700 Atherosclerosis of aorta: Secondary | ICD-10-CM

## 2018-05-16 DIAGNOSIS — Z170 Estrogen receptor positive status [ER+]: Secondary | ICD-10-CM

## 2018-05-16 DIAGNOSIS — M899 Disorder of bone, unspecified: Secondary | ICD-10-CM

## 2018-05-16 DIAGNOSIS — G893 Neoplasm related pain (acute) (chronic): Secondary | ICD-10-CM

## 2018-05-16 DIAGNOSIS — C7951 Secondary malignant neoplasm of bone: Secondary | ICD-10-CM | POA: Insufficient documentation

## 2018-05-16 DIAGNOSIS — Z79818 Long term (current) use of other agents affecting estrogen receptors and estrogen levels: Secondary | ICD-10-CM | POA: Insufficient documentation

## 2018-05-16 DIAGNOSIS — D6859 Other primary thrombophilia: Secondary | ICD-10-CM

## 2018-05-16 DIAGNOSIS — M8580 Other specified disorders of bone density and structure, unspecified site: Secondary | ICD-10-CM

## 2018-05-16 DIAGNOSIS — M84659P Pathological fracture in other disease, hip, unspecified, subsequent encounter for fracture with malunion: Secondary | ICD-10-CM

## 2018-05-16 DIAGNOSIS — M84459A Pathological fracture, hip, unspecified, initial encounter for fracture: Secondary | ICD-10-CM

## 2018-05-16 LAB — CBC WITH DIFFERENTIAL/PLATELET
Abs Immature Granulocytes: 0.03 10*3/uL (ref 0.00–0.07)
BASOS ABS: 0 10*3/uL (ref 0.0–0.1)
BASOS PCT: 0 %
EOS ABS: 0.2 10*3/uL (ref 0.0–0.5)
EOS PCT: 3 %
HEMATOCRIT: 41.5 % (ref 36.0–46.0)
Hemoglobin: 13.1 g/dL (ref 12.0–15.0)
IMMATURE GRANULOCYTES: 0 %
LYMPHS ABS: 1.1 10*3/uL (ref 0.7–4.0)
Lymphocytes Relative: 17 %
MCH: 28.2 pg (ref 26.0–34.0)
MCHC: 31.6 g/dL (ref 30.0–36.0)
MCV: 89.4 fL (ref 80.0–100.0)
Monocytes Absolute: 0.9 10*3/uL (ref 0.1–1.0)
Monocytes Relative: 13 %
NEUTROS PCT: 67 %
NRBC: 0 % (ref 0.0–0.2)
Neutro Abs: 4.6 10*3/uL (ref 1.7–7.7)
Platelets: 231 10*3/uL (ref 150–400)
RBC: 4.64 MIL/uL (ref 3.87–5.11)
RDW: 14.6 % (ref 11.5–15.5)
WBC: 6.8 10*3/uL (ref 4.0–10.5)

## 2018-05-16 LAB — PROTIME-INR
INR: 2.13
Prothrombin Time: 23.5 seconds — ABNORMAL HIGH (ref 11.4–15.2)

## 2018-05-16 MED ORDER — FULVESTRANT 250 MG/5ML IM SOLN
INTRAMUSCULAR | Status: AC
  Filled 2018-05-16: qty 5

## 2018-05-16 MED ORDER — FULVESTRANT 250 MG/5ML IM SOLN
500.0000 mg | Freq: Once | INTRAMUSCULAR | Status: AC
  Administered 2018-05-16: 500 mg via INTRAMUSCULAR

## 2018-05-25 ENCOUNTER — Other Ambulatory Visit: Payer: Self-pay

## 2018-05-25 NOTE — Progress Notes (Signed)
error 

## 2018-05-28 ENCOUNTER — Other Ambulatory Visit: Payer: Self-pay

## 2018-05-29 ENCOUNTER — Encounter (HOSPITAL_COMMUNITY): Payer: Self-pay | Admitting: Radiology

## 2018-05-29 ENCOUNTER — Other Ambulatory Visit (HOSPITAL_COMMUNITY): Payer: Medicare Other

## 2018-05-29 ENCOUNTER — Ambulatory Visit (HOSPITAL_COMMUNITY)
Admission: RE | Admit: 2018-05-29 | Discharge: 2018-05-29 | Disposition: A | Discharge status: Home / Self Care | Payer: Medicare Other | Source: Ambulatory Visit | Attending: Oncology | Admitting: Oncology

## 2018-05-29 ENCOUNTER — Other Ambulatory Visit: Payer: Self-pay | Admitting: *Deleted

## 2018-05-29 DIAGNOSIS — Z170 Estrogen receptor positive status [ER+]: Secondary | ICD-10-CM | POA: Diagnosis not present

## 2018-05-29 DIAGNOSIS — C7951 Secondary malignant neoplasm of bone: Secondary | ICD-10-CM

## 2018-05-29 DIAGNOSIS — C50911 Malignant neoplasm of unspecified site of right female breast: Secondary | ICD-10-CM | POA: Diagnosis not present

## 2018-05-29 DIAGNOSIS — C50411 Malignant neoplasm of upper-outer quadrant of right female breast: Secondary | ICD-10-CM

## 2018-05-29 DIAGNOSIS — D6859 Other primary thrombophilia: Secondary | ICD-10-CM

## 2018-05-29 LAB — POCT I-STAT CREATININE: Creatinine, Ser: 0.7 mg/dL (ref 0.44–1.00)

## 2018-05-29 LAB — GLUCOSE, CAPILLARY: Glucose-Capillary: 101 mg/dL — ABNORMAL HIGH (ref 70–99)

## 2018-05-29 MED ORDER — FLUDEOXYGLUCOSE F - 18 (FDG) INJECTION: 1003.0000 | Freq: Once | INTRAVENOUS | Status: DC | PRN

## 2018-05-29 MED ORDER — FLUDEOXYGLUCOSE F - 18 (FDG) INJECTION
10.0300 | Freq: Once | INTRAVENOUS | Status: AC | PRN
  Administered 2018-05-29: 10.03 via INTRAVENOUS

## 2018-05-29 MED ORDER — IOHEXOL 300 MG/ML  SOLN
75.0000 mL | Freq: Once | INTRAMUSCULAR | Status: AC | PRN
  Administered 2018-05-29: 75 mL via INTRAVENOUS

## 2018-05-29 MED ORDER — SODIUM CHLORIDE (PF) 0.9 % IJ SOLN
INTRAMUSCULAR | Status: AC
  Filled 2018-05-29: qty 50

## 2018-05-31 ENCOUNTER — Other Ambulatory Visit: Payer: Self-pay | Admitting: Oncology

## 2018-05-31 DIAGNOSIS — M84559P Pathological fracture in neoplastic disease, hip, unspecified, subsequent encounter for fracture with malunion: Secondary | ICD-10-CM

## 2018-05-31 MED ORDER — HYDROCODONE-ACETAMINOPHEN 5-325 MG PO TABS: 1.0000 | ORAL_TABLET | Freq: Four times a day (QID) | ORAL | 0 refills | Status: DC | PRN

## 2018-06-01 ENCOUNTER — Telehealth: Payer: Self-pay

## 2018-06-01 NOTE — Telephone Encounter (Signed)
Spoke with patient to inform that CT chest results ok per MD. Patient pleased and voiced understanding.  Knows to call center with any issues/concerns.

## 2018-06-06 ENCOUNTER — Telehealth: Payer: Self-pay

## 2018-06-06 NOTE — Telephone Encounter (Signed)
Returned patient's call.  Patient requesting results of CT scan and Pet scan.  Also patient has developed cough.   Nurse reviewed results with Dr. Jana Hakim. Nurse informed pt no new areas of concerns.  Patient with cough X 3 days.  Clear mucous per pt.  Temp 99.0.  Nurse recommended plenty of fluids and OTC Robitussin. Dr. Jana Hakim agreeable.  Encouraged patient to call back if no improvement.  Patient voiced understanding and agreement.  No further needs at this time.

## 2018-06-08 ENCOUNTER — Ambulatory Visit: Payer: Medicare Other | Admitting: Endocrinology

## 2018-06-13 ENCOUNTER — Inpatient Hospital Stay: Payer: Medicare Other

## 2018-06-13 ENCOUNTER — Encounter: Payer: Self-pay | Admitting: Adult Health

## 2018-06-13 ENCOUNTER — Ambulatory Visit: Payer: Medicare Other

## 2018-06-13 ENCOUNTER — Ambulatory Visit: Payer: Medicare Other | Admitting: Oncology

## 2018-06-13 ENCOUNTER — Inpatient Hospital Stay (HOSPITAL_BASED_OUTPATIENT_CLINIC_OR_DEPARTMENT_OTHER): Payer: Medicare Other | Admitting: Adult Health

## 2018-06-13 ENCOUNTER — Other Ambulatory Visit: Payer: Medicare Other

## 2018-06-13 ENCOUNTER — Inpatient Hospital Stay: Payer: Medicare Other | Attending: Oncology

## 2018-06-13 ENCOUNTER — Telehealth: Payer: Self-pay | Admitting: Oncology

## 2018-06-13 VITALS — BP 143/85 | HR 74 | Temp 98.5°F | Resp 18 | Ht 64.0 in | Wt 222.9 lb

## 2018-06-13 DIAGNOSIS — Z923 Personal history of irradiation: Secondary | ICD-10-CM | POA: Diagnosis not present

## 2018-06-13 DIAGNOSIS — M84459A Pathological fracture, hip, unspecified, initial encounter for fracture: Secondary | ICD-10-CM

## 2018-06-13 DIAGNOSIS — C7951 Secondary malignant neoplasm of bone: Secondary | ICD-10-CM | POA: Insufficient documentation

## 2018-06-13 DIAGNOSIS — C50411 Malignant neoplasm of upper-outer quadrant of right female breast: Secondary | ICD-10-CM

## 2018-06-13 DIAGNOSIS — Z79899 Other long term (current) drug therapy: Secondary | ICD-10-CM

## 2018-06-13 DIAGNOSIS — M899 Disorder of bone, unspecified: Secondary | ICD-10-CM

## 2018-06-13 DIAGNOSIS — Z9011 Acquired absence of right breast and nipple: Secondary | ICD-10-CM

## 2018-06-13 DIAGNOSIS — Z5111 Encounter for antineoplastic chemotherapy: Secondary | ICD-10-CM | POA: Insufficient documentation

## 2018-06-13 DIAGNOSIS — Z86718 Personal history of other venous thrombosis and embolism: Secondary | ICD-10-CM | POA: Diagnosis not present

## 2018-06-13 DIAGNOSIS — I82401 Acute embolism and thrombosis of unspecified deep veins of right lower extremity: Secondary | ICD-10-CM

## 2018-06-13 DIAGNOSIS — Z7901 Long term (current) use of anticoagulants: Secondary | ICD-10-CM | POA: Insufficient documentation

## 2018-06-13 DIAGNOSIS — D6859 Other primary thrombophilia: Secondary | ICD-10-CM

## 2018-06-13 DIAGNOSIS — I700 Atherosclerosis of aorta: Secondary | ICD-10-CM

## 2018-06-13 DIAGNOSIS — Z170 Estrogen receptor positive status [ER+]: Secondary | ICD-10-CM

## 2018-06-13 DIAGNOSIS — I10 Essential (primary) hypertension: Secondary | ICD-10-CM

## 2018-06-13 DIAGNOSIS — G893 Neoplasm related pain (acute) (chronic): Secondary | ICD-10-CM

## 2018-06-13 DIAGNOSIS — M84659P Pathological fracture in other disease, hip, unspecified, subsequent encounter for fracture with malunion: Secondary | ICD-10-CM

## 2018-06-13 DIAGNOSIS — M8580 Other specified disorders of bone density and structure, unspecified site: Secondary | ICD-10-CM

## 2018-06-13 LAB — CBC WITH DIFFERENTIAL/PLATELET
ABS IMMATURE GRANULOCYTES: 0.05 10*3/uL (ref 0.00–0.07)
Basophils Absolute: 0.1 10*3/uL (ref 0.0–0.1)
Basophils Relative: 1 %
Eosinophils Absolute: 0.2 10*3/uL (ref 0.0–0.5)
Eosinophils Relative: 3 %
HCT: 40.5 % (ref 36.0–46.0)
Hemoglobin: 13 g/dL (ref 12.0–15.0)
IMMATURE GRANULOCYTES: 1 %
Lymphocytes Relative: 15 %
Lymphs Abs: 1.1 10*3/uL (ref 0.7–4.0)
MCH: 28.5 pg (ref 26.0–34.0)
MCHC: 32.1 g/dL (ref 30.0–36.0)
MCV: 88.8 fL (ref 80.0–100.0)
Monocytes Absolute: 1.2 10*3/uL — ABNORMAL HIGH (ref 0.1–1.0)
Monocytes Relative: 15 %
NEUTROS PCT: 65 %
Neutro Abs: 4.9 10*3/uL (ref 1.7–7.7)
PLATELETS: 234 10*3/uL (ref 150–400)
RBC: 4.56 MIL/uL (ref 3.87–5.11)
RDW: 14.7 % (ref 11.5–15.5)
WBC: 7.5 10*3/uL (ref 4.0–10.5)
nRBC: 0 % (ref 0.0–0.2)

## 2018-06-13 LAB — PROTIME-INR
INR: 2.73
Prothrombin Time: 28.5 seconds — ABNORMAL HIGH (ref 11.4–15.2)

## 2018-06-13 MED ORDER — FULVESTRANT 250 MG/5ML IM SOLN
500.0000 mg | Freq: Once | INTRAMUSCULAR | Status: AC
  Administered 2018-06-13: 500 mg via INTRAMUSCULAR

## 2018-06-13 MED ORDER — DENOSUMAB 120 MG/1.7ML ~~LOC~~ SOLN
SUBCUTANEOUS | Status: AC
  Filled 2018-06-13: qty 1.7

## 2018-06-13 MED ORDER — AZITHROMYCIN 250 MG PO TABS: ORAL_TABLET | ORAL | 0 refills | Status: DC

## 2018-06-13 NOTE — Progress Notes (Signed)
[place]  Telephone:([phone]) [phone] Fax:(336) [phone]     ID: Alicia Vasquez OB: [birth year]  MR#: [id number]  JYN#:[id number]  PCP: Briscoe Deutscher, DO GYN:   SU: [name] OTHER MD: Thea Silversmith, Hart Robinsons, Philemon Kingdom  CHIEF COMPLAINT: Estrogen receptor positive breast cancer  CURRENT TREATMENT:  Fulvestrant and denosumab/Xgeva   INTERVAL HISTORY: Alicia Vasquez is here today for follow up and treatment of her metastatic estrogen receptor positive breast cancer accompanied by her friend and caregiver.   Alicia Vasquez receives fulvestrant every 4 weeks, and is tolerating it well.    She is also taking Coumadin as directed.  She is tolerating it well.  She isn't having easy bleeding.  She does have some occasional bruising.    Alicia Vasquez also receives Alicia Vasquez with a dose due today, however she notes worsening in a molar that has broken off and increased pain associated with this.  Her last dose of Xgeva was 12 weeks ago.    Since her last visit she underwent a CT chest and PET scan that showed no evidence of progressive disease.     REVIEW OF SYSTEMS: Tyauna continues to have left hip and leg pain since her hip replacement surgery.  She continues on norco one tablet four times per day and that keeps her functioning.  She denies any constipation or increased somnolence on this.  Her pain is controlled.    Alicia Vasquez has also had increasing nasal congestion and sinus pain for several weeks now.  She thinks she is coming down with a sinus infection and wants to know what to do.  She has several antibiotic allergies and notes a zpak typically works for her URIs.  She has an improving cough that is occasionally productive of clear sputum.    Alicia Vasquez is otherwise doing well.  She denies any unsual headaches or vision changes.  Her strength remains at baseline.  She continues to live with her friend and caregiver.  She denies nausea, vomiting, bowel/bladder changes.  She is without cough,  shortness of breath, or chest pain.  A detailed ROS was otherwise non contributory.    BREAST CANCER HISTORY: From doctor [name]'s intake node 07/24/2013:  "79 y.o. female. Who presented with SOB and had a CT chest performed that revealed a right breast mass. Mammogram/ultrsound on 2/13 showed a mass in the 11 o'clock position in the right breast measuring 1.5 cm. Also noted was a right axillary LN measuring 1.6 cm. MRI not performed. Biopsy of mass and lymph done. 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 Lymph node + for metastatic carcinoma."  [On 09/09/2013 the patient underwent right lumpectomy and sentinel lymph node sampling. This showed (SZA [id number]) multifocal invasive ductal carcinoma, grade 1, the largest lesion measuring 1.8 cm, the second lesion 1.2 cm. One of 4 sentinel lymph nodes was positive, with extracapsular extension. Margins were positive. HER-2 was repeated and was again negative. Further surgery 09/16/2013 obtained clear margins.  Her subsequent history is as detailed below  PAST MEDICAL HISTORY: Past Medical History:  Diagnosis Date  . Allergy   . Anxiety   . Arthritis   . Blood transfusion without reported diagnosis   . Breast cancer ([place]) 07/12/2013   Invasive Mammary Carcinoma  . DVT (deep vein thrombosis) in pregnancy   . Hypertension   . Hypothyroid   . Personal history of radiation therapy   . Pneumonia   .  Radiation 11/21/13-01/07/14   Right Breast/Supraclavicular    PAST SURGICAL HISTORY: Past Surgical History:  Procedure Laterality Date  . BREAST LUMPECTOMY Left 2015  . BREAST LUMPECTOMY WITH RADIOACTIVE SEED LOCALIZATION Right 09/09/2013   Procedure: BREAST LUMPECTOMY WITH RADIOACTIVE SEED LOCALIZATION WITH AXILLARY NODE EXCISION;  Surgeon: [name], MD;  Location: [facility];  Service: General;  Laterality:  Right;  . DENTAL SURGERY  04/19/2012   13 TEETH REMOVED  . DILATION AND CURETTAGE OF UTERUS    . ORIF PERIPROSTHETIC FRACTURE Left 01/31/2017   Procedure: REVISION and OPEN REDUCTION INTERNAL FIXATION (ORIF) PERIPROSTHETIC FRACTURE LEFT HIP;  Surgeon: Paralee Cancel, MD;  Location: [facility];  Service: Orthopedics;  Laterality: Left;  120 mins  . RE-EXCISION OF BREAST LUMPECTOMY Right 09/24/2013   Procedure: RE-EXCISION OF RIGHT BREAST LUMPECTOMY;  Surgeon: [name], MD;  Location: [facility];  Service: General;  Laterality: Right;  . TOTAL HIP ARTHROPLASTY Left 01/16/2017   Procedure: TOTAL HIP ARTHROPLASTY POSTERIOR;  Surgeon: Paralee Cancel, MD;  Location: [facility];  Service: Orthopedics;  Laterality: Left;    FAMILY HISTORY Family History  Problem Relation Age of Onset  . Heart disease Brother   . Colon cancer Brother   . Prostate cancer Brother    the patient's father died at the age of 69 after an automobile accident. The patient's mother died at the age of 74. She was a Marine scientist here in [name] in the old [facility]. She was infected with polio and was confined to a wheelchair for a good part of her life. She eventually died of pneumonia. The patient had one brother, who died with prostate cancer. She had no sisters. There is no history of breast or ovarian cancer in the family.  GYNECOLOGIC HISTORY:  Menarche age 52, first live birth age 12, the patient is GX P1. She went through the change of life at age 69. 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 She is widowed. Currently she is staying with her friend Alicia Vasquez,  72,who is a retired Radio producer. The patient's son Alicia Vasquez lives in [place]. He works an Engineer, technical sales. The patient has no grandchildren. She is a Tourist information centre manager but currently attends a [employer] with her friend Alicia Vasquez   ADVANCED DIRECTIVES: Not in place   HEALTH  MAINTENANCE: Social History   Tobacco Use  . Smoking status: Never Smoker  . Smokeless tobacco: Never Used  Substance Use Topics  . Alcohol use: No    Alcohol/week: 0.0 standard drinks  . Drug use: No     Colonoscopy: Never  PAP:  Bone density: 09/20/2016 showed a T score of -2.2  Lipid panel:  Allergies  Allergen Reactions  . Anesthetics, Amide Hypertension  . Benadryl [Diphenhydramine Hcl] Other (See Comments)    Dizziness  . Carbocaine [Mepivacaine Hcl] Hypertension  . Codeine Other (See Comments)    Dizziness  . Epinephrine Hypertension  . Sulfa Antibiotics Other (See Comments)    dizziness  . Latex Other (See Comments)    Blisters in mouth  . Tramadol     Sedation.   Marland Kitchen Penicillins Rash    Has patient had a PCN reaction causing immediate rash, facial/tongue/throat swelling, SOB or lightheadedness with hypotension: Unknown Has patient had a PCN reaction causing severe rash involving mucus membranes or skin necrosis: Unknown Has patient had a PCN reaction that required hospitalization: Unknown Has patient had a  PCN reaction occurring within the last 10 years: Unknown If all of the above answers are "NO", then may proceed with Cephalosporin use.     Current Outpatient Medications  Medication Sig Dispense Refill  . Cholecalciferol (VITAMIN D3) 5000 UNITS TABS Take 5,000 Units by mouth daily.     Marland Kitchen HYDROcodone-acetaminophen (NORCO/VICODIN) 5-325 MG tablet Take 1 tablet by mouth every 6 (six) hours as needed for moderate pain. 120 tablet 0  . levothyroxine (SYNTHROID) 125 MCG tablet Take 1 tablet (125 mcg total) by mouth daily before breakfast. Dispense as written: Synthroid LAST REFILL 90 tablet 3  . vitamin C (ASCORBIC ACID) 250 MG tablet Take 250 mg by mouth 3 (three) times daily.    Marland Kitchen warfarin (COUMADIN) 5 MG tablet TAKE 1.5-2 TABLETS ONE TIME ONLY AT 6 PM FOR 1 DOSE. TAKE 7.5 MG ALTERNATING WITH 10 MG DAILY 90 tablet 4  . azithromycin (ZITHROMAX) 250 MG tablet 2  tabs on day 1 then 1 tab daily until complete 6 each 0   No current facility-administered medications for this visit.     OBJECTIVE:  Vitals:   06/13/18 1125  BP: (!) 143/85  Pulse: 74  Resp: 18  Temp: 98.5 F (36.9 C)  SpO2: 98%     Body mass index is 38.26 kg/m.    ECOG FS: 2 - Symptomatic, <50% confined to bed GENERAL: Patient is a chronically ill appearing woman in wheelchair, accompanied by her friend and caregiver HEENT:  Sclerae anicteric.  Oropharynx clear and moist. No ulcerations or evidence of oropharyngeal candidiasis. Neck is supple.  NODES:  No cervical, supraclavicular, or axillary lymphadenopathy palpated.  BREAST EXAM:  Patient right breast examined, no sign of local recurrence noted. S/p lumpectomy LUNGS:  Clear to auscultation bilaterally.  No wheezes or rhonchi. HEART:  Regular rate and rhythm. No murmur appreciated. ABDOMEN:  Soft, nontender.  Positive, normoactive bowel sounds. No organomegaly palpated. MSK:  No focal spinal tenderness to palpation. EXTREMITIES:  No peripheral edema.   SKIN:  Clear with no obvious rashes or skin changes. No nail dyscrasia. NEURO:  Nonfocal. Well oriented.  Appropriate affect.    LAB RESULTS:  CMP     Component Value Date/Time   NA 141 03/21/2018 1417   NA 141 05/16/2017 1416   K 4.2 03/21/2018 1417   K 3.8 05/16/2017 1416   CL 105 03/21/2018 1417   CO2 25 03/21/2018 1417   CO2 27 05/16/2017 1416   GLUCOSE 105 (H) 03/21/2018 1417   GLUCOSE 140 05/16/2017 1416   BUN 15 03/21/2018 1417   BUN 13.2 05/16/2017 1416   CREATININE 0.70 05/29/2018 1021   CREATININE 0.77 03/21/2018 1417   CREATININE 0.80 09/22/2017 1555   CREATININE 0.8 05/16/2017 1416   CALCIUM 10.2 03/21/2018 1417   CALCIUM 11.0 (H) 05/16/2017 1416   PROT 6.7 03/21/2018 1417   PROT 6.9 05/16/2017 1416   ALBUMIN 3.8 03/21/2018 1417   ALBUMIN 3.6 05/16/2017 1416   AST 14 (L) 03/21/2018 1417   AST 13 05/16/2017 1416   ALT 14 03/21/2018 1417   ALT  <6 05/16/2017 1416   ALKPHOS 83 03/21/2018 1417   ALKPHOS 130 05/16/2017 1416   BILITOT 0.3 03/21/2018 1417   BILITOT 0.31 05/16/2017 1416   GFRNONAA >60 03/21/2018 1417   GFRNONAA 75 08/19/2015 1602   GFRAA >60 03/21/2018 1417   GFRAA 87 08/19/2015 1602    I No results found for: SPEP  Lab Results  Component Value Date   WBC  7.5 06/13/2018   NEUTROABS 4.9 06/13/2018   HGB 13.0 06/13/2018   HCT 40.5 06/13/2018   MCV 88.8 06/13/2018   PLT 234 06/13/2018      Chemistry      Component Value Date/Time   NA 141 03/21/2018 1417   NA 141 05/16/2017 1416   K 4.2 03/21/2018 1417   K 3.8 05/16/2017 1416   CL 105 03/21/2018 1417   CO2 25 03/21/2018 1417   CO2 27 05/16/2017 1416   BUN 15 03/21/2018 1417   BUN 13.2 05/16/2017 1416   CREATININE 0.70 05/29/2018 1021   CREATININE 0.77 03/21/2018 1417   CREATININE 0.80 09/22/2017 1555   CREATININE 0.8 05/16/2017 1416      Component Value Date/Time   CALCIUM 10.2 03/21/2018 1417   CALCIUM 11.0 (H) 05/16/2017 1416   ALKPHOS 83 03/21/2018 1417   ALKPHOS 130 05/16/2017 1416   AST 14 (L) 03/21/2018 1417   AST 13 05/16/2017 1416   ALT 14 03/21/2018 1417   ALT <6 05/16/2017 1416   BILITOT 0.3 03/21/2018 1417   BILITOT 0.31 05/16/2017 1416       No results found for: LABCA2  No components found for: LABCA125  Recent Labs  Lab 06/13/18 1114  INR 2.73    Urinalysis    Component Value Date/Time   COLORURINE STRAW (A) 02/20/2018 1606   APPEARANCEUR CLEAR 02/20/2018 1606   LABSPEC 1.009 02/20/2018 1606   LABSPEC 1.010 09/22/2016 1553   PHURINE 6.0 02/20/2018 1606   GLUCOSEU NEGATIVE 02/20/2018 1606   GLUCOSEU Negative 09/22/2016 1553   HGBUR NEGATIVE 02/20/2018 1606   BILIRUBINUR Negative 03/16/2018 1433   BILIRUBINUR Negative 09/22/2016 1553   KETONESUR NEGATIVE 02/20/2018 1606   PROTEINUR Negative 03/16/2018 1433   PROTEINUR NEGATIVE 02/20/2018 1606   UROBILINOGEN 0.2 03/16/2018 1433   UROBILINOGEN 0.2  11/15/2016 1358   UROBILINOGEN 0.2 09/22/2016 1553   NITRITE Negative 03/16/2018 1433   NITRITE NEGATIVE 02/20/2018 1606   LEUKOCYTESUR Negative 03/16/2018 1433   LEUKOCYTESUR Negative 09/22/2016 1553    STUDIES: Ct Chest W Contrast  Result Date: 05/29/2018 CLINICAL DATA:  Restaging right breast cancer with history of right mastectomy and radiation therapy. History of metastatic disease to the left hip. EXAM: CT CHEST WITH CONTRAST TECHNIQUE: Multidetector CT imaging of the chest was performed during intravenous contrast administration. CONTRAST:  65m OMNIPAQUE IOHEXOL 300 MG/ML  SOLN COMPARISON:  PET CT same date.  Chest CT 05/02/2017. FINDINGS: Cardiovascular: No acute vascular findings. Aortic valvular calcifications are present. The heart size is normal. There is no pericardial effusion. Mediastinum/Nodes: There are no enlarged mediastinal, hilar, internal mammary or axillary lymph nodes. There are stable small calcified right hilar and subcarinal lymph nodes. Focal air collection superiorly in the right mediastinum between the esophagus and right subclavian artery is unchanged from multiple prior studies, demonstrating no definite communication with the esophagus or trachea. The thyroid gland and trachea appear normal. There is a stable small hiatal hernia. Lungs/Pleura: There is no pleural effusion. There are stable radiation changes at the right apex. Calcified right lower lobe granulomas are noted. No suspicious pulmonary nodules. Stable tiny subpleural nodule in the right upper lobe (image 63/5). Upper abdomen: The visualized upper abdomen appears stable without acute findings or evidence of metastatic disease. Musculoskeletal/Chest wall: There is no chest wall mass or suspicious osseous finding. Stable chronic Schmorl's node involving the superior endplate of TV78 Stable postsurgical changes within the breasts. IMPRESSION: 1. Stable chest CT without evidence of local recurrence  or metastatic  disease. 2. Stable tiny subpleural nodule anteriorly in the right upper lobe, consistent with a benign finding. 3. Stable sequela of prior granulomatous disease. 4. Aortic valvular calcifications. Electronically Signed   By: Richardean Sale M.D.   On: 05/29/2018 14:04   Nm Pet Image Restag (ps) Skull Base To Thigh  Result Date: 05/29/2018 CLINICAL DATA:  Subsequent treatment strategy for right breast cancer diagnosed in 2015. EXAM: NUCLEAR MEDICINE PET SKULL BASE TO THIGH TECHNIQUE: 10.03 mCi F-18 FDG was injected intravenously. Full-ring PET imaging was performed from the skull base to thigh after the radiotracer. CT data was obtained and used for attenuation correction and anatomic localization. Fasting blood glucose: 101 mg/dl COMPARISON:  PET-CT 09/12/2017 FINDINGS: Mediastinal blood pool activity: SUV max 2.2 NECK: No hypermetabolic cervical lymph nodes are identified.There are no lesions of the pharyngeal mucosal space. Stable homogeneous thyroid activity. Incidental CT findings: Stable probable postinflammatory calcifications within the palatine tonsils and mild carotid atherosclerosis. CHEST: There are no hypermetabolic mediastinal, hilar or axillary lymph nodes. No suspicious pulmonary activity. Incidental CT findings: Stable pulmonary scarring at the right apex, calcified right lower lobe granuloma and calcified right hilar and mediastinal lymph nodes. There are stable postsurgical changes in both breasts. Probable aortic valvular calcifications. ABDOMEN/PELVIS: There is no hypermetabolic activity within the liver, adrenal glands, spleen or pancreas. There is no hypermetabolic nodal activity. Incidental CT findings: Calcified splenic granulomas, aortic and branch vessel atherosclerosis and probable pelvic floor laxity. SKELETON: There is no hypermetabolic activity to suggest osseous metastatic disease. Status post left total hip arthroplasty with a long femoral component. There is low-level activity  surrounding the femoral prosthesis, similar to the previous study (more fully imaged currently). Incidental CT findings: Mild lumbar spondylosis. IMPRESSION: 1. Stable examination without evidence of local recurrence or metastatic disease in the neck, chest, abdomen or pelvis. 2. Sequela of prior granulomatous disease. 3.  Aortic Atherosclerosis (ICD10-I70.0). Electronically Signed   By: Richardean Sale M.D.   On: 05/29/2018 13:53    ASSESSMENT: 78 y.o. [place] woman status post right breast upper outer quadrant lumpectomy and sentinel lymph node sampling 09/09/2013 for an mpT1c pN1a, stage IIA invasive ductal carcinoma, estrogen and progesterone receptor both 100% positive with strong staining intensity, MIB-1 of 17% and no HER-2 amplification  (1) additional surgery for margin clearance 09/16/2013 obtained negative margins  (2) Oncotype DX recurrence score of 4 predicts a risk of outside the breast recurrence within 10 years of 7% if the patient's only systemic therapy is tamoxifen for 5 years. It also predicts no benefit from chemotherapy  (3) adjuvant radiation completed 01/07/2014  (4) anastrozole started 02/27/2014 stopped within 2 weeks because of arm swelling.   (a) bone density April 2016 showed osteopenia, with a t-score of -1.6  (b) anastrozole resumed 12/17/2015  (c) Bone density 09/20/2016 showed a T score of -2.2  (5) history of left lower extremity DVT 11/23/2012, initially on rivaroxaban, which caused chest pain, switch to Coumadin July 2014  (b) coumadin dose increased to 7.5/10 mg alternating days as of 06/13/2017   METASTATIC DISEASE: August 2018 (6) status post left total hip replacement 01/16/2017 for estrogen receptor positive adenocarcinoma.  (a) CA 27-29 was 46.4 as of 04/18/2017  (b) chest CT scan 05/02/2017 shows no lung or liver lesion concern; it does show aortic atherosclerosis  (c) baseline bone scan 05/02/2017 was negative  (d) PET scan 09/12/2017 shows no  active disease, including bone  (e) PET scan and CT chest on 05/29/2018  show no active disease  (7) fulvestrant started 04/18/2017  (8) denosumab/Xgeva started 05/16/2017  (a) changed to every 12-weeks after 10/03/2017 dose   (b) held starting with 06/13/2018 dose due to dental concern  PLAN:  Alicia Vasquez is doing well today.  I reviewed her scans with her which show no active disease.  She is happy with these results.  She will continue on Fulvestrant every 4 weeks and will receive her next dose due today.  Since she has this dental concern, we will hold the xgeva today and see if she can get in with Dr. Enrique Sack for evaluation prior to resuming.    Alicia Vasquez will continue on Norco for her pain, it is controlling her cancer related pain, and she has no red flags or issues tolerating the medications.  She does not need a refill today.  Alicia Vasquez has what is likely a sinusitis since she has had continued congestion that hasn't improved for several weeks.  She has several antibiotic allergies.  I prescribed a zpak for her to take.  I reviewed the potential for interaction between the zpak and Coumadin.  I let her know that the Zpak could make her coumadin level increase and increase her propensity to bleed.  They should let us know if she notes any easy bruising/bleeding.  We will recheck her INR in one week, or sooner if needed.  Alicia Vasquez understands the risk.  Alicia Vasquez will return every four weeks for labs and her fulvestrant.  We will see her for an office visit in March.  She knows to call for any questions or concerns prior to her next appointment with [name].    A total of (30) minutes of face-to-face time was spent with this patient with greater than 50% of that time in counseling and care-coordination.  Wilber Bihari, NP  06/13/18 1:47 PM Medical Oncology and Hematology [facility] [address] [place], [place] [address] Tel. [phone]    Fax. [phone]

## 2018-06-13 NOTE — Telephone Encounter (Signed)
Gave avs and calendar ° °

## 2018-06-14 ENCOUNTER — Ambulatory Visit (INDEPENDENT_AMBULATORY_CARE_PROVIDER_SITE_OTHER): Payer: Medicare Other | Admitting: Internal Medicine

## 2018-06-14 ENCOUNTER — Telehealth (HOSPITAL_COMMUNITY): Payer: Self-pay

## 2018-06-14 ENCOUNTER — Encounter: Payer: Self-pay | Admitting: Internal Medicine

## 2018-06-14 VITALS — BP 120/60 | HR 85 | Ht 64.0 in | Wt 222.0 lb

## 2018-06-14 DIAGNOSIS — E213 Hyperparathyroidism, unspecified: Secondary | ICD-10-CM | POA: Diagnosis not present

## 2018-06-14 DIAGNOSIS — E559 Vitamin D deficiency, unspecified: Secondary | ICD-10-CM | POA: Diagnosis not present

## 2018-06-14 DIAGNOSIS — E039 Hypothyroidism, unspecified: Secondary | ICD-10-CM | POA: Diagnosis not present

## 2018-06-14 LAB — CANCER ANTIGEN 27.29: CA 27.29: 36.8 U/mL (ref 0.0–38.6)

## 2018-06-14 NOTE — Patient Instructions (Addendum)
Please continue vitamin D 5000 units daily.  Please continue Levothyroxine 125 mcg daily.  Take the thyroid hormone every day, with water, at least 30 minutes before breakfast, separated by at least 4 hours from: - acid reflux medications - calcium - iron - multivitamins  Please come back for a follow-up appointment in 1 year.

## 2018-06-14 NOTE — Progress Notes (Signed)
Patient ID: Alicia Vasquez, female   DOB: Jul 30, 1940, 78 y.o.   MRN: [id number]   HPI  AARUSHI Vasquez is a 78 y.o.-year-old female, returning for follow-up for hyperparathyroidism and severe vitamin D deficiency, hypothyroidism. Last visit 1 year ago.  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  Continues Xgeva, but did not get the injection that was scheduled for yesterday >> not given b/c tooth pb >> will see the dentist.  Recent PET scan >> stability of her cancerous lesions.  Reviewed and addended history: Pt was dx with found to have an elevated parathyroid hormone in 02/2014.   She also had high calcium levels >> highest 11.5.  I reviewed pt's pertinent labs. Lab Results  Component Value Date   PTH 46 05/05/2017   PTH 26 12/06/2016   PTH Comment 12/06/2016   PTH 31 12/07/2015   PTH Comment 12/07/2015   PTH 37 06/09/2015   PTH Comment 06/09/2015   PTH 37 12/19/2014   PTH Comment 12/19/2014   PTH 88 (H) 07/27/2014   CALCIUM 10.2 03/21/2018   CALCIUM 10.9 (H) 01/23/2018   CALCIUM 10.6 (H) 12/26/2017   CALCIUM 10.6 (H) 11/28/2017   CALCIUM 10.3 10/31/2017   CALCIUM 10.6 (H) 10/03/2017   CALCIUM 10.3 09/22/2017   CALCIUM 10.3 09/05/2017   CALCIUM 10.6 (H) 08/08/2017   CALCIUM 10.1 07/11/2017   Vitamin D deficiency-PTH level normalized after normalization of her vitamin D levels, however, calcium levels remain elevated.  Pt was taking 1000 IU vitamin D in the past - a level was very low then >> we increased her vitamin D supplement to 5000 units daily >> vitamin D level normalized.  She is taking this dose consistently now.  Reviewed latest vitamin D levels-normal: Lab Results  Component Value Date   VD25OH 63 05/05/2017   VD25OH 45.98 12/06/2016   VD25OH 47.68 12/07/2015   VD25OH 45.83 06/09/2015   VD25OH 30.58 12/19/2014   VD25OH 26.82 (L) 10/17/2014   VD25OH 7.19 (L) 04/18/2014   Magnesium, phosphorus, calcitriol levels were  normal: Component     Latest Ref Rng 04/18/2014         3:04 PM  Vitamin D 1, 25 (OH) Total     18 - 72 pg/mL 55  Vitamin D3 1, 25 (OH)      55  Vitamin D2 1, 25 (OH)      <8  Magnesium     1.5 - 2.5 mg/dL 2.1  Phosphorus     2.3 - 4.6 mg/dL 2.8   I did advise her to perform a 24-hour urine collection, but she did not do this yet.  Reviewed previous DXA scan reports: - 09/03/2014: no osteoporosis/but osteopenia at LFN: -1.6 - 08/2016: Osteopenia RFN: -2.2, LFN: -2.1; R radius 33% distal: -0.2  She had the following fractures: - 1994: R foot  - 1990's: L hairline fracture  - 12/2016: Periprosthetic pathological hip fracture  She continues on Xgeva, started before last visit.  No history of kidney stones.  No CKD. Last BUN/Cr: Lab Results  Component Value Date   BUN 15 03/21/2018   CREATININE 0.70 05/29/2018   She also has a history of breast cancer and is on Arimidex. She had RxTx. No ChTx.  Hypothyroidism:  Pt is on levothyroxine 125 mcg daily, taken: - in am - fasting - at least 30 min from b'fast - no Ca, Fe, MVI, PPIs - not on Biotin  Last TSH normal: Lab Results  Component Value Date   TSH 2.23 03/16/2018   Pt denies: - feeling nodules in neck - dysphagia - choking - SOB with lying down But does have hoarseness, which is longstanding for her.  ROS: Constitutional: no weight gain/no weight loss, no fatigue, no subjective hyperthermia, no subjective hypothermia Eyes: no blurry vision, no xerophthalmia ENT: no sore throat,+ see HPI Cardiovascular: no CP/no SOB/no palpitations/no leg swelling Respiratory: no cough/no SOB/no wheezing Gastrointestinal: no N/no V/no D/no C/no acid reflux Musculoskeletal: no muscle aches/no joint aches Skin: no rashes, no hair loss Neurological: no tremors/+ foot numbness/no tingling/no dizziness  I reviewed pt's medications, allergies, PMH, social hx, family hx, and changes were documented in the history of present  illness. Otherwise, unchanged from my initial visit note.   Past Medical History:  Diagnosis Date  . Allergy   . Anxiety   . Arthritis   . Blood transfusion without reported diagnosis   . Breast cancer ([place]) 07/12/2013   Invasive Mammary Carcinoma  . DVT (deep vein thrombosis) in pregnancy   . Hypertension   . Hypothyroid   . Personal history of radiation therapy   . Pneumonia   . Radiation 11/21/13-01/07/14   Right Breast/Supraclavicular   Past Surgical History:  Procedure Laterality Date  . BREAST LUMPECTOMY Left 2015  . BREAST LUMPECTOMY WITH RADIOACTIVE SEED LOCALIZATION Right 09/09/2013   Procedure: BREAST LUMPECTOMY WITH RADIOACTIVE SEED LOCALIZATION WITH AXILLARY NODE EXCISION;  Surgeon: [name], MD;  Location: [facility];  Service: General;  Laterality: Right;  . DENTAL SURGERY  04/19/2012   13 TEETH REMOVED  . DILATION AND CURETTAGE OF UTERUS    . ORIF PERIPROSTHETIC FRACTURE Left 01/31/2017   Procedure: REVISION and OPEN REDUCTION INTERNAL FIXATION (ORIF) PERIPROSTHETIC FRACTURE LEFT HIP;  Surgeon: Paralee Cancel, MD;  Location: [facility];  Service: Orthopedics;  Laterality: Left;  120 mins  . RE-EXCISION OF BREAST LUMPECTOMY Right 09/24/2013   Procedure: RE-EXCISION OF RIGHT BREAST LUMPECTOMY;  Surgeon: [name], MD;  Location: [facility];  Service: General;  Laterality: Right;  . TOTAL HIP ARTHROPLASTY Left 01/16/2017   Procedure: TOTAL HIP ARTHROPLASTY POSTERIOR;  Surgeon: Paralee Cancel, MD;  Location: [facility];  Service: Orthopedics;  Laterality: Left;   History   Social History  . Marital Status: Widowed    Spouse Name: N/A    Number of Children: 1   Occupational History  . Retired Radio broadcast assistant   Social History Main Topics  . Smoking status: Never Smoker   . Smokeless tobacco: Not on file  . Alcohol Use: No  . Drug Use: No   Social History Narrative   Exercise yard work   Current Outpatient Medications on File Prior  to Visit  Medication Sig Dispense Refill  . azithromycin (ZITHROMAX) 250 MG tablet 2 tabs on day 1 then 1 tab daily until complete 6 each 0  . Cholecalciferol (VITAMIN D3) 5000 UNITS TABS Take 5,000 Units by mouth daily.     Marland Kitchen HYDROcodone-acetaminophen (NORCO/VICODIN) 5-325 MG tablet Take 1 tablet by mouth every 6 (six) hours as needed for moderate pain. 120 tablet 0  . levothyroxine (SYNTHROID) 125 MCG tablet Take 1 tablet (125 mcg total) by mouth daily before breakfast. Dispense as written: Synthroid LAST REFILL 90 tablet 3  . vitamin C (ASCORBIC ACID) 250 MG tablet Take 250 mg by mouth 3 (three) times daily.    Marland Kitchen warfarin (COUMADIN) 5 MG tablet TAKE 1.5-2 TABLETS ONE TIME ONLY AT 6 PM FOR 1 DOSE.  TAKE 7.5 MG ALTERNATING WITH 10 MG DAILY 90 tablet 4   No current facility-administered medications on file prior to visit.    Allergies  Allergen Reactions  . Anesthetics, Amide Hypertension  . Benadryl [Diphenhydramine Hcl] Other (See Comments)    Dizziness  . Carbocaine [Mepivacaine Hcl] Hypertension  . Codeine Other (See Comments)    Dizziness  . Epinephrine Hypertension  . Sulfa Antibiotics Other (See Comments)    dizziness  . Latex Other (See Comments)    Blisters in mouth  . Tramadol     Sedation.   Marland Kitchen Penicillins Rash    Has patient had a PCN reaction causing immediate rash, facial/tongue/throat swelling, SOB or lightheadedness with hypotension: Unknown Has patient had a PCN reaction causing severe rash involving mucus membranes or skin necrosis: Unknown Has patient had a PCN reaction that required hospitalization: Unknown Has patient had a PCN reaction occurring within the last 10 years: Unknown If all of the above answers are "NO", then may proceed with Cephalosporin use.    Family History  Problem Relation Age of Onset  . Heart disease Brother   . Colon cancer Brother   . Prostate cancer Brother    PE: BP 120/60 (BP Location: Left Arm)   Pulse 85   Ht 5\' 4"  (1.626 m)    Wt 222 lb (100.7 kg)   SpO2 93%   BMI 38.11 kg/m   Body mass index is 38.11 kg/m. Wt Readings from Last 3 Encounters:  06/14/18 222 lb (100.7 kg)  06/13/18 222 lb 14.4 oz (101.1 kg)  04/17/18 201 lb 6.4 oz (91.4 kg)   Constitutional: overweight, in NAD, walks with a walker Eyes: PERRLA, EOMI, no exophthalmos ENT: moist mucous membranes, no thyromegaly, no cervical lymphadenopathy Cardiovascular: RRR, No MRG, + bilateral LE edema Respiratory: CTA B Gastrointestinal: abdomen soft, NT, ND, BS+ Musculoskeletal: no deformities, strength intact in all 4 Skin: moist, warm, no rashes Neurological: no tremor with outstretched hands, DTR normal in all 4  Assessment: 1. Normocalcemic hyperparathyroidism  2. H/o Vitamin D deficiency  3. Hypothyroidism  Plan: 1. Hyperparathyroidism  -Patient has an interesting history of mostly normal calcium levels in the past with elevated PTH levels and low vitamin D.  Her PTH levels normalized after normalizing her vitamin D level however, calcium started to increase and remained elevated afterwards.  Calcitriol, magnesium, phosphorus, were all normal.  Vitamin D normal on 5000 units daily.  Most recent calcium level normalized after starting Xgeva. -She has no history of nephrolithiasis, no clear history of osteoporosis (of note 33% radius BMD was normal and higher than the rest of the scores on her that her DXA scan from 08/2016), but she has a history of a pelvic fracture which qualifies her for osteoporosis -Before last visit, she unfortunately developed left hip and breast cancer metastasis and she was started on Xgeva.  She had 2 surgeries and now has recurrent metastatic breast cancer and we discussed that we will hold off parathyroid surgery for now.  We will follow her clinically and biochemically. - I will see the patient back 1 year  2. H/o Severe vitamin D deficiency -Latest vitamin D level was normal before last visit -Continue 5000 units  vitamin D daily - recheck today  3. Hypothyroidism - latest thyroid labs reviewed with pt >> normal 02/2018 - she continues on LT4  125 mcg daily - pt feels good on this dose. - we discussed about taking the thyroid hormone every day, with water, >  30 minutes before breakfast, separated by >4 hours from acid reflux medications, calcium, iron, multivitamins. Pt. is taking it correctly. - will recheck today  Component     Latest Ref Rng & Units 06/14/2018          TSH     0.35 - 4.50 uIU/mL 1.33  Calcium     8.4 - 10.5 mg/dL 10.9 (H)  VITD     30.00 - 100.00 ng/mL 59.91  T4,Free(Direct)     0.60 - 1.60 ng/dL 1.21   Calcium is still slightly high.  TFTs normal.  Vitamin D normal.  We will continue to keep an eye on this.  Delton See also helping, but she skipped the most recent schedule dose due to dental problems.  Philemon Kingdom, MD PhD [facility] End[facility]in[facility]

## 2018-06-14 NOTE — Telephone Encounter (Signed)
Called and left message on machine to call back to schedule New Outpatient exam with Dental Medicine. Molli Posey

## 2018-06-15 LAB — TSH: TSH: 1.33 u[IU]/mL (ref 0.35–4.50)

## 2018-06-15 LAB — T4, FREE: Free T4: 1.21 ng/dL (ref 0.60–1.60)

## 2018-06-15 LAB — VITAMIN D 25 HYDROXY (VIT D DEFICIENCY, FRACTURES): VITD: 59.91 ng/mL (ref 30.00–100.00)

## 2018-06-15 LAB — CALCIUM: Calcium: 10.9 mg/dL — ABNORMAL HIGH (ref 8.4–10.5)

## 2018-06-18 ENCOUNTER — Telehealth: Payer: Self-pay

## 2018-06-18 NOTE — Telephone Encounter (Signed)
Left message for patient to return our call at [phone].  

## 2018-06-18 NOTE — Telephone Encounter (Signed)
-----   Message from Philemon Kingdom, MD sent at 06/15/2018  4:59 PM EST ----- Alicia Vasquez, can you please call pt: Calcium is still slightly high.  TFTs normal.  Vitamin D normal.  We will continue to keep an eye on this. I hope she can restart Xgeva soon.  This is also helping with lowering calcium.

## 2018-06-19 ENCOUNTER — Telehealth: Payer: Self-pay | Admitting: *Deleted

## 2018-06-19 DIAGNOSIS — C50411 Malignant neoplasm of upper-outer quadrant of right female breast: Secondary | ICD-10-CM

## 2018-06-19 DIAGNOSIS — C7951 Secondary malignant neoplasm of bone: Secondary | ICD-10-CM

## 2018-06-19 DIAGNOSIS — Z170 Estrogen receptor positive status [ER+]: Secondary | ICD-10-CM

## 2018-06-19 MED ORDER — AZITHROMYCIN 250 MG PO TABS: ORAL_TABLET | ORAL | 0 refills | Status: DC

## 2018-06-19 NOTE — Telephone Encounter (Signed)
Patient is returning call.  Please Advise, thanks

## 2018-06-19 NOTE — Telephone Encounter (Signed)
See other note

## 2018-06-19 NOTE — Telephone Encounter (Signed)
Notified patient of message from Dr. Gherghe, patient expressed understanding and agreement. No further questions.  

## 2018-06-19 NOTE — Telephone Encounter (Signed)
This RN returned pt's call -  Alicia Vasquez states since " getting those injections in both my buttocks there are knots and they are bruised "  This RN informed pt due to the type of medicine - the way it is made - it is common to have " knots " where the medicine went in. The bruising may be because the pt is on blood thinners.  Alicia Vasquez states this cycle of shots " seemed to hurt more and I thought maybe the medicine was pushed in too fast "  This RN discussed route and way medicine is given as well as recently where this office gets their drug from had to change because the usual company was out of medication. Informed her though the medicine is the same but that several patients who received this brand have complained that it seemed to hurt more.  This RN inquired about any redness at sites or increased warmth with Alicia Vasquez stating " no " .  With further discussion- she states if she had a preference for injections she feels the best outcome when Alicia Vasquez gives the shots to her.  Alicia Vasquez also stated she " lost the Z Pak after only taking it for one day " She thinks it might have fallen on the floor and may be under her bed but she is not sure. " just don't know where it went "  She states she is still congested, and hoarse.  This RN obtained refill of Z pak for pt.  No other needs at this time.  Alicia Vasquez will keep scheduled lab tomorrow due to need to recheck INR.

## 2018-06-20 ENCOUNTER — Inpatient Hospital Stay: Payer: Medicare Other

## 2018-06-20 ENCOUNTER — Ambulatory Visit (HOSPITAL_COMMUNITY): Payer: Medicare Other | Admitting: Dentistry

## 2018-06-20 ENCOUNTER — Encounter (HOSPITAL_COMMUNITY): Payer: Self-pay | Admitting: Dentistry

## 2018-06-20 VITALS — BP 171/70 | HR 81 | Temp 97.7°F

## 2018-06-20 DIAGNOSIS — Z86718 Personal history of other venous thrombosis and embolism: Secondary | ICD-10-CM | POA: Diagnosis not present

## 2018-06-20 DIAGNOSIS — Z5111 Encounter for antineoplastic chemotherapy: Secondary | ICD-10-CM | POA: Diagnosis not present

## 2018-06-20 DIAGNOSIS — C7951 Secondary malignant neoplasm of bone: Secondary | ICD-10-CM

## 2018-06-20 DIAGNOSIS — K0530 Chronic periodontitis, unspecified: Secondary | ICD-10-CM

## 2018-06-20 DIAGNOSIS — I10 Essential (primary) hypertension: Secondary | ICD-10-CM | POA: Diagnosis not present

## 2018-06-20 DIAGNOSIS — K06010 Localized gingival recession, unspecified: Secondary | ICD-10-CM

## 2018-06-20 DIAGNOSIS — I82401 Acute embolism and thrombosis of unspecified deep veins of right lower extremity: Secondary | ICD-10-CM

## 2018-06-20 DIAGNOSIS — C50411 Malignant neoplasm of upper-outer quadrant of right female breast: Secondary | ICD-10-CM

## 2018-06-20 DIAGNOSIS — K08409 Partial loss of teeth, unspecified cause, unspecified class: Secondary | ICD-10-CM

## 2018-06-20 DIAGNOSIS — Z170 Estrogen receptor positive status [ER+]: Secondary | ICD-10-CM | POA: Diagnosis not present

## 2018-06-20 DIAGNOSIS — K083 Retained dental root: Secondary | ICD-10-CM

## 2018-06-20 DIAGNOSIS — K0401 Reversible pulpitis: Secondary | ICD-10-CM

## 2018-06-20 DIAGNOSIS — M84459A Pathological fracture, hip, unspecified, initial encounter for fracture: Secondary | ICD-10-CM

## 2018-06-20 DIAGNOSIS — G893 Neoplasm related pain (acute) (chronic): Secondary | ICD-10-CM

## 2018-06-20 DIAGNOSIS — M264 Malocclusion, unspecified: Secondary | ICD-10-CM

## 2018-06-20 DIAGNOSIS — K0889 Other specified disorders of teeth and supporting structures: Secondary | ICD-10-CM

## 2018-06-20 DIAGNOSIS — K029 Dental caries, unspecified: Secondary | ICD-10-CM

## 2018-06-20 DIAGNOSIS — J392 Other diseases of pharynx: Secondary | ICD-10-CM

## 2018-06-20 DIAGNOSIS — M8580 Other specified disorders of bone density and structure, unspecified site: Secondary | ICD-10-CM

## 2018-06-20 DIAGNOSIS — I700 Atherosclerosis of aorta: Secondary | ICD-10-CM

## 2018-06-20 DIAGNOSIS — K036 Deposits [accretions] on teeth: Secondary | ICD-10-CM

## 2018-06-20 DIAGNOSIS — D6859 Other primary thrombophilia: Secondary | ICD-10-CM

## 2018-06-20 LAB — CBC WITH DIFFERENTIAL/PLATELET
Abs Immature Granulocytes: 0.03 10*3/uL (ref 0.00–0.07)
BASOS PCT: 1 %
Basophils Absolute: 0.1 10*3/uL (ref 0.0–0.1)
Eosinophils Absolute: 0.4 10*3/uL (ref 0.0–0.5)
Eosinophils Relative: 5 %
HCT: 42.2 % (ref 36.0–46.0)
Hemoglobin: 13.6 g/dL (ref 12.0–15.0)
Immature Granulocytes: 0 %
Lymphocytes Relative: 12 %
Lymphs Abs: 0.9 10*3/uL (ref 0.7–4.0)
MCH: 28.7 pg (ref 26.0–34.0)
MCHC: 32.2 g/dL (ref 30.0–36.0)
MCV: 89 fL (ref 80.0–100.0)
Monocytes Absolute: 0.8 10*3/uL (ref 0.1–1.0)
Monocytes Relative: 11 %
Neutro Abs: 5.2 10*3/uL (ref 1.7–7.7)
Neutrophils Relative %: 71 %
PLATELETS: 234 10*3/uL (ref 150–400)
RBC: 4.74 MIL/uL (ref 3.87–5.11)
RDW: 14.6 % (ref 11.5–15.5)
WBC: 7.3 10*3/uL (ref 4.0–10.5)
nRBC: 0 % (ref 0.0–0.2)

## 2018-06-20 LAB — PROTIME-INR
INR: 2.7
Prothrombin Time: 28.3 seconds — ABNORMAL HIGH (ref 11.4–15.2)

## 2018-06-20 NOTE — Progress Notes (Signed)
DENTAL CONSULTATION  Date of Consultation:  06/20/2018 Patient Name:   Alicia Vasquez Date of Birth:   [birth year] Medical Record Number: [id number]  VITALS: BP (!) 171/70 (BP Location: Left Arm)   Pulse 81   Temp 97.7 F (36.5 C)   CHIEF COMPLAINT: Patient referred by Dr. Jana Hakim for a dental consultation.  HPI: Alicia Vasquez is a 78 year old female with history of right breast cancer. The patient also has a history of bone metastases with 10 doses of Xgeva therapy administered from December of 2018 through October of 2019.  Patient is now referred for evaluation of upper left quadrant tooth pain.  Patient gives a history of upper left quadrant tooth pain and points to tooth #12 as the offending tooth.   Patient has history of intermittent, dull achy pain.  This pain is relieved with the hydrocodone pain medication that she uses.  Patient currently denies acute toothache pain today.  The patient last saw her primary dentist, Dr. Oneita Jolly, in [place], [place] in 2015 for evaluation of a broken tooth.  Patient was then referred to an oral surgeon for evaluation for extraction.  No treatment was provided, however.  Prior to that, the patient had been seen by an oral surgeon for extraction of 13 teeth in November of 2013. Patient denies having any complications from that dental treatment. The patient denies having any partial dentures. The patient denies having dental phobia.  Patient indicates that she is allergic to local anesthetics from previous treatment at the [facility] in [place] in 2002.  Patient indicates that she has subsequently been given local anesthetics in 0626 without complications.  PROBLEM LIST: Patient Active Problem List   Diagnosis Date Noted  . Aortic atherosclerosis ([place]) 06/13/2017  . Peri-prosthetic fracture of shaft of femur 01/26/2017  . Pain from bone metastases ([place]) 01/25/2017  . Bone metastases ([place]) 01/25/2017  . Endometrial  hyperplasia 01/16/2017  . Lytic bone lesion of left femur 01/15/2017  . Hip fracture, pathological ([place]) 01/15/2017  . Lymphedema 09/17/2015  . Long term current use of anticoagulant therapy 08/23/2015  . DVT, lower extremity ([place]) 06/18/2015  . Bilateral knee pain 04/03/2015  . Arm edema 08/28/2014  . Vitamin D deficiency 04/23/2014  . Hyperparathyroidism ([place]) 04/23/2014  . Depression 07/18/2013  . Malignant neoplasm of upper-outer quadrant of right breast in female, estrogen receptor positive ([place]) 07/15/2013  . Overactive bladder 01/30/2013  . Primary hypercoagulable state ([place]) 12/19/2012  . HTN (hypertension) 09/27/2011  . Hearing loss 09/27/2011  . Seasonal allergies 09/27/2011  . Hypothyroid 08/26/2011    PMH: Past Medical History:  Diagnosis Date  . Allergy   . Anxiety   . Arthritis   . Blood transfusion without reported diagnosis   . Breast cancer ([place]) 07/12/2013   Invasive Mammary Carcinoma  . DVT (deep vein thrombosis) in pregnancy   . Hypertension   . Hypothyroid   . Personal history of radiation therapy   . Pneumonia   . Radiation 11/21/13-01/07/14   Right Breast/Supraclavicular    PSH: Past Surgical History:  Procedure Laterality Date  . BREAST LUMPECTOMY Left 2015  . BREAST LUMPECTOMY WITH RADIOACTIVE SEED LOCALIZATION Right 09/09/2013   Procedure: BREAST LUMPECTOMY WITH RADIOACTIVE SEED LOCALIZATION WITH AXILLARY NODE EXCISION;  Surgeon: [name], MD;  Location: [facility];  Service: General;  Laterality: Right;  . DENTAL SURGERY  04/19/2012   13 TEETH REMOVED  . DILATION AND CURETTAGE OF UTERUS    .  ORIF PERIPROSTHETIC FRACTURE Left 01/31/2017   Procedure: REVISION and OPEN REDUCTION INTERNAL FIXATION (ORIF) PERIPROSTHETIC FRACTURE LEFT HIP;  Surgeon: Paralee Cancel, MD;  Location: [facility];  Service: Orthopedics;  Laterality: Left;  120 mins  . RE-EXCISION OF BREAST LUMPECTOMY Right 09/24/2013   Procedure: RE-EXCISION OF RIGHT  BREAST LUMPECTOMY;  Surgeon: [name], MD;  Location: [facility];  Service: General;  Laterality: Right;  . TOTAL HIP ARTHROPLASTY Left 01/16/2017   Procedure: TOTAL HIP ARTHROPLASTY POSTERIOR;  Surgeon: Paralee Cancel, MD;  Location: [facility];  Service: Orthopedics;  Laterality: Left;    ALLERGIES: Allergies  Allergen Reactions  . Anesthetics, [place] Hypertension  . Benadryl [Diphenhydramine Hcl] Other (See Comments)    Dizziness  . Carbocaine [Mepivacaine Hcl] Hypertension  . Codeine Other (See Comments)    Dizziness  . Epinephrine Hypertension  . Sulfa Antibiotics Other (See Comments)    dizziness  . Latex Other (See Comments)    Blisters in mouth  . Tramadol     Sedation.   [place] Kitchen Penicillins Rash    Has patient had a PCN reaction causing immediate rash, facial/tongue/throat swelling, SOB or lightheadedness with hypotension: Unknown Has patient had a PCN reaction causing severe rash involving mucus membranes or skin necrosis: Unknown Has patient had a PCN reaction that required hospitalization: Unknown Has patient had a PCN reaction occurring within the last 10 years: Unknown If all of the above answers are "NO", then may proceed with Cephalosporin use.     MEDICATIONS: Current Outpatient Medications  Medication Sig Dispense Refill  . azithromycin (ZITHROMAX) 250 MG tablet 2 tabs on day 1 then 1 tab daily until complete 6 each 0  . Cholecalciferol (VITAMIN D3) 5000 UNITS TABS Take 5,000 Units by mouth daily.     . Denosumab (XGEVA [place]) Inject into the skin every 30 (thirty) days.    Marland Kitchen HYDROcodone-acetaminophen (NORCO/VICODIN) 5-325 MG tablet Take 1 tablet by mouth every 6 (six) hours as needed for moderate pain. 120 tablet 0  . levothyroxine (SYNTHROID) 125 MCG tablet Take 1 tablet (125 mcg total) by mouth daily before breakfast. Dispense as written: Synthroid LAST REFILL 90 tablet 3  . vitamin C (ASCORBIC ACID) 250 MG tablet Take 250 mg by mouth 3 (three) times  daily.    Marland Kitchen warfarin (COUMADIN) 5 MG tablet TAKE 1.5-2 TABLETS ONE TIME ONLY AT 6 PM FOR 1 DOSE. TAKE 7.5 MG ALTERNATING WITH 10 MG DAILY 90 tablet 4   No current facility-administered medications for this visit.     LABS: Lab Results  Component Value Date   WBC 7.3 06/20/2018   HGB 13.6 06/20/2018   HCT 42.2 06/20/2018   MCV 89.0 06/20/2018   PLT 234 06/20/2018      Component Value Date/Time   NA 141 03/21/2018 1417   NA 141 05/16/2017 1416   K 4.2 03/21/2018 1417   K 3.8 05/16/2017 1416   CL 105 03/21/2018 1417   CO2 25 03/21/2018 1417   CO2 27 05/16/2017 1416   GLUCOSE 105 (H) 03/21/2018 1417   GLUCOSE 140 05/16/2017 1416   BUN 15 03/21/2018 1417   BUN 13.2 05/16/2017 1416   CREATININE 0.70 05/29/2018 1021   CREATININE 0.77 03/21/2018 1417   CREATININE 0.80 09/22/2017 1555   CREATININE 0.8 05/16/2017 1416   CALCIUM 10.9 (H) 06/14/2018 1528   CALCIUM 11.0 (H) 05/16/2017 1416   GFRNONAA >60 03/21/2018 1417   GFRNONAA 75 08/19/2015 1602   GFRAA >60 03/21/2018 1417  GFRAA 87 08/19/2015 1602   Lab Results  Component Value Date   INR 2.70 06/20/2018   INR 2.73 06/13/2018   INR 2.13 05/16/2018   PROTIME 15.6 (H) 05/16/2017   PROTIME 20.4 (H) 04/18/2017   PROTIME 14.4 (H) 01/12/2017   No results found for: PTT  SOCIAL HISTORY: Social History   Socioeconomic History  . Marital status: Widowed    Spouse name: Not on file  . Number of children: 1  . Years of education: Not on file  . Highest education level: Not on file  Occupational History  . Not on file  Social Needs  . Financial resource strain: Not on file  . Food insecurity:    Worry: Not on file    Inability: Not on file  . Transportation needs:    Medical: Not on file    Non-medical: Not on file  Tobacco Use  . Smoking status: Never Smoker  . Smokeless tobacco: Never Used  Substance and Sexual Activity  . Alcohol use: No    Alcohol/week: 0.0 standard drinks  . Drug use: No  . Sexual  activity: Never  Lifestyle  . Physical activity:    Days per week: Not on file    Minutes per session: Not on file  . Stress: Not on file  Relationships  . Social connections:    Talks on phone: Not on file    Gets together: Not on file    Attends religious service: Not on file    Active member of club or organization: Not on file    Attends meetings of clubs or organizations: Not on file    Relationship status: Not on file  . Intimate partner violence:    Fear of current or ex partner: Not on file    Emotionally abused: Not on file    Physically abused: Not on file    Forced sexual activity: Not on file  Other Topics Concern  . Not on file  Social History Narrative   Exercise: yard work.    FAMILY HISTORY: Family History  Problem Relation Age of Onset  . Heart disease Brother   . Colon cancer Brother   . Prostate cancer Brother     REVIEW OF SYSTEMS: Reviewed with the patient as per History of present illness. Psych: Patient denies having dental phobia.  DENTAL HISTORY: CHIEF COMPLAINT: Patient referred by Dr. Jana Hakim for a dental consultation.  HPI: CERISE LIEBER is a 78 year old female with history of right breast cancer. The patient also has a history of bone metastases with 10 doses of Xgeva therapy administered from December of 2018 through October of 2019.  Patient is now referred for evaluation of upper left quadrant tooth pain.  Patient gives a history of upper left quadrant tooth pain and points to tooth #12 as the offending tooth.   Patient has history of intermittent, dull achy pain.  This pain is relieved with the hydrocodone pain medication that she uses.  Patient currently denies acute toothache pain today.  The patient last saw her primary dentist, Dr. Oneita Jolly, in [place], [place] in 2015 for evaluation of a broken tooth.  Patient was then referred to an oral surgeon for evaluation for extraction.  No treatment was provided, however.   Prior to that, the patient had been seen by an oral surgeon for extraction of 13 teeth in November of 2013. Patient denies having any complications from that dental treatment. The patient denies having any partial dentures.  The patient denies having dental phobia.  Patient indicates that she is allergic to local anesthetics from previous treatment at the [facility] in [place] in 2002.  Patient indicates that she has subsequently been given local anesthetics in 6063 without complications.   DENTAL EXAMINATION: GENERAL: The patient is a well-developed, obese female in no acute distress. HEAD AND NECK: There is no palpable neck lymphadenopathy. The patient denies acute TMJ symptoms. INTRAORAL EXAM: The patient has normal saliva.  There is no evidence of oral abscess formation within the mouth.  Patient has a hyperactive gag reflex making examination difficult.  Patient has a mid palatal torus. DENTITION:  Patient is missing tooth numbers 1, 2, 5, 7, 13, 15, 16, 19, 20, and 27-32. Tooth numbers 11 and 12 are present as retained root segments.  Tooth #17 is a full bony impaction.There is incisal attrition involving tooth numbers 23 through 25. PERIODONTAL:  The patient has chronic periodontitis with plaque and calculus accumulations, gingival recession, and incipient tooth mobility. There is incipient to moderate bone loss. DENTAL CARIES/SUBOPTIMAL RESTORATIONS:  Multiple dental caries are noted as per dental charting form. ENDODONTIC:  The patient has a history of intermittent acute pulpitis symptoms involving tooth numbers 11 and 12. CROWN AND BRIDGE:  Patient has PFM crowns on tooth numbers 4, 8, 9, and 10. PROSTHODONTIC:  The patient denies having partial dentures. OCCLUSION: The patient has a poor occlusal scheme secondary to multiple missing teeth, attained root segments 11 and 12, and lack of replacement of missing teeth with dental prostheses.  RADIOGRAPHIC INTERPRETATION: An  orthopantogram was taken 2 secondary to significant patient movement. Patient would not allow individual periapical radiographs secondary to severe gag reflex and lack of cooperation for the procedure. There are multiple missing teeth. There are retained root segments #11 and 12. There is an impacted #17. Multiple dental caries are noted. There is incipient to moderate bone loss noted.There are crowns on tooth numbers 4, 8, 9, and 10.  ASSESSMENTS: 1. History of right breast cancer with bone metastases 2. History of 10 doses of Xgeva therapy from December 2018 through October 2019. 3. Risk for osteonecrosis of the jaw with anticipated invasive dental procedures 4. Risk for bleeding with invasive dental procedures due to current warfarin therapy 5. Questionable need for antibiotic premedication prior to invasive dental procedures due to previous total hip replacement by [name]. 6. History of acute pulpitis 7. Multiple retained root segments 8. Dental caries 9. Chronic periodontitis with bone loss 10. Gingival recession 11. Accretions 12. Mandibular anterior incipient tooth mobility 13. Multiple missing teeth 14. Supra-eruption and drifting of the unopposed teeth into the edentulous areas 15. Impacted tooth #17 16. Poor occlusal scheme and malocclusion 7. Questionable history of allergic reaction to local anesthetics during dental treatment in 2002. However, patient has received multiple extractions in 2013 with the use of lidocaine with epinephrine along with IV sedation.  PLAN/RECOMMENDATIONS: 1. I discussed the risks, benefits, and complications of various treatment options with the patient in relationship to her medical and dental conditions, previous administration of tendinosis of Xgeva therapy, and risk for osteonecrosis of the jaw with future invasive dental procedures. We discussed various treatment options to include no treatment, multiple extractions with alveoloplasty,  pre-prosthetic surgery as indicated, periodontal therapy, dental restorations, root canal therapy, crown and bridge therapy, implant therapy, and replacement of missing teeth as indicated. We also discussed referral to an oral surgeon due to the complexity of proceeding with dental extractions  after 10 doses of Xgeva therapy and due to her current risk for bleeding complications with warfarin therapy.  The patient currently wishes to proceed with an oral surgeon for evaluation for extraction of tooth numbers 11 and 12 after thorough discussion of the risk for osteonecrosis of the jaw with invasive dental procedures.  The patient will also need to be evaluated for discontinuation of warfarin therapy for the dental extractions as well as need for antibiotic premedication prior to invasive dental procedures due to previous total hip replacement.The patient will then need to follow-up for additional comprehensive dental treatment needs and most likely will need to be referred to the [facility] or [facility] in [place], [place].   2. Discussion of findings with medical team and coordination of future medical and dental care as needed.  I spent in excess of  120 minutes during the conduct of this consultation and >50% of this time involved direct face-to-face encounter for counseling and/or coordination of the patient's care.    [name], DDS

## 2018-06-21 LAB — CANCER ANTIGEN 27.29: CAN 27.29: 41.5 U/mL — AB (ref 0.0–38.6)

## 2018-06-21 NOTE — Patient Instructions (Signed)
Patient is being referred to an oral surgeon. The oral surgeon's office will contact the patient to schedule evaluation for extraction of tooth numbers 11 and 12.  Dr. Enrique Sack

## 2018-07-11 ENCOUNTER — Inpatient Hospital Stay: Payer: Medicare Other | Attending: Oncology

## 2018-07-11 ENCOUNTER — Telehealth (HOSPITAL_COMMUNITY): Payer: Self-pay | Admitting: Dentistry

## 2018-07-11 ENCOUNTER — Inpatient Hospital Stay: Payer: Medicare Other

## 2018-07-11 ENCOUNTER — Other Ambulatory Visit: Payer: Self-pay | Admitting: Adult Health

## 2018-07-11 ENCOUNTER — Telehealth: Payer: Self-pay

## 2018-07-11 VITALS — BP 139/59 | HR 83 | Temp 97.9°F | Resp 18

## 2018-07-11 DIAGNOSIS — M84659P Pathological fracture in other disease, hip, unspecified, subsequent encounter for fracture with malunion: Secondary | ICD-10-CM

## 2018-07-11 DIAGNOSIS — G893 Neoplasm related pain (acute) (chronic): Secondary | ICD-10-CM

## 2018-07-11 DIAGNOSIS — I82401 Acute embolism and thrombosis of unspecified deep veins of right lower extremity: Secondary | ICD-10-CM

## 2018-07-11 DIAGNOSIS — Z923 Personal history of irradiation: Secondary | ICD-10-CM | POA: Diagnosis not present

## 2018-07-11 DIAGNOSIS — D6859 Other primary thrombophilia: Secondary | ICD-10-CM

## 2018-07-11 DIAGNOSIS — C50411 Malignant neoplasm of upper-outer quadrant of right female breast: Secondary | ICD-10-CM | POA: Insufficient documentation

## 2018-07-11 DIAGNOSIS — Z170 Estrogen receptor positive status [ER+]: Secondary | ICD-10-CM | POA: Diagnosis not present

## 2018-07-11 DIAGNOSIS — C7951 Secondary malignant neoplasm of bone: Secondary | ICD-10-CM | POA: Diagnosis not present

## 2018-07-11 DIAGNOSIS — M84459A Pathological fracture, hip, unspecified, initial encounter for fracture: Secondary | ICD-10-CM

## 2018-07-11 DIAGNOSIS — M8580 Other specified disorders of bone density and structure, unspecified site: Secondary | ICD-10-CM

## 2018-07-11 DIAGNOSIS — M899 Disorder of bone, unspecified: Secondary | ICD-10-CM

## 2018-07-11 DIAGNOSIS — Z79818 Long term (current) use of other agents affecting estrogen receptors and estrogen levels: Secondary | ICD-10-CM | POA: Diagnosis not present

## 2018-07-11 DIAGNOSIS — I700 Atherosclerosis of aorta: Secondary | ICD-10-CM

## 2018-07-11 LAB — PROTIME-INR
INR: 2.4
Prothrombin Time: 25.9 seconds — ABNORMAL HIGH (ref 11.4–15.2)

## 2018-07-11 LAB — CBC WITH DIFFERENTIAL/PLATELET
Abs Immature Granulocytes: 0.02 10*3/uL (ref 0.00–0.07)
Basophils Absolute: 0.1 10*3/uL (ref 0.0–0.1)
Basophils Relative: 1 %
EOS PCT: 2 %
Eosinophils Absolute: 0.2 10*3/uL (ref 0.0–0.5)
HCT: 40.6 % (ref 36.0–46.0)
Hemoglobin: 13 g/dL (ref 12.0–15.0)
IMMATURE GRANULOCYTES: 0 %
Lymphocytes Relative: 14 %
Lymphs Abs: 1.1 10*3/uL (ref 0.7–4.0)
MCH: 28.9 pg (ref 26.0–34.0)
MCHC: 32 g/dL (ref 30.0–36.0)
MCV: 90.2 fL (ref 80.0–100.0)
Monocytes Absolute: 0.8 10*3/uL (ref 0.1–1.0)
Monocytes Relative: 10 %
NRBC: 0 % (ref 0.0–0.2)
Neutro Abs: 5.9 10*3/uL (ref 1.7–7.7)
Neutrophils Relative %: 73 %
Platelets: 221 10*3/uL (ref 150–400)
RBC: 4.5 MIL/uL (ref 3.87–5.11)
RDW: 14.8 % (ref 11.5–15.5)
WBC: 8 10*3/uL (ref 4.0–10.5)

## 2018-07-11 MED ORDER — FULVESTRANT 250 MG/5ML IM SOLN
500.0000 mg | Freq: Once | INTRAMUSCULAR | Status: AC
  Administered 2018-07-11: 500 mg via INTRAMUSCULAR

## 2018-07-11 MED ORDER — FULVESTRANT 250 MG/5ML IM SOLN
INTRAMUSCULAR | Status: AC
  Filled 2018-07-11: qty 5

## 2018-07-11 MED ORDER — DENOSUMAB 120 MG/1.7ML ~~LOC~~ SOLN
SUBCUTANEOUS | Status: AC
  Filled 2018-07-11: qty 1.7

## 2018-07-11 NOTE — Patient Instructions (Signed)

## 2018-07-11 NOTE — Telephone Encounter (Signed)
07/11/2018  Patient:            Alicia Vasquez Date of Birth:  Apr 02, 1941 MRN:                [id number]   I had previously talked with Aldona Bar at Dr. Lupita Leash office (oral surgeon)oncerning inability to schedule the patient for an oral surgery consultation for evaluation for extraction of tooth numbers 11 and 12. Multiple messages had been left with the patient and her close friend concerning calling back to schedule the oral surgery consultation. I then performed a chart review and found that patient was returning to the cancer center later this afternoon for a medical appointment. I then spoke with Thompson Caul, the nurse for [facility], and requested that she provide the patient with the phone number to Dr. Lupita Leash office [phone]) to arrange for oral surgery consultation for the patient while she is undergoing her visit at the cancer center.  Thompson Caul indicated that she would assist in helping the patient schedule the oral surgery consultation during her visit-today.  [name], DDS

## 2018-07-11 NOTE — Telephone Encounter (Signed)
This nurse called to Dr. Lupita Leash office and spoke with Aldona Bar concerning setting up an appointment time for Patient to come in for an oral surgery consult.  Appointment time has been made for Monday 07/16/18 at 2 pm.  Patient has an appointment today at the cancer center.  This information will be given to her then.  Will inform patient that if appointment time does not fit her schedule she can call the dentist office to reschedule.

## 2018-07-11 NOTE — Progress Notes (Signed)
Per [name] no xgeva today.

## 2018-07-12 LAB — CANCER ANTIGEN 27.29: CA 27.29: 35.7 U/mL (ref 0.0–38.6)

## 2018-07-13 ENCOUNTER — Other Ambulatory Visit: Payer: Self-pay | Admitting: Oncology

## 2018-07-13 ENCOUNTER — Telehealth: Payer: Self-pay | Admitting: *Deleted

## 2018-07-13 DIAGNOSIS — M84559P Pathological fracture in neoplastic disease, hip, unspecified, subsequent encounter for fracture with malunion: Secondary | ICD-10-CM

## 2018-07-13 MED ORDER — HYDROCODONE-ACETAMINOPHEN 5-325 MG PO TABS: 1.0000 | ORAL_TABLET | Freq: Four times a day (QID) | ORAL | 0 refills | Status: DC | PRN

## 2018-07-13 NOTE — Telephone Encounter (Signed)
This RN returned pt's VM wanting to know INR from 2/12 lab with given number of [phone].  Obtained VM- this RN left VM stating INR within therapuetic range with no change in current coumadin dose.  This RN's name given with main clinic number for return call if needed.

## 2018-07-16 ENCOUNTER — Telehealth (HOSPITAL_COMMUNITY): Payer: Self-pay | Admitting: Dentistry

## 2018-07-16 NOTE — Telephone Encounter (Signed)
07/16/2018  Patient:            Alicia Vasquez Date of Birth:  1940-11-23 MRN:                [id number]   I received a call from [facility] with Dr. Lupita Leash office (oral surgeon). Patient called in this Monday morning to cancel the appointment for oral surgery consultation for today at 2 PM. The patient refused to reschedule the oral surgery consultation. The patient was instructed by Aldona Bar to call Dr. Lupita Leash office back to reschedule the oral surgery consultation appointment.  [name], DDS

## 2018-07-18 ENCOUNTER — Other Ambulatory Visit: Payer: Self-pay | Admitting: Oncology

## 2018-07-18 DIAGNOSIS — E039 Hypothyroidism, unspecified: Secondary | ICD-10-CM

## 2018-08-08 ENCOUNTER — Inpatient Hospital Stay: Payer: Medicare Other | Attending: Oncology

## 2018-08-08 ENCOUNTER — Encounter: Payer: Self-pay | Admitting: Adult Health

## 2018-08-08 ENCOUNTER — Telehealth: Payer: Self-pay | Admitting: Adult Health

## 2018-08-08 ENCOUNTER — Telehealth: Payer: Self-pay

## 2018-08-08 ENCOUNTER — Other Ambulatory Visit: Payer: Self-pay

## 2018-08-08 ENCOUNTER — Inpatient Hospital Stay: Payer: Medicare Other

## 2018-08-08 ENCOUNTER — Inpatient Hospital Stay (HOSPITAL_BASED_OUTPATIENT_CLINIC_OR_DEPARTMENT_OTHER): Payer: Medicare Other | Admitting: Adult Health

## 2018-08-08 VITALS — BP 142/75 | HR 83 | Temp 97.8°F | Resp 17 | Ht 64.0 in | Wt 225.1 lb

## 2018-08-08 DIAGNOSIS — C50411 Malignant neoplasm of upper-outer quadrant of right female breast: Secondary | ICD-10-CM | POA: Insufficient documentation

## 2018-08-08 DIAGNOSIS — I700 Atherosclerosis of aorta: Secondary | ICD-10-CM

## 2018-08-08 DIAGNOSIS — E039 Hypothyroidism, unspecified: Secondary | ICD-10-CM | POA: Insufficient documentation

## 2018-08-08 DIAGNOSIS — K0889 Other specified disorders of teeth and supporting structures: Secondary | ICD-10-CM | POA: Insufficient documentation

## 2018-08-08 DIAGNOSIS — Z170 Estrogen receptor positive status [ER+]: Secondary | ICD-10-CM | POA: Insufficient documentation

## 2018-08-08 DIAGNOSIS — Z86718 Personal history of other venous thrombosis and embolism: Secondary | ICD-10-CM | POA: Insufficient documentation

## 2018-08-08 DIAGNOSIS — M84659P Pathological fracture in other disease, hip, unspecified, subsequent encounter for fracture with malunion: Secondary | ICD-10-CM

## 2018-08-08 DIAGNOSIS — I10 Essential (primary) hypertension: Secondary | ICD-10-CM | POA: Diagnosis not present

## 2018-08-08 DIAGNOSIS — Z923 Personal history of irradiation: Secondary | ICD-10-CM

## 2018-08-08 DIAGNOSIS — M8580 Other specified disorders of bone density and structure, unspecified site: Secondary | ICD-10-CM

## 2018-08-08 DIAGNOSIS — Z7901 Long term (current) use of anticoagulants: Secondary | ICD-10-CM

## 2018-08-08 DIAGNOSIS — Z79899 Other long term (current) drug therapy: Secondary | ICD-10-CM

## 2018-08-08 DIAGNOSIS — M899 Disorder of bone, unspecified: Secondary | ICD-10-CM

## 2018-08-08 DIAGNOSIS — C7951 Secondary malignant neoplasm of bone: Secondary | ICD-10-CM

## 2018-08-08 DIAGNOSIS — F419 Anxiety disorder, unspecified: Secondary | ICD-10-CM

## 2018-08-08 DIAGNOSIS — I82401 Acute embolism and thrombosis of unspecified deep veins of right lower extremity: Secondary | ICD-10-CM

## 2018-08-08 DIAGNOSIS — G893 Neoplasm related pain (acute) (chronic): Secondary | ICD-10-CM

## 2018-08-08 DIAGNOSIS — D6859 Other primary thrombophilia: Secondary | ICD-10-CM

## 2018-08-08 DIAGNOSIS — M84459A Pathological fracture, hip, unspecified, initial encounter for fracture: Secondary | ICD-10-CM

## 2018-08-08 LAB — CBC WITH DIFFERENTIAL/PLATELET
Abs Immature Granulocytes: 0.01 10*3/uL (ref 0.00–0.07)
Basophils Absolute: 0.1 10*3/uL (ref 0.0–0.1)
Basophils Relative: 1 %
Eosinophils Absolute: 0.2 10*3/uL (ref 0.0–0.5)
Eosinophils Relative: 4 %
HCT: 41.5 % (ref 36.0–46.0)
Hemoglobin: 13.1 g/dL (ref 12.0–15.0)
IMMATURE GRANULOCYTES: 0 %
Lymphocytes Relative: 16 %
Lymphs Abs: 1 10*3/uL (ref 0.7–4.0)
MCH: 28.7 pg (ref 26.0–34.0)
MCHC: 31.6 g/dL (ref 30.0–36.0)
MCV: 91 fL (ref 80.0–100.0)
Monocytes Absolute: 0.9 10*3/uL (ref 0.1–1.0)
Monocytes Relative: 14 %
NRBC: 0 % (ref 0.0–0.2)
Neutro Abs: 4.1 10*3/uL (ref 1.7–7.7)
Neutrophils Relative %: 65 %
PLATELETS: 222 10*3/uL (ref 150–400)
RBC: 4.56 MIL/uL (ref 3.87–5.11)
RDW: 14.8 % (ref 11.5–15.5)
WBC: 6.3 10*3/uL (ref 4.0–10.5)

## 2018-08-08 LAB — PROTIME-INR
INR: 2.6 — ABNORMAL HIGH (ref 0.8–1.2)
Prothrombin Time: 27.8 seconds — ABNORMAL HIGH (ref 11.4–15.2)

## 2018-08-08 MED ORDER — FULVESTRANT 250 MG/5ML IM SOLN
500.0000 mg | Freq: Once | INTRAMUSCULAR | Status: AC
  Administered 2018-08-08: 500 mg via INTRAMUSCULAR

## 2018-08-08 MED ORDER — FULVESTRANT 250 MG/5ML IM SOLN
INTRAMUSCULAR | Status: AC
  Filled 2018-08-08: qty 10

## 2018-08-08 NOTE — Telephone Encounter (Signed)
Scheduled appt per 3/11 los.  Printed calendar and avs,

## 2018-08-08 NOTE — Telephone Encounter (Signed)
LVM for patient informing that we were able to schedule a dental consultation with Dr. Benson Norway.  Appointment is tomorrow 2:15 pm.  Dental office number [phone] left on VM if she needs to reschedule.  Also, if  xray is needed at that time the cost is $169 unless she has dental insurance.  Center call back number [email or web address] if she has questions.

## 2018-08-08 NOTE — Patient Instructions (Signed)

## 2018-08-08 NOTE — Progress Notes (Signed)
[place]  [phone] [phone]     ID: Alicia Vasquez OB: 08-01-1940  MR#: [id number]  YOV#:[id number]  PCP: Briscoe Deutscher, DO GYN:   SU: [name] OTHER MD: Thea Silversmith, Hart Robinsons, Philemon Kingdom  CHIEF COMPLAINT: Estrogen receptor positive breast cancer  CURRENT TREATMENT:  Fulvestrant and denosumab/Xgeva   INTERVAL HISTORY: Alicia Vasquez is here today for follow up and treatment of her metastatic estrogen receptor positive breast cancer accompanied by her friend and caregiver.   Alicia Vasquez receives fulvestrant every 4 weeks, and is tolerating it well.    She is also taking Coumadin as directed.   Alicia Vasquez has not received Xgeva since 03/21/2018.  In January of 2020 she noted a molar that had broken off and dental pain associated with this.  She was evaluated by Dr. Enrique Sack on 06/20/2018 and has been referred to an oral surgeon for extraction of two teeth (numbers 11 and 12). She however canceled her appointment with an oral surgeon--Dr. Hoyt Koch and had decided not to reschedule.  She was instructed to reschedule this appointment by Dr. [name] office staff.    Alicia Vasquez explains that she is fearful to have the procedure done, because she is concerned about the delayed healing issue that may occur.  She also would rather first have consultation with Dr. Benson Norway first since he knows her and has has already built rapport with her.  They are unsure about insurance coverage/cost as well which has perhaps interfered with her willingness to have the appointment.  REVIEW OF SYSTEMS: Alicia Vasquez is having continued pain in her left hip. She is taking Norco for this pain and takes it every 6 hours.  Her pain is controlled with this, she is able to be more active with it, and denies constipation.  She denies any new issues such as nausea, vomiting, bowel/bladder changes.  She is without any new pain, chest pain, palpitations, cough, or shortness of breath.  A  detailed ROS was otherwise non contributory.  BREAST CANCER HISTORY: From doctor [name]'s intake node 07/24/2013:  "78 y.o. female. Who presented with SOB and had a CT chest performed that revealed a right breast mass. Mammogram/ultrsound on 2/13 showed a mass in the 11 o'clock position in the right breast measuring 1.5 cm. Also noted was a right axillary LN measuring 1.6 cm. MRI not performed. Biopsy of mass and lymph done. 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 Lymph node + for metastatic carcinoma."  [On 09/09/2013 the patient underwent right lumpectomy and sentinel lymph node sampling. This showed (SZA [phone]) multifocal invasive ductal carcinoma, grade 1, the largest lesion measuring 1.8 cm, the second lesion 1.2 cm. One of 4 sentinel lymph nodes was positive, with extracapsular extension. Margins were positive. HER-2 was repeated and was again negative. Further surgery 09/16/2013 obtained clear margins.  Her subsequent history is as detailed below  PAST MEDICAL HISTORY: Past Medical History:  Diagnosis Date  . Allergy   . Anxiety   . Arthritis   . Blood transfusion without reported diagnosis   . Breast cancer ([place]) 07/12/2013   Invasive Mammary Carcinoma  . DVT (deep vein thrombosis) in pregnancy   . Hypertension   . Hypothyroid   . Personal history of radiation therapy   . Pneumonia   . Radiation 11/21/13-01/07/14   Right Breast/Supraclavicular    PAST SURGICAL HISTORY: Past Surgical History:  Procedure Laterality Date  .  BREAST LUMPECTOMY Left 2015  . BREAST LUMPECTOMY WITH RADIOACTIVE SEED LOCALIZATION Right 09/09/2013   Procedure: BREAST LUMPECTOMY WITH RADIOACTIVE SEED LOCALIZATION WITH AXILLARY NODE EXCISION;  Surgeon: [name], MD;  Location: [facility];  Service: General;  Laterality: Right;  . DENTAL SURGERY  04/19/2012   13  TEETH REMOVED  . DILATION AND CURETTAGE OF UTERUS    . ORIF PERIPROSTHETIC FRACTURE Left 01/31/2017   Procedure: REVISION and OPEN REDUCTION INTERNAL FIXATION (ORIF) PERIPROSTHETIC FRACTURE LEFT HIP;  Surgeon: Paralee Cancel, MD;  Location: [facility];  Service: Orthopedics;  Laterality: Left;  120 mins  . RE-EXCISION OF BREAST LUMPECTOMY Right 09/24/2013   Procedure: RE-EXCISION OF RIGHT BREAST LUMPECTOMY;  Surgeon: [name], MD;  Location: [facility];  Service: General;  Laterality: Right;  . TOTAL HIP ARTHROPLASTY Left 01/16/2017   Procedure: TOTAL HIP ARTHROPLASTY POSTERIOR;  Surgeon: Paralee Cancel, MD;  Location: [facility];  Service: Orthopedics;  Laterality: Left;    FAMILY HISTORY Family History  Problem Relation Age of Onset  . Heart disease Brother   . Colon cancer Brother   . Prostate cancer Brother    the patient's father died at the age of 69 after an automobile accident. The patient's mother died at the age of 52. She was a Marine scientist here in [name] in the old [facility]. She was infected with polio and was confined to a wheelchair for a good part of her life. She eventually died of pneumonia. The patient had one brother, who died with prostate cancer. She had no sisters. There is no history of breast or ovarian cancer in the family.  GYNECOLOGIC HISTORY:  Menarche age 46, first live birth age 25, the patient is GX P1. She went through the change of life at age 7. 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 She is widowed. Currently she is staying with her friend Alicia Vasquez,  50,who is a retired Radio producer. The patient's son Alicia Vasquez lives in [place]. He works an Engineer, technical sales. The patient has no grandchildren. She is a Tourist information centre manager but currently attends a [employer] with her friend Alicia Vasquez   ADVANCED DIRECTIVES: Not in place   HEALTH MAINTENANCE: Social History   Tobacco Use  .  Smoking status: Never Smoker  . Smokeless tobacco: Never Used  Substance Use Topics  . Alcohol use: No    Alcohol/week: 0.0 standard drinks  . Drug use: No     Colonoscopy: Never  PAP:  Bone density: 09/20/2016 showed a T score of -2.2  Lipid panel:  Allergies  Allergen Reactions  . Anesthetics, Amide Hypertension  . Benadryl [Diphenhydramine Hcl] Other (See Comments)    Dizziness  . Carbocaine [Mepivacaine Hcl] Hypertension  . Codeine Other (See Comments)    Dizziness  . Epinephrine Hypertension  . Sulfa Antibiotics Other (See Comments)    dizziness  . Latex Other (See Comments)    Blisters in mouth  . Tramadol     Sedation.   Marland Kitchen Penicillins Rash    Has patient had a PCN reaction causing immediate rash, facial/tongue/throat swelling, SOB or lightheadedness with hypotension: Unknown Has patient had a PCN reaction causing severe rash involving mucus membranes or skin necrosis: Unknown Has patient had a PCN reaction that required hospitalization: Unknown Has patient had a PCN reaction occurring within the last 10 years: Unknown If all of the above answers are "NO", then may proceed with  Cephalosporin use.     Current Outpatient Medications  Medication Sig Dispense Refill  . Cholecalciferol (VITAMIN D3) 5000 UNITS TABS Take 5,000 Units by mouth daily.     . Denosumab (XGEVA ) Inject into the skin every 30 (thirty) days.    Marland Kitchen HYDROcodone-acetaminophen (NORCO/VICODIN) 5-325 MG tablet Take 1 tablet by mouth every 6 (six) hours as needed for moderate pain. 120 tablet 0  . SYNTHROID 125 MCG tablet TAKE 1 TABLET BY MOUTH EVERY DAY BEFORE BREAKFAST 90 tablet 0  . vitamin C (ASCORBIC ACID) 250 MG tablet Take 250 mg by mouth 3 (three) times daily.    Marland Kitchen warfarin (COUMADIN) 5 MG tablet TAKE 1.5-2 TABLETS ONE TIME ONLY AT 6 PM FOR 1 DOSE. TAKE 7.5 MG ALTERNATING WITH 10 MG DAILY 90 tablet 4   No current facility-administered medications for this visit.     OBJECTIVE:  Vitals:    08/08/18 1157  BP: (!) 142/75  Pulse: 83  Resp: 17  Temp: 97.8 F (36.6 C)  SpO2: 97%     Body mass index is 38.64 kg/m.    ECOG FS: 2 - Symptomatic, <50% confined to bed GENERAL: Patient is a chronically ill appearing woman in wheelchair, accompanied by her friend and caregiver HEENT:  Sclerae anicteric.  Oropharynx clear and moist. No ulcerations or evidence of oropharyngeal candidiasis. Neck is supple.  NODES:  No cervical, supraclavicular, or axillary lymphadenopathy palpated.  BREAST EXAM:  Deferred today LUNGS:  Clear to auscultation bilaterally.  No wheezes or rhonchi. HEART:  Regular rate and rhythm. No murmur appreciated. ABDOMEN:  Soft, nontender.  Positive, normoactive bowel sounds. No organomegaly palpated. MSK:  No focal spinal tenderness to palpation. EXTREMITIES:  No peripheral edema.   SKIN:  Clear with no obvious rashes or skin changes. No nail dyscrasia. NEURO:  Nonfocal. Well oriented.  Appropriate affect.    LAB RESULTS:  CMP     Component Value Date/Time   NA 141 03/21/2018 1417   NA 141 05/16/2017 1416   K 4.2 03/21/2018 1417   K 3.8 05/16/2017 1416   CL 105 03/21/2018 1417   CO2 25 03/21/2018 1417   CO2 27 05/16/2017 1416   GLUCOSE 105 (H) 03/21/2018 1417   GLUCOSE 140 05/16/2017 1416   BUN 15 03/21/2018 1417   BUN 13.2 05/16/2017 1416   CREATININE 0.70 05/29/2018 1021   CREATININE 0.77 03/21/2018 1417   CREATININE 0.80 09/22/2017 1555   CREATININE 0.8 05/16/2017 1416   CALCIUM 10.9 (H) 06/14/2018 1528   CALCIUM 11.0 (H) 05/16/2017 1416   PROT 6.7 03/21/2018 1417   PROT 6.9 05/16/2017 1416   ALBUMIN 3.8 03/21/2018 1417   ALBUMIN 3.6 05/16/2017 1416   AST 14 (L) 03/21/2018 1417   AST 13 05/16/2017 1416   ALT 14 03/21/2018 1417   ALT <6 05/16/2017 1416   ALKPHOS 83 03/21/2018 1417   ALKPHOS 130 05/16/2017 1416   BILITOT 0.3 03/21/2018 1417   BILITOT 0.31 05/16/2017 1416   GFRNONAA >60 03/21/2018 1417   GFRNONAA 75 08/19/2015 1602   GFRAA  >60 03/21/2018 1417   GFRAA 87 08/19/2015 1602    I No results found for: SPEP  Lab Results  Component Value Date   WBC 6.3 08/08/2018   NEUTROABS 4.1 08/08/2018   HGB 13.1 08/08/2018   HCT 41.5 08/08/2018   MCV 91.0 08/08/2018   PLT 222 08/08/2018      Chemistry      Component Value Date/Time   NA 141  03/21/2018 1417   NA 141 05/16/2017 1416   K 4.2 03/21/2018 1417   K 3.8 05/16/2017 1416   CL 105 03/21/2018 1417   CO2 25 03/21/2018 1417   CO2 27 05/16/2017 1416   BUN 15 03/21/2018 1417   BUN 13.2 05/16/2017 1416   CREATININE 0.70 05/29/2018 1021   CREATININE 0.77 03/21/2018 1417   CREATININE 0.80 09/22/2017 1555   CREATININE 0.8 05/16/2017 1416      Component Value Date/Time   CALCIUM 10.9 (H) 06/14/2018 1528   CALCIUM 11.0 (H) 05/16/2017 1416   ALKPHOS 83 03/21/2018 1417   ALKPHOS 130 05/16/2017 1416   AST 14 (L) 03/21/2018 1417   AST 13 05/16/2017 1416   ALT 14 03/21/2018 1417   ALT <6 05/16/2017 1416   BILITOT 0.3 03/21/2018 1417   BILITOT 0.31 05/16/2017 1416       No results found for: LABCA2  No components found for: LABCA125  Recent Labs  Lab 08/08/18 1132  INR 2.6*    Urinalysis    Component Value Date/Time   COLORURINE STRAW (A) 02/20/2018 [place] 02/20/2018 1606   LABSPEC 1.009 02/20/2018 1606   LABSPEC 1.010 09/22/2016 1553   PHURINE 6.0 02/20/2018 1606   GLUCOSEU NEGATIVE 02/20/2018 1606   GLUCOSEU Negative 09/22/2016 1553   HGBUR NEGATIVE 02/20/2018 1606   BILIRUBINUR Negative 03/16/2018 1433   BILIRUBINUR Negative 09/22/2016 1553   KETONESUR NEGATIVE 02/20/2018 1606   PROTEINUR Negative 03/16/2018 1433   PROTEINUR NEGATIVE 02/20/2018 1606   UROBILINOGEN 0.2 03/16/2018 1433   UROBILINOGEN 0.2 11/15/2016 1358   UROBILINOGEN 0.2 09/22/2016 1553   NITRITE Negative 03/16/2018 1433   NITRITE NEGATIVE 02/20/2018 1606   LEUKOCYTESUR Negative 03/16/2018 1433   LEUKOCYTESUR Negative 09/22/2016 1553     STUDIES: No results found.  ASSESSMENT: 78 y.o. [place] woman status post right breast upper outer quadrant lumpectomy and sentinel lymph node sampling 09/09/2013 for an mpT1c pN1a, stage IIA invasive ductal carcinoma, estrogen and progesterone receptor both 100% positive with strong staining intensity, MIB-1 of 17% and no HER-2 amplification  (1) additional surgery for margin clearance 09/16/2013 obtained negative margins  (2) Oncotype DX recurrence score of 4 predicts a risk of outside the breast recurrence within 10 years of 7% if the patient's only systemic therapy is tamoxifen for 5 years. It also predicts no benefit from chemotherapy  (3) adjuvant radiation completed 01/07/2014  (4) anastrozole started 02/27/2014 stopped within 2 weeks because of arm swelling.   (a) bone density April 2016 showed osteopenia, with a t-score of -1.6  (b) anastrozole resumed 12/17/2015  (c) Bone density 09/20/2016 showed a T score of -2.2  (5) history of left lower extremity DVT 11/23/2012, initially on rivaroxaban, which caused chest pain, switch to Coumadin July 2014  (b) coumadin dose increased to 7.5/10 mg alternating days as of 06/13/2017   METASTATIC DISEASE: August 2018 (6) status post left total hip replacement 01/16/2017 for estrogen receptor positive adenocarcinoma.  (a) CA 27-29 was 46.4 as of 04/18/2017  (b) chest CT scan 05/02/2017 shows no lung or liver lesion concern; it does show aortic atherosclerosis  (c) baseline bone scan 05/02/2017 was negative  (d) PET scan 09/12/2017 shows no active disease, including bone  (e) PET scan and CT chest on 05/29/2018 show no active disease  (7) fulvestrant started 04/18/2017  (8) denosumab/Xgeva started 05/16/2017  (a) changed to every 12-weeks after 10/03/2017 dose   (b) held starting with 06/13/2018 dose due to dental concern  PLAN:  Alicia Vasquez is doing well today.  She has no clinical sign of progression today.  Her pain is well controlled  with her current norco dose.  She doesn't need refills today.   I reviewed her labs today which are normal.  Her INR is at an acceptable range.  She will continue to receive Fulvestrant every 4 weeks.  We will continue to hold the Xgeva until we get some sort of answer about her dental issues.  Alicia Vasquez and I reviewed the dental concern.  My nurse Cecille Rubin called and got her an appointment with Dr. Gwenlyn Saran.  She will also get her xrays sent to him.  It will be up to him as to how to deal with her current issue, and may need another referral.    Latorie will return every 4 weeks for labs and an injection.  She will see Dr. Jana Hakim in 12 weeks.  I have ordered a CT chest to be done prior to her next appointment with him.  She knows to call for any questions or concerns prior to her next appointment with [name].    A total of (30) minutes of face-to-face time was spent with this patient with greater than 50% of that time in counseling and care-coordination.  Wilber Bihari, NP  08/08/18 12:12 PM Medical Oncology and Hematology [facility] [address] [place], [place] [address] Tel. [phone]    Fax. [phone]

## 2018-08-09 LAB — CANCER ANTIGEN 27.29: CA 27.29: 38.5 U/mL (ref 0.0–38.6)

## 2018-08-13 ENCOUNTER — Other Ambulatory Visit: Payer: Self-pay | Admitting: Oncology

## 2018-08-13 ENCOUNTER — Other Ambulatory Visit: Payer: Self-pay | Admitting: *Deleted

## 2018-08-13 DIAGNOSIS — M84559P Pathological fracture in neoplastic disease, hip, unspecified, subsequent encounter for fracture with malunion: Secondary | ICD-10-CM

## 2018-08-13 MED ORDER — HYDROCODONE-ACETAMINOPHEN 5-325 MG PO TABS: 1.0000 | ORAL_TABLET | Freq: Four times a day (QID) | ORAL | 0 refills | Status: DC | PRN

## 2018-09-05 ENCOUNTER — Other Ambulatory Visit: Payer: Self-pay

## 2018-09-05 ENCOUNTER — Inpatient Hospital Stay: Payer: Medicare Other

## 2018-09-05 ENCOUNTER — Inpatient Hospital Stay: Payer: Medicare Other | Attending: Oncology

## 2018-09-05 DIAGNOSIS — Z86718 Personal history of other venous thrombosis and embolism: Secondary | ICD-10-CM | POA: Insufficient documentation

## 2018-09-05 DIAGNOSIS — Z170 Estrogen receptor positive status [ER+]: Secondary | ICD-10-CM

## 2018-09-05 DIAGNOSIS — M84659P Pathological fracture in other disease, hip, unspecified, subsequent encounter for fracture with malunion: Secondary | ICD-10-CM

## 2018-09-05 DIAGNOSIS — C7951 Secondary malignant neoplasm of bone: Secondary | ICD-10-CM

## 2018-09-05 DIAGNOSIS — R978 Other abnormal tumor markers: Secondary | ICD-10-CM | POA: Insufficient documentation

## 2018-09-05 DIAGNOSIS — C50411 Malignant neoplasm of upper-outer quadrant of right female breast: Secondary | ICD-10-CM | POA: Diagnosis not present

## 2018-09-05 DIAGNOSIS — M84459A Pathological fracture, hip, unspecified, initial encounter for fracture: Secondary | ICD-10-CM

## 2018-09-05 DIAGNOSIS — M8580 Other specified disorders of bone density and structure, unspecified site: Secondary | ICD-10-CM

## 2018-09-05 DIAGNOSIS — Z7901 Long term (current) use of anticoagulants: Secondary | ICD-10-CM | POA: Insufficient documentation

## 2018-09-05 DIAGNOSIS — I700 Atherosclerosis of aorta: Secondary | ICD-10-CM

## 2018-09-05 DIAGNOSIS — D6859 Other primary thrombophilia: Secondary | ICD-10-CM

## 2018-09-05 DIAGNOSIS — M899 Disorder of bone, unspecified: Secondary | ICD-10-CM

## 2018-09-05 DIAGNOSIS — G893 Neoplasm related pain (acute) (chronic): Secondary | ICD-10-CM

## 2018-09-05 DIAGNOSIS — I82401 Acute embolism and thrombosis of unspecified deep veins of right lower extremity: Secondary | ICD-10-CM

## 2018-09-05 LAB — CBC WITH DIFFERENTIAL/PLATELET
Abs Immature Granulocytes: 0.02 10*3/uL (ref 0.00–0.07)
Basophils Absolute: 0.1 10*3/uL (ref 0.0–0.1)
Basophils Relative: 1 %
Eosinophils Absolute: 0.2 10*3/uL (ref 0.0–0.5)
Eosinophils Relative: 3 %
HCT: 41 % (ref 36.0–46.0)
Hemoglobin: 12.9 g/dL (ref 12.0–15.0)
Immature Granulocytes: 0 %
Lymphocytes Relative: 15 %
Lymphs Abs: 1.1 10*3/uL (ref 0.7–4.0)
MCH: 28.2 pg (ref 26.0–34.0)
MCHC: 31.5 g/dL (ref 30.0–36.0)
MCV: 89.5 fL (ref 80.0–100.0)
Monocytes Absolute: 1.2 10*3/uL — ABNORMAL HIGH (ref 0.1–1.0)
Monocytes Relative: 15 %
Neutro Abs: 4.9 10*3/uL (ref 1.7–7.7)
Neutrophils Relative %: 66 %
Platelets: 209 10*3/uL (ref 150–400)
RBC: 4.58 MIL/uL (ref 3.87–5.11)
RDW: 15 % (ref 11.5–15.5)
WBC: 7.5 10*3/uL (ref 4.0–10.5)
nRBC: 0 % (ref 0.0–0.2)

## 2018-09-05 LAB — PROTIME-INR
INR: 2.8 — ABNORMAL HIGH (ref 0.8–1.2)
Prothrombin Time: 29 seconds — ABNORMAL HIGH (ref 11.4–15.2)

## 2018-09-05 MED ORDER — FULVESTRANT 250 MG/5ML IM SOLN
500.0000 mg | Freq: Once | INTRAMUSCULAR | Status: AC
  Administered 2018-09-05: 500 mg via INTRAMUSCULAR

## 2018-09-05 MED ORDER — FULVESTRANT 250 MG/5ML IM SOLN
INTRAMUSCULAR | Status: AC
  Filled 2018-09-05: qty 10

## 2018-09-05 NOTE — Patient Instructions (Signed)

## 2018-09-06 ENCOUNTER — Other Ambulatory Visit: Payer: Self-pay | Admitting: Oncology

## 2018-09-06 DIAGNOSIS — E039 Hypothyroidism, unspecified: Secondary | ICD-10-CM

## 2018-09-06 LAB — CANCER ANTIGEN 27.29: CA 27.29: 37.9 U/mL (ref 0.0–38.6)

## 2018-09-08 ENCOUNTER — Other Ambulatory Visit: Payer: Self-pay | Admitting: Oncology

## 2018-09-08 DIAGNOSIS — Z86711 Personal history of pulmonary embolism: Secondary | ICD-10-CM

## 2018-09-18 ENCOUNTER — Other Ambulatory Visit: Payer: Self-pay | Admitting: Oncology

## 2018-09-18 DIAGNOSIS — M84559P Pathological fracture in neoplastic disease, hip, unspecified, subsequent encounter for fracture with malunion: Secondary | ICD-10-CM

## 2018-09-18 MED ORDER — HYDROCODONE-ACETAMINOPHEN 5-325 MG PO TABS: 1.0000 | ORAL_TABLET | Freq: Four times a day (QID) | ORAL | 0 refills | Status: DC | PRN

## 2018-10-03 ENCOUNTER — Telehealth: Payer: Self-pay

## 2018-10-03 ENCOUNTER — Inpatient Hospital Stay: Payer: Medicare Other | Attending: Oncology

## 2018-10-03 ENCOUNTER — Other Ambulatory Visit: Payer: Self-pay

## 2018-10-03 ENCOUNTER — Inpatient Hospital Stay: Payer: Medicare Other

## 2018-10-03 VITALS — BP 135/74 | HR 79 | Temp 98.2°F | Resp 18

## 2018-10-03 DIAGNOSIS — Z923 Personal history of irradiation: Secondary | ICD-10-CM | POA: Diagnosis not present

## 2018-10-03 DIAGNOSIS — Z170 Estrogen receptor positive status [ER+]: Secondary | ICD-10-CM

## 2018-10-03 DIAGNOSIS — Z79899 Other long term (current) drug therapy: Secondary | ICD-10-CM | POA: Diagnosis not present

## 2018-10-03 DIAGNOSIS — F419 Anxiety disorder, unspecified: Secondary | ICD-10-CM | POA: Insufficient documentation

## 2018-10-03 DIAGNOSIS — M8580 Other specified disorders of bone density and structure, unspecified site: Secondary | ICD-10-CM | POA: Diagnosis not present

## 2018-10-03 DIAGNOSIS — Z7901 Long term (current) use of anticoagulants: Secondary | ICD-10-CM | POA: Insufficient documentation

## 2018-10-03 DIAGNOSIS — E039 Hypothyroidism, unspecified: Secondary | ICD-10-CM | POA: Diagnosis not present

## 2018-10-03 DIAGNOSIS — Z86718 Personal history of other venous thrombosis and embolism: Secondary | ICD-10-CM | POA: Diagnosis not present

## 2018-10-03 DIAGNOSIS — M899 Disorder of bone, unspecified: Secondary | ICD-10-CM

## 2018-10-03 DIAGNOSIS — I10 Essential (primary) hypertension: Secondary | ICD-10-CM | POA: Diagnosis not present

## 2018-10-03 DIAGNOSIS — C50411 Malignant neoplasm of upper-outer quadrant of right female breast: Secondary | ICD-10-CM | POA: Diagnosis not present

## 2018-10-03 DIAGNOSIS — D6859 Other primary thrombophilia: Secondary | ICD-10-CM

## 2018-10-03 DIAGNOSIS — I700 Atherosclerosis of aorta: Secondary | ICD-10-CM

## 2018-10-03 DIAGNOSIS — I82401 Acute embolism and thrombosis of unspecified deep veins of right lower extremity: Secondary | ICD-10-CM

## 2018-10-03 DIAGNOSIS — K0889 Other specified disorders of teeth and supporting structures: Secondary | ICD-10-CM | POA: Insufficient documentation

## 2018-10-03 DIAGNOSIS — C7951 Secondary malignant neoplasm of bone: Secondary | ICD-10-CM

## 2018-10-03 DIAGNOSIS — M84659P Pathological fracture in other disease, hip, unspecified, subsequent encounter for fracture with malunion: Secondary | ICD-10-CM

## 2018-10-03 DIAGNOSIS — M84459A Pathological fracture, hip, unspecified, initial encounter for fracture: Secondary | ICD-10-CM

## 2018-10-03 DIAGNOSIS — G893 Neoplasm related pain (acute) (chronic): Secondary | ICD-10-CM

## 2018-10-03 LAB — COMPREHENSIVE METABOLIC PANEL
ALT: 15 U/L (ref 0–44)
AST: 12 U/L — ABNORMAL LOW (ref 15–41)
Albumin: 3.6 g/dL (ref 3.5–5.0)
Alkaline Phosphatase: 88 U/L (ref 38–126)
Anion gap: 8 (ref 5–15)
BUN: 14 mg/dL (ref 8–23)
CO2: 26 mmol/L (ref 22–32)
Calcium: 10.3 mg/dL (ref 8.9–10.3)
Chloride: 107 mmol/L (ref 98–111)
Creatinine, Ser: 0.77 mg/dL (ref 0.44–1.00)
GFR calc Af Amer: >60 mL/min (ref >60)
GFR calc non Af Amer: >60 mL/min (ref >60)
Glucose, Bld: 111 mg/dL — ABNORMAL HIGH (ref 70–99)
Potassium: 4.4 mmol/L (ref 3.5–5.1)
Sodium: 141 mmol/L (ref 135–145)
Total Bilirubin: 0.3 mg/dL (ref 0.3–1.2)
Total Protein: 6.7 g/dL (ref 6.5–8.1)

## 2018-10-03 LAB — CBC WITH DIFFERENTIAL/PLATELET
Abs Immature Granulocytes: 0.03 10*3/uL (ref 0.00–0.07)
Basophils Absolute: 0.1 10*3/uL (ref 0.0–0.1)
Basophils Relative: 1 %
Eosinophils Absolute: 0.3 10*3/uL (ref 0.0–0.5)
Eosinophils Relative: 5 %
HCT: 41.1 % (ref 36.0–46.0)
Hemoglobin: 13.1 g/dL (ref 12.0–15.0)
Immature Granulocytes: 1 %
Lymphocytes Relative: 16 %
Lymphs Abs: 1 10*3/uL (ref 0.7–4.0)
MCH: 28.5 pg (ref 26.0–34.0)
MCHC: 31.9 g/dL (ref 30.0–36.0)
MCV: 89.5 fL (ref 80.0–100.0)
Monocytes Absolute: 1 10*3/uL (ref 0.1–1.0)
Monocytes Relative: 16 %
Neutro Abs: 4 10*3/uL (ref 1.7–7.7)
Neutrophils Relative %: 61 %
Platelets: 215 10*3/uL (ref 150–400)
RBC: 4.59 MIL/uL (ref 3.87–5.11)
RDW: 14.9 % (ref 11.5–15.5)
WBC: 6.5 10*3/uL (ref 4.0–10.5)
nRBC: 0 % (ref 0.0–0.2)

## 2018-10-03 LAB — PROTIME-INR
INR: 1.6 — ABNORMAL HIGH (ref 0.8–1.2)
Prothrombin Time: 18.8 seconds — ABNORMAL HIGH (ref 11.4–15.2)

## 2018-10-03 MED ORDER — FULVESTRANT 250 MG/5ML IM SOLN
500.0000 mg | Freq: Once | INTRAMUSCULAR | Status: AC
  Administered 2018-10-03: 500 mg via INTRAMUSCULAR

## 2018-10-03 MED ORDER — FULVESTRANT 250 MG/5ML IM SOLN
INTRAMUSCULAR | Status: AC
  Filled 2018-10-03: qty 5

## 2018-10-03 NOTE — Telephone Encounter (Signed)
Per Dr. Jana Hakim patient to continue on current Coumadin dosage, recheck PT/INR 1 week.   Nurse notified patient, patient voiced understanding.  Patient verbalized she did forget to take medications a few days.  Nurse educated pt on importance on taking as instructed.  No further needs at this time.  Lab appointment made.

## 2018-10-03 NOTE — Patient Instructions (Signed)

## 2018-10-04 LAB — CANCER ANTIGEN 27.29: CA 27.29: 34.1 U/mL (ref 0.0–38.6)

## 2018-10-11 ENCOUNTER — Inpatient Hospital Stay: Payer: Medicare Other

## 2018-10-11 ENCOUNTER — Other Ambulatory Visit: Payer: Self-pay

## 2018-10-11 DIAGNOSIS — I700 Atherosclerosis of aorta: Secondary | ICD-10-CM

## 2018-10-11 DIAGNOSIS — Z923 Personal history of irradiation: Secondary | ICD-10-CM | POA: Diagnosis not present

## 2018-10-11 DIAGNOSIS — I82401 Acute embolism and thrombosis of unspecified deep veins of right lower extremity: Secondary | ICD-10-CM

## 2018-10-11 DIAGNOSIS — C7951 Secondary malignant neoplasm of bone: Secondary | ICD-10-CM

## 2018-10-11 DIAGNOSIS — Z86718 Personal history of other venous thrombosis and embolism: Secondary | ICD-10-CM | POA: Diagnosis not present

## 2018-10-11 DIAGNOSIS — C50411 Malignant neoplasm of upper-outer quadrant of right female breast: Secondary | ICD-10-CM | POA: Diagnosis not present

## 2018-10-11 DIAGNOSIS — G893 Neoplasm related pain (acute) (chronic): Secondary | ICD-10-CM

## 2018-10-11 DIAGNOSIS — Z170 Estrogen receptor positive status [ER+]: Secondary | ICD-10-CM | POA: Diagnosis not present

## 2018-10-11 DIAGNOSIS — M8580 Other specified disorders of bone density and structure, unspecified site: Secondary | ICD-10-CM | POA: Diagnosis not present

## 2018-10-11 DIAGNOSIS — M84459A Pathological fracture, hip, unspecified, initial encounter for fracture: Secondary | ICD-10-CM

## 2018-10-11 DIAGNOSIS — Z7901 Long term (current) use of anticoagulants: Secondary | ICD-10-CM | POA: Diagnosis not present

## 2018-10-11 DIAGNOSIS — D6859 Other primary thrombophilia: Secondary | ICD-10-CM

## 2018-10-11 LAB — CBC WITH DIFFERENTIAL/PLATELET
Abs Immature Granulocytes: 0.02 10*3/uL (ref 0.00–0.07)
Basophils Absolute: 0.1 10*3/uL (ref 0.0–0.1)
Basophils Relative: 1 %
Eosinophils Absolute: 0.5 10*3/uL (ref 0.0–0.5)
Eosinophils Relative: 7 %
HCT: 40.3 % (ref 36.0–46.0)
Hemoglobin: 12.8 g/dL (ref 12.0–15.0)
Immature Granulocytes: 0 %
Lymphocytes Relative: 14 %
Lymphs Abs: 1.1 10*3/uL (ref 0.7–4.0)
MCH: 28.6 pg (ref 26.0–34.0)
MCHC: 31.8 g/dL (ref 30.0–36.0)
MCV: 90.2 fL (ref 80.0–100.0)
Monocytes Absolute: 1.1 10*3/uL — ABNORMAL HIGH (ref 0.1–1.0)
Monocytes Relative: 15 %
Neutro Abs: 4.7 10*3/uL (ref 1.7–7.7)
Neutrophils Relative %: 63 %
Platelets: 235 10*3/uL (ref 150–400)
RBC: 4.47 MIL/uL (ref 3.87–5.11)
RDW: 15 % (ref 11.5–15.5)
WBC: 7.5 10*3/uL (ref 4.0–10.5)
nRBC: 0 % (ref 0.0–0.2)

## 2018-10-11 LAB — COMPREHENSIVE METABOLIC PANEL
ALT: 14 U/L (ref 0–44)
AST: 13 U/L — ABNORMAL LOW (ref 15–41)
Albumin: 3.6 g/dL (ref 3.5–5.0)
Alkaline Phosphatase: 91 U/L (ref 38–126)
Anion gap: 7 (ref 5–15)
BUN: 14 mg/dL (ref 8–23)
CO2: 26 mmol/L (ref 22–32)
Calcium: 10.4 mg/dL — ABNORMAL HIGH (ref 8.9–10.3)
Chloride: 106 mmol/L (ref 98–111)
Creatinine, Ser: 0.84 mg/dL (ref 0.44–1.00)
GFR calc Af Amer: >60 mL/min (ref >60)
GFR calc non Af Amer: >60 mL/min (ref >60)
Glucose, Bld: 101 mg/dL — ABNORMAL HIGH (ref 70–99)
Potassium: 4.2 mmol/L (ref 3.5–5.1)
Sodium: 139 mmol/L (ref 135–145)
Total Bilirubin: 0.3 mg/dL (ref 0.3–1.2)
Total Protein: 7 g/dL (ref 6.5–8.1)

## 2018-10-11 LAB — PROTIME-INR
INR: 3 — ABNORMAL HIGH (ref 0.8–1.2)
Prothrombin Time: 30.6 seconds — ABNORMAL HIGH (ref 11.4–15.2)

## 2018-10-12 LAB — CANCER ANTIGEN 27.29: CA 27.29: 46.7 U/mL — ABNORMAL HIGH (ref 0.0–38.6)

## 2018-10-15 ENCOUNTER — Other Ambulatory Visit: Payer: Self-pay | Admitting: *Deleted

## 2018-10-19 ENCOUNTER — Telehealth: Payer: Self-pay | Admitting: *Deleted

## 2018-10-19 ENCOUNTER — Ambulatory Visit (HOSPITAL_COMMUNITY): Admission: RE | Admit: 2018-10-19 | Payer: Medicare Other | Source: Ambulatory Visit

## 2018-10-19 NOTE — Telephone Encounter (Signed)
This RN returned VM left this afternoon by pt - stating " I need something called in for an UTI ".  This RN informed pt MD and Midlevel not in the office at this time- ideally we would need a urine sample for evaluation for antibiotic especially since she on coumadin.  Per discussion-this RN advised for pt to contact her primary MD per above for best follow up. Alicia Vasquez verbalized understanding and will contact him

## 2018-10-23 ENCOUNTER — Other Ambulatory Visit: Payer: Self-pay

## 2018-10-23 ENCOUNTER — Encounter: Payer: Self-pay | Admitting: Physician Assistant

## 2018-10-23 ENCOUNTER — Ambulatory Visit (INDEPENDENT_AMBULATORY_CARE_PROVIDER_SITE_OTHER): Payer: Medicare Other | Admitting: Physician Assistant

## 2018-10-23 ENCOUNTER — Ambulatory Visit: Payer: Medicare Other | Admitting: Physician Assistant

## 2018-10-23 DIAGNOSIS — M79675 Pain in left toe(s): Secondary | ICD-10-CM | POA: Diagnosis not present

## 2018-10-23 MED ORDER — CLINDAMYCIN HCL 300 MG PO CAPS: 300.0000 mg | ORAL_CAPSULE | Freq: Three times a day (TID) | ORAL | 0 refills | Status: DC

## 2018-10-23 NOTE — Progress Notes (Signed)
Virtual Visit via Video   I connected with Alicia Vasquez [place] on 10/23/18 at  4:00 PM EDT by a video enabled telemedicine application and verified that I am speaking with the correct person using two identifiers. Location patient: Home Location provider: [facility], Office Persons participating in the virtual visit: Taleeyah Bora Fauteux, [name] PA-C, Anselmo Pickler, LPN   I discussed the limitations of evaluation and management by telemedicine and the availability of in person appointments. The patient expressed understanding and agreed to proceed.  I acted as a Education administrator for [employer], [employer], LPN  Subjective:   HPI:   Left great toe infection Pt c/o left great toe swelling and pain. She endorses a small area of possible pus at the bottom of her toenail bed. She states that this started on Friday. She also reports slight area of erythema on her left foot since the incident. She states that she usually goes to a podiatrist to get her toenails trimmed but due to the pandemic, she hasn't been able to go. She is uncertain if she stubbed her toe on something. She has history of ingrown toenail and states that this feels the same.  Denies: fever, chills, pain in calf, chest pain, SOB  ROS: See pertinent positives and negatives per HPI.  Patient Active Problem List   Diagnosis Date Noted  . Aortic atherosclerosis ([place]) 06/13/2017  . Peri-prosthetic fracture of shaft of femur 01/26/2017  . Pain from bone metastases ([place]) 01/25/2017  . Bone metastases ([place]) 01/25/2017  . Endometrial hyperplasia 01/16/2017  . Lytic bone lesion of left femur 01/15/2017  . Hip fracture, pathological ([place]) 01/15/2017  . Lymphedema 09/17/2015  . Long term current use of anticoagulant therapy 08/23/2015  . DVT, lower extremity ([place]) 06/18/2015  . Bilateral knee pain 04/03/2015  . Arm edema 08/28/2014  . Vitamin D deficiency 04/23/2014  . Hyperparathyroidism ([place]) 04/23/2014  . Depression  07/18/2013  . Malignant neoplasm of upper-outer quadrant of right breast in female, estrogen receptor positive ([place]) 07/15/2013  . Overactive bladder 01/30/2013  . Primary hypercoagulable state ([place]) 12/19/2012  . HTN (hypertension) 09/27/2011  . Hearing loss 09/27/2011  . Seasonal allergies 09/27/2011  . Hypothyroid 08/26/2011    Social History   Tobacco Use  . Smoking status: Never Smoker  . Smokeless tobacco: Never Used  Substance Use Topics  . Alcohol use: No    Alcohol/week: 0.0 standard drinks    Current Outpatient Medications:  .  Cholecalciferol (VITAMIN D3) 5000 UNITS TABS, Take 5,000 Units by mouth daily. , Disp: , Rfl:  .  Denosumab (XGEVA [place]), Inject into the skin every 30 (thirty) days., Disp: , Rfl:  .  HYDROcodone-acetaminophen (NORCO/VICODIN) 5-325 MG tablet, Take 1 tablet by mouth every 6 (six) hours as needed for moderate pain., Disp: 120 tablet, Rfl: 0 .  SYNTHROID 125 MCG tablet, TAKE 1 TABLET BY MOUTH EVERY DAY BEFORE BREAKFAST, Disp: 30 tablet, Rfl: 2 .  vitamin C (ASCORBIC ACID) 250 MG tablet, Take 250 mg by mouth 3 (three) times daily., Disp: , Rfl:  .  warfarin (COUMADIN) 5 MG tablet, TAKE 1.5-2 TABLETS ONE TIME ONLY AT 6 PM FOR 1 DOSE. TAKE 7.5 MG ALTERNATING WITH 10 MG DAILY, Disp: 270 tablet, Rfl: 1 .  clindamycin (CLEOCIN) 300 MG capsule, Take 1 capsule (300 mg total) by mouth 3 (three) times daily for 10 days., Disp: 30 capsule, Rfl: 0  Allergies  Allergen Reactions  . Anesthetics, Amide Hypertension  . Benadryl [  Diphenhydramine Hcl] Other (See Comments)    Dizziness  . Carbocaine [Mepivacaine Hcl] Hypertension  . Codeine Other (See Comments)    Dizziness  . Epinephrine Hypertension  . Sulfa Antibiotics Other (See Comments)    dizziness  . Latex Other (See Comments)    Blisters in mouth  . Tramadol     Sedation.   Marland Kitchen Penicillins Rash    Has patient had a PCN reaction causing immediate rash, facial/tongue/throat swelling, SOB or lightheadedness  with hypotension: Unknown Has patient had a PCN reaction causing severe rash involving mucus membranes or skin necrosis: Unknown Has patient had a PCN reaction that required hospitalization: Unknown Has patient had a PCN reaction occurring within the last 10 years: Unknown If all of the above answers are "NO", then may proceed with Cephalosporin use.     Objective:   VITALS: Per patient if applicable, see vitals. GENERAL: Alert, appears well and in no acute distress. HEENT: Atraumatic, conjunctiva clear, no obvious abnormalities on inspection of external nose and ears. NECK: Normal movements of the head and neck. CARDIOPULMONARY: No increased WOB. Speaking in clear sentences. I:E ratio WNL.  MS: Moves all visible extremities without noticeable abnormality. PSYCH: Pleasant and cooperative, well-groomed. Speech normal rate and rhythm. Affect is appropriate. Insight and judgement are appropriate. Attention is focused, linear, and appropriate.  NEURO: CN grossly intact. Oriented as arrived to appointment on time with no prompting. Moves both UE equally.  SKIN: L great toe with area of white swelling to proximal nail fold; difficult to visualize any swelling/redness to foot or ankle in video encounter  Assessment and Plan:   Esta was seen today for left great toe infected.  Diagnoses and all orders for this visit:  Toe pain, left Suspect possible ingrown toenail. Will start clindamycin and have patient follow-up with me on Friday, sooner if issues. Worsening precautions advised.  Other orders -     clindamycin (CLEOCIN) 300 MG capsule; Take 1 capsule (300 mg total) by mouth 3 (three) times daily for 10 days.  . Reviewed expectations re: course of current medical issues. . Discussed self-management of symptoms. . Outlined signs and symptoms indicating need for more acute intervention. . Patient verbalized understanding and all questions were answered. Marland Kitchen Health Maintenance issues  including appropriate healthy diet, exercise, and smoking avoidance were discussed with patient. . See orders for this visit as documented in the electronic medical record.  I discussed the assessment and treatment plan with the patient. The patient was provided an opportunity to ask questions and all were answered. The patient agreed with the plan and demonstrated an understanding of the instructions.   The patient was advised to call back or seek an in-person evaluation if the symptoms worsen or if the condition fails to improve as anticipated.   CMA or LPN served as scribe during this visit. History, Physical, and Plan performed by medical provider. The above documentation has been reviewed and is accurate and complete.  [place], [place] 10/23/2018

## 2018-10-26 ENCOUNTER — Telehealth: Payer: Self-pay

## 2018-10-26 ENCOUNTER — Telehealth: Payer: Self-pay | Admitting: *Deleted

## 2018-10-26 ENCOUNTER — Ambulatory Visit: Payer: Medicare Other | Admitting: Family Medicine

## 2018-10-26 ENCOUNTER — Ambulatory Visit: Payer: Medicare Other | Admitting: Physician Assistant

## 2018-10-26 NOTE — Telephone Encounter (Signed)
Pt called at 1413 to request a refill on her hydrocodone.  Explained to pt Dr Jana Hakim has already left for the day.  Pt states she can wait until Monday.  Informed pt Dr Jana Hakim will address Mon 6/1 morning.

## 2018-10-26 NOTE — Telephone Encounter (Signed)
Spoke to pt asked her how her left great toe was? Pt said white area at bottom of nailbed has increased and she is having more pain in her toe. Left foot is swollen and redness is the same. Asked pt if taking antibiotic? Pt said yes. Denies fever or chills. Told pt Aldona Bar would like her to see Dr. Jerline Pain this afternoon to have her toe evaluated may need to open are so it can drain. Pt verbalized understanding. Appointment scheduled for 1:40 pm with Dr. Jerline Pain. Pt verbalized understanding and said she will try to get a ride. Told pt if she can not make it to please call.

## 2018-10-29 ENCOUNTER — Encounter: Payer: Self-pay | Admitting: Family Medicine

## 2018-10-29 ENCOUNTER — Other Ambulatory Visit: Payer: Self-pay

## 2018-10-29 ENCOUNTER — Other Ambulatory Visit: Payer: Self-pay | Admitting: Oncology

## 2018-10-29 ENCOUNTER — Ambulatory Visit (INDEPENDENT_AMBULATORY_CARE_PROVIDER_SITE_OTHER): Payer: Medicare Other | Admitting: Family Medicine

## 2018-10-29 VITALS — BP 176/77 | HR 91 | Temp 98.0°F

## 2018-10-29 DIAGNOSIS — L600 Ingrowing nail: Secondary | ICD-10-CM | POA: Diagnosis not present

## 2018-10-29 DIAGNOSIS — M84559P Pathological fracture in neoplastic disease, hip, unspecified, subsequent encounter for fracture with malunion: Secondary | ICD-10-CM

## 2018-10-29 MED ORDER — HYDROCODONE-ACETAMINOPHEN 5-325 MG PO TABS: 1.0000 | ORAL_TABLET | Freq: Four times a day (QID) | ORAL | 0 refills | Status: DC | PRN

## 2018-10-29 MED ORDER — CLINDAMYCIN HCL 300 MG PO CAPS: 300.0000 mg | ORAL_CAPSULE | Freq: Three times a day (TID) | ORAL | 0 refills | Status: AC

## 2018-10-29 NOTE — Telephone Encounter (Signed)
On Friday patient had a 1:40pm appointment scheduled. She called and said that "she was on her way and did not know if she would arrive on time because her and her friend were coming from [employer]." I spoke with her and advised that I checked with Museum/gallery conservator, Dr. Marigene Ehlers clinical personnel who stated that "if she was late, Dr. Jerline Pain said he would not be able to see her as he had patients after her and was booked for the afternoon." I told the patient this and also told her that I checked other providers schedules and we were booked. I advised the patient of the 10 min late policy and I attempted to have her reschedule so she did not have to drive all the way to the office to reschedule due to being late. The patient insisted that she was on the way and asked if we could call her at 1:50pm if she was not at the office. I told her I would ask the clinical team and the call ended.  I called the patient back at 1:45pm because I did not want the time to go over and knew the clinical staff was occupied with other patients to call her at a specific time to reschedule an appointment because she was late. I spoke with the patient who stated that "she did not want to reschedule." I explained again that "if she was late she would have to reschedule." I confirmed with Amber that the patient could go to urgent care to get the care she needed if she was late rather than the ED if needed. I explained this to the patient and she agreed to rescheduling to the 3pm appointment for 10/29/2018. I encouraged her to arrive at 2:30pm or before to avoid being late so she could be seen. She stated she understood.   The patient called today and I took the call from the Patient [place]. The patient asked if Dr. Juleen China had any availability for today. I told her that "Dr. Juleen China was booked and looked at tomorrow (I did see that she had 2 20 min slots throughout the day however, the patient has restrictions for 40 min only). I told  her that they next 40 min slot I see is for Wednesday and I did not want her to go until Wednesday with this issue." I explained the importance of her coming early for today's appointment and arriving around 2:30pm to allow for travel time, etc. The patient stated "I have wait many times longer than needed. If I get my foot cut off I am not going to be happy." I empathized with the patient that we do not want that to be the result so in order to help she has to be on time so she can be seen." She stated that "she was in front of the office on Friday" and I reminded her that I was actually who she spoke with and she was not close to the office which is why we rescheduled and she agreed to rescheduling. I once again attempted to encourage the patient to get to the office early to avoid any issues with being seen. She stated "she understood and would be on time for her 3pm appointment today."

## 2018-10-29 NOTE — Progress Notes (Signed)
   Chief Complaint:  Alicia Vasquez is a 78 y.o. female who presents for same day appointment with a chief complaint of toe pain.   Assessment/Plan:  Ingrown toenail Infection seems to be improving.  Continue clindamycin.  Offered removal today, however patient declined.  Recommended Epson salt soaks.  Discussed reasons to return to care.  Follow-up as needed.     Subjective:  HPI:  Toe Pain, acute problem Started about a week ago.  Located on left great toe.  She was started on course of clindamycin.  Symptoms seem to be improving.  She had some purulent drainage about 5 days ago which is since resolved.  Stye is a little pain but seems to be improving.  Worse with movement.  No other obvious alleviating or aggravating factors.    ROS: Per HPI  PMH: She reports that she has never smoked. She has never used smokeless tobacco. She reports that she does not drink alcohol or use drugs.      Objective:  Physical Exam: BP (!) 176/77 (BP Location: Left Arm, Patient Position: Sitting, Cuff Size: Large)   Pulse 91   Temp 98 F (36.7 C) (Oral)   SpO2 96%   Gen: NAD, resting comfortably MSK: Left great toe with erythema involving medial nail fold.  No drainage.  Tender to palpation.     Algis Greenhouse. Jerline Pain, MD 10/29/2018 4:43 PM

## 2018-10-29 NOTE — Telephone Encounter (Signed)
FYI

## 2018-10-29 NOTE — Patient Instructions (Signed)
It was very nice to see you today!  I am glad that things are getting better!  Please continue the antibiotic. You can use epsom salt soaks as well.  Let me or Dr Juleen China know if your symptoms are not improving.   Take care, Dr Jerline Pain

## 2018-10-31 ENCOUNTER — Inpatient Hospital Stay: Payer: Medicare Other

## 2018-10-31 ENCOUNTER — Inpatient Hospital Stay (HOSPITAL_BASED_OUTPATIENT_CLINIC_OR_DEPARTMENT_OTHER): Payer: Medicare Other | Admitting: Oncology

## 2018-10-31 ENCOUNTER — Other Ambulatory Visit: Payer: Self-pay

## 2018-10-31 ENCOUNTER — Inpatient Hospital Stay: Payer: Medicare Other | Attending: Oncology

## 2018-10-31 VITALS — BP 161/63 | HR 83 | Temp 98.3°F | Resp 18 | Ht 64.0 in | Wt 234.2 lb

## 2018-10-31 DIAGNOSIS — E039 Hypothyroidism, unspecified: Secondary | ICD-10-CM

## 2018-10-31 DIAGNOSIS — Z923 Personal history of irradiation: Secondary | ICD-10-CM | POA: Insufficient documentation

## 2018-10-31 DIAGNOSIS — M84459A Pathological fracture, hip, unspecified, initial encounter for fracture: Secondary | ICD-10-CM

## 2018-10-31 DIAGNOSIS — Z800 Family history of malignant neoplasm of digestive organs: Secondary | ICD-10-CM

## 2018-10-31 DIAGNOSIS — G893 Neoplasm related pain (acute) (chronic): Secondary | ICD-10-CM

## 2018-10-31 DIAGNOSIS — I10 Essential (primary) hypertension: Secondary | ICD-10-CM | POA: Diagnosis not present

## 2018-10-31 DIAGNOSIS — Z8042 Family history of malignant neoplasm of prostate: Secondary | ICD-10-CM

## 2018-10-31 DIAGNOSIS — F419 Anxiety disorder, unspecified: Secondary | ICD-10-CM | POA: Insufficient documentation

## 2018-10-31 DIAGNOSIS — Z86718 Personal history of other venous thrombosis and embolism: Secondary | ICD-10-CM | POA: Insufficient documentation

## 2018-10-31 DIAGNOSIS — C50411 Malignant neoplasm of upper-outer quadrant of right female breast: Secondary | ICD-10-CM

## 2018-10-31 DIAGNOSIS — Z170 Estrogen receptor positive status [ER+]: Secondary | ICD-10-CM | POA: Insufficient documentation

## 2018-10-31 DIAGNOSIS — C7951 Secondary malignant neoplasm of bone: Secondary | ICD-10-CM | POA: Diagnosis not present

## 2018-10-31 DIAGNOSIS — M899 Disorder of bone, unspecified: Secondary | ICD-10-CM

## 2018-10-31 DIAGNOSIS — Z6841 Body Mass Index (BMI) 40.0 and over, adult: Secondary | ICD-10-CM | POA: Insufficient documentation

## 2018-10-31 DIAGNOSIS — I700 Atherosclerosis of aorta: Secondary | ICD-10-CM

## 2018-10-31 DIAGNOSIS — I82401 Acute embolism and thrombosis of unspecified deep veins of right lower extremity: Secondary | ICD-10-CM

## 2018-10-31 DIAGNOSIS — M8580 Other specified disorders of bone density and structure, unspecified site: Secondary | ICD-10-CM | POA: Insufficient documentation

## 2018-10-31 DIAGNOSIS — Z79899 Other long term (current) drug therapy: Secondary | ICD-10-CM | POA: Diagnosis not present

## 2018-10-31 DIAGNOSIS — Z7901 Long term (current) use of anticoagulants: Secondary | ICD-10-CM

## 2018-10-31 DIAGNOSIS — Z5111 Encounter for antineoplastic chemotherapy: Secondary | ICD-10-CM | POA: Diagnosis not present

## 2018-10-31 DIAGNOSIS — D6859 Other primary thrombophilia: Secondary | ICD-10-CM

## 2018-10-31 DIAGNOSIS — M84659P Pathological fracture in other disease, hip, unspecified, subsequent encounter for fracture with malunion: Secondary | ICD-10-CM

## 2018-10-31 LAB — CBC WITH DIFFERENTIAL/PLATELET
Abs Immature Granulocytes: 0.03 10*3/uL (ref 0.00–0.07)
Basophils Absolute: 0.1 10*3/uL (ref 0.0–0.1)
Basophils Relative: 1 %
Eosinophils Absolute: 0.3 10*3/uL (ref 0.0–0.5)
Eosinophils Relative: 3 %
HCT: 40.2 % (ref 36.0–46.0)
Hemoglobin: 12.6 g/dL (ref 12.0–15.0)
Immature Granulocytes: 0 %
Lymphocytes Relative: 16 %
Lymphs Abs: 1.3 10*3/uL (ref 0.7–4.0)
MCH: 28.4 pg (ref 26.0–34.0)
MCHC: 31.3 g/dL (ref 30.0–36.0)
MCV: 90.5 fL (ref 80.0–100.0)
Monocytes Absolute: 1.2 10*3/uL — ABNORMAL HIGH (ref 0.1–1.0)
Monocytes Relative: 15 %
Neutro Abs: 5.2 10*3/uL (ref 1.7–7.7)
Neutrophils Relative %: 65 %
Platelets: 230 10*3/uL (ref 150–400)
RBC: 4.44 MIL/uL (ref 3.87–5.11)
RDW: 14.8 % (ref 11.5–15.5)
WBC: 7.9 10*3/uL (ref 4.0–10.5)
nRBC: 0 % (ref 0.0–0.2)

## 2018-10-31 LAB — PROTIME-INR
INR: 2.7 — ABNORMAL HIGH (ref 0.8–1.2)
Prothrombin Time: 28 seconds — ABNORMAL HIGH (ref 11.4–15.2)

## 2018-10-31 MED ORDER — FULVESTRANT 250 MG/5ML IM SOLN
500.0000 mg | Freq: Once | INTRAMUSCULAR | Status: AC
  Administered 2018-10-31: 500 mg via INTRAMUSCULAR

## 2018-10-31 MED ORDER — FULVESTRANT 250 MG/5ML IM SOLN
INTRAMUSCULAR | Status: AC
  Filled 2018-10-31: qty 5

## 2018-10-31 NOTE — Progress Notes (Signed)
[facility]  [phone] [phone]     ID: Alicia Vasquez OB: [birth year]  MR#: [id number]  PYP#:[id number]  Patient Care Team: Alicia Deutscher, DO as PCP - General (Family Medicine) Alicia Kingdom, MD as Consulting Physician (Internal Medicine) Alicia Renshaw, MD as Referring Physician (Internal Medicine) Alicia Vasquez, Alicia Dad, MD as Consulting Physician (Oncology) Alicia Vasquez, DDS as Consulting Physician (Dentistry) Alicia Vasquez, [place] as Consulting Physician (Physician Assistant) Alicia Vasquez, DPM as Consulting Physician (Podiatry) OTHER MD:   CHIEF COMPLAINT: Estrogen receptor positive breast cancer  CURRENT TREATMENT:  Fulvestrant; warfarin   INTERVAL HISTORY: Alicia Vasquez is here today for follow up and treatment of her metastatic estrogen receptor positive breast cancer.  Alicia Vasquez receives fulvestrant every 4 weeks, and is tolerating it well.    She is also taking Coumadin as directed. Her INRs have been as follows: Lab Results  Component Value Date   INR 2.7 (H) 10/31/2018   INR 3.0 (H) 10/11/2018   INR 1.6 (H) 10/03/2018   INR 2.8 (H) 09/05/2018   INR 2.6 (H) 08/08/2018   We are also following her tumor markers: Lab Results  Component Value Date   CA2729 46.7 (H) 10/11/2018   CA2729 34.1 10/03/2018   CA2729 37.9 09/05/2018   CA2729 38.5 08/08/2018   CA2729 35.7 07/11/2018   She is scheduled to undergo chest/abdomen/pelvis CT scans on 11/08/2018.   REVIEW OF SYSTEMS: Alicia Vasquez reports doing okay overall. She notes an infection to an ingrown toenail, which has caused numbness up her leg.  She is currently on clindamycin for that.  She continues to have issues with her teeth. She was referred to an oral surgeon, but she reports she refused to see him after looking him up online. She ambulates with a wheelchair most of the time, sometimes with a walker, and she denies any falls. She lives with her friend, Alicia Vasquez, who does the cooking.  She denies constipation and attributes this to Alicia Vasquez's health-conscious cooking.   The patient denies unusual headaches, visual changes, nausea, vomiting, stiff neck, dizziness, or gait imbalance. There has been no cough, phlegm production, or pleurisy, no chest pain or pressure, and no change in bowel or bladder habits. The patient denies fever, rash, bleeding, unexplained fatigue or unexplained weight loss. A detailed review of systems was otherwise entirely negative.   BREAST CANCER HISTORY: From doctor Alicia Vasquez's intake node 07/24/2013:  "78 y.o. female. Who presented with SOB and had a CT chest performed that revealed a right breast mass. Mammogram/ultrsound on 2/13 showed a mass in the 11 o'clock position in the right breast measuring 1.5 cm. Also noted was a right axillary LN measuring 1.6 cm. MRI not performed. Biopsy of mass and lymph done. 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 Lymph node + for metastatic carcinoma."  [On 09/09/2013 the patient underwent right lumpectomy and sentinel lymph node sampling. This showed ([id number]) multifocal invasive ductal carcinoma, grade 1, the largest lesion measuring 1.8 cm, the second lesion 1.2 cm. One of 4 sentinel lymph nodes was positive, with extracapsular extension. Margins were positive. HER-2 was repeated and was again negative. Further surgery 09/16/2013 obtained clear margins.  Her subsequent history is as detailed below   PAST MEDICAL HISTORY: Past Medical History:  Diagnosis Date   Allergy    Anxiety    Arthritis    Blood transfusion without reported diagnosis  Breast cancer ([place]) 07/12/2013   Invasive Mammary Carcinoma   DVT (deep vein thrombosis) in pregnancy    Hypertension    Hypothyroid    Personal history of radiation therapy    Pneumonia    Radiation 11/21/13-01/07/14   Right  Breast/Supraclavicular    PAST SURGICAL HISTORY: Past Surgical History:  Procedure Laterality Date   BREAST LUMPECTOMY Left 2015   BREAST LUMPECTOMY WITH RADIOACTIVE SEED LOCALIZATION Right 09/09/2013   Procedure: BREAST LUMPECTOMY WITH RADIOACTIVE SEED LOCALIZATION WITH AXILLARY NODE EXCISION;  Surgeon: Alicia Bookbinder, MD;  Location: [facility];  Service: General;  Laterality: Right;   DENTAL SURGERY  04/19/2012   13 TEETH REMOVED   DILATION AND CURETTAGE OF UTERUS     ORIF PERIPROSTHETIC FRACTURE Left 01/31/2017   Procedure: REVISION and OPEN REDUCTION INTERNAL FIXATION (ORIF) PERIPROSTHETIC FRACTURE LEFT HIP;  Surgeon: Paralee Cancel, MD;  Location: [facility];  Service: Orthopedics;  Laterality: Left;  120 mins   RE-EXCISION OF BREAST LUMPECTOMY Right 09/24/2013   Procedure: RE-EXCISION OF RIGHT BREAST LUMPECTOMY;  Surgeon: Alicia Bookbinder, MD;  Location: [facility];  Service: General;  Laterality: Right;   TOTAL HIP ARTHROPLASTY Left 01/16/2017   Procedure: TOTAL HIP ARTHROPLASTY POSTERIOR;  Surgeon: Paralee Cancel, MD;  Location: [facility];  Service: Orthopedics;  Laterality: Left;    FAMILY HISTORY Family History  Problem Relation Age of Onset   Heart disease Brother    Colon cancer Brother    Prostate cancer Brother    the patient's father died at the age of 15 after an automobile accident. The patient's mother died at the age of 40. She was a Marine scientist here in [name] in the old [facility]. She was infected with polio and was confined to a wheelchair for a good part of her life. She eventually died of pneumonia. The patient had one brother, who died with prostate cancer. She had no sisters. There is no history of breast or ovarian cancer in the family.   GYNECOLOGIC HISTORY:  Menarche age 56, first live birth age 21, the patient is GX P1. She went through the change of life at age 47. 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 She is widowed. Currently she is staying with her friend Alicia Vasquez,  61, who is a retired Radio producer. The patient's son [name] lives in [place]. He works an Engineer, technical sales. The patient has no grandchildren. She is a Tourist information centre manager but currently attends a [employer] with her friend Alicia Vasquez   ADVANCED DIRECTIVES: Not in place   HEALTH MAINTENANCE: Social History   Tobacco Use   Smoking status: Never Smoker   Smokeless tobacco: Never Used  Substance Use Topics   Alcohol use: No    Alcohol/week: 0.0 standard drinks   Drug use: No     Colonoscopy: Never  PAP:  Bone density: 09/20/2016 showed a T score of -2.2  Lipid panel:  Allergies  Allergen Reactions   Anesthetics, Amide Hypertension   Benadryl [Diphenhydramine Hcl] Other (See Comments)    Dizziness   Carbocaine [Mepivacaine Hcl] Hypertension   Codeine Other (See Comments)    Dizziness   Epinephrine Hypertension   Sulfa Antibiotics Other (See Comments)    dizziness   Latex Other (See Comments)    Blisters in mouth   Tramadol     Sedation.    Penicillins Rash    Has patient had  a PCN reaction causing immediate rash, facial/tongue/throat swelling, SOB or lightheadedness with hypotension: Unknown Has patient had a PCN reaction causing severe rash involving mucus membranes or skin necrosis: Unknown Has patient had a PCN reaction that required hospitalization: Unknown Has patient had a PCN reaction occurring within the last 10 years: Unknown If all of the above answers are "NO", then may proceed with Cephalosporin use.     Current Outpatient Medications  Medication Sig Dispense Refill   Cholecalciferol (VITAMIN D3) 5000 UNITS TABS Take 5,000 Units by mouth daily.      clindamycin (CLEOCIN) 300 MG capsule Take 1 capsule (300 mg total) by mouth 3 (three) times daily for 10 days. 30 capsule 0   Denosumab (XGEVA Idamay) Inject into the skin  every 30 (thirty) days.     HYDROcodone-acetaminophen (NORCO/VICODIN) 5-325 MG tablet Take 1 tablet by mouth every 6 (six) hours as needed for moderate pain. 120 tablet 0   SYNTHROID 125 MCG tablet TAKE 1 TABLET BY MOUTH EVERY DAY BEFORE BREAKFAST 30 tablet 2   vitamin C (ASCORBIC ACID) 250 MG tablet Take 250 mg by mouth 3 (three) times daily.     warfarin (COUMADIN) 5 MG tablet TAKE 1.5-2 TABLETS ONE TIME ONLY AT 6 PM FOR 1 DOSE. TAKE 7.5 MG ALTERNATING WITH 10 MG DAILY 270 tablet 1   No current facility-administered medications for this visit.     OBJECTIVE: Morbidly obese white woman examined in a wheelchair Vitals:   10/31/18 1245  BP: (!) 161/63  Pulse: 83  Resp: 18  Temp: 98.3 F (36.8 C)  SpO2: 95%     Body mass index is 40.2 kg/m.    ECOG FS: 2 - Symptomatic, <50% confined to bed  Sclerae unicteric, EOMs intact Wearing a mask No cervical or supraclavicular adenopathy Lungs no rales or rhonchi Heart regular rate and rhythm Abd soft, nontender, positive bowel sounds MSK no focal spinal tenderness, no upper extremity lymphedema Neuro: nonfocal, well oriented, appropriate affect Breasts: Deferred    LAB RESULTS:  CMP     Component Value Date/Time   NA 139 10/11/2018 1220   NA 141 05/16/2017 1416   K 4.2 10/11/2018 1220   K 3.8 05/16/2017 1416   CL 106 10/11/2018 1220   CO2 26 10/11/2018 1220   CO2 27 05/16/2017 1416   GLUCOSE 101 (H) 10/11/2018 1220   GLUCOSE 140 05/16/2017 1416   BUN 14 10/11/2018 1220   BUN 13.2 05/16/2017 1416   CREATININE 0.84 10/11/2018 1220   CREATININE 0.77 03/21/2018 1417   CREATININE 0.80 09/22/2017 1555   CREATININE 0.8 05/16/2017 1416   CALCIUM 10.4 (H) 10/11/2018 1220   CALCIUM 11.0 (H) 05/16/2017 1416   PROT 7.0 10/11/2018 1220   PROT 6.9 05/16/2017 1416   ALBUMIN 3.6 10/11/2018 1220   ALBUMIN 3.6 05/16/2017 1416   AST 13 (L) 10/11/2018 1220   AST 14 (L) 03/21/2018 1417   AST 13 05/16/2017 1416   ALT 14 10/11/2018  1220   ALT 14 03/21/2018 1417   ALT <6 05/16/2017 1416   ALKPHOS 91 10/11/2018 1220   ALKPHOS 130 05/16/2017 1416   BILITOT 0.3 10/11/2018 1220   BILITOT 0.3 03/21/2018 1417   BILITOT 0.31 05/16/2017 1416   GFRNONAA >60 10/11/2018 1220   GFRNONAA >60 03/21/2018 1417   GFRNONAA 75 08/19/2015 1602   GFRAA >60 10/11/2018 1220   GFRAA >60 03/21/2018 1417   GFRAA 87 08/19/2015 1602    I No results found for: SPEP  Lab Results  Component Value Date   WBC 7.9 10/31/2018   NEUTROABS 5.2 10/31/2018   HGB 12.6 10/31/2018   HCT 40.2 10/31/2018   MCV 90.5 10/31/2018   PLT 230 10/31/2018      Chemistry      Component Value Date/Time   NA 139 10/11/2018 1220   NA 141 05/16/2017 1416   K 4.2 10/11/2018 1220   K 3.8 05/16/2017 1416   CL 106 10/11/2018 1220   CO2 26 10/11/2018 1220   CO2 27 05/16/2017 1416   BUN 14 10/11/2018 1220   BUN 13.2 05/16/2017 1416   CREATININE 0.84 10/11/2018 1220   CREATININE 0.77 03/21/2018 1417   CREATININE 0.80 09/22/2017 1555   CREATININE 0.8 05/16/2017 1416      Component Value Date/Time   CALCIUM 10.4 (H) 10/11/2018 1220   CALCIUM 11.0 (H) 05/16/2017 1416   ALKPHOS 91 10/11/2018 1220   ALKPHOS 130 05/16/2017 1416   AST 13 (L) 10/11/2018 1220   AST 14 (L) 03/21/2018 1417   AST 13 05/16/2017 1416   ALT 14 10/11/2018 1220   ALT 14 03/21/2018 1417   ALT <6 05/16/2017 1416   BILITOT 0.3 10/11/2018 1220   BILITOT 0.3 03/21/2018 1417   BILITOT 0.31 05/16/2017 1416       No results found for: LABCA2  No components found for: LABCA125  Recent Labs  Lab 10/31/18 1148  INR 2.7*    Urinalysis    Component Value Date/Time   COLORURINE STRAW (A) 02/20/2018 1606   APPEARANCEUR CLEAR 02/20/2018 1606   LABSPEC 1.009 02/20/2018 1606   LABSPEC 1.010 09/22/2016 1553   PHURINE 6.0 02/20/2018 1606   GLUCOSEU NEGATIVE 02/20/2018 1606   GLUCOSEU Negative 09/22/2016 1553   HGBUR NEGATIVE 02/20/2018 1606   BILIRUBINUR Negative 03/16/2018  1433   BILIRUBINUR Negative 09/22/2016 1553   KETONESUR NEGATIVE 02/20/2018 1606   PROTEINUR Negative 03/16/2018 1433   PROTEINUR NEGATIVE 02/20/2018 1606   UROBILINOGEN 0.2 03/16/2018 1433   UROBILINOGEN 0.2 11/15/2016 1358   UROBILINOGEN 0.2 09/22/2016 1553   NITRITE Negative 03/16/2018 1433   NITRITE NEGATIVE 02/20/2018 1606   LEUKOCYTESUR Negative 03/16/2018 1433   LEUKOCYTESUR Negative 09/22/2016 1553    STUDIES: Restaging studies scheduled for later this month  ASSESSMENT: 78 y.o. [place] woman status post right breast upper outer quadrant lumpectomy and sentinel lymph node sampling 09/09/2013 for an mpT1c pN1a, stage IIA invasive ductal carcinoma, estrogen and progesterone receptor both 100% positive with strong staining intensity, MIB-1 of 17% and no HER-2 amplification  (1) additional surgery for margin clearance 09/16/2013 obtained negative margins  (2) Oncotype DX recurrence score of 4 predicts a risk of outside the breast recurrence within 10 years of 7% if the patient's only systemic therapy is tamoxifen for 5 years. It also predicts no benefit from chemotherapy  (3) adjuvant radiation completed 01/07/2014  (4) anastrozole started 02/27/2014 stopped within 2 weeks because of arm swelling.   (a) bone density April 2016 showed osteopenia, with a t-score of -1.6  (b) anastrozole resumed 12/17/2015  (c) Bone density 09/20/2016 showed a T score of -2.2  (5) history of left lower extremity DVT 11/23/2012, initially on rivaroxaban, which caused chest pain, switch to Coumadin July 2014  (b) coumadin dose increased to 7.5/10 mg alternating days as of 06/13/2017   METASTATIC DISEASE: August 2018 (6) status post left total hip replacement 01/16/2017 for estrogen receptor positive adenocarcinoma.  (a) CA 27-29 was 46.4 as of 04/18/2017  (b) chest CT  scan 05/02/2017 shows no lung or liver lesion concern; it does show aortic atherosclerosis  (c) baseline bone scan 05/02/2017  was negative  (d) PET scan 09/12/2017 shows no active disease, including bone  (e) PET scan and CT chest on 05/29/2018 show no active disease  (7) fulvestrant started 04/18/2017  (8) denosumab/Xgeva started 05/16/2017  (a) changed to every 12-weeks after 10/03/2017 dose   (b) held starting with 06/13/2018 dose due to dental concerns  PLAN:  Alicia Vasquez is now close to 2 years out from definitive diagnosis of metastatic breast cancer, with very well-controlled disease.  This is favorable.  He is tolerating the fulvestrant well and she will receive treatment today and every 4 weeks indefinitely until there is evidence of progression.  She has not been able to get her teeth taken care of so I am reluctant to resume denosumab at this point.  She will have restaging studies 11/08/2018.  I am anticipating good news there.  The left big toe nail is slightly ingrown and there is minimal erythema but there is no evidence of suppuration or infection.  She will return to see [name] in 4 weeks.  She knows to call for any other issue that may develop before that visit.  Alicia Vasquez. Alicia Pickerel, MD  10/31/18 12:59 PM Medical Oncology and Hematology [facility] [address] [place], [place] [address] Tel. [phone]    Fax. [phone]   I, Wilburn Mylar, am acting as scribe for Dr. Virgie Vasquez. Alicia Vasquez.

## 2018-10-31 NOTE — Patient Instructions (Signed)

## 2018-11-01 ENCOUNTER — Telehealth: Payer: Self-pay | Admitting: Oncology

## 2018-11-01 LAB — CANCER ANTIGEN 27.29: CA 27.29: 50 U/mL — ABNORMAL HIGH (ref 0.0–38.6)

## 2018-11-01 NOTE — Telephone Encounter (Signed)
Talk with patient regarding schedule °

## 2018-11-08 ENCOUNTER — Ambulatory Visit (HOSPITAL_COMMUNITY): Admission: RE | Admit: 2018-11-08 | Payer: Medicare Other | Source: Ambulatory Visit

## 2018-11-28 ENCOUNTER — Encounter: Payer: Self-pay | Admitting: Adult Health

## 2018-11-28 ENCOUNTER — Inpatient Hospital Stay: Payer: Medicare Other

## 2018-11-28 ENCOUNTER — Inpatient Hospital Stay (HOSPITAL_BASED_OUTPATIENT_CLINIC_OR_DEPARTMENT_OTHER): Payer: Medicare Other | Admitting: Adult Health

## 2018-11-28 ENCOUNTER — Inpatient Hospital Stay: Payer: Medicare Other | Attending: Oncology

## 2018-11-28 ENCOUNTER — Other Ambulatory Visit: Payer: Self-pay

## 2018-11-28 VITALS — BP 160/72 | HR 75 | Temp 99.1°F | Resp 18 | Ht 64.0 in | Wt 233.7 lb

## 2018-11-28 DIAGNOSIS — C50411 Malignant neoplasm of upper-outer quadrant of right female breast: Secondary | ICD-10-CM

## 2018-11-28 DIAGNOSIS — Z79899 Other long term (current) drug therapy: Secondary | ICD-10-CM

## 2018-11-28 DIAGNOSIS — I10 Essential (primary) hypertension: Secondary | ICD-10-CM

## 2018-11-28 DIAGNOSIS — Z923 Personal history of irradiation: Secondary | ICD-10-CM | POA: Diagnosis not present

## 2018-11-28 DIAGNOSIS — M1990 Unspecified osteoarthritis, unspecified site: Secondary | ICD-10-CM

## 2018-11-28 DIAGNOSIS — Z170 Estrogen receptor positive status [ER+]: Secondary | ICD-10-CM

## 2018-11-28 DIAGNOSIS — R600 Localized edema: Secondary | ICD-10-CM

## 2018-11-28 DIAGNOSIS — M8580 Other specified disorders of bone density and structure, unspecified site: Secondary | ICD-10-CM

## 2018-11-28 DIAGNOSIS — I700 Atherosclerosis of aorta: Secondary | ICD-10-CM

## 2018-11-28 DIAGNOSIS — Z7901 Long term (current) use of anticoagulants: Secondary | ICD-10-CM | POA: Insufficient documentation

## 2018-11-28 DIAGNOSIS — I82401 Acute embolism and thrombosis of unspecified deep veins of right lower extremity: Secondary | ICD-10-CM

## 2018-11-28 DIAGNOSIS — M25559 Pain in unspecified hip: Secondary | ICD-10-CM | POA: Insufficient documentation

## 2018-11-28 DIAGNOSIS — Z79818 Long term (current) use of other agents affecting estrogen receptors and estrogen levels: Secondary | ICD-10-CM | POA: Insufficient documentation

## 2018-11-28 DIAGNOSIS — C7951 Secondary malignant neoplasm of bone: Secondary | ICD-10-CM | POA: Diagnosis not present

## 2018-11-28 DIAGNOSIS — Z86718 Personal history of other venous thrombosis and embolism: Secondary | ICD-10-CM | POA: Insufficient documentation

## 2018-11-28 DIAGNOSIS — E039 Hypothyroidism, unspecified: Secondary | ICD-10-CM

## 2018-11-28 DIAGNOSIS — M899 Disorder of bone, unspecified: Secondary | ICD-10-CM

## 2018-11-28 DIAGNOSIS — D6859 Other primary thrombophilia: Secondary | ICD-10-CM

## 2018-11-28 DIAGNOSIS — M84659P Pathological fracture in other disease, hip, unspecified, subsequent encounter for fracture with malunion: Secondary | ICD-10-CM

## 2018-11-28 DIAGNOSIS — M84459A Pathological fracture, hip, unspecified, initial encounter for fracture: Secondary | ICD-10-CM

## 2018-11-28 LAB — PROTIME-INR
INR: 2.7 — ABNORMAL HIGH (ref 0.8–1.2)
Prothrombin Time: 28.1 seconds — ABNORMAL HIGH (ref 11.4–15.2)

## 2018-11-28 LAB — CBC WITH DIFFERENTIAL/PLATELET
Abs Immature Granulocytes: 0.02 10*3/uL (ref 0.00–0.07)
Basophils Absolute: 0.1 10*3/uL (ref 0.0–0.1)
Basophils Relative: 1 %
Eosinophils Absolute: 0.2 10*3/uL (ref 0.0–0.5)
Eosinophils Relative: 3 %
HCT: 41 % (ref 36.0–46.0)
Hemoglobin: 13.1 g/dL (ref 12.0–15.0)
Immature Granulocytes: 0 %
Lymphocytes Relative: 15 %
Lymphs Abs: 1 10*3/uL (ref 0.7–4.0)
MCH: 28.7 pg (ref 26.0–34.0)
MCHC: 32 g/dL (ref 30.0–36.0)
MCV: 89.7 fL (ref 80.0–100.0)
Monocytes Absolute: 0.9 10*3/uL (ref 0.1–1.0)
Monocytes Relative: 14 %
Neutro Abs: 4.7 10*3/uL (ref 1.7–7.7)
Neutrophils Relative %: 67 %
Platelets: 231 10*3/uL (ref 150–400)
RBC: 4.57 MIL/uL (ref 3.87–5.11)
RDW: 15 % (ref 11.5–15.5)
WBC: 6.9 10*3/uL (ref 4.0–10.5)
nRBC: 0 % (ref 0.0–0.2)

## 2018-11-28 LAB — COMPREHENSIVE METABOLIC PANEL
ALT: 15 U/L (ref 0–44)
AST: 14 U/L — ABNORMAL LOW (ref 15–41)
Albumin: 3.7 g/dL (ref 3.5–5.0)
Alkaline Phosphatase: 99 U/L (ref 38–126)
Anion gap: 8 (ref 5–15)
BUN: 15 mg/dL (ref 8–23)
CO2: 28 mmol/L (ref 22–32)
Calcium: 10.5 mg/dL — ABNORMAL HIGH (ref 8.9–10.3)
Chloride: 105 mmol/L (ref 98–111)
Creatinine, Ser: 0.82 mg/dL (ref 0.44–1.00)
GFR calc Af Amer: >60 mL/min (ref >60)
GFR calc non Af Amer: >60 mL/min (ref >60)
Glucose, Bld: 106 mg/dL — ABNORMAL HIGH (ref 70–99)
Potassium: 4.2 mmol/L (ref 3.5–5.1)
Sodium: 141 mmol/L (ref 135–145)
Total Bilirubin: 0.3 mg/dL (ref 0.3–1.2)
Total Protein: 6.9 g/dL (ref 6.5–8.1)

## 2018-11-28 MED ORDER — FULVESTRANT 250 MG/5ML IM SOLN
INTRAMUSCULAR | Status: AC
  Filled 2018-11-28: qty 10

## 2018-11-28 MED ORDER — FULVESTRANT 250 MG/5ML IM SOLN
500.0000 mg | Freq: Once | INTRAMUSCULAR | Status: AC
  Administered 2018-11-28: 12:00:00 500 mg via INTRAMUSCULAR

## 2018-11-28 NOTE — Progress Notes (Signed)
[place]  Telephone:([phone]) [phone] Fax:(336) [phone]     ID: Alicia Vasquez OB: May 04, 1941  MR#: [id number]  NLG#:[id number]  Patient Care Team: Briscoe Deutscher, DO as PCP - General (Family Medicine) Philemon Kingdom, MD as Consulting Physician (Internal Medicine) Caprice Renshaw, MD as Referring Physician (Internal Medicine) Magrinat, [name], MD as Consulting Physician (Oncology) [name], DDS as Consulting Physician (Dentistry) [name], [place] as Consulting Physician (Physician Assistant) [name] Tamala Fothergill, DPM as Consulting Physician (Podiatry) OTHER MD:   CHIEF COMPLAINT: Estrogen receptor positive breast cancer  CURRENT TREATMENT:  Fulvestrant; warfarin   INTERVAL HISTORY: Alicia Vasquez is here today for follow up and treatment of her metastatic estrogen receptor positive breast cancer.  Alicia Vasquez receives fulvestrant every 4 weeks, and is tolerating it well.    She is taking Warfarin.  INRs have been stable.  Alicia Vasquez denies any easy bruising or bleeding.   REVIEW OF SYSTEMS: Alicia Vasquez notes an increase in her right axilla swelling.  She has stable pain in her hip that is controlled with Norco.  PMP aware reviewed, last fill on 11/01/2018.  Alicia Vasquez denies any fever or chills.  She is without any chest pain or palpitations.  She notes a mild numbness in her right first three digits that started when she burned her hand in December.  She also notes that she sleeps mainly on her right side due to pain in her left hip.  Alicia Vasquez notes that she recently had an infection in her toe that had to have intervention by Dr. [name].  She wants to know if she can have any upcoming procedure on her toes if needed.    Alicia Vasquez continues to live with her friend Alicia Vasquez who helps take care of her. She is keeping appropriate pandemic precautions.  She is feeling well and a detailed ROS was otherwise non contributory today.    BREAST CANCER HISTORY: From doctor [name]'s intake node  07/24/2013:  "78 y.o. female. Who presented with SOB and had a CT chest performed that revealed a right breast mass. Mammogram/ultrsound on 2/13 showed a mass in the 11 o'clock position in the right breast measuring 1.5 cm. Also noted was a right axillary LN measuring 1.6 cm. MRI not performed. Biopsy of mass and lymph done. 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 Lymph node + for metastatic carcinoma."  [On 09/09/2013 the patient underwent right lumpectomy and sentinel lymph node sampling. This showed ([id number]) multifocal invasive ductal carcinoma, grade 1, the largest lesion measuring 1.8 cm, the second lesion 1.2 cm. One of 4 sentinel lymph nodes was positive, with extracapsular extension. Margins were positive. HER-2 was repeated and was again negative. Further surgery 09/16/2013 obtained clear margins.  Her subsequent history is as detailed below   PAST MEDICAL HISTORY: Past Medical History:  Diagnosis Date  . Allergy   . Anxiety   . Arthritis   . Blood transfusion without reported diagnosis   . Breast cancer ([place]) 07/12/2013   Invasive Mammary Carcinoma  . DVT (deep vein thrombosis) in pregnancy   . Hypertension   . Hypothyroid   . Personal history of radiation therapy   . Pneumonia   . Radiation 11/21/13-01/07/14   Right Breast/Supraclavicular    PAST SURGICAL HISTORY: Past Surgical History:  Procedure Laterality Date  . BREAST LUMPECTOMY Left 2015  . BREAST LUMPECTOMY WITH RADIOACTIVE SEED LOCALIZATION Right 09/09/2013   Procedure:  BREAST LUMPECTOMY WITH RADIOACTIVE SEED LOCALIZATION WITH AXILLARY NODE EXCISION;  Surgeon: [name], MD;  Location: [facility];  Service: General;  Laterality: Right;  . DENTAL SURGERY  04/19/2012   13 TEETH REMOVED  . DILATION AND CURETTAGE OF UTERUS    . ORIF PERIPROSTHETIC FRACTURE Left 01/31/2017    Procedure: REVISION and OPEN REDUCTION INTERNAL FIXATION (ORIF) PERIPROSTHETIC FRACTURE LEFT HIP;  Surgeon: Paralee Cancel, MD;  Location: [facility];  Service: Orthopedics;  Laterality: Left;  120 mins  . RE-EXCISION OF BREAST LUMPECTOMY Right 09/24/2013   Procedure: RE-EXCISION OF RIGHT BREAST LUMPECTOMY;  Surgeon: [name], MD;  Location: [facility];  Service: General;  Laterality: Right;  . TOTAL HIP ARTHROPLASTY Left 01/16/2017   Procedure: TOTAL HIP ARTHROPLASTY POSTERIOR;  Surgeon: Paralee Cancel, MD;  Location: [facility];  Service: Orthopedics;  Laterality: Left;    FAMILY HISTORY Family History  Problem Relation Age of Onset  . Heart disease Brother   . Colon cancer Brother   . Prostate cancer Brother    the patient's father died at the age of 9 after an automobile accident. The patient's mother died at the age of 31. She was a Marine scientist here in [name] in the old [facility]. She was infected with polio and was confined to a wheelchair for a good part of her life. She eventually died of pneumonia. The patient had one brother, who died with prostate cancer. She had no sisters. There is no history of breast or ovarian cancer in the family.   GYNECOLOGIC HISTORY:  Menarche age 14, first live birth age 74, the patient is GX P1. She went through the change of life at age 89. 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 She is widowed. Currently she is staying with her friend Alicia Vasquez,  48, who is a retired Radio producer. The patient's son Alicia Vasquez lives in [place]. He works an Engineer, technical sales. The patient has no grandchildren. She is a Tourist information centre manager but currently attends a [employer] with her friend Alicia Vasquez   ADVANCED DIRECTIVES: Not in place   HEALTH MAINTENANCE: Social History   Tobacco Use  . Smoking status: Never Smoker  . Smokeless tobacco: Never Used  Substance Use Topics  .  Alcohol use: No    Alcohol/week: 0.0 standard drinks  . Drug use: No     Colonoscopy: Never  PAP:  Bone density: 09/20/2016 showed a T score of -2.2  Lipid panel:  Allergies  Allergen Reactions  . Anesthetics, Amide Hypertension  . Benadryl [Diphenhydramine Hcl] Other (See Comments)    Dizziness  . Carbocaine [Mepivacaine Hcl] Hypertension  . Codeine Other (See Comments)    Dizziness  . Epinephrine Hypertension  . Sulfa Antibiotics Other (See Comments)    dizziness  . Latex Other (See Comments)    Blisters in mouth  . Tramadol     Sedation.   Marland Kitchen Penicillins Rash    Has patient had a PCN reaction causing immediate rash, facial/tongue/throat swelling, SOB or lightheadedness with hypotension: Unknown Has patient had a PCN reaction causing severe rash involving mucus membranes or skin necrosis: Unknown Has patient had a PCN reaction that required hospitalization: Unknown Has patient had a PCN reaction occurring within the last 10 years: Unknown If all of the above answers are "NO", then may proceed with Cephalosporin use.     Current Outpatient Medications  Medication Sig Dispense  Refill  . Cholecalciferol (VITAMIN D3) 5000 UNITS TABS Take 5,000 Units by mouth daily.     Marland Kitchen HYDROcodone-acetaminophen (NORCO/VICODIN) 5-325 MG tablet Take 1 tablet by mouth every 6 (six) hours as needed for moderate pain. 120 tablet 0  . SYNTHROID 125 MCG tablet TAKE 1 TABLET BY MOUTH EVERY DAY BEFORE BREAKFAST 30 tablet 2  . vitamin C (ASCORBIC ACID) 250 MG tablet Take 250 mg by mouth 3 (three) times daily.    Marland Kitchen warfarin (COUMADIN) 5 MG tablet TAKE 1.5-2 TABLETS ONE TIME ONLY AT 6 PM FOR 1 DOSE. TAKE 7.5 MG ALTERNATING WITH 10 MG DAILY 270 tablet 1   No current facility-administered medications for this visit.    Facility-Administered Medications Ordered in Other Visits  Medication Dose Route Frequency Provider Last Rate Last Dose  . fulvestrant (FASLODEX) injection 500 mg  500 mg Intramuscular  Once Magrinat, [name], MD        OBJECTIVE:  Vitals:   11/28/18 1054  BP: (!) 160/72  Pulse: 75  Resp: 18  Temp: 99.1 F (37.3 C)  SpO2: 100%     Body mass index is 40.11 kg/m.    ECOG FS: 2 - Symptomatic, <50% confined to bed GENERAL: Patient is a well appearing female in no acute distress HEENT:  Sclerae anicteric.  Oropharynx clear and moist. No ulcerations or evidence of oropharyngeal candidiasis. Neck is supple.  NODES: Fullness noted in right axilla.   BREAST EXAM:  Small 1cm area of skin thickening over lumpectomy site on right breast, left breast not examined LUNGS:  Clear to auscultation bilaterally.  No wheezes or rhonchi. HEART:  Regular rate and rhythm. No murmur appreciated. ABDOMEN:  Soft, nontender.  Positive, normoactive bowel sounds. No organomegaly palpated. MSK:  No focal spinal tenderness to palpation. Full range of motion bilaterally in the upper extremities. EXTREMITIES:  No peripheral edema.   SKIN:  Clear with no obvious rashes or skin changes. No nail dyscrasia. NEURO:  Nonfocal. Well oriented.  Appropriate affect.     LAB RESULTS:  CMP     Component Value Date/Time   NA 141 11/28/2018 1044   NA 141 05/16/2017 1416   K 4.2 11/28/2018 1044   K 3.8 05/16/2017 1416   CL 105 11/28/2018 1044   CO2 28 11/28/2018 1044   CO2 27 05/16/2017 1416   GLUCOSE 106 (H) 11/28/2018 1044   GLUCOSE 140 05/16/2017 1416   BUN 15 11/28/2018 1044   BUN 13.2 05/16/2017 1416   CREATININE 0.82 11/28/2018 1044   CREATININE 0.77 03/21/2018 1417   CREATININE 0.80 09/22/2017 1555   CREATININE 0.8 05/16/2017 1416   CALCIUM 10.5 (H) 11/28/2018 1044   CALCIUM 11.0 (H) 05/16/2017 1416   PROT 6.9 11/28/2018 1044   PROT 6.9 05/16/2017 1416   ALBUMIN 3.7 11/28/2018 1044   ALBUMIN 3.6 05/16/2017 1416   AST 14 (L) 11/28/2018 1044   AST 14 (L) 03/21/2018 1417   AST 13 05/16/2017 1416   ALT 15 11/28/2018 1044   ALT 14 03/21/2018 1417   ALT <6 05/16/2017 1416   ALKPHOS 99  11/28/2018 1044   ALKPHOS 130 05/16/2017 1416   BILITOT 0.3 11/28/2018 1044   BILITOT 0.3 03/21/2018 1417   BILITOT 0.31 05/16/2017 1416   GFRNONAA >60 11/28/2018 1044   GFRNONAA >60 03/21/2018 1417   GFRNONAA 75 08/19/2015 1602   GFRAA >60 11/28/2018 1044   GFRAA >60 03/21/2018 1417   GFRAA 87 08/19/2015 1602    I No results  found for: SPEP  Lab Results  Component Value Date   WBC 6.9 11/28/2018   NEUTROABS 4.7 11/28/2018   HGB 13.1 11/28/2018   HCT 41.0 11/28/2018   MCV 89.7 11/28/2018   PLT 231 11/28/2018      Chemistry      Component Value Date/Time   NA 141 11/28/2018 1044   NA 141 05/16/2017 1416   K 4.2 11/28/2018 1044   K 3.8 05/16/2017 1416   CL 105 11/28/2018 1044   CO2 28 11/28/2018 1044   CO2 27 05/16/2017 1416   BUN 15 11/28/2018 1044   BUN 13.2 05/16/2017 1416   CREATININE 0.82 11/28/2018 1044   CREATININE 0.77 03/21/2018 1417   CREATININE 0.80 09/22/2017 1555   CREATININE 0.8 05/16/2017 1416      Component Value Date/Time   CALCIUM 10.5 (H) 11/28/2018 1044   CALCIUM 11.0 (H) 05/16/2017 1416   ALKPHOS 99 11/28/2018 1044   ALKPHOS 130 05/16/2017 1416   AST 14 (L) 11/28/2018 1044   AST 14 (L) 03/21/2018 1417   AST 13 05/16/2017 1416   ALT 15 11/28/2018 1044   ALT 14 03/21/2018 1417   ALT <6 05/16/2017 1416   BILITOT 0.3 11/28/2018 1044   BILITOT 0.3 03/21/2018 1417   BILITOT 0.31 05/16/2017 1416       No results found for: LABCA2  No components found for: LABCA125  Recent Labs  Lab 11/28/18 1044  INR 2.7*    Urinalysis    Component Value Date/Time   COLORURINE STRAW (A) 02/20/2018 1606   APPEARANCEUR CLEAR 02/20/2018 1606   LABSPEC 1.009 02/20/2018 1606   LABSPEC 1.010 09/22/2016 1553   PHURINE 6.0 02/20/2018 1606   GLUCOSEU NEGATIVE 02/20/2018 1606   GLUCOSEU Negative 09/22/2016 1553   HGBUR NEGATIVE 02/20/2018 1606   BILIRUBINUR Negative 03/16/2018 1433   BILIRUBINUR Negative 09/22/2016 1553   KETONESUR NEGATIVE  02/20/2018 1606   PROTEINUR Negative 03/16/2018 1433   PROTEINUR NEGATIVE 02/20/2018 1606   UROBILINOGEN 0.2 03/16/2018 1433   UROBILINOGEN 0.2 11/15/2016 1358   UROBILINOGEN 0.2 09/22/2016 1553   NITRITE Negative 03/16/2018 1433   NITRITE NEGATIVE 02/20/2018 1606   LEUKOCYTESUR Negative 03/16/2018 1433   LEUKOCYTESUR Negative 09/22/2016 1553    STUDIES: Restaging studies scheduled for later this month  ASSESSMENT: 78 y.o. [place] woman status post right breast upper outer quadrant lumpectomy and sentinel lymph node sampling 09/09/2013 for an mpT1c pN1a, stage IIA invasive ductal carcinoma, estrogen and progesterone receptor both 100% positive with strong staining intensity, MIB-1 of 17% and no HER-2 amplification  (1) additional surgery for margin clearance 09/16/2013 obtained negative margins  (2) Oncotype DX recurrence score of 4 predicts a risk of outside the breast recurrence within 10 years of 7% if the patient's only systemic therapy is tamoxifen for 5 years. It also predicts no benefit from chemotherapy  (3) adjuvant radiation completed 01/07/2014  (4) anastrozole started 02/27/2014 stopped within 2 weeks because of arm swelling.   (a) bone density April 2016 showed osteopenia, with a t-score of -1.6  (b) anastrozole resumed 12/17/2015  (c) Bone density 09/20/2016 showed a T score of -2.2  (5) history of left lower extremity DVT 11/23/2012, initially on rivaroxaban, which caused chest pain, switch to Coumadin July 2014  (b) coumadin dose increased to 7.5/10 mg alternating days as of 06/13/2017   METASTATIC DISEASE: August 2018 (6) status post left total hip replacement 01/16/2017 for estrogen receptor positive adenocarcinoma.  (a) CA 27-29 was 46.4 as of 04/18/2017  (  b) chest CT scan 05/02/2017 shows no lung or liver lesion concern; it does show aortic atherosclerosis  (c) baseline bone scan 05/02/2017 was negative  (d) PET scan 09/12/2017 shows no active disease,  including bone  (e) PET scan and CT chest on 05/29/2018 show no active disease  (7) fulvestrant started 04/18/2017  (8) denosumab/Xgeva started 05/16/2017  (a) changed to every 12-weeks after 10/03/2017 dose   (b) held starting with 06/13/2018 dose due to dental concerns  PLAN:  Alicia Vasquez is doing well today.  Clinically she appears well and continues to tolerate the Fulvestrant well with no signs of progression on scans in December.  At this point, I recommended repeat scans.  Due to her symptoms, we will get CT chest/abdomen/pelvis and bone scan.  Most recently she had no evidence of disease.  Her mammogram is overdue and so I ordered diagnostic mammogram.  I also ordered ultrasound of the right breast to evaluate the fullness in her axilla and area of concern at her lumpectomy site.  She will continue on Norco as prescribed for her pain control.  PMP aware was reviewed today.  Fills have been appropriate.  Alicia Vasquez will return in 4 weeks for labs, f/u, and her next injection.  She was recommended to keep appropriate pandemic precautions.  She knows to call for any other issue that may develop before that visit.  A total of (30) minutes of face-to-face time was spent with this patient with greater than 50% of that time in counseling and care-coordination.   Wilber Bihari, NP 11/28/18 11:51 AM Medical Oncology and Hematology [facility] [address] [place], [place] [address] Tel. [phone]    Fax. [phone]

## 2018-11-28 NOTE — Patient Instructions (Signed)

## 2018-11-29 ENCOUNTER — Telehealth: Payer: Self-pay | Admitting: Adult Health

## 2018-11-29 ENCOUNTER — Other Ambulatory Visit: Payer: Self-pay | Admitting: Oncology

## 2018-11-29 DIAGNOSIS — E039 Hypothyroidism, unspecified: Secondary | ICD-10-CM

## 2018-11-29 LAB — CANCER ANTIGEN 27.29: CA 27.29: 42.1 U/mL — ABNORMAL HIGH (ref 0.0–38.6)

## 2018-11-29 NOTE — Telephone Encounter (Signed)
I talk with patient regarding schedule and she requested 7/30 for a later time

## 2018-12-06 ENCOUNTER — Other Ambulatory Visit: Payer: Self-pay

## 2018-12-06 ENCOUNTER — Encounter: Payer: Self-pay | Admitting: Podiatry

## 2018-12-06 ENCOUNTER — Other Ambulatory Visit: Payer: Self-pay | Admitting: *Deleted

## 2018-12-06 ENCOUNTER — Ambulatory Visit (INDEPENDENT_AMBULATORY_CARE_PROVIDER_SITE_OTHER): Payer: Medicare Other | Admitting: Podiatry

## 2018-12-06 VITALS — Temp 98.2°F

## 2018-12-06 DIAGNOSIS — L03031 Cellulitis of right toe: Secondary | ICD-10-CM

## 2018-12-06 DIAGNOSIS — M79674 Pain in right toe(s): Secondary | ICD-10-CM

## 2018-12-06 DIAGNOSIS — M79675 Pain in left toe(s): Secondary | ICD-10-CM | POA: Diagnosis not present

## 2018-12-06 DIAGNOSIS — B351 Tinea unguium: Secondary | ICD-10-CM | POA: Diagnosis not present

## 2018-12-06 NOTE — Patient Instructions (Signed)

## 2018-12-11 ENCOUNTER — Other Ambulatory Visit: Payer: Self-pay | Admitting: Oncology

## 2018-12-11 DIAGNOSIS — M84559P Pathological fracture in neoplastic disease, hip, unspecified, subsequent encounter for fracture with malunion: Secondary | ICD-10-CM

## 2018-12-11 MED ORDER — HYDROCODONE-ACETAMINOPHEN 5-325 MG PO TABS: 1.0000 | ORAL_TABLET | Freq: Four times a day (QID) | ORAL | 0 refills | Status: DC | PRN

## 2018-12-11 NOTE — Progress Notes (Signed)
Subjective:   Patient ID: Alicia Vasquez, female   DOB: 78 y.o.   MRN: [id number]   HPI Patient states all my nails are thickened and the left big toenail has become red in the corner and has drainage and I just finished antibiotics and it still sore.  All my nails get sore and I cannot cut them myself and patient does not currently smoke and does like to be active if possible   Review of Systems  All other systems reviewed and are negative.       Objective:  Physical Exam Vitals signs and nursing note reviewed.  Constitutional:      Appearance: She is well-developed.  Pulmonary:     Effort: Pulmonary effort is normal.  Musculoskeletal: Normal range of motion.  Skin:    General: Skin is warm.  Neurological:     Mental Status: She is alert.     Neurovascular status intact muscle strength found to be adequate range of motion was moderately reduced subtalar midtarsal joint.  Patient is found to have an incurvated left hallux nail medial lateral borders with redness and drainage that is moderately painful and localized with no proximal edema erythema drainage noted.  Patient is found to have thick yellow brittle nailbeds 1-5 both feet that are painful and impossible to cut     Assessment:  Paronychia infection of the left hallux medial lateral borders along with mycotic nail infection 1-5 both feet with symptomatic pain      Plan:  H&P and condition reviewed and at this point I have recommended removal of the nail borders and I anesthetized the left hallux 60 mg like Marcaine mixture which was tolerated well sterile prep applied removed borders removed all proud flesh abscess tissue and allow channels for drainage and then debrided nailbeds 1-5 both feet with no iatrogenic bleeding.  Reappoint for [name] to recheck signed visit

## 2018-12-13 ENCOUNTER — Other Ambulatory Visit: Payer: Medicare Other

## 2018-12-24 ENCOUNTER — Other Ambulatory Visit: Payer: Medicare Other

## 2018-12-26 ENCOUNTER — Ambulatory Visit: Payer: Medicare Other

## 2018-12-26 ENCOUNTER — Other Ambulatory Visit: Payer: Medicare Other

## 2018-12-26 ENCOUNTER — Ambulatory Visit: Payer: Medicare Other | Admitting: Adult Health

## 2018-12-27 ENCOUNTER — Other Ambulatory Visit: Payer: Self-pay

## 2018-12-27 ENCOUNTER — Inpatient Hospital Stay: Payer: Medicare Other

## 2018-12-27 ENCOUNTER — Encounter: Payer: Self-pay | Admitting: Adult Health

## 2018-12-27 ENCOUNTER — Inpatient Hospital Stay (HOSPITAL_BASED_OUTPATIENT_CLINIC_OR_DEPARTMENT_OTHER): Payer: Medicare Other | Admitting: Adult Health

## 2018-12-27 VITALS — BP 140/63 | HR 79 | Temp 98.7°F | Resp 18 | Ht 64.0 in | Wt 234.4 lb

## 2018-12-27 DIAGNOSIS — R600 Localized edema: Secondary | ICD-10-CM | POA: Diagnosis not present

## 2018-12-27 DIAGNOSIS — G893 Neoplasm related pain (acute) (chronic): Secondary | ICD-10-CM

## 2018-12-27 DIAGNOSIS — Z170 Estrogen receptor positive status [ER+]: Secondary | ICD-10-CM | POA: Diagnosis not present

## 2018-12-27 DIAGNOSIS — M1990 Unspecified osteoarthritis, unspecified site: Secondary | ICD-10-CM | POA: Diagnosis not present

## 2018-12-27 DIAGNOSIS — Z79818 Long term (current) use of other agents affecting estrogen receptors and estrogen levels: Secondary | ICD-10-CM

## 2018-12-27 DIAGNOSIS — C50411 Malignant neoplasm of upper-outer quadrant of right female breast: Secondary | ICD-10-CM

## 2018-12-27 DIAGNOSIS — C7951 Secondary malignant neoplasm of bone: Secondary | ICD-10-CM

## 2018-12-27 DIAGNOSIS — M899 Disorder of bone, unspecified: Secondary | ICD-10-CM

## 2018-12-27 DIAGNOSIS — Z79899 Other long term (current) drug therapy: Secondary | ICD-10-CM | POA: Diagnosis not present

## 2018-12-27 DIAGNOSIS — M25559 Pain in unspecified hip: Secondary | ICD-10-CM

## 2018-12-27 DIAGNOSIS — M84459A Pathological fracture, hip, unspecified, initial encounter for fracture: Secondary | ICD-10-CM

## 2018-12-27 DIAGNOSIS — I10 Essential (primary) hypertension: Secondary | ICD-10-CM | POA: Diagnosis not present

## 2018-12-27 DIAGNOSIS — M8580 Other specified disorders of bone density and structure, unspecified site: Secondary | ICD-10-CM

## 2018-12-27 DIAGNOSIS — M84659P Pathological fracture in other disease, hip, unspecified, subsequent encounter for fracture with malunion: Secondary | ICD-10-CM

## 2018-12-27 DIAGNOSIS — I700 Atherosclerosis of aorta: Secondary | ICD-10-CM

## 2018-12-27 DIAGNOSIS — Z7901 Long term (current) use of anticoagulants: Secondary | ICD-10-CM

## 2018-12-27 DIAGNOSIS — Z923 Personal history of irradiation: Secondary | ICD-10-CM | POA: Diagnosis not present

## 2018-12-27 DIAGNOSIS — I82401 Acute embolism and thrombosis of unspecified deep veins of right lower extremity: Secondary | ICD-10-CM

## 2018-12-27 DIAGNOSIS — E039 Hypothyroidism, unspecified: Secondary | ICD-10-CM | POA: Diagnosis not present

## 2018-12-27 DIAGNOSIS — Z86718 Personal history of other venous thrombosis and embolism: Secondary | ICD-10-CM

## 2018-12-27 DIAGNOSIS — D6859 Other primary thrombophilia: Secondary | ICD-10-CM

## 2018-12-27 LAB — CBC WITH DIFFERENTIAL/PLATELET
Abs Immature Granulocytes: 0.03 10*3/uL (ref 0.00–0.07)
Basophils Absolute: 0.1 10*3/uL (ref 0.0–0.1)
Basophils Relative: 1 %
Eosinophils Absolute: 0.2 10*3/uL (ref 0.0–0.5)
Eosinophils Relative: 3 %
HCT: 40.2 % (ref 36.0–46.0)
Hemoglobin: 12.8 g/dL (ref 12.0–15.0)
Immature Granulocytes: 0 %
Lymphocytes Relative: 16 %
Lymphs Abs: 1.1 10*3/uL (ref 0.7–4.0)
MCH: 28.7 pg (ref 26.0–34.0)
MCHC: 31.8 g/dL (ref 30.0–36.0)
MCV: 90.1 fL (ref 80.0–100.0)
Monocytes Absolute: 0.9 10*3/uL (ref 0.1–1.0)
Monocytes Relative: 12 %
Neutro Abs: 4.8 10*3/uL (ref 1.7–7.7)
Neutrophils Relative %: 68 %
Platelets: 236 10*3/uL (ref 150–400)
RBC: 4.46 MIL/uL (ref 3.87–5.11)
RDW: 14.6 % (ref 11.5–15.5)
WBC: 7 10*3/uL (ref 4.0–10.5)
nRBC: 0 % (ref 0.0–0.2)

## 2018-12-27 LAB — COMPREHENSIVE METABOLIC PANEL
ALT: 16 U/L (ref 0–44)
AST: 14 U/L — ABNORMAL LOW (ref 15–41)
Albumin: 3.7 g/dL (ref 3.5–5.0)
Alkaline Phosphatase: 124 U/L (ref 38–126)
Anion gap: 8 (ref 5–15)
BUN: 15 mg/dL (ref 8–23)
CO2: 26 mmol/L (ref 22–32)
Calcium: 10.8 mg/dL — ABNORMAL HIGH (ref 8.9–10.3)
Chloride: 106 mmol/L (ref 98–111)
Creatinine, Ser: 0.82 mg/dL (ref 0.44–1.00)
GFR calc Af Amer: >60 mL/min (ref >60)
GFR calc non Af Amer: >60 mL/min (ref >60)
Glucose, Bld: 116 mg/dL — ABNORMAL HIGH (ref 70–99)
Potassium: 4 mmol/L (ref 3.5–5.1)
Sodium: 140 mmol/L (ref 135–145)
Total Bilirubin: 0.3 mg/dL (ref 0.3–1.2)
Total Protein: 6.8 g/dL (ref 6.5–8.1)

## 2018-12-27 LAB — PROTIME-INR
INR: 2.9 — ABNORMAL HIGH (ref 0.8–1.2)
Prothrombin Time: 29.8 seconds — ABNORMAL HIGH (ref 11.4–15.2)

## 2018-12-27 MED ORDER — KETOCONAZOLE 2 % EX CREA: 1.0000 "application " | TOPICAL_CREAM | Freq: Every day | CUTANEOUS | 0 refills | Status: DC

## 2018-12-27 MED ORDER — FULVESTRANT 250 MG/5ML IM SOLN
500.0000 mg | Freq: Once | INTRAMUSCULAR | Status: AC
  Administered 2018-12-27: 14:00:00 500 mg via INTRAMUSCULAR

## 2018-12-27 NOTE — Progress Notes (Signed)
Alicia Vasquez  [phone] Fax:([phone]) [phone]     ID: Martiza Speth Mcglone OB: [birth year]  MR#: [id number]  XBM#:[id number]  Patient Care Team: Briscoe Deutscher, DO as PCP - General (Family Medicine) Philemon Kingdom, MD as Consulting Physician (Internal Medicine) Caprice Renshaw, MD as Referring Physician (Internal Medicine) Magrinat, [name], MD as Consulting Physician (Oncology) [name], DDS as Consulting Physician (Dentistry) [name], [place] as Consulting Physician (Physician Assistant) [name] Tamala Fothergill, DPM as Consulting Physician (Podiatry) OTHER MD:   CHIEF COMPLAINT: Estrogen receptor positive breast cancer  CURRENT TREATMENT:  Fulvestrant; warfarin   INTERVAL HISTORY: [name] is here today for follow up and treatment of her metastatic estrogen receptor positive breast cancer.  Alicia Vasquez receives fulvestrant every 4 weeks, and is tolerating it well.    She is taking Warfarin.  INRs have been stable.  Eithel denies any easy bruising or bleeding.   REVIEW OF SYSTEMS: Briannia notes that her left great toe is still painful where she had the nail procedure by Dr. [name].  She is still recovering.  She wants to know if her treatment will interfere with the procedure.  [name] notes continued tenderness in her right upper axillary tail.  She has a mammogram and ultrasound scheduled for this tomorrow.  [name] is also overdue for her restaging scans.  She notes that she was called about scheduling these, however has not called back to get these scheduled.  [name] continues to take Norco for pain and her pain is under control.  She continues to live at home with her friend Alicia Vasquez who helps take care of her.  She is otherwise doing well and a detailed ROS was otherwise non contributory.    BREAST CANCER HISTORY: From doctor [name]'s intake node 07/24/2013:  "78 y.o. female. Who presented with SOB and had a CT chest performed that revealed a right breast mass.  Mammogram/ultrsound on 2/13 showed a mass in the 11 o'clock position in the right breast measuring 1.5 cm. Also noted was a right axillary LN measuring 1.6 cm. MRI not performed. Biopsy of mass and lymph done. 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 Lymph node + for metastatic carcinoma."  [On 09/09/2013 the patient underwent right lumpectomy and sentinel lymph node sampling. This showed ([id number]) multifocal invasive ductal carcinoma, grade 1, the largest lesion measuring 1.8 cm, the second lesion 1.2 cm. One of 4 sentinel lymph nodes was positive, with extracapsular extension. Margins were positive. HER-2 was repeated and was again negative. Further surgery 09/16/2013 obtained clear margins.  Her subsequent history is as detailed below   PAST MEDICAL HISTORY: Past Medical History:  Diagnosis Date  . Allergy   . Anxiety   . Arthritis   . Blood transfusion without reported diagnosis   . Breast cancer ([place]) 07/12/2013   Invasive Mammary Carcinoma  . DVT (deep vein thrombosis) in pregnancy   . Hypertension   . Hypothyroid   . Personal history of radiation therapy   . Pneumonia   . Radiation 11/21/13-01/07/14   Right Breast/Supraclavicular    PAST SURGICAL HISTORY: Past Surgical History:  Procedure Laterality Date  . BREAST LUMPECTOMY Left 2015  . BREAST LUMPECTOMY WITH RADIOACTIVE SEED LOCALIZATION Right 09/09/2013   Procedure: BREAST LUMPECTOMY WITH RADIOACTIVE SEED LOCALIZATION WITH AXILLARY NODE EXCISION;  Surgeon: [name], MD;  Location: [facility];  Service: General;  Laterality: Right;  .  DENTAL SURGERY  04/19/2012   13 TEETH REMOVED  . DILATION AND CURETTAGE OF UTERUS    . ORIF PERIPROSTHETIC FRACTURE Left 01/31/2017   Procedure: REVISION and OPEN REDUCTION INTERNAL FIXATION (ORIF) PERIPROSTHETIC FRACTURE LEFT HIP;  Surgeon: Paralee Cancel, MD;   Location: [facility];  Service: Orthopedics;  Laterality: Left;  120 mins  . RE-EXCISION OF BREAST LUMPECTOMY Right 09/24/2013   Procedure: RE-EXCISION OF RIGHT BREAST LUMPECTOMY;  Surgeon: [name], MD;  Location: [facility];  Service: General;  Laterality: Right;  . TOTAL HIP ARTHROPLASTY Left 01/16/2017   Procedure: TOTAL HIP ARTHROPLASTY POSTERIOR;  Surgeon: Paralee Cancel, MD;  Location: [facility];  Service: Orthopedics;  Laterality: Left;    FAMILY HISTORY Family History  Problem Relation Age of Onset  . Heart disease Brother   . Colon cancer Brother   . Prostate cancer Brother    the patient's father died at the age of 57 after an automobile accident. The patient's mother died at the age of 76. She was a Marine scientist here in [name] in the old [facility]. She was infected with polio and was confined to a wheelchair for a good part of her life. She eventually died of pneumonia. The patient had one brother, who died with prostate cancer. She had no sisters. There is no history of breast or ovarian cancer in the family.   GYNECOLOGIC HISTORY:  Menarche age 5, first live birth age 64, the patient is GX P1. She went through the change of life at age 45. 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 She is widowed. Currently she is staying with her friend [name],  43, who is a retired Radio producer. The patient's son Alicia Vasquez lives in [place]. He works an Engineer, technical sales. The patient has no grandchildren. She is a Tourist information centre manager but currently attends a [employer] with her friend Alicia Vasquez   ADVANCED DIRECTIVES: Not in place   HEALTH MAINTENANCE: Social History   Tobacco Use  . Smoking status: Never Smoker  . Smokeless tobacco: Never Used  Substance Use Topics  . Alcohol use: No    Alcohol/week: 0.0 standard drinks  . Drug use: No     Colonoscopy: Never  PAP:  Bone density: 09/20/2016  showed a T score of -2.2  Lipid panel:  Allergies  Allergen Reactions  . Anesthetics, Amide Hypertension  . Benadryl [Diphenhydramine Hcl] Other (See Comments)    Dizziness  . Carbocaine [Mepivacaine Hcl] Hypertension  . Codeine Other (See Comments)    Dizziness  . Epinephrine Hypertension  . Sulfa Antibiotics Other (See Comments)    dizziness  . Latex Other (See Comments)    Blisters in mouth  . Tramadol     Sedation.   Marland Kitchen Penicillins Rash    Has patient had a PCN reaction causing immediate rash, facial/tongue/throat swelling, SOB or lightheadedness with hypotension: Unknown Has patient had a PCN reaction causing severe rash involving mucus membranes or skin necrosis: Unknown Has patient had a PCN reaction that required hospitalization: Unknown Has patient had a PCN reaction occurring within the last 10 years: Unknown If all of the above answers are "NO", then may proceed with Cephalosporin use.     Current Outpatient Medications  Medication Sig Dispense Refill  . Cholecalciferol (VITAMIN D3) 5000 UNITS TABS Take 5,000 Units by mouth daily.     Marland Kitchen HYDROcodone-acetaminophen (NORCO/VICODIN) 5-325 MG tablet Take 1 tablet by  mouth every 6 (six) hours as needed for moderate pain. 120 tablet 0  . SYNTHROID 125 MCG tablet TAKE 1 TABLET BY MOUTH EVERY DAY BEFORE BREAKFAST 30 tablet 2  . vitamin C (ASCORBIC ACID) 250 MG tablet Take 250 mg by mouth 3 (three) times daily.    Marland Kitchen warfarin (COUMADIN) 5 MG tablet TAKE 1.5-2 TABLETS ONE TIME ONLY AT 6 PM FOR 1 DOSE. TAKE 7.5 MG ALTERNATING WITH 10 MG DAILY 270 tablet 1   No current facility-administered medications for this visit.     OBJECTIVE:  Vitals:   12/27/18 1259  BP: 140/63  Pulse: 79  Resp: 18  Temp: 98.7 F (37.1 C)  SpO2: 94%     Body mass index is 40.23 kg/m.    ECOG FS: 2 - Symptomatic, <50% confined to bed GENERAL: Patient is a well appearing female in no acute distress HEENT:  Sclerae anicteric.  Oropharynx clear and  moist. No ulcerations or evidence of oropharyngeal candidiasis. Neck is supple.  NODES: Fullness noted in right axilla.   BREAST EXAM:  Small 1cm area of skin thickening over lumpectomy site on right breast, left breast not examined, erythema underneath right breast noted LUNGS:  Clear to auscultation bilaterally.  No wheezes or rhonchi. HEART:  Regular rate and rhythm. No murmur appreciated. ABDOMEN:  Soft, nontender.  Positive, normoactive bowel sounds. No organomegaly palpated. MSK:  No focal spinal tenderness to palpation. Full range of motion bilaterally in the upper extremities. EXTREMITIES:  No peripheral edema.   SKIN:  Clear with no obvious rashes or skin changes. No nail dyscrasia. NEURO:  Nonfocal. Well oriented.  Appropriate affect.     LAB RESULTS:  CMP     Component Value Date/Time   NA 141 11/28/2018 1044   NA 141 05/16/2017 1416   K 4.2 11/28/2018 1044   K 3.8 05/16/2017 1416   CL 105 11/28/2018 1044   CO2 28 11/28/2018 1044   CO2 27 05/16/2017 1416   GLUCOSE 106 (H) 11/28/2018 1044   GLUCOSE 140 05/16/2017 1416   BUN 15 11/28/2018 1044   BUN 13.2 05/16/2017 1416   CREATININE 0.82 11/28/2018 1044   CREATININE 0.77 03/21/2018 1417   CREATININE 0.80 09/22/2017 1555   CREATININE 0.8 05/16/2017 1416   CALCIUM 10.5 (H) 11/28/2018 1044   CALCIUM 11.0 (H) 05/16/2017 1416   PROT 6.9 11/28/2018 1044   PROT 6.9 05/16/2017 1416   ALBUMIN 3.7 11/28/2018 1044   ALBUMIN 3.6 05/16/2017 1416   AST 14 (L) 11/28/2018 1044   AST 14 (L) 03/21/2018 1417   AST 13 05/16/2017 1416   ALT 15 11/28/2018 1044   ALT 14 03/21/2018 1417   ALT <6 05/16/2017 1416   ALKPHOS 99 11/28/2018 1044   ALKPHOS 130 05/16/2017 1416   BILITOT 0.3 11/28/2018 1044   BILITOT 0.3 03/21/2018 1417   BILITOT 0.31 05/16/2017 1416   GFRNONAA >60 11/28/2018 1044   GFRNONAA >60 03/21/2018 1417   GFRNONAA 75 08/19/2015 1602   GFRAA >60 11/28/2018 1044   GFRAA >60 03/21/2018 1417   GFRAA 87 08/19/2015  1602    I No results found for: SPEP  Lab Results  Component Value Date   WBC 7.0 12/27/2018   NEUTROABS 4.8 12/27/2018   HGB 12.8 12/27/2018   HCT 40.2 12/27/2018   MCV 90.1 12/27/2018   PLT 236 12/27/2018      Chemistry      Component Value Date/Time   NA 141 11/28/2018 1044   NA  141 05/16/2017 1416   K 4.2 11/28/2018 1044   K 3.8 05/16/2017 1416   CL 105 11/28/2018 1044   CO2 28 11/28/2018 1044   CO2 27 05/16/2017 1416   BUN 15 11/28/2018 1044   BUN 13.2 05/16/2017 1416   CREATININE 0.82 11/28/2018 1044   CREATININE 0.77 03/21/2018 1417   CREATININE 0.80 09/22/2017 1555   CREATININE 0.8 05/16/2017 1416      Component Value Date/Time   CALCIUM 10.5 (H) 11/28/2018 1044   CALCIUM 11.0 (H) 05/16/2017 1416   ALKPHOS 99 11/28/2018 1044   ALKPHOS 130 05/16/2017 1416   AST 14 (L) 11/28/2018 1044   AST 14 (L) 03/21/2018 1417   AST 13 05/16/2017 1416   ALT 15 11/28/2018 1044   ALT 14 03/21/2018 1417   ALT <6 05/16/2017 1416   BILITOT 0.3 11/28/2018 1044   BILITOT 0.3 03/21/2018 1417   BILITOT 0.31 05/16/2017 1416       No results found for: LABCA2  No components found for: LABCA125  Recent Labs  Lab 12/27/18 1221  INR 2.9*    Urinalysis    Component Value Date/Time   COLORURINE STRAW (A) 02/20/2018 1606   APPEARANCEUR CLEAR 02/20/2018 1606   LABSPEC 1.009 02/20/2018 1606   LABSPEC 1.010 09/22/2016 1553   PHURINE 6.0 02/20/2018 1606   GLUCOSEU NEGATIVE 02/20/2018 1606   GLUCOSEU Negative 09/22/2016 1553   HGBUR NEGATIVE 02/20/2018 1606   BILIRUBINUR Negative 03/16/2018 1433   BILIRUBINUR Negative 09/22/2016 1553   KETONESUR NEGATIVE 02/20/2018 1606   PROTEINUR Negative 03/16/2018 1433   PROTEINUR NEGATIVE 02/20/2018 1606   UROBILINOGEN 0.2 03/16/2018 1433   UROBILINOGEN 0.2 11/15/2016 1358   UROBILINOGEN 0.2 09/22/2016 1553   NITRITE Negative 03/16/2018 1433   NITRITE NEGATIVE 02/20/2018 1606   LEUKOCYTESUR Negative 03/16/2018 1433    LEUKOCYTESUR Negative 09/22/2016 1553    STUDIES: Restaging studies scheduled for later this month  ASSESSMENT: 78 y.o. [place] woman status post right breast upper outer quadrant lumpectomy and sentinel lymph node sampling 09/09/2013 for an mpT1c pN1a, stage IIA invasive ductal carcinoma, estrogen and progesterone receptor both 100% positive with strong staining intensity, MIB-1 of 17% and no HER-2 amplification  (1) additional surgery for margin clearance 09/16/2013 obtained negative margins  (2) Oncotype DX recurrence score of 4 predicts a risk of outside the breast recurrence within 10 years of 7% if the patient's only systemic therapy is tamoxifen for 5 years. It also predicts no benefit from chemotherapy  (3) adjuvant radiation completed 01/07/2014  (4) anastrozole started 02/27/2014 stopped within 2 weeks because of arm swelling.   (a) bone density April 2016 showed osteopenia, with a t-score of -1.6  (b) anastrozole resumed 12/17/2015  (c) Bone density 09/20/2016 showed a T score of -2.2  (5) history of left lower extremity DVT 11/23/2012, initially on rivaroxaban, which caused chest pain, switch to Coumadin July 2014  (b) coumadin dose increased to 7.5/10 mg alternating days as of 06/13/2017   METASTATIC DISEASE: August 2018 (6) status post left total hip replacement 01/16/2017 for estrogen receptor positive adenocarcinoma.  (a) CA 27-29 was 46.4 as of 04/18/2017  (b) chest CT scan 05/02/2017 shows no lung or liver lesion concern; it does show aortic atherosclerosis  (c) baseline bone scan 05/02/2017 was negative  (d) PET scan 09/12/2017 shows no active disease, including bone  (e) PET scan and CT chest on 05/29/2018 show no active disease  (7) fulvestrant started 04/18/2017  (8) denosumab/Xgeva started 05/16/2017  (a) changed  to every 12-weeks after 10/03/2017 dose   (b) held starting with 06/13/2018 dose due to dental concerns  PLAN:  Adaleigh continues on treatment with  Fulvestrant every 4 weeks and is tolerating this well.  She is due for restaging scans and is planning on calling the central radiology schedulers back today to get these arranged.  She will keep her appointment tomorrow for her mammogram and ultrasound.  I reassured Nedda that she is not on any medications that decrease her immune system or will affect healing of her left great toe.  I recommended that if she is concerned about her toe, to call Dr. Mellody Drown office, as he would be happy to see her back and evaluate her.     She will continue on Norco as prescribed for her pain control.  PMP aware was reviewed today.  Fills have been appropriate.  Kaliya will return in 4 weeks for labs, f/u, and her next injection.  She was recommended to keep appropriate pandemic precautions.  She knows to call for any other issue that may develop before that visit.  A total of (20) minutes of face-to-face time was spent with this patient with greater than 50% of that time in counseling and care-coordination.   Wilber Bihari, NP 12/27/18 1:01 PM Medical Oncology and Hematology [facility] [address] [place], [place] [address] Tel. [phone]    Fax. [phone]

## 2018-12-27 NOTE — Patient Instructions (Signed)

## 2018-12-28 ENCOUNTER — Telehealth: Payer: Self-pay | Admitting: Adult Health

## 2018-12-28 ENCOUNTER — Ambulatory Visit
Admission: RE | Admit: 2018-12-28 | Discharge: 2018-12-28 | Disposition: A | Discharge status: Home / Self Care | Payer: Medicare Other | Source: Ambulatory Visit | Attending: Adult Health | Admitting: Adult Health

## 2018-12-28 ENCOUNTER — Ambulatory Visit: Payer: Medicare Other

## 2018-12-28 DIAGNOSIS — C50411 Malignant neoplasm of upper-outer quadrant of right female breast: Secondary | ICD-10-CM

## 2018-12-28 DIAGNOSIS — Z853 Personal history of malignant neoplasm of breast: Secondary | ICD-10-CM | POA: Diagnosis not present

## 2018-12-28 DIAGNOSIS — Z170 Estrogen receptor positive status [ER+]: Secondary | ICD-10-CM

## 2018-12-28 DIAGNOSIS — C7951 Secondary malignant neoplasm of bone: Secondary | ICD-10-CM

## 2018-12-28 DIAGNOSIS — R922 Inconclusive mammogram: Secondary | ICD-10-CM | POA: Diagnosis not present

## 2018-12-28 LAB — CANCER ANTIGEN 27.29: CA 27.29: 35.3 U/mL (ref 0.0–38.6)

## 2018-12-28 NOTE — Telephone Encounter (Signed)
I talk with patient regarding 8/27

## 2019-01-14 ENCOUNTER — Other Ambulatory Visit: Payer: Medicare Other

## 2019-01-20 ENCOUNTER — Other Ambulatory Visit: Payer: Self-pay | Admitting: Oncology

## 2019-01-20 DIAGNOSIS — M84559P Pathological fracture in neoplastic disease, hip, unspecified, subsequent encounter for fracture with malunion: Secondary | ICD-10-CM

## 2019-01-20 MED ORDER — HYDROCODONE-ACETAMINOPHEN 5-325 MG PO TABS: 1.0000 | ORAL_TABLET | Freq: Four times a day (QID) | ORAL | 0 refills | Status: DC | PRN

## 2019-01-23 ENCOUNTER — Ambulatory Visit: Payer: Medicare Other

## 2019-01-23 ENCOUNTER — Other Ambulatory Visit: Payer: Medicare Other

## 2019-01-24 ENCOUNTER — Other Ambulatory Visit: Payer: Self-pay

## 2019-01-24 ENCOUNTER — Encounter: Payer: Self-pay | Admitting: Adult Health

## 2019-01-24 ENCOUNTER — Inpatient Hospital Stay: Payer: Medicare Other

## 2019-01-24 ENCOUNTER — Inpatient Hospital Stay (HOSPITAL_BASED_OUTPATIENT_CLINIC_OR_DEPARTMENT_OTHER): Payer: Medicare Other | Admitting: Adult Health

## 2019-01-24 ENCOUNTER — Inpatient Hospital Stay: Payer: Medicare Other | Attending: Oncology

## 2019-01-24 VITALS — BP 136/59 | HR 74 | Temp 98.5°F | Resp 18 | Ht 64.0 in | Wt 233.3 lb

## 2019-01-24 DIAGNOSIS — C7951 Secondary malignant neoplasm of bone: Secondary | ICD-10-CM

## 2019-01-24 DIAGNOSIS — F419 Anxiety disorder, unspecified: Secondary | ICD-10-CM | POA: Diagnosis not present

## 2019-01-24 DIAGNOSIS — R600 Localized edema: Secondary | ICD-10-CM | POA: Diagnosis not present

## 2019-01-24 DIAGNOSIS — C50411 Malignant neoplasm of upper-outer quadrant of right female breast: Secondary | ICD-10-CM

## 2019-01-24 DIAGNOSIS — M8580 Other specified disorders of bone density and structure, unspecified site: Secondary | ICD-10-CM

## 2019-01-24 DIAGNOSIS — Z79899 Other long term (current) drug therapy: Secondary | ICD-10-CM | POA: Diagnosis not present

## 2019-01-24 DIAGNOSIS — M1990 Unspecified osteoarthritis, unspecified site: Secondary | ICD-10-CM | POA: Insufficient documentation

## 2019-01-24 DIAGNOSIS — G893 Neoplasm related pain (acute) (chronic): Secondary | ICD-10-CM

## 2019-01-24 DIAGNOSIS — Z86718 Personal history of other venous thrombosis and embolism: Secondary | ICD-10-CM | POA: Diagnosis not present

## 2019-01-24 DIAGNOSIS — Z79818 Long term (current) use of other agents affecting estrogen receptors and estrogen levels: Secondary | ICD-10-CM | POA: Diagnosis not present

## 2019-01-24 DIAGNOSIS — I10 Essential (primary) hypertension: Secondary | ICD-10-CM | POA: Diagnosis not present

## 2019-01-24 DIAGNOSIS — Z923 Personal history of irradiation: Secondary | ICD-10-CM | POA: Insufficient documentation

## 2019-01-24 DIAGNOSIS — Z170 Estrogen receptor positive status [ER+]: Secondary | ICD-10-CM

## 2019-01-24 DIAGNOSIS — E039 Hypothyroidism, unspecified: Secondary | ICD-10-CM | POA: Diagnosis not present

## 2019-01-24 DIAGNOSIS — Z7901 Long term (current) use of anticoagulants: Secondary | ICD-10-CM | POA: Diagnosis not present

## 2019-01-24 DIAGNOSIS — M84659P Pathological fracture in other disease, hip, unspecified, subsequent encounter for fracture with malunion: Secondary | ICD-10-CM

## 2019-01-24 DIAGNOSIS — M899 Disorder of bone, unspecified: Secondary | ICD-10-CM

## 2019-01-24 DIAGNOSIS — I82401 Acute embolism and thrombosis of unspecified deep veins of right lower extremity: Secondary | ICD-10-CM

## 2019-01-24 DIAGNOSIS — D6859 Other primary thrombophilia: Secondary | ICD-10-CM

## 2019-01-24 DIAGNOSIS — I890 Lymphedema, not elsewhere classified: Secondary | ICD-10-CM

## 2019-01-24 DIAGNOSIS — M84459A Pathological fracture, hip, unspecified, initial encounter for fracture: Secondary | ICD-10-CM

## 2019-01-24 DIAGNOSIS — I700 Atherosclerosis of aorta: Secondary | ICD-10-CM

## 2019-01-24 LAB — PROTIME-INR
INR: 2 — ABNORMAL HIGH (ref 0.8–1.2)
Prothrombin Time: 22.6 seconds — ABNORMAL HIGH (ref 11.4–15.2)

## 2019-01-24 LAB — CBC WITH DIFFERENTIAL/PLATELET
Abs Immature Granulocytes: 0.03 10*3/uL (ref 0.00–0.07)
Basophils Absolute: 0 10*3/uL (ref 0.0–0.1)
Basophils Relative: 1 %
Eosinophils Absolute: 0.2 10*3/uL (ref 0.0–0.5)
Eosinophils Relative: 3 %
HCT: 39.9 % (ref 36.0–46.0)
Hemoglobin: 12.7 g/dL (ref 12.0–15.0)
Immature Granulocytes: 0 %
Lymphocytes Relative: 13 %
Lymphs Abs: 1 10*3/uL (ref 0.7–4.0)
MCH: 28.7 pg (ref 26.0–34.0)
MCHC: 31.8 g/dL (ref 30.0–36.0)
MCV: 90.3 fL (ref 80.0–100.0)
Monocytes Absolute: 0.9 10*3/uL (ref 0.1–1.0)
Monocytes Relative: 11 %
Neutro Abs: 5.8 10*3/uL (ref 1.7–7.7)
Neutrophils Relative %: 72 %
Platelets: 229 10*3/uL (ref 150–400)
RBC: 4.42 MIL/uL (ref 3.87–5.11)
RDW: 14.7 % (ref 11.5–15.5)
WBC: 8 10*3/uL (ref 4.0–10.5)
nRBC: 0 % (ref 0.0–0.2)

## 2019-01-24 LAB — COMPREHENSIVE METABOLIC PANEL
ALT: 14 U/L (ref 0–44)
AST: 15 U/L (ref 15–41)
Albumin: 3.7 g/dL (ref 3.5–5.0)
Alkaline Phosphatase: 103 U/L (ref 38–126)
Anion gap: 6 (ref 5–15)
BUN: 16 mg/dL (ref 8–23)
CO2: 27 mmol/L (ref 22–32)
Calcium: 10.5 mg/dL — ABNORMAL HIGH (ref 8.9–10.3)
Chloride: 106 mmol/L (ref 98–111)
Creatinine, Ser: 0.79 mg/dL (ref 0.44–1.00)
GFR calc Af Amer: >60 mL/min (ref >60)
GFR calc non Af Amer: >60 mL/min (ref >60)
Glucose, Bld: 106 mg/dL — ABNORMAL HIGH (ref 70–99)
Potassium: 4.1 mmol/L (ref 3.5–5.1)
Sodium: 139 mmol/L (ref 135–145)
Total Bilirubin: 0.4 mg/dL (ref 0.3–1.2)
Total Protein: 6.8 g/dL (ref 6.5–8.1)

## 2019-01-24 MED ORDER — FULVESTRANT 250 MG/5ML IM SOLN
INTRAMUSCULAR | Status: AC
  Filled 2019-01-24: qty 10

## 2019-01-24 MED ORDER — FULVESTRANT 250 MG/5ML IM SOLN
500.0000 mg | Freq: Once | INTRAMUSCULAR | Status: AC
  Administered 2019-01-24: 500 mg via INTRAMUSCULAR

## 2019-01-24 NOTE — Progress Notes (Signed)
[place]  [phone] [phone]) [phone]     ID: Alicia Vasquez OB: 1940-11-27  MR#: [id number]  YBF#:[id number]  Patient Care Team: Alicia Deutscher, DO as PCP - General (Family Medicine) Alicia Kingdom, MD as Consulting Physician (Internal Medicine) Alicia Renshaw, MD as Referring Physician (Internal Medicine) Vasquez, Alicia Dad, MD as Consulting Physician (Oncology) Alicia Vasquez, DDS as Consulting Physician (Dentistry) Alicia Vasquez, [place] as Consulting Physician (Physician Assistant) Alicia Vasquez, DPM as Consulting Physician (Podiatry) OTHER MD:   CHIEF COMPLAINT: Estrogen receptor positive breast cancer  CURRENT TREATMENT:  Fulvestrant; warfarin   INTERVAL HISTORY: Alicia Vasquez is here today for follow up and treatment of her metastatic estrogen receptor positive breast cancer.  Alicia Vasquez receives fulvestrant every 4 weeks, and is tolerating it well.  She is overdue for her CT chest/abd/pelvis and bone scan.  She is taking Warfarin.  INRs have been stable.  Alicia Vasquez denies any easy bruising or bleeding.   REVIEW OF SYSTEMS: Alicia Vasquez is doing moderately well today.  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  This has been present since her surgery.  She takes Norco about 4 times a day, and her pain is controlled.  She denies any increase in pain due to this.  She notes that her right arm has become more swollen.  She says that she is walking around more with her walker, and since she has been doing this she noted the swelling.  She wants to know what to do for this.    Alicia Vasquez is otherwise feeling well.  She continues to live with her friend and retired Therapist, sports catherine who helps take care of her.  She has no fever or chills.  She is without any new pain, constipation, diarrhea, bladder concerns, nausea, or vomiting.  She has no cough, shortness of breath, chest pain, or palpitations.  A detailed ROS was otherwise non contributory.    BREAST  CANCER HISTORY: From doctor Alicia Vasquez's intake node 07/24/2013:  "78 y.o. female. Who presented with SOB and had a CT chest performed that revealed a right breast mass. Mammogram/ultrsound on 2/13 showed a mass in the 11 o'clock position in the right breast measuring 1.5 cm. Also noted was a right axillary LN measuring 1.6 cm. MRI not performed. Biopsy of mass and lymph done. 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 Lymph node + for metastatic carcinoma."  [On 09/09/2013 the patient underwent right lumpectomy and sentinel lymph node sampling. This showed ([id number]) multifocal invasive ductal carcinoma, grade 1, the largest lesion measuring 1.8 cm, the second lesion 1.2 cm. One of 4 sentinel lymph nodes was positive, with extracapsular extension. Margins were positive. HER-2 was repeated and was again negative. Further surgery 09/16/2013 obtained clear margins.  Her subsequent history is as detailed below   PAST MEDICAL HISTORY: Past Medical History:  Diagnosis Date  . Allergy   . Anxiety   . Arthritis   . Blood transfusion without reported diagnosis   . Breast cancer ([facility]) 07/12/2013   Invasive Mammary Carcinoma  . DVT (deep vein thrombosis) in pregnancy   . Hypertension   . Hypothyroid   . Personal history of radiation therapy   . Pneumonia   . Radiation 11/21/13-01/07/14   Right Breast/Supraclavicular    PAST SURGICAL HISTORY: Past Surgical History:  Procedure Laterality Date  . BREAST LUMPECTOMY Left 2015  .  BREAST LUMPECTOMY WITH RADIOACTIVE SEED LOCALIZATION Right 09/09/2013   Procedure: BREAST LUMPECTOMY WITH RADIOACTIVE SEED LOCALIZATION WITH AXILLARY NODE EXCISION;  Surgeon: [name], MD;  Location: [facility];  Service: General;  Laterality: Right;  . DENTAL SURGERY  04/19/2012   13 TEETH REMOVED  . DILATION AND CURETTAGE OF UTERUS     . ORIF PERIPROSTHETIC FRACTURE Left 01/31/2017   Procedure: REVISION and OPEN REDUCTION INTERNAL FIXATION (ORIF) PERIPROSTHETIC FRACTURE LEFT HIP;  Surgeon: Paralee Cancel, MD;  Location: [facility];  Service: Orthopedics;  Laterality: Left;  120 mins  . RE-EXCISION OF BREAST LUMPECTOMY Right 09/24/2013   Procedure: RE-EXCISION OF RIGHT BREAST LUMPECTOMY;  Surgeon: [name], MD;  Location: [facility];  Service: General;  Laterality: Right;  . TOTAL HIP ARTHROPLASTY Left 01/16/2017   Procedure: TOTAL HIP ARTHROPLASTY POSTERIOR;  Surgeon: Paralee Cancel, MD;  Location: [facility];  Service: Orthopedics;  Laterality: Left;    FAMILY HISTORY Family History  Problem Relation Age of Onset  . Heart disease Brother   . Colon cancer Brother   . Prostate cancer Brother    the patient's father died at the age of 79 after an automobile accident. The patient's mother died at the age of 53. She was a Marine scientist here in [name] in the old [facility]. She was infected with polio and was confined to a wheelchair for a good part of her life. She eventually died of pneumonia. The patient had one brother, who died with prostate cancer. She had no sisters. There is no history of breast or ovarian cancer in the family.   GYNECOLOGIC HISTORY:  Menarche age 61, first live birth age 35, the patient is GX P1. She went through the change of life at age 75. 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 She is widowed. Currently she is staying with her friend [name],  92, who is a retired Radio producer. The patient's son [name] lives in [place]. He works an Engineer, technical sales. The patient has no grandchildren. She is a Tourist information centre manager but currently attends a [employer] with her friend [name]   ADVANCED DIRECTIVES: Not in place   HEALTH MAINTENANCE: Social History   Tobacco Use  . Smoking status: Never Smoker  . Smokeless  tobacco: Never Used  Substance Use Topics  . Alcohol use: No    Alcohol/week: 0.0 standard drinks  . Drug use: No     Colonoscopy: Never  PAP:  Bone density: 09/20/2016 showed a T score of -2.2  Lipid panel:  Allergies  Allergen Reactions  . Anesthetics, [place]ide Hypertension  . Benadryl [Diphenhydramine Hcl] Other (See Comments)    Dizziness  . Carbocaine [Mepivacaine Hcl] Hypertension  . Codeine Other (See Comments)    Dizziness  . Epinephrine Hypertension  . Sulfa Antibiotics Other (See Comments)    dizziness  . Latex Other (See Comments)    Blisters in mouth  . Tramadol     Sedation.   Marland Kitchen Penicillins Rash    Has patient had a PCN reaction causing immediate rash, facial/tongue/throat swelling, SOB or lightheadedness with hypotension: Unknown Has patient had a PCN reaction causing severe rash involving mucus membranes or skin necrosis: Unknown Has patient had a PCN reaction that required hospitalization: Unknown Has patient had a PCN reaction occurring within the last 10 years: Unknown If all of the above answers are "NO", then may proceed with Cephalosporin use.  Current Outpatient Medications  Medication Sig Dispense Refill  . Cholecalciferol (VITAMIN D3) 5000 UNITS TABS Take 5,000 Units by mouth daily.     Marland Kitchen HYDROcodone-acetaminophen (NORCO/VICODIN) 5-325 MG tablet Take 1 tablet by mouth every 6 (six) hours as needed for moderate pain. 120 tablet 0  . ketoconazole (NIZORAL) 2 % cream Apply 1 application topically daily. 15 g 0  . SYNTHROID 125 MCG tablet TAKE 1 TABLET BY MOUTH EVERY DAY BEFORE BREAKFAST 30 tablet 2  . vitamin C (ASCORBIC ACID) 250 MG tablet Take 250 mg by mouth 3 (three) times daily.    Marland Kitchen warfarin (COUMADIN) 5 MG tablet TAKE 1.5-2 TABLETS ONE TIME ONLY AT 6 PM FOR 1 DOSE. TAKE 7.5 MG ALTERNATING WITH 10 MG DAILY 270 tablet 1   No current facility-administered medications for this visit.     OBJECTIVE:  Vitals:   01/24/19 1254  BP: (!) 136/59   Pulse: 74  Resp: 18  Temp: 98.5 F (36.9 C)  SpO2: 95%     Body mass index is 40.05 kg/m.    ECOG FS: 2 - Symptomatic, <50% confined to bed GENERAL: Patient is a well appearing female in no acute distress HEENT:  Sclerae anicteric.  Oropharynx clear and moist. No ulcerations or evidence of oropharyngeal candidiasis. Neck is supple.  NODES: Fullness noted in right axilla.   BREAST EXAM:  Deferred today LUNGS:  Clear to auscultation bilaterally.  No wheezes or rhonchi. HEART:  Regular rate and rhythm. No murmur appreciated. ABDOMEN:  Soft, nontender.  Positive, normoactive bowel sounds. No organomegaly palpated. MSK:  No focal spinal tenderness to palpation. Full range of motion bilaterally in the upper extremities. EXTREMITIES:  + right arm swelling SKIN:  Clear with no obvious rashes or skin changes. No nail dyscrasia. NEURO:  Nonfocal. Well oriented.  Appropriate affect.     LAB RESULTS:  CMP     Component Value Date/Time   NA 139 01/24/2019 1207   NA 141 05/16/2017 1416   K 4.1 01/24/2019 1207   K 3.8 05/16/2017 1416   CL 106 01/24/2019 1207   CO2 27 01/24/2019 1207   CO2 27 05/16/2017 1416   GLUCOSE 106 (H) 01/24/2019 1207   GLUCOSE 140 05/16/2017 1416   BUN 16 01/24/2019 1207   BUN 13.2 05/16/2017 1416   CREATININE 0.79 01/24/2019 1207   CREATININE 0.77 03/21/2018 1417   CREATININE 0.80 09/22/2017 1555   CREATININE 0.8 05/16/2017 1416   CALCIUM 10.5 (H) 01/24/2019 1207   CALCIUM 11.0 (H) 05/16/2017 1416   PROT 6.8 01/24/2019 1207   PROT 6.9 05/16/2017 1416   ALBUMIN 3.7 01/24/2019 1207   ALBUMIN 3.6 05/16/2017 1416   AST 15 01/24/2019 1207   AST 14 (L) 03/21/2018 1417   AST 13 05/16/2017 1416   ALT 14 01/24/2019 1207   ALT 14 03/21/2018 1417   ALT <6 05/16/2017 1416   ALKPHOS 103 01/24/2019 1207   ALKPHOS 130 05/16/2017 1416   BILITOT 0.4 01/24/2019 1207   BILITOT 0.3 03/21/2018 1417   BILITOT 0.31 05/16/2017 1416   GFRNONAA >60 01/24/2019 1207    GFRNONAA >60 03/21/2018 1417   GFRNONAA 75 08/19/2015 1602   GFRAA >60 01/24/2019 1207   GFRAA >60 03/21/2018 1417   GFRAA 87 08/19/2015 1602    I No results found for: SPEP  Lab Results  Component Value Date   WBC 8.0 01/24/2019   NEUTROABS 5.8 01/24/2019   HGB 12.7 01/24/2019   HCT 39.9 01/24/2019  MCV 90.3 01/24/2019   PLT 229 01/24/2019      Chemistry      Component Value Date/Time   NA 139 01/24/2019 1207   NA 141 05/16/2017 1416   K 4.1 01/24/2019 1207   K 3.8 05/16/2017 1416   CL 106 01/24/2019 1207   CO2 27 01/24/2019 1207   CO2 27 05/16/2017 1416   BUN 16 01/24/2019 1207   BUN 13.2 05/16/2017 1416   CREATININE 0.79 01/24/2019 1207   CREATININE 0.77 03/21/2018 1417   CREATININE 0.80 09/22/2017 1555   CREATININE 0.8 05/16/2017 1416      Component Value Date/Time   CALCIUM 10.5 (H) 01/24/2019 1207   CALCIUM 11.0 (H) 05/16/2017 1416   ALKPHOS 103 01/24/2019 1207   ALKPHOS 130 05/16/2017 1416   AST 15 01/24/2019 1207   AST 14 (L) 03/21/2018 1417   AST 13 05/16/2017 1416   ALT 14 01/24/2019 1207   ALT 14 03/21/2018 1417   ALT <6 05/16/2017 1416   BILITOT 0.4 01/24/2019 1207   BILITOT 0.3 03/21/2018 1417   BILITOT 0.31 05/16/2017 1416       No results found for: LABCA2  No components found for: LABCA125  Recent Labs  Lab 01/24/19 1209  INR 2.0*    Urinalysis    Component Value Date/Time   COLORURINE STRAW (A) 02/20/2018 1606   APPEARANCEUR CLEAR 02/20/2018 1606   LABSPEC 1.009 02/20/2018 1606   LABSPEC 1.010 09/22/2016 1553   PHURINE 6.0 02/20/2018 1606   GLUCOSEU NEGATIVE 02/20/2018 1606   GLUCOSEU Negative 09/22/2016 1553   HGBUR NEGATIVE 02/20/2018 1606   BILIRUBINUR Negative 03/16/2018 1433   BILIRUBINUR Negative 09/22/2016 1553   KETONESUR NEGATIVE 02/20/2018 1606   PROTEINUR Negative 03/16/2018 1433   PROTEINUR NEGATIVE 02/20/2018 1606   UROBILINOGEN 0.2 03/16/2018 1433   UROBILINOGEN 0.2 11/15/2016 1358   UROBILINOGEN 0.2  09/22/2016 1553   NITRITE Negative 03/16/2018 1433   NITRITE NEGATIVE 02/20/2018 1606   LEUKOCYTESUR Negative 03/16/2018 1433   LEUKOCYTESUR Negative 09/22/2016 1553    STUDIES: Restaging studies scheduled for later this month  ASSESSMENT: 78 y.o. [place] woman status post right breast upper outer quadrant lumpectomy and sentinel lymph node sampling 09/09/2013 for an mpT1c pN1a, stage IIA invasive ductal carcinoma, estrogen and progesterone receptor both 100% positive with strong staining intensity, MIB-1 of 17% and no HER-2 amplification  (1) additional surgery for margin clearance 09/16/2013 obtained negative margins  (2) Oncotype DX recurrence score of 4 predicts a risk of outside the breast recurrence within 10 years of 7% if the patient's only systemic therapy is tamoxifen for 5 years. It also predicts no benefit from chemotherapy  (3) adjuvant radiation completed 01/07/2014  (4) anastrozole started 02/27/2014 stopped within 2 weeks because of arm swelling.   (a) bone density April 2016 showed osteopenia, with a t-score of -1.6  (b) anastrozole resumed 12/17/2015  (c) Bone density 09/20/2016 showed a T score of -2.2  (5) history of left lower extremity DVT 11/23/2012, initially on rivaroxaban, which caused chest pain, switch to Coumadin July 2014  (b) coumadin dose increased to 7.5/10 mg alternating days as of 06/13/2017   METASTATIC DISEASE: August 2018 (6) status post left total hip replacement 01/16/2017 for estrogen receptor positive adenocarcinoma.  (a) CA 27-29 was 46.4 as of 04/18/2017  (b) chest CT scan 05/02/2017 shows no lung or liver lesion concern; it does show aortic atherosclerosis  (c) baseline bone scan 05/02/2017 was negative  (d) PET scan 09/12/2017 shows no  active disease, including bone  (e) PET scan and CT chest on 05/29/2018 show no active disease  (7) fulvestrant started 04/18/2017  (8) denosumab/Xgeva started 05/16/2017  (a) changed to every  12-weeks after 10/03/2017 dose   (b) held starting with 06/13/2018 dose due to dental concerns  PLAN:  [place] is doing well today.  She has no clinical signs of progression.  She was given the phone number today so that she can call and set up her scans.  She knows these are overdue and says that she is calling today.  She will continue to receive Fulvestrant every 4 weeks until there is evidence of progression.  Alicia Vasquez has right arm swelling that is worsening.  I recommended that she wear a sleeve and I also placed a referral to PT for evaluation.    Armine will hopefully have scans before we see her back.  She will return in 4 weeks for labs, f/u, and her next injection.  She was recommended to keep appropriate pandemic precautions.  She knows to call for any other issue that may develop before that visit.  A total of (30) minutes of face-to-face time was spent with this patient with greater than 50% of that time in counseling and care-coordination.   Wilber Bihari, NP 01/24/19 2:41 PM Medical Oncology and Hematology [facility] [address] [place], [place] [address] Tel. [phone]    Fax. [phone]

## 2019-01-24 NOTE — Patient Instructions (Signed)

## 2019-01-25 ENCOUNTER — Telehealth: Payer: Self-pay | Admitting: Adult Health

## 2019-01-25 ENCOUNTER — Other Ambulatory Visit: Payer: Self-pay | Admitting: *Deleted

## 2019-01-25 LAB — CANCER ANTIGEN 27.29: CA 27.29: 31.8 U/mL (ref 0.0–38.6)

## 2019-01-25 NOTE — Telephone Encounter (Signed)
I left a message regarding schedule change per 8/27 los

## 2019-02-07 ENCOUNTER — Ambulatory Visit: Payer: Medicare Other | Attending: Adult Health | Admitting: Physical Therapy

## 2019-02-07 ENCOUNTER — Other Ambulatory Visit: Payer: Self-pay

## 2019-02-07 DIAGNOSIS — I890 Lymphedema, not elsewhere classified: Secondary | ICD-10-CM | POA: Diagnosis not present

## 2019-02-07 DIAGNOSIS — M25611 Stiffness of right shoulder, not elsewhere classified: Secondary | ICD-10-CM | POA: Diagnosis not present

## 2019-02-07 DIAGNOSIS — R293 Abnormal posture: Secondary | ICD-10-CM | POA: Diagnosis not present

## 2019-02-07 NOTE — Therapy (Signed)
[place], [name], [address] Phone: [phone]   Fax:  [phone]  Physical Therapy Evaluation  Patient Details  Name: Alicia Vasquez MRN: [id number] Date of Birth: [birth year] Referring Provider (PT): Wilber Bihari    Encounter Date: 02/07/2019  PT End of Session - 02/07/19 1250    Visit Number  1    Number of Visits  6    Date for PT Re-Evaluation  03/20/19    PT Start Time  1100    PT Stop Time  1140    PT Time Calculation (min)  40 min    Activity Tolerance  Patient tolerated treatment well    Behavior During Therapy  [facility] for tasks assessed/performed       Past Medical History:  Diagnosis Date  . Allergy   . Anxiety   . Arthritis   . Blood transfusion without reported diagnosis   . Breast cancer ([place]) 07/12/2013   Invasive Mammary Carcinoma  . DVT (deep vein thrombosis) in pregnancy   . Hypertension   . Hypothyroid   . Personal history of radiation therapy   . Pneumonia   . Radiation 11/21/13-01/07/14   Right Breast/Supraclavicular    Past Surgical History:  Procedure Laterality Date  . BREAST LUMPECTOMY Left 2015  . BREAST LUMPECTOMY WITH RADIOACTIVE SEED LOCALIZATION Right 09/09/2013   Procedure: BREAST LUMPECTOMY WITH RADIOACTIVE SEED LOCALIZATION WITH AXILLARY NODE EXCISION;  Surgeon: [name], MD;  Location: [place];  Service: General;  Laterality: Right;  . DENTAL SURGERY  04/19/2012   13 TEETH REMOVED  . DILATION AND CURETTAGE OF UTERUS    . ORIF PERIPROSTHETIC FRACTURE Left 01/31/2017   Procedure: REVISION and OPEN REDUCTION INTERNAL FIXATION (ORIF) PERIPROSTHETIC FRACTURE LEFT HIP;  Surgeon: Paralee Cancel, MD;  [facility];  Service: Orthopedics;  Laterality: Left;  120 mins  . RE-EXCISION OF BREAST LUMPECTOMY Right 09/24/2013   Procedure: RE-EXCISION OF RIGHT BREAST LUMPECTOMY;  Surgeon: [name], MD;  Location: [place];  Service: General;  Laterality: Right;  . TOTAL HIP ARTHROPLASTY Left 01/16/2017   Procedure: TOTAL HIP ARTHROPLASTY POSTERIOR;  Surgeon: Paralee Cancel, MD;  [facility];  Service: Orthopedics;  Laterality: Left;    There were no vitals filed for this visit.   Subjective Assessment - 02/07/19 1111    Subjective  Pt comes in today with mulitple complaints of neuropathy in feet and pain in left leg, but she is her because of the swelling in her right arm and lateral chest.    Pertinent History  breast cancer in 2015 with lumpectomy and 4 sentinel nodes with second surgery to get clear margins.  She underwent  radiation.  Multiple complaints of pain in back, left legs neck right  shoulder and arm. She had metastatic bone caner in left hip resuling in 2 hip surgeries.  She had prolonged course of rehab and comes in walking with rolling walker    Currently in Pain?  Yes    Pain Score  9     Pain Location  Generalized   hip and back and right  chest where the incision was   Pain Orientation  Right    Pain Descriptors / Indicators  Aching    Pain Type  Chronic pain    Pain Radiating Towards  radiates    Pain Onset  More than a month ago    Pain Frequency  Constant    Aggravating Factors  hurst worse when she is lying down    Pain Relieving Factors  pain medicine    Effect of Pain on Daily Activities  limits walking         [facility] PT Assessment - 02/07/19 0001      Assessment   Medical Diagnosis  metastatic breast cancer     Referring Provider (PT)  Wilber Bihari     Onset Date/Surgical Date  09/09/13    Hand Dominance  Right    Prior Therapy  yes prolonged episode of PT in 2016 for exercise and pain control . for lypmhedema of right arm in 2017       Precautions   Precautions  Other (comment)    Precaution Comments  metastatic cancer with previous radiation. no chemotherapy       Restrictions   Weight Bearing Restrictions  No      Balance Screen   Has the patient  fallen in the past 6 months  No    Has the patient had a decrease in activity level because of a fear of falling?   No    Is the patient reluctant to leave their home because of a fear of falling?   No   hasn't left her home due to covid precaution      Friend residence    Living Arrangements  Non-relatives/Friends    Available Help at Discharge  Available PRN/intermittently      Prior Function   Level of [place]  Retired    Leisure  taking care of personal business, does not exercise regularly  just walks aound the house       Cognition   Overall Cognitive Status  Within Functional Limits for tasks assessed   HOH, needs some redirection to tasks      Observation/Other Assessments   Observations  well healed incision at right lateral breast. She has mild fullness around incision, lateral chest and upper arm  right arm appears to be slightly larger than left arm     Skin Integrity  no open areas     Quick DASH   --      Observation/Other Assessments-Edema    Edema  --   yes     Sensation   Light Touch  Not tested      Coordination   Gross Motor Movements are Fluid and Coordinated  Not tested   slow but functional,pt leans forward due to back pain      Sit to Stand   Comments  --      Posture/Postural Control   Posture/Postural Control  Postural limitations    Postural Limitations  Rounded Shoulders;Forward head;Increased lumbar lordosis;Decreased thoracic kyphosis    Posture Comments  --      AROM   Right Shoulder Flexion  137 Degrees    Right Shoulder ABduction  110 Degrees    Right Shoulder External Rotation  60 Degrees    Left Shoulder Flexion  135 Degrees    Left Shoulder ABduction  110 Degrees    Left Shoulder External Rotation  80 Degrees    Cervical Flexion  --    Cervical Extension  --    Cervical - Right Side Bend  --    Cervical - Left Side Bend  --    Lumbar Flexion  --    Lumbar -  Right Side Bend  --    Lumbar - Left  Side Bend  --    Lumbar - Right Rotation  --    Lumbar - Left Rotation  --      Strength   Overall Strength  Due to pain    Overall Strength Comments  appears to have generalized deconditioning.       Palpation   Palpation comment  very tender to firm palpation at right axilla       Special Tests    Special Tests  --    Hip Special Tests   --      Trendelenburg Test   Side  --      Bed Mobility   Bed Mobility  --      Ambulation/Gait   Gait Comments  --      6 Minute Walk- Baseline   6 Minute Walk- Baseline  --    Modified Borg Scale for Dyspnea  --    Perceived Rate of Exertion (Borg)  --      High Level Balance   High Level Balance Activities  --    High Level Balance Comments  --      Berg Balance Test   Sit to Stand  --    Standing Unsupported  --    Sitting with Back Unsupported but Feet Supported on Floor or Stool  --    Stand to Sit  --    Transfers  --    Standing Unsupported with Eyes Closed  --    Standing Unsupported with Feet Together  --    From Standing, Reach Forward with Outstretched Arm  --    From Standing Position, Pick up Object from Floor  --    From Standing Position, Turn to Look Behind Over each Shoulder  --    Turn 360 Degrees  --    Standing Unsupported, Alternately Place Feet on Step/Stool  --    Standing Unsupported, One Foot in Front  --    Standing on One Leg  --    Total Score  --      Timed Up and Go Test   Normal TUG (seconds)  --    Manual TUG (seconds)  --    Cognitive TUG (seconds)  --        LYMPHEDEMA/ONCOLOGY QUESTIONNAIRE - 02/07/19 1126      Right Upper Extremity Lymphedema   15 cm Proximal to Olecranon Process  40 cm    10 cm Proximal to Olecranon Process  38 cm    Olecranon Process  29.8 cm    15 cm Proximal to Ulnar Styloid Process  27.2 cm    10 cm Proximal to Ulnar Styloid Process  24.4 cm    Just Proximal to Ulnar Styloid Process  17 cm    Across Hand at [employer]  19.5 cm    At [place] of 2nd Digit  6.5 cm      Left Upper Extremity Lymphedema   15 cm Proximal to Olecranon Process  37 cm    10 cm Proximal to Olecranon Process  35.5 cm    Olecranon Process  27 cm    15 cm Proximal to Ulnar Styloid Process  25.8 cm    10 cm Proximal to Ulnar Styloid Process  24 cm    Just Proximal to Ulnar Styloid Process  16.2 cm    Across Hand at [employer]  19 cm    At [place] of 2nd Digit  6.2 cm  Objective measurements completed on examination: See above findings.                   PT Long Term Goals - 02/07/19 1308      PT LONG TERM GOAL #1   Title  Pt will be independent in self manual lymph drainage and use of compression to manage her right arm lymphedema at home    Time  6    Period  Weeks    Status  New      PT LONG TERM GOAL #2   Title  Pt will be independent in HEP for remedial lymphedema exercise and shoulder ROM    Time  6    Period  Weeks    Status  New             Plan - 02/07/19 1250    Clinical Impression Statement  [facility] is well known to this PT from previous treatment and comes back to PT now beacuse of swelling her right arm. At last visit in 2017 she did have increased circumference of right arm compared to left.  Today, she measures with increased in both arms and right arm is about the same degree of increase as it was last time except for mildy greater increases in right upper arm. Delainie was not able to apply a circular knit comression sleeve because it was to tight to apply and she no longer has the velcro wrap garment or glove  She will benefit from a review of self manual lymph drainage and a new velcro garment and glove and is eligible for the grant from [employer] as she has Medicare. Arrangements in process for her to be measured by [facility]    Personal Factors and Comorbidities  Age;Comorbidity 3+    Comorbidities  metastatic cancer, arthritis, precious radiation and lymphedema     Stability/Clinical Decision Making  Stable/Uncomplicated    Clinical Decision Making  Low    Clinical Impairments Affecting Rehab Potential  previous radiation, 4 nodes removed,  chronic pain limiting her activities     PT Frequency  1x / week    PT Duration  6 weeks    PT Treatment/Interventions  ADLs/Self Care Home Management;Therapeutic exercise;Orthotic Fit/Training;Patient/family education;Scar mobilization;Manual lymph drainage;Manual techniques    PT Next Visit Plan  perform and instruct in self MLD    Consulted and Agree with Plan of Care  Patient       Patient will benefit from skilled therapeutic intervention in order to improve the following deficits and impairments:  Obesity, Increased edema, Increased fascial restricitons, Impaired UE functional use, Pain, Decreased mobility, Difficulty walking, Increased muscle spasms, Decreased range of motion, Impaired perceived functional ability, Postural dysfunction, Impaired flexibility  Visit Diagnosis: Lymphedema, not elsewhere classified - Plan: PT plan of care cert/re-cert  Stiffness of right shoulder joint - Plan: PT plan of care cert/re-cert  Abnormal posture - Plan: PT plan of care cert/re-cert     Problem List Patient Active Problem List   Diagnosis Date Noted  . Morbid obesity with BMI of 40.0-44.9, adult ([place]) 10/31/2018  . Aortic atherosclerosis ([place]) 06/13/2017  . Peri-prosthetic fracture of shaft of femur 01/26/2017  . Pain from bone metastases ([place]) 01/25/2017  . Bone metastases ([place]) 01/25/2017  . Endometrial hyperplasia 01/16/2017  . Lytic bone lesion of left femur 01/15/2017  . Hip fracture, pathological ([place]) 01/15/2017  . Lymphedema 09/17/2015  . Long term current use of anticoagulant therapy 08/23/2015  . DVT, lower  extremity ([place]) 06/18/2015  . Bilateral knee pain 04/03/2015  . Arm edema 08/28/2014  . Vitamin D deficiency 04/23/2014  . Hyperparathyroidism ([place]) 04/23/2014  . Depression 07/18/2013  .  Malignant neoplasm of upper-outer quadrant of right breast in female, estrogen receptor positive ([place]) 07/15/2013  . Overactive bladder 01/30/2013  . Primary hypercoagulable state ([place]) 12/19/2012  . HTN (hypertension) 09/27/2011  . Hearing loss 09/27/2011  . Seasonal allergies 09/27/2011  . Hypothyroid 08/26/2011   [name]. Owens Shark PT  [name] 02/07/2019, 1:12 PM  [place], [name], [address] Phone: [phone]   Fax:  [phone]  Name: Alicia Vasquez MRN: [id number] Date of Birth: [birth year]

## 2019-02-13 ENCOUNTER — Encounter: Payer: Self-pay | Admitting: Adult Health

## 2019-02-13 NOTE — Progress Notes (Signed)
Received invoice from [facility] for lymphedema products.  Approved and submitted request for payment processing

## 2019-02-19 ENCOUNTER — Ambulatory Visit: Payer: Medicare Other | Admitting: Physical Therapy

## 2019-02-19 ENCOUNTER — Encounter: Payer: Self-pay | Admitting: Physical Therapy

## 2019-02-19 ENCOUNTER — Other Ambulatory Visit: Payer: Self-pay

## 2019-02-19 DIAGNOSIS — R293 Abnormal posture: Secondary | ICD-10-CM | POA: Diagnosis not present

## 2019-02-19 DIAGNOSIS — M25611 Stiffness of right shoulder, not elsewhere classified: Secondary | ICD-10-CM

## 2019-02-19 DIAGNOSIS — I890 Lymphedema, not elsewhere classified: Secondary | ICD-10-CM

## 2019-02-19 NOTE — Therapy (Signed)
[place], [name], [address] Phone: [phone]   Fax:  [phone]  Physical Therapy Treatment  Patient Details  Name: Alicia Vasquez [facility] MRN: [id number] Date of Birth: [birth year] Referring Provider (PT): Wilber Bihari    Encounter Date: 02/19/2019  PT End of Session - 02/19/19 1155    Visit Number  2    Number of Visits  6    Date for PT Re-Evaluation  03/20/19    PT Start Time  1101    PT Stop Time  1145    PT Time Calculation (min)  44 min    Activity Tolerance  Patient tolerated treatment well    Behavior During Therapy  [facility] for tasks assessed/performed       Past Medical History:  Diagnosis Date  . Allergy   . Anxiety   . Arthritis   . Blood transfusion without reported diagnosis   . Breast cancer ([place]) 07/12/2013   Invasive Mammary Carcinoma  . DVT (deep vein thrombosis) in pregnancy   . Hypertension   . Hypothyroid   . Personal history of radiation therapy   . Pneumonia   . Radiation 11/21/13-01/07/14   Right Breast/Supraclavicular    Past Surgical History:  Procedure Laterality Date  . BREAST LUMPECTOMY Left 2015  . BREAST LUMPECTOMY WITH RADIOACTIVE SEED LOCALIZATION Right 09/09/2013   Procedure: BREAST LUMPECTOMY WITH RADIOACTIVE SEED LOCALIZATION WITH AXILLARY NODE EXCISION;  Surgeon: [name], MD;  Location: [place];  Service: General;  Laterality: Right;  . DENTAL SURGERY  04/19/2012   13 TEETH REMOVED  . DILATION AND CURETTAGE OF UTERUS    . ORIF PERIPROSTHETIC FRACTURE Left 01/31/2017   Procedure: REVISION and OPEN REDUCTION INTERNAL FIXATION (ORIF) PERIPROSTHETIC FRACTURE LEFT HIP;  Surgeon: Paralee Cancel, MD;  [facility];  Service: Orthopedics;  Laterality: Left;  120 mins  . RE-EXCISION OF BREAST LUMPECTOMY Right 09/24/2013   Procedure: RE-EXCISION OF RIGHT BREAST LUMPECTOMY;  Surgeon: [name], MD;  Location: [place];  Service: General;  Laterality: Right;  . TOTAL HIP ARTHROPLASTY Left 01/16/2017   Procedure: TOTAL HIP ARTHROPLASTY POSTERIOR;  Surgeon: Paralee Cancel, MD;  [facility];  Service: Orthopedics;  Laterality: Left;    There were no vitals filed for this visit.  Subjective Assessment - 02/19/19 1150    Subjective  Pt states she got measured for her garment last week and thinks it should be arriving soon. She continues to have multiple complaints of pain in her left hip and leg, right shoulder arm and tingling in hand.  Overall she says she is doing OK    Pertinent History  breast cancer in 2015 with lumpectomy and 4 sentinel nodes with second surgery to get clear margins.  She underwent  radiation.  Multiple complaints of pain in back, left legs neck right  shoulder and arm. She had metastatic bone caner in left hip resuling in 2 hip surgeries.  She had prolonged course of rehab and comes in walking with rolling walker    Currently in Pain?  Yes    Pain Score  --   did not rate                      [facility] Adult PT Treatment/Exercise - 02/19/19 0001      Manual Therapy   Manual Therapy  Manual Lymphatic Drainage (MLD)    Manual Lymphatic Drainage (MLD)  In supine, short neck,  superificial and deep abdominals with diaphragmatic breaths,  left axillary nodes and anterior interaxilary anastamosis, right groin with right axillo-inguinal anastamosis, right chest,right axilla  upper arm , lower arm and hand with return along pathways. the to partial left sidelying for back and posteior interaxllary anstamosis, back to supine for additional work on upper arm and axilla                   PT Long Term Goals - 02/07/19 1308      PT LONG TERM GOAL #1   Title  Pt will be independent in self manual lymph drainage and use of compression to manage her right arm lymphedema at home    Time  6    Period  Weeks    Status  New      PT LONG TERM GOAL #2   Title  Pt will  be independent in HEP for remedial lymphedema exercise and shoulder ROM    Time  6    Period  Weeks    Status  New            Plan - 02/19/19 1156    Clinical Impression Statement  Pt comes to PT for MLD.  She continues to have some swelling visible in forearm, upper arm and axilla, but reported that she felt much better at end of session. Will continue until she gets her garment    Comorbidities  metastatic cancer, arthritis, precious radiation and lymphedema    Stability/Clinical Decision Making  Stable/Uncomplicated    Clinical Impairments Affecting Rehab Potential  previous radiation, 4 nodes removed,  chronic pain limiting her activities     PT Frequency  1x / week    PT Duration  6 weeks    PT Treatment/Interventions  ADLs/Self Care Home Management;Therapeutic exercise;Orthotic Fit/Training;Patient/family education;Scar mobilization;Manual lymph drainage;Manual techniques    PT Next Visit Plan  perform and instruct in self MLD    Consulted and Agree with Plan of Care  Patient       Patient will benefit from skilled therapeutic intervention in order to improve the following deficits and impairments:  Obesity, Increased edema, Increased fascial restricitons, Impaired UE functional use, Pain, Decreased mobility, Difficulty walking, Increased muscle spasms, Decreased range of motion, Impaired perceived functional ability, Postural dysfunction, Impaired flexibility  Visit Diagnosis: Lymphedema, not elsewhere classified  Stiffness of right shoulder joint  Abnormal posture     Problem List Patient Active Problem List   Diagnosis Date Noted  . Morbid obesity with BMI of 40.0-44.9, adult ([place]) 10/31/2018  . Aortic atherosclerosis ([place]) 06/13/2017  . Peri-prosthetic fracture of shaft of femur 01/26/2017  . Pain from bone metastases ([place]) 01/25/2017  . Bone metastases ([place]) 01/25/2017  . Endometrial hyperplasia 01/16/2017  . Lytic bone lesion of left femur 01/15/2017  . Hip  fracture, pathological ([place]) 01/15/2017  . Lymphedema 09/17/2015  . Long term current use of anticoagulant therapy 08/23/2015  . DVT, lower extremity ([place]) 06/18/2015  . Bilateral knee pain 04/03/2015  . Arm edema 08/28/2014  . Vitamin D deficiency 04/23/2014  . Hyperparathyroidism ([place]) 04/23/2014  . Depression 07/18/2013  . Malignant neoplasm of upper-outer quadrant of right breast in female, estrogen receptor positive ([place]) 07/15/2013  . Overactive bladder 01/30/2013  . Primary hypercoagulable state ([name]) 12/19/2012  . HTN (hypertension) 09/27/2011  . Hearing loss 09/27/2011  . Seasonal allergies 09/27/2011  . Hypothyroid 08/26/2011   [name]. Owens Shark, PT  [name] 02/19/2019, 11:58 AM  [place]  [place], [name], [address] Phone: [phone]   Fax:  [phone]  Name: Alicia Vasquez [name] MRN: [id number] Date of Birth: [birth year]

## 2019-02-19 NOTE — Progress Notes (Signed)
Alicia Vasquez  [phone] [phone]     ID: Alicia Vasquez OB: 04-28-1941  MR#: [id number]  GUR#:[id number]  Patient Care Team: Briscoe Deutscher, DO as PCP - General (Family Medicine) Philemon Kingdom, MD as Consulting Physician (Internal Medicine) Caprice Renshaw, MD as Referring Physician (Internal Medicine) Magrinat, [name], MD as Consulting Physician (Oncology) [name], DDS as Consulting Physician (Dentistry) [name], [place] as Consulting Physician (Physician Assistant) [name] Tamala Fothergill, DPM as Consulting Physician (Podiatry) OTHER MD:   CHIEF COMPLAINT: Estrogen receptor positive breast cancer  CURRENT TREATMENT:  Fulvestrant; warfarin   INTERVAL HISTORY: Alicia Vasquez is here today for follow up and treatment of her metastatic estrogen receptor positive breast cancer.  Alicia Vasquez receives fulvestrant every 4 weeks, and is tolerating it well.  She is overdue for her CT scans and bone scan.  She is having a hard time getting central scheduling on the phone, and notes that her phone doesn't have the capability to receive messages.  She last underwent CT chest on 05/29/2018 that showed no evidence of local recurrence or metastatic disease, PET scan showed no evidence of disease as well.    She is taking Warfarin.  INRs have been stable.  Alicia Vasquez denies any easy bruising or bleeding.   REVIEW OF SYSTEMS: Alicia Vasquez's left leg pain is controlled with Norco, Last fill on 01/20/2019, PMP aware reviewed.  She says that if she takes the medication in the morning, her pain is good for a bit of the day.  It really helps with her mobility and she cotnineus to walk around, and do things for herself.  She is also seeing PT for her right arm lymphedema.  She is awaiting a sleeve to come in that she ordered.  She denies any new pain, fevers, chills, chest pain, cough, shortness of breath, palpitations.  She is without headaches, skin lesions, nausea, vomiting,  bowel/bladder changes.  A detailed ROS was otherwise non contributory.    BREAST CANCER HISTORY: From doctor [name]'s intake node 07/24/2013:  "78 y.o. female. Who presented with SOB and had a CT chest performed that revealed a right breast mass. Mammogram/ultrsound on 2/13 showed a mass in the 11 o'clock position in the right breast measuring 1.5 cm. Also noted was a right axillary LN measuring 1.6 cm. MRI not performed. Biopsy of mass and lymph done. 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 Lymph node + for metastatic carcinoma."  [On 09/09/2013 the patient underwent right lumpectomy and sentinel lymph node sampling. This showed ([id number]) multifocal invasive ductal carcinoma, grade 1, the largest lesion measuring 1.8 cm, the second lesion 1.2 cm. One of 4 sentinel lymph nodes was positive, with extracapsular extension. Margins were positive. HER-2 was repeated and was again negative. Further surgery 09/16/2013 obtained clear margins.  Her subsequent history is as detailed below   PAST MEDICAL HISTORY: Past Medical History:  Diagnosis Date   Allergy    Anxiety    Arthritis    Blood transfusion without reported diagnosis    Breast cancer ([place]) 07/12/2013   Invasive Mammary Carcinoma   DVT (deep vein thrombosis) in pregnancy    Hypertension    Hypothyroid    Personal history of radiation therapy    Pneumonia    Radiation 11/21/13-01/07/14   Right Breast/Supraclavicular    PAST SURGICAL HISTORY: Past Surgical History:  Procedure Laterality Date   BREAST LUMPECTOMY  Left 2015   BREAST LUMPECTOMY WITH RADIOACTIVE SEED LOCALIZATION Right 09/09/2013   Procedure: BREAST LUMPECTOMY WITH RADIOACTIVE SEED LOCALIZATION WITH AXILLARY NODE EXCISION;  Surgeon: [name], MD;  Location: [facility];  Service: General;  Laterality: Right;    DENTAL SURGERY  04/19/2012   13 TEETH REMOVED   DILATION AND CURETTAGE OF UTERUS     ORIF PERIPROSTHETIC FRACTURE Left 01/31/2017   Procedure: REVISION and OPEN REDUCTION INTERNAL FIXATION (ORIF) PERIPROSTHETIC FRACTURE LEFT HIP;  Surgeon: Paralee Cancel, MD;  Location: [facility];  Service: Orthopedics;  Laterality: Left;  120 mins   RE-EXCISION OF BREAST LUMPECTOMY Right 09/24/2013   Procedure: RE-EXCISION OF RIGHT BREAST LUMPECTOMY;  Surgeon: [name], MD;  Location: [facility];  Service: General;  Laterality: Right;   TOTAL HIP ARTHROPLASTY Left 01/16/2017   Procedure: TOTAL HIP ARTHROPLASTY POSTERIOR;  Surgeon: Paralee Cancel, MD;  Location: [facility];  Service: Orthopedics;  Laterality: Left;    FAMILY HISTORY Family History  Problem Relation Age of Onset   Heart disease Brother    Colon cancer Brother    Prostate cancer Brother    the patient's father died at the age of 66 after an automobile accident. The patient's mother died at the age of 75. She was a Marine scientist here in [name] in the old [facility]. She was infected with polio and was confined to a wheelchair for a good part of her life. She eventually died of pneumonia. The patient had one brother, who died with prostate cancer. She had no sisters. There is no history of breast or ovarian cancer in the family.   GYNECOLOGIC HISTORY:  Menarche age 89, first live birth age 55, the patient is GX P1. She went through the change of life at age 29. 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 She is widowed. Currently she is staying with her friend Alicia Vasquez,  48, who is a retired Radio producer. The patient's son Alicia Vasquez lives in [place]. He works an Engineer, technical sales. The patient has no grandchildren. She is a Tourist information centre manager but currently attends a [employer] with her friend Alicia Vasquez   ADVANCED DIRECTIVES: Not in place   HEALTH  MAINTENANCE: Social History   Tobacco Use   Smoking status: Never Smoker   Smokeless tobacco: Never Used  Substance Use Topics   Alcohol use: No    Alcohol/week: 0.0 standard drinks   Drug use: No     Colonoscopy: Never  PAP:  Bone density: 09/20/2016 showed a T score of -2.2  Lipid panel:  Allergies  Allergen Reactions   Anesthetics, Amide Hypertension   Benadryl [Diphenhydramine Hcl] Other (See Comments)    Dizziness   Carbocaine [Mepivacaine Hcl] Hypertension   Codeine Other (See Comments)    Dizziness   Epinephrine Hypertension   Sulfa Antibiotics Other (See Comments)    dizziness   Latex Other (See Comments)    Blisters in mouth   Tramadol     Sedation.    Penicillins Rash    Has patient had a PCN reaction causing immediate rash, facial/tongue/throat swelling, SOB or lightheadedness with hypotension: Unknown Has patient had a PCN reaction causing severe rash involving mucus membranes or skin necrosis: Unknown Has patient had a PCN reaction that required hospitalization: Unknown Has patient had a PCN reaction occurring within the last 10 years: Unknown If all of the above answers are "NO", then may  proceed with Cephalosporin use.     Current Outpatient Medications  Medication Sig Dispense Refill   Cholecalciferol (VITAMIN D3) 5000 UNITS TABS Take 5,000 Units by mouth daily.      HYDROcodone-acetaminophen (NORCO/VICODIN) 5-325 MG tablet Take 1 tablet by mouth every 6 (six) hours as needed for moderate pain. 120 tablet 0   ketoconazole (NIZORAL) 2 % cream Apply 1 application topically daily. 15 g 0   SYNTHROID 125 MCG tablet TAKE 1 TABLET BY MOUTH EVERY DAY BEFORE BREAKFAST 30 tablet 2   vitamin C (ASCORBIC ACID) 250 MG tablet Take 250 mg by mouth 3 (three) times daily.     warfarin (COUMADIN) 5 MG tablet TAKE 1.5-2 TABLETS ONE TIME ONLY AT 6 PM FOR 1 DOSE. TAKE 7.5 MG ALTERNATING WITH 10 MG DAILY 270 tablet 1   No current facility-administered  medications for this visit.     OBJECTIVE:  Vitals:   02/20/19 1121  BP: 136/79  Pulse: 76  Resp: 17  Temp: 98.5 F (36.9 C)  SpO2: 98%     Body mass index is 40.54 kg/m.    ECOG FS: 2 - Symptomatic, <50% confined to bed GENERAL: Patient is a well appearing female in no acute distress HEENT:  Sclerae anicteric.  Oropharynx clear and moist. No ulcerations or evidence of oropharyngeal candidiasis. Neck is supple.  NODES: Fullness noted in right axilla.   BREAST EXAM:  Deferred today LUNGS:  Clear to auscultation bilaterally.  No wheezes or rhonchi. HEART:  Regular rate and rhythm. No murmur appreciated. ABDOMEN:  Soft, nontender.  Positive, normoactive bowel sounds. No organomegaly palpated. MSK:  No focal spinal tenderness to palpation. Full range of motion bilaterally in the upper extremities. EXTREMITIES:  + mild right arm swelling SKIN:  Clear with no obvious rashes or skin changes. No nail dyscrasia. NEURO:  Nonfocal. Well oriented.  Appropriate affect.     LAB RESULTS:  CMP     Component Value Date/Time   NA 143 02/20/2019 1042   NA 141 05/16/2017 1416   K 4.0 02/20/2019 1042   K 3.8 05/16/2017 1416   CL 106 02/20/2019 1042   CO2 29 02/20/2019 1042   CO2 27 05/16/2017 1416   GLUCOSE 110 (H) 02/20/2019 1042   GLUCOSE 140 05/16/2017 1416   BUN 15 02/20/2019 1042   BUN 13.2 05/16/2017 1416   CREATININE 0.83 02/20/2019 1042   CREATININE 0.77 03/21/2018 1417   CREATININE 0.80 09/22/2017 1555   CREATININE 0.8 05/16/2017 1416   CALCIUM 10.9 (H) 02/20/2019 1042   CALCIUM 11.0 (H) 05/16/2017 1416   PROT 6.9 02/20/2019 1042   PROT 6.9 05/16/2017 1416   ALBUMIN 3.9 02/20/2019 1042   ALBUMIN 3.6 05/16/2017 1416   AST 13 (L) 02/20/2019 1042   AST 14 (L) 03/21/2018 1417   AST 13 05/16/2017 1416   ALT 13 02/20/2019 1042   ALT 14 03/21/2018 1417   ALT <6 05/16/2017 1416   ALKPHOS 113 02/20/2019 1042   ALKPHOS 130 05/16/2017 1416   BILITOT 0.2 (L) 02/20/2019 1042    BILITOT 0.3 03/21/2018 1417   BILITOT 0.31 05/16/2017 1416   GFRNONAA >60 02/20/2019 1042   GFRNONAA >60 03/21/2018 1417   GFRNONAA 75 08/19/2015 1602   GFRAA >60 02/20/2019 1042   GFRAA >60 03/21/2018 1417   GFRAA 87 08/19/2015 1602    I No results found for: SPEP  Lab Results  Component Value Date   WBC 6.4 02/20/2019   NEUTROABS 4.2 02/20/2019  HGB 12.7 02/20/2019   HCT 40.5 02/20/2019   MCV 90.6 02/20/2019   PLT 219 02/20/2019      Chemistry      Component Value Date/Time   NA 143 02/20/2019 1042   NA 141 05/16/2017 1416   K 4.0 02/20/2019 1042   K 3.8 05/16/2017 1416   CL 106 02/20/2019 1042   CO2 29 02/20/2019 1042   CO2 27 05/16/2017 1416   BUN 15 02/20/2019 1042   BUN 13.2 05/16/2017 1416   CREATININE 0.83 02/20/2019 1042   CREATININE 0.77 03/21/2018 1417   CREATININE 0.80 09/22/2017 1555   CREATININE 0.8 05/16/2017 1416      Component Value Date/Time   CALCIUM 10.9 (H) 02/20/2019 1042   CALCIUM 11.0 (H) 05/16/2017 1416   ALKPHOS 113 02/20/2019 1042   ALKPHOS 130 05/16/2017 1416   AST 13 (L) 02/20/2019 1042   AST 14 (L) 03/21/2018 1417   AST 13 05/16/2017 1416   ALT 13 02/20/2019 1042   ALT 14 03/21/2018 1417   ALT <6 05/16/2017 1416   BILITOT 0.2 (L) 02/20/2019 1042   BILITOT 0.3 03/21/2018 1417   BILITOT 0.31 05/16/2017 1416       No results found for: LABCA2  No components found for: LABCA125  Recent Labs  Lab 02/20/19 1042  INR 2.8*    Urinalysis    Component Value Date/Time   COLORURINE STRAW (A) 02/20/2018 1606   APPEARANCEUR CLEAR 02/20/2018 1606   LABSPEC 1.009 02/20/2018 1606   LABSPEC 1.010 09/22/2016 1553   PHURINE 6.0 02/20/2018 1606   GLUCOSEU NEGATIVE 02/20/2018 1606   GLUCOSEU Negative 09/22/2016 1553   HGBUR NEGATIVE 02/20/2018 1606   BILIRUBINUR Negative 03/16/2018 1433   BILIRUBINUR Negative 09/22/2016 1553   KETONESUR NEGATIVE 02/20/2018 1606   PROTEINUR Negative 03/16/2018 1433   PROTEINUR NEGATIVE  02/20/2018 1606   UROBILINOGEN 0.2 03/16/2018 1433   UROBILINOGEN 0.2 11/15/2016 1358   UROBILINOGEN 0.2 09/22/2016 1553   NITRITE Negative 03/16/2018 1433   NITRITE NEGATIVE 02/20/2018 1606   LEUKOCYTESUR Negative 03/16/2018 1433   LEUKOCYTESUR Negative 09/22/2016 1553    STUDIES: Restaging studies scheduled for later this month  ASSESSMENT: 78 y.o. [place] woman status post right breast upper outer quadrant lumpectomy and sentinel lymph node sampling 09/09/2013 for an mpT1c pN1a, stage IIA invasive ductal carcinoma, estrogen and progesterone receptor both 100% positive with strong staining intensity, MIB-1 of 17% and no HER-2 amplification  (1) additional surgery for margin clearance 09/16/2013 obtained negative margins  (2) Oncotype DX recurrence score of 4 predicts a risk of outside the breast recurrence within 10 years of 7% if the patient's only systemic therapy is tamoxifen for 5 years. It also predicts no benefit from chemotherapy  (3) adjuvant radiation completed 01/07/2014  (4) anastrozole started 02/27/2014 stopped within 2 weeks because of arm swelling.   (a) bone density April 2016 showed osteopenia, with a t-score of -1.6  (b) anastrozole resumed 12/17/2015  (c) Bone density 09/20/2016 showed a T score of -2.2  (5) history of left lower extremity DVT 11/23/2012, initially on rivaroxaban, which caused chest pain, switch to Coumadin July 2014  (b) coumadin dose increased to 7.5/10 mg alternating days as of 06/13/2017   METASTATIC DISEASE: August 2018 (6) status post left total hip replacement 01/16/2017 for estrogen receptor positive adenocarcinoma.  (a) CA 27-29 was 46.4 as of 04/18/2017  (b) chest CT scan 05/02/2017 shows no lung or liver lesion concern; it does show aortic atherosclerosis  (c) baseline  bone scan 05/02/2017 was negative  (d) PET scan 09/12/2017 shows no active disease, including bone  (e) PET scan and CT chest on 05/29/2018 show no active  disease  (7) fulvestrant started 04/18/2017  (8) denosumab/Xgeva started 05/16/2017  (a) changed to every 12-weeks after 10/03/2017 dose   (b) held starting with 06/13/2018 dose due to dental concerns  PLAN:  Alicia Vasquez is doing well today.  Her labs are stable and she has no overt clinical signs of cancer progression.  She will continue on Fulvestrant daily.  She is seeing PT for her right arm swelling.  I would really like to get her scans done, and my nurse Alicia Vasquez called central scheduling today on Alicia Vasquez's behalf to get her scheduled.  She will continue to f/u with PT.    Her pain is under control with her current regimen of Norco and scripts have been filled appropriately.  She will continue this, and knows our goal is to keep her pain managed, but keep her from having increased adverse effects from the medications such as excessive sleepiness or constipation.  She is meeting her goal in this.    Alicia Vasquez will return in 4 weeks for labs, f/u, and her next injection.  She was recommended to keep appropriate pandemic precautions.  She knows to call for any other issue that may develop before that visit.  A total of (30) minutes of face-to-face time was spent with this patient with greater than 50% of that time in counseling and care-coordination.   Alicia Bihari, NP 02/20/19 2:22 PM Medical Oncology and Hematology [facility] [address] [place], [place] [address] Tel. [phone]    Fax. [phone]

## 2019-02-20 ENCOUNTER — Inpatient Hospital Stay: Payer: Medicare Other | Attending: Oncology | Admitting: Adult Health

## 2019-02-20 ENCOUNTER — Inpatient Hospital Stay: Payer: Medicare Other

## 2019-02-20 ENCOUNTER — Other Ambulatory Visit: Payer: Medicare Other

## 2019-02-20 ENCOUNTER — Other Ambulatory Visit: Payer: Self-pay

## 2019-02-20 ENCOUNTER — Telehealth: Payer: Self-pay

## 2019-02-20 ENCOUNTER — Encounter: Payer: Self-pay | Admitting: Adult Health

## 2019-02-20 ENCOUNTER — Ambulatory Visit: Payer: Medicare Other

## 2019-02-20 VITALS — BP 136/79 | HR 76 | Temp 98.5°F | Resp 17 | Ht 64.0 in | Wt 236.2 lb

## 2019-02-20 DIAGNOSIS — Z79818 Long term (current) use of other agents affecting estrogen receptors and estrogen levels: Secondary | ICD-10-CM | POA: Diagnosis not present

## 2019-02-20 DIAGNOSIS — Z8042 Family history of malignant neoplasm of prostate: Secondary | ICD-10-CM | POA: Diagnosis not present

## 2019-02-20 DIAGNOSIS — E039 Hypothyroidism, unspecified: Secondary | ICD-10-CM | POA: Insufficient documentation

## 2019-02-20 DIAGNOSIS — Z79899 Other long term (current) drug therapy: Secondary | ICD-10-CM | POA: Insufficient documentation

## 2019-02-20 DIAGNOSIS — G893 Neoplasm related pain (acute) (chronic): Secondary | ICD-10-CM

## 2019-02-20 DIAGNOSIS — M8580 Other specified disorders of bone density and structure, unspecified site: Secondary | ICD-10-CM | POA: Insufficient documentation

## 2019-02-20 DIAGNOSIS — Z170 Estrogen receptor positive status [ER+]: Secondary | ICD-10-CM | POA: Diagnosis not present

## 2019-02-20 DIAGNOSIS — M1990 Unspecified osteoarthritis, unspecified site: Secondary | ICD-10-CM | POA: Diagnosis not present

## 2019-02-20 DIAGNOSIS — Z86718 Personal history of other venous thrombosis and embolism: Secondary | ICD-10-CM | POA: Insufficient documentation

## 2019-02-20 DIAGNOSIS — R600 Localized edema: Secondary | ICD-10-CM | POA: Insufficient documentation

## 2019-02-20 DIAGNOSIS — M79605 Pain in left leg: Secondary | ICD-10-CM | POA: Diagnosis not present

## 2019-02-20 DIAGNOSIS — C7951 Secondary malignant neoplasm of bone: Secondary | ICD-10-CM | POA: Diagnosis not present

## 2019-02-20 DIAGNOSIS — I700 Atherosclerosis of aorta: Secondary | ICD-10-CM

## 2019-02-20 DIAGNOSIS — M84659P Pathological fracture in other disease, hip, unspecified, subsequent encounter for fracture with malunion: Secondary | ICD-10-CM

## 2019-02-20 DIAGNOSIS — I10 Essential (primary) hypertension: Secondary | ICD-10-CM | POA: Diagnosis not present

## 2019-02-20 DIAGNOSIS — M84459A Pathological fracture, hip, unspecified, initial encounter for fracture: Secondary | ICD-10-CM

## 2019-02-20 DIAGNOSIS — C50411 Malignant neoplasm of upper-outer quadrant of right female breast: Secondary | ICD-10-CM | POA: Diagnosis not present

## 2019-02-20 DIAGNOSIS — F419 Anxiety disorder, unspecified: Secondary | ICD-10-CM | POA: Insufficient documentation

## 2019-02-20 DIAGNOSIS — Z923 Personal history of irradiation: Secondary | ICD-10-CM | POA: Diagnosis not present

## 2019-02-20 DIAGNOSIS — Z7901 Long term (current) use of anticoagulants: Secondary | ICD-10-CM | POA: Diagnosis not present

## 2019-02-20 DIAGNOSIS — I82401 Acute embolism and thrombosis of unspecified deep veins of right lower extremity: Secondary | ICD-10-CM

## 2019-02-20 DIAGNOSIS — D6859 Other primary thrombophilia: Secondary | ICD-10-CM

## 2019-02-20 DIAGNOSIS — M899 Disorder of bone, unspecified: Secondary | ICD-10-CM

## 2019-02-20 LAB — COMPREHENSIVE METABOLIC PANEL
ALT: 13 U/L (ref 0–44)
AST: 13 U/L — ABNORMAL LOW (ref 15–41)
Albumin: 3.9 g/dL (ref 3.5–5.0)
Alkaline Phosphatase: 113 U/L (ref 38–126)
Anion gap: 8 (ref 5–15)
BUN: 15 mg/dL (ref 8–23)
CO2: 29 mmol/L (ref 22–32)
Calcium: 10.9 mg/dL — ABNORMAL HIGH (ref 8.9–10.3)
Chloride: 106 mmol/L (ref 98–111)
Creatinine, Ser: 0.83 mg/dL (ref 0.44–1.00)
GFR calc Af Amer: >60 mL/min (ref >60)
GFR calc non Af Amer: >60 mL/min (ref >60)
Glucose, Bld: 110 mg/dL — ABNORMAL HIGH (ref 70–99)
Potassium: 4 mmol/L (ref 3.5–5.1)
Sodium: 143 mmol/L (ref 135–145)
Total Bilirubin: 0.2 mg/dL — ABNORMAL LOW (ref 0.3–1.2)
Total Protein: 6.9 g/dL (ref 6.5–8.1)

## 2019-02-20 LAB — CBC WITH DIFFERENTIAL/PLATELET
Abs Immature Granulocytes: 0.02 10*3/uL (ref 0.00–0.07)
Basophils Absolute: 0.1 10*3/uL (ref 0.0–0.1)
Basophils Relative: 1 %
Eosinophils Absolute: 0.2 10*3/uL (ref 0.0–0.5)
Eosinophils Relative: 3 %
HCT: 40.5 % (ref 36.0–46.0)
Hemoglobin: 12.7 g/dL (ref 12.0–15.0)
Immature Granulocytes: 0 %
Lymphocytes Relative: 17 %
Lymphs Abs: 1.1 10*3/uL (ref 0.7–4.0)
MCH: 28.4 pg (ref 26.0–34.0)
MCHC: 31.4 g/dL (ref 30.0–36.0)
MCV: 90.6 fL (ref 80.0–100.0)
Monocytes Absolute: 0.9 10*3/uL (ref 0.1–1.0)
Monocytes Relative: 14 %
Neutro Abs: 4.2 10*3/uL (ref 1.7–7.7)
Neutrophils Relative %: 65 %
Platelets: 219 10*3/uL (ref 150–400)
RBC: 4.47 MIL/uL (ref 3.87–5.11)
RDW: 15 % (ref 11.5–15.5)
WBC: 6.4 10*3/uL (ref 4.0–10.5)
nRBC: 0 % (ref 0.0–0.2)

## 2019-02-20 LAB — PROTIME-INR
INR: 2.8 — ABNORMAL HIGH (ref 0.8–1.2)
Prothrombin Time: 29.3 seconds — ABNORMAL HIGH (ref 11.4–15.2)

## 2019-02-20 MED ORDER — FULVESTRANT 250 MG/5ML IM SOLN
INTRAMUSCULAR | Status: AC
  Filled 2019-02-20: qty 5

## 2019-02-20 MED ORDER — FULVESTRANT 250 MG/5ML IM SOLN
500.0000 mg | Freq: Once | INTRAMUSCULAR | Status: AC
  Administered 2019-02-20: 500 mg via INTRAMUSCULAR

## 2019-02-20 NOTE — Telephone Encounter (Signed)
Bone scan and Chest CT scheduled for 03/05/19 through central scheduling.

## 2019-02-20 NOTE — Patient Instructions (Signed)
Fulvestrant injection What is this medicine? FULVESTRANT (ful VES trant) blocks the effects of estrogen. It is used to treat breast cancer. This medicine may be used for other purposes; ask your health care provider or pharmacist if you have questions. COMMON BRAND NAME(S): FASLODEX What should I tell my health care provider before I take this medicine? They need to know if you have any of these conditions:  bleeding disorders  liver disease  low blood counts, like low white cell, platelet, or red cell counts  an unusual or allergic reaction to fulvestrant, other medicines, foods, dyes, or preservatives  pregnant or trying to get pregnant  breast-feeding How should I use this medicine? This medicine is for injection into a muscle. It is usually given by a health care professional in a hospital or clinic setting. Talk to your pediatrician regarding the use of this medicine in children. Special care may be needed. Overdosage: If you think you have taken too much of this medicine contact a poison control center or emergency room at once. NOTE: This medicine is only for you. Do not share this medicine with others. What if I miss a dose? It is important not to miss your dose. Call your doctor or health care professional if you are unable to keep an appointment. What may interact with this medicine?  medicines that treat or prevent blood clots like warfarin, enoxaparin, dalteparin, apixaban, dabigatran, and rivaroxaban This list may not describe all possible interactions. Give your health care provider a list of all the medicines, herbs, non-prescription drugs, or dietary supplements you use. Also tell them if you smoke, drink alcohol, or use illegal drugs. Some items may interact with your medicine. What should I watch for while using this medicine? Your condition will be monitored carefully while you are receiving this medicine. You will need important blood work done while you are taking  this medicine. Do not become pregnant while taking this medicine or for at least 1 year after stopping it. Women of child-bearing potential will need to have a negative pregnancy test before starting this medicine. Women should inform their doctor if they wish to become pregnant or think they might be pregnant. There is a potential for serious side effects to an unborn child. Men should inform their doctors if they wish to father a child. This medicine may lower sperm counts. Talk to your health care professional or pharmacist for more information. Do not breast-feed an infant while taking this medicine or for 1 year after the last dose. What side effects may I notice from receiving this medicine? Side effects that you should report to your doctor or health care professional as soon as possible:  allergic reactions like skin rash, itching or hives, swelling of the face, lips, or tongue  feeling faint or lightheaded, falls  pain, tingling, numbness, or weakness in the legs  signs and symptoms of infection like fever or chills; cough; flu-like symptoms; sore throat  vaginal bleeding Side effects that usually do not require medical attention (report to your doctor or health care professional if they continue or are bothersome):  aches, pains  constipation  diarrhea  headache  hot flashes  nausea, vomiting  pain at site where injected  stomach pain This list may not describe all possible side effects. Call your doctor for medical advice about side effects. You may report side effects to FDA at 1-800-FDA-1088. Where should I keep my medicine? This drug is given in a hospital or clinic and will   not be stored at home. NOTE: This sheet is a summary. It may not cover all possible information. If you have questions about this medicine, talk to your doctor, pharmacist, or health care provider.  2020 Elsevier/Gold Standard (2017-08-24 11:34:41) Coronavirus (COVID-19) Are you at risk?  Are  you at risk for the Coronavirus (COVID-19)?  To be considered HIGH RISK for Coronavirus (COVID-19), you have to meet the following criteria:  . Traveled to [place], [place], [name], [place] or [place]; or in the [place] to [place], [place], [place], or [place]; and have fever, cough, and shortness of breath within the last 2 weeks of travel OR . Been in close contact with a person diagnosed with COVID-19 within the last 2 weeks and have fever, cough, and shortness of breath . IF YOU DO NOT MEET THESE CRITERIA, YOU ARE CONSIDERED LOW RISK FOR COVID-19.  What to do if you are HIGH RISK for COVID-19?  . If you are having a medical emergency, call 911. . Seek medical care right away. Before you go to a doctor's office, urgent care or emergency department, call ahead and tell them about your recent travel, contact with someone diagnosed with COVID-19, and your symptoms. You should receive instructions from your physician's office regarding next steps of care.  . When you arrive at healthcare provider, tell the healthcare staff immediately you have returned from visiting [place], [place], [place], [place] or [name]; or traveled in the [place] to [place], [place], [place], or [place]; in the last two weeks or you have been in close contact with a person diagnosed with COVID-19 in the last 2 weeks.   . Tell the health care staff about your symptoms: fever, cough and shortness of breath. . After you have been seen by a medical provider, you will be either: o Tested for (COVID-19) and discharged home on quarantine except to seek medical care if symptoms worsen, and asked to  - Stay home and avoid contact with others until you get your results (4-5 days)  - Avoid travel on public transportation if possible (such as bus, train, or airplane) or o Sent to the Emergency Department by EMS for evaluation, COVID-19 testing, and possible admission depending on your condition and test  results.  What to do if you are LOW RISK for COVID-19?  Reduce your risk of any infection by using the same precautions used for avoiding the common cold or flu:  . Wash your hands often with soap and warm water for at least 20 seconds.  If soap and water are not readily available, use an alcohol-based hand sanitizer with at least 60% alcohol.  . If coughing or sneezing, cover your mouth and nose by coughing or sneezing into the elbow areas of your shirt or coat, into a tissue or into your sleeve (not your hands). . Avoid shaking hands with others and consider head nods or verbal greetings only. . Avoid touching your eyes, nose, or mouth with unwashed hands.  . Avoid close contact with people who are sick. . Avoid places or events with large numbers of people in one location, like concerts or sporting events. . Carefully consider travel plans you have or are making. . If you are planning any travel outside or inside the [place], visit the CDC's Travelers' Health webpage for the latest health notices. . If you have some symptoms but not all symptoms, continue to monitor at home and seek medical attention if your symptoms   worsen. . If you are having a medical emergency, call 911.   ADDITIONAL HEALTHCARE OPTIONS FOR PATIENTS  [facility] / e-Visit: [email or web address]         [facility]: [phone]  [facility]: [phone]                   [facility]: [phone]   

## 2019-02-21 ENCOUNTER — Telehealth: Payer: Self-pay | Admitting: Oncology

## 2019-02-21 LAB — CANCER ANTIGEN 27.29: CA 27.29: 29.1 U/mL (ref 0.0–38.6)

## 2019-02-21 NOTE — Telephone Encounter (Signed)
I left a message regarding schedule  

## 2019-02-26 ENCOUNTER — Other Ambulatory Visit: Payer: Self-pay

## 2019-02-26 ENCOUNTER — Encounter: Payer: Self-pay | Admitting: Physical Therapy

## 2019-02-26 ENCOUNTER — Ambulatory Visit: Payer: Medicare Other | Admitting: Physical Therapy

## 2019-02-26 DIAGNOSIS — I890 Lymphedema, not elsewhere classified: Secondary | ICD-10-CM

## 2019-02-26 DIAGNOSIS — R293 Abnormal posture: Secondary | ICD-10-CM

## 2019-02-26 DIAGNOSIS — M25611 Stiffness of right shoulder, not elsewhere classified: Secondary | ICD-10-CM

## 2019-02-26 NOTE — Therapy (Signed)
[place], [name], [address] Phone: [phone]   Fax:  [phone]  Physical Therapy Treatment  Patient Details  Name: Rachana Rebstock [facility] MRN: [id number] Date of Birth: [birth year] Referring Provider (PT): Wilber Bihari    Encounter Date: 02/26/2019  PT End of Session - 02/26/19 1343    Visit Number  3    Number of Visits  6    Date for PT Re-Evaluation  03/20/19    PT Start Time  1112   pt arrived late   PT Stop Time  1150    PT Time Calculation (min)  38 min    Activity Tolerance  Patient tolerated treatment well    Behavior During Therapy  [facility] for tasks assessed/performed       Past Medical History:  Diagnosis Date  . Allergy   . Anxiety   . Arthritis   . Blood transfusion without reported diagnosis   . Breast cancer ([place]) 07/12/2013   Invasive Mammary Carcinoma  . DVT (deep vein thrombosis) in pregnancy   . Hypertension   . Hypothyroid   . Personal history of radiation therapy   . Pneumonia   . Radiation 11/21/13-01/07/14   Right Breast/Supraclavicular    Past Surgical History:  Procedure Laterality Date  . BREAST LUMPECTOMY Left 2015  . BREAST LUMPECTOMY WITH RADIOACTIVE SEED LOCALIZATION Right 09/09/2013   Procedure: BREAST LUMPECTOMY WITH RADIOACTIVE SEED LOCALIZATION WITH AXILLARY NODE EXCISION;  Surgeon: [name], MD;  Location: [place];  Service: General;  Laterality: Right;  . DENTAL SURGERY  04/19/2012   13 TEETH REMOVED  . DILATION AND CURETTAGE OF UTERUS    . ORIF PERIPROSTHETIC FRACTURE Left 01/31/2017   Procedure: REVISION and OPEN REDUCTION INTERNAL FIXATION (ORIF) PERIPROSTHETIC FRACTURE LEFT HIP;  Surgeon: Paralee Cancel, MD;  Location[facility];  Service: Orthopedics;  Laterality: Left;  120 mins  . RE-EXCISION OF BREAST LUMPECTOMY Right 09/24/2013   Procedure: RE-EXCISION OF RIGHT BREAST LUMPECTOMY;  Surgeon: [name], MD;  Location:  [place];  Service: General;  Laterality: Right;  . TOTAL HIP ARTHROPLASTY Left 01/16/2017   Procedure: TOTAL HIP ARTHROPLASTY POSTERIOR;  Surgeon: Paralee Cancel, MD;  [facility];  Service: Orthopedics;  Laterality: Left;    There were no vitals filed for this visit.  Subjective Assessment - 02/26/19 1341    Subjective  Tymika said she got her garments in the mail.  She continues to have her ususal complaints, but seems to be doing well.    Pertinent History  breast cancer in 2015 with lumpectomy and 4 sentinel nodes with second surgery to get clear margins.  She underwent  radiation.  Multiple complaints of pain in back, left legs neck right  shoulder and arm. She had metastatic bone caner in left hip resuling in 2 hip surgeries.  She had prolonged course of rehab and comes in walking with rolling walker    Currently in Pain?  Yes    Pain Score  --   did not rate , multiple complaints           LYMPHEDEMA/ONCOLOGY QUESTIONNAIRE - 02/26/19 1131      Right Upper Extremity Lymphedema   15 cm Proximal to Olecranon Process  40 cm    10 cm Proximal to Olecranon Process  38 cm    Olecranon Process  29 cm    15 cm Proximal to Ulnar Styloid Process  27.2 cm    10  cm Proximal to Ulnar Styloid Process  24 cm    Just Proximal to Ulnar Styloid Process  17 cm    Across Hand at [employer]  195 cm    At [place] of 2nd Digit  6.3 cm                [facility] Adult PT Treatment/Exercise - 02/26/19 0001      Manual Therapy   Manual Therapy  Edema management;Manual Lymphatic Drainage (MLD)    Manual therapy comments  remeasured arm today     Edema Management  pt brings in a medi glove and ready wrap.  She demonstrates that she can don and doff on her own after instruction. and it appears to fit well.  Pt is pleased with fit  and says that it feels good     Manual Lymphatic Drainage (MLD)  In supine, short neck, superificial and deep abdominals with diaphragmatic  breaths,  left axillary nodes and anterior interaxilary anastamosis, right groin with right axillo-inguinal anastamosis, right chest,right axilla  upper arm , lower arm and hand with return along pathways. , extra time spent on upper arm                   PT Long Term Goals - 02/07/19 1308      PT LONG TERM GOAL #1   Title  Pt will be independent in self manual lymph drainage and use of compression to manage her right arm lymphedema at home    Time  6    Period  Weeks    Status  New      PT LONG TERM GOAL #2   Title  Pt will be independent in HEP for remedial lymphedema exercise and shoulder ROM    Time  6    Period  Weeks    Status  New            Plan - 02/26/19 1344    Clinical Impression Statement  Pt received her garments and she is pleased with them.  She is able to don and doff them independently and says she will wear them daily ( taking them off at night )  until next session  Pt leaves this session with garments intact. She has had minimum reduction at 2 places of lower arm, but upper arm remains full compared to right. Enccouraged pt to continue to be as active as possible    Comorbidities  metastatic cancer, arthritis, precious radiation and lymphedema    Clinical Impairments Affecting Rehab Potential  previous radiation, 4 nodes removed,  chronic pain limiting her activities     PT Frequency  1x / week    PT Duration  6 weeks    PT Treatment/Interventions  ADLs/Self Care Home Management;Therapeutic exercise;Orthotic Fit/Training;Patient/family education;Scar mobilization;Manual lymph drainage;Manual techniques    PT Next Visit Plan  remeasure and compare measurements to see if she is able to manage her lymphedema at home    Consulted and Agree with Plan of Care  Patient       Patient will benefit from skilled therapeutic intervention in order to improve the following deficits and impairments:  Obesity, Increased edema, Increased fascial restricitons,  Impaired UE functional use, Pain, Decreased mobility, Difficulty walking, Increased muscle spasms, Decreased range of motion, Impaired perceived functional ability, Postural dysfunction, Impaired flexibility  Visit Diagnosis: Lymphedema, not elsewhere classified  Abnormal posture  Stiffness of right shoulder joint     Problem List Patient Active  Problem List   Diagnosis Date Noted  . Morbid obesity with BMI of 40.0-44.9, adult ([place]) 10/31/2018  . Aortic atherosclerosis ([place]) 06/13/2017  . Peri-prosthetic fracture of shaft of femur 01/26/2017  . Pain from bone metastases ([place]) 01/25/2017  . Bone metastases ([place]) 01/25/2017  . Endometrial hyperplasia 01/16/2017  . Lytic bone lesion of left femur 01/15/2017  . Hip fracture, pathological ([place]) 01/15/2017  . Lymphedema 09/17/2015  . Long term current use of anticoagulant therapy 08/23/2015  . DVT, lower extremity ([place]) 06/18/2015  . Bilateral knee pain 04/03/2015  . Arm edema 08/28/2014  . Vitamin D deficiency 04/23/2014  . Hyperparathyroidism ([place]) 04/23/2014  . Depression 07/18/2013  . Malignant neoplasm of upper-outer quadrant of right breast in female, estrogen receptor positive ([place]) 07/15/2013  . Overactive bladder 01/30/2013  . Primary hypercoagulable state ([place]) 12/19/2012  . HTN (hypertension) 09/27/2011  . Hearing loss 09/27/2011  . Seasonal allergies 09/27/2011  . Hypothyroid 08/26/2011   [name]. Owens Shark PT  [name] 02/26/2019, 1:51 PM  [place], [name], [address] Phone: [phone]   Fax:  [phone]  Name: Cary Vandervort [facility] MRN: [id number] Date of Birth: [birth year]

## 2019-03-05 ENCOUNTER — Encounter: Payer: Self-pay | Admitting: Physical Therapy

## 2019-03-05 ENCOUNTER — Other Ambulatory Visit: Payer: Self-pay

## 2019-03-05 ENCOUNTER — Inpatient Hospital Stay (HOSPITAL_COMMUNITY): Admission: RE | Admit: 2019-03-05 | Payer: Medicare Other | Source: Ambulatory Visit

## 2019-03-05 ENCOUNTER — Other Ambulatory Visit (HOSPITAL_COMMUNITY): Payer: Medicare Other

## 2019-03-05 ENCOUNTER — Ambulatory Visit: Payer: Medicare Other | Attending: Adult Health | Admitting: Physical Therapy

## 2019-03-05 DIAGNOSIS — M25611 Stiffness of right shoulder, not elsewhere classified: Secondary | ICD-10-CM | POA: Insufficient documentation

## 2019-03-05 DIAGNOSIS — R293 Abnormal posture: Secondary | ICD-10-CM | POA: Insufficient documentation

## 2019-03-05 DIAGNOSIS — I890 Lymphedema, not elsewhere classified: Secondary | ICD-10-CM | POA: Diagnosis not present

## 2019-03-05 NOTE — Therapy (Signed)
[place], [name], [address] Phone: [phone]   Fax:  [phone]  Physical Therapy Treatment  Patient Details  Name: Alicia Vasquez MRN: [id number] Date of Birth: 01-09-1941 Referring Provider (PT): Wilber Bihari    Encounter Date: 03/05/2019  PT End of Session - 03/05/19 1452    Visit Number  4    Number of Visits  6    Date for PT Re-Evaluation  03/20/19    PT Start Time  1406    PT Stop Time  1450    PT Time Calculation (min)  44 min    Behavior During Therapy  [facility] for tasks assessed/performed       Past Medical History:  Diagnosis Date  . Allergy   . Anxiety   . Arthritis   . Blood transfusion without reported diagnosis   . Breast cancer ([place]) 07/12/2013   Invasive Mammary Carcinoma  . DVT (deep vein thrombosis) in pregnancy   . Hypertension   . Hypothyroid   . Personal history of radiation therapy   . Pneumonia   . Radiation 11/21/13-01/07/14   Right Breast/Supraclavicular    Past Surgical History:  Procedure Laterality Date  . BREAST LUMPECTOMY Left 2015  . BREAST LUMPECTOMY WITH RADIOACTIVE SEED LOCALIZATION Right 09/09/2013   Procedure: BREAST LUMPECTOMY WITH RADIOACTIVE SEED LOCALIZATION WITH AXILLARY NODE EXCISION;  Surgeon: [name], MD;  Location: [place];  Service: General;  Laterality: Right;  . DENTAL SURGERY  04/19/2012   13 TEETH REMOVED  . DILATION AND CURETTAGE OF UTERUS    . ORIF PERIPROSTHETIC FRACTURE Left 01/31/2017   Procedure: REVISION and OPEN REDUCTION INTERNAL FIXATION (ORIF) PERIPROSTHETIC FRACTURE LEFT HIP;  Surgeon: Paralee Cancel, MD;  [facility];  Service: Orthopedics;  Laterality: Left;  120 mins  . RE-EXCISION OF BREAST LUMPECTOMY Right 09/24/2013   Procedure: RE-EXCISION OF RIGHT BREAST LUMPECTOMY;  Surgeon: [name], MD;  Location: [place];  Service: General;  Laterality: Right;  . TOTAL  HIP ARTHROPLASTY Left 01/16/2017   Procedure: TOTAL HIP ARTHROPLASTY POSTERIOR;  Surgeon: Paralee Cancel, MD;  [facility];  Service: Orthopedics;  Laterality: Left;    There were no vitals filed for this visit.  Subjective Assessment - 03/05/19 1449    Subjective  Pt comes in wearing compression glove and ready wrap that appears to be slid down. She said she has been wearing it about 4 hours a day    Pertinent History  breast cancer in 2015 with lumpectomy and 4 sentinel nodes with second surgery to get clear margins.  She underwent  radiation.  Multiple complaints of pain in back, left legs neck right  shoulder and arm. She had metastatic bone caner in left hip resuling in 2 hip surgeries.  She had prolonged course of rehab and comes in walking with rolling walker    Currently in Pain?  Yes    Pain Score  --   did not rate, multiple compaints of pain as usual   Pain Onset  More than a month ago            LYMPHEDEMA/ONCOLOGY QUESTIONNAIRE - 03/05/19 1412      Right Upper Extremity Lymphedema   15 cm Proximal to Olecranon Process  40 cm    10 cm Proximal to Olecranon Process  37.5 cm    Olecranon Process  28.2 cm    15 cm Proximal to Ulnar Styloid Process  27.5 cm  10 cm Proximal to Ulnar Styloid Process  24.2 cm    Just Proximal to Ulnar Styloid Process  17 cm    Across Hand at [employer]  19.5 cm    At [place] of 2nd Digit  6.2 cm                [facility] Adult PT Treatment/Exercise - 03/05/19 0001      Manual Therapy   Edema Management  reviewed how to apply ready wrap and make sure she gets it all the way up to upper arm Pt able to do it herself with only  min assist to get top strap at upper arm  She feels that she will be able to direct Curt Bears to be able to help her with this.     Manual Lymphatic Drainage (MLD)  In supine, short neck, superificial and deep abdominals with diaphragmatic breaths,  left axillary nodes and anterior interaxilary anastamosis,  right groin with right axillo-inguinal anastamosis, right chest,right axilla  upper arm , lower arm and hand with return along pathways. , extra time spent on upper arm                   PT Long Term Goals - 03/05/19 1456      PT LONG TERM GOAL #1   Title  Pt will be independent in self manual lymph drainage and use of compression to manage her right arm lymphedema at home    Baseline  pt had received compression garments and is able to apply them    Status  Achieved      PT LONG TERM GOAL #2   Title  Pt will be independent in HEP for remedial lymphedema exercise and shoulder ROM    Baseline  Pt states she knows how to do exercises for her shoulder since she has had so much PT    Status  Achieved            Plan - 03/05/19 1452    Clinical Impression Statement  Pt has had no signficant change in measurement while wearing compression garments and her complaints of pain have remained stable.  She has received compression garments and knows how to apply then to control her lymphedema.  She has a difficult time with transportation.  She feels that she is able to manage at home with activities at daily living and can use the compression glove and ready wrap to manage her lymphedma to keep it at this level.  She feels that she is ready to discharge from PT    Clinical Impairments Affecting Rehab Potential  previous radiation, 4 nodes removed,  chronic pain limiting her activities     PT Next Visit Plan  Discharge       Patient will benefit from skilled therapeutic intervention in order to improve the following deficits and impairments:  Obesity, Increased edema, Increased fascial restricitons, Impaired UE functional use, Pain, Decreased mobility, Difficulty walking, Increased muscle spasms, Decreased range of motion, Impaired perceived functional ability, Postural dysfunction, Impaired flexibility  Visit Diagnosis: Lymphedema, not elsewhere classified  Abnormal  posture  Stiffness of right shoulder joint     Problem List Patient Active Problem List   Diagnosis Date Noted  . Morbid obesity with BMI of 40.0-44.9, adult ([place]) 10/31/2018  . Aortic atherosclerosis ([place]) 06/13/2017  . Peri-prosthetic fracture of shaft of femur 01/26/2017  . Pain from bone metastases ([place]) 01/25/2017  . Bone metastases ([place]) 01/25/2017  . Endometrial  hyperplasia 01/16/2017  . Lytic bone lesion of left femur 01/15/2017  . Hip fracture, pathological ([place]) 01/15/2017  . Lymphedema 09/17/2015  . Long term current use of anticoagulant therapy 08/23/2015  . DVT, lower extremity ([place]) 06/18/2015  . Bilateral knee pain 04/03/2015  . Arm edema 08/28/2014  . Vitamin D deficiency 04/23/2014  . Hyperparathyroidism ([place]) 04/23/2014  . Depression 07/18/2013  . Malignant neoplasm of upper-outer quadrant of right breast in female, estrogen receptor positive ([place]) 07/15/2013  . Overactive bladder 01/30/2013  . Primary hypercoagulable state ([place]) 12/19/2012  . HTN (hypertension) 09/27/2011  . Hearing loss 09/27/2011  . Seasonal allergies 09/27/2011  . Hypothyroid 08/26/2011     PHYSICAL THERAPY DISCHARGE SUMMARY  Visits from Start of Care: 4  Current functional level related to goals / functional outcomes: As above    Remaining deficits: Lymphedema in left arm and chronic pain   Education / Equipment: Use of compression garments  Plan: Patient agrees to discharge.  Patient goals were met. Patient is being discharged due to meeting the stated rehab goals.  ?????     [name]. Owens Shark PT  [name] 03/05/2019, 2:58 PM  [place], [name], [address] Phone: [phone]   Fax:  [phone]  Name: Alicia Vasquez MRN: [id number] Date of Birth: [birth year]

## 2019-03-06 ENCOUNTER — Telehealth: Payer: Self-pay

## 2019-03-06 NOTE — Telephone Encounter (Signed)
Pt called today for refill on Hydrocodone-acetaminophen prescription request given to Dr. Jana Hakim

## 2019-03-07 ENCOUNTER — Other Ambulatory Visit: Payer: Self-pay | Admitting: Oncology

## 2019-03-07 DIAGNOSIS — E039 Hypothyroidism, unspecified: Secondary | ICD-10-CM

## 2019-03-08 ENCOUNTER — Ambulatory Visit: Payer: Medicare Other | Admitting: Podiatry

## 2019-03-12 ENCOUNTER — Other Ambulatory Visit: Payer: Self-pay | Admitting: Oncology

## 2019-03-12 DIAGNOSIS — M84559P Pathological fracture in neoplastic disease, hip, unspecified, subsequent encounter for fracture with malunion: Secondary | ICD-10-CM

## 2019-03-12 MED ORDER — HYDROCODONE-ACETAMINOPHEN 5-325 MG PO TABS: 1.0000 | ORAL_TABLET | Freq: Four times a day (QID) | ORAL | 0 refills | Status: DC | PRN

## 2019-03-18 ENCOUNTER — Other Ambulatory Visit: Payer: Self-pay

## 2019-03-18 ENCOUNTER — Encounter (HOSPITAL_COMMUNITY)
Admission: RE | Admit: 2019-03-18 | Discharge: 2019-03-18 | Disposition: A | Discharge status: Home / Self Care | Payer: Medicare Other | Source: Ambulatory Visit | Attending: Adult Health | Admitting: Adult Health

## 2019-03-18 ENCOUNTER — Encounter (HOSPITAL_COMMUNITY): Payer: Self-pay

## 2019-03-18 ENCOUNTER — Ambulatory Visit (HOSPITAL_COMMUNITY)
Admission: RE | Admit: 2019-03-18 | Discharge: 2019-03-18 | Disposition: A | Discharge status: Home / Self Care | Payer: Medicare Other | Source: Ambulatory Visit | Attending: Adult Health | Admitting: Adult Health

## 2019-03-18 DIAGNOSIS — C7951 Secondary malignant neoplasm of bone: Secondary | ICD-10-CM | POA: Diagnosis not present

## 2019-03-18 DIAGNOSIS — Z170 Estrogen receptor positive status [ER+]: Secondary | ICD-10-CM

## 2019-03-18 DIAGNOSIS — C50411 Malignant neoplasm of upper-outer quadrant of right female breast: Secondary | ICD-10-CM

## 2019-03-18 DIAGNOSIS — C50919 Malignant neoplasm of unspecified site of unspecified female breast: Secondary | ICD-10-CM | POA: Diagnosis not present

## 2019-03-18 DIAGNOSIS — C50911 Malignant neoplasm of unspecified site of right female breast: Secondary | ICD-10-CM | POA: Diagnosis not present

## 2019-03-18 HISTORY — DX: Secondary malignant neoplasm of bone: C79.51

## 2019-03-18 MED ORDER — IOHEXOL 300 MG/ML  SOLN
75.0000 mL | Freq: Once | INTRAMUSCULAR | Status: AC | PRN
  Administered 2019-03-18: 75 mL via INTRAVENOUS

## 2019-03-18 MED ORDER — TECHNETIUM TC 99M MEDRONATE IV KIT
21.1000 | PACK | Freq: Once | INTRAVENOUS | Status: AC | PRN
  Administered 2019-03-18: 21.1 via INTRAVENOUS

## 2019-03-18 MED ORDER — SODIUM CHLORIDE (PF) 0.9 % IJ SOLN
INTRAMUSCULAR | Status: AC
  Filled 2019-03-18: qty 50

## 2019-03-20 ENCOUNTER — Other Ambulatory Visit: Payer: Self-pay

## 2019-03-20 ENCOUNTER — Inpatient Hospital Stay: Payer: Medicare Other | Attending: Oncology

## 2019-03-20 ENCOUNTER — Other Ambulatory Visit: Payer: Medicare Other

## 2019-03-20 ENCOUNTER — Inpatient Hospital Stay: Payer: Medicare Other

## 2019-03-20 ENCOUNTER — Inpatient Hospital Stay (HOSPITAL_BASED_OUTPATIENT_CLINIC_OR_DEPARTMENT_OTHER): Payer: Medicare Other | Admitting: Oncology

## 2019-03-20 VITALS — BP 169/71 | HR 76 | Temp 98.7°F | Resp 18 | Ht 64.0 in | Wt 234.3 lb

## 2019-03-20 DIAGNOSIS — C7951 Secondary malignant neoplasm of bone: Secondary | ICD-10-CM

## 2019-03-20 DIAGNOSIS — Z923 Personal history of irradiation: Secondary | ICD-10-CM | POA: Diagnosis not present

## 2019-03-20 DIAGNOSIS — Z7901 Long term (current) use of anticoagulants: Secondary | ICD-10-CM | POA: Diagnosis not present

## 2019-03-20 DIAGNOSIS — Z170 Estrogen receptor positive status [ER+]: Secondary | ICD-10-CM

## 2019-03-20 DIAGNOSIS — C50411 Malignant neoplasm of upper-outer quadrant of right female breast: Secondary | ICD-10-CM | POA: Diagnosis not present

## 2019-03-20 DIAGNOSIS — M84459A Pathological fracture, hip, unspecified, initial encounter for fracture: Secondary | ICD-10-CM

## 2019-03-20 DIAGNOSIS — M8580 Other specified disorders of bone density and structure, unspecified site: Secondary | ICD-10-CM | POA: Insufficient documentation

## 2019-03-20 DIAGNOSIS — M899 Disorder of bone, unspecified: Secondary | ICD-10-CM

## 2019-03-20 DIAGNOSIS — Z79899 Other long term (current) drug therapy: Secondary | ICD-10-CM | POA: Diagnosis not present

## 2019-03-20 DIAGNOSIS — I10 Essential (primary) hypertension: Secondary | ICD-10-CM | POA: Insufficient documentation

## 2019-03-20 DIAGNOSIS — G893 Neoplasm related pain (acute) (chronic): Secondary | ICD-10-CM

## 2019-03-20 DIAGNOSIS — E039 Hypothyroidism, unspecified: Secondary | ICD-10-CM | POA: Diagnosis not present

## 2019-03-20 DIAGNOSIS — Z86718 Personal history of other venous thrombosis and embolism: Secondary | ICD-10-CM | POA: Insufficient documentation

## 2019-03-20 DIAGNOSIS — I82401 Acute embolism and thrombosis of unspecified deep veins of right lower extremity: Secondary | ICD-10-CM

## 2019-03-20 DIAGNOSIS — I700 Atherosclerosis of aorta: Secondary | ICD-10-CM

## 2019-03-20 DIAGNOSIS — M84659P Pathological fracture in other disease, hip, unspecified, subsequent encounter for fracture with malunion: Secondary | ICD-10-CM

## 2019-03-20 DIAGNOSIS — D6859 Other primary thrombophilia: Secondary | ICD-10-CM

## 2019-03-20 LAB — CBC WITH DIFFERENTIAL/PLATELET
Abs Immature Granulocytes: 0.03 10*3/uL (ref 0.00–0.07)
Basophils Absolute: 0.1 10*3/uL (ref 0.0–0.1)
Basophils Relative: 1 %
Eosinophils Absolute: 0.2 10*3/uL (ref 0.0–0.5)
Eosinophils Relative: 3 %
HCT: 38.3 % (ref 36.0–46.0)
Hemoglobin: 12.3 g/dL (ref 12.0–15.0)
Immature Granulocytes: 0 %
Lymphocytes Relative: 16 %
Lymphs Abs: 1.2 10*3/uL (ref 0.7–4.0)
MCH: 28.5 pg (ref 26.0–34.0)
MCHC: 32.1 g/dL (ref 30.0–36.0)
MCV: 88.7 fL (ref 80.0–100.0)
Monocytes Absolute: 0.9 10*3/uL (ref 0.1–1.0)
Monocytes Relative: 12 %
Neutro Abs: 5 10*3/uL (ref 1.7–7.7)
Neutrophils Relative %: 68 %
Platelets: 238 10*3/uL (ref 150–400)
RBC: 4.32 MIL/uL (ref 3.87–5.11)
RDW: 14.8 % (ref 11.5–15.5)
WBC: 7.4 10*3/uL (ref 4.0–10.5)
nRBC: 0 % (ref 0.0–0.2)

## 2019-03-20 LAB — COMPREHENSIVE METABOLIC PANEL
ALT: 15 U/L (ref 0–44)
AST: 12 U/L — ABNORMAL LOW (ref 15–41)
Albumin: 3.6 g/dL (ref 3.5–5.0)
Alkaline Phosphatase: 112 U/L (ref 38–126)
Anion gap: 13 (ref 5–15)
BUN: 16 mg/dL (ref 8–23)
CO2: 25 mmol/L (ref 22–32)
Calcium: 10.8 mg/dL — ABNORMAL HIGH (ref 8.9–10.3)
Chloride: 106 mmol/L (ref 98–111)
Creatinine, Ser: 0.82 mg/dL (ref 0.44–1.00)
GFR calc Af Amer: >60 mL/min (ref >60)
GFR calc non Af Amer: >60 mL/min (ref >60)
Glucose, Bld: 121 mg/dL — ABNORMAL HIGH (ref 70–99)
Potassium: 4 mmol/L (ref 3.5–5.1)
Sodium: 144 mmol/L (ref 135–145)
Total Bilirubin: 0.2 mg/dL — ABNORMAL LOW (ref 0.3–1.2)
Total Protein: 6.6 g/dL (ref 6.5–8.1)

## 2019-03-20 LAB — PROTIME-INR
INR: 2.2 — ABNORMAL HIGH (ref 0.8–1.2)
Prothrombin Time: 23.8 seconds — ABNORMAL HIGH (ref 11.4–15.2)

## 2019-03-20 MED ORDER — FULVESTRANT 250 MG/5ML IM SOLN
500.0000 mg | Freq: Once | INTRAMUSCULAR | Status: AC
  Administered 2019-03-20: 500 mg via INTRAMUSCULAR
  Filled 2019-03-20: qty 10

## 2019-03-20 NOTE — Patient Instructions (Signed)
Fulvestrant injection What is this medicine? FULVESTRANT (ful VES trant) blocks the effects of estrogen. It is used to treat breast cancer. This medicine may be used for other purposes; ask your health care provider or pharmacist if you have questions. COMMON BRAND NAME(S): FASLODEX What should I tell my health care provider before I take this medicine? They need to know if you have any of these conditions:  bleeding disorders  liver disease  low blood counts, like low white cell, platelet, or red cell counts  an unusual or allergic reaction to fulvestrant, other medicines, foods, dyes, or preservatives  pregnant or trying to get pregnant  breast-feeding How should I use this medicine? This medicine is for injection into a muscle. It is usually given by a health care professional in a hospital or clinic setting. Talk to your pediatrician regarding the use of this medicine in children. Special care may be needed. Overdosage: If you think you have taken too much of this medicine contact a poison control center or emergency room at once. NOTE: This medicine is only for you. Do not share this medicine with others. What if I miss a dose? It is important not to miss your dose. Call your doctor or health care professional if you are unable to keep an appointment. What may interact with this medicine?  medicines that treat or prevent blood clots like warfarin, enoxaparin, dalteparin, apixaban, dabigatran, and rivaroxaban This list may not describe all possible interactions. Give your health care provider a list of all the medicines, herbs, non-prescription drugs, or dietary supplements you use. Also tell them if you smoke, drink alcohol, or use illegal drugs. Some items may interact with your medicine. What should I watch for while using this medicine? Your condition will be monitored carefully while you are receiving this medicine. You will need important blood work done while you are taking  this medicine. Do not become pregnant while taking this medicine or for at least 1 year after stopping it. Women of child-bearing potential will need to have a negative pregnancy test before starting this medicine. Women should inform their doctor if they wish to become pregnant or think they might be pregnant. There is a potential for serious side effects to an unborn child. Men should inform their doctors if they wish to father a child. This medicine may lower sperm counts. Talk to your health care professional or pharmacist for more information. Do not breast-feed an infant while taking this medicine or for 1 year after the last dose. What side effects may I notice from receiving this medicine? Side effects that you should report to your doctor or health care professional as soon as possible:  allergic reactions like skin rash, itching or hives, swelling of the face, lips, or tongue  feeling faint or lightheaded, falls  pain, tingling, numbness, or weakness in the legs  signs and symptoms of infection like fever or chills; cough; flu-like symptoms; sore throat  vaginal bleeding Side effects that usually do not require medical attention (report to your doctor or health care professional if they continue or are bothersome):  aches, pains  constipation  diarrhea  headache  hot flashes  nausea, vomiting  pain at site where injected  stomach pain This list may not describe all possible side effects. Call your doctor for medical advice about side effects. You may report side effects to FDA at 1-800-FDA-1088. Where should I keep my medicine? This drug is given in a hospital or clinic and will   not be stored at home. NOTE: This sheet is a summary. It may not cover all possible information. If you have questions about this medicine, talk to your doctor, pharmacist, or health care provider.  2020 Elsevier/Gold Standard (2017-08-24 11:34:41)  

## 2019-03-20 NOTE — Progress Notes (Signed)
[facility]  [phone] [phone]     ID: Alicia Vasquez OB: 06/23/1940  MR#: [id number]  QBH#:[id number]  Patient Care Team: Briscoe Deutscher, DO as PCP - General (Family Medicine) Philemon Kingdom, MD as Consulting Physician (Internal Medicine) Caprice Renshaw, MD as Referring Physician (Internal Medicine) Magrinat, [name], MD as Consulting Physician (Oncology) [name], DDS as Consulting Physician (Dentistry) [name], [place] as Consulting Physician (Physician Assistant) [name] Tamala Fothergill, DPM as Consulting Physician (Podiatry) OTHER MD:   CHIEF COMPLAINT: Estrogen receptor positive breast cancer  CURRENT TREATMENT:  Fulvestrant; warfarin   INTERVAL HISTORY: Alicia Vasquez returns today for follow-up and treatment of her metastatic estrogen receptor positive breast cancer. She was last seen here on 02/20/2019.   She continues on fulvestrant.  Aside from the discomfort of the administration of this drug she sometimes feels like she has to go to the bathroom more to urinate for 2 or 3 days after a dose.  She has no other side effects from it.  Since her last visit here, she underwent a chest CT with contrast on 03/18/2019 showing: No signs of disease recurrence in the chest. Chronic changes as above.  She underwent a whole body bone scan on 03/18/2019 showing: No evidence of bony metastatic disease. Multi joint arthritic changes. Increased tracer activity surrounding the majority of the left hip prosthesis, including the femoral and acetabular components. This can be seen with prosthetic loosening.    REVIEW OF SYSTEMS: Alicia Vasquez mostly stays in a wheelchair at home but even in a wheelchair she is able to do a little bit of sweeping and a little bit of vacuuming.  She does not do any cooking because the 79 year old friend in whose house she lives is a Engineer, maintenance (IT) and likes to do her own food.  Alicia Vasquez has gained a little weight as a result.  Her  son that is Alicia Vasquez's son is doing some of the shopping.  He is living in [name] own house.  He used to live in [place].  She has had no bleeding or bruising.  Aside from these issues a detailed review of systems today was stable   BREAST CANCER HISTORY: From doctor [name]'s intake node 07/24/2013:  "78 y.o. female. Who presented with SOB and had a CT chest performed that revealed a right breast mass. Mammogram/ultrsound on 2/13 showed a mass in the 11 o'clock position in the right breast measuring 1.5 cm. Also noted was a right axillary LN measuring 1.6 cm. MRI not performed. Biopsy of mass and lymph done. 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 Lymph node + for metastatic carcinoma."  [On 09/09/2013 the patient underwent right lumpectomy and sentinel lymph node sampling. This showed (SZA [id number]) multifocal invasive ductal carcinoma, grade 1, the largest lesion measuring 1.8 cm, the second lesion 1.2 cm. One of 4 sentinel lymph nodes was positive, with extracapsular extension. Margins were positive. HER-2 was repeated and was again negative. Further surgery 09/16/2013 obtained clear margins.  Her subsequent history is as detailed below   PAST MEDICAL HISTORY: Past Medical History:  Diagnosis Date   Allergy    Anxiety    Arthritis    Blood transfusion without reported diagnosis    Breast cancer ([place]) 07/12/2013   Invasive Mammary Carcinoma   DVT (deep vein thrombosis) in pregnancy    Hypertension    Hypothyroid  Metastatic cancer to bone ([place]) dx'd 12/2016   hip   Personal history of radiation therapy    Pneumonia    Radiation 11/21/13-01/07/14   Right Breast/Supraclavicular    PAST SURGICAL HISTORY: Past Surgical History:  Procedure Laterality Date   BREAST LUMPECTOMY Left 2015   BREAST LUMPECTOMY WITH RADIOACTIVE SEED LOCALIZATION Right 09/09/2013     Procedure: BREAST LUMPECTOMY WITH RADIOACTIVE SEED LOCALIZATION WITH AXILLARY NODE EXCISION;  Surgeon: [name], MD;  Location: [facility];  Service: General;  Laterality: Right;   DENTAL SURGERY  04/19/2012   13 TEETH REMOVED   DILATION AND CURETTAGE OF UTERUS     ORIF PERIPROSTHETIC FRACTURE Left 01/31/2017   Procedure: REVISION and OPEN REDUCTION INTERNAL FIXATION (ORIF) PERIPROSTHETIC FRACTURE LEFT HIP;  Surgeon: Paralee Cancel, MD;  Location: [facility];  Service: Orthopedics;  Laterality: Left;  120 mins   RE-EXCISION OF BREAST LUMPECTOMY Right 09/24/2013   Procedure: RE-EXCISION OF RIGHT BREAST LUMPECTOMY;  Surgeon: [name], MD;  Location: [facility];  Service: General;  Laterality: Right;   TOTAL HIP ARTHROPLASTY Left 01/16/2017   Procedure: TOTAL HIP ARTHROPLASTY POSTERIOR;  Surgeon: Paralee Cancel, MD;  Location: [facility];  Service: Orthopedics;  Laterality: Left;    FAMILY HISTORY Family History  Problem Relation Age of Onset   Heart disease Brother    Colon cancer Brother    Prostate cancer Brother    the patient's father died at the age of 64 after an automobile accident. The patient's mother died at the age of 37. She was a Marine scientist here in [name] in the old [facility]. She was infected with polio and was confined to a wheelchair for a good part of her life. She eventually died of pneumonia. The patient had one brother, who died with prostate cancer. She had no sisters. There is no history of breast or ovarian cancer in the family.   GYNECOLOGIC HISTORY:  Menarche age 78, first live birth age 78, the patient is GX P1. the patient is GX P1. She went through the change of life at age 78. 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 She is widowed. Currently she is staying with her friend [name], 65 years old, who is a retired Radio producer. The patient's son  Alicia Vasquez lived in [place] but more recently he moved into [place] prior home. He works in Engineer, technical sales. The patient has no grandchildren. She is a Tourist information centre manager but currently attends a [employer] with her friend [name]   ADVANCED DIRECTIVES: Not in place   HEALTH MAINTENANCE: Social History   Tobacco Use   Smoking status: Never Smoker   Smokeless tobacco: Never Used  Substance Use Topics   Alcohol use: No    Alcohol/week: 0.0 standard drinks   Drug use: No     Colonoscopy: Never  PAP:  Bone density: 09/20/2016 showed a T score of -2.2  Lipid panel:  Allergies  Allergen Reactions   Anesthetics, Amide Hypertension   Benadryl [Diphenhydramine Hcl] Other (See Comments)    Dizziness   Carbocaine [Mepivacaine Hcl] Hypertension   Codeine Other (See Comments)    Dizziness   Epinephrine Hypertension   Sulfa Antibiotics Other (See Comments)    dizziness   Latex Other (See Comments)    Blisters in mouth   Tramadol     Sedation.    Penicillins Rash    Has patient had a PCN reaction causing immediate  rash, facial/tongue/throat swelling, SOB or lightheadedness with hypotension: Unknown Has patient had a PCN reaction causing severe rash involving mucus membranes or skin necrosis: Unknown Has patient had a PCN reaction that required hospitalization: Unknown Has patient had a PCN reaction occurring within the last 10 years: Unknown If all of the above answers are "NO", then may proceed with Cephalosporin use.     Current Outpatient Medications  Medication Sig Dispense Refill   Cholecalciferol (VITAMIN D3) 5000 UNITS TABS Take 5,000 Units by mouth daily.      HYDROcodone-acetaminophen (NORCO/VICODIN) 5-325 MG tablet Take 1 tablet by mouth every 6 (six) hours as needed for moderate pain. 120 tablet 0   ketoconazole (NIZORAL) 2 % cream Apply 1 application topically daily. 15 g 0   SYNTHROID 125 MCG tablet TAKE 1 TABLET BY MOUTH EVERY DAY BEFORE BREAKFAST 30 tablet 2    vitamin C (ASCORBIC ACID) 250 MG tablet Take 250 mg by mouth 3 (three) times daily.     warfarin (COUMADIN) 5 MG tablet TAKE 1.5-2 TABLETS ONE TIME ONLY AT 6 PM FOR 1 DOSE. TAKE 7.5 MG ALTERNATING WITH 10 MG DAILY 270 tablet 1   No current facility-administered medications for this visit.     OBJECTIVE: Older white woman examined in a wheelchair  Vitals:   03/20/19 1344  BP: (!) 169/71  Pulse: 76  Resp: 18  Temp: 98.7 F (37.1 C)  SpO2: 98%   Wt Readings from Last 3 Encounters:  03/20/19 234 lb 4.8 oz (106.3 kg)  02/20/19 236 lb 3.2 oz (107.1 kg)  01/24/19 233 lb 4.8 oz (105.8 kg)   Body mass index is 40.22 kg/m.    ECOG FS:2 - Symptomatic, <50% confined to bed  Ocular: Sclerae unicteric, pupils round and equal Ear-nose-throat: Wearing a mask Lymphatic: No cervical or supraclavicular adenopathy Lungs no rales or rhonchi Heart regular rate and rhythm Abd soft, nontender, positive bowel sounds MSK no focal spinal tenderness, no joint edema Neuro: non-focal, well-oriented, appropriate affect Breasts: Deferred   LAB RESULTS:  CMP     Component Value Date/Time   NA 143 02/20/2019 1042   NA 141 05/16/2017 1416   K 4.0 02/20/2019 1042   K 3.8 05/16/2017 1416   CL 106 02/20/2019 1042   CO2 29 02/20/2019 1042   CO2 27 05/16/2017 1416   GLUCOSE 110 (H) 02/20/2019 1042   GLUCOSE 140 05/16/2017 1416   BUN 15 02/20/2019 1042   BUN 13.2 05/16/2017 1416   CREATININE 0.83 02/20/2019 1042   CREATININE 0.77 03/21/2018 1417   CREATININE 0.80 09/22/2017 1555   CREATININE 0.8 05/16/2017 1416   CALCIUM 10.9 (H) 02/20/2019 1042   CALCIUM 11.0 (H) 05/16/2017 1416   PROT 6.9 02/20/2019 1042   PROT 6.9 05/16/2017 1416   ALBUMIN 3.9 02/20/2019 1042   ALBUMIN 3.6 05/16/2017 1416   AST 13 (L) 02/20/2019 1042   AST 14 (L) 03/21/2018 1417   AST 13 05/16/2017 1416   ALT 13 02/20/2019 1042   ALT 14 03/21/2018 1417   ALT <6 05/16/2017 1416   ALKPHOS 113 02/20/2019 1042   ALKPHOS  130 05/16/2017 1416   BILITOT 0.2 (L) 02/20/2019 1042   BILITOT 0.3 03/21/2018 1417   BILITOT 0.31 05/16/2017 1416   GFRNONAA >60 02/20/2019 1042   GFRNONAA >60 03/21/2018 1417   GFRNONAA 75 08/19/2015 1602   GFRAA >60 02/20/2019 1042   GFRAA >60 03/21/2018 1417   GFRAA 87 08/19/2015 1602    I No results found  for: SPEP  Lab Results  Component Value Date   WBC 7.4 03/20/2019   NEUTROABS 5.0 03/20/2019   HGB 12.3 03/20/2019   HCT 38.3 03/20/2019   MCV 88.7 03/20/2019   PLT 238 03/20/2019      Chemistry      Component Value Date/Time   NA 143 02/20/2019 1042   NA 141 05/16/2017 1416   K 4.0 02/20/2019 1042   K 3.8 05/16/2017 1416   CL 106 02/20/2019 1042   CO2 29 02/20/2019 1042   CO2 27 05/16/2017 1416   BUN 15 02/20/2019 1042   BUN 13.2 05/16/2017 1416   CREATININE 0.83 02/20/2019 1042   CREATININE 0.77 03/21/2018 1417   CREATININE 0.80 09/22/2017 1555   CREATININE 0.8 05/16/2017 1416      Component Value Date/Time   CALCIUM 10.9 (H) 02/20/2019 1042   CALCIUM 11.0 (H) 05/16/2017 1416   ALKPHOS 113 02/20/2019 1042   ALKPHOS 130 05/16/2017 1416   AST 13 (L) 02/20/2019 1042   AST 14 (L) 03/21/2018 1417   AST 13 05/16/2017 1416   ALT 13 02/20/2019 1042   ALT 14 03/21/2018 1417   ALT <6 05/16/2017 1416   BILITOT 0.2 (L) 02/20/2019 1042   BILITOT 0.3 03/21/2018 1417   BILITOT 0.31 05/16/2017 1416       No results found for: LABCA2  No components found for: LABCA125  Recent Labs  Lab 03/20/19 1324  INR 2.2*    Urinalysis    Component Value Date/Time   COLORURINE STRAW (A) 02/20/2018 [place] 02/20/2018 1606   LABSPEC 1.009 02/20/2018 1606   LABSPEC 1.010 09/22/2016 1553   PHURINE 6.0 02/20/2018 1606   GLUCOSEU NEGATIVE 02/20/2018 1606   GLUCOSEU Negative 09/22/2016 1553   HGBUR NEGATIVE 02/20/2018 1606   BILIRUBINUR Negative 03/16/2018 1433   BILIRUBINUR Negative 09/22/2016 1553   KETONESUR NEGATIVE 02/20/2018 1606    PROTEINUR Negative 03/16/2018 1433   PROTEINUR NEGATIVE 02/20/2018 1606   UROBILINOGEN 0.2 03/16/2018 1433   UROBILINOGEN 0.2 11/15/2016 1358   UROBILINOGEN 0.2 09/22/2016 1553   NITRITE Negative 03/16/2018 1433   NITRITE NEGATIVE 02/20/2018 1606   LEUKOCYTESUR Negative 03/16/2018 1433   LEUKOCYTESUR Negative 09/22/2016 1553    STUDIES: Ct Chest W Contrast  Result Date: 03/18/2019 CLINICAL DATA:  Invasive breast cancer, stage IV with recurrence. EXAM: CT CHEST WITH CONTRAST TECHNIQUE: Multidetector CT imaging of the chest was performed during intravenous contrast administration. CONTRAST:  17m OMNIPAQUE IOHEXOL 300 MG/ML  SOLN COMPARISON:  05/29/2018 FINDINGS: Cardiovascular: Scattered atherosclerotic changes in the thoracic aorta with minimal calcification aortic valvular calcifications. No signs of pericardial effusion. Heart size is stable. Central pulmonary vasculature is of normal caliber. Mediastinum/Nodes: Scattered mediastinal lymph nodes, none with pathologic enlargement. No internal mammary nodal enlargement. Small air collection at the thoracic inlet showing no definite tracheal or esophageal communication remains unchanged. Lungs/Pleura: No sign of consolidation or evidence of pleural effusion. Calcified granulomatous disease in the chest. Subpleural reticulation in the right lung apex and along the anterior right upper lobe from prior radiation unchanged. Tiny subpleural nodule in the anterior right chest is unchanged. Upper Abdomen: Normal appearance of upper abdominal structures aside from the small hiatal hernia. Musculoskeletal: Spinal degenerative changes without acute or destructive bone finding. IMPRESSION: 1. No signs of disease recurrence in the chest. 2. Chronic changes as above. Aortic Atherosclerosis (ICD10-I70.0). Electronically Signed   By: GZetta BillsM.D.   On: 03/18/2019 11:05   Nm Bone Scan Whole  Body  Result Date: 03/18/2019 CLINICAL DATA:  Stage IV invasive  breast cancer, recurrent. Right knee, left hip and back pain. Status post left total hip arthroplasty on 01/16/2017 with a periprosthetic fracture on 01/31/2017. Status post right lumpectomy and re-excision in 2015. EXAM: NUCLEAR MEDICINE WHOLE BODY BONE SCAN TECHNIQUE: Whole body anterior and posterior images were obtained approximately 3 hours after intravenous injection of radiopharmaceutical. RADIOPHARMACEUTICALS:  21.1 mCi Technetium-20mMDP IV COMPARISON:  PET-CT dated 05/29/2018. Chest CT dated 03/18/2019. Left hip radiographs dated 04/18/2018. FINDINGS: Increased tracer activity surrounding the majority of the right hip prosthesis, including the femoral and acetabular components. Focally increased tracer activity in both shoulders, both knees, both sternoclavicular joints, both wrists and mid left neck. Normal renal and bladder activity. IMPRESSION: 1. No evidence of bony metastatic disease. 2. Multi joint arthritic changes. 3. Increased tracer activity surrounding the majority of the left hip prosthesis, including the femoral and acetabular components. This can be seen with prosthetic loosening. Electronically Signed   By: SClaudie ReveringM.D.   On: 03/18/2019 13:48    ASSESSMENT: 78y.o. Alicia Vasquez woman status post right breast upper outer quadrant lumpectomy and sentinel lymph node sampling 09/09/2013 for an mpT1c pN1a, stage IIA invasive ductal carcinoma, estrogen and progesterone receptor both 100% positive with strong staining intensity, MIB-1 of 17% and no HER-2 amplification  (1) additional surgery for margin clearance 09/16/2013 obtained negative margins  (2) Oncotype DX recurrence score of 4 predicts a risk of outside the breast recurrence within 10 years of 7% if the patient's only systemic therapy is tamoxifen for 5 years. It also predicts no benefit from chemotherapy  (3) adjuvant radiation completed 01/07/2014  (4) anastrozole started 02/27/2014 stopped within 2 weeks because of arm  swelling.   (a) bone density April 2016 showed osteopenia, with a t-score of -1.6  (b) anastrozole resumed 12/17/2015  (c) Bone density 09/20/2016 showed a T score of -2.2  (5) history of left lower extremity DVT 11/23/2012, initially on rivaroxaban, which caused chest pain, switch to Coumadin July 2014  (b) coumadin dose increased to 7.5/10 mg alternating days as of 06/13/2017   METASTATIC DISEASE: August 2018 (6) status post left total hip replacement 01/16/2017 for estrogen receptor positive adenocarcinoma.  (a) CA 27-29 was 46.4 as of 04/18/2017  (b) chest CT scan 05/02/2017 shows no lung or liver lesion concern; it does show aortic atherosclerosis  (c) baseline bone scan 05/02/2017 was negative  (d) PET scan 09/12/2017 shows no active disease, including bone  (e) PET scan and CT chest on 05/29/2018 show no active disease  (f) chest CT and bone scan 03/18/2019 showed no evidence of active disease  (7) fulvestrant started 04/18/2017  (8) denosumab/Xgeva started 05/16/2017  (a) changed to every 12-weeks after 10/03/2017 dose   (b) held starting with 06/13/2018 dose due to dental concerns  PLAN:  MAnarelyis now just over 2 years out from definitive diagnosis of metastatic breast cancer with very well-controlled disease.  She is tolerating her treatment well.  The plan is to continue fulvestrant indefinitely until there is evidence of disease progression.  She does have chronic pain.  She has been on a stable dose of narcotics for a long time.  She tolerates them well.  There is no evidence of abuse.  Since she gets labs here every 4 weeks we are also monitoring her INR, which is 2.2 today.  At this point I am comfortable seeing her every 6 months.  She will see [place]  again in May of next year.  She will of course continue the every 28-day fulvestrant.  She knows to call for any other problems that may develop before her next visit.  Magrinat, [name], MD  03/20/19 2:04 PM Medical  Oncology and Hematology [facility] [place], [place] [address] Tel. [phone]    Fax. [phone]  I, Jacqualyn Posey am acting as a Education administrator for Chauncey Cruel, MD.   I, Lurline Del MD, have reviewed the above documentation for accuracy and completeness, and I agree with the above.

## 2019-03-21 DIAGNOSIS — H2513 Age-related nuclear cataract, bilateral: Secondary | ICD-10-CM | POA: Diagnosis not present

## 2019-03-21 DIAGNOSIS — H524 Presbyopia: Secondary | ICD-10-CM | POA: Diagnosis not present

## 2019-03-21 LAB — CANCER ANTIGEN 27.29: CA 27.29: 43.2 U/mL — ABNORMAL HIGH (ref 0.0–38.6)

## 2019-03-22 ENCOUNTER — Telehealth: Payer: Self-pay | Admitting: Oncology

## 2019-03-22 NOTE — Telephone Encounter (Signed)
I talk with patient regarding schedule  

## 2019-04-04 ENCOUNTER — Other Ambulatory Visit: Payer: Self-pay | Admitting: Oncology

## 2019-04-04 DIAGNOSIS — E039 Hypothyroidism, unspecified: Secondary | ICD-10-CM

## 2019-04-17 ENCOUNTER — Other Ambulatory Visit: Payer: Self-pay | Admitting: Oncology

## 2019-04-17 ENCOUNTER — Inpatient Hospital Stay: Payer: Medicare Other | Attending: Oncology

## 2019-04-17 ENCOUNTER — Other Ambulatory Visit: Payer: Self-pay

## 2019-04-17 ENCOUNTER — Inpatient Hospital Stay: Payer: Medicare Other

## 2019-04-17 VITALS — BP 152/72 | HR 72 | Temp 98.2°F | Resp 18

## 2019-04-17 DIAGNOSIS — Z170 Estrogen receptor positive status [ER+]: Secondary | ICD-10-CM | POA: Diagnosis not present

## 2019-04-17 DIAGNOSIS — M8580 Other specified disorders of bone density and structure, unspecified site: Secondary | ICD-10-CM

## 2019-04-17 DIAGNOSIS — D6859 Other primary thrombophilia: Secondary | ICD-10-CM

## 2019-04-17 DIAGNOSIS — M84459A Pathological fracture, hip, unspecified, initial encounter for fracture: Secondary | ICD-10-CM

## 2019-04-17 DIAGNOSIS — C50411 Malignant neoplasm of upper-outer quadrant of right female breast: Secondary | ICD-10-CM

## 2019-04-17 DIAGNOSIS — M899 Disorder of bone, unspecified: Secondary | ICD-10-CM

## 2019-04-17 DIAGNOSIS — C7951 Secondary malignant neoplasm of bone: Secondary | ICD-10-CM | POA: Diagnosis not present

## 2019-04-17 DIAGNOSIS — I82401 Acute embolism and thrombosis of unspecified deep veins of right lower extremity: Secondary | ICD-10-CM

## 2019-04-17 DIAGNOSIS — M84659P Pathological fracture in other disease, hip, unspecified, subsequent encounter for fracture with malunion: Secondary | ICD-10-CM

## 2019-04-17 DIAGNOSIS — I700 Atherosclerosis of aorta: Secondary | ICD-10-CM

## 2019-04-17 DIAGNOSIS — Z86718 Personal history of other venous thrombosis and embolism: Secondary | ICD-10-CM | POA: Diagnosis not present

## 2019-04-17 DIAGNOSIS — M84559P Pathological fracture in neoplastic disease, hip, unspecified, subsequent encounter for fracture with malunion: Secondary | ICD-10-CM

## 2019-04-17 DIAGNOSIS — Z7901 Long term (current) use of anticoagulants: Secondary | ICD-10-CM | POA: Insufficient documentation

## 2019-04-17 LAB — PROTIME-INR
INR: 2.1 — ABNORMAL HIGH (ref 0.8–1.2)
Prothrombin Time: 23.7 seconds — ABNORMAL HIGH (ref 11.4–15.2)

## 2019-04-17 LAB — COMPREHENSIVE METABOLIC PANEL
ALT: 16 U/L (ref 0–44)
AST: 13 U/L — ABNORMAL LOW (ref 15–41)
Albumin: 3.9 g/dL (ref 3.5–5.0)
Alkaline Phosphatase: 120 U/L (ref 38–126)
Anion gap: 10 (ref 5–15)
BUN: 15 mg/dL (ref 8–23)
CO2: 27 mmol/L (ref 22–32)
Calcium: 10.6 mg/dL — ABNORMAL HIGH (ref 8.9–10.3)
Chloride: 105 mmol/L (ref 98–111)
Creatinine, Ser: 0.81 mg/dL (ref 0.44–1.00)
GFR calc Af Amer: >60 mL/min (ref >60)
GFR calc non Af Amer: >60 mL/min (ref >60)
Glucose, Bld: 118 mg/dL — ABNORMAL HIGH (ref 70–99)
Potassium: 4.1 mmol/L (ref 3.5–5.1)
Sodium: 142 mmol/L (ref 135–145)
Total Bilirubin: 0.3 mg/dL (ref 0.3–1.2)
Total Protein: 6.8 g/dL (ref 6.5–8.1)

## 2019-04-17 LAB — CBC WITH DIFFERENTIAL/PLATELET
Abs Immature Granulocytes: 0.01 10*3/uL (ref 0.00–0.07)
Basophils Absolute: 0.1 10*3/uL (ref 0.0–0.1)
Basophils Relative: 1 %
Eosinophils Absolute: 0.2 10*3/uL (ref 0.0–0.5)
Eosinophils Relative: 3 %
HCT: 40.7 % (ref 36.0–46.0)
Hemoglobin: 12.8 g/dL (ref 12.0–15.0)
Immature Granulocytes: 0 %
Lymphocytes Relative: 15 %
Lymphs Abs: 1 10*3/uL (ref 0.7–4.0)
MCH: 28.4 pg (ref 26.0–34.0)
MCHC: 31.4 g/dL (ref 30.0–36.0)
MCV: 90.2 fL (ref 80.0–100.0)
Monocytes Absolute: 0.7 10*3/uL (ref 0.1–1.0)
Monocytes Relative: 10 %
Neutro Abs: 4.7 10*3/uL (ref 1.7–7.7)
Neutrophils Relative %: 71 %
Platelets: 237 10*3/uL (ref 150–400)
RBC: 4.51 MIL/uL (ref 3.87–5.11)
RDW: 14.7 % (ref 11.5–15.5)
WBC: 6.7 10*3/uL (ref 4.0–10.5)
nRBC: 0 % (ref 0.0–0.2)

## 2019-04-17 MED ORDER — FULVESTRANT 250 MG/5ML IM SOLN
INTRAMUSCULAR | Status: AC
  Filled 2019-04-17: qty 5

## 2019-04-17 MED ORDER — HYDROCODONE-ACETAMINOPHEN 5-325 MG PO TABS: 1.0000 | ORAL_TABLET | Freq: Four times a day (QID) | ORAL | 0 refills | Status: DC | PRN

## 2019-04-17 MED ORDER — FULVESTRANT 250 MG/5ML IM SOLN
500.0000 mg | Freq: Once | INTRAMUSCULAR | Status: AC
  Administered 2019-04-17: 500 mg via INTRAMUSCULAR

## 2019-04-17 NOTE — Patient Instructions (Signed)
Fulvestrant injection What is this medicine? FULVESTRANT (ful VES trant) blocks the effects of estrogen. It is used to treat breast cancer. This medicine may be used for other purposes; ask your health care provider or pharmacist if you have questions. COMMON BRAND NAME(S): FASLODEX What should I tell my health care provider before I take this medicine? They need to know if you have any of these conditions:  bleeding disorders  liver disease  low blood counts, like low white cell, platelet, or red cell counts  an unusual or allergic reaction to fulvestrant, other medicines, foods, dyes, or preservatives  pregnant or trying to get pregnant  breast-feeding How should I use this medicine? This medicine is for injection into a muscle. It is usually given by a health care professional in a hospital or clinic setting. Talk to your pediatrician regarding the use of this medicine in children. Special care may be needed. Overdosage: If you think you have taken too much of this medicine contact a poison control center or emergency room at once. NOTE: This medicine is only for you. Do not share this medicine with others. What if I miss a dose? It is important not to miss your dose. Call your doctor or health care professional if you are unable to keep an appointment. What may interact with this medicine?  medicines that treat or prevent blood clots like warfarin, enoxaparin, dalteparin, apixaban, dabigatran, and rivaroxaban This list may not describe all possible interactions. Give your health care provider a list of all the medicines, herbs, non-prescription drugs, or dietary supplements you use. Also tell them if you smoke, drink alcohol, or use illegal drugs. Some items may interact with your medicine. What should I watch for while using this medicine? Your condition will be monitored carefully while you are receiving this medicine. You will need important blood work done while you are taking  this medicine. Do not become pregnant while taking this medicine or for at least 1 year after stopping it. Women of child-bearing potential will need to have a negative pregnancy test before starting this medicine. Women should inform their doctor if they wish to become pregnant or think they might be pregnant. There is a potential for serious side effects to an unborn child. Men should inform their doctors if they wish to father a child. This medicine may lower sperm counts. Talk to your health care professional or pharmacist for more information. Do not breast-feed an infant while taking this medicine or for 1 year after the last dose. What side effects may I notice from receiving this medicine? Side effects that you should report to your doctor or health care professional as soon as possible:  allergic reactions like skin rash, itching or hives, swelling of the face, lips, or tongue  feeling faint or lightheaded, falls  pain, tingling, numbness, or weakness in the legs  signs and symptoms of infection like fever or chills; cough; flu-like symptoms; sore throat  vaginal bleeding Side effects that usually do not require medical attention (report to your doctor or health care professional if they continue or are bothersome):  aches, pains  constipation  diarrhea  headache  hot flashes  nausea, vomiting  pain at site where injected  stomach pain This list may not describe all possible side effects. Call your doctor for medical advice about side effects. You may report side effects to FDA at 1-800-FDA-1088. Where should I keep my medicine? This drug is given in a hospital or clinic and will   not be stored at home. NOTE: This sheet is a summary. It may not cover all possible information. If you have questions about this medicine, talk to your doctor, pharmacist, or health care provider.  2020 Elsevier/Gold Standard (2017-08-24 11:34:41)  

## 2019-04-18 LAB — CANCER ANTIGEN 27.29: CA 27.29: 35.4 U/mL (ref 0.0–38.6)

## 2019-04-29 ENCOUNTER — Telehealth: Payer: Self-pay

## 2019-04-29 ENCOUNTER — Ambulatory Visit: Payer: Self-pay

## 2019-04-29 NOTE — Telephone Encounter (Signed)
Patient called stating that last evening she had severe chest pain that started in the middle of her chest and traveled under her arms.  She states she felt nauseated. The pain lasted 5-10 minutes and she states she was unable to move. She has had no other episodes of this pain.  Patient has had htn in the past but does not need treatment today.  She is a Financial controller. Per protocol patient will go to ER for evaluation of her symptoms.Care advice read to patient.  She verbalized understanding. Note will be routed to office.  Reason for Disposition . [1] Chest pain lasts > 5 minutes AND [2] occurred in past 3 days (72 hours)  Answer Assessment - Initial Assessment Questions 1. LOCATION: "Where does it hurt?"       Center rt and left 2. RADIATION: "Does the pain go anywhere else?" (e.g., into neck, jaw, arms, back)    Under arms both sides 3. ONSET: "When did the chest pain begin?" (Minutes, hours or days)     Last night 4. PATTERN "Does the pain come and go, or has it been constant since it started?"  "Does it get worse with exertion?"      Didn't move 5. DURATION: "How long does it last" (e.g., seconds, minutes, hours)     5-10 minutes 6. SEVERITY: "How bad is the pain?"  (e.g., Scale 1-10; mild, moderate, or severe)    - MILD (1-3): doesn't interfere with normal activities     - MODERATE (4-7): interferes with normal activities or awakens from sleep    - SEVERE (8-10): excruciating pain, unable to do any normal activities       8-10 7. CARDIAC RISK FACTORS: "Do you have any history of heart problems or risk factors for heart disease?" (e.g., angina, prior heart attack; diabetes, high blood pressure, high cholesterol, smoker, or strong family history of heart disease)    htn  No heart Dx Cancer 8. PULMONARY RISK FACTORS: "Do you have any history of lung disease?"  (e.g., blood clots in lung, asthma, emphysema, birth control pills)     No Blood clot leg taking warfarin 9. CAUSE: "What do  you think is causing the chest pain?"     unsure 10. OTHER SYMPTOMS: "Do you have any other symptoms?" (e.g., dizziness, nausea, vomiting, sweating, fever, difficulty breathing, cough)      nauea 11. PREGNANCY: "Is there any chance you are pregnant?" "When was your last menstrual period?"      N/A  Protocols used: CHEST PAIN-A-AH

## 2019-04-29 NOTE — Telephone Encounter (Signed)
Pt reports chest pain and shortness of breath that start last night around 8pm.  Pt stated, "it happened last night and the pain across my chest was so bad I felt like I couldn't breathe."    Pt states she feels weak today, chest pain and shortness of breath have decreased.  RN encouraged patient to be evaluated by ED.  Pt agreed to have son drive her to a local ED or urgent care.

## 2019-04-30 NOTE — Telephone Encounter (Signed)
See note

## 2019-04-30 NOTE — Telephone Encounter (Signed)
FYI patient sent message to oncology as well.

## 2019-04-30 NOTE — Telephone Encounter (Signed)
Appropriate disposition-thank you °

## 2019-05-15 ENCOUNTER — Inpatient Hospital Stay: Payer: Medicare Other | Attending: Oncology

## 2019-05-15 ENCOUNTER — Other Ambulatory Visit: Payer: Self-pay

## 2019-05-15 ENCOUNTER — Inpatient Hospital Stay: Payer: Medicare Other

## 2019-05-15 VITALS — BP 143/78 | HR 72 | Temp 98.2°F | Resp 18

## 2019-05-15 DIAGNOSIS — Z5111 Encounter for antineoplastic chemotherapy: Secondary | ICD-10-CM | POA: Insufficient documentation

## 2019-05-15 DIAGNOSIS — I82401 Acute embolism and thrombosis of unspecified deep veins of right lower extremity: Secondary | ICD-10-CM

## 2019-05-15 DIAGNOSIS — C50411 Malignant neoplasm of upper-outer quadrant of right female breast: Secondary | ICD-10-CM | POA: Insufficient documentation

## 2019-05-15 DIAGNOSIS — C7951 Secondary malignant neoplasm of bone: Secondary | ICD-10-CM

## 2019-05-15 DIAGNOSIS — I700 Atherosclerosis of aorta: Secondary | ICD-10-CM

## 2019-05-15 DIAGNOSIS — Z7901 Long term (current) use of anticoagulants: Secondary | ICD-10-CM | POA: Diagnosis not present

## 2019-05-15 DIAGNOSIS — M84459A Pathological fracture, hip, unspecified, initial encounter for fracture: Secondary | ICD-10-CM

## 2019-05-15 DIAGNOSIS — Z170 Estrogen receptor positive status [ER+]: Secondary | ICD-10-CM | POA: Insufficient documentation

## 2019-05-15 DIAGNOSIS — D6859 Other primary thrombophilia: Secondary | ICD-10-CM

## 2019-05-15 DIAGNOSIS — M8580 Other specified disorders of bone density and structure, unspecified site: Secondary | ICD-10-CM

## 2019-05-15 DIAGNOSIS — Z86718 Personal history of other venous thrombosis and embolism: Secondary | ICD-10-CM | POA: Diagnosis not present

## 2019-05-15 DIAGNOSIS — M84659P Pathological fracture in other disease, hip, unspecified, subsequent encounter for fracture with malunion: Secondary | ICD-10-CM

## 2019-05-15 DIAGNOSIS — M899 Disorder of bone, unspecified: Secondary | ICD-10-CM

## 2019-05-15 LAB — COMPREHENSIVE METABOLIC PANEL
ALT: 16 U/L (ref 0–44)
AST: 17 U/L (ref 15–41)
Albumin: 4.1 g/dL (ref 3.5–5.0)
Alkaline Phosphatase: 144 U/L — ABNORMAL HIGH (ref 38–126)
Anion gap: 11 (ref 5–15)
BUN: 15 mg/dL (ref 8–23)
CO2: 27 mmol/L (ref 22–32)
Calcium: 11.1 mg/dL — ABNORMAL HIGH (ref 8.9–10.3)
Chloride: 104 mmol/L (ref 98–111)
Creatinine, Ser: 0.87 mg/dL (ref 0.44–1.00)
GFR calc Af Amer: >60 mL/min (ref >60)
GFR calc non Af Amer: >60 mL/min (ref >60)
Glucose, Bld: 120 mg/dL — ABNORMAL HIGH (ref 70–99)
Potassium: 4.2 mmol/L (ref 3.5–5.1)
Sodium: 142 mmol/L (ref 135–145)
Total Bilirubin: 0.4 mg/dL (ref 0.3–1.2)
Total Protein: 7.3 g/dL (ref 6.5–8.1)

## 2019-05-15 LAB — CBC WITH DIFFERENTIAL/PLATELET
Abs Immature Granulocytes: 0.04 10*3/uL (ref 0.00–0.07)
Basophils Absolute: 0.1 10*3/uL (ref 0.0–0.1)
Basophils Relative: 1 %
Eosinophils Absolute: 0.2 10*3/uL (ref 0.0–0.5)
Eosinophils Relative: 3 %
HCT: 43.1 % (ref 36.0–46.0)
Hemoglobin: 13.7 g/dL (ref 12.0–15.0)
Immature Granulocytes: 1 %
Lymphocytes Relative: 12 %
Lymphs Abs: 1 10*3/uL (ref 0.7–4.0)
MCH: 28.8 pg (ref 26.0–34.0)
MCHC: 31.8 g/dL (ref 30.0–36.0)
MCV: 90.7 fL (ref 80.0–100.0)
Monocytes Absolute: 0.7 10*3/uL (ref 0.1–1.0)
Monocytes Relative: 9 %
Neutro Abs: 6.4 10*3/uL (ref 1.7–7.7)
Neutrophils Relative %: 74 %
Platelets: 254 10*3/uL (ref 150–400)
RBC: 4.75 MIL/uL (ref 3.87–5.11)
RDW: 15 % (ref 11.5–15.5)
WBC: 8.5 10*3/uL (ref 4.0–10.5)
nRBC: 0 % (ref 0.0–0.2)

## 2019-05-15 LAB — PROTIME-INR
INR: 2.8 — ABNORMAL HIGH (ref 0.8–1.2)
Prothrombin Time: 29.1 seconds — ABNORMAL HIGH (ref 11.4–15.2)

## 2019-05-15 MED ORDER — FULVESTRANT 250 MG/5ML IM SOLN
500.0000 mg | Freq: Once | INTRAMUSCULAR | Status: AC
  Administered 2019-05-15: 500 mg via INTRAMUSCULAR

## 2019-05-15 NOTE — Patient Instructions (Signed)
Fulvestrant injection What is this medicine? FULVESTRANT (ful VES trant) blocks the effects of estrogen. It is used to treat breast cancer. This medicine may be used for other purposes; ask your health care provider or pharmacist if you have questions. COMMON BRAND NAME(S): FASLODEX What should I tell my health care provider before I take this medicine? They need to know if you have any of these conditions:  bleeding disorders  liver disease  low blood counts, like low white cell, platelet, or red cell counts  an unusual or allergic reaction to fulvestrant, other medicines, foods, dyes, or preservatives  pregnant or trying to get pregnant  breast-feeding How should I use this medicine? This medicine is for injection into a muscle. It is usually given by a health care professional in a hospital or clinic setting. Talk to your pediatrician regarding the use of this medicine in children. Special care may be needed. Overdosage: If you think you have taken too much of this medicine contact a poison control center or emergency room at once. NOTE: This medicine is only for you. Do not share this medicine with others. What if I miss a dose? It is important not to miss your dose. Call your doctor or health care professional if you are unable to keep an appointment. What may interact with this medicine?  medicines that treat or prevent blood clots like warfarin, enoxaparin, dalteparin, apixaban, dabigatran, and rivaroxaban This list may not describe all possible interactions. Give your health care provider a list of all the medicines, herbs, non-prescription drugs, or dietary supplements you use. Also tell them if you smoke, drink alcohol, or use illegal drugs. Some items may interact with your medicine. What should I watch for while using this medicine? Your condition will be monitored carefully while you are receiving this medicine. You will need important blood work done while you are taking  this medicine. Do not become pregnant while taking this medicine or for at least 1 year after stopping it. Women of child-bearing potential will need to have a negative pregnancy test before starting this medicine. Women should inform their doctor if they wish to become pregnant or think they might be pregnant. There is a potential for serious side effects to an unborn child. Men should inform their doctors if they wish to father a child. This medicine may lower sperm counts. Talk to your health care professional or pharmacist for more information. Do not breast-feed an infant while taking this medicine or for 1 year after the last dose. What side effects may I notice from receiving this medicine? Side effects that you should report to your doctor or health care professional as soon as possible:  allergic reactions like skin rash, itching or hives, swelling of the face, lips, or tongue  feeling faint or lightheaded, falls  pain, tingling, numbness, or weakness in the legs  signs and symptoms of infection like fever or chills; cough; flu-like symptoms; sore throat  vaginal bleeding Side effects that usually do not require medical attention (report to your doctor or health care professional if they continue or are bothersome):  aches, pains  constipation  diarrhea  headache  hot flashes  nausea, vomiting  pain at site where injected  stomach pain This list may not describe all possible side effects. Call your doctor for medical advice about side effects. You may report side effects to FDA at 1-800-FDA-1088. Where should I keep my medicine? This drug is given in a hospital or clinic and will   not be stored at home. NOTE: This sheet is a summary. It may not cover all possible information. If you have questions about this medicine, talk to your doctor, pharmacist, or health care provider.  2020 Elsevier/Gold Standard (2017-08-24 11:34:41)  

## 2019-05-16 LAB — CANCER ANTIGEN 27.29: CA 27.29: 41.9 U/mL — ABNORMAL HIGH (ref 0.0–38.6)

## 2019-05-18 ENCOUNTER — Other Ambulatory Visit: Payer: Self-pay | Admitting: Oncology

## 2019-05-18 DIAGNOSIS — M84559P Pathological fracture in neoplastic disease, hip, unspecified, subsequent encounter for fracture with malunion: Secondary | ICD-10-CM

## 2019-05-18 MED ORDER — HYDROCODONE-ACETAMINOPHEN 5-325 MG PO TABS: 1.0000 | ORAL_TABLET | Freq: Four times a day (QID) | ORAL | 0 refills | Status: DC | PRN

## 2019-05-31 DIAGNOSIS — F329 Major depressive disorder, single episode, unspecified: Secondary | ICD-10-CM | POA: Insufficient documentation

## 2019-05-31 DIAGNOSIS — G893 Neoplasm related pain (acute) (chronic): Secondary | ICD-10-CM | POA: Insufficient documentation

## 2019-05-31 DIAGNOSIS — Z170 Estrogen receptor positive status [ER+]: Secondary | ICD-10-CM | POA: Insufficient documentation

## 2019-06-03 ENCOUNTER — Other Ambulatory Visit: Payer: Self-pay

## 2019-06-03 ENCOUNTER — Ambulatory Visit (INDEPENDENT_AMBULATORY_CARE_PROVIDER_SITE_OTHER): Payer: Medicare Other | Admitting: Podiatry

## 2019-06-03 ENCOUNTER — Encounter: Payer: Self-pay | Admitting: Podiatry

## 2019-06-03 DIAGNOSIS — B351 Tinea unguium: Secondary | ICD-10-CM

## 2019-06-03 DIAGNOSIS — M79674 Pain in right toe(s): Secondary | ICD-10-CM

## 2019-06-03 DIAGNOSIS — M79675 Pain in left toe(s): Secondary | ICD-10-CM | POA: Diagnosis not present

## 2019-06-03 DIAGNOSIS — L600 Ingrowing nail: Secondary | ICD-10-CM | POA: Diagnosis not present

## 2019-06-03 MED ORDER — NEOMYCIN-POLYMYXIN-HC 3.5-10000-1 OT SOLN: OTIC | 1 refills | Status: DC

## 2019-06-03 NOTE — Patient Instructions (Signed)

## 2019-06-03 NOTE — Progress Notes (Signed)
Subjective:   Patient ID: Alicia Vasquez, female   DOB: 79 y.o.   MRN: [id number]   HPI Patient presents with chronic ingrown toenail deformity right big toe medial border that is been painful and thick yellow brittle nailbeds 1-5 both feet that she cannot take care of.  Presents with caregiver   ROS      Objective:  Physical Exam  Neurovascular status intact with incurvated medial border right hallux that is painful when pressed with yellow thick brittle nailbeds 1-5 both feet     Assessment:  Ingrown toenail deformity with pain right hallux medial border with mycotic nail infection 1-5 both feet with symptoms     Plan:  H&P all conditions reviewed and recommended correction of the ingrown toenail which she wants done.  Explained procedure risk and she wants surgery and today I infiltrated the right hallux 60 mg like Marcaine mixture sterile prep applied to the toe and using sterile instrumentation remove the medial border exposed matrix applied phenol 3 applications 30 seconds followed by alcohol lavage and sterile dressing.  Gave instructions for soaks drop usage and instructed to call with any questions concerns which may arise.  Debrided nailbeds 1-5 both feet with no iatrogenic bleeding noted

## 2019-06-04 ENCOUNTER — Encounter (HOSPITAL_COMMUNITY): Payer: Self-pay | Admitting: Family Medicine

## 2019-06-04 ENCOUNTER — Telehealth: Payer: Self-pay | Admitting: *Deleted

## 2019-06-04 DIAGNOSIS — M25552 Pain in left hip: Secondary | ICD-10-CM | POA: Diagnosis not present

## 2019-06-04 DIAGNOSIS — Z853 Personal history of malignant neoplasm of breast: Secondary | ICD-10-CM | POA: Diagnosis not present

## 2019-06-04 DIAGNOSIS — M545 Low back pain: Secondary | ICD-10-CM | POA: Diagnosis not present

## 2019-06-04 DIAGNOSIS — Z79899 Other long term (current) drug therapy: Secondary | ICD-10-CM | POA: Insufficient documentation

## 2019-06-04 DIAGNOSIS — E039 Hypothyroidism, unspecified: Secondary | ICD-10-CM | POA: Diagnosis not present

## 2019-06-04 DIAGNOSIS — M549 Dorsalgia, unspecified: Secondary | ICD-10-CM | POA: Diagnosis present

## 2019-06-04 DIAGNOSIS — Z86718 Personal history of other venous thrombosis and embolism: Secondary | ICD-10-CM | POA: Insufficient documentation

## 2019-06-04 DIAGNOSIS — Z7901 Long term (current) use of anticoagulants: Secondary | ICD-10-CM | POA: Diagnosis not present

## 2019-06-04 DIAGNOSIS — I10 Essential (primary) hypertension: Secondary | ICD-10-CM | POA: Diagnosis not present

## 2019-06-04 DIAGNOSIS — R102 Pelvic and perineal pain: Secondary | ICD-10-CM | POA: Diagnosis not present

## 2019-06-04 LAB — URINALYSIS, ROUTINE W REFLEX MICROSCOPIC
Bilirubin Urine: NEGATIVE
Glucose, UA: NEGATIVE mg/dL
Hgb urine dipstick: NEGATIVE
Ketones, ur: NEGATIVE mg/dL
Leukocytes,Ua: NEGATIVE
Nitrite: NEGATIVE
Protein, ur: NEGATIVE mg/dL
Specific Gravity, Urine: 1.011 (ref 1.005–1.030)
pH: 7 (ref 5.0–8.0)

## 2019-06-04 NOTE — Telephone Encounter (Signed)
VM returned and obtained pt's identified VM.  VM from pt stating she was having symptoms of a UTI and new back pain.  She wanted to see about getting something for the UTI and inquired if she needed to have a scan for new back pain ( lower).  This RN left message stating back pain may be related to the UTI issues- and she should contact her primary MD for best follow up.  This RN's name and office number given if pt needed to call this office again.

## 2019-06-04 NOTE — ED Triage Notes (Signed)
Patient is from home and transported via [facility]. Patient is complaining of bilateral hip pain, L>R, and lower back pain. Three days ago, she was standing up and heard a "pop". Denies any falls. Patient already has a history of chronic hip pain and has took Oxycodone.

## 2019-06-04 NOTE — ED Notes (Signed)
Sent urine culture to main lab with urine.

## 2019-06-04 NOTE — ED Triage Notes (Signed)
While triaging further, she stated she had some urinary frequency and urgency.

## 2019-06-05 ENCOUNTER — Emergency Department (HOSPITAL_COMMUNITY): Payer: Medicare Other

## 2019-06-05 ENCOUNTER — Emergency Department (HOSPITAL_COMMUNITY)
Admission: EM | Admit: 2019-06-05 | Discharge: 2019-06-05 | Disposition: A | Discharge status: Home / Self Care | Payer: Medicare Other | Attending: Emergency Medicine | Admitting: Emergency Medicine

## 2019-06-05 DIAGNOSIS — M545 Low back pain, unspecified: Secondary | ICD-10-CM

## 2019-06-05 DIAGNOSIS — M25552 Pain in left hip: Secondary | ICD-10-CM

## 2019-06-05 DIAGNOSIS — R102 Pelvic and perineal pain: Secondary | ICD-10-CM | POA: Diagnosis not present

## 2019-06-05 MED ORDER — HYDROCODONE-ACETAMINOPHEN 5-325 MG PO TABS
2.0000 | ORAL_TABLET | Freq: Once | ORAL | Status: AC
  Administered 2019-06-05: 2 via ORAL
  Filled 2019-06-05: qty 2

## 2019-06-05 NOTE — ED Notes (Signed)
Pt ambulated in room with walker and 2 person assist.

## 2019-06-05 NOTE — ED Provider Notes (Signed)
[facility] DEPT Provider Note   CSN: [id number] Arrival date & time: 06/04/19  1302     History Chief Complaint  Patient presents with  . Hip Pain  . Back Pain    Alicia Vasquez is a 79 y.o. female.  The history is provided by the patient.  Hip Pain This is a recurrent problem. The problem occurs constantly. The problem has been gradually worsening. Pertinent negatives include no chest pain and no abdominal pain. The symptoms are aggravated by walking. The symptoms are relieved by rest.  Back Pain Associated symptoms: no abdominal pain, no chest pain and no fever    Patient with history of metastatic breast cancer, hypertension presents with back and left hip pain. She reports while standing and moving recently she had sudden onset of pain in her back and hip and heard a pop.  No fall.  She has had pain previously in her left hip as she had mets to the same hip previously and required surgery    Past Medical History:  Diagnosis Date  . Allergy   . Anxiety   . Arthritis   . Blood transfusion without reported diagnosis   . Breast cancer ([place]) 07/12/2013   Invasive Mammary Carcinoma  . DVT (deep vein thrombosis) in pregnancy   . Hypertension   . Hypothyroid   . Metastatic cancer to bone ([place]) dx'd 12/2016   hip  . Personal history of radiation therapy   . Pneumonia   . Radiation 11/21/13-01/07/14   Right Breast/Supraclavicular    Patient Active Problem List   Diagnosis Date Noted  . Morbid obesity with BMI of 40.0-44.9, adult ([place]) 10/31/2018  . Aortic atherosclerosis ([place]) 06/13/2017  . Peri-prosthetic fracture of shaft of femur 01/26/2017  . Pain from bone metastases ([place]) 01/25/2017  . Bone metastases ([place]) 01/25/2017  . Endometrial hyperplasia 01/16/2017  . Lytic bone lesion of left femur 01/15/2017  . Hip fracture, pathological ([place]) 01/15/2017  . Lymphedema 09/17/2015  . Long term current use of anticoagulant therapy  08/23/2015  . DVT, lower extremity ([place]) 06/18/2015  . Bilateral knee pain 04/03/2015  . Arm edema 08/28/2014  . Vitamin D deficiency 04/23/2014  . Hyperparathyroidism ([place]) 04/23/2014  . Depression 07/18/2013  . Malignant neoplasm of upper-outer quadrant of right breast in female, estrogen receptor positive ([place]) 07/15/2013  . Overactive bladder 01/30/2013  . Primary hypercoagulable state ([place]) 12/19/2012  . HTN (hypertension) 09/27/2011  . Hearing loss 09/27/2011  . Seasonal allergies 09/27/2011  . Hypothyroid 08/26/2011    Past Surgical History:  Procedure Laterality Date  . BREAST LUMPECTOMY Left 2015  . BREAST LUMPECTOMY WITH RADIOACTIVE SEED LOCALIZATION Right 09/09/2013   Procedure: BREAST LUMPECTOMY WITH RADIOACTIVE SEED LOCALIZATION WITH AXILLARY NODE EXCISION;  Surgeon: [name], MD;  Location: [facility];  Service: General;  Laterality: Right;  . DENTAL SURGERY  04/19/2012   13 TEETH REMOVED  . DILATION AND CURETTAGE OF UTERUS    . ORIF PERIPROSTHETIC FRACTURE Left 01/31/2017   Procedure: REVISION and OPEN REDUCTION INTERNAL FIXATION (ORIF) PERIPROSTHETIC FRACTURE LEFT HIP;  Surgeon: Paralee Cancel, MD;  Location: [facility];  Service: Orthopedics;  Laterality: Left;  120 mins  . RE-EXCISION OF BREAST LUMPECTOMY Right 09/24/2013   Procedure: RE-EXCISION OF RIGHT BREAST LUMPECTOMY;  Surgeon: [name], MD;  Location: [facility];  Service: General;  Laterality: Right;  . TOTAL HIP ARTHROPLASTY Left 01/16/2017   Procedure: TOTAL HIP ARTHROPLASTY POSTERIOR;  Surgeon: Paralee Cancel, MD;  Location[facility];  Service: Orthopedics;  Laterality: Left;     OB History   No obstetric history on file.     Family History  Problem Relation Age of Onset  . Heart disease Brother   . Colon cancer Brother   . Prostate cancer Brother     Social History   Tobacco Use  . Smoking status: Never Smoker  . Smokeless tobacco: Never Used  Substance  Use Topics  . Alcohol use: No    Alcohol/week: 0.0 standard drinks  . Drug use: No    Home Medications Prior to Admission medications   Medication Sig Start Date End Date Taking? Authorizing Provider  Cholecalciferol (VITAMIN D3) 5000 UNITS TABS Take 5,000 Units by mouth daily.     [provider]  HYDROcodone-acetaminophen (NORCO/VICODIN) 5-325 MG tablet Take 1 tablet by mouth every 6 (six) hours as needed for moderate pain. 05/18/19   Magrinat, [name], MD  ketoconazole (NIZORAL) 2 % cream Apply 1 application topically daily. 12/27/18   Gardenia Phlegm, NP  neomycin-polymyxin-hydrocortisone (CORTISPORIN) OTIC solution Apply 1-2 drops to toe after soaking BID 06/03/19   Wallene Huh, DPM  SYNTHROID 125 MCG tablet TAKE 1 TABLET BY MOUTH EVERY DAY BEFORE BREAKFAST 04/04/19   Magrinat, [name], MD  vitamin C (ASCORBIC ACID) 250 MG tablet Take 250 mg by mouth 3 (three) times daily.    [provider]  warfarin (COUMADIN) 5 MG tablet TAKE 1.5-2 TABLETS ONE TIME ONLY AT 6 PM FOR 1 DOSE. TAKE 7.5 MG ALTERNATING WITH 10 MG DAILY 09/10/18   Magrinat, [name], MD    Allergies    Anesthetics, amide; Benadryl [diphenhydramine hcl]; Carbocaine [mepivacaine hcl]; Codeine; Epinephrine; Sulfa antibiotics; Latex; Tramadol; and Penicillins  Review of Systems   Review of Systems  Constitutional: Negative for fever.  Cardiovascular: Negative for chest pain.  Gastrointestinal: Negative for abdominal pain.  Musculoskeletal: Positive for arthralgias and back pain.  All other systems reviewed and are negative.   Physical Exam Updated Vital Signs BP (!) 167/128 (BP Location: Left Arm)   Pulse 96   Temp 98.3 F (36.8 C) (Oral)   Resp 16   Ht 1.626 m (5\' 4" )   Wt 103.4 kg   SpO2 99%   BMI 39.14 kg/m   Physical Exam CONSTITUTIONAL: elderly, no distress HEAD: Normocephalic/atraumatic EYES: EOMI/PERRL ENMT: Mucous membranes moist, hirsutism NECK: supple no meningeal  signs SPINE/BACK:entire spine nontender, no obvious bruising to spine CV: S1/S2 noted, no murmurs/rubs/gallops noted LUNGS: Lungs are clear to auscultation bilaterally, no apparent distress ABDOMEN: soft, nontender, obese GU:no cva tenderness NEURO: Pt is awake/alert/appropriate, moves all extremitiesx4.  No facial droop.   equal distal motor 5/5 strength noted with the following: hip flexion/knee flexion/extension, ankle dorsi/plantar flexion, great toe extension intact bilaterally,  no sensory deficit in any dermatome.   EXTREMITIES: pulses normal/equal, full ROM, well-healed scar noted to the left hip.  There is no bruising.  Pelvis stable.  Full range of motion of left hip with mild tenderness. SKIN: warm, color normal PSYCH: no abnormalities of mood noted, alert and oriented to situation  ED Results / Procedures / Treatments   Labs (all labs ordered are listed, but only abnormal results are displayed) Labs Reviewed  URINALYSIS, ROUTINE W REFLEX MICROSCOPIC - Abnormal; Notable for the following components:      Result Value   APPearance CLOUDY (*)    All other components within normal limits    EKG None  Radiology DG  Lumbar Spine Complete  Result Date: 06/05/2019 CLINICAL DATA:  79 year old female with increasing low back pain. EXAM: LUMBAR SPINE - COMPLETE 4+ VIEW COMPARISON:  Lumbar spine radiograph dated 12/19/2016. FINDINGS: There is no acute fracture or subluxation of the lumbar spine. Old compression fracture of the superior endplate of 624THL similar to prior radiograph. Multilevel facet arthropathy. The visualized posterior elements are otherwise intact. The bones are osteopenic. Partially visualized left hip arthroplasty. The soft tissues are unremarkable. Atherosclerotic calcification of the aorta. IMPRESSION: 1. No acute fracture or subluxation of the lumbar spine. 2. Old compression fracture of the superior endplate of 624THL. Electronically Signed   By: Anner Crete M.D.    On: 06/05/2019 02:13   DG Pelvis 1-2 Views  Result Date: 06/05/2019 CLINICAL DATA:  [age]-year-old female with low back and pelvic pain. EXAM: PELVIS - 1-2 VIEW COMPARISON:  Pelvic radiograph dated 02/01/2017. FINDINGS: There is no acute fracture or dislocation. The bones are osteopenic. Total left hip arthroplasty noted which appears intact and in anatomic alignment. Interval healing of the previously seen left femoral intertrochanteric fracture with callus formation. The soft tissues are unremarkable. IMPRESSION: No acute fracture or dislocation. Electronically Signed   By: Anner Crete M.D.   On: 06/05/2019 02:15    Procedures Procedures   Medications Ordered in ED Medications  HYDROcodone-acetaminophen (NORCO/VICODIN) 5-325 MG per tablet 2 tablet (2 tablets Oral Given 06/05/19 0201)    ED Course  I have reviewed the triage vital signs and the nursing notes.  Pertinent  imaging results that were available during my care of the patient were reviewed by me and considered in my medical decision making (see chart for details).    MDM Rules/Calculators/A&P                      1:27 AM Patient with history of metastatic breast cancer to her left hip which required arthroplasty presents with left hip and back pain.  No obvious deformities noted, she denies any falls.  Will obtain imaging of lumbar spine and pelvis. 3:36 AM Patient is improved.  She is sitting up and appears more comfortable.  Patient was able to walk with a walker and she feels near baseline.  She already has pain medicines at home.  No acute fracture on x-rays.  She will be discharged home.  She has no neuro deficits to suggest any acute spinal injury/myelopathy Final Clinical Impression(s) / ED Diagnoses Final diagnoses:  Acute midline low back pain without sciatica  Left hip pain    Rx / DC Orders ED Discharge Orders    None       Ripley Fraise, MD 06/05/19 [phone]

## 2019-06-05 NOTE — ED Notes (Signed)
Assisted pt to wheelchair then to restroom and back to room.

## 2019-06-06 ENCOUNTER — Telehealth: Payer: Self-pay | Admitting: *Deleted

## 2019-06-06 MED ORDER — CELECOXIB 200 MG PO CAPS: 200.0000 mg | ORAL_CAPSULE | Freq: Two times a day (BID) | ORAL | 0 refills | Status: DC

## 2019-06-06 NOTE — Telephone Encounter (Addendum)
This RN spoke with pt per her onset of pain in lower back this week .  She states Tuesday she stood up and " heard a pop " in her lower back with onset of increased pain especially with certain movements. She was unable to get out of bed and went to the ER by ambulance.  She was discharged post plain films and given 2 hydrocodone.  Primary issue besides pain is inability to " get out of bed " and has to get assistance.  She denies any current issues of urinary or bowel changes ( she had frequency earlier this week that has now resolved with negative U/A0  She has used heat on her back without benefit.  Pt is asking about additional medications or scans.  Above reviewed with MD with recommendation for pt to continue hydrocodone as prescribed. Prescription for Celebrex sent to pharmacy.  Pt is to use ice on lower back - not heat.  This RN called and discussed the above with pt.  Of note she asked about " can you get me one of those bars that go above the bed to help me get up?'  This RN informed pt " trapeze " bars are no longer advised nor available per MD.  This RN discussed need for her to arrange her bed,sofa or lazyboy style chair in a manner that she can maneuver to get up for personal care.

## 2019-06-08 ENCOUNTER — Other Ambulatory Visit: Payer: Self-pay | Admitting: Oncology

## 2019-06-08 DIAGNOSIS — E039 Hypothyroidism, unspecified: Secondary | ICD-10-CM

## 2019-06-12 ENCOUNTER — Inpatient Hospital Stay: Payer: Medicare Other | Attending: Oncology

## 2019-06-12 ENCOUNTER — Inpatient Hospital Stay: Payer: Medicare Other

## 2019-06-12 ENCOUNTER — Other Ambulatory Visit: Payer: Self-pay

## 2019-06-12 VITALS — BP 145/68 | HR 84 | Temp 98.2°F | Resp 16

## 2019-06-12 DIAGNOSIS — C50411 Malignant neoplasm of upper-outer quadrant of right female breast: Secondary | ICD-10-CM

## 2019-06-12 DIAGNOSIS — C7951 Secondary malignant neoplasm of bone: Secondary | ICD-10-CM | POA: Diagnosis not present

## 2019-06-12 DIAGNOSIS — Z170 Estrogen receptor positive status [ER+]: Secondary | ICD-10-CM | POA: Insufficient documentation

## 2019-06-12 DIAGNOSIS — Z79818 Long term (current) use of other agents affecting estrogen receptors and estrogen levels: Secondary | ICD-10-CM | POA: Insufficient documentation

## 2019-06-12 DIAGNOSIS — M8580 Other specified disorders of bone density and structure, unspecified site: Secondary | ICD-10-CM

## 2019-06-12 DIAGNOSIS — M84459A Pathological fracture, hip, unspecified, initial encounter for fracture: Secondary | ICD-10-CM

## 2019-06-12 DIAGNOSIS — I82401 Acute embolism and thrombosis of unspecified deep veins of right lower extremity: Secondary | ICD-10-CM

## 2019-06-12 DIAGNOSIS — D6859 Other primary thrombophilia: Secondary | ICD-10-CM

## 2019-06-12 DIAGNOSIS — M899 Disorder of bone, unspecified: Secondary | ICD-10-CM

## 2019-06-12 DIAGNOSIS — M84659P Pathological fracture in other disease, hip, unspecified, subsequent encounter for fracture with malunion: Secondary | ICD-10-CM

## 2019-06-12 DIAGNOSIS — G893 Neoplasm related pain (acute) (chronic): Secondary | ICD-10-CM

## 2019-06-12 DIAGNOSIS — I700 Atherosclerosis of aorta: Secondary | ICD-10-CM

## 2019-06-12 LAB — CBC WITH DIFFERENTIAL/PLATELET
Abs Immature Granulocytes: 0.05 10*3/uL (ref 0.00–0.07)
Basophils Absolute: 0.1 10*3/uL (ref 0.0–0.1)
Basophils Relative: 1 %
Eosinophils Absolute: 0.4 10*3/uL (ref 0.0–0.5)
Eosinophils Relative: 5 %
HCT: 40.6 % (ref 36.0–46.0)
Hemoglobin: 12.8 g/dL (ref 12.0–15.0)
Immature Granulocytes: 1 %
Lymphocytes Relative: 14 %
Lymphs Abs: 1 10*3/uL (ref 0.7–4.0)
MCH: 29 pg (ref 26.0–34.0)
MCHC: 31.5 g/dL (ref 30.0–36.0)
MCV: 91.9 fL (ref 80.0–100.0)
Monocytes Absolute: 0.9 10*3/uL (ref 0.1–1.0)
Monocytes Relative: 12 %
Neutro Abs: 5 10*3/uL (ref 1.7–7.7)
Neutrophils Relative %: 67 %
Platelets: 259 10*3/uL (ref 150–400)
RBC: 4.42 MIL/uL (ref 3.87–5.11)
RDW: 15 % (ref 11.5–15.5)
WBC: 7.4 10*3/uL (ref 4.0–10.5)
nRBC: 0 % (ref 0.0–0.2)

## 2019-06-12 LAB — COMPREHENSIVE METABOLIC PANEL
ALT: 16 U/L (ref 0–44)
AST: 10 U/L — ABNORMAL LOW (ref 15–41)
Albumin: 3.6 g/dL (ref 3.5–5.0)
Alkaline Phosphatase: 135 U/L — ABNORMAL HIGH (ref 38–126)
Anion gap: 8 (ref 5–15)
BUN: 17 mg/dL (ref 8–23)
CO2: 28 mmol/L (ref 22–32)
Calcium: 10.3 mg/dL (ref 8.9–10.3)
Chloride: 107 mmol/L (ref 98–111)
Creatinine, Ser: 0.83 mg/dL (ref 0.44–1.00)
GFR calc Af Amer: >60 mL/min (ref >60)
GFR calc non Af Amer: >60 mL/min (ref >60)
Glucose, Bld: 134 mg/dL — ABNORMAL HIGH (ref 70–99)
Potassium: 4.1 mmol/L (ref 3.5–5.1)
Sodium: 143 mmol/L (ref 135–145)
Total Bilirubin: <0.2 mg/dL — ABNORMAL LOW (ref 0.3–1.2)
Total Protein: 6.6 g/dL (ref 6.5–8.1)

## 2019-06-12 LAB — PROTIME-INR
INR: 1.5 — ABNORMAL HIGH (ref 0.8–1.2)
Prothrombin Time: 18 seconds — ABNORMAL HIGH (ref 11.4–15.2)

## 2019-06-12 MED ORDER — FULVESTRANT 250 MG/5ML IM SOLN: 500.0000 mg | Freq: Once | INTRAMUSCULAR | Status: DC

## 2019-06-12 MED ORDER — FULVESTRANT 250 MG/5ML IM SOLN
INTRAMUSCULAR | Status: AC
  Filled 2019-06-12: qty 5

## 2019-06-12 NOTE — Patient Instructions (Signed)
Fulvestrant injection What is this medicine? FULVESTRANT (ful VES trant) blocks the effects of estrogen. It is used to treat breast cancer. This medicine may be used for other purposes; ask your health care provider or pharmacist if you have questions. COMMON BRAND NAME(S): FASLODEX What should I tell my health care provider before I take this medicine? They need to know if you have any of these conditions:  bleeding disorders  liver disease  low blood counts, like low white cell, platelet, or red cell counts  an unusual or allergic reaction to fulvestrant, other medicines, foods, dyes, or preservatives  pregnant or trying to get pregnant  breast-feeding How should I use this medicine? This medicine is for injection into a muscle. It is usually given by a health care professional in a hospital or clinic setting. Talk to your pediatrician regarding the use of this medicine in children. Special care may be needed. Overdosage: If you think you have taken too much of this medicine contact a poison control center or emergency room at once. NOTE: This medicine is only for you. Do not share this medicine with others. What if I miss a dose? It is important not to miss your dose. Call your doctor or health care professional if you are unable to keep an appointment. What may interact with this medicine?  medicines that treat or prevent blood clots like warfarin, enoxaparin, dalteparin, apixaban, dabigatran, and rivaroxaban This list may not describe all possible interactions. Give your health care provider a list of all the medicines, herbs, non-prescription drugs, or dietary supplements you use. Also tell them if you smoke, drink alcohol, or use illegal drugs. Some items may interact with your medicine. What should I watch for while using this medicine? Your condition will be monitored carefully while you are receiving this medicine. You will need important blood work done while you are taking  this medicine. Do not become pregnant while taking this medicine or for at least 1 year after stopping it. Women of child-bearing potential will need to have a negative pregnancy test before starting this medicine. Women should inform their doctor if they wish to become pregnant or think they might be pregnant. There is a potential for serious side effects to an unborn child. Men should inform their doctors if they wish to father a child. This medicine may lower sperm counts. Talk to your health care professional or pharmacist for more information. Do not breast-feed an infant while taking this medicine or for 1 year after the last dose. What side effects may I notice from receiving this medicine? Side effects that you should report to your doctor or health care professional as soon as possible:  allergic reactions like skin rash, itching or hives, swelling of the face, lips, or tongue  feeling faint or lightheaded, falls  pain, tingling, numbness, or weakness in the legs  signs and symptoms of infection like fever or chills; cough; flu-like symptoms; sore throat  vaginal bleeding Side effects that usually do not require medical attention (report to your doctor or health care professional if they continue or are bothersome):  aches, pains  constipation  diarrhea  headache  hot flashes  nausea, vomiting  pain at site where injected  stomach pain This list may not describe all possible side effects. Call your doctor for medical advice about side effects. You may report side effects to FDA at 1-800-FDA-1088. Where should I keep my medicine? This drug is given in a hospital or clinic and will   not be stored at home. NOTE: This sheet is a summary. It may not cover all possible information. If you have questions about this medicine, talk to your doctor, pharmacist, or health care provider.  2020 Elsevier/Gold Standard (2017-08-24 11:34:41)  

## 2019-06-13 LAB — CANCER ANTIGEN 27.29: CA 27.29: 33.7 U/mL (ref 0.0–38.6)

## 2019-06-14 ENCOUNTER — Other Ambulatory Visit: Payer: Self-pay | Admitting: *Deleted

## 2019-06-14 DIAGNOSIS — Z86711 Personal history of pulmonary embolism: Secondary | ICD-10-CM

## 2019-06-14 MED ORDER — WARFARIN SODIUM 5 MG PO TABS: ORAL_TABLET | ORAL | 1 refills | Status: DC

## 2019-06-20 ENCOUNTER — Encounter: Payer: Self-pay | Admitting: Internal Medicine

## 2019-06-20 ENCOUNTER — Other Ambulatory Visit: Payer: Self-pay

## 2019-06-20 ENCOUNTER — Ambulatory Visit (INDEPENDENT_AMBULATORY_CARE_PROVIDER_SITE_OTHER): Payer: Medicare Other | Admitting: Internal Medicine

## 2019-06-20 DIAGNOSIS — E039 Hypothyroidism, unspecified: Secondary | ICD-10-CM

## 2019-06-20 DIAGNOSIS — E559 Vitamin D deficiency, unspecified: Secondary | ICD-10-CM

## 2019-06-20 DIAGNOSIS — E213 Hyperparathyroidism, unspecified: Secondary | ICD-10-CM | POA: Diagnosis not present

## 2019-06-20 NOTE — Progress Notes (Addendum)
Patient ID: Alicia Vasquez, female   DOB: Jun 03, 1940, 79 y.o.   MRN: [id number]  Patient location: Home My location: Office Persons participating in the virtual visit: patient, provider  Referring Provider: Dr. Briscoe Deutscher  I connected with the patient on 06/20/19 at  2:07 PM EST by a video enabled telemedicine application and verified that I am speaking with the correct person.   I discussed the limitations of evaluation and management by telemedicine and the availability of in person appointments. The patient expressed understanding and agreed to proceed.   Details of the encounter are shown below.  HPI  Alicia Vasquez is a 79 y.o.-year-old female, returning for follow-up for hyperparathyroidism and severe vitamin D deficiency, hypothyroidism. Last visit 1 year ago.  She continues treatment for metastatic lung cancer.  She is on Xgeva.  Reviewed and addended history: Pt was dx with found to have an elevated parathyroid hormone in 02/2014.   She also had high calcium levels >> high at 11.5.  Per her preference, we are following her clinically and biochemically.  I reviewed pertinent labs: Lab Results  Component Value Date   PTH 46 05/05/2017   PTH 26 12/06/2016   PTH Comment 12/06/2016   PTH 31 12/07/2015   PTH Comment 12/07/2015   PTH 37 06/09/2015   PTH Comment 06/09/2015   PTH 37 12/19/2014   PTH Comment 12/19/2014   PTH 88 (H) 07/27/2014   CALCIUM 10.3 06/12/2019   CALCIUM 11.1 (H) 05/15/2019   CALCIUM 10.6 (H) 04/17/2019   CALCIUM 10.8 (H) 03/20/2019   CALCIUM 10.9 (H) 02/20/2019   CALCIUM 10.5 (H) 01/24/2019   CALCIUM 10.8 (H) 12/27/2018   CALCIUM 10.5 (H) 11/28/2018   CALCIUM 10.4 (H) 10/11/2018   CALCIUM 10.3 10/03/2018   Vitamin D deficiency-PTH level normalized after normalization of her vitamin D levels, however, calcium levels remain elevated.  Pt was taking 1000 IU vitamin D in the past - a level was very low then >> we increased her vitamin  D supplement to 5000 units daily >> vitamin D normalized.  She is taking this dose consistently now.  Latest vitamin D level was normal: Lab Results  Component Value Date   VD25OH 59.91 06/14/2018   VD25OH 63 05/05/2017   VD25OH 45.98 12/06/2016   VD25OH 47.68 12/07/2015   VD25OH 45.83 06/09/2015   VD25OH 30.58 12/19/2014   VD25OH 26.82 (L) 10/17/2014   VD25OH 7.19 (L) 04/18/2014   Magnesium, phosphorus, calcitriol levels were normal: Component     Latest Ref Rng 04/18/2014         3:04 PM  Vitamin D 1, 25 (OH) Total     18 - 72 pg/mL 55  Vitamin D3 1, 25 (OH)      55  Vitamin D2 1, 25 (OH)      <8  Magnesium     1.5 - 2.5 mg/dL 2.1  Phosphorus     2.3 - 4.6 mg/dL 2.8   She did not collect a 24h urine yet.  Reviewed previous DXA scan reports: - 09/03/2014: no osteoporosis/but osteopenia at LFN: -1.6 - 08/2016: Osteopenia RFN: -2.2, LFN: -2.1; R radius 33% distal: -0.2  She had the following fractures: - 1994: R foot  - 1990's: L hairline fracture  - 12/2016: Periprosthetic pathological hip fracture  She was on Xgeva >> stopped.   No history of kidney stones.  No CKD. Last BUN/Cr: Lab Results  Component Value Date   BUN 17 06/12/2019   CREATININE  0.83 06/12/2019   She also has a history of breast cancer and is on Arimidex. She had RxTx. No ChTx.  Hypothyroidism:  Pt is on levothyroxine 125 mcg daily, taken: - in am - fasting - at least 30 min from b'fast - no Ca, Fe, MVI, PPIs - not on Biotin  Last TSH normal: Lab Results  Component Value Date   TSH 1.33 06/14/2018   Pt denies: - feeling nodules in neck - dysphagia - choking - SOB with lying down But has longstanding hoarseness.  ROS: Constitutional: no weight gain/no weight loss, no fatigue, no subjective hyperthermia, no subjective hypothermia Eyes: no blurry vision, no xerophthalmia ENT: no sore throat, + see HPI Cardiovascular: no CP but chest discomfort - food upsetting the stomach/no  SOB/no palpitations/no leg swelling Respiratory: no cough/no SOB/no wheezing Gastrointestinal: no N/no V/no D/no C/no acid reflux Musculoskeletal: no muscle aches/+ joint aches (back pain) Skin: no rashes, no hair loss Neurological: no tremors/+ numbness in R fingers/no tingling/no dizziness  I reviewed pt's medications, allergies, PMH, social hx, family hx, and changes were documented in the history of present illness. Otherwise, unchanged from my initial visit note.   Past Medical History:  Diagnosis Date  . Allergy   . Anxiety   . Arthritis   . Blood transfusion without reported diagnosis   . Breast cancer ([place]) 07/12/2013   Invasive Mammary Carcinoma  . DVT (deep vein thrombosis) in pregnancy   . Hypertension   . Hypothyroid   . Metastatic cancer to bone ([place]) dx'd 12/2016   hip  . Personal history of radiation therapy   . Pneumonia   . Radiation 11/21/13-01/07/14   Right Breast/Supraclavicular   Past Surgical History:  Procedure Laterality Date  . BREAST LUMPECTOMY Left 2015  . BREAST LUMPECTOMY WITH RADIOACTIVE SEED LOCALIZATION Right 09/09/2013   Procedure: BREAST LUMPECTOMY WITH RADIOACTIVE SEED LOCALIZATION WITH AXILLARY NODE EXCISION;  Surgeon: [name], MD;  Location: [facility];  Service: General;  Laterality: Right;  . DENTAL SURGERY  04/19/2012   13 TEETH REMOVED  . DILATION AND CURETTAGE OF UTERUS    . ORIF PERIPROSTHETIC FRACTURE Left 01/31/2017   Procedure: REVISION and OPEN REDUCTION INTERNAL FIXATION (ORIF) PERIPROSTHETIC FRACTURE LEFT HIP;  Surgeon: Paralee Cancel, MD;  [facility];  Service: Orthopedics;  Laterality: Left;  120 mins  . RE-EXCISION OF BREAST LUMPECTOMY Right 09/24/2013   Procedure: RE-EXCISION OF RIGHT BREAST LUMPECTOMY;  Surgeon: [name], MD;  Location: [facility];  Service: General;  Laterality: Right;  . TOTAL HIP ARTHROPLASTY Left 01/16/2017   Procedure: TOTAL HIP ARTHROPLASTY POSTERIOR;   Surgeon: Paralee Cancel, MD;  [facility];  Service: Orthopedics;  Laterality: Left;   History   Social History  . Marital Status: Widowed    Spouse Name: N/A    Number of Children: 1   Occupational History  . Retired Radio broadcast assistant   Social History Main Topics  . Smoking status: Never Smoker   . Smokeless tobacco: Not on file  . Alcohol Use: No  . Drug Use: No   Social History Narrative   Exercise yard work   Current Outpatient Medications on File Prior to Visit  Medication Sig Dispense Refill  . celecoxib (CELEBREX) 200 MG capsule Take 1 capsule (200 mg total) by mouth 2 (two) times daily. 60 capsule 0  . Cholecalciferol (VITAMIN D3) 5000 UNITS TABS Take 5,000 Units by mouth daily.     Marland Kitchen HYDROcodone-acetaminophen (NORCO/VICODIN) 5-325 MG tablet Take  1 tablet by mouth every 6 (six) hours as needed for moderate pain. 120 tablet 0  . ketoconazole (NIZORAL) 2 % cream Apply 1 application topically daily. 15 g 0  . neomycin-polymyxin-hydrocortisone (CORTISPORIN) OTIC solution Apply 1-2 drops to toe after soaking BID 10 mL 1  . SYNTHROID 125 MCG tablet TAKE 1 TABLET BY MOUTH EVERY DAY BEFORE BREAKFAST 30 tablet 2  . vitamin C (ASCORBIC ACID) 250 MG tablet Take 250 mg by mouth 3 (three) times daily.    Marland Kitchen warfarin (COUMADIN) 5 MG tablet TAKE 1.5-2 TABLETS ONE TIME ONLY AT 6 PM FOR 1 DOSE. TAKE 7.5 MG ALTERNATING WITH 10 MG DAILY 270 tablet 1   No current facility-administered medications on file prior to visit.   Allergies  Allergen Reactions  . Anesthetics, Amide Hypertension  . Benadryl [Diphenhydramine Hcl] Other (See Comments)    Dizziness  . Carbocaine [Mepivacaine Hcl] Hypertension  . Codeine Other (See Comments)    Dizziness  . Epinephrine Hypertension  . Sulfa Antibiotics Other (See Comments)    dizziness  . Latex Other (See Comments)    Blisters in mouth  . Tramadol     Sedation.   Marland Kitchen Penicillins Rash    Has patient had a PCN reaction causing immediate rash,  facial/tongue/throat swelling, SOB or lightheadedness with hypotension: Unknown Has patient had a PCN reaction causing severe rash involving mucus membranes or skin necrosis: Unknown Has patient had a PCN reaction that required hospitalization: Unknown Has patient had a PCN reaction occurring within the last 10 years: Unknown If all of the above answers are "NO", then may proceed with Cephalosporin use.    Family History  Problem Relation Age of Onset  . Heart disease Brother   . Colon cancer Brother   . Prostate cancer Brother    PE: There were no vitals taken for this visit.  There is no height or weight on file to calculate BMI. Wt Readings from Last 3 Encounters:  06/04/19 228 lb (103.4 kg)  03/20/19 234 lb 4.8 oz (106.3 kg)  02/20/19 236 lb 3.2 oz (107.1 kg)   Constitutional:  in NAD  The physical exam was not performed (virtual visit).  Assessment: 1. Normocalcemic hyperparathyroidism  2. H/o Vitamin D deficiency  3. Hypothyroidism  Plan: 1. Hyperparathyroidism  -Patient has an interesting history of mostly normal calcium levels in the past with elevated PTH levels and low vitamin D.  Her PTH levels normalized after normalizing her vitamin D level, however, calcium started to increase and remained elevated afterwards.  Her calcitriol, magnesium, phosphorus, were all normal.  Most recent calcium level was normal, but she had a high level, at 11.2 in 04/2019. -She continues vitamin D supplementation. -She has no history of nephrolithiasis and no clear history of osteoporosis based on DXA scan reports (of note 33% radius BMD was normal and higher than the rest of the scores on her that her DXA scan from 08/2016), but she has a history of pelvic fracture which qualifies her for clinical osteoporosis -She was on Xgeva for breast cancer with bone metastases >> stopped (unclear reason -reportedly tooth pb?).  She had 2 surgeries and now has recurrent metastatic breast cancer and we  discussed that we will hold off parathyroid surgery for now.  For her hypercalcemia we are following her clinically and biochemically. - I will see the patient back 1 year   2. H/o Severe vitamin D deficiency -Latest vitamin D level was normal at last visit -Continue 5000  units vitamin D daily - we will recheck the level when she can return to the clinic  3. Hypothyroidism - latest thyroid labs reviewed with pt >> normal: Lab Results  Component Value Date   TSH 1.33 06/14/2018   - she continues on LT4 125 mcg daily - pt feels good on this dose. - we discussed about taking the thyroid hormone every day, with water, >30 minutes before breakfast, separated by >4 hours from acid reflux medications, calcium, iron, multivitamins. Pt. is taking it correctly. - will check thyroid tests when she can return to the clinic: TSH and fT4  Given information about how to get the Covid vaccine.  Orders Placed This Encounter  Procedures  . TSH  . T4, free  . VITAMIN D 25 Hydroxy (Vit-D Deficiency, Fractures)   Component     Latest Ref Rng & Units 06/21/2019  TSH     0.40 - 4.50 mIU/L 2.97  T4,Free(Direct)     0.8 - 1.8 ng/dL 1.5  Vitamin D, 25-Hydroxy     30 - 100 ng/mL 49  Normal tests.  Philemon Kingdom, MD PhD [facility]

## 2019-06-20 NOTE — Patient Instructions (Addendum)
Please continue vitamin D 5000 units daily.  Please continue Levothyroxine 125 mcg daily.  Take the thyroid hormone every day, with water, at least 30 minutes before breakfast, separated by at least 4 hours from: - acid reflux medications - calcium - iron - multivitamins  Please come back for a follow-up appointment in 1 year.  COVID-19 Vaccine Information can be found at: ShippingScam.co.uk For questions related to vaccine distribution or appointments, please email [email or web address] or call [phone].

## 2019-06-21 ENCOUNTER — Other Ambulatory Visit: Payer: Self-pay

## 2019-06-21 ENCOUNTER — Other Ambulatory Visit (INDEPENDENT_AMBULATORY_CARE_PROVIDER_SITE_OTHER): Payer: Medicare Other

## 2019-06-21 DIAGNOSIS — E039 Hypothyroidism, unspecified: Secondary | ICD-10-CM

## 2019-06-21 DIAGNOSIS — E559 Vitamin D deficiency, unspecified: Secondary | ICD-10-CM | POA: Diagnosis not present

## 2019-06-21 NOTE — Addendum Note (Signed)
Addended by: Kaylyn Lim I on: 06/21/2019 04:18 PM   Modules accepted: Orders

## 2019-06-22 LAB — T4, FREE: Free T4: 1.5 ng/dL (ref 0.8–1.8)

## 2019-06-22 LAB — TSH: TSH: 2.97 mIU/L (ref 0.40–4.50)

## 2019-06-22 LAB — VITAMIN D 25 HYDROXY (VIT D DEFICIENCY, FRACTURES): Vit D, 25-Hydroxy: 49 ng/mL (ref 30–100)

## 2019-07-05 ENCOUNTER — Other Ambulatory Visit: Payer: Self-pay | Admitting: Oncology

## 2019-07-05 DIAGNOSIS — M84559P Pathological fracture in neoplastic disease, hip, unspecified, subsequent encounter for fracture with malunion: Secondary | ICD-10-CM

## 2019-07-05 MED ORDER — HYDROCODONE-ACETAMINOPHEN 5-325 MG PO TABS: 1.0000 | ORAL_TABLET | Freq: Four times a day (QID) | ORAL | 0 refills | Status: DC | PRN

## 2019-07-09 ENCOUNTER — Telehealth: Payer: Self-pay | Admitting: Podiatry

## 2019-07-09 NOTE — Telephone Encounter (Signed)
Pt called she had a little bit of bleeding and some tenderness from a nail removal 06/03/19 she has some concerns

## 2019-07-10 ENCOUNTER — Inpatient Hospital Stay: Payer: Medicare Other

## 2019-07-10 ENCOUNTER — Other Ambulatory Visit: Payer: Self-pay

## 2019-07-10 ENCOUNTER — Inpatient Hospital Stay: Payer: Medicare Other | Attending: Oncology

## 2019-07-10 VITALS — BP 139/78 | HR 84 | Temp 98.2°F | Resp 18

## 2019-07-10 DIAGNOSIS — M84659P Pathological fracture in other disease, hip, unspecified, subsequent encounter for fracture with malunion: Secondary | ICD-10-CM

## 2019-07-10 DIAGNOSIS — Z170 Estrogen receptor positive status [ER+]: Secondary | ICD-10-CM

## 2019-07-10 DIAGNOSIS — C50411 Malignant neoplasm of upper-outer quadrant of right female breast: Secondary | ICD-10-CM | POA: Diagnosis not present

## 2019-07-10 DIAGNOSIS — M8580 Other specified disorders of bone density and structure, unspecified site: Secondary | ICD-10-CM

## 2019-07-10 DIAGNOSIS — C7951 Secondary malignant neoplasm of bone: Secondary | ICD-10-CM

## 2019-07-10 DIAGNOSIS — D6859 Other primary thrombophilia: Secondary | ICD-10-CM

## 2019-07-10 DIAGNOSIS — I82401 Acute embolism and thrombosis of unspecified deep veins of right lower extremity: Secondary | ICD-10-CM

## 2019-07-10 DIAGNOSIS — Z79818 Long term (current) use of other agents affecting estrogen receptors and estrogen levels: Secondary | ICD-10-CM | POA: Diagnosis not present

## 2019-07-10 DIAGNOSIS — I700 Atherosclerosis of aorta: Secondary | ICD-10-CM

## 2019-07-10 DIAGNOSIS — M84459A Pathological fracture, hip, unspecified, initial encounter for fracture: Secondary | ICD-10-CM

## 2019-07-10 DIAGNOSIS — M899 Disorder of bone, unspecified: Secondary | ICD-10-CM

## 2019-07-10 LAB — CBC WITH DIFFERENTIAL/PLATELET
Abs Immature Granulocytes: 0.03 10*3/uL (ref 0.00–0.07)
Basophils Absolute: 0.1 10*3/uL (ref 0.0–0.1)
Basophils Relative: 1 %
Eosinophils Absolute: 0.3 10*3/uL (ref 0.0–0.5)
Eosinophils Relative: 4 %
HCT: 40.6 % (ref 36.0–46.0)
Hemoglobin: 12.8 g/dL (ref 12.0–15.0)
Immature Granulocytes: 0 %
Lymphocytes Relative: 14 %
Lymphs Abs: 1.1 10*3/uL (ref 0.7–4.0)
MCH: 28.6 pg (ref 26.0–34.0)
MCHC: 31.5 g/dL (ref 30.0–36.0)
MCV: 90.8 fL (ref 80.0–100.0)
Monocytes Absolute: 0.8 10*3/uL (ref 0.1–1.0)
Monocytes Relative: 10 %
Neutro Abs: 5.5 10*3/uL (ref 1.7–7.7)
Neutrophils Relative %: 71 %
Platelets: 253 10*3/uL (ref 150–400)
RBC: 4.47 MIL/uL (ref 3.87–5.11)
RDW: 15.2 % (ref 11.5–15.5)
WBC: 7.8 10*3/uL (ref 4.0–10.5)
nRBC: 0 % (ref 0.0–0.2)

## 2019-07-10 LAB — COMPREHENSIVE METABOLIC PANEL
ALT: 16 U/L (ref 0–44)
AST: 12 U/L — ABNORMAL LOW (ref 15–41)
Albumin: 3.8 g/dL (ref 3.5–5.0)
Alkaline Phosphatase: 172 U/L — ABNORMAL HIGH (ref 38–126)
Anion gap: 9 (ref 5–15)
BUN: 17 mg/dL (ref 8–23)
CO2: 27 mmol/L (ref 22–32)
Calcium: 10.6 mg/dL — ABNORMAL HIGH (ref 8.9–10.3)
Chloride: 106 mmol/L (ref 98–111)
Creatinine, Ser: 0.81 mg/dL (ref 0.44–1.00)
GFR calc Af Amer: >60 mL/min (ref >60)
GFR calc non Af Amer: >60 mL/min (ref >60)
Glucose, Bld: 148 mg/dL — ABNORMAL HIGH (ref 70–99)
Potassium: 3.9 mmol/L (ref 3.5–5.1)
Sodium: 142 mmol/L (ref 135–145)
Total Bilirubin: 0.3 mg/dL (ref 0.3–1.2)
Total Protein: 7 g/dL (ref 6.5–8.1)

## 2019-07-10 LAB — PROTIME-INR
INR: 1.7 — ABNORMAL HIGH (ref 0.8–1.2)
Prothrombin Time: 19.6 seconds — ABNORMAL HIGH (ref 11.4–15.2)

## 2019-07-10 MED ORDER — FULVESTRANT 250 MG/5ML IM SOLN
INTRAMUSCULAR | Status: AC
  Filled 2019-07-10: qty 10

## 2019-07-10 MED ORDER — FULVESTRANT 250 MG/5ML IM SOLN
500.0000 mg | Freq: Once | INTRAMUSCULAR | Status: AC
  Administered 2019-07-10: 15:00:00 500 mg via INTRAMUSCULAR

## 2019-07-10 NOTE — Patient Instructions (Signed)
Fulvestrant injection What is this medicine? FULVESTRANT (ful VES trant) blocks the effects of estrogen. It is used to treat breast cancer. This medicine may be used for other purposes; ask your health care provider or pharmacist if you have questions. COMMON BRAND NAME(S): FASLODEX What should I tell my health care provider before I take this medicine? They need to know if you have any of these conditions:  bleeding disorders  liver disease  low blood counts, like low white cell, platelet, or red cell counts  an unusual or allergic reaction to fulvestrant, other medicines, foods, dyes, or preservatives  pregnant or trying to get pregnant  breast-feeding How should I use this medicine? This medicine is for injection into a muscle. It is usually given by a health care professional in a hospital or clinic setting. Talk to your pediatrician regarding the use of this medicine in children. Special care may be needed. Overdosage: If you think you have taken too much of this medicine contact a poison control center or emergency room at once. NOTE: This medicine is only for you. Do not share this medicine with others. What if I miss a dose? It is important not to miss your dose. Call your doctor or health care professional if you are unable to keep an appointment. What may interact with this medicine?  medicines that treat or prevent blood clots like warfarin, enoxaparin, dalteparin, apixaban, dabigatran, and rivaroxaban This list may not describe all possible interactions. Give your health care provider a list of all the medicines, herbs, non-prescription drugs, or dietary supplements you use. Also tell them if you smoke, drink alcohol, or use illegal drugs. Some items may interact with your medicine. What should I watch for while using this medicine? Your condition will be monitored carefully while you are receiving this medicine. You will need important blood work done while you are taking  this medicine. Do not become pregnant while taking this medicine or for at least 1 year after stopping it. Women of child-bearing potential will need to have a negative pregnancy test before starting this medicine. Women should inform their doctor if they wish to become pregnant or think they might be pregnant. There is a potential for serious side effects to an unborn child. Men should inform their doctors if they wish to father a child. This medicine may lower sperm counts. Talk to your health care professional or pharmacist for more information. Do not breast-feed an infant while taking this medicine or for 1 year after the last dose. What side effects may I notice from receiving this medicine? Side effects that you should report to your doctor or health care professional as soon as possible:  allergic reactions like skin rash, itching or hives, swelling of the face, lips, or tongue  feeling faint or lightheaded, falls  pain, tingling, numbness, or weakness in the legs  signs and symptoms of infection like fever or chills; cough; flu-like symptoms; sore throat  vaginal bleeding Side effects that usually do not require medical attention (report to your doctor or health care professional if they continue or are bothersome):  aches, pains  constipation  diarrhea  headache  hot flashes  nausea, vomiting  pain at site where injected  stomach pain This list may not describe all possible side effects. Call your doctor for medical advice about side effects. You may report side effects to FDA at 1-800-FDA-1088. Where should I keep my medicine? This drug is given in a hospital or clinic and will   not be stored at home. NOTE: This sheet is a summary. It may not cover all possible information. If you have questions about this medicine, talk to your doctor, pharmacist, or health care provider.  2020 Elsevier/Gold Standard (2017-08-24 11:34:41)  

## 2019-07-11 ENCOUNTER — Telehealth: Payer: Self-pay | Admitting: *Deleted

## 2019-07-11 LAB — CANCER ANTIGEN 27.29: CA 27.29: 36.9 U/mL (ref 0.0–38.6)

## 2019-07-11 NOTE — Telephone Encounter (Signed)
Returned patient's phone call at [phone] (cell #) and left a voicemail message for patient.  Patient had a nail removed by Dr. [name] on 06/03/19 and had some concerns regarding nail having a little bit of bleeding and some tenderness.  I left a message for patient stating that it was normal to have tenderness and some light bleeding and clear drainage up to 2 months. To continue to soak with epsom salt.   I told patient that they could call me back tomorrow, Friday, July 12, 2019 if they had any further questions.  Waiting for a response.

## 2019-07-12 NOTE — Telephone Encounter (Signed)
Sent to Dr. Prudence Davidson in error

## 2019-07-12 NOTE — Telephone Encounter (Signed)
Returned and left a voicemail message for patient yesterday letting them know that it was normal to see some light bleeding and drainage from their toenail removal for up to two full months.  I recommended for patient to continue to soak her toe in epsom salt until they did not see any more drainage coming from the toe.

## 2019-07-22 NOTE — Telephone Encounter (Signed)
Left message with my email address and that often cellphones and emails don't hook up and if she does not get a confirmation that the email was received she should contact our office for an appt.

## 2019-07-22 NOTE — Telephone Encounter (Signed)
Pt states she may have copied the email down wrong.

## 2019-07-22 NOTE — Telephone Encounter (Signed)
I spoke with pt and asked how her toenail procedure was doing and she states it has a hard darkened area that is very sore and the back of her foot is grayish. Pt states due to the cancer and the rain she didn't think she wanted to come in today. I asked if she had someone in the home that could send me a picture of the toe and the foot and I gave her my email address. She said she would get her friend to do so.

## 2019-07-23 NOTE — Telephone Encounter (Signed)
Left message informing pt that I did not get the foot and toe photos in my email, and if she would like to discuss contact me or I would prefer she schedule an appt to be evaluated.

## 2019-08-01 ENCOUNTER — Other Ambulatory Visit: Payer: Self-pay | Admitting: Oncology

## 2019-08-01 DIAGNOSIS — E039 Hypothyroidism, unspecified: Secondary | ICD-10-CM

## 2019-08-07 ENCOUNTER — Inpatient Hospital Stay: Payer: Medicare Other | Attending: Oncology

## 2019-08-07 ENCOUNTER — Other Ambulatory Visit: Payer: Self-pay | Admitting: Oncology

## 2019-08-07 ENCOUNTER — Telehealth: Payer: Self-pay | Admitting: *Deleted

## 2019-08-07 ENCOUNTER — Inpatient Hospital Stay: Payer: Medicare Other

## 2019-08-07 ENCOUNTER — Other Ambulatory Visit: Payer: Self-pay

## 2019-08-07 VITALS — BP 128/72 | HR 78 | Temp 98.1°F | Resp 18

## 2019-08-07 DIAGNOSIS — Z79818 Long term (current) use of other agents affecting estrogen receptors and estrogen levels: Secondary | ICD-10-CM | POA: Diagnosis not present

## 2019-08-07 DIAGNOSIS — Z170 Estrogen receptor positive status [ER+]: Secondary | ICD-10-CM | POA: Diagnosis not present

## 2019-08-07 DIAGNOSIS — M899 Disorder of bone, unspecified: Secondary | ICD-10-CM

## 2019-08-07 DIAGNOSIS — M8580 Other specified disorders of bone density and structure, unspecified site: Secondary | ICD-10-CM

## 2019-08-07 DIAGNOSIS — C50411 Malignant neoplasm of upper-outer quadrant of right female breast: Secondary | ICD-10-CM

## 2019-08-07 DIAGNOSIS — I82401 Acute embolism and thrombosis of unspecified deep veins of right lower extremity: Secondary | ICD-10-CM

## 2019-08-07 DIAGNOSIS — I700 Atherosclerosis of aorta: Secondary | ICD-10-CM

## 2019-08-07 DIAGNOSIS — M84559P Pathological fracture in neoplastic disease, hip, unspecified, subsequent encounter for fracture with malunion: Secondary | ICD-10-CM

## 2019-08-07 DIAGNOSIS — C7951 Secondary malignant neoplasm of bone: Secondary | ICD-10-CM

## 2019-08-07 DIAGNOSIS — M84459A Pathological fracture, hip, unspecified, initial encounter for fracture: Secondary | ICD-10-CM

## 2019-08-07 DIAGNOSIS — M84659P Pathological fracture in other disease, hip, unspecified, subsequent encounter for fracture with malunion: Secondary | ICD-10-CM

## 2019-08-07 DIAGNOSIS — D6859 Other primary thrombophilia: Secondary | ICD-10-CM

## 2019-08-07 LAB — CBC WITH DIFFERENTIAL/PLATELET
Abs Immature Granulocytes: 0.01 10*3/uL (ref 0.00–0.07)
Basophils Absolute: 0.1 10*3/uL (ref 0.0–0.1)
Basophils Relative: 1 %
Eosinophils Absolute: 0.3 10*3/uL (ref 0.0–0.5)
Eosinophils Relative: 4 %
HCT: 41.5 % (ref 36.0–46.0)
Hemoglobin: 13.1 g/dL (ref 12.0–15.0)
Immature Granulocytes: 0 %
Lymphocytes Relative: 12 %
Lymphs Abs: 1 10*3/uL (ref 0.7–4.0)
MCH: 28.8 pg (ref 26.0–34.0)
MCHC: 31.6 g/dL (ref 30.0–36.0)
MCV: 91.2 fL (ref 80.0–100.0)
Monocytes Absolute: 0.7 10*3/uL (ref 0.1–1.0)
Monocytes Relative: 9 %
Neutro Abs: 6.3 10*3/uL (ref 1.7–7.7)
Neutrophils Relative %: 74 %
Platelets: 240 10*3/uL (ref 150–400)
RBC: 4.55 MIL/uL (ref 3.87–5.11)
RDW: 15.1 % (ref 11.5–15.5)
WBC: 8.5 10*3/uL (ref 4.0–10.5)
nRBC: 0 % (ref 0.0–0.2)

## 2019-08-07 LAB — COMPREHENSIVE METABOLIC PANEL
ALT: 14 U/L (ref 0–44)
AST: 13 U/L — ABNORMAL LOW (ref 15–41)
Albumin: 3.8 g/dL (ref 3.5–5.0)
Alkaline Phosphatase: 157 U/L — ABNORMAL HIGH (ref 38–126)
Anion gap: 8 (ref 5–15)
BUN: 13 mg/dL (ref 8–23)
CO2: 28 mmol/L (ref 22–32)
Calcium: 10.9 mg/dL — ABNORMAL HIGH (ref 8.9–10.3)
Chloride: 105 mmol/L (ref 98–111)
Creatinine, Ser: 0.89 mg/dL (ref 0.44–1.00)
GFR calc Af Amer: >60 mL/min (ref >60)
GFR calc non Af Amer: >60 mL/min (ref >60)
Glucose, Bld: 153 mg/dL — ABNORMAL HIGH (ref 70–99)
Potassium: 4.4 mmol/L (ref 3.5–5.1)
Sodium: 141 mmol/L (ref 135–145)
Total Bilirubin: 0.3 mg/dL (ref 0.3–1.2)
Total Protein: 6.8 g/dL (ref 6.5–8.1)

## 2019-08-07 LAB — PROTIME-INR
INR: 4.2 (ref 0.8–1.2)
Prothrombin Time: 40.6 seconds — ABNORMAL HIGH (ref 11.4–15.2)

## 2019-08-07 MED ORDER — FULVESTRANT 250 MG/5ML IM SOLN
INTRAMUSCULAR | Status: AC
  Filled 2019-08-07: qty 10

## 2019-08-07 MED ORDER — HYDROCODONE-ACETAMINOPHEN 5-325 MG PO TABS: 1.0000 | ORAL_TABLET | Freq: Four times a day (QID) | ORAL | 0 refills | Status: DC | PRN

## 2019-08-07 MED ORDER — FULVESTRANT 250 MG/5ML IM SOLN
500.0000 mg | Freq: Once | INTRAMUSCULAR | Status: AC
  Administered 2019-08-07: 500 mg via INTRAMUSCULAR

## 2019-08-07 NOTE — Telephone Encounter (Signed)
This RN spoke with Alicia Vasquez per INR level today of 4.2. Verified current coumadin dose as 7.5mg  alt 10mg .  Instructed her per MD review to hold her coumadin for 2 days ( she has not taken it today ) and to restart Friday pm at 7.5 mg daily.  Alicia Vasquez verbalized understanding.  This RN also scheduled lab in 2 weeks for INR only per MD request.  No further needs at this time.

## 2019-08-07 NOTE — Progress Notes (Unsigned)
CRITICAL INR (4.2) CALLED TO VAL DODD AT 1407.RB

## 2019-08-07 NOTE — Patient Instructions (Signed)
Fulvestrant injection What is this medicine? FULVESTRANT (ful VES trant) blocks the effects of estrogen. It is used to treat breast cancer. This medicine may be used for other purposes; ask your health care provider or pharmacist if you have questions. COMMON BRAND NAME(S): FASLODEX What should I tell my health care provider before I take this medicine? They need to know if you have any of these conditions:  bleeding disorders  liver disease  low blood counts, like low white cell, platelet, or red cell counts  an unusual or allergic reaction to fulvestrant, other medicines, foods, dyes, or preservatives  pregnant or trying to get pregnant  breast-feeding How should I use this medicine? This medicine is for injection into a muscle. It is usually given by a health care professional in a hospital or clinic setting. Talk to your pediatrician regarding the use of this medicine in children. Special care may be needed. Overdosage: If you think you have taken too much of this medicine contact a poison control center or emergency room at once. NOTE: This medicine is only for you. Do not share this medicine with others. What if I miss a dose? It is important not to miss your dose. Call your doctor or health care professional if you are unable to keep an appointment. What may interact with this medicine?  medicines that treat or prevent blood clots like warfarin, enoxaparin, dalteparin, apixaban, dabigatran, and rivaroxaban This list may not describe all possible interactions. Give your health care provider a list of all the medicines, herbs, non-prescription drugs, or dietary supplements you use. Also tell them if you smoke, drink alcohol, or use illegal drugs. Some items may interact with your medicine. What should I watch for while using this medicine? Your condition will be monitored carefully while you are receiving this medicine. You will need important blood work done while you are taking  this medicine. Do not become pregnant while taking this medicine or for at least 1 year after stopping it. Women of child-bearing potential will need to have a negative pregnancy test before starting this medicine. Women should inform their doctor if they wish to become pregnant or think they might be pregnant. There is a potential for serious side effects to an unborn child. Men should inform their doctors if they wish to father a child. This medicine may lower sperm counts. Talk to your health care professional or pharmacist for more information. Do not breast-feed an infant while taking this medicine or for 1 year after the last dose. What side effects may I notice from receiving this medicine? Side effects that you should report to your doctor or health care professional as soon as possible:  allergic reactions like skin rash, itching or hives, swelling of the face, lips, or tongue  feeling faint or lightheaded, falls  pain, tingling, numbness, or weakness in the legs  signs and symptoms of infection like fever or chills; cough; flu-like symptoms; sore throat  vaginal bleeding Side effects that usually do not require medical attention (report to your doctor or health care professional if they continue or are bothersome):  aches, pains  constipation  diarrhea  headache  hot flashes  nausea, vomiting  pain at site where injected  stomach pain This list may not describe all possible side effects. Call your doctor for medical advice about side effects. You may report side effects to FDA at 1-800-FDA-1088. Where should I keep my medicine? This drug is given in a hospital or clinic and will   not be stored at home. NOTE: This sheet is a summary. It may not cover all possible information. If you have questions about this medicine, talk to your doctor, pharmacist, or health care provider.  2020 Elsevier/Gold Standard (2017-08-24 11:34:41)  

## 2019-08-08 LAB — CANCER ANTIGEN 27.29: CA 27.29: 42.6 U/mL — ABNORMAL HIGH (ref 0.0–38.6)

## 2019-08-28 ENCOUNTER — Other Ambulatory Visit: Payer: Self-pay

## 2019-08-28 ENCOUNTER — Inpatient Hospital Stay: Payer: Medicare Other

## 2019-08-28 DIAGNOSIS — M8580 Other specified disorders of bone density and structure, unspecified site: Secondary | ICD-10-CM

## 2019-08-28 DIAGNOSIS — D6859 Other primary thrombophilia: Secondary | ICD-10-CM

## 2019-08-28 DIAGNOSIS — Z170 Estrogen receptor positive status [ER+]: Secondary | ICD-10-CM | POA: Diagnosis not present

## 2019-08-28 DIAGNOSIS — I700 Atherosclerosis of aorta: Secondary | ICD-10-CM

## 2019-08-28 DIAGNOSIS — C7951 Secondary malignant neoplasm of bone: Secondary | ICD-10-CM

## 2019-08-28 DIAGNOSIS — C50411 Malignant neoplasm of upper-outer quadrant of right female breast: Secondary | ICD-10-CM | POA: Diagnosis not present

## 2019-08-28 DIAGNOSIS — Z79818 Long term (current) use of other agents affecting estrogen receptors and estrogen levels: Secondary | ICD-10-CM | POA: Diagnosis not present

## 2019-08-28 DIAGNOSIS — G893 Neoplasm related pain (acute) (chronic): Secondary | ICD-10-CM

## 2019-08-28 DIAGNOSIS — I82401 Acute embolism and thrombosis of unspecified deep veins of right lower extremity: Secondary | ICD-10-CM

## 2019-08-28 DIAGNOSIS — M84459A Pathological fracture, hip, unspecified, initial encounter for fracture: Secondary | ICD-10-CM

## 2019-08-28 LAB — CBC WITH DIFFERENTIAL/PLATELET
Abs Immature Granulocytes: 0.04 10*3/uL (ref 0.00–0.07)
Basophils Absolute: 0.1 10*3/uL (ref 0.0–0.1)
Basophils Relative: 1 %
Eosinophils Absolute: 0.3 10*3/uL (ref 0.0–0.5)
Eosinophils Relative: 3 %
HCT: 41.5 % (ref 36.0–46.0)
Hemoglobin: 13.2 g/dL (ref 12.0–15.0)
Immature Granulocytes: 1 %
Lymphocytes Relative: 12 %
Lymphs Abs: 0.9 10*3/uL (ref 0.7–4.0)
MCH: 28.4 pg (ref 26.0–34.0)
MCHC: 31.8 g/dL (ref 30.0–36.0)
MCV: 89.2 fL (ref 80.0–100.0)
Monocytes Absolute: 0.9 10*3/uL (ref 0.1–1.0)
Monocytes Relative: 12 %
Neutro Abs: 5.8 10*3/uL (ref 1.7–7.7)
Neutrophils Relative %: 71 %
Platelets: 238 10*3/uL (ref 150–400)
RBC: 4.65 MIL/uL (ref 3.87–5.11)
RDW: 15 % (ref 11.5–15.5)
WBC: 8 10*3/uL (ref 4.0–10.5)
nRBC: 0 % (ref 0.0–0.2)

## 2019-08-28 LAB — PROTIME-INR
INR: 2 — ABNORMAL HIGH (ref 0.8–1.2)
Prothrombin Time: 22.4 seconds — ABNORMAL HIGH (ref 11.4–15.2)

## 2019-08-29 LAB — CANCER ANTIGEN 27.29: CA 27.29: 36.7 U/mL (ref 0.0–38.6)

## 2019-09-03 ENCOUNTER — Ambulatory Visit: Payer: Medicare Other | Admitting: Podiatry

## 2019-09-04 ENCOUNTER — Inpatient Hospital Stay: Payer: Medicare Other

## 2019-09-04 ENCOUNTER — Inpatient Hospital Stay: Payer: Medicare Other | Attending: Oncology

## 2019-09-04 ENCOUNTER — Other Ambulatory Visit: Payer: Self-pay

## 2019-09-04 VITALS — BP 126/72 | HR 72 | Temp 98.2°F | Resp 18

## 2019-09-04 DIAGNOSIS — Z800 Family history of malignant neoplasm of digestive organs: Secondary | ICD-10-CM | POA: Insufficient documentation

## 2019-09-04 DIAGNOSIS — Z923 Personal history of irradiation: Secondary | ICD-10-CM | POA: Insufficient documentation

## 2019-09-04 DIAGNOSIS — M8580 Other specified disorders of bone density and structure, unspecified site: Secondary | ICD-10-CM

## 2019-09-04 DIAGNOSIS — E039 Hypothyroidism, unspecified: Secondary | ICD-10-CM | POA: Insufficient documentation

## 2019-09-04 DIAGNOSIS — C7951 Secondary malignant neoplasm of bone: Secondary | ICD-10-CM

## 2019-09-04 DIAGNOSIS — M899 Disorder of bone, unspecified: Secondary | ICD-10-CM

## 2019-09-04 DIAGNOSIS — Z79899 Other long term (current) drug therapy: Secondary | ICD-10-CM | POA: Insufficient documentation

## 2019-09-04 DIAGNOSIS — M84659P Pathological fracture in other disease, hip, unspecified, subsequent encounter for fracture with malunion: Secondary | ICD-10-CM | POA: Diagnosis not present

## 2019-09-04 DIAGNOSIS — Z8249 Family history of ischemic heart disease and other diseases of the circulatory system: Secondary | ICD-10-CM | POA: Insufficient documentation

## 2019-09-04 DIAGNOSIS — Z8042 Family history of malignant neoplasm of prostate: Secondary | ICD-10-CM | POA: Diagnosis not present

## 2019-09-04 DIAGNOSIS — C50411 Malignant neoplasm of upper-outer quadrant of right female breast: Secondary | ICD-10-CM

## 2019-09-04 DIAGNOSIS — Z170 Estrogen receptor positive status [ER+]: Secondary | ICD-10-CM | POA: Diagnosis not present

## 2019-09-04 DIAGNOSIS — M84459A Pathological fracture, hip, unspecified, initial encounter for fracture: Secondary | ICD-10-CM

## 2019-09-04 DIAGNOSIS — Z5111 Encounter for antineoplastic chemotherapy: Secondary | ICD-10-CM | POA: Diagnosis not present

## 2019-09-04 DIAGNOSIS — I700 Atherosclerosis of aorta: Secondary | ICD-10-CM

## 2019-09-04 DIAGNOSIS — D6859 Other primary thrombophilia: Secondary | ICD-10-CM

## 2019-09-04 DIAGNOSIS — Z7901 Long term (current) use of anticoagulants: Secondary | ICD-10-CM | POA: Insufficient documentation

## 2019-09-04 DIAGNOSIS — I82401 Acute embolism and thrombosis of unspecified deep veins of right lower extremity: Secondary | ICD-10-CM

## 2019-09-04 LAB — CBC WITH DIFFERENTIAL/PLATELET
Abs Immature Granulocytes: 0.02 10*3/uL (ref 0.00–0.07)
Basophils Absolute: 0.1 10*3/uL (ref 0.0–0.1)
Basophils Relative: 1 %
Eosinophils Absolute: 0.3 10*3/uL (ref 0.0–0.5)
Eosinophils Relative: 3 %
HCT: 41.7 % (ref 36.0–46.0)
Hemoglobin: 13.2 g/dL (ref 12.0–15.0)
Immature Granulocytes: 0 %
Lymphocytes Relative: 11 %
Lymphs Abs: 1 10*3/uL (ref 0.7–4.0)
MCH: 28.8 pg (ref 26.0–34.0)
MCHC: 31.7 g/dL (ref 30.0–36.0)
MCV: 90.8 fL (ref 80.0–100.0)
Monocytes Absolute: 1.1 10*3/uL — ABNORMAL HIGH (ref 0.1–1.0)
Monocytes Relative: 11 %
Neutro Abs: 7 10*3/uL (ref 1.7–7.7)
Neutrophils Relative %: 74 %
Platelets: 239 10*3/uL (ref 150–400)
RBC: 4.59 MIL/uL (ref 3.87–5.11)
RDW: 15.2 % (ref 11.5–15.5)
WBC: 9.5 10*3/uL (ref 4.0–10.5)
nRBC: 0 % (ref 0.0–0.2)

## 2019-09-04 LAB — PROTIME-INR
INR: 1.7 — ABNORMAL HIGH (ref 0.8–1.2)
Prothrombin Time: 19.5 seconds — ABNORMAL HIGH (ref 11.4–15.2)

## 2019-09-04 MED ORDER — FULVESTRANT 250 MG/5ML IM SOLN
500.0000 mg | Freq: Once | INTRAMUSCULAR | Status: AC
  Administered 2019-09-04: 14:00:00 500 mg via INTRAMUSCULAR

## 2019-09-04 MED ORDER — FULVESTRANT 250 MG/5ML IM SOLN
INTRAMUSCULAR | Status: AC
  Filled 2019-09-04: qty 10

## 2019-09-04 NOTE — Patient Instructions (Signed)
Fulvestrant injection What is this medicine? FULVESTRANT (ful VES trant) blocks the effects of estrogen. It is used to treat breast cancer. This medicine may be used for other purposes; ask your health care provider or pharmacist if you have questions. COMMON BRAND NAME(S): FASLODEX What should I tell my health care provider before I take this medicine? They need to know if you have any of these conditions:  bleeding disorders  liver disease  low blood counts, like low white cell, platelet, or red cell counts  an unusual or allergic reaction to fulvestrant, other medicines, foods, dyes, or preservatives  pregnant or trying to get pregnant  breast-feeding How should I use this medicine? This medicine is for injection into a muscle. It is usually given by a health care professional in a hospital or clinic setting. Talk to your pediatrician regarding the use of this medicine in children. Special care may be needed. Overdosage: If you think you have taken too much of this medicine contact a poison control center or emergency room at once. NOTE: This medicine is only for you. Do not share this medicine with others. What if I miss a dose? It is important not to miss your dose. Call your doctor or health care professional if you are unable to keep an appointment. What may interact with this medicine?  medicines that treat or prevent blood clots like warfarin, enoxaparin, dalteparin, apixaban, dabigatran, and rivaroxaban This list may not describe all possible interactions. Give your health care provider a list of all the medicines, herbs, non-prescription drugs, or dietary supplements you use. Also tell them if you smoke, drink alcohol, or use illegal drugs. Some items may interact with your medicine. What should I watch for while using this medicine? Your condition will be monitored carefully while you are receiving this medicine. You will need important blood work done while you are taking  this medicine. Do not become pregnant while taking this medicine or for at least 1 year after stopping it. Women of child-bearing potential will need to have a negative pregnancy test before starting this medicine. Women should inform their doctor if they wish to become pregnant or think they might be pregnant. There is a potential for serious side effects to an unborn child. Men should inform their doctors if they wish to father a child. This medicine may lower sperm counts. Talk to your health care professional or pharmacist for more information. Do not breast-feed an infant while taking this medicine or for 1 year after the last dose. What side effects may I notice from receiving this medicine? Side effects that you should report to your doctor or health care professional as soon as possible:  allergic reactions like skin rash, itching or hives, swelling of the face, lips, or tongue  feeling faint or lightheaded, falls  pain, tingling, numbness, or weakness in the legs  signs and symptoms of infection like fever or chills; cough; flu-like symptoms; sore throat  vaginal bleeding Side effects that usually do not require medical attention (report to your doctor or health care professional if they continue or are bothersome):  aches, pains  constipation  diarrhea  headache  hot flashes  nausea, vomiting  pain at site where injected  stomach pain This list may not describe all possible side effects. Call your doctor for medical advice about side effects. You may report side effects to FDA at 1-800-FDA-1088. Where should I keep my medicine? This drug is given in a hospital or clinic and will   not be stored at home. NOTE: This sheet is a summary. It may not cover all possible information. If you have questions about this medicine, talk to your doctor, pharmacist, or health care provider.  2020 Elsevier/Gold Standard (2017-08-24 11:34:41)  

## 2019-09-05 LAB — CANCER ANTIGEN 27.29: CA 27.29: 36.4 U/mL (ref 0.0–38.6)

## 2019-09-12 ENCOUNTER — Encounter: Payer: Self-pay | Admitting: Podiatry

## 2019-09-12 ENCOUNTER — Ambulatory Visit (INDEPENDENT_AMBULATORY_CARE_PROVIDER_SITE_OTHER): Payer: Medicare Other | Admitting: Podiatry

## 2019-09-12 ENCOUNTER — Other Ambulatory Visit: Payer: Self-pay

## 2019-09-12 DIAGNOSIS — M79675 Pain in left toe(s): Secondary | ICD-10-CM | POA: Diagnosis not present

## 2019-09-12 DIAGNOSIS — M79674 Pain in right toe(s): Secondary | ICD-10-CM

## 2019-09-12 DIAGNOSIS — D689 Coagulation defect, unspecified: Secondary | ICD-10-CM

## 2019-09-12 DIAGNOSIS — B351 Tinea unguium: Secondary | ICD-10-CM

## 2019-09-12 DIAGNOSIS — L03031 Cellulitis of right toe: Secondary | ICD-10-CM | POA: Diagnosis not present

## 2019-09-12 NOTE — Progress Notes (Signed)
Subjective:   Patient ID: Alicia Vasquez, female   DOB: 79 y.o.   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

## 2019-09-16 ENCOUNTER — Other Ambulatory Visit: Payer: Self-pay | Admitting: Oncology

## 2019-09-16 DIAGNOSIS — M84559P Pathological fracture in neoplastic disease, hip, unspecified, subsequent encounter for fracture with malunion: Secondary | ICD-10-CM

## 2019-09-16 MED ORDER — HYDROCODONE-ACETAMINOPHEN 5-325 MG PO TABS: 1.0000 | ORAL_TABLET | Freq: Four times a day (QID) | ORAL | 0 refills | Status: DC | PRN

## 2019-10-01 NOTE — Progress Notes (Signed)
[place]  [phone] [phone]     ID: Alicia Vasquez OB: 1940-08-07  MR#: [id number]  MEQ#:[id number]  Patient Care Team: Alicia Deutscher, DO as PCP - General (Family Medicine) Alicia Kingdom, MD as Consulting Physician (Internal Medicine) Alicia Renshaw, MD as Referring Physician (Internal Medicine) Alicia Vasquez, Alicia Dad, MD as Consulting Physician (Oncology) Alicia Vasquez, DDS as Consulting Physician (Dentistry) Alicia Vasquez, [place] as Consulting Physician (Physician Assistant) Alicia Vasquez, DPM as Consulting Physician (Podiatry) OTHER MD:   CHIEF COMPLAINT: Estrogen receptor positive breast cancer  CURRENT TREATMENT:  Fulvestrant; warfarin   INTERVAL HISTORY: Alicia Vasquez returns today for follow-up and treatment of her metastatic estrogen receptor positive breast cancer.  I have let her know that it is possible now for her to bring against since her friend on host Alicia Vasquez used to come in with her and that was helpful in helping Alicia Vasquez take her medicine as prescribed  She continues on fulvestrant.  She is tolerating this well.  Checking PMP aware shows that we are the only ones prescribing medication for this patient and she only uses one pharmacy.  Her pain is well controlled on her current dose of hydrocodone/APAP  REVIEW OF SYSTEMS: Alicia Vasquez tells me she is very concerned about receiving the coronavirus vaccine.  She has allergies to the flu vaccine. She wonders if the vaccine might interact with some of her other treatments.  Alicia Vasquez who she lives with has had the vaccine but Alicia Vasquez's daughter and her two 44 year old twins I have not.  Alicia Vasquez is severely limited in mobility because of her claudication.  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  She has had no intercurrent bleeding that she is aware of.  Her pain is very well controlled on her current doses of hydrocodone/acetaminophen   BREAST CANCER HISTORY: From doctor Alicia Vasquez's intake node 07/24/2013:  "78 y.o. female. Who presented with SOB and had a CT chest performed that revealed a right breast mass. Mammogram/ultrsound on 2/13 showed a mass in the 11 o'clock position in the right breast measuring 1.5 cm. Also noted was a right axillary LN measuring 1.6 cm. MRI not performed. Biopsy of mass and lymph done. 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 Lymph node + for metastatic carcinoma."  [On 09/09/2013 the patient underwent right lumpectomy and sentinel lymph node sampling. This showed ([id number]) multifocal invasive ductal carcinoma, grade 1, the largest lesion measuring 1.8 cm, the second lesion 1.2 cm. One of 4 sentinel lymph nodes was positive, with extracapsular extension. Margins were positive. HER-2 was repeated and was again negative. Further surgery 09/16/2013 obtained clear margins.  Her subsequent history is as detailed below   PAST MEDICAL HISTORY: Past Medical History:  Diagnosis Date  . Allergy   . Anxiety   . Arthritis   . Blood transfusion without reported diagnosis   . Breast cancer ([place]) 07/12/2013   Invasive Mammary Carcinoma  . DVT (deep vein thrombosis) in pregnancy   . Hypertension   . Hypothyroid   . Metastatic cancer to bone ([place]) dx'd 12/2016   hip  . Personal history of radiation therapy   . Pneumonia   . Radiation 11/21/13-01/07/14   Right Breast/Supraclavicular    PAST SURGICAL HISTORY: Past Surgical History:  Procedure Laterality Date  . BREAST LUMPECTOMY Left 2015  . BREAST LUMPECTOMY WITH  RADIOACTIVE SEED LOCALIZATION Right 09/09/2013   Procedure: BREAST LUMPECTOMY WITH RADIOACTIVE SEED LOCALIZATION WITH AXILLARY NODE EXCISION;  Surgeon: Alicia Bookbinder, MD;  Location: [facility];  Service: General;  Laterality: Right;  . DENTAL SURGERY   04/19/2012   13 TEETH REMOVED  . DILATION AND CURETTAGE OF UTERUS    . ORIF PERIPROSTHETIC FRACTURE Left 01/31/2017   Procedure: REVISION and OPEN REDUCTION INTERNAL FIXATION (ORIF) PERIPROSTHETIC FRACTURE LEFT HIP;  Surgeon: Alicia Cancel, MD;  Location: [facility];  Service: Orthopedics;  Laterality: Left;  120 mins  . RE-EXCISION OF BREAST LUMPECTOMY Right 09/24/2013   Procedure: RE-EXCISION OF RIGHT BREAST LUMPECTOMY;  Surgeon: Alicia Bookbinder, MD;  Location: [facility];  Service: General;  Laterality: Right;  . TOTAL HIP ARTHROPLASTY Left 01/16/2017   Procedure: TOTAL HIP ARTHROPLASTY POSTERIOR;  Surgeon: Alicia Cancel, MD;  Location: [facility];  Service: Orthopedics;  Laterality: Left;    FAMILY HISTORY Family History  Problem Relation Age of Onset  . Heart disease Brother   . Colon cancer Brother   . Prostate cancer Brother    the patient's father died at the age of 54 after an automobile accident. The patient's mother died at the age of 57. She was a Marine scientist here in [name] in the old [facility]. She was infected with polio and was confined to a wheelchair for a good part of her life. She eventually died of pneumonia. The patient had one brother, who died with prostate cancer. She had no sisters. There is no history of breast or ovarian cancer in the family.   GYNECOLOGIC HISTORY:  Menarche age 76, first live birth age 47, the patient is GX P1. She went through the change of life at age 89. 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 She is widowed. Currently she is staying with her friend Alicia Vasquez, 46 years old, who is a retired Radio producer.  Alicia Vasquez's daughter and 2 grandsons are also currently in the home.  The patient's son Alicia Vasquez lived in [place] but more recently he moved into [place] prior home. He works in Engineer, technical sales.  His wife died from cancer some years ago.  The patient has no  grandchildren. She is a Tourist information centre manager but currently attends a [employer] with her friend Alicia Vasquez    ADVANCED DIRECTIVES: Not in place   HEALTH MAINTENANCE: Social History   Tobacco Use  . Smoking status: Never Smoker  . Smokeless tobacco: Never Used  Substance Use Topics  . Alcohol use: No    Alcohol/week: 0.0 standard drinks  . Drug use: No     Colonoscopy: Never  PAP:  Bone density: 09/20/2016 showed a T score of -2.2  Lipid panel:  Allergies  Allergen Reactions  . Anesthetics, Amide Hypertension  . Benadryl [Diphenhydramine Hcl] Other (See Comments)    Dizziness  . Carbocaine [Mepivacaine Hcl] Hypertension  . Codeine Other (See Comments)    Dizziness  . Epinephrine Hypertension  . Sulfa Antibiotics Other (See Comments)    dizziness  . Latex Other (See Comments)    Blisters in mouth  . Tramadol     Sedation.   Marland Kitchen Penicillins Rash    Has patient had a PCN reaction causing immediate rash, facial/tongue/throat swelling, SOB or lightheadedness with hypotension: Unknown Has patient had a PCN reaction causing severe rash involving mucus membranes or skin necrosis: Unknown Has patient had a PCN reaction  that required hospitalization: Unknown Has patient had a PCN reaction occurring within the last 10 years: Unknown If all of the above answers are "NO", then may proceed with Cephalosporin use.     Current Outpatient Medications  Medication Sig Dispense Refill  . celecoxib (CELEBREX) 200 MG capsule Take 1 capsule (200 mg total) by mouth 2 (two) times daily. 60 capsule 0  . Cholecalciferol (VITAMIN D3) 5000 UNITS TABS Take 5,000 Units by mouth daily.     Marland Kitchen HYDROcodone-acetaminophen (NORCO/VICODIN) 5-325 MG tablet Take 1 tablet by mouth every 6 (six) hours as needed for moderate pain. 120 tablet 0  . ketoconazole (NIZORAL) 2 % cream Apply 1 application topically daily. 15 g 0  . neomycin-polymyxin-hydrocortisone (CORTISPORIN) OTIC solution Apply 1-2 drops to toe after  soaking BID (Patient not taking: Reported on 09/12/2019) 10 mL 1  . SYNTHROID 125 MCG tablet TAKE 1 TABLET BY MOUTH EVERY DAY BEFORE BREAKFAST 23 tablet 2  . vitamin C (ASCORBIC ACID) 250 MG tablet Take 250 mg by mouth 3 (three) times daily.    Marland Kitchen warfarin (COUMADIN) 5 MG tablet TAKE 1.5-2 TABLETS ONE TIME ONLY AT 6 PM FOR 1 DOSE. TAKE 7.5 MG ALTERNATING WITH 10 MG DAILY 270 tablet 1   No current facility-administered medications for this visit.    OBJECTIVE: white woman examined in a wheelchair  Vitals:   10/02/19 1414  BP: (!) 151/64  Pulse: 86  Resp: 18  Temp: 99.1 F (37.3 C)  SpO2: 96%   Wt Readings from Last 3 Encounters:  10/02/19 227 lb 11.2 oz (103.3 kg)  06/04/19 228 lb (103.4 kg)  03/20/19 234 lb 4.8 oz (106.3 kg)   Body mass index is 39.08 kg/m.    ECOG FS:2 - Symptomatic, <50% confined to bed  Sclerae unicteric, EOMs intact Wearing a mask No cervical or supraclavicular adenopathy Lungs no rales or rhonchi Heart regular rate and rhythm Abd soft, nontender, positive bowel sounds MSK no focal spinal tenderness, no upper extremity lymphedema Neuro: nonfocal, well oriented, appropriate affect Breasts: The right breast is status post lumpectomy and radiation.  There is no evidence of local recurrence.  Left breast is benign.  Both axillae are benign.   LAB RESULTS:  CMP     Component Value Date/Time   NA 141 08/07/2019 1305   NA 141 05/16/2017 1416   K 4.4 08/07/2019 1305   K 3.8 05/16/2017 1416   CL 105 08/07/2019 1305   CO2 28 08/07/2019 1305   CO2 27 05/16/2017 1416   GLUCOSE 153 (H) 08/07/2019 1305   GLUCOSE 140 05/16/2017 1416   BUN 13 08/07/2019 1305   BUN 13.2 05/16/2017 1416   CREATININE 0.89 08/07/2019 1305   CREATININE 0.77 03/21/2018 1417   CREATININE 0.80 09/22/2017 1555   CREATININE 0.8 05/16/2017 1416   CALCIUM 10.9 (H) 08/07/2019 1305   CALCIUM 11.0 (H) 05/16/2017 1416   PROT 6.8 08/07/2019 1305   PROT 6.9 05/16/2017 1416   ALBUMIN  3.8 08/07/2019 1305   ALBUMIN 3.6 05/16/2017 1416   AST 13 (L) 08/07/2019 1305   AST 14 (L) 03/21/2018 1417   AST 13 05/16/2017 1416   ALT 14 08/07/2019 1305   ALT 14 03/21/2018 1417   ALT <6 05/16/2017 1416   ALKPHOS 157 (H) 08/07/2019 1305   ALKPHOS 130 05/16/2017 1416   BILITOT 0.3 08/07/2019 1305   BILITOT 0.3 03/21/2018 1417   BILITOT 0.31 05/16/2017 1416   GFRNONAA >60 08/07/2019 1305   GFRNONAA >60  03/21/2018 1417   GFRNONAA 75 08/19/2015 1602   GFRAA >60 08/07/2019 1305   GFRAA >60 03/21/2018 1417   GFRAA 87 08/19/2015 1602    I No results found for: SPEP  Lab Results  Component Value Date   WBC 8.7 10/02/2019   NEUTROABS 6.1 10/02/2019   HGB 13.6 10/02/2019   HCT 42.5 10/02/2019   MCV 90.8 10/02/2019   PLT 255 10/02/2019      Chemistry      Component Value Date/Time   NA 141 08/07/2019 1305   NA 141 05/16/2017 1416   K 4.4 08/07/2019 1305   K 3.8 05/16/2017 1416   CL 105 08/07/2019 1305   CO2 28 08/07/2019 1305   CO2 27 05/16/2017 1416   BUN 13 08/07/2019 1305   BUN 13.2 05/16/2017 1416   CREATININE 0.89 08/07/2019 1305   CREATININE 0.77 03/21/2018 1417   CREATININE 0.80 09/22/2017 1555   CREATININE 0.8 05/16/2017 1416      Component Value Date/Time   CALCIUM 10.9 (H) 08/07/2019 1305   CALCIUM 11.0 (H) 05/16/2017 1416   ALKPHOS 157 (H) 08/07/2019 1305   ALKPHOS 130 05/16/2017 1416   AST 13 (L) 08/07/2019 1305   AST 14 (L) 03/21/2018 1417   AST 13 05/16/2017 1416   ALT 14 08/07/2019 1305   ALT 14 03/21/2018 1417   ALT <6 05/16/2017 1416   BILITOT 0.3 08/07/2019 1305   BILITOT 0.3 03/21/2018 1417   BILITOT 0.31 05/16/2017 1416       No results found for: LABCA2  No components found for: LABCA125  Recent Labs  Lab 10/02/19 1336  INR 1.4*    Urinalysis    Component Value Date/Time   COLORURINE YELLOW 06/04/2019 1342   APPEARANCEUR CLOUDY (A) 06/04/2019 1342   LABSPEC 1.011 06/04/2019 1342   LABSPEC 1.010 09/22/2016 1553    PHURINE 7.0 06/04/2019 1342   GLUCOSEU NEGATIVE 06/04/2019 1342   GLUCOSEU Negative 09/22/2016 1553   HGBUR NEGATIVE 06/04/2019 1342   BILIRUBINUR NEGATIVE 06/04/2019 1342   BILIRUBINUR Negative 03/16/2018 1433   BILIRUBINUR Negative 09/22/2016 1553   KETONESUR NEGATIVE 06/04/2019 1342   PROTEINUR NEGATIVE 06/04/2019 1342   UROBILINOGEN 0.2 03/16/2018 1433   UROBILINOGEN 0.2 11/15/2016 1358   UROBILINOGEN 0.2 09/22/2016 1553   NITRITE NEGATIVE 06/04/2019 1342   LEUKOCYTESUR NEGATIVE 06/04/2019 1342   LEUKOCYTESUR Negative 09/22/2016 1553    STUDIES: No results found.   ASSESSMENT: 79 y.o. [place] woman status post right breast upper outer quadrant lumpectomy and sentinel lymph node sampling 09/09/2013 for an mpT1c pN1a, stage IIA invasive ductal carcinoma, estrogen and progesterone receptor both 100% positive with strong staining intensity, MIB-1 of 17% and no HER-2 amplification  (1) additional surgery for margin clearance 09/16/2013 obtained negative margins  (2) Oncotype DX recurrence score of 4 predicts a risk of outside the breast recurrence within 10 years of 7% if the patient's only systemic therapy is tamoxifen for 5 years. It also predicts no benefit from chemotherapy  (3) adjuvant radiation completed 01/07/2014  (4) anastrozole started 02/27/2014 stopped within 2 weeks because of arm swelling.   (a) bone density April 2016 showed osteopenia, with a t-score of -1.6  (b) anastrozole resumed 12/17/2015  (c) Bone density 09/20/2016 showed a T score of -2.2  (5) history of left lower extremity DVT 11/23/2012, initially on rivaroxaban, which caused chest pain, switch to Coumadin July 2014  (b) coumadin dose increased to 7.5/10 mg alternating days as of 06/13/2017   METASTATIC DISEASE:  August 2018 (6) status post left total hip replacement 01/16/2017 for estrogen receptor positive adenocarcinoma.  (a) CA 27-29 was 46.4 as of 04/18/2017  (b) chest CT scan 05/02/2017 shows  no lung or liver lesion concern; it does show aortic atherosclerosis  (c) baseline bone scan 05/02/2017 was negative  (d) PET scan 09/12/2017 shows no active disease, including bone  (e) PET scan and CT chest on 05/29/2018 show no active disease  (f) chest CT and bone scan 03/18/2019 showed no evidence of active disease  (7) fulvestrant started 04/18/2017  (8) denosumab/Xgeva started 05/16/2017  (a) changed to every 12-weeks after 10/03/2017 dose   (b) held starting with 06/13/2018 dose due to dental concerns  (9) unprovoked deep vein thrombosis involving the left posterior tibial v documented 11/23/2012, resolved on repeat 06/27/2013 and not recurrent on multiple Dopplers since, most recent 03/06/2018  (a) on chronic anticoagulation with warfarin given ongoing risks (stage IV breast cancer, relative immobility secondary to claudication   PLAN:  Myleah is now 2-1/2 years out from definitive diagnosis of metastatic breast cancer.  Her disease is very well controlled clinically and the plan is to continue fulvestrant indefinitely or until there is evidence of disease progression.  Her INR is subtherapeutic today because she has missed several doses.  Accordingly we are not adjusting the dose but simply urging her to take the medication as prescribed.  She has been reluctant to receive the coronavirus vaccine.  I encouraged her to go ahead and get herself vaccinated as she may indeed have a significant problem getting through the disease if she does become infected  She will see [name] again in 3 months and then again in 6 months.  She knows to call for any other issue that may develop before the next visit  Total encounter time 30 minutes.*  Teofil Maniaci, Alicia Dad, MD  10/02/19 2:21 PM Medical Oncology and Hematology [facility] [place], [place] [address] Tel. [phone]    Fax. [phone]   I, Wilburn Mylar, am acting as scribe for Dr. Virgie Vasquez. Hope Brandenburger.   I, Lurline Del MD, have reviewed the above documentation for accuracy and completeness, and I agree with the above.    *Total Encounter Time as defined by the Centers for Medicare and Medicaid Services includes, in addition to the face-to-face time of a patient visit (documented in the note above) non-face-to-face time: obtaining and reviewing outside history, ordering and reviewing medications, tests or procedures, care coordination (communications with other health care professionals or caregivers) and documentation in the medical record.

## 2019-10-02 ENCOUNTER — Inpatient Hospital Stay (HOSPITAL_BASED_OUTPATIENT_CLINIC_OR_DEPARTMENT_OTHER): Payer: Medicare Other | Admitting: Oncology

## 2019-10-02 ENCOUNTER — Other Ambulatory Visit: Payer: Self-pay

## 2019-10-02 ENCOUNTER — Inpatient Hospital Stay: Payer: Medicare Other

## 2019-10-02 ENCOUNTER — Inpatient Hospital Stay: Payer: Medicare Other | Attending: Oncology

## 2019-10-02 VITALS — BP 151/64 | HR 86 | Temp 99.1°F | Resp 18 | Ht 64.0 in | Wt 227.7 lb

## 2019-10-02 DIAGNOSIS — M84659P Pathological fracture in other disease, hip, unspecified, subsequent encounter for fracture with malunion: Secondary | ICD-10-CM

## 2019-10-02 DIAGNOSIS — Z170 Estrogen receptor positive status [ER+]: Secondary | ICD-10-CM | POA: Insufficient documentation

## 2019-10-02 DIAGNOSIS — D6859 Other primary thrombophilia: Secondary | ICD-10-CM

## 2019-10-02 DIAGNOSIS — Z923 Personal history of irradiation: Secondary | ICD-10-CM | POA: Diagnosis not present

## 2019-10-02 DIAGNOSIS — M8580 Other specified disorders of bone density and structure, unspecified site: Secondary | ICD-10-CM | POA: Insufficient documentation

## 2019-10-02 DIAGNOSIS — Z8249 Family history of ischemic heart disease and other diseases of the circulatory system: Secondary | ICD-10-CM | POA: Insufficient documentation

## 2019-10-02 DIAGNOSIS — Z79899 Other long term (current) drug therapy: Secondary | ICD-10-CM | POA: Diagnosis not present

## 2019-10-02 DIAGNOSIS — I10 Essential (primary) hypertension: Secondary | ICD-10-CM | POA: Diagnosis not present

## 2019-10-02 DIAGNOSIS — Z8042 Family history of malignant neoplasm of prostate: Secondary | ICD-10-CM | POA: Diagnosis not present

## 2019-10-02 DIAGNOSIS — I700 Atherosclerosis of aorta: Secondary | ICD-10-CM

## 2019-10-02 DIAGNOSIS — Z5111 Encounter for antineoplastic chemotherapy: Secondary | ICD-10-CM | POA: Insufficient documentation

## 2019-10-02 DIAGNOSIS — Z800 Family history of malignant neoplasm of digestive organs: Secondary | ICD-10-CM | POA: Diagnosis not present

## 2019-10-02 DIAGNOSIS — Z7901 Long term (current) use of anticoagulants: Secondary | ICD-10-CM | POA: Insufficient documentation

## 2019-10-02 DIAGNOSIS — C50411 Malignant neoplasm of upper-outer quadrant of right female breast: Secondary | ICD-10-CM

## 2019-10-02 DIAGNOSIS — C7951 Secondary malignant neoplasm of bone: Secondary | ICD-10-CM | POA: Diagnosis not present

## 2019-10-02 DIAGNOSIS — Z86718 Personal history of other venous thrombosis and embolism: Secondary | ICD-10-CM | POA: Diagnosis not present

## 2019-10-02 DIAGNOSIS — C773 Secondary and unspecified malignant neoplasm of axilla and upper limb lymph nodes: Secondary | ICD-10-CM | POA: Insufficient documentation

## 2019-10-02 DIAGNOSIS — E039 Hypothyroidism, unspecified: Secondary | ICD-10-CM | POA: Insufficient documentation

## 2019-10-02 DIAGNOSIS — M899 Disorder of bone, unspecified: Secondary | ICD-10-CM

## 2019-10-02 DIAGNOSIS — Z791 Long term (current) use of non-steroidal anti-inflammatories (NSAID): Secondary | ICD-10-CM | POA: Diagnosis not present

## 2019-10-02 DIAGNOSIS — M84459A Pathological fracture, hip, unspecified, initial encounter for fracture: Secondary | ICD-10-CM

## 2019-10-02 DIAGNOSIS — I82401 Acute embolism and thrombosis of unspecified deep veins of right lower extremity: Secondary | ICD-10-CM

## 2019-10-02 LAB — PROTIME-INR
INR: 1.4 — ABNORMAL HIGH (ref 0.8–1.2)
Prothrombin Time: 16.4 seconds — ABNORMAL HIGH (ref 11.4–15.2)

## 2019-10-02 LAB — CBC WITH DIFFERENTIAL/PLATELET
Abs Immature Granulocytes: 0.03 10*3/uL (ref 0.00–0.07)
Basophils Absolute: 0.1 10*3/uL (ref 0.0–0.1)
Basophils Relative: 1 %
Eosinophils Absolute: 0.3 10*3/uL (ref 0.0–0.5)
Eosinophils Relative: 3 %
HCT: 42.5 % (ref 36.0–46.0)
Hemoglobin: 13.6 g/dL (ref 12.0–15.0)
Immature Granulocytes: 0 %
Lymphocytes Relative: 13 %
Lymphs Abs: 1.2 10*3/uL (ref 0.7–4.0)
MCH: 29.1 pg (ref 26.0–34.0)
MCHC: 32 g/dL (ref 30.0–36.0)
MCV: 90.8 fL (ref 80.0–100.0)
Monocytes Absolute: 1.2 10*3/uL — ABNORMAL HIGH (ref 0.1–1.0)
Monocytes Relative: 13 %
Neutro Abs: 6.1 10*3/uL (ref 1.7–7.7)
Neutrophils Relative %: 70 %
Platelets: 255 10*3/uL (ref 150–400)
RBC: 4.68 MIL/uL (ref 3.87–5.11)
RDW: 15.2 % (ref 11.5–15.5)
WBC: 8.7 10*3/uL (ref 4.0–10.5)
nRBC: 0 % (ref 0.0–0.2)

## 2019-10-02 MED ORDER — FULVESTRANT 250 MG/5ML IM SOLN
INTRAMUSCULAR | Status: AC
  Filled 2019-10-02: qty 10

## 2019-10-02 MED ORDER — FULVESTRANT 250 MG/5ML IM SOLN
500.0000 mg | Freq: Once | INTRAMUSCULAR | Status: AC
  Administered 2019-10-02: 15:00:00 500 mg via INTRAMUSCULAR

## 2019-10-02 NOTE — Patient Instructions (Signed)
Fulvestrant injection What is this medicine? FULVESTRANT (ful VES trant) blocks the effects of estrogen. It is used to treat breast cancer. This medicine may be used for other purposes; ask your health care provider or pharmacist if you have questions. COMMON BRAND NAME(S): FASLODEX What should I tell my health care provider before I take this medicine? They need to know if you have any of these conditions:  bleeding disorders  liver disease  low blood counts, like low white cell, platelet, or red cell counts  an unusual or allergic reaction to fulvestrant, other medicines, foods, dyes, or preservatives  pregnant or trying to get pregnant  breast-feeding How should I use this medicine? This medicine is for injection into a muscle. It is usually given by a health care professional in a hospital or clinic setting. Talk to your pediatrician regarding the use of this medicine in children. Special care may be needed. Overdosage: If you think you have taken too much of this medicine contact a poison control center or emergency room at once. NOTE: This medicine is only for you. Do not share this medicine with others. What if I miss a dose? It is important not to miss your dose. Call your doctor or health care professional if you are unable to keep an appointment. What may interact with this medicine?  medicines that treat or prevent blood clots like warfarin, enoxaparin, dalteparin, apixaban, dabigatran, and rivaroxaban This list may not describe all possible interactions. Give your health care provider a list of all the medicines, herbs, non-prescription drugs, or dietary supplements you use. Also tell them if you smoke, drink alcohol, or use illegal drugs. Some items may interact with your medicine. What should I watch for while using this medicine? Your condition will be monitored carefully while you are receiving this medicine. You will need important blood work done while you are taking  this medicine. Do not become pregnant while taking this medicine or for at least 1 year after stopping it. Women of child-bearing potential will need to have a negative pregnancy test before starting this medicine. Women should inform their doctor if they wish to become pregnant or think they might be pregnant. There is a potential for serious side effects to an unborn child. Men should inform their doctors if they wish to father a child. This medicine may lower sperm counts. Talk to your health care professional or pharmacist for more information. Do not breast-feed an infant while taking this medicine or for 1 year after the last dose. What side effects may I notice from receiving this medicine? Side effects that you should report to your doctor or health care professional as soon as possible:  allergic reactions like skin rash, itching or hives, swelling of the face, lips, or tongue  feeling faint or lightheaded, falls  pain, tingling, numbness, or weakness in the legs  signs and symptoms of infection like fever or chills; cough; flu-like symptoms; sore throat  vaginal bleeding Side effects that usually do not require medical attention (report to your doctor or health care professional if they continue or are bothersome):  aches, pains  constipation  diarrhea  headache  hot flashes  nausea, vomiting  pain at site where injected  stomach pain This list may not describe all possible side effects. Call your doctor for medical advice about side effects. You may report side effects to FDA at 1-800-FDA-1088. Where should I keep my medicine? This drug is given in a hospital or clinic and will   not be stored at home. NOTE: This sheet is a summary. It may not cover all possible information. If you have questions about this medicine, talk to your doctor, pharmacist, or health care provider.  2020 Elsevier/Gold Standard (2017-08-24 11:34:41)  

## 2019-10-03 ENCOUNTER — Telehealth: Payer: Self-pay | Admitting: Oncology

## 2019-10-03 ENCOUNTER — Other Ambulatory Visit: Payer: Self-pay | Admitting: Oncology

## 2019-10-03 DIAGNOSIS — E039 Hypothyroidism, unspecified: Secondary | ICD-10-CM

## 2019-10-03 LAB — CANCER ANTIGEN 27.29: CA 27.29: 43.1 U/mL — ABNORMAL HIGH (ref 0.0–38.6)

## 2019-10-03 NOTE — Telephone Encounter (Signed)
Scheduled appts per 5/5 los. Pt confirmed appt date and time. Sent msg to HIM to mail appt calendar per pt's request.

## 2019-10-22 ENCOUNTER — Other Ambulatory Visit: Payer: Self-pay | Admitting: *Deleted

## 2019-10-24 ENCOUNTER — Other Ambulatory Visit: Payer: Self-pay | Admitting: Oncology

## 2019-10-24 DIAGNOSIS — M84559P Pathological fracture in neoplastic disease, hip, unspecified, subsequent encounter for fracture with malunion: Secondary | ICD-10-CM

## 2019-10-24 MED ORDER — HYDROCODONE-ACETAMINOPHEN 5-325 MG PO TABS: 1.0000 | ORAL_TABLET | Freq: Four times a day (QID) | ORAL | 0 refills | Status: DC | PRN

## 2019-10-30 ENCOUNTER — Other Ambulatory Visit: Payer: Self-pay

## 2019-10-30 ENCOUNTER — Inpatient Hospital Stay: Payer: Medicare Other

## 2019-10-30 ENCOUNTER — Inpatient Hospital Stay: Payer: Medicare Other | Attending: Oncology

## 2019-10-30 VITALS — BP 151/72 | HR 78 | Temp 98.2°F | Resp 18

## 2019-10-30 DIAGNOSIS — M8580 Other specified disorders of bone density and structure, unspecified site: Secondary | ICD-10-CM

## 2019-10-30 DIAGNOSIS — C773 Secondary and unspecified malignant neoplasm of axilla and upper limb lymph nodes: Secondary | ICD-10-CM | POA: Diagnosis not present

## 2019-10-30 DIAGNOSIS — C50411 Malignant neoplasm of upper-outer quadrant of right female breast: Secondary | ICD-10-CM | POA: Insufficient documentation

## 2019-10-30 DIAGNOSIS — I700 Atherosclerosis of aorta: Secondary | ICD-10-CM

## 2019-10-30 DIAGNOSIS — Z170 Estrogen receptor positive status [ER+]: Secondary | ICD-10-CM

## 2019-10-30 DIAGNOSIS — M84459A Pathological fracture, hip, unspecified, initial encounter for fracture: Secondary | ICD-10-CM

## 2019-10-30 DIAGNOSIS — M84659P Pathological fracture in other disease, hip, unspecified, subsequent encounter for fracture with malunion: Secondary | ICD-10-CM

## 2019-10-30 DIAGNOSIS — Z86718 Personal history of other venous thrombosis and embolism: Secondary | ICD-10-CM | POA: Diagnosis not present

## 2019-10-30 DIAGNOSIS — C7951 Secondary malignant neoplasm of bone: Secondary | ICD-10-CM

## 2019-10-30 DIAGNOSIS — I82401 Acute embolism and thrombosis of unspecified deep veins of right lower extremity: Secondary | ICD-10-CM

## 2019-10-30 DIAGNOSIS — Z7901 Long term (current) use of anticoagulants: Secondary | ICD-10-CM | POA: Insufficient documentation

## 2019-10-30 DIAGNOSIS — D6859 Other primary thrombophilia: Secondary | ICD-10-CM

## 2019-10-30 DIAGNOSIS — M899 Disorder of bone, unspecified: Secondary | ICD-10-CM

## 2019-10-30 DIAGNOSIS — Z79818 Long term (current) use of other agents affecting estrogen receptors and estrogen levels: Secondary | ICD-10-CM | POA: Insufficient documentation

## 2019-10-30 DIAGNOSIS — M898X5 Other specified disorders of bone, thigh: Secondary | ICD-10-CM

## 2019-10-30 LAB — CBC WITH DIFFERENTIAL/PLATELET
Abs Immature Granulocytes: 0.02 10*3/uL (ref 0.00–0.07)
Basophils Absolute: 0.1 10*3/uL (ref 0.0–0.1)
Basophils Relative: 1 %
Eosinophils Absolute: 0.3 10*3/uL (ref 0.0–0.5)
Eosinophils Relative: 4 %
HCT: 41.3 % (ref 36.0–46.0)
Hemoglobin: 13.3 g/dL (ref 12.0–15.0)
Immature Granulocytes: 0 %
Lymphocytes Relative: 12 %
Lymphs Abs: 1.1 10*3/uL (ref 0.7–4.0)
MCH: 29 pg (ref 26.0–34.0)
MCHC: 32.2 g/dL (ref 30.0–36.0)
MCV: 90.2 fL (ref 80.0–100.0)
Monocytes Absolute: 1.2 10*3/uL — ABNORMAL HIGH (ref 0.1–1.0)
Monocytes Relative: 13 %
Neutro Abs: 6.3 10*3/uL (ref 1.7–7.7)
Neutrophils Relative %: 70 %
Platelets: 249 10*3/uL (ref 150–400)
RBC: 4.58 MIL/uL (ref 3.87–5.11)
RDW: 14.7 % (ref 11.5–15.5)
WBC: 8.9 10*3/uL (ref 4.0–10.5)
nRBC: 0 % (ref 0.0–0.2)

## 2019-10-30 LAB — PROTIME-INR
INR: 1.6 — ABNORMAL HIGH (ref 0.8–1.2)
Prothrombin Time: 18.2 seconds — ABNORMAL HIGH (ref 11.4–15.2)

## 2019-10-30 MED ORDER — FULVESTRANT 250 MG/5ML IM SOLN
INTRAMUSCULAR | Status: AC
  Filled 2019-10-30: qty 10

## 2019-10-30 MED ORDER — FULVESTRANT 250 MG/5ML IM SOLN
500.0000 mg | Freq: Once | INTRAMUSCULAR | Status: AC
  Administered 2019-10-30: 500 mg via INTRAMUSCULAR

## 2019-10-30 NOTE — Patient Instructions (Signed)
Fulvestrant injection What is this medicine? FULVESTRANT (ful VES trant) blocks the effects of estrogen. It is used to treat breast cancer. This medicine may be used for other purposes; ask your health care provider or pharmacist if you have questions. COMMON BRAND NAME(S): FASLODEX What should I tell my health care provider before I take this medicine? They need to know if you have any of these conditions:  bleeding disorders  liver disease  low blood counts, like low white cell, platelet, or red cell counts  an unusual or allergic reaction to fulvestrant, other medicines, foods, dyes, or preservatives  pregnant or trying to get pregnant  breast-feeding How should I use this medicine? This medicine is for injection into a muscle. It is usually given by a health care professional in a hospital or clinic setting. Talk to your pediatrician regarding the use of this medicine in children. Special care may be needed. Overdosage: If you think you have taken too much of this medicine contact a poison control center or emergency room at once. NOTE: This medicine is only for you. Do not share this medicine with others. What if I miss a dose? It is important not to miss your dose. Call your doctor or health care professional if you are unable to keep an appointment. What may interact with this medicine?  medicines that treat or prevent blood clots like warfarin, enoxaparin, dalteparin, apixaban, dabigatran, and rivaroxaban This list may not describe all possible interactions. Give your health care provider a list of all the medicines, herbs, non-prescription drugs, or dietary supplements you use. Also tell them if you smoke, drink alcohol, or use illegal drugs. Some items may interact with your medicine. What should I watch for while using this medicine? Your condition will be monitored carefully while you are receiving this medicine. You will need important blood work done while you are taking  this medicine. Do not become pregnant while taking this medicine or for at least 1 year after stopping it. Women of child-bearing potential will need to have a negative pregnancy test before starting this medicine. Women should inform their doctor if they wish to become pregnant or think they might be pregnant. There is a potential for serious side effects to an unborn child. Men should inform their doctors if they wish to father a child. This medicine may lower sperm counts. Talk to your health care professional or pharmacist for more information. Do not breast-feed an infant while taking this medicine or for 1 year after the last dose. What side effects may I notice from receiving this medicine? Side effects that you should report to your doctor or health care professional as soon as possible:  allergic reactions like skin rash, itching or hives, swelling of the face, lips, or tongue  feeling faint or lightheaded, falls  pain, tingling, numbness, or weakness in the legs  signs and symptoms of infection like fever or chills; cough; flu-like symptoms; sore throat  vaginal bleeding Side effects that usually do not require medical attention (report to your doctor or health care professional if they continue or are bothersome):  aches, pains  constipation  diarrhea  headache  hot flashes  nausea, vomiting  pain at site where injected  stomach pain This list may not describe all possible side effects. Call your doctor for medical advice about side effects. You may report side effects to FDA at 1-800-FDA-1088. Where should I keep my medicine? This drug is given in a hospital or clinic and will   not be stored at home. NOTE: This sheet is a summary. It may not cover all possible information. If you have questions about this medicine, talk to your doctor, pharmacist, or health care provider.  2020 Elsevier/Gold Standard (2017-08-24 11:34:41)  

## 2019-10-31 LAB — CANCER ANTIGEN 27.29: CA 27.29: 41.7 U/mL — ABNORMAL HIGH (ref 0.0–38.6)

## 2019-11-27 ENCOUNTER — Inpatient Hospital Stay: Payer: Medicare Other

## 2019-11-27 ENCOUNTER — Other Ambulatory Visit: Payer: Self-pay

## 2019-11-27 ENCOUNTER — Other Ambulatory Visit: Payer: Self-pay | Admitting: Adult Health

## 2019-11-27 VITALS — BP 155/71 | HR 85 | Temp 98.2°F | Resp 18

## 2019-11-27 DIAGNOSIS — C50411 Malignant neoplasm of upper-outer quadrant of right female breast: Secondary | ICD-10-CM

## 2019-11-27 DIAGNOSIS — M84459A Pathological fracture, hip, unspecified, initial encounter for fracture: Secondary | ICD-10-CM

## 2019-11-27 DIAGNOSIS — M8580 Other specified disorders of bone density and structure, unspecified site: Secondary | ICD-10-CM

## 2019-11-27 DIAGNOSIS — M898X5 Other specified disorders of bone, thigh: Secondary | ICD-10-CM

## 2019-11-27 DIAGNOSIS — D6859 Other primary thrombophilia: Secondary | ICD-10-CM

## 2019-11-27 DIAGNOSIS — M899 Disorder of bone, unspecified: Secondary | ICD-10-CM

## 2019-11-27 DIAGNOSIS — Z170 Estrogen receptor positive status [ER+]: Secondary | ICD-10-CM

## 2019-11-27 DIAGNOSIS — I82401 Acute embolism and thrombosis of unspecified deep veins of right lower extremity: Secondary | ICD-10-CM

## 2019-11-27 DIAGNOSIS — M84659P Pathological fracture in other disease, hip, unspecified, subsequent encounter for fracture with malunion: Secondary | ICD-10-CM

## 2019-11-27 DIAGNOSIS — C7951 Secondary malignant neoplasm of bone: Secondary | ICD-10-CM

## 2019-11-27 DIAGNOSIS — M84559P Pathological fracture in neoplastic disease, hip, unspecified, subsequent encounter for fracture with malunion: Secondary | ICD-10-CM

## 2019-11-27 DIAGNOSIS — G893 Neoplasm related pain (acute) (chronic): Secondary | ICD-10-CM

## 2019-11-27 DIAGNOSIS — I700 Atherosclerosis of aorta: Secondary | ICD-10-CM

## 2019-11-27 LAB — CBC WITH DIFFERENTIAL/PLATELET
Abs Immature Granulocytes: 0.03 10*3/uL (ref 0.00–0.07)
Basophils Absolute: 0.1 10*3/uL (ref 0.0–0.1)
Basophils Relative: 1 %
Eosinophils Absolute: 0.4 10*3/uL (ref 0.0–0.5)
Eosinophils Relative: 5 %
HCT: 42.1 % (ref 36.0–46.0)
Hemoglobin: 13.5 g/dL (ref 12.0–15.0)
Immature Granulocytes: 0 %
Lymphocytes Relative: 15 %
Lymphs Abs: 1.2 10*3/uL (ref 0.7–4.0)
MCH: 29.2 pg (ref 26.0–34.0)
MCHC: 32.1 g/dL (ref 30.0–36.0)
MCV: 90.9 fL (ref 80.0–100.0)
Monocytes Absolute: 1.1 10*3/uL — ABNORMAL HIGH (ref 0.1–1.0)
Monocytes Relative: 13 %
Neutro Abs: 5.3 10*3/uL (ref 1.7–7.7)
Neutrophils Relative %: 66 %
Platelets: 246 10*3/uL (ref 150–400)
RBC: 4.63 MIL/uL (ref 3.87–5.11)
RDW: 14.6 % (ref 11.5–15.5)
WBC: 8 10*3/uL (ref 4.0–10.5)
nRBC: 0 % (ref 0.0–0.2)

## 2019-11-27 LAB — PROTIME-INR
INR: 2.4 — ABNORMAL HIGH (ref 0.8–1.2)
Prothrombin Time: 25 seconds — ABNORMAL HIGH (ref 11.4–15.2)

## 2019-11-27 MED ORDER — FULVESTRANT 250 MG/5ML IM SOLN
500.0000 mg | Freq: Once | INTRAMUSCULAR | Status: AC
  Administered 2019-11-27: 500 mg via INTRAMUSCULAR

## 2019-11-27 MED ORDER — FULVESTRANT 250 MG/5ML IM SOLN
INTRAMUSCULAR | Status: AC
  Filled 2019-11-27: qty 5

## 2019-11-27 MED ORDER — HYDROCODONE-ACETAMINOPHEN 5-325 MG PO TABS: 1.0000 | ORAL_TABLET | Freq: Four times a day (QID) | ORAL | 0 refills | Status: DC | PRN

## 2019-11-27 NOTE — Progress Notes (Signed)
Patient requesting refill on Norco per current pain management plan for her metastatic cancer pain.  She tolerates this well, and goals of pain care include increased functionality.  She does not have any increased somnolence with it.    I reviewed PMP aware. No red flags noted.  Patient prescribed Norco #120 per her oncologist pain management treatment plan.    Wilber Bihari, NP

## 2019-11-28 LAB — CANCER ANTIGEN 27.29: CA 27.29: 49.7 U/mL — ABNORMAL HIGH (ref 0.0–38.6)

## 2019-12-11 ENCOUNTER — Telehealth: Payer: Self-pay | Admitting: *Deleted

## 2019-12-11 NOTE — Telephone Encounter (Signed)
Pt reports another broken tooth and another filling has fallen out. She was sent to Dr Enrique Sack, he referred her to an oral surgeon- she never went to the appt. RN suggested she call Dr Lupita Leash office for an appt

## 2019-12-13 ENCOUNTER — Ambulatory Visit (INDEPENDENT_AMBULATORY_CARE_PROVIDER_SITE_OTHER): Payer: Medicare Other | Admitting: Podiatry

## 2019-12-13 ENCOUNTER — Encounter: Payer: Self-pay | Admitting: Podiatry

## 2019-12-13 ENCOUNTER — Other Ambulatory Visit: Payer: Self-pay

## 2019-12-13 DIAGNOSIS — B351 Tinea unguium: Secondary | ICD-10-CM | POA: Diagnosis not present

## 2019-12-13 DIAGNOSIS — M79675 Pain in left toe(s): Secondary | ICD-10-CM

## 2019-12-13 DIAGNOSIS — D689 Coagulation defect, unspecified: Secondary | ICD-10-CM | POA: Diagnosis not present

## 2019-12-13 DIAGNOSIS — M79674 Pain in right toe(s): Secondary | ICD-10-CM | POA: Diagnosis not present

## 2019-12-15 NOTE — Progress Notes (Signed)
Subjective:  Patient ID: Alicia Vasquez [facility], female    DOB: [birth year],  MRN: [id number]  [place] presents to clinic today for at risk care with h/o coagulation defect. Patient is on long term blood thinner Coumadin..  79 y.o. female presents with the above complaint.  Reports painfully elongated nails to both feet.  Review of Systems: Negative except as noted in the HPI. Past Medical History:  Diagnosis Date  . Allergy   . Anxiety   . Arthritis   . Blood transfusion without reported diagnosis   . Breast cancer ([place]) 07/12/2013   Invasive Mammary Carcinoma  . DVT (deep vein thrombosis) in pregnancy   . Hypertension   . Hypothyroid   . Metastatic cancer to bone ([place]) dx'd 12/2016   hip  . Personal history of radiation therapy   . Pneumonia   . Radiation 11/21/13-01/07/14   Right Breast/Supraclavicular   Past Surgical History:  Procedure Laterality Date  . BREAST LUMPECTOMY Left 2015  . BREAST LUMPECTOMY WITH RADIOACTIVE SEED LOCALIZATION Right 09/09/2013   Procedure: BREAST LUMPECTOMY WITH RADIOACTIVE SEED LOCALIZATION WITH AXILLARY NODE EXCISION;  Surgeon: [name], MD;  Location: [place];  Service: General;  Laterality: Right;  . DENTAL SURGERY  04/19/2012   13 TEETH REMOVED  . DILATION AND CURETTAGE OF UTERUS    . ORIF PERIPROSTHETIC FRACTURE Left 01/31/2017   Procedure: REVISION and OPEN REDUCTION INTERNAL FIXATION (ORIF) PERIPROSTHETIC FRACTURE LEFT HIP;  Surgeon: Paralee Cancel, MD;  [facility];  Service: Orthopedics;  Laterality: Left;  120 mins  . RE-EXCISION OF BREAST LUMPECTOMY Right 09/24/2013   Procedure: RE-EXCISION OF RIGHT BREAST LUMPECTOMY;  Surgeon: [name], MD;  Location: [place];  Service: General;  Laterality: Right;  . TOTAL HIP ARTHROPLASTY Left 01/16/2017   Procedure: TOTAL HIP ARTHROPLASTY POSTERIOR;  Surgeon: Paralee Cancel, MD;  [facility];  Service: Orthopedics;  Laterality: Left;      Current Outpatient Medications:  .  Cholecalciferol (VITAMIN D3) 5000 UNITS TABS, Take 5,000 Units by mouth daily. , Disp: , Rfl:  .  HYDROcodone-acetaminophen (NORCO/VICODIN) 5-325 MG tablet, Take 1 tablet by mouth every 6 (six) hours as needed for moderate pain., Disp: 120 tablet, Rfl: 0 .  ketoconazole (NIZORAL) 2 % cream, Apply 1 application topically daily., Disp: 15 g, Rfl: 0 .  SYNTHROID 125 MCG tablet, TAKE 1 TABLET BY MOUTH EVERY DAY BEFORE BREAKFAST, Disp: 30 tablet, Rfl: 2 .  vitamin C (ASCORBIC ACID) 250 MG tablet, Take 250 mg by mouth 3 (three) times daily., Disp: , Rfl:  .  warfarin (COUMADIN) 5 MG tablet, TAKE 1.5-2 TABLETS ONE TIME ONLY AT 6 PM FOR 1 DOSE. TAKE 7.5 MG ALTERNATING WITH 10 MG DAILY, Disp: 270 tablet, Rfl: 1 Allergies  Allergen Reactions  . Anesthetics, Amide Hypertension  . Benadryl [Diphenhydramine Hcl] Other (See Comments)    Dizziness  . Carbocaine [Mepivacaine Hcl] Hypertension  . Codeine Other (See Comments)    Dizziness  . Epinephrine Hypertension  . Sulfa Antibiotics Other (See Comments)    dizziness  . Latex Other (See Comments)    Blisters in mouth  . Tramadol     Sedation.   Marland Kitchen Penicillins Rash    Has patient had a PCN reaction causing immediate rash, facial/tongue/throat swelling, SOB or lightheadedness with hypotension: Unknown Has patient had a PCN reaction causing severe rash involving mucus membranes or skin necrosis: Unknown Has patient had a PCN reaction that required hospitalization: Unknown Has patient  had a PCN reaction occurring within the last 10 years: Unknown If all of the above answers are "NO", then may proceed with Cephalosporin use.    Social History   Occupational History  . Not on file  Tobacco Use  . Smoking status: Never Smoker  . Smokeless tobacco: Never Used  Substance and Sexual Activity  . Alcohol use: No    Alcohol/week: 0.0 standard drinks  . Drug use: No  . Sexual activity: Never    Objective:    Constitutional Genevra Orne Argote is a pleasant 79 y.o. Caucasian female, morbidly obese in NAD.Marland Kitchen AAO x 3.   Vascular  No cyanosis or clubbing noted. Pedal hair growth diminished b/l. Capillary fill time to digits <3 seconds b/l lower extremities. Faintly palpable pedal pulses b/l. Pedal hair present. Lower extremity skin temperature gradient within normal limits.  Neurologic Normal speech. Oriented to person, place, and time. Epicritic sensation to light touch grossly present bilaterally. Protective sensation diminished with 10g monofilament b/l. Vibratory sensation intact b/l. Proprioception intact bilaterally.  Dermatologic  Pedal skin with normal turgor, texture and tone bilaterally. No open wounds bilaterally. No interdigital macerations bilaterally. Toenails 1-5 b/l elongated, discolored, dystrophic, thickened, crumbly with subungual debris and tenderness to dorsal palpation.  Orthopedic: Normal muscle strength 5/5 to all lower extremity muscle groups bilaterally. No pain crepitus or joint limitation noted with ROM b/l. No bony tenderness.    Radiographs: None Assessment:   1. Pain due to onychomycosis of toenails of both feet   2. Blood clotting disorder ([place])    Plan:  Patient was evaluated and treated and all questions answered.  Onychomycosis with pain -Nails palliatively debridement as below -Educated on self-care  Procedure: Nail Debridement Rationale: Pain Type of Debridement: manual, sharp debridement. Instrumentation: Nail nipper, rotary burr. Number of Nails: 10 -Examined patient. -No new findings. No new orders. -Toenails 1-5 b/l were debrided in length and girth with sterile nail nippers and dremel without iatrogenic bleeding.  -Patient to report any pedal injuries to medical professional immediately. -Patient to continue soft, supportive shoe gear daily. -Patient/POA to call should there be question/concern in the interim.  Return in about 3 months  (around 03/14/2020).  Marzetta Board, DPM

## 2019-12-25 ENCOUNTER — Other Ambulatory Visit: Payer: Self-pay

## 2019-12-25 ENCOUNTER — Inpatient Hospital Stay: Payer: Medicare Other

## 2019-12-25 ENCOUNTER — Inpatient Hospital Stay: Payer: Medicare Other | Attending: Oncology

## 2019-12-25 VITALS — BP 158/68 | HR 80 | Resp 18

## 2019-12-25 DIAGNOSIS — Z170 Estrogen receptor positive status [ER+]: Secondary | ICD-10-CM

## 2019-12-25 DIAGNOSIS — M84659P Pathological fracture in other disease, hip, unspecified, subsequent encounter for fracture with malunion: Secondary | ICD-10-CM

## 2019-12-25 DIAGNOSIS — Z5111 Encounter for antineoplastic chemotherapy: Secondary | ICD-10-CM | POA: Diagnosis not present

## 2019-12-25 DIAGNOSIS — C50411 Malignant neoplasm of upper-outer quadrant of right female breast: Secondary | ICD-10-CM | POA: Diagnosis not present

## 2019-12-25 DIAGNOSIS — M899 Disorder of bone, unspecified: Secondary | ICD-10-CM

## 2019-12-25 DIAGNOSIS — D6859 Other primary thrombophilia: Secondary | ICD-10-CM

## 2019-12-25 DIAGNOSIS — M898X5 Other specified disorders of bone, thigh: Secondary | ICD-10-CM

## 2019-12-25 DIAGNOSIS — M84459A Pathological fracture, hip, unspecified, initial encounter for fracture: Secondary | ICD-10-CM

## 2019-12-25 DIAGNOSIS — Z7901 Long term (current) use of anticoagulants: Secondary | ICD-10-CM | POA: Insufficient documentation

## 2019-12-25 DIAGNOSIS — M8580 Other specified disorders of bone density and structure, unspecified site: Secondary | ICD-10-CM

## 2019-12-25 DIAGNOSIS — Z86718 Personal history of other venous thrombosis and embolism: Secondary | ICD-10-CM | POA: Insufficient documentation

## 2019-12-25 DIAGNOSIS — C7951 Secondary malignant neoplasm of bone: Secondary | ICD-10-CM

## 2019-12-25 DIAGNOSIS — I700 Atherosclerosis of aorta: Secondary | ICD-10-CM

## 2019-12-25 DIAGNOSIS — I82401 Acute embolism and thrombosis of unspecified deep veins of right lower extremity: Secondary | ICD-10-CM

## 2019-12-25 LAB — CBC WITH DIFFERENTIAL/PLATELET
Abs Immature Granulocytes: 0.02 10*3/uL (ref 0.00–0.07)
Basophils Absolute: 0.1 10*3/uL (ref 0.0–0.1)
Basophils Relative: 1 %
Eosinophils Absolute: 0.4 10*3/uL (ref 0.0–0.5)
Eosinophils Relative: 6 %
HCT: 41.7 % (ref 36.0–46.0)
Hemoglobin: 13.2 g/dL (ref 12.0–15.0)
Immature Granulocytes: 0 %
Lymphocytes Relative: 17 %
Lymphs Abs: 1.2 10*3/uL (ref 0.7–4.0)
MCH: 28.9 pg (ref 26.0–34.0)
MCHC: 31.7 g/dL (ref 30.0–36.0)
MCV: 91.2 fL (ref 80.0–100.0)
Monocytes Absolute: 0.9 10*3/uL (ref 0.1–1.0)
Monocytes Relative: 13 %
Neutro Abs: 4.4 10*3/uL (ref 1.7–7.7)
Neutrophils Relative %: 63 %
Platelets: 233 10*3/uL (ref 150–400)
RBC: 4.57 MIL/uL (ref 3.87–5.11)
RDW: 14.7 % (ref 11.5–15.5)
WBC: 6.9 10*3/uL (ref 4.0–10.5)
nRBC: 0 % (ref 0.0–0.2)

## 2019-12-25 LAB — PROTIME-INR
INR: 2.2 — ABNORMAL HIGH (ref 0.8–1.2)
Prothrombin Time: 23.3 seconds — ABNORMAL HIGH (ref 11.4–15.2)

## 2019-12-25 MED ORDER — DENOSUMAB 120 MG/1.7ML ~~LOC~~ SOLN
SUBCUTANEOUS | Status: AC
  Filled 2019-12-25: qty 1.7

## 2019-12-25 MED ORDER — FULVESTRANT 250 MG/5ML IM SOLN
INTRAMUSCULAR | Status: AC
  Filled 2019-12-25: qty 10

## 2019-12-25 MED ORDER — FULVESTRANT 250 MG/5ML IM SOLN
500.0000 mg | Freq: Once | INTRAMUSCULAR | Status: AC
  Administered 2019-12-25: 500 mg via INTRAMUSCULAR

## 2019-12-25 NOTE — Patient Instructions (Signed)
Fulvestrant injection What is this medicine? FULVESTRANT (ful VES trant) blocks the effects of estrogen. It is used to treat breast cancer. This medicine may be used for other purposes; ask your health care provider or pharmacist if you have questions. COMMON BRAND NAME(S): FASLODEX What should I tell my health care provider before I take this medicine? They need to know if you have any of these conditions:  bleeding disorders  liver disease  low blood counts, like low white cell, platelet, or red cell counts  an unusual or allergic reaction to fulvestrant, other medicines, foods, dyes, or preservatives  pregnant or trying to get pregnant  breast-feeding How should I use this medicine? This medicine is for injection into a muscle. It is usually given by a health care professional in a hospital or clinic setting. Talk to your pediatrician regarding the use of this medicine in children. Special care may be needed. Overdosage: If you think you have taken too much of this medicine contact a poison control center or emergency room at once. NOTE: This medicine is only for you. Do not share this medicine with others. What if I miss a dose? It is important not to miss your dose. Call your doctor or health care professional if you are unable to keep an appointment. What may interact with this medicine?  medicines that treat or prevent blood clots like warfarin, enoxaparin, dalteparin, apixaban, dabigatran, and rivaroxaban This list may not describe all possible interactions. Give your health care provider a list of all the medicines, herbs, non-prescription drugs, or dietary supplements you use. Also tell them if you smoke, drink alcohol, or use illegal drugs. Some items may interact with your medicine. What should I watch for while using this medicine? Your condition will be monitored carefully while you are receiving this medicine. You will need important blood work done while you are taking  this medicine. Do not become pregnant while taking this medicine or for at least 1 year after stopping it. Women of child-bearing potential will need to have a negative pregnancy test before starting this medicine. Women should inform their doctor if they wish to become pregnant or think they might be pregnant. There is a potential for serious side effects to an unborn child. Men should inform their doctors if they wish to father a child. This medicine may lower sperm counts. Talk to your health care professional or pharmacist for more information. Do not breast-feed an infant while taking this medicine or for 1 year after the last dose. What side effects may I notice from receiving this medicine? Side effects that you should report to your doctor or health care professional as soon as possible:  allergic reactions like skin rash, itching or hives, swelling of the face, lips, or tongue  feeling faint or lightheaded, falls  pain, tingling, numbness, or weakness in the legs  signs and symptoms of infection like fever or chills; cough; flu-like symptoms; sore throat  vaginal bleeding Side effects that usually do not require medical attention (report to your doctor or health care professional if they continue or are bothersome):  aches, pains  constipation  diarrhea  headache  hot flashes  nausea, vomiting  pain at site where injected  stomach pain This list may not describe all possible side effects. Call your doctor for medical advice about side effects. You may report side effects to FDA at 1-800-FDA-1088. Where should I keep my medicine? This drug is given in a hospital or clinic and will   not be stored at home. NOTE: This sheet is a summary. It may not cover all possible information. If you have questions about this medicine, talk to your doctor, pharmacist, or health care provider.  2020 Elsevier/Gold Standard (2017-08-24 11:34:41)  

## 2019-12-26 LAB — CANCER ANTIGEN 27.29: CA 27.29: 42.5 U/mL — ABNORMAL HIGH (ref 0.0–38.6)

## 2020-01-01 ENCOUNTER — Other Ambulatory Visit: Payer: Self-pay | Admitting: Oncology

## 2020-01-01 DIAGNOSIS — E039 Hypothyroidism, unspecified: Secondary | ICD-10-CM

## 2020-01-08 ENCOUNTER — Other Ambulatory Visit: Payer: Self-pay | Admitting: Oncology

## 2020-01-08 DIAGNOSIS — M84559P Pathological fracture in neoplastic disease, hip, unspecified, subsequent encounter for fracture with malunion: Secondary | ICD-10-CM

## 2020-01-08 MED ORDER — HYDROCODONE-ACETAMINOPHEN 5-325 MG PO TABS: 1.0000 | ORAL_TABLET | Freq: Four times a day (QID) | ORAL | 0 refills | Status: DC | PRN

## 2020-01-10 ENCOUNTER — Other Ambulatory Visit: Payer: Self-pay

## 2020-01-10 ENCOUNTER — Ambulatory Visit
Admission: RE | Admit: 2020-01-10 | Discharge: 2020-01-10 | Disposition: A | Discharge status: Home / Self Care | Payer: Medicare Other | Source: Ambulatory Visit | Attending: Oncology | Admitting: Oncology

## 2020-01-10 ENCOUNTER — Other Ambulatory Visit: Payer: Self-pay | Admitting: Oncology

## 2020-01-10 DIAGNOSIS — Z170 Estrogen receptor positive status [ER+]: Secondary | ICD-10-CM

## 2020-01-10 DIAGNOSIS — C50411 Malignant neoplasm of upper-outer quadrant of right female breast: Secondary | ICD-10-CM

## 2020-01-10 DIAGNOSIS — D6859 Other primary thrombophilia: Secondary | ICD-10-CM

## 2020-01-10 DIAGNOSIS — C7951 Secondary malignant neoplasm of bone: Secondary | ICD-10-CM

## 2020-01-10 DIAGNOSIS — Z1231 Encounter for screening mammogram for malignant neoplasm of breast: Secondary | ICD-10-CM

## 2020-01-15 ENCOUNTER — Other Ambulatory Visit: Payer: Self-pay | Admitting: Oncology

## 2020-01-15 DIAGNOSIS — R928 Other abnormal and inconclusive findings on diagnostic imaging of breast: Secondary | ICD-10-CM

## 2020-01-20 IMAGING — CR DG LUMBAR SPINE COMPLETE 4+V
6 series · 6 of 6 positions shown · non-contrast
Comparison: Lumbar spine radiograph dated 12/19/2016.

CLINICAL DATA: 78-year-old female with increasing low back pain.

EXAM:
LUMBAR SPINE - COMPLETE 4+ VIEW

[t lumbar spine ap]
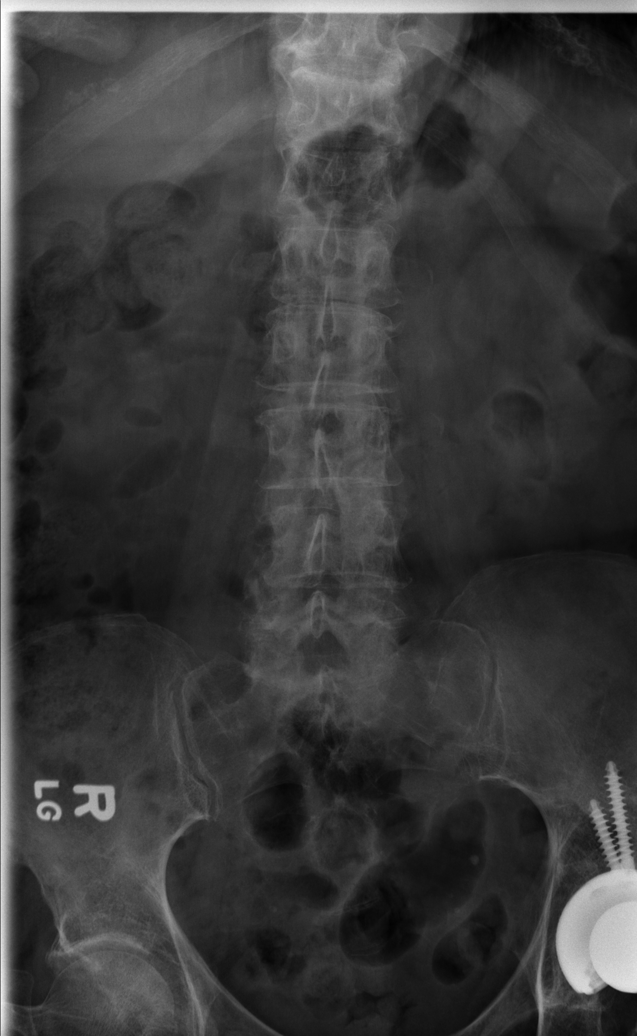

[t lumbar spine obl (1 of 2)]
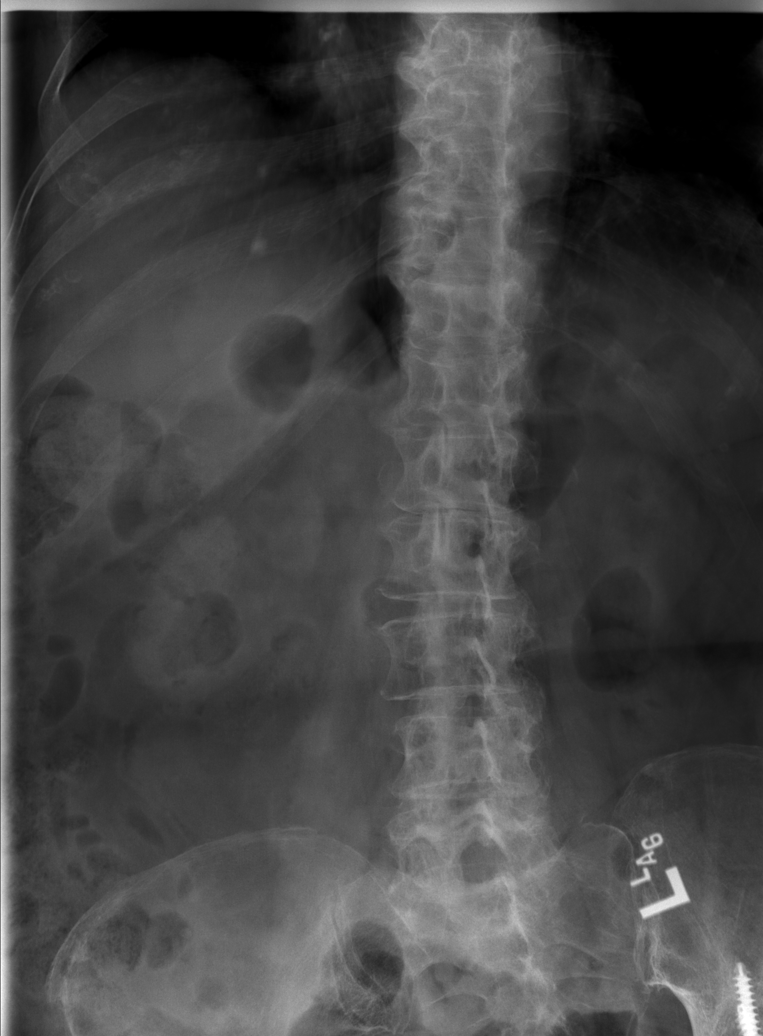

[t lumbar spine obl (2 of 2)]
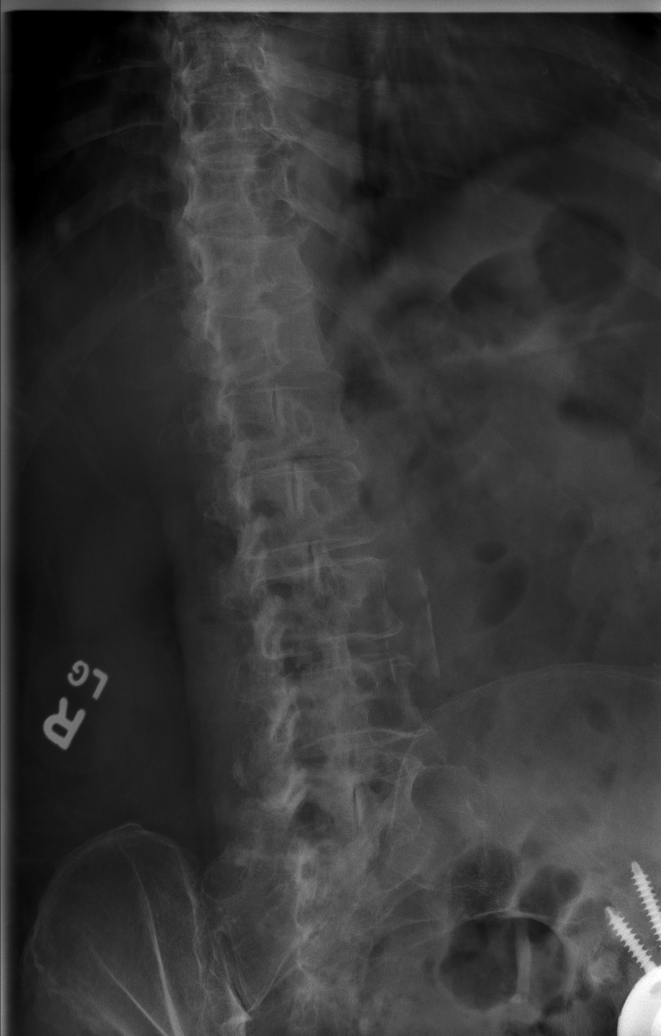

[t lumbar spine lat (1 of 2)]
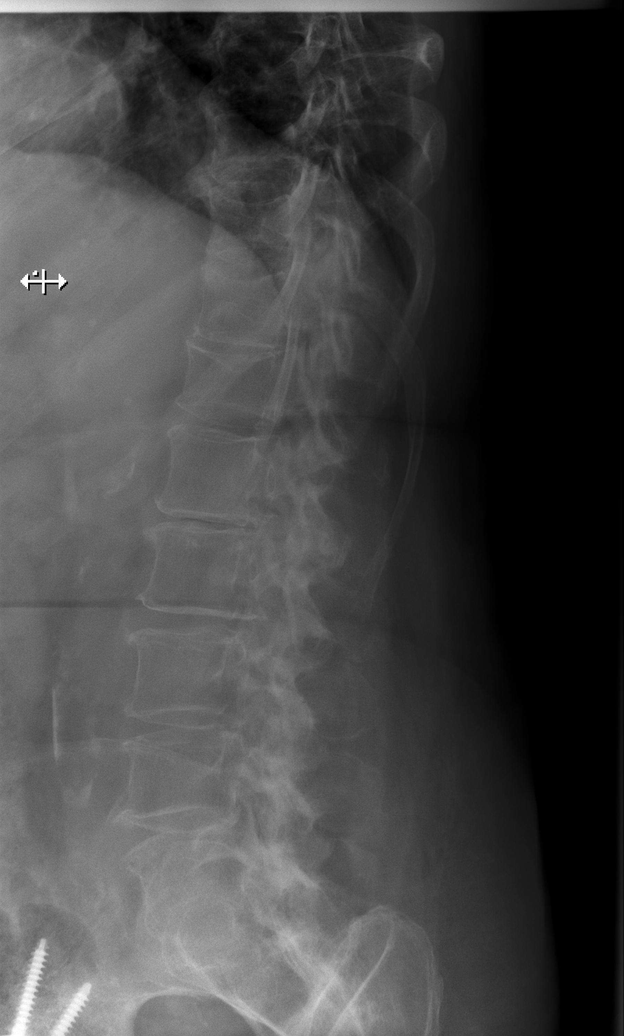

[t lumbar spine lat (2 of 2)]
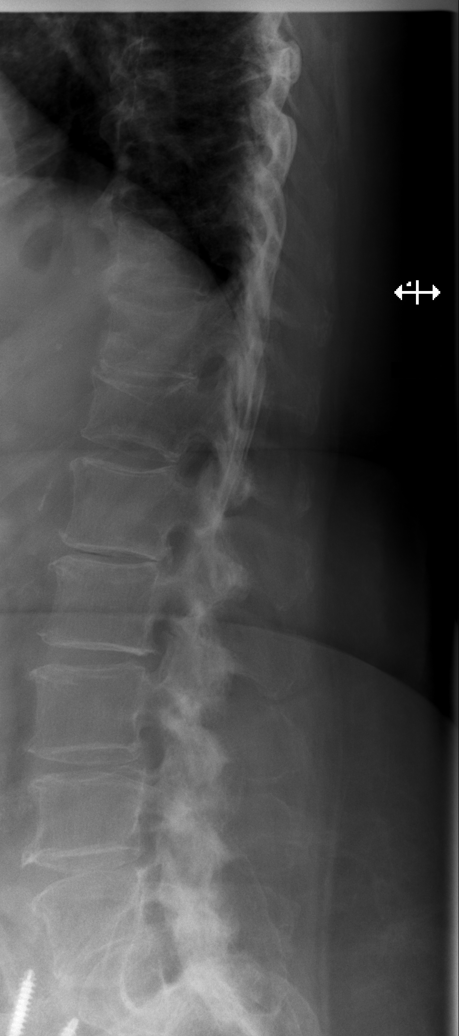

[t lumbar l-5 s-1 spot]
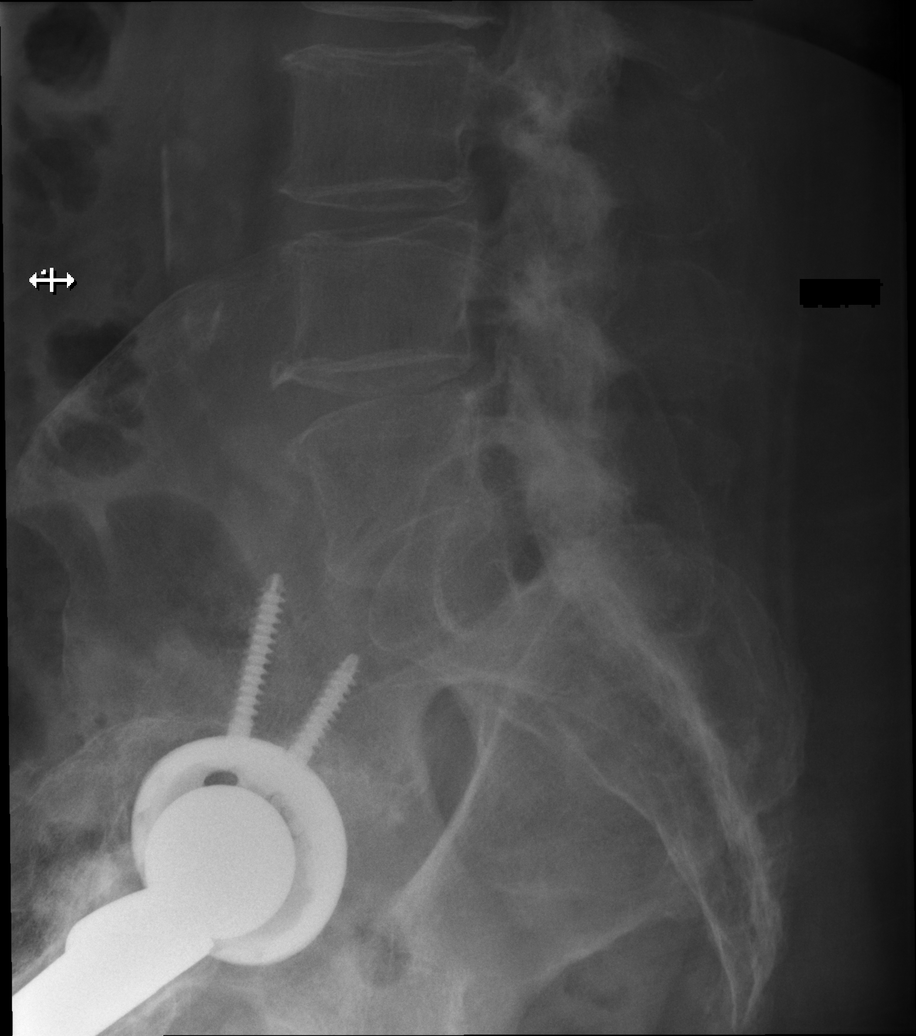

[6 of 6 positions shown; findings below may reference images not displayed]

FINDINGS: There is no acute fracture or subluxation of the lumbar spine. Old
compression fracture of the superior endplate of T12 similar to
prior radiograph. Multilevel facet arthropathy. The visualized
posterior elements are otherwise intact. The bones are osteopenic.
Partially visualized left hip arthroplasty. The soft tissues are
unremarkable. Atherosclerotic calcification of the aorta.
IMPRESSION: 1. No acute fracture or subluxation of the lumbar spine.
2. Old compression fracture of the superior endplate of T12.

## 2020-01-22 ENCOUNTER — Inpatient Hospital Stay (HOSPITAL_BASED_OUTPATIENT_CLINIC_OR_DEPARTMENT_OTHER): Payer: Medicare Other | Admitting: Adult Health

## 2020-01-22 ENCOUNTER — Inpatient Hospital Stay: Payer: Medicare Other

## 2020-01-22 ENCOUNTER — Inpatient Hospital Stay: Payer: Medicare Other | Attending: Oncology

## 2020-01-22 ENCOUNTER — Other Ambulatory Visit: Payer: Self-pay

## 2020-01-22 VITALS — BP 154/83 | HR 95 | Temp 98.6°F | Resp 18 | Ht 64.0 in | Wt 225.9 lb

## 2020-01-22 DIAGNOSIS — E039 Hypothyroidism, unspecified: Secondary | ICD-10-CM | POA: Insufficient documentation

## 2020-01-22 DIAGNOSIS — Z800 Family history of malignant neoplasm of digestive organs: Secondary | ICD-10-CM | POA: Insufficient documentation

## 2020-01-22 DIAGNOSIS — I82401 Acute embolism and thrombosis of unspecified deep veins of right lower extremity: Secondary | ICD-10-CM

## 2020-01-22 DIAGNOSIS — M8580 Other specified disorders of bone density and structure, unspecified site: Secondary | ICD-10-CM | POA: Diagnosis not present

## 2020-01-22 DIAGNOSIS — Z5111 Encounter for antineoplastic chemotherapy: Secondary | ICD-10-CM | POA: Diagnosis not present

## 2020-01-22 DIAGNOSIS — C50411 Malignant neoplasm of upper-outer quadrant of right female breast: Secondary | ICD-10-CM

## 2020-01-22 DIAGNOSIS — Z170 Estrogen receptor positive status [ER+]: Secondary | ICD-10-CM

## 2020-01-22 DIAGNOSIS — I10 Essential (primary) hypertension: Secondary | ICD-10-CM | POA: Diagnosis not present

## 2020-01-22 DIAGNOSIS — G893 Neoplasm related pain (acute) (chronic): Secondary | ICD-10-CM

## 2020-01-22 DIAGNOSIS — C7951 Secondary malignant neoplasm of bone: Secondary | ICD-10-CM | POA: Diagnosis not present

## 2020-01-22 DIAGNOSIS — Z8042 Family history of malignant neoplasm of prostate: Secondary | ICD-10-CM | POA: Insufficient documentation

## 2020-01-22 DIAGNOSIS — M84459A Pathological fracture, hip, unspecified, initial encounter for fracture: Secondary | ICD-10-CM

## 2020-01-22 DIAGNOSIS — Z7901 Long term (current) use of anticoagulants: Secondary | ICD-10-CM | POA: Insufficient documentation

## 2020-01-22 DIAGNOSIS — Z86718 Personal history of other venous thrombosis and embolism: Secondary | ICD-10-CM | POA: Insufficient documentation

## 2020-01-22 DIAGNOSIS — M899 Disorder of bone, unspecified: Secondary | ICD-10-CM

## 2020-01-22 DIAGNOSIS — Z8249 Family history of ischemic heart disease and other diseases of the circulatory system: Secondary | ICD-10-CM | POA: Insufficient documentation

## 2020-01-22 DIAGNOSIS — D6859 Other primary thrombophilia: Secondary | ICD-10-CM

## 2020-01-22 DIAGNOSIS — M84659P Pathological fracture in other disease, hip, unspecified, subsequent encounter for fracture with malunion: Secondary | ICD-10-CM

## 2020-01-22 DIAGNOSIS — Z923 Personal history of irradiation: Secondary | ICD-10-CM | POA: Diagnosis not present

## 2020-01-22 DIAGNOSIS — Z79899 Other long term (current) drug therapy: Secondary | ICD-10-CM | POA: Diagnosis not present

## 2020-01-22 DIAGNOSIS — M898X5 Other specified disorders of bone, thigh: Secondary | ICD-10-CM

## 2020-01-22 DIAGNOSIS — I700 Atherosclerosis of aorta: Secondary | ICD-10-CM

## 2020-01-22 LAB — CBC WITH DIFFERENTIAL/PLATELET
Abs Immature Granulocytes: 0.02 10*3/uL (ref 0.00–0.07)
Basophils Absolute: 0.1 10*3/uL (ref 0.0–0.1)
Basophils Relative: 1 %
Eosinophils Absolute: 0.3 10*3/uL (ref 0.0–0.5)
Eosinophils Relative: 4 %
HCT: 41.2 % (ref 36.0–46.0)
Hemoglobin: 13.3 g/dL (ref 12.0–15.0)
Immature Granulocytes: 0 %
Lymphocytes Relative: 13 %
Lymphs Abs: 1 10*3/uL (ref 0.7–4.0)
MCH: 29.2 pg (ref 26.0–34.0)
MCHC: 32.3 g/dL (ref 30.0–36.0)
MCV: 90.4 fL (ref 80.0–100.0)
Monocytes Absolute: 0.9 10*3/uL (ref 0.1–1.0)
Monocytes Relative: 12 %
Neutro Abs: 5.4 10*3/uL (ref 1.7–7.7)
Neutrophils Relative %: 70 %
Platelets: 252 10*3/uL (ref 150–400)
RBC: 4.56 MIL/uL (ref 3.87–5.11)
RDW: 14.6 % (ref 11.5–15.5)
WBC: 7.8 10*3/uL (ref 4.0–10.5)
nRBC: 0 % (ref 0.0–0.2)

## 2020-01-22 LAB — PROTIME-INR
INR: 2.6 — ABNORMAL HIGH (ref 0.8–1.2)
Prothrombin Time: 26.8 seconds — ABNORMAL HIGH (ref 11.4–15.2)

## 2020-01-22 MED ORDER — FULVESTRANT 250 MG/5ML IM SOLN
500.0000 mg | Freq: Once | INTRAMUSCULAR | Status: AC
  Administered 2020-01-22: 500 mg via INTRAMUSCULAR

## 2020-01-22 MED ORDER — FULVESTRANT 250 MG/5ML IM SOLN
INTRAMUSCULAR | Status: AC
  Filled 2020-01-22: qty 5

## 2020-01-22 NOTE — Progress Notes (Signed)
[place]  Telephone:(336) [phone] Fax:(336) [phone]     ID: Alicia Vasquez OB: Oct 11, 1940  MR#: [id number]  KNL#:[id number]  Patient Care Team: Briscoe Deutscher, DO as PCP - General (Family Medicine) Philemon Kingdom, MD as Consulting Physician (Internal Medicine) Caprice Renshaw, MD as Referring Physician (Internal Medicine) Magrinat, [name], MD as Consulting Physician (Oncology) [name], DDS as Consulting Physician (Dentistry) [name], [place] as Consulting Physician (Physician Assistant) [name] Tamala Fothergill, DPM as Consulting Physician (Podiatry) OTHER MD:   CHIEF COMPLAINT: Estrogen receptor positive breast cancer  CURRENT TREATMENT:  Fulvestrant; warfarin   INTERVAL HISTORY: Alicia Vasquez returns today for follow-up and treatment of her metastatic estrogen receptor positive breast cancer  She continues on fulvestrant.  She is tolerating this well.    She says she is taking Warfarin daily and is not having any easy bruising or bleeding.     REVIEW OF SYSTEMS: Alicia Vasquez has no new pain.  She has pain in her left hip that is chronic and relieved with hydrocodone.  PMP aware reviewed.  Last fill was on 01/16/2020.  She underwent bilateral screening mammogram in 12/2018 and asymmetry was noted and she needs to return for left diagnostic mammogram and ultrasound.  She has been under increased stress because her friend Alicia Vasquez who she lives with has her daughter and grandsons who are visiting.  She has been sleeping in a recliner, and also notes that she was accidentally hit in the head with a ball several weeks ago that her friends grandsons were throwing and her head still is sore.  She notes that she wasn't hit very hard and has no associated symptoms with it.  BREAST CANCER HISTORY: From doctor Alicia Vasquez's intake node 07/24/2013:  "79 y.o. female. Who presented with SOB and had a CT chest performed that revealed a right breast mass. Mammogram/ultrsound on  2/13 showed a mass in the 11 o'clock position in the right breast measuring 1.5 cm. Also noted was a right axillary LN measuring 1.6 cm. MRI not performed. Biopsy of mass and lymph done. 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 Lymph node + for metastatic carcinoma."  [On 09/09/2013 the patient underwent right lumpectomy and sentinel lymph node sampling. This showed ([id number]) multifocal invasive ductal carcinoma, grade 1, the largest lesion measuring 1.8 cm, the second lesion 1.2 cm. One of 4 sentinel lymph nodes was positive, with extracapsular extension. Margins were positive. HER-2 was repeated and was again negative. Further surgery 09/16/2013 obtained clear margins.  Her subsequent history is as detailed below   PAST MEDICAL HISTORY: Past Medical History:  Diagnosis Date  . Allergy   . Anxiety   . Arthritis   . Blood transfusion without reported diagnosis   . Breast cancer ([place]) 07/12/2013   Invasive Mammary Carcinoma  . DVT (deep vein thrombosis) in pregnancy   . Hypertension   . Hypothyroid   . Metastatic cancer to bone ([place]) dx'd 12/2016   hip  . Personal history of radiation therapy   . Pneumonia   . Radiation 11/21/13-01/07/14   Right Breast/Supraclavicular    PAST SURGICAL HISTORY: Past Surgical History:  Procedure Laterality Date  . BREAST LUMPECTOMY Left 2015  . BREAST LUMPECTOMY WITH RADIOACTIVE SEED LOCALIZATION Right 09/09/2013   Procedure: BREAST LUMPECTOMY WITH RADIOACTIVE SEED LOCALIZATION WITH AXILLARY NODE EXCISION;  Surgeon: [name], MD;  Location: [facility];  Service:  General;  Laterality: Right;  . DENTAL SURGERY  04/19/2012   13 TEETH REMOVED  . DILATION AND CURETTAGE OF UTERUS    . ORIF PERIPROSTHETIC FRACTURE Left 01/31/2017   Procedure: REVISION and OPEN REDUCTION INTERNAL FIXATION (ORIF) PERIPROSTHETIC FRACTURE LEFT  HIP;  Surgeon: Paralee Cancel, MD;  [facility];  Service: Orthopedics;  Laterality: Left;  120 mins  . RE-EXCISION OF BREAST LUMPECTOMY Right 09/24/2013   Procedure: RE-EXCISION OF RIGHT BREAST LUMPECTOMY;  Surgeon: [name], MD;  Location: [facility];  Service: General;  Laterality: Right;  . TOTAL HIP ARTHROPLASTY Left 01/16/2017   Procedure: TOTAL HIP ARTHROPLASTY POSTERIOR;  Surgeon: Paralee Cancel, MD;  [facility];  Service: Orthopedics;  Laterality: Left;    FAMILY HISTORY Family History  Problem Relation Age of Onset  . Heart disease Brother   . Colon cancer Brother   . Prostate cancer Brother    the patient's father died at the age of 56 after an automobile accident. The patient's mother died at the age of 65. She was a Marine scientist here in [name] in the old [facility] [employer]. She was infected with polio and was confined to a wheelchair for a good part of her life. She eventually died of pneumonia. The patient had one brother, who died with prostate cancer. She had no sisters. There is no history of breast or ovarian cancer in the family.   GYNECOLOGIC HISTORY:  Menarche age 89, first live birth age 71, the patient is GX P1. She went through the change of life at age 14. 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 She is widowed. Currently she is staying with her friend Alicia Vasquez, 77 years old, who is a retired Radio producer.  Alicia Vasquez's daughter and 2 grandsons are also currently in the home.  The patient's son Alicia Vasquez lived in [place] but more recently he moved into [place] prior home. He works in Engineer, technical sales.  His wife died from cancer some years ago.  The patient has no grandchildren. She is a Tourist information centre manager but currently attends a [employer] with her friend Alicia Vasquez    ADVANCED DIRECTIVES: Not in place   HEALTH MAINTENANCE: Social History   Tobacco Use  . Smoking  status: Never Smoker  . Smokeless tobacco: Never Used  Substance Use Topics  . Alcohol use: No    Alcohol/week: 0.0 standard drinks  . Drug use: No     Colonoscopy: Never  PAP:  Bone density: 09/20/2016 showed a T score of -2.2  Lipid panel:  Allergies  Allergen Reactions  . Anesthetics, Amide Hypertension  . Benadryl [Diphenhydramine Hcl] Other (See Comments)    Dizziness  . Carbocaine [Mepivacaine Hcl] Hypertension  . Codeine Other (See Comments)    Dizziness  . Epinephrine Hypertension  . Sulfa Antibiotics Other (See Comments)    dizziness  . Latex Other (See Comments)    Blisters in mouth  . Tramadol     Sedation.   Marland Kitchen Penicillins Rash    Has patient had a PCN reaction causing immediate rash, facial/tongue/throat swelling, SOB or lightheadedness with hypotension: Unknown Has patient had a PCN reaction causing severe rash involving mucus membranes or skin necrosis: Unknown Has patient had a PCN reaction that required hospitalization: Unknown Has patient had a PCN reaction occurring within the last 10 years: Unknown If all of the above answers are "NO", then may proceed with Cephalosporin use.  Current Outpatient Medications  Medication Sig Dispense Refill  . Cholecalciferol (VITAMIN D3) 5000 UNITS TABS Take 5,000 Units by mouth daily.     Marland Kitchen HYDROcodone-acetaminophen (NORCO/VICODIN) 5-325 MG tablet Take 1 tablet by mouth every 6 (six) hours as needed for moderate pain. 120 tablet 0  . ketoconazole (NIZORAL) 2 % cream Apply 1 application topically daily. 15 g 0  . SYNTHROID 125 MCG tablet TAKE 1 TABLET BY MOUTH EVERY DAY BEFORE BREAKFAST 90 tablet 4  . vitamin C (ASCORBIC ACID) 250 MG tablet Take 250 mg by mouth 3 (three) times daily.    Marland Kitchen warfarin (COUMADIN) 5 MG tablet TAKE 1.5-2 TABLETS ONE TIME ONLY AT 6 PM FOR 1 DOSE. TAKE 7.5 MG ALTERNATING WITH 10 MG DAILY 270 tablet 1   No current facility-administered medications for this visit.    OBJECTIVE: white woman  examined in a wheelchair  Vitals:   01/22/20 1343  BP: (!) 154/83  Pulse: 95  Resp: 18  Temp: 98.6 F (37 C)  SpO2: 96%   Wt Readings from Last 3 Encounters:  01/22/20 225 lb 14.4 oz (102.5 kg)  10/02/19 227 lb 11.2 oz (103.3 kg)  06/04/19 228 lb (103.4 kg)   Body mass index is 38.78 kg/m.    ECOG FS:2 - Symptomatic, <50% confined to bed  GENERAL: Patient is a well appearing female in no acute distress, examined in wheelchair HEENT:  Sclerae anicteric.  Mask in place. Neck is supple.  NODES:  No cervical, supraclavicular, or axillary lymphadenopathy palpated.  BREAST EXAM:  Deferred. LUNGS:  Clear to auscultation bilaterally.  No wheezes or rhonchi. HEART:  Regular rate and rhythm. No murmur appreciated. ABDOMEN:  Soft, nontender.  Positive, normoactive bowel sounds. No organomegaly palpated. MSK:  No focal spinal tenderness to palpation. EXTREMITIES:  No peripheral edema.   SKIN:  Clear with no obvious rashes or skin changes. No nail dyscrasia. NEURO:  Nonfocal. Well oriented.  Appropriate affect.     LAB RESULTS:  CMP     Component Value Date/Time   NA 141 08/07/2019 1305   NA 141 05/16/2017 1416   K 4.4 08/07/2019 1305   K 3.8 05/16/2017 1416   CL 105 08/07/2019 1305   CO2 28 08/07/2019 1305   CO2 27 05/16/2017 1416   GLUCOSE 153 (H) 08/07/2019 1305   GLUCOSE 140 05/16/2017 1416   BUN 13 08/07/2019 1305   BUN 13.2 05/16/2017 1416   CREATININE 0.89 08/07/2019 1305   CREATININE 0.77 03/21/2018 1417   CREATININE 0.80 09/22/2017 1555   CREATININE 0.8 05/16/2017 1416   CALCIUM 10.9 (H) 08/07/2019 1305   CALCIUM 11.0 (H) 05/16/2017 1416   PROT 6.8 08/07/2019 1305   PROT 6.9 05/16/2017 1416   ALBUMIN 3.8 08/07/2019 1305   ALBUMIN 3.6 05/16/2017 1416   AST 13 (L) 08/07/2019 1305   AST 14 (L) 03/21/2018 1417   AST 13 05/16/2017 1416   ALT 14 08/07/2019 1305   ALT 14 03/21/2018 1417   ALT <6 05/16/2017 1416   ALKPHOS 157 (H) 08/07/2019 1305   ALKPHOS 130  05/16/2017 1416   BILITOT 0.3 08/07/2019 1305   BILITOT 0.3 03/21/2018 1417   BILITOT 0.31 05/16/2017 1416   GFRNONAA >60 08/07/2019 1305   GFRNONAA >60 03/21/2018 1417   GFRNONAA 75 08/19/2015 1602   GFRAA >60 08/07/2019 1305   GFRAA >60 03/21/2018 1417   GFRAA 87 08/19/2015 1602    I No results found for: SPEP  Lab Results  Component Value  Date   WBC 7.8 01/22/2020   NEUTROABS 5.4 01/22/2020   HGB 13.3 01/22/2020   HCT 41.2 01/22/2020   MCV 90.4 01/22/2020   PLT 252 01/22/2020      Chemistry      Component Value Date/Time   NA 141 08/07/2019 1305   NA 141 05/16/2017 1416   K 4.4 08/07/2019 1305   K 3.8 05/16/2017 1416   CL 105 08/07/2019 1305   CO2 28 08/07/2019 1305   CO2 27 05/16/2017 1416   BUN 13 08/07/2019 1305   BUN 13.2 05/16/2017 1416   CREATININE 0.89 08/07/2019 1305   CREATININE 0.77 03/21/2018 1417   CREATININE 0.80 09/22/2017 1555   CREATININE 0.8 05/16/2017 1416      Component Value Date/Time   CALCIUM 10.9 (H) 08/07/2019 1305   CALCIUM 11.0 (H) 05/16/2017 1416   ALKPHOS 157 (H) 08/07/2019 1305   ALKPHOS 130 05/16/2017 1416   AST 13 (L) 08/07/2019 1305   AST 14 (L) 03/21/2018 1417   AST 13 05/16/2017 1416   ALT 14 08/07/2019 1305   ALT 14 03/21/2018 1417   ALT <6 05/16/2017 1416   BILITOT 0.3 08/07/2019 1305   BILITOT 0.3 03/21/2018 1417   BILITOT 0.31 05/16/2017 1416       No results found for: LABCA2  No components found for: LABCA125  Recent Labs  Lab 01/22/20 1321  INR 2.6*    Urinalysis    Component Value Date/Time   COLORURINE YELLOW 06/04/2019 1342   APPEARANCEUR CLOUDY (A) 06/04/2019 1342   LABSPEC 1.011 06/04/2019 1342   LABSPEC 1.010 09/22/2016 1553   PHURINE 7.0 06/04/2019 1342   GLUCOSEU NEGATIVE 06/04/2019 1342   GLUCOSEU Negative 09/22/2016 1553   HGBUR NEGATIVE 06/04/2019 1342   BILIRUBINUR NEGATIVE 06/04/2019 1342   BILIRUBINUR Negative 03/16/2018 1433   BILIRUBINUR Negative 09/22/2016 1553   KETONESUR  NEGATIVE 06/04/2019 1342   PROTEINUR NEGATIVE 06/04/2019 1342   UROBILINOGEN 0.2 03/16/2018 1433   UROBILINOGEN 0.2 11/15/2016 1358   UROBILINOGEN 0.2 09/22/2016 1553   NITRITE NEGATIVE 06/04/2019 1342   LEUKOCYTESUR NEGATIVE 06/04/2019 1342   LEUKOCYTESUR Negative 09/22/2016 1553    STUDIES: MM 3D SCREEN BREAST BILATERAL  Result Date: 01/14/2020 CLINICAL DATA:  Screening. EXAM: DIGITAL SCREENING BILATERAL MAMMOGRAM WITH TOMO AND CAD COMPARISON:  Previous exam(s). ACR Breast Density Category c: The breast tissue is heterogeneously dense, which may obscure small masses. FINDINGS: In the left breast, a possible asymmetry warrants further evaluation. In the right breast, no findings suspicious for malignancy. Images were processed with CAD. IMPRESSION: Further evaluation is suggested for possible asymmetry in the left breast. RECOMMENDATION: Diagnostic mammogram and possibly ultrasound of the left breast. (Code:FI-L-60M) The patient will be contacted regarding the findings, and additional imaging will be scheduled. BI-RADS CATEGORY  0: Incomplete. Need additional imaging evaluation and/or prior mammograms for comparison. Electronically Signed   By: Lovey Newcomer M.D.   On: 01/14/2020 10:17     ASSESSMENT: 79 y.o. [place] woman status post right breast upper outer quadrant lumpectomy and sentinel lymph node sampling 09/09/2013 for an mpT1c pN1a, stage IIA invasive ductal carcinoma, estrogen and progesterone receptor both 100% positive with strong staining intensity, MIB-1 of 17% and no HER-2 amplification  (1) additional surgery for margin clearance 09/16/2013 obtained negative margins  (2) Oncotype DX recurrence score of 4 predicts a risk of outside the breast recurrence within 10 years of 7% if the patient's only systemic therapy is tamoxifen for 5 years. It also predicts no  benefit from chemotherapy  (3) adjuvant radiation completed 01/07/2014  (4) anastrozole started 02/27/2014 stopped  within 2 weeks because of arm swelling.   (a) bone density April 2016 showed osteopenia, with a t-score of -1.6  (b) anastrozole resumed 12/17/2015  (c) Bone density 09/20/2016 showed a T score of -2.2  (5) history of left lower extremity DVT 11/23/2012, initially on rivaroxaban, which caused chest pain, switch to Coumadin July 2014  (b) coumadin dose increased to 7.5/10 mg alternating days as of 06/13/2017   METASTATIC DISEASE: August 2018 (6) status post left total hip replacement 01/16/2017 for estrogen receptor positive adenocarcinoma.  (a) CA 27-29 was 46.4 as of 04/18/2017  (b) chest CT scan 05/02/2017 shows no lung or liver lesion concern; it does show aortic atherosclerosis  (c) baseline bone scan 05/02/2017 was negative  (d) PET scan 09/12/2017 shows no active disease, including bone  (e) PET scan and CT chest on 05/29/2018 show no active disease  (f) chest CT and bone scan 03/18/2019 showed no evidence of active disease  (7) fulvestrant started 04/18/2017  (8) denosumab/Xgeva started 05/16/2017  (a) changed to every 12-weeks after 10/03/2017 dose   (b) held starting with 06/13/2018 dose due to dental concerns  (9) unprovoked deep vein thrombosis involving the left posterior tibial v documented 11/23/2012, resolved on repeat 06/27/2013 and not recurrent on multiple Dopplers since, most recent 03/06/2018  (a) on chronic anticoagulation with warfarin given ongoing risks (stage IV breast cancer, relative immobility secondary to claudication   PLAN:  [place] is now 2-1/2 years out from definitive diagnosis of metastatic breast cancer.  She continues on Fulvestrant given every 4 weeks.  She is tolerating this well.  She will continue this.  There has been no sign of disease progression, and she is due for her restaging soon.  I placed orders fr bone scan and CT chest.    I spent a great deal of time with her today talking about her situation at home, and how difficult it has been with  her friends children who have moved in.  She is at times tearful.  She notes that she does feel safe, she just wishes they weren't so hateful and were more considerate.  She knows she can reach out to [name] for any further support if needed.    She is eating and drinking well and is taking all of her medications as prescribed.  She will return for her fulvestrant monthly and we will see her back in 3 months as scheduled.  She knows to call for any questions that may arise between now and her next appointment.  We are happy to see her sooner if needed.    Total encounter time 30 minutes.Wilber Bihari, NP 01/22/20 2:02 PM Medical Oncology and Hematology [facility] [place], [place] [address] Tel. [phone]    Fax. [phone]   *Total Encounter Time as defined by the Centers for Medicare and Medicaid Services includes, in addition to the face-to-face time of a patient visit (documented in the note above) non-face-to-face time: obtaining and reviewing outside history, ordering and reviewing medications, tests or procedures, care coordination (communications with other health care professionals or caregivers) and documentation in the medical record.

## 2020-01-22 NOTE — Patient Instructions (Signed)
Fulvestrant injection What is this medicine? FULVESTRANT (ful VES trant) blocks the effects of estrogen. It is used to treat breast cancer. This medicine may be used for other purposes; ask your health care provider or pharmacist if you have questions. COMMON BRAND NAME(S): FASLODEX What should I tell my health care provider before I take this medicine? They need to know if you have any of these conditions:  bleeding disorders  liver disease  low blood counts, like low white cell, platelet, or red cell counts  an unusual or allergic reaction to fulvestrant, other medicines, foods, dyes, or preservatives  pregnant or trying to get pregnant  breast-feeding How should I use this medicine? This medicine is for injection into a muscle. It is usually given by a health care professional in a hospital or clinic setting. Talk to your pediatrician regarding the use of this medicine in children. Special care may be needed. Overdosage: If you think you have taken too much of this medicine contact a poison control center or emergency room at once. NOTE: This medicine is only for you. Do not share this medicine with others. What if I miss a dose? It is important not to miss your dose. Call your doctor or health care professional if you are unable to keep an appointment. What may interact with this medicine?  medicines that treat or prevent blood clots like warfarin, enoxaparin, dalteparin, apixaban, dabigatran, and rivaroxaban This list may not describe all possible interactions. Give your health care provider a list of all the medicines, herbs, non-prescription drugs, or dietary supplements you use. Also tell them if you smoke, drink alcohol, or use illegal drugs. Some items may interact with your medicine. What should I watch for while using this medicine? Your condition will be monitored carefully while you are receiving this medicine. You will need important blood work done while you are taking  this medicine. Do not become pregnant while taking this medicine or for at least 1 year after stopping it. Women of child-bearing potential will need to have a negative pregnancy test before starting this medicine. Women should inform their doctor if they wish to become pregnant or think they might be pregnant. There is a potential for serious side effects to an unborn child. Men should inform their doctors if they wish to father a child. This medicine may lower sperm counts. Talk to your health care professional or pharmacist for more information. Do not breast-feed an infant while taking this medicine or for 1 year after the last dose. What side effects may I notice from receiving this medicine? Side effects that you should report to your doctor or health care professional as soon as possible:  allergic reactions like skin rash, itching or hives, swelling of the face, lips, or tongue  feeling faint or lightheaded, falls  pain, tingling, numbness, or weakness in the legs  signs and symptoms of infection like fever or chills; cough; flu-like symptoms; sore throat  vaginal bleeding Side effects that usually do not require medical attention (report to your doctor or health care professional if they continue or are bothersome):  aches, pains  constipation  diarrhea  headache  hot flashes  nausea, vomiting  pain at site where injected  stomach pain This list may not describe all possible side effects. Call your doctor for medical advice about side effects. You may report side effects to FDA at 1-800-FDA-1088. Where should I keep my medicine? This drug is given in a hospital or clinic and will   not be stored at home. NOTE: This sheet is a summary. It may not cover all possible information. If you have questions about this medicine, talk to your doctor, pharmacist, or health care provider.  2020 Elsevier/Gold Standard (2017-08-24 11:34:41)  

## 2020-01-23 ENCOUNTER — Telehealth: Payer: Self-pay | Admitting: Adult Health

## 2020-01-23 LAB — CANCER ANTIGEN 27.29: CA 27.29: 46.1 U/mL — ABNORMAL HIGH (ref 0.0–38.6)

## 2020-01-23 NOTE — Telephone Encounter (Signed)
Called pt to scheduled Covid vaccines per 8/25 los. Left voicemail for pt to call back to get appts scheduled. I did not want to scheduled vaccine for pt wihtout knowing whether it was a first vaccine, 2nd dose, or booster.

## 2020-01-28 ENCOUNTER — Ambulatory Visit
Admission: RE | Admit: 2020-01-28 | Discharge: 2020-01-28 | Disposition: A | Discharge status: Home / Self Care | Payer: Medicare Other | Source: Ambulatory Visit | Attending: Oncology | Admitting: Oncology

## 2020-01-28 ENCOUNTER — Telehealth: Payer: Self-pay | Admitting: *Deleted

## 2020-01-28 ENCOUNTER — Other Ambulatory Visit: Payer: Self-pay

## 2020-01-28 DIAGNOSIS — R922 Inconclusive mammogram: Secondary | ICD-10-CM | POA: Diagnosis not present

## 2020-01-28 DIAGNOSIS — R928 Other abnormal and inconclusive findings on diagnostic imaging of breast: Secondary | ICD-10-CM

## 2020-01-28 DIAGNOSIS — N6323 Unspecified lump in the left breast, lower outer quadrant: Secondary | ICD-10-CM | POA: Diagnosis not present

## 2020-01-28 NOTE — Telephone Encounter (Signed)
Message left by the patient at 315 pm approximately stating " I think I have another urinary tract infection "  Return call number given as [phone].  This RN returned and received verified VM- message left informing pt above may be  treated best by her primary MD due to not directly related to her cancer diagnosis- and need for further assessment.  This RN's name and return call number given if needed.

## 2020-02-04 ENCOUNTER — Telehealth: Payer: Self-pay | Admitting: *Deleted

## 2020-02-04 ENCOUNTER — Telehealth (INDEPENDENT_AMBULATORY_CARE_PROVIDER_SITE_OTHER): Payer: Medicare Other | Admitting: Family Medicine

## 2020-02-04 ENCOUNTER — Other Ambulatory Visit: Payer: Self-pay

## 2020-02-04 DIAGNOSIS — R300 Dysuria: Secondary | ICD-10-CM

## 2020-02-04 MED ORDER — NITROFURANTOIN MONOHYD MACRO 100 MG PO CAPS: 100.0000 mg | ORAL_CAPSULE | Freq: Two times a day (BID) | ORAL | 0 refills | Status: DC

## 2020-02-04 NOTE — Progress Notes (Signed)
Virtual Visit via Telephone Note  I connected with Alicia Vasquez on 02/04/20 at  6:00 PM EDT by telephone and verified that I am speaking with the correct person using two identifiers.   I discussed the limitations, risks, security and privacy concerns of performing an evaluation and management service by telephone and the availability of in person appointments. I also discussed with the patient that there may be a patient responsible charge related to this service. The patient expressed understanding and agreed to proceed.  Location patient: home, [place] Location provider: work or home office Participants present for the call: patient, provider Patient did not have a visit in the prior 7 days to address this/these issue(s).   History of Present Illness:   Acute visit for dysuria: -started last week -symptoms include urinary frequency, urgency, some lower back pain, dysuria, she thought she saw a little blood in the urine a few days ago -denies fever over 100 - had temp of 99.2 -denies vomiting, flank pain, malaise, vaginal symptoms -reports a history of UTI in the past  Observations/Objective: Patient sounds cheerful and well on the phone. I do not appreciate any SOB. Speech and thought processing are grossly intact. Patient reported vitals:  Assessment and Plan:  Dysuria  -we discussed possible serious and likely etiologies, options for evaluation and workup, limitations of telemedicine visit vs in person visit, treatment, treatment risks and precautions. Pt prefers to treat via telemedicine empirically rather then risking or undertaking an in person visit at this moment. She opted to try Macrobid 100mg  bid x 7 days. Also sent message to [place] as she needs new PCP since her PCP left the practice. She prefers to see Dr. Jerline Pain if possible as she saw him in the past.  Advised to seek prompt follow up telemedicine visit or in person care if worsening, new symptoms arise, or if is  not improving with treatment.  Follow Up Instructions:  I did not refer this patient for an OV in the next 24 hours for this/these issue(s).  I discussed the assessment and treatment plan with the patient. The patient was provided an opportunity to ask questions and all were answered. The patient agreed with the plan and demonstrated an understanding of the instructions.   The patient was advised to call back or seek an in-person evaluation if the symptoms worsen or if the condition fails to improve as anticipated.  I provided 18  minutes of non-face-to-face time during this encounter.   Lucretia Kern, DO

## 2020-02-04 NOTE — Patient Instructions (Signed)
-  I sent the medication(s) we discussed to your pharmacy: Meds ordered this encounter  Medications  . nitrofurantoin, macrocrystal-monohydrate, (MACROBID) 100 MG capsule    Sig: Take 1 capsule (100 mg total) by mouth 2 (two) times daily.    Dispense:  14 capsule    Refill:  0    I hope you are feeling better soon! Seek care promptly if your symptoms worsen, new concerns arise or you are not improving with treatment.  

## 2020-02-04 NOTE — Telephone Encounter (Signed)
This RN received VM left by the patient stating she has not received a return call from last week " and I have a UTI "  She left her cell number and number of friend of [phone].  VM also left by a Alicia Vasquez stating she is friend of Alicia Vasquez's and that this office has not returned Alicia Vasquez's calls. " she has a UTI and your office gave her an antibiotic last year in 2019 when she had this and cleared it up " " all she needs is a prescription for an antibiotic "  No number given for Alicia Vasquez.  This RN called pt and obtained VM- detailed message left informing pt of need to follow up with Primary MD per her symptoms not related to current therapy in this office as well as this RN left a VM last week regarding the above.  This RN then called the second given number and obtained an answering machine- message left that call was being returned to pt.

## 2020-02-05 NOTE — Progress Notes (Signed)
LVM asking patient to give the office a call back.  

## 2020-02-14 ENCOUNTER — Other Ambulatory Visit: Payer: Self-pay

## 2020-02-14 ENCOUNTER — Other Ambulatory Visit (HOSPITAL_COMMUNITY): Payer: Medicare Other

## 2020-02-14 ENCOUNTER — Ambulatory Visit (INDEPENDENT_AMBULATORY_CARE_PROVIDER_SITE_OTHER): Payer: Medicare Other | Admitting: Family Medicine

## 2020-02-14 ENCOUNTER — Encounter: Payer: Self-pay | Admitting: Family Medicine

## 2020-02-14 ENCOUNTER — Telehealth: Payer: Self-pay | Admitting: Family Medicine

## 2020-02-14 VITALS — BP 138/80 | HR 92 | Temp 97.2°F

## 2020-02-14 DIAGNOSIS — R300 Dysuria: Secondary | ICD-10-CM

## 2020-02-14 DIAGNOSIS — B373 Candidiasis of vulva and vagina: Secondary | ICD-10-CM

## 2020-02-14 DIAGNOSIS — Z7901 Long term (current) use of anticoagulants: Secondary | ICD-10-CM

## 2020-02-14 DIAGNOSIS — C7951 Secondary malignant neoplasm of bone: Secondary | ICD-10-CM

## 2020-02-14 DIAGNOSIS — Z170 Estrogen receptor positive status [ER+]: Secondary | ICD-10-CM

## 2020-02-14 DIAGNOSIS — C50411 Malignant neoplasm of upper-outer quadrant of right female breast: Secondary | ICD-10-CM | POA: Diagnosis not present

## 2020-02-14 DIAGNOSIS — B3731 Acute candidiasis of vulva and vagina: Secondary | ICD-10-CM

## 2020-02-14 LAB — POCT URINALYSIS DIPSTICK
Bilirubin, UA: NEGATIVE
Blood, UA: NEGATIVE
Glucose, UA: NEGATIVE
Ketones, UA: NEGATIVE
Leukocytes, UA: NEGATIVE
Nitrite, UA: NEGATIVE
Protein, UA: NEGATIVE
Spec Grav, UA: <=1.005 — AB (ref 1.010–1.025)
Urobilinogen, UA: 0.2 E.U./dL
pH, UA: 7 (ref 5.0–8.0)

## 2020-02-14 MED ORDER — NYSTATIN 100000 UNIT/GM EX CREA: 1.0000 "application " | TOPICAL_CREAM | Freq: Two times a day (BID) | CUTANEOUS | 0 refills | Status: AC

## 2020-02-14 MED ORDER — FLUCONAZOLE 150 MG PO TABS: ORAL_TABLET | ORAL | 0 refills | Status: DC

## 2020-02-14 NOTE — Progress Notes (Signed)
Subjective   CC:  Chief Complaint  Patient presents with  . Urinary Tract Infection    symptoms started after recent chemo injections    Same day acute visit; PCP not available. New pt to me. Chart reviewed.  Reviewed recent telehealth visit for possible UTI treated with macrobid 02/04/20  HPI: Alicia Vasquez is a 79 y.o. female who presents to the office today to address the problems listed above in the chief complaint.  Patient reports dysuria and urinary frequency that initially started in early September after getting her chemo injections. Also with gross hematuria. After macrobid, her sxs improved but never resolved: still with mild dysuria. No fevers, flank pain, gross hematuria. She may have some vaginal irritation w/o discharge.  Her last UTI was in 2017. No n/v. She has metastatic breast cancer.  Pt was not able to urinate during visit; brought back sample later in the day.  (hat specimen, not clean catch)  Assessment  1. Dysuria   2. Yeast infection involving the vagina and surrounding area   3. Bone metastases ([place])   4. Long term current use of anticoagulant therapy   5. Malignant neoplasm of upper-outer quadrant of right breast in female, estrogen receptor positive ([place])      Plan   Dysuria and yeast vulvitis: monistat cream and await urine culture. Hard to interpret dip given could be dirty sample; also had been on macrobid. Is pcn and septra allergic. Also is on coumadin. Due to high risk possibility of drug interactions, will await urine culture and treat if positive. Pt notified.   Follow up: needs to establish with new primary.     No orders of the defined types were placed in this encounter.  Meds ordered this encounter  Medications  . DISCONTD: fluconazole (DIFLUCAN) 150 MG tablet    Sig: Take one tablet today    Dispense:  1 tablet    Refill:  0  . nystatin cream (MYCOSTATIN)    Sig: Apply 1 application topically 2 (two) times daily for 7 days.  Apply to affected area    Dispense:  30 g    Refill:  0      I reviewed the patients updated PMH, FH, and SocHx.    Patient Active Problem List   Diagnosis Date Noted  . Morbid obesity with BMI of 40.0-44.9, adult ([place]) 10/31/2018  . Aortic atherosclerosis ([place]) 06/13/2017  . Peri-prosthetic fracture of shaft of femur 01/26/2017  . Pain from bone metastases ([place]) 01/25/2017  . Bone metastases ([place]) 01/25/2017  . Endometrial hyperplasia 01/16/2017  . Lytic bone lesion of left femur 01/15/2017  . Hip fracture, pathological ([place]) 01/15/2017  . Lymphedema 09/17/2015  . Long term current use of anticoagulant therapy 08/23/2015  . DVT, lower extremity ([place]) 06/18/2015  . Bilateral knee pain 04/03/2015  . Arm edema 08/28/2014  . Vitamin D deficiency 04/23/2014  . Hyperparathyroidism ([place]) 04/23/2014  . Depression 07/18/2013  . Malignant neoplasm of upper-outer quadrant of right breast in female, estrogen receptor positive ([place]) 07/15/2013  . Overactive bladder 01/30/2013  . Primary hypercoagulable state ([place]) 12/19/2012  . HTN (hypertension) 09/27/2011  . Hearing loss 09/27/2011  . Seasonal allergies 09/27/2011  . Hypothyroid 08/26/2011   Current Meds  Medication Sig  . Cholecalciferol (VITAMIN D3) 5000 UNITS TABS Take 5,000 Units by mouth daily.   Marland Kitchen HYDROcodone-acetaminophen (NORCO/VICODIN) 5-325 MG tablet Take 1 tablet by mouth every 6 (six) hours as needed for moderate pain.  Marland Kitchen ketoconazole (NIZORAL)  2 % cream Apply 1 application topically daily.  Marland Kitchen SYNTHROID 125 MCG tablet TAKE 1 TABLET BY MOUTH EVERY DAY BEFORE BREAKFAST  . vitamin C (ASCORBIC ACID) 250 MG tablet Take 250 mg by mouth 3 (three) times daily.  Marland Kitchen warfarin (COUMADIN) 5 MG tablet TAKE 1.5-2 TABLETS ONE TIME ONLY AT 6 PM FOR 1 DOSE. TAKE 7.5 MG ALTERNATING WITH 10 MG DAILY    Review of Systems: Cardiovascular: negative for chest pain Respiratory: negative for SOB or persistent cough Gastrointestinal: negative  for abdominal pain Constitutional: Negative for fever malaise or anorexia  Objective  Vitals: BP 138/80   Pulse 92   Temp (!) 97.2 F (36.2 C) (Temporal)   SpO2 97%  General: no acute distress , sitting comfortably in wheel chair, HOH Psych:  Alert and oriented, normal mood and affect Cardiovascular:  RRR without murmur or gallop. no peripheral edema Respiratory:  Good breath sounds bilaterally, CTAB with normal respiratory effort Gastrointestinal: soft, flat abdomen, normal active bowel sounds, no palpable masses, no hepatosplenomegaly, no appreciated hernias, NO CVAT, no suprapubic ttp w/o rebound or guarding Vulva with redness  Office Visit on 02/14/2020  Component Date Value Ref Range Status  . Color, UA 02/14/2020 yellow   Final  . Clarity, UA 02/14/2020 clear   Final  . Glucose, UA 02/14/2020 Negative  Negative Final  . Bilirubin, UA 02/14/2020 negative   Final  . Ketones, UA 02/14/2020 negative   Final  . Spec Grav, UA 02/14/2020 <=1.005* 1.010 - 1.025 Final  . Blood, UA 02/14/2020 negative   Final  . pH, UA 02/14/2020 7.0  5.0 - 8.0 Final  . Protein, UA 02/14/2020 Negative  Negative Final  . Urobilinogen, UA 02/14/2020 0.2  0.2 or 1.0 E.U./dL Final  . Nitrite, UA 02/14/2020 negative   Final  . Leukocytes, UA 02/14/2020 Negative  Negative Final    Commons side effects, risks, benefits, and alternatives for medications and treatment plan prescribed today were discussed, and the patient expressed understanding of the given instructions. Patient is instructed to call or message via MyChart if he/she has any questions or concerns regarding our treatment plan. No barriers to understanding were identified. We discussed Red Flag symptoms and signs in detail. Patient expressed understanding regarding what to do in case of urgent or emergency type symptoms.   Medication list was reconciled, printed and provided to the patient in AVS. Patient instructions and summary information was  reviewed with the patient as documented in the AVS. This note was prepared with assistance of Dragon voice recognition software. Occasional wrong-word or sound-a-like substitutions may have occurred due to the inherent limitations of voice recognition software

## 2020-02-14 NOTE — Telephone Encounter (Signed)
Pharmacist has called in stating that patient was prescribed fluconazole.  States patient is also on warfarin and that this is a major drug interaction.   Is requesting a call back for approval to fill this med.

## 2020-02-14 NOTE — Telephone Encounter (Signed)
Please advise 

## 2020-02-14 NOTE — Telephone Encounter (Signed)
I'm aware. Pt may use topical creams first but if not improving, benefit of 1 dose of diflucan outweighs risk and pt can get it filled.

## 2020-02-14 NOTE — Patient Instructions (Signed)
Please return to establish care with Dr. Jerline Pain.   If you have any questions or concerns, please don't hesitate to send me a message via MyChart or call the office at [phone]. Thank you for visiting with [name] today! It's our pleasure caring for you.

## 2020-02-14 NOTE — Telephone Encounter (Signed)
Shival - Pharmacist with [facility] is refusing to fill medication without any kind of monitoring in place over the next 72 hours and a recheck of INR by Sunday.

## 2020-02-16 LAB — URINE CULTURE
MICRO NUMBER:: 10964706
SPECIMEN QUALITY:: ADEQUATE

## 2020-02-17 ENCOUNTER — Other Ambulatory Visit: Payer: Self-pay

## 2020-02-17 DIAGNOSIS — N390 Urinary tract infection, site not specified: Secondary | ICD-10-CM

## 2020-02-17 NOTE — Progress Notes (Signed)
Please call patient: I have reviewed his/her lab results. She has a UTI as she knows, the bacteria is resistant to many antibiotics including the one she has taken (macrobid). Due to her multiple drug allergies and this specific bacteria, she needs ceftriaxone 500mg  IM daily x 5 days. Please consult home health to give injections.

## 2020-02-18 ENCOUNTER — Other Ambulatory Visit: Payer: Self-pay

## 2020-02-18 ENCOUNTER — Other Ambulatory Visit: Payer: Self-pay | Admitting: Family Medicine

## 2020-02-18 ENCOUNTER — Telehealth: Payer: Self-pay

## 2020-02-18 DIAGNOSIS — Z7901 Long term (current) use of anticoagulants: Secondary | ICD-10-CM

## 2020-02-18 DIAGNOSIS — Z79899 Other long term (current) drug therapy: Secondary | ICD-10-CM

## 2020-02-18 DIAGNOSIS — N390 Urinary tract infection, site not specified: Secondary | ICD-10-CM

## 2020-02-18 DIAGNOSIS — B9689 Other specified bacterial agents as the cause of diseases classified elsewhere: Secondary | ICD-10-CM

## 2020-02-18 MED ORDER — CEFTRIAXONE SODIUM 1 G IJ SOLR: 500.0000 mg | Freq: Every day | INTRAMUSCULAR | 0 refills | Status: DC

## 2020-02-18 NOTE — Progress Notes (Unsigned)
Ordered HH: ceftriaxone 500mg  daily x 5 days.  PT/INR on day 3 of abx.   F/u if not improving.

## 2020-02-18 NOTE — Telephone Encounter (Signed)
Pharmacy refusing to fill Rocephin script. I have contacted home health agency to see if they are able to administer medication that our office provides. Awaiting a call back from care coordinator from [facility].

## 2020-02-18 NOTE — Progress Notes (Signed)
Orders faxed to [facility]

## 2020-02-18 NOTE — Telephone Encounter (Signed)
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 This nurse attempted to return pt's call X2 and LVM for pt to return call.

## 2020-02-19 ENCOUNTER — Inpatient Hospital Stay: Payer: Medicare Other

## 2020-02-19 ENCOUNTER — Ambulatory Visit: Payer: Medicare Other

## 2020-02-19 ENCOUNTER — Inpatient Hospital Stay: Payer: Medicare Other | Attending: Oncology

## 2020-02-19 ENCOUNTER — Other Ambulatory Visit: Payer: Self-pay

## 2020-02-19 VITALS — BP 151/70 | HR 78 | Temp 98.3°F | Resp 18

## 2020-02-19 DIAGNOSIS — Z7901 Long term (current) use of anticoagulants: Secondary | ICD-10-CM | POA: Insufficient documentation

## 2020-02-19 DIAGNOSIS — C7951 Secondary malignant neoplasm of bone: Secondary | ICD-10-CM

## 2020-02-19 DIAGNOSIS — Z5111 Encounter for antineoplastic chemotherapy: Secondary | ICD-10-CM | POA: Insufficient documentation

## 2020-02-19 DIAGNOSIS — Z86718 Personal history of other venous thrombosis and embolism: Secondary | ICD-10-CM | POA: Insufficient documentation

## 2020-02-19 DIAGNOSIS — Z170 Estrogen receptor positive status [ER+]: Secondary | ICD-10-CM | POA: Diagnosis not present

## 2020-02-19 DIAGNOSIS — Z923 Personal history of irradiation: Secondary | ICD-10-CM | POA: Insufficient documentation

## 2020-02-19 DIAGNOSIS — M84459A Pathological fracture, hip, unspecified, initial encounter for fracture: Secondary | ICD-10-CM

## 2020-02-19 DIAGNOSIS — E039 Hypothyroidism, unspecified: Secondary | ICD-10-CM | POA: Diagnosis not present

## 2020-02-19 DIAGNOSIS — M84659P Pathological fracture in other disease, hip, unspecified, subsequent encounter for fracture with malunion: Secondary | ICD-10-CM

## 2020-02-19 DIAGNOSIS — Z79899 Other long term (current) drug therapy: Secondary | ICD-10-CM | POA: Insufficient documentation

## 2020-02-19 DIAGNOSIS — I10 Essential (primary) hypertension: Secondary | ICD-10-CM | POA: Insufficient documentation

## 2020-02-19 DIAGNOSIS — D6859 Other primary thrombophilia: Secondary | ICD-10-CM

## 2020-02-19 DIAGNOSIS — C50411 Malignant neoplasm of upper-outer quadrant of right female breast: Secondary | ICD-10-CM

## 2020-02-19 DIAGNOSIS — M8580 Other specified disorders of bone density and structure, unspecified site: Secondary | ICD-10-CM

## 2020-02-19 DIAGNOSIS — I82401 Acute embolism and thrombosis of unspecified deep veins of right lower extremity: Secondary | ICD-10-CM

## 2020-02-19 DIAGNOSIS — I700 Atherosclerosis of aorta: Secondary | ICD-10-CM

## 2020-02-19 DIAGNOSIS — M899 Disorder of bone, unspecified: Secondary | ICD-10-CM

## 2020-02-19 LAB — CBC WITH DIFFERENTIAL/PLATELET
Abs Immature Granulocytes: 0.03 10*3/uL (ref 0.00–0.07)
Basophils Absolute: 0.1 10*3/uL (ref 0.0–0.1)
Basophils Relative: 1 %
Eosinophils Absolute: 0.3 10*3/uL (ref 0.0–0.5)
Eosinophils Relative: 3 %
HCT: 41.6 % (ref 36.0–46.0)
Hemoglobin: 13.3 g/dL (ref 12.0–15.0)
Immature Granulocytes: 0 %
Lymphocytes Relative: 15 %
Lymphs Abs: 1.3 10*3/uL (ref 0.7–4.0)
MCH: 29 pg (ref 26.0–34.0)
MCHC: 32 g/dL (ref 30.0–36.0)
MCV: 90.6 fL (ref 80.0–100.0)
Monocytes Absolute: 1.2 10*3/uL — ABNORMAL HIGH (ref 0.1–1.0)
Monocytes Relative: 14 %
Neutro Abs: 5.9 10*3/uL (ref 1.7–7.7)
Neutrophils Relative %: 67 %
Platelets: 257 10*3/uL (ref 150–400)
RBC: 4.59 MIL/uL (ref 3.87–5.11)
RDW: 14.9 % (ref 11.5–15.5)
WBC: 8.8 10*3/uL (ref 4.0–10.5)
nRBC: 0 % (ref 0.0–0.2)

## 2020-02-19 LAB — PROTIME-INR
INR: 1.9 — ABNORMAL HIGH (ref 0.8–1.2)
Prothrombin Time: 20.9 seconds — ABNORMAL HIGH (ref 11.4–15.2)

## 2020-02-19 MED ORDER — FULVESTRANT 250 MG/5ML IM SOLN
500.0000 mg | Freq: Once | INTRAMUSCULAR | Status: AC
  Administered 2020-02-19: 500 mg via INTRAMUSCULAR

## 2020-02-19 MED ORDER — FULVESTRANT 250 MG/5ML IM SOLN
INTRAMUSCULAR | Status: AC
  Filled 2020-02-19: qty 10

## 2020-02-19 NOTE — Patient Instructions (Signed)
Fulvestrant injection What is this medicine? FULVESTRANT (ful VES trant) blocks the effects of estrogen. It is used to treat breast cancer. This medicine may be used for other purposes; ask your health care provider or pharmacist if you have questions. COMMON BRAND NAME(S): FASLODEX What should I tell my health care provider before I take this medicine? They need to know if you have any of these conditions:  bleeding disorders  liver disease  low blood counts, like low white cell, platelet, or red cell counts  an unusual or allergic reaction to fulvestrant, other medicines, foods, dyes, or preservatives  pregnant or trying to get pregnant  breast-feeding How should I use this medicine? This medicine is for injection into a muscle. It is usually given by a health care professional in a hospital or clinic setting. Talk to your pediatrician regarding the use of this medicine in children. Special care may be needed. Overdosage: If you think you have taken too much of this medicine contact a poison control center or emergency room at once. NOTE: This medicine is only for you. Do not share this medicine with others. What if I miss a dose? It is important not to miss your dose. Call your doctor or health care professional if you are unable to keep an appointment. What may interact with this medicine?  medicines that treat or prevent blood clots like warfarin, enoxaparin, dalteparin, apixaban, dabigatran, and rivaroxaban This list may not describe all possible interactions. Give your health care provider a list of all the medicines, herbs, non-prescription drugs, or dietary supplements you use. Also tell them if you smoke, drink alcohol, or use illegal drugs. Some items may interact with your medicine. What should I watch for while using this medicine? Your condition will be monitored carefully while you are receiving this medicine. You will need important blood work done while you are taking  this medicine. Do not become pregnant while taking this medicine or for at least 1 year after stopping it. Women of child-bearing potential will need to have a negative pregnancy test before starting this medicine. Women should inform their doctor if they wish to become pregnant or think they might be pregnant. There is a potential for serious side effects to an unborn child. Men should inform their doctors if they wish to father a child. This medicine may lower sperm counts. Talk to your health care professional or pharmacist for more information. Do not breast-feed an infant while taking this medicine or for 1 year after the last dose. What side effects may I notice from receiving this medicine? Side effects that you should report to your doctor or health care professional as soon as possible:  allergic reactions like skin rash, itching or hives, swelling of the face, lips, or tongue  feeling faint or lightheaded, falls  pain, tingling, numbness, or weakness in the legs  signs and symptoms of infection like fever or chills; cough; flu-like symptoms; sore throat  vaginal bleeding Side effects that usually do not require medical attention (report to your doctor or health care professional if they continue or are bothersome):  aches, pains  constipation  diarrhea  headache  hot flashes  nausea, vomiting  pain at site where injected  stomach pain This list may not describe all possible side effects. Call your doctor for medical advice about side effects. You may report side effects to FDA at 1-800-FDA-1088. Where should I keep my medicine? This drug is given in a hospital or clinic and will   not be stored at home. NOTE: This sheet is a summary. It may not cover all possible information. If you have questions about this medicine, talk to your doctor, pharmacist, or health care provider.  2020 Elsevier/Gold Standard (2017-08-24 11:34:41)  

## 2020-02-20 ENCOUNTER — Other Ambulatory Visit: Payer: Self-pay

## 2020-02-20 ENCOUNTER — Ambulatory Visit (INDEPENDENT_AMBULATORY_CARE_PROVIDER_SITE_OTHER): Payer: Medicare Other

## 2020-02-20 DIAGNOSIS — N390 Urinary tract infection, site not specified: Secondary | ICD-10-CM

## 2020-02-20 DIAGNOSIS — B9689 Other specified bacterial agents as the cause of diseases classified elsewhere: Secondary | ICD-10-CM | POA: Diagnosis not present

## 2020-02-20 LAB — CANCER ANTIGEN 27.29: CA 27.29: 40.9 U/mL — ABNORMAL HIGH (ref 0.0–38.6)

## 2020-02-20 MED ORDER — CEFTRIAXONE SODIUM 1 G IJ SOLR: 500.0000 mg | Freq: Every day | INTRAMUSCULAR | 0 refills | Status: AC

## 2020-02-20 MED ORDER — LIDOCAINE HCL (PF) 1 % IJ SOLN: 30.0000 mL | Freq: Once | INTRAMUSCULAR | 0 refills | Status: AC

## 2020-02-20 MED ORDER — SYRINGE (DISPOSABLE) 5 ML MISC: 0 refills | Status: DC

## 2020-02-20 MED ORDER — CEFTRIAXONE SODIUM 500 MG IJ SOLR
500.0000 mg | Freq: Once | INTRAMUSCULAR | Status: AC
  Administered 2020-02-20: 500 mg via INTRAMUSCULAR

## 2020-02-20 MED ORDER — "NEEDLE (DISP) 25G X 1-1/2"" MISC": 0 refills | Status: DC

## 2020-02-20 MED FILL — BD NEEDLES 25GX1.5: 25G X 1-1/2 | 5 days supply | Qty: 5 | Fill #0

## 2020-02-20 MED FILL — BD TB SYRINGE 27GX1/2: 27G X 1/2" | 5 days supply | Qty: 5 | Fill #0

## 2020-02-20 MED FILL — LIDOCAINE HCL 1% VIAL: 1 | 5 days supply | Qty: 10 | Fill #0

## 2020-02-20 NOTE — Telephone Encounter (Signed)
No entry 

## 2020-02-20 NOTE — Progress Notes (Signed)
Alicia Vasquez 79 y.o. female presents to office today for antibiotic injection per Billey Chang, MD. Administered CEFTRIAXONE 500 mg right ventrogluteal. Patient tolerated well.

## 2020-02-21 MED FILL — CEFTRIAXONE 500 MG VIAL: 500 | 5 days supply | Qty: 5 | Fill #0

## 2020-02-23 DIAGNOSIS — Z86718 Personal history of other venous thrombosis and embolism: Secondary | ICD-10-CM | POA: Diagnosis not present

## 2020-02-23 DIAGNOSIS — E213 Hyperparathyroidism, unspecified: Secondary | ICD-10-CM | POA: Diagnosis not present

## 2020-02-23 DIAGNOSIS — Z170 Estrogen receptor positive status [ER+]: Secondary | ICD-10-CM | POA: Diagnosis not present

## 2020-02-23 DIAGNOSIS — M25561 Pain in right knee: Secondary | ICD-10-CM | POA: Diagnosis not present

## 2020-02-23 DIAGNOSIS — N390 Urinary tract infection, site not specified: Secondary | ICD-10-CM | POA: Diagnosis not present

## 2020-02-23 DIAGNOSIS — I10 Essential (primary) hypertension: Secondary | ICD-10-CM | POA: Diagnosis not present

## 2020-02-23 DIAGNOSIS — E039 Hypothyroidism, unspecified: Secondary | ICD-10-CM | POA: Diagnosis not present

## 2020-02-23 DIAGNOSIS — Z7901 Long term (current) use of anticoagulants: Secondary | ICD-10-CM | POA: Diagnosis not present

## 2020-02-23 DIAGNOSIS — I890 Lymphedema, not elsewhere classified: Secondary | ICD-10-CM | POA: Diagnosis not present

## 2020-02-23 DIAGNOSIS — Z6841 Body Mass Index (BMI) 40.0 and over, adult: Secondary | ICD-10-CM | POA: Diagnosis not present

## 2020-02-23 DIAGNOSIS — G893 Neoplasm related pain (acute) (chronic): Secondary | ICD-10-CM | POA: Diagnosis not present

## 2020-02-23 DIAGNOSIS — C7951 Secondary malignant neoplasm of bone: Secondary | ICD-10-CM | POA: Diagnosis not present

## 2020-02-23 DIAGNOSIS — I700 Atherosclerosis of aorta: Secondary | ICD-10-CM | POA: Diagnosis not present

## 2020-02-23 DIAGNOSIS — H9190 Unspecified hearing loss, unspecified ear: Secondary | ICD-10-CM | POA: Diagnosis not present

## 2020-02-23 DIAGNOSIS — M25562 Pain in left knee: Secondary | ICD-10-CM | POA: Diagnosis not present

## 2020-02-23 DIAGNOSIS — C50411 Malignant neoplasm of upper-outer quadrant of right female breast: Secondary | ICD-10-CM | POA: Diagnosis not present

## 2020-02-23 DIAGNOSIS — F329 Major depressive disorder, single episode, unspecified: Secondary | ICD-10-CM | POA: Diagnosis not present

## 2020-02-23 DIAGNOSIS — B373 Candidiasis of vulva and vagina: Secondary | ICD-10-CM | POA: Diagnosis not present

## 2020-02-24 ENCOUNTER — Telehealth: Payer: Self-pay | Admitting: General Practice

## 2020-02-24 DIAGNOSIS — B373 Candidiasis of vulva and vagina: Secondary | ICD-10-CM | POA: Diagnosis not present

## 2020-02-24 DIAGNOSIS — I890 Lymphedema, not elsewhere classified: Secondary | ICD-10-CM | POA: Diagnosis not present

## 2020-02-24 DIAGNOSIS — N390 Urinary tract infection, site not specified: Secondary | ICD-10-CM | POA: Diagnosis not present

## 2020-02-24 DIAGNOSIS — C50411 Malignant neoplasm of upper-outer quadrant of right female breast: Secondary | ICD-10-CM | POA: Diagnosis not present

## 2020-02-24 DIAGNOSIS — C7951 Secondary malignant neoplasm of bone: Secondary | ICD-10-CM | POA: Diagnosis not present

## 2020-02-24 DIAGNOSIS — G893 Neoplasm related pain (acute) (chronic): Secondary | ICD-10-CM | POA: Diagnosis not present

## 2020-02-24 NOTE — Telephone Encounter (Signed)
Pt called stating she was given an antibiotic shot on Thursday. She was supposed to get one from [facility] over the weekend but they never came to visit her. Pt states she has reached out to home health multiple times. They first told her that she was not on the schedule to be seen. When she called today they told her they would call her tomorrow morning. Please advise.

## 2020-02-24 NOTE — Telephone Encounter (Signed)
I just spoke with [facility], states patient had a nurse visit yesterday and received a shot and is scheduled for the next several days. Also aware to test PT/INR on 3rd dose.

## 2020-02-25 DIAGNOSIS — G893 Neoplasm related pain (acute) (chronic): Secondary | ICD-10-CM | POA: Diagnosis not present

## 2020-02-25 DIAGNOSIS — C7951 Secondary malignant neoplasm of bone: Secondary | ICD-10-CM | POA: Diagnosis not present

## 2020-02-25 DIAGNOSIS — I890 Lymphedema, not elsewhere classified: Secondary | ICD-10-CM | POA: Diagnosis not present

## 2020-02-25 DIAGNOSIS — B373 Candidiasis of vulva and vagina: Secondary | ICD-10-CM | POA: Diagnosis not present

## 2020-02-25 DIAGNOSIS — C50411 Malignant neoplasm of upper-outer quadrant of right female breast: Secondary | ICD-10-CM | POA: Diagnosis not present

## 2020-02-25 DIAGNOSIS — N390 Urinary tract infection, site not specified: Secondary | ICD-10-CM | POA: Diagnosis not present

## 2020-02-26 DIAGNOSIS — N390 Urinary tract infection, site not specified: Secondary | ICD-10-CM | POA: Diagnosis not present

## 2020-02-26 DIAGNOSIS — C7951 Secondary malignant neoplasm of bone: Secondary | ICD-10-CM | POA: Diagnosis not present

## 2020-02-26 DIAGNOSIS — G893 Neoplasm related pain (acute) (chronic): Secondary | ICD-10-CM | POA: Diagnosis not present

## 2020-02-26 DIAGNOSIS — B373 Candidiasis of vulva and vagina: Secondary | ICD-10-CM | POA: Diagnosis not present

## 2020-02-26 DIAGNOSIS — C50411 Malignant neoplasm of upper-outer quadrant of right female breast: Secondary | ICD-10-CM | POA: Diagnosis not present

## 2020-02-26 DIAGNOSIS — I890 Lymphedema, not elsewhere classified: Secondary | ICD-10-CM | POA: Diagnosis not present

## 2020-02-27 ENCOUNTER — Telehealth: Payer: Self-pay

## 2020-02-27 ENCOUNTER — Encounter: Payer: Self-pay | Admitting: Family Medicine

## 2020-02-27 DIAGNOSIS — C7951 Secondary malignant neoplasm of bone: Secondary | ICD-10-CM | POA: Diagnosis not present

## 2020-02-27 DIAGNOSIS — R791 Abnormal coagulation profile: Secondary | ICD-10-CM | POA: Diagnosis not present

## 2020-02-27 DIAGNOSIS — N390 Urinary tract infection, site not specified: Secondary | ICD-10-CM | POA: Diagnosis not present

## 2020-02-27 DIAGNOSIS — C50411 Malignant neoplasm of upper-outer quadrant of right female breast: Secondary | ICD-10-CM | POA: Diagnosis not present

## 2020-02-27 DIAGNOSIS — B373 Candidiasis of vulva and vagina: Secondary | ICD-10-CM | POA: Diagnosis not present

## 2020-02-27 DIAGNOSIS — I890 Lymphedema, not elsewhere classified: Secondary | ICD-10-CM | POA: Diagnosis not present

## 2020-02-27 DIAGNOSIS — G893 Neoplasm related pain (acute) (chronic): Secondary | ICD-10-CM | POA: Diagnosis not present

## 2020-02-27 LAB — POCT INR: INR: 1 — AB (ref 2.0–3.0)

## 2020-02-27 NOTE — Telephone Encounter (Signed)
Alicia Vasquez on behalf of Patient called to Inform that patient has taken last dose of rochephin, and it still having burning sensation while urinating. Also patient had stopped taking warfarin (COUMADIN) 5 MG tablet because she was told to stop, but cannot remember by who ?

## 2020-02-28 ENCOUNTER — Telehealth: Payer: Self-pay | Admitting: *Deleted

## 2020-02-28 NOTE — Telephone Encounter (Signed)
VM received left at 415 pm " this is for [facility] - her INR is 1 and she needs to know how to resume her coumadin "  Per chart pt had lab on 9/22 showing INR of 1.9.  2nd call received - note pt has direct nurse line- from [employer] .  She states INR is being done by the Home Health nurse " and because of the injections her INR was too high and the nurse said to hold it but now it's too low and we can't wait until Monday "  This RN informed [name] that we do not have the lab results per above- as well as her care with home health is not being directed by this office.  [name] states the primary MD stated need to call Dr Jana Hakim about the INR.  This RN informed her presently provider is not in the office- " I know I am trying to get a hold of the on call doctor "  This RN reiterated the number she is calling is for non urgent calls and directed her to the [phone].

## 2020-02-28 NOTE — Telephone Encounter (Signed)
Spoke with patient's roommate, Alicia Vasquez, who I informed to have Alicia Vasquez to call me back. Per Dr. Jonni Vasquez - she did not instruct patient to stop taking her coumadin, she needs to contact the office of the provider who prescribes coumadin. Dr. Jonni Vasquez recommends a repeat urine culture either through home health - which I can order or with oncology at one of her follow ups.

## 2020-02-28 NOTE — Telephone Encounter (Signed)
Spoke with patient, states that one of the home health nurses told her it was dangerous to be on Coumadin and Rocephin at the same time, so patient decided to completely stop taking Coumadin.  Advised patient to reach out to oncologist who is managing her coumadin IMMEDIATELY to get medication restarted. Patient did not mention any burning sensations while talking to me. She did become highly upset and passed the phone off to her friend, [name].  [name] states that she advised patient that Dr. Jonni Sanger never told her to stop taking coumadin and that it was suggested by [facility] nurse.

## 2020-03-02 ENCOUNTER — Telehealth: Payer: Self-pay | Admitting: *Deleted

## 2020-03-02 ENCOUNTER — Other Ambulatory Visit: Payer: Self-pay | Admitting: Oncology

## 2020-03-02 DIAGNOSIS — Z86711 Personal history of pulmonary embolism: Secondary | ICD-10-CM

## 2020-03-02 MED ORDER — WARFARIN SODIUM 5 MG PO TABS: ORAL_TABLET | ORAL | 1 refills | Status: DC

## 2020-03-02 NOTE — Telephone Encounter (Signed)
This RN received communication from on call- [facility]- that pt spoke with on call MD 10/1 with recommendation to resume coumadin at 10 mg daily with recheck in 1 week.  This RN called pt and obtained a VM- message left stating request for appointment for lab per above recommendation.  Request for appt sent to scheduling per above.

## 2020-03-03 ENCOUNTER — Telehealth: Payer: Self-pay | Admitting: Oncology

## 2020-03-03 DIAGNOSIS — G893 Neoplasm related pain (acute) (chronic): Secondary | ICD-10-CM | POA: Diagnosis not present

## 2020-03-03 DIAGNOSIS — C50411 Malignant neoplasm of upper-outer quadrant of right female breast: Secondary | ICD-10-CM | POA: Diagnosis not present

## 2020-03-03 DIAGNOSIS — N390 Urinary tract infection, site not specified: Secondary | ICD-10-CM | POA: Diagnosis not present

## 2020-03-03 DIAGNOSIS — C7951 Secondary malignant neoplasm of bone: Secondary | ICD-10-CM | POA: Diagnosis not present

## 2020-03-03 DIAGNOSIS — B373 Candidiasis of vulva and vagina: Secondary | ICD-10-CM | POA: Diagnosis not present

## 2020-03-03 DIAGNOSIS — I890 Lymphedema, not elsewhere classified: Secondary | ICD-10-CM | POA: Diagnosis not present

## 2020-03-03 NOTE — Telephone Encounter (Signed)
Scheduled appt per 10/4 [facility] msg - no answer. Left message for patient with appt date and time

## 2020-03-05 ENCOUNTER — Other Ambulatory Visit: Payer: Self-pay

## 2020-03-05 ENCOUNTER — Inpatient Hospital Stay: Payer: Medicare Other | Attending: Oncology

## 2020-03-05 ENCOUNTER — Telehealth: Payer: Self-pay | Admitting: *Deleted

## 2020-03-05 DIAGNOSIS — M84459A Pathological fracture, hip, unspecified, initial encounter for fracture: Secondary | ICD-10-CM

## 2020-03-05 DIAGNOSIS — Z86718 Personal history of other venous thrombosis and embolism: Secondary | ICD-10-CM | POA: Insufficient documentation

## 2020-03-05 DIAGNOSIS — D6859 Other primary thrombophilia: Secondary | ICD-10-CM

## 2020-03-05 DIAGNOSIS — I82401 Acute embolism and thrombosis of unspecified deep veins of right lower extremity: Secondary | ICD-10-CM

## 2020-03-05 DIAGNOSIS — C50411 Malignant neoplasm of upper-outer quadrant of right female breast: Secondary | ICD-10-CM | POA: Insufficient documentation

## 2020-03-05 DIAGNOSIS — M8580 Other specified disorders of bone density and structure, unspecified site: Secondary | ICD-10-CM

## 2020-03-05 DIAGNOSIS — Z7901 Long term (current) use of anticoagulants: Secondary | ICD-10-CM | POA: Diagnosis not present

## 2020-03-05 DIAGNOSIS — C7951 Secondary malignant neoplasm of bone: Secondary | ICD-10-CM | POA: Diagnosis not present

## 2020-03-05 DIAGNOSIS — I700 Atherosclerosis of aorta: Secondary | ICD-10-CM

## 2020-03-05 DIAGNOSIS — Z170 Estrogen receptor positive status [ER+]: Secondary | ICD-10-CM | POA: Insufficient documentation

## 2020-03-05 DIAGNOSIS — Z79818 Long term (current) use of other agents affecting estrogen receptors and estrogen levels: Secondary | ICD-10-CM | POA: Diagnosis not present

## 2020-03-05 LAB — CBC WITH DIFFERENTIAL/PLATELET
Abs Immature Granulocytes: 0.03 10*3/uL (ref 0.00–0.07)
Basophils Absolute: 0.1 10*3/uL (ref 0.0–0.1)
Basophils Relative: 1 %
Eosinophils Absolute: 0.5 10*3/uL (ref 0.0–0.5)
Eosinophils Relative: 6 %
HCT: 41.3 % (ref 36.0–46.0)
Hemoglobin: 13.4 g/dL (ref 12.0–15.0)
Immature Granulocytes: 0 %
Lymphocytes Relative: 16 %
Lymphs Abs: 1.1 10*3/uL (ref 0.7–4.0)
MCH: 29.5 pg (ref 26.0–34.0)
MCHC: 32.4 g/dL (ref 30.0–36.0)
MCV: 90.8 fL (ref 80.0–100.0)
Monocytes Absolute: 0.9 10*3/uL (ref 0.1–1.0)
Monocytes Relative: 12 %
Neutro Abs: 4.8 10*3/uL (ref 1.7–7.7)
Neutrophils Relative %: 65 %
Platelets: 229 10*3/uL (ref 150–400)
RBC: 4.55 MIL/uL (ref 3.87–5.11)
RDW: 14.9 % (ref 11.5–15.5)
WBC: 7.4 10*3/uL (ref 4.0–10.5)
nRBC: 0 % (ref 0.0–0.2)

## 2020-03-05 LAB — PROTIME-INR
INR: 1.7 — ABNORMAL HIGH (ref 0.8–1.2)
Prothrombin Time: 19.4 seconds — ABNORMAL HIGH (ref 11.4–15.2)

## 2020-03-05 NOTE — Telephone Encounter (Signed)
Received message from [facility] lab that pt collected a urine sample but no orders seen.  Called pt & left message to let us know why she collected sample.

## 2020-03-06 ENCOUNTER — Telehealth: Payer: Self-pay

## 2020-03-06 DIAGNOSIS — Z86711 Personal history of pulmonary embolism: Secondary | ICD-10-CM

## 2020-03-06 MED ORDER — WARFARIN SODIUM 5 MG PO TABS: ORAL_TABLET | ORAL | 1 refills | Status: DC

## 2020-03-06 NOTE — Telephone Encounter (Signed)
Pt notified of INR results - Per MD recommendations pt to change Warfarin dose to 10mg  daily, and recheck in 2 weeks.    RN notified patient, verbalized understanding.

## 2020-03-07 LAB — CANCER ANTIGEN 27.29: CA 27.29: 44.3 U/mL — ABNORMAL HIGH (ref 0.0–38.6)

## 2020-03-09 ENCOUNTER — Telehealth: Payer: Self-pay

## 2020-03-09 NOTE — Telephone Encounter (Signed)
Pt called to request refill on medication, Hydrocodone.    Pt with chronic hip pain.    Request for refill send to MD for approval. Pt aware.

## 2020-03-10 ENCOUNTER — Other Ambulatory Visit: Payer: Self-pay | Admitting: Oncology

## 2020-03-10 DIAGNOSIS — M84559P Pathological fracture in neoplastic disease, hip, unspecified, subsequent encounter for fracture with malunion: Secondary | ICD-10-CM

## 2020-03-10 MED ORDER — HYDROCODONE-ACETAMINOPHEN 5-325 MG PO TABS: 1.0000 | ORAL_TABLET | Freq: Four times a day (QID) | ORAL | 0 refills | Status: DC | PRN

## 2020-03-12 DIAGNOSIS — N390 Urinary tract infection, site not specified: Secondary | ICD-10-CM | POA: Diagnosis not present

## 2020-03-12 DIAGNOSIS — B373 Candidiasis of vulva and vagina: Secondary | ICD-10-CM | POA: Diagnosis not present

## 2020-03-12 DIAGNOSIS — C7951 Secondary malignant neoplasm of bone: Secondary | ICD-10-CM | POA: Diagnosis not present

## 2020-03-12 DIAGNOSIS — C50411 Malignant neoplasm of upper-outer quadrant of right female breast: Secondary | ICD-10-CM | POA: Diagnosis not present

## 2020-03-12 DIAGNOSIS — I890 Lymphedema, not elsewhere classified: Secondary | ICD-10-CM | POA: Diagnosis not present

## 2020-03-12 DIAGNOSIS — G893 Neoplasm related pain (acute) (chronic): Secondary | ICD-10-CM | POA: Diagnosis not present

## 2020-03-17 ENCOUNTER — Telehealth: Payer: Self-pay

## 2020-03-17 NOTE — Telephone Encounter (Signed)
Pt wants a test ordered from home health to see if she still has her UTI.   She also mentioned that she had a call this morning from our office this morning saying we sent a letter. Pt states she has not received that letter.

## 2020-03-18 ENCOUNTER — Inpatient Hospital Stay: Payer: Medicare Other

## 2020-03-18 ENCOUNTER — Other Ambulatory Visit: Payer: Self-pay

## 2020-03-18 VITALS — BP 143/73 | HR 77 | Temp 98.1°F | Resp 18

## 2020-03-18 DIAGNOSIS — Z170 Estrogen receptor positive status [ER+]: Secondary | ICD-10-CM | POA: Diagnosis not present

## 2020-03-18 DIAGNOSIS — Z7901 Long term (current) use of anticoagulants: Secondary | ICD-10-CM | POA: Diagnosis not present

## 2020-03-18 DIAGNOSIS — C7951 Secondary malignant neoplasm of bone: Secondary | ICD-10-CM | POA: Diagnosis not present

## 2020-03-18 DIAGNOSIS — D6859 Other primary thrombophilia: Secondary | ICD-10-CM

## 2020-03-18 DIAGNOSIS — M899 Disorder of bone, unspecified: Secondary | ICD-10-CM

## 2020-03-18 DIAGNOSIS — M898X5 Other specified disorders of bone, thigh: Secondary | ICD-10-CM

## 2020-03-18 DIAGNOSIS — Z79818 Long term (current) use of other agents affecting estrogen receptors and estrogen levels: Secondary | ICD-10-CM | POA: Diagnosis not present

## 2020-03-18 DIAGNOSIS — Z86718 Personal history of other venous thrombosis and embolism: Secondary | ICD-10-CM | POA: Diagnosis not present

## 2020-03-18 DIAGNOSIS — C50411 Malignant neoplasm of upper-outer quadrant of right female breast: Secondary | ICD-10-CM

## 2020-03-18 DIAGNOSIS — M8580 Other specified disorders of bone density and structure, unspecified site: Secondary | ICD-10-CM

## 2020-03-18 DIAGNOSIS — I700 Atherosclerosis of aorta: Secondary | ICD-10-CM

## 2020-03-18 DIAGNOSIS — M84659P Pathological fracture in other disease, hip, unspecified, subsequent encounter for fracture with malunion: Secondary | ICD-10-CM

## 2020-03-18 DIAGNOSIS — I82401 Acute embolism and thrombosis of unspecified deep veins of right lower extremity: Secondary | ICD-10-CM

## 2020-03-18 DIAGNOSIS — M84459A Pathological fracture, hip, unspecified, initial encounter for fracture: Secondary | ICD-10-CM

## 2020-03-18 LAB — CBC WITH DIFFERENTIAL/PLATELET
Abs Immature Granulocytes: 0.04 10*3/uL (ref 0.00–0.07)
Basophils Absolute: 0.1 10*3/uL (ref 0.0–0.1)
Basophils Relative: 1 %
Eosinophils Absolute: 0.3 10*3/uL (ref 0.0–0.5)
Eosinophils Relative: 4 %
HCT: 41.4 % (ref 36.0–46.0)
Hemoglobin: 13.3 g/dL (ref 12.0–15.0)
Immature Granulocytes: 1 %
Lymphocytes Relative: 15 %
Lymphs Abs: 1.3 10*3/uL (ref 0.7–4.0)
MCH: 28.8 pg (ref 26.0–34.0)
MCHC: 32.1 g/dL (ref 30.0–36.0)
MCV: 89.6 fL (ref 80.0–100.0)
Monocytes Absolute: 1.1 10*3/uL — ABNORMAL HIGH (ref 0.1–1.0)
Monocytes Relative: 13 %
Neutro Abs: 5.5 10*3/uL (ref 1.7–7.7)
Neutrophils Relative %: 66 %
Platelets: 274 10*3/uL (ref 150–400)
RBC: 4.62 MIL/uL (ref 3.87–5.11)
RDW: 14.6 % (ref 11.5–15.5)
WBC: 8.2 10*3/uL (ref 4.0–10.5)
nRBC: 0 % (ref 0.0–0.2)

## 2020-03-18 LAB — PROTIME-INR
INR: 3.8 — ABNORMAL HIGH (ref 0.8–1.2)
Prothrombin Time: 36.1 seconds — ABNORMAL HIGH (ref 11.4–15.2)

## 2020-03-18 MED ORDER — FULVESTRANT 250 MG/5ML IM SOLN
500.0000 mg | Freq: Once | INTRAMUSCULAR | Status: AC
  Administered 2020-03-18: 500 mg via INTRAMUSCULAR

## 2020-03-18 MED ORDER — FULVESTRANT 250 MG/5ML IM SOLN
INTRAMUSCULAR | Status: AC
  Filled 2020-03-18: qty 5

## 2020-03-18 NOTE — Telephone Encounter (Signed)
Would recommend OV with Dr. Jerline Pain if she is still having symptoms.

## 2020-03-18 NOTE — Telephone Encounter (Signed)
FYI

## 2020-03-18 NOTE — Patient Instructions (Signed)
Fulvestrant injection What is this medicine? FULVESTRANT (ful VES trant) blocks the effects of estrogen. It is used to treat breast cancer. This medicine may be used for other purposes; ask your health care provider or pharmacist if you have questions. COMMON BRAND NAME(S): FASLODEX What should I tell my health care provider before I take this medicine? They need to know if you have any of these conditions:  bleeding disorders  liver disease  low blood counts, like low white cell, platelet, or red cell counts  an unusual or allergic reaction to fulvestrant, other medicines, foods, dyes, or preservatives  pregnant or trying to get pregnant  breast-feeding How should I use this medicine? This medicine is for injection into a muscle. It is usually given by a health care professional in a hospital or clinic setting. Talk to your pediatrician regarding the use of this medicine in children. Special care may be needed. Overdosage: If you think you have taken too much of this medicine contact a poison control center or emergency room at once. NOTE: This medicine is only for you. Do not share this medicine with others. What if I miss a dose? It is important not to miss your dose. Call your doctor or health care professional if you are unable to keep an appointment. What may interact with this medicine?  medicines that treat or prevent blood clots like warfarin, enoxaparin, dalteparin, apixaban, dabigatran, and rivaroxaban This list may not describe all possible interactions. Give your health care provider a list of all the medicines, herbs, non-prescription drugs, or dietary supplements you use. Also tell them if you smoke, drink alcohol, or use illegal drugs. Some items may interact with your medicine. What should I watch for while using this medicine? Your condition will be monitored carefully while you are receiving this medicine. You will need important blood work done while you are taking  this medicine. Do not become pregnant while taking this medicine or for at least 1 year after stopping it. Women of child-bearing potential will need to have a negative pregnancy test before starting this medicine. Women should inform their doctor if they wish to become pregnant or think they might be pregnant. There is a potential for serious side effects to an unborn child. Men should inform their doctors if they wish to father a child. This medicine may lower sperm counts. Talk to your health care professional or pharmacist for more information. Do not breast-feed an infant while taking this medicine or for 1 year after the last dose. What side effects may I notice from receiving this medicine? Side effects that you should report to your doctor or health care professional as soon as possible:  allergic reactions like skin rash, itching or hives, swelling of the face, lips, or tongue  feeling faint or lightheaded, falls  pain, tingling, numbness, or weakness in the legs  signs and symptoms of infection like fever or chills; cough; flu-like symptoms; sore throat  vaginal bleeding Side effects that usually do not require medical attention (report to your doctor or health care professional if they continue or are bothersome):  aches, pains  constipation  diarrhea  headache  hot flashes  nausea, vomiting  pain at site where injected  stomach pain This list may not describe all possible side effects. Call your doctor for medical advice about side effects. You may report side effects to FDA at 1-800-FDA-1088. Where should I keep my medicine? This drug is given in a hospital or clinic and will   not be stored at home. NOTE: This sheet is a summary. It may not cover all possible information. If you have questions about this medicine, talk to your doctor, pharmacist, or health care provider.  2020 Elsevier/Gold Standard (2017-08-24 11:34:41)  

## 2020-03-19 LAB — CANCER ANTIGEN 27.29: CA 27.29: 36.2 U/mL (ref 0.0–38.6)

## 2020-03-19 NOTE — Telephone Encounter (Signed)
LVM asking patient to call back.  

## 2020-03-20 ENCOUNTER — Ambulatory Visit: Payer: Medicare Other | Admitting: Podiatry

## 2020-03-23 ENCOUNTER — Telehealth: Payer: Self-pay

## 2020-03-23 NOTE — Telephone Encounter (Signed)
Per MD recommendations patient to change dose of Coumadin to 7.5 mg daily - Encourage compliance of daily medication.   RN contact patient, voicemail box full.  Will continue to contact patient.

## 2020-03-23 NOTE — Telephone Encounter (Signed)
RN returned call, voicemail left for return call.  MO:QHUT

## 2020-03-24 ENCOUNTER — Other Ambulatory Visit: Payer: Self-pay

## 2020-03-24 DIAGNOSIS — E039 Hypothyroidism, unspecified: Secondary | ICD-10-CM | POA: Diagnosis not present

## 2020-03-24 DIAGNOSIS — I700 Atherosclerosis of aorta: Secondary | ICD-10-CM | POA: Diagnosis not present

## 2020-03-24 DIAGNOSIS — F329 Major depressive disorder, single episode, unspecified: Secondary | ICD-10-CM | POA: Diagnosis not present

## 2020-03-24 DIAGNOSIS — C7951 Secondary malignant neoplasm of bone: Secondary | ICD-10-CM | POA: Diagnosis not present

## 2020-03-24 DIAGNOSIS — C50411 Malignant neoplasm of upper-outer quadrant of right female breast: Secondary | ICD-10-CM | POA: Diagnosis not present

## 2020-03-24 DIAGNOSIS — H2513 Age-related nuclear cataract, bilateral: Secondary | ICD-10-CM | POA: Diagnosis not present

## 2020-03-24 DIAGNOSIS — Z170 Estrogen receptor positive status [ER+]: Secondary | ICD-10-CM | POA: Diagnosis not present

## 2020-03-24 DIAGNOSIS — Z6841 Body Mass Index (BMI) 40.0 and over, adult: Secondary | ICD-10-CM | POA: Diagnosis not present

## 2020-03-24 DIAGNOSIS — Z86711 Personal history of pulmonary embolism: Secondary | ICD-10-CM

## 2020-03-24 DIAGNOSIS — E213 Hyperparathyroidism, unspecified: Secondary | ICD-10-CM | POA: Diagnosis not present

## 2020-03-24 DIAGNOSIS — I890 Lymphedema, not elsewhere classified: Secondary | ICD-10-CM | POA: Diagnosis not present

## 2020-03-24 DIAGNOSIS — Z86718 Personal history of other venous thrombosis and embolism: Secondary | ICD-10-CM | POA: Diagnosis not present

## 2020-03-24 DIAGNOSIS — N390 Urinary tract infection, site not specified: Secondary | ICD-10-CM | POA: Diagnosis not present

## 2020-03-24 DIAGNOSIS — I10 Essential (primary) hypertension: Secondary | ICD-10-CM | POA: Diagnosis not present

## 2020-03-24 DIAGNOSIS — B373 Candidiasis of vulva and vagina: Secondary | ICD-10-CM | POA: Diagnosis not present

## 2020-03-24 DIAGNOSIS — G893 Neoplasm related pain (acute) (chronic): Secondary | ICD-10-CM | POA: Diagnosis not present

## 2020-03-24 DIAGNOSIS — H9190 Unspecified hearing loss, unspecified ear: Secondary | ICD-10-CM | POA: Diagnosis not present

## 2020-03-24 DIAGNOSIS — M25561 Pain in right knee: Secondary | ICD-10-CM | POA: Diagnosis not present

## 2020-03-24 DIAGNOSIS — M25562 Pain in left knee: Secondary | ICD-10-CM | POA: Diagnosis not present

## 2020-03-24 DIAGNOSIS — Z7901 Long term (current) use of anticoagulants: Secondary | ICD-10-CM | POA: Diagnosis not present

## 2020-03-24 MED ORDER — WARFARIN SODIUM 5 MG PO TABS: ORAL_TABLET | ORAL | 1 refills | Status: DC

## 2020-03-24 NOTE — Telephone Encounter (Signed)
This LPN attempted to call pt regarding INR results and change in coumadin dosage. Pt's VM box is full. Attempted to call friend, who picked up phone and hung up immediately, then called pt's son who states he will try to get in touch with his mom and have her call us back.

## 2020-03-27 ENCOUNTER — Ambulatory Visit (INDEPENDENT_AMBULATORY_CARE_PROVIDER_SITE_OTHER): Payer: Medicare Other | Admitting: Podiatry

## 2020-03-27 ENCOUNTER — Other Ambulatory Visit: Payer: Self-pay

## 2020-03-27 DIAGNOSIS — B351 Tinea unguium: Secondary | ICD-10-CM

## 2020-03-27 DIAGNOSIS — M79674 Pain in right toe(s): Secondary | ICD-10-CM | POA: Diagnosis not present

## 2020-03-27 DIAGNOSIS — S90415A Abrasion, left lesser toe(s), initial encounter: Secondary | ICD-10-CM

## 2020-03-27 DIAGNOSIS — M79675 Pain in left toe(s): Secondary | ICD-10-CM | POA: Diagnosis not present

## 2020-03-27 DIAGNOSIS — D689 Coagulation defect, unspecified: Secondary | ICD-10-CM

## 2020-03-29 ENCOUNTER — Encounter: Payer: Self-pay | Admitting: Podiatry

## 2020-03-29 NOTE — Progress Notes (Signed)
Subjective:  Patient ID: Alicia Vasquez, female    DOB: February 19, 1941,  MRN: [id number]  Alicia Vasquez presents to clinic today for at risk care with h/o coagulation defect. Patient is on long term blood thinner Coumadin..  79 y.o. female presents with the above complaint.  Reports painfully elongated nails to both feet. She states she stubbed her left 3rd digit a couple of weeks ago.  Review of Systems: Negative except as noted in the HPI.  Past Medical History:  Diagnosis Date   Allergy    Anxiety    Arthritis    Blood transfusion without reported diagnosis    Breast cancer ([place]) 07/12/2013   Invasive Mammary Carcinoma   DVT (deep vein thrombosis) in pregnancy    Hypertension    Hypothyroid    Metastatic cancer to bone ([place]) dx'd 12/2016   hip   Personal history of radiation therapy    Pneumonia    Radiation 11/21/13-01/07/14   Right Breast/Supraclavicular   Past Surgical History:  Procedure Laterality Date   BREAST LUMPECTOMY Left 2015   BREAST LUMPECTOMY WITH RADIOACTIVE SEED LOCALIZATION Right 09/09/2013   Procedure: BREAST LUMPECTOMY WITH RADIOACTIVE SEED LOCALIZATION WITH AXILLARY NODE EXCISION;  Surgeon: [name], MD;  Location: [facility];  Service: General;  Laterality: Right;   DENTAL SURGERY  04/19/2012   13 TEETH REMOVED   DILATION AND CURETTAGE OF UTERUS     ORIF PERIPROSTHETIC FRACTURE Left 01/31/2017   Procedure: REVISION and OPEN REDUCTION INTERNAL FIXATION (ORIF) PERIPROSTHETIC FRACTURE LEFT HIP;  Surgeon: Paralee Cancel, MD;  Location: [facility];  Service: Orthopedics;  Laterality: Left;  120 mins   RE-EXCISION OF BREAST LUMPECTOMY Right 09/24/2013   Procedure: RE-EXCISION OF RIGHT BREAST LUMPECTOMY;  Surgeon: [name], MD;  Location: [facility];  Service: General;  Laterality: Right;   TOTAL HIP ARTHROPLASTY Left 01/16/2017   Procedure: TOTAL HIP ARTHROPLASTY POSTERIOR;  Surgeon: Paralee Cancel,  MD;  Location: [facility];  Service: Orthopedics;  Laterality: Left;    Current Outpatient Medications:    B-D TB SYRINGE 1CC/27GX1/2" 27G X 1/2" 1 ML MISC, USE TO DRAW UP 1 ML LIDOCAINE AND INJECT INTO CEFTRIAXONE VIAL FOR DILUTION. CHANGE TO 25G NEEDLE FOR ADMINISTRATION, Disp: , Rfl:    cefTRIAXone (ROCEPHIN) 500 MG injection, Inject into the muscle., Disp: , Rfl:    Cholecalciferol (VITAMIN D3) 5000 UNITS TABS, Take 5,000 Units by mouth daily. , Disp: , Rfl:    HYDROcodone-acetaminophen (NORCO/VICODIN) 5-325 MG tablet, Take 1 tablet by mouth every 6 (six) hours as needed for moderate pain., Disp: 120 tablet, Rfl: 0   ketoconazole (NIZORAL) 2 % cream, Apply 1 application topically daily., Disp: 15 g, Rfl: 0   lidocaine (XYLOCAINE) 1 % (with preservative) injection, SMARTSIG:Rectally, Disp: , Rfl:    NEEDLE, DISP, 25 G 25G X 1-1/2" MISC, Please use to administer rocephin injection, Disp: 15 each, Rfl: 0   nitrofurantoin, macrocrystal-monohydrate, (MACROBID) 100 MG capsule, Take 1 capsule (100 mg total) by mouth 2 (two) times daily., Disp: 14 capsule, Rfl: 0   SYNTHROID 125 MCG tablet, TAKE 1 TABLET BY MOUTH EVERY DAY BEFORE BREAKFAST, Disp: 90 tablet, Rfl: 4   Syringe, Disposable, 5 ML MISC, Please use to inject rocephin injection, Disp: 5 each, Rfl: 0   vitamin C (ASCORBIC ACID) 250 MG tablet, Take 250 mg by mouth 3 (three) times daily., Disp: , Rfl:    warfarin (COUMADIN) 5 MG tablet, Take 7.5 mg daily., Disp: 270 tablet, Rfl:  1 Allergies  Allergen Reactions   Anesthetics, Amide Hypertension   Benadryl [Diphenhydramine Hcl] Other (See Comments)    Dizziness   Carbocaine [Mepivacaine Hcl] Hypertension   Codeine Other (See Comments)    Dizziness   Epinephrine Hypertension   Sulfa Antibiotics Other (See Comments)    dizziness   Latex Other (See Comments)    Blisters in mouth   Tramadol     Sedation.    Penicillins Rash    Has patient had a PCN reaction causing  immediate rash, facial/tongue/throat swelling, SOB or lightheadedness with hypotension: Unknown Has patient had a PCN reaction causing severe rash involving mucus membranes or skin necrosis: Unknown Has patient had a PCN reaction that required hospitalization: Unknown Has patient had a PCN reaction occurring within the last 10 years: Unknown If all of the above answers are "NO", then may proceed with Cephalosporin use.    Social History   Occupational History   Not on file  Tobacco Use   Smoking status: Never Smoker   Smokeless tobacco: Never Used  Substance and Sexual Activity   Alcohol use: No    Alcohol/week: 0.0 standard drinks   Drug use: No   Sexual activity: Never    Objective:   Constitutional Alicia Vasquez is a pleasant 79 y.o. Caucasian female, morbidly obese in NAD. AAO x 3.   Vascular  No cyanosis or clubbing noted. Pedal hair growth diminished b/l. Capillary fill time to digits <3 seconds b/l lower extremities. Faintly palpable pedal pulses b/l. Pedal hair present. Lower extremity skin temperature gradient within normal limits.  Neurologic Normal speech. Oriented to person, place, and time. Protective sensation diminished with 10g monofilament b/l. Vibratory sensation intact b/l. Proprioception intact bilaterally.  Dermatologic Pedal skin with normal turgor, texture and tone bilaterally. No open wounds bilaterally. No interdigital macerations bilaterally. Toenails 1-5 b/l elongated, discolored, dystrophic, thickened, crumbly with subungual debris and tenderness to dorsal palpation. Left 3rd digit with abrasion. No erythema, no edema, no fluctuance, no drainage.  Orthopedic: Normal muscle strength 5/5 to all lower extremity muscle groups bilaterally. No pain crepitus or joint limitation noted with ROM b/l. No bony tenderness.    Radiographs: None Assessment:   1. Pain due to onychomycosis of toenails of both feet   2. Abrasion of third toe of left foot,  initial encounter   3. 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  Onychomycosis with pain -Nails palliatively debridement as below -Educated on self-care  Procedure: Nail Debridement Rationale: Pain Type of Debridement: manual, sharp debridement. Instrumentation: Nail nipper, rotary burr. Number of Nails: 10 -Examined patient. -No new findings. No new orders. -Toenails 1-5 b/l were debrided in length and girth with sterile nail nippers and dremel without iatrogenic bleeding.  -Patient to report any pedal injuries to medical professional immediately. -Patient was instructed to apply TAO to left 3rd digit once daily for one week. -Patient to continue soft, supportive shoe gear daily. -Patient/POA to call should there be question/concern in the interim.  Return in about 3 months (around 06/27/2020).  Marzetta Board, DPM

## 2020-04-02 ENCOUNTER — Telehealth: Payer: Self-pay

## 2020-04-02 DIAGNOSIS — G893 Neoplasm related pain (acute) (chronic): Secondary | ICD-10-CM | POA: Diagnosis not present

## 2020-04-02 DIAGNOSIS — I890 Lymphedema, not elsewhere classified: Secondary | ICD-10-CM | POA: Diagnosis not present

## 2020-04-02 DIAGNOSIS — N390 Urinary tract infection, site not specified: Secondary | ICD-10-CM | POA: Diagnosis not present

## 2020-04-02 DIAGNOSIS — B373 Candidiasis of vulva and vagina: Secondary | ICD-10-CM | POA: Diagnosis not present

## 2020-04-02 DIAGNOSIS — C50411 Malignant neoplasm of upper-outer quadrant of right female breast: Secondary | ICD-10-CM | POA: Diagnosis not present

## 2020-04-02 DIAGNOSIS — C7951 Secondary malignant neoplasm of bone: Secondary | ICD-10-CM | POA: Diagnosis not present

## 2020-04-02 NOTE — Telephone Encounter (Signed)
Dr. Jonni Sanger recommended office visit with Dr. Jerline Pain per previous message pertaining to this. She has NP appt with him at the end of the month

## 2020-04-02 NOTE — Telephone Encounter (Signed)
FYI I have already provided her and her friend a copy of the UTI when she came in for her first dose of rocephin.

## 2020-04-02 NOTE — Telephone Encounter (Signed)
RN spoke with Cecille Rubin, RN with home health regarding recent INR results.    RN verbalized last results, and recommendations for Coumadin 7.5mg  daily and that we were unable to make contact with patient.    Cecille Rubin, RN will attempt contact with patient.  No further needs.  Patient scheduled for INR recheck on 11/17.

## 2020-04-02 NOTE — Telephone Encounter (Signed)
Patient states she is frustrated because she feels like Dr.Andy is not doing her job correctly, as Dr.Andy is the one who saw her for the UTI, felt like she could have seen her again but is "pushing" her to [place]. Alicia Vasquez states she doesn't want an appointment with Dr.Parker at this time, but does want a copy of her urine sample.

## 2020-04-02 NOTE — Telephone Encounter (Signed)
Alicia Vasquez is calling in from [facility] on behalf of the patient asking if they were wanting to do a repeat on her urine as she is still having some burning when going to the restroom and has completed antibiotics.

## 2020-04-02 NOTE — Telephone Encounter (Signed)
If patient is still having symptoms, order urine culture.  If needs to be seen, I rec seeing if Dr. Jerline Pain can see her for this as he will be her new PCP.  thanks

## 2020-04-03 ENCOUNTER — Other Ambulatory Visit: Payer: Self-pay

## 2020-04-03 DIAGNOSIS — R300 Dysuria: Secondary | ICD-10-CM

## 2020-04-03 DIAGNOSIS — B9689 Other specified bacterial agents as the cause of diseases classified elsewhere: Secondary | ICD-10-CM

## 2020-04-03 DIAGNOSIS — N390 Urinary tract infection, site not specified: Secondary | ICD-10-CM

## 2020-04-03 NOTE — Telephone Encounter (Signed)
LVM asking patient to call back.  

## 2020-04-03 NOTE — Telephone Encounter (Signed)
Please call patient and schedule lab visit only for a urine culture

## 2020-04-13 DIAGNOSIS — C50411 Malignant neoplasm of upper-outer quadrant of right female breast: Secondary | ICD-10-CM | POA: Diagnosis not present

## 2020-04-13 DIAGNOSIS — I890 Lymphedema, not elsewhere classified: Secondary | ICD-10-CM | POA: Diagnosis not present

## 2020-04-13 DIAGNOSIS — N390 Urinary tract infection, site not specified: Secondary | ICD-10-CM | POA: Diagnosis not present

## 2020-04-13 DIAGNOSIS — G893 Neoplasm related pain (acute) (chronic): Secondary | ICD-10-CM | POA: Diagnosis not present

## 2020-04-13 DIAGNOSIS — B373 Candidiasis of vulva and vagina: Secondary | ICD-10-CM | POA: Diagnosis not present

## 2020-04-13 DIAGNOSIS — C7951 Secondary malignant neoplasm of bone: Secondary | ICD-10-CM | POA: Diagnosis not present

## 2020-04-14 NOTE — Progress Notes (Signed)
[place]  [phone] [phone]     ID: Alicia Vasquez OB: December 08, 1940  MR#: [id number]  FUX#:[id number]  Patient Care Team: Patient, No Pcp Per as PCP - General (General Practice) Philemon Kingdom, MD as Consulting Physician (Internal Medicine) Caprice Renshaw, MD as Referring Physician (Internal Medicine) Magrinat, [name], MD as Consulting Physician (Oncology) [name], DDS as Consulting Physician (Dentistry) [name], [place] as Consulting Physician (Physician Assistant) [name] Tamala Fothergill, DPM as Consulting Physician (Podiatry) OTHER MD:   CHIEF COMPLAINT: Estrogen receptor positive breast cancer  CURRENT TREATMENT:  Fulvestrant; warfarin   INTERVAL HISTORY: Alicia Vasquez returns today for follow-up and treatment of her metastatic estrogen receptor positive breast cancer  She continues on fulvestrant.  She is tolerating this well. She tells me she feels tired a few days after each treatment but otherwise has no symptoms to report  She says she is taking Warfarin daily and is not having any easy bruising or bleeding. Currently she is taking it at 5 mg alternating with 7-1/2 mg.  Since her last visit, she underwent left diagnostic mammography with tomography and left breast ultrasonography at [facility] on 01/28/2020 showing: breast density category C; 1 cm left breast mass at 3:30 consistent with benign simple cyst.   REVIEW OF SYSTEMS: Alicia Vasquez continues to have pain particularly in her back. She uses a very stable dose of hydrocodone acetaminophen for this. That allows her to function more normally. She has no unusual headaches visual changes cough phlegm production pleurisy shortness of breath or change in bowel or bladder habits.   COVID 19 VACCINATION STATUS:    BREAST CANCER HISTORY: From doctor [name]'s intake node 07/24/2013:  "79 y.o. female. Who presented with SOB and had a CT chest performed that revealed a  right breast mass. Mammogram/ultrsound on 2/13 showed a mass in the 11 o'clock position in the right breast measuring 1.5 cm. Also noted was a right axillary LN measuring 1.6 cm. MRI not performed. Biopsy of mass and lymph done. 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 Lymph node + for metastatic carcinoma."  [On 09/09/2013 the patient underwent right lumpectomy and sentinel lymph node sampling. This showed (SZA [id number]) multifocal invasive ductal carcinoma, grade 1, the largest lesion measuring 1.8 cm, the second lesion 1.2 cm. One of 4 sentinel lymph nodes was positive, with extracapsular extension. Margins were positive. HER-2 was repeated and was again negative. Further surgery 09/16/2013 obtained clear margins.  Her subsequent history is as detailed below   PAST MEDICAL HISTORY: Past Medical History:  Diagnosis Date  . Allergy   . Anxiety   . Arthritis   . Blood transfusion without reported diagnosis   . Breast cancer ([place]) 07/12/2013   Invasive Mammary Carcinoma  . DVT (deep vein thrombosis) in pregnancy   . Hypertension   . Hypothyroid   . Metastatic cancer to bone ([place]) dx'd 12/2016   hip  . Personal history of radiation therapy   . Pneumonia   . Radiation 11/21/13-01/07/14   Right Breast/Supraclavicular    PAST SURGICAL HISTORY: Past Surgical History:  Procedure Laterality Date  . BREAST LUMPECTOMY Left 2015  . BREAST LUMPECTOMY WITH RADIOACTIVE SEED LOCALIZATION Right 09/09/2013   Procedure: BREAST LUMPECTOMY WITH RADIOACTIVE SEED LOCALIZATION WITH AXILLARY NODE EXCISION;  Surgeon: [name], MD;  Location: [facility];  Service: General;  Laterality: Right;  .  DENTAL SURGERY  04/19/2012   13 TEETH REMOVED  . DILATION AND CURETTAGE OF UTERUS    . ORIF PERIPROSTHETIC FRACTURE Left 01/31/2017   Procedure: REVISION and OPEN REDUCTION INTERNAL  FIXATION (ORIF) PERIPROSTHETIC FRACTURE LEFT HIP;  Surgeon: Paralee Cancel, MD;  Location: [facility];  Service: Orthopedics;  Laterality: Left;  120 mins  . RE-EXCISION OF BREAST LUMPECTOMY Right 09/24/2013   Procedure: RE-EXCISION OF RIGHT BREAST LUMPECTOMY;  Surgeon: [name], MD;  Location: [facility];  Service: General;  Laterality: Right;  . TOTAL HIP ARTHROPLASTY Left 01/16/2017   Procedure: TOTAL HIP ARTHROPLASTY POSTERIOR;  Surgeon: Paralee Cancel, MD;  Location: [facility];  Service: Orthopedics;  Laterality: Left;    FAMILY HISTORY Family History  Problem Relation Age of Onset  . Heart disease Brother   . Colon cancer Brother   . Prostate cancer Brother    the patient's father died at the age of 30 after an automobile accident. The patient's mother died at the age of 75. She was a Marine scientist here in [name] in the old [facility]. She was infected with polio and was confined to a wheelchair for a good part of her life. She eventually died of pneumonia. The patient had one brother, who died with prostate cancer. She had no sisters. There is no history of breast or ovarian cancer in the family.   GYNECOLOGIC HISTORY:  Menarche age 26, first live birth age 45, the patient is GX P1. She went through the change of life at age 57. 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 She is widowed. Currently she is staying with her friend Alicia Vasquez, 59 years old, who is a retired Radio producer.   The patient's son Alicia Vasquez lived in [place] but more recently he moved into [place] prior home. He works in Engineer, technical sales. The patient tells me if Alicia Vasquez dies she would be moving in with her son. Recall her sons wife died from cancer some years ago.  The patient has no grandchildren. She is a Tourist information centre manager but currently attends a [employer] with her friend Alicia Vasquez    ADVANCED DIRECTIVES: Not in place   HEALTH  MAINTENANCE: Social History   Tobacco Use  . Smoking status: Never Smoker  . Smokeless tobacco: Never Used  Substance Use Topics  . Alcohol use: No    Alcohol/week: 0.0 standard drinks  . Drug use: No     Colonoscopy: Never  PAP:  Bone density: 09/20/2016 showed a T score of -2.2  Lipid panel:  Allergies  Allergen Reactions  . Anesthetics, [place] Hypertension  . Benadryl [Diphenhydramine Hcl] Other (See Comments)    Dizziness  . Carbocaine [Mepivacaine Hcl] Hypertension  . Codeine Other (See Comments)    Dizziness  . Epinephrine Hypertension  . Sulfa Antibiotics Other (See Comments)    dizziness  . Latex Other (See Comments)    Blisters in mouth  . Tramadol     Sedation.   Marland Kitchen Penicillins Rash    Has patient had a PCN reaction causing immediate rash, facial/tongue/throat swelling, SOB or lightheadedness with hypotension: Unknown Has patient had a PCN reaction causing severe rash involving mucus membranes or skin necrosis: Unknown Has patient had a PCN reaction that required hospitalization: Unknown Has patient had a PCN reaction occurring within the last 10 years: Unknown If all of the above answers are "NO", then may proceed with Cephalosporin use.  Current Outpatient Medications  Medication Sig Dispense Refill  . B-D TB SYRINGE 1CC/27GX1/2" 27G X 1/2" 1 ML MISC USE TO DRAW UP 1 ML LIDOCAINE AND INJECT INTO CEFTRIAXONE VIAL FOR DILUTION. CHANGE TO 25G NEEDLE FOR ADMINISTRATION    . cefTRIAXone (ROCEPHIN) 500 MG injection Inject into the muscle.    . Cholecalciferol (VITAMIN D3) 5000 UNITS TABS Take 5,000 Units by mouth daily.     Marland Kitchen HYDROcodone-acetaminophen (NORCO/VICODIN) 5-325 MG tablet Take 1 tablet by mouth every 6 (six) hours as needed for moderate pain. 120 tablet 0  . ketoconazole (NIZORAL) 2 % cream Apply 1 application topically daily. 15 g 0  . lidocaine (XYLOCAINE) 1 % (with preservative) injection SMARTSIG:Rectally    . NEEDLE, DISP, 25 G 25G X 1-1/2" MISC  Please use to administer rocephin injection 15 each 0  . nitrofurantoin, macrocrystal-monohydrate, (MACROBID) 100 MG capsule Take 1 capsule (100 mg total) by mouth 2 (two) times daily. 14 capsule 0  . SYNTHROID 125 MCG tablet TAKE 1 TABLET BY MOUTH EVERY DAY BEFORE BREAKFAST 90 tablet 4  . Syringe, Disposable, 5 ML MISC Please use to inject rocephin injection 5 each 0  . vitamin C (ASCORBIC ACID) 250 MG tablet Take 250 mg by mouth 3 (three) times daily.    Marland Kitchen warfarin (COUMADIN) 5 MG tablet Take 7.5 mg daily. 270 tablet 1   No current facility-administered medications for this visit.    OBJECTIVE: white woman examined in a wheelchair  Vitals:   04/15/20 1214  BP: (!) 162/94  Pulse: 85  Resp: 18  Temp: 98.5 F (36.9 C)  SpO2: 96%   Wt Readings from Last 3 Encounters:  04/15/20 228 lb 11.2 oz (103.7 kg)  01/22/20 225 lb 14.4 oz (102.5 kg)  10/02/19 227 lb 11.2 oz (103.3 kg)   Body mass index is 39.26 kg/m.    ECOG FS:2 - Symptomatic, <50% confined to bed  Sclerae unicteric, EOMs intact Wearing a mask No cervical or supraclavicular adenopathy Lungs no rales or rhonchi Heart regular rate and rhythm Abd soft, nontender, positive bowel sounds MSK no focal spinal tenderness, no upper extremity lymphedema Neuro: nonfocal, well oriented, appropriate affect Breasts: The right breast is unremarkable. The left breast is status post prior surgery. There is no evidence of local recurrence. Both axillae are benign.   LAB RESULTS:  CMP     Component Value Date/Time   NA 141 08/07/2019 1305   NA 141 05/16/2017 1416   K 4.4 08/07/2019 1305   K 3.8 05/16/2017 1416   CL 105 08/07/2019 1305   CO2 28 08/07/2019 1305   CO2 27 05/16/2017 1416   GLUCOSE 153 (H) 08/07/2019 1305   GLUCOSE 140 05/16/2017 1416   BUN 13 08/07/2019 1305   BUN 13.2 05/16/2017 1416   CREATININE 0.89 08/07/2019 1305   CREATININE 0.77 03/21/2018 1417   CREATININE 0.80 09/22/2017 1555   CREATININE 0.8  05/16/2017 1416   CALCIUM 10.9 (H) 08/07/2019 1305   CALCIUM 11.0 (H) 05/16/2017 1416   PROT 6.8 08/07/2019 1305   PROT 6.9 05/16/2017 1416   ALBUMIN 3.8 08/07/2019 1305   ALBUMIN 3.6 05/16/2017 1416   AST 13 (L) 08/07/2019 1305   AST 14 (L) 03/21/2018 1417   AST 13 05/16/2017 1416   ALT 14 08/07/2019 1305   ALT 14 03/21/2018 1417   ALT <6 05/16/2017 1416   ALKPHOS 157 (H) 08/07/2019 1305   ALKPHOS 130 05/16/2017 1416   BILITOT 0.3 08/07/2019 1305  BILITOT 0.3 03/21/2018 1417   BILITOT 0.31 05/16/2017 1416   GFRNONAA >60 08/07/2019 1305   GFRNONAA >60 03/21/2018 1417   GFRNONAA 75 08/19/2015 1602   GFRAA >60 08/07/2019 1305   GFRAA >60 03/21/2018 1417   GFRAA 87 08/19/2015 1602    I No results found for: SPEP  Lab Results  Component Value Date   WBC 7.9 04/15/2020   NEUTROABS 5.5 04/15/2020   HGB 12.8 04/15/2020   HCT 39.9 04/15/2020   MCV 89.3 04/15/2020   PLT 260 04/15/2020      Chemistry      Component Value Date/Time   NA 141 08/07/2019 1305   NA 141 05/16/2017 1416   K 4.4 08/07/2019 1305   K 3.8 05/16/2017 1416   CL 105 08/07/2019 1305   CO2 28 08/07/2019 1305   CO2 27 05/16/2017 1416   BUN 13 08/07/2019 1305   BUN 13.2 05/16/2017 1416   CREATININE 0.89 08/07/2019 1305   CREATININE 0.77 03/21/2018 1417   CREATININE 0.80 09/22/2017 1555   CREATININE 0.8 05/16/2017 1416      Component Value Date/Time   CALCIUM 10.9 (H) 08/07/2019 1305   CALCIUM 11.0 (H) 05/16/2017 1416   ALKPHOS 157 (H) 08/07/2019 1305   ALKPHOS 130 05/16/2017 1416   AST 13 (L) 08/07/2019 1305   AST 14 (L) 03/21/2018 1417   AST 13 05/16/2017 1416   ALT 14 08/07/2019 1305   ALT 14 03/21/2018 1417   ALT <6 05/16/2017 1416   BILITOT 0.3 08/07/2019 1305   BILITOT 0.3 03/21/2018 1417   BILITOT 0.31 05/16/2017 1416       No results found for: LABCA2  No components found for: LABCA125  Recent Labs  Lab 04/15/20 1135  INR 1.9*    Urinalysis    Component Value  Date/Time   COLORURINE YELLOW 04/15/2020 1243   APPEARANCEUR CLOUDY (A) 04/15/2020 1243   LABSPEC 1.010 04/15/2020 1243   LABSPEC 1.010 09/22/2016 1553   PHURINE 8.0 04/15/2020 1243   GLUCOSEU NEGATIVE 04/15/2020 1243   GLUCOSEU Negative 09/22/2016 1553   HGBUR NEGATIVE 04/15/2020 1243   [place] 04/15/2020 1243   BILIRUBINUR negative 02/14/2020 1644   BILIRUBINUR Negative 09/22/2016 1553   KETONESUR NEGATIVE 04/15/2020 1243   PROTEINUR NEGATIVE 04/15/2020 1243   UROBILINOGEN 0.2 02/14/2020 1644   UROBILINOGEN 0.2 11/15/2016 1358   UROBILINOGEN 0.2 09/22/2016 1553   NITRITE NEGATIVE 04/15/2020 1243   LEUKOCYTESUR TRACE (A) 04/15/2020 1243   LEUKOCYTESUR Negative 09/22/2016 1553    STUDIES: No results found.   ASSESSMENT: 79 y.o. [place] woman status post right breast upper outer quadrant lumpectomy and sentinel lymph node sampling 09/09/2013 for an mpT1c pN1a, stage IIA invasive ductal carcinoma, estrogen and progesterone receptor both 100% positive with strong staining intensity, MIB-1 of 17% and no HER-2 amplification  (1) additional surgery for margin clearance 09/16/2013 obtained negative margins  (2) Oncotype DX recurrence score of 4 predicts a risk of outside the breast recurrence within 10 years of 7% if the patient's only systemic therapy is tamoxifen for 5 years. It also predicts no benefit from chemotherapy  (3) adjuvant radiation completed 01/07/2014  (4) anastrozole started 02/27/2014 stopped within 2 weeks because of arm swelling.   (a) bone density April 2016 showed osteopenia, with a t-score of -1.6  (b) anastrozole resumed 12/17/2015  (c) Bone density 09/20/2016 showed a T score of -2.2  (5) history of left lower extremity DVT 11/23/2012, initially on rivaroxaban, which caused chest pain, switch  to Coumadin July 2014  (b) coumadin dose increased to 7.5/10 mg alternating days as of 06/13/2017   METASTATIC DISEASE: August 2018 (6) status post  left total hip replacement 01/16/2017 for estrogen receptor positive adenocarcinoma.  (a) CA 27-29 was 46.4 as of 04/18/2017  (b) chest CT scan 05/02/2017 shows no lung or liver lesion concern; it does show aortic atherosclerosis  (c) baseline bone scan 05/02/2017 was negative  (d) PET scan 09/12/2017 shows no active disease, including bone  (e) PET scan and CT chest on 05/29/2018 show no active disease  (f) chest CT and bone scan 03/18/2019 showed no evidence of active disease  (7) fulvestrant started 04/18/2017  (8) denosumab/Xgeva started 05/16/2017  (a) changed to every 12-weeks after 10/03/2017 dose   (b) held starting with 06/13/2018 dose due to dental concerns  (9) unprovoked deep vein thrombosis involving the left posterior tibial v documented 11/23/2012, resolved on repeat 06/27/2013 and not recurrent on multiple Dopplers since, most recent 03/06/2018  (a) on chronic anticoagulation with warfarin given ongoing risks (stage IV breast cancer, relative immobility secondary to claudication   PLAN:  Kimorah is now a little over 3 years out from definitive surgery for her breast cancer with no evidence of disease recurrence. This is favorable.  She is tolerating Faslodex well and the plan is to continue that until there is evidence of disease progression.  We are having trouble with her warfarin. At the current dose as she is still not therapeutic. I suggested she go to 7-1/2 alternating with 10 mg daily.  She will have her next Faslodex dose in 4 weeks. We will continue to see her on an every 50-monthbasis. She knows to call for any other issue that may develop before the return visit here.  Total encounter time 25 minutes.*Sarajane JewsC. Magrinat, MD 04/15/20 4:48 PM [facility] On[facility] [place] [id number]. [phone]   Fax. [phone]  I, KWilburn Mylar am acting as scribe for Dr. GVirgie Dad Magrinat.  I,  GLurline DelMD, have reviewed the above documentation for accuracy and completeness, and I agree with the above.    *Total Encounter Time as defined by the Centers for Medicare and Medicaid Services includes, in addition to the face-to-face time of a patient visit (documented in the note above) non-face-to-face time: obtaining and reviewing outside history, ordering and reviewing medications, tests or procedures, care coordination (communications with other health care professionals or caregivers) and documentation in the medical record.

## 2020-04-15 ENCOUNTER — Other Ambulatory Visit: Payer: Self-pay

## 2020-04-15 ENCOUNTER — Inpatient Hospital Stay: Payer: Medicare Other | Attending: Oncology

## 2020-04-15 ENCOUNTER — Inpatient Hospital Stay: Payer: Medicare Other

## 2020-04-15 ENCOUNTER — Inpatient Hospital Stay (HOSPITAL_BASED_OUTPATIENT_CLINIC_OR_DEPARTMENT_OTHER): Payer: Medicare Other | Admitting: Oncology

## 2020-04-15 VITALS — BP 162/94 | HR 85 | Temp 98.5°F | Resp 18 | Ht 64.0 in | Wt 228.7 lb

## 2020-04-15 DIAGNOSIS — C50411 Malignant neoplasm of upper-outer quadrant of right female breast: Secondary | ICD-10-CM | POA: Diagnosis not present

## 2020-04-15 DIAGNOSIS — Z86718 Personal history of other venous thrombosis and embolism: Secondary | ICD-10-CM | POA: Diagnosis not present

## 2020-04-15 DIAGNOSIS — Z5111 Encounter for antineoplastic chemotherapy: Secondary | ICD-10-CM | POA: Diagnosis not present

## 2020-04-15 DIAGNOSIS — C7951 Secondary malignant neoplasm of bone: Secondary | ICD-10-CM | POA: Diagnosis not present

## 2020-04-15 DIAGNOSIS — Z170 Estrogen receptor positive status [ER+]: Secondary | ICD-10-CM | POA: Diagnosis not present

## 2020-04-15 DIAGNOSIS — I82401 Acute embolism and thrombosis of unspecified deep veins of right lower extremity: Secondary | ICD-10-CM

## 2020-04-15 DIAGNOSIS — Z923 Personal history of irradiation: Secondary | ICD-10-CM | POA: Insufficient documentation

## 2020-04-15 DIAGNOSIS — M8580 Other specified disorders of bone density and structure, unspecified site: Secondary | ICD-10-CM | POA: Diagnosis not present

## 2020-04-15 DIAGNOSIS — M899 Disorder of bone, unspecified: Secondary | ICD-10-CM

## 2020-04-15 DIAGNOSIS — M84659P Pathological fracture in other disease, hip, unspecified, subsequent encounter for fracture with malunion: Secondary | ICD-10-CM

## 2020-04-15 DIAGNOSIS — I700 Atherosclerosis of aorta: Secondary | ICD-10-CM

## 2020-04-15 DIAGNOSIS — M84459A Pathological fracture, hip, unspecified, initial encounter for fracture: Secondary | ICD-10-CM

## 2020-04-15 DIAGNOSIS — Z7901 Long term (current) use of anticoagulants: Secondary | ICD-10-CM | POA: Insufficient documentation

## 2020-04-15 DIAGNOSIS — D6859 Other primary thrombophilia: Secondary | ICD-10-CM

## 2020-04-15 DIAGNOSIS — Z79899 Other long term (current) drug therapy: Secondary | ICD-10-CM | POA: Diagnosis not present

## 2020-04-15 LAB — CBC WITH DIFFERENTIAL/PLATELET
Abs Immature Granulocytes: 0.03 10*3/uL (ref 0.00–0.07)
Basophils Absolute: 0.1 10*3/uL (ref 0.0–0.1)
Basophils Relative: 1 %
Eosinophils Absolute: 0.2 10*3/uL (ref 0.0–0.5)
Eosinophils Relative: 3 %
HCT: 39.9 % (ref 36.0–46.0)
Hemoglobin: 12.8 g/dL (ref 12.0–15.0)
Immature Granulocytes: 0 %
Lymphocytes Relative: 14 %
Lymphs Abs: 1.1 10*3/uL (ref 0.7–4.0)
MCH: 28.6 pg (ref 26.0–34.0)
MCHC: 32.1 g/dL (ref 30.0–36.0)
MCV: 89.3 fL (ref 80.0–100.0)
Monocytes Absolute: 1 10*3/uL (ref 0.1–1.0)
Monocytes Relative: 12 %
Neutro Abs: 5.5 10*3/uL (ref 1.7–7.7)
Neutrophils Relative %: 70 %
Platelets: 260 10*3/uL (ref 150–400)
RBC: 4.47 MIL/uL (ref 3.87–5.11)
RDW: 15 % (ref 11.5–15.5)
WBC: 7.9 10*3/uL (ref 4.0–10.5)
nRBC: 0 % (ref 0.0–0.2)

## 2020-04-15 LAB — URINALYSIS, COMPLETE (UACMP) WITH MICROSCOPIC
Bilirubin Urine: NEGATIVE
Glucose, UA: NEGATIVE mg/dL
Hgb urine dipstick: NEGATIVE
Ketones, ur: NEGATIVE mg/dL
Nitrite: NEGATIVE
Protein, ur: NEGATIVE mg/dL
Specific Gravity, Urine: 1.01 (ref 1.005–1.030)
pH: 8 (ref 5.0–8.0)

## 2020-04-15 LAB — PROTIME-INR
INR: 1.9 — ABNORMAL HIGH (ref 0.8–1.2)
Prothrombin Time: 21.4 seconds — ABNORMAL HIGH (ref 11.4–15.2)

## 2020-04-15 MED ORDER — FULVESTRANT 250 MG/5ML IM SOLN
INTRAMUSCULAR | Status: AC
  Filled 2020-04-15: qty 10

## 2020-04-15 MED ORDER — FULVESTRANT 250 MG/5ML IM SOLN
500.0000 mg | Freq: Once | INTRAMUSCULAR | Status: AC
  Administered 2020-04-15: 500 mg via INTRAMUSCULAR

## 2020-04-15 NOTE — Patient Instructions (Signed)
Fulvestrant injection What is this medicine? FULVESTRANT (ful VES trant) blocks the effects of estrogen. It is used to treat breast cancer. This medicine may be used for other purposes; ask your health care provider or pharmacist if you have questions. COMMON BRAND NAME(S): FASLODEX What should I tell my health care provider before I take this medicine? They need to know if you have any of these conditions:  bleeding disorders  liver disease  low blood counts, like low white cell, platelet, or red cell counts  an unusual or allergic reaction to fulvestrant, other medicines, foods, dyes, or preservatives  pregnant or trying to get pregnant  breast-feeding How should I use this medicine? This medicine is for injection into a muscle. It is usually given by a health care professional in a hospital or clinic setting. Talk to your pediatrician regarding the use of this medicine in children. Special care may be needed. Overdosage: If you think you have taken too much of this medicine contact a poison control center or emergency room at once. NOTE: This medicine is only for you. Do not share this medicine with others. What if I miss a dose? It is important not to miss your dose. Call your doctor or health care professional if you are unable to keep an appointment. What may interact with this medicine?  medicines that treat or prevent blood clots like warfarin, enoxaparin, dalteparin, apixaban, dabigatran, and rivaroxaban This list may not describe all possible interactions. Give your health care provider a list of all the medicines, herbs, non-prescription drugs, or dietary supplements you use. Also tell them if you smoke, drink alcohol, or use illegal drugs. Some items may interact with your medicine. What should I watch for while using this medicine? Your condition will be monitored carefully while you are receiving this medicine. You will need important blood work done while you are taking  this medicine. Do not become pregnant while taking this medicine or for at least 1 year after stopping it. Women of child-bearing potential will need to have a negative pregnancy test before starting this medicine. Women should inform their doctor if they wish to become pregnant or think they might be pregnant. There is a potential for serious side effects to an unborn child. Men should inform their doctors if they wish to father a child. This medicine may lower sperm counts. Talk to your health care professional or pharmacist for more information. Do not breast-feed an infant while taking this medicine or for 1 year after the last dose. What side effects may I notice from receiving this medicine? Side effects that you should report to your doctor or health care professional as soon as possible:  allergic reactions like skin rash, itching or hives, swelling of the face, lips, or tongue  feeling faint or lightheaded, falls  pain, tingling, numbness, or weakness in the legs  signs and symptoms of infection like fever or chills; cough; flu-like symptoms; sore throat  vaginal bleeding Side effects that usually do not require medical attention (report to your doctor or health care professional if they continue or are bothersome):  aches, pains  constipation  diarrhea  headache  hot flashes  nausea, vomiting  pain at site where injected  stomach pain This list may not describe all possible side effects. Call your doctor for medical advice about side effects. You may report side effects to FDA at 1-800-FDA-1088. Where should I keep my medicine? This drug is given in a hospital or clinic and will   not be stored at home. NOTE: This sheet is a summary. It may not cover all possible information. If you have questions about this medicine, talk to your doctor, pharmacist, or health care provider.  2020 Elsevier/Gold Standard (2017-08-24 11:34:41)  

## 2020-04-16 LAB — CANCER ANTIGEN 27.29: CA 27.29: 39.9 U/mL — ABNORMAL HIGH (ref 0.0–38.6)

## 2020-04-17 ENCOUNTER — Other Ambulatory Visit: Payer: Self-pay | Admitting: Oncology

## 2020-04-17 LAB — URINE CULTURE: Culture: >=100000 — AB

## 2020-04-17 MED ORDER — NITROFURANTOIN MONOHYD MACRO 100 MG PO CAPS: 100.0000 mg | ORAL_CAPSULE | Freq: Two times a day (BID) | ORAL | 0 refills | Status: DC

## 2020-04-27 ENCOUNTER — Encounter: Payer: Self-pay | Admitting: Family Medicine

## 2020-04-27 ENCOUNTER — Ambulatory Visit (INDEPENDENT_AMBULATORY_CARE_PROVIDER_SITE_OTHER): Payer: Medicare Other | Admitting: Family Medicine

## 2020-04-27 VITALS — Ht 64.0 in | Wt 220.0 lb

## 2020-04-27 DIAGNOSIS — E039 Hypothyroidism, unspecified: Secondary | ICD-10-CM

## 2020-04-27 DIAGNOSIS — Z170 Estrogen receptor positive status [ER+]: Secondary | ICD-10-CM

## 2020-04-27 DIAGNOSIS — C50411 Malignant neoplasm of upper-outer quadrant of right female breast: Secondary | ICD-10-CM

## 2020-04-27 DIAGNOSIS — I82401 Acute embolism and thrombosis of unspecified deep veins of right lower extremity: Secondary | ICD-10-CM

## 2020-04-27 NOTE — Assessment & Plan Note (Signed)
Follows with oncology.  Currently in remission.

## 2020-04-27 NOTE — Assessment & Plan Note (Signed)
Stable.  Managed by oncology.  Anticoagulated on warfarin

## 2020-04-27 NOTE — Assessment & Plan Note (Signed)
.    Last TSH at goal.  On Synthroid 125 mcg daily per  endocrinology.

## 2020-04-27 NOTE — Progress Notes (Signed)
   Alicia Vasquez is a 79 y.o. female who presents today for a telephone visit.  Assessment/Plan:  Chronic Problems Addressed Today: DVT, lower extremity ([place]) Stable.  Managed by oncology.  Anticoagulated on warfarin  Hypothyroid .  Last TSH at goal.  On Synthroid 125 mcg daily per  endocrinology.  Malignant neoplasm of upper-outer quadrant of right breast in female, estrogen receptor positive ([place]) Follows with oncology.  Currently in remission.     Subjective:  HPI:  Patient here to transfer care.  Previous PCP left office about a year ago.  She has seen a few providers in this office in setting but is reestablishing care today.  She has no acute complaints.  Please see A/P for status of chronic conditions.       Objective/Observations   NAD  Telephone Visit   I connected with Alicia Vasquez on 04/27/20 at  2:20 PM EST via telephone and verified that I am speaking with the correct person using two identifiers. I discussed the limitations of evaluation and management by telemedicine and the availability of in person appointments. The patient expressed understanding and agreed to proceed.   Patient location: Home Provider location: [place] participating in the virtual visit: Myself and Patient  A total of 21 minutes were spent on medical discussion.      Algis Greenhouse. Jerline Pain, MD 04/27/2020 3:03 PM

## 2020-05-14 ENCOUNTER — Inpatient Hospital Stay: Payer: Medicare Other

## 2020-05-14 ENCOUNTER — Inpatient Hospital Stay: Payer: Medicare Other | Attending: Oncology

## 2020-05-14 ENCOUNTER — Other Ambulatory Visit: Payer: Self-pay

## 2020-05-14 VITALS — BP 148/68 | HR 78 | Temp 98.2°F | Resp 18

## 2020-05-14 DIAGNOSIS — Z79899 Other long term (current) drug therapy: Secondary | ICD-10-CM | POA: Diagnosis not present

## 2020-05-14 DIAGNOSIS — M84659P Pathological fracture in other disease, hip, unspecified, subsequent encounter for fracture with malunion: Secondary | ICD-10-CM

## 2020-05-14 DIAGNOSIS — C7951 Secondary malignant neoplasm of bone: Secondary | ICD-10-CM | POA: Insufficient documentation

## 2020-05-14 DIAGNOSIS — I82401 Acute embolism and thrombosis of unspecified deep veins of right lower extremity: Secondary | ICD-10-CM

## 2020-05-14 DIAGNOSIS — I10 Essential (primary) hypertension: Secondary | ICD-10-CM | POA: Insufficient documentation

## 2020-05-14 DIAGNOSIS — Z5111 Encounter for antineoplastic chemotherapy: Secondary | ICD-10-CM | POA: Insufficient documentation

## 2020-05-14 DIAGNOSIS — E039 Hypothyroidism, unspecified: Secondary | ICD-10-CM | POA: Insufficient documentation

## 2020-05-14 DIAGNOSIS — Z170 Estrogen receptor positive status [ER+]: Secondary | ICD-10-CM | POA: Insufficient documentation

## 2020-05-14 DIAGNOSIS — M84459A Pathological fracture, hip, unspecified, initial encounter for fracture: Secondary | ICD-10-CM

## 2020-05-14 DIAGNOSIS — Z86718 Personal history of other venous thrombosis and embolism: Secondary | ICD-10-CM | POA: Insufficient documentation

## 2020-05-14 DIAGNOSIS — M899 Disorder of bone, unspecified: Secondary | ICD-10-CM

## 2020-05-14 DIAGNOSIS — G893 Neoplasm related pain (acute) (chronic): Secondary | ICD-10-CM

## 2020-05-14 DIAGNOSIS — M8580 Other specified disorders of bone density and structure, unspecified site: Secondary | ICD-10-CM

## 2020-05-14 DIAGNOSIS — D6859 Other primary thrombophilia: Secondary | ICD-10-CM

## 2020-05-14 DIAGNOSIS — C50411 Malignant neoplasm of upper-outer quadrant of right female breast: Secondary | ICD-10-CM

## 2020-05-14 DIAGNOSIS — Z7901 Long term (current) use of anticoagulants: Secondary | ICD-10-CM | POA: Diagnosis not present

## 2020-05-14 DIAGNOSIS — I700 Atherosclerosis of aorta: Secondary | ICD-10-CM

## 2020-05-14 LAB — CBC WITH DIFFERENTIAL/PLATELET
Abs Immature Granulocytes: 0.04 10*3/uL (ref 0.00–0.07)
Basophils Absolute: 0.1 10*3/uL (ref 0.0–0.1)
Basophils Relative: 1 %
Eosinophils Absolute: 0.2 10*3/uL (ref 0.0–0.5)
Eosinophils Relative: 3 %
HCT: 41.9 % (ref 36.0–46.0)
Hemoglobin: 13.5 g/dL (ref 12.0–15.0)
Immature Granulocytes: 1 %
Lymphocytes Relative: 14 %
Lymphs Abs: 1.1 10*3/uL (ref 0.7–4.0)
MCH: 29.1 pg (ref 26.0–34.0)
MCHC: 32.2 g/dL (ref 30.0–36.0)
MCV: 90.3 fL (ref 80.0–100.0)
Monocytes Absolute: 1 10*3/uL (ref 0.1–1.0)
Monocytes Relative: 12 %
Neutro Abs: 5.5 10*3/uL (ref 1.7–7.7)
Neutrophils Relative %: 69 %
Platelets: 258 10*3/uL (ref 150–400)
RBC: 4.64 MIL/uL (ref 3.87–5.11)
RDW: 15.3 % (ref 11.5–15.5)
WBC: 8 10*3/uL (ref 4.0–10.5)
nRBC: 0 % (ref 0.0–0.2)

## 2020-05-14 LAB — PROTIME-INR
INR: 3.4 — ABNORMAL HIGH (ref 0.8–1.2)
Prothrombin Time: 32.9 seconds — ABNORMAL HIGH (ref 11.4–15.2)

## 2020-05-14 MED ORDER — FULVESTRANT 250 MG/5ML IM SOLN
INTRAMUSCULAR | Status: AC
  Filled 2020-05-14: qty 10

## 2020-05-14 MED ORDER — FULVESTRANT 250 MG/5ML IM SOLN
500.0000 mg | Freq: Once | INTRAMUSCULAR | Status: AC
  Administered 2020-05-14: 500 mg via INTRAMUSCULAR

## 2020-05-14 NOTE — Patient Instructions (Signed)
Fulvestrant injection What is this medicine? FULVESTRANT (ful VES trant) blocks the effects of estrogen. It is used to treat breast cancer. This medicine may be used for other purposes; ask your health care provider or pharmacist if you have questions. COMMON BRAND NAME(S): FASLODEX What should I tell my health care provider before I take this medicine? They need to know if you have any of these conditions:  bleeding disorders  liver disease  low blood counts, like low white cell, platelet, or red cell counts  an unusual or allergic reaction to fulvestrant, other medicines, foods, dyes, or preservatives  pregnant or trying to get pregnant  breast-feeding How should I use this medicine? This medicine is for injection into a muscle. It is usually given by a health care professional in a hospital or clinic setting. Talk to your pediatrician regarding the use of this medicine in children. Special care may be needed. Overdosage: If you think you have taken too much of this medicine contact a poison control center or emergency room at once. NOTE: This medicine is only for you. Do not share this medicine with others. What if I miss a dose? It is important not to miss your dose. Call your doctor or health care professional if you are unable to keep an appointment. What may interact with this medicine?  medicines that treat or prevent blood clots like warfarin, enoxaparin, dalteparin, apixaban, dabigatran, and rivaroxaban This list may not describe all possible interactions. Give your health care provider a list of all the medicines, herbs, non-prescription drugs, or dietary supplements you use. Also tell them if you smoke, drink alcohol, or use illegal drugs. Some items may interact with your medicine. What should I watch for while using this medicine? Your condition will be monitored carefully while you are receiving this medicine. You will need important blood work done while you are taking  this medicine. Do not become pregnant while taking this medicine or for at least 1 year after stopping it. Women of child-bearing potential will need to have a negative pregnancy test before starting this medicine. Women should inform their doctor if they wish to become pregnant or think they might be pregnant. There is a potential for serious side effects to an unborn child. Men should inform their doctors if they wish to father a child. This medicine may lower sperm counts. Talk to your health care professional or pharmacist for more information. Do not breast-feed an infant while taking this medicine or for 1 year after the last dose. What side effects may I notice from receiving this medicine? Side effects that you should report to your doctor or health care professional as soon as possible:  allergic reactions like skin rash, itching or hives, swelling of the face, lips, or tongue  feeling faint or lightheaded, falls  pain, tingling, numbness, or weakness in the legs  signs and symptoms of infection like fever or chills; cough; flu-like symptoms; sore throat  vaginal bleeding Side effects that usually do not require medical attention (report to your doctor or health care professional if they continue or are bothersome):  aches, pains  constipation  diarrhea  headache  hot flashes  nausea, vomiting  pain at site where injected  stomach pain This list may not describe all possible side effects. Call your doctor for medical advice about side effects. You may report side effects to FDA at 1-800-FDA-1088. Where should I keep my medicine? This drug is given in a hospital or clinic and will   not be stored at home. NOTE: This sheet is a summary. It may not cover all possible information. If you have questions about this medicine, talk to your doctor, pharmacist, or health care provider.  2020 Elsevier/Gold Standard (2017-08-24 11:34:41)  

## 2020-05-15 LAB — CANCER ANTIGEN 27.29: CA 27.29: 35.2 U/mL (ref 0.0–38.6)

## 2020-05-18 ENCOUNTER — Other Ambulatory Visit: Payer: Self-pay | Admitting: Oncology

## 2020-05-18 DIAGNOSIS — M84559P Pathological fracture in neoplastic disease, hip, unspecified, subsequent encounter for fracture with malunion: Secondary | ICD-10-CM

## 2020-05-18 MED ORDER — HYDROCODONE-ACETAMINOPHEN 5-325 MG PO TABS: 1.0000 | ORAL_TABLET | Freq: Four times a day (QID) | ORAL | 0 refills | Status: DC | PRN

## 2020-06-11 ENCOUNTER — Inpatient Hospital Stay: Payer: Medicare Other | Attending: Oncology

## 2020-06-11 ENCOUNTER — Inpatient Hospital Stay: Payer: Medicare Other

## 2020-06-12 ENCOUNTER — Telehealth: Payer: Self-pay | Admitting: *Deleted

## 2020-06-12 NOTE — Telephone Encounter (Signed)
Message received at 304 pm and retrieved at 340 pm from pt stating she needs to reschedule missed appointments from 06/11/2020.  She also stated " I got an abscessed tooth and can anyone over there prescribe me clindamycin?"  " Is it ok to get the shots if I got an abscessed tooth ?"  This RN attempted to return call to pt with no answer. Obtained number identified VM.  Detailed message left informing pt appointments will be rescheduled but also per review by covering provider recommends for the patient to contact her primary MD or do an E visit if needed with Urgent Care.  This RN sent a request for rescheduling of missed appointments.

## 2020-06-16 ENCOUNTER — Telehealth: Payer: Self-pay | Admitting: Oncology

## 2020-06-16 NOTE — Telephone Encounter (Signed)
Called pt per 11/4 sch  msg- no answer. Left message for patient with appt date and time  

## 2020-06-23 ENCOUNTER — Ambulatory Visit: Payer: Medicare Other | Admitting: Internal Medicine

## 2020-06-25 ENCOUNTER — Telehealth: Payer: Self-pay | Admitting: *Deleted

## 2020-06-25 NOTE — Telephone Encounter (Signed)
Pt left VM stating " I never got a call about rescheduling the injection appointment I missed " , " I am scheduled on 2/10 for my next injection- should I just wait until then - also I missed some of my synthroid doses "  Noted rescheduling request was sent - scheduling left a message on her VM per no answer when they called on 06/12/2020.  This RN called pt - obtained number identified VM- message left requesting return call.

## 2020-07-03 ENCOUNTER — Encounter: Payer: Self-pay | Admitting: Podiatry

## 2020-07-03 ENCOUNTER — Ambulatory Visit (INDEPENDENT_AMBULATORY_CARE_PROVIDER_SITE_OTHER): Payer: Medicare Other | Admitting: Podiatry

## 2020-07-03 ENCOUNTER — Ambulatory Visit: Payer: Medicare Other | Admitting: Podiatry

## 2020-07-03 ENCOUNTER — Other Ambulatory Visit: Payer: Self-pay

## 2020-07-03 DIAGNOSIS — M79675 Pain in left toe(s): Secondary | ICD-10-CM

## 2020-07-03 DIAGNOSIS — B351 Tinea unguium: Secondary | ICD-10-CM

## 2020-07-03 DIAGNOSIS — M79674 Pain in right toe(s): Secondary | ICD-10-CM | POA: Diagnosis not present

## 2020-07-03 DIAGNOSIS — D689 Coagulation defect, unspecified: Secondary | ICD-10-CM

## 2020-07-09 ENCOUNTER — Inpatient Hospital Stay: Payer: Medicare Other

## 2020-07-09 ENCOUNTER — Inpatient Hospital Stay (HOSPITAL_BASED_OUTPATIENT_CLINIC_OR_DEPARTMENT_OTHER): Payer: Medicare Other | Admitting: Adult Health

## 2020-07-09 ENCOUNTER — Inpatient Hospital Stay: Payer: Medicare Other | Attending: Oncology

## 2020-07-09 ENCOUNTER — Encounter: Payer: Self-pay | Admitting: Adult Health

## 2020-07-09 ENCOUNTER — Telehealth: Payer: Self-pay | Admitting: *Deleted

## 2020-07-09 ENCOUNTER — Other Ambulatory Visit: Payer: Self-pay

## 2020-07-09 ENCOUNTER — Other Ambulatory Visit: Payer: Self-pay | Admitting: Adult Health

## 2020-07-09 VITALS — BP 158/68 | HR 81 | Temp 98.4°F | Resp 18 | Ht 64.0 in | Wt 225.8 lb

## 2020-07-09 DIAGNOSIS — Z7901 Long term (current) use of anticoagulants: Secondary | ICD-10-CM | POA: Insufficient documentation

## 2020-07-09 DIAGNOSIS — Z5111 Encounter for antineoplastic chemotherapy: Secondary | ICD-10-CM | POA: Insufficient documentation

## 2020-07-09 DIAGNOSIS — Z800 Family history of malignant neoplasm of digestive organs: Secondary | ICD-10-CM | POA: Insufficient documentation

## 2020-07-09 DIAGNOSIS — Z79899 Other long term (current) drug therapy: Secondary | ICD-10-CM | POA: Insufficient documentation

## 2020-07-09 DIAGNOSIS — I700 Atherosclerosis of aorta: Secondary | ICD-10-CM

## 2020-07-09 DIAGNOSIS — Z8249 Family history of ischemic heart disease and other diseases of the circulatory system: Secondary | ICD-10-CM | POA: Diagnosis not present

## 2020-07-09 DIAGNOSIS — C50411 Malignant neoplasm of upper-outer quadrant of right female breast: Secondary | ICD-10-CM | POA: Insufficient documentation

## 2020-07-09 DIAGNOSIS — Z9221 Personal history of antineoplastic chemotherapy: Secondary | ICD-10-CM | POA: Diagnosis not present

## 2020-07-09 DIAGNOSIS — I82401 Acute embolism and thrombosis of unspecified deep veins of right lower extremity: Secondary | ICD-10-CM

## 2020-07-09 DIAGNOSIS — D6859 Other primary thrombophilia: Secondary | ICD-10-CM

## 2020-07-09 DIAGNOSIS — M84559P Pathological fracture in neoplastic disease, hip, unspecified, subsequent encounter for fracture with malunion: Secondary | ICD-10-CM | POA: Diagnosis not present

## 2020-07-09 DIAGNOSIS — M84659P Pathological fracture in other disease, hip, unspecified, subsequent encounter for fracture with malunion: Secondary | ICD-10-CM

## 2020-07-09 DIAGNOSIS — C7951 Secondary malignant neoplasm of bone: Secondary | ICD-10-CM | POA: Diagnosis not present

## 2020-07-09 DIAGNOSIS — I10 Essential (primary) hypertension: Secondary | ICD-10-CM | POA: Diagnosis not present

## 2020-07-09 DIAGNOSIS — M8580 Other specified disorders of bone density and structure, unspecified site: Secondary | ICD-10-CM

## 2020-07-09 DIAGNOSIS — Z8042 Family history of malignant neoplasm of prostate: Secondary | ICD-10-CM | POA: Diagnosis not present

## 2020-07-09 DIAGNOSIS — Z170 Estrogen receptor positive status [ER+]: Secondary | ICD-10-CM | POA: Insufficient documentation

## 2020-07-09 DIAGNOSIS — M899 Disorder of bone, unspecified: Secondary | ICD-10-CM

## 2020-07-09 DIAGNOSIS — Z86718 Personal history of other venous thrombosis and embolism: Secondary | ICD-10-CM | POA: Insufficient documentation

## 2020-07-09 DIAGNOSIS — M84459A Pathological fracture, hip, unspecified, initial encounter for fracture: Secondary | ICD-10-CM

## 2020-07-09 DIAGNOSIS — E039 Hypothyroidism, unspecified: Secondary | ICD-10-CM | POA: Diagnosis not present

## 2020-07-09 DIAGNOSIS — N390 Urinary tract infection, site not specified: Secondary | ICD-10-CM

## 2020-07-09 LAB — URINALYSIS, COMPLETE (UACMP) WITH MICROSCOPIC
Bilirubin Urine: NEGATIVE
Glucose, UA: NEGATIVE mg/dL
Ketones, ur: NEGATIVE mg/dL
Nitrite: POSITIVE — AB
Protein, ur: NEGATIVE mg/dL
Specific Gravity, Urine: 1.015 (ref 1.005–1.030)
pH: 6 (ref 5.0–8.0)

## 2020-07-09 LAB — CMP (CANCER CENTER ONLY)
ALT: 13 U/L (ref 0–44)
AST: 12 U/L — ABNORMAL LOW (ref 15–41)
Albumin: 3.9 g/dL (ref 3.5–5.0)
Alkaline Phosphatase: 121 U/L (ref 38–126)
Anion gap: 7 (ref 5–15)
BUN: 15 mg/dL (ref 8–23)
CO2: 29 mmol/L (ref 22–32)
Calcium: 10.7 mg/dL — ABNORMAL HIGH (ref 8.9–10.3)
Chloride: 105 mmol/L (ref 98–111)
Creatinine: 0.86 mg/dL (ref 0.44–1.00)
GFR, Estimated: >60 mL/min (ref >60)
Glucose, Bld: 119 mg/dL — ABNORMAL HIGH (ref 70–99)
Potassium: 4.1 mmol/L (ref 3.5–5.1)
Sodium: 141 mmol/L (ref 135–145)
Total Bilirubin: 0.4 mg/dL (ref 0.3–1.2)
Total Protein: 6.8 g/dL (ref 6.5–8.1)

## 2020-07-09 LAB — CBC WITH DIFFERENTIAL/PLATELET
Abs Immature Granulocytes: 0.04 10*3/uL (ref 0.00–0.07)
Basophils Absolute: 0.1 10*3/uL (ref 0.0–0.1)
Basophils Relative: 1 %
Eosinophils Absolute: 0.2 10*3/uL (ref 0.0–0.5)
Eosinophils Relative: 3 %
HCT: 43.5 % (ref 36.0–46.0)
Hemoglobin: 13.6 g/dL (ref 12.0–15.0)
Immature Granulocytes: 1 %
Lymphocytes Relative: 12 %
Lymphs Abs: 0.9 10*3/uL (ref 0.7–4.0)
MCH: 28.5 pg (ref 26.0–34.0)
MCHC: 31.3 g/dL (ref 30.0–36.0)
MCV: 91 fL (ref 80.0–100.0)
Monocytes Absolute: 0.8 10*3/uL (ref 0.1–1.0)
Monocytes Relative: 11 %
Neutro Abs: 5.6 10*3/uL (ref 1.7–7.7)
Neutrophils Relative %: 72 %
Platelets: 271 10*3/uL (ref 150–400)
RBC: 4.78 MIL/uL (ref 3.87–5.11)
RDW: 14.8 % (ref 11.5–15.5)
WBC: 7.7 10*3/uL (ref 4.0–10.5)
nRBC: 0 % (ref 0.0–0.2)

## 2020-07-09 LAB — PROTIME-INR
INR: 3.7 — ABNORMAL HIGH (ref 0.8–1.2)
Prothrombin Time: 35.3 seconds — ABNORMAL HIGH (ref 11.4–15.2)

## 2020-07-09 MED ORDER — HYDROCODONE-ACETAMINOPHEN 5-325 MG PO TABS: 1.0000 | ORAL_TABLET | Freq: Four times a day (QID) | ORAL | 0 refills | Status: DC | PRN

## 2020-07-09 MED ORDER — FULVESTRANT 250 MG/5ML IM SOLN
INTRAMUSCULAR | Status: AC
  Filled 2020-07-09: qty 10

## 2020-07-09 MED ORDER — FULVESTRANT 250 MG/5ML IM SOLN
500.0000 mg | Freq: Once | INTRAMUSCULAR | Status: AC
  Administered 2020-07-09: 500 mg via INTRAMUSCULAR

## 2020-07-09 MED ORDER — NITROFURANTOIN MONOHYD MACRO 100 MG PO CAPS: 100.0000 mg | ORAL_CAPSULE | Freq: Two times a day (BID) | ORAL | 0 refills | Status: DC

## 2020-07-09 NOTE — Progress Notes (Signed)
Alicia Vasquez  Telephone:([phone]) [phone] Fax:(336) [phone]     ID: Alicia Vasquez OB: July 21, 1940  MR#: [id number]  GBT#:[id number]  Patient Care Team: [name], MD as PCP - General (Family Medicine) Philemon Kingdom, MD as Consulting Physician (Internal Medicine) Caprice Renshaw, MD as Referring Physician (Internal Medicine) Magrinat, [name], MD as Consulting Physician (Oncology) [name], DDS as Consulting Physician (Dentistry) Regal, Tamala Fothergill, DPM as Consulting Physician (Podiatry) OTHER MD:   CHIEF COMPLAINT: Estrogen receptor positive breast cancer  CURRENT TREATMENT:  Fulvestrant; warfarin   INTERVAL HISTORY: Alicia Vasquez returns today for follow-up and treatment of her metastatic estrogen receptor positive breast cancer  She continues on fulvestrant.  She is tolerating this well. She tells me she feels tired a few days after each treatment but otherwise has no symptoms to report  She says she is taking Warfarin daily and is not having any easy bruising or bleeding. She was switched from 31m alternating with 7.538mto 1020mlternative with 7.5 mg.  Her INR today is 3.7.    She underwent left diagnostic mammography with tomography and left breast ultrasonography at [facility] 01/28/2020 showing: breast density category C; 1 cm left breast mass at 3:30 consistent with benign simple cyst.   REVIEW OF SYSTEMS: Alicia Vasquez to have pain particularly in her back. She uses a very stable dose of hydrocodone acetaminophen for this. That allows her to function more normally.  She has been constipated on occasion and takes miralax for this which works for her.    She notes she had difficulty with urinalysis and feels like her urine is still infected.  She has taken oral antibiotics and also went to her PCP and received 5 rocephin injections.    Alicia Vasquez notes a mild pain across her abdomen she thinks is related to stress from her current living situation  with [name] who she thinks may be stealing from her.  Her medications have not gone missing.    COVID 19 VACCINATION STATUS:    BREAST CANCER HISTORY: From doctor Alicia Vasquez's intake node 07/24/2013:  "[age]. female. Who presented with SOB and had a CT chest performed that revealed a right breast mass. Mammogram/ultrsound on 2/13 showed a mass in the 11 o'clock position in the right breast measuring 1.5 cm. Also noted was a right axillary LN measuring 1.6 cm. MRI not performed. Biopsy of mass and lymph done. Mass pathology [[id number] 07/11/2013] invasive mammary carcinoma with mammary carcinoma in situ, grade I, ER+ 100%, PR+ 100% her2neu-, Ki-67 17%. Lymph node + for metastatic carcinoma."  [On 09/09/2013 the patient underwent right lumpectomy and sentinel lymph node sampling. This showed ([id number] [id number] invasive ductal carcinoma, grade 1, the largest lesion measuring 1.8 cm, the second lesion 1.2 cm. One of 4 sentinel lymph nodes was positive, with extracapsular extension. Margins were positive. HER-2 was repeated and was again negative. Further surgery 09/16/2013 obtained clear margins.  Her subsequent history is as detailed below   PAST MEDICAL HISTORY: Past Medical History:  Diagnosis Date  . Allergy   . Anxiety   . Arthritis   . Blood transfusion without reported diagnosis   . Breast cancer [place]   Invasive Mammary Carcinoma  . DVT (deep vein thrombosis) in pregnancy   . Hypertension   . Hypothyroid   . [place]ast[place]   hip  . Personal history of radiation therapy   .  Pneumonia   . Radiation 11/21/13-01/07/14   Right Breast/Supraclavicular    PAST SURGICAL HISTORY: Past Surgical History:  Procedure Laterality Date  . BREAST LUMPECTOMY Left 2015  . BREAST LUMPECTOMY WITH RADIOACTIVE SEED LOCALIZATION Right 09/09/2013   Procedure: BREAST LUMPECTOMY WITH RADIOACTIVE SEED LOCALIZATION WITH  AXILLARY NODE EXCISION;  Surgeon: [name], MD;  Location: [place];  Service: General;  Laterality: Right;  . DENTAL SURGERY  04/19/2012   13 TEETH REMOVED  . DILATION AND CURETTAGE OF UTERUS    . ORIF PERIPROSTHETIC FRACTURE Left 01/31/2017   Procedure: REVISION and OPEN REDUCTION INTERNAL FIXATION (ORIF) PERIPROSTHETIC FRACTURE LEFT HIP;  Surgeon: Paralee Cancel, MD;  Location[facility];  Service: Orthopedics;  Laterality: Left;  120 mins  . RE-EXCISION OF BREAST LUMPECTOMY Right 09/24/2013   Procedure: RE-EXCISION OF RIGHT BREAST LUMPECTOMY;  Surgeon: [name], MD;  Location: [place];  Service: General;  Laterality: Right;  . TOTAL HIP ARTHROPLASTY Left 01/16/2017   Procedure: TOTAL HIP ARTHROPLASTY POSTERIOR;  Surgeon: Paralee Cancel, MD;  [facility];  Service: Orthopedics;  Laterality: Left;    FAMILY HISTORY Family History  Problem Relation Age of Onset  . Heart disease Brother   . Colon cancer Brother   . Prostate cancer Brother    the patient's father died at the age of 6 after an automobile accident. The patient's mother died at the age of 93. She was a Marine scientist here in [name] in the old [facility]. She was infected with polio and was confined to a wheelchair for a good part of her life. She eventually died of pneumonia. The patient had one brother, who died with prostate cancer. She had no sisters. There is no history of breast or ovarian cancer in the family.   GYNECOLOGIC HISTORY:  Menarche age 83, first live birth age 56, the patient is GX P1. She went through the change of life at age 8. 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 She is widowed. Currently she is staying with her friend [name], 60 years old, who is a retired Radio producer.   The patient's son [name] lived in [place] but more recently he moved into [place] prior  home. He works in Engineer, technical sales. The patient tells me if [name] dies she would be moving in with her son. Recall her sons wife died from cancer some years ago.  The patient has no grandchildren. She is a Tourist information centre manager but currently attends a [employer] with her friend [name]    ADVANCED DIRECTIVES: Not in place   HEALTH MAINTENANCE: Social History   Tobacco Use  . Smoking status: Never Smoker  . Smokeless tobacco: Never Used  Substance Use Topics  . Alcohol use: No    Alcohol/week: 0.0 standard drinks  . Drug use: No     Colonoscopy: Never  PAP:  Bone density: 09/20/2016 showed a T score of -2.2  Lipid panel:  Allergies  Allergen Reactions  . Anesthetics, Amide Hypertension  . Benadryl [Diphenhydramine Hcl] Other (See Comments)    Dizziness  . Carbocaine [Mepivacaine Hcl] Hypertension  . Codeine Other (See Comments)    Dizziness  . Epinephrine Hypertension  . Sulfa Antibiotics Other (See Comments)    dizziness  . Latex Other (See Comments)    Blisters in mouth  . Tramadol     Sedation.   Alicia Vasquez Kitchen Penicillins Rash  Has patient had a PCN reaction causing immediate rash, facial/tongue/throat swelling, SOB or lightheadedness with hypotension: Unknown Has patient had a PCN reaction causing severe rash involving mucus membranes or skin necrosis: Unknown Has patient had a PCN reaction that required hospitalization: Unknown Has patient had a PCN reaction occurring within the last 10 years: Unknown If all of the above answers are "NO", then may proceed with Cephalosporin use.     Current Outpatient Medications  Medication Sig Dispense Refill  . B-D TB SYRINGE 1CC/27GX1/2" 27G X 1/2" 1 ML MISC USE TO DRAW UP 1 ML LIDOCAINE AND INJECT INTO CEFTRIAXONE VIAL FOR DILUTION. CHANGE TO 25G NEEDLE FOR ADMINISTRATION    . Cholecalciferol (VITAMIN D3) 5000 UNITS TABS Take 5,000 Units by mouth daily.     Alicia Vasquez Kitchen ketoconazole (NIZORAL) 2 % cream Apply 1 application topically daily. 15 g 0  . lidocaine  (XYLOCAINE) 1 % (with preservative) injection SMARTSIG:Rectally    . NEEDLE, DISP, 25 G 25G X 1-1/2" MISC Please use to administer rocephin injection 15 each 0  . SYNTHROID 125 MCG tablet TAKE 1 TABLET BY MOUTH EVERY DAY BEFORE BREAKFAST 90 tablet 4  . Syringe, Disposable, 5 ML MISC Please use to inject rocephin injection 5 each 0  . vitamin C (ASCORBIC ACID) 250 MG tablet Take 250 mg by mouth 3 (three) times daily.    Alicia Vasquez Kitchen warfarin (COUMADIN) 5 MG tablet Take 7.5 mg daily. 270 tablet 1  . HYDROcodone-acetaminophen (NORCO/VICODIN) 5-325 MG tablet Take 1 tablet by mouth every 6 (six) hours as needed for moderate pain. 120 tablet 0   No current facility-administered medications for this visit.   Facility-Administered Medications Ordered in Other Visits  Medication Dose Route Frequency Provider Last Rate Last Admin  . fulvestrant (FASLODEX) injection 500 mg  500 mg Intramuscular Once Magrinat, [name], MD        OBJECTIVE: white woman examined in a wheelchair  Vitals:   07/09/20 1131  BP: (!) 158/68  Pulse: 81  Resp: 18  Temp: 98.4 F (36.9 C)  SpO2: 94%   Wt Readings from Last 3 Encounters:  07/09/20 225 lb 12.8 oz (102.4 kg)  04/27/20 220 lb (99.8 kg)  04/15/20 228 lb 11.2 oz (103.7 kg)   Body mass index is 38.76 kg/m.    ECOG FS:2 - Symptomatic, <50% confined to bed  GENERAL: Patient is a tired woman in no apparent distress examined in wheelchair. HEENT:  Sclerae anicteric. Mask in place.  Neck is supple.  NODES:  No cervical, supraclavicular, or axillary lymphadenopathy palpated.  BREAST EXAM:  Deferred. LUNGS:  Clear to auscultation bilaterally.  No wheezes or rhonchi. HEART:  Regular rate and rhythm. No murmur appreciated. ABDOMEN:  Soft, nontender.  Positive, normoactive bowel sounds. No organomegaly palpated. MSK:  No focal spinal tenderness to palpation.  EXTREMITIES:  No peripheral edema.   SKIN:  Clear with no obvious rashes or skin changes. No nail  dyscrasia. NEURO:  Nonfocal. Well oriented.  Appropriate affect.    LAB RESULTS:  CMP     Component Value Date/Time   NA 141 08/07/2019 1305   NA 141 05/16/2017 1416   K 4.4 08/07/2019 1305   K 3.8 05/16/2017 1416   CL 105 08/07/2019 1305   CO2 28 08/07/2019 1305   CO2 27 05/16/2017 1416   GLUCOSE 153 (H) 08/07/2019 1305   GLUCOSE 140 05/16/2017 1416   BUN 13 08/07/2019 1305   BUN 13.2 05/16/2017 1416   CREATININE 0.89 08/07/2019 1305  CREATININE 0.77 03/21/2018 1417   CREATININE 0.80 09/22/2017 1555   CREATININE 0.8 05/16/2017 1416   CALCIUM 10.9 (H) 08/07/2019 1305   CALCIUM 11.0 (H) 05/16/2017 1416   PROT 6.8 08/07/2019 1305   PROT 6.9 05/16/2017 1416   ALBUMIN 3.8 08/07/2019 1305   ALBUMIN 3.6 05/16/2017 1416   AST 13 (L) 08/07/2019 1305   AST 14 (L) 03/21/2018 1417   AST 13 05/16/2017 1416   ALT 14 08/07/2019 1305   ALT 14 03/21/2018 1417   ALT <6 05/16/2017 1416   ALKPHOS 157 (H) 08/07/2019 1305   ALKPHOS 130 05/16/2017 1416   BILITOT 0.3 08/07/2019 1305   BILITOT 0.3 03/21/2018 1417   BILITOT 0.31 05/16/2017 1416   GFRNONAA >60 08/07/2019 1305   GFRNONAA >60 03/21/2018 1417   GFRNONAA 75 08/19/2015 1602   GFRAA >60 08/07/2019 1305   GFRAA >60 03/21/2018 1417   GFRAA 87 08/19/2015 1602    I No results found for: SPEP  Lab Results  Component Value Date   WBC 7.7 07/09/2020   NEUTROABS 5.6 07/09/2020   HGB 13.6 07/09/2020   HCT 43.5 07/09/2020   MCV 91.0 07/09/2020   PLT 271 07/09/2020      Chemistry      Component Value Date/Time   NA 141 08/07/2019 1305   NA 141 05/16/2017 1416   K 4.4 08/07/2019 1305   K 3.8 05/16/2017 1416   CL 105 08/07/2019 1305   CO2 28 08/07/2019 1305   CO2 27 05/16/2017 1416   BUN 13 08/07/2019 1305   BUN 13.2 05/16/2017 1416   CREATININE 0.89 08/07/2019 1305   CREATININE 0.77 03/21/2018 1417   CREATININE 0.80 09/22/2017 1555   CREATININE 0.8 05/16/2017 1416      Component Value Date/Time   CALCIUM 10.9  (H) 08/07/2019 1305   CALCIUM 11.0 (H) 05/16/2017 1416   ALKPHOS 157 (H) 08/07/2019 1305   ALKPHOS 130 05/16/2017 1416   AST 13 (L) 08/07/2019 1305   AST 14 (L) 03/21/2018 1417   AST 13 05/16/2017 1416   ALT 14 08/07/2019 1305   ALT 14 03/21/2018 1417   ALT <6 05/16/2017 1416   BILITOT 0.3 08/07/2019 1305   BILITOT 0.3 03/21/2018 1417   BILITOT 0.31 05/16/2017 1416       No results found for: LABCA2  No components found for: LABCA125  Recent Labs  Lab 07/09/20 1054  INR 3.7*    Urinalysis    Component Value Date/Time   COLORURINE YELLOW 04/15/2020 1243   APPEARANCEUR CLOUDY (A) 04/15/2020 1243   LABSPEC 1.010 04/15/2020 1243   LABSPEC 1.010 09/22/2016 1553   PHURINE 8.0 04/15/2020 1243   GLUCOSEU NEGATIVE 04/15/2020 1243   GLUCOSEU Negative 09/22/2016 1553   HGBUR NEGATIVE 04/15/2020 1243   [place] 04/15/2020 1243   BILIRUBINUR negative 02/14/2020 1644   BILIRUBINUR Negative 09/22/2016 1553   KETONESUR NEGATIVE 04/15/2020 1243   PROTEINUR NEGATIVE 04/15/2020 1243   UROBILINOGEN 0.2 02/14/2020 1644   UROBILINOGEN 0.2 11/15/2016 1358   UROBILINOGEN 0.2 09/22/2016 1553   NITRITE NEGATIVE 04/15/2020 1243   LEUKOCYTESUR TRACE (A) 04/15/2020 1243   LEUKOCYTESUR Negative 09/22/2016 1553    STUDIES: No results found.   ASSESSMENT: 80 y.o. [place] woman status post right breast upper outer quadrant lumpectomy and sentinel lymph node sampling 09/09/2013 for an mpT1c pN1a, stage IIA invasive ductal carcinoma, estrogen and progesterone receptor both 100% positive with strong staining intensity, MIB-1 of 17% and no HER-2 amplification  (1) additional  surgery for margin clearance 09/16/2013 obtained negative margins  (2) Oncotype DX recurrence score of 4 predicts a risk of outside the breast recurrence within 10 years of 7% if the patient's only systemic therapy is tamoxifen for 5 years. It also predicts no benefit from chemotherapy  (3) adjuvant  radiation completed 01/07/2014  (4) anastrozole started 02/27/2014 stopped within 2 weeks because of arm swelling.   (a) bone density April 2016 showed osteopenia, with a t-score of -1.6  (b) anastrozole resumed 12/17/2015  (c) Bone density 09/20/2016 showed a T score of -2.2  (5) history of left lower extremity DVT 11/23/2012, initially on rivaroxaban, which caused chest pain, switch to Coumadin July 2014  (b) coumadin dose increased to 7.5/10 mg alternating days as of 06/13/2017   METASTATIC DISEASE: August 2018 (6) status post left total hip replacement 01/16/2017 for estrogen receptor positive adenocarcinoma.  (a) CA 27-29 was 46.4 as of 04/18/2017  (b) chest CT scan 05/02/2017 shows no lung or liver lesion concern; it does show aortic atherosclerosis  (c) baseline bone scan 05/02/2017 was negative  (d) PET scan 09/12/2017 shows no active disease, including bone  (e) PET scan and CT chest on 05/29/2018 show no active disease  (f) chest CT and bone scan 03/18/2019 showed no evidence of active disease  (7) fulvestrant started 04/18/2017  (8) denosumab/Xgeva started 05/16/2017  (a) changed to every 12-weeks after 10/03/2017 dose   (b) held starting with 06/13/2018 dose due to dental concerns  (9) unprovoked deep vein thrombosis involving the left posterior tibial v documented 11/23/2012, resolved on repeat 06/27/2013 and not recurrent on multiple Dopplers since, most recent 03/06/2018  (a) on chronic anticoagulation with warfarin given ongoing risks (stage IV breast cancer, relative immobility secondary to claudication   PLAN:  Alicia Vasquez is doing moderately well today.  She will continue on Fulvestrant every 4 weeks.  She will have scans for restaging prior to her April visit.  Should her pain worsen, or change she will let us know and we will expedite them.    I am unsure what to make of her dysuria and the fact that it hasn't really improved.  We will collect another urine sample and ?  Refer to urology.    She and I discussed the covid 19 vaccine today and she plans on scheduling this next week.     She will have her next Faslodex dose in 4 weeks. We will continue to see her on an every 48-monthbasis. She knows to call for any other issue that may develop before the return visit here.  Total encounter time 30 minutes.*Wilber Bihari NP 07/09/20 12:10 PM Medical Oncology and [facility] [place] [id number]. [phone]   Fax. [phone]  *Total Encounter Time as defined by the Centers for Medicare and Medicaid Services includes, in addition to the face-to-face time of a patient visit (documented in the note above) non-face-to-face time: obtaining and reviewing outside history, ordering and reviewing medications, tests or procedures, care coordination (communications with other health care professionals or caregivers) and documentation in the medical record.

## 2020-07-09 NOTE — Progress Notes (Signed)
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 Patient is on long term blood thinner Coumadin..  She voices no new pedal concerns on today's visit.  Review of Systems: Negative except as noted in the HPI.  Past Medical History:  Diagnosis Date  . Allergy   . Anxiety   . Arthritis   . Blood transfusion without reported diagnosis   . Breast cancer ([place]) 07/12/2013   Invasive Mammary Carcinoma  . DVT (deep vein thrombosis) in pregnancy   . Hypertension   . Hypothyroid   . Metastatic cancer to bone ([place]) dx'd 12/2016   hip  . Personal history of radiation therapy   . Pneumonia   . Radiation 11/21/13-01/07/14   Right Breast/Supraclavicular   Past Surgical History:  Procedure Laterality Date  . BREAST LUMPECTOMY Left 2015  . BREAST LUMPECTOMY WITH RADIOACTIVE SEED LOCALIZATION Right 09/09/2013   Procedure: BREAST LUMPECTOMY WITH RADIOACTIVE SEED LOCALIZATION WITH AXILLARY NODE EXCISION;  Surgeon: [name], MD;  Location: [place];  Service: General;  Laterality: Right;  . DENTAL SURGERY  04/19/2012   13 TEETH REMOVED  . DILATION AND CURETTAGE OF UTERUS    . ORIF PERIPROSTHETIC FRACTURE Left 01/31/2017   Procedure: REVISION and OPEN REDUCTION INTERNAL FIXATION (ORIF) PERIPROSTHETIC FRACTURE LEFT HIP;  Surgeon: Paralee Cancel, MD;  [facility];  Service: Orthopedics;  Laterality: Left;  120 mins  . RE-EXCISION OF BREAST LUMPECTOMY Right 09/24/2013   Procedure: RE-EXCISION OF RIGHT BREAST LUMPECTOMY;  Surgeon: [name], MD;  Location: [place];  Service: General;  Laterality: Right;  . TOTAL HIP ARTHROPLASTY Left 01/16/2017   Procedure: TOTAL HIP ARTHROPLASTY POSTERIOR;  Surgeon: Paralee Cancel, MD;  [facility];  Service: Orthopedics;  Laterality: Left;    Current Outpatient Medications:  .  B-D  TB SYRINGE 1CC/27GX1/2" 27G X 1/2" 1 ML MISC, USE TO DRAW UP 1 ML LIDOCAINE AND INJECT INTO CEFTRIAXONE VIAL FOR DILUTION. CHANGE TO 25G NEEDLE FOR ADMINISTRATION, Disp: , Rfl:  .  Cholecalciferol (VITAMIN D3) 5000 UNITS TABS, Take 5,000 Units by mouth daily. , Disp: , Rfl:  .  HYDROcodone-acetaminophen (NORCO/VICODIN) 5-325 MG tablet, Take 1 tablet by mouth every 6 (six) hours as needed for moderate pain., Disp: 120 tablet, Rfl: 0 .  ketoconazole (NIZORAL) 2 % cream, Apply 1 application topically daily., Disp: 15 g, Rfl: 0 .  lidocaine (XYLOCAINE) 1 % (with preservative) injection, SMARTSIG:Rectally, Disp: , Rfl:  .  NEEDLE, DISP, 25 G 25G X 1-1/2" MISC, Please use to administer rocephin injection, Disp: 15 each, Rfl: 0 .  nitrofurantoin, macrocrystal-monohydrate, (MACROBID) 100 MG capsule, Take 1 capsule (100 mg total) by mouth 2 (two) times daily., Disp: 14 capsule, Rfl: 0 .  SYNTHROID 125 MCG tablet, TAKE 1 TABLET BY MOUTH EVERY DAY BEFORE BREAKFAST, Disp: 90 tablet, Rfl: 4 .  Syringe, Disposable, 5 ML MISC, Please use to inject rocephin injection, Disp: 5 each, Rfl: 0 .  vitamin C (ASCORBIC ACID) 250 MG tablet, Take 250 mg by mouth 3 (three) times daily., Disp: , Rfl:  .  warfarin (COUMADIN) 5 MG tablet, Take 7.5 mg daily., Disp: 270 tablet, Rfl: 1 Allergies  Allergen Reactions  . Anesthetics, Amide Hypertension  . Benadryl [Diphenhydramine Hcl] Other (See Comments)    Dizziness  . Carbocaine [Mepivacaine Hcl] Hypertension  . Codeine Other (See Comments)  Dizziness  . Epinephrine Hypertension  . Sulfa Antibiotics Other (See Comments)    dizziness  . Latex Other (See Comments)    Blisters in mouth  . Tramadol     Sedation.   [place] Kitchen Penicillins Rash    Has patient had a PCN reaction causing immediate rash, facial/tongue/throat swelling, SOB or lightheadedness with hypotension: Unknown Has patient had a PCN reaction causing severe rash involving mucus membranes or skin necrosis:  Unknown Has patient had a PCN reaction that required hospitalization: Unknown Has patient had a PCN reaction occurring within the last 10 years: Unknown If all of the above answers are "NO", then may proceed with Cephalosporin use.    Social History   Occupational History  . Not on file  Tobacco Use  . Smoking status: Never Smoker  . Smokeless tobacco: Never Used  Substance and Sexual Activity  . Alcohol use: No    Alcohol/week: 0.0 standard drinks  . Drug use: No  . Sexual activity: Never    Objective:   Constitutional Alicia Vasquez is a pleasant 80 y.o. Caucasian female, morbidly obese in NAD. AAO x 3.   Vascular  No cyanosis or clubbing noted. Pedal hair growth diminished b/l. Capillary fill time to digits <3 seconds b/l lower extremities. Faintly palpable pedal pulses b/l. Pedal hair present. Lower extremity skin temperature gradient within normal limits.  Neurologic Normal speech. Oriented to person, place, and time. Protective sensation diminished with 10g monofilament b/l. Vibratory sensation intact b/l. Proprioception intact bilaterally.  Dermatologic Pedal skin with normal turgor, texture and tone bilaterally. No open wounds bilaterally. No interdigital macerations bilaterally. Toenails 1-5 b/l elongated, discolored, dystrophic, thickened, crumbly with subungual debris and tenderness to dorsal palpation. Incurvated nailplate left great toe medial border(s) with tenderness to palpation. No erythema, no edema, no drainage noted.  Orthopedic: Normal muscle strength 5/5 to all lower extremity muscle groups bilaterally. No pain crepitus or joint limitation noted with ROM b/l. No bony tenderness.    Radiographs: None Assessment:   1. Pain due to onychomycosis of toenails of both feet   2. 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  Onychomycosis with pain -Nails palliatively debridement as below -Educated on  self-care  Procedure: Nail Debridement Rationale: Pain Type of Debridement: manual, sharp debridement. Instrumentation: Nail nipper, rotary burr. Number of Nails: 10 -Examined patient. -No new findings. No new orders. -Toenails 1-5 b/l were debrided in length and girth with sterile nail nippers and dremel without iatrogenic bleeding.  -Offending nail border debrided and curretaged L hallux utilizing sterile nail nipper and currette. Border(s) cleansed with alcohol and triple antibiotic ointment applied. Patient instructed to apply Neosporin to L hallux once daily for 7 days. -Patient to report any pedal injuries to medical professional immediately. -Patient/POA to call should there be question/concern in the interim.  Return in about 3 months (around 09/30/2020).  Marzetta Board, DPM

## 2020-07-09 NOTE — Telephone Encounter (Signed)
Per Annabelle Harman, called to make pt aware that ABT was sent to [facility] for pick up for UTI. Also referral was placed to urologist. Pt verbalized understanding.

## 2020-07-10 ENCOUNTER — Telehealth: Payer: Self-pay | Admitting: Adult Health

## 2020-07-10 ENCOUNTER — Telehealth: Payer: Self-pay | Admitting: *Deleted

## 2020-07-10 LAB — CANCER ANTIGEN 27.29: CA 27.29: 54.7 U/mL — ABNORMAL HIGH (ref 0.0–38.6)

## 2020-07-10 NOTE — Telephone Encounter (Signed)
No 2/10 los. No changes made to pt's schedule.

## 2020-07-10 NOTE — Telephone Encounter (Signed)
Per Annabelle Harman, scheduled pt for CT scan and bone scan for Feb 25 beginning at 945am. Left message to call back with directions.

## 2020-07-11 LAB — URINE CULTURE: Culture: >=100000 — AB

## 2020-07-13 ENCOUNTER — Inpatient Hospital Stay: Payer: Medicare Other

## 2020-07-13 ENCOUNTER — Telehealth: Payer: Self-pay

## 2020-07-13 ENCOUNTER — Other Ambulatory Visit: Payer: Self-pay

## 2020-07-13 ENCOUNTER — Inpatient Hospital Stay (HOSPITAL_BASED_OUTPATIENT_CLINIC_OR_DEPARTMENT_OTHER): Payer: Medicare Other | Admitting: Medical

## 2020-07-13 VITALS — BP 146/62 | HR 89 | Temp 97.8°F | Resp 16 | Ht 64.0 in | Wt 229.5 lb

## 2020-07-13 DIAGNOSIS — Z7901 Long term (current) use of anticoagulants: Secondary | ICD-10-CM | POA: Diagnosis not present

## 2020-07-13 DIAGNOSIS — Z170 Estrogen receptor positive status [ER+]: Secondary | ICD-10-CM

## 2020-07-13 DIAGNOSIS — C7951 Secondary malignant neoplasm of bone: Secondary | ICD-10-CM

## 2020-07-13 DIAGNOSIS — I82401 Acute embolism and thrombosis of unspecified deep veins of right lower extremity: Secondary | ICD-10-CM

## 2020-07-13 DIAGNOSIS — M25552 Pain in left hip: Secondary | ICD-10-CM

## 2020-07-13 DIAGNOSIS — I10 Essential (primary) hypertension: Secondary | ICD-10-CM | POA: Diagnosis not present

## 2020-07-13 DIAGNOSIS — C50411 Malignant neoplasm of upper-outer quadrant of right female breast: Secondary | ICD-10-CM | POA: Diagnosis not present

## 2020-07-13 DIAGNOSIS — Z5111 Encounter for antineoplastic chemotherapy: Secondary | ICD-10-CM | POA: Diagnosis not present

## 2020-07-13 DIAGNOSIS — E039 Hypothyroidism, unspecified: Secondary | ICD-10-CM | POA: Diagnosis not present

## 2020-07-13 DIAGNOSIS — I825Z9 Chronic embolism and thrombosis of unspecified deep veins of unspecified distal lower extremity: Secondary | ICD-10-CM

## 2020-07-13 LAB — CMP (CANCER CENTER ONLY)
ALT: 13 U/L (ref 0–44)
AST: 13 U/L — ABNORMAL LOW (ref 15–41)
Albumin: 3.8 g/dL (ref 3.5–5.0)
Alkaline Phosphatase: 118 U/L (ref 38–126)
Anion gap: 7 (ref 5–15)
BUN: 17 mg/dL (ref 8–23)
CO2: 27 mmol/L (ref 22–32)
Calcium: 11 mg/dL — ABNORMAL HIGH (ref 8.9–10.3)
Chloride: 107 mmol/L (ref 98–111)
Creatinine: 0.98 mg/dL (ref 0.44–1.00)
GFR, Estimated: 58 mL/min — ABNORMAL LOW (ref >60)
Glucose, Bld: 113 mg/dL — ABNORMAL HIGH (ref 70–99)
Potassium: 4.5 mmol/L (ref 3.5–5.1)
Sodium: 141 mmol/L (ref 135–145)
Total Bilirubin: 0.5 mg/dL (ref 0.3–1.2)
Total Protein: 6.9 g/dL (ref 6.5–8.1)

## 2020-07-13 LAB — PROTIME-INR
INR: 2.5 — ABNORMAL HIGH (ref 0.8–1.2)
Prothrombin Time: 26.2 seconds — ABNORMAL HIGH (ref 11.4–15.2)

## 2020-07-13 LAB — CBC WITH DIFFERENTIAL (CANCER CENTER ONLY)
Abs Immature Granulocytes: 0.04 10*3/uL (ref 0.00–0.07)
Basophils Absolute: 0.1 10*3/uL (ref 0.0–0.1)
Basophils Relative: 1 %
Eosinophils Absolute: 0.3 10*3/uL (ref 0.0–0.5)
Eosinophils Relative: 3 %
HCT: 35.8 % — ABNORMAL LOW (ref 36.0–46.0)
Hemoglobin: 11.3 g/dL — ABNORMAL LOW (ref 12.0–15.0)
Immature Granulocytes: 0 %
Lymphocytes Relative: 12 %
Lymphs Abs: 1.1 10*3/uL (ref 0.7–4.0)
MCH: 28.8 pg (ref 26.0–34.0)
MCHC: 31.6 g/dL (ref 30.0–36.0)
MCV: 91.3 fL (ref 80.0–100.0)
Monocytes Absolute: 1.2 10*3/uL — ABNORMAL HIGH (ref 0.1–1.0)
Monocytes Relative: 13 %
Neutro Abs: 6.5 10*3/uL (ref 1.7–7.7)
Neutrophils Relative %: 71 %
Platelet Count: 253 10*3/uL (ref 150–400)
RBC: 3.92 MIL/uL (ref 3.87–5.11)
RDW: 14.9 % (ref 11.5–15.5)
WBC Count: 9.3 10*3/uL (ref 4.0–10.5)
nRBC: 0 % (ref 0.0–0.2)

## 2020-07-13 NOTE — Telephone Encounter (Signed)
Pt called and left message stating she got a bilateral injections (Fulvestiant) on 07/09/20. Pt stated her left hip was very bruised and swollen, more so than normal. Also pt stated that she barely scratched her right knee and it bleed more than it should have. Pt is on coumadin 7.5 mg po daily. Attempted to contact pt, left message.

## 2020-07-13 NOTE — Progress Notes (Signed)
Symptoms Management Clinic Progress Note   Tomoko Sandra [facility] [id number] 07/10/1940 80 y.o.  Jaicey Sweaney Feltz is managed by Dr. 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 Westergaard continues to be managed by Dr. Jana Hakim and receives monthly Faslodex with her last injection given on 07/09/2020.  She continues to receive monthly injections and will be seen in follow-up on 10/01/2020.  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  Her INR on 07/09/2020 returned at 3.7.  Her INR returned today at 2.5 although she reports that she did not take her Coumadin dose last night given that she was concerned about bleeding.  Left hip pain with a large hematoma: She continues to use hydrocodone for pain.  She was told that she could increase this to 1-1/2 tablets 4 times daily and that she could take 2 tablets at night for now.  She was told to use either ice or heat to her left hip.  I told her that she could use whichever 1 provided most comfort.  Her hemoglobin on 07/09/2020 returned at 13.6 with her hemoglobin returning at 11.3 today.  I explained to her that her drop in her hemoglobin, bruising, swelling, and pain in her left hip were consistent with a hematoma.  I will asked Dr. Jana Hakim if he believes that Ms. Hamre needs to return sooner than her follow-up.  Please see  After Visit Summary for patient specific instructions.  Future Appointments  Date Time Provider [place]  07/24/2020 10:00 AM [facility] 2 [facility]CT [place]  07/24/2020 11:00 AM [facility] INJ 1 [facility] [place]  07/24/2020  2:00 PM [facility]-NM 1 [facility]-NM [place]  08/06/2020 11:15 AM [facility] None  08/06/2020 11:45 AM [facility] [place] FLUSH [facility] None  08/28/2020  3:20 PM Philemon Kingdom, MD [facility] None  09/03/2020 11:00 AM [facility] None  09/03/2020 11:45 AM [facility] [place] FLUSH [facility] None  10/01/2020 11:30 AM [facility] None  10/01/2020 12:00 PM Magrinat, [name], MD [facility] None  10/01/2020 12:30 PM [facility] [place] FLUSH [facility] None  10/16/2020 11:00 AM Marzetta Board, DPM [facility]    No orders of the defined types were placed in this encounter.      Subjective:   Patient ID:  Alicia Vasquez is a 80 y.o. (DOB 1940/07/10) female.  Chief Complaint: No chief complaint on file.   HPI Korrin Waterfield Knowles  is a 79 y.o. female with a diagnosis of a metastatic ER positive malignant neoplasm of the right breast with bone metastasis.  She is followed by Dr. Jana Hakim and receives monthly Faslodex with her last injection given on 07/09/2020.  Additionally she has a history of a DVT and is on long-term anticoagulation with Coumadin.  She reports that she is currently taking 7.5 mg daily.  Her INR on 07/09/2020 returned at 3.7.  Her INR returned today at 2.5 although she reports that she did not  take her Coumadin dose last night given that she was concerned about bleeding.  She presents to the clinic today with left hip pain.  She reports that she had pain after her last Faslodex injection and noted scant blood from the injection site last week.  She has been taking hydrocodone 1 tablet 4 times daily without good relief of her pain.  She is having pain, swelling, and bruising of her left hip.  Labs are completed at the time of  her last visit with her hemoglobin on 07/09/2020 returning at 13.6 with her hemoglobin returning at 11.3 today.   Medications: I have reviewed the patient's current medications.  Allergies:  Allergies  Allergen Reactions  . Anesthetics, Amide Hypertension  . Benadryl [Diphenhydramine Hcl] Other (See Comments)    Dizziness  . Carbocaine [Mepivacaine Hcl] Hypertension  . Codeine Other (See Comments)    Dizziness  . Epinephrine Hypertension  . Sulfa Antibiotics Other (See Comments)    dizziness  . Latex Other (See Comments)    Blisters in mouth  . Tramadol     Sedation.   Marland Kitchen Penicillins Rash    Has patient had a PCN reaction causing immediate rash, facial/tongue/throat swelling, SOB or lightheadedness with hypotension: Unknown Has patient had a PCN reaction causing severe rash involving mucus membranes or skin necrosis: Unknown Has patient had a PCN reaction that required hospitalization: Unknown Has patient had a PCN reaction occurring within the last 10 years: Unknown If all of the above answers are "NO", then may proceed with Cephalosporin use.     Past Medical History:  Diagnosis Date  . Allergy   . Anxiety   . Arthritis   . Blood transfusion without reported diagnosis   . Breast cancer ([place]) 07/12/2013   Invasive Mammary Carcinoma  . DVT (deep vein thrombosis) in pregnancy   . Hypertension   . Hypothyroid   . Metastatic cancer to bone ([place]) dx'd 12/2016   hip  . Personal history of radiation therapy   . Pneumonia   . Radiation 11/21/13-01/07/14   Right Breast/Supraclavicular    Past Surgical History:  Procedure Laterality Date  . BREAST LUMPECTOMY Left 2015  . BREAST LUMPECTOMY WITH RADIOACTIVE SEED LOCALIZATION Right 09/09/2013   Procedure: BREAST LUMPECTOMY WITH RADIOACTIVE SEED LOCALIZATION WITH AXILLARY NODE EXCISION;  Surgeon: [name], MD;  Location: [place];  Service: General;  Laterality: Right;  . DENTAL SURGERY  04/19/2012   13  TEETH REMOVED  . DILATION AND CURETTAGE OF UTERUS    . ORIF PERIPROSTHETIC FRACTURE Left 01/31/2017   Procedure: REVISION and OPEN REDUCTION INTERNAL FIXATION (ORIF) PERIPROSTHETIC FRACTURE LEFT HIP;  Surgeon: Paralee Cancel, MD;  [facility];  Service: Orthopedics;  Laterality: Left;  120 mins  . RE-EXCISION OF BREAST LUMPECTOMY Right 09/24/2013   Procedure: RE-EXCISION OF RIGHT BREAST LUMPECTOMY;  Surgeon: [name], MD;  Location: [facility];  Service: General;  Laterality: Right;  . TOTAL HIP ARTHROPLASTY Left 01/16/2017   Procedure: TOTAL HIP ARTHROPLASTY POSTERIOR;  Surgeon: Paralee Cancel, MD;  [facility];  Service: Orthopedics;  Laterality: Left;    Family History  Problem Relation Age of Onset  . Heart disease Brother   . Colon cancer Brother   . Prostate cancer Brother     Social History   Socioeconomic History  . Marital status: Widowed    Spouse name: Not on file  . Number of children: 1  . Years of education: Not on file  .  Highest education level: Not on file  Occupational History  . Not on file  Tobacco Use  . Smoking status: Never Smoker  . Smokeless tobacco: Never Used  Substance and Sexual Activity  . Alcohol use: No    Alcohol/week: 0.0 standard drinks  . Drug use: No  . Sexual activity: Never  Other Topics Concern  . Not on file  Social History Narrative   Exercise: yard work.   Social Determinants of Health   Financial Resource Strain: Not on file  Food Insecurity: Not on file  Transportation Needs: Not on file  Physical Activity: Not on file  Stress: Not on file  Social Connections: Not on file  Intimate Partner Violence: Not on file    Past Medical History, Surgical history, Social history, and Family history were reviewed and updated as appropriate.   Please see review of systems for further details on the patient's review from today.   Review of Systems:  Review of Systems  Constitutional: Negative for  chills, diaphoresis and fever.  HENT: Negative for trouble swallowing and voice change.   Respiratory: Negative for cough, chest tightness, shortness of breath and wheezing.   Cardiovascular: Negative for chest pain and palpitations.  Gastrointestinal: Negative for abdominal pain, constipation, diarrhea, nausea and vomiting.  Musculoskeletal: Positive for arthralgias, gait problem and joint swelling. Negative for back pain and myalgias.       Swelling, tenderness, and bruising of the left hip.  Neurological: Negative for dizziness, light-headedness and headaches.  Hematological: Bruises/bleeds easily.    Objective:   Physical Exam:  BP (!) 146/62 (BP Location: Right Arm, Patient Position: Sitting)   Pulse 89   Temp 97.8 F (36.6 C) (Tympanic)   Resp 16   Ht 5\' 4"  (1.626 m)   Wt 229 lb 8 oz (104.1 kg)   SpO2 99%   BMI 39.39 kg/m  ECOG: 1  Physical Exam Constitutional:      General: She is not in acute distress.    Appearance: She is not ill-appearing, toxic-appearing or diaphoretic.  HENT:     Head: Normocephalic and atraumatic.  Musculoskeletal:        General: Swelling and tenderness present.     Comments: Swelling, bruising, and tenderness of the left hip.  Skin:    Findings: Bruising present.  Neurological:     Mental Status: She is alert.     Gait: Gait abnormal (The patient is ambulating with the use of a wheelchair.).     Lab Review:     Component Value Date/Time   NA 141 07/13/2020 1544   NA 141 05/16/2017 1416   K 4.5 07/13/2020 1544   K 3.8 05/16/2017 1416   CL 107 07/13/2020 1544   CO2 27 07/13/2020 1544   CO2 27 05/16/2017 1416   GLUCOSE 113 (H) 07/13/2020 1544   GLUCOSE 140 05/16/2017 1416   BUN 17 07/13/2020 1544   BUN 13.2 05/16/2017 1416   CREATININE 0.98 07/13/2020 1544   CREATININE 0.80 09/22/2017 1555   CREATININE 0.8 05/16/2017 1416   CALCIUM 11.0 (H) 07/13/2020 1544   CALCIUM 11.0 (H) 05/16/2017 1416   PROT 6.9 07/13/2020 1544   PROT  6.9 05/16/2017 1416   ALBUMIN 3.8 07/13/2020 1544   ALBUMIN 3.6 05/16/2017 1416   AST 13 (L) 07/13/2020 1544   AST 13 05/16/2017 1416   ALT 13 07/13/2020 1544   ALT <6 05/16/2017 1416   ALKPHOS 118 07/13/2020 1544   ALKPHOS 130 05/16/2017 1416  BILITOT 0.5 07/13/2020 1544   BILITOT 0.31 05/16/2017 1416   GFRNONAA 58 (L) 07/13/2020 1544   GFRNONAA 75 08/19/2015 1602   GFRAA >60 08/07/2019 1305   GFRAA >60 03/21/2018 1417   GFRAA 87 08/19/2015 1602       Component Value Date/Time   WBC 9.3 07/13/2020 1544   WBC 7.7 07/09/2020 1055   RBC 3.92 07/13/2020 1544   HGB 11.3 (L) 07/13/2020 1544   HGB 11.6 05/16/2017 1417   HCT 35.8 (L) 07/13/2020 1544   HCT 36.7 05/16/2017 1417   PLT 253 07/13/2020 1544   PLT 323 05/16/2017 1417   MCV 91.3 07/13/2020 1544   MCV 80.8 05/16/2017 1417   MCH 28.8 07/13/2020 1544   MCHC 31.6 07/13/2020 1544   RDW 14.9 07/13/2020 1544   RDW 16.8 (H) 05/16/2017 1417   LYMPHSABS 1.1 07/13/2020 1544   LYMPHSABS 1.1 05/16/2017 1417   MONOABS 1.2 (H) 07/13/2020 1544   MONOABS 0.8 05/16/2017 1417   EOSABS 0.3 07/13/2020 1544   EOSABS 0.2 05/16/2017 1417   BASOSABS 0.1 07/13/2020 1544   BASOSABS 0.1 05/16/2017 1417   -------------------------------  Imaging from last 24 hours (if applicable):  Radiology interpretation: No results found.

## 2020-07-13 NOTE — Telephone Encounter (Signed)
Per Dr. Jana Hakim  to be seen in symptom management today if possible and to have lab work done. Sandi Mealy PA  to see pt today/ appointment made.

## 2020-07-14 LAB — CANCER ANTIGEN 27.29: CA 27.29: 40.4 U/mL — ABNORMAL HIGH (ref 0.0–38.6)

## 2020-07-21 ENCOUNTER — Encounter: Payer: Self-pay | Admitting: Physician Assistant

## 2020-07-21 ENCOUNTER — Other Ambulatory Visit: Payer: Self-pay

## 2020-07-21 ENCOUNTER — Telehealth: Payer: Self-pay | Admitting: *Deleted

## 2020-07-21 ENCOUNTER — Ambulatory Visit (INDEPENDENT_AMBULATORY_CARE_PROVIDER_SITE_OTHER): Payer: Medicare Other | Admitting: Physician Assistant

## 2020-07-21 VITALS — BP 149/85 | HR 99 | Temp 98.7°F | Ht 64.0 in

## 2020-07-21 DIAGNOSIS — L03119 Cellulitis of unspecified part of limb: Secondary | ICD-10-CM | POA: Diagnosis not present

## 2020-07-21 DIAGNOSIS — T148XXA Other injury of unspecified body region, initial encounter: Secondary | ICD-10-CM | POA: Diagnosis not present

## 2020-07-21 DIAGNOSIS — M25552 Pain in left hip: Secondary | ICD-10-CM

## 2020-07-21 MED ORDER — DOXYCYCLINE HYCLATE 100 MG PO TABS: 100.0000 mg | ORAL_TABLET | Freq: Two times a day (BID) | ORAL | 0 refills | Status: DC

## 2020-07-21 NOTE — Progress Notes (Signed)
Alicia Vasquez is a 80 y.o. female here for a new problem.  I acted as a Education administrator for [employer], PA-C Anselmo Pickler, LPN   History of Present Illness:   Chief Complaint  Patient presents with  . Cellulitis    HPI   Lower leg swelling and redness Patient is with her friend who helps provide her history. Patient is very [place]. Patient and friend state that she received an injection on this past Thursday. She gets Faslodex monthly and last had on 07/09/20 -- I'm not sure if she is confused with this date, but she states that it was 07/16/20 based on her report.  She work up this morning with sudden onset redness and swelling in bilateral lower legs. She has hx of DVT and takes coumadin regularly. Redness in lower legs is worsening with time. Denies fever, chills, nausea, vomiting, malaise.   Left hip pain with large hematoma This has been evaluated by Sandi Mealy, PA-C as recently as 07/13/20. She had drop of her hemoglobin from 13.6 to 11.3 that oncology suspect is consistent with a hematoma. She has ongoing bruising and swelling in this area. Denies: new numbness/tingling, worsening leg/hip pain.   Past Medical History:  Diagnosis Date  . Allergy   . Anxiety   . Arthritis   . Blood transfusion without reported diagnosis   . Breast cancer ([place]) 07/12/2013   Invasive Mammary Carcinoma  . DVT (deep vein thrombosis) in pregnancy   . Hypertension   . Hypothyroid   . Metastatic cancer to bone ([place]) dx'd 12/2016   hip  . Personal history of radiation therapy   . Pneumonia   . Radiation 11/21/13-01/07/14   Right Breast/Supraclavicular     Social History   Tobacco Use  . Smoking status: Never Smoker  . Smokeless tobacco: Never Used  Substance Use Topics  . Alcohol use: No    Alcohol/week: 0.0 standard drinks  . Drug use: No    Past Surgical History:  Procedure Laterality Date  . BREAST LUMPECTOMY Left 2015  . BREAST LUMPECTOMY WITH RADIOACTIVE SEED LOCALIZATION Right  09/09/2013   Procedure: BREAST LUMPECTOMY WITH RADIOACTIVE SEED LOCALIZATION WITH AXILLARY NODE EXCISION;  Surgeon: [name], MD;  Location: [facility];  Service: General;  Laterality: Right;  . DENTAL SURGERY  04/19/2012   13 TEETH REMOVED  . DILATION AND CURETTAGE OF UTERUS    . ORIF PERIPROSTHETIC FRACTURE Left 01/31/2017   Procedure: REVISION and OPEN REDUCTION INTERNAL FIXATION (ORIF) PERIPROSTHETIC FRACTURE LEFT HIP;  Surgeon: Paralee Cancel, MD;  [facility];  Service: Orthopedics;  Laterality: Left;  120 mins  . RE-EXCISION OF BREAST LUMPECTOMY Right 09/24/2013   Procedure: RE-EXCISION OF RIGHT BREAST LUMPECTOMY;  Surgeon: [name], MD;  Location: [facility];  Service: General;  Laterality: Right;  . TOTAL HIP ARTHROPLASTY Left 01/16/2017   Procedure: TOTAL HIP ARTHROPLASTY POSTERIOR;  Surgeon: Paralee Cancel, MD;  [facility];  Service: Orthopedics;  Laterality: Left;    Family History  Problem Relation Age of Onset  . Heart disease Brother   . Colon cancer Brother   . Prostate cancer Brother     Allergies  Allergen Reactions  . Anesthetics, Amide Hypertension  . Benadryl [Diphenhydramine Hcl] Other (See Comments)    Dizziness  . Carbocaine [Mepivacaine Hcl] Hypertension  . Codeine Other (See Comments)    Dizziness  . Epinephrine Hypertension  . Sulfa Antibiotics Other (See Comments)    dizziness  . Latex  Other (See Comments)    Blisters in mouth  . Tramadol     Sedation.   Marland Kitchen Penicillins Rash    Has patient had a PCN reaction causing immediate rash, facial/tongue/throat swelling, SOB or lightheadedness with hypotension: Unknown Has patient had a PCN reaction causing severe rash involving mucus membranes or skin necrosis: Unknown Has patient had a PCN reaction that required hospitalization: Unknown Has patient had a PCN reaction occurring within the last 10 years: Unknown If all of the above answers are "NO", then may  proceed with Cephalosporin use.     Current Medications:   Current Outpatient Medications:  .  B-D TB SYRINGE 1CC/27GX1/2" 27G X 1/2" 1 ML MISC, USE TO DRAW UP 1 ML LIDOCAINE AND INJECT INTO CEFTRIAXONE VIAL FOR DILUTION. CHANGE TO 25G NEEDLE FOR ADMINISTRATION, Disp: , Rfl:  .  Cholecalciferol (VITAMIN D3) 5000 UNITS TABS, Take 5,000 Units by mouth daily. , Disp: , Rfl:  .  doxycycline (VIBRA-TABS) 100 MG tablet, Take 1 tablet (100 mg total) by mouth 2 (two) times daily., Disp: 20 tablet, Rfl: 0 .  HYDROcodone-acetaminophen (NORCO/VICODIN) 5-325 MG tablet, Take 1 tablet by mouth every 6 (six) hours as needed for moderate pain., Disp: 120 tablet, Rfl: 0 .  ketoconazole (NIZORAL) 2 % cream, Apply 1 application topically daily., Disp: 15 g, Rfl: 0 .  NEEDLE, DISP, 25 G 25G X 1-1/2" MISC, Please use to administer rocephin injection, Disp: 15 each, Rfl: 0 .  SYNTHROID 125 MCG tablet, TAKE 1 TABLET BY MOUTH EVERY DAY BEFORE BREAKFAST, Disp: 90 tablet, Rfl: 4 .  Syringe, Disposable, 5 ML MISC, Please use to inject rocephin injection, Disp: 5 each, Rfl: 0 .  vitamin C (ASCORBIC ACID) 250 MG tablet, Take 250 mg by mouth 3 (three) times daily., Disp: , Rfl:  .  warfarin (COUMADIN) 5 MG tablet, Take 7.5 mg daily., Disp: 270 tablet, Rfl: 1 .  lidocaine (XYLOCAINE) 1 % (with preservative) injection, SMARTSIG:Rectally (Patient not taking: Reported on 07/21/2020), Disp: , Rfl:    Review of Systems:   ROS Negative unless otherwise specified per HPI.  Vitals:   Vitals:   07/21/20 1437  BP: (!) 149/85  Pulse: 99  Temp: 98.7 F (37.1 C)  SpO2: 94%  Height: 5\' 4"  (1.626 m)     Body mass index is 39.39 kg/m.  Physical Exam:   Physical Exam Vitals and nursing note reviewed.  Constitutional:      General: She is not in acute distress.    Appearance: She is well-developed. She is not ill-appearing, toxic-appearing or sickly-appearing.  Cardiovascular:     Rate and Rhythm: Normal rate and  regular rhythm.     Pulses: Normal pulses.          Dorsalis pedis pulses are 2+ on the right side and 2+ on the left side.       Posterior tibial pulses are 2+ on the right side and 2+ on the left side.     Heart sounds: Normal heart sounds, S1 normal and S2 normal.  Pulmonary:     Effort: Pulmonary effort is normal.     Breath sounds: Normal breath sounds.  Musculoskeletal:     Right lower leg: 1+ Edema present.     Left lower leg: 1+ Edema present.  Skin:    General: Skin is warm, dry and intact.     Comments: Bilateral lower leg redness, swelling and warmth. Very mild TTP. Area was marked with skin marker today.  R buttock with significant swelling and surround ecchymosis, warmth and TTP  Neurological:     Mental Status: She is alert.     GCS: GCS eye subscore is 4. GCS verbal subscore is 5. GCS motor subscore is 6.     Comments: Normal sensation to bilateral lower legs per patient report  Psychiatric:        Mood and Affect: Mood and affect normal.        Speech: Speech normal.        Behavior: Behavior normal. Behavior is cooperative.          Assessment and Plan:   Brynnlee was seen today for cellulitis.  Diagnoses and all orders for this visit:  Left hip pain; Hematoma Provided reassurance to patient. Reviewed with PCP and reviewed oncology's note. Recommend continued current treatment and monitoring and suspect this will continue to improve with time. No red flags on my exam.  Cellulitis of lower extremity, unspecified laterality Concern for cellulitis on exam. No systemic signs/symptoms on my exam and she appears non-toxic. Will trial oral doxycycline. Area marked with skin marker. I have asked her to notify her provider who manages her INR about the start of oral doxycycline.  STRICT worsening precautions advised and put on patients AVS: "Please call us or go the ER if you develop: Redness spreading outside of the marked lines Fever, chills, vomiting, poor  appetite Any other new symptoms."  She is scheduled for recheck of cellulitis on Friday with [name].  I will forward this note to oncology providers so they are aware.  Other orders -     doxycycline (VIBRA-TABS) 100 MG tablet; Take 1 tablet (100 mg total) by mouth 2 (two) times daily.  CMA or LPN served as scribe during this visit. History, Physical, and Plan performed by medical provider. The above documentation has been reviewed and is accurate and complete.  Time spent with patient today was 25 minutes which consisted of chart review, discussing diagnosis, work up, treatment answering questions and documentation.   [name], PA-C

## 2020-07-21 NOTE — Telephone Encounter (Signed)
Patient left VM with concerns of "reaction" to L left from injection.  RN spoke with Sandi Mealy in regards to patient's concerns.  RN returned patient's call x2 with no answer, left VM x1 requesting patient to return call to schedule labs and office visit with Sandi Mealy.  RN will continue to attempt to reach patient.

## 2020-07-21 NOTE — Patient Instructions (Signed)
It was great to see you!  Start oral doxycycline antibiotic 100 mg twice daily x 10 days.  Please call the office that manages your INR and tell them that you are starting an antibiotic and want them to see if you need to adjust your coumadin OR come in for sooner INR check.  I will message your oncology nurse practitioner so she is aware of what is going on.  Please call us or go the ER if you develop: Redness spreading outside of the marked lines Fever, chills, vomiting, poor appetite Any other new symptoms  Let's follow-up in on Friday for recheck, sooner if you have concerns.  Take care,  [name] PA-C

## 2020-07-22 ENCOUNTER — Telehealth: Payer: Self-pay

## 2020-07-22 NOTE — Telephone Encounter (Signed)
Called and left a message asking her to call the office back to schedule appt today with Sandi Mealy, PA. Attempted to call [name], no answer and I did not leave a message.

## 2020-07-24 ENCOUNTER — Other Ambulatory Visit (HOSPITAL_COMMUNITY): Payer: Medicare Other

## 2020-07-24 ENCOUNTER — Ambulatory Visit (HOSPITAL_COMMUNITY): Payer: Medicare Other

## 2020-07-24 ENCOUNTER — Encounter: Payer: Self-pay | Admitting: Physician Assistant

## 2020-07-24 ENCOUNTER — Ambulatory Visit (HOSPITAL_COMMUNITY)
Admission: RE | Admit: 2020-07-24 | Discharge: 2020-07-24 | Disposition: A | Discharge status: Home / Self Care | Payer: Medicare Other | Source: Ambulatory Visit | Attending: Physician Assistant | Admitting: Physician Assistant

## 2020-07-24 ENCOUNTER — Ambulatory Visit (INDEPENDENT_AMBULATORY_CARE_PROVIDER_SITE_OTHER): Payer: Medicare Other | Admitting: Physician Assistant

## 2020-07-24 ENCOUNTER — Other Ambulatory Visit: Payer: Self-pay

## 2020-07-24 VITALS — BP 130/60 | HR 82 | Temp 98.1°F

## 2020-07-24 DIAGNOSIS — L03119 Cellulitis of unspecified part of limb: Secondary | ICD-10-CM | POA: Diagnosis not present

## 2020-07-24 NOTE — Progress Notes (Signed)
Alicia Vasquez is a 80 y.o. female is here for follow up.  I acted as a Education administrator for [employer], PA-C Anselmo Pickler, LPN   History of Present Illness:   Chief Complaint  Patient presents with  . Cellulitis    HPI   Cellulitis Pt here for f/u from her visit with me on 07/22/20. She was found to have BLE cellulitis and was started on 100 mg doxycycline BID. She has been taking this as prescribed. She denies: fevers, chills, malaise, n/v, poor appetite.  She feels as though her legs are more swollen and tender. Denies open lesions.   Health Maintenance Due  Topic Date Due  . COVID-19 Vaccine (1) Never done    Past Medical History:  Diagnosis Date  . Allergy   . Anxiety   . Arthritis   . Blood transfusion without reported diagnosis   . Breast cancer ([place]) 07/12/2013   Invasive Mammary Carcinoma  . DVT (deep vein thrombosis) in pregnancy   . Hypertension   . Hypothyroid   . Metastatic cancer to bone ([place]) dx'd 12/2016   hip  . Personal history of radiation therapy   . Pneumonia   . Radiation 11/21/13-01/07/14   Right Breast/Supraclavicular     Social History   Tobacco Use  . Smoking status: Never Smoker  . Smokeless tobacco: Never Used  Substance Use Topics  . Alcohol use: No    Alcohol/week: 0.0 standard drinks  . Drug use: No    Past Surgical History:  Procedure Laterality Date  . BREAST LUMPECTOMY Left 2015  . BREAST LUMPECTOMY WITH RADIOACTIVE SEED LOCALIZATION Right 09/09/2013   Procedure: BREAST LUMPECTOMY WITH RADIOACTIVE SEED LOCALIZATION WITH AXILLARY NODE EXCISION;  Surgeon: [name], MD;  Location: [place];  Service: General;  Laterality: Right;  . DENTAL SURGERY  04/19/2012   13 TEETH REMOVED  . DILATION AND CURETTAGE OF UTERUS    . ORIF PERIPROSTHETIC FRACTURE Left 01/31/2017   Procedure: REVISION and OPEN REDUCTION INTERNAL FIXATION (ORIF) PERIPROSTHETIC FRACTURE LEFT HIP;  Surgeon: Paralee Cancel, MD;   Location: [facility];  Service: Orthopedics;  Laterality: Left;  120 mins  . RE-EXCISION OF BREAST LUMPECTOMY Right 09/24/2013   Procedure: RE-EXCISION OF RIGHT BREAST LUMPECTOMY;  Surgeon: [name], MD;  Location: [place];  Service: General;  Laterality: Right;  . TOTAL HIP ARTHROPLASTY Left 01/16/2017   Procedure: TOTAL HIP ARTHROPLASTY POSTERIOR;  Surgeon: Paralee Cancel, MD;  Location[facility];  Service: Orthopedics;  Laterality: Left;    Family History  Problem Relation Age of Onset  . Heart disease Brother   . Colon cancer Brother   . Prostate cancer Brother     PMHx, SurgHx, SocialHx, FamHx, Medications, and Allergies were reviewed in the Visit Navigator and updated as appropriate.   Patient Active Problem List   Diagnosis Date Noted  . Aortic atherosclerosis ([place]) 06/13/2017  . Pain from bone metastases ([place]) 01/25/2017  . Bone metastases ([place]) 01/25/2017  . Endometrial hyperplasia 01/16/2017  . Lytic bone lesion of left femur 01/15/2017  . Hip fracture, pathological ([place]) 01/15/2017  . Lymphedema 09/17/2015  . Long term current use of anticoagulant therapy 08/23/2015  . DVT, lower extremity ([place]) 06/18/2015  . Bilateral knee pain 04/03/2015  . Arm edema 08/28/2014  . Vitamin D deficiency 04/23/2014  . Hyperparathyroidism ([place]) 04/23/2014  . Depression 07/18/2013  . Malignant neoplasm of upper-outer quadrant of right breast in female, estrogen receptor positive ([place]) 07/15/2013  .  Overactive bladder 01/30/2013  . Primary hypercoagulable state ([place]) 12/19/2012  . HTN (hypertension) 09/27/2011  . Hearing loss 09/27/2011  . Seasonal allergies 09/27/2011  . Hypothyroid 08/26/2011    Social History   Tobacco Use  . Smoking status: Never Smoker  . Smokeless tobacco: Never Used  Substance Use Topics  . Alcohol use: No    Alcohol/week: 0.0 standard drinks  . Drug use: No    Current Medications and Allergies:    Current Outpatient Medications:   .  B-D TB SYRINGE 1CC/27GX1/2" 27G X 1/2" 1 ML MISC, USE TO DRAW UP 1 ML LIDOCAINE AND INJECT INTO CEFTRIAXONE VIAL FOR DILUTION. CHANGE TO 25G NEEDLE FOR ADMINISTRATION, Disp: , Rfl:  .  Cholecalciferol (VITAMIN D3) 5000 UNITS TABS, Take 5,000 Units by mouth daily. , Disp: , Rfl:  .  doxycycline (VIBRA-TABS) 100 MG tablet, Take 1 tablet (100 mg total) by mouth 2 (two) times daily., Disp: 20 tablet, Rfl: 0 .  HYDROcodone-acetaminophen (NORCO/VICODIN) 5-325 MG tablet, Take 1 tablet by mouth every 6 (six) hours as needed for moderate pain., Disp: 120 tablet, Rfl: 0 .  ketoconazole (NIZORAL) 2 % cream, Apply 1 application topically daily., Disp: 15 g, Rfl: 0 .  lidocaine (XYLOCAINE) 1 % (with preservative) injection, , Disp: , Rfl:  .  NEEDLE, DISP, 25 G 25G X 1-1/2" MISC, Please use to administer rocephin injection, Disp: 15 each, Rfl: 0 .  SYNTHROID 125 MCG tablet, TAKE 1 TABLET BY MOUTH EVERY DAY BEFORE BREAKFAST, Disp: 90 tablet, Rfl: 4 .  Syringe, Disposable, 5 ML MISC, Please use to inject rocephin injection, Disp: 5 each, Rfl: 0 .  vitamin C (ASCORBIC ACID) 250 MG tablet, Take 250 mg by mouth 3 (three) times daily., Disp: , Rfl:  .  warfarin (COUMADIN) 5 MG tablet, Take 7.5 mg daily., Disp: 270 tablet, Rfl: 1   Allergies  Allergen Reactions  . Anesthetics, Amide Hypertension  . Benadryl [Diphenhydramine Hcl] Other (See Comments)    Dizziness  . Carbocaine [Mepivacaine Hcl] Hypertension  . Codeine Other (See Comments)    Dizziness  . Epinephrine Hypertension  . Sulfa Antibiotics Other (See Comments)    dizziness  . Latex Other (See Comments)    Blisters in mouth  . Tramadol     Sedation.   Marland Kitchen Penicillins Rash    Has patient had a PCN reaction causing immediate rash, facial/tongue/throat swelling, SOB or lightheadedness with hypotension: Unknown Has patient had a PCN reaction causing severe rash involving mucus membranes or skin necrosis: Unknown Has patient had a PCN reaction that  required hospitalization: Unknown Has patient had a PCN reaction occurring within the last 10 years: Unknown If all of the above answers are "NO", then may proceed with Cephalosporin use.     Review of Systems   ROS  Negative unless otherwise specified per HPI.  Vitals:   Vitals:   07/24/20 1515  BP: 130/60  Pulse: 82  Temp: 98.1 F (36.7 C)  TempSrc: Temporal  SpO2: 97%     There is no height or weight on file to calculate BMI.   Physical Exam:    Physical Exam Vitals and nursing note reviewed.  Constitutional:      General: She is not in acute distress.    Appearance: She is well-developed. She is not ill-appearing, toxic-appearing or sickly-appearing.  Cardiovascular:     Rate and Rhythm: Normal rate and regular rhythm.     Pulses: Normal pulses.  Dorsalis pedis pulses are 2+ on the right side and 2+ on the left side.       Posterior tibial pulses are 2+ on the right side and 2+ on the left side.     Heart sounds: Normal heart sounds, S1 normal and S2 normal.  Pulmonary:     Effort: Pulmonary effort is normal.     Breath sounds: Normal breath sounds.  Musculoskeletal:     Comments: Bilateral LE with skin marker from prior visit Area of erythema does not extend beyond skin marker Area is erythematous however compared to pictures from last visit, it has dramatically improved in intensity  RLE with 1+ edema LLE with trace edema  TTP to LLE >> RLE   Skin:    General: Skin is warm, dry and intact.  Neurological:     Mental Status: She is alert.     GCS: GCS eye subscore is 4. GCS verbal subscore is 5. GCS motor subscore is 6.     Comments: Decreased perceived sensation to right foot  Psychiatric:        Mood and Affect: Mood and affect normal.        Speech: Speech normal.        Behavior: Behavior normal. Behavior is cooperative.       Assessment and Plan:    Leilanni was seen today for cellulitis.  Diagnoses and all orders for this  visit:  Cellulitis of lower extremity, unspecified laterality -     VAS [name] LOWER EXTREMITY VENOUS (DVT); Future   She is taking doxycycline as prescribed. She has significant tenderness to LLE and increased swelling to RLE since our last visit two days ago. Stat venous u/s performed. Preliminary result is negative for DVT. Continue oral antibiotics, keep legs elevated and continue to monitor sx. Recommend follow-up with our office next week if symptoms do not improve. If any worsening, needs to go to the ER.  CMA or LPN served as scribe during this visit. History, Physical, and Plan performed by medical provider. The above documentation has been reviewed and is accurate and complete.   [name], PA-C [place] [place] 07/24/2020  Follow-up: No follow-ups on file.

## 2020-07-24 NOTE — Patient Instructions (Signed)
It was great to see you!  Please go now for ultrasound.   I will be in touch with results.  Continue antibiotic as prescribed.  Take care,  [name] PA-C

## 2020-07-27 ENCOUNTER — Inpatient Hospital Stay (HOSPITAL_BASED_OUTPATIENT_CLINIC_OR_DEPARTMENT_OTHER): Payer: Medicare Other | Admitting: Medical

## 2020-07-27 ENCOUNTER — Telehealth: Payer: Self-pay

## 2020-07-27 ENCOUNTER — Inpatient Hospital Stay: Payer: Medicare Other

## 2020-07-27 ENCOUNTER — Other Ambulatory Visit: Payer: Self-pay

## 2020-07-27 VITALS — BP 161/66 | HR 79 | Temp 97.7°F | Resp 15 | Ht 64.0 in | Wt 232.5 lb

## 2020-07-27 DIAGNOSIS — B372 Candidiasis of skin and nail: Secondary | ICD-10-CM

## 2020-07-27 DIAGNOSIS — C7951 Secondary malignant neoplasm of bone: Secondary | ICD-10-CM

## 2020-07-27 DIAGNOSIS — D6859 Other primary thrombophilia: Secondary | ICD-10-CM | POA: Diagnosis not present

## 2020-07-27 DIAGNOSIS — E039 Hypothyroidism, unspecified: Secondary | ICD-10-CM | POA: Diagnosis not present

## 2020-07-27 DIAGNOSIS — C50411 Malignant neoplasm of upper-outer quadrant of right female breast: Secondary | ICD-10-CM

## 2020-07-27 DIAGNOSIS — Z5111 Encounter for antineoplastic chemotherapy: Secondary | ICD-10-CM | POA: Diagnosis not present

## 2020-07-27 DIAGNOSIS — Z170 Estrogen receptor positive status [ER+]: Secondary | ICD-10-CM

## 2020-07-27 DIAGNOSIS — I10 Essential (primary) hypertension: Secondary | ICD-10-CM | POA: Diagnosis not present

## 2020-07-27 DIAGNOSIS — I82401 Acute embolism and thrombosis of unspecified deep veins of right lower extremity: Secondary | ICD-10-CM

## 2020-07-27 LAB — CBC WITH DIFFERENTIAL (CANCER CENTER ONLY)
Abs Immature Granulocytes: 0.1 10*3/uL — ABNORMAL HIGH (ref 0.00–0.07)
Basophils Absolute: 0.1 10*3/uL (ref 0.0–0.1)
Basophils Relative: 1 %
Eosinophils Absolute: 0.7 10*3/uL — ABNORMAL HIGH (ref 0.0–0.5)
Eosinophils Relative: 8 %
HCT: 36.3 % (ref 36.0–46.0)
Hemoglobin: 11.2 g/dL — ABNORMAL LOW (ref 12.0–15.0)
Immature Granulocytes: 1 %
Lymphocytes Relative: 16 %
Lymphs Abs: 1.3 10*3/uL (ref 0.7–4.0)
MCH: 28.5 pg (ref 26.0–34.0)
MCHC: 30.9 g/dL (ref 30.0–36.0)
MCV: 92.4 fL (ref 80.0–100.0)
Monocytes Absolute: 0.7 10*3/uL (ref 0.1–1.0)
Monocytes Relative: 9 %
Neutro Abs: 5.5 10*3/uL (ref 1.7–7.7)
Neutrophils Relative %: 65 %
Platelet Count: 283 10*3/uL (ref 150–400)
RBC: 3.93 MIL/uL (ref 3.87–5.11)
RDW: 14.9 % (ref 11.5–15.5)
WBC Count: 8.3 10*3/uL (ref 4.0–10.5)
nRBC: 0 % (ref 0.0–0.2)

## 2020-07-27 LAB — CMP (CANCER CENTER ONLY)
ALT: 14 U/L (ref 0–44)
AST: 13 U/L — ABNORMAL LOW (ref 15–41)
Albumin: 3.6 g/dL (ref 3.5–5.0)
Alkaline Phosphatase: 113 U/L (ref 38–126)
Anion gap: 9 (ref 5–15)
BUN: 15 mg/dL (ref 8–23)
CO2: 27 mmol/L (ref 22–32)
Calcium: 10.7 mg/dL — ABNORMAL HIGH (ref 8.9–10.3)
Chloride: 106 mmol/L (ref 98–111)
Creatinine: 0.86 mg/dL (ref 0.44–1.00)
GFR, Estimated: >60 mL/min (ref >60)
Glucose, Bld: 164 mg/dL — ABNORMAL HIGH (ref 70–99)
Potassium: 4.6 mmol/L (ref 3.5–5.1)
Sodium: 142 mmol/L (ref 135–145)
Total Bilirubin: 0.3 mg/dL (ref 0.3–1.2)
Total Protein: 6.7 g/dL (ref 6.5–8.1)

## 2020-07-27 LAB — PROTIME-INR
INR: 1.9 — ABNORMAL HIGH (ref 0.8–1.2)
Prothrombin Time: 20.7 seconds — ABNORMAL HIGH (ref 11.4–15.2)

## 2020-07-27 MED ORDER — CLOTRIMAZOLE-BETAMETHASONE 1-0.05 % EX LOTN: TOPICAL_LOTION | Freq: Two times a day (BID) | CUTANEOUS | 0 refills | Status: DC

## 2020-07-27 NOTE — Telephone Encounter (Signed)
Alicia Vasquez already aware of results.

## 2020-07-27 NOTE — Telephone Encounter (Signed)
Caller, Matt with cardiovascular imaging on [address] with preliminary results venous duplex

## 2020-07-27 NOTE — Progress Notes (Signed)
[facility] Progress Note   Alicia Vasquez Jul 23, 1940 80 y.o.  Alicia Vasquez is managed by Dr. Jana Vasquez  Actively treated with chemotherapy/immunotherapy/hormonal therapy: yes  Current therapy: Faslodex  Last treated: 07/09/2020  Next scheduled appointment with provider: 10/01/2020  Assessment: Plan:    Malignant neoplasm of upper-outer quadrant of right breast in female, estrogen receptor positive ([place])  Bone metastases ([place])  Primary hypercoagulable state ([place])   Metastatic ER positive malignant neoplasm of the right breast with bone metastasis: [name] continues to be managed by Dr. Jana Vasquez and receives monthly Faslodex with her last injection given on 07/09/2020.  She continues to receive monthly injections and will be seen in follow-up on 10/01/2020.  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   PT/INR was repeated today.  Left hip pain with a large hematoma: She continues to swelling and pain in her left hip.  A CBC returned with a hemoglobin that was low but stable at 11.2.  The patient will return as scheduled.  Please see After Visit Summary for patient specific instructions.  Future Appointments  Date Time Provider [place]  07/27/2020  2:00 PM [facility] None  07/27/2020  2:30 PM Silveria Vasquez, Alicia Code., PA-C [facility] None  08/06/2020 11:15 AM [facility] None  08/06/2020 11:45 AM [facility] [place] [facility] None  08/28/2020  3:20 PM Philemon Kingdom, MD [facility] None  09/03/2020 11:00 AM [facility] None  09/03/2020 11:45 AM [facility] [place] [facility] None  10/01/2020 11:30 AM [facility] None  10/01/2020 12:00 PM Magrinat, [name], MD [facility] None  10/01/2020 12:30 PM [facility] [place] [facility] None  10/16/2020 11:00 AM Marzetta Board, DPM [facility]     No orders of the defined types were placed in this encounter.      Subjective:   Patient ID:  Alicia Vasquez is a 80 y.o. (DOB 01/07/1941) female.  Chief Complaint: No chief complaint on file.   HPI Alicia Vasquez  is a 80 y.o. female with a diagnosis of a metastatic ER positive malignant neoplasm of the right breast with bone metastasis.  She is followed by Dr. Jana Vasquez and receives monthly Faslodex with her last injection given on 07/09/2020.  She was last seen on 07/13/2020. She has a history of a DVT and is on long-term anticoagulation with Coumadin.  She continues taking 7.5 mg daily.  Her INR on 07/09/2020 returned at 3.7.  When she was last seen, she had pain after her last Faslodex injection and noted scant blood from the injection site.  She was having pain, swelling, and bruising of her left hip. Multiple attempts have been made to have the patient return for a follow up visit with repeat labs.  She continues to have swelling in her left hip and tenderness there.  She has multiple questions today and multiple issues or concerns besides her left hip pain.    Medications: I have reviewed the patient's current medications.  Allergies:  Allergies  Allergen Reactions  . Anesthetics, Amide Hypertension  . Benadryl [Diphenhydramine Hcl] Other (See Comments)    Dizziness  . Carbocaine [Mepivacaine Hcl] Hypertension  . Codeine Other (See Comments)    Dizziness  . Epinephrine Hypertension  . Sulfa Antibiotics Other (See Comments)    dizziness  . Latex Other (See Comments)    Blisters in mouth  .  Tramadol     Sedation.   Marland Kitchen Penicillins Rash    Has patient had a PCN reaction causing immediate rash, facial/tongue/throat swelling, SOB or lightheadedness with hypotension: Unknown Has patient had a PCN reaction causing severe rash involving mucus membranes or skin necrosis: Unknown Has patient had a PCN reaction that required hospitalization: Unknown Has patient had a PCN  reaction occurring within the last 10 years: Unknown If all of the above answers are "NO", then may proceed with Cephalosporin use.     Past Medical History:  Diagnosis Date  . Allergy   . Anxiety   . Arthritis   . Blood transfusion without reported diagnosis   . Breast cancer ([place]) 07/12/2013   Invasive Mammary Carcinoma  . DVT (deep vein thrombosis) in pregnancy   . Hypertension   . Hypothyroid   . Metastatic cancer to bone ([place]) dx'd 12/2016   hip  . Personal history of radiation therapy   . Pneumonia   . Radiation 11/21/13-01/07/14   Right Breast/Supraclavicular    Past Surgical History:  Procedure Laterality Date  . BREAST LUMPECTOMY Left 2015  . BREAST LUMPECTOMY WITH RADIOACTIVE SEED LOCALIZATION Right 09/09/2013   Procedure: BREAST LUMPECTOMY WITH RADIOACTIVE SEED LOCALIZATION WITH AXILLARY NODE EXCISION;  Surgeon: [name], MD;  Location: [place];  Service: General;  Laterality: Right;  . DENTAL SURGERY  04/19/2012   13 TEETH REMOVED  . DILATION AND CURETTAGE OF UTERUS    . ORIF PERIPROSTHETIC FRACTURE Left 01/31/2017   Procedure: REVISION and OPEN REDUCTION INTERNAL FIXATION (ORIF) PERIPROSTHETIC FRACTURE LEFT HIP;  Surgeon: Paralee Cancel, MD;  [facility];  Service: Orthopedics;  Laterality: Left;  120 mins  . RE-EXCISION OF BREAST LUMPECTOMY Right 09/24/2013   Procedure: RE-EXCISION OF RIGHT BREAST LUMPECTOMY;  Surgeon: [name], MD;  Location: [place];  Service: General;  Laterality: Right;  . TOTAL HIP ARTHROPLASTY Left 01/16/2017   Procedure: TOTAL HIP ARTHROPLASTY POSTERIOR;  Surgeon: Paralee Cancel, MD;  [facility];  Service: Orthopedics;  Laterality: Left;    Family History  Problem Relation Age of Onset  . Heart disease Brother   . Colon cancer Brother   . Prostate cancer Brother     Social History   Socioeconomic History  . Marital status: Widowed    Spouse name: Not on file  . Number of  children: 1  . Years of education: Not on file  . Highest education level: Not on file  Occupational History  . Not on file  Tobacco Use  . Smoking status: Never Smoker  . Smokeless tobacco: Never Used  Substance and Sexual Activity  . Alcohol use: No    Alcohol/week: 0.0 standard drinks  . Drug use: No  . Sexual activity: Never  Other Topics Concern  . Not on file  Social History Narrative   Exercise: yard work.   Social Determinants of Health   Financial Resource Strain: Not on file  Food Insecurity: Not on file  Transportation Needs: Not on file  Physical Activity: Not on file  Stress: Not on file  Social Connections: Not on file  Intimate Partner Violence: Not on file    Past Medical History, Surgical history, Social history, and Family history were reviewed and updated as appropriate.   Please see review of systems for further details on the patient's review from today.   Review of Systems:  Review of Systems  Constitutional: Negative for chills, diaphoresis and fever.  HENT: Negative for  trouble swallowing and voice change.   Respiratory: Negative for cough, chest tightness, shortness of breath and wheezing.   Cardiovascular: Negative for chest pain and palpitations.  Gastrointestinal: Negative for abdominal pain, constipation, diarrhea, nausea and vomiting.  Musculoskeletal: Positive for arthralgias, gait problem and joint swelling. Negative for back pain and myalgias.       Swelling, tenderness, and bruising of the left hip.  Neurological: Negative for dizziness, light-headedness and headaches.  Hematological: Bruises/bleeds easily.    Objective:   Physical Exam:  There were no vitals taken for this visit. ECOG: 1  Physical Exam Constitutional:      General: She is not in acute distress.    Appearance: She is not ill-appearing, toxic-appearing or diaphoretic.  HENT:     Head: Normocephalic and atraumatic.  Eyes:     General: No scleral icterus.        Right eye: No discharge.        Left eye: No discharge.     Conjunctiva/sclera: Conjunctivae normal.  Musculoskeletal:        General: Swelling and tenderness present.     Comments: Swelling, bruising, and tenderness of the left hip.  Skin:    Findings: Bruising present.  Neurological:     Mental Status: She is alert.     Gait: Gait abnormal (The patient is ambulating with the use of a wheelchair.).     Lab Review:     Component Value Date/Time   NA 141 07/13/2020 1544   NA 141 05/16/2017 1416   K 4.5 07/13/2020 1544   K 3.8 05/16/2017 1416   CL 107 07/13/2020 1544   CO2 27 07/13/2020 1544   CO2 27 05/16/2017 1416   GLUCOSE 113 (H) 07/13/2020 1544   GLUCOSE 140 05/16/2017 1416   BUN 17 07/13/2020 1544   BUN 13.2 05/16/2017 1416   CREATININE 0.98 07/13/2020 1544   CREATININE 0.80 09/22/2017 1555   CREATININE 0.8 05/16/2017 1416   CALCIUM 11.0 (H) 07/13/2020 1544   CALCIUM 11.0 (H) 05/16/2017 1416   PROT 6.9 07/13/2020 1544   PROT 6.9 05/16/2017 1416   ALBUMIN 3.8 07/13/2020 1544   ALBUMIN 3.6 05/16/2017 1416   AST 13 (L) 07/13/2020 1544   AST 13 05/16/2017 1416   ALT 13 07/13/2020 1544   ALT <6 05/16/2017 1416   ALKPHOS 118 07/13/2020 1544   ALKPHOS 130 05/16/2017 1416   BILITOT 0.5 07/13/2020 1544   BILITOT 0.31 05/16/2017 1416   GFRNONAA 58 (L) 07/13/2020 1544   GFRNONAA 75 08/19/2015 1602   GFRAA >60 08/07/2019 1305   GFRAA >60 03/21/2018 1417   GFRAA 87 08/19/2015 1602       Component Value Date/Time   WBC 9.3 07/13/2020 1544   WBC 7.7 07/09/2020 1055   RBC 3.92 07/13/2020 1544   HGB 11.3 (L) 07/13/2020 1544   HGB 11.6 05/16/2017 1417   HCT 35.8 (L) 07/13/2020 1544   HCT 36.7 05/16/2017 1417   PLT 253 07/13/2020 1544   PLT 323 05/16/2017 1417   MCV 91.3 07/13/2020 1544   MCV 80.8 05/16/2017 1417   MCH 28.8 07/13/2020 1544   MCHC 31.6 07/13/2020 1544   RDW 14.9 07/13/2020 1544   RDW 16.8 (H) 05/16/2017 1417   LYMPHSABS 1.1 07/13/2020 1544    LYMPHSABS 1.1 05/16/2017 1417   MONOABS 1.2 (H) 07/13/2020 1544   MONOABS 0.8 05/16/2017 1417   EOSABS 0.3 07/13/2020 1544   EOSABS 0.2 05/16/2017 1417   BASOSABS 0.1 07/13/2020 1544  BASOSABS 0.1 05/16/2017 1417   -------------------------------  Imaging from last 24 hours (if applicable):  Radiology interpretation: VAS [name] LOWER EXTREMITY VENOUS (DVT)  Result Date: 07/24/2020  Lower Venous DVT Study Indications: Swelling, Pain, Edema, and Erythema.  Risk Factors: Patient uses wheel chair. Cancer History of breast Cancer DVT History of left leg DVT per patient. Anticoagulation: Coumadin. Limitations: Cellulitis and lymphedema. Comparison Study: Multiple prior lower venous studies showed no evidence of DVT                   bilaterally. Performing Technologist: Delorise Shiner RVT  Examination Guidelines: A complete evaluation includes B-mode imaging, spectral Doppler, color Doppler, and power Doppler as needed of all accessible portions of each vessel. Bilateral testing is considered an integral part of a complete examination. Limited examinations for reoccurring indications may be performed as noted. The reflux portion of the exam is performed with the patient in reverse Trendelenburg.  +---------+---------------+---------+-----------+----------+--------------+ RIGHT    CompressibilityPhasicitySpontaneityPropertiesThrombus Aging +---------+---------------+---------+-----------+----------+--------------+ CFV      Full           Yes      Yes                                 +---------+---------------+---------+-----------+----------+--------------+ SFJ      Full           Yes      Yes                                 +---------+---------------+---------+-----------+----------+--------------+ FV Prox  Full           Yes      Yes                                 +---------+---------------+---------+-----------+----------+--------------+ FV Mid   Full           Yes      Yes                                  +---------+---------------+---------+-----------+----------+--------------+ FV DistalFull           Yes      Yes                                 +---------+---------------+---------+-----------+----------+--------------+ PFV      Full                    Yes                                 +---------+---------------+---------+-----------+----------+--------------+ POP      Full           Yes      Yes                                 +---------+---------------+---------+-----------+----------+--------------+ PTV      Full                                                        +---------+---------------+---------+-----------+----------+--------------+  PERO     Full                                                        +---------+---------------+---------+-----------+----------+--------------+ GSV      Full                    Yes                                 +---------+---------------+---------+-----------+----------+--------------+ SSV      Full                                                        +---------+---------------+---------+-----------+----------+--------------+   +---------+---------------+---------+-----------+----------+--------------+ LEFT     CompressibilityPhasicitySpontaneityPropertiesThrombus Aging +---------+---------------+---------+-----------+----------+--------------+ CFV      Full           Yes      Yes                                 +---------+---------------+---------+-----------+----------+--------------+ SFJ      Full           Yes      Yes                                 +---------+---------------+---------+-----------+----------+--------------+ FV Prox  Full           Yes      Yes                                 +---------+---------------+---------+-----------+----------+--------------+ FV Mid   Full           Yes      Yes                                  +---------+---------------+---------+-----------+----------+--------------+ FV DistalFull           Yes      Yes                                 +---------+---------------+---------+-----------+----------+--------------+ PFV      Full                    Yes                                 +---------+---------------+---------+-----------+----------+--------------+ POP      Full           Yes      Yes                                 +---------+---------------+---------+-----------+----------+--------------+ PTV      Full                                                        +---------+---------------+---------+-----------+----------+--------------+  GSV      Full                    Yes                                 +---------+---------------+---------+-----------+----------+--------------+ SSV      Full                                                        +---------+---------------+---------+-----------+----------+--------------+   Findings reported to [facility] with answering service (office was closed) at 17:02.  Summary: BILATERAL: - No evidence of deep vein thrombosis seen in the lower extremities, bilaterally. - No evidence of superficial venous thrombosis in the lower extremities, bilaterally. -No evidence of popliteal cyst, bilaterally.   *See table(s) above for measurements and observations.    Preliminary

## 2020-07-28 ENCOUNTER — Telehealth: Payer: Self-pay

## 2020-07-28 LAB — CANCER ANTIGEN 27.29: CA 27.29: 41.9 U/mL — ABNORMAL HIGH (ref 0.0–38.6)

## 2020-07-28 NOTE — Telephone Encounter (Signed)
Per Pt's request left her INR results on her voicemail.

## 2020-08-05 ENCOUNTER — Telehealth: Payer: Self-pay

## 2020-08-05 NOTE — Telephone Encounter (Signed)
Unable to reach pt to return her call. Pt called in to report she still had some swelling in her leg from her previous injection that resulted in a hematoma. Per Dr. Jana Hakim ok to skip injection on 08/06/20 and resume in April. Contacted son and gave him the message.

## 2020-08-06 ENCOUNTER — Inpatient Hospital Stay: Payer: Medicare Other

## 2020-08-10 ENCOUNTER — Telehealth: Payer: Self-pay

## 2020-08-14 ENCOUNTER — Other Ambulatory Visit: Payer: Self-pay | Admitting: Oncology

## 2020-08-14 DIAGNOSIS — M84559P Pathological fracture in neoplastic disease, hip, unspecified, subsequent encounter for fracture with malunion: Secondary | ICD-10-CM

## 2020-08-14 MED ORDER — HYDROCODONE-ACETAMINOPHEN 5-325 MG PO TABS: 1.0000 | ORAL_TABLET | Freq: Four times a day (QID) | ORAL | 0 refills | Status: DC | PRN

## 2020-08-28 ENCOUNTER — Ambulatory Visit: Payer: Medicare Other | Admitting: Internal Medicine

## 2020-09-03 ENCOUNTER — Inpatient Hospital Stay: Payer: Medicare Other

## 2020-09-03 ENCOUNTER — Inpatient Hospital Stay: Payer: Medicare Other | Attending: Oncology

## 2020-09-03 ENCOUNTER — Other Ambulatory Visit: Payer: Self-pay

## 2020-09-03 VITALS — BP 148/62 | HR 72 | Temp 98.2°F | Resp 18

## 2020-09-03 DIAGNOSIS — C7951 Secondary malignant neoplasm of bone: Secondary | ICD-10-CM | POA: Diagnosis not present

## 2020-09-03 DIAGNOSIS — Z86718 Personal history of other venous thrombosis and embolism: Secondary | ICD-10-CM | POA: Diagnosis not present

## 2020-09-03 DIAGNOSIS — C50411 Malignant neoplasm of upper-outer quadrant of right female breast: Secondary | ICD-10-CM

## 2020-09-03 DIAGNOSIS — Z5111 Encounter for antineoplastic chemotherapy: Secondary | ICD-10-CM | POA: Insufficient documentation

## 2020-09-03 DIAGNOSIS — Z7901 Long term (current) use of anticoagulants: Secondary | ICD-10-CM | POA: Insufficient documentation

## 2020-09-03 DIAGNOSIS — M899 Disorder of bone, unspecified: Secondary | ICD-10-CM

## 2020-09-03 DIAGNOSIS — I82401 Acute embolism and thrombosis of unspecified deep veins of right lower extremity: Secondary | ICD-10-CM

## 2020-09-03 DIAGNOSIS — Z170 Estrogen receptor positive status [ER+]: Secondary | ICD-10-CM | POA: Insufficient documentation

## 2020-09-03 DIAGNOSIS — M84659P Pathological fracture in other disease, hip, unspecified, subsequent encounter for fracture with malunion: Secondary | ICD-10-CM

## 2020-09-03 LAB — PROTIME-INR
INR: 1.9 — ABNORMAL HIGH (ref 0.8–1.2)
Prothrombin Time: 20.9 seconds — ABNORMAL HIGH (ref 11.4–15.2)

## 2020-09-03 LAB — CBC WITH DIFFERENTIAL (CANCER CENTER ONLY)
Abs Immature Granulocytes: 0.03 10*3/uL (ref 0.00–0.07)
Basophils Absolute: 0.1 10*3/uL (ref 0.0–0.1)
Basophils Relative: 1 %
Eosinophils Absolute: 0.2 10*3/uL (ref 0.0–0.5)
Eosinophils Relative: 2 %
HCT: 41.7 % (ref 36.0–46.0)
Hemoglobin: 13.2 g/dL (ref 12.0–15.0)
Immature Granulocytes: 0 %
Lymphocytes Relative: 13 %
Lymphs Abs: 1.1 10*3/uL (ref 0.7–4.0)
MCH: 28.4 pg (ref 26.0–34.0)
MCHC: 31.7 g/dL (ref 30.0–36.0)
MCV: 89.9 fL (ref 80.0–100.0)
Monocytes Absolute: 1 10*3/uL (ref 0.1–1.0)
Monocytes Relative: 12 %
Neutro Abs: 6.1 10*3/uL (ref 1.7–7.7)
Neutrophils Relative %: 72 %
Platelet Count: 255 10*3/uL (ref 150–400)
RBC: 4.64 MIL/uL (ref 3.87–5.11)
RDW: 14.9 % (ref 11.5–15.5)
WBC Count: 8.5 10*3/uL (ref 4.0–10.5)
nRBC: 0 % (ref 0.0–0.2)

## 2020-09-03 LAB — CMP (CANCER CENTER ONLY)
ALT: 14 U/L (ref 0–44)
AST: 14 U/L — ABNORMAL LOW (ref 15–41)
Albumin: 3.9 g/dL (ref 3.5–5.0)
Alkaline Phosphatase: 106 U/L (ref 38–126)
Anion gap: 10 (ref 5–15)
BUN: 18 mg/dL (ref 8–23)
CO2: 28 mmol/L (ref 22–32)
Calcium: 11 mg/dL — ABNORMAL HIGH (ref 8.9–10.3)
Chloride: 103 mmol/L (ref 98–111)
Creatinine: 0.91 mg/dL (ref 0.44–1.00)
GFR, Estimated: >60 mL/min (ref >60)
Glucose, Bld: 104 mg/dL — ABNORMAL HIGH (ref 70–99)
Potassium: 4.4 mmol/L (ref 3.5–5.1)
Sodium: 141 mmol/L (ref 135–145)
Total Bilirubin: 0.3 mg/dL (ref 0.3–1.2)
Total Protein: 7.3 g/dL (ref 6.5–8.1)

## 2020-09-03 MED ORDER — FULVESTRANT 250 MG/5ML IM SOLN
500.0000 mg | Freq: Once | INTRAMUSCULAR | Status: AC
  Administered 2020-09-03: 500 mg via INTRAMUSCULAR

## 2020-09-03 MED ORDER — FULVESTRANT 250 MG/5ML IM SOLN
INTRAMUSCULAR | Status: AC
  Filled 2020-09-03: qty 10

## 2020-09-03 NOTE — Patient Instructions (Signed)
Fulvestrant injection What is this medicine? FULVESTRANT (ful VES trant) blocks the effects of estrogen. It is used to treat breast cancer. This medicine may be used for other purposes; ask your health care provider or pharmacist if you have questions. COMMON BRAND NAME(S): FASLODEX What should I tell my health care provider before I take this medicine? They need to know if you have any of these conditions:  bleeding disorders  liver disease  low blood counts, like low white cell, platelet, or red cell counts  an unusual or allergic reaction to fulvestrant, other medicines, foods, dyes, or preservatives  pregnant or trying to get pregnant  breast-feeding How should I use this medicine? This medicine is for injection into a muscle. It is usually given by a health care professional in a hospital or clinic setting. Talk to your pediatrician regarding the use of this medicine in children. Special care may be needed. Overdosage: If you think you have taken too much of this medicine contact a poison control center or emergency room at once. NOTE: This medicine is only for you. Do not share this medicine with others. What if I miss a dose? It is important not to miss your dose. Call your doctor or health care professional if you are unable to keep an appointment. What may interact with this medicine?  medicines that treat or prevent blood clots like warfarin, enoxaparin, dalteparin, apixaban, dabigatran, and rivaroxaban This list may not describe all possible interactions. Give your health care provider a list of all the medicines, herbs, non-prescription drugs, or dietary supplements you use. Also tell them if you smoke, drink alcohol, or use illegal drugs. Some items may interact with your medicine. What should I watch for while using this medicine? Your condition will be monitored carefully while you are receiving this medicine. You will need important blood work done while you are taking  this medicine. Do not become pregnant while taking this medicine or for at least 1 year after stopping it. Women of child-bearing potential will need to have a negative pregnancy test before starting this medicine. Women should inform their doctor if they wish to become pregnant or think they might be pregnant. There is a potential for serious side effects to an unborn child. Men should inform their doctors if they wish to father a child. This medicine may lower sperm counts. Talk to your health care professional or pharmacist for more information. Do not breast-feed an infant while taking this medicine or for 1 year after the last dose. What side effects may I notice from receiving this medicine? Side effects that you should report to your doctor or health care professional as soon as possible:  allergic reactions like skin rash, itching or hives, swelling of the face, lips, or tongue  feeling faint or lightheaded, falls  pain, tingling, numbness, or weakness in the legs  signs and symptoms of infection like fever or chills; cough; flu-like symptoms; sore throat  vaginal bleeding Side effects that usually do not require medical attention (report to your doctor or health care professional if they continue or are bothersome):  aches, pains  constipation  diarrhea  headache  hot flashes  nausea, vomiting  pain at site where injected  stomach pain This list may not describe all possible side effects. Call your doctor for medical advice about side effects. You may report side effects to FDA at 1-800-FDA-1088. Where should I keep my medicine? This drug is given in a hospital or clinic and will   not be stored at home. NOTE: This sheet is a summary. It may not cover all possible information. If you have questions about this medicine, talk to your doctor, pharmacist, or health care provider.  2020 Elsevier/Gold Standard (2017-08-24 11:34:41)  

## 2020-09-04 LAB — CANCER ANTIGEN 27.29: CA 27.29: 54.6 U/mL — ABNORMAL HIGH (ref 0.0–38.6)

## 2020-09-07 ENCOUNTER — Ambulatory Visit (INDEPENDENT_AMBULATORY_CARE_PROVIDER_SITE_OTHER): Payer: Medicare Other

## 2020-09-07 ENCOUNTER — Ambulatory Visit: Payer: Medicare Other

## 2020-09-07 DIAGNOSIS — Z0000 Encounter for general adult medical examination without abnormal findings: Secondary | ICD-10-CM

## 2020-09-07 NOTE — Patient Instructions (Addendum)
Alicia Vasquez , Thank you for taking time to come for your Medicare Wellness Visit. I appreciate your ongoing commitment to your health goals. Please review the following plan we discussed and let me know if I can assist you in the future.   Screening recommendations/referrals: Colonoscopy: No longer required  Mammogram:  Done 01/28/20, Continued monitoring needed  Bone Density: Done 09/20/16 Recommended yearly ophthalmology/optometry visit for glaucoma screening and checkup Recommended yearly dental visit for hygiene and checkup  Vaccinations: Influenza vaccine: Due and discussed Pneumococcal vaccine: Due and discussed Tdap vaccine: Due and discussed Shingles vaccine: Shingrix discussed. Please contact your pharmacy for coverage information.    Covid-19:Declined at this time   Advanced directives: Advance directive discussed with you today. I have provided a copy for you to complete at home and have notarized. Once this is complete please bring a copy in to our office so we can scan it into your chart.  Conditions/risks identified: Be able to walk again  Next appointment: Follow up in one year for your annual wellness visit    Preventive Care 65 Years and Older, Female Preventive care refers to lifestyle choices and visits with your health care provider that can promote health and wellness. What does preventive care include?  A yearly physical exam. This is also called an annual well check.  Dental exams once or twice a year.  Routine eye exams. Ask your health care provider how often you should have your eyes checked.  Personal lifestyle choices, including:  Daily care of your teeth and gums.  Regular physical activity.  Eating a healthy diet.  Avoiding tobacco and drug use.  Limiting alcohol use.  Practicing safe sex.  Taking low-dose aspirin every day.  Taking vitamin and mineral supplements as recommended by your health care provider. What happens during an annual  well check? The services and screenings done by your health care provider during your annual well check will depend on your age, overall health, lifestyle risk factors, and family history of disease. Counseling  Your health care provider may ask you questions about your:  Alcohol use.  Tobacco use.  Drug use.  Emotional well-being.  Home and relationship well-being.  Sexual activity.  Eating habits.  History of falls.  Memory and ability to understand (cognition).  Work and work Statistician.  Reproductive health. Screening  You may have the following tests or measurements:  Height, weight, and BMI.  Blood pressure.  Lipid and cholesterol levels. These may be checked every 5 years, or more frequently if you are over 110 years old.  Skin check.  Lung cancer screening. You may have this screening every year starting at age 61 if you have a 30-pack-year history of smoking and currently smoke or have quit within the past 15 years.  Fecal occult blood test (FOBT) of the stool. You may have this test every year starting at age 18.  Flexible sigmoidoscopy or colonoscopy. You may have a sigmoidoscopy every 5 years or a colonoscopy every 10 years starting at age 32.  Hepatitis C blood test.  Hepatitis B blood test.  Sexually transmitted disease (STD) testing.  Diabetes screening. This is done by checking your blood sugar (glucose) after you have not eaten for a while (fasting). You may have this done every 1-3 years.  Bone density scan. This is done to screen for osteoporosis. You may have this done starting at age 2.  Mammogram. This may be done every 1-2 years. Talk to your health care provider  about how often you should have regular mammograms. Talk with your health care provider about your test results, treatment options, and if necessary, the need for more tests. Vaccines  Your health care provider may recommend certain vaccines, such as:  Influenza vaccine. This  is recommended every year.  Tetanus, diphtheria, and acellular pertussis (Tdap, Td) vaccine. You may need a Td booster every 10 years.  Zoster vaccine. You may need this after age 66.  Pneumococcal 13-valent conjugate (PCV13) vaccine. One dose is recommended after age 80.  Pneumococcal polysaccharide (PPSV23) vaccine. One dose is recommended after age 23. Talk to your health care provider about which screenings and vaccines you need and how often you need them. This information is not intended to replace advice given to you by your health care provider. Make sure you discuss any questions you have with your health care provider. Document Released: 06/12/2015 Document Revised: 02/03/2016 Document Reviewed: 03/17/2015 Elsevier Interactive Patient Education  2017 [employer] Prevention in the Home Falls can cause injuries. They can happen to people of all ages. There are many things you can do to make your home safe and to help prevent falls. What can I do on the outside of my home?  Regularly fix the edges of walkways and driveways and fix any cracks.  Remove anything that might make you trip as you walk through a door, such as a raised step or threshold.  Trim any bushes or trees on the path to your home.  Use bright outdoor lighting.  Clear any walking paths of anything that might make someone trip, such as rocks or tools.  Regularly check to see if handrails are loose or broken. Make sure that both sides of any steps have handrails.  Any raised decks and porches should have guardrails on the edges.  Have any leaves, snow, or ice cleared regularly.  Use sand or salt on walking paths during winter.  Clean up any spills in your garage right away. This includes oil or grease spills. What can I do in the bathroom?  Use night lights.  Install grab bars by the toilet and in the tub and shower. Do not use towel bars as grab bars.  Use non-skid mats or decals in the tub or  shower.  If you need to sit down in the shower, use a plastic, non-slip stool.  Keep the floor dry. Clean up any water that spills on the floor as soon as it happens.  Remove soap buildup in the tub or shower regularly.  Attach bath mats securely with double-sided non-slip rug tape.  Do not have throw rugs and other things on the floor that can make you trip. What can I do in the bedroom?  Use night lights.  Make sure that you have a light by your bed that is easy to reach.  Do not use any sheets or blankets that are too big for your bed. They should not hang down onto the floor.  Have a firm chair that has side arms. You can use this for support while you get dressed.  Do not have throw rugs and other things on the floor that can make you trip. What can I do in the kitchen?  Clean up any spills right away.  Avoid walking on wet floors.  Keep items that you use a lot in easy-to-reach places.  If you need to reach something above you, use a strong step stool that has a grab bar.  Keep electrical cords out of the way.  Do not use floor polish or wax that makes floors slippery. If you must use wax, use non-skid floor wax.  Do not have throw rugs and other things on the floor that can make you trip. What can I do with my stairs?  Do not leave any items on the stairs.  Make sure that there are handrails on both sides of the stairs and use them. Fix handrails that are broken or loose. Make sure that handrails are as long as the stairways.  Check any carpeting to make sure that it is firmly attached to the stairs. Fix any carpet that is loose or worn.  Avoid having throw rugs at the top or bottom of the stairs. If you do have throw rugs, attach them to the floor with carpet tape.  Make sure that you have a light switch at the top of the stairs and the bottom of the stairs. If you do not have them, ask someone to add them for you. What else can I do to help prevent  falls?  Wear shoes that:  Do not have high heels.  Have rubber bottoms.  Are comfortable and fit you well.  Are closed at the toe. Do not wear sandals.  If you use a stepladder:  Make sure that it is fully opened. Do not climb a closed stepladder.  Make sure that both sides of the stepladder are locked into place.  Ask someone to hold it for you, if possible.  Clearly mark and make sure that you can see:  Any grab bars or handrails.  First and last steps.  Where the edge of each step is.  Use tools that help you move around (mobility aids) if they are needed. These include:  Canes.  Walkers.  Scooters.  Crutches.  Turn on the lights when you go into a dark area. Replace any light bulbs as soon as they burn out.  Set up your furniture so you have a clear path. Avoid moving your furniture around.  If any of your floors are uneven, fix them.  If there are any pets around you, be aware of where they are.  Review your medicines with your doctor. Some medicines can make you feel dizzy. This can increase your chance of falling. Ask your doctor what other things that you can do to help prevent falls. This information is not intended to replace advice given to you by your health care provider. Make sure you discuss any questions you have with your health care provider. Document Released: 03/12/2009 Document Revised: 10/22/2015 Document Reviewed: 06/20/2014 Elsevier Interactive Patient Education  2017 [employer].

## 2020-09-07 NOTE — Progress Notes (Addendum)
Virtual Visit via Telephone Note  I connected with  Alicia Vasquez on 09/07/20 at  1:00 PM EDT by telephone and verified that I am speaking with the correct person using two identifiers.  Medicare Annual Wellness visit completed telephonically due to Covid-19 pandemic.   Persons participating in this call: This Health Coach and this patient.   Location: Patient: Home Provider: Office   I discussed the limitations, risks, security and privacy concerns of performing an evaluation and management service by telephone and the availability of in person appointments. The patient expressed understanding and agreed to proceed.  Unable to perform video visit due to video visit attempted and failed and/or patient does not have video capability.   Some vital signs may be absent or patient reported.   Willette Brace, LPN    Subjective:   Alicia Vasquez is a 80 y.o. female who presents for Medicare Annual (Subsequent) preventive examination.  Review of Systems     Cardiac Risk Factors include: advanced age (>56men, >33 women);obesity (BMI >30kg/m2);hypertension     Objective:    There were no vitals filed for this visit. There is no height or weight on file to calculate BMI.  Advanced Directives 09/07/2020 07/09/2020 01/22/2020 06/04/2019 02/07/2019 06/15/2017 05/16/2017  Does Patient Have a Medical Advance Directive? No No No No No No No  Does patient want to make changes to medical advance directive? - - - - No - Patient declined - -  Would patient like information on creating a medical advance directive? Yes (MAU/Ambulatory/Procedural Areas - Information given) No - Patient declined No - Patient declined Yes (ED - Information included in AVS) - - -    Current Medications (verified) Outpatient Encounter Medications as of 09/07/2020  Medication Sig  . B-D TB SYRINGE 1CC/27GX1/2" 27G X 1/2" 1 ML MISC USE TO DRAW UP 1 ML LIDOCAINE AND INJECT INTO CEFTRIAXONE VIAL FOR DILUTION. CHANGE  TO 25G NEEDLE FOR ADMINISTRATION  . Cholecalciferol (VITAMIN D3) 5000 UNITS TABS Take 5,000 Units by mouth daily.   . clotrimazole-betamethasone (LOTRISONE) lotion Apply topically 2 (two) times daily.  Marland Kitchen HYDROcodone-acetaminophen (NORCO/VICODIN) 5-325 MG tablet Take 1 tablet by mouth every 6 (six) hours as needed for moderate pain.  Marland Kitchen ketoconazole (NIZORAL) 2 % cream Apply 1 application topically daily.  Marland Kitchen lidocaine (XYLOCAINE) 1 % (with preservative) injection   . NEEDLE, DISP, 25 G 25G X 1-1/2" MISC Please use to administer rocephin injection  . SYNTHROID 125 MCG tablet TAKE 1 TABLET BY MOUTH EVERY DAY BEFORE BREAKFAST  . Syringe, Disposable, 5 ML MISC Please use to inject rocephin injection  . vitamin C (ASCORBIC ACID) 250 MG tablet Take 250 mg by mouth 3 (three) times daily.  Marland Kitchen warfarin (COUMADIN) 5 MG tablet Take 7.5 mg daily.  Marland Kitchen doxycycline (VIBRA-TABS) 100 MG tablet Take 1 tablet (100 mg total) by mouth 2 (two) times daily. (Patient not taking: Reported on 09/07/2020)   No facility-administered encounter medications on file as of 09/07/2020.    Allergies (verified) Anesthetics, amide; Benadryl [diphenhydramine hcl]; Carbocaine [mepivacaine hcl]; Codeine; Epinephrine; Sulfa antibiotics; Diphenhydramine; Latex; Tramadol; and Penicillins   History: Past Medical History:  Diagnosis Date  . Allergy   . Anxiety   . Arthritis   . Blood transfusion without reported diagnosis   . Breast cancer ([place]) 07/12/2013   Invasive Mammary Carcinoma  . DVT (deep vein thrombosis) in pregnancy   . Hypertension   . Hypothyroid   . Metastatic cancer to bone ([place]) dx'd  12/2016   hip  . Personal history of radiation therapy   . Pneumonia   . Radiation 11/21/13-01/07/14   Right Breast/Supraclavicular   Past Surgical History:  Procedure Laterality Date  . BREAST LUMPECTOMY Left 2015  . BREAST LUMPECTOMY WITH RADIOACTIVE SEED LOCALIZATION Right 09/09/2013   Procedure: BREAST LUMPECTOMY WITH RADIOACTIVE  SEED LOCALIZATION WITH AXILLARY NODE EXCISION;  Surgeon: [name], MD;  Location: [facility];  Service: General;  Laterality: Right;  . DENTAL SURGERY  04/19/2012   13 TEETH REMOVED  . DILATION AND CURETTAGE OF UTERUS    . ORIF PERIPROSTHETIC FRACTURE Left 01/31/2017   Procedure: REVISION and OPEN REDUCTION INTERNAL FIXATION (ORIF) PERIPROSTHETIC FRACTURE LEFT HIP;  Surgeon: Paralee Cancel, MD;  Location: [facility];  Service: Orthopedics;  Laterality: Left;  120 mins  . RE-EXCISION OF BREAST LUMPECTOMY Right 09/24/2013   Procedure: RE-EXCISION OF RIGHT BREAST LUMPECTOMY;  Surgeon: [name], MD;  Location: [facility];  Service: General;  Laterality: Right;  . TOTAL HIP ARTHROPLASTY Left 01/16/2017   Procedure: TOTAL HIP ARTHROPLASTY POSTERIOR;  Surgeon: Paralee Cancel, MD;  Location: [facility];  Service: Orthopedics;  Laterality: Left;   Family History  Problem Relation Age of Onset  . Heart disease Brother   . Colon cancer Brother   . Prostate cancer Brother    Social History   Socioeconomic History  . Marital status: Widowed    Spouse name: Not on file  . Number of children: 1  . Years of education: Not on file  . Highest education level: Not on file  Occupational History  . Not on file  Tobacco Use  . Smoking status: Never Smoker  . Smokeless tobacco: Never Used  Substance and Sexual Activity  . Alcohol use: No    Alcohol/week: 0.0 standard drinks  . Drug use: No  . Sexual activity: Never  Other Topics Concern  . Not on file  Social History Narrative   Exercise: yard work.   Social Determinants of Health   Financial Resource Strain: Low Risk   . Difficulty of Paying Living Expenses: Not hard at all  Food Insecurity: No Food Insecurity  . Worried About Charity fundraiser in the Last Year: Never true  . Ran Out of Food in the Last Year: Never true  Transportation Needs: No Transportation Needs  . Lack of Transportation (Medical): No   . Lack of Transportation (Non-Medical): No  Physical Activity: Inactive  . Days of Exercise per Week: 0 days  . Minutes of Exercise per Session: 0 min  Stress: No Stress Concern Present  . Feeling of Stress : Not at all  Social Connections: Socially Isolated  . Frequency of Communication with Friends and Family: More than three times a week  . Frequency of Social Gatherings with Friends and Family: Not on file  . Attends Religious Services: Never  . Active Member of Clubs or Organizations: No  . Attends Archivist Meetings: Never  . Marital Status: Widowed    Tobacco Counseling Counseling given: Not Answered   Clinical Intake:  Pre-visit preparation completed: Yes  Pain : No/denies pain     BMI - recorded: 39.91 Nutritional Status: BMI > 30  Obese Nutritional Risks: None Diabetes: No  How often do you need to have someone help you when you read instructions, pamphlets, or other written materials from your doctor or pharmacy?: 1 - Never  Diabetic?No  Interpreter Needed?: No  Information entered by :: Charlott Rakes,  LPN   Activities of Daily Living In your present state of health, do you have any difficulty performing the following activities: 09/07/2020 04/27/2020  Hearing? Tempie Donning  Vision? N N  Difficulty concentrating or making decisions? N N  Walking or climbing stairs? Y Y  Comment unable walk with walker  Dressing or bathing? N N  Doing errands, shopping? N N  Preparing Food and eating ? Y -  Comment friend prepares meals -  Using the Toilet? N -  In the past six months, have you accidently leaked urine? Y -  Comment occassionally -  Do you have problems with loss of bowel control? N -  Managing your Medications? N -  Managing your Finances? N -  Housekeeping or managing your Housekeeping? N -  Some recent data might be hidden    Patient Care Team: [name], MD as PCP - General (Family Medicine) Philemon Kingdom, MD as Consulting  Physician (Internal Medicine) Caprice Renshaw, MD as Referring Physician (Internal Medicine) Magrinat, [name], MD as Consulting Physician (Oncology) [name], DDS as Consulting Physician (Dentistry) [name] Tamala Fothergill, DPM as Consulting Physician (Podiatry)  Indicate any recent Medical Services you may have received from other than [facility] providers in the past year (date may be approximate).     Assessment:   This is a routine wellness examination for [facility].  Hearing/Vision screen  Hearing Screening   125Hz  250Hz  500Hz  1000Hz  2000Hz  3000Hz  4000Hz  6000Hz  8000Hz   Right ear:           Left ear:           Comments: Pt states HOH   Vision Screening Comments: Pt follows up with Dr Valetta Close for annual eye care   Dietary issues and exercise activities discussed: Current Exercise Habits: The patient does not participate in regular exercise at present  Goals    . Patient Stated     Wants to walk again!  Keep working on your exercises and eating well     . Patient Stated     Walk again without a walker       Depression Screen PHQ 2/9 Scores 09/07/2020 04/27/2020 06/15/2017 04/26/2017 04/12/2017 12/05/2016 11/11/2015  PHQ - 2 Score 0 0 0 1 1 0 0    Fall Risk Fall Risk  09/07/2020 04/27/2020 06/15/2017 04/26/2017 04/12/2017  Falls in the past year? 0 0 No Yes Yes  Number falls in past yr: 0 - - 1 1  Injury with Fall? 0 - - Yes Yes  Risk Factor Category  - - - High Fall Risk High Fall Risk  Risk for fall due to : Impaired vision;Impaired mobility - - History of fall(s);Impaired balance/gait;Impaired mobility;Medication side effect History of fall(s);Impaired balance/gait;Medication side effect;Impaired mobility  Follow up Falls prevention discussed - - Education provided;Falls prevention discussed Falls evaluation completed    FALL RISK PREVENTION PERTAINING TO THE HOME:  Any stairs in or around the home? No  If so, are there any without handrails? No  Home free of loose throw rugs in  walkways, pet beds, electrical cords, etc? Yes  Adequate lighting in your home to reduce risk of falls? Yes   ASSISTIVE DEVICES UTILIZED TO PREVENT FALLS:  Life alert? No  Use of a cane, walker or w/c? Yes  Grab bars in the bathroom? Yes  Shower chair or bench in shower? Yes  Elevated toilet seat or a handicapped toilet? Yes   TIMED UP AND GO:  Was the test performed?  No .      Cognitive Function:     6CIT Screen 09/07/2020  What Year? 0 points  What month? 0 points  Count back from 20 0 points  Months in reverse 0 points  Repeat phrase 0 points    Immunizations Immunization History  Administered Date(s) Administered  . Pneumococcal-Unspecified 07/20/2009    TDAP status: Due, Education has been provided regarding the importance of this vaccine. Advised may receive this vaccine at local pharmacy or Health Dept. Aware to provide a copy of the vaccination record if obtained from local pharmacy or Health Dept. Verbalized acceptance and understanding.  Flu Vaccine status: Due, Education has been provided regarding the importance of this vaccine. Advised may receive this vaccine at local pharmacy or Health Dept. Aware to provide a copy of the vaccination record if obtained from local pharmacy or Health Dept. Verbalized acceptance and understanding.  Pneumococcal vaccine status: Due, Education has been provided regarding the importance of this vaccine. Advised may receive this vaccine at local pharmacy or Health Dept. Aware to provide a copy of the vaccination record if obtained from local pharmacy or Health Dept. Verbalized acceptance and understanding.  Covid-19 vaccine status: Declined, Education has been provided regarding the importance of this vaccine but patient still declined. Advised may receive this vaccine at local pharmacy or Health Dept.or vaccine clinic. Aware to provide a copy of the vaccination record if obtained from local pharmacy or Health Dept. Verbalized  acceptance and understanding.  Qualifies for Shingles Vaccine? Yes   Zostavax completed No   Shingrix Completed?: No.    Education has been provided regarding the importance of this vaccine. Patient has been advised to call insurance company to determine out of pocket expense if they have not yet received this vaccine. Advised may also receive vaccine at local pharmacy or Health Dept. Verbalized acceptance and understanding.  Screening Tests Health Maintenance  Topic Date Due  . COVID-19 Vaccine (1) Never done  . TETANUS/TDAP  04/27/2021 (Originally 07/14/1959)  . PNA vac Low Risk Adult (2 of 2 - PCV13) 04/27/2021 (Originally 07/20/2010)  . INFLUENZA VACCINE  12/28/2020  . DEXA SCAN  Completed  . HPV VACCINES  Aged Out    Health Maintenance  Health Maintenance Due  Topic Date Due  . COVID-19 Vaccine (1) Never done    Colorectal cancer screening: No longer required.   Mammogram status: Per oncology. Needs continued monitoring.   Bone Density status: Completed 09/20/16. Results reflect: Bone density results: OSTEOPENIA. Repeat every 2 years.   Additional Screening:  Vision Screening: Recommended annual ophthalmology exams for early detection of glaucoma and other disorders of the eye. Is the patient up to date with their annual eye exam?  Yes  Who is the provider or what is the name of the office in which the patient attends annual eye exams? Dr Valetta Close  If pt is not established with a provider, would they like to be referred to a provider to establish care? No .   Dental Screening: Recommended annual dental exams for proper oral hygiene  Community Resource Referral / Chronic Care Management: CRR required this visit?  No   CCM required this visit?  No      Plan:     I have personally reviewed and noted the following in the patient's chart:   . Medical and social history . Use of alcohol, tobacco or illicit drugs  . Current medications and supplements . Functional  ability and status . Nutritional status .  Physical activity . Advanced directives . List of other physicians . Hospitalizations, surgeries, and ER visits in previous 12 months . Vitals . Screenings to include cognitive, depression, and falls . Referrals and appointments  In addition, I have reviewed and discussed with patient certain preventive protocols, quality metrics, and best practice recommendations. A written personalized care plan for preventive services as well as general preventive health recommendations were provided to patient.     Willette Brace, LPN   10/24/7822   Nurse Notes: None

## 2020-09-30 NOTE — Progress Notes (Signed)
[place]  [phone] [phone]     ID: Alicia Vasquez OB: 08/04/1940  MR#: [id number]  ZHG#:[id number]  Patient Care Team: [name], MD as PCP - General (Family Medicine) Philemon Kingdom, MD as Consulting Physician (Internal Medicine) Caprice Renshaw, MD as Referring Physician (Internal Medicine) Captola Teschner, [name], MD as Consulting Physician (Oncology) [name], DDS as Consulting Physician (Dentistry) Regal, Tamala Fothergill, DPM as Consulting Physician (Podiatry) OTHER MD:   CHIEF COMPLAINT: Estrogen receptor positive breast cancer  CURRENT TREATMENT:  Fulvestrant; warfarin   INTERVAL HISTORY: Alicia Vasquez returns today for follow-up and treatment of her metastatic estrogen receptor positive breast cancer  She continues on fulvestrant.  She is tolerating this well. She tells me she feels tired a few days after each treatment but otherwise has no symptoms to report.  In particular she does not have significant pain or bleeding issues from the actual injections  She is taking Warfarin daily and is not having any easy bruising or bleeding. She is currently on 7.5 mg daily, with an acceptable INR in the 1.8-2.0 range.  REVIEW OF SYSTEMS: Alicia Vasquez had a significant left knee cellulitis last visit but that has resolved.  She tells me she has been sleeping in her recliner more than in the bed because it feels better on her back.  She has had no unusual headaches visual changes cough phlegm production pleurisy shortness of breath or change in bowel or bladder habits.  A detailed review of systems was otherwise stable  COVID 19 VACCINATION STATUS:    BREAST CANCER HISTORY: From doctor [name]'s intake node 07/24/2013:  "80 y.o. female. Who presented with SOB and had a CT chest performed that revealed a right breast mass. Mammogram/ultrsound on 2/13 showed a mass in the 11 o'clock position in the right breast measuring 1.5 cm. Also noted was a  right axillary LN measuring 1.6 cm. MRI not performed. Biopsy of mass and lymph done. 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 Lymph node + for metastatic carcinoma."  [On 09/09/2013 the patient underwent right lumpectomy and sentinel lymph node sampling. This showed ([id number]) multifocal invasive ductal carcinoma, grade 1, the largest lesion measuring 1.8 cm, the second lesion 1.2 cm. One of 4 sentinel lymph nodes was positive, with extracapsular extension. Margins were positive. HER-2 was repeated and was again negative. Further surgery 09/16/2013 obtained clear margins.  Her subsequent history is as detailed below   PAST MEDICAL HISTORY: Past Medical History:  Diagnosis Date  . Allergy   . Anxiety   . Arthritis   . Blood transfusion without reported diagnosis   . Breast cancer ([place]) 07/12/2013   Invasive Mammary Carcinoma  . DVT (deep vein thrombosis) in pregnancy   . Hypertension   . Hypothyroid   . 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 Personal history of radiation therapy   . Pneumonia   . Radiation 11/21/13-01/07/14   Right Breast/Supraclavicular    PAST SURGICAL HISTORY: Past Surgical History:  Procedure Laterality Date  . BREAST LUMPECTOMY Left 2015  . BREAST LUMPECTOMY WITH RADIOACTIVE SEED LOCALIZATION Right 09/09/2013   Procedure: BREAST LUMPECTOMY WITH RADIOACTIVE SEED LOCALIZATION WITH AXILLARY NODE EXCISION;  Surgeon: [name], MD;  Location: [facility];  Service: General;  Laterality: Right;  . DENTAL SURGERY  04/19/2012   13 TEETH REMOVED  . DILATION  AND CURETTAGE OF UTERUS    . ORIF PERIPROSTHETIC FRACTURE Left 01/31/2017   Procedure: REVISION and OPEN REDUCTION INTERNAL FIXATION (ORIF) PERIPROSTHETIC FRACTURE LEFT HIP;  Surgeon: Paralee Cancel, MD;  Location: [facility];  Service: Orthopedics;  Laterality: Left;  120  mins  . RE-EXCISION OF BREAST LUMPECTOMY Right 09/24/2013   Procedure: RE-EXCISION OF RIGHT BREAST LUMPECTOMY;  Surgeon: [name], MD;  Location: [facility];  Service: General;  Laterality: Right;  . TOTAL HIP ARTHROPLASTY Left 01/16/2017   Procedure: TOTAL HIP ARTHROPLASTY POSTERIOR;  Surgeon: Paralee Cancel, MD;  Location: [facility];  Service: Orthopedics;  Laterality: Left;    FAMILY HISTORY Family History  Problem Relation Age of Onset  . Heart disease Brother   . Colon cancer Brother   . Prostate cancer Brother    the patient's father died at the age of 60 after an automobile accident. The patient's mother died at the age of 65. She was a Marine scientist here in [name] in the [place]. She was infected with polio and was confined to a wheelchair for a good part of her life. She eventually died of pneumonia. The patient had one brother, who died with prostate cancer. She had no sisters. There is no history of breast or ovarian cancer in the family.   GYNECOLOGIC HISTORY:  Menarche age 80, first live birth age 35, the patient is GX P1. She went through the change of life at age 51. 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 She is widowed. Currently she is staying with her friend Alicia Vasquez, 63 years old, who is a retired Radio producer.  Alicia Vasquez tells me Alicia Vasquez has recently left her husband, put her children in foster care, and moved in with them.  The patient's son Alicia Vasquez lived in [place] but currently he lives in [place] only home.  He lost his wife in 2008, is not currently employed although he occasionally does odd jobs, Alicia Vasquez says.. The patient tells me if Alicia Vasquez dies she would be moving back into her own home with her son. The patient has no grandchildren. She is a Tourist information centre manager but currently attends a [employer] with her friend  Alicia Vasquez    ADVANCED DIRECTIVES: Not in place   HEALTH MAINTENANCE: Social History   Tobacco Use  . Smoking status: Never Smoker  . Smokeless tobacco: Never Used  Substance Use Topics  . Alcohol use: No    Alcohol/week: 0.0 standard drinks  . Drug use: No     Colonoscopy: Never  PAP:  Bone density: 09/20/2016 showed a T score of -2.2  Lipid panel:  Allergies  Allergen Reactions  . Anesthetics, [place] Hypertension  . Benadryl [Diphenhydramine Hcl] Other (See Comments)    Dizziness  . Carbocaine [Mepivacaine Hcl] Hypertension  . Codeine Other (See Comments)    Dizziness  . Epinephrine Hypertension  . Sulfa Antibiotics Other (See Comments)    dizziness  . Diphenhydramine   . Latex Other (See Comments)    Blisters in mouth  . Tramadol     Sedation.   Marland Kitchen Penicillins Rash    Has patient had a PCN reaction causing immediate rash, facial/tongue/throat swelling, SOB or lightheadedness with hypotension: Unknown Has patient had a PCN reaction causing severe rash involving mucus membranes or skin necrosis: Unknown Has patient had a PCN reaction that required hospitalization: Unknown Has patient had a PCN reaction  occurring within the last 10 years: Unknown If all of the above answers are "NO", then may proceed with Cephalosporin use.     Current Outpatient Medications  Medication Sig Dispense Refill  . B-D TB SYRINGE 1CC/27GX1/2" 27G X 1/2" 1 ML MISC USE TO DRAW UP 1 ML LIDOCAINE AND INJECT INTO CEFTRIAXONE VIAL FOR DILUTION. CHANGE TO 25G NEEDLE FOR ADMINISTRATION    . Cholecalciferol (VITAMIN D3) 5000 UNITS TABS Take 5,000 Units by mouth daily.     . clotrimazole-betamethasone (LOTRISONE) lotion Apply topically 2 (two) times daily. 60 mL 0  . doxycycline (VIBRA-TABS) 100 MG tablet Take 1 tablet (100 mg total) by mouth 2 (two) times daily. (Patient not taking: Reported on 09/07/2020) 20 tablet 0  . HYDROcodone-acetaminophen (NORCO/VICODIN) 5-325 MG tablet Take 1 tablet by  mouth every 6 (six) hours as needed for moderate pain. 120 tablet 0  . ketoconazole (NIZORAL) 2 % cream Apply 1 application topically daily. 15 g 0  . lidocaine (XYLOCAINE) 1 % (with preservative) injection     . NEEDLE, DISP, 25 G 25G X 1-1/2" MISC Please use to administer rocephin injection 15 each 0  . SYNTHROID 125 MCG tablet TAKE 1 TABLET BY MOUTH EVERY DAY BEFORE BREAKFAST 90 tablet 4  . Syringe, Disposable, 5 ML MISC Please use to inject rocephin injection 5 each 0  . vitamin C (ASCORBIC ACID) 250 MG tablet Take 250 mg by mouth 3 (three) times daily.    Marland Kitchen warfarin (COUMADIN) 5 MG tablet Take 7.5 mg daily. 270 tablet 1   No current facility-administered medications for this visit.    OBJECTIVE: white woman examined in a wheelchair  Vitals:   10/01/20 1154  BP: (!) 163/72  Pulse: 84  Resp: 19  Temp: 97.8 F (36.6 C)  SpO2: 95%   Wt Readings from Last 3 Encounters:  10/01/20 227 lb 4.8 oz (103.1 kg)  07/27/20 232 lb 8 oz (105.5 kg)  07/13/20 229 lb 8 oz (104.1 kg)   Body mass index is 39.02 kg/m.    ECOG FS:2 - Symptomatic, <50% confined to bed  Sclerae unicteric, EOMs intact Wearing a mask No cervical or supraclavicular adenopathy Lungs no rales or rhonchi Heart regular rate and rhythm Abd soft, nontender, positive bowel sounds MSK no focal spinal tenderness, no upper extremity lymphedema Neuro: nonfocal, well oriented, appropriate affect Breasts: The right breast is status post lumpectomy and radiation.  There is no evidence of local recurrence.  The left breast is benign.  Both axillae are benign.   LAB RESULTS:  CMP     Component Value Date/Time   NA 141 10/01/2020 1108   NA 141 05/16/2017 1416   K 4.1 10/01/2020 1108   K 3.8 05/16/2017 1416   CL 104 10/01/2020 1108   CO2 28 10/01/2020 1108   CO2 27 05/16/2017 1416   GLUCOSE 104 (H) 10/01/2020 1108   GLUCOSE 140 05/16/2017 1416   BUN 13 10/01/2020 1108   BUN 13.2 05/16/2017 1416   CREATININE 0.82  10/01/2020 1108   CREATININE 0.80 09/22/2017 1555   CREATININE 0.8 05/16/2017 1416   CALCIUM 10.8 (H) 10/01/2020 1108   CALCIUM 11.0 (H) 05/16/2017 1416   PROT 7.1 10/01/2020 1108   PROT 6.9 05/16/2017 1416   ALBUMIN 3.8 10/01/2020 1108   ALBUMIN 3.6 05/16/2017 1416   AST 13 (L) 10/01/2020 1108   AST 13 05/16/2017 1416   ALT 9 10/01/2020 1108   ALT <6 05/16/2017 1416   ALKPHOS 115 10/01/2020  1108   ALKPHOS 130 05/16/2017 1416   BILITOT 0.3 10/01/2020 1108   BILITOT 0.31 05/16/2017 1416   GFRNONAA >60 10/01/2020 1108   GFRNONAA 75 08/19/2015 1602   GFRAA >60 08/07/2019 1305   GFRAA >60 03/21/2018 1417   GFRAA 87 08/19/2015 1602    I No results found for: SPEP  Lab Results  Component Value Date   WBC 8.3 10/01/2020   NEUTROABS 6.0 10/01/2020   HGB 12.9 10/01/2020   HCT 41.1 10/01/2020   MCV 88.4 10/01/2020   PLT 270 10/01/2020      Chemistry      Component Value Date/Time   NA 141 10/01/2020 1108   NA 141 05/16/2017 1416   K 4.1 10/01/2020 1108   K 3.8 05/16/2017 1416   CL 104 10/01/2020 1108   CO2 28 10/01/2020 1108   CO2 27 05/16/2017 1416   BUN 13 10/01/2020 1108   BUN 13.2 05/16/2017 1416   CREATININE 0.82 10/01/2020 1108   CREATININE 0.80 09/22/2017 1555   CREATININE 0.8 05/16/2017 1416      Component Value Date/Time   CALCIUM 10.8 (H) 10/01/2020 1108   CALCIUM 11.0 (H) 05/16/2017 1416   ALKPHOS 115 10/01/2020 1108   ALKPHOS 130 05/16/2017 1416   AST 13 (L) 10/01/2020 1108   AST 13 05/16/2017 1416   ALT 9 10/01/2020 1108   ALT <6 05/16/2017 1416   BILITOT 0.3 10/01/2020 1108   BILITOT 0.31 05/16/2017 1416       No results found for: LABCA2  No components found for: LABCA125  Recent Labs  Lab 10/01/20 1109  INR 1.8*    Urinalysis    Component Value Date/Time   COLORURINE YELLOW 07/09/2020 1255   APPEARANCEUR HAZY (A) 07/09/2020 1255   LABSPEC 1.015 07/09/2020 1255   LABSPEC 1.010 09/22/2016 1553   PHURINE 6.0 07/09/2020 1255    GLUCOSEU NEGATIVE 07/09/2020 1255   GLUCOSEU Negative 09/22/2016 1553   HGBUR SMALL (A) 07/09/2020 1255   BILIRUBINUR NEGATIVE 07/09/2020 1255   BILIRUBINUR negative 02/14/2020 1644   BILIRUBINUR Negative 09/22/2016 1553   KETONESUR NEGATIVE 07/09/2020 1255   PROTEINUR NEGATIVE 07/09/2020 1255   UROBILINOGEN 0.2 02/14/2020 1644   UROBILINOGEN 0.2 11/15/2016 1358   UROBILINOGEN 0.2 09/22/2016 1553   NITRITE POSITIVE (A) 07/09/2020 1255   LEUKOCYTESUR SMALL (A) 07/09/2020 1255   LEUKOCYTESUR Negative 09/22/2016 1553    STUDIES: No results found.   ASSESSMENT: 80 y.o. [place] woman status post right breast upper outer quadrant lumpectomy and sentinel lymph node sampling 09/09/2013 for an mpT1c pN1a, stage IIA invasive ductal carcinoma, estrogen and progesterone receptor both 100% positive with strong staining intensity, MIB-1 of 17% and no HER-2 amplification  (1) additional surgery for margin clearance 09/16/2013 obtained negative margins  (2) Oncotype DX recurrence score of 4 predicts a risk of outside the breast recurrence within 10 years of 7% if the patient's only systemic therapy is tamoxifen for 5 years. It also predicts no benefit from chemotherapy  (3) adjuvant radiation completed 01/07/2014  (4) anastrozole started 02/27/2014 stopped within 2 weeks because of arm swelling.   (a) bone density April 2016 showed osteopenia, with a t-score of -1.6  (b) anastrozole resumed 12/17/2015  (c) Bone density 09/20/2016 showed a T score of -2.2  (5) history of left lower extremity DVT 11/23/2012, initially on rivaroxaban, which caused chest pain, switch to Coumadin July 2014  (b) coumadin dose increased to 7.5/10 mg alternating days as of 06/13/2017   METASTATIC DISEASE:  August 2018 (6) status post left total hip replacement 01/16/2017 for estrogen receptor positive adenocarcinoma.  (a) CA 27-29 was 46.4 as of 04/18/2017  (b) chest CT scan 05/02/2017 shows no lung or liver lesion  concern; it does show aortic atherosclerosis  (c) baseline bone scan 05/02/2017 was negative  (d) PET scan 09/12/2017 shows no active disease, including bone  (e) PET scan and CT chest on 05/29/2018 show no active disease  (f) chest CT and bone scan 03/18/2019 showed no evidence of active disease  (7) fulvestrant started 04/18/2017  (8) denosumab/Xgeva started 05/16/2017  (a) changed to every 12-weeks after 10/03/2017 dose   (b) held starting with 06/13/2018 dose due to dental concerns  (9) unprovoked deep vein thrombosis involving the left posterior tibial v documented 11/23/2012, resolved on repeat 06/27/2013 and not recurrent on multiple Dopplers since, most recent 03/06/2018  (a) on chronic anticoagulation with warfarin given ongoing risks (stage IV breast cancer, relative immobility secondary to claudication   PLAN:  Bular will soon be 4 years out from definitive diagnosis of metastatic disease.  She has chronic pain which as best as we can tell it is related to her disease and this is well controlled on a very stable dose of narcotics.  She tolerates these without confusion nausea or constipation.  She also tolerates her treatments well and we are continuing her fulvestrant and denosumab/Xgeva as before.  She will be due for repeat mammography in August.  That order has been entered.  She continues on warfarin for her unprovoked DVT, with acceptable INRs at the current dose  She will return to see me in 4 months.  She knows to call for any other issue admittable before then.  Total encounter time 25 minutes.Sarajane Jews C. Raileigh Sabater, MD 10/01/20 6:38 PM Medical Oncology and Hematology [facility] [place], [place] [address] Tel. [phone]    Fax. [phone]   I, Wilburn Mylar, am acting as scribe for Dr. [name]. Even Budlong.  I, Lurline Del MD, have reviewed the above documentation for accuracy and completeness, and I agree with the  above.    *Total Encounter Time as defined by the Centers for Medicare and Medicaid Services includes, in addition to the face-to-face time of a patient visit (documented in the note above) non-face-to-face time: obtaining and reviewing outside history, ordering and reviewing medications, tests or procedures, care coordination (communications with other health care professionals or caregivers) and documentation in the medical record.

## 2020-10-01 ENCOUNTER — Other Ambulatory Visit: Payer: Self-pay

## 2020-10-01 ENCOUNTER — Inpatient Hospital Stay: Payer: Medicare Other | Attending: Oncology

## 2020-10-01 ENCOUNTER — Inpatient Hospital Stay (HOSPITAL_BASED_OUTPATIENT_CLINIC_OR_DEPARTMENT_OTHER): Payer: Medicare Other | Admitting: Oncology

## 2020-10-01 ENCOUNTER — Inpatient Hospital Stay: Payer: Medicare Other

## 2020-10-01 VITALS — BP 163/72 | HR 84 | Temp 97.8°F | Resp 19 | Wt 227.3 lb

## 2020-10-01 DIAGNOSIS — Z170 Estrogen receptor positive status [ER+]: Secondary | ICD-10-CM | POA: Insufficient documentation

## 2020-10-01 DIAGNOSIS — Z79899 Other long term (current) drug therapy: Secondary | ICD-10-CM | POA: Diagnosis not present

## 2020-10-01 DIAGNOSIS — Z7901 Long term (current) use of anticoagulants: Secondary | ICD-10-CM | POA: Insufficient documentation

## 2020-10-01 DIAGNOSIS — C7951 Secondary malignant neoplasm of bone: Secondary | ICD-10-CM

## 2020-10-01 DIAGNOSIS — C50411 Malignant neoplasm of upper-outer quadrant of right female breast: Secondary | ICD-10-CM

## 2020-10-01 DIAGNOSIS — Z923 Personal history of irradiation: Secondary | ICD-10-CM | POA: Insufficient documentation

## 2020-10-01 DIAGNOSIS — Z86718 Personal history of other venous thrombosis and embolism: Secondary | ICD-10-CM | POA: Diagnosis not present

## 2020-10-01 DIAGNOSIS — Z5111 Encounter for antineoplastic chemotherapy: Secondary | ICD-10-CM | POA: Diagnosis not present

## 2020-10-01 DIAGNOSIS — M84659P Pathological fracture in other disease, hip, unspecified, subsequent encounter for fracture with malunion: Secondary | ICD-10-CM

## 2020-10-01 DIAGNOSIS — M8580 Other specified disorders of bone density and structure, unspecified site: Secondary | ICD-10-CM | POA: Diagnosis not present

## 2020-10-01 DIAGNOSIS — M84559P Pathological fracture in neoplastic disease, hip, unspecified, subsequent encounter for fracture with malunion: Secondary | ICD-10-CM | POA: Diagnosis not present

## 2020-10-01 DIAGNOSIS — I82401 Acute embolism and thrombosis of unspecified deep veins of right lower extremity: Secondary | ICD-10-CM

## 2020-10-01 DIAGNOSIS — M899 Disorder of bone, unspecified: Secondary | ICD-10-CM

## 2020-10-01 LAB — PROTIME-INR
INR: 1.8 — ABNORMAL HIGH (ref 0.8–1.2)
Prothrombin Time: 21.2 seconds — ABNORMAL HIGH (ref 11.4–15.2)

## 2020-10-01 LAB — CBC WITH DIFFERENTIAL (CANCER CENTER ONLY)
Abs Immature Granulocytes: 0.02 10*3/uL (ref 0.00–0.07)
Basophils Absolute: 0.1 10*3/uL (ref 0.0–0.1)
Basophils Relative: 1 %
Eosinophils Absolute: 0.3 10*3/uL (ref 0.0–0.5)
Eosinophils Relative: 3 %
HCT: 41.1 % (ref 36.0–46.0)
Hemoglobin: 12.9 g/dL (ref 12.0–15.0)
Immature Granulocytes: 0 %
Lymphocytes Relative: 12 %
Lymphs Abs: 1 10*3/uL (ref 0.7–4.0)
MCH: 27.7 pg (ref 26.0–34.0)
MCHC: 31.4 g/dL (ref 30.0–36.0)
MCV: 88.4 fL (ref 80.0–100.0)
Monocytes Absolute: 1 10*3/uL (ref 0.1–1.0)
Monocytes Relative: 12 %
Neutro Abs: 6 10*3/uL (ref 1.7–7.7)
Neutrophils Relative %: 72 %
Platelet Count: 270 10*3/uL (ref 150–400)
RBC: 4.65 MIL/uL (ref 3.87–5.11)
RDW: 14.7 % (ref 11.5–15.5)
WBC Count: 8.3 10*3/uL (ref 4.0–10.5)
nRBC: 0 % (ref 0.0–0.2)

## 2020-10-01 LAB — CMP (CANCER CENTER ONLY)
ALT: 9 U/L (ref 0–44)
AST: 13 U/L — ABNORMAL LOW (ref 15–41)
Albumin: 3.8 g/dL (ref 3.5–5.0)
Alkaline Phosphatase: 115 U/L (ref 38–126)
Anion gap: 9 (ref 5–15)
BUN: 13 mg/dL (ref 8–23)
CO2: 28 mmol/L (ref 22–32)
Calcium: 10.8 mg/dL — ABNORMAL HIGH (ref 8.9–10.3)
Chloride: 104 mmol/L (ref 98–111)
Creatinine: 0.82 mg/dL (ref 0.44–1.00)
GFR, Estimated: >60 mL/min (ref >60)
Glucose, Bld: 104 mg/dL — ABNORMAL HIGH (ref 70–99)
Potassium: 4.1 mmol/L (ref 3.5–5.1)
Sodium: 141 mmol/L (ref 135–145)
Total Bilirubin: 0.3 mg/dL (ref 0.3–1.2)
Total Protein: 7.1 g/dL (ref 6.5–8.1)

## 2020-10-01 MED ORDER — FULVESTRANT 250 MG/5ML IM SOLN
500.0000 mg | Freq: Once | INTRAMUSCULAR | Status: AC
  Administered 2020-10-01: 500 mg via INTRAMUSCULAR

## 2020-10-01 MED ORDER — FULVESTRANT 250 MG/5ML IM SOLN
INTRAMUSCULAR | Status: AC
  Filled 2020-10-01: qty 5

## 2020-10-01 MED ORDER — HYDROCODONE-ACETAMINOPHEN 5-325 MG PO TABS: 1.0000 | ORAL_TABLET | Freq: Four times a day (QID) | ORAL | 0 refills | Status: DC | PRN

## 2020-10-01 NOTE — Patient Instructions (Signed)
Fulvestrant injection What is this medicine? FULVESTRANT (ful VES trant) blocks the effects of estrogen. It is used to treat breast cancer. This medicine may be used for other purposes; ask your health care provider or pharmacist if you have questions. COMMON BRAND NAME(S): FASLODEX What should I tell my health care provider before I take this medicine? They need to know if you have any of these conditions:  bleeding disorders  liver disease  low blood counts, like low white cell, platelet, or red cell counts  an unusual or allergic reaction to fulvestrant, other medicines, foods, dyes, or preservatives  pregnant or trying to get pregnant  breast-feeding How should I use this medicine? This medicine is for injection into a muscle. It is usually given by a health care professional in a hospital or clinic setting. Talk to your pediatrician regarding the use of this medicine in children. Special care may be needed. Overdosage: If you think you have taken too much of this medicine contact a poison control center or emergency room at once. NOTE: This medicine is only for you. Do not share this medicine with others. What if I miss a dose? It is important not to miss your dose. Call your doctor or health care professional if you are unable to keep an appointment. What may interact with this medicine?  medicines that treat or prevent blood clots like warfarin, enoxaparin, dalteparin, apixaban, dabigatran, and rivaroxaban This list may not describe all possible interactions. Give your health care provider a list of all the medicines, herbs, non-prescription drugs, or dietary supplements you use. Also tell them if you smoke, drink alcohol, or use illegal drugs. Some items may interact with your medicine. What should I watch for while using this medicine? Your condition will be monitored carefully while you are receiving this medicine. You will need important blood work done while you are taking  this medicine. Do not become pregnant while taking this medicine or for at least 1 year after stopping it. Women of child-bearing potential will need to have a negative pregnancy test before starting this medicine. Women should inform their doctor if they wish to become pregnant or think they might be pregnant. There is a potential for serious side effects to an unborn child. Men should inform their doctors if they wish to father a child. This medicine may lower sperm counts. Talk to your health care professional or pharmacist for more information. Do not breast-feed an infant while taking this medicine or for 1 year after the last dose. What side effects may I notice from receiving this medicine? Side effects that you should report to your doctor or health care professional as soon as possible:  allergic reactions like skin rash, itching or hives, swelling of the face, lips, or tongue  feeling faint or lightheaded, falls  pain, tingling, numbness, or weakness in the legs  signs and symptoms of infection like fever or chills; cough; flu-like symptoms; sore throat  vaginal bleeding Side effects that usually do not require medical attention (report to your doctor or health care professional if they continue or are bothersome):  aches, pains  constipation  diarrhea  headache  hot flashes  nausea, vomiting  pain at site where injected  stomach pain This list may not describe all possible side effects. Call your doctor for medical advice about side effects. You may report side effects to FDA at 1-800-FDA-1088. Where should I keep my medicine? This drug is given in a hospital or clinic and will   not be stored at home. NOTE: This sheet is a summary. It may not cover all possible information. If you have questions about this medicine, talk to your doctor, pharmacist, or health care provider.  2021 Elsevier/Gold Standard (2017-08-24 11:34:41)  

## 2020-10-02 LAB — CANCER ANTIGEN 27.29: CA 27.29: 46.4 U/mL — ABNORMAL HIGH (ref 0.0–38.6)

## 2020-10-06 ENCOUNTER — Telehealth: Payer: Self-pay

## 2020-10-06 NOTE — Telephone Encounter (Signed)
Patient is calling in stating she concerns about her wellness visit with [name] on 09/07/20, and would like a call back from a manager.

## 2020-10-07 NOTE — Telephone Encounter (Signed)
Patient calling back she is very upset that tina put she no longer needs mammograms since she is dealing with Breast Cancer right now. Patient stated it was sent to her insurance and it has them concerned. Patient also stated that her Oncologist is upset that was put in her chart.  Call patient at this number - [phone]

## 2020-10-08 NOTE — Telephone Encounter (Signed)
Spoke with patient, she's very upset, I read over chart and messaged Hasna stated she is working from home and will call patient tomorrow.  Patient states I am just making excuses and she wants to talk to hasna today. I explained to patient hasna was working from home today and a was unable to speak to her. hasna will have to speak with tina and call her back tomorrow. patient then proceed to say that we have good drs. here but the rest of the staff makes it hard to come here . patient stated we don't allow her to talk to any  Doctor or get any appointments scheduled with any providers. She also states that we need to be empathetic to the patients because if we were in their shoes we would be upset. She also states that if she does not hear back from hasna tomorrow she will be calling the [employer].

## 2020-10-08 NOTE — Telephone Encounter (Signed)
I was waiting to get with [name] and discuss pt's concerns before I call her. [name] has responded to my questions this afternoon and corrected the documentation appropriately in patient's chart. Called pt this afternoon, she sounded upset about the whole situation, she was concerned that the previous documentation will affect her treatment, and said she is afraid that if Medicare got that not they may not approve her treatment. Appoligized the pt about this situation and informed her that [name] corrected the documentation to say : Mammogram: done 01/28/20 and continued monitoring needed. Patient requested that I mail her the progress note from [facility] visit, I advised her that I will mail it first thing in the morning, address verified; she also want a letter saying that this information got corrected at the Medicare part; informed her that I will get with Dr. Jerline Pain and see if there is any documentation that we should provide to Medicare as a proof that patient still needs treatment due to her Diagnosis despite age.  Patient voiced understanding and sounded less upset at the end of the conversation. Patient has no further question/concerns. Will route this note to Dr. Jerline Pain.

## 2020-10-08 NOTE — Progress Notes (Signed)
Mammogram is continuing related to current DX of breast cancer not discontinued due to age.

## 2020-10-09 NOTE — Telephone Encounter (Signed)
Noted. The note in question has been addended. This should not cause any issues with coverage for upcoming mammograms.   Alicia Vasquez. Jerline Pain, MD 10/09/2020 10:45 AM

## 2020-10-09 NOTE — Telephone Encounter (Signed)
A copy of AVS mailed to pt's address. Called patient and notified her and also went over Dr. Marigene Ehlers note.

## 2020-10-12 ENCOUNTER — Ambulatory Visit: Payer: Medicare Other | Admitting: Podiatry

## 2020-10-16 ENCOUNTER — Encounter: Payer: Self-pay | Admitting: Podiatry

## 2020-10-16 ENCOUNTER — Ambulatory Visit (INDEPENDENT_AMBULATORY_CARE_PROVIDER_SITE_OTHER): Payer: Medicare Other | Admitting: Podiatry

## 2020-10-16 ENCOUNTER — Other Ambulatory Visit: Payer: Self-pay

## 2020-10-16 DIAGNOSIS — M79674 Pain in right toe(s): Secondary | ICD-10-CM | POA: Diagnosis not present

## 2020-10-16 DIAGNOSIS — M79675 Pain in left toe(s): Secondary | ICD-10-CM | POA: Diagnosis not present

## 2020-10-16 DIAGNOSIS — B351 Tinea unguium: Secondary | ICD-10-CM | POA: Diagnosis not present

## 2020-10-19 ENCOUNTER — Encounter: Payer: Self-pay | Admitting: Internal Medicine

## 2020-10-19 ENCOUNTER — Other Ambulatory Visit: Payer: Self-pay

## 2020-10-19 ENCOUNTER — Ambulatory Visit (INDEPENDENT_AMBULATORY_CARE_PROVIDER_SITE_OTHER): Payer: Medicare Other | Admitting: Internal Medicine

## 2020-10-19 VITALS — BP 120/82 | HR 83 | Ht 64.0 in | Wt 227.4 lb

## 2020-10-19 DIAGNOSIS — E039 Hypothyroidism, unspecified: Secondary | ICD-10-CM | POA: Diagnosis not present

## 2020-10-19 DIAGNOSIS — E213 Hyperparathyroidism, unspecified: Secondary | ICD-10-CM

## 2020-10-19 DIAGNOSIS — E559 Vitamin D deficiency, unspecified: Secondary | ICD-10-CM

## 2020-10-19 LAB — T4, FREE: Free T4: 1.21 ng/dL (ref 0.60–1.60)

## 2020-10-19 LAB — TSH: TSH: 0.88 u[IU]/mL (ref 0.35–4.50)

## 2020-10-19 NOTE — Progress Notes (Signed)
  Subjective:  Patient ID: Alicia Vasquez, female    DOB: [birth year],  MRN: [id number]  Alicia Vasquez presents to clinic today for painful thick toenails that are difficult to trim. Pain interferes with ambulation. Aggravating factors include wearing enclosed shoe gear. Pain is relieved with periodic professional debridement.  Allergies  Allergen Reactions  . Anesthetics, Amide Hypertension  . Benadryl [Diphenhydramine Hcl] Other (See Comments)    Dizziness  . Carbocaine [Mepivacaine Hcl] Hypertension  . Codeine Other (See Comments)    Dizziness  . Epinephrine Hypertension  . Sulfa Antibiotics Other (See Comments)    dizziness  . Diphenhydramine   . Latex Other (See Comments)    Blisters in mouth  . Tramadol     Sedation.   Marland Kitchen Penicillins Rash    Has patient had a PCN reaction causing immediate rash, facial/tongue/throat swelling, SOB or lightheadedness with hypotension: Unknown Has patient had a PCN reaction causing severe rash involving mucus membranes or skin necrosis: Unknown Has patient had a PCN reaction that required hospitalization: Unknown Has patient had a PCN reaction occurring within the last 10 years: Unknown If all of the above answers are "NO", then may proceed with Cephalosporin use.     Review of Systems: Negative except as noted in the HPI. Objective:   Constitutional Alicia Vasquez is a pleasant 80 y.o. Caucasian female, in NAD. AAO x 3.   Vascular Capillary fill time to digits <3 seconds b/l lower extremities. Faintly palpable pedal pulses b/l. Pedal hair sparse. Lower extremity skin temperature gradient within normal limits. No pain with calf compression b/l. No cyanosis or clubbing noted.  Neurologic Normal speech. Oriented to person, place, and time. Protective sensation diminished with 10g monofilament b/l. Vibratory sensation intact b/l.  Dermatologic Pedal skin with normal turgor, texture and tone bilaterally. No open wounds bilaterally. No  interdigital macerations bilaterally. Toenails 1-5 b/l elongated, discolored, dystrophic, thickened, crumbly with subungual debris and tenderness to dorsal palpation.  Orthopedic: Normal muscle strength 5/5 to all lower extremity muscle groups bilaterally. No pain crepitus or joint limitation noted with ROM b/l. No gross bony deformities bilaterally.   Radiographs: None Assessment:   1. Pain due to onychomycosis of toenails of both feet    Plan:  Patient was evaluated and treated and all questions answered.  Onychomycosis with pain -Nails palliatively debridement as below -Educated on self-care  Procedure: Nail Debridement Rationale: Pain Type of Debridement: manual, sharp debridement. Instrumentation: Nail nipper, rotary burr. Number of Nails: 10 -Examined patient. -Patient to continue soft, supportive shoe gear daily. -Toenails 1-5 b/l were debrided in length and girth with sterile nail nippers and dremel without iatrogenic bleeding.  -Patient to report any pedal injuries to medical professional immediately. -Patient/POA to call should there be question/concern in the interim.  Return in about 3 months (around 01/16/2021).  Marzetta Board, DPM

## 2020-10-19 NOTE — Progress Notes (Signed)
Patient ID: Alicia Vasquez, female   DOB: 14-Jul-1940, 80 y.o.   MRN: [id number]  This visit occurred during the SARS-CoV-2 public health emergency.  Safety protocols were in place, including screening questions prior to the visit, additional usage of staff PPE, and extensive cleaning of exam room while observing appropriate contact time as indicated for disinfecting solutions.   HPI  Alicia Vasquez is a 80 y.o.-year-old female, returning for follow-up for hyperparathyroidism and severe vitamin D deficiency, hypothyroidism. Last visit 1 year and 4 months ago (virtual). She is here with a friend who helps [name] converse since patient is very hypoacusic.  Interim history: She had a large L leg hematoma and R leg cellulitis. She is back on Xgeva. No falls or fractures since last visit.  Reviewed history: Pt was dx with found to have an elevated parathyroid hormone in 02/2014.   She also had high calcium levels >> high at 11.5.  Per her preference, we are following her clinically and biochemically.  I reviewed pertinent labs: Lab Results  Component Value Date   PTH 46 05/05/2017   PTH 26 12/06/2016   PTH Comment 12/06/2016   PTH 31 12/07/2015   PTH Comment 12/07/2015   PTH 37 06/09/2015   PTH Comment 06/09/2015   PTH 37 12/19/2014   PTH Comment 12/19/2014   PTH 88 (H) 07/27/2014   CALCIUM 10.8 (H) 10/01/2020   CALCIUM 11.0 (H) 09/03/2020   CALCIUM 10.7 (H) 07/27/2020   CALCIUM 11.0 (H) 07/13/2020   CALCIUM 10.7 (H) 07/09/2020   CALCIUM 10.9 (H) 08/07/2019   CALCIUM 10.6 (H) 07/10/2019   CALCIUM 10.3 06/12/2019   CALCIUM 11.1 (H) 05/15/2019   CALCIUM 10.6 (H) 04/17/2019   Vitamin D deficiency-PTH level normalized after normalization of her vitamin D levels, however, calcium levels remain elevated.  Pt was taking 1000 IU vitamin D in the past - a level was very low then >> we increased her vitamin D supplement to 5000 units daily >> vitamin D normalized.  She takes this  dose now.  Latest vitamin D level was normal: Lab Results  Component Value Date   VD25OH 49 06/21/2019   VD25OH 59.91 06/14/2018   VD25OH 63 05/05/2017   VD25OH 45.98 12/06/2016   VD25OH 47.68 12/07/2015   VD25OH 45.83 06/09/2015   VD25OH 30.58 12/19/2014   VD25OH 26.82 (L) 10/17/2014   VD25OH 7.19 (L) 04/18/2014   Magnesium, phosphorus, calcitriol levels were normal: Component     Latest Ref Rng 04/18/2014         3:04 PM  Vitamin D 1, 25 (OH) Total     18 - 72 pg/mL 55  Vitamin D3 1, 25 (OH)      55  Vitamin D2 1, 25 (OH)      <8  Magnesium     1.5 - 2.5 mg/dL 2.1  Phosphorus     2.3 - 4.6 mg/dL 2.8   She did not collect a 24h urine.  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  However, she is now back on this.   No history of kidney stones.  No CKD. Last BUN/Cr: Lab Results  Component  Value Date   BUN 13 10/01/2020   CREATININE 0.82 10/01/2020   She also has a history of breast cancer and is on Arimidex. She had RxTx. No ChTx.  Hypothyroidism:  Pt is on levothyroxine 125 mcg daily, taken: - in am - fasting - at least 30 min from b'fast - no Ca, Fe, MVI, PPIs - not on Biotin  Last TSH normal: Lab Results  Component Value Date   TSH 2.97 06/21/2019   Pt denies: - feeling nodules in neck - dysphagia - SOB with lying down But has hoarseness, and occasional chocking which is chronic  ROS: Constitutional: no weight gain/no weight loss, + fatigue, no subjective hyperthermia, no subjective hypothermia Eyes: no blurry vision, no xerophthalmia ENT: no sore throat, + see HPI Cardiovascular: no CP/no SOB/no  palpitations/+ leg swelling Respiratory: no cough/no SOB/no wheezing Gastrointestinal: no N/no V/no D/no C/no acid reflux Musculoskeletal: + muscle aches/+ joint aches (back pain) Skin: + rash - legs no hair loss Neurological: no tremors/+ numbnesss/no tingling/no dizziness  I reviewed pt's medications, allergies, PMH, social hx, family hx, and changes were documented in the history of present illness. Otherwise, unchanged from my initial visit note.   Past Medical History:  Diagnosis Date  . Allergy   . Anxiety   . Arthritis   . Blood transfusion without reported diagnosis   . Breast cancer ([place]) 07/12/2013   Invasive Mammary Carcinoma  . DVT (deep vein thrombosis) in pregnancy   . Hypertension   . Hypothyroid   . Metastatic cancer to bone ([facility]) dx'd 12/2016   hip  . Personal history of radiation therapy   . Pneumonia   . Radiation 11/21/13-01/07/14   Right Breast/Supraclavicular   Past Surgical History:  Procedure Laterality Date  . BREAST LUMPECTOMY Left 2015  . BREAST LUMPECTOMY WITH RADIOACTIVE SEED LOCALIZATION Right 09/09/2013   Procedure: BREAST LUMPECTOMY WITH RADIOACTIVE SEED LOCALIZATION WITH AXILLARY NODE EXCISION;  Surgeon: [name], MD;  Location: [facility];  Service: General;  Laterality: Right;  . DENTAL SURGERY  04/19/2012   13 TEETH REMOVED  . DILATION AND CURETTAGE OF UTERUS    . ORIF PERIPROSTHETIC FRACTURE Left 01/31/2017   Procedure: REVISION and OPEN REDUCTION INTERNAL FIXATION (ORIF) PERIPROSTHETIC FRACTURE LEFT HIP;  Surgeon: Paralee Cancel, MD;  Location: [facility];  Service: Orthopedics;  Laterality: Left;  120 mins  . RE-EXCISION OF BREAST LUMPECTOMY Right 09/24/2013   Procedure: RE-EXCISION OF RIGHT BREAST LUMPECTOMY;  Surgeon: [name], MD;  Location: [facility];  Service: General;  Laterality: Right;  . TOTAL HIP ARTHROPLASTY Left 01/16/2017   Procedure: TOTAL HIP ARTHROPLASTY POSTERIOR;  Surgeon: Paralee Cancel, MD;  Location: [facility];  Service: Orthopedics;  Laterality: Left;   History   Social History  . Marital Status: Widowed    Spouse Name: N/A    Number of Children: 1   Occupational History  . Retired Radio broadcast assistant   Social History Main Topics  . Smoking status: Never Smoker   . Smokeless tobacco: Not on file  . Alcohol Use: No  . Drug Use: No   Social History Narrative   Exercise yard work   Current Outpatient Medications on File Prior to Visit  Medication Sig Dispense Refill  . B-D TB SYRINGE 1CC/27GX1/2" 27G X 1/2" 1 ML MISC USE TO DRAW UP 1 ML LIDOCAINE AND INJECT INTO CEFTRIAXONE VIAL FOR DILUTION. CHANGE TO 25G NEEDLE FOR ADMINISTRATION    . Cholecalciferol (VITAMIN D3) 5000 UNITS TABS Take  5,000 Units by mouth daily.     . clotrimazole-betamethasone (LOTRISONE) lotion Apply topically 2 (two) times daily. 60 mL 0  . HYDROcodone-acetaminophen (NORCO/VICODIN) 5-325 MG tablet Take 1 tablet by mouth every 6 (six) hours as needed for moderate pain. 120 tablet 0  . ketoconazole (NIZORAL) 2 % cream Apply 1 application topically daily. 15 g 0  . lidocaine (XYLOCAINE) 1 % (with preservative) injection     . NEEDLE, DISP, 25 G 25G X 1-1/2" MISC Please use to administer rocephin injection 15 each 0  . SYNTHROID 125 MCG tablet TAKE 1 TABLET BY MOUTH EVERY DAY BEFORE BREAKFAST 90 tablet 4  . Syringe, Disposable, 5 ML MISC Please use to inject rocephin injection 5 each 0  . vitamin C (ASCORBIC ACID) 250 MG tablet Take 250 mg by mouth 3 (three) times daily.    Marland Kitchen warfarin (COUMADIN) 5 MG tablet Take 7.5 mg daily. 270 tablet 1   No current facility-administered medications on file prior to visit.   Allergies  Allergen Reactions  . Anesthetics, Amide Hypertension  . Benadryl [Diphenhydramine Hcl] Other (See Comments)    Dizziness  . Carbocaine [Mepivacaine Hcl] Hypertension  . Codeine Other (See Comments)    Dizziness  . Epinephrine Hypertension  . Sulfa Antibiotics Other (See  Comments)    dizziness  . Diphenhydramine   . Latex Other (See Comments)    Blisters in mouth  . Tramadol     Sedation.   Marland Kitchen Penicillins Rash    Has patient had a PCN reaction causing immediate rash, facial/tongue/throat swelling, SOB or lightheadedness with hypotension: Unknown Has patient had a PCN reaction causing severe rash involving mucus membranes or skin necrosis: Unknown Has patient had a PCN reaction that required hospitalization: Unknown Has patient had a PCN reaction occurring within the last 10 years: Unknown If all of the above answers are "NO", then may proceed with Cephalosporin use.    Family History  Problem Relation Age of Onset  . Heart disease Brother   . Colon cancer Brother   . Prostate cancer Brother    PE: BP 120/82 (BP Location: Left Arm, Patient Position: Sitting, Cuff Size: Normal)   Pulse 83   Ht 5\' 4"  (1.626 m)   Wt 227 lb 6.4 oz (103.1 kg)   SpO2 95%   BMI 39.03 kg/m   Body mass index is 39.03 kg/m. Wt Readings from Last 3 Encounters:  10/19/20 227 lb 6.4 oz (103.1 kg)  10/01/20 227 lb 4.8 oz (103.1 kg)  07/27/20 232 lb 8 oz (105.5 kg)   Constitutional: overweight, in NAD Eyes: PERRLA, EOMI, no exophthalmos ENT: moist mucous membranes, no thyromegaly, no cervical lymphadenopathy Cardiovascular: RRR, No MRG Respiratory: CTA B Gastrointestinal: abdomen soft, NT, ND, BS+ Musculoskeletal: no deformities, strength intact in all 4 Skin: moist, warm, no rashes Neurological: no tremor with outstretched hands, DTR normal in all 4  Assessment: 1. Normocalcemic hyperparathyroidism  2. H/o Vitamin D deficiency  3. Hypothyroidism  Plan: 1. Hyperparathyroidism  -Patient with an interesting history of mostly normal calcium levels in the past, with elevated PTH and low vitamin D.  Her PTH levels normalized after normalizing her vitamin D level, however, calcium started to increase and remain elevated afterwards.  Her calcitriol, magnesium,  phosphorus, were all normal.  She continues on vitamin D supplementation.  Latest calcium was slightly high, at 10.8 on 10/01/2020. -She has no history of nephrolithiasis and no clear history of osteoporosis based on DXA scan reports (of note,  33% radius BMD was normal and higher than the rest of the scores on her DEXA scan from 08/2017), but she does have a history of pelvic fracture, which qualifies her for clinical osteoporosis -She was on Xgeva for breast cancer with bone metastasis but she came off before last visit -Per her preference, we are following her clinically and biochemically - I will see her back in a year  2. H/o Severe vitamin D deficiency -Vitamin D level from last visit was normal, at 49  -continue 5000 units daily -We will recheck vitamin D level now.  3. Hypothyroidism - latest thyroid labs reviewed with pt >> normal: Lab Results  Component Value Date   TSH 2.97 06/21/2019   - she continues on LT4 125 mcg daily - pt feels good on this dose. - we discussed about taking the thyroid hormone every day, with water, >30 minutes before breakfast, separated by >4 hours from acid reflux medications, calcium, iron, multivitamins. Pt. is taking it correctly. - will check thyroid tests today: TSH and fT4 - If labs are abnormal, she will need to return for repeat TFTs in 1.5 months . Component     Latest Ref Rng & Units 10/19/2020  T4,Free(Direct)     0.60 - 1.60 ng/dL 1.21  TSH     0.35 - 4.50 uIU/mL 0.88  Vitamin D, 25-Hydroxy     30.0 - 100.0 ng/mL 47.9  Normal TFTs and vitamin D.  Philemon Kingdom, MD PhD [facility]

## 2020-10-19 NOTE — Patient Instructions (Addendum)
Please continue vitamin D 5000 units daily.  Please continue Levothyroxine 125 mcg daily.  Take the thyroid hormone every day, with water, at least 30 minutes before breakfast, separated by at least 4 hours from: - acid reflux medications - calcium - iron - multivitamins  Please stop at the lab.  Please come back for a follow-up appointment in 1 year.

## 2020-10-20 LAB — VITAMIN D 25 HYDROXY (VIT D DEFICIENCY, FRACTURES): Vit D, 25-Hydroxy: 47.9 ng/mL (ref 30.0–100.0)

## 2020-10-29 ENCOUNTER — Other Ambulatory Visit: Payer: Self-pay

## 2020-10-29 ENCOUNTER — Inpatient Hospital Stay: Payer: Medicare Other | Attending: Oncology

## 2020-10-29 ENCOUNTER — Inpatient Hospital Stay: Payer: Medicare Other

## 2020-10-29 VITALS — BP 152/65 | HR 76 | Temp 98.4°F | Resp 20

## 2020-10-29 DIAGNOSIS — Z5111 Encounter for antineoplastic chemotherapy: Secondary | ICD-10-CM | POA: Insufficient documentation

## 2020-10-29 DIAGNOSIS — Z170 Estrogen receptor positive status [ER+]: Secondary | ICD-10-CM | POA: Diagnosis not present

## 2020-10-29 DIAGNOSIS — Z79899 Other long term (current) drug therapy: Secondary | ICD-10-CM | POA: Diagnosis not present

## 2020-10-29 DIAGNOSIS — Z7901 Long term (current) use of anticoagulants: Secondary | ICD-10-CM | POA: Insufficient documentation

## 2020-10-29 DIAGNOSIS — C50411 Malignant neoplasm of upper-outer quadrant of right female breast: Secondary | ICD-10-CM

## 2020-10-29 DIAGNOSIS — I82401 Acute embolism and thrombosis of unspecified deep veins of right lower extremity: Secondary | ICD-10-CM

## 2020-10-29 DIAGNOSIS — I10 Essential (primary) hypertension: Secondary | ICD-10-CM | POA: Diagnosis not present

## 2020-10-29 DIAGNOSIS — M84659P Pathological fracture in other disease, hip, unspecified, subsequent encounter for fracture with malunion: Secondary | ICD-10-CM

## 2020-10-29 DIAGNOSIS — M899 Disorder of bone, unspecified: Secondary | ICD-10-CM

## 2020-10-29 DIAGNOSIS — E039 Hypothyroidism, unspecified: Secondary | ICD-10-CM | POA: Diagnosis not present

## 2020-10-29 DIAGNOSIS — Z86718 Personal history of other venous thrombosis and embolism: Secondary | ICD-10-CM | POA: Insufficient documentation

## 2020-10-29 DIAGNOSIS — C7951 Secondary malignant neoplasm of bone: Secondary | ICD-10-CM | POA: Insufficient documentation

## 2020-10-29 LAB — CMP (CANCER CENTER ONLY)
ALT: 10 U/L (ref 0–44)
AST: 11 U/L — ABNORMAL LOW (ref 15–41)
Albumin: 3.6 g/dL (ref 3.5–5.0)
Alkaline Phosphatase: 104 U/L (ref 38–126)
Anion gap: 10 (ref 5–15)
BUN: 16 mg/dL (ref 8–23)
CO2: 26 mmol/L (ref 22–32)
Calcium: 10.9 mg/dL — ABNORMAL HIGH (ref 8.9–10.3)
Chloride: 105 mmol/L (ref 98–111)
Creatinine: 0.81 mg/dL (ref 0.44–1.00)
GFR, Estimated: >60 mL/min (ref >60)
Glucose, Bld: 106 mg/dL — ABNORMAL HIGH (ref 70–99)
Potassium: 4 mmol/L (ref 3.5–5.1)
Sodium: 141 mmol/L (ref 135–145)
Total Bilirubin: 0.3 mg/dL (ref 0.3–1.2)
Total Protein: 6.9 g/dL (ref 6.5–8.1)

## 2020-10-29 LAB — CBC WITH DIFFERENTIAL (CANCER CENTER ONLY)
Abs Immature Granulocytes: 0.03 10*3/uL (ref 0.00–0.07)
Basophils Absolute: 0.1 10*3/uL (ref 0.0–0.1)
Basophils Relative: 1 %
Eosinophils Absolute: 0.4 10*3/uL (ref 0.0–0.5)
Eosinophils Relative: 5 %
HCT: 40.1 % (ref 36.0–46.0)
Hemoglobin: 12.7 g/dL (ref 12.0–15.0)
Immature Granulocytes: 0 %
Lymphocytes Relative: 14 %
Lymphs Abs: 1 10*3/uL (ref 0.7–4.0)
MCH: 28 pg (ref 26.0–34.0)
MCHC: 31.7 g/dL (ref 30.0–36.0)
MCV: 88.5 fL (ref 80.0–100.0)
Monocytes Absolute: 0.9 10*3/uL (ref 0.1–1.0)
Monocytes Relative: 12 %
Neutro Abs: 5.1 10*3/uL (ref 1.7–7.7)
Neutrophils Relative %: 68 %
Platelet Count: 236 10*3/uL (ref 150–400)
RBC: 4.53 MIL/uL (ref 3.87–5.11)
RDW: 15.6 % — ABNORMAL HIGH (ref 11.5–15.5)
WBC Count: 7.5 10*3/uL (ref 4.0–10.5)
nRBC: 0 % (ref 0.0–0.2)

## 2020-10-29 LAB — PROTIME-INR
INR: 2 — ABNORMAL HIGH (ref 0.8–1.2)
Prothrombin Time: 22.7 seconds — ABNORMAL HIGH (ref 11.4–15.2)

## 2020-10-29 MED ORDER — FULVESTRANT 250 MG/5ML IM SOLN
INTRAMUSCULAR | Status: AC
  Filled 2020-10-29: qty 10

## 2020-10-29 MED ORDER — FULVESTRANT 250 MG/5ML IM SOLN
500.0000 mg | Freq: Once | INTRAMUSCULAR | Status: AC
Start: 2020-10-29 — End: 2020-10-29
  Administered 2020-10-29: 500 mg via INTRAMUSCULAR

## 2020-10-29 NOTE — Patient Instructions (Signed)
Fulvestrant injection What is this medicine? FULVESTRANT (ful VES trant) blocks the effects of estrogen. It is used to treat breast cancer. This medicine may be used for other purposes; ask your health care provider or pharmacist if you have questions. COMMON BRAND NAME(S): FASLODEX What should I tell my health care provider before I take this medicine? They need to know if you have any of these conditions:  bleeding disorders  liver disease  low blood counts, like low white cell, platelet, or red cell counts  an unusual or allergic reaction to fulvestrant, other medicines, foods, dyes, or preservatives  pregnant or trying to get pregnant  breast-feeding How should I use this medicine? This medicine is for injection into a muscle. It is usually given by a health care professional in a hospital or clinic setting. Talk to your pediatrician regarding the use of this medicine in children. Special care may be needed. Overdosage: If you think you have taken too much of this medicine contact a poison control center or emergency room at once. NOTE: This medicine is only for you. Do not share this medicine with others. What if I miss a dose? It is important not to miss your dose. Call your doctor or health care professional if you are unable to keep an appointment. What may interact with this medicine?  medicines that treat or prevent blood clots like warfarin, enoxaparin, dalteparin, apixaban, dabigatran, and rivaroxaban This list may not describe all possible interactions. Give your health care provider a list of all the medicines, herbs, non-prescription drugs, or dietary supplements you use. Also tell them if you smoke, drink alcohol, or use illegal drugs. Some items may interact with your medicine. What should I watch for while using this medicine? Your condition will be monitored carefully while you are receiving this medicine. You will need important blood work done while you are taking  this medicine. Do not become pregnant while taking this medicine or for at least 1 year after stopping it. Women of child-bearing potential will need to have a negative pregnancy test before starting this medicine. Women should inform their doctor if they wish to become pregnant or think they might be pregnant. There is a potential for serious side effects to an unborn child. Men should inform their doctors if they wish to father a child. This medicine may lower sperm counts. Talk to your health care professional or pharmacist for more information. Do not breast-feed an infant while taking this medicine or for 1 year after the last dose. What side effects may I notice from receiving this medicine? Side effects that you should report to your doctor or health care professional as soon as possible:  allergic reactions like skin rash, itching or hives, swelling of the face, lips, or tongue  feeling faint or lightheaded, falls  pain, tingling, numbness, or weakness in the legs  signs and symptoms of infection like fever or chills; cough; flu-like symptoms; sore throat  vaginal bleeding Side effects that usually do not require medical attention (report to your doctor or health care professional if they continue or are bothersome):  aches, pains  constipation  diarrhea  headache  hot flashes  nausea, vomiting  pain at site where injected  stomach pain This list may not describe all possible side effects. Call your doctor for medical advice about side effects. You may report side effects to FDA at 1-800-FDA-1088. Where should I keep my medicine? This drug is given in a hospital or clinic and will   not be stored at home. NOTE: This sheet is a summary. It may not cover all possible information. If you have questions about this medicine, talk to your doctor, pharmacist, or health care provider.  2021 Elsevier/Gold Standard (2017-08-24 11:34:41)  

## 2020-10-30 LAB — CANCER ANTIGEN 27.29: CA 27.29: 42.7 U/mL — ABNORMAL HIGH (ref 0.0–38.6)

## 2020-11-16 ENCOUNTER — Telehealth: Payer: Self-pay

## 2020-11-16 NOTE — Telephone Encounter (Signed)
Pt called asking for refill on Norco 5-325. Request given to Dr Jana Hakim.

## 2020-11-17 ENCOUNTER — Other Ambulatory Visit: Payer: Self-pay | Admitting: Oncology

## 2020-11-17 DIAGNOSIS — M84559P Pathological fracture in neoplastic disease, hip, unspecified, subsequent encounter for fracture with malunion: Secondary | ICD-10-CM

## 2020-11-17 MED ORDER — HYDROCODONE-ACETAMINOPHEN 5-325 MG PO TABS: 1.0000 | ORAL_TABLET | Freq: Four times a day (QID) | ORAL | 0 refills | Status: DC | PRN

## 2020-11-26 ENCOUNTER — Other Ambulatory Visit: Payer: Self-pay

## 2020-11-26 ENCOUNTER — Inpatient Hospital Stay: Payer: Medicare Other

## 2020-11-26 VITALS — BP 173/77 | HR 79 | Temp 98.6°F | Resp 16

## 2020-11-26 DIAGNOSIS — M899 Disorder of bone, unspecified: Secondary | ICD-10-CM

## 2020-11-26 DIAGNOSIS — C50411 Malignant neoplasm of upper-outer quadrant of right female breast: Secondary | ICD-10-CM

## 2020-11-26 DIAGNOSIS — C7951 Secondary malignant neoplasm of bone: Secondary | ICD-10-CM

## 2020-11-26 DIAGNOSIS — Z86718 Personal history of other venous thrombosis and embolism: Secondary | ICD-10-CM | POA: Diagnosis not present

## 2020-11-26 DIAGNOSIS — Z170 Estrogen receptor positive status [ER+]: Secondary | ICD-10-CM | POA: Diagnosis not present

## 2020-11-26 DIAGNOSIS — Z5111 Encounter for antineoplastic chemotherapy: Secondary | ICD-10-CM | POA: Diagnosis not present

## 2020-11-26 DIAGNOSIS — I82401 Acute embolism and thrombosis of unspecified deep veins of right lower extremity: Secondary | ICD-10-CM

## 2020-11-26 DIAGNOSIS — I10 Essential (primary) hypertension: Secondary | ICD-10-CM | POA: Diagnosis not present

## 2020-11-26 DIAGNOSIS — M84659P Pathological fracture in other disease, hip, unspecified, subsequent encounter for fracture with malunion: Secondary | ICD-10-CM

## 2020-11-26 LAB — CBC WITH DIFFERENTIAL (CANCER CENTER ONLY)
Abs Immature Granulocytes: 0.03 10*3/uL (ref 0.00–0.07)
Basophils Absolute: 0.1 10*3/uL (ref 0.0–0.1)
Basophils Relative: 1 %
Eosinophils Absolute: 0.3 10*3/uL (ref 0.0–0.5)
Eosinophils Relative: 3 %
HCT: 40.8 % (ref 36.0–46.0)
Hemoglobin: 13 g/dL (ref 12.0–15.0)
Immature Granulocytes: 0 %
Lymphocytes Relative: 16 %
Lymphs Abs: 1.2 10*3/uL (ref 0.7–4.0)
MCH: 28 pg (ref 26.0–34.0)
MCHC: 31.9 g/dL (ref 30.0–36.0)
MCV: 87.7 fL (ref 80.0–100.0)
Monocytes Absolute: 1.2 10*3/uL — ABNORMAL HIGH (ref 0.1–1.0)
Monocytes Relative: 16 %
Neutro Abs: 5.2 10*3/uL (ref 1.7–7.7)
Neutrophils Relative %: 64 %
Platelet Count: 258 10*3/uL (ref 150–400)
RBC: 4.65 MIL/uL (ref 3.87–5.11)
RDW: 15.9 % — ABNORMAL HIGH (ref 11.5–15.5)
WBC Count: 8 10*3/uL (ref 4.0–10.5)
nRBC: 0 % (ref 0.0–0.2)

## 2020-11-26 LAB — CMP (CANCER CENTER ONLY)
ALT: 11 U/L (ref 0–44)
AST: 13 U/L — ABNORMAL LOW (ref 15–41)
Albumin: 3.6 g/dL (ref 3.5–5.0)
Alkaline Phosphatase: 104 U/L (ref 38–126)
Anion gap: 7 (ref 5–15)
BUN: 17 mg/dL (ref 8–23)
CO2: 25 mmol/L (ref 22–32)
Calcium: 10.9 mg/dL — ABNORMAL HIGH (ref 8.9–10.3)
Chloride: 107 mmol/L (ref 98–111)
Creatinine: 0.78 mg/dL (ref 0.44–1.00)
GFR, Estimated: >60 mL/min (ref >60)
Glucose, Bld: 91 mg/dL (ref 70–99)
Potassium: 4.3 mmol/L (ref 3.5–5.1)
Sodium: 139 mmol/L (ref 135–145)
Total Bilirubin: 0.4 mg/dL (ref 0.3–1.2)
Total Protein: 7 g/dL (ref 6.5–8.1)

## 2020-11-26 LAB — PROTIME-INR
INR: 1.2 (ref 0.8–1.2)
Prothrombin Time: 15.4 seconds — ABNORMAL HIGH (ref 11.4–15.2)

## 2020-11-26 MED ORDER — FULVESTRANT 250 MG/5ML IM SOLN
INTRAMUSCULAR | Status: AC
  Filled 2020-11-26: qty 10

## 2020-11-26 MED ORDER — FULVESTRANT 250 MG/5ML IM SOLN
500.0000 mg | Freq: Once | INTRAMUSCULAR | Status: AC
Start: 2020-11-26 — End: 2020-11-26
  Administered 2020-11-26: 500 mg via INTRAMUSCULAR

## 2020-11-26 NOTE — Patient Instructions (Signed)
Fulvestrant injection What is this medication? FULVESTRANT (ful VES trant) blocks the effects of estrogen. It is used to treat breast cancer. This medicine may be used for other purposes; ask your health care provider or pharmacist if you have questions. COMMON BRAND NAME(S): FASLODEX What should I tell my care team before I take this medication? They need to know if you have any of these conditions: bleeding disorders liver disease low blood counts, like low white cell, platelet, or red cell counts an unusual or allergic reaction to fulvestrant, other medicines, foods, dyes, or preservatives pregnant or trying to get pregnant breast-feeding How should I use this medication? This medicine is for injection into a muscle. It is usually given by a health care professional in a hospital or clinic setting. Talk to your pediatrician regarding the use of this medicine in children. Special care may be needed. Overdosage: If you think you have taken too much of this medicine contact a poison control center or emergency room at once. NOTE: This medicine is only for you. Do not share this medicine with others. What if I miss a dose? It is important not to miss your dose. Call your doctor or health care professional if you are unable to keep an appointment. What may interact with this medication? medicines that treat or prevent blood clots like warfarin, enoxaparin, dalteparin, apixaban, dabigatran, and rivaroxaban This list may not describe all possible interactions. Give your health care provider a list of all the medicines, herbs, non-prescription drugs, or dietary supplements you use. Also tell them if you smoke, drink alcohol, or use illegal drugs. Some items may interact with your medicine. What should I watch for while using this medication? Your condition will be monitored carefully while you are receiving this medicine. You will need important blood work done while you are taking this  medicine. Do not become pregnant while taking this medicine or for at least 1 year after stopping it. Women of child-bearing potential will need to have a negative pregnancy test before starting this medicine. Women should inform their doctor if they wish to become pregnant or think they might be pregnant. There is a potential for serious side effects to an unborn child. Men should inform their doctors if they wish to father a child. This medicine may lower sperm counts. Talk to your health care professional or pharmacist for more information. Do not breast-feed an infant while taking this medicine or for 1 year after the last dose. What side effects may I notice from receiving this medication? Side effects that you should report to your doctor or health care professional as soon as possible: allergic reactions like skin rash, itching or hives, swelling of the face, lips, or tongue feeling faint or lightheaded, falls pain, tingling, numbness, or weakness in the legs signs and symptoms of infection like fever or chills; cough; flu-like symptoms; sore throat vaginal bleeding Side effects that usually do not require medical attention (report to your doctor or health care professional if they continue or are bothersome): aches, pains constipation diarrhea headache hot flashes nausea, vomiting pain at site where injected stomach pain This list may not describe all possible side effects. Call your doctor for medical advice about side effects. You may report side effects to FDA at 1-800-FDA-1088. Where should I keep my medication? This drug is given in a hospital or clinic and will not be stored at home. NOTE: This sheet is a summary. It may not cover all possible information. If you have   questions about this medicine, talk to your doctor, pharmacist, or health care provider.  2022 Elsevier/Gold Standard (2017-08-24 11:34:41)  

## 2020-11-27 ENCOUNTER — Telehealth: Payer: Self-pay

## 2020-11-27 LAB — CANCER ANTIGEN 27.29: CA 27.29: 44.1 U/mL — ABNORMAL HIGH (ref 0.0–38.6)

## 2020-11-27 NOTE — Telephone Encounter (Signed)
Pt returned call from previous phone note and LVM. This LPN attempted to call pt on both lines, no answer. LVM

## 2020-12-02 ENCOUNTER — Telehealth: Payer: Self-pay | Admitting: *Deleted

## 2020-12-02 NOTE — Telephone Encounter (Signed)
This RN called pt again per need to follow up on  current coumadin dose use INR of 1.2 for proper dosing instructions.  This RN obtained identified VM- detailed message left stating need for return call to this RN with what she is currently taking/coumadin dose as well as need to know if any missed doses for noted low INR.  This RN's name given for return call with request to leave above information on VM if she receives it and is unable to speak to this RN.

## 2020-12-03 ENCOUNTER — Telehealth: Payer: Self-pay | Admitting: *Deleted

## 2020-12-03 NOTE — Telephone Encounter (Signed)
This RN spoke with pt post multiple calls and VM between this nurse and patient.  Vendela states she is supposed to be taking 7.5 mg coumadin daily but she did miss a few doses prior to lab done 6/30 with INR of 1.2.  Per MD review- recommendation is to continue with current dose.  This RN informed pt as well as reiterated need for daily dosing for best outcome.  Pt verbalized understanding.

## 2020-12-16 ENCOUNTER — Encounter: Payer: Self-pay | Admitting: Physician Assistant

## 2020-12-16 ENCOUNTER — Other Ambulatory Visit: Payer: Self-pay

## 2020-12-16 ENCOUNTER — Telehealth: Payer: Self-pay

## 2020-12-16 ENCOUNTER — Ambulatory Visit (INDEPENDENT_AMBULATORY_CARE_PROVIDER_SITE_OTHER): Payer: Medicare Other | Admitting: Physician Assistant

## 2020-12-16 VITALS — BP 151/87 | HR 82 | Temp 98.2°F | Ht 64.0 in

## 2020-12-16 DIAGNOSIS — R350 Frequency of micturition: Secondary | ICD-10-CM | POA: Diagnosis not present

## 2020-12-16 LAB — POC URINALSYSI DIPSTICK (AUTOMATED)
Bilirubin, UA: NEGATIVE
Glucose, UA: NEGATIVE
Ketones, UA: NEGATIVE
Nitrite, UA: NEGATIVE
Protein, UA: POSITIVE — AB
Spec Grav, UA: 1.015 (ref 1.010–1.025)
Urobilinogen, UA: NEGATIVE E.U./dL — AB
pH, UA: 7 (ref 5.0–8.0)

## 2020-12-16 NOTE — Telephone Encounter (Signed)
Patient Name: Alicia Vasquez [facility] Gender: Female DOB: 1940-08-23 Age: 80 Y 59 M 4 D Return Phone Number: [phone] (Primary) Address: City/ State/ Zip: [place]  [address] Client [place] [facility] [place] [facility] [place] [facility] Dimas Chyle- MD Contact Type Call Who Is Calling Patient / Member / Family / Caregiver Call Type Triage / Clinical Relationship To Patient Self Return Phone Number [phone] (Primary) Chief Complaint Urination Frequency Reason for Call Symptomatic / Request for Jacona states she may have a UTI. She is experiencing frequent urination. This call came from the office and they have no appointments this week. Translation No Nurse Assessment Nurse: Terence Lux, RN, Christine Date/Time (Eastern Time): 12/15/2020 5:20:34 PM Confirm and document reason for call. If symptomatic, describe symptoms. ---Caller states she may have a UTI. She is experiencing frequent urination. This call came from the office and they have no appointments this week.Started over weekend. Pain in back. Pt states temp is 97.4 currently. Over weekend temp was 99.0 Does the patient have any new or worsening symptoms? ---Yes Will a triage be completed? ---Yes Related visit to physician within the last 2 weeks? ---No Does the PT have any chronic conditions? (i.e. diabetes, asthma, this includes High risk factors for pregnancy, etc.) ---Yes List chronic conditions. ---cancer, thyroid, pain meds-hydrocodone Is this a behavioral health or substance abuse call? ---No Guidelines Guideline Title Affirmed Question Affirmed Notes Nurse Date/Time (Eastern Time) Urinary Symptoms Side (flank) or lower back pain present Terence Lux, RN, Altha Harm 12/15/2020 5:23:40 PM Disp. Time Eilene Ghazi Time) Disposition Final User PLEASE NOTE: All timestamps contained within this report are  represented as [place] Standard Time. CONFIDENTIALTY NOTICE: This fax transmission is intended only for the addressee. It contains information that is legally privileged, confidential or otherwise protected from use or disclosure. If you are not the intended recipient, you are strictly prohibited from reviewing, disclosing, copying using or disseminating any of this information or taking any action in reliance on or regarding this information. If you have received this fax in error, please notify us immediately by telephone so that we can arrange for its return to [name]. Phone: [phone], Toll-Free: [phone], Fax: [phone] Page: 2 of 2 Call Id: [id number] 12/15/2020 5:37:04 PM See PCP within 24 Hours Yes Terence Lux, RN, Lonna Duval Disagree/Comply Comply Caller Understands Yes PreDisposition Call Doctor Care Advice Given Per Guideline SEE PCP WITHIN 24 HOURS: CALL BACK IF: * Fever occurs * Unable to urinate and bladder feels full * You become worse CARE ADVICE given per Urinary Symptoms (Adult) guideline. Referrals REFERRED TO PCP OFFICE Warm transfer to backlin

## 2020-12-16 NOTE — Telephone Encounter (Signed)
Scheduled to see Alyssa today

## 2020-12-16 NOTE — Patient Instructions (Signed)
You may have a urinary infection. I will call with urine culture results and then treat based on sensitivity report, especially given your numerous allergies. Please continue to drink plenty of water in the meantime. Should you have sudden fever, worsening pain, or blood in the urine, recheck right away.

## 2020-12-16 NOTE — Progress Notes (Signed)
Acute Office Visit  Subjective:    Patient ID: Alicia Vasquez, female    DOB: Jun 08, 1940, 80 y.o.   MRN: [id number]  Chief Complaint  Patient presents with   Urinary Frequency    Flank pain    Urinary Frequency  This is a recurrent problem. The current episode started in the past 7 days. The problem has been gradually worsening. The quality of the pain is described as aching. There has been no fever. Associated symptoms include flank pain and frequency (+ odor and cloudiness). Pertinent negatives include no hematuria, nausea or vomiting.   Patient is in today for urinary frequency x 1 week. She is HOH. Friend is here to help.    Past Medical History:  Diagnosis Date   Allergy    Anxiety    Arthritis    Blood transfusion without reported diagnosis    Breast cancer ([place]) 07/12/2013   Invasive Mammary Carcinoma   DVT (deep vein thrombosis) in pregnancy    Hypertension    Hypothyroid    Metastatic cancer to bone ([facility]) dx'd 12/2016   hip   Personal history of radiation therapy    Pneumonia    Radiation 11/21/13-01/07/14   Right Breast/Supraclavicular    Past Surgical History:  Procedure Laterality Date   BREAST LUMPECTOMY Left 2015   BREAST LUMPECTOMY WITH RADIOACTIVE SEED LOCALIZATION Right 09/09/2013   Procedure: BREAST LUMPECTOMY WITH RADIOACTIVE SEED LOCALIZATION WITH AXILLARY NODE EXCISION;  Surgeon: [name], MD;  Location: [facility];  Service: General;  Laterality: Right;   DENTAL SURGERY  04/19/2012   13 TEETH REMOVED   DILATION AND CURETTAGE OF UTERUS     ORIF PERIPROSTHETIC FRACTURE Left 01/31/2017   Procedure: REVISION and OPEN REDUCTION INTERNAL FIXATION (ORIF) PERIPROSTHETIC FRACTURE LEFT HIP;  Surgeon: Paralee Cancel, MD;  [facility];  Service: Orthopedics;  Laterality: Left;  120 mins   RE-EXCISION OF BREAST LUMPECTOMY Right 09/24/2013   Procedure: RE-EXCISION OF RIGHT BREAST LUMPECTOMY;  Surgeon: [name], MD;   Location: [facility];  Service: General;  Laterality: Right;   TOTAL HIP ARTHROPLASTY Left 01/16/2017   Procedure: TOTAL HIP ARTHROPLASTY POSTERIOR;  Surgeon: Paralee Cancel, MD;  Location[facility];  Service: Orthopedics;  Laterality: Left;    Family History  Problem Relation Age of Onset   Heart disease Brother    Colon cancer Brother    Prostate cancer Brother     Social History   Socioeconomic History   Marital status: Widowed    Spouse name: Not on file   Number of children: 1   Years of education: Not on file   Highest education level: Not on file  Occupational History   Not on file  Tobacco Use   Smoking status: Never   Smokeless tobacco: Never  Substance and Sexual Activity   Alcohol use: No    Alcohol/week: 0.0 standard drinks   Drug use: No   Sexual activity: Never  Other Topics Concern   Not on file  Social History Narrative   Exercise: yard work.   Social Determinants of Health   Financial Resource Strain: Low Risk    Difficulty of Paying Living Expenses: Not hard at all  Food Insecurity: No Food Insecurity   Worried About Charity fundraiser in the Last Year: Never true   [place] in the Last Year: Never true  Transportation Needs: No Transportation Needs   Lack of Transportation (Medical): No   Lack  of Transportation (Non-Medical): No  Physical Activity: Inactive   Days of Exercise per Week: 0 days   Minutes of Exercise per Session: 0 min  Stress: No Stress Concern Present   Feeling of Stress : Not at all  Social Connections: Socially Isolated   Frequency of Communication with Friends and Family: More than three times a week   Frequency of Social Gatherings with Friends and Family: Not on file   Attends Religious Services: Never   Marine scientist or Organizations: No   Attends Archivist Meetings: Never   Marital Status: Widowed  Human resources officer Violence: Not At Risk   Fear of Current or Ex-Partner: No    Emotionally Abused: No   Physically Abused: No   Sexually Abused: No    Outpatient Medications Prior to Visit  Medication Sig Dispense Refill   B-D TB SYRINGE 1CC/27GX1/2" 27G X 1/2" 1 ML MISC USE TO DRAW UP 1 ML LIDOCAINE AND INJECT INTO CEFTRIAXONE VIAL FOR DILUTION. CHANGE TO 25G NEEDLE FOR ADMINISTRATION     Cholecalciferol (VITAMIN D3) 5000 UNITS TABS Take 5,000 Units by mouth daily.      clotrimazole-betamethasone (LOTRISONE) lotion Apply topically 2 (two) times daily. 60 mL 0   HYDROcodone-acetaminophen (NORCO/VICODIN) 5-325 MG tablet Take 1 tablet by mouth every 6 (six) hours as needed for moderate pain. 120 tablet 0   ketoconazole (NIZORAL) 2 % cream Apply 1 application topically daily. 15 g 0   lidocaine (XYLOCAINE) 1 % (with preservative) injection      NEEDLE, DISP, 25 G 25G X 1-1/2" MISC Please use to administer rocephin injection 15 each 0   SYNTHROID 125 MCG tablet TAKE 1 TABLET BY MOUTH EVERY DAY BEFORE BREAKFAST 90 tablet 4   Syringe, Disposable, 5 ML MISC Please use to inject rocephin injection 5 each 0   vitamin C (ASCORBIC ACID) 250 MG tablet Take 250 mg by mouth 3 (three) times daily.     warfarin (COUMADIN) 5 MG tablet Take 7.5 mg daily. 270 tablet 1   No facility-administered medications prior to visit.    Allergies  Allergen Reactions   Anesthetics, Amide Hypertension   Benadryl [Diphenhydramine Hcl] Other (See Comments)    Dizziness   Carbocaine [Mepivacaine Hcl] Hypertension   Codeine Other (See Comments)    Dizziness   Epinephrine Hypertension   Sulfa Antibiotics Other (See Comments)    dizziness   Diphenhydramine    Latex Other (See Comments)    Blisters in mouth   Tramadol     Sedation.    Penicillins Rash    Has patient had a PCN reaction causing immediate rash, facial/tongue/throat swelling, SOB or lightheadedness with hypotension: Unknown Has patient had a PCN reaction causing severe rash involving mucus membranes or skin necrosis: Unknown Has  patient had a PCN reaction that required hospitalization: Unknown Has patient had a PCN reaction occurring within the last 10 years: Unknown If all of the above answers are "NO", then may proceed with Cephalosporin use.     Review of Systems  Constitutional:  Negative for fatigue and fever.  Gastrointestinal:  Positive for abdominal pain (mild suprapubic). Negative for nausea and vomiting.  Genitourinary:  Positive for dysuria, flank pain and frequency (+ odor and cloudiness). Negative for hematuria.  Neurological:  Negative for dizziness and headaches.      Objective:    Physical Exam Vitals and nursing note reviewed.  Constitutional:      General: She is not in acute distress.  Appearance: Normal appearance. She is not ill-appearing.     Comments: She is in a wheelchair  HENT:     Head: Normocephalic and atraumatic.  Cardiovascular:     Rate and Rhythm: Normal rate and regular rhythm.     Pulses: Normal pulses.     Heart sounds: Normal heart sounds.  Pulmonary:     Effort: Pulmonary effort is normal.     Breath sounds: Normal breath sounds.  Abdominal:     General: Abdomen is flat. Bowel sounds are normal.     Palpations: Abdomen is soft.     Tenderness: There is no right CVA tenderness or left CVA tenderness.  Skin:    General: Skin is warm and dry.  Neurological:     General: No focal deficit present.     Mental Status: She is alert.  Psychiatric:        Mood and Affect: Mood normal.    BP (!) 151/87   Pulse 82   Temp 98.2 F (36.8 C)   Ht _0  (1.626 m)   SpO2 94%   BMI 39.03 kg/m  Wt Readings from Last 3 Encounters:  10/19/20 227 lb 6.4 oz (103.1 kg)  10/01/20 227 lb 4.8 oz (103.1 kg)  07/27/20 232 lb 8 oz (105.5 kg)    Health Maintenance Due  Topic Date Due   COVID-19 Vaccine (1) Never done   Zoster Vaccines- Shingrix (1 of 2) Never done    There are no preventive care reminders to display for this patient.   Lab Results  Component Value  Date   TSH 0.88 10/19/2020   Lab Results  Component Value Date   WBC 8.0 11/26/2020   HGB 13.0 11/26/2020   HCT 40.8 11/26/2020   MCV 87.7 11/26/2020   PLT 258 11/26/2020   Lab Results  Component Value Date   NA 139 11/26/2020   K 4.3 11/26/2020   CHLORIDE 105 05/16/2017   CO2 25 11/26/2020   GLUCOSE 91 11/26/2020   BUN 17 11/26/2020   CREATININE 0.78 11/26/2020   BILITOT 0.4 11/26/2020   ALKPHOS 104 11/26/2020   AST 13 (L) 11/26/2020   ALT 11 11/26/2020   PROT 7.0 11/26/2020   ALBUMIN 3.6 11/26/2020   CALCIUM 10.9 (H) 11/26/2020   ANIONGAP 7 11/26/2020   EGFR >60 05/16/2017   GFR 59.13 (L) 12/05/2016   Lab Results  Component Value Date   CHOL 125 01/17/2017   Lab Results  Component Value Date   HDL 32 (L) 01/17/2017   Lab Results  Component Value Date   LDLCALC 80 01/17/2017   Lab Results  Component Value Date   TRIG 65 01/17/2017   Lab Results  Component Value Date   CHOLHDL 3.9 01/17/2017   No results found for: HGBA1C     Assessment & Plan:   Problem List Items Addressed This Visit   None Visit Diagnoses     Urinary frequency    -  Primary   Relevant Orders   POCT Urinalysis Dipstick (Automated)   Urine Culture       1. Urinary frequency Urinalysis    Component Value Date/Time   COLORURINE YELLOW 07/09/2020 1255   APPEARANCEUR HAZY (A) 07/09/2020 1255   LABSPEC 1.015 07/09/2020 1255   LABSPEC 1.010 09/22/2016 1553   PHURINE 6.0 07/09/2020 [place] 07/09/2020 1255   GLUCOSEU Negative 09/22/2016 1553   HGBUR SMALL (A) 07/09/2020 1255   BILIRUBINUR neg 12/16/2020 1116  BILIRUBINUR Negative 09/22/2016 [place] 07/09/2020 1255   PROTEINUR Positive (A) 12/16/2020 1116   PROTEINUR NEGATIVE 07/09/2020 1255   UROBILINOGEN negative (A) 12/16/2020 1116   UROBILINOGEN 0.2 11/15/2016 1358   UROBILINOGEN 0.2 09/22/2016 1553   NITRITE neg 12/16/2020 1116   NITRITE POSITIVE (A) 07/09/2020 1255    LEUKOCYTESUR Moderate (2+) (A) 12/16/2020 1116   LEUKOCYTESUR SMALL (A) 07/09/2020 1255   LEUKOCYTESUR Negative 09/22/2016 1553   She has hx of UTIs. Numerous allergies to medications. Last treatment plan included Rocephin 500 mg IM x 5 days by home health agency. Will culture urine and then treat accordingly. Should she suddenly worsen, she knows to go to the ED. She will continue to push fluids.   Zain Bingman M Renika Shiflet, PA-C

## 2020-12-18 ENCOUNTER — Telehealth: Payer: Self-pay

## 2020-12-18 LAB — URINE CULTURE
MICRO NUMBER:: 12141719
SPECIMEN QUALITY:: ADEQUATE

## 2020-12-18 NOTE — Telephone Encounter (Addendum)
Patient is calling in requesting urine culture to be review.    Looks like culture has not came back yet.  I reviewed OV with patient stating that Alyssa Allwardt did advise for her to go to ED if she gets worse between now and the time that the culture comes back.    Patient states she will not go to the ED but would go to the cancer center.  I have sent patient over to [facility] for triage.

## 2020-12-21 ENCOUNTER — Telehealth: Payer: Self-pay

## 2020-12-21 ENCOUNTER — Other Ambulatory Visit: Payer: Self-pay

## 2020-12-21 MED ORDER — CEPHALEXIN 500 MG PO CAPS: 500.0000 mg | ORAL_CAPSULE | Freq: Two times a day (BID) | ORAL | 0 refills | Status: AC

## 2020-12-21 NOTE — Telephone Encounter (Signed)
Nurse Assessment Nurse: Matthew Folks, RN, Hannelore Date/Time (Eastern Time): 12/18/2020 5:57:21 PM Confirm and document reason for call. If symptomatic, describe symptoms. ---Caller states she had a urine culture on the 20th. She is in a [facility] for bone cancer. She took two pain pills when she normally takes one. The burning with urination and frequency started last week. No fever. Normal color urine. Pain is both sides of her back. Didn't have pain in back before. Does the patient have any new or worsening symptoms? ---Yes Will a triage be completed? ---Yes Related visit to physician within the last 2 weeks? ---Yes Does the PT have any chronic conditions? (i.e. diabetes, asthma, this includes High risk factors for pregnancy, etc.) ---Yes List chronic conditions. ---hypothyroid, vit D def, warfarin for blood clot in her leg after surgery. Had breast surgery R breast, then she developed a tumor in her right hip and it broke and she had to have this fixed twice. Is this a behavioral health or substance abuse call? ---No PLEASE NOTE: All timestamps contained within this report are represented as [place] Standard Time. CONFIDENTIALTY NOTICE: This fax transmission is intended only for the addressee. It contains information that is legally privileged, confidential or otherwise protected from use or disclosure. If you are not the intended recipient, you are strictly prohibited from reviewing, disclosing, copying using or disseminating any of this information or taking any action in reliance on or regarding this information. If you have received this fax in error, please notify us immediately by telephone so that we can arrange for its return to [name]. Phone: [phone], Toll-Free: [phone], Fax: [phone] Page: 2 of 2 Call Id: [id number] Guidelines Guideline Title Affirmed Question Affirmed Notes Nurse Date/Time Eilene Ghazi Time) Urinary Symptoms Side (flank) or lower back pain  present Shook-Minyard, RN, Hannelore 12/18/2020 6:02:05 PM Disp. Time Eilene Ghazi Time) Disposition Final User 12/18/2020 6:09:12 PM Go to ED Now (or PCP triage) Yes Shook-Minyard, RN, Hannelore Disposition Overriden: See PCP within 24 Hours Override Reason: Patient's symptoms need a higher level of care Caller Disagree/Comply Disagree Caller Understands Yes PreDisposition Call Doctor Care Advice Given Per Guideline GO TO ED NOW (OR PCP TRIAGE): CARE ADVICE given per Urinary Symptoms (Adult) guideline. Comments User: Janece Canterbury, RN Date/Time Eilene Ghazi Time): 12/18/2020 6:03:15 PM She has had a previous pelvic fx as well. User: Janece Canterbury, RN Date/Time Eilene Ghazi Time): 12/18/2020 6:12:08 PM Refuses to go to the ER and sit. She will go to the Cancer center. She is not sure why this culture is taking so long. They did not start her on any antibiotics at the office after taking it. Explained to her it can take up to 3d for a culture and then they have to see what meds the organism is sensitive to. Suggested Cranberry juice, AZO. Referrals [facility] - ED

## 2020-12-21 NOTE — Telephone Encounter (Signed)
Please see Triage note

## 2020-12-21 NOTE — Telephone Encounter (Signed)
Noted. Isla Pence is taking care of this through the urine culture results tab.

## 2020-12-24 ENCOUNTER — Inpatient Hospital Stay: Payer: Medicare Other | Attending: Oncology

## 2020-12-24 ENCOUNTER — Other Ambulatory Visit: Payer: Self-pay

## 2020-12-24 ENCOUNTER — Telehealth: Payer: Self-pay | Admitting: *Deleted

## 2020-12-24 ENCOUNTER — Inpatient Hospital Stay: Payer: Medicare Other

## 2020-12-24 VITALS — BP 160/72 | HR 82 | Resp 18

## 2020-12-24 DIAGNOSIS — M84659P Pathological fracture in other disease, hip, unspecified, subsequent encounter for fracture with malunion: Secondary | ICD-10-CM

## 2020-12-24 DIAGNOSIS — Z86718 Personal history of other venous thrombosis and embolism: Secondary | ICD-10-CM | POA: Insufficient documentation

## 2020-12-24 DIAGNOSIS — C7951 Secondary malignant neoplasm of bone: Secondary | ICD-10-CM | POA: Insufficient documentation

## 2020-12-24 DIAGNOSIS — Z170 Estrogen receptor positive status [ER+]: Secondary | ICD-10-CM | POA: Diagnosis not present

## 2020-12-24 DIAGNOSIS — C50411 Malignant neoplasm of upper-outer quadrant of right female breast: Secondary | ICD-10-CM | POA: Insufficient documentation

## 2020-12-24 DIAGNOSIS — Z5111 Encounter for antineoplastic chemotherapy: Secondary | ICD-10-CM | POA: Insufficient documentation

## 2020-12-24 DIAGNOSIS — I82401 Acute embolism and thrombosis of unspecified deep veins of right lower extremity: Secondary | ICD-10-CM

## 2020-12-24 DIAGNOSIS — M899 Disorder of bone, unspecified: Secondary | ICD-10-CM

## 2020-12-24 LAB — CBC WITH DIFFERENTIAL (CANCER CENTER ONLY)
Abs Immature Granulocytes: 0.04 10*3/uL (ref 0.00–0.07)
Basophils Absolute: 0.1 10*3/uL (ref 0.0–0.1)
Basophils Relative: 1 %
Eosinophils Absolute: 0.3 10*3/uL (ref 0.0–0.5)
Eosinophils Relative: 3 %
HCT: 40 % (ref 36.0–46.0)
Hemoglobin: 12.8 g/dL (ref 12.0–15.0)
Immature Granulocytes: 0 %
Lymphocytes Relative: 12 %
Lymphs Abs: 1.1 10*3/uL (ref 0.7–4.0)
MCH: 28.3 pg (ref 26.0–34.0)
MCHC: 32 g/dL (ref 30.0–36.0)
MCV: 88.3 fL (ref 80.0–100.0)
Monocytes Absolute: 1.1 10*3/uL — ABNORMAL HIGH (ref 0.1–1.0)
Monocytes Relative: 12 %
Neutro Abs: 6.5 10*3/uL (ref 1.7–7.7)
Neutrophils Relative %: 72 %
Platelet Count: 272 10*3/uL (ref 150–400)
RBC: 4.53 MIL/uL (ref 3.87–5.11)
RDW: 15.9 % — ABNORMAL HIGH (ref 11.5–15.5)
WBC Count: 9 10*3/uL (ref 4.0–10.5)
nRBC: 0 % (ref 0.0–0.2)

## 2020-12-24 LAB — CMP (CANCER CENTER ONLY)
ALT: 13 U/L (ref 0–44)
AST: 12 U/L — ABNORMAL LOW (ref 15–41)
Albumin: 3.7 g/dL (ref 3.5–5.0)
Alkaline Phosphatase: 152 U/L — ABNORMAL HIGH (ref 38–126)
Anion gap: 6 (ref 5–15)
BUN: 17 mg/dL (ref 8–23)
CO2: 27 mmol/L (ref 22–32)
Calcium: 11.2 mg/dL — ABNORMAL HIGH (ref 8.9–10.3)
Chloride: 107 mmol/L (ref 98–111)
Creatinine: 0.82 mg/dL (ref 0.44–1.00)
GFR, Estimated: >60 mL/min (ref >60)
Glucose, Bld: 99 mg/dL (ref 70–99)
Potassium: 4.1 mmol/L (ref 3.5–5.1)
Sodium: 140 mmol/L (ref 135–145)
Total Bilirubin: 0.4 mg/dL (ref 0.3–1.2)
Total Protein: 7.1 g/dL (ref 6.5–8.1)

## 2020-12-24 LAB — PROTIME-INR
INR: 2.5 — ABNORMAL HIGH (ref 0.8–1.2)
Prothrombin Time: 27 seconds — ABNORMAL HIGH (ref 11.4–15.2)

## 2020-12-24 MED ORDER — FULVESTRANT 250 MG/5ML IM SOLN
INTRAMUSCULAR | Status: AC
  Filled 2020-12-24: qty 10

## 2020-12-24 MED ORDER — FULVESTRANT 250 MG/5ML IM SOLN
500.0000 mg | Freq: Once | INTRAMUSCULAR | Status: AC
  Administered 2020-12-24: 500 mg via INTRAMUSCULAR

## 2020-12-24 NOTE — Telephone Encounter (Signed)
This RN called pt per INR obtained today - obtained VM-message left stating INR is in therapeutic range and to continue current dose. This RN also requested a return call from the pt for verification that she received this message.

## 2020-12-24 NOTE — Patient Instructions (Signed)
Fulvestrant injection What is this medication? FULVESTRANT (ful VES trant) blocks the effects of estrogen. It is used to treat breast cancer. This medicine may be used for other purposes; ask your health care provider or pharmacist if you have questions. COMMON BRAND NAME(S): FASLODEX What should I tell my care team before I take this medication? They need to know if you have any of these conditions: bleeding disorders liver disease low blood counts, like low white cell, platelet, or red cell counts an unusual or allergic reaction to fulvestrant, other medicines, foods, dyes, or preservatives pregnant or trying to get pregnant breast-feeding How should I use this medication? This medicine is for injection into a muscle. It is usually given by a health care professional in a hospital or clinic setting. Talk to your pediatrician regarding the use of this medicine in children. Special care may be needed. Overdosage: If you think you have taken too much of this medicine contact a poison control center or emergency room at once. NOTE: This medicine is only for you. Do not share this medicine with others. What if I miss a dose? It is important not to miss your dose. Call your doctor or health care professional if you are unable to keep an appointment. What may interact with this medication? medicines that treat or prevent blood clots like warfarin, enoxaparin, dalteparin, apixaban, dabigatran, and rivaroxaban This list may not describe all possible interactions. Give your health care provider a list of all the medicines, herbs, non-prescription drugs, or dietary supplements you use. Also tell them if you smoke, drink alcohol, or use illegal drugs. Some items may interact with your medicine. What should I watch for while using this medication? Your condition will be monitored carefully while you are receiving this medicine. You will need important blood work done while you are taking this  medicine. Do not become pregnant while taking this medicine or for at least 1 year after stopping it. Women of child-bearing potential will need to have a negative pregnancy test before starting this medicine. Women should inform their doctor if they wish to become pregnant or think they might be pregnant. There is a potential for serious side effects to an unborn child. Men should inform their doctors if they wish to father a child. This medicine may lower sperm counts. Talk to your health care professional or pharmacist for more information. Do not breast-feed an infant while taking this medicine or for 1 year after the last dose. What side effects may I notice from receiving this medication? Side effects that you should report to your doctor or health care professional as soon as possible: allergic reactions like skin rash, itching or hives, swelling of the face, lips, or tongue feeling faint or lightheaded, falls pain, tingling, numbness, or weakness in the legs signs and symptoms of infection like fever or chills; cough; flu-like symptoms; sore throat vaginal bleeding Side effects that usually do not require medical attention (report to your doctor or health care professional if they continue or are bothersome): aches, pains constipation diarrhea headache hot flashes nausea, vomiting pain at site where injected stomach pain This list may not describe all possible side effects. Call your doctor for medical advice about side effects. You may report side effects to FDA at 1-800-FDA-1088. Where should I keep my medication? This drug is given in a hospital or clinic and will not be stored at home. NOTE: This sheet is a summary. It may not cover all possible information. If you have   questions about this medicine, talk to your doctor, pharmacist, or health care provider.  2022 Elsevier/Gold Standard (2017-08-24 11:34:41)  

## 2020-12-25 LAB — CANCER ANTIGEN 27.29: CA 27.29: 41.9 U/mL — ABNORMAL HIGH (ref 0.0–38.6)

## 2021-01-04 ENCOUNTER — Inpatient Hospital Stay (HOSPITAL_COMMUNITY)
Admission: EM | Admit: 2021-01-04 | Discharge: 2021-01-12 | DRG: 660 | Disposition: A | Discharge status: Skilled Nursing Facility | Payer: Medicare Other | Attending: Internal Medicine | Admitting: Internal Medicine

## 2021-01-04 ENCOUNTER — Other Ambulatory Visit: Payer: Self-pay

## 2021-01-04 ENCOUNTER — Emergency Department (HOSPITAL_COMMUNITY): Payer: Medicare Other

## 2021-01-04 ENCOUNTER — Encounter (HOSPITAL_COMMUNITY): Payer: Self-pay

## 2021-01-04 ENCOUNTER — Other Ambulatory Visit (HOSPITAL_COMMUNITY): Payer: Self-pay

## 2021-01-04 DIAGNOSIS — R630 Anorexia: Secondary | ICD-10-CM | POA: Diagnosis not present

## 2021-01-04 DIAGNOSIS — W19XXXA Unspecified fall, initial encounter: Secondary | ICD-10-CM

## 2021-01-04 DIAGNOSIS — Z888 Allergy status to other drugs, medicaments and biological substances status: Secondary | ICD-10-CM

## 2021-01-04 DIAGNOSIS — Z6841 Body Mass Index (BMI) 40.0 and over, adult: Secondary | ICD-10-CM

## 2021-01-04 DIAGNOSIS — D6859 Other primary thrombophilia: Secondary | ICD-10-CM | POA: Diagnosis not present

## 2021-01-04 DIAGNOSIS — R0689 Other abnormalities of breathing: Secondary | ICD-10-CM | POA: Diagnosis not present

## 2021-01-04 DIAGNOSIS — Z7901 Long term (current) use of anticoagulants: Secondary | ICD-10-CM | POA: Diagnosis not present

## 2021-01-04 DIAGNOSIS — M47812 Spondylosis without myelopathy or radiculopathy, cervical region: Secondary | ICD-10-CM | POA: Diagnosis not present

## 2021-01-04 DIAGNOSIS — M4854XA Collapsed vertebra, not elsewhere classified, thoracic region, initial encounter for fracture: Secondary | ICD-10-CM | POA: Diagnosis not present

## 2021-01-04 DIAGNOSIS — N179 Acute kidney failure, unspecified: Secondary | ICD-10-CM | POA: Diagnosis present

## 2021-01-04 DIAGNOSIS — Z91040 Latex allergy status: Secondary | ICD-10-CM

## 2021-01-04 DIAGNOSIS — D649 Anemia, unspecified: Secondary | ICD-10-CM | POA: Diagnosis present

## 2021-01-04 DIAGNOSIS — E039 Hypothyroidism, unspecified: Secondary | ICD-10-CM | POA: Diagnosis present

## 2021-01-04 DIAGNOSIS — R0902 Hypoxemia: Secondary | ICD-10-CM | POA: Diagnosis not present

## 2021-01-04 DIAGNOSIS — Z8249 Family history of ischemic heart disease and other diseases of the circulatory system: Secondary | ICD-10-CM

## 2021-01-04 DIAGNOSIS — E860 Dehydration: Secondary | ICD-10-CM | POA: Diagnosis present

## 2021-01-04 DIAGNOSIS — M4856XA Collapsed vertebra, not elsewhere classified, lumbar region, initial encounter for fracture: Secondary | ICD-10-CM | POA: Diagnosis present

## 2021-01-04 DIAGNOSIS — E213 Hyperparathyroidism, unspecified: Secondary | ICD-10-CM | POA: Diagnosis present

## 2021-01-04 DIAGNOSIS — Z853 Personal history of malignant neoplasm of breast: Secondary | ICD-10-CM

## 2021-01-04 DIAGNOSIS — Z8042 Family history of malignant neoplasm of prostate: Secondary | ICD-10-CM

## 2021-01-04 DIAGNOSIS — I4891 Unspecified atrial fibrillation: Secondary | ICD-10-CM | POA: Diagnosis present

## 2021-01-04 DIAGNOSIS — S0990XA Unspecified injury of head, initial encounter: Secondary | ICD-10-CM | POA: Diagnosis not present

## 2021-01-04 DIAGNOSIS — N3000 Acute cystitis without hematuria: Secondary | ICD-10-CM | POA: Diagnosis not present

## 2021-01-04 DIAGNOSIS — F5000 Anorexia nervosa, unspecified: Secondary | ICD-10-CM | POA: Diagnosis not present

## 2021-01-04 DIAGNOSIS — R0602 Shortness of breath: Secondary | ICD-10-CM

## 2021-01-04 DIAGNOSIS — R231 Pallor: Secondary | ICD-10-CM | POA: Diagnosis not present

## 2021-01-04 DIAGNOSIS — Z96642 Presence of left artificial hip joint: Secondary | ICD-10-CM | POA: Diagnosis present

## 2021-01-04 DIAGNOSIS — Z20822 Contact with and (suspected) exposure to covid-19: Secondary | ICD-10-CM | POA: Diagnosis not present

## 2021-01-04 DIAGNOSIS — C7951 Secondary malignant neoplasm of bone: Secondary | ICD-10-CM | POA: Diagnosis not present

## 2021-01-04 DIAGNOSIS — M84559P Pathological fracture in neoplastic disease, hip, unspecified, subsequent encounter for fracture with malunion: Secondary | ICD-10-CM

## 2021-01-04 DIAGNOSIS — E876 Hypokalemia: Secondary | ICD-10-CM | POA: Diagnosis present

## 2021-01-04 DIAGNOSIS — Z882 Allergy status to sulfonamides status: Secondary | ICD-10-CM

## 2021-01-04 DIAGNOSIS — N136 Pyonephrosis: Principal | ICD-10-CM | POA: Diagnosis present

## 2021-01-04 DIAGNOSIS — T07XXXA Unspecified multiple injuries, initial encounter: Secondary | ICD-10-CM

## 2021-01-04 DIAGNOSIS — I82542 Chronic embolism and thrombosis of left tibial vein: Secondary | ICD-10-CM | POA: Diagnosis present

## 2021-01-04 DIAGNOSIS — Z800 Family history of malignant neoplasm of digestive organs: Secondary | ICD-10-CM

## 2021-01-04 DIAGNOSIS — I10 Essential (primary) hypertension: Secondary | ICD-10-CM | POA: Diagnosis present

## 2021-01-04 DIAGNOSIS — R000 Tachycardia, unspecified: Secondary | ICD-10-CM | POA: Diagnosis not present

## 2021-01-04 DIAGNOSIS — Z923 Personal history of irradiation: Secondary | ICD-10-CM

## 2021-01-04 DIAGNOSIS — I82409 Acute embolism and thrombosis of unspecified deep veins of unspecified lower extremity: Secondary | ICD-10-CM | POA: Diagnosis present

## 2021-01-04 DIAGNOSIS — Z043 Encounter for examination and observation following other accident: Secondary | ICD-10-CM | POA: Diagnosis not present

## 2021-01-04 DIAGNOSIS — R079 Chest pain, unspecified: Secondary | ICD-10-CM | POA: Diagnosis not present

## 2021-01-04 DIAGNOSIS — Z86718 Personal history of other venous thrombosis and embolism: Secondary | ICD-10-CM

## 2021-01-04 DIAGNOSIS — R102 Pelvic and perineal pain: Secondary | ICD-10-CM | POA: Diagnosis not present

## 2021-01-04 DIAGNOSIS — N202 Calculus of kidney with calculus of ureter: Secondary | ICD-10-CM | POA: Diagnosis present

## 2021-01-04 DIAGNOSIS — Z880 Allergy status to penicillin: Secondary | ICD-10-CM

## 2021-01-04 LAB — CBC WITH DIFFERENTIAL/PLATELET
Abs Immature Granulocytes: 0.06 10*3/uL (ref 0.00–0.07)
Basophils Absolute: 0 10*3/uL (ref 0.0–0.1)
Basophils Relative: 0 %
Eosinophils Absolute: 0 10*3/uL (ref 0.0–0.5)
Eosinophils Relative: 0 %
HCT: 42.3 % (ref 36.0–46.0)
Hemoglobin: 13.6 g/dL (ref 12.0–15.0)
Immature Granulocytes: 0 %
Lymphocytes Relative: 5 %
Lymphs Abs: 0.8 10*3/uL (ref 0.7–4.0)
MCH: 28.6 pg (ref 26.0–34.0)
MCHC: 32.2 g/dL (ref 30.0–36.0)
MCV: 88.9 fL (ref 80.0–100.0)
Monocytes Absolute: 2.2 10*3/uL — ABNORMAL HIGH (ref 0.1–1.0)
Monocytes Relative: 14 %
Neutro Abs: 13.2 10*3/uL — ABNORMAL HIGH (ref 1.7–7.7)
Neutrophils Relative %: 81 %
Platelets: 211 10*3/uL (ref 150–400)
RBC: 4.76 MIL/uL (ref 3.87–5.11)
RDW: 16.2 % — ABNORMAL HIGH (ref 11.5–15.5)
WBC: 16.3 10*3/uL — ABNORMAL HIGH (ref 4.0–10.5)
nRBC: 0 % (ref 0.0–0.2)

## 2021-01-04 NOTE — ED Provider Notes (Signed)
[facility] DEPT Provider Note   CSN: [id number] Arrival date & time: 01/04/21  2132     History Chief Complaint  Patient presents with   Alicia Vasquez is a 80 y.o. female.  HPI She presents for evaluation of injuries that occurred around 4:30 PM yesterday.  She states she was sitting in her wheelchair and suddenly tumbled forward, injuring the right side of her forehead, and both hips.  Her son was with her and try to get her into a chair but she was unable to.  EMS was called and they placed her in a chair and decided not to transfer her.  Patient states that since that time she has been unable to get out of the chair because of pain.  She reports not eating much in several days and she is not sure why.  She reports she is taking her usual medications.  No known sick contacts.  She denies fever, chills, cough or shortness of breath.  She has not had any nausea or vomiting.  She does not have dysuria or urinary frequency.  There is been no diarrhea.  There are no other known active modifying factors.  Past Medical History:  Diagnosis Date   Allergy    Anxiety    Arthritis    Blood transfusion without reported diagnosis    Breast cancer ([facility]) 07/12/2013   Invasive Mammary Carcinoma   DVT (deep vein thrombosis) in pregnancy    Hypertension    Hypothyroid    Metastatic cancer to bone ([place]) dx'd 12/2016   hip   Personal history of radiation therapy    Pneumonia    Radiation 11/21/13-01/07/14   Right Breast/Supraclavicular    Patient Active Problem List   Diagnosis Date Noted   Aortic atherosclerosis ([place]) 06/13/2017   Pain from bone metastases ([place]) 01/25/2017   Bone metastases ([place]) 01/25/2017   Endometrial hyperplasia 01/16/2017   Lytic bone lesion of left femur 01/15/2017   Hip fracture, pathological ([place]) 01/15/2017   Lymphedema 09/17/2015   Long term current use of anticoagulant therapy 08/23/2015   DVT, lower extremity ([place])  06/18/2015   Bilateral knee pain 04/03/2015   Arm edema 08/28/2014   Vitamin D deficiency 04/23/2014   Hyperparathyroidism ([place]) 04/23/2014   Depression 07/18/2013   Malignant neoplasm of upper-outer quadrant of right breast in female, estrogen receptor positive ([place]) 07/15/2013   Overactive bladder 01/30/2013   Primary hypercoagulable state ([place]) 12/19/2012   HTN (hypertension) 09/27/2011   Hearing loss 09/27/2011   Seasonal allergies 09/27/2011   Hypothyroid 08/26/2011    Past Surgical History:  Procedure Laterality Date   BREAST LUMPECTOMY Left 2015   BREAST LUMPECTOMY WITH RADIOACTIVE SEED LOCALIZATION Right 09/09/2013   Procedure: BREAST LUMPECTOMY WITH RADIOACTIVE SEED LOCALIZATION WITH AXILLARY NODE EXCISION;  Surgeon: [name], MD;  Location: [facility];  Service: General;  Laterality: Right;   DENTAL SURGERY  04/19/2012   13 TEETH REMOVED   DILATION AND CURETTAGE OF UTERUS     ORIF PERIPROSTHETIC FRACTURE Left 01/31/2017   Procedure: REVISION and OPEN REDUCTION INTERNAL FIXATION (ORIF) PERIPROSTHETIC FRACTURE LEFT HIP;  Surgeon: Paralee Cancel, MD;  [facility];  Service: Orthopedics;  Laterality: Left;  120 mins   RE-EXCISION OF BREAST LUMPECTOMY Right 09/24/2013   Procedure: RE-EXCISION OF RIGHT BREAST LUMPECTOMY;  Surgeon: [name], MD;  Location: [facility];  Service: General;  Laterality: Right;   TOTAL HIP ARTHROPLASTY Left 01/16/2017  Procedure: TOTAL HIP ARTHROPLASTY POSTERIOR;  Surgeon: Paralee Cancel, MD;  [facility];  Service: Orthopedics;  Laterality: Left;     OB History   No obstetric history on file.     Family History  Problem Relation Age of Onset   Heart disease Brother    Colon cancer Brother    Prostate cancer Brother     Social History   Tobacco Use   Smoking status: Never   Smokeless tobacco: Never  Substance Use Topics   Alcohol use: No    Alcohol/week: 0.0 standard drinks   Drug  use: No    Home Medications Prior to Admission medications   Medication Sig Start Date End Date Taking? Authorizing Provider  B-D TB SYRINGE 1CC/27GX1/2" 27G X 1/2" 1 ML MISC USE TO DRAW UP 1 ML LIDOCAINE AND INJECT INTO CEFTRIAXONE VIAL FOR DILUTION. CHANGE TO 25G NEEDLE FOR ADMINISTRATION 02/20/20   [provider]  Cholecalciferol (VITAMIN D3) 5000 UNITS TABS Take 5,000 Units by mouth daily.     [provider]  clotrimazole-betamethasone (LOTRISONE) lotion Apply topically 2 (two) times daily. 07/27/20   Tanner, Lyndon Code., PA-C  HYDROcodone-acetaminophen (NORCO/VICODIN) 5-325 MG tablet Take 1 tablet by mouth every 6 (six) hours as needed for moderate pain. 11/17/20   Magrinat, [name], MD  ketoconazole (NIZORAL) 2 % cream Apply 1 application topically daily. 12/27/18   Gardenia Phlegm, NP  lidocaine (XYLOCAINE) 1 % (with preservative) injection  02/20/20   [provider]  NEEDLE, DISP, 25 G 25G X 1-1/2" MISC Please use to administer rocephin injection 02/20/20   Leamon Arnt, MD  SYNTHROID 125 MCG tablet TAKE 1 TABLET BY MOUTH EVERY DAY BEFORE BREAKFAST 01/01/20   Magrinat, [name], MD  Syringe, Disposable, 5 ML MISC Please use to inject rocephin injection 02/20/20   Leamon Arnt, MD  vitamin C (ASCORBIC ACID) 250 MG tablet Take 250 mg by mouth 3 (three) times daily.    [provider]  warfarin (COUMADIN) 5 MG tablet Take 7.5 mg daily. 03/24/20   Magrinat, [name], MD    Allergies    Anesthetics, amide; Benadryl [diphenhydramine hcl]; Carbocaine [mepivacaine hcl]; Codeine; Epinephrine; Sulfa antibiotics; Diphenhydramine; Latex; Tramadol; and Penicillins  Review of Systems   Review of Systems  All other systems reviewed and are negative.  Physical Exam Updated Vital Signs BP 118/86 (BP Location: Right Wrist)   Pulse (!) 123   Temp 99.1 F (37.3 C) (Oral)   Resp 20   Ht '5\' 4"'$  (1.626 m)   Wt 102.1 kg   SpO2 94%   BMI 38.62 kg/m    Physical Exam Vitals and nursing note reviewed.  Constitutional:      General: She is not in acute distress.    Appearance: She is well-developed. She is obese. She is not ill-appearing, toxic-appearing or diaphoretic.     Comments: She is hard of hearing  HENT:     Head: Normocephalic and atraumatic.     Comments: No visible injury to head or scalp. Eyes:     Conjunctiva/sclera: Conjunctivae normal.     Pupils: Pupils are equal, round, and reactive to light.  Neck:     Trachea: Phonation normal.  Cardiovascular:     Rate and Rhythm: Normal rate and regular rhythm.  Pulmonary:     Effort: Pulmonary effort is normal.     Breath sounds: Normal breath sounds.  Chest:     Chest wall: Tenderness (Left anterior, mild)  present.  Abdominal:     General: There is no distension.     Palpations: Abdomen is soft.     Tenderness: There is no abdominal tenderness. There is no guarding.  Musculoskeletal:     Cervical back: Normal range of motion and neck supple.     Comments: She guards against movement of both hips, left greater than right secondary to pain.  Normal range of motion, both arms.  Skin:    General: Skin is warm and dry.  Neurological:     Mental Status: She is alert and oriented to person, place, and time.     Motor: No abnormal muscle tone.     Coordination: Coordination normal.  Psychiatric:        Mood and Affect: Mood normal.        Behavior: Behavior normal.        Thought Content: Thought content normal.        Judgment: Judgment normal.    ED Results / Procedures / Treatments   Labs (all labs ordered are listed, but only abnormal results are displayed) Labs Reviewed  RESP PANEL BY RT-PCR (FLU A&B, COVID) [id number]  COMPREHENSIVE METABOLIC PANEL  CBC WITH DIFFERENTIAL/PLATELET  URINALYSIS, ROUTINE W REFLEX MICROSCOPIC    EKG None  Radiology No results found.  Procedures Procedures   Medications Ordered in ED Medications - No data to display  ED  Course  I have reviewed the triage vital signs and the nursing notes.  Pertinent labs & imaging results that were available during my care of the patient were reviewed by me and considered in my medical decision making (see chart for details).    MDM Rules/Calculators/A&P                            Patient Vitals for the past 24 hrs:  BP Temp Temp src Pulse Resp SpO2 Height Weight  01/04/21 2217 -- -- -- -- -- -- '5\' 4"'$  (1.626 m) 102.1 kg  01/04/21 2152 -- -- -- -- -- 94 % -- --  01/04/21 2146 118/86 99.1 F (37.3 C) Oral (!) 123 20 94 % -- --      Medical Decision Making:  This patient is presenting for evaluation of injuries from fall yesterday, which does require a range of treatment options, and is a complaint that involves a high risk of morbidity and mortality. The differential diagnoses include fractures, contusions, pre-existing illness, metabolic disorder. I decided to review old records, and in summary obese female, primarily wheelchair at home, occasionally walks presenting with fall when she tumbled out of her wheelchair yesterday.  She has a history of breast cancer, hypertension, metastatic bone cancer, DVT and is anticoagulated..  I did not require additional historical information from anyone.  Clinical Laboratory Tests Ordered, included CBC, Metabolic panel, Urinalysis, and viral panel . Radiologic Tests Ordered, included CT head and cervical spine, chest x-ray and pelvic x-ray.     Critical Interventions-clinical evaluation, laboratory testing  After These Interventions, the Patient was reevaluated and was found to require imaging for injuries, as well as evaluation for medical status/illness.  CRITICAL CARE-no Performed by: Daleen Bo  Nursing Notes Reviewed/ Care Coordinated Applicable Imaging Reviewed Interpretation of Laboratory Data incorporated into ED treatment  Plan-disposition by Dr. Sedonia Small after return of imaging and laboratory  testing.    Final Clinical Impression(s) / ED Diagnoses Final diagnoses:  Fall, initial encounter  Contusion, multiple sites  Anorexia  Current use of long term anticoagulation    Rx / DC Orders ED Discharge Orders     None        Daleen Bo, MD 01/04/21 2258

## 2021-01-04 NOTE — ED Triage Notes (Signed)
Pt arrived to ER via EMS from home.  Pt fell yesterday evening (mechanical fall).  Pt c/o lower back pain as well as right and left hip and leg pain.  Small hematoma to back right side of head.  Pt takes eliquis.  Pt reports she did not take her eliquis today or yesterday.  EMS reported that pt was also diagnosed with a uti last week and did not finish the course of antibiotics.  Pt feels warm to touch per ems, but temp in route afebrile.  Last set of vitals per ems:HR 108, BP 136/66, 26 RR, etco2 37, cbg 158, 94% on ra. Pt took percocet 2 hours ago.

## 2021-01-05 ENCOUNTER — Encounter (HOSPITAL_COMMUNITY): Payer: Self-pay | Admitting: Internal Medicine

## 2021-01-05 ENCOUNTER — Observation Stay (HOSPITAL_COMMUNITY): Payer: Medicare Other

## 2021-01-05 DIAGNOSIS — S22080A Wedge compression fracture of T11-T12 vertebra, initial encounter for closed fracture: Secondary | ICD-10-CM | POA: Diagnosis not present

## 2021-01-05 DIAGNOSIS — N179 Acute kidney failure, unspecified: Secondary | ICD-10-CM | POA: Diagnosis not present

## 2021-01-05 DIAGNOSIS — N132 Hydronephrosis with renal and ureteral calculous obstruction: Secondary | ICD-10-CM | POA: Diagnosis not present

## 2021-01-05 DIAGNOSIS — N3000 Acute cystitis without hematuria: Secondary | ICD-10-CM | POA: Insufficient documentation

## 2021-01-05 DIAGNOSIS — S3991XA Unspecified injury of abdomen, initial encounter: Secondary | ICD-10-CM | POA: Diagnosis not present

## 2021-01-05 LAB — URINALYSIS, ROUTINE W REFLEX MICROSCOPIC
Bilirubin Urine: NEGATIVE
Glucose, UA: NEGATIVE mg/dL
Hgb urine dipstick: NEGATIVE
Ketones, ur: 5 mg/dL — AB
Nitrite: NEGATIVE
Protein, ur: 100 mg/dL — AB
Specific Gravity, Urine: 1.024 (ref 1.005–1.030)
WBC, UA: >50 WBC/hpf — ABNORMAL HIGH (ref 0–5)
pH: 5 (ref 5.0–8.0)

## 2021-01-05 LAB — CBC
HCT: 38.6 % (ref 36.0–46.0)
Hemoglobin: 12.7 g/dL (ref 12.0–15.0)
MCH: 29.4 pg (ref 26.0–34.0)
MCHC: 32.9 g/dL (ref 30.0–36.0)
MCV: 89.4 fL (ref 80.0–100.0)
Platelets: 187 10*3/uL (ref 150–400)
RBC: 4.32 MIL/uL (ref 3.87–5.11)
RDW: 16.4 % — ABNORMAL HIGH (ref 11.5–15.5)
WBC: 15.3 10*3/uL — ABNORMAL HIGH (ref 4.0–10.5)
nRBC: 0 % (ref 0.0–0.2)

## 2021-01-05 LAB — COMPREHENSIVE METABOLIC PANEL
ALT: 20 U/L (ref 0–44)
AST: 20 U/L (ref 15–41)
Albumin: 3.8 g/dL (ref 3.5–5.0)
Alkaline Phosphatase: 91 U/L (ref 38–126)
Anion gap: 10 (ref 5–15)
BUN: 36 mg/dL — ABNORMAL HIGH (ref 8–23)
CO2: 26 mmol/L (ref 22–32)
Calcium: 10.8 mg/dL — ABNORMAL HIGH (ref 8.9–10.3)
Chloride: 101 mmol/L (ref 98–111)
Creatinine, Ser: 1.72 mg/dL — ABNORMAL HIGH (ref 0.44–1.00)
GFR, Estimated: 30 mL/min — ABNORMAL LOW (ref >60)
Glucose, Bld: 128 mg/dL — ABNORMAL HIGH (ref 70–99)
Potassium: 4.4 mmol/L (ref 3.5–5.1)
Sodium: 137 mmol/L (ref 135–145)
Total Bilirubin: 1.6 mg/dL — ABNORMAL HIGH (ref 0.3–1.2)
Total Protein: 7.3 g/dL (ref 6.5–8.1)

## 2021-01-05 LAB — BASIC METABOLIC PANEL
Anion gap: 10 (ref 5–15)
BUN: 34 mg/dL — ABNORMAL HIGH (ref 8–23)
CO2: 23 mmol/L (ref 22–32)
Calcium: 10.5 mg/dL — ABNORMAL HIGH (ref 8.9–10.3)
Chloride: 103 mmol/L (ref 98–111)
Creatinine, Ser: 1.62 mg/dL — ABNORMAL HIGH (ref 0.44–1.00)
GFR, Estimated: 32 mL/min — ABNORMAL LOW (ref >60)
Glucose, Bld: 136 mg/dL — ABNORMAL HIGH (ref 70–99)
Potassium: 3.8 mmol/L (ref 3.5–5.1)
Sodium: 136 mmol/L (ref 135–145)

## 2021-01-05 LAB — PROTIME-INR
INR: 1.4 — ABNORMAL HIGH (ref 0.8–1.2)
Prothrombin Time: 17 seconds — ABNORMAL HIGH (ref 11.4–15.2)

## 2021-01-05 LAB — RESP PANEL BY RT-PCR (FLU A&B, COVID) ARPGX2
Influenza A by PCR: NEGATIVE
Influenza B by PCR: NEGATIVE
SARS Coronavirus 2 by RT PCR: NEGATIVE

## 2021-01-05 MED ORDER — HYDRALAZINE HCL 20 MG/ML IJ SOLN
10.0000 mg | INTRAMUSCULAR | Status: DC | PRN
  Administered 2021-01-05: 10 mg via INTRAVENOUS
  Filled 2021-01-05: qty 1

## 2021-01-05 MED ORDER — SODIUM CHLORIDE 0.9 % IV SOLN: INTRAVENOUS | Status: DC

## 2021-01-05 MED ORDER — LABETALOL HCL 5 MG/ML IV SOLN
10.0000 mg | INTRAVENOUS | Status: DC | PRN
  Administered 2021-01-05 – 2021-01-11 (×4): 10 mg via INTRAVENOUS
  Filled 2021-01-05 (×4): qty 4

## 2021-01-05 MED ORDER — WARFARIN - PHARMACIST DOSING INPATIENT: Freq: Every day | Status: DC

## 2021-01-05 MED ORDER — LEVOTHYROXINE SODIUM 125 MCG PO TABS
125.0000 ug | ORAL_TABLET | Freq: Every day | ORAL | Status: DC
  Administered 2021-01-06 – 2021-01-12 (×6): 125 ug via ORAL
  Filled 2021-01-05 (×6): qty 1

## 2021-01-05 MED ORDER — ACETAMINOPHEN 325 MG PO TABS
650.0000 mg | ORAL_TABLET | Freq: Four times a day (QID) | ORAL | Status: DC | PRN
  Administered 2021-01-05 – 2021-01-11 (×8): 650 mg via ORAL
  Filled 2021-01-05 (×8): qty 2

## 2021-01-05 MED ORDER — ACETAMINOPHEN 650 MG RE SUPP: 650.0000 mg | Freq: Four times a day (QID) | RECTAL | Status: DC | PRN

## 2021-01-05 MED ORDER — ORAL CARE MOUTH RINSE
15.0000 mL | Freq: Two times a day (BID) | OROMUCOSAL | Status: DC
  Administered 2021-01-05 – 2021-01-11 (×11): 15 mL via OROMUCOSAL

## 2021-01-05 MED ORDER — SODIUM CHLORIDE 0.9 % IV BOLUS
1000.0000 mL | Freq: Once | INTRAVENOUS | Status: AC
  Administered 2021-01-05: 1000 mL via INTRAVENOUS

## 2021-01-05 MED ORDER — HYDRALAZINE HCL 25 MG PO TABS
25.0000 mg | ORAL_TABLET | ORAL | Status: DC | PRN
  Filled 2021-01-05: qty 1

## 2021-01-05 MED ORDER — SODIUM CHLORIDE 0.9 % IV SOLN
2.0000 g | INTRAVENOUS | Status: DC
  Administered 2021-01-05 – 2021-01-11 (×7): 2 g via INTRAVENOUS
  Filled 2021-01-05: qty 20
  Filled 2021-01-05: qty 2
  Filled 2021-01-05: qty 20
  Filled 2021-01-05: qty 2
  Filled 2021-01-05 (×2): qty 20
  Filled 2021-01-05: qty 2

## 2021-01-05 MED ORDER — WARFARIN SODIUM 5 MG PO TABS
7.5000 mg | ORAL_TABLET | Freq: Once | ORAL | Status: AC
  Administered 2021-01-05: 7.5 mg via ORAL
  Filled 2021-01-05: qty 1

## 2021-01-05 NOTE — Progress Notes (Signed)
ANTICOAGULATION CONSULT NOTE - Initial Consult  Pharmacy Consult for Warfarin Indication: DVT  Allergies  Allergen Reactions   Anesthetics, Amide Hypertension   Benadryl [Diphenhydramine Hcl] Other (See Comments)    Dizziness   Carbocaine [Mepivacaine Hcl] Hypertension   Codeine Other (See Comments)    Dizziness   Epinephrine Hypertension   Sulfa Antibiotics Other (See Comments)    dizziness   Diphenhydramine    Latex Other (See Comments)    Blisters in mouth   Penicillins Rash    Has patient had a PCN reaction causing immediate rash, facial/tongue/throat swelling, SOB or lightheadedness with hypotension: Unknown Has patient had a PCN reaction causing severe rash involving mucus membranes or skin necrosis: Unknown Has patient had a PCN reaction that required hospitalization: Unknown Has patient had a PCN reaction occurring within the last 10 years: Unknown If all of the above answers are "NO", then may proceed with Cephalosporin use.    Tramadol Other (See Comments)    Sedation.     Patient Measurements: Height: '5\' 4"'$  (162.6 cm) Weight: 99.5 kg (219 lb 5.7 oz) IBW/kg (Calculated) : 54.7    Vital Signs: Temp: 98.7 F (37.1 C) (08/09 0300) Temp Source: Oral (08/09 0300) BP: 174/91 (08/09 0300) Pulse Rate: 103 (08/09 0300)  Labs: Recent Labs    01/04/21 2330  HGB 13.6  HCT 42.3  PLT 211  CREATININE 1.72*    Estimated Creatinine Clearance: 29.4 mL/min (A) (by C-G formula based on SCr of 1.72 mg/dL (H)).   Medical History: Past Medical History:  Diagnosis Date   Allergy    Anxiety    Arthritis    Blood transfusion without reported diagnosis    Breast cancer ([place]) 07/12/2013   Invasive Mammary Carcinoma   DVT (deep vein thrombosis) in pregnancy    Hypertension    Hypothyroid    Metastatic cancer to bone [facility]) dx'd 12/2016   hip   Personal history of radiation therapy    Pneumonia    Radiation 11/21/13-01/07/14   Right Breast/Supraclavicular     Medications:  PTA Warfarin '5mg'$  daily except 7.'5mg'$  MWF  Assessment: 80 yr female brought to ED s/p fall.  Urinalysis shows infection; AKI PMH significant for DVT, breast cancer, metastatic bone cancer, HTN On Warfarin PTA with last dose taken 01/04/21  Goal of Therapy:  INR 2-3    Plan:  No warfarin at this time Daily PT/INR F/U 8/9 AM INR and dose warfarin later today  Wandra Babin, Toribio Harbour, PharmD 01/05/2021,3:22 AM

## 2021-01-05 NOTE — Evaluation (Signed)
Physical Therapy Evaluation Patient Details Name: Alicia Vasquez [facility] MRN: [id number] DOB: [birth year] Today's Date: 01/05/2021   History of Present Illness  80 yo female admitted with ARF, fall. Hx of met breast cancer, DVT, L TKA 2018, L hip replacement  Clinical Impression  Bed level eval only. Pt c/o not feeling well/nausea and significant pain in bil LEs. Limited LE assessment 2* pain. She did not feel up to mobilizing at this time. No family present during session. Recommendation is for ST SNF. Will follow and progress activity as able.     Follow Up Recommendations SNF    Equipment Recommendations  None recommended by PT    Recommendations for Other Services OT consult     Precautions / Restrictions Precautions Precautions: Fall Restrictions Weight Bearing Restrictions: No      Mobility  Bed Mobility               General bed mobility comments: Unable to attempt on today-pt declined 2* "feeling bad"-c/o nausea and pain    Transfers                    Ambulation/Gait                Stairs            Wheelchair Mobility    Modified Rankin (Stroke Patients Only)       Balance Overall balance assessment: History of Falls                                           Pertinent Vitals/Pain Pain Assessment: Faces Faces Pain Scale: Hurts whole lot Pain Location: pain with assessment of LEs Pain Descriptors / Indicators: Grimacing;Moaning;Guarding Pain Intervention(s): Limited activity within patient's tolerance    Home Living Family/patient expects to be discharged to:: Private residence Living Arrangements: Non-relatives/Friends Available Help at Discharge: Friend(s) Type of Home: House Home Access: (P) Stairs to enter Entrance Stairs-Rails: None Entrance Stairs-Number of Steps: 1 Home Layout: One level Home Equipment: Walker - 2 wheels;Wheelchair - manual      Prior Function           Comments: uses RW  for short household distances. Otherwise, uses wheelchair     Hand Dominance        Extremity/Trunk Assessment   Upper Extremity Assessment Upper Extremity Assessment: Defer to OT evaluation    Lower Extremity Assessment Lower Extremity Assessment: Generalized weakness (unable to accurately assess due to pain)       Communication   Communication: HOH  Cognition Arousal/Alertness: Awake/alert Behavior During Therapy: WFL for tasks assessed/performed Overall Cognitive Status: Difficult to assess                                 General Comments: seemed to answer PLOF quesetions properly      General Comments      Exercises     Assessment/Plan    PT Assessment Patient needs continued PT services  PT Problem List Decreased strength;Decreased mobility;Decreased range of motion;Decreased activity tolerance;Decreased balance;Decreased knowledge of use of DME;Pain       PT Treatment Interventions DME instruction;Gait training;Therapeutic exercise;Balance training;Functional mobility training;Therapeutic activities;Patient/family education    PT Goals (Current goals can be found in the Care Plan section)  Acute Rehab PT Goals Patient Stated Goal: none  stated PT Goal Formulation: With patient Time For Goal Achievement: 01/19/21 Potential to Achieve Goals: Good    Frequency Min 2X/week   Barriers to discharge Decreased caregiver support      Co-evaluation               AM-PAC PT "6 Clicks" Mobility  Outcome Measure Help needed turning from your back to your side while in a flat bed without using bedrails?: Total Help needed moving from lying on your back to sitting on the side of a flat bed without using bedrails?: Total Help needed moving to and from a bed to a chair (including a wheelchair)?: Total Help needed standing up from a chair using your arms (e.g., wheelchair or bedside chair)?: Total Help needed to walk in hospital room?: Total Help  needed climbing 3-5 steps with a railing? : Total 6 Click Score: 6    End of Session   Activity Tolerance: Patient limited by pain Patient left: in bed;with call bell/phone within reach;with bed alarm set   PT Visit Diagnosis: Muscle weakness (generalized) (M62.81);Pain;Difficulty in walking, not elsewhere classified (R26.2);History of falling (Z91.81)    Time: 8478-4128 PT Time Calculation (min) (ACUTE ONLY): 10 min   Charges:   PT Evaluation $PT Eval Moderate Complexity: 1 Mod             Doreatha Massed, PT Acute Rehabilitation  Office: [phone] Pager: [phone]

## 2021-01-05 NOTE — Plan of Care (Signed)

## 2021-01-05 NOTE — Plan of Care (Signed)
Admitted after midnight. Patient seen and rounded on this morning. Chart reviewed.  80 yo female with PMH Stage IV breast cancer, hx DVT LE (on Coumadin), hypothyroidism, hypoparathyroidism who presented to the ER after a fall due to weakness. She had been reportedly treated for a UTI outpatient but continued to have extremely poor appetite with associated nausea/vomiting and weakness. After work-up she was found to have AKI and urinalysis was consistent with UTI.  Previous urine culture in July grew Proteus.  She was started on Rocephin and IVF.   No change in A&P. See H&P for full details. Creatinine responding to IVF. Follow up urine culture.  Still confused; will have to see how mentation improves over next 24 hours.   Dwyane Dee, MD [facility] 01/05/2021, 12:11 PM No charge

## 2021-01-05 NOTE — Progress Notes (Signed)
ANTICOAGULATION CONSULT NOTE -   Pharmacy Consult for Warfarin Indication: DVT  Allergies  Allergen Reactions   Anesthetics, Amide Hypertension   Benadryl [Diphenhydramine Hcl] Other (See Comments)    Dizziness   Carbocaine [Mepivacaine Hcl] Hypertension   Codeine Other (See Comments)    Dizziness   Epinephrine Hypertension   Sulfa Antibiotics Other (See Comments)    dizziness   Diphenhydramine    Latex Other (See Comments)    Blisters in mouth   Penicillins Rash    Has patient had a PCN reaction causing immediate rash, facial/tongue/throat swelling, SOB or lightheadedness with hypotension: Unknown Has patient had a PCN reaction causing severe rash involving mucus membranes or skin necrosis: Unknown Has patient had a PCN reaction that required hospitalization: Unknown Has patient had a PCN reaction occurring within the last 10 years: Unknown If all of the above answers are "NO", then may proceed with Cephalosporin use.    Tramadol Other (See Comments)    Sedation.     Patient Measurements: Height: '5\' 4"'$  (162.6 cm) Weight: 99.5 kg (219 lb 5.7 oz) IBW/kg (Calculated) : 54.7    Vital Signs: Temp: 98.5 F (36.9 C) (08/09 0746) Temp Source: Oral (08/09 0746) BP: 159/61 (08/09 0746) Pulse Rate: 101 (08/09 0746)  Labs: Recent Labs    01/04/21 2330 01/05/21 0535  HGB 13.6 12.7  HCT 42.3 38.6  PLT 211 187  LABPROT  --  17.0*  INR  --  1.4*  CREATININE 1.72* 1.62*     Estimated Creatinine Clearance: 31.2 mL/min (A) (by C-G formula based on SCr of 1.62 mg/dL (H)).   Medications:  PTA Warfarin '5mg'$  daily except 7.'5mg'$  MWF- dosed by Velora Heckler PCP per med rec but Rx sent by Dr Jana Hakim of Onc  Assessment: 80 yr female brought to ED s/p fall.  Urinalysis shows infection; AKI PMH significant for DVT, breast cancer, metastatic bone cancer, HTN On Warfarin PTA with last dose taken 01/04/21 = 7.5 mg per med rec/son but per ED RN note pt stated she did not take it 8/7 or  8/8  01/05/2021  INR 1.4  subtherapeutic. CBC WNL, No bleeding reported. Per RN admit note pt does have small hematoma on R side of head from fall.  Per RN note 8/8 pt did not take "eliquis" today or yesterday> so this may be why INR is low. Pt not on eliquis but on warfarin PTA.    Goal of Therapy:  INR 2-3    Plan:  Warfarin 7.5 mg po x 1 dose today Daily PT/INR  Eudelia Bunch, Pharm.D 01/05/2021 8:45 AM

## 2021-01-05 NOTE — H&P (Signed)
History and Physical    Alicia Vasquez [id number] DOB: 1941/03/24 DOA: 01/04/2021  PCP: [name], MD  Patient coming from: Home.  Chief Complaint: Fall.  Weakness.  HPI: Alicia Vasquez is a 80 y.o. 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  Patient states she was recently treated for urinary tract infection with antibiotics.  Over the last 2 days patient states he has been feeling weak and had episodes of nausea vomiting aspirin and poor appetite.  When she tried to get out of her wheelchair to use a walker she tripped and fell and hit her head.  Did not lose consciousness.  Was brought to the ER.  Denies chest pain or shortness of breath.  Has been feeling generally weak.  ED Course: In the ER CT head C-spine x-rays of the chest and x-ray pelvis was unremarkable.  Lab work showed acute renal failure with creatinine worsening from being normal in July 28 about 10 days ago it is around 1.7 now.  Calcium has been consistently high due to hyperparathyroidism.  WBC count is 16,000 UA is consistent with UTI.  Urine culture on July 20 is growing Proteus mirabilis which is sensitive to ceftriaxone.  Patient was given fluid bolus and started on ceftriaxone.  Admitted for further management of acute renal failure and UTI.  Review of Systems: As per HPI, rest all negative.   Past Medical History:  Diagnosis Date   Allergy    Anxiety    Arthritis    Blood transfusion without reported diagnosis    Breast cancer ([place]) 07/12/2013   Invasive Mammary Carcinoma   DVT (deep vein thrombosis) in pregnancy    Hypertension    Hypothyroid    Metastatic cancer to bone ([place]) dx'd 12/2016   hip   Personal history of radiation therapy    Pneumonia    Radiation 11/21/13-01/07/14   Right Breast/Supraclavicular     Past Surgical History:  Procedure Laterality Date   BREAST LUMPECTOMY Left 2015   BREAST LUMPECTOMY WITH RADIOACTIVE SEED LOCALIZATION Right 09/09/2013   Procedure: BREAST LUMPECTOMY WITH RADIOACTIVE SEED LOCALIZATION WITH AXILLARY NODE EXCISION;  Surgeon: [name], MD;  Location: [facility];  Service: General;  Laterality: Right;   DENTAL SURGERY  04/19/2012   13 TEETH REMOVED   DILATION AND CURETTAGE OF UTERUS     ORIF PERIPROSTHETIC FRACTURE Left 01/31/2017   Procedure: REVISION and OPEN REDUCTION INTERNAL FIXATION (ORIF) PERIPROSTHETIC FRACTURE LEFT HIP;  Surgeon: Paralee Cancel, MD;  Location: [facility];  Service: Orthopedics;  Laterality: Left;  120 mins   RE-EXCISION OF BREAST LUMPECTOMY Right 09/24/2013   Procedure: RE-EXCISION OF RIGHT BREAST LUMPECTOMY;  Surgeon: [name], MD;  Location: [facility];  Service: General;  Laterality: Right;   TOTAL HIP ARTHROPLASTY Left 01/16/2017   Procedure: TOTAL HIP ARTHROPLASTY POSTERIOR;  Surgeon: Paralee Cancel, MD;  Location: [facility];  Service: Orthopedics;  Laterality: Left;     reports that she has never smoked. She has never used smokeless tobacco. She reports that she does not drink alcohol and does not use drugs.  Allergies  Allergen Reactions   Anesthetics, Amide Hypertension   Benadryl [Diphenhydramine Hcl] Other (See Comments)    Dizziness   Carbocaine [Mepivacaine Hcl] Hypertension   Codeine Other (See Comments)    Dizziness   Epinephrine  Hypertension   Sulfa Antibiotics Other (See Comments)    dizziness   Diphenhydramine    Latex Other (See Comments)    Blisters in mouth   Penicillins Rash    Has patient had a PCN reaction causing immediate rash, facial/tongue/throat swelling, SOB or lightheadedness with hypotension: Unknown Has patient had a PCN reaction causing severe rash involving mucus membranes or skin necrosis: Unknown Has patient had a PCN reaction that required  hospitalization: Unknown Has patient had a PCN reaction occurring within the last 10 years: Unknown If all of the above answers are "NO", then may proceed with Cephalosporin use.    Tramadol Other (See Comments)    Sedation.     Family History  Problem Relation Age of Onset   Heart disease Brother    Colon cancer Brother    Prostate cancer Brother     Prior to Admission medications   Medication Sig Start Date End Date Taking? Authorizing Provider  B-D TB SYRINGE 1CC/27GX1/2" 27G X 1/2" 1 ML MISC USE TO DRAW UP 1 ML LIDOCAINE AND INJECT INTO CEFTRIAXONE VIAL FOR DILUTION. CHANGE TO 25G NEEDLE FOR ADMINISTRATION 02/20/20  Yes [provider]  Cholecalciferol (VITAMIN D3) 5000 UNITS TABS Take 5,000 Units by mouth daily.    Yes [provider]  clotrimazole-betamethasone (LOTRISONE) lotion Apply topically 2 (two) times daily. 07/27/20  Yes Tanner, Lyndon Code., PA-C  HYDROcodone-acetaminophen (NORCO/VICODIN) 5-325 MG tablet Take 1 tablet by mouth every 6 (six) hours as needed for moderate pain. 11/17/20  Yes Magrinat, [name], MD  ketoconazole (NIZORAL) 2 % cream Apply 1 application topically daily. 12/27/18  Yes Gardenia Phlegm, NP  NEEDLE, DISP, 25 G 25G X 1-1/2" MISC Please use to administer rocephin injection 02/20/20  Yes Leamon Arnt, MD  SYNTHROID 125 MCG tablet TAKE 1 TABLET BY [place] Patient taking differently: Take 125 mcg by mouth daily before breakfast. 01/01/20  Yes Magrinat, [name], MD  Syringe, Disposable, 5 ML MISC Please use to inject rocephin injection 02/20/20  Yes Leamon Arnt, MD  vitamin C (ASCORBIC ACID) 250 MG tablet Take 250 mg by mouth 3 (three) times daily.   Yes [provider]  warfarin (COUMADIN) 5 MG tablet Take 7.5 mg daily. Patient taking differently: Take 5-7.5 mg by mouth See admin instructions. Take 7.5 mg Mon, Wed, Fri. And take 5 mg Cain Saupe, Sat, Sun 03/24/20  Yes Magrinat, [name], MD     Physical Exam: Constitutional: Moderately built and nourished. Vitals:   01/04/21 2217 01/04/21 2336 01/05/21 0000 01/05/21 0045  BP:  (!) 145/66 (!) 169/95 (!) 147/131  Pulse:  98 (!) 114 90  Resp:  18 (!) 29 (!) 42  Temp:      TempSrc:      SpO2:  95% 99% 96%  Weight: 102.1 kg     Height: '5\' 4"'$  (1.626 m)      Eyes: Anicteric no pallor. ENMT: No discharge from the ears eyes nose and mouth. Neck: No mass felt.  No neck rigidity. Respiratory: No rhonchi or crepitations. Cardiovascular: S1-S2 heard. Abdomen: Soft nontender bowel sounds present. Musculoskeletal: Does not move her left lower extremity due to chronic pain of the left hip. Skin: Chronic skin changes. Neurologic: Alert awake oriented to time place and person.  Moves all extremities. Psychiatric: Appears normal.  Normal affect.   Labs on Admission: I have personally reviewed following labs and imaging studies  CBC: Recent Labs  Lab 01/04/21  2330  WBC 16.3*  NEUTROABS 13.2*  HGB 13.6  HCT 42.3  MCV 88.9  PLT [id number]   Basic Metabolic Panel: Recent Labs  Lab 01/04/21 2330  NA 137  K 4.4  CL 101  CO2 26  GLUCOSE 128*  BUN 36*  CREATININE 1.72*  CALCIUM 10.8*   GFR: Estimated Creatinine Clearance: 29.8 mL/min (A) (by C-G formula based on SCr of 1.72 mg/dL (H)). Liver Function Tests: Recent Labs  Lab 01/04/21 2330  AST 20  ALT 20  ALKPHOS 91  BILITOT 1.6*  PROT 7.3  ALBUMIN 3.8   No results for input(s): LIPASE, AMYLASE in the last 168 hours. No results for input(s): AMMONIA in the last 168 hours. Coagulation Profile: No results for input(s): INR, PROTIME in the last 168 hours. Cardiac Enzymes: No results for input(s): CKTOTAL, CKMB, CKMBINDEX, TROPONINI in the last 168 hours. BNP (last 3 results) No results for input(s): PROBNP in the last 8760 hours. HbA1C: No results for input(s): HGBA1C in the last 72 hours. CBG: No results for input(s): GLUCAP in the last 168 hours. Lipid  Profile: No results for input(s): CHOL, HDL, LDLCALC, TRIG, CHOLHDL, LDLDIRECT in the last 72 hours. Thyroid Function Tests: No results for input(s): TSH, T4TOTAL, FREET4, T3FREE, THYROIDAB in the last 72 hours. Anemia Panel: No results for input(s): VITAMINB12, FOLATE, FERRITIN, TIBC, IRON, RETICCTPCT in the last 72 hours. Urine analysis:    Component Value Date/Time   COLORURINE YELLOW 01/05/2021 0030   APPEARANCEUR HAZY (A) 01/05/2021 0030   LABSPEC 1.024 01/05/2021 0030   LABSPEC 1.010 09/22/2016 1553   PHURINE 5.0 01/05/2021 0030   GLUCOSEU NEGATIVE 01/05/2021 0030   GLUCOSEU Negative 09/22/2016 1553   HGBUR NEGATIVE 01/05/2021 0030   BILIRUBINUR NEGATIVE 01/05/2021 0030   BILIRUBINUR neg 12/16/2020 1116   BILIRUBINUR Negative 09/22/2016 1553   KETONESUR 5 (A) 01/05/2021 0030   PROTEINUR 100 (A) 01/05/2021 0030   UROBILINOGEN negative (A) 12/16/2020 1116   UROBILINOGEN 0.2 11/15/2016 1358   UROBILINOGEN 0.2 09/22/2016 1553   NITRITE NEGATIVE 01/05/2021 0030   LEUKOCYTESUR LARGE (A) 01/05/2021 0030   LEUKOCYTESUR Negative 09/22/2016 1553   Sepsis Labs: '@LABRCNTIP'$ (procalcitonin:4,lacticidven:4) ) Recent Results (from the past 240 hour(s))  Resp Panel by RT-PCR (Flu A&B, Covid) Nasopharyngeal Swab     Status: None   Collection Time: 01/04/21 11:30 PM   Specimen: Nasopharyngeal Swab; Nasopharyngeal(NP) swabs in vial transport medium  Result Value Ref Range Status   SARS Coronavirus 2 by RT PCR NEGATIVE NEGATIVE Final    Comment: (NOTE) SARS-CoV-2 target nucleic acids are NOT DETECTED.  The SARS-CoV-2 RNA is generally detectable in upper respiratory specimens during the acute phase of infection. The lowest concentration of SARS-CoV-2 viral copies this assay can detect is 138 copies/mL. A negative result does not preclude SARS-Cov-2 infection and should not be used as the sole basis for treatment or other patient management decisions. A negative result may occur with   improper specimen collection/handling, submission of specimen other than nasopharyngeal swab, presence of viral mutation(s) within the areas targeted by this assay, and inadequate number of viral copies(<138 copies/mL). A negative result must be combined with clinical observations, patient history, and epidemiological information. The expected result is Negative.  Fact Sheet for Patients:  EntrepreneurPulse.com.au  Fact Sheet for Healthcare Providers:  IncredibleEmployment.be  This test is no t yet approved or cleared by the [place] FDA and  has been authorized for detection and/or diagnosis of SARS-CoV-2 by FDA under an Emergency Use Authorization (  EUA). This EUA will remain  in effect (meaning this test can be used) for the duration of the COVID-19 declaration under Section 564(b)(1) of the Act, 21 U.S.C.section 360bbb-3(b)(1), unless the authorization is terminated  or revoked sooner.       Influenza A by PCR NEGATIVE NEGATIVE Final   Influenza B by PCR NEGATIVE NEGATIVE Final    Comment: (NOTE) The Xpert Xpress SARS-CoV-2/FLU/RSV plus assay is intended as an aid in the diagnosis of influenza from Nasopharyngeal swab specimens and should not be used as a sole basis for treatment. Nasal washings and aspirates are unacceptable for Xpert Xpress SARS-CoV-2/FLU/RSV testing.  Fact Sheet for Patients: EntrepreneurPulse.com.au  Fact Sheet for Healthcare Providers: IncredibleEmployment.be  This test is not yet approved or cleared by the [place] FDA and has been authorized for detection and/or diagnosis of SARS-CoV-2 by FDA under an Emergency Use Authorization (EUA). This EUA will remain in effect (meaning this test can be used) for the duration of the COVID-19 declaration under Section 564(b)(1) of the Act, 21 U.S.C. section 360bbb-3(b)(1), unless the authorization is terminated  or revoked.  Performed at [facility], [place] [address], [place], [place] [address]      Radiological Exams on Admission: DG Chest 2 View  Result Date: 01/04/2021 CLINICAL DATA:  Pain, mechanical fall EXAM: CHEST - 2 VIEW COMPARISON:  01/16/2017 FINDINGS: The heart size and mediastinal contours are within normal limits. Both lungs are clear. The visualized skeletal structures are unremarkable. IMPRESSION: No active cardiopulmonary disease. Electronically Signed   By: Donavan Foil M.D.   On: 01/04/2021 23:31   DG Pelvis 1-2 Views  Result Date: 01/04/2021 CLINICAL DATA:  Pain after fall EXAM: PELVIS - 1-2 VIEW COMPARISON:  06/04/2020, 02/01/2017 FINDINGS: Left hip replacement with intact hardware and normal alignment. No definite acute displaced fracture is seen. Pubic symphysis and rami appear intact. Mild periprosthetic lucency at the left trochanter. IMPRESSION: Left hip replacement without acute osseous abnormality Electronically Signed   By: Donavan Foil M.D.   On: 01/04/2021 23:33   CT Head Wo Contrast  Result Date: 01/04/2021 CLINICAL DATA:  [place] out of wheelchair EXAM: CT HEAD WITHOUT CONTRAST CT CERVICAL SPINE WITHOUT CONTRAST TECHNIQUE: Multidetector CT imaging of the head and cervical spine was performed following the standard protocol without intravenous contrast. Multiplanar CT image reconstructions of the cervical spine were also generated. COMPARISON:  CT brain and cervical spine 09/25/2015 . FINDINGS: CT HEAD FINDINGS Brain: no acute territorial infarction, hemorrhage, or intracranial mass. Interval encephalomalacia at the right frontal lobe. Mild atrophy and chronic small vessel ischemic changes of the white matter Vascular: No hyperdense vessels.  Carotid vascular calcification Skull: Normal. Negative for fracture or focal lesion. Sinuses/Orbits: No acute finding. Other: None CT CERVICAL SPINE FINDINGS Alignment: No subluxation.  Facet alignment within normal  limits. Skull base and vertebrae: No acute fracture. No primary bone lesion or focal pathologic process. Incomplete fusion left posterior arch of C1 Soft tissues and spinal canal: No prevertebral fluid or swelling. No visible canal hematoma. Disc levels: Moderate severe degenerative changes C4-C5 and C5-C6. Facet degenerative changes at multiple levels with foraminal narrowing. Upper chest: Negative. Other: None IMPRESSION: 1. No CT evidence for acute intracranial abnormality. Interval right frontal lobe encephalomalacia. Mild atrophy and chronic small vessel ischemic changes of the white matter 2. Degenerative changes of the cervical spine. No acute osseous abnormality Electronically Signed   By: Donavan Foil M.D.   On: 01/04/2021 23:24   CT Cervical  Spine Wo Contrast  Result Date: 01/04/2021 CLINICAL DATA:  [place] out of wheelchair EXAM: CT HEAD WITHOUT CONTRAST CT CERVICAL SPINE WITHOUT CONTRAST TECHNIQUE: Multidetector CT imaging of the head and cervical spine was performed following the standard protocol without intravenous contrast. Multiplanar CT image reconstructions of the cervical spine were also generated. COMPARISON:  CT brain and cervical spine 09/25/2015 . FINDINGS: CT HEAD FINDINGS Brain: no acute territorial infarction, hemorrhage, or intracranial mass. Interval encephalomalacia at the right frontal lobe. Mild atrophy and chronic small vessel ischemic changes of the white matter Vascular: No hyperdense vessels.  Carotid vascular calcification Skull: Normal. Negative for fracture or focal lesion. Sinuses/Orbits: No acute finding. Other: None CT CERVICAL SPINE FINDINGS Alignment: No subluxation.  Facet alignment within normal limits. Skull base and vertebrae: No acute fracture. No primary bone lesion or focal pathologic process. Incomplete fusion left posterior arch of C1 Soft tissues and spinal canal: No prevertebral fluid or swelling. No visible canal hematoma. Disc levels: Moderate severe  degenerative changes C4-C5 and C5-C6. Facet degenerative changes at multiple levels with foraminal narrowing. Upper chest: Negative. Other: None IMPRESSION: 1. No CT evidence for acute intracranial abnormality. Interval right frontal lobe encephalomalacia. Mild atrophy and chronic small vessel ischemic changes of the white matter 2. Degenerative changes of the cervical spine. No acute osseous abnormality Electronically Signed   By: Donavan Foil M.D.   On: 01/04/2021 23:24    EKG: Independently reviewed.  Sinus tachycardia.  Assessment/Plan Principal Problem:   ARF (acute renal failure) (HCC) Active Problems:   Hypothyroid   HTN (hypertension)   Primary hypercoagulable state ([place])   DVT, lower extremity ([place])    Acute renal failure likely from poor oral intake and nausea vomiting.  Gently hydrate follow metabolic panel.  Since patient has persistent nausea and poor oral intake with persistent UTI will check CT renal studies. UTI on ceftriaxone.  Urine cultures on July 20th 2020 grew Proteus mirabilis which is sensitive to ceftriaxone.  Checking CT renal study to make sure there is no any intra-abdominal process. Elevated blood pressure readings presently not on any antihypertensives.  We will keep patient on as needed IV hydralazine for blood pressure trends. History of lower extremity DVT on Coumadin which will be dosed per pharmacy. Hypothyroidism on Synthroid. History of hyperparathyroidism being followed by endocrinologist. Metastatic breast cancer being followed by oncologist.   DVT prophylaxis: Coumadin. Code Status: Full code. Family Communication: Discussed with patient. Disposition Plan: To be determined. Consults called: Physical therapy. Admission status: Observation.   Rise Patience MD [facility] Pager [phone].  If 7PM-7AM, please contact night-coverage [email or web address] Password [email or web address]  01/05/2021, 2:11 AM

## 2021-01-05 NOTE — ED Provider Notes (Signed)
  Provider Note MRN:  [id number]  Arrival date & time: 01/05/21    ED Course and Medical Decision Making  Assumed care from Dr. Eulis Foster at shift change.  Fall, weakness, poor p.o. intake, trauma imaging reassuring, patient has AKI, awaiting urinalysis and will reassess.  Urinalysis showing evidence of infection.  Given this and the AKI in the leukocytosis and the continued tachycardia, will admit to medicine.  Providing ceftriaxone, IV fluids.  Procedures  Final Clinical Impressions(s) / ED Diagnoses     ICD-10-CM   1. Fall, initial encounter  W19.XXXA     2. Contusion, multiple sites  T07.XXXA     3. Anorexia  R63.0     4. Current use of long term anticoagulation  Z79.01     5. AKI (acute kidney injury) (HCC)  N17.9     6. Acute cystitis without hematuria  N30.00       ED Discharge Orders     None       Discharge Instructions   None     Barth Kirks. [place]onia Small, [place] [email or web address]    Maudie Flakes, MD 01/05/21 [phone]

## 2021-01-06 ENCOUNTER — Inpatient Hospital Stay (HOSPITAL_COMMUNITY): Payer: Medicare Other

## 2021-01-06 DIAGNOSIS — R0602 Shortness of breath: Secondary | ICD-10-CM | POA: Diagnosis not present

## 2021-01-06 DIAGNOSIS — N202 Calculus of kidney with calculus of ureter: Secondary | ICD-10-CM | POA: Diagnosis present

## 2021-01-06 DIAGNOSIS — E785 Hyperlipidemia, unspecified: Secondary | ICD-10-CM | POA: Diagnosis not present

## 2021-01-06 DIAGNOSIS — N179 Acute kidney failure, unspecified: Secondary | ICD-10-CM | POA: Diagnosis present

## 2021-01-06 DIAGNOSIS — Z880 Allergy status to penicillin: Secondary | ICD-10-CM | POA: Diagnosis not present

## 2021-01-06 DIAGNOSIS — Z86718 Personal history of other venous thrombosis and embolism: Secondary | ICD-10-CM | POA: Diagnosis not present

## 2021-01-06 DIAGNOSIS — Z6841 Body Mass Index (BMI) 40.0 and over, adult: Secondary | ICD-10-CM | POA: Diagnosis not present

## 2021-01-06 DIAGNOSIS — E559 Vitamin D deficiency, unspecified: Secondary | ICD-10-CM | POA: Diagnosis not present

## 2021-01-06 DIAGNOSIS — E876 Hypokalemia: Secondary | ICD-10-CM | POA: Diagnosis present

## 2021-01-06 DIAGNOSIS — R630 Anorexia: Secondary | ICD-10-CM | POA: Diagnosis not present

## 2021-01-06 DIAGNOSIS — E213 Hyperparathyroidism, unspecified: Secondary | ICD-10-CM | POA: Diagnosis present

## 2021-01-06 DIAGNOSIS — Z7401 Bed confinement status: Secondary | ICD-10-CM | POA: Diagnosis not present

## 2021-01-06 DIAGNOSIS — N390 Urinary tract infection, site not specified: Secondary | ICD-10-CM | POA: Diagnosis not present

## 2021-01-06 DIAGNOSIS — R5383 Other fatigue: Secondary | ICD-10-CM | POA: Diagnosis not present

## 2021-01-06 DIAGNOSIS — I10 Essential (primary) hypertension: Secondary | ICD-10-CM | POA: Diagnosis present

## 2021-01-06 DIAGNOSIS — E860 Dehydration: Secondary | ICD-10-CM | POA: Diagnosis present

## 2021-01-06 DIAGNOSIS — E039 Hypothyroidism, unspecified: Secondary | ICD-10-CM | POA: Diagnosis present

## 2021-01-06 DIAGNOSIS — Z800 Family history of malignant neoplasm of digestive organs: Secondary | ICD-10-CM | POA: Diagnosis not present

## 2021-01-06 DIAGNOSIS — Z96642 Presence of left artificial hip joint: Secondary | ICD-10-CM | POA: Diagnosis present

## 2021-01-06 DIAGNOSIS — R278 Other lack of coordination: Secondary | ICD-10-CM | POA: Diagnosis not present

## 2021-01-06 DIAGNOSIS — C7951 Secondary malignant neoplasm of bone: Secondary | ICD-10-CM | POA: Diagnosis present

## 2021-01-06 DIAGNOSIS — Z20822 Contact with and (suspected) exposure to covid-19: Secondary | ICD-10-CM | POA: Diagnosis present

## 2021-01-06 DIAGNOSIS — S32000D Wedge compression fracture of unspecified lumbar vertebra, subsequent encounter for fracture with routine healing: Secondary | ICD-10-CM | POA: Diagnosis not present

## 2021-01-06 DIAGNOSIS — Z853 Personal history of malignant neoplasm of breast: Secondary | ICD-10-CM | POA: Diagnosis not present

## 2021-01-06 DIAGNOSIS — Z923 Personal history of irradiation: Secondary | ICD-10-CM | POA: Diagnosis not present

## 2021-01-06 DIAGNOSIS — M6281 Muscle weakness (generalized): Secondary | ICD-10-CM | POA: Diagnosis not present

## 2021-01-06 DIAGNOSIS — R2681 Unsteadiness on feet: Secondary | ICD-10-CM | POA: Diagnosis not present

## 2021-01-06 DIAGNOSIS — M545 Low back pain, unspecified: Secondary | ICD-10-CM | POA: Diagnosis not present

## 2021-01-06 DIAGNOSIS — M4856XA Collapsed vertebra, not elsewhere classified, lumbar region, initial encounter for fracture: Secondary | ICD-10-CM | POA: Diagnosis present

## 2021-01-06 DIAGNOSIS — R5381 Other malaise: Secondary | ICD-10-CM | POA: Diagnosis not present

## 2021-01-06 DIAGNOSIS — D649 Anemia, unspecified: Secondary | ICD-10-CM | POA: Diagnosis present

## 2021-01-06 DIAGNOSIS — N201 Calculus of ureter: Secondary | ICD-10-CM | POA: Diagnosis not present

## 2021-01-06 DIAGNOSIS — I4891 Unspecified atrial fibrillation: Secondary | ICD-10-CM | POA: Diagnosis present

## 2021-01-06 DIAGNOSIS — N136 Pyonephrosis: Secondary | ICD-10-CM | POA: Diagnosis present

## 2021-01-06 DIAGNOSIS — M6259 Muscle wasting and atrophy, not elsewhere classified, multiple sites: Secondary | ICD-10-CM | POA: Diagnosis not present

## 2021-01-06 DIAGNOSIS — R262 Difficulty in walking, not elsewhere classified: Secondary | ICD-10-CM | POA: Diagnosis not present

## 2021-01-06 DIAGNOSIS — M4854XA Collapsed vertebra, not elsewhere classified, thoracic region, initial encounter for fracture: Secondary | ICD-10-CM | POA: Diagnosis present

## 2021-01-06 DIAGNOSIS — N3000 Acute cystitis without hematuria: Secondary | ICD-10-CM | POA: Diagnosis not present

## 2021-01-06 DIAGNOSIS — Z741 Need for assistance with personal care: Secondary | ICD-10-CM | POA: Diagnosis not present

## 2021-01-06 DIAGNOSIS — Z7901 Long term (current) use of anticoagulants: Secondary | ICD-10-CM | POA: Diagnosis not present

## 2021-01-06 DIAGNOSIS — D6859 Other primary thrombophilia: Secondary | ICD-10-CM | POA: Diagnosis present

## 2021-01-06 DIAGNOSIS — R41841 Cognitive communication deficit: Secondary | ICD-10-CM | POA: Diagnosis not present

## 2021-01-06 LAB — CBC WITH DIFFERENTIAL/PLATELET
Abs Immature Granulocytes: 0.03 10*3/uL (ref 0.00–0.07)
Basophils Absolute: 0 10*3/uL (ref 0.0–0.1)
Basophils Relative: 0 %
Eosinophils Absolute: 0 10*3/uL (ref 0.0–0.5)
Eosinophils Relative: 0 %
HCT: 41.5 % (ref 36.0–46.0)
Hemoglobin: 12.9 g/dL (ref 12.0–15.0)
Immature Granulocytes: 0 %
Lymphocytes Relative: 5 %
Lymphs Abs: 0.6 10*3/uL — ABNORMAL LOW (ref 0.7–4.0)
MCH: 28.5 pg (ref 26.0–34.0)
MCHC: 31.1 g/dL (ref 30.0–36.0)
MCV: 91.6 fL (ref 80.0–100.0)
Monocytes Absolute: 1.3 10*3/uL — ABNORMAL HIGH (ref 0.1–1.0)
Monocytes Relative: 10 %
Neutro Abs: 10.1 10*3/uL — ABNORMAL HIGH (ref 1.7–7.7)
Neutrophils Relative %: 85 %
Platelets: 164 10*3/uL (ref 150–400)
RBC: 4.53 MIL/uL (ref 3.87–5.11)
RDW: 16.5 % — ABNORMAL HIGH (ref 11.5–15.5)
WBC: 12.1 10*3/uL — ABNORMAL HIGH (ref 4.0–10.5)
nRBC: 0 % (ref 0.0–0.2)

## 2021-01-06 LAB — PROTIME-INR
INR: 1.4 — ABNORMAL HIGH (ref 0.8–1.2)
Prothrombin Time: 17.3 seconds — ABNORMAL HIGH (ref 11.4–15.2)

## 2021-01-06 LAB — BASIC METABOLIC PANEL
Anion gap: 10 (ref 5–15)
BUN: 32 mg/dL — ABNORMAL HIGH (ref 8–23)
CO2: 18 mmol/L — ABNORMAL LOW (ref 22–32)
Calcium: 10.3 mg/dL (ref 8.9–10.3)
Chloride: 110 mmol/L (ref 98–111)
Creatinine, Ser: 1.35 mg/dL — ABNORMAL HIGH (ref 0.44–1.00)
GFR, Estimated: 39 mL/min — ABNORMAL LOW (ref >60)
Glucose, Bld: 121 mg/dL — ABNORMAL HIGH (ref 70–99)
Potassium: 3.9 mmol/L (ref 3.5–5.1)
Sodium: 138 mmol/L (ref 135–145)

## 2021-01-06 LAB — MAGNESIUM: Magnesium: 2.1 mg/dL (ref 1.7–2.4)

## 2021-01-06 MED ORDER — TRAMADOL HCL 50 MG PO TABS
50.0000 mg | ORAL_TABLET | Freq: Four times a day (QID) | ORAL | Status: DC | PRN
  Filled 2021-01-06: qty 1

## 2021-01-06 MED ORDER — HYDROCODONE-ACETAMINOPHEN 5-325 MG PO TABS
1.0000 | ORAL_TABLET | Freq: Four times a day (QID) | ORAL | Status: DC | PRN
  Administered 2021-01-06 – 2021-01-12 (×15): 1 via ORAL
  Filled 2021-01-06 (×17): qty 1

## 2021-01-06 MED ORDER — WARFARIN SODIUM 5 MG PO TABS
10.0000 mg | ORAL_TABLET | Freq: Once | ORAL | Status: AC
  Administered 2021-01-06: 10 mg via ORAL
  Filled 2021-01-06: qty 2

## 2021-01-06 NOTE — Evaluation (Signed)
Occupational Therapy Evaluation Patient Details Name: Alicia Vasquez [facility] MRN: [id number] DOB: Nov 08, 1940 Today's Date: 01/06/2021    History of Present Illness 80 yo female admitted with ARF, fall. Hx of met breast cancer, DVT, L TKA 2018, L hip replacement   Clinical Impression   Patient lives with a friend, reports she is typically independent with basic self care tasks, uses her walker for short distance ambulation otherwise in wheelchair. Currently patient needing up to mod A x2 for safety for functional transfer to commode due to painful L hip which radiates to back. Patient unsafe, letting go of walker to grab onto middle of frame, needing mod to max cues to redirect. Also needing total A for perianal care after small B< due to heavily reliant on UE support of walker. Unsure how much physical assistance patient's friend can provide, but due to current impairments would recommend rehab to maximize patient independence prior to returning home.     Follow Up Recommendations  SNF    Equipment Recommendations  None recommended by OT       Precautions / Restrictions Precautions Precautions: Fall Restrictions Weight Bearing Restrictions: No      Mobility Bed Mobility               General bed mobility comments: in chair    Transfers Overall transfer level: Needs assistance Equipment used: Rolling walker (2 wheeled) Transfers: Sit to/from [facility] Sit to Stand: Mod assist;+2 safety/equipment Stand pivot transfers: Mod assist;+2 safety/equipment       General transfer comment: please see toilet transfer in ADL section; high fall risk limited standing tolerance and balance impacted by pain    Balance Overall balance assessment: History of Falls;Needs assistance Sitting-balance support: Feet supported       Standing balance support: Bilateral upper extremity supported Standing balance-Leahy Scale: Poor Standing balance comment: reliant on UE  support and up to mod A                           ADL either performed or assessed with clinical judgement   ADL Overall ADL's : Needs assistance/impaired     Grooming: Set up;Sitting   Upper Body Bathing: Set up;Sitting   Lower Body Bathing: Maximal assistance;Sitting/lateral leans;Sit to/from stand   Upper Body Dressing : Set up;Sitting   Lower Body Dressing: Maximal assistance;Sit to/from stand;Sitting/lateral leans Lower Body Dressing Details (indicate cue type and reason): significant L LE pain, limited standing tolerance and balance Toilet Transfer: Moderate assistance;+2 for safety/equipment;Stand-pivot;Cueing for sequencing;Cueing for safety;RW;BSC Toilet Transfer Details (indicate cue type and reason): mod A with cues to push from [facility] to power up to standing. limited standing tolerance due to L LE pain. Patient frequently letting go of walker and holding onto middle of walker needing mod to max cues to correct for safety. Toileting- Clothing Manipulation and Hygiene: Total assistance;Sit to/from stand Toileting - Clothing Manipulation Details (indicate cue type and reason): for peri care post small bowel movement, heavily reliant on UE support     Functional mobility during ADLs: Moderate assistance;+2 for safety/equipment;Cueing for safety;Cueing for sequencing;Rolling walker        Pertinent Vitals/Pain Pain Assessment: Faces Faces Pain Scale: Hurts even more Pain Location: L LE/ hip, back Pain Descriptors / Indicators: Grimacing Pain Intervention(s): Patient requesting pain meds-RN notified     Hand Dominance Right   Extremity/Trunk Assessment Upper Extremity Assessment Upper Extremity Assessment: Generalized weakness   Lower Extremity Assessment Lower  Extremity Assessment: Defer to PT evaluation       Communication Communication Communication: HOH   Cognition Arousal/Alertness: Awake/alert Behavior During Therapy: WFL for tasks  assessed/performed Overall Cognitive Status: Difficult to assess                                 General Comments: patient is hard of hearing, answering questions appropriately however during functional transfer poor safety awareness and needing mod to max cues to carry over hand placement on walker              Home Living Family/patient expects to be discharged to:: Private residence Living Arrangements: Non-relatives/Friends Available Help at Discharge: Friend(s) Type of Home: House Home Access: Stairs to enter Technical brewer of Steps: 1 Entrance Stairs-Rails: None Home Layout: One level     Bathroom Shower/Tub: Occupational psychologist: Handicapped height     Home Equipment: Environmental consultant - 2 wheels;Wheelchair - manual;Shower seat;Grab bars - tub/shower;Hand held shower head;Bedside commode   Additional Comments: wheelchair is broken      Prior Functioning/Environment Level of Independence: Independent with assistive device(s)        Comments: uses RW for short household distances. Otherwise, uses wheelchair        OT Problem List: Pain;Decreased strength;Decreased activity tolerance;Impaired balance (sitting and/or standing);Decreased safety awareness;Obesity      OT Treatment/Interventions: Self-care/ADL training;Balance training;Patient/family education;Therapeutic activities;DME and/or AE instruction    OT Goals(Current goals can be found in the care plan section) Acute Rehab OT Goals Patient Stated Goal: less pain OT Goal Formulation: With patient Time For Goal Achievement: 01/20/21 Potential to Achieve Goals: Good  OT Frequency: Min 2X/week    AM-PAC OT "6 Clicks" Daily Activity     Outcome Measure Help from another person eating meals?: None Help from another person taking care of personal grooming?: A Little Help from another person toileting, which includes using toliet, bedpan, or urinal?: A Lot Help from another person  bathing (including washing, rinsing, drying)?: A Lot Help from another person to put on and taking off regular upper body clothing?: A Little Help from another person to put on and taking off regular lower body clothing?: A Lot 6 Click Score: 16   End of Session Equipment Utilized During Treatment: Rolling walker Nurse Communication: Mobility status;Patient requests pain meds  Activity Tolerance: Patient limited by pain Patient left: in chair;with call bell/phone within reach;with chair alarm set  OT Visit Diagnosis: Unsteadiness on feet (R26.81);Other abnormalities of gait and mobility (R26.89);History of falling (Z91.81);Pain Pain - Right/Left: Left Pain - part of body: Hip;Leg                Time: 7253-6644 OT Time Calculation (min): 28 min Charges:  OT General Charges $OT Visit: 1 Visit OT Evaluation $OT Eval Low Complexity: 1 Low OT Treatments $Self Care/Home Management : 8-22 mins  Delbert Phenix OT OT pager: [place] 01/06/2021, 2:06 PM

## 2021-01-06 NOTE — Progress Notes (Signed)
PROGRESS NOTE    Alicia Vasquez  [id number] DOB: 06/03/1940 DOA: 01/04/2021 PCP: [name], MD   Brief Narrative: 80 year old female lives at home with her friend.  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  Patient has been having nausea vomiting decreased p.o. intake.  She tripped and fell and hit her head did not lose consciousness.  CT of the head and spine unremarkable.  She was found to be in AKI with leukocytosis and UA consistent with UTI.  Patient was started on Rocephin.  Her prior urine culture grew Proteus Mirabella's sensitive to Rocephin.  She is being admitted for AKI and UTI.  She was seen by physical therapy recommending patient needs SNF.  Assessment & Plan:   Principal Problem:   ARF (acute renal failure) (HCC) Active Problems:   Hypothyroid   HTN (hypertension)   Primary hypercoagulable state ([place])   DVT, lower extremity ([place])   AKI (acute kidney injury) ([place])    #1 AKI secondary to dehydration decreased p.o. intake.  Creatinine 1.35 down from 1.72 on admission with IV hydration.  Avoid nephrotoxins.  #2 urinary tract infection UA consistent with UTI CT shows obstructive renal stone on Rocephin White count improving.  WBC 12 from 16.3 on admission.  Discussed with urology follow-up as an outpatient for lithotripsy.   #3 DVT on Coumadin dosing by pharmacy  #4 hypothyroidism on Synthroid  #5 metastatic breast cancer followed by oncology  #6 history of hyperparathyroidism followed by endocrinology  #7 severe deconditioning and generalized weakness PT consulted recommending SNF.  #8 lumbar compression fracture will order MRI of the lumbar spine and consult IR  Estimated body mass index is 37.65 kg/m as calculated from the following:   Height as of this encounter: '5\' 4"'$  (1.626 m).   Weight as of this encounter: 99.5 kg.  DVT prophylaxis:  Coumadin  code Status: Full code Family Communication: None at bedside Disposition Plan:  Status is: Inpatient  Remains inpatient appropriate because:IV treatments appropriate due to intensity of illness or inability to take PO  Dispo: The patient is from: Home              Anticipated d/c is to: SNF              Patient currently is not medically stable to d/c.   Difficult to place patient No       Consultants:  None  Procedures: None Antimicrobials: Rocephin  Subjective: Patient resting in bed overnight staff reported that she had complaints of back pain and is requesting stronger pain medications She is awake oriented to hospital however does not answer my questions appropriately.  Objective: Vitals:   01/05/21 1400 01/05/21 2120 01/06/21 0547 01/06/21 0946  BP: (!) 127/54 (!) 148/76 (!) 163/86 134/68  Pulse:  93 (!) 108   Resp:  20 20   Temp:  98.8 F (37.1 C) 98.5 F (36.9 C)   TempSrc:  Oral Oral   SpO2:  94% 93% 95%  Weight:      Height:        Intake/Output Summary (Last 24 hours) at 01/06/2021 1412 Last data filed at 01/06/2021 1029 Gross per 24 hour  Intake 1334.05 ml  Output 950 ml  Net 384.05 ml   Filed Weights   01/04/21 2217 01/05/21 0257  Weight: 102.1 kg 99.5 kg    Examination:  General exam: Appears calm and comfortable  Respiratory system: Clear to auscultation. Respiratory effort normal. Cardiovascular system: S1 & S2 heard, RRR. No JVD, murmurs, rubs, gallops or clicks. No pedal edema. Gastrointestinal system: Abdomen is nondistended, soft and nontender. No organomegaly or masses felt. Normal bowel sounds heard. Central nervous system: Periods of confusion noted  extremities: Trace edema bilaterally Skin: No rashes, lesions or ulcers Psychiatry: Judgement and insight appear normal. Mood & affect appropriate.     Data Reviewed: I have personally reviewed following labs and imaging studies  CBC: Recent Labs  Lab 01/04/21 2330  01/05/21 0535 01/06/21 0352  WBC 16.3* 15.3* 12.1*  NEUTROABS 13.2*  --  10.1*  HGB 13.6 12.7 12.9  HCT 42.3 38.6 41.5  MCV 88.9 89.4 91.6  PLT 211 187 [id number]   Basic Metabolic Panel: Recent Labs  Lab 01/04/21 2330 01/05/21 0535 01/06/21 0352  NA 137 136 138  K 4.4 3.8 3.9  CL 101 103 110  CO2 26 23 18*  GLUCOSE 128* 136* 121*  BUN 36* 34* 32*  CREATININE 1.72* 1.62* 1.35*  CALCIUM 10.8* 10.5* 10.3  MG  --   --  2.1   GFR: Estimated Creatinine Clearance: 37.5 mL/min (A) (by C-G formula based on SCr of 1.35 mg/dL (H)). Liver Function Tests: Recent Labs  Lab 01/04/21 2330  AST 20  ALT 20  ALKPHOS 91  BILITOT 1.6*  PROT 7.3  ALBUMIN 3.8   No results for input(s): LIPASE, AMYLASE in the last 168 hours. No results for input(s): AMMONIA in the last 168 hours. Coagulation Profile: Recent Labs  Lab 01/05/21 0535 01/06/21 0352  INR 1.4* 1.4*   Cardiac Enzymes: No results for input(s): CKTOTAL, CKMB, CKMBINDEX, TROPONINI in the last 168 hours. BNP (last 3 results) No results for input(s): PROBNP in the last 8760 hours. HbA1C: No results for input(s): HGBA1C in the last 72 hours. CBG: No results for input(s): GLUCAP in the last 168 hours. Lipid Profile: No results for input(s): CHOL, HDL, LDLCALC, TRIG, CHOLHDL, LDLDIRECT in the last 72 hours. Thyroid Function Tests: No results for input(s): TSH, T4TOTAL, FREET4, T3FREE, THYROIDAB in the last 72 hours. Anemia Panel: No results for input(s): VITAMINB12, FOLATE, FERRITIN, TIBC, IRON, RETICCTPCT in the last 72 hours. Sepsis Labs: No results for input(s): PROCALCITON, LATICACIDVEN in the last 168 hours.  Recent Results (from the past 240 hour(s))  Resp Panel by RT-PCR (Flu A&B, Covid) Nasopharyngeal Swab     Status: None   Collection Time: 01/04/21 11:30 PM   Specimen: Nasopharyngeal Swab; Nasopharyngeal(NP) swabs in vial transport medium  Result Value Ref Range Status   SARS Coronavirus 2 by RT PCR NEGATIVE  NEGATIVE Final    Comment: (NOTE) SARS-CoV-2 target nucleic acids are NOT DETECTED.  The SARS-CoV-2 RNA is generally detectable in upper respiratory specimens during the acute phase of infection. The lowest concentration of SARS-CoV-2 viral copies this assay can detect is 138 copies/mL. A negative result does not preclude SARS-Cov-2 infection and should not be used as the sole basis for treatment or other patient management decisions. A negative result may occur with  improper specimen collection/handling, submission of specimen other than nasopharyngeal swab, presence of viral mutation(s) within the areas targeted by this assay, and inadequate number of viral copies(<138 copies/mL). A negative result must be combined with clinical observations, patient history, and epidemiological information. The expected result is Negative.  Fact Sheet for Patients:  EntrepreneurPulse.com.au  Fact Sheet for Healthcare Providers:  IncredibleEmployment.be  This test is no t yet approved or cleared  by the [place] and  has been authorized for detection and/or diagnosis of SARS-CoV-2 by FDA under an Emergency Use Authorization (EUA). This EUA will remain  in effect (meaning this test can be used) for the duration of the COVID-19 declaration under Section 564(b)(1) of the Act, 21 U.S.C.section 360bbb-3(b)(1), unless the authorization is terminated  or revoked sooner.       Influenza A by PCR NEGATIVE NEGATIVE Final   Influenza B by PCR NEGATIVE NEGATIVE Final    Comment: (NOTE) The Xpert Xpress SARS-CoV-2/FLU/RSV plus assay is intended as an aid in the diagnosis of influenza from Nasopharyngeal swab specimens and should not be used as a sole basis for treatment. Nasal washings and aspirates are unacceptable for Xpert Xpress SARS-CoV-2/FLU/RSV testing.  Fact Sheet for Patients: EntrepreneurPulse.com.au  Fact Sheet for Healthcare  Providers: IncredibleEmployment.be  This test is not yet approved or cleared by the [place] FDA and has been authorized for detection and/or diagnosis of SARS-CoV-2 by FDA under an Emergency Use Authorization (EUA). This EUA will remain in effect (meaning this test can be used) for the duration of the COVID-19 declaration under Section 564(b)(1) of the Act, 21 U.S.C. section 360bbb-3(b)(1), unless the authorization is terminated or revoked.  Performed at [facility], [place] [address] [place], [place] [address]          Radiology Studies: DG Chest 2 View  Result Date: 01/04/2021 CLINICAL DATA:  Pain, mechanical fall EXAM: CHEST - 2 VIEW COMPARISON:  01/16/2017 FINDINGS: The heart size and mediastinal contours are within normal limits. Both lungs are clear. The visualized skeletal structures are unremarkable. IMPRESSION: No active cardiopulmonary disease. Electronically Signed   By: Donavan Foil M.D.   On: 01/04/2021 23:31   DG Pelvis 1-2 Views  Result Date: 01/04/2021 CLINICAL DATA:  Pain after fall EXAM: PELVIS - 1-2 VIEW COMPARISON:  06/04/2020, 02/01/2017 FINDINGS: Left hip replacement with intact hardware and normal alignment. No definite acute displaced fracture is seen. Pubic symphysis and rami appear intact. Mild periprosthetic lucency at the left trochanter. IMPRESSION: Left hip replacement without acute osseous abnormality Electronically Signed   By: Donavan Foil M.D.   On: 01/04/2021 23:33   CT Head Wo Contrast  Result Date: 01/04/2021 CLINICAL DATA:  [place] out of wheelchair EXAM: CT HEAD WITHOUT CONTRAST CT CERVICAL SPINE WITHOUT CONTRAST TECHNIQUE: Multidetector CT imaging of the head and cervical spine was performed following the standard protocol without intravenous contrast. Multiplanar CT image reconstructions of the cervical spine were also generated. COMPARISON:  CT brain and cervical spine 09/25/2015 . FINDINGS: CT HEAD  FINDINGS Brain: no acute territorial infarction, hemorrhage, or intracranial mass. Interval encephalomalacia at the right frontal lobe. Mild atrophy and chronic small vessel ischemic changes of the white matter Vascular: No hyperdense vessels.  Carotid vascular calcification Skull: Normal. Negative for fracture or focal lesion. Sinuses/Orbits: No acute finding. Other: None CT CERVICAL SPINE FINDINGS Alignment: No subluxation.  Facet alignment within normal limits. Skull base and vertebrae: No acute fracture. No primary bone lesion or focal pathologic process. Incomplete fusion left posterior arch of C1 Soft tissues and spinal canal: No prevertebral fluid or swelling. No visible canal hematoma. Disc levels: Moderate severe degenerative changes C4-C5 and C5-C6. Facet degenerative changes at multiple levels with foraminal narrowing. Upper chest: Negative. Other: None IMPRESSION: 1. No CT evidence for acute intracranial abnormality. Interval right frontal lobe encephalomalacia. Mild atrophy and chronic small vessel ischemic changes of the white matter 2. Degenerative changes of  the cervical spine. No acute osseous abnormality Electronically Signed   By: Donavan Foil M.D.   On: 01/04/2021 23:24   CT Cervical Spine Wo Contrast  Result Date: 01/04/2021 CLINICAL DATA:  [place] out of wheelchair EXAM: CT HEAD WITHOUT CONTRAST CT CERVICAL SPINE WITHOUT CONTRAST TECHNIQUE: Multidetector CT imaging of the head and cervical spine was performed following the standard protocol without intravenous contrast. Multiplanar CT image reconstructions of the cervical spine were also generated. COMPARISON:  CT brain and cervical spine 09/25/2015 . FINDINGS: CT HEAD FINDINGS Brain: no acute territorial infarction, hemorrhage, or intracranial mass. Interval encephalomalacia at the right frontal lobe. Mild atrophy and chronic small vessel ischemic changes of the white matter Vascular: No hyperdense vessels.  Carotid vascular calcification  Skull: Normal. Negative for fracture or focal lesion. Sinuses/Orbits: No acute finding. Other: None CT CERVICAL SPINE FINDINGS Alignment: No subluxation.  Facet alignment within normal limits. Skull base and vertebrae: No acute fracture. No primary bone lesion or focal pathologic process. Incomplete fusion left posterior arch of C1 Soft tissues and spinal canal: No prevertebral fluid or swelling. No visible canal hematoma. Disc levels: Moderate severe degenerative changes C4-C5 and C5-C6. Facet degenerative changes at multiple levels with foraminal narrowing. Upper chest: Negative. Other: None IMPRESSION: 1. No CT evidence for acute intracranial abnormality. Interval right frontal lobe encephalomalacia. Mild atrophy and chronic small vessel ischemic changes of the white matter 2. Degenerative changes of the cervical spine. No acute osseous abnormality Electronically Signed   By: Donavan Foil M.D.   On: 01/04/2021 23:24   CT RENAL STONE STUDY  Result Date: 01/05/2021 CLINICAL DATA:  80 year old female status post fall yesterday. Weakness. Acute renal failure. EXAM: CT ABDOMEN AND PELVIS WITHOUT CONTRAST TECHNIQUE: Multidetector CT imaging of the abdomen and pelvis was performed following the standard protocol without IV contrast. COMPARISON:  CT Abdomen and Pelvis 01/15/2017.  Chest CT 03/18/2019. FINDINGS: Lower chest: Respiratory motion and lower lung volumes. Chronic benign calcified granuloma in the right costophrenic angle with associated mild atelectasis or scarring today. No pericardial or pleural effusion. Hepatobiliary: Negative noncontrast liver and gallbladder. Pancreas: Mild pancreatic atrophy. Spleen: Negative; small chronic calcified granulomas. Adrenals/Urinary Tract: Adrenal glands remain within normal limits. Negative noncontrast left kidney and left ureter. Moderate to severe right hydronephrosis with right nephromegaly and acute perinephric stranding. Bulky 11 mm intrarenal calculus.  Obstructing 6 mm calculus in the proximal right ureter just distal to the ureteropelvic junction (coronal image 82). Distal to that stone the right ureter is decompressed. There is continued periureteral stranding toward the pelvis. Both distal ureters appear decompressed and normal. Diminutive urinary bladder. Incidental pelvic phleboliths. Stomach/Bowel: Redundant large bowel with diverticulosis. No large bowel inflammation. Normal appendix on series 2, image 64. Negative terminal ileum. No dilated small bowel. Moderate gastric hiatal hernia. Otherwise negative stomach and duodenum. No free air. No abdominal free fluid. Vascular/Lymphatic: Aortoiliac calcified atherosclerosis. Normal caliber abdominal aorta. Vascular patency is not evaluated in the absence of IV contrast. No lymphadenopathy. Reproductive: Negative noncontrast appearance. Other: Mild presacral stranding is new.  No pelvic free fluid. Musculoskeletal: Osteopenia. No destructive osseous lesion identified. However, T12 compression fracture has progressed since 2020, now moderate to severe (series 5, image 105. Furthermore, mild compression of the T11 vertebral body is new since 2020. Mild compression of the L4 superior endplate is new since [id number]. No significant retropulsion at these levels. Left hip arthroplasty is new since 2018. IMPRESSION: 1. Acute obstructive uropathy on the right with a 6 mm proximal  right ureteral calculus just distal to the UPJ. Superimposed bulky 11 mm right intrarenal calculus. 2. Osteopenia with new and progressed lower thoracic and lumbar compression fractures since 2020: T11, T12, and L4. If specific therapy such as vertebroplasty is desired, Lumbar MRI or Nuclear Medicine Whole-body Bone Scan would best determine acuity. 3. No other acute or inflammatory process identified in the noncontrast abdomen or pelvis. 4. Aortic Atherosclerosis (ICD10-I70.0). Electronically Signed   By: Genevie Ann M.D.   On: 01/05/2021 10:30         Scheduled Meds:  levothyroxine  125 mcg Oral QAC breakfast   mouth rinse  15 mL Mouth Rinse BID   warfarin  10 mg Oral ONCE-1600   Warfarin - Pharmacist Dosing Inpatient   Does not apply q1600   Continuous Infusions:  sodium chloride 75 mL/hr at 01/05/21 1611   cefTRIAXone (ROCEPHIN)  IV 2 g (01/06/21 0219)     LOS: 0 days    Time spent: 40 min    Georgette Shell, MD 01/06/2021, 2:12 PM

## 2021-01-06 NOTE — TOC Initial Note (Signed)
Transition of Care [facility]) - Initial/Assessment Note   Patient Details  Name: Ashelynn Vanbramer [facility] MRN: [id number] Date of Birth: 03/28/1941  Transition of Care [facility]) CM/SW Contact:    Sherie Don, LCSW Phone Number: 01/06/2021, 1:53 PM  Clinical Narrative: PT and OT evaluations recommend SNF. CSW spoke with patient who was somewhat disoriented, but agreeable to SNF for short-term rehab.  CSW attempted to call patient's son, but was unable to reach him so VM was left.  CSW spoke with patient's friend, [name]. Per Ms. Suella Broad, the patient's son is living in the patient's home that she owns, but the patient has been living with Ms. Suella Broad ,who is 80 years old, because "they don't get along too good." Ms. Suella Broad reported that she told the patient on July 27 that she will need to find another place to live as Ms. Suella Broad will be moving to an ALF soon and can no longer house the patient or be involved in her care.  FL2 done; PASRR confirmed. Initial referral faxed out. TOC awaiting bed offers.  Expected Discharge Plan: Skilled Nursing Facility Barriers to Discharge: Continued Medical Work up, SNF Pending bed offer  Patient Goals and CMS Choice Patient states their goals for this hospitalization and ongoing recovery are:: Walk better CMS Medicare.gov Compare Post Acute Care list provided to:: Patient Choice offered to / list presented to : Patient  Expected Discharge Plan and Services Expected Discharge Plan: [place] In-house Referral: Clinical Social Work Post Acute Care Choice: [place] Living arrangements for the past 2 months: [place]           DME Arranged: N/A DME Agency: NA  Prior Living Arrangements/Services Living arrangements for the past 2 months: Single Family Home Patient language and need for interpreter reviewed:: Yes Do you feel safe going back to the place where you live?: Yes      Need for Family Participation in Patient  Care: Yes (Comment) Care giver support system in place?: Yes (comment) Criminal Activity/Legal Involvement Pertinent to Current Situation/Hospitalization: No - Comment as needed  Activities of Daily Living Home Assistive Devices/Equipment: Wheelchair, Environmental consultant (specify type) (front wheel walker) ADL Screening (condition at time of admission) Patient's cognitive ability adequate to safely complete daily activities?: No Is the patient deaf or have difficulty hearing?: No Does the patient have difficulty seeing, even when wearing glasses/contacts?: No Does the patient have difficulty concentrating, remembering, or making decisions?: Yes Patient able to express need for assistance with ADLs?: Yes Does the patient have difficulty dressing or bathing?: Yes Independently performs ADLs?: No Communication: Independent Dressing (OT): Dependent Is this a change from baseline?: Pre-admission baseline Grooming: Dependent Is this a change from baseline?: Pre-admission baseline Feeding: Needs assistance Is this a change from baseline?: Pre-admission baseline Bathing: Dependent Is this a change from baseline?: Pre-admission baseline Toileting: Dependent Is this a change from baseline?: Pre-admission baseline In/Out Bed: Dependent Is this a change from baseline?: Pre-admission baseline Walks in Home: Dependent Is this a change from baseline?: Pre-admission baseline Does the patient have difficulty walking or climbing stairs?: Yes Weakness of Legs: Both Weakness of Arms/Hands: Both  Permission Sought/Granted Permission sought to share information with : Facility Art therapist granted to share information with : Yes, Verbal Permission Granted Permission granted to share info w AGENCY: SNFs  Emotional Assessment Attitude/Demeanor/Rapport: Engaged Affect (typically observed): Accepting Orientation: : Oriented to Self, Oriented to Place Alcohol / Substance Use: Not  Applicable Psych Involvement: No (comment)  Admission diagnosis:  Anorexia [R63.0] Contusion, multiple sites [T07.XXXA] ARF (acute renal failure) (HCC) [N17.9] Acute cystitis without hematuria [N30.00] AKI (acute kidney injury) ([place]) [N17.9] Current use of long term anticoagulation [Z79.01] Fall, initial encounter [W19.XXXA] Patient Active Problem List   Diagnosis Date Noted   ARF (acute renal failure) ([place]) 01/05/2021   Acute cystitis without hematuria    Aortic atherosclerosis ([place]) 06/13/2017   Pain from bone metastases ([place]) 01/25/2017   Bone metastases ([place]) 01/25/2017   Endometrial hyperplasia 01/16/2017   Lytic bone lesion of left femur 01/15/2017   Hip fracture, pathological ([place]) 01/15/2017   Lymphedema 09/17/2015   Long term current use of anticoagulant therapy 08/23/2015   DVT, lower extremity ([place]) 06/18/2015   Bilateral knee pain 04/03/2015   Arm edema 08/28/2014   Vitamin D deficiency 04/23/2014   Hyperparathyroidism ([place]) 04/23/2014   Depression 07/18/2013   Malignant neoplasm of upper-outer quadrant of right breast in female, estrogen receptor positive ([place]) 07/15/2013   Overactive bladder 01/30/2013   Primary hypercoagulable state ([place]) 12/19/2012   HTN (hypertension) 09/27/2011   Hearing loss 09/27/2011   Seasonal allergies 09/27/2011   Hypothyroid 08/26/2011   PCP:  [name], MD Pharmacy:   [id number] [place] [address]Phone: [phone]: [phone] [id number] [address] [id number] [id number]: [phone]: [phone] Risk Interventions No flowsheet data found.

## 2021-01-06 NOTE — NC FL2 (Signed)
[place] LEVEL OF CARE SCREENING TOOL     IDENTIFICATION  Patient Name: Alicia Vasquez Birthdate: [birth year] Sex: female Admission Date (Current Location): 01/04/2021  [facility] and [place] Number:  Herbalist and Address:  [facility],  [place], [place]      Provider Number: [id number]  Attending Physician Name and Address:  Georgette Shell, MD  Relative Name and Phone Number:  [name] (friend) Ph: [phone]    Current Level of Care: Hospital Recommended Level of Care: [place] Prior Approval Number:    Date Approved/Denied:   PASRR Number: [id number]  Discharge Plan: SNF    Current Diagnoses: Patient Active Problem List   Diagnosis Date Noted   ARF (acute renal failure) ([place]) 01/05/2021   Acute cystitis without hematuria    Aortic atherosclerosis ([place]) 06/13/2017   Pain from bone metastases ([place]) 01/25/2017   Bone metastases ([place]) 01/25/2017   Endometrial hyperplasia 01/16/2017   Lytic bone lesion of left femur 01/15/2017   Hip fracture, pathological ([place]) 01/15/2017   Lymphedema 09/17/2015   Long term current use of anticoagulant therapy 08/23/2015   DVT, lower extremity ([place]) 06/18/2015   Bilateral knee pain 04/03/2015   Arm edema 08/28/2014   Vitamin D deficiency 04/23/2014   Hyperparathyroidism ([place]) 04/23/2014   Depression 07/18/2013   Malignant neoplasm of upper-outer quadrant of right breast in female, estrogen receptor positive ([place]) 07/15/2013   Overactive bladder 01/30/2013   Primary hypercoagulable state ([place]) 12/19/2012   HTN (hypertension) 09/27/2011   Hearing loss 09/27/2011   Seasonal allergies 09/27/2011   Hypothyroid 08/26/2011    Orientation RESPIRATION BLADDER Height & Weight     Self, Place  Normal Incontinent Weight: 219 lb 5.7 oz (99.5 kg) Height:  '5\' 4"'$  (162.6 cm)  BEHAVIORAL SYMPTOMS/MOOD NEUROLOGICAL BOWEL NUTRITION STATUS      Continent  Diet  AMBULATORY STATUS COMMUNICATION OF NEEDS Skin   Extensive Assist Verbally Other (Comment) (Ecchymosis: bilateral legs & arms; contact dermatitis: bilateral breasts)                       Personal Care Assistance Level of Assistance  Bathing, Feeding, Dressing Bathing Assistance: Maximum assistance Feeding assistance: Independent Dressing Assistance: Maximum assistance     Functional Limitations Info  Sight, Hearing, Speech Sight Info: Impaired Hearing Info: Impaired Speech Info: Adequate    SPECIAL CARE FACTORS FREQUENCY  PT (By licensed PT), OT (By licensed OT)     PT Frequency: 5x's/week OT Frequency: 5x's/week            Contractures Contractures Info: Not present    Additional Factors Info  Code Status, Allergies Code Status Info: Full Allergies Info: Anesthetics, Amide, Benadryl (Diphenhydramine Hcl), Carbocaine (Mepivacaine Hcl), Codeine, Epinephrine, Sulfa Antibiotics, Diphenhydramine, Latex, Penicillins, Tramadol           Current Medications (01/06/2021):  This is the current hospital active medication list Current Facility-Administered Medications  Medication Dose Route Frequency Provider Last Rate Last Admin   0.9 %  sodium chloride infusion   Intravenous Continuous Dwyane Dee, MD 75 mL/hr at 01/05/21 1611 Rate Change at 01/05/21 1611   acetaminophen (TYLENOL) tablet 650 mg  650 mg Oral Q6H PRN Rise Patience, MD   650 mg at 01/06/21 1145   Or   acetaminophen (TYLENOL) suppository 650 mg  650 mg Rectal Q6H PRN Rise Patience, MD       cefTRIAXone (ROCEPHIN) 2 g  in sodium chloride 0.9 % 100 mL IVPB  2 g Intravenous Q24H Rise Patience, MD 200 mL/hr at 01/06/21 0219 2 g at 01/06/21 [id number]   hydrALAZINE (APRESOLINE) tablet 25 mg  25 mg Oral Q4H PRN Dwyane Dee, MD       labetalol (NORMODYNE) injection 10 mg  10 mg Intravenous Q4H PRN Dwyane Dee, MD   10 mg at 01/06/21 [id number]   levothyroxine (SYNTHROID) tablet 125 mcg  125  mcg Oral QAC breakfast Rise Patience, MD   125 mcg at 01/06/21 0532   MEDLINE mouth rinse  15 mL Mouth Rinse BID Rise Patience, MD   15 mL at 01/06/21 [id number]   Warfarin - Pharmacist Dosing Inpatient   Does not apply q1600 Nilda Simmer, [facility]         Discharge Medications: Please see discharge summary for a list of discharge medications.  Relevant Imaging Results:  Relevant Lab Results:   Additional Information SSN: [id number]  Sherie Don, LCSW

## 2021-01-06 NOTE — Progress Notes (Signed)
ANTICOAGULATION CONSULT NOTE -   Pharmacy Consult for Warfarin Indication: DVT  Allergies  Allergen Reactions   Anesthetics, Amide Hypertension   Benadryl [Diphenhydramine Hcl] Other (See Comments)    Dizziness   Carbocaine [Mepivacaine Hcl] Hypertension   Codeine Other (See Comments)    Dizziness   Epinephrine Hypertension   Sulfa Antibiotics Other (See Comments)    dizziness   Diphenhydramine    Latex Other (See Comments)    Blisters in mouth   Penicillins Rash    Has patient had a PCN reaction causing immediate rash, facial/tongue/throat swelling, SOB or lightheadedness with hypotension: Unknown Has patient had a PCN reaction causing severe rash involving mucus membranes or skin necrosis: Unknown Has patient had a PCN reaction that required hospitalization: Unknown Has patient had a PCN reaction occurring within the last 10 years: Unknown If all of the above answers are "NO", then may proceed with Cephalosporin use.    Tramadol Other (See Comments)    Sedation.     Patient Measurements: Height: '5\' 4"'$  (162.6 cm) Weight: 99.5 kg (219 lb 5.7 oz) IBW/kg (Calculated) : 54.7    Vital Signs: Temp: 98.5 F (36.9 C) (08/10 0547) Temp Source: Oral (08/10 0547) BP: 134/68 (08/10 0946) Pulse Rate: 108 (08/10 0547)  Labs: Recent Labs    01/04/21 2330 01/05/21 0535 01/06/21 0352  HGB 13.6 12.7 12.9  HCT 42.3 38.6 41.5  PLT 211 187 164  LABPROT  --  17.0* 17.3*  INR  --  1.4* 1.4*  CREATININE 1.72* 1.62* 1.35*     Estimated Creatinine Clearance: 37.5 mL/min (A) (by C-G formula based on SCr of 1.35 mg/dL (H)).   Medications:  PTA Warfarin '5mg'$  daily except 7.'5mg'$  MWF- dosed by Velora Heckler PCP per med rec but Rx sent by Dr Jana Hakim of Onc  Assessment: 80 yr female brought to ED s/p fall.  Urinalysis shows infection; AKI PMH significant for DVT, breast cancer, metastatic bone cancer, HTN On Warfarin PTA with last dose taken 01/04/21 = 7.5 mg per med rec/son but per ED RN  note pt stated she did not take it 8/7 or 8/8  01/06/2021  INR 1.4 again today after 7.5 mg of warfarin given yesterday- remains subtherapeutic. CBC WNL, No bleeding reported. Per RN admit note pt does have small hematoma on R side of head from fall.  Per RN note 8/8 pt did not take "eliquis" today or yesterday> so this may be why INR is low. Pt not on eliquis but on warfarin PTA.    CT: + for 2 stones in R kidney w/ hydronephrosis  Goal of Therapy:  INR 2-3    Plan:  F/u plans for kidney stones/hydronephrosis- message sent to Dr. Rodena Piety - she will f/u Warfarin 10 mg po x 1 dose today Daily PT/INR  Eudelia Bunch, Pharm.D 01/06/2021 1:08 PM

## 2021-01-07 ENCOUNTER — Inpatient Hospital Stay (HOSPITAL_COMMUNITY): Payer: Medicare Other | Admitting: Certified Registered Nurse Anesthetist

## 2021-01-07 ENCOUNTER — Inpatient Hospital Stay (HOSPITAL_COMMUNITY): Payer: Medicare Other

## 2021-01-07 ENCOUNTER — Encounter (HOSPITAL_COMMUNITY): Payer: Self-pay | Admitting: Internal Medicine

## 2021-01-07 ENCOUNTER — Encounter (HOSPITAL_COMMUNITY)
Admission: EM | Disposition: A | Discharge status: Skilled Nursing Facility | Payer: Self-pay | Source: Home / Self Care | Attending: Internal Medicine

## 2021-01-07 HISTORY — PX: CYSTOSCOPY W/ URETERAL STENT PLACEMENT: SHX1429

## 2021-01-07 LAB — LACTIC ACID, PLASMA
Lactic Acid, Venous: 2 mmol/L (ref 0.5–1.9)
Lactic Acid, Venous: 2.9 mmol/L (ref 0.5–1.9)
Lactic Acid, Venous: 1.4 mmol/L (ref 0.5–1.9)

## 2021-01-07 LAB — MRSA NEXT GEN BY PCR, NASAL: MRSA by PCR Next Gen: NOT DETECTED

## 2021-01-07 LAB — CBC WITH DIFFERENTIAL/PLATELET
Abs Immature Granulocytes: 0.12 10*3/uL — ABNORMAL HIGH (ref 0.00–0.07)
Basophils Absolute: 0 10*3/uL (ref 0.0–0.1)
Basophils Relative: 0 %
Eosinophils Absolute: 0 10*3/uL (ref 0.0–0.5)
Eosinophils Relative: 0 %
HCT: 37.9 % (ref 36.0–46.0)
Hemoglobin: 12 g/dL (ref 12.0–15.0)
Immature Granulocytes: 1 %
Lymphocytes Relative: 3 %
Lymphs Abs: 0.3 10*3/uL — ABNORMAL LOW (ref 0.7–4.0)
MCH: 28.5 pg (ref 26.0–34.0)
MCHC: 31.7 g/dL (ref 30.0–36.0)
MCV: 90 fL (ref 80.0–100.0)
Monocytes Absolute: 0.9 10*3/uL (ref 0.1–1.0)
Monocytes Relative: 8 %
Neutro Abs: 10.1 10*3/uL — ABNORMAL HIGH (ref 1.7–7.7)
Neutrophils Relative %: 88 %
Platelets: 127 10*3/uL — ABNORMAL LOW (ref 150–400)
RBC: 4.21 MIL/uL (ref 3.87–5.11)
RDW: 16.6 % — ABNORMAL HIGH (ref 11.5–15.5)
WBC: 11.4 10*3/uL — ABNORMAL HIGH (ref 4.0–10.5)
nRBC: 0 % (ref 0.0–0.2)

## 2021-01-07 LAB — BASIC METABOLIC PANEL
Anion gap: 10 (ref 5–15)
BUN: 34 mg/dL — ABNORMAL HIGH (ref 8–23)
CO2: 20 mmol/L — ABNORMAL LOW (ref 22–32)
Calcium: 10.5 mg/dL — ABNORMAL HIGH (ref 8.9–10.3)
Chloride: 108 mmol/L (ref 98–111)
Creatinine, Ser: 1.51 mg/dL — ABNORMAL HIGH (ref 0.44–1.00)
GFR, Estimated: 35 mL/min — ABNORMAL LOW (ref >60)
Glucose, Bld: 151 mg/dL — ABNORMAL HIGH (ref 70–99)
Potassium: 3.7 mmol/L (ref 3.5–5.1)
Sodium: 138 mmol/L (ref 135–145)

## 2021-01-07 LAB — MAGNESIUM: Magnesium: 2 mg/dL (ref 1.7–2.4)

## 2021-01-07 LAB — PROTIME-INR
INR: 2.4 — ABNORMAL HIGH (ref 0.8–1.2)
Prothrombin Time: 26.1 seconds — ABNORMAL HIGH (ref 11.4–15.2)

## 2021-01-07 SURGERY — CYSTOSCOPY, WITH RETROGRADE PYELOGRAM AND URETERAL STENT INSERTION
Anesthesia: General | Laterality: Right

## 2021-01-07 MED ORDER — FENTANYL CITRATE (PF) 100 MCG/2ML IJ SOLN
INTRAMUSCULAR | Status: DC | PRN
  Administered 2021-01-07 (×2): 12.5 ug via INTRAVENOUS

## 2021-01-07 MED ORDER — CHLORHEXIDINE GLUCONATE CLOTH 2 % EX PADS
6.0000 | MEDICATED_PAD | Freq: Every day | CUTANEOUS | Status: DC
  Administered 2021-01-07 – 2021-01-09 (×3): 6 via TOPICAL

## 2021-01-07 MED ORDER — PROPOFOL 10 MG/ML IV BOLUS
INTRAVENOUS | Status: DC | PRN
  Administered 2021-01-07: 100 mg via INTRAVENOUS

## 2021-01-07 MED ORDER — DILTIAZEM LOAD VIA INFUSION: 10.0000 mg | Freq: Once | INTRAVENOUS | Status: DC

## 2021-01-07 MED ORDER — ONDANSETRON HCL 4 MG/2ML IJ SOLN
INTRAMUSCULAR | Status: DC | PRN
  Administered 2021-01-07: 4 mg via INTRAVENOUS

## 2021-01-07 MED ORDER — DEXAMETHASONE SODIUM PHOSPHATE 10 MG/ML IJ SOLN
INTRAMUSCULAR | Status: AC
  Filled 2021-01-07: qty 1

## 2021-01-07 MED ORDER — LEVALBUTEROL HCL 0.63 MG/3ML IN NEBU
0.6300 mg | INHALATION_SOLUTION | Freq: Once | RESPIRATORY_TRACT | Status: AC
  Administered 2021-01-07: 0.63 mg via RESPIRATORY_TRACT
  Filled 2021-01-07: qty 3

## 2021-01-07 MED ORDER — LACTATED RINGERS IV BOLUS
1000.0000 mL | Freq: Once | INTRAVENOUS | Status: AC
  Administered 2021-01-07: 1000 mL via INTRAVENOUS

## 2021-01-07 MED ORDER — WARFARIN SODIUM 5 MG PO TABS
5.0000 mg | ORAL_TABLET | Freq: Once | ORAL | Status: AC
  Administered 2021-01-07: 5 mg via ORAL
  Filled 2021-01-07: qty 1

## 2021-01-07 MED ORDER — LACTATED RINGERS IV SOLN: INTRAVENOUS | Status: DC

## 2021-01-07 MED ORDER — SODIUM CHLORIDE 0.9% FLUSH
10.0000 mL | Freq: Two times a day (BID) | INTRAVENOUS | Status: DC
  Administered 2021-01-07 – 2021-01-12 (×11): 10 mL

## 2021-01-07 MED ORDER — LIDOCAINE 2% (20 MG/ML) 5 ML SYRINGE
INTRAMUSCULAR | Status: AC
  Filled 2021-01-07: qty 5

## 2021-01-07 MED ORDER — LEVALBUTEROL HCL 0.63 MG/3ML IN NEBU
0.6300 mg | INHALATION_SOLUTION | Freq: Four times a day (QID) | RESPIRATORY_TRACT | Status: DC
  Administered 2021-01-07 – 2021-01-08 (×3): 0.63 mg via RESPIRATORY_TRACT
  Filled 2021-01-07 (×3): qty 3

## 2021-01-07 MED ORDER — DEXAMETHASONE SODIUM PHOSPHATE 10 MG/ML IJ SOLN
INTRAMUSCULAR | Status: DC | PRN
  Administered 2021-01-07: 5 mg via INTRAVENOUS

## 2021-01-07 MED ORDER — PROPOFOL 10 MG/ML IV BOLUS
INTRAVENOUS | Status: AC
  Filled 2021-01-07: qty 20

## 2021-01-07 MED ORDER — PHENYLEPHRINE 40 MCG/ML (10ML) SYRINGE FOR IV PUSH (FOR BLOOD PRESSURE SUPPORT)
PREFILLED_SYRINGE | INTRAVENOUS | Status: DC | PRN
  Administered 2021-01-07 (×2): 120 ug via INTRAVENOUS

## 2021-01-07 MED ORDER — IOHEXOL 300 MG/ML  SOLN
INTRAMUSCULAR | Status: DC | PRN
  Administered 2021-01-07: 5 mL

## 2021-01-07 MED ORDER — ONDANSETRON HCL 4 MG/2ML IJ SOLN
INTRAMUSCULAR | Status: AC
  Filled 2021-01-07: qty 2

## 2021-01-07 MED ORDER — DILTIAZEM HCL 25 MG/5ML IV SOLN
10.0000 mg | Freq: Once | INTRAVENOUS | Status: AC
  Administered 2021-01-07: 10 mg via INTRAVENOUS
  Filled 2021-01-07: qty 5

## 2021-01-07 MED ORDER — FENTANYL CITRATE (PF) 100 MCG/2ML IJ SOLN
INTRAMUSCULAR | Status: AC
  Filled 2021-01-07: qty 2

## 2021-01-07 MED ORDER — PHENYLEPHRINE HCL-NACL 20-0.9 MG/250ML-% IV SOLN
INTRAVENOUS | Status: DC | PRN
  Administered 2021-01-07: 35 ug/min via INTRAVENOUS

## 2021-01-07 MED ORDER — SODIUM CHLORIDE 0.9% FLUSH: 10.0000 mL | INTRAVENOUS | Status: DC | PRN

## 2021-01-07 MED ORDER — SODIUM CHLORIDE 0.9 % IR SOLN
Status: DC | PRN
  Administered 2021-01-07: 3000 mL via INTRAVESICAL

## 2021-01-07 MED ORDER — FUROSEMIDE 10 MG/ML IJ SOLN
40.0000 mg | Freq: Once | INTRAMUSCULAR | Status: AC
  Administered 2021-01-07: 40 mg via INTRAVENOUS
  Filled 2021-01-07: qty 4

## 2021-01-07 MED ORDER — LIDOCAINE 2% (20 MG/ML) 5 ML SYRINGE
INTRAMUSCULAR | Status: DC | PRN
  Administered 2021-01-07: 100 mg via INTRAVENOUS

## 2021-01-07 SURGICAL SUPPLY — 12 items
BAG URO CATCHER STRL LF (MISCELLANEOUS) ×2 IMPLANT
CATH INTERMIT  6FR 70CM (CATHETERS) ×2 IMPLANT
CLOTH BEACON ORANGE TIMEOUT ST (SAFETY) ×2 IMPLANT
GLOVE SURG ENC TEXT LTX SZ7.5 (GLOVE) ×2 IMPLANT
GOWN STRL REUS W/TWL XL LVL3 (GOWN DISPOSABLE) ×4 IMPLANT
GUIDEWIRE STR DUAL SENSOR (WIRE) ×2 IMPLANT
KIT TURNOVER KIT A (KITS) ×2 IMPLANT
MANIFOLD NEPTUNE II (INSTRUMENTS) ×2 IMPLANT
PACK CYSTO (CUSTOM PROCEDURE TRAY) ×2 IMPLANT
STENT URET 6FRX24 CONTOUR (STENTS) ×1 IMPLANT
TRAY FOLEY MTR SLVR 16FR STAT (SET/KITS/TRAYS/PACK) ×1 IMPLANT
TUBING CONNECTING 10 (TUBING) IMPLANT

## 2021-01-07 NOTE — Anesthesia Preprocedure Evaluation (Addendum)
Anesthesia Evaluation  Patient identified by MRN, date of birth, ID band Patient awake    Reviewed: Allergy & Precautions, NPO status , Patient's Chart, lab work & pertinent test results  History of Anesthesia Complications (+) history of anesthetic complications (Hypertension with dental local with epi)  Airway Mallampati: II  TM Distance: >3 FB Neck ROM: Full    Dental  (+) Poor Dentition, Missing, Dental Advisory Given   Pulmonary neg pulmonary ROS, shortness of breath,    breath sounds clear to auscultation       Cardiovascular hypertension, (-) angina+ DVT (2013)   Rhythm:Regular Rate:Normal  8/18 ECHO: EF 65-70%, valves oK   Neuro/Psych PSYCHIATRIC DISORDERS Anxiety Depression negative neurological ROS     GI/Hepatic negative GI ROS, Neg liver ROS,   Endo/Other  negative endocrine ROSHypothyroidism Morbid obesity  Renal/GU ARFRenal disease  negative genitourinary   Musculoskeletal negative musculoskeletal ROS (+) Arthritis ,   Abdominal (+) + obese,   Peds negative pediatric ROS (+)  Hematology negative hematology ROS (+) Blood dyscrasia (10.0), anemia ,   Anesthesia Other Findings Breast cancer: XRT  Reproductive/Obstetrics negative OB ROS                            Anesthesia Physical  Anesthesia Plan  ASA: 4 and emergent  Anesthesia Plan: General   Post-op Pain Management:    Induction: Intravenous  PONV Risk Score and Plan: 4 or greater and Ondansetron, Dexamethasone and Midazolam  Airway Management Planned: Oral ETT and LMA  Additional Equipment:   Intra-op Plan:   Post-operative Plan: Extubation in OR  Informed Consent: I have reviewed the patients History and Physical, chart, labs and discussed the procedure including the risks, benefits and alternatives for the proposed anesthesia with the patient or authorized representative who has indicated his/her  understanding and acceptance.     Dental advisory given  Plan Discussed with: CRNA and Surgeon  Anesthesia Plan Comments: (Plan routine monitors, GETA)       Anesthesia Quick Evaluation

## 2021-01-07 NOTE — Anesthesia Procedure Notes (Signed)
Procedure Name: LMA Insertion Date/Time: 01/07/2021 12:10 PM Performed by: Maxwell Caul, CRNA Pre-anesthesia Checklist: Patient identified, Emergency Drugs available, Suction available and Patient being monitored Patient Re-evaluated:Patient Re-evaluated prior to induction Oxygen Delivery Method: Circle system utilized Preoxygenation: Pre-oxygenation with 100% oxygen Induction Type: IV induction LMA: LMA with gastric port inserted LMA Size: 4.0 Number of attempts: 1 Placement Confirmation: positive ETCO2 and breath sounds checked- equal and bilateral Tube secured with: Tape Dental Injury: Teeth and Oropharynx as per pre-operative assessment

## 2021-01-07 NOTE — Op Note (Signed)
Preoperative diagnosis: Urinary tract infection and right ureteral stone Postoperative diagnosis: Urinary tract infection and right ureteral stone  Surgery: Cystoscopy and retrograde ureterogram and insertion of right ureteral stent Surgeon: Dr. Nicki Reaper Charliegh Vasudevan  The patient has the above diagnosis and consented the above procedure.  Extra care was taken with leg positioning.  She underwent cystoscopy utilizing sterile technique after preoperative antibiotics were given.  She had mild bladder erythema.  No stone in bladder.  Trigone easily identified  Under fluoroscopic and cystoscopic guidance I passed a sensor wire to the upper third of the right ureter.  I passed a 6 [place] open ureteral catheter about level removing the wire.  I did a gentle retrograde using 6 cc of contrast.  She had mild hydroureter.  I then passed a sensor wire curling in the bladder.  Over the wire I passed a 24 cm x 6 French double-J stent with no string.  It curled nicely in bladder and renal pelvis.  There was mild volcano effect.  Foley catheter was inserted.  Patient will be followed as per protocol

## 2021-01-07 NOTE — Progress Notes (Signed)
ANTICOAGULATION CONSULT NOTE   Pharmacy Consult for Warfarin Indication: DVT  Patient Measurements: Height: '5\' 4"'$  (162.6 cm) Weight: 105.7 kg (233 lb 0.4 oz) IBW/kg (Calculated) : 54.7  Vital Signs: Temp: 97.6 F (36.4 C) (08/11 0735) Temp Source: Oral (08/11 0735) BP: 154/49 (08/11 0800) Pulse Rate: 85 (08/11 0800)  Labs: Recent Labs    01/05/21 0535 01/06/21 0352 01/07/21 0317 01/07/21 0552  HGB 12.7 12.9 12.0  --   HCT 38.6 41.5 37.9  --   PLT 187 164 127*  --   LABPROT 17.0* 17.3*  --  26.1*  INR 1.4* 1.4*  --  2.4*  CREATININE 1.62* 1.35* 1.51*  --      Estimated Creatinine Clearance: 34.6 mL/min (A) (by C-G formula based on SCr of 1.51 mg/dL (H)).   Medications:  PTA Warfarin '5mg'$  daily except 7.'5mg'$  MWF- dosed by Velora Heckler PCP per med rec but Rx sent by Dr Jana Hakim of Onc  Assessment: 80 yr female brought to ED s/p fall.  Urinalysis shows infection; AKI.  PMH significant for DVT, breast cancer, metastatic bone cancer, HTN.  Warfarin PTA with last dose taken 01/04/21 per med rec/son but per ED RN note pt stated she did not take it 8/7 or 8/8. Admit INR 1.4.  Today, 01/07/2021:   INR with large increase to 2.4, therapeutic s/p 7.5 mg, '10mg'$  CBC:  Hgb wnl, Plt decreased to 127k No bleeding reported. Per RN admit note pt does have small hematoma on R side of head from fall.   Urology is now planning stent today given AMS, fever, afib, code sepsis overnight.    Goal of Therapy:  INR 2-3    Plan:  Warfarin 5 mg po x 1 dose today - Follow up after stent procedure, prior to 1600 dose.  RN aware. Daily PT/INR  Gretta Arab PharmD, BCPS Clinical Pharmacist [facility] main pharmacy [phone] 01/07/2021 9:40 AM

## 2021-01-07 NOTE — Significant Event (Signed)
Rapid Response Event Note   Reason for Call :  MEWS red  Initial Focused Assessment:  Pt was found to be minimally responsive, HR in the 130s, tachypnic, rectal temp of 105.7.  Interventions:  Provider called to bedside. Pt transferred to stepdown to control temperature and closer monitoring  Plan of Care:     Event Summary:   MD Notified: [id number] Call Time: 0015 Arrival Time: 0020 End Time: 0100  Julio Alm, RN

## 2021-01-07 NOTE — TOC Progression Note (Signed)
Transition of Care [facility]) - Progression Note    Patient Details  Name: Alicia Vasquez [facility] MRN: [id number] Date of Birth: 26-Oct-1940  Transition of Care [facility]) CM/SW Contact  Leeroy Cha, RN Phone Number: 01/07/2021, 7:28 AM  Clinical Narrative:    Patient transferred down from floor to SDU due to a.fib.  temp am of 081122=105.6, o2 at 3l/min, iv rocephin, bun-34/creat=1.51, GFR-35 Following for progression plan is to send to SNF when stable.  Expected Discharge Plan: [place] Barriers to Discharge: Continued Medical Work up, SNF Pending bed offer  Expected Discharge Plan and Services Expected Discharge Plan: [place] In-house Referral: Clinical Social Work   Post Acute Care Choice: [place] Living arrangements for the past 2 months: Single Family Home                 DME Arranged: N/A DME Agency: NA                   Social Determinants of Health (SDOH) Interventions    Readmission Risk Interventions No flowsheet data found.

## 2021-01-07 NOTE — Progress Notes (Addendum)
   01/07/21 0000  Assess: MEWS Score  Temp 98.6 F (37 C)  BP (!) 198/132  Pulse Rate (!) 148  Resp (!) 35  SpO2 94 %  O2 Device Room Air  Assess: MEWS Score  MEWS Temp 0  MEWS Systolic 0  MEWS Pulse 3  MEWS RR 2  MEWS LOC 0  MEWS Score 5  MEWS Score Color Red  Assess: if the MEWS score is Yellow or Red  Were vital signs taken at a resting state? Yes  Focused Assessment No change from prior assessment  Does the patient meet 2 or more of the SIRS criteria? Yes  Does the patient have a confirmed or suspected source of infection? Yes  Provider and Rapid Response Notified? Yes  MEWS guidelines implemented *See Row Information* Yes  Notify: Provider  Provider Name/Title Gershon Cull, NP  Date Provider Notified 01/06/21  Time Provider Notified 2356  Notification Type Page  Notification Reason Change in status  Provider response See new orders;At bedside  Date of Provider Response 01/07/21  Time of Provider Response 0017  Notify: Rapid Response  Name of Rapid Response RN Notified Danielle, RN  Date Rapid Response Notified 01/07/21  Time Rapid Response Notified 0016  Assess: SIRS CRITERIA  SIRS Temperature  0  SIRS Pulse 1  SIRS Respirations  1  SIRS WBC 0  SIRS Score Sum  2    8/10 (2358)- Upon return from MRI, patient placed back on the monitor, Patient seems Tachycardic. Full vital signs completed and charted. EKG completed and at the bedside.  Patient placed on 3L  with saturation of 80- 92%. On call provider notify of the critical changes. PRN Labetalol given to help decrease BP. Order for nebulizer treatment placed by the provider and administered, during that time, Rapid response is contacted and initiate.   Rapid Response Nurse and Provider at the bedside. Patient assessed by Rapid response nurse.  New orders placed by provider including transfer orders for the patient due to level of care to the ICU stepdown.   Patient transferred.  Patient's son contacted and  informed of patient status change that have occurred Patient's son verbalizes understanding.

## 2021-01-07 NOTE — Sepsis Progress Note (Addendum)
Lactic acid 2.0 ->2.9. A repeat Lactic acid has been ordered. She has received one Liter of LR and rocephin

## 2021-01-07 NOTE — Progress Notes (Addendum)
PROGRESS NOTE    Alicia Vasquez  [id number] DOB: [birth year] DOA: 01/04/2021 PCP: [name], MD   Brief Narrative: 80 year old female lives at home with her friend.  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  Patient has been having nausea vomiting decreased p.o. intake.  She tripped and fell and hit her head did not lose consciousness.  CT of the head and spine unremarkable.  She was found to be in AKI with leukocytosis and UA consistent with UTI.  Patient was started on Rocephin.  Her prior urine culture grew Proteus Mirabella's sensitive to Rocephin.  She is being admitted for AKI and UTI.  She was seen by physical therapy recommending patient needs SNF.  Assessment & Plan:   Principal Problem:   ARF (acute renal failure) (HCC) Active Problems:   Hypothyroid   HTN (hypertension)   Primary hypercoagulable state ([place])   DVT, lower extremity ([place])   AKI (acute kidney injury) ([place])    #1 AKI secondary to dehydration decreased p.o. intake.  Creatinine 1.51 from 1.35 yesterday hopefully this will improve with stent today.    #2 urinary tract infection UA consistent with UTI CT shows obstructive renal stone on Rocephin  Still with leukocytosis though improved. Status post stent in the right kidney 01/07/2021 Patient wheezing with shortness of breath, stat chest x-ray Xopenex and Lasix ordered DC IV fluids.   #3 DVT on Coumadin dosing by pharmacy  #4 hypothyroidism on Synthroid  #5 metastatic breast cancer followed by oncology  #6 history of hyperparathyroidism followed by endocrinology  #7 severe deconditioning and generalized weakness PT consulted recommending SNF.  #8 lumbar compression fracture -MRI lumbar spine acute to subacute compression fracture involving the superior endplate of L4 and L5.  Chronic T12 compression fracture.     Estimated body mass  index is 40 kg/m as calculated from the following:   Height as of this encounter: '5\' 4"'$  (1.626 m).   Weight as of this encounter: 105.7 kg.  DVT prophylaxis: Coumadin  code Status: Full code Family Communication: None at bedside Disposition Plan:  Status is: Inpatient  Remains inpatient appropriate because:IV treatments appropriate due to intensity of illness or inability to take PO  Dispo: The patient is from: Home              Anticipated d/c is to: SNF              Patient currently is not medically stable to d/c.   Difficult to place patient No       Consultants:  None  Procedures: None Antimicrobials: Rocephin  Subjective: Overnight events noted.  Patient moved to stepdown unit due to temp of 105 and tachycardia and change in mental status.  Seen by urology and plan for OR today.  Objective: Vitals:   01/07/21 1300 01/07/21 1315 01/07/21 1330 01/07/21 1343  BP: (!) 160/73 (!) 152/69    Pulse: 94 95 97   Resp: (!) '21 18 18   '$ Temp:   99 F (37.2 C) 100.2 F (37.9 C)  TempSrc:    Axillary  SpO2: 100% 100% 100%   Weight:      Height:        Intake/Output Summary (Last 24 hours) at 01/07/2021 1406 Last data filed at 01/07/2021 1248 Gross per 24 hour  Intake 2190.2 ml  Output 300 ml  Net 1890.2 ml    [employer]  01/04/21 2217 01/05/21 0257 01/07/21 0400  Weight: 102.1 kg 99.5 kg 105.7 kg    Examination:  General exam: Appears calm and comfortable  Respiratory system: Clear to auscultation. Respiratory effort normal. Cardiovascular system: S1 & S2 heard, RRR. No JVD, murmurs, rubs, gallops or clicks. No pedal edema. Gastrointestinal system: Abdomen is nondistended, soft and nontender. No organomegaly or masses felt. Normal bowel sounds heard. Central nervous system: Periods of confusion noted  extremities: Trace edema bilaterally Skin: No rashes, lesions or ulcers Psychiatry: Judgement and insight appear normal. Mood & affect appropriate.      Data Reviewed: I have personally reviewed following labs and imaging studies  CBC: Recent Labs  Lab 01/04/21 2330 01/05/21 0535 01/06/21 0352 01/07/21 0317  WBC 16.3* 15.3* 12.1* 11.4*  NEUTROABS 13.2*  --  10.1* 10.1*  HGB 13.6 12.7 12.9 12.0  HCT 42.3 38.6 41.5 37.9  MCV 88.9 89.4 91.6 90.0  PLT 211 187 164 127*    Basic Metabolic Panel: Recent Labs  Lab 01/04/21 2330 01/05/21 0535 01/06/21 0352 01/07/21 0317  NA 137 136 138 138  K 4.4 3.8 3.9 3.7  CL 101 103 110 108  CO2 26 23 18* 20*  GLUCOSE 128* 136* 121* 151*  BUN 36* 34* 32* 34*  CREATININE 1.72* 1.62* 1.35* 1.51*  CALCIUM 10.8* 10.5* 10.3 10.5*  MG  --   --  2.1 2.0    GFR: Estimated Creatinine Clearance: 34.6 mL/min (A) (by C-G formula based on SCr of 1.51 mg/dL (H)). Liver Function Tests: Recent Labs  Lab 01/04/21 2330  AST 20  ALT 20  ALKPHOS 91  BILITOT 1.6*  PROT 7.3  ALBUMIN 3.8    No results for input(s): LIPASE, AMYLASE in the last 168 hours. No results for input(s): AMMONIA in the last 168 hours. Coagulation Profile: Recent Labs  Lab 01/05/21 0535 01/06/21 0352 01/07/21 0552  INR 1.4* 1.4* 2.4*    Cardiac Enzymes: No results for input(s): CKTOTAL, CKMB, CKMBINDEX, TROPONINI in the last 168 hours. BNP (last 3 results) No results for input(s): PROBNP in the last 8760 hours. HbA1C: No results for input(s): HGBA1C in the last 72 hours. CBG: No results for input(s): GLUCAP in the last 168 hours. Lipid Profile: No results for input(s): CHOL, HDL, LDLCALC, TRIG, CHOLHDL, LDLDIRECT in the last 72 hours. Thyroid Function Tests: No results for input(s): TSH, T4TOTAL, FREET4, T3FREE, THYROIDAB in the last 72 hours. Anemia Panel: No results for input(s): VITAMINB12, FOLATE, FERRITIN, TIBC, IRON, RETICCTPCT in the last 72 hours. Sepsis Labs: Recent Labs  Lab 01/07/21 0317 01/07/21 0545 01/07/21 0932  LATICACIDVEN 2.0* 2.9* 1.4    Recent Results (from the past 240 hour(s))   Resp Panel by RT-PCR (Flu A&B, Covid) Nasopharyngeal Swab     Status: None   Collection Time: 01/04/21 11:30 PM   Specimen: Nasopharyngeal Swab; Nasopharyngeal(NP) swabs in vial transport medium  Result Value Ref Range Status   SARS Coronavirus 2 by RT PCR NEGATIVE NEGATIVE Final    Comment: (NOTE) SARS-CoV-2 target nucleic acids are NOT DETECTED.  The SARS-CoV-2 RNA is generally detectable in upper respiratory specimens during the acute phase of infection. The lowest concentration of SARS-CoV-2 viral copies this assay can detect is 138 copies/mL. A negative result does not preclude SARS-Cov-2 infection and should not be used as the sole basis for treatment or other patient management decisions. A negative result may occur with  improper specimen collection/handling, submission of specimen other than nasopharyngeal swab, presence of viral mutation(s)  within the areas targeted by this assay, and inadequate number of viral copies(<138 copies/mL). A negative result must be combined with clinical observations, patient history, and epidemiological information. The expected result is Negative.  Fact Sheet for Patients:  EntrepreneurPulse.com.au  Fact Sheet for Healthcare Providers:  IncredibleEmployment.be  This test is no t yet approved or cleared by the [place] FDA and  has been authorized for detection and/or diagnosis of SARS-CoV-2 by FDA under an Emergency Use Authorization (EUA). This EUA will remain  in effect (meaning this test can be used) for the duration of the COVID-19 declaration under Section 564(b)(1) of the Act, 21 U.S.C.section 360bbb-3(b)(1), unless the authorization is terminated  or revoked sooner.       Influenza A by PCR NEGATIVE NEGATIVE Final   Influenza B by PCR NEGATIVE NEGATIVE Final    Comment: (NOTE) The Xpert Xpress SARS-CoV-2/FLU/RSV plus assay is intended as an aid in the diagnosis of influenza from  Nasopharyngeal swab specimens and should not be used as a sole basis for treatment. Nasal washings and aspirates are unacceptable for Xpert Xpress SARS-CoV-2/FLU/RSV testing.  Fact Sheet for Patients: EntrepreneurPulse.com.au  Fact Sheet for Healthcare Providers: IncredibleEmployment.be  This test is not yet approved or cleared by the [place] FDA and has been authorized for detection and/or diagnosis of SARS-CoV-2 by FDA under an Emergency Use Authorization (EUA). This EUA will remain in effect (meaning this test can be used) for the duration of the COVID-19 declaration under Section 564(b)(1) of the Act, 21 U.S.C. section 360bbb-3(b)(1), unless the authorization is terminated or revoked.  Performed at [facility], [place] [address], [place], [place] [address]   MRSA Next Gen by PCR, Nasal     Status: None   Collection Time: 01/07/21  2:31 AM   Specimen: Nasal Mucosa; Nasal Swab  Result Value Ref Range Status   MRSA by PCR Next Gen NOT DETECTED NOT DETECTED Final    Comment: (NOTE) The GeneXpert MRSA Assay (FDA approved for NASAL specimens only), is one component of a comprehensive MRSA colonization surveillance program. It is not intended to diagnose MRSA infection nor to guide or monitor treatment for MRSA infections. Test performance is not FDA approved in patients less than 62 years old. Performed at [facility], [place] [address] [place], [place] [address]   Culture, blood (routine x 2)     Status: None (Preliminary result)   Collection Time: 01/07/21  3:15 AM   Specimen: BLOOD  Result Value Ref Range Status   Specimen Description   Final    BLOOD BLOOD LEFT HAND Performed at [facility] [address] [place], [place] [address]    Special Requests   Final    BOTTLES DRAWN AEROBIC ONLY Blood Culture adequate volume Performed at [facility]  [address], [place], [place] [address]    Culture   Final    NO GROWTH < 12 HOURS Performed at [facility] [address], [place], [place] [address]    Report Status PENDING  Incomplete  Culture, blood (routine x 2)     Status: None (Preliminary result)   Collection Time: 01/07/21  3:15 AM   Specimen: BLOOD  Result Value Ref Range Status   Specimen Description   Final    BLOOD LEFT ANTECUBITAL Performed at [facility] [address], [place], [place] [address]    Special Requests   Final    BOTTLES DRAWN  AEROBIC ONLY Blood Culture adequate volume Performed at [facility] [address], [place], [place] [address]    Culture   Final    NO GROWTH < 12 HOURS Performed at [facility] [address] [place], [place] [address]    Report Status PENDING  Incomplete          Radiology Studies: MR LUMBAR SPINE WO CONTRAST  Result Date: 01/07/2021 CLINICAL DATA:  Initial evaluation for lower back pain. EXAM: MRI LUMBAR SPINE WITHOUT CONTRAST TECHNIQUE: Multiplanar, multisequence MR imaging of the lumbar spine was performed. No intravenous contrast was administered. COMPARISON:  CT from 01/05/2021. FINDINGS: Segmentation: Standard. Lowest well-formed disc space labeled the L5-S1 level. Alignment: Mild scoliosis. Trace 2 mm anterolisthesis of L3 on L4. Straightening of the normal lumbar lordosis elsewhere. Vertebrae: Compression deformity involving the T12 vertebral body with up to 50% central height loss and trace bony retropulsion is chronic in appearance. There is an acute to subacute compression fracture involving the superior endplate of L4 with mild 10% height loss without bony retropulsion. Marrow edema extends into the right L4 pedicle. Additional acute to subacute compression fracture seen involving the superior endplate of L5 with no more than mild 10% height loss without bony retropulsion. These are benign/mechanical in  appearance. Underlying bone marrow signal intensity mildly heterogeneous but within normal limits. No worrisome osseous lesions. No other abnormal marrow edema. Conus medullaris and cauda equina: Conus extends to the T12 level. Conus and cauda equina appear normal. Paraspinal and other soft tissues: Small volume edema/free fluid noted within the presacral space, nonspecific. Paraspinous soft tissues demonstrate no other acute finding. Visualized visceral structures grossly unremarkable. Disc levels: T12-L1: Minimal disc bulge with bony retropulsion related to the chronic T12 compression fracture. Probable superimposed left extraforaminal disc protrusion (series 9, image 9). No significant spinal stenosis. Foramina remain patent. L1-2: Degenerative intervertebral disc space narrowing with diffuse disc bulge. Associated reactive endplate spurring, greater on the right. There is a superimposed right foraminal to extraforaminal disc protrusion contacting the exiting right L1 nerve root (series 9, image 15). Additional tiny left subarticular disc protrusion minimally indents the left ventral thecal sac (series 8, image 15). Mild facet hypertrophy. No significant spinal stenosis. Mild right L1 foraminal narrowing. Left neural foramina remains patent. L2-3: Disc bulge with reactive endplate spurring. Moderate bilateral facet hypertrophy. Resultant mild canal with bilateral lateral recess stenosis. Mild bilateral L2 foraminal narrowing. L3-4: Trace anterolisthesis. Mild disc bulge, eccentric to the left. Superimposed shallow broad-based left foraminal to extraforaminal disc protrusion contacts the exiting left L3 nerve root (series 9, image 27). Moderate facet hypertrophy. Resultant mild canal with bilateral lateral recess stenosis. Mild left L3 foraminal narrowing. Right neural foramen remains patent. L4-5: Disc desiccation with mild disc bulge. Superimposed left subarticular disc protrusion with slight inferior migration  (series 8, image 34). Moderate facet and ligament flavum hypertrophy. Associated small joint effusions. Resultant severe canal with severe left worse than right lateral recess stenosis. Mild bilateral L4 foraminal narrowing. L5-S1: Disc desiccation. Superimposed shallow left extraforaminal disc protrusion closely approximates the exiting left L5 nerve root (series 9, image 39). Moderate facet hypertrophy. No significant spinal stenosis. Foramina remain patent. IMPRESSION: 1. Acute to subacute compression fractures involving the superior endplates of L4 and L5. No more than mild height loss without bony retropulsion. 2. Additional chronic T12 compression fracture. 3. Multifactorial degenerative changes at L4-5 with resultant severe canal with left worse than right lateral recess stenosis. 4. Foraminal to extraforaminal  disc protrusions on the right at L1-2, on the left at L3-4, and on the left at L5-S1, potentially affecting the right L1, left L3, and left L5 nerve roots respectively. 5. Small volume edema/free fluid within the presacral space, nonspecific, and could be reactive in nature. Electronically Signed   By: Jeannine Boga M.D.   On: 01/07/2021 00:18   DG C-Arm 1-60 Min-No Report  Result Date: 01/07/2021 Fluoroscopy was utilized by the requesting physician.  No radiographic interpretation.        Scheduled Meds:  Chlorhexidine Gluconate Cloth  6 each Topical Q0600   furosemide  40 mg Intravenous Once   levalbuterol  0.63 mg Nebulization Once   levalbuterol  0.63 mg Nebulization Q6H   levothyroxine  125 mcg Oral QAC breakfast   mouth rinse  15 mL Mouth Rinse BID   sodium chloride flush  10-40 mL Intracatheter Q12H   Warfarin - Pharmacist Dosing Inpatient   Does not apply q1600   Continuous Infusions:  cefTRIAXone (ROCEPHIN)  IV Stopped (01/07/21 0143)     LOS: 1 day    Time spent: 40 min    Georgette Shell, MD 01/07/2021, 2:06 PM

## 2021-01-07 NOTE — Progress Notes (Addendum)
    OVERNIGHT PROGRESS REPORT  Notified by RN for patient tachycardia and temp. and transfer of patient to SDU for temp of 105F and tachycardia.  Patient is tachy, febrile and Code Sepsis called on transfer to SDU (E-Link) Notified Dr Matilde Sprang (Urology)  Notified Dr Earleen Newport (IR)  Patient will have procedure today. Consult ordered.   Gershon Cull MSNA MSN ACNPC-AG Acute Care Nurse Practitioner [place]

## 2021-01-07 NOTE — Progress Notes (Signed)
NP Olena Heckle made aware of pt HR 160s-170s. Several 12 lead EKGs obtained, the first couple read critical results STEMI, but several after read a fib RVR. NP Olena Heckle aware. HR now 130s-150s. O2 sats 95% on 3L [place]. Last bp 111/ 61 (73). RN will continue to monitor.

## 2021-01-07 NOTE — Consult Note (Signed)
Urology Consult  Referring physician: Gershon Cull Reason for referral: ureter stone and UTI  Chief Complaint: ureter stone and UTI  History of Present Illness: Patient found to be minimally responsive with a temperature of 105.7.  Patient had tachycardia and code sepsis and transferred to intensive care unit.  She has a history of metastatic breast cancer and a DVT on Coumadin.  She was admitted August 9 after a fall.  She had recently been treated for urinary tract infection with antibiotics.  She has been feeling weak and nauseated few days.  She had a urine culture from July 20 for Proteus treated with ceftriaxone.  She was admitted with acute renal failure.  Serum creatinine was 1.7.  It was felt that she had prerenal renal insufficiency due to persistent nausea and poor oral intake.   On August 10 patient serum creatinine is 1.35 with hydration.  She was found acutely early this morning with a high temperature as noted and was sent to the intensive care unit.  No urine cultures since July 20  HR was 160 and developed atrial fibrillation  She had a CT scan August 9 demonstrating moderate to severe right hydronephrosis and perinephric stranding.  She has a 11 mm stone within the kidney.  She has 6 mm stone in the proximal right ureter just beyond the ureteropelvic junction.  Past history of UTI and no stones; chronic vs. More acute right sided pain is nonspecific  When she was acutely ill her blood pressure guide soft when given Cardizem.  In the last 2 hours or so she is looking much better as noted below.   Past Medical History:  Diagnosis Date   Allergy    Anxiety    Arthritis    Blood transfusion without reported diagnosis    Breast cancer ([place]) 07/12/2013   Invasive Mammary Carcinoma   DVT (deep vein thrombosis) in pregnancy    Hypertension    Hypothyroid    Metastatic cancer to bone ([place]) dx'd 12/2016   hip   Personal history of radiation therapy    Pneumonia     Radiation 11/21/13-01/07/14   Right Breast/Supraclavicular   Past Surgical History:  Procedure Laterality Date   BREAST LUMPECTOMY Left 2015   BREAST LUMPECTOMY WITH RADIOACTIVE SEED LOCALIZATION Right 09/09/2013   Procedure: BREAST LUMPECTOMY WITH RADIOACTIVE SEED LOCALIZATION WITH AXILLARY NODE EXCISION;  Surgeon: [name], MD;  Location: [facility];  Service: General;  Laterality: Right;   DENTAL SURGERY  04/19/2012   13 TEETH REMOVED   DILATION AND CURETTAGE OF UTERUS     ORIF PERIPROSTHETIC FRACTURE Left 01/31/2017   Procedure: REVISION and OPEN REDUCTION INTERNAL FIXATION (ORIF) PERIPROSTHETIC FRACTURE LEFT HIP;  Surgeon: Paralee Cancel, MD;  Location: [facility];  Service: Orthopedics;  Laterality: Left;  120 mins   RE-EXCISION OF BREAST LUMPECTOMY Right 09/24/2013   Procedure: RE-EXCISION OF RIGHT BREAST LUMPECTOMY;  Surgeon: [name], MD;  Location: [facility];  Service: General;  Laterality: Right;   TOTAL HIP ARTHROPLASTY Left 01/16/2017   Procedure: TOTAL HIP ARTHROPLASTY POSTERIOR;  Surgeon: Paralee Cancel, MD;  Location[facility];  Service: Orthopedics;  Laterality: Left;    Medications: I have reviewed the patient's current medications. Allergies:  Allergies  Allergen Reactions   Anesthetics, Amide Hypertension   Benadryl [Diphenhydramine Hcl] Other (See Comments)    Dizziness   Carbocaine [Mepivacaine Hcl] Hypertension   Codeine Other (See Comments)    Dizziness   Epinephrine Hypertension  Sulfa Antibiotics Other (See Comments)    dizziness   Diphenhydramine    Latex Other (See Comments)    Blisters in mouth   Penicillins Rash    Has patient had a PCN reaction causing immediate rash, facial/tongue/throat swelling, SOB or lightheadedness with hypotension: Unknown Has patient had a PCN reaction causing severe rash involving mucus membranes or skin necrosis: Unknown Has patient had a PCN reaction that required hospitalization:  Unknown Has patient had a PCN reaction occurring within the last 10 years: Unknown If all of the above answers are "NO", then may proceed with Cephalosporin use.    Tramadol Other (See Comments)    Sedation.     Family History  Problem Relation Age of Onset   Heart disease Brother    Colon cancer Brother    Prostate cancer Brother    Social History:  reports that she has never smoked. She has never used smokeless tobacco. She reports that she does not drink alcohol and does not use drugs.  ROS: All systems are reviewed and negative except as noted. Rest negative  Physical Exam:  Vital signs in last 24 hours: Temp:  [98.6 F (37 C)-105.6 F (40.9 C)] 99.5 F (37.5 C) (08/11 0400) Pulse Rate:  [86-148] 121 (08/11 0608) Resp:  [23-35] 24 (08/11 0608) BP: (105-198)/(41-148) 124/41 (08/11 0608) SpO2:  [94 %-100 %] 100 % (08/11 0608) Weight:  [105.7 kg] 105.7 kg (08/11 0400)  Cardiovascular: Skin warm; not flushed Respiratory: Breaths quiet; no shortness of breath Abdomen: No masses Neurological: Normal sensation to touch Musculoskeletal: Normal motor function arms and legs Lymphatics: No inguinal adenopathy Skin: No rashes Genitourinary:bit drowsy  Laboratory Data:  Results for orders placed or performed during the hospital encounter of 01/04/21 (from the past 72 hour(s))  Comprehensive metabolic panel     Status: Abnormal   Collection Time: 01/04/21 11:30 PM  Result Value Ref Range   Sodium 137 135 - 145 mmol/L   Potassium 4.4 3.5 - 5.1 mmol/L   Chloride 101 98 - 111 mmol/L   CO2 26 22 - 32 mmol/L   Glucose, Bld 128 (H) 70 - 99 mg/dL    Comment: Glucose reference range applies only to samples taken after fasting for at least 8 hours.   BUN 36 (H) 8 - 23 mg/dL   Creatinine, Ser 1.72 (H) 0.44 - 1.00 mg/dL   Calcium 10.8 (H) 8.9 - 10.3 mg/dL   Total Protein 7.3 6.5 - 8.1 g/dL   Albumin 3.8 3.5 - 5.0 g/dL   AST 20 15 - 41 U/L   ALT 20 0 - 44 U/L   Alkaline  Phosphatase 91 38 - 126 U/L   Total Bilirubin 1.6 (H) 0.3 - 1.2 mg/dL   GFR, Estimated 30 (L) >60 mL/min    Comment: (NOTE) Calculated using the CKD-EPI Creatinine Equation (2021)    Anion gap 10 5 - 15    Comment: Performed at [facility], [place] [address], [place], [place] [address]  CBC with Differential     Status: Abnormal   Collection Time: 01/04/21 11:30 PM  Result Value Ref Range   WBC 16.3 (H) 4.0 - 10.5 K/uL   RBC 4.76 3.87 - 5.11 MIL/uL   Hemoglobin 13.6 12.0 - 15.0 g/dL   HCT 42.3 36.0 - 46.0 %   MCV 88.9 80.0 - 100.0 fL   MCH 28.6 26.0 - 34.0 pg   MCHC 32.2 30.0 - 36.0 g/dL   RDW 16.2 (H) 11.5 -  15.5 %   Platelets 211 150 - 400 K/uL   nRBC 0.0 0.0 - 0.2 %   Neutrophils Relative % 81 %   Neutro Abs 13.2 (H) 1.7 - 7.7 K/uL   Lymphocytes Relative 5 %   Lymphs Abs 0.8 0.7 - 4.0 K/uL   Monocytes Relative 14 %   Monocytes Absolute 2.2 (H) 0.1 - 1.0 K/uL   Eosinophils Relative 0 %   Eosinophils Absolute 0.0 0.0 - 0.5 K/uL   Basophils Relative 0 %   Basophils Absolute 0.0 0.0 - 0.1 K/uL   Immature Granulocytes 0 %   Abs Immature Granulocytes 0.06 0.00 - 0.07 K/uL    Comment: Performed at [facility], [place] [address] [place], [place] [address]  Resp Panel by RT-PCR (Flu A&B, Covid) Nasopharyngeal Swab     Status: None   Collection Time: 01/04/21 11:30 PM   Specimen: Nasopharyngeal Swab; Nasopharyngeal(NP) swabs in vial transport medium  Result Value Ref Range   SARS Coronavirus 2 by RT PCR NEGATIVE NEGATIVE    Comment: (NOTE) SARS-CoV-2 target nucleic acids are NOT DETECTED.  The SARS-CoV-2 RNA is generally detectable in upper respiratory specimens during the acute phase of infection. The lowest concentration of SARS-CoV-2 viral copies this assay can detect is 138 copies/mL. A negative result does not preclude SARS-Cov-2 infection and should not be used as the sole basis for treatment or other patient management decisions. A  negative result may occur with  improper specimen collection/handling, submission of specimen other than nasopharyngeal swab, presence of viral mutation(s) within the areas targeted by this assay, and inadequate number of viral copies(<138 copies/mL). A negative result must be combined with clinical observations, patient history, and epidemiological information. The expected result is Negative.  Fact Sheet for Patients:  EntrepreneurPulse.com.au  Fact Sheet for Healthcare Providers:  IncredibleEmployment.be  This test is no t yet approved or cleared by the [place] FDA and  has been authorized for detection and/or diagnosis of SARS-CoV-2 by FDA under an Emergency Use Authorization (EUA). This EUA will remain  in effect (meaning this test can be used) for the duration of the COVID-19 declaration under Section 564(b)(1) of the Act, 21 U.S.C.section 360bbb-3(b)(1), unless the authorization is terminated  or revoked sooner.       Influenza A by PCR NEGATIVE NEGATIVE   Influenza B by PCR NEGATIVE NEGATIVE    Comment: (NOTE) The Xpert Xpress SARS-CoV-2/FLU/RSV plus assay is intended as an aid in the diagnosis of influenza from Nasopharyngeal swab specimens and should not be used as a sole basis for treatment. Nasal washings and aspirates are unacceptable for Xpert Xpress SARS-CoV-2/FLU/RSV testing.  Fact Sheet for Patients: EntrepreneurPulse.com.au  Fact Sheet for Healthcare Providers: IncredibleEmployment.be  This test is not yet approved or cleared by the [place] FDA and has been authorized for detection and/or diagnosis of SARS-CoV-2 by FDA under an Emergency Use Authorization (EUA). This EUA will remain in effect (meaning this test can be used) for the duration of the COVID-19 declaration under Section 564(b)(1) of the Act, 21 U.S.C. section 360bbb-3(b)(1), unless the authorization is terminated  or revoked.  Performed at [facility], [place] [address] [place], [place] [address]   Urinalysis, Routine w reflex microscopic Urine, In & Out Cath     Status: Abnormal   Collection Time: 01/05/21 12:30 AM  Result Value Ref Range   Color, Urine YELLOW YELLOW   APPearance HAZY (A) CLEAR   Specific Gravity, Urine 1.024 1.005 -  1.030   pH 5.0 5.0 - 8.0   Glucose, UA NEGATIVE NEGATIVE mg/dL   Hgb urine dipstick NEGATIVE NEGATIVE   Bilirubin Urine NEGATIVE NEGATIVE   Ketones, ur 5 (A) NEGATIVE mg/dL   Protein, ur 100 (A) NEGATIVE mg/dL   Nitrite NEGATIVE NEGATIVE   Leukocytes,Ua LARGE (A) NEGATIVE   RBC / HPF 6-10 0 - 5 RBC/hpf   WBC, UA >50 (H) 0 - 5 WBC/hpf   Bacteria, UA MANY (A) NONE SEEN   Squamous Epithelial / LPF 0-5 0 - 5   Mucus PRESENT     Comment: Performed at [facility], [place] [address] [place], [place] [id number]  Basic metabolic panel     Status: Abnormal   Collection Time: 01/05/21  5:35 AM  Result Value Ref Range   Sodium 136 135 - 145 mmol/L   Potassium 3.8 3.5 - 5.1 mmol/L   Chloride 103 98 - 111 mmol/L   CO2 23 22 - 32 mmol/L   Glucose, Bld 136 (H) 70 - 99 mg/dL    Comment: Glucose reference range applies only to samples taken after fasting for at least 8 hours.   BUN 34 (H) 8 - 23 mg/dL   Creatinine, Ser 1.62 (H) 0.44 - 1.00 mg/dL   Calcium 10.5 (H) 8.9 - 10.3 mg/dL   GFR, Estimated 32 (L) >60 mL/min    Comment: (NOTE) Calculated using the CKD-EPI Creatinine Equation (2021)    Anion gap 10 5 - 15    Comment: Performed at [facility], [place] [address], [place], [place] [address]  CBC     Status: Abnormal   Collection Time: 01/05/21  5:35 AM  Result Value Ref Range   WBC 15.3 (H) 4.0 - 10.5 K/uL   RBC 4.32 3.87 - 5.11 MIL/uL   Hemoglobin 12.7 12.0 - 15.0 g/dL   HCT 38.6 36.0 - 46.0 %   MCV 89.4 80.0 - 100.0 fL   MCH 29.4 26.0 - 34.0 pg   MCHC 32.9 30.0 - 36.0 g/dL   RDW 16.4 (H) 11.5 - 15.5 %    Platelets 187 150 - 400 K/uL   nRBC 0.0 0.0 - 0.2 %    Comment: Performed at [facility], [place] [address] [place], [place] [address]  Protime-INR     Status: Abnormal   Collection Time: 01/05/21  5:35 AM  Result Value Ref Range   Prothrombin Time 17.0 (H) 11.4 - 15.2 seconds   INR 1.4 (H) 0.8 - 1.2    Comment: (NOTE) INR goal varies based on device and disease states. Performed at [facility], [place] [address] [place], [place] [address]   Protime-INR     Status: Abnormal   Collection Time: 01/06/21  3:52 AM  Result Value Ref Range   Prothrombin Time 17.3 (H) 11.4 - 15.2 seconds   INR 1.4 (H) 0.8 - 1.2    Comment: (NOTE) INR goal varies based on device and disease states. Performed at [facility], [place] [address] [place], [place] [id number]   Basic metabolic panel     Status: Abnormal   Collection Time: 01/06/21  3:52 AM  Result Value Ref Range   Sodium 138 135 - 145 mmol/L   Potassium 3.9 3.5 - 5.1 mmol/L   Chloride 110 98 - 111 mmol/L   CO2 18 (L) 22 - 32 mmol/L   Glucose, Bld 121 (H) 70 - 99 mg/dL    Comment: Glucose reference  range applies only to samples taken after fasting for at least 8 hours.   BUN 32 (H) 8 - 23 mg/dL   Creatinine, Ser 1.35 (H) 0.44 - 1.00 mg/dL   Calcium 10.3 8.9 - 10.3 mg/dL   GFR, Estimated 39 (L) >60 mL/min    Comment: (NOTE) Calculated using the CKD-EPI Creatinine Equation (2021)    Anion gap 10 5 - 15    Comment: Performed at [facility], [place] [address], [place], [place] [address]  CBC with Differential/Platelet     Status: Abnormal   Collection Time: 01/06/21  3:52 AM  Result Value Ref Range   WBC 12.1 (H) 4.0 - 10.5 K/uL   RBC 4.53 3.87 - 5.11 MIL/uL   Hemoglobin 12.9 12.0 - 15.0 g/dL   HCT 41.5 36.0 - 46.0 %   MCV 91.6 80.0 - 100.0 fL   MCH 28.5 26.0 - 34.0 pg   MCHC 31.1 30.0 - 36.0 g/dL   RDW 16.5 (H) 11.5 - 15.5 %   Platelets 164 150 - 400 K/uL   nRBC  0.0 0.0 - 0.2 %   Neutrophils Relative % 85 %   Neutro Abs 10.1 (H) 1.7 - 7.7 K/uL   Lymphocytes Relative 5 %   Lymphs Abs 0.6 (L) 0.7 - 4.0 K/uL   Monocytes Relative 10 %   Monocytes Absolute 1.3 (H) 0.1 - 1.0 K/uL   Eosinophils Relative 0 %   Eosinophils Absolute 0.0 0.0 - 0.5 K/uL   Basophils Relative 0 %   Basophils Absolute 0.0 0.0 - 0.1 K/uL   Immature Granulocytes 0 %   Abs Immature Granulocytes 0.03 0.00 - 0.07 K/uL    Comment: Performed at [facility] [place] [address] [place], [place] [address]  Magnesium     Status: None   Collection Time: 01/06/21  3:52 AM  Result Value Ref Range   Magnesium 2.1 1.7 - 2.4 mg/dL    Comment: Performed at [facility], [place] [address], [place], [place] [address]  MRSA Next Gen by PCR, Nasal     Status: None   Collection Time: 01/07/21  2:31 AM   Specimen: Nasal Mucosa; Nasal Swab  Result Value Ref Range   MRSA by PCR Next Gen NOT DETECTED NOT DETECTED    Comment: (NOTE) The GeneXpert MRSA Assay (FDA approved for NASAL specimens only), is one component of a comprehensive MRSA colonization surveillance program. It is not intended to diagnose MRSA infection nor to guide or monitor treatment for MRSA infections. Test performance is not FDA approved in patients less than 45 years old. Performed at [facility], [place] [address], [place], [place] [id number]   Basic metabolic panel     Status: Abnormal   Collection Time: 01/07/21  3:17 AM  Result Value Ref Range   Sodium 138 135 - 145 mmol/L   Potassium 3.7 3.5 - 5.1 mmol/L   Chloride 108 98 - 111 mmol/L   CO2 20 (L) 22 - 32 mmol/L   Glucose, Bld 151 (H) 70 - 99 mg/dL    Comment: Glucose reference range applies only to samples taken after fasting for at least 8 hours.   BUN 34 (H) 8 - 23 mg/dL   Creatinine, Ser 1.51 (H) 0.44 - 1.00 mg/dL   Calcium 10.5 (H) 8.9 - 10.3 mg/dL   GFR, Estimated 35 (L) >60 mL/min    Comment:  (NOTE) Calculated using the CKD-EPI Creatinine Equation (2021)  Anion gap 10 5 - 15    Comment: Performed at [facility], [place] [address], [place], [place] [address]  CBC with Differential/Platelet     Status: Abnormal   Collection Time: 01/07/21  3:17 AM  Result Value Ref Range   WBC 11.4 (H) 4.0 - 10.5 K/uL   RBC 4.21 3.87 - 5.11 MIL/uL   Hemoglobin 12.0 12.0 - 15.0 g/dL   HCT 37.9 36.0 - 46.0 %   MCV 90.0 80.0 - 100.0 fL   MCH 28.5 26.0 - 34.0 pg   MCHC 31.7 30.0 - 36.0 g/dL   RDW 16.6 (H) 11.5 - 15.5 %   Platelets 127 (L) 150 - 400 K/uL    Comment: SPECIMEN CHECKED FOR CLOTS CONSISTENT WITH PREVIOUS RESULT REPEATED TO VERIFY    nRBC 0.0 0.0 - 0.2 %   Neutrophils Relative % 88 %   Neutro Abs 10.1 (H) 1.7 - 7.7 K/uL   Lymphocytes Relative 3 %   Lymphs Abs 0.3 (L) 0.7 - 4.0 K/uL   Monocytes Relative 8 %   Monocytes Absolute 0.9 0.1 - 1.0 K/uL   Eosinophils Relative 0 %   Eosinophils Absolute 0.0 0.0 - 0.5 K/uL   Basophils Relative 0 %   Basophils Absolute 0.0 0.0 - 0.1 K/uL   Immature Granulocytes 1 %   Abs Immature Granulocytes 0.12 (H) 0.00 - 0.07 K/uL    Comment: Performed at [facility], [place] [address] [place], [place] [address]  Magnesium     Status: None   Collection Time: 01/07/21  3:17 AM  Result Value Ref Range   Magnesium 2.0 1.7 - 2.4 mg/dL    Comment: Performed at [facility], [place] [address], [place], [name] [address]  Lactic acid, plasma     Status: Abnormal   Collection Time: 01/07/21  3:17 AM  Result Value Ref Range   Lactic Acid, Venous 2.0 (HH) 0.5 - 1.9 mmol/L    Comment: CRITICAL RESULT CALLED TO, READ BACK BY AND VERIFIED WITH: JENNA BURDICK @ 0400 ON 01/07/21 C VARNER Performed at [facility], [place] [address] [place], [name] [address]   Lactic acid, plasma     Status: Abnormal   Collection Time: 01/07/21  5:45 AM  Result Value Ref Range   Lactic Acid, Venous 2.9  (HH) 0.5 - 1.9 mmol/L    Comment: CRITICAL VALUE NOTED.  VALUE IS CONSISTENT WITH PREVIOUSLY REPORTED AND CALLED VALUE. Performed at [facility], [place] [address] [place], [place] [address]   Protime-INR     Status: Abnormal   Collection Time: 01/07/21  5:52 AM  Result Value Ref Range   Prothrombin Time 26.1 (H) 11.4 - 15.2 seconds   INR 2.4 (H) 0.8 - 1.2    Comment: (NOTE) INR goal varies based on device and disease states. Performed at [facility], [place] [address] [place], [place] [address]    *Note: Due to a large number of results and/or encounters for the requested time period, some results have not been displayed. A complete set of results can be found in Results Review.   Recent Results (from the past 240 hour(s))  Resp Panel by RT-PCR (Flu A&B, Covid) Nasopharyngeal Swab     Status: None   Collection Time: 01/04/21 11:30 PM   Specimen: Nasopharyngeal Swab; Nasopharyngeal(NP) swabs in vial transport medium  Result Value Ref Range Status   SARS Coronavirus 2 by RT PCR NEGATIVE NEGATIVE Final  Comment: (NOTE) SARS-CoV-2 target nucleic acids are NOT DETECTED.  The SARS-CoV-2 RNA is generally detectable in upper respiratory specimens during the acute phase of infection. The lowest concentration of SARS-CoV-2 viral copies this assay can detect is 138 copies/mL. A negative result does not preclude SARS-Cov-2 infection and should not be used as the sole basis for treatment or other patient management decisions. A negative result may occur with  improper specimen collection/handling, submission of specimen other than nasopharyngeal swab, presence of viral mutation(s) within the areas targeted by this assay, and inadequate number of viral copies(<138 copies/mL). A negative result must be combined with clinical observations, patient history, and epidemiological information. The expected result is Negative.  Fact Sheet for Patients:   EntrepreneurPulse.com.au  Fact Sheet for Healthcare Providers:  IncredibleEmployment.be  This test is no t yet approved or cleared by the [place] FDA and  has been authorized for detection and/or diagnosis of SARS-CoV-2 by FDA under an Emergency Use Authorization (EUA). This EUA will remain  in effect (meaning this test can be used) for the duration of the COVID-19 declaration under Section 564(b)(1) of the Act, 21 U.S.C.section 360bbb-3(b)(1), unless the authorization is terminated  or revoked sooner.       Influenza A by PCR NEGATIVE NEGATIVE Final   Influenza B by PCR NEGATIVE NEGATIVE Final    Comment: (NOTE) The Xpert Xpress SARS-CoV-2/FLU/RSV plus assay is intended as an aid in the diagnosis of influenza from Nasopharyngeal swab specimens and should not be used as a sole basis for treatment. Nasal washings and aspirates are unacceptable for Xpert Xpress SARS-CoV-2/FLU/RSV testing.  Fact Sheet for Patients: EntrepreneurPulse.com.au  Fact Sheet for Healthcare Providers: IncredibleEmployment.be  This test is not yet approved or cleared by the [place] FDA and has been authorized for detection and/or diagnosis of SARS-CoV-2 by FDA under an Emergency Use Authorization (EUA). This EUA will remain in effect (meaning this test can be used) for the duration of the COVID-19 declaration under Section 564(b)(1) of the Act, 21 U.S.C. section 360bbb-3(b)(1), unless the authorization is terminated or revoked.  Performed at [facility], [place] [address] [place], [place] [address]   MRSA Next Gen by PCR, Nasal     Status: None   Collection Time: 01/07/21  2:31 AM   Specimen: Nasal Mucosa; Nasal Swab  Result Value Ref Range Status   MRSA by PCR Next Gen NOT DETECTED NOT DETECTED Final    Comment: (NOTE) The GeneXpert MRSA Assay (FDA approved for NASAL specimens only), is one  component of a comprehensive MRSA colonization surveillance program. It is not intended to diagnose MRSA infection nor to guide or monitor treatment for MRSA infections. Test performance is not FDA approved in patients less than 13 years old. Performed at [facility], [place] [address], [place], [place] [address]    Creatinine: Recent Labs    01/04/21 2330 01/05/21 0535 01/06/21 0352 01/07/21 0317  CREATININE 1.72* 1.62* 1.35* 1.51*    Xrays: See report/chart As noted  Impression/Assessment:  The patient has a right ureteral stone and likely has urinary sepsis.  In the last 3 hours she is stabilized.  Heart rate is now below 100.  Her atrial fibrillation was persisting but a few months ago she developed sinus rhythm.  It is difficult to say she still having pain.  She is more alert.  She is afebrile.  Plan:  I drew a picture and went over a stent in full detail with pros cons  risks and sequelae including percutaneous tube.  I felt a stent was a better procedure under the circumstances.  Interventional radiology had not yet assessed the patient.  I will also get consent from the patient's son.  I felt that a stent was better for the patient know that she is much more stable and she is also on Coumadin which was complicated percutaneous tube.  Son was called and pending to speak with him  Nicki Reaper A Jaelen Gellerman 01/07/2021, 7:04 AM

## 2021-01-07 NOTE — Progress Notes (Signed)
Sepsis tracking by eLINK 

## 2021-01-07 NOTE — Transfer of Care (Signed)
Immediate Anesthesia Transfer of Care Note  Patient: Alicia Vasquez  Procedure(s) Performed: CYSTOSCOPY WITH RETROGRADE PYELOGRAM/URETERAL STENT PLACEMENT (Right)  Patient Location: PACU  Anesthesia Type:General  Level of Consciousness: awake, alert  and oriented  Airway & Oxygen Therapy: Patient Spontanous Breathing and Patient connected to face mask oxygen  Post-op Assessment: Report given to RN and Post -op Vital signs reviewed and stable  Post vital signs: Reviewed and stable  Last Vitals:  Vitals Value Taken Time  BP 142/82 01/07/21 1245  Temp    Pulse    Resp 22 01/07/21 1248  SpO2    Vitals shown include unvalidated device data.  Last Pain:  Vitals:   01/07/21 1111  TempSrc: Oral  PainSc:       Patients Stated Pain Goal: 0 ([id number])  Complications: No notable events documented.

## 2021-01-07 NOTE — Progress Notes (Signed)
    OVERNIGHT PROGRESS REPORT  Patient is now normothermic  SPO2 at 100% on 3 LPM [place] HR 86 BP 124/41 (69) Awake and mentating   Gershon Cull MSNA MSN ACNPC-AG Acute Care Nurse Practitioner [place]

## 2021-01-07 NOTE — Progress Notes (Signed)
PT Cancellation Note  Patient Details Name: Alicia Vasquez MRN: [id number] DOB: [birth year]   Cancelled Treatment:    Reason Eval/Treat Not Completed: Patient at procedure or test/unavailable (pt off unit for a procedure. Will follow.)  Philomena Doheny PT 01/07/2021  Acute Rehabilitation Services Pager [phone] Office [phone]

## 2021-01-08 LAB — BASIC METABOLIC PANEL
Anion gap: 10 (ref 5–15)
BUN: 38 mg/dL — ABNORMAL HIGH (ref 8–23)
CO2: 22 mmol/L (ref 22–32)
Calcium: 10.3 mg/dL (ref 8.9–10.3)
Chloride: 107 mmol/L (ref 98–111)
Creatinine, Ser: 1.28 mg/dL — ABNORMAL HIGH (ref 0.44–1.00)
GFR, Estimated: 42 mL/min — ABNORMAL LOW (ref >60)
Glucose, Bld: 150 mg/dL — ABNORMAL HIGH (ref 70–99)
Potassium: 4.1 mmol/L (ref 3.5–5.1)
Sodium: 139 mmol/L (ref 135–145)

## 2021-01-08 LAB — CBC WITH DIFFERENTIAL/PLATELET
Abs Immature Granulocytes: 0.15 10*3/uL — ABNORMAL HIGH (ref 0.00–0.07)
Basophils Absolute: 0 10*3/uL (ref 0.0–0.1)
Basophils Relative: 0 %
Eosinophils Absolute: 0 10*3/uL (ref 0.0–0.5)
Eosinophils Relative: 0 %
HCT: 33.7 % — ABNORMAL LOW (ref 36.0–46.0)
Hemoglobin: 10.4 g/dL — ABNORMAL LOW (ref 12.0–15.0)
Immature Granulocytes: 1 %
Lymphocytes Relative: 4 %
Lymphs Abs: 0.6 10*3/uL — ABNORMAL LOW (ref 0.7–4.0)
MCH: 28 pg (ref 26.0–34.0)
MCHC: 30.9 g/dL (ref 30.0–36.0)
MCV: 90.6 fL (ref 80.0–100.0)
Monocytes Absolute: 1.2 10*3/uL — ABNORMAL HIGH (ref 0.1–1.0)
Monocytes Relative: 8 %
Neutro Abs: 13.7 10*3/uL — ABNORMAL HIGH (ref 1.7–7.7)
Neutrophils Relative %: 87 %
Platelets: 96 10*3/uL — ABNORMAL LOW (ref 150–400)
RBC: 3.72 MIL/uL — ABNORMAL LOW (ref 3.87–5.11)
RDW: 17 % — ABNORMAL HIGH (ref 11.5–15.5)
WBC: 15.7 10*3/uL — ABNORMAL HIGH (ref 4.0–10.5)
nRBC: 0 % (ref 0.0–0.2)

## 2021-01-08 LAB — PROTIME-INR
INR: 4.3 (ref 0.8–1.2)
Prothrombin Time: 41.6 seconds — ABNORMAL HIGH (ref 11.4–15.2)

## 2021-01-08 LAB — OCCULT BLOOD X 1 CARD TO LAB, STOOL: Fecal Occult Bld: NEGATIVE

## 2021-01-08 LAB — MAGNESIUM: Magnesium: 2.2 mg/dL (ref 1.7–2.4)

## 2021-01-08 MED ORDER — LEVALBUTEROL HCL 0.63 MG/3ML IN NEBU
0.6300 mg | INHALATION_SOLUTION | Freq: Two times a day (BID) | RESPIRATORY_TRACT | Status: DC
  Administered 2021-01-08: 0.63 mg via RESPIRATORY_TRACT
  Filled 2021-01-08 (×2): qty 3

## 2021-01-08 NOTE — Anesthesia Postprocedure Evaluation (Signed)
Anesthesia Post Note  Patient: Alicia Vasquez  Procedure(s) Performed: CYSTOSCOPY WITH RETROGRADE PYELOGRAM/URETERAL STENT PLACEMENT (Right)     Patient location during evaluation: PACU Anesthesia Type: General Level of consciousness: awake and alert Pain management: pain level controlled Vital Signs Assessment: post-procedure vital signs reviewed and stable Respiratory status: spontaneous breathing, nonlabored ventilation, respiratory function stable and patient connected to nasal cannula oxygen Cardiovascular status: blood pressure returned to baseline and stable Postop Assessment: no apparent nausea or vomiting Anesthetic complications: no   No notable events documented.  Last Vitals:  Vitals:   01/08/21 1941 01/08/21 2000  BP:  133/60  Pulse:  80  Resp:  (!) 22  Temp:    SpO2: 96% 92%    Last Pain:  Vitals:   01/08/21 2000  TempSrc:   PainSc: 0-No pain                 Prabhnoor Ellenberger S

## 2021-01-08 NOTE — Progress Notes (Signed)
[facility] Indication: DVT  Patient Measurements: Height: '5\' 4"'$  (162.6 cm) Weight: 105.7 kg (233 lb 0.4 oz) IBW/kg (Calculated) : 54.7  Vital Signs: Temp: 97.7 F (36.5 C) (08/12 0734) Temp Source: Oral (08/12 0734) BP: 166/55 (08/12 0800) Pulse Rate: 70 (08/12 0800)  Labs: Recent Labs    01/06/21 0352 01/07/21 0317 01/07/21 0552 01/08/21 0517  HGB 12.9 12.0  --  10.4*  HCT 41.5 37.9  --  33.7*  PLT 164 127*  --  96*  LABPROT 17.3*  --  26.1* 41.6*  INR 1.4*  --  2.4* 4.3*  CREATININE 1.35* 1.51*  --  1.28*     Estimated Creatinine Clearance: 40.9 mL/min (A) (by C-G formula based on SCr of 1.28 mg/dL (H)).   Medications:  PTA Warfarin '5mg'$  daily except 7.'5mg'$  MWF- dosed by Velora Heckler PCP per med rec but Rx sent by Dr Jana Hakim of Onc  Assessment: 80 yr female brought to ED s/p fall. PMH significant for breast cancer, hx DVT (on warfarin). Reported last dose taken 01/04/21 per med rec/son but per ED RN note pt stated she did not take it 8/7 or 8/8. Admit INR was subtherapeutic at 1.4. CT head on admission: No acute abnormality.  Pt underwent cystoscopy with retrograde pyelogram/ureteral stent placement on 8/11.   Today, 01/08/2021:   INR = 4.3 is SUPRAtherapeutic, large increase over past 48 hours CBC: Hgb (10.4) low and decreased; Plt (96) low and trending down Discussed elevated INR w/MDs and RN. No signs of bleeding, urine clear. Per RN admit note pt does have small hematoma on R side of head from fall.   No major DDI. Ceftriaxone does have some potential to enhance effects of INR.  Goal of Therapy:  INR 2-3  Plan:  HOLD warfarin Recheck INR, CBC with AM labs tomorrow Monitor for signs of bleeding  Lenis Noon, PharmD 01/08/21 10:11 AM

## 2021-01-08 NOTE — Progress Notes (Signed)
PROGRESS NOTE    Alicia Vasquez  [id number] DOB: 12/22/1940 DOA: 01/04/2021 PCP: [name], MD   Brief Narrative: 80 year old female lives at home with her friend.  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  Patient has been having nausea vomiting decreased p.o. intake.  She tripped and fell and hit her head did not lose consciousness.  CT of the head and spine unremarkable.  She was found to be in AKI with leukocytosis and UA consistent with UTI.  Patient was started on Rocephin.  Her prior urine culture grew Proteus Mirabella's sensitive to Rocephin.  She is being admitted for AKI and UTI.  She was seen by physical therapy recommending patient needs SNF.  Assessment & Plan:   Principal Problem:   ARF (acute renal failure) (HCC) Active Problems:   Hypothyroid   HTN (hypertension)   Primary hypercoagulable state ([place])   DVT, lower extremity ([place])   AKI (acute kidney injury) ([place])    #1 AKI secondary to obstructive uropathy and dehydration decreased p.o. intake.Creatinine 1.28 from 1.51.  Improving   #2 urinary tract infection UA consistent with UTI CT shows obstructive renal stone on Rocephin  Still with leukocytosis Status post stent in the right kidney 01/07/2021 Foley in place urology recommends to keep the Foley in place until she has been afebrile for at least 24 hours. Urine culture has been negative.   #3 DVT on Coumadin dosing by pharmacy.  INR 4.3 Coumadin to be held recheck INR in AM.  #4 hypothyroidism on Synthroid  #5 metastatic breast cancer followed by oncology  #6 history of hyperparathyroidism followed by endocrinology  #7 severe deconditioning and generalized weakness PT consulted recommending SNF.  #8 lumbar compression fracture -MRI lumbar spine acute to subacute compression fracture involving the superior endplate of L4 and L5.  Chronic  T12 compression fracture.  Will refer her to interventional radiology upon discharge.  #9 anemia hemoglobin 10.4 from 12.0.  Possible hemodilution as she was getting IV fluids for dehydration.  No evidence of active bleeding or gross bleeding noted.  Will check FOBT.   Estimated body mass index is 40 kg/m as calculated from the following:   Height as of this encounter: '5\' 4"'$  (1.626 m).   Weight as of this encounter: 105.7 kg.  DVT prophylaxis: Coumadin  code Status: Full code Family Communication: None at bedside Disposition Plan:  Status is: Inpatient  Remains inpatient appropriate because:IV treatments appropriate due to intensity of illness or inability to take PO  Dispo: The patient is from: Home              Anticipated d/c is to: SNF              Patient currently is not medically stable to d/c.   Difficult to place patient No       Consultants:  None  Procedures: None Antimicrobials: Rocephin  Subjective: Patient is sitting up in chair Foley in place more awake alert answering questions appropriately she reports she is feeling better  Objective: Vitals:   01/08/21 0700 01/08/21 0734 01/08/21 0745 01/08/21 0800  BP: (!) 158/65   (!) 166/55  Pulse: 75   70  Resp: 15   16  Temp:  97.7 F (36.5 C)    TempSrc:  Oral    SpO2: 93%  94% 92%  Weight:      Height:  Intake/Output Summary (Last 24 hours) at 01/08/2021 1258 Last data filed at 01/08/2021 0852 Gross per 24 hour  Intake 135.04 ml  Output 1010 ml  Net -874.96 ml    Filed Weights   01/04/21 2217 01/05/21 0257 01/07/21 0400  Weight: 102.1 kg 99.5 kg 105.7 kg    Examination:  General exam: Appears calm and comfortable  Respiratory system: Clear to auscultation. Respiratory effort normal. Cardiovascular system: S1 & S2 heard, RRR. No JVD, murmurs, rubs, gallops or clicks. No pedal edema. Gastrointestinal system: Abdomen is distended, soft and nontender. No organomegaly or masses felt. Normal  bowel sounds heard. Central nervous system: Awake alert and oriented extremities: Trace edema bilaterally Skin: No rashes, lesions or ulcers Psychiatry: Judgement and insight appear normal. Mood & affect appropriate.     Data Reviewed: I have personally reviewed following labs and imaging studies  CBC: Recent Labs  Lab 01/04/21 2330 01/05/21 0535 01/06/21 0352 01/07/21 0317 01/08/21 0517  WBC 16.3* 15.3* 12.1* 11.4* 15.7*  NEUTROABS 13.2*  --  10.1* 10.1* 13.7*  HGB 13.6 12.7 12.9 12.0 10.4*  HCT 42.3 38.6 41.5 37.9 33.7*  MCV 88.9 89.4 91.6 90.0 90.6  PLT 211 187 164 127* 96*    Basic Metabolic Panel: Recent Labs  Lab 01/04/21 2330 01/05/21 0535 01/06/21 0352 01/07/21 0317 01/08/21 0517  NA 137 136 138 138 139  K 4.4 3.8 3.9 3.7 4.1  CL 101 103 110 108 107  CO2 26 23 18* 20* 22  GLUCOSE 128* 136* 121* 151* 150*  BUN 36* 34* 32* 34* 38*  CREATININE 1.72* 1.62* 1.35* 1.51* 1.28*  CALCIUM 10.8* 10.5* 10.3 10.5* 10.3  MG  --   --  2.1 2.0 2.2    GFR: Estimated Creatinine Clearance: 40.9 mL/min (A) (by C-G formula based on SCr of 1.28 mg/dL (H)). Liver Function Tests: Recent Labs  Lab 01/04/21 2330  AST 20  ALT 20  ALKPHOS 91  BILITOT 1.6*  PROT 7.3  ALBUMIN 3.8    No results for input(s): LIPASE, AMYLASE in the last 168 hours. No results for input(s): AMMONIA in the last 168 hours. Coagulation Profile: Recent Labs  Lab 01/05/21 0535 01/06/21 0352 01/07/21 0552 01/08/21 0517  INR 1.4* 1.4* 2.4* 4.3*    Cardiac Enzymes: No results for input(s): CKTOTAL, CKMB, CKMBINDEX, TROPONINI in the last 168 hours. BNP (last 3 results) No results for input(s): PROBNP in the last 8760 hours. HbA1C: No results for input(s): HGBA1C in the last 72 hours. CBG: No results for input(s): GLUCAP in the last 168 hours. Lipid Profile: No results for input(s): CHOL, HDL, LDLCALC, TRIG, CHOLHDL, LDLDIRECT in the last 72 hours. Thyroid Function Tests: No results for  input(s): TSH, T4TOTAL, FREET4, T3FREE, THYROIDAB in the last 72 hours. Anemia Panel: No results for input(s): VITAMINB12, FOLATE, FERRITIN, TIBC, IRON, RETICCTPCT in the last 72 hours. Sepsis Labs: Recent Labs  Lab 01/07/21 0317 01/07/21 0545 01/07/21 0932  LATICACIDVEN 2.0* 2.9* 1.4     Recent Results (from the past 240 hour(s))  Resp Panel by RT-PCR (Flu A&B, Covid) Nasopharyngeal Swab     Status: None   Collection Time: 01/04/21 11:30 PM   Specimen: Nasopharyngeal Swab; Nasopharyngeal(NP) swabs in vial transport medium  Result Value Ref Range Status   SARS Coronavirus 2 by RT PCR NEGATIVE NEGATIVE Final    Comment: (NOTE) SARS-CoV-2 target nucleic acids are NOT DETECTED.  The SARS-CoV-2 RNA is generally detectable in upper respiratory specimens during the acute phase of infection.  The lowest concentration of SARS-CoV-2 viral copies this assay can detect is 138 copies/mL. A negative result does not preclude SARS-Cov-2 infection and should not be used as the sole basis for treatment or other patient management decisions. A negative result may occur with  improper specimen collection/handling, submission of specimen other than nasopharyngeal swab, presence of viral mutation(s) within the areas targeted by this assay, and inadequate number of viral copies(<138 copies/mL). A negative result must be combined with clinical observations, patient history, and epidemiological information. The expected result is Negative.  Fact Sheet for Patients:  EntrepreneurPulse.com.au  Fact Sheet for Healthcare Providers:  IncredibleEmployment.be  This test is no t yet approved or cleared by the [place] FDA and  has been authorized for detection and/or diagnosis of SARS-CoV-2 by FDA under an Emergency Use Authorization (EUA). This EUA will remain  in effect (meaning this test can be used) for the duration of the COVID-19 declaration under Section  564(b)(1) of the Act, 21 U.S.C.section 360bbb-3(b)(1), unless the authorization is terminated  or revoked sooner.       Influenza A by PCR NEGATIVE NEGATIVE Final   Influenza B by PCR NEGATIVE NEGATIVE Final    Comment: (NOTE) The Xpert Xpress SARS-CoV-2/FLU/RSV plus assay is intended as an aid in the diagnosis of influenza from Nasopharyngeal swab specimens and should not be used as a sole basis for treatment. Nasal washings and aspirates are unacceptable for Xpert Xpress SARS-CoV-2/FLU/RSV testing.  Fact Sheet for Patients: EntrepreneurPulse.com.au  Fact Sheet for Healthcare Providers: IncredibleEmployment.be  This test is not yet approved or cleared by the [place] FDA and has been authorized for detection and/or diagnosis of SARS-CoV-2 by FDA under an Emergency Use Authorization (EUA). This EUA will remain in effect (meaning this test can be used) for the duration of the COVID-19 declaration under Section 564(b)(1) of the Act, 21 U.S.C. section 360bbb-3(b)(1), unless the authorization is terminated or revoked.  Performed at [facility], [place] [address], [place], [place] [address]   MRSA Next Gen by PCR, Nasal     Status: None   Collection Time: 01/07/21  2:31 AM   Specimen: Nasal Mucosa; Nasal Swab  Result Value Ref Range Status   MRSA by PCR Next Gen NOT DETECTED NOT DETECTED Final    Comment: (NOTE) The GeneXpert MRSA Assay (FDA approved for NASAL specimens only), is one component of a comprehensive MRSA colonization surveillance program. It is not intended to diagnose MRSA infection nor to guide or monitor treatment for MRSA infections. Test performance is not FDA approved in patients less than 49 years old. Performed at [facility], [place] [address], [place], [place] [address]   Culture, blood (routine x 2)     Status: None (Preliminary result)   Collection Time: 01/07/21  3:15 AM    Specimen: BLOOD  Result Value Ref Range Status   Specimen Description   Final    BLOOD BLOOD LEFT HAND Performed at [facility] [address], [place], [place] [address]    Special Requests   Final    BOTTLES DRAWN AEROBIC ONLY Blood Culture adequate volume Performed at [facility] [address], [place], [place] [address]    Culture   Final    NO GROWTH < 12 HOURS Performed at [facility] [address] [place], [place] [address]    Report Status PENDING  Incomplete  Culture, blood (routine x 2)     Status: None (  Preliminary result)   Collection Time: 01/07/21  3:15 AM   Specimen: BLOOD  Result Value Ref Range Status   Specimen Description   Final    BLOOD LEFT ANTECUBITAL Performed at [facility] [address] [place], [place] [address]    Special Requests   Final    BOTTLES DRAWN AEROBIC ONLY Blood Culture adequate volume Performed at [facility] [address], [place], [place] [address]    Culture   Final    NO GROWTH < 12 HOURS Performed at [facility] [address], [place], [place] [address]    Report Status PENDING  Incomplete          Radiology Studies: DG Chest 1 View  Result Date: 01/07/2021 CLINICAL DATA:  Shortness of breath. EXAM: CHEST  1 VIEW COMPARISON:  January 04, 2021. FINDINGS: The heart size and mediastinal contours are within normal limits. Both lungs are clear. The visualized skeletal structures are unremarkable. IMPRESSION: No active disease. Electronically Signed   By: Marijo Conception M.D.   On: 01/07/2021 14:46   MR LUMBAR SPINE WO CONTRAST  Result Date: 01/07/2021 CLINICAL DATA:  Initial evaluation for lower back pain. EXAM: MRI LUMBAR SPINE WITHOUT CONTRAST TECHNIQUE: Multiplanar, multisequence MR imaging of the lumbar spine was performed. No intravenous contrast was administered. COMPARISON:  CT from 01/05/2021. FINDINGS: Segmentation:  Standard. Lowest well-formed disc space labeled the L5-S1 level. Alignment: Mild scoliosis. Trace 2 mm anterolisthesis of L3 on L4. Straightening of the normal lumbar lordosis elsewhere. Vertebrae: Compression deformity involving the T12 vertebral body with up to 50% central height loss and trace bony retropulsion is chronic in appearance. There is an acute to subacute compression fracture involving the superior endplate of L4 with mild 10% height loss without bony retropulsion. Marrow edema extends into the right L4 pedicle. Additional acute to subacute compression fracture seen involving the superior endplate of L5 with no more than mild 10% height loss without bony retropulsion. These are benign/mechanical in appearance. Underlying bone marrow signal intensity mildly heterogeneous but within normal limits. No worrisome osseous lesions. No other abnormal marrow edema. Conus medullaris and cauda equina: Conus extends to the T12 level. Conus and cauda equina appear normal. Paraspinal and other soft tissues: Small volume edema/free fluid noted within the presacral space, nonspecific. Paraspinous soft tissues demonstrate no other acute finding. Visualized visceral structures grossly unremarkable. Disc levels: T12-L1: Minimal disc bulge with bony retropulsion related to the chronic T12 compression fracture. Probable superimposed left extraforaminal disc protrusion (series 9, image 9). No significant spinal stenosis. Foramina remain patent. L1-2: Degenerative intervertebral disc space narrowing with diffuse disc bulge. Associated reactive endplate spurring, greater on the right. There is a superimposed right foraminal to extraforaminal disc protrusion contacting the exiting right L1 nerve root (series 9, image 15). Additional tiny left subarticular disc protrusion minimally indents the left ventral thecal sac (series 8, image 15). Mild facet hypertrophy. No significant spinal stenosis. Mild right L1 foraminal narrowing.  Left neural foramina remains patent. L2-3: Disc bulge with reactive endplate spurring. Moderate bilateral facet hypertrophy. Resultant mild canal with bilateral lateral recess stenosis. Mild bilateral L2 foraminal narrowing. L3-4: Trace anterolisthesis. Mild disc bulge, eccentric to the left. Superimposed shallow broad-based left foraminal to extraforaminal disc protrusion contacts the exiting left L3 nerve root (series 9, image 27). Moderate facet hypertrophy. Resultant mild canal with bilateral lateral recess stenosis. Mild left L3 foraminal narrowing. Right neural foramen remains patent. L4-5: Disc desiccation with mild  disc bulge. Superimposed left subarticular disc protrusion with slight inferior migration (series 8, image 34). Moderate facet and ligament flavum hypertrophy. Associated small joint effusions. Resultant severe canal with severe left worse than right lateral recess stenosis. Mild bilateral L4 foraminal narrowing. L5-S1: Disc desiccation. Superimposed shallow left extraforaminal disc protrusion closely approximates the exiting left L5 nerve root (series 9, image 39). Moderate facet hypertrophy. No significant spinal stenosis. Foramina remain patent. IMPRESSION: 1. Acute to subacute compression fractures involving the superior endplates of L4 and L5. No more than mild height loss without bony retropulsion. 2. Additional chronic T12 compression fracture. 3. Multifactorial degenerative changes at L4-5 with resultant severe canal with left worse than right lateral recess stenosis. 4. Foraminal to extraforaminal disc protrusions on the right at L1-2, on the left at L3-4, and on the left at L5-S1, potentially affecting the right L1, left L3, and left L5 nerve roots respectively. 5. Small volume edema/free fluid within the presacral space, nonspecific, and could be reactive in nature. Electronically Signed   By: Jeannine Boga M.D.   On: 01/07/2021 00:18   DG C-Arm 1-60 Min-No Report  Result  Date: 01/07/2021 Fluoroscopy was utilized by the requesting physician.  No radiographic interpretation.        Scheduled Meds:  Chlorhexidine Gluconate Cloth  6 each Topical Q0600   levalbuterol  0.63 mg Nebulization Q6H   levothyroxine  125 mcg Oral QAC breakfast   mouth rinse  15 mL Mouth Rinse BID   sodium chloride flush  10-40 mL Intracatheter Q12H   Warfarin - Pharmacist Dosing Inpatient   Does not apply q1600   Continuous Infusions:  cefTRIAXone (ROCEPHIN)  IV Stopped (01/08/21 0152)     LOS: 2 days    Time spent: 72 min    Georgette Shell, MD 01/08/2021, 12:58 PM

## 2021-01-08 NOTE — Progress Notes (Signed)
Urology Progress Note   1 Day Post-Op from right ureteral stent placement for obstructing infected UPJ stone.  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  Admitted for a fall..   Subjective: Febrile to 101 at 4 PM yesterday.  Broke with Tylenol.  Has been afebrile, normal cardiac, normotensive since.  Made 890 cc of urine output.  Creatinine is downtrending to 1.28.  Her white blood cell count rose to 15.  She is well-appearing on exam.  Objective: Vital signs in last 24 hours: Temp:  [97.5 F (36.4 C)-101.2 F (38.4 C)] 97.5 F (36.4 C) (08/12 0400) Pulse Rate:  [69-110] 75 (08/12 0700) Resp:  [13-23] 15 (08/12 0700) BP: (98-165)/(40-82) 158/65 (08/12 0700) SpO2:  [89 %-100 %] 93 % (08/12 0700) FiO2 (%):  [36 %] 36 % (08/11 1419)  Intake/Output from previous day: 08/11 0701 - 08/12 0700 In: 835 [I.V.:735.Alicia; IV Piggyback:99.1] Out: 890 [Urine:890] Intake/Output this shift: No intake/output data recorded.  Physical Exam:  General: Alert and oriented CV: Regular rate Lungs: No increased work of breathing Abdomen: Soft, appropriately tender GU: Foley in place draining clear yellow urine Ext: NT, No erythema  Lab Results: Recent Labs    01/06/21 0352 01/07/21 0317 01/08/21 0517  HGB 12.Alicia 12.0 10.4*  HCT 41.5 37.Alicia 33.7*   Recent Labs    01/07/21 0317 01/08/21 0517  NA 138 139  K 3.7 4.1  CL 108 107  CO2 20* 22  GLUCOSE 151* 150*  BUN 34* 38*  CREATININE 1.51* 1.28*  CALCIUM 10.5* 10.3    Studies/Results: DG Chest 1 View  Result Date: 01/07/2021 CLINICAL DATA:  Shortness of breath. EXAM: CHEST  1 VIEW COMPARISON:  January 04, 2021. FINDINGS: The heart size and mediastinal contours are within normal limits. Both lungs are clear. The visualized skeletal structures are unremarkable. IMPRESSION: No active disease. Electronically Signed   By: Marijo Conception M.D.   On: 01/07/2021 14:46   MR LUMBAR SPINE WO CONTRAST  Result Date:  01/07/2021 CLINICAL DATA:  Initial evaluation for lower back pain. EXAM: MRI LUMBAR SPINE WITHOUT CONTRAST TECHNIQUE: Multiplanar, multisequence MR imaging of the lumbar spine was performed. No intravenous contrast was administered. COMPARISON:  CT from 01/05/2021. FINDINGS: Segmentation: Standard. Lowest well-formed disc space labeled the L5-S1 level. Alignment: Mild scoliosis. Trace 2 mm anterolisthesis of L3 on L4. Straightening of the normal lumbar lordosis elsewhere. Vertebrae: Compression deformity involving the T12 vertebral body with up to 50% central height loss and trace bony retropulsion is chronic in appearance. There is an acute to subacute compression fracture involving the superior endplate of L4 with mild 10% height loss without bony retropulsion. Marrow edema extends into the right L4 pedicle. Additional acute to subacute compression fracture seen involving the superior endplate of L5 with no more than mild 10% height loss without bony retropulsion. These are benign/mechanical in appearance. Underlying bone marrow signal intensity mildly heterogeneous but within normal limits. No worrisome osseous lesions. No other abnormal marrow edema. Conus medullaris and cauda equina: Conus extends to the T12 level. Conus and cauda equina appear normal. Paraspinal and other soft tissues: Small volume edema/free fluid noted within the presacral space, nonspecific. Paraspinous soft tissues demonstrate no other acute finding. Visualized visceral structures grossly unremarkable. Disc levels: T12-L1: Minimal disc bulge with bony retropulsion related to the chronic T12 compression fracture. Probable superimposed left extraforaminal disc protrusion (series Alicia, image Alicia). No significant spinal stenosis. Foramina remain patent. L1-2: Degenerative intervertebral disc space narrowing  with diffuse disc bulge. Associated reactive endplate spurring, greater on the right. There is a superimposed right foraminal to extraforaminal  disc protrusion contacting the exiting right L1 nerve root (series Alicia, image 15). Additional tiny left subarticular disc protrusion minimally indents the left ventral thecal sac (series 8, image 15). Mild facet hypertrophy. No significant spinal stenosis. Mild right L1 foraminal narrowing. Left neural foramina remains patent. L2-3: Disc bulge with reactive endplate spurring. Moderate bilateral facet hypertrophy. Resultant mild canal with bilateral lateral recess stenosis. Mild bilateral L2 foraminal narrowing. L3-4: Trace anterolisthesis. Mild disc bulge, eccentric to the left. Superimposed shallow broad-based left foraminal to extraforaminal disc protrusion contacts the exiting left L3 nerve root (series Alicia, image 27). Moderate facet hypertrophy. Resultant mild canal with bilateral lateral recess stenosis. Mild left L3 foraminal narrowing. Right neural foramen remains patent. L4-5: Disc desiccation with mild disc bulge. Superimposed left subarticular disc protrusion with slight inferior migration (series 8, image 34). Moderate facet and ligament flavum hypertrophy. Associated small joint effusions. Resultant severe canal with severe left worse than right lateral recess stenosis. Mild bilateral L4 foraminal narrowing. L5-S1: Disc desiccation. Superimposed shallow left extraforaminal disc protrusion closely approximates the exiting left L5 nerve root (series Alicia, image 39). Moderate facet hypertrophy. No significant spinal stenosis. Foramina remain patent. IMPRESSION: 1. Acute to subacute compression fractures involving the superior endplates of L4 and L5. No more than mild height loss without bony retropulsion. 2. Additional chronic T12 compression fracture. 3. Multifactorial degenerative changes at L4-5 with resultant severe canal with left worse than right lateral recess stenosis. 4. Foraminal to extraforaminal disc protrusions on the right at L1-2, on the left at L3-4, and on the left at L5-S1, potentially affecting  the right L1, left L3, and left L5 nerve roots respectively. 5. Small volume edema/free fluid within the presacral space, nonspecific, and could be reactive in nature. Electronically Signed   By: Jeannine Boga M.D.   On: 01/07/2021 00:18   DG C-Arm 1-60 Min-No Report  Result Date: 01/07/2021 Fluoroscopy was utilized by the requesting physician.  No radiographic interpretation.    Assessment/Plan:  80 y.o. Vasquez s/p ureteral stent placement for right UPJ stone with superimposed UTI.  History of metastatic breast cancer and DVT on Coumadin.  Admitted since August Alicia after a fall..  Overall doing better post-op.   -Continue ceftriaxone.  Wean per culture data.  Recommend total treatment course 14 days for complicated UTI -Continue Foley catheter until patient has been afebrile for at least 24 hours after which she can trial void -We will arrange for definitive stone management as an outpatient likely with ureteroscopy and laser lithotripsy rather than percutaneous nephrostolithotomy as it is more invasive given her age, comorbidities, anticoagulation status.  She will need to hold blood thinners prior to surgery.  Dispo: Per primary team   LOS: 2 days

## 2021-01-08 NOTE — Progress Notes (Signed)
Physical Therapy Treatment Patient Details Name: Alicia Vasquez [facility] MRN: [id number] DOB: Aug 02, 1940 Today's Date: 01/08/2021    History of Present Illness 80 yo female admitted with ARF, fall. Hx of met breast cancer, DVT, L TKA 2018, L hip replacement    PT Comments    Pt transferred from recliner to bedside commode, then to bed, using RW. Mod A for sit to stand, min A to take a few pivotal steps. Increased time/effort for all mobility. Continue to recommend SNF.     Follow Up Recommendations  SNF     Equipment Recommendations  None recommended by PT    Recommendations for Other Services OT consult     Precautions / Restrictions Precautions Precautions: Fall Restrictions Weight Bearing Restrictions: No    Mobility  Bed Mobility Overal bed mobility: Needs Assistance Bed Mobility: Sit to Supine       Sit to supine: +2 for physical assistance;Max assist   General bed mobility comments: assist to bring BLEs into bed and to control trunk descent; +2 total to scoot up in bed    Transfers Overall transfer level: Needs assistance Equipment used: Rolling walker (2 wheeled) Transfers: Sit to/from [facility] Sit to Stand: +2 physical assistance;Mod assist Stand pivot transfers: Min assist;+2 safety/equipment       General transfer comment: assist to rise from recliner, VCs hand placement, increased time/effort  Ambulation/Gait Ambulation/Gait assistance: Min assist Gait Distance (Feet): 4 Feet Assistive device: Rolling walker (2 wheeled) Gait Pattern/deviations: Step-to pattern;Decreased step length - right;Decreased step length - left;Trunk flexed Gait velocity: decr   General Gait Details: several pivotal steps for recliner to 3 in 1 then to bed   Stairs             Wheelchair Mobility    Modified Rankin (Stroke Patients Only)       Balance                                            Cognition  Arousal/Alertness: Awake/alert Behavior During Therapy: WFL for tasks assessed/performed Overall Cognitive Status: Within Functional Limits for tasks assessed                                        Exercises General Exercises - Lower Extremity Ankle Circles/Pumps: AROM;Both;10 reps;Supine    General Comments        Pertinent Vitals/Pain Faces Pain Scale: Hurts even more Pain Location: L LE/ hip, back with movement Pain Descriptors / Indicators: Grimacing Pain Intervention(s): Limited activity within patient's tolerance;Monitored during session;Repositioned    Home Living                      Prior Function            PT Goals (current goals can now be found in the care plan section) Acute Rehab PT Goals Patient Stated Goal: none stated PT Goal Formulation: With patient Time For Goal Achievement: 01/19/21 Potential to Achieve Goals: Good Progress towards PT goals: Progressing toward goals    Frequency    Min 2X/week      PT Plan Current plan remains appropriate    Co-evaluation              AM-PAC PT "6 Clicks" Mobility  Outcome Measure  Help needed turning from your back to your side while in a flat bed without using bedrails?: Total Help needed moving from lying on your back to sitting on the side of a flat bed without using bedrails?: Total Help needed moving to and from a bed to a chair (including a wheelchair)?: A Lot Help needed standing up from a chair using your arms (e.g., wheelchair or bedside chair)?: A Lot Help needed to walk in hospital room?: A Lot Help needed climbing 3-5 steps with a railing? : Total 6 Click Score: 9    End of Session Equipment Utilized During Treatment: Gait belt Activity Tolerance: Patient limited by pain Patient left: in bed;with call bell/phone within reach;with bed alarm set;with nursing/sitter in room Nurse Communication: Mobility status PT Visit Diagnosis: Muscle weakness (generalized)  (M62.81);Pain;Difficulty in walking, not elsewhere classified (R26.2);History of falling (Z91.81)     Time: 9324-1991 PT Time Calculation (min) (ACUTE ONLY): 29 min  Charges:  $Therapeutic Activity: 23-37 mins                    Alicia Vasquez PT 01/08/2021  Acute Rehabilitation Services Pager [phone] Office [phone]

## 2021-01-09 ENCOUNTER — Encounter (HOSPITAL_COMMUNITY): Payer: Self-pay | Admitting: Urology

## 2021-01-09 LAB — CBC WITH DIFFERENTIAL/PLATELET
Abs Immature Granulocytes: 0.25 10*3/uL — ABNORMAL HIGH (ref 0.00–0.07)
Basophils Absolute: 0 10*3/uL (ref 0.0–0.1)
Basophils Relative: 0 %
Eosinophils Absolute: 0.1 10*3/uL (ref 0.0–0.5)
Eosinophils Relative: 1 %
HCT: 33.7 % — ABNORMAL LOW (ref 36.0–46.0)
Hemoglobin: 10.6 g/dL — ABNORMAL LOW (ref 12.0–15.0)
Immature Granulocytes: 2 %
Lymphocytes Relative: 7 %
Lymphs Abs: 1.1 10*3/uL (ref 0.7–4.0)
MCH: 28.3 pg (ref 26.0–34.0)
MCHC: 31.5 g/dL (ref 30.0–36.0)
MCV: 90.1 fL (ref 80.0–100.0)
Monocytes Absolute: 1.4 10*3/uL — ABNORMAL HIGH (ref 0.1–1.0)
Monocytes Relative: 9 %
Neutro Abs: 12.7 10*3/uL — ABNORMAL HIGH (ref 1.7–7.7)
Neutrophils Relative %: 81 %
Platelets: 112 10*3/uL — ABNORMAL LOW (ref 150–400)
RBC: 3.74 MIL/uL — ABNORMAL LOW (ref 3.87–5.11)
RDW: 16.5 % — ABNORMAL HIGH (ref 11.5–15.5)
WBC: 15.5 10*3/uL — ABNORMAL HIGH (ref 4.0–10.5)
nRBC: 0 % (ref 0.0–0.2)

## 2021-01-09 LAB — BASIC METABOLIC PANEL
Anion gap: 6 (ref 5–15)
BUN: 45 mg/dL — ABNORMAL HIGH (ref 8–23)
CO2: 24 mmol/L (ref 22–32)
Calcium: 10.3 mg/dL (ref 8.9–10.3)
Chloride: 105 mmol/L (ref 98–111)
Creatinine, Ser: 1.09 mg/dL — ABNORMAL HIGH (ref 0.44–1.00)
GFR, Estimated: 51 mL/min — ABNORMAL LOW (ref >60)
Glucose, Bld: 132 mg/dL — ABNORMAL HIGH (ref 70–99)
Potassium: 3.5 mmol/L (ref 3.5–5.1)
Sodium: 135 mmol/L (ref 135–145)

## 2021-01-09 LAB — GLUCOSE, CAPILLARY: Glucose-Capillary: 104 mg/dL — ABNORMAL HIGH (ref 70–99)

## 2021-01-09 LAB — MAGNESIUM: Magnesium: 2.1 mg/dL (ref 1.7–2.4)

## 2021-01-09 LAB — PROTIME-INR
INR: 3.5 — ABNORMAL HIGH (ref 0.8–1.2)
Prothrombin Time: 35.2 seconds — ABNORMAL HIGH (ref 11.4–15.2)

## 2021-01-09 NOTE — Progress Notes (Signed)
[place] for Warfarin Indication: DVT  Patient Measurements: Height: '5\' 4"'$  (162.6 cm) Weight: 105.7 kg (233 lb 0.4 oz) IBW/kg (Calculated) : 54.7  Vital Signs: Temp: 98.1 F (36.7 C) (08/13 0834) Temp Source: Oral (08/13 0834) BP: 160/83 (08/13 0735) Pulse Rate: 75 (08/13 0735)  Labs: Recent Labs    01/07/21 0317 01/07/21 0552 01/08/21 0517 01/09/21 0249  HGB 12.0  --  10.4* 10.6*  HCT 37.9  --  33.7* 33.7*  PLT 127*  --  96* 112*  LABPROT  --  26.1* 41.6* 35.2*  INR  --  2.4* 4.3* 3.5*  CREATININE 1.51*  --  1.28* 1.09*     Estimated Creatinine Clearance: 48 mL/min (A) (by C-G formula based on SCr of 1.09 mg/dL (H)).   Medications:  PTA Warfarin '5mg'$  daily except 7.'5mg'$  MWF- dosed by Velora Heckler PCP per med rec but Rx sent by Dr Jana Hakim of Onc  Assessment: 80 yr female brought to ED s/p fall. PMH significant for breast cancer, hx DVT (on warfarin). Reported last dose taken 01/04/21 per med rec/son but per ED RN note pt stated she did not take it 8/7 or 8/8. Admit INR was subtherapeutic at 1.4. CT head on admission: No acute abnormality.  Pt underwent cystoscopy with retrograde pyelogram/ureteral stent placement on 8/11.   Today, 01/09/2021:   INR = 3.5 remains SUPRAtherapeutic, but is trending down CBC: Hgb (10.6) low but stable; Plt (112) low but trending up No bleeding reported. FOBT negative, urine clear No major DDI. Ceftriaxone does have some potential to enhance effects of INR.  Goal of Therapy:  INR 2-3  Plan:  HOLD warfarin again today Recheck INR, CBC with AM labs tomorrow Monitor for signs of bleeding  Lenis Noon, PharmD 01/09/21 9:34 AM

## 2021-01-09 NOTE — Progress Notes (Signed)
PROGRESS NOTE    Alicia Vasquez  [id number] DOB: Jul 20, 1940 DOA: 01/04/2021 PCP: [name], MD   Brief Narrative: 80 year old female lives at home with her friend.  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  Patient has been having nausea vomiting decreased p.o. intake.  She tripped and fell and hit her head did not lose consciousness.  CT of the head and spine unremarkable.  She was found to be in AKI with leukocytosis and UA consistent with UTI.  Patient was started on Rocephin.  Her prior urine culture grew Proteus Mirabella's sensitive to Rocephin.  She is being admitted for AKI and UTI.  She was seen by physical therapy recommending patient needs SNF.  Assessment & Plan:   Principal Problem:   ARF (acute renal failure) (HCC) Active Problems:   Hypothyroid   HTN (hypertension)   Primary hypercoagulable state ([place])   DVT, lower extremity ([place])   AKI (acute kidney injury) ([place])    #1 AKI secondary to obstructive uropathy and dehydration decreased p.o. intake.Creatinine 1.28 from 1.51.  Improving   #2 urinary tract infection UA consistent with UTI CT shows obstructive renal stone on Rocephin  Still with leukocytosis Status post stent in the right kidney 01/07/2021 Foley to be DC'd today 01/09/2021 I do not see a urine culture from this admission.  However her blood cultures have been negative.  Previous urine culture from 12/16/2020 grew Proteus resistant only to nitrofurantoin.   #3 DVT on Coumadin dosing by pharmacy.  INR 3.5  From 4.3.    #4 hypothyroidism on Synthroid  #5 metastatic breast cancer followed by oncology  #6 history of hyperparathyroidism followed by endocrinology  #7 severe deconditioning and generalized weakness PT consulted recommending SNF.  #8 lumbar compression fracture -MRI lumbar spine acute to subacute compression fracture involving  the superior endplate of L4 and L5.  Chronic T12 compression fracture.  Will refer her to interventional radiology upon discharge.  #9 anemia hemoglobin 10.4 from 12.0.  Possible hemodilution as she was getting IV fluids for dehydration.  No evidence of active bleeding or gross bleeding noted.  FOBT negative.   Estimated body mass index is 40 kg/m as calculated from the following:   Height as of this encounter: '5\' 4"'$  (1.626 m).   Weight as of this encounter: 105.7 kg.  DVT prophylaxis: Coumadin  code Status: Full code Family Communication: None at bedside Disposition Plan:  Status is: Inpatient  Remains inpatient appropriate because:IV treatments appropriate due to intensity of illness or inability to take PO  Dispo: The patient is from: Home              Anticipated d/c is to: SNF              Patient currently is not medically stable to d/c.   Difficult to place patient No Consultants:  None  Procedures: None Antimicrobials: Rocephin  Subjective: She is resting in bed much more awake alert Foley in place with clear urine  Objective: Vitals:   01/09/21 0000 01/09/21 0300 01/09/21 0735 01/09/21 0834  BP: (!) 153/57  (!) 160/83   Pulse: 73  75   Resp: 17  16   Temp: 97.8 F (36.6 C) 97.6 F (36.4 C)  98.1 F (36.7 C)  TempSrc: Oral Axillary  Oral  SpO2: 100%  97%   Weight:      Height:  Intake/Output Summary (Last 24 hours) at 01/09/2021 1030 Last data filed at 01/09/2021 0330 Gross per 24 hour  Intake 580 ml  Output 1130 ml  Net -550 ml    Filed Weights   01/04/21 2217 01/05/21 0257 01/07/21 0400  Weight: 102.1 kg 99.5 kg 105.7 kg    Examination:  General exam: Appears calm and comfortable  Respiratory system: Clear to auscultation. Respiratory effort normal. Cardiovascular system: S1 & S2 heard, RRR. No JVD, murmurs, rubs, gallops or clicks. No pedal edema. Gastrointestinal system: Abdomen is distended, soft and nontender. No organomegaly or  masses felt. Normal bowel sounds heard. Central nervous system: Awake alert and oriented extremities: Trace edema bilaterally Skin: No rashes, lesions or ulcers Psychiatry: Judgement and insight appear normal. Mood & affect appropriate.     Data Reviewed: I have personally reviewed following labs and imaging studies  CBC: Recent Labs  Lab 01/04/21 2330 01/05/21 0535 01/06/21 0352 01/07/21 0317 01/08/21 0517 01/09/21 0249  WBC 16.3* 15.3* 12.1* 11.4* 15.7* 15.5*  NEUTROABS 13.2*  --  10.1* 10.1* 13.7* 12.7*  HGB 13.6 12.7 12.9 12.0 10.4* 10.6*  HCT 42.3 38.6 41.5 37.9 33.7* 33.7*  MCV 88.9 89.4 91.6 90.0 90.6 90.1  PLT 211 187 164 127* 96* 112*    Basic Metabolic Panel: Recent Labs  Lab 01/05/21 0535 01/06/21 0352 01/07/21 0317 01/08/21 0517 01/09/21 0249  NA 136 138 138 139 135  K 3.8 3.9 3.7 4.1 3.5  CL 103 110 108 107 105  CO2 23 18* 20* 22 24  GLUCOSE 136* 121* 151* 150* 132*  BUN 34* 32* 34* 38* 45*  CREATININE 1.62* 1.35* 1.51* 1.28* 1.09*  CALCIUM 10.5* 10.3 10.5* 10.3 10.3  MG  --  2.1 2.0 2.2 2.1    GFR: Estimated Creatinine Clearance: 48 mL/min (A) (by C-G formula based on SCr of 1.09 mg/dL (H)). Liver Function Tests: Recent Labs  Lab 01/04/21 2330  AST 20  ALT 20  ALKPHOS 91  BILITOT 1.6*  PROT 7.3  ALBUMIN 3.8    No results for input(s): LIPASE, AMYLASE in the last 168 hours. No results for input(s): AMMONIA in the last 168 hours. Coagulation Profile: Recent Labs  Lab 01/05/21 0535 01/06/21 0352 01/07/21 0552 01/08/21 0517 01/09/21 0249  INR 1.4* 1.4* 2.4* 4.3* 3.5*    Cardiac Enzymes: No results for input(s): CKTOTAL, CKMB, CKMBINDEX, TROPONINI in the last 168 hours. BNP (last 3 results) No results for input(s): PROBNP in the last 8760 hours. HbA1C: No results for input(s): HGBA1C in the last 72 hours. CBG: Recent Labs  Lab 01/09/21 0800  GLUCAP 104*   Lipid Profile: No results for input(s): CHOL, HDL, LDLCALC, TRIG,  CHOLHDL, LDLDIRECT in the last 72 hours. Thyroid Function Tests: No results for input(s): TSH, T4TOTAL, FREET4, T3FREE, THYROIDAB in the last 72 hours. Anemia Panel: No results for input(s): VITAMINB12, FOLATE, FERRITIN, TIBC, IRON, RETICCTPCT in the last 72 hours. Sepsis Labs: Recent Labs  Lab 01/07/21 0317 01/07/21 0545 01/07/21 0932  LATICACIDVEN 2.0* 2.9* 1.4     Recent Results (from the past 240 hour(s))  Resp Panel by RT-PCR (Flu A&B, Covid) Nasopharyngeal Swab     Status: None   Collection Time: 01/04/21 11:30 PM   Specimen: Nasopharyngeal Swab; Nasopharyngeal(NP) swabs in vial transport medium  Result Value Ref Range Status   SARS Coronavirus 2 by RT PCR NEGATIVE NEGATIVE Final    Comment: (NOTE) SARS-CoV-2 target nucleic acids are NOT DETECTED.  The SARS-CoV-2 RNA is generally detectable  in upper respiratory specimens during the acute phase of infection. The lowest concentration of SARS-CoV-2 viral copies this assay can detect is 138 copies/mL. A negative result does not preclude SARS-Cov-2 infection and should not be used as the sole basis for treatment or other patient management decisions. A negative result may occur with  improper specimen collection/handling, submission of specimen other than nasopharyngeal swab, presence of viral mutation(s) within the areas targeted by this assay, and inadequate number of viral copies(<138 copies/mL). A negative result must be combined with clinical observations, patient history, and epidemiological information. The expected result is Negative.  Fact Sheet for Patients:  EntrepreneurPulse.com.au  Fact Sheet for Healthcare Providers:  IncredibleEmployment.be  This test is no t yet approved or cleared by the [place] FDA and  has been authorized for detection and/or diagnosis of SARS-CoV-2 by FDA under an Emergency Use Authorization (EUA). This EUA will remain  in effect (meaning this  test can be used) for the duration of the COVID-19 declaration under Section 564(b)(1) of the Act, 21 U.S.C.section 360bbb-3(b)(1), unless the authorization is terminated  or revoked sooner.       Influenza A by PCR NEGATIVE NEGATIVE Final   Influenza B by PCR NEGATIVE NEGATIVE Final    Comment: (NOTE) The Xpert Xpress SARS-CoV-2/FLU/RSV plus assay is intended as an aid in the diagnosis of influenza from Nasopharyngeal swab specimens and should not be used as a sole basis for treatment. Nasal washings and aspirates are unacceptable for Xpert Xpress SARS-CoV-2/FLU/RSV testing.  Fact Sheet for Patients: EntrepreneurPulse.com.au  Fact Sheet for Healthcare Providers: IncredibleEmployment.be  This test is not yet approved or cleared by the [place] FDA and has been authorized for detection and/or diagnosis of SARS-CoV-2 by FDA under an Emergency Use Authorization (EUA). This EUA will remain in effect (meaning this test can be used) for the duration of the COVID-19 declaration under Section 564(b)(1) of the Act, 21 U.S.C. section 360bbb-3(b)(1), unless the authorization is terminated or revoked.  Performed at [facility], [place] [address], [place], [place] [address]   MRSA Next Gen by PCR, Nasal     Status: None   Collection Time: 01/07/21  2:31 AM   Specimen: Nasal Mucosa; Nasal Swab  Result Value Ref Range Status   MRSA by PCR Next Gen NOT DETECTED NOT DETECTED Final    Comment: (NOTE) The GeneXpert MRSA Assay (FDA approved for NASAL specimens only), is one component of a comprehensive MRSA colonization surveillance program. It is not intended to diagnose MRSA infection nor to guide or monitor treatment for MRSA infections. Test performance is not FDA approved in patients less than 3 years old. Performed at [facility], [place] [address], [place], [place] [address]   Culture, blood (routine x 2)      Status: None (Preliminary result)   Collection Time: 01/07/21  3:15 AM   Specimen: BLOOD  Result Value Ref Range Status   Specimen Description   Final    BLOOD BLOOD LEFT HAND Performed at [place] [address], [place], [place] [address]    Special Requests   Final    BOTTLES DRAWN AEROBIC ONLY Blood Culture adequate volume Performed at [place] [address], [place], [place] [address]    Culture   Final    NO GROWTH 1 DAY Performed at [facility], [place] [address], [place], [place] [address]    Report Status PENDING  Incomplete  Culture, blood (  routine x 2)     Status: None (Preliminary result)   Collection Time: 01/07/21  3:15 AM   Specimen: BLOOD  Result Value Ref Range Status   Specimen Description   Final    BLOOD LEFT ANTECUBITAL Performed at [facility] [address], [place], [place] [address]    Special Requests   Final    BOTTLES DRAWN AEROBIC ONLY Blood Culture adequate volume Performed at [facility] [address], [place], [place] [address]    Culture   Final    NO GROWTH 1 DAY Performed at [facility], [place] [address], [place], [place] [address]    Report Status PENDING  Incomplete          Radiology Studies: DG Chest 1 View  Result Date: 01/07/2021 CLINICAL DATA:  Shortness of breath. EXAM: CHEST  1 VIEW COMPARISON:  January 04, 2021. FINDINGS: The heart size and mediastinal contours are within normal limits. Both lungs are clear. The visualized skeletal structures are unremarkable. IMPRESSION: No active disease. Electronically Signed   By: Marijo Conception M.D.   On: 01/07/2021 14:46   DG C-Arm 1-60 Min-No Report  Result Date: 01/07/2021 Fluoroscopy was utilized by the requesting physician.  No radiographic interpretation.        Scheduled Meds:  Chlorhexidine Gluconate Cloth  6 each Topical Q0600   levothyroxine  125 mcg Oral QAC breakfast    mouth rinse  15 mL Mouth Rinse BID   sodium chloride flush  10-40 mL Intracatheter Q12H   Warfarin - Pharmacist Dosing Inpatient   Does not apply q1600   Continuous Infusions:  cefTRIAXone (ROCEPHIN)  IV Stopped (01/09/21 0123)     LOS: 3 days    Time spent: 14 min    Georgette Shell, MD 01/09/2021, 10:30 AM

## 2021-01-09 NOTE — Progress Notes (Addendum)
Urology Progress Note   2 Days Post-Op from right ureteral stent placement for obstructing infected UPJ stone.  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  Admitted for a fall..   Subjective: Afebrile since 4 PM on 01/07/2021.  Creatinine continues to downtrend to 1.09.  White blood cell count remains high at 15.5.  Blood cultures no growth to date.  Past urine culture grew Proteus.  Patient is hemodynamically stable.  Urine is clear yellow.  Objective: Vital signs in last 24 hours: Temp:  [97.5 F (36.4 C)-98.1 F (36.7 C)] 98.1 F (36.7 C) (08/13 0834) Pulse Rate:  [73-85] 75 (08/13 0735) Resp:  [14-24] 16 (08/13 0735) BP: (119-160)/(54-83) 160/83 (08/13 0735) SpO2:  [90 %-100 %] 97 % (08/13 0735)  Intake/Output from previous day: 08/12 0701 - 08/13 0700 In: 820 [P.O.:720; IV Piggyback:100] Out: 1250 [Urine:1250] Intake/Output this shift: No intake/output data recorded.  Physical Exam:  General: Alert and oriented CV: Regular rate Lungs: No increased work of breathing Abdomen: Soft, appropriately tender GU: Foley in place draining clear yellow urine Ext: NT, No erythema  Lab Results: Recent Labs    01/07/21 0317 01/08/21 0517 01/09/21 0249  HGB 12.0 10.4* 10.6*  HCT 37.9 33.7* 33.7*    Recent Labs    01/08/21 0517 01/09/21 0249  NA 139 135  K 4.1 3.5  CL 107 105  CO2 22 24  GLUCOSE 150* 132*  BUN 38* 45*  CREATININE 1.28* 1.09*  CALCIUM 10.3 10.3     Studies/Results: DG Chest 1 View  Result Date: 01/07/2021 CLINICAL DATA:  Shortness of breath. EXAM: CHEST  1 VIEW COMPARISON:  January 04, 2021. FINDINGS: The heart size and mediastinal contours are within normal limits. Both lungs are clear. The visualized skeletal structures are unremarkable. IMPRESSION: No active disease. Electronically Signed   By: Marijo Conception M.D.   On: 01/07/2021 14:46   DG C-Arm 1-60 Min-No Report  Result Date: 01/07/2021 Fluoroscopy was utilized by  the requesting physician.  No radiographic interpretation.    Assessment/Plan:  80 y.o. female s/p ureteral stent placement for right UPJ stone with superimposed UTI.  History of metastatic breast cancer and DVT on Coumadin.  Admitted since August 9 after a fall..  Overall doing better post-op.   -Cultures negative.  Previous culture was sensitive to ciprofloxacin in the past.  Recommend transitioning to oral ciprofloxacin 500 mg twice daily for a total of 2 weeks total of antibiotic therapy.  -Recommend trial of void today.  Order for Foley catheter discontinued placed.  -We will arrange for definitive stone management as an outpatient with ureteroscopy and laser lithotripsy   Dispo: Per primary team   LOS: 3 days

## 2021-01-10 LAB — BASIC METABOLIC PANEL
Anion gap: 7 (ref 5–15)
BUN: 33 mg/dL — ABNORMAL HIGH (ref 8–23)
CO2: 25 mmol/L (ref 22–32)
Calcium: 10.1 mg/dL (ref 8.9–10.3)
Chloride: 105 mmol/L (ref 98–111)
Creatinine, Ser: 0.85 mg/dL (ref 0.44–1.00)
GFR, Estimated: >60 mL/min (ref >60)
Glucose, Bld: 109 mg/dL — ABNORMAL HIGH (ref 70–99)
Potassium: 3.3 mmol/L — ABNORMAL LOW (ref 3.5–5.1)
Sodium: 137 mmol/L (ref 135–145)

## 2021-01-10 LAB — CBC WITH DIFFERENTIAL/PLATELET
Abs Immature Granulocytes: 0.43 10*3/uL — ABNORMAL HIGH (ref 0.00–0.07)
Basophils Absolute: 0 10*3/uL (ref 0.0–0.1)
Basophils Relative: 0 %
Eosinophils Absolute: 0.3 10*3/uL (ref 0.0–0.5)
Eosinophils Relative: 3 %
HCT: 33.5 % — ABNORMAL LOW (ref 36.0–46.0)
Hemoglobin: 10.6 g/dL — ABNORMAL LOW (ref 12.0–15.0)
Immature Granulocytes: 4 %
Lymphocytes Relative: 10 %
Lymphs Abs: 1 10*3/uL (ref 0.7–4.0)
MCH: 28 pg (ref 26.0–34.0)
MCHC: 31.6 g/dL (ref 30.0–36.0)
MCV: 88.4 fL (ref 80.0–100.0)
Monocytes Absolute: 1 10*3/uL (ref 0.1–1.0)
Monocytes Relative: 10 %
Neutro Abs: 8 10*3/uL — ABNORMAL HIGH (ref 1.7–7.7)
Neutrophils Relative %: 73 %
Platelets: 131 10*3/uL — ABNORMAL LOW (ref 150–400)
RBC: 3.79 MIL/uL — ABNORMAL LOW (ref 3.87–5.11)
RDW: 16.6 % — ABNORMAL HIGH (ref 11.5–15.5)
WBC: 10.8 10*3/uL — ABNORMAL HIGH (ref 4.0–10.5)
nRBC: 0 % (ref 0.0–0.2)

## 2021-01-10 LAB — PROTIME-INR
INR: 3 — ABNORMAL HIGH (ref 0.8–1.2)
Prothrombin Time: 31.3 seconds — ABNORMAL HIGH (ref 11.4–15.2)

## 2021-01-10 LAB — MAGNESIUM: Magnesium: 1.9 mg/dL (ref 1.7–2.4)

## 2021-01-10 MED ORDER — POTASSIUM CHLORIDE CRYS ER 10 MEQ PO TBCR
40.0000 meq | EXTENDED_RELEASE_TABLET | Freq: Once | ORAL | Status: AC
  Administered 2021-01-10: 40 meq via ORAL
  Filled 2021-01-10: qty 4

## 2021-01-10 MED ORDER — MAGNESIUM SULFATE 2 GM/50ML IV SOLN: 2.0000 g | Freq: Once | INTRAVENOUS | Status: DC

## 2021-01-10 MED ORDER — WARFARIN SODIUM 5 MG PO TABS
5.0000 mg | ORAL_TABLET | Freq: Once | ORAL | Status: AC
  Administered 2021-01-10: 5 mg via ORAL
  Filled 2021-01-10: qty 1

## 2021-01-10 NOTE — Progress Notes (Signed)
PROGRESS NOTE    Alicia Vasquez  [id number] DOB: [birth year] DOA: 01/04/2021 PCP: [name], MD   Brief Narrative: 80 year old female lives at home with her friend.  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  Patient has been having nausea vomiting decreased p.o. intake.  She tripped and fell and hit her head did not lose consciousness.  CT of the head and spine unremarkable.  She was found to be in AKI with leukocytosis and UA consistent with UTI.  Patient was started on Rocephin.  Her prior urine culture grew Proteus Mirabella's sensitive to Rocephin.  She is being admitted for AKI and UTI.  She was seen by physical therapy recommending patient needs SNF.  Assessment & Plan:   Principal Problem:   ARF (acute renal failure) (HCC) Active Problems:   Hypothyroid   HTN (hypertension)   Primary hypercoagulable state ([place])   DVT, lower extremity ([place])   AKI (acute kidney injury) ([place])    #1 AKI secondary to obstructive uropathy and dehydration decreased p.o. intake.Creatinine 1.28 from 1.51.  Improving   #2 urinary tract infection UA consistent with UTI CT shows obstructive renal stone on Rocephin  Leukocytosis resolved Status post stent in the right kidney 01/07/2021 Foley  Dc'd 01/09/2021 I do not see a urine culture from this admission.  However her blood cultures have been negative.  Previous urine culture from 12/16/2020 grew Proteus resistant only to nitrofurantoin.   #3 DVT on Coumadin dosing by pharmacy.  INR 3.0 from 3.5  From 4.3.    #4 hypothyroidism on Synthroid  #5 metastatic breast cancer followed by oncology  #6 history of hyperparathyroidism followed by endocrinology  #7 severe deconditioning and generalized weakness PT consulted recommending SNF.  #8 lumbar compression fracture -MRI lumbar spine acute to subacute compression fracture involving the  superior endplate of L4 and L5.  Chronic T12 compression fracture.  Will refer her to interventional radiology upon discharge.  #9 anemia hemoglobin 10.4 from 12.0.  Possible hemodilution as she was getting IV fluids for dehydration.  No evidence of active bleeding or gross bleeding noted.  FOBT negative.  #10 hypokalemia replete and recheck labs check magnesium.   Estimated body mass index is 40 kg/m as calculated from the following:   Height as of this encounter: '5\' 4"'$  (1.626 m).   Weight as of this encounter: 105.7 kg.  DVT prophylaxis: Coumadin  code Status: Full code Family Communication: None at bedside Disposition Plan:  Status is: Inpatient  Remains inpatient appropriate because:IV treatments appropriate due to intensity of illness or inability to take PO  Dispo: The patient is from: Home              Anticipated d/c is to: SNF              Patient currently is medically stable for discharge to SNF   Difficult to place patient No Consultants:  None  Procedures: None Antimicrobials: Rocephin  Subjective: Patient is resting in bed awake alert very deconditioned and weak Now has pure wick on urine still clear WBC is trended down back to normal no more fevers Objective: Vitals:   01/09/21 1330 01/09/21 1508 01/09/21 1933 01/10/21 0514  BP:  (!) 140/51 135/86 (!) 146/68  Pulse:  85 86 86  Resp:  '16 18 18  '$ Temp: 97.7 F (36.5 C) 97.8 F (36.6 C) 98 F (36.7 C) 97.9 F (36.6  C)  TempSrc: Oral Oral Oral Oral  SpO2:  96% 97% 96%  Weight:      Height:        Intake/Output Summary (Last 24 hours) at 01/10/2021 1236 Last data filed at 01/10/2021 0917 Gross per 24 hour  Intake 718 ml  Output 675 ml  Net 43 ml    Filed Weights   01/04/21 2217 01/05/21 0257 01/07/21 0400  Weight: 102.1 kg 99.5 kg 105.7 kg    Examination:  General exam: Appears calm and comfortable  Respiratory system: Clear to auscultation. Respiratory effort normal. Cardiovascular system: S1  & S2 heard, RRR. No JVD, murmurs, rubs, gallops or clicks. No pedal edema. Gastrointestinal system: Abdomen is distended, soft and nontender. No organomegaly or masses felt. Normal bowel sounds heard. Central nervous system: Awake alert and oriented extremities: Trace edema bilaterally Skin: No rashes, lesions or ulcers Psychiatry: Judgement and insight appear normal. Mood & affect appropriate.     Data Reviewed: I have personally reviewed following labs and imaging studies  CBC: Recent Labs  Lab 01/06/21 0352 01/07/21 0317 01/08/21 0517 01/09/21 0249 01/10/21 0325  WBC 12.1* 11.4* 15.7* 15.5* 10.8*  NEUTROABS 10.1* 10.1* 13.7* 12.7* 8.0*  HGB 12.9 12.0 10.4* 10.6* 10.6*  HCT 41.5 37.9 33.7* 33.7* 33.5*  MCV 91.6 90.0 90.6 90.1 88.4  PLT 164 127* 96* 112* 131*    Basic Metabolic Panel: Recent Labs  Lab 01/06/21 0352 01/07/21 0317 01/08/21 0517 01/09/21 0249 01/10/21 0325  NA 138 138 139 135 137  K 3.9 3.7 4.1 3.5 3.3*  CL 110 108 107 105 105  CO2 18* 20* '22 24 25  '$ GLUCOSE 121* 151* 150* 132* 109*  BUN 32* 34* 38* 45* 33*  CREATININE 1.35* 1.51* 1.28* 1.09* 0.85  CALCIUM 10.3 10.5* 10.3 10.3 10.1  MG 2.1 2.0 2.2 2.1 1.9    GFR: Estimated Creatinine Clearance: 61.5 mL/min (by C-G formula based on SCr of 0.85 mg/dL). Liver Function Tests: Recent Labs  Lab 01/04/21 2330  AST 20  ALT 20  ALKPHOS 91  BILITOT 1.6*  PROT 7.3  ALBUMIN 3.8    No results for input(s): LIPASE, AMYLASE in the last 168 hours. No results for input(s): AMMONIA in the last 168 hours. Coagulation Profile: Recent Labs  Lab 01/06/21 0352 01/07/21 0552 01/08/21 0517 01/09/21 0249 01/10/21 0325  INR 1.4* 2.4* 4.3* 3.5* 3.0*    Cardiac Enzymes: No results for input(s): CKTOTAL, CKMB, CKMBINDEX, TROPONINI in the last 168 hours. BNP (last 3 results) No results for input(s): PROBNP in the last 8760 hours. HbA1C: No results for input(s): HGBA1C in the last 72 hours. CBG: Recent  Labs  Lab 01/09/21 0800  GLUCAP 104*    Lipid Profile: No results for input(s): CHOL, HDL, LDLCALC, TRIG, CHOLHDL, LDLDIRECT in the last 72 hours. Thyroid Function Tests: No results for input(s): TSH, T4TOTAL, FREET4, T3FREE, THYROIDAB in the last 72 hours. Anemia Panel: No results for input(s): VITAMINB12, FOLATE, FERRITIN, TIBC, IRON, RETICCTPCT in the last 72 hours. Sepsis Labs: Recent Labs  Lab 01/07/21 0317 01/07/21 0545 01/07/21 0932  LATICACIDVEN 2.0* 2.9* 1.4     Recent Results (from the past 240 hour(s))  Resp Panel by RT-PCR (Flu A&B, Covid) Nasopharyngeal Swab     Status: None   Collection Time: 01/04/21 11:30 PM   Specimen: Nasopharyngeal Swab; Nasopharyngeal(NP) swabs in vial transport medium  Result Value Ref Range Status   SARS Coronavirus 2 by RT PCR NEGATIVE NEGATIVE Final    Comment: (  NOTE) SARS-CoV-2 target nucleic acids are NOT DETECTED.  The SARS-CoV-2 RNA is generally detectable in upper respiratory specimens during the acute phase of infection. The lowest concentration of SARS-CoV-2 viral copies this assay can detect is 138 copies/mL. A negative result does not preclude SARS-Cov-2 infection and should not be used as the sole basis for treatment or other patient management decisions. A negative result may occur with  improper specimen collection/handling, submission of specimen other than nasopharyngeal swab, presence of viral mutation(s) within the areas targeted by this assay, and inadequate number of viral copies(<138 copies/mL). A negative result must be combined with clinical observations, patient history, and epidemiological information. The expected result is Negative.  Fact Sheet for Patients:  EntrepreneurPulse.com.au  Fact Sheet for Healthcare Providers:  IncredibleEmployment.be  This test is no t yet approved or cleared by the [place] FDA and  has been authorized for detection and/or diagnosis  of SARS-CoV-2 by FDA under an Emergency Use Authorization (EUA). This EUA will remain  in effect (meaning this test can be used) for the duration of the COVID-19 declaration under Section 564(b)(1) of the Act, 21 U.S.C.section 360bbb-3(b)(1), unless the authorization is terminated  or revoked sooner.       Influenza A by PCR NEGATIVE NEGATIVE Final   Influenza B by PCR NEGATIVE NEGATIVE Final    Comment: (NOTE) The Xpert Xpress SARS-CoV-2/FLU/RSV plus assay is intended as an aid in the diagnosis of influenza from Nasopharyngeal swab specimens and should not be used as a sole basis for treatment. Nasal washings and aspirates are unacceptable for Xpert Xpress SARS-CoV-2/FLU/RSV testing.  Fact Sheet for Patients: EntrepreneurPulse.com.au  Fact Sheet for Healthcare Providers: IncredibleEmployment.be  This test is not yet approved or cleared by the [place] FDA and has been authorized for detection and/or diagnosis of SARS-CoV-2 by FDA under an Emergency Use Authorization (EUA). This EUA will remain in effect (meaning this test can be used) for the duration of the COVID-19 declaration under Section 564(b)(1) of the Act, 21 U.S.C. section 360bbb-3(b)(1), unless the authorization is terminated or revoked.  Performed at [facility], [place] [address], [place], [place] [address]   MRSA Next Gen by PCR, Nasal     Status: None   Collection Time: 01/07/21  2:31 AM   Specimen: Nasal Mucosa; Nasal Swab  Result Value Ref Range Status   MRSA by PCR Next Gen NOT DETECTED NOT DETECTED Final    Comment: (NOTE) The GeneXpert MRSA Assay (FDA approved for NASAL specimens only), is one component of a comprehensive MRSA colonization surveillance program. It is not intended to diagnose MRSA infection nor to guide or monitor treatment for MRSA infections. Test performance is not FDA approved in patients less than 15 years old. Performed at  [facility], [place] [address], [place], [place] [address]   Culture, blood (routine x 2)     Status: None (Preliminary result)   Collection Time: 01/07/21  3:15 AM   Specimen: BLOOD  Result Value Ref Range Status   Specimen Description   Final    BLOOD BLOOD LEFT HAND Performed at [place] [address], [place], [place] [address]    Special Requests   Final    BOTTLES DRAWN AEROBIC ONLY Blood Culture adequate volume Performed at [place] [address] [place], [place] [address]    Culture   Final    NO GROWTH 3 DAYS Performed at [facility], [place]  [address], [place], [place] [address]    Report Status PENDING  Incomplete  Culture, blood (routine x 2)     Status: None (Preliminary result)   Collection Time: 01/07/21  3:15 AM   Specimen: BLOOD  Result Value Ref Range Status   Specimen Description   Final    BLOOD LEFT ANTECUBITAL Performed at [place] [address] [place], [place] [address]    Special Requests   Final    BOTTLES DRAWN AEROBIC ONLY Blood Culture adequate volume Performed at [facility] [address], [place], [place] [address]    Culture   Final    NO GROWTH 3 DAYS Performed at [facility], [place] [address], [place], [place] [address]    Report Status PENDING  Incomplete          Radiology Studies: No results found.      Scheduled Meds:  Chlorhexidine Gluconate Cloth  6 each Topical Q0600   levothyroxine  125 mcg Oral QAC breakfast   mouth rinse  15 mL Mouth Rinse BID   sodium chloride flush  10-40 mL Intracatheter Q12H   Warfarin - Pharmacist Dosing Inpatient   Does not apply q1600   Continuous Infusions:  cefTRIAXone (ROCEPHIN)  IV 2 g (01/10/21 0053)     LOS: 4 days    Time spent: 27 min    Georgette Shell, MD 01/10/2021, 12:36 PM

## 2021-01-10 NOTE — Progress Notes (Signed)
Patient wearing oxygen set at 1lpm with Sp02=98%. B/S clear and patient stated she was not short of breath at this time. Bipap not needed at this time.

## 2021-01-10 NOTE — Progress Notes (Signed)
ANTICOAGULATION CONSULT NOTE   Pharmacy Consult for Warfarin Indication: DVT  Patient Measurements: Height: '5\' 4"'$  (162.6 cm) Weight: 105.7 kg (233 lb 0.4 oz) IBW/kg (Calculated) : 54.7  Vital Signs: Temp: 98.4 F (36.9 C) (08/14 1339) Temp Source: Oral (08/14 1339) BP: 150/99 (08/14 1339) Pulse Rate: 90 (08/14 1339)  Labs: Recent Labs    01/08/21 0517 01/09/21 0249 01/10/21 0325  HGB 10.4* 10.6* 10.6*  HCT 33.7* 33.7* 33.5*  PLT 96* 112* 131*  LABPROT 41.6* 35.2* 31.3*  INR 4.3* 3.5* 3.0*  CREATININE 1.28* 1.09* 0.85     Estimated Creatinine Clearance: 61.5 mL/min (by C-G formula based on SCr of 0.85 mg/dL).   Medications:  PTA Warfarin '5mg'$  daily except 7.'5mg'$  MWF- dosed by Velora Heckler PCP per med rec but Rx sent by Dr Jana Hakim of Onc  Assessment: 81 yr female brought to ED s/p fall. PMH significant for breast cancer, hx DVT (on warfarin). Reported last dose taken 01/04/21 per med rec/son but per ED RN note pt stated she did not take it 8/7 or 8/8. Admit INR was subtherapeutic at 1.4. CT head on admission: No acute abnormality.  Pt underwent cystoscopy with retrograde pyelogram/ureteral stent placement on 8/11. Hgb decreased slightly post-procedure, FOBT negative.  Today, 01/10/2021:   INR = 3 is now on upper end of therapeutic range. CBC: Hgb (10.6) low but stable; Plt (131) low but trending up No bleeding reported No major DDI. Ceftriaxone does have some potential to enhance effects of INR. Diet: Heart healthy, 100% meal intake charted  Goal of Therapy:  INR 2-3  Plan:  Warfarin 5 mg PO this afternoon Recheck INR, CBC with AM labs tomorrow Monitor for signs of bleeding  Lenis Noon, PharmD 01/10/21 3:04 PM

## 2021-01-10 NOTE — Progress Notes (Addendum)
Urology Progress Note   3 Days Post-Op from right ureteral stent placement for obstructing infected UPJ stone.  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  Admitted for a fall..   Subjective: Remains afebrile hemodynamically stable.  White count has down trended to 10.  Creatinine continues to downtrend to 0.85.  Past urine culture grew Proteus.  Patient is on ceftriaxone.  Voiding spontaneously.  Objective: Vital signs in last 24 hours: Temp:  [97.7 F (36.5 C)-98 F (36.7 C)] 97.9 F (36.6 C) (08/14 0514) Pulse Rate:  [69-86] 86 (08/14 0514) Resp:  [16-19] 18 (08/14 0514) BP: (126-146)/(51-86) 146/68 (08/14 0514) SpO2:  [95 %-97 %] 96 % (08/14 0514)  Intake/Output from previous day: 08/13 0701 - 08/14 0700 In: 478 [P.O.:478] Out: 1175 [Urine:1175] Intake/Output this shift: Total I/O In: 240 [P.O.:240] Out: -   Physical Exam:  General: Alert and oriented CV: Regular rate Lungs: No increased work of breathing Abdomen: Soft, appropriately tender GU: Voiding spontaneously Ext: NT, No erythema  Lab Results: Recent Labs    01/08/21 0517 01/09/21 0249 01/10/21 0325  HGB 10.4* 10.6* 10.6*  HCT 33.7* 33.7* 33.5*    Recent Labs    01/09/21 0249 01/10/21 0325  NA 135 137  K 3.5 3.3*  CL 105 105  CO2 24 25  GLUCOSE 132* 109*  BUN 45* 33*  CREATININE 1.09* 0.85  CALCIUM 10.3 10.1     Studies/Results: No results found.  Assessment/Plan:  80 y.o. female s/p ureteral stent placement for right UPJ stone with superimposed UTI.  History of metastatic breast cancer and DVT on Coumadin.  Admitted since August 9 after a fall..  Overall doing better post-op.   -Cultures negative.  Previous culture was sensitive to ciprofloxacin in the past.  Recommend transitioning to oral ciprofloxacin 500 mg twice daily for a total of 2 weeks total of antibiotic therapy.  -We will arrange for definitive stone management as an outpatient with ureteroscopy  and laser lithotripsy.  Our schedulers have been messaged.  -No further urological recommendations.  Okay for discharge from a urological standpoint  Dispo: Per primary team   LOS: 4 days

## 2021-01-11 LAB — PROTIME-INR
INR: 3.2 — ABNORMAL HIGH (ref 0.8–1.2)
Prothrombin Time: 33.1 seconds — ABNORMAL HIGH (ref 11.4–15.2)

## 2021-01-11 MED ORDER — LIP MEDEX EX OINT
TOPICAL_OINTMENT | CUTANEOUS | Status: AC
  Filled 2021-01-11: qty 7

## 2021-01-11 MED ORDER — CEFDINIR 300 MG PO CAPS
300.0000 mg | ORAL_CAPSULE | Freq: Two times a day (BID) | ORAL | Status: DC
  Administered 2021-01-12: 300 mg via ORAL
  Filled 2021-01-11 (×2): qty 1

## 2021-01-11 NOTE — Progress Notes (Signed)
Occupational Therapy Treatment Patient Details Name: Alicia Vasquez [facility] MRN: [id number] DOB: [birth year] Today's Date: 01/11/2021    History of present illness 80 yo female admitted with ARF, fall. Hx of met breast cancer, DVT, L TKA 2018, L hip replacement   OT comments  Patient attempted sit to stand with mod a x 2 with pain reaching 12/10 in back and L hip. Patient was noted to have increased pain with movement on this date. Nurse made aware.patients session was dovetailed with PT on this date.  Patient attempted to participate in washing hair with shampoo cap seated in recliner on this date with min A to complete with patient reporting RTC tears in BUE. Patient's discharge plan remains appropriate at this time. OT will continue to follow acutely.    Follow Up Recommendations  SNF    Equipment Recommendations  None recommended by OT    Recommendations for Other Services      Precautions / Restrictions Precautions Precautions: Fall Restrictions Weight Bearing Restrictions: No       Mobility Bed Mobility               General bed mobility comments: up in recliner    Transfers Overall transfer level: Needs assistance Equipment used: Rolling walker (2 wheeled) Transfers: Sit to/from Stand Sit to Stand: +2 physical assistance;Mod assist         General transfer comment: O2 was noted to remain 96% on RA with activity. see ADL section.    Balance Overall balance assessment: History of Falls;Needs assistance Sitting-balance support: Feet supported       Standing balance support: Bilateral upper extremity supported Standing balance-Leahy Scale: Poor Standing balance comment: reliant on UE support and up to mod A                           ADL either performed or assessed with clinical judgement   ADL Overall ADL's : Needs assistance/impaired     Grooming: Set up;Sitting Grooming Details (indicate cue type and reason): patient was able to wash  face and comb portion of hair seated in recliner in room.   Upper Body Bathing Details (indicate cue type and reason): patient required min A for using shampoo cap to wash hair seated in recliner with patient reporting history of bilateral RTC tears impacting ability to particiapte. patient was quick to fatigue with max encouragement to trial tasks prior to asking for assistance.                         Functional mobility during ADLs: Moderate assistance;+2 for safety/equipment;Cueing for safety;Cueing for sequencing;Rolling walker General ADL Comments: patient attempted sit to stand from recliner to trial functional mobility with mod a x 2 with patietn unable to stand longer than 10 seconds with reports of back pin 12/10. nurse made aware.patient required continued education for proper hand placement.     Vision       Perception     Praxis      Cognition Arousal/Alertness: Awake/alert Behavior During Therapy: WFL for tasks assessed/performed Overall Cognitive Status: Within Functional Limits for tasks assessed                                 General Comments: patient is HOH with increased cues to make sure patient is understanding tasks.        Exercises  General Exercises - Lower Extremity Ankle Circles/Pumps: AROM;Both;10 reps;Seated Long Arc Quad: AROM;Both;10 reps;Seated   Shoulder Instructions       General Comments      Pertinent Vitals/ Pain       Pain Assessment: 0-10 Pain Score: 10-Worst pain ever Pain Location: L LE/ hip, back with movement Pain Descriptors / Indicators: Grimacing Pain Intervention(s): Limited activity within patient's tolerance;Monitored during session;Patient requesting pain meds-RN notified  Home Living                                          Prior Functioning/Environment              Frequency  Min 2X/week        Progress Toward Goals  OT Goals(current goals can now be found in the  care plan section)  Progress towards OT goals: Progressing toward goals  Acute Rehab OT Goals Patient Stated Goal: none stated  Plan Discharge plan remains appropriate    Co-evaluation                 AM-PAC OT "6 Clicks" Daily Activity     Outcome Measure   Help from another person eating meals?: None Help from another person taking care of personal grooming?: A Little Help from another person toileting, which includes using toliet, bedpan, or urinal?: A Lot Help from another person bathing (including washing, rinsing, drying)?: A Lot Help from another person to put on and taking off regular upper body clothing?: A Little Help from another person to put on and taking off regular lower body clothing?: A Lot 6 Click Score: 16    End of Session Equipment Utilized During Treatment: Rolling walker  OT Visit Diagnosis: Unsteadiness on feet (R26.81);Other abnormalities of gait and mobility (R26.89);History of falling (Z91.81);Pain Pain - Right/Left: Left Pain - part of body: Hip;Leg   Activity Tolerance Patient limited by pain   Patient Left in chair;with call bell/phone within reach;with chair alarm set   Nurse Communication Patient requests pain meds;Other (comment) (nurse cleared patient to participate)        Time: 1053-1101 OT Time Calculation (min): 8 min  Charges: OT General Charges $OT Visit: 1 Visit OT Treatments $Self Care/Home Management : 8-22 mins  Jackelyn Poling OTR/L, MS Acute Rehabilitation Department Office# [phone] Pager# [phone]    [place] 01/11/2021, 12:54 PM

## 2021-01-11 NOTE — Progress Notes (Signed)
Physical Therapy Treatment Patient Details Name: Alicia Vasquez [facility] MRN: [id number] DOB: [birth year] Today's Date: 01/11/2021    History of Present Illness 80 yo female admitted with ARF, fall. Hx of met breast cancer, DVT, L TKA 2018, L hip replacement    PT Comments    Sit to stand x 2 trials with mod assist of 2 and RW. Pt only able to stand for ~10 seconds, tolerance limited by "12/10" back pain. Pain medication requested. Instructed pt in BLE strengthening exercises. PT continues to recommend SNF.     Follow Up Recommendations  SNF     Equipment Recommendations  None recommended by PT    Recommendations for Other Services OT consult     Precautions / Restrictions Precautions Precautions: Fall Restrictions Weight Bearing Restrictions: No    Mobility  Bed Mobility               General bed mobility comments: up in recliner    Transfers Overall transfer level: Needs assistance Equipment used: Rolling walker (2 wheeled) Transfers: Sit to/from Stand Sit to Stand: +2 physical assistance;Mod assist         General transfer comment: assist to rise from recliner x 2 trials, VCs hand placement, increased time/effort. Pt only able to stand for ~10 seconds, tolerance limited by back pain. RN notified of pt request for pain medication. SaO2 96% on RA with activity.  Ambulation/Gait                 Stairs             Wheelchair Mobility    Modified Rankin (Stroke Patients Only)       Balance Overall balance assessment: History of Falls;Needs assistance Sitting-balance support: Feet supported       Standing balance support: Bilateral upper extremity supported Standing balance-Leahy Scale: Poor Standing balance comment: reliant on UE support and up to mod A                            Cognition Arousal/Alertness: Awake/alert Behavior During Therapy: WFL for tasks assessed/performed Overall Cognitive Status: Within Functional  Limits for tasks assessed                                        Exercises General Exercises - Lower Extremity Ankle Circles/Pumps: AROM;Both;10 reps;Seated Long Arc Quad: AROM;Both;10 reps;Seated    General Comments        Pertinent Vitals/Pain Pain Score: 10-Worst pain ever Pain Location: L LE/ hip, back with movement Pain Descriptors / Indicators: Grimacing Pain Intervention(s): Limited activity within patient's tolerance;Patient requesting pain meds-RN notified;Monitored during session;Repositioned    Home Living                      Prior Function            PT Goals (current goals can now be found in the care plan section) Acute Rehab PT Goals Patient Stated Goal: none stated PT Goal Formulation: With patient Time For Goal Achievement: 01/19/21 Potential to Achieve Goals: Good Progress towards PT goals: Progressing toward goals    Frequency    Min 2X/week      PT Plan Current plan remains appropriate    Co-evaluation              AM-PAC PT "6 Clicks" Mobility  Outcome Measure  Help needed turning from your back to your side while in a flat bed without using bedrails?: Total Help needed moving from lying on your back to sitting on the side of a flat bed without using bedrails?: Total Help needed moving to and from a bed to a chair (including a wheelchair)?: A Lot Help needed standing up from a chair using your arms (e.g., wheelchair or bedside chair)?: Total Help needed to walk in hospital room?: Total Help needed climbing 3-5 steps with a railing? : Total 6 Click Score: 7    End of Session Equipment Utilized During Treatment: Gait belt Activity Tolerance: Patient limited by pain Patient left: with call bell/phone within reach;in chair;with chair alarm set Nurse Communication: Mobility status PT Visit Diagnosis: Muscle weakness (generalized) (M62.81);Pain;Difficulty in walking, not elsewhere classified (R26.2);History  of falling (Z91.81)     Time: 2992-4268 PT Time Calculation (min) (ACUTE ONLY): 30 min  Charges:  $Gait Training: 8-22 mins $Therapeutic Exercise: 8-22 mins                    Blondell Reveal Kistler PT 01/11/2021  Acute Rehabilitation Services Pager [phone] Office [phone]

## 2021-01-11 NOTE — Progress Notes (Signed)
PROGRESS NOTE    Alicia Vasquez  [id number] DOB: 1940-07-21 DOA: 01/04/2021 PCP: [name], MD   Brief Narrative: 80 year old female lives at home with her friend.  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  Patient has been having nausea vomiting decreased p.o. intake.  She tripped and fell and hit her head did not lose consciousness.  CT of the head and spine unremarkable.  She was found to be in AKI with leukocytosis and UA consistent with UTI.  Patient was started on Rocephin.  Her prior urine culture grew Proteus Mirabella's sensitive to Rocephin.  She is being admitted for AKI and UTI.  She was seen by physical therapy recommending patient needs SNF.  Assessment & Plan:   Principal Problem:   ARF (acute renal failure) (HCC) Active Problems:   Hypothyroid   HTN (hypertension)   Primary hypercoagulable state ([place])   DVT, lower extremity ([place])   AKI (acute kidney injury) ([place])    #1 AKI -resolved.  Secondary to obstructive uropathy and dehydration decreased p.o. intake.Creatinine 0.85 from 1.28 from 1.51.    #2 urinary tract infection UA consistent with UTI  CT shows obstructive renal stone on Rocephin  Leukocytosis resolved Status post stent in the right kidney 01/07/2021 Foley  Dc'd 01/09/2021 I do not see a urine culture from this admission.  However her blood cultures have been negative.  Previous urine culture from 12/16/2020 grew Proteus resistant only to nitrofurantoin. Leukocytosis resolved   #3 DVT on Coumadin dosing by pharmacy.  INR 3.2 from 3.5  From 4.3.  Coumadin on hold.  #4 hypothyroidism on Synthroid  #5 metastatic breast cancer followed by oncology  #6 history of hyperparathyroidism followed by endocrinology  #7 severe deconditioning and generalized weakness PT consulted recommending SNF.  #8 lumbar compression fracture -MRI lumbar spine  acute to subacute compression fracture involving the superior endplate of L4 and L5.  Chronic T12 compression fracture.  Will refer her to interventional radiology upon discharge.  #9 anemia hemoglobin 10.4 from 12.0.  Possible hemodilution as she was getting IV fluids for dehydration.  No evidence of active bleeding or gross bleeding noted.  FOBT negative.  Hemoglobin remained stable.  #10 hypokalemia replete and recheck labs check magnesium.  No labs today will recheck BMP in a.m. with magnesium.  #11 goals of care patient is full code COVID test ordered today for discharge tomorrow to SNF.  Estimated body mass index is 40 kg/m as calculated from the following:   Height as of this encounter: '5\' 4"'$  (1.626 m).   Weight as of this encounter: 105.7 kg.  DVT prophylaxis: Coumadin  code Status: Full code Family Communication: None at bedside Disposition Plan:  Status is: Inpatient  Remains inpatient appropriate because:IV treatments appropriate due to intensity of illness or inability to take PO  Dispo: The patient is from: Home              Anticipated d/c is to: SNF              Patient currently is medically stable for discharge to SNF   Difficult to place patient No Consultants:  None  Procedures: None Antimicrobials: Rocephin  Subjective: Patient is resting in recliner.  Staff reported she complained of back pain overnight.  Urine is clear.  Objective: Vitals:   01/10/21 1339 01/10/21 2031 01/11/21 0507 01/11/21 1106  BP: (!) 150/99 (!) 154/49 140/61  Pulse: 90 93 85   Resp: '20 20 18   '$ Temp: 98.4 F (36.9 C) 98 F (36.7 C) 98.2 F (36.8 C)   TempSrc: Oral Oral Oral   SpO2: 100% 98% 100% 96%  Weight:      Height:        Intake/Output Summary (Last 24 hours) at 01/11/2021 1246 Last data filed at 01/11/2021 1100 Gross per 24 hour  Intake 178 ml  Output 1200 ml  Net -1022 ml    Filed Weights   01/04/21 2217 01/05/21 0257 01/07/21 0400  Weight: 102.1 kg 99.5 kg  105.7 kg    Examination:  General exam: Appears calm and comfortable  Respiratory system: Clear to auscultation. Respiratory effort normal. Cardiovascular system: S1 & S2 heard, RRR. No JVD, murmurs, rubs, gallops or clicks. No pedal edema. Gastrointestinal system: Abdomen is distended, soft and nontender. No organomegaly or masses felt. Normal bowel sounds heard. Central nervous system: Awake alert and oriented extremities: Trace edema bilaterally Skin: No rashes, lesions or ulcers Psychiatry: Judgement and insight appear normal. Mood & affect appropriate.     Data Reviewed: I have personally reviewed following labs and imaging studies  CBC: Recent Labs  Lab 01/06/21 0352 01/07/21 0317 01/08/21 0517 01/09/21 0249 01/10/21 0325  WBC 12.1* 11.4* 15.7* 15.5* 10.8*  NEUTROABS 10.1* 10.1* 13.7* 12.7* 8.0*  HGB 12.9 12.0 10.4* 10.6* 10.6*  HCT 41.5 37.9 33.7* 33.7* 33.5*  MCV 91.6 90.0 90.6 90.1 88.4  PLT 164 127* 96* 112* 131*    Basic Metabolic Panel: Recent Labs  Lab 01/06/21 0352 01/07/21 0317 01/08/21 0517 01/09/21 0249 01/10/21 0325  NA 138 138 139 135 137  K 3.9 3.7 4.1 3.5 3.3*  CL 110 108 107 105 105  CO2 18* 20* '22 24 25  '$ GLUCOSE 121* 151* 150* 132* 109*  BUN 32* 34* 38* 45* 33*  CREATININE 1.35* 1.51* 1.28* 1.09* 0.85  CALCIUM 10.3 10.5* 10.3 10.3 10.1  MG 2.1 2.0 2.2 2.1 1.9    GFR: Estimated Creatinine Clearance: 61.5 mL/min (by C-G formula based on SCr of 0.85 mg/dL). Liver Function Tests: Recent Labs  Lab 01/04/21 2330  AST 20  ALT 20  ALKPHOS 91  BILITOT 1.6*  PROT 7.3  ALBUMIN 3.8    No results for input(s): LIPASE, AMYLASE in the last 168 hours. No results for input(s): AMMONIA in the last 168 hours. Coagulation Profile: Recent Labs  Lab 01/07/21 0552 01/08/21 0517 01/09/21 0249 01/10/21 0325 01/11/21 0433  INR 2.4* 4.3* 3.5* 3.0* 3.2*    Cardiac Enzymes: No results for input(s): CKTOTAL, CKMB, CKMBINDEX, TROPONINI in the  last 168 hours. BNP (last 3 results) No results for input(s): PROBNP in the last 8760 hours. HbA1C: No results for input(s): HGBA1C in the last 72 hours. CBG: Recent Labs  Lab 01/09/21 0800  GLUCAP 104*    Lipid Profile: No results for input(s): CHOL, HDL, LDLCALC, TRIG, CHOLHDL, LDLDIRECT in the last 72 hours. Thyroid Function Tests: No results for input(s): TSH, T4TOTAL, FREET4, T3FREE, THYROIDAB in the last 72 hours. Anemia Panel: No results for input(s): VITAMINB12, FOLATE, FERRITIN, TIBC, IRON, RETICCTPCT in the last 72 hours. Sepsis Labs: Recent Labs  Lab 01/07/21 0317 01/07/21 0545 01/07/21 0932  LATICACIDVEN 2.0* 2.9* 1.4     Recent Results (from the past 240 hour(s))  Resp Panel by RT-PCR (Flu A&B, Covid) Nasopharyngeal Swab     Status: None   Collection Time: 01/04/21 11:30 PM   Specimen: Nasopharyngeal Swab; Nasopharyngeal(NP) swabs  in vial transport medium  Result Value Ref Range Status   SARS Coronavirus 2 by RT PCR NEGATIVE NEGATIVE Final    Comment: (NOTE) SARS-CoV-2 target nucleic acids are NOT DETECTED.  The SARS-CoV-2 RNA is generally detectable in upper respiratory specimens during the acute phase of infection. The lowest concentration of SARS-CoV-2 viral copies this assay can detect is 138 copies/mL. A negative result does not preclude SARS-Cov-2 infection and should not be used as the sole basis for treatment or other patient management decisions. A negative result may occur with  improper specimen collection/handling, submission of specimen other than nasopharyngeal swab, presence of viral mutation(s) within the areas targeted by this assay, and inadequate number of viral copies(<138 copies/mL). A negative result must be combined with clinical observations, patient history, and epidemiological information. The expected result is Negative.  Fact Sheet for Patients:  EntrepreneurPulse.com.au  Fact Sheet for Healthcare  Providers:  IncredibleEmployment.be  This test is no t yet approved or cleared by the [place] FDA and  has been authorized for detection and/or diagnosis of SARS-CoV-2 by FDA under an Emergency Use Authorization (EUA). This EUA will remain  in effect (meaning this test can be used) for the duration of the COVID-19 declaration under Section 564(b)(1) of the Act, 21 U.S.C.section 360bbb-3(b)(1), unless the authorization is terminated  or revoked sooner.       Influenza A by PCR NEGATIVE NEGATIVE Final   Influenza B by PCR NEGATIVE NEGATIVE Final    Comment: (NOTE) The Xpert Xpress SARS-CoV-2/FLU/RSV plus assay is intended as an aid in the diagnosis of influenza from Nasopharyngeal swab specimens and should not be used as a sole basis for treatment. Nasal washings and aspirates are unacceptable for Xpert Xpress SARS-CoV-2/FLU/RSV testing.  Fact Sheet for Patients: EntrepreneurPulse.com.au  Fact Sheet for Healthcare Providers: IncredibleEmployment.be  This test is not yet approved or cleared by the [place] FDA and has been authorized for detection and/or diagnosis of SARS-CoV-2 by FDA under an Emergency Use Authorization (EUA). This EUA will remain in effect (meaning this test can be used) for the duration of the COVID-19 declaration under Section 564(b)(1) of the Act, 21 U.S.C. section 360bbb-3(b)(1), unless the authorization is terminated or revoked.  Performed at [facility], [place] [address] [place], [place] [address]   MRSA Next Gen by PCR, Nasal     Status: None   Collection Time: 01/07/21  2:31 AM   Specimen: Nasal Mucosa; Nasal Swab  Result Value Ref Range Status   MRSA by PCR Next Gen NOT DETECTED NOT DETECTED Final    Comment: (NOTE) The GeneXpert MRSA Assay (FDA approved for NASAL specimens only), is one component of a comprehensive MRSA colonization surveillance program. It is  not intended to diagnose MRSA infection nor to guide or monitor treatment for MRSA infections. Test performance is not FDA approved in patients less than 3 years old. Performed at [facility], [place] [address], [place], [place] [address]   Culture, blood (routine x 2)     Status: None (Preliminary result)   Collection Time: 01/07/21  3:15 AM   Specimen: BLOOD  Result Value Ref Range Status   Specimen Description   Final    BLOOD BLOOD LEFT HAND Performed at [facility] [address], [place], [place] [address]    Special Requests   Final    BOTTLES DRAWN AEROBIC ONLY Blood Culture adequate volume Performed at [facility] [address] [place],  [name] [address]    Culture   Final    NO GROWTH 4 DAYS Performed at [facility], [place] [address] [place], [place] [address]    Report Status PENDING  Incomplete  Culture, blood (routine x 2)     Status: None (Preliminary result)   Collection Time: 01/07/21  3:15 AM   Specimen: BLOOD  Result Value Ref Range Status   Specimen Description   Final    BLOOD LEFT ANTECUBITAL Performed at [facility] [address] [place], [place] [address]    Special Requests   Final    BOTTLES DRAWN AEROBIC ONLY Blood Culture adequate volume Performed at [facility] [name] [place], [place] [address]    Culture   Final    NO GROWTH 4 DAYS Performed at [facility], [place] [address], [place], [place] [address]    Report Status PENDING  Incomplete          Radiology Studies: No results found.      Scheduled Meds:  levothyroxine  125 mcg Oral QAC breakfast   mouth rinse  15 mL Mouth Rinse BID   sodium chloride flush  10-40 mL Intracatheter Q12H   Warfarin - Pharmacist Dosing Inpatient   Does not apply q1600   Continuous Infusions:  cefTRIAXone (ROCEPHIN)  IV 2 g (01/11/21 0043)     LOS: 5 days    Time spent:  56 min    Georgette Shell, MD 01/11/2021, 12:46 PM

## 2021-01-11 NOTE — Care Management Important Message (Signed)
Important Message  Patient Details IM Letter given to the Patient. Name: Alicia Vasquez [facility] MRN: [id number] Date of Birth: 10/07/1940   Medicare Important Message Given:  Yes     Kerin [place]en 01/11/2021, 12:11 PM

## 2021-01-11 NOTE — Progress Notes (Signed)
[place] for Warfarin Indication: DVT  Patient Measurements: Height: '5\' 4"'$  (162.6 cm) Weight: 105.7 kg (233 lb 0.4 oz) IBW/kg (Calculated) : 54.7  Vital Signs: Temp: 98.2 F (36.8 C) (08/15 0507) Temp Source: Oral (08/15 0507) BP: 140/61 (08/15 0507) Pulse Rate: 85 (08/15 0507)  Labs: Recent Labs    01/09/21 0249 01/10/21 0325 01/11/21 0433  HGB 10.6* 10.6*  --   HCT 33.7* 33.5*  --   PLT 112* 131*  --   LABPROT 35.2* 31.3* 33.1*  INR 3.5* 3.0* 3.2*  CREATININE 1.09* 0.85  --      Estimated Creatinine Clearance: 61.5 mL/min (by C-G formula based on SCr of 0.85 mg/dL).   Medications:  PTA Warfarin '5mg'$  daily except 7.'5mg'$  MWF- dosed by Velora Heckler PCP per med rec but Rx sent by Dr Jana Hakim of Onc  Assessment: 80 yr female brought to ED s/p fall. PMH significant for breast cancer, hx DVT (on warfarin). Reported last dose taken 01/04/21 per med rec/son but per ED RN note pt stated she did not take it 8/7 or 8/8. Admit INR was subtherapeutic at 1.4. CT head on admission: No acute abnormality.  Pt underwent cystoscopy with retrograde pyelogram/ureteral stent placement on 8/11. Hgb decreased slightly post-procedure, FOBT negative.  Today, 01/11/2021:   INR = 3.2 is now supra-therapeutic again CBC: Hgb (10.6) low but stable; Plt (131) low but trending up (labs from 8/14) No bleeding reported No major DDI. Ceftriaxone does have some potential to enhance effects of INR. Diet: Heart healthy, 75% meal intake charted  Goal of Therapy:  INR 2-3  Plan:  Hold warfarin today Recheck INR with AM labs tomorrow Monitor for signs of bleeding  [name] Form 01/11/21 12:25 PM

## 2021-01-12 DIAGNOSIS — N390 Urinary tract infection, site not specified: Secondary | ICD-10-CM | POA: Diagnosis not present

## 2021-01-12 DIAGNOSIS — F339 Major depressive disorder, recurrent, unspecified: Secondary | ICD-10-CM | POA: Diagnosis not present

## 2021-01-12 DIAGNOSIS — N202 Calculus of kidney with calculus of ureter: Secondary | ICD-10-CM | POA: Diagnosis not present

## 2021-01-12 DIAGNOSIS — T148XXD Other injury of unspecified body region, subsequent encounter: Secondary | ICD-10-CM | POA: Diagnosis not present

## 2021-01-12 DIAGNOSIS — R0602 Shortness of breath: Secondary | ICD-10-CM | POA: Diagnosis not present

## 2021-01-12 DIAGNOSIS — Z170 Estrogen receptor positive status [ER+]: Secondary | ICD-10-CM | POA: Diagnosis not present

## 2021-01-12 DIAGNOSIS — Z86711 Personal history of pulmonary embolism: Secondary | ICD-10-CM | POA: Diagnosis not present

## 2021-01-12 DIAGNOSIS — Z7401 Bed confinement status: Secondary | ICD-10-CM | POA: Diagnosis not present

## 2021-01-12 DIAGNOSIS — C7951 Secondary malignant neoplasm of bone: Secondary | ICD-10-CM | POA: Diagnosis not present

## 2021-01-12 DIAGNOSIS — H2513 Age-related nuclear cataract, bilateral: Secondary | ICD-10-CM | POA: Diagnosis not present

## 2021-01-12 DIAGNOSIS — C50919 Malignant neoplasm of unspecified site of unspecified female breast: Secondary | ICD-10-CM | POA: Diagnosis not present

## 2021-01-12 DIAGNOSIS — R2681 Unsteadiness on feet: Secondary | ICD-10-CM | POA: Diagnosis not present

## 2021-01-12 DIAGNOSIS — M7989 Other specified soft tissue disorders: Secondary | ICD-10-CM | POA: Diagnosis not present

## 2021-01-12 DIAGNOSIS — M6281 Muscle weakness (generalized): Secondary | ICD-10-CM | POA: Diagnosis not present

## 2021-01-12 DIAGNOSIS — R41841 Cognitive communication deficit: Secondary | ICD-10-CM | POA: Diagnosis not present

## 2021-01-12 DIAGNOSIS — I82409 Acute embolism and thrombosis of unspecified deep veins of unspecified lower extremity: Secondary | ICD-10-CM | POA: Diagnosis not present

## 2021-01-12 DIAGNOSIS — E785 Hyperlipidemia, unspecified: Secondary | ICD-10-CM | POA: Diagnosis not present

## 2021-01-12 DIAGNOSIS — Z7901 Long term (current) use of anticoagulants: Secondary | ICD-10-CM | POA: Diagnosis not present

## 2021-01-12 DIAGNOSIS — N3000 Acute cystitis without hematuria: Secondary | ICD-10-CM | POA: Diagnosis not present

## 2021-01-12 DIAGNOSIS — C50411 Malignant neoplasm of upper-outer quadrant of right female breast: Secondary | ICD-10-CM | POA: Diagnosis not present

## 2021-01-12 DIAGNOSIS — Z741 Need for assistance with personal care: Secondary | ICD-10-CM | POA: Diagnosis not present

## 2021-01-12 DIAGNOSIS — E213 Hyperparathyroidism, unspecified: Secondary | ICD-10-CM | POA: Diagnosis not present

## 2021-01-12 DIAGNOSIS — Z86718 Personal history of other venous thrombosis and embolism: Secondary | ICD-10-CM | POA: Diagnosis not present

## 2021-01-12 DIAGNOSIS — Z91040 Latex allergy status: Secondary | ICD-10-CM | POA: Diagnosis not present

## 2021-01-12 DIAGNOSIS — K1370 Unspecified lesions of oral mucosa: Secondary | ICD-10-CM | POA: Diagnosis not present

## 2021-01-12 DIAGNOSIS — W19XXXA Unspecified fall, initial encounter: Secondary | ICD-10-CM | POA: Diagnosis not present

## 2021-01-12 DIAGNOSIS — I209 Angina pectoris, unspecified: Secondary | ICD-10-CM | POA: Diagnosis not present

## 2021-01-12 DIAGNOSIS — M25562 Pain in left knee: Secondary | ICD-10-CM | POA: Diagnosis not present

## 2021-01-12 DIAGNOSIS — Z466 Encounter for fitting and adjustment of urinary device: Secondary | ICD-10-CM | POA: Diagnosis not present

## 2021-01-12 DIAGNOSIS — F064 Anxiety disorder due to known physiological condition: Secondary | ICD-10-CM | POA: Diagnosis not present

## 2021-01-12 DIAGNOSIS — E039 Hypothyroidism, unspecified: Secondary | ICD-10-CM | POA: Diagnosis not present

## 2021-01-12 DIAGNOSIS — N179 Acute kidney failure, unspecified: Secondary | ICD-10-CM | POA: Diagnosis not present

## 2021-01-12 DIAGNOSIS — M25552 Pain in left hip: Secondary | ICD-10-CM | POA: Diagnosis not present

## 2021-01-12 DIAGNOSIS — M4856XA Collapsed vertebra, not elsewhere classified, lumbar region, initial encounter for fracture: Secondary | ICD-10-CM | POA: Diagnosis not present

## 2021-01-12 DIAGNOSIS — D649 Anemia, unspecified: Secondary | ICD-10-CM | POA: Diagnosis not present

## 2021-01-12 DIAGNOSIS — K121 Other forms of stomatitis: Secondary | ICD-10-CM | POA: Diagnosis not present

## 2021-01-12 DIAGNOSIS — Z01818 Encounter for other preprocedural examination: Secondary | ICD-10-CM | POA: Diagnosis not present

## 2021-01-12 DIAGNOSIS — Z1231 Encounter for screening mammogram for malignant neoplasm of breast: Secondary | ICD-10-CM | POA: Diagnosis not present

## 2021-01-12 DIAGNOSIS — S32050A Wedge compression fracture of fifth lumbar vertebra, initial encounter for closed fracture: Secondary | ICD-10-CM | POA: Diagnosis not present

## 2021-01-12 DIAGNOSIS — W19XXXD Unspecified fall, subsequent encounter: Secondary | ICD-10-CM | POA: Diagnosis not present

## 2021-01-12 DIAGNOSIS — R5381 Other malaise: Secondary | ICD-10-CM | POA: Diagnosis not present

## 2021-01-12 DIAGNOSIS — R262 Difficulty in walking, not elsewhere classified: Secondary | ICD-10-CM | POA: Diagnosis not present

## 2021-01-12 DIAGNOSIS — R278 Other lack of coordination: Secondary | ICD-10-CM | POA: Diagnosis not present

## 2021-01-12 DIAGNOSIS — M79605 Pain in left leg: Secondary | ICD-10-CM | POA: Diagnosis not present

## 2021-01-12 DIAGNOSIS — F419 Anxiety disorder, unspecified: Secondary | ICD-10-CM | POA: Diagnosis not present

## 2021-01-12 DIAGNOSIS — Z853 Personal history of malignant neoplasm of breast: Secondary | ICD-10-CM | POA: Diagnosis not present

## 2021-01-12 DIAGNOSIS — S22080A Wedge compression fracture of T11-T12 vertebra, initial encounter for closed fracture: Secondary | ICD-10-CM | POA: Diagnosis not present

## 2021-01-12 DIAGNOSIS — S0093XA Contusion of unspecified part of head, initial encounter: Secondary | ICD-10-CM | POA: Diagnosis not present

## 2021-01-12 DIAGNOSIS — R5383 Other fatigue: Secondary | ICD-10-CM | POA: Diagnosis not present

## 2021-01-12 DIAGNOSIS — J3489 Other specified disorders of nose and nasal sinuses: Secondary | ICD-10-CM | POA: Diagnosis not present

## 2021-01-12 DIAGNOSIS — N132 Hydronephrosis with renal and ureteral calculous obstruction: Secondary | ICD-10-CM | POA: Diagnosis not present

## 2021-01-12 DIAGNOSIS — S32040A Wedge compression fracture of fourth lumbar vertebra, initial encounter for closed fracture: Secondary | ICD-10-CM | POA: Diagnosis not present

## 2021-01-12 DIAGNOSIS — H5203 Hypermetropia, bilateral: Secondary | ICD-10-CM | POA: Diagnosis not present

## 2021-01-12 DIAGNOSIS — Z888 Allergy status to other drugs, medicaments and biological substances status: Secondary | ICD-10-CM | POA: Diagnosis not present

## 2021-01-12 DIAGNOSIS — I10 Essential (primary) hypertension: Secondary | ICD-10-CM | POA: Diagnosis not present

## 2021-01-12 DIAGNOSIS — Z884 Allergy status to anesthetic agent status: Secondary | ICD-10-CM | POA: Diagnosis not present

## 2021-01-12 DIAGNOSIS — Z882 Allergy status to sulfonamides status: Secondary | ICD-10-CM | POA: Diagnosis not present

## 2021-01-12 DIAGNOSIS — R110 Nausea: Secondary | ICD-10-CM | POA: Diagnosis not present

## 2021-01-12 DIAGNOSIS — M6259 Muscle wasting and atrophy, not elsewhere classified, multiple sites: Secondary | ICD-10-CM | POA: Diagnosis not present

## 2021-01-12 DIAGNOSIS — S32000D Wedge compression fracture of unspecified lumbar vertebra, subsequent encounter for fracture with routine healing: Secondary | ICD-10-CM | POA: Diagnosis not present

## 2021-01-12 LAB — PROTIME-INR
INR: 2.9 — ABNORMAL HIGH (ref 0.8–1.2)
Prothrombin Time: 30 seconds — ABNORMAL HIGH (ref 11.4–15.2)

## 2021-01-12 LAB — CULTURE, BLOOD (ROUTINE X 2)
Culture: NO GROWTH
Culture: NO GROWTH
Special Requests: ADEQUATE
Special Requests: ADEQUATE

## 2021-01-12 LAB — SARS CORONAVIRUS 2 (TAT 6-24 HRS): SARS Coronavirus 2: NEGATIVE

## 2021-01-12 MED ORDER — WARFARIN SODIUM 2.5 MG PO TABS: 2.5000 mg | ORAL_TABLET | Freq: Once | ORAL | 0 refills | Status: DC

## 2021-01-12 MED ORDER — POTASSIUM CHLORIDE CRYS ER 10 MEQ PO TBCR
40.0000 meq | EXTENDED_RELEASE_TABLET | Freq: Once | ORAL | Status: AC
  Administered 2021-01-12: 40 meq via ORAL
  Filled 2021-01-12: qty 4

## 2021-01-12 MED ORDER — CEFDINIR 300 MG PO CAPS: 300.0000 mg | ORAL_CAPSULE | Freq: Two times a day (BID) | ORAL | 0 refills | Status: DC

## 2021-01-12 MED ORDER — HYDROCODONE-ACETAMINOPHEN 5-325 MG PO TABS: 1.0000 | ORAL_TABLET | ORAL | 0 refills | Status: AC | PRN

## 2021-01-12 MED ORDER — WARFARIN SODIUM 2.5 MG PO TABS: 2.5000 mg | ORAL_TABLET | Freq: Once | ORAL | Status: DC

## 2021-01-12 NOTE — TOC Transition Note (Signed)
Transition of Care [facility]) - CM/SW Discharge Note   Patient Details  Name: Alicia Vasquez [facility] MRN: [id number] Date of Birth: Sep 14, 1940  Transition of Care [facility]) CM/SW Contact:  Trish Mage, LCSW Phone Number: 01/12/2021, 10:14 AM   Clinical Narrative:  Patient who is stable for d/c will transfer to [facility] today.  Family alerted.  PTAR arranged.  Nursing, please call report to [phone], room 3217. TOC sign off.     Final next level of care: Skilled Nursing Facility Barriers to Discharge: Barriers Resolved   Patient Goals and CMS Choice Patient states their goals for this hospitalization and ongoing recovery are:: Walk better CMS Medicare.gov Compare Post Acute Care list provided to:: Patient Choice offered to / list presented to : Patient  Discharge Placement                       Discharge Plan and Services In-house Referral: Clinical Social Work   Post Acute Care Choice: [place]          DME Arranged: N/A DME Agency: NA                  Social Determinants of Health ([place]) Interventions     Readmission Risk Interventions No flowsheet data found.

## 2021-01-12 NOTE — Progress Notes (Signed)
Pt. Discharged via stretched through [facility]. Patient is alert and oriented, no distress. Report called to receiving facility. Patient aware of the transfer. All patient personal belongings are sent together with the patient.

## 2021-01-12 NOTE — Progress Notes (Signed)
Alicia Vasquez for Warfarin Indication: DVT  Patient Measurements: Height: '5\' 4"'$  (162.6 cm) Weight: 105.7 kg (233 lb 0.4 oz) IBW/kg (Calculated) : 54.7  Vital Signs: Temp: 98.5 F (36.9 C) (08/16 0443) BP: 160/74 (08/16 0443) Pulse Rate: 76 (08/16 0443)  Labs: Recent Labs    01/10/21 0325 01/11/21 0433 01/12/21 0426  HGB 10.6*  --   --   HCT 33.5*  --   --   PLT 131*  --   --   LABPROT 31.3* 33.1* 30.0*  INR 3.0* 3.2* 2.9*  CREATININE 0.85  --   --      Estimated Creatinine Clearance: 61.5 mL/min (by C-G formula based on SCr of 0.85 mg/dL).   Medications:  PTA Warfarin '5mg'$  daily except 7.'5mg'$  MWF- dosed by Velora Heckler PCP per med rec but Rx sent by Dr Jana Hakim of Onc  Assessment: 80 yr female brought to ED s/p fall. PMH significant for breast cancer, hx DVT (on warfarin). Reported last dose taken 01/04/21 per med rec/son but per ED RN note pt stated she did not take it 8/7 or 8/8. Admit INR was subtherapeutic at 1.4. CT head on admission: No acute abnormality.  Pt underwent cystoscopy with retrograde pyelogram/ureteral stent placement on 8/11. Hgb decreased slightly post-procedure, FOBT negative.  Today, 01/12/2021:   INR = 2.9 is now at high end of therapeutic range after holding dose yesterday CBC: Hgb (10.6) low but stable; Plt (131) low but trending up (labs from 8/14) No bleeding reported No major DDI. Ceftriaxone does have some potential to enhance effects of INR. Diet: Heart healthy, 75% meal intake charted  Goal of Therapy:  INR 2-3  Plan:  Warfarin 2.5 mg today Recheck INR with AM labs tomorrow Monitor for signs of bleeding  [name] Form 01/12/21 8:13 AM

## 2021-01-12 NOTE — Plan of Care (Signed)
  Problem: Education: Goal: Knowledge of General Education information will improve Description: Including pain rating scale, medication(s)/side effects and non-pharmacologic comfort measures Outcome: Adequate for Discharge   Problem: Health Behavior/Discharge Planning: Goal: Ability to manage health-related needs will improve Outcome: Adequate for Discharge   Problem: Clinical Measurements: Goal: Ability to maintain clinical measurements within normal limits will improve Outcome: Adequate for Discharge Goal: Will remain free from infection Outcome: Adequate for Discharge Goal: Diagnostic test results will improve Outcome: Adequate for Discharge   Problem: Activity: Goal: Risk for activity intolerance will decrease Outcome: Adequate for Discharge   Problem: Nutrition: Goal: Adequate nutrition will be maintained Outcome: Adequate for Discharge

## 2021-01-12 NOTE — Progress Notes (Addendum)
RN called to give a report to [facility] no staff is available to receive as per Network engineer. RN left number to call back once receiving nurse is ready for report. Will try again latter.  @ 1330hr report called to receiving RN.

## 2021-01-12 NOTE — Discharge Summary (Signed)
Physician Discharge Summary  Alicia Vasquez [id number] DOB: [birth year] DOA: 01/04/2021  PCP: [name], MD  Admit date: 01/04/2021 Discharge date: 01/12/2021  Admitted From: Home Disposition skilled nursing facility  Recommendations for Outpatient Follow-up:  Follow up with PCP in 1-2 weeks Please obtain BMP/CBC in one week Please check INR Monday Wednesday and Friday as long as she is on antibiotics.  Once the antibiotics are finished check INR Monday and Thursday until she is stable with therapeutic INR Please follow up with urologist, interventional radiology  Home Health: None Equipment/Devices: None Discharge Condition: Stable CODE STATUS: Full code Diet recommendation: Cardiac diet Brief/Interim Summary 80 year old female lives at home with her friend.  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  Patient has been having nausea vomiting decreased p.o. intake.  She tripped and fell and hit her head did not lose consciousness.  CT of the head and spine unremarkable.  She was found to be in AKI with leukocytosis and UA consistent with UTI.  Patient was started on Rocephin.  Her prior urine culture grew Proteus Mirabella's sensitive to Rocephin.  She is being admitted for AKI and UTI.  She was seen by physical therapy recommending patient needs SNF.:  Discharge Diagnoses:  Principal Problem:   ARF (acute renal failure) ([place]) Active Problems:   Hypothyroid   HTN (hypertension)   Primary hypercoagulable state ([place])   DVT, lower extremity ([place])   AKI (acute kidney injury) ([place])  #1 AKI -resolved.  Secondary to obstructive uropathy and dehydration decreased p.o. intake.Creatinine 0.85 from 1.28 from 1.51.     #2 urinary tract infection UA consistent with UTI  CT shows obstructive renal stone.  She is status post stent placement in the right kidney on 01/07/2021.  She  was treated with Rocephin.  She will be discharged on cefdinir 300 mg twice a day for another 1 week.  #3 DVT on Coumadin.  On the day of discharge her INR was 2.9.  Monitor her INR Monday Wednesday and Friday while she is on antibiotics.  She will be discharged on Coumadin 2.5 mg daily.  Once she is done with the antibiotics check her INR Mondays and Thursdays till the INR is stable thereafter we can change it to once a week.   #4 hypothyroidism on Synthroid   #5 metastatic breast cancer follow up with  oncology Dr. Jana Hakim   #6 history of hyperparathyroidism followed by endocrinology   #7 severe deconditioning and generalized weakness PT consulted recommending SNF.   #8 lumbar compression fracture -MRI lumbar spine acute to subacute compression fracture involving the superior endplate of L4 and L5.  Chronic T12 compression fracture.  Will refer her to interventional radiology upon discharge.   #9 anemia hemoglobin 10.4 from 12.0.  Possible hemodilution as she was getting IV fluids for dehydration.  No evidence of active bleeding or gross bleeding noted.  FOBT negative.  Hemoglobin remained stable.   #10 hypokalemia repleted.   #11 goals of care patient is full code COVID test negative.    Estimated body mass index is 40 kg/m as calculated from the following:   Height as of this encounter: '5\' 4"'$  (1.626 m).   Weight as of this encounter: 105.7 kg.  Discharge Instructions  Discharge Instructions     Diet - low sodium heart healthy   Complete by: As directed    Increase activity slowly   Complete by:  As directed       Allergies as of 01/12/2021       Reactions   Anesthetics, Amide Hypertension   Benadryl [diphenhydramine Hcl] Other (See Comments)   Dizziness   Carbocaine [mepivacaine Hcl] Hypertension   Codeine Other (See Comments)   Dizziness   Epinephrine Hypertension   Sulfa Antibiotics Other (See Comments)   dizziness   Diphenhydramine    Latex Other (See Comments)    Blisters in mouth   Penicillins Rash   Has patient had a PCN reaction causing immediate rash, facial/tongue/throat swelling, SOB or lightheadedness with hypotension: Unknown Has patient had a PCN reaction causing severe rash involving mucus membranes or skin necrosis: Unknown Has patient had a PCN reaction that required hospitalization: Unknown Has patient had a PCN reaction occurring within the last 10 years: Unknown If all of the above answers are "NO", then may proceed with Cephalosporin use.   Tramadol Other (See Comments)   Sedation.         Medication List     TAKE these medications    B-D TB SYRINGE 1CC/27GX1/2" 27G X 1/2" 1 ML Misc Generic drug: TUBERCULIN SYR 1CC/27GX1/2" USE TO DRAW UP 1 ML LIDOCAINE AND INJECT INTO CEFTRIAXONE VIAL FOR DILUTION. CHANGE TO 25G NEEDLE FOR ADMINISTRATION   cefdinir 300 MG capsule Commonly known as: OMNICEF Take 1 capsule (300 mg total) by mouth 2 (two) times daily.   clotrimazole-betamethasone lotion Commonly known as: LOTRISONE Apply topically 2 (two) times daily.   HYDROcodone-acetaminophen 5-325 MG tablet Commonly known as: NORCO/VICODIN Take 1 tablet by mouth every 4 (four) hours as needed for up to 7 days for moderate pain. What changed: when to take this   ketoconazole 2 % cream Commonly known as: NIZORAL Apply 1 application topically daily.   NEEDLE (DISP) 25 G 25G X 1-1/2" Misc Please use to administer rocephin injection   Synthroid 125 MCG tablet Generic drug: levothyroxine TAKE 1 TABLET BY MOUTH EVERY DAY BEFORE BREAKFAST What changed: See the new instructions.   Syringe (Disposable) 5 ML Misc Please use to inject rocephin injection   vitamin C 250 MG tablet Commonly known as: ASCORBIC ACID Take 250 mg by mouth 3 (three) times daily.   Vitamin D3 125 MCG (5000 UT) Tabs Take 5,000 Units by mouth daily.   warfarin 2.5 MG tablet Commonly known as: COUMADIN Take as directed. If you are unsure how to take  this medication, talk to your nurse or doctor. Original instructions: Take 1 tablet (2.5 mg total) by mouth one time only at 4 PM. What changed:  medication strength how much to take how to take this when to take this additional instructions        Contact information for follow-up providers     [name], MD Follow up.   Specialty: Family Medicine Contact information: [place] [address] [phone]         [facility] Follow up.   Contact information: [address] [address]        Corrie Mckusick, DO Follow up.   Specialties: Interventional Radiology, Radiology Why: acute lumbar vertebral fracture Contact information: [address] [name] [address] [phone]              Contact information for after-discharge care     Destination     [facility] Preferred SNF .   Service: Skilled Nursing Contact information: [phone] [address]  [place] [phone]                    Allergies  Allergen Reactions   Anesthetics, Amide Hypertension   Benadryl [Diphenhydramine Hcl] Other (See Comments)    Dizziness   Carbocaine [Mepivacaine Hcl] Hypertension   Codeine Other (See Comments)    Dizziness   Epinephrine Hypertension   Sulfa Antibiotics Other (See Comments)    dizziness   Diphenhydramine    Latex Other (See Comments)    Blisters in mouth   Penicillins Rash    Has patient had a PCN reaction causing immediate rash, facial/tongue/throat swelling, SOB or lightheadedness with hypotension: Unknown Has patient had a PCN reaction causing severe rash involving mucus membranes or skin necrosis: Unknown Has patient had a PCN reaction that required hospitalization: Unknown Has patient had a PCN reaction occurring within the last 10 years: Unknown If all of the above answers are "NO", then may proceed with  Cephalosporin use.    Tramadol Other (See Comments)    Sedation.     Consultations: urology   Procedures/Studies: DG Chest 1 View  Result Date: 01/07/2021 CLINICAL DATA:  Shortness of breath. EXAM: CHEST  1 VIEW COMPARISON:  January 04, 2021. FINDINGS: The heart size and mediastinal contours are within normal limits. Both lungs are clear. The visualized skeletal structures are unremarkable. IMPRESSION: No active disease. Electronically Signed   By: Marijo Conception M.D.   On: 01/07/2021 14:46   DG Chest 2 View  Result Date: 01/04/2021 CLINICAL DATA:  Pain, mechanical fall EXAM: CHEST - 2 VIEW COMPARISON:  01/16/2017 FINDINGS: The heart size and mediastinal contours are within normal limits. Both lungs are clear. The visualized skeletal structures are unremarkable. IMPRESSION: No active cardiopulmonary disease. Electronically Signed   By: Donavan Foil M.D.   On: 01/04/2021 23:31   DG Pelvis 1-2 Views  Result Date: 01/04/2021 CLINICAL DATA:  Pain after fall EXAM: PELVIS - 1-2 VIEW COMPARISON:  06/04/2020, 02/01/2017 FINDINGS: Left hip replacement with intact hardware and normal alignment. No definite acute displaced fracture is seen. Pubic symphysis and rami appear intact. Mild periprosthetic lucency at the left trochanter. IMPRESSION: Left hip replacement without acute osseous abnormality Electronically Signed   By: Donavan Foil M.D.   On: 01/04/2021 23:33   CT Head Wo Contrast  Result Date: 01/04/2021 CLINICAL DATA:  [place] out of wheelchair EXAM: CT HEAD WITHOUT CONTRAST CT CERVICAL SPINE WITHOUT CONTRAST TECHNIQUE: Multidetector CT imaging of the head and cervical spine was performed following the standard protocol without intravenous contrast. Multiplanar CT image reconstructions of the cervical spine were also generated. COMPARISON:  CT brain and cervical spine 09/25/2015 . FINDINGS: CT HEAD FINDINGS Brain: no acute territorial infarction, hemorrhage, or intracranial mass. Interval  encephalomalacia at the right frontal lobe. Mild atrophy and chronic small vessel ischemic changes of the white matter Vascular: No hyperdense vessels.  Carotid vascular calcification Skull: Normal. Negative for fracture or focal lesion. Sinuses/Orbits: No acute finding. Other: None CT CERVICAL SPINE FINDINGS Alignment: No subluxation.  Facet alignment within normal limits. Skull base and vertebrae: No acute fracture. No primary bone lesion or focal pathologic process. Incomplete fusion left posterior arch of C1 Soft tissues and spinal canal: No prevertebral fluid or swelling. No visible canal hematoma. Disc levels: Moderate severe degenerative changes C4-C5 and C5-C6. Facet degenerative changes at multiple levels with foraminal narrowing. Upper chest: Negative. Other: None IMPRESSION: 1. No CT evidence for acute intracranial abnormality. Interval right  frontal lobe encephalomalacia. Mild atrophy and chronic small vessel ischemic changes of the white matter 2. Degenerative changes of the cervical spine. No acute osseous abnormality Electronically Signed   By: Donavan Foil M.D.   On: 01/04/2021 23:24   CT Cervical Spine Wo Contrast  Result Date: 01/04/2021 CLINICAL DATA:  [place] out of wheelchair EXAM: CT HEAD WITHOUT CONTRAST CT CERVICAL SPINE WITHOUT CONTRAST TECHNIQUE: Multidetector CT imaging of the head and cervical spine was performed following the standard protocol without intravenous contrast. Multiplanar CT image reconstructions of the cervical spine were also generated. COMPARISON:  CT brain and cervical spine 09/25/2015 . FINDINGS: CT HEAD FINDINGS Brain: no acute territorial infarction, hemorrhage, or intracranial mass. Interval encephalomalacia at the right frontal lobe. Mild atrophy and chronic small vessel ischemic changes of the white matter Vascular: No hyperdense vessels.  Carotid vascular calcification Skull: Normal. Negative for fracture or focal lesion. Sinuses/Orbits: No acute finding. Other:  None CT CERVICAL SPINE FINDINGS Alignment: No subluxation.  Facet alignment within normal limits. Skull base and vertebrae: No acute fracture. No primary bone lesion or focal pathologic process. Incomplete fusion left posterior arch of C1 Soft tissues and spinal canal: No prevertebral fluid or swelling. No visible canal hematoma. Disc levels: Moderate severe degenerative changes C4-C5 and C5-C6. Facet degenerative changes at multiple levels with foraminal narrowing. Upper chest: Negative. Other: None IMPRESSION: 1. No CT evidence for acute intracranial abnormality. Interval right frontal lobe encephalomalacia. Mild atrophy and chronic small vessel ischemic changes of the white matter 2. Degenerative changes of the cervical spine. No acute osseous abnormality Electronically Signed   By: Donavan Foil M.D.   On: 01/04/2021 23:24   MR LUMBAR SPINE WO CONTRAST  Result Date: 01/07/2021 CLINICAL DATA:  Initial evaluation for lower back pain. EXAM: MRI LUMBAR SPINE WITHOUT CONTRAST TECHNIQUE: Multiplanar, multisequence MR imaging of the lumbar spine was performed. No intravenous contrast was administered. COMPARISON:  CT from 01/05/2021. FINDINGS: Segmentation: Standard. Lowest well-formed disc space labeled the L5-S1 level. Alignment: Mild scoliosis. Trace 2 mm anterolisthesis of L3 on L4. Straightening of the normal lumbar lordosis elsewhere. Vertebrae: Compression deformity involving the T12 vertebral body with up to 50% central height loss and trace bony retropulsion is chronic in appearance. There is an acute to subacute compression fracture involving the superior endplate of L4 with mild 10% height loss without bony retropulsion. Marrow edema extends into the right L4 pedicle. Additional acute to subacute compression fracture seen involving the superior endplate of L5 with no more than mild 10% height loss without bony retropulsion. These are benign/mechanical in appearance. Underlying bone marrow signal  intensity mildly heterogeneous but within normal limits. No worrisome osseous lesions. No other abnormal marrow edema. Conus medullaris and cauda equina: Conus extends to the T12 level. Conus and cauda equina appear normal. Paraspinal and other soft tissues: Small volume edema/free fluid noted within the presacral space, nonspecific. Paraspinous soft tissues demonstrate no other acute finding. Visualized visceral structures grossly unremarkable. Disc levels: T12-L1: Minimal disc bulge with bony retropulsion related to the chronic T12 compression fracture. Probable superimposed left extraforaminal disc protrusion (series 9, image 9). No significant spinal stenosis. Foramina remain patent. L1-2: Degenerative intervertebral disc space narrowing with diffuse disc bulge. Associated reactive endplate spurring, greater on the right. There is a superimposed right foraminal to extraforaminal disc protrusion contacting the exiting right L1 nerve root (series 9, image 15). Additional tiny left subarticular disc protrusion minimally indents the left ventral thecal sac (series 8, image 15). Mild facet  hypertrophy. No significant spinal stenosis. Mild right L1 foraminal narrowing. Left neural foramina remains patent. L2-3: Disc bulge with reactive endplate spurring. Moderate bilateral facet hypertrophy. Resultant mild canal with bilateral lateral recess stenosis. Mild bilateral L2 foraminal narrowing. L3-4: Trace anterolisthesis. Mild disc bulge, eccentric to the left. Superimposed shallow broad-based left foraminal to extraforaminal disc protrusion contacts the exiting left L3 nerve root (series 9, image 27). Moderate facet hypertrophy. Resultant mild canal with bilateral lateral recess stenosis. Mild left L3 foraminal narrowing. Right neural foramen remains patent. L4-5: Disc desiccation with mild disc bulge. Superimposed left subarticular disc protrusion with slight inferior migration (series 8, image 34). Moderate facet and  ligament flavum hypertrophy. Associated small joint effusions. Resultant severe canal with severe left worse than right lateral recess stenosis. Mild bilateral L4 foraminal narrowing. L5-S1: Disc desiccation. Superimposed shallow left extraforaminal disc protrusion closely approximates the exiting left L5 nerve root (series 9, image 39). Moderate facet hypertrophy. No significant spinal stenosis. Foramina remain patent. IMPRESSION: 1. Acute to subacute compression fractures involving the superior endplates of L4 and L5. No more than mild height loss without bony retropulsion. 2. Additional chronic T12 compression fracture. 3. Multifactorial degenerative changes at L4-5 with resultant severe canal with left worse than right lateral recess stenosis. 4. Foraminal to extraforaminal disc protrusions on the right at L1-2, on the left at L3-4, and on the left at L5-S1, potentially affecting the right L1, left L3, and left L5 nerve roots respectively. 5. Small volume edema/free fluid within the presacral space, nonspecific, and could be reactive in nature. Electronically Signed   By: Jeannine Boga M.D.   On: 01/07/2021 00:18   DG C-Arm 1-60 Min-No Report  Result Date: 01/07/2021 Fluoroscopy was utilized by the requesting physician.  No radiographic interpretation.   CT RENAL STONE STUDY  Result Date: 01/05/2021 CLINICAL DATA:  80 year old female status post fall yesterday. Weakness. Acute renal failure. EXAM: CT ABDOMEN AND PELVIS WITHOUT CONTRAST TECHNIQUE: Multidetector CT imaging of the abdomen and pelvis was performed following the standard protocol without IV contrast. COMPARISON:  CT Abdomen and Pelvis 01/15/2017.  Chest CT 03/18/2019. FINDINGS: Lower chest: Respiratory motion and lower lung volumes. Chronic benign calcified granuloma in the right costophrenic angle with associated mild atelectasis or scarring today. No pericardial or pleural effusion. Hepatobiliary: Negative noncontrast liver and  gallbladder. Pancreas: Mild pancreatic atrophy. Spleen: Negative; small chronic calcified granulomas. Adrenals/Urinary Tract: Adrenal glands remain within normal limits. Negative noncontrast left kidney and left ureter. Moderate to severe right hydronephrosis with right nephromegaly and acute perinephric stranding. Bulky 11 mm intrarenal calculus. Obstructing 6 mm calculus in the proximal right ureter just distal to the ureteropelvic junction (coronal image 82). Distal to that stone the right ureter is decompressed. There is continued periureteral stranding toward the pelvis. Both distal ureters appear decompressed and normal. Diminutive urinary bladder. Incidental pelvic phleboliths. Stomach/Bowel: Redundant large bowel with diverticulosis. No large bowel inflammation. Normal appendix on series 2, image 64. Negative terminal ileum. No dilated small bowel. Moderate gastric hiatal hernia. Otherwise negative stomach and duodenum. No free air. No abdominal free fluid. Vascular/Lymphatic: Aortoiliac calcified atherosclerosis. Normal caliber abdominal aorta. Vascular patency is not evaluated in the absence of IV contrast. No lymphadenopathy. Reproductive: Negative noncontrast appearance. Other: Mild presacral stranding is new.  No pelvic free fluid. Musculoskeletal: Osteopenia. No destructive osseous lesion identified. However, T12 compression fracture has progressed since 2020, now moderate to severe (series 5, image 105. Furthermore, mild compression of the T11 vertebral body is new since  2020. Mild compression of the L4 superior endplate is new since [id number]. No significant retropulsion at these levels. Left hip arthroplasty is new since 2018. IMPRESSION: 1. Acute obstructive uropathy on the right with a 6 mm proximal right ureteral calculus just distal to the UPJ. Superimposed bulky 11 mm right intrarenal calculus. 2. Osteopenia with new and progressed lower thoracic and lumbar compression fractures since 2020: T11,  T12, and L4. If specific therapy such as vertebroplasty is desired, Lumbar MRI or Nuclear Medicine Whole-body Bone Scan would best determine acuity. 3. No other acute or inflammatory process identified in the noncontrast abdomen or pelvis. 4. Aortic Atherosclerosis (ICD10-I70.0). Electronically Signed   By: Genevie Ann M.D.   On: 01/05/2021 10:30   (Echo, Carotid, EGD, Colonoscopy, ERCP)    Subjective:  She is resting in bed no new events or acute events overnight.  She is very hard of hearing. Urine output clear Discharge Exam: Vitals:   01/11/21 2351 01/12/21 0443  BP: (!) 148/70 (!) 160/74  Pulse: 80 76  Resp:  20  Temp:  98.5 F (36.9 C)  SpO2:  94%   Vitals:   01/11/21 1301 01/11/21 2028 01/11/21 2351 01/12/21 0443  BP: (!) 151/61 (!) 180/70 (!) 148/70 (!) 160/74  Pulse: 85 83 80 76  Resp: '17 18  20  '$ Temp: 98.5 F (36.9 C) 98.3 F (36.8 C)  98.5 F (36.9 C)  TempSrc: Oral     SpO2: 96% 96%  94%  Weight:      Height:        General: Pt is alert, awake, not in acute distress Cardiovascular: RRR, S1/S2 +, no rubs, no gallops Respiratory: CTA bilaterally, no wheezing, no rhonchi Abdominal: Soft, NT, ND, bowel sounds + Extremities: no edema, no cyanosis    The results of significant diagnostics from this hospitalization (including imaging, microbiology, ancillary and laboratory) are listed below for reference.     Microbiology: Recent Results (from the past 240 hour(s))  Resp Panel by RT-PCR (Flu A&B, Covid) Nasopharyngeal Swab     Status: None   Collection Time: 01/04/21 11:30 PM   Specimen: Nasopharyngeal Swab; Nasopharyngeal(NP) swabs in vial transport medium  Result Value Ref Range Status   SARS Coronavirus 2 by RT PCR NEGATIVE NEGATIVE Final    Comment: (NOTE) SARS-CoV-2 target nucleic acids are NOT DETECTED.  The SARS-CoV-2 RNA is generally detectable in upper respiratory specimens during the acute phase of infection. The lowest concentration of SARS-CoV-2  viral copies this assay can detect is 138 copies/mL. A negative result does not preclude SARS-Cov-2 infection and should not be used as the sole basis for treatment or other patient management decisions. A negative result may occur with  improper specimen collection/handling, submission of specimen other than nasopharyngeal swab, presence of viral mutation(s) within the areas targeted by this assay, and inadequate number of viral copies(<138 copies/mL). A negative result must be combined with clinical observations, patient history, and epidemiological information. The expected result is Negative.  Fact Sheet for Patients:  EntrepreneurPulse.com.au  Fact Sheet for Healthcare Providers:  IncredibleEmployment.be  This test is no t yet approved or cleared by the [place] FDA and  has been authorized for detection and/or diagnosis of SARS-CoV-2 by FDA under an Emergency Use Authorization (EUA). This EUA will remain  in effect (meaning this test can be used) for the duration of the COVID-19 declaration under Section 564(b)(1) of the Act, 21 U.S.C.section 360bbb-3(b)(1), unless the authorization is terminated  or revoked sooner.  Influenza A by PCR NEGATIVE NEGATIVE Final   Influenza B by PCR NEGATIVE NEGATIVE Final    Comment: (NOTE) The Xpert Xpress SARS-CoV-2/FLU/RSV plus assay is intended as an aid in the diagnosis of influenza from Nasopharyngeal swab specimens and should not be used as a sole basis for treatment. Nasal washings and aspirates are unacceptable for Xpert Xpress SARS-CoV-2/FLU/RSV testing.  Fact Sheet for Patients: EntrepreneurPulse.com.au  Fact Sheet for Healthcare Providers: IncredibleEmployment.be  This test is not yet approved or cleared by the [place] FDA and has been authorized for detection and/or diagnosis of SARS-CoV-2 by FDA under an Emergency Use Authorization  (EUA). This EUA will remain in effect (meaning this test can be used) for the duration of the COVID-19 declaration under Section 564(b)(1) of the Act, 21 U.S.C. section 360bbb-3(b)(1), unless the authorization is terminated or revoked.  Performed at [facility], [place] [address], [place], [place] [address]   MRSA Next Gen by PCR, Nasal     Status: None   Collection Time: 01/07/21  2:31 AM   Specimen: Nasal Mucosa; Nasal Swab  Result Value Ref Range Status   MRSA by PCR Next Gen NOT DETECTED NOT DETECTED Final    Comment: (NOTE) The GeneXpert MRSA Assay (FDA approved for NASAL specimens only), is one component of a comprehensive MRSA colonization surveillance program. It is not intended to diagnose MRSA infection nor to guide or monitor treatment for MRSA infections. Test performance is not FDA approved in patients less than 26 years old. Performed at [facility], [place] [address] [place], [place] [address]   Culture, blood (routine x 2)     Status: None (Preliminary result)   Collection Time: 01/07/21  3:15 AM   Specimen: BLOOD  Result Value Ref Range Status   Specimen Description   Final    BLOOD BLOOD LEFT HAND Performed at [facility] [address], [place], [place] [address]    Special Requests   Final    BOTTLES DRAWN AEROBIC ONLY Blood Culture adequate volume Performed at [facility] [address] [place], [place] [address]    Culture   Final    NO GROWTH 4 DAYS Performed at [facility], [place] [address] [place], [place] [address]    Report Status PENDING  Incomplete  Culture, blood (routine x 2)     Status: None (Preliminary result)   Collection Time: 01/07/21  3:15 AM   Specimen: BLOOD  Result Value Ref Range Status   Specimen Description   Final    BLOOD LEFT ANTECUBITAL Performed at [facility] [address], [place], [place] [address]    Special  Requests   Final    BOTTLES DRAWN AEROBIC ONLY Blood Culture adequate volume Performed at [facility] [address] [place], [place] [address]    Culture   Final    NO GROWTH 4 DAYS Performed at [facility], [place] [address], [place], [place] [address]    Report Status PENDING  Incomplete  SARS CORONAVIRUS 2 (TAT 6-24 HRS) Nasopharyngeal Nasopharyngeal Swab     Status: None   Collection Time: 01/11/21  3:11 PM   Specimen: Nasopharyngeal Swab  Result Value Ref Range Status   SARS Coronavirus 2 NEGATIVE NEGATIVE Final    Comment: (NOTE) SARS-CoV-2 target nucleic acids are NOT DETECTED.  The SARS-CoV-2 RNA is generally detectable in upper and lower respiratory specimens during the acute phase of  infection. Negative results do not preclude SARS-CoV-2 infection, do not rule out co-infections with other pathogens, and should not be used as the sole basis for treatment or other patient management decisions. Negative results must be combined with clinical observations, patient history, and epidemiological information. The expected result is Negative.  Fact Sheet for Patients: SugarRoll.be  Fact Sheet for Healthcare Providers: https://www.woods-Burnell Hurta.com/  This test is not yet approved or cleared by the [place] FDA and  has been authorized for detection and/or diagnosis of SARS-CoV-2 by FDA under an Emergency Use Authorization (EUA). This EUA will remain  in effect (meaning this test can be used) for the duration of the COVID-19 declaration under Se ction 564(b)(1) of the Act, 21 U.S.C. section 360bbb-3(b)(1), unless the authorization is terminated or revoked sooner.  Performed at [facility], [place] [address] [place], [place] [address]      Labs: BNP (last 3 results) No results for input(s): BNP in the last 8760 hours. Basic Metabolic Panel: Recent Labs  Lab 01/06/21 0352 01/07/21 0317  01/08/21 0517 01/09/21 0249 01/10/21 0325  NA 138 138 139 135 137  K 3.9 3.7 4.1 3.5 3.3*  CL 110 108 107 105 105  CO2 18* 20* '22 24 25  '$ GLUCOSE 121* 151* 150* 132* 109*  BUN 32* 34* 38* 45* 33*  CREATININE 1.35* 1.51* 1.28* 1.09* 0.85  CALCIUM 10.3 10.5* 10.3 10.3 10.1  MG 2.1 2.0 2.2 2.1 1.9   Liver Function Tests: No results for input(s): AST, ALT, ALKPHOS, BILITOT, PROT, ALBUMIN in the last 168 hours. No results for input(s): LIPASE, AMYLASE in the last 168 hours. No results for input(s): AMMONIA in the last 168 hours. CBC: Recent Labs  Lab 01/06/21 0352 01/07/21 0317 01/08/21 0517 01/09/21 0249 01/10/21 0325  WBC 12.1* 11.4* 15.7* 15.5* 10.8*  NEUTROABS 10.1* 10.1* 13.7* 12.7* 8.0*  HGB 12.9 12.0 10.4* 10.6* 10.6*  HCT 41.5 37.9 33.7* 33.7* 33.5*  MCV 91.6 90.0 90.6 90.1 88.4  PLT 164 127* 96* 112* 131*   Cardiac Enzymes: No results for input(s): CKTOTAL, CKMB, CKMBINDEX, TROPONINI in the last 168 hours. BNP: Invalid input(s): POCBNP CBG: Recent Labs  Lab 01/09/21 0800  GLUCAP 104*   D-Dimer No results for input(s): DDIMER in the last 72 hours. Hgb A1c No results for input(s): HGBA1C in the last 72 hours. Lipid Profile No results for input(s): CHOL, HDL, LDLCALC, TRIG, CHOLHDL, LDLDIRECT in the last 72 hours. Thyroid function studies No results for input(s): TSH, T4TOTAL, T3FREE, THYROIDAB in the last 72 hours.  Invalid input(s): FREET3 Anemia work up No results for input(s): VITAMINB12, FOLATE, FERRITIN, TIBC, IRON, RETICCTPCT in the last 72 hours. Urinalysis    Component Value Date/Time   COLORURINE YELLOW 01/05/2021 0030   APPEARANCEUR HAZY (A) 01/05/2021 0030   LABSPEC 1.024 01/05/2021 0030   LABSPEC 1.010 09/22/2016 1553   PHURINE 5.0 01/05/2021 0030   GLUCOSEU NEGATIVE 01/05/2021 0030   GLUCOSEU Negative 09/22/2016 1553   HGBUR NEGATIVE 01/05/2021 0030   BILIRUBINUR NEGATIVE 01/05/2021 0030   BILIRUBINUR neg 12/16/2020 1116   BILIRUBINUR  Negative 09/22/2016 1553   KETONESUR 5 (A) 01/05/2021 0030   PROTEINUR 100 (A) 01/05/2021 0030   UROBILINOGEN negative (A) 12/16/2020 1116   UROBILINOGEN 0.2 11/15/2016 1358   UROBILINOGEN 0.2 09/22/2016 1553   NITRITE NEGATIVE 01/05/2021 0030   LEUKOCYTESUR LARGE (A) 01/05/2021 0030   LEUKOCYTESUR Negative 09/22/2016 1553   Sepsis Labs Invalid input(s): PROCALCITONIN,  WBC,  LACTICIDVEN Microbiology Recent Results (  from the past 240 hour(s))  Resp Panel by RT-PCR (Flu A&B, Covid) Nasopharyngeal Swab     Status: None   Collection Time: 01/04/21 11:30 PM   Specimen: Nasopharyngeal Swab; Nasopharyngeal(NP) swabs in vial transport medium  Result Value Ref Range Status   SARS Coronavirus 2 by RT PCR NEGATIVE NEGATIVE Final    Comment: (NOTE) SARS-CoV-2 target nucleic acids are NOT DETECTED.  The SARS-CoV-2 RNA is generally detectable in upper respiratory specimens during the acute phase of infection. The lowest concentration of SARS-CoV-2 viral copies this assay can detect is 138 copies/mL. A negative result does not preclude SARS-Cov-2 infection and should not be used as the sole basis for treatment or other patient management decisions. A negative result may occur with  improper specimen collection/handling, submission of specimen other than nasopharyngeal swab, presence of viral mutation(s) within the areas targeted by this assay, and inadequate number of viral copies(<138 copies/mL). A negative result must be combined with clinical observations, patient history, and epidemiological information. The expected result is Negative.  Fact Sheet for Patients:  EntrepreneurPulse.com.au  Fact Sheet for Healthcare Providers:  IncredibleEmployment.be  This test is no t yet approved or cleared by the [place] FDA and  has been authorized for detection and/or diagnosis of SARS-CoV-2 by FDA under an Emergency Use Authorization (EUA). This EUA will  remain  in effect (meaning this test can be used) for the duration of the COVID-19 declaration under Section 564(b)(1) of the Act, 21 U.S.C.section 360bbb-3(b)(1), unless the authorization is terminated  or revoked sooner.       Influenza A by PCR NEGATIVE NEGATIVE Final   Influenza B by PCR NEGATIVE NEGATIVE Final    Comment: (NOTE) The Xpert Xpress SARS-CoV-2/FLU/RSV plus assay is intended as an aid in the diagnosis of influenza from Nasopharyngeal swab specimens and should not be used as a sole basis for treatment. Nasal washings and aspirates are unacceptable for Xpert Xpress SARS-CoV-2/FLU/RSV testing.  Fact Sheet for Patients: EntrepreneurPulse.com.au  Fact Sheet for Healthcare Providers: IncredibleEmployment.be  This test is not yet approved or cleared by the [place] FDA and has been authorized for detection and/or diagnosis of SARS-CoV-2 by FDA under an Emergency Use Authorization (EUA). This EUA will remain in effect (meaning this test can be used) for the duration of the COVID-19 declaration under Section 564(b)(1) of the Act, 21 U.S.C. section 360bbb-3(b)(1), unless the authorization is terminated or revoked.  Performed at [facility], [place] [address], [place], [place] [address]   MRSA Next Gen by PCR, Nasal     Status: None   Collection Time: 01/07/21  2:31 AM   Specimen: Nasal Mucosa; Nasal Swab  Result Value Ref Range Status   MRSA by PCR Next Gen NOT DETECTED NOT DETECTED Final    Comment: (NOTE) The GeneXpert MRSA Assay (FDA approved for NASAL specimens only), is one component of a comprehensive MRSA colonization surveillance program. It is not intended to diagnose MRSA infection nor to guide or monitor treatment for MRSA infections. Test performance is not FDA approved in patients less than 51 years old. Performed at [facility], [place] [address], [place], [place] [address]    Culture, blood (routine x 2)     Status: None (Preliminary result)   Collection Time: 01/07/21  3:15 AM   Specimen: BLOOD  Result Value Ref Range Status   Specimen Description   Final    BLOOD BLOOD LEFT HAND Performed at [facility]  [address], [place], [place] [address]    Special Requests   Final    BOTTLES DRAWN AEROBIC ONLY Blood Culture adequate volume Performed at [facility] [address], [place], [place] [address]    Culture   Final    NO GROWTH 4 DAYS Performed at [facility], [place] [address] [place], [place] [address]    Report Status PENDING  Incomplete  Culture, blood (routine x 2)     Status: None (Preliminary result)   Collection Time: 01/07/21  3:15 AM   Specimen: BLOOD  Result Value Ref Range Status   Specimen Description   Final    BLOOD LEFT ANTECUBITAL Performed at [facility] [address], [place], [place] [address]    Special Requests   Final    BOTTLES DRAWN AEROBIC ONLY Blood Culture adequate volume Performed at [facility] [address], [place], [place] [address]    Culture   Final    NO GROWTH 4 DAYS Performed at [facility], [place] [address] [place], [place] [address]    Report Status PENDING  Incomplete  SARS CORONAVIRUS 2 (TAT 6-24 HRS) Nasopharyngeal Nasopharyngeal Swab     Status: None   Collection Time: 01/11/21  3:11 PM   Specimen: Nasopharyngeal Swab  Result Value Ref Range Status   SARS Coronavirus 2 NEGATIVE NEGATIVE Final    Comment: (NOTE) SARS-CoV-2 target nucleic acids are NOT DETECTED.  The SARS-CoV-2 RNA is generally detectable in upper and lower respiratory specimens during the acute phase of infection. Negative results do not preclude SARS-CoV-2 infection, do not rule out co-infections with other pathogens, and should not be used as the sole basis for treatment or other patient management decisions. Negative results  must be combined with clinical observations, patient history, and epidemiological information. The expected result is Negative.  Fact Sheet for Patients: SugarRoll.be  Fact Sheet for Healthcare Providers: https://www.woods-Amsi Grimley.com/  This test is not yet approved or cleared by the [place] FDA and  has been authorized for detection and/or diagnosis of SARS-CoV-2 by FDA under an Emergency Use Authorization (EUA). This EUA will remain  in effect (meaning this test can be used) for the duration of the COVID-19 declaration under Se ction 564(b)(1) of the Act, 21 U.S.C. section 360bbb-3(b)(1), unless the authorization is terminated or revoked sooner.  Performed at [facility], [place] [address] [place], [place] [address]      Time coordinating discharge: 39 minutes  SIGNED:   Georgette Shell, MD  [facility] 01/12/2021, 10:22 AM

## 2021-01-13 ENCOUNTER — Telehealth: Payer: Self-pay | Admitting: *Deleted

## 2021-01-13 ENCOUNTER — Telehealth: Payer: Self-pay

## 2021-01-13 DIAGNOSIS — S0093XA Contusion of unspecified part of head, initial encounter: Secondary | ICD-10-CM | POA: Diagnosis not present

## 2021-01-13 DIAGNOSIS — N179 Acute kidney failure, unspecified: Secondary | ICD-10-CM | POA: Diagnosis not present

## 2021-01-13 DIAGNOSIS — C50919 Malignant neoplasm of unspecified site of unspecified female breast: Secondary | ICD-10-CM | POA: Diagnosis not present

## 2021-01-13 DIAGNOSIS — N390 Urinary tract infection, site not specified: Secondary | ICD-10-CM | POA: Diagnosis not present

## 2021-01-13 DIAGNOSIS — W19XXXA Unspecified fall, initial encounter: Secondary | ICD-10-CM | POA: Diagnosis not present

## 2021-01-13 NOTE — Telephone Encounter (Signed)
Nurse Assessment Nurse: Markus Daft, RN, Sherre Poot Date/Time (Eastern Time): 01/13/2021 5:59:02 AM Confirm and document reason for call. If symptomatic, describe symptoms. ---Caller states she was sent to rehab and they don't have her pain medication, and she is unable to move. She is in a lot of pain all over and back. She was in hospital til yesterday. - States that finally they gave her pain med which has helped within an hr for her back. Does the patient have any new or worsening symptoms? ---No Please document clinical information provided and list any resource used. ---She was able to get her pain meds. Disp. Time Eilene Ghazi Time) Disposition Final User 01/13/2021 12:01:40 AM Attempt made - message left Mancel Bale RN, [place] 01/13/2021 12:17:10 AM FINAL ATTEMPT MADE - message left Lennette Bihari, [place] 01/13/2021 12:17:17 AM Send to RN Final Attempt Levin Bacon, RN, Traci 01/13/2021 6:03:12 AM Clinical Call Yes Markus Daft, RN, [place]

## 2021-01-13 NOTE — Telephone Encounter (Signed)
VM left x 3 by pt - the first two was stating concern about pain medication not being given per new status at [facility] facility post recent hospitalization. 3rd message was stating need to cancel upcoming appointment

## 2021-01-14 ENCOUNTER — Other Ambulatory Visit: Payer: Self-pay | Admitting: Internal Medicine

## 2021-01-14 ENCOUNTER — Encounter (HOSPITAL_COMMUNITY): Payer: Self-pay | Admitting: Urology

## 2021-01-14 DIAGNOSIS — N390 Urinary tract infection, site not specified: Secondary | ICD-10-CM | POA: Diagnosis not present

## 2021-01-14 DIAGNOSIS — S32000A Wedge compression fracture of unspecified lumbar vertebra, initial encounter for closed fracture: Secondary | ICD-10-CM

## 2021-01-14 DIAGNOSIS — T148XXD Other injury of unspecified body region, subsequent encounter: Secondary | ICD-10-CM | POA: Diagnosis not present

## 2021-01-14 DIAGNOSIS — I82409 Acute embolism and thrombosis of unspecified deep veins of unspecified lower extremity: Secondary | ICD-10-CM | POA: Diagnosis not present

## 2021-01-14 DIAGNOSIS — E039 Hypothyroidism, unspecified: Secondary | ICD-10-CM | POA: Diagnosis not present

## 2021-01-14 DIAGNOSIS — C50919 Malignant neoplasm of unspecified site of unspecified female breast: Secondary | ICD-10-CM | POA: Diagnosis not present

## 2021-01-14 DIAGNOSIS — E213 Hyperparathyroidism, unspecified: Secondary | ICD-10-CM | POA: Diagnosis not present

## 2021-01-14 DIAGNOSIS — I10 Essential (primary) hypertension: Secondary | ICD-10-CM | POA: Diagnosis not present

## 2021-01-14 DIAGNOSIS — N179 Acute kidney failure, unspecified: Secondary | ICD-10-CM | POA: Diagnosis not present

## 2021-01-14 DIAGNOSIS — D649 Anemia, unspecified: Secondary | ICD-10-CM | POA: Diagnosis not present

## 2021-01-14 DIAGNOSIS — Z7901 Long term (current) use of anticoagulants: Secondary | ICD-10-CM | POA: Diagnosis not present

## 2021-01-14 DIAGNOSIS — M6281 Muscle weakness (generalized): Secondary | ICD-10-CM | POA: Diagnosis not present

## 2021-01-14 DIAGNOSIS — S22000A Wedge compression fracture of unspecified thoracic vertebra, initial encounter for closed fracture: Secondary | ICD-10-CM

## 2021-01-16 DIAGNOSIS — Z7901 Long term (current) use of anticoagulants: Secondary | ICD-10-CM | POA: Diagnosis not present

## 2021-01-16 DIAGNOSIS — M6281 Muscle weakness (generalized): Secondary | ICD-10-CM | POA: Diagnosis not present

## 2021-01-16 DIAGNOSIS — I82409 Acute embolism and thrombosis of unspecified deep veins of unspecified lower extremity: Secondary | ICD-10-CM | POA: Diagnosis not present

## 2021-01-16 DIAGNOSIS — K1370 Unspecified lesions of oral mucosa: Secondary | ICD-10-CM | POA: Diagnosis not present

## 2021-01-16 DIAGNOSIS — I10 Essential (primary) hypertension: Secondary | ICD-10-CM | POA: Diagnosis not present

## 2021-01-18 ENCOUNTER — Ambulatory Visit: Payer: Medicare Other | Admitting: Podiatry

## 2021-01-18 ENCOUNTER — Other Ambulatory Visit: Payer: Self-pay | Admitting: Urology

## 2021-01-18 DIAGNOSIS — I10 Essential (primary) hypertension: Secondary | ICD-10-CM | POA: Diagnosis not present

## 2021-01-18 DIAGNOSIS — Z7901 Long term (current) use of anticoagulants: Secondary | ICD-10-CM | POA: Diagnosis not present

## 2021-01-18 DIAGNOSIS — M6281 Muscle weakness (generalized): Secondary | ICD-10-CM | POA: Diagnosis not present

## 2021-01-18 DIAGNOSIS — I82409 Acute embolism and thrombosis of unspecified deep veins of unspecified lower extremity: Secondary | ICD-10-CM | POA: Diagnosis not present

## 2021-01-18 DIAGNOSIS — N390 Urinary tract infection, site not specified: Secondary | ICD-10-CM | POA: Diagnosis not present

## 2021-01-19 ENCOUNTER — Telehealth: Payer: Self-pay | Admitting: *Deleted

## 2021-01-19 ENCOUNTER — Other Ambulatory Visit: Payer: Self-pay | Admitting: Urology

## 2021-01-19 NOTE — Telephone Encounter (Signed)
This RN called and was able to speak with [facility] nurse, Nunzio Cory, at [facility]- she states they are able to transport pt on 01/21/2021 for scheduled visits.  Pt is able to stand and bear weight and pivot per transferring from bed to chair.

## 2021-01-20 DIAGNOSIS — I82409 Acute embolism and thrombosis of unspecified deep veins of unspecified lower extremity: Secondary | ICD-10-CM | POA: Diagnosis not present

## 2021-01-20 DIAGNOSIS — I10 Essential (primary) hypertension: Secondary | ICD-10-CM | POA: Diagnosis not present

## 2021-01-20 DIAGNOSIS — N202 Calculus of kidney with calculus of ureter: Secondary | ICD-10-CM | POA: Diagnosis not present

## 2021-01-20 DIAGNOSIS — M6281 Muscle weakness (generalized): Secondary | ICD-10-CM | POA: Diagnosis not present

## 2021-01-20 DIAGNOSIS — Z7901 Long term (current) use of anticoagulants: Secondary | ICD-10-CM | POA: Diagnosis not present

## 2021-01-20 DIAGNOSIS — N3000 Acute cystitis without hematuria: Secondary | ICD-10-CM | POA: Diagnosis not present

## 2021-01-21 ENCOUNTER — Inpatient Hospital Stay: Payer: Medicare Other

## 2021-01-21 ENCOUNTER — Inpatient Hospital Stay: Payer: Medicare Other | Attending: Oncology

## 2021-01-21 ENCOUNTER — Inpatient Hospital Stay (HOSPITAL_BASED_OUTPATIENT_CLINIC_OR_DEPARTMENT_OTHER): Payer: Medicare Other | Admitting: Oncology

## 2021-01-21 ENCOUNTER — Encounter: Payer: Self-pay | Admitting: *Deleted

## 2021-01-21 ENCOUNTER — Other Ambulatory Visit: Payer: Self-pay

## 2021-01-21 ENCOUNTER — Ambulatory Visit
Admission: RE | Admit: 2021-01-21 | Discharge: 2021-01-21 | Disposition: A | Discharge status: Home / Self Care | Payer: Medicare Other | Source: Ambulatory Visit | Attending: Internal Medicine | Admitting: Internal Medicine

## 2021-01-21 VITALS — BP 170/69 | HR 82 | Temp 98.1°F | Resp 18 | Ht 64.0 in | Wt 222.9 lb

## 2021-01-21 DIAGNOSIS — S32040A Wedge compression fracture of fourth lumbar vertebra, initial encounter for closed fracture: Secondary | ICD-10-CM | POA: Diagnosis not present

## 2021-01-21 DIAGNOSIS — C50411 Malignant neoplasm of upper-outer quadrant of right female breast: Secondary | ICD-10-CM | POA: Insufficient documentation

## 2021-01-21 DIAGNOSIS — Z170 Estrogen receptor positive status [ER+]: Secondary | ICD-10-CM | POA: Diagnosis not present

## 2021-01-21 DIAGNOSIS — S22080A Wedge compression fracture of T11-T12 vertebra, initial encounter for closed fracture: Secondary | ICD-10-CM | POA: Diagnosis not present

## 2021-01-21 DIAGNOSIS — C7951 Secondary malignant neoplasm of bone: Secondary | ICD-10-CM | POA: Diagnosis not present

## 2021-01-21 DIAGNOSIS — I82401 Acute embolism and thrombosis of unspecified deep veins of right lower extremity: Secondary | ICD-10-CM

## 2021-01-21 DIAGNOSIS — S32050A Wedge compression fracture of fifth lumbar vertebra, initial encounter for closed fracture: Secondary | ICD-10-CM | POA: Diagnosis not present

## 2021-01-21 DIAGNOSIS — S22000A Wedge compression fracture of unspecified thoracic vertebra, initial encounter for closed fracture: Secondary | ICD-10-CM

## 2021-01-21 DIAGNOSIS — Z86711 Personal history of pulmonary embolism: Secondary | ICD-10-CM | POA: Diagnosis not present

## 2021-01-21 DIAGNOSIS — S32000A Wedge compression fracture of unspecified lumbar vertebra, initial encounter for closed fracture: Secondary | ICD-10-CM

## 2021-01-21 DIAGNOSIS — M899 Disorder of bone, unspecified: Secondary | ICD-10-CM

## 2021-01-21 DIAGNOSIS — M84659P Pathological fracture in other disease, hip, unspecified, subsequent encounter for fracture with malunion: Secondary | ICD-10-CM

## 2021-01-21 HISTORY — PX: IR RADIOLOGIST EVAL & MGMT: IMG5224

## 2021-01-21 LAB — CBC WITH DIFFERENTIAL (CANCER CENTER ONLY)
Abs Immature Granulocytes: 0.02 10*3/uL (ref 0.00–0.07)
Basophils Absolute: 0.1 10*3/uL (ref 0.0–0.1)
Basophils Relative: 1 %
Eosinophils Absolute: 0.2 10*3/uL (ref 0.0–0.5)
Eosinophils Relative: 2 %
HCT: 36.4 % (ref 36.0–46.0)
Hemoglobin: 11.6 g/dL — ABNORMAL LOW (ref 12.0–15.0)
Immature Granulocytes: 0 %
Lymphocytes Relative: 19 %
Lymphs Abs: 1.3 10*3/uL (ref 0.7–4.0)
MCH: 28.6 pg (ref 26.0–34.0)
MCHC: 31.9 g/dL (ref 30.0–36.0)
MCV: 89.9 fL (ref 80.0–100.0)
Monocytes Absolute: 0.8 10*3/uL (ref 0.1–1.0)
Monocytes Relative: 12 %
Neutro Abs: 4.4 10*3/uL (ref 1.7–7.7)
Neutrophils Relative %: 66 %
Platelet Count: 279 10*3/uL (ref 150–400)
RBC: 4.05 MIL/uL (ref 3.87–5.11)
RDW: 16.3 % — ABNORMAL HIGH (ref 11.5–15.5)
WBC Count: 6.8 10*3/uL (ref 4.0–10.5)
nRBC: 0 % (ref 0.0–0.2)

## 2021-01-21 LAB — CMP (CANCER CENTER ONLY)
ALT: 20 U/L (ref 0–44)
AST: 14 U/L — ABNORMAL LOW (ref 15–41)
Albumin: 3.4 g/dL — ABNORMAL LOW (ref 3.5–5.0)
Alkaline Phosphatase: 156 U/L — ABNORMAL HIGH (ref 38–126)
Anion gap: 7 (ref 5–15)
BUN: 14 mg/dL (ref 8–23)
CO2: 27 mmol/L (ref 22–32)
Calcium: 10.7 mg/dL — ABNORMAL HIGH (ref 8.9–10.3)
Chloride: 106 mmol/L (ref 98–111)
Creatinine: 0.8 mg/dL (ref 0.44–1.00)
GFR, Estimated: >60 mL/min (ref >60)
Glucose, Bld: 112 mg/dL — ABNORMAL HIGH (ref 70–99)
Potassium: 4 mmol/L (ref 3.5–5.1)
Sodium: 140 mmol/L (ref 135–145)
Total Bilirubin: 0.5 mg/dL (ref 0.3–1.2)
Total Protein: 6.8 g/dL (ref 6.5–8.1)

## 2021-01-21 LAB — PROTIME-INR
INR: 1.3 — ABNORMAL HIGH (ref 0.8–1.2)
Prothrombin Time: 16.6 seconds — ABNORMAL HIGH (ref 11.4–15.2)

## 2021-01-21 MED ORDER — WARFARIN SODIUM 2.5 MG PO TABS
5.0000 mg | ORAL_TABLET | Freq: Once | ORAL | 0 refills | Status: DC
Start: 2021-01-21 — End: 2021-01-27

## 2021-01-21 MED ORDER — FULVESTRANT 250 MG/5ML IM SOLN
500.0000 mg | Freq: Once | INTRAMUSCULAR | Status: AC
  Administered 2021-01-21: 500 mg via INTRAMUSCULAR
  Filled 2021-01-21: qty 10

## 2021-01-21 NOTE — Patient Instructions (Signed)
Fulvestrant injection What is this medication? FULVESTRANT (ful VES trant) blocks the effects of estrogen. It is used to treat breast cancer. This medicine may be used for other purposes; ask your health care provider or pharmacist if you have questions. COMMON BRAND NAME(S): FASLODEX What should I tell my care team before I take this medication? They need to know if you have any of these conditions: bleeding disorders liver disease low blood counts, like low white cell, platelet, or red cell counts an unusual or allergic reaction to fulvestrant, other medicines, foods, dyes, or preservatives pregnant or trying to get pregnant breast-feeding How should I use this medication? This medicine is for injection into a muscle. It is usually given by a health care professional in a hospital or clinic setting. Talk to your pediatrician regarding the use of this medicine in children. Special care may be needed. Overdosage: If you think you have taken too much of this medicine contact a poison control center or emergency room at once. NOTE: This medicine is only for you. Do not share this medicine with others. What if I miss a dose? It is important not to miss your dose. Call your doctor or health care professional if you are unable to keep an appointment. What may interact with this medication? medicines that treat or prevent blood clots like warfarin, enoxaparin, dalteparin, apixaban, dabigatran, and rivaroxaban This list may not describe all possible interactions. Give your health care provider a list of all the medicines, herbs, non-prescription drugs, or dietary supplements you use. Also tell them if you smoke, drink alcohol, or use illegal drugs. Some items may interact with your medicine. What should I watch for while using this medication? Your condition will be monitored carefully while you are receiving this medicine. You will need important blood work done while you are taking this  medicine. Do not become pregnant while taking this medicine or for at least 1 year after stopping it. Women of child-bearing potential will need to have a negative pregnancy test before starting this medicine. Women should inform their doctor if they wish to become pregnant or think they might be pregnant. There is a potential for serious side effects to an unborn child. Men should inform their doctors if they wish to father a child. This medicine may lower sperm counts. Talk to your health care professional or pharmacist for more information. Do not breast-feed an infant while taking this medicine or for 1 year after the last dose. What side effects may I notice from receiving this medication? Side effects that you should report to your doctor or health care professional as soon as possible: allergic reactions like skin rash, itching or hives, swelling of the face, lips, or tongue feeling faint or lightheaded, falls pain, tingling, numbness, or weakness in the legs signs and symptoms of infection like fever or chills; cough; flu-like symptoms; sore throat vaginal bleeding Side effects that usually do not require medical attention (report to your doctor or health care professional if they continue or are bothersome): aches, pains constipation diarrhea headache hot flashes nausea, vomiting pain at site where injected stomach pain This list may not describe all possible side effects. Call your doctor for medical advice about side effects. You may report side effects to FDA at 1-800-FDA-1088. Where should I keep my medication? This drug is given in a hospital or clinic and will not be stored at home. NOTE: This sheet is a summary. It may not cover all possible information. If you have   questions about this medicine, talk to your doctor, pharmacist, or health care provider.  2022 Elsevier/Gold Standard (2017-08-24 11:34:41)  

## 2021-01-21 NOTE — Consult Note (Signed)
Chief Complaint: Back pain  Referring Physician(s): Mathews,Elizabeth G  History of Present Illness: Alicia Vasquez is a 80 y.o. female presenting today as a scheduled consultation for lumbar back pain, kindly referred by Dr. Rodena Piety, for candidacy for possible vertebral augmentation.   Alicia Vasquez is here today with her friend for the appointment.   She tells me that she has been using a wheelchair for her mobility for some time, and that a few weeks ago had a fall at home while transferring herself from her wheel chair to a bedside commode.    She was admitted 01/05/21 for weakness and diagnosed with UTI/ascending urinary infection, treated by Urology for obstructing stone with stenting.  CT shows questionable new fractures of T11, T12, and L4.   MRI was performed which shows acute/subacute L4 and L5 compression fracture.  Chronic T12 fx.  T11 was not imaged.   Alicia Vasquez tells me that while she is currently comfortable, she frequently has 10/10 intensity pain in the lower back.  She takes vicodin for pain, which is effective.  She has been taking opioid medication for a while, given that she has unremitting pain involving a left femoral reconstruction from metastatic disease.    Her mobility is limited by her pain and weakness, and most of her mobility is in a wheelchair.  Currently she is in SNF, but lives typically with her friend.    She continues on ABX now for the UTI, which she tells me she has frequently.  She tells me she will be treated by Urology in the future for her infected stones.    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   Personal history of radiation therapy    Pneumonia    Radiation  11/21/13-01/07/14   Right Breast/Supraclavicular    Past Surgical History:  Procedure Laterality Date   BREAST LUMPECTOMY Left 2015   BREAST LUMPECTOMY WITH RADIOACTIVE SEED LOCALIZATION Right 09/09/2013   Procedure: BREAST LUMPECTOMY WITH RADIOACTIVE SEED LOCALIZATION WITH AXILLARY NODE EXCISION;  Surgeon: [name], MD;  Location: [facility];  Service: General;  Laterality: Right;   CYSTOSCOPY W/ URETERAL STENT PLACEMENT Right 01/07/2021   Procedure: CYSTOSCOPY WITH RETROGRADE PYELOGRAM/URETERAL STENT PLACEMENT;  Surgeon: Bjorn Loser, MD;  Location: [facility];  Service: Urology;  Laterality: Right;   DENTAL SURGERY  04/19/2012   13 TEETH REMOVED   DILATION AND CURETTAGE OF UTERUS     IR RADIOLOGIST EVAL & MGMT  01/21/2021   ORIF PERIPROSTHETIC FRACTURE Left 01/31/2017   Procedure: REVISION and OPEN REDUCTION INTERNAL FIXATION (ORIF) PERIPROSTHETIC FRACTURE LEFT HIP;  Surgeon: Paralee Cancel, MD;  Location: [facility];  Service: Orthopedics;  Laterality: Left;  120 mins   RE-EXCISION OF BREAST LUMPECTOMY Right 09/24/2013   Procedure: RE-EXCISION OF RIGHT BREAST LUMPECTOMY;  Surgeon: [name], MD;  Location: [facility];  Service: General;  Laterality: Right;   TOTAL HIP ARTHROPLASTY Left 01/16/2017   Procedure: TOTAL HIP ARTHROPLASTY POSTERIOR;  Surgeon: Paralee Cancel, MD;  Location: [facility];  Service: Orthopedics;  Laterality: Left;    Allergies: Anesthetics, amide; Benadryl [diphenhydramine hcl]; Carbocaine [mepivacaine hcl]; Codeine; Epinephrine;  Sulfa antibiotics; Diphenhydramine; Latex; Penicillins; and Tramadol  Medications: Prior to Admission medications   Medication Sig Start Date End Date Taking? Authorizing Provider  B-D TB SYRINGE 1CC/27GX1/2" 27G X 1/2" 1 ML MISC USE TO DRAW UP 1 ML LIDOCAINE AND INJECT INTO CEFTRIAXONE VIAL FOR DILUTION. CHANGE TO 25G NEEDLE FOR ADMINISTRATION 02/20/20   [provider]  cefdinir (OMNICEF) 300 MG  capsule Take 1 capsule (300 mg total) by mouth 2 (two) times daily. 01/12/21   Georgette Shell, MD  Cholecalciferol (VITAMIN D3) 5000 UNITS TABS Take 5,000 Units by mouth daily.     [provider]  clotrimazole-betamethasone (LOTRISONE) lotion Apply topically 2 (two) times daily. 07/27/20   Tanner, Lyndon Code., PA-C  ketoconazole (NIZORAL) 2 % cream Apply 1 application topically daily. 12/27/18   Gardenia Phlegm, NP  NEEDLE, DISP, 25 G 25G X 1-1/2" MISC Please use to administer rocephin injection 02/20/20   Leamon Arnt, MD  SYNTHROID 125 MCG tablet TAKE 1 TABLET BY MOUTH [place] Patient taking differently: Take 125 mcg by mouth daily before breakfast. 01/01/20   Magrinat, [name], MD  Syringe, Disposable, 5 ML MISC Please use to inject rocephin injection 02/20/20   Leamon Arnt, MD  vitamin C (ASCORBIC ACID) 250 MG tablet Take 250 mg by mouth 3 (three) times daily.    [provider]  warfarin (COUMADIN) 2.5 MG tablet Take 1 tablet (2.5 mg total) by mouth one time only at 4 PM. 01/12/21   Georgette Shell, MD     Family History  Problem Relation Age of Onset   Heart disease Brother    Colon cancer Brother    Prostate cancer Brother     Social History   Socioeconomic History   Marital status: Widowed    Spouse name: Not on file   Number of children: 1   Years of education: Not on file   Highest education level: Not on file  Occupational History   Not on file  Tobacco Use   Smoking status: Never   Smokeless tobacco: Never  Vaping Use   Vaping Use: Never used  Substance and Sexual Activity   Alcohol use: No    Alcohol/week: 0.0 standard drinks   Drug use: No   Sexual activity: Never  Other Topics Concern   Not on file  Social History Narrative   Exercise: yard work.   Social Determinants of Health   Financial Resource Strain: Low Risk    Difficulty of Paying Living Expenses: Not hard at all  Food Insecurity: No Food  Insecurity   Worried About Charity fundraiser in the Last Year: Never true   [place] in the Last Year: Never true  Transportation Needs: No Transportation Needs   Lack of Transportation (Medical): No   Lack of Transportation (Non-Medical): No  Physical Activity: Inactive   Days of Exercise per Week: 0 days   Minutes of Exercise per Session: 0 min  Stress: No Stress Concern Present   Feeling of Stress : Not at all  Social Connections: Socially Isolated   Frequency of Communication with Friends and Family: More than three times a week   Frequency of Social Gatherings with Friends and Family: Not on file   Attends Religious Services: Never   Marine scientist or Organizations: No   Attends Archivist Meetings: Never   Marital Status: Widowed       Review of Systems: A  12 point ROS discussed and pertinent positives are indicated in the HPI above.  All other systems are negative.  Review of Systems  Vital Signs: BP (!) 163/65 (BP Location: Left Arm)   Pulse 82   SpO2 96%   Physical Exam General: 80 yo female appearing stated age.  Well-developed, obese, comfortable in a wheelchair.  No distress. HEENT: Atraumatic, normocephalic.  Conjugate gaze, extra-ocular motor intact. Presbycusis. No scleral icterus or scleral injection. No lesions on external ears, nose, lips, or gums.  Oral mucosa moist, pink.  Neck: Symmetric with no goiter enlargement.  Chest/Lungs:  Symmetric chest with inspiration/expiration.  No labored breathing.  Clear to auscultation with no wheezes, rhonchi, or rales.  Heart:  RRR, with no third heart sounds appreciated. No JVD appreciated.  Abdomen:  Soft, NT/ND, with + bowel sounds.   Genito-urinary: Deferred Neurologic: Alert & Oriented to person, place, and time.   Normal affect and insight.  Appropriate questions.   Pulse Exam:  No bruit appreciated.  Palpable radial pulses, PT and DP bilateral.  MSK: TTP at the thoracolumbar junction.   I cannot elicit any reproducible pain at the lower lumbar region. No crepitus Extremities: Pitting edema bilateral ankles/feet  Mallampati Score:     Imaging: DG Chest 1 View  Result Date: 01/07/2021 CLINICAL DATA:  Shortness of breath. EXAM: CHEST  1 VIEW COMPARISON:  January 04, 2021. FINDINGS: The heart size and mediastinal contours are within normal limits. Both lungs are clear. The visualized skeletal structures are unremarkable. IMPRESSION: No active disease. Electronically Signed   By: Marijo Conception M.D.   On: 01/07/2021 14:46   DG Chest 2 View  Result Date: 01/04/2021 CLINICAL DATA:  Pain, mechanical fall EXAM: CHEST - 2 VIEW COMPARISON:  01/16/2017 FINDINGS: The heart size and mediastinal contours are within normal limits. Both lungs are clear. The visualized skeletal structures are unremarkable. IMPRESSION: No active cardiopulmonary disease. Electronically Signed   By: Donavan Foil M.D.   On: 01/04/2021 23:31   DG Pelvis 1-2 Views  Result Date: 01/04/2021 CLINICAL DATA:  Pain after fall EXAM: PELVIS - 1-2 VIEW COMPARISON:  06/04/2020, 02/01/2017 FINDINGS: Left hip replacement with intact hardware and normal alignment. No definite acute displaced fracture is seen. Pubic symphysis and rami appear intact. Mild periprosthetic lucency at the left trochanter. IMPRESSION: Left hip replacement without acute osseous abnormality Electronically Signed   By: Donavan Foil M.D.   On: 01/04/2021 23:33   CT Head Wo Contrast  Result Date: 01/04/2021 CLINICAL DATA:  [place] out of wheelchair EXAM: CT HEAD WITHOUT CONTRAST CT CERVICAL SPINE WITHOUT CONTRAST TECHNIQUE: Multidetector CT imaging of the head and cervical spine was performed following the standard protocol without intravenous contrast. Multiplanar CT image reconstructions of the cervical spine were also generated. COMPARISON:  CT brain and cervical spine 09/25/2015 . FINDINGS: CT HEAD FINDINGS Brain: no acute territorial infarction, hemorrhage,  or intracranial mass. Interval encephalomalacia at the right frontal lobe. Mild atrophy and chronic small vessel ischemic changes of the white matter Vascular: No hyperdense vessels.  Carotid vascular calcification Skull: Normal. Negative for fracture or focal lesion. Sinuses/Orbits: No acute finding. Other: None CT CERVICAL SPINE FINDINGS Alignment: No subluxation.  Facet alignment within normal limits. Skull base and vertebrae: No acute fracture. No primary bone lesion or focal pathologic process. Incomplete fusion left posterior arch of C1 Soft tissues and spinal canal: No prevertebral fluid or swelling. No visible canal hematoma. Disc levels: Moderate severe degenerative changes C4-C5 and C5-C6. Facet  degenerative changes at multiple levels with foraminal narrowing. Upper chest: Negative. Other: None IMPRESSION: 1. No CT evidence for acute intracranial abnormality. Interval right frontal lobe encephalomalacia. Mild atrophy and chronic small vessel ischemic changes of the white matter 2. Degenerative changes of the cervical spine. No acute osseous abnormality Electronically Signed   By: Donavan Foil M.D.   On: 01/04/2021 23:24   CT Cervical Spine Wo Contrast  Result Date: 01/04/2021 CLINICAL DATA:  [place] out of wheelchair EXAM: CT HEAD WITHOUT CONTRAST CT CERVICAL SPINE WITHOUT CONTRAST TECHNIQUE: Multidetector CT imaging of the head and cervical spine was performed following the standard protocol without intravenous contrast. Multiplanar CT image reconstructions of the cervical spine were also generated. COMPARISON:  CT brain and cervical spine 09/25/2015 . FINDINGS: CT HEAD FINDINGS Brain: no acute territorial infarction, hemorrhage, or intracranial mass. Interval encephalomalacia at the right frontal lobe. Mild atrophy and chronic small vessel ischemic changes of the white matter Vascular: No hyperdense vessels.  Carotid vascular calcification Skull: Normal. Negative for fracture or focal lesion.  Sinuses/Orbits: No acute finding. Other: None CT CERVICAL SPINE FINDINGS Alignment: No subluxation.  Facet alignment within normal limits. Skull base and vertebrae: No acute fracture. No primary bone lesion or focal pathologic process. Incomplete fusion left posterior arch of C1 Soft tissues and spinal canal: No prevertebral fluid or swelling. No visible canal hematoma. Disc levels: Moderate severe degenerative changes C4-C5 and C5-C6. Facet degenerative changes at multiple levels with foraminal narrowing. Upper chest: Negative. Other: None IMPRESSION: 1. No CT evidence for acute intracranial abnormality. Interval right frontal lobe encephalomalacia. Mild atrophy and chronic small vessel ischemic changes of the white matter 2. Degenerative changes of the cervical spine. No acute osseous abnormality Electronically Signed   By: Donavan Foil M.D.   On: 01/04/2021 23:24   MR LUMBAR SPINE WO CONTRAST  Result Date: 01/07/2021 CLINICAL DATA:  Initial evaluation for lower back pain. EXAM: MRI LUMBAR SPINE WITHOUT CONTRAST TECHNIQUE: Multiplanar, multisequence MR imaging of the lumbar spine was performed. No intravenous contrast was administered. COMPARISON:  CT from 01/05/2021. FINDINGS: Segmentation: Standard. Lowest well-formed disc space labeled the L5-S1 level. Alignment: Mild scoliosis. Trace 2 mm anterolisthesis of L3 on L4. Straightening of the normal lumbar lordosis elsewhere. Vertebrae: Compression deformity involving the T12 vertebral body with up to 50% central height loss and trace bony retropulsion is chronic in appearance. There is an acute to subacute compression fracture involving the superior endplate of L4 with mild 10% height loss without bony retropulsion. Marrow edema extends into the right L4 pedicle. Additional acute to subacute compression fracture seen involving the superior endplate of L5 with no more than mild 10% height loss without bony retropulsion. These are benign/mechanical in  appearance. Underlying bone marrow signal intensity mildly heterogeneous but within normal limits. No worrisome osseous lesions. No other abnormal marrow edema. Conus medullaris and cauda equina: Conus extends to the T12 level. Conus and cauda equina appear normal. Paraspinal and other soft tissues: Small volume edema/free fluid noted within the presacral space, nonspecific. Paraspinous soft tissues demonstrate no other acute finding. Visualized visceral structures grossly unremarkable. Disc levels: T12-L1: Minimal disc bulge with bony retropulsion related to the chronic T12 compression fracture. Probable superimposed left extraforaminal disc protrusion (series 9, image 9). No significant spinal stenosis. Foramina remain patent. L1-2: Degenerative intervertebral disc space narrowing with diffuse disc bulge. Associated reactive endplate spurring, greater on the right. There is a superimposed right foraminal to extraforaminal disc protrusion contacting the exiting right L1 nerve  root (series 9, image 15). Additional tiny left subarticular disc protrusion minimally indents the left ventral thecal sac (series 8, image 15). Mild facet hypertrophy. No significant spinal stenosis. Mild right L1 foraminal narrowing. Left neural foramina remains patent. L2-3: Disc bulge with reactive endplate spurring. Moderate bilateral facet hypertrophy. Resultant mild canal with bilateral lateral recess stenosis. Mild bilateral L2 foraminal narrowing. L3-4: Trace anterolisthesis. Mild disc bulge, eccentric to the left. Superimposed shallow broad-based left foraminal to extraforaminal disc protrusion contacts the exiting left L3 nerve root (series 9, image 27). Moderate facet hypertrophy. Resultant mild canal with bilateral lateral recess stenosis. Mild left L3 foraminal narrowing. Right neural foramen remains patent. L4-5: Disc desiccation with mild disc bulge. Superimposed left subarticular disc protrusion with slight inferior migration  (series 8, image 34). Moderate facet and ligament flavum hypertrophy. Associated small joint effusions. Resultant severe canal with severe left worse than right lateral recess stenosis. Mild bilateral L4 foraminal narrowing. L5-S1: Disc desiccation. Superimposed shallow left extraforaminal disc protrusion closely approximates the exiting left L5 nerve root (series 9, image 39). Moderate facet hypertrophy. No significant spinal stenosis. Foramina remain patent. IMPRESSION: 1. Acute to subacute compression fractures involving the superior endplates of L4 and L5. No more than mild height loss without bony retropulsion. 2. Additional chronic T12 compression fracture. 3. Multifactorial degenerative changes at L4-5 with resultant severe canal with left worse than right lateral recess stenosis. 4. Foraminal to extraforaminal disc protrusions on the right at L1-2, on the left at L3-4, and on the left at L5-S1, potentially affecting the right L1, left L3, and left L5 nerve roots respectively. 5. Small volume edema/free fluid within the presacral space, nonspecific, and could be reactive in nature. Electronically Signed   By: Jeannine Boga M.D.   On: 01/07/2021 00:18   DG C-Arm 1-60 Min-No Report  Result Date: 01/07/2021 Fluoroscopy was utilized by the requesting physician.  No radiographic interpretation.   CT RENAL STONE STUDY  Result Date: 01/05/2021 CLINICAL DATA:  80 year old female status post fall yesterday. Weakness. Acute renal failure. EXAM: CT ABDOMEN AND PELVIS WITHOUT CONTRAST TECHNIQUE: Multidetector CT imaging of the abdomen and pelvis was performed following the standard protocol without IV contrast. COMPARISON:  CT Abdomen and Pelvis 01/15/2017.  Chest CT 03/18/2019. FINDINGS: Lower chest: Respiratory motion and lower lung volumes. Chronic benign calcified granuloma in the right costophrenic angle with associated mild atelectasis or scarring today. No pericardial or pleural effusion.  Hepatobiliary: Negative noncontrast liver and gallbladder. Pancreas: Mild pancreatic atrophy. Spleen: Negative; small chronic calcified granulomas. Adrenals/Urinary Tract: Adrenal glands remain within normal limits. Negative noncontrast left kidney and left ureter. Moderate to severe right hydronephrosis with right nephromegaly and acute perinephric stranding. Bulky 11 mm intrarenal calculus. Obstructing 6 mm calculus in the proximal right ureter just distal to the ureteropelvic junction (coronal image 82). Distal to that stone the right ureter is decompressed. There is continued periureteral stranding toward the pelvis. Both distal ureters appear decompressed and normal. Diminutive urinary bladder. Incidental pelvic phleboliths. Stomach/Bowel: Redundant large bowel with diverticulosis. No large bowel inflammation. Normal appendix on series 2, image 64. Negative terminal ileum. No dilated small bowel. Moderate gastric hiatal hernia. Otherwise negative stomach and duodenum. No free air. No abdominal free fluid. Vascular/Lymphatic: Aortoiliac calcified atherosclerosis. Normal caliber abdominal aorta. Vascular patency is not evaluated in the absence of IV contrast. No lymphadenopathy. Reproductive: Negative noncontrast appearance. Other: Mild presacral stranding is new.  No pelvic free fluid. Musculoskeletal: Osteopenia. No destructive osseous lesion identified. However, T12 compression  fracture has progressed since 2020, now moderate to severe (series 5, image 105. Furthermore, mild compression of the T11 vertebral body is new since 2020. Mild compression of the L4 superior endplate is new since [id number]. No significant retropulsion at these levels. Left hip arthroplasty is new since 2018. IMPRESSION: 1. Acute obstructive uropathy on the right with a 6 mm proximal right ureteral calculus just distal to the UPJ. Superimposed bulky 11 mm right intrarenal calculus. 2. Osteopenia with new and progressed lower thoracic and  lumbar compression fractures since 2020: T11, T12, and L4. If specific therapy such as vertebroplasty is desired, Lumbar MRI or Nuclear Medicine Whole-body Bone Scan would best determine acuity. 3. No other acute or inflammatory process identified in the noncontrast abdomen or pelvis. 4. Aortic Atherosclerosis (ICD10-I70.0). Electronically Signed   By: Genevie Ann M.D.   On: 01/05/2021 10:30   IR Radiologist Eval & Mgmt  Result Date: 01/21/2021 Please refer to notes tab for details about interventional procedure. (Op Note)   Labs:  CBC: Recent Labs    01/08/21 0517 01/09/21 0249 01/10/21 0325 01/21/21 1047  WBC 15.7* 15.5* 10.8* 6.8  HGB 10.4* 10.6* 10.6* 11.6*  HCT 33.7* 33.7* 33.5* 36.4  PLT 96* 112* 131* 279    COAGS: Recent Labs    01/10/21 0325 01/11/21 0433 01/12/21 0426 01/21/21 1047  INR 3.0* 3.2* 2.9* 1.3*    BMP: Recent Labs    01/08/21 0517 01/09/21 0249 01/10/21 0325 01/21/21 1047  NA 139 135 137 140  K 4.1 3.5 3.3* 4.0  CL 107 105 105 106  CO2 '22 24 25 27  '$ GLUCOSE 150* 132* 109* 112*  BUN 38* 45* 33* 14  CALCIUM 10.3 10.3 10.1 10.7*  CREATININE 1.28* 1.09* 0.85 0.80  GFRNONAA 42* 51* >60 >60    LIVER FUNCTION TESTS: Recent Labs    11/26/20 1059 12/24/20 1127 01/04/21 2330 01/21/21 1047  BILITOT 0.4 0.4 1.6* 0.5  AST 13* 12* 20 14*  ALT '11 13 20 20  '$ ALKPHOS 104 152* 91 156*  PROT 7.0 7.1 7.3 6.8  ALBUMIN 3.6 3.7 3.8 3.4*    TUMOR MARKERS: No results for input(s): AFPTM, CEA, CA199, CHROMGRNA in the last 8760 hours.  Assessment and Plan:  Alicia Secoy is 80 yo female with recent hospitalization for ascending UTI, infected nephrolithiasis, treated for hydro with right ureteral stent.   She was incidentally found on CT to have questionable new fractures of T11, T12, L4.  MRI lumbar spine showed chronic T12 fracture, with acute/subacute fracture of L4 and L5. MRI of the thoracic spine was not performed, and T11 was not included on the lumbar  MRI.   I had a lengthy discussion with her and her friend regarding the anatomy, pathology, pathophysiology, and natural history of compression fractures, including the historical treatment of surgery, conservative management, and contemporary treatment option of vertebral augmentation with VP or KP.    I offered her informed consent regarding conservative management versus treatment, with risks to include: bleeding, infection, local injury, nerve injury/impairment, need for further procedure/surgery, cement embolization, contrast reaction, cardiopulmonary collapse, death.   I am suspicious of the T11 level given her physical exam and the CT scan.   However, after our discussion and the knowledge of her infected stone disease, I did let her know that she is currently not a candidate for therapy, as this is contra-indicated in the setting of infection.   My impression is that conservative care is indicated at this time, and  that we would expect her to heal these sites in 6-8 weeks.  Should she still be symptomatic after this time frame and treatment of infected stones with clearing of UTI, reassess is reasonable.    She understands, and agrees that she can contact us in the future if need be.    Plan: - Conservative/current management as above, given contra-indication to KP in the setting of chronically-infected stones/UTI.    Electronically Signed: Corrie Mckusick 01/21/2021, 3:53 PM   I spent a total of  40 Minutes   in face to face in clinical consultation, greater than 50% of which was counseling/coordinating care for T11/T12, L4-L5 fracture, possible vertebral augmentation.

## 2021-01-21 NOTE — Progress Notes (Signed)
[place]  Telephone:([phone]) [phone] Fax:(336) [phone]     ID: Alicia Vasquez OB: 1940-08-14  MR#: [id number]  SWN#:[id number]  Patient Care Team: [name], MD as PCP - General (Family Medicine) Philemon Kingdom, MD as Consulting Physician (Internal Medicine) Caprice Renshaw, MD as Referring Physician (Internal Medicine) Magrinat, [name], MD as Consulting Physician (Oncology) [name], DDS as Consulting Physician (Dentistry) Regal, Tamala Fothergill, DPM as Consulting Physician (Podiatry) Bjorn Loser, MD as Consulting Physician (Urology) OTHER MD:   CHIEF COMPLAINT: Estrogen receptor positive breast cancer  CURRENT TREATMENT:  Fulvestrant; warfarin   INTERVAL HISTORY: Alicia Vasquez returns today for follow-up and treatment of her metastatic estrogen receptor positive breast cancer.  She is accompanied by a friend  Kae had a brief admission for renal failure associated with the renal stone and urinary infection.  She was stented treated with antibiotics and discharged to [facility] where she is receiving physical therapy.  She is here today to receive fulvestrant as she does every 28 days.  We also check her warfarin labs and currently the INR is nontherapeutic at 1.3.  Her warfarin was decreased during the hospitalization.  She is scheduled for her annual screening mammogram on 02/23/2021.  REVIEW OF SYSTEMS: Alicia Vasquez tells me that the physical therapy is good at [facility] but she does not like the food which is always served cold, and she says she gets no attention after physical therapy during the rest of the day.  Part of that may be because she was under quarantine initially.  Hopefully she will be able to participate more soon.   COVID 19 VACCINATION STATUS:    BREAST CANCER HISTORY: From doctor [name]'s intake node 07/24/2013:  "80 y.o. female. Who presented with SOB and had a CT chest performed that revealed a right breast mass.  Mammogram/ultrsound on 2/13 showed a mass in the 11 o'clock position in the right breast measuring 1.5 cm. Also noted was a right axillary LN measuring 1.6 cm. MRI not performed. Biopsy of mass and lymph done. 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 Lymph node + for metastatic carcinoma."  [On 09/09/2013 the patient underwent right lumpectomy and sentinel lymph node sampling. This showed (SZA [id number]) multifocal invasive ductal carcinoma, grade 1, the largest lesion measuring 1.8 cm, the second lesion 1.2 cm. One of 4 sentinel lymph nodes was positive, with extracapsular extension. Margins were positive. HER-2 was repeated and was again negative. Further surgery 09/16/2013 obtained clear margins.  Her subsequent history is as detailed below   PAST MEDICAL HISTORY: Past Medical History:  Diagnosis Date   Allergy    Anxiety    Arthritis    Blood transfusion without reported diagnosis    Breast cancer ([place]) 07/12/2013   Invasive Mammary Carcinoma   DVT (deep vein thrombosis) in pregnancy    Hypertension    Hypothyroid    Metastatic cancer to bone ([place]) dx'd 12/2016   hip   Personal history of radiation therapy    Pneumonia    Radiation 11/21/13-01/07/14   Right Breast/Supraclavicular    PAST SURGICAL HISTORY: Past Surgical History:  Procedure Laterality Date   BREAST LUMPECTOMY Left 2015   BREAST LUMPECTOMY WITH RADIOACTIVE SEED LOCALIZATION Right 09/09/2013   Procedure: BREAST LUMPECTOMY WITH RADIOACTIVE SEED LOCALIZATION WITH AXILLARY NODE EXCISION;  Surgeon: [name], MD;  Location: [facility];  Service: General;  Laterality: Right;  CYSTOSCOPY W/ URETERAL STENT PLACEMENT Right 01/07/2021   Procedure: CYSTOSCOPY WITH RETROGRADE PYELOGRAM/URETERAL STENT PLACEMENT;  Surgeon: Bjorn Loser, MD;  Location: [facility];  Service: Urology;  Laterality: Right;    DENTAL SURGERY  04/19/2012   13 TEETH REMOVED   DILATION AND CURETTAGE OF UTERUS     IR RADIOLOGIST EVAL & MGMT  01/21/2021   ORIF PERIPROSTHETIC FRACTURE Left 01/31/2017   Procedure: REVISION and OPEN REDUCTION INTERNAL FIXATION (ORIF) PERIPROSTHETIC FRACTURE LEFT HIP;  Surgeon: Paralee Cancel, MD;  Location: [facility];  Service: Orthopedics;  Laterality: Left;  120 mins   RE-EXCISION OF BREAST LUMPECTOMY Right 09/24/2013   Procedure: RE-EXCISION OF RIGHT BREAST LUMPECTOMY;  Surgeon: [name], MD;  Location: [facility];  Service: General;  Laterality: Right;   TOTAL HIP ARTHROPLASTY Left 01/16/2017   Procedure: TOTAL HIP ARTHROPLASTY POSTERIOR;  Surgeon: Paralee Cancel, MD;  Location: [facility];  Service: Orthopedics;  Laterality: Left;    FAMILY HISTORY Family History  Problem Relation Age of Onset   Heart disease Brother    Colon cancer Brother    Prostate cancer Brother    the patient's father died at the age of 65 after an automobile accident. The patient's mother died at the age of 89. She was a Marine scientist here in [name] in the old [facility]. She was infected with polio and was confined to a wheelchair for a good part of her life. She eventually died of pneumonia. The patient had one brother, who died with prostate cancer. She had no sisters. There is no history of breast or ovarian cancer in the family.   GYNECOLOGIC HISTORY:  Menarche age 50, first live birth age 52, the patient is GX P1. She went through the change of life at age 71. 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 She is widowed. Currently she is staying with her friend [name], 21 years old, who is a retired Radio producer.  Chana tells me [name] daughter has recently left her husband, put her children in foster care, and moved in with them.  The patient's son [name] lived in [place] but  currently he lives in [place] only home.  He lost his wife in 2008, is not currently employed although he occasionally does odd jobs, Kaitlan says.. The patient tells me if [name] dies she would be moving back into her own home with her son. The patient has no grandchildren. She is a Tourist information centre manager but currently attends a [employer] with her friend [name]    ADVANCED DIRECTIVES: Not in place   HEALTH MAINTENANCE: Social History   Tobacco Use   Smoking status: Never   Smokeless tobacco: Never  Vaping Use   Vaping Use: Never used  Substance Use Topics   Alcohol use: No    Alcohol/week: 0.0 standard drinks   Drug use: No     Colonoscopy: Never  PAP:  Bone density: 09/20/2016 showed a T score of -2.2  Lipid panel:  Allergies  Allergen Reactions   Anesthetics, Amide Hypertension   Benadryl [Diphenhydramine Hcl] Other (See Comments)    Dizziness   Carbocaine [Mepivacaine Hcl] Hypertension   Codeine Other (See Comments)    Dizziness   Epinephrine Hypertension   Sulfa Antibiotics Other (See Comments)    dizziness   Diphenhydramine    Latex Other (See Comments)    Blisters in mouth   Penicillins Rash  Has patient had a PCN reaction causing immediate rash, facial/tongue/throat swelling, SOB or lightheadedness with hypotension: Unknown Has patient had a PCN reaction causing severe rash involving mucus membranes or skin necrosis: Unknown Has patient had a PCN reaction that required hospitalization: Unknown Has patient had a PCN reaction occurring within the last 10 years: Unknown If all of the above answers are "NO", then may proceed with Cephalosporin use.    Tramadol Other (See Comments)    Sedation.     Current Outpatient Medications  Medication Sig Dispense Refill   B-D TB SYRINGE 1CC/27GX1/2" 27G X 1/2" 1 ML MISC USE TO DRAW UP 1 ML LIDOCAINE AND INJECT INTO CEFTRIAXONE VIAL FOR DILUTION. CHANGE TO 25G NEEDLE FOR ADMINISTRATION     cefdinir (OMNICEF) 300 MG capsule  Take 1 capsule (300 mg total) by mouth 2 (two) times daily. 14 capsule 0   Cholecalciferol (VITAMIN D3) 5000 UNITS TABS Take 5,000 Units by mouth daily.      clotrimazole-betamethasone (LOTRISONE) lotion Apply topically 2 (two) times daily. 60 mL 0   ketoconazole (NIZORAL) 2 % cream Apply 1 application topically daily. 15 g 0   NEEDLE, DISP, 25 G 25G X 1-1/2" MISC Please use to administer rocephin injection 15 each 0   SYNTHROID 125 MCG tablet TAKE 1 TABLET BY MOUTH EVERY DAY BEFORE BREAKFAST (Patient taking differently: Take 125 mcg by mouth daily before breakfast.) 90 tablet 4   Syringe, Disposable, 5 ML MISC Please use to inject rocephin injection 5 each 0   vitamin C (ASCORBIC ACID) 250 MG tablet Take 250 mg by mouth 3 (three) times daily.     warfarin (COUMADIN) 2.5 MG tablet Take 1 tablet (2.5 mg total) by mouth one time only at 4 PM. 30 tablet 0   No current facility-administered medications for this visit.    OBJECTIVE: white woman examined in a wheelchair  Vitals:   01/21/21 1154  BP: (!) 170/69  Pulse: 82  Resp: 18  Temp: 98.1 F (36.7 C)  SpO2: 97%    Wt Readings from Last 3 Encounters:  01/21/21 222 lb 14.4 oz (101.1 kg)  01/07/21 233 lb 0.4 oz (105.7 kg)  10/19/20 227 lb 6.4 oz (103.1 kg)   Body mass index is 38.26 kg/m.    ECOG FS:2 - Symptomatic, <50% confined to bed  Sclerae unicteric, EOMs intact Wearing a mask No cervical or supraclavicular adenopathy Lungs no rales or rhonchi Heart regular rate and rhythm Abd soft, nontender, positive bowel sounds MSK no focal spinal tenderness, no upper extremity lymphedema Neuro: nonfocal, well oriented, appropriate affect Breasts: The right breast is status postlumpectomy and radiation.  I do not palpate any suspicious masses.  The left breast and both axillae are benign.   LAB RESULTS:  CMP     Component Value Date/Time   NA 140 01/21/2021 1047   NA 141 05/16/2017 1416   K 4.0 01/21/2021 1047   K 3.8  05/16/2017 1416   CL 106 01/21/2021 1047   CO2 27 01/21/2021 1047   CO2 27 05/16/2017 1416   GLUCOSE 112 (H) 01/21/2021 1047   GLUCOSE 140 05/16/2017 1416   BUN 14 01/21/2021 1047   BUN 13.2 05/16/2017 1416   CREATININE 0.80 01/21/2021 1047   CREATININE 0.80 09/22/2017 1555   CREATININE 0.8 05/16/2017 1416   CALCIUM 10.7 (H) 01/21/2021 1047   CALCIUM 11.0 (H) 05/16/2017 1416   PROT 6.8 01/21/2021 1047   PROT 6.9 05/16/2017 1416   ALBUMIN 3.4 (L)  01/21/2021 1047   ALBUMIN 3.6 05/16/2017 1416   AST 14 (L) 01/21/2021 1047   AST 13 05/16/2017 1416   ALT 20 01/21/2021 1047   ALT <6 05/16/2017 1416   ALKPHOS 156 (H) 01/21/2021 1047   ALKPHOS 130 05/16/2017 1416   BILITOT 0.5 01/21/2021 1047   BILITOT 0.31 05/16/2017 1416   GFRNONAA >60 01/21/2021 1047   GFRNONAA 75 08/19/2015 1602   GFRAA >60 08/07/2019 1305   GFRAA >60 03/21/2018 1417   GFRAA 87 08/19/2015 1602    I No results found for: SPEP  Lab Results  Component Value Date   WBC 6.8 01/21/2021   NEUTROABS 4.4 01/21/2021   HGB 11.6 (L) 01/21/2021   HCT 36.4 01/21/2021   MCV 89.9 01/21/2021   PLT 279 01/21/2021      Chemistry      Component Value Date/Time   NA 140 01/21/2021 1047   NA 141 05/16/2017 1416   K 4.0 01/21/2021 1047   K 3.8 05/16/2017 1416   CL 106 01/21/2021 1047   CO2 27 01/21/2021 1047   CO2 27 05/16/2017 1416   BUN 14 01/21/2021 1047   BUN 13.2 05/16/2017 1416   CREATININE 0.80 01/21/2021 1047   CREATININE 0.80 09/22/2017 1555   CREATININE 0.8 05/16/2017 1416      Component Value Date/Time   CALCIUM 10.7 (H) 01/21/2021 1047   CALCIUM 11.0 (H) 05/16/2017 1416   ALKPHOS 156 (H) 01/21/2021 1047   ALKPHOS 130 05/16/2017 1416   AST 14 (L) 01/21/2021 1047   AST 13 05/16/2017 1416   ALT 20 01/21/2021 1047   ALT <6 05/16/2017 1416   BILITOT 0.5 01/21/2021 1047   BILITOT 0.31 05/16/2017 1416       No results found for: LABCA2  No components found for: LABCA125  Recent Labs  Lab  01/21/21 1047  INR 1.3*     Urinalysis    Component Value Date/Time   COLORURINE YELLOW 01/05/2021 0030   APPEARANCEUR HAZY (A) 01/05/2021 0030   LABSPEC 1.024 01/05/2021 0030   LABSPEC 1.010 09/22/2016 1553   PHURINE 5.0 01/05/2021 0030   GLUCOSEU NEGATIVE 01/05/2021 0030   GLUCOSEU Negative 09/22/2016 1553   HGBUR NEGATIVE 01/05/2021 0030   BILIRUBINUR NEGATIVE 01/05/2021 0030   BILIRUBINUR neg 12/16/2020 1116   BILIRUBINUR Negative 09/22/2016 1553   KETONESUR 5 (A) 01/05/2021 0030   PROTEINUR 100 (A) 01/05/2021 0030   UROBILINOGEN negative (A) 12/16/2020 1116   UROBILINOGEN 0.2 11/15/2016 1358   UROBILINOGEN 0.2 09/22/2016 1553   NITRITE NEGATIVE 01/05/2021 0030   LEUKOCYTESUR LARGE (A) 01/05/2021 0030   LEUKOCYTESUR Negative 09/22/2016 1553    STUDIES: DG Chest 1 View  Result Date: 01/07/2021 CLINICAL DATA:  Shortness of breath. EXAM: CHEST  1 VIEW COMPARISON:  January 04, 2021. FINDINGS: The heart size and mediastinal contours are within normal limits. Both lungs are clear. The visualized skeletal structures are unremarkable. IMPRESSION: No active disease. Electronically Signed   By: Marijo Conception M.D.   On: 01/07/2021 14:46   DG Chest 2 View  Result Date: 01/04/2021 CLINICAL DATA:  Pain, mechanical fall EXAM: CHEST - 2 VIEW COMPARISON:  01/16/2017 FINDINGS: The heart size and mediastinal contours are within normal limits. Both lungs are clear. The visualized skeletal structures are unremarkable. IMPRESSION: No active cardiopulmonary disease. Electronically Signed   By: Donavan Foil M.D.   On: 01/04/2021 23:31   DG Pelvis 1-2 Views  Result Date: 01/04/2021 CLINICAL DATA:  Pain after fall EXAM:  PELVIS - 1-2 VIEW COMPARISON:  06/04/2020, 02/01/2017 FINDINGS: Left hip replacement with intact hardware and normal alignment. No definite acute displaced fracture is seen. Pubic symphysis and rami appear intact. Mild periprosthetic lucency at the left trochanter. IMPRESSION: Left  hip replacement without acute osseous abnormality Electronically Signed   By: Donavan Foil M.D.   On: 01/04/2021 23:33   CT Head Wo Contrast  Result Date: 01/04/2021 CLINICAL DATA:  [place] out of wheelchair EXAM: CT HEAD WITHOUT CONTRAST CT CERVICAL SPINE WITHOUT CONTRAST TECHNIQUE: Multidetector CT imaging of the head and cervical spine was performed following the standard protocol without intravenous contrast. Multiplanar CT image reconstructions of the cervical spine were also generated. COMPARISON:  CT brain and cervical spine 09/25/2015 . FINDINGS: CT HEAD FINDINGS Brain: no acute territorial infarction, hemorrhage, or intracranial mass. Interval encephalomalacia at the right frontal lobe. Mild atrophy and chronic small vessel ischemic changes of the white matter Vascular: No hyperdense vessels.  Carotid vascular calcification Skull: Normal. Negative for fracture or focal lesion. Sinuses/Orbits: No acute finding. Other: None CT CERVICAL SPINE FINDINGS Alignment: No subluxation.  Facet alignment within normal limits. Skull base and vertebrae: No acute fracture. No primary bone lesion or focal pathologic process. Incomplete fusion left posterior arch of C1 Soft tissues and spinal canal: No prevertebral fluid or swelling. No visible canal hematoma. Disc levels: Moderate severe degenerative changes C4-C5 and C5-C6. Facet degenerative changes at multiple levels with foraminal narrowing. Upper chest: Negative. Other: None IMPRESSION: 1. No CT evidence for acute intracranial abnormality. Interval right frontal lobe encephalomalacia. Mild atrophy and chronic small vessel ischemic changes of the white matter 2. Degenerative changes of the cervical spine. No acute osseous abnormality Electronically Signed   By: Donavan Foil M.D.   On: 01/04/2021 23:24   CT Cervical Spine Wo Contrast  Result Date: 01/04/2021 CLINICAL DATA:  [place] out of wheelchair EXAM: CT HEAD WITHOUT CONTRAST CT CERVICAL SPINE WITHOUT CONTRAST  TECHNIQUE: Multidetector CT imaging of the head and cervical spine was performed following the standard protocol without intravenous contrast. Multiplanar CT image reconstructions of the cervical spine were also generated. COMPARISON:  CT brain and cervical spine 09/25/2015 . FINDINGS: CT HEAD FINDINGS Brain: no acute territorial infarction, hemorrhage, or intracranial mass. Interval encephalomalacia at the right frontal lobe. Mild atrophy and chronic small vessel ischemic changes of the white matter Vascular: No hyperdense vessels.  Carotid vascular calcification Skull: Normal. Negative for fracture or focal lesion. Sinuses/Orbits: No acute finding. Other: None CT CERVICAL SPINE FINDINGS Alignment: No subluxation.  Facet alignment within normal limits. Skull base and vertebrae: No acute fracture. No primary bone lesion or focal pathologic process. Incomplete fusion left posterior arch of C1 Soft tissues and spinal canal: No prevertebral fluid or swelling. No visible canal hematoma. Disc levels: Moderate severe degenerative changes C4-C5 and C5-C6. Facet degenerative changes at multiple levels with foraminal narrowing. Upper chest: Negative. Other: None IMPRESSION: 1. No CT evidence for acute intracranial abnormality. Interval right frontal lobe encephalomalacia. Mild atrophy and chronic small vessel ischemic changes of the white matter 2. Degenerative changes of the cervical spine. No acute osseous abnormality Electronically Signed   By: Donavan Foil M.D.   On: 01/04/2021 23:24   MR LUMBAR SPINE WO CONTRAST  Result Date: 01/07/2021 CLINICAL DATA:  Initial evaluation for lower back pain. EXAM: MRI LUMBAR SPINE WITHOUT CONTRAST TECHNIQUE: Multiplanar, multisequence MR imaging of the lumbar spine was performed. No intravenous contrast was administered. COMPARISON:  CT from 01/05/2021. FINDINGS: Segmentation: Standard. Lowest  well-formed disc space labeled the L5-S1 level. Alignment: Mild scoliosis. Trace 2 mm  anterolisthesis of L3 on L4. Straightening of the normal lumbar lordosis elsewhere. Vertebrae: Compression deformity involving the T12 vertebral body with up to 50% central height loss and trace bony retropulsion is chronic in appearance. There is an acute to subacute compression fracture involving the superior endplate of L4 with mild 10% height loss without bony retropulsion. Marrow edema extends into the right L4 pedicle. Additional acute to subacute compression fracture seen involving the superior endplate of L5 with no more than mild 10% height loss without bony retropulsion. These are benign/mechanical in appearance. Underlying bone marrow signal intensity mildly heterogeneous but within normal limits. No worrisome osseous lesions. No other abnormal marrow edema. Conus medullaris and cauda equina: Conus extends to the T12 level. Conus and cauda equina appear normal. Paraspinal and other soft tissues: Small volume edema/free fluid noted within the presacral space, nonspecific. Paraspinous soft tissues demonstrate no other acute finding. Visualized visceral structures grossly unremarkable. Disc levels: T12-L1: Minimal disc bulge with bony retropulsion related to the chronic T12 compression fracture. Probable superimposed left extraforaminal disc protrusion (series 9, image 9). No significant spinal stenosis. Foramina remain patent. L1-2: Degenerative intervertebral disc space narrowing with diffuse disc bulge. Associated reactive endplate spurring, greater on the right. There is a superimposed right foraminal to extraforaminal disc protrusion contacting the exiting right L1 nerve root (series 9, image 15). Additional tiny left subarticular disc protrusion minimally indents the left ventral thecal sac (series 8, image 15). Mild facet hypertrophy. No significant spinal stenosis. Mild right L1 foraminal narrowing. Left neural foramina remains patent. L2-3: Disc bulge with reactive endplate spurring. Moderate  bilateral facet hypertrophy. Resultant mild canal with bilateral lateral recess stenosis. Mild bilateral L2 foraminal narrowing. L3-4: Trace anterolisthesis. Mild disc bulge, eccentric to the left. Superimposed shallow broad-based left foraminal to extraforaminal disc protrusion contacts the exiting left L3 nerve root (series 9, image 27). Moderate facet hypertrophy. Resultant mild canal with bilateral lateral recess stenosis. Mild left L3 foraminal narrowing. Right neural foramen remains patent. L4-5: Disc desiccation with mild disc bulge. Superimposed left subarticular disc protrusion with slight inferior migration (series 8, image 34). Moderate facet and ligament flavum hypertrophy. Associated small joint effusions. Resultant severe canal with severe left worse than right lateral recess stenosis. Mild bilateral L4 foraminal narrowing. L5-S1: Disc desiccation. Superimposed shallow left extraforaminal disc protrusion closely approximates the exiting left L5 nerve root (series 9, image 39). Moderate facet hypertrophy. No significant spinal stenosis. Foramina remain patent. IMPRESSION: 1. Acute to subacute compression fractures involving the superior endplates of L4 and L5. No more than mild height loss without bony retropulsion. 2. Additional chronic T12 compression fracture. 3. Multifactorial degenerative changes at L4-5 with resultant severe canal with left worse than right lateral recess stenosis. 4. Foraminal to extraforaminal disc protrusions on the right at L1-2, on the left at L3-4, and on the left at L5-S1, potentially affecting the right L1, left L3, and left L5 nerve roots respectively. 5. Small volume edema/free fluid within the presacral space, nonspecific, and could be reactive in nature. Electronically Signed   By: Jeannine Boga M.D.   On: 01/07/2021 00:18   DG C-Arm 1-60 Min-No Report  Result Date: 01/07/2021 Fluoroscopy was utilized by the requesting physician.  No radiographic  interpretation.   CT RENAL STONE STUDY  Result Date: 01/05/2021 CLINICAL DATA:  80 year old female status post fall yesterday. Weakness. Acute renal failure. EXAM: CT ABDOMEN AND PELVIS WITHOUT CONTRAST TECHNIQUE:  Multidetector CT imaging of the abdomen and pelvis was performed following the standard protocol without IV contrast. COMPARISON:  CT Abdomen and Pelvis 01/15/2017.  Chest CT 03/18/2019. FINDINGS: Lower chest: Respiratory motion and lower lung volumes. Chronic benign calcified granuloma in the right costophrenic angle with associated mild atelectasis or scarring today. No pericardial or pleural effusion. Hepatobiliary: Negative noncontrast liver and gallbladder. Pancreas: Mild pancreatic atrophy. Spleen: Negative; small chronic calcified granulomas. Adrenals/Urinary Tract: Adrenal glands remain within normal limits. Negative noncontrast left kidney and left ureter. Moderate to severe right hydronephrosis with right nephromegaly and acute perinephric stranding. Bulky 11 mm intrarenal calculus. Obstructing 6 mm calculus in the proximal right ureter just distal to the ureteropelvic junction (coronal image 82). Distal to that stone the right ureter is decompressed. There is continued periureteral stranding toward the pelvis. Both distal ureters appear decompressed and normal. Diminutive urinary bladder. Incidental pelvic phleboliths. Stomach/Bowel: Redundant large bowel with diverticulosis. No large bowel inflammation. Normal appendix on series 2, image 64. Negative terminal ileum. No dilated small bowel. Moderate gastric hiatal hernia. Otherwise negative stomach and duodenum. No free air. No abdominal free fluid. Vascular/Lymphatic: Aortoiliac calcified atherosclerosis. Normal caliber abdominal aorta. Vascular patency is not evaluated in the absence of IV contrast. No lymphadenopathy. Reproductive: Negative noncontrast appearance. Other: Mild presacral stranding is new.  No pelvic free fluid.  Musculoskeletal: Osteopenia. No destructive osseous lesion identified. However, T12 compression fracture has progressed since 2020, now moderate to severe (series 5, image 105. Furthermore, mild compression of the T11 vertebral body is new since 2020. Mild compression of the L4 superior endplate is new since 0086. No significant retropulsion at these levels. Left hip arthroplasty is new since 2018. IMPRESSION: 1. Acute obstructive uropathy on the right with a 6 mm proximal right ureteral calculus just distal to the UPJ. Superimposed bulky 11 mm right intrarenal calculus. 2. Osteopenia with new and progressed lower thoracic and lumbar compression fractures since 2020: T11, T12, and L4. If specific therapy such as vertebroplasty is desired, Lumbar MRI or Nuclear Medicine Whole-body Bone Scan would best determine acuity. 3. No other acute or inflammatory process identified in the noncontrast abdomen or pelvis. 4. Aortic Atherosclerosis (ICD10-I70.0). Electronically Signed   By: Genevie Ann M.D.   On: 01/05/2021 10:30   IR Radiologist Eval & Mgmt  Result Date: 01/21/2021 Please refer to notes tab for details about interventional procedure. (Op Note)    ASSESSMENT: 80 y.o. 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 It also predicts no benefit from chemotherapy  (3) adjuvant radiation completed 01/07/2014  (4) anastrozole started 02/27/2014 stopped within 2 weeks because of arm swelling.   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  (a) CA 27-29 was 46.4 as of 04/18/2017  (b) chest CT scan 05/02/2017 shows no lung or liver lesion concern; it does show aortic atherosclerosis  (c) baseline bone scan 05/02/2017 was negative  (d) PET scan 09/12/2017 shows no active disease, including bone  (e) PET scan and CT chest on 05/29/2018 show no active disease  (f) chest CT and bone scan 03/18/2019 showed no evidence of active disease  (g) lumbar spine MRI 01/06/2021 shows multiple compression fractures but no obvious metastases; noncontrast cervical spine and head CT scans 01/04/2021 showed no evidence of neoplastic disease  (7) fulvestrant started 04/18/2017  (8) denosumab/Xgeva started 05/16/2017  (a) changed to every 12-weeks after 10/03/2017 dose   (b) held starting with 06/13/2018 dose due to dental concerns  (9) unprovoked deep vein thrombosis involving the left posterior tibial v documented 11/23/2012, resolved on repeat 06/27/2013 and not recurrent on multiple Dopplers since, most recent 03/06/2018  (a) on chronic anticoagulation with warfarin given ongoing risks (stage IV breast cancer, relative immobility secondary to claudication   PLAN:  Natalia is now 4 years out from definitive diagnosis of metastatic breast cancer chiefly involving bone.  Her disease is very well controlled on fulvestrant which she receives every 28 days.  She also received denosumab/Xgeva originally but because of dental issues this has been discontinued.  The plan is to  continue the fulvestrant indefinitely  She also receives warfarin.  Currently her dose is subtherapeutic.  We will increase that to 5 mg daily and repeat an INR when she returns in 4 weeks.  She knows to call for any other issue admittable before next visit  Total encounter time 25 minutes.Sarajane Jews C. Magrinat, MD 01/21/21 7:34 PM Medical Oncology and Hematology [facility] [place], [place] [address] Tel. [phone]    Fax. [phone]   I, Wilburn Mylar, am acting as scribe for Dr. [name]. Magrinat.  I, Lurline Del MD, have reviewed the above documentation for accuracy and completeness, and I agree with the above.   *Total Encounter Time as defined by the Centers for Medicare and Medicaid Services includes, in addition to the face-to-face time of a patient visit (documented in the note above) non-face-to-face time: obtaining and reviewing outside history, ordering and reviewing medications, tests or procedures, care coordination (communications with other health care professionals or caregivers) and documentation in the medical record.

## 2021-01-22 ENCOUNTER — Telehealth: Payer: Self-pay | Admitting: Oncology

## 2021-01-22 DIAGNOSIS — I10 Essential (primary) hypertension: Secondary | ICD-10-CM | POA: Diagnosis not present

## 2021-01-22 DIAGNOSIS — C50919 Malignant neoplasm of unspecified site of unspecified female breast: Secondary | ICD-10-CM | POA: Diagnosis not present

## 2021-01-22 DIAGNOSIS — M6281 Muscle weakness (generalized): Secondary | ICD-10-CM | POA: Diagnosis not present

## 2021-01-22 DIAGNOSIS — N390 Urinary tract infection, site not specified: Secondary | ICD-10-CM | POA: Diagnosis not present

## 2021-01-22 DIAGNOSIS — Z7901 Long term (current) use of anticoagulants: Secondary | ICD-10-CM | POA: Diagnosis not present

## 2021-01-22 LAB — CANCER ANTIGEN 27.29: CA 27.29: 57.8 U/mL — ABNORMAL HIGH (ref 0.0–38.6)

## 2021-01-22 NOTE — Telephone Encounter (Signed)
Scheduled appointment per 08/25 los. Left message.  

## 2021-01-25 ENCOUNTER — Telehealth: Payer: Self-pay | Admitting: *Deleted

## 2021-01-25 DIAGNOSIS — Z7901 Long term (current) use of anticoagulants: Secondary | ICD-10-CM | POA: Diagnosis not present

## 2021-01-25 DIAGNOSIS — I82409 Acute embolism and thrombosis of unspecified deep veins of unspecified lower extremity: Secondary | ICD-10-CM | POA: Diagnosis not present

## 2021-01-25 DIAGNOSIS — N390 Urinary tract infection, site not specified: Secondary | ICD-10-CM | POA: Diagnosis not present

## 2021-01-25 DIAGNOSIS — I10 Essential (primary) hypertension: Secondary | ICD-10-CM | POA: Diagnosis not present

## 2021-01-26 NOTE — Patient Instructions (Addendum)
DUE TO COVID-19 ONLY ONE VISITOR IS ALLOWED TO COME WITH YOU AND STAY IN THE WAITING ROOM ONLY DURING PRE OP AND PROCEDURE.   **NO VISITORS ARE ALLOWED IN THE SHORT STAY AREA OR RECOVERY ROOM!!**         Your procedure is scheduled on: Monday, 02-08-21   Report to [facility] Main  Entrance    Report to admitting at 7:00 AM   Call this number if you have problems the morning of surgery [phone]   Do not eat food :After Midnight.   May have liquids until 7:00 AM   day of surgery  CLEAR LIQUID DIET  Foods Allowed                                                                     Foods Excluded  Water, Black Coffee (no milk/no creamer) and tea, regular and decaf                              liquids that you cannot  Plain Jell-O in any flavor  (No red)                         see through such as: Fruit ices (not with fruit pulp)                                 milk, soups, orange juice  Iced Popsicles (No red)                                    All solid food                             Apple juices Sports drinks like Gatorade (No red) Lightly seasoned clear broth or consume(fat free) Sugar   Oral Hygiene is also important to reduce your risk of infection.                                    Remember - BRUSH YOUR TEETH THE MORNING OF SURGERY WITH YOUR REGULAR TOOTHPASTE   Do NOT smoke after Midnight   Take these medicines the morning of surgery with A SIP OF WATER:  Cephalexin, Synthroid, Escitalopram, Hydrocodone if needed                     Coumadin - to be held x5 days preop             You may not have any metal on your body including hair pins, jewelry, and body piercing             Do not wear make-up, lotions, powders, perfumes or deodorant  Do not wear nail polish including gel and S&S, artificial/acrylic nails, or any other type of covering on natural nails including finger and toenails. If you have artificial nails, gel coating, etc. that needs to be  removed  by a nail salon please have this removed prior to surgery or surgery may need to be canceled/ delayed if the surgeon/ anesthesia feels like they are unable to be safely monitored.   Do not shave  48 hours prior to surgery.        Do not bring valuables to the hospital. [place].   Contacts, dentures or bridgework may not be worn into surgery.   Patients discharged the day of surgery will not be allowed to drive home.  Please read over the following fact sheets you were given: IF YOU HAVE QUESTIONS ABOUT YOUR PRE OP INSTRUCTIONS PLEASE CALL [place] - Preparing for Surgery Before surgery, you can play an important role.  Because skin is not sterile, your skin needs to be as free of germs as possible.  You can reduce the number of germs on your skin by washing with CHG (chlorahexidine gluconate) soap before surgery.  CHG is an antiseptic cleaner which kills germs and bonds with the skin to continue killing germs even after washing. Please DO NOT use if you have an allergy to CHG or antibacterial soaps.  If your skin becomes reddened/irritated stop using the CHG and inform your nurse when you arrive at Short Stay. Do not shave (including legs and underarms) for at least 48 hours prior to the first CHG shower.  You may shave your face/neck.  Please follow these instructions carefully:  1.  Shower with CHG Soap the night before surgery and the  morning of surgery.  2.  If you choose to wash your hair, wash your hair first as usual with your normal  shampoo.  3.  After you shampoo, rinse your hair and body thoroughly to remove the shampoo.                             4.  Use CHG as you would any other liquid soap.  You can apply chg directly to the skin and wash.  Gently with a scrungie or clean washcloth.  5.  Apply the CHG Soap to your body ONLY FROM THE NECK DOWN.   Do   not use on face/ open                           Wound or  open sores. Avoid contact with eyes, ears mouth and   genitals (private parts).                       Wash face,  Genitals (private parts) with your normal soap.             6.  Wash thoroughly, paying special attention to the area where your    surgery  will be performed.  7.  Thoroughly rinse your body with warm water from the neck down.  8.  DO NOT shower/wash with your normal soap after using and rinsing off the CHG Soap.                9.  Pat yourself dry with a clean towel.            10.  Wear clean pajamas.            11.  Place clean sheets on your bed the night of your first shower and do not  sleep with pets. Day of Surgery : Do not apply any lotions/deodorants the morning of surgery.  Please wear clean clothes to the hospital/surgery center.  FAILURE TO FOLLOW THESE INSTRUCTIONS MAY RESULT IN THE CANCELLATION OF YOUR SURGERY  PATIENT SIGNATURE_________________________________  NURSE SIGNATURE__________________________________  ________________________________________________________________________

## 2021-01-26 NOTE — Progress Notes (Addendum)
COVID swab appointment: NA  COVID Vaccine Completed:  No Date COVID Vaccine completed: Has received booster: COVID vaccine manufacturer: [employer]   Date of COVID positive in last 90 days:  No  PCP - Dimas Chyle, MD Cardiologist - Kirk Ruths, MD.  Last OV 2015 with cardiology  Chest x-ray - 01-07-21 Epic EKG - 01-07-21 Epic  Stress Test - greater than 2 years ECHO - 01-17-17 Epic Cardiac Cath - N/A Pacemaker/ICD device last checked: Spinal Cord Stimulator:  Sleep Study - N/A CPAP -   Fasting Blood Sugar - N/A Checks Blood Sugar _____ times a day  Blood Thinner Instructions:  Coumadin 5 mg.  Per telephone note in Epic, patient to hold Coumadin x5 days and this has been discussed with the nurse at [facility]. Aspirin Instructions: Last Dose:  Activity level:  Unable to go up a flight of stairs due to back pain and neuropathy.  Patient currently unable to perform ADL's due to recent fall.  She is a resident at [facility] currently and unsure if she will be able to return home.       Anesthesia review:  Evaluated by cardiology for chest pain, SOB.  HTN.  Patient states that she will having episodes of chest pain and shortness of breath but denies symptoms at PAT, said she seems to notice it when she is upset.  BP elevated at PAT 179/84, recheck 178/73  Patient denies shortness of breath, fever, cough and chest pain at PAT appointment   Patient verbalized understanding of instructions that were given to them at the PAT appointment. Patient was also instructed that they will need to review over the PAT instructions again at home before surgery.

## 2021-01-27 ENCOUNTER — Encounter: Payer: Self-pay | Admitting: Oncology

## 2021-01-27 NOTE — Telephone Encounter (Signed)
This RN contacted [facility] and spoke with pt's nurse regarding INR and coumadin dose.  She states they are monitoring in house while pt is there and triturating her coumadin with pt presently taking 5 mg a day.  This RN discussed pending urology procedure on 9/12 with need to hold coumadin on 9/9 for 5 days.

## 2021-01-28 ENCOUNTER — Encounter (HOSPITAL_COMMUNITY): Payer: Self-pay

## 2021-01-28 ENCOUNTER — Encounter (HOSPITAL_COMMUNITY)
Admission: RE | Admit: 2021-01-28 | Discharge: 2021-01-28 | Disposition: A | Discharge status: Home / Self Care | Payer: Medicare Other | Source: Ambulatory Visit | Attending: Urology | Admitting: Urology

## 2021-01-28 ENCOUNTER — Other Ambulatory Visit: Payer: Self-pay

## 2021-01-28 DIAGNOSIS — Z01818 Encounter for other preprocedural examination: Secondary | ICD-10-CM | POA: Insufficient documentation

## 2021-01-28 DIAGNOSIS — I82409 Acute embolism and thrombosis of unspecified deep veins of unspecified lower extremity: Secondary | ICD-10-CM | POA: Diagnosis not present

## 2021-01-28 DIAGNOSIS — F419 Anxiety disorder, unspecified: Secondary | ICD-10-CM | POA: Diagnosis not present

## 2021-01-28 DIAGNOSIS — N390 Urinary tract infection, site not specified: Secondary | ICD-10-CM | POA: Diagnosis not present

## 2021-01-28 DIAGNOSIS — I10 Essential (primary) hypertension: Secondary | ICD-10-CM | POA: Diagnosis not present

## 2021-01-28 HISTORY — DX: Other specified postprocedural states: Z98.890

## 2021-01-28 HISTORY — DX: Other specified postprocedural states: R11.2

## 2021-02-03 DIAGNOSIS — F419 Anxiety disorder, unspecified: Secondary | ICD-10-CM | POA: Diagnosis not present

## 2021-02-03 DIAGNOSIS — I10 Essential (primary) hypertension: Secondary | ICD-10-CM | POA: Diagnosis not present

## 2021-02-03 DIAGNOSIS — Z7901 Long term (current) use of anticoagulants: Secondary | ICD-10-CM | POA: Diagnosis not present

## 2021-02-03 DIAGNOSIS — I82409 Acute embolism and thrombosis of unspecified deep veins of unspecified lower extremity: Secondary | ICD-10-CM | POA: Diagnosis not present

## 2021-02-05 DIAGNOSIS — M6281 Muscle weakness (generalized): Secondary | ICD-10-CM | POA: Diagnosis not present

## 2021-02-05 DIAGNOSIS — F419 Anxiety disorder, unspecified: Secondary | ICD-10-CM | POA: Diagnosis not present

## 2021-02-05 DIAGNOSIS — N390 Urinary tract infection, site not specified: Secondary | ICD-10-CM | POA: Diagnosis not present

## 2021-02-05 DIAGNOSIS — T148XXD Other injury of unspecified body region, subsequent encounter: Secondary | ICD-10-CM | POA: Diagnosis not present

## 2021-02-05 DIAGNOSIS — I10 Essential (primary) hypertension: Secondary | ICD-10-CM | POA: Diagnosis not present

## 2021-02-05 DIAGNOSIS — Z7901 Long term (current) use of anticoagulants: Secondary | ICD-10-CM | POA: Diagnosis not present

## 2021-02-07 NOTE — Anesthesia Preprocedure Evaluation (Addendum)
Anesthesia Evaluation  Patient identified by MRN, date of birth, ID band Patient awake    Reviewed: Allergy & Precautions, NPO status , Patient's Chart, lab work & pertinent test results  History of Anesthesia Complications (+) PONV  Airway Mallampati: II  TM Distance: >3 FB Neck ROM: Full    Dental no notable dental hx. (+) Missing, Dental Advisory Given,    Pulmonary neg pulmonary ROS,    Pulmonary exam normal breath sounds clear to auscultation       Cardiovascular hypertension, Pt. on medications Normal cardiovascular exam Rhythm:Regular Rate:Normal  12/2016 Echo  Left ventricle: The cavity size was normal. Wall thickness was  increased in a pattern of mild LVH. Systolic function was  vigorous. The estimated ejection fraction was in the range of 65%  to 70%. Although no diagnostic regional wall motion abnormality  was identified, this possibility cannot be completely excluded on  the basis of this study. Doppler parameters are consistent with  abnormal left ventricular relaxation (grade 1 diastolic  dysfunction).  - Mitral valve: Mildly calcified annulus.  - Right ventricle: Poorly visualized. The cavity size was normal.  Systolic function was normal.  - Inferior vena cava: The vessel was normal in size. The  respirophasic diameter changes were in the normal range (= 50%),  consistent with normal central venous pressure.    Neuro/Psych Anxiety negative neurological ROS  negative psych ROS   GI/Hepatic negative GI ROS, Neg liver ROS,   Endo/Other  Hypothyroidism   Renal/GU Renal diseaseLab Results      Component                Value               Date                      CREATININE               0.80                01/21/2021                BUN                      14                  01/21/2021                NA                       140                 01/21/2021                K                         4.0                 01/21/2021                CL                       106                 01/21/2021                CO2  27                  01/21/2021                Musculoskeletal  (+) Arthritis ,   Abdominal   Peds  Hematology Lab Results      Component                Value               Date                      WBC                      6.8                 01/21/2021                HGB                      11.6 (L)            01/21/2021                HCT                      36.4                01/21/2021                MCV                      89.9                01/21/2021                PLT                      279                 01/21/2021              Anesthesia Other Findings   Reproductive/Obstetrics                            Anesthesia Physical Anesthesia Plan  ASA: 3  Anesthesia Plan: General   Post-op Pain Management:    Induction: Intravenous  PONV Risk Score and Plan: 4 or greater and Treatment may vary due to age or medical condition and Ondansetron  Airway Management Planned: LMA  Additional Equipment: None  Intra-op Plan:   Post-operative Plan:   Informed Consent: I have reviewed the patients History and Physical, chart, labs and discussed the procedure including the risks, benefits and alternatives for the proposed anesthesia with the patient or authorized representative who has indicated his/her understanding and acceptance.     Dental advisory given  Plan Discussed with: CRNA  Anesthesia Plan Comments:        Anesthesia Quick Evaluation

## 2021-02-08 ENCOUNTER — Ambulatory Visit (HOSPITAL_COMMUNITY): Payer: Medicare Other | Admitting: Anesthesiology

## 2021-02-08 ENCOUNTER — Ambulatory Visit (HOSPITAL_COMMUNITY)
Admission: RE | Admit: 2021-02-08 | Discharge: 2021-02-08 | Disposition: A | Discharge status: Skilled Nursing Facility | Payer: Medicare Other | Attending: Urology | Admitting: Urology

## 2021-02-08 ENCOUNTER — Ambulatory Visit (HOSPITAL_COMMUNITY): Payer: Medicare Other | Admitting: Physician Assistant

## 2021-02-08 ENCOUNTER — Other Ambulatory Visit: Payer: Self-pay

## 2021-02-08 ENCOUNTER — Ambulatory Visit (HOSPITAL_COMMUNITY): Payer: Medicare Other

## 2021-02-08 ENCOUNTER — Encounter (HOSPITAL_COMMUNITY): Payer: Self-pay | Admitting: Urology

## 2021-02-08 ENCOUNTER — Encounter (HOSPITAL_COMMUNITY)
Admission: RE | Disposition: A | Discharge status: Skilled Nursing Facility | Payer: Self-pay | Source: Home / Self Care | Attending: Urology

## 2021-02-08 DIAGNOSIS — E213 Hyperparathyroidism, unspecified: Secondary | ICD-10-CM | POA: Diagnosis not present

## 2021-02-08 DIAGNOSIS — R110 Nausea: Secondary | ICD-10-CM | POA: Diagnosis not present

## 2021-02-08 DIAGNOSIS — N132 Hydronephrosis with renal and ureteral calculous obstruction: Secondary | ICD-10-CM | POA: Diagnosis not present

## 2021-02-08 DIAGNOSIS — Z86718 Personal history of other venous thrombosis and embolism: Secondary | ICD-10-CM | POA: Insufficient documentation

## 2021-02-08 DIAGNOSIS — E039 Hypothyroidism, unspecified: Secondary | ICD-10-CM | POA: Insufficient documentation

## 2021-02-08 DIAGNOSIS — Z884 Allergy status to anesthetic agent status: Secondary | ICD-10-CM | POA: Diagnosis not present

## 2021-02-08 DIAGNOSIS — Z7901 Long term (current) use of anticoagulants: Secondary | ICD-10-CM | POA: Insufficient documentation

## 2021-02-08 DIAGNOSIS — Z466 Encounter for fitting and adjustment of urinary device: Secondary | ICD-10-CM | POA: Diagnosis not present

## 2021-02-08 DIAGNOSIS — Z91040 Latex allergy status: Secondary | ICD-10-CM | POA: Diagnosis not present

## 2021-02-08 DIAGNOSIS — I10 Essential (primary) hypertension: Secondary | ICD-10-CM | POA: Diagnosis not present

## 2021-02-08 DIAGNOSIS — Z888 Allergy status to other drugs, medicaments and biological substances status: Secondary | ICD-10-CM | POA: Insufficient documentation

## 2021-02-08 DIAGNOSIS — Z882 Allergy status to sulfonamides status: Secondary | ICD-10-CM | POA: Insufficient documentation

## 2021-02-08 DIAGNOSIS — Z853 Personal history of malignant neoplasm of breast: Secondary | ICD-10-CM | POA: Diagnosis not present

## 2021-02-08 DIAGNOSIS — N202 Calculus of kidney with calculus of ureter: Secondary | ICD-10-CM | POA: Diagnosis not present

## 2021-02-08 DIAGNOSIS — F419 Anxiety disorder, unspecified: Secondary | ICD-10-CM | POA: Diagnosis not present

## 2021-02-08 HISTORY — PX: CYSTOSCOPY/URETEROSCOPY/HOLMIUM LASER/STENT PLACEMENT: SHX6546

## 2021-02-08 LAB — PROTIME-INR
INR: 1 (ref 0.8–1.2)
Prothrombin Time: 13.3 seconds (ref 11.4–15.2)

## 2021-02-08 LAB — APTT: aPTT: 31 seconds (ref 24–36)

## 2021-02-08 SURGERY — CYSTOSCOPY/URETEROSCOPY/HOLMIUM LASER/STENT PLACEMENT
Anesthesia: General | Laterality: Right

## 2021-02-08 MED ORDER — PHENYLEPHRINE 40 MCG/ML (10ML) SYRINGE FOR IV PUSH (FOR BLOOD PRESSURE SUPPORT)
PREFILLED_SYRINGE | INTRAVENOUS | Status: AC
  Filled 2021-02-08: qty 10

## 2021-02-08 MED ORDER — LIDOCAINE HCL (CARDIAC) PF 100 MG/5ML IV SOSY
PREFILLED_SYRINGE | INTRAVENOUS | Status: DC | PRN
  Administered 2021-02-08: 60 mg via INTRAVENOUS

## 2021-02-08 MED ORDER — ONDANSETRON HCL 4 MG/2ML IJ SOLN: 4.0000 mg | Freq: Once | INTRAMUSCULAR | Status: DC | PRN

## 2021-02-08 MED ORDER — ACETAMINOPHEN 10 MG/ML IV SOLN
1000.0000 mg | Freq: Once | INTRAVENOUS | Status: DC | PRN
  Administered 2021-02-08: 1000 mg via INTRAVENOUS

## 2021-02-08 MED ORDER — FENTANYL CITRATE (PF) 100 MCG/2ML IJ SOLN
INTRAMUSCULAR | Status: DC | PRN
  Administered 2021-02-08: 25 ug via INTRAVENOUS
  Administered 2021-02-08: 12.5 ug via INTRAVENOUS
  Administered 2021-02-08 (×2): 25 ug via INTRAVENOUS
  Administered 2021-02-08: 12.5 ug via INTRAVENOUS

## 2021-02-08 MED ORDER — PHENYLEPHRINE HCL (PRESSORS) 10 MG/ML IV SOLN
INTRAVENOUS | Status: DC | PRN
  Administered 2021-02-08 (×2): 40 ug via INTRAVENOUS

## 2021-02-08 MED ORDER — IOHEXOL 300 MG/ML  SOLN
INTRAMUSCULAR | Status: DC | PRN
  Administered 2021-02-08: 10 mL

## 2021-02-08 MED ORDER — DEXAMETHASONE SODIUM PHOSPHATE 10 MG/ML IJ SOLN
INTRAMUSCULAR | Status: AC
  Filled 2021-02-08: qty 1

## 2021-02-08 MED ORDER — FENTANYL CITRATE PF 50 MCG/ML IJ SOSY
PREFILLED_SYRINGE | INTRAMUSCULAR | Status: AC
  Filled 2021-02-08: qty 3

## 2021-02-08 MED ORDER — LACTATED RINGERS IV SOLN: INTRAVENOUS | Status: DC

## 2021-02-08 MED ORDER — PROPOFOL 10 MG/ML IV BOLUS
INTRAVENOUS | Status: AC
  Filled 2021-02-08: qty 20

## 2021-02-08 MED ORDER — 0.9 % SODIUM CHLORIDE (POUR BTL) OPTIME
TOPICAL | Status: DC | PRN
  Administered 2021-02-08: 1000 mL

## 2021-02-08 MED ORDER — ONDANSETRON HCL 4 MG/2ML IJ SOLN
INTRAMUSCULAR | Status: DC | PRN
  Administered 2021-02-08: 4 mg via INTRAVENOUS

## 2021-02-08 MED ORDER — EPHEDRINE 5 MG/ML INJ
INTRAVENOUS | Status: AC
  Filled 2021-02-08: qty 5

## 2021-02-08 MED ORDER — CHLORHEXIDINE GLUCONATE 0.12 % MT SOLN
15.0000 mL | Freq: Once | OROMUCOSAL | Status: AC
  Administered 2021-02-08: 15 mL via OROMUCOSAL

## 2021-02-08 MED ORDER — CIPROFLOXACIN IN D5W 400 MG/200ML IV SOLN
400.0000 mg | Freq: Once | INTRAVENOUS | Status: AC
  Administered 2021-02-08: 400 mg via INTRAVENOUS
  Filled 2021-02-08: qty 200

## 2021-02-08 MED ORDER — LIDOCAINE 2% (20 MG/ML) 5 ML SYRINGE
INTRAMUSCULAR | Status: AC
  Filled 2021-02-08: qty 5

## 2021-02-08 MED ORDER — DEXAMETHASONE SODIUM PHOSPHATE 4 MG/ML IJ SOLN
INTRAMUSCULAR | Status: DC | PRN
  Administered 2021-02-08: 5 mg via INTRAVENOUS

## 2021-02-08 MED ORDER — SODIUM CHLORIDE 0.9 % IR SOLN
Status: DC | PRN
  Administered 2021-02-08: 3000 mL

## 2021-02-08 MED ORDER — ONDANSETRON HCL 4 MG/2ML IJ SOLN
INTRAMUSCULAR | Status: AC
  Filled 2021-02-08: qty 2

## 2021-02-08 MED ORDER — ACETAMINOPHEN 10 MG/ML IV SOLN
INTRAVENOUS | Status: AC
  Filled 2021-02-08: qty 100

## 2021-02-08 MED ORDER — FENTANYL CITRATE PF 50 MCG/ML IJ SOSY
25.0000 ug | PREFILLED_SYRINGE | INTRAMUSCULAR | Status: DC | PRN
  Administered 2021-02-08 (×3): 50 ug via INTRAVENOUS

## 2021-02-08 MED ORDER — FENTANYL CITRATE (PF) 100 MCG/2ML IJ SOLN
INTRAMUSCULAR | Status: AC
  Filled 2021-02-08: qty 2

## 2021-02-08 MED ORDER — PROPOFOL 10 MG/ML IV BOLUS
INTRAVENOUS | Status: DC | PRN
  Administered 2021-02-08: 60 mg via INTRAVENOUS
  Administered 2021-02-08 (×2): 20 mg via INTRAVENOUS

## 2021-02-08 MED ORDER — ORAL CARE MOUTH RINSE: 15.0000 mL | Freq: Once | OROMUCOSAL | Status: AC

## 2021-02-08 SURGICAL SUPPLY — 23 items
BAG URO CATCHER STRL LF (MISCELLANEOUS) ×2 IMPLANT
BASKET LASER NITINOL 1.9FR (BASKET) IMPLANT
BASKET ZERO TIP NITINOL 2.4FR (BASKET) IMPLANT
BSKT STON RTRVL 120 1.9FR (BASKET)
BSKT STON RTRVL ZERO TP 2.4FR (BASKET)
CATH INTERMIT  6FR 70CM (CATHETERS) ×1 IMPLANT
CLOTH BEACON ORANGE TIMEOUT ST (SAFETY) ×2 IMPLANT
EXTRACTOR STONE 1.7FRX115CM (UROLOGICAL SUPPLIES) IMPLANT
GLOVE SURG ENC MOIS LTX SZ7.5 (GLOVE) ×2 IMPLANT
GOWN STRL REUS W/TWL XL LVL3 (GOWN DISPOSABLE) ×2 IMPLANT
GUIDEWIRE ANG ZIPWIRE 038X150 (WIRE) IMPLANT
GUIDEWIRE STR DUAL SENSOR (WIRE) ×3 IMPLANT
KIT TURNOVER KIT A (KITS) ×2 IMPLANT
LASER FIB FLEXIVA PULSE ID 365 (Laser) IMPLANT
MANIFOLD NEPTUNE II (INSTRUMENTS) ×2 IMPLANT
PACK CYSTO (CUSTOM PROCEDURE TRAY) ×2 IMPLANT
SHEATH URETERAL 12FRX28CM (UROLOGICAL SUPPLIES) IMPLANT
SHEATH URETERAL 12FRX35CM (MISCELLANEOUS) ×1 IMPLANT
STENT URET 6FRX24 CONTOUR (STENTS) ×1 IMPLANT
TRACTIP FLEXIVA PULS ID 200XHI (Laser) IMPLANT
TRACTIP FLEXIVA PULSE ID 200 (Laser) ×2
TUBING CONNECTING 10 (TUBING) ×2 IMPLANT
TUBING UROLOGY SET (TUBING) ×2 IMPLANT

## 2021-02-08 NOTE — Anesthesia Procedure Notes (Signed)
Procedure Name: LMA Insertion Date/Time: 02/08/2021 11:24 AM Performed by: Justice Rocher, CRNA Pre-anesthesia Checklist: Patient identified, Emergency Drugs available, Suction available, Patient being monitored and Timeout performed Patient Re-evaluated:Patient Re-evaluated prior to induction Oxygen Delivery Method: Circle system utilized Preoxygenation: Pre-oxygenation with 100% oxygen Induction Type: IV induction Ventilation: Mask ventilation without difficulty LMA: LMA inserted LMA Size: 4.0 Number of attempts: 1 Airway Equipment and Method: Bite block Placement Confirmation: positive ETCO2, breath sounds checked- equal and bilateral and CO2 detector Tube secured with: Tape Dental Injury: Teeth and Oropharynx as per pre-operative assessment

## 2021-02-08 NOTE — Interval H&P Note (Signed)
History and Physical Interval Note:  02/08/2021 7:43 AM  Alicia Vasquez  has presented today for surgery, with the diagnosis of RIGHT RENAL AND URETERAL STONE.  The various methods of treatment have been discussed with the patient and family. After consideration of risks, benefits and other options for treatment, the patient has consented to  Procedure(s): CYSTOSCOPY RIGHT URETEROSCOPY/HOLMIUM LASER/STENT PLACEMENT (Right) as a surgical intervention.  The patient's history has been reviewed, patient examined, no change in status, stable for surgery.  I have reviewed the patient's chart and labs.  Questions were answered to the patient's satisfaction.     Marton Redwood, III

## 2021-02-08 NOTE — H&P (Signed)
CC/HPI: 01/20/2021: Seen today for hospital f/u.   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 Patient states she was recently treated for urinary tract infection with antibiotics. For 2 days prior patient states he has been feeling weak and had episodes of nausea vomiting aspirin and poor appetite. When she tried to get out of her wheelchair to use a walker she tripped and fell and hit her head. Did not lose consciousness. Was brought to the ER. Denies chest pain or shortness of breath. Has been feeling generally weak.     ED Course: In the ER CT head C-spine x-rays of the chest and x-ray pelvis was unremarkable. Lab work showed acute renal failure with creatinine worsening from being normal in July 28 about 10 days ago it is around 1.7 now. Calcium has been consistently high due to hyperparathyroidism. WBC count is 16,000 UA is consistent with UTI. Urine culture on July 20 is growing Proteus mirabilis which is sensitive to ceftriaxone. Patient was given fluid bolus and started on ceftriaxone. Admitted for further management of acute renal failure and UTI.   CT imaging of the abdomen and pelvis noted acute obstructive uropathy on the right with a 6 mm proximal right ureteral calculus just distal to the UPJ. Superimposed bulky  11 mm right intrarenal calculus. She underwent urgent ureteral stent placement with the on-call urologist on 08/11.   Patient discharged on 08/16. Continued on cefdinir 300 mg b.i.d. for an additional week. Acute renal failure improved with rehydration and ureteral stent placement. Creatinine 0.85 at time of hospital discharge.   Pt now in SNF. She is scheduled for URS with Dr Gloriann Loan on 09/12.   She has a prior hx of UTi's but no prior hx of stones, denies hx of GU cancer. She is on anticoagulation therapy with Coumadin. Her INR  remains below 2. Coumadin was increased earlier this week in regards to this. She is scheduled for additional metastatic breast cancer treatment tomorrow at the [facility].   She is having intermittent pain and discomfort but this is moderately controlled with p.r.n. use of Vicodin. She has had an expected increase in baseline lower urinary tract symptoms with indwelling stent endorsing increased frequency/urgency as well as mixed incontinence. Denies dysuria or gross hematuria. She remains afebrile. She has had some nausea but no vomiting.     ALLERGIES:  Anesthetics, Amide  Benadryl Carbocaine Diphenhydramine Epinephrine Latex Sulfa Tramadol Hcl    MEDICATIONS: Warfarin Sodium 3 mg tablet  Cefdinir 300 mg capsule  Hydrocodone-Acetaminophen 5 mg-325 mg tablet  Lotrisone  Nystatin  Synthroid 125 mcg tablet  Vitamin D2 1,250 mcg (50,000 unit) capsule     GU PSH: Cystoscopy Insert Stent, Right - 01/07/2021     NON-GU PSH: Breast lumpectomy, Right - 2015 Hip Replacement, Left - 2018     GU PMH: Kidney Failure, acute, Unspec    NON-GU PMH: Anxiety Arthritis Breast Cancer, History Depression, unspecified DVT, History Hypertension Hypothyroidism Long term (current) use of anticoagulants Lymphedema, not elsewhere classified Malignant neoplasm of upper-outer quadrant of right female breast Other seasonal allergic rhinitis Pain in left knee Vitamin D deficiency, unspecified    FAMILY HISTORY: Family History Unknown    SOCIAL HISTORY: Marital Status: Widowed Preferred Language: English; Race: White Current Smoking Status: Patient has never smoked.   Tobacco Use Assessment Completed: Used Tobacco in last 30 days? Does not  use smokeless tobacco. Has never drank.  Drinks 2 caffeinated drinks per day.    REVIEW OF SYSTEMS:    GU Review Female:   Patient reports frequent urination, hard to postpone urination, get up at night to urinate, leakage of urine, and stream  starts and stops. Patient denies burning /pain with urination, trouble starting your stream, have to strain to urinate, and being pregnant.  Gastrointestinal (Upper):   Patient reports nausea. Patient denies vomiting and indigestion/ heartburn.  Gastrointestinal (Lower):   Patient reports constipation. Patient denies diarrhea.  Constitutional:   Patient reports night sweats and fatigue. Patient denies fever and weight loss.  Skin:   Patient reports itching. Patient denies skin rash/ lesion.  Eyes:   Patient denies blurred vision and double vision.  Ears/ Nose/ Throat:   Patient denies sore throat and sinus problems.  Hematologic/Lymphatic:   Patient reports easy bruising. Patient denies swollen glands.  Cardiovascular:   Patient reports leg swelling and chest pains.   Respiratory:   Patient reports shortness of breath. Patient denies cough.  Endocrine:   Patient denies excessive thirst.  Musculoskeletal:   Patient reports back pain and joint pain.   Neurological:   Patient reports headaches. Patient denies dizziness.  Psychologic:   Patient denies depression and anxiety.   VITAL SIGNS:      01/20/2021 02:30 PM  Weight 223 lb / 101.15 kg  Height 64 in / 162.56 cm  BP 153/82 mmHg  Pulse 86 /min  Temperature 98.0 F / 36.6 C  BMI 38.3 kg/m   MULTI-SYSTEM PHYSICAL EXAMINATION:    Constitutional: Obese. No physical deformities. Normally developed. Good grooming. Pt in wheelchair.  Neck: Neck symmetrical, not swollen. Normal tracheal position.  Respiratory: No labored breathing, no use of accessory muscles.   Cardiovascular: Normal temperature, normal extremity pulses, no swelling, no varicosities.  Skin: No paleness, no jaundice, no cyanosis. No lesion, no ulcer, no rash.  Neurologic / Psychiatric: Oriented to time, oriented to place, oriented to person. No depression, no anxiety, no agitation.  Gastrointestinal: Obese abdomen. No mass, no tenderness, no rigidity.   Musculoskeletal: Normal  gait and station of head and neck.     Complexity of Data:  Source Of History:  Patient, Family/Caregiver, Medical Record Summary  Lab Test Review:   CBC with Diff, CMP  Records Review:   Previous Doctor Records, Previous Hospital Records  Urine Test Review:   Urinalysis, Urine Culture  X-Ray Review: C.T. Abdomen/Pelvis: Reviewed Films. Reviewed Report.    Notes:                     CLINICAL DATA: 80 year old female status post fall yesterday.  Weakness. Acute renal failure.     EXAM:  CT ABDOMEN AND PELVIS WITHOUT CONTRAST     TECHNIQUE:  Multidetector CT imaging of the abdomen and pelvis was performed  following the standard protocol without IV contrast.     COMPARISON: CT Abdomen and Pelvis 01/15/2017. Chest CT 03/18/2019.     FINDINGS:  Lower chest: Respiratory motion and lower lung volumes. Chronic  benign calcified granuloma in the right costophrenic angle with  associated mild atelectasis or scarring today. No pericardial or  pleural effusion.     Hepatobiliary: Negative noncontrast liver and gallbladder.     Pancreas: Mild pancreatic atrophy.     Spleen: Negative; small chronic calcified granulomas.     Adrenals/Urinary Tract: Adrenal glands remain within normal limits.  Negative noncontrast left kidney and left ureter.  Moderate to severe right hydronephrosis with right nephromegaly and  acute perinephric stranding. Bulky 11 mm intrarenal calculus.  Obstructing 6 mm calculus in the proximal right ureter just distal  to the ureteropelvic junction (coronal image 82). Distal to that  stone the right ureter is decompressed. There is continued  periureteral stranding toward the pelvis. Both distal ureters appear  decompressed and normal. Diminutive urinary bladder. Incidental  pelvic phleboliths.     Stomach/Bowel: Redundant large bowel with diverticulosis. No large  bowel inflammation. Normal appendix on series 2, image 64. Negative  terminal ileum. No dilated  small bowel. Moderate gastric hiatal  hernia. Otherwise negative stomach and duodenum. No free air. No  abdominal free fluid.     Vascular/Lymphatic: Aortoiliac calcified atherosclerosis. Normal  caliber abdominal aorta. Vascular patency is not evaluated in the  absence of IV contrast. No lymphadenopathy.     Reproductive: Negative noncontrast appearance.     Other: Mild presacral stranding is new. No pelvic free fluid.     Musculoskeletal: Osteopenia. No destructive osseous lesion  identified. However, T12 compression fracture has progressed since  2020, now moderate to severe (series 5, image 105. Furthermore, mild  compression of the T11 vertebral body is new since 2020. Mild  compression of the L4 superior endplate is new since QA348G. No  significant retropulsion at these levels. Left hip arthroplasty is  new since 2018.     IMPRESSION:  1. Acute obstructive uropathy on the right with a 6 mm proximal  right ureteral calculus just distal to the UPJ. Superimposed bulky  11 mm right intrarenal calculus.  2. Osteopenia with new and progressed lower thoracic and lumbar  compression fractures since 2020: T11, T12, and L4.  If specific therapy such as vertebroplasty is desired, Lumbar MRI or  Nuclear Medicine Whole-body Bone Scan would best determine acuity.  3. No other acute or inflammatory process identified in the  noncontrast abdomen or pelvis.  4. Aortic Atherosclerosis (ICD10-I70.0).        Electronically Signed  By: Genevie Ann M.D.  On: 01/05/2021 10:30   PROCEDURES:          Urinalysis w/Scope - 81001 Dipstick Dipstick Cont'd Micro  Color: Yellow Bilirubin: Neg WBC/hpf: 10 - 20/hpf  Appearance: Cloudy Ketones: Neg RBC/hpf: 20 - 40/hpf  Specific Gravity: 1.020 Blood: 3+ Bacteria: NS (Not Seen)  pH: 6.5 Protein: Trace Cystals: NS (Not Seen)  Glucose: Neg Urobilinogen: 0.2 Casts: Granular    Nitrites: Neg Trichomonas: Not Present    Leukocyte Esterase: 1+ Mucous: Present       Epithelial Cells: 6 - 10/hpf      Yeast: NS (Not Seen)      Sperm: Not Present    Notes:      ASSESSMENT:      ICD-10 Details  1 GU:   Renal and ureteral calculus - N20.2 Acute, Systemic Symptoms  2   Acute Cystitis/UTI - N30.00 Acute, Systemic Symptoms   PLAN:           Orders Labs Urine Culture          Schedule Return Visit/Planned Activity: Keep Scheduled Appointment - Schedule Surgery, Follow up MD          Document Letter(s):  Created for Patient: Clinical Summary         Notes:   Specimen for UA and culture obtained today. I will check with Dr. Gloriann Loan about continuing antimicrobial therapy until time of her ureteroscopy on 9/12. She is scheduled to  complete cefdinir later this week. Clearance is pending as she remains on coumadin. Her INR has been variable with recent coumadin increase this week. All questions answered to the best of my ability regarding upcoming procedure and expected postoperative course with understanding expressed by the patient.        Next Appointment:      Next Appointment: 02/08/2021 10:00 AM    Appointment Type: Surgery     Location: [facility] [phone]    Provider: Link Snuffer, III, M.D.    Reason for Visit: OP WL CYSTO RT URS LL STENT      Signed by Jiles Crocker, NP on 01/20/21 at 4:29 PM (EDT

## 2021-02-08 NOTE — Op Note (Signed)
Operative Note  Preoperative diagnosis:  1.  Right renal and ureteral calculi  Postoperative diagnosis: 1.  Right renal and ureteral calculi  Procedure(s): 1.  Cystoscopy with right retrograde pyelogram, right ureteroscopy with laser lithotripsy, ureteral stent exchange  Surgeon: Link Snuffer, MD  Assistants: None  Anesthesia: General  Complications: None immediate  EBL: Minimal  Specimens: 1.  None  Drains/Catheters: 1.  6 x 24 double-J ureteral stent with a string  Intraoperative findings: 1.  Normal urethra and bladder 2.  Previous right ureteral stent had been retropulsed into the kidney.  There were 2 renal calculi that were laser fragmented on dust settings to tiny fragments.  Retrograde pyelogram revealed no filling defect or hydronephrosis at the end of the case.  Indication: 80 year old female status post urgent ureteral stent placement for an obstructing right ureteral calculus and sepsis.  She presents for definitive management of her stone.  Description of procedure:  The patient was identified and consent was obtained.  The patient was taken to the operating room and placed in the supine position.  The patient was placed under general anesthesia.  Perioperative antibiotics were administered.  The patient was placed in dorsal lithotomy.  Patient was prepped and draped in a standard sterile fashion and a timeout was performed.  A 21 French rigid cystoscope was advanced into the urethra and into the bladder.  Complete cystoscopy was performed with no abnormal findings.  The right ureteral stent was grasped and pulled just beyond the meatus.  A sensor wire was advanced through this up to the kidney under fluoroscopic guidance.  The stent was withdrawn.  Semirigid ureteroscopy was performed up to the level of the renal pelvis.  No ureteral calculi were seen.  I passed a sensor wire through the scope and into the kidney under fluoroscopic guidance and withdrew the scope.  I  used this wire to advance a 12 x 14 ureteral access sheath over the wire under continuous fluoroscopic guidance.  Digital ureteroscopy identified the stones of interest which were laser fragmented on dust settings.  Once there were no clinically significant stone fragments I shot a retrograde pyelogram through the scope with the findings noted above.  I then withdrew the scope along with the access sheath visualizing the entire ureter upon removal.  There were no ureteral calculi and no obvious ureteral injury.  The ureter was wide open.  Therefore I advanced a stent over the wire under fluoroscopic guidance followed by removal of the wire.  Fluoroscopy confirmed proximal and distal placement.  A string was left on the stent and the string was inserted into the vagina.  This concluded the operation.  Patient tolerated the procedure well and was stable postoperative.  Plan: She May remove her stent at the facility on Thursday morning.

## 2021-02-08 NOTE — Anesthesia Postprocedure Evaluation (Signed)
Anesthesia Post Note  Patient: Alicia Vasquez  Procedure(s) Performed: CYSTOSCOPY, RIGHT URETEROSCOPY, RIGHT RETRGRADE PYELOGRAM, HOLMIUM LASER/STENT PLACEMENT (Right)     Patient location during evaluation: PACU Anesthesia Type: General Level of consciousness: awake and alert Pain management: pain level controlled Vital Signs Assessment: post-procedure vital signs reviewed and stable Respiratory status: spontaneous breathing, nonlabored ventilation, respiratory function stable and patient connected to nasal cannula oxygen Cardiovascular status: blood pressure returned to baseline and stable Postop Assessment: no apparent nausea or vomiting Anesthetic complications: no   No notable events documented.  Last Vitals:  Vitals:   02/08/21 1145 02/08/21 1204  BP: (!) 159/76 (!) 160/75  Pulse: 85 87  Resp: (!) 22 16  Temp:    SpO2: 97% 92%    Last Pain:  Vitals:   02/08/21 0806  TempSrc: Oral  PainSc: 0-No pain                 Barnet Glasgow

## 2021-02-08 NOTE — Transfer of Care (Signed)
Immediate Anesthesia Transfer of Care Note  Patient: Alicia Vasquez  Procedure(s) Performed: Procedure(s) (LRB): CYSTOSCOPY, RIGHT URETEROSCOPY, RIGHT RETRGRADE PYELOGRAM, HOLMIUM LASER/STENT PLACEMENT (Right)  Patient Location: PACU  Anesthesia Type: General  Level of Consciousness: awake, sedated, patient cooperative and responds to stimulation  Airway & Oxygen Therapy: Patient Spontanous Breathing and Patient connected to face mask oxygen  Post-op Assessment: Report given to PACU RN, Post -op Vital signs reviewed and stable and Patient moving all extremities  Post vital signs: Reviewed and stable  Complications: No apparent anesthesia complications

## 2021-02-08 NOTE — Discharge Instructions (Signed)
[facility] [phone] Post Ureteroscopy With or Without Stent Instructions  Your ureteral stent has a string on it.  Remove the stent on Thursday morning by gently pulling the string.  The stent is about 1 foot long.  Definitions:  Ureter: The duct that transports urine from the kidney to the bladder. Stent:   A plastic hollow tube that is placed into the ureter, from the kidney to the                 bladder to prevent the ureter from swelling shut.  GENERAL INSTRUCTIONS:  Despite the fact that no skin incisions were used, the area around the ureter and bladder is raw and irritated. The stent is a foreign body which will further irritate the bladder wall. This irritation is manifested by increased frequency of urination, both day and night, and by an increase in the urge to urinate. In some, the urge to urinate is present almost always. Sometimes the urge is strong enough that you may not be able to stop yourself from urinating. The only real cure is to remove the stent and then give time for the bladder wall to heal which can't be done until the danger of the ureter swelling shut has passed, which varies.  You may see some blood in your urine while the stent is in place and a few days afterwards. Do not be alarmed, even if the urine was clear for a while. Get off your feet and drink lots of fluids until clearing occurs. If you start to pass clots or don't improve, call us.  DIET: You may return to your normal diet immediately. Because of the raw surface of your bladder, alcohol, spicy foods, acid type foods and drinks with caffeine may cause irritation or frequency and should be used in moderation. To keep your urine flowing freely and to avoid constipation, drink plenty of fluids during the day ( 8-10 glasses ). Tip: Avoid cranberry juice because it is very acidic.  ACTIVITY: Your physical activity doesn't need to be restricted. However, if you are very active, you may  see some blood in your urine. We suggest that you reduce your activity under these circumstances until the bleeding has stopped.  BOWELS: It is important to keep your bowels regular during the postoperative period. Straining with bowel movements can cause bleeding. A bowel movement every other day is reasonable. Use a mild laxative if needed, such as Milk of Magnesia 2-3 tablespoons, or 2 Dulcolax tablets. Call if you continue to have problems. If you have been taking narcotics for pain, before, during or after your surgery, you may be constipated. Take a laxative if necessary.   MEDICATION: You should resume your pre-surgery medications unless told not to. You may take oxybutynin or flomax if prescribed for bladder spasms or discomfort from the stent Take pain medication as directed for pain refractory to conservative management  PROBLEMS YOU SHOULD REPORT TO [name]: Fevers over 100.5 Fahrenheit. Heavy bleeding, or clots ( See above notes about blood in urine ). Inability to urinate. Drug reactions ( hives, rash, nausea, vomiting, diarrhea ). Severe burning or pain with urination that is not improving.

## 2021-02-09 ENCOUNTER — Encounter (HOSPITAL_COMMUNITY): Payer: Self-pay | Admitting: Urology

## 2021-02-10 DIAGNOSIS — Z7901 Long term (current) use of anticoagulants: Secondary | ICD-10-CM | POA: Diagnosis not present

## 2021-02-10 DIAGNOSIS — M6281 Muscle weakness (generalized): Secondary | ICD-10-CM | POA: Diagnosis not present

## 2021-02-10 DIAGNOSIS — I10 Essential (primary) hypertension: Secondary | ICD-10-CM | POA: Diagnosis not present

## 2021-02-10 DIAGNOSIS — F419 Anxiety disorder, unspecified: Secondary | ICD-10-CM | POA: Diagnosis not present

## 2021-02-15 DIAGNOSIS — N202 Calculus of kidney with calculus of ureter: Secondary | ICD-10-CM | POA: Diagnosis not present

## 2021-02-15 DIAGNOSIS — I10 Essential (primary) hypertension: Secondary | ICD-10-CM | POA: Diagnosis not present

## 2021-02-15 DIAGNOSIS — M6281 Muscle weakness (generalized): Secondary | ICD-10-CM | POA: Diagnosis not present

## 2021-02-15 DIAGNOSIS — I82409 Acute embolism and thrombosis of unspecified deep veins of unspecified lower extremity: Secondary | ICD-10-CM | POA: Diagnosis not present

## 2021-02-15 DIAGNOSIS — Z7901 Long term (current) use of anticoagulants: Secondary | ICD-10-CM | POA: Diagnosis not present

## 2021-02-17 DIAGNOSIS — Z7901 Long term (current) use of anticoagulants: Secondary | ICD-10-CM | POA: Diagnosis not present

## 2021-02-17 DIAGNOSIS — I82409 Acute embolism and thrombosis of unspecified deep veins of unspecified lower extremity: Secondary | ICD-10-CM | POA: Diagnosis not present

## 2021-02-17 DIAGNOSIS — I10 Essential (primary) hypertension: Secondary | ICD-10-CM | POA: Diagnosis not present

## 2021-02-17 DIAGNOSIS — F419 Anxiety disorder, unspecified: Secondary | ICD-10-CM | POA: Diagnosis not present

## 2021-02-17 DIAGNOSIS — M6281 Muscle weakness (generalized): Secondary | ICD-10-CM | POA: Diagnosis not present

## 2021-02-18 ENCOUNTER — Inpatient Hospital Stay: Payer: Medicare Other

## 2021-02-18 ENCOUNTER — Inpatient Hospital Stay: Payer: Medicare Other | Attending: Oncology

## 2021-02-18 ENCOUNTER — Inpatient Hospital Stay: Payer: Medicare Other | Admitting: Adult Health

## 2021-02-18 ENCOUNTER — Telehealth: Payer: Self-pay | Admitting: *Deleted

## 2021-02-18 NOTE — Telephone Encounter (Signed)
patient missed appts today for lab, provider and treatment.Attempted contact. Call went to a "no name" voice mail.  Left general message that she had missed appts and to contact the office with #[phone].

## 2021-02-23 ENCOUNTER — Other Ambulatory Visit: Payer: Self-pay

## 2021-02-23 ENCOUNTER — Ambulatory Visit
Admission: RE | Admit: 2021-02-23 | Discharge: 2021-02-23 | Disposition: A | Discharge status: Home / Self Care | Payer: Medicare Other | Source: Ambulatory Visit | Attending: Oncology | Admitting: Oncology

## 2021-02-23 ENCOUNTER — Ambulatory Visit: Payer: Medicare Other

## 2021-02-23 DIAGNOSIS — F339 Major depressive disorder, recurrent, unspecified: Secondary | ICD-10-CM | POA: Diagnosis not present

## 2021-02-23 DIAGNOSIS — Z170 Estrogen receptor positive status [ER+]: Secondary | ICD-10-CM

## 2021-02-23 DIAGNOSIS — Z1231 Encounter for screening mammogram for malignant neoplasm of breast: Secondary | ICD-10-CM | POA: Diagnosis not present

## 2021-02-23 DIAGNOSIS — F064 Anxiety disorder due to known physiological condition: Secondary | ICD-10-CM | POA: Diagnosis not present

## 2021-02-23 DIAGNOSIS — C50411 Malignant neoplasm of upper-outer quadrant of right female breast: Secondary | ICD-10-CM

## 2021-02-24 DIAGNOSIS — F419 Anxiety disorder, unspecified: Secondary | ICD-10-CM | POA: Diagnosis not present

## 2021-02-24 DIAGNOSIS — I10 Essential (primary) hypertension: Secondary | ICD-10-CM | POA: Diagnosis not present

## 2021-02-24 DIAGNOSIS — Z7901 Long term (current) use of anticoagulants: Secondary | ICD-10-CM | POA: Diagnosis not present

## 2021-02-24 DIAGNOSIS — I82409 Acute embolism and thrombosis of unspecified deep veins of unspecified lower extremity: Secondary | ICD-10-CM | POA: Diagnosis not present

## 2021-03-02 DIAGNOSIS — Z7901 Long term (current) use of anticoagulants: Secondary | ICD-10-CM | POA: Diagnosis not present

## 2021-03-02 DIAGNOSIS — F419 Anxiety disorder, unspecified: Secondary | ICD-10-CM | POA: Diagnosis not present

## 2021-03-02 DIAGNOSIS — I10 Essential (primary) hypertension: Secondary | ICD-10-CM | POA: Diagnosis not present

## 2021-03-02 DIAGNOSIS — M79605 Pain in left leg: Secondary | ICD-10-CM | POA: Diagnosis not present

## 2021-03-03 ENCOUNTER — Other Ambulatory Visit: Payer: Self-pay | Admitting: *Deleted

## 2021-03-03 DIAGNOSIS — M79605 Pain in left leg: Secondary | ICD-10-CM | POA: Diagnosis not present

## 2021-03-03 DIAGNOSIS — F419 Anxiety disorder, unspecified: Secondary | ICD-10-CM | POA: Diagnosis not present

## 2021-03-03 DIAGNOSIS — T148XXD Other injury of unspecified body region, subsequent encounter: Secondary | ICD-10-CM | POA: Diagnosis not present

## 2021-03-03 DIAGNOSIS — Z7901 Long term (current) use of anticoagulants: Secondary | ICD-10-CM | POA: Diagnosis not present

## 2021-03-03 DIAGNOSIS — I10 Essential (primary) hypertension: Secondary | ICD-10-CM | POA: Diagnosis not present

## 2021-03-03 NOTE — Patient Outreach (Signed)
Member screened for potential [facility] Care Management needs. Mrs. Malecki resides in [facility] per [facility].   Confidential voicemail left for [facility] social worker to request call back.   Will continue to follow for potential [facility] Management needs.    [name], MSN, RN,BSN [facility] Coordinator [phone] [facility]) [phone]  (Toll free office)

## 2021-03-08 DIAGNOSIS — I82409 Acute embolism and thrombosis of unspecified deep veins of unspecified lower extremity: Secondary | ICD-10-CM | POA: Diagnosis not present

## 2021-03-08 DIAGNOSIS — Z7901 Long term (current) use of anticoagulants: Secondary | ICD-10-CM | POA: Diagnosis not present

## 2021-03-08 DIAGNOSIS — M79605 Pain in left leg: Secondary | ICD-10-CM | POA: Diagnosis not present

## 2021-03-08 DIAGNOSIS — F419 Anxiety disorder, unspecified: Secondary | ICD-10-CM | POA: Diagnosis not present

## 2021-03-10 DIAGNOSIS — M4856XA Collapsed vertebra, not elsewhere classified, lumbar region, initial encounter for fracture: Secondary | ICD-10-CM | POA: Diagnosis not present

## 2021-03-10 DIAGNOSIS — I82409 Acute embolism and thrombosis of unspecified deep veins of unspecified lower extremity: Secondary | ICD-10-CM | POA: Diagnosis not present

## 2021-03-10 DIAGNOSIS — C50919 Malignant neoplasm of unspecified site of unspecified female breast: Secondary | ICD-10-CM | POA: Diagnosis not present

## 2021-03-12 DIAGNOSIS — I82409 Acute embolism and thrombosis of unspecified deep veins of unspecified lower extremity: Secondary | ICD-10-CM | POA: Diagnosis not present

## 2021-03-12 DIAGNOSIS — M6281 Muscle weakness (generalized): Secondary | ICD-10-CM | POA: Diagnosis not present

## 2021-03-12 DIAGNOSIS — Z7901 Long term (current) use of anticoagulants: Secondary | ICD-10-CM | POA: Diagnosis not present

## 2021-03-12 DIAGNOSIS — F419 Anxiety disorder, unspecified: Secondary | ICD-10-CM | POA: Diagnosis not present

## 2021-03-16 ENCOUNTER — Ambulatory Visit: Payer: Medicare Other | Admitting: Podiatry

## 2021-03-17 ENCOUNTER — Telehealth: Payer: Self-pay | Admitting: *Deleted

## 2021-03-17 DIAGNOSIS — Z7901 Long term (current) use of anticoagulants: Secondary | ICD-10-CM | POA: Diagnosis not present

## 2021-03-17 DIAGNOSIS — F419 Anxiety disorder, unspecified: Secondary | ICD-10-CM | POA: Diagnosis not present

## 2021-03-17 DIAGNOSIS — M6281 Muscle weakness (generalized): Secondary | ICD-10-CM | POA: Diagnosis not present

## 2021-03-17 NOTE — Telephone Encounter (Signed)
VM left  by pt stating she needs to reschedule appts for tomorrow due to having a cold and not feeling well.  This RN sent a high priority scheduling message per above.

## 2021-03-18 ENCOUNTER — Inpatient Hospital Stay: Payer: Medicare Other | Attending: Oncology

## 2021-03-18 ENCOUNTER — Inpatient Hospital Stay: Payer: Medicare Other

## 2021-03-18 ENCOUNTER — Inpatient Hospital Stay: Payer: Medicare Other | Admitting: Pharmacist

## 2021-03-18 ENCOUNTER — Other Ambulatory Visit: Payer: Self-pay

## 2021-03-18 ENCOUNTER — Other Ambulatory Visit: Payer: Self-pay | Admitting: Pharmacist

## 2021-03-18 VITALS — BP 140/60 | HR 85 | Temp 99.5°F | Resp 16 | Ht 64.0 in

## 2021-03-18 DIAGNOSIS — M84659P Pathological fracture in other disease, hip, unspecified, subsequent encounter for fracture with malunion: Secondary | ICD-10-CM

## 2021-03-18 DIAGNOSIS — M899 Disorder of bone, unspecified: Secondary | ICD-10-CM

## 2021-03-18 DIAGNOSIS — Z86718 Personal history of other venous thrombosis and embolism: Secondary | ICD-10-CM | POA: Diagnosis not present

## 2021-03-18 DIAGNOSIS — Z7901 Long term (current) use of anticoagulants: Secondary | ICD-10-CM | POA: Insufficient documentation

## 2021-03-18 DIAGNOSIS — C50411 Malignant neoplasm of upper-outer quadrant of right female breast: Secondary | ICD-10-CM

## 2021-03-18 DIAGNOSIS — C7951 Secondary malignant neoplasm of bone: Secondary | ICD-10-CM

## 2021-03-18 DIAGNOSIS — I82401 Acute embolism and thrombosis of unspecified deep veins of right lower extremity: Secondary | ICD-10-CM

## 2021-03-18 DIAGNOSIS — C50919 Malignant neoplasm of unspecified site of unspecified female breast: Secondary | ICD-10-CM | POA: Diagnosis not present

## 2021-03-18 DIAGNOSIS — Z170 Estrogen receptor positive status [ER+]: Secondary | ICD-10-CM

## 2021-03-18 LAB — CBC WITH DIFFERENTIAL (CANCER CENTER ONLY)
Abs Immature Granulocytes: 0.01 10*3/uL (ref 0.00–0.07)
Basophils Absolute: 0 10*3/uL (ref 0.0–0.1)
Basophils Relative: 1 %
Eosinophils Absolute: 0 10*3/uL (ref 0.0–0.5)
Eosinophils Relative: 0 %
HCT: 39.6 % (ref 36.0–46.0)
Hemoglobin: 12.8 g/dL (ref 12.0–15.0)
Immature Granulocytes: 0 %
Lymphocytes Relative: 14 %
Lymphs Abs: 0.7 10*3/uL (ref 0.7–4.0)
MCH: 28.8 pg (ref 26.0–34.0)
MCHC: 32.3 g/dL (ref 30.0–36.0)
MCV: 89.2 fL (ref 80.0–100.0)
Monocytes Absolute: 1.2 10*3/uL — ABNORMAL HIGH (ref 0.1–1.0)
Monocytes Relative: 24 %
Neutro Abs: 3 10*3/uL (ref 1.7–7.7)
Neutrophils Relative %: 61 %
Platelet Count: 213 10*3/uL (ref 150–400)
RBC: 4.44 MIL/uL (ref 3.87–5.11)
RDW: 15.4 % (ref 11.5–15.5)
WBC Count: 5 10*3/uL (ref 4.0–10.5)
nRBC: 0 % (ref 0.0–0.2)

## 2021-03-18 LAB — CMP (CANCER CENTER ONLY)
ALT: 27 U/L (ref 0–44)
AST: 25 U/L (ref 15–41)
Albumin: 3.8 g/dL (ref 3.5–5.0)
Alkaline Phosphatase: 99 U/L (ref 38–126)
Anion gap: 11 (ref 5–15)
BUN: 12 mg/dL (ref 8–23)
CO2: 24 mmol/L (ref 22–32)
Calcium: 9.9 mg/dL (ref 8.9–10.3)
Chloride: 103 mmol/L (ref 98–111)
Creatinine: 0.84 mg/dL (ref 0.44–1.00)
GFR, Estimated: >60 mL/min (ref >60)
Glucose, Bld: 106 mg/dL — ABNORMAL HIGH (ref 70–99)
Potassium: 3.6 mmol/L (ref 3.5–5.1)
Sodium: 138 mmol/L (ref 135–145)
Total Bilirubin: 0.3 mg/dL (ref 0.3–1.2)
Total Protein: 6.7 g/dL (ref 6.5–8.1)

## 2021-03-18 LAB — PROTIME-INR
INR: 1.4 — ABNORMAL HIGH (ref 0.8–1.2)
Prothrombin Time: 16.8 seconds — ABNORMAL HIGH (ref 11.4–15.2)

## 2021-03-18 MED ORDER — FULVESTRANT 250 MG/5ML IM SOSY
500.0000 mg | PREFILLED_SYRINGE | Freq: Once | INTRAMUSCULAR | Status: AC
  Administered 2021-03-18: 500 mg via INTRAMUSCULAR
  Filled 2021-03-18: qty 10

## 2021-03-18 NOTE — Patient Instructions (Signed)
Fulvestrant injection What is this medication? FULVESTRANT (ful VES trant) blocks the effects of estrogen. It is used to treat breast cancer. This medicine may be used for other purposes; ask your health care provider or pharmacist if you have questions. COMMON BRAND NAME(S): FASLODEX What should I tell my care team before I take this medication? They need to know if you have any of these conditions: bleeding disorders liver disease low blood counts, like low white cell, platelet, or red cell counts an unusual or allergic reaction to fulvestrant, other medicines, foods, dyes, or preservatives pregnant or trying to get pregnant breast-feeding How should I use this medication? This medicine is for injection into a muscle. It is usually given by a health care professional in a hospital or clinic setting. Talk to your pediatrician regarding the use of this medicine in children. Special care may be needed. Overdosage: If you think you have taken too much of this medicine contact a poison control center or emergency room at once. NOTE: This medicine is only for you. Do not share this medicine with others. What if I miss a dose? It is important not to miss your dose. Call your doctor or health care professional if you are unable to keep an appointment. What may interact with this medication? medicines that treat or prevent blood clots like warfarin, enoxaparin, dalteparin, apixaban, dabigatran, and rivaroxaban This list may not describe all possible interactions. Give your health care provider a list of all the medicines, herbs, non-prescription drugs, or dietary supplements you use. Also tell them if you smoke, drink alcohol, or use illegal drugs. Some items may interact with your medicine. What should I watch for while using this medication? Your condition will be monitored carefully while you are receiving this medicine. You will need important blood work done while you are taking this  medicine. Do not become pregnant while taking this medicine or for at least 1 year after stopping it. Women of child-bearing potential will need to have a negative pregnancy test before starting this medicine. Women should inform their doctor if they wish to become pregnant or think they might be pregnant. There is a potential for serious side effects to an unborn child. Men should inform their doctors if they wish to father a child. This medicine may lower sperm counts. Talk to your health care professional or pharmacist for more information. Do not breast-feed an infant while taking this medicine or for 1 year after the last dose. What side effects may I notice from receiving this medication? Side effects that you should report to your doctor or health care professional as soon as possible: allergic reactions like skin rash, itching or hives, swelling of the face, lips, or tongue feeling faint or lightheaded, falls pain, tingling, numbness, or weakness in the legs signs and symptoms of infection like fever or chills; cough; flu-like symptoms; sore throat vaginal bleeding Side effects that usually do not require medical attention (report to your doctor or health care professional if they continue or are bothersome): aches, pains constipation diarrhea headache hot flashes nausea, vomiting pain at site where injected stomach pain This list may not describe all possible side effects. Call your doctor for medical advice about side effects. You may report side effects to FDA at 1-800-FDA-1088. Where should I keep my medication? This drug is given in a hospital or clinic and will not be stored at home. NOTE: This sheet is a summary. It may not cover all possible information. If you have   questions about this medicine, talk to your doctor, pharmacist, or health care provider.  2022 Elsevier/Gold Standard (2017-08-24 11:34:41)  

## 2021-03-18 NOTE — Patient Instructions (Signed)
  Patient Instructions per Dr. Jana Hakim  - Ciprofloxacin 500 mg by mouth every 12 hours for 5 days (Dr. [name] nurse, Val, will fax orders to [facility])  - INR subtherapeutic today at 1.4. [facility] should be checking INR weekly on Mondays per their instructions. They are sending over INR report and current dose to Dr. Jana Hakim today. Likely no adjustments for warfarin  by Dr. Jana Hakim since ciprofloxacin may increase INR when combined with warfarin. [facility] should be adjusting your warfarin dose accordingly while you are staying with them. Physician or APP can contact Dr. Jana Hakim if needed.  - Next appointment with Dr. Jana Hakim is 04/15/21 with fulvestrant injection and labs. Please contact the clinic at [phone] if needed for any side effects, fevers once you are discharged from [facility]  - Warfarin tablet information and vitamin K foods packet provided for reference.

## 2021-03-18 NOTE — Progress Notes (Signed)
[place]       Telephone: [phone]?Fax: [phone]   Oncology Clinical Pharmacist Practitioner Initial Assessment  Alicia Vasquez is a 80 y.o. female with a diagnosis of metastatic breast cancer.  Indication/Regimen Fulvestrant (Faslodex) is being used appropriately for treatment of metastatic breast cancer by Dr. Lurline Del.   Relevant drug-specific markers: CA 27.29 (U/mL)  - 03/18/21: pending  - 01/21/21: 57.8  - 12/24/20: 41.9  - 11/26/20: 44.1   Wt Readings from Last 1 Encounters:  02/08/21 222 lb (100.7 kg)    Estimated body surface area is 2.13 meters squared as calculated from the following:   Height as of this encounter: 5\' 4"  (1.626 m).   Weight as of 02/08/21: 222 lb (100.7 kg).  The dosing regimen is fulvestrant 500 mg by intramuscular route every 28 days. It is planned to continue until disease progression or unacceptable toxicity. Clinical pharmacy was asked to see Alicia Vasquez today who is scheduled for fulvestrant today.  She also takes warfarin.  Currently, Alicia Vasquez is residing at [facility]. She went there shortly after her kidney stone removal per patient which was around 02/08/21. She plans on living with her son who is getting the house ready for her arrival.  Alicia Vasquez was with her friend today, [name], who helps with the discussion since [name] has trouble hearing.  Alicia Vasquez reports a new onset of cough today and her temperature was 99.5 F. Per her report, she was seen by the PA at [facility] and tested for COVID-19 and was repeatedly negative.  Reviewed symptoms with Dr. Jana Hakim and he is comfortable doing a 5 day course if ciprofloxacin 500 mg every 12 hours.  These orders will be faxed over to [facility] by Tivis Ringer, Dr. [name] nurse.  Also discussed with patient her INR level today which was 1.4 today.  She reports that this has been managed by [name] and that a recent INR was 1.8, she  believes it was yesterday.  She did not bring a copy of her INR levels or current warfarin dose from [place].  We have requested these records.  Due to the possible increase in INR from ciprofloxacin/warfarin and [name] managing her warfarin dose currently, Dr. Jana Hakim recommends [name] to continue monitoring INR/warfarin until patient is discharged from their care.  At that time, Dr. Jana Hakim will manage as he has in the past.  Her next appointment and visit with Dr. Jana Hakim is scheduled for 04/15/21.  All of the above instructions were given to the patient and caregiver in written format.  Alicia Vasquez verbalized understanding of the plan and all questions were answered to her satisfaction.   Dose Modifications None   Allergies Allergies  Allergen Reactions   Anesthetics, Amide Hypertension   Benadryl [Diphenhydramine Hcl] Other (See Comments)    Dizziness   Carbocaine [Mepivacaine Hcl] Hypertension   Codeine Other (See Comments)    Dizziness   Epinephrine Hypertension   Sulfa Antibiotics Other (See Comments)    dizziness   Diphenhydramine    Latex Other (See Comments)    Blisters in mouth   Penicillins Rash    Has patient had a PCN reaction causing immediate rash, facial/tongue/throat swelling, SOB or lightheadedness with hypotension: Unknown Has patient had a PCN reaction causing severe rash involving mucus membranes or skin necrosis: Unknown Has patient had a PCN reaction that required hospitalization: Unknown Has patient had a PCN reaction occurring within the last 10 years: Unknown If  all of the above answers are "NO", then may proceed with Cephalosporin use.    Tramadol Other (See Comments)    Sedation.     Laboratory Data CBC EXTENDED Latest Ref Rng & Units 03/18/2021 01/21/2021 01/10/2021  WBC 4.0 - 10.5 K/uL 5.0 6.8 10.8(H)  RBC 3.87 - 5.11 MIL/uL 4.44 4.05 3.79(L)  HGB 12.0 - 15.0 g/dL 12.8 11.6(L) 10.6(L)  HCT 36.0 - 46.0 % 39.6 36.4 33.5(L)  PLT 150 - 400  K/uL 213 279 131(L)  NEUTROABS 1.7 - 7.7 K/uL 3.0 4.4 8.0(H)  LYMPHSABS 0.7 - 4.0 K/uL 0.7 1.3 1.0    CMP Latest Ref Rng & Units 03/18/2021 01/21/2021 01/10/2021  Glucose 70 - 99 mg/dL 106(H) 112(H) 109(H)  BUN 8 - 23 mg/dL 12 14 33(H)  Creatinine 0.44 - 1.00 mg/dL 0.84 0.80 0.85  Sodium 135 - 145 mmol/L 138 140 137  Potassium 3.5 - 5.1 mmol/L 3.6 4.0 3.3(L)  Chloride 98 - 111 mmol/L 103 106 105  CO2 22 - 32 mmol/L 24 27 25   Calcium 8.9 - 10.3 mg/dL 9.9 10.7(H) 10.1  Total Protein 6.5 - 8.1 g/dL 6.7 6.8 -  Total Bilirubin 0.3 - 1.2 mg/dL 0.3 0.5 -  Alkaline Phos 38 - 126 U/L 99 156(H) -  AST 15 - 41 U/L 25 14(L) -  ALT 0 - 44 U/L 27 20 -   Lab Results  Component Value Date   INR 1.4 (H) 03/18/2021   INR 1.0 02/08/2021   INR 1.3 (H) 01/21/2021   PROTIME 15.6 (H) 05/16/2017   PROTIME 20.4 (H) 04/18/2017   PROTIME 14.4 (H) 01/12/2017    Contraindications Contraindications were reviewed? Yes Contraindications to therapy were identified? No   Safety Precautions The following safety precautions for the use of fulvestrant were reviewed:  Fever: reviewed the importance of having a thermometer and the Centers for Disease Control and Prevention (CDC) definition of fever which is 100.73F (38C) or higher. Patient should call 24/7 triage at [phone] if experiencing a fever or any other symptoms Risk of Bleeding Increased exposure in patients with hepatic impairment Injection site reactions  Medication Reconciliation Current Outpatient Medications  Medication Sig Dispense Refill   Cholecalciferol (VITAMIN D3) 5000 UNITS TABS Take 5,000 Units by mouth daily.      HYDROcodone-acetaminophen (NORCO/VICODIN) 5-325 MG tablet Take 1 tablet by mouth every 4 (four) hours as needed for moderate pain.     Multiple Vitamin (MULTIVITAMIN WITH MINERALS) TABS tablet Take 1 tablet by mouth daily.     SYNTHROID 125 MCG tablet TAKE 1 TABLET BY MOUTH EVERY DAY BEFORE BREAKFAST 90 tablet 4    cefdinir (OMNICEF) 300 MG capsule Take 1 capsule (300 mg total) by mouth 2 (two) times daily. (Patient not taking: No sig reported) 14 capsule 0   clotrimazole-betamethasone (LOTRISONE) lotion Apply topically 2 (two) times daily. (Patient not taking: Reported on 03/18/2021) 60 mL 0   escitalopram (LEXAPRO) 5 MG tablet Take 5 mg by mouth daily. (Patient not taking: Reported on 03/18/2021)     ketoconazole (NIZORAL) 2 % cream Apply 1 application topically daily. (Patient not taking: Reported on 03/18/2021) 15 g 0   warfarin (COUMADIN) 5 MG tablet Take 5 mg by mouth daily at 4 PM.     No current facility-administered medications for this visit.    Medication reconciliation is based on the patient's most recent medication list in the electronic medical record (EMR) including herbal products and OTC medications.   The patient's medication list  was reviewed today with the patient? Yes   Drug-drug interactions (DDIs) DDIs were evaluated? Yes DDIs identified?  As above, reviewed with Dr. Jana Hakim and patient possible increase in INR with ciprofloxacin and warfarin.  Instructions given to patient and [name] to monitor INR levels and adjust warfarin dose accordingly. INR subtherapeutic today at 1.4.  Drug-Food Interactions Drug-food interactions were evaluated? Yes Drug-food interactions identified?  Provided copy to patient of warfarin drug information and common foods that may interact.  Encouraged Ms. Penny to not introduce any new dietary items as they may interact with warfarin.  [facility] currently monitoring warfarin and INR levels.  Follow-up Plan  Written instructions provided to patient and caregiver regarding ciprofloxacin orders that will be faxed to [facility] per Dr. Jana Hakim instructions: ciprofloxacin 500 mg by mouth every 12 hours for 5 days; warfarin dose and INR levels to continue to be managed by [facility] while  under their care.  Ciprofloxacin/warfarin may increase levels of INR. Today INR subtherapeutic at 1.4. Current dose of warfarin and INR levels from their facility unknown at this time (requested records). Recommend they continue to follow and adjust INR/warfarin while Ms. Degrasse is under their care. Fulvestrant injection today. Next injection 04/15/21 with visit and labs with Dr. Shona Simpson Resh participated in the discussion, expressed understanding, and voiced agreement with the above plan. All questions were answered to her satisfaction. The patient was advised to contact the clinic at [phone] with any questions or concerns prior to her return visit.   I spent 30 minutes assessing the patient.  Raina Mina, RPH-CPP, 03/18/2021 12:13 PM

## 2021-03-19 LAB — CANCER ANTIGEN 27.29: CA 27.29: 47.1 U/mL — ABNORMAL HIGH (ref 0.0–38.6)

## 2021-03-23 ENCOUNTER — Encounter: Payer: Self-pay | Admitting: Oncology

## 2021-03-23 ENCOUNTER — Telehealth: Payer: Self-pay | Admitting: *Deleted

## 2021-03-23 NOTE — Telephone Encounter (Signed)
This RN spoke with pt per call stating " after I got those shots at your office I came down with the virus "  Per above pt verified she is positive for Covid.  She states " they are giving me 4 huge pills twice a day- is that ok?"  This RN informed her this office does not know what the SNF is giving her and she needs to ask them when they bring her medicines in for her.  Otherwise pt states she is fairing well presently.  Of note pt has a new track phone number of [phone].

## 2021-03-25 ENCOUNTER — Other Ambulatory Visit: Payer: Medicare Other

## 2021-03-25 ENCOUNTER — Ambulatory Visit: Payer: Medicare Other

## 2021-03-25 ENCOUNTER — Ambulatory Visit: Payer: Medicare Other | Admitting: Pharmacist

## 2021-03-29 DIAGNOSIS — J3489 Other specified disorders of nose and nasal sinuses: Secondary | ICD-10-CM | POA: Diagnosis not present

## 2021-03-29 DIAGNOSIS — Z7901 Long term (current) use of anticoagulants: Secondary | ICD-10-CM | POA: Diagnosis not present

## 2021-03-29 DIAGNOSIS — K121 Other forms of stomatitis: Secondary | ICD-10-CM | POA: Diagnosis not present

## 2021-03-29 DIAGNOSIS — I10 Essential (primary) hypertension: Secondary | ICD-10-CM | POA: Diagnosis not present

## 2021-03-30 DIAGNOSIS — H2513 Age-related nuclear cataract, bilateral: Secondary | ICD-10-CM | POA: Diagnosis not present

## 2021-03-30 DIAGNOSIS — H5203 Hypermetropia, bilateral: Secondary | ICD-10-CM | POA: Diagnosis not present

## 2021-03-31 DIAGNOSIS — K121 Other forms of stomatitis: Secondary | ICD-10-CM | POA: Diagnosis not present

## 2021-03-31 DIAGNOSIS — R110 Nausea: Secondary | ICD-10-CM | POA: Diagnosis not present

## 2021-03-31 DIAGNOSIS — D649 Anemia, unspecified: Secondary | ICD-10-CM | POA: Diagnosis not present

## 2021-03-31 DIAGNOSIS — Z7901 Long term (current) use of anticoagulants: Secondary | ICD-10-CM | POA: Diagnosis not present

## 2021-04-02 DIAGNOSIS — Z7901 Long term (current) use of anticoagulants: Secondary | ICD-10-CM | POA: Diagnosis not present

## 2021-04-02 DIAGNOSIS — I209 Angina pectoris, unspecified: Secondary | ICD-10-CM | POA: Diagnosis not present

## 2021-04-02 DIAGNOSIS — F419 Anxiety disorder, unspecified: Secondary | ICD-10-CM | POA: Diagnosis not present

## 2021-04-05 ENCOUNTER — Other Ambulatory Visit: Payer: Self-pay | Admitting: *Deleted

## 2021-04-05 DIAGNOSIS — F419 Anxiety disorder, unspecified: Secondary | ICD-10-CM | POA: Diagnosis not present

## 2021-04-05 DIAGNOSIS — M6281 Muscle weakness (generalized): Secondary | ICD-10-CM | POA: Diagnosis not present

## 2021-04-05 DIAGNOSIS — I209 Angina pectoris, unspecified: Secondary | ICD-10-CM | POA: Diagnosis not present

## 2021-04-05 DIAGNOSIS — N390 Urinary tract infection, site not specified: Secondary | ICD-10-CM | POA: Diagnosis not present

## 2021-04-05 DIAGNOSIS — W19XXXD Unspecified fall, subsequent encounter: Secondary | ICD-10-CM | POA: Diagnosis not present

## 2021-04-05 NOTE — Patient Outreach (Addendum)
Member screened for potential [facility] Care Management needs. Per [facility] Alicia Vasquez resides in [place] [facility].   Attempted to reach [facility] SW to discuss potential [facility] needs. Unable to leave voicemail message due to mailbox full. Communication sent to [facility] SW to inquire about transition plans.   Addendum: Telephone call received Alicia Vasquez, [facility] SW indicating member is returning home. Writer to outreach to discuss [facility] services.   Alicia Rolling, MSN, RN,BSN [place] Acute Care Coordinator [phone] [facility]) [phone]  (Toll free office)

## 2021-04-06 DIAGNOSIS — F339 Major depressive disorder, recurrent, unspecified: Secondary | ICD-10-CM | POA: Diagnosis not present

## 2021-04-07 DIAGNOSIS — Z7901 Long term (current) use of anticoagulants: Secondary | ICD-10-CM | POA: Diagnosis not present

## 2021-04-07 DIAGNOSIS — N390 Urinary tract infection, site not specified: Secondary | ICD-10-CM | POA: Diagnosis not present

## 2021-04-07 DIAGNOSIS — F419 Anxiety disorder, unspecified: Secondary | ICD-10-CM | POA: Diagnosis not present

## 2021-04-07 DIAGNOSIS — M6281 Muscle weakness (generalized): Secondary | ICD-10-CM | POA: Diagnosis not present

## 2021-04-07 DIAGNOSIS — M79605 Pain in left leg: Secondary | ICD-10-CM | POA: Diagnosis not present

## 2021-04-08 ENCOUNTER — Other Ambulatory Visit: Payer: Self-pay | Admitting: *Deleted

## 2021-04-08 NOTE — Patient Outreach (Signed)
[place] Coordinator follow up. Alicia Vasquez screened for [facility] care coordination needs. PCP office has [facility] embedded care coordination team.   Alicia Vasquez resides in [place] [facility]. SNF SW previously indicated transition plan is for member to return home.   Writer attempted to reach Alicia Vasquez to discuss [facility] follow up at [phone]. No answer. HIPAA compliant voicemail message left to request return call. . Also attempted to reach Alicia Vasquez/[facility] [phone]. No answer. HIPAA compliant voicemail message left to request return call.   Will continue to follow and plan outreach again to discuss potential [facility] needs.   [name], MSN, RN,BSN [place] Acute Care Coordinator [phone] [facility]) [phone]  (Toll free office)

## 2021-04-14 ENCOUNTER — Telehealth: Payer: Self-pay | Admitting: *Deleted

## 2021-04-14 DIAGNOSIS — N202 Calculus of kidney with calculus of ureter: Secondary | ICD-10-CM | POA: Diagnosis not present

## 2021-04-14 DIAGNOSIS — N3000 Acute cystitis without hematuria: Secondary | ICD-10-CM | POA: Diagnosis not present

## 2021-04-14 NOTE — Telephone Encounter (Signed)
Pt left VM on nurse line stating she wants to cancel appointments for 04/15/2021 due to she is having recurring kidney issues and will need to see the kidney doctor again.  She thinks she is also being d/ced from [facility] on 11/16 and returning home.  This RN returned her call- obtained identified VM- message left that her appts for 11/17 are being canceled per her request.  This RN sent scheduling request per above with addition for pt to be seen by [facility]/NP with next injection appt.

## 2021-04-15 ENCOUNTER — Inpatient Hospital Stay: Payer: Medicare Other

## 2021-04-15 ENCOUNTER — Inpatient Hospital Stay: Payer: Medicare Other | Admitting: Oncology

## 2021-04-16 DIAGNOSIS — I10 Essential (primary) hypertension: Secondary | ICD-10-CM | POA: Diagnosis not present

## 2021-04-16 DIAGNOSIS — Z7901 Long term (current) use of anticoagulants: Secondary | ICD-10-CM | POA: Diagnosis not present

## 2021-04-16 DIAGNOSIS — F419 Anxiety disorder, unspecified: Secondary | ICD-10-CM | POA: Diagnosis not present

## 2021-04-16 DIAGNOSIS — M6281 Muscle weakness (generalized): Secondary | ICD-10-CM | POA: Diagnosis not present

## 2021-04-19 DIAGNOSIS — N390 Urinary tract infection, site not specified: Secondary | ICD-10-CM | POA: Diagnosis not present

## 2021-04-19 DIAGNOSIS — F419 Anxiety disorder, unspecified: Secondary | ICD-10-CM | POA: Diagnosis not present

## 2021-04-19 DIAGNOSIS — M6281 Muscle weakness (generalized): Secondary | ICD-10-CM | POA: Diagnosis not present

## 2021-04-19 DIAGNOSIS — Z7901 Long term (current) use of anticoagulants: Secondary | ICD-10-CM | POA: Diagnosis not present

## 2021-04-20 ENCOUNTER — Other Ambulatory Visit: Payer: Self-pay | Admitting: *Deleted

## 2021-04-20 NOTE — Patient Outreach (Signed)
[facility] Post- Acute Care Coordinator follow up. Member screened for potential [facility] Care Management needs. Per [facility], Mrs. Polinski transitioned home on 04/19/21 from [facility].  Telephone call made to Mrs. Hueber to discuss [place] Management services. Mrs. [place] requested [facility] literature/brochure be mailed to her before consenting for services. Confirmed address.  Mrs. [place] declined [place] Management services at this time.   Writer will plan to mail [facility] brochure and business card to [employer] address.  [name], MSN, RN,BSN [place] Acute Care Coordinator [phone] [facility]) [phone]  (Toll free office)

## 2021-04-21 ENCOUNTER — Telehealth: Payer: Self-pay

## 2021-04-21 NOTE — Telephone Encounter (Signed)
Okay to place referral? Please advise °

## 2021-04-21 NOTE — Telephone Encounter (Signed)
Patient called in stating she is in need of a wheel chair and is wondering if Dr.Parker will place an order for her to receive one.

## 2021-04-25 ENCOUNTER — Encounter (HOSPITAL_COMMUNITY): Payer: Self-pay

## 2021-04-25 ENCOUNTER — Ambulatory Visit (HOSPITAL_COMMUNITY)
Admission: EM | Admit: 2021-04-25 | Discharge: 2021-04-25 | Disposition: A | Discharge status: Home / Self Care | Payer: Medicare Other | Attending: Internal Medicine | Admitting: Internal Medicine

## 2021-04-25 DIAGNOSIS — L03115 Cellulitis of right lower limb: Secondary | ICD-10-CM | POA: Diagnosis not present

## 2021-04-25 MED ORDER — CEFDINIR 300 MG PO CAPS: 300.0000 mg | ORAL_CAPSULE | Freq: Two times a day (BID) | ORAL | 0 refills | Status: AC

## 2021-04-25 NOTE — Discharge Instructions (Addendum)
Please take medications as prescribed If redness on the legs worsens please call your primary care physician or oncologist as soon as possible Maintain adequate hydration.

## 2021-04-25 NOTE — ED Triage Notes (Signed)
Pt is here with her caregiver today, she noticed her legs were turning red. This started last night. She is currently on antibiotic for bladder infection.

## 2021-04-25 NOTE — ED Provider Notes (Signed)
[place]    CSN: [id number] Arrival date & time: 04/25/21  1549      History   Chief Complaint No chief complaint on file.   HPI Alicia Vasquez is a 80 y.o. female is brought to the urgent care accompanied by family member on account of redness of the lower extremities of 1 day duration.  Patient endorses pain with erythematous area.  He denies any fevers or chills.  Patient is currently on nitrofurantoin for urinary tract infection.  No trauma to the lower extremities.  No shortness of breath or wheezing.  No confusion.  Patient is currently on chemotherapy for metastatic breast cancer. Marland Kitchen   HPI  Past Medical History:  Diagnosis Date   Allergy    Anxiety    Arthritis    Blood transfusion without reported diagnosis    Breast cancer ([place]) 07/12/2013   Invasive Mammary Carcinoma   DVT (deep vein thrombosis) in pregnancy    Hypertension    Hypothyroid    Metastatic cancer to bone ([place]) dx'd 12/2016   hip   Personal history of radiation therapy    Pneumonia    PONV (postoperative nausea and vomiting)    Radiation 11/21/13-01/07/14   Right Breast/Supraclavicular    Patient Active Problem List   Diagnosis Date Noted   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   Weakness 04/27/2021   Aortic atherosclerosis ([place]) 06/13/2017   Pain from bone metastases ([place]) 01/25/2017   Bone metastases ([place]) 01/25/2017   Endometrial hyperplasia 01/16/2017   Lytic bone lesion of left femur 01/15/2017   Hip fracture, pathological ([place]) 01/15/2017   Lymphedema 09/17/2015   Long term current use of anticoagulant therapy 08/23/2015   DVT, lower extremity ([place]) 06/18/2015   Bilateral knee pain 04/03/2015   Arm edema 08/28/2014   Vitamin D deficiency 04/23/2014   Hyperparathyroidism ([place]) 04/23/2014   Depression 07/18/2013   Malignant neoplasm of upper-outer quadrant of right breast in female, estrogen receptor positive ([place]) 07/15/2013   Overactive bladder 01/30/2013   Primary  hypercoagulable state ([place]) 12/19/2012   HTN (hypertension) 09/27/2011   Hearing loss 09/27/2011   Seasonal allergies 09/27/2011   Hypothyroid 08/26/2011    Past Surgical History:  Procedure Laterality Date   BREAST LUMPECTOMY Left 2015   BREAST LUMPECTOMY WITH RADIOACTIVE SEED LOCALIZATION Right 09/09/2013   Procedure: BREAST LUMPECTOMY WITH RADIOACTIVE SEED LOCALIZATION WITH AXILLARY NODE EXCISION;  Surgeon: [name], MD;  Location: [place];  Service: General;  Laterality: Right;   CYSTOSCOPY W/ URETERAL STENT PLACEMENT Right 01/07/2021   Procedure: CYSTOSCOPY WITH RETROGRADE PYELOGRAM/URETERAL STENT PLACEMENT;  Surgeon: Bjorn Loser, MD;  Location: [facility];  Service: Urology;  Laterality: Right;   CYSTOSCOPY/URETEROSCOPY/HOLMIUM LASER/STENT PLACEMENT Right 02/08/2021   Procedure: CYSTOSCOPY, RIGHT URETEROSCOPY, RIGHT RETRGRADE PYELOGRAM, HOLMIUM LASER/STENT PLACEMENT;  Surgeon: Lucas Mallow, MD;  Location: [facility];  Service: Urology;  Laterality: Right;   DENTAL SURGERY  04/19/2012   13 TEETH REMOVED   DILATION AND CURETTAGE OF UTERUS     IR RADIOLOGIST EVAL & MGMT  01/21/2021   ORIF PERIPROSTHETIC FRACTURE Left 01/31/2017   Procedure: REVISION and OPEN REDUCTION INTERNAL FIXATION (ORIF) PERIPROSTHETIC FRACTURE LEFT HIP;  Surgeon: Paralee Cancel, MD;  Location: [facility];  Service: Orthopedics;  Laterality: Left;  120 mins   RE-EXCISION OF BREAST LUMPECTOMY Right 09/24/2013   Procedure: RE-EXCISION OF RIGHT BREAST LUMPECTOMY;  Surgeon: [name], MD;  Location: [place];  Service: General;  Laterality: Right;   TOTAL HIP  ARTHROPLASTY Left 01/16/2017   Procedure: TOTAL HIP ARTHROPLASTY POSTERIOR;  Surgeon: Paralee Cancel, MD;  Location: [facility];  Service: Orthopedics;  Laterality: Left;    OB History   No obstetric history on file.      Home Medications    Prior to Admission medications   Medication Sig Start Date End Date Taking?  Authorizing Provider  cefdinir (OMNICEF) 300 MG capsule Take 1 capsule (300 mg total) by mouth 2 (two) times daily for 7 days. 04/25/21 05/02/21 Yes Sohil Timko, Myrene Galas, MD  Cholecalciferol (VITAMIN D3) 5000 UNITS TABS Take 5,000 Units by mouth daily.     [provider]  HYDROcodone-acetaminophen (NORCO/VICODIN) 5-325 MG tablet Take 1 tablet by mouth every 4 (four) hours as needed for moderate pain.    [provider]  Multiple Vitamin (MULTIVITAMIN WITH MINERALS) TABS tablet Take 1 tablet by mouth daily.    [provider]  SYNTHROID 125 MCG tablet TAKE 1 TABLET BY MOUTH EVERY DAY BEFORE BREAKFAST 01/01/20   Magrinat, [name], MD  warfarin (COUMADIN) 5 MG tablet Take 5 mg by mouth daily. Take 5 mg by mouth Tuesday, Thursday, Saturday. 03/17/21   [provider]  warfarin (COUMADIN) 7.5 MG tablet Take 7.5 mg by mouth daily. Take 7.5 mg by mouth on Monday, Wednesday, Friday, and Sunday. 03/17/21   [provider]    Family History Family History  Problem Relation Age of Onset   Heart disease Brother    Colon cancer Brother    Prostate cancer Brother     Social History Social History   Tobacco Use   Smoking status: Never   Smokeless tobacco: Never  Vaping Use   Vaping Use: Never used  Substance Use Topics   Alcohol use: No    Alcohol/week: 0.0 standard drinks   Drug use: No     Allergies   Anesthetics, amide; Benadryl [diphenhydramine hcl]; Carbocaine [mepivacaine hcl]; Codeine; Epinephrine; Sulfa antibiotics; Diphenhydramine; Latex; Penicillins; and Tramadol   Review of Systems Review of Systems  Constitutional:  Negative for chills and fever.  Cardiovascular: Negative.   Gastrointestinal: Negative.   Genitourinary: Negative.   Skin:  Positive for color change. Negative for rash and wound.  Neurological: Negative.     Physical Exam Triage Vital Signs ED Triage Vitals  Enc Vitals Group     BP 04/25/21 1636 (!) 153/85      Pulse Rate 04/25/21 1636 82     Resp 04/25/21 1636 16     Temp 04/25/21 1636 97.9 F (36.6 C)     Temp Source 04/25/21 1636 Oral     SpO2 04/25/21 1636 100 %     Weight --      Height --      Head Circumference --      Peak Flow --      Pain Score 04/25/21 1637 10     Pain Loc --      Pain Edu? --      Excl. in [place]? --    No data found.  Updated Vital Signs BP (!) 153/85 (BP Location: Left Arm)   Pulse 82   Temp 97.9 F (36.6 C) (Oral)   Resp 16   SpO2 100%   Visual Acuity Right Eye Distance:   Left Eye Distance:   Bilateral Distance:    Right Eye Near:   Left Eye Near:    Bilateral Near:     Physical Exam Vitals and nursing note reviewed.  Constitutional:  General: She is not in acute distress.    Appearance: She is not ill-appearing.  Cardiovascular:     Rate and Rhythm: Normal rate and regular rhythm.  Pulmonary:     Effort: Pulmonary effort is normal.     Breath sounds: Normal breath sounds.  Musculoskeletal:        General: Normal range of motion.  Skin:    General: Skin is warm.     Capillary Refill: Capillary refill takes less than 2 seconds.     Findings: Erythema present. No bruising, lesion or rash.  Neurological:     Mental Status: She is alert.     UC Treatments / Results  Labs (all labs ordered are listed, but only abnormal results are displayed) Labs Reviewed - No data to display  EKG   Radiology No results found.  Procedures Procedures (including critical care time)  Medications Ordered in UC Medications - No data to display  Initial Impression / Assessment and Plan / UC Course  I have reviewed the triage vital signs and the nursing notes.  Pertinent labs & imaging results that were available during my care of the patient were reviewed by me and considered in my medical decision making (see chart for details).     1.  Cellulitis of the legs: Discontinue nitrofurantoin Start Omnicef 300 mg twice daily for 5  days Elevation of the lower extremities Tylenol as needed for pain Follow-up with primary care physician if symptoms worsen.  Final Clinical Impressions(s) / UC Diagnoses   Final diagnoses:  Cellulitis of leg, right     Discharge Instructions      Please take medications as prescribed If redness on the legs worsens please call your primary care physician or oncologist as soon as possible Maintain adequate hydration.     ED Prescriptions     Medication Sig Dispense Auth. Provider   cefdinir (OMNICEF) 300 MG capsule Take 1 capsule (300 mg total) by mouth 2 (two) times daily for 7 days. 14 capsule Kalep Full, Myrene Galas, MD      PDMP not reviewed this encounter.   Chase Picket, MD 04/29/21 [phone]

## 2021-04-26 NOTE — Telephone Encounter (Signed)
We can discuss at her visit tomorrow.  Algis Greenhouse. Jerline Pain, MD 04/26/2021 7:53 AM

## 2021-04-27 ENCOUNTER — Other Ambulatory Visit: Payer: Self-pay

## 2021-04-27 ENCOUNTER — Ambulatory Visit (INDEPENDENT_AMBULATORY_CARE_PROVIDER_SITE_OTHER): Payer: Medicare Other | Admitting: Family Medicine

## 2021-04-27 ENCOUNTER — Encounter: Payer: Self-pay | Admitting: Family Medicine

## 2021-04-27 ENCOUNTER — Telehealth: Payer: Self-pay | Admitting: *Deleted

## 2021-04-27 VITALS — BP 158/81 | HR 84 | Temp 98.0°F | Ht 64.0 in | Wt 217.0 lb

## 2021-04-27 DIAGNOSIS — I890 Lymphedema, not elsewhere classified: Secondary | ICD-10-CM | POA: Diagnosis not present

## 2021-04-27 DIAGNOSIS — R5381 Other malaise: Secondary | ICD-10-CM | POA: Insufficient documentation

## 2021-04-27 DIAGNOSIS — E039 Hypothyroidism, unspecified: Secondary | ICD-10-CM | POA: Diagnosis not present

## 2021-04-27 DIAGNOSIS — R531 Weakness: Secondary | ICD-10-CM | POA: Diagnosis not present

## 2021-04-27 DIAGNOSIS — N200 Calculus of kidney: Secondary | ICD-10-CM | POA: Insufficient documentation

## 2021-04-27 DIAGNOSIS — L03119 Cellulitis of unspecified part of limb: Secondary | ICD-10-CM

## 2021-04-27 DIAGNOSIS — I10 Essential (primary) hypertension: Secondary | ICD-10-CM

## 2021-04-27 NOTE — Assessment & Plan Note (Addendum)
Secondary to debility and deconditioning.has chronic pain related to lytic fracture of left femur secondary to breast cancer metastasis.  Will place referral for home health physical therapy and also for wheelchair.

## 2021-04-27 NOTE — Assessment & Plan Note (Signed)
At goal off medications. 

## 2021-04-27 NOTE — Assessment & Plan Note (Signed)
Stable.  Recently had cellulitis which seems to be improving.

## 2021-04-27 NOTE — Progress Notes (Addendum)
   Alicia Vasquez is a 80 y.o. female who presents today for an office visit.  Assessment/Plan:  Problems Addressed Today: Weakness Secondary to debility and deconditioning.has chronic pain related to lytic fracture of left femur secondary to breast cancer metastasis.  Will place referral for home health physical therapy and also for wheelchair.  Lymphedema Stable.  Recently had cellulitis which seems to be improving.  HTN (hypertension) At goal off medications.  Hypothyroid Continue management per endocrinology.   Nephrolithiasis Continue management per urology.  Cellulitis Improving with Omnicef.    Subjective:  HPI:  Patient here for follow-up.  She was last seen about a year ago.  She was recently discharged from skilled nursing facility about a week ago.  She was admitted to the hospital on 01/05/2019 after a fall at home.  Found to have acute renal failure while hospitalized.  PT was consulted recommended SNF.  She was at her nursing facility for a couple of months.  Since being discharged, she started noticing redness in her legs.  She was urgent care was diagnosed with cellulitis.  Started on Omnicef.  Symptoms seem to be improving.  She still has significant weakness.  Unable to ambulate or perform ADLs due to debility and weakness.  She used a wheelchair on her nursing facility person to help.  She also requests referral for physical therapy.       Objective:  Physical Exam: BP (!) 158/81   Pulse 84   Temp 98 F (36.7 C) (Temporal)   Ht 5\' 4"  (1.626 m)   Wt 217 lb (98.4 kg)   SpO2 97%   BMI 37.25 kg/m   Gen: No acute distress, resting comfortably in wheelchair CV: Regular rate and rhythm with no murmurs appreciated Pulm: Normal work of breathing, clear to auscultation bilaterally with no crackles, wheezes, or rhonchi MSK: Redness in Bilateral lower extremities, improving per patient.  Psych: Normal affect and thought content  Time Spent: 45 minutes of  total time was spent on the date of the encounter performing the following actions: chart review prior to seeing the patient including her recent hospitalization, obtaining history, performing a medically necessary exam, counseling on the treatment plan, placing orders, and documenting in our EHR.         Algis Greenhouse. Jerline Pain, MD 04/27/2021 10:23 AM

## 2021-04-27 NOTE — Assessment & Plan Note (Signed)
Continue management per endocrinology. 

## 2021-04-27 NOTE — Telephone Encounter (Signed)
DME for [facility] was faxed to [place] [phone]

## 2021-04-27 NOTE — Patient Instructions (Addendum)
It was very nice to see you today!  I will refer you to the medical health for physical therapy.  We will order a wheelchair.  Please finish your antibiotics.  Let me know if the cellulitis does not improve.  We will see you back in 6 months. Come back sooner if needed.   Take care, Dr Jerline Pain  PLEASE NOTE:  If you had any lab tests please let us know if you have not heard back within a few days. You may see your results on mychart before we have a chance to review them but we will give you a call once they are reviewed by [name]. If we ordered any referrals today, please let us know if you have not heard from their office within the next week.   Please try these tips to maintain a healthy lifestyle:  Eat at least 3 REAL meals and 1-2 snacks per day.  Aim for no more than 5 hours between eating.  If you eat breakfast, please do so within one hour of getting up.   Each meal should contain half fruits/vegetables, one quarter protein, and one quarter carbs (no bigger than a computer mouse)  Cut down on sweet beverages. This includes juice, soda, and sweet tea.   Drink at least 1 glass of water with each meal and aim for at least 8 glasses per day  Exercise at least 150 minutes every week.

## 2021-04-27 NOTE — Assessment & Plan Note (Signed)
Continue management per urology. 

## 2021-04-28 ENCOUNTER — Telehealth: Payer: Self-pay

## 2021-04-28 DIAGNOSIS — N3000 Acute cystitis without hematuria: Secondary | ICD-10-CM | POA: Diagnosis not present

## 2021-04-28 DIAGNOSIS — H9193 Unspecified hearing loss, bilateral: Secondary | ICD-10-CM | POA: Diagnosis not present

## 2021-04-28 DIAGNOSIS — S22049D Unspecified fracture of fourth thoracic vertebra, subsequent encounter for fracture with routine healing: Secondary | ICD-10-CM | POA: Diagnosis not present

## 2021-04-28 DIAGNOSIS — S32040S Wedge compression fracture of fourth lumbar vertebra, sequela: Secondary | ICD-10-CM | POA: Diagnosis not present

## 2021-04-28 DIAGNOSIS — N179 Acute kidney failure, unspecified: Secondary | ICD-10-CM | POA: Diagnosis not present

## 2021-04-28 DIAGNOSIS — S32049D Unspecified fracture of fourth lumbar vertebra, subsequent encounter for fracture with routine healing: Secondary | ICD-10-CM | POA: Diagnosis not present

## 2021-04-28 DIAGNOSIS — E785 Hyperlipidemia, unspecified: Secondary | ICD-10-CM | POA: Diagnosis not present

## 2021-04-28 DIAGNOSIS — L03116 Cellulitis of left lower limb: Secondary | ICD-10-CM | POA: Diagnosis not present

## 2021-04-28 DIAGNOSIS — Z7901 Long term (current) use of anticoagulants: Secondary | ICD-10-CM | POA: Diagnosis not present

## 2021-04-28 DIAGNOSIS — S32059D Unspecified fracture of fifth lumbar vertebra, subsequent encounter for fracture with routine healing: Secondary | ICD-10-CM | POA: Diagnosis not present

## 2021-04-28 DIAGNOSIS — S32050A Wedge compression fracture of fifth lumbar vertebra, initial encounter for closed fracture: Secondary | ICD-10-CM | POA: Diagnosis not present

## 2021-04-28 DIAGNOSIS — Z9181 History of falling: Secondary | ICD-10-CM | POA: Diagnosis not present

## 2021-04-28 DIAGNOSIS — Z602 Problems related to living alone: Secondary | ICD-10-CM | POA: Diagnosis not present

## 2021-04-28 DIAGNOSIS — S22059D Unspecified fracture of T5-T6 vertebra, subsequent encounter for fracture with routine healing: Secondary | ICD-10-CM | POA: Diagnosis not present

## 2021-04-28 DIAGNOSIS — E079 Disorder of thyroid, unspecified: Secondary | ICD-10-CM | POA: Diagnosis not present

## 2021-04-28 DIAGNOSIS — C50919 Malignant neoplasm of unspecified site of unspecified female breast: Secondary | ICD-10-CM | POA: Diagnosis not present

## 2021-04-28 DIAGNOSIS — N139 Obstructive and reflux uropathy, unspecified: Secondary | ICD-10-CM | POA: Diagnosis not present

## 2021-04-28 DIAGNOSIS — Z86718 Personal history of other venous thrombosis and embolism: Secondary | ICD-10-CM | POA: Diagnosis not present

## 2021-04-28 DIAGNOSIS — N200 Calculus of kidney: Secondary | ICD-10-CM | POA: Diagnosis not present

## 2021-04-28 DIAGNOSIS — L03115 Cellulitis of right lower limb: Secondary | ICD-10-CM | POA: Diagnosis not present

## 2021-04-28 DIAGNOSIS — R41841 Cognitive communication deficit: Secondary | ICD-10-CM | POA: Diagnosis not present

## 2021-04-28 DIAGNOSIS — D649 Anemia, unspecified: Secondary | ICD-10-CM | POA: Diagnosis not present

## 2021-04-28 NOTE — Telephone Encounter (Signed)
Patient would like to know who she was sent to for [facility].    States that [facility] came out to her house this morning and signed a contract.    States she had another phone call from another facility wanting to start [facility].  States she is very confused.    Please give patient a call back in regard.

## 2021-04-29 DIAGNOSIS — S32059D Unspecified fracture of fifth lumbar vertebra, subsequent encounter for fracture with routine healing: Secondary | ICD-10-CM | POA: Diagnosis not present

## 2021-04-29 DIAGNOSIS — N3000 Acute cystitis without hematuria: Secondary | ICD-10-CM | POA: Diagnosis not present

## 2021-04-29 DIAGNOSIS — S22059D Unspecified fracture of T5-T6 vertebra, subsequent encounter for fracture with routine healing: Secondary | ICD-10-CM | POA: Diagnosis not present

## 2021-04-29 DIAGNOSIS — D649 Anemia, unspecified: Secondary | ICD-10-CM | POA: Diagnosis not present

## 2021-04-29 DIAGNOSIS — S22049D Unspecified fracture of fourth thoracic vertebra, subsequent encounter for fracture with routine healing: Secondary | ICD-10-CM | POA: Diagnosis not present

## 2021-04-29 DIAGNOSIS — S32049D Unspecified fracture of fourth lumbar vertebra, subsequent encounter for fracture with routine healing: Secondary | ICD-10-CM | POA: Diagnosis not present

## 2021-04-29 NOTE — Telephone Encounter (Signed)
Alicia Vasquez can you check on this,

## 2021-04-30 ENCOUNTER — Other Ambulatory Visit: Payer: Self-pay

## 2021-04-30 ENCOUNTER — Telehealth: Payer: Self-pay

## 2021-04-30 ENCOUNTER — Other Ambulatory Visit: Payer: Self-pay | Admitting: Oncology

## 2021-04-30 DIAGNOSIS — N3000 Acute cystitis without hematuria: Secondary | ICD-10-CM | POA: Diagnosis not present

## 2021-04-30 DIAGNOSIS — D649 Anemia, unspecified: Secondary | ICD-10-CM | POA: Diagnosis not present

## 2021-04-30 DIAGNOSIS — S32049D Unspecified fracture of fourth lumbar vertebra, subsequent encounter for fracture with routine healing: Secondary | ICD-10-CM | POA: Diagnosis not present

## 2021-04-30 DIAGNOSIS — E039 Hypothyroidism, unspecified: Secondary | ICD-10-CM

## 2021-04-30 DIAGNOSIS — S22049D Unspecified fracture of fourth thoracic vertebra, subsequent encounter for fracture with routine healing: Secondary | ICD-10-CM | POA: Diagnosis not present

## 2021-04-30 DIAGNOSIS — Z7901 Long term (current) use of anticoagulants: Secondary | ICD-10-CM

## 2021-04-30 DIAGNOSIS — S32059D Unspecified fracture of fifth lumbar vertebra, subsequent encounter for fracture with routine healing: Secondary | ICD-10-CM | POA: Diagnosis not present

## 2021-04-30 DIAGNOSIS — S22059D Unspecified fracture of T5-T6 vertebra, subsequent encounter for fracture with routine healing: Secondary | ICD-10-CM | POA: Diagnosis not present

## 2021-04-30 NOTE — Telephone Encounter (Signed)
Home Health Verbal Orders  Agency: [facility]  Requesting PT   Frequency:   1 week 1 - 2 week 4 - 1 week 4   HH needs F2F w/in last 30 days

## 2021-04-30 NOTE — Telephone Encounter (Signed)
Returned pt phone call.  Pt reports to have d/c from her rehab facility and has not had INR checked.  I scheduled pt for appointment 12/5 to have INR checked.  Pt verbalized understanding and thanks.

## 2021-05-03 ENCOUNTER — Other Ambulatory Visit: Payer: Self-pay

## 2021-05-03 ENCOUNTER — Inpatient Hospital Stay: Payer: Medicare Other | Attending: Oncology

## 2021-05-03 DIAGNOSIS — M8580 Other specified disorders of bone density and structure, unspecified site: Secondary | ICD-10-CM | POA: Insufficient documentation

## 2021-05-03 DIAGNOSIS — Z7901 Long term (current) use of anticoagulants: Secondary | ICD-10-CM | POA: Diagnosis not present

## 2021-05-03 DIAGNOSIS — C50411 Malignant neoplasm of upper-outer quadrant of right female breast: Secondary | ICD-10-CM | POA: Diagnosis not present

## 2021-05-03 DIAGNOSIS — Z170 Estrogen receptor positive status [ER+]: Secondary | ICD-10-CM | POA: Diagnosis not present

## 2021-05-03 DIAGNOSIS — I700 Atherosclerosis of aorta: Secondary | ICD-10-CM | POA: Insufficient documentation

## 2021-05-03 DIAGNOSIS — C7951 Secondary malignant neoplasm of bone: Secondary | ICD-10-CM

## 2021-05-03 DIAGNOSIS — I82401 Acute embolism and thrombosis of unspecified deep veins of right lower extremity: Secondary | ICD-10-CM

## 2021-05-03 LAB — CBC WITH DIFFERENTIAL (CANCER CENTER ONLY)
Abs Immature Granulocytes: 0.08 10*3/uL — ABNORMAL HIGH (ref 0.00–0.07)
Basophils Absolute: 0.1 10*3/uL (ref 0.0–0.1)
Basophils Relative: 1 %
Eosinophils Absolute: 0.8 10*3/uL — ABNORMAL HIGH (ref 0.0–0.5)
Eosinophils Relative: 7 %
HCT: 37.4 % (ref 36.0–46.0)
Hemoglobin: 12.1 g/dL (ref 12.0–15.0)
Immature Granulocytes: 1 %
Lymphocytes Relative: 15 %
Lymphs Abs: 1.6 10*3/uL (ref 0.7–4.0)
MCH: 28.6 pg (ref 26.0–34.0)
MCHC: 32.4 g/dL (ref 30.0–36.0)
MCV: 88.4 fL (ref 80.0–100.0)
Monocytes Absolute: 1 10*3/uL (ref 0.1–1.0)
Monocytes Relative: 10 %
Neutro Abs: 6.7 10*3/uL (ref 1.7–7.7)
Neutrophils Relative %: 66 %
Platelet Count: 274 10*3/uL (ref 150–400)
RBC: 4.23 MIL/uL (ref 3.87–5.11)
RDW: 14.7 % (ref 11.5–15.5)
WBC Count: 10.2 10*3/uL (ref 4.0–10.5)
nRBC: 0 % (ref 0.0–0.2)

## 2021-05-03 LAB — CMP (CANCER CENTER ONLY)
ALT: 15 U/L (ref 0–44)
AST: 14 U/L — ABNORMAL LOW (ref 15–41)
Albumin: 3.5 g/dL (ref 3.5–5.0)
Alkaline Phosphatase: 98 U/L (ref 38–126)
Anion gap: 10 (ref 5–15)
BUN: 13 mg/dL (ref 8–23)
CO2: 26 mmol/L (ref 22–32)
Calcium: 10.9 mg/dL — ABNORMAL HIGH (ref 8.9–10.3)
Chloride: 106 mmol/L (ref 98–111)
Creatinine: 0.86 mg/dL (ref 0.44–1.00)
GFR, Estimated: >60 mL/min (ref >60)
Glucose, Bld: 103 mg/dL — ABNORMAL HIGH (ref 70–99)
Potassium: 3.7 mmol/L (ref 3.5–5.1)
Sodium: 142 mmol/L (ref 135–145)
Total Bilirubin: 0.3 mg/dL (ref 0.3–1.2)
Total Protein: 6.5 g/dL (ref 6.5–8.1)

## 2021-05-03 LAB — PROTIME-INR
INR: 1 (ref 0.8–1.2)
Prothrombin Time: 13.4 seconds (ref 11.4–15.2)

## 2021-05-04 ENCOUNTER — Telehealth: Payer: Self-pay

## 2021-05-04 LAB — CANCER ANTIGEN 27.29: CA 27.29: 45.9 U/mL — ABNORMAL HIGH (ref 0.0–38.6)

## 2021-05-04 NOTE — Telephone Encounter (Signed)
Home Health Verbal Orders  Agency:  [facility]  Requesting OT/ PT/ Skilled nursing/ Social Work/ Speech:  PT - OT - Nursing   Frequency:   PT: once a week for one week //  twice a week for 4 weeks //  once a week for 4 weeks    OT: once a week for 5 weeks   Nursing: once a week for 9 weeks.    HH needs F2F w/in last 30 days

## 2021-05-05 NOTE — Telephone Encounter (Signed)
LVM with VO If any question please give [name] a call back

## 2021-05-05 NOTE — Telephone Encounter (Signed)
Ok to give verbal order.  Algis Greenhouse. Jerline Pain, MD 05/05/2021 9:34 AM

## 2021-05-05 NOTE — Telephone Encounter (Signed)
Ok to give VO? 

## 2021-05-05 NOTE — Telephone Encounter (Signed)
Alicia Vasquez from [facility] called wanting check the status of the PT orders. He would like a call back at [phone]. Please Advise.

## 2021-05-05 NOTE — Telephone Encounter (Signed)
Left message to return call to our office at their convenience.  

## 2021-05-06 ENCOUNTER — Other Ambulatory Visit: Payer: Self-pay | Admitting: Oncology

## 2021-05-06 DIAGNOSIS — N3000 Acute cystitis without hematuria: Secondary | ICD-10-CM | POA: Diagnosis not present

## 2021-05-06 DIAGNOSIS — S32059D Unspecified fracture of fifth lumbar vertebra, subsequent encounter for fracture with routine healing: Secondary | ICD-10-CM | POA: Diagnosis not present

## 2021-05-06 DIAGNOSIS — D649 Anemia, unspecified: Secondary | ICD-10-CM | POA: Diagnosis not present

## 2021-05-06 DIAGNOSIS — S22059D Unspecified fracture of T5-T6 vertebra, subsequent encounter for fracture with routine healing: Secondary | ICD-10-CM | POA: Diagnosis not present

## 2021-05-06 DIAGNOSIS — S22049D Unspecified fracture of fourth thoracic vertebra, subsequent encounter for fracture with routine healing: Secondary | ICD-10-CM | POA: Diagnosis not present

## 2021-05-06 DIAGNOSIS — S32049D Unspecified fracture of fourth lumbar vertebra, subsequent encounter for fracture with routine healing: Secondary | ICD-10-CM | POA: Diagnosis not present

## 2021-05-06 MED ORDER — HYDROCODONE-ACETAMINOPHEN 5-325 MG PO TABS: 1.0000 | ORAL_TABLET | Freq: Three times a day (TID) | ORAL | 0 refills | Status: DC | PRN

## 2021-05-07 DIAGNOSIS — Z86718 Personal history of other venous thrombosis and embolism: Secondary | ICD-10-CM

## 2021-05-07 DIAGNOSIS — S22059D Unspecified fracture of T5-T6 vertebra, subsequent encounter for fracture with routine healing: Secondary | ICD-10-CM | POA: Diagnosis not present

## 2021-05-07 DIAGNOSIS — S32040S Wedge compression fracture of fourth lumbar vertebra, sequela: Secondary | ICD-10-CM

## 2021-05-07 DIAGNOSIS — Z602 Problems related to living alone: Secondary | ICD-10-CM

## 2021-05-07 DIAGNOSIS — N3000 Acute cystitis without hematuria: Secondary | ICD-10-CM | POA: Diagnosis not present

## 2021-05-07 DIAGNOSIS — R41841 Cognitive communication deficit: Secondary | ICD-10-CM

## 2021-05-07 DIAGNOSIS — L03115 Cellulitis of right lower limb: Secondary | ICD-10-CM | POA: Diagnosis not present

## 2021-05-07 DIAGNOSIS — N139 Obstructive and reflux uropathy, unspecified: Secondary | ICD-10-CM

## 2021-05-07 DIAGNOSIS — H9193 Unspecified hearing loss, bilateral: Secondary | ICD-10-CM | POA: Diagnosis not present

## 2021-05-07 DIAGNOSIS — E785 Hyperlipidemia, unspecified: Secondary | ICD-10-CM | POA: Diagnosis not present

## 2021-05-07 DIAGNOSIS — S32050A Wedge compression fracture of fifth lumbar vertebra, initial encounter for closed fracture: Secondary | ICD-10-CM

## 2021-05-07 DIAGNOSIS — S32059D Unspecified fracture of fifth lumbar vertebra, subsequent encounter for fracture with routine healing: Secondary | ICD-10-CM | POA: Diagnosis not present

## 2021-05-07 DIAGNOSIS — N179 Acute kidney failure, unspecified: Secondary | ICD-10-CM

## 2021-05-07 DIAGNOSIS — Z9181 History of falling: Secondary | ICD-10-CM

## 2021-05-07 DIAGNOSIS — S32049D Unspecified fracture of fourth lumbar vertebra, subsequent encounter for fracture with routine healing: Secondary | ICD-10-CM | POA: Diagnosis not present

## 2021-05-07 DIAGNOSIS — E079 Disorder of thyroid, unspecified: Secondary | ICD-10-CM | POA: Diagnosis not present

## 2021-05-07 DIAGNOSIS — L03116 Cellulitis of left lower limb: Secondary | ICD-10-CM | POA: Diagnosis not present

## 2021-05-07 DIAGNOSIS — Z7901 Long term (current) use of anticoagulants: Secondary | ICD-10-CM

## 2021-05-07 DIAGNOSIS — N200 Calculus of kidney: Secondary | ICD-10-CM

## 2021-05-07 DIAGNOSIS — C50919 Malignant neoplasm of unspecified site of unspecified female breast: Secondary | ICD-10-CM

## 2021-05-07 DIAGNOSIS — S22049D Unspecified fracture of fourth thoracic vertebra, subsequent encounter for fracture with routine healing: Secondary | ICD-10-CM | POA: Diagnosis not present

## 2021-05-07 DIAGNOSIS — D649 Anemia, unspecified: Secondary | ICD-10-CM | POA: Diagnosis not present

## 2021-05-10 DIAGNOSIS — S22049D Unspecified fracture of fourth thoracic vertebra, subsequent encounter for fracture with routine healing: Secondary | ICD-10-CM | POA: Diagnosis not present

## 2021-05-10 DIAGNOSIS — D649 Anemia, unspecified: Secondary | ICD-10-CM | POA: Diagnosis not present

## 2021-05-10 DIAGNOSIS — S32049D Unspecified fracture of fourth lumbar vertebra, subsequent encounter for fracture with routine healing: Secondary | ICD-10-CM | POA: Diagnosis not present

## 2021-05-10 DIAGNOSIS — S22059D Unspecified fracture of T5-T6 vertebra, subsequent encounter for fracture with routine healing: Secondary | ICD-10-CM | POA: Diagnosis not present

## 2021-05-10 DIAGNOSIS — S32059D Unspecified fracture of fifth lumbar vertebra, subsequent encounter for fracture with routine healing: Secondary | ICD-10-CM | POA: Diagnosis not present

## 2021-05-10 DIAGNOSIS — N3000 Acute cystitis without hematuria: Secondary | ICD-10-CM | POA: Diagnosis not present

## 2021-05-11 DIAGNOSIS — D649 Anemia, unspecified: Secondary | ICD-10-CM | POA: Diagnosis not present

## 2021-05-11 DIAGNOSIS — N3000 Acute cystitis without hematuria: Secondary | ICD-10-CM | POA: Diagnosis not present

## 2021-05-11 DIAGNOSIS — S22049D Unspecified fracture of fourth thoracic vertebra, subsequent encounter for fracture with routine healing: Secondary | ICD-10-CM | POA: Diagnosis not present

## 2021-05-11 DIAGNOSIS — S32049D Unspecified fracture of fourth lumbar vertebra, subsequent encounter for fracture with routine healing: Secondary | ICD-10-CM | POA: Diagnosis not present

## 2021-05-11 DIAGNOSIS — S32059D Unspecified fracture of fifth lumbar vertebra, subsequent encounter for fracture with routine healing: Secondary | ICD-10-CM | POA: Diagnosis not present

## 2021-05-11 DIAGNOSIS — S22059D Unspecified fracture of T5-T6 vertebra, subsequent encounter for fracture with routine healing: Secondary | ICD-10-CM | POA: Diagnosis not present

## 2021-05-12 ENCOUNTER — Telehealth: Payer: Self-pay

## 2021-05-12 DIAGNOSIS — S22059D Unspecified fracture of T5-T6 vertebra, subsequent encounter for fracture with routine healing: Secondary | ICD-10-CM | POA: Diagnosis not present

## 2021-05-12 DIAGNOSIS — N3000 Acute cystitis without hematuria: Secondary | ICD-10-CM | POA: Diagnosis not present

## 2021-05-12 DIAGNOSIS — S32059D Unspecified fracture of fifth lumbar vertebra, subsequent encounter for fracture with routine healing: Secondary | ICD-10-CM | POA: Diagnosis not present

## 2021-05-12 DIAGNOSIS — D649 Anemia, unspecified: Secondary | ICD-10-CM | POA: Diagnosis not present

## 2021-05-12 DIAGNOSIS — S32049D Unspecified fracture of fourth lumbar vertebra, subsequent encounter for fracture with routine healing: Secondary | ICD-10-CM | POA: Diagnosis not present

## 2021-05-12 DIAGNOSIS — S22049D Unspecified fracture of fourth thoracic vertebra, subsequent encounter for fracture with routine healing: Secondary | ICD-10-CM | POA: Diagnosis not present

## 2021-05-12 NOTE — Telephone Encounter (Signed)
Ryan from [facility] called stating that when he saw Alicia Vasquez today,she reported that she fell two days ago. He stated that she fell out of bed. Thurmond Butts stated that she hit her hip and knee. Thurmond Butts can be reached at [phone]. Please Advise.

## 2021-05-12 NOTE — Telephone Encounter (Signed)
She needs appointment if she has ongoing pain. She should be working with PT.  Algis Greenhouse. Jerline Pain, MD 05/12/2021 10:43 AM

## 2021-05-12 NOTE — Telephone Encounter (Signed)
See note

## 2021-05-13 ENCOUNTER — Inpatient Hospital Stay: Payer: Medicare Other

## 2021-05-13 ENCOUNTER — Inpatient Hospital Stay: Payer: Medicare Other | Admitting: Adult Health

## 2021-05-13 ENCOUNTER — Other Ambulatory Visit: Payer: Medicare Other

## 2021-05-13 ENCOUNTER — Ambulatory Visit: Payer: Medicare Other

## 2021-05-13 NOTE — Telephone Encounter (Signed)
LVM for patient to call and make appt if needed and in pain.

## 2021-05-17 ENCOUNTER — Inpatient Hospital Stay: Payer: Medicare Other

## 2021-05-17 ENCOUNTER — Inpatient Hospital Stay (HOSPITAL_BASED_OUTPATIENT_CLINIC_OR_DEPARTMENT_OTHER): Payer: Medicare Other | Admitting: Oncology

## 2021-05-17 ENCOUNTER — Other Ambulatory Visit: Payer: Self-pay

## 2021-05-17 ENCOUNTER — Inpatient Hospital Stay: Payer: Medicare Other | Admitting: Adult Health

## 2021-05-17 VITALS — BP 156/64 | HR 79 | Temp 98.1°F | Resp 17

## 2021-05-17 DIAGNOSIS — C7951 Secondary malignant neoplasm of bone: Secondary | ICD-10-CM | POA: Diagnosis not present

## 2021-05-17 DIAGNOSIS — C50411 Malignant neoplasm of upper-outer quadrant of right female breast: Secondary | ICD-10-CM

## 2021-05-17 DIAGNOSIS — M8580 Other specified disorders of bone density and structure, unspecified site: Secondary | ICD-10-CM | POA: Diagnosis not present

## 2021-05-17 DIAGNOSIS — S32049D Unspecified fracture of fourth lumbar vertebra, subsequent encounter for fracture with routine healing: Secondary | ICD-10-CM | POA: Diagnosis not present

## 2021-05-17 DIAGNOSIS — D6859 Other primary thrombophilia: Secondary | ICD-10-CM

## 2021-05-17 DIAGNOSIS — Z7901 Long term (current) use of anticoagulants: Secondary | ICD-10-CM | POA: Diagnosis not present

## 2021-05-17 DIAGNOSIS — N3000 Acute cystitis without hematuria: Secondary | ICD-10-CM | POA: Diagnosis not present

## 2021-05-17 DIAGNOSIS — S22059D Unspecified fracture of T5-T6 vertebra, subsequent encounter for fracture with routine healing: Secondary | ICD-10-CM | POA: Diagnosis not present

## 2021-05-17 DIAGNOSIS — I700 Atherosclerosis of aorta: Secondary | ICD-10-CM | POA: Diagnosis not present

## 2021-05-17 DIAGNOSIS — Z170 Estrogen receptor positive status [ER+]: Secondary | ICD-10-CM

## 2021-05-17 DIAGNOSIS — D649 Anemia, unspecified: Secondary | ICD-10-CM | POA: Diagnosis not present

## 2021-05-17 DIAGNOSIS — M899 Disorder of bone, unspecified: Secondary | ICD-10-CM

## 2021-05-17 DIAGNOSIS — M84659P Pathological fracture in other disease, hip, unspecified, subsequent encounter for fracture with malunion: Secondary | ICD-10-CM

## 2021-05-17 DIAGNOSIS — S22049D Unspecified fracture of fourth thoracic vertebra, subsequent encounter for fracture with routine healing: Secondary | ICD-10-CM | POA: Diagnosis not present

## 2021-05-17 DIAGNOSIS — S32059D Unspecified fracture of fifth lumbar vertebra, subsequent encounter for fracture with routine healing: Secondary | ICD-10-CM | POA: Diagnosis not present

## 2021-05-17 DIAGNOSIS — I82401 Acute embolism and thrombosis of unspecified deep veins of right lower extremity: Secondary | ICD-10-CM

## 2021-05-17 LAB — CBC WITH DIFFERENTIAL (CANCER CENTER ONLY)
Abs Immature Granulocytes: 0.04 10*3/uL (ref 0.00–0.07)
Basophils Absolute: 0.1 10*3/uL (ref 0.0–0.1)
Basophils Relative: 1 %
Eosinophils Absolute: 0.5 10*3/uL (ref 0.0–0.5)
Eosinophils Relative: 7 %
HCT: 40.3 % (ref 36.0–46.0)
Hemoglobin: 12.8 g/dL (ref 12.0–15.0)
Immature Granulocytes: 1 %
Lymphocytes Relative: 14 %
Lymphs Abs: 1.1 10*3/uL (ref 0.7–4.0)
MCH: 28.6 pg (ref 26.0–34.0)
MCHC: 31.8 g/dL (ref 30.0–36.0)
MCV: 90 fL (ref 80.0–100.0)
Monocytes Absolute: 0.6 10*3/uL (ref 0.1–1.0)
Monocytes Relative: 8 %
Neutro Abs: 5.3 10*3/uL (ref 1.7–7.7)
Neutrophils Relative %: 69 %
Platelet Count: 278 10*3/uL (ref 150–400)
RBC: 4.48 MIL/uL (ref 3.87–5.11)
RDW: 15.2 % (ref 11.5–15.5)
WBC Count: 7.7 10*3/uL (ref 4.0–10.5)
nRBC: 0 % (ref 0.0–0.2)

## 2021-05-17 LAB — CMP (CANCER CENTER ONLY)
ALT: 15 U/L (ref 0–44)
AST: 14 U/L — ABNORMAL LOW (ref 15–41)
Albumin: 3.8 g/dL (ref 3.5–5.0)
Alkaline Phosphatase: 113 U/L (ref 38–126)
Anion gap: 9 (ref 5–15)
BUN: 14 mg/dL (ref 8–23)
CO2: 28 mmol/L (ref 22–32)
Calcium: 10.8 mg/dL — ABNORMAL HIGH (ref 8.9–10.3)
Chloride: 105 mmol/L (ref 98–111)
Creatinine: 0.96 mg/dL (ref 0.44–1.00)
GFR, Estimated: 59 mL/min — ABNORMAL LOW (ref >60)
Glucose, Bld: 122 mg/dL — ABNORMAL HIGH (ref 70–99)
Potassium: 3.5 mmol/L (ref 3.5–5.1)
Sodium: 142 mmol/L (ref 135–145)
Total Bilirubin: 0.3 mg/dL (ref 0.3–1.2)
Total Protein: 6.9 g/dL (ref 6.5–8.1)

## 2021-05-17 LAB — PROTIME-INR
INR: 1.2 (ref 0.8–1.2)
Prothrombin Time: 14.7 seconds (ref 11.4–15.2)

## 2021-05-17 MED ORDER — HYDROCODONE-ACETAMINOPHEN 5-325 MG PO TABS: 1.0000 | ORAL_TABLET | Freq: Three times a day (TID) | ORAL | 0 refills | Status: DC | PRN

## 2021-05-17 MED ORDER — FULVESTRANT 250 MG/5ML IM SOSY
500.0000 mg | PREFILLED_SYRINGE | Freq: Once | INTRAMUSCULAR | Status: AC
  Administered 2021-05-17: 14:00:00 500 mg via INTRAMUSCULAR
  Filled 2021-05-17: qty 10
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
## 2021-05-17 NOTE — Patient Instructions (Signed)
Fulvestrant injection °What is this medication? °FULVESTRANT (ful VES trant) blocks the effects of estrogen. It is used to treat breast cancer. °This medicine may be used for other purposes; ask your health care provider or pharmacist if you have questions. °COMMON BRAND NAME(S): FASLODEX °What should I tell my care team before I take this medication? °They need to know if you have any of these conditions: °bleeding disorders °liver disease °low blood counts, like low white cell, platelet, or red cell counts °an unusual or allergic reaction to fulvestrant, other medicines, foods, dyes, or preservatives °pregnant or trying to get pregnant °breast-feeding °How should I use this medication? °This medicine is for injection into a muscle. It is usually given by a health care professional in a hospital or clinic setting. °Talk to your pediatrician regarding the use of this medicine in children. Special care may be needed. °Overdosage: If you think you have taken too much of this medicine contact a poison control center or emergency room at once. °NOTE: This medicine is only for you. Do not share this medicine with others. °What if I miss a dose? °It is important not to miss your dose. Call your doctor or health care professional if you are unable to keep an appointment. °What may interact with this medication? °medicines that treat or prevent blood clots like warfarin, enoxaparin, dalteparin, apixaban, dabigatran, and rivaroxaban °This list may not describe all possible interactions. Give your health care provider a list of all the medicines, herbs, non-prescription drugs, or dietary supplements you use. Also tell them if you smoke, drink alcohol, or use illegal drugs. Some items may interact with your medicine. °What should I watch for while using this medication? °Your condition will be monitored carefully while you are receiving this medicine. You will need important blood work done while you are taking this  medicine. °Do not become pregnant while taking this medicine or for at least 1 year after stopping it. Women of child-bearing potential will need to have a negative pregnancy test before starting this medicine. Women should inform their doctor if they wish to become pregnant or think they might be pregnant. There is a potential for serious side effects to an unborn child. Men should inform their doctors if they wish to father a child. This medicine may lower sperm counts. Talk to your health care professional or pharmacist for more information. Do not breast-feed an infant while taking this medicine or for 1 year after the last dose. °What side effects may I notice from receiving this medication? °Side effects that you should report to your doctor or health care professional as soon as possible: °allergic reactions like skin rash, itching or hives, swelling of the face, lips, or tongue °feeling faint or lightheaded, falls °pain, tingling, numbness, or weakness in the legs °signs and symptoms of infection like fever or chills; cough; flu-like symptoms; sore throat °vaginal bleeding °Side effects that usually do not require medical attention (report to your doctor or health care professional if they continue or are bothersome): °aches, pains °constipation °diarrhea °headache °hot flashes °nausea, vomiting °pain at site where injected °stomach pain °This list may not describe all possible side effects. Call your doctor for medical advice about side effects. You may report side effects to FDA at 1-800-FDA-1088. °Where should I keep my medication? °This drug is given in a hospital or clinic and will not be stored at home. °NOTE: This sheet is a summary. It may not cover all possible information. If you have   questions about this medicine, talk to your doctor, pharmacist, or health care provider. °© 2022 Elsevier/Gold Standard (2017-08-29 00:00:00) ° °

## 2021-05-17 NOTE — Addendum Note (Signed)
Addended by: Chauncey Cruel on: 05/17/2021 03:34 PM   Modules accepted: Orders

## 2021-05-18 ENCOUNTER — Encounter: Payer: Self-pay | Admitting: Licensed Clinical Social Worker

## 2021-05-18 DIAGNOSIS — S22049D Unspecified fracture of fourth thoracic vertebra, subsequent encounter for fracture with routine healing: Secondary | ICD-10-CM | POA: Diagnosis not present

## 2021-05-18 DIAGNOSIS — S32049D Unspecified fracture of fourth lumbar vertebra, subsequent encounter for fracture with routine healing: Secondary | ICD-10-CM | POA: Diagnosis not present

## 2021-05-18 DIAGNOSIS — S32059D Unspecified fracture of fifth lumbar vertebra, subsequent encounter for fracture with routine healing: Secondary | ICD-10-CM | POA: Diagnosis not present

## 2021-05-18 DIAGNOSIS — S22059D Unspecified fracture of T5-T6 vertebra, subsequent encounter for fracture with routine healing: Secondary | ICD-10-CM | POA: Diagnosis not present

## 2021-05-18 DIAGNOSIS — N3000 Acute cystitis without hematuria: Secondary | ICD-10-CM | POA: Diagnosis not present

## 2021-05-18 DIAGNOSIS — D649 Anemia, unspecified: Secondary | ICD-10-CM | POA: Diagnosis not present

## 2021-05-18 LAB — CANCER ANTIGEN 27.29: CA 27.29: 50 U/mL — ABNORMAL HIGH (ref 0.0–38.6)

## 2021-05-18 NOTE — Progress Notes (Signed)
[facility] CSW Progress Note  [facility] representative received a return TC from adult protective services. Pt's main social worker [name]) is out. Cristy Friedlander [phone]) is covering. Per SW, there is an open APS case. CSW provided updates on concerns relayed by pt earlier today as well as current living location.  APS worker will follow-up with pt to determine what she would like to do moving forward.  CSW updated pt with new DSS worker's name and that that SW will be reaching out to her regarding the situation. Pt expressed understanding.  CSW encouraged pt to reach out with any other needs after working with DSS.   Christeen Douglas , LCSW
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
## 2021-05-19 DIAGNOSIS — S32049D Unspecified fracture of fourth lumbar vertebra, subsequent encounter for fracture with routine healing: Secondary | ICD-10-CM | POA: Diagnosis not present

## 2021-05-19 DIAGNOSIS — D649 Anemia, unspecified: Secondary | ICD-10-CM | POA: Diagnosis not present

## 2021-05-19 DIAGNOSIS — N3000 Acute cystitis without hematuria: Secondary | ICD-10-CM | POA: Diagnosis not present

## 2021-05-19 DIAGNOSIS — S32059D Unspecified fracture of fifth lumbar vertebra, subsequent encounter for fracture with routine healing: Secondary | ICD-10-CM | POA: Diagnosis not present

## 2021-05-19 DIAGNOSIS — S22059D Unspecified fracture of T5-T6 vertebra, subsequent encounter for fracture with routine healing: Secondary | ICD-10-CM | POA: Diagnosis not present

## 2021-05-19 DIAGNOSIS — S22049D Unspecified fracture of fourth thoracic vertebra, subsequent encounter for fracture with routine healing: Secondary | ICD-10-CM | POA: Diagnosis not present

## 2021-05-24 DIAGNOSIS — S32049D Unspecified fracture of fourth lumbar vertebra, subsequent encounter for fracture with routine healing: Secondary | ICD-10-CM | POA: Diagnosis not present

## 2021-05-24 DIAGNOSIS — N3000 Acute cystitis without hematuria: Secondary | ICD-10-CM | POA: Diagnosis not present

## 2021-05-24 DIAGNOSIS — S22049D Unspecified fracture of fourth thoracic vertebra, subsequent encounter for fracture with routine healing: Secondary | ICD-10-CM | POA: Diagnosis not present

## 2021-05-24 DIAGNOSIS — D649 Anemia, unspecified: Secondary | ICD-10-CM | POA: Diagnosis not present

## 2021-05-24 DIAGNOSIS — S32059D Unspecified fracture of fifth lumbar vertebra, subsequent encounter for fracture with routine healing: Secondary | ICD-10-CM | POA: Diagnosis not present

## 2021-05-24 DIAGNOSIS — S22059D Unspecified fracture of T5-T6 vertebra, subsequent encounter for fracture with routine healing: Secondary | ICD-10-CM | POA: Diagnosis not present

## 2021-05-26 ENCOUNTER — Telehealth: Payer: Self-pay | Admitting: Family Medicine

## 2021-05-26 ENCOUNTER — Telehealth: Payer: Self-pay

## 2021-05-26 DIAGNOSIS — S32049D Unspecified fracture of fourth lumbar vertebra, subsequent encounter for fracture with routine healing: Secondary | ICD-10-CM | POA: Diagnosis not present

## 2021-05-26 DIAGNOSIS — S22049D Unspecified fracture of fourth thoracic vertebra, subsequent encounter for fracture with routine healing: Secondary | ICD-10-CM | POA: Diagnosis not present

## 2021-05-26 DIAGNOSIS — N3000 Acute cystitis without hematuria: Secondary | ICD-10-CM | POA: Diagnosis not present

## 2021-05-26 DIAGNOSIS — S22059D Unspecified fracture of T5-T6 vertebra, subsequent encounter for fracture with routine healing: Secondary | ICD-10-CM | POA: Diagnosis not present

## 2021-05-26 DIAGNOSIS — D649 Anemia, unspecified: Secondary | ICD-10-CM | POA: Diagnosis not present

## 2021-05-26 DIAGNOSIS — S32059D Unspecified fracture of fifth lumbar vertebra, subsequent encounter for fracture with routine healing: Secondary | ICD-10-CM | POA: Diagnosis not present

## 2021-05-26 NOTE — Telephone Encounter (Signed)
Verbal order given  

## 2021-05-26 NOTE — Telephone Encounter (Signed)
Left voice message for patient to call clinic.  

## 2021-05-26 NOTE — Telephone Encounter (Signed)
Gordan with [facility] has called in to notify of cellulitis.    States looks like this is in the beginning stages.  Can be reached back at [phone].

## 2021-05-26 NOTE — Telephone Encounter (Signed)
Left message on voicemail to call office. Pt needs to schedule an appointment to evaluate left leg for cellulitis.

## 2021-05-26 NOTE — Telephone Encounter (Signed)
Alicia Vasquez from [place] and needs verbal orders for more social work.

## 2021-05-26 NOTE — Telephone Encounter (Signed)
Patient has called back to schedule appt for 12/29.  All offices are booked for 12/29.  I advised to schedule for 12/30.  Patient states cellulitis is really bad and can not wait that long.  I have transferred patient to access nurse for triage.

## 2021-05-27 DIAGNOSIS — N3000 Acute cystitis without hematuria: Secondary | ICD-10-CM | POA: Diagnosis not present

## 2021-05-27 DIAGNOSIS — D649 Anemia, unspecified: Secondary | ICD-10-CM | POA: Diagnosis not present

## 2021-05-27 DIAGNOSIS — S22059D Unspecified fracture of T5-T6 vertebra, subsequent encounter for fracture with routine healing: Secondary | ICD-10-CM | POA: Diagnosis not present

## 2021-05-27 DIAGNOSIS — S32059D Unspecified fracture of fifth lumbar vertebra, subsequent encounter for fracture with routine healing: Secondary | ICD-10-CM | POA: Diagnosis not present

## 2021-05-27 DIAGNOSIS — S32049D Unspecified fracture of fourth lumbar vertebra, subsequent encounter for fracture with routine healing: Secondary | ICD-10-CM | POA: Diagnosis not present

## 2021-05-27 DIAGNOSIS — S22049D Unspecified fracture of fourth thoracic vertebra, subsequent encounter for fracture with routine healing: Secondary | ICD-10-CM | POA: Diagnosis not present

## 2021-05-27 NOTE — Telephone Encounter (Signed)
See note and advise °

## 2021-05-27 NOTE — Telephone Encounter (Signed)
Agree. She needs to be evaluated ASAP either with [name] or urgent care.  Algis Greenhouse. Jerline Pain, MD 05/27/2021 10:51 AM

## 2021-05-27 NOTE — Telephone Encounter (Signed)
Leg Pain Reason for Call Symptomatic / Request for Health Information Initial Comment Caller states she has cellulitis in her left leg and would like to schedule an appointment. Symptoms include pain almost to the knee. Translation No Nurse Assessment Nurse: [name] Clinton, RN, Marisue Brooklyn Date/Time Eilene Ghazi Time): 05/26/2021 4:55:55 PM Confirm and document reason for call. If symptomatic, describe symptoms. ---Caller reports that she believes she has cellulitis in L leg due to pain and swelling. Reports pain is 10/10 Does the patient have any new or worsening symptoms? ---Yes Will a triage be completed? ---Yes Related visit to physician within the last 2 weeks? ---No Does the PT have any chronic conditions? (i.e. diabetes, asthma, this includes High risk factors for pregnancy, etc.) ---Yes List chronic conditions. ---Breast cancer Is this a behavioral health or substance abuse call? ---No Guidelines Guideline Title Affirmed Question Affirmed Notes Nurse Date/Time Eilene Ghazi Time) Leg Pain Chest pain Layla Barter, [facility] 05/26/2021 4:57:14 PM Disp. Time Eilene Ghazi Time) Disposition Final User 05/26/2021 4:44:29 PM Attempt made - no message left Layla Barter, [email or web address] 05/26/2021 4:59:04 PM Go to ED Now Yes [name] Clinton, RN, Marisue Brooklyn PLEASE NOTE: All timestamps contained within this report are represented as [place] Standard Time. CONFIDENTIALTY NOTICE: This fax transmission is intended only for the addressee. It contains information that is legally privileged, confidential or otherwise protected from use or disclosure. If you are not the intended recipient, you are strictly prohibited from reviewing, disclosing, copying using or disseminating any of this information or taking any action in reliance on or regarding this information. If you have received this fax in error, please notify us immediately by telephone so that we can arrange for its return to [name]. Phone: [phone], Toll-Free: [phone], Fax:  [phone] Page: 2 of 2 Call Id: [id number] [place] Disagree/Comply Comply Caller Understands Yes PreDisposition Did not know what to do

## 2021-05-28 ENCOUNTER — Other Ambulatory Visit: Payer: Self-pay | Admitting: Oncology

## 2021-05-28 DIAGNOSIS — Z9181 History of falling: Secondary | ICD-10-CM | POA: Diagnosis not present

## 2021-05-28 DIAGNOSIS — S32059D Unspecified fracture of fifth lumbar vertebra, subsequent encounter for fracture with routine healing: Secondary | ICD-10-CM | POA: Diagnosis not present

## 2021-05-28 DIAGNOSIS — C50919 Malignant neoplasm of unspecified site of unspecified female breast: Secondary | ICD-10-CM | POA: Diagnosis not present

## 2021-05-28 DIAGNOSIS — S32049D Unspecified fracture of fourth lumbar vertebra, subsequent encounter for fracture with routine healing: Secondary | ICD-10-CM | POA: Diagnosis not present

## 2021-05-28 DIAGNOSIS — S22049D Unspecified fracture of fourth thoracic vertebra, subsequent encounter for fracture with routine healing: Secondary | ICD-10-CM | POA: Diagnosis not present

## 2021-05-28 DIAGNOSIS — L03115 Cellulitis of right lower limb: Secondary | ICD-10-CM | POA: Diagnosis not present

## 2021-05-28 DIAGNOSIS — H9193 Unspecified hearing loss, bilateral: Secondary | ICD-10-CM | POA: Diagnosis not present

## 2021-05-28 DIAGNOSIS — N139 Obstructive and reflux uropathy, unspecified: Secondary | ICD-10-CM | POA: Diagnosis not present

## 2021-05-28 DIAGNOSIS — D649 Anemia, unspecified: Secondary | ICD-10-CM | POA: Diagnosis not present

## 2021-05-28 DIAGNOSIS — N179 Acute kidney failure, unspecified: Secondary | ICD-10-CM | POA: Diagnosis not present

## 2021-05-28 DIAGNOSIS — E079 Disorder of thyroid, unspecified: Secondary | ICD-10-CM | POA: Diagnosis not present

## 2021-05-28 DIAGNOSIS — S22059D Unspecified fracture of T5-T6 vertebra, subsequent encounter for fracture with routine healing: Secondary | ICD-10-CM | POA: Diagnosis not present

## 2021-05-28 DIAGNOSIS — Z86718 Personal history of other venous thrombosis and embolism: Secondary | ICD-10-CM | POA: Diagnosis not present

## 2021-05-28 DIAGNOSIS — Z602 Problems related to living alone: Secondary | ICD-10-CM | POA: Diagnosis not present

## 2021-05-28 DIAGNOSIS — S32050A Wedge compression fracture of fifth lumbar vertebra, initial encounter for closed fracture: Secondary | ICD-10-CM | POA: Diagnosis not present

## 2021-05-28 DIAGNOSIS — Z86711 Personal history of pulmonary embolism: Secondary | ICD-10-CM

## 2021-05-28 DIAGNOSIS — N3000 Acute cystitis without hematuria: Secondary | ICD-10-CM | POA: Diagnosis not present

## 2021-05-28 DIAGNOSIS — R41841 Cognitive communication deficit: Secondary | ICD-10-CM | POA: Diagnosis not present

## 2021-05-28 DIAGNOSIS — N200 Calculus of kidney: Secondary | ICD-10-CM | POA: Diagnosis not present

## 2021-05-28 DIAGNOSIS — E785 Hyperlipidemia, unspecified: Secondary | ICD-10-CM | POA: Diagnosis not present

## 2021-05-28 DIAGNOSIS — S32040S Wedge compression fracture of fourth lumbar vertebra, sequela: Secondary | ICD-10-CM | POA: Diagnosis not present

## 2021-05-28 DIAGNOSIS — L03116 Cellulitis of left lower limb: Secondary | ICD-10-CM | POA: Diagnosis not present

## 2021-05-28 DIAGNOSIS — Z7901 Long term (current) use of anticoagulants: Secondary | ICD-10-CM | POA: Diagnosis not present

## 2021-05-29 ENCOUNTER — Emergency Department (HOSPITAL_COMMUNITY): Payer: Medicare Other

## 2021-05-29 ENCOUNTER — Ambulatory Visit (HOSPITAL_COMMUNITY)
Admission: EM | Admit: 2021-05-29 | Discharge: 2021-05-29 | Disposition: A | Discharge status: Home / Self Care | Payer: Medicare Other

## 2021-05-29 ENCOUNTER — Emergency Department (EMERGENCY_DEPARTMENT_HOSPITAL): Payer: Medicare Other

## 2021-05-29 ENCOUNTER — Other Ambulatory Visit: Payer: Self-pay

## 2021-05-29 ENCOUNTER — Encounter (HOSPITAL_COMMUNITY): Payer: Self-pay | Admitting: Emergency Medicine

## 2021-05-29 ENCOUNTER — Emergency Department (HOSPITAL_COMMUNITY)
Admission: EM | Admit: 2021-05-29 | Discharge: 2021-05-30 | Disposition: A | Discharge status: Home / Self Care | Payer: Medicare Other | Attending: Emergency Medicine | Admitting: Emergency Medicine

## 2021-05-29 DIAGNOSIS — Z85830 Personal history of malignant neoplasm of bone: Secondary | ICD-10-CM | POA: Diagnosis not present

## 2021-05-29 DIAGNOSIS — Z853 Personal history of malignant neoplasm of breast: Secondary | ICD-10-CM | POA: Diagnosis not present

## 2021-05-29 DIAGNOSIS — Z91040 Latex allergy status: Secondary | ICD-10-CM | POA: Insufficient documentation

## 2021-05-29 DIAGNOSIS — R609 Edema, unspecified: Secondary | ICD-10-CM

## 2021-05-29 DIAGNOSIS — R600 Localized edema: Secondary | ICD-10-CM

## 2021-05-29 DIAGNOSIS — I10 Essential (primary) hypertension: Secondary | ICD-10-CM | POA: Insufficient documentation

## 2021-05-29 DIAGNOSIS — Z7901 Long term (current) use of anticoagulants: Secondary | ICD-10-CM | POA: Diagnosis not present

## 2021-05-29 DIAGNOSIS — R072 Precordial pain: Secondary | ICD-10-CM | POA: Diagnosis not present

## 2021-05-29 DIAGNOSIS — M79661 Pain in right lower leg: Secondary | ICD-10-CM | POA: Diagnosis present

## 2021-05-29 DIAGNOSIS — Z86718 Personal history of other venous thrombosis and embolism: Secondary | ICD-10-CM | POA: Diagnosis not present

## 2021-05-29 DIAGNOSIS — Z96642 Presence of left artificial hip joint: Secondary | ICD-10-CM | POA: Diagnosis not present

## 2021-05-29 DIAGNOSIS — M7989 Other specified soft tissue disorders: Secondary | ICD-10-CM | POA: Diagnosis not present

## 2021-05-29 DIAGNOSIS — R079 Chest pain, unspecified: Secondary | ICD-10-CM | POA: Diagnosis not present

## 2021-05-29 DIAGNOSIS — E039 Hypothyroidism, unspecified: Secondary | ICD-10-CM | POA: Insufficient documentation

## 2021-05-29 LAB — CBC WITH DIFFERENTIAL/PLATELET
Abs Immature Granulocytes: 0.03 10*3/uL (ref 0.00–0.07)
Basophils Absolute: 0.1 10*3/uL (ref 0.0–0.1)
Basophils Relative: 1 %
Eosinophils Absolute: 0.3 10*3/uL (ref 0.0–0.5)
Eosinophils Relative: 3 %
HCT: 41.1 % (ref 36.0–46.0)
Hemoglobin: 13 g/dL (ref 12.0–15.0)
Immature Granulocytes: 0 %
Lymphocytes Relative: 19 %
Lymphs Abs: 1.6 10*3/uL (ref 0.7–4.0)
MCH: 29.1 pg (ref 26.0–34.0)
MCHC: 31.6 g/dL (ref 30.0–36.0)
MCV: 92.2 fL (ref 80.0–100.0)
Monocytes Absolute: 0.9 10*3/uL (ref 0.1–1.0)
Monocytes Relative: 11 %
Neutro Abs: 5.8 10*3/uL (ref 1.7–7.7)
Neutrophils Relative %: 66 %
Platelets: 318 10*3/uL (ref 150–400)
RBC: 4.46 MIL/uL (ref 3.87–5.11)
RDW: 15.5 % (ref 11.5–15.5)
WBC: 8.6 10*3/uL (ref 4.0–10.5)
nRBC: 0 % (ref 0.0–0.2)

## 2021-05-29 LAB — BASIC METABOLIC PANEL
Anion gap: 6 (ref 5–15)
BUN: 18 mg/dL (ref 8–23)
CO2: 25 mmol/L (ref 22–32)
Calcium: 10.7 mg/dL — ABNORMAL HIGH (ref 8.9–10.3)
Chloride: 105 mmol/L (ref 98–111)
Creatinine, Ser: 0.93 mg/dL (ref 0.44–1.00)
GFR, Estimated: >60 mL/min (ref >60)
Glucose, Bld: 97 mg/dL (ref 70–99)
Potassium: 4.1 mmol/L (ref 3.5–5.1)
Sodium: 136 mmol/L (ref 135–145)

## 2021-05-29 LAB — BRAIN NATRIURETIC PEPTIDE: B Natriuretic Peptide: 22.5 pg/mL (ref 0.0–100.0)

## 2021-05-29 LAB — TROPONIN I (HIGH SENSITIVITY)
Troponin I (High Sensitivity): 5 ng/L (ref <18)
Troponin I (High Sensitivity): 6 ng/L (ref <18)

## 2021-05-29 MED ORDER — HYDROCODONE-ACETAMINOPHEN 5-325 MG PO TABS
1.0000 | ORAL_TABLET | Freq: Once | ORAL | Status: AC
  Administered 2021-05-29: 1 via ORAL
  Filled 2021-05-29: qty 1

## 2021-05-29 NOTE — ED Provider Notes (Signed)
[facility] DEPT Provider Note   CSN: [id number] Arrival date & time: 05/29/21  1344     History Chief Complaint  Patient presents with   Redness in legs    Possible blood clot     Iliyana Convey Duma is a 80 y.o. female.  The history is provided by the patient, a caregiver and a friend.  Leg Pain Location:  Leg Leg location:  L lower leg and R lower leg Pain details:    Quality:  Aching   Severity:  Moderate   Onset quality:  Gradual   Duration:  3 days   Timing:  Constant   Progression:  Worsening Chronicity:  New Relieved by:  Rest Exacerbated by: Movement. 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  Patient reports to me that she been having bilateral leg swelling for the past 3 days.  She reports she is sitting most of the day and seems to worsen her symptoms. She also reports chest pain that she has had for several weeks.  She reports mild shortness of breath.  No fever/vomiting/cough She reports the chest pain is worse with palpation and reports it may have been from a fall several months    Past Medical History:  Diagnosis Date   Allergy    Anxiety    Arthritis    Blood transfusion without reported diagnosis    Breast cancer ([place]) 07/12/2013   Invasive Mammary Carcinoma   DVT (deep vein thrombosis) in pregnancy    Hypertension    Hypothyroid    Metastatic cancer to bone ([place]) dx'd 12/2016   hip   Personal history of radiation therapy    Pneumonia    PONV (postoperative nausea and vomiting)    Radiation 11/21/13-01/07/14   Right Breast/Supraclavicular    Patient Active Problem List   Diagnosis Date Noted   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   Weakness 04/27/2021   Aortic atherosclerosis ([place]) 06/13/2017   Pain from bone metastases ([place]) 01/25/2017   Bone metastases ([place]) 01/25/2017   Endometrial hyperplasia  01/16/2017   Lytic bone lesion of left femur 01/15/2017   Hip fracture, pathological ([place]) 01/15/2017   Lymphedema 09/17/2015   Long term current use of anticoagulant therapy 08/23/2015   DVT, lower extremity ([place]) 06/18/2015   Bilateral knee pain 04/03/2015   Arm edema 08/28/2014   Vitamin D deficiency 04/23/2014   Hyperparathyroidism ([place]) 04/23/2014   Depression 07/18/2013   Malignant neoplasm of upper-outer quadrant of right breast in female, estrogen receptor positive ([place]) 07/15/2013   Overactive bladder 01/30/2013   Primary hypercoagulable state ([place]) 12/19/2012   HTN (hypertension) 09/27/2011   Hearing loss 09/27/2011   Seasonal allergies 09/27/2011   Hypothyroid 08/26/2011    Past Surgical History:  Procedure Laterality Date   BREAST LUMPECTOMY Left 2015   BREAST LUMPECTOMY WITH RADIOACTIVE SEED LOCALIZATION Right 09/09/2013   Procedure: BREAST LUMPECTOMY WITH RADIOACTIVE SEED LOCALIZATION WITH AXILLARY NODE EXCISION;  Surgeon: [name], MD;  Location: [facility];  Service: General;  Laterality: Right;   CYSTOSCOPY W/ URETERAL STENT PLACEMENT Right 01/07/2021   Procedure: CYSTOSCOPY WITH RETROGRADE PYELOGRAM/URETERAL STENT PLACEMENT;  Surgeon: Bjorn Loser, MD;  Location: [facility];  Service: Urology;  Laterality: Right;   CYSTOSCOPY/URETEROSCOPY/HOLMIUM LASER/STENT PLACEMENT Right 02/08/2021   Procedure: CYSTOSCOPY, RIGHT URETEROSCOPY, RIGHT RETRGRADE PYELOGRAM, HOLMIUM LASER/STENT PLACEMENT;  Surgeon: Lucas Mallow, MD;  Location: [facility];  Service: Urology;  Laterality: Right;   DENTAL SURGERY  04/19/2012   13 TEETH REMOVED   DILATION AND CURETTAGE OF UTERUS     IR RADIOLOGIST EVAL & MGMT  01/21/2021   ORIF PERIPROSTHETIC FRACTURE Left 01/31/2017   Procedure: REVISION and OPEN REDUCTION INTERNAL FIXATION (ORIF) PERIPROSTHETIC FRACTURE LEFT HIP;  Surgeon: Paralee Cancel, MD;  Location: [facility];  Service: Orthopedics;  Laterality: Left;  120 mins    RE-EXCISION OF BREAST LUMPECTOMY Right 09/24/2013   Procedure: RE-EXCISION OF RIGHT BREAST LUMPECTOMY;  Surgeon: [name], MD;  Location: [facility];  Service: General;  Laterality: Right;   TOTAL HIP ARTHROPLASTY Left 01/16/2017   Procedure: TOTAL HIP ARTHROPLASTY POSTERIOR;  Surgeon: Paralee Cancel, MD;  Location[facility];  Service: Orthopedics;  Laterality: Left;     OB History   No obstetric history on file.     Family History  Problem Relation Age of Onset   Heart disease Brother    Colon cancer Brother    Prostate cancer Brother     Social History   Tobacco Use   Smoking status: Never   Smokeless tobacco: Never  Vaping Use   Vaping Use: Never used  Substance Use Topics   Alcohol use: No    Alcohol/week: 0.0 standard drinks   Drug use: No    Home Medications Prior to Admission medications   Medication Sig Start Date End Date Taking? Authorizing Provider  Cholecalciferol (VITAMIN D3) 5000 UNITS TABS Take 5,000 Units by mouth daily.     [provider]  HYDROcodone-acetaminophen (NORCO/VICODIN) 5-325 MG tablet Take 1 tablet by mouth 3 (three) times daily with meals as needed for moderate pain. 05/17/21   Magrinat, [name], MD  Multiple Vitamin (MULTIVITAMIN WITH MINERALS) TABS tablet Take 1 tablet by mouth daily.    [provider]  SYNTHROID 125 MCG tablet TAKE 1 TABLET BY MOUTH EVERY DAY BEFORE BREAKFAST 04/30/21   Magrinat, [name], MD  warfarin (COUMADIN) 5 MG tablet Take 5 mg by mouth daily. Take 5 mg by mouth Tuesday, Thursday, Saturday. 03/17/21   [provider]  warfarin (COUMADIN) 7.5 MG tablet Take 7.5 mg by mouth daily. Take 7.5 mg by mouth on Monday, Wednesday, Friday, and Sunday. 03/17/21   [provider]    Allergies    Anesthetics, amide; Benadryl [diphenhydramine hcl]; Carbocaine [mepivacaine hcl]; Codeine; Epinephrine; Sulfa antibiotics; Diphenhydramine; Latex; Penicillins; and Tramadol  Review  of Systems   Review of Systems  Constitutional:  Negative for fever.  Respiratory:  Positive for shortness of breath. Negative for cough.   Cardiovascular:  Positive for chest pain and leg swelling.  Gastrointestinal:  Negative for vomiting.  All other systems reviewed and are negative.  Physical Exam Updated Vital Signs BP (!) 170/100    Pulse 90    Temp 98.5 F (36.9 C) (Oral)    Resp 18    SpO2 99%   Physical Exam CONSTITUTIONAL: Elderly, no acute HEAD: Normocephalic/atraumatic EYES: EOMI/PERRL ENMT: Mucous membranes moist, hirsutism NECK: supple no meningeal signs SPINE/BACK:entire spine nontender CV: S1/S2 noted, no murmurs/rubs/gallops noted LUNGS: Lungs are clear to auscultation bilaterally, no apparent distress Chest tender to palpation, no crepitus ABDOMEN: soft, nontender NEURO: Pt is awake/alert/appropriate, moves all extremitiesx4.  No facial droop.   EXTREMITIES: pulses normal/equal, full ROM, distal pulses equal and intact.  Pitting edema noted bilateral lower extremities.  No crepitus.  No deformities.  See photo below SKIN: warm, color normal, see photo PSYCH: no abnormalities of mood  noted, alert and oriented to situation   Patient gave verbal permission to utilize photo for medical documentation only The image was not stored on any personal device ED Results / Procedures / Treatments   Labs (all labs ordered are listed, but only abnormal results are displayed) Labs Reviewed  BASIC METABOLIC PANEL - Abnormal; Notable for the following components:      Result Value   Calcium 10.7 (*)    All other components within normal limits  CBC WITH DIFFERENTIAL/PLATELET  BRAIN NATRIURETIC PEPTIDE  TROPONIN I (HIGH SENSITIVITY)  TROPONIN I (HIGH SENSITIVITY)    EKG EKG Interpretation  Date/Time:  Saturday May 29 2021 15:01:29 EST Ventricular Rate:  74 PR Interval:  168 QRS Duration: 84 QT Interval:  360 QTC Calculation: 400 R Axis:   -5 Text  Interpretation: Sinus rhythm Consider anterior infarct since last tracing no significant change Confirmed by Daleen Bo [phone]) on 05/29/2021 3:51:11 PM  Radiology DG Chest 2 View  Result Date: 05/29/2021 CLINICAL DATA:  Chest pain. EXAM: CHEST - 2 VIEW COMPARISON:  January 07, 2021. FINDINGS: The heart size and mediastinal contours are within normal limits. Both lungs are clear. The visualized skeletal structures are unremarkable. IMPRESSION: No active cardiopulmonary disease. Electronically Signed   By: Marijo Conception M.D.   On: 05/29/2021 15:26   VAS [name] LOWER EXTREMITY VENOUS (DVT) (7a-7p)  Result Date: 05/29/2021  Lower Venous DVT Study Patient Name:  JAEMARIE HOCHBERG [facility]  Date of Exam:   05/29/2021 Medical Rec #: [id number]            Accession #:    [id number] Date of Birth: 11/19/1940            Patient Gender: F Patient Age:   27 years Exam Location:  [facility] Procedure:      VAS [name] LOWER EXTREMITY VENOUS (DVT) Referring Phys: [facility] SPONSELLER --------------------------------------------------------------------------------  Indications: Swelling, and Edema.  Limitations: Patient pain tolerance. Comparison Study: 07/24/20 prior Performing Technologist: Archie Patten RVS  Examination Guidelines: A complete evaluation includes B-mode imaging, spectral Doppler, color Doppler, and power Doppler as needed of all accessible portions of each vessel. Bilateral testing is considered an integral part of a complete examination. Limited examinations for reoccurring indications may be performed as noted. The reflux portion of the exam is performed with the patient in reverse Trendelenburg.  +---------+---------------+---------+-----------+----------+-------------------+  RIGHT     Compressibility Phasicity Spontaneity Properties Thrombus Aging       +---------+---------------+---------+-----------+----------+-------------------+  CFV       Full            Yes       Yes                                          +---------+---------------+---------+-----------+----------+-------------------+  SFJ       Full                                                                  +---------+---------------+---------+-----------+----------+-------------------+  FV Prox   Full                                                                  +---------+---------------+---------+-----------+----------+-------------------+  FV Mid    Full                                                                  +---------+---------------+---------+-----------+----------+-------------------+  FV Distal Full                                                                  +---------+---------------+---------+-----------+----------+-------------------+  PFV       Full                                                                  +---------+---------------+---------+-----------+----------+-------------------+  POP       Full            Yes       Yes                                         +---------+---------------+---------+-----------+----------+-------------------+  PTV       Full                                                                  +---------+---------------+---------+-----------+----------+-------------------+  PERO                                                       Not well visualized  +---------+---------------+---------+-----------+----------+-------------------+   +---------+---------------+---------+-----------+----------+-------------------+  LEFT      Compressibility Phasicity Spontaneity Properties Thrombus Aging       +---------+---------------+---------+-----------+----------+-------------------+  CFV       Full            Yes       Yes                                         +---------+---------------+---------+-----------+----------+-------------------+  SFJ       Full                                                                   +---------+---------------+---------+-----------+----------+-------------------+  FV Prox   Full                                                                  +---------+---------------+---------+-----------+----------+-------------------+  FV Mid    Full                                                                  +---------+---------------+---------+-----------+----------+-------------------+  FV Distal                 Yes       Yes                                         +---------+---------------+---------+-----------+----------+-------------------+  PFV       Full                                                                  +---------+---------------+---------+-----------+----------+-------------------+  POP       Full            Yes       Yes                                         +---------+---------------+---------+-----------+----------+-------------------+  PTV       Full                                                                  +---------+---------------+---------+-----------+----------+-------------------+  PERO                                                       Not well visualized  +---------+---------------+---------+-----------+----------+-------------------+     Summary: BILATERAL: - No evidence of deep vein thrombosis seen in the lower extremities, bilaterally. -No evidence of popliteal cyst, bilaterally.   *See table(s) above for measurements and observations. Electronically signed by Monica Martinez MD on 05/29/2021 at 7:08:39 PM.    Final     Procedures Procedures   Medications Ordered in ED Medications  HYDROcodone-acetaminophen (NORCO/VICODIN) 5-325 MG per tablet 1 tablet (1 tablet Oral Given 05/29/21 2338)    ED Course  I have reviewed the triage vital signs and the nursing notes.  Pertinent labs & imaging results that were available during my care of the patient were reviewed by me and considered in my medical decision making (see chart for details).     MDM Rules/Calculators/A&P                         This patient presents to the ED for concern of chest pain and leg swelling, this involves an extensive number of treatment options, and is a complaint  that carries with it a high risk of complications and morbidity.  The differential diagnosis includes acute coronary syndrome, dissection, PE, pneumonia, DVT, pericarditis  Comorbidities that complicate the patient evaluation: Patients presentation is complicated by their history of frailty, breast cancer, previous DVT  Social Determinants of Health: Patients poor mobility increases the complexity of managing their presentation  Additional history obtained: Additional history obtained from friend who was at bedside Records reviewed recent urgent care notes reviewed  Lab Tests: I Ordered, and personally interpreted labs.  The pertinent results include: Mild hypercalcemia  Imaging Studies ordered: I ordered imaging studies including chest x-ray I independently visualized and interpreted imaging which showed no acute findings I agree with the radiologist interpretation  Cardiac Monitoring: The patient was maintained on a cardiac monitor.  I personally viewed and interpreted the cardiac monitor which showed an underlying rhythm of: Sinus rhythm  Medicines ordered and prescription drug management: I ordered medication including Vicodin for pain   Reevaluation: After the interventions noted above, I reevaluated the patient and found that they have :stayed the same  Complexity of problems addressed: Patients presentation is most consistent with acute complicated illness requiring work-up  Disposition: After consideration of the diagnostic results and the patients response to treatment, I feel that the patent would benefit from discharge home.     DVT study negative.  Does not appear consistent with cellulitis on my exam.  Chest pain is reproducible is been present for weeks and  reports its due to previous trauma will discharge home  Final Clinical Impression(s) / ED Diagnoses Final diagnoses:  Peripheral edema  Precordial pain    Rx / DC Orders ED Discharge Orders     None        Ripley Fraise, MD 05/29/21 2342

## 2021-05-29 NOTE — Progress Notes (Signed)
Lower extremity venous has been completed.   Preliminary results in CV Proc.   Alicia Vasquez Alicia Vasquez 05/29/2021 2:55 PM

## 2021-05-29 NOTE — Discharge Instructions (Signed)
-  Please head to the emergency department for further management, I am concerned for a blood clot given the swelling in your leg and the whiteness in your toes.  Please head straight there, an untreated blood clot can be deadly.

## 2021-05-29 NOTE — ED Triage Notes (Signed)
Pt presents to the office for follow-up on leg swelling and pain. She was seen on 04/25/2021 and prescribed medication for her symptoms but pain and swelling have returned.

## 2021-05-29 NOTE — ED Provider Notes (Signed)
Emergency Medicine Provider Triage Evaluation Note  Alicia Vasquez , a 79 y.o. female  was evaluated in triage.  Pt complains of bilateral lower extremity swelling worse on the left on the right with left leg pain and calf tenderness to palpation for the last 4 days.  History of DVT, currently on chemo for metastatic breast cancer.  Anticoagulated on warfarin.  Directed to the ED by urgent care.  Additionally worsened left-sided chest pain started at urgent care that does not radiate without associated shortness of breath.  Review of Systems  Positive: Chest pain, bilateral lower extremity edema left side greater than right with left calf pain Negative: Shortness of breath, palpitations, syncope  Physical Exam  BP (!) 189/96    Pulse 76    Temp 98.5 F (36.9 C) (Oral)    Resp 20    SpO2 100%  Gen:   Awake, no distress   Resp:  Normal effort  MSK:   Moves extremities without difficulty  Other:  RRR no M/R/G.  Lungs CTA B.  Bilateral lower extremity pitting edema up to the knees, 3+ with left calf tenderness to palpation and erythema.  Faint but palpable pulses in the DP bilaterally.  Medical Decision Making  Medically screening exam initiated at 2:05 PM.  Appropriate orders placed.  [name] was informed that the remainder of the evaluation will be completed by another provider, this initial triage assessment does not replace that evaluation, and the importance of remaining in the ED until their evaluation is complete.  This chart was dictated using voice recognition software, Dragon. Despite the best efforts of this provider to proofread and correct errors, errors may still occur which can change documentation meaning.    [name], PA-C 05/29/21 1406    Daleen Bo, MD 05/30/21 1004

## 2021-05-29 NOTE — Discharge Instructions (Signed)

## 2021-05-29 NOTE — ED Notes (Signed)
Save blue tube in main lab °

## 2021-05-29 NOTE — ED Triage Notes (Addendum)
Pt reports swelling and pain and redness in bilateral legs. Pt reports UC sent her here d/t possible blood clot. Hx cellulitis, just off of antibiotics

## 2021-05-29 NOTE — ED Provider Notes (Signed)
[facility]    CSN: [id number] Arrival date & time: 05/29/21  1213      History   Chief Complaint Chief Complaint  Patient presents with   Follow-up    Leg and knee swelling     HPI Alicia Vasquez is a 80 y.o. female presenting with concern for cellulitis. History DVT 2017, lymphedema, Patient is currently on chemotherapy for metastatic breast cancer. Last cellulitis 03/2021 treated with omnicef.  Presents with 3 weeks of unilateral left lower extremity pain and swelling following a fall.  She is wheelchair-bound at baseline, with poor mobility.  States that the pain is getting progressively worse.  Was told to follow-up with her primary care 3 days ago, was unable to do this and so she is at the urgent care today.  Denies shortness of breath, chest pain, dizziness, fever/chills.  Here today with daughter.  HPI  Past Medical History:  Diagnosis Date   Allergy    Anxiety    Arthritis    Blood transfusion without reported diagnosis    Breast cancer ([place]) 07/12/2013   Invasive Mammary Carcinoma   DVT (deep vein thrombosis) in pregnancy    Hypertension    Hypothyroid    Metastatic cancer to bone ([place]) dx'd 12/2016   hip   Personal history of radiation therapy    Pneumonia    PONV (postoperative nausea and vomiting)    Radiation 11/21/13-01/07/14   Right Breast/Supraclavicular    Patient Active Problem List   Diagnosis Date Noted   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   Weakness 04/27/2021   Aortic atherosclerosis ([place]) 06/13/2017   Pain from bone metastases ([place]) 01/25/2017   Bone metastases ([place]) 01/25/2017   Endometrial hyperplasia 01/16/2017   Lytic bone lesion of left femur 01/15/2017   Hip fracture, pathological ([place]) 01/15/2017   Lymphedema 09/17/2015   Long term current use of anticoagulant therapy 08/23/2015   DVT, lower extremity ([place]) 06/18/2015   Bilateral knee pain 04/03/2015   Arm edema 08/28/2014   Vitamin D deficiency 04/23/2014    Hyperparathyroidism ([place]) 04/23/2014   Depression 07/18/2013   Malignant neoplasm of upper-outer quadrant of right breast in female, estrogen receptor positive ([place]) 07/15/2013   Overactive bladder 01/30/2013   Primary hypercoagulable state ([place]) 12/19/2012   HTN (hypertension) 09/27/2011   Hearing loss 09/27/2011   Seasonal allergies 09/27/2011   Hypothyroid 08/26/2011    Past Surgical History:  Procedure Laterality Date   BREAST LUMPECTOMY Left 2015   BREAST LUMPECTOMY WITH RADIOACTIVE SEED LOCALIZATION Right 09/09/2013   Procedure: BREAST LUMPECTOMY WITH RADIOACTIVE SEED LOCALIZATION WITH AXILLARY NODE EXCISION;  Surgeon: [name], MD;  Location: [facility];  Service: General;  Laterality: Right;   CYSTOSCOPY W/ URETERAL STENT PLACEMENT Right 01/07/2021   Procedure: CYSTOSCOPY WITH RETROGRADE PYELOGRAM/URETERAL STENT PLACEMENT;  Surgeon: Bjorn Loser, MD;  [facility];  Service: Urology;  Laterality: Right;   CYSTOSCOPY/URETEROSCOPY/HOLMIUM LASER/STENT PLACEMENT Right 02/08/2021   Procedure: CYSTOSCOPY, RIGHT URETEROSCOPY, RIGHT RETRGRADE PYELOGRAM, HOLMIUM LASER/STENT PLACEMENT;  Surgeon: Lucas Mallow, MD;  [facility];  Service: Urology;  Laterality: Right;   DENTAL SURGERY  04/19/2012   13 TEETH REMOVED   DILATION AND CURETTAGE OF UTERUS     IR RADIOLOGIST EVAL & MGMT  01/21/2021   ORIF PERIPROSTHETIC FRACTURE Left 01/31/2017   Procedure: REVISION and OPEN REDUCTION INTERNAL FIXATION (ORIF) PERIPROSTHETIC FRACTURE LEFT HIP;  Surgeon: Paralee Cancel, MD;  [facility];  Service: Orthopedics;  Laterality: Left;  120 mins  RE-EXCISION OF BREAST LUMPECTOMY Right 09/24/2013   Procedure: RE-EXCISION OF RIGHT BREAST LUMPECTOMY;  Surgeon: [name], MD;  Location: [facility];  Service: General;  Laterality: Right;   TOTAL HIP ARTHROPLASTY Left 01/16/2017   Procedure: TOTAL HIP ARTHROPLASTY POSTERIOR;  Surgeon: Paralee Cancel,  MD;  [facility];  Service: Orthopedics;  Laterality: Left;    OB History   No obstetric history on file.      Home Medications    Prior to Admission medications   Medication Sig Start Date End Date Taking? Authorizing Provider  Cholecalciferol (VITAMIN D3) 5000 UNITS TABS Take 5,000 Units by mouth daily.     [provider]  HYDROcodone-acetaminophen (NORCO/VICODIN) 5-325 MG tablet Take 1 tablet by mouth 3 (three) times daily with meals as needed for moderate pain. 05/17/21   Magrinat, [name], MD  Multiple Vitamin (MULTIVITAMIN WITH MINERALS) TABS tablet Take 1 tablet by mouth daily.    [provider]  SYNTHROID 125 MCG tablet TAKE 1 TABLET BY MOUTH EVERY DAY BEFORE BREAKFAST 04/30/21   Magrinat, [name], MD  warfarin (COUMADIN) 5 MG tablet Take 5 mg by mouth daily. Take 5 mg by mouth Tuesday, Thursday, Saturday. 03/17/21   [provider]  warfarin (COUMADIN) 7.5 MG tablet Take 7.5 mg by mouth daily. Take 7.5 mg by mouth on Monday, Wednesday, Friday, and Sunday. 03/17/21   [provider]    Family History Family History  Problem Relation Age of Onset   Heart disease Brother    Colon cancer Brother    Prostate cancer Brother     Social History Social History   Tobacco Use   Smoking status: Never   Smokeless tobacco: Never  Vaping Use   Vaping Use: Never used  Substance Use Topics   Alcohol use: No    Alcohol/week: 0.0 standard drinks   Drug use: No     Allergies   Anesthetics, amide; Benadryl [diphenhydramine hcl]; Carbocaine [mepivacaine hcl]; Codeine; Epinephrine; Sulfa antibiotics; Diphenhydramine; Latex; Penicillins; and Tramadol   Review of Systems Review of Systems  Skin:  Positive for color change and pallor.  All other systems reviewed and are negative.   Physical Exam Triage Vital Signs ED Triage Vitals  Enc Vitals Group     BP      Pulse      Resp      Temp      Temp src      SpO2      Weight       Height      Head Circumference      Peak Flow      Pain Score      Pain Loc      Pain Edu?      Excl. in [place]?    No data found.  Updated Vital Signs BP (!) 163/84 (BP Location: Left Arm)    Pulse 96    Temp 98.1 F (36.7 C) (Oral)    Resp 16    SpO2 100%   Visual Acuity Right Eye Distance:   Left Eye Distance:   Bilateral Distance:    Right Eye Near:   Left Eye Near:    Bilateral Near:     Physical Exam Vitals reviewed.  Constitutional:      General: She is not in acute distress.    Appearance: Normal appearance. She is not ill-appearing or diaphoretic.  HENT:     Head: Normocephalic and atraumatic.  Cardiovascular:  Rate and Rhythm: Normal rate and regular rhythm.     Heart sounds: Normal heart sounds.  Pulmonary:     Effort: Pulmonary effort is normal.     Breath sounds: Normal breath sounds.  Skin:    General: Skin is warm.     Comments: See image below L LE with effusion and tenderness, worse over proximal aspect. There is warmth, erythema, and tenderness ankle. Effusion is significantly tender to palpation proximally. L toes 2-5 are pallid and cap refill >3 seconds. Positive homan sign L side. L calf measures 48cm, R calf 46cm.   Neurological:     General: No focal deficit present.     Mental Status: She is alert and oriented to person, place, and time.  Psychiatric:        Mood and Affect: Mood normal.        Behavior: Behavior normal.        Thought Content: Thought content normal.        Judgment: Judgment normal.       UC Treatments / Results  Labs (all labs ordered are listed, but only abnormal results are displayed) Labs Reviewed - No data to display  EKG   Radiology No results found.  Procedures Procedures (including critical care time)  Medications Ordered in UC Medications - No data to display  Initial Impression / Assessment and Plan / UC Course  I have reviewed the triage vital signs and the nursing notes.  Pertinent labs &  imaging results that were available during my care of the patient were reviewed by me and considered in my medical decision making (see chart for details).     This patient is a very pleasant 80 y.o. year old female presenting with cellulitis associated with unilateral L LE edema and pain following trauma x3 weeks. Poor cap refill and pale toes on associated foot. History DVT. Poor mobility/ wheelchair bound. Currently being treated for metastatic cancer. Wells score 4. Given potential neurovascular compromise and concern for DVT, sent to ED via personal vehicle, pt and caregiver are in agreement. We did see this pt for cellulitis L LE 03/2021 and treated with Omnicef with temporary improvement in symptoms.   Final Clinical Impressions(s) / UC Diagnoses   Final diagnoses:  Unilateral edema of lower extremity  History of DVT in adulthood     Discharge Instructions      -Please head to the emergency department for further management, I am concerned for a blood clot given the swelling in your leg and the whiteness in your toes.  Please head straight there, an untreated blood clot can be deadly.     ED Prescriptions   None    PDMP not reviewed this encounter.   Hazel Sams, PA-C 05/29/21 1315

## 2021-05-31 DIAGNOSIS — S32059D Unspecified fracture of fifth lumbar vertebra, subsequent encounter for fracture with routine healing: Secondary | ICD-10-CM | POA: Diagnosis not present

## 2021-05-31 DIAGNOSIS — S22049D Unspecified fracture of fourth thoracic vertebra, subsequent encounter for fracture with routine healing: Secondary | ICD-10-CM | POA: Diagnosis not present

## 2021-05-31 DIAGNOSIS — S32049D Unspecified fracture of fourth lumbar vertebra, subsequent encounter for fracture with routine healing: Secondary | ICD-10-CM | POA: Diagnosis not present

## 2021-05-31 DIAGNOSIS — D649 Anemia, unspecified: Secondary | ICD-10-CM | POA: Diagnosis not present

## 2021-05-31 DIAGNOSIS — N3000 Acute cystitis without hematuria: Secondary | ICD-10-CM | POA: Diagnosis not present

## 2021-05-31 DIAGNOSIS — S22059D Unspecified fracture of T5-T6 vertebra, subsequent encounter for fracture with routine healing: Secondary | ICD-10-CM | POA: Diagnosis not present

## 2021-06-03 ENCOUNTER — Encounter: Payer: Self-pay | Admitting: Family

## 2021-06-03 ENCOUNTER — Ambulatory Visit (INDEPENDENT_AMBULATORY_CARE_PROVIDER_SITE_OTHER): Payer: Medicare Other | Admitting: Family

## 2021-06-03 ENCOUNTER — Other Ambulatory Visit: Payer: Self-pay

## 2021-06-03 ENCOUNTER — Ambulatory Visit: Payer: Medicare Other | Admitting: Family Medicine

## 2021-06-03 VITALS — BP 154/87 | HR 73 | Temp 98.3°F | Ht 64.0 in | Wt 216.9 lb

## 2021-06-03 DIAGNOSIS — L89321 Pressure ulcer of left buttock, stage 1: Secondary | ICD-10-CM | POA: Diagnosis not present

## 2021-06-03 DIAGNOSIS — R600 Localized edema: Secondary | ICD-10-CM | POA: Diagnosis not present

## 2021-06-03 MED ORDER — FUROSEMIDE 20 MG PO TABS: 20.0000 mg | ORAL_TABLET | Freq: Every day | ORAL | 1 refills | Status: DC

## 2021-06-03 NOTE — Patient Instructions (Addendum)
It was very nice to see you today!  As discussed, I have sent a fluid pill, Lasix (Furosemide) for you to start taking for the fluid in your legs. You will take 2 pills the next 3 mornings, then reduce to 1 pill every morning. Schedule a follow up visit with Dr. Jerline Pain for 2 weeks to reassess and check your labs.  USE A BEDSIDE commode especially the next few days to avoid rushing to bathroom and possibly falling.  Ask your home health nurse tomorrow to look at the skin on your left buttuck, and ask about applying a skin barrier cream (e.g. Coloplast) or a hydrocolloid patch to your bottom to protect the skin.    PLEASE NOTE:  If you had any lab tests please let us know if you have not heard back within a few days. You may see your results on MyChart before we have a chance to review them but we will give you a call once they are reviewed by [name]. If we ordered any referrals today, please let us know if you have not heard from their office within the next week.   Please try these tips to maintain a healthy lifestyle:  Eat most of your calories during the day when you are active. Eliminate processed foods including packaged sweets (pies, cakes, cookies), reduce intake of potatoes, white bread, white pasta, and white rice. Look for whole grain options, oat flour or almond flour.  Each meal should contain half fruits/vegetables, one quarter protein, and one quarter carbs (no bigger than a computer mouse).  Cut down on sweet beverages. This includes juice, soda, and sweet tea. Also watch fruit intake, though this is a healthier sweet option, it still contains natural sugar! Limit to 3 servings daily.  Drink at least 1 glass of water with each meal and aim for at least 8 glasses per day  Exercise at least 150 minutes every week.

## 2021-06-03 NOTE — Progress Notes (Signed)
Subjective:     Patient ID: Alicia Vasquez, female    DOB: 1940-12-21, 81 y.o.   MRN: [id number]  Chief Complaint  Patient presents with   Joint Swelling    Left knee; [facility] 2 month ago at nursing facility. [place] [facility] again about  weeks afterwards. Rod in left leg.     HPI: Knee Pain: Patient presents with knee swelling involving the  left knee. Onset of the symptoms was several months ago. Inciting event: injured during a fall while in nursing home . Current symptoms include stiffness and swelling. Pain is aggravated by going up and down stairs, rising after sitting, standing, and walking.  Patient has had no prior knee problems. Evaluation to date: 1 week ago in ER for same issue. U/S negative for DVT or cellulitis. Treatment to date: avoidance of offending activity.  Pressure sore: located on left buttuck, reports drainage a few days ago, family member has been applying "butt cream" to the area with some improvement. Denies bleeding, mildly tender. pt sits in recliner most of day, uses walker to walk in home very short distances.  Health Maintenance Due  Topic Date Due   TETANUS/TDAP  Never done    Past Medical History:  Diagnosis Date   Allergy    Anxiety    Arthritis    Blood transfusion without reported diagnosis    Breast cancer ([facility]) 07/12/2013   Invasive Mammary Carcinoma   DVT (deep vein thrombosis) in pregnancy    Hypertension    Hypothyroid    Metastatic cancer to bone ([place]) dx'd 12/2016   hip   Personal history of radiation therapy    Pneumonia    PONV (postoperative nausea and vomiting)    Radiation 11/21/13-01/07/14   Right Breast/Supraclavicular    Past Surgical History:  Procedure Laterality Date   BREAST LUMPECTOMY Left 2015   BREAST LUMPECTOMY WITH RADIOACTIVE SEED LOCALIZATION Right 09/09/2013   Procedure: BREAST LUMPECTOMY WITH RADIOACTIVE SEED LOCALIZATION WITH AXILLARY NODE EXCISION;  Surgeon: [name], MD;  Location: [facility];   Service: General;  Laterality: Right;   CYSTOSCOPY W/ URETERAL STENT PLACEMENT Right 01/07/2021   Procedure: CYSTOSCOPY WITH RETROGRADE PYELOGRAM/URETERAL STENT PLACEMENT;  Surgeon: Bjorn Loser, MD;  [facility];  Service: Urology;  Laterality: Right;   CYSTOSCOPY/URETEROSCOPY/HOLMIUM LASER/STENT PLACEMENT Right 02/08/2021   Procedure: CYSTOSCOPY, RIGHT URETEROSCOPY, RIGHT RETRGRADE PYELOGRAM, HOLMIUM LASER/STENT PLACEMENT;  Surgeon: Lucas Mallow, MD;  [facility];  Service: Urology;  Laterality: Right;   DENTAL SURGERY  04/19/2012   13 TEETH REMOVED   DILATION AND CURETTAGE OF UTERUS     IR RADIOLOGIST EVAL & MGMT  01/21/2021   ORIF PERIPROSTHETIC FRACTURE Left 01/31/2017   Procedure: REVISION and OPEN REDUCTION INTERNAL FIXATION (ORIF) PERIPROSTHETIC FRACTURE LEFT HIP;  Surgeon: Paralee Cancel, MD;  [facility];  Service: Orthopedics;  Laterality: Left;  120 mins   RE-EXCISION OF BREAST LUMPECTOMY Right 09/24/2013   Procedure: RE-EXCISION OF RIGHT BREAST LUMPECTOMY;  Surgeon: [name], MD;  Location: [facility];  Service: General;  Laterality: Right;   TOTAL HIP ARTHROPLASTY Left 01/16/2017   Procedure: TOTAL HIP ARTHROPLASTY POSTERIOR;  Surgeon: Paralee Cancel, MD;  [facility];  Service: Orthopedics;  Laterality: Left;    Outpatient Medications Prior to Visit  Medication Sig Dispense Refill   Cholecalciferol (VITAMIN D3) 5000 UNITS TABS Take 5,000 Units by mouth daily.      HYDROcodone-acetaminophen (NORCO/VICODIN) 5-325 MG tablet Take 1 tablet by mouth  3 (three) times daily with meals as needed for moderate pain. 90 tablet 0   Multiple Vitamin (MULTIVITAMIN WITH MINERALS) TABS tablet Take 1 tablet by mouth daily.     SYNTHROID 125 MCG tablet TAKE 1 TABLET BY MOUTH EVERY DAY BEFORE BREAKFAST 90 tablet 4   warfarin (COUMADIN) 5 MG tablet Take 5 mg by mouth daily. Take 5 mg by mouth Tuesday, Thursday, Saturday.     warfarin (COUMADIN) 7.5 MG  tablet Take 7.5 mg by mouth daily. Take 7.5 mg by mouth on Monday, Wednesday, Friday, and Sunday.     No facility-administered medications prior to visit.    Allergies  Allergen Reactions   Anesthetics, Amide Hypertension   Benadryl [Diphenhydramine Hcl] Other (See Comments)    Dizziness   Carbocaine [Mepivacaine Hcl] Hypertension   Codeine Other (See Comments)    Dizziness   Epinephrine Hypertension   Sulfa Antibiotics Other (See Comments)    dizziness   Diphenhydramine    Latex Other (See Comments)    Blisters in mouth   Penicillins Rash    Has patient had a PCN reaction causing immediate rash, facial/tongue/throat swelling, SOB or lightheadedness with hypotension: Unknown Has patient had a PCN reaction causing severe rash involving mucus membranes or skin necrosis: Unknown Has patient had a PCN reaction that required hospitalization: Unknown Has patient had a PCN reaction occurring within the last 10 years: Unknown If all of the above answers are "NO", then may proceed with Cephalosporin use.    Tramadol Other (See Comments)    Sedation.         Objective:    Physical Exam Vitals and nursing note reviewed.  Constitutional:      Appearance: Normal appearance. She is obese.  Cardiovascular:     Rate and Rhythm: Normal rate and regular rhythm.  Pulmonary:     Effort: Pulmonary effort is normal.     Breath sounds: Normal breath sounds.  Musculoskeletal:        General: Normal range of motion.     Right lower leg: No tenderness. Edema present.     Left lower leg: No tenderness. Edema present.     Right foot: Swelling present.     Left foot: Normal.       Legs:     Comments: 1-golf ball size area of fluid-like swelling behind L knee, non-tender 2- approx. 4-5cm diameter area of fluid anteriorly just below left knee.  Skin:    General: Skin is warm and dry.     Findings: Signs of injury (stage 1 bruising on left buttuck) present.       Neurological:     Mental  Status: She is alert.  Psychiatric:        Mood and Affect: Mood normal.        Behavior: Behavior normal.    BP (!) 154/87    Pulse 73    Temp 98.3 F (36.8 C) (Temporal)    Ht 5\' 4"  (1.626 m)    Wt 216 lb 14.9 oz (98.4 kg)    SpO2 97%    BMI 37.24 kg/m  Wt Readings from Last 3 Encounters:  06/03/21 216 lb 14.9 oz (98.4 kg)  04/27/21 217 lb (98.4 kg)  02/08/21 222 lb (100.7 kg)       Assessment & Plan:   Problem List Items Addressed This Visit       Other   Bilateral leg edema - Primary    pt reports edema in left  knee since 2 mos ago after a fall. notable edema in bilateral legs, and a soft, golf ball size swelling behind her left knee, and area of soft swelling approx. 2 inches distal, anterior knee; U/S 1 week ago neg for DVT. Starting Lasix, 40mg  qam x 3d, then 20mg  qam, pt dtr present and says they have a BSC at another house she can use to help avoid falls r/t rushing to urinate. Elevate legs even/above heart level as much as possible when resting. f/u with PCP & check labs if continues med.      Relevant Medications   furosemide (LASIX) 20 MG tablet   Pressure injury of left buttock, stage 1    pt has home health RN coming tomorrow to her home, advised having her assess this, and ask about a skin barrier cream and/or hydrocolloid to prevent further breakdown. Advised pt relieve pressure as much as possible.       Meds ordered this encounter  Medications   furosemide (LASIX) 20 MG tablet    Sig: Take 1 tablet (20 mg total) by mouth daily. TAKE 2 pills qam for 3 days, then 1 pill qam    Dispense:  30 tablet    Refill:  1    Order Specific Question:   Supervising Provider    Answer:   ANDY, CAMILLE L [[id number]]

## 2021-06-03 NOTE — Assessment & Plan Note (Signed)
pt has home health RN coming tomorrow to her home, advised having her assess this, and ask about a skin barrier cream and/or hydrocolloid to prevent further breakdown. Advised pt relieve pressure as much as possible.

## 2021-06-03 NOTE — Assessment & Plan Note (Addendum)
pt reports edema in left knee since 2 mos ago after a fall. notable edema in bilateral legs, and a soft, golf ball size swelling behind her left knee, and area of soft swelling approx. 2 inches distal, anterior knee; U/S 1 week ago neg for DVT. Starting Lasix, 40mg  qam x 3d, then 20mg  qam, pt dtr present and says they have a BSC at another house she can use to help avoid falls r/t rushing to urinate. Elevate legs even/above heart level as much as possible when resting. f/u with PCP & check labs if continues med.

## 2021-06-04 ENCOUNTER — Other Ambulatory Visit: Payer: Self-pay | Admitting: *Deleted

## 2021-06-04 DIAGNOSIS — S32049D Unspecified fracture of fourth lumbar vertebra, subsequent encounter for fracture with routine healing: Secondary | ICD-10-CM | POA: Diagnosis not present

## 2021-06-04 DIAGNOSIS — D649 Anemia, unspecified: Secondary | ICD-10-CM | POA: Diagnosis not present

## 2021-06-04 DIAGNOSIS — N3000 Acute cystitis without hematuria: Secondary | ICD-10-CM | POA: Diagnosis not present

## 2021-06-04 DIAGNOSIS — S22049D Unspecified fracture of fourth thoracic vertebra, subsequent encounter for fracture with routine healing: Secondary | ICD-10-CM | POA: Diagnosis not present

## 2021-06-04 DIAGNOSIS — S32059D Unspecified fracture of fifth lumbar vertebra, subsequent encounter for fracture with routine healing: Secondary | ICD-10-CM | POA: Diagnosis not present

## 2021-06-04 DIAGNOSIS — S22059D Unspecified fracture of T5-T6 vertebra, subsequent encounter for fracture with routine healing: Secondary | ICD-10-CM | POA: Diagnosis not present

## 2021-06-08 ENCOUNTER — Other Ambulatory Visit: Payer: Self-pay | Admitting: Hematology and Oncology

## 2021-06-08 ENCOUNTER — Other Ambulatory Visit: Payer: Self-pay | Admitting: Adult Health

## 2021-06-08 ENCOUNTER — Ambulatory Visit: Payer: Medicare Other | Admitting: Family Medicine

## 2021-06-08 DIAGNOSIS — S22049D Unspecified fracture of fourth thoracic vertebra, subsequent encounter for fracture with routine healing: Secondary | ICD-10-CM | POA: Diagnosis not present

## 2021-06-08 DIAGNOSIS — S32049D Unspecified fracture of fourth lumbar vertebra, subsequent encounter for fracture with routine healing: Secondary | ICD-10-CM | POA: Diagnosis not present

## 2021-06-08 DIAGNOSIS — S32059D Unspecified fracture of fifth lumbar vertebra, subsequent encounter for fracture with routine healing: Secondary | ICD-10-CM | POA: Diagnosis not present

## 2021-06-08 DIAGNOSIS — S22059D Unspecified fracture of T5-T6 vertebra, subsequent encounter for fracture with routine healing: Secondary | ICD-10-CM | POA: Diagnosis not present

## 2021-06-08 DIAGNOSIS — N3000 Acute cystitis without hematuria: Secondary | ICD-10-CM | POA: Diagnosis not present

## 2021-06-08 DIAGNOSIS — D649 Anemia, unspecified: Secondary | ICD-10-CM | POA: Diagnosis not present

## 2021-06-08 MED ORDER — HYDROCODONE-ACETAMINOPHEN 5-325 MG PO TABS: 1.0000 | ORAL_TABLET | Freq: Three times a day (TID) | ORAL | 0 refills | Status: DC | PRN

## 2021-06-08 NOTE — Progress Notes (Signed)
Refilling Hydrocodone on behalf of Dr. Chryl Heck due to [place] challenges. # 120 written instead of #90 per Dr. Rob Hickman recommendation. PMP aware reviewed, last fill 05/06/2021, no red flags noted.    Wilber Bihari, NP

## 2021-06-10 ENCOUNTER — Encounter: Payer: Self-pay | Admitting: Licensed Clinical Social Worker

## 2021-06-10 DIAGNOSIS — D649 Anemia, unspecified: Secondary | ICD-10-CM | POA: Diagnosis not present

## 2021-06-10 DIAGNOSIS — S22059D Unspecified fracture of T5-T6 vertebra, subsequent encounter for fracture with routine healing: Secondary | ICD-10-CM | POA: Diagnosis not present

## 2021-06-10 DIAGNOSIS — N3000 Acute cystitis without hematuria: Secondary | ICD-10-CM | POA: Diagnosis not present

## 2021-06-10 DIAGNOSIS — S32049D Unspecified fracture of fourth lumbar vertebra, subsequent encounter for fracture with routine healing: Secondary | ICD-10-CM | POA: Diagnosis not present

## 2021-06-10 DIAGNOSIS — S32059D Unspecified fracture of fifth lumbar vertebra, subsequent encounter for fracture with routine healing: Secondary | ICD-10-CM | POA: Diagnosis not present

## 2021-06-10 DIAGNOSIS — S22049D Unspecified fracture of fourth thoracic vertebra, subsequent encounter for fracture with routine healing: Secondary | ICD-10-CM | POA: Diagnosis not present

## 2021-06-10 NOTE — Progress Notes (Signed)
[facility] CSW Progress Note  [employer] representative spoke with patient by phone. Pt asked if CSW was "doing anything about the situation" with her son. Per last conversation, APS was following up with pt. Pt confirmed that they have been out to see her and called her recently as well. CSW urged pt to follow-up with APS as they have more ability to assist with this concern. Pt also asked about legal assistance- CSW reminded pt of Legal Aid phone number. Pt stated she will call.  Pt also asked about food- CSW will provide bag from food pantry at visit on Monday.    Christeen Douglas , LCSW

## 2021-06-14 ENCOUNTER — Inpatient Hospital Stay: Payer: Medicare Other | Attending: Oncology

## 2021-06-14 ENCOUNTER — Inpatient Hospital Stay: Payer: Medicare Other

## 2021-06-14 ENCOUNTER — Inpatient Hospital Stay (HOSPITAL_BASED_OUTPATIENT_CLINIC_OR_DEPARTMENT_OTHER): Payer: Medicare Other | Admitting: Hematology and Oncology

## 2021-06-14 ENCOUNTER — Encounter: Payer: Self-pay | Admitting: Hematology and Oncology

## 2021-06-14 ENCOUNTER — Other Ambulatory Visit: Payer: Self-pay

## 2021-06-14 VITALS — BP 170/72 | HR 77 | Temp 97.7°F | Resp 17 | Wt 215.0 lb

## 2021-06-14 DIAGNOSIS — Z170 Estrogen receptor positive status [ER+]: Secondary | ICD-10-CM | POA: Diagnosis not present

## 2021-06-14 DIAGNOSIS — M84659P Pathological fracture in other disease, hip, unspecified, subsequent encounter for fracture with malunion: Secondary | ICD-10-CM

## 2021-06-14 DIAGNOSIS — C50411 Malignant neoplasm of upper-outer quadrant of right female breast: Secondary | ICD-10-CM | POA: Insufficient documentation

## 2021-06-14 DIAGNOSIS — C7951 Secondary malignant neoplasm of bone: Secondary | ICD-10-CM

## 2021-06-14 DIAGNOSIS — M7989 Other specified soft tissue disorders: Secondary | ICD-10-CM | POA: Diagnosis not present

## 2021-06-14 DIAGNOSIS — M899 Disorder of bone, unspecified: Secondary | ICD-10-CM

## 2021-06-14 DIAGNOSIS — I82401 Acute embolism and thrombosis of unspecified deep veins of right lower extremity: Secondary | ICD-10-CM

## 2021-06-14 LAB — CBC WITH DIFFERENTIAL (CANCER CENTER ONLY)
Abs Immature Granulocytes: 0.02 10*3/uL (ref 0.00–0.07)
Basophils Absolute: 0.1 10*3/uL (ref 0.0–0.1)
Basophils Relative: 1 %
Eosinophils Absolute: 0.2 10*3/uL (ref 0.0–0.5)
Eosinophils Relative: 4 %
HCT: 38.7 % (ref 36.0–46.0)
Hemoglobin: 12.4 g/dL (ref 12.0–15.0)
Immature Granulocytes: 0 %
Lymphocytes Relative: 17 %
Lymphs Abs: 1.1 10*3/uL (ref 0.7–4.0)
MCH: 29 pg (ref 26.0–34.0)
MCHC: 32 g/dL (ref 30.0–36.0)
MCV: 90.6 fL (ref 80.0–100.0)
Monocytes Absolute: 1 10*3/uL (ref 0.1–1.0)
Monocytes Relative: 16 %
Neutro Abs: 3.9 10*3/uL (ref 1.7–7.7)
Neutrophils Relative %: 62 %
Platelet Count: 236 10*3/uL (ref 150–400)
RBC: 4.27 MIL/uL (ref 3.87–5.11)
RDW: 15.6 % — ABNORMAL HIGH (ref 11.5–15.5)
WBC Count: 6.3 10*3/uL (ref 4.0–10.5)
nRBC: 0 % (ref 0.0–0.2)

## 2021-06-14 LAB — CMP (CANCER CENTER ONLY)
ALT: 10 U/L (ref 0–44)
AST: 10 U/L — ABNORMAL LOW (ref 15–41)
Albumin: 4 g/dL (ref 3.5–5.0)
Alkaline Phosphatase: 104 U/L (ref 38–126)
Anion gap: 7 (ref 5–15)
BUN: 16 mg/dL (ref 8–23)
CO2: 28 mmol/L (ref 22–32)
Calcium: 10.8 mg/dL — ABNORMAL HIGH (ref 8.9–10.3)
Chloride: 105 mmol/L (ref 98–111)
Creatinine: 0.95 mg/dL (ref 0.44–1.00)
GFR, Estimated: >60 mL/min (ref >60)
Glucose, Bld: 104 mg/dL — ABNORMAL HIGH (ref 70–99)
Potassium: 3.6 mmol/L (ref 3.5–5.1)
Sodium: 140 mmol/L (ref 135–145)
Total Bilirubin: 0.4 mg/dL (ref 0.3–1.2)
Total Protein: 6.7 g/dL (ref 6.5–8.1)

## 2021-06-14 LAB — PROTIME-INR
INR: 1.6 — ABNORMAL HIGH (ref 0.8–1.2)
Prothrombin Time: 18.9 seconds — ABNORMAL HIGH (ref 11.4–15.2)

## 2021-06-14 MED ORDER — FULVESTRANT 250 MG/5ML IM SOSY
500.0000 mg | PREFILLED_SYRINGE | Freq: Once | INTRAMUSCULAR | Status: AC
  Administered 2021-06-14: 500 mg via INTRAMUSCULAR
  Filled 2021-06-14: qty 10
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
## 2021-06-14 NOTE — Patient Instructions (Signed)
Fulvestrant injection °What is this medication? °FULVESTRANT (ful VES trant) blocks the effects of estrogen. It is used to treat breast cancer. °This medicine may be used for other purposes; ask your health care provider or pharmacist if you have questions. °COMMON BRAND NAME(S): FASLODEX °What should I tell my care team before I take this medication? °They need to know if you have any of these conditions: °bleeding disorders °liver disease °low blood counts, like low white cell, platelet, or red cell counts °an unusual or allergic reaction to fulvestrant, other medicines, foods, dyes, or preservatives °pregnant or trying to get pregnant °breast-feeding °How should I use this medication? °This medicine is for injection into a muscle. It is usually given by a health care professional in a hospital or clinic setting. °Talk to your pediatrician regarding the use of this medicine in children. Special care may be needed. °Overdosage: If you think you have taken too much of this medicine contact a poison control center or emergency room at once. °NOTE: This medicine is only for you. Do not share this medicine with others. °What if I miss a dose? °It is important not to miss your dose. Call your doctor or health care professional if you are unable to keep an appointment. °What may interact with this medication? °medicines that treat or prevent blood clots like warfarin, enoxaparin, dalteparin, apixaban, dabigatran, and rivaroxaban °This list may not describe all possible interactions. Give your health care provider a list of all the medicines, herbs, non-prescription drugs, or dietary supplements you use. Also tell them if you smoke, drink alcohol, or use illegal drugs. Some items may interact with your medicine. °What should I watch for while using this medication? °Your condition will be monitored carefully while you are receiving this medicine. You will need important blood work done while you are taking this  medicine. °Do not become pregnant while taking this medicine or for at least 1 year after stopping it. Women of child-bearing potential will need to have a negative pregnancy test before starting this medicine. Women should inform their doctor if they wish to become pregnant or think they might be pregnant. There is a potential for serious side effects to an unborn child. Men should inform their doctors if they wish to father a child. This medicine may lower sperm counts. Talk to your health care professional or pharmacist for more information. Do not breast-feed an infant while taking this medicine or for 1 year after the last dose. °What side effects may I notice from receiving this medication? °Side effects that you should report to your doctor or health care professional as soon as possible: °allergic reactions like skin rash, itching or hives, swelling of the face, lips, or tongue °feeling faint or lightheaded, falls °pain, tingling, numbness, or weakness in the legs °signs and symptoms of infection like fever or chills; cough; flu-like symptoms; sore throat °vaginal bleeding °Side effects that usually do not require medical attention (report to your doctor or health care professional if they continue or are bothersome): °aches, pains °constipation °diarrhea °headache °hot flashes °nausea, vomiting °pain at site where injected °stomach pain °This list may not describe all possible side effects. Call your doctor for medical advice about side effects. You may report side effects to FDA at 1-800-FDA-1088. °Where should I keep my medication? °This drug is given in a hospital or clinic and will not be stored at home. °NOTE: This sheet is a summary. It may not cover all possible information. If you have   questions about this medicine, talk to your doctor, pharmacist, or health care provider. °© 2022 Elsevier/Gold Standard (2017-08-29 00:00:00) ° °

## 2021-06-15 DIAGNOSIS — N3000 Acute cystitis without hematuria: Secondary | ICD-10-CM | POA: Diagnosis not present

## 2021-06-15 DIAGNOSIS — D649 Anemia, unspecified: Secondary | ICD-10-CM | POA: Diagnosis not present

## 2021-06-15 DIAGNOSIS — S32049D Unspecified fracture of fourth lumbar vertebra, subsequent encounter for fracture with routine healing: Secondary | ICD-10-CM | POA: Diagnosis not present

## 2021-06-15 DIAGNOSIS — S32059D Unspecified fracture of fifth lumbar vertebra, subsequent encounter for fracture with routine healing: Secondary | ICD-10-CM | POA: Diagnosis not present

## 2021-06-15 DIAGNOSIS — S22049D Unspecified fracture of fourth thoracic vertebra, subsequent encounter for fracture with routine healing: Secondary | ICD-10-CM | POA: Diagnosis not present

## 2021-06-15 DIAGNOSIS — S22059D Unspecified fracture of T5-T6 vertebra, subsequent encounter for fracture with routine healing: Secondary | ICD-10-CM | POA: Diagnosis not present

## 2021-06-17 ENCOUNTER — Other Ambulatory Visit: Payer: Self-pay

## 2021-06-17 ENCOUNTER — Encounter: Payer: Self-pay | Admitting: Family Medicine

## 2021-06-17 ENCOUNTER — Ambulatory Visit (INDEPENDENT_AMBULATORY_CARE_PROVIDER_SITE_OTHER): Payer: Medicare Other | Admitting: Family Medicine

## 2021-06-17 VITALS — BP 150/92 | HR 88

## 2021-06-17 DIAGNOSIS — Z170 Estrogen receptor positive status [ER+]: Secondary | ICD-10-CM

## 2021-06-17 DIAGNOSIS — K219 Gastro-esophageal reflux disease without esophagitis: Secondary | ICD-10-CM

## 2021-06-17 DIAGNOSIS — I890 Lymphedema, not elsewhere classified: Secondary | ICD-10-CM | POA: Diagnosis not present

## 2021-06-17 DIAGNOSIS — C50411 Malignant neoplasm of upper-outer quadrant of right female breast: Secondary | ICD-10-CM | POA: Diagnosis not present

## 2021-06-17 DIAGNOSIS — S32059D Unspecified fracture of fifth lumbar vertebra, subsequent encounter for fracture with routine healing: Secondary | ICD-10-CM | POA: Diagnosis not present

## 2021-06-17 DIAGNOSIS — S22049D Unspecified fracture of fourth thoracic vertebra, subsequent encounter for fracture with routine healing: Secondary | ICD-10-CM | POA: Diagnosis not present

## 2021-06-17 DIAGNOSIS — J329 Chronic sinusitis, unspecified: Secondary | ICD-10-CM | POA: Diagnosis not present

## 2021-06-17 DIAGNOSIS — S32049D Unspecified fracture of fourth lumbar vertebra, subsequent encounter for fracture with routine healing: Secondary | ICD-10-CM | POA: Diagnosis not present

## 2021-06-17 DIAGNOSIS — S22059D Unspecified fracture of T5-T6 vertebra, subsequent encounter for fracture with routine healing: Secondary | ICD-10-CM | POA: Diagnosis not present

## 2021-06-17 DIAGNOSIS — R1310 Dysphagia, unspecified: Secondary | ICD-10-CM | POA: Diagnosis not present

## 2021-06-17 DIAGNOSIS — D649 Anemia, unspecified: Secondary | ICD-10-CM | POA: Diagnosis not present

## 2021-06-17 DIAGNOSIS — C801 Malignant (primary) neoplasm, unspecified: Secondary | ICD-10-CM

## 2021-06-17 DIAGNOSIS — N3000 Acute cystitis without hematuria: Secondary | ICD-10-CM | POA: Diagnosis not present

## 2021-06-17 MED ORDER — AZITHROMYCIN 250 MG PO TABS: ORAL_TABLET | ORAL | 0 refills | Status: AC

## 2021-06-17 MED ORDER — PANTOPRAZOLE SODIUM 40 MG PO TBEC: 40.0000 mg | DELAYED_RELEASE_TABLET | Freq: Every day | ORAL | 3 refills | Status: DC

## 2021-06-17 NOTE — Assessment & Plan Note (Signed)
No red flags. Symptoms are improving with conservative measures. She does not want to take lasix. Given her improvement with conservative measures alone, I think this is reasonable. She will hold on to her rx in case she has worsening symptoms.

## 2021-06-17 NOTE — Patient Outreach (Signed)
[facility]) Care Management  06/17/2021  Alicia Vasquez 20-Mar-1941 [id number]   MD referral received from Dr. Dimas Chyle for Care management services. Provider has embedded RN at practice sent [phone] for Care management services. Sent to Care guides for community and housing resources.  Thank you, [place] Care Management Assistant

## 2021-06-17 NOTE — Assessment & Plan Note (Signed)
Follows with oncology.  On chemo.

## 2021-06-17 NOTE — Progress Notes (Signed)
Alicia Vasquez is a 81 y.o. female who presents today for an office visit.  Assessment/Plan:  New/Acute Problems: Sinusitis High risk for infection given history of metastatic breast cancer on chemotherapy.  We will start Z-Pak.  Encouraged hydration.  She will let me know if not improving.  Discussed reasons to return to care.  Chronic Problems Addressed Today: Lymphedema No red flags. Symptoms are improving with conservative measures. She does not want to take lasix. Given her improvement with conservative measures alone, I think this is reasonable. She will hold on to her rx in case she has worsening symptoms.  Malignant neoplasm of upper-outer quadrant of right breast in female, estrogen receptor positive ([place]) Follows with oncology.  On chemo.  GERD (gastroesophageal reflux disease) She has had slight dysphagia recently.  We discussed referral to GI however she deferred for now.  We will start 4-week course of Protonix.  If symptoms persist she will need referral.  Discussed reasons return to care.   Poor social situation Will place referral to [facility].    Subjective:  HPI:  Patient here to follow up on leg edema. She saw a different provider in this office on 06/03/2020 for this issue. She had worsening swelling at that visit. She reports she fell about 2 months ago at nursing facility. Her another episode was weeks after the first fall. She was started on Lasix 20 at that time.  However, she notes she has not be taking her medication due to concern about potassium.  She still have some issue with swelling. She is keeping her leg elevated at night. This has helped with swelling.    She is having hard time eating food. She notes she feels like food is stuck in the esophagus.  This occurs occasionally.  Started a few weeks ago.  She does have a longstanding history of reflux that seems to be worsening.  This has been going on for couple of weeks. She does have burning sensation in  her abdomen. This been going on for a month.    She does have some sinus congestion and feel like she might have sinusitis. Started about a week. She has lost about 3 pounds in the past 1-2 months. She still have issue with weakness. She walked about 30 feet this morning. She uses wheelchair.  Patient is currently living in extended stay hotel.  She has had issues with her son who is also moved into her hotel room with her.  This has been a significant source of stress as well as financial burden.        Objective:  Physical Exam: BP (!) 150/92 (BP Location: Left Arm, Patient Position: Sitting)    Pulse 88    SpO2 96%   Gen: No acute distress, resting comfortably CV: Regular rate and rhythm with no murmurs appreciated Pulm: Normal work of breathing, clear to auscultation bilaterally with no crackles, wheezes, or rhonchi MSK: 1+ pitting edema to knee bilaterally Neuro: Grossly normal, moves all extremities Psych: Normal affect and thought content       I,Savera Zaman,acting as a scribe for Dimas Chyle, MD.,have documented all relevant documentation on the behalf of Dimas Chyle, MD,as directed by  Dimas Chyle, MD while in the presence of Dimas Chyle, MD.   I, Dimas Chyle, MD, have reviewed all documentation for this visit. The documentation on 06/17/21 for the exam, diagnosis, procedures, and orders are all accurate and complete.  Time Spent: 45 minutes of total time was  spent on the date of the encounter performing the following actions: chart review prior to seeing the patient including recent ED visits and visits with oncology, obtaining history, performing a medically necessary exam, counseling on the treatment plan, placing orders, and documenting in our EHR.    Algis Greenhouse. Jerline Pain, MD 06/17/2021 2:55 PM

## 2021-06-17 NOTE — Patient Instructions (Signed)
It was very nice to see you today!  Please start the azithromycin and protonix.  I will refer for [facility] care management.  Please come back in 3 months.  Come back sooner needed.   Take care, Dr Jerline Pain  PLEASE NOTE:  If you had any lab tests please let us know if you have not heard back within a few days. You may see your results on mychart before we have a chance to review them but we will give you a call once they are reviewed by [name]. If we ordered any referrals today, please let us know if you have not heard from their office within the next week.   Please try these tips to maintain a healthy lifestyle:  Eat at least 3 REAL meals and 1-2 snacks per day.  Aim for no more than 5 hours between eating.  If you eat breakfast, please do so within one hour of getting up.   Each meal should contain half fruits/vegetables, one quarter protein, and one quarter carbs (no bigger than a computer mouse)  Cut down on sweet beverages. This includes juice, soda, and sweet tea.   Drink at least 1 glass of water with each meal and aim for at least 8 glasses per day  Exercise at least 150 minutes every week.

## 2021-06-17 NOTE — Assessment & Plan Note (Signed)
She has had slight dysphagia recently.  We discussed referral to GI however she deferred for now.  We will start 4-week course of Protonix.  If symptoms persist she will need referral.  Discussed reasons return to care.

## 2021-06-22 ENCOUNTER — Encounter: Payer: Self-pay | Admitting: Podiatry

## 2021-06-22 ENCOUNTER — Other Ambulatory Visit: Payer: Self-pay

## 2021-06-22 ENCOUNTER — Ambulatory Visit (INDEPENDENT_AMBULATORY_CARE_PROVIDER_SITE_OTHER): Payer: Medicare Other | Admitting: Podiatry

## 2021-06-22 DIAGNOSIS — D649 Anemia, unspecified: Secondary | ICD-10-CM | POA: Diagnosis not present

## 2021-06-22 DIAGNOSIS — M79675 Pain in left toe(s): Secondary | ICD-10-CM

## 2021-06-22 DIAGNOSIS — D689 Coagulation defect, unspecified: Secondary | ICD-10-CM

## 2021-06-22 DIAGNOSIS — B351 Tinea unguium: Secondary | ICD-10-CM

## 2021-06-22 DIAGNOSIS — M79674 Pain in right toe(s): Secondary | ICD-10-CM | POA: Diagnosis not present

## 2021-06-22 DIAGNOSIS — S32059D Unspecified fracture of fifth lumbar vertebra, subsequent encounter for fracture with routine healing: Secondary | ICD-10-CM | POA: Diagnosis not present

## 2021-06-22 DIAGNOSIS — S32049D Unspecified fracture of fourth lumbar vertebra, subsequent encounter for fracture with routine healing: Secondary | ICD-10-CM | POA: Diagnosis not present

## 2021-06-22 DIAGNOSIS — S22059D Unspecified fracture of T5-T6 vertebra, subsequent encounter for fracture with routine healing: Secondary | ICD-10-CM | POA: Diagnosis not present

## 2021-06-22 DIAGNOSIS — N3000 Acute cystitis without hematuria: Secondary | ICD-10-CM | POA: Diagnosis not present

## 2021-06-22 DIAGNOSIS — S22049D Unspecified fracture of fourth thoracic vertebra, subsequent encounter for fracture with routine healing: Secondary | ICD-10-CM | POA: Diagnosis not present

## 2021-06-23 ENCOUNTER — Other Ambulatory Visit: Payer: Self-pay

## 2021-06-23 DIAGNOSIS — I10 Essential (primary) hypertension: Secondary | ICD-10-CM

## 2021-06-24 DIAGNOSIS — D649 Anemia, unspecified: Secondary | ICD-10-CM | POA: Diagnosis not present

## 2021-06-24 DIAGNOSIS — S22049D Unspecified fracture of fourth thoracic vertebra, subsequent encounter for fracture with routine healing: Secondary | ICD-10-CM | POA: Diagnosis not present

## 2021-06-24 DIAGNOSIS — S22059D Unspecified fracture of T5-T6 vertebra, subsequent encounter for fracture with routine healing: Secondary | ICD-10-CM | POA: Diagnosis not present

## 2021-06-24 DIAGNOSIS — S32059D Unspecified fracture of fifth lumbar vertebra, subsequent encounter for fracture with routine healing: Secondary | ICD-10-CM | POA: Diagnosis not present

## 2021-06-24 DIAGNOSIS — N3000 Acute cystitis without hematuria: Secondary | ICD-10-CM | POA: Diagnosis not present

## 2021-06-24 DIAGNOSIS — S32049D Unspecified fracture of fourth lumbar vertebra, subsequent encounter for fracture with routine healing: Secondary | ICD-10-CM | POA: Diagnosis not present

## 2021-06-29 NOTE — Progress Notes (Signed)
/  °  Subjective:  Patient ID: Alicia Vasquez, female    DOB: 16-Feb-1941,  MRN: [id number]  81 y.o. female presents at risk foot care with h/o clotting disorder and painful elongated mycotic toenails 1-5 bilaterally which are tender when wearing enclosed shoe gear. Pain is relieved with periodic professional debridement.  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  New problem(s): None   PCP is [name], MD , and last visit was 06/17/2021.  Allergies  Allergen Reactions   Anesthetics, Amide Hypertension   Benadryl [Diphenhydramine Hcl] Other (See Comments)    Dizziness   Carbocaine [Mepivacaine Hcl] Hypertension   Codeine Other (See Comments)    Dizziness   Epinephrine Hypertension   Sulfa Antibiotics Other (See Comments)    dizziness   Diphenhydramine    Latex Other (See Comments)    Blisters in mouth   Penicillins Rash    Has patient had a PCN reaction causing immediate rash, facial/tongue/throat swelling, SOB or lightheadedness with hypotension: Unknown Has patient had a PCN reaction causing severe rash involving mucus membranes or skin necrosis: Unknown Has patient had a PCN reaction that required hospitalization: Unknown Has patient had a PCN reaction occurring within the last 10 years: Unknown If all of the above answers are "NO", then may proceed with Cephalosporin use.    Tramadol Other (See Comments)    Sedation.     Review of Systems: Negative except as noted in the HPI.   Objective:  Vascular Examination: Faintly palpable pedal pulses. CFT < 3 seconds b/l. No edema. No pain with calf compression b/l. Skin temperature gradient WNL b/l.   Neurological Examination: Sensation grossly intact b/l with 10 gram monofilament. Vibratory sensation intact b/l.   Dermatological Examination: Pedal skin with normal turgor, texture and tone b/l. Toenails 1-5 b/l thick, discolored, elongated with subungual debris and pain on dorsal palpation. No hyperkeratotic lesions  noted b/l.   Musculoskeletal Examination: Muscle strength 5/5 to b/l LE. No pain, crepitus or joint limitation noted with ROM bilateral LE. No gross bony deformities bilaterally.  Radiographs: None  Assessment:   1. Pain due to onychomycosis of toenails of both feet   2. Blood clotting disorder (HCC)    Plan:  -Mycotic toenails 1-5 bilaterally were debrided in length and girth with sterile nail nippers and dremel without incident. -Patient/POA to call should there be question/concern in the interim.  Return in about 3 months (around 09/20/2021).  Marzetta Board, DPM

## 2021-06-30 ENCOUNTER — Encounter (HOSPITAL_COMMUNITY): Payer: Medicare Other

## 2021-07-06 ENCOUNTER — Other Ambulatory Visit (HOSPITAL_COMMUNITY): Payer: Medicare Other

## 2021-07-12 ENCOUNTER — Inpatient Hospital Stay: Payer: Medicare Other

## 2021-07-12 ENCOUNTER — Telehealth: Payer: Self-pay | Admitting: Adult Health

## 2021-07-12 ENCOUNTER — Other Ambulatory Visit: Payer: Self-pay | Admitting: Hematology and Oncology

## 2021-07-12 DIAGNOSIS — Z170 Estrogen receptor positive status [ER+]: Secondary | ICD-10-CM

## 2021-07-12 NOTE — Telephone Encounter (Signed)
Sch per 2/10 inbasket, left msg

## 2021-07-13 ENCOUNTER — Telehealth: Payer: Self-pay

## 2021-07-13 ENCOUNTER — Inpatient Hospital Stay: Payer: Medicare Other | Attending: Oncology | Admitting: Adult Health

## 2021-07-13 ENCOUNTER — Inpatient Hospital Stay: Payer: Medicare Other

## 2021-07-13 DIAGNOSIS — Z170 Estrogen receptor positive status [ER+]: Secondary | ICD-10-CM | POA: Insufficient documentation

## 2021-07-13 DIAGNOSIS — C50411 Malignant neoplasm of upper-outer quadrant of right female breast: Secondary | ICD-10-CM | POA: Insufficient documentation

## 2021-07-13 DIAGNOSIS — Z5111 Encounter for antineoplastic chemotherapy: Secondary | ICD-10-CM | POA: Insufficient documentation

## 2021-07-13 NOTE — Telephone Encounter (Signed)
Called and spoke to pt, pt states she was unable to make it to her appointment due to illness.  Pt has been seen by her PCP and states she is feeling better after antibiotics but out of caution did not want to come in today.  Pt agreed to be rescheduled for next week.  Scheduling message sent.

## 2021-07-14 ENCOUNTER — Telehealth: Payer: Self-pay | Admitting: Adult Health

## 2021-07-14 NOTE — Telephone Encounter (Signed)
Sch per 2/14 inbasket, unable to leave message

## 2021-07-14 NOTE — Telephone Encounter (Signed)
Sch per 2.15 inbasket, call pt and friend gladys unable to leave message, calendar mailed

## 2021-07-19 ENCOUNTER — Inpatient Hospital Stay: Payer: Medicare Other

## 2021-07-19 ENCOUNTER — Other Ambulatory Visit: Payer: Self-pay

## 2021-07-19 ENCOUNTER — Encounter: Payer: Self-pay | Admitting: Adult Health

## 2021-07-19 ENCOUNTER — Inpatient Hospital Stay (HOSPITAL_BASED_OUTPATIENT_CLINIC_OR_DEPARTMENT_OTHER): Payer: Medicare Other | Admitting: Adult Health

## 2021-07-19 VITALS — BP 171/81 | HR 87 | Temp 97.9°F | Resp 16 | Ht 64.0 in | Wt 213.0 lb

## 2021-07-19 DIAGNOSIS — Z170 Estrogen receptor positive status [ER+]: Secondary | ICD-10-CM

## 2021-07-19 DIAGNOSIS — M84659P Pathological fracture in other disease, hip, unspecified, subsequent encounter for fracture with malunion: Secondary | ICD-10-CM

## 2021-07-19 DIAGNOSIS — Z5111 Encounter for antineoplastic chemotherapy: Secondary | ICD-10-CM | POA: Diagnosis not present

## 2021-07-19 DIAGNOSIS — C50411 Malignant neoplasm of upper-outer quadrant of right female breast: Secondary | ICD-10-CM

## 2021-07-19 DIAGNOSIS — C7951 Secondary malignant neoplasm of bone: Secondary | ICD-10-CM

## 2021-07-19 DIAGNOSIS — M899 Disorder of bone, unspecified: Secondary | ICD-10-CM

## 2021-07-19 LAB — CBC WITH DIFFERENTIAL/PLATELET
Abs Immature Granulocytes: 0.02 10*3/uL (ref 0.00–0.07)
Basophils Absolute: 0.1 10*3/uL (ref 0.0–0.1)
Basophils Relative: 1 %
Eosinophils Absolute: 0.2 10*3/uL (ref 0.0–0.5)
Eosinophils Relative: 3 %
HCT: 40.2 % (ref 36.0–46.0)
Hemoglobin: 12.8 g/dL (ref 12.0–15.0)
Immature Granulocytes: 0 %
Lymphocytes Relative: 22 %
Lymphs Abs: 1.6 10*3/uL (ref 0.7–4.0)
MCH: 28.4 pg (ref 26.0–34.0)
MCHC: 31.8 g/dL (ref 30.0–36.0)
MCV: 89.1 fL (ref 80.0–100.0)
Monocytes Absolute: 1 10*3/uL (ref 0.1–1.0)
Monocytes Relative: 13 %
Neutro Abs: 4.5 10*3/uL (ref 1.7–7.7)
Neutrophils Relative %: 61 %
Platelets: 288 10*3/uL (ref 150–400)
RBC: 4.51 MIL/uL (ref 3.87–5.11)
RDW: 14.5 % (ref 11.5–15.5)
WBC: 7.5 10*3/uL (ref 4.0–10.5)
nRBC: 0 % (ref 0.0–0.2)

## 2021-07-19 LAB — COMPREHENSIVE METABOLIC PANEL
ALT: 10 U/L (ref 0–44)
AST: 11 U/L — ABNORMAL LOW (ref 15–41)
Albumin: 3.8 g/dL (ref 3.5–5.0)
Alkaline Phosphatase: 108 U/L (ref 38–126)
Anion gap: 4 — ABNORMAL LOW (ref 5–15)
BUN: 14 mg/dL (ref 8–23)
CO2: 31 mmol/L (ref 22–32)
Calcium: 10.8 mg/dL — ABNORMAL HIGH (ref 8.9–10.3)
Chloride: 107 mmol/L (ref 98–111)
Creatinine, Ser: 0.99 mg/dL (ref 0.44–1.00)
GFR, Estimated: 57 mL/min — ABNORMAL LOW (ref >60)
Glucose, Bld: 104 mg/dL — ABNORMAL HIGH (ref 70–99)
Potassium: 3.8 mmol/L (ref 3.5–5.1)
Sodium: 142 mmol/L (ref 135–145)
Total Bilirubin: 0.3 mg/dL (ref 0.3–1.2)
Total Protein: 6.7 g/dL (ref 6.5–8.1)

## 2021-07-19 MED ORDER — FULVESTRANT 250 MG/5ML IM SOSY
500.0000 mg | PREFILLED_SYRINGE | Freq: Once | INTRAMUSCULAR | Status: AC
  Administered 2021-07-19: 500 mg via INTRAMUSCULAR

## 2021-07-19 MED ORDER — HYDROCODONE-ACETAMINOPHEN 5-325 MG PO TABS: 1.0000 | ORAL_TABLET | Freq: Four times a day (QID) | ORAL | 0 refills | Status: DC | PRN

## 2021-07-19 NOTE — Patient Instructions (Signed)
Fulvestrant injection °What is this medication? °FULVESTRANT (ful VES trant) blocks the effects of estrogen. It is used to treat breast cancer. °This medicine may be used for other purposes; ask your health care provider or pharmacist if you have questions. °COMMON BRAND NAME(S): FASLODEX °What should I tell my care team before I take this medication? °They need to know if you have any of these conditions: °bleeding disorders °liver disease °low blood counts, like low white cell, platelet, or red cell counts °an unusual or allergic reaction to fulvestrant, other medicines, foods, dyes, or preservatives °pregnant or trying to get pregnant °breast-feeding °How should I use this medication? °This medicine is for injection into a muscle. It is usually given by a health care professional in a hospital or clinic setting. °Talk to your pediatrician regarding the use of this medicine in children. Special care may be needed. °Overdosage: If you think you have taken too much of this medicine contact a poison control center or emergency room at once. °NOTE: This medicine is only for you. Do not share this medicine with others. °What if I miss a dose? °It is important not to miss your dose. Call your doctor or health care professional if you are unable to keep an appointment. °What may interact with this medication? °medicines that treat or prevent blood clots like warfarin, enoxaparin, dalteparin, apixaban, dabigatran, and rivaroxaban °This list may not describe all possible interactions. Give your health care provider a list of all the medicines, herbs, non-prescription drugs, or dietary supplements you use. Also tell them if you smoke, drink alcohol, or use illegal drugs. Some items may interact with your medicine. °What should I watch for while using this medication? °Your condition will be monitored carefully while you are receiving this medicine. You will need important blood work done while you are taking this  medicine. °Do not become pregnant while taking this medicine or for at least 1 year after stopping it. Women of child-bearing potential will need to have a negative pregnancy test before starting this medicine. Women should inform their doctor if they wish to become pregnant or think they might be pregnant. There is a potential for serious side effects to an unborn child. Men should inform their doctors if they wish to father a child. This medicine may lower sperm counts. Talk to your health care professional or pharmacist for more information. Do not breast-feed an infant while taking this medicine or for 1 year after the last dose. °What side effects may I notice from receiving this medication? °Side effects that you should report to your doctor or health care professional as soon as possible: °allergic reactions like skin rash, itching or hives, swelling of the face, lips, or tongue °feeling faint or lightheaded, falls °pain, tingling, numbness, or weakness in the legs °signs and symptoms of infection like fever or chills; cough; flu-like symptoms; sore throat °vaginal bleeding °Side effects that usually do not require medical attention (report to your doctor or health care professional if they continue or are bothersome): °aches, pains °constipation °diarrhea °headache °hot flashes °nausea, vomiting °pain at site where injected °stomach pain °This list may not describe all possible side effects. Call your doctor for medical advice about side effects. You may report side effects to FDA at 1-800-FDA-1088. °Where should I keep my medication? °This drug is given in a hospital or clinic and will not be stored at home. °NOTE: This sheet is a summary. It may not cover all possible information. If you have   questions about this medicine, talk to your doctor, pharmacist, or health care provider. °© 2022 Elsevier/Gold Standard (2017-08-29 00:00:00) ° °
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
## 2021-07-20 ENCOUNTER — Telehealth: Payer: Self-pay | Admitting: Adult Health

## 2021-07-20 NOTE — Telephone Encounter (Signed)
Scheduled appointment per 2/20 los. Called patient to update her on the changes made to her appointment. Left message. Also called the patient's friend [name]) to update her of the changes made to the patients appointment. Unable to leave message with [name] due to the mailbox being full. Patient will be mailed an updated calendar.

## 2021-07-28 ENCOUNTER — Ambulatory Visit (HOSPITAL_COMMUNITY)
Admission: RE | Admit: 2021-07-28 | Discharge: 2021-07-28 | Disposition: A | Discharge status: Home / Self Care | Payer: Medicare Other | Source: Ambulatory Visit | Attending: Hematology and Oncology | Admitting: Hematology and Oncology

## 2021-07-28 ENCOUNTER — Other Ambulatory Visit: Payer: Self-pay | Admitting: Hematology and Oncology

## 2021-07-28 ENCOUNTER — Encounter (HOSPITAL_COMMUNITY)
Admission: RE | Admit: 2021-07-28 | Discharge: 2021-07-28 | Disposition: A | Discharge status: Home / Self Care | Payer: Medicare Other | Source: Ambulatory Visit | Attending: Hematology and Oncology | Admitting: Hematology and Oncology

## 2021-07-28 ENCOUNTER — Encounter (HOSPITAL_COMMUNITY): Payer: Self-pay

## 2021-07-28 ENCOUNTER — Other Ambulatory Visit: Payer: Self-pay

## 2021-07-28 ENCOUNTER — Other Ambulatory Visit: Payer: Self-pay | Admitting: *Deleted

## 2021-07-28 DIAGNOSIS — D7389 Other diseases of spleen: Secondary | ICD-10-CM | POA: Diagnosis not present

## 2021-07-28 DIAGNOSIS — Z170 Estrogen receptor positive status [ER+]: Secondary | ICD-10-CM

## 2021-07-28 DIAGNOSIS — K429 Umbilical hernia without obstruction or gangrene: Secondary | ICD-10-CM | POA: Diagnosis not present

## 2021-07-28 DIAGNOSIS — M545 Low back pain, unspecified: Secondary | ICD-10-CM | POA: Diagnosis not present

## 2021-07-28 DIAGNOSIS — C50411 Malignant neoplasm of upper-outer quadrant of right female breast: Secondary | ICD-10-CM | POA: Diagnosis not present

## 2021-07-28 DIAGNOSIS — M170 Bilateral primary osteoarthritis of knee: Secondary | ICD-10-CM | POA: Diagnosis not present

## 2021-07-28 DIAGNOSIS — C7951 Secondary malignant neoplasm of bone: Secondary | ICD-10-CM | POA: Insufficient documentation

## 2021-07-28 DIAGNOSIS — Z853 Personal history of malignant neoplasm of breast: Secondary | ICD-10-CM | POA: Diagnosis not present

## 2021-07-28 DIAGNOSIS — R0781 Pleurodynia: Secondary | ICD-10-CM | POA: Diagnosis not present

## 2021-07-28 DIAGNOSIS — K449 Diaphragmatic hernia without obstruction or gangrene: Secondary | ICD-10-CM | POA: Diagnosis not present

## 2021-07-28 DIAGNOSIS — I2510 Atherosclerotic heart disease of native coronary artery without angina pectoris: Secondary | ICD-10-CM | POA: Diagnosis not present

## 2021-07-28 DIAGNOSIS — C50919 Malignant neoplasm of unspecified site of unspecified female breast: Secondary | ICD-10-CM | POA: Diagnosis not present

## 2021-07-28 MED ORDER — SODIUM CHLORIDE (PF) 0.9 % IJ SOLN
INTRAMUSCULAR | Status: AC
  Filled 2021-07-28: qty 50

## 2021-07-28 MED ORDER — TECHNETIUM TC 99M MEDRONATE IV KIT
20.0000 | PACK | Freq: Once | INTRAVENOUS | Status: AC
  Administered 2021-07-28: 20 via INTRAVENOUS

## 2021-07-28 MED ORDER — HYDROCODONE-ACETAMINOPHEN 5-325 MG PO TABS: 1.0000 | ORAL_TABLET | Freq: Four times a day (QID) | ORAL | 0 refills | Status: DC | PRN

## 2021-07-28 MED ORDER — IOHEXOL 300 MG/ML  SOLN
100.0000 mL | Freq: Once | INTRAMUSCULAR | Status: AC | PRN
  Administered 2021-07-28: 100 mL via INTRAVENOUS

## 2021-08-03 ENCOUNTER — Encounter: Payer: Self-pay | Admitting: Oncology

## 2021-08-04 ENCOUNTER — Telehealth: Payer: Self-pay | Admitting: *Deleted

## 2021-08-04 NOTE — Telephone Encounter (Addendum)
-----   Message from Benay Pike, MD sent at 07/30/2021  6:36 PM EST ----- ?Can you let her know the bone scan results please. No metastatic disease, but they seem to be concerned about the hip arthroplasty, can she talk to her ortho, ? ?Thanks ? ?This RN spoke with patient per bone scan - she states overall function and pain " about the same"- she does state she has fallen 2x on hip replacement side. ? ?This RN informed her above issue is best evaluated by the orthopedist- verified as Dr Alvan Dame- and that she should follow up with him for best outcome. ? ?Alicia Vasquez verbalized understanding. ? ?This RN faxed reading of bone scan to Dr Aurea Graff office. ?

## 2021-08-09 ENCOUNTER — Ambulatory Visit: Payer: Medicare Other

## 2021-08-09 ENCOUNTER — Other Ambulatory Visit: Payer: Medicare Other

## 2021-08-10 ENCOUNTER — Telehealth: Payer: Self-pay

## 2021-08-10 NOTE — Telephone Encounter (Signed)
Pt called and LVM requesting to r/s most recent missed appt d/t needing to take her son to the ED. Message sent to scheduling for them to call pt to r/s ?

## 2021-08-13 ENCOUNTER — Other Ambulatory Visit: Payer: Self-pay | Admitting: *Deleted

## 2021-08-13 MED ORDER — WARFARIN SODIUM 5 MG PO TABS: 5.0000 mg | ORAL_TABLET | Freq: Every day | ORAL | 3 refills | Status: DC

## 2021-08-13 MED ORDER — WARFARIN SODIUM 7.5 MG PO TABS: 7.5000 mg | ORAL_TABLET | Freq: Every day | ORAL | 3 refills | Status: DC

## 2021-08-16 ENCOUNTER — Other Ambulatory Visit: Payer: Medicare Other

## 2021-08-16 ENCOUNTER — Ambulatory Visit: Payer: Medicare Other

## 2021-08-18 ENCOUNTER — Ambulatory Visit (HOSPITAL_COMMUNITY)
Admission: RE | Admit: 2021-08-18 | Discharge: 2021-08-18 | Disposition: A | Discharge status: Home / Self Care | Payer: Medicare Other | Source: Ambulatory Visit | Attending: Adult Health | Admitting: Adult Health

## 2021-08-18 ENCOUNTER — Inpatient Hospital Stay: Payer: Medicare Other

## 2021-08-18 ENCOUNTER — Telehealth: Payer: Self-pay

## 2021-08-18 ENCOUNTER — Other Ambulatory Visit: Payer: Self-pay

## 2021-08-18 ENCOUNTER — Inpatient Hospital Stay: Payer: Medicare Other | Attending: Oncology

## 2021-08-18 ENCOUNTER — Inpatient Hospital Stay (HOSPITAL_BASED_OUTPATIENT_CLINIC_OR_DEPARTMENT_OTHER): Payer: Medicare Other | Admitting: Adult Health

## 2021-08-18 VITALS — BP 162/65 | HR 78 | Temp 98.1°F | Resp 16 | Ht 64.0 in | Wt 216.0 lb

## 2021-08-18 DIAGNOSIS — Z7901 Long term (current) use of anticoagulants: Secondary | ICD-10-CM

## 2021-08-18 DIAGNOSIS — Z170 Estrogen receptor positive status [ER+]: Secondary | ICD-10-CM | POA: Insufficient documentation

## 2021-08-18 DIAGNOSIS — T1490XA Injury, unspecified, initial encounter: Secondary | ICD-10-CM

## 2021-08-18 DIAGNOSIS — E038 Other specified hypothyroidism: Secondary | ICD-10-CM | POA: Diagnosis not present

## 2021-08-18 DIAGNOSIS — C50411 Malignant neoplasm of upper-outer quadrant of right female breast: Secondary | ICD-10-CM

## 2021-08-18 DIAGNOSIS — Z86718 Personal history of other venous thrombosis and embolism: Secondary | ICD-10-CM | POA: Insufficient documentation

## 2021-08-18 DIAGNOSIS — Z800 Family history of malignant neoplasm of digestive organs: Secondary | ICD-10-CM

## 2021-08-18 DIAGNOSIS — I10 Essential (primary) hypertension: Secondary | ICD-10-CM | POA: Diagnosis not present

## 2021-08-18 DIAGNOSIS — I4891 Unspecified atrial fibrillation: Secondary | ICD-10-CM

## 2021-08-18 DIAGNOSIS — E039 Hypothyroidism, unspecified: Secondary | ICD-10-CM

## 2021-08-18 DIAGNOSIS — Z79899 Other long term (current) drug therapy: Secondary | ICD-10-CM | POA: Insufficient documentation

## 2021-08-18 DIAGNOSIS — M84659P Pathological fracture in other disease, hip, unspecified, subsequent encounter for fracture with malunion: Secondary | ICD-10-CM

## 2021-08-18 DIAGNOSIS — Z5111 Encounter for antineoplastic chemotherapy: Secondary | ICD-10-CM | POA: Diagnosis not present

## 2021-08-18 DIAGNOSIS — Z8042 Family history of malignant neoplasm of prostate: Secondary | ICD-10-CM

## 2021-08-18 DIAGNOSIS — M899 Disorder of bone, unspecified: Secondary | ICD-10-CM

## 2021-08-18 LAB — CBC WITH DIFFERENTIAL/PLATELET
Abs Immature Granulocytes: 0.02 10*3/uL (ref 0.00–0.07)
Basophils Absolute: 0.1 10*3/uL (ref 0.0–0.1)
Basophils Relative: 1 %
Eosinophils Absolute: 0.3 10*3/uL (ref 0.0–0.5)
Eosinophils Relative: 4 %
HCT: 42.3 % (ref 36.0–46.0)
Hemoglobin: 13.3 g/dL (ref 12.0–15.0)
Immature Granulocytes: 0 %
Lymphocytes Relative: 24 %
Lymphs Abs: 1.7 10*3/uL (ref 0.7–4.0)
MCH: 28.2 pg (ref 26.0–34.0)
MCHC: 31.4 g/dL (ref 30.0–36.0)
MCV: 89.6 fL (ref 80.0–100.0)
Monocytes Absolute: 1.1 10*3/uL — ABNORMAL HIGH (ref 0.1–1.0)
Monocytes Relative: 15 %
Neutro Abs: 4.1 10*3/uL (ref 1.7–7.7)
Neutrophils Relative %: 56 %
Platelets: 246 10*3/uL (ref 150–400)
RBC: 4.72 MIL/uL (ref 3.87–5.11)
RDW: 14.5 % (ref 11.5–15.5)
WBC: 7.2 10*3/uL (ref 4.0–10.5)
nRBC: 0 % (ref 0.0–0.2)

## 2021-08-18 LAB — COMPREHENSIVE METABOLIC PANEL
ALT: 11 U/L (ref 0–44)
AST: 12 U/L — ABNORMAL LOW (ref 15–41)
Albumin: 4 g/dL (ref 3.5–5.0)
Alkaline Phosphatase: 109 U/L (ref 38–126)
Anion gap: 5 (ref 5–15)
BUN: 17 mg/dL (ref 8–23)
CO2: 30 mmol/L (ref 22–32)
Calcium: 11.2 mg/dL — ABNORMAL HIGH (ref 8.9–10.3)
Chloride: 104 mmol/L (ref 98–111)
Creatinine, Ser: 0.91 mg/dL (ref 0.44–1.00)
GFR, Estimated: >60 mL/min (ref >60)
Glucose, Bld: 88 mg/dL (ref 70–99)
Potassium: 4.2 mmol/L (ref 3.5–5.1)
Sodium: 139 mmol/L (ref 135–145)
Total Bilirubin: 0.4 mg/dL (ref 0.3–1.2)
Total Protein: 7 g/dL (ref 6.5–8.1)

## 2021-08-18 LAB — TSH: TSH: 2.302 u[IU]/mL (ref 0.308–3.960)

## 2021-08-18 LAB — T4, FREE: Free T4: 1.11 ng/dL (ref 0.61–1.12)

## 2021-08-18 LAB — PROTIME-INR
INR: 1.2 (ref 0.8–1.2)
Prothrombin Time: 14.7 seconds (ref 11.4–15.2)

## 2021-08-18 MED ORDER — FULVESTRANT 250 MG/5ML IM SOSY
500.0000 mg | PREFILLED_SYRINGE | Freq: Once | INTRAMUSCULAR | Status: AC
  Administered 2021-08-18: 500 mg via INTRAMUSCULAR
  Filled 2021-08-18: qty 10

## 2021-08-18 NOTE — Telephone Encounter (Signed)
-----   Message from Gardenia Phlegm, NP sent at 08/18/2021 12:59 PM EDT ----- ?Please confirm with patient how she is taking Warfarin and that she is taking it as prescribed.  (Prescribed is how she should be taking it).   ? ?We need to recheck inr in one week ?----- Message ----- ?From: Interface, Lab In [place] ?Sent: 08/18/2021  12:24 PM EDT ?To: Gardenia Phlegm, NP ? ? ?

## 2021-08-18 NOTE — Telephone Encounter (Signed)
Incoming call received from pt, explained to pt that her INR is low.  Pt reports she was previously taking '2mg'$  tabs of coumadin instead of '5mg'$ .  Pt verbalized over the phone that she alternates doses of '5mg'$  and 7.'5mg'$  MWFSunday, and TTHSat.  Pt will return on 3/30 for recheck of PT INR.  Pt was in route to the pharmacy while talking with me and will be checking her meds at pickup to ensure they are '5mg'$  tabs and not '2mg'$  tabs.   ?

## 2021-08-22 ENCOUNTER — Encounter: Payer: Self-pay | Admitting: Oncology

## 2021-08-22 ENCOUNTER — Encounter: Payer: Self-pay | Admitting: Adult Health

## 2021-08-22 NOTE — Progress Notes (Signed)
?[place]  ?[phone] [phone]  ? ? ? ?ID: Alicia Vasquez OB: 12-09-1940  MR#: [id number]  GGE#:[id number] ? ?Patient Care Team: ?[name], MD as PCP - General (Family Medicine) ?Philemon Kingdom, MD as Consulting Physician (Internal Medicine) ?Caprice Renshaw, MD as Referring Physician (Internal Medicine) ?[name], DDS (Inactive) as Consulting Physician (Dentistry) ?Wallene Huh, DPM as Consulting Physician (Podiatry) ?Bjorn Loser, MD as Consulting Physician (Urology) ?Benay Pike, MD as Consulting Physician (Hematology and Oncology) ?Paralee Cancel, MD as Consulting Physician (Orthopedic Surgery) ?OTHER MD: ? ? ?CHIEF COMPLAINT: Estrogen receptor positive breast cancer ? ?CURRENT TREATMENT:  Fulvestrant; warfarin ? ? ?INTERVAL HISTORY: ?Alicia Vasquez returns today for follow-up and treatment of her metastatic estrogen receptor positive breast cancer.  She continues on Faslodex with good tolerance.  She is taking warfarin as instructed and congruent with the EMR. Her INR is pending.   ? ?She underwent scans on 07/28/2021 that showed no evidence of bony metastasis and increased radiotracer activity along the femoral component of the left hip arthroplasty which may reflect prosthesis loosening, bony remodeling, or occult fracture, radiographic correlation recommended, and stable degenerative changes of the bilateral shoulders and knees.  CT chest, abdomen, pelvis showed stable compression deformities at T12, L4 and L5, no definite osseous metastasis, aortic atherosclerosis, coronary artery calcifications.   ? ?She continues to use hydrocodone 4 tablets a day for her left leg pain.  This manages her pain and she denies any increased need for medication.  She is concerned about a recent scan that noted questionable increase in uptake over her left femur and she wants to know if her leg is broken.  ? ? ?Review of Systems  ?Constitutional:  Positive for  fatigue. Negative for appetite change, chills, fever and unexpected weight change.  ?HENT:   Negative for hearing loss, lump/mass and trouble swallowing.   ?Eyes:  Negative for eye problems and icterus.  ?Respiratory:  Negative for chest tightness, cough and shortness of breath.   ?Cardiovascular:  Negative for chest pain, leg swelling and palpitations.  ?Gastrointestinal:  Negative for abdominal distention, abdominal pain, constipation, diarrhea, nausea and vomiting.  ?Endocrine: Negative for hot flashes.  ?Genitourinary:  Negative for difficulty urinating.   ?Musculoskeletal:  Negative for arthralgias.  ?Skin:  Negative for itching and rash.  ?Neurological:  Negative for dizziness, extremity weakness, headaches and numbness.  ?Hematological:  Negative for adenopathy. Does not bruise/bleed easily.  ?Psychiatric/Behavioral:  Negative for depression. The patient is not nervous/anxious.   ? ? ?Rest of the pertinent 10 point ROS reviewed and negative. ? ?BREAST CANCER HISTORY: ?From doctor [name]'s intake node 07/24/2013: ? ?"81 y.o. female. Who presented with SOB and had a CT chest performed that revealed a right breast mass. Mammogram/ultrsound on 2/13 showed a mass in the 11 o'clock position in the right breast measuring 1.5 cm. Also noted was a right axillary LN measuring 1.6 cm. MRI not performed. Biopsy of mass and lymph done. 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 Lymph node + for metastatic carcinoma." ? ?[On 09/09/2013 the patient underwent right lumpectomy and sentinel lymph node sampling. This showed ([id number]) multifocal invasive ductal carcinoma, grade 1, the largest lesion measuring 1.8 cm, the second lesion 1.2 cm. One of 4 sentinel lymph nodes was positive, with extracapsular extension. Margins were positive. HER-2 was repeated and was again negative. Further surgery 09/16/2013  obtained clear margins. ? ?Her subsequent history is as detailed below ? ? ?PAST MEDICAL HISTORY: ?Past Medical History:  ?Diagnosis Date  ? Allergy   ? Anxiety   ? Arthritis   ? Blood transfusion without reported diagnosis   ? Breast cancer ([place]) 07/12/2013  ? Invasive Mammary Carcinoma  ? DVT (deep vein thrombosis) in pregnancy   ? Hypertension   ? Hypothyroid   ? Metastatic cancer to bone [facility]) dx'd 12/2016  ? hip  ? Personal history of radiation therapy   ? Pneumonia   ? PONV (postoperative nausea and vomiting)   ? Radiation 11/21/13-01/07/14  ? Right Breast/Supraclavicular  ? ? ?PAST SURGICAL HISTORY: ?Past Surgical History:  ?Procedure Laterality Date  ? BREAST LUMPECTOMY Left 2015  ? BREAST LUMPECTOMY WITH RADIOACTIVE SEED LOCALIZATION Right 09/09/2013  ? Procedure: BREAST LUMPECTOMY WITH RADIOACTIVE SEED LOCALIZATION WITH AXILLARY NODE EXCISION;  Surgeon: [name], MD;  Location: [facility];  Service: General;  Laterality: Right;  ? CYSTOSCOPY W/ URETERAL STENT PLACEMENT Right 01/07/2021  ? Procedure: CYSTOSCOPY WITH RETROGRADE PYELOGRAM/URETERAL STENT PLACEMENT;  Surgeon: Bjorn Loser, MD;  Location: [facility];  Service: Urology;  Laterality: Right;  ? CYSTOSCOPY/URETEROSCOPY/HOLMIUM LASER/STENT PLACEMENT Right 02/08/2021  ? Procedure: CYSTOSCOPY, RIGHT URETEROSCOPY, RIGHT RETRGRADE PYELOGRAM, HOLMIUM LASER/STENT PLACEMENT;  Surgeon: Lucas Mallow, MD;  Location: [facility];  Service: Urology;  Laterality: Right;  ? DENTAL SURGERY  04/19/2012  ? 13 TEETH REMOVED  ? DILATION AND CURETTAGE OF UTERUS    ? IR RADIOLOGIST EVAL & MGMT  01/21/2021  ? ORIF PERIPROSTHETIC FRACTURE Left 01/31/2017  ? Procedure: REVISION and OPEN REDUCTION INTERNAL FIXATION (ORIF) PERIPROSTHETIC FRACTURE LEFT HIP;  Surgeon: Paralee Cancel, MD;  Location: [facility];  Service: Orthopedics;  Laterality: Left;  120 mins  ? RE-EXCISION OF BREAST LUMPECTOMY Right 09/24/2013  ? Procedure: RE-EXCISION OF RIGHT BREAST LUMPECTOMY;   Surgeon: [name], MD;  Location: [facility];  Service: General;  Laterality: Right;  ? TOTAL HIP ARTHROPLASTY Left 01/16/2017  ? Procedure: TOTAL HIP ARTHROPLASTY POSTERIOR;  Surgeon: Paralee Cancel, MD;  Location: [facility];  Service: Orthopedics;  Laterality: Left;  ? ? ?FAMILY HISTORY ?Family History  ?Problem Relation Age of Onset  ? Heart disease Brother   ? Colon cancer Brother   ? Prostate cancer Brother   ? the patient's father died at the age of 68 after an automobile accident. The patient's mother died at the age of 32. She was a Marine scientist here in [name] in the old [facility]. She was infected with polio and was confined to a wheelchair for a good part of her life. She eventually died of pneumonia. The patient had one brother, who died with prostate cancer. She had no sisters. There is no history of breast or ovarian cancer in the family. ? ? ?GYNECOLOGIC HISTORY:  ?Menarche age 7, first live birth age 70, the patient is GX P1. She went through the change of life at age 16. She did not take hormone replacement  ? ? 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 She is widowed. Currently she is staying with her friend [name], 75 years old, who is a retired Radio producer.  Berthe tells me [name] daughter has recently left her husband, put her children in foster care, and moved in with them.  The patient's son [name] lived in [place] but currently he lives in [place] only home.  He lost  his wife in 2008, is not currently employed although he occasionally does odd jobs, Jamilex says.. The patient tells me if [name] dies she would be moving back into her own home with her son. The patient has no grandchildren. She is a Tourist information centre manager but currently attends a [employer] with her friend [name] ?  ? ADVANCED DIRECTIVES: Not in place ? ? ?HEALTH MAINTENANCE: ?Social History  ? ?Tobacco Use  ? Smoking status: Never  ?  Smokeless tobacco: Never  ?Vaping Use  ? Vaping Use: Never used  ?Substance Use Topics  ? Alcohol use: No  ?  Alcohol/week: 0.0 standard drinks  ? Drug use: No  ? ? ? Colonoscopy: Never ? PAP: ? Bone density: 04

## 2021-08-25 ENCOUNTER — Telehealth: Payer: Self-pay | Admitting: *Deleted

## 2021-08-25 ENCOUNTER — Inpatient Hospital Stay: Payer: Medicare Other

## 2021-08-25 DIAGNOSIS — Z7901 Long term (current) use of anticoagulants: Secondary | ICD-10-CM | POA: Diagnosis not present

## 2021-08-25 DIAGNOSIS — Z170 Estrogen receptor positive status [ER+]: Secondary | ICD-10-CM | POA: Diagnosis not present

## 2021-08-25 DIAGNOSIS — Z5111 Encounter for antineoplastic chemotherapy: Secondary | ICD-10-CM | POA: Diagnosis not present

## 2021-08-25 DIAGNOSIS — Z86718 Personal history of other venous thrombosis and embolism: Secondary | ICD-10-CM | POA: Diagnosis not present

## 2021-08-25 DIAGNOSIS — Z79899 Other long term (current) drug therapy: Secondary | ICD-10-CM | POA: Diagnosis not present

## 2021-08-25 DIAGNOSIS — C50411 Malignant neoplasm of upper-outer quadrant of right female breast: Secondary | ICD-10-CM | POA: Diagnosis not present

## 2021-08-25 DIAGNOSIS — T1490XA Injury, unspecified, initial encounter: Secondary | ICD-10-CM

## 2021-08-25 LAB — CBC WITH DIFFERENTIAL/PLATELET
Abs Immature Granulocytes: 0.02 10*3/uL (ref 0.00–0.07)
Basophils Absolute: 0.1 10*3/uL (ref 0.0–0.1)
Basophils Relative: 1 %
Eosinophils Absolute: 0.3 10*3/uL (ref 0.0–0.5)
Eosinophils Relative: 3 %
HCT: 40.7 % (ref 36.0–46.0)
Hemoglobin: 13.1 g/dL (ref 12.0–15.0)
Immature Granulocytes: 0 %
Lymphocytes Relative: 20 %
Lymphs Abs: 1.6 10*3/uL (ref 0.7–4.0)
MCH: 28.4 pg (ref 26.0–34.0)
MCHC: 32.2 g/dL (ref 30.0–36.0)
MCV: 88.3 fL (ref 80.0–100.0)
Monocytes Absolute: 0.9 10*3/uL (ref 0.1–1.0)
Monocytes Relative: 11 %
Neutro Abs: 5.2 10*3/uL (ref 1.7–7.7)
Neutrophils Relative %: 65 %
Platelets: 268 10*3/uL (ref 150–400)
RBC: 4.61 MIL/uL (ref 3.87–5.11)
RDW: 14.1 % (ref 11.5–15.5)
WBC: 8.2 10*3/uL (ref 4.0–10.5)
nRBC: 0 % (ref 0.0–0.2)

## 2021-08-25 LAB — PROTIME-INR
INR: 1.3 — ABNORMAL HIGH (ref 0.8–1.2)
Prothrombin Time: 16 seconds — ABNORMAL HIGH (ref 11.4–15.2)

## 2021-08-25 LAB — COMPREHENSIVE METABOLIC PANEL
ALT: 10 U/L (ref 0–44)
AST: 11 U/L — ABNORMAL LOW (ref 15–41)
Albumin: 3.9 g/dL (ref 3.5–5.0)
Alkaline Phosphatase: 107 U/L (ref 38–126)
Anion gap: 5 (ref 5–15)
BUN: 18 mg/dL (ref 8–23)
CO2: 28 mmol/L (ref 22–32)
Calcium: 10.9 mg/dL — ABNORMAL HIGH (ref 8.9–10.3)
Chloride: 105 mmol/L (ref 98–111)
Creatinine, Ser: 0.81 mg/dL (ref 0.44–1.00)
GFR, Estimated: >60 mL/min (ref >60)
Glucose, Bld: 95 mg/dL (ref 70–99)
Potassium: 4.5 mmol/L (ref 3.5–5.1)
Sodium: 138 mmol/L (ref 135–145)
Total Bilirubin: 0.3 mg/dL (ref 0.3–1.2)
Total Protein: 7 g/dL (ref 6.5–8.1)

## 2021-08-25 MED ORDER — WARFARIN SODIUM 5 MG PO TABS: ORAL_TABLET | ORAL | 0 refills | Status: DC

## 2021-08-25 MED ORDER — WARFARIN SODIUM 2.5 MG PO TABS: ORAL_TABLET | ORAL | 0 refills | Status: DC

## 2021-08-25 NOTE — Telephone Encounter (Signed)
INR 1.3  ? ?Pt unable to give a good account of exactly how much coumadin she has been taking and she did not bring her bottles with her. ? ?She states she was taking 2 tablets of what she thought was 5 mg dose but then noticed it was 2.5 mg- this was the only bottle she had in her room to take until her friend went by the pharmacy and picked up the 5 mg bottle late Saturday. ? ?Per review with [facility]/NP recommendation given for [facility] to take 7.5 mg Tuesday/thursdsay/sat and Sunday and 5 mg on Monday Wednesday Friday. ? ?Above instructions given to pt in writing and in the presence of her friend- [name]. ? ?Pt is to return as scheduled on 4/18. ? ?No further needs at this time. ? ?Of note pt has on MAR 7.5 mg coumadin and 5 mg coumadin per 3/17 refills. ? ?This RN called her pharmacy and was informed 2.5 mg coumadin dispensed and picked up 3/3 and then on 3/24 prescription filled for the 5 mg and picked up on 3/25. ? ?Pt has on file a prescription for 7.5 mg but it has not been filled. ? ?This RN stated to not fill the 7.5 mg tablets at this time due to pt has not been taking medication properly and was given written instructions with the doses she has on hand. ? ?

## 2021-08-26 ENCOUNTER — Other Ambulatory Visit: Payer: Medicare Other

## 2021-09-06 ENCOUNTER — Other Ambulatory Visit: Payer: Self-pay | Admitting: Hematology and Oncology

## 2021-09-06 ENCOUNTER — Ambulatory Visit: Payer: Medicare Other

## 2021-09-06 DIAGNOSIS — C7951 Secondary malignant neoplasm of bone: Secondary | ICD-10-CM

## 2021-09-06 DIAGNOSIS — Z170 Estrogen receptor positive status [ER+]: Secondary | ICD-10-CM

## 2021-09-06 MED ORDER — HYDROCODONE-ACETAMINOPHEN 5-325 MG PO TABS: 1.0000 | ORAL_TABLET | Freq: Four times a day (QID) | ORAL | 0 refills | Status: DC | PRN

## 2021-09-07 DIAGNOSIS — H2513 Age-related nuclear cataract, bilateral: Secondary | ICD-10-CM | POA: Diagnosis not present

## 2021-09-09 ENCOUNTER — Other Ambulatory Visit: Payer: Self-pay | Admitting: *Deleted

## 2021-09-09 MED ORDER — WARFARIN SODIUM 2.5 MG PO TABS: ORAL_TABLET | ORAL | 3 refills | Status: DC

## 2021-09-13 ENCOUNTER — Ambulatory Visit: Payer: Medicare Other

## 2021-09-13 ENCOUNTER — Ambulatory Visit (INDEPENDENT_AMBULATORY_CARE_PROVIDER_SITE_OTHER): Payer: Medicare Other

## 2021-09-13 ENCOUNTER — Other Ambulatory Visit: Payer: Medicare Other

## 2021-09-13 VITALS — BP 142/82 | HR 71 | Temp 98.1°F | Wt 213.0 lb

## 2021-09-13 DIAGNOSIS — Z0000 Encounter for general adult medical examination without abnormal findings: Secondary | ICD-10-CM

## 2021-09-13 DIAGNOSIS — Z599 Problem related to housing and economic circumstances, unspecified: Secondary | ICD-10-CM

## 2021-09-13 NOTE — Patient Instructions (Signed)
Alicia Vasquez , ?Thank you for taking time to come for your Medicare Wellness Visit. I appreciate your ongoing commitment to your health goals. Please review the following plan we discussed and let me know if I can assist you in the future.  ? ?Screening recommendations/referrals: ?Colonoscopy: No longer required  ?Mammogram: Done 02/23/21  ?Bone Density: Done 09/20/16 ?Recommended yearly ophthalmology/optometry visit for glaucoma screening and checkup ?Recommended yearly dental visit for hygiene and checkup ? ?Vaccinations: ?Influenza vaccine: Discontinued  ?Pneumococcal vaccine: Up to date ?Tdap vaccine: Discontinued  ?Shingles vaccine: Shingrix discussed. Please contact your pharmacy for coverage information.    ?Covid-19:Declined  ? ?Advanced directives: Advance directive discussed with you today. Even though you declined this today please call our office should you change your mind and we can give you the proper paperwork for you to fill out. ? ?Conditions/risks identified: None at this time  ? ?Next appointment: Follow up in one year for your annual wellness visit  ? ? ?Preventive Care 21 Years and Older, Female ?Preventive care refers to lifestyle choices and visits with your health care provider that can promote health and wellness. ?What does preventive care include? ?A yearly physical exam. This is also called an annual well check. ?Dental exams once or twice a year. ?Routine eye exams. Ask your health care provider how often you should have your eyes checked. ?Personal lifestyle choices, including: ?Daily care of your teeth and gums. ?Regular physical activity. ?Eating a healthy diet. ?Avoiding tobacco and drug use. ?Limiting alcohol use. ?Practicing safe sex. ?Taking low-dose aspirin every day. ?Taking vitamin and mineral supplements as recommended by your health care provider. ?What happens during an annual well check? ?The services and screenings done by your health care provider during your annual well  check will depend on your age, overall health, lifestyle risk factors, and family history of disease. ?Counseling  ?Your health care provider may ask you questions about your: ?Alcohol use. ?Tobacco use. ?Drug use. ?Emotional well-being. ?Home and relationship well-being. ?Sexual activity. ?Eating habits. ?History of falls. ?Memory and ability to understand (cognition). ?Work and work Statistician. ?Reproductive health. ?Screening  ?You may have the following tests or measurements: ?Height, weight, and BMI. ?Blood pressure. ?Lipid and cholesterol levels. These may be checked every 5 years, or more frequently if you are over 60 years old. ?Skin check. ?Lung cancer screening. You may have this screening every year starting at age 62 if you have a 30-pack-year history of smoking and currently smoke or have quit within the past 15 years. ?Fecal occult blood test (FOBT) of the stool. You may have this test every year starting at age 64. ?Flexible sigmoidoscopy or colonoscopy. You may have a sigmoidoscopy every 5 years or a colonoscopy every 10 years starting at age 58. ?Hepatitis C blood test. ?Hepatitis B blood test. ?Sexually transmitted disease (STD) testing. ?Diabetes screening. This is done by checking your blood sugar (glucose) after you have not eaten for a while (fasting). You may have this done every 1-3 years. ?Bone density scan. This is done to screen for osteoporosis. You may have this done starting at age 14. ?Mammogram. This may be done every 1-2 years. Talk to your health care provider about how often you should have regular mammograms. ?Talk with your health care provider about your test results, treatment options, and if necessary, the need for more tests. ?Vaccines  ?Your health care provider may recommend certain vaccines, such as: ?Influenza vaccine. This is recommended every year. ?Tetanus, diphtheria, and acellular  pertussis (Tdap, Td) vaccine. You may need a Td booster every 10 years. ?Zoster  vaccine. You may need this after age 20. ?Pneumococcal 13-valent conjugate (PCV13) vaccine. One dose is recommended after age 66. ?Pneumococcal polysaccharide (PPSV23) vaccine. One dose is recommended after age 19. ?Talk to your health care provider about which screenings and vaccines you need and how often you need them. ?This information is not intended to replace advice given to you by your health care provider. Make sure you discuss any questions you have with your health care provider. ?Document Released: 06/12/2015 Document Revised: 02/03/2016 Document Reviewed: 03/17/2015 ?Elsevier Interactive Patient Education ? 2017 [place]. ? ?Fall Prevention in the Home ?Falls can cause injuries. They can happen to people of all ages. There are many things you can do to make your home safe and to help prevent falls. ?What can I do on the outside of my home? ?Regularly fix the edges of walkways and driveways and fix any cracks. ?Remove anything that might make you trip as you walk through a door, such as a raised step or threshold. ?Trim any bushes or trees on the path to your home. ?Use bright outdoor lighting. ?Clear any walking paths of anything that might make someone trip, such as rocks or tools. ?Regularly check to see if handrails are loose or broken. Make sure that both sides of any steps have handrails. ?Any raised decks and porches should have guardrails on the edges. ?Have any leaves, snow, or ice cleared regularly. ?Use sand or salt on walking paths during winter. ?Clean up any spills in your garage right away. This includes oil or grease spills. ?What can I do in the bathroom? ?Use night lights. ?Install grab bars by the toilet and in the tub and shower. Do not use towel bars as grab bars. ?Use non-skid mats or decals in the tub or shower. ?If you need to sit down in the shower, use a plastic, non-slip stool. ?Keep the floor dry. Clean up any water that spills on the floor as soon as it happens. ?Remove  soap buildup in the tub or shower regularly. ?Attach bath mats securely with double-sided non-slip rug tape. ?Do not have throw rugs and other things on the floor that can make you trip. ?What can I do in the bedroom? ?Use night lights. ?Make sure that you have a light by your bed that is easy to reach. ?Do not use any sheets or blankets that are too big for your bed. They should not hang down onto the floor. ?Have a firm chair that has side arms. You can use this for support while you get dressed. ?Do not have throw rugs and other things on the floor that can make you trip. ?What can I do in the kitchen? ?Clean up any spills right away. ?Avoid walking on wet floors. ?Keep items that you use a lot in easy-to-reach places. ?If you need to reach something above you, use a strong step stool that has a grab bar. ?Keep electrical cords out of the way. ?Do not use floor polish or wax that makes floors slippery. If you must use wax, use non-skid floor wax. ?Do not have throw rugs and other things on the floor that can make you trip. ?What can I do with my stairs? ?Do not leave any items on the stairs. ?Make sure that there are handrails on both sides of the stairs and use them. Fix handrails that are broken or loose. Make sure that handrails  are as long as the stairways. ?Check any carpeting to make sure that it is firmly attached to the stairs. Fix any carpet that is loose or worn. ?Avoid having throw rugs at the top or bottom of the stairs. If you do have throw rugs, attach them to the floor with carpet tape. ?Make sure that you have a light switch at the top of the stairs and the bottom of the stairs. If you do not have them, ask someone to add them for you. ?What else can I do to help prevent falls? ?Wear shoes that: ?Do not have high heels. ?Have rubber bottoms. ?Are comfortable and fit you well. ?Are closed at the toe. Do not wear sandals. ?If you use a stepladder: ?Make sure that it is fully opened. Do not climb a  closed stepladder. ?Make sure that both sides of the stepladder are locked into place. ?Ask someone to hold it for you, if possible. ?Clearly mark and make sure that you can see: ?Any grab bars or handrails. ?F

## 2021-09-13 NOTE — Progress Notes (Signed)
? ?Subjective:  ? Alicia Vasquez is a 81 y.o. female who presents for Medicare Annual (Subsequent) preventive examination. ? ?Review of Systems    ? ?Cardiac Risk Factors include: advanced age (>26mn, >>66women);hypertension;obesity (BMI >30kg/m2) ? ?   ?Objective:  ?  ?Today's Vitals  ? 09/13/21 1251  ?BP: (!) 142/82  ?Pulse: 71  ?Temp: 98.1 ?F (36.7 ?C)  ?SpO2: 97%  ?Weight: 213 lb (96.6 kg)  ? ?Body mass index is 36.56 kg/m?. ? ? ?  09/13/2021  ?  1:04 PM 05/29/2021  ?  1:56 PM 02/08/2021  ?  7:59 AM 01/05/2021  ?  3:07 AM 01/04/2021  ? 10:47 PM 09/07/2020  ?  1:02 PM 07/09/2020  ? 11:32 AM  ?Advanced Directives  ?Does Patient Have a Medical Advance Directive? No No No Yes Yes No No  ?Type of ATheatre stage managerof Attorney     ?Does patient want to make changes to medical advance directive?    No - Patient declined     ?Would patient like information on creating a medical advance directive? No - Patient declined No - Patient declined No - Patient declined   Yes (MAU/Ambulatory/Procedural Areas - Information given) No - Patient declined  ? ? ?Current Medications (verified) ?Outpatient Encounter Medications as of 09/13/2021  ?Medication Sig  ? Cholecalciferol (VITAMIN D3) 5000 UNITS TABS Take 5,000 Units by mouth daily.   ? HYDROcodone-acetaminophen (NORCO/VICODIN) 5-325 MG tablet Take 1 tablet by mouth every 6 (six) hours as needed for moderate pain.  ? SYNTHROID 125 MCG tablet TAKE 1 TABLET BY MOUTH EVERY DAY BEFORE BREAKFAST  ? warfarin (COUMADIN) 2.5 MG tablet Pt to use with 5 mg tab on Tues/Thur/Sat and Sun for dose of 7.5 mg  ? warfarin (COUMADIN) 5 MG tablet Take 5 mg ONLY on Mon/Wed/Fri and then take with 2.5 mg coumadin on Tues/Thur/Sat/Sun  ? ?No facility-administered encounter medications on file as of 09/13/2021.  ? ? ?Allergies (verified) ?Anesthetics, amide; Benadryl [diphenhydramine hcl]; Carbocaine [mepivacaine hcl]; Codeine; Epinephrine; Sulfa antibiotics; Diphenhydramine; Latex;  Penicillins; and Tramadol  ? ?History: ?Past Medical History:  ?Diagnosis Date  ? Allergy   ? Anxiety   ? Arthritis   ? Blood transfusion without reported diagnosis   ? Breast cancer ([facility] 07/12/2013  ? Invasive Mammary Carcinoma  ? DVT (deep vein thrombosis) in pregnancy   ? Hypertension   ? Hypothyroid   ? Metastatic cancer to bone ([facility] dx'd 12/2016  ? hip  ? Personal history of radiation therapy   ? Pneumonia   ? PONV (postoperative nausea and vomiting)   ? Radiation 11/21/13-01/07/14  ? Right Breast/Supraclavicular  ? ?Past Surgical History:  ?Procedure Laterality Date  ? BREAST LUMPECTOMY Left 2015  ? BREAST LUMPECTOMY WITH RADIOACTIVE SEED LOCALIZATION Right 09/09/2013  ? Procedure: BREAST LUMPECTOMY WITH RADIOACTIVE SEED LOCALIZATION WITH AXILLARY NODE EXCISION;  Surgeon: MRolm Bookbinder MD;  [place] [facility]  Service: General;  Laterality: Right;  ? CYSTOSCOPY W/ URETERAL STENT PLACEMENT Right 01/07/2021  ? Procedure: CYSTOSCOPY WITH RETROGRADE PYELOGRAM/URETERAL STENT PLACEMENT;  Surgeon: MBjorn Loser MD;  [facility];  Service: Urology;  Laterality: Right;  ? CYSTOSCOPY/URETEROSCOPY/HOLMIUM LASER/STENT PLACEMENT Right 02/08/2021  ? Procedure: CYSTOSCOPY, RIGHT URETEROSCOPY, RIGHT RETRGRADE PYELOGRAM, HOLMIUM LASER/STENT PLACEMENT;  Surgeon: BLucas Mallow MD;  [facility];  Service: Urology;  Laterality: Right;  ? DENTAL SURGERY  04/19/2012  ? 13 TEETH REMOVED  ? DILATION AND CURETTAGE OF UTERUS    ?  IR RADIOLOGIST EVAL & MGMT  01/21/2021  ? ORIF PERIPROSTHETIC FRACTURE Left 01/31/2017  ? Procedure: REVISION and OPEN REDUCTION INTERNAL FIXATION (ORIF) PERIPROSTHETIC FRACTURE LEFT HIP;  Surgeon: Paralee Cancel, MD;  [facility];  Service: Orthopedics;  Laterality: Left;  120 mins  ? RE-EXCISION OF BREAST LUMPECTOMY Right 09/24/2013  ? Procedure: RE-EXCISION OF RIGHT BREAST LUMPECTOMY;  Surgeon: [name], MD;  Location: [place];  Service:  General;  Laterality: Right;  ? TOTAL HIP ARTHROPLASTY Left 01/16/2017  ? Procedure: TOTAL HIP ARTHROPLASTY POSTERIOR;  Surgeon: Paralee Cancel, MD;  [facility];  Service: Orthopedics;  Laterality: Left;  ? ?Family History  ?Problem Relation Age of Onset  ? Heart disease Brother   ? Colon cancer Brother   ? Prostate cancer Brother   ? ?Social History  ? ?Socioeconomic History  ? Marital status: Widowed  ?  Spouse name: Not on file  ? Number of children: 1  ? Years of education: Not on file  ? Highest education level: Not on file  ?Occupational History  ? Not on file  ?Tobacco Use  ? Smoking status: Never  ? Smokeless tobacco: Never  ?Vaping Use  ? Vaping Use: Never used  ?Substance and Sexual Activity  ? Alcohol use: No  ?  Alcohol/week: 0.0 standard drinks  ? Drug use: No  ? Sexual activity: Never  ?Other Topics Concern  ? Not on file  ?Social History Narrative  ? Exercise: yard work.  ? ?Social Determinants of Health  ? ?Financial Resource Strain: Low Risk   ? Difficulty of Paying Living Expenses: Not hard at all  ?Food Insecurity: No Food Insecurity  ? Worried About Charity fundraiser in the Last Year: Never true  ? Ran Out of Food in the Last Year: Never true  ?Transportation Needs: No Transportation Needs  ? Lack of Transportation (Medical): No  ? Lack of Transportation (Non-Medical): No  ?Physical Activity: Inactive  ? Days of Exercise per Week: 0 days  ? Minutes of Exercise per Session: 0 min  ?Stress: No Stress Concern Present  ? Feeling of Stress : Only a little  ?Social Connections: Socially Isolated  ? Frequency of Communication with Friends and Family: More than three times a week  ? Frequency of Social Gatherings with Friends and Family: Three times a week  ? Attends Religious Services: Never  ? Active Member of Clubs or Organizations: No  ? Attends Archivist Meetings: Never  ? Marital Status: Widowed  ? ? ?Tobacco Counseling ?Counseling given: Not Answered ? ? ?Clinical  Intake: ? ?Pre-visit preparation completed: Yes ? ?Pain : No/denies pain ? ?  ? ?BMI - recorded: 36.56 ?Nutritional Status: BMI > 30  Obese ?Nutritional Risks: None ?Diabetes: No ? ?How often do you need to have someone help you when you read instructions, pamphlets, or other written materials from your doctor or pharmacy?: 1 - Never ? ?Diabetic?no ? ?Interpreter Needed?: No ? ?Information entered by :: Charlott Rakes, LPN ? ? ?Activities of Daily Living ? ?  09/13/2021  ?  1:06 PM 02/08/2021  ?  8:08 AM  ?In your present state of health, do you have any difficulty performing the following activities:  ?Hearing? 1 1  ?Comment HOH   ?Vision? 0 0  ?Difficulty concentrating or making decisions? 0 0  ?Walking or climbing stairs? 1 1  ?Dressing or bathing? 0 1  ?Comment assistance  at times   ?Doing errands, shopping? 0   ?  Preparing Food and eating ? N   ?Using the Toilet? N   ?In the past six months, have you accidently leaked urine? Y   ?Comment wears a brief   ?Do you have problems with loss of bowel control? N   ?Managing your Medications? N   ?Managing your Finances? N   ?Housekeeping or managing your Housekeeping? N   ? ? ?Patient Care Team: ?[name], MD as PCP - General (Family Medicine) ?Philemon Kingdom, MD as Consulting Physician (Internal Medicine) ?Caprice Renshaw, MD as Referring Physician (Internal Medicine) ?[name], DDS (Inactive) as Consulting Physician (Dentistry) ?Wallene Huh, DPM as Consulting Physician (Podiatry) ?Bjorn Loser, MD as Consulting Physician (Urology) ?Benay Pike, MD as Consulting Physician (Hematology and Oncology) ?Paralee Cancel, MD as Consulting Physician (Orthopedic Surgery) ? ?Indicate any recent Medical Services you may have received from other than [facility] providers in the past year (date may be approximate). ? ?   ?Assessment:  ? This is a routine wellness examination for [facility]. ? ?Hearing/Vision screen ?Hearing Screening - Comments:: HOH  ?Vision  Screening - Comments:: Pt follows up with Dr Valetta Close for annual eye exams  ? ?Dietary issues and exercise activities discussed: ?Current Exercise Habits: The patient does not participate in regular exercise at present ? ? Goals Addr

## 2021-09-14 ENCOUNTER — Inpatient Hospital Stay: Payer: Medicare Other | Attending: Oncology

## 2021-09-14 ENCOUNTER — Inpatient Hospital Stay: Payer: Medicare Other

## 2021-09-14 ENCOUNTER — Telehealth: Payer: Self-pay | Admitting: *Deleted

## 2021-09-14 ENCOUNTER — Other Ambulatory Visit: Payer: Self-pay

## 2021-09-14 ENCOUNTER — Other Ambulatory Visit: Payer: Self-pay | Admitting: *Deleted

## 2021-09-14 VITALS — BP 164/75 | HR 81 | Temp 97.6°F | Resp 17

## 2021-09-14 DIAGNOSIS — Z170 Estrogen receptor positive status [ER+]: Secondary | ICD-10-CM

## 2021-09-14 DIAGNOSIS — Z86718 Personal history of other venous thrombosis and embolism: Secondary | ICD-10-CM | POA: Diagnosis not present

## 2021-09-14 DIAGNOSIS — M899 Disorder of bone, unspecified: Secondary | ICD-10-CM

## 2021-09-14 DIAGNOSIS — Z5111 Encounter for antineoplastic chemotherapy: Secondary | ICD-10-CM | POA: Insufficient documentation

## 2021-09-14 DIAGNOSIS — Z7901 Long term (current) use of anticoagulants: Secondary | ICD-10-CM

## 2021-09-14 DIAGNOSIS — C50411 Malignant neoplasm of upper-outer quadrant of right female breast: Secondary | ICD-10-CM | POA: Diagnosis not present

## 2021-09-14 DIAGNOSIS — M25561 Pain in right knee: Secondary | ICD-10-CM

## 2021-09-14 DIAGNOSIS — M84659P Pathological fracture in other disease, hip, unspecified, subsequent encounter for fracture with malunion: Secondary | ICD-10-CM

## 2021-09-14 LAB — PROTIME-INR
INR: 2.3 — ABNORMAL HIGH (ref 0.8–1.2)
Prothrombin Time: 25.3 seconds — ABNORMAL HIGH (ref 11.4–15.2)

## 2021-09-14 LAB — CBC WITH DIFFERENTIAL/PLATELET
Abs Immature Granulocytes: 0.02 10*3/uL (ref 0.00–0.07)
Basophils Absolute: 0.1 10*3/uL (ref 0.0–0.1)
Basophils Relative: 1 %
Eosinophils Absolute: 0.3 10*3/uL (ref 0.0–0.5)
Eosinophils Relative: 3 %
HCT: 40.5 % (ref 36.0–46.0)
Hemoglobin: 13.2 g/dL (ref 12.0–15.0)
Immature Granulocytes: 0 %
Lymphocytes Relative: 19 %
Lymphs Abs: 1.6 10*3/uL (ref 0.7–4.0)
MCH: 28.5 pg (ref 26.0–34.0)
MCHC: 32.6 g/dL (ref 30.0–36.0)
MCV: 87.5 fL (ref 80.0–100.0)
Monocytes Absolute: 1 10*3/uL (ref 0.1–1.0)
Monocytes Relative: 12 %
Neutro Abs: 5.4 10*3/uL (ref 1.7–7.7)
Neutrophils Relative %: 65 %
Platelets: 250 10*3/uL (ref 150–400)
RBC: 4.63 MIL/uL (ref 3.87–5.11)
RDW: 14.6 % (ref 11.5–15.5)
WBC: 8.3 10*3/uL (ref 4.0–10.5)
nRBC: 0 % (ref 0.0–0.2)

## 2021-09-14 LAB — COMPREHENSIVE METABOLIC PANEL
ALT: 12 U/L (ref 0–44)
AST: 12 U/L — ABNORMAL LOW (ref 15–41)
Albumin: 4 g/dL (ref 3.5–5.0)
Alkaline Phosphatase: 109 U/L (ref 38–126)
Anion gap: 5 (ref 5–15)
BUN: 16 mg/dL (ref 8–23)
CO2: 28 mmol/L (ref 22–32)
Calcium: 10.8 mg/dL — ABNORMAL HIGH (ref 8.9–10.3)
Chloride: 105 mmol/L (ref 98–111)
Creatinine, Ser: 0.88 mg/dL (ref 0.44–1.00)
GFR, Estimated: >60 mL/min (ref >60)
Glucose, Bld: 95 mg/dL (ref 70–99)
Potassium: 4.1 mmol/L (ref 3.5–5.1)
Sodium: 138 mmol/L (ref 135–145)
Total Bilirubin: 0.4 mg/dL (ref 0.3–1.2)
Total Protein: 7.1 g/dL (ref 6.5–8.1)

## 2021-09-14 MED ORDER — FULVESTRANT 250 MG/5ML IM SOSY
500.0000 mg | PREFILLED_SYRINGE | Freq: Once | INTRAMUSCULAR | Status: AC
  Administered 2021-09-14: 500 mg via INTRAMUSCULAR
  Filled 2021-09-14: qty 10

## 2021-09-14 NOTE — Telephone Encounter (Signed)
DME order for shower chair faxed to [phone] ? ?

## 2021-09-14 NOTE — Patient Instructions (Signed)
Fulvestrant injection What is this medication? FULVESTRANT (ful VES trant) blocks the effects of estrogen. It is used to treat breast cancer. This medicine may be used for other purposes; ask your health care provider or pharmacist if you have questions. COMMON BRAND NAME(S): FASLODEX What should I tell my care team before I take this medication? They need to know if you have any of these conditions: bleeding disorders liver disease low blood counts, like low white cell, platelet, or red cell counts an unusual or allergic reaction to fulvestrant, other medicines, foods, dyes, or preservatives pregnant or trying to get pregnant breast-feeding How should I use this medication? This medicine is for injection into a muscle. It is usually given by a health care professional in a hospital or clinic setting. Talk to your pediatrician regarding the use of this medicine in children. Special care may be needed. Overdosage: If you think you have taken too much of this medicine contact a poison control center or emergency room at once. NOTE: This medicine is only for you. Do not share this medicine with others. What if I miss a dose? It is important not to miss your dose. Call your doctor or health care professional if you are unable to keep an appointment. What may interact with this medication? medicines that treat or prevent blood clots like warfarin, enoxaparin, dalteparin, apixaban, dabigatran, and rivaroxaban This list may not describe all possible interactions. Give your health care provider a list of all the medicines, herbs, non-prescription drugs, or dietary supplements you use. Also tell them if you smoke, drink alcohol, or use illegal drugs. Some items may interact with your medicine. What should I watch for while using this medication? Your condition will be monitored carefully while you are receiving this medicine. You will need important blood work done while you are taking this  medicine. Do not become pregnant while taking this medicine or for at least 1 year after stopping it. Women of child-bearing potential will need to have a negative pregnancy test before starting this medicine. Women should inform their doctor if they wish to become pregnant or think they might be pregnant. There is a potential for serious side effects to an unborn child. Men should inform their doctors if they wish to father a child. This medicine may lower sperm counts. Talk to your health care professional or pharmacist for more information. Do not breast-feed an infant while taking this medicine or for 1 year after the last dose. What side effects may I notice from receiving this medication? Side effects that you should report to your doctor or health care professional as soon as possible: allergic reactions like skin rash, itching or hives, swelling of the face, lips, or tongue feeling faint or lightheaded, falls pain, tingling, numbness, or weakness in the legs signs and symptoms of infection like fever or chills; cough; flu-like symptoms; sore throat vaginal bleeding Side effects that usually do not require medical attention (report to your doctor or health care professional if they continue or are bothersome): aches, pains constipation diarrhea headache hot flashes nausea, vomiting pain at site where injected stomach pain This list may not describe all possible side effects. Call your doctor for medical advice about side effects. You may report side effects to FDA at 1-800-FDA-1088. Where should I keep my medication? This drug is given in a hospital or clinic and will not be stored at home. NOTE: This sheet is a summary. It may not cover all possible information. If you have   questions about this medicine, talk to your doctor, pharmacist, or health care provider.  2023 Elsevier/Gold Standard (2017-08-29 00:00:00)  

## 2021-09-15 ENCOUNTER — Telehealth: Payer: Self-pay

## 2021-09-15 ENCOUNTER — Telehealth: Payer: Self-pay | Admitting: Family Medicine

## 2021-09-15 NOTE — Telephone Encounter (Signed)
? ?  Telephone encounter was:  Unsuccessful.  09/15/2021 ?Name: Alicia Vasquez MRN: [id number] DOB: [birth year] ? ?Unsuccessful outbound call made today to assist with:  Financial Difficulties related to medication assistance, shower chair ? ?Outreach Attempt:  1st Attempt ? ?A HIPAA compliant voice message was left requesting a return call.  Instructed patient to call back at [phone]. ? ?Monice Lundy, [place], CHC ?Care Guide  Embedded Care Coordination ?[place] [place] [facility]  Care Management  ?[address] ?[place], [place] [address] ???[email or web address]  ?? [phone]   ?[email or web address] ?  ?

## 2021-09-15 NOTE — Chronic Care Management (AMB) (Signed)
?  Chronic Care Management  ? ?Outreach Note ? ?09/15/2021 ?Name: Peja Allender Markovic MRN: [id number] DOB: 06-30-1940 ? ?Referred by: [name], MD ?Reason for referral : No chief complaint on file. ? ? ?An unsuccessful telephone outreach was attempted today. The patient was referred to the pharmacist for assistance with care management and care coordination.  ? ?Follow Up Plan: REFERRAL ? ?Tatjana Dellinger ?Upstream Scheduler  ?

## 2021-09-16 ENCOUNTER — Telehealth: Payer: Self-pay

## 2021-09-16 NOTE — Telephone Encounter (Signed)
? ?  Telephone encounter was:  Successful.  ?09/16/2021 ?Name: Alicia Vasquez MRN: [id number] DOB: 1940-12-24 ? ?Alicia Vasquez is a 81 y.o. year old female who is a primary care patient of Jerline Pain, Algis Greenhouse, MD . The community resource team was consulted for assistance with Financial Difficulties related to medication assistance, shower chair. ? ?Care guide performed the following interventions: Spoke with patient to complete the patient portion of the [place] Patient Savings Program Application. Explained that the provider has to complete their portion of the form and she will be contacted when she can come in and sign the form. Emailed PAP application to [employer]. Jerline Pain MD and cc'd Charlott Rakes LPN. Emailed information for shower chair . Letter saved in Epic. ? ?Follow Up Plan:  Care guide will follow up with patient by phone over the next 7 days. ? ?Yvett Rossel, [place], CHC ?Care Guide  Embedded Care Coordination ?[facility]  Care Management  ?[address] ?[place], [place] [address] ???[email or web address]  ?? [phone]   ?[email or web address] ?  ?

## 2021-09-20 ENCOUNTER — Telehealth: Payer: Self-pay | Admitting: Family Medicine

## 2021-09-20 NOTE — Progress Notes (Signed)
?  Chronic Care Management  ? ?Outreach Note ? ?09/20/2021 ?Name: Eldean Klatt Heinzelman MRN: [id number] DOB: 1941-01-29 ? ?Referred by: [name], MD ?Reason for referral : No chief complaint on file. ? ? ?Third unsuccessful telephone outreach was attempted today. The patient was referred to the pharmacist for assistance with care management and care coordination.  ? ?Follow Up Plan:  ? ?Tatjana Dellinger ?Upstream Scheduler  ?

## 2021-09-21 ENCOUNTER — Telehealth: Payer: Self-pay

## 2021-09-21 NOTE — Telephone Encounter (Signed)
? ?  Telephone encounter was:  Successful.  ?09/21/2021 ?Name: Alicia Vasquez MRN: [id number] DOB: 1940/11/30 ? ?Alicia Vasquez is a 81 y.o. year old female who is a primary care patient of Jerline Pain, Algis Greenhouse, MD . The community resource team was consulted for assistance with Financial Difficulties related to medication. ? ?Care guide performed the following interventions: Left message on voicemail to let patient know I am waiting for the provider portion of the Faslode[employer] PAP application to be completed and I will contact her once it is.  Left my name and contact number to call.  ? ?Follow Up Plan:  Care guide will follow up with patient by phone over the next 7 days ? ?Alicia Vasquez, [place], CHC ?Care Guide  Embedded Care Coordination ?[facility]  Care Management  ?[address] ?[place], [place] [address] ???[email or web address]  ?? [phone]   ?[email or web address] ?  ?

## 2021-09-24 ENCOUNTER — Ambulatory Visit: Payer: Medicare Other | Admitting: Podiatry

## 2021-09-27 ENCOUNTER — Other Ambulatory Visit: Payer: Self-pay | Admitting: Hematology and Oncology

## 2021-09-28 ENCOUNTER — Encounter: Payer: Self-pay | Admitting: Oncology

## 2021-09-29 ENCOUNTER — Telehealth: Payer: Self-pay

## 2021-09-29 NOTE — Telephone Encounter (Signed)
? ?  Telephone encounter was:  Unsuccessful.  09/29/2021 ?Name: Alicia Vasquez MRN: [id number] DOB: [birth year] ? ?Unsuccessful outbound call made today to assist with:  Financial Difficulties related to medication ? ?Outreach Attempt:  3rd Attempt.  Referral closed unable to contact patient. ? ?A HIPAA compliant voice message was left requesting a return call.  Instructed patient to call back at [phone]. Left message on voicemail for patient to return my call regarding signing Faslodex PAP application at 6/38 visit. Gave my name and contact number to call.    ? ?Aloysius Heinle, [place], [place] ?Care Guide  Embedded Care Coordination ?[facility]  Care Management  ?[address] ?[place], [place] [address] ???[email or web address]  ?? [phone]   ?[email or web address] ?  ?

## 2021-09-29 NOTE — Telephone Encounter (Signed)
? ?  Telephone encounter was:  Successful.  ?09/29/2021 ?Name: Ani Deoliveira Wisdom MRN: [id number] DOB: [birth year] ? ?Ranelle Auker Brasher is a 81 y.o. year old female who is a primary care patient of Jerline Pain, Algis Greenhouse, MD . The community resource team was consulted for assistance with Financial Difficulties related to medication ? ?Care guide performed the following interventions: Spoke with patient to inform her that her provider has completed their portion of her PAP application for Faslodex.   Received message from Hinda Lenis, RN patient can sign her application at the 8/30 appointment. ? ?Follow Up Plan:   I will call the patient to inform her she can sign the PAP application at her 9/40 visit. ? ?Akilah Cureton, [place], [place] ?Care Guide  Embedded Care Coordination ?[facility]  Care Management  ?[address] ?[place], [place] [address] ???[email or web address]  ?? [phone]   ?[email or web address] ?  ?

## 2021-10-04 ENCOUNTER — Ambulatory Visit: Payer: Medicare Other

## 2021-10-07 ENCOUNTER — Telehealth: Payer: Self-pay

## 2021-10-07 NOTE — Chronic Care Management (AMB) (Signed)
  Care Management   Outreach Note  10/07/2021 Name: Alicia Vasquez [facility] MRN: [id number] DOB: 1940/08/11  Referred by: [name], MD Reason for referral : Care Coordination (Outreach to offer to schedule initial with [facility] )   An unsuccessful telephone outreach was attempted today. The patient was referred to the case management team for assistance with care management and care coordination.   Follow Up Plan:  A HIPAA compliant phone message was left for the patient providing contact information and requesting a return call.  The care management team will reach out to the patient again over the next 7 days.  If patient returns call to provider office, please advise to call [place] * at [phone]*  Noreene Larsson, [place], [place] Management  [place], [place] [address] Direct Dial: [phone] [email or web address] Website: [email or web address]

## 2021-10-11 ENCOUNTER — Inpatient Hospital Stay: Payer: Medicare Other

## 2021-10-11 ENCOUNTER — Telehealth: Payer: Self-pay | Admitting: Hematology and Oncology

## 2021-10-11 ENCOUNTER — Telehealth (INDEPENDENT_AMBULATORY_CARE_PROVIDER_SITE_OTHER): Payer: Medicare Other | Admitting: Family Medicine

## 2021-10-11 ENCOUNTER — Encounter: Payer: Self-pay | Admitting: Family Medicine

## 2021-10-11 VITALS — Ht 64.0 in

## 2021-10-11 DIAGNOSIS — J069 Acute upper respiratory infection, unspecified: Secondary | ICD-10-CM | POA: Diagnosis not present

## 2021-10-11 DIAGNOSIS — R051 Acute cough: Secondary | ICD-10-CM

## 2021-10-11 MED ORDER — BENZONATATE 100 MG PO CAPS: 100.0000 mg | ORAL_CAPSULE | Freq: Two times a day (BID) | ORAL | 0 refills | Status: AC | PRN

## 2021-10-11 MED ORDER — IPRATROPIUM BROMIDE 0.06 % NA SOLN: 2.0000 | Freq: Four times a day (QID) | NASAL | 0 refills | Status: DC

## 2021-10-11 MED ORDER — DOXYCYCLINE HYCLATE 100 MG PO TABS: 100.0000 mg | ORAL_TABLET | Freq: Two times a day (BID) | ORAL | 0 refills | Status: AC

## 2021-10-11 NOTE — Telephone Encounter (Signed)
Scheduled appointment per inbasket message. Left message.   ?

## 2021-10-11 NOTE — Progress Notes (Signed)
Virtual Visit via Telephone Note ?I connected with Alicia Vasquez on 10/11/21 at 12:00 PM EDT by telephone and verified that I am speaking with the correct person using two identifiers. ?  ?I discussed the limitations, risks, security and privacy concerns of performing an evaluation and management service by telephone and the availability of in person appointments. I also discussed with the patient that there may be a patient responsible charge related to this service. The patient expressed understanding and agreed to proceed. ? ?Location patient: home ?Location provider: work office ?Participants present for the call: patient, friend, and provider ?Patient did not have a visit in the prior 7 days to address this/these issue(s). ? ?Chief Complaint  ?Patient presents with  ? Cough  ? Nasal Congestion  ? ?History of Present Illness: ?Alicia Vasquez is a 81 y.o.female with hx of hypertension, hyperparathyroidism, right breast cancer with metastasis, GERD, hearing loss, and chronic anticoagulation complaining of 4 days of respiratory symptoms. ?Her friend helps with interrogation due to hearing loss. ?Sh has not checked temp. ?Has not had a COVID 19 test done. ?Negative for anosmia and ageusia. ? ?Cough ?This is a new problem. The current episode started in the past 7 days. The problem has been unchanged. The problem occurs every few minutes. The cough is Productive of sputum. Associated symptoms include chills, myalgias, nasal congestion, postnasal drip and rhinorrhea. Pertinent negatives include no chest pain, ear congestion, ear pain, fever, headaches, heartburn, hemoptysis, rash, sore throat, shortness of breath or wheezing. Nothing aggravates the symptoms. She has tried nothing for the symptoms. There is no history of environmental allergies.  ?Negative for body aches, abdominal pain, nausea, vomiting, changes in bowel habits, urinary symptoms, or skin rash. ?No sick contact. ?She has not taken OTC  medications to treat her symptoms. ?States that in the past she has taken Z pak to treat similar symptoms. ? ?Observations/Objective: ?Patient sounds cheerful and well on the phone. ?I do not appreciate any SOB,cough,or wheezing. ?Speech and thought processing are grossly intact. ?Patient reported vitals:Ht '5\' 4"'$  (1.626 m)   BMI 36.56 kg/m?  ? ?Assessment and Plan: ? ?1. URI, acute ?Symptoms suggests a viral etiology, in which case antibiotic would not help. ?She needs to have a COVID 19 test done today,if possible. ?Adequate hydration. ?Tylenol 500 mg 3-4 times per day as needed. ?Nasal saline irrigations as needed and Atrovent nasal spray 3-4 times per day as needed. ?Instructed to monitor for signs of complications, including new onset of fever among some, clearly instructed about warning signs. ?I do not think antibiotic is needed at this time, sent a prescription for doxycycline to start taking if she is not any better in 48 hours or if symptoms get worse. ?If she decides to start antibiotic, she needs to call Coumadin clinic to have INR check 1 to 2 days after. ?We discussed some side effects. ? ?- doxycycline (VIBRA-TABS) 100 MG tablet; Take 1 tablet (100 mg total) by mouth 2 (two) times daily for 7 days.  Dispense: 14 tablet; Refill: 0 ?- ipratropium (ATROVENT) 0.06 % nasal spray; Place 2 sprays into both nostrils 4 (four) times daily.  Dispense: 15 mL; Refill: 0 ? ?2. Acute cough ?Explained that cough and congestion can last a few days and sometimes weeks after acute symptoms have resolved. ?Symptomatic treatment with benzonatate and adequate hydration. ?I do not think chest imaging is needed at this time. ?She was clearly instructed about warning signs. ? ?- benzonatate (TESSALON) 100 MG capsule;  Take 1 capsule (100 mg total) by mouth 2 (two) times daily as needed for up to 10 days.  Dispense: 20 capsule; Refill: 0 ? ?Follow Up Instructions: ? ?Return if symptoms worsen or fail to improve. ? ?I did not  refer this patient for an OV in the next 24 hours for this/these issue(s). ? ?I discussed the assessment and treatment plan with the patient. The patient was provided an opportunity to ask questions and all were answered. The patient agreed with the plan and demonstrated an understanding of the instructions. ?  ?The patient was advised to call back or seek an in-person evaluation if the symptoms worsen or if the condition fails to improve as anticipated. ? ?I provided  15 minutes of non-face-to-face time during this encounter. ? ?Alicia Langenfeld G. [place], MD ? ?[place]. ?[place] office. ? ?

## 2021-10-12 ENCOUNTER — Ambulatory Visit (INDEPENDENT_AMBULATORY_CARE_PROVIDER_SITE_OTHER): Payer: Medicare Other | Admitting: Podiatry

## 2021-10-12 ENCOUNTER — Encounter: Payer: Self-pay | Admitting: Podiatry

## 2021-10-12 DIAGNOSIS — M79674 Pain in right toe(s): Secondary | ICD-10-CM | POA: Diagnosis not present

## 2021-10-12 DIAGNOSIS — M79675 Pain in left toe(s): Secondary | ICD-10-CM

## 2021-10-12 DIAGNOSIS — D689 Coagulation defect, unspecified: Secondary | ICD-10-CM

## 2021-10-12 DIAGNOSIS — B351 Tinea unguium: Secondary | ICD-10-CM | POA: Diagnosis not present

## 2021-10-12 NOTE — Progress Notes (Signed)
  Subjective:  Patient ID: Alicia Vasquez [facility], female    DOB: 09/05/1940,  MRN: [id number]  Goodrich presents to clinic today for painful elongated mycotic toenails 1-5 bilaterally which are tender when wearing enclosed shoe gear. Pain is relieved with periodic professional debridement.  Patient has metastatic breast cancer and is being followed by Oncology.  New problem(s): None.   PCP is [name], MD , and last visit was June 24, 2021.  Allergies  Allergen Reactions   Anesthetics, Amide Hypertension   Benadryl [Diphenhydramine Hcl] Other (See Comments)    Dizziness   Carbocaine [Mepivacaine Hcl] Hypertension   Codeine Other (See Comments)    Dizziness   Epinephrine Hypertension   Sulfa Antibiotics Other (See Comments)    dizziness   Diphenhydramine    Latex Other (See Comments)    Blisters in mouth   Penicillins Rash    Has patient had a PCN reaction causing immediate rash, facial/tongue/throat swelling, SOB or lightheadedness with hypotension: Unknown Has patient had a PCN reaction causing severe rash involving mucus membranes or skin necrosis: Unknown Has patient had a PCN reaction that required hospitalization: Unknown Has patient had a PCN reaction occurring within the last 10 years: Unknown If all of the above answers are "NO", then may proceed with Cephalosporin use.    Tramadol Other (See Comments)    Sedation.     Review of Systems: Negative except as noted in the HPI.  Objective: No changes noted in today's physical examination. Ms. Andon is a pleasant 81 y.o. female, chronically ill, in NAD. AAO x 3.  Vascular Examination: Faintly palpable pedal pulses. CFT < 3 seconds b/l. No edema. No pain with calf compression b/l. Skin temperature gradient WNL b/l.   Neurological Examination: Sensation grossly intact b/l with 10 gram monofilament. Vibratory sensation intact b/l.   Dermatological Examination: Pedal skin with normal turgor, texture  and tone b/l. Toenails 1-5 b/l thick, discolored, elongated with subungual debris and pain on dorsal palpation. No hyperkeratotic lesions noted b/l.   Musculoskeletal Examination: Muscle strength 5/5 to b/l LE. No pain, crepitus or joint limitation noted with ROM bilateral LE. No gross bony deformities bilaterally. Patient utilizes wheelchair for mobility assistance on today's visit.  Radiographs: None  Assessment/Plan: 1. Pain due to onychomycosis of toenails of both feet   2. 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 All patient's and/or POA's questions/concerns answered on today's visit. -Examined patient. -Mycotic toenails 1-5 bilaterally were debrided in length and girth with sterile nail nippers and dremel without incident. -Patient/POA to call should there be question/concern in the interim.   Return in about 3 months (around 01/12/2022).  Marzetta Board, DPM

## 2021-10-13 ENCOUNTER — Inpatient Hospital Stay: Payer: Medicare Other

## 2021-10-13 ENCOUNTER — Inpatient Hospital Stay: Payer: Medicare Other | Attending: Oncology

## 2021-10-13 ENCOUNTER — Other Ambulatory Visit: Payer: Self-pay

## 2021-10-13 VITALS — BP 162/72 | HR 76 | Temp 97.7°F | Resp 18

## 2021-10-13 DIAGNOSIS — Z7901 Long term (current) use of anticoagulants: Secondary | ICD-10-CM | POA: Insufficient documentation

## 2021-10-13 DIAGNOSIS — C50411 Malignant neoplasm of upper-outer quadrant of right female breast: Secondary | ICD-10-CM | POA: Diagnosis not present

## 2021-10-13 DIAGNOSIS — Z170 Estrogen receptor positive status [ER+]: Secondary | ICD-10-CM | POA: Insufficient documentation

## 2021-10-13 DIAGNOSIS — Z5111 Encounter for antineoplastic chemotherapy: Secondary | ICD-10-CM | POA: Insufficient documentation

## 2021-10-13 DIAGNOSIS — M899 Disorder of bone, unspecified: Secondary | ICD-10-CM

## 2021-10-13 DIAGNOSIS — T1490XA Injury, unspecified, initial encounter: Secondary | ICD-10-CM

## 2021-10-13 DIAGNOSIS — M84659P Pathological fracture in other disease, hip, unspecified, subsequent encounter for fracture with malunion: Secondary | ICD-10-CM

## 2021-10-13 LAB — CBC WITH DIFFERENTIAL/PLATELET
Abs Immature Granulocytes: 0.04 10*3/uL (ref 0.00–0.07)
Basophils Absolute: 0.1 10*3/uL (ref 0.0–0.1)
Basophils Relative: 1 %
Eosinophils Absolute: 0.3 10*3/uL (ref 0.0–0.5)
Eosinophils Relative: 4 %
HCT: 37.8 % (ref 36.0–46.0)
Hemoglobin: 12.3 g/dL (ref 12.0–15.0)
Immature Granulocytes: 0 %
Lymphocytes Relative: 19 %
Lymphs Abs: 1.7 10*3/uL (ref 0.7–4.0)
MCH: 28.4 pg (ref 26.0–34.0)
MCHC: 32.5 g/dL (ref 30.0–36.0)
MCV: 87.3 fL (ref 80.0–100.0)
Monocytes Absolute: 1.3 10*3/uL — ABNORMAL HIGH (ref 0.1–1.0)
Monocytes Relative: 14 %
Neutro Abs: 5.7 10*3/uL (ref 1.7–7.7)
Neutrophils Relative %: 62 %
Platelets: 306 10*3/uL (ref 150–400)
RBC: 4.33 MIL/uL (ref 3.87–5.11)
RDW: 14.6 % (ref 11.5–15.5)
WBC: 9.1 10*3/uL (ref 4.0–10.5)
nRBC: 0 % (ref 0.0–0.2)

## 2021-10-13 LAB — COMPREHENSIVE METABOLIC PANEL
ALT: 9 U/L (ref 0–44)
AST: 11 U/L — ABNORMAL LOW (ref 15–41)
Albumin: 3.8 g/dL (ref 3.5–5.0)
Alkaline Phosphatase: 118 U/L (ref 38–126)
Anion gap: 4 — ABNORMAL LOW (ref 5–15)
BUN: 15 mg/dL (ref 8–23)
CO2: 30 mmol/L (ref 22–32)
Calcium: 10.5 mg/dL — ABNORMAL HIGH (ref 8.9–10.3)
Chloride: 106 mmol/L (ref 98–111)
Creatinine, Ser: 0.85 mg/dL (ref 0.44–1.00)
GFR, Estimated: >60 mL/min (ref >60)
Glucose, Bld: 102 mg/dL — ABNORMAL HIGH (ref 70–99)
Potassium: 4.3 mmol/L (ref 3.5–5.1)
Sodium: 140 mmol/L (ref 135–145)
Total Bilirubin: 0.4 mg/dL (ref 0.3–1.2)
Total Protein: 6.7 g/dL (ref 6.5–8.1)

## 2021-10-13 LAB — PROTIME-INR
INR: 1.7 — ABNORMAL HIGH (ref 0.8–1.2)
Prothrombin Time: 19.7 seconds — ABNORMAL HIGH (ref 11.4–15.2)

## 2021-10-13 MED ORDER — FULVESTRANT 250 MG/5ML IM SOSY
500.0000 mg | PREFILLED_SYRINGE | Freq: Once | INTRAMUSCULAR | Status: AC
  Administered 2021-10-13: 500 mg via INTRAMUSCULAR
  Filled 2021-10-13: qty 10

## 2021-10-14 ENCOUNTER — Telehealth: Payer: Self-pay

## 2021-10-14 NOTE — Telephone Encounter (Signed)
-----   Message from Gardenia Phlegm, NP sent at 10/14/2021  2:52 PM EDT ----- Please call patient and see how she is taking the Coumadin.  She often gets confused with this. ----- Message ----- From: Buel Ream, Lab In [place] Sent: 10/13/2021  12:42 PM EDT To: Gardenia Phlegm, NP

## 2021-10-14 NOTE — Telephone Encounter (Signed)
Attempted to call pt, no answer LVM for call back. Will make nurse aware to f/u 10/15/21

## 2021-10-15 ENCOUNTER — Telehealth: Payer: Self-pay

## 2021-10-15 ENCOUNTER — Telehealth: Payer: Self-pay | Admitting: *Deleted

## 2021-10-15 NOTE — Telephone Encounter (Addendum)
-----   Message from Gardenia Phlegm, NP sent at 10/14/2021  2:52 PM EDT ----- Please call patient and see how she is taking the Coumadin.  She often gets confused with this. ----- Message ----- From: Interface, Lab In [place] Sent: 10/13/2021  12:42 PM EDT To: Gardenia Phlegm, NP  This RN was able to speak with pt - who states she did forgot a couple of doses of her coumadin.  Per conversation she also states she was seen by her primary MD- who is sending in an antibioticStanton Kidney asked if she should take it. This RN informed her to follow her primary MD instructions.

## 2021-10-15 NOTE — Telephone Encounter (Signed)
Attempt to call pt regarding INR results.  No VM available

## 2021-10-18 ENCOUNTER — Encounter: Payer: Self-pay | Admitting: Internal Medicine

## 2021-10-18 ENCOUNTER — Encounter: Payer: Self-pay | Admitting: Oncology

## 2021-10-18 ENCOUNTER — Ambulatory Visit (INDEPENDENT_AMBULATORY_CARE_PROVIDER_SITE_OTHER): Payer: Medicare Other | Admitting: Internal Medicine

## 2021-10-18 VITALS — BP 138/78 | HR 73 | Ht 64.0 in

## 2021-10-18 DIAGNOSIS — E213 Hyperparathyroidism, unspecified: Secondary | ICD-10-CM | POA: Diagnosis not present

## 2021-10-18 DIAGNOSIS — E039 Hypothyroidism, unspecified: Secondary | ICD-10-CM

## 2021-10-18 DIAGNOSIS — E559 Vitamin D deficiency, unspecified: Secondary | ICD-10-CM

## 2021-10-18 LAB — VITAMIN D 25 HYDROXY (VIT D DEFICIENCY, FRACTURES): VITD: 45.67 ng/mL (ref 30.00–100.00)

## 2021-10-18 NOTE — Progress Notes (Signed)
Patient ID: Alicia Vasquez, female   DOB: 1941-03-27, 81 y.o.   MRN: [id number]  This visit occurred during the SARS-CoV-2 public health emergency.  Safety protocols were in place, including screening questions prior to the visit, additional usage of staff PPE, and extensive cleaning of exam room while observing appropriate contact time as indicated for disinfecting solutions.   HPI  Alicia Vasquez is a 81 y.o.-year-old female, returning for follow-up for hyperparathyroidism and severe vitamin D deficiency, hypothyroidism. Last visit 1 year ago. She is here with a friend who helps [name] converse since patient is very hypoacusic.  Interim history: She continues on Xgeva, restarted before last visit.  She has a metastatic left femoral lesion. No fractures since last visit. She had kidney stones last year >> had surgery >> went to [place] after this. She fell there >> L prosthesis cracked. She had a UTI.  Reviewed history: Pt was dx with found to have an elevated parathyroid hormone in 02/2014.   She also had high calcium levels >> high at 11.5.  Per her preference, we are following her clinically and biochemically.  I reviewed pertinent labs: Lab Results  Component Value Date   PTH 46 05/05/2017   PTH 26 12/06/2016   PTH Comment 12/06/2016   PTH 31 12/07/2015   PTH Comment 12/07/2015   PTH 37 06/09/2015   PTH Comment 06/09/2015   PTH 37 12/19/2014   PTH Comment 12/19/2014   PTH 88 (H) 07/27/2014   CALCIUM 10.5 (H) 10/13/2021   CALCIUM 10.8 (H) 09/14/2021   CALCIUM 10.9 (H) 08/25/2021   CALCIUM 11.2 (H) 08/18/2021   CALCIUM 10.8 (H) 07/19/2021   CALCIUM 10.8 (H) 06/14/2021   CALCIUM 10.7 (H) 05/29/2021   CALCIUM 10.8 (H) 05/17/2021   CALCIUM 10.9 (H) 05/03/2021   CALCIUM 9.9 03/18/2021   Vitamin D deficiency-PTH level normalized after normalization of her vitamin D levels, however, calcium levels remain elevated.  Pt was taking 1000 IU vitamin D in the past - a  level was very low then >> we increased her vitamin D supplement to 5000 units daily >> vitamin D normalized.  She takes this dose now.  Latest vitamin D level was normal: Lab Results  Component Value Date   VD25OH 47.9 10/19/2020   VD25OH 49 06/21/2019   VD25OH 59.91 06/14/2018   VD25OH 63 05/05/2017   VD25OH 45.98 12/06/2016   VD25OH 47.68 12/07/2015   VD25OH 45.83 06/09/2015   VD25OH 30.58 12/19/2014   VD25OH 26.82 (L) 10/17/2014   VD25OH 7.19 (L) 04/18/2014   Magnesium, phosphorus, calcitriol levels were normal: Component     Latest Ref Rng 04/18/2014         3:04 PM  Vitamin D 1, 25 (OH) Total     18 - 72 pg/mL 55  Vitamin D3 1, 25 (OH)      55  Vitamin D2 1, 25 (OH)      <8  Magnesium     1.5 - 2.5 mg/dL 2.1  Phosphorus     2.3 - 4.6 mg/dL 2.8   She did not collect a 24h urine.  Reviewed previous DXA scan reports: - 09/03/2014: no osteoporosis/but osteopenia at LFN: -1.6 - 08/2016: Osteopenia RFN: -2.2, LFN: -2.1; R radius 33% distal: -0.2  She had the following fractures: - 1994: R foot  - 1990's: L hairline fracture  - 12/2016: Periprosthetic pathological hip fracture  On Xgeva for breast cancer metastatic to the bones.  + history of kidney stones  x1.  No CKD. Last BUN/Cr: Lab Results  Component Value Date   BUN 15 10/13/2021   CREATININE 0.85 10/13/2021   She also has a history of breast cancer and is on Arimidex. She had RxTx. No ChTx.  Hypothyroidism:  Pt is on levothyroxine 125 mcg daily, taken: - in am - fasting - at least 30 min from b'fast - no Ca, Fe, MVI, PPIs - not on Biotin  Last TSH normal: Lab Results  Component Value Date   TSH 2.302 08/18/2021   Pt denies: - feeling nodules in neck - dysphagia - SOB with lying down But has hoarseness, and occasional chocking which is chronic. In the recliner, due to the risk of falling out of bed at night.  ROS: + See HPI Constitutional: + fatigue Musculoskeletal: + muscle aches/+  joint aches (back pain)  I reviewed pt's medications, allergies, PMH, social hx, family hx, and changes were documented in the history of present illness. Otherwise, unchanged from my initial visit note.   Past Medical History:  Diagnosis Date   Allergy    Anxiety    Arthritis    Blood transfusion without reported diagnosis    Breast cancer ([place]) 07/12/2013   Invasive Mammary Carcinoma   DVT (deep vein thrombosis) in pregnancy    Hypertension    Hypothyroid    Metastatic cancer to bone ([place]) dx'd 12/2016   hip   Personal history of radiation therapy    Pneumonia    PONV (postoperative nausea and vomiting)    Radiation 11/21/13-01/07/14   Right Breast/Supraclavicular   Past Surgical History:  Procedure Laterality Date   BREAST LUMPECTOMY Left 2015   BREAST LUMPECTOMY WITH RADIOACTIVE SEED LOCALIZATION Right 09/09/2013   Procedure: BREAST LUMPECTOMY WITH RADIOACTIVE SEED LOCALIZATION WITH AXILLARY NODE EXCISION;  Surgeon: [name], MD;  Location: [facility];  Service: General;  Laterality: Right;   CYSTOSCOPY W/ URETERAL STENT PLACEMENT Right 01/07/2021   Procedure: CYSTOSCOPY WITH RETROGRADE PYELOGRAM/URETERAL STENT PLACEMENT;  Surgeon: Bjorn Loser, MD;  Location: [facility];  Service: Urology;  Laterality: Right;   CYSTOSCOPY/URETEROSCOPY/HOLMIUM LASER/STENT PLACEMENT Right 02/08/2021   Procedure: CYSTOSCOPY, RIGHT URETEROSCOPY, RIGHT RETRGRADE PYELOGRAM, HOLMIUM LASER/STENT PLACEMENT;  Surgeon: Lucas Mallow, MD;  Location: [facility];  Service: Urology;  Laterality: Right;   DENTAL SURGERY  04/19/2012   13 TEETH REMOVED   DILATION AND CURETTAGE OF UTERUS     IR RADIOLOGIST EVAL & MGMT  01/21/2021   ORIF PERIPROSTHETIC FRACTURE Left 01/31/2017   Procedure: REVISION and OPEN REDUCTION INTERNAL FIXATION (ORIF) PERIPROSTHETIC FRACTURE LEFT HIP;  Surgeon: Paralee Cancel, MD;  Location: [facility];  Service: Orthopedics;  Laterality: Left;  120 mins   RE-EXCISION OF  BREAST LUMPECTOMY Right 09/24/2013   Procedure: RE-EXCISION OF RIGHT BREAST LUMPECTOMY;  Surgeon: [name], MD;  Location: [facility];  Service: General;  Laterality: Right;   TOTAL HIP ARTHROPLASTY Left 01/16/2017   Procedure: TOTAL HIP ARTHROPLASTY POSTERIOR;  Surgeon: Paralee Cancel, MD;  Location: [facility];  Service: Orthopedics;  Laterality: Left;   History   Social History   Marital Status: Widowed    Spouse Name: N/A    Number of Children: 1   Occupational History   Retired Radio broadcast assistant   Social History Main Topics   Smoking status: Never Smoker    Smokeless tobacco: Not on file   Alcohol Use: No   Drug Use: No   Social History Narrative   Exercise yard work   Current  Outpatient Medications on File Prior to Visit  Medication Sig Dispense Refill   benzonatate (TESSALON) 100 MG capsule Take 1 capsule (100 mg total) by mouth 2 (two) times daily as needed for up to 10 days. 20 capsule 0   Cholecalciferol (VITAMIN D3) 5000 UNITS TABS Take 5,000 Units by mouth daily.      doxycycline (VIBRA-TABS) 100 MG tablet Take 1 tablet (100 mg total) by mouth 2 (two) times daily for 7 days. 14 tablet 0   HYDROcodone-acetaminophen (NORCO/VICODIN) 5-325 MG tablet Take 1 tablet by mouth every 6 (six) hours as needed for moderate pain. 120 tablet 0   ipratropium (ATROVENT) 0.06 % nasal spray Place 2 sprays into both nostrils 4 (four) times daily. 15 mL 0   SYNTHROID 125 MCG tablet TAKE 1 TABLET BY MOUTH EVERY DAY BEFORE BREAKFAST 90 tablet 4   warfarin (COUMADIN) 2.5 MG tablet Pt to use with 5 mg tab on Tues/Thur/Sat and Sun for dose of 7.5 mg 30 tablet 3   warfarin (COUMADIN) 5 MG tablet TAKE 1 TABLET (5 MG TOTAL) BY MOUTH DAILY. TAKE 5 MG BY MOUTH TUESDAY, THURSDAY, SATURDAY. 90 tablet 1   No current facility-administered medications on file prior to visit.   Allergies  Allergen Reactions   Anesthetics, Amide Hypertension   Benadryl [Diphenhydramine Hcl] Other (See  Comments)    Dizziness   Carbocaine [Mepivacaine Hcl] Hypertension   Codeine Other (See Comments)    Dizziness   Epinephrine Hypertension   Sulfa Antibiotics Other (See Comments)    dizziness   Diphenhydramine    Latex Other (See Comments)    Blisters in mouth   Penicillins Rash    Has patient had a PCN reaction causing immediate rash, facial/tongue/throat swelling, SOB or lightheadedness with hypotension: Unknown Has patient had a PCN reaction causing severe rash involving mucus membranes or skin necrosis: Unknown Has patient had a PCN reaction that required hospitalization: Unknown Has patient had a PCN reaction occurring within the last 10 years: Unknown If all of the above answers are "NO", then may proceed with Cephalosporin use.    Tramadol Other (See Comments)    Sedation.    Family History  Problem Relation Age of Onset   Heart disease Brother    Colon cancer Brother    Prostate cancer Brother    PE: BP 138/78 (BP Location: Left Arm, Patient Position: Sitting, Cuff Size: Normal)   Pulse 73   Ht '5\' 4"'$  (1.626 m)   SpO2 95%   BMI 36.56 kg/m    Wt Readings from Last 3 Encounters:  09/13/21 213 lb (96.6 kg)  08/18/21 216 lb (98 kg)  07/19/21 213 lb (96.6 kg)   Constitutional: overweight, in NAD Eyes: PERRLA, EOMI, no exophthalmos ENT: moist mucous membranes, no thyromegaly, no cervical lymphadenopathy Cardiovascular: RRR, No MRG Respiratory: CTA B Musculoskeletal: no deformities, strength intact in all 4 Skin: moist, warm, no rashes Neurological: no tremor with outstretched hands, DTR normal in all 4  Assessment: 1. Normocalcemic hyperparathyroidism  2. H/o Vitamin D deficiency  3. Hypothyroidism  Plan: 1. Hyperparathyroidism  -Patient with an interesting history of mostly normal calcium levels in the past with elevated PTH and low vitamin D.  Her PTH levels normalized after normalizing her vitamin D level, however, calcium started to increase.  Her  calcitriol, magnesium, phosphorus, were all normal.  She continues on vitamin D supplementation.  Latest calcium level was slightly high on 10/13/2021, at 10.5, improving as follows: Lab Results  Component  Value Date   CALCIUM 10.5 (H) 10/13/2021   CALCIUM 10.8 (H) 09/14/2021   CALCIUM 10.9 (H) 08/25/2021   CALCIUM 11.2 (H) 08/18/2021  -Since last visit, she had an episode of nephrolithiasis. No osteoporosis on the bone density scan (33% radius BMD was normal and higher than the rest of the scores on her DXA scan from 08/2017), but she does have a history of pelvic fracture, which qualifies her for clinical osteoporosis -She was previously on Xgeva for breast cancer with bone metastasis, which should also help with lowering calcium levels, but came off in ~2020 and then she restarted it. -Per her preference, we are following her clinically and biochemically, but without intervention -See her back in a year  2. H/o Severe vitamin D deficiency -At last visit, vitamin D level was normal: Lab Results  Component Value Date   VD25OH 47.9 10/19/2020  -She continues on vitamin D3 supplement 5000 units daily -We we will recheck the level again now  3. Hypothyroidism - latest thyroid labs reviewed with pt. >> normal: Lab Results  Component Value Date   TSH 2.302 08/18/2021  - she continues on LT4 125 mcg daily - pt feels good on this dose. - we discussed about taking the thyroid hormone every day, with water, >30 minutes before breakfast, separated by >4 hours from acid reflux medications, calcium, iron, multivitamins. Pt. is taking it correctly.  Component     Latest Ref Rng 10/18/2021  VITD     30.00 - 100.00 ng/mL 45.67   Vitamin D is at goal.  Philemon Kingdom, MD PhD [facility] Endocrinology

## 2021-10-18 NOTE — Patient Instructions (Addendum)
Please continue vitamin D 5000 units daily.  Please continue Levothyroxine 125 mcg daily.  Take the thyroid hormone every day, with water, at least 30 minutes before breakfast, separated by at least 4 hours from: - acid reflux medications - calcium - iron - multivitamins  Try to use [place] or Eucerin on feet.  Please stop at the lab.  Please come back for a follow-up appointment in 1 year.

## 2021-10-19 ENCOUNTER — Other Ambulatory Visit: Payer: Self-pay | Admitting: Hematology and Oncology

## 2021-10-19 DIAGNOSIS — Z170 Estrogen receptor positive status [ER+]: Secondary | ICD-10-CM

## 2021-10-19 DIAGNOSIS — C7951 Secondary malignant neoplasm of bone: Secondary | ICD-10-CM

## 2021-10-19 MED ORDER — HYDROCODONE-ACETAMINOPHEN 5-325 MG PO TABS: 1.0000 | ORAL_TABLET | Freq: Four times a day (QID) | ORAL | 0 refills | Status: DC | PRN

## 2021-10-19 NOTE — Progress Notes (Signed)
Hydrocodone prescription refilled.  Alicia Vasquez

## 2021-10-21 NOTE — Chronic Care Management (AMB) (Signed)
  Care Management   Outreach Note  10/21/2021 Name: Alicia Vasquez [facility] MRN: [id number] DOB: 1941/02/07  Referred by: [name], MD Reason for referral : Care Coordination (Outreach to offer to schedule initial with [facility] )   A second unsuccessful telephone outreach was attempted today. The patient was referred to the case management team for assistance with care management and care coordination.   Follow Up Plan:  The care management team will reach out to the patient again over the next 7 days.  If patient returns call to provider office, please advise to call [place] * at [phone]*  Noreene Larsson, [place] [place] [facility]  [place], [place] [address] Direct Dial: [phone] [email or web address] Website: [email or web address]

## 2021-10-26 ENCOUNTER — Telehealth: Payer: Self-pay | Admitting: *Deleted

## 2021-10-26 ENCOUNTER — Ambulatory Visit (INDEPENDENT_AMBULATORY_CARE_PROVIDER_SITE_OTHER): Payer: Medicare Other | Admitting: Family Medicine

## 2021-10-26 ENCOUNTER — Encounter (INDEPENDENT_AMBULATORY_CARE_PROVIDER_SITE_OTHER): Payer: Self-pay

## 2021-10-26 ENCOUNTER — Encounter: Payer: Self-pay | Admitting: Family Medicine

## 2021-10-26 VITALS — BP 138/74 | HR 75 | Temp 98.3°F | Ht 64.0 in | Wt 219.0 lb

## 2021-10-26 DIAGNOSIS — R350 Frequency of micturition: Secondary | ICD-10-CM | POA: Diagnosis not present

## 2021-10-26 DIAGNOSIS — K219 Gastro-esophageal reflux disease without esophagitis: Secondary | ICD-10-CM

## 2021-10-26 DIAGNOSIS — R531 Weakness: Secondary | ICD-10-CM | POA: Diagnosis not present

## 2021-10-26 LAB — POCT URINALYSIS DIPSTICK
Bilirubin, UA: NEGATIVE
Blood, UA: NEGATIVE
Glucose, UA: NEGATIVE
Ketones, UA: NEGATIVE
Nitrite, UA: POSITIVE
Protein, UA: NEGATIVE
Spec Grav, UA: 1.015 (ref 1.010–1.025)
Urobilinogen, UA: 0.2 E.U./dL
pH, UA: 6 (ref 5.0–8.0)

## 2021-10-26 MED ORDER — CEPHALEXIN 500 MG PO CAPS: 500.0000 mg | ORAL_CAPSULE | Freq: Four times a day (QID) | ORAL | 0 refills | Status: DC

## 2021-10-26 NOTE — Progress Notes (Signed)
   Alicia Vasquez is a 81 y.o. female who presents today for an office visit.  Assessment/Plan:  New/Acute Problems: UTI History and UA consistent with UTI.  We will start Keflex.  She has done well with this in the past.  Urine culture is pending.  Encouraged hydration.  She will let us know if not improving.  No signs of systemic illness.  Cellulitis  We will be starting Keflex as above which should treat any underlying cellulitis.  She will let me know if not improving.  May consider empiric trial of Diflucan given location in intertriginous area if not improving with above.  Cough Resolved.  Continue with watchful waiting.   Chronic Problems Addressed Today: GERD (gastroesophageal reflux disease) Still has slight dysphagia.  Had a lengthy discussion with patient regarding management status.  She is reluctantly agreeable to seeing GI for further evaluation.  We have tried Protonix in the past without much improvement in her dysphagia symptoms.  Weakness Symptoms are overall stable though she needs DME order for shower chair.  This was provided today.     Subjective:  HPI:  See A/p for status of chronic conditions.    She has several issues that she would like to discuss today:  She is concerned for a UTI. Started a couple of weeks ago. Feels like prior UTIs. No fevers or chills. Symptoms include dysuria. She also has a few area of inflammation in her groin as well. No nausea or vomiting.   She has also had a cough for the last few weeks. She had a virtual visit with a different provider and was given a prescirption for doxycycline however she did not start this.         Objective:  Physical Exam: BP 138/74   Pulse 75   Temp 98.3 F (36.8 C) (Temporal)   Ht '5\' 4"'$  (1.626 m)   Wt 219 lb (99.3 kg)   SpO2 96%   BMI 37.59 kg/m   Gen: No acute distress, resting comfortably Skin: A few indurated and erythematous lesions on left retrosternal area. Neuro: Grossly  normal, moves all extremities Psych: Normal affect and thought content  Time Spent: 40 minutes of total time was spent on the date of the encounter performing the following actions: chart review prior to seeing the patient including recent ED visit, obtaining history, performing a medically necessary exam, counseling on the treatment plan, placing orders, and documenting in our EHR.        Algis Greenhouse. Jerline Pain, MD 10/26/2021 3:03 PM

## 2021-10-26 NOTE — Assessment & Plan Note (Signed)
Symptoms are overall stable though she needs DME order for shower chair.  This was provided today.

## 2021-10-26 NOTE — Assessment & Plan Note (Signed)
Still has slight dysphagia.  Had a lengthy discussion with patient regarding management status.  She is reluctantly agreeable to seeing GI for further evaluation.  We have tried Protonix in the past without much improvement in her dysphagia symptoms.

## 2021-10-26 NOTE — Patient Instructions (Addendum)
It was very nice to see you today!  Please start the keflex.   I will refer you to see the gastroenterologist.  Please come back in 3-6 months or sooner if needed.   Take care, Dr Jerline Pain  PLEASE NOTE:  If you had any lab tests please let us know if you have not heard back within a few days. You may see your results on mychart before we have a chance to review them but we will give you a call once they are reviewed by [name]. If we ordered any referrals today, please let us know if you have not heard from their office within the next week.   Please try these tips to maintain a healthy lifestyle:  Eat at least 3 REAL meals and 1-2 snacks per day.  Aim for no more than 5 hours between eating.  If you eat breakfast, please do so within one hour of getting up.   Each meal should contain half fruits/vegetables, one quarter protein, and one quarter carbs (no bigger than a computer mouse)  Cut down on sweet beverages. This includes juice, soda, and sweet tea.   Drink at least 1 glass of water with each meal and aim for at least 8 glasses per day  Exercise at least 150 minutes every week.

## 2021-10-26 NOTE — Telephone Encounter (Signed)
DME faxed to [phone]

## 2021-10-27 ENCOUNTER — Telehealth: Payer: Self-pay | Admitting: Family Medicine

## 2021-10-27 ENCOUNTER — Other Ambulatory Visit: Payer: Self-pay | Admitting: *Deleted

## 2021-10-27 NOTE — Telephone Encounter (Signed)
Pt calling back needing an update about the medication problem mentioned below.  Penicillin allergy.  Please follow asap

## 2021-10-27 NOTE — Telephone Encounter (Signed)
Please advise   DME was faxed on 10/26/2021

## 2021-10-27 NOTE — Telephone Encounter (Signed)
Pt states she was prescribed Keflex recently but she is allergic to amoxicillin. She is asking for something to take. She is also asking for an update on a bath chair that was to be ordered. Please advise.

## 2021-10-27 NOTE — Telephone Encounter (Signed)
Spoke with patient, patient notified  She has done well with this prescription in the past, stated will not take it now.  Patient aware DME was Faxed yesterday after her visit

## 2021-10-28 NOTE — Telephone Encounter (Signed)
It is safe for her to take Keflex with penicillin allergy and she did well with this just a few months ago however if she wishes to switch we will need to wait on her culture results come back to make sure that we do not need to switch again.  They should be back within the next day or so.

## 2021-10-28 NOTE — Telephone Encounter (Signed)
Urine culture shows E. Coli

## 2021-10-28 NOTE — Telephone Encounter (Signed)
Pt called back in. I relayed the previous msg from Dr Jerline Pain. She stated she will not take the Keflex and is wanting to hear from Jerline Pain and only [employer]. She stated she does not want to take to a cma.

## 2021-10-29 ENCOUNTER — Other Ambulatory Visit: Payer: Self-pay | Admitting: *Deleted

## 2021-10-29 LAB — URINE CULTURE
MICRO NUMBER:: 13458425
SPECIMEN QUALITY:: ADEQUATE

## 2021-10-29 MED ORDER — NITROFURANTOIN MONOHYD MACRO 100 MG PO CAPS: 100.0000 mg | ORAL_CAPSULE | Freq: Two times a day (BID) | ORAL | 0 refills | Status: AC

## 2021-10-29 NOTE — Telephone Encounter (Signed)
Patient is calling back in regard.  I have informed her that we still need Dr. Jerline Pain to review culture.    Patient is requesting call back in regard at [phone] or [phone].

## 2021-10-29 NOTE — Telephone Encounter (Signed)
See results note. 

## 2021-10-29 NOTE — Progress Notes (Signed)
Please inform patient of the following:  Her urine culture confirms UTI.  The Keflex was sent and should treat this.  If she is does not wish to take this we can try Macrobid 100 mg twice daily x7 days.

## 2021-11-01 ENCOUNTER — Ambulatory Visit: Payer: Medicare Other

## 2021-11-01 NOTE — Chronic Care Management (AMB) (Signed)
  Care Management   Outreach Note  11/01/2021 Name: Alicia Vasquez [facility] MRN: [id number] DOB: May 11, 1941  Referred by: [name], MD Reason for referral : Care Coordination (Outreach to offer to schedule initial with [facility] )   Third unsuccessful telephone outreach was attempted today. The patient was referred to the case management team for assistance with care management and care coordination. The patient's primary care provider has been notified of our unsuccessful attempts to make or maintain contact with the patient. The care management team is pleased to engage with this patient at any time in the future should he/she be interested in assistance from the care management team.   Follow Up Plan:  We have been unable to make contact with the patient for follow up. The care management team is available to follow up with the patient after provider conversation with the patient regarding recommendation for care management engagement and subsequent re-referral to the care management team.   Noreene Larsson, [place], [place], [place] [address] Direct Dial: [phone] [email or web address]: [email or web address]

## 2021-11-02 ENCOUNTER — Encounter: Payer: Self-pay | Admitting: Physician Assistant

## 2021-11-02 ENCOUNTER — Telehealth: Payer: Self-pay | Admitting: Physician Assistant

## 2021-11-02 ENCOUNTER — Telehealth (INDEPENDENT_AMBULATORY_CARE_PROVIDER_SITE_OTHER): Payer: Medicare Other | Admitting: Physician Assistant

## 2021-11-02 VITALS — Ht 64.0 in | Wt 219.0 lb

## 2021-11-02 DIAGNOSIS — B9620 Unspecified Escherichia coli [E. coli] as the cause of diseases classified elsewhere: Secondary | ICD-10-CM

## 2021-11-02 DIAGNOSIS — R110 Nausea: Secondary | ICD-10-CM | POA: Diagnosis not present

## 2021-11-02 DIAGNOSIS — N390 Urinary tract infection, site not specified: Secondary | ICD-10-CM | POA: Diagnosis not present

## 2021-11-02 MED ORDER — ONDANSETRON 4 MG PO TBDP
4.0000 mg | ORAL_TABLET | Freq: Three times a day (TID) | ORAL | 0 refills | Status: DC | PRN
Start: 2021-11-02 — End: 2021-12-07

## 2021-11-02 NOTE — Progress Notes (Signed)
Virtual Visit via Telephone Note  I connected with Viktoria Gruetzmacher Brenn on 11/02/21 at 11:45 AM EDT by telephone and verified that I am speaking with the correct person using two identifiers.  Location: Patient: home Provider: Therapist, music at [facility], [name], joining phone call today as well.    I discussed the limitations, risks, security and privacy concerns of performing an evaluation and management service by telephone and the availability of in person appointments. I also discussed with the patient that there may be a patient responsible charge related to this service. The patient expressed understanding and agreed to proceed.   History of Present Illness:  Unable to get out of the house - lack of transportation today  81 year old female presents for a phone visit today to discuss new symptoms following UTI diagnosis from appointment 10/26/2021. Felt nauseated this morning, but no vomiting Headache, chills, diarrhea (x2) started today, body aches No fever reported (does not have a thermometer) Has not had anything to eat yet; has not had any of her medications this morning Still having some UTI symptoms Currently taking macrobid (started this two days ago) - not taken today Sipping ginger-ale today  Felt somewhat dizzy today, but able to transfer from wheelchair to toilet without difficulty though No new chest pain, no severe abdominal pain, no SOB    Observations/Objective:  Unable to obtain any vitals today per son  Son reports most of history today.  -Hear patient in background speaking clearly without pauses for breath  Assessment and Plan:  1. E-coli UTI Encouraged patient that she needs to continue to stay hydrated and finish out the entire course of Macrobid.  2. Nausea New main complaint today.  Plan to start Zofran 4 mg ODT as needed every 6-8 hours for nausea.  After administration, about 30 minutes, she should take her medications and  try to eat some crackers and sip on electrolyte solution.  Advised patient and her son over the phone today very low threshold to present to the emergency department if any acute worsening symptoms such as fainting, severe weakness, decreased urination, chest pain or shortness of breath.  I also strongly encouraged the son to call EMS to come out to have a quick look at her and check vitals.  Advised that UTIs can sometimes lead to sepsis, and a set of vitals would be very helpful as I am unable to check that over the phone today.  I asked that they would please call back tomorrow with an update.   Follow Up Instructions:    I discussed the assessment and treatment plan with the patient. The patient was provided an opportunity to ask questions and all were answered. The patient agreed with the plan and demonstrated an understanding of the instructions.   The patient was advised to call back or seek an in-person evaluation if the symptoms worsen or if the condition fails to improve as anticipated.  I provided 17 minutes & 34 seconds of non-face-to-face time during this encounter.   Sandon Yoho M Edvardo Honse, PA-C

## 2021-11-02 NOTE — Telephone Encounter (Signed)
There is no dose change recommendation for patients with renal impairment. It should be safe for her to take and she has been on it before.  Recommend she call her kidney doctor and ask for alternatives if needed.   Alicia Vasquez. Jerline Pain, MD 11/02/2021 3:27 PM

## 2021-11-02 NOTE — Telephone Encounter (Signed)
Patient states she can not take zofran that was called in by [facility].  States her kidney doctor advised her not to take this med.  Is requesting a call back.   Patient states script needs to go to [facility] on [address].  Patient request that I also send to [facility] team as well.

## 2021-11-02 NOTE — Telephone Encounter (Signed)
Please see pt msg and advise if you would like me to send in something different for patient

## 2021-11-03 NOTE — Telephone Encounter (Signed)
Patient called back wanting something different to take that wouldn't make her sick. Advised pt of PCP recommendations and that she can contact her kidney provider for alternative medication per PCP note. Pt verbalized understanding.

## 2021-11-03 NOTE — Telephone Encounter (Signed)
Noted and agreed, thank you. 

## 2021-11-03 NOTE — Telephone Encounter (Signed)
Called pt lvm with Jerline Pain and Alyssa recommendations, also left cb number for callback with any questions or concerns.

## 2021-11-04 ENCOUNTER — Other Ambulatory Visit: Payer: Self-pay | Admitting: Family Medicine

## 2021-11-08 ENCOUNTER — Encounter: Payer: Self-pay | Admitting: Adult Health

## 2021-11-08 ENCOUNTER — Other Ambulatory Visit: Payer: Self-pay | Admitting: Family Medicine

## 2021-11-08 ENCOUNTER — Telehealth: Payer: Self-pay | Admitting: Family Medicine

## 2021-11-08 ENCOUNTER — Inpatient Hospital Stay (HOSPITAL_BASED_OUTPATIENT_CLINIC_OR_DEPARTMENT_OTHER): Payer: Medicare Other | Admitting: Adult Health

## 2021-11-08 ENCOUNTER — Other Ambulatory Visit: Payer: Self-pay

## 2021-11-08 ENCOUNTER — Inpatient Hospital Stay: Payer: Medicare Other | Attending: Oncology

## 2021-11-08 ENCOUNTER — Inpatient Hospital Stay: Payer: Medicare Other

## 2021-11-08 VITALS — BP 163/77 | HR 90 | Temp 98.1°F | Resp 18 | Ht 64.0 in | Wt 217.6 lb

## 2021-11-08 DIAGNOSIS — C7951 Secondary malignant neoplasm of bone: Secondary | ICD-10-CM | POA: Diagnosis not present

## 2021-11-08 DIAGNOSIS — C50411 Malignant neoplasm of upper-outer quadrant of right female breast: Secondary | ICD-10-CM | POA: Insufficient documentation

## 2021-11-08 DIAGNOSIS — I82599 Chronic embolism and thrombosis of other specified deep vein of unspecified lower extremity: Secondary | ICD-10-CM | POA: Diagnosis not present

## 2021-11-08 DIAGNOSIS — Z6841 Body Mass Index (BMI) 40.0 and over, adult: Secondary | ICD-10-CM | POA: Diagnosis not present

## 2021-11-08 DIAGNOSIS — Z923 Personal history of irradiation: Secondary | ICD-10-CM | POA: Insufficient documentation

## 2021-11-08 DIAGNOSIS — Z8042 Family history of malignant neoplasm of prostate: Secondary | ICD-10-CM | POA: Insufficient documentation

## 2021-11-08 DIAGNOSIS — Z7901 Long term (current) use of anticoagulants: Secondary | ICD-10-CM | POA: Diagnosis not present

## 2021-11-08 DIAGNOSIS — Z170 Estrogen receptor positive status [ER+]: Secondary | ICD-10-CM

## 2021-11-08 DIAGNOSIS — I10 Essential (primary) hypertension: Secondary | ICD-10-CM | POA: Diagnosis not present

## 2021-11-08 DIAGNOSIS — N390 Urinary tract infection, site not specified: Secondary | ICD-10-CM | POA: Diagnosis not present

## 2021-11-08 DIAGNOSIS — Z86718 Personal history of other venous thrombosis and embolism: Secondary | ICD-10-CM | POA: Diagnosis not present

## 2021-11-08 DIAGNOSIS — Z800 Family history of malignant neoplasm of digestive organs: Secondary | ICD-10-CM | POA: Insufficient documentation

## 2021-11-08 DIAGNOSIS — E038 Other specified hypothyroidism: Secondary | ICD-10-CM

## 2021-11-08 DIAGNOSIS — M8580 Other specified disorders of bone density and structure, unspecified site: Secondary | ICD-10-CM | POA: Insufficient documentation

## 2021-11-08 LAB — CBC WITH DIFFERENTIAL/PLATELET
Abs Immature Granulocytes: 0.04 10*3/uL (ref 0.00–0.07)
Basophils Absolute: 0.1 10*3/uL (ref 0.0–0.1)
Basophils Relative: 1 %
Eosinophils Absolute: 0.3 10*3/uL (ref 0.0–0.5)
Eosinophils Relative: 3 %
HCT: 37.9 % (ref 36.0–46.0)
Hemoglobin: 12.2 g/dL (ref 12.0–15.0)
Immature Granulocytes: 0 %
Lymphocytes Relative: 14 %
Lymphs Abs: 1.3 10*3/uL (ref 0.7–4.0)
MCH: 28.2 pg (ref 26.0–34.0)
MCHC: 32.2 g/dL (ref 30.0–36.0)
MCV: 87.5 fL (ref 80.0–100.0)
Monocytes Absolute: 1.1 10*3/uL — ABNORMAL HIGH (ref 0.1–1.0)
Monocytes Relative: 12 %
Neutro Abs: 6.7 10*3/uL (ref 1.7–7.7)
Neutrophils Relative %: 70 %
Platelets: 313 10*3/uL (ref 150–400)
RBC: 4.33 MIL/uL (ref 3.87–5.11)
RDW: 14.6 % (ref 11.5–15.5)
WBC: 9.4 10*3/uL (ref 4.0–10.5)
nRBC: 0 % (ref 0.0–0.2)

## 2021-11-08 LAB — COMPREHENSIVE METABOLIC PANEL
ALT: 12 U/L (ref 0–44)
AST: 9 U/L — ABNORMAL LOW (ref 15–41)
Albumin: 3.8 g/dL (ref 3.5–5.0)
Alkaline Phosphatase: 97 U/L (ref 38–126)
Anion gap: 4 — ABNORMAL LOW (ref 5–15)
BUN: 15 mg/dL (ref 8–23)
CO2: 30 mmol/L (ref 22–32)
Calcium: 10.9 mg/dL — ABNORMAL HIGH (ref 8.9–10.3)
Chloride: 106 mmol/L (ref 98–111)
Creatinine, Ser: 0.88 mg/dL (ref 0.44–1.00)
GFR, Estimated: >60 mL/min (ref >60)
Glucose, Bld: 113 mg/dL — ABNORMAL HIGH (ref 70–99)
Potassium: 3.9 mmol/L (ref 3.5–5.1)
Sodium: 140 mmol/L (ref 135–145)
Total Bilirubin: 0.3 mg/dL (ref 0.3–1.2)
Total Protein: 6.8 g/dL (ref 6.5–8.1)

## 2021-11-08 LAB — PROTIME-INR
INR: 1.1 (ref 0.8–1.2)
Prothrombin Time: 14.4 seconds (ref 11.4–15.2)

## 2021-11-08 MED ORDER — KETOCONAZOLE 2 % EX CREA: 1.0000 "application " | TOPICAL_CREAM | Freq: Every day | CUTANEOUS | 0 refills | Status: DC

## 2021-11-08 NOTE — Telephone Encounter (Signed)
Spoke with Alicia Vasquez. Stated medication issue, Rx Keflex, was done  Patient taking Keflex for UTI

## 2021-11-08 NOTE — Telephone Encounter (Signed)
Cancer center called for this patient-. Mickel Baas RN called wanting to speak to Dr Ellwood Handler MA.re a recent encounter they had with patient today -  no MA avl (lunch hr)- Please call Mickel Baas at [phone] direct phone #.

## 2021-11-08 NOTE — Assessment & Plan Note (Signed)
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  She continues on fulvestrant given every 4 weeks.  She tolerates this well.  Alicia Vasquez's main issue today is not her cancer but a urinary tract infection she was diagnosed with last week.  My nurse called the pharmacist who clarified that her antibiotics were changed to an antibiotic that she could tolerate and she just needs to come and pick this up.  We verified this with [facility] today and she is going to go get her antibiotics and return in 1 week.  Because of how poorly Alicia Vasquez feels related to her urinary tract infection and instead of giving her her injection today she will instead return in 1 week.  She does not have any of her medications with her and does not know how she is taking her medications.  I asked that when she returns next week that she bring all of her medications because her INR is 1.1 and she should be taking Coumadin.  Alicia Vasquez will return next week for labs, follow-up, and her injection.

## 2021-11-08 NOTE — Assessment & Plan Note (Signed)
This was diagnosed in 2014 and she has been on Coumadin at varying doses since that time.  Her Coumadin dose has been challenging to manage due to her confusion about what medicine she should take.  I asked that she bring in all her medications next week since her INR is 1.1 and we will evaluate what she is taking.  She will continue her current Coumadin dosage this week.

## 2021-11-08 NOTE — Progress Notes (Signed)
[facility] Follow up:    Alicia Barrack, MD [address] [name] [address]   DIAGNOSIS:  Cancer Staging  Malignant neoplasm of upper-outer quadrant of right breast in female, estrogen receptor positive ([place]) Staging form: Breast, AJCC 7th Edition - Clinical: Stage IIA (T1, N1, cM0) - Unsigned Specimen type: Core Needle Biopsy Histopathologic type: 9931 Laterality: Right Staging comments: Staged at breast conference 07/24/13.  - Pathologic: Stage IV (M1) - Unsigned Specimen type: Core Needle Biopsy Histopathologic type: 9931 Laterality: Right   SUMMARY OF ONCOLOGIC HISTORY: 81 y.o. [place] woman status post right breast upper outer quadrant lumpectomy and sentinel lymph node sampling 09/09/2013 for an mpT1c pN1a, stage IIA invasive ductal carcinoma, estrogen and progesterone receptor both 100% positive with strong staining intensity, MIB-1 of 17% and no HER-2 amplification   (1) additional surgery for margin clearance 09/16/2013 obtained negative margins   (2) Oncotype DX recurrence score of 4 predicts a risk of outside the breast recurrence within 10 years of 7% if the patient's only systemic therapy is tamoxifen for 5 years. It also predicts no benefit from chemotherapy   (3) adjuvant radiation completed 01/07/2014   (4) anastrozole started 02/27/2014 stopped within 2 weeks because of arm swelling.              (a) bone density April 2016 showed osteopenia, with a t-score of -1.6             (b) anastrozole resumed 12/17/2015             (c) Bone density 09/20/2016 showed a T score of -2.2   (5) history of left lower extremity DVT 11/23/2012, initially on rivaroxaban, which caused chest pain, switch to Coumadin July 2014             (b) coumadin dose increased to 7.5/10 mg alternating days as of 06/13/2017    METASTATIC DISEASE: August 2018 (6) status post left total hip replacement 01/16/2017 for estrogen receptor positive adenocarcinoma.              (a) CA 27-29 was 46.4 as of 04/18/2017             (b) chest CT scan 05/02/2017 shows no lung or liver lesion concern; it does show aortic atherosclerosis             (c) baseline bone scan 05/02/2017 was negative             (d) PET scan 09/12/2017 shows no active disease, including bone             (e) PET scan and CT chest on 05/29/2018 show no active disease             (f) chest CT and bone scan 03/18/2019 showed no evidence of active disease             (g) lumbar spine MRI 01/06/2021 shows multiple compression fractures but no obvious metastases; noncontrast cervical spine and head CT scans 01/04/2021 showed no evidence of neoplastic disease   (7) fulvestrant started 04/18/2017   (8) denosumab/Xgeva started 05/16/2017             (a) changed to every 12-weeks after 10/03/2017 dose              (b) held starting with 06/13/2018 dose due to dental concerns   (9) unprovoked deep vein thrombosis involving the left posterior tibial v documented 11/23/2012, resolved on repeat 06/27/2013 and not recurrent  on multiple Dopplers since, most recent 03/06/2018             (a) on chronic anticoagulation with warfarin given ongoing risks (stage IV breast cancer, relative immobility secondary to claudication  CURRENT THERAPY: Fulvestrant  INTERVAL HISTORY: Alicia Vasquez 81 y.o. female returns for follow-up of her metastatic breast cancer.  She continues on treatment with fulvestrant given every 4 weeks.  She tolerates this treatment well.    Her most recent restaging scans were completed on July 29, 2021 and showed stable compression deformities in T12 L4 and L5, aortic atherosclerosis, coronary artery calcifications, and chronic findings that were unchanged and stable.  She also went underwent a bone scan on that day which demonstrated no evidence of bony metastasis however increasing radiotracer activity along the femoral component of the left hip arthroplasty questionably reflecting  prosthesis loosening and radiograph moderate distal femoral diaphyseal cortical thickening at the medial aspect of the distal femoral stem hardware new compared to April 17, 2018 radiographs.  This correlated with the area of increased uptake on the bone scan however was considered to be stress related changes.    Alicia Vasquez's biggest concern today is her urinary tract infection.  She tested positive for this about a week ago however is concerned about the antibiotic she was prescribed and has not yet picked them up.  Patient Active Problem List   Diagnosis Date Noted   GERD (gastroesophageal reflux disease) 06/17/2021   Bilateral leg edema 06/03/2021   Pressure injury of left buttock, stage 1 06/03/2021   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   Weakness 04/27/2021   Estrogen receptor positive status (ER+) 05/31/2019   Major depressive disorder, single episode, unspecified 05/31/2019   Morbid (severe) obesity due to excess calories ([place]) 05/31/2019   Neoplasm related pain (acute) (chronic) 05/31/2019   Aortic atherosclerosis ([place]) 06/13/2017   Pain from bone metastases ([place]) 01/25/2017   Malignant neoplasm metastatic to bone ([place]) 01/25/2017   Endometrial hyperplasia 01/16/2017   Lytic bone lesion of left femur 01/15/2017   Hip fracture, pathological ([place]) 01/15/2017   Lymphedema 09/17/2015   Long term current use of anticoagulant therapy 08/23/2015   DVT, lower extremity ([place]) 06/18/2015   Bilateral knee pain 04/03/2015   Arm edema 08/28/2014   Vitamin D deficiency 04/23/2014   Hyperparathyroidism ([place]) 04/23/2014   Depression 07/18/2013   Malignant neoplasm of upper-outer quadrant of right breast in female, estrogen receptor positive ([place]) 07/15/2013   Overactive bladder 01/30/2013   Primary hypercoagulable state ([place]) 12/19/2012   HTN (hypertension) 09/27/2011   Hearing loss 09/27/2011   Seasonal allergies 09/27/2011   Hypothyroid 08/26/2011    is allergic to anesthetics, amide; benadryl  [diphenhydramine hcl]; carbocaine [mepivacaine hcl]; codeine; epinephrine; sulfa antibiotics; diphenhydramine; latex; penicillins; and tramadol.  MEDICAL HISTORY: Past Medical History:  Diagnosis Date   Allergy    Anxiety    Arthritis    Blood transfusion without reported diagnosis    Breast cancer ([place]) 07/12/2013   Invasive Mammary Carcinoma   DVT (deep vein thrombosis) in pregnancy    Hypertension    Hypothyroid    Metastatic cancer to bone ([place]) dx'd 12/2016   hip   Personal history of radiation therapy    Pneumonia    PONV (postoperative nausea and vomiting)    Radiation 11/21/13-01/07/14   Right Breast/Supraclavicular    SURGICAL HISTORY: Past Surgical History:  Procedure Laterality Date   BREAST LUMPECTOMY Left 2015   BREAST LUMPECTOMY WITH RADIOACTIVE SEED LOCALIZATION Right 09/09/2013   Procedure:  BREAST LUMPECTOMY WITH RADIOACTIVE SEED LOCALIZATION WITH AXILLARY NODE EXCISION;  Surgeon: [name], MD;  Location: [facility];  Service: General;  Laterality: Right;   CYSTOSCOPY W/ URETERAL STENT PLACEMENT Right 01/07/2021   Procedure: CYSTOSCOPY WITH RETROGRADE PYELOGRAM/URETERAL STENT PLACEMENT;  Surgeon: Bjorn Loser, MD;  Location: [facility];  Service: Urology;  Laterality: Right;   CYSTOSCOPY/URETEROSCOPY/HOLMIUM LASER/STENT PLACEMENT Right 02/08/2021   Procedure: CYSTOSCOPY, RIGHT URETEROSCOPY, RIGHT RETRGRADE PYELOGRAM, HOLMIUM LASER/STENT PLACEMENT;  Surgeon: Lucas Mallow, MD;  Location: [facility];  Service: Urology;  Laterality: Right;   DENTAL SURGERY  04/19/2012   13 TEETH REMOVED   DILATION AND CURETTAGE OF UTERUS     IR RADIOLOGIST EVAL & MGMT  01/21/2021   ORIF PERIPROSTHETIC FRACTURE Left 01/31/2017   Procedure: REVISION and OPEN REDUCTION INTERNAL FIXATION (ORIF) PERIPROSTHETIC FRACTURE LEFT HIP;  Surgeon: Paralee Cancel, MD;  Location: [facility];  Service: Orthopedics;  Laterality: Left;  120 mins   RE-EXCISION OF BREAST LUMPECTOMY Right  09/24/2013   Procedure: RE-EXCISION OF RIGHT BREAST LUMPECTOMY;  Surgeon: [name], MD;  Location: [facility];  Service: General;  Laterality: Right;   TOTAL HIP ARTHROPLASTY Left 01/16/2017   Procedure: TOTAL HIP ARTHROPLASTY POSTERIOR;  Surgeon: Paralee Cancel, MD;  Location: [facility];  Service: Orthopedics;  Laterality: Left;    SOCIAL HISTORY: Social History   Socioeconomic History   Marital status: Widowed    Spouse name: Not on file   Number of children: 1   Years of education: Not on file   Highest education level: Not on file  Occupational History   Not on file  Tobacco Use   Smoking status: Never   Smokeless tobacco: Never  Vaping Use   Vaping Use: Never used  Substance and Sexual Activity   Alcohol use: No    Alcohol/week: 0.0 standard drinks of alcohol   Drug use: No   Sexual activity: Never  Other Topics Concern   Not on file  Social History Narrative   Exercise: yard work.   Social Determinants of Health   Financial Resource Strain: Medium Risk (09/16/2021)   Overall Financial Resource Strain (CARDIA)    Difficulty of Paying Living Expenses: Somewhat hard  Food Insecurity: No Food Insecurity (09/13/2021)   Hunger Vital Sign    Worried About Running Out of Food in the Last Year: Never true    Ran Out of Food in the Last Year: Never true  Transportation Needs: No Transportation Needs (09/13/2021)   PRAPARE - Hydrologist (Medical): No    Lack of Transportation (Non-Medical): No  Physical Activity: Inactive (09/13/2021)   Exercise Vital Sign    Days of Exercise per Week: 0 days    Minutes of Exercise per Session: 0 min  Stress: No Stress Concern Present (09/13/2021)   [place]    Feeling of Stress : Only a little  Social Connections: Socially Isolated (09/13/2021)   Social Connection and Isolation Panel [NHANES]    Frequency of Communication with  Friends and Family: More than three times a week    Frequency of Social Gatherings with Friends and Family: Three times a week    Attends Religious Services: Never    Active Member of Clubs or Organizations: No    Attends Archivist Meetings: Never    Marital Status: Widowed  Intimate Partner Violence: Not At Risk (09/13/2021)   Humiliation, Afraid, Rape, and Kick  questionnaire    Fear of Current or Ex-Partner: No    Emotionally Abused: No    Physically Abused: No    Sexually Abused: No    FAMILY HISTORY: Family History  Problem Relation Age of Onset   Heart disease Brother    Colon cancer Brother    Prostate cancer Brother     Review of Systems  Constitutional:  Negative for appetite change, chills, fatigue, fever and unexpected weight change.  HENT:   Negative for hearing loss, lump/mass and trouble swallowing.   Eyes:  Negative for eye problems and icterus.  Respiratory:  Negative for chest tightness, cough and shortness of breath.   Cardiovascular:  Negative for chest pain, leg swelling and palpitations.  Gastrointestinal:  Negative for abdominal distention, abdominal pain, constipation, diarrhea, nausea and vomiting.  Endocrine: Negative for hot flashes.  Genitourinary:  Negative for difficulty urinating.   Musculoskeletal:  Negative for arthralgias.  Skin:  Negative for itching and rash.  Neurological:  Negative for dizziness, extremity weakness, headaches and numbness.  Hematological:  Negative for adenopathy. Does not bruise/bleed easily.  Psychiatric/Behavioral:  Negative for depression. The patient is not nervous/anxious.       PHYSICAL EXAMINATION  ECOG PERFORMANCE STATUS: 2 - Symptomatic, <50% confined to bed  Vitals:   11/08/21 1152  BP: (!) 163/77  Pulse: 90  Resp: 18  Temp: 98.1 F (36.7 C)  SpO2: 98%    Physical Exam Constitutional:      General: She is not in acute distress.    Appearance: Normal appearance. She is not toxic-appearing.   HENT:     Head: Normocephalic and atraumatic.  Eyes:     General: No scleral icterus. Cardiovascular:     Rate and Rhythm: Normal rate and regular rhythm.     Pulses: Normal pulses.     Heart sounds: Normal heart sounds.  Pulmonary:     Effort: Pulmonary effort is normal.     Breath sounds: Normal breath sounds.  Abdominal:     General: Abdomen is flat. Bowel sounds are normal. There is no distension.     Palpations: Abdomen is soft.     Tenderness: There is no abdominal tenderness.  Musculoskeletal:        General: No swelling.     Cervical back: Neck supple.  Lymphadenopathy:     Cervical: No cervical adenopathy.  Skin:    General: Skin is warm and dry.     Findings: No rash.  Neurological:     General: No focal deficit present.     Mental Status: She is alert.  Psychiatric:        Mood and Affect: Mood normal.        Behavior: Behavior normal.     LABORATORY DATA:  CBC    Component Value Date/Time   WBC 9.4 11/08/2021 1140   RBC 4.33 11/08/2021 1140   HGB 12.2 11/08/2021 1140   HGB 12.4 06/14/2021 1049   HGB 11.6 05/16/2017 1417   HCT 37.9 11/08/2021 1140   HCT 36.7 05/16/2017 1417   PLT 313 11/08/2021 1140   PLT 236 06/14/2021 1049   PLT 323 05/16/2017 1417   MCV 87.5 11/08/2021 1140   MCV 80.8 05/16/2017 1417   MCH 28.2 11/08/2021 1140   MCHC 32.2 11/08/2021 1140   RDW 14.6 11/08/2021 1140   RDW 16.8 (H) 05/16/2017 1417   LYMPHSABS 1.3 11/08/2021 1140   LYMPHSABS 1.1 05/16/2017 1417   MONOABS 1.1 (H) 11/08/2021 1140  MONOABS 0.8 05/16/2017 1417   EOSABS 0.3 11/08/2021 1140   EOSABS 0.2 05/16/2017 1417   BASOSABS 0.1 11/08/2021 1140   BASOSABS 0.1 05/16/2017 1417    CMP     Component Value Date/Time   NA 140 11/08/2021 1140   NA 141 05/16/2017 1416   K 3.9 11/08/2021 1140   K 3.8 05/16/2017 1416   CL 106 11/08/2021 1140   CO2 30 11/08/2021 1140   CO2 27 05/16/2017 1416   GLUCOSE 113 (H) 11/08/2021 1140   GLUCOSE 140 05/16/2017 1416    BUN 15 11/08/2021 1140   BUN 13.2 05/16/2017 1416   CREATININE 0.88 11/08/2021 1140   CREATININE 0.95 06/14/2021 1049   CREATININE 0.80 09/22/2017 1555   CREATININE 0.8 05/16/2017 1416   CALCIUM 10.9 (H) 11/08/2021 1140   CALCIUM 11.0 (H) 05/16/2017 1416   PROT 6.8 11/08/2021 1140   PROT 6.9 05/16/2017 1416   ALBUMIN 3.8 11/08/2021 1140   ALBUMIN 3.6 05/16/2017 1416   AST 9 (L) 11/08/2021 1140   AST 10 (L) 06/14/2021 1049   AST 13 05/16/2017 1416   ALT 12 11/08/2021 1140   ALT 10 06/14/2021 1049   ALT <6 05/16/2017 1416   ALKPHOS 97 11/08/2021 1140   ALKPHOS 130 05/16/2017 1416   BILITOT 0.3 11/08/2021 1140   BILITOT 0.4 06/14/2021 1049   BILITOT 0.31 05/16/2017 1416   GFRNONAA >60 11/08/2021 1140   GFRNONAA >60 06/14/2021 1049   GFRNONAA 75 08/19/2015 1602   GFRAA >60 08/07/2019 1305   GFRAA >60 03/21/2018 1417   GFRAA 87 08/19/2015 1602    ASSESSMENT and THERAPY PLAN:   Malignant neoplasm of upper-outer quadrant of right breast in female, estrogen receptor positive (HCC) Nazariah Cadet is an 81 year old woman who is here today for follow-up of her estrogen positive metastatic breast cancer to the bone.  She continues on fulvestrant given every 4 weeks.  She tolerates this well.  Jannelle's main issue today is not her cancer but a urinary tract infection she was diagnosed with last week.  My nurse called the pharmacist who clarified that her antibiotics were changed to an antibiotic that she could tolerate and she just needs to come and pick this up.  We verified this with [facility] today and she is going to go get her antibiotics and return in 1 week.  Because of how poorly Taneah feels related to her urinary tract infection and instead of giving her her injection today she will instead return in 1 week.  She does not have any of her medications with her and does not know how she is taking her medications.  I asked that when she returns next week that she bring all of her medications  because her INR is 1.1 and she should be taking Coumadin.  Tameko will return next week for labs, follow-up, and her injection.  DVT, lower extremity ([place]) This was diagnosed in 2014 and she has been on Coumadin at varying doses since that time.  Her Coumadin dose has been challenging to manage due to her confusion about what medicine she should take.  I asked that she bring in all her medications next week since her INR is 1.1 and we will evaluate what she is taking.  She will continue her current Coumadin dosage this week.  Morbid (severe) obesity due to excess calories (HCC) Ashima is morbidly obese which impacts her risk for blood clots, her mobility, and her difficulty with hygiene.  Her increased skin folds  have Candida in between and I sent in ketoconazole cream for her fungal rash.  We discussed calorie limitation and activity recommendations today.  Tnya will return in 1 week for labs, follow-up, her next injection.  All questions were answered. The patient knows to call the clinic with any problems, questions or concerns. We can certainly see the patient much sooner if necessary.  Total encounter time:30 minutes*in face-to-face visit time, chart review, lab review, care coordination, order entry, and documentation of the encounter time.    Wilber Bihari, NP 11/08/21 2:29 PM Medical Oncology and Hematology [facility] [place], [place] [address] Tel. [phone]    Fax. [phone]  *Total Encounter Time as defined by the Centers for Medicare and Medicaid Services includes, in addition to the face-to-face time of a patient visit (documented in the note above) non-face-to-face time: obtaining and reviewing outside history, ordering and reviewing medications, tests or procedures, care coordination (communications with other health care professionals or caregivers) and documentation in the medical record.

## 2021-11-08 NOTE — Assessment & Plan Note (Signed)
Alicia Vasquez is morbidly obese which impacts her risk for blood clots, her mobility, and her difficulty with hygiene.  Her increased skin folds have Candida in between and I sent in ketoconazole cream for her fungal rash.  We discussed calorie limitation and activity recommendations today.

## 2021-11-09 ENCOUNTER — Telehealth: Payer: Self-pay | Admitting: Adult Health

## 2021-11-09 NOTE — Telephone Encounter (Signed)
Scheduled appointment per 6/12 los. Called patient but the mailbox was full. Called patients friend [name] and left a message with the appointment information.

## 2021-11-10 ENCOUNTER — Telehealth: Payer: Self-pay

## 2021-11-10 NOTE — Telephone Encounter (Signed)
-----   Message from Gardenia Phlegm, NP sent at 11/08/2021  1:35 PM EDT ----- Patient's INR is off.  Can we please get a fill history from her pharmacy? ----- Message ----- From: [place]: 11/08/2021  12:43 PM EDT To: Gardenia Phlegm, NP

## 2021-11-10 NOTE — Telephone Encounter (Signed)
Spoke with [facility] and provided our direct fax. Phx tech states she will fax over hx, but she states pt never picked up coumadin 5 mg.

## 2021-11-11 DIAGNOSIS — N3000 Acute cystitis without hematuria: Secondary | ICD-10-CM | POA: Diagnosis not present

## 2021-11-11 DIAGNOSIS — R350 Frequency of micturition: Secondary | ICD-10-CM | POA: Diagnosis not present

## 2021-11-11 DIAGNOSIS — N3020 Other chronic cystitis without hematuria: Secondary | ICD-10-CM | POA: Diagnosis not present

## 2021-11-16 ENCOUNTER — Inpatient Hospital Stay: Payer: Medicare Other

## 2021-11-16 ENCOUNTER — Inpatient Hospital Stay (HOSPITAL_BASED_OUTPATIENT_CLINIC_OR_DEPARTMENT_OTHER): Payer: Medicare Other | Admitting: Adult Health

## 2021-11-16 ENCOUNTER — Other Ambulatory Visit: Payer: Self-pay

## 2021-11-16 ENCOUNTER — Encounter: Payer: Self-pay | Admitting: Adult Health

## 2021-11-16 VITALS — BP 160/84 | HR 81 | Temp 98.1°F | Resp 18 | Ht 64.0 in | Wt 220.4 lb

## 2021-11-16 DIAGNOSIS — Z170 Estrogen receptor positive status [ER+]: Secondary | ICD-10-CM | POA: Diagnosis not present

## 2021-11-16 DIAGNOSIS — C7951 Secondary malignant neoplasm of bone: Secondary | ICD-10-CM

## 2021-11-16 DIAGNOSIS — M8580 Other specified disorders of bone density and structure, unspecified site: Secondary | ICD-10-CM | POA: Diagnosis not present

## 2021-11-16 DIAGNOSIS — T1490XA Injury, unspecified, initial encounter: Secondary | ICD-10-CM

## 2021-11-16 DIAGNOSIS — N390 Urinary tract infection, site not specified: Secondary | ICD-10-CM | POA: Diagnosis not present

## 2021-11-16 DIAGNOSIS — C50411 Malignant neoplasm of upper-outer quadrant of right female breast: Secondary | ICD-10-CM | POA: Diagnosis not present

## 2021-11-16 DIAGNOSIS — I10 Essential (primary) hypertension: Secondary | ICD-10-CM | POA: Diagnosis not present

## 2021-11-16 LAB — CBC WITH DIFFERENTIAL/PLATELET
Abs Immature Granulocytes: 0.03 10*3/uL (ref 0.00–0.07)
Basophils Absolute: 0.1 10*3/uL (ref 0.0–0.1)
Basophils Relative: 1 %
Eosinophils Absolute: 0.3 10*3/uL (ref 0.0–0.5)
Eosinophils Relative: 4 %
HCT: 38.9 % (ref 36.0–46.0)
Hemoglobin: 12.6 g/dL (ref 12.0–15.0)
Immature Granulocytes: 0 %
Lymphocytes Relative: 17 %
Lymphs Abs: 1.4 10*3/uL (ref 0.7–4.0)
MCH: 28.7 pg (ref 26.0–34.0)
MCHC: 32.4 g/dL (ref 30.0–36.0)
MCV: 88.6 fL (ref 80.0–100.0)
Monocytes Absolute: 0.9 10*3/uL (ref 0.1–1.0)
Monocytes Relative: 11 %
Neutro Abs: 5.4 10*3/uL (ref 1.7–7.7)
Neutrophils Relative %: 67 %
Platelets: 303 10*3/uL (ref 150–400)
RBC: 4.39 MIL/uL (ref 3.87–5.11)
RDW: 14.8 % (ref 11.5–15.5)
WBC: 8.1 10*3/uL (ref 4.0–10.5)
nRBC: 0 % (ref 0.0–0.2)

## 2021-11-16 LAB — COMPREHENSIVE METABOLIC PANEL
ALT: 11 U/L (ref 0–44)
AST: 11 U/L — ABNORMAL LOW (ref 15–41)
Albumin: 3.8 g/dL (ref 3.5–5.0)
Alkaline Phosphatase: 128 U/L — ABNORMAL HIGH (ref 38–126)
Anion gap: 4 — ABNORMAL LOW (ref 5–15)
BUN: 14 mg/dL (ref 8–23)
CO2: 30 mmol/L (ref 22–32)
Calcium: 10.6 mg/dL — ABNORMAL HIGH (ref 8.9–10.3)
Chloride: 106 mmol/L (ref 98–111)
Creatinine, Ser: 0.93 mg/dL (ref 0.44–1.00)
GFR, Estimated: >60 mL/min (ref >60)
Glucose, Bld: 105 mg/dL — ABNORMAL HIGH (ref 70–99)
Potassium: 3.8 mmol/L (ref 3.5–5.1)
Sodium: 140 mmol/L (ref 135–145)
Total Bilirubin: 0.2 mg/dL — ABNORMAL LOW (ref 0.3–1.2)
Total Protein: 6.8 g/dL (ref 6.5–8.1)

## 2021-11-16 LAB — PROTIME-INR
INR: 1.5 — ABNORMAL HIGH (ref 0.8–1.2)
Prothrombin Time: 18.1 seconds — ABNORMAL HIGH (ref 11.4–15.2)

## 2021-11-16 MED ORDER — WARFARIN SODIUM 5 MG PO TABS: ORAL_TABLET | ORAL | 1 refills | Status: DC

## 2021-11-16 MED ORDER — WARFARIN SODIUM 2.5 MG PO TABS: ORAL_TABLET | ORAL | 3 refills | Status: DC
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
## 2021-11-17 ENCOUNTER — Telehealth: Payer: Self-pay | Admitting: Adult Health

## 2021-11-17 NOTE — Telephone Encounter (Signed)
Scheduled appointment per 6/20 los. Left message.

## 2021-11-18 ENCOUNTER — Encounter: Payer: Self-pay | Admitting: Oncology
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
## 2021-11-19 ENCOUNTER — Other Ambulatory Visit: Payer: Self-pay | Admitting: Hematology and Oncology

## 2021-11-19 DIAGNOSIS — Z170 Estrogen receptor positive status [ER+]: Secondary | ICD-10-CM

## 2021-11-19 DIAGNOSIS — C7951 Secondary malignant neoplasm of bone: Secondary | ICD-10-CM

## 2021-11-19 MED ORDER — HYDROCODONE-ACETAMINOPHEN 5-325 MG PO TABS: 1.0000 | ORAL_TABLET | Freq: Four times a day (QID) | ORAL | 0 refills | Status: DC | PRN

## 2021-11-23 ENCOUNTER — Other Ambulatory Visit: Payer: Self-pay

## 2021-11-23 ENCOUNTER — Inpatient Hospital Stay: Payer: Medicare Other

## 2021-11-23 DIAGNOSIS — C7951 Secondary malignant neoplasm of bone: Secondary | ICD-10-CM | POA: Diagnosis not present

## 2021-11-23 DIAGNOSIS — N390 Urinary tract infection, site not specified: Secondary | ICD-10-CM | POA: Diagnosis not present

## 2021-11-23 DIAGNOSIS — I10 Essential (primary) hypertension: Secondary | ICD-10-CM | POA: Diagnosis not present

## 2021-11-23 DIAGNOSIS — M8580 Other specified disorders of bone density and structure, unspecified site: Secondary | ICD-10-CM | POA: Diagnosis not present

## 2021-11-23 DIAGNOSIS — C50411 Malignant neoplasm of upper-outer quadrant of right female breast: Secondary | ICD-10-CM | POA: Diagnosis not present

## 2021-11-23 DIAGNOSIS — T1490XA Injury, unspecified, initial encounter: Secondary | ICD-10-CM

## 2021-11-23 LAB — CBC WITH DIFFERENTIAL/PLATELET
Abs Immature Granulocytes: 0.03 10*3/uL (ref 0.00–0.07)
Basophils Absolute: 0.1 10*3/uL (ref 0.0–0.1)
Basophils Relative: 1 %
Eosinophils Absolute: 0.3 10*3/uL (ref 0.0–0.5)
Eosinophils Relative: 3 %
HCT: 39.8 % (ref 36.0–46.0)
Hemoglobin: 13.1 g/dL (ref 12.0–15.0)
Immature Granulocytes: 0 %
Lymphocytes Relative: 14 %
Lymphs Abs: 1.4 10*3/uL (ref 0.7–4.0)
MCH: 28.8 pg (ref 26.0–34.0)
MCHC: 32.9 g/dL (ref 30.0–36.0)
MCV: 87.5 fL (ref 80.0–100.0)
Monocytes Absolute: 1.2 10*3/uL — ABNORMAL HIGH (ref 0.1–1.0)
Monocytes Relative: 12 %
Neutro Abs: 7.1 10*3/uL (ref 1.7–7.7)
Neutrophils Relative %: 70 %
Platelets: 253 10*3/uL (ref 150–400)
RBC: 4.55 MIL/uL (ref 3.87–5.11)
RDW: 15.4 % (ref 11.5–15.5)
WBC: 10 10*3/uL (ref 4.0–10.5)
nRBC: 0 % (ref 0.0–0.2)

## 2021-11-23 LAB — COMPREHENSIVE METABOLIC PANEL
ALT: 11 U/L (ref 0–44)
AST: 12 U/L — ABNORMAL LOW (ref 15–41)
Albumin: 3.9 g/dL (ref 3.5–5.0)
Alkaline Phosphatase: 130 U/L — ABNORMAL HIGH (ref 38–126)
Anion gap: 4 — ABNORMAL LOW (ref 5–15)
BUN: 17 mg/dL (ref 8–23)
CO2: 29 mmol/L (ref 22–32)
Calcium: 10.6 mg/dL — ABNORMAL HIGH (ref 8.9–10.3)
Chloride: 105 mmol/L (ref 98–111)
Creatinine, Ser: 0.84 mg/dL (ref 0.44–1.00)
GFR, Estimated: >60 mL/min (ref >60)
Glucose, Bld: 101 mg/dL — ABNORMAL HIGH (ref 70–99)
Potassium: 3.9 mmol/L (ref 3.5–5.1)
Sodium: 138 mmol/L (ref 135–145)
Total Bilirubin: 0.4 mg/dL (ref 0.3–1.2)
Total Protein: 6.7 g/dL (ref 6.5–8.1)

## 2021-11-23 LAB — PROTIME-INR
INR: 3.2 — ABNORMAL HIGH (ref 0.8–1.2)
Prothrombin Time: 32.6 seconds — ABNORMAL HIGH (ref 11.4–15.2)

## 2021-11-24 ENCOUNTER — Telehealth: Payer: Self-pay

## 2021-11-24 NOTE — Telephone Encounter (Signed)
Called pt to advise of results and recommendation. Pt did not answer. LVM for return call. Schedule message sent for pt to f/u with labs and [place] visit.

## 2021-11-24 NOTE — Telephone Encounter (Signed)
Attempted to call pt to give instructions per below. LVM for call back. Message sent to scheduling for appt with [facility] and labs.

## 2021-11-24 NOTE — Telephone Encounter (Signed)
-----   Message from Gardenia Phlegm, NP sent at 11/23/2021  2:44 PM EDT ----- INR is good, please tell patient to continue current dosage of 7.'5mg'$  daily, and we need to recheck on 7/11 ----- Message ----- From: Interface, Lab In [place]: 11/23/2021  12:30 PM EDT To: Gardenia Phlegm, NP

## 2021-11-24 NOTE — Telephone Encounter (Signed)
-----   Message from Gardenia Phlegm, NP sent at 11/23/2021  2:44 PM EDT ----- INR is good, please tell patient to continue current dosage of 7.'5mg'$  daily, and we need to recheck on 7/11 ----- Message ----- From: Interface, Lab In [place]: 11/23/2021  12:30 PM EDT To: Gardenia Phlegm, NP

## 2021-11-29 ENCOUNTER — Ambulatory Visit: Payer: Medicare Other

## 2021-12-02 ENCOUNTER — Telehealth: Payer: Self-pay | Admitting: *Deleted

## 2021-12-06 ENCOUNTER — Ambulatory Visit: Payer: Medicare Other

## 2021-12-06 ENCOUNTER — Other Ambulatory Visit: Payer: Medicare Other

## 2021-12-07 ENCOUNTER — Other Ambulatory Visit: Payer: Self-pay

## 2021-12-07 ENCOUNTER — Inpatient Hospital Stay: Payer: Medicare Other

## 2021-12-07 ENCOUNTER — Inpatient Hospital Stay: Payer: Medicare Other | Attending: Oncology

## 2021-12-07 ENCOUNTER — Inpatient Hospital Stay (HOSPITAL_BASED_OUTPATIENT_CLINIC_OR_DEPARTMENT_OTHER): Payer: Medicare Other | Admitting: Physician Assistant

## 2021-12-07 VITALS — BP 181/86 | HR 87 | Temp 97.7°F | Resp 16 | Wt 223.9 lb

## 2021-12-07 DIAGNOSIS — R110 Nausea: Secondary | ICD-10-CM | POA: Insufficient documentation

## 2021-12-07 DIAGNOSIS — Z5111 Encounter for antineoplastic chemotherapy: Secondary | ICD-10-CM | POA: Diagnosis not present

## 2021-12-07 DIAGNOSIS — M8580 Other specified disorders of bone density and structure, unspecified site: Secondary | ICD-10-CM | POA: Insufficient documentation

## 2021-12-07 DIAGNOSIS — C50411 Malignant neoplasm of upper-outer quadrant of right female breast: Secondary | ICD-10-CM | POA: Insufficient documentation

## 2021-12-07 DIAGNOSIS — I10 Essential (primary) hypertension: Secondary | ICD-10-CM | POA: Insufficient documentation

## 2021-12-07 DIAGNOSIS — Z800 Family history of malignant neoplasm of digestive organs: Secondary | ICD-10-CM | POA: Insufficient documentation

## 2021-12-07 DIAGNOSIS — M84659P Pathological fracture in other disease, hip, unspecified, subsequent encounter for fracture with malunion: Secondary | ICD-10-CM

## 2021-12-07 DIAGNOSIS — Z96642 Presence of left artificial hip joint: Secondary | ICD-10-CM | POA: Insufficient documentation

## 2021-12-07 DIAGNOSIS — C7951 Secondary malignant neoplasm of bone: Secondary | ICD-10-CM

## 2021-12-07 DIAGNOSIS — E038 Other specified hypothyroidism: Secondary | ICD-10-CM

## 2021-12-07 DIAGNOSIS — Z8042 Family history of malignant neoplasm of prostate: Secondary | ICD-10-CM | POA: Diagnosis not present

## 2021-12-07 DIAGNOSIS — Z7901 Long term (current) use of anticoagulants: Secondary | ICD-10-CM | POA: Insufficient documentation

## 2021-12-07 DIAGNOSIS — B372 Candidiasis of skin and nail: Secondary | ICD-10-CM | POA: Diagnosis not present

## 2021-12-07 DIAGNOSIS — Z923 Personal history of irradiation: Secondary | ICD-10-CM | POA: Insufficient documentation

## 2021-12-07 DIAGNOSIS — Z170 Estrogen receptor positive status [ER+]: Secondary | ICD-10-CM | POA: Diagnosis not present

## 2021-12-07 DIAGNOSIS — N390 Urinary tract infection, site not specified: Secondary | ICD-10-CM | POA: Diagnosis present

## 2021-12-07 DIAGNOSIS — B49 Unspecified mycosis: Secondary | ICD-10-CM | POA: Diagnosis not present

## 2021-12-07 DIAGNOSIS — Z86718 Personal history of other venous thrombosis and embolism: Secondary | ICD-10-CM | POA: Insufficient documentation

## 2021-12-07 DIAGNOSIS — M899 Disorder of bone, unspecified: Secondary | ICD-10-CM

## 2021-12-07 LAB — COMPREHENSIVE METABOLIC PANEL
ALT: 11 U/L (ref 0–44)
AST: 13 U/L — ABNORMAL LOW (ref 15–41)
Albumin: 4 g/dL (ref 3.5–5.0)
Alkaline Phosphatase: 122 U/L (ref 38–126)
Anion gap: 5 (ref 5–15)
BUN: 13 mg/dL (ref 8–23)
CO2: 29 mmol/L (ref 22–32)
Calcium: 11 mg/dL — ABNORMAL HIGH (ref 8.9–10.3)
Chloride: 104 mmol/L (ref 98–111)
Creatinine, Ser: 0.89 mg/dL (ref 0.44–1.00)
GFR, Estimated: >60 mL/min (ref >60)
Glucose, Bld: 108 mg/dL — ABNORMAL HIGH (ref 70–99)
Potassium: 4.2 mmol/L (ref 3.5–5.1)
Sodium: 138 mmol/L (ref 135–145)
Total Bilirubin: 0.4 mg/dL (ref 0.3–1.2)
Total Protein: 6.8 g/dL (ref 6.5–8.1)

## 2021-12-07 LAB — CBC WITH DIFFERENTIAL/PLATELET
Abs Immature Granulocytes: 0.02 10*3/uL (ref 0.00–0.07)
Basophils Absolute: 0.1 10*3/uL (ref 0.0–0.1)
Basophils Relative: 1 %
Eosinophils Absolute: 0.2 10*3/uL (ref 0.0–0.5)
Eosinophils Relative: 3 %
HCT: 39.5 % (ref 36.0–46.0)
Hemoglobin: 12.7 g/dL (ref 12.0–15.0)
Immature Granulocytes: 0 %
Lymphocytes Relative: 21 %
Lymphs Abs: 1.5 10*3/uL (ref 0.7–4.0)
MCH: 28.3 pg (ref 26.0–34.0)
MCHC: 32.2 g/dL (ref 30.0–36.0)
MCV: 88 fL (ref 80.0–100.0)
Monocytes Absolute: 1 10*3/uL (ref 0.1–1.0)
Monocytes Relative: 13 %
Neutro Abs: 4.4 10*3/uL (ref 1.7–7.7)
Neutrophils Relative %: 62 %
Platelets: 250 10*3/uL (ref 150–400)
RBC: 4.49 MIL/uL (ref 3.87–5.11)
RDW: 15.8 % — ABNORMAL HIGH (ref 11.5–15.5)
WBC: 7.2 10*3/uL (ref 4.0–10.5)
nRBC: 0 % (ref 0.0–0.2)

## 2021-12-07 LAB — PROTIME-INR
INR: 1.3 — ABNORMAL HIGH (ref 0.8–1.2)
Prothrombin Time: 15.8 seconds — ABNORMAL HIGH (ref 11.4–15.2)

## 2021-12-07 MED ORDER — ONDANSETRON HCL 8 MG PO TABS: 8.0000 mg | ORAL_TABLET | Freq: Three times a day (TID) | ORAL | 0 refills | Status: DC | PRN

## 2021-12-07 MED ORDER — FULVESTRANT 250 MG/5ML IM SOSY
500.0000 mg | PREFILLED_SYRINGE | Freq: Once | INTRAMUSCULAR | Status: AC
  Administered 2021-12-07: 500 mg via INTRAMUSCULAR
  Filled 2021-12-07: qty 10

## 2021-12-07 MED ORDER — WARFARIN SODIUM 5 MG PO TABS: ORAL_TABLET | ORAL | 1 refills | Status: DC

## 2021-12-07 MED ORDER — WARFARIN SODIUM 2.5 MG PO TABS: ORAL_TABLET | ORAL | 3 refills | Status: DC

## 2021-12-07 MED ORDER — KETOCONAZOLE 2 % EX CREA: 1.0000 | TOPICAL_CREAM | Freq: Every day | CUTANEOUS | 0 refills | Status: DC
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
## 2021-12-08 ENCOUNTER — Telehealth: Payer: Self-pay | Admitting: *Deleted

## 2021-12-08 ENCOUNTER — Telehealth: Payer: Self-pay | Admitting: Hematology and Oncology

## 2021-12-08 NOTE — Telephone Encounter (Signed)
Scheduled per 7/11 los, pt has been called, pt said they can not hear on the phone, tried calling other numbers listed but no answer, will mail calender

## 2021-12-08 NOTE — Telephone Encounter (Signed)
This RN spoke with Alicia Vasquez per need to have labs checked next week.  This RN inquired with Alicia Vasquez how she was told to take her coumadin per visit yesterday with Alicia Vasquez stating "just keep doing what I have been doing 7.'5mg'$  alternating with '5mg'$ ."  This RN informed pt above is not correct and need her to write down recommended dosing .  Alicia Vasquez states " ok let me write it down "  This RN informed her of recommended dose of '10mg'$  Mon/Wed/Fri and 7.5 mg all other days with request for the patient to repeat back instructions.  Alicia Vasquez verbalized back correct instructions- as well appt date and time for next week.

## 2021-12-13 ENCOUNTER — Telehealth: Payer: Self-pay

## 2021-12-13 ENCOUNTER — Other Ambulatory Visit: Payer: Self-pay | Admitting: Physician Assistant

## 2021-12-13 DIAGNOSIS — Z7901 Long term (current) use of anticoagulants: Secondary | ICD-10-CM

## 2021-12-13 NOTE — Telephone Encounter (Signed)
Pt called and reports she has "blister that itch" to her neck, back, abdomen and right leg. She reports they itch but one of the blisters looks "puss-filled." She wonders if they are bug bites or something different. Pt agreed to come in for labs at 1130 then visit with Wilber Bihari, NP at 1215 for further eval.

## 2021-12-14 ENCOUNTER — Inpatient Hospital Stay (HOSPITAL_BASED_OUTPATIENT_CLINIC_OR_DEPARTMENT_OTHER): Payer: Medicare Other | Admitting: Adult Health

## 2021-12-14 ENCOUNTER — Other Ambulatory Visit: Payer: Self-pay

## 2021-12-14 ENCOUNTER — Inpatient Hospital Stay: Payer: Medicare Other

## 2021-12-14 ENCOUNTER — Encounter: Payer: Self-pay | Admitting: Adult Health

## 2021-12-14 VITALS — BP 158/66 | HR 87 | Temp 98.2°F | Resp 18 | Ht 64.0 in | Wt 220.5 lb

## 2021-12-14 DIAGNOSIS — Z170 Estrogen receptor positive status [ER+]: Secondary | ICD-10-CM | POA: Diagnosis not present

## 2021-12-14 DIAGNOSIS — Z7901 Long term (current) use of anticoagulants: Secondary | ICD-10-CM

## 2021-12-14 DIAGNOSIS — C7951 Secondary malignant neoplasm of bone: Secondary | ICD-10-CM | POA: Diagnosis not present

## 2021-12-14 DIAGNOSIS — R110 Nausea: Secondary | ICD-10-CM | POA: Diagnosis not present

## 2021-12-14 DIAGNOSIS — B49 Unspecified mycosis: Secondary | ICD-10-CM | POA: Diagnosis not present

## 2021-12-14 DIAGNOSIS — C50411 Malignant neoplasm of upper-outer quadrant of right female breast: Secondary | ICD-10-CM | POA: Diagnosis not present

## 2021-12-14 DIAGNOSIS — M8580 Other specified disorders of bone density and structure, unspecified site: Secondary | ICD-10-CM | POA: Diagnosis not present

## 2021-12-14 DIAGNOSIS — Z5111 Encounter for antineoplastic chemotherapy: Secondary | ICD-10-CM | POA: Diagnosis not present

## 2021-12-14 LAB — COMPREHENSIVE METABOLIC PANEL
ALT: 10 U/L (ref 0–44)
AST: 11 U/L — ABNORMAL LOW (ref 15–41)
Albumin: 3.9 g/dL (ref 3.5–5.0)
Alkaline Phosphatase: 106 U/L (ref 38–126)
Anion gap: 3 — ABNORMAL LOW (ref 5–15)
BUN: 19 mg/dL (ref 8–23)
CO2: 29 mmol/L (ref 22–32)
Calcium: 10.7 mg/dL — ABNORMAL HIGH (ref 8.9–10.3)
Chloride: 106 mmol/L (ref 98–111)
Creatinine, Ser: 0.91 mg/dL (ref 0.44–1.00)
GFR, Estimated: >60 mL/min (ref >60)
Glucose, Bld: 123 mg/dL — ABNORMAL HIGH (ref 70–99)
Potassium: 4.3 mmol/L (ref 3.5–5.1)
Sodium: 138 mmol/L (ref 135–145)
Total Bilirubin: 0.6 mg/dL (ref 0.3–1.2)
Total Protein: 6.8 g/dL (ref 6.5–8.1)

## 2021-12-14 LAB — CBC WITH DIFFERENTIAL (CANCER CENTER ONLY)
Abs Immature Granulocytes: 0.04 10*3/uL (ref 0.00–0.07)
Basophils Absolute: 0.1 10*3/uL (ref 0.0–0.1)
Basophils Relative: 1 %
Eosinophils Absolute: 0.2 10*3/uL (ref 0.0–0.5)
Eosinophils Relative: 2 %
HCT: 38.5 % (ref 36.0–46.0)
Hemoglobin: 12.5 g/dL (ref 12.0–15.0)
Immature Granulocytes: 0 %
Lymphocytes Relative: 11 %
Lymphs Abs: 1.2 10*3/uL (ref 0.7–4.0)
MCH: 28.9 pg (ref 26.0–34.0)
MCHC: 32.5 g/dL (ref 30.0–36.0)
MCV: 89.1 fL (ref 80.0–100.0)
Monocytes Absolute: 1.4 10*3/uL — ABNORMAL HIGH (ref 0.1–1.0)
Monocytes Relative: 13 %
Neutro Abs: 7.8 10*3/uL — ABNORMAL HIGH (ref 1.7–7.7)
Neutrophils Relative %: 73 %
Platelet Count: 231 10*3/uL (ref 150–400)
RBC: 4.32 MIL/uL (ref 3.87–5.11)
RDW: 15.9 % — ABNORMAL HIGH (ref 11.5–15.5)
WBC Count: 10.7 10*3/uL — ABNORMAL HIGH (ref 4.0–10.5)
nRBC: 0 % (ref 0.0–0.2)

## 2021-12-14 LAB — PROTIME-INR
INR: 1.4 — ABNORMAL HIGH (ref 0.8–1.2)
Prothrombin Time: 17 seconds — ABNORMAL HIGH (ref 11.4–15.2)

## 2021-12-14 MED ORDER — WARFARIN SODIUM 5 MG PO TABS: ORAL_TABLET | ORAL | 1 refills | Status: DC

## 2021-12-14 MED ORDER — DOXYCYCLINE HYCLATE 100 MG PO TABS: 100.0000 mg | ORAL_TABLET | Freq: Two times a day (BID) | ORAL | 0 refills | Status: DC

## 2021-12-14 MED ORDER — WARFARIN SODIUM 2.5 MG PO TABS: ORAL_TABLET | ORAL | 3 refills | Status: DC

## 2021-12-14 NOTE — Progress Notes (Signed)
[facility] Follow up:    Alicia Barrack, MD [address] [name] [address]   DIAGNOSIS:  Cancer Staging  Malignant neoplasm of upper-outer quadrant of right breast in female, estrogen receptor positive ([place]) Staging form: Breast, AJCC 7th Edition - Clinical: Stage IIA (T1, N1, cM0) - Unsigned Specimen type: Core Needle Biopsy Histopathologic type: 9931 Laterality: Right Staging comments: Staged at breast conference 07/24/13.  - Pathologic: Stage IV (M1) - Unsigned Specimen type: Core Needle Biopsy Histopathologic type: 9931 Laterality: Right   SUMMARY OF ONCOLOGIC HISTORY: Alicia Vasquez status post right breast upper outer quadrant lumpectomy and sentinel lymph node sampling 09/09/2013 for an mpT1c pN1a, stage IIA invasive ductal carcinoma, estrogen and progesterone receptor both 100% positive with strong staining intensity, MIB-1 of 17% and no HER-2 amplification   (1) additional surgery for margin clearance 09/16/2013 obtained negative margins   (2) Oncotype DX recurrence score of 4 predicts a risk of outside the breast recurrence within 10 years of 7% if the patient's only systemic therapy is tamoxifen for 5 years. It also predicts no benefit from chemotherapy   (3) adjuvant radiation completed 01/07/2014   (4) anastrozole started 02/27/2014 stopped within 2 weeks because of arm swelling.              (a) bone density April 2016 showed osteopenia, with a t-score of -1.6             (b) anastrozole resumed 12/17/2015             (c) Bone density 09/20/2016 showed a T score of -2.2   (5) history of left lower extremity DVT 11/23/2012, initially on rivaroxaban, which caused chest pain, switch to Coumadin July 2014             (b) coumadin dose increased to 7.5/10 mg alternating days as of 06/13/2017    METASTATIC DISEASE: August 2018 (6) status post left total hip replacement 01/16/2017 for estrogen receptor positive adenocarcinoma.              (a) CA 27-29 was 46.4 as of 04/18/2017             (b) chest CT scan 05/02/2017 shows no lung or liver lesion concern; it does show aortic atherosclerosis             (c) baseline bone scan 05/02/2017 was negative             (d) PET scan 09/12/2017 shows no active disease, including bone             (e) PET scan and CT chest on 05/29/2018 show no active disease             (f) chest CT and bone scan 03/18/2019 showed no evidence of active disease             (g) lumbar spine MRI 01/06/2021 shows multiple compression fractures but no obvious metastases; noncontrast cervical spine and head CT scans 01/04/2021 showed no evidence of neoplastic disease   (7) fulvestrant started 04/18/2017   (8) denosumab/Xgeva started 05/16/2017             (a) changed to every 12-weeks after 10/03/2017 dose              (b) held starting with 06/13/2018 dose due to dental concerns   (9) unprovoked deep vein thrombosis involving the left posterior tibial v documented 11/23/2012, resolved on repeat 06/27/2013 and not recurrent on multiple  Dopplers since, most recent 03/06/2018             (a) on chronic anticoagulation with warfarin given ongoing risks (stage IV breast cancer, relative immobility secondary to claudication  CURRENT THERAPY: Fulvestrant  INTERVAL HISTORY: Alicia Vasquez 81 y.o. female returns for urgent evaluation for an abdominal concern.  Several days ago she developed an area on her abdomen that was itching.  It has gotten worse and her friend and also retired Marine scientist convinced her to come in.  She has increasing pain, swelling and redness in the skin of her lower abdomen.  She denies fever or chills.  She says overnight it has worsened.  She denies any increased weakness or further issues related to it.     Patient Active Problem List   Diagnosis Date Noted   GERD (gastroesophageal reflux disease) 06/17/2021   Bilateral leg edema 06/03/2021   Pressure injury of left buttock, stage 1  06/03/2021   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   Weakness 04/27/2021   Estrogen receptor positive status (ER+) 05/31/2019   Major depressive disorder, single episode, unspecified 05/31/2019   Morbid (severe) obesity due to excess calories ([place]) 05/31/2019   Neoplasm related pain (acute) (chronic) 05/31/2019   Aortic atherosclerosis ([place]) 06/13/2017   Pain from bone metastases ([place]) 01/25/2017   Malignant neoplasm metastatic to bone ([place]) 01/25/2017   Endometrial hyperplasia 01/16/2017   Lytic bone lesion of left femur 01/15/2017   Hip fracture, pathological ([place]) 01/15/2017   Lymphedema 09/17/2015   Long term current use of anticoagulant therapy 08/23/2015   DVT, lower extremity ([place]) 06/18/2015   Bilateral knee pain 04/03/2015   Arm edema 08/28/2014   Vitamin D deficiency 04/23/2014   Hyperparathyroidism ([name]) 04/23/2014   Depression 07/18/2013   Malignant neoplasm of upper-outer quadrant of right breast in female, estrogen receptor positive ([place]) 07/15/2013   Overactive bladder 01/30/2013   Primary hypercoagulable state ([place]) 12/19/2012   HTN (hypertension) 09/27/2011   Hearing loss 09/27/2011   Seasonal allergies 09/27/2011   Hypothyroid 08/26/2011    is allergic to anesthetics, amide; benadryl [diphenhydramine hcl]; carbocaine [mepivacaine hcl]; codeine; epinephrine; sulfa antibiotics; diphenhydramine; latex; penicillins; and tramadol.  MEDICAL HISTORY: Past Medical History:  Diagnosis Date   Allergy    Anxiety    Arthritis    Blood transfusion without reported diagnosis    Breast cancer ([place]) 07/12/2013   Invasive Mammary Carcinoma   DVT (deep vein thrombosis) in pregnancy    Hypertension    Hypothyroid    Metastatic cancer to bone ([place]) dx'd 12/2016   hip   Personal history of radiation therapy    Pneumonia    PONV (postoperative nausea and vomiting)    Radiation 11/21/13-01/07/14   Right Breast/Supraclavicular    SURGICAL HISTORY: Past Surgical History:  Procedure  Laterality Date   BREAST LUMPECTOMY Left 2015   BREAST LUMPECTOMY WITH RADIOACTIVE SEED LOCALIZATION Right 09/09/2013   Procedure: BREAST LUMPECTOMY WITH RADIOACTIVE SEED LOCALIZATION WITH AXILLARY NODE EXCISION;  Surgeon: [name], MD;  Location: [facility];  Service: General;  Laterality: Right;   CYSTOSCOPY W/ URETERAL STENT PLACEMENT Right 01/07/2021   Procedure: CYSTOSCOPY WITH RETROGRADE PYELOGRAM/URETERAL STENT PLACEMENT;  Surgeon: Bjorn Loser, MD;  Location: [facility];  Service: Urology;  Laterality: Right;   CYSTOSCOPY/URETEROSCOPY/HOLMIUM LASER/STENT PLACEMENT Right 02/08/2021   Procedure: CYSTOSCOPY, RIGHT URETEROSCOPY, RIGHT RETRGRADE PYELOGRAM, HOLMIUM LASER/STENT PLACEMENT;  Surgeon: Lucas Mallow, MD;  Location: [facility];  Service: Urology;  Laterality: Right;   DENTAL SURGERY  04/19/2012   13 TEETH REMOVED   DILATION AND CURETTAGE OF UTERUS     IR RADIOLOGIST EVAL & MGMT  01/21/2021   ORIF PERIPROSTHETIC FRACTURE Left 01/31/2017   Procedure: REVISION and OPEN REDUCTION INTERNAL FIXATION (ORIF) PERIPROSTHETIC FRACTURE LEFT HIP;  Surgeon: Paralee Cancel, MD;  Location: [facility];  Service: Orthopedics;  Laterality: Left;  120 mins   RE-EXCISION OF BREAST LUMPECTOMY Right 09/24/2013   Procedure: RE-EXCISION OF RIGHT BREAST LUMPECTOMY;  Surgeon: [name], MD;  Location: [facility];  Service: General;  Laterality: Right;   TOTAL HIP ARTHROPLASTY Left 01/16/2017   Procedure: TOTAL HIP ARTHROPLASTY POSTERIOR;  Surgeon: Paralee Cancel, MD;  Location: [facility];  Service: Orthopedics;  Laterality: Left;    SOCIAL HISTORY: Social History   Socioeconomic History   Marital status: Widowed    Spouse name: Not on file   Number of children: 1   Years of education: Not on file   Highest education level: Not on file  Occupational History   Not on file  Tobacco Use   Smoking status: Never   Smokeless tobacco: Never  Vaping Use   Vaping Use: Never  used  Substance and Sexual Activity   Alcohol use: No    Alcohol/week: 0.0 standard drinks of alcohol   Drug use: No   Sexual activity: Never  Other Topics Concern   Not on file  Social History Narrative   Exercise: yard work.   Social Determinants of Health   Financial Resource Strain: Medium Risk (09/16/2021)   Overall Financial Resource Strain (CARDIA)    Difficulty of Paying Living Expenses: Somewhat hard  Food Insecurity: No Food Insecurity (09/13/2021)   Hunger Vital Sign    Worried About Running Out of Food in the Last Year: Never true    Ran Out of Food in the Last Year: Never true  Transportation Needs: No Transportation Needs (09/13/2021)   PRAPARE - Hydrologist (Medical): No    Lack of Transportation (Non-Medical): No  Physical Activity: Inactive (09/13/2021)   Exercise Vital Sign    Days of Exercise per Week: 0 days    Minutes of Exercise per Session: 0 min  Stress: No Stress Concern Present (09/13/2021)   [place]    Feeling of Stress : Only a little  Social Connections: Socially Isolated (09/13/2021)   Social Connection and Isolation Panel [NHANES]    Frequency of Communication with Friends and Family: More than three times a week    Frequency of Social Gatherings with Friends and Family: Three times a week    Attends Religious Services: Never    Active Member of Clubs or Organizations: No    Attends Archivist Meetings: Never    Marital Status: Widowed  Intimate Partner Violence: Not At Risk (09/13/2021)   Humiliation, Afraid, Rape, and Kick questionnaire    Fear of Current or Ex-Partner: No    Emotionally Abused: No    Physically Abused: No    Sexually Abused: No    FAMILY HISTORY: Family History  Problem Relation Age of Onset   Heart disease Brother    Colon cancer Brother    Prostate cancer Brother     Review of Systems - Oncology    PHYSICAL  EXAMINATION  ECOG PERFORMANCE STATUS: 1 - Symptomatic but completely ambulatory  Vitals:   12/14/21 1156  BP: (!) 158/66  Pulse: 87  Resp: 18  Temp: 98.2  F (36.8 C)  SpO2: 95%    Physical Exam Constitutional:      General: She is not in acute distress.    Appearance: Normal appearance. She is not toxic-appearing.  HENT:     Head: Normocephalic and atraumatic.  Eyes:     General: No scleral icterus. Cardiovascular:     Rate and Rhythm: Normal rate and regular rhythm.     Pulses: Normal pulses.     Heart sounds: Normal heart sounds.  Pulmonary:     Effort: Pulmonary effort is normal.     Breath sounds: Normal breath sounds.  Abdominal:     General: Abdomen is flat.     Palpations: Abdomen is soft.     Comments: See picture below   Musculoskeletal:        General: No swelling.     Cervical back: Neck supple.  Lymphadenopathy:     Cervical: No cervical adenopathy.  Skin:    General: Skin is warm and dry.     Findings: No rash.  Neurological:     General: No focal deficit present.     Mental Status: She is alert.  Psychiatric:        Mood and Affect: Mood normal.        Behavior: Behavior normal.   Abdomen abscess with surrounding cellulitis on 12/14/2021--Central area with minimal fluctuance suggesting central area is improving--not reflected in picture, but area around appears bruised     LABORATORY DATA:  CBC    Component Value Date/Time   WBC 10.7 (H) 12/14/2021 1125   WBC 7.2 12/07/2021 1243   RBC 4.32 12/14/2021 1125   HGB 12.5 12/14/2021 1125   HGB 11.6 05/16/2017 1417   HCT 38.5 12/14/2021 1125   HCT 36.7 05/16/2017 1417   PLT 231 12/14/2021 1125   PLT 323 05/16/2017 1417   MCV 89.1 12/14/2021 1125   MCV 80.8 05/16/2017 1417   MCH 28.9 12/14/2021 1125   MCHC 32.5 12/14/2021 1125   RDW 15.9 (H) 12/14/2021 1125   RDW 16.8 (H) 05/16/2017 1417   LYMPHSABS 1.2 12/14/2021 1125   LYMPHSABS 1.1 05/16/2017 1417   MONOABS 1.4 (H) 12/14/2021 1125    MONOABS 0.8 05/16/2017 1417   EOSABS 0.2 12/14/2021 1125   EOSABS 0.2 05/16/2017 1417   BASOSABS 0.1 12/14/2021 1125   BASOSABS 0.1 05/16/2017 1417    CMP     Component Value Date/Time   NA 138 12/14/2021 1125   NA 141 05/16/2017 1416   K 4.3 12/14/2021 1125   K 3.8 05/16/2017 1416   CL 106 12/14/2021 1125   CO2 29 12/14/2021 1125   CO2 27 05/16/2017 1416   GLUCOSE 123 (H) 12/14/2021 1125   GLUCOSE 140 05/16/2017 1416   BUN 19 12/14/2021 1125   BUN 13.2 05/16/2017 1416   CREATININE 0.91 12/14/2021 1125   CREATININE 0.95 06/14/2021 1049   CREATININE 0.80 09/22/2017 1555   CREATININE 0.8 05/16/2017 1416   CALCIUM 10.7 (H) 12/14/2021 1125   CALCIUM 11.0 (H) 05/16/2017 1416   PROT 6.8 12/14/2021 1125   PROT 6.9 05/16/2017 1416   ALBUMIN 3.9 12/14/2021 1125   ALBUMIN 3.6 05/16/2017 1416   AST 11 (L) 12/14/2021 1125   AST 10 (L) 06/14/2021 1049   AST 13 05/16/2017 1416   ALT 10 12/14/2021 1125   ALT 10 06/14/2021 1049   ALT <6 05/16/2017 1416   ALKPHOS 106 12/14/2021 1125   ALKPHOS 130 05/16/2017 1416   BILITOT 0.6 12/14/2021  1125   BILITOT 0.4 06/14/2021 1049   BILITOT 0.31 05/16/2017 1416   GFRNONAA >60 12/14/2021 1125   GFRNONAA >60 06/14/2021 1049   GFRNONAA 75 08/19/2015 1602   GFRAA >60 08/07/2019 1305   GFRAA >60 03/21/2018 1417   GFRAA 87 08/19/2015 1602     ASSESSMENT and THERAPY PLAN:   Malignant neoplasm metastatic to bone (HCC) Alicia Vasquez is an 81 year old with h/o metastatic breast cancer to the bone on fulvestrant every 4 weeks with no recent sign of breast cancer progression.    Her abdominal concern is consistent with cellulitis.  I placed her on Doxycycline BID.  Her WBC is elevated, however she is not experiencing any clinical signs of decline.  Her vitals are stable and her strength is at her baseline.  If her infection worsens, I recommended that she go to ER as she may need more urgent intervention/IV antibiotics.    Her INR is 1.4 today.  After  discussion between myself, Alicia Vasquez, and Alicia Vasquez, we came up with the following plan:  1. Alicia Vasquez will get a weekly pill organizer for Alicia Vasquez  2. Alicia Vasquez will organize Alicia Vasquez's pills weekly in the organizer.   3. We can call Alicia Vasquez about changes in Alicia Vasquez's Coumadin dose.    Alicia Vasquez will take Coumadin 7.5 mg daily.  I asked that Alicia Vasquez return Thursday or Friday for labs and f/u.     All questions were answered. The patient knows to call the clinic with any problems, questions or concerns. We can certainly see the patient much sooner if necessary.  Total encounter time:30 minutes*in face-to-face visit time, chart review, lab review, care coordination, order entry, and documentation of the encounter time.    Wilber Bihari, NP 12/14/21 3:19 PM Medical Oncology and Hematology [facility] [place], [place] [address] Tel. [phone]    Fax. [phone]  *Total Encounter Time as defined by the Centers for Medicare and Medicaid Services includes, in addition to the face-to-face time of a patient visit (documented in the note above) non-face-to-face time: obtaining and reviewing outside history, ordering and reviewing medications, tests or procedures, care coordination (communications with other health care professionals or caregivers) and documentation in the medical record.

## 2021-12-14 NOTE — Assessment & Plan Note (Signed)
Alicia Vasquez is an 81 year old with h/o metastatic breast cancer to the bone on fulvestrant every 4 weeks with no recent sign of breast cancer progression.    Her abdominal concern is consistent with cellulitis.  I placed her on Doxycycline BID.  Her WBC is elevated, however she is not experiencing any clinical signs of decline.  Her vitals are stable and her strength is at her baseline.  If her infection worsens, I recommended that she go to ER as she may need more urgent intervention/IV antibiotics.    Her INR is 1.4 today.  After discussion between myself, Alicia Vasquez, and Alicia Vasquez, we came up with the following plan:  1. Alicia Vasquez will get a weekly pill organizer for Alicia Vasquez  2. Alicia Vasquez will organize Alicia Vasquez's pills weekly in the organizer.   3. We can call Alicia Vasquez about changes in Alicia Vasquez's Coumadin dose.    Alicia Vasquez will take Coumadin 7.5 mg daily.  I asked that Alicia Vasquez return Thursday or Friday for labs and f/u.

## 2021-12-14 NOTE — Patient Instructions (Signed)

## 2021-12-16 ENCOUNTER — Inpatient Hospital Stay: Payer: Medicare Other

## 2021-12-16 ENCOUNTER — Other Ambulatory Visit: Payer: Self-pay

## 2021-12-16 ENCOUNTER — Inpatient Hospital Stay (HOSPITAL_BASED_OUTPATIENT_CLINIC_OR_DEPARTMENT_OTHER): Payer: Medicare Other | Admitting: Adult Health

## 2021-12-16 VITALS — BP 142/85 | HR 87 | Temp 99.0°F | Resp 18 | Ht 64.0 in | Wt 220.0 lb

## 2021-12-16 DIAGNOSIS — Z5111 Encounter for antineoplastic chemotherapy: Secondary | ICD-10-CM | POA: Diagnosis not present

## 2021-12-16 DIAGNOSIS — R110 Nausea: Secondary | ICD-10-CM | POA: Diagnosis not present

## 2021-12-16 DIAGNOSIS — C7951 Secondary malignant neoplasm of bone: Secondary | ICD-10-CM

## 2021-12-16 DIAGNOSIS — B49 Unspecified mycosis: Secondary | ICD-10-CM | POA: Diagnosis not present

## 2021-12-16 DIAGNOSIS — Z170 Estrogen receptor positive status [ER+]: Secondary | ICD-10-CM

## 2021-12-16 DIAGNOSIS — C50411 Malignant neoplasm of upper-outer quadrant of right female breast: Secondary | ICD-10-CM | POA: Diagnosis not present

## 2021-12-16 DIAGNOSIS — M8580 Other specified disorders of bone density and structure, unspecified site: Secondary | ICD-10-CM | POA: Diagnosis not present

## 2021-12-16 DIAGNOSIS — T1490XA Injury, unspecified, initial encounter: Secondary | ICD-10-CM

## 2021-12-16 LAB — CBC WITH DIFFERENTIAL/PLATELET
Abs Immature Granulocytes: 0.03 10*3/uL (ref 0.00–0.07)
Basophils Absolute: 0.1 10*3/uL (ref 0.0–0.1)
Basophils Relative: 1 %
Eosinophils Absolute: 0.3 10*3/uL (ref 0.0–0.5)
Eosinophils Relative: 3 %
HCT: 39.1 % (ref 36.0–46.0)
Hemoglobin: 12.5 g/dL (ref 12.0–15.0)
Immature Granulocytes: 0 %
Lymphocytes Relative: 13 %
Lymphs Abs: 1.3 10*3/uL (ref 0.7–4.0)
MCH: 28.7 pg (ref 26.0–34.0)
MCHC: 32 g/dL (ref 30.0–36.0)
MCV: 89.7 fL (ref 80.0–100.0)
Monocytes Absolute: 1.4 10*3/uL — ABNORMAL HIGH (ref 0.1–1.0)
Monocytes Relative: 15 %
Neutro Abs: 6.4 10*3/uL (ref 1.7–7.7)
Neutrophils Relative %: 68 %
Platelets: 254 10*3/uL (ref 150–400)
RBC: 4.36 MIL/uL (ref 3.87–5.11)
RDW: 15.5 % (ref 11.5–15.5)
WBC: 9.5 10*3/uL (ref 4.0–10.5)
nRBC: 0 % (ref 0.0–0.2)

## 2021-12-16 LAB — PROTIME-INR
INR: 1.4 — ABNORMAL HIGH (ref 0.8–1.2)
Prothrombin Time: 17.2 seconds — ABNORMAL HIGH (ref 11.4–15.2)

## 2021-12-16 NOTE — Assessment & Plan Note (Signed)
Ione has a history of metastatic breast cancer on treatment with Faslodex every month.  She was seen earlier this week in follow-up and had an abdominal cellulitis.  She was placed on doxycycline.  Her white blood cells have improved along with her neutrophil count.  She has no signs of systemic infection.  She will continue on doxycycline and since the area seems more fluctuant we will send her to surgery for an appointment this week to evaluate for incision and drainage.  Sheresa will return in 1 week for lab only and then in 2 weeks for her regular follow-up.

## 2021-12-16 NOTE — Progress Notes (Signed)
[facility] Follow up:    Alicia Barrack, MD [address] [name] [address]   DIAGNOSIS:  Cancer Staging  Malignant neoplasm of upper-outer quadrant of right breast in female, estrogen receptor positive ([place]) Staging form: Breast, AJCC 7th Edition - Clinical: Stage IIA (T1, N1, cM0) - Unsigned Specimen type: Core Needle Biopsy Histopathologic type: 9931 Laterality: Right Staging comments: Staged at breast conference 07/24/13.  - Pathologic: Stage IV (M1) - Unsigned Specimen type: Core Needle Biopsy Histopathologic type: 9931 Laterality: Right   SUMMARY OF ONCOLOGIC HISTORY: Alicia Vasquez woman status post right breast upper outer quadrant lumpectomy and sentinel lymph node sampling 09/09/2013 for an mpT1c pN1a, stage IIA invasive ductal carcinoma, estrogen and progesterone receptor both 100% positive with strong staining intensity, MIB-1 of 17% and no HER-2 amplification   (1) additional surgery for margin clearance 09/16/2013 obtained negative margins   (2) Oncotype DX recurrence score of 4 predicts a risk of outside the breast recurrence within 10 years of 7% if the patient's only systemic therapy is tamoxifen for 5 years. It also predicts no benefit from chemotherapy   (3) adjuvant radiation completed 01/07/2014   (4) anastrozole started 02/27/2014 stopped within 2 weeks because of arm swelling.              (a) bone density April 2016 showed osteopenia, with a t-score of -1.6             (b) anastrozole resumed 12/17/2015             (c) Bone density 09/20/2016 showed a T score of -2.2   (5) history of left lower extremity DVT 11/23/2012, initially on rivaroxaban, which caused chest pain, switch to Coumadin July 2014             (b) coumadin dose increased to 7.5/10 mg alternating days as of 06/13/2017    METASTATIC DISEASE: August 2018 (6) status post left total hip replacement 01/16/2017 for estrogen receptor positive adenocarcinoma.              (a) CA 27-29 was 46.4 as of 04/18/2017             (b) chest CT scan 05/02/2017 shows no lung or liver lesion concern; it does show aortic atherosclerosis             (c) baseline bone scan 05/02/2017 was negative             (d) PET scan 09/12/2017 shows no active disease, including bone             (e) PET scan and CT chest on 05/29/2018 show no active disease             (f) chest CT and bone scan 03/18/2019 showed no evidence of active disease             (g) lumbar spine MRI 01/06/2021 shows multiple compression fractures but no obvious metastases; noncontrast cervical spine and head CT scans 01/04/2021 showed no evidence of neoplastic disease   (7) fulvestrant started 04/18/2017   (8) denosumab/Xgeva started 05/16/2017             (a) changed to every 12-weeks after 10/03/2017 dose              (b) held starting with 06/13/2018 dose due to dental concerns   (9) unprovoked deep vein thrombosis involving the left posterior tibial v documented 11/23/2012, resolved on repeat 06/27/2013 and not recurrent on multiple  Dopplers since, most recent 03/06/2018             (a) on chronic anticoagulation with warfarin given ongoing risks (stage IV breast cancer, relative immobility secondary to claudication  CURRENT THERAPY: Faslodex  INTERVAL HISTORY: Alicia Vasquez 81 y.o. female returns for follow-up of her abdominal cellulitis.  Since her visit 2 days ago she has been taking doxycycline.  She notes that the area is using a little bit more by it the overall span of the redness appears to be decreasing in size.  She has no way to check her temperature does not feel like she has had any fevers or chills or chills but does feel cold a lot because her son keeps the temperature at 69 degrees in their home.  She is taking Coumadin daily.  Her INR today is 1.4.   Patient Active Problem List   Diagnosis Date Noted   GERD (gastroesophageal reflux disease) 06/17/2021   Bilateral leg edema 06/03/2021    Pressure injury of left buttock, stage 1 06/03/2021   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   Weakness 04/27/2021   Estrogen receptor positive status (ER+) 05/31/2019   Major depressive disorder, single episode, unspecified 05/31/2019   Morbid (severe) obesity due to excess calories ([employer]) 05/31/2019   Neoplasm related pain (acute) (chronic) 05/31/2019   Aortic atherosclerosis ([place]) 06/13/2017   Pain from bone metastases ([place]) 01/25/2017   Malignant neoplasm metastatic to bone ([place]) 01/25/2017   Endometrial hyperplasia 01/16/2017   Lytic bone lesion of left femur 01/15/2017   Hip fracture, pathological ([place]) 01/15/2017   Lymphedema 09/17/2015   Long term current use of anticoagulant therapy 08/23/2015   DVT, lower extremity ([place]) 06/18/2015   Bilateral knee pain 04/03/2015   Arm edema 08/28/2014   Vitamin D deficiency 04/23/2014   Hyperparathyroidism ([place]) 04/23/2014   Depression 07/18/2013   Malignant neoplasm of upper-outer quadrant of right breast in female, estrogen receptor positive ([place]) 07/15/2013   Overactive bladder 01/30/2013   Primary hypercoagulable state ([place]) 12/19/2012   HTN (hypertension) 09/27/2011   Hearing loss 09/27/2011   Seasonal allergies 09/27/2011   Hypothyroid 08/26/2011    is allergic to anesthetics, amide; benadryl [diphenhydramine hcl]; carbocaine [mepivacaine hcl]; codeine; epinephrine; sulfa antibiotics; diphenhydramine; latex; penicillins; and tramadol.  MEDICAL HISTORY: Past Medical History:  Diagnosis Date   Allergy    Anxiety    Arthritis    Blood transfusion without reported diagnosis    Breast cancer ([place]) 07/12/2013   Invasive Mammary Carcinoma   DVT (deep vein thrombosis) in pregnancy    Hypertension    Hypothyroid    Metastatic cancer to bone ([place]) dx'd 12/2016   hip   Personal history of radiation therapy    Pneumonia    PONV (postoperative nausea and vomiting)    Radiation 11/21/13-01/07/14   Right Breast/Supraclavicular    SURGICAL  HISTORY: Past Surgical History:  Procedure Laterality Date   BREAST LUMPECTOMY Left 2015   BREAST LUMPECTOMY WITH RADIOACTIVE SEED LOCALIZATION Right 09/09/2013   Procedure: BREAST LUMPECTOMY WITH RADIOACTIVE SEED LOCALIZATION WITH AXILLARY NODE EXCISION;  Surgeon: [name], MD;  Location: [facility];  Service: General;  Laterality: Right;   CYSTOSCOPY W/ URETERAL STENT PLACEMENT Right 01/07/2021   Procedure: CYSTOSCOPY WITH RETROGRADE PYELOGRAM/URETERAL STENT PLACEMENT;  Surgeon: Bjorn Loser, MD;  Location: [facility];  Service: Urology;  Laterality: Right;   CYSTOSCOPY/URETEROSCOPY/HOLMIUM LASER/STENT PLACEMENT Right 02/08/2021   Procedure: CYSTOSCOPY, RIGHT URETEROSCOPY, RIGHT RETRGRADE PYELOGRAM, HOLMIUM LASER/STENT PLACEMENT;  Surgeon: Marton Redwood III,  MD;  Location: [facility];  Service: Urology;  Laterality: Right;   DENTAL SURGERY  04/19/2012   13 TEETH REMOVED   DILATION AND CURETTAGE OF UTERUS     IR RADIOLOGIST EVAL & MGMT  01/21/2021   ORIF PERIPROSTHETIC FRACTURE Left 01/31/2017   Procedure: REVISION and OPEN REDUCTION INTERNAL FIXATION (ORIF) PERIPROSTHETIC FRACTURE LEFT HIP;  Surgeon: Paralee Cancel, MD;  Location: [facility];  Service: Orthopedics;  Laterality: Left;  120 mins   RE-EXCISION OF BREAST LUMPECTOMY Right 09/24/2013   Procedure: RE-EXCISION OF RIGHT BREAST LUMPECTOMY;  Surgeon: [name], MD;  Location: [facility];  Service: General;  Laterality: Right;   TOTAL HIP ARTHROPLASTY Left 01/16/2017   Procedure: TOTAL HIP ARTHROPLASTY POSTERIOR;  Surgeon: Paralee Cancel, MD;  Location: [facility];  Service: Orthopedics;  Laterality: Left;    SOCIAL HISTORY: Social History   Socioeconomic History   Marital status: Widowed    Spouse name: Not on file   Number of children: 1   Years of education: Not on file   Highest education level: Not on file  Occupational History   Not on file  Tobacco Use   Smoking status: Never   Smokeless  tobacco: Never  Vaping Use   Vaping Use: Never used  Substance and Sexual Activity   Alcohol use: No    Alcohol/week: 0.0 standard drinks of alcohol   Drug use: No   Sexual activity: Never  Other Topics Concern   Not on file  Social History Narrative   Exercise: yard work.   Social Determinants of Health   Financial Resource Strain: Medium Risk (09/16/2021)   Overall Financial Resource Strain (CARDIA)    Difficulty of Paying Living Expenses: Somewhat hard  Food Insecurity: No Food Insecurity (09/13/2021)   Hunger Vital Sign    Worried About Running Out of Food in the Last Year: Never true    Ran Out of Food in the Last Year: Never true  Transportation Needs: No Transportation Needs (09/13/2021)   PRAPARE - Hydrologist (Medical): No    Lack of Transportation (Non-Medical): No  Physical Activity: Inactive (09/13/2021)   Exercise Vital Sign    Days of Exercise per Week: 0 days    Minutes of Exercise per Session: 0 min  Stress: No Stress Concern Present (09/13/2021)   [place]    Feeling of Stress : Only a little  Social Connections: Socially Isolated (09/13/2021)   Social Connection and Isolation Panel [NHANES]    Frequency of Communication with Friends and Family: More than three times a week    Frequency of Social Gatherings with Friends and Family: Three times a week    Attends Religious Services: Never    Active Member of Clubs or Organizations: No    Attends Archivist Meetings: Never    Marital Status: Widowed  Intimate Partner Violence: Not At Risk (09/13/2021)   Humiliation, Afraid, Rape, and Kick questionnaire    Fear of Current or Ex-Partner: No    Emotionally Abused: No    Physically Abused: No    Sexually Abused: No    FAMILY HISTORY: Family History  Problem Relation Age of Onset   Heart disease Brother    Colon cancer Brother    Prostate cancer Brother      Review of Systems  Constitutional:  Negative for appetite change, chills, fatigue, fever and unexpected weight change.  HENT:   Negative for  hearing loss, lump/mass and trouble swallowing.   Eyes:  Negative for eye problems and icterus.  Respiratory:  Negative for chest tightness, cough and shortness of breath.   Cardiovascular:  Negative for chest pain, leg swelling and palpitations.  Gastrointestinal:  Negative for abdominal distention, abdominal pain, constipation, diarrhea, nausea and vomiting.  Endocrine: Negative for hot flashes.  Genitourinary:  Negative for difficulty urinating.   Musculoskeletal:  Negative for arthralgias.  Skin:  Positive for wound. Negative for itching and rash.  Neurological:  Negative for dizziness, extremity weakness, headaches and numbness.  Hematological:  Negative for adenopathy. Does not bruise/bleed easily.  Psychiatric/Behavioral:  Negative for depression. The patient is not nervous/anxious.       PHYSICAL EXAMINATION  ECOG PERFORMANCE STATUS: 1 - Symptomatic but completely ambulatory  Vitals:   12/16/21 1148  BP: (!) 142/85  Pulse: 87  Resp: 18  Temp: 99 F (37.2 C)  SpO2: 98%    Physical Exam Constitutional:      General: She is not in acute distress.    Appearance: Normal appearance. She is not toxic-appearing.  HENT:     Head: Normocephalic and atraumatic.  Eyes:     General: No scleral icterus. Cardiovascular:     Rate and Rhythm: Normal rate and regular rhythm.     Pulses: Normal pulses.     Heart sounds: Normal heart sounds.  Pulmonary:     Effort: Pulmonary effort is normal.     Breath sounds: Normal breath sounds.  Abdominal:     General: Abdomen is flat. Bowel sounds are normal. There is no distension.     Palpations: Abdomen is soft.     Tenderness: There is no abdominal tenderness.  Musculoskeletal:        General: No swelling.     Cervical back: Neck supple.  Lymphadenopathy:     Cervical: No cervical  adenopathy.  Skin:    General: Skin is warm and dry.     Findings: No rash.     Comments: The area of redness on her abdomen is improving however the centralized fluctuance does seem to be increasing.  Neurological:     General: No focal deficit present.     Mental Status: She is alert.  Psychiatric:        Mood and Affect: Mood normal.        Behavior: Behavior normal.     LABORATORY DATA:  CBC    Component Value Date/Time   WBC 9.5 12/16/2021 1116   RBC 4.36 12/16/2021 1116   HGB 12.5 12/16/2021 1116   HGB 12.5 12/14/2021 1125   HGB 11.6 05/16/2017 1417   HCT 39.1 12/16/2021 1116   HCT 36.7 05/16/2017 1417   PLT 254 12/16/2021 1116   PLT 231 12/14/2021 1125   PLT 323 05/16/2017 1417   MCV 89.7 12/16/2021 1116   MCV 80.8 05/16/2017 1417   MCH 28.7 12/16/2021 1116   MCHC 32.0 12/16/2021 1116   RDW 15.5 12/16/2021 1116   RDW 16.8 (H) 05/16/2017 1417   LYMPHSABS 1.3 12/16/2021 1116   LYMPHSABS 1.1 05/16/2017 1417   MONOABS 1.4 (H) 12/16/2021 1116   MONOABS 0.8 05/16/2017 1417   EOSABS 0.3 12/16/2021 1116   EOSABS 0.2 05/16/2017 1417   BASOSABS 0.1 12/16/2021 1116   BASOSABS 0.1 05/16/2017 1417    CMP     Component Value Date/Time   NA 138 12/14/2021 1125   NA 141 05/16/2017 1416   K 4.3 12/14/2021 1125  K 3.8 05/16/2017 1416   CL 106 12/14/2021 1125   CO2 29 12/14/2021 1125   CO2 27 05/16/2017 1416   GLUCOSE 123 (H) 12/14/2021 1125   GLUCOSE 140 05/16/2017 1416   BUN 19 12/14/2021 1125   BUN 13.2 05/16/2017 1416   CREATININE 0.91 12/14/2021 1125   CREATININE 0.95 06/14/2021 1049   CREATININE 0.80 09/22/2017 1555   CREATININE 0.8 05/16/2017 1416   CALCIUM 10.7 (H) 12/14/2021 1125   CALCIUM 11.0 (H) 05/16/2017 1416   PROT 6.8 12/14/2021 1125   PROT 6.9 05/16/2017 1416   ALBUMIN 3.9 12/14/2021 1125   ALBUMIN 3.6 05/16/2017 1416   AST 11 (L) 12/14/2021 1125   AST 10 (L) 06/14/2021 1049   AST 13 05/16/2017 1416   ALT 10 12/14/2021 1125   ALT 10  06/14/2021 1049   ALT <6 05/16/2017 1416   ALKPHOS 106 12/14/2021 1125   ALKPHOS 130 05/16/2017 1416   BILITOT 0.6 12/14/2021 1125   BILITOT 0.4 06/14/2021 1049   BILITOT 0.31 05/16/2017 1416   GFRNONAA >60 12/14/2021 1125   GFRNONAA >60 06/14/2021 1049   GFRNONAA 75 08/19/2015 1602   GFRAA >60 08/07/2019 1305   GFRAA >60 03/21/2018 1417   GFRAA 87 08/19/2015 1602        ASSESSMENT and THERAPY PLAN:   Malignant neoplasm of upper-outer quadrant of right breast in female, estrogen receptor positive ([place]) [place] has a history of metastatic breast cancer on treatment with Faslodex every month.  She was seen earlier this week in follow-up and had an abdominal cellulitis.  She was placed on doxycycline.  Her white blood cells have improved along with her neutrophil count.  She has no signs of systemic infection.  She will continue on doxycycline and since the area seems more fluctuant we will send her to surgery for an appointment this week to evaluate for incision and drainage.  Guillermina will return in 1 week for lab only and then in 2 weeks for her regular follow-up.   All questions were answered. The patient knows to call the clinic with any problems, questions or concerns. We can certainly see the patient much sooner if necessary.  Total encounter time:30 minutes*in face-to-face visit time, chart review, lab review, care coordination, order entry, and documentation of the encounter time.    Wilber Bihari, NP 12/16/21 12:40 PM Medical Oncology and Hematology [facility] [place], [place] [address] Tel. [phone]    Fax. [phone]  *Total Encounter Time as defined by the Centers for Medicare and Medicaid Services includes, in addition to the face-to-face time of a patient visit (documented in the note above) non-face-to-face time: obtaining and reviewing outside history, ordering and reviewing medications, tests or procedures, care coordination  (communications with other health care professionals or caregivers) and documentation in the medical record.

## 2021-12-17 DIAGNOSIS — L02211 Cutaneous abscess of abdominal wall: Secondary | ICD-10-CM | POA: Diagnosis not present

## 2021-12-23 ENCOUNTER — Inpatient Hospital Stay: Payer: Medicare Other

## 2021-12-23 ENCOUNTER — Other Ambulatory Visit: Payer: Self-pay | Admitting: *Deleted

## 2021-12-23 DIAGNOSIS — E038 Other specified hypothyroidism: Secondary | ICD-10-CM

## 2021-12-23 DIAGNOSIS — B49 Unspecified mycosis: Secondary | ICD-10-CM | POA: Diagnosis not present

## 2021-12-23 DIAGNOSIS — C7951 Secondary malignant neoplasm of bone: Secondary | ICD-10-CM | POA: Diagnosis not present

## 2021-12-23 DIAGNOSIS — Z5111 Encounter for antineoplastic chemotherapy: Secondary | ICD-10-CM | POA: Diagnosis not present

## 2021-12-23 DIAGNOSIS — M8580 Other specified disorders of bone density and structure, unspecified site: Secondary | ICD-10-CM | POA: Diagnosis not present

## 2021-12-23 DIAGNOSIS — C50411 Malignant neoplasm of upper-outer quadrant of right female breast: Secondary | ICD-10-CM | POA: Diagnosis not present

## 2021-12-23 DIAGNOSIS — Z170 Estrogen receptor positive status [ER+]: Secondary | ICD-10-CM

## 2021-12-23 DIAGNOSIS — R110 Nausea: Secondary | ICD-10-CM | POA: Diagnosis not present

## 2021-12-23 LAB — CBC WITH DIFFERENTIAL/PLATELET
Abs Immature Granulocytes: 0.04 10*3/uL (ref 0.00–0.07)
Basophils Absolute: 0.1 10*3/uL (ref 0.0–0.1)
Basophils Relative: 1 %
Eosinophils Absolute: 0.3 10*3/uL (ref 0.0–0.5)
Eosinophils Relative: 5 %
HCT: 39.5 % (ref 36.0–46.0)
Hemoglobin: 12.7 g/dL (ref 12.0–15.0)
Immature Granulocytes: 1 %
Lymphocytes Relative: 24 %
Lymphs Abs: 1.6 10*3/uL (ref 0.7–4.0)
MCH: 28.4 pg (ref 26.0–34.0)
MCHC: 32.2 g/dL (ref 30.0–36.0)
MCV: 88.4 fL (ref 80.0–100.0)
Monocytes Absolute: 0.8 10*3/uL (ref 0.1–1.0)
Monocytes Relative: 12 %
Neutro Abs: 4 10*3/uL (ref 1.7–7.7)
Neutrophils Relative %: 57 %
Platelets: 276 10*3/uL (ref 150–400)
RBC: 4.47 MIL/uL (ref 3.87–5.11)
RDW: 15.5 % (ref 11.5–15.5)
WBC: 6.8 10*3/uL (ref 4.0–10.5)
nRBC: 0 % (ref 0.0–0.2)

## 2021-12-23 LAB — COMPREHENSIVE METABOLIC PANEL
ALT: 11 U/L (ref 0–44)
AST: 13 U/L — ABNORMAL LOW (ref 15–41)
Albumin: 3.7 g/dL (ref 3.5–5.0)
Alkaline Phosphatase: 125 U/L (ref 38–126)
Anion gap: 4 — ABNORMAL LOW (ref 5–15)
BUN: 20 mg/dL (ref 8–23)
CO2: 31 mmol/L (ref 22–32)
Calcium: 10.8 mg/dL — ABNORMAL HIGH (ref 8.9–10.3)
Chloride: 106 mmol/L (ref 98–111)
Creatinine, Ser: 0.95 mg/dL (ref 0.44–1.00)
GFR, Estimated: 60 mL/min — ABNORMAL LOW (ref >60)
Glucose, Bld: 119 mg/dL — ABNORMAL HIGH (ref 70–99)
Potassium: 4 mmol/L (ref 3.5–5.1)
Sodium: 141 mmol/L (ref 135–145)
Total Bilirubin: 0.4 mg/dL (ref 0.3–1.2)
Total Protein: 6.8 g/dL (ref 6.5–8.1)

## 2021-12-23 LAB — PROTIME-INR
INR: 2.6 — ABNORMAL HIGH (ref 0.8–1.2)
Prothrombin Time: 27.2 seconds — ABNORMAL HIGH (ref 11.4–15.2)

## 2021-12-24 ENCOUNTER — Inpatient Hospital Stay: Payer: Medicare Other

## 2021-12-31 ENCOUNTER — Encounter: Payer: Self-pay | Admitting: Family Medicine

## 2022-01-03 ENCOUNTER — Other Ambulatory Visit: Payer: Self-pay | Admitting: Adult Health

## 2022-01-03 ENCOUNTER — Telehealth: Payer: Self-pay | Admitting: *Deleted

## 2022-01-03 DIAGNOSIS — Z170 Estrogen receptor positive status [ER+]: Secondary | ICD-10-CM

## 2022-01-03 DIAGNOSIS — C7951 Secondary malignant neoplasm of bone: Secondary | ICD-10-CM

## 2022-01-03 MED ORDER — HYDROCODONE-ACETAMINOPHEN 5-325 MG PO TABS: 1.0000 | ORAL_TABLET | Freq: Four times a day (QID) | ORAL | 0 refills | Status: DC | PRN

## 2022-01-03 NOTE — Progress Notes (Signed)
PMP aware reviewed refilling Vicodin as per her cancer pain management plan.  No changes in medication, no red flags.  Wilber Bihari, NP 01/03/22 2:12 PM Medical Oncology and Hematology [facility] [place], [place] [address] Tel. [phone]    Fax. [phone]

## 2022-01-03 NOTE — Telephone Encounter (Signed)
Patient called to verify dose of Warfarin. Patient states she is taking 7.5 mg a day:2.5 mg and 5 mg. She said that was her last directions when she was in the office.   Patient saw Ms. Delice Bison, NP on 12/14/21. The daily dose of warfarin is 7.5 mg in OV note.  Confirmed dose verbally with patient.  Patient also requested a refill of hydrocodone-acetaminophen.   Reminded patient she has appt here 01/05/22 for labs and to see Dr. Chryl Heck. Patient said she has that written down.    Asked patient if [name] (friend) should be contacted to verify warfarin dose and appt as [name] is helping her with appts and medications. She said [name] is at her own doctor appt right now and not to call her.   Refill request for pain med given to L. Delice Bison, NP.  Hydrocodone-acetaminophen refilled today. Patient informed.

## 2022-01-05 ENCOUNTER — Inpatient Hospital Stay: Payer: Medicare Other

## 2022-01-05 ENCOUNTER — Inpatient Hospital Stay: Payer: Medicare Other | Attending: Oncology | Admitting: Hematology and Oncology

## 2022-01-05 ENCOUNTER — Other Ambulatory Visit: Payer: Self-pay

## 2022-01-05 VITALS — BP 181/71 | HR 84 | Temp 98.1°F | Resp 16 | Ht 64.0 in | Wt 223.0 lb

## 2022-01-05 DIAGNOSIS — Z5111 Encounter for antineoplastic chemotherapy: Secondary | ICD-10-CM | POA: Diagnosis not present

## 2022-01-05 DIAGNOSIS — Z7901 Long term (current) use of anticoagulants: Secondary | ICD-10-CM | POA: Insufficient documentation

## 2022-01-05 DIAGNOSIS — Z86718 Personal history of other venous thrombosis and embolism: Secondary | ICD-10-CM | POA: Diagnosis not present

## 2022-01-05 DIAGNOSIS — I10 Essential (primary) hypertension: Secondary | ICD-10-CM | POA: Diagnosis not present

## 2022-01-05 DIAGNOSIS — C50411 Malignant neoplasm of upper-outer quadrant of right female breast: Secondary | ICD-10-CM | POA: Insufficient documentation

## 2022-01-05 DIAGNOSIS — Z923 Personal history of irradiation: Secondary | ICD-10-CM | POA: Insufficient documentation

## 2022-01-05 DIAGNOSIS — Z170 Estrogen receptor positive status [ER+]: Secondary | ICD-10-CM | POA: Insufficient documentation

## 2022-01-05 DIAGNOSIS — T1490XA Injury, unspecified, initial encounter: Secondary | ICD-10-CM

## 2022-01-05 DIAGNOSIS — N3281 Overactive bladder: Secondary | ICD-10-CM | POA: Insufficient documentation

## 2022-01-05 DIAGNOSIS — M8580 Other specified disorders of bone density and structure, unspecified site: Secondary | ICD-10-CM | POA: Insufficient documentation

## 2022-01-05 DIAGNOSIS — M899 Disorder of bone, unspecified: Secondary | ICD-10-CM

## 2022-01-05 DIAGNOSIS — M84659P Pathological fracture in other disease, hip, unspecified, subsequent encounter for fracture with malunion: Secondary | ICD-10-CM

## 2022-01-05 DIAGNOSIS — C7951 Secondary malignant neoplasm of bone: Secondary | ICD-10-CM | POA: Diagnosis not present

## 2022-01-05 DIAGNOSIS — R109 Unspecified abdominal pain: Secondary | ICD-10-CM | POA: Insufficient documentation

## 2022-01-05 LAB — COMPREHENSIVE METABOLIC PANEL
ALT: 16 U/L (ref 0–44)
AST: 17 U/L (ref 15–41)
Albumin: 3.7 g/dL (ref 3.5–5.0)
Alkaline Phosphatase: 139 U/L — ABNORMAL HIGH (ref 38–126)
Anion gap: 5 (ref 5–15)
BUN: 20 mg/dL (ref 8–23)
CO2: 25 mmol/L (ref 22–32)
Calcium: 10.4 mg/dL — ABNORMAL HIGH (ref 8.9–10.3)
Chloride: 108 mmol/L (ref 98–111)
Creatinine, Ser: 1.14 mg/dL — ABNORMAL HIGH (ref 0.44–1.00)
GFR, Estimated: 48 mL/min — ABNORMAL LOW (ref >60)
Glucose, Bld: 130 mg/dL — ABNORMAL HIGH (ref 70–99)
Potassium: 3.9 mmol/L (ref 3.5–5.1)
Sodium: 138 mmol/L (ref 135–145)
Total Bilirubin: 0.5 mg/dL (ref 0.3–1.2)
Total Protein: 6.9 g/dL (ref 6.5–8.1)

## 2022-01-05 LAB — CBC WITH DIFFERENTIAL/PLATELET
Abs Immature Granulocytes: 0.03 10*3/uL (ref 0.00–0.07)
Basophils Absolute: 0.1 10*3/uL (ref 0.0–0.1)
Basophils Relative: 1 %
Eosinophils Absolute: 0.3 10*3/uL (ref 0.0–0.5)
Eosinophils Relative: 3 %
HCT: 38.4 % (ref 36.0–46.0)
Hemoglobin: 12.4 g/dL (ref 12.0–15.0)
Immature Granulocytes: 0 %
Lymphocytes Relative: 17 %
Lymphs Abs: 1.5 10*3/uL (ref 0.7–4.0)
MCH: 28.1 pg (ref 26.0–34.0)
MCHC: 32.3 g/dL (ref 30.0–36.0)
MCV: 87.1 fL (ref 80.0–100.0)
Monocytes Absolute: 1 10*3/uL (ref 0.1–1.0)
Monocytes Relative: 12 %
Neutro Abs: 6 10*3/uL (ref 1.7–7.7)
Neutrophils Relative %: 67 %
Platelets: 255 10*3/uL (ref 150–400)
RBC: 4.41 MIL/uL (ref 3.87–5.11)
RDW: 15.5 % (ref 11.5–15.5)
WBC: 8.8 10*3/uL (ref 4.0–10.5)
nRBC: 0 % (ref 0.0–0.2)

## 2022-01-05 LAB — PROTIME-INR
INR: 2.7 — ABNORMAL HIGH (ref 0.8–1.2)
Prothrombin Time: 28.7 seconds — ABNORMAL HIGH (ref 11.4–15.2)

## 2022-01-05 MED ORDER — FULVESTRANT 250 MG/5ML IM SOSY
500.0000 mg | PREFILLED_SYRINGE | Freq: Once | INTRAMUSCULAR | Status: AC
  Administered 2022-01-05: 500 mg via INTRAMUSCULAR
  Filled 2022-01-05: qty 10

## 2022-01-05 NOTE — Progress Notes (Unsigned)
[facility]:    Alicia Barrack, MD [address] [name] [address]   DIAGNOSIS:  Cancer Staging  Malignant neoplasm of upper-outer quadrant of right breast in female, estrogen receptor positive ([place]) Staging form: Breast, AJCC 7th Edition - Clinical: Stage IIA (T1, N1, cM0) - Unsigned Specimen type: Core Needle Biopsy Histopathologic type: 9931 Laterality: Right Staging comments: Staged at breast conference 07/24/13.  - Pathologic: Stage IV (M1) - Unsigned Specimen type: Core Needle Biopsy Histopathologic type: 9931 Laterality: Right   SUMMARY OF ONCOLOGIC HISTORY: 81 y.o. Alicia Vasquez woman status post right breast upper outer quadrant lumpectomy and sentinel lymph node sampling 09/09/2013 for an mpT1c pN1a, stage IIA invasive ductal carcinoma, estrogen and progesterone receptor both 100% positive with strong staining intensity, MIB-1 of 17% and no HER-2 amplification   (1) additional surgery for margin clearance 09/16/2013 obtained negative margins   (2) Oncotype DX recurrence score of 4 predicts a risk of outside the breast recurrence within 10 years of 7% if the patient's only systemic therapy is tamoxifen for 5 years. It also predicts no benefit from chemotherapy   (3) adjuvant radiation completed 01/07/2014   (4) anastrozole started 02/27/2014 stopped within 2 weeks because of arm swelling.              (a) bone density April 2016 showed osteopenia, with a t-score of -1.6             (b) anastrozole resumed 12/17/2015             (c) Bone density 09/20/2016 showed a T score of -2.2   (5) history of left lower extremity DVT 11/23/2012, initially on rivaroxaban, which caused chest pain, switch to Coumadin July 2014             METASTATIC DISEASE: August 2018 (6) status post left total hip replacement 01/16/2017 for estrogen receptor positive adenocarcinoma.             (a) CA 27-29 was 46.4 as of 04/18/2017             (b) chest CT  scan 05/02/2017 shows no lung or liver lesion concern; it does show aortic atherosclerosis             (c) baseline bone scan 05/02/2017 was negative             (d) PET scan 09/12/2017 shows no active disease, including bone             (e) PET scan and CT chest on 05/29/2018 show no active disease             (f) chest CT and bone scan 03/18/2019 showed no evidence of active disease             (g) lumbar spine MRI 01/06/2021 shows multiple compression fractures but no obvious metastases; noncontrast cervical spine and head CT scans 01/04/2021 showed no evidence of neoplastic disease   (7) fulvestrant started 04/18/2017   (8) denosumab/Xgeva started 05/16/2017             (a) changed to every 12-weeks after 10/03/2017 dose              (b) held starting with 06/13/2018 dose due to dental concerns   (9) unprovoked deep vein thrombosis involving the left posterior tibial v documented 11/23/2012, resolved on repeat 06/27/2013 and not recurrent on multiple Dopplers since, most recent 03/06/2018             (  a) on chronic anticoagulation with warfarin given ongoing risks (stage IV breast cancer, relative immobility secondary to claudication  CURRENT THERAPY: Fulvestrant  INTERVAL HISTORY: Alicia Vasquez 81 y.o. female returns for follow up of her metastatic breast cancer.   She was last seen by Wilber Bihari, NP on 01/03/2022 When she was last seen, she was treated with doxycycline for episode of abdominal cellulitis.  She is scheduled to see general surgery on August 18. She continues on Faslodex for metastatic breast cancer.  She also has history of unprovoked DVT and on chronic anticoagulation given her stage IV breast cancer and relative immobility.  Patient Active Problem List   Diagnosis Date Noted   GERD (gastroesophageal reflux disease) 06/17/2021   Bilateral leg edema 06/03/2021   Pressure injury of left buttock, stage 1 06/03/2021   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   Weakness  04/27/2021   Estrogen receptor positive status (ER+) 05/31/2019   Major depressive disorder, single episode, unspecified 05/31/2019   Morbid (severe) obesity due to excess calories ([place]) 05/31/2019   Neoplasm related pain (acute) (chronic) 05/31/2019   Aortic atherosclerosis ([place]) 06/13/2017   Pain from bone metastases ([place]) 01/25/2017   Malignant neoplasm metastatic to bone ([place]) 01/25/2017   Endometrial hyperplasia 01/16/2017   Lytic bone lesion of left femur 01/15/2017   Hip fracture, pathological ([place]) 01/15/2017   Lymphedema 09/17/2015   Long term current use of anticoagulant therapy 08/23/2015   DVT, lower extremity ([place]) 06/18/2015   Bilateral knee pain 04/03/2015   Arm edema 08/28/2014   Vitamin D deficiency 04/23/2014   Hyperparathyroidism ([place]) 04/23/2014   Depression 07/18/2013   Malignant neoplasm of upper-outer quadrant of right breast in female, estrogen receptor positive ([place]) 07/15/2013   Overactive bladder 01/30/2013   Primary hypercoagulable state ([place]) 12/19/2012   HTN (hypertension) 09/27/2011   Hearing loss 09/27/2011   Seasonal allergies 09/27/2011   Hypothyroid 08/26/2011    is allergic to anesthetics, amide; benadryl [diphenhydramine hcl]; carbocaine [mepivacaine hcl]; codeine; epinephrine; sulfa antibiotics; diphenhydramine; latex; penicillins; and tramadol.  MEDICAL HISTORY: Past Medical History:  Diagnosis Date   Allergy    Anxiety    Arthritis    Blood transfusion without reported diagnosis    Breast cancer ([place]) 07/12/2013   Invasive Mammary Carcinoma   DVT (deep vein thrombosis) in pregnancy    Hypertension    Hypothyroid    Metastatic cancer to bone ([place]) dx'd 12/2016   hip   Personal history of radiation therapy    Pneumonia    PONV (postoperative nausea and vomiting)    Radiation 11/21/13-01/07/14   Right Breast/Supraclavicular    SURGICAL HISTORY: Past Surgical History:  Procedure Laterality Date   BREAST LUMPECTOMY Left 2015    BREAST LUMPECTOMY WITH RADIOACTIVE SEED LOCALIZATION Right 09/09/2013   Procedure: BREAST LUMPECTOMY WITH RADIOACTIVE SEED LOCALIZATION WITH AXILLARY NODE EXCISION;  Surgeon: [name], MD;  Location: [facility];  Service: General;  Laterality: Right;   CYSTOSCOPY W/ URETERAL STENT PLACEMENT Right 01/07/2021   Procedure: CYSTOSCOPY WITH RETROGRADE PYELOGRAM/URETERAL STENT PLACEMENT;  Surgeon: Bjorn Loser, MD;  Location: [facility];  Service: Urology;  Laterality: Right;   CYSTOSCOPY/URETEROSCOPY/HOLMIUM LASER/STENT PLACEMENT Right 02/08/2021   Procedure: CYSTOSCOPY, RIGHT URETEROSCOPY, RIGHT RETRGRADE PYELOGRAM, HOLMIUM LASER/STENT PLACEMENT;  Surgeon: Lucas Mallow, MD;  Location: [facility];  Service: Urology;  Laterality: Right;   DENTAL SURGERY  04/19/2012   13 TEETH REMOVED   DILATION AND CURETTAGE OF UTERUS     IR RADIOLOGIST EVAL & MGMT  01/21/2021   ORIF PERIPROSTHETIC FRACTURE Left 01/31/2017   Procedure: REVISION and OPEN REDUCTION INTERNAL FIXATION (ORIF) PERIPROSTHETIC FRACTURE LEFT HIP;  Surgeon: Paralee Cancel, MD;  Location: [facility];  Service: Orthopedics;  Laterality: Left;  120 mins   RE-EXCISION OF BREAST LUMPECTOMY Right 09/24/2013   Procedure: RE-EXCISION OF RIGHT BREAST LUMPECTOMY;  Surgeon: [name], MD;  Location: [facility];  Service: General;  Laterality: Right;   TOTAL HIP ARTHROPLASTY Left 01/16/2017   Procedure: TOTAL HIP ARTHROPLASTY POSTERIOR;  Surgeon: Paralee Cancel, MD;  Location: [facility];  Service: Orthopedics;  Laterality: Left;    SOCIAL HISTORY: Social History   Socioeconomic History   Marital status: Widowed    Spouse name: Not on file   Number of children: 1   Years of education: Not on file   Highest education level: Not on file  Occupational History   Not on file  Tobacco Use   Smoking status: Never   Smokeless tobacco: Never  Vaping Use   Vaping Use: Never used  Substance and Sexual Activity   Alcohol  use: No    Alcohol/week: 0.0 standard drinks of alcohol   Drug use: No   Sexual activity: Never  Other Topics Concern   Not on file  Social History Narrative   Exercise: yard work.   Social Determinants of Health   Financial Resource Strain: Medium Risk (09/16/2021)   Overall Financial Resource Strain (CARDIA)    Difficulty of Paying Living Expenses: Somewhat hard  Food Insecurity: No Food Insecurity (09/13/2021)   Hunger Vital Sign    Worried About Running Out of Food in the Last Year: Never true    Ran Out of Food in the Last Year: Never true  Transportation Needs: No Transportation Needs (09/13/2021)   PRAPARE - Hydrologist (Medical): No    Lack of Transportation (Non-Medical): No  Physical Activity: Inactive (09/13/2021)   Exercise Vital Sign    Days of Exercise per Week: 0 days    Minutes of Exercise per Session: 0 min  Stress: No Stress Concern Present (09/13/2021)   [place]    Feeling of Stress : Only a little  Social Connections: Socially Isolated (09/13/2021)   Social Connection and Isolation Panel [NHANES]    Frequency of Communication with Friends and Family: More than three times a week    Frequency of Social Gatherings with Friends and Family: Three times a week    Attends Religious Services: Never    Active Member of Clubs or Organizations: No    Attends Archivist Meetings: Never    Marital Status: Widowed  Intimate Partner Violence: Not At Risk (09/13/2021)   Humiliation, Afraid, Rape, and Kick questionnaire    Fear of Current or Ex-Partner: No    Emotionally Abused: No    Physically Abused: No    Sexually Abused: No    FAMILY HISTORY: Family History  Problem Relation Age of Onset   Heart disease Brother    Colon cancer Brother    Prostate cancer Brother    REVIEW OF SYSTEMS:   Constitutional: Negative for appetite change, chills, fever and  unexpected weight change. +fatigue HENT: Negative for mouth sores, nosebleeds, sore throat and trouble swallowing.   Eyes: Negative for eye problems and icterus.  Respiratory: Negative for cough, hemoptysis, shortness of breath and wheezing.   Cardiovascular: Negative for chest pain and leg swelling.  Gastrointestinal: Negative for abdominal  pain, diarrhea. +constipation, nausea Genitourinary: Negative for bladder incontinence, difficulty urinating, dysuria, frequency and hematuria.   Musculoskeletal: Negative for back pain, gait problem, neck pain and neck stiffness.  Skin:Negative for rash and ulcers Neurological: Negative for dizziness, extremity weakness, gait problem, headaches, light-headedness and seizures.  Hematological: Negative for adenopathy. Does not bruise/bleed easily.  Psychiatric/Behavioral: Negative for confusion, depression and sleep disturbance. The patient is not nervous/anxious.    PHYSICAL EXAMINATION  ECOG PERFORMANCE STATUS: 3 - Symptomatic, >50% confined to bed  There were no vitals filed for this visit.   Physical Exam Constitutional:      General: She is not in acute distress.    Appearance: Normal appearance. She is not toxic-appearing.  HENT:     Head: Normocephalic and atraumatic.  Eyes:     General: No scleral icterus. Cardiovascular:     Rate and Rhythm: Normal rate and regular rhythm.     Pulses: Normal pulses.     Heart sounds: Normal heart sounds.  Pulmonary:     Effort: Pulmonary effort is normal.     Breath sounds: Normal breath sounds.  Abdominal:     General: Abdomen is flat. Bowel sounds are normal. There is no distension.     Palpations: Abdomen is soft.     Tenderness: There is no abdominal tenderness.  Musculoskeletal:        General: No swelling.     Cervical back: Neck supple.  Lymphadenopathy:     Cervical: No cervical adenopathy.  Skin:    General: Skin is warm and dry.     Findings: No rash.  Neurological:     General:  No focal deficit present.     Mental Status: She is alert.  Psychiatric:        Mood and Affect: Mood normal.        Behavior: Behavior normal.     LABORATORY DATA:  CBC    Component Value Date/Time   WBC 6.8 12/23/2021 1135   RBC 4.47 12/23/2021 1135   HGB 12.7 12/23/2021 1135   HGB 12.5 12/14/2021 1125   HGB 11.6 05/16/2017 1417   HCT 39.5 12/23/2021 1135   HCT 36.7 05/16/2017 1417   PLT 276 12/23/2021 1135   PLT 231 12/14/2021 1125   PLT 323 05/16/2017 1417   MCV 88.4 12/23/2021 1135   MCV 80.8 05/16/2017 1417   MCH 28.4 12/23/2021 1135   MCHC 32.2 12/23/2021 1135   RDW 15.5 12/23/2021 1135   RDW 16.8 (H) 05/16/2017 1417   LYMPHSABS 1.6 12/23/2021 1135   LYMPHSABS 1.1 05/16/2017 1417   MONOABS 0.8 12/23/2021 1135   MONOABS 0.8 05/16/2017 1417   EOSABS 0.3 12/23/2021 1135   EOSABS 0.2 05/16/2017 1417   BASOSABS 0.1 12/23/2021 1135   BASOSABS 0.1 05/16/2017 1417    CMP     Component Value Date/Time   NA 141 12/23/2021 1135   NA 141 05/16/2017 1416   K 4.0 12/23/2021 1135   K 3.8 05/16/2017 1416   CL 106 12/23/2021 1135   CO2 31 12/23/2021 1135   CO2 27 05/16/2017 1416   GLUCOSE 119 (H) 12/23/2021 1135   GLUCOSE 140 05/16/2017 1416   BUN 20 12/23/2021 1135   BUN 13.2 05/16/2017 1416   CREATININE 0.95 12/23/2021 1135   CREATININE 0.95 06/14/2021 1049   CREATININE 0.80 09/22/2017 1555   CREATININE 0.8 05/16/2017 1416   CALCIUM 10.8 (H) 12/23/2021 1135   CALCIUM 11.0 (H) 05/16/2017 1416   PROT 6.8 12/23/2021  1135   PROT 6.9 05/16/2017 1416   ALBUMIN 3.7 12/23/2021 1135   ALBUMIN 3.6 05/16/2017 1416   AST 13 (L) 12/23/2021 1135   AST 10 (L) 06/14/2021 1049   AST 13 05/16/2017 1416   ALT 11 12/23/2021 1135   ALT 10 06/14/2021 1049   ALT <6 05/16/2017 1416   ALKPHOS 125 12/23/2021 1135   ALKPHOS 130 05/16/2017 1416   BILITOT 0.4 12/23/2021 1135   BILITOT 0.4 06/14/2021 1049   BILITOT 0.31 05/16/2017 1416   GFRNONAA 60 (L) 12/23/2021 1135   GFRNONAA  >60 06/14/2021 1049   GFRNONAA 75 08/19/2015 1602   GFRAA >60 08/07/2019 1305   GFRAA >60 03/21/2018 1417   GFRAA 87 08/19/2015 1602     ASSESSMENT and THERAPY PLAN:   Alicia Vasquez is a 81 y.o. 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  #Metastatic breast cancer involving the bone: --Currently on Fulvestrant injection q 4 weeks.  --Last CT scan and bone scan from 07/28/2021 showed stable compression deformities at T12, L4, and L5. No definite evidence osseous metastasis. Plan to repeat CT imaging every 6 months, next one due in September 2023.  --No clinical concerns for progression --She will continue this and repeat imaging and return to clinic to see me end of September.  #H/O Left LE DVT currently on Coumadin: --Current dose is 7.5 mg daily.  --Last INR 2.6  #Fungal infection: --Currently uses ketoconazole cream under her breasts. Sent refill.   Follow up: --Lab only visit in one week to check INR level --4 weeks: labs, f/u visit with [name] and Fulvestrant injection.   All questions were answered. The patient knows to call the clinic with any problems, questions or concerns. We can certainly see the patient much sooner if necessary.  I have spent a total of 30 minutes minutes of face-to-face and non-face-to-face time, preparing to see the patient, performing a medically appropriate examination, counseling and educating the patient, ordering medications/tests, documenting clinical information in the electronic health record, and care coordination.

## 2022-01-06 ENCOUNTER — Encounter: Payer: Self-pay | Admitting: Oncology

## 2022-01-10 ENCOUNTER — Telehealth: Payer: Self-pay

## 2022-01-10 NOTE — Telephone Encounter (Signed)
S/w pt's friend, [name] regarding INR results and recommendation for continued 7.5 mg dose. Attempted to call pt, reached VM which is not set up.

## 2022-01-10 NOTE — Telephone Encounter (Signed)
-----   Message from Gardenia Phlegm, NP sent at 01/05/2022  5:46 PM EDT ----- Please call gladys and Omer (each).  INR is good so far continue current dose which is 7.'5mg'$   ----- Message ----- From: Interface, Lab In [place] Sent: 01/05/2022   3:42 PM EDT To: Gardenia Phlegm, NP

## 2022-01-14 DIAGNOSIS — L03818 Cellulitis of other sites: Secondary | ICD-10-CM | POA: Diagnosis not present

## 2022-01-18 ENCOUNTER — Encounter: Payer: Self-pay | Admitting: Podiatry

## 2022-01-18 ENCOUNTER — Ambulatory Visit (INDEPENDENT_AMBULATORY_CARE_PROVIDER_SITE_OTHER): Payer: Medicare Other | Admitting: Podiatry

## 2022-01-18 DIAGNOSIS — D689 Coagulation defect, unspecified: Secondary | ICD-10-CM

## 2022-01-18 DIAGNOSIS — L600 Ingrowing nail: Secondary | ICD-10-CM

## 2022-01-18 DIAGNOSIS — M79674 Pain in right toe(s): Secondary | ICD-10-CM

## 2022-01-18 DIAGNOSIS — M79675 Pain in left toe(s): Secondary | ICD-10-CM

## 2022-01-18 DIAGNOSIS — B351 Tinea unguium: Secondary | ICD-10-CM

## 2022-01-25 NOTE — Progress Notes (Signed)
  Subjective:  Patient ID: Alicia Vasquez [facility], female    DOB: 21-Sep-1940,  MRN: [id number]  81 y.o. female presents at risk foot care with h/o clotting disorder and painful thick toenails that are difficult to trim. Pain interferes with ambulation. Aggravating factors include wearing enclosed shoe gear. Pain is relieved with periodic professional debridement.  Patient states her great toes are a little sore today.   She states she had an abdominal wound with cellulitis and was treated at [facility].  PCP is Alicia Barrack, MD , and last visit was Oct 26, 2021.  Allergies  Allergen Reactions   Anesthetics, Amide Hypertension   Benadryl [Diphenhydramine Hcl] Other (See Comments)    Dizziness   Carbocaine [Mepivacaine Hcl] Hypertension   Codeine Other (See Comments)    Dizziness   Epinephrine Hypertension   Sulfa Antibiotics Other (See Comments)    dizziness   Diphenhydramine    Latex Other (See Comments)    Blisters in mouth   Penicillins Rash    Has patient had a PCN reaction causing immediate rash, facial/tongue/throat swelling, SOB or lightheadedness with hypotension: Unknown Has patient had a PCN reaction causing severe rash involving mucus membranes or skin necrosis: Unknown Has patient had a PCN reaction that required hospitalization: Unknown Has patient had a PCN reaction occurring within the last 10 years: Unknown If all of the above answers are "NO", then may proceed with Cephalosporin use.    Tramadol Other (See Comments)    Sedation.     Review of Systems: Negative except as noted in the HPI.   Objective:  Alicia Vasquez is a pleasant 81 y.o. female, obese, in NAD. AAO x 3.  Vascular Examination: CFT <3 seconds b/l LE. Faintly palpable pedal pulses b/l LE. Pedal hair absent b/l LE. Skin temperature gradient WNL b/l. No pain with calf compression b/l. No edema b/l LE. No cyanosis or clubbing noted b/l LE.  Neurological Examination: Sensation grossly intact b/l with 10  gram monofilament. Vibratory sensation intact b/l.   Dermatological Examination: Pedal skin with normal turgor, texture and tone b/l. Toenails 2-5 b/l thick, discolored, elongated with subungual debris and pain on dorsal palpation. No hyperkeratotic lesions noted b/l. Incurvated nailplate both borders of left hallux and both borders of right hallux.  Nail border hypertrophy absent. There is tenderness to palpation. Sign(s) of infection: no clinical signs of infection noted on examination today..  Musculoskeletal Examination: Muscle strength 5/5 to b/l LE. No pain, crepitus or joint limitation noted with ROM bilateral LE. No gross bony deformities bilaterally. Utilizes wheelchair for mobility assistance.  Radiographs: None   Assessment:   1. Pain due to onychomycosis of toenails of both feet   2. Ingrown toenail without infection   3. Blood clotting disorder (HCC)    Plan:  -Examined patient. -Patient to continue soft, supportive shoe gear daily. -Toenails 1-5 b/l were debrided in length and girth with sterile nail nippers and dremel without iatrogenic bleeding.  -Offending nail border debrided and curretaged bilateral great toes utilizing sterile nail nipper and currette. Border(s) cleansed with alcohol and TAO applied. Patient/POA/Caregiver/Facility instructed to apply Neosporin Cream  to bilateral great toes once daily for 7 days. Call office if there are any concerns. -Patient/POA to call should there be question/concern in the interim.  Return in about 3 months (around 04/20/2022).  Marzetta Board, DPM

## 2022-01-27 ENCOUNTER — Encounter: Payer: Self-pay | Admitting: Family Medicine

## 2022-01-27 ENCOUNTER — Ambulatory Visit (INDEPENDENT_AMBULATORY_CARE_PROVIDER_SITE_OTHER): Payer: Medicare Other | Admitting: Family Medicine

## 2022-01-27 VITALS — BP 136/73 | HR 80 | Temp 99.5°F | Ht 64.0 in

## 2022-01-27 DIAGNOSIS — C7951 Secondary malignant neoplasm of bone: Secondary | ICD-10-CM

## 2022-01-27 DIAGNOSIS — J302 Other seasonal allergic rhinitis: Secondary | ICD-10-CM

## 2022-01-27 DIAGNOSIS — E039 Hypothyroidism, unspecified: Secondary | ICD-10-CM

## 2022-01-27 DIAGNOSIS — E213 Hyperparathyroidism, unspecified: Secondary | ICD-10-CM

## 2022-01-27 MED ORDER — DOXYCYCLINE HYCLATE 100 MG PO TABS: 100.0000 mg | ORAL_TABLET | Freq: Two times a day (BID) | ORAL | 0 refills | Status: DC

## 2022-01-27 MED ORDER — AZELASTINE HCL 0.1 % NA SOLN: 2.0000 | Freq: Two times a day (BID) | NASAL | 12 refills | Status: DC

## 2022-01-27 NOTE — Assessment & Plan Note (Addendum)
Continue management per endocrinology. 

## 2022-01-27 NOTE — Patient Instructions (Addendum)
It was very nice to see you today!  Please start the doxycycline.  Please come back in 6 months or sooner if needed.   Take care, Dr Jerline Pain  PLEASE NOTE:  If you had any lab tests please let us know if you have not heard back within a few days. You may see your results on mychart before we have a chance to review them but we will give you a call once they are reviewed by [name]. If we ordered any referrals today, please let us know if you have not heard from their office within the next week.   Please try these tips to maintain a healthy lifestyle:  Eat at least 3 REAL meals and 1-2 snacks per day.  Aim for no more than 5 hours between eating.  If you eat breakfast, please do so within one hour of getting up.   Each meal should contain half fruits/vegetables, one quarter protein, and one quarter carbs (no bigger than a computer mouse)  Cut down on sweet beverages. This includes juice, soda, and sweet tea.   Drink at least 1 glass of water with each meal and aim for at least 8 glasses per day  Exercise at least 150 minutes every week.

## 2022-01-27 NOTE — Assessment & Plan Note (Signed)
Continue management per endocrinology. 

## 2022-01-27 NOTE — Progress Notes (Signed)
   Alicia Vasquez is a 81 y.o. female who presents today for an office visit.  Assessment/Plan:  New/Acute Problems: Cellulitis No signs of systemic infection. Will start course of doxycycline. She will let us know if not improving. Discussed reasons to return to care. Follow up as needed.   Chronic Problems Addressed Today: Hyperparathyroidism ([place]) Continue management per endocrinology.  Seasonal allergies We will refill Astelin.  She can continue over-the-counter meds  Hypothyroid Continue management per endocrinology.     Subjective:  HPI:  See a/p for status of chronic conditions.  She was last seen about 3 months ago. Since our visit she has had an episode of cellulitis on her abdominal wall. She has been following with general surgery for this. She had debridement and was given a course of doxycycline.  This has improved significantly and she no longer is following up with surgery.   She has noticed more redness and swelling in her left leg for the last few days. A lot of pain yesterday. No fevers or chills.       Objective:  Physical Exam: BP 136/73   Pulse 80   Temp 99.5 F (37.5 C) (Temporal)   Ht '5\' 4"'$  (1.626 m)   SpO2 93%   BMI 38.28 kg/m   Gen: No acute distress, resting comfortably CV: Regular rate and rhythm with no murmurs appreciated Pulm: Normal work of breathing, clear to auscultation bilaterally with no crackles, wheezes, or rhonchi MSK: Left lower extremity with slight erythema and edema. Tender to palpation.  Neuro: Grossly normal, moves all extremities Psych: Normal affect and thought content      Alicia Vasquez M. Jerline Pain, MD 01/27/2022 1:25 PM

## 2022-01-27 NOTE — Assessment & Plan Note (Signed)
We will refill Astelin.  She can continue over-the-counter meds

## 2022-02-04 ENCOUNTER — Inpatient Hospital Stay: Payer: Medicare Other

## 2022-02-04 ENCOUNTER — Other Ambulatory Visit: Payer: Self-pay

## 2022-02-04 ENCOUNTER — Inpatient Hospital Stay: Payer: Medicare Other | Attending: Oncology | Admitting: Adult Health

## 2022-02-04 ENCOUNTER — Encounter: Payer: Self-pay | Admitting: Adult Health

## 2022-02-04 VITALS — BP 167/78 | HR 90 | Temp 98.6°F | Resp 16 | Ht 64.0 in | Wt 221.5 lb

## 2022-02-04 DIAGNOSIS — Z170 Estrogen receptor positive status [ER+]: Secondary | ICD-10-CM

## 2022-02-04 DIAGNOSIS — Z8042 Family history of malignant neoplasm of prostate: Secondary | ICD-10-CM | POA: Diagnosis not present

## 2022-02-04 DIAGNOSIS — L0390 Cellulitis, unspecified: Secondary | ICD-10-CM | POA: Diagnosis not present

## 2022-02-04 DIAGNOSIS — Z5111 Encounter for antineoplastic chemotherapy: Secondary | ICD-10-CM | POA: Insufficient documentation

## 2022-02-04 DIAGNOSIS — R350 Frequency of micturition: Secondary | ICD-10-CM

## 2022-02-04 DIAGNOSIS — Z7901 Long term (current) use of anticoagulants: Secondary | ICD-10-CM

## 2022-02-04 DIAGNOSIS — I10 Essential (primary) hypertension: Secondary | ICD-10-CM | POA: Insufficient documentation

## 2022-02-04 DIAGNOSIS — R5383 Other fatigue: Secondary | ICD-10-CM | POA: Insufficient documentation

## 2022-02-04 DIAGNOSIS — Z79811 Long term (current) use of aromatase inhibitors: Secondary | ICD-10-CM | POA: Insufficient documentation

## 2022-02-04 DIAGNOSIS — M8580 Other specified disorders of bone density and structure, unspecified site: Secondary | ICD-10-CM | POA: Insufficient documentation

## 2022-02-04 DIAGNOSIS — Z96642 Presence of left artificial hip joint: Secondary | ICD-10-CM | POA: Diagnosis not present

## 2022-02-04 DIAGNOSIS — M899 Disorder of bone, unspecified: Secondary | ICD-10-CM

## 2022-02-04 DIAGNOSIS — M84659P Pathological fracture in other disease, hip, unspecified, subsequent encounter for fracture with malunion: Secondary | ICD-10-CM

## 2022-02-04 DIAGNOSIS — C50411 Malignant neoplasm of upper-outer quadrant of right female breast: Secondary | ICD-10-CM | POA: Insufficient documentation

## 2022-02-04 DIAGNOSIS — C7951 Secondary malignant neoplasm of bone: Secondary | ICD-10-CM | POA: Diagnosis not present

## 2022-02-04 DIAGNOSIS — Z800 Family history of malignant neoplasm of digestive organs: Secondary | ICD-10-CM | POA: Diagnosis not present

## 2022-02-04 DIAGNOSIS — Z86718 Personal history of other venous thrombosis and embolism: Secondary | ICD-10-CM | POA: Insufficient documentation

## 2022-02-04 LAB — COMPREHENSIVE METABOLIC PANEL
ALT: 12 U/L (ref 0–44)
AST: 12 U/L — ABNORMAL LOW (ref 15–41)
Albumin: 4 g/dL (ref 3.5–5.0)
Alkaline Phosphatase: 119 U/L (ref 38–126)
Anion gap: 1 — ABNORMAL LOW (ref 5–15)
BUN: 19 mg/dL (ref 8–23)
CO2: 31 mmol/L (ref 22–32)
Calcium: 11.2 mg/dL — ABNORMAL HIGH (ref 8.9–10.3)
Chloride: 105 mmol/L (ref 98–111)
Creatinine, Ser: 0.82 mg/dL (ref 0.44–1.00)
GFR, Estimated: >60 mL/min (ref >60)
Glucose, Bld: 96 mg/dL (ref 70–99)
Potassium: 4.6 mmol/L (ref 3.5–5.1)
Sodium: 137 mmol/L (ref 135–145)
Total Bilirubin: 0.3 mg/dL (ref 0.3–1.2)
Total Protein: 7.2 g/dL (ref 6.5–8.1)

## 2022-02-04 LAB — CBC WITH DIFFERENTIAL (CANCER CENTER ONLY)
Abs Immature Granulocytes: 0.04 10*3/uL (ref 0.00–0.07)
Basophils Absolute: 0.1 10*3/uL (ref 0.0–0.1)
Basophils Relative: 1 %
Eosinophils Absolute: 0.3 10*3/uL (ref 0.0–0.5)
Eosinophils Relative: 4 %
HCT: 40.6 % (ref 36.0–46.0)
Hemoglobin: 13 g/dL (ref 12.0–15.0)
Immature Granulocytes: 1 %
Lymphocytes Relative: 19 %
Lymphs Abs: 1.4 10*3/uL (ref 0.7–4.0)
MCH: 28.3 pg (ref 26.0–34.0)
MCHC: 32 g/dL (ref 30.0–36.0)
MCV: 88.3 fL (ref 80.0–100.0)
Monocytes Absolute: 1 10*3/uL (ref 0.1–1.0)
Monocytes Relative: 13 %
Neutro Abs: 4.8 10*3/uL (ref 1.7–7.7)
Neutrophils Relative %: 62 %
Platelet Count: 335 10*3/uL (ref 150–400)
RBC: 4.6 MIL/uL (ref 3.87–5.11)
RDW: 14.2 % (ref 11.5–15.5)
WBC Count: 7.6 10*3/uL (ref 4.0–10.5)
nRBC: 0 % (ref 0.0–0.2)

## 2022-02-04 LAB — URINALYSIS, COMPLETE (UACMP) WITH MICROSCOPIC
Bacteria, UA: NONE SEEN
Bilirubin Urine: NEGATIVE
Glucose, UA: NEGATIVE mg/dL
Hgb urine dipstick: NEGATIVE
Ketones, ur: NEGATIVE mg/dL
Leukocytes,Ua: NEGATIVE
Nitrite: NEGATIVE
Protein, ur: NEGATIVE mg/dL
Specific Gravity, Urine: 1.01 (ref 1.005–1.030)
pH: 7 (ref 5.0–8.0)

## 2022-02-04 LAB — PROTIME-INR
INR: 3 — ABNORMAL HIGH (ref 0.8–1.2)
Prothrombin Time: 30.6 seconds — ABNORMAL HIGH (ref 11.4–15.2)

## 2022-02-04 MED ORDER — FULVESTRANT 250 MG/5ML IM SOSY
500.0000 mg | PREFILLED_SYRINGE | Freq: Once | INTRAMUSCULAR | Status: AC
  Administered 2022-02-04: 500 mg via INTRAMUSCULAR
  Filled 2022-02-04: qty 10

## 2022-02-04 MED ORDER — SILVER SULFADIAZINE 1 % EX CREA: TOPICAL_CREAM | CUTANEOUS | 0 refills | Status: DC
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
## 2022-02-05 LAB — URINE CULTURE

## 2022-02-07 ENCOUNTER — Telehealth: Payer: Self-pay

## 2022-02-07 ENCOUNTER — Other Ambulatory Visit: Payer: Self-pay | Admitting: *Deleted

## 2022-02-07 NOTE — Telephone Encounter (Signed)
-----   Message from Gardenia Phlegm, NP sent at 02/07/2022  8:49 AM EDT ----- Please let caregiver gladys know that patient inr is good, continue current coumadin dose.   ----- Message ----- From: Interface, Lab In [facility] Sent: 02/04/2022   1:04 PM EDT To: Gardenia Phlegm, NP

## 2022-02-07 NOTE — Telephone Encounter (Signed)
Called pt's friend, [name] to make her aware of pt's INR and to recommend per NP to stay on the same dose 7.5 mg. [name] verbalized thanks and understanding and will make pt aware.

## 2022-02-08 ENCOUNTER — Telehealth: Payer: Self-pay | Admitting: Licensed Clinical Social Worker

## 2022-02-08 ENCOUNTER — Other Ambulatory Visit: Payer: Self-pay | Admitting: Hematology and Oncology

## 2022-02-08 DIAGNOSIS — Z170 Estrogen receptor positive status [ER+]: Secondary | ICD-10-CM

## 2022-02-08 DIAGNOSIS — C7951 Secondary malignant neoplasm of bone: Secondary | ICD-10-CM

## 2022-02-08 MED ORDER — HYDROCODONE-ACETAMINOPHEN 5-325 MG PO TABS: 1.0000 | ORAL_TABLET | Freq: Four times a day (QID) | ORAL | 0 refills | Status: DC | PRN

## 2022-02-08 NOTE — Telephone Encounter (Signed)
[place] [employer]  CSW attempted to contact pt by phone to offer emotional support after receiving referral from NP due to ongoing concerns with psychosocial issues (NP made report as well). No answer, unable to leave VM.   Olene Godfrey E Candie Gintz, LCSW

## 2022-02-08 NOTE — Progress Notes (Signed)
Hydrocodone prescription refilled.

## 2022-02-10 ENCOUNTER — Encounter: Payer: Self-pay | Admitting: Oncology

## 2022-02-10 NOTE — Telephone Encounter (Signed)
No entry 

## 2022-02-15 ENCOUNTER — Inpatient Hospital Stay: Payer: Medicare Other | Admitting: Nurse Practitioner

## 2022-03-03 ENCOUNTER — Other Ambulatory Visit: Payer: Self-pay | Admitting: *Deleted

## 2022-03-03 ENCOUNTER — Telehealth: Payer: Self-pay | Admitting: Hematology and Oncology

## 2022-03-03 DIAGNOSIS — Z170 Estrogen receptor positive status [ER+]: Secondary | ICD-10-CM

## 2022-03-03 DIAGNOSIS — C7951 Secondary malignant neoplasm of bone: Secondary | ICD-10-CM

## 2022-03-03 MED ORDER — WARFARIN SODIUM 2.5 MG PO TABS: ORAL_TABLET | ORAL | 3 refills | Status: DC

## 2022-03-03 NOTE — Telephone Encounter (Signed)
Contacted patient to scheduled appointments. Left message with appointment details and a call back number if patient had any questions or could not accommodate the time we provided.   

## 2022-03-07 DIAGNOSIS — H2513 Age-related nuclear cataract, bilateral: Secondary | ICD-10-CM | POA: Diagnosis not present

## 2022-03-07 DIAGNOSIS — H5203 Hypermetropia, bilateral: Secondary | ICD-10-CM | POA: Diagnosis not present

## 2022-03-09 ENCOUNTER — Inpatient Hospital Stay: Payer: Medicare Other | Attending: Oncology

## 2022-03-09 ENCOUNTER — Other Ambulatory Visit: Payer: Self-pay | Admitting: *Deleted

## 2022-03-09 ENCOUNTER — Encounter: Payer: Self-pay | Admitting: Hematology and Oncology

## 2022-03-09 ENCOUNTER — Inpatient Hospital Stay (HOSPITAL_BASED_OUTPATIENT_CLINIC_OR_DEPARTMENT_OTHER): Payer: Medicare Other | Admitting: Nurse Practitioner

## 2022-03-09 ENCOUNTER — Inpatient Hospital Stay: Payer: Medicare Other

## 2022-03-09 ENCOUNTER — Other Ambulatory Visit: Payer: Self-pay

## 2022-03-09 ENCOUNTER — Encounter: Payer: Self-pay | Admitting: Nurse Practitioner

## 2022-03-09 ENCOUNTER — Inpatient Hospital Stay (HOSPITAL_BASED_OUTPATIENT_CLINIC_OR_DEPARTMENT_OTHER): Payer: Medicare Other | Admitting: Hematology and Oncology

## 2022-03-09 VITALS — BP 197/99 | HR 87 | Temp 98.1°F | Resp 18 | Ht 64.0 in | Wt 221.8 lb

## 2022-03-09 VITALS — BP 162/69

## 2022-03-09 DIAGNOSIS — M8580 Other specified disorders of bone density and structure, unspecified site: Secondary | ICD-10-CM | POA: Insufficient documentation

## 2022-03-09 DIAGNOSIS — C50411 Malignant neoplasm of upper-outer quadrant of right female breast: Secondary | ICD-10-CM

## 2022-03-09 DIAGNOSIS — Z515 Encounter for palliative care: Secondary | ICD-10-CM

## 2022-03-09 DIAGNOSIS — Z7189 Other specified counseling: Secondary | ICD-10-CM

## 2022-03-09 DIAGNOSIS — Z170 Estrogen receptor positive status [ER+]: Secondary | ICD-10-CM | POA: Insufficient documentation

## 2022-03-09 DIAGNOSIS — I10 Essential (primary) hypertension: Secondary | ICD-10-CM

## 2022-03-09 DIAGNOSIS — Z86718 Personal history of other venous thrombosis and embolism: Secondary | ICD-10-CM

## 2022-03-09 DIAGNOSIS — M899 Disorder of bone, unspecified: Secondary | ICD-10-CM

## 2022-03-09 DIAGNOSIS — R531 Weakness: Secondary | ICD-10-CM

## 2022-03-09 DIAGNOSIS — Z7901 Long term (current) use of anticoagulants: Secondary | ICD-10-CM | POA: Diagnosis not present

## 2022-03-09 DIAGNOSIS — M84659P Pathological fracture in other disease, hip, unspecified, subsequent encounter for fracture with malunion: Secondary | ICD-10-CM

## 2022-03-09 DIAGNOSIS — Z923 Personal history of irradiation: Secondary | ICD-10-CM | POA: Insufficient documentation

## 2022-03-09 DIAGNOSIS — G893 Neoplasm related pain (acute) (chronic): Secondary | ICD-10-CM

## 2022-03-09 DIAGNOSIS — C7951 Secondary malignant neoplasm of bone: Secondary | ICD-10-CM

## 2022-03-09 DIAGNOSIS — E038 Other specified hypothyroidism: Secondary | ICD-10-CM

## 2022-03-09 DIAGNOSIS — Z5111 Encounter for antineoplastic chemotherapy: Secondary | ICD-10-CM | POA: Diagnosis not present

## 2022-03-09 DIAGNOSIS — R530 Neoplastic (malignant) related fatigue: Secondary | ICD-10-CM

## 2022-03-09 LAB — CBC WITH DIFFERENTIAL/PLATELET
Abs Immature Granulocytes: 0.02 10*3/uL (ref 0.00–0.07)
Basophils Absolute: 0.1 10*3/uL (ref 0.0–0.1)
Basophils Relative: 1 %
Eosinophils Absolute: 0.4 10*3/uL (ref 0.0–0.5)
Eosinophils Relative: 4 %
HCT: 39.8 % (ref 36.0–46.0)
Hemoglobin: 13 g/dL (ref 12.0–15.0)
Immature Granulocytes: 0 %
Lymphocytes Relative: 16 %
Lymphs Abs: 1.3 10*3/uL (ref 0.7–4.0)
MCH: 28.4 pg (ref 26.0–34.0)
MCHC: 32.7 g/dL (ref 30.0–36.0)
MCV: 86.9 fL (ref 80.0–100.0)
Monocytes Absolute: 1.1 10*3/uL — ABNORMAL HIGH (ref 0.1–1.0)
Monocytes Relative: 13 %
Neutro Abs: 5.7 10*3/uL (ref 1.7–7.7)
Neutrophils Relative %: 66 %
Platelets: 287 10*3/uL (ref 150–400)
RBC: 4.58 MIL/uL (ref 3.87–5.11)
RDW: 14.9 % (ref 11.5–15.5)
WBC: 8.6 10*3/uL (ref 4.0–10.5)
nRBC: 0 % (ref 0.0–0.2)

## 2022-03-09 LAB — COMPREHENSIVE METABOLIC PANEL
ALT: 10 U/L (ref 0–44)
AST: 12 U/L — ABNORMAL LOW (ref 15–41)
Albumin: 4 g/dL (ref 3.5–5.0)
Alkaline Phosphatase: 125 U/L (ref 38–126)
Anion gap: 6 (ref 5–15)
BUN: 16 mg/dL (ref 8–23)
CO2: 29 mmol/L (ref 22–32)
Calcium: 10.5 mg/dL — ABNORMAL HIGH (ref 8.9–10.3)
Chloride: 106 mmol/L (ref 98–111)
Creatinine, Ser: 0.79 mg/dL (ref 0.44–1.00)
GFR, Estimated: >60 mL/min (ref >60)
Glucose, Bld: 103 mg/dL — ABNORMAL HIGH (ref 70–99)
Potassium: 4.1 mmol/L (ref 3.5–5.1)
Sodium: 141 mmol/L (ref 135–145)
Total Bilirubin: 0.5 mg/dL (ref 0.3–1.2)
Total Protein: 7.2 g/dL (ref 6.5–8.1)

## 2022-03-09 LAB — PROTIME-INR
INR: 1.4 — ABNORMAL HIGH (ref 0.8–1.2)
Prothrombin Time: 16.8 seconds — ABNORMAL HIGH (ref 11.4–15.2)

## 2022-03-09 MED ORDER — FULVESTRANT 250 MG/5ML IM SOSY
500.0000 mg | PREFILLED_SYRINGE | Freq: Once | INTRAMUSCULAR | Status: AC
  Administered 2022-03-09: 500 mg via INTRAMUSCULAR
  Filled 2022-03-09: qty 10

## 2022-03-09 MED ORDER — LISINOPRIL 10 MG PO TABS: 10.0000 mg | ORAL_TABLET | Freq: Every day | ORAL | 1 refills | Status: DC

## 2022-03-09 NOTE — Progress Notes (Signed)
[facility]:    [name], MD [address] [name] [address]   DIAGNOSIS:  Cancer Staging  Malignant neoplasm of upper-outer quadrant of right breast in female, estrogen receptor positive ([place]) Staging form: Breast, AJCC 7th Edition - Clinical: Stage IIA (T1, N1, cM0) - Unsigned Specimen type: Core Needle Biopsy Histopathologic type: 9931 Laterality: Right Staging comments: Staged at breast conference 07/24/13.  - Pathologic: Stage IV (M1) - Unsigned Specimen type: Core Needle Biopsy Histopathologic type: 9931 Laterality: Right   SUMMARY OF ONCOLOGIC HISTORY: 81 y.o. [place] woman status post right breast upper outer quadrant lumpectomy and sentinel lymph node sampling 09/09/2013 for an mpT1c pN1a, stage IIA invasive ductal carcinoma, estrogen and progesterone receptor both 100% positive with strong staining intensity, MIB-1 of 17% and no HER-2 amplification   (1) additional surgery for margin clearance 09/16/2013 obtained negative margins   (2) Oncotype DX recurrence score of 4 predicts a risk of outside the breast recurrence within 10 years of 7% if the patient's only systemic therapy is tamoxifen for 5 years. It also predicts no benefit from chemotherapy   (3) adjuvant radiation completed 01/07/2014   (4) anastrozole started 02/27/2014 stopped within 2 weeks because of arm swelling.              (a) bone density April 2016 showed osteopenia, with a t-score of -1.6             (b) anastrozole resumed 12/17/2015             (c) Bone density 09/20/2016 showed a T score of -2.2   (5) history of left lower extremity DVT 11/23/2012, initially on rivaroxaban, which caused chest pain, switch to Coumadin July 2014             METASTATIC DISEASE: August 2018 (6) status post left total hip replacement 01/16/2017 for estrogen receptor positive adenocarcinoma.             (a) CA 27-29 was 46.4 as of 04/18/2017             (b) chest CT  scan 05/02/2017 shows no lung or liver lesion concern; it does show aortic atherosclerosis             (c) baseline bone scan 05/02/2017 was negative             (d) PET scan 09/12/2017 shows no active disease, including bone             (e) PET scan and CT chest on 05/29/2018 show no active disease             (f) chest CT and bone scan 03/18/2019 showed no evidence of active disease             (g) lumbar spine MRI 01/06/2021 shows multiple compression fractures but no obvious metastases; noncontrast cervical spine and head CT scans 01/04/2021 showed no evidence of neoplastic disease   (7) fulvestrant started 04/18/2017   (8) denosumab/Xgeva started 05/16/2017             (a) changed to every 12-weeks after 10/03/2017 dose              (b) held starting with 06/13/2018 dose due to dental concerns   (9) unprovoked deep vein thrombosis involving the left posterior tibial v documented 11/23/2012, resolved on repeat 06/27/2013 and not recurrent on multiple Dopplers since, most recent 03/06/2018             (  a) on chronic anticoagulation with warfarin given ongoing risks (stage IV breast cancer, relative immobility secondary to claudication  CURRENT THERAPY: Fulvestrant  INTERVAL HISTORY:  Thula Stewart Pruss 81 y.o. female returns for follow up of her metastatic breast cancer.    She was last seen by Wilber Bihari, NP on 01/03/2022 She continues on Faslodex for metastatic breast cancer.  She also has history of unprovoked DVT and on chronic anticoagulation given her stage IV breast cancer and relative immobility. Today she complained of bilateral lower extremity redness as well as some dizziness.  She used to take medication for blood pressure, lisinopril but she has not been taking it, apparently this was stopped.   She is not quite sure of the reason and tells me that her primary care physician has retired and it is very hard to get into the new one's office. She denies any fevers or chills.   Her last imaging was back from March 2023 and that showed no new concern for metastatic disease. Rest of the pertinent 10 point ROS reviewed and negative  Patient Active Problem List   Diagnosis Date Noted   GERD (gastroesophageal reflux disease) 06/17/2021   Bilateral leg edema 06/03/2021   Pressure injury of left buttock, stage 1 06/03/2021   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   Weakness 04/27/2021   Estrogen receptor positive status (ER+) 05/31/2019   Major depressive disorder, single episode, unspecified 05/31/2019   Morbid (severe) obesity due to excess calories ([place]) 05/31/2019   Neoplasm related pain (acute) (chronic) 05/31/2019   Aortic atherosclerosis ([place]) 06/13/2017   Pain from bone metastases ([place]) 01/25/2017   Malignant neoplasm metastatic to bone ([place]) 01/25/2017   Endometrial hyperplasia 01/16/2017   Lytic bone lesion of left femur 01/15/2017   Hip fracture, pathological ([place]) 01/15/2017   Lymphedema 09/17/2015   Long term current use of anticoagulant therapy 08/23/2015   DVT, lower extremity ([place]) 06/18/2015   Bilateral knee pain 04/03/2015   Arm edema 08/28/2014   Vitamin D deficiency 04/23/2014   Hyperparathyroidism ([place]) 04/23/2014   Depression 07/18/2013   Malignant neoplasm of upper-outer quadrant of right breast in female, estrogen receptor positive ([facility]) 07/15/2013   Overactive bladder 01/30/2013   Primary hypercoagulable state ([facility]) 12/19/2012   HTN (hypertension) 09/27/2011   Hearing loss 09/27/2011   Seasonal allergies 09/27/2011   Hypothyroid 08/26/2011    is allergic to anesthetics, amide; benadryl [diphenhydramine hcl]; carbocaine [mepivacaine hcl]; codeine; epinephrine; sulfa antibiotics; diphenhydramine; latex; penicillins; and tramadol.  MEDICAL HISTORY: Past Medical History:  Diagnosis Date   Allergy    Anxiety    Arthritis    Blood transfusion without reported diagnosis    Breast cancer ([facility]) 07/12/2013   Invasive Mammary Carcinoma   DVT (deep  vein thrombosis) in pregnancy    Hypertension    Hypothyroid    Metastatic cancer to bone ([place]) dx'd 12/2016   hip   Personal history of radiation therapy    Pneumonia    PONV (postoperative nausea and vomiting)    Radiation 11/21/13-01/07/14   Right Breast/Supraclavicular    SURGICAL HISTORY: Past Surgical History:  Procedure Laterality Date   BREAST LUMPECTOMY Left 2015   BREAST LUMPECTOMY WITH RADIOACTIVE SEED LOCALIZATION Right 09/09/2013   Procedure: BREAST LUMPECTOMY WITH RADIOACTIVE SEED LOCALIZATION WITH AXILLARY NODE EXCISION;  Surgeon: [name], MD;  Location: [facility];  Service: General;  Laterality: Right;   CYSTOSCOPY W/ URETERAL STENT PLACEMENT Right 01/07/2021   Procedure: CYSTOSCOPY WITH RETROGRADE PYELOGRAM/URETERAL STENT PLACEMENT;  Surgeon: Matilde Sprang,  Lorin Picket, MD;  Location: [facility];  Service: Urology;  Laterality: Right;   CYSTOSCOPY/URETEROSCOPY/HOLMIUM LASER/STENT PLACEMENT Right 02/08/2021   Procedure: CYSTOSCOPY, RIGHT URETEROSCOPY, RIGHT RETRGRADE PYELOGRAM, HOLMIUM LASER/STENT PLACEMENT;  Surgeon: Crista Elliot, MD;  Location: [facility];  Service: Urology;  Laterality: Right;   DENTAL SURGERY  04/19/2012   13 TEETH REMOVED   DILATION AND CURETTAGE OF UTERUS     IR RADIOLOGIST EVAL & MGMT  01/21/2021   ORIF PERIPROSTHETIC FRACTURE Left 01/31/2017   Procedure: REVISION and OPEN REDUCTION INTERNAL FIXATION (ORIF) PERIPROSTHETIC FRACTURE LEFT HIP;  Surgeon: Durene Romans, MD;  Location: [facility];  Service: Orthopedics;  Laterality: Left;  120 mins   RE-EXCISION OF BREAST LUMPECTOMY Right 09/24/2013   Procedure: RE-EXCISION OF RIGHT BREAST LUMPECTOMY;  Surgeon: Emelia Loron, MD;  Location: [facility];  Service: General;  Laterality: Right;   TOTAL HIP ARTHROPLASTY Left 01/16/2017   Procedure: TOTAL HIP ARTHROPLASTY POSTERIOR;  Surgeon: Durene Romans, MD;  Location: [facility];  Service: Orthopedics;  Laterality: Left;    SOCIAL  HISTORY: Social History   Socioeconomic History   Marital status: Widowed    Spouse name: Not on file   Number of children: 1   Years of education: Not on file   Highest education level: Not on file  Occupational History   Not on file  Tobacco Use   Smoking status: Never   Smokeless tobacco: Never  Vaping Use   Vaping Use: Never used  Substance and Sexual Activity   Alcohol use: No    Alcohol/week: 0.0 standard drinks of alcohol   Drug use: No   Sexual activity: Never  Other Topics Concern   Not on file  Social History Narrative   Exercise: yard work.   Social Determinants of Health   Financial Resource Strain: Medium Risk (09/16/2021)   Overall Financial Resource Strain (CARDIA)    Difficulty of Paying Living Expenses: Somewhat hard  Food Insecurity: No Food Insecurity (09/13/2021)   Hunger Vital Sign    Worried About Running Out of Food in the Last Year: Never true    Ran Out of Food in the Last Year: Never true  Transportation Needs: No Transportation Needs (09/13/2021)   PRAPARE - Administrator, Civil Service (Medical): No    Lack of Transportation (Non-Medical): No  Physical Activity: Inactive (09/13/2021)   Exercise Vital Sign    Days of Exercise per Week: 0 days    Minutes of Exercise per Session: 0 min  Stress: No Stress Concern Present (09/13/2021)   [employer] of Occupational Health - Occupational Stress Questionnaire    Feeling of Stress : Only a little  Social Connections: Socially Isolated (09/13/2021)   Social Connection and Isolation Panel [NHANES]    Frequency of Communication with Friends and Family: More than three times a week    Frequency of Social Gatherings with Friends and Family: Three times a week    Attends Religious Services: Never    Active Member of Clubs or Organizations: No    Attends Banker Meetings: Never    Marital Status: Widowed  Intimate Partner Violence: Not At Risk (09/13/2021)   Humiliation,  Afraid, Rape, and Kick questionnaire    Fear of Current or Ex-Partner: No    Emotionally Abused: No    Physically Abused: No    Sexually Abused: No    FAMILY HISTORY: Family History  Problem Relation Age of Onset   Heart disease Brother  Colon cancer Brother    Prostate cancer Brother      PHYSICAL EXAMINATION  ECOG PERFORMANCE STATUS: 3 - Symptomatic, >50% confined to bed  Vitals:   03/09/22 1144  BP: (!) 197/99  Pulse: 87  Resp: 18  Temp: 98.1 F (36.7 C)  SpO2: 98%    Physical Exam Constitutional:      General: She is not in acute distress.    Appearance: Normal appearance. She is ill-appearing (She is chronically ill, arrived in a wheelchair). She is not toxic-appearing.  HENT:     Head: Normocephalic and atraumatic.  Eyes:     General: No scleral icterus. Cardiovascular:     Rate and Rhythm: Normal rate and regular rhythm.  Abdominal:     Comments: Skin rash over the abdomen appears to be healing well, no concern for worsening infection at this time  Musculoskeletal:        General: Swelling (Chronic venous stasis changes, no overt concern for cellulitis.  Bilateral lower extremity 1+ swelling which appeared stable overall) present.     Cervical back: Neck supple.  Lymphadenopathy:     Cervical: No cervical adenopathy.  Skin:    General: Skin is warm and dry.     Findings: No rash.  Neurological:     General: No focal deficit present.     Mental Status: She is alert.     LABORATORY DATA:  CBC    Component Value Date/Time   WBC 8.6 03/09/2022 1133   RBC 4.58 03/09/2022 1133   HGB 13.0 03/09/2022 1133   HGB 13.0 02/04/2022 1245   HGB 11.6 05/16/2017 1417   HCT 39.8 03/09/2022 1133   HCT 36.7 05/16/2017 1417   PLT 287 03/09/2022 1133   PLT 335 02/04/2022 1245   PLT 323 05/16/2017 1417   MCV 86.9 03/09/2022 1133   MCV 80.8 05/16/2017 1417   MCH 28.4 03/09/2022 1133   MCHC 32.7 03/09/2022 1133   RDW 14.9 03/09/2022 1133   RDW 16.8 (H)  05/16/2017 1417   LYMPHSABS 1.3 03/09/2022 1133   LYMPHSABS 1.1 05/16/2017 1417   MONOABS 1.1 (H) 03/09/2022 1133   MONOABS 0.8 05/16/2017 1417   EOSABS 0.4 03/09/2022 1133   EOSABS 0.2 05/16/2017 1417   BASOSABS 0.1 03/09/2022 1133   BASOSABS 0.1 05/16/2017 1417    CMP     Component Value Date/Time   NA 141 03/09/2022 1133   NA 141 05/16/2017 1416   K 4.1 03/09/2022 1133   K 3.8 05/16/2017 1416   CL 106 03/09/2022 1133   CO2 29 03/09/2022 1133   CO2 27 05/16/2017 1416   GLUCOSE 103 (H) 03/09/2022 1133   GLUCOSE 140 05/16/2017 1416   BUN 16 03/09/2022 1133   BUN 13.2 05/16/2017 1416   CREATININE 0.79 03/09/2022 1133   CREATININE 0.95 06/14/2021 1049   CREATININE 0.80 09/22/2017 1555   CREATININE 0.8 05/16/2017 1416   CALCIUM 10.5 (H) 03/09/2022 1133   CALCIUM 11.0 (H) 05/16/2017 1416   PROT 7.2 03/09/2022 1133   PROT 6.9 05/16/2017 1416   ALBUMIN 4.0 03/09/2022 1133   ALBUMIN 3.6 05/16/2017 1416   AST 12 (L) 03/09/2022 1133   AST 10 (L) 06/14/2021 1049   AST 13 05/16/2017 1416   ALT 10 03/09/2022 1133   ALT 10 06/14/2021 1049   ALT <6 05/16/2017 1416   ALKPHOS 125 03/09/2022 1133   ALKPHOS 130 05/16/2017 1416   BILITOT 0.5 03/09/2022 1133   BILITOT 0.4 06/14/2021 1049  BILITOT 0.31 05/16/2017 1416   GFRNONAA >60 03/09/2022 1133   GFRNONAA >60 06/14/2021 1049   GFRNONAA 75 08/19/2015 1602   GFRAA >60 08/07/2019 1305   GFRAA >60 03/21/2018 1417   GFRAA 87 08/19/2015 1602     ASSESSMENT and THERAPY PLAN:   Davine Sweney Dimichele is a 81 y.o. 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  #Metastatic breast cancer involving the bone: --Currently on Fulvestrant injection q 4 weeks.  --Last CT scan and bone scan from 07/28/2021 showed stable compression deformities at T12, L4, and L5. No definite evidence osseous metastasis. Plan to repeat CT imaging every 6  months, next one due in September 2023.  --No clinical concerns for progression -- She was strongly encouraged to continue Faslodex until progression or other concerns for toxicity.  She is very limited from mobility standpoint and has multiple other comorbidities hence going for imaging is very challenging for her.  She currently has no clinical concerns for progression hence we will try to repeat imaging annually or as needed.  #H/O Left LE DVT currently on Coumadin: Upon review, she appears to have had unprovoked DVT back in 2014 by Dr. Jana Hakim elected to continue and definite anticoagulation because of her sedentary status and metastatic breast cancer. During her last visit we have talked about palliative care and goals of care discussion.  I am not entirely sure if she needs to continue indefinite anticoagulation given her comorbidities and lack of active malignancy but she definitely is going to be a high risk for DVT given her sedentary status.  If she has any complications from a blood thinner such as bleeding or has difficulty maintaining a therapeutic INR, will discontinue warfarin at that time.  She was worried about possible cellulitis, I do not see an overt sign of cellulitis.  She has a prescription for doxycycline which she can choose to take. With regards to hypertension, we discussed about increased risk of heart attacks and stroke with uncontrolled hypertension.  She used to take lisinopril and she tells me that it is very hard to get back into her PCPs office hence we will go ahead and prescribe the lisinopril 10 mg daily. Return to clinic every 4 weeks with [name] and every 3 months with me  All questions were answered. The patient knows to call the clinic with any problems, questions or concerns. We can certainly see the patient much sooner if necessary.  I have spent a total of 30 minutes minutes of face-to-face and non-face-to-face time, preparing to see the patient, performing  a medically appropriate examination, counseling and educating the patient, ordering medications/tests, documenting clinical information in the electronic health record, and care coordination.

## 2022-03-09 NOTE — Patient Instructions (Signed)
Fulvestrant injection °What is this medication? °FULVESTRANT (ful VES trant) blocks the effects of estrogen. It is used to treat breast cancer. °This medicine may be used for other purposes; ask your health care provider or pharmacist if you have questions. °COMMON BRAND NAME(S): FASLODEX °What should I tell my care team before I take this medication? °They need to know if you have any of these conditions: °bleeding disorders °liver disease °low blood counts, like low white cell, platelet, or red cell counts °an unusual or allergic reaction to fulvestrant, other medicines, foods, dyes, or preservatives °pregnant or trying to get pregnant °breast-feeding °How should I use this medication? °This medicine is for injection into a muscle. It is usually given by a health care professional in a hospital or clinic setting. °Talk to your pediatrician regarding the use of this medicine in children. Special care may be needed. °Overdosage: If you think you have taken too much of this medicine contact a poison control center or emergency room at once. °NOTE: This medicine is only for you. Do not share this medicine with others. °What if I miss a dose? °It is important not to miss your dose. Call your doctor or health care professional if you are unable to keep an appointment. °What may interact with this medication? °medicines that treat or prevent blood clots like warfarin, enoxaparin, dalteparin, apixaban, dabigatran, and rivaroxaban °This list may not describe all possible interactions. Give your health care provider a list of all the medicines, herbs, non-prescription drugs, or dietary supplements you use. Also tell them if you smoke, drink alcohol, or use illegal drugs. Some items may interact with your medicine. °What should I watch for while using this medication? °Your condition will be monitored carefully while you are receiving this medicine. You will need important blood work done while you are taking this  medicine. °Do not become pregnant while taking this medicine or for at least 1 year after stopping it. Women of child-bearing potential will need to have a negative pregnancy test before starting this medicine. Women should inform their doctor if they wish to become pregnant or think they might be pregnant. There is a potential for serious side effects to an unborn child. Men should inform their doctors if they wish to father a child. This medicine may lower sperm counts. Talk to your health care professional or pharmacist for more information. Do not breast-feed an infant while taking this medicine or for 1 year after the last dose. °What side effects may I notice from receiving this medication? °Side effects that you should report to your doctor or health care professional as soon as possible: °allergic reactions like skin rash, itching or hives, swelling of the face, lips, or tongue °feeling faint or lightheaded, falls °pain, tingling, numbness, or weakness in the legs °signs and symptoms of infection like fever or chills; cough; flu-like symptoms; sore throat °vaginal bleeding °Side effects that usually do not require medical attention (report to your doctor or health care professional if they continue or are bothersome): °aches, pains °constipation °diarrhea °headache °hot flashes °nausea, vomiting °pain at site where injected °stomach pain °This list may not describe all possible side effects. Call your doctor for medical advice about side effects. You may report side effects to FDA at 1-800-FDA-1088. °Where should I keep my medication? °This drug is given in a hospital or clinic and will not be stored at home. °NOTE: This sheet is a summary. It may not cover all possible information. If you have   questions about this medicine, talk to your doctor, pharmacist, or health care provider. °© 2022 Elsevier/Gold Standard (2017-08-29 00:00:00) ° °

## 2022-03-09 NOTE — Progress Notes (Signed)
Printed patient's upcoming appointments and provided to patient.

## 2022-03-09 NOTE — Progress Notes (Signed)
[facility]  [phone] [phone]   Name: Alicia Vasquez Date: 03/09/2022 MRN: [id number]  DOB: 04-Jul-1940  Patient Care Team: [name], MD as PCP - General (Family Medicine) Philemon Kingdom, MD as Consulting Physician (Internal Medicine) Caprice Renshaw, MD as Referring Physician (Internal Medicine) Regal, Tamala Fothergill, DPM as Consulting Physician (Podiatry) Bjorn Loser, MD as Consulting Physician (Urology) Benay Pike, MD as Consulting Physician (Hematology and Oncology) Paralee Cancel, MD as Consulting Physician (Orthopedic Surgery)    REASON FOR CONSULTATION: Alicia Vasquez is a 81 y.o. female with oncology medical history including right breast cancer ER positive s/p adjuvant radiation (2015), found to have metastatic disease August 2018 s/p left total hip replacement, multiple compression fractures, currently on fulvestrant therapy.  Palliative ask to see for symptom management and goals of care.    SOCIAL HISTORY:     reports that she has never smoked. She has never used smokeless tobacco. She reports that she does not drink alcohol and does not use drugs.  ADVANCE DIRECTIVES:  Patient reports she does not have a completed advanced directive.  She is interested in completing documents.  Reports she has packet on hand at home.  Referral placed for advanced directive clinic.  CODE STATUS: Full code  PAST MEDICAL HISTORY: Past Medical History:  Diagnosis Date   Allergy    Anxiety    Arthritis    Blood transfusion without reported diagnosis    Breast cancer ([facility]) 07/12/2013   Invasive Mammary Carcinoma   DVT (deep vein thrombosis) in pregnancy    Hypertension    Hypothyroid    Metastatic cancer to bone ([facility]) dx'd 12/2016   hip   Personal history of radiation therapy    Pneumonia    PONV (postoperative nausea and vomiting)    Radiation 11/21/13-01/07/14   Right Breast/Supraclavicular     PAST SURGICAL HISTORY:  Past Surgical History:  Procedure Laterality Date   BREAST LUMPECTOMY Left 2015   BREAST LUMPECTOMY WITH RADIOACTIVE SEED LOCALIZATION Right 09/09/2013   Procedure: BREAST LUMPECTOMY WITH RADIOACTIVE SEED LOCALIZATION WITH AXILLARY NODE EXCISION;  Surgeon: [name], MD;  Location: [facility];  Service: General;  Laterality: Right;   CYSTOSCOPY W/ URETERAL STENT PLACEMENT Right 01/07/2021   Procedure: CYSTOSCOPY WITH RETROGRADE PYELOGRAM/URETERAL STENT PLACEMENT;  Surgeon: Bjorn Loser, MD;  Location: [facility];  Service: Urology;  Laterality: Right;   CYSTOSCOPY/URETEROSCOPY/HOLMIUM LASER/STENT PLACEMENT Right 02/08/2021   Procedure: CYSTOSCOPY, RIGHT URETEROSCOPY, RIGHT RETRGRADE PYELOGRAM, HOLMIUM LASER/STENT PLACEMENT;  Surgeon: Lucas Mallow, MD;  Location: [facility];  Service: Urology;  Laterality: Right;   DENTAL SURGERY  04/19/2012   13 TEETH REMOVED   DILATION AND CURETTAGE OF UTERUS     IR RADIOLOGIST EVAL & MGMT  01/21/2021   ORIF PERIPROSTHETIC FRACTURE Left 01/31/2017   Procedure: REVISION and OPEN REDUCTION INTERNAL FIXATION (ORIF) PERIPROSTHETIC FRACTURE LEFT HIP;  Surgeon: Paralee Cancel, MD;  Location: [facility];  Service: Orthopedics;  Laterality: Left;  120 mins   RE-EXCISION OF BREAST LUMPECTOMY Right 09/24/2013   Procedure: RE-EXCISION OF RIGHT BREAST LUMPECTOMY;  Surgeon: [name], MD;  Location: [facility];  Service: General;  Laterality: Right;   TOTAL HIP ARTHROPLASTY Left 01/16/2017   Procedure: TOTAL HIP ARTHROPLASTY POSTERIOR;  Surgeon: Paralee Cancel, MD;  Location: [facility];  Service: Orthopedics;  Laterality: Left;    HEMATOLOGY/ONCOLOGY HISTORY:  Oncology History   No history exists.    ALLERGIES:  is allergic to anesthetics, amide; benadryl [diphenhydramine hcl]; carbocaine [mepivacaine hcl]; codeine; epinephrine; sulfa antibiotics; diphenhydramine; latex; penicillins; and  tramadol.  MEDICATIONS:  Current Outpatient Medications  Medication Sig Dispense Refill   azelastine (ASTELIN) 0.1 % nasal spray Place 2 sprays into both nostrils 2 (two) times daily. 30 mL 12   Cholecalciferol (VITAMIN D3) 5000 UNITS TABS Take 5,000 Units by mouth daily.      Cholecalciferol 125 MCG (5000 UT) capsule      doxycycline (VIBRA-TABS) 100 MG tablet Take 1 tablet (100 mg total) by mouth 2 (two) times daily. 20 tablet 0   HYDROcodone-acetaminophen (NORCO/VICODIN) 5-325 MG tablet Take 1 tablet by mouth every 6 (six) hours as needed for moderate pain. 120 tablet 0   ketoconazole (NIZORAL) 2 % cream Apply 1 Application topically daily. 15 g 0   levothyroxine (SYNTHROID) 125 MCG tablet Take by mouth.     lisinopril (PRINIVIL) 10 MG tablet Take 1 tablet (10 mg total) by mouth daily. 30 tablet 1   ondansetron (ZOFRAN) 8 MG tablet Take 1 tablet (8 mg total) by mouth every 8 (eight) hours as needed for nausea or vomiting. 90 tablet 0   silver sulfADIAZINE (SILVADENE) 1 % cream Apply a thin layer under affected breast once a day 50 g 0   warfarin (COUMADIN) 2.5 MG tablet Take 7.'5mg'$  daily 30 tablet 3   warfarin (COUMADIN) 5 MG tablet Take 7.'5mg'$  daily 90 tablet 1   No current facility-administered medications for this visit.    VITAL SIGNS: BP (!) 162/69 (BP Location: Left Arm, Patient Position: Sitting, Cuff Size: Large) Comment: Nikki NP aware There were no vitals filed for this visit.  Estimated body mass index is 38.07 kg/m as calculated from the following:   Height as of an earlier encounter on 03/09/22: '5\' 4"'$  (1.626 m).   Weight as of an earlier encounter on 03/09/22: 221 lb 12.8 oz (100.6 kg).  LABS: CBC:    Component Value Date/Time   WBC 8.6 03/09/2022 1133   HGB 13.0 03/09/2022 1133   HGB 13.0 02/04/2022 1245   HGB 11.6 05/16/2017 1417   HCT 39.8 03/09/2022 1133   HCT 36.7 05/16/2017 1417   PLT 287 03/09/2022 1133   PLT 335 02/04/2022 1245   PLT 323 05/16/2017 1417    MCV 86.9 03/09/2022 1133   MCV 80.8 05/16/2017 1417   NEUTROABS 5.7 03/09/2022 1133   NEUTROABS 4.8 05/16/2017 1417   LYMPHSABS 1.3 03/09/2022 1133   LYMPHSABS 1.1 05/16/2017 1417   MONOABS 1.1 (H) 03/09/2022 1133   MONOABS 0.8 05/16/2017 1417   EOSABS 0.4 03/09/2022 1133   EOSABS 0.2 05/16/2017 1417   BASOSABS 0.1 03/09/2022 1133   BASOSABS 0.1 05/16/2017 1417   Comprehensive Metabolic Panel:    Component Value Date/Time   NA 141 03/09/2022 1133   NA 141 05/16/2017 1416   K 4.1 03/09/2022 1133   K 3.8 05/16/2017 1416   CL 106 03/09/2022 1133   CO2 29 03/09/2022 1133   CO2 27 05/16/2017 1416   BUN 16 03/09/2022 1133   BUN 13.2 05/16/2017 1416   CREATININE 0.79 03/09/2022 1133   CREATININE 0.95 06/14/2021 1049   CREATININE 0.80 09/22/2017 1555   CREATININE 0.8 05/16/2017 1416   GLUCOSE 103 (H) 03/09/2022 1133   GLUCOSE 140 05/16/2017 1416   CALCIUM 10.5 (H) 03/09/2022 1133   CALCIUM 11.0 (H) 05/16/2017 1416   AST 12 (L) 03/09/2022 1133   AST 10 (L) 06/14/2021 1049   AST  13 05/16/2017 1416   ALT 10 03/09/2022 1133   ALT 10 06/14/2021 1049   ALT <6 05/16/2017 1416   ALKPHOS 125 03/09/2022 1133   ALKPHOS 130 05/16/2017 1416   BILITOT 0.5 03/09/2022 1133   BILITOT 0.4 06/14/2021 1049   BILITOT 0.31 05/16/2017 1416   PROT 7.2 03/09/2022 1133   PROT 6.9 05/16/2017 1416   ALBUMIN 4.0 03/09/2022 1133   ALBUMIN 3.6 05/16/2017 1416    RADIOGRAPHIC STUDIES: No results found.  PERFORMANCE STATUS (ECOG) : 3 - Symptomatic, >50% confined to bed  Review of Systems  Constitutional:  Positive for fatigue.  Cardiovascular:  Positive for leg swelling.  Musculoskeletal:  Positive for arthralgias.  Neurological:  Positive for weakness.  Unless otherwise noted, a complete review of systems is negative.  Physical Exam General: NAD, wheelchair-bound Cardiovascular: regular rate and rhythm Pulmonary: Normal breathing pattern, clear lungs bilaterally Abdomen: soft, nontender,  + bowel sounds Extremities: Lower extremity edema, no joint deformities Skin: no rashes, dry, no evidence of cellulitis Neurological: AAOx3, mood appropriate  IMPRESSION: This is my initial visit with Alicia Vasquez.  She presents to the clinic alone today.  She is in a wheelchair.  Alert and oriented able to engage appropriately in discussions.  I introduced myself, [place] RN, and Palliative's role in collaboration with the oncology team. Concept of Palliative Care was introduced as specialized medical care for people and their families living with serious illness.  It focuses on providing relief from the symptoms and stress of a serious illness.  The goal is to improve quality of life for both the patient and the family. Values and goals of care important to patient and family were attempted to be elicited.   Alicia Vasquez shares she is currently living in a extended stay hotel due to needed renovations at her home.  Her son also lives with her (only child) however seems to have significant health concerns of his own.  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  Her husband passed away in 08-30-2005.  At home she performs most ADLs independently.  Her son or friends will run errands as needed.  Reports her son provides all of her meals.  Is wheelchair-bound due to left hip discomfort and multiple surgeries.  Is able to maneuver herself around her room and toilet herself.  Alicia Vasquez denies any nausea, vomiting, diarrhea, or constipation.  Does endorse occasional left hip pain which radiates down her leg.  She is taking hydrocodone.  States she generally takes 3-4 times daily as prescribed.  Her pain is somewhat decreased with medication however still has some discomfort.  She understands we will continue to closely monitor and make adjustments as needed.  If patient continues to have ongoing pain could potentially consider long-acting however with some hesitancy due to living situations.   We  discussed her current illness and what it means in the larger context of Her on-going co-morbidities. Natural disease trajectory and expectations were discussed.  Alicia Vasquez is realistic in her understanding.  Her goal is to continue to treat the treatable allow her every opportunity to continue to thrive.  She would not wish to suffer as we discussed her pain.  I empathetically approach discussions regarding her CODE STATUS and advanced directives.  She shares she has not had these discussions with her son because he does not wish to discuss.  I encouraged her to continue to attempt to have discussions with him or someone she feels appropriate  to make decisions on her behalf if she was ever unable to do so acknowledging the importance of being prepared for an emergency situation or further health decline.  She verbalized understanding.  States plans to follow-up with an attorney in regards to her financial assets.  We discussed completion of her advanced directive naming her medical decision-maker.  She is interested in completing.  Dawn, RN provided patient with material to the advanced directive clinic.  I discussed with patient her current full CODE STATUS with consideration of her current illness and comorbidities.  She is clear in her expressed wishes to maintain a full CODE STATUS at this time.  I discussed the importance of continued conversation with family and their medical providers regarding overall plan of care and treatment options, ensuring decisions are within the context of the patients values and GOCs.  PLAN: Established therapeutic relationship. Education provided on palliative's role in collaboration with their Oncology/Radiation team. Goals of care discussions.  Patient is clear and expressed wishes to continue to treat the treatable allow her every opportunity to continue to thrive.  Request to remain a full code at this time. Would like to complete advanced directive.   Educational material has been provided for the advance directive clinic. Hydrocodone 5/325 every 6 hours as needed for breakthrough pain.  We will continue to closely monitor and adjust as needed.  Could potentially consider long-acting medication however with hesitancy due to her living arrangements. Ongoing goals of care discussions and symptom management support. I will plan to see patient back in 2-4 weeks in collaboration to other oncology appointments.   Patient expressed understanding and was in agreement with this plan. She also understands that She can call the clinic at any time with any questions, concerns, or complaints.   Thank you for your referral and allowing Palliative to assist in Alicia Vasquez's care.   Number and complexity of problems addressed: HIGH - 1 or more chronic illnesses with SEVERE exacerbation, progression, or side effects of treatment - advanced cancer, pain. Any controlled substances utilized were prescribed in the context of palliative care.  Time Total: 50 min  Visit consisted of counseling and education dealing with the complex and emotionally intense issues of symptom management and palliative care in the setting of serious and potentially life-threatening illness.Greater than 50%  of this time was spent counseling and coordinating care related to the above assessment and plan.  Signed by: Alda Lea, AGPCNP-BC Palliative Medicine Team/[facility] [place]

## 2022-03-10 ENCOUNTER — Other Ambulatory Visit: Payer: Medicare Other

## 2022-03-10 ENCOUNTER — Inpatient Hospital Stay: Payer: Medicare Other | Admitting: Licensed Clinical Social Worker
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
## 2022-03-11 ENCOUNTER — Other Ambulatory Visit: Payer: Self-pay | Admitting: Hematology and Oncology

## 2022-03-11 DIAGNOSIS — Z170 Estrogen receptor positive status [ER+]: Secondary | ICD-10-CM

## 2022-03-11 DIAGNOSIS — C7951 Secondary malignant neoplasm of bone: Secondary | ICD-10-CM

## 2022-03-11 MED ORDER — HYDROCODONE-ACETAMINOPHEN 5-325 MG PO TABS: 1.0000 | ORAL_TABLET | Freq: Four times a day (QID) | ORAL | 0 refills | Status: DC | PRN

## 2022-03-14 ENCOUNTER — Emergency Department (HOSPITAL_COMMUNITY): Payer: Medicare Other

## 2022-03-14 ENCOUNTER — Encounter (HOSPITAL_COMMUNITY): Payer: Self-pay

## 2022-03-14 ENCOUNTER — Other Ambulatory Visit: Payer: Self-pay

## 2022-03-14 ENCOUNTER — Inpatient Hospital Stay (HOSPITAL_COMMUNITY)
Admission: EM | Admit: 2022-03-14 | Discharge: 2022-03-17 | DRG: 536 | Disposition: A | Discharge status: Skilled Nursing Facility | Payer: Medicare Other | Attending: Internal Medicine | Admitting: Internal Medicine

## 2022-03-14 ENCOUNTER — Telehealth: Payer: Self-pay | Admitting: *Deleted

## 2022-03-14 DIAGNOSIS — S72142A Displaced intertrochanteric fracture of left femur, initial encounter for closed fracture: Principal | ICD-10-CM | POA: Diagnosis present

## 2022-03-14 DIAGNOSIS — Z8249 Family history of ischemic heart disease and other diseases of the circulatory system: Secondary | ICD-10-CM

## 2022-03-14 DIAGNOSIS — M1712 Unilateral primary osteoarthritis, left knee: Secondary | ICD-10-CM | POA: Diagnosis not present

## 2022-03-14 DIAGNOSIS — Z8042 Family history of malignant neoplasm of prostate: Secondary | ICD-10-CM | POA: Diagnosis not present

## 2022-03-14 DIAGNOSIS — Z800 Family history of malignant neoplasm of digestive organs: Secondary | ICD-10-CM | POA: Diagnosis not present

## 2022-03-14 DIAGNOSIS — M6281 Muscle weakness (generalized): Secondary | ICD-10-CM | POA: Diagnosis not present

## 2022-03-14 DIAGNOSIS — R41841 Cognitive communication deficit: Secondary | ICD-10-CM | POA: Diagnosis not present

## 2022-03-14 DIAGNOSIS — I825Z9 Chronic embolism and thrombosis of unspecified deep veins of unspecified distal lower extremity: Secondary | ICD-10-CM | POA: Diagnosis not present

## 2022-03-14 DIAGNOSIS — Y9289 Other specified places as the place of occurrence of the external cause: Secondary | ICD-10-CM

## 2022-03-14 DIAGNOSIS — W010XXA Fall on same level from slipping, tripping and stumbling without subsequent striking against object, initial encounter: Secondary | ICD-10-CM | POA: Diagnosis present

## 2022-03-14 DIAGNOSIS — Z170 Estrogen receptor positive status [ER+]: Secondary | ICD-10-CM

## 2022-03-14 DIAGNOSIS — W19XXXA Unspecified fall, initial encounter: Secondary | ICD-10-CM | POA: Diagnosis not present

## 2022-03-14 DIAGNOSIS — Z880 Allergy status to penicillin: Secondary | ICD-10-CM | POA: Diagnosis not present

## 2022-03-14 DIAGNOSIS — M79605 Pain in left leg: Secondary | ICD-10-CM | POA: Diagnosis not present

## 2022-03-14 DIAGNOSIS — R300 Dysuria: Secondary | ICD-10-CM | POA: Diagnosis not present

## 2022-03-14 DIAGNOSIS — M7989 Other specified soft tissue disorders: Secondary | ICD-10-CM | POA: Diagnosis not present

## 2022-03-14 DIAGNOSIS — R2681 Unsteadiness on feet: Secondary | ICD-10-CM | POA: Diagnosis not present

## 2022-03-14 DIAGNOSIS — Z7901 Long term (current) use of anticoagulants: Secondary | ICD-10-CM

## 2022-03-14 DIAGNOSIS — Z91040 Latex allergy status: Secondary | ICD-10-CM | POA: Diagnosis not present

## 2022-03-14 DIAGNOSIS — X500XXA Overexertion from strenuous movement or load, initial encounter: Secondary | ICD-10-CM | POA: Diagnosis not present

## 2022-03-14 DIAGNOSIS — I82409 Acute embolism and thrombosis of unspecified deep veins of unspecified lower extremity: Secondary | ICD-10-CM | POA: Diagnosis present

## 2022-03-14 DIAGNOSIS — Z882 Allergy status to sulfonamides status: Secondary | ICD-10-CM

## 2022-03-14 DIAGNOSIS — S72002A Fracture of unspecified part of neck of left femur, initial encounter for closed fracture: Secondary | ICD-10-CM

## 2022-03-14 DIAGNOSIS — M25552 Pain in left hip: Secondary | ICD-10-CM | POA: Diagnosis not present

## 2022-03-14 DIAGNOSIS — S72009A Fracture of unspecified part of neck of unspecified femur, initial encounter for closed fracture: Secondary | ICD-10-CM | POA: Diagnosis present

## 2022-03-14 DIAGNOSIS — I82542 Chronic embolism and thrombosis of left tibial vein: Secondary | ICD-10-CM | POA: Diagnosis present

## 2022-03-14 DIAGNOSIS — C50411 Malignant neoplasm of upper-outer quadrant of right female breast: Secondary | ICD-10-CM

## 2022-03-14 DIAGNOSIS — C7951 Secondary malignant neoplasm of bone: Secondary | ICD-10-CM | POA: Diagnosis present

## 2022-03-14 DIAGNOSIS — M9702XA Periprosthetic fracture around internal prosthetic left hip joint, initial encounter: Secondary | ICD-10-CM | POA: Diagnosis present

## 2022-03-14 DIAGNOSIS — Z884 Allergy status to anesthetic agent status: Secondary | ICD-10-CM | POA: Diagnosis not present

## 2022-03-14 DIAGNOSIS — G893 Neoplasm related pain (acute) (chronic): Secondary | ICD-10-CM | POA: Diagnosis not present

## 2022-03-14 DIAGNOSIS — Z888 Allergy status to other drugs, medicaments and biological substances status: Secondary | ICD-10-CM

## 2022-03-14 DIAGNOSIS — I890 Lymphedema, not elsewhere classified: Secondary | ICD-10-CM | POA: Diagnosis not present

## 2022-03-14 DIAGNOSIS — Z79899 Other long term (current) drug therapy: Secondary | ICD-10-CM

## 2022-03-14 DIAGNOSIS — Z79890 Hormone replacement therapy (postmenopausal): Secondary | ICD-10-CM

## 2022-03-14 DIAGNOSIS — K219 Gastro-esophageal reflux disease without esophagitis: Secondary | ICD-10-CM | POA: Diagnosis not present

## 2022-03-14 DIAGNOSIS — Z79818 Long term (current) use of other agents affecting estrogen receptors and estrogen levels: Secondary | ICD-10-CM | POA: Diagnosis not present

## 2022-03-14 DIAGNOSIS — Y9389 Activity, other specified: Secondary | ICD-10-CM | POA: Diagnosis not present

## 2022-03-14 DIAGNOSIS — R2689 Other abnormalities of gait and mobility: Secondary | ICD-10-CM | POA: Diagnosis not present

## 2022-03-14 DIAGNOSIS — Z853 Personal history of malignant neoplasm of breast: Secondary | ICD-10-CM

## 2022-03-14 DIAGNOSIS — E039 Hypothyroidism, unspecified: Secondary | ICD-10-CM | POA: Diagnosis present

## 2022-03-14 DIAGNOSIS — Z043 Encounter for examination and observation following other accident: Secondary | ICD-10-CM | POA: Diagnosis not present

## 2022-03-14 DIAGNOSIS — Z96642 Presence of left artificial hip joint: Secondary | ICD-10-CM | POA: Diagnosis not present

## 2022-03-14 DIAGNOSIS — S79912A Unspecified injury of left hip, initial encounter: Secondary | ICD-10-CM | POA: Diagnosis not present

## 2022-03-14 DIAGNOSIS — I10 Essential (primary) hypertension: Secondary | ICD-10-CM | POA: Diagnosis not present

## 2022-03-14 DIAGNOSIS — Z7401 Bed confinement status: Secondary | ICD-10-CM | POA: Diagnosis not present

## 2022-03-14 DIAGNOSIS — S72142D Displaced intertrochanteric fracture of left femur, subsequent encounter for closed fracture with routine healing: Secondary | ICD-10-CM | POA: Diagnosis not present

## 2022-03-14 LAB — CBC WITH DIFFERENTIAL/PLATELET
Abs Immature Granulocytes: 0.03 10*3/uL (ref 0.00–0.07)
Basophils Absolute: 0.1 10*3/uL (ref 0.0–0.1)
Basophils Relative: 1 %
Eosinophils Absolute: 0.3 10*3/uL (ref 0.0–0.5)
Eosinophils Relative: 3 %
HCT: 40.2 % (ref 36.0–46.0)
Hemoglobin: 12.7 g/dL (ref 12.0–15.0)
Immature Granulocytes: 0 %
Lymphocytes Relative: 23 %
Lymphs Abs: 2.1 10*3/uL (ref 0.7–4.0)
MCH: 28.2 pg (ref 26.0–34.0)
MCHC: 31.6 g/dL (ref 30.0–36.0)
MCV: 89.1 fL (ref 80.0–100.0)
Monocytes Absolute: 1.4 10*3/uL — ABNORMAL HIGH (ref 0.1–1.0)
Monocytes Relative: 15 %
Neutro Abs: 5.1 10*3/uL (ref 1.7–7.7)
Neutrophils Relative %: 58 %
Platelets: 311 10*3/uL (ref 150–400)
RBC: 4.51 MIL/uL (ref 3.87–5.11)
RDW: 15.1 % (ref 11.5–15.5)
WBC: 9 10*3/uL (ref 4.0–10.5)
nRBC: 0 % (ref 0.0–0.2)

## 2022-03-14 LAB — COMPREHENSIVE METABOLIC PANEL
ALT: 12 U/L (ref 0–44)
AST: 15 U/L (ref 15–41)
Albumin: 3.7 g/dL (ref 3.5–5.0)
Alkaline Phosphatase: 105 U/L (ref 38–126)
Anion gap: 6 (ref 5–15)
BUN: 16 mg/dL (ref 8–23)
CO2: 27 mmol/L (ref 22–32)
Calcium: 10.5 mg/dL — ABNORMAL HIGH (ref 8.9–10.3)
Chloride: 107 mmol/L (ref 98–111)
Creatinine, Ser: 0.85 mg/dL (ref 0.44–1.00)
GFR, Estimated: >60 mL/min (ref >60)
Glucose, Bld: 103 mg/dL — ABNORMAL HIGH (ref 70–99)
Potassium: 3.8 mmol/L (ref 3.5–5.1)
Sodium: 140 mmol/L (ref 135–145)
Total Bilirubin: 0.5 mg/dL (ref 0.3–1.2)
Total Protein: 6.9 g/dL (ref 6.5–8.1)

## 2022-03-14 MED ORDER — HYDROMORPHONE HCL 2 MG/ML IJ SOLN
2.0000 mg | Freq: Once | INTRAMUSCULAR | Status: DC
  Filled 2022-03-14: qty 1

## 2022-03-14 MED ORDER — HYDROCODONE-ACETAMINOPHEN 5-325 MG PO TABS
1.0000 | ORAL_TABLET | Freq: Once | ORAL | Status: AC
  Administered 2022-03-14: 1 via ORAL
  Filled 2022-03-14: qty 1

## 2022-03-14 NOTE — ED Notes (Signed)
ED TO INPATIENT HANDOFF REPORT  Name/Age/Gender Alicia Vasquez 81 y.o. female  Code Status Code Status History     Date Active Date Inactive Code Status Order ID Comments User Context   01/05/2021 0210 01/12/2021 1851 Full Code [id number]  Rise Patience, MD ED   01/26/2017 1154 02/03/2017 1402 Full Code [id number]  Norman Herrlich Inpatient   01/15/2017 2353 01/21/2017 2124 Full Code [id number]  Norval Morton, MD ED   09/09/2013 1443 09/10/2013 1219 Full Code [id number]  [name], MD Inpatient       Home/SNF/Other Home  Chief Complaint Hip fracture [facility]) [S72.009A]  Level of Care/Admitting Diagnosis ED Disposition     ED Disposition  Admit   Condition  --   [facility] Area: [facility] [[id number]]  Level of Care: Med-Surg [16]  May admit patient to [facility] or [facility] if equivalent level of care is available:: Yes  Covid Evaluation: Asymptomatic - no recent exposure (last 10 days) testing not required  Diagnosis: Hip fracture 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   DVT (deep vein thrombosis) in pregnancy    Hypertension    Hypothyroid    Metastatic cancer to bone ([place]) dx'd 12/2016   hip   Personal history of radiation therapy    Pneumonia    PONV (postoperative nausea and vomiting)    Radiation 11/21/13-01/07/14   Right Breast/Supraclavicular    Allergies Allergies  Allergen Reactions   Anesthetics, Amide Hypertension   Benadryl [Diphenhydramine Hcl] Other (See Comments)    Dizziness   Carbocaine  [Mepivacaine Hcl] Hypertension   Codeine Other (See Comments)    Dizziness   Epinephrine Hypertension   Sulfa Antibiotics Other (See Comments)    dizziness   Diphenhydramine    Latex Other (See Comments)    Blisters in mouth   Penicillins Rash    Has patient had a PCN reaction causing immediate rash, facial/tongue/throat swelling, SOB or lightheadedness with hypotension: Unknown Has patient had a PCN reaction causing severe rash involving mucus membranes or skin necrosis: Unknown Has patient had a PCN reaction that required hospitalization: Unknown Has patient had a PCN reaction occurring within the last 10 years: Unknown If all of the above answers are "NO", then may proceed with Cephalosporin use.    Tramadol Other (See Comments)    Sedation.     IV Location/Drains/Wounds Patient Lines/Drains/Airways Status     Active Line/Drains/Airways     Name Placement date Placement time Site Days   Peripheral IV 03/14/22 20 G Left Antecubital 03/14/22  2134  Antecubital  less than 1   External Urinary Catheter 03/14/22  2210  --  less than 1            Labs/Imaging Results for orders placed or performed during the hospital encounter of 03/14/22 (from the past 48 hour(s))  Comprehensive metabolic panel     Status: Abnormal   Collection Time: 03/14/22  8:49 PM  Result Value Ref Range   Sodium 140 135 - 145 mmol/L  Potassium 3.8 3.5 - 5.1 mmol/L   Chloride 107 98 - 111 mmol/L   CO2 27 22 - 32 mmol/L   Glucose, Bld 103 (H) 70 - 99 mg/dL    Comment: Glucose reference range applies only to samples taken after fasting for at least 8 hours.   BUN 16 8 - 23 mg/dL   Creatinine, Ser 0.85 0.44 - 1.00 mg/dL   Calcium 10.5 (H) 8.9 - 10.3 mg/dL   Total Protein 6.9 6.5 - 8.1 g/dL   Albumin 3.7 3.5 - 5.0 g/dL   AST 15 15 - 41 U/L   ALT 12 0 - 44 U/L   Alkaline Phosphatase 105 38 - 126 U/L   Total Bilirubin 0.5 0.3 - 1.2 mg/dL   GFR, Estimated >60 >60 mL/min    Comment:  (NOTE) Calculated using the CKD-EPI Creatinine Equation (2021)    Anion gap 6 5 - 15    Comment: Performed at [facility], [place] [address] [place], [place] [address]  CBC with Differential/Platelet     Status: Abnormal   Collection Time: 03/14/22  8:49 PM  Result Value Ref Range   WBC 9.0 4.0 - 10.5 K/uL   RBC 4.51 3.87 - 5.11 MIL/uL   Hemoglobin 12.7 12.0 - 15.0 g/dL   HCT 40.2 36.0 - 46.0 %   MCV 89.1 80.0 - 100.0 fL   MCH 28.2 26.0 - 34.0 pg   MCHC 31.6 30.0 - 36.0 g/dL   RDW 15.1 11.5 - 15.5 %   Platelets 311 150 - 400 K/uL   nRBC 0.0 0.0 - 0.2 %   Neutrophils Relative % 58 %   Neutro Abs 5.1 1.7 - 7.7 K/uL   Lymphocytes Relative 23 %   Lymphs Abs 2.1 0.7 - 4.0 K/uL   Monocytes Relative 15 %   Monocytes Absolute 1.4 (H) 0.1 - 1.0 K/uL   Eosinophils Relative 3 %   Eosinophils Absolute 0.3 0.0 - 0.5 K/uL   Basophils Relative 1 %   Basophils Absolute 0.1 0.0 - 0.1 K/uL   Immature Granulocytes 0 %   Abs Immature Granulocytes 0.03 0.00 - 0.07 K/uL    Comment: Performed at [facility], [place] [address] [place], [place] [address]   *Note: Due to a large number of results and/or encounters for the requested time period, some results have not been displayed. A complete set of results can be found in Results Review.   DG Chest 1 View  Result Date: 03/14/2022 CLINICAL DATA:  Fall, preop EXAM: CHEST  1 VIEW COMPARISON:  05/19/2021 FINDINGS: Heart and mediastinal contours are within normal limits. No focal opacities or effusions. No acute bony abnormality. IMPRESSION: No active cardiopulmonary disease. Electronically Signed   By: Rolm Baptise M.D.   On: 03/14/2022 21:11   CT Hip Left Wo Contrast  Result Date: 03/14/2022 CLINICAL DATA:  Hip trauma. Fracture suspected. Fell 5 days ago. Left hip and leg pain. EXAM: CT OF THE LEFT HIP WITHOUT CONTRAST TECHNIQUE: Multidetector CT imaging of the left hip was performed according to the standard  protocol. Multiplanar CT image reconstructions were also generated. RADIATION DOSE REDUCTION: This exam was performed according to the departmental dose-optimization program which includes automated exposure control, adjustment of the mA and/or kV according to patient size and/or use of iterative reconstruction technique. COMPARISON:  Pelvis and left hip radiographs 03/14/2022 and 08/18/2021; CT chest, abdomen, and pelvis 07/28/2021 FINDINGS: Bones/Joint/Cartilage Postsurgical changes are again seen of total left hip  arthroplasty. There is again associated streak artifact that limits evaluation of adjacent bony detail. The distal tip of the femoral stem is not imaged. There are again proximal left femoral diaphyseal cerclage wires. Within the limitations of diffuse decreased bone mineralization, there appears to be new oblique linear lucency within the anterior distal intertrochanteric region suspicious for an acute fracture (sagittal series 7 images 80 through 85, coronal series 6 images 89 through 94). High-grade heterotopic bone formation is again seen superior to the greater trochanter and superior to the femoroacetabular joint. Mild-to-moderate pubic symphysis joint space narrowing and peripheral osteophytosis. Ligaments Suboptimally assessed by CT. Muscles and Tendons Chronic, unchanged moderate fatty infiltration of the left gluteus minimus and medius muscles. Soft tissues No definite hematoma is seen, within limitations of diffuse streak artifact around the hip prosthesis hardware. Mild-to-moderate distal descending colon and sigmoid colon diverticulosis. IMPRESSION: 1. Redemonstration of total left hip arthroplasty. The previously seen proximal femoral diaphyseal cerclage wires are partially imaged. 2. There is new linear lucency indicating an acute to subacute fracture within the anterior distal intertrochanteric region of the proximal left femur, extending within 5 mm of the proximal femoral stem  hardware. Electronically Signed   By: Yvonne Kendall M.D.   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 Similar lucency about the intratrochanteric femoral prosthetic hardware. Diffuse demineralization more severe demineralization of the left proximal femur. No definite displaced fracture. No evidence of dislocation. 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 No definite displaced fracture. Demineralization of the left femur limits evaluation for nondisplaced fracture, if there is continued clinical concern recommend further evaluation with cross-sectional imaging. Electronically Signed   By: Yetta Glassman M.D.   On: 03/14/2022 18:23   DG Knee Complete 4 Views Left  Result Date: 03/14/2022 CLINICAL DATA:  Fall. EXAM: LEFT KNEE - COMPLETE 4+ VIEW COMPARISON:  None Available. FINDINGS: The bones are diffusely osteopenic. There is no fracture, dislocation or joint effusion. AP and lateral views are limited secondary to patient positioning. There is moderate tricompartmental degenerative change with osteophyte formation and joint space narrowing. Orthopedic hardware is partially visualized in the mid femur. IMPRESSION: 1. No acute fracture or dislocation. 2. Osteopenia. 3. Moderate tricompartmental degenerative change. Electronically Signed   By: Ronney Asters M.D.   On: 03/14/2022 18:19   DG Ankle Complete Left  Result Date: 03/14/2022 CLINICAL DATA:  Fall EXAM: LEFT ANKLE COMPLETE - 3+ VIEW COMPARISON:  None Available. FINDINGS: The bones are osteopenic. There is no evidence of fracture, dislocation, or joint effusion. There is no evidence of  arthropathy or other focal bone abnormality. There is some soft tissue swelling surrounding the ankle. There is some nonspecific skin calcifications of the posterior ankle. IMPRESSION: 1. Osteopenia. Soft tissue swelling. No evidence of fracture or dislocation. Electronically Signed   By: Ronney Asters M.D.   On: 03/14/2022 18:17    Pending Labs Unresulted Labs (From admission, onward)     Start     Ordered   03/14/22 2049  Urinalysis, Routine w reflex microscopic  Once,   URGENT        03/14/22 2048  Vitals/Pain Today's Vitals   03/14/22 1843 03/14/22 2000 03/14/22 2130 03/14/22 2230  BP:  (!) 178/84 (!) 170/77 (!) 156/61  Pulse:  81 86 86  Resp:  '18 18 16  '$ Temp:  97.6 F (36.4 C)    TempSrc:  Oral    SpO2:  93% 95% 95%  PainSc: 8        Isolation Precautions No active isolations  Medications Medications  HYDROmorphone (DILAUDID) injection 2 mg (2 mg Intramuscular Patient Refused/Not Given 03/14/22 1731)  HYDROcodone-acetaminophen (NORCO/VICODIN) 5-325 MG per tablet 1 tablet (1 tablet Oral Given 03/14/22 1744)    Mobility non-ambulatory

## 2022-03-14 NOTE — Progress Notes (Signed)
Attempted doing pt's nursing admission history. She is HOH and can't hear. She is out in the hall. I explained to her that the nursing admission hx will be completed when she gets upstairs or maybe in ER if they move her out of the hallway into a room. Unable to maintain pt privacy to do nursing admission history in the hallway at this time.Pt verbalizes understanding. Doroteo Bradford BSN, RN-BC Throughput Nurse 03/14/2022 9:17 PM

## 2022-03-14 NOTE — H&P (Signed)
History and Physical    Alicia Vasquez:[id number] DOB: [birth year] DOA: 03/14/2022  PCP: [name], MD  Patient coming from: Home  Chief Complaint: Left hip pain  HPI: Alicia Vasquez is a 81 y.o. 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  Hypertensive on arrival to the ED, remainder of vital signs stable.  Labs including CBC and CMP without any significant abnormalities except calcium chronically borderline elevated in the setting of malignancy.  CT of left hip showing an acute to subacute fracture within the anterior distal intertrochanteric region of the proximal left femur extending within 5 mm of the proximal femoral stem hardware.  X-rays of left ankle and knee negative for fracture or dislocation.  Chest x-ray negative for acute finding. Patient was given Norco for pain.  ED physician discussed the case with orthopedics ([name]), recommended admission and they will reevaluate the hip in the morning and may need additional imaging modalities done.  No plan for surgery in the morning at this time.  [facility] called to admit.  Patient states she fell during her appointment at oncology office 5 days ago.  States at the beginning of the appointment as she was trying to step off of the weighing scale, she did not have anything to hold onto to maintain balance and fell causing her left hip to hit a chair.  She denies head injury or loss of consciousness.  She has not been able to walk since after this injury but states even at baseline she does not walk and has to use a wheelchair.  She is endorsing 5 out of 10 intensity pain in her left hip/groin region.  She is  endorsing dysuria.  Denies fevers, cough, shortness of breath, chest pain, vomiting, or abdominal pain.  Review of Systems:  Review of Systems  All other systems reviewed and are negative.   Past Medical History:  Diagnosis Date   Allergy    Anxiety    Arthritis    Blood transfusion without reported diagnosis    Breast cancer ([place]) 07/12/2013   Invasive Mammary Carcinoma   DVT (deep vein thrombosis) in pregnancy    Hypertension    Hypothyroid    Metastatic cancer to bone ([place]) dx'd 12/2016   hip   Personal history of radiation therapy    Pneumonia    PONV (postoperative nausea and vomiting)    Radiation 11/21/13-01/07/14   Right Breast/Supraclavicular    Past Surgical History:  Procedure Laterality Date   BREAST LUMPECTOMY Left 2015   BREAST LUMPECTOMY WITH RADIOACTIVE SEED LOCALIZATION Right 09/09/2013   Procedure: BREAST LUMPECTOMY WITH RADIOACTIVE SEED LOCALIZATION WITH AXILLARY NODE EXCISION;  Surgeon: [name], MD;  Location: [facility];  Service: General;  Laterality: Right;   CYSTOSCOPY W/ URETERAL STENT PLACEMENT Right 01/07/2021   Procedure: CYSTOSCOPY WITH RETROGRADE PYELOGRAM/URETERAL STENT PLACEMENT;  Surgeon: Bjorn Loser, MD;  Location: [facility];  Service: Urology;  Laterality: Right;   CYSTOSCOPY/URETEROSCOPY/HOLMIUM LASER/STENT PLACEMENT Right 02/08/2021   Procedure: CYSTOSCOPY, RIGHT URETEROSCOPY, RIGHT RETRGRADE PYELOGRAM, HOLMIUM LASER/STENT PLACEMENT;  Surgeon: Lucas Mallow, MD;  Location: [facility];  Service: Urology;  Laterality: Right;   DENTAL SURGERY  04/19/2012   13  TEETH REMOVED   DILATION AND CURETTAGE OF UTERUS     IR RADIOLOGIST EVAL & MGMT  01/21/2021   ORIF PERIPROSTHETIC FRACTURE Left 01/31/2017   Procedure: REVISION and OPEN REDUCTION INTERNAL FIXATION (ORIF) PERIPROSTHETIC FRACTURE LEFT HIP;  Surgeon: Paralee Cancel, MD;  Location: [facility];  Service: Orthopedics;  Laterality: Left;  120 mins   RE-EXCISION OF BREAST  LUMPECTOMY Right 09/24/2013   Procedure: RE-EXCISION OF RIGHT BREAST LUMPECTOMY;  Surgeon: [name], MD;  Location: [facility];  Service: General;  Laterality: Right;   TOTAL HIP ARTHROPLASTY Left 01/16/2017   Procedure: TOTAL HIP ARTHROPLASTY POSTERIOR;  Surgeon: Paralee Cancel, MD;  Location: [facility];  Service: Orthopedics;  Laterality: Left;     reports that she has never smoked. She has never used smokeless tobacco. She reports that she does not drink alcohol and does not use drugs.  Allergies  Allergen Reactions   Anesthetics, Amide Hypertension   Benadryl [Diphenhydramine Hcl] Other (See Comments)    Dizziness   Carbocaine [Mepivacaine Hcl] Hypertension   Codeine Other (See Comments)    Dizziness   Epinephrine Hypertension   Sulfa Antibiotics Other (See Comments)    dizziness   Diphenhydramine    Latex Other (See Comments)    Blisters in mouth   Penicillins Rash    Has patient had a PCN reaction causing immediate rash, facial/tongue/throat swelling, SOB or lightheadedness with hypotension: Unknown Has patient had a PCN reaction causing severe rash involving mucus membranes or skin necrosis: Unknown Has patient had a PCN reaction that required hospitalization: Unknown Has patient had a PCN reaction occurring within the last 10 years: Unknown If all of the above answers are "NO", then may proceed with Cephalosporin use.    Tramadol Other (See Comments)    Sedation.     Family History  Problem Relation Age of Onset   Heart disease Brother    Colon cancer Brother    Prostate cancer Brother     Prior to Admission medications   Medication Sig Start Date End Date Taking? Authorizing Provider  Cholecalciferol (VITAMIN D3) 5000 UNITS TABS Take 5,000 Units by mouth daily.    Yes [provider]  HYDROcodone-acetaminophen (NORCO/VICODIN) 5-325 MG tablet Take 1 tablet by mouth every 6 (six) hours as needed for moderate pain. 03/11/22  Yes Benay Pike, MD  levothyroxine (SYNTHROID) 125 MCG tablet Take 125 mcg by mouth daily before breakfast. 01/31/20  Yes [provider]  warfarin (COUMADIN) 2.5 MG tablet Take 7.'5mg'$  daily Patient taking differently: Take 2.5 mg by mouth as directed. Take along with 5 mg tablet=7.5 mg 03/03/22  Yes Iruku, Arletha Pili, MD  warfarin (COUMADIN) 5 MG tablet Take 7.'5mg'$  daily Patient taking differently: Take 5 mg by mouth as directed. Take along with 2.5 mg tablet=7.5 mg 12/14/21  Yes Causey, Charlestine Massed, NP  azelastine (ASTELIN) 0.1 % nasal spray Place 2 sprays into both nostrils 2 (two) times daily. Patient not taking: Reported on 03/14/2022 01/27/22   [name], MD  doxycycline (VIBRA-TABS) 100 MG tablet Take 1 tablet (100 mg total) by mouth 2 (two) times daily. Patient not taking: Reported on 03/14/2022 01/27/22   [name], MD  ketoconazole (NIZORAL) 2 % cream Apply 1 Application topically daily. Patient not taking: Reported on 03/14/2022 12/07/21   Dede Query T, PA-C  lisinopril (PRINIVIL) 10 MG tablet Take 1 tablet (10 mg total) by mouth daily. 03/09/22   Benay Pike, MD  ondansetron (ZOFRAN) 8 MG tablet  Take 1 tablet (8 mg total) by mouth every 8 (eight) hours as needed for nausea or vomiting. Patient not taking: Reported on 03/14/2022 12/07/21   Dede Query T, PA-C  silver sulfADIAZINE (SILVADENE) 1 % cream Apply a thin layer under affected breast once a day Patient not taking: Reported on 03/14/2022 02/04/22   Gardenia Phlegm, NP    Physical Exam: Vitals:   03/14/22 1656 03/14/22 2000 03/14/22 2130 03/14/22 2230  BP: 123/82 (!) 178/84 (!) 170/77 (!) 156/61  Pulse: 87 81 86 86  Resp: '18 18 18 16  '$ Temp:  97.6 F (36.4 C)    TempSrc:  Oral    SpO2: 92% 93% 95% 95%    Physical Exam Vitals reviewed.  Constitutional:      General: She is not in acute distress. HENT:     Head: Normocephalic and atraumatic.  Eyes:     Extraocular Movements: Extraocular  movements intact.  Cardiovascular:     Rate and Rhythm: Normal rate and regular rhythm.     Pulses: Normal pulses.  Pulmonary:     Effort: Pulmonary effort is normal. No respiratory distress.     Breath sounds: Normal breath sounds. No wheezing or rales.  Abdominal:     General: Bowel sounds are normal. There is no distension.     Palpations: Abdomen is soft.     Tenderness: There is no abdominal tenderness.  Musculoskeletal:     Cervical back: Normal range of motion.     Right lower leg: No edema.     Left lower leg: No edema.  Skin:    General: Skin is warm and dry.  Neurological:     General: No focal deficit present.     Mental Status: She is alert and oriented to person, place, and time.     Labs on Admission: I have personally reviewed following labs and imaging studies  CBC: Recent Labs  Lab 03/09/22 1133 03/14/22 2049  WBC 8.6 9.0  NEUTROABS 5.7 5.1  HGB 13.0 12.7  HCT 39.8 40.2  MCV 86.9 89.1  PLT 287 458   Basic Metabolic Panel: Recent Labs  Lab 03/09/22 1133 03/14/22 2049  NA 141 140  K 4.1 3.8  CL 106 107  CO2 29 27  GLUCOSE 103* 103*  BUN 16 16  CREATININE 0.79 0.85  CALCIUM 10.5* 10.5*   GFR: Estimated Creatinine Clearance: 58.9 mL/min (by C-G formula based on SCr of 0.85 mg/dL). Liver Function Tests: Recent Labs  Lab 03/09/22 1133 03/14/22 2049  AST 12* 15  ALT 10 12  ALKPHOS 125 105  BILITOT 0.5 0.5  PROT 7.2 6.9  ALBUMIN 4.0 3.7   No results for input(s): "LIPASE", "AMYLASE" in the last 168 hours. No results for input(s): "AMMONIA" in the last 168 hours. Coagulation Profile: Recent Labs  Lab 03/09/22 1133  INR 1.4*   Cardiac Enzymes: No results for input(s): "CKTOTAL", "CKMB", "CKMBINDEX", "TROPONINI" in the last 168 hours. BNP (last 3 results) No results for input(s): "PROBNP" in the last 8760 hours. HbA1C: No results for input(s): "HGBA1C" in the last 72 hours. CBG: No results for input(s): "GLUCAP" in the last 168  hours. Lipid Profile: No results for input(s): "CHOL", "HDL", "LDLCALC", "TRIG", "CHOLHDL", "LDLDIRECT" in the last 72 hours. Thyroid Function Tests: No results for input(s): "TSH", "T4TOTAL", "FREET4", "T3FREE", "THYROIDAB" in the last 72 hours. Anemia Panel: No results for input(s): "VITAMINB12", "FOLATE", "FERRITIN", "TIBC", "IRON", "RETICCTPCT" in the last 72 hours. Urine analysis:  Component Value Date/Time   COLORURINE STRAW (A) 02/04/2022 [place] 02/04/2022 1244   LABSPEC 1.010 02/04/2022 1244   LABSPEC 1.010 09/22/2016 1553   PHURINE 7.0 02/04/2022 [place] 02/04/2022 1244   GLUCOSEU Negative 09/22/2016 [place] 02/04/2022 [place] 02/04/2022 1244   BILIRUBINUR negative 10/26/2021 1433   BILIRUBINUR Negative 09/22/2016 1553   KETONESUR NEGATIVE 02/04/2022 1244   PROTEINUR NEGATIVE 02/04/2022 1244   UROBILINOGEN 0.2 10/26/2021 1433   UROBILINOGEN 0.2 11/15/2016 1358   UROBILINOGEN 0.2 09/22/2016 1553   NITRITE NEGATIVE 02/04/2022 1244   LEUKOCYTESUR NEGATIVE 02/04/2022 1244   LEUKOCYTESUR Negative 09/22/2016 1553    Radiological Exams on Admission: DG Chest 1 View  Result Date: 03/14/2022 CLINICAL DATA:  Fall, preop EXAM: CHEST  1 VIEW COMPARISON:  05/19/2021 FINDINGS: Heart and mediastinal contours are within normal limits. No focal opacities or effusions. No acute bony abnormality. IMPRESSION: No active cardiopulmonary disease. Electronically Signed   By: Rolm Baptise M.D.   On: 03/14/2022 21:11   CT Hip Left Wo Contrast  Result Date: 03/14/2022 CLINICAL DATA:  Hip trauma. Fracture suspected. Fell 5 days ago. Left hip and leg pain. EXAM: CT OF THE LEFT HIP WITHOUT CONTRAST TECHNIQUE: Multidetector CT imaging of the left hip was performed according to the standard protocol. Multiplanar CT image reconstructions were also generated. RADIATION DOSE REDUCTION: This exam was performed according to the  departmental dose-optimization program which includes automated exposure control, adjustment of the mA and/or kV according to patient size and/or use of iterative reconstruction technique. COMPARISON:  Pelvis and left hip radiographs 03/14/2022 and 08/18/2021; CT chest, abdomen, and pelvis 07/28/2021 FINDINGS: Bones/Joint/Cartilage Postsurgical changes are again seen of total left hip arthroplasty. There is again associated streak artifact that limits evaluation of adjacent bony detail. The distal tip of the femoral stem is not imaged. There are again proximal left femoral diaphyseal cerclage wires. Within the limitations of diffuse decreased bone mineralization, there appears to be new oblique linear lucency within the anterior distal intertrochanteric region suspicious for an acute fracture (sagittal series 7 images 80 through 85, coronal series 6 images 89 through 94). High-grade heterotopic bone formation is again seen superior to the greater trochanter and superior to the femoroacetabular joint. Mild-to-moderate pubic symphysis joint space narrowing and peripheral osteophytosis. Ligaments Suboptimally assessed by CT. Muscles and Tendons Chronic, unchanged moderate fatty infiltration of the left gluteus minimus and medius muscles. Soft tissues No definite hematoma is seen, within limitations of diffuse streak artifact around the hip prosthesis hardware. Mild-to-moderate distal descending colon and sigmoid colon diverticulosis. IMPRESSION: 1. Redemonstration of total left hip arthroplasty. The previously seen proximal femoral diaphyseal cerclage wires are partially imaged. 2. There is new linear lucency indicating an acute to subacute fracture within the anterior distal intertrochanteric region of the proximal left femur, extending within 5 mm of the proximal femoral stem hardware. Electronically Signed   By: Yvonne Kendall M.D.   On: 03/14/2022 20:02   DG Hip Unilat W or Wo Pelvis 2-3 Views Left  Result Date:  03/14/2022 CLINICAL DATA:  Fall with left hip pain EXAM: DG HIP (WITH OR WITHOUT PELVIS) 3V LEFT COMPARISON:  Hip radiograph dated August 18, 2021 FINDINGS: Prior total left hip arthroplasty with heterotopic ossification, unchanged in appearance when compared with prior hip radiograph. Similar lucency about the intratrochanteric femoral prosthetic hardware. Diffuse demineralization more severe demineralization of the left proximal femur. No definite displaced fracture. No evidence  of dislocation. 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 No definite displaced fracture. Demineralization of the left femur limits evaluation for nondisplaced fracture, if there is continued clinical concern recommend further evaluation with cross-sectional imaging. Electronically Signed   By: Yetta Glassman M.D.   On: 03/14/2022 18:23   DG Knee Complete 4 Views Left  Result Date: 03/14/2022 CLINICAL DATA:  Fall. EXAM: LEFT KNEE - COMPLETE 4+ VIEW COMPARISON:  None Available. FINDINGS: The bones are diffusely osteopenic. There is no fracture, dislocation or joint effusion. AP and lateral views are limited secondary to patient positioning. There is moderate tricompartmental degenerative change with osteophyte formation and joint space narrowing. Orthopedic hardware is partially visualized in the mid femur. IMPRESSION: 1. No acute fracture or dislocation. 2. Osteopenia. 3. Moderate tricompartmental degenerative change. Electronically Signed   By: Ronney Asters M.D.   On: 03/14/2022 18:19   DG Ankle Complete Left  Result Date: 03/14/2022 CLINICAL DATA:  Fall EXAM: LEFT ANKLE COMPLETE - 3+ VIEW COMPARISON:  None Available. FINDINGS: The bones are osteopenic. There is no evidence of fracture, dislocation, or joint effusion. There is no evidence of arthropathy or other focal bone abnormality. There is some soft tissue swelling surrounding the ankle. There is some nonspecific skin calcifications of  the posterior ankle. IMPRESSION: 1. Osteopenia. Soft tissue swelling. No evidence of fracture or dislocation. Electronically Signed   By: Ronney Asters M.D.   On: 03/14/2022 18:17    EKG: Independently reviewed.  Sinus rhythm, no significant change since prior tracing.  Assessment and Plan  Left hip fracture In the setting of recent fall/injury 5 days ago.  CT of left hip showing an acute to subacute fracture within the anterior distal intertrochanteric region of the proximal left femur extending within 5 mm of the proximal femoral stem hardware. ED physician discussed the case with [name], recommended admission and they will reevaluate the hip in the morning and may need additional imaging modalities done.  No plan for surgery in the morning at this time.  -Patient is very hungry and wants to eat.  I have ordered diet for now. -She is on Coumadin due to history of DVT, hold at this time until patient is evaluated by orthopedics. -Continue pain management -Nonweightbearing  Dysuria -UA ordered  Stage IV breast cancer with bone mets Currently on fulvestrant injection every 4 weeks. -Outpatient oncology follow-up  History of left lower extremity DVT in 2014 She is on indefinite anticoagulation due to sedentary status/high risk of DVT. -Monitor INR -Hold Coumadin until patient is seen by orthopedics  Hypertension Blood pressure elevated with systolic currently in the 180s. -Pain management -Continue lisinopril -IV hydralazine PRN  Hypothyroidism -Continue Synthroid  DVT prophylaxis: Hold Coumadin until patient is seen by orthopedics. Code Status: Full Code (discussed with the patient) Family Communication: No family available at this time. Consults called: Orthopedics Level of care: Med-Surg Admission status: It is my clinical opinion that admission to INPATIENT is reasonable and necessary because of the expectation that this patient will require hospital care that crosses at  least 2 midnights to treat this condition based on the medical complexity of the problems presented.  Given the aforementioned information, the predictability of an adverse outcome is felt to be significant.   Shela Leff MD [facility]  If 7PM-7AM, please contact night-coverage [email or web address]  03/14/2022, 11:57 PM

## 2022-03-14 NOTE — H&P (Incomplete)
History and Physical    Alicia Vasquez ZOX:[id number] DOB: 1941-05-27 DOA: 03/14/2022  PCP: [name], MD  Patient coming from: {Blank single:19197::"Home","SNF","ALF/ILF","Group Home","BHH","CIR","Hospice","Homeless","[facility] ED","[facility] ED","Outside Hospital","***"}  Chief Complaint: ***  HPI: Alicia Vasquez is a 81 y.o. 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 Allergy   . Anxiety   . Arthritis   . Blood transfusion without reported diagnosis   . Breast cancer ([place]) 07/12/2013   Invasive Mammary Carcinoma  . DVT (deep vein thrombosis) in pregnancy   . Hypertension   . Hypothyroid   . Metastatic cancer to bone ([place]) dx'd 12/2016   hip  . Personal history of radiation therapy   . Pneumonia   . PONV (postoperative nausea and vomiting)   . Radiation 11/21/13-01/07/14   Right Breast/Supraclavicular    Past Surgical History:  Procedure Laterality Date  . BREAST LUMPECTOMY Left 2015  . BREAST LUMPECTOMY WITH RADIOACTIVE SEED LOCALIZATION Right 09/09/2013   Procedure: BREAST LUMPECTOMY WITH RADIOACTIVE SEED LOCALIZATION WITH AXILLARY NODE EXCISION;  Surgeon: [name], MD;  Location: [facility];  Service: General;  Laterality: Right;  . CYSTOSCOPY W/ URETERAL STENT PLACEMENT Right 01/07/2021   Procedure: CYSTOSCOPY WITH RETROGRADE PYELOGRAM/URETERAL STENT PLACEMENT;  Surgeon: Bjorn Loser, MD;  Location: [facility];  Service: Urology;  Laterality: Right;  . CYSTOSCOPY/URETEROSCOPY/HOLMIUM LASER/STENT PLACEMENT Right 02/08/2021   Procedure: CYSTOSCOPY, RIGHT URETEROSCOPY, RIGHT RETRGRADE PYELOGRAM, HOLMIUM LASER/STENT PLACEMENT;  Surgeon: Lucas Mallow, MD;  Location: [facility];  Service: Urology;  Laterality: Right;  . DENTAL  SURGERY  04/19/2012   13 TEETH REMOVED  . DILATION AND CURETTAGE OF UTERUS    . IR RADIOLOGIST EVAL & MGMT  01/21/2021  . ORIF PERIPROSTHETIC FRACTURE Left 01/31/2017   Procedure: REVISION and OPEN REDUCTION INTERNAL FIXATION (ORIF) PERIPROSTHETIC FRACTURE LEFT HIP;  Surgeon: Paralee Cancel, MD;  Location: [facility];  Service: Orthopedics;  Laterality: Left;  120 mins  . RE-EXCISION OF BREAST LUMPECTOMY Right 09/24/2013   Procedure: RE-EXCISION OF RIGHT BREAST LUMPECTOMY;  Surgeon: [name], MD;  Location: [facility] [facility];  Service: General;  Laterality: Right;  . TOTAL HIP ARTHROPLASTY Left 01/16/2017   Procedure: TOTAL HIP ARTHROPLASTY POSTERIOR;  Surgeon: Paralee Cancel, MD;  Location: [facility];  Service: Orthopedics;  Laterality: Left;     reports that she has never smoked. She has never used smokeless tobacco. She reports that she does not drink alcohol and does not use drugs.  Allergies  Allergen Reactions  . Anesthetics, Amide Hypertension  . Benadryl [Diphenhydramine Hcl] Other (See Comments)    Dizziness  . Carbocaine [Mepivacaine Hcl] Hypertension  . Codeine Other (See Comments)    Dizziness  . Epinephrine Hypertension  . Sulfa Antibiotics Other (See Comments)    dizziness  . Diphenhydramine   . Latex Other (See Comments)    Blisters in mouth  . Penicillins Rash    Has patient had a PCN reaction causing immediate rash, facial/tongue/throat swelling, SOB or lightheadedness with hypotension: Unknown Has patient had a PCN reaction causing severe rash involving mucus membranes or skin necrosis: Unknown Has patient had a PCN reaction that required hospitalization: Unknown Has patient had a PCN reaction occurring within the last 10 years: Unknown If all of the above answers are "NO", then may  proceed with Cephalosporin use.   . Tramadol Other (See Comments)    Sedation.     Family History  Problem Relation Age of Onset  . Heart disease Brother   . Colon cancer  Brother   . Prostate cancer Brother     Prior to Admission medications   Medication Sig Start Date End Date Taking? Authorizing Provider  Cholecalciferol (VITAMIN D3) 5000 UNITS TABS Take 5,000 Units by mouth daily.    Yes [provider]  HYDROcodone-acetaminophen (NORCO/VICODIN) 5-325 MG tablet Take 1 tablet by mouth every 6 (six) hours as needed for moderate pain. 03/11/22  Yes Benay Pike, MD  levothyroxine (SYNTHROID) 125 MCG tablet Take 125 mcg by mouth daily before breakfast. 01/31/20  Yes [provider]  warfarin (COUMADIN) 2.5 MG tablet Take 7.'5mg'$  daily Patient taking differently: Take 2.5 mg by mouth as directed. Take along with 5 mg tablet=7.5 mg 03/03/22  Yes Iruku, Arletha Pili, MD  warfarin (COUMADIN) 5 MG tablet Take 7.'5mg'$  daily Patient taking differently: Take 5 mg by mouth as directed. Take along with 2.5 mg tablet=7.5 mg 12/14/21  Yes Causey, Charlestine Massed, NP  azelastine (ASTELIN) 0.1 % nasal spray Place 2 sprays into both nostrils 2 (two) times daily. Patient not taking: Reported on 03/14/2022 01/27/22   [name], MD  doxycycline (VIBRA-TABS) 100 MG tablet Take 1 tablet (100 mg total) by mouth 2 (two) times daily. Patient not taking: Reported on 03/14/2022 01/27/22   [name], MD  ketoconazole (NIZORAL) 2 % cream Apply 1 Application topically daily. Patient not taking: Reported on 03/14/2022 12/07/21   Dede Query T, PA-C  lisinopril (PRINIVIL) 10 MG tablet Take 1 tablet (10 mg total) by mouth daily. 03/09/22   Benay Pike, MD  ondansetron (ZOFRAN) 8 MG tablet Take 1 tablet (8 mg total) by mouth every 8 (eight) hours as needed for nausea or vomiting. Patient not taking: Reported on 03/14/2022 12/07/21   Dede Query T, PA-C  silver sulfADIAZINE (SILVADENE) 1 % cream Apply a thin layer under affected breast once a day Patient not taking: Reported on 03/14/2022 02/04/22   Gardenia Phlegm, NP    Physical Exam: Vitals:   03/14/22  1656 03/14/22 2000 03/14/22 2130 03/14/22 2230  BP: 123/82 (!) 178/84 (!) 170/77 (!) 156/61  Pulse: 87 81 86 86  Resp: '18 18 18 16  '$ Temp:  97.6 F (36.4 C)    TempSrc:  Oral    SpO2: 92% 93% 95% 95%    Physical Exam  Labs on Admission: I have personally reviewed following labs and imaging studies  CBC: Recent Labs  Lab 03/09/22 1133 03/14/22 2049  WBC 8.6 9.0  NEUTROABS 5.7 5.1  HGB 13.0 12.7  HCT 39.8 40.2  MCV 86.9 89.1  PLT 287 324   Basic Metabolic Panel: Recent Labs  Lab 03/09/22 1133 03/14/22 2049  NA 141 140  K 4.1 3.8  CL 106 107  CO2 29 27  GLUCOSE 103* 103*  BUN 16 16  CREATININE 0.79 0.85  CALCIUM 10.5* 10.5*   GFR: Estimated Creatinine Clearance: 58.9 mL/min (by C-G formula based on SCr of 0.85 mg/dL). Liver Function Tests: Recent Labs  Lab 03/09/22 1133 03/14/22 2049  AST 12* 15  ALT 10 12  ALKPHOS 125 105  BILITOT 0.5 0.5  PROT 7.2 6.9  ALBUMIN 4.0 3.7   No results for input(s): "LIPASE", "AMYLASE" in the last 168 hours. No results for input(s): "AMMONIA" in the last 168 hours. Coagulation  Profile: Recent Labs  Lab 03/09/22 1133  INR 1.4*   Cardiac Enzymes: No results for input(s): "CKTOTAL", "CKMB", "CKMBINDEX", "TROPONINI" in the last 168 hours. BNP (last 3 results) No results for input(s): "PROBNP" in the last 8760 hours. HbA1C: No results for input(s): "HGBA1C" in the last 72 hours. CBG: No results for input(s): "GLUCAP" in the last 168 hours. Lipid Profile: No results for input(s): "CHOL", "HDL", "LDLCALC", "TRIG", "CHOLHDL", "LDLDIRECT" in the last 72 hours. Thyroid Function Tests: No results for input(s): "TSH", "T4TOTAL", "FREET4", "T3FREE", "THYROIDAB" in the last 72 hours. Anemia Panel: No results for input(s): "VITAMINB12", "FOLATE", "FERRITIN", "TIBC", "IRON", "RETICCTPCT" in the last 72 hours. Urine analysis:    Component Value Date/Time   COLORURINE STRAW (A) 02/04/2022 [place] 02/04/2022  1244   LABSPEC 1.010 02/04/2022 1244   LABSPEC 1.010 09/22/2016 1553   PHURINE 7.0 02/04/2022 [place] 02/04/2022 1244   GLUCOSEU Negative 09/22/2016 1553   HGBUR NEGATIVE 02/04/2022 1244   BILIRUBINUR NEGATIVE 02/04/2022 1244   BILIRUBINUR negative 10/26/2021 1433   BILIRUBINUR Negative 09/22/2016 1553   KETONESUR NEGATIVE 02/04/2022 1244   PROTEINUR NEGATIVE 02/04/2022 1244   UROBILINOGEN 0.2 10/26/2021 1433   UROBILINOGEN 0.2 11/15/2016 1358   UROBILINOGEN 0.2 09/22/2016 1553   NITRITE NEGATIVE 02/04/2022 1244   LEUKOCYTESUR NEGATIVE 02/04/2022 1244   LEUKOCYTESUR Negative 09/22/2016 1553    Radiological Exams on Admission: DG Chest 1 View  Result Date: 03/14/2022 CLINICAL DATA:  Fall, preop EXAM: CHEST  1 VIEW COMPARISON:  05/19/2021 FINDINGS: Heart and mediastinal contours are within normal limits. No focal opacities or effusions. No acute bony abnormality. IMPRESSION: No active cardiopulmonary disease. Electronically Signed   By: Rolm Baptise M.D.   On: 03/14/2022 21:11   CT Hip Left Wo Contrast  Result Date: 03/14/2022 CLINICAL DATA:  Hip trauma. Fracture suspected. Fell 5 days ago. Left hip and leg pain. EXAM: CT OF THE LEFT HIP WITHOUT CONTRAST TECHNIQUE: Multidetector CT imaging of the left hip was performed according to the standard protocol. Multiplanar CT image reconstructions were also generated. RADIATION DOSE REDUCTION: This exam was performed according to the departmental dose-optimization program which includes automated exposure control, adjustment of the mA and/or kV according to patient size and/or use of iterative reconstruction technique. COMPARISON:  Pelvis and left hip radiographs 03/14/2022 and 08/18/2021; CT chest, abdomen, and pelvis 07/28/2021 FINDINGS: Bones/Joint/Cartilage Postsurgical changes are again seen of total left hip arthroplasty. There is again associated streak artifact that limits evaluation of adjacent bony detail. The distal  tip of the femoral stem is not imaged. There are again proximal left femoral diaphyseal cerclage wires. Within the limitations of diffuse decreased bone mineralization, there appears to be new oblique linear lucency within the anterior distal intertrochanteric region suspicious for an acute fracture (sagittal series 7 images 80 through 85, coronal series 6 images 89 through 94). High-grade heterotopic bone formation is again seen superior to the greater trochanter and superior to the femoroacetabular joint. Mild-to-moderate pubic symphysis joint space narrowing and peripheral osteophytosis. Ligaments Suboptimally assessed by CT. Muscles and Tendons Chronic, unchanged moderate fatty infiltration of the left gluteus minimus and medius muscles. Soft tissues No definite hematoma is seen, within limitations of diffuse streak artifact around the hip prosthesis hardware. Mild-to-moderate distal descending colon and sigmoid colon diverticulosis. IMPRESSION: 1. Redemonstration of total left hip arthroplasty. The previously seen proximal femoral diaphyseal cerclage wires are partially imaged. 2. There is new linear lucency indicating an acute to subacute  fracture within the anterior distal intertrochanteric region of the proximal left femur, extending within 5 mm of the proximal femoral stem hardware. Electronically Signed   By: Yvonne Kendall M.D.   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 Similar lucency about the intratrochanteric femoral prosthetic hardware. Diffuse demineralization more severe demineralization of the left proximal femur. No definite displaced fracture. No evidence of dislocation.  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 No definite displaced fracture. Demineralization of the left femur limits evaluation for nondisplaced fracture, if there is continued clinical concern recommend further evaluation with cross-sectional imaging. Electronically Signed   By: Yetta Glassman M.D.   On: 03/14/2022 18:23   DG Knee Complete 4 Views Left  Result Date: 03/14/2022 CLINICAL DATA:  Fall. EXAM: LEFT KNEE - COMPLETE 4+ VIEW COMPARISON:  None Available. FINDINGS: The bones are diffusely osteopenic. There is no fracture, dislocation or joint effusion. AP and lateral views are limited secondary to patient positioning. There is moderate tricompartmental degenerative change with osteophyte formation and joint space narrowing. Orthopedic hardware is partially visualized in the mid femur. IMPRESSION: 1. No acute fracture or dislocation. 2. Osteopenia. 3. Moderate tricompartmental degenerative change. Electronically Signed   By: Ronney Asters M.D.   On: 03/14/2022 18:19   DG Ankle Complete Left  Result Date: 03/14/2022 CLINICAL DATA:  Fall EXAM: LEFT ANKLE COMPLETE - 3+ VIEW COMPARISON:  None Available. FINDINGS: The bones are osteopenic. There is no evidence of fracture, dislocation, or joint effusion. There is no evidence of arthropathy or other focal bone abnormality. There is some soft tissue swelling surrounding the ankle. There is some nonspecific skin calcifications of the posterior ankle. IMPRESSION: 1. Osteopenia. Soft tissue swelling. No evidence of fracture or dislocation. Electronically Signed   By: Ronney Asters M.D.   On: 03/14/2022 18:17    EKG: Independently reviewed. ***  Assessment and Plan  Stage IV breast cancer with bone mets Currently on fulvestrant injection every 4 weeks.     DVT prophylaxis: {Blank single:19197::"Lovenox","SQ Heparin","IV heparin gtt","Xarelto","Eliquis","Coumadin","SCDs","***"} Code Status: {Blank  single:19197::"Full Code","DNR","DNR/DNI","Comfort Care","***"} Family Communication: ***  Consults called: ***  Level of care: {Blank single:19197::"Med-Surg","Telemetry bed","Progressive Care Unit","Step Down Unit"} Admission status: ***  Shela Leff MD [facility]  If 7PM-7AM, please contact night-coverage [email or web address]  03/14/2022, 11:57 PM

## 2022-03-14 NOTE — ED Notes (Signed)
Patient transported to CT 

## 2022-03-14 NOTE — ED Provider Notes (Signed)
[facility] DEPT Provider Note   CSN: [id number] Arrival date & time: 03/14/22  1527     History  Chief Complaint  Patient presents with   Hip Pain    Alicia Vasquez is a 81 y.o. female.  Patient followed by hematology oncology for metastatic breast cancer to bone.  Originally diagnosed with a lumpectomy in 2015.  Patient also had left total hip replacement by [name] in 2018.  Patient was at the oncology clinic last Wednesday and she kind of fell off the scale did not go to ground but fell into the wheelchair she had a twisting sensation in her hip at that time and has had pain since then very painful for her to ambulate on it.  Patient did not hit her head.  Only complaint of pain is to that left hip and some to the left knee and left ankle.  Most of the pain is the left hip area.  Past medical history significant for hypertension hypothyroidism history of deep vein thrombosis patient is on Coumadin.  Patient has metastatic cancer to bone.  And a history of the breast cancer initially diagnosed in 2015.  Patient never used tobacco products.       Home Medications Prior to Admission medications   Medication Sig Start Date End Date Taking? Authorizing Provider  Cholecalciferol (VITAMIN D3) 5000 UNITS TABS Take 5,000 Units by mouth daily.    Yes [provider]  HYDROcodone-acetaminophen (NORCO/VICODIN) 5-325 MG tablet Take 1 tablet by mouth every 6 (six) hours as needed for moderate pain. 03/11/22  Yes Benay Pike, MD  levothyroxine (SYNTHROID) 125 MCG tablet Take 125 mcg by mouth daily before breakfast. 01/31/20  Yes [provider]  warfarin (COUMADIN) 2.5 MG tablet Take 7.'5mg'$  daily 03/03/22  Yes Iruku, Arletha Pili, MD  warfarin (COUMADIN) 5 MG tablet Take 7.'5mg'$  daily 12/14/21  Yes Causey, Charlestine Massed, NP  azelastine (ASTELIN) 0.1 % nasal spray Place 2 sprays into both nostrils 2 (two) times daily. 01/27/22   [name],  MD  Cholecalciferol 125 MCG (5000 UT) capsule  01/12/21   [provider]  doxycycline (VIBRA-TABS) 100 MG tablet Take 1 tablet (100 mg total) by mouth 2 (two) times daily. 01/27/22   [name], MD  ketoconazole (NIZORAL) 2 % cream Apply 1 Application topically daily. 12/07/21   Dede Query T, PA-C  lisinopril (PRINIVIL) 10 MG tablet Take 1 tablet (10 mg total) by mouth daily. 03/09/22   Benay Pike, MD  ondansetron (ZOFRAN) 8 MG tablet Take 1 tablet (8 mg total) by mouth every 8 (eight) hours as needed for nausea or vomiting. 12/07/21   Dede Query T, PA-C  silver sulfADIAZINE (SILVADENE) 1 % cream Apply a thin layer under affected breast once a day 02/04/22   Gardenia Phlegm, NP      Allergies    Anesthetics, amide; Benadryl [diphenhydramine hcl]; Carbocaine [mepivacaine hcl]; Codeine; Epinephrine; Sulfa antibiotics; Diphenhydramine; Latex; Penicillins; and Tramadol    Review of Systems   Review of Systems  Constitutional:  Negative for chills and fever.  HENT:  Negative for ear pain and sore throat.   Eyes:  Negative for pain and visual disturbance.  Respiratory:  Negative for cough and shortness of breath.   Cardiovascular:  Negative for chest pain and palpitations.  Gastrointestinal:  Negative for abdominal pain and vomiting.  Genitourinary:  Negative for dysuria and hematuria.  Musculoskeletal:  Negative for arthralgias and back pain.  Skin:  Negative for color change and rash.  Neurological:  Negative for seizures and syncope.  All other systems reviewed and are negative.   Physical Exam Updated Vital Signs BP (!) 156/61   Pulse 86   Temp 97.6 F (36.4 C) (Oral)   Resp 16   SpO2 95%  Physical Exam Vitals and nursing note reviewed.  Constitutional:      General: She is not in acute distress.    Appearance: Normal appearance. She is well-developed.  HENT:     Head: Normocephalic and atraumatic.     Mouth/Throat:     Mouth: Mucous membranes are  moist.  Eyes:     Conjunctiva/sclera: Conjunctivae normal.  Cardiovascular:     Rate and Rhythm: Normal rate and regular rhythm.     Heart sounds: No murmur heard. Pulmonary:     Effort: Pulmonary effort is normal. No respiratory distress.     Breath sounds: Normal breath sounds.  Abdominal:     Palpations: Abdomen is soft.     Tenderness: There is no abdominal tenderness.  Musculoskeletal:        General: No swelling or deformity.     Cervical back: Normal range of motion and neck supple. No rigidity or tenderness.     Comments: Patient with a lot of tenderness to palpation and movement around the left hip area.  No leg shortening.  Good cap refill distally.  No significant pain or swelling to the knee or ankle but patient subjectively complained of that.  Skin:    General: Skin is warm and dry.     Capillary Refill: Capillary refill takes less than 2 seconds.  Neurological:     General: No focal deficit present.     Mental Status: She is alert and oriented to person, place, and time.  Psychiatric:        Mood and Affect: Mood normal.     ED Results / Procedures / Treatments   Labs (all labs ordered are listed, but only abnormal results are displayed) Labs Reviewed  COMPREHENSIVE METABOLIC PANEL - Abnormal; Notable for the following components:      Result Value   Glucose, Bld 103 (*)    Calcium 10.5 (*)    All other components within normal limits  CBC WITH DIFFERENTIAL/PLATELET - Abnormal; Notable for the following components:   Monocytes Absolute 1.4 (*)    All other components within normal limits  URINALYSIS, ROUTINE W REFLEX MICROSCOPIC    EKG None  Radiology DG Chest 1 View  Result Date: 03/14/2022 CLINICAL DATA:  Fall, preop EXAM: CHEST  1 VIEW COMPARISON:  05/19/2021 FINDINGS: Heart and mediastinal contours are within normal limits. No focal opacities or effusions. No acute bony abnormality. IMPRESSION: No active cardiopulmonary disease. Electronically  Signed   By: Rolm Baptise M.D.   On: 03/14/2022 21:11   CT Hip Left Wo Contrast  Result Date: 03/14/2022 CLINICAL DATA:  Hip trauma. Fracture suspected. Fell 5 days ago. Left hip and leg pain. EXAM: CT OF THE LEFT HIP WITHOUT CONTRAST TECHNIQUE: Multidetector CT imaging of the left hip was performed according to the standard protocol. Multiplanar CT image reconstructions were also generated. RADIATION DOSE REDUCTION: This exam was performed according to the departmental dose-optimization program which includes automated exposure control, adjustment of the mA and/or kV according to patient size and/or use of iterative reconstruction technique. COMPARISON:  Pelvis and left hip radiographs 03/14/2022 and 08/18/2021; CT chest, abdomen, and pelvis 07/28/2021 FINDINGS: Bones/Joint/Cartilage Postsurgical changes are again  seen of total left hip arthroplasty. There is again associated streak artifact that limits evaluation of adjacent bony detail. The distal tip of the femoral stem is not imaged. There are again proximal left femoral diaphyseal cerclage wires. Within the limitations of diffuse decreased bone mineralization, there appears to be new oblique linear lucency within the anterior distal intertrochanteric region suspicious for an acute fracture (sagittal series 7 images 80 through 85, coronal series 6 images 89 through 94). High-grade heterotopic bone formation is again seen superior to the greater trochanter and superior to the femoroacetabular joint. Mild-to-moderate pubic symphysis joint space narrowing and peripheral osteophytosis. Ligaments Suboptimally assessed by CT. Muscles and Tendons Chronic, unchanged moderate fatty infiltration of the left gluteus minimus and medius muscles. Soft tissues No definite hematoma is seen, within limitations of diffuse streak artifact around the hip prosthesis hardware. Mild-to-moderate distal descending colon and sigmoid colon diverticulosis. IMPRESSION: 1.  Redemonstration of total left hip arthroplasty. The previously seen proximal femoral diaphyseal cerclage wires are partially imaged. 2. There is new linear lucency indicating an acute to subacute fracture within the anterior distal intertrochanteric region of the proximal left femur, extending within 5 mm of the proximal femoral stem hardware. Electronically Signed   By: Yvonne Kendall M.D.   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 Similar lucency about the intratrochanteric femoral prosthetic hardware. Diffuse demineralization more severe demineralization of the left proximal femur. No definite displaced fracture. No evidence of dislocation. 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 No definite displaced fracture. Demineralization of the left femur limits evaluation for nondisplaced fracture, if there is continued clinical concern recommend further evaluation with cross-sectional imaging. Electronically Signed   By: Yetta Glassman M.D.   On: 03/14/2022 18:23   DG Knee Complete 4 Views Left  Result Date: 03/14/2022 CLINICAL DATA:  Fall. EXAM: LEFT KNEE - COMPLETE 4+ VIEW COMPARISON:  None Available. FINDINGS: The bones are diffusely osteopenic. There is no fracture, dislocation or joint effusion. AP and lateral views are limited secondary to patient positioning. There is moderate tricompartmental degenerative change with osteophyte formation and joint space narrowing. Orthopedic hardware is partially visualized in the mid femur. IMPRESSION: 1. No acute fracture or dislocation. 2. Osteopenia. 3. Moderate tricompartmental degenerative change.  Electronically Signed   By: Ronney Asters M.D.   On: 03/14/2022 18:19   DG Ankle Complete Left  Result Date: 03/14/2022 CLINICAL DATA:  Fall EXAM: LEFT ANKLE COMPLETE - 3+ VIEW COMPARISON:  None Available. FINDINGS: The bones are osteopenic. There is no evidence of fracture, dislocation, or joint effusion. There is no evidence of arthropathy or other focal bone abnormality. There is some soft tissue swelling surrounding the ankle. There is some nonspecific skin calcifications of the posterior ankle. IMPRESSION: 1. Osteopenia. Soft tissue swelling. No evidence of fracture or dislocation. Electronically Signed   By: Ronney Asters M.D.   On: 03/14/2022 18:17    Procedures Procedures    Medications Ordered in ED Medications  HYDROmorphone (DILAUDID) injection 2 mg (2 mg Intramuscular Patient Refused/Not Given 03/14/22 1731)  HYDROcodone-acetaminophen (NORCO/VICODIN) 5-325 MG per tablet 1 tablet (1 tablet Oral Given 03/14/22 1744)    ED Course/ Medical  Decision Making/ A&P                           Medical Decision Making Amount and/or Complexity of Data Reviewed Labs: ordered. Radiology: ordered.  Risk Prescription drug management. Decision regarding hospitalization.   Patient with significant discomfort around the left hip area.  Initial x-ray of the left hip was not definitive.  Clinically were very concerned so she went on to have CT of the left hip which showed evidence of a new linear lucency indicating acute to subacute fracture within the anterior distal intertrochanteric region of the proximal left femur extending within 5 mm of the proximal femur stem hardware.  X-rays of her knee and ankle without any bony injuries.  Patient's labs Coumadin INR still pending.  With CBC no leukocytosis hemoglobin 12.7 platelets are normal complete metabolic panel glucose 975 electrolytes normal renal function GFR greater than 60 and liver function test normal.  Urinalysis still  pending.  Discussed with [name] recommending admission and they will reevaluate the hip in the morning may need additional imaging modalities done.  Contacted hospitalist for admission.  No plan for surgery in the morning at this time. Final Clinical Impression(s) / ED Diagnoses Final diagnoses:  Closed fracture of left hip, initial encounter [facility])    Rx / [place] Orders ED Discharge Orders     None         Fredia Sorrow, MD 03/14/22 2322

## 2022-03-14 NOTE — ED Triage Notes (Signed)
BIB via EMS from home for left hip pain. Pt fell 5 days ago and has left side hip and leg pain. Pt uses a wheelchair, but stood up with assistance at her dr office to get weighed and then fell.

## 2022-03-15 ENCOUNTER — Encounter (HOSPITAL_COMMUNITY): Payer: Self-pay | Admitting: Internal Medicine

## 2022-03-15 DIAGNOSIS — I825Z9 Chronic embolism and thrombosis of unspecified deep veins of unspecified distal lower extremity: Secondary | ICD-10-CM

## 2022-03-15 DIAGNOSIS — C50411 Malignant neoplasm of upper-outer quadrant of right female breast: Secondary | ICD-10-CM

## 2022-03-15 DIAGNOSIS — Z170 Estrogen receptor positive status [ER+]: Secondary | ICD-10-CM

## 2022-03-15 DIAGNOSIS — I10 Essential (primary) hypertension: Secondary | ICD-10-CM

## 2022-03-15 DIAGNOSIS — S72002A Fracture of unspecified part of neck of left femur, initial encounter for closed fracture: Secondary | ICD-10-CM | POA: Diagnosis not present

## 2022-03-15 LAB — URINALYSIS, ROUTINE W REFLEX MICROSCOPIC
Bilirubin Urine: NEGATIVE
Glucose, UA: NEGATIVE mg/dL
Hgb urine dipstick: NEGATIVE
Ketones, ur: NEGATIVE mg/dL
Leukocytes,Ua: NEGATIVE
Nitrite: NEGATIVE
Protein, ur: NEGATIVE mg/dL
Specific Gravity, Urine: 1.011 (ref 1.005–1.030)
pH: 7 (ref 5.0–8.0)

## 2022-03-15 LAB — PROTIME-INR
INR: 2.2 — ABNORMAL HIGH (ref 0.8–1.2)
INR: 2.3 — ABNORMAL HIGH (ref 0.8–1.2)
Prothrombin Time: 24.6 seconds — ABNORMAL HIGH (ref 11.4–15.2)
Prothrombin Time: 25.4 seconds — ABNORMAL HIGH (ref 11.4–15.2)

## 2022-03-15 MED ORDER — HYDROCODONE-ACETAMINOPHEN 5-325 MG PO TABS
1.0000 | ORAL_TABLET | Freq: Four times a day (QID) | ORAL | Status: DC | PRN
  Administered 2022-03-15: 2 via ORAL
  Administered 2022-03-15: 1 via ORAL
  Administered 2022-03-15 – 2022-03-16 (×4): 2 via ORAL
  Filled 2022-03-15 (×5): qty 2
  Filled 2022-03-15 (×2): qty 1

## 2022-03-15 MED ORDER — ACETAMINOPHEN 325 MG PO TABS: 650.0000 mg | ORAL_TABLET | Freq: Four times a day (QID) | ORAL | Status: DC | PRN

## 2022-03-15 MED ORDER — ONDANSETRON HCL 4 MG/2ML IJ SOLN: 4.0000 mg | Freq: Four times a day (QID) | INTRAMUSCULAR | Status: DC | PRN

## 2022-03-15 MED ORDER — HYDRALAZINE HCL 20 MG/ML IJ SOLN
5.0000 mg | Freq: Four times a day (QID) | INTRAMUSCULAR | Status: DC | PRN
  Filled 2022-03-15: qty 1

## 2022-03-15 MED ORDER — LISINOPRIL 10 MG PO TABS: 10.0000 mg | ORAL_TABLET | Freq: Every day | ORAL | Status: DC

## 2022-03-15 MED ORDER — NALOXONE HCL 0.4 MG/ML IJ SOLN: 0.4000 mg | INTRAMUSCULAR | Status: DC | PRN

## 2022-03-15 MED ORDER — LISINOPRIL 10 MG PO TABS
10.0000 mg | ORAL_TABLET | Freq: Every day | ORAL | Status: DC
  Administered 2022-03-15 – 2022-03-17 (×3): 10 mg via ORAL
  Filled 2022-03-15 (×3): qty 1

## 2022-03-15 MED ORDER — ENSURE ENLIVE PO LIQD
237.0000 mL | Freq: Two times a day (BID) | ORAL | Status: DC
  Administered 2022-03-15: 237 mL via ORAL

## 2022-03-15 MED ORDER — LEVOTHYROXINE SODIUM 125 MCG PO TABS
125.0000 ug | ORAL_TABLET | Freq: Every day | ORAL | Status: DC
  Administered 2022-03-15 – 2022-03-17 (×3): 125 ug via ORAL
  Filled 2022-03-15 (×3): qty 1

## 2022-03-15 NOTE — Progress Notes (Signed)
PROGRESS NOTE    Alicia Vasquez  GXQ:[id number] DOB: [birth year] DOA: 03/14/2022 PCP: [name], MD    Brief Narrative:  81 year old female with history of stage IV breast cancer, previous history of DVT on anticoagulation, hypothyroidism, admitted to the hospital with left hip pain after having a fall.  Noted on CT of her hip to have possible acute to subacute fracture within the anterior distal intertrochanteric region of the proximal left femur.  Orthopedics consulted.   Assessment & Plan:   Principal Problem:   Hip fracture ([place]) Active Problems:   Hypothyroid   HTN (hypertension)   Malignant neoplasm of upper-outer quadrant of right breast in female, estrogen receptor positive ([place])   DVT, lower extremity ([place])   Left hip fracture Fall -Noted on CT of left hip, acute to subacute fracture within the anterior distal intertrochanteric region of the proximal left femur -Orthopedic consulted -Continue pain management -Physical therapy once cleared by Ortho  Dysuria -UA is negative for any signs of infection  Stage IV breast cancer with bone mets -Currently on fulvestrant every 4 weeks -Continue follow-up with oncology  History of left lower extremity DVT in 2014 -She is on indefinite anticoagulation due to sedentary status/high risk of recurrent DVT -INR is currently therapeutic at 2.3 -Coumadin is currently on hold until decision made regarding need for surgery -If patient does need operative intervention, could receive a small dose of vitamin K in order to reverse INR.  Could also just let INR drift down to subtherapeutic range depending on timing of surgery  Hypertension -Continue on lisinopril  Hypothyroidism -Continue Synthroid   DVT prophylaxis:   Coumadin  Code Status: Full code Family Communication: Discussed with patient Disposition Plan: Status is: Inpatient Remains inpatient appropriate because: Awaiting orthopedic evaluation for possible  left hip fracture     Consultants:  Orthopedics, [name]  Procedures:    Antimicrobials:      Subjective: Complains of continued pain in her left hip that radiates down to her knee with any sort of movement.  Objective: Vitals:   03/15/22 0019 03/15/22 0112 03/15/22 0432 03/15/22 0755  BP: (!) 187/84  (!) 145/85 (!) 153/69  Pulse: 83  84 91  Resp: '18  16 16  '$ Temp: 97.9 F (36.6 C)  98.3 F (36.8 C) 98 F (36.7 C)  TempSrc: Oral  Oral   SpO2: 97%  93% 94%  Weight:  99.8 kg    Height:  '5\' 4"'$  (1.626 m)      Intake/Output Summary (Last 24 hours) at 03/15/2022 1135 Last data filed at 03/15/2022 0600 Gross per 24 hour  Intake 210 ml  Output --  Net 210 ml   Filed Weights   03/15/22 0112  Weight: 99.8 kg    Examination:  General exam: Appears calm and comfortable  Respiratory system: Clear to auscultation. Respiratory effort normal. Cardiovascular system: S1 & S2 heard, RRR. No JVD, murmurs, rubs, gallops or clicks. No pedal edema. Gastrointestinal system: Abdomen is nondistended, soft and nontender. No organomegaly or masses felt. Normal bowel sounds heard. Central nervous system: Alert and oriented. No focal neurological deficits. Extremities: She is tender over her left hip Skin: No rashes, lesions or ulcers Psychiatry: Judgement and insight appear normal. Mood & affect appropriate.     Data Reviewed: I have personally reviewed following labs and imaging studies  CBC: Recent Labs  Lab 03/09/22 1133 03/14/22 2049  WBC 8.6 9.0  NEUTROABS 5.7 5.1  HGB 13.0 12.7  HCT 39.8  40.2  MCV 86.9 89.1  PLT 287 656   Basic Metabolic Panel: Recent Labs  Lab 03/09/22 1133 03/14/22 2049  NA 141 140  K 4.1 3.8  CL 106 107  CO2 29 27  GLUCOSE 103* 103*  BUN 16 16  CREATININE 0.79 0.85  CALCIUM 10.5* 10.5*   GFR: Estimated Creatinine Clearance: 58.6 mL/min (by C-G formula based on SCr of 0.85 mg/dL). Liver Function Tests: Recent Labs  Lab  03/09/22 1133 03/14/22 2049  AST 12* 15  ALT 10 12  ALKPHOS 125 105  BILITOT 0.5 0.5  PROT 7.2 6.9  ALBUMIN 4.0 3.7   No results for input(s): "LIPASE", "AMYLASE" in the last 168 hours. No results for input(s): "AMMONIA" in the last 168 hours. Coagulation Profile: Recent Labs  Lab 03/09/22 1133 03/15/22 0018 03/15/22 0327  INR 1.4* 2.2* 2.3*   Cardiac Enzymes: No results for input(s): "CKTOTAL", "CKMB", "CKMBINDEX", "TROPONINI" in the last 168 hours. BNP (last 3 results) No results for input(s): "PROBNP" in the last 8760 hours. HbA1C: No results for input(s): "HGBA1C" in the last 72 hours. CBG: No results for input(s): "GLUCAP" in the last 168 hours. Lipid Profile: No results for input(s): "CHOL", "HDL", "LDLCALC", "TRIG", "CHOLHDL", "LDLDIRECT" in the last 72 hours. Thyroid Function Tests: No results for input(s): "TSH", "T4TOTAL", "FREET4", "T3FREE", "THYROIDAB" in the last 72 hours. Anemia Panel: No results for input(s): "VITAMINB12", "FOLATE", "FERRITIN", "TIBC", "IRON", "RETICCTPCT" in the last 72 hours. Sepsis Labs: No results for input(s): "PROCALCITON", "LATICACIDVEN" in the last 168 hours.  No results found for this or any previous visit (from the past 240 hour(s)).       Radiology Studies: DG Chest 1 View  Result Date: 03/14/2022 CLINICAL DATA:  Fall, preop EXAM: CHEST  1 VIEW COMPARISON:  05/19/2021 FINDINGS: Heart and mediastinal contours are within normal limits. No focal opacities or effusions. No acute bony abnormality. IMPRESSION: No active cardiopulmonary disease. Electronically Signed   By: Rolm Baptise M.D.   On: 03/14/2022 21:11   CT Hip Left Wo Contrast  Result Date: 03/14/2022 CLINICAL DATA:  Hip trauma. Fracture suspected. Fell 5 days ago. Left hip and leg pain. EXAM: CT OF THE LEFT HIP WITHOUT CONTRAST TECHNIQUE: Multidetector CT imaging of the left hip was performed according to the standard protocol. Multiplanar CT image reconstructions  were also generated. RADIATION DOSE REDUCTION: This exam was performed according to the departmental dose-optimization program which includes automated exposure control, adjustment of the mA and/or kV according to patient size and/or use of iterative reconstruction technique. COMPARISON:  Pelvis and left hip radiographs 03/14/2022 and 08/18/2021; CT chest, abdomen, and pelvis 07/28/2021 FINDINGS: Bones/Joint/Cartilage Postsurgical changes are again seen of total left hip arthroplasty. There is again associated streak artifact that limits evaluation of adjacent bony detail. The distal tip of the femoral stem is not imaged. There are again proximal left femoral diaphyseal cerclage wires. Within the limitations of diffuse decreased bone mineralization, there appears to be new oblique linear lucency within the anterior distal intertrochanteric region suspicious for an acute fracture (sagittal series 7 images 80 through 85, coronal series 6 images 89 through 94). High-grade heterotopic bone formation is again seen superior to the greater trochanter and superior to the femoroacetabular joint. Mild-to-moderate pubic symphysis joint space narrowing and peripheral osteophytosis. Ligaments Suboptimally assessed by CT. Muscles and Tendons Chronic, unchanged moderate fatty infiltration of the left gluteus minimus and medius muscles. Soft tissues No definite hematoma is seen, within limitations of diffuse streak artifact around  the hip prosthesis hardware. Mild-to-moderate distal descending colon and sigmoid colon diverticulosis. IMPRESSION: 1. Redemonstration of total left hip arthroplasty. The previously seen proximal femoral diaphyseal cerclage wires are partially imaged. 2. There is new linear lucency indicating an acute to subacute fracture within the anterior distal intertrochanteric region of the proximal left femur, extending within 5 mm of the proximal femoral stem hardware. Electronically Signed   By: Yvonne Kendall  M.D.   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 Similar lucency about the intratrochanteric femoral prosthetic hardware. Diffuse demineralization more severe demineralization of the left proximal femur. No definite displaced fracture. No evidence of dislocation. 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 No definite displaced fracture. Demineralization of the left femur limits evaluation for nondisplaced fracture, if there is continued clinical concern recommend further evaluation with cross-sectional imaging. Electronically Signed   By: Yetta Glassman M.D.   On: 03/14/2022 18:23   DG Knee Complete 4 Views Left  Result Date: 03/14/2022 CLINICAL DATA:  Fall. EXAM: LEFT KNEE - COMPLETE 4+ VIEW COMPARISON:  None Available. FINDINGS: The bones are diffusely osteopenic. There is no fracture, dislocation or joint effusion. AP and lateral views are limited secondary to patient positioning. There is moderate tricompartmental degenerative change with osteophyte formation and joint space narrowing. Orthopedic hardware is partially visualized in the mid femur. IMPRESSION: 1. No acute fracture or dislocation. 2. Osteopenia. 3. Moderate tricompartmental degenerative change. Electronically Signed   By: Ronney Asters M.D.   On: 03/14/2022 18:19   DG Ankle Complete Left  Result Date: 03/14/2022 CLINICAL DATA:  Fall EXAM: LEFT ANKLE COMPLETE - 3+ VIEW COMPARISON:  None Available. FINDINGS: The bones are osteopenic. There is no evidence of fracture, dislocation, or joint effusion. There is no evidence of arthropathy or other focal bone abnormality. There is some  soft tissue swelling surrounding the ankle. There is some nonspecific skin calcifications of the posterior ankle. IMPRESSION: 1. Osteopenia. Soft tissue swelling. No evidence of fracture or dislocation. Electronically Signed   By: Ronney Asters M.D.   On: 03/14/2022 18:17        Scheduled Meds:  levothyroxine  125 mcg Oral Q0600   lisinopril  10 mg Oral Daily   Continuous Infusions:   LOS: 1 day    Time spent: 62mns    JKathie Dike MD [facility]   If 7PM-7AM, please contact night-coverage [email or web address]  03/15/2022, 11:35 AM

## 2022-03-15 NOTE — Consult Note (Signed)
Reason for Consult: left hip pain after fall Referring Physician: Roderic Palau, PA-C  Alicia Vasquez is an 81 y.o. female.  HPI: 81 year old female with history of stage IV breast cancer, previous history of DVT on anticoagulation, hypothyroidism, admitted to the hospital with left hip pain after having a fall.  Noted on CT of her hip to have possible acute to subacute fracture within the anterior distal intertrochanteric region of the proximal left femur.  Orthopedics consulted.  History of revision left total hip revision  Past Medical History:  Diagnosis Date   Allergy    Anxiety    Arthritis    Blood transfusion without reported diagnosis    Breast cancer ([place]) 07/12/2013   Invasive Mammary Carcinoma   DVT (deep vein thrombosis) in pregnancy    Hypertension    Hypothyroid    Metastatic cancer to bone ([place]) dx'd 12/2016   hip   Personal history of radiation therapy    Pneumonia    PONV (postoperative nausea and vomiting)    Radiation 11/21/13-01/07/14   Right Breast/Supraclavicular    Past Surgical History:  Procedure Laterality Date   BREAST LUMPECTOMY Left 2015   BREAST LUMPECTOMY WITH RADIOACTIVE SEED LOCALIZATION Right 09/09/2013   Procedure: BREAST LUMPECTOMY WITH RADIOACTIVE SEED LOCALIZATION WITH AXILLARY NODE EXCISION;  Surgeon: [name], MD;  Location: [facility];  Service: General;  Laterality: Right;   CYSTOSCOPY W/ URETERAL STENT PLACEMENT Right 01/07/2021   Procedure: CYSTOSCOPY WITH RETROGRADE PYELOGRAM/URETERAL STENT PLACEMENT;  Surgeon: Bjorn Loser, MD;  Location: [facility];  Service: Urology;  Laterality: Right;   CYSTOSCOPY/URETEROSCOPY/HOLMIUM LASER/STENT PLACEMENT Right 02/08/2021   Procedure: CYSTOSCOPY, RIGHT URETEROSCOPY, RIGHT RETRGRADE PYELOGRAM, HOLMIUM LASER/STENT PLACEMENT;  Surgeon: Lucas Mallow, MD;  Location: [facility];  Service: Urology;  Laterality: Right;   DENTAL SURGERY  04/19/2012   13 TEETH REMOVED   DILATION AND  CURETTAGE OF UTERUS     IR RADIOLOGIST EVAL & MGMT  01/21/2021   ORIF PERIPROSTHETIC FRACTURE Left 01/31/2017   Procedure: REVISION and OPEN REDUCTION INTERNAL FIXATION (ORIF) PERIPROSTHETIC FRACTURE LEFT HIP;  Surgeon: Paralee Cancel, MD;  Location: [facility];  Service: Orthopedics;  Laterality: Left;  120 mins   RE-EXCISION OF BREAST LUMPECTOMY Right 09/24/2013   Procedure: RE-EXCISION OF RIGHT BREAST LUMPECTOMY;  Surgeon: [name], MD;  Location: [facility];  Service: General;  Laterality: Right;   TOTAL HIP ARTHROPLASTY Left 01/16/2017   Procedure: TOTAL HIP ARTHROPLASTY POSTERIOR;  Surgeon: Paralee Cancel, MD;  Location: [facility];  Service: Orthopedics;  Laterality: Left;    Family History  Problem Relation Age of Onset   Heart disease Brother    Colon cancer Brother    Prostate cancer Brother     Social History:  reports that she has never smoked. She has never used smokeless tobacco. She reports that she does not drink alcohol and does not use drugs.  Allergies:  Allergies  Allergen Reactions   Anesthetics, Amide Hypertension   Benadryl [Diphenhydramine Hcl] Other (See Comments)    Dizziness   Carbocaine [Mepivacaine Hcl] Hypertension   Codeine Other (See Comments)    Dizziness   Epinephrine Hypertension   Sulfa Antibiotics Other (See Comments)    dizziness   Diphenhydramine    Latex Other (See Comments)    Blisters in mouth   Penicillins Rash    Has patient had a PCN reaction causing immediate rash, facial/tongue/throat swelling, SOB or lightheadedness with hypotension: Unknown Has patient had a PCN reaction causing  severe rash involving mucus membranes or skin necrosis: Unknown Has patient had a PCN reaction that required hospitalization: Unknown Has patient had a PCN reaction occurring within the last 10 years: Unknown If all of the above answers are "NO", then may proceed with Cephalosporin use.    Tramadol Other (See Comments)    Sedation.      Medications: I have reviewed the patient's current medications. Scheduled:  feeding supplement  237 mL Oral BID BM   levothyroxine  125 mcg Oral Q0600   lisinopril  10 mg Oral Daily    Results for orders placed or performed during the hospital encounter of 03/14/22 (from the past 24 hour(s))  Comprehensive metabolic panel     Status: Abnormal   Collection Time: 03/14/22  8:49 PM  Result Value Ref Range   Sodium 140 135 - 145 mmol/L   Potassium 3.8 3.5 - 5.1 mmol/L   Chloride 107 98 - 111 mmol/L   CO2 27 22 - 32 mmol/L   Glucose, Bld 103 (H) 70 - 99 mg/dL   BUN 16 8 - 23 mg/dL   Creatinine, Ser 0.85 0.44 - 1.00 mg/dL   Calcium 10.5 (H) 8.9 - 10.3 mg/dL   Total Protein 6.9 6.5 - 8.1 g/dL   Albumin 3.7 3.5 - 5.0 g/dL   AST 15 15 - 41 U/L   ALT 12 0 - 44 U/L   Alkaline Phosphatase 105 38 - 126 U/L   Total Bilirubin 0.5 0.3 - 1.2 mg/dL   GFR, Estimated >60 >60 mL/min   Anion gap 6 5 - 15  CBC with Differential/Platelet     Status: Abnormal   Collection Time: 03/14/22  8:49 PM  Result Value Ref Range   WBC 9.0 4.0 - 10.5 K/uL   RBC 4.51 3.87 - 5.11 MIL/uL   Hemoglobin 12.7 12.0 - 15.0 g/dL   HCT 40.2 36.0 - 46.0 %   MCV 89.1 80.0 - 100.0 fL   MCH 28.2 26.0 - 34.0 pg   MCHC 31.6 30.0 - 36.0 g/dL   RDW 15.1 11.5 - 15.5 %   Platelets 311 150 - 400 K/uL   nRBC 0.0 0.0 - 0.2 %   Neutrophils Relative % 58 %   Neutro Abs 5.1 1.7 - 7.7 K/uL   Lymphocytes Relative 23 %   Lymphs Abs 2.1 0.7 - 4.0 K/uL   Monocytes Relative 15 %   Monocytes Absolute 1.4 (H) 0.1 - 1.0 K/uL   Eosinophils Relative 3 %   Eosinophils Absolute 0.3 0.0 - 0.5 K/uL   Basophils Relative 1 %   Basophils Absolute 0.1 0.0 - 0.1 K/uL   Immature Granulocytes 0 %   Abs Immature Granulocytes 0.03 0.00 - 0.07 K/uL  Protime-INR     Status: Abnormal   Collection Time: 03/15/22 12:18 AM  Result Value Ref Range   Prothrombin Time 24.6 (H) 11.4 - 15.2 seconds   INR 2.2 (H) 0.8 - 1.2  Protime-INR     Status:  Abnormal   Collection Time: 03/15/22  3:27 AM  Result Value Ref Range   Prothrombin Time 25.4 (H) 11.4 - 15.2 seconds   INR 2.3 (H) 0.8 - 1.2  Urinalysis, Routine w reflex microscopic Urine, Clean Catch     Status: Abnormal   Collection Time: 03/15/22  6:06 AM  Result Value Ref Range   Color, Urine YELLOW YELLOW   APPearance CLOUDY (A) CLEAR   Specific Gravity, Urine 1.011 1.005 - 1.030   pH 7.0  5.0 - 8.0   Glucose, UA NEGATIVE NEGATIVE mg/dL   Hgb urine dipstick NEGATIVE NEGATIVE   Bilirubin Urine NEGATIVE NEGATIVE   Ketones, ur NEGATIVE NEGATIVE mg/dL   Protein, ur NEGATIVE NEGATIVE mg/dL   Nitrite NEGATIVE NEGATIVE   Leukocytes,Ua NEGATIVE NEGATIVE   *Note: Due to a large number of results and/or encounters for the requested time period, some results have not been displayed. A complete set of results can be found in Results Review.     X-ray: CLINICAL DATA:  Hip trauma. Fracture suspected. Fell 5 days ago. Left hip and leg pain.   EXAM: CT OF THE LEFT HIP WITHOUT CONTRAST   TECHNIQUE: Multidetector CT imaging of the left hip was performed according to the standard protocol. Multiplanar CT image reconstructions were also generated.   RADIATION DOSE REDUCTION: This exam was performed according to the departmental dose-optimization program which includes automated exposure control, adjustment of the mA and/or kV according to patient size and/or use of iterative reconstruction technique.   COMPARISON:  Pelvis and left hip radiographs 03/14/2022 and 08/18/2021; CT chest, abdomen, and pelvis 07/28/2021   FINDINGS: Bones/Joint/Cartilage   Postsurgical changes are again seen of total left hip arthroplasty. There is again associated streak artifact that limits evaluation of adjacent bony detail. The distal tip of the femoral stem is not imaged. There are again proximal left femoral diaphyseal cerclage wires. Within the limitations of diffuse decreased  bone mineralization, there appears to be new oblique linear lucency within the anterior distal intertrochanteric region suspicious for an acute fracture (sagittal series 7 images 80 through 85, coronal series 6 images 89 through 94). High-grade heterotopic bone formation is again seen superior to the greater trochanter and superior to the femoroacetabular joint.   Mild-to-moderate pubic symphysis joint space narrowing and peripheral osteophytosis.   Ligaments   Suboptimally assessed by CT.   Muscles and Tendons   Chronic, unchanged moderate fatty infiltration of the left gluteus minimus and medius muscles.   Soft tissues   No definite hematoma is seen, within limitations of diffuse streak artifact around the hip prosthesis hardware. Mild-to-moderate distal descending colon and sigmoid colon diverticulosis.   IMPRESSION: 1. Redemonstration of total left hip arthroplasty. The previously seen proximal femoral diaphyseal cerclage wires are partially imaged. 2. There is new linear lucency indicating an acute to subacute fracture within the anterior distal intertrochanteric region of the proximal left femur, extending within 5 mm of the proximal femoral stem hardware.     Electronically Signed   By: Yvonne Kendall M.D.  EXAM: DG HIP (WITH OR WITHOUT PELVIS) 3V LEFT   COMPARISON:  Hip radiograph dated August 18, 2021   FINDINGS: Prior total left hip arthroplasty with heterotopic ossification, unchanged in appearance when compared with prior hip radiograph. Similar lucency about the intratrochanteric femoral prosthetic hardware. Diffuse demineralization more severe demineralization of the left proximal femur. No definite displaced fracture. No evidence of dislocation.   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 No definite displaced fracture. Demineralization of the left femur limits evaluation for nondisplaced fracture, if there is  continued clinical concern recommend further evaluation with cross-sectional imaging.     Electronically Signed   By: Yetta Glassman M.D.  ROS: As per HPI  Blood pressure (!) 164/82, pulse 78, temperature 97.8 F (36.6 C), temperature source Oral, resp. rate 16, height '5\' 4"'$  (1.626 m), weight 99.8 kg, SpO2 95 %.  Physical Exam: Vitals reviewed.  Constitutional:      General: She  is not in acute distress. HENT:     Head: Normocephalic and atraumatic.  Eyes:     Extraocular Movements: Extraocular movements intact.  Cardiovascular:     Rate and Rhythm: Normal rate and regular rhythm.     Pulses: Normal pulses.  Pulmonary:     Effort: Pulmonary effort is normal. No respiratory distress.     Breath sounds: Normal breath sounds. No wheezing or rales.  Abdominal:     General: Bowel sounds are normal. There is no distension.     Palpations: Abdomen is soft.     Tenderness: There is no abdominal tenderness.  Musculoskeletal:    LLE exam: tender to palpation laterally around hip and lateral thigh NVI Painful ROM, passive and active Skin:    General: Skin is warm and dry.  Neurological:     General: No focal deficit present.     Mental Status: She is alert and oriented to person, place, and time.   Assessment/Plan: Non displaced left proximal peri-prosthetic femur fracture  Plan: Non operative management WBAT with walker at all times RTC in 4 weeks for Xray follow up Pain control as needed  Mauri Pole 03/15/2022, 2:40 PM

## 2022-03-15 NOTE — Plan of Care (Signed)

## 2022-03-15 NOTE — Progress Notes (Signed)
Initial Nutrition Assessment  DOCUMENTATION CODES:   Obesity unspecified  INTERVENTION:   -Ensure Plus High Protein po BID, each supplement provides 350 kcal and 20 grams of protein.   NUTRITION DIAGNOSIS:   Increased nutrient needs related to hip fracture as evidenced by estimated needs.  GOAL:   Patient will meet greater than or equal to 90% of their needs  MONITOR:   PO intake, Supplement acceptance, Labs, Weight trends, I & O's  REASON FOR ASSESSMENT:   Consult Hip fracture protocol  ASSESSMENT:   81 y.o. 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  Patient reporting she feels hungry today. On a diet, awaiting evaluation from ortho. In anticipation of increased needs from surgery, will order Ensure supplements for additional kcals and protein.  Per weight records, pt's weight has remained stable.  Medications reviewed.  Labs reviewed.  NUTRITION - FOCUSED PHYSICAL EXAM:  No depletions noted  Diet Order:   Diet Order             Diet Heart Room service appropriate? Yes; Fluid consistency: Thin  Diet effective now                   EDUCATION NEEDS:   No education needs have been identified at this time  Skin:  Skin Assessment: Reviewed RN Assessment  Last BM:  10/15  Height:   Ht Readings from Last 1 Encounters:  03/15/22 '5\' 4"'$  (1.626 m)    Weight:   Wt Readings from Last 1 Encounters:  03/15/22 99.8 kg    BMI:  Body mass index is 37.77 kg/m.  Estimated Nutritional Needs:   Kcal:  1400-1600  Protein:  70-85g  Fluid:  1.6L/day  Clayton Bibles, MS, RD, LDN Inpatient Clinical Dietitian Contact information  available via Amion

## 2022-03-16 DIAGNOSIS — E039 Hypothyroidism, unspecified: Secondary | ICD-10-CM

## 2022-03-16 DIAGNOSIS — I82542 Chronic embolism and thrombosis of left tibial vein: Secondary | ICD-10-CM | POA: Diagnosis not present

## 2022-03-16 DIAGNOSIS — S72142A Displaced intertrochanteric fracture of left femur, initial encounter for closed fracture: Secondary | ICD-10-CM | POA: Diagnosis present

## 2022-03-16 DIAGNOSIS — I10 Essential (primary) hypertension: Secondary | ICD-10-CM | POA: Diagnosis not present

## 2022-03-16 LAB — COMPREHENSIVE METABOLIC PANEL
ALT: 11 U/L (ref 0–44)
AST: 15 U/L (ref 15–41)
Albumin: 3.4 g/dL — ABNORMAL LOW (ref 3.5–5.0)
Alkaline Phosphatase: 100 U/L (ref 38–126)
Anion gap: 6 (ref 5–15)
BUN: 19 mg/dL (ref 8–23)
CO2: 27 mmol/L (ref 22–32)
Calcium: 10.6 mg/dL — ABNORMAL HIGH (ref 8.9–10.3)
Chloride: 108 mmol/L (ref 98–111)
Creatinine, Ser: 0.87 mg/dL (ref 0.44–1.00)
GFR, Estimated: >60 mL/min (ref >60)
Glucose, Bld: 96 mg/dL (ref 70–99)
Potassium: 3.8 mmol/L (ref 3.5–5.1)
Sodium: 141 mmol/L (ref 135–145)
Total Bilirubin: 0.6 mg/dL (ref 0.3–1.2)
Total Protein: 6.5 g/dL (ref 6.5–8.1)

## 2022-03-16 LAB — CBC WITH DIFFERENTIAL/PLATELET
Abs Immature Granulocytes: 0.02 10*3/uL (ref 0.00–0.07)
Basophils Absolute: 0.1 10*3/uL (ref 0.0–0.1)
Basophils Relative: 1 %
Eosinophils Absolute: 0.4 10*3/uL (ref 0.0–0.5)
Eosinophils Relative: 5 %
HCT: 42.1 % (ref 36.0–46.0)
Hemoglobin: 13.3 g/dL (ref 12.0–15.0)
Immature Granulocytes: 0 %
Lymphocytes Relative: 26 %
Lymphs Abs: 1.8 10*3/uL (ref 0.7–4.0)
MCH: 28.8 pg (ref 26.0–34.0)
MCHC: 31.6 g/dL (ref 30.0–36.0)
MCV: 91.1 fL (ref 80.0–100.0)
Monocytes Absolute: 1 10*3/uL (ref 0.1–1.0)
Monocytes Relative: 14 %
Neutro Abs: 3.7 10*3/uL (ref 1.7–7.7)
Neutrophils Relative %: 54 %
Platelets: 253 10*3/uL (ref 150–400)
RBC: 4.62 MIL/uL (ref 3.87–5.11)
RDW: 15.2 % (ref 11.5–15.5)
WBC: 6.9 10*3/uL (ref 4.0–10.5)
nRBC: 0 % (ref 0.0–0.2)

## 2022-03-16 LAB — PROTIME-INR
INR: 2.2 — ABNORMAL HIGH (ref 0.8–1.2)
Prothrombin Time: 24.5 seconds — ABNORMAL HIGH (ref 11.4–15.2)

## 2022-03-16 LAB — MAGNESIUM: Magnesium: 2.2 mg/dL (ref 1.7–2.4)

## 2022-03-16 MED ORDER — WARFARIN - PHARMACIST DOSING INPATIENT: Freq: Every day | Status: DC

## 2022-03-16 MED ORDER — WARFARIN SODIUM 5 MG PO TABS
10.0000 mg | ORAL_TABLET | Freq: Once | ORAL | Status: AC
  Administered 2022-03-16: 10 mg via ORAL
  Filled 2022-03-16: qty 2

## 2022-03-16 MED ORDER — OXYCODONE HCL 5 MG PO TABS
5.0000 mg | ORAL_TABLET | Freq: Four times a day (QID) | ORAL | Status: DC | PRN
  Filled 2022-03-16 (×2): qty 1

## 2022-03-16 MED ORDER — OXYCODONE HCL 5 MG PO TABS
10.0000 mg | ORAL_TABLET | Freq: Four times a day (QID) | ORAL | Status: DC | PRN
  Administered 2022-03-16 – 2022-03-17 (×2): 10 mg via ORAL
  Filled 2022-03-16: qty 2

## 2022-03-16 MED ORDER — ACETAMINOPHEN 325 MG PO TABS
650.0000 mg | ORAL_TABLET | Freq: Two times a day (BID) | ORAL | Status: DC
  Administered 2022-03-16 – 2022-03-17 (×2): 650 mg via ORAL
  Filled 2022-03-16 (×2): qty 2
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
## 2022-03-16 NOTE — Hospital Course (Signed)
Alicia Vasquez is a 81 y.o. 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    CT of left hip showing an acute to subacute fracture within the anterior distal intertrochanteric region of the proximal left femur extending within 5 mm of the proximal femoral stem hardware.  X-rays of left ankle and knee negative for fracture or dislocation despite patient's complaints of significant pain in these areas with associated tenderness.  Hospitalist group was then called to assess the patient for admission to the hospital.  Patient was evaluated by [name] with orthopedic surgery who recommended conservative management.  He recommended weightbearing as tolerated using a walker and close observation.  He recommended outpatient follow-up with orthopedic surgery in 4 weeks for follow-up x-ray and evaluation.  Throughout the remainder of the hospitalization patient was placed on a regimen of scheduled Tylenol twice daily with as needed oral oxycodone as needed for breakthrough pain.  This worked well and patient was able to participate with physical therapy.   Arrangements were made for the patient to be discharged to a skilled nursing facility for continued skilled rehabilitation and close monitoring.  Patient was discharged on 03/17/2022 in fair condition.

## 2022-03-16 NOTE — Assessment & Plan Note (Signed)
.   Resume patients home regimen of oral antihypertensives with lisinopril 10 mg daily . Blood pressure likely exacerbated by pain . Titrate antihypertensive regimen as necessary to achieve adequate BP control . PRN intravenous antihypertensives for excessively elevated blood pressure

## 2022-03-16 NOTE — Progress Notes (Signed)
Progress Note   Patient: Alicia Vasquez OVF:[id number] DOB: 1940-06-15 DOA: 03/14/2022     2 DOS: the patient was seen and examined on 03/16/2022   Brief hospital course: Tekisha Darcey Orlich is a 81 y.o. 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     CT of left hip showing an acute to subacute fracture within the anterior distal intertrochanteric region of the proximal left femur extending within 5 mm of the proximal femoral stem hardware.  X-rays of left ankle and knee negative for fracture or dislocation.  Chest x-ray negative for acute finding. Patient was given Norco for pain. TRH called to admit.    Assessment and Plan: * Closed comminuted intertrochanteric fracture of proximal end of left femur, initial encounter ([place]) [name] with orthopedic surgery consulted and following, their input is appreciated. Conservative management is recommended Weightbearing as tolerated using a walker Outpatient follow-up with orthopedic surgery in 4 weeks for follow-up x-ray Placing patient on scheduled Tylenol twice daily with as needed oxycodone for breakthrough Vitamin D level pending  Essential hypertension Resume patients home regimen of oral antihypertensives with lisinopril 10 mg daily Blood pressure likely exacerbated by pain Titrate antihypertensive regimen as necessary to achieve adequate BP control PRN intravenous antihypertensives for excessively elevated blood pressure    Hypothyroidism Resume home regimen of Synthroid    Malignant neoplasm of upper-outer quadrant of right breast in female, estrogen receptor positive (HCC) Currently on fulvestrant  every 4 weeks Outpatient oncology follow-up  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  Pain is worse with movement of the left lower extremity.  Physical Exam: Vitals:   03/15/22 2216 03/16/22 0527 03/16/22 0828 03/16/22 1334  BP: (!) 152/66 (!) 165/75 (!) 153/72 (!) 143/65  Pulse: 74 73 70 71  Resp: '17 17 16 16  '$ Temp: 97.6 F (36.4 C) 97.6 F (36.4 C) (!) 97.4 F (36.3 C) 98.2 F (36.8 C)  TempSrc: Oral Oral Oral Oral  SpO2: 97% 97% 98% 99%  Weight:      Height:       Constitutional: Awake alert and oriented x3, in mild distress due to pain Respiratory: clear to auscultation bilaterally, no wheezing, no crackles. Normal respiratory effort. No accessory muscle use.  Cardiovascular: Regular rate and rhythm, no murmurs / rubs / gallops. No extremity edema. 2+ pedal pulses. No carotid bruits.  Abdomen: Abdomen is soft with left lower quadrant tenderness on palpation.  No evidence of intra-abdominal masses.  Positive bowel sounds noted in all quadrants.   Musculoskeletal: Significant pain of the left hip with both passive and active range of motion.   Data Reviewed:  Chemistry revealing calcium of 10.6  Family Communication: None  Disposition: Status is: Inpatient Remains inpatient appropriate because: Left hip fracture with intractable pain requiring frequent dosing of opiate-based analgesics as well  as expert consultation with orthopedic surgery.   Planned Discharge Destination: Home with Home Health    Time spent: 53 minutes  Author: Vernelle Emerald, MD 03/16/2022 6:31 PM  For on  call review [email or web address].

## 2022-03-16 NOTE — NC FL2 (Signed)
[place] LEVEL OF CARE SCREENING TOOL     IDENTIFICATION  Patient Name: Alicia Vasquez Birthdate: [birth year] Sex: female Admission Date (Current Location): 03/14/2022  [facility] and [place] Number:  Herbalist and Address:  [facility],  [place], [place]      Provider Number: [id number]  Attending Physician Name and Address:  Vernelle Emerald, MD  Relative Name and Phone Number:  son, Favor Kreh @ [phone]    Current Level of Care: Hospital Recommended Level of Care: [place] Prior Approval Number:    Date Approved/Denied:   PASRR Number: [id number]  Discharge Plan: SNF    Current Diagnoses: Patient Active Problem List   Diagnosis Date Noted   Hip fracture ([place]) 03/14/2022   GERD (gastroesophageal reflux disease) 06/17/2021   Bilateral leg edema 06/03/2021   Pressure injury of left buttock, stage 1 06/03/2021   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   Weakness 04/27/2021   Estrogen receptor positive status (ER+) 05/31/2019   Major depressive disorder, single episode, unspecified 05/31/2019   Morbid (severe) obesity due to excess calories ([place]) 05/31/2019   Neoplasm related pain (acute) (chronic) 05/31/2019   Aortic atherosclerosis ([place]) 06/13/2017   Pain from bone metastases ([place]) 01/25/2017   Malignant neoplasm metastatic to bone ([place]) 01/25/2017   Endometrial hyperplasia 01/16/2017   Lytic bone lesion of left femur 01/15/2017   Hip fracture, pathological ([place]) 01/15/2017   Lymphedema 09/17/2015   Long term current use of anticoagulant therapy 08/23/2015   DVT, lower extremity ([place]) 06/18/2015   Bilateral knee pain 04/03/2015   Arm edema 08/28/2014   Vitamin D deficiency 04/23/2014   Hyperparathyroidism ([place]) 04/23/2014   Depression 07/18/2013   Malignant neoplasm of upper-outer quadrant of right breast in female, estrogen receptor positive ([place]) 07/15/2013   Overactive bladder  01/30/2013   Primary hypercoagulable state ([place]) 12/19/2012   HTN (hypertension) 09/27/2011   Hearing loss 09/27/2011   Seasonal allergies 09/27/2011   Hypothyroid 08/26/2011    Orientation RESPIRATION BLADDER Height & Weight     Self, Time, Situation, Place  Normal Continent, External catheter (currently with purewick) Weight: 220 lb 0.3 oz (99.8 kg) Height:  '5\' 4"'$  (162.6 cm)  BEHAVIORAL SYMPTOMS/MOOD NEUROLOGICAL BOWEL NUTRITION STATUS      Continent    AMBULATORY STATUS COMMUNICATION OF NEEDS Skin     Verbally Normal                       Personal Care Assistance Level of Assistance  Bathing, Dressing Bathing Assistance: Limited assistance   Dressing Assistance: Limited assistance     Functional Limitations Info  Hearing   Hearing Info: Impaired      SPECIAL CARE FACTORS FREQUENCY  PT (By licensed PT), OT (By licensed OT)     PT Frequency: 5x/wk OT Frequency: 5x/wk            Contractures Contractures Info: Not present    Additional Factors Info  Code Status, Allergies Code Status Info: Full Allergies Info: Anesthetics, Amide, Benadryl (Diphenhydramine Hcl), Carbocaine (Mepivacaine Hcl), Codeine, Epinephrine, Sulfa Antibiotics, Diphenhydramine, Latex, Penicillins, Tramadol           Current Medications (03/16/2022):  This is the current hospital active medication list Current Facility-Administered Medications  Medication Dose Route Frequency Provider Last Rate Last Admin   acetaminophen (TYLENOL) tablet 650 mg  650 mg Oral Q6H PRN Kathie Dike, MD       feeding supplement (ENSURE ENLIVE /  ENSURE PLUS) liquid 237 mL  237 mL Oral BID BM Kathie Dike, MD   237 mL at 03/15/22 1704   hydrALAZINE (APRESOLINE) injection 5 mg  5 mg Intravenous Q6H PRN Shela Leff, MD       HYDROcodone-acetaminophen (NORCO/VICODIN) 5-325 MG per tablet 1-2 tablet  1-2 tablet Oral Q6H PRN Shela Leff, MD   2 tablet at 03/16/22 9379   levothyroxine  (SYNTHROID) tablet 125 mcg  125 mcg Oral Q0600 Shela Leff, MD   125 mcg at 03/16/22 0530   lisinopril (ZESTRIL) tablet 10 mg  10 mg Oral Daily Shela Leff, MD   10 mg at 03/16/22 0829   naloxone (NARCAN) injection 0.4 mg  0.4 mg Intravenous PRN Shela Leff, MD       ondansetron (ZOFRAN) injection 4 mg  4 mg Intravenous Q6H PRN Kathie Dike, MD       warfarin (COUMADIN) tablet 10 mg  10 mg Oral ONCE-1600 Eudelia Bunch, [facility]       Warfarin - Pharmacist Dosing Inpatient   Does not apply [place] T, [facility]         Discharge Medications: Please see discharge summary for a list of discharge medications.  Relevant Imaging Results:  Relevant Lab Results:   Additional Information SSN: [id number]  Lennart Pall, LCSW

## 2022-03-16 NOTE — Assessment & Plan Note (Addendum)
   [name] with orthopedic surgery consulted and following, their input is appreciated.  Conservative management is recommended  Weightbearing as tolerated using a walker  Outpatient follow-up with orthopedic surgery in 4 weeks for follow-up x-ray  Placing patient on scheduled Tylenol twice daily with as needed oxycodone for breakthrough  Vitamin D level pending

## 2022-03-16 NOTE — Assessment & Plan Note (Signed)
   Currently on fulvestrant every 4 weeks  Outpatient oncology follow-up

## 2022-03-16 NOTE — Plan of Care (Signed)
  Problem: Education: Goal: Knowledge of General Education information will improve Description: Including pain rating scale, medication(s)/side effects and non-pharmacologic comfort measures Outcome: Progressing   Problem: Health Behavior/Discharge Planning: Goal: Ability to manage health-related needs will improve Outcome: Progressing   Problem: Clinical Measurements: Goal: Ability to maintain clinical measurements within normal limits will improve Outcome: Progressing   Problem: Nutrition: Goal: Adequate nutrition will be maintained Outcome: Progressing   Problem: Elimination: Goal: Will not experience complications related to bowel motility Outcome: Progressing   Problem: Pain Managment: Goal: General experience of comfort will improve Outcome: Progressing   Problem: Safety: Goal: Ability to remain free from injury will improve Outcome: Progressing   Problem: Skin Integrity: Goal: Risk for impaired skin integrity will decrease Outcome: Progressing   

## 2022-03-16 NOTE — Progress Notes (Signed)
ANTICOAGULATION CONSULT NOTE - Initial Consult  Pharmacy Consult for warfarin Indication: hx DVT  Allergies  Allergen Reactions   Anesthetics, Amide Hypertension   Benadryl [Diphenhydramine Hcl] Other (See Comments)    Dizziness   Carbocaine [Mepivacaine Hcl] Hypertension   Codeine Other (See Comments)    Dizziness   Epinephrine Hypertension   Sulfa Antibiotics Other (See Comments)    dizziness   Diphenhydramine    Latex Other (See Comments)    Blisters in mouth   Penicillins Rash    Has patient had a PCN reaction causing immediate rash, facial/tongue/throat swelling, SOB or lightheadedness with hypotension: Unknown Has patient had a PCN reaction causing severe rash involving mucus membranes or skin necrosis: Unknown Has patient had a PCN reaction that required hospitalization: Unknown Has patient had a PCN reaction occurring within the last 10 years: Unknown If all of the above answers are "NO", then may proceed with Cephalosporin use.    Tramadol Other (See Comments)    Sedation.     Patient Measurements: Height: '5\' 4"'$  (162.6 cm) Weight: 99.8 kg (220 lb 0.3 oz) IBW/kg (Calculated) : 54.7 Heparin Dosing Weight:   Vital Signs: Temp: 97.4 F (36.3 C) (10/18 0828) Temp Source: Oral (10/18 0828) BP: 153/72 (10/18 0828) Pulse Rate: 70 (10/18 0828)  Labs: Recent Labs    03/14/22 2049 03/15/22 0018 03/15/22 0327 03/16/22 0332 03/16/22 0859  HGB 12.7  --   --   --  13.3  HCT 40.2  --   --   --  42.1  PLT 311  --   --   --  253  LABPROT  --  24.6* 25.4* 24.5*  --   INR  --  2.2* 2.3* 2.2*  --   CREATININE 0.85  --   --   --  0.87    Estimated Creatinine Clearance: 57.2 mL/min (by C-G formula based on SCr of 0.87 mg/dL).   Medical History: Past Medical History:  Diagnosis Date   Allergy    Anxiety    Arthritis    Blood transfusion without reported diagnosis    Breast cancer ([place]) 07/12/2013   Invasive Mammary Carcinoma   DVT (deep vein thrombosis) in  pregnancy    Hypertension    Hypothyroid    Metastatic cancer to bone ([place]) dx'd 12/2016   hip   Personal history of radiation therapy    Pneumonia    PONV (postoperative nausea and vomiting)    Radiation 11/21/13-01/07/14   Right Breast/Supraclavicular    Medications:  Medications Prior to Admission  Medication Sig Dispense Refill Last Dose   Cholecalciferol (VITAMIN D3) 5000 UNITS TABS Take 5,000 Units by mouth daily.    03/14/2022   HYDROcodone-acetaminophen (NORCO/VICODIN) 5-325 MG tablet Take 1 tablet by mouth every 6 (six) hours as needed for moderate pain. 120 tablet 0 03/14/2022   levothyroxine (SYNTHROID) 125 MCG tablet Take 125 mcg by mouth daily before breakfast.   03/14/2022   warfarin (COUMADIN) 2.5 MG tablet Take 7.'5mg'$  daily (Patient taking differently: Take 2.5 mg by mouth as directed. Take along with 5 mg tablet=7.5 mg) 30 tablet 3 03/13/2022 at 1930   warfarin (COUMADIN) 5 MG tablet Take 7.'5mg'$  daily (Patient taking differently: Take 5 mg by mouth as directed. Take along with 2.5 mg tablet=7.5 mg) 90 tablet 1 03/13/2022 at 1930   azelastine (ASTELIN) 0.1 % nasal spray Place 2 sprays into both nostrils 2 (two) times daily. (Patient not taking: Reported on 03/14/2022) 30 mL 12 Not Taking  doxycycline (VIBRA-TABS) 100 MG tablet Take 1 tablet (100 mg total) by mouth 2 (two) times daily. (Patient not taking: Reported on 03/14/2022) 20 tablet 0 Completed Course   ketoconazole (NIZORAL) 2 % cream Apply 1 Application topically daily. (Patient not taking: Reported on 03/14/2022) 15 g 0 Completed Course   lisinopril (PRINIVIL) 10 MG tablet Take 1 tablet (10 mg total) by mouth daily. 30 tablet 1    ondansetron (ZOFRAN) 8 MG tablet Take 1 tablet (8 mg total) by mouth every 8 (eight) hours as needed for nausea or vomiting. (Patient not taking: Reported on 03/14/2022) 90 tablet 0 Completed Course   silver sulfADIAZINE (SILVADENE) 1 % cream Apply a thin layer under affected breast once a day  (Patient not taking: Reported on 03/14/2022) 50 g 0 Completed Course    Assessment: 81 yo F on warfarin PTA for hx DVT. S/p fall w/ left hip pain. Pharmacy to manage warfarin dosing.  Per ortho: Non displaced left proximal peri-prosthetic femur fracture. Plan: Non operative management  PTA warfarin dose: told to take 10 mg 10/13 - 10/15 then take 7.5 qd starting 10/16. LD 7.5 mg 10/15 @ 1930 03/16/2022  INR 2.2 today> remains therapeutic.  Has missed last 2 days b/c it was held pending ortho eval. CBC WNL No bleeding reported  Goal of Therapy:  INR 2-3 Monitor platelets by anticoagulation protocol: Yes   Plan:  Warfarin 10 mg po x 1 dose today Daily INR  Eudelia Bunch, Pharm.D 03/16/2022 12:46 PM

## 2022-03-16 NOTE — Assessment & Plan Note (Signed)
   Originally diagnosed in 2014  On lifelong anticoagulation with Coumadin  Resuming Coumadin regimen with target INR of 2-3 thing as outpatient will not proceed with surgery
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
## 2022-03-16 NOTE — Assessment & Plan Note (Signed)
.   Resume home regimen of Synthroid 

## 2022-03-17 DIAGNOSIS — R531 Weakness: Secondary | ICD-10-CM | POA: Diagnosis not present

## 2022-03-17 DIAGNOSIS — K219 Gastro-esophageal reflux disease without esophagitis: Secondary | ICD-10-CM | POA: Diagnosis not present

## 2022-03-17 DIAGNOSIS — R41841 Cognitive communication deficit: Secondary | ICD-10-CM | POA: Diagnosis not present

## 2022-03-17 DIAGNOSIS — Z7901 Long term (current) use of anticoagulants: Secondary | ICD-10-CM | POA: Diagnosis not present

## 2022-03-17 DIAGNOSIS — I890 Lymphedema, not elsewhere classified: Secondary | ICD-10-CM | POA: Diagnosis not present

## 2022-03-17 DIAGNOSIS — F32A Depression, unspecified: Secondary | ICD-10-CM | POA: Diagnosis not present

## 2022-03-17 DIAGNOSIS — R110 Nausea: Secondary | ICD-10-CM | POA: Diagnosis not present

## 2022-03-17 DIAGNOSIS — M9702XD Periprosthetic fracture around internal prosthetic left hip joint, subsequent encounter: Secondary | ICD-10-CM | POA: Diagnosis not present

## 2022-03-17 DIAGNOSIS — Z79811 Long term (current) use of aromatase inhibitors: Secondary | ICD-10-CM | POA: Diagnosis not present

## 2022-03-17 DIAGNOSIS — S72142A Displaced intertrochanteric fracture of left femur, initial encounter for closed fracture: Secondary | ICD-10-CM | POA: Diagnosis not present

## 2022-03-17 DIAGNOSIS — M6281 Muscle weakness (generalized): Secondary | ICD-10-CM | POA: Diagnosis not present

## 2022-03-17 DIAGNOSIS — M25552 Pain in left hip: Secondary | ICD-10-CM | POA: Diagnosis not present

## 2022-03-17 DIAGNOSIS — E039 Hypothyroidism, unspecified: Secondary | ICD-10-CM | POA: Diagnosis not present

## 2022-03-17 DIAGNOSIS — Z515 Encounter for palliative care: Secondary | ICD-10-CM | POA: Diagnosis not present

## 2022-03-17 DIAGNOSIS — Z9181 History of falling: Secondary | ICD-10-CM | POA: Diagnosis not present

## 2022-03-17 DIAGNOSIS — Z923 Personal history of irradiation: Secondary | ICD-10-CM | POA: Diagnosis not present

## 2022-03-17 DIAGNOSIS — C50411 Malignant neoplasm of upper-outer quadrant of right female breast: Secondary | ICD-10-CM | POA: Diagnosis not present

## 2022-03-17 DIAGNOSIS — K5903 Drug induced constipation: Secondary | ICD-10-CM | POA: Diagnosis not present

## 2022-03-17 DIAGNOSIS — Z5111 Encounter for antineoplastic chemotherapy: Secondary | ICD-10-CM | POA: Diagnosis not present

## 2022-03-17 DIAGNOSIS — C7951 Secondary malignant neoplasm of bone: Secondary | ICD-10-CM | POA: Diagnosis not present

## 2022-03-17 DIAGNOSIS — G893 Neoplasm related pain (acute) (chronic): Secondary | ICD-10-CM | POA: Diagnosis not present

## 2022-03-17 DIAGNOSIS — Z7401 Bed confinement status: Secondary | ICD-10-CM | POA: Diagnosis not present

## 2022-03-17 DIAGNOSIS — R2681 Unsteadiness on feet: Secondary | ICD-10-CM | POA: Diagnosis not present

## 2022-03-17 DIAGNOSIS — Z96642 Presence of left artificial hip joint: Secondary | ICD-10-CM | POA: Diagnosis not present

## 2022-03-17 DIAGNOSIS — M8580 Other specified disorders of bone density and structure, unspecified site: Secondary | ICD-10-CM | POA: Diagnosis not present

## 2022-03-17 DIAGNOSIS — I82542 Chronic embolism and thrombosis of left tibial vein: Secondary | ICD-10-CM | POA: Diagnosis not present

## 2022-03-17 DIAGNOSIS — I10 Essential (primary) hypertension: Secondary | ICD-10-CM | POA: Diagnosis not present

## 2022-03-17 DIAGNOSIS — Z170 Estrogen receptor positive status [ER+]: Secondary | ICD-10-CM | POA: Diagnosis not present

## 2022-03-17 DIAGNOSIS — Z86718 Personal history of other venous thrombosis and embolism: Secondary | ICD-10-CM | POA: Diagnosis not present

## 2022-03-17 DIAGNOSIS — S72142D Displaced intertrochanteric fracture of left femur, subsequent encounter for closed fracture with routine healing: Secondary | ICD-10-CM | POA: Diagnosis not present

## 2022-03-17 DIAGNOSIS — Z760 Encounter for issue of repeat prescription: Secondary | ICD-10-CM | POA: Diagnosis not present

## 2022-03-17 DIAGNOSIS — R2689 Other abnormalities of gait and mobility: Secondary | ICD-10-CM | POA: Diagnosis not present

## 2022-03-17 DIAGNOSIS — M84459A Pathological fracture, hip, unspecified, initial encounter for fracture: Secondary | ICD-10-CM | POA: Diagnosis not present

## 2022-03-17 DIAGNOSIS — E559 Vitamin D deficiency, unspecified: Secondary | ICD-10-CM | POA: Diagnosis not present

## 2022-03-17 LAB — COMPREHENSIVE METABOLIC PANEL
ALT: 10 U/L (ref 0–44)
AST: 13 U/L — ABNORMAL LOW (ref 15–41)
Albumin: 3.2 g/dL — ABNORMAL LOW (ref 3.5–5.0)
Alkaline Phosphatase: 90 U/L (ref 38–126)
Anion gap: 4 — ABNORMAL LOW (ref 5–15)
BUN: 23 mg/dL (ref 8–23)
CO2: 27 mmol/L (ref 22–32)
Calcium: 10.3 mg/dL (ref 8.9–10.3)
Chloride: 107 mmol/L (ref 98–111)
Creatinine, Ser: 1.09 mg/dL — ABNORMAL HIGH (ref 0.44–1.00)
GFR, Estimated: 51 mL/min — ABNORMAL LOW (ref >60)
Glucose, Bld: 106 mg/dL — ABNORMAL HIGH (ref 70–99)
Potassium: 3.9 mmol/L (ref 3.5–5.1)
Sodium: 138 mmol/L (ref 135–145)
Total Bilirubin: 0.5 mg/dL (ref 0.3–1.2)
Total Protein: 6.1 g/dL — ABNORMAL LOW (ref 6.5–8.1)

## 2022-03-17 LAB — CBC WITH DIFFERENTIAL/PLATELET
Abs Immature Granulocytes: 0.02 10*3/uL (ref 0.00–0.07)
Basophils Absolute: 0.1 10*3/uL (ref 0.0–0.1)
Basophils Relative: 1 %
Eosinophils Absolute: 0.5 10*3/uL (ref 0.0–0.5)
Eosinophils Relative: 6 %
HCT: 38.7 % (ref 36.0–46.0)
Hemoglobin: 12.1 g/dL (ref 12.0–15.0)
Immature Granulocytes: 0 %
Lymphocytes Relative: 26 %
Lymphs Abs: 1.9 10*3/uL (ref 0.7–4.0)
MCH: 28.4 pg (ref 26.0–34.0)
MCHC: 31.3 g/dL (ref 30.0–36.0)
MCV: 90.8 fL (ref 80.0–100.0)
Monocytes Absolute: 1 10*3/uL (ref 0.1–1.0)
Monocytes Relative: 14 %
Neutro Abs: 3.9 10*3/uL (ref 1.7–7.7)
Neutrophils Relative %: 53 %
Platelets: 260 10*3/uL (ref 150–400)
RBC: 4.26 MIL/uL (ref 3.87–5.11)
RDW: 15.2 % (ref 11.5–15.5)
WBC: 7.5 10*3/uL (ref 4.0–10.5)
nRBC: 0 % (ref 0.0–0.2)

## 2022-03-17 LAB — PROTIME-INR
INR: 2.2 — ABNORMAL HIGH (ref 0.8–1.2)
Prothrombin Time: 24 seconds — ABNORMAL HIGH (ref 11.4–15.2)

## 2022-03-17 LAB — MAGNESIUM: Magnesium: 2.2 mg/dL (ref 1.7–2.4)

## 2022-03-17 MED ORDER — OXYCODONE HCL 5 MG PO TABS: 5.0000 mg | ORAL_TABLET | Freq: Four times a day (QID) | ORAL | 0 refills | Status: DC | PRN

## 2022-03-17 MED ORDER — ACETAMINOPHEN 325 MG PO TABS: 650.0000 mg | ORAL_TABLET | Freq: Two times a day (BID) | ORAL | 1 refills | Status: DC

## 2022-03-17 MED ORDER — WARFARIN SODIUM 5 MG PO TABS: 10.0000 mg | ORAL_TABLET | Freq: Once | ORAL | Status: DC

## 2022-03-17 MED ORDER — OXYCODONE HCL 10 MG PO TABS: 10.0000 mg | ORAL_TABLET | Freq: Four times a day (QID) | ORAL | 0 refills | Status: DC | PRN

## 2022-03-17 MED ORDER — OXYCODONE HCL 5 MG PO TABS: 5.0000 mg | ORAL_TABLET | Freq: Four times a day (QID) | ORAL | 0 refills | Status: AC | PRN

## 2022-03-17 MED ORDER — NALOXONE HCL 0.4 MG/ML IJ SOLN: 0.4000 mg | INTRAMUSCULAR | 0 refills | Status: DC | PRN

## 2022-03-17 NOTE — Progress Notes (Signed)
ANTICOAGULATION CONSULT NOTE - Initial Consult  Pharmacy Consult for warfarin Indication: hx DVT  Allergies  Allergen Reactions   Anesthetics, Amide Hypertension   Benadryl [Diphenhydramine Hcl] Other (See Comments)    Dizziness   Carbocaine [Mepivacaine Hcl] Hypertension   Codeine Other (See Comments)    Dizziness   Epinephrine Hypertension   Sulfa Antibiotics Other (See Comments)    dizziness   Diphenhydramine    Latex Other (See Comments)    Blisters in mouth   Penicillins Rash    Has patient had a PCN reaction causing immediate rash, facial/tongue/throat swelling, SOB or lightheadedness with hypotension: Unknown Has patient had a PCN reaction causing severe rash involving mucus membranes or skin necrosis: Unknown Has patient had a PCN reaction that required hospitalization: Unknown Has patient had a PCN reaction occurring within the last 10 years: Unknown If all of the above answers are "NO", then may proceed with Cephalosporin use.    Tramadol Other (See Comments)    Sedation.     Patient Measurements: Height: '5\' 4"'$  (162.6 cm) Weight: 99.8 kg (220 lb 0.3 oz) IBW/kg (Calculated) : 54.7  Vital Signs: Temp: 97.6 F (36.4 C) (10/19 0707) BP: 158/72 (10/19 0707) Pulse Rate: 76 (10/19 0707)  Labs: Recent Labs    03/14/22 2049 03/15/22 0018 03/15/22 0327 03/16/22 0332 03/16/22 0859 03/17/22 0336  HGB 12.7  --   --   --  13.3 12.1  HCT 40.2  --   --   --  42.1 38.7  PLT 311  --   --   --  253 260  LABPROT  --    < > 25.4* 24.5*  --  24.0*  INR  --    < > 2.3* 2.2*  --  2.2*  CREATININE 0.85  --   --   --  0.87 1.09*   < > = values in this interval not displayed.     Estimated Creatinine Clearance: 45.7 mL/min (A) (by C-G formula based on SCr of 1.09 mg/dL (H)).   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 S/p fall w/ left hip pain. Pharmacy to manage warfarin dosing.  Per ortho: Non displaced left proximal peri-prosthetic femur fracture. Plan: Non operative management  PTA warfarin dose: told to take 10 mg 10/13 - 10/15 then take 7.5 qd starting 10/16.  -Last dose PTA: 7.5 mg 10/15 @ 1930 -Pt missed doses on 10/16 and 10/17 while warfarin was on hold for potential need for surgery  Today, 03/17/22 INR = 2.2 remains therapeutic CBC: WNL, stable Diet: Heart healthy, 100% meal intake charted No significant drug interactions  Goal of Therapy:  INR 2-3 Monitor platelets by anticoagulation protocol: Yes   Plan:  Warfarin 10 mg PO once again today for a slightly boosted dose after holding warfarin for 48 hours INR daily while inpatient  At this time, if patient  discharges, would recommend resuming home dose of 7.5 mg PO daily starting 10/20 with INR check early next week to ensure therapeutic.  Lenis Noon, PharmD 03/17/22 10:30 AM

## 2022-03-17 NOTE — Consult Note (Signed)
   [facility] Inpatient Consult   03/17/2022  Sullivan 01-26-1941 [id number]  [place] [employer] [ACO] Patient: Medicare [facility] Liaison remote coverage for patient at [facility]  Primary Care Provider:  [name], MD, [place] at [facility] is listed for the Transition Of Care follow up  Call patient's room however no answer, chart review reveals patient is for rehab. Will alert inpatient TOC team of [facility] following at facility for community return needs.  If the patient goes to a [facility] affiliated facility then, patient can be followed by [facility] PAC RN with traditional Medicare and approved Medicare Advantage plans.  Chart review reveals patient is scheduled  for [facility]  Plan:   [facility] RN can follow for any known or needs for transitional care needs for returning to post facility care or complex disease management. Will notify of transition  For questions or referrals, please contact:   Natividad Brood, RN BSN [facility] [facility] Liaison  [phone] business mobile phone Toll free office [phone]  Fax number: [phone] [email or web address]

## 2022-03-17 NOTE — TOC Transition Note (Signed)
Transition of Care [facility]) - CM/SW Discharge Note   Patient Details  Name: Alicia Vasquez [facility] MRN: [id number] Date of Birth: [birth year]  Transition of Care [facility]) CM/SW Contact:  Lennart Pall, LCSW Phone Number: 03/17/2022, 11:43 AM   Clinical Narrative:    Met with pt to review SNF bed offers and pt has accepted a bed at [facility].  Facility able to admit pt today.  MD has medically cleared.  Pt asks that I notify son, Alicia Vasquez, and have done so.  PTAR called at 11:40 am.  RN to call report to [phone].  No further TOC needs.   Final next level of care: Skilled Nursing Facility Barriers to Discharge: Barriers Resolved   Patient Goals and CMS Choice Patient states their goals for this hospitalization and ongoing recovery are:: return home following SNF rehab CMS Medicare.gov Compare Post Acute Care list provided to:: Patient Choice offered to / list presented to : Patient  Discharge Placement   Existing PASRR number confirmed : 03/16/22          Patient chooses bed at: [facility] Patient to be transferred to facility by: [place] Name of family member notified: son, Alicia Vasquez Patient and family notified of of transfer: 03/17/22  Discharge Plan and Services In-house Referral: Clinical Social Work   Post Acute Care Choice: [place]          DME Arranged: N/A DME Agency: NA                  Social Determinants of Health (SDOH) Interventions Food Insecurity Interventions: Intervention Not Indicated Housing Interventions: Intervention Not Indicated Transportation Interventions: Intervention Not Indicated Utilities Interventions: Intervention Not Indicated   Readmission Risk Interventions    03/16/2022   11:19 AM  Readmission Risk Prevention Plan  Post Dischage Appt Complete  Medication Screening Complete  Transportation Screening Complete

## 2022-03-17 NOTE — Plan of Care (Signed)
Plan of care reviewed and discussed. °

## 2022-03-17 NOTE — Plan of Care (Signed)
Discharge Report called to [place], at [facility].

## 2022-03-17 NOTE — Discharge Summary (Addendum)
Physician Discharge Summary   Patient: Zenith Kercheval Guandique MRN: [id number] DOB: 05-Dec-1940  Admit date:     03/14/2022  Discharge date: 03/17/22  Discharge Physician: Vernelle Emerald   PCP: [name], MD   Recommendations at discharge:   Weightbearing as tolerated using a walker Patient to follow-up with his primary care provider Dr. Dimas Chyle in 1 to 2 weeks or first available appointment. Patient to follow-up with [name] with orthopedic surgery in approximately 4 weeks with [facility].  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 [facility] Course: Elbia Paro Elenes is a 81 y.o. 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    CT of left hip showing an acute to subacute fracture within the anterior distal intertrochanteric region of the proximal left femur extending within 5 mm of the proximal femoral stem hardware.  X-rays of left ankle and knee negative for fracture or dislocation despite patient's complaints of significant pain in these areas with associated tenderness.  Hospitalist group was then called to assess the patient for admission to the hospital.  Patient was evaluated by [name] with orthopedic surgery who recommended conservative  management.  He recommended weightbearing as tolerated using a walker and close observation.  He recommended outpatient follow-up with orthopedic surgery in 4 weeks for follow-up x-ray and evaluation.  Throughout the remainder of the hospitalization patient was placed on a regimen of scheduled Tylenol twice daily with as needed oral oxycodone as needed for breakthrough pain.  This worked well and patient was able to participate with physical therapy.   Arrangements were made for the patient to be discharged to a skilled nursing facility for continued skilled rehabilitation and close monitoring.  Patient was discharged on 03/17/2022 in fair condition.        Pain control - [employer] Controlled Substance Reporting System database was reviewed. and patient was instructed, not to drive, operate heavy machinery, perform activities at heights, swimming or participation in water activities or provide baby-sitting services while on Pain, Sleep and Anxiety Medications; until their outpatient Physician has advised to do so again. Also recommended to not to take more than prescribed Pain, Sleep and Anxiety Medications.  Consultants: [name] with Orthopedic surgery Procedures performed: None  Disposition: Skilled nursing facility Diet recommendation:  Discharge Diet Orders (From admission, onward)     Start     Ordered   03/17/22 0000  Diet - low sodium heart healthy        03/17/22 1109           Cardiac diet DISCHARGE MEDICATION: Allergies as of 03/17/2022       Reactions   Anesthetics, Amide Hypertension   Benadryl [diphenhydramine Hcl] Other (See Comments)   Dizziness   Carbocaine [mepivacaine Hcl] Hypertension   Codeine Other (See Comments)   Dizziness   Epinephrine Hypertension  Sulfa Antibiotics Other (See Comments)   dizziness   Diphenhydramine    Latex Other (See Comments)   Blisters in mouth   Penicillins Rash   Has patient had a PCN reaction causing immediate rash,  facial/tongue/throat swelling, SOB or lightheadedness with hypotension: Unknown Has patient had a PCN reaction causing severe rash involving mucus membranes or skin necrosis: Unknown Has patient had a PCN reaction that required hospitalization: Unknown Has patient had a PCN reaction occurring within the last 10 years: Unknown If all of the above answers are "NO", then may proceed with Cephalosporin use.   Tramadol Other (See Comments)   Sedation.         Medication List     STOP taking these medications    azelastine 0.1 % nasal spray Commonly known as: ASTELIN   doxycycline 100 MG tablet Commonly known as: VIBRA-TABS   HYDROcodone-acetaminophen 5-325 MG tablet Commonly known as: NORCO/VICODIN   ketoconazole 2 % cream Commonly known as: NIZORAL   silver sulfADIAZINE 1 % cream Commonly known as: Silvadene       TAKE these medications    acetaminophen 325 MG tablet Commonly known as: TYLENOL Take 2 tablets (650 mg total) by mouth 2 (two) times daily.   lisinopril 10 MG tablet Commonly known as: Prinivil Take 1 tablet (10 mg total) by mouth daily.   naloxone 0.4 MG/ML injection Commonly known as: NARCAN Inject 1 mL (0.4 mg total) into the muscle as needed.   ondansetron 8 MG tablet Commonly known as: ZOFRAN Take 1 tablet (8 mg total) by mouth every 8 (eight) hours as needed for nausea or vomiting.   oxyCODONE 5 MG immediate release tablet Commonly known as: Roxicodone Take 1-2 tablets (5-10 mg total) by mouth every 6 (six) hours as needed for up to 3 days for severe pain or moderate pain.   Synthroid 125 MCG tablet Generic drug: levothyroxine Take 125 mcg by mouth daily before breakfast.   Vitamin D3 125 MCG (5000 UT) Tabs Take 5,000 Units by mouth daily.   warfarin 5 MG tablet Commonly known as: COUMADIN Take as directed. If you are unsure how to take this medication, talk to your nurse or doctor. Original instructions: Take 7.'5mg'$  daily What changed:   how much to take how to take this when to take this additional instructions   warfarin 2.5 MG tablet Commonly known as: COUMADIN Take as directed. If you are unsure how to take this medication, talk to your nurse or doctor. Original instructions: Take 7.'5mg'$  daily What changed:  how much to take how to take this when to take this additional instructions        Contact information for follow-up providers     Paralee Cancel, MD Follow up in 4 week(s).   Specialty: Orthopedic Surgery Why: For clinical and radiographic follow up Contact information: [address] [place] [address] [phone]         [name], MD Follow up in 1 week(s).   Specialty: Family Medicine Why: Follow up appointment in 1-2 weeks. Contact information: [place] [address] [phone]              Contact information for after-discharge care     Destination     [facility] Preferred SNF .   Service: Skilled Nursing Contact information: [address] [place] [phone]  Discharge Exam: Filed Weights   03/15/22 0112  Weight: 99.8 kg   Constitutional: Awake alert and oriented x3, no associated distress.   Respiratory: clear to auscultation bilaterally, no wheezing, no crackles. Normal respiratory effort. No accessory muscle use.  Cardiovascular: Regular rate and rhythm, no murmurs / rubs / gallops. No extremity edema. 2+ pedal pulses. No carotid bruits.  Abdomen: Abdomen is soft and nontender.  No evidence of intra-abdominal masses.  Positive bowel sounds noted in all quadrants.   Musculoskeletal: Notable tenderness of the left foot leg and thigh without any associated deformity.   Normal muscle tone.       Condition at discharge: fair  The results of significant diagnostics from this hospitalization (including imaging, microbiology, ancillary and laboratory)  are listed below for reference.   Imaging Studies: DG Chest 1 View  Result Date: 03/14/2022 CLINICAL DATA:  Fall, preop EXAM: CHEST  1 VIEW COMPARISON:  05/19/2021 FINDINGS: Heart and mediastinal contours are within normal limits. No focal opacities or effusions. No acute bony abnormality. IMPRESSION: No active cardiopulmonary disease. Electronically Signed   By: Rolm Baptise M.D.   On: 03/14/2022 21:11   CT Hip Left Wo Contrast  Result Date: 03/14/2022 CLINICAL DATA:  Hip trauma. Fracture suspected. Fell 5 days ago. Left hip and leg pain. EXAM: CT OF THE LEFT HIP WITHOUT CONTRAST TECHNIQUE: Multidetector CT imaging of the left hip was performed according to the standard protocol. Multiplanar CT image reconstructions were also generated. RADIATION DOSE REDUCTION: This exam was performed according to the departmental dose-optimization program which includes automated exposure control, adjustment of the mA and/or kV according to patient size and/or use of iterative reconstruction technique. COMPARISON:  Pelvis and left hip radiographs 03/14/2022 and 08/18/2021; CT chest, abdomen, and pelvis 07/28/2021 FINDINGS: Bones/Joint/Cartilage Postsurgical changes are again seen of total left hip arthroplasty. There is again associated streak artifact that limits evaluation of adjacent bony detail. The distal tip of the femoral stem is not imaged. There are again proximal left femoral diaphyseal cerclage wires. Within the limitations of diffuse decreased bone mineralization, there appears to be new oblique linear lucency within the anterior distal intertrochanteric region suspicious for an acute fracture (sagittal series 7 images 80 through 85, coronal series 6 images 89 through 94). High-grade heterotopic bone formation is again seen superior to the greater trochanter and superior to the femoroacetabular joint. Mild-to-moderate pubic symphysis joint space narrowing and peripheral osteophytosis. Ligaments Suboptimally  assessed by CT. Muscles and Tendons Chronic, unchanged moderate fatty infiltration of the left gluteus minimus and medius muscles. Soft tissues No definite hematoma is seen, within limitations of diffuse streak artifact around the hip prosthesis hardware. Mild-to-moderate distal descending colon and sigmoid colon diverticulosis. IMPRESSION: 1. Redemonstration of total left hip arthroplasty. The previously seen proximal femoral diaphyseal cerclage wires are partially imaged. 2. There is new linear lucency indicating an acute to subacute fracture within the anterior distal intertrochanteric region of the proximal left femur, extending within 5 mm of the proximal femoral stem hardware. Electronically Signed   By: Yvonne Kendall M.D.   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 Similar lucency about the intratrochanteric femoral prosthetic hardware. Diffuse demineralization more  severe demineralization of the left proximal femur. No definite displaced fracture. No evidence of dislocation. 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 No definite displaced fracture. Demineralization of the left femur limits evaluation for nondisplaced fracture, if there is continued clinical concern recommend further evaluation with cross-sectional imaging. Electronically Signed   By: Yetta Glassman M.D.   On: 03/14/2022 18:23   DG Knee Complete 4 Views Left  Result Date: 03/14/2022 CLINICAL DATA:  Fall. EXAM: LEFT KNEE - COMPLETE 4+ VIEW COMPARISON:  None Available. FINDINGS: The bones are diffusely osteopenic. There is no fracture, dislocation or joint effusion. AP and lateral  views are limited secondary to patient positioning. There is moderate tricompartmental degenerative change with osteophyte formation and joint space narrowing. Orthopedic hardware is partially visualized in the mid femur. IMPRESSION: 1. No acute fracture or dislocation. 2. Osteopenia. 3. Moderate tricompartmental degenerative change. Electronically Signed   By: Ronney Asters M.D.   On: 03/14/2022 18:19   DG Ankle Complete Left  Result Date: 03/14/2022 CLINICAL DATA:  Fall EXAM: LEFT ANKLE COMPLETE - 3+ VIEW COMPARISON:  None Available. FINDINGS: The bones are osteopenic. There is no evidence of fracture, dislocation, or joint effusion. There is no evidence of arthropathy or other focal bone abnormality. There is some soft tissue swelling surrounding the ankle. There is some nonspecific skin calcifications of the posterior ankle. IMPRESSION: 1. Osteopenia. Soft tissue swelling. No evidence of fracture or dislocation. Electronically Signed   By: Ronney Asters M.D.   On: 03/14/2022 18:17    Microbiology: Results for orders placed or performed in visit on 02/04/22  Urine Culture     Status: Abnormal   Collection Time: 02/04/22 12:44 PM   Specimen: Urine, Clean Catch  Result Value Ref Range Status   Specimen Description   Final    URINE, CLEAN CATCH Performed at [facility], [place] [address] [place], [place] [address]    Special Requests   Final    NONE Performed at [facility], [place] [address] [place], [place] [address]    Culture MULTIPLE SPECIES PRESENT, SUGGEST RECOLLECTION (A)  Final   Report Status 02/05/2022 FINAL  Final   *Note: Due to a large number of results and/or encounters for the requested time period, some results have not been displayed. A complete set of results can be found in Results Review.    Labs: CBC: Recent Labs  Lab 03/14/22 2049 03/16/22 0859 03/17/22 0336  WBC 9.0 6.9 7.5  NEUTROABS 5.1 3.7 3.9  HGB 12.7  13.3 12.1  HCT 40.2 42.1 38.7  MCV 89.1 91.1 90.8  PLT 311 253 654   Basic Metabolic Panel: Recent Labs  Lab 03/14/22 2049 03/16/22 0859 03/17/22 0336  NA 140 141 138  K 3.8 3.8 3.9  CL 107 108 107  CO2 '27 27 27  '$ GLUCOSE 103* 96 106*  BUN '16 19 23  '$ CREATININE 0.85 0.87 1.09*  CALCIUM 10.5* 10.6* 10.3  MG  --  2.2 2.2   Liver Function Tests: Recent Labs  Lab 03/14/22 2049 03/16/22 0859 03/17/22 0336  AST 15 15 13*  ALT '12 11 10  '$ ALKPHOS 105 100 90  BILITOT 0.5 0.6 0.5  PROT 6.9 6.5 6.1*  ALBUMIN 3.7 3.4* 3.2*   CBG: No results for input(s): "GLUCAP" in the last 168 hours.  Discharge time spent: greater than 30 minutes.  Signed: Vernelle Emerald, MD [facility] 03/17/2022

## 2022-03-18 DIAGNOSIS — Z86718 Personal history of other venous thrombosis and embolism: Secondary | ICD-10-CM | POA: Diagnosis not present

## 2022-03-18 DIAGNOSIS — M9702XD Periprosthetic fracture around internal prosthetic left hip joint, subsequent encounter: Secondary | ICD-10-CM | POA: Diagnosis not present

## 2022-03-18 DIAGNOSIS — E039 Hypothyroidism, unspecified: Secondary | ICD-10-CM | POA: Diagnosis not present

## 2022-03-18 DIAGNOSIS — I10 Essential (primary) hypertension: Secondary | ICD-10-CM | POA: Diagnosis not present

## 2022-03-18 LAB — MISC LABCORP TEST (SEND OUT): Labcorp test code: 81950

## 2022-03-21 DIAGNOSIS — Z9181 History of falling: Secondary | ICD-10-CM | POA: Diagnosis not present

## 2022-03-21 DIAGNOSIS — E039 Hypothyroidism, unspecified: Secondary | ICD-10-CM | POA: Diagnosis not present

## 2022-03-21 DIAGNOSIS — C7951 Secondary malignant neoplasm of bone: Secondary | ICD-10-CM | POA: Diagnosis not present

## 2022-03-21 DIAGNOSIS — I82542 Chronic embolism and thrombosis of left tibial vein: Secondary | ICD-10-CM | POA: Diagnosis not present

## 2022-03-21 DIAGNOSIS — M6281 Muscle weakness (generalized): Secondary | ICD-10-CM | POA: Diagnosis not present

## 2022-03-21 DIAGNOSIS — I10 Essential (primary) hypertension: Secondary | ICD-10-CM | POA: Diagnosis not present

## 2022-03-21 DIAGNOSIS — Z170 Estrogen receptor positive status [ER+]: Secondary | ICD-10-CM | POA: Diagnosis not present

## 2022-03-21 DIAGNOSIS — M9702XD Periprosthetic fracture around internal prosthetic left hip joint, subsequent encounter: Secondary | ICD-10-CM | POA: Diagnosis not present

## 2022-03-23 ENCOUNTER — Other Ambulatory Visit: Payer: Self-pay | Admitting: *Deleted

## 2022-03-23 NOTE — Patient Outreach (Signed)
Alicia Vasquez resides in [facility]. Screening for potential [facility] care coordination needs as benefit of insurance plan and PCP.   Facility site visit to [facility]. Went to bedside to speak with Alicia Vasquez. However, she was off the unit with therapy.   Will follow up at later time.   Probation officer attended [employer]. Anticipated dc plan is to return home.   Will continue to follow.   Alicia Rolling, MSN, RN,BSN [place] Acute Care Coordinator [phone] (Direct dial)

## 2022-03-24 DIAGNOSIS — E039 Hypothyroidism, unspecified: Secondary | ICD-10-CM | POA: Diagnosis not present

## 2022-03-24 DIAGNOSIS — C7951 Secondary malignant neoplasm of bone: Secondary | ICD-10-CM | POA: Diagnosis not present

## 2022-03-24 DIAGNOSIS — Z9181 History of falling: Secondary | ICD-10-CM | POA: Diagnosis not present

## 2022-03-24 DIAGNOSIS — Z170 Estrogen receptor positive status [ER+]: Secondary | ICD-10-CM | POA: Diagnosis not present

## 2022-03-24 DIAGNOSIS — I82542 Chronic embolism and thrombosis of left tibial vein: Secondary | ICD-10-CM | POA: Diagnosis not present

## 2022-03-28 DIAGNOSIS — C7951 Secondary malignant neoplasm of bone: Secondary | ICD-10-CM | POA: Diagnosis not present

## 2022-03-28 DIAGNOSIS — M6281 Muscle weakness (generalized): Secondary | ICD-10-CM | POA: Diagnosis not present

## 2022-03-28 DIAGNOSIS — Z170 Estrogen receptor positive status [ER+]: Secondary | ICD-10-CM | POA: Diagnosis not present

## 2022-03-28 DIAGNOSIS — I82542 Chronic embolism and thrombosis of left tibial vein: Secondary | ICD-10-CM | POA: Diagnosis not present

## 2022-03-28 DIAGNOSIS — I10 Essential (primary) hypertension: Secondary | ICD-10-CM | POA: Diagnosis not present

## 2022-03-28 DIAGNOSIS — M9702XD Periprosthetic fracture around internal prosthetic left hip joint, subsequent encounter: Secondary | ICD-10-CM | POA: Diagnosis not present

## 2022-03-28 DIAGNOSIS — Z86718 Personal history of other venous thrombosis and embolism: Secondary | ICD-10-CM | POA: Diagnosis not present

## 2022-03-28 DIAGNOSIS — Z9181 History of falling: Secondary | ICD-10-CM | POA: Diagnosis not present

## 2022-03-28 DIAGNOSIS — E039 Hypothyroidism, unspecified: Secondary | ICD-10-CM | POA: Diagnosis not present

## 2022-03-29 ENCOUNTER — Other Ambulatory Visit: Payer: Self-pay | Admitting: *Deleted

## 2022-03-29 NOTE — Patient Outreach (Signed)
[place] Coordinator follow up. Alicia Vasquez resides in [facility]. Screening for potential [facility] care coordination services as benefit of insurance plan and PCP.  Update received from [facility] social worker indicating Alicia Vasquez has been staying in extended stay temporarily. Meeting to be set up soon.  Telephone call made to Alicia Vasquez [phone] to discuss [facility] care coordination services. No answer. HIPAA compliant voicemail message left to request return call.   Will continue to follow.  [name], MSN, RN,BSN [place] Acute Care Coordinator [phone] (Direct dial)

## 2022-03-30 DIAGNOSIS — F32A Depression, unspecified: Secondary | ICD-10-CM | POA: Diagnosis not present

## 2022-03-30 DIAGNOSIS — Z86718 Personal history of other venous thrombosis and embolism: Secondary | ICD-10-CM | POA: Diagnosis not present

## 2022-03-30 DIAGNOSIS — M9702XD Periprosthetic fracture around internal prosthetic left hip joint, subsequent encounter: Secondary | ICD-10-CM | POA: Diagnosis not present

## 2022-03-31 DIAGNOSIS — C7951 Secondary malignant neoplasm of bone: Secondary | ICD-10-CM | POA: Diagnosis not present

## 2022-03-31 DIAGNOSIS — I82542 Chronic embolism and thrombosis of left tibial vein: Secondary | ICD-10-CM | POA: Diagnosis not present

## 2022-03-31 DIAGNOSIS — Z9181 History of falling: Secondary | ICD-10-CM | POA: Diagnosis not present

## 2022-03-31 DIAGNOSIS — E039 Hypothyroidism, unspecified: Secondary | ICD-10-CM | POA: Diagnosis not present

## 2022-03-31 DIAGNOSIS — Z170 Estrogen receptor positive status [ER+]: Secondary | ICD-10-CM | POA: Diagnosis not present

## 2022-04-04 ENCOUNTER — Encounter: Payer: Self-pay | Admitting: Oncology

## 2022-04-04 DIAGNOSIS — C7951 Secondary malignant neoplasm of bone: Secondary | ICD-10-CM | POA: Diagnosis not present

## 2022-04-04 DIAGNOSIS — Z9181 History of falling: Secondary | ICD-10-CM | POA: Diagnosis not present

## 2022-04-04 DIAGNOSIS — I82542 Chronic embolism and thrombosis of left tibial vein: Secondary | ICD-10-CM | POA: Diagnosis not present

## 2022-04-04 DIAGNOSIS — Z170 Estrogen receptor positive status [ER+]: Secondary | ICD-10-CM | POA: Diagnosis not present

## 2022-04-04 DIAGNOSIS — E039 Hypothyroidism, unspecified: Secondary | ICD-10-CM | POA: Diagnosis not present

## 2022-04-04 NOTE — Telephone Encounter (Signed)
This RN returned call to pt who is current residing at [facility]- post d/c on 03/17/2022 from the hospital with noted fracture femur. Per Dr Alvan Dame plan is for conservative healing without current surgery planned.  Pt had INR on 10/19 with reading of 2.2 with current coumadin dose noted as 7.5 mg daily.  Alicia Vasquez is asking about the scheduled appt tomorrow at this office including faslodex injection.  This RN informed due to current issues and need for improved weight bearing and healing of her fracture- best to hold treatment at this time.  She is scheduled to see Dr Alvan Dame on 11/8 and has her next appt in this office on 05/03/2022.   This note will be forwarded to providers for review of communication.

## 2022-04-05 ENCOUNTER — Inpatient Hospital Stay: Payer: Medicare Other

## 2022-04-05 ENCOUNTER — Encounter: Payer: Medicare Other | Admitting: Nurse Practitioner

## 2022-04-05 ENCOUNTER — Inpatient Hospital Stay: Payer: Medicare Other | Admitting: Adult Health

## 2022-04-06 ENCOUNTER — Other Ambulatory Visit: Payer: Self-pay | Admitting: *Deleted

## 2022-04-06 DIAGNOSIS — Z96642 Presence of left artificial hip joint: Secondary | ICD-10-CM | POA: Diagnosis not present

## 2022-04-06 DIAGNOSIS — M25552 Pain in left hip: Secondary | ICD-10-CM | POA: Diagnosis not present

## 2022-04-06 NOTE — Patient Outreach (Signed)
[facility] Post- Acute Care Coordinator follow up. [name] resides in [facility]. Screening for potential [facility] care coordination needs as benefit of insurance plan and PCP.   Attended collaborative team meeting at [facility] today. [name] and son reside in an extended stay hotel. Therapy reports she is progressing. Off unit today at orthopedic appointment. SNF socia workerl reports she has been unable to reach son to discuss transition plans. [name] is her own responsible party however.   Will continue to follow.   [name], MSN, RN,BSN [place] Acute Care Coordinator [phone] (Direct dial)

## 2022-04-07 DIAGNOSIS — Z9181 History of falling: Secondary | ICD-10-CM | POA: Diagnosis not present

## 2022-04-07 DIAGNOSIS — C7951 Secondary malignant neoplasm of bone: Secondary | ICD-10-CM | POA: Diagnosis not present

## 2022-04-07 DIAGNOSIS — M9702XD Periprosthetic fracture around internal prosthetic left hip joint, subsequent encounter: Secondary | ICD-10-CM | POA: Diagnosis not present

## 2022-04-07 DIAGNOSIS — I10 Essential (primary) hypertension: Secondary | ICD-10-CM | POA: Diagnosis not present

## 2022-04-07 DIAGNOSIS — Z86718 Personal history of other venous thrombosis and embolism: Secondary | ICD-10-CM | POA: Diagnosis not present

## 2022-04-07 DIAGNOSIS — E039 Hypothyroidism, unspecified: Secondary | ICD-10-CM | POA: Diagnosis not present

## 2022-04-07 DIAGNOSIS — Z170 Estrogen receptor positive status [ER+]: Secondary | ICD-10-CM | POA: Diagnosis not present

## 2022-04-07 DIAGNOSIS — F32A Depression, unspecified: Secondary | ICD-10-CM | POA: Diagnosis not present

## 2022-04-07 DIAGNOSIS — I82542 Chronic embolism and thrombosis of left tibial vein: Secondary | ICD-10-CM | POA: Diagnosis not present

## 2022-04-11 ENCOUNTER — Other Ambulatory Visit: Payer: Medicare Other

## 2022-04-11 DIAGNOSIS — C7951 Secondary malignant neoplasm of bone: Secondary | ICD-10-CM | POA: Diagnosis not present

## 2022-04-11 DIAGNOSIS — I82542 Chronic embolism and thrombosis of left tibial vein: Secondary | ICD-10-CM | POA: Diagnosis not present

## 2022-04-11 DIAGNOSIS — Z170 Estrogen receptor positive status [ER+]: Secondary | ICD-10-CM | POA: Diagnosis not present

## 2022-04-11 DIAGNOSIS — E039 Hypothyroidism, unspecified: Secondary | ICD-10-CM | POA: Diagnosis not present

## 2022-04-11 DIAGNOSIS — Z9181 History of falling: Secondary | ICD-10-CM | POA: Diagnosis not present

## 2022-04-13 ENCOUNTER — Other Ambulatory Visit: Payer: Self-pay | Admitting: *Deleted

## 2022-04-13 NOTE — Patient Outreach (Signed)
[place] Coordinator follow up. Mrs. Behne resides in [facility]. Screening for potential [facility] care coordination services as benefit of insurance plan and PCP.   Facility site visit to [facility] today. Went to speak with Mrs. Gehling at beside. However, she was out of the room. Writer is yet to meet with Mrs. Moehle. She has been out of the room during writer's facility visits. Will attempt to visit on a different day and time of the week.   SNF social worker reports Mrs. Callicott continues to progress with therapy. Still has not spoke with son.  Will continue to follow for potential [facility] care coordination services.   [name], MSN, RN,BSN [place] Acute Care Coordinator [phone] (Direct dial)

## 2022-04-14 DIAGNOSIS — Z9181 History of falling: Secondary | ICD-10-CM | POA: Diagnosis not present

## 2022-04-14 DIAGNOSIS — C7951 Secondary malignant neoplasm of bone: Secondary | ICD-10-CM | POA: Diagnosis not present

## 2022-04-14 DIAGNOSIS — I82542 Chronic embolism and thrombosis of left tibial vein: Secondary | ICD-10-CM | POA: Diagnosis not present

## 2022-04-14 DIAGNOSIS — E039 Hypothyroidism, unspecified: Secondary | ICD-10-CM | POA: Diagnosis not present

## 2022-04-14 DIAGNOSIS — Z170 Estrogen receptor positive status [ER+]: Secondary | ICD-10-CM | POA: Diagnosis not present

## 2022-04-18 ENCOUNTER — Other Ambulatory Visit: Payer: Self-pay | Admitting: *Deleted

## 2022-04-18 DIAGNOSIS — Z9181 History of falling: Secondary | ICD-10-CM | POA: Diagnosis not present

## 2022-04-18 DIAGNOSIS — E039 Hypothyroidism, unspecified: Secondary | ICD-10-CM | POA: Diagnosis not present

## 2022-04-18 DIAGNOSIS — Z170 Estrogen receptor positive status [ER+]: Secondary | ICD-10-CM | POA: Diagnosis not present

## 2022-04-18 DIAGNOSIS — I82542 Chronic embolism and thrombosis of left tibial vein: Secondary | ICD-10-CM | POA: Diagnosis not present

## 2022-04-18 DIAGNOSIS — C7951 Secondary malignant neoplasm of bone: Secondary | ICD-10-CM | POA: Diagnosis not present

## 2022-04-18 NOTE — Patient Outreach (Signed)
[place] Coordinator follow up. Per [facility] Mrs. Mehringer  resides in [facility]. Screening for potential [facility] care coordination services as benefit of insurance plan and PCP.   Secure communication sent to SNF social worker to inquire about transition plans.   Will continue to follow.  [name], MSN, RN,BSN [place] Acute Care Coordinator [phone] (Direct dial)

## 2022-04-19 DIAGNOSIS — F32A Depression, unspecified: Secondary | ICD-10-CM | POA: Diagnosis not present

## 2022-04-19 DIAGNOSIS — M6281 Muscle weakness (generalized): Secondary | ICD-10-CM | POA: Diagnosis not present

## 2022-04-19 DIAGNOSIS — Z86718 Personal history of other venous thrombosis and embolism: Secondary | ICD-10-CM | POA: Diagnosis not present

## 2022-04-19 DIAGNOSIS — M9702XD Periprosthetic fracture around internal prosthetic left hip joint, subsequent encounter: Secondary | ICD-10-CM | POA: Diagnosis not present

## 2022-04-25 DIAGNOSIS — C7951 Secondary malignant neoplasm of bone: Secondary | ICD-10-CM | POA: Diagnosis not present

## 2022-04-25 DIAGNOSIS — Z9181 History of falling: Secondary | ICD-10-CM | POA: Diagnosis not present

## 2022-04-25 DIAGNOSIS — E039 Hypothyroidism, unspecified: Secondary | ICD-10-CM | POA: Diagnosis not present

## 2022-04-25 DIAGNOSIS — Z170 Estrogen receptor positive status [ER+]: Secondary | ICD-10-CM | POA: Diagnosis not present

## 2022-04-25 DIAGNOSIS — I82542 Chronic embolism and thrombosis of left tibial vein: Secondary | ICD-10-CM | POA: Diagnosis not present

## 2022-04-28 DIAGNOSIS — Z170 Estrogen receptor positive status [ER+]: Secondary | ICD-10-CM | POA: Diagnosis not present

## 2022-04-28 DIAGNOSIS — Z9181 History of falling: Secondary | ICD-10-CM | POA: Diagnosis not present

## 2022-04-28 DIAGNOSIS — E039 Hypothyroidism, unspecified: Secondary | ICD-10-CM | POA: Diagnosis not present

## 2022-04-28 DIAGNOSIS — C7951 Secondary malignant neoplasm of bone: Secondary | ICD-10-CM | POA: Diagnosis not present

## 2022-04-28 DIAGNOSIS — I82542 Chronic embolism and thrombosis of left tibial vein: Secondary | ICD-10-CM | POA: Diagnosis not present

## 2022-05-01 DIAGNOSIS — Z760 Encounter for issue of repeat prescription: Secondary | ICD-10-CM | POA: Diagnosis not present

## 2022-05-01 DIAGNOSIS — M9702XD Periprosthetic fracture around internal prosthetic left hip joint, subsequent encounter: Secondary | ICD-10-CM | POA: Diagnosis not present

## 2022-05-02 DIAGNOSIS — I82542 Chronic embolism and thrombosis of left tibial vein: Secondary | ICD-10-CM | POA: Diagnosis not present

## 2022-05-02 DIAGNOSIS — C7951 Secondary malignant neoplasm of bone: Secondary | ICD-10-CM | POA: Diagnosis not present

## 2022-05-02 DIAGNOSIS — Z170 Estrogen receptor positive status [ER+]: Secondary | ICD-10-CM | POA: Diagnosis not present

## 2022-05-02 DIAGNOSIS — Z9181 History of falling: Secondary | ICD-10-CM | POA: Diagnosis not present

## 2022-05-02 DIAGNOSIS — E039 Hypothyroidism, unspecified: Secondary | ICD-10-CM | POA: Diagnosis not present

## 2022-05-03 ENCOUNTER — Other Ambulatory Visit: Payer: Self-pay

## 2022-05-03 ENCOUNTER — Inpatient Hospital Stay: Payer: Medicare Other | Attending: Oncology | Admitting: Hematology and Oncology

## 2022-05-03 ENCOUNTER — Inpatient Hospital Stay: Payer: Medicare Other

## 2022-05-03 ENCOUNTER — Encounter: Payer: Self-pay | Admitting: Nurse Practitioner

## 2022-05-03 ENCOUNTER — Inpatient Hospital Stay (HOSPITAL_BASED_OUTPATIENT_CLINIC_OR_DEPARTMENT_OTHER): Payer: Medicare Other | Admitting: Nurse Practitioner

## 2022-05-03 VITALS — BP 130/63 | HR 85 | Temp 97.9°F | Resp 16 | Ht 64.0 in

## 2022-05-03 DIAGNOSIS — R110 Nausea: Secondary | ICD-10-CM | POA: Diagnosis not present

## 2022-05-03 DIAGNOSIS — Z7901 Long term (current) use of anticoagulants: Secondary | ICD-10-CM | POA: Insufficient documentation

## 2022-05-03 DIAGNOSIS — M899 Disorder of bone, unspecified: Secondary | ICD-10-CM

## 2022-05-03 DIAGNOSIS — Z170 Estrogen receptor positive status [ER+]: Secondary | ICD-10-CM

## 2022-05-03 DIAGNOSIS — G893 Neoplasm related pain (acute) (chronic): Secondary | ICD-10-CM | POA: Diagnosis not present

## 2022-05-03 DIAGNOSIS — M8580 Other specified disorders of bone density and structure, unspecified site: Secondary | ICD-10-CM | POA: Diagnosis not present

## 2022-05-03 DIAGNOSIS — C50411 Malignant neoplasm of upper-outer quadrant of right female breast: Secondary | ICD-10-CM | POA: Diagnosis not present

## 2022-05-03 DIAGNOSIS — Z86718 Personal history of other venous thrombosis and embolism: Secondary | ICD-10-CM | POA: Insufficient documentation

## 2022-05-03 DIAGNOSIS — Z79811 Long term (current) use of aromatase inhibitors: Secondary | ICD-10-CM | POA: Diagnosis not present

## 2022-05-03 DIAGNOSIS — Z5111 Encounter for antineoplastic chemotherapy: Secondary | ICD-10-CM | POA: Diagnosis not present

## 2022-05-03 DIAGNOSIS — Z923 Personal history of irradiation: Secondary | ICD-10-CM | POA: Diagnosis not present

## 2022-05-03 DIAGNOSIS — Z96642 Presence of left artificial hip joint: Secondary | ICD-10-CM | POA: Insufficient documentation

## 2022-05-03 DIAGNOSIS — Z515 Encounter for palliative care: Secondary | ICD-10-CM | POA: Diagnosis not present

## 2022-05-03 DIAGNOSIS — R531 Weakness: Secondary | ICD-10-CM | POA: Diagnosis not present

## 2022-05-03 DIAGNOSIS — M84459A Pathological fracture, hip, unspecified, initial encounter for fracture: Secondary | ICD-10-CM | POA: Diagnosis not present

## 2022-05-03 DIAGNOSIS — M9702XD Periprosthetic fracture around internal prosthetic left hip joint, subsequent encounter: Secondary | ICD-10-CM | POA: Diagnosis not present

## 2022-05-03 DIAGNOSIS — I10 Essential (primary) hypertension: Secondary | ICD-10-CM | POA: Diagnosis not present

## 2022-05-03 DIAGNOSIS — C7951 Secondary malignant neoplasm of bone: Secondary | ICD-10-CM

## 2022-05-03 DIAGNOSIS — M84659P Pathological fracture in other disease, hip, unspecified, subsequent encounter for fracture with malunion: Secondary | ICD-10-CM

## 2022-05-03 DIAGNOSIS — E038 Other specified hypothyroidism: Secondary | ICD-10-CM

## 2022-05-03 LAB — CBC WITH DIFFERENTIAL/PLATELET
Abs Immature Granulocytes: 0.03 10*3/uL (ref 0.00–0.07)
Basophils Absolute: 0.1 10*3/uL (ref 0.0–0.1)
Basophils Relative: 1 %
Eosinophils Absolute: 0.2 10*3/uL (ref 0.0–0.5)
Eosinophils Relative: 3 %
HCT: 38.1 % (ref 36.0–46.0)
Hemoglobin: 12.6 g/dL (ref 12.0–15.0)
Immature Granulocytes: 0 %
Lymphocytes Relative: 19 %
Lymphs Abs: 1.5 10*3/uL (ref 0.7–4.0)
MCH: 29.4 pg (ref 26.0–34.0)
MCHC: 33.1 g/dL (ref 30.0–36.0)
MCV: 88.8 fL (ref 80.0–100.0)
Monocytes Absolute: 1.1 10*3/uL — ABNORMAL HIGH (ref 0.1–1.0)
Monocytes Relative: 13 %
Neutro Abs: 5.1 10*3/uL (ref 1.7–7.7)
Neutrophils Relative %: 64 %
Platelets: 271 10*3/uL (ref 150–400)
RBC: 4.29 MIL/uL (ref 3.87–5.11)
RDW: 15.9 % — ABNORMAL HIGH (ref 11.5–15.5)
WBC: 8.1 10*3/uL (ref 4.0–10.5)
nRBC: 0 % (ref 0.0–0.2)

## 2022-05-03 LAB — COMPREHENSIVE METABOLIC PANEL
ALT: 9 U/L (ref 0–44)
AST: 12 U/L — ABNORMAL LOW (ref 15–41)
Albumin: 3.9 g/dL (ref 3.5–5.0)
Alkaline Phosphatase: 151 U/L — ABNORMAL HIGH (ref 38–126)
Anion gap: 7 (ref 5–15)
BUN: 17 mg/dL (ref 8–23)
CO2: 28 mmol/L (ref 22–32)
Calcium: 10.9 mg/dL — ABNORMAL HIGH (ref 8.9–10.3)
Chloride: 104 mmol/L (ref 98–111)
Creatinine, Ser: 0.85 mg/dL (ref 0.44–1.00)
GFR, Estimated: >60 mL/min (ref >60)
Glucose, Bld: 134 mg/dL — ABNORMAL HIGH (ref 70–99)
Potassium: 4.2 mmol/L (ref 3.5–5.1)
Sodium: 139 mmol/L (ref 135–145)
Total Bilirubin: 0.3 mg/dL (ref 0.3–1.2)
Total Protein: 6.7 g/dL (ref 6.5–8.1)

## 2022-05-03 LAB — PROTIME-INR
INR: 2.5 — ABNORMAL HIGH (ref 0.8–1.2)
Prothrombin Time: 26.8 seconds — ABNORMAL HIGH (ref 11.4–15.2)

## 2022-05-03 MED ORDER — FULVESTRANT 250 MG/5ML IM SOSY
500.0000 mg | PREFILLED_SYRINGE | Freq: Once | INTRAMUSCULAR | Status: AC
  Administered 2022-05-03: 500 mg via INTRAMUSCULAR
  Filled 2022-05-03: qty 10

## 2022-05-03 MED ORDER — OXYCODONE HCL 5 MG PO TABS: 5.0000 mg | ORAL_TABLET | Freq: Three times a day (TID) | ORAL | 0 refills | Status: DC

## 2022-05-03 NOTE — Progress Notes (Signed)
[facility]  Telephone:(336) [phone] Fax:(336) [phone]   Name: Alicia Vasquez Date: 05/03/2022 MRN: [id number]  DOB: 1940/08/05  Patient Care Team: Alicia Barrack, MD as PCP - General (Family Medicine) Alicia Kingdom, MD as Consulting Physician (Internal Medicine) Alicia Renshaw, MD as Referring Physician (Internal Medicine) Alicia Vasquez, DPM as Consulting Physician (Podiatry) Alicia Loser, MD as Consulting Physician (Urology) Alicia Pike, MD as Consulting Physician (Hematology and Oncology) Alicia Cancel, MD as Consulting Physician (Orthopedic Surgery)    INTERVAL HISTORY: Alicia Vasquez is a 81 y.o. female with oncology medical history including right breast cancer ER positive s/p adjuvant radiation (2015), found to have metastatic disease August 2018 s/p left total hip replacement, multiple compression fractures, currently on fulvestrant therapy.  Palliative ask to see for symptom management and goals of care.   SOCIAL HISTORY:     reports that she has never smoked. She has never used smokeless tobacco. She reports that she does not drink alcohol and does not use drugs.  ADVANCE DIRECTIVES:    CODE STATUS:   PAST MEDICAL HISTORY: Past Medical History:  Diagnosis Date   Allergy    Anxiety    Arthritis    Blood transfusion without reported diagnosis    Breast cancer ([place]) 07/12/2013   Invasive Mammary Carcinoma   DVT (deep vein thrombosis) in pregnancy    Hypertension    Hypothyroid    Metastatic cancer to bone ([place]) dx'd 12/2016   hip   Personal history of radiation therapy    Pneumonia    PONV (postoperative nausea and vomiting)    Radiation 11/21/13-01/07/14   Right Breast/Supraclavicular    ALLERGIES:  is allergic to anesthetics, amide; benadryl [diphenhydramine hcl]; carbocaine [mepivacaine hcl]; codeine; epinephrine; sulfa antibiotics; diphenhydramine; latex; penicillins; and  tramadol.  MEDICATIONS:  Current Outpatient Medications  Medication Sig Dispense Refill   oxyCODONE (OXY IR/ROXICODONE) 5 MG immediate release tablet Take 1 tablet (5 mg total) by mouth every 8 (eight) hours. Scheduled. May take additional dose ('5mg'$ ) by mouth every 6 hours as needed for breakthrough pain. 90 tablet 0   acetaminophen (TYLENOL) 325 MG tablet Take 2 tablets (650 mg total) by mouth 2 (two) times daily. 60 tablet 1   Cholecalciferol (VITAMIN D3) 5000 UNITS TABS Take 5,000 Units by mouth daily.      levothyroxine (SYNTHROID) 125 MCG tablet Take 125 mcg by mouth daily before breakfast.     lisinopril (PRINIVIL) 10 MG tablet Take 1 tablet (10 mg total) by mouth daily. 30 tablet 1   naloxone (NARCAN) 0.4 MG/ML injection Inject 1 mL (0.4 mg total) into the muscle as needed. 1 mL 0   ondansetron (ZOFRAN) 8 MG tablet Take 1 tablet (8 mg total) by mouth every 8 (eight) hours as needed for nausea or vomiting. (Patient not taking: Reported on 03/14/2022) 90 tablet 0   warfarin (COUMADIN) 2.5 MG tablet Take 7.'5mg'$  daily (Patient taking differently: Take 2.5 mg by mouth as directed. Take along with 5 mg tablet=7.5 mg) 30 tablet 3   warfarin (COUMADIN) 5 MG tablet Take 7.'5mg'$  daily (Patient taking differently: Take 5 mg by mouth as directed. Take along with 2.5 mg tablet=7.5 mg) 90 tablet 1   No current facility-administered medications for this visit.    VITAL SIGNS: There were no vitals taken for this visit. There were no vitals filed for this visit.  Estimated body mass index is 37.77 kg/m as calculated from the following:  Height as of an earlier encounter on 05/03/22: '5\' 4"'$  (1.626 m).   Weight as of 03/15/22: 220 lb 0.3 oz (99.8 kg).   PERFORMANCE STATUS (ECOG) : 2 - Symptomatic, <50% confined to bed   Physical Exam General: NAD, wheelchair  Cardiovascular: regular rate and rhythm Pulmonary: clear ant fields Abdomen: soft, nontender, + bowel sounds Extremities: no edema, no joint  deformities Skin: no rashes Neurological: AAO x3, mood appropriate   IMPRESSION: [name] presents to clinic today for ongoing symptom management. Since last seen she was hospitalized after a near fall incident which resulted in increase pain with ambulation. At that time CT scan showed subacute left hip fracture. Orthopedist recommended non operative management, pain control as needed, and weightbearing as tolerated.  Since discharge patient has been a resident at [facility].  Patient is tearful expressing ongoing pain. She is actively working with therapy at the facility. Reports concerns on pain control. She has packet that she brought in from facility. I reviewed in detail. She received 4 doses of oxycodone on yesterday and a dose prior to her visit today. Patient rates her pain 9/10 prior to medication. If she receives every 6-8 hours her pain will decrease to 4-5/10 however if not she states her pain is uncontrolled causing her to sometimes cry. Reports concerns that she has to ask for her medication and then it taking 1-2 hours to receive.   Education provided on regimen. Given patient's level of pain will plan to schedule oxycodone with tylenol every 8 hours with allowance of as needed breakthrough medications.   Reports daily bowel movements. No concerns for constipation. Per [facility] she is taking daily colace. Denies nausea and vomiting.   I discussed the importance of continued conversation with family and their medical providers regarding overall plan of care and treatment options, ensuring decisions are within the context of the patients values and GOCs.  PLAN: Oxycodone 5 mg every 8 hours.  Patient may receive additional 5 mg every 6 hours as needed for breakthrough pain. Zofran 8 mg every 8 hours as needed for nausea.  Patient states she generally has nausea at least 2-3 days post Faslodex injection Colace twice daily Ongoing symptom management and support as needed I will  plan to see patient back in 3-4 weeks in collaboration with other oncology appointments.  I will give her a call in 1 week to closely evaluate her pain needs.    Patient expressed understanding and was in agreement with this plan. She also understands that She can call the clinic at any time with any questions, concerns, or complaints.   Any controlled substances utilized were prescribed in the context of palliative care. PDMP has been reviewed.   Time Total: 45 min   Visit consisted of counseling and education dealing with the complex and emotionally intense issues of symptom management and palliative care in the setting of serious and potentially life-threatening illness.Greater than 50%  of this time was spent counseling and coordinating care related to the above assessment and plan.  Alda Lea, AGPCNP-BC  Palliative Medicine Team/[facility] [place]

## 2022-05-03 NOTE — Progress Notes (Signed)
[place] [facility] Follow up:    [name], MD [address] [name] [address]   DIAGNOSIS:  Cancer Staging  Malignant neoplasm of upper-outer quadrant of right breast in female, estrogen receptor positive ([place]) Staging form: Breast, AJCC 7th Edition - Clinical: Stage IIA (T1, N1, cM0) - Unsigned Specimen type: Core Needle Biopsy Histopathologic type: 9931 Laterality: Right Staging comments: Staged at breast conference 07/24/13.  - Pathologic: Stage IV (M1) - Unsigned Specimen type: Core Needle Biopsy Histopathologic type: 9931 Laterality: Right   SUMMARY OF ONCOLOGIC HISTORY: 81 y.o. Alicia Vasquez status post right breast upper outer quadrant lumpectomy and sentinel lymph node sampling 09/09/2013 for an mpT1c pN1a, stage IIA invasive ductal carcinoma, estrogen and progesterone receptor both 100% positive with strong staining intensity, MIB-1 of 17% and no HER-2 amplification   (1) additional surgery for margin clearance 09/16/2013 obtained negative margins   (2) Oncotype DX recurrence score of 4 predicts a risk of outside the breast recurrence within 10 years of 7% if the patient's only systemic therapy is tamoxifen for 5 years. It also predicts no benefit from chemotherapy   (3) adjuvant radiation completed 01/07/2014   (4) anastrozole started 02/27/2014 stopped within 2 weeks because of arm swelling.              (a) bone density April 2016 showed osteopenia, with a t-score of -1.6             (b) anastrozole resumed 12/17/2015             (c) Bone density 09/20/2016 showed a T score of -2.2   (5) history of left lower extremity DVT 11/23/2012, initially on rivaroxaban, which caused chest pain, switch to Coumadin July 2014             METASTATIC DISEASE: August 2018 (6) status post left total hip replacement 01/16/2017 for estrogen receptor positive adenocarcinoma.             (a) CA 27-29 was 46.4 as of 04/18/2017             (b) chest CT  scan 05/02/2017 shows no lung or liver lesion concern; it does show aortic atherosclerosis             (c) baseline bone scan 05/02/2017 was negative             (d) PET scan 09/12/2017 shows no active disease, including bone             (e) PET scan and CT chest on 05/29/2018 show no active disease             (f) chest CT and bone scan 03/18/2019 showed no evidence of active disease             (g) lumbar spine MRI 01/06/2021 shows multiple compression fractures but no obvious metastases; noncontrast cervical spine and head CT scans 01/04/2021 showed no evidence of neoplastic disease   (7) fulvestrant started 04/18/2017   (8) denosumab/Xgeva started 05/16/2017             (a) changed to every 12-weeks after 10/03/2017 dose              (b) held starting with 06/13/2018 dose due to dental concerns   (9) unprovoked deep vein thrombosis involving the left posterior tibial v documented 11/23/2012, resolved on repeat 06/27/2013 and not recurrent on multiple Dopplers since, most recent 03/06/2018             (  a) on chronic anticoagulation with warfarin given ongoing risks (stage IV breast cancer, relative immobility secondary to claudication  CURRENT THERAPY: Fulvestrant  INTERVAL HISTORY:  Alicia Vasquez 81 y.o. female returns for follow up of her metastatic breast cancer.   She is here with her son today. She is currently in rehab since the femur fracture. She once again is very worried about continuing faslodex, most of her discussion today is concentrated on the pain she has in this leg, how she is not getting pain medication on time. She is very hard of hearing. Rest of the pertinent 10 point ROS reviewed and negative  Patient Active Problem List   Diagnosis Date Noted   Closed comminuted intertrochanteric fracture of proximal end of left femur, initial encounter ([place]) 03/16/2022   GERD (gastroesophageal reflux disease) 06/17/2021   Bilateral leg edema 06/03/2021   Pressure injury  of left buttock, stage 1 06/03/2021   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   Weakness 04/27/2021   Estrogen receptor positive status (ER+) 05/31/2019   Major depressive disorder, single episode, unspecified 05/31/2019   Morbid (severe) obesity due to excess calories ([place]) 05/31/2019   Neoplasm related pain (acute) (chronic) 05/31/2019   Aortic atherosclerosis ([place]) 06/13/2017   Pain from bone metastases ([place]) 01/25/2017   Malignant neoplasm metastatic to bone ([place]) 01/25/2017   Endometrial hyperplasia 01/16/2017   Lytic bone lesion of left femur 01/15/2017   Hip fracture, pathological ([place]) 01/15/2017   Lymphedema 09/17/2015   Long term current use of anticoagulant therapy 08/23/2015   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 ([place]) 06/18/2015   Bilateral knee pain 04/03/2015   Arm edema 08/28/2014   Vitamin D deficiency 04/23/2014   Hyperparathyroidism ([place]) 04/23/2014   Depression 07/18/2013   Malignant neoplasm of upper-outer quadrant of right breast in female, estrogen receptor positive ([place]) 07/15/2013   Overactive bladder 01/30/2013   Primary hypercoagulable state ([place]) 12/19/2012   Essential hypertension 09/27/2011   Hearing loss 09/27/2011   Seasonal allergies 09/27/2011   Hypothyroidism 08/26/2011    is allergic to anesthetics, amide; benadryl [diphenhydramine hcl]; carbocaine [mepivacaine hcl]; codeine; epinephrine; sulfa antibiotics; diphenhydramine; latex; penicillins; and tramadol.  MEDICAL HISTORY: Past Medical History:  Diagnosis Date   Allergy    Anxiety    Arthritis    Blood transfusion without reported diagnosis    Breast cancer ([place]) 07/12/2013   Invasive Mammary Carcinoma   DVT (deep vein thrombosis) in pregnancy    Hypertension    Hypothyroid    Metastatic cancer to bone ([place]) dx'd 12/2016   hip   Personal history of radiation therapy    Pneumonia    PONV (postoperative nausea and vomiting)    Radiation 11/21/13-01/07/14    Right Breast/Supraclavicular    SURGICAL HISTORY: Past Surgical History:  Procedure Laterality Date   BREAST LUMPECTOMY Left 2015   BREAST LUMPECTOMY WITH RADIOACTIVE SEED LOCALIZATION Right 09/09/2013   Procedure: BREAST LUMPECTOMY WITH RADIOACTIVE SEED LOCALIZATION WITH AXILLARY NODE EXCISION;  Surgeon: [name], MD;  Location: [facility];  Service: General;  Laterality: Right;   CYSTOSCOPY W/ URETERAL STENT PLACEMENT Right 01/07/2021   Procedure: CYSTOSCOPY WITH RETROGRADE PYELOGRAM/URETERAL STENT PLACEMENT;  Surgeon: Bjorn Loser, MD;  Location: [facility];  Service: Urology;  Laterality: Right;   CYSTOSCOPY/URETEROSCOPY/HOLMIUM LASER/STENT PLACEMENT Right 02/08/2021   Procedure: CYSTOSCOPY, RIGHT URETEROSCOPY, RIGHT RETRGRADE PYELOGRAM, HOLMIUM LASER/STENT PLACEMENT;  Surgeon: Lucas Mallow, MD;  Location: [facility];  Service: Urology;  Laterality: Right;  DENTAL SURGERY  04/19/2012   13 TEETH REMOVED   DILATION AND CURETTAGE OF UTERUS     IR RADIOLOGIST EVAL & MGMT  01/21/2021   ORIF PERIPROSTHETIC FRACTURE Left 01/31/2017   Procedure: REVISION and OPEN REDUCTION INTERNAL FIXATION (ORIF) PERIPROSTHETIC FRACTURE LEFT HIP;  Surgeon: Paralee Cancel, MD;  Location: [facility];  Service: Orthopedics;  Laterality: Left;  120 mins   RE-EXCISION OF BREAST LUMPECTOMY Right 09/24/2013   Procedure: RE-EXCISION OF RIGHT BREAST LUMPECTOMY;  Surgeon: [name], MD;  Location: [facility];  Service: General;  Laterality: Right;   TOTAL HIP ARTHROPLASTY Left 01/16/2017   Procedure: TOTAL HIP ARTHROPLASTY POSTERIOR;  Surgeon: Paralee Cancel, MD;  Location: [facility];  Service: Orthopedics;  Laterality: Left;    SOCIAL HISTORY: Social History   Socioeconomic History   Marital status: Widowed    Spouse name: Not on file   Number of children: 1   Years of education: Not on file   Highest education level: Not on file  Occupational History   Not on file  Tobacco  Use   Smoking status: Never   Smokeless tobacco: Never  Vaping Use   Vaping Use: Never used  Substance and Sexual Activity   Alcohol use: No    Alcohol/week: 0.0 standard drinks of alcohol   Drug use: No   Sexual activity: Never  Other Topics Concern   Not on file  Social History Narrative   Exercise: yard work.   Social Determinants of Health   Financial Resource Strain: Medium Risk (09/16/2021)   Overall Financial Resource Strain (CARDIA)    Difficulty of Paying Living Expenses: Somewhat hard  Food Insecurity: No Food Insecurity (03/15/2022)   Hunger Vital Sign    Worried About Running Out of Food in the Last Year: Never true    Ran Out of Food in the Last Year: Never true  Transportation Needs: Unmet Transportation Needs (03/15/2022)   PRAPARE - Hydrologist (Medical): Yes    Lack of Transportation (Non-Medical): No  Physical Activity: Inactive (09/13/2021)   Exercise Vital Sign    Days of Exercise per Week: 0 days    Minutes of Exercise per Session: 0 min  Stress: No Stress Concern Present (09/13/2021)   [place]    Feeling of Stress : Only a little  Social Connections: Socially Isolated (09/13/2021)   Social Connection and Isolation Panel [NHANES]    Frequency of Communication with Friends and Family: More than three times a week    Frequency of Social Gatherings with Friends and Family: Three times a week    Attends Religious Services: Never    Active Member of Clubs or Organizations: No    Attends Archivist Meetings: Never    Marital Status: Widowed  Intimate Partner Violence: Not At Risk (03/15/2022)   Humiliation, Afraid, Rape, and Kick questionnaire    Fear of Current or Ex-Partner: No    Emotionally Abused: No    Physically Abused: No    Sexually Abused: No    FAMILY HISTORY: Family History  Problem Relation Age of Onset   Heart disease Brother     Colon cancer Brother    Prostate cancer Brother      PHYSICAL EXAMINATION  ECOG PERFORMANCE STATUS: 3 - Symptomatic, >50% confined to bed  Vitals:   05/03/22 1319  BP: 130/63  Pulse: 85  Resp: 16  Temp: 97.9 F (36.6 C)  SpO2: 97%    Physical Exam Constitutional:      General: She is not in acute distress.    Appearance: Normal appearance. She is ill-appearing (She is chronically ill, arrived in a wheelchair). She is not toxic-appearing.  HENT:     Head: Normocephalic and atraumatic.  Eyes:     General: No scleral icterus. Cardiovascular:     Rate and Rhythm: Normal rate and regular rhythm.  Musculoskeletal:        General: Swelling present.     Cervical back: Neck supple.  Lymphadenopathy:     Cervical: No cervical adenopathy.  Skin:    General: Skin is warm and dry.     Findings: No rash.  Neurological:     General: No focal deficit present.     Mental Status: She is alert.     LABORATORY DATA:  CBC    Component Value Date/Time   WBC 8.1 05/03/2022 1302   RBC 4.29 05/03/2022 1302   HGB 12.6 05/03/2022 1302   HGB 13.0 02/04/2022 1245   HGB 11.6 05/16/2017 1417   HCT 38.1 05/03/2022 1302   HCT 36.7 05/16/2017 1417   PLT 271 05/03/2022 1302   PLT 335 02/04/2022 1245   PLT 323 05/16/2017 1417   MCV 88.8 05/03/2022 1302   MCV 80.8 05/16/2017 1417   MCH 29.4 05/03/2022 1302   MCHC 33.1 05/03/2022 1302   RDW 15.9 (H) 05/03/2022 1302   RDW 16.8 (H) 05/16/2017 1417   LYMPHSABS 1.5 05/03/2022 1302   LYMPHSABS 1.1 05/16/2017 1417   MONOABS 1.1 (H) 05/03/2022 1302   MONOABS 0.8 05/16/2017 1417   EOSABS 0.2 05/03/2022 1302   EOSABS 0.2 05/16/2017 1417   BASOSABS 0.1 05/03/2022 1302   BASOSABS 0.1 05/16/2017 1417    CMP     Component Value Date/Time   NA 138 03/17/2022 0336   NA 141 05/16/2017 1416   K 3.9 03/17/2022 0336   K 3.8 05/16/2017 1416   CL 107 03/17/2022 0336   CO2 27 03/17/2022 0336   CO2 27 05/16/2017 1416   GLUCOSE 106 (H)  03/17/2022 0336   GLUCOSE 140 05/16/2017 1416   BUN 23 03/17/2022 0336   BUN 13.2 05/16/2017 1416   CREATININE 1.09 (H) 03/17/2022 0336   CREATININE 0.95 06/14/2021 1049   CREATININE 0.80 09/22/2017 1555   CREATININE 0.8 05/16/2017 1416   CALCIUM 10.3 03/17/2022 0336   CALCIUM 11.0 (H) 05/16/2017 1416   PROT 6.1 (L) 03/17/2022 0336   PROT 6.9 05/16/2017 1416   ALBUMIN 3.2 (L) 03/17/2022 0336   ALBUMIN 3.6 05/16/2017 1416   AST 13 (L) 03/17/2022 0336   AST 10 (L) 06/14/2021 1049   AST 13 05/16/2017 1416   ALT 10 03/17/2022 0336   ALT 10 06/14/2021 1049   ALT <6 05/16/2017 1416   ALKPHOS 90 03/17/2022 0336   ALKPHOS 130 05/16/2017 1416   BILITOT 0.5 03/17/2022 0336   BILITOT 0.4 06/14/2021 1049   BILITOT 0.31 05/16/2017 1416   GFRNONAA 51 (L) 03/17/2022 0336   GFRNONAA >60 06/14/2021 1049   GFRNONAA 75 08/19/2015 1602   GFRAA >60 08/07/2019 1305   GFRAA >60 03/21/2018 1417   GFRAA 87 08/19/2015 1602     ASSESSMENT and THERAPY PLAN:   Alicia Vasquez is a 81 y.o. 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  #Metastatic breast cancer involving the bone: --Currently  on Fulvestrant injection q 4 weeks.  --Last CT scan and bone scan from 07/28/2021 showed stable compression deformities at T12, L4, and L5. No definite evidence osseous metastasis. Plan to repeat CT imaging every 6 months, next one due in September 2023.  -- She was strongly encouraged to continue Faslodex until progression or other concerns for toxicity.   -- We have once again discussed that her breast cancer remains well controlled and she had such difficulty with imaging, she only wants to do it once a yr. -- She doesn't have any clinical concerns for progression but once again, it is very hard to elicit history from her, she is focused on pain control almost all the time which is not related to her cancer,  she is extremely hard of hearing.  #H/O Left LE DVT currently on Coumadin:  Upon review, she appears to have had unprovoked DVT back in 2014 by Dr. Jana Hakim elected to continue and definite anticoagulation because of her sedentary status and metastatic breast cancer. I am not entirely sure if she needs to continue indefinite anticoagulation given her comorbidities and lack of active malignancy but she definitely is going to be a high risk for DVT given her sedentary status.  I have once again brought up this today, she is very worried about discontinuing anticoagulation. We discussed about risks of bleeding.  All questions were answered. The patient knows to call the clinic with any problems, questions or concerns. We can certainly see the patient much sooner if necessary.  I have spent a total of 40 minutes minutes of face-to-face and non-face-to-face time, preparing to see the patient, performing a medically appropriate examination, counseling and educating the patient, ordering medications/tests, documenting clinical information in the electronic health record, and care coordination.

## 2022-05-03 NOTE — Patient Instructions (Signed)

## 2022-05-04 ENCOUNTER — Telehealth: Payer: Self-pay | Admitting: Hematology and Oncology

## 2022-05-04 ENCOUNTER — Encounter: Payer: Self-pay | Admitting: Oncology

## 2022-05-04 ENCOUNTER — Telehealth: Payer: Self-pay

## 2022-05-04 NOTE — Telephone Encounter (Signed)
-----   Message from Gardenia Phlegm, NP sent at 05/04/2022  8:34 AM EST ----- INR is at 2.5 which is good range.  Would continue current dose of coumadin ----- Message ----- From: Interface, Lab In [place] Sent: 05/03/2022   3:03 PM EST To: Gardenia Phlegm, NP

## 2022-05-04 NOTE — Telephone Encounter (Signed)
Patient is currently in [place]. I called the facility [phone] and spoke with her nurse, Monette, and discussed lab results and plan. She verbalized understanding and had stated that they are also monitoring pt's INR and coumadin intake.

## 2022-05-04 NOTE — Telephone Encounter (Signed)
Called patient to notify of upcoming appointment. Left voicemail with appointment information

## 2022-05-04 NOTE — Telephone Encounter (Addendum)
Attempted to call patient once again and was able to speak with her this time. Lab results and plan were discussed with patient (see notes below). She verbalized understanding.   Per RN Katharine Look, pt left a vm asking about her oxycodone rx. Pt was made aware that per Palliative Care provider [facility], the rx was printed out and provided along with the packet that was given to her yesterday to take to [facility]. Pt verbalized understanding.

## 2022-05-04 NOTE — Telephone Encounter (Signed)
Per NP's request, called patient to discuss lab results and plan. Unable to speak with pt, left vm to call back.

## 2022-05-05 DIAGNOSIS — E039 Hypothyroidism, unspecified: Secondary | ICD-10-CM | POA: Diagnosis not present

## 2022-05-05 DIAGNOSIS — Z170 Estrogen receptor positive status [ER+]: Secondary | ICD-10-CM | POA: Diagnosis not present

## 2022-05-05 DIAGNOSIS — C7951 Secondary malignant neoplasm of bone: Secondary | ICD-10-CM | POA: Diagnosis not present

## 2022-05-05 DIAGNOSIS — I82542 Chronic embolism and thrombosis of left tibial vein: Secondary | ICD-10-CM | POA: Diagnosis not present

## 2022-05-05 DIAGNOSIS — Z9181 History of falling: Secondary | ICD-10-CM | POA: Diagnosis not present

## 2022-05-06 ENCOUNTER — Encounter: Payer: Self-pay | Admitting: Nurse Practitioner

## 2022-05-06 ENCOUNTER — Inpatient Hospital Stay (HOSPITAL_BASED_OUTPATIENT_CLINIC_OR_DEPARTMENT_OTHER): Payer: Medicare Other | Admitting: Nurse Practitioner

## 2022-05-06 DIAGNOSIS — Z515 Encounter for palliative care: Secondary | ICD-10-CM | POA: Diagnosis not present

## 2022-05-06 DIAGNOSIS — G893 Neoplasm related pain (acute) (chronic): Secondary | ICD-10-CM | POA: Diagnosis not present

## 2022-05-06 DIAGNOSIS — K5903 Drug induced constipation: Secondary | ICD-10-CM

## 2022-05-06 NOTE — Progress Notes (Signed)
[place]  Telephone:(336) [phone] Fax:(336) [phone]   Name: Alicia Vasquez Date: 05/06/2022 MRN: [id number]  DOB: [birth year]  Patient Care Team: [name], MD as PCP - General (Family Medicine) Philemon Kingdom, MD as Consulting Physician (Internal Medicine) Caprice Renshaw, MD as Referring Physician (Internal Medicine) Wallene Huh, DPM as Consulting Physician (Podiatry) Bjorn Loser, MD as Consulting Physician (Urology) Benay Pike, MD as Consulting Physician (Hematology and Oncology) Paralee Cancel, MD as Consulting Physician (Orthopedic Surgery)   I connected with Alicia Vasquez on 05/06/22 at 10:30 AM EST by pone and verified that I am speaking with the correct person using two identifiers.   I discussed the limitations, risks, security and privacy concerns of performing an evaluation and management service by telemedicine and the availability of in-person appointments. I also discussed with the patient that there may be a patient responsible charge related to this service. The patient expressed understanding and agreed to proceed.   Other persons participating in the visit and their role in the encounter: [place], RN    Patient's location: [facility]  Provider's location: [place]    Chief Complaint: Symptom management follow-up  INTERVAL HISTORY: Alicia Vasquez is a 81 y.o. female with oncology medical history including right breast cancer ER positive s/p adjuvant radiation (2015), found to have metastatic disease August 2018 s/p left total hip replacement, multiple compression fractures, currently on fulvestrant therapy.  Palliative ask to see for symptom management and goals of care.   SOCIAL HISTORY:     reports that she has never smoked. She has never used smokeless tobacco. She reports that she does not drink alcohol and does not use drugs.  ADVANCE DIRECTIVES:    CODE STATUS:    PAST MEDICAL HISTORY: Past Medical History:  Diagnosis Date   Allergy    Anxiety    Arthritis    Blood transfusion without reported diagnosis    Breast cancer ([place]) 07/12/2013   Invasive Mammary Carcinoma   DVT (deep vein thrombosis) in pregnancy    Hypertension    Hypothyroid    Metastatic cancer to bone ([place]) dx'd 12/2016   hip   Personal history of radiation therapy    Pneumonia    PONV (postoperative nausea and vomiting)    Radiation 11/21/13-01/07/14   Right Breast/Supraclavicular    ALLERGIES:  is allergic to anesthetics, amide; benadryl [diphenhydramine hcl]; carbocaine [mepivacaine hcl]; codeine; epinephrine; sulfa antibiotics; diphenhydramine; latex; penicillins; and tramadol.  MEDICATIONS:  Current Outpatient Medications  Medication Sig Dispense Refill   acetaminophen (TYLENOL) 325 MG tablet Take 2 tablets (650 mg total) by mouth 2 (two) times daily. 60 tablet 1   Cholecalciferol (VITAMIN D3) 5000 UNITS TABS Take 5,000 Units by mouth daily.      levothyroxine (SYNTHROID) 125 MCG tablet Take 125 mcg by mouth daily before breakfast.     lisinopril (PRINIVIL) 10 MG tablet Take 1 tablet (10 mg total) by mouth daily. 30 tablet 1   naloxone (NARCAN) 0.4 MG/ML injection Inject 1 mL (0.4 mg total) into the muscle as needed. 1 mL 0   ondansetron (ZOFRAN) 8 MG tablet Take 1 tablet (8 mg total) by mouth every 8 (eight) hours as needed for nausea or vomiting. (Patient not taking: Reported on 03/14/2022) 90 tablet 0   oxyCODONE (OXY IR/ROXICODONE) 5 MG immediate release tablet Take 1 tablet (5 mg total) by mouth every 8 (eight) hours. Scheduled. May take additional dose ('5mg'$ ) by  mouth every 6 hours as needed for breakthrough pain. 90 tablet 0   warfarin (COUMADIN) 2.5 MG tablet Take 7.'5mg'$  daily (Patient taking differently: Take 2.5 mg by mouth as directed. Take along with 5 mg tablet=7.5 mg) 30 tablet 3   warfarin (COUMADIN) 5 MG tablet Take 7.'5mg'$  daily (Patient taking differently:  Take 5 mg by mouth as directed. Take along with 2.5 mg tablet=7.5 mg) 90 tablet 1   No current facility-administered medications for this visit.    VITAL SIGNS: There were no vitals taken for this visit. There were no vitals filed for this visit.  Estimated body mass index is 37.77 kg/m as calculated from the following:   Height as of 05/03/22: '5\' 4"'$  (1.626 m).   Weight as of 03/15/22: 220 lb 0.3 oz (99.8 kg).   PERFORMANCE STATUS (ECOG) : 2 - Symptomatic, <50% confined to bed   IMPRESSION: I connected with Alicia Vasquez by phone for symptom management follow-up.  No acute distress identified.  Alicia Vasquez confirms since her visit earlier this week she is receiving her pain medication as prescribed.  Continues to have some pain but with improvement given around-the-clock use of oxycodone and availability of as needed medication for breakthrough pain.  She is hopeful that the weekend staff will also maintain her schedule to continue to allow her some relief.  Denies any concerns with constipation.  Reports daily bowel movements.  He is receiving Zofran as needed for nausea.  She understands we will continue to closely monitor and support as needed.    I discussed the importance of continued conversation with family and their medical providers regarding overall plan of care and treatment options, ensuring decisions are within the context of the patients values and GOCs.  PLAN: Oxycodone 5 mg every 8 hours.  Patient may receive additional 5 mg every 6 hours as needed for breakthrough pain. Zofran 8 mg every 8 hours as needed for nausea.  Patient states she generally has nausea at least 2-3 days post Faslodex injection Colace twice daily Ongoing symptom management and support as needed I will plan to see patient back in 3-4 weeks in collaboration with other oncology appointments.  I will give her a call in 1 week to closely evaluate her pain needs.    Patient expressed understanding and  was in agreement with this plan. She also understands that She can call the clinic at any time with any questions, concerns, or complaints.   Any controlled substances utilized were prescribed in the context of palliative care. PDMP has been reviewed.    Time Total: 20 min   Visit consisted of counseling and education dealing with the complex and emotionally intense issues of symptom management and palliative care in the setting of serious and potentially life-threatening illness.Greater than 50%  of this time was spent counseling and coordinating care related to the above assessment and plan.  Alda Lea, AGPCNP-BC  Palliative Medicine Team/[place]

## 2022-05-09 DIAGNOSIS — E039 Hypothyroidism, unspecified: Secondary | ICD-10-CM | POA: Diagnosis not present

## 2022-05-09 DIAGNOSIS — Z170 Estrogen receptor positive status [ER+]: Secondary | ICD-10-CM | POA: Diagnosis not present

## 2022-05-09 DIAGNOSIS — Z9181 History of falling: Secondary | ICD-10-CM | POA: Diagnosis not present

## 2022-05-09 DIAGNOSIS — C7951 Secondary malignant neoplasm of bone: Secondary | ICD-10-CM | POA: Diagnosis not present

## 2022-05-09 DIAGNOSIS — I82542 Chronic embolism and thrombosis of left tibial vein: Secondary | ICD-10-CM | POA: Diagnosis not present

## 2022-05-10 ENCOUNTER — Ambulatory Visit (INDEPENDENT_AMBULATORY_CARE_PROVIDER_SITE_OTHER): Payer: Medicare Other | Admitting: Podiatry

## 2022-05-10 DIAGNOSIS — Z91199 Patient's noncompliance with other medical treatment and regimen due to unspecified reason: Secondary | ICD-10-CM

## 2022-05-10 NOTE — Progress Notes (Signed)
1. No-show for appointment   Patient has the flu.

## 2022-05-11 ENCOUNTER — Other Ambulatory Visit: Payer: Self-pay | Admitting: *Deleted

## 2022-05-11 NOTE — Patient Outreach (Signed)
THN Post- Acute Care Coordinator follow up. Mrs. Pieper resides in [facility].  Screening for potential [facility] care coordination services.   Facility site visit to [facility]. Spoke with Marita Kansas, SNF social worker. Mrs. Neumeyer is now COVID positive. Transition plans are unclear at this time. Discharge date not set yet.   Will continue to follow.   [name], MSN, RN,BSN [place] Acute Care Coordinator [phone] (Direct dial)

## 2022-05-12 DIAGNOSIS — Z170 Estrogen receptor positive status [ER+]: Secondary | ICD-10-CM | POA: Diagnosis not present

## 2022-05-12 DIAGNOSIS — C7951 Secondary malignant neoplasm of bone: Secondary | ICD-10-CM | POA: Diagnosis not present

## 2022-05-12 DIAGNOSIS — E039 Hypothyroidism, unspecified: Secondary | ICD-10-CM | POA: Diagnosis not present

## 2022-05-12 DIAGNOSIS — I82542 Chronic embolism and thrombosis of left tibial vein: Secondary | ICD-10-CM | POA: Diagnosis not present

## 2022-05-12 DIAGNOSIS — Z9181 History of falling: Secondary | ICD-10-CM | POA: Diagnosis not present

## 2022-05-16 DIAGNOSIS — C7951 Secondary malignant neoplasm of bone: Secondary | ICD-10-CM | POA: Diagnosis not present

## 2022-05-16 DIAGNOSIS — E039 Hypothyroidism, unspecified: Secondary | ICD-10-CM | POA: Diagnosis not present

## 2022-05-16 DIAGNOSIS — Z9181 History of falling: Secondary | ICD-10-CM | POA: Diagnosis not present

## 2022-05-16 DIAGNOSIS — I82542 Chronic embolism and thrombosis of left tibial vein: Secondary | ICD-10-CM | POA: Diagnosis not present

## 2022-05-16 DIAGNOSIS — Z170 Estrogen receptor positive status [ER+]: Secondary | ICD-10-CM | POA: Diagnosis not present

## 2022-05-19 ENCOUNTER — Other Ambulatory Visit: Payer: Self-pay | Admitting: *Deleted

## 2022-05-19 DIAGNOSIS — Z9181 History of falling: Secondary | ICD-10-CM | POA: Diagnosis not present

## 2022-05-19 DIAGNOSIS — Z170 Estrogen receptor positive status [ER+]: Secondary | ICD-10-CM | POA: Diagnosis not present

## 2022-05-19 DIAGNOSIS — C7951 Secondary malignant neoplasm of bone: Secondary | ICD-10-CM | POA: Diagnosis not present

## 2022-05-19 DIAGNOSIS — E039 Hypothyroidism, unspecified: Secondary | ICD-10-CM | POA: Diagnosis not present

## 2022-05-19 DIAGNOSIS — I82542 Chronic embolism and thrombosis of left tibial vein: Secondary | ICD-10-CM | POA: Diagnosis not present

## 2022-05-19 NOTE — Patient Outreach (Signed)
[place] Coordinator follow up. [name] resides in [facility].   Facility site visit to [facility]. Unable to visit with [name] as she remains on COVID isolation.   Will continue to follow.   [name], MSN, RN,BSN [place] Acute Care Coordinator [phone] (Direct dial)

## 2022-05-25 DIAGNOSIS — M9702XD Periprosthetic fracture around internal prosthetic left hip joint, subsequent encounter: Secondary | ICD-10-CM | POA: Diagnosis not present

## 2022-05-25 DIAGNOSIS — I10 Essential (primary) hypertension: Secondary | ICD-10-CM | POA: Diagnosis not present

## 2022-05-25 DIAGNOSIS — Z86718 Personal history of other venous thrombosis and embolism: Secondary | ICD-10-CM | POA: Diagnosis not present

## 2022-05-25 DIAGNOSIS — E039 Hypothyroidism, unspecified: Secondary | ICD-10-CM | POA: Diagnosis not present

## 2022-05-26 ENCOUNTER — Telehealth: Payer: Self-pay

## 2022-05-26 DIAGNOSIS — I82542 Chronic embolism and thrombosis of left tibial vein: Secondary | ICD-10-CM | POA: Diagnosis not present

## 2022-05-26 DIAGNOSIS — C7951 Secondary malignant neoplasm of bone: Secondary | ICD-10-CM | POA: Diagnosis not present

## 2022-05-26 DIAGNOSIS — Z9181 History of falling: Secondary | ICD-10-CM | POA: Diagnosis not present

## 2022-05-26 DIAGNOSIS — E039 Hypothyroidism, unspecified: Secondary | ICD-10-CM | POA: Diagnosis not present

## 2022-05-26 DIAGNOSIS — Z170 Estrogen receptor positive status [ER+]: Secondary | ICD-10-CM | POA: Diagnosis not present

## 2022-05-26 NOTE — Telephone Encounter (Addendum)
Patient called and left VM stating that she is residing at [facility] and is in [employer] park [employer] for [employer]. She tested positive "two weeks ago" and is hoping to be out of quarantine soon. Patient will call clinic on 05/31/21 in AM to cancel appointments if she is still on quarantine.

## 2022-05-31 ENCOUNTER — Inpatient Hospital Stay: Payer: Medicare Other

## 2022-05-31 ENCOUNTER — Inpatient Hospital Stay: Payer: Medicare Other | Admitting: Nurse Practitioner

## 2022-05-31 NOTE — Progress Notes (Deleted)
[place]  Telephone:(336) [phone] Fax:(336) [phone]   Name: Alicia Vasquez Date: 05/31/2022 MRN: [id number]  DOB: December 06, 1940  Patient Care Team: [name], MD as PCP - General (Family Medicine) Philemon Kingdom, MD as Consulting Physician (Internal Medicine) Caprice Renshaw, MD as Referring Physician (Internal Medicine) Regal, Tamala Fothergill, DPM as Consulting Physician (Podiatry) Bjorn Loser, MD as Consulting Physician (Urology) Benay Pike, MD as Consulting Physician (Hematology and Oncology) Paralee Cancel, MD as Consulting Physician (Orthopedic Surgery)    INTERVAL HISTORY: Alicia Vasquez is a 82 y.o. female with oncology medical history including right breast cancer ER positive s/p adjuvant radiation (2015), found to have metastatic disease August 2018 s/p left total hip replacement, multiple compression fractures, currently on fulvestrant therapy.  Palliative ask to see for symptom management and goals of care.   SOCIAL HISTORY:     reports that she has never smoked. She has never used smokeless tobacco. She reports that she does not drink alcohol and does not use drugs.  ADVANCE DIRECTIVES:    CODE STATUS:   PAST MEDICAL HISTORY: Past Medical History:  Diagnosis Date   Allergy    Anxiety    Arthritis    Blood transfusion without reported diagnosis    Breast cancer ([place]) 07/12/2013   Invasive Mammary Carcinoma   DVT (deep vein thrombosis) in pregnancy    Hypertension    Hypothyroid    Metastatic cancer to bone ([place]) dx'd 12/2016   hip   Personal history of radiation therapy    Pneumonia    PONV (postoperative nausea and vomiting)    Radiation 11/21/13-01/07/14   Right Breast/Supraclavicular    ALLERGIES:  is allergic to anesthetics, amide; benadryl [diphenhydramine hcl]; carbocaine [mepivacaine hcl]; codeine; epinephrine; sulfa antibiotics; diphenhydramine; latex; penicillins; and  tramadol.  MEDICATIONS:  Current Outpatient Medications  Medication Sig Dispense Refill   acetaminophen (TYLENOL) 325 MG tablet Take 2 tablets (650 mg total) by mouth 2 (two) times daily. 60 tablet 1   Cholecalciferol (VITAMIN D3) 5000 UNITS TABS Take 5,000 Units by mouth daily.      levothyroxine (SYNTHROID) 125 MCG tablet Take 125 mcg by mouth daily before breakfast.     lisinopril (PRINIVIL) 10 MG tablet Take 1 tablet (10 mg total) by mouth daily. 30 tablet 1   naloxone (NARCAN) 0.4 MG/ML injection Inject 1 mL (0.4 mg total) into the muscle as needed. 1 mL 0   ondansetron (ZOFRAN) 8 MG tablet Take 1 tablet (8 mg total) by mouth every 8 (eight) hours as needed for nausea or vomiting. (Patient not taking: Reported on 03/14/2022) 90 tablet 0   oxyCODONE (OXY IR/ROXICODONE) 5 MG immediate release tablet Take 1 tablet (5 mg total) by mouth every 8 (eight) hours. Scheduled. May take additional dose ('5mg'$ ) by mouth every 6 hours as needed for breakthrough pain. 90 tablet 0   warfarin (COUMADIN) 2.5 MG tablet Take 7.'5mg'$  daily (Patient taking differently: Take 2.5 mg by mouth as directed. Take along with 5 mg tablet=7.5 mg) 30 tablet 3   warfarin (COUMADIN) 5 MG tablet Take 7.'5mg'$  daily (Patient taking differently: Take 5 mg by mouth as directed. Take along with 2.5 mg tablet=7.5 mg) 90 tablet 1   No current facility-administered medications for this visit.    VITAL SIGNS: There were no vitals taken for this visit. There were no vitals filed for this visit.  Estimated body mass index is 37.77 kg/m as calculated from the following:  Height as of 05/03/22: '5\' 4"'$  (1.626 m).   Weight as of 03/15/22: 220 lb 0.3 oz (99.8 kg).   PERFORMANCE STATUS (ECOG) : 2 - Symptomatic, <50% confined to bed   IMPRESSION:   I discussed the importance of continued conversation with family and their medical providers regarding overall plan of care and treatment options, ensuring decisions are within the context of  the patients values and GOCs.  PLAN: Oxycodone 5 mg every 8 hours.  Patient may receive additional 5 mg every 6 hours as needed for breakthrough pain. Zofran 8 mg every 8 hours as needed for nausea.  Patient states she generally has nausea at least 2-3 days post Faslodex injection Colace twice daily Ongoing symptom management and support as needed I will plan to see patient back in 3-4 weeks in collaboration with other oncology appointments.  I will give her a call in 1 week to closely evaluate her pain needs.    Patient expressed understanding and was in agreement with this plan. She also understands that She can call the clinic at any time with any questions, concerns, or complaints.   Any controlled substances utilized were prescribed in the context of palliative care. PDMP has been reviewed.    Time Total: 20 min   Visit consisted of counseling and education dealing with the complex and emotionally intense issues of symptom management and palliative care in the setting of serious and potentially life-threatening illness.Greater than 50%  of this time was spent counseling and coordinating care related to the above assessment and plan.  Alda Lea, AGPCNP-BC  Palliative Medicine Team/[place]

## 2022-06-01 ENCOUNTER — Other Ambulatory Visit: Payer: Self-pay | Admitting: *Deleted

## 2022-06-01 NOTE — Patient Outreach (Signed)
THN Post- Acute Care Coordinator follow up. Alicia Vasquez currently resides in [facility] skilled nursing facility. Screening for potential [facility] care coordination services as benefit of insurance plan and Primary Care Provider.   Update received from [place]. Alicia Vasquez won most recent appeal for discharge. Therapy reports 24/7 is needed upon returning home. Goal is for independence.   Will continue to follow.   [name], MSN, RN,BSN [place] Acute Care Coordinator [phone] (Direct dial)

## 2022-06-02 DIAGNOSIS — Z9181 History of falling: Secondary | ICD-10-CM | POA: Diagnosis not present

## 2022-06-02 DIAGNOSIS — I82542 Chronic embolism and thrombosis of left tibial vein: Secondary | ICD-10-CM | POA: Diagnosis not present

## 2022-06-02 DIAGNOSIS — E039 Hypothyroidism, unspecified: Secondary | ICD-10-CM | POA: Diagnosis not present

## 2022-06-02 DIAGNOSIS — Z170 Estrogen receptor positive status [ER+]: Secondary | ICD-10-CM | POA: Diagnosis not present

## 2022-06-02 DIAGNOSIS — C7951 Secondary malignant neoplasm of bone: Secondary | ICD-10-CM | POA: Diagnosis not present

## 2022-06-03 ENCOUNTER — Other Ambulatory Visit: Payer: Self-pay | Admitting: *Deleted
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
## 2022-06-06 DIAGNOSIS — Z170 Estrogen receptor positive status [ER+]: Secondary | ICD-10-CM | POA: Diagnosis not present

## 2022-06-06 DIAGNOSIS — I82542 Chronic embolism and thrombosis of left tibial vein: Secondary | ICD-10-CM | POA: Diagnosis not present

## 2022-06-06 DIAGNOSIS — C7951 Secondary malignant neoplasm of bone: Secondary | ICD-10-CM | POA: Diagnosis not present

## 2022-06-06 DIAGNOSIS — E039 Hypothyroidism, unspecified: Secondary | ICD-10-CM | POA: Diagnosis not present

## 2022-06-06 DIAGNOSIS — Z9181 History of falling: Secondary | ICD-10-CM | POA: Diagnosis not present

## 2022-06-08 ENCOUNTER — Other Ambulatory Visit: Payer: Self-pay | Admitting: *Deleted
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
## 2022-06-09 DIAGNOSIS — Z170 Estrogen receptor positive status [ER+]: Secondary | ICD-10-CM | POA: Diagnosis not present

## 2022-06-09 DIAGNOSIS — C7951 Secondary malignant neoplasm of bone: Secondary | ICD-10-CM | POA: Diagnosis not present

## 2022-06-09 DIAGNOSIS — Z9181 History of falling: Secondary | ICD-10-CM | POA: Diagnosis not present

## 2022-06-09 DIAGNOSIS — E039 Hypothyroidism, unspecified: Secondary | ICD-10-CM | POA: Diagnosis not present

## 2022-06-09 DIAGNOSIS — I82542 Chronic embolism and thrombosis of left tibial vein: Secondary | ICD-10-CM | POA: Diagnosis not present

## 2022-06-13 DIAGNOSIS — C7951 Secondary malignant neoplasm of bone: Secondary | ICD-10-CM | POA: Diagnosis not present

## 2022-06-13 DIAGNOSIS — Z9181 History of falling: Secondary | ICD-10-CM | POA: Diagnosis not present

## 2022-06-13 DIAGNOSIS — Z170 Estrogen receptor positive status [ER+]: Secondary | ICD-10-CM | POA: Diagnosis not present

## 2022-06-13 DIAGNOSIS — E039 Hypothyroidism, unspecified: Secondary | ICD-10-CM | POA: Diagnosis not present

## 2022-06-13 DIAGNOSIS — I82542 Chronic embolism and thrombosis of left tibial vein: Secondary | ICD-10-CM | POA: Diagnosis not present

## 2022-06-16 DIAGNOSIS — Z170 Estrogen receptor positive status [ER+]: Secondary | ICD-10-CM | POA: Diagnosis not present

## 2022-06-16 DIAGNOSIS — C7951 Secondary malignant neoplasm of bone: Secondary | ICD-10-CM | POA: Diagnosis not present

## 2022-06-16 DIAGNOSIS — Z9181 History of falling: Secondary | ICD-10-CM | POA: Diagnosis not present

## 2022-06-16 DIAGNOSIS — E039 Hypothyroidism, unspecified: Secondary | ICD-10-CM | POA: Diagnosis not present

## 2022-06-16 DIAGNOSIS — I82542 Chronic embolism and thrombosis of left tibial vein: Secondary | ICD-10-CM | POA: Diagnosis not present

## 2022-06-17 ENCOUNTER — Other Ambulatory Visit: Payer: Self-pay | Admitting: *Deleted
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
## 2022-06-21 DIAGNOSIS — I82542 Chronic embolism and thrombosis of left tibial vein: Secondary | ICD-10-CM | POA: Diagnosis not present

## 2022-06-21 DIAGNOSIS — Z9181 History of falling: Secondary | ICD-10-CM | POA: Diagnosis not present

## 2022-06-21 DIAGNOSIS — Z170 Estrogen receptor positive status [ER+]: Secondary | ICD-10-CM | POA: Diagnosis not present

## 2022-06-21 DIAGNOSIS — E039 Hypothyroidism, unspecified: Secondary | ICD-10-CM | POA: Diagnosis not present

## 2022-06-21 DIAGNOSIS — C7951 Secondary malignant neoplasm of bone: Secondary | ICD-10-CM | POA: Diagnosis not present

## 2022-06-23 DIAGNOSIS — E039 Hypothyroidism, unspecified: Secondary | ICD-10-CM | POA: Diagnosis not present

## 2022-06-23 DIAGNOSIS — C7951 Secondary malignant neoplasm of bone: Secondary | ICD-10-CM | POA: Diagnosis not present

## 2022-06-23 DIAGNOSIS — Z9181 History of falling: Secondary | ICD-10-CM | POA: Diagnosis not present

## 2022-06-23 DIAGNOSIS — Z170 Estrogen receptor positive status [ER+]: Secondary | ICD-10-CM | POA: Diagnosis not present

## 2022-06-23 DIAGNOSIS — I82542 Chronic embolism and thrombosis of left tibial vein: Secondary | ICD-10-CM | POA: Diagnosis not present

## 2022-06-24 ENCOUNTER — Other Ambulatory Visit: Payer: Self-pay | Admitting: *Deleted

## 2022-06-24 DIAGNOSIS — I10 Essential (primary) hypertension: Secondary | ICD-10-CM

## 2022-06-24 DIAGNOSIS — E039 Hypothyroidism, unspecified: Secondary | ICD-10-CM | POA: Diagnosis not present

## 2022-06-24 DIAGNOSIS — M9702XD Periprosthetic fracture around internal prosthetic left hip joint, subsequent encounter: Secondary | ICD-10-CM | POA: Diagnosis not present

## 2022-06-24 DIAGNOSIS — Z86718 Personal history of other venous thrombosis and embolism: Secondary | ICD-10-CM | POA: Diagnosis not present

## 2022-06-24 NOTE — Patient Outreach (Signed)
[place] Coordinator follow up. Alicia Vasquez is slated for discharge today 06/24/22 from [facility] skilled nursing facility. Her friend Alicia Vasquez is supposed to pick her up today, per Alicia Vasquez, Education officer, museum.   Confirmed with Alicia Vasquez social worker. Alicia Vasquez will have [place] services. PCP appointment has been scheduled for February 2nd at 11:20 am.   Alicia Vasquez used 100 Medicare SNF days. She is returning to [place]ia Vas[place] with her son. Please see writer's previous notes for additional details.   Will make referral to [facility] care coordination team for [facility] care coordination,  Social Worker, and [place] for mobile meals. [name] reported her friend Alicia Vasquez or son transports her to MD appointments.   Alicia Rolling, MSN, RN,BSN [place] Acute Care Coordinator [phone] (Direct dial)

## 2022-06-24 NOTE — Addendum Note (Signed)
Addended by: Harless Litten on: 06/24/2022 10:07 AM   Modules accepted: Orders

## 2022-06-25 DIAGNOSIS — C7951 Secondary malignant neoplasm of bone: Secondary | ICD-10-CM | POA: Diagnosis not present

## 2022-06-25 DIAGNOSIS — Z7901 Long term (current) use of anticoagulants: Secondary | ICD-10-CM | POA: Diagnosis not present

## 2022-06-25 DIAGNOSIS — C6951 Malignant neoplasm of right lacrimal gland and duct: Secondary | ICD-10-CM | POA: Diagnosis not present

## 2022-06-25 DIAGNOSIS — M9702XD Periprosthetic fracture around internal prosthetic left hip joint, subsequent encounter: Secondary | ICD-10-CM | POA: Diagnosis not present

## 2022-06-25 DIAGNOSIS — Z853 Personal history of malignant neoplasm of breast: Secondary | ICD-10-CM | POA: Diagnosis not present

## 2022-06-25 DIAGNOSIS — F419 Anxiety disorder, unspecified: Secondary | ICD-10-CM | POA: Diagnosis not present

## 2022-06-25 DIAGNOSIS — W19XXXD Unspecified fall, subsequent encounter: Secondary | ICD-10-CM | POA: Diagnosis not present

## 2022-06-25 DIAGNOSIS — M84552D Pathological fracture in neoplastic disease, left femur, subsequent encounter for fracture with routine healing: Secondary | ICD-10-CM | POA: Diagnosis not present

## 2022-06-25 DIAGNOSIS — I890 Lymphedema, not elsewhere classified: Secondary | ICD-10-CM | POA: Diagnosis not present

## 2022-06-25 DIAGNOSIS — C50919 Malignant neoplasm of unspecified site of unspecified female breast: Secondary | ICD-10-CM | POA: Diagnosis not present

## 2022-06-25 DIAGNOSIS — Z9181 History of falling: Secondary | ICD-10-CM | POA: Diagnosis not present

## 2022-06-25 DIAGNOSIS — Z86718 Personal history of other venous thrombosis and embolism: Secondary | ICD-10-CM | POA: Diagnosis not present

## 2022-06-25 DIAGNOSIS — M1990 Unspecified osteoarthritis, unspecified site: Secondary | ICD-10-CM | POA: Diagnosis not present

## 2022-06-25 DIAGNOSIS — E039 Hypothyroidism, unspecified: Secondary | ICD-10-CM | POA: Diagnosis not present

## 2022-06-25 DIAGNOSIS — Z79891 Long term (current) use of opiate analgesic: Secondary | ICD-10-CM | POA: Diagnosis not present

## 2022-06-25 DIAGNOSIS — N200 Calculus of kidney: Secondary | ICD-10-CM | POA: Diagnosis not present

## 2022-06-25 DIAGNOSIS — I10 Essential (primary) hypertension: Secondary | ICD-10-CM | POA: Diagnosis not present

## 2022-06-25 DIAGNOSIS — G8929 Other chronic pain: Secondary | ICD-10-CM | POA: Diagnosis not present

## 2022-06-27 ENCOUNTER — Telehealth: Payer: Self-pay | Admitting: *Deleted

## 2022-06-27 DIAGNOSIS — M84552D Pathological fracture in neoplastic disease, left femur, subsequent encounter for fracture with routine healing: Secondary | ICD-10-CM | POA: Diagnosis not present

## 2022-06-27 DIAGNOSIS — M9702XD Periprosthetic fracture around internal prosthetic left hip joint, subsequent encounter: Secondary | ICD-10-CM | POA: Diagnosis not present

## 2022-06-27 DIAGNOSIS — I10 Essential (primary) hypertension: Secondary | ICD-10-CM | POA: Diagnosis not present

## 2022-06-27 DIAGNOSIS — C7951 Secondary malignant neoplasm of bone: Secondary | ICD-10-CM | POA: Diagnosis not present

## 2022-06-27 DIAGNOSIS — C50919 Malignant neoplasm of unspecified site of unspecified female breast: Secondary | ICD-10-CM | POA: Diagnosis not present

## 2022-06-27 DIAGNOSIS — G8929 Other chronic pain: Secondary | ICD-10-CM | POA: Diagnosis not present

## 2022-06-27 NOTE — Progress Notes (Unsigned)
  Care Coordination  Outreach Note  06/27/2022 Name: Alicia Vasquez [facility] MRN: [id number] DOB: Nov 10, 1940   Care Coordination Outreach Attempts: An unsuccessful telephone outreach was attempted today to offer the patient information about available care coordination services as a benefit of their health plan.   Follow Up Plan:  Additional outreach attempts will be made to offer the patient care coordination information and services.   Encounter Outcome:  No Answer  [place]  Direct Dial: [phone]

## 2022-06-27 NOTE — Telephone Encounter (Signed)
   Telephone encounter was:  Unsuccessful.  06/27/2022 Name: Alicia Vasquez MRN: [id number] DOB: [birth year]  Unsuccessful outbound call made today to assist with:  Food Insecurity  Outreach Attempt:  1st Attempt Unable to leave vm   [place] [address] , [place] [address] Email : Ashby Dawes. Greenauer-moran [email or web address]

## 2022-06-28 ENCOUNTER — Inpatient Hospital Stay: Payer: Medicare Other | Attending: Oncology

## 2022-06-28 ENCOUNTER — Inpatient Hospital Stay: Payer: Medicare Other

## 2022-06-28 ENCOUNTER — Inpatient Hospital Stay (HOSPITAL_BASED_OUTPATIENT_CLINIC_OR_DEPARTMENT_OTHER): Payer: Medicare Other | Admitting: Adult Health

## 2022-06-28 ENCOUNTER — Telehealth: Payer: Self-pay

## 2022-06-28 ENCOUNTER — Other Ambulatory Visit: Payer: Self-pay

## 2022-06-28 VITALS — BP 133/73 | HR 84 | Temp 98.5°F | Resp 20

## 2022-06-28 DIAGNOSIS — Z5111 Encounter for antineoplastic chemotherapy: Secondary | ICD-10-CM | POA: Insufficient documentation

## 2022-06-28 DIAGNOSIS — I10 Essential (primary) hypertension: Secondary | ICD-10-CM | POA: Diagnosis not present

## 2022-06-28 DIAGNOSIS — Z170 Estrogen receptor positive status [ER+]: Secondary | ICD-10-CM

## 2022-06-28 DIAGNOSIS — Z8042 Family history of malignant neoplasm of prostate: Secondary | ICD-10-CM | POA: Diagnosis not present

## 2022-06-28 DIAGNOSIS — C7951 Secondary malignant neoplasm of bone: Secondary | ICD-10-CM

## 2022-06-28 DIAGNOSIS — C50411 Malignant neoplasm of upper-outer quadrant of right female breast: Secondary | ICD-10-CM | POA: Insufficient documentation

## 2022-06-28 DIAGNOSIS — M9702XD Periprosthetic fracture around internal prosthetic left hip joint, subsequent encounter: Secondary | ICD-10-CM | POA: Diagnosis not present

## 2022-06-28 DIAGNOSIS — Z86718 Personal history of other venous thrombosis and embolism: Secondary | ICD-10-CM | POA: Insufficient documentation

## 2022-06-28 DIAGNOSIS — M84659P Pathological fracture in other disease, hip, unspecified, subsequent encounter for fracture with malunion: Secondary | ICD-10-CM

## 2022-06-28 DIAGNOSIS — Z7901 Long term (current) use of anticoagulants: Secondary | ICD-10-CM | POA: Diagnosis not present

## 2022-06-28 DIAGNOSIS — G8929 Other chronic pain: Secondary | ICD-10-CM | POA: Diagnosis not present

## 2022-06-28 DIAGNOSIS — E038 Other specified hypothyroidism: Secondary | ICD-10-CM

## 2022-06-28 DIAGNOSIS — I82542 Chronic embolism and thrombosis of left tibial vein: Secondary | ICD-10-CM | POA: Diagnosis not present

## 2022-06-28 DIAGNOSIS — Z800 Family history of malignant neoplasm of digestive organs: Secondary | ICD-10-CM | POA: Diagnosis not present

## 2022-06-28 DIAGNOSIS — M84552D Pathological fracture in neoplastic disease, left femur, subsequent encounter for fracture with routine healing: Secondary | ICD-10-CM | POA: Diagnosis not present

## 2022-06-28 DIAGNOSIS — C50919 Malignant neoplasm of unspecified site of unspecified female breast: Secondary | ICD-10-CM | POA: Diagnosis not present

## 2022-06-28 DIAGNOSIS — Z96642 Presence of left artificial hip joint: Secondary | ICD-10-CM | POA: Insufficient documentation

## 2022-06-28 DIAGNOSIS — Z923 Personal history of irradiation: Secondary | ICD-10-CM | POA: Insufficient documentation

## 2022-06-28 DIAGNOSIS — M899 Disorder of bone, unspecified: Secondary | ICD-10-CM

## 2022-06-28 LAB — CBC WITH DIFFERENTIAL/PLATELET
Abs Immature Granulocytes: 0.06 10*3/uL (ref 0.00–0.07)
Basophils Absolute: 0.1 10*3/uL (ref 0.0–0.1)
Basophils Relative: 1 %
Eosinophils Absolute: 0.2 10*3/uL (ref 0.0–0.5)
Eosinophils Relative: 2 %
HCT: 36.8 % (ref 36.0–46.0)
Hemoglobin: 11.9 g/dL — ABNORMAL LOW (ref 12.0–15.0)
Immature Granulocytes: 1 %
Lymphocytes Relative: 12 %
Lymphs Abs: 1.2 10*3/uL (ref 0.7–4.0)
MCH: 28.7 pg (ref 26.0–34.0)
MCHC: 32.3 g/dL (ref 30.0–36.0)
MCV: 88.7 fL (ref 80.0–100.0)
Monocytes Absolute: 1.1 10*3/uL — ABNORMAL HIGH (ref 0.1–1.0)
Monocytes Relative: 11 %
Neutro Abs: 7.2 10*3/uL (ref 1.7–7.7)
Neutrophils Relative %: 73 %
Platelets: 309 10*3/uL (ref 150–400)
RBC: 4.15 MIL/uL (ref 3.87–5.11)
RDW: 15.2 % (ref 11.5–15.5)
WBC: 9.7 10*3/uL (ref 4.0–10.5)
nRBC: 0 % (ref 0.0–0.2)

## 2022-06-28 LAB — COMPREHENSIVE METABOLIC PANEL
ALT: 10 U/L (ref 0–44)
AST: 13 U/L — ABNORMAL LOW (ref 15–41)
Albumin: 3.7 g/dL (ref 3.5–5.0)
Alkaline Phosphatase: 155 U/L — ABNORMAL HIGH (ref 38–126)
Anion gap: 7 (ref 5–15)
BUN: 22 mg/dL (ref 8–23)
CO2: 26 mmol/L (ref 22–32)
Calcium: 11.4 mg/dL — ABNORMAL HIGH (ref 8.9–10.3)
Chloride: 104 mmol/L (ref 98–111)
Creatinine, Ser: 0.89 mg/dL (ref 0.44–1.00)
GFR, Estimated: >60 mL/min (ref >60)
Glucose, Bld: 113 mg/dL — ABNORMAL HIGH (ref 70–99)
Potassium: 4.2 mmol/L (ref 3.5–5.1)
Sodium: 137 mmol/L (ref 135–145)
Total Bilirubin: 0.4 mg/dL (ref 0.3–1.2)
Total Protein: 6.9 g/dL (ref 6.5–8.1)

## 2022-06-28 LAB — PROTIME-INR
INR: 1.4 — ABNORMAL HIGH (ref 0.8–1.2)
Prothrombin Time: 17.2 seconds — ABNORMAL HIGH (ref 11.4–15.2)

## 2022-06-28 MED ORDER — FULVESTRANT 250 MG/5ML IM SOSY
500.0000 mg | PREFILLED_SYRINGE | Freq: Once | INTRAMUSCULAR | Status: AC
  Administered 2022-06-28: 500 mg via INTRAMUSCULAR
  Filled 2022-06-28: qty 10

## 2022-06-28 NOTE — Telephone Encounter (Signed)
Received pt voicemail requesting to discuss medications. Attempted to call back, no answer VM left and callback number.

## 2022-06-28 NOTE — Progress Notes (Signed)
Per NP, Pt has not been taking any medications since d/c from [facility] stating she did not receive a med list. Called and spoke with RN at [facility] who assures that Pt did receive med list upon discharge and the RN could not provide any information regarding paperwork. LVM with medical records to send over discharge summary, med list, and notes relevant to care. Gave fax and phone numbers.

## 2022-06-28 NOTE — Progress Notes (Signed)
  Care Coordination  Outreach Note  06/28/2022 Name: Alicia Vasquez [facility] MRN: [id number] DOB: 03/05/1941   Care Coordination Outreach Attempts: A third unsuccessful outreach was attempted today to offer the patient with information about available care coordination services as a benefit of their health plan.   Follow Up Plan:  No further outreach attempts will be made at this time. We have been unable to contact the patient to offer or enroll patient in care coordination services  Encounter Outcome:  No Answer  [place]: [phone]

## 2022-06-28 NOTE — Patient Instructions (Signed)

## 2022-06-29 ENCOUNTER — Telehealth: Payer: Self-pay

## 2022-06-29 ENCOUNTER — Other Ambulatory Visit: Payer: Self-pay | Admitting: Nurse Practitioner

## 2022-06-29 ENCOUNTER — Encounter: Payer: Self-pay | Admitting: Oncology

## 2022-06-29 ENCOUNTER — Telehealth: Payer: Self-pay | Admitting: *Deleted

## 2022-06-29 ENCOUNTER — Encounter: Payer: Self-pay | Admitting: Adult Health

## 2022-06-29 DIAGNOSIS — Z515 Encounter for palliative care: Secondary | ICD-10-CM

## 2022-06-29 DIAGNOSIS — C7951 Secondary malignant neoplasm of bone: Secondary | ICD-10-CM

## 2022-06-29 DIAGNOSIS — M84459A Pathological fracture, hip, unspecified, initial encounter for fracture: Secondary | ICD-10-CM

## 2022-06-29 DIAGNOSIS — G893 Neoplasm related pain (acute) (chronic): Secondary | ICD-10-CM

## 2022-06-29 MED ORDER — LISINOPRIL 10 MG PO TABS: 10.0000 mg | ORAL_TABLET | Freq: Every day | ORAL | 1 refills | Status: DC

## 2022-06-29 MED ORDER — LEVOTHYROXINE SODIUM 125 MCG PO TABS: 125.0000 ug | ORAL_TABLET | Freq: Every day | ORAL | 0 refills | Status: DC

## 2022-06-29 MED ORDER — WARFARIN SODIUM 4 MG PO TABS: 4.0000 mg | ORAL_TABLET | ORAL | 0 refills | Status: DC

## 2022-06-29 MED ORDER — ONDANSETRON HCL 8 MG PO TABS: 8.0000 mg | ORAL_TABLET | Freq: Three times a day (TID) | ORAL | 0 refills | Status: DC | PRN

## 2022-06-29 NOTE — Telephone Encounter (Signed)
   Telephone encounter was:  Unsuccessful.  06/29/2022 Name: Crysta Gulick [place] MRN: [id number] DOB: 1940-09-06  Unsuccessful outbound call made today to assist with:  Food Insecurity  Outreach Attempt:  2nd Attempt   Unable to leave [place] [phone] [address] , [place] [address] Email : Ashby Dawes. Greenauer-moran [email or web address]

## 2022-06-29 NOTE — Telephone Encounter (Signed)
Called and spoke with friend, [name] and told her Wilber Bihari, NP sent a Rx for Coumadin to  [facility], read the Rx instruction. She verbalized understanding and ask that I call [name] her son.  Called son, [name] and left above message and ask him to call back for questions.  Attempted to call [facility]. No answer and no voicemail.

## 2022-06-29 NOTE — Assessment & Plan Note (Signed)
Blossom Crume is an 82 year old woman with 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  She has continued on fulvestrant given every 4 weeks.  She was previously receiving Xgeva however due to dental concerns that she developed she has not been able to remain on this medication.  Megham has completed her stay at [facility].  I apologized to her for the experience that she had.  She is unclear about her medications therefore we called [facility] a couple of times to request a discharge summary.  Refilled her ondansetron for the nausea that she can sometimes experience after receiving her injection.  She should be taking Coumadin 4 mg Tuesday Thursday Saturday and Sunday and 6 mg Monday Wednesday and Friday.  I have sent this into her pharmacy we will call [name] and make sure that she knows how to help [facility] with her medicine that way we can recheck her INR next week.  I also went ahead and refilled her lisinopril and levothyroxine since she said she did not have either of those medications.  I reached out to palliative care nurse practitioner Lexine Baton to inquire about her oxycodone.  It looks like she will be due for more pills on February 9 per PMP aware.  Erie follows up with her PCP at the end of this week.  I will have her return in 1 week for repeat INR and then we will see her back again on February 27 for labs, follow-up, and her injection.

## 2022-06-29 NOTE — Progress Notes (Signed)
[facility] Follow up:    [name], MD [address] [name] [address]   DIAGNOSIS:  Cancer Staging  Malignant neoplasm of upper-outer quadrant of right breast in female, estrogen receptor positive ([place]) Staging form: Breast, AJCC 7th Edition - Clinical: Stage IIA (T1, N1, cM0) - Unsigned Specimen type: Core Needle Biopsy Histopathologic type: 9931 Laterality: Right Staging comments: Staged at breast conference 07/24/13.  - Pathologic: Stage IV (M1) - Unsigned Specimen type: Core Needle Biopsy Histopathologic type: 9931 Laterality: Right   SUMMARY OF ONCOLOGIC HISTORY: 82 y.o. 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 It also predicts no benefit from chemotherapy   (3) adjuvant radiation completed 01/07/2014   (4) anastrozole started 02/27/2014 stopped within 2 weeks because of arm swelling.              (a) bone density April 2016 showed osteopenia, with a t-score of -1.6             (b) anastrozole resumed 12/17/2015             (c) Bone density 09/20/2016 showed a T score of -2.2   (5) history of left lower extremity DVT 11/23/2012, initially on rivaroxaban, which caused chest pain, switch to Coumadin July 2014             METASTATIC DISEASE: August 2018 (6) status post left total hip replacement 01/16/2017 for estrogen receptor positive adenocarcinoma.             (a) CA 27-29 was 46.4 as of 04/18/2017             (b) chest CT scan  05/02/2017 shows no lung or liver lesion concern; it does show aortic atherosclerosis             (c) baseline bone scan 05/02/2017 was negative             (d) PET scan 09/12/2017 shows no active disease, including bone             (e) PET scan and CT chest on 05/29/2018 show no active disease             (f) chest CT and bone scan 03/18/2019 showed no evidence of active disease             (g) lumbar spine MRI 01/06/2021 shows multiple compression fractures but no obvious metastases; noncontrast cervical spine and head CT scans 01/04/2021 showed no evidence of neoplastic disease   (7) fulvestrant started 04/18/2017   (8) denosumab/Xgeva started 05/16/2017             (a) changed to every 12-weeks after 10/03/2017 dose              (b) held starting with 06/13/2018 dose due to dental concerns   (9) unprovoked deep vein thrombosis involving the left posterior tibial v documented 11/23/2012, resolved on repeat 06/27/2013 and not recurrent on multiple Dopplers since, most recent 03/06/2018             (  a) on chronic anticoagulation with warfarin given ongoing risks (stage IV breast cancer, relative immobility secondary to claudication  CURRENT THERAPY: Fulvestrant  INTERVAL HISTORY: Alicia Vasquez 82 y.o. female returns for follow-up of her history of metastatic breast cancer.  This was an unscheduled visit she was discharged from [facility] yesterday and she tells me she was not given any of her medications and is not sure what she should be taking.  She is in tears crying about her experience there.  She is accompanied by her friend [name] who has been very helpful in the past with organizing her pills and helping make sure that she has what she needs and is taking it appropriately in particular her Coumadin.  Alicia Vasquez is fearful about continuing on the fulvestrant as she is afraid that this is what caused a hairline fracture in her femur.   Patient Active Problem List   Diagnosis Date  Noted   Closed comminuted intertrochanteric fracture of proximal end of left femur, initial encounter ([place]) 03/16/2022   GERD (gastroesophageal reflux disease) 06/17/2021   Bilateral leg edema 06/03/2021   Pressure injury of left buttock, stage 1 06/03/2021   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   Weakness 04/27/2021   Estrogen receptor positive status (ER+) 05/31/2019   Major depressive disorder, single episode, unspecified 05/31/2019   Morbid (severe) obesity due to excess calories ([place]) 05/31/2019   Neoplasm related pain (acute) (chronic) 05/31/2019   Aortic atherosclerosis ([place]) 06/13/2017   Pain from bone metastases ([place]) 01/25/2017   Malignant neoplasm metastatic to bone ([place]) 01/25/2017   Endometrial hyperplasia 01/16/2017   Lytic bone lesion of left femur 01/15/2017   Hip fracture, pathological ([place]) 01/15/2017   Lymphedema 09/17/2015   Long term current use of anticoagulant therapy 08/23/2015   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 ([place]) 06/18/2015   Bilateral knee pain 04/03/2015   Arm edema 08/28/2014   Vitamin D deficiency 04/23/2014   Hyperparathyroidism ([place]) 04/23/2014   Depression 07/18/2013   Malignant neoplasm of upper-outer quadrant of right breast in female, estrogen receptor positive ([place]) 07/15/2013   Overactive bladder 01/30/2013   Primary hypercoagulable state ([place]) 12/19/2012   Essential hypertension 09/27/2011   Hearing loss 09/27/2011   Seasonal allergies 09/27/2011   Hypothyroidism 08/26/2011    is allergic to anesthetics, amide; benadryl [diphenhydramine hcl]; carbocaine [mepivacaine hcl]; codeine; epinephrine; sulfa antibiotics; diphenhydramine; latex; penicillins; and tramadol.  MEDICAL HISTORY: Past Medical History:  Diagnosis Date   Allergy    Anxiety    Arthritis    Blood transfusion without reported diagnosis    Breast cancer ([place]) 07/12/2013   Invasive Mammary Carcinoma   DVT (deep vein thrombosis) in pregnancy     Hypertension    Hypothyroid    Metastatic cancer to bone ([place]) dx'd 12/2016   hip   Personal history of radiation therapy    Pneumonia    PONV (postoperative nausea and vomiting)    Radiation 11/21/13-01/07/14   Right Breast/Supraclavicular    SURGICAL HISTORY: Past Surgical History:  Procedure Laterality Date   BREAST LUMPECTOMY Left 2015   BREAST LUMPECTOMY WITH RADIOACTIVE SEED LOCALIZATION Right 09/09/2013   Procedure: BREAST LUMPECTOMY WITH RADIOACTIVE SEED LOCALIZATION WITH AXILLARY NODE EXCISION;  Surgeon: [name], MD;  Location: [facility];  Service: General;  Laterality: Right;   CYSTOSCOPY W/ URETERAL STENT PLACEMENT Right 01/07/2021   Procedure: CYSTOSCOPY WITH RETROGRADE PYELOGRAM/URETERAL STENT PLACEMENT;  Surgeon: Bjorn Loser, MD;  Location: [facility];  Service: Urology;  Laterality: Right;   CYSTOSCOPY/URETEROSCOPY/HOLMIUM LASER/STENT PLACEMENT Right 02/08/2021   Procedure: CYSTOSCOPY, RIGHT URETEROSCOPY, RIGHT RETRGRADE PYELOGRAM, HOLMIUM LASER/STENT PLACEMENT;  Surgeon: Lucas Mallow, MD;  Location: [facility];  Service: Urology;  Laterality: Right;   DENTAL SURGERY  04/19/2012   13 TEETH REMOVED   DILATION AND CURETTAGE OF UTERUS     IR RADIOLOGIST EVAL & MGMT  01/21/2021   ORIF PERIPROSTHETIC FRACTURE Left 01/31/2017   Procedure: REVISION and OPEN REDUCTION INTERNAL FIXATION (ORIF) PERIPROSTHETIC FRACTURE LEFT HIP;  Surgeon: Paralee Cancel, MD;  Location: [facility];  Service: Orthopedics;  Laterality: Left;  120 mins   RE-EXCISION OF BREAST LUMPECTOMY Right 09/24/2013   Procedure: RE-EXCISION OF RIGHT BREAST LUMPECTOMY;  Surgeon: [name], MD;  Location: [facility];  Service: General;  Laterality: Right;   TOTAL HIP ARTHROPLASTY Left 01/16/2017   Procedure: TOTAL HIP ARTHROPLASTY POSTERIOR;  Surgeon: Paralee Cancel, MD;  Location: [facility];  Service: Orthopedics;  Laterality: Left;    SOCIAL HISTORY: Social History    Socioeconomic History   Marital status: Widowed    Spouse name: Not on file   Number of children: 1   Years of education: Not on file   Highest education level: Not on file  Occupational History   Not on file  Tobacco Use   Smoking status: Never   Smokeless tobacco: Never  Vaping Use   Vaping Use: Never used  Substance and Sexual Activity   Alcohol use: No    Alcohol/week: 0.0 standard drinks of alcohol   Drug use: No   Sexual activity: Never  Other Topics Concern   Not on file  Social History Narrative   Exercise: yard work.   Social Determinants of Health   Financial Resource Strain: Medium Risk (09/16/2021)   Overall Financial Resource Strain (CARDIA)    Difficulty of Paying Living Expenses: Somewhat hard  Food Insecurity: No Food Insecurity (03/15/2022)   Hunger Vital Sign    Worried About Running Out of Food in the Last Year: Never true    Ran Out of Food in the Last Year: Never true  Transportation Needs: Unmet Transportation Needs (03/15/2022)   PRAPARE - Hydrologist (Medical): Yes    Lack of Transportation (Non-Medical): No  Physical Activity: Inactive (09/13/2021)   Exercise Vital Sign    Days of Exercise per Week: 0 days    Minutes of Exercise per Session: 0 min  Stress: No Stress Concern Present (09/13/2021)   [place]    Feeling of Stress : Only a little  Social Connections: Socially Isolated (09/13/2021)   Social Connection and Isolation Panel [NHANES]    Frequency of Communication with Friends and Family: More than three times a week    Frequency of Social Gatherings with Friends and Family: Three times a week    Attends Religious Services: Never    Active Member of Clubs or Organizations: No    Attends Archivist Meetings: Never    Marital Status: Widowed  Intimate Partner Violence: Not At Risk (03/15/2022)   Humiliation, Afraid, Rape, and Kick  questionnaire    Fear of Current or Ex-Partner: No    Emotionally Abused: No    Physically Abused: No    Sexually Abused: No    FAMILY HISTORY: Family History  Problem Relation Age of Onset   Heart disease Brother    Colon cancer Brother    Prostate cancer Brother  Review of Systems  Constitutional:  Positive for fatigue. Negative for appetite change, chills, fever and unexpected weight change.  HENT:   Negative for hearing loss, lump/mass and trouble swallowing.   Eyes:  Negative for eye problems and icterus.  Respiratory:  Negative for chest tightness, cough and shortness of breath.   Cardiovascular:  Negative for chest pain, leg swelling and palpitations.  Gastrointestinal:  Negative for abdominal distention, abdominal pain, constipation, diarrhea, nausea and vomiting.  Endocrine: Negative for hot flashes.  Genitourinary:  Negative for difficulty urinating.   Musculoskeletal:  Negative for arthralgias.       Chronic pain managed with oxycodone in the hip.  Skin:  Negative for itching and rash.  Neurological:  Negative for dizziness, extremity weakness, headaches and numbness.  Hematological:  Negative for adenopathy. Does not bruise/bleed easily.  Psychiatric/Behavioral:  Negative for depression. The patient is nervous/anxious.       PHYSICAL EXAMINATION   See [facility] for vitals  Physical Exam Constitutional:      Appearance: Normal appearance.  HENT:     Head: Normocephalic and atraumatic.  Eyes:     General: No scleral icterus. Cardiovascular:     Pulses: Normal pulses.     Heart sounds: Normal heart sounds.  Pulmonary:     Effort: Pulmonary effort is normal.     Breath sounds: Normal breath sounds.  Musculoskeletal:     Cervical back: Neck supple.  Skin:    General: Skin is warm and dry.  Neurological:     General: No focal deficit present.     Mental Status: She is alert.  Psychiatric:     Comments: Patient is tearful throughout visit      LABORATORY DATA:  CBC    Component Value Date/Time   WBC 9.7 06/28/2022 1223   RBC 4.15 06/28/2022 1223   HGB 11.9 (L) 06/28/2022 1223   HGB 13.0 02/04/2022 1245   HGB 11.6 05/16/2017 1417   HCT 36.8 06/28/2022 1223   HCT 36.7 05/16/2017 1417   PLT 309 06/28/2022 1223   PLT 335 02/04/2022 1245   PLT 323 05/16/2017 1417   MCV 88.7 06/28/2022 1223   MCV 80.8 05/16/2017 1417   MCH 28.7 06/28/2022 1223   MCHC 32.3 06/28/2022 1223   RDW 15.2 06/28/2022 1223   RDW 16.8 (H) 05/16/2017 1417   LYMPHSABS 1.2 06/28/2022 1223   LYMPHSABS 1.1 05/16/2017 1417   MONOABS 1.1 (H) 06/28/2022 1223   MONOABS 0.8 05/16/2017 1417   EOSABS 0.2 06/28/2022 1223   EOSABS 0.2 05/16/2017 1417   BASOSABS 0.1 06/28/2022 1223   BASOSABS 0.1 05/16/2017 1417    CMP     Component Value Date/Time   NA 137 06/28/2022 1223   NA 141 05/16/2017 1416   K 4.2 06/28/2022 1223   K 3.8 05/16/2017 1416   CL 104 06/28/2022 1223   CO2 26 06/28/2022 1223   CO2 27 05/16/2017 1416   GLUCOSE 113 (H) 06/28/2022 1223   GLUCOSE 140 05/16/2017 1416   BUN 22 06/28/2022 1223   BUN 13.2 05/16/2017 1416   CREATININE 0.89 06/28/2022 1223   CREATININE 0.95 06/14/2021 1049   CREATININE 0.80 09/22/2017 1555   CREATININE 0.8 05/16/2017 1416   CALCIUM 11.4 (H) 06/28/2022 1223   CALCIUM 11.0 (H) 05/16/2017 1416   PROT 6.9 06/28/2022 1223   PROT 6.9 05/16/2017 1416   ALBUMIN 3.7 06/28/2022 1223   ALBUMIN 3.6 05/16/2017 1416   AST 13 (L) 06/28/2022 1223   AST  10 (L) 06/14/2021 1049   AST 13 05/16/2017 1416   ALT 10 06/28/2022 1223   ALT 10 06/14/2021 1049   ALT <6 05/16/2017 1416   ALKPHOS 155 (H) 06/28/2022 1223   ALKPHOS 130 05/16/2017 1416   BILITOT 0.4 06/28/2022 1223   BILITOT 0.4 06/14/2021 1049   BILITOT 0.31 05/16/2017 1416   GFRNONAA >60 06/28/2022 1223   GFRNONAA >60 06/14/2021 1049   GFRNONAA 75 08/19/2015 1602   GFRAA >60 08/07/2019 1305   GFRAA >60 03/21/2018 1417   GFRAA 87 08/19/2015 1602    INR is 1.4 today   ASSESSMENT and THERAPY PLAN:   Malignant neoplasm metastatic to bone (HCC) Alicia Vasquez is an 82 year old woman with 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  She has continued on fulvestrant given every 4 weeks.  She was previously receiving Xgeva however due to dental concerns that she developed she has not been able to remain on this medication.  Eleisha has completed her stay at [facility].  I apologized to her for the experience that she had.  She is unclear about her medications therefore we called [facility] a couple of times to request a discharge summary.  Refilled her ondansetron for the nausea that she can sometimes experience after receiving her injection.  She should be taking Coumadin 4 mg Tuesday Thursday Saturday and Sunday and 6 mg Monday Wednesday and Friday.  I have sent this into her pharmacy we will call [name] and make sure that she knows how to help [facility] with her medicine that way we can recheck her INR next week.  I also went ahead and refilled her lisinopril and levothyroxine since she said she did not have either of those medications.  I reached out to palliative care nurse practitioner Lexine Baton to inquire about her oxycodone.  It looks like she will be due for more pills on February 9 per PMP aware.  Elliemae follows up with her PCP at the end of this week.  I will have her return in 1 week for repeat INR and then we will see her back again on February 27 for labs, follow-up, and her injection.  All questions were answered. The patient knows to call the clinic with any problems, questions or concerns. We can certainly see the patient much sooner if necessary.  Total encounter time:30 minutes*in face-to-face visit time, chart review, lab review, care coordination, order entry, and documentation of the encounter time.    Wilber Bihari, NP 06/29/22 11:27 AM Medical Oncology and Hematology [facility] [place],  [place] [address] Tel. [phone]    Fax. [phone]  *Total Encounter Time as defined by the Centers for Medicare and Medicaid Services includes, in addition to the face-to-face time of a patient visit (documented in the note above) non-face-to-face time: obtaining and reviewing outside history, ordering and reviewing medications, tests or procedures, care coordination (communications with other health care professionals or caregivers) and documentation in the medical record.

## 2022-06-30 ENCOUNTER — Other Ambulatory Visit: Payer: Self-pay | Admitting: Nurse Practitioner

## 2022-06-30 ENCOUNTER — Telehealth: Payer: Self-pay

## 2022-06-30 DIAGNOSIS — G8929 Other chronic pain: Secondary | ICD-10-CM | POA: Diagnosis not present

## 2022-06-30 DIAGNOSIS — M9702XD Periprosthetic fracture around internal prosthetic left hip joint, subsequent encounter: Secondary | ICD-10-CM | POA: Diagnosis not present

## 2022-06-30 DIAGNOSIS — I10 Essential (primary) hypertension: Secondary | ICD-10-CM | POA: Diagnosis not present

## 2022-06-30 DIAGNOSIS — C7951 Secondary malignant neoplasm of bone: Secondary | ICD-10-CM

## 2022-06-30 DIAGNOSIS — M84459A Pathological fracture, hip, unspecified, initial encounter for fracture: Secondary | ICD-10-CM

## 2022-06-30 DIAGNOSIS — Z515 Encounter for palliative care: Secondary | ICD-10-CM

## 2022-06-30 DIAGNOSIS — G893 Neoplasm related pain (acute) (chronic): Secondary | ICD-10-CM

## 2022-06-30 DIAGNOSIS — C50919 Malignant neoplasm of unspecified site of unspecified female breast: Secondary | ICD-10-CM | POA: Diagnosis not present

## 2022-06-30 DIAGNOSIS — M84552D Pathological fracture in neoplastic disease, left femur, subsequent encounter for fracture with routine healing: Secondary | ICD-10-CM | POA: Diagnosis not present

## 2022-06-30 MED ORDER — OXYCODONE HCL 5 MG PO TABS: 5.0000 mg | ORAL_TABLET | Freq: Four times a day (QID) | ORAL | 0 refills | Status: DC | PRN

## 2022-06-30 NOTE — Telephone Encounter (Signed)
Returned pt VM to notify of scripts called in by Thedore Mins, NP, no further questions at this time.

## 2022-06-30 NOTE — Telephone Encounter (Signed)
   Telephone encounter was:  Unsuccessful.  06/30/2022 Name: Kamoni Depree Delon MRN: [id number] DOB: March 04, 1941  Unsuccessful outbound call made today to assist with:  Transportation Needs  and Food Insecurity  Outreach Attempt:  2nd Attempt  No way to leave [place] [phone] [address] , [place] [address] Email : [email or web address]

## 2022-07-01 ENCOUNTER — Ambulatory Visit (INDEPENDENT_AMBULATORY_CARE_PROVIDER_SITE_OTHER): Payer: Medicare Other | Admitting: Family Medicine

## 2022-07-01 ENCOUNTER — Encounter: Payer: Self-pay | Admitting: Family Medicine

## 2022-07-01 ENCOUNTER — Telehealth: Payer: Self-pay | Admitting: *Deleted

## 2022-07-01 VITALS — BP 122/59 | HR 88 | Temp 97.5°F | Ht 64.0 in

## 2022-07-01 DIAGNOSIS — I10 Essential (primary) hypertension: Secondary | ICD-10-CM

## 2022-07-01 DIAGNOSIS — E039 Hypothyroidism, unspecified: Secondary | ICD-10-CM

## 2022-07-01 DIAGNOSIS — M84459A Pathological fracture, hip, unspecified, initial encounter for fracture: Secondary | ICD-10-CM

## 2022-07-01 DIAGNOSIS — C7951 Secondary malignant neoplasm of bone: Secondary | ICD-10-CM

## 2022-07-01 NOTE — Telephone Encounter (Signed)
   Telephone encounter was:  Unsuccessful.  07/01/2022 Name: Rasha Ibe Schroepfer MRN: [id number] DOB: 02/25/1941  Unsuccessful outbound call made today to assist with:  Food Insecurity  Outreach Attempt:  3rd Attempt.  Referral closed unable to contact patient.  A HIPAA compliant voice message was left requesting a return call.  Instructed patient to call back at [phone].  [place] [phone] [address] , [place] [address] Email : [email or web address]

## 2022-07-01 NOTE — Assessment & Plan Note (Signed)
Follows with endocrinology for this.  She just recently had her Synthroid refilled and has this at home.  Will defer further management endocrinology.

## 2022-07-01 NOTE — Assessment & Plan Note (Signed)
Pain is currently manageable with oxycodone '5mg'$  every 6 hours.  Does not need refill today.  She will follow-up with orthopedics as previously planned.

## 2022-07-01 NOTE — Assessment & Plan Note (Signed)
Blood pressure at goal today on lisinopril 10 mg daily.

## 2022-07-01 NOTE — Progress Notes (Signed)
Alicia Vasquez is a 82 y.o. female who presents today for an office visit.  Assessment/Plan:  Chronic Problems Addressed Today: Hip fracture, pathological (HCC) Pain is currently manageable with oxycodone '5mg'$  every 6 hours.  Does not need refill today.  She will follow-up with orthopedics as previously planned.  Hypothyroidism Follows with endocrinology for this.  She just recently had her Synthroid refilled and has this at home.  Will defer further management endocrinology.  Essential hypertension Blood pressure at goal today on lisinopril 10 mg daily.  Poor social determinants of health Patient has difficult living situation and quite a few financial concerns as well.  Referral was placed to care manager however they have not yet gotten contact.  We gave phone number for them to call today.   She will come back in 6 months for follow-up visit.  We discussed reasons to return to care.    Subjective:  HPI:  See A/P for status of chronic conditions.  Patient is here today for follow-up.  She was recently discharged from rehab facility. She was last seen here about 5 months ago. Since our last visit she fell at the cancer center into her wheelchair. She broke her hip and was admitted to the hospital. Orthopedics was consulted who recommended conservative management. She was in the hospital for 3 days and then sent to rehab. She was discharged home earlier this week. She has been doing reasonably well since being home but had some issue with getting her medications.  She was not sent home with any medications.  She was been following up with oncology and they were able to send in her home meds.  She was able to get on this.  Pain seems to be better controlled with her pain medications.  She has been taking oxycodone every 6 hours as needed.  Also uses Tylenol as needed.  Still has quite a bit of pain in her left leg but this seems to be stable.  ROS: Per HPI, otherwise a complete  review of systems was negative.   PMH:  The following were reviewed and entered/updated in epic: Past Medical History:  Diagnosis Date   Allergy    Anxiety    Arthritis    Blood transfusion without reported diagnosis    Breast cancer ([place]) 07/12/2013   Invasive Mammary Carcinoma   DVT (deep vein thrombosis) in pregnancy    Hypertension    Hypothyroid    Metastatic cancer to bone ([place]) dx'd 12/2016   hip   Personal history of radiation therapy    Pneumonia    PONV (postoperative nausea and vomiting)    Radiation 11/21/13-01/07/14   Right Breast/Supraclavicular   Patient Active Problem List   Diagnosis Date Noted   GERD (gastroesophageal reflux disease) 06/17/2021   Bilateral leg edema 06/03/2021   Pressure injury of left buttock, stage 1 06/03/2021   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   Weakness 04/27/2021   Morbid (severe) obesity due to excess calories ([place]) 05/31/2019   Aortic atherosclerosis ([place]) 06/13/2017   Malignant neoplasm metastatic to bone ([place]) 01/25/2017   Endometrial hyperplasia 01/16/2017   Lytic bone lesion of left femur 01/15/2017   Hip fracture, pathological ([place]) 01/15/2017   Lymphedema 09/17/2015   Long term current use of anticoagulant therapy 08/23/2015   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 ([place]) 06/18/2015   Bilateral knee pain 04/03/2015   Arm edema 08/28/2014   Vitamin D deficiency 04/23/2014   Hyperparathyroidism ([place]) 04/23/2014  Depression 07/18/2013   Malignant neoplasm of upper-outer quadrant of right breast in female, estrogen receptor positive ([place]) 07/15/2013   Overactive bladder 01/30/2013   Primary hypercoagulable state ([place]) 12/19/2012   Essential hypertension 09/27/2011   Hearing loss 09/27/2011   Seasonal allergies 09/27/2011   Hypothyroidism 08/26/2011   Past Surgical History:  Procedure Laterality Date   BREAST LUMPECTOMY Left 2015   BREAST LUMPECTOMY WITH RADIOACTIVE SEED LOCALIZATION Right  09/09/2013   Procedure: BREAST LUMPECTOMY WITH RADIOACTIVE SEED LOCALIZATION WITH AXILLARY NODE EXCISION;  Surgeon: [name], MD;  Location: [facility];  Service: General;  Laterality: Right;   CYSTOSCOPY W/ URETERAL STENT PLACEMENT Right 01/07/2021   Procedure: CYSTOSCOPY WITH RETROGRADE PYELOGRAM/URETERAL STENT PLACEMENT;  Surgeon: Bjorn Loser, MD;  Location: [facility];  Service: Urology;  Laterality: Right;   CYSTOSCOPY/URETEROSCOPY/HOLMIUM LASER/STENT PLACEMENT Right 02/08/2021   Procedure: CYSTOSCOPY, RIGHT URETEROSCOPY, RIGHT RETRGRADE PYELOGRAM, HOLMIUM LASER/STENT PLACEMENT;  Surgeon: Lucas Mallow, MD;  Location: [facility];  Service: Urology;  Laterality: Right;   DENTAL SURGERY  04/19/2012   13 TEETH REMOVED   DILATION AND CURETTAGE OF UTERUS     IR RADIOLOGIST EVAL & MGMT  01/21/2021   ORIF PERIPROSTHETIC FRACTURE Left 01/31/2017   Procedure: REVISION and OPEN REDUCTION INTERNAL FIXATION (ORIF) PERIPROSTHETIC FRACTURE LEFT HIP;  Surgeon: Paralee Cancel, MD;  Location: [facility];  Service: Orthopedics;  Laterality: Left;  120 mins   RE-EXCISION OF BREAST LUMPECTOMY Right 09/24/2013   Procedure: RE-EXCISION OF RIGHT BREAST LUMPECTOMY;  Surgeon: [name], MD;  Location: [facility];  Service: General;  Laterality: Right;   TOTAL HIP ARTHROPLASTY Left 01/16/2017   Procedure: TOTAL HIP ARTHROPLASTY POSTERIOR;  Surgeon: Paralee Cancel, MD;  Location[facility];  Service: Orthopedics;  Laterality: Left;    Family History  Problem Relation Age of Onset   Heart disease Brother    Colon cancer Brother    Prostate cancer Brother     Medications- reviewed and updated Current Outpatient Medications  Medication Sig Dispense Refill   cholecalciferol (VITAMIN D3) 25 MCG (1000 UNIT) tablet Take 1,000 Units by mouth daily.     levothyroxine (SYNTHROID) 125 MCG tablet Take 1 tablet (125 mcg total) by mouth daily before breakfast. 90 tablet 0   lisinopril  (PRINIVIL) 10 MG tablet Take 1 tablet (10 mg total) by mouth daily. 90 tablet 1   naloxone (NARCAN) 0.4 MG/ML injection Inject 1 mL (0.4 mg total) into the muscle as needed. 1 mL 0   ondansetron (ZOFRAN) 8 MG tablet Take 1 tablet (8 mg total) by mouth every 8 (eight) hours as needed for nausea or vomiting. 90 tablet 0   [START ON 07/08/2022] oxyCODONE (OXY IR/ROXICODONE) 5 MG immediate release tablet Take 1 tablet (5 mg total) by mouth every 6 (six) hours as needed for severe pain. 45 tablet 0   warfarin (COUMADIN) 4 MG tablet Take 1-1.5 tablets (4-6 mg total) by mouth as directed. Take 4 mg Tuesday, Thursday, Saturday and Sunday, and 6 mg Monday, Wednesday, and Friday 60 tablet 0   No current facility-administered medications for this visit.    Allergies-reviewed and updated Allergies  Allergen Reactions   Anesthetics, Amide Hypertension   Benadryl [Diphenhydramine Hcl] Other (See Comments)    Dizziness   Carbocaine [Mepivacaine Hcl] Hypertension   Codeine Other (See Comments)    Dizziness   Epinephrine Hypertension   Sulfa Antibiotics Other (See Comments)    dizziness   Diphenhydramine    Latex Other (  See Comments)    Blisters in mouth   Penicillins Rash    Has patient had a PCN reaction causing immediate rash, facial/tongue/throat swelling, SOB or lightheadedness with hypotension: Unknown Has patient had a PCN reaction causing severe rash involving mucus membranes or skin necrosis: Unknown Has patient had a PCN reaction that required hospitalization: Unknown Has patient had a PCN reaction occurring within the last 10 years: Unknown If all of the above answers are "NO", then may proceed with Cephalosporin use.    Tramadol Other (See Comments)    Sedation.     Social History   Socioeconomic History   Marital status: Widowed    Spouse name: Not on file   Number of children: 1   Years of education: Not on file   Highest education level: Not on file  Occupational History   Not  on file  Tobacco Use   Smoking status: Never   Smokeless tobacco: Never  Vaping Use   Vaping Use: Never used  Substance and Sexual Activity   Alcohol use: No    Alcohol/week: 0.0 standard drinks of alcohol   Drug use: No   Sexual activity: Never  Other Topics Concern   Not on file  Social History Narrative   Exercise: yard work.   Social Determinants of Health   Financial Resource Strain: Medium Risk (09/16/2021)   Overall Financial Resource Strain (CARDIA)    Difficulty of Paying Living Expenses: Somewhat hard  Food Insecurity: No Food Insecurity (03/15/2022)   Hunger Vital Sign    Worried About Running Out of Food in the Last Year: Never true    Ran Out of Food in the Last Year: Never true  Transportation Needs: Unmet Transportation Needs (03/15/2022)   PRAPARE - Hydrologist (Medical): Yes    Lack of Transportation (Non-Medical): No  Physical Activity: Inactive (09/13/2021)   Exercise Vital Sign    Days of Exercise per Week: 0 days    Minutes of Exercise per Session: 0 min  Stress: No Stress Concern Present (09/13/2021)   [place]    Feeling of Stress : Only a little  Social Connections: Socially Isolated (09/13/2021)   Social Connection and Isolation Panel [NHANES]    Frequency of Communication with Friends and Family: More than three times a week    Frequency of Social Gatherings with Friends and Family: Three times a week    Attends Religious Services: Never    Active Member of Clubs or Organizations: No    Attends Archivist Meetings: Never    Marital Status: Widowed          Objective:  Physical Exam: BP (!) 122/59   Pulse 88   Temp (!) 97.5 F (36.4 C) (Temporal)   Ht '5\' 4"'$  (1.626 m)   SpO2 96%   BMI 37.77 kg/m   Gen: No acute distress, resting comfortably CV: Regular rate and rhythm with no murmurs appreciated Pulm: Normal work of breathing,  clear to auscultation bilaterally with no crackles, wheezes, or rhonchi Neuro: Grossly normal, moves all extremities. In wheelchair. Psych: Normal affect and thought content  Time Spent: 45 minutes of total time was spent on the date of the encounter performing the following actions: chart review prior to seeing the patient including recent hospitalization, obtaining history, performing a medically necessary exam, counseling on the treatment plan, placing orders, and documenting in our EHR.  Algis Greenhouse. Jerline Pain, MD 07/01/2022 12:23 PM

## 2022-07-01 NOTE — Patient Instructions (Addendum)
It was very nice to see you today!  Let me know if you need any refills.   Please call the care manager at [phone].  You can use miralax as needed.   Please come back in 6 months or sooner if needed.   Take care, Dr Jerline Pain  PLEASE NOTE:  If you had any lab tests, please let us know if you have not heard back within a few days. You may see your results on mychart before we have a chance to review them but we will give you a call once they are reviewed by [name].   If we ordered any referrals today, please let us know if you have not heard from their office within the next week.   If you had any urgent prescriptions sent in today, please check with the pharmacy within an hour of our visit to make sure the prescription was transmitted appropriately.   Please try these tips to maintain a healthy lifestyle:  Eat at least 3 REAL meals and 1-2 snacks per day.  Aim for no more than 5 hours between eating.  If you eat breakfast, please do so within one hour of getting up.   Each meal should contain half fruits/vegetables, one quarter protein, and one quarter carbs (no bigger than a computer mouse)  Cut down on sweet beverages. This includes juice, soda, and sweet tea.   Drink at least 1 glass of water with each meal and aim for at least 8 glasses per day  Exercise at least 150 minutes every week.

## 2022-07-02 DIAGNOSIS — M84552D Pathological fracture in neoplastic disease, left femur, subsequent encounter for fracture with routine healing: Secondary | ICD-10-CM | POA: Diagnosis not present

## 2022-07-02 DIAGNOSIS — G8929 Other chronic pain: Secondary | ICD-10-CM | POA: Diagnosis not present

## 2022-07-02 DIAGNOSIS — C7951 Secondary malignant neoplasm of bone: Secondary | ICD-10-CM | POA: Diagnosis not present

## 2022-07-02 DIAGNOSIS — I10 Essential (primary) hypertension: Secondary | ICD-10-CM | POA: Diagnosis not present

## 2022-07-02 DIAGNOSIS — M9702XD Periprosthetic fracture around internal prosthetic left hip joint, subsequent encounter: Secondary | ICD-10-CM | POA: Diagnosis not present

## 2022-07-02 DIAGNOSIS — C50919 Malignant neoplasm of unspecified site of unspecified female breast: Secondary | ICD-10-CM | POA: Diagnosis not present

## 2022-07-05 ENCOUNTER — Telehealth: Payer: Self-pay | Admitting: *Deleted

## 2022-07-05 DIAGNOSIS — C7951 Secondary malignant neoplasm of bone: Secondary | ICD-10-CM | POA: Diagnosis not present

## 2022-07-05 DIAGNOSIS — M9702XD Periprosthetic fracture around internal prosthetic left hip joint, subsequent encounter: Secondary | ICD-10-CM | POA: Diagnosis not present

## 2022-07-05 DIAGNOSIS — I10 Essential (primary) hypertension: Secondary | ICD-10-CM | POA: Diagnosis not present

## 2022-07-05 DIAGNOSIS — M84552D Pathological fracture in neoplastic disease, left femur, subsequent encounter for fracture with routine healing: Secondary | ICD-10-CM | POA: Diagnosis not present

## 2022-07-05 DIAGNOSIS — C50919 Malignant neoplasm of unspecified site of unspecified female breast: Secondary | ICD-10-CM | POA: Diagnosis not present

## 2022-07-05 DIAGNOSIS — G8929 Other chronic pain: Secondary | ICD-10-CM | POA: Diagnosis not present

## 2022-07-05 NOTE — Telephone Encounter (Signed)
   Telephone encounter was:  Unsuccessful.  07/05/2022 Name: Miller Edgington Ruta MRN: [id number] DOB: 10/18/1940  Unsuccessful outbound call made today to assist with:  Food Insecurity  Outreach Attempt:  3rd Attempt.  Referral closed unable to contact patient.   Unable to leave message , if patient wants food assistance she needs to provide a number she can  be reached at   [place] [address] , [place] [address] Email : [email or web address]

## 2022-07-07 DIAGNOSIS — M84552D Pathological fracture in neoplastic disease, left femur, subsequent encounter for fracture with routine healing: Secondary | ICD-10-CM | POA: Diagnosis not present

## 2022-07-07 DIAGNOSIS — I10 Essential (primary) hypertension: Secondary | ICD-10-CM | POA: Diagnosis not present

## 2022-07-07 DIAGNOSIS — G8929 Other chronic pain: Secondary | ICD-10-CM | POA: Diagnosis not present

## 2022-07-07 DIAGNOSIS — C50919 Malignant neoplasm of unspecified site of unspecified female breast: Secondary | ICD-10-CM | POA: Diagnosis not present

## 2022-07-07 DIAGNOSIS — M9702XD Periprosthetic fracture around internal prosthetic left hip joint, subsequent encounter: Secondary | ICD-10-CM | POA: Diagnosis not present

## 2022-07-07 DIAGNOSIS — C7951 Secondary malignant neoplasm of bone: Secondary | ICD-10-CM | POA: Diagnosis not present

## 2022-07-11 ENCOUNTER — Telehealth: Payer: Self-pay | Admitting: Family Medicine

## 2022-07-11 ENCOUNTER — Telehealth: Payer: Self-pay

## 2022-07-11 DIAGNOSIS — M84552D Pathological fracture in neoplastic disease, left femur, subsequent encounter for fracture with routine healing: Secondary | ICD-10-CM | POA: Diagnosis not present

## 2022-07-11 DIAGNOSIS — I10 Essential (primary) hypertension: Secondary | ICD-10-CM | POA: Diagnosis not present

## 2022-07-11 DIAGNOSIS — G8929 Other chronic pain: Secondary | ICD-10-CM | POA: Diagnosis not present

## 2022-07-11 DIAGNOSIS — M9702XD Periprosthetic fracture around internal prosthetic left hip joint, subsequent encounter: Secondary | ICD-10-CM | POA: Diagnosis not present

## 2022-07-11 DIAGNOSIS — C7951 Secondary malignant neoplasm of bone: Secondary | ICD-10-CM | POA: Diagnosis not present

## 2022-07-11 DIAGNOSIS — C50919 Malignant neoplasm of unspecified site of unspecified female breast: Secondary | ICD-10-CM | POA: Diagnosis not present

## 2022-07-11 NOTE — Telephone Encounter (Signed)
.  Home Health Certification or Plan of Care Tracking  Is this a Certification or Plan of Care? yes  [facility] Agency: [place] Number:  [id number]  Has charge sheet been attached? yes  Where has form been placed:  In provider's box  Faxed to:   [phone]

## 2022-07-11 NOTE — Telephone Encounter (Signed)
Home Health Verbal Orders  Agency:  [facility]  Caller: [name]  Contact and title  Reason for call:  BP was high, did not pick up RX because she had no money, son will get meds. 184/72 at rest.  Frequency:    HH needs F2F w/in last 30 days

## 2022-07-11 NOTE — Telephone Encounter (Signed)
Returned Pt's call regarding after hours message. Pt states she needs more oxycodone rx. Last refill by Alda Lea on 2/1 to start 2/9. Reached out to [place] asking for her to call Pt, Lexine Baton stated she would call. Relayed to Pt that she should hear from NP.

## 2022-07-12 ENCOUNTER — Other Ambulatory Visit: Payer: Self-pay | Admitting: *Deleted

## 2022-07-12 ENCOUNTER — Telehealth: Payer: Self-pay | Admitting: Pharmacist

## 2022-07-12 DIAGNOSIS — I10 Essential (primary) hypertension: Secondary | ICD-10-CM | POA: Diagnosis not present

## 2022-07-12 DIAGNOSIS — C7951 Secondary malignant neoplasm of bone: Secondary | ICD-10-CM | POA: Diagnosis not present

## 2022-07-12 DIAGNOSIS — C50919 Malignant neoplasm of unspecified site of unspecified female breast: Secondary | ICD-10-CM | POA: Diagnosis not present

## 2022-07-12 DIAGNOSIS — M84552D Pathological fracture in neoplastic disease, left femur, subsequent encounter for fracture with routine healing: Secondary | ICD-10-CM | POA: Diagnosis not present

## 2022-07-12 DIAGNOSIS — M9702XD Periprosthetic fracture around internal prosthetic left hip joint, subsequent encounter: Secondary | ICD-10-CM | POA: Diagnosis not present

## 2022-07-12 DIAGNOSIS — G8929 Other chronic pain: Secondary | ICD-10-CM | POA: Diagnosis not present

## 2022-07-12 NOTE — Progress Notes (Signed)
Care Management & Coordination Services Pharmacy Team  Reason for Encounter: Appointment Reminder  Contacted patient to confirm telephone appointment with Erskine Emery, PharmD on 07/12/22 at 3:30PM (urgent referral). Unsuccessful outreach. Unable to leave voicemail.  Do you have any problems getting your medications? Yes If yes what types of problems are you experiencing? Financial barriers. Patient is in need of medication assistance and food insecurity concerns.   What is your top health concern you would like to discuss at your upcoming visit?   Have you seen any other providers since your last visit with PCP? No   Chart review:  Recent office visits:  07/01/22 Jacquiline Doe, MD - Family Medicine - Hip fracture - Continue current regimen. Follow up as scheduled.   01/27/22 Jacquiline Doe MD - Family Medicine - Hypothyroidism - azelastine (ASTELIN) 0.1 % nasal spray prescribed. Doxycyline prescribed for cellulitis. Follow up in 6 months.   Recent consult visits:  06/28/22 Collene Gobble, NP - Oncology - Breast Cancer - INR checked. Patient to return in 1 week for injection and labs. Medications refilled   05/06/22 Mayra Reel, NP - Oncology - Neoplasm related pain - Zofran for nausea. Oxycodone 5mg  every 8 hours for breakthrough pain prescribed. Follow up in 3-4 weeks.  05/03/22 Rachel Moulds MD - Hematology/Oncology - Metastasis to bone - Continue Fulvestrant injection q 4 weeks. Follow up as scheduled.   04/06/22 Durene Romans MD - Orthopedics - No notes available  03/09/22 Rachel Moulds MD - Oncology - Metastasis to bone - Continue current regimen. Follow up as scheduled.   02/04/22 Collene Gobble NP - Neoplasm of upper right breast - Referral placed for palliative care. Continue current regimen. Follow up in 4 weeks for injection.   Hospital visits: 03/14/22 - 03/17/22 Medication Reconciliation was completed by comparing discharge summary, patient's EMR and Pharmacy list,  and upon discussion with patient.  Admitted to the hospital on 03/14/22 due to left hip fracture. Discharge date was 03/17/22 to [facility]. Discharged from [facility].    New?Medications Started at [facility] Discharge:?? acetaminophen (TYLENOL) naloxone (NARCAN) oxyCODONE (Roxicodone)  Medication Changes at Hospital Discharge: None noted  Medications Discontinued at Hospital Discharge: azelastine 0.1 % nasal spray (ASTELIN) doxycycline 100 MG tablet (VIBRA-TABS) HYDROcodone-acetaminophen 5-325 MG tablet (NORCO/VICODIN) ketoconazole 2 % cream (NIZORAL) silver sulfADIAZINE 1 % cream (Silvadene)  Medications that remain the same after Hospital Discharge:??  All other medications will remain the same.     Star Rating Drugs:  Medication:   Last Fill: Day Supply    Lisinopril 10 MG tablet 06/24/22 14   Care Gaps: Annual wellness visit in last year? No  If Diabetic: Last eye exam / retinopathy screening: Last diabetic foot exam:  Future Appointments  Date Time Provider Department Center  07/13/2022 12:00 PM Erroll Luna, [facility] None  07/26/2022  1:15 PM [facility] LAB [facility] None  07/26/2022  1:45 PM Loa Socks, NP [facility] None  07/26/2022  2:00 PM [facility] PALLIATIVE CARE [facility] None  07/26/2022  2:30 PM [facility] FLUSH [facility] None  08/31/2022  4:15 PM Freddie Breech, DPM [facility]  09/26/2022  1:00 PM [facility] HEALTH COACH [facility]  10/25/2022  1:00 PM Carlus Pavlov, MD [facility] None  12/30/2022  2:00 PM Ardith Dark, MD [facility]    [facility] Concierge, [facility]

## 2022-07-12 NOTE — Patient Outreach (Addendum)
Received telephone call from [facility] stating she cannot afford her medications. Discussed Probation officer made referrals to [facility] care coordination team for RN, SW, and careguide for community resources. Made Mrs. Gola aware calls were attempted but appears she was unable to be reached. Mrs. Lichtman states her phone was turned off but is now on.  Discussed writer will alert [facility] care coordination referral staff that Mrs. Moulder should be able to be reached. Confirmed best telephone number for Mrs. Lawrance as [phone]. Discussed how Mrs. Shober's phone was in service upon SNF discharge of 06/24/22. Mrs. Labarre states phone was turned off after SNF discharge.   Also discussed Probation officer will make a [place] referral for medication affordability.   Mrs. Sopko endorses she has already been to her PCP appointment.   Message sent to [facility] care coordination team. Referral for [place] assist as well.   [name], MSN, RN,BSN [place] Acute Care Coordinator [phone] (Direct dial)

## 2022-07-12 NOTE — Telephone Encounter (Signed)
See note

## 2022-07-12 NOTE — Telephone Encounter (Signed)
Form sign and faxed to [phone]

## 2022-07-13 ENCOUNTER — Ambulatory Visit: Payer: Medicare Other | Admitting: Pharmacist

## 2022-07-13 ENCOUNTER — Telehealth: Payer: Self-pay | Admitting: *Deleted

## 2022-07-13 ENCOUNTER — Other Ambulatory Visit: Payer: Self-pay

## 2022-07-13 DIAGNOSIS — M84552D Pathological fracture in neoplastic disease, left femur, subsequent encounter for fracture with routine healing: Secondary | ICD-10-CM | POA: Diagnosis not present

## 2022-07-13 DIAGNOSIS — I10 Essential (primary) hypertension: Secondary | ICD-10-CM | POA: Diagnosis not present

## 2022-07-13 DIAGNOSIS — C50919 Malignant neoplasm of unspecified site of unspecified female breast: Secondary | ICD-10-CM | POA: Diagnosis not present

## 2022-07-13 DIAGNOSIS — G893 Neoplasm related pain (acute) (chronic): Secondary | ICD-10-CM

## 2022-07-13 DIAGNOSIS — G8929 Other chronic pain: Secondary | ICD-10-CM | POA: Diagnosis not present

## 2022-07-13 DIAGNOSIS — C7951 Secondary malignant neoplasm of bone: Secondary | ICD-10-CM | POA: Diagnosis not present

## 2022-07-13 DIAGNOSIS — Z515 Encounter for palliative care: Secondary | ICD-10-CM

## 2022-07-13 DIAGNOSIS — M84459A Pathological fracture, hip, unspecified, initial encounter for fracture: Secondary | ICD-10-CM

## 2022-07-13 DIAGNOSIS — M9702XD Periprosthetic fracture around internal prosthetic left hip joint, subsequent encounter: Secondary | ICD-10-CM | POA: Diagnosis not present

## 2022-07-13 MED ORDER — OXYCODONE HCL 5 MG PO TABS: 5.0000 mg | ORAL_TABLET | Freq: Four times a day (QID) | ORAL | 0 refills | Status: DC | PRN
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
## 2022-07-13 NOTE — Progress Notes (Signed)
  Care Coordination   Note   07/13/2022 Name: Shizue [facility] MRN: [id number] DOB: Sep 21, 1940  Loretta Doutt Luiz is a 82 y.o. year old female who sees Jerline Pain, Algis Greenhouse, MD for primary care. I reached out to [place] by phone today to offer care coordination services.  Ms. Fotheringham was given information about Care Coordination services today including:   The Care Coordination services include support from the care team which includes your Nurse Coordinator, Clinical Social Worker, or Pharmacist.  The Care Coordination team is here to help remove barriers to the health concerns and goals most important to you. Care Coordination services are voluntary, and the patient may decline or stop services at any time by request to their care team member.   Care Coordination Consent Status: Patient agreed to services and verbal consent obtained.   Follow up plan:  Telephone appointment with care coordination team member scheduled for:  07/15/2022 and 07/19/2022  Encounter Outcome:  Pt. Scheduled  Julian Hy, [place] Direct Dial: [phone]

## 2022-07-13 NOTE — Telephone Encounter (Signed)
Noted. Would like for them to continue to monitor and let us know if still elevated after restarting medications.  Alicia Vasquez. Jerline Pain, MD 07/13/2022 11:06 AM

## 2022-07-13 NOTE — Telephone Encounter (Signed)
LMOVM with [facility] [phone] for [name] to return my call

## 2022-07-13 NOTE — Telephone Encounter (Signed)
Gerald Stabs, [facility] [phone]) with Dr.Parker's office (pt PCP) called to notify this RN of the fact that pt was unable to physically pick up her medications and he was setting her up with a pharmacy delivery service. Per Lexine Baton, NP, previous order was canceled at [facility] and new order sent to correct pharmacy.

## 2022-07-14 ENCOUNTER — Telehealth: Payer: Self-pay | Admitting: *Deleted

## 2022-07-14 NOTE — Telephone Encounter (Signed)
Telephone encounter was:  Successful.  07/14/2022 Name: Alicia Vasquez MRN: [id number] DOB: Oct 20, 1940  Alicia Vasquez is a 82 y.o. year old female who is a primary care patient of Jerline Pain, Algis Greenhouse, MD . The community resource team was consulted for assistance with Transportation Needs  Patient signed waiver and it was put in the chart also asked a few addditional questions       Care guide performed the following interventions: Patient provided with information about care guide support team and interviewed to confirm resource needs.  Follow Up Plan:  No further follow up planned at this time. The patient has been provided with needed resources.  [place] [phone] [address] , [place] [address] Email : Ashby [email or web address]. [email or web address]            [name] DOB: [birth year] MRN: [id number]   RIDER WAIVER AND RELEASE OF LIABILITY  For purposes of improving physical access to our facilities, [place] is pleased to partner with third parties to provide [place] patients or other authorized individuals the option of convenient, on-demand ground transportation services (the [place]") through use of the technology service that enables users to request on-demand ground transportation from independent third-party providers.  By opting to use and accept these [employer], I, the undersigned, hereby agree on behalf of myself, and on behalf of any minor child using the Government social research officer for whom I am the parent or legal guardian, as follows:  Government social research officer provided to me are provided by independent third-party transportation providers who are not [employer] or employees and who are unaffiliated with [employer]. [place] is neither a transportation carrier nor a common or public carrier. [facility] has no control over the quality or safety of the  transportation that occurs as a result of the [employer]. [place] cannot guarantee that any third-party transportation provider will complete any arranged transportation service. [place] makes no representation, warranty, or guarantee regarding the reliability, timeliness, quality, safety, suitability, or availability of any of the Transport Services or that they will be error free. I fully understand that traveling by vehicle involves risks and dangers of serious bodily injury, including permanent disability, paralysis, and death. I agree, on behalf of myself and on behalf of any minor child using the Transport Services for whom I am the parent or legal guardian, that the entire risk arising out of my use of the [employer] remains solely with me, to the maximum extent permitted under applicable law. The [employer] are provided "as is" and "as available." [place] disclaims all representations and warranties, express, implied or statutory, not expressly set out in these terms, including the implied warranties of merchantability and fitness for a particular purpose. I hereby waive and release [place], its agents, employees, officers, directors, representatives, insurers, attorneys, assigns, successors, subsidiaries, and affiliates from any and all past, present, or future claims, demands, liabilities, actions, causes of action, or suits of any kind directly or indirectly arising from acceptance and use of the [employer]. I further waive and release [place] and its affiliates from all present and future liability and responsibility for any injury or death to persons or damages to property caused by or related to the use of the [employer]. I have read this Waiver and Release of Liability, and I understand the terms used in it and their legal significance. This Waiver is freely  and voluntarily given with the understanding that my right (as well as the  right of any minor child for whom I am the parent or legal guardian using the [employer]) to legal recourse against [place] in connection with the [employer] is knowingly surrendered in return for use of these services.   I attest that I read the consent document to Nelle Don Brockwell, gave Ms. Tagg the opportunity to ask questions and answered the questions asked (if any). I affirm that Nelle Don Mensinger then provided consent for she's participation in this program.     Bonnielee Haff
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
## 2022-07-15 ENCOUNTER — Ambulatory Visit: Payer: Self-pay
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
## 2022-07-19 ENCOUNTER — Ambulatory Visit: Payer: Self-pay | Admitting: Licensed Clinical Social Worker

## 2022-07-19 ENCOUNTER — Telehealth: Payer: Self-pay | Admitting: Licensed Clinical Social Worker

## 2022-07-19 DIAGNOSIS — C7951 Secondary malignant neoplasm of bone: Secondary | ICD-10-CM | POA: Diagnosis not present

## 2022-07-19 DIAGNOSIS — M9702XD Periprosthetic fracture around internal prosthetic left hip joint, subsequent encounter: Secondary | ICD-10-CM | POA: Diagnosis not present

## 2022-07-19 DIAGNOSIS — M84552D Pathological fracture in neoplastic disease, left femur, subsequent encounter for fracture with routine healing: Secondary | ICD-10-CM | POA: Diagnosis not present

## 2022-07-19 DIAGNOSIS — G8929 Other chronic pain: Secondary | ICD-10-CM | POA: Diagnosis not present

## 2022-07-19 DIAGNOSIS — C50919 Malignant neoplasm of unspecified site of unspecified female breast: Secondary | ICD-10-CM | POA: Diagnosis not present

## 2022-07-19 DIAGNOSIS — I10 Essential (primary) hypertension: Secondary | ICD-10-CM | POA: Diagnosis not present

## 2022-07-19 NOTE — Patient Outreach (Signed)
  Care Coordination   07/19/2022 Name: Alicia Vasquez [facility] MRN: [id number] DOB: 07/06/1940   Care Coordination Outreach Attempts:  An unsuccessful telephone outreach was attempted for a scheduled appointment today.  Follow Up Plan:  Additional outreach attempts will be made to offer the patient care coordination information and services.   Encounter Outcome:  No Answer   Care Coordination Interventions:  No, not indicated    Christa See, MSW, [email or web address] Phone [phone] 3:07 PM

## 2022-07-20 ENCOUNTER — Ambulatory Visit: Payer: Self-pay
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
## 2022-07-21 ENCOUNTER — Telehealth: Payer: Self-pay

## 2022-07-21 ENCOUNTER — Other Ambulatory Visit: Payer: Self-pay

## 2022-07-21 DIAGNOSIS — Z515 Encounter for palliative care: Secondary | ICD-10-CM

## 2022-07-21 DIAGNOSIS — G893 Neoplasm related pain (acute) (chronic): Secondary | ICD-10-CM

## 2022-07-21 DIAGNOSIS — C7951 Secondary malignant neoplasm of bone: Secondary | ICD-10-CM
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
## 2022-07-21 NOTE — Progress Notes (Unsigned)
Orders placed per Lexine Baton, NP
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
## 2022-07-25 DIAGNOSIS — M9702XD Periprosthetic fracture around internal prosthetic left hip joint, subsequent encounter: Secondary | ICD-10-CM | POA: Diagnosis not present

## 2022-07-25 DIAGNOSIS — Z86718 Personal history of other venous thrombosis and embolism: Secondary | ICD-10-CM | POA: Diagnosis not present

## 2022-07-25 DIAGNOSIS — C7951 Secondary malignant neoplasm of bone: Secondary | ICD-10-CM | POA: Diagnosis not present

## 2022-07-25 DIAGNOSIS — N200 Calculus of kidney: Secondary | ICD-10-CM | POA: Diagnosis not present

## 2022-07-25 DIAGNOSIS — G8929 Other chronic pain: Secondary | ICD-10-CM | POA: Diagnosis not present

## 2022-07-25 DIAGNOSIS — Z7901 Long term (current) use of anticoagulants: Secondary | ICD-10-CM | POA: Diagnosis not present

## 2022-07-25 DIAGNOSIS — E039 Hypothyroidism, unspecified: Secondary | ICD-10-CM | POA: Diagnosis not present

## 2022-07-25 DIAGNOSIS — C50919 Malignant neoplasm of unspecified site of unspecified female breast: Secondary | ICD-10-CM | POA: Diagnosis not present

## 2022-07-25 DIAGNOSIS — W19XXXD Unspecified fall, subsequent encounter: Secondary | ICD-10-CM | POA: Diagnosis not present

## 2022-07-25 DIAGNOSIS — I890 Lymphedema, not elsewhere classified: Secondary | ICD-10-CM | POA: Diagnosis not present

## 2022-07-25 DIAGNOSIS — Z9181 History of falling: Secondary | ICD-10-CM | POA: Diagnosis not present

## 2022-07-25 DIAGNOSIS — Z853 Personal history of malignant neoplasm of breast: Secondary | ICD-10-CM | POA: Diagnosis not present

## 2022-07-25 DIAGNOSIS — I10 Essential (primary) hypertension: Secondary | ICD-10-CM | POA: Diagnosis not present

## 2022-07-25 DIAGNOSIS — Z79891 Long term (current) use of opiate analgesic: Secondary | ICD-10-CM | POA: Diagnosis not present

## 2022-07-25 DIAGNOSIS — M1990 Unspecified osteoarthritis, unspecified site: Secondary | ICD-10-CM | POA: Diagnosis not present

## 2022-07-25 DIAGNOSIS — C6951 Malignant neoplasm of right lacrimal gland and duct: Secondary | ICD-10-CM | POA: Diagnosis not present

## 2022-07-25 DIAGNOSIS — F419 Anxiety disorder, unspecified: Secondary | ICD-10-CM | POA: Diagnosis not present

## 2022-07-25 DIAGNOSIS — M84552D Pathological fracture in neoplastic disease, left femur, subsequent encounter for fracture with routine healing: Secondary | ICD-10-CM | POA: Diagnosis not present

## 2022-07-25 NOTE — Progress Notes (Unsigned)
[place]  [phone] [phone]) [phone]   Name: Alicia Vasquez Date: 07/25/2022 MRN: [id number]  DOB: [birth year]  Patient Care Team: [name], MD as PCP - General (Family Medicine) Philemon Kingdom, MD as Consulting Physician (Internal Medicine) Caprice Renshaw, MD as Referring Physician (Internal Medicine) Regal, Tamala Fothergill, DPM as Consulting Physician (Podiatry) Bjorn Loser, MD as Consulting Physician (Urology) Benay Pike, MD as Consulting Physician (Hematology and Oncology) Paralee Cancel, MD as Consulting Physician (Orthopedic Surgery) Jon Billings, RN as [place] Management    INTERVAL HISTORY: Alicia Vasquez is a 82 y.o. female with oncology medical history including right breast cancer ER positive s/p adjuvant radiation (2015), found to have metastatic disease August 2018 s/p left total hip replacement, multiple compression fractures, currently on fulvestrant therapy.  Palliative ask to see for symptom management and goals of care.   SOCIAL HISTORY:     reports that she has never smoked. She has never used smokeless tobacco. She reports that she does not drink alcohol and does not use drugs.  ADVANCE DIRECTIVES:    CODE STATUS:   PAST MEDICAL HISTORY: Past Medical History:  Diagnosis Date   Allergy    Anxiety    Arthritis    Blood transfusion without reported diagnosis    Breast cancer ([place]) 07/12/2013   Invasive Mammary Carcinoma   DVT (deep vein thrombosis) in pregnancy    Hypertension    Hypothyroid    Metastatic cancer to bone ([place]) dx'd 12/2016   hip   Personal history of radiation therapy    Pneumonia    PONV (postoperative nausea and vomiting)    Radiation 11/21/13-01/07/14   Right Breast/Supraclavicular    ALLERGIES:  is allergic to anesthetics, amide; benadryl [diphenhydramine hcl]; carbocaine [mepivacaine hcl]; codeine; epinephrine; sulfa antibiotics;  diphenhydramine; latex; penicillins; and tramadol.  MEDICATIONS:  Current Outpatient Medications  Medication Sig Dispense Refill   cholecalciferol (VITAMIN D3) 25 MCG (1000 UNIT) tablet Take 1,000 Units by mouth daily.     levothyroxine (SYNTHROID) 125 MCG tablet Take 1 tablet (125 mcg total) by mouth daily before breakfast. 90 tablet 0   lisinopril (PRINIVIL) 10 MG tablet Take 1 tablet (10 mg total) by mouth daily. 90 tablet 1   naloxone (NARCAN) 0.4 MG/ML injection Inject 1 mL (0.4 mg total) into the muscle as needed. 1 mL 0   ondansetron (ZOFRAN) 8 MG tablet Take 1 tablet (8 mg total) by mouth every 8 (eight) hours as needed for nausea or vomiting. 90 tablet 0   oxyCODONE (OXY IR/ROXICODONE) 5 MG immediate release tablet Take 1 tablet (5 mg total) by mouth every 6 (six) hours as needed for severe pain. 45 tablet 0   warfarin (COUMADIN) 4 MG tablet Take 1-1.5 tablets (4-6 mg total) by mouth as directed. Take 4 mg Tuesday, Thursday, Saturday and Sunday, and 6 mg Monday, Wednesday, and Friday 60 tablet 0   No current facility-administered medications for this visit.    VITAL SIGNS: There were no vitals taken for this visit. There were no vitals filed for this visit.  Estimated body mass index is 37.77 kg/m as calculated from the following:   Height as of 07/01/22: '5\' 4"'$  (1.626 m).   Weight as of 03/15/22: 220 lb 0.3 oz (99.8 kg).   PERFORMANCE STATUS (ECOG) : 2 - Symptomatic, <50% confined to bed   IMPRESSION:   I discussed the importance of continued conversation with family and their medical  providers regarding overall plan of care and treatment options, ensuring decisions are within the context of the patients values and GOCs.  PLAN: Oxycodone 5 mg every 8 hours.  Patient may receive additional 5 mg every 6 hours as needed for breakthrough pain. Zofran 8 mg every 8 hours as needed for nausea.  Patient states she generally has nausea at least 2-3 days post Faslodex  injection Colace twice daily Ongoing symptom management and support as needed I will plan to see patient back in 3-4 weeks in collaboration with other oncology appointments.  I will give her a call in 1 week to closely evaluate her pain needs.    Patient expressed understanding and was in agreement with this plan. She also understands that She can call the clinic at any time with any questions, concerns, or complaints.   Any controlled substances utilized were prescribed in the context of palliative care. PDMP has been reviewed.    Time Total: 20 min   Visit consisted of counseling and education dealing with the complex and emotionally intense issues of symptom management and palliative care in the setting of serious and potentially life-threatening illness.Greater than 50%  of this time was spent counseling and coordinating care related to the above assessment and plan.  Alda Lea, AGPCNP-BC  Palliative Medicine Team/[place]

## 2022-07-26 ENCOUNTER — Inpatient Hospital Stay (HOSPITAL_BASED_OUTPATIENT_CLINIC_OR_DEPARTMENT_OTHER): Payer: Medicare Other | Admitting: Adult Health

## 2022-07-26 ENCOUNTER — Inpatient Hospital Stay (HOSPITAL_BASED_OUTPATIENT_CLINIC_OR_DEPARTMENT_OTHER): Payer: Medicare Other | Admitting: Nurse Practitioner

## 2022-07-26 ENCOUNTER — Encounter: Payer: Self-pay | Admitting: Nurse Practitioner

## 2022-07-26 ENCOUNTER — Inpatient Hospital Stay: Payer: Medicare Other | Attending: Oncology

## 2022-07-26 ENCOUNTER — Encounter: Payer: Self-pay | Admitting: Adult Health

## 2022-07-26 ENCOUNTER — Inpatient Hospital Stay: Payer: Medicare Other

## 2022-07-26 VITALS — BP 157/74 | HR 90 | Temp 98.0°F | Resp 18

## 2022-07-26 VITALS — BP 138/54 | HR 97 | Temp 97.9°F | Resp 16 | Ht 64.0 in

## 2022-07-26 DIAGNOSIS — K5900 Constipation, unspecified: Secondary | ICD-10-CM

## 2022-07-26 DIAGNOSIS — Z5111 Encounter for antineoplastic chemotherapy: Secondary | ICD-10-CM | POA: Diagnosis not present

## 2022-07-26 DIAGNOSIS — C7951 Secondary malignant neoplasm of bone: Secondary | ICD-10-CM

## 2022-07-26 DIAGNOSIS — C50411 Malignant neoplasm of upper-outer quadrant of right female breast: Secondary | ICD-10-CM

## 2022-07-26 DIAGNOSIS — Z79891 Long term (current) use of opiate analgesic: Secondary | ICD-10-CM | POA: Insufficient documentation

## 2022-07-26 DIAGNOSIS — R530 Neoplastic (malignant) related fatigue: Secondary | ICD-10-CM | POA: Diagnosis not present

## 2022-07-26 DIAGNOSIS — Z170 Estrogen receptor positive status [ER+]: Secondary | ICD-10-CM | POA: Insufficient documentation

## 2022-07-26 DIAGNOSIS — G893 Neoplasm related pain (acute) (chronic): Secondary | ICD-10-CM

## 2022-07-26 DIAGNOSIS — Z79811 Long term (current) use of aromatase inhibitors: Secondary | ICD-10-CM | POA: Diagnosis not present

## 2022-07-26 DIAGNOSIS — M899 Disorder of bone, unspecified: Secondary | ICD-10-CM

## 2022-07-26 DIAGNOSIS — Z515 Encounter for palliative care: Secondary | ICD-10-CM | POA: Diagnosis not present

## 2022-07-26 DIAGNOSIS — Z7901 Long term (current) use of anticoagulants: Secondary | ICD-10-CM | POA: Insufficient documentation

## 2022-07-26 DIAGNOSIS — I82542 Chronic embolism and thrombosis of left tibial vein: Secondary | ICD-10-CM | POA: Diagnosis not present

## 2022-07-26 DIAGNOSIS — M84459A Pathological fracture, hip, unspecified, initial encounter for fracture: Secondary | ICD-10-CM

## 2022-07-26 DIAGNOSIS — M84659P Pathological fracture in other disease, hip, unspecified, subsequent encounter for fracture with malunion: Secondary | ICD-10-CM

## 2022-07-26 DIAGNOSIS — E038 Other specified hypothyroidism: Secondary | ICD-10-CM

## 2022-07-26 LAB — CBC WITH DIFFERENTIAL (CANCER CENTER ONLY)
Abs Immature Granulocytes: 0.04 10*3/uL (ref 0.00–0.07)
Basophils Absolute: 0.1 10*3/uL (ref 0.0–0.1)
Basophils Relative: 1 %
Eosinophils Absolute: 0.4 10*3/uL (ref 0.0–0.5)
Eosinophils Relative: 4 %
HCT: 38.4 % (ref 36.0–46.0)
Hemoglobin: 12.2 g/dL (ref 12.0–15.0)
Immature Granulocytes: 1 %
Lymphocytes Relative: 17 %
Lymphs Abs: 1.5 10*3/uL (ref 0.7–4.0)
MCH: 28.8 pg (ref 26.0–34.0)
MCHC: 31.8 g/dL (ref 30.0–36.0)
MCV: 90.6 fL (ref 80.0–100.0)
Monocytes Absolute: 1 10*3/uL (ref 0.1–1.0)
Monocytes Relative: 12 %
Neutro Abs: 5.5 10*3/uL (ref 1.7–7.7)
Neutrophils Relative %: 65 %
Platelet Count: 271 10*3/uL (ref 150–400)
RBC: 4.24 MIL/uL (ref 3.87–5.11)
RDW: 15.2 % (ref 11.5–15.5)
WBC Count: 8.4 10*3/uL (ref 4.0–10.5)
nRBC: 0 % (ref 0.0–0.2)

## 2022-07-26 LAB — PROTIME-INR
INR: 1.2 (ref 0.8–1.2)
Prothrombin Time: 14.9 seconds (ref 11.4–15.2)

## 2022-07-26 LAB — CMP (CANCER CENTER ONLY)
ALT: 11 U/L (ref 0–44)
AST: 13 U/L — ABNORMAL LOW (ref 15–41)
Albumin: 3.8 g/dL (ref 3.5–5.0)
Alkaline Phosphatase: 169 U/L — ABNORMAL HIGH (ref 38–126)
Anion gap: 4 — ABNORMAL LOW (ref 5–15)
BUN: 15 mg/dL (ref 8–23)
CO2: 30 mmol/L (ref 22–32)
Calcium: 10.5 mg/dL — ABNORMAL HIGH (ref 8.9–10.3)
Chloride: 106 mmol/L (ref 98–111)
Creatinine: 0.8 mg/dL (ref 0.44–1.00)
GFR, Estimated: >60 mL/min (ref >60)
Glucose, Bld: 101 mg/dL — ABNORMAL HIGH (ref 70–99)
Potassium: 4.7 mmol/L (ref 3.5–5.1)
Sodium: 140 mmol/L (ref 135–145)
Total Bilirubin: 0.4 mg/dL (ref 0.3–1.2)
Total Protein: 6.7 g/dL (ref 6.5–8.1)

## 2022-07-26 LAB — RAPID URINE DRUG SCREEN, HOSP PERFORMED
Amphetamines: NOT DETECTED
Barbiturates: NOT DETECTED
Benzodiazepines: NOT DETECTED
Cocaine: NOT DETECTED
Opiates: NOT DETECTED
Tetrahydrocannabinol: NOT DETECTED

## 2022-07-26 MED ORDER — WARFARIN SODIUM 4 MG PO TABS: 6.0000 mg | ORAL_TABLET | Freq: Every day | ORAL | 2 refills | Status: DC

## 2022-07-26 MED ORDER — FULVESTRANT 250 MG/5ML IM SOSY
500.0000 mg | PREFILLED_SYRINGE | Freq: Once | INTRAMUSCULAR | Status: AC
  Administered 2022-07-26: 500 mg via INTRAMUSCULAR
  Filled 2022-07-26: qty 10

## 2022-07-26 NOTE — Patient Instructions (Signed)

## 2022-07-26 NOTE — Progress Notes (Unsigned)
[facility] Follow up:    Alicia Barrack, MD [address] [name] [address]   DIAGNOSIS: Cancer Staging  Malignant neoplasm of upper-outer quadrant of right breast in female, estrogen receptor positive ([place]) Staging form: Breast, AJCC 7th Edition - Clinical: Stage IIA (T1, N1, cM0) - Unsigned Specimen type: Core Needle Biopsy Histopathologic type: 9931 Laterality: Right Staging comments: Staged at breast conference 07/24/13.  - Pathologic: Stage IV (M1) - Unsigned Specimen type: Core Needle Biopsy Histopathologic type: 9931 Laterality: Right   SUMMARY OF ONCOLOGIC HISTORY: 82 y.o. [place] woman status post right breast upper outer quadrant lumpectomy and sentinel lymph node sampling 09/09/2013 for an mpT1c pN1a, stage IIA invasive ductal carcinoma, estrogen and progesterone receptor both 100% positive with strong staining intensity, MIB-1 of 17% and no HER-2 amplification   (1) additional surgery for margin clearance 09/16/2013 obtained negative margins   (2) Oncotype DX recurrence score of 4 predicts a risk of outside the breast recurrence within 10 years of 7% if the patient's only systemic therapy is tamoxifen for 5 years. It also predicts no benefit from chemotherapy   (3) adjuvant radiation completed 01/07/2014   (4) anastrozole started 02/27/2014 stopped within 2 weeks because of arm swelling.              (a) bone density April 2016 showed osteopenia, with a t-score of -1.6             (b) anastrozole resumed 12/17/2015             (c) Bone density 09/20/2016 showed a T score of -2.2   (5) history of left lower extremity DVT 11/23/2012, initially on rivaroxaban, which caused chest pain, switch to Coumadin July 2014             METASTATIC DISEASE: August 2018 (6) status post left total hip replacement 01/16/2017 for estrogen receptor positive adenocarcinoma.             (a) CA 27-29 was 46.4 as of 04/18/2017             (b) chest CT scan  05/02/2017 shows no lung or liver lesion concern; it does show aortic atherosclerosis             (c) baseline bone scan 05/02/2017 was negative             (d) PET scan 09/12/2017 shows no active disease, including bone             (e) PET scan and CT chest on 05/29/2018 show no active disease             (f) chest CT and bone scan 03/18/2019 showed no evidence of active disease             (g) lumbar spine MRI 01/06/2021 shows multiple compression fractures but no obvious metastases; noncontrast cervical spine and head CT scans 01/04/2021 showed no evidence of neoplastic disease   (7) fulvestrant started 04/18/2017   (8) denosumab/Xgeva started 05/16/2017             (a) changed to every 12-weeks after 10/03/2017 dose              (b) held starting with 06/13/2018 dose due to dental concerns   (9) unprovoked deep vein thrombosis involving the left posterior tibial v documented 11/23/2012, resolved on repeat 06/27/2013 and not recurrent on multiple Dopplers since, most recent 03/06/2018             (  a) on chronic anticoagulation with warfarin given ongoing risks (stage IV breast cancer, relative immobility secondary to claudication  CURRENT THERAPY:  INTERVAL HISTORY: Alicia Vasquez 82 y.o. female returns for    Patient Active Problem List   Diagnosis Date Noted   GERD (gastroesophageal reflux disease) 06/17/2021   Bilateral leg edema 06/03/2021   Pressure injury of left buttock, stage 1 06/03/2021   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   Weakness 04/27/2021   Morbid (severe) obesity due to excess calories ([place]) 05/31/2019   Aortic atherosclerosis ([place]) 06/13/2017   Malignant neoplasm metastatic to bone ([place]) 01/25/2017   Endometrial hyperplasia 01/16/2017   Lytic bone lesion of left femur 01/15/2017   Hip fracture, pathological ([place]) 01/15/2017   Lymphedema 09/17/2015   Long term current use of anticoagulant therapy 08/23/2015   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 ([place]) 06/18/2015   Bilateral knee pain 04/03/2015   Arm edema 08/28/2014   Vitamin D deficiency 04/23/2014   Hyperparathyroidism ([place]) 04/23/2014   Depression 07/18/2013   Malignant neoplasm of upper-outer quadrant of right breast in female, estrogen receptor positive ([place]) 07/15/2013   Overactive bladder 01/30/2013   Primary hypercoagulable state ([place]) 12/19/2012   Essential hypertension 09/27/2011   Hearing loss 09/27/2011   Seasonal allergies 09/27/2011   Hypothyroidism 08/26/2011    is allergic to anesthetics, amide; benadryl [diphenhydramine hcl]; carbocaine [mepivacaine hcl]; codeine; epinephrine; sulfa antibiotics; diphenhydramine; latex; penicillins; and tramadol.  MEDICAL HISTORY: Past Medical History:  Diagnosis Date   Allergy    Anxiety    Arthritis    Blood transfusion without reported diagnosis    Breast cancer ([place]) 07/12/2013   Invasive Mammary Carcinoma   DVT (deep vein thrombosis) in pregnancy    Hypertension    Hypothyroid    Metastatic cancer to bone ([place]) dx'd 12/2016   hip   Personal history of radiation therapy    Pneumonia    PONV (postoperative nausea and vomiting)    Radiation 11/21/13-01/07/14   Right Breast/Supraclavicular    SURGICAL HISTORY: Past Surgical History:  Procedure Laterality Date   BREAST LUMPECTOMY Left 2015   BREAST LUMPECTOMY WITH RADIOACTIVE SEED LOCALIZATION Right 09/09/2013   Procedure: BREAST LUMPECTOMY WITH RADIOACTIVE SEED LOCALIZATION WITH AXILLARY NODE EXCISION;  Surgeon: [name], MD;  Location: [facility];  Service: General;  Laterality: Right;   CYSTOSCOPY W/ URETERAL STENT PLACEMENT Right 01/07/2021   Procedure: CYSTOSCOPY WITH RETROGRADE PYELOGRAM/URETERAL STENT PLACEMENT;  Surgeon: Bjorn Loser, MD;  Location: [facility];  Service: Urology;  Laterality: Right;   CYSTOSCOPY/URETEROSCOPY/HOLMIUM LASER/STENT PLACEMENT Right 02/08/2021   Procedure: CYSTOSCOPY, RIGHT  URETEROSCOPY, RIGHT RETRGRADE PYELOGRAM, HOLMIUM LASER/STENT PLACEMENT;  Surgeon: Lucas Mallow, MD;  Location: [facility];  Service: Urology;  Laterality: Right;   DENTAL SURGERY  04/19/2012   13 TEETH REMOVED   DILATION AND CURETTAGE OF UTERUS     IR RADIOLOGIST EVAL & MGMT  01/21/2021   ORIF PERIPROSTHETIC FRACTURE Left 01/31/2017   Procedure: REVISION and OPEN REDUCTION INTERNAL FIXATION (ORIF) PERIPROSTHETIC FRACTURE LEFT HIP;  Surgeon: Paralee Cancel, MD;  Location: [facility];  Service: Orthopedics;  Laterality: Left;  120 mins   RE-EXCISION OF BREAST LUMPECTOMY Right 09/24/2013   Procedure: RE-EXCISION OF RIGHT BREAST LUMPECTOMY;  Surgeon: [name], MD;  Location: [facility];  Service: General;  Laterality: Right;   TOTAL HIP ARTHROPLASTY Left 01/16/2017   Procedure: TOTAL HIP ARTHROPLASTY POSTERIOR;  Surgeon: Paralee Cancel, MD;  Location: [facility];  Service: Orthopedics;  Laterality: Left;    SOCIAL HISTORY: Social History   Socioeconomic History   Marital status: Widowed    Spouse name: Not on file   Number of children: 1   Years of education: Not on file   Highest education level: Not on file  Occupational History   Not on file  Tobacco Use   Smoking status: Never   Smokeless tobacco: Never  Vaping Use   Vaping Use: Never used  Substance and Sexual Activity   Alcohol use: No    Alcohol/week: 0.0 standard drinks of alcohol   Drug use: No   Sexual activity: Never  Other Topics Concern   Not on file  Social History Narrative   Exercise: yard work.   Social Determinants of Health   Financial Resource Strain: High Risk (07/14/2022)   Overall Financial Resource Strain (CARDIA)    Difficulty of Paying Living Expenses: Hard  Food Insecurity: No Food Insecurity (07/15/2022)   Hunger Vital Sign    Worried About Running Out of Food in the Last Year: Never true    Ran Out of Food in the Last Year: Never true  Transportation Needs: Unmet Transportation Needs  (07/15/2022)   PRAPARE - Hydrologist (Medical): Yes    Lack of Transportation (Non-Medical): No  Physical Activity: Inactive (09/13/2021)   Exercise Vital Sign    Days of Exercise per Week: 0 days    Minutes of Exercise per Session: 0 min  Stress: No Stress Concern Present (09/13/2021)   [place]    Feeling of Stress : Only a little  Social Connections: Socially Isolated (09/13/2021)   Social Connection and Isolation Panel [NHANES]    Frequency of Communication with Friends and Family: More than three times a week    Frequency of Social Gatherings with Friends and Family: Three times a week    Attends Religious Services: Never    Active Member of Clubs or Organizations: No    Attends Archivist Meetings: Never    Marital Status: Widowed  Intimate Partner Violence: Not At Risk (03/15/2022)   Humiliation, Afraid, Rape, and Kick questionnaire    Fear of Current or Ex-Partner: No    Emotionally Abused: No    Physically Abused: No    Sexually Abused: No    FAMILY HISTORY: Family History  Problem Relation Age of Onset   Heart disease Brother    Colon cancer Brother    Prostate cancer Brother     Review of Systems - Oncology    PHYSICAL EXAMINATION  ECOG PERFORMANCE STATUS: {CHL ONC ECOG [id number]  There were no vitals filed for this visit.  Physical Exam  LABORATORY DATA:  CBC    Component Value Date/Time   WBC 9.7 06/28/2022 1223   RBC 4.15 06/28/2022 1223   HGB 11.9 (L) 06/28/2022 1223   HGB 13.0 02/04/2022 1245   HGB 11.6 05/16/2017 1417   HCT 36.8 06/28/2022 1223   HCT 36.7 05/16/2017 1417   PLT 309 06/28/2022 1223   PLT 335 02/04/2022 1245   PLT 323 05/16/2017 1417   MCV 88.7 06/28/2022 1223   MCV 80.8 05/16/2017 1417   MCH 28.7 06/28/2022 1223   MCHC 32.3 06/28/2022 1223   RDW 15.2 06/28/2022 1223   RDW 16.8 (H) 05/16/2017 1417   LYMPHSABS 1.2  06/28/2022 1223   LYMPHSABS 1.1 05/16/2017 1417   MONOABS 1.1 (H) 06/28/2022 1223  MONOABS 0.8 05/16/2017 1417   EOSABS 0.2 06/28/2022 1223   EOSABS 0.2 05/16/2017 1417   BASOSABS 0.1 06/28/2022 1223   BASOSABS 0.1 05/16/2017 1417    CMP     Component Value Date/Time   NA 137 06/28/2022 1223   NA 141 05/16/2017 1416   K 4.2 06/28/2022 1223   K 3.8 05/16/2017 1416   CL 104 06/28/2022 1223   CO2 26 06/28/2022 1223   CO2 27 05/16/2017 1416   GLUCOSE 113 (H) 06/28/2022 1223   GLUCOSE 140 05/16/2017 1416   BUN 22 06/28/2022 1223   BUN 13.2 05/16/2017 1416   CREATININE 0.89 06/28/2022 1223   CREATININE 0.95 06/14/2021 1049   CREATININE 0.80 09/22/2017 1555   CREATININE 0.8 05/16/2017 1416   CALCIUM 11.4 (H) 06/28/2022 1223   CALCIUM 11.0 (H) 05/16/2017 1416   PROT 6.9 06/28/2022 1223   PROT 6.9 05/16/2017 1416   ALBUMIN 3.7 06/28/2022 1223   ALBUMIN 3.6 05/16/2017 1416   AST 13 (L) 06/28/2022 1223   AST 10 (L) 06/14/2021 1049   AST 13 05/16/2017 1416   ALT 10 06/28/2022 1223   ALT 10 06/14/2021 1049   ALT <6 05/16/2017 1416   ALKPHOS 155 (H) 06/28/2022 1223   ALKPHOS 130 05/16/2017 1416   BILITOT 0.4 06/28/2022 1223   BILITOT 0.4 06/14/2021 1049   BILITOT 0.31 05/16/2017 1416   GFRNONAA >60 06/28/2022 1223   GFRNONAA >60 06/14/2021 1049   GFRNONAA 75 08/19/2015 1602   GFRAA >60 08/07/2019 1305   GFRAA >60 03/21/2018 1417   GFRAA 87 08/19/2015 1602       PENDING LABS:   RADIOGRAPHIC STUDIES:  No results found.   PATHOLOGY:     ASSESSMENT and THERAPY PLAN:   No problem-specific Assessment & Plan notes found for this encounter.   No orders of the defined types were placed in this encounter.   All questions were answered. The patient knows to call the clinic with any problems, questions or concerns. We can certainly see the patient much sooner if necessary. This note was electronically signed. Scot Dock, NP 07/26/2022

## 2022-07-27 ENCOUNTER — Ambulatory Visit: Payer: Self-pay

## 2022-07-27 DIAGNOSIS — M84552D Pathological fracture in neoplastic disease, left femur, subsequent encounter for fracture with routine healing: Secondary | ICD-10-CM | POA: Diagnosis not present

## 2022-07-27 DIAGNOSIS — M9702XD Periprosthetic fracture around internal prosthetic left hip joint, subsequent encounter: Secondary | ICD-10-CM | POA: Diagnosis not present

## 2022-07-27 DIAGNOSIS — C7951 Secondary malignant neoplasm of bone: Secondary | ICD-10-CM | POA: Diagnosis not present

## 2022-07-27 DIAGNOSIS — I10 Essential (primary) hypertension: Secondary | ICD-10-CM | POA: Diagnosis not present

## 2022-07-27 DIAGNOSIS — C50919 Malignant neoplasm of unspecified site of unspecified female breast: Secondary | ICD-10-CM | POA: Diagnosis not present

## 2022-07-27 DIAGNOSIS — G8929 Other chronic pain: Secondary | ICD-10-CM | POA: Diagnosis not present

## 2022-07-27 MED ORDER — OXYCODONE HCL 5 MG PO TABS: 5.0000 mg | ORAL_TABLET | Freq: Four times a day (QID) | ORAL | 0 refills | Status: DC | PRN
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
## 2022-07-27 NOTE — Addendum Note (Signed)
Addended by: Jimmy Footman on: 07/27/2022 10:33 AM   Modules accepted: Orders
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
## 2022-07-28 ENCOUNTER — Encounter: Payer: Self-pay | Admitting: Licensed Clinical Social Worker

## 2022-07-28 ENCOUNTER — Telehealth: Payer: Self-pay | Admitting: Hematology and Oncology

## 2022-07-28 ENCOUNTER — Encounter: Payer: Self-pay | Admitting: Oncology

## 2022-07-28 ENCOUNTER — Ambulatory Visit: Payer: Medicare Other | Admitting: Family Medicine

## 2022-07-28 NOTE — Patient Outreach (Signed)
  Care Coordination   Follow Up/Collaborative Visit Note   07/28/2022 Name: Alicia Vasquez [facility] MRN: [id number] DOB: 04/23/1941  Alicia Vasquez is a 82 y.o. year old female who sees Jerline Pain, Algis Greenhouse, MD for primary care. I  spoke with LCSW [place]  What matters to the patients health and wellness today?  Patient was not interviewed or contacted during this encounter    Goals Addressed             This Visit's Progress    Obtain Stable and Safe Housing   On track    Activities and task to complete in order to accomplish goals.   LCSW coordinated with [place] LCSW to assist with patient care needs           SDOH assessments and interventions completed:  No     Care Coordination Interventions:  Yes, provided  Interventions Today    Flowsheet Row Most Recent Value  Chronic Disease   Chronic disease during today's visit Hypertension (HTN), Other  [Depression]  General Interventions   General Interventions Discussed/Reviewed Communication with  Communication with Social Work  [Discussed previous and current patient care needs]       Follow up plan: Follow up call scheduled for 07/29/22    Encounter Outcome:  Pt. Visit Completed   Christa See, MSW, [email or web address] Phone [phone] 4:21 PM

## 2022-07-28 NOTE — Patient Instructions (Signed)
Visit Information  Thank you for taking time to visit with me today. Please don't hesitate to contact me if I can be of assistance to you.   Following are the goals we discussed today:   Goals Addressed             This Visit's Progress    Obtain Stable and Safe Housing   On track    Activities and task to complete in order to accomplish goals.   LCSW coordinated with [place] LCSW to assist with patient care needs           Our next appointment is by telephone on 07/29/22   Please call the care guide team at [phone] if you need to cancel or reschedule your appointment.   If you are experiencing a Mental Health or [place] or need someone to talk to, please call the Suicide and Crisis Lifeline: 988 call 911   The patient verbalized understanding of instructions, educational materials, and care plan provided today and DECLINED offer to receive copy of patient instructions, educational materials, and care plan.   Christa See, MSW, [email or web address] Phone [phone] 4:22 PM

## 2022-07-28 NOTE — Assessment & Plan Note (Signed)
Alicia Vasquez is an 82 year old woman with history of stage IV breast cancer to the left femur continuing on Faslodex therapy every 4 weeks.  Alicia Vasquez will continue on the Faslodex injections given every 4 weeks.  She is tolerating these moderately well.  She has no clinical signs of breast cancer progression.  I increased her Coumadin to 6 mg daily.  We will need to see her back in a week for repeat INR.    I communicated with Alicia Vasquez and palliative care about the confusion over the oxycodone.  I let Alicia Vasquez know that I will defer to Alicia Vasquez and palliative care on her pain management.  Will see Alicia Vasquez back in 4 weeks for labs, follow-up, and her next injection.

## 2022-07-28 NOTE — Telephone Encounter (Signed)
Scheduled per 2/29 sch msg, pt has been called and confirmed

## 2022-07-29 ENCOUNTER — Ambulatory Visit: Payer: Self-pay

## 2022-07-29 ENCOUNTER — Telehealth: Payer: Self-pay | Admitting: Family Medicine

## 2022-07-29 ENCOUNTER — Ambulatory Visit: Payer: Self-pay | Admitting: Licensed Clinical Social Worker

## 2022-07-29 NOTE — Patient Instructions (Signed)
Visit Information  Thank you for taking time to visit with me today. Please don't hesitate to contact me if I can be of assistance to you.   Following are the goals we discussed today:   Goals Addressed             This Visit's Progress    Obtain Stable and Safe Housing   On track    Activities and task to complete in order to accomplish goals.   LCSW coordinated with [place] LCSW to assist with patient care needs Patient will obtain # to schedule transportation for upcoming appt Patient will continue utilizing alternative strategies with management of pain (heating pad) Patient will comply with med management as prescribed with providers             Please call the care guide team at [phone] if you need to cancel or reschedule your appointment.   If you are experiencing a Mental Health or [place] or need someone to talk to, please call the Suicide and Crisis Lifeline: 988 call 911   The patient verbalized understanding of instructions, educational materials, and care plan provided today and DECLINED offer to receive copy of patient instructions, educational materials, and care plan.   The patient will call LCSW* as advised to obtain transportation resource for upcoming appt.   Christa See, MSW, [email or web address] Phone [phone] 6:45 PM

## 2022-07-29 NOTE — Telephone Encounter (Signed)
Called and informed patient of message below. Patient states she is currently having transportation issues and is unable to go to PCP office or UC. Please Advise.

## 2022-07-29 NOTE — Telephone Encounter (Signed)
Patient need OV for evaluation  °

## 2022-07-29 NOTE — Telephone Encounter (Signed)
Caller states: -Calling from [facility] - Patient having burning w/urination and frequency  - Wants to know if meds can be called in since patient has transportation issues
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
## 2022-07-29 NOTE — Patient Outreach (Signed)
  Care Coordination   Follow Up Visit Note   07/29/2022 Name: Alicia Vasquez [facility] MRN: [id number] DOB: November 25, 1940  Alicia Vasquez is a 83 y.o. year old female who sees Jerline Pain, Algis Greenhouse, MD for primary care. I spoke with  Nelle Don Huebert by phone today.  What matters to the patients health and wellness today?  Assessed patient's needs, support system and barriers to care.     Goals Addressed             This Visit's Progress    Obtain Stable and Safe Housing   On track    Activities and task to complete in order to accomplish goals.   LCSW coordinated with [place] LCSW to assist with patient care needs Patient will obtain # to schedule transportation for upcoming appt Patient will continue utilizing alternative strategies with management of pain (heating pad) Patient will comply with med management as prescribed with providers              SDOH assessments and interventions completed:  No     Care Coordination Interventions:  Yes, provided  Interventions Today    Flowsheet Row Most Recent Value  Chronic Disease   Chronic disease during today's visit Hypertension (HTN), Other  [Depression]  General Interventions   General Interventions Discussed/Reviewed General Interventions Reviewed, Communication with  Doctor Visits Discussed/Reviewed Doctor Visits Discussed  Communication with RN  [Discussed concerns and patient care needs with APS. LCSW informed RNCM pt endorsed burining sensation while urinating]  Mental Health Interventions   Mental Health Discussed/Reviewed Coping Strategies, Mental Health Reviewed       Follow up plan: Follow up call scheduled for 1 week    Encounter Outcome:  Pt. Visit Completed   Christa See, MSW, [email or web address] Phone [phone] 6:44 PM
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
## 2022-08-01 ENCOUNTER — Encounter: Payer: Self-pay | Admitting: Licensed Clinical Social Worker

## 2022-08-01 ENCOUNTER — Telehealth: Payer: Self-pay | Admitting: *Deleted

## 2022-08-01 ENCOUNTER — Telehealth: Payer: Self-pay | Admitting: Pharmacist

## 2022-08-01 ENCOUNTER — Other Ambulatory Visit: Payer: Self-pay

## 2022-08-01 MED ORDER — NITROFURANTOIN MONOHYD MACRO 100 MG PO CAPS: 100.0000 mg | ORAL_CAPSULE | Freq: Two times a day (BID) | ORAL | 0 refills | Status: DC

## 2022-08-01 NOTE — Telephone Encounter (Signed)
Message forwarded from the switchboard late afternoon today stating " this is [name] with [facility] Adult Protective Services - Please call me at your next availability regarding this patient"  Return call number given as [phone].  This message was retrieved post 5 pm - follow up will be made tomorrow and will forward this note to LCC/NP who saw the patient at her last visit.

## 2022-08-01 NOTE — Telephone Encounter (Signed)
Pt unable to come to office or to have anyone drop off sample. Macrobid 100 mg BID x 7 days sent to [facility].

## 2022-08-01 NOTE — Telephone Encounter (Signed)
Strongly recommend she come in or have someone else at least bring in a urine specimen.  If unable to do this then ok to send in macrobid '100mg'$  bid x 7 days.  Alicia Vasquez. Jerline Pain, MD 08/01/2022 10:50 AM

## 2022-08-01 NOTE — Progress Notes (Unsigned)
Care Management & Coordination Services Pharmacy Team  Reason for Encounter: Medication coordination and delivery  Contacted patient to discuss medications and coordinate delivery from [facility]. {US HC Outreach:28874} Cycle dispensing form sent to *** for review.   Patient is due for next adherence delivery on: 08/11/2022  This delivery to include: Vials  30 Days  Lisinopril 10 mg daily Levothyroxine 125 mcg Zofran 8 mg every 8 hours as needed Warfarin 4 mg 1.5 tablet daily  Patient declined the following medications this month: ***  {refills needed:25320}  {Delivery BK:1911189   Any concerns about your medications? {yes/no:20286}  How often do you forget or accidentally miss a dose? {Missed doses:25554}  Do you use a pillbox? {yes/no:20286}  Is patient in packaging {yes/no:20286}  If yes  What is the date on your next pill pack?  Any concerns or issues with your packaging?   Recent blood pressure readings are as follows:***  Recent blood glucose readings are as follows:***   Chart review: Recent office visits:  None  Recent consult visits:  07/26/2022 OV (Oncology) Pickenpack-Cousar, Carlena Sax, NP; no medication changes indicated.  07/26/2022 OV (Oncology) Gardenia Phlegm, NP; I increased her Coumadin to 6 mg daily. We will need to see her back in a week for repeat INR.   Hospital visits:  None since last PharmD visit  Medications: Outpatient Encounter Medications as of 08/01/2022  Medication Sig   cholecalciferol (VITAMIN D3) 25 MCG (1000 UNIT) tablet Take 1,000 Units by mouth daily.   levothyroxine (SYNTHROID) 125 MCG tablet Take 1 tablet (125 mcg total) by mouth daily before breakfast.   lisinopril (PRINIVIL) 10 MG tablet Take 1 tablet (10 mg total) by mouth daily.   naloxone (NARCAN) 0.4 MG/ML injection Inject 1 mL (0.4 mg total) into the muscle as needed.   nitrofurantoin, macrocrystal-monohydrate, (MACROBID) 100 MG capsule Take 1  capsule (100 mg total) by mouth 2 (two) times daily.   ondansetron (ZOFRAN) 8 MG tablet Take 1 tablet (8 mg total) by mouth every 8 (eight) hours as needed for nausea or vomiting.   oxyCODONE (OXY IR/ROXICODONE) 5 MG immediate release tablet Take 1 tablet (5 mg total) by mouth every 6 (six) hours as needed for severe pain.   warfarin (COUMADIN) 4 MG tablet Take 1.5 tablets (6 mg total) by mouth daily at 4 PM.   No facility-administered encounter medications on file as of 08/01/2022.   BP Readings from Last 3 Encounters:  07/26/22 (!) 157/74  07/26/22 (!) 138/54  07/01/22 (!) 122/59    Pulse Readings from Last 3 Encounters:  07/26/22 90  07/26/22 97  07/01/22 88    No results found for: "HGBA1C" Lab Results  Component Value Date   CREATININE 0.80 07/26/2022   BUN 15 07/26/2022   GFR 59.13 (L) 12/05/2016   GFRNONAA >60 07/26/2022   GFRAA >60 08/07/2019   NA 140 07/26/2022   K 4.7 07/26/2022   CALCIUM 10.5 (H) 07/26/2022   CO2 30 07/26/2022     Future Appointments  Date Time Provider [place]  08/02/2022  1:00 PM [facility] LAB [facility] None  08/08/2022 10:30 AM [facility] PALLIATIVE CARE [facility] None  08/08/2022 12:00 PM Jon Billings, RN [facility] None  08/25/2022  1:15 PM [facility] LAB [facility] None  08/25/2022  1:45 PM [place], Arletha Pili, MD [facility] None  08/25/2022  2:15 PM [facility] [place] FLUSH [facility] None  08/29/2022  1:00 PM [facility] LAB [facility] None  08/29/2022  1:30 PM [facility] PALLIATIVE CARE [facility] None  08/31/2022  4:15 PM Marzetta Board, DPM [facility]  09/26/2022  1:00 PM [facility] HEALTH COACH [facility] PEC  10/25/2022  1:00 PM Philemon Kingdom, MD [facility] None  12/30/2022  2:00 PM [name], MD [facility]   April D Calhoun, [place] Pharmacist Assistant [phone]

## 2022-08-02 ENCOUNTER — Inpatient Hospital Stay: Payer: Medicare Other

## 2022-08-02 ENCOUNTER — Telehealth: Payer: Self-pay | Admitting: Licensed Clinical Social Worker

## 2022-08-02 NOTE — Patient Outreach (Signed)
  Care Coordination   Follow Up Collaboration Visit Note   08/02/2022 Name: Alicia Vasquez [facility] MRN: [id number] DOB: Oct 09, 1940  Alicia Vasquez is a 82 y.o. year old female who sees Jerline Pain, Algis Greenhouse, MD for primary care. I spoke with  Nelle Don Orf's DSS Worker by phone today.  What matters to the patients health and wellness today?  Transportation/Support    Goals Addressed             This Visit's Progress    Obtain Stable and Safe Housing   On track    Activities and task to complete in order to accomplish goals.   Patient and DSS worker will follow up on transportation resources LCSW will submit ROI request from [place] to PCP office, per their request for medical records from [facility] Patient will attend upcoming medical appts Patient will comply with meds prescribed by doctors              SDOH assessments and interventions completed:  No     Care Coordination Interventions:  Yes, provided  Interventions Today    Flowsheet Row Most Recent Value  Chronic Disease   Chronic disease during today's visit Hypertension (HTN), Other  [Depression]  General Interventions   General Interventions Discussed/Reviewed General Interventions Reviewed, Communication with  Communication with Social Work  [DSS Case Manager]       Follow up plan: Follow up call scheduled for 1 week    Encounter Outcome:  Pt. Visit Completed   Christa See, MSW, [email or web address] Phone [phone] 4:50 PM

## 2022-08-02 NOTE — Patient Instructions (Signed)
Visit Information  Thank you for taking time to visit with me today. Please don't hesitate to contact me if I can be of assistance to you.   Following are the goals we discussed today:   Goals Addressed             This Visit's Progress    Obtain Stable and Safe Housing   On track    Activities and task to complete in order to accomplish goals.   Patient and DSS worker will follow up on transportation resources LCSW will submit ROI request from [place] to PCP office, per their request for medical records from [facility] Patient will attend upcoming medical appts Patient will comply with meds prescribed by doctors              Please call the care guide team at [phone] if you need to cancel or reschedule your appointment.   If you are experiencing a Mental Health or [place] or need someone to talk to, please call the Suicide and Crisis Lifeline: 988 call 911   The patient verbalized understanding of instructions, educational materials, and care plan provided today and DECLINED offer to receive copy of patient instructions, educational materials, and care plan.   Christa See, MSW, [email or web address] Phone [phone] 4:51 PM

## 2022-08-03 ENCOUNTER — Telehealth: Payer: Self-pay | Admitting: Adult Health

## 2022-08-03 ENCOUNTER — Encounter: Payer: Self-pay | Admitting: Licensed Clinical Social Worker

## 2022-08-03 ENCOUNTER — Encounter: Payer: Self-pay | Admitting: Oncology

## 2022-08-03 NOTE — Telephone Encounter (Signed)
Returned Ms. Shaffer's (the Education officer, museum from Adult Protective Services following [facility]) call.  She wanted to make sure that nursing has noted the patient has transportation needs, as she was unable to come to an appointment due to lack of transportation.  I confirmed that we are aware, and that we require 2 days advance notice for transportation requests.  I also reviewed that we need [place] to answer the phone when we call so we can reschedule and work with her to provide transportation.  Ms. Elicia Lamp also asked for the phone number to obtain [facility] medical records, which I provided to her.    Ms. Elicia Lamp communicated that she would reach out to [facility] and advise her to reach out to our office to get her lab appointment rescheduled and so we could work on getting transportation arranged.    Wilber Bihari, NP 08/03/22 1:53 PM Medical Oncology and Hematology [facility] [place],  [address] Tel. [phone]    Fax. [phone]

## 2022-08-03 NOTE — Telephone Encounter (Signed)
Called to reschedule a recent lab appointment that the patient missed due to lack of transportation. Left voicemail for patient.

## 2022-08-04 ENCOUNTER — Encounter: Payer: Self-pay | Admitting: Licensed Clinical Social Worker

## 2022-08-04 DIAGNOSIS — G8929 Other chronic pain: Secondary | ICD-10-CM | POA: Diagnosis not present

## 2022-08-04 DIAGNOSIS — M9702XD Periprosthetic fracture around internal prosthetic left hip joint, subsequent encounter: Secondary | ICD-10-CM | POA: Diagnosis not present

## 2022-08-04 DIAGNOSIS — C50919 Malignant neoplasm of unspecified site of unspecified female breast: Secondary | ICD-10-CM | POA: Diagnosis not present

## 2022-08-04 DIAGNOSIS — C7951 Secondary malignant neoplasm of bone: Secondary | ICD-10-CM | POA: Diagnosis not present

## 2022-08-04 DIAGNOSIS — M84552D Pathological fracture in neoplastic disease, left femur, subsequent encounter for fracture with routine healing: Secondary | ICD-10-CM | POA: Diagnosis not present

## 2022-08-04 DIAGNOSIS — I10 Essential (primary) hypertension: Secondary | ICD-10-CM | POA: Diagnosis not present

## 2022-08-04 NOTE — Patient Outreach (Signed)
  Care Coordination   Multidisciplinary Case Review Note    08/04/2022 Name: Alicia Vasquez [facility] MRN: [id number] DOB: 1940/12/05  Alicia Vasquez is a 82 y.o. year old female who sees Jerline Pain, Algis Greenhouse, MD for primary care.  The  multidisciplinary care team met today to review patient care needs and barriers.     Goals Addressed   None     SDOH assessments and interventions completed:  No     Care Coordination Interventions Activated:  Yes   Care Coordination Interventions:  Yes, provided   Follow up plan: Follow up call scheduled for 1 week    Multidisciplinary Team Attendees:   [place]  Scribe for Multidisciplinary Case Review:  Christa See, MSW, [email or web address] Phone [phone]

## 2022-08-04 NOTE — Patient Outreach (Signed)
  Care Coordination   Follow Up/Collaborative Visit Note   08/04/2022 Name: Alicia Vasquez [facility] MRN: [id number] DOB: [birth year]  Alicia Vasquez is a 82 y.o. year old female who sees Jerline Pain, Algis Greenhouse, MD for primary care.   What matters to the patients health and wellness today?  Release of Information Request from [place] assessments and interventions completed:  No     Care Coordination Interventions:  Yes, provided   Follow up plan:  1 week    Encounter Outcome:  Pt. Visit Completed   Christa See, MSW, [email or web address] Phone [phone] 12:42 PM

## 2022-08-04 NOTE — Patient Instructions (Signed)
Visit Information  Thank you for taking time to visit with me today. Please don't hesitate to contact me if I can be of assistance to you.   Following are the goals we discussed today:   Goals Addressed             This Visit's Progress    Obtain Stable and Safe Housing   On track    Activities and task to complete in order to accomplish goals.   Patient and DSS worker will follow up on transportation resources LCSW will submit ROI request from [place] to PCP office, per their request for medical records from [facility] Patient will attend upcoming medical appts. Will contact [place] and/or [place] for transportation needs Patient will comply with meds prescribed by doctors              Please call the care guide team at [phone] if you need to cancel or reschedule your appointment.   If you are experiencing a Mental Health or [place] or need someone to talk to, please call the Suicide and Crisis Lifeline: 988 call 911   The patient verbalized understanding of instructions, educational materials, and care plan provided today and DECLINED offer to receive copy of patient instructions, educational materials, and care plan.   Christa See, MSW, [email or web address] Phone [phone] 11:16 AM

## 2022-08-04 NOTE — Patient Outreach (Signed)
  Care Coordination   Follow Up Visit Note   08/02/22 Name: Alicia Vasquez [facility] MRN: [id number] DOB: 1941/01/05  Alicia Vasquez is a 82 y.o. year old female who sees Jerline Pain, Algis Greenhouse, MD for primary care. I spoke with  Nelle Don Pelley by phone today.  What matters to the patients health and wellness today?  Transportation and Symptom Management    Goals Addressed             This Visit's Progress    Obtain Stable and Safe Housing   On track    Activities and task to complete in order to accomplish goals.   Patient and DSS worker will follow up on transportation resources LCSW will submit ROI request from [place] to PCP office, per their request for medical records from [facility] Patient will attend upcoming medical appts. Will contact [place] and/or [place] for transportation needs Patient will comply with meds prescribed by doctors              SDOH assessments and interventions completed:  No     Care Coordination Interventions:  Yes, provided  Interventions Today    Flowsheet Row Most Recent Value  Chronic Disease   Chronic disease during today's visit Hypertension (HTN), Other  [Depression]  General Interventions   General Interventions Discussed/Reviewed General Interventions Reviewed, [employer], Communication with  Communication with Social Work  [DSS APS Worker Patricia Nettle [phone] [place]  Education Interventions   Provided Verbal Education On [employer], [employer] informed pt that she does not have Medicaid. Discussed how to schedule transportation through [place] Discussed/Reviewed Anxiety, Coping Strategies  [Patient endorsed frustration with transportaion barriers. Validation and encouragement provided. LCSW discussed strategies to assist with support and management of symptoms]  Nutrition Interventions   Nutrition  Discussed/Reviewed Nutrition Reviewed  [LCSW informed pt that MOW have been approved for 6 months]  Safety Interventions   Safety Discussed/Reviewed Safety Reviewed  [LCSW discussed strategies to promote safety in home]       Follow up plan: Follow up call scheduled for 1 week    Encounter Outcome:  Pt. Visit Completed   Christa See, MSW, [email or web address] Phone [phone] 11:15 AM

## 2022-08-05 NOTE — Progress Notes (Unsigned)
[place]  [phone] Fax:([phone]   Name: Alicia Vasquez Date: 08/05/2022 MRN: [id number]  DOB: 1940-12-15  Patient Care Team: [name], MD as PCP - General (Family Medicine) Philemon Kingdom, MD as Consulting Physician (Internal Medicine) Caprice Renshaw, MD as Referring Physician (Internal Medicine) Wallene Huh, DPM as Consulting Physician (Podiatry) Bjorn Loser, MD as Consulting Physician (Urology) Benay Pike, MD as Consulting Physician (Hematology and Oncology) Paralee Cancel, MD as Consulting Physician (Orthopedic Surgery) Jon Billings, RN as [facility]   I connected with Alicia Vasquez on 08/05/22 at 10:30 AM EDT by phone and verified that I am speaking with the correct person using two identifiers.   I discussed the limitations, risks, security and privacy concerns of performing an evaluation and management service by telemedicine and the availability of in-person appointments. I also discussed with the patient that there may be a patient responsible charge related to this service. The patient expressed understanding and agreed to proceed.   Other persons participating in the visit and their role in the encounter: N/A   Patient's location: home  Provider's location: [facility]   Chief Complaint: follow up of symptom management   INTERVAL HISTORY: Alicia Vasquez is a 82 y.o. female with oncology medical history including right breast cancer ER positive s/p adjuvant radiation (2015), found to have metastatic disease August 2018 s/p left total hip replacement, multiple compression fractures, currently on fulvestrant therapy.  Palliative ask to see for symptom management and goals of care.   SOCIAL HISTORY:     reports that she has never smoked. She has never used smokeless tobacco. She reports that she does not drink alcohol and does not use drugs.  ADVANCE  DIRECTIVES:    CODE STATUS:   PAST MEDICAL HISTORY: Past Medical History:  Diagnosis Date   Allergy    Anxiety    Arthritis    Blood transfusion without reported diagnosis    Breast cancer ([place]) 07/12/2013   Invasive Mammary Carcinoma   DVT (deep vein thrombosis) in pregnancy    Hypertension    Hypothyroid    Metastatic cancer to bone ([place]) dx'd 12/2016   hip   Personal history of radiation therapy    Pneumonia    PONV (postoperative nausea and vomiting)    Radiation 11/21/13-01/07/14   Right Breast/Supraclavicular    ALLERGIES:  is allergic to anesthetics, amide; benadryl [diphenhydramine hcl]; carbocaine [mepivacaine hcl]; codeine; epinephrine; sulfa antibiotics; diphenhydramine; latex; penicillins; and tramadol.  MEDICATIONS:  Current Outpatient Medications  Medication Sig Dispense Refill   cholecalciferol (VITAMIN D3) 25 MCG (1000 UNIT) tablet Take 1,000 Units by mouth daily.     levothyroxine (SYNTHROID) 125 MCG tablet Take 1 tablet (125 mcg total) by mouth daily before breakfast. 90 tablet 0   lisinopril (PRINIVIL) 10 MG tablet Take 1 tablet (10 mg total) by mouth daily. 90 tablet 1   naloxone (NARCAN) 0.4 MG/ML injection Inject 1 mL (0.4 mg total) into the muscle as needed. 1 mL 0   nitrofurantoin, macrocrystal-monohydrate, (MACROBID) 100 MG capsule Take 1 capsule (100 mg total) by mouth 2 (two) times daily. 14 capsule 0   ondansetron (ZOFRAN) 8 MG tablet Take 1 tablet (8 mg total) by mouth every 8 (eight) hours as needed for nausea or vomiting. 90 tablet 0   oxyCODONE (OXY IR/ROXICODONE) 5 MG immediate release tablet Take 1 tablet (5 mg total) by mouth every 6 (six) hours as needed  for severe pain. 60 tablet 0   warfarin (COUMADIN) 4 MG tablet Take 1.5 tablets (6 mg total) by mouth daily at 4 PM. 60 tablet 2   No current facility-administered medications for this visit.    VITAL SIGNS: There were no vitals taken for this visit. There were no vitals filed for this  visit.  Estimated body mass index is 37.77 kg/m as calculated from the following:   Height as of 07/26/22: '5\' 4"'$  (1.626 m).   Weight as of 03/15/22: 220 lb 0.3 oz (99.8 kg).    PERFORMANCE STATUS (ECOG) : 2 - Symptomatic, <50% confined to bed  Impression  I connected by phone with Alicia Vasquez. No acute distress noted. Reports her pain is somewhat improved since our last visit. She is supposed to be taking a medication for possible UTI as prescribed by her PCP however she feels that is the not the right medication for her. Advised to contact their office and further discuss.   [name] becomes tearful. When I inquire about her tearfulness she shares recently a Education officer, museum from [employer] came to her motel room to check-in on her with what appeared to be some form of Event organiser. Patient stated Social Worker mentioned her living arrangements and wanting to move her to a different location. Alicia Vasquez is tearful expressing she cannot leave her son. Emotional support provided. She reports they plan to follow-up with patient this week.   Neoplasm related pain Alicia Vasquez states her pain is controlled on current regimen. She has received her medication through pharmacy delivery. Taking as prescribed.   We reviewed regimen. She is taking oxycodone '5mg'$  every 6 hours as needed.    Constipation Controlled with stool softeners daily.   I discussed the importance of continued conversation with family and their medical providers regarding overall plan of care and treatment options, ensuring decisions are within the context of the patients values and GOCs.  PLAN:  Oxycodone 5 mg every 6 hours as needed.  Zofran 8 mg every 8 hours as needed for nausea.  Patient states she generally has nausea at least 2-3 days post Faslodex injection Colace twice daily Ongoing symptom management and support as needed I will plan to see patient back in 3-4 weeks in collaboration with other oncology appointments.     Patient expressed understanding and was in agreement with this plan. She also understands that She can call the clinic at any time with any questions, concerns, or complaints.    Any controlled substances utilized were prescribed in the context of palliative care. PDMP has been reviewed.  Time Total: 45 min   Visit consisted of counseling and education dealing with the complex and emotionally intense issues of symptom management and palliative care in the setting of serious and potentially life-threatening illness.Greater than 50%  of this time was spent counseling and coordinating care related to the above assessment and plan.  Alda Lea, AGPCNP-BC  Palliative Medicine Team/[place]

## 2022-08-08 ENCOUNTER — Ambulatory Visit: Payer: Self-pay

## 2022-08-08 ENCOUNTER — Encounter: Payer: Self-pay | Admitting: Nurse Practitioner

## 2022-08-08 ENCOUNTER — Telehealth: Payer: Self-pay

## 2022-08-08 ENCOUNTER — Inpatient Hospital Stay: Payer: Medicare Other | Attending: Oncology | Admitting: Nurse Practitioner

## 2022-08-08 DIAGNOSIS — G893 Neoplasm related pain (acute) (chronic): Secondary | ICD-10-CM

## 2022-08-08 DIAGNOSIS — Z170 Estrogen receptor positive status [ER+]: Secondary | ICD-10-CM | POA: Insufficient documentation

## 2022-08-08 DIAGNOSIS — F419 Anxiety disorder, unspecified: Secondary | ICD-10-CM

## 2022-08-08 DIAGNOSIS — Z515 Encounter for palliative care: Secondary | ICD-10-CM

## 2022-08-08 DIAGNOSIS — C50411 Malignant neoplasm of upper-outer quadrant of right female breast: Secondary | ICD-10-CM | POA: Insufficient documentation

## 2022-08-08 DIAGNOSIS — M84552D Pathological fracture in neoplastic disease, left femur, subsequent encounter for fracture with routine healing: Secondary | ICD-10-CM | POA: Diagnosis not present

## 2022-08-08 DIAGNOSIS — C7951 Secondary malignant neoplasm of bone: Secondary | ICD-10-CM | POA: Insufficient documentation

## 2022-08-08 DIAGNOSIS — C50919 Malignant neoplasm of unspecified site of unspecified female breast: Secondary | ICD-10-CM | POA: Diagnosis not present

## 2022-08-08 DIAGNOSIS — Z79891 Long term (current) use of opiate analgesic: Secondary | ICD-10-CM | POA: Insufficient documentation

## 2022-08-08 DIAGNOSIS — Z5111 Encounter for antineoplastic chemotherapy: Secondary | ICD-10-CM | POA: Insufficient documentation

## 2022-08-08 DIAGNOSIS — I10 Essential (primary) hypertension: Secondary | ICD-10-CM | POA: Diagnosis not present

## 2022-08-08 DIAGNOSIS — G8929 Other chronic pain: Secondary | ICD-10-CM | POA: Diagnosis not present

## 2022-08-08 DIAGNOSIS — M9702XD Periprosthetic fracture around internal prosthetic left hip joint, subsequent encounter: Secondary | ICD-10-CM | POA: Diagnosis not present

## 2022-08-08 DIAGNOSIS — M8580 Other specified disorders of bone density and structure, unspecified site: Secondary | ICD-10-CM | POA: Insufficient documentation
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
## 2022-08-08 NOTE — Patient Outreach (Signed)
  Care Coordination   08/08/2022 Name: Alicia Vasquez [facility] MRN: [id number] DOB: 09/06/1940   Care Coordination Outreach Attempts:  An unsuccessful telephone outreach was attempted today to offer the patient information about available care coordination services as a benefit of their health plan.   Follow Up Plan:  Additional outreach attempts will be made to offer the patient care coordination information and services.   Encounter Outcome:  No Answer    Care Coordination Interventions:  No, not indicated     Jone Baseman, RN, MSN [place] Management Care Management Coordinator Direct Line [phone]
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
## 2022-08-09 ENCOUNTER — Telehealth: Payer: Self-pay

## 2022-08-09 ENCOUNTER — Telehealth: Payer: Self-pay | Admitting: Licensed Clinical Social Worker

## 2022-08-09 NOTE — Patient Instructions (Signed)
Visit Information  Thank you for taking time to visit with me today. Please don't hesitate to contact me if I can be of assistance to you.   Following are the goals we discussed today:   Goals Addressed             This Visit's Progress    Obtain Stable and Safe Housing   On track    Activities and task to complete in order to accomplish goals.   Patient and DSS worker will follow up on transportation resources Patient will contact [place] at [phone] to inquire about delivery dates Patient will attend upcoming medical appts. Will contact [place] and/or [place] for transportation needs Patient will comply with meds prescribed by doctors             Please call the care guide team at [phone] if you need to cancel or reschedule your appointment.   If you are experiencing a Mental Health or [place] or need someone to talk to, please call the Suicide and Crisis Lifeline: 988 call 911   The patient verbalized understanding of instructions, educational materials, and care plan provided today and DECLINED offer to receive copy of patient instructions, educational materials, and care plan.   Christa See, MSW, [email or web address] Phone [phone] 10:30 AM

## 2022-08-09 NOTE — Patient Outreach (Addendum)
  Care Coordination   Follow Up Visit Note   08/09/2022 Name: Alicia Vasquez [facility] MRN: [id number] DOB: [birth year]  Alicia Vasquez is a 82 y.o. year old female who sees Jerline Pain, Algis Greenhouse, MD for primary care. I spoke with  Nelle Don Weninger by phone today.  What matters to the patients health and wellness today?   -Tooth cavity.  Patient reports raw area/thrush.  Patient reports some contraindication.  Message to [place] at the cancer center for advice. [name] states no contraindication except for coumadin.   Discussed with patient needing a dentist appointment.  Patient states her dentist is in [place] and she will call.    -Message to Dr. Marigene Ehlers office for something different for UTI as macrobid makes patient sick.  Patient continues to have burning on urination.   -Patient upset she states that DSS worker called her and states she will be making a visit and would call before she comes.  Advised patient that DSS is only wanting to make sure she is in a safe good environment and being taken care of. She verbalized understanding but tearful on the phone.      Goals Addressed             This Visit's Progress    Pain control/ getting back into her home       Patient Goals/Self Care Activities: -Patient/Caregiver will self-administer medications as prescribed as evidenced by self-report/primary caregiver report  -Patient/Caregiver will attend all scheduled provider appointments as evidenced by clinician review of documented attendance to scheduled appointments and patient/caregiver report -Patient/Caregiver will call provider office for new concerns or questions as evidenced by review of documented incoming telephone call notes and patient report  Interventions Today    Flowsheet Row Most Recent Value  Chronic Disease   Chronic disease during today's visit Other  [Cavity to tooth on right side causing pain/raw feeling to her mouth.]  General Interventions   General Interventions  Discussed/Reviewed General Interventions Reviewed, Communication with  Communication with PCP/Specialists  [facility] to [facility] at the cancer center regarding options as patient states there is some contraindication to dental work with her cancer treatments.]  Education Interventions   Education Provided Provided Education  Provided Verbal Education On When to see the doctor             SDOH assessments and interventions completed:  No     Care Coordination Interventions:  Yes, provided   Follow up plan: Follow up call scheduled for March 27th    Encounter Outcome:  Pt. Visit Completed   Jone Baseman, RN, MSN [place] Management Care Management Coordinator Direct Line [phone]

## 2022-08-09 NOTE — Patient Outreach (Signed)
  Care Coordination   Follow Up Visit Note   08/09/2022 Name: Alicia Vasquez [facility] MRN: [id number] DOB: 1940/07/28  Bradlee Bridgers Alicia Vasquez is a 82 y.o. year old female who sees Alicia Vasquez, Alicia Greenhouse, MD for primary care. I spoke with  [name] by phone today.  What matters to the patients health and wellness today?  Magazine features editor   Goals Addressed             This Visit's Progress    Obtain Stable and Safe Housing   On track    Activities and task to complete in order to accomplish goals.   Patient and DSS worker will follow up on transportation resources Patient will contact [place] at [phone] to inquire about delivery dates Patient will attend upcoming medical appts. Will contact [place] and/or [place] for transportation needs Patient will comply with meds prescribed by doctors              SDOH assessments and interventions completed:  No     Care Coordination Interventions:  Yes, provided   Follow up plan: Follow up call scheduled for 1-2 weeks    Encounter Outcome:  Pt. Visit Completed   Christa See, MSW, [email or web address] Phone [phone] 10:29 AM

## 2022-08-09 NOTE — Patient Instructions (Signed)
Visit Information  Thank you for taking time to visit with me today. Please don't hesitate to contact me if I can be of assistance to you.   Following are the goals we discussed today:   Goals Addressed             This Visit's Progress    Pain control/ getting back into her home       Patient Goals/Self Care Activities: -Patient/Caregiver will self-administer medications as prescribed as evidenced by self-report/primary caregiver report  -Patient/Caregiver will attend all scheduled provider appointments as evidenced by clinician review of documented attendance to scheduled appointments and patient/caregiver report -Patient/Caregiver will call provider office for new concerns or questions as evidenced by review of documented incoming telephone call notes and patient report  Interventions Today    Flowsheet Row Most Recent Value  Chronic Disease   Chronic disease during today's visit Other  [Cavity to tooth on right side causing pain/raw feeling to her mouth.]  General Interventions   General Interventions Discussed/Reviewed General Interventions Reviewed, Communication with  Communication with PCP/Specialists  [facility] sent to [facility] at the cancer center regarding options as patient states there is some contraindication to dental work with her cancer treatments.]  Education Interventions   Education Provided Provided Education  Provided Verbal Education On When to see the doctor             Our next appointment is by telephone on 08/24/22 at 1000 am  Please call the care guide team at [phone] if you need to cancel or reschedule your appointment.   If you are experiencing a Mental Health or [place] or need someone to talk to, please call the Suicide and Crisis Lifeline: 988   The patient verbalized understanding of instructions, educational materials, and care plan provided today and DECLINED offer to receive copy of patient instructions, educational  materials, and care plan.   The patient has been provided with contact information for the care management team and has been advised to call with any health related questions or concerns.   Jone Baseman, RN, MSN [place] Management Care Management Coordinator Direct Line [phone]

## 2022-08-10 ENCOUNTER — Telehealth: Payer: Self-pay

## 2022-08-10 NOTE — Patient Outreach (Signed)
  Care Coordination   Follow Up Visit Note   08/10/2022 Name: Alicia Vasquez [facility] MRN: [id number] DOB: 03-Nov-1940  Alicia Vasquez is a 82 y.o. year old female who sees Jerline Pain, Algis Greenhouse, MD for primary care. I spoke with  Alicia Vasquez by phone today.  What matters to the patients health and wellness today?  Getting dental work done. Patient given resources on dentist to call for tooth with cavity.      Goals Addressed             This Visit's Progress    Pain control/ getting back into her home       Patient Goals/Self Care Activities: -Patient/Caregiver will self-administer medications as prescribed as evidenced by self-report/primary caregiver report  -Patient/Caregiver will attend all scheduled provider appointments as evidenced by clinician review of documented attendance to scheduled appointments and patient/caregiver report -Patient/Caregiver will call provider office for new concerns or questions as evidenced by review of documented incoming telephone call notes and patient report  Interventions Today    Flowsheet Row Most Recent Value  Education Interventions   Education Provided Provided Education  Provided Verbal Education On Other  [Patient given oral surgeon information.  Patient also advised on MOMs meals delivery 3/15 to 3/18.  Patient verbalized understanding.]             SDOH assessments and interventions completed:  No     Care Coordination Interventions:  Yes, provided   Follow up plan: Follow up call scheduled for 08/24/22    Encounter Outcome:  Pt. Visit Completed   Jone Baseman, RN, MSN [place] Management Care Management Coordinator Direct Line [phone]

## 2022-08-10 NOTE — Patient Instructions (Signed)
Visit Information  Thank you for taking time to visit with me today. Please don't hesitate to contact me if I can be of assistance to you.   Following are the goals we discussed today:   Goals Addressed             This Visit's Progress    Pain control/ getting back into her home       Patient Goals/Self Care Activities: -Patient/Caregiver will self-administer medications as prescribed as evidenced by self-report/primary caregiver report  -Patient/Caregiver will attend all scheduled provider appointments as evidenced by clinician review of documented attendance to scheduled appointments and patient/caregiver report -Patient/Caregiver will call provider office for new concerns or questions as evidenced by review of documented incoming telephone call notes and patient report  Interventions Today    Flowsheet Row Most Recent Value  Education Interventions   Education Provided Provided Education  Provided Verbal Education On Other  [Patient given oral surgeon information.  Patient also advised on MOMs meals delivery 3/15 to 3/18.  Patient verbalized understanding.]             Our next appointment is by telephone on 08/24/22 at 1000  Please call the care guide team at [phone] if you need to cancel or reschedule your appointment.   If you are experiencing a Mental Health or [place] or need someone to talk to, please call the Suicide and Crisis Lifeline: 988   The patient verbalized understanding of instructions, educational materials, and care plan provided today and DECLINED offer to receive copy of patient instructions, educational materials, and care plan.   The patient has been provided with contact information for the care management team and has been advised to call with any health related questions or concerns.   Jone Baseman, RN, MSN [place] Management Care Management Coordinator Direct Line [phone]

## 2022-08-11 ENCOUNTER — Encounter: Payer: Self-pay | Admitting: Licensed Clinical Social Worker

## 2022-08-11 DIAGNOSIS — I10 Essential (primary) hypertension: Secondary | ICD-10-CM | POA: Diagnosis not present

## 2022-08-11 DIAGNOSIS — C50919 Malignant neoplasm of unspecified site of unspecified female breast: Secondary | ICD-10-CM | POA: Diagnosis not present

## 2022-08-11 DIAGNOSIS — C7951 Secondary malignant neoplasm of bone: Secondary | ICD-10-CM | POA: Diagnosis not present

## 2022-08-11 DIAGNOSIS — G8929 Other chronic pain: Secondary | ICD-10-CM | POA: Diagnosis not present

## 2022-08-11 DIAGNOSIS — M84552D Pathological fracture in neoplastic disease, left femur, subsequent encounter for fracture with routine healing: Secondary | ICD-10-CM | POA: Diagnosis not present

## 2022-08-11 DIAGNOSIS — M9702XD Periprosthetic fracture around internal prosthetic left hip joint, subsequent encounter: Secondary | ICD-10-CM | POA: Diagnosis not present

## 2022-08-11 NOTE — Patient Outreach (Signed)
  Care Coordination   Multidisciplinary Case Review Note    08/11/2022 Name: Alicia Vasquez [facility] MRN: [id number] DOB: February 28, 1941  Alicia Vasquez is a 82 y.o. year old female who sees Jerline Pain, Algis Greenhouse, MD for primary care.  The  multidisciplinary care team met today to review patient care needs and barriers.    Patient was not engaged during this encounter. LCSW will continue to collaborate with [facility] and [place] Worker to assist with patient care  SDOH assessments and interventions completed:  No     Care Coordination Interventions Activated:  Yes  Interventions Today    Flowsheet Row Most Recent Value  Chronic Disease   Chronic disease during today's visit Hypertension (HTN)  General Interventions   General Interventions Discussed/Reviewed Communication with, International aid/development worker collaborated with [employer] LCSW to obtain Civil Service fast streamer. An email requesting assistance scheduling transportation to Cancer center submitted to [place] with RN, Social Work       Care Coordination Interventions:  Yes, provided   Follow up plan: Follow up call scheduled for 1 week    Multidisciplinary Team Attendees:   Alicia Vasquez Alicia Vasquez Alicia Vasquez  Scribe for Multidisciplinary Case Review:  Christa See, MSW, [email or web address] Phone [phone]

## 2022-08-11 NOTE — Telephone Encounter (Signed)
Tried to call pt to schedule ov. Unable to leave msg.

## 2022-08-16 DIAGNOSIS — M84552D Pathological fracture in neoplastic disease, left femur, subsequent encounter for fracture with routine healing: Secondary | ICD-10-CM | POA: Diagnosis not present

## 2022-08-16 DIAGNOSIS — G8929 Other chronic pain: Secondary | ICD-10-CM | POA: Diagnosis not present

## 2022-08-16 DIAGNOSIS — I10 Essential (primary) hypertension: Secondary | ICD-10-CM | POA: Diagnosis not present

## 2022-08-16 DIAGNOSIS — M9702XD Periprosthetic fracture around internal prosthetic left hip joint, subsequent encounter: Secondary | ICD-10-CM | POA: Diagnosis not present

## 2022-08-16 DIAGNOSIS — C7951 Secondary malignant neoplasm of bone: Secondary | ICD-10-CM | POA: Diagnosis not present

## 2022-08-16 DIAGNOSIS — C50919 Malignant neoplasm of unspecified site of unspecified female breast: Secondary | ICD-10-CM | POA: Diagnosis not present

## 2022-08-17 ENCOUNTER — Encounter: Payer: Self-pay | Admitting: Oncology

## 2022-08-17 ENCOUNTER — Telehealth: Payer: Self-pay | Admitting: Family Medicine

## 2022-08-17 NOTE — Telephone Encounter (Signed)
Home Health Certification or Plan of Care Tracking  Is this a Certification or Plan of Care? Yes    HH Agency:[facility]   Order Number:  [id number]  Has charge sheet been attached? Yes   Where has form been placed:  providers box   Faxed to:   [phone]

## 2022-08-18 NOTE — Telephone Encounter (Signed)
Form placed on PCP desk for reviewed

## 2022-08-18 NOTE — Telephone Encounter (Signed)
Form Faxed to [phone]

## 2022-08-22 ENCOUNTER — Telehealth: Payer: Self-pay

## 2022-08-22 DIAGNOSIS — C50919 Malignant neoplasm of unspecified site of unspecified female breast: Secondary | ICD-10-CM | POA: Diagnosis not present

## 2022-08-22 DIAGNOSIS — G8929 Other chronic pain: Secondary | ICD-10-CM | POA: Diagnosis not present

## 2022-08-22 DIAGNOSIS — I10 Essential (primary) hypertension: Secondary | ICD-10-CM | POA: Diagnosis not present

## 2022-08-22 DIAGNOSIS — M9702XD Periprosthetic fracture around internal prosthetic left hip joint, subsequent encounter: Secondary | ICD-10-CM | POA: Diagnosis not present

## 2022-08-22 DIAGNOSIS — C7951 Secondary malignant neoplasm of bone: Secondary | ICD-10-CM | POA: Diagnosis not present

## 2022-08-22 DIAGNOSIS — M84552D Pathological fracture in neoplastic disease, left femur, subsequent encounter for fracture with routine healing: Secondary | ICD-10-CM | POA: Diagnosis not present

## 2022-08-22 NOTE — Telephone Encounter (Signed)
   Telephone encounter was:  Unsuccessful.  08/22/2022 Name: Alicia Vasquez MRN: [id number] DOB: [birth year]  Unsuccessful outbound call made today to assist with:  Transportation Needs   Outreach Attempt:  1st Attempt  A HIPAA compliant voice message was left requesting a return call.  Instructed patient to call back at [phone]. Left message for patient to return my call regarding transportation at [phone], no answer at [phone] or [phone]. Spoke with [employer], patients application is approved through April 2025. Reservation line [phone]  [name]  [facility] Population [facility] Community Resource Care Guide   [email or web address]  ?? [id number]   Website: [email or web address]  [email or web address]

## 2022-08-23 ENCOUNTER — Telehealth: Payer: Self-pay

## 2022-08-23 ENCOUNTER — Other Ambulatory Visit: Payer: Self-pay

## 2022-08-23 DIAGNOSIS — M84459A Pathological fracture, hip, unspecified, initial encounter for fracture: Secondary | ICD-10-CM

## 2022-08-23 DIAGNOSIS — G893 Neoplasm related pain (acute) (chronic): Secondary | ICD-10-CM

## 2022-08-23 DIAGNOSIS — Z515 Encounter for palliative care: Secondary | ICD-10-CM

## 2022-08-23 DIAGNOSIS — C7951 Secondary malignant neoplasm of bone: Secondary | ICD-10-CM

## 2022-08-23 MED ORDER — OXYCODONE HCL 5 MG PO TABS: 5.0000 mg | ORAL_TABLET | Freq: Four times a day (QID) | ORAL | 0 refills | Status: DC | PRN

## 2022-08-23 NOTE — Telephone Encounter (Signed)
Pt called for med refill, see new order

## 2022-08-23 NOTE — Telephone Encounter (Signed)
   Telephone encounter was:  Successful.  08/23/2022 Name: Alicia Vasquez MRN: [id number] DOB: 04/28/1941  Alicia Vasquez is a 82 y.o. year old female who is a primary care patient of [name], MD . The community resource team was consulted for assistance with Transportation Needs   Care guide performed the following interventions: Spoke with patient and [facility] at [place] transportation to arrange rides for 3/28 and 4/1. Transportation for 4/3 cannot be scheduled until tomorrow. Pickup times for 3/2 and 4/1, 11:45am-12:15pm pickup time for both days 2:30-3:30pm. [facility] line number is [phone], fee is 2.50 each way. Patient has the reservation number and the cost. Patient will not need to re-certify until April [id number].    Follow Up Plan:  Care guide will follow up with patient by phone over the next 7 business days.  [place] Resource Care Guide   [email or web address]  ?? [id number]   Website: [email or web address]  [email or web address]

## 2022-08-24 ENCOUNTER — Telehealth: Payer: Self-pay

## 2022-08-24 ENCOUNTER — Ambulatory Visit: Payer: Self-pay

## 2022-08-24 ENCOUNTER — Telehealth: Payer: Self-pay | Admitting: Licensed Clinical Social Worker

## 2022-08-24 NOTE — Telephone Encounter (Signed)
   Telephone encounter was:  Successful.  08/24/2022 Name: Alicia Vasquez MRN: [id number] DOB: Jun 08, 1940  Alicia Vasquez is a 82 y.o. year old female who is a primary care patient of [name], MD . The community resource team was consulted for assistance with Transportation Needs   Care guide performed the following interventions: Spoke with [facility] at [place] transportation is scheduled for 08/31/22. Pickup time from home is 2:50pm-3:20pm, pickup time after appointment is 4:30pm-5:00pm. Spoke with patient to giver her details also, verified her email address per her request sent transportation information.  Follow Up Plan:  No further follow up planned at this time. The patient has been provided with needed resources.  [place] Resource Care Guide   [email or web address]  ?? [id number]   Website: [email or web address]  [email or web address]

## 2022-08-24 NOTE — Telephone Encounter (Signed)
Pt Called and confirmed with RN that refill was sent in yesterday, no further questions.

## 2022-08-24 NOTE — Patient Outreach (Signed)
  Care Coordination   Follow Up Visit Note   08/24/2022 Name: Akia Streit [facility] MRN: [id number] DOB: [birth year]  Meeah Parquette Hottle is a 82 y.o. year old female who sees Jerline Pain, Algis Greenhouse, MD for primary care. I spoke with  Nelle Don Yount by phone today.  What matters to the patients health and wellness today?  Finding somewhere to stay/pending court case    Goals Addressed             This Visit's Progress    Pain control/ getting back into her home       Patient Goals/Self Care Activities: -Patient/Caregiver will self-administer medications as prescribed as evidenced by self-report/primary caregiver report  -Patient/Caregiver will attend all scheduled provider appointments as evidenced by clinician review of documented attendance to scheduled appointments and patient/caregiver report -Patient/Caregiver will call provider office for new concerns or questions as evidenced by review of documented incoming telephone call notes and patient report  Patient reports that account has been frozen through DSS. Patient trying to figure things out. Patient has court date of 08/30/22.  Patient has called dentist for cavity. Patient to follow out.  Interventions Today    Flowsheet Row Most Recent Value  Chronic Disease   Chronic disease during today's visit Hypertension (HTN), Other  [Pain control]  General Interventions   General Interventions Discussed/Reviewed General Interventions Reviewed, Communication with  Doctor Visits Discussed/Reviewed Doctor Visits Reviewed  Communication with PCP/Specialists, Social Work  [facility] to Palliative care concerning medications-refill sent. Discussed with Christa See current information on patient frozen financial account.]  Education Interventions   Education Provided Provided Education  Provided Verbal Education On Other  [pain control]  Mental Health Interventions   Mental Health Discussed/Reviewed Depression, Mental Health Reviewed  Pharmacy  Interventions   Pharmacy Dicussed/Reviewed Pharmacy Topics Discussed, Medications and their functions             SDOH assessments and interventions completed:  Yes     Care Coordination Interventions:  Yes, provided   Follow up plan: Follow up call scheduled for April    Encounter Outcome:  Pt. Visit Completed   Jone Baseman, RN, MSN [place] Management Care Management Coordinator Direct Line [phone]

## 2022-08-24 NOTE — Patient Instructions (Signed)
Visit Information  Thank you for taking time to visit with me today. Please don't hesitate to contact me if I can be of assistance to you.   Following are the goals we discussed today:   Goals Addressed             This Visit's Progress    Pain control/ getting back into her home       Patient Goals/Self Care Activities: -Patient/Caregiver will self-administer medications as prescribed as evidenced by self-report/primary caregiver report  -Patient/Caregiver will attend all scheduled provider appointments as evidenced by clinician review of documented attendance to scheduled appointments and patient/caregiver report -Patient/Caregiver will call provider office for new concerns or questions as evidenced by review of documented incoming telephone call notes and patient report  Patient reports that account has been frozen through DSS. Patient trying to figure things out. Patient has court date of 08/30/22.  Patient has called dentist for cavity. Patient to follow out.  Interventions Today    Flowsheet Row Most Recent Value  Chronic Disease   Chronic disease during today's visit Hypertension (HTN), Other  [Pain control]  General Interventions   General Interventions Discussed/Reviewed General Interventions Reviewed  Doctor Visits Discussed/Reviewed Doctor Visits Reviewed  Education Interventions   Education Provided Provided Education  Provided Verbal Education On Other  [pain control]  Mental Health Interventions   Mental Health Discussed/Reviewed Depression, Mental Health Reviewed  Pharmacy Interventions   Pharmacy Dicussed/Reviewed Pharmacy Topics Discussed, Medications and their functions             Our next appointment is by telephone on 08/31/22 at 1000  Please call the care guide team at [phone] if you need to cancel or reschedule your appointment.   If you are experiencing a Mental Health or [place] or need someone to talk to, please call the  Suicide and Crisis Lifeline: 988   The patient verbalized understanding of instructions, educational materials, and care plan provided today and DECLINED offer to receive copy of patient instructions, educational materials, and care plan.   The patient has been provided with contact information for the care management team and has been advised to call with any health related questions or concerns.   Jone Baseman, RN, MSN [place] Management Care Management Coordinator Direct Line [phone]

## 2022-08-25 ENCOUNTER — Inpatient Hospital Stay: Payer: Medicare Other

## 2022-08-25 ENCOUNTER — Inpatient Hospital Stay (HOSPITAL_BASED_OUTPATIENT_CLINIC_OR_DEPARTMENT_OTHER): Payer: Medicare Other | Admitting: Hematology and Oncology

## 2022-08-25 ENCOUNTER — Other Ambulatory Visit: Payer: Self-pay

## 2022-08-25 ENCOUNTER — Encounter: Payer: Self-pay | Admitting: Licensed Clinical Social Worker

## 2022-08-25 VITALS — BP 151/96 | HR 88 | Temp 97.7°F | Resp 18 | Ht 64.0 in

## 2022-08-25 DIAGNOSIS — Z5111 Encounter for antineoplastic chemotherapy: Secondary | ICD-10-CM | POA: Diagnosis not present

## 2022-08-25 DIAGNOSIS — C7951 Secondary malignant neoplasm of bone: Secondary | ICD-10-CM | POA: Diagnosis not present

## 2022-08-25 DIAGNOSIS — Z170 Estrogen receptor positive status [ER+]: Secondary | ICD-10-CM | POA: Diagnosis not present

## 2022-08-25 DIAGNOSIS — C50411 Malignant neoplasm of upper-outer quadrant of right female breast: Secondary | ICD-10-CM | POA: Diagnosis not present

## 2022-08-25 DIAGNOSIS — M899 Disorder of bone, unspecified: Secondary | ICD-10-CM

## 2022-08-25 DIAGNOSIS — Z515 Encounter for palliative care: Secondary | ICD-10-CM

## 2022-08-25 DIAGNOSIS — M84659P Pathological fracture in other disease, hip, unspecified, subsequent encounter for fracture with malunion: Secondary | ICD-10-CM

## 2022-08-25 DIAGNOSIS — G893 Neoplasm related pain (acute) (chronic): Secondary | ICD-10-CM

## 2022-08-25 DIAGNOSIS — M8580 Other specified disorders of bone density and structure, unspecified site: Secondary | ICD-10-CM | POA: Diagnosis not present

## 2022-08-25 DIAGNOSIS — Z79891 Long term (current) use of opiate analgesic: Secondary | ICD-10-CM | POA: Diagnosis not present

## 2022-08-25 LAB — CMP (CANCER CENTER ONLY)
ALT: 13 U/L (ref 0–44)
AST: 18 U/L (ref 15–41)
Albumin: 4 g/dL (ref 3.5–5.0)
Alkaline Phosphatase: 159 U/L — ABNORMAL HIGH (ref 38–126)
Anion gap: 6 (ref 5–15)
BUN: 16 mg/dL (ref 8–23)
CO2: 29 mmol/L (ref 22–32)
Calcium: 11.2 mg/dL — ABNORMAL HIGH (ref 8.9–10.3)
Chloride: 105 mmol/L (ref 98–111)
Creatinine: 1.09 mg/dL — ABNORMAL HIGH (ref 0.44–1.00)
GFR, Estimated: 50 mL/min — ABNORMAL LOW (ref >60)
Glucose, Bld: 113 mg/dL — ABNORMAL HIGH (ref 70–99)
Potassium: 4.4 mmol/L (ref 3.5–5.1)
Sodium: 140 mmol/L (ref 135–145)
Total Bilirubin: 0.5 mg/dL (ref 0.3–1.2)
Total Protein: 7.1 g/dL (ref 6.5–8.1)

## 2022-08-25 LAB — CBC WITH DIFFERENTIAL (CANCER CENTER ONLY)
Abs Immature Granulocytes: 0.02 10*3/uL (ref 0.00–0.07)
Basophils Absolute: 0.1 10*3/uL (ref 0.0–0.1)
Basophils Relative: 1 %
Eosinophils Absolute: 0.5 10*3/uL (ref 0.0–0.5)
Eosinophils Relative: 6 %
HCT: 40.5 % (ref 36.0–46.0)
Hemoglobin: 12.8 g/dL (ref 12.0–15.0)
Immature Granulocytes: 0 %
Lymphocytes Relative: 20 %
Lymphs Abs: 1.6 10*3/uL (ref 0.7–4.0)
MCH: 28.4 pg (ref 26.0–34.0)
MCHC: 31.6 g/dL (ref 30.0–36.0)
MCV: 89.8 fL (ref 80.0–100.0)
Monocytes Absolute: 1.2 10*3/uL — ABNORMAL HIGH (ref 0.1–1.0)
Monocytes Relative: 14 %
Neutro Abs: 4.7 10*3/uL (ref 1.7–7.7)
Neutrophils Relative %: 59 %
Platelet Count: 249 10*3/uL (ref 150–400)
RBC: 4.51 MIL/uL (ref 3.87–5.11)
RDW: 15 % (ref 11.5–15.5)
WBC Count: 8 10*3/uL (ref 4.0–10.5)
nRBC: 0 % (ref 0.0–0.2)

## 2022-08-25 LAB — RAPID URINE DRUG SCREEN, HOSP PERFORMED
Amphetamines: NOT DETECTED
Barbiturates: NOT DETECTED
Benzodiazepines: NOT DETECTED
Cocaine: NOT DETECTED
Opiates: NOT DETECTED
Tetrahydrocannabinol: NOT DETECTED

## 2022-08-25 MED ORDER — FULVESTRANT 250 MG/5ML IM SOSY
500.0000 mg | PREFILLED_SYRINGE | Freq: Once | INTRAMUSCULAR | Status: AC
  Administered 2022-08-25: 500 mg via INTRAMUSCULAR
  Filled 2022-08-25: qty 10

## 2022-08-25 NOTE — Patient Instructions (Signed)
Visit Information  Thank you for taking time to visit with me today. Please don't hesitate to contact me if I can be of assistance to you.   Following are the goals we discussed today:   Goals Addressed             This Visit's Progress    Obtain Stable and Safe Housing   On track    Activities and task to complete in order to accomplish goals.   Follow up with [employer] to schedule transportation to upcoming appts Patient will contact [facility] at [phone] to inquire about delivery dates Patient will attend upcoming medical appts. Will contact [place] for transportation needs  Patient will comply with meds prescribed by doctors Follow up with DSS SW to assist with psychosocial stressors              Our next appointment is by telephone on 4/5 at 11 AM  Please call the care guide team at [phone] if you need to cancel or reschedule your appointment.   If you are experiencing a Mental Health or [place] or need someone to talk to, please call the Suicide and Crisis Lifeline: 988 call 911   The patient verbalized understanding of instructions, educational materials, and care plan provided today and DECLINED offer to receive copy of patient instructions, educational materials, and care plan.   Christa See, MSW, [email or web address] Phone [phone] 9:19 AM

## 2022-08-25 NOTE — Patient Outreach (Signed)
  Care Coordination   Follow Up Visit Note   08/24/2022 Name: Alicia Vasquez MRN: [id number] DOB: [birth year]  Alicia Vasquez is a 82 y.o. year old female who sees Alicia Vasquez, Alicia Greenhouse, MD for primary care. I spoke with  Alicia Vasquez by phone today.  What matters to the patients health and wellness today?  Transportation and Stress Management   Goals Addressed             This Visit's Progress    Obtain Stable and Safe Housing   On track    Activities and task to complete in order to accomplish goals.   Follow up with [place] to schedule transportation to upcoming appts Patient will contact [place] at [phone] to inquire about delivery dates Patient will attend upcoming medical appts. Will contact [place] for transportation needs  Patient will comply with meds prescribed by doctors Follow up with DSS SW to assist with psychosocial stressors              SDOH assessments and interventions completed:  No     Care Coordination Interventions:  Yes, provided  Interventions Today    Flowsheet Row Most Recent Value  Chronic Disease   Chronic disease during today's visit Hypertension (HTN), Other  [Depression]  General Interventions   General Interventions Discussed/Reviewed General Interventions Reviewed  Communication with RN  Alicia Vasquez collaborated with Solicitor at Alicia Vasquez and Alicia Vasquez Reviewed, Coping Strategies, Anxiety, Depression  [Pt identified multiple stressors. LCSW provided validation and encouragement. Strategies discussed to assist with management of symptoms and psychosocial stressors]  Pharmacy Interventions   Pharmacy Dicussed/Reviewed Pharmacy Topics Reviewed  [Pt is awaiting Vasquez medication delivery]       Follow up plan: Follow up call scheduled for 1-2 weeks    Encounter Outcome:  Pt. Visit  Completed   Alicia Vasquez, MSW, [place].Alicia Vasquez Phone [phone] 9:17 AM

## 2022-08-25 NOTE — Patient Instructions (Signed)
Visit Information  Thank you for taking time to visit with me today. Please don't hesitate to contact me if I can be of assistance to you.   Our next appointment is by telephone on 4/5 at 11 AM  Please call the care guide team at [phone] if you need to cancel or reschedule your appointment.   If you are experiencing a Mental Health or [place] or need someone to talk to, please call the Suicide and Crisis Lifeline: 988 call 911   Patient was not engaged during this encounter  Christa See, MSW, [email or web address] Phone [phone] 4:18 PM

## 2022-08-25 NOTE — Patient Outreach (Signed)
  Care Coordination   Follow Up Visit Note   08/25/2022 Name: Alicia Vasquez [facility] MRN: [id number] DOB: 07/08/1940  Alicia Vasquez is a 82 y.o. year old female who sees Alicia Vasquez, Alicia Greenhouse, MD for primary care. I spoke with  Alicia Vasquez by phone today.  What matters to the patients health and wellness today?  Transportation   LCSW will continue to work with Geologist, engineering at [facility] for upcoming appts with them. Pt can continue to utilize [facility] for other scheduled appts  SDOH assessments and interventions completed:  No     Care Coordination Interventions:  Yes, provided  Interventions Today    Flowsheet Row Most Recent Value  Chronic Disease   Chronic disease during today's visit Hypertension (HTN), Other  [Malignant neoplasm metastatic to bone]  General Interventions   General Interventions Discussed/Reviewed Communication with  Communication with --  Chartered certified accountant with [facility] regarding transportation services for today's appt. Driver was unable to locate patient. One way services were cancelled as [facility] scheduled transportation through [place], per chart review]       Follow up plan: Follow up call scheduled for 1 week    Encounter Outcome:  Pt. Visit Completed   Christa See, MSW, [email or web address] Phone [phone] 4:17 PM

## 2022-08-25 NOTE — Progress Notes (Signed)
[facility] Follow up:    Alicia Barrack, MD [address] [name] [address]   DIAGNOSIS:  Cancer Staging  Malignant neoplasm of upper-outer quadrant of right breast in female, estrogen receptor positive ([place]) Staging form: Breast, AJCC 7th Edition - Clinical: Stage IIA (T1, N1, cM0) - Unsigned Specimen type: Core Needle Biopsy Histopathologic type: 9931 Laterality: Right Staging comments: Staged at breast conference 07/24/13.  - Pathologic: Stage IV (M1) - Unsigned Specimen type: Core Needle Biopsy Histopathologic type: 9931 Laterality: Right   SUMMARY OF ONCOLOGIC HISTORY: 82 y.o. Alicia Vasquez status post right breast upper outer quadrant lumpectomy and sentinel lymph node sampling 09/09/2013 for an mpT1c pN1a, stage IIA invasive ductal carcinoma, estrogen and progesterone receptor both 100% positive with strong staining intensity, MIB-1 of 17% and no HER-2 amplification   (1) additional surgery for margin clearance 09/16/2013 obtained negative margins   (2) Oncotype DX recurrence score of 4 predicts a risk of outside the breast recurrence within 10 years of 7% if the patient's only systemic therapy is tamoxifen for 5 years. It also predicts no benefit from chemotherapy   (3) adjuvant radiation completed 01/07/2014   (4) anastrozole started 02/27/2014 stopped within 2 weeks because of arm swelling.              (a) bone density April 2016 showed osteopenia, with a t-score of -1.6             (b) anastrozole resumed 12/17/2015             (c) Bone density 09/20/2016 showed a T score of -2.2   (5) history of left lower extremity DVT 11/23/2012, initially on rivaroxaban, which caused chest pain, switch to Coumadin July 2014             METASTATIC DISEASE: August 2018 (6) status post left total hip replacement 01/16/2017 for estrogen receptor positive adenocarcinoma.             (a) CA 27-29 was 46.4 as of 04/18/2017             (b) chest CT  scan 05/02/2017 shows no lung or liver lesion concern; it does show aortic atherosclerosis             (c) baseline bone scan 05/02/2017 was negative             (d) PET scan 09/12/2017 shows no active disease, including bone             (e) PET scan and CT chest on 05/29/2018 show no active disease             (f) chest CT and bone scan 03/18/2019 showed no evidence of active disease             (g) lumbar spine MRI 01/06/2021 shows multiple compression fractures but no obvious metastases; noncontrast cervical spine and head CT scans 01/04/2021 showed no evidence of neoplastic disease   (7) fulvestrant started 04/18/2017   (8) denosumab/Xgeva started 05/16/2017             (a) changed to every 12-weeks after 10/03/2017 dose              (b) held starting with 06/13/2018 dose due to dental concerns   (9) unprovoked deep vein thrombosis involving the left posterior tibial v documented 11/23/2012, resolved on repeat 06/27/2013 and not recurrent on multiple Dopplers since, most recent 03/06/2018             (  a) on chronic anticoagulation with warfarin given ongoing risks (stage IV breast cancer, relative immobility secondary to claudication  CURRENT THERAPY: Fulvestrant  INTERVAL HISTORY:  Alicia Vasquez 82 y.o. female returns for follow up of her metastatic breast cancer.   She is here with her nurse Alicia Vasquez today. She once again is very worried about continuing faslodex, she complains of severe pain and how she couldn't get pain meds. She is upset that someone called Social services, apparently she now has to go to court, her bank account is frozen. She goes on and on about this. Besides the chronic pain, she denies any new pains. She is ok to proceed with scans. Rest of the pertinent 10 point ROS reviewed and negative  Patient Active Problem List   Diagnosis Date Noted   GERD (gastroesophageal reflux disease) 06/17/2021   Bilateral leg edema 06/03/2021   Pressure injury of left  buttock, stage 1 06/03/2021   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   Weakness 04/27/2021   Morbid (severe) obesity due to excess calories ([place]) 05/31/2019   Aortic atherosclerosis ([place]) 06/13/2017   Malignant neoplasm metastatic to bone ([place]) 01/25/2017   Endometrial hyperplasia 01/16/2017   Lytic bone lesion of left femur 01/15/2017   Hip fracture, pathological ([place]) 01/15/2017   Lymphedema 09/17/2015   Long term current use of anticoagulant therapy 08/23/2015   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 ([place]) 06/18/2015   Bilateral knee pain 04/03/2015   Arm edema 08/28/2014   Vitamin D deficiency 04/23/2014   Hyperparathyroidism ([place]) 04/23/2014   Depression 07/18/2013   Malignant neoplasm of upper-outer quadrant of right breast in female, estrogen receptor positive ([place]) 07/15/2013   Overactive bladder 01/30/2013   Primary hypercoagulable state ([place]) 12/19/2012   Essential hypertension 09/27/2011   Hearing loss 09/27/2011   Seasonal allergies 09/27/2011   Hypothyroidism 08/26/2011    is allergic to anesthetics, amide; benadryl [diphenhydramine hcl]; carbocaine [mepivacaine hcl]; codeine; epinephrine; sulfa antibiotics; diphenhydramine; latex; penicillins; and tramadol.  MEDICAL HISTORY: Past Medical History:  Diagnosis Date   Allergy    Anxiety    Arthritis    Blood transfusion without reported diagnosis    Breast cancer ([place]) 07/12/2013   Invasive Mammary Carcinoma   DVT (deep vein thrombosis) in pregnancy    Hypertension    Hypothyroid    Metastatic cancer to bone ([place]) dx'd 12/2016   hip   Personal history of radiation therapy    Pneumonia    PONV (postoperative nausea and vomiting)    Radiation 11/21/13-01/07/14   Right Breast/Supraclavicular    SURGICAL HISTORY: Past Surgical History:  Procedure Laterality Date   BREAST LUMPECTOMY Left 2015   BREAST LUMPECTOMY WITH RADIOACTIVE SEED LOCALIZATION Right 09/09/2013   Procedure: BREAST  LUMPECTOMY WITH RADIOACTIVE SEED LOCALIZATION WITH AXILLARY NODE EXCISION;  Surgeon: [name], MD;  Location: [facility];  Service: General;  Laterality: Right;   CYSTOSCOPY W/ URETERAL STENT PLACEMENT Right 01/07/2021   Procedure: CYSTOSCOPY WITH RETROGRADE PYELOGRAM/URETERAL STENT PLACEMENT;  Surgeon: Bjorn Loser, MD;  Location: [facility];  Service: Urology;  Laterality: Right;   CYSTOSCOPY/URETEROSCOPY/HOLMIUM LASER/STENT PLACEMENT Right 02/08/2021   Procedure: CYSTOSCOPY, RIGHT URETEROSCOPY, RIGHT RETRGRADE PYELOGRAM, HOLMIUM LASER/STENT PLACEMENT;  Surgeon: Lucas Mallow, MD;  Location[facility];  Service: Urology;  Laterality: Right;   DENTAL SURGERY  04/19/2012   13 TEETH REMOVED   DILATION AND CURETTAGE OF UTERUS     IR RADIOLOGIST EVAL & MGMT  01/21/2021   ORIF PERIPROSTHETIC  FRACTURE Left 01/31/2017   Procedure: REVISION and OPEN REDUCTION INTERNAL FIXATION (ORIF) PERIPROSTHETIC FRACTURE LEFT HIP;  Surgeon: Paralee Cancel, MD;  Location[facility];  Service: Orthopedics;  Laterality: Left;  120 mins   RE-EXCISION OF BREAST LUMPECTOMY Right 09/24/2013   Procedure: RE-EXCISION OF RIGHT BREAST LUMPECTOMY;  Surgeon: [name], MD;  Location: [facility];  Service: General;  Laterality: Right;   TOTAL HIP ARTHROPLASTY Left 01/16/2017   Procedure: TOTAL HIP ARTHROPLASTY POSTERIOR;  Surgeon: Paralee Cancel, MD;  Location[facility];  Service: Orthopedics;  Laterality: Left;    SOCIAL HISTORY: Social History   Socioeconomic History   Marital status: Widowed    Spouse name: Not on file   Number of children: 1   Years of education: Not on file   Highest education level: Not on file  Occupational History   Not on file  Tobacco Use   Smoking status: Never   Smokeless tobacco: Never  Vaping Use   Vaping Use: Never used  Substance and Sexual Activity   Alcohol use: No    Alcohol/week: 0.0 standard drinks of alcohol   Drug use: No   Sexual  activity: Never  Other Topics Concern   Not on file  Social History Narrative   Exercise: yard work.   Social Determinants of Health   Financial Resource Strain: High Risk (07/14/2022)   Overall Financial Resource Strain (CARDIA)    Difficulty of Paying Living Expenses: Hard  Food Insecurity: No Food Insecurity (07/15/2022)   Hunger Vital Sign    Worried About Running Out of Food in the Last Year: Never true    Ran Out of Food in the Last Year: Never true  Transportation Needs: Unmet Transportation Needs (08/23/2022)   PRAPARE - Hydrologist (Medical): Yes    Lack of Transportation (Non-Medical): No  Physical Activity: Inactive (09/13/2021)   Exercise Vital Sign    Days of Exercise per Week: 0 days    Minutes of Exercise per Session: 0 min  Stress: No Stress Concern Present (09/13/2021)   [place]    Feeling of Stress : Only a little  Social Connections: Socially Isolated (09/13/2021)   Social Connection and Isolation Panel [NHANES]    Frequency of Communication with Friends and Family: More than three times a week    Frequency of Social Gatherings with Friends and Family: Three times a week    Attends Religious Services: Never    Active Member of Clubs or Organizations: No    Attends Archivist Meetings: Never    Marital Status: Widowed  Intimate Partner Violence: Not At Risk (03/15/2022)   Humiliation, Afraid, Rape, and Kick questionnaire    Fear of Current or Ex-Partner: No    Emotionally Abused: No    Physically Abused: No    Sexually Abused: No    FAMILY HISTORY: Family History  Problem Relation Age of Onset   Heart disease Brother    Colon cancer Brother    Prostate cancer Brother      PHYSICAL EXAMINATION  ECOG PERFORMANCE STATUS: 3 - Symptomatic, >50% confined to bed  Vitals:   08/25/22 1351  BP: (!) 151/96  Pulse: 88  Resp: 18  Temp: 97.7 F (36.5  C)  SpO2: 97%    Physical Exam Constitutional:      General: She is not in acute distress.    Appearance: Normal appearance. She is ill-appearing (She is  chronically ill, arrived in a wheelchair). She is not toxic-appearing.  HENT:     Head: Normocephalic and atraumatic.  Eyes:     General: No scleral icterus. Cardiovascular:     Rate and Rhythm: Normal rate and regular rhythm.  Musculoskeletal:        General: Swelling present.     Cervical back: Neck supple.  Lymphadenopathy:     Cervical: No cervical adenopathy.  Skin:    General: Skin is warm and dry.     Findings: No rash.  Neurological:     General: No focal deficit present.     Mental Status: She is alert.    LABORATORY DATA:  CBC    Component Value Date/Time   WBC 8.0 08/25/2022 1217   WBC 9.7 06/28/2022 1223   RBC 4.51 08/25/2022 1217   HGB 12.8 08/25/2022 1217   HGB 11.6 05/16/2017 1417   HCT 40.5 08/25/2022 1217   HCT 36.7 05/16/2017 1417   PLT 249 08/25/2022 1217   PLT 323 05/16/2017 1417   MCV 89.8 08/25/2022 1217   MCV 80.8 05/16/2017 1417   MCH 28.4 08/25/2022 1217   MCHC 31.6 08/25/2022 1217   RDW 15.0 08/25/2022 1217   RDW 16.8 (H) 05/16/2017 1417   LYMPHSABS 1.6 08/25/2022 1217   LYMPHSABS 1.1 05/16/2017 1417   MONOABS 1.2 (H) 08/25/2022 1217   MONOABS 0.8 05/16/2017 1417   EOSABS 0.5 08/25/2022 1217   EOSABS 0.2 05/16/2017 1417   BASOSABS 0.1 08/25/2022 1217   BASOSABS 0.1 05/16/2017 1417    CMP     Component Value Date/Time   NA 140 08/25/2022 1217   NA 141 05/16/2017 1416   K 4.4 08/25/2022 1217   K 3.8 05/16/2017 1416   CL 105 08/25/2022 1217   CO2 29 08/25/2022 1217   CO2 27 05/16/2017 1416   GLUCOSE 113 (H) 08/25/2022 1217   GLUCOSE 140 05/16/2017 1416   BUN 16 08/25/2022 1217   BUN 13.2 05/16/2017 1416   CREATININE 1.09 (H) 08/25/2022 1217   CREATININE 0.80 09/22/2017 1555   CREATININE 0.8 05/16/2017 1416   CALCIUM 11.2 (H) 08/25/2022 1217   CALCIUM 11.0 (H)  05/16/2017 1416   PROT 7.1 08/25/2022 1217   PROT 6.9 05/16/2017 1416   ALBUMIN 4.0 08/25/2022 1217   ALBUMIN 3.6 05/16/2017 1416   AST 18 08/25/2022 1217   AST 13 05/16/2017 1416   ALT 13 08/25/2022 1217   ALT <6 05/16/2017 1416   ALKPHOS 159 (H) 08/25/2022 1217   ALKPHOS 130 05/16/2017 1416   BILITOT 0.5 08/25/2022 1217   BILITOT 0.31 05/16/2017 1416   GFRNONAA 50 (L) 08/25/2022 1217   GFRNONAA 75 08/19/2015 1602   GFRAA >60 08/07/2019 1305   GFRAA >60 03/21/2018 1417   GFRAA 87 08/19/2015 1602     ASSESSMENT and THERAPY PLAN:   Alicia Vasquez is a 82 y.o. 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  #Metastatic breast cancer involving the bone: --Currently on Fulvestrant injection q 4 weeks.  --Last CT scan and bone scan from 07/28/2021 showed stable compression deformities at T12, L4, and L5. No definite evidence osseous metastasis. Plan to repeat CT imaging every 6 months, next one due in September 2023.  -- She was strongly encouraged to continue Faslodex until progression or other concerns for toxicity.   -- We have once again discussed she is due for  scans to check on the status of the breast cancer. -- She doesn't have any clinical concerns for progression but once again, it is very hard to elicit history from her, she is focused on pain control and how she is upset with social services freezing her bank account. -- I ordered CT chest abdomen pelvis, bone scan. She at one point mentioned that she does not want to do the Faslodex shots but when I explained that that is okay as long as she knows that the breast cancer can progress if she discontinues her shots, she wanted to continue it again.  She had some other dental questions, encouraged her to reach out to her dentist.  She is following up with palliative care for chronic pain management.  Return to clinic in about  4 weeks to review imaging as well as to continue Faslodex injection.   All questions were answered. The patient knows to call the clinic with any problems, questions or concerns. We can certainly see the patient much sooner if necessary.  I have spent a total of 30 minutes minutes of face-to-face and non-face-to-face time, preparing to see the patient, performing a medically appropriate examination, counseling and educating the patient, ordering medications/tests, documenting clinical information in the electronic health record, and care coordination.

## 2022-08-25 NOTE — Patient Outreach (Signed)
  Care Coordination   Multidisciplinary Case Review Note    08/25/2022 Name: Alicia Vasquez [facility] MRN: [id number] DOB: Sep 20, 1940  Alicia Vasquez is a 82 y.o. year old female who sees Jerline Pain, Algis Greenhouse, MD for primary care.  The  multidisciplinary care team met today to review patient care needs and barriers.    Patient continues to reside at a long term motel with son.  Palliative care is involved for pain control.  Transportation continues to be arranged for patient at the present.  DSS involved with patient.  Per patient court date next week.  Patient wishes to stay in current environment with son.     Goals Addressed   None     SDOH assessments and interventions completed:  Yes     Care Coordination Interventions Activated:  Yes   Care Coordination Interventions:  Yes, provided   Follow up plan: Follow up call scheduled for next week    Multidisciplinary Team Attendees:   Roshanda Florance RN, Christa See, LCSW, Jon Billings, RN  Scribe for Multidisciplinary Case Review:  Jon Billings, RN  Jone Baseman, RN, MSN [facility] [facility] Care Management Coordinator Direct Line [phone]

## 2022-08-26 ENCOUNTER — Telehealth: Payer: Self-pay | Admitting: Hematology and Oncology

## 2022-08-26 ENCOUNTER — Other Ambulatory Visit: Payer: Self-pay | Admitting: Physician Assistant

## 2022-08-26 NOTE — Telephone Encounter (Signed)
Reached out to patient to schedule per 3/28 LOS ; left voicemail.

## 2022-08-29 ENCOUNTER — Inpatient Hospital Stay: Payer: Medicare Other | Attending: Oncology

## 2022-08-29 ENCOUNTER — Inpatient Hospital Stay: Payer: Medicare Other | Admitting: Nurse Practitioner

## 2022-08-29 DIAGNOSIS — Z7901 Long term (current) use of anticoagulants: Secondary | ICD-10-CM | POA: Insufficient documentation

## 2022-08-29 DIAGNOSIS — C50411 Malignant neoplasm of upper-outer quadrant of right female breast: Secondary | ICD-10-CM | POA: Insufficient documentation

## 2022-08-29 DIAGNOSIS — Z86718 Personal history of other venous thrombosis and embolism: Secondary | ICD-10-CM | POA: Insufficient documentation

## 2022-08-29 DIAGNOSIS — M8580 Other specified disorders of bone density and structure, unspecified site: Secondary | ICD-10-CM | POA: Insufficient documentation

## 2022-08-29 DIAGNOSIS — Z5111 Encounter for antineoplastic chemotherapy: Secondary | ICD-10-CM | POA: Insufficient documentation

## 2022-08-29 DIAGNOSIS — Z170 Estrogen receptor positive status [ER+]: Secondary | ICD-10-CM | POA: Insufficient documentation

## 2022-08-29 NOTE — Progress Notes (Deleted)
[facility]  [phone] [phone]   Name: Alicia Vasquez Date: 08/29/2022 MRN: [id number]  DOB: Jun 29, 1940  Patient Care Team: [name], MD as PCP - General (Family Medicine) Philemon Kingdom, MD as Consulting Physician (Internal Medicine) Caprice Renshaw, MD as Referring Physician (Internal Medicine) Regal, Tamala Fothergill, DPM as Consulting Physician (Podiatry) Bjorn Loser, MD as Consulting Physician (Urology) Benay Pike, MD as Consulting Physician (Hematology and Oncology) Paralee Cancel, MD as Consulting Physician (Orthopedic Surgery) Jon Billings, RN as [place] [facility]   INTERVAL HISTORY: [name] is a 82 y.o. female with oncology medical history including right breast cancer ER positive s/p adjuvant radiation (2015), found to have metastatic disease August 2018 s/p left total hip replacement, multiple compression fractures, currently on fulvestrant therapy.  Palliative ask to see for symptom management and goals of care.   SOCIAL HISTORY:     reports that she has never smoked. She has never used smokeless tobacco. She reports that she does not drink alcohol and does not use drugs.  ADVANCE DIRECTIVES:    CODE STATUS:   PAST MEDICAL HISTORY: Past Medical History:  Diagnosis Date   Allergy    Anxiety    Arthritis    Blood transfusion without reported diagnosis    Breast cancer ([place]) 07/12/2013   Invasive Mammary Carcinoma   DVT (deep vein thrombosis) in pregnancy    Hypertension    Hypothyroid    Metastatic cancer to bone ([place]) dx'd 12/2016   hip   Personal history of radiation therapy    Pneumonia    PONV (postoperative nausea and vomiting)    Radiation 11/21/13-01/07/14   Right Breast/Supraclavicular    ALLERGIES:  is allergic to anesthetics, amide; benadryl [diphenhydramine hcl]; carbocaine [mepivacaine hcl]; codeine; epinephrine; sulfa antibiotics;  diphenhydramine; latex; penicillins; and tramadol.  MEDICATIONS:  Current Outpatient Medications  Medication Sig Dispense Refill   cholecalciferol (VITAMIN D3) 25 MCG (1000 UNIT) tablet Take 1,000 Units by mouth daily.     levothyroxine (SYNTHROID) 125 MCG tablet Take 1 tablet (125 mcg total) by mouth daily before breakfast. 90 tablet 0   lisinopril (PRINIVIL) 10 MG tablet Take 1 tablet (10 mg total) by mouth daily. 90 tablet 1   naloxone (NARCAN) 0.4 MG/ML injection Inject 1 mL (0.4 mg total) into the muscle as needed. 1 mL 0   nitrofurantoin, macrocrystal-monohydrate, (MACROBID) 100 MG capsule Take 1 capsule (100 mg total) by mouth 2 (two) times daily. 14 capsule 0   ondansetron (ZOFRAN) 8 MG tablet TAKE ONE TABLET BY MOUTH EVERY 8 HOURS AS NEEDED FOR NAUSEA AND VOMITING 90 tablet 0   oxyCODONE (OXY IR/ROXICODONE) 5 MG immediate release tablet Take 1 tablet (5 mg total) by mouth every 6 (six) hours as needed for severe pain. 60 tablet 0   warfarin (COUMADIN) 4 MG tablet Take 1.5 tablets (6 mg total) by mouth daily at 4 PM. 60 tablet 2   No current facility-administered medications for this visit.    VITAL SIGNS: There were no vitals taken for this visit. There were no vitals filed for this visit.  Estimated body mass index is 37.77 kg/m as calculated from the following:   Height as of 08/25/22: 5\' 4"  (1.626 m).   Weight as of 03/15/22: 220 lb 0.3 oz (99.8 kg).    PERFORMANCE STATUS (ECOG) : 2 - Symptomatic, <50% confined to bed  Impression    Neoplasm related pain Ms. Martian states  her pain is controlled on current regimen. She has received her medication through pharmacy delivery. Taking as prescribed.   We reviewed regimen. She is taking oxycodone 5mg  every 6 hours as needed.    Constipation Controlled with stool softeners daily.   I discussed the importance of continued conversation with family and their medical providers regarding overall plan of care and treatment  options, ensuring decisions are within the context of the patients values and GOCs.  PLAN:  Oxycodone 5 mg every 6 hours as needed.  Zofran 8 mg every 8 hours as needed for nausea.  Patient states she generally has nausea at least 2-3 days post Faslodex injection Colace twice daily Ongoing symptom management and support as needed I will plan to see patient back in 3-4 weeks in collaboration with other oncology appointments.    Patient expressed understanding and was in agreement with this plan. She also understands that She can call the clinic at any time with any questions, concerns, or complaints.    Any controlled substances utilized were prescribed in the context of palliative care. PDMP has been reviewed.  Time Total: 45 min   Visit consisted of counseling and education dealing with the complex and emotionally intense issues of symptom management and palliative care in the setting of serious and potentially life-threatening illness.Greater than 50%  of this time was spent counseling and coordinating care related to the above assessment and plan.  Alda Lea, AGPCNP-BC  Palliative Medicine Team/[place]

## 2022-08-30 ENCOUNTER — Telehealth: Payer: Self-pay | Admitting: Pharmacist

## 2022-08-30 NOTE — Progress Notes (Signed)
Care Management & Coordination Services Pharmacy Team   Reason for Encounter: Medication coordination and delivery   Contacted patient to discuss medications and coordinate delivery from [facility] pharmacy. Unsuccessful outreach. Left voicemail for patient to return call. Cycle dispensing form sent to Leata Mouse, CPP for review.   Last adherence delivery date: 08/11/22      Patient is due for next adherence delivery on: 09/09/22  This delivery to include: Vials  30 Days   Lisinopril 10 mg  Levothyroxine 25 mcg Ondansetron 8 mg  Warfarin 4 mg  Patient declined the following medications this month:   No refill request needed.  Confirmed delivery date of 09/09/22, advised patient that pharmacy will contact them the morning of delivery.   Any concerns about your medications? {yes/no:20286}  How often do you forget or accidentally miss a dose? {Missed doses:25554}  Do you use a pillbox? {yes/no:20286}  Is patient in packaging No  If yes  What is the date on your next pill pack?  Any concerns or issues with your packaging?   Medications: Outpatient Encounter Medications as of 08/30/2022  Medication Sig   cholecalciferol (VITAMIN D3) 25 MCG (1000 UNIT) tablet Take 1,000 Units by mouth daily.   levothyroxine (SYNTHROID) 125 MCG tablet Take 1 tablet (125 mcg total) by mouth daily before breakfast.   lisinopril (PRINIVIL) 10 MG tablet Take 1 tablet (10 mg total) by mouth daily.   naloxone (NARCAN) 0.4 MG/ML injection Inject 1 mL (0.4 mg total) into the muscle as needed.   nitrofurantoin, macrocrystal-monohydrate, (MACROBID) 100 MG capsule Take 1 capsule (100 mg total) by mouth 2 (two) times daily.   ondansetron (ZOFRAN) 8 MG tablet TAKE ONE TABLET BY MOUTH EVERY 8 HOURS AS NEEDED FOR NAUSEA AND VOMITING   oxyCODONE (OXY IR/ROXICODONE) 5 MG immediate release tablet Take 1 tablet (5 mg total) by mouth every 6 (six) hours as needed for severe pain.   warfarin (COUMADIN) 4 MG tablet  Take 1.5 tablets (6 mg total) by mouth daily at 4 PM.   No facility-administered encounter medications on file as of 08/30/2022.   BP Readings from Last 3 Encounters:  08/25/22 (!) 151/96  07/26/22 (!) 157/74  07/26/22 (!) 138/54    Pulse Readings from Last 3 Encounters:  08/25/22 88  07/26/22 90  07/26/22 97    No results found for: "HGBA1C" Lab Results  Component Value Date   CREATININE 1.09 (H) 08/25/2022   BUN 16 08/25/2022   GFR 59.13 (L) 12/05/2016   GFRNONAA 50 (L) 08/25/2022   GFRAA >60 08/07/2019   NA 140 08/25/2022   K 4.4 08/25/2022   CALCIUM 11.2 (H) 08/25/2022   CO2 29 08/25/2022     Future Appointments  Date Time Provider [place]  08/31/2022 10:00 AM Jon Billings, RN [facility] None  08/31/2022  4:15 PM Marzetta Board, DPM [facility]  09/02/2022 11:00 AM Christa See D, LCSW [facility] None  09/23/2022 10:45 AM Gardenia Phlegm, NP [facility] None  09/23/2022 11:15 AM [facility] FLUSH [facility] None  09/23/2022 11:30 AM [facility] PALLIATIVE CARE [facility] None  09/26/2022  1:00 PM [facility] ANNUAL WELLNESS VISIT 1 [facility]EC  10/25/2022  1:00 PM Philemon Kingdom, MD [facility] None  12/30/2022  2:00 PM [name], MD [facility] [place]

## 2022-08-31 ENCOUNTER — Encounter: Payer: Self-pay | Admitting: Licensed Clinical Social Worker

## 2022-08-31 ENCOUNTER — Ambulatory Visit: Payer: Self-pay

## 2022-08-31 ENCOUNTER — Ambulatory Visit (INDEPENDENT_AMBULATORY_CARE_PROVIDER_SITE_OTHER): Payer: Medicare Other | Admitting: Podiatry

## 2022-08-31 ENCOUNTER — Encounter: Payer: Self-pay | Admitting: Oncology

## 2022-08-31 DIAGNOSIS — B351 Tinea unguium: Secondary | ICD-10-CM | POA: Diagnosis not present

## 2022-08-31 DIAGNOSIS — M79674 Pain in right toe(s): Secondary | ICD-10-CM | POA: Diagnosis not present

## 2022-08-31 DIAGNOSIS — L600 Ingrowing nail: Secondary | ICD-10-CM | POA: Diagnosis not present

## 2022-08-31 DIAGNOSIS — D689 Coagulation defect, unspecified: Secondary | ICD-10-CM | POA: Diagnosis not present

## 2022-08-31 DIAGNOSIS — M79675 Pain in left toe(s): Secondary | ICD-10-CM

## 2022-08-31 NOTE — Patient Outreach (Signed)
  Care Coordination   Follow Up/Collaboration Visit Note   08/31/2022 Name: Maci [facility] MRN: [id number] DOB: 04/02/1941  Nakeia Gentile Tal is a 82 y.o. year old female who sees Jerline Pain, Algis Greenhouse, MD for primary care. I  left a return message for DSS SW  What matters to the patients health and wellness today?  Patient was not engaged during this encounter   SDOH assessments and interventions completed:  No     Care Coordination Interventions:  Yes, provided   Follow up plan: Follow up call scheduled for 1-2 weeks    Encounter Outcome:  No Answer   Christa See, MSW, [email or web address] Phone [phone] 2:37 PM

## 2022-08-31 NOTE — Patient Instructions (Signed)
Visit Information  Thank you for taking time to visit with me today. Please don't hesitate to contact me if I can be of assistance to you.   Following are the goals we discussed today:   Goals Addressed             This Visit's Progress    Pain control/ getting back into her home       Patient Goals/Self Care Activities: -Patient/Caregiver will self-administer medications as prescribed as evidenced by self-report/primary caregiver report  -Patient/Caregiver will attend all scheduled provider appointments as evidenced by clinician review of documented attendance to scheduled appointments and patient/caregiver report -Patient/Caregiver will call provider office for new concerns or questions as evidenced by review of documented incoming telephone call notes and patient report  Patient reports that account has been frozen through DSS. Patient trying to figure things out. Patient went to court and was late. Patient pending outcome.   Interventions Today    Flowsheet Row Most Recent Value  Chronic Disease   Chronic disease during today's visit Hypertension (HTN), Other  [Pain control-Cancer pain]  General Interventions   General Interventions Discussed/Reviewed General Interventions Reviewed, Doctor Visits  Doctor Visits Discussed/Reviewed Doctor Visits Discussed  Education Interventions   Education Provided Provided Education  Provided Verbal Education On Other  [pain control]  Nutrition Interventions   Nutrition Discussed/Reviewed Nutrition Discussed, Decreasing salt  Pharmacy Interventions   Pharmacy Dicussed/Reviewed Medications and their functions  Safety Interventions   Safety Discussed/Reviewed Safety Discussed             Our next appointment is by telephone on 09/07/22 at 1030  Please call the care guide team at [phone] if you need to cancel or reschedule your appointment.   If you are experiencing a Mental Health or [place] or need someone to  talk to, please call the Suicide and Crisis Lifeline: 988   The patient verbalized understanding of instructions, educational materials, and care plan provided today and DECLINED offer to receive copy of patient instructions, educational materials, and care plan.   The patient has been provided with contact information for the care management team and has been advised to call with any health related questions or concerns.   Jone Baseman, RN, MSN [facility] Care Management Coordinator Direct Line [phone]

## 2022-08-31 NOTE — Patient Outreach (Signed)
  Care Coordination   Follow Up Visit Note   08/31/2022 Name: Alicia Vasquez [facility] MRN: [id number] DOB: 04-12-1941  Alicia Vasquez is a 82 y.o. year old female who sees Jerline Pain, Algis Greenhouse, MD for primary care. I spoke with  Nelle Don Burnsworth by phone today.  What matters to the patients health and wellness today?  Court pending new information    Goals Addressed             This Visit's Progress    Pain control/ getting back into her home       Patient Goals/Self Care Activities: -Patient/Caregiver will self-administer medications as prescribed as evidenced by self-report/primary caregiver report  -Patient/Caregiver will attend all scheduled provider appointments as evidenced by clinician review of documented attendance to scheduled appointments and patient/caregiver report -Patient/Caregiver will call provider office for new concerns or questions as evidenced by review of documented incoming telephone call notes and patient report  Patient reports that account has been frozen through DSS. Patient trying to figure things out. Patient went to court and was late. Patient pending outcome.   Interventions Today    Flowsheet Row Most Recent Value  Chronic Disease   Chronic disease during today's visit Hypertension (HTN), Other  [Pain control-Cancer pain]  General Interventions   General Interventions Discussed/Reviewed General Interventions Reviewed, Doctor Visits  Doctor Visits Discussed/Reviewed Doctor Visits Discussed  Education Interventions   Education Provided Provided Education  Provided Verbal Education On Other  [pain control]  Nutrition Interventions   Nutrition Discussed/Reviewed Nutrition Discussed, Decreasing salt  Pharmacy Interventions   Pharmacy Dicussed/Reviewed Medications and their functions  Safety Interventions   Safety Discussed/Reviewed Safety Discussed             SDOH assessments and interventions completed:  Yes     Care Coordination  Interventions:  Yes, provided   Follow up plan: Follow up call scheduled for 09/07/22    Encounter Outcome:  Pt. Visit Completed   Jone Baseman, RN, MSN [facility] Care Management Coordinator Direct Line [phone]

## 2022-09-01 ENCOUNTER — Encounter: Payer: Self-pay | Admitting: Licensed Clinical Social Worker

## 2022-09-01 NOTE — Patient Outreach (Signed)
  Care Coordination   Follow Up/Collaboration Visit Note   09/01/2022 Name: Ashlen [facility] MRN: [id number] DOB: [birth year]  Paylin Heavin Maron is a 82 y.o. year old female who sees Jerline Pain, Algis Greenhouse, MD for primary care. I  spoke with DSS and [place]   What matters to the patients health and wellness today?  Patient was not engaged during this encounter. LCSW left a message requesting a return call    Goals Addressed             This Visit's Progress    Obtain Stable and Safe Housing   On track    Activities and task to complete in order to accomplish goals.   Follow up with [place] to schedule transportation to upcoming appts Patient will contact [place] at [phone] to inquire about delivery dates Patient will attend upcoming medical appts. Will contact [place] for transportation needs  Patient will comply with meds prescribed by doctors Follow up with DSS SW to assist with psychosocial stressors              SDOH assessments and interventions completed:  No     Care Coordination Interventions:  Yes, provided  Interventions Today    Flowsheet Row Most Recent Value  Chronic Disease   Chronic disease during today's visit Hypertension (HTN)  General Interventions   General Interventions Discussed/Reviewed General Interventions Reviewed, Communication with  Communication with Social Work  [employer]. Transporation has not been scheduled through [facility]. LCSW will f/up with patient to assist]       Follow up plan:  LCSW will make additional attempts to f/up with patient on 4/5    Encounter Outcome:  Pt. Visit Completed   Christa See, MSW, [email or web address] Phone [phone] 5:12 PM

## 2022-09-01 NOTE — Patient Outreach (Signed)
  Care Coordination   Multidisciplinary Case Review Note    09/01/2022 Name: Alicia Vasquez Alicia Vasquez MRN: [id number] DOB: 05/08/1941  Alicia Vasquez is a 82 y.o. year old female who sees Jerline Pain, Algis Greenhouse, MD for primary care.  The  multidisciplinary care team met today to review patient care needs and barriers.   UPDATE: Patient account remains frozen by DSS. Patient missed 08/30/22 court date.  Patient waiting on next steps. RN CM and LCSW will continue to outreach for education and support   Goals Addressed   None     SDOH assessments and interventions completed:  No     Care Coordination Interventions Activated:  Yes   Care Coordination Interventions:  Yes, provided   Follow up plan: Follow up call scheduled for as scheduled    Multidisciplinary Team Attendees:   Daneen Schick, BSW, Christa See, LCSW, Enzo Montgomery, RN, Jon Billings, RN  Scribe for Multidisciplinary Case Review:  Jon Billings, RN  Jone Baseman, RN, MSN [place] [facility] Care Management Coordinator Direct [phone]

## 2022-09-02 ENCOUNTER — Encounter: Payer: Self-pay | Admitting: Family Medicine

## 2022-09-02 ENCOUNTER — Ambulatory Visit: Payer: Self-pay | Admitting: Licensed Clinical Social Worker

## 2022-09-02 ENCOUNTER — Ambulatory Visit (INDEPENDENT_AMBULATORY_CARE_PROVIDER_SITE_OTHER): Payer: Medicare Other | Admitting: Family Medicine

## 2022-09-02 ENCOUNTER — Telehealth: Payer: Self-pay | Admitting: *Deleted

## 2022-09-02 ENCOUNTER — Encounter: Payer: Self-pay | Admitting: Podiatry

## 2022-09-02 VITALS — BP 157/84 | HR 96 | Temp 98.7°F | Ht 64.0 in

## 2022-09-02 DIAGNOSIS — R300 Dysuria: Secondary | ICD-10-CM

## 2022-09-02 DIAGNOSIS — I10 Essential (primary) hypertension: Secondary | ICD-10-CM

## 2022-09-02 DIAGNOSIS — E039 Hypothyroidism, unspecified: Secondary | ICD-10-CM

## 2022-09-02 DIAGNOSIS — Z609 Problem related to social environment, unspecified: Secondary | ICD-10-CM

## 2022-09-02 MED ORDER — LISINOPRIL 10 MG PO TABS: 10.0000 mg | ORAL_TABLET | Freq: Every day | ORAL | 1 refills | Status: DC

## 2022-09-02 MED ORDER — LEVOTHYROXINE SODIUM 125 MCG PO TABS: 125.0000 ug | ORAL_TABLET | Freq: Every day | ORAL | 0 refills | Status: DC

## 2022-09-02 NOTE — Progress Notes (Signed)
  Subjective:  Patient ID: Alicia Vasquez [facility], female    DOB: 1941-04-17,  MRN: [id number]  Alicia Vasquez presents to clinic today for at risk foot care with h/o clotting disorder and painful elongated mycotic toenails 1-5 bilaterally which are tender when wearing enclosed shoe gear. Pain is relieved with periodic professional debridement.  Chief Complaint  Patient presents with   Nail Problem    [facility] PCP-[facility] PCP [facility]- 2 months ago   New problem(s): None.   PCP is Ardith Dark, MD.  Allergies  Allergen Reactions   Anesthetics, Amide Hypertension   Benadryl [Diphenhydramine Hcl] Other (See Comments)    Dizziness   Carbocaine [Mepivacaine Hcl] Hypertension   Codeine Other (See Comments)    Dizziness   Epinephrine Hypertension   Sulfa Antibiotics Other (See Comments)    dizziness   Diphenhydramine    Latex Other (See Comments)    Blisters in mouth   Penicillins Rash    Has patient had a PCN reaction causing immediate rash, facial/tongue/throat swelling, SOB or lightheadedness with hypotension: Unknown Has patient had a PCN reaction causing severe rash involving mucus membranes or skin necrosis: Unknown Has patient had a PCN reaction that required hospitalization: Unknown Has patient had a PCN reaction occurring within the last 10 years: Unknown If all of the above answers are "NO", then may proceed with Cephalosporin use.    Tramadol Other (See Comments)    Sedation.     Review of Systems: Negative except as noted in the HPI.  Objective: No changes noted in today's physical examination. There were no vitals filed for this visit. Alicia Vasquez is a pleasant 82 y.o. female morbidly obese in NAD. AAO x 3.  Vascular Examination: CFT <3 seconds b/l LE. Faintly palpable pedal pulses b/l LE. Pedal hair absent b/l LE. Skin temperature gradient WNL b/l. No pain with calf compression b/l. No edema b/l LE. No cyanosis or clubbing noted b/l LE.  Neurological  Examination: Sensation grossly intact b/l with 10 gram monofilament. Vibratory sensation intact b/l.   Dermatological Examination: Pedal skin with normal turgor, texture and tone b/l. Toenails 2-5 b/l thick, discolored, elongated with subungual debris and pain on dorsal palpation. No hyperkeratotic lesions noted b/l.   Incurvated nailplate both borders of left hallux and both borders of right hallux with old double nail plates superior to new nail plates growing in. Nail border hypertrophy absent. There is tenderness to palpation. Sign(s) of infection: no clinical signs of infection noted on examination today..  Musculoskeletal Examination: Muscle strength 5/5 to b/l LE. No pain, crepitus or joint limitation noted with ROM bilateral LE. No gross bony deformities bilaterally. Utilizes wheelchair for mobility assistance.  Radiographs: None  Assessment/Plan: 1. Pain due to onychomycosis of toenails of both feet   2. Ingrown toenail without infection   3. Blood clotting disorder     -Caregiver/provider present with patient on today's visit. -Mycotic toenails 2-5 bilaterally were debrided in length and girth with sterile nail nippers and dremel without iatrogenic bleeding. -No invasive procedure(s) performed. Offending nail border debrided and curretaged bilateral great toes utilizing sterile nail nipper and currette. Border(s) cleansed with alcohol and triple antibiotic ointment applied. Patient/POA/Caregiver/Facility instructed to apply triple antibiotic ointment  to bilateral great toes once daily for 7 days. Call office if there are any concerns. -Patient/POA to call should there be question/concern in the interim.   Return in about 3 months (around 11/30/2022).  Freddie Breech, DPM

## 2022-09-02 NOTE — Assessment & Plan Note (Addendum)
Elevated today but typically at goal on lisinopril on 10mg  daily.  I will continue to monitor at home and let us know if persistently elevated.  Will refill lisinopril today.
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
## 2022-09-02 NOTE — Assessment & Plan Note (Signed)
Follows with endocrinology for this.  On levothyroxine 125 mcg daily.
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
## 2022-09-02 NOTE — Patient Instructions (Signed)
It was very nice to see you today!  We will fax a letter to DSS.   Will check a urine sample and refill your medications today.  Take care, Dr Jimmey Ralph  PLEASE NOTE:  If you had any lab tests, please let us know if you have not heard back within a few days. You may see your results on mychart before we have a chance to review them but we will give you a call once they are reviewed by [name].   If we ordered any referrals today, please let us know if you have not heard from their office within the next week.   If you had any urgent prescriptions sent in today, please check with the pharmacy within an hour of our visit to make sure the prescription was transmitted appropriately.   Please try these tips to maintain a healthy lifestyle:  Eat at least 3 REAL meals and 1-2 snacks per day.  Aim for no more than 5 hours between eating.  If you eat breakfast, please do so within one hour of getting up.   Each meal should contain half fruits/vegetables, one quarter protein, and one quarter carbs (no bigger than a computer mouse)  Cut down on sweet beverages. This includes juice, soda, and sweet tea.   Drink at least 1 glass of water with each meal and aim for at least 8 glasses per day  Exercise at least 150 minutes every week.

## 2022-09-02 NOTE — Telephone Encounter (Signed)
This RN scheduled pt for CT CAP and bone scan - next available same day as 09/27/2022 arrive at 1130 am.  Informed Corrie Dandy of above who verbalized appts back to this RN but did state she may need transportation.  This note will be forwarded to transportation scheduler.  Note pt may also need transportation to MD appts on 09/23/2022

## 2022-09-04 LAB — URINE CULTURE
MICRO NUMBER:: 14788434
SPECIMEN QUALITY:: ADEQUATE

## 2022-09-05 ENCOUNTER — Other Ambulatory Visit: Payer: Self-pay | Admitting: *Deleted

## 2022-09-05 MED ORDER — CIPROFLOXACIN HCL 250 MG PO TABS: 250.0000 mg | ORAL_TABLET | Freq: Two times a day (BID) | ORAL | 0 refills | Status: AC

## 2022-09-05 NOTE — Patient Outreach (Signed)
  Care Coordination   Follow Up Visit Note   09/02/22 Name: Alicia Vasquez [facility] MRN: [id number] DOB: 08-24-1940  Alicia Vasquez is a 82 y.o. year old female who sees Jimmey Ralph, Katina Degree, MD for primary care. I spoke with  Ardith Dark Grieger by phone today.  What matters to the patients health and wellness today?  Transportation to upcoming appt/Symptom Management    Goals Addressed             This Visit's Progress    Obtain Stable and Safe Housing   On track    Activities and task to complete in order to accomplish goals.   [facility] to upcoming appts Patient will contact [employer] at [phone] to inquire about delivery dates Patient will attend upcoming medical appts. Will contact [facility] [employer] for transportation needs  Patient will comply with meds prescribed by doctors Follow up with DSS SW to assist with psychosocial stressors              SDOH assessments and interventions completed:  No     Care Coordination Interventions:  Yes, provided  Interventions Today    Flowsheet Row Most Recent Value  Chronic Disease   Chronic disease during today's visit Hypertension (HTN), Other  [Malignant neoplasm of upper outer quadrant,  Depression]  General Interventions   General Interventions Discussed/Reviewed General Interventions Reviewed, [employer], Doctor Visits  [Patient reminded of upcoming appt. Transportation barriers discussed. Pt's caregiver will provide transportation to upcoming appt]  Doctor Visits Discussed/Reviewed Doctor Visits Discussed  Mental Health Interventions   Mental Health Discussed/Reviewed Mental Health Reviewed, Coping Strategies  [Pt discussed stressors. Validation and Encouragement provided.]       Follow up plan: Follow up call scheduled for 1-2 weeks    Encounter Outcome:  Pt. Visit Completed   [name], MSW, LCSW [facility] Care Management [facility]   [facility] [email or web address] Phone [phone] 9:33 AM

## 2022-09-05 NOTE — Patient Instructions (Signed)
Visit Information  Thank you for taking time to visit with me today. Please don't hesitate to contact me if I can be of assistance to you.   Following are the goals we discussed today:   Goals Addressed             This Visit's Progress    Obtain Stable and Safe Housing   On track    Activities and task to complete in order to accomplish goals.   [facility] to [facility] to upcoming appts Patient will contact [employer] Customer [employer] at [phone] to inquire about delivery dates Patient will attend upcoming medical appts. Will contact [facility] [employer] for transportation needs  Patient will comply with meds prescribed by doctors Follow up with DSS SW to assist with psychosocial stressors              Our next appointment is by telephone on 4/22 at 10 AM  Please call the care guide team at [phone] if you need to cancel or reschedule your appointment.   If you are experiencing a Mental Health or Behavioral Health Crisis or need someone to talk to, please call the Suicide and Crisis Lifeline: 988 call 911   The patient verbalized understanding of instructions, educational materials, and care plan provided today and DECLINED offer to receive copy of patient instructions, educational materials, and care plan.   [name], MSW, LCSW [facility] Care Management [place]  [facility] [email or web address] Phone [phone] 9:34 AM

## 2022-09-05 NOTE — Progress Notes (Signed)
Please inform patient of the following:  Her urine culture is consistent with UTI. We can send in cipro 250mg  bid x 3 days to treat this.

## 2022-09-06 ENCOUNTER — Encounter: Payer: Self-pay | Admitting: Licensed Clinical Social Worker

## 2022-09-06 ENCOUNTER — Telehealth: Payer: Self-pay | Admitting: *Deleted

## 2022-09-06 NOTE — Telephone Encounter (Signed)
Caller was checking on Status of FL2 form for this pt. Spoke with Dr. Lavone Neri CMA and was informed that Dr. Jimmey Ralph is not going to complete it because pt doesn't have Dementia and capable of making her own medical decisions. Caller stated that they need the form for nursing home placement despite if patient has Dementia or not. Spoke with Dr. Jimmey Ralph and he said again that pt doesn't have dementia and she is capable of making her own decision and she told him that she doesn't want to go to nursing home, and he is honoring her request, therefore he is not going to sign off FL2 form. Called Joni Reining back and left her a message to call our office back.

## 2022-09-06 NOTE — Telephone Encounter (Signed)
Noted. Agree with documentation. Patient specifically asked me to NOT fill out the [facility] form because she does not want to live in a facility.   She is capable and competent to make her own medical decisions and I will honor her request out of respect for her autonomy as a patient.   Alicia Vasquez. Jimmey Ralph, MD 09/06/2022 10:03 AM

## 2022-09-06 NOTE — Telephone Encounter (Signed)
See note

## 2022-09-07 ENCOUNTER — Encounter: Payer: Self-pay | Admitting: Licensed Clinical Social Worker

## 2022-09-07 ENCOUNTER — Ambulatory Visit: Payer: Self-pay

## 2022-09-07 NOTE — Telephone Encounter (Signed)
Joni Reining from [facility] is calling back and is requiring about the FL2 form that was needed to be filled, please call her back with details.  [phone]

## 2022-09-07 NOTE — Patient Outreach (Signed)
  Care Coordination   Follow Up Visit Note   09/06/2022 Name: Shenia [facility] MRN: [id number] DOB: June 18, 1940  Alicia Vasquez is a 82 y.o. year old female who sees Jimmey Ralph, Katina Degree, MD for primary care. I  spoke with DSS SW and clinic RN  What matters to the patients health and wellness today?  Patient was not engaged during this encounter    Goals Addressed             This Visit's Progress    Obtain Stable and Safe Housing   On track    Activities and task to complete in order to accomplish goals.   Follow up with [facility] to schedule transportation to upcoming appts Patient will contact [employer] Customer Service at [phone] to inquire about delivery dates Patient will attend upcoming medical appts. 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 Requests FL2 to assist with providing supportive services to promote patient safety]  Communication with Social Work, Charity fundraiser, PCP/Specialists  [DSS SW,  RN at PCP office]       Follow up plan: Follow up call scheduled for 1 week    Encounter Outcome:  Pt. Visit Completed   [name], MSW, LCSW [facility] Care Management [facility]  [facility] [email or web address] Phone [phone] 4:09 PM

## 2022-09-07 NOTE — Patient Instructions (Signed)
Visit Information  Thank you for taking time to visit with me today. Please don't hesitate to contact me if I can be of assistance to you.   Following are the goals we discussed today:   Goals Addressed             This Visit's Progress    Pain control/ getting back into her home       Patient Goals/Self Care Activities: -Patient/Caregiver will self-administer medications as prescribed as evidenced by self-report/primary caregiver report  -Patient/Caregiver will attend all scheduled provider appointments as evidenced by clinician review of documented attendance to scheduled appointments and patient/caregiver report -Patient/Caregiver will call provider office for new concerns or questions as evidenced by review of documented incoming telephone call notes and patient report  Patient reports that account still frozen through DSS. Patient trying to figure things out.  Pain controlled when patient takes her oxycodone.  Patient reports taking medication 4 times a day. Reiterated importance of pain control.    Interventions Today    Flowsheet Row Most Recent Value  Chronic Disease   Chronic disease during today's visit Hypertension (HTN), Other  [Pain control]  General Interventions   General Interventions Discussed/Reviewed General Interventions Reviewed, [employer], Doctor Visits, Communication with  Doctor Visits Discussed/Reviewed Doctor Visits Reviewed  Communication with Social Work  [patient request for apartment resources]  Education Interventions   Education Provided Provided Education  Provided Verbal Education On Other  [pain control]  Nutrition Interventions   Nutrition Discussed/Reviewed Nutrition Reviewed, Decreasing salt             Our next appointment is by telephone on 09/20/22  Please call the care guide team at [phone] if you need to cancel or reschedule your appointment.   If you are experiencing a Mental Health or Behavioral Health Crisis or  need someone to talk to, please call the Suicide and Crisis Lifeline: 988   The patient verbalized understanding of instructions, educational materials, and care plan provided today and DECLINED offer to receive copy of patient instructions, educational materials, and care plan.   The patient has been provided with contact information for the care management team and has been advised to call with any health related questions or concerns.   Bary Leriche, RN, MSN [facility] Care Management Care Management Coordinator Direct Line [phone]

## 2022-09-07 NOTE — Patient Instructions (Signed)
Visit Information  Thank you for taking time to visit with me today. Please don't hesitate to contact me if I can be of assistance to you.   Following are the goals we discussed today:   Goals Addressed             This Visit's Progress    Obtain Stable and Safe Housing   On track    Activities and task to complete in order to accomplish goals.   Follow up with Cancer Center to schedule transportation to upcoming appts Patient will contact [employer] Customer Service at [phone] to inquire about delivery dates Patient will attend upcoming medical appts. Will contact [facility] Concierge for transportation needs  Patient will comply with meds prescribed by doctors Follow up with DSS SW to assist with psychosocial stressors             Please call the care guide team at [phone] if you need to cancel or reschedule your appointment.   If you are experiencing a Mental Health or Behavioral Health Crisis or need someone to talk to, please call the Suicide and Crisis Lifeline: 988 call 911   [name], MSW, LCSW [facility] Care Management [facility] Health  [facility] [email or web address] Phone [phone] 4:09 PM

## 2022-09-07 NOTE — Patient Outreach (Addendum)
  Care Coordination   Follow Up Visit Note   09/07/2022 Name: Alicia Vasquez MRN: [id number] DOB: Oct 09, 1940  Alicia Vasquez is a 82 y.o. year old female who sees Jimmey Ralph, Katina Degree, MD for primary care. I spoke with  Alicia Vasquez by phone today.  Patient upset about DSS situation. Patient does not want to go to a nursing home and MD will not complete FL-2 as patient does not have dementia and can make own decisions.  Bank account is still frozen. Patient actively looking for apartment via internet.  Alicia Lucks, LCSW notified to help with resources for an apartment for her and her son. .   Patient reports pain controlled with pain medication. Currently oxycodone 4 times a day for pain.   What matters to the patients health and wellness today?  Getting her account unfrozen. Currently frozen by DSS.     Goals Addressed             This Visit's Progress    Pain control/ getting back into her home       Patient Goals/Self Care Activities: -Patient/Caregiver will self-administer medications as prescribed as evidenced by self-report/primary caregiver report  -Patient/Caregiver will attend all scheduled provider appointments as evidenced by clinician review of documented attendance to scheduled appointments and patient/caregiver report -Patient/Caregiver will call provider office for new concerns or questions as evidenced by review of documented incoming telephone call notes and patient report  Patient reports that account still frozen through DSS. Patient trying to figure things out.  Pain controlled when patient takes her oxycodone.  Patient reports taking medication 4 times a day. Reiterated importance of pain control.    Interventions Today    Flowsheet Row Most Recent Value  Chronic Disease   Chronic disease during today's visit Hypertension (HTN), Other  [Pain control]  General Interventions   General Interventions Discussed/Reviewed General Interventions Reviewed,  [employer], Doctor Visits, Communication with  Doctor Visits Discussed/Reviewed Doctor Visits Reviewed  Communication with Social Work  [patient request for apartment resources]  Education Interventions   Education Provided Provided Education  Provided Verbal Education On Other  [pain control]  Nutrition Interventions   Nutrition Discussed/Reviewed Nutrition Reviewed, Decreasing salt             SDOH assessments and interventions completed:  No     Care Coordination Interventions:  Yes, provided   Follow up plan: Follow up call scheduled for 09/20/22    Encounter Outcome:  Pt. Visit Completed   Bary Leriche, RN, MSN [facility] Care Management Care Management Coordinator Direct Line [phone]

## 2022-09-08 ENCOUNTER — Encounter: Payer: Self-pay | Admitting: Licensed Clinical Social Worker

## 2022-09-08 NOTE — Patient Outreach (Signed)
  Care Coordination   Multidisciplinary Case Review Note    09/08/2022 Name: Asie Bonnette [facility] MRN: [id number] DOB: [birth year]  Amelita Whiteaker Campanelli is a 82 y.o. year old female who sees Jimmey Ralph, Katina Degree, MD for primary care.  The  multidisciplinary care team met today to review patient care needs and barriers.    SDOH assessments and interventions completed:  No     Care Coordination Interventions Activated:  Yes  Interventions Today    Flowsheet Row Most Recent Value  General Interventions   General Interventions Discussed/Reviewed Communication with  Communication with RN, Social Work       Care Coordination Interventions:  Yes, provided   Follow up plan: Follow up call scheduled for 1-2 weeks    Multidisciplinary Team Attendees:   Bevelyn Ngo, BSW Antionette Fairy, [facility] RN Fleeta Emmer, RN [name], [place]  Scribe for Multidisciplinary Case Review:   [name], MSW, LCSW [facility] Care Management [facility] Health  [facility] [email or web address] Phone [phone] 1:10 PM

## 2022-09-08 NOTE — Telephone Encounter (Signed)
See note

## 2022-09-08 NOTE — Patient Outreach (Signed)
  Care Coordination   Follow Up Visit Note   09/07/2022 Name: Alicia Vas[facility] MRN: [id number] DOB: 1940/07/03  Alicia Vasquez is a 82 y.o. year old female who sees Alicia Vasquez, Alicia Degree, MD for primary care. I  spoke with RNCM and DSS   What matters to the patients health and wellness today?  Patient was not engaged during this encounter    Goals Addressed             This Visit's Progress    Obtain Stable and Safe Housing   On track    Activities and task to complete in order to accomplish goals.   Follow up with Cancer Center to schedule transportation to upcoming appts Patient will contact [employer] Customer Service at [phone] to inquire about delivery dates Patient will attend upcoming medical appts. Will contact [facility] Concierge for transportation needs  Patient will comply with meds prescribed by doctors Follow up with DSS SW to assist with psychosocial stressors              SDOH assessments and interventions completed:  No     Care Coordination Interventions:  Yes, provided  Interventions Today    Flowsheet Row Most Recent Value  General Interventions   General Interventions Discussed/Reviewed Communication with, [employer]  [Pt requests LCSW to assist with housing resources for she and adult son. Correspondence recieved from DSS SW]  Communication with RN, Social Work  [DSS SW]       Follow up plan: Follow up call scheduled for 1-2 weeks    Encounter Outcome:  Pt. Visit Completed   Alicia Vasquez, MSW, LCSW [facility] Care Management [facility] Health  [facility] [email or web address] Phone [phone] 2:17 PM

## 2022-09-08 NOTE — Patient Instructions (Signed)
Visit Information  Thank you for taking time to visit with me today. Please don't hesitate to contact me if I can be of assistance to you.   Following are the goals we discussed today:   Goals Addressed             This Visit's Progress    Obtain Stable and Safe Housing   On track    Activities and task to complete in order to accomplish goals.   Follow up with Cancer Center to schedule transportation to upcoming appts Patient will contact [employer] Customer Service at [phone] to inquire about delivery dates Patient will attend upcoming medical appts. Will contact [facility] Concierge for transportation needs  Patient will comply with meds prescribed by doctors Follow up with DSS SW to assist with psychosocial stressors              Our next appointment is by telephone on 4/22 at 10 AM  Please call the care guide team at [phone] if you need to cancel or reschedule your appointment.   If you are experiencing a Mental Health or Behavioral Health Crisis or need someone to talk to, please call the Suicide and Crisis Lifeline: 988 call 911   The patient verbalized understanding of instructions, educational materials, and care plan provided today and DECLINED offer to receive copy of patient instructions, educational materials, and care plan.   [name], MSW, LCSW [facility] Care Management [place]  [facility] [email or web address] Phone [phone] 2:18 PM

## 2022-09-13 ENCOUNTER — Other Ambulatory Visit: Payer: Self-pay

## 2022-09-13 ENCOUNTER — Ambulatory Visit: Payer: Self-pay | Admitting: Licensed Clinical Social Worker

## 2022-09-13 MED ORDER — ONDANSETRON HCL 8 MG PO TABS: ORAL_TABLET | ORAL | 0 refills | Status: DC

## 2022-09-14 ENCOUNTER — Other Ambulatory Visit: Payer: Self-pay

## 2022-09-14 DIAGNOSIS — Z515 Encounter for palliative care: Secondary | ICD-10-CM

## 2022-09-14 DIAGNOSIS — M84459A Pathological fracture, hip, unspecified, initial encounter for fracture: Secondary | ICD-10-CM

## 2022-09-14 DIAGNOSIS — C7951 Secondary malignant neoplasm of bone: Secondary | ICD-10-CM

## 2022-09-14 DIAGNOSIS — G893 Neoplasm related pain (acute) (chronic): Secondary | ICD-10-CM

## 2022-09-14 MED ORDER — OXYCODONE HCL 5 MG PO TABS
5.0000 mg | ORAL_TABLET | Freq: Four times a day (QID) | ORAL | 0 refills | Status: DC | PRN
Start: 2022-09-14 — End: 2022-10-03

## 2022-09-14 NOTE — Telephone Encounter (Signed)
Pt called for refill, see new orders.  

## 2022-09-14 NOTE — Telephone Encounter (Signed)
LMOVM with Joni Reining to call me back

## 2022-09-14 NOTE — Telephone Encounter (Signed)
I will not be filling out the FL2 form per the patient's request. IF patient changes her mind and wishes for [name] to complete it then I will fill it out.  Katina Degree. Jimmey Ralph, MD 09/14/2022 9:21 AM

## 2022-09-15 NOTE — Telephone Encounter (Signed)
Noted  

## 2022-09-15 NOTE — Patient Outreach (Signed)
  Care Coordination Late Entry  Follow Up Visit Note   Encounter completed on 09/13/2022 Name: Alicia Vasquez [facility] MRN: [id number] DOB: [birth year]  Alicia Vasquez is a 82 y.o. year old female who sees Ardith Dark, MD for primary care. I spoke with  Ardith Dark [place] by phone today.  What matters to the patients health and wellness today?  Stress Management/Transportation    Goals Addressed             This Visit's Progress    Obtain Stable and Safe Housing   On track    Activities and task to complete in order to accomplish goals.   Follow up with Cancer Center to schedule transportation to upcoming appts Patient will contact [employer] Customer Service at [phone] to inquire about delivery dates Patient will attend upcoming medical appts. Will contact [facility] Concierge for transportation needs  Patient will comply with meds prescribed by doctors Follow up with DSS SW to assist with psychosocial stressors. 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 Stress management strategies discussed to promote relaxation and self-care.]  Nutrition Interventions   Nutrition Discussed/Reviewed Nutrition Reviewed  Pharmacy Interventions   Pharmacy Dicussed/Reviewed  Pharmacy Topics Reviewed, Medication Adherence       Follow up plan: Follow up call scheduled for 1-2 weeks    Encounter Outcome:  Pt. Visit Completed   [name], MSW, LCSW [facility] Care Management [facility] Health  [facility] [email or web address] Phone [phone] 5:55 AM

## 2022-09-15 NOTE — Telephone Encounter (Signed)
Spoke with Nicole,informed her who I was and who I was calling about, she told me our services were no longer needed and ended the phone call.

## 2022-09-15 NOTE — Patient Instructions (Signed)
Visit Information  Thank you for taking time to visit with me today. Please don't hesitate to contact me if I can be of assistance to you.   Following are the goals we discussed today:   Goals Addressed             This Visit's Progress    Obtain Stable and Safe Housing   On track    Activities and task to complete in order to accomplish goals.   Follow up with Cancer Center to schedule transportation to upcoming appts Patient will contact [employer] Customer Service at [phone] to inquire about delivery dates Patient will attend upcoming medical appts. Will contact [facility] Concierge for transportation needs  Patient will comply with meds prescribed by doctors Follow up with DSS SW to assist with psychosocial stressors. Pt states her goal is to write letter to DSS Director to assist with obtaining assets             Please call the care guide team at [phone] if you need to cancel or reschedule your appointment.   If you are experiencing a Mental Health or Behavioral Health Crisis or need someone to talk to, please call the Suicide and Crisis Lifeline: 988 call 911   The patient verbalized understanding of instructions, educational materials, and care plan provided today and DECLINED offer to receive copy of patient instructions, educational materials, and care plan.   [name], MSW, LCSW [facility] Care Management [place]  [facility] [email or web address] Phone [phone] 5:56 AM

## 2022-09-16 ENCOUNTER — Encounter: Payer: Self-pay | Admitting: Licensed Clinical Social Worker

## 2022-09-19 ENCOUNTER — Ambulatory Visit: Payer: Self-pay | Admitting: Licensed Clinical Social Worker

## 2022-09-20 ENCOUNTER — Ambulatory Visit: Payer: Self-pay

## 2022-09-20 NOTE — Patient Instructions (Signed)
Visit Information  Thank you for taking time to visit with me today. Please don't hesitate to contact me if I can be of assistance to you.   Following are the goals we discussed today:   Goals Addressed             This Visit's Progress    Pain control/ getting back into her home       Patient Goals/Self Care Activities: -Patient/Caregiver will self-administer medications as prescribed as evidenced by self-report/primary caregiver report  -Patient/Caregiver will attend all scheduled provider appointments as evidenced by clinician review of documented attendance to scheduled appointments and patient/caregiver report -Patient/Caregiver will call provider office for new concerns or questions as evidenced by review of documented incoming telephone call notes and patient report  Patient reports that account still frozen through DSS. Patient wrote letter to DSS to try to get account unfrozen.  Patient has food and hotel is allowing them to stay at this time.   Pain controlled when patient takes her oxycodone.  Patient reports taking medication 4 times a day. Reiterated importance of pain control.    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.]  General Interventions   General Interventions Discussed/Reviewed General Interventions Reviewed, Doctor Visits  Doctor Visits Discussed/Reviewed Doctor Visits Reviewed, Specialist  PCP/Specialist Visits Compliance with follow-up visit  Education Interventions   Education Provided Provided Education  Provided Verbal Education On Other  [pain control]  Nutrition Interventions   Nutrition Discussed/Reviewed Nutrition Reviewed, Decreasing salt  Pharmacy Interventions   Pharmacy Dicussed/Reviewed Pharmacy Topics Reviewed             Our next appointment is by telephone on 10/04/22 at 1200 pm  Please call the care  guide team at [phone] if you need to cancel or reschedule your appointment.   If you are experiencing a Mental Health or Behavioral Health Crisis or need someone to talk to, please call the Suicide and Crisis Lifeline: 988   Patient verbalizes understanding of instructions and care plan provided today and agrees to view in MyChart. Active MyChart status and patient understanding of how to access instructions and care plan via MyChart confirmed with patient.     The patient has been provided with contact information for the care management team and has been advised to call with any health related questions or concerns.   Bary Leriche, RN, MSN [facility] Care Management Care Management Coordinator Direct Line [phone]

## 2022-09-20 NOTE — Patient Outreach (Signed)
  Care Coordination   Follow Up Visit Note   09/20/2022 Name: Alicia Vasquez [facility] MRN: [id number] DOB: [birth year]  Alicia Vasquez is a 82 y.o. year old female who sees Jimmey Ralph, Katina Degree, MD for primary care. I spoke with  Alicia Vasquez by phone today.  What matters to the patients health and wellness today?  Getting account unfrozen    Goals Addressed             This Visit's Progress    Pain control/ getting back into her home       Patient Goals/Self Care Activities: -Patient/Caregiver will self-administer medications as prescribed as evidenced by self-report/primary caregiver report  -Patient/Caregiver will attend all scheduled provider appointments as evidenced by clinician review of documented attendance to scheduled appointments and patient/caregiver report -Patient/Caregiver will call provider office for new concerns or questions as evidenced by review of documented incoming telephone call notes and patient report  Patient reports that account still frozen through DSS. Patient wrote letter to DSS to try to get account unfrozen.  Patient has food and hotel is allowing them to stay at this time.   Pain controlled when patient takes her oxycodone.  Patient reports taking medication 4 times a day. Reiterated importance of pain control.    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.]  General Interventions   General Interventions Discussed/Reviewed General Interventions Reviewed, Doctor Visits  Doctor Visits Discussed/Reviewed Doctor Visits Reviewed, Specialist  PCP/Specialist Visits Compliance with follow-up visit  Education Interventions   Education Provided Provided Education  Provided Verbal Education On Other  [pain control]  Nutrition Interventions   Nutrition Discussed/Reviewed Nutrition Reviewed, Decreasing salt  Pharmacy  Interventions   Pharmacy Dicussed/Reviewed Pharmacy Topics Reviewed             SDOH assessments and interventions completed:  Yes     Care Coordination Interventions:  Yes, provided    Follow up plan: Follow up call scheduled for May    Encounter Outcome:  Pt. Visit Completed   Bary Leriche, RN, MSN [facility] Care Management Care Management Coordinator Direct Line [phone]

## 2022-09-20 NOTE — Patient Outreach (Signed)
  Care Coordination   Follow Up Visit Note   09/19/2022 Name: Dhriti Fales [place] [facility] MRN: [id number] DOB: 07/08/1940  Carolynn Tuley Sheldon is a 82 y.o. year old female who sees Jimmey Ralph, Katina Degree, MD for primary care. I spoke with  Ardith Dark Littles by phone today.  What matters to the patients health and wellness today?  Transportation/Symptom Management    Goals Addressed             This Visit's Progress    Obtain Stable and Safe Housing   On track    Activities and task to complete in order to accomplish goals.   Follow up with [facility] to schedule transportation to upcoming appts Patient will contact [employer] at [phone] to inquire about delivery dates Patient will attend upcoming medical appts. Will contact [facility] Concierge for transportation needs  Patient will comply with meds prescribed by doctors Follow up with DSS SW to assist with psychosocial stressors. Pt states her goal is to write letter to DSS Director to assist with obtaining assets              SDOH assessments and interventions completed:  No     Care Coordination Interventions:  Yes, provided  Interventions Today    Flowsheet Row Most Recent Value  Chronic Disease   Chronic disease during today's visit Hypertension (HTN)  General Interventions   General Interventions Discussed/Reviewed [employer], General Interventions Reviewed, Communication with  Communication with --  [Transportation Coordinator]  Mental Health Interventions   Mental Health Discussed/Reviewed Mental Health Reviewed, Coping Strategies, Anxiety  [Validation and Encouragement provided. Reflective Listening skills and strategies to promote self care]       Follow up plan: Follow up call scheduled for 1-2 weeks    Encounter Outcome:  Pt. Visit Completed   [name], MSW, LCSW [facility] Care Management [facility]  [facility] [email or web address] Phone [phone] 10:52 PM

## 2022-09-20 NOTE — Patient Instructions (Signed)
Visit Information  Thank you for taking time to visit with me today. Please don't hesitate to contact me if I can be of assistance to you.   Following are the goals we discussed today:   Goals Addressed             This Visit's Progress    Obtain Stable and Safe Housing   On track    Activities and task to complete in order to accomplish goals.   Follow up with Cancer Center to schedule transportation to upcoming appts Patient will contact [employer] Customer [employer] at [phone] to inquire about delivery dates Patient will attend upcoming medical appts. Will contact [facility] Concierge for transportation needs  Patient will comply with meds prescribed by doctors Follow up with DSS SW to assist with psychosocial stressors. Pt states her goal is to write letter to DSS Director to assist with obtaining assets              Please call the care guide team at [phone] if you need to cancel or reschedule your appointment.   If you are experiencing a Mental Health or Behavioral Health Crisis or need someone to talk to, please call the Suicide and Crisis Lifeline: 988 call 911   The patient verbalized understanding of instructions, educational materials, and care plan provided today and DECLINED offer to receive copy of patient instructions, educational materials, and care plan.   [name], MSW, LCSW [facility] Care Management [place]  [facility] [email or web address] Phone [phone] 10:53 PM

## 2022-09-21 NOTE — Progress Notes (Signed)
Palliative Medicine [facility]  Telephone:(336) [phone] [phone]) [phone]   Name: Alicia Vasquez Date: 09/21/2022 MRN: [id number]  DOB: 01-09-1941  Patient Care Team: Ardith Dark, MD as PCP - General (Family Medicine) Carlus Pavlov, MD as Consulting Physician (Internal Medicine) Charlynne Pander, MD as Referring Physician (Internal Medicine) Regal, Kirstie Peri, DPM as Consulting Physician (Podiatry) Alfredo Martinez, MD as Consulting Physician (Urology) Rachel Moulds, MD as Consulting Physician (Hematology and Oncology) Durene Romans, MD as Consulting Physician (Orthopedic Surgery) Fleeta Emmer, RN as [facility] Care Management   INTERVAL HISTORY: Alicia Vasquez is a 82 y.o. female with oncology medical history including right breast cancer ER positive s/p adjuvant radiation (2015), found to have metastatic disease August 2018 s/p left total hip replacement, multiple compression fractures, currently on fulvestrant therapy.  Palliative ask to see for symptom management and goals of care.   SOCIAL HISTORY:     reports that she has never smoked. She has never used smokeless tobacco. She reports that she does not drink alcohol and does not use drugs.  ADVANCE DIRECTIVES:    CODE STATUS:   PAST MEDICAL HISTORY: Past Medical History:  Diagnosis Date   Allergy    Anxiety    Arthritis    Blood transfusion without reported diagnosis    Breast cancer 07/12/2013   Invasive Mammary Carcinoma   DVT (deep vein thrombosis) in pregnancy    Hypertension    Hypothyroid    Metastatic cancer to bone dx'd 12/2016   hip   Personal history of radiation therapy    Pneumonia    PONV (postoperative nausea and vomiting)    Radiation 11/21/13-01/07/14   Right Breast/Supraclavicular    ALLERGIES:  is allergic to anesthetics, amide; benadryl [diphenhydramine hcl]; carbocaine [mepivacaine hcl]; codeine; epinephrine; sulfa antibiotics;  diphenhydramine; latex; penicillins; and tramadol.  MEDICATIONS:  Current Outpatient Medications  Medication Sig Dispense Refill   cholecalciferol (VITAMIN D3) 25 MCG (1000 UNIT) tablet Take 1,000 Units by mouth daily.     levothyroxine (SYNTHROID) 125 MCG tablet Take 1 tablet (125 mcg total) by mouth daily before breakfast. 90 tablet 0   lisinopril (PRINIVIL) 10 MG tablet Take 1 tablet (10 mg total) by mouth daily. 90 tablet 1   naloxone (NARCAN) 0.4 MG/ML injection Inject 1 mL (0.4 mg total) into the muscle as needed. 1 mL 0   ondansetron (ZOFRAN) 8 MG tablet TAKE ONE TABLET BY MOUTH EVERY 8 HOURS AS NEEDED FOR NAUSEA AND VOMITING 90 tablet 0   oxyCODONE (OXY IR/ROXICODONE) 5 MG immediate release tablet Take 1 tablet (5 mg total) by mouth every 6 (six) hours as needed for severe pain. 60 tablet 0   warfarin (COUMADIN) 4 MG tablet Take 1.5 tablets (6 mg total) by mouth daily at 4 PM. 60 tablet 2   No current facility-administered medications for this visit.    VITAL SIGNS: There were no vitals taken for this visit. There were no vitals filed for this visit.  Estimated body mass index is 37.77 kg/m as calculated from the following:   Height as of 09/02/22: 5\' 4"  (1.626 m).   Weight as of 03/15/22: 220 lb 0.3 oz (99.8 kg).    PERFORMANCE STATUS (ECOG) : 2 - Symptomatic, <50% confined to bed  Impression  Ms. Lombardozzi presents to clinic for symptom management follow-up. Her friend/caregiver is present. Patient denies nausea, vomiting, constipation, or diarrhea. She appears much disheveled today. Is tearful as she speaks about her home  life and conditions. Emotional support provided.   Neoplasm related pain Ms. Buch states her pain is mostly controlled on current regimen. She receives her medications through pharmacy delivery. Taking as prescribed. Endorses ongoing ankle and shoulder pain. States this may be due to how she sleeps as some days are better than others. She is taking Tylenol  325mg  twice daily. Advised she make take 2 tablets three times daily for mild aches and pains. She and caregiver verbalized understanding.   We reviewed regimen. She is taking oxycodone 5mg  every 6 hours as needed. No changes to be made. Will continue to support and follow.    Constipation Controlled with stool softeners daily.   3. Dyspepsia Patient is complaining of indigestion. This occurs daily. Associates this with certain foods or when lying down. She denies any use of over the counter antacid. Education provided on use of protonix daily.   I discussed the importance of continued conversation with family and their medical providers regarding overall plan of care and treatment options, ensuring decisions are within the context of the patients values and GOCs.  PLAN:  Oxycodone 5 mg every 6 hours as needed.  Zofran 8 mg every 8 hours as needed for nausea.  Patient states she generally has nausea at least 2-3 days post Faslodex injection Colace twice daily Tylenol 650mg  three times daily Protonix 40 mg daily  Ongoing symptom management and support as needed I will plan to see patient back in 3-4 weeks in collaboration with other oncology appointments.    Patient expressed understanding and was in agreement with this plan. She also understands that She can call the clinic at any time with any questions, concerns, or complaints.    Any controlled substances utilized were prescribed in the context of palliative care. PDMP has been reviewed.    Visit consisted of counseling and education dealing with the complex and emotionally intense issues of symptom management and palliative care in the setting of serious and potentially life-threatening illness.Greater than 50%  of this time was spent counseling and coordinating care related to the above assessment and plan.  [name], AGPCNP-BC  Palliative Medicine Team/[facility]  *Please note that this is a verbal  dictation therefore any spelling or grammatical errors are due to the "Dragon Medical One" system interpretation.

## 2022-09-22 ENCOUNTER — Telehealth: Payer: Self-pay

## 2022-09-22 ENCOUNTER — Telehealth: Payer: Self-pay | Admitting: Licensed Clinical Social Worker

## 2022-09-22 NOTE — Patient Instructions (Signed)
Visit Information  Thank you for taking time to visit with me today. Please don't hesitate to contact me if I can be of assistance to you.   Following are the goals we discussed today:   Goals Addressed             This Visit's Progress    Pain control/ getting back into her home       Patient Goals/Self Care Activities: -Patient/Caregiver will self-administer medications as prescribed as evidenced by self-report/primary caregiver report  -Patient/Caregiver will attend all scheduled provider appointments as evidenced by clinician review of documented attendance to scheduled appointments and patient/caregiver report -Patient/Caregiver will call provider office for new concerns or questions as evidenced by review of documented incoming telephone call notes and patient report  Patient reports that account still frozen through DSS. Patient wrote letter to DSS to try to get account unfrozen.  Patient has food and hotel is allowing them to stay at this time.   Pain controlled when patient takes her oxycodone.  Patient reports taking medication 4 times a day. Reiterated importance of pain control.    Interventions Today    Flowsheet Row Most Recent Value  Chronic Disease   Chronic disease during today's visit Hypertension (HTN), Other  [Breast cancer with mets]  General Interventions   General Interventions Discussed/Reviewed General Interventions Reviewed, Communication with  Communication with Social Work  Education Interventions   Education Provided Provided Education  Provided Verbal Education On Other  [pain control and transportation to appointments]             Our next appointment is by telephone on 10/04/22 at 1200 pm  Please call the care guide team at [phone] if you need to cancel or reschedule your appointment.   If you are experiencing a Mental Health or Behavioral Health Crisis or need someone to talk to, please call the Suicide and Crisis Lifeline: 988   The  patient verbalized understanding of instructions, educational materials, and care plan provided today and DECLINED offer to receive copy of patient instructions, educational materials, and care plan.   The patient has been provided with contact information for the care management team and has been advised to call with any health related questions or concerns.   Bary Leriche, RN, MSN [facility] Care Management Care Management Coordinator Direct Line [phone]

## 2022-09-22 NOTE — Patient Outreach (Signed)
  Care Coordination   Follow Up Visit Note   09/22/2022 Name: Alicia Vasquez [facility] MRN: [id number] DOB: January 07, 1941  Alicia Vasquez is a 82 y.o. year old female who sees Alicia Vasquez, Alicia Degree, MD for primary care. I spoke with  Alicia Vasquez by phone today.  What matters to the patients health and wellness today?  Symptom Management/Transportation    Goals Addressed             This Visit's Progress    Obtain Stable and Safe Housing   On track    Activities and task to complete in order to accomplish goals.   Follow up with transportation company for tomorrow's appt at the [facility]  Patient will comply with meds prescribed by doctors Continue working with [facility] to reach identified goals               SDOH assessments and interventions completed:  No     Care Coordination Interventions:  Yes, provided  Interventions Today    Flowsheet Row Most Recent Value  Chronic Disease   Chronic disease during today's visit Hypertension (HTN), Other  [[place] neoplasm,  Depression]  General Interventions   General Interventions Discussed/Reviewed General Interventions Reviewed, Communication with  [Reviewed upcoming appts and informed pt transportation has been scheduled through Cancer Center. Agency contact # provided]  Doctor Visits Discussed/Reviewed Doctor Visits Discussed  Communication with Social Work  [employer] collaborated with Scientist, research (life sciences) and DSS regarding status of case]  Mental Health Interventions   Mental Health Discussed/Reviewed Mental Health Reviewed, Coping Strategies, Anxiety       Follow up plan: Follow up call scheduled for 1-2 weeks    Encounter Outcome:  Pt. Visit Completed   [name], MSW, LCSW [facility] Care Management [facility]  [facility] [name] Phone [phone] 4:40 PM

## 2022-09-22 NOTE — Patient Outreach (Signed)
  Care Coordination   Follow Up Visit Note   09/22/2022 Name: Alicia Vasquez [facility] MRN: [id number] DOB: 09-14-1940  Alicia Vasquez is a 82 y.o. year old female who sees Jimmey Ralph, Katina Degree, MD for primary care. I spoke with  Alicia Vasquez by phone today.Incoming call from patient. She is concerned about transportation to her appointment to the cancer center.  Advised I saw not sure.  Patient pain controlled with medication.  Patient transferred to [facility] for information on her transportation for upcoming appointment.    What matters to the patients health and wellness today?  Follow up with [name] on transportation    Goals Addressed             This Visit's Progress    Pain control/ getting back into her home       Patient Goals/Self Care Activities: -Patient/Caregiver will self-administer medications as prescribed as evidenced by self-report/primary caregiver report  -Patient/Caregiver will attend all scheduled provider appointments as evidenced by clinician review of documented attendance to scheduled appointments and patient/caregiver report -Patient/Caregiver will call provider office for new concerns or questions as evidenced by review of documented incoming telephone call notes and patient report  Patient reports that account still frozen through DSS. Patient wrote letter to DSS to try to get account unfrozen.  Patient has food and hotel is allowing them to stay at this time.   Pain controlled when patient takes her oxycodone.  Patient reports taking medication 4 times a day. Reiterated importance of pain control.    Interventions Today    Flowsheet Row Most Recent Value  Chronic Disease   Chronic disease during today's visit Hypertension (HTN), Other  [Breast cancer with mets]  General Interventions   General Interventions Discussed/Reviewed General Interventions Reviewed, Communication with  Communication with Social Work  Education Interventions   Education Provided  Provided Education  Provided Verbal Education On Other  [pain control and transportation to appointments]             SDOH assessments and interventions completed:  Yes     Care Coordination Interventions:  Yes, provided   Follow up plan:  Oct 04, 2022    Encounter Outcome:  Pt. Visit Completed   Bary Leriche, RN, MSN [facility] Care Management Care Management Coordinator Direct Line [phone]

## 2022-09-22 NOTE — Patient Instructions (Signed)
Visit Information  Thank you for taking time to visit with me today. Please don't hesitate to contact me if I can be of assistance to you.   Following are the goals we discussed today:   Goals Addressed             This Visit's Progress    Obtain Stable and Safe Housing   On track    Activities and task to complete in order to accomplish goals.   Follow up with transportation company for [facility]'s appt at the [facility]  Patient will comply with meds prescribed by doctors Continue working with [facility] to reach identified goals               Our next appointment is by telephone on 5/3 at 10:30 AM  Please call the care guide team at [phone] if you need to cancel or reschedule your appointment.   If you are experiencing a Mental Health or Behavioral Health Crisis or need someone to talk to, please call the Suicide and Crisis Lifeline: 988 call 911   The patient verbalized understanding of instructions, educational materials, and care plan provided today and DECLINED offer to receive copy of patient instructions, educational materials, and care plan.   [name], MSW, LCSW [facility] Care Management [place]  [facility] [email or web address] Phone [phone] 4:41 PM

## 2022-09-23 ENCOUNTER — Other Ambulatory Visit: Payer: Self-pay | Admitting: *Deleted

## 2022-09-23 ENCOUNTER — Inpatient Hospital Stay: Payer: Medicare Other | Admitting: Adult Health

## 2022-09-23 ENCOUNTER — Other Ambulatory Visit: Payer: Self-pay

## 2022-09-23 ENCOUNTER — Inpatient Hospital Stay: Payer: Medicare Other

## 2022-09-23 ENCOUNTER — Encounter: Payer: Self-pay | Admitting: Nurse Practitioner

## 2022-09-23 ENCOUNTER — Encounter: Payer: Self-pay | Admitting: Oncology

## 2022-09-23 ENCOUNTER — Inpatient Hospital Stay (HOSPITAL_BASED_OUTPATIENT_CLINIC_OR_DEPARTMENT_OTHER): Payer: Medicare Other | Admitting: Nurse Practitioner

## 2022-09-23 ENCOUNTER — Inpatient Hospital Stay (HOSPITAL_BASED_OUTPATIENT_CLINIC_OR_DEPARTMENT_OTHER): Payer: Medicare Other | Admitting: Adult Health

## 2022-09-23 VITALS — BP 153/55 | HR 83 | Temp 97.9°F | Resp 16 | Ht 64.0 in | Wt 208.1 lb

## 2022-09-23 VITALS — BP 140/62 | HR 79 | Resp 16

## 2022-09-23 DIAGNOSIS — G893 Neoplasm related pain (acute) (chronic): Secondary | ICD-10-CM

## 2022-09-23 DIAGNOSIS — D6859 Other primary thrombophilia: Secondary | ICD-10-CM

## 2022-09-23 DIAGNOSIS — Z515 Encounter for palliative care: Secondary | ICD-10-CM | POA: Diagnosis not present

## 2022-09-23 DIAGNOSIS — Z170 Estrogen receptor positive status [ER+]: Secondary | ICD-10-CM

## 2022-09-23 DIAGNOSIS — M84659P Pathological fracture in other disease, hip, unspecified, subsequent encounter for fracture with malunion: Secondary | ICD-10-CM

## 2022-09-23 DIAGNOSIS — Z7901 Long term (current) use of anticoagulants: Secondary | ICD-10-CM | POA: Diagnosis not present

## 2022-09-23 DIAGNOSIS — M899 Disorder of bone, unspecified: Secondary | ICD-10-CM

## 2022-09-23 DIAGNOSIS — Z5111 Encounter for antineoplastic chemotherapy: Secondary | ICD-10-CM | POA: Diagnosis not present

## 2022-09-23 DIAGNOSIS — K5900 Constipation, unspecified: Secondary | ICD-10-CM | POA: Diagnosis not present

## 2022-09-23 DIAGNOSIS — M8580 Other specified disorders of bone density and structure, unspecified site: Secondary | ICD-10-CM | POA: Diagnosis not present

## 2022-09-23 DIAGNOSIS — R530 Neoplastic (malignant) related fatigue: Secondary | ICD-10-CM

## 2022-09-23 DIAGNOSIS — C50411 Malignant neoplasm of upper-outer quadrant of right female breast: Secondary | ICD-10-CM | POA: Diagnosis not present

## 2022-09-23 DIAGNOSIS — Z86718 Personal history of other venous thrombosis and embolism: Secondary | ICD-10-CM | POA: Diagnosis not present

## 2022-09-23 LAB — CBC WITH DIFFERENTIAL (CANCER CENTER ONLY)
Abs Immature Granulocytes: 0.03 10*3/uL (ref 0.00–0.07)
Basophils Absolute: 0.1 10*3/uL (ref 0.0–0.1)
Basophils Relative: 1 %
Eosinophils Absolute: 0.3 10*3/uL (ref 0.0–0.5)
Eosinophils Relative: 3 %
HCT: 39 % (ref 36.0–46.0)
Hemoglobin: 12.2 g/dL (ref 12.0–15.0)
Immature Granulocytes: 0 %
Lymphocytes Relative: 17 %
Lymphs Abs: 1.3 10*3/uL (ref 0.7–4.0)
MCH: 27.9 pg (ref 26.0–34.0)
MCHC: 31.3 g/dL (ref 30.0–36.0)
MCV: 89 fL (ref 80.0–100.0)
Monocytes Absolute: 0.9 10*3/uL (ref 0.1–1.0)
Monocytes Relative: 11 %
Neutro Abs: 5.1 10*3/uL (ref 1.7–7.7)
Neutrophils Relative %: 68 %
Platelet Count: 266 10*3/uL (ref 150–400)
RBC: 4.38 MIL/uL (ref 3.87–5.11)
RDW: 15.2 % (ref 11.5–15.5)
WBC Count: 7.6 10*3/uL (ref 4.0–10.5)
nRBC: 0 % (ref 0.0–0.2)

## 2022-09-23 LAB — CMP (CANCER CENTER ONLY)
ALT: 11 U/L (ref 0–44)
AST: 16 U/L (ref 15–41)
Albumin: 3.8 g/dL (ref 3.5–5.0)
Alkaline Phosphatase: 137 U/L — ABNORMAL HIGH (ref 38–126)
Anion gap: 6 (ref 5–15)
BUN: 15 mg/dL (ref 8–23)
CO2: 29 mmol/L (ref 22–32)
Calcium: 11.3 mg/dL — ABNORMAL HIGH (ref 8.9–10.3)
Chloride: 105 mmol/L (ref 98–111)
Creatinine: 1 mg/dL (ref 0.44–1.00)
GFR, Estimated: 56 mL/min — ABNORMAL LOW (ref >60)
Glucose, Bld: 132 mg/dL — ABNORMAL HIGH (ref 70–99)
Potassium: 4 mmol/L (ref 3.5–5.1)
Sodium: 140 mmol/L (ref 135–145)
Total Bilirubin: 0.4 mg/dL (ref 0.3–1.2)
Total Protein: 6.7 g/dL (ref 6.5–8.1)

## 2022-09-23 LAB — PROTIME-INR
INR: 1.1 (ref 0.8–1.2)
Prothrombin Time: 14.6 seconds (ref 11.4–15.2)

## 2022-09-23 MED ORDER — FULVESTRANT 250 MG/5ML IM SOSY
500.0000 mg | PREFILLED_SYRINGE | Freq: Once | INTRAMUSCULAR | Status: AC
  Administered 2022-09-23: 500 mg via INTRAMUSCULAR
  Filled 2022-09-23: qty 10

## 2022-09-23 MED ORDER — PANTOPRAZOLE SODIUM 40 MG PO TBEC: 40.0000 mg | DELAYED_RELEASE_TABLET | Freq: Every day | ORAL | 2 refills | Status: DC

## 2022-09-23 NOTE — Progress Notes (Signed)
[facility] Follow up:    Alicia Barrack, MD [address] [name] [address]   DIAGNOSIS:  Cancer Staging  Malignant neoplasm of upper-outer quadrant of right breast in female, estrogen receptor positive ([place]) Staging form: Breast, AJCC 7th Edition - Clinical: Stage IIA (T1, N1, cM0) - Unsigned Specimen type: Core Needle Biopsy Histopathologic type: 9931 Laterality: Right Staging comments: Staged at breast conference 07/24/13.  - Pathologic: Stage IV (M1) - Unsigned Specimen type: Core Needle Biopsy Histopathologic type: 9931 Laterality: Right   SUMMARY OF ONCOLOGIC HISTORY: 82 y.o. 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 It also predicts no benefit from chemotherapy   (3) adjuvant radiation completed 01/07/2014   (4) anastrozole started 02/27/2014 stopped within 2 weeks because of arm swelling.              (a) bone density April 2016 showed osteopenia, with a t-score of -1.6             (b) anastrozole resumed 12/17/2015             (c) Bone density 09/20/2016 showed a T score of -2.2   (5) history of left lower extremity DVT 11/23/2012, initially on rivaroxaban, which caused chest pain, switch to Coumadin July 2014             METASTATIC DISEASE: August 2018 (6) status post left total hip replacement 01/16/2017 for estrogen receptor positive adenocarcinoma.             (a) CA 27-29 was 46.4 as of 04/18/2017             (b) chest CT scan  05/02/2017 shows no lung or liver lesion concern; it does show aortic atherosclerosis             (c) baseline bone scan 05/02/2017 was negative             (d) PET scan 09/12/2017 shows no active disease, including bone             (e) PET scan and CT chest on 05/29/2018 show no active disease             (f) chest CT and bone scan 03/18/2019 showed no evidence of active disease             (g) lumbar spine MRI 01/06/2021 shows multiple compression fractures but no obvious metastases; noncontrast cervical spine and head CT scans 01/04/2021 showed no evidence of neoplastic disease   (7) fulvestrant started 04/18/2017   (8) denosumab/Xgeva started 05/16/2017             (a) changed to every 12-weeks after 10/03/2017 dose              (b) held starting with 06/13/2018 dose due to dental concerns   (9) unprovoked deep vein thrombosis involving the left posterior tibial v documented 11/23/2012, resolved on repeat 06/27/2013 and not recurrent on multiple Dopplers since, most recent 03/06/2018             (  a) on chronic anticoagulation with warfarin given ongoing risks (stage IV breast cancer, relative immobility secondary to claudication  CURRENT THERAPY: Faslodex  INTERVAL HISTORY: Alicia Vasquez 82 y.o. female returns for follow-up of her Faslodex.  She tells me that she has been in a lot of pain in her back and her leg and is taking the oxycodone every 6 hours to help with this.  She receives the Faslodex injections every 4 weeks and tolerates this well.  She has scan scheduled for later this month.   Patient Active Problem List   Diagnosis Date Noted   Poor social situation 09/02/2022   GERD (gastroesophageal reflux disease) 06/17/2021   Bilateral leg edema 06/03/2021   Pressure injury of left buttock, stage 1 06/03/2021   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   Weakness 04/27/2021   Morbid (severe) obesity due to excess calories (HCC) 05/31/2019   Aortic atherosclerosis (HCC) 06/13/2017    Malignant neoplasm metastatic to bone (HCC) 01/25/2017   Endometrial hyperplasia 01/16/2017   Lytic bone lesion of left femur 01/15/2017   Hip fracture, pathological (HCC) 01/15/2017   Lymphedema 09/17/2015   Long term current use of anticoagulant therapy 08/23/2015   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) 06/18/2015   Bilateral knee pain 04/03/2015   Arm edema 08/28/2014   Vitamin D deficiency 04/23/2014   Hyperparathyroidism (HCC) 04/23/2014   Depression 07/18/2013   Malignant neoplasm of upper-outer quadrant of right breast in female, estrogen receptor positive (HCC) 07/15/2013   Overactive bladder 01/30/2013   Primary hypercoagulable state (HCC) 12/19/2012   Essential hypertension 09/27/2011   Hearing loss 09/27/2011   Seasonal allergies 09/27/2011   Hypothyroidism 08/26/2011    is allergic to anesthetics, amide; benadryl [diphenhydramine hcl]; carbocaine [mepivacaine hcl]; codeine; epinephrine; sulfa antibiotics; diphenhydramine; latex; penicillins; and tramadol.  MEDICAL HISTORY: 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    Hypertension    Hypothyroid    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   Personal history of radiation therapy    Pneumonia    PONV (postoperative nausea and vomiting)    Radiation 11/21/13-01/07/14   Right Breast/Supraclavicular    SURGICAL HISTORY: Past Surgical History:  Procedure Laterality Date   BREAST LUMPECTOMY Left 2015   BREAST LUMPECTOMY WITH RADIOACTIVE SEED LOCALIZATION Right 09/09/2013   Procedure: BREAST LUMPECTOMY WITH RADIOACTIVE SEED LOCALIZATION WITH AXILLARY NODE EXCISION;  Surgeon: Emelia Loron, MD;  Location: [facility];  Service: General;  Laterality: Right;   CYSTOSCOPY  W/ URETERAL STENT PLACEMENT Right 01/07/2021   Procedure: CYSTOSCOPY WITH RETROGRADE PYELOGRAM/URETERAL STENT PLACEMENT;  Surgeon: Alfredo Martinez, MD;  Location: [facility];  Service: Urology;  Laterality: Right;   CYSTOSCOPY/URETEROSCOPY/HOLMIUM LASER/STENT PLACEMENT Right 02/08/2021   Procedure: CYSTOSCOPY, RIGHT URETEROSCOPY, RIGHT RETRGRADE PYELOGRAM, HOLMIUM LASER/STENT PLACEMENT;  Surgeon: Crista Elliot, MD;  Location: [facility];  Service: Urology;  Laterality: Right;   DENTAL SURGERY  04/19/2012   13 TEETH REMOVED   DILATION AND CURETTAGE OF UTERUS     IR RADIOLOGIST EVAL & MGMT  01/21/2021   ORIF PERIPROSTHETIC FRACTURE Left 01/31/2017   Procedure: REVISION and OPEN REDUCTION INTERNAL FIXATION (ORIF) PERIPROSTHETIC FRACTURE LEFT HIP;  Surgeon: Durene Romans, MD;  Location: [facility];  Service: Orthopedics;  Laterality: Left;  120 mins   RE-EXCISION OF BREAST LUMPECTOMY Right 09/24/2013   Procedure: RE-EXCISION OF RIGHT BREAST LUMPECTOMY;  Surgeon: Emelia Loron, MD;  Location: [facility];  Service: General;  Laterality: Right;   TOTAL HIP ARTHROPLASTY Left 01/16/2017   Procedure: TOTAL HIP ARTHROPLASTY POSTERIOR;  Surgeon: Durene Romans, MD;  Location: [facility];  Service: Orthopedics;  Laterality: Left;    SOCIAL HISTORY: Social History   Socioeconomic History   Marital status: Widowed    Spouse name: Not on file   Number of children: 1   Years of education: Not on file   Highest education level: Not on file  Occupational History   Not on file  Tobacco Use   Smoking status: Never   Smokeless tobacco: Never  Vaping Use   Vaping Use: Never used  Substance and Sexual Activity   Alcohol use: No    Alcohol/week: 0.0 standard drinks of alcohol   Drug use: No   Sexual activity: Never  Other Topics Concern   Not on file  Social History Narrative   Exercise: yard work.   Social Determinants of Health   Financial Resource Strain: High Risk (07/14/2022)   Overall  Financial Resource Strain (CARDIA)    Difficulty of Paying Living Expenses: Hard  Food Insecurity: No Food Insecurity (07/15/2022)   Hunger Vital Sign    Worried About Running Out of Food in the Last Year: Never true    Ran Out of Food in the Last Year: Never true  Transportation Needs: Unmet Transportation Needs (08/23/2022)   PRAPARE - Administrator, Civil Service (Medical): Yes    Lack of Transportation (Non-Medical): No  Physical Activity: Inactive (09/13/2021)   Exercise Vital Sign    Days of Exercise per Week: 0 days    Minutes of Exercise per Session: 0 min  Stress: No Stress Concern Present (09/13/2021)   [employer] of Occupational Health - Occupational Stress Questionnaire    Feeling of Stress : Only a little  Social Connections: Socially Isolated (09/13/2021)   Social Connection and Isolation Panel [NHANES]    Frequency of Communication with Friends and Family: More than three times a week    Frequency of Social Gatherings with Friends and Family: Three times a week    Attends Religious Services: Never    Active Member of Clubs or Organizations: No    Attends Banker Meetings: Never    Marital Status: Widowed  Intimate Partner Violence: Not At Risk (03/15/2022)   Humiliation, Afraid, Rape, and Kick questionnaire    Fear of Current or Ex-Partner: No    Emotionally Abused: No    Physically Abused: No    Sexually Abused: No    FAMILY HISTORY: Family History  Problem Relation Age of Onset   Heart disease Brother    Colon cancer Brother    Prostate cancer Brother     Review of Systems  Constitutional:  Positive for fatigue. Negative for appetite change, chills, fever and unexpected weight change.  HENT:   Negative for hearing loss, lump/mass and trouble swallowing.   Eyes:  Negative for eye problems and icterus.  Respiratory:  Negative for chest tightness, cough and shortness of breath.   Cardiovascular:  Negative for chest pain, leg  swelling and palpitations.  Gastrointestinal:  Negative for abdominal distention, abdominal pain, constipation, diarrhea, nausea and vomiting.  Endocrine: Negative for hot flashes.  Genitourinary:  Negative for difficulty urinating.   Musculoskeletal:  Positive for  back pain. Negative for arthralgias.  Skin:  Negative for itching and rash.  Neurological:  Negative for dizziness, extremity weakness, headaches and numbness.  Hematological:  Negative for adenopathy. Does not bruise/bleed easily.  Psychiatric/Behavioral:  Negative for depression. The patient is not nervous/anxious.       PHYSICAL EXAMINATION    Vitals:   09/23/22 1117  BP: (!) 153/55  Pulse: 83  Resp: 16  Temp: 97.9 F (36.6 C)  SpO2: 97%    Physical Exam Constitutional:      General: She is not in acute distress.    Appearance: She is ill-appearing (chronically ill appearing examined in wheelchair). She is not toxic-appearing.     Comments: Poor hygiene  HENT:     Head: Normocephalic and atraumatic.  Eyes:     General: No scleral icterus. Cardiovascular:     Rate and Rhythm: Normal rate and regular rhythm.     Pulses: Normal pulses.     Heart sounds: Normal heart sounds.  Pulmonary:     Effort: Pulmonary effort is normal.     Breath sounds: Normal breath sounds.  Abdominal:     General: Abdomen is flat. Bowel sounds are normal.     Palpations: Abdomen is soft.     Comments: Difficult to assess due to body habitus and examined in wheelchair  Musculoskeletal:        General: No swelling.     Cervical back: Neck supple.  Lymphadenopathy:     Cervical: No cervical adenopathy.  Skin:    General: Skin is warm and dry.     Findings: No rash.  Neurological:     General: No focal deficit present.     Mental Status: She is alert.  Psychiatric:        Mood and Affect: Mood normal.        Behavior: Behavior normal.    LABORATORY DATA:  pending  ASSESSMENT and THERAPY PLAN:   Malignant neoplasm of  upper-outer quadrant of right breast in female, estrogen receptor positive (HCC) Alicia Vasquez is an 82 year old woman with history of stage IV breast cancer to the left femur continuing on Faslodex therapy every 4 weeks.  Alicia Vasquez is doing moderately well today.  She continues to experience pain and will continue to see [facility] and palliative care for her oxycodone.  She is tolerating this well.  PMP was reviewed today.  She will continue on Faslodex every 4 weeks.  Because she is a poor historian about her pain and signs of clinical recurrence she will undergo CT chest abdomen pelvis and bone scan every 6 months.  She did not have labs scheduled today.  We will need to obtain these.  We will see her back in 4 weeks for labs, follow-up, and her next injection.   All questions were answered. The patient knows to call the clinic with any problems, questions or concerns. We can certainly see the patient much sooner if necessary.  Total encounter time:20 minutes*in face-to-face visit time, chart review, lab review, care coordination, order entry, and documentation of the encounter time.    Lillard Anes, NP 09/23/22 12:35 PM Medical Oncology and Hematology [facility] [address] [place], [place] [address] Tel. [phone]    Fax. [phone]  *Total Encounter Time as defined by the Centers for Medicare and Medicaid Services includes, in addition to the face-to-face time of a patient visit (documented in the note above) non-face-to-face time: obtaining and reviewing outside history, ordering and reviewing medications,  tests or procedures, care coordination (communications with other health care professionals or caregivers) and documentation in the medical record.

## 2022-09-23 NOTE — Assessment & Plan Note (Signed)
Alicia Vasquez is an 82 year old woman with history of stage IV breast cancer to the left femur continuing on Faslodex therapy every 4 weeks.  Alicia Vasquez is doing moderately well today.  She continues to experience pain and will continue to see [facility] and palliative care for her oxycodone.  She is tolerating this well.  PMP was reviewed today.  She will continue on Faslodex every 4 weeks.  Because she is a poor historian about her pain and signs of clinical recurrence she will undergo CT chest abdomen pelvis and bone scan every 6 months.  She did not have labs scheduled today.  We will need to obtain these.  We will see her back in 4 weeks for labs, follow-up, and her next injection.

## 2022-09-23 NOTE — Patient Instructions (Signed)

## 2022-09-26 ENCOUNTER — Telehealth: Payer: Self-pay

## 2022-09-26 ENCOUNTER — Telehealth: Payer: Self-pay | Admitting: Licensed Clinical Social Worker

## 2022-09-26 DIAGNOSIS — D6859 Other primary thrombophilia: Secondary | ICD-10-CM

## 2022-09-26 NOTE — Telephone Encounter (Signed)
Called Pt with previous message from NP. Pt states she missed dose of coumadin rx yesterday and has difficulty remembering to take it. Made lab appt and put in lab order. Christian Vilsaint verbalized transportation coordination. Pt and Gladys verbalized understanding.  Added NP appt in case NP would like to discuss non-compliance or go over lab result as Pt is difficult to reach via phone.

## 2022-09-26 NOTE — Telephone Encounter (Signed)
-----   Message from Loa Socks, NP sent at 09/26/2022  9:04 AM EDT ----- Recheck INR in 1 week, please make sure she is taking coumadin 6mg  daily.  She had missed a dose, which is why I am not changing her doses at thiys time. ----- Message ----- From: Leory Plowman, [facility] [place] Sent: 09/23/2022   2:05 PM EDT To: Loa Socks, NP

## 2022-09-26 NOTE — Telephone Encounter (Signed)
Called and LVM with below message to Hedda Slade, asked for call back and gave call back number. No voicemail available for Pt's number.

## 2022-09-27 ENCOUNTER — Encounter (HOSPITAL_COMMUNITY): Payer: Medicare Other

## 2022-09-27 ENCOUNTER — Ambulatory Visit (HOSPITAL_COMMUNITY): Payer: Medicare Other

## 2022-09-27 ENCOUNTER — Telehealth: Payer: Self-pay | Admitting: Family Medicine

## 2022-09-27 NOTE — Patient Outreach (Signed)
  Care Coordination   Follow Up Visit Note   09/26/22 Name: Alicia Vasquez [facility] MRN: [id number] DOB: 1940/12/04  Alicia Vasquez is a 82 y.o. year old female who sees Jimmey Ralph, Katina Degree, MD for primary care. I spoke with  Ardith Dark Nadeem by phone today.  What matters to the patients health and wellness today?  Transportation    Goals Addressed             This Visit's Progress    Obtain Stable and Safe Housing   On track    Activities and task to complete in order to accomplish goals.   Schedule transportation through Cancer center for their upcoming appts. Utilize [employer] [place] for additional transportation needs Patient will comply with meds prescribed by doctors Continue working with [facility] to reach identified goals               SDOH assessments and interventions completed:  No     Care Coordination Interventions:  Yes, provided  Interventions Today    Flowsheet Row Most Recent Value  Chronic Disease   Chronic disease during today's visit Hypertension (HTN)  General Interventions   General Interventions Discussed/Reviewed General Interventions Reviewed, [employer] reports barriers to obtaining transportation to scheduled AWV. LCSW spoke with friend and [facility] [place] to obtain transportation and funds for patient to complete appt]  Communication with --  [[facility] [place]]  Mental Health Interventions   Mental Health Discussed/Reviewed Mental Health Reviewed, Coping Strategies  [Pt's support system assessed and strategies discussed to strengthen support system]       Follow up plan: Follow up call scheduled for 1-2 weeks    Encounter Outcome:  Pt. Visit Completed   [name], MSW, LCSW [facility] Care Management [facility]  [facility] [email or web address] Phone [phone] 4:26 PM

## 2022-09-27 NOTE — Telephone Encounter (Signed)
Copied from CRM [id number]. Topic: Medicare AWV >> Sep 27, 2022  8:45 AM Gwenith Spitz wrote: Reason for CRM: Called patient to schedule Medicare Annual Wellness Visit (AWV). Unable to reach patient.  Last date of AWV: 09/13/2021  Please schedule an appointment at any time with Inetta Fermo, [facility]. Please schedule AWVS with Inetta Fermo, [facility] [place].  If any questions, please contact me at [phone].  Thank you ,  Gabriel Cirri [facility] AWV TEAM Direct Dial [phone]

## 2022-09-27 NOTE — Patient Instructions (Signed)
Visit Information  Thank you for taking time to visit with me today. Please don't hesitate to contact me if I can be of assistance to you.   Following are the goals we discussed today:   Goals Addressed             This Visit's Progress    Obtain Stable and Safe Housing   On track    Activities and task to complete in order to accomplish goals.   Schedule transportation through Cancer center for their upcoming appts. Utilize [employer] [place] for additional transportation needs Patient will comply with meds prescribed by doctors Continue working with [facility] to reach identified goals               Our next appointment is by telephone on 5/3 at 10:30 AM  Please call the care guide team at [phone] if you need to cancel or reschedule your appointment.   If you are experiencing a Mental Health or Behavioral Health Crisis or need someone to talk to, please call the Suicide and Crisis Lifeline: 988 call 911   The patient verbalized understanding of instructions, educational materials, and care plan provided today and DECLINED offer to receive copy of patient instructions, educational materials, and care plan.   [name], MSW, LCSW [facility] Care Management [place]  [facility] [email or web address] Phone [phone] 4:26 PM

## 2022-09-29 ENCOUNTER — Encounter: Payer: Self-pay | Admitting: Licensed Clinical Social Worker

## 2022-09-29 ENCOUNTER — Telehealth: Payer: Self-pay | Admitting: Pharmacist

## 2022-09-29 NOTE — Patient Outreach (Signed)
  Care Coordination   Multidisciplinary Case Review Note    09/29/2022 Name: Alicia Vasquez [facility] MRN: [id number] DOB: 1940-07-22  Alicia Vasquez is a 82 y.o. year old female who sees Alicia Vasquez, Alicia Degree, MD for primary care.  The  multidisciplinary care team met today to review patient care needs and barriers.     SDOH assessments and interventions completed:  No     Care Coordination Interventions Activated:  Yes   Care Coordination Interventions:  Yes, provided  Interventions Today    Flowsheet Row Most Recent Value  Chronic Disease   Chronic disease during today's visit Hypertension (HTN)  General Interventions   General Interventions Discussed/Reviewed Communication with  Communication with RN, Social Work  [employer] Multi-Disciplinary Case Review to assist with patient care and strengthening support resources]       Follow up plan: Follow up call scheduled for 1-4 weeks    Multidisciplinary Team Attendees:   Antionette Fairy, [facility] RN Fleeta Emmer, RN [name], LCSW  Scribe for Multidisciplinary Case Review:   [name], MSW, LCSW [facility] Care Management [facility]  [facility] [name] Phone [phone]

## 2022-09-29 NOTE — Progress Notes (Signed)
Care Management & Coordination Services Pharmacy Team  Reason for Encounter: Medication coordination and delivery  Contacted patient to discuss medications and coordinate delivery from [facility]. Unsuccessful outreach. Unable to leave voicemail. Cycle dispensing form sent to [facility] for review.   Last adherence delivery date: 09/09/2022      Patient is due for next adherence delivery on: 10/11/2022  This delivery to include: Vials  30 Days  Lisinopril 10 mg daily Levothyroxine 125 mcg daily Ondansetron 8 mg as needed Oxycodone 5 mg as needed Warfarin 4 mg 1.5 mg daily Pantoprazole 40 mg daily  Patient declined the following medications this month:   No refill request needed.  Delivery scheduled for 10/11/2022. Unable to speak with patient to confirm date.     Chart review: Recent office visits:  09/02/2022 OV (PCP) Ardith Dark, MD; no medication changes indicated.  Recent consult visits:  09/23/2022 OV (Oncology) Pickenpack-Cousar, Athena, N, NP;  Oxycodone 5 mg every 6 hours as needed.  Zofran 8 mg every 8 hours as needed for nausea.  Patient states she generally has nausea at least 2-3 days post Faslodex injection Colace twice daily Tylenol 650mg  three times daily Protonix 40 mg daily   09/23/2022 OV (Oncology) Frankey Shown Cornetto, NP; no medication changes indicated.  Hospital visits:  None in previous 6 months  Medications: Outpatient Encounter Medications as of 09/29/2022  Medication Sig   cholecalciferol (VITAMIN D3) 25 MCG (1000 UNIT) tablet Take 1,000 Units by mouth daily.   levothyroxine (SYNTHROID) 125 MCG tablet Take 1 tablet (125 mcg total) by mouth daily before breakfast.   lisinopril (PRINIVIL) 10 MG tablet Take 1 tablet (10 mg total) by mouth daily.   naloxone (NARCAN) 0.4 MG/ML injection Inject 1 mL (0.4 mg total) into the muscle as needed.   ondansetron (ZOFRAN) 8 MG tablet TAKE ONE TABLET BY MOUTH EVERY 8 HOURS AS NEEDED FOR  NAUSEA AND VOMITING   oxyCODONE (OXY IR/ROXICODONE) 5 MG immediate release tablet Take 1 tablet (5 mg total) by mouth every 6 (six) hours as needed for severe pain.   pantoprazole (PROTONIX) 40 MG tablet Take 1 tablet (40 mg total) by mouth daily before breakfast.   warfarin (COUMADIN) 4 MG tablet Take 1.5 tablets (6 mg total) by mouth daily at 4 PM.   No facility-administered encounter medications on file as of 09/29/2022.   BP Readings from Last 3 Encounters:  09/23/22 (!) 140/62  09/23/22 (!) 153/55  09/02/22 (!) 157/84    Pulse Readings from Last 3 Encounters:  09/23/22 79  09/23/22 83  09/02/22 96    No results found for: "HGBA1C" Lab Results  Component Value Date   CREATININE 1.00 09/23/2022   BUN 15 09/23/2022   GFR 59.13 (L) 12/05/2016   GFRNONAA 56 (L) 09/23/2022   GFRAA >60 08/07/2019   NA 140 09/23/2022   K 4.0 09/23/2022   CALCIUM 11.3 (H) 09/23/2022   CO2 29 09/23/2022     Future Appointments  Date Time Provider Department Center  09/30/2022 10:30 AM [name] D, LCSW [facility] None  10/03/2022  2:15 PM [facility] LAB [facility] None  10/03/2022  2:45 PM Causey, Larna Daughters, NP [facility] None  10/04/2022 12:00 PM Fleeta Emmer, RN [facility] None  10/10/2022  3:30 PM [facility] ANNUAL WELLNESS VISIT 1 [facility] PEC  10/25/2022  1:00 PM Carlus Pavlov, MD [facility] None  10/26/2022 10:30 AM [facility] PALLIATIVE CARE [facility] None  12/30/2022  2:00 PM Ardith Dark, MD [facility]  01/11/2023  3:00 PM Freddie Breech, DPM [facility]   April D Calhoun, [facility] Clinical Pharmacist Assistant [phone]

## 2022-09-30 ENCOUNTER — Ambulatory Visit: Payer: Self-pay | Admitting: Licensed Clinical Social Worker

## 2022-10-03 ENCOUNTER — Encounter: Payer: Self-pay | Admitting: Adult Health

## 2022-10-03 ENCOUNTER — Other Ambulatory Visit: Payer: Self-pay

## 2022-10-03 ENCOUNTER — Inpatient Hospital Stay: Payer: Medicare Other | Attending: Oncology

## 2022-10-03 ENCOUNTER — Telehealth: Payer: Self-pay | Admitting: Licensed Clinical Social Worker

## 2022-10-03 ENCOUNTER — Inpatient Hospital Stay (HOSPITAL_BASED_OUTPATIENT_CLINIC_OR_DEPARTMENT_OTHER): Payer: Medicare Other | Admitting: Adult Health

## 2022-10-03 ENCOUNTER — Inpatient Hospital Stay: Payer: Medicare Other

## 2022-10-03 VITALS — BP 139/43 | HR 81 | Temp 98.1°F | Resp 18 | Ht 64.0 in

## 2022-10-03 DIAGNOSIS — Z5111 Encounter for antineoplastic chemotherapy: Secondary | ICD-10-CM | POA: Insufficient documentation

## 2022-10-03 DIAGNOSIS — Z800 Family history of malignant neoplasm of digestive organs: Secondary | ICD-10-CM | POA: Insufficient documentation

## 2022-10-03 DIAGNOSIS — K5900 Constipation, unspecified: Secondary | ICD-10-CM | POA: Insufficient documentation

## 2022-10-03 DIAGNOSIS — Z170 Estrogen receptor positive status [ER+]: Secondary | ICD-10-CM

## 2022-10-03 DIAGNOSIS — C7951 Secondary malignant neoplasm of bone: Secondary | ICD-10-CM

## 2022-10-03 DIAGNOSIS — Z8042 Family history of malignant neoplasm of prostate: Secondary | ICD-10-CM | POA: Insufficient documentation

## 2022-10-03 DIAGNOSIS — Z923 Personal history of irradiation: Secondary | ICD-10-CM | POA: Insufficient documentation

## 2022-10-03 DIAGNOSIS — Z515 Encounter for palliative care: Secondary | ICD-10-CM

## 2022-10-03 DIAGNOSIS — Z86718 Personal history of other venous thrombosis and embolism: Secondary | ICD-10-CM | POA: Insufficient documentation

## 2022-10-03 DIAGNOSIS — Z5986 Financial insecurity: Secondary | ICD-10-CM | POA: Diagnosis not present

## 2022-10-03 DIAGNOSIS — Z7901 Long term (current) use of anticoagulants: Secondary | ICD-10-CM | POA: Insufficient documentation

## 2022-10-03 DIAGNOSIS — C50411 Malignant neoplasm of upper-outer quadrant of right female breast: Secondary | ICD-10-CM | POA: Insufficient documentation

## 2022-10-03 DIAGNOSIS — R0789 Other chest pain: Secondary | ICD-10-CM | POA: Diagnosis not present

## 2022-10-03 DIAGNOSIS — M8580 Other specified disorders of bone density and structure, unspecified site: Secondary | ICD-10-CM | POA: Insufficient documentation

## 2022-10-03 DIAGNOSIS — G893 Neoplasm related pain (acute) (chronic): Secondary | ICD-10-CM | POA: Insufficient documentation

## 2022-10-03 DIAGNOSIS — R918 Other nonspecific abnormal finding of lung field: Secondary | ICD-10-CM | POA: Insufficient documentation

## 2022-10-03 DIAGNOSIS — M84459A Pathological fracture, hip, unspecified, initial encounter for fracture: Secondary | ICD-10-CM

## 2022-10-03 DIAGNOSIS — D6859 Other primary thrombophilia: Secondary | ICD-10-CM

## 2022-10-03 DIAGNOSIS — Z5941 Food insecurity: Secondary | ICD-10-CM | POA: Diagnosis not present

## 2022-10-03 DIAGNOSIS — Z96642 Presence of left artificial hip joint: Secondary | ICD-10-CM | POA: Diagnosis not present

## 2022-10-03 LAB — CMP (CANCER CENTER ONLY)
ALT: 10 U/L (ref 0–44)
AST: 14 U/L — ABNORMAL LOW (ref 15–41)
Albumin: 4 g/dL (ref 3.5–5.0)
Alkaline Phosphatase: 138 U/L — ABNORMAL HIGH (ref 38–126)
Anion gap: 6 (ref 5–15)
BUN: 16 mg/dL (ref 8–23)
CO2: 27 mmol/L (ref 22–32)
Calcium: 10.8 mg/dL — ABNORMAL HIGH (ref 8.9–10.3)
Chloride: 105 mmol/L (ref 98–111)
Creatinine: 1.02 mg/dL — ABNORMAL HIGH (ref 0.44–1.00)
GFR, Estimated: 55 mL/min — ABNORMAL LOW (ref >60)
Glucose, Bld: 96 mg/dL (ref 70–99)
Potassium: 4.3 mmol/L (ref 3.5–5.1)
Sodium: 138 mmol/L (ref 135–145)
Total Bilirubin: 0.4 mg/dL (ref 0.3–1.2)
Total Protein: 6.8 g/dL (ref 6.5–8.1)

## 2022-10-03 LAB — CBC WITH DIFFERENTIAL (CANCER CENTER ONLY)
Abs Immature Granulocytes: 0.03 10*3/uL (ref 0.00–0.07)
Basophils Absolute: 0.1 10*3/uL (ref 0.0–0.1)
Basophils Relative: 1 %
Eosinophils Absolute: 0.4 10*3/uL (ref 0.0–0.5)
Eosinophils Relative: 6 %
HCT: 39.8 % (ref 36.0–46.0)
Hemoglobin: 12.7 g/dL (ref 12.0–15.0)
Immature Granulocytes: 1 %
Lymphocytes Relative: 24 %
Lymphs Abs: 1.6 10*3/uL (ref 0.7–4.0)
MCH: 28.3 pg (ref 26.0–34.0)
MCHC: 31.9 g/dL (ref 30.0–36.0)
MCV: 88.8 fL (ref 80.0–100.0)
Monocytes Absolute: 1 10*3/uL (ref 0.1–1.0)
Monocytes Relative: 14 %
Neutro Abs: 3.6 10*3/uL (ref 1.7–7.7)
Neutrophils Relative %: 54 %
Platelet Count: 287 10*3/uL (ref 150–400)
RBC: 4.48 MIL/uL (ref 3.87–5.11)
RDW: 15.2 % (ref 11.5–15.5)
WBC Count: 6.7 10*3/uL (ref 4.0–10.5)
nRBC: 0 % (ref 0.0–0.2)

## 2022-10-03 LAB — PROTIME-INR
INR: 1.3 — ABNORMAL HIGH (ref 0.8–1.2)
Prothrombin Time: 16.1 seconds — ABNORMAL HIGH (ref 11.4–15.2)

## 2022-10-03 MED ORDER — OXYCODONE HCL 5 MG PO TABS
5.0000 mg | ORAL_TABLET | Freq: Four times a day (QID) | ORAL | 0 refills | Status: DC | PRN
Start: 2022-10-03 — End: 2022-10-26

## 2022-10-03 NOTE — Assessment & Plan Note (Addendum)
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  She has continued on fulvestrant given every 4 weeks.    Alicia Vasquez continues on fulvestrant given every 4 weeks.  This is due again in 3 weeks which I requested scheduling of.  She will continue this therapy.  I recommended that she stop taking Coumadin.  Her living situation is not conducive to her being on a blood thinner.  She is alone for hours at a time.  She has dietary intake that is inconsistent, and she is unable to take her medications regularly.  And her mobility issues she is more at risk for having an adverse event from a blood thinner.  Dr. Al Pimple and I discussed this and we are both in agreement about this plan.   I sent in a refill of her oxycodone No. 60 to her pharmacy.  I am very concerned about her living situation.  Her living condition is l deplorable considering her needs. She met with social work and the guardian who have a plan to get Alicia Vasquez with her primary care provider for her FL 2 paperwork in order to get her placed with assisted living.  Since Alicia Vasquez had not eaten today we have left over food from nurses suite that we provided further her to eat while in clinic today.  We will see Alicia Vasquez back in 3 weeks for labs, f/u, and her next injection.

## 2022-10-03 NOTE — Progress Notes (Signed)
[facility] Cancer Follow up:    Alicia Dark, MD [address] [place] [address]   DIAGNOSIS:  Cancer Staging  Malignant neoplasm of upper-outer quadrant of right breast in female, estrogen receptor positive (HCC) Staging form: Breast, AJCC 7th Edition - Clinical: Stage IIA (T1, N1, cM0) - Unsigned Specimen type: Core Needle Biopsy Histopathologic type: 9931 Laterality: Right Staging comments: Staged at breast conference 07/24/13.  - Pathologic: Stage IV (M1) - Unsigned Specimen type: Core Needle Biopsy Histopathologic type: 9931 Laterality: Right   SUMMARY OF ONCOLOGIC HISTORY: 82 y.o. Alicia Vasquez woman status post right breast upper outer quadrant lumpectomy and sentinel lymph node sampling 09/09/2013 for an mpT1c pN1a, stage IIA invasive ductal carcinoma, estrogen and progesterone receptor both 100% positive with strong staining intensity, MIB-1 of 17% and no HER-2 amplification   (1) additional surgery for margin clearance 09/16/2013 obtained negative margins   (2) Oncotype DX recurrence score of 4 predicts a risk of outside the breast recurrence within 10 years of 7% if the patient's only systemic therapy is tamoxifen for 5 years. It also predicts no benefit from chemotherapy   (3) adjuvant radiation completed 01/07/2014   (4) anastrozole started 02/27/2014 stopped within 2 weeks because of arm swelling.              (a) bone density April 2016 showed osteopenia, with a t-score of -1.6             (b) anastrozole resumed 12/17/2015             (c) Bone density 09/20/2016 showed a T score of -2.2   (5) history of left lower extremity DVT 11/23/2012, initially on rivaroxaban, which caused chest pain, switch to Coumadin July 2014             METASTATIC DISEASE: August 2018 (6) status post left total hip replacement 01/16/2017 for estrogen receptor positive adenocarcinoma.             (a) CA 27-29 was 46.4 as of 04/18/2017             (b) chest CT scan  05/02/2017 shows no lung or liver lesion concern; it does show aortic atherosclerosis             (c) baseline bone scan 05/02/2017 was negative             (d) PET scan 09/12/2017 shows no active disease, including bone             (e) PET scan and CT chest on 05/29/2018 show no active disease             (f) chest CT and bone scan 03/18/2019 showed no evidence of active disease             (g) lumbar spine MRI 01/06/2021 shows multiple compression fractures but no obvious metastases; noncontrast cervical spine and head CT scans 01/04/2021 showed no evidence of neoplastic disease   (7) fulvestrant started 04/18/2017   (8) denosumab/Xgeva started 05/16/2017             (a) changed to every 12-weeks after 10/03/2017 dose              (b) held starting with 06/13/2018 dose due to dental concerns   (9) unprovoked deep vein thrombosis involving the left posterior tibial v documented 11/23/2012, resolved on repeat 06/27/2013 and not recurrent on multiple Dopplers since, most recent 03/06/2018             (  a) on chronic anticoagulation with warfarin given ongoing risks (stage IV breast cancer, relative immobility secondary to claudication  CURRENT THERAPY: Faslodex  INTERVAL HISTORY: Alicia Vasquez 82 y.o. female returns for f/u to discuss her missing doses of Coumadin.  My nurse grew concerned after talking to her on the phone and set up an appointment with her to discuss after we saw her last week.  A guardian from Adult Protective Services accompanied Alicia Vasquez to her appointment today.  She has been visiting Alicia Vasquez at the place where she is staying in a hotel.    Alicia Vasquez tearfully told me that she continues to struggle with her living situation.  She notes that adult social services have brought her food and money for groceries and her son will eat all the food.  She also has limited mobility and requires assistance with her activities of daily living.  In her current living situation she does not have  regular assistance from her son who is living with her.  Her friend Alicia Vasquez and her friend Alicia Vasquez will come by periodically but this is not regular and they help with bathing and cooking. She does not have regular assistance with meal preparation and cannot get her own food.  Sometimes she misses meals due to this.  .  Today in the clinic Alicia Vasquez tells me that she has not had anything to eat all day.  There is no food at her home.  Her guardian has been visiting her and she brought in videos of Alicia Vasquez's living situation which revealed a very dirty and unclean environment.     Patient Active Problem List   Diagnosis Date Noted   Poor social situation 09/02/2022   GERD (gastroesophageal reflux disease) 06/17/2021   Bilateral leg edema 06/03/2021   Pressure injury of left buttock, stage 1 06/03/2021   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   Weakness 04/27/2021   Morbid (severe) obesity due to excess calories (HCC) 05/31/2019   Aortic atherosclerosis (HCC) 06/13/2017   Malignant neoplasm metastatic to bone (HCC) 01/25/2017   Endometrial hyperplasia 01/16/2017   Lytic bone lesion of left femur 01/15/2017   Hip fracture, pathological (HCC) 01/15/2017   Lymphedema 09/17/2015   Long term current use of anticoagulant therapy 08/23/2015   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) 06/18/2015   Bilateral knee pain 04/03/2015   Arm edema 08/28/2014   Vitamin D deficiency 04/23/2014   Hyperparathyroidism (HCC) 04/23/2014   Depression 07/18/2013   Malignant neoplasm of upper-outer quadrant of right breast in female, estrogen receptor positive (HCC) 07/15/2013   Overactive bladder 01/30/2013   Primary hypercoagulable state (HCC) 12/19/2012   Essential hypertension 09/27/2011   Hearing loss 09/27/2011   Seasonal allergies 09/27/2011   Hypothyroidism 08/26/2011    is allergic to anesthetics, amide; benadryl [diphenhydramine hcl]; carbocaine [mepivacaine hcl]; codeine;  epinephrine; sulfa antibiotics; diphenhydramine; latex; penicillins; and tramadol.  MEDICAL HISTORY: 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    Hypertension    Hypothyroid    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   Personal history of radiation therapy    Pneumonia    PONV (postoperative nausea and vomiting)    Radiation 11/21/13-01/07/14   Right Breast/Supraclavicular    SURGICAL HISTORY: Past Surgical  History:  Procedure Laterality Date   BREAST LUMPECTOMY Left 2015   BREAST LUMPECTOMY WITH RADIOACTIVE SEED LOCALIZATION Right 09/09/2013   Procedure: BREAST LUMPECTOMY WITH RADIOACTIVE SEED LOCALIZATION WITH AXILLARY NODE EXCISION;  Surgeon: Emelia Loron, MD;  Location: [facility];  Service: General;  Laterality: Right;   CYSTOSCOPY W/ URETERAL STENT PLACEMENT Right 01/07/2021   Procedure: CYSTOSCOPY WITH RETROGRADE PYELOGRAM/URETERAL STENT PLACEMENT;  Surgeon: Alfredo Martinez, MD;  Location: [facility];  Service: Urology;  Laterality: Right;   CYSTOSCOPY/URETEROSCOPY/HOLMIUM LASER/STENT PLACEMENT Right 02/08/2021   Procedure: CYSTOSCOPY, RIGHT URETEROSCOPY, RIGHT RETRGRADE PYELOGRAM, HOLMIUM LASER/STENT PLACEMENT;  Surgeon: Crista Elliot, MD;  Location: [facility];  Service: Urology;  Laterality: Right;   DENTAL SURGERY  04/19/2012   13 TEETH REMOVED   DILATION AND CURETTAGE OF UTERUS     IR RADIOLOGIST EVAL & MGMT  01/21/2021   ORIF PERIPROSTHETIC FRACTURE Left 01/31/2017   Procedure: REVISION and OPEN REDUCTION INTERNAL FIXATION (ORIF) PERIPROSTHETIC FRACTURE LEFT HIP;  Surgeon: Durene Romans, MD;  Location: [facility];  Service: Orthopedics;  Laterality: Left;  120 mins   RE-EXCISION OF BREAST LUMPECTOMY Right 09/24/2013   Procedure: RE-EXCISION OF RIGHT BREAST LUMPECTOMY;   Surgeon: Emelia Loron, MD;  Location: [facility];  Service: General;  Laterality: Right;   TOTAL HIP ARTHROPLASTY Left 01/16/2017   Procedure: TOTAL HIP ARTHROPLASTY POSTERIOR;  Surgeon: Durene Romans, MD;  Location: [facility];  Service: Orthopedics;  Laterality: Left;    SOCIAL HISTORY: Social History   Socioeconomic History   Marital status: Widowed    Spouse name: Not on file   Number of children: 1   Years of education: Not on file   Highest education level: Not on file  Occupational History   Not on file  Tobacco Use   Smoking status: Never   Smokeless tobacco: Never  Vaping Use   Vaping Use: Never used  Substance and Sexual Activity   Alcohol use: No    Alcohol/week: 0.0 standard drinks of alcohol   Drug use: No   Sexual activity: Never  Other Topics Concern   Not on file  Social History Narrative   Exercise: yard work.   Social Determinants of Health   Financial Resource Strain: High Risk (07/14/2022)   Overall Financial Resource Strain (CARDIA)    Difficulty of Paying Living Expenses: Hard  Food Insecurity: Food Insecurity Present (10/03/2022)   Hunger Vital Sign    Worried About Running Out of Food in the Last Year: Often true    Ran Out of Food in the Last Year: Often true  Transportation Needs: Unmet Transportation Needs (08/23/2022)   PRAPARE - Administrator, Civil Service (Medical): Yes    Lack of Transportation (Non-Medical): No  Physical Activity: Inactive (09/13/2021)   Exercise Vital Sign    Days of Exercise per Week: 0 days    Minutes of Exercise per Session: 0 min  Stress: Stress Concern Present (10/03/2022)   [employer] of Occupational Health - Occupational Stress Questionnaire    Feeling of Stress : Very much  Social Connections: Socially Isolated (09/13/2021)   Social Connection and Isolation Panel [NHANES]    Frequency of Communication with Friends and Family: More than three times a week    Frequency of Social  Gatherings with Friends and Family: Three times a week    Attends Religious Services: Never    Active Member of Clubs or Organizations: No    Attends Banker Meetings: Never  Marital Status: Widowed  Intimate Partner Violence: Not At Risk (03/15/2022)   Humiliation, Afraid, Rape, and Kick questionnaire    Fear of Current or Ex-Partner: No    Emotionally Abused: No    Physically Abused: No    Sexually Abused: No    FAMILY HISTORY: Family History  Problem Relation Age of Onset   Heart disease Brother    Colon cancer Brother    Prostate cancer Brother     Review of Systems  Constitutional:  Positive for fatigue. Negative for appetite change, chills, fever and unexpected weight change.  HENT:   Negative for hearing loss, lump/mass and trouble swallowing.   Eyes:  Negative for eye problems and icterus.  Respiratory:  Negative for chest tightness, cough and shortness of breath.   Cardiovascular:  Negative for chest pain, leg swelling and palpitations.  Gastrointestinal:  Negative for abdominal distention, abdominal pain, constipation, diarrhea, nausea and vomiting.  Endocrine: Negative for hot flashes.  Genitourinary:  Negative for difficulty urinating.   Musculoskeletal:  Positive for arthralgias (left hip pain).  Skin:  Negative for itching and rash.  Neurological:  Negative for dizziness, extremity weakness, headaches and numbness.  Hematological:  Negative for adenopathy. Does not bruise/bleed easily.  Psychiatric/Behavioral:  Positive for depression. Negative for suicidal ideas. The patient is nervous/anxious.       PHYSICAL EXAMINATION    Vitals:   10/03/22 1508  BP: (!) 139/43  Pulse: 81  Resp: 18  Temp: 98.1 F (36.7 C)  SpO2: 99%   Exam deferred in lieu of patient counseling.      LABORATORY DATA:  CBC    Component Value Date/Time   WBC 6.7 10/03/2022 1444   WBC 9.7 06/28/2022 1223   RBC 4.48 10/03/2022 1444   HGB 12.7 10/03/2022 1444    HGB 11.6 05/16/2017 1417   HCT 39.8 10/03/2022 1444   HCT 36.7 05/16/2017 1417   PLT 287 10/03/2022 1444   PLT 323 05/16/2017 1417   MCV 88.8 10/03/2022 1444   MCV 80.8 05/16/2017 1417   MCH 28.3 10/03/2022 1444   MCHC 31.9 10/03/2022 1444   RDW 15.2 10/03/2022 1444   RDW 16.8 (H) 05/16/2017 1417   LYMPHSABS 1.6 10/03/2022 1444   LYMPHSABS 1.1 05/16/2017 1417   MONOABS 1.0 10/03/2022 1444   MONOABS 0.8 05/16/2017 1417   EOSABS 0.4 10/03/2022 1444   EOSABS 0.2 05/16/2017 1417   BASOSABS 0.1 10/03/2022 1444   BASOSABS 0.1 05/16/2017 1417    CMP     Component Value Date/Time   NA 138 10/03/2022 1444   NA 141 05/16/2017 1416   K 4.3 10/03/2022 1444   K 3.8 05/16/2017 1416   CL 105 10/03/2022 1444   CO2 27 10/03/2022 1444   CO2 27 05/16/2017 1416   GLUCOSE 96 10/03/2022 1444   GLUCOSE 140 05/16/2017 1416   BUN 16 10/03/2022 1444   BUN 13.2 05/16/2017 1416   CREATININE 1.02 (H) 10/03/2022 1444   CREATININE 0.80 09/22/2017 1555   CREATININE 0.8 05/16/2017 1416   CALCIUM 10.8 (H) 10/03/2022 1444   CALCIUM 11.0 (H) 05/16/2017 1416   PROT 6.8 10/03/2022 1444   PROT 6.9 05/16/2017 1416   ALBUMIN 4.0 10/03/2022 1444   ALBUMIN 3.6 05/16/2017 1416   AST 14 (L) 10/03/2022 1444   AST 13 05/16/2017 1416   ALT 10 10/03/2022 1444   ALT <6 05/16/2017 1416   ALKPHOS 138 (H) 10/03/2022 1444   ALKPHOS 130 05/16/2017 1416   BILITOT 0.4  10/03/2022 1444   BILITOT 0.31 05/16/2017 1416   GFRNONAA 55 (L) 10/03/2022 1444   GFRNONAA 75 08/19/2015 1602   GFRAA >60 08/07/2019 1305   GFRAA >60 03/21/2018 1417   GFRAA 87 08/19/2015 1602        ASSESSMENT and THERAPY PLAN:   Malignant neoplasm metastatic to bone (HCC) Alicia Vasquez is an 82 year old woman with 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  She has continued on fulvestrant given every 4 weeks.    Alicia Vasquez continues on fulvestrant given every 4 weeks.  This is due again in 3 weeks which I requested scheduling of.  She  will continue this therapy.  I recommended that she stop taking Coumadin.  Her living situation is not conducive to her being on a blood thinner.  She is alone for hours at a time.  She has dietary intake that is inconsistent, and she is unable to take her medications regularly.  And her mobility issues she is more at risk for having an adverse event from a blood thinner.  Dr. Al Pimple and I discussed this and we are both in agreement about this plan.   I sent in a refill of her oxycodone No. 60 to her pharmacy.  I am very concerned about her living situation.  Her living condition is l deplorable considering her needs. She met with social work and the guardian who have a plan to get Alicia Vasquez with her primary care provider for her FL 2 paperwork in order to get her placed with assisted living.  Since Adline had not eaten today we have left over food from nurses suite that we provided further her to eat while in clinic today.  We will see Marializ back in 3 weeks for labs, f/u, and her next injection.     All questions were answered. The patient knows to call the clinic with any problems, questions or concerns. We can certainly see the patient much sooner if necessary.  Total encounter time:45 minutes*in face-to-face visit time, chart review, lab review, care coordination, order entry, and documentation of the encounter time.    Lillard Anes, NP 10/03/22 5:44 PM Medical Oncology and Hematology [facility] [address] [place], [place] [address] Tel. [phone]    Fax. [phone]  *Total Encounter Time as defined by the Centers for Medicare and Medicaid Services includes, in addition to the face-to-face time of a patient visit (documented in the note above) non-face-to-face time: obtaining and reviewing outside history, ordering and reviewing medications, tests or procedures, care coordination (communications with other health care professionals or caregivers) and documentation  in the medical record.

## 2022-10-03 NOTE — Progress Notes (Signed)
[facility] CSW Progress Note  Visual merchandiser met with patient and her [employer] [place], [phone], per the request of medical provider.  Patient agreed to transfer to an assisted living from the hotel she is currently staying at.  Provided education on the process.  Patient does not qualify for Medicaid due to her social security retirement income.  CSW will contact the [place], CSW, at [facility] to further discuss placement and FL-2.  Also consulted Tempie Donning, LCSW, who has worked with patient.  Provided a bag of food from the food pantry.  No other needs at this time.    Dorothey Baseman, LCSW Clinical Social Worker [facility]

## 2022-10-03 NOTE — Patient Instructions (Signed)
Visit Information  Thank you for taking time to visit with me today. Please don't hesitate to contact me if I can be of assistance to you.   Following are the goals we discussed today:   Goals Addressed             This Visit's Progress    Obtain Stable and Safe Housing   On track    Activities and task to complete in order to accomplish goals.   Schedule transportation through Cancer center for their upcoming appts. Utilize [employer] [place] for additional transportation needs Patient will comply with meds prescribed by doctors Continue working with [facility] to reach identified goals               Please call the care guide team at [phone] if you need to cancel or reschedule your appointment.   If you are experiencing a Mental Health or Behavioral Health Crisis or need someone to talk to, please call the Suicide and Crisis Lifeline: 988 call 911   The patient verbalized understanding of instructions, educational materials, and care plan provided today and DECLINED offer to receive copy of patient instructions, educational materials, and care plan.   [name], MSW, LCSW [facility] Care Management [place]  [email or web address] Phone [phone] 2:23 PM

## 2022-10-03 NOTE — Patient Outreach (Signed)
  Care Coordination   Follow Up Visit Note   09/30/2022 Name: Alicia Vasquez [facility] MRN: [id number] DOB: [birth year]  Alicia Vasquez is a 82 y.o. year old female who sees Jimmey Ralph, Katina Degree, MD for primary care. I spoke with  Alicia Vasquez's friend, Venita Sheffield, by phone today.  What matters to the patients health and wellness today?  Community Resources    Goals Addressed             This Visit's Progress    Obtain Stable and Safe Housing   On track    Activities and task to complete in order to accomplish goals.   Schedule transportation through Cancer center for their upcoming appts. Utilize [employer] [place] for additional transportation needs Patient will comply with meds prescribed by doctors Continue working with [facility] to reach identified goals               SDOH assessments and interventions completed:  No     Care Coordination Interventions:  Yes, provided  Interventions Today    Flowsheet Row Most Recent Value  Chronic Disease   Chronic disease during today's visit Hypertension (HTN)  General Interventions   General Interventions Discussed/Reviewed General Interventions Reviewed, [facility]  [Pt's phone is off due to financial strain. Pt is working with community agency to assist with placement in Assisted Living.]  Mental Health Interventions   Mental Health Discussed/Reviewed Mental Health Reviewed, Coping Strategies  [Pt is strengthening support system to assist with psychosocial stressors]  Safety Interventions   Safety Discussed/Reviewed Safety Reviewed       Follow up plan: Follow up call scheduled for 1-2 weeks    Encounter Outcome:  Pt. Visit Completed   [name], MSW, LCSW [facility] Care Management [facility]  [email or web address] Phone [phone] 2:23 PM

## 2022-10-04 ENCOUNTER — Ambulatory Visit: Payer: Self-pay

## 2022-10-04 NOTE — Patient Outreach (Signed)
  Care Coordination   Follow Up Visit Note   10/04/2022 Name: Alicia [place] [facility] MRN: [id number] DOB: [birth year]  Alicia Vasquez is a 82 y.o. year old female who sees Jimmey Ralph, Katina Degree, MD for primary care. I spoke with  Alicia Vasquez by phone today.  What matters to the patients health and wellness today?  Getting into assisted living    Goals Addressed             This Visit's Progress    Pain control/ getting into possible assisted living       Patient Goals/Self Care Activities: -Patient/Caregiver will self-administer medications as prescribed as evidenced by self-report/primary caregiver report  -Patient/Caregiver will attend all scheduled provider appointments as evidenced by clinician review of documented attendance to scheduled appointments and patient/caregiver report -Patient/Caregiver will call provider office for new concerns or questions as evidenced by review of documented incoming telephone call notes and patient report   Patient reports that account is no longer frozen and that her friend's daughter [name] has access to account and that [name] is working with patient advocate to get patient placed as son is leaving her for days at a time now.  Friends Venita Sheffield and [name] are visiting frequently with patient to help with care until she can get to an assisted living facility.   Patient went to cancer center for visit on yesterday.  Oncology  aware of patient social situation and plans to contact physician for FL-2 at this time for assisted living.     Pain controlled when patient takes her oxycodone.  Patient reports taking medication 4 times a day. Reiterated importance of pain control.            SDOH assessments and interventions completed:  Yes     Care Coordination Interventions:  Yes, provided   Follow up plan: Follow up call scheduled for next week.    Encounter Outcome:  Pt. Visit Completed   Bary Leriche, RN, MSN [facility] Care Management Care  Management Coordinator Direct Line [phone]

## 2022-10-04 NOTE — Patient Outreach (Signed)
  Care Coordination   Follow Up Visit Note   10/03/22 Name: Rainy Ruiz [facility] MRN: [id number] DOB: 1941/01/14  Alicia Vasquez is a 82 y.o. year old female who sees Jimmey Ralph, Katina Degree, MD for primary care. I  spoke with Eliseo Squires  What matters to the patients health and wellness today?  Transportation    Goals Addressed             This Visit's Progress    Obtain Stable and Safe Housing   On track    Activities and task to complete in order to accomplish goals.   Schedule transportation through Cancer center for their upcoming appts. Utilize [employer] [place] for additional transportation needs Patient will comply with meds prescribed by doctors Continue working with [facility] to reach identified goals               SDOH assessments and interventions completed:  No     Care Coordination Interventions:  Yes, provided  Interventions Today    Flowsheet Row Most Recent Value  Chronic Disease   Chronic disease during today's visit Hypertension (HTN)  General Interventions   General Interventions Discussed/Reviewed [employer] requests assistance with obtaining transportation to upcoming appt]       Follow up plan: Follow up call scheduled for 1 week    Encounter Outcome:  Pt. Visit Completed   [name], MSW, LCSW [facility] Care Management [facility]  [facility] [email or web address] Phone [phone] 6:06 PM

## 2022-10-04 NOTE — Patient Instructions (Signed)
Visit Information  Thank you for taking time to visit with me today. Please don't hesitate to contact me if I can be of assistance to you.   Following are the goals we discussed today:   Goals Addressed             This Visit's Progress    Pain control/ getting into possible assisted living       Patient Goals/Self Care Activities: -Patient/Caregiver will self-administer medications as prescribed as evidenced by self-report/primary caregiver report  -Patient/Caregiver will attend all scheduled provider appointments as evidenced by clinician review of documented attendance to scheduled appointments and patient/caregiver report -Patient/Caregiver will call provider office for new concerns or questions as evidenced by review of documented incoming telephone call notes and patient report   Patient reports that account is no longer frozen and that her friend's daughter [name] has access to account and that [name] is working with patient advocate to get patient placed as son is leaving her for days at a time now.  Friends Venita Sheffield and [name] are visiting frequently with patient to help with care until she can get to an assisted living facility.   Patient went to cancer center for visit on yesterday.  Oncology  aware of patient social situation and plans to contact physician for FL-2 at this time for assisted living.     Pain controlled when patient takes her oxycodone.  Patient reports taking medication 4 times a day. Reiterated importance of pain control.            Our next appointment is by telephone on 10/13/22 at 1130  Please call the care guide team at [phone] if you need to cancel or reschedule your appointment.   If you are experiencing a Mental Health or Behavioral Health Crisis or need someone to talk to, please call the Suicide and Crisis Lifeline: 988   Patient verbalizes understanding of instructions and care plan provided today and agrees to view in MyChart. Active MyChart  status and patient understanding of how to access instructions and care plan via MyChart confirmed with patient.     The patient has been provided with contact information for the care management team and has been advised to call with any health related questions or concerns.   Bary Leriche, RN, MSN [facility] Care Management Care Management Coordinator Direct Line [phone]

## 2022-10-05 ENCOUNTER — Encounter: Payer: Self-pay | Admitting: Licensed Clinical Social Worker

## 2022-10-06 ENCOUNTER — Telehealth: Payer: Self-pay | Admitting: Licensed Clinical Social Worker

## 2022-10-06 NOTE — Patient Outreach (Signed)
  Care Coordination   Follow Up Visit Note   10/05/22 Name: Navea Secundino [facility] MRN: [id number] DOB: 05-Feb-1941  Taunya Bigman [place] is a 82 y.o. year old female who sees Jimmey Ralph, Katina Degree, MD for primary care. I  spoke with [employer] [place]  What matters to the patients health and wellness today?  Patient was not engaged during this encounter   SDOH assessments and interventions completed:  No     Care Coordination Interventions:  Yes, provided  Interventions Today    Flowsheet Row Most Recent Value  General Interventions   General Interventions Discussed/Reviewed [employer], Communication with  [LCSW scheduled [employer] [place] transportation for patient regarding upcoming appt]  Communication with RN       Follow up plan: Follow up call scheduled for by 10/07/22    Encounter Outcome:  Pt. Visit Completed   [name], MSW, LCSW [facility] Care Management [facility]  [facility] [email or web address] Phone [phone] 3:58 PM

## 2022-10-06 NOTE — Patient Outreach (Signed)
  Care Coordination   Follow Up Visit Note   10/06/2022 Name: Alicia Vasquez MRN: [id number] DOB: 07-27-1940  Alicia Vasquez is a 82 y.o. year old female who sees Alicia Vasquez, Alicia Degree, MD for primary care. I spoke with  Alicia Vasquez by phone today.  What matters to the patients health and wellness today?  Transportation    Goals Addressed             This Visit's Progress    Obtain Stable and Safe Housing   On track    Activities and task to complete in order to accomplish goals.   Schedule transportation through [facility] for their upcoming appts. Utilize [employer] [place] for additional transportation needs Patient will comply with meds prescribed by doctors Continue working with Alicia Vasquez to reach identified goals               SDOH assessments and interventions completed:  No     Care Coordination Interventions:  Yes, provided  Interventions Today    Flowsheet Row Most Recent Value  General Interventions   General Interventions Discussed/Reviewed General Interventions Reviewed  [LCSW informed pt of scheduled transportation through [employer] [place] to PCP appt Monday 5/13]  Pharmacy Interventions   Pharmacy Dicussed/Reviewed Pharmacy Topics Reviewed  [Patient agreed to contact pharmacy for med refills. Will discuss with PCP any questions or concerns at upcoming appt]       Follow up plan: Follow up call scheduled for 1 week    Encounter Outcome:  Pt. Visit Completed   [name], MSW, LCSW [facility] Care Management [facility]  [facility] [email or web address] Phone [phone] 10:51 PM

## 2022-10-07 ENCOUNTER — Telehealth: Payer: Self-pay | Admitting: Family Medicine

## 2022-10-07 ENCOUNTER — Inpatient Hospital Stay: Payer: Medicare Other

## 2022-10-07 ENCOUNTER — Encounter: Payer: Self-pay | Admitting: Licensed Clinical Social Worker

## 2022-10-07 NOTE — Telephone Encounter (Signed)
Caller states she is calling from cancer center. States she was trying to get pcp to fill out and sign an FL2 form so patient can be moved into assisted living facility. For more information, please contact Larita Fife @ [phone].

## 2022-10-07 NOTE — Progress Notes (Signed)
[facility] CSW Progress Note  Visual merchandiser communicated with [facility] CSW, [name], and confirmed that patient previously refused placement and an FL2 was not initiated.  Spoke with Havlyn at Dr. Lavone Neri office and requested an [facility] since patient now agrees to placement in assisted living.  Also gave Leavy Cella the contact information for Ivar Drape of [employer] [phone]) who has agreed to assist with placement.    Dorothey Baseman, LCSW Clinical Social Worker [facility]

## 2022-10-07 NOTE — Patient Outreach (Signed)
  Care Coordination   Follow Up/Collab Visit Note   10/07/2022 Name: Alicia Vasquez [facility] MRN: [id number] DOB: [birth year]  Alicia Vasquez is a 82 y.o. year old female who sees Jimmey Ralph, Katina Degree, MD for primary care. I  collaborated with PCP office and LCSW [place]  What matters to the patients health and wellness today?  Assistance applying for Medicaid. Patient was not engaged during this encounter     SDOH assessments and interventions completed:  No     Care Coordination Interventions:  Yes, provided  Interventions Today    Flowsheet Row Most Recent Value  Chronic Disease   Chronic disease during today's visit Hypertension (HTN)  General Interventions   General Interventions Discussed/Reviewed Communication with  Communication with Social Work  [employer] collaborated with PCP office and LCSW [employer] Duffy. Pt would like assistance applying for Medicaid. She is working with [employer]. Her number is [phone].]       Follow up plan: Follow up call scheduled for 1-2 weeks    Encounter Outcome:  Pt. Visit Completed   [name], MSW, LCSW [facility] Care Management [facility]  [facility] [email or web address] Phone [phone] 6:42 PM

## 2022-10-10 ENCOUNTER — Ambulatory Visit (INDEPENDENT_AMBULATORY_CARE_PROVIDER_SITE_OTHER): Payer: Medicare Other

## 2022-10-10 ENCOUNTER — Other Ambulatory Visit: Payer: Self-pay | Admitting: Adult Health

## 2022-10-10 ENCOUNTER — Telehealth: Payer: Self-pay | Admitting: Licensed Clinical Social Worker

## 2022-10-10 VITALS — BP 122/64 | HR 87 | Temp 98.0°F | Wt 208.0 lb

## 2022-10-10 DIAGNOSIS — M84459A Pathological fracture, hip, unspecified, initial encounter for fracture: Secondary | ICD-10-CM

## 2022-10-10 DIAGNOSIS — Z0000 Encounter for general adult medical examination without abnormal findings: Secondary | ICD-10-CM | POA: Diagnosis not present

## 2022-10-10 DIAGNOSIS — C7951 Secondary malignant neoplasm of bone: Secondary | ICD-10-CM

## 2022-10-10 DIAGNOSIS — Z515 Encounter for palliative care: Secondary | ICD-10-CM

## 2022-10-10 DIAGNOSIS — G893 Neoplasm related pain (acute) (chronic): Secondary | ICD-10-CM

## 2022-10-10 NOTE — Progress Notes (Signed)
Subjective:   Alicia Vasquez is a 82 y.o. female who presents for Medicare Annual (Subsequent) preventive examination. Along with Hedda Slade   Review of Systems     Cardiac Risk Factors include: advanced age (>33men, >36 women);obesity (BMI >30kg/m2);hypertension;sedentary lifestyle     Objective:    Today's Vitals   10/10/22 1516  BP: 122/64  Pulse: 87  Temp: 98 F (36.7 C)  SpO2: 94%  Weight: 208 lb (94.3 kg)   Body mass index is 35.7 kg/m.     10/10/2022    3:43 PM 07/26/2022    2:21 PM 03/15/2022   12:20 AM 09/13/2021    1:04 PM 05/29/2021    1:56 PM 02/08/2021    7:59 AM 01/05/2021    3:07 AM  Advanced Directives  Does Patient Have a Medical Advance Directive? No No No No No No Yes  Type of Advance Directive       Healthcare Power of Attorney  Does patient want to make changes to medical advance directive?       No - Patient declined  Would patient like information on creating a medical advance directive? Yes (MAU/Ambulatory/Procedural Areas - Information given) No - Patient declined No - Patient declined No - Patient declined No - Patient declined No - Patient declined     Current Medications (verified) Outpatient Encounter Medications as of 10/10/2022  Medication Sig   cholecalciferol (VITAMIN D3) 25 MCG (1000 UNIT) tablet Take 1,000 Units by mouth daily. Current dose 5000 units   levothyroxine (SYNTHROID) 125 MCG tablet Take 1 tablet (125 mcg total) by mouth daily before breakfast.   lisinopril (PRINIVIL) 10 MG tablet Take 1 tablet (10 mg total) by mouth daily.   ondansetron (ZOFRAN) 8 MG tablet TAKE ONE TABLET BY MOUTH EVERY 8 HOURS AS NEEDED FOR NAUSEA AND VOMITING   oxyCODONE (OXY IR/ROXICODONE) 5 MG immediate release tablet Take 1 tablet (5 mg total) by mouth every 6 (six) hours as needed for severe pain.   pantoprazole (PROTONIX) 40 MG tablet Take 1 tablet (40 mg total) by mouth daily before breakfast.   naloxone (NARCAN) 0.4 MG/ML injection Inject 1  mL (0.4 mg total) into the muscle as needed. (Patient not taking: Reported on 10/10/2022)   No facility-administered encounter medications on file as of 10/10/2022.    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    Hypertension    Hypothyroid    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   Personal history of radiation therapy    Pneumonia    PONV (postoperative nausea and vomiting)    Radiation 11/21/13-01/07/14   Right Breast/Supraclavicular   Past Surgical History:  Procedure Laterality Date   BREAST LUMPECTOMY Left 2015   BREAST LUMPECTOMY WITH RADIOACTIVE SEED LOCALIZATION Right 09/09/2013   Procedure: BREAST LUMPECTOMY WITH RADIOACTIVE SEED LOCALIZATION WITH AXILLARY NODE EXCISION;  Surgeon: Emelia Loron, MD;  Location: [facility];  Service: General;  Laterality: Right;   CYSTOSCOPY W/ URETERAL STENT PLACEMENT Right 01/07/2021   Procedure: CYSTOSCOPY WITH RETROGRADE PYELOGRAM/URETERAL STENT PLACEMENT;  Surgeon: Alfredo Martinez, MD;  Location: [facility];  Service: Urology;  Laterality: Right;   CYSTOSCOPY/URETEROSCOPY/HOLMIUM LASER/STENT PLACEMENT Right 02/08/2021   Procedure: CYSTOSCOPY, RIGHT URETEROSCOPY, RIGHT RETRGRADE PYELOGRAM, HOLMIUM LASER/STENT PLACEMENT;  Surgeon: Crista Elliot, MD;  Location: [facility];  Service: Urology;  Laterality: Right;   DENTAL SURGERY  04/19/2012   13 TEETH REMOVED   DILATION AND CURETTAGE OF UTERUS     IR RADIOLOGIST EVAL & MGMT  01/21/2021   ORIF PERIPROSTHETIC FRACTURE Left 01/31/2017   Procedure: REVISION and OPEN REDUCTION INTERNAL  FIXATION (ORIF) PERIPROSTHETIC FRACTURE LEFT HIP;  Surgeon: Durene Romans, MD;  Location: [facility];  Service: Orthopedics;  Laterality: Left;  120 mins   RE-EXCISION OF BREAST LUMPECTOMY Right 09/24/2013   Procedure: RE-EXCISION OF RIGHT BREAST LUMPECTOMY;  Surgeon: Emelia Loron, MD;  Location: [facility];  Service: General;  Laterality: Right;   TOTAL HIP ARTHROPLASTY Left 01/16/2017   Procedure: TOTAL HIP ARTHROPLASTY POSTERIOR;  Surgeon: Durene Romans, MD;  Location: [facility];  Service: Orthopedics;  Laterality: Left;   Family History  Problem Relation Age of Onset   Heart disease Brother    Colon cancer Brother    Prostate cancer Brother    Social History   Socioeconomic History   Marital status: Widowed    Spouse name: Not on file   Number of children: 1   Years of education: Not on file   Highest education level: Not on file  Occupational History   Not on file  Tobacco Use   Smoking status: Never   Smokeless tobacco: Never  Vaping Use   Vaping Use: Never used  Substance and Sexual Activity   Alcohol use: No    Alcohol/week: 0.0 standard drinks of alcohol   Drug use: No   Sexual activity: Never  Other Topics Concern   Not on file  Social History Narrative   Exercise: yard work.   Social Determinants of Health   Financial Resource Strain: High Risk (10/10/2022)   Overall Financial Resource Strain (CARDIA)    Difficulty of Paying Living Expenses: Hard  Food Insecurity: No Food Insecurity (10/10/2022)   Hunger Vital Sign    Worried About Running Out of Food in the Last Year: Never true    Ran Out of Food in the Last Year: Never true  Recent Concern: Food Insecurity - Food Insecurity Present (10/03/2022)   Hunger Vital Sign    Worried About Running Out of Food in the Last Year: Often true    Ran Out of Food in the Last Year: Often true  Transportation Needs: Unmet Transportation Needs (10/10/2022)   PRAPARE - Administrator, Civil Service  (Medical): Yes    Lack of Transportation (Non-Medical): No  Physical Activity: Inactive (10/10/2022)   Exercise Vital Sign    Days of Exercise per Week: 0 days    Minutes of Exercise per Session: 0 min  Stress: Stress Concern Present (10/10/2022)   [employer] of Occupational Health - Occupational Stress Questionnaire    Feeling of Stress : Very much  Social Connections: Socially Isolated (10/10/2022)   Social Connection and Isolation Panel [NHANES]    Frequency of Communication with Friends and Family: More than three times a week    Frequency of Social Gatherings with Friends and Family: Twice a week    Attends Religious Services: Never    Database administrator or Organizations: No    Attends Banker Meetings: Never    Marital Status: Widowed    Tobacco Counseling Counseling given: Not Answered  Clinical Intake:  Pre-visit preparation completed: Yes  Pain : No/denies pain     BMI - recorded: 35.7 Nutritional Status: BMI > 30  Obese Nutritional Risks: None Diabetes: No  How often do you need to have someone help you when you read instructions, pamphlets, or other written materials from your doctor or pharmacy?: 1 - Never  Diabetic?no  Interpreter Needed?: No  Information entered by :: Lanier Ensign, LPN   Activities of Daily Living    10/10/2022    3:45 PM 03/15/2022   12:20 AM  In your present state of health, do you have any difficulty performing the following activities:  Hearing? 1 1  Comment HOH   Vision? 0 0  Difficulty concentrating or making decisions? 0 0  Walking or climbing stairs? 1 1  Comment has assistance with a friend   Dressing or bathing? 1 1  Comment assistance with friend   Doing errands, shopping? 1 1  Comment scat and a friend Engineer, manufacturing and eating ? Y   Comment moms meals   Using the Toilet? Y   Comment with assistance   In the past six months, have you accidently leaked urine? Y   Comment wears  pad and pull ups   Do you have problems with loss of bowel control? Y   Comment at times can't get there fast enough   Managing your Medications? N   Managing your Finances? N   Housekeeping or managing your Housekeeping? Y     Patient Care Team: Ardith Dark, MD as PCP - General (Family Medicine) Carlus Pavlov, MD as Consulting Physician (Internal Medicine) Charlynne Pander, MD as Referring Physician (Internal Medicine) Lenn Sink, DPM as Consulting Physician (Podiatry) Alfredo Martinez, MD as Consulting Physician (Urology) Rachel Moulds, MD as Consulting Physician (Hematology and Oncology) Durene Romans, MD as Consulting Physician (Orthopedic Surgery) Fleeta Emmer, RN as [facility] Care Management  Indicate any recent Medical Services you may have received from other than [facility] providers in the past year (date may be approximate).     Assessment:   This is a routine wellness examination for [facility].  Hearing/Vision screen Hearing Screening - Comments:: Pt is very HOH  Vision Screening - Comments:: Pt follows up with Dr Cathey Endow for annual eye exams   Dietary issues and exercise activities discussed: Current Exercise Habits: The patient does not participate in regular exercise at present   Goals Addressed             This Visit's Progress    Patient Stated       To be able to walk        Depression Screen    10/10/2022    3:31 PM 09/02/2022    2:28 PM 08/24/2022   10:11 AM 07/01/2022   11:37 AM 01/27/2022    1:29 PM 10/26/2021    2:20 PM 09/13/2021    1:02 PM  PHQ 2/9 Scores  PHQ - 2 Score 1 0 1 0 1 0 0  PHQ- 9 Score     3      Fall Risk    10/10/2022    3:45 PM 09/02/2022    2:28 PM 07/01/2022   11:38 AM 07/01/2022   11:37 AM 01/27/2022    1:03 PM  Fall Risk   Falls in the past year? 0 0 1 0 0  Number falls in past yr: 0 0 1 0 0  Injury with Fall? 0 0 1  0 0  Risk for fall due to : Impaired mobility;Impaired balance/gait;Impaired vision No Fall  Risks History of fall(s) History of fall(s) No Fall Risks  Follow up Falls prevention discussed        FALL RISK PREVENTION PERTAINING TO THE HOME:  Any stairs in or around the home? No  If so, are there any without handrails? No  Home free of loose throw rugs in walkways, pet beds, electrical cords, etc? Yes  Adequate lighting in your home to reduce risk of falls? Yes   ASSISTIVE DEVICES UTILIZED TO PREVENT FALLS:  Life alert? No  Use of a cane, walker or w/c? Yes  Grab bars in the bathroom? Yes  Shower chair or bench in shower? Yes  Elevated toilet seat or a handicapped toilet? Yes   TIMED UP AND GO:  Was the test performed? No .  Gait unsteady with use of assistive device, provider informed and education provided.   Cognitive Function:        10/10/2022    3:49 PM 09/07/2020    1:15 PM  6CIT Screen  What Year? 0 points 0 points  What month? 0 points 0 points  What time? 0 points   Count back from 20 0 points 0 points  Months in reverse 4 points 0 points  Repeat phrase 0 points 0 points  Total Score 4 points     Immunizations Immunization History  Administered Date(s) Administered   Pneumococcal-Unspecified 07/20/2009    TDAP status: Due, Education has been provided regarding the importance of this vaccine. Advised may receive this vaccine at local pharmacy or Health Dept. Aware to provide a copy of the vaccination record if obtained from local pharmacy or Health Dept. Verbalized acceptance and understanding.  Flu Vaccine status: Declined, Education has been provided regarding the importance of this vaccine but patient still declined. Advised may receive this vaccine at local pharmacy or Health Dept. Aware to provide a copy of the vaccination record if obtained from local pharmacy or Health Dept. Verbalized acceptance and understanding.  Pneumococcal vaccine status: Due, Education has been provided regarding the importance of this vaccine. Advised may receive this  vaccine at local pharmacy or Health Dept. Aware to provide a copy of the vaccination record if obtained from local pharmacy or Health Dept. Verbalized acceptance and understanding.  Covid-19 vaccine status: Declined, Education has been provided regarding the importance of this vaccine but patient still declined. Advised may receive this vaccine at local pharmacy or Health Dept.or vaccine clinic. Aware to provide a copy of the vaccination record if obtained from local pharmacy or Health Dept. Verbalized acceptance and understanding.  Qualifies for Shingles Vaccine? Yes   Zostavax completed No   Shingrix Completed?: No.    Education has been provided regarding the importance of this vaccine. Patient has been advised to call insurance company to determine out of pocket expense if they have not yet received this vaccine. Advised may also receive vaccine at local pharmacy or Health Dept. Verbalized acceptance and understanding.  Screening Tests Health Maintenance  Topic Date Due   DTaP/Tdap/Td (1 - Tdap) Never done   Zoster Vaccines- Shingrix (1 of 2) Never done   Pneumonia Vaccine 74+ Years old (1 of 1 - PCV) 07/02/2023 (Originally 07/14/2004)   DEXA SCAN  Completed   HPV VACCINES  Aged Out   INFLUENZA VACCINE  Discontinued   COVID-19 Vaccine  Discontinued    Health Maintenance  Health Maintenance Due  Topic Date Due  DTaP/Tdap/Td (1 - Tdap) Never done   Zoster Vaccines- Shingrix (1 of 2) Never done    Colorectal cancer screening: No longer required.   Mammogram status: Completed 02/26/21. Repeat every year  Bone Density status: Completed 09/20/16. Results reflect: Bone density results: OSTEOPENIA. Repeat every 2 years.   Additional Screening:  Vision Screening: Recommended annual ophthalmology exams for early detection of glaucoma and other disorders of the eye. Is the patient up to date with their annual eye exam?  Yes  Who is the provider or what is the name of the office in which  the patient attends annual eye exams?  [facility] ophthalmology  If pt is not established with a provider, would they like to be referred to a provider to establish care? No .   Dental Screening: Recommended annual dental exams for proper oral hygiene  Community Resource Referral / Chronic Care Management: CRR required this visit?  No   CCM required this visit?  No      Plan:     I have personally reviewed and noted the following in the patient's chart:   Medical and social history Use of alcohol, tobacco or illicit drugs  Current medications and supplements including opioid prescriptions. Patient is currently taking opioid prescriptions. Information provided to patient regarding non-opioid alternatives. Patient advised to discuss non-opioid treatment plan with their provider. Functional ability and status Nutritional status Physical activity Advanced directives List of other physicians Hospitalizations, surgeries, and ER visits in previous 12 months Vitals Screenings to include cognitive, depression, and falls Referrals and appointments  In addition, I have reviewed and discussed with patient certain preventive protocols, quality metrics, and best practice recommendations. A written personalized care plan for preventive services as well as general preventive health recommendations were provided to patient.     Marzella Schlein, LPN   [id number]   Nurse Notes: Pt stated she has resources working on assisting her to to get housing. Pt declined any other assistance at this time

## 2022-10-10 NOTE — Telephone Encounter (Signed)
Left message to return call to our office at their convenience.  

## 2022-10-10 NOTE — Patient Instructions (Signed)
Alicia Vasquez , Thank you for taking time to come for your Medicare Wellness Visit. I appreciate your ongoing commitment to your health goals. Please review the following plan we discussed and let me know if I can assist you in the future.   These are the goals we discussed:  Goals      Obtain Stable and Safe Housing     Activities and task to complete in order to accomplish goals.   Schedule transportation through Cancer center for their upcoming appts. Utilize [facility] [place] for additional transportation needs Patient will comply with meds prescribed by doctors Continue working with [facility] to reach identified goals            Pain control/ getting into possible assisted living     Patient Goals/Self Care Activities: -Patient/Caregiver will self-administer medications as prescribed as evidenced by self-report/primary caregiver report  -Patient/Caregiver will attend all scheduled provider appointments as evidenced by clinician review of documented attendance to scheduled appointments and patient/caregiver report -Patient/Caregiver will call provider office for new concerns or questions as evidenced by review of documented incoming telephone call notes and patient report   Patient reports that account is no longer frozen and that her friend's daughter [name] has access to account and that [name] is working with patient advocate to get patient placed as son is leaving her for days at a time now.  Friends Venita Sheffield and [name] are visiting frequently with patient to help with care until she can get to an assisted living facility.   Patient went to cancer center for visit on yesterday.  Oncology  aware of patient social situation and plans to contact physician for [place] at this time for assisted living.     Pain controlled when patient takes her oxycodone.  Patient reports taking medication 4 times a day. Reiterated importance of pain control.         Patient Stated     Wants to walk again!  Keep working  on your exercises and eating well      Patient Stated     Walk again without a walker      Patient Stated     Walk without walker         This is a list of the screening recommended for you and due dates:  Health Maintenance  Topic Date Due   DTaP/Tdap/Td vaccine (1 - Tdap) Never done   Zoster (Shingles) Vaccine (1 of 2) Never done   Pneumonia Vaccine (1 of 1 - PCV) 07/02/2023*   DEXA scan (bone density measurement)  Completed   HPV Vaccine  Aged Out   Flu Shot  Discontinued   COVID-19 Vaccine  Discontinued  *Topic was postponed. The date shown is not the original due date.    Advanced directives: Advance directive discussed with you today. I have provided a copy for you to complete at home and have notarized. Once this is complete please bring a copy in to our office so we can scan it into your chart.  Conditions/risks identified: to get back to walking   Next appointment: Follow up in one year for your annual wellness visit    Preventive Care 65 Years and Older, Female Preventive care refers to lifestyle choices and visits with your health care provider that can promote health and wellness. What does preventive care include? A yearly physical exam. This is also called an annual well check. Dental exams once or twice a year. Routine eye exams. Ask your health care  provider how often you should have your eyes checked. Personal lifestyle choices, including: Daily care of your teeth and gums. Regular physical activity. Eating a healthy diet. Avoiding tobacco and drug use. Limiting alcohol use. Practicing safe sex. Taking low-dose aspirin every day. Taking vitamin and mineral supplements as recommended by your health care provider. What happens during an annual well check? The services and screenings done by your health care provider during your annual well check will depend on your age, overall health, lifestyle risk factors, and family history of disease. Counseling   Your health care provider may ask you questions about your: Alcohol use. Tobacco use. Drug use. Emotional well-being. Home and relationship well-being. Sexual activity. Eating habits. History of falls. Memory and ability to understand (cognition). Work and work Astronomer. Reproductive health. Screening  You may have the following tests or measurements: Height, weight, and BMI. Blood pressure. Lipid and cholesterol levels. These may be checked every 5 years, or more frequently if you are over 4 years old. Skin check. Lung cancer screening. You may have this screening every year starting at age 45 if you have a 30-pack-year history of smoking and currently smoke or have quit within the past 15 years. Fecal occult blood test (FOBT) of the stool. You may have this test every year starting at age 61. Flexible sigmoidoscopy or colonoscopy. You may have a sigmoidoscopy every 5 years or a colonoscopy every 10 years starting at age 64. Hepatitis C blood test. Hepatitis B blood test. Sexually transmitted disease (STD) testing. Diabetes screening. This is done by checking your blood sugar (glucose) after you have not eaten for a while (fasting). You may have this done every 1-3 years. Bone density scan. This is done to screen for osteoporosis. You may have this done starting at age 61. Mammogram. This may be done every 1-2 years. Talk to your health care provider about how often you should have regular mammograms. Talk with your health care provider about your test results, treatment options, and if necessary, the need for more tests. Vaccines  Your health care provider may recommend certain vaccines, such as: Influenza vaccine. This is recommended every year. Tetanus, diphtheria, and acellular pertussis (Tdap, Td) vaccine. You may need a Td booster every 10 years. Zoster vaccine. You may need this after age 29. Pneumococcal 13-valent conjugate (PCV13) vaccine. One dose is recommended  after age 48. Pneumococcal polysaccharide (PPSV23) vaccine. One dose is recommended after age 6. Talk to your health care provider about which screenings and vaccines you need and how often you need them. This information is not intended to replace advice given to you by your health care provider. Make sure you discuss any questions you have with your health care provider. Document Released: 06/12/2015 Document Revised: 02/03/2016 Document Reviewed: 03/17/2015 Elsevier Interactive Patient Education  2017 [employer].  Fall Prevention in the Home Falls can cause injuries. They can happen to people of all ages. There are many things you can do to make your home safe and to help prevent falls. What can I do on the outside of my home? Regularly fix the edges of walkways and driveways and fix any cracks. Remove anything that might make you trip as you walk through a door, such as a raised step or threshold. Trim any bushes or trees on the path to your home. Use bright outdoor lighting. Clear any walking paths of anything that might make someone trip, such as rocks or tools. Regularly check to see if handrails are  loose or broken. Make sure that both sides of any steps have handrails. Any raised decks and porches should have guardrails on the edges. Have any leaves, snow, or ice cleared regularly. Use sand or salt on walking paths during winter. Clean up any spills in your garage right away. This includes oil or grease spills. What can I do in the bathroom? Use night lights. Install grab bars by the toilet and in the tub and shower. Do not use towel bars as grab bars. Use non-skid mats or decals in the tub or shower. If you need to sit down in the shower, use a plastic, non-slip stool. Keep the floor dry. Clean up any water that spills on the floor as soon as it happens. Remove soap buildup in the tub or shower regularly. Attach bath mats securely with double-sided non-slip rug tape. Do not  have throw rugs and other things on the floor that can make you trip. What can I do in the bedroom? Use night lights. Make sure that you have a light by your bed that is easy to reach. Do not use any sheets or blankets that are too big for your bed. They should not hang down onto the floor. Have a firm chair that has side arms. You can use this for support while you get dressed. Do not have throw rugs and other things on the floor that can make you trip. What can I do in the kitchen? Clean up any spills right away. Avoid walking on wet floors. Keep items that you use a lot in easy-to-reach places. If you need to reach something above you, use a strong step stool that has a grab bar. Keep electrical cords out of the way. Do not use floor polish or wax that makes floors slippery. If you must use wax, use non-skid floor wax. Do not have throw rugs and other things on the floor that can make you trip. What can I do with my stairs? Do not leave any items on the stairs. Make sure that there are handrails on both sides of the stairs and use them. Fix handrails that are broken or loose. Make sure that handrails are as long as the stairways. Check any carpeting to make sure that it is firmly attached to the stairs. Fix any carpet that is loose or worn. Avoid having throw rugs at the top or bottom of the stairs. If you do have throw rugs, attach them to the floor with carpet tape. Make sure that you have a light switch at the top of the stairs and the bottom of the stairs. If you do not have them, ask someone to add them for you. What else can I do to help prevent falls? Wear shoes that: Do not have high heels. Have rubber bottoms. Are comfortable and fit you well. Are closed at the toe. Do not wear sandals. If you use a stepladder: Make sure that it is fully opened. Do not climb a closed stepladder. Make sure that both sides of the stepladder are locked into place. Ask someone to hold it for  you, if possible. Clearly mark and make sure that you can see: Any grab bars or handrails. First and last steps. Where the edge of each step is. Use tools that help you move around (mobility aids) if they are needed. These include: Canes. Walkers. Scooters. Crutches. Turn on the lights when you go into a dark area. Replace any light bulbs as soon as they burn  out. Set up your furniture so you have a clear path. Avoid moving your furniture around. If any of your floors are uneven, fix them. If there are any pets around you, be aware of where they are. Review your medicines with your doctor. Some medicines can make you feel dizzy. This can increase your chance of falling. Ask your doctor what other things that you can do to help prevent falls. This information is not intended to replace advice given to you by your health care provider. Make sure you discuss any questions you have with your health care provider. Document Released: 03/12/2009 Document Revised: 10/22/2015 Document Reviewed: 06/20/2014 Elsevier Interactive Patient Education  2017 [employer].

## 2022-10-11 NOTE — Patient Instructions (Signed)
Visit Information  Thank you for taking time to visit with me today. Please don't hesitate to contact me if I can be of assistance to you.   Following are the goals we discussed today:   Goals Addressed             This Visit's Progress    Obtain Stable and Safe Housing   On track    Activities and task to complete in order to accomplish goals.   Schedule transportation through Cancer center for their upcoming appts. Utilize [facility] [place] for additional transportation needs Patient will comply with meds prescribed by doctors Continue working with [facility] to reach identified goals               Please call the care guide team at [phone] if you need to cancel or reschedule your appointment.   If you are experiencing a Mental Health or Behavioral Health Crisis or need someone to talk to, please call the Suicide and Crisis Lifeline: 988 call 911   The patient verbalized understanding of instructions, educational materials, and care plan provided today and DECLINED offer to receive copy of patient instructions, educational materials, and care plan.   [name], MSW, LCSW [facility] Care Management [place]  [facility] [email or web address] Phone [phone] 6:41 PM

## 2022-10-11 NOTE — Patient Outreach (Signed)
  Care Coordination   Follow Up Visit Note   10/10/2022 Name: Alicia Vasquez [facility] MRN: [id number] DOB: [birth year]  Alicia Vasquez Vidrine is a 82 y.o. year old female who sees Jimmey Ralph, Katina Degree, MD for primary care. I spoke with  Ardith Dark Carns by phone today.  What matters to the patients health and wellness today?  Stress Management/Transportation    Goals Addressed             This Visit's Progress    Obtain Stable and Safe Housing   On track    Activities and task to complete in order to accomplish goals.   Schedule transportation through Cancer center for their upcoming appts. Utilize [facility] [place] for additional transportation needs Patient will comply with meds prescribed by doctors Continue working with [facility] to reach identified goals               SDOH assessments and interventions completed:  No     Care Coordination Interventions:  Yes, provided  Interventions Today    Flowsheet Row Most Recent Value  General Interventions   General Interventions Discussed/Reviewed General Interventions Reviewed  [LCSW reminded pt of scheduled transportation today for PCP appt. Pt reports that she is waiting on son to assist with getting her ready, a friend will accommpany her to appt. Pt reports stress due to a new report being made to APS. Encouragement provided]  Mental Health Interventions   Mental Health Discussed/Reviewed Mental Health Reviewed, Coping Strategies, Anxiety, Depression  [Pt provided consent for LCSW to speak with Lannette Donath and [name] may assist with financial resources. [name] is purchasing incontinent supplies. Pt will provide APS with LCSW's contact info]       Follow up plan: Follow up call scheduled for 1-2 weeks    Encounter Outcome:  Pt. Visit Completed   [name], MSW, LCSW [facility] Care Management [facility]  [facility] [email or web address] Phone [phone] 6:40 PM

## 2022-10-13 ENCOUNTER — Ambulatory Visit: Payer: Self-pay

## 2022-10-13 ENCOUNTER — Encounter: Payer: Self-pay | Admitting: Licensed Clinical Social Worker

## 2022-10-13 NOTE — Patient Outreach (Signed)
  Care Coordination   Follow Up Visit Note   10/13/2022 Name: Alicia Vasquez [facility] MRN: [id number] DOB: 08-21-1940  Alicia Vasquez is a 82 y.o. year old female who sees Alicia Vasquez, Katina Degree, MD for primary care. I spoke with  Alicia Vasquez by phone today.  What matters to the patients health and wellness today?  Continued pain control.  Wanting to get into assisted living.      Goals Addressed             This Visit's Progress    Pain control/ getting into possible assisted living       Patient Goals/Self Care Activities: -Patient/Caregiver will self-administer medications as prescribed as evidenced by self-report/primary caregiver report  -Patient/Caregiver will attend all scheduled provider appointments as evidenced by clinician review of documented attendance to scheduled appointments and patient/caregiver report -Patient/Caregiver will call provider office for new concerns or questions as evidenced by review of documented incoming telephone call notes and patient report   Patient reports that account is no longer frozen and that her friend's daughter Alicia Vasquez has access to account and that Alicia Vasquez is working with patient advocate to get patient place.  Patient reports that her son is back right now.  Friends Alicia Vasquez, and Alicia Vasquez are visiting frequently with patient to help with care until she can get to an assisted living facility.     Pain controlled when patient takes her oxycodone.  Patient reports taking medication 4 times a day. Reiterated importance of pain control.            SDOH assessments and interventions completed:  Yes     Care Coordination Interventions:  Yes, provided   Follow up plan: Follow up call scheduled for May 30.    Encounter Outcome:  Pt. Visit Completed   Alicia Leriche, RN, MSN [facility] Care Management Care Management Coordinator Direct Line [phone]

## 2022-10-13 NOTE — Patient Outreach (Signed)
  Care Coordination   Follow Up Visit Note   10/13/2022 Name: Alicia Vasquez MRN: [id number] DOB: [birth year]  Alicia Vasquez is a 82 y.o. year old female who sees Alicia Vasquez, Alicia Degree, MD for primary care. I  spoke with Alicia Vasquez with [employer]  What matters to the patients health and wellness today?  Pt was not engaged during this encounter     SDOH assessments and interventions completed:  No     Care Coordination Interventions:  Yes, provided   Follow up plan: Follow up call scheduled for 1-2 weeks    Encounter Outcome:  Pt. Visit Completed   Alicia Vasquez, MSW, LCSW [facility] Care Management [facility]  [facility] [email or web address] Phone [phone] 7:13 PM

## 2022-10-13 NOTE — Patient Instructions (Signed)
Visit Information  Thank you for taking time to visit with me today. Please don't hesitate to contact me if I can be of assistance to you.   Following are the goals we discussed today:   Goals Addressed             This Visit's Progress    Pain control/ getting into possible assisted living       Patient Goals/Self Care Activities: -Patient/Caregiver will self-administer medications as prescribed as evidenced by self-report/primary caregiver report  -Patient/Caregiver will attend all scheduled provider appointments as evidenced by clinician review of documented attendance to scheduled appointments and patient/caregiver report -Patient/Caregiver will call provider office for new concerns or questions as evidenced by review of documented incoming telephone call notes and patient report   Patient reports that account is no longer frozen and that her friend's daughter [name] has access to account and that [name] is working with patient advocate to get patient place.  Patient reports that her son is back right now.  Friends Quincy Sheehan, and [name] are visiting frequently with patient to help with care until she can get to an assisted living facility.     Pain controlled when patient takes her oxycodone.  Patient reports taking medication 4 times a day. Reiterated importance of pain control.            Our next appointment is by telephone on 10/27/22 at 130 pm  Please call the care guide team at [phone] if you need to cancel or reschedule your appointment.   If you are experiencing a Mental Health or Behavioral Health Crisis or need someone to talk to, please call the Suicide and Crisis Lifeline: 988   The patient verbalized understanding of instructions, educational materials, and care plan provided today and DECLINED offer to receive copy of patient instructions, educational materials, and care plan.   The patient has been provided with contact information for the care  management team and has been advised to call with any health related questions or concerns.   Bary Leriche, RN, MSN [facility] Care Management Care Management Coordinator Direct Line [phone]

## 2022-10-14 ENCOUNTER — Telehealth: Payer: Self-pay | Admitting: Licensed Clinical Social Worker

## 2022-10-17 NOTE — Patient Instructions (Signed)
Visit Information  Thank you for taking time to visit with me today. Please don't hesitate to contact me if I can be of assistance to you.   Following are the goals we discussed today:   Goals Addressed             This Visit's Progress    Obtain Stable and Safe Housing   On track    Activities and task to complete in order to accomplish goals.   Schedule transportation through Cancer center for their upcoming appts. Utilize [facility] [place] for additional transportation needs Patient will comply with meds prescribed by doctors Continue working with [facility] to reach identified goals Obtain info on APS worker, to provide to LCSW, per pt request               Please call the care guide team at [phone] if you need to cancel or reschedule your appointment.   If you are experiencing a Mental Health or Behavioral Health Crisis or need someone to talk to, please call the Suicide and Crisis Lifeline: 988 call 911   The patient verbalized understanding of instructions, educational materials, and care plan provided today and DECLINED offer to receive copy of patient instructions, educational materials, and care plan.   [name], MSW, LCSW [facility] Care Management [place]  [facility] [email or web address] Phone [phone] 7:35 PM

## 2022-10-17 NOTE — Patient Outreach (Signed)
  Care Coordination   Follow Up Visit Note   10/14/2022 Name: Alicia Vasquez [facility] MRN: [id number] DOB: 1941-05-14  Alicia Vasquez is a 82 y.o. year old female who sees Alicia Vasquez, Alicia Degree, MD for primary care. I spoke with  Alicia Vasquez by phone today.  What matters to the patients health and wellness today?  Stress Management    Goals Addressed             This Visit's Progress    Obtain Stable and Safe Housing   On track    Activities and task to complete in order to accomplish goals.   Schedule transportation through [facility] for their upcoming appts. Utilize Alicia Vasquez for additional transportation needs Patient will comply with meds prescribed by doctors Continue working with Alicia Vasquez to reach identified goals Obtain info on APS worker, to provide to Alicia Vasquez, per pt request               SDOH assessments and interventions completed:  No     Care Coordination Interventions:  Yes, provided  Interventions Today    Flowsheet Row Most Recent Value  Chronic Disease   Chronic disease during today's visit Hypertension (HTN)  General Interventions   General Interventions Discussed/Reviewed General Interventions Reviewed  [Stress management strategies discussed]  Mental Health Interventions   Mental Health Discussed/Reviewed Mental Health Reviewed, Coping Strategies, Anxiety       Follow up plan: Follow up call scheduled for 1-2 weeks    Encounter Outcome:  Pt. Visit Completed   [name], MSW, Alicia Vasquez [facility] Care Management [facility]  Alicia Vasquez Alicia Vasquez Phone [phone] 7:34 PM

## 2022-10-18 ENCOUNTER — Encounter: Payer: Self-pay | Admitting: Licensed Clinical Social Worker

## 2022-10-20 ENCOUNTER — Telehealth: Payer: Self-pay | Admitting: Licensed Clinical Social Worker

## 2022-10-20 NOTE — Patient Instructions (Signed)
Visit Information  Thank you for taking time to visit with me today. Please don't hesitate to contact me if I can be of assistance to you.   Following are the goals we discussed today:   Goals Addressed             This Visit's Progress    Obtain Stable and Safe Housing   On track    Activities and task to complete in order to accomplish goals.   Schedule transportation through Cancer center for their upcoming appts. Utilize [facility] [place] for additional transportation needs Patient will comply with meds prescribed by doctors Continue working with [facility] to reach identified goals Obtain info on APS worker, to provide to LCSW, per pt request               Please call the care guide team at [phone] if you need to cancel or reschedule your appointment.   If you are experiencing a Mental Health or Behavioral Health Crisis or need someone to talk to, please call the Suicide and Crisis Lifeline: 988 call 911   The patient verbalized understanding of instructions, educational materials, and care plan provided today and DECLINED offer to receive copy of patient instructions, educational materials, and care plan.   [name], MSW, LCSW [facility] Care Management [place]  [facility] [email or web address] Phone [phone] 4:40 PM

## 2022-10-20 NOTE — Patient Outreach (Signed)
  Care Coordination   Follow Up Visit Note   10/19/2022 Name: Italia [facility] MRN: [id number] DOB: November 05, 1940  Skylia Angelica Rakestraw is a 82 y.o. year old female who sees Jimmey Ralph, Katina Degree, MD for primary care. I spoke with  Ardith Dark Scicchitano by phone today.  What matters to the patients health and wellness today?  Transportation/Symptom Management    Goals Addressed             This Visit's Progress    Obtain Stable and Safe Housing   On track    Activities and task to complete in order to accomplish goals.   Schedule transportation through Cancer center for their upcoming appts. Utilize [employer] [place] for additional transportation needs Patient will comply with meds prescribed by doctors Continue working with [facility] to reach identified goals Obtain info on APS worker, to provide to LCSW, per pt request               SDOH assessments and interventions completed:  No     Care Coordination Interventions:  Yes, provided  Interventions Today    Flowsheet Row Most Recent Value  General Interventions   General Interventions Discussed/Reviewed General Interventions Reviewed, Communication with  [LCSW informed her of scheduled transporatation for appt on 5/24. Pt stated that she needs assistance with scheduling transportation for appt on 5/28.]  Communication with --  [LCSW collaborated with [employer] [place] to successfully schedule transportation for pt on 5/28.]  Mental Health Interventions   Mental Health Discussed/Reviewed Mental Health Reviewed, Coping Strategies       Follow up plan: Follow up call scheduled for 1 week    Encounter Outcome:  Pt. Visit Completed   [name], MSW, LCSW [facility] Care Management [facility]  [facility] [email or web address] Phone [phone] 4:40 PM

## 2022-10-20 NOTE — Patient Outreach (Signed)
  Care Coordination   Follow Up/Collab Visit Note   10/18/2022 Name: Alicia Vasquez [facility] MRN: [id number] DOB: Sep 06, 1940  Alicia Vasquez is a 82 y.o. year old female who sees Jimmey Ralph, Katina Degree, MD for primary care. I  engaged with [facility]  What matters to the patients health and wellness today?  Patient was not engaged during this encounter   SDOH assessments and interventions completed:  No     Care Coordination Interventions:  Yes, provided  Interventions Today    Flowsheet Row Most Recent Value  General Interventions   General Interventions Discussed/Reviewed General Interventions Reviewed, Communication with  Communication with --  [LCSW collaborated with Presenter, broadcasting to schedule transportation for pt's upcoming appt with the Cancer Center]       Follow up plan:  LCSW will f/up with patient    Encounter Outcome:  Pt. Visit Completed   [name], MSW, LCSW [facility] Care Management [facility]  [facility] [email or web address] Phone [phone] 4:03 PM

## 2022-10-21 ENCOUNTER — Other Ambulatory Visit: Payer: Self-pay

## 2022-10-21 ENCOUNTER — Inpatient Hospital Stay (HOSPITAL_BASED_OUTPATIENT_CLINIC_OR_DEPARTMENT_OTHER): Payer: Medicare Other | Admitting: Adult Health

## 2022-10-21 ENCOUNTER — Inpatient Hospital Stay: Payer: Medicare Other

## 2022-10-21 ENCOUNTER — Ambulatory Visit (HOSPITAL_COMMUNITY)
Admission: RE | Admit: 2022-10-21 | Discharge: 2022-10-21 | Disposition: A | Discharge status: Home / Self Care | Payer: Medicare Other | Source: Ambulatory Visit | Attending: Adult Health | Admitting: Adult Health

## 2022-10-21 ENCOUNTER — Encounter: Payer: Self-pay | Admitting: Adult Health

## 2022-10-21 VITALS — BP 130/63 | HR 88 | Temp 98.1°F | Resp 19

## 2022-10-21 DIAGNOSIS — C50411 Malignant neoplasm of upper-outer quadrant of right female breast: Secondary | ICD-10-CM | POA: Diagnosis not present

## 2022-10-21 DIAGNOSIS — Z5111 Encounter for antineoplastic chemotherapy: Secondary | ICD-10-CM | POA: Diagnosis not present

## 2022-10-21 DIAGNOSIS — Z170 Estrogen receptor positive status [ER+]: Secondary | ICD-10-CM

## 2022-10-21 DIAGNOSIS — R0781 Pleurodynia: Secondary | ICD-10-CM | POA: Insufficient documentation

## 2022-10-21 DIAGNOSIS — M899 Disorder of bone, unspecified: Secondary | ICD-10-CM

## 2022-10-21 DIAGNOSIS — R0789 Other chest pain: Secondary | ICD-10-CM | POA: Diagnosis not present

## 2022-10-21 DIAGNOSIS — G893 Neoplasm related pain (acute) (chronic): Secondary | ICD-10-CM | POA: Diagnosis not present

## 2022-10-21 DIAGNOSIS — M84659P Pathological fracture in other disease, hip, unspecified, subsequent encounter for fracture with malunion: Secondary | ICD-10-CM

## 2022-10-21 DIAGNOSIS — M8580 Other specified disorders of bone density and structure, unspecified site: Secondary | ICD-10-CM | POA: Diagnosis not present

## 2022-10-21 LAB — CBC WITH DIFFERENTIAL (CANCER CENTER ONLY)
Abs Immature Granulocytes: 0.03 10*3/uL (ref 0.00–0.07)
Basophils Absolute: 0.1 10*3/uL (ref 0.0–0.1)
Basophils Relative: 1 %
Eosinophils Absolute: 0.3 10*3/uL (ref 0.0–0.5)
Eosinophils Relative: 3 %
HCT: 36.2 % (ref 36.0–46.0)
Hemoglobin: 11.9 g/dL — ABNORMAL LOW (ref 12.0–15.0)
Immature Granulocytes: 0 %
Lymphocytes Relative: 17 %
Lymphs Abs: 1.3 10*3/uL (ref 0.7–4.0)
MCH: 29.1 pg (ref 26.0–34.0)
MCHC: 32.9 g/dL (ref 30.0–36.0)
MCV: 88.5 fL (ref 80.0–100.0)
Monocytes Absolute: 1 10*3/uL (ref 0.1–1.0)
Monocytes Relative: 14 %
Neutro Abs: 4.9 10*3/uL (ref 1.7–7.7)
Neutrophils Relative %: 65 %
Platelet Count: 230 10*3/uL (ref 150–400)
RBC: 4.09 MIL/uL (ref 3.87–5.11)
RDW: 15.5 % (ref 11.5–15.5)
WBC Count: 7.6 10*3/uL (ref 4.0–10.5)
nRBC: 0 % (ref 0.0–0.2)

## 2022-10-21 LAB — CMP (CANCER CENTER ONLY)
ALT: 14 U/L (ref 0–44)
AST: 17 U/L (ref 15–41)
Albumin: 3.7 g/dL (ref 3.5–5.0)
Alkaline Phosphatase: 118 U/L (ref 38–126)
Anion gap: 6 (ref 5–15)
BUN: 14 mg/dL (ref 8–23)
CO2: 28 mmol/L (ref 22–32)
Calcium: 10.6 mg/dL — ABNORMAL HIGH (ref 8.9–10.3)
Chloride: 105 mmol/L (ref 98–111)
Creatinine: 1.01 mg/dL — ABNORMAL HIGH (ref 0.44–1.00)
GFR, Estimated: 55 mL/min — ABNORMAL LOW (ref >60)
Glucose, Bld: 114 mg/dL — ABNORMAL HIGH (ref 70–99)
Potassium: 4.1 mmol/L (ref 3.5–5.1)
Sodium: 139 mmol/L (ref 135–145)
Total Bilirubin: 0.5 mg/dL (ref 0.3–1.2)
Total Protein: 6.3 g/dL — ABNORMAL LOW (ref 6.5–8.1)

## 2022-10-21 MED ORDER — FULVESTRANT 250 MG/5ML IM SOSY
500.0000 mg | PREFILLED_SYRINGE | Freq: Once | INTRAMUSCULAR | Status: AC
  Administered 2022-10-21: 500 mg via INTRAMUSCULAR
  Filled 2022-10-21: qty 10

## 2022-10-21 NOTE — Progress Notes (Signed)
[facility] Cancer Follow up:    Ardith Dark, MD [address] [place] [address]   DIAGNOSIS:  Cancer Staging  Malignant neoplasm of upper-outer quadrant of right breast in female, estrogen receptor positive (HCC) Staging form: Breast, AJCC 7th Edition - Clinical: Stage IIA (T1, N1, cM0) - Unsigned Specimen type: Core Needle Biopsy Histopathologic type: 9931 Laterality: Right Staging comments: Staged at breast conference 07/24/13.  - Pathologic: Stage IV (M1) - Unsigned Specimen type: Core Needle Biopsy Histopathologic type: 9931 Laterality: Right   SUMMARY OF ONCOLOGIC HISTORY: 82 y.o. [place] woman status post right breast upper outer quadrant lumpectomy and sentinel lymph node sampling 09/09/2013 for an mpT1c pN1a, stage IIA invasive ductal carcinoma, estrogen and progesterone receptor both 100% positive with strong staining intensity, MIB-1 of 17% and no HER-2 amplification   (1) additional surgery for margin clearance 09/16/2013 obtained negative margins   (2) Oncotype DX recurrence score of 4 predicts a risk of outside the breast recurrence within 10 years of 7% if the patient's only systemic therapy is tamoxifen for 5 years. It also predicts no benefit from chemotherapy   (3) adjuvant radiation completed 01/07/2014   (4) anastrozole started 02/27/2014 stopped within 2 weeks because of arm swelling.              (a) bone density April 2016 showed osteopenia, with a t-score of -1.6             (b) anastrozole resumed 12/17/2015             (c) Bone density 09/20/2016 showed a T score of -2.2   (5) history of left lower extremity DVT 11/23/2012, initially on rivaroxaban, which caused chest pain, switch to Coumadin July 2014  METASTATIC DISEASE: August 2018 (6) status post left total hip replacement 01/16/2017 for estrogen receptor positive adenocarcinoma.             (a) CA 27-29 was 46.4 as of 04/18/2017             (b) chest CT scan 05/02/2017  shows no lung or liver lesion concern; it does show aortic atherosclerosis             (c) baseline bone scan 05/02/2017 was negative             (d) PET scan 09/12/2017 shows no active disease, including bone             (e) PET scan and CT chest on 05/29/2018 show no active disease             (f) chest CT and bone scan 03/18/2019 showed no evidence of active disease             (g) lumbar spine MRI 01/06/2021 shows multiple compression fractures but no obvious metastases; noncontrast cervical spine and head CT scans 01/04/2021 showed no evidence of neoplastic disease   (7) fulvestrant started 04/18/2017   (8) denosumab/Xgeva started 05/16/2017             (a) changed to every 12-weeks after 10/03/2017 dose              (b) held starting with 06/13/2018 dose due to dental concerns   (9) unprovoked deep vein thrombosis involving the left posterior tibial v documented 11/23/2012, resolved on repeat 06/27/2013 and not recurrent on multiple Dopplers since, most recent 03/06/2018             (a) on chronic anticoagulation with warfarin given  ongoing risks (stage IV breast cancer, relative immobility secondary to claudication  CURRENT THERAPY: Faslodex  INTERVAL HISTORY: Alicia Vasquez 82 y.o. female returns for f/u of her metastatic breast cancer accompanied by her friend Alicia Vasquez.  Tuana reports that she fell last week.  Since that time she has had right chest wall pain.  Her friend Alicia Vasquez is concerned she may have a rib fracture.  She declined to go to the hospital because she was worried about being sent to a rehab facility similar to [facility] where she had a bad experience.  She notes itching in her groin.  She is doing well without taking the Coumadin.  She has not taken her pain medication since 8 AM.  She has not yet eaten to take another dose.  She plans to do this when she gets home.   Patient Active Problem List   Diagnosis Date Noted   Poor social situation 09/02/2022   GERD  (gastroesophageal reflux disease) 06/17/2021   Bilateral leg edema 06/03/2021   Pressure injury of left buttock, stage 1 06/03/2021   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   Weakness 04/27/2021   Morbid (severe) obesity due to excess calories (HCC) 05/31/2019   Aortic atherosclerosis (HCC) 06/13/2017   Malignant neoplasm metastatic to bone (HCC) 01/25/2017   Endometrial hyperplasia 01/16/2017   Lytic bone lesion of left femur 01/15/2017   Hip fracture, pathological (HCC) 01/15/2017   Lymphedema 09/17/2015   Long term current use of anticoagulant therapy 08/23/2015   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) 06/18/2015   Bilateral knee pain 04/03/2015   Arm edema 08/28/2014   Vitamin D deficiency 04/23/2014   Hyperparathyroidism (HCC) 04/23/2014   Depression 07/18/2013   Malignant neoplasm of upper-outer quadrant of right breast in female, estrogen receptor positive (HCC) 07/15/2013   Overactive bladder 01/30/2013   Primary hypercoagulable state (HCC) 12/19/2012   Essential hypertension 09/27/2011   Hearing loss 09/27/2011   Seasonal allergies 09/27/2011   Hypothyroidism 08/26/2011    is allergic to anesthetics, amide; benadryl [diphenhydramine hcl]; carbocaine [mepivacaine hcl]; codeine; epinephrine; sulfa antibiotics; diphenhydramine; latex; penicillins; and tramadol.  MEDICAL HISTORY: 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    Hypertension    Hypothyroid    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   Personal history of radiation therapy    Pneumonia    PONV (postoperative nausea and vomiting)    Radiation 11/21/13-01/07/14   Right Breast/Supraclavicular    SURGICAL HISTORY: Past Surgical History:  Procedure Laterality Date   BREAST LUMPECTOMY  Left 2015   BREAST LUMPECTOMY WITH RADIOACTIVE SEED LOCALIZATION Right 09/09/2013   Procedure: BREAST LUMPECTOMY WITH RADIOACTIVE SEED LOCALIZATION WITH AXILLARY NODE EXCISION;  Surgeon: Emelia Loron, MD;  Location: [facility];  Service: General;  Laterality: Right;   CYSTOSCOPY W/ URETERAL STENT PLACEMENT Right 01/07/2021   Procedure: CYSTOSCOPY WITH RETROGRADE PYELOGRAM/URETERAL STENT PLACEMENT;  Surgeon: Alfredo Martinez, MD;  Location: [facility];  Service: Urology;  Laterality: Right;   CYSTOSCOPY/URETEROSCOPY/HOLMIUM LASER/STENT PLACEMENT Right 02/08/2021   Procedure: CYSTOSCOPY, RIGHT URETEROSCOPY, RIGHT RETRGRADE PYELOGRAM, HOLMIUM LASER/STENT PLACEMENT;  Surgeon: Crista Elliot, MD;  Location: [facility];  Service: Urology;  Laterality: Right;   DENTAL SURGERY  04/19/2012   13  TEETH REMOVED   DILATION AND CURETTAGE OF UTERUS     IR RADIOLOGIST EVAL & MGMT  01/21/2021   ORIF PERIPROSTHETIC FRACTURE Left 01/31/2017   Procedure: REVISION and OPEN REDUCTION INTERNAL FIXATION (ORIF) PERIPROSTHETIC FRACTURE LEFT HIP;  Surgeon: Durene Romans, MD;  Location: [facility];  Service: Orthopedics;  Laterality: Left;  120 mins   RE-EXCISION OF BREAST LUMPECTOMY Right 09/24/2013   Procedure: RE-EXCISION OF RIGHT BREAST LUMPECTOMY;  Surgeon: Emelia Loron, MD;  Location: [facility];  Service: General;  Laterality: Right;   TOTAL HIP ARTHROPLASTY Left 01/16/2017   Procedure: TOTAL HIP ARTHROPLASTY POSTERIOR;  Surgeon: Durene Romans, MD;  Location: [facility];  Service: Orthopedics;  Laterality: Left;    SOCIAL HISTORY: Social History   Socioeconomic History   Marital status: Widowed    Spouse name: Not on file   Number of children: 1   Years of education: Not on file   Highest education level: Not on file  Occupational History   Not on file  Tobacco Use   Smoking status: Never   Smokeless tobacco: Never  Vaping Use   Vaping Use: Never used  Substance and Sexual Activity    Alcohol use: No    Alcohol/week: 0.0 standard drinks of alcohol   Drug use: No   Sexual activity: Never  Other Topics Concern   Not on file  Social History Narrative   Exercise: yard work.   Social Determinants of Health   Financial Resource Strain: High Risk (10/10/2022)   Overall Financial Resource Strain (CARDIA)    Difficulty of Paying Living Expenses: Hard  Food Insecurity: No Food Insecurity (10/10/2022)   Hunger Vital Sign    Worried About Running Out of Food in the Last Year: Never true    Ran Out of Food in the Last Year: Never true  Recent Concern: Food Insecurity - Food Insecurity Present (10/03/2022)   Hunger Vital Sign    Worried About Running Out of Food in the Last Year: Often true    Ran Out of Food in the Last Year: Often true  Transportation Needs: Unmet Transportation Needs (10/10/2022)   PRAPARE - Administrator, Civil Service (Medical): Yes    Lack of Transportation (Non-Medical): No  Physical Activity: Inactive (10/10/2022)   Exercise Vital Sign    Days of Exercise per Week: 0 days    Minutes of Exercise per Session: 0 min  Stress: Stress Concern Present (10/10/2022)   [employer] of Occupational Health - Occupational Stress Questionnaire    Feeling of Stress : Very much  Social Connections: Socially Isolated (10/10/2022)   Social Connection and Isolation Panel [NHANES]    Frequency of Communication with Friends and Family: More than three times a week    Frequency of Social Gatherings with Friends and Family: Twice a week    Attends Religious Services: Never    Database administrator or Organizations: No    Attends Banker Meetings: Never    Marital Status: Widowed  Intimate Partner Violence: Not At Risk (10/10/2022)   Humiliation, Afraid, Rape, and Kick questionnaire    Fear of Current or Ex-Partner: No    Emotionally Abused: No    Physically Abused: No    Sexually Abused: No    FAMILY HISTORY: Family History  Problem  Relation Age of Onset   Heart disease Brother    Colon cancer Brother    Prostate cancer Brother     Review of Systems  Constitutional:  Negative for appetite change, chills, fatigue, fever and unexpected weight change.  HENT:   Negative for hearing loss, lump/mass and trouble swallowing.   Eyes:  Negative for eye problems and icterus.  Respiratory:  Negative for chest tightness, cough and shortness of breath.   Cardiovascular:  Negative for chest pain, leg swelling and palpitations.  Gastrointestinal:  Negative for abdominal distention, abdominal pain, constipation, diarrhea, nausea and vomiting.  Endocrine: Negative for hot flashes.  Genitourinary:  Negative for difficulty urinating.   Musculoskeletal:  Negative for arthralgias.  Skin:  Negative for itching and rash.  Neurological:  Negative for dizziness, extremity weakness, headaches and numbness.  Hematological:  Negative for adenopathy. Does not bruise/bleed easily.  Psychiatric/Behavioral:  Negative for depression. The patient is not nervous/anxious.       PHYSICAL EXAMINATION    Vitals:   10/21/22 1354  BP: 130/63  Pulse: 88  Resp: 19  Temp: 98.1 F (36.7 C)  SpO2: 94%    Physical Exam Constitutional:      Appearance: She is obese.     Comments: Patient appears unclean with poor hygiene.  Her close are visibly soiled with dirt.  She smells strongly of urine.  HENT:     Head: Normocephalic.  Eyes:     General: No scleral icterus. Cardiovascular:     Rate and Rhythm: Normal rate and regular rhythm.     Pulses: Normal pulses.     Heart sounds: Normal heart sounds.  Pulmonary:     Effort: Pulmonary effort is normal.     Breath sounds: Normal breath sounds.  Abdominal:     General: There is no distension.     Palpations: There is no mass.     Tenderness: There is no abdominal tenderness.  Musculoskeletal:     Comments: Right seventh rib + TTP  Skin:    General: Skin is warm.  Neurological:     General:  No focal deficit present.     Mental Status: She is alert.  Psychiatric:     Comments: Tearful throughout visit about hoping to find assisted living facility soon.     LABORATORY DATA:  CBC    Component Value Date/Time   WBC 7.6 10/21/2022 1338   WBC 9.7 06/28/2022 1223   RBC 4.09 10/21/2022 1338   HGB 11.9 (L) 10/21/2022 1338   HGB 11.6 05/16/2017 1417   HCT 36.2 10/21/2022 1338   HCT 36.7 05/16/2017 1417   PLT 230 10/21/2022 1338   PLT 323 05/16/2017 1417   MCV 88.5 10/21/2022 1338   MCV 80.8 05/16/2017 1417   MCH 29.1 10/21/2022 1338   MCHC 32.9 10/21/2022 1338   RDW 15.5 10/21/2022 1338   RDW 16.8 (H) 05/16/2017 1417   LYMPHSABS 1.3 10/21/2022 1338   LYMPHSABS 1.1 05/16/2017 1417   MONOABS 1.0 10/21/2022 1338   MONOABS 0.8 05/16/2017 1417   EOSABS 0.3 10/21/2022 1338   EOSABS 0.2 05/16/2017 1417   BASOSABS 0.1 10/21/2022 1338   BASOSABS 0.1 05/16/2017 1417    CMP     Component Value Date/Time   NA 139 10/21/2022 1338   NA 141 05/16/2017 1416   K 4.1 10/21/2022 1338   K 3.8 05/16/2017 1416   CL 105 10/21/2022 1338   CO2 28 10/21/2022 1338   CO2 27 05/16/2017 1416   GLUCOSE 114 (H) 10/21/2022 1338   GLUCOSE 140 05/16/2017 1416   BUN 14 10/21/2022 1338   BUN 13.2 05/16/2017 1416   CREATININE 1.01 (  H) 10/21/2022 1338   CREATININE 0.80 09/22/2017 1555   CREATININE 0.8 05/16/2017 1416   CALCIUM 10.6 (H) 10/21/2022 1338   CALCIUM 11.0 (H) 05/16/2017 1416   PROT 6.3 (L) 10/21/2022 1338   PROT 6.9 05/16/2017 1416   ALBUMIN 3.7 10/21/2022 1338   ALBUMIN 3.6 05/16/2017 1416   AST 17 10/21/2022 1338   AST 13 05/16/2017 1416   ALT 14 10/21/2022 1338   ALT <6 05/16/2017 1416   ALKPHOS 118 10/21/2022 1338   ALKPHOS 130 05/16/2017 1416   BILITOT 0.5 10/21/2022 1338   BILITOT 0.31 05/16/2017 1416   GFRNONAA 55 (L) 10/21/2022 1338   GFRNONAA 75 08/19/2015 1602   GFRAA >60 08/07/2019 1305   GFRAA >60 03/21/2018 1417   GFRAA 87 08/19/2015 1602         ASSESSMENT and THERAPY PLAN:   Malignant neoplasm of upper-outer quadrant of right breast in female, estrogen receptor positive (HCC) Alicia Vasquez is an 82 year old woman with history of stage IV breast cancer to the left femur continuing on Faslodex therapy every 4 weeks.  Stage IV breast cancer: She will proceed with treatment today with Faslodex.  Her labs are stable.  She has not yet undergone CT chest abdomen pelvis and bone scan as recommended by Dr. Al Pimple.  My nurse is going to call to see if she can help get this scheduled. Right chest wall pain: I ordered plain films of her ribs and chest so we can further evaluate. Poor hygiene: I asked my nurse to page social work for follow up and discussion with the [place] Adult protective services representative. Pain: She is taking oxycodone PRN and will continue this.  She plans to take this when she gets home.  If she has pain not relieved by Oxycodone, I encouraged her to go to the ER.    Khaia will return in 4 weeks for labs, f/u with Dr. Al Pimple, and her next visit.    All questions were answered. The patient knows to call the clinic with any problems, questions or concerns. We can certainly see the patient much sooner if necessary.  Total encounter time:30 minutes*in face-to-face visit time, chart review, lab review, care coordination, order entry, and documentation of the encounter time.    Lillard Anes, NP 10/21/22 3:08 PM Medical Oncology and Hematology [facility] [address] [place], [place] [address] Tel. [phone]    Fax. [phone]  *Total Encounter Time as defined by the Centers for Medicare and Medicaid Services includes, in addition to the face-to-face time of a patient visit (documented in the note above) non-face-to-face time: obtaining and reviewing outside history, ordering and reviewing medications, tests or procedures, care coordination (communications with other health care  professionals or caregivers) and documentation in the medical record.

## 2022-10-21 NOTE — Assessment & Plan Note (Signed)
Alicia Vasquez is an 82 year old woman with history of stage IV breast cancer to the left femur continuing on Faslodex therapy every 4 weeks.  Stage IV breast cancer: She will proceed with treatment today with Faslodex.  Her labs are stable.  She has not yet undergone CT chest abdomen pelvis and bone scan as recommended by Dr. Al Pimple.  My nurse is going to call to see if she can help get this scheduled. Right chest wall pain: I ordered plain films of her ribs and chest so we can further evaluate. Poor hygiene: I asked my nurse to page social work for follow up and discussion with the [place] Adult protective services representative. Pain: She is taking oxycodone PRN and will continue this.  She plans to take this when she gets home.  If she has pain not relieved by Oxycodone, I encouraged her to go to the ER.    [name] will return in 4 weeks for labs, f/u with Dr. Al Pimple, and her next visit.

## 2022-10-25 ENCOUNTER — Encounter: Payer: Self-pay | Admitting: Internal Medicine

## 2022-10-25 ENCOUNTER — Ambulatory Visit (INDEPENDENT_AMBULATORY_CARE_PROVIDER_SITE_OTHER): Payer: Medicare Other | Admitting: Internal Medicine

## 2022-10-25 VITALS — BP 110/80 | HR 62 | Ht 64.0 in

## 2022-10-25 DIAGNOSIS — E039 Hypothyroidism, unspecified: Secondary | ICD-10-CM | POA: Diagnosis not present

## 2022-10-25 DIAGNOSIS — E559 Vitamin D deficiency, unspecified: Secondary | ICD-10-CM | POA: Diagnosis not present

## 2022-10-25 DIAGNOSIS — E213 Hyperparathyroidism, unspecified: Secondary | ICD-10-CM

## 2022-10-25 LAB — VITAMIN D 25 HYDROXY (VIT D DEFICIENCY, FRACTURES): VITD: 57.87 ng/mL (ref 30.00–100.00)

## 2022-10-25 LAB — T4, FREE: Free T4: 1.64 ng/dL — ABNORMAL HIGH (ref 0.60–1.60)

## 2022-10-25 LAB — TSH: TSH: 0.17 u[IU]/mL — ABNORMAL LOW (ref 0.35–5.50)

## 2022-10-25 MED ORDER — LEVOTHYROXINE SODIUM 112 MCG PO TABS: 112.0000 ug | ORAL_TABLET | Freq: Every day | ORAL | 5 refills | Status: DC

## 2022-10-25 NOTE — Progress Notes (Unsigned)
Palliative Medicine [facility]  Telephone:([phone] Fax:([phone]) [phone]   Name: Ceretha Lenth Degollado Date: 10/25/2022 MRN: [id number]  DOB: [birth year]  Patient Care Team: Ardith Dark, MD as PCP - General (Family Medicine) Carlus Pavlov, MD as Consulting Physician (Internal Medicine) Charlynne Pander, MD as Referring Physician (Internal Medicine) Lenn Sink, DPM as Consulting Physician (Podiatry) Alfredo Martinez, MD as Consulting Physician (Urology) Rachel Moulds, MD as Consulting Physician (Hematology and Oncology) Durene Romans, MD as Consulting Physician (Orthopedic Surgery) Fleeta Emmer, RN as [facility] Care Management   I connected with Ardith Dark Shadoan on 10/25/22 at 10:30 AM EDT by phone and verified that I am speaking with the correct person using two identifiers.   I discussed the limitations, risks, security and privacy concerns of performing an evaluation and management service by telemedicine and the availability of in-person appointments. I also discussed with the patient that there may be a patient responsible charge related to this service. The patient expressed understanding and agreed to proceed.   Other persons participating in the visit and their role in the encounter: n/a   Patient's location: home  Provider's location: [facility]   Chief Complaint: f/u of symptom management   INTERVAL HISTORY: Iram Heimann Stancil is a 82 y.o. female with oncology medical history including right breast cancer ER positive s/p adjuvant radiation (2015), found to have metastatic disease August 2018 s/p left total hip replacement, multiple compression fractures, currently on fulvestrant therapy.  Palliative ask to see for symptom management and goals of care.   SOCIAL HISTORY:     reports that she has never smoked. She has never used smokeless tobacco. She reports that she does not drink alcohol and does not use drugs.  ADVANCE  DIRECTIVES:    CODE STATUS:   PAST MEDICAL HISTORY: 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    Hypertension    Hypothyroid    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   Personal history of radiation therapy    Pneumonia    PONV (postoperative nausea and vomiting)    Radiation 11/21/13-01/07/14   Right Breast/Supraclavicular    ALLERGIES:  is allergic to anesthetics, amide; benadryl [diphenhydramine hcl]; carbocaine [mepivacaine hcl]; codeine; epinephrine; sulfa antibiotics; diphenhydramine; latex; penicillins; and tramadol.  MEDICATIONS:  Current Outpatient Medications  Medication Sig Dispense Refill   cholecalciferol (VITAMIN D3) 25 MCG (1000 UNIT) tablet Take 1,000 Units by mouth daily. Current dose 5000 units     levothyroxine (SYNTHROID) 125 MCG tablet Take 1 tablet (125 mcg total) by mouth daily before breakfast. 90 tablet 0   lisinopril (PRINIVIL) 10 MG tablet Take 1 tablet (10 mg total) by mouth daily. 90 tablet 1   naloxone (NARCAN) 0.4 MG/ML injection Inject 1 mL (0.4 mg total) into the muscle as needed. (Patient not taking: Reported on 10/10/2022) 1 mL 0   ondansetron (ZOFRAN) 8 MG tablet TAKE ONE TABLET BY MOUTH EVERY 8 HOURS AS NEEDED FOR NAUSEA AND VOMITING 90 tablet 0   oxyCODONE (OXY IR/ROXICODONE) 5 MG immediate release tablet Take 1 tablet (5 mg total) by mouth every 6 (six) hours as needed for severe pain. 60 tablet 0   pantoprazole (PROTONIX) 40 MG tablet Take 1 tablet (40 mg  total) by mouth daily before breakfast. 30 tablet 2   No current facility-administered medications for this visit.    VITAL SIGNS: There were no vitals taken for this visit. There were no vitals filed for this visit.  Estimated body mass index is 35.7 kg/m as calculated from the following:    Height as of 10/25/22: 5\' 4"  (1.626 m).   Weight as of 10/10/22: 208 lb (94.3 kg).    PERFORMANCE STATUS (ECOG) : 2 - Symptomatic, <50% confined to bed  Impression  I connected with Ms. Bleich by phone. Patient shares she had feels that she pulled a muscle when attempting to walk with a walker. Pain is slowly improving but is still nagging on top of previous pain.   Patient shares friend and DSS is trying to get her into an Assisted Living facility allowing her to move out of the hotel with her son. She is remaining hopeful this will happen.   Neoplasm related pain Ms. Hitz states her pain is mostly controlled on current regimen despite recent strain. We reviewed recent x-ray which did not show any acute abnormalities.   We reviewed her medications. She is taking Tylenol 325mg  twice daily. Advised she make take 2 tablets three times daily for mild aches and pains. Oxycodone 5mg  every 6 hours as needed.   We will continue to support and follow.   Constipation Controlled with stool softeners daily.   I discussed the importance of continued conversation with family and their medical providers regarding overall plan of care and treatment options, ensuring decisions are within the context of the patients values and GOCs.  PLAN:  Oxycodone 5 mg every 6 hours as needed.  Zofran 8 mg every 8 hours as needed for nausea.  Patient states she generally has nausea at least 2-3 days post Faslodex injection Colace twice daily Tylenol 650mg  three times daily Protonix 40 mg daily  Ongoing symptom management and support as needed I will plan to see patient back in 3-4 weeks in collaboration with other oncology appointments.    Patient expressed understanding and was in agreement with this plan. She also understands that She can call the clinic at any time with any questions, concerns, or complaints.    Any controlled substances utilized were prescribed in the context of palliative care. PDMP has  been reviewed.    Visit consisted of counseling and education dealing with the complex and emotionally intense issues of symptom management and palliative care in the setting of serious and potentially life-threatening illness.Greater than 50%  of this time was spent counseling and coordinating care related to the above assessment and plan.  [name], AGPCNP-BC  Palliative Medicine Team/[facility]  *Please note that this is a verbal dictation therefore any spelling or grammatical errors are due to the "Dragon Medical One" system interpretation.

## 2022-10-25 NOTE — Progress Notes (Signed)
Patient ID: Alicia Vasquez, female   DOB: 1940/12/23, 82 y.o.   MRN: [id number]  HPI  Alicia Vasquez is a 82 y.o.-year-old female, returning for follow-up for hyperparathyroidism and severe vitamin D deficiency, hypothyroidism. Last visit 1 year ago.  She is here with a friend who helps [name] converse since patient is very hypoacusic.  Interim history: She had a fall back into the chair in 02/2022 >> hairline femoral fracture. She also had a back muscle strain few days ago..  She had an x-ray but no rib fractures. She still feels very tired and has back and joint aches.  Reviewed history: Pt was dx with found to have an elevated parathyroid hormone in 02/2014.   She also had high calcium levels >> high at 11.5.  Per her preference, we are following her clinically and biochemically.  I reviewed pertinent labs: Lab Results  Component Value Date   PTH 46 05/05/2017   PTH 26 12/06/2016   PTH Comment 12/06/2016   PTH 31 12/07/2015   PTH Comment 12/07/2015   PTH 37 06/09/2015   PTH Comment 06/09/2015   PTH 37 12/19/2014   PTH Comment 12/19/2014   PTH 88 (H) 07/27/2014   CALCIUM 10.6 (H) 10/21/2022   CALCIUM 10.8 (H) 10/03/2022   CALCIUM 11.3 (H) 09/23/2022   CALCIUM 11.2 (H) 08/25/2022   CALCIUM 10.5 (H) 07/26/2022   CALCIUM 11.4 (H) 06/28/2022   CALCIUM 10.9 (H) 05/03/2022   CALCIUM 10.3 03/17/2022   CALCIUM 10.6 (H) 03/16/2022   CALCIUM 10.5 (H) 03/14/2022   Vitamin D deficiency-PTH level normalized after normalization of her vitamin D levels, however, calcium levels remain elevated.  Pt was taking 1000 IU vitamin D in the past - a level was very low then >> we increased her vitamin D supplement to 5000 units daily >> vitamin D normalized.  She takes this dose now.  Latest vitamin D level was normal: Lab Results  Component Value Date   VD25OH 45.67 10/18/2021   VD25OH 47.9 10/19/2020   VD25OH 49 06/21/2019   VD25OH 59.91 06/14/2018   VD25OH 63 05/05/2017    VD25OH 45.98 12/06/2016   VD25OH 47.68 12/07/2015   VD25OH 45.83 06/09/2015   VD25OH 30.58 12/19/2014   VD25OH 26.82 (L) 10/17/2014   Magnesium, phosphorus, calcitriol levels were normal: Component     Latest Ref Rng 04/18/2014         3:04 PM  Vitamin D 1, 25 (OH) Total     18 - 72 pg/mL 55  Vitamin D3 1, 25 (OH)      55  Vitamin D2 1, 25 (OH)      <8  Magnesium     1.5 - 2.5 mg/dL 2.1  Phosphorus     2.3 - 4.6 mg/dL 2.8   She did not collect a 24h urine.  Reviewed previous DXA scan reports: - 09/03/2014: no osteoporosis/but osteopenia at LFN: -1.6 - 08/2016: Osteopenia RFN: -2.2, LFN: -2.1; R radius 33% distal: -0.2  She had the following fractures: - 1994: R foot  - 1990's: L hairline fracture  - 12/2016: Periprosthetic pathological hip fracture - 12/2020: Multiple L spine compression fractures on L MRI - 02/2022: Left femoral fracture on CT hip  She was on Xgeva for breast cancer metastatic to the bones -she has a metastasis to the left femoral bone >> off 2/2 dental concerns  She has a history of kidney stones x1 in the past and again in 2022 >> had surgery >>  went to [place] after this. She fell there >> L prosthesis cracked.  No CKD. Last BUN/Cr: Lab Results  Component Value Date   BUN 14 10/21/2022   CREATININE 1.01 (H) 10/21/2022   She also has a history of breast cancer and is on Arimidex. She had RxTx. No ChTx.  Hypothyroidism:  Pt is on levothyroxine 125 mcg daily, taken: - in am - fasting - at least 30 min from b'fast - no Ca, Fe, MVI - on PPIs now (later in the day) - not on Biotin  Last TSH normal: Lab Results  Component Value Date   TSH 2.302 08/18/2021   Pt denies: - feeling nodules in neck - dysphagia - SOB with lying down But has hoarseness, and occasional chocking (in the afternoon) which is chronic. In the recliner, due to the risk of falling out of bed at night.  ROS: + See HPI Constitutional: + fatigue  I reviewed pt's  medications, allergies, PMH, social hx, family hx, and changes were documented in the history of present illness. Otherwise, unchanged from my initial visit note.   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    Hypertension    Hypothyroid    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   Personal history of radiation therapy    Pneumonia    PONV (postoperative nausea and vomiting)    Radiation 11/21/13-01/07/14   Right Breast/Supraclavicular   Past Surgical History:  Procedure Laterality Date   BREAST LUMPECTOMY Left 2015   BREAST LUMPECTOMY WITH RADIOACTIVE SEED LOCALIZATION Right 09/09/2013   Procedure: BREAST LUMPECTOMY WITH RADIOACTIVE SEED LOCALIZATION WITH AXILLARY NODE EXCISION;  Surgeon: Emelia Loron, MD;  Location: [facility];  Service: General;  Laterality: Right;   CYSTOSCOPY W/ URETERAL STENT PLACEMENT Right 01/07/2021   Procedure: CYSTOSCOPY WITH RETROGRADE PYELOGRAM/URETERAL STENT PLACEMENT;  Surgeon: Alfredo Martinez, MD;  Location: [facility];  Service: Urology;  Laterality: Right;   CYSTOSCOPY/URETEROSCOPY/HOLMIUM LASER/STENT PLACEMENT Right 02/08/2021   Procedure: CYSTOSCOPY, RIGHT URETEROSCOPY, RIGHT RETRGRADE PYELOGRAM, HOLMIUM LASER/STENT PLACEMENT;  Surgeon: Crista Elliot, MD;  Location: [facility];  Service: Urology;  Laterality: Right;   DENTAL SURGERY  04/19/2012   13 TEETH REMOVED   DILATION AND CURETTAGE OF UTERUS     IR RADIOLOGIST EVAL & MGMT  01/21/2021   ORIF PERIPROSTHETIC FRACTURE Left 01/31/2017   Procedure: REVISION and OPEN REDUCTION INTERNAL FIXATION (ORIF) PERIPROSTHETIC FRACTURE LEFT HIP;  Surgeon: Durene Romans, MD;  Location: [facility];  Service: Orthopedics;  Laterality: Left;  120 mins   RE-EXCISION OF BREAST LUMPECTOMY Right 09/24/2013    Procedure: RE-EXCISION OF RIGHT BREAST LUMPECTOMY;  Surgeon: Emelia Loron, MD;  Location: [facility];  Service: General;  Laterality: Right;   TOTAL HIP ARTHROPLASTY Left 01/16/2017   Procedure: TOTAL HIP ARTHROPLASTY POSTERIOR;  Surgeon: Durene Romans, MD;  Location: [facility];  Service: Orthopedics;  Laterality: Left;   History   Social History   Marital Status: Widowed    Spouse Name: N/A    Number of Children: 1   Occupational History   Retired IT consultant   Social History Main Topics   Smoking status: Never Smoker    Smokeless tobacco: Not on file   Alcohol Use: No   Drug Use:  No   Social History Narrative   Exercise yard work   Current Outpatient Medications on File Prior to Visit  Medication Sig Dispense Refill   cholecalciferol (VITAMIN D3) 25 MCG (1000 UNIT) tablet Take 1,000 Units by mouth daily. Current dose 5000 units     levothyroxine (SYNTHROID) 125 MCG tablet Take 1 tablet (125 mcg total) by mouth daily before breakfast. 90 tablet 0   lisinopril (PRINIVIL) 10 MG tablet Take 1 tablet (10 mg total) by mouth daily. 90 tablet 1   naloxone (NARCAN) 0.4 MG/ML injection Inject 1 mL (0.4 mg total) into the muscle as needed. (Patient not taking: Reported on 10/10/2022) 1 mL 0   ondansetron (ZOFRAN) 8 MG tablet TAKE ONE TABLET BY MOUTH EVERY 8 HOURS AS NEEDED FOR NAUSEA AND VOMITING 90 tablet 0   oxyCODONE (OXY IR/ROXICODONE) 5 MG immediate release tablet Take 1 tablet (5 mg total) by mouth every 6 (six) hours as needed for severe pain. 60 tablet 0   pantoprazole (PROTONIX) 40 MG tablet Take 1 tablet (40 mg total) by mouth daily before breakfast. 30 tablet 2   No current facility-administered medications on file prior to visit.   Allergies  Allergen Reactions   Anesthetics, Amide Hypertension   Benadryl [Diphenhydramine Hcl] Other (See Comments)    Dizziness   Carbocaine [Mepivacaine Hcl] Hypertension   Codeine Other (See Comments)    Dizziness    Epinephrine Hypertension   Sulfa Antibiotics Other (See Comments)    dizziness   Diphenhydramine    Latex Other (See Comments)    Blisters in mouth   Penicillins Rash    Has patient had a PCN reaction causing immediate rash, facial/tongue/throat swelling, SOB or lightheadedness with hypotension: Unknown Has patient had a PCN reaction causing severe rash involving mucus membranes or skin necrosis: Unknown Has patient had a PCN reaction that required hospitalization: Unknown Has patient had a PCN reaction occurring within the last 10 years: Unknown If all of the above answers are "NO", then may proceed with Cephalosporin use.    Tramadol Other (See Comments)    Sedation.    Family History  Problem Relation Age of Onset   Heart disease Brother    Colon cancer Brother    Prostate cancer Brother    PE: BP 110/80 (BP Location: Left Arm, Patient Position: Sitting, Cuff Size: Normal)   Pulse 62   Ht 5\' 4"  (1.626 m)   SpO2 96%   BMI 35.70 kg/m - patient in wheelchair, could not be weighed. Wt Readings from Last 3 Encounters:  10/10/22 208 lb (94.3 kg)  09/23/22 208 lb 1.6 oz (94.4 kg)  03/15/22 220 lb 0.3 oz (99.8 kg)   Constitutional: overweight, in NAD Eyes:  EOMI, no exophthalmos ENT: no neck masses, no cervical lymphadenopathy Cardiovascular: RRR, +1/6 SEM, no RG Respiratory: CTA B Musculoskeletal: no deformities Skin:no rashes Neurological: no tremor with outstretched hands  Assessment: 1. Normocalcemic hyperparathyroidism  2. H/o Vitamin D deficiency  3. Hypothyroidism  Plan: 1. Hyperparathyroidism  -Patient with an interesting history of mostly normal calcium levels in the past with elevated PTH and low vitamin D.  Her PTH levels normalized after normalizing her vitamin D level, however, calcium started to increase.  Her calcitriol, magnesium, phosphorus, were all normal.  She continues on vitamin D supplementation.   -Latest calcium levels have been slightly  elevated: Lab Results  Component Value Date   CALCIUM 10.6 (H) 10/21/2022   CALCIUM 10.8 (H) 10/03/2022   CALCIUM 11.3 (  H) 09/23/2022   CALCIUM 11.2 (H) 08/25/2022  -She had an episode of nephrolithiasis in 2022. No osteoporosis on the bone density scan (33% radius BMD was normal and higher than the rest of the scores on her DXA scan from 08/2017), but she does have a history of pelvic fracture, which qualifies her for clinical osteoporosis.  It is difficult for her to lay on the examiner table to have another DXA scan. -She was on Xgeva for breast cancer bone metastases.  We discussed that this also is lowering calcium levels.  However, reviewing her chart, she is apparently off the medication due to dental work.  Upon questioning, this is complete.  I advised her to discuss with her oncologist, she will be seen next month, to try to restart the Xgeva. -Per her preference, we are following her hypercalcemia clinically and biochemically, but without surgical intervention -See her back in 1 year  2. H/o Severe vitamin D deficiency -Vitamin D level was normal at last check: Lab Results  Component Value Date   VD25OH 45.67 10/18/2021  -She continues vitamin D3 5000 units daily -We will recheck the level now  3. Hypothyroidism - latest thyroid labs reviewed with pt. >> normal: Lab Results  Component Value Date   TSH 2.302 08/18/2021  - she continues on LT4 125 mcg daily - pt feels good on this dose. - we discussed about taking the thyroid hormone every day, with water, >30 minutes before breakfast, separated by >4 hours from acid reflux medications, calcium, iron, multivitamins. Pt. is taking it correctly. - will check thyroid tests today: TSH and fT4 - If labs are abnormal, she will need to return for repeat TFTs in 1.5 months  Component     Latest Ref Rng 10/25/2022  TSH     0.35 - 5.50 uIU/mL 0.17 (L)   T4,Free(Direct)     0.60 - 1.60 ng/dL 1.61 (H)   VITD     09.60 - 100.00 ng/mL  57.87     Vitamin D level is normal, but TSH is suppressed.  Will need to back off the LT4 dose to 112 mcg daily and have her back for labs in 1.5 months.  Carlus Pavlov, MD PhD [facility] Endocrinology

## 2022-10-25 NOTE — Patient Instructions (Addendum)
Please continue vitamin D 5000 units daily.  Please continue Levothyroxine 125 mcg daily.  Take the thyroid hormone every day, with water, at least 30 minutes before breakfast, separated by at least 4 hours from: - acid reflux medications - calcium - iron - multivitamins  Please leave 30 min between the levothyroxine and anything that you eat. Also, other medications.  Please discuss with your oncologist about restarting Xgeva.  Please stop at the lab.  Please come back for a follow-up appointment in 1 year.

## 2022-10-26 ENCOUNTER — Encounter: Payer: Self-pay | Admitting: Nurse Practitioner

## 2022-10-26 ENCOUNTER — Inpatient Hospital Stay (HOSPITAL_BASED_OUTPATIENT_CLINIC_OR_DEPARTMENT_OTHER): Payer: Medicare Other | Admitting: Nurse Practitioner

## 2022-10-26 DIAGNOSIS — Z515 Encounter for palliative care: Secondary | ICD-10-CM | POA: Diagnosis not present

## 2022-10-26 DIAGNOSIS — R110 Nausea: Secondary | ICD-10-CM | POA: Diagnosis not present

## 2022-10-26 DIAGNOSIS — C50411 Malignant neoplasm of upper-outer quadrant of right female breast: Secondary | ICD-10-CM

## 2022-10-26 DIAGNOSIS — Z170 Estrogen receptor positive status [ER+]: Secondary | ICD-10-CM | POA: Diagnosis not present

## 2022-10-26 DIAGNOSIS — M84459A Pathological fracture, hip, unspecified, initial encounter for fracture: Secondary | ICD-10-CM

## 2022-10-26 DIAGNOSIS — C7951 Secondary malignant neoplasm of bone: Secondary | ICD-10-CM | POA: Diagnosis not present

## 2022-10-26 DIAGNOSIS — R530 Neoplastic (malignant) related fatigue: Secondary | ICD-10-CM

## 2022-10-26 DIAGNOSIS — G893 Neoplasm related pain (acute) (chronic): Secondary | ICD-10-CM | POA: Diagnosis not present

## 2022-10-26 MED ORDER — ONDANSETRON HCL 8 MG PO TABS
ORAL_TABLET | ORAL | 0 refills | Status: DC
Start: 2022-10-26 — End: 2022-12-06

## 2022-10-26 MED ORDER — OXYCODONE HCL 5 MG PO TABS
5.0000 mg | ORAL_TABLET | Freq: Four times a day (QID) | ORAL | 0 refills | Status: DC | PRN
Start: 2022-10-26 — End: 2022-11-07

## 2022-10-27 ENCOUNTER — Other Ambulatory Visit: Payer: Self-pay | Admitting: Adult Health

## 2022-10-27 ENCOUNTER — Ambulatory Visit: Payer: Self-pay

## 2022-10-27 ENCOUNTER — Telehealth: Payer: Self-pay

## 2022-10-27 ENCOUNTER — Other Ambulatory Visit: Payer: Self-pay | Admitting: Family Medicine

## 2022-10-27 DIAGNOSIS — I82542 Chronic embolism and thrombosis of left tibial vein: Secondary | ICD-10-CM

## 2022-10-27 DIAGNOSIS — R110 Nausea: Secondary | ICD-10-CM

## 2022-10-27 DIAGNOSIS — Z515 Encounter for palliative care: Secondary | ICD-10-CM

## 2022-10-27 NOTE — Patient Instructions (Signed)
Visit Information  Thank you for taking time to visit with me today. Please don't hesitate to contact me if I can be of assistance to you.   Following are the goals we discussed today:   Goals Addressed             This Visit's Progress    Pain control/ getting into possible assisted living       Patient Goals/Self Care Activities: -Patient/Caregiver will self-administer medications as prescribed as evidenced by self-report/primary caregiver report  -Patient/Caregiver will attend all scheduled provider appointments as evidenced by clinician review of documented attendance to scheduled appointments and patient/caregiver report -Patient/Caregiver will call provider office for new concerns or questions as evidenced by review of documented incoming telephone call notes and patient report   Patient reports doing okay.  [name] continues working with patient advocate to get patient placed.  Patient reports that her son is back right now.  Friends Quincy Sheehan, and [name] are visiting frequently with patient to help with care until she can get to an assisted living facility.  Patient to visit [facility] on [address].   Pain controlled when patient takes her oxycodone.  Patient reports taking medication 4 times a day. She also supplements with tylenol.  Encouraged continued pain control.  She verbalized understanding.             Our next appointment is by telephone on 11/15/22 at 1130 am  Please call the care guide team at [phone] if you need to cancel or reschedule your appointment.   If you are experiencing a Mental Health or Behavioral Health Crisis or need someone to talk to, please call the Suicide and Crisis Lifeline: 988   The patient verbalized understanding of instructions, educational materials, and care plan provided today and DECLINED offer to receive copy of patient instructions, educational materials, and care plan.   The patient has been provided with contact  information for the care management team and has been advised to call with any health related questions or concerns.   Bary Leriche, RN, MSN [facility] Care Management Care Management Coordinator Direct Line [phone]

## 2022-10-27 NOTE — Telephone Encounter (Signed)
-----   Message from Loa Socks, NP sent at 10/25/2022  3:26 PM EDT ----- Chest xray does not show broken rib, but there is something questionably in the lung.  We needc her to go ahead and get the CT chest/abdomen/pelvis scheduled.  Can you clal her and let her know? Thanks, LC ----- Message ----- From: Interface, Rad Results In Sent: 10/21/2022   5:44 PM EDT To: Loa Socks, NP

## 2022-10-27 NOTE — Patient Outreach (Signed)
  Care Coordination   Follow Up Visit Note   10/27/2022 Name: Alicia Vasquez [facility] MRN: [id number] DOB: Mar 21, 1941  Alicia Vasquez is a 82 y.o. year old female who sees Jimmey Ralph, Katina Degree, MD for primary care. I spoke with  Ardith Dark [place] by phone today.  What matters to the patients health and wellness today?  Working to get in assisted living    Goals Addressed             This Visit's Progress    Pain control/ getting into possible assisted living       Patient Goals/Self Care Activities: -Patient/Caregiver will self-administer medications as prescribed as evidenced by self-report/primary caregiver report  -Patient/Caregiver will attend all scheduled provider appointments as evidenced by clinician review of documented attendance to scheduled appointments and patient/caregiver report -Patient/Caregiver will call provider office for new concerns or questions as evidenced by review of documented incoming telephone call notes and patient report   Patient reports doing okay.  [name] continues working with patient advocate to get patient placed.  Patient reports that her son is back right now.  Friends Quincy Sheehan, and [name] are visiting frequently with patient to help with care until she can get to an assisted living facility.  Patient to visit [facility] on [address].   Pain controlled when patient takes her oxycodone.  Patient reports taking medication 4 times a day. She also supplements with tylenol.  Encouraged continued pain control.  She verbalized understanding.             SDOH assessments and interventions completed:  Yes     Care Coordination Interventions:  Yes, provided   Follow up plan: Follow up call scheduled for June    Encounter Outcome:  Pt. Visit Completed   Bary Leriche, RN, MSN [facility] Care Management Care Management Coordinator Direct Line [phone]

## 2022-10-27 NOTE — Telephone Encounter (Signed)
Attempted to call pt to schedule CT CAP. S/w pt's caregiver, Marcelino Duster. Pt was present and on speakerphone in the background. Made pt aware of need for CT CAP per NP. Pt states she will call us back to schedule because she is doing other things.

## 2022-11-01 ENCOUNTER — Telehealth: Payer: Self-pay | Admitting: Pharmacist

## 2022-11-01 NOTE — Progress Notes (Signed)
Care Management & Coordination Services Pharmacy Team  Reason for Encounter: Medication coordination and delivery  Contacted patient to discuss medications and coordinate delivery from [facility]. Spoke with patient on 11/01/2022  Cycle dispensing form sent to [facility] for review.   Last adherence delivery date: 10/11/2022      Patient is due for next adherence delivery on: 11/09/2022  This delivery to include: Vials  30 Days  Lisinopril 10 mg daily Levothyroxine 125 mcg daily Oxycodone 5 mg as needed Pantoprazole 40 mg daily  No refill request needed.  Confirmed delivery date of 11/09/2022, advised patient that pharmacy will contact them the morning of delivery.   Any concerns about your medications? No  How often do you forget or accidentally miss a dose? Rarely  Is patient in packaging No  If yes  What is the date on your next pill pack?  Any concerns or issues with your packaging?   Chart review: Recent office visits:  None  Recent consult visits:  10/26/2022 OV (Oncology) Pickenpack-Cousar, Arty Baumgartner, NP; no medication changes indicated.  10/25/2022 OV (Endo) Carlus Pavlov, MD; no medication changes indicated.  10/21/2022 OV (Oncology) Loa Socks, NP; no medication changes indicated.  10/03/2022 OV (Oncology) Loa Socks, NP; I recommended that she stop taking Coumadin.   Hospital visits:  None in previous 6 months  Medications: Outpatient Encounter Medications as of 11/01/2022  Medication Sig   cholecalciferol (VITAMIN D3) 25 MCG (1000 UNIT) tablet Take 1,000 Units by mouth daily. Current dose 5000 units   levothyroxine (SYNTHROID) 112 MCG tablet Take 1 tablet (112 mcg total) by mouth daily before breakfast.   lisinopril (PRINIVIL) 10 MG tablet Take 1 tablet (10 mg total) by mouth daily.   naloxone (NARCAN) 0.4 MG/ML injection Inject 1 mL (0.4 mg total) into the muscle as needed. (Patient not taking: Reported on 10/10/2022)    ondansetron (ZOFRAN) 8 MG tablet TAKE ONE TABLET BY MOUTH EVERY 8 HOURS AS NEEDED FOR NAUSEA AND VOMITING   oxyCODONE (OXY IR/ROXICODONE) 5 MG immediate release tablet Take 1 tablet (5 mg total) by mouth every 6 (six) hours as needed for severe pain.   pantoprazole (PROTONIX) 40 MG tablet Take 1 tablet (40 mg total) by mouth daily before breakfast.   No facility-administered encounter medications on file as of 11/01/2022.   BP Readings from Last 3 Encounters:  10/25/22 110/80  10/21/22 130/63  10/10/22 122/64    Pulse Readings from Last 3 Encounters:  10/25/22 62  10/21/22 88  10/10/22 87    No results found for: "HGBA1C" Lab Results  Component Value Date   CREATININE 1.01 (H) 10/21/2022   BUN 14 10/21/2022   GFR 59.13 (L) 12/05/2016   GFRNONAA 55 (L) 10/21/2022   GFRAA >60 08/07/2019   NA 139 10/21/2022   K 4.1 10/21/2022   CALCIUM 10.6 (H) 10/21/2022   CO2 28 10/21/2022     Future Appointments  Date Time Provider Department Center  11/10/2022  1:00 PM [facility] PALLIATIVE CARE [facility] None  11/15/2022 11:30 AM Fleeta Emmer, RN [facility] None  11/18/2022  2:30 PM [facility] FLUSH [facility] None  12/16/2022  2:30 PM [facility] FLUSH [facility] None  12/30/2022  2:00 PM Ardith Dark, MD [facility] PEC  01/11/2023  3:00 PM Freddie Breech, DPM [facility]  01/13/2023  2:30 PM [facility] FLUSH [facility] None  02/10/2023  2:30 PM [facility] FLUSH [facility] None  10/16/2023  3:00 PM [facility] ANNUAL WELLNESS VISIT 1  [facility] PEC  11/07/2023  1:00 PM Carlus Pavlov, MD [facility] None   April D Calhoun, [facility] Clinical Pharmacist Assistant [phone]

## 2022-11-07 ENCOUNTER — Other Ambulatory Visit: Payer: Self-pay | Admitting: Nurse Practitioner

## 2022-11-07 DIAGNOSIS — M84459A Pathological fracture, hip, unspecified, initial encounter for fracture: Secondary | ICD-10-CM

## 2022-11-07 DIAGNOSIS — Z515 Encounter for palliative care: Secondary | ICD-10-CM

## 2022-11-07 DIAGNOSIS — C7951 Secondary malignant neoplasm of bone: Secondary | ICD-10-CM

## 2022-11-07 DIAGNOSIS — G893 Neoplasm related pain (acute) (chronic): Secondary | ICD-10-CM

## 2022-11-08 NOTE — Progress Notes (Unsigned)
Palliative Medicine [facility]  [phone] [phone]   Name: Alicia Vasquez Date: 11/08/2022 MRN: [id number]  DOB: [birth year]  Patient Care Team: Ardith Dark, MD as PCP - General (Family Medicine) Carlus Pavlov, MD as Consulting Physician (Internal Medicine) Charlynne Pander, MD as Referring Physician (Internal Medicine) Lenn Sink, DPM as Consulting Physician (Podiatry) Alfredo Martinez, MD as Consulting Physician (Urology) Rachel Moulds, MD as Consulting Physician (Hematology and Oncology) Durene Romans, MD as Consulting Physician (Orthopedic Surgery) Fleeta Emmer, RN as [facility] Care Management   I connected with Ardith Dark Pledger on 11/08/22 at  1:00 PM EDT by phone and verified that I am speaking with the correct person using two identifiers.   I discussed the limitations, risks, security and privacy concerns of performing an evaluation and management service by telemedicine and the availability of in-person appointments. I also discussed with the patient that there may be a patient responsible charge related to this service. The patient expressed understanding and agreed to proceed.   Other persons participating in the visit and their role in the encounter: n/a   Patient's location: home  Provider's location: [facility]   Chief Complaint: f/u of symptom management   INTERVAL HISTORY: Alicia Vasquez is a 82 y.o. female with oncology medical history including right breast cancer ER positive s/p adjuvant radiation (2015), found to have metastatic disease August 2018 s/p left total hip replacement, multiple compression fractures, currently on fulvestrant therapy.  Palliative ask to see for symptom management and goals of care.   SOCIAL HISTORY:     reports that she has never smoked. She has never used smokeless tobacco. She reports that she does not drink alcohol and does not use drugs.  ADVANCE  DIRECTIVES:    CODE STATUS:   PAST MEDICAL HISTORY: 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    Hypertension    Hypothyroid    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   Personal history of radiation therapy    Pneumonia    PONV (postoperative nausea and vomiting)    Radiation 11/21/13-01/07/14   Right Breast/Supraclavicular    ALLERGIES:  is allergic to anesthetics, amide; benadryl [diphenhydramine hcl]; carbocaine [mepivacaine hcl]; codeine; epinephrine; sulfa antibiotics; diphenhydramine; latex; penicillins; and tramadol.  MEDICATIONS:  Current Outpatient Medications  Medication Sig Dispense Refill   cholecalciferol (VITAMIN D3) 25 MCG (1000 UNIT) tablet Take 1,000 Units by mouth daily. Current dose 5000 units     levothyroxine (SYNTHROID) 112 MCG tablet Take 1 tablet (112 mcg total) by mouth daily before breakfast. 45 tablet 5   lisinopril (PRINIVIL) 10 MG tablet Take 1 tablet (10 mg total) by mouth daily. 90 tablet 1   naloxone (NARCAN) 0.4 MG/ML injection Inject 1 mL (0.4 mg total) into the muscle as needed. (Patient not taking: Reported on 10/10/2022) 1 mL 0   ondansetron (ZOFRAN) 8 MG tablet TAKE ONE TABLET BY MOUTH EVERY 8 HOURS AS NEEDED FOR NAUSEA AND VOMITING 90 tablet 0   oxyCODONE (OXY IR/ROXICODONE) 5 MG immediate release tablet Take 1 tablet (5 mg total) by mouth every 6 (six) hours as needed for severe pain. 60 tablet 0   pantoprazole (PROTONIX) 40 MG tablet Take 1 tablet (40  mg total) by mouth daily before breakfast. 30 tablet 2   No current facility-administered medications for this visit.    VITAL SIGNS: There were no vitals taken for this visit. There were no vitals filed for this visit.  Estimated body mass index is 35.7 kg/m as calculated from the following:    Height as of 10/25/22: 5\' 4"  (1.626 m).   Weight as of 10/10/22: 208 lb (94.3 kg).    PERFORMANCE STATUS (ECOG) : 2 - Symptomatic, <50% confined to bed  Impression    Neoplasm related pain Ms. Centner states her pain is mostly controlled on current regimen despite recent strain. We reviewed recent x-ray which did not show any acute abnormalities.   We reviewed her medications. She is taking Tylenol 325mg  twice daily. Advised she make take 2 tablets three times daily for mild aches and pains. Oxycodone 5mg  every 6 hours as needed.   We will continue to support and follow.   Constipation Controlled with stool softeners daily.   I discussed the importance of continued conversation with family and their medical providers regarding overall plan of care and treatment options, ensuring decisions are within the context of the patients values and GOCs.  PLAN:  Oxycodone 5 mg every 6 hours as needed.  Zofran 8 mg every 8 hours as needed for nausea.  Patient states she generally has nausea at least 2-3 days post Faslodex injection Colace twice daily Tylenol 650mg  three times daily Protonix 40 mg daily  Ongoing symptom management and support as needed I will plan to see patient back in 3-4 weeks in collaboration with other oncology appointments.    Patient expressed understanding and was in agreement with this plan. She also understands that She can call the clinic at any time with any questions, concerns, or complaints.    Any controlled substances utilized were prescribed in the context of palliative care. PDMP has been reviewed.    Visit consisted of counseling and education dealing with the complex and emotionally intense issues of symptom management and palliative care in the setting of serious and potentially life-threatening illness.Greater than 50%  of this time was spent counseling and coordinating care related to the above assessment and plan.  [name], AGPCNP-BC   Palliative Medicine Team/[facility]  *Please note that this is a verbal dictation therefore any spelling or grammatical errors are due to the "Dragon Medical One" system interpretation.

## 2022-11-09 ENCOUNTER — Telehealth: Payer: Self-pay | Admitting: Licensed Clinical Social Worker

## 2022-11-09 ENCOUNTER — Telehealth: Payer: Self-pay

## 2022-11-09 NOTE — Telephone Encounter (Signed)
Returned Pt's call regarding palliative care appt. Pt tearful stating that someone was supposed to call her today at 1300. Advised Pt that her telephone appt with palliative care is scheduled for tomorrow (11/09/22) at 1300. Pt also stating that she is"being kicked out" of current living situation and is "being sued for missed rent payments". Secure chat sent to SW, palliative NP, and [facility] (Jazmine Lewis and [employer]) to update on Pt's situation. Pt verbalized understanding.  Per SW, there is no emergency housing.  [facility] reports: "Yes, I had spoken with Lindalou Hose, at [employer] who had agreed to take [facility] to local facilities to assist with possible placement. I can reach out to them today.There was another APS case opened, as well."

## 2022-11-09 NOTE — Patient Outreach (Addendum)
  Care Coordination   Documentation  Visit Note   11/09/2022 Name: Alicia Vasquez [facility] MRN: [id number] DOB: Apr 23, 1941  Alicia Vasquez is a 82 y.o. year old female who sees Alicia Vasquez, Alicia Degree, MD for primary care. I  spoke with Alicia Lucks, LCSW.  Patient has to move from hotel due to non-payment.  Discussed needing FL-[place].  Alicia Vasquez to make appointment with PCP. Advised that CM would call several assisted living facilities. -CM spoke with [facility]- they do have a bed-private pay $3000 per month.  Called [facility] and [facility]-No availability.  Notified LCSW, Alicia Vasquez of status of bed availability.    What matters to the patients health and wellness today?  N/A    Goals Addressed   None     SDOH assessments and interventions completed:  No     Care Coordination Interventions:  Yes, provided   Follow up plan: Follow up call scheduled for 11/15/22    Encounter Outcome:  Pt. Visit Completed   Alicia Leriche, RN, MSN [facility] Care Management Care Management Coordinator Direct Line [phone]

## 2022-11-10 ENCOUNTER — Encounter: Payer: Self-pay | Admitting: Licensed Clinical Social Worker

## 2022-11-10 ENCOUNTER — Inpatient Hospital Stay: Payer: Medicare Other | Attending: Oncology | Admitting: Nurse Practitioner

## 2022-11-10 ENCOUNTER — Telehealth: Payer: Self-pay | Admitting: Nurse Practitioner

## 2022-11-10 DIAGNOSIS — C50411 Malignant neoplasm of upper-outer quadrant of right female breast: Secondary | ICD-10-CM | POA: Insufficient documentation

## 2022-11-10 DIAGNOSIS — Z170 Estrogen receptor positive status [ER+]: Secondary | ICD-10-CM | POA: Insufficient documentation

## 2022-11-10 DIAGNOSIS — Z5111 Encounter for antineoplastic chemotherapy: Secondary | ICD-10-CM | POA: Insufficient documentation

## 2022-11-10 NOTE — Telephone Encounter (Signed)
Multiple attempts throughout the day to reach patient for scheduled telephone visit. No answer. Voicemail was left.

## 2022-11-10 NOTE — Patient Outreach (Addendum)
  Care Coordination   Multidisciplinary Case Review Note    11/10/2022 Name: Alicia Vasquez [facility] MRN: [id number] DOB: 19-May-1941  Alicia Vasquez is a 82 y.o. year old female who sees Jimmey Ralph, Katina Degree, MD for primary care.  The  multidisciplinary care team met today to review patient care needs and barriers.  Patient bill to hotel not being paid as patient thought. Patient currently owes hotel $6000, facing eviction, and has court case pending.  Team discussed possible options: APS referral and placement for patient.  LCSW will follow up with APS and coordinate with external agencies to provide structured support for patient.     Goals Addressed   None     SDOH assessments and interventions completed:  Yes     Care Coordination Interventions Activated:  Yes    Care Coordination Interventions:  Yes, provided [name], LCSW, Doristine Section Florance RN, Fleeta Emmer, RN  Follow up plan:  LCSW to contact APS.     Multidisciplinary Team Attendees:   [name], LCSW, Antionette Fairy, RN and Fleeta Emmer, RN  Scribe for Multidisciplinary Case Review:  Fleeta Emmer, RN  Bary Leriche, RN, MSN [facility] Care Management Care Management Coordinator Direct Line [phone]

## 2022-11-11 ENCOUNTER — Encounter: Payer: Self-pay | Admitting: Licensed Clinical Social Worker

## 2022-11-11 NOTE — Patient Outreach (Signed)
  Care Coordination   Follow Up Visit Note   11/10/2022 Name: [name] [facility] MRN: [id number] DOB: 04/27/1941  Alicia Vasquez is a 82 y.o. year old female who sees Jimmey Ralph, Katina Degree, MD for primary care. I  engaged with community agency  What matters to the patients health and wellness today?  Pt was not engaged during this encounter   SDOH assessments and interventions completed:  No     Care Coordination Interventions:  Yes, provided  Interventions Today    Flowsheet Row Most Recent Value  General Interventions   General Interventions Discussed/Reviewed Community Resources  [employer] collaborated with community agency to assist with assessing concerns regarding financial strain, limited support, and inability to independently complete ADLs]       Follow up plan: Follow up call scheduled for 1 week    Encounter Outcome:  Pt. Visit Completed   [name], MSW, LCSW [facility] Care Management [facility] Health  [facility] [email or web address] Phone [phone] 6:25 AM

## 2022-11-11 NOTE — Patient Outreach (Signed)
  Care Coordination   Follow Up Visit Note   11/09/2022 Name: Alicia Vasquez [facility] MRN: [id number] DOB: [birth year]  Alicia Vasquez is a 82 y.o. year old female who sees Jimmey Ralph, Katina Degree, MD for primary care. I spoke with  Alicia Vasquez by phone today.  What matters to the patients health and wellness today?  Housing and Level of Care concerns    Goals Addressed             This Visit's Progress    Obtain Stable and Safe Housing   On track    Activities and task to complete in order to accomplish goals.   Schedule transportation through Cancer center for their upcoming appts. Utilize [facility] [place] for additional transportation needs Patient will comply with meds prescribed by doctors Continue working with [facility] to reach identified goals Obtain info on APS worker, to provide to LCSW, per pt request               SDOH assessments and interventions completed:  No     Care Coordination Interventions:  Yes, provided   Follow up plan: Follow up call scheduled for within a week    Encounter Outcome:  Pt. Visit Completed   [name], MSW, LCSW [facility] Care Management [facility] Health  [facility] [email or web address] Phone [phone] 5:37 AM

## 2022-11-11 NOTE — Patient Instructions (Signed)
Visit Information  Thank you for taking time to visit with me today. Please don't hesitate to contact me if I can be of assistance to you.   Following are the goals we discussed today:   Goals Addressed             This Visit's Progress    Obtain Stable and Safe Housing   On track    Activities and task to complete in order to accomplish goals.   Schedule transportation through Cancer center for their upcoming appts. Utilize [facility] [place] for additional transportation needs Patient will comply with meds prescribed by doctors Continue working with [facility] to reach identified goals Obtain info on APS worker, to provide to LCSW, per pt request              Please call the care guide team at [phone] if you need to cancel or reschedule your appointment.   If you are experiencing a Mental Health or Behavioral Health Crisis or need someone to talk to, please call the Suicide and Crisis Lifeline: 988 call 911   The patient verbalized understanding of instructions, educational materials, and care plan provided today and DECLINED offer to receive copy of patient instructions, educational materials, and care plan.   [name], MSW, LCSW [facility] Care Management [place]  [facility] [email or web address] Phone [phone] 5:38 AM

## 2022-11-14 ENCOUNTER — Ambulatory Visit: Payer: Medicare Other | Admitting: Internal Medicine

## 2022-11-14 ENCOUNTER — Telehealth: Payer: Self-pay | Admitting: Family Medicine

## 2022-11-14 NOTE — Telephone Encounter (Signed)
Alicia Vasquez, Access nurse states that final outcome is to see PCP within 4 hours. Patient has been scheduled with Dr. Philip Aspen @ 1:30pm. Awaiting triage note.

## 2022-11-14 NOTE — Telephone Encounter (Signed)
Caller states patient will not be able to make the 1:30 appointment due to lack of transportation. States that it should be another day since she isn't sure if patient could make it at any time today. Please Advise.

## 2022-11-14 NOTE — Telephone Encounter (Signed)
Noted  

## 2022-11-14 NOTE — Telephone Encounter (Signed)
Final Outcome: See HCP within 4 hours. See previous telephone note for update.   Patient Name First: [facility] Last: [place] Gender: Female DOB: July 05, 1940 Age: 82 Y 4 M 2 D Return Phone Number: [phone] (Primary) Address: City/ State/ Zip: [place]  [address] Client [facility] - Administrator, [facility] at [facility] Provider Jacquiline Doe- MD Contact Type Call Who Is Calling Patient / Member / Family / Caregiver Call Type Triage / Clinical Relationship To Patient Self Return Phone Number [phone] (Primary) Chief Complaint Leg Pain Reason for Call Symptomatic / Request for Health Information Initial Comment Caller states she is having leg pain and swelling for 4 days. She does have a history of blood clots and is no longer on warfarin. Translation No Nurse Assessment Nurse: Thana Farr, RN, Ladona Ridgel Date/Time Lamount Cohen Time): 11/14/2022 10:34:55 AM Confirm and document reason for call. If symptomatic, describe symptoms. ---Leg pain and swelling X 4 days. Swelling in both feet and one knee. Current pain level 8/10. Pain medication last taken 9:30am. Denies redness, discoloration, fever, SOB, chest pain, or any other symptoms at this time. Does the patient have any new or worsening symptoms? ---Yes Will a triage be completed? ---Yes Related visit to physician within the last 2 weeks? ---No Does the PT have any chronic conditions? (i.e. diabetes, asthma, this includes High risk factors for pregnancy, etc.) ---Yes List chronic conditions. ---HTN Is this a behavioral health or substance abuse call? ---No Guidelines Guideline Title Affirmed Question Affirmed Notes Nurse Date/Time Lamount Cohen Time) Leg Swelling and Edema SEVERE leg swelling (e.g., swelling extends above knee, entire leg [facility] 11/14/2022 10:37:16 AM PLEASE NOTE: All timestamps contained within this report are represented as [place]  Standard Time. CONFIDENTIALTY NOTICE: This fax transmission is intended only for the addressee. It contains information that is legally privileged, confidential or otherwise protected from use or disclosure. If you are not the intended recipient, you are strictly prohibited from reviewing, disclosing, copying using or disseminating any of this information or taking any action in reliance on or regarding this information. If you have received this fax in error, please notify us immediately by telephone so that we can arrange for its return to [name]. Phone: [phone], Toll-Free: [phone], Fax: [phone] Page: 2 of 2 Call Id: [id number] Guidelines Guideline Title Affirmed Question Affirmed Notes Nurse Date/Time Lamount Cohen Time) is swollen, weeping fluid) Disp. Time Lamount Cohen Time) Disposition Final User 11/14/2022 10:39:12 AM See HCP within 4 Hours (or PCP triage) Yes Thana Farr, RN, Ladona Ridgel Final Disposition 11/14/2022 10:39:12 AM See HCP within 4 Hours (or PCP triage) Yes Thana Farr, RN, Ursula Beath Disagree/Comply Comply Caller Understands Yes PreDisposition Call Doctor Care Advice Given Per Guideline * IF OFFICE WILL BE OPEN: You need to be seen within the next 3 or 4 hours. Call your doctor (or NP/PA) now or as soon as the office opens. SEE HCP (OR PCP TRIAGE) WITHIN 4 HOURS: CALL BACK IF: Comments User: Meade Maw, RN Date/Time (Eastern Time): 11/14/2022 10:42:03 AM Contacted back line @ [phone] and warm transferred successfully for an appt. Referrals REFERRED TO PCP OFFICE

## 2022-11-14 NOTE — Patient Outreach (Signed)
  Care Coordination   Follow Up Visit Note   11/11/2022 Name: Alicia Vasquez [facility] MRN: [id number] DOB: 01/12/1941  Alicia Vasquez is a 82 y.o. year old female who sees Jimmey Ralph, Katina Degree, MD for primary care. I  engaged with pt's POA and caregiver  What matters to the patients health and wellness today?  Patient was not engaged during this encounter    Goals Addressed             This Visit's Progress    Obtain Stable and Safe Housing   On track    Activities and task to complete in order to accomplish goals.   Schedule transportation through Cancer center for their upcoming appts. Utilize [facility] [place] for additional transportation needs Patient will comply with meds prescribed by doctors Continue working with [facility] to reach identified goals Obtain info on APS worker, to provide to LCSW, per pt request               SDOH assessments and interventions completed:  No     Care Coordination Interventions:  Yes, provided  Interventions Today    Flowsheet Row Most Recent Value  General Interventions   General Interventions Discussed/Reviewed General Interventions Reviewed  Mental Health Interventions   Mental Health Discussed/Reviewed Coping Strategies, Mental Health Reviewed  [Pt's POA and Caregiver reported multiple concerns regarding providing ongoing care and management of funds for patient. LCSW assessed for safety and completed referral to assist with strenghtening pt's support. Welfare request also completed]  Safety Interventions   Safety Discussed/Reviewed Safety Reviewed, Home Safety  [Law enforcement verified that they "laid eyes" on patient. Noted that she kept the door partially open,  therefore, there was no assessment of home. Pt did not disclose to LE any concerns for immediate safety]  Home Safety Refer for community resources       Follow up plan: Follow up call scheduled for 1 week    Encounter Outcome:  Pt. Visit Completed   [name], MSW,  LCSW [facility] Care Management [facility] Health  [facility] [email or web address] Phone [phone] 5:14 PM

## 2022-11-14 NOTE — Telephone Encounter (Signed)
FYI: Patient's friend, [name] called in stating pt was having leg swelling and pain x 4 days. Currently off of warfarin and has hx of blood clots.I was able to call patient and she confirmed symptoms.  Patient called in with the following symptoms:  Red Word: Leg swelling and pain x 4 days   Please advise at Mobile [phone] (mobile)  Message is routed to Provider Pool and [facility] Triage

## 2022-11-15 ENCOUNTER — Telehealth: Payer: Self-pay

## 2022-11-15 ENCOUNTER — Emergency Department (HOSPITAL_BASED_OUTPATIENT_CLINIC_OR_DEPARTMENT_OTHER)
Admit: 2022-11-15 | Discharge: 2022-11-15 | Disposition: A | Discharge status: Home / Self Care | Payer: Medicare Other | Attending: Emergency Medicine | Admitting: Emergency Medicine

## 2022-11-15 ENCOUNTER — Encounter (HOSPITAL_COMMUNITY): Payer: Self-pay | Admitting: Emergency Medicine

## 2022-11-15 ENCOUNTER — Encounter: Payer: Medicare Other | Admitting: Physician Assistant

## 2022-11-15 ENCOUNTER — Ambulatory Visit: Payer: Self-pay

## 2022-11-15 ENCOUNTER — Other Ambulatory Visit: Payer: Self-pay

## 2022-11-15 ENCOUNTER — Emergency Department (HOSPITAL_COMMUNITY)
Admission: EM | Admit: 2022-11-15 | Discharge: 2022-11-15 | Disposition: A | Discharge status: Home / Self Care | Payer: Medicare Other | Attending: Emergency Medicine | Admitting: Emergency Medicine

## 2022-11-15 ENCOUNTER — Emergency Department (HOSPITAL_COMMUNITY): Payer: Medicare Other

## 2022-11-15 DIAGNOSIS — E669 Obesity, unspecified: Secondary | ICD-10-CM | POA: Diagnosis not present

## 2022-11-15 DIAGNOSIS — I10 Essential (primary) hypertension: Secondary | ICD-10-CM | POA: Diagnosis not present

## 2022-11-15 DIAGNOSIS — E039 Hypothyroidism, unspecified: Secondary | ICD-10-CM | POA: Diagnosis not present

## 2022-11-15 DIAGNOSIS — W19XXXA Unspecified fall, initial encounter: Secondary | ICD-10-CM | POA: Diagnosis not present

## 2022-11-15 DIAGNOSIS — R079 Chest pain, unspecified: Secondary | ICD-10-CM | POA: Insufficient documentation

## 2022-11-15 DIAGNOSIS — R5383 Other fatigue: Secondary | ICD-10-CM | POA: Diagnosis not present

## 2022-11-15 DIAGNOSIS — Z91040 Latex allergy status: Secondary | ICD-10-CM | POA: Diagnosis not present

## 2022-11-15 DIAGNOSIS — M25569 Pain in unspecified knee: Secondary | ICD-10-CM | POA: Diagnosis not present

## 2022-11-15 DIAGNOSIS — Z853 Personal history of malignant neoplasm of breast: Secondary | ICD-10-CM | POA: Insufficient documentation

## 2022-11-15 DIAGNOSIS — Z7401 Bed confinement status: Secondary | ICD-10-CM | POA: Diagnosis not present

## 2022-11-15 DIAGNOSIS — R52 Pain, unspecified: Secondary | ICD-10-CM | POA: Diagnosis not present

## 2022-11-15 LAB — CBC WITH DIFFERENTIAL/PLATELET
Abs Immature Granulocytes: 0.02 10*3/uL (ref 0.00–0.07)
Basophils Absolute: 0.1 10*3/uL (ref 0.0–0.1)
Basophils Relative: 1 %
Eosinophils Absolute: 0.2 10*3/uL (ref 0.0–0.5)
Eosinophils Relative: 3 %
HCT: 36.6 % (ref 36.0–46.0)
Hemoglobin: 11.5 g/dL — ABNORMAL LOW (ref 12.0–15.0)
Immature Granulocytes: 0 %
Lymphocytes Relative: 18 %
Lymphs Abs: 1.4 10*3/uL (ref 0.7–4.0)
MCH: 28.1 pg (ref 26.0–34.0)
MCHC: 31.4 g/dL (ref 30.0–36.0)
MCV: 89.5 fL (ref 80.0–100.0)
Monocytes Absolute: 1.2 10*3/uL — ABNORMAL HIGH (ref 0.1–1.0)
Monocytes Relative: 16 %
Neutro Abs: 4.9 10*3/uL (ref 1.7–7.7)
Neutrophils Relative %: 62 %
Platelets: 318 10*3/uL (ref 150–400)
RBC: 4.09 MIL/uL (ref 3.87–5.11)
RDW: 14.8 % (ref 11.5–15.5)
WBC: 7.8 10*3/uL (ref 4.0–10.5)
nRBC: 0 % (ref 0.0–0.2)

## 2022-11-15 LAB — TROPONIN I (HIGH SENSITIVITY)
Troponin I (High Sensitivity): 6 ng/L (ref <18)
Troponin I (High Sensitivity): 7 ng/L (ref <18)

## 2022-11-15 LAB — COMPREHENSIVE METABOLIC PANEL
ALT: 20 U/L (ref 0–44)
AST: 21 U/L (ref 15–41)
Albumin: 3.3 g/dL — ABNORMAL LOW (ref 3.5–5.0)
Alkaline Phosphatase: 109 U/L (ref 38–126)
Anion gap: 6 (ref 5–15)
BUN: 16 mg/dL (ref 8–23)
CO2: 26 mmol/L (ref 22–32)
Calcium: 10.7 mg/dL — ABNORMAL HIGH (ref 8.9–10.3)
Chloride: 106 mmol/L (ref 98–111)
Creatinine, Ser: 0.94 mg/dL (ref 0.44–1.00)
GFR, Estimated: >60 mL/min (ref >60)
Glucose, Bld: 120 mg/dL — ABNORMAL HIGH (ref 70–99)
Potassium: 3.7 mmol/L (ref 3.5–5.1)
Sodium: 138 mmol/L (ref 135–145)
Total Bilirubin: 0.7 mg/dL (ref 0.3–1.2)
Total Protein: 6.8 g/dL (ref 6.5–8.1)

## 2022-11-15 MED ORDER — OXYCODONE HCL 5 MG PO TABS
5.0000 mg | ORAL_TABLET | Freq: Once | ORAL | Status: AC
  Administered 2022-11-15: 5 mg via ORAL
  Filled 2022-11-15: qty 1

## 2022-11-15 NOTE — Telephone Encounter (Signed)
Pt called reporting bilateral swelling and redness in her legs, worse on her left leg, with pain in her left leg and a knot behind the knee. this has been going on for 5-6 days now with the bilateral swelling worsening. pt is not SOB, no chest pain or dizziness. Jae Dire, [place] made aware and RN attempted to get pt scheduled to see [facility] and transportation set up. Unfortunately transportation for non-ambulatory patients needs to be set up 48 hours in advance, explained this to pt as well as the need to be seen sooner than that, since this issue has been ongoing and worsening for 5 days. Pt recommended to go to ED, RN discussed at length with the pt the risks of not going to the ED, and why that was the recommendation. Pt verbalized understanding and pt to call EMS after phone call with this RN. No further needs at this time.

## 2022-11-15 NOTE — ED Provider Notes (Signed)
Received patient in turnover from Dr. Criss Alvine.  Please see their note for further details of Hx, PE.  Briefly patient is a 82 y.o. female with a No chief complaint on file. .  82 year old female sent from the cancer center to be evaluated for possible DVT.  This was found to be negative however the patient was seen clutching her chest and complaining of chest discomfort and so chest pain workup was initiated as well.  Plan for 2 troponins as the onset was while she was here in the emergency department.  Second troponin is negative.  Will discharge home.  PCP follow-up.    Melene Plan, DO 11/15/22 1738

## 2022-11-15 NOTE — Telephone Encounter (Signed)
See note

## 2022-11-15 NOTE — Progress Notes (Signed)
Left lower extremity venous duplex has been completed. Preliminary results can be found in CV Proc through chart review.  Results were given to Dr. Criss Alvine.  11/15/22 2:48 PM Olen Cordial RVT

## 2022-11-15 NOTE — Discharge Instructions (Signed)
Blood clot in your leg.  Your workup did not show that you are having an acute heart attack.  Please follow-up with your family doctor in the office.

## 2022-11-15 NOTE — Patient Outreach (Signed)
  Care Coordination   Follow Up Visit Note   11/15/2022 Name: Alicia Vasquez [facility] MRN: [id number] DOB: [birth year]  Alicia Vasquez is a 82 y.o. year old female who sees Alicia Vasquez, Alicia Degree, MD for primary care. I spoke with  Alicia Vasquez by phone today. Patient continues to reside in hotel. Patient reports some APS involvement but APS could not get in. Patient reluctant to work with APS.  Patient owes money to hotel and has court case that has been continued per patient by her lawyer.  Discussed with patient that she needs to be in a facility.  She states that her friend Alicia Vasquez is working on that.   What matters to the patients health and wellness today?  Pain control    Goals Addressed             This Visit's Progress    Pain control/ getting into possible assisted living       Patient Goals/Self Care Activities: -Patient/Caregiver will self-administer medications as prescribed as evidenced by self-report/primary caregiver report  -Patient/Caregiver will attend all scheduled provider appointments as evidenced by clinician review of documented attendance to scheduled appointments and patient/caregiver report -Patient/Caregiver will call provider office for new concerns or questions as evidenced by review of documented incoming telephone call notes and patient report   Patient reports doing okay. Still  residing at hotel. Alicia Vasquez continues working with patient advocate to get patient placed.  Patient reports that her son is back right now.   Patient has continued court date by her attorney as she owes the hotel money.  Pain controlled when patient takes her oxycodone.  Patient reports taking medication 4 times a day. She also supplements with tylenol.  Encouraged continued pain control.  She verbalized understanding.    Patient reports knot, pain and swelling to left leg. Patient declines to go to the ER.  She reports she could not get to the physician on yesterday. Reiterated that she  needs to go to the ED.            SDOH assessments and interventions completed:  Yes     Care Coordination Interventions:  Yes, provided   Follow up plan: Follow up call scheduled for July    Encounter Outcome:  Pt. Visit Completed   Bary Leriche, RN, MSN [facility] Care Management Care Management Coordinator Direct Line [phone]

## 2022-11-15 NOTE — Patient Instructions (Signed)
Visit Information  Thank you for taking time to visit with me today. Please don't hesitate to contact me if I can be of assistance to you.   Following are the goals we discussed today:   Goals Addressed             This Visit's Progress    Pain control/ getting into possible assisted living       Patient Goals/Self Care Activities: -Patient/Caregiver will self-administer medications as prescribed as evidenced by self-report/primary caregiver report  -Patient/Caregiver will attend all scheduled provider appointments as evidenced by clinician review of documented attendance to scheduled appointments and patient/caregiver report -Patient/Caregiver will call provider office for new concerns or questions as evidenced by review of documented incoming telephone call notes and patient report   Patient reports doing okay. Still  residing at hotel. [name] continues working with patient advocate to get patient placed.  Patient reports that her son is back right now.   Patient has continued court date by her attorney as she owes the hotel money.  Pain controlled when patient takes her oxycodone.  Patient reports taking medication 4 times a day. She also supplements with tylenol.  Encouraged continued pain control.  She verbalized understanding.    Patient reports knot, pain and swelling to left leg. Patient declines to go to the ER.  She reports she could not get to the physician on yesterday. Reiterated that she needs to go to the ED.            Our next appointment is by telephone on 12/07/22 at 1030 am  Please call the care guide team at [phone] if you need to cancel or reschedule your appointment.   If you are experiencing a Mental Health or Behavioral Health Crisis or need someone to talk to, please call the Suicide and Crisis Lifeline: 988   The patient verbalized understanding of instructions, educational materials, and care plan provided today and DECLINED offer to receive copy of  patient instructions, educational materials, and care plan.   The patient has been provided with contact information for the care management team and has been advised to call with any health related questions or concerns.   Bary Leriche, RN, MSN [facility] Care Management Care Management Coordinator Direct Line [phone]

## 2022-11-15 NOTE — ED Provider Notes (Signed)
[place] EMERGENCY DEPARTMENT AT [facility] Provider Note   CSN: [id number] Arrival date & time: 11/15/22  1340     History  No chief complaint on file.   Alicia Vasquez is a 82 y.o. female.  HPI 82 year old female presents with a chief complaint of needing to rule out a DVT.  She has multiple comorbidities which include a prior DVT, hypothyroidism, hypertension, metastatic breast cancer, previous hip fracture.  She has been dealing with chronic hip and knee pain on the left side.  Family also provides history.  However over the past several weeks she has been feeling a "lump" in her left leg.  Seems to be anterior but also posterior.  She otherwise has been having chronic hip and knee pain but these are different than normal.  She denies any fevers.  She does not have a current chest pain but she, family, and the nurse endorse that she had some transient chest pain when she was first being transferred into the ER stretcher. Is no longer having chest pain. No current dyspnea.  Feels like her left ankle might be a little swollen but otherwise not swollen.  Was taken off warfarin a few weeks ago. Is on oxycodone for pain, last took around 9 am.  Home Medications Prior to Admission medications   Medication Sig Start Date End Date Taking? Authorizing Provider  cholecalciferol (VITAMIN D3) 25 MCG (1000 UNIT) tablet Take 1,000 Units by mouth daily. Current dose 5000 units    [provider]  levothyroxine (SYNTHROID) 112 MCG tablet Take 1 tablet (112 mcg total) by mouth daily before breakfast. 10/25/22   Carlus Pavlov, MD  lisinopril (PRINIVIL) 10 MG tablet Take 1 tablet (10 mg total) by mouth daily. 09/02/22   Ardith Dark, MD  naloxone [facility]) 0.4 MG/ML injection Inject 1 mL (0.4 mg total) into the muscle as needed. Patient not taking: Reported on 10/10/2022 03/17/22   Marinda Elk, MD  ondansetron (ZOFRAN) 8 MG tablet TAKE ONE TABLET BY MOUTH EVERY 8 HOURS  AS NEEDED FOR NAUSEA AND VOMITING 10/26/22   Pickenpack-Cousar, Arty Baumgartner, NP  oxyCODONE (OXY IR/ROXICODONE) 5 MG immediate release tablet Take 1 tablet (5 mg total) by mouth every 6 (six) hours as needed for severe pain. 11/07/22   Pickenpack-Cousar, Arty Baumgartner, NP  pantoprazole (PROTONIX) 40 MG tablet Take 1 tablet (40 mg total) by mouth daily before breakfast. 09/23/22   Pickenpack-Cousar, Arty Baumgartner, NP      Allergies    Anesthetics, amide; Benadryl [diphenhydramine hcl]; Carbocaine [mepivacaine hcl]; Codeine; Epinephrine; Sulfa antibiotics; Diphenhydramine; Latex; Penicillins; and Tramadol    Review of Systems   Review of Systems  Respiratory:  Negative for shortness of breath.   Cardiovascular:  Positive for chest pain and leg swelling.  Gastrointestinal:  Negative for abdominal pain.  Musculoskeletal:  Positive for arthralgias.    Physical Exam Updated Vital Signs BP (!) 150/69   Pulse 90   Temp 97.6 F (36.4 C) (Oral)   Resp 15   SpO2 96%  Physical Exam Vitals and nursing note reviewed.  Constitutional:      Appearance: She is well-developed. She is obese.  HENT:     Head: Normocephalic and atraumatic.  Cardiovascular:     Rate and Rhythm: Regular rhythm. Tachycardia present.     Pulses:          Dorsalis pedis pulses are 2+ on the right side and 2+ on the left side.  Heart sounds: Normal heart sounds.     Comments: HR~100 Pulmonary:     Effort: Pulmonary effort is normal.     Breath sounds: Normal breath sounds.  Abdominal:     Palpations: Abdomen is soft.     Tenderness: There is no abdominal tenderness.  Musculoskeletal:     Left hip: Normal range of motion.     Left knee: No swelling or effusion. Normal range of motion. Tenderness present.     Right lower leg: No swelling. No edema.     Left lower leg: Tenderness present. No swelling. No edema.     Right ankle: No swelling.     Left ankle: No swelling.     Comments: Patient appears to have diffuse left leg  tenderness, anterior and posterior, including lower leg and thigh. No bony tenderness. No decreased ROM of knee/hip  Skin:    General: Skin is warm and dry.  Neurological:     Mental Status: She is alert.     ED Results / Procedures / Treatments   Labs (all labs ordered are listed, but only abnormal results are displayed) Labs Reviewed  COMPREHENSIVE METABOLIC PANEL - Abnormal; Notable for the following components:      Result Value   Glucose, Bld 120 (*)    Calcium 10.7 (*)    Albumin 3.3 (*)    All other components within normal limits  CBC WITH DIFFERENTIAL/PLATELET - Abnormal; Notable for the following components:   Hemoglobin 11.5 (*)    Monocytes Absolute 1.2 (*)    All other components within normal limits  TROPONIN I (HIGH SENSITIVITY)    EKG EKG Interpretation  Date/Time:  Tuesday November 15 2022 14:30:11 EDT Ventricular Rate:  91 PR Interval:  168 QRS Duration: 78 QT Interval:  337 QTC Calculation: 415 R Axis:   -19 Text Interpretation: Sinus rhythm Borderline left axis deviation Low voltage, precordial leads no acute ST/T changes Confirmed by Pricilla Loveless [phone]) on 11/15/2022 2:37:15 PM  Radiology VAS [name] LOWER EXTREMITY VENOUS (DVT) (7a-7p)  Result Date: 11/15/2022  Lower Venous DVT Study Patient Name:  Alicia Vasquez [facility]  Date of Exam:   11/15/2022 Medical Rec #: [id number]            Accession #:    [id number] Date of Birth: 04-Aug-1940            Patient Gender: F Patient Age:   24 years Exam Location:  [facility] Procedure:      VAS [name] LOWER EXTREMITY VENOUS (DVT) Referring Phys: Lorin Picket Tennelle Taflinger --------------------------------------------------------------------------------  Indications: Pain.  Risk Factors: None identified. Limitations: Poor ultrasound/tissue interface, body habitus and patient pain tolerance. Comparison Study: No prior studies. Performing Technologist: Chanda Busing RVT  Examination Guidelines: A complete evaluation includes B-mode  imaging, spectral Doppler, color Doppler, and power Doppler as needed of all accessible portions of each vessel. Bilateral testing is considered an integral part of a complete examination. Limited examinations for reoccurring indications may be performed as noted. The reflux portion of the exam is performed with the patient in reverse Trendelenburg.  +-----+---------------+---------+-----------+----------+--------------+ RIGHTCompressibilityPhasicitySpontaneityPropertiesThrombus Aging +-----+---------------+---------+-----------+----------+--------------+ CFV  Full           Yes      Yes                                 +-----+---------------+---------+-----------+----------+--------------+   +---------+---------------+---------+-----------+----------+-------------------+ LEFT     CompressibilityPhasicitySpontaneityPropertiesThrombus Aging      +---------+---------------+---------+-----------+----------+-------------------+  CFV      Full           Yes      Yes                                      +---------+---------------+---------+-----------+----------+-------------------+ SFJ      Full                                                             +---------+---------------+---------+-----------+----------+-------------------+ FV Prox  Full                                                             +---------+---------------+---------+-----------+----------+-------------------+ FV Mid                  Yes      Yes                                      +---------+---------------+---------+-----------+----------+-------------------+ FV Distal               Yes      Yes                                      +---------+---------------+---------+-----------+----------+-------------------+ PFV      Full                                                             +---------+---------------+---------+-----------+----------+-------------------+ POP      Full            Yes      Yes                                      +---------+---------------+---------+-----------+----------+-------------------+ PTV      Full                                                             +---------+---------------+---------+-----------+----------+-------------------+ PERO                                                  Not well visualized +---------+---------------+---------+-----------+----------+-------------------+    Summary: RIGHT: - No evidence of common femoral vein obstruction.  LEFT: - There is no evidence of deep vein thrombosis in the lower  extremity. However, portions of this examination were limited- see technologist comments above.  - No cystic structure found in the popliteal fossa.  *See table(s) above for measurements and observations.    Preliminary     Procedures Procedures    Medications Ordered in ED Medications  oxyCODONE (Oxy IR/ROXICODONE) immediate release tablet 5 mg (5 mg Oral Given 11/15/22 1429)    ED Course/ Medical Decision Making/ A&P                             Medical Decision Making Amount and/or Complexity of Data Reviewed Labs: ordered. Radiology: ordered.  Risk Prescription drug management.   DVT study is negative.  However patient did have some transient chest pain witnessed by family and nurse.  Given this we will get troponins but my suspicion for ACS is fairly low.  Also have a low suspicion for PE.  Especially in the setting of a negative DVT study.  Will give oxycodone for her chronic pain.  However no new pain, falls, and a very low suspicion for septic arthritis in either her hip or knee. Care transferred to Dr. Adela Lank.        Final Clinical Impression(s) / ED Diagnoses Final diagnoses:  None    Rx / DC Orders ED Discharge Orders     None         Pricilla Loveless, MD 11/15/22 1540

## 2022-11-15 NOTE — ED Triage Notes (Signed)
Patient presents from home due to a painful lump in the left leg. The [facility] was concerned she may have a blood clot and suggested she come here. She has a history of blood clots 5 years ago.     EMS vitals: 142/86 BP 104 HR 20 RR 96 % SPO2 on room air 126 CBG

## 2022-11-17 ENCOUNTER — Encounter: Payer: Self-pay | Admitting: Licensed Clinical Social Worker

## 2022-11-17 NOTE — Patient Outreach (Signed)
  Care Coordination   Multidisciplinary Case Review Note    11/17/2022 Name: Alicia Vasquez [facility] MRN: [id number] DOB: 10/20/1940  Alicia Vasquez is a 82 y.o. year old female who sees Jimmey Ralph, Katina Degree, MD for primary care.  The  multidisciplinary care team met today to review patient care needs and barriers.    Care Coordination Interventions: Multidisciplinary case discussion to review patient ongoing care coordination needs  Patient to be engaged ongoing by RN Care Manager and LCSW to address disease management and social support needs  SDOH assessments and interventions completed:  No     Care Coordination Interventions Activated:  Yes   Care Coordination Interventions:  Yes, provided   Follow up plan: Follow up call scheduled for 1-2 weeks    Multidisciplinary Team Attendees:   Antionette Fairy, RNCM Dionne [place], RNCM [place], [place] [name], [place]  Scribe for Multidisciplinary Case Review:   [name], MSW, LCSW [facility] Care Management [facility]  [facility] [email or web address] Phone [phone] 4:39 PM

## 2022-11-18 ENCOUNTER — Inpatient Hospital Stay: Payer: Medicare Other

## 2022-11-19 ENCOUNTER — Inpatient Hospital Stay: Payer: Medicare Other

## 2022-11-21 ENCOUNTER — Other Ambulatory Visit: Payer: Self-pay

## 2022-11-21 ENCOUNTER — Telehealth: Payer: Self-pay | Admitting: Adult Health

## 2022-11-21 ENCOUNTER — Inpatient Hospital Stay: Payer: Medicare Other

## 2022-11-21 DIAGNOSIS — G893 Neoplasm related pain (acute) (chronic): Secondary | ICD-10-CM

## 2022-11-21 DIAGNOSIS — Z515 Encounter for palliative care: Secondary | ICD-10-CM

## 2022-11-21 DIAGNOSIS — M84459A Pathological fracture, hip, unspecified, initial encounter for fracture: Secondary | ICD-10-CM

## 2022-11-21 DIAGNOSIS — C7951 Secondary malignant neoplasm of bone: Secondary | ICD-10-CM

## 2022-11-21 MED ORDER — OXYCODONE HCL 5 MG PO TABS
5.0000 mg | ORAL_TABLET | Freq: Four times a day (QID) | ORAL | 0 refills | Status: DC | PRN
Start: 2022-11-21 — End: 2022-12-06

## 2022-11-21 NOTE — Telephone Encounter (Signed)
Pt called for refill of medication, see refill order.

## 2022-11-22 ENCOUNTER — Other Ambulatory Visit: Payer: Self-pay

## 2022-11-22 ENCOUNTER — Telehealth: Payer: Self-pay | Admitting: Family Medicine

## 2022-11-22 ENCOUNTER — Inpatient Hospital Stay: Payer: Medicare Other

## 2022-11-22 ENCOUNTER — Telehealth: Payer: Self-pay

## 2022-11-22 VITALS — BP 160/94 | HR 84 | Temp 98.1°F | Resp 18

## 2022-11-22 DIAGNOSIS — Z170 Estrogen receptor positive status [ER+]: Secondary | ICD-10-CM | POA: Diagnosis not present

## 2022-11-22 DIAGNOSIS — Z5111 Encounter for antineoplastic chemotherapy: Secondary | ICD-10-CM | POA: Diagnosis not present

## 2022-11-22 DIAGNOSIS — C50411 Malignant neoplasm of upper-outer quadrant of right female breast: Secondary | ICD-10-CM

## 2022-11-22 DIAGNOSIS — M899 Disorder of bone, unspecified: Secondary | ICD-10-CM

## 2022-11-22 DIAGNOSIS — M84659P Pathological fracture in other disease, hip, unspecified, subsequent encounter for fracture with malunion: Secondary | ICD-10-CM

## 2022-11-22 MED ORDER — FULVESTRANT 250 MG/5ML IM SOSY
500.0000 mg | PREFILLED_SYRINGE | Freq: Once | INTRAMUSCULAR | Status: AC
  Administered 2022-11-22: 500 mg via INTRAMUSCULAR
  Filled 2022-11-22: qty 10

## 2022-11-22 NOTE — Telephone Encounter (Signed)
Transition Care Management Follow-up Telephone Call Date of discharge and from where: 11/15/2022 [facility] How have you been since you were released from the hospital? Patient is not feeling any better. Any questions or concerns? No  Items Reviewed: Did the pt receive and understand the discharge instructions provided? Yes  Medications obtained and verified? Yes  Other? No  Any new allergies since your discharge? No  Dietary orders reviewed? Yes Do you have support at home? Yes   Follow up appointments reviewed:  PCP Hospital f/u appt confirmed? Yes  Scheduled to see Jacquiline Doe, MD on 11/24/2022 @ [place] [facility]. Specialist Hospital f/u appt confirmed? No  Scheduled to see  on  @ . Are transportation arrangements needed? No  If their condition worsens, is the pt aware to call PCP or go to the Emergency Dept.? Yes Was the patient provided with contact information for the PCP's office or ED? Yes Was to pt encouraged to call back with questions or concerns? Yes  Milan Perkins [facility]  [facility] Population Health Community Resource Care Guide   [email or web address]  ?? [phone]   Website: [email or web address]  [email or web address]

## 2022-11-22 NOTE — Telephone Encounter (Signed)
Ok to fill out FL2 form if she is agreeable.  Alicia Vasquez. Jimmey Ralph, MD 11/22/2022 2:44 PM

## 2022-11-22 NOTE — Telephone Encounter (Signed)
Caller is Trey Paula from [facility]. He called for an update on FL2 form needed for patient to move in. States he faxed it on 6/21 and is hoping it could be completed by 6/26. He did inform me he would be re-faxing paperwork today. Caller can be reached at [phone] for more information.

## 2022-11-22 NOTE — Telephone Encounter (Signed)
Spoke with patient, patient agreed with FMLA 2 form to be completed

## 2022-11-23 NOTE — Telephone Encounter (Signed)
Patient dropped off document FL2, to be filled out by provider. Patient requested to send it back via Fax within ASAP. Document is located in providers tray at front office.Please advise  

## 2022-11-23 NOTE — Telephone Encounter (Signed)
Trey Paula called again and would like to know when the [facility] form will be faxed. Pt should be moving soon. Please advise.

## 2022-11-23 NOTE — Telephone Encounter (Signed)
Standley Dakins with [employer] and asked him to fax [facility] form again due to I do not have it. Trey Paula said he faxed it yesterday and Friday, but will fax again.  Told him sorry and thank you.

## 2022-11-24 ENCOUNTER — Ambulatory Visit (INDEPENDENT_AMBULATORY_CARE_PROVIDER_SITE_OTHER): Payer: Medicare Other | Admitting: Family Medicine

## 2022-11-24 ENCOUNTER — Encounter: Payer: Self-pay | Admitting: Family Medicine

## 2022-11-24 VITALS — BP 130/76 | HR 76 | Temp 97.8°F | Ht 64.0 in

## 2022-11-24 DIAGNOSIS — I8290 Acute embolism and thrombosis of unspecified vein: Secondary | ICD-10-CM | POA: Diagnosis not present

## 2022-11-24 DIAGNOSIS — Z609 Problem related to social environment, unspecified: Secondary | ICD-10-CM

## 2022-11-24 DIAGNOSIS — I10 Essential (primary) hypertension: Secondary | ICD-10-CM

## 2022-11-24 DIAGNOSIS — Z0279 Encounter for issue of other medical certificate: Secondary | ICD-10-CM

## 2022-11-24 DIAGNOSIS — R5381 Other malaise: Secondary | ICD-10-CM

## 2022-11-24 DIAGNOSIS — Z111 Encounter for screening for respiratory tuberculosis: Secondary | ICD-10-CM

## 2022-11-24 MED ORDER — KETOCONAZOLE 2 % EX CREA: 1.0000 | TOPICAL_CREAM | Freq: Two times a day (BID) | CUTANEOUS | 0 refills | Status: DC

## 2022-11-24 MED ORDER — V-5 HIGH COMPRESSION HOSE MISC: 0 refills | Status: DC

## 2022-11-24 MED ORDER — FLUCONAZOLE 150 MG PO TABS: 150.0000 mg | ORAL_TABLET | ORAL | 0 refills | Status: AC

## 2022-11-24 MED ORDER — LISINOPRIL 10 MG PO TABS: 10.0000 mg | ORAL_TABLET | Freq: Every day | ORAL | 0 refills | Status: DC

## 2022-11-24 NOTE — Assessment & Plan Note (Signed)
Blood pressure at goal on lisinopril 10 mg daily.  Will refill today. 

## 2022-11-24 NOTE — Assessment & Plan Note (Signed)
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  This was discontinued about a month ago by oncology due to concerns by her social situation.  Now that she will be moving into assisted living would be reasonable for her to continues to prevent recurrences.  Advised her to reach back out to the cancer center to get this restarted.  She was previously going there monthly for INR checks.

## 2022-11-24 NOTE — Progress Notes (Signed)
Alicia Vasquez is a 82 y.o. female who presents today for an office visit.  Assessment/Plan:  New/Acute Problems: Rash Consistent with candidal intertrigo.  Given severity of rash would be reasonable to start oral antifungal at this point.  Will start Diflucan 150 mg weekly for 4 weeks.  Also start topical ketoconazole.  They will continue to try to keep the area clean and dry.  They will let us know if not improving.  Chronic Problems Addressed Today: Poor social situation I had a lengthy discussion with patient and her healthcare power of attorney today regarding her living situation and placement.  They do have a bed ready at [facility] assisted living and they need [name] to complete FL 2 paperwork for this to happen.  We will complete requested paperwork today.  They do require TB screening and we will check a QuantiFERON today as this will be more convenient than tuberculosis skin test.  She does have DSS and social work with [facility] helping facilitate as well.  Debility Patient needs assistance with transferring and performing ADLs at home.  She will be getting rehab at her assisted living though we will provideDME orders for hospital bed and wheelchair today.  Essential hypertension Blood pressure at goal on lisinopril 10 mg daily.  Will refill today.  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  This was discontinued about a month ago by oncology due to concerns by her social situation.  Now that she will be moving into assisted living would be reasonable for her to continues to prevent recurrences.  Advised her to reach back out to the cancer center to get this restarted.  She was previously going there monthly for INR checks.     Subjective:  HPI:  See A/P for status of chronic conditions.  Patient is here for follow-up.  We saw her last 2 months ago.  At her last visit we had a lengthy discussion regarding her social situation.  DSS  has been involved and at that point they had frozen her accounts and were trying to get her moved into a nursing home.  We had a lengthy discussion and she was fully against moving into a nursing facility at that point.  She is now here today with her new healthcare power of attorney.  She has been with his son for the last 2 months and has been working with getting her into an assisted living facility.  They do have a place at [facility] assisted living and they will be moving her in there soon.  They request [name] complete FL 2 paperwork today.  They also request that we get requested labs.  She still has ongoing issues with weakness and debility.  She is having difficulty with transferring at home. And the power of attorney request a DME order for wheelchair and hospital bed to help facility this.   There is also some concern for skin irritation and burning in her groin area.  This is been going on for quite a while.  They tried using barrier cream to area without much improvement.  Has a burning sensation in the area.  No itching.       Objective:  Physical Exam: BP 130/76 (BP Location: Left Arm, Patient Position: Sitting, Cuff Size: Normal)   Pulse 76   Temp 97.8 F (36.6 C) (Temporal)   Ht 5\' 4"  (1.626 m)   SpO2 96%   BMI 35.70 kg/m   Gen: No acute  distress, resting comfortably CV: Regular rate and rhythm with no murmurs appreciated Pulm: Normal work of breathing, clear to auscultation bilaterally with no crackles, wheezes, or rhonchi Skin: Chaperone present for exam.  Intertriginous areas with weeping and erythema consistent with candidiasis. Neuro: Grossly normal, moves all extremities Psych: Normal affect and thought content  Time Spent: 50 minutes of total time was spent on the date of the encounter performing the following actions: chart review prior to seeing the patient, obtaining history, performing a medically necessary exam, completing requested paperwork, counseling on the  treatment plan, placing orders, and documenting in our EHR.        Katina Degree. Jimmey Ralph, MD 11/24/2022 2:43 PM

## 2022-11-24 NOTE — Assessment & Plan Note (Signed)
Patient needs assistance with transferring and performing ADLs at home.  She will be getting rehab at her assisted living though we will provideDME orders for hospital bed and wheelchair today.

## 2022-11-24 NOTE — Assessment & Plan Note (Signed)
I had a lengthy discussion with patient and her healthcare power of attorney today regarding her living situation and placement.  They do have a bed ready at [facility] assisted living and they need [name] to complete FL 2 paperwork for this to happen.  We will complete requested paperwork today.  They do require TB screening and we will check a QuantiFERON today as this will be more convenient than tuberculosis skin test.  She does have DSS and social work with [facility] helping facilitate as well.

## 2022-11-24 NOTE — Patient Instructions (Addendum)
It was very nice to see you today!  You have a yeast infection.  Please start the cream and take the Diflucan once weekly for the next 4 weeks.  Let us know if not improving.  After the rash clears up please try to keep that area as dry as possible.  We will check requested blood work today and we will complete your paperwork for the assisted living facility.  We will give you orders for the hospital bed and wheelchair as well.  Return if symptoms worsen or fail to improve.   Take care, Dr Jimmey Ralph  PLEASE NOTE:  If you had any lab tests, please let us know if you have not heard back within a few days. You may see your results on mychart before we have a chance to review them but we will give you a call once they are reviewed by [name].   If we ordered any referrals today, please let us know if you have not heard from their office within the next week.   If you had any urgent prescriptions sent in today, please check with the pharmacy within an hour of our visit to make sure the prescription was transmitted appropriately.   Please try these tips to maintain a healthy lifestyle:  Eat at least 3 REAL meals and 1-2 snacks per day.  Aim for no more than 5 hours between eating.  If you eat breakfast, please do so within one hour of getting up.   Each meal should contain half fruits/vegetables, one quarter protein, and one quarter carbs (no bigger than a computer mouse)  Cut down on sweet beverages. This includes juice, soda, and sweet tea.   Drink at least 1 glass of water with each meal and aim for at least 8 glasses per day  Exercise at least 150 minutes every week.

## 2022-11-25 ENCOUNTER — Encounter: Payer: Self-pay | Admitting: Licensed Clinical Social Worker

## 2022-11-25 ENCOUNTER — Telehealth: Payer: Self-pay | Admitting: Family Medicine

## 2022-11-25 NOTE — Telephone Encounter (Signed)
Caller is Elease Hashimoto from [employer]. Caller states pt brought in paper rx order for hospital bed. States all the need is a narrative explaining why this is needed and how it would help patient included in order. They are able to pull it via EPIC.

## 2022-11-25 NOTE — Telephone Encounter (Signed)
Please send lab results that were taken on June 27th to [facility] - Fax [phone]

## 2022-11-26 LAB — QUANTIFERON-TB GOLD PLUS
Mitogen-NIL: 5.57 IU/mL
NIL: 0.01 IU/mL
QuantiFERON-TB Gold Plus: NEGATIVE
TB1-NIL: 0.02 IU/mL
TB2-NIL: 0.01 IU/mL

## 2022-11-27 ENCOUNTER — Other Ambulatory Visit: Payer: Self-pay | Admitting: Nurse Practitioner

## 2022-11-28 ENCOUNTER — Telehealth: Payer: Self-pay | Admitting: Family Medicine

## 2022-11-28 NOTE — Telephone Encounter (Signed)
Left detailed message on Jeff's voicemail at [facility] that I just faxed over pt's completed FL2 forms and TB test results. Any questions please call me at [phone].

## 2022-11-28 NOTE — Telephone Encounter (Signed)
Pt is in need of the FL2 or she will be homeless. It needs to be filled and faxed ASAP. Please advise.

## 2022-11-28 NOTE — Telephone Encounter (Signed)
FL2 forms completed and faxed to [facility] [place], Attn: Theotis Burrow at [phone].

## 2022-11-28 NOTE — Patient Outreach (Signed)
  Care Coordination   Follow Up Visit Note   11/25/2022 Name: Ameri Garciaramirez [facility] MRN: [id number] DOB: 1940-06-17  Jerome Huskins Tyer is a 82 y.o. year old female who sees Jimmey Ralph, Katina Degree, MD for primary care. I  engaged with DSS SW  What matters to the patients health and wellness today?  Care Coordination   SDOH assessments and interventions completed:  No     Care Coordination Interventions:  Yes, provided  Interventions Today    Flowsheet Row Most Recent Value  Chronic Disease   Chronic disease during today's visit Hypertension (HTN), Other  [Breast Cancer]  General Interventions   General Interventions Discussed/Reviewed Communication with  Communication with --  [LCSW collaborated with DSS regarding patient care needs and/or barriers]       Follow up plan: Follow up call scheduled for 1-2 weeks    Encounter Outcome:  Pt. Visit Completed   [name], MSW, LCSW [facility] Care Management [facility]  [facility] [email or web address] Phone [phone] 9:07 AM

## 2022-11-28 NOTE — Telephone Encounter (Signed)
Caller was [employer] from [facility] on [address] in [place]. Caller was requesting clarification on patient's warfarin dosage and when last INR was. Caller can be reached with information @ [phone].

## 2022-11-28 NOTE — Progress Notes (Signed)
Her TB test is negative. We will complete her FL2.  Alicia Vasquez. Jimmey Ralph, MD 11/28/2022 7:28 AM

## 2022-11-28 NOTE — Patient Instructions (Signed)
Visit Information  Thank you for taking time to visit with me today. Please don't hesitate to contact me if I can be of assistance to you.    Please call the care guide team at [phone] if you need to cancel or reschedule your appointment.   If you are experiencing a Mental Health or Behavioral Health Crisis or need someone to talk to, please call the Suicide and Crisis Lifeline: 988 call 911    [name], MSW, LCSW [facility] Care Management [facility]  [facility] [email or web address] Phone [phone] 9:08 AM

## 2022-11-29 ENCOUNTER — Telehealth: Payer: Self-pay | Admitting: *Deleted

## 2022-11-29 NOTE — Telephone Encounter (Signed)
FL2 form faxed to [phone] on 11/28/2022 Copy placed to be scan in patient chart

## 2022-11-29 NOTE — Telephone Encounter (Signed)
Please call pt back with details on the hospital bed.

## 2022-11-30 ENCOUNTER — Other Ambulatory Visit: Payer: Self-pay | Admitting: *Deleted

## 2022-11-30 DIAGNOSIS — M25561 Pain in right knee: Secondary | ICD-10-CM

## 2022-11-30 NOTE — Telephone Encounter (Signed)
Please advise. No Warfarin in patient current medication list

## 2022-11-30 NOTE — Telephone Encounter (Signed)
Called [facility] at  [phone] Notified new DME was send with PCP comments on why patient needed hospital bed

## 2022-11-30 NOTE — Telephone Encounter (Signed)
See previews note 

## 2022-11-30 NOTE — Telephone Encounter (Signed)
Patient suffers from debility and generalized weakness and needs support for ADLs. A Hospital bed would help with her need for frequent repositioning to prevent pressure ulcers and help facilitate transferring in and out of bed.  Alicia Vasquez. Jimmey Ralph, MD 11/30/2022 9:05 AM

## 2022-11-30 NOTE — Telephone Encounter (Signed)
Spoke with Alicia Vasquez [phone] Notified Rx Warfarin managed by cancer center

## 2022-11-30 NOTE — Telephone Encounter (Signed)
Caller is patient's friend, [name]. Caller states she was returning call in regards to narrative for patient's hospital bed. Caller would like this done soon since patient needs this to move in.

## 2022-11-30 NOTE — Telephone Encounter (Signed)
This is managed by the cancer center.  Katina Degree. Jimmey Ralph, MD 11/30/2022 10:58 AM

## 2022-12-01 ENCOUNTER — Telehealth: Payer: Self-pay | Admitting: Licensed Clinical Social Worker

## 2022-12-01 NOTE — Patient Instructions (Signed)
Visit Information  Thank you for taking time to visit with me today. Please don't hesitate to contact me if I can be of assistance to you.   Following are the goals we discussed today:   Goals Addressed             This Visit's Progress    Obtain Stable and Safe Housing   On track    Activities and task to complete in order to accomplish goals.   Schedule transportation through Cancer center for their upcoming appts. Utilize [facility] [place] for additional transportation needs Patient will comply with meds prescribed by doctors Continue working with [facility] to reach identified goals F/up with [employer] to inform them of change of address               Our next appointment is by telephone on 07/15 at 11:30AM  Please call the care guide team at [phone] if you need to cancel or reschedule your appointment.   If you are experiencing a Mental Health or Behavioral Health Crisis or need someone to talk to, please call the Suicide and Crisis Lifeline: 988 call 911   The patient verbalized understanding of instructions, educational materials, and care plan provided today and DECLINED offer to receive copy of patient instructions, educational materials, and care plan.   [name], MSW, LCSW [facility] Care Management [place]  [facility] [email or web address] Phone [phone] 12:49 AM

## 2022-12-01 NOTE — Patient Outreach (Signed)
  Care Coordination   Follow Up Visit Note   11/30/2022 Name: Alicia Vasquez MRN: [id number] DOB: December 15, 1940  Alicia Vasquez is a 82 y.o. year old female who sees Jimmey Ralph, Katina Degree, MD for primary care. I spoke with  Alicia Vasquez by phone today.  What matters to the patients health and wellness today?  Housing, Field seismologist, Food Insecurity    Goals Addressed             This Visit's Progress    Obtain Stable and Safe Housing   On track    Activities and task to complete in order to accomplish goals.   Schedule transportation through Cancer center for their upcoming appts. Utilize [facility] [place] for additional transportation needs Patient will comply with meds prescribed by doctors Continue working with [facility] to reach identified goals F/up with [employer] to inform them of change of address               SDOH assessments and interventions completed:  No     Care Coordination Interventions:  Yes, provided  Interventions Today    Flowsheet Row Most Recent Value  Chronic Disease   Chronic disease during today's visit Hypertension (HTN), Other  [Depression]  General Interventions   General Interventions Discussed/Reviewed General Interventions Reviewed, Community Resources  Doctor Visits Discussed/Reviewed Doctor Visits Reviewed  Mental Health Interventions   Mental Health Discussed/Reviewed Mental Health Reviewed, Coping Strategies, Anxiety, Depression, Grief and Loss  Nutrition Interventions   Nutrition Discussed/Reviewed Nutrition Reviewed  Safety Interventions   Safety Discussed/Reviewed Safety Reviewed       Follow up plan: Follow up call scheduled for 2 weeks    Encounter Outcome:  Pt. Visit Completed   Alicia Vasquez, Alicia Vasquez, Alicia Vasquez [facility] Care Management [facility]  [facility] [email or web address] Phone [phone] 12:48 AM

## 2022-12-02 NOTE — Telephone Encounter (Signed)
Alicia Vasquez requests to be called re: Narrative that was sent to [facility] for Patient's Hospital Bed to be moved to [name] [name]

## 2022-12-05 ENCOUNTER — Telehealth: Payer: Self-pay | Admitting: *Deleted

## 2022-12-05 NOTE — Telephone Encounter (Signed)
Contacted by Emmit Alexanders, RN, Clinical Director at [facility] [employer]. Patient has been admitted to this facility. They need to confirm whether or not patient is on Coumadin as medication is on patient's FL2.  Per Mardella Layman, please notify Ms. York @ [facility] that Coumadin was discontinued following patient's appt here on 10/03/22 and also please contact patient to schedule for lab and f/u with Dr. Al Pimple in August when she comes for August Faslodex injection.   Notified Ms. York by phone that Coumadin was discontinued. Faxed signed verbal order from Ms. Causey to discontinue Coumadin at Ms. York's request. Fax confirmation received.  Patient lives in facility and they will provide transportation. Schedule message sent with patient and [facility] contact information

## 2022-12-05 NOTE — Telephone Encounter (Signed)
Alicia Vasquez from [facility] on [address] in [place] called in regards to a separate issue. During conversation she confirmed that order for hospital bed was received and bed would be there today @ 6pm.

## 2022-12-05 NOTE — Telephone Encounter (Signed)
I've confirmed that bed has arrived. I have spoken with patient in regard.  Patient states she needed a larger wheelchair.  I have spoke with [place].  Alicia Vasquez states [facility] should be able to exchange without another order from our office.  I have advised for her to reach out to our office for any additional assistance.

## 2022-12-05 NOTE — Telephone Encounter (Signed)
Patient called in for an update on hospital bed. States facility still hadn't gotten the order for the hospital bed. Requests to speak with office manager.

## 2022-12-05 NOTE — Telephone Encounter (Signed)
[name] called back stating that [facility] needs a new wheelchair order for the bigger wheelchair. This order can be faxed to [phone].

## 2022-12-06 ENCOUNTER — Telehealth: Payer: Self-pay | Admitting: Hematology and Oncology

## 2022-12-06 ENCOUNTER — Other Ambulatory Visit: Payer: Self-pay | Admitting: Nurse Practitioner

## 2022-12-06 DIAGNOSIS — Z9181 History of falling: Secondary | ICD-10-CM | POA: Diagnosis not present

## 2022-12-06 DIAGNOSIS — I10 Essential (primary) hypertension: Secondary | ICD-10-CM | POA: Diagnosis not present

## 2022-12-06 DIAGNOSIS — R112 Nausea with vomiting, unspecified: Secondary | ICD-10-CM | POA: Diagnosis not present

## 2022-12-06 DIAGNOSIS — Z853 Personal history of malignant neoplasm of breast: Secondary | ICD-10-CM | POA: Diagnosis not present

## 2022-12-06 DIAGNOSIS — Z515 Encounter for palliative care: Secondary | ICD-10-CM

## 2022-12-06 DIAGNOSIS — G893 Neoplasm related pain (acute) (chronic): Secondary | ICD-10-CM

## 2022-12-06 DIAGNOSIS — C7951 Secondary malignant neoplasm of bone: Secondary | ICD-10-CM

## 2022-12-06 DIAGNOSIS — Z86718 Personal history of other venous thrombosis and embolism: Secondary | ICD-10-CM | POA: Diagnosis not present

## 2022-12-06 DIAGNOSIS — M84459A Pathological fracture, hip, unspecified, initial encounter for fracture: Secondary | ICD-10-CM

## 2022-12-06 DIAGNOSIS — R110 Nausea: Secondary | ICD-10-CM

## 2022-12-06 DIAGNOSIS — K5900 Constipation, unspecified: Secondary | ICD-10-CM | POA: Diagnosis not present

## 2022-12-06 DIAGNOSIS — N390 Urinary tract infection, site not specified: Secondary | ICD-10-CM | POA: Diagnosis not present

## 2022-12-06 DIAGNOSIS — E559 Vitamin D deficiency, unspecified: Secondary | ICD-10-CM | POA: Diagnosis not present

## 2022-12-06 DIAGNOSIS — E039 Hypothyroidism, unspecified: Secondary | ICD-10-CM | POA: Diagnosis not present

## 2022-12-06 NOTE — Telephone Encounter (Signed)
Spoke with Dorann Lodge at [facility] facility to confirm upcoming appointment.to set up transportation.

## 2022-12-07 ENCOUNTER — Ambulatory Visit: Payer: Self-pay

## 2022-12-07 DIAGNOSIS — F4321 Adjustment disorder with depressed mood: Secondary | ICD-10-CM | POA: Diagnosis not present

## 2022-12-07 NOTE — Patient Instructions (Signed)
Visit Information  Thank you for taking time to visit with me today. Please don't hesitate to contact me if I can be of assistance to you.   Following are the goals we discussed today:   Goals Addressed             This Visit's Progress    Pain control/ getting into possible assisted living       Patient Goals/Self Care Activities: -Patient/Caregiver will self-administer medications as prescribed as evidenced by self-report/primary caregiver report  -Patient/Caregiver will attend all scheduled provider appointments as evidenced by clinician review of documented attendance to scheduled appointments and patient/caregiver report -Patient/Caregiver will call provider office for new concerns or questions as evidenced by review of documented incoming telephone call notes and patient report   Patient now residing at [facility]. Patient states that they are good to her. Patient voices no concerns.          If you are experiencing a Mental Health or Behavioral Health Crisis or need someone to talk to, please call the Suicide and Crisis Lifeline: 988   The patient verbalized understanding of instructions, educational materials, and care plan provided today and DECLINED offer to receive copy of patient instructions, educational materials, and care plan.   The patient has been provided with contact information for the care management team and has been advised to call with any health related questions or concerns.   Bary Leriche, RN, MSN [facility] Care Management Care Management Coordinator Direct Line [phone]

## 2022-12-07 NOTE — Patient Outreach (Signed)
  Care Coordination   Follow Up Visit Note   12/07/2022 Name: Alicia Vasquez, Alicia Vasquez MRN: [id number] DOB: [birth year]  Alicia Vasquez is a 82 y.o. year old female who sees Jimmey Ralph, Katina Degree, MD for primary care. I spoke with  Ardith Dark Aliano by phone today.  What matters to the patients health and wellness today?  In assisted living    Goals Addressed             This Visit's Progress    Pain control/ getting into possible assisted living       Patient Goals/Self Care Activities: -Patient/Caregiver will self-administer medications as prescribed as evidenced by self-report/primary caregiver report  -Patient/Caregiver will attend all scheduled provider appointments as evidenced by clinician review of documented attendance to scheduled appointments and patient/caregiver report -Patient/Caregiver will call provider office for new concerns or questions as evidenced by review of documented incoming telephone call notes and patient report   Patient now residing at [facility]. Patient states that they are good to her. Patient voices no concerns.         SDOH assessments and interventions completed:  Yes     Care Coordination Interventions:  Yes, provided   Follow up plan: No further intervention required.   Encounter Outcome:  Pt. Visit Completed   Bary Leriche, RN, MSN [place] [facility] Care Management Care Management Coordinator Direct Line [phone]

## 2022-12-08 DIAGNOSIS — Z79899 Other long term (current) drug therapy: Secondary | ICD-10-CM | POA: Diagnosis not present

## 2022-12-08 DIAGNOSIS — E039 Hypothyroidism, unspecified: Secondary | ICD-10-CM | POA: Diagnosis not present

## 2022-12-08 DIAGNOSIS — Z7901 Long term (current) use of anticoagulants: Secondary | ICD-10-CM | POA: Diagnosis not present

## 2022-12-08 DIAGNOSIS — E559 Vitamin D deficiency, unspecified: Secondary | ICD-10-CM | POA: Diagnosis not present

## 2022-12-09 ENCOUNTER — Other Ambulatory Visit: Payer: Self-pay | Admitting: *Deleted

## 2022-12-09 DIAGNOSIS — F419 Anxiety disorder, unspecified: Secondary | ICD-10-CM | POA: Diagnosis not present

## 2022-12-09 DIAGNOSIS — E559 Vitamin D deficiency, unspecified: Secondary | ICD-10-CM | POA: Diagnosis not present

## 2022-12-09 DIAGNOSIS — N390 Urinary tract infection, site not specified: Secondary | ICD-10-CM | POA: Diagnosis not present

## 2022-12-09 DIAGNOSIS — Z86718 Personal history of other venous thrombosis and embolism: Secondary | ICD-10-CM | POA: Diagnosis not present

## 2022-12-09 DIAGNOSIS — F32A Depression, unspecified: Secondary | ICD-10-CM | POA: Diagnosis not present

## 2022-12-09 DIAGNOSIS — M150 Primary generalized (osteo)arthritis: Secondary | ICD-10-CM | POA: Diagnosis not present

## 2022-12-09 DIAGNOSIS — I700 Atherosclerosis of aorta: Secondary | ICD-10-CM | POA: Diagnosis not present

## 2022-12-09 DIAGNOSIS — R112 Nausea with vomiting, unspecified: Secondary | ICD-10-CM | POA: Diagnosis not present

## 2022-12-09 DIAGNOSIS — Z853 Personal history of malignant neoplasm of breast: Secondary | ICD-10-CM | POA: Diagnosis not present

## 2022-12-09 DIAGNOSIS — I890 Lymphedema, not elsewhere classified: Secondary | ICD-10-CM | POA: Diagnosis not present

## 2022-12-09 DIAGNOSIS — K5900 Constipation, unspecified: Secondary | ICD-10-CM | POA: Diagnosis not present

## 2022-12-09 DIAGNOSIS — I10 Essential (primary) hypertension: Secondary | ICD-10-CM | POA: Diagnosis not present

## 2022-12-09 DIAGNOSIS — Z6835 Body mass index (BMI) 35.0-35.9, adult: Secondary | ICD-10-CM | POA: Diagnosis not present

## 2022-12-09 DIAGNOSIS — E039 Hypothyroidism, unspecified: Secondary | ICD-10-CM | POA: Diagnosis not present

## 2022-12-12 ENCOUNTER — Ambulatory Visit: Payer: Self-pay | Admitting: Licensed Clinical Social Worker

## 2022-12-12 NOTE — Telephone Encounter (Signed)
New Rx was faxed to [phone] [facility]

## 2022-12-13 DIAGNOSIS — N390 Urinary tract infection, site not specified: Secondary | ICD-10-CM | POA: Diagnosis not present

## 2022-12-13 DIAGNOSIS — I10 Essential (primary) hypertension: Secondary | ICD-10-CM | POA: Diagnosis not present

## 2022-12-13 DIAGNOSIS — I700 Atherosclerosis of aorta: Secondary | ICD-10-CM | POA: Diagnosis not present

## 2022-12-13 DIAGNOSIS — F32A Depression, unspecified: Secondary | ICD-10-CM | POA: Diagnosis not present

## 2022-12-13 DIAGNOSIS — I890 Lymphedema, not elsewhere classified: Secondary | ICD-10-CM | POA: Diagnosis not present

## 2022-12-13 DIAGNOSIS — M150 Primary generalized (osteo)arthritis: Secondary | ICD-10-CM | POA: Diagnosis not present

## 2022-12-13 NOTE — Progress Notes (Deleted)
Palliative Medicine [facility]  [phone] [phone]   Name: Alicia Vasquez Date: 12/13/2022 MRN: [id number]  DOB: March 02, 1941  Patient Care Team: Ardith Dark, MD as PCP - General (Family Medicine) Carlus Pavlov, MD as Consulting Physician (Internal Medicine) Charlynne Pander, MD as Referring Physician (Internal Medicine) Lenn Sink, DPM as Consulting Physician (Podiatry) Alfredo Martinez, MD as Consulting Physician (Urology) Rachel Moulds, MD as Consulting Physician (Hematology and Oncology) Durene Romans, MD as Consulting Physician (Orthopedic Surgery)   I connected with Ardith Dark Seier on 12/13/22 at  3:00 PM EDT by phone and verified that I am speaking with the correct person using two identifiers.   I discussed the limitations, risks, security and privacy concerns of performing an evaluation and management service by telemedicine and the availability of in-person appointments. I also discussed with the patient that there may be a patient responsible charge related to this service. The patient expressed understanding and agreed to proceed.   Other persons participating in the visit and their role in the encounter: n/a   Patient's location: home  Provider's location: [facility]   Chief Complaint: f/u of symptom management   INTERVAL HISTORY: Alicia Vasquez is a 82 y.o. female with oncology medical history including right breast cancer ER positive s/p adjuvant radiation (2015), found to have metastatic disease August 2018 s/p left total hip replacement, multiple compression fractures, currently on fulvestrant therapy.  Palliative ask to see for symptom management and goals of care.   SOCIAL HISTORY:     reports that she has never smoked. She has never used smokeless tobacco. She reports that she does not drink alcohol and does not use drugs.  ADVANCE DIRECTIVES:    CODE STATUS:   PAST MEDICAL HISTORY: Past Medical  History:  Diagnosis Date   Allergy    Anxiety    Arthritis    Blood transfusion without reported diagnosis    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   DVT (deep vein thrombosis) in pregnancy    Hypertension    Hypothyroid    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   Personal history of radiation therapy    Pneumonia    PONV (postoperative nausea and vomiting)    Radiation 11/21/13-01/07/14   Right Breast/Supraclavicular    ALLERGIES:  is allergic to anesthetics, amide; benadryl [diphenhydramine hcl]; carbocaine [mepivacaine hcl]; codeine; epinephrine; sulfa antibiotics; diphenhydramine; latex; penicillins; and tramadol.  MEDICATIONS:  Current Outpatient Medications  Medication Sig Dispense Refill   cholecalciferol (VITAMIN D3) 25 MCG (1000 UNIT) tablet Take 1,000 Units by mouth daily. Current dose 5000 units     Elastic Bandages & Supports (V-5 HIGH COMPRESSION HOSE) MISC Use daily as needed for leg edema 2 each 0   fluconazole (DIFLUCAN) 150 MG tablet Take 1 tablet (150 mg total) by mouth once a week for 4 doses. 4 tablet 0   ketoconazole (NIZORAL) 2 % cream Apply 1 Application topically 2 (two) times daily. 60 g 0   levothyroxine (SYNTHROID) 112 MCG tablet Take 1 tablet (112 mcg total) by mouth daily before breakfast. 45 tablet 5   lisinopril (PRINIVIL) 10 MG tablet Take 1 tablet (10 mg total) by mouth daily. 30 tablet 0   naloxone (NARCAN) 0.4 MG/ML injection Inject 1 mL (0.4 mg total) into the muscle as needed. 1 mL 0   ondansetron (ZOFRAN) 8 MG tablet TAKE ONE TABLET BY MOUTH every EIGHT hours AS NEEDED  FOR NAUSEA AND VOMITING 90 tablet 0   oxyCODONE (OXY IR/ROXICODONE) 5 MG immediate release tablet TAKE ONE TABLET BY MOUTH EVERY 6 HOURS AS NEEDED FOR SEVERE pain 60 tablet 0   pantoprazole (PROTONIX) 40 MG tablet Take 1 tablet (40 mg total) by mouth daily before breakfast. 30 tablet 2   No current facility-administered medications for this visit.     VITAL SIGNS: There were no vitals taken for this visit. There were no vitals filed for this visit.  Estimated body mass index is 35.7 kg/m as calculated from the following:   Height as of 11/24/22: 5\' 4"  (1.626 m).   Weight as of 10/10/22: 208 lb (94.3 kg).    PERFORMANCE STATUS (ECOG) : 2 - Symptomatic, <50% confined to bed  Impression    Neoplasm related pain Ms. Bells states her pain is mostly controlled on current regimen despite recent strain. We reviewed recent x-ray which did not show any acute abnormalities.   We reviewed her medications. She is taking Tylenol 325mg  twice daily. Advised she make take 2 tablets three times daily for mild aches and pains. Oxycodone 5mg  every 6 hours as needed.   We will continue to support and follow.   Constipation Controlled with stool softeners daily.   I discussed the importance of continued conversation with family and their medical providers regarding overall plan of care and treatment options, ensuring decisions are within the context of the patients values and GOCs.  PLAN:  Oxycodone 5 mg every 6 hours as needed.  Zofran 8 mg every 8 hours as needed for nausea.  Patient states she generally has nausea at least 2-3 days post Faslodex injection Colace twice daily Tylenol 650mg  three times daily Protonix 40 mg daily  Ongoing symptom management and support as needed I will plan to see patient back in 3-4 weeks in collaboration with other oncology appointments.    Patient expressed understanding and was in agreement with this plan. She also understands that She can call the clinic at any time with any questions, concerns, or complaints.    Any controlled substances utilized were prescribed in the context of palliative care. PDMP has been reviewed.    Visit consisted of counseling and education dealing with the complex and emotionally intense issues of symptom management and palliative care in the setting of serious and  potentially life-threatening illness.Greater than 50%  of this time was spent counseling and coordinating care related to the above assessment and plan.  [name], AGPCNP-BC  Palliative Medicine Team/[facility]  *Please note that this is a verbal dictation therefore any spelling or grammatical errors are due to the "Dragon Medical One" system interpretation.

## 2022-12-13 NOTE — Patient Instructions (Signed)
Visit Information  Thank you for taking time to visit with me today. Please don't hesitate to contact me if I can be of assistance to you.   Following are the goals we discussed today:   Goals Addressed             This Visit's Progress    COMPLETED: Obtain Stable and Safe Housing   On track    Activities and task to complete in order to accomplish goals.   Schedule transportation through Cancer center for their upcoming appts. Utilize [employer] [place] for additional transportation needs Patient will comply with meds prescribed by doctors Continue working with [facility] to reach identified goals F/up with [employer] to inform them of change of address            Obtain Supportive Resources-Managing MH Symptoms   On track    Activities and task to complete in order to accomplish goals.   Keep all upcoming appointments discussed today Continue with compliance of taking medication prescribed by Doctor Implement healthy coping skills discussed to assist with management of symptoms         Our next appointment is by telephone on 07/23 at 11 AM  Please call the care guide team at [phone] if you need to cancel or reschedule your appointment.   If you are experiencing a Mental Health or Behavioral Health Crisis or need someone to talk to, please call the Suicide and Crisis Lifeline: 988 call 911   The patient verbalized understanding of instructions, educational materials, and care plan provided today and DECLINED offer to receive copy of patient instructions, educational materials, and care plan.   [name], MSW, LCSW [facility] Care Management [place]  [facility] [email or web address] Phone [phone] 9:52 AM  ]

## 2022-12-13 NOTE — Patient Outreach (Signed)
  Care Coordination   Follow Up Visit Note   12/12/2022 Name: Alicia Vasquez [facility] MRN: [id number] DOB: 08-16-1940  Alicia Vasquez is a 82 y.o. year old female who sees Alicia Vasquez, Alicia Degree, MD for primary care. I spoke with  Alicia Vasquez by phone today.  What matters to the patients health and wellness today?  Symptom Management    Goals Addressed             This Visit's Progress    COMPLETED: Obtain Stable and Safe Housing   On track    Activities and task to complete in order to accomplish goals.   Schedule transportation through Cancer center for their upcoming appts. Utilize [employer] [place] for additional transportation needs Patient will comply with meds prescribed by doctors Continue working with [facility] to reach identified goals F/up with [employer] to inform them of change of address            Obtain Supportive Resources-Managing MH Symptoms   On track    Activities and task to complete in order to accomplish goals.   Keep all upcoming appointments discussed today Continue with compliance of taking medication prescribed by Doctor Implement healthy coping skills discussed to assist with management of symptoms         SDOH assessments and interventions completed:  Yes  SDOH Interventions Today    Flowsheet Row Most Recent Value  SDOH Interventions   Food Insecurity Interventions Intervention Not Indicated  Housing Interventions Intervention Not Indicated  Transportation Interventions Intervention Not Indicated, Patient Resources (Friends/Family), Other (Comment)  [Assisted Living Facility provides transportation]        Care Coordination Interventions:  Yes, provided  Interventions Today    Flowsheet Row Most Recent Value  Chronic Disease   Chronic disease during today's visit Hypertension (HTN), Other  [Cancer, Depression, Chronic Pain]  General Interventions   General Interventions Discussed/Reviewed General  Interventions Reviewed, Doctor Visits, Communication with  Doctor Visits Discussed/Reviewed Doctor Visits Reviewed  Communication with RN  Mental Health Interventions   Mental Health Discussed/Reviewed Mental Health Reviewed, Coping Strategies, Depression  [Validation and encouragement provided. Pt identified stressors and strategies to assist with feelings of guilt discussed]  Nutrition Interventions   Nutrition Discussed/Reviewed Nutrition Reviewed  Pharmacy Interventions   Pharmacy Dicussed/Reviewed Pharmacy Topics Reviewed, Medication Adherence  [Pt agreed to discuss concerns with PRN for pain medications at this week appts]  Safety Interventions   Safety Discussed/Reviewed Safety Reviewed       Follow up plan: Follow up call scheduled for 1-2 weeks    Encounter Outcome:  Pt. Visit Completed   [name], MSW, LCSW [facility] Care Management [facility] Health  [facility] [email or web address] Phone [phone] 9:51 AM

## 2022-12-14 ENCOUNTER — Inpatient Hospital Stay: Payer: Medicare Other | Attending: Oncology | Admitting: Nurse Practitioner

## 2022-12-14 DIAGNOSIS — I700 Atherosclerosis of aorta: Secondary | ICD-10-CM | POA: Diagnosis not present

## 2022-12-14 DIAGNOSIS — C7951 Secondary malignant neoplasm of bone: Secondary | ICD-10-CM | POA: Insufficient documentation

## 2022-12-14 DIAGNOSIS — Z170 Estrogen receptor positive status [ER+]: Secondary | ICD-10-CM | POA: Insufficient documentation

## 2022-12-14 DIAGNOSIS — N390 Urinary tract infection, site not specified: Secondary | ICD-10-CM | POA: Diagnosis not present

## 2022-12-14 DIAGNOSIS — C50411 Malignant neoplasm of upper-outer quadrant of right female breast: Secondary | ICD-10-CM | POA: Insufficient documentation

## 2022-12-14 DIAGNOSIS — F32A Depression, unspecified: Secondary | ICD-10-CM | POA: Diagnosis not present

## 2022-12-14 DIAGNOSIS — I890 Lymphedema, not elsewhere classified: Secondary | ICD-10-CM | POA: Diagnosis not present

## 2022-12-14 DIAGNOSIS — F4321 Adjustment disorder with depressed mood: Secondary | ICD-10-CM | POA: Diagnosis not present

## 2022-12-14 DIAGNOSIS — M150 Primary generalized (osteo)arthritis: Secondary | ICD-10-CM | POA: Diagnosis not present

## 2022-12-14 DIAGNOSIS — Z5111 Encounter for antineoplastic chemotherapy: Secondary | ICD-10-CM | POA: Insufficient documentation

## 2022-12-14 DIAGNOSIS — I10 Essential (primary) hypertension: Secondary | ICD-10-CM | POA: Diagnosis not present

## 2022-12-15 DIAGNOSIS — I700 Atherosclerosis of aorta: Secondary | ICD-10-CM | POA: Diagnosis not present

## 2022-12-15 DIAGNOSIS — I890 Lymphedema, not elsewhere classified: Secondary | ICD-10-CM | POA: Diagnosis not present

## 2022-12-15 DIAGNOSIS — N390 Urinary tract infection, site not specified: Secondary | ICD-10-CM | POA: Diagnosis not present

## 2022-12-15 DIAGNOSIS — F32A Depression, unspecified: Secondary | ICD-10-CM | POA: Diagnosis not present

## 2022-12-15 DIAGNOSIS — I10 Essential (primary) hypertension: Secondary | ICD-10-CM | POA: Diagnosis not present

## 2022-12-15 DIAGNOSIS — M150 Primary generalized (osteo)arthritis: Secondary | ICD-10-CM | POA: Diagnosis not present

## 2022-12-15 NOTE — Patient Outreach (Signed)
  Care Coordination   Multidisciplinary Case Review Note    12/15/2022 Name: Georgene Kopper [facility] MRN: [id number] DOB: 09-Dec-1940  Albena Comes Barg is a 82 y.o. year old female who sees Jimmey Ralph, Katina Degree, MD for primary care.  The  multidisciplinary care team met today to review patient care needs and barriers.    Patient now in assisted living.  RN CM closed case.  LCSW continues to follow for assistance with adjustment to assisted living and challenges with her son.     Goals Addressed   None     SDOH assessments and interventions completed:  Yes     Care Coordination Interventions Activated:  Yes   Care Coordination Interventions:  Yes, provided   Follow up plan:  LCSW continues to follow.    Multidisciplinary Team Attendees:   Antionette Fairy, RN [name], LCSW Fleeta Emmer, RN  Scribe for Multidisciplinary Case Review:  Fleeta Emmer, RN  Bary Leriche, RN, MSN [facility] Care Management Care Management Coordinator Direct Line [phone]

## 2022-12-16 ENCOUNTER — Telehealth: Payer: Self-pay | Admitting: *Deleted

## 2022-12-16 ENCOUNTER — Other Ambulatory Visit: Payer: Self-pay

## 2022-12-16 ENCOUNTER — Inpatient Hospital Stay: Payer: Medicare Other

## 2022-12-16 VITALS — BP 138/73 | HR 98 | Temp 98.3°F | Resp 18

## 2022-12-16 DIAGNOSIS — M899 Disorder of bone, unspecified: Secondary | ICD-10-CM

## 2022-12-16 DIAGNOSIS — I890 Lymphedema, not elsewhere classified: Secondary | ICD-10-CM | POA: Diagnosis not present

## 2022-12-16 DIAGNOSIS — R109 Unspecified abdominal pain: Secondary | ICD-10-CM | POA: Diagnosis not present

## 2022-12-16 DIAGNOSIS — M150 Primary generalized (osteo)arthritis: Secondary | ICD-10-CM | POA: Diagnosis not present

## 2022-12-16 DIAGNOSIS — C7951 Secondary malignant neoplasm of bone: Secondary | ICD-10-CM | POA: Diagnosis not present

## 2022-12-16 DIAGNOSIS — I700 Atherosclerosis of aorta: Secondary | ICD-10-CM | POA: Diagnosis not present

## 2022-12-16 DIAGNOSIS — C50411 Malignant neoplasm of upper-outer quadrant of right female breast: Secondary | ICD-10-CM

## 2022-12-16 DIAGNOSIS — Z5111 Encounter for antineoplastic chemotherapy: Secondary | ICD-10-CM | POA: Diagnosis not present

## 2022-12-16 DIAGNOSIS — F32A Depression, unspecified: Secondary | ICD-10-CM | POA: Diagnosis not present

## 2022-12-16 DIAGNOSIS — N390 Urinary tract infection, site not specified: Secondary | ICD-10-CM | POA: Diagnosis not present

## 2022-12-16 DIAGNOSIS — I10 Essential (primary) hypertension: Secondary | ICD-10-CM | POA: Diagnosis not present

## 2022-12-16 DIAGNOSIS — Z170 Estrogen receptor positive status [ER+]: Secondary | ICD-10-CM | POA: Diagnosis not present

## 2022-12-16 DIAGNOSIS — M84659P Pathological fracture in other disease, hip, unspecified, subsequent encounter for fracture with malunion: Secondary | ICD-10-CM

## 2022-12-16 MED ORDER — FULVESTRANT 250 MG/5ML IM SOSY
500.0000 mg | PREFILLED_SYRINGE | Freq: Once | INTRAMUSCULAR | Status: AC
  Administered 2022-12-16: 500 mg via INTRAMUSCULAR

## 2022-12-16 NOTE — Telephone Encounter (Signed)
This RN spoke with  pt her POA- Alicia Vasquez- her guardian ad litem-and the activity director from [place].  Alicia Vasquez is asking per just so she can understand - " I know the coumadin has been discontinued - but just so we understand- should she be on Eliquis ?"  She states also that Alicia Vasquez has significant pain that is not being well managed by her current pain medication of oxy ir 5 mg.  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  This RN informed her the above questions would be reviewed with provider and call returned on Monday.  Alicia Vasquez will bring her HCPOA papers in with next visit. Return call number for Alicia Vasquez is [phone]

## 2022-12-19 DIAGNOSIS — N390 Urinary tract infection, site not specified: Secondary | ICD-10-CM | POA: Diagnosis not present

## 2022-12-19 DIAGNOSIS — I700 Atherosclerosis of aorta: Secondary | ICD-10-CM | POA: Diagnosis not present

## 2022-12-19 DIAGNOSIS — I10 Essential (primary) hypertension: Secondary | ICD-10-CM | POA: Diagnosis not present

## 2022-12-19 DIAGNOSIS — M150 Primary generalized (osteo)arthritis: Secondary | ICD-10-CM | POA: Diagnosis not present

## 2022-12-19 DIAGNOSIS — F32A Depression, unspecified: Secondary | ICD-10-CM | POA: Diagnosis not present

## 2022-12-19 DIAGNOSIS — I890 Lymphedema, not elsewhere classified: Secondary | ICD-10-CM | POA: Diagnosis not present

## 2022-12-20 ENCOUNTER — Ambulatory Visit: Payer: Self-pay | Admitting: Licensed Clinical Social Worker

## 2022-12-20 DIAGNOSIS — N390 Urinary tract infection, site not specified: Secondary | ICD-10-CM | POA: Diagnosis not present

## 2022-12-20 DIAGNOSIS — M150 Primary generalized (osteo)arthritis: Secondary | ICD-10-CM | POA: Diagnosis not present

## 2022-12-20 DIAGNOSIS — I10 Essential (primary) hypertension: Secondary | ICD-10-CM | POA: Diagnosis not present

## 2022-12-20 DIAGNOSIS — I700 Atherosclerosis of aorta: Secondary | ICD-10-CM | POA: Diagnosis not present

## 2022-12-20 DIAGNOSIS — I890 Lymphedema, not elsewhere classified: Secondary | ICD-10-CM | POA: Diagnosis not present

## 2022-12-20 DIAGNOSIS — F32A Depression, unspecified: Secondary | ICD-10-CM | POA: Diagnosis not present

## 2022-12-21 DIAGNOSIS — I890 Lymphedema, not elsewhere classified: Secondary | ICD-10-CM | POA: Diagnosis not present

## 2022-12-21 DIAGNOSIS — N390 Urinary tract infection, site not specified: Secondary | ICD-10-CM | POA: Diagnosis not present

## 2022-12-21 DIAGNOSIS — I10 Essential (primary) hypertension: Secondary | ICD-10-CM | POA: Diagnosis not present

## 2022-12-21 DIAGNOSIS — F32A Depression, unspecified: Secondary | ICD-10-CM | POA: Diagnosis not present

## 2022-12-21 DIAGNOSIS — M150 Primary generalized (osteo)arthritis: Secondary | ICD-10-CM | POA: Diagnosis not present

## 2022-12-21 DIAGNOSIS — I700 Atherosclerosis of aorta: Secondary | ICD-10-CM | POA: Diagnosis not present

## 2022-12-22 ENCOUNTER — Encounter: Payer: Self-pay | Admitting: Licensed Clinical Social Worker

## 2022-12-22 DIAGNOSIS — I700 Atherosclerosis of aorta: Secondary | ICD-10-CM | POA: Diagnosis not present

## 2022-12-22 DIAGNOSIS — M150 Primary generalized (osteo)arthritis: Secondary | ICD-10-CM | POA: Diagnosis not present

## 2022-12-22 DIAGNOSIS — N390 Urinary tract infection, site not specified: Secondary | ICD-10-CM | POA: Diagnosis not present

## 2022-12-22 DIAGNOSIS — F32A Depression, unspecified: Secondary | ICD-10-CM | POA: Diagnosis not present

## 2022-12-22 DIAGNOSIS — I890 Lymphedema, not elsewhere classified: Secondary | ICD-10-CM | POA: Diagnosis not present

## 2022-12-22 DIAGNOSIS — I10 Essential (primary) hypertension: Secondary | ICD-10-CM | POA: Diagnosis not present

## 2022-12-22 NOTE — Patient Outreach (Signed)
  Care Coordination   Follow Up Visit Note   12/20/2022 Name: Jacilyn Sanpedro [facility] MRN: [id number] DOB: 1940/06/23  Lirio Bach Cobin is a 82 y.o. year old female who sees Jimmey Ralph, Katina Degree, MD for primary care. I spoke with  Ardith Dark Gosnell and caregiver, [name], by phone today.  What matters to the patients health and wellness today?  Pain management    Goals Addressed             This Visit's Progress    Obtain Supportive Resources-Managing MH Symptoms   On track    Activities and task to complete in order to accomplish goals.   Keep all upcoming appointments discussed today Continue with compliance of taking medication prescribed by Doctor. Pt's Caregiver will f/u with Oncology regarding medication requests Implement healthy coping skills discussed to assist with management of symptoms         SDOH assessments and interventions completed:  No     Care Coordination Interventions:  Yes, provided  Interventions Today    Flowsheet Row Most Recent Value  Chronic Disease   Chronic disease during today's visit Hypertension (HTN), Other  [Breast cancer and depression]  General Interventions   General Interventions Discussed/Reviewed General Interventions Reviewed, Doctor Visits  [Pt continues to endorse concerns with pain management. Per chart review, specialist is aware and will contact pt upon review of meds]  Doctor Visits Discussed/Reviewed Doctor Visits Reviewed  Mental Health Interventions   Mental Health Discussed/Reviewed Mental Health Reviewed, Coping Strategies, Depression, Anxiety  [Pain management. Caregiver transferred POA to [employer] to assist with multiple estates. Visits 3 x weekly and calls daily.]  Nutrition Interventions   Nutrition Discussed/Reviewed Nutrition Reviewed  Pharmacy Interventions   Pharmacy Dicussed/Reviewed Pharmacy Topics Reviewed, Medication Adherence, Medications and their functions       Follow up plan: Follow up  call scheduled for 2-4 weeks    Encounter Outcome:  Pt. Visit Completed   [name], MSW, LCSW [facility] Care Management [facility] Health  [facility] [email or web address] Phone [phone] 8:11 AM

## 2022-12-22 NOTE — Patient Instructions (Signed)
Visit Information  Thank you for taking time to visit with me today. Please don't hesitate to contact me if I can be of assistance to you.   Following are the goals we discussed today:   Goals Addressed             This Visit's Progress    Obtain Supportive Resources-Managing MH Symptoms   On track    Activities and task to complete in order to accomplish goals.   Keep all upcoming appointments discussed today Continue with compliance of taking medication prescribed by Doctor. Pt's Caregiver will f/u with Oncology regarding medication requests Implement healthy coping skills discussed to assist with management of symptoms         Our next appointment is by telephone on 08/06 at 1 PM  Please call the care guide team at [phone] if you need to cancel or reschedule your appointment.   If you are experiencing a Mental Health or Behavioral Health Crisis or need someone to talk to, please call the Suicide and Crisis Lifeline: 988 call 911   The patient verbalized understanding of instructions, educational materials, and care plan provided today and DECLINED offer to receive copy of patient instructions, educational materials, and care plan.   [name], MSW, LCSW [facility] Care Management [place]  [facility] [email or web address] Phone [phone] 8:12 AM

## 2022-12-23 DIAGNOSIS — I10 Essential (primary) hypertension: Secondary | ICD-10-CM | POA: Diagnosis not present

## 2022-12-23 DIAGNOSIS — H9190 Unspecified hearing loss, unspecified ear: Secondary | ICD-10-CM | POA: Diagnosis not present

## 2022-12-23 NOTE — Patient Outreach (Signed)
  Care Coordination   Multidisciplinary Case Review Note    12/23/2022 Name: Alicia Vasquez [facility] MRN: [id number] DOB: Feb 26, 1941  Alicia Vasquez is a 82 y.o. year old female who sees Jimmey Ralph, Katina Degree, MD for primary care.  The  multidisciplinary care team met today to review patient care needs and barriers.    Care Coordination Interventions: Multidisciplinary case discussion to review patient ongoing care coordination needs  Patient to be engaged ongoing by LCSW [name] to address disease management and social support needs   SDOH assessments and interventions completed:  No     Care Coordination Interventions Activated:  Yes   Care Coordination Interventions:  Yes, provided   Follow up plan: Follow up call scheduled for 1-2 weeks    Multidisciplinary Team Attendees:   Fleeta Emmer, RN [name], LCSW Bevelyn Ngo, BSW  Scribe for Multidisciplinary Case Review:   [name], MSW, LCSW [facility] Care Management [facility] Health  [facility] [email or web address] Phone [phone] 7:01 AM

## 2022-12-26 ENCOUNTER — Other Ambulatory Visit: Payer: Self-pay | Admitting: Adult Health

## 2022-12-26 DIAGNOSIS — M150 Primary generalized (osteo)arthritis: Secondary | ICD-10-CM | POA: Diagnosis not present

## 2022-12-26 DIAGNOSIS — I700 Atherosclerosis of aorta: Secondary | ICD-10-CM | POA: Diagnosis not present

## 2022-12-26 DIAGNOSIS — F32A Depression, unspecified: Secondary | ICD-10-CM | POA: Diagnosis not present

## 2022-12-26 DIAGNOSIS — I890 Lymphedema, not elsewhere classified: Secondary | ICD-10-CM | POA: Diagnosis not present

## 2022-12-26 DIAGNOSIS — I10 Essential (primary) hypertension: Secondary | ICD-10-CM | POA: Diagnosis not present

## 2022-12-26 DIAGNOSIS — N390 Urinary tract infection, site not specified: Secondary | ICD-10-CM | POA: Diagnosis not present

## 2022-12-30 ENCOUNTER — Ambulatory Visit: Payer: Medicare Other | Admitting: Family Medicine

## 2022-12-30 DIAGNOSIS — I890 Lymphedema, not elsewhere classified: Secondary | ICD-10-CM | POA: Diagnosis not present

## 2022-12-30 DIAGNOSIS — I700 Atherosclerosis of aorta: Secondary | ICD-10-CM | POA: Diagnosis not present

## 2022-12-30 DIAGNOSIS — I10 Essential (primary) hypertension: Secondary | ICD-10-CM | POA: Diagnosis not present

## 2022-12-30 DIAGNOSIS — N390 Urinary tract infection, site not specified: Secondary | ICD-10-CM | POA: Diagnosis not present

## 2022-12-30 DIAGNOSIS — M150 Primary generalized (osteo)arthritis: Secondary | ICD-10-CM | POA: Diagnosis not present

## 2022-12-30 DIAGNOSIS — F32A Depression, unspecified: Secondary | ICD-10-CM | POA: Diagnosis not present

## 2023-01-03 ENCOUNTER — Ambulatory Visit: Payer: Self-pay | Admitting: Licensed Clinical Social Worker

## 2023-01-03 ENCOUNTER — Other Ambulatory Visit: Payer: Self-pay | Admitting: Nurse Practitioner

## 2023-01-03 DIAGNOSIS — R110 Nausea: Secondary | ICD-10-CM

## 2023-01-03 DIAGNOSIS — Z515 Encounter for palliative care: Secondary | ICD-10-CM

## 2023-01-03 DIAGNOSIS — M84459A Pathological fracture, hip, unspecified, initial encounter for fracture: Secondary | ICD-10-CM

## 2023-01-03 DIAGNOSIS — M150 Primary generalized (osteo)arthritis: Secondary | ICD-10-CM | POA: Diagnosis not present

## 2023-01-03 DIAGNOSIS — G893 Neoplasm related pain (acute) (chronic): Secondary | ICD-10-CM

## 2023-01-03 DIAGNOSIS — I10 Essential (primary) hypertension: Secondary | ICD-10-CM | POA: Diagnosis not present

## 2023-01-03 DIAGNOSIS — C7951 Secondary malignant neoplasm of bone: Secondary | ICD-10-CM

## 2023-01-03 DIAGNOSIS — I890 Lymphedema, not elsewhere classified: Secondary | ICD-10-CM | POA: Diagnosis not present

## 2023-01-03 DIAGNOSIS — N390 Urinary tract infection, site not specified: Secondary | ICD-10-CM | POA: Diagnosis not present

## 2023-01-03 DIAGNOSIS — I700 Atherosclerosis of aorta: Secondary | ICD-10-CM | POA: Diagnosis not present

## 2023-01-03 DIAGNOSIS — F32A Depression, unspecified: Secondary | ICD-10-CM | POA: Diagnosis not present

## 2023-01-04 ENCOUNTER — Encounter: Payer: Self-pay | Admitting: Oncology

## 2023-01-04 ENCOUNTER — Telehealth: Payer: Self-pay

## 2023-01-04 DIAGNOSIS — I890 Lymphedema, not elsewhere classified: Secondary | ICD-10-CM | POA: Diagnosis not present

## 2023-01-04 DIAGNOSIS — N390 Urinary tract infection, site not specified: Secondary | ICD-10-CM | POA: Diagnosis not present

## 2023-01-04 DIAGNOSIS — M150 Primary generalized (osteo)arthritis: Secondary | ICD-10-CM | POA: Diagnosis not present

## 2023-01-04 DIAGNOSIS — I10 Essential (primary) hypertension: Secondary | ICD-10-CM | POA: Diagnosis not present

## 2023-01-04 DIAGNOSIS — I700 Atherosclerosis of aorta: Secondary | ICD-10-CM | POA: Diagnosis not present

## 2023-01-04 DIAGNOSIS — F32A Depression, unspecified: Secondary | ICD-10-CM | POA: Diagnosis not present

## 2023-01-04 DIAGNOSIS — F4321 Adjustment disorder with depressed mood: Secondary | ICD-10-CM | POA: Diagnosis not present

## 2023-01-04 NOTE — Telephone Encounter (Signed)
Pt refill request sent in via mychart, this RN confirmed with [facility] that they di not fill medications and then this RN confirmed [facility] delivery address [facility], pts preferred pharmacy. See new orders for refill

## 2023-01-05 NOTE — Patient Instructions (Signed)
Visit Information  Thank you for taking time to visit with me today. Please don't hesitate to contact me if I can be of assistance to you.   Following are the goals we discussed today:   Goals Addressed             This Visit's Progress    Obtain Supportive Resources-Managing MH Symptoms   On track    Activities and task to complete in order to accomplish goals.   Keep all upcoming appointments discussed today Continue with compliance of taking medication prescribed by Doctor. Pt's Caregiver will f/u with Oncology regarding medication requests Implement healthy coping skills discussed to assist with management of symptoms         Our next appointment is by telephone on 08/27 at 11 AM  Please call the care guide team at [phone] if you need to cancel or reschedule your appointment.   If you are experiencing a Mental Health or Behavioral Health Crisis or need someone to talk to, please call the Suicide and Crisis Lifeline: 988 call 911   The patient verbalized understanding of instructions, educational materials, and care plan provided today and DECLINED offer to receive copy of patient instructions, educational materials, and care plan.   [name], MSW, LCSW [facility] Care Management [place]  [facility] [email or web address] Phone [phone] 12:39 PM

## 2023-01-05 NOTE — Patient Outreach (Signed)
  Care Coordination   Follow Up Visit Note   01/03/2023 Name: Alicia Vasquez [facility] MRN: [id number] DOB: [birth year]  Alicia Vasquez is a 82 y.o. year old female who sees Jimmey Ralph, Katina Degree, MD for primary care. I spoke with  Ardith Dark [place] by phone today.  What matters to the patients health and wellness today?  Depression and Pain Management    Goals Addressed             This Visit's Progress    Obtain Supportive Resources-Managing MH Symptoms   On track    Activities and task to complete in order to accomplish goals.   Keep all upcoming appointments discussed today Continue with compliance of taking medication prescribed by Doctor. Pt's Caregiver will f/u with Oncology regarding medication requests Implement healthy coping skills discussed to assist with management of symptoms         SDOH assessments and interventions completed:  Yes  SDOH Interventions Today    Flowsheet Row Most Recent Value  SDOH Interventions   Housing Interventions Intervention Not Indicated  Transportation Interventions Intervention Not Indicated  [Pt can receive transportation through AL]        Care Coordination Interventions:  Yes, provided  Interventions Today    Flowsheet Row Most Recent Value  Chronic Disease   Chronic disease during today's visit Hypertension (HTN), Other  [Cancer and Depression]  General Interventions   General Interventions Discussed/Reviewed General Interventions Reviewed, Doctor Visits, Community Resources  Doctor Visits Discussed/Reviewed Doctor Visits Reviewed  Mental Health Interventions   Mental Health Discussed/Reviewed Mental Health Reviewed, Coping Strategies, Depression, Grief and Loss  Nutrition Interventions   Nutrition Discussed/Reviewed Nutrition Reviewed  Pharmacy Interventions   Pharmacy Dicussed/Reviewed Pharmacy Topics Reviewed, Medication Adherence       Follow up plan: Follow up call scheduled for 2-4 weeks    Encounter Outcome:   Pt. Visit Completed   [name], MSW, LCSW [facility] Care Management [facility] Health  [facility] [email or web address] Phone [phone] 12:38 PM

## 2023-01-08 DIAGNOSIS — R112 Nausea with vomiting, unspecified: Secondary | ICD-10-CM | POA: Diagnosis not present

## 2023-01-08 DIAGNOSIS — Z6835 Body mass index (BMI) 35.0-35.9, adult: Secondary | ICD-10-CM | POA: Diagnosis not present

## 2023-01-08 DIAGNOSIS — K5900 Constipation, unspecified: Secondary | ICD-10-CM | POA: Diagnosis not present

## 2023-01-08 DIAGNOSIS — I10 Essential (primary) hypertension: Secondary | ICD-10-CM | POA: Diagnosis not present

## 2023-01-08 DIAGNOSIS — M150 Primary generalized (osteo)arthritis: Secondary | ICD-10-CM | POA: Diagnosis not present

## 2023-01-08 DIAGNOSIS — E039 Hypothyroidism, unspecified: Secondary | ICD-10-CM | POA: Diagnosis not present

## 2023-01-08 DIAGNOSIS — I890 Lymphedema, not elsewhere classified: Secondary | ICD-10-CM | POA: Diagnosis not present

## 2023-01-08 DIAGNOSIS — Z86718 Personal history of other venous thrombosis and embolism: Secondary | ICD-10-CM | POA: Diagnosis not present

## 2023-01-08 DIAGNOSIS — F32A Depression, unspecified: Secondary | ICD-10-CM | POA: Diagnosis not present

## 2023-01-08 DIAGNOSIS — F419 Anxiety disorder, unspecified: Secondary | ICD-10-CM | POA: Diagnosis not present

## 2023-01-08 DIAGNOSIS — E559 Vitamin D deficiency, unspecified: Secondary | ICD-10-CM | POA: Diagnosis not present

## 2023-01-08 DIAGNOSIS — Z853 Personal history of malignant neoplasm of breast: Secondary | ICD-10-CM | POA: Diagnosis not present

## 2023-01-08 DIAGNOSIS — N390 Urinary tract infection, site not specified: Secondary | ICD-10-CM | POA: Diagnosis not present

## 2023-01-08 DIAGNOSIS — I700 Atherosclerosis of aorta: Secondary | ICD-10-CM | POA: Diagnosis not present

## 2023-01-09 DIAGNOSIS — M150 Primary generalized (osteo)arthritis: Secondary | ICD-10-CM | POA: Diagnosis not present

## 2023-01-09 DIAGNOSIS — F32A Depression, unspecified: Secondary | ICD-10-CM | POA: Diagnosis not present

## 2023-01-09 DIAGNOSIS — I10 Essential (primary) hypertension: Secondary | ICD-10-CM | POA: Diagnosis not present

## 2023-01-09 DIAGNOSIS — I890 Lymphedema, not elsewhere classified: Secondary | ICD-10-CM | POA: Diagnosis not present

## 2023-01-09 DIAGNOSIS — N390 Urinary tract infection, site not specified: Secondary | ICD-10-CM | POA: Diagnosis not present

## 2023-01-09 DIAGNOSIS — I700 Atherosclerosis of aorta: Secondary | ICD-10-CM | POA: Diagnosis not present

## 2023-01-10 DIAGNOSIS — I1310 Hypertensive heart and chronic kidney disease without heart failure, with stage 1 through stage 4 chronic kidney disease, or unspecified chronic kidney disease: Secondary | ICD-10-CM | POA: Diagnosis not present

## 2023-01-10 DIAGNOSIS — N189 Chronic kidney disease, unspecified: Secondary | ICD-10-CM | POA: Diagnosis not present

## 2023-01-10 DIAGNOSIS — M166 Other bilateral secondary osteoarthritis of hip: Secondary | ICD-10-CM | POA: Diagnosis not present

## 2023-01-10 DIAGNOSIS — E785 Hyperlipidemia, unspecified: Secondary | ICD-10-CM | POA: Diagnosis not present

## 2023-01-11 ENCOUNTER — Ambulatory Visit (INDEPENDENT_AMBULATORY_CARE_PROVIDER_SITE_OTHER): Payer: Medicare Other | Admitting: Podiatry

## 2023-01-11 ENCOUNTER — Encounter: Payer: Self-pay | Admitting: Podiatry

## 2023-01-11 DIAGNOSIS — M79675 Pain in left toe(s): Secondary | ICD-10-CM | POA: Diagnosis not present

## 2023-01-11 DIAGNOSIS — N390 Urinary tract infection, site not specified: Secondary | ICD-10-CM | POA: Diagnosis not present

## 2023-01-11 DIAGNOSIS — D689 Coagulation defect, unspecified: Secondary | ICD-10-CM | POA: Diagnosis not present

## 2023-01-11 DIAGNOSIS — L600 Ingrowing nail: Secondary | ICD-10-CM | POA: Diagnosis not present

## 2023-01-11 DIAGNOSIS — M79674 Pain in right toe(s): Secondary | ICD-10-CM

## 2023-01-11 DIAGNOSIS — M150 Primary generalized (osteo)arthritis: Secondary | ICD-10-CM | POA: Diagnosis not present

## 2023-01-11 DIAGNOSIS — I890 Lymphedema, not elsewhere classified: Secondary | ICD-10-CM | POA: Diagnosis not present

## 2023-01-11 DIAGNOSIS — F32A Depression, unspecified: Secondary | ICD-10-CM | POA: Diagnosis not present

## 2023-01-11 DIAGNOSIS — I700 Atherosclerosis of aorta: Secondary | ICD-10-CM | POA: Diagnosis not present

## 2023-01-11 DIAGNOSIS — I10 Essential (primary) hypertension: Secondary | ICD-10-CM | POA: Diagnosis not present

## 2023-01-11 DIAGNOSIS — B351 Tinea unguium: Secondary | ICD-10-CM | POA: Diagnosis not present

## 2023-01-11 DIAGNOSIS — F4321 Adjustment disorder with depressed mood: Secondary | ICD-10-CM | POA: Diagnosis not present

## 2023-01-13 ENCOUNTER — Inpatient Hospital Stay: Payer: Medicare Other

## 2023-01-16 DIAGNOSIS — N390 Urinary tract infection, site not specified: Secondary | ICD-10-CM | POA: Diagnosis not present

## 2023-01-16 DIAGNOSIS — F32A Depression, unspecified: Secondary | ICD-10-CM | POA: Diagnosis not present

## 2023-01-16 DIAGNOSIS — I10 Essential (primary) hypertension: Secondary | ICD-10-CM | POA: Diagnosis not present

## 2023-01-16 DIAGNOSIS — I700 Atherosclerosis of aorta: Secondary | ICD-10-CM | POA: Diagnosis not present

## 2023-01-16 DIAGNOSIS — M150 Primary generalized (osteo)arthritis: Secondary | ICD-10-CM | POA: Diagnosis not present

## 2023-01-16 DIAGNOSIS — I890 Lymphedema, not elsewhere classified: Secondary | ICD-10-CM | POA: Diagnosis not present

## 2023-01-16 NOTE — Progress Notes (Signed)
Subjective:  Patient ID: Alicia Vasquez, female    DOB: 08-09-1940,  MRN: [id number]  Alicia Vasquez presents to clinic today for at risk foot care with h/o clotting disorder and painful elongated mycotic toenails 1-5 bilaterally which are tender when wearing enclosed shoe gear. Pain is relieved with periodic professional debridement.   Patient resides at [facility].  New problem(s): with chief concern of injury to right great toe. Injury occurred today. Injury recurred as a result of a staff member accidentally stepping on right great toe during patient's shower. Patient states aggravating factor(s) is/are direct pressure.  Patient has tried no treatment.  PCP is Cecille Rubin, FNP.  Allergies  Allergen Reactions   Anesthetics, Amide Hypertension   Benadryl [Diphenhydramine Hcl] Other (See Comments)    Dizziness   Carbocaine [Mepivacaine Hcl] Hypertension   Codeine Other (See Comments)    Dizziness   Epinephrine Hypertension   Sulfa Antibiotics Other (See Comments)    dizziness   Diphenhydramine    Latex Other (See Comments)    Blisters in mouth   Penicillins Rash    Has patient had a PCN reaction causing immediate rash, facial/tongue/throat swelling, SOB or lightheadedness with hypotension: Unknown Has patient had a PCN reaction causing severe rash involving mucus membranes or skin necrosis: Unknown Has patient had a PCN reaction that required hospitalization: Unknown Has patient had a PCN reaction occurring within the last 10 years: Unknown If all of the above answers are "NO", then may proceed with Cephalosporin use.    Tramadol Other (See Comments)    Sedation.     Review of Systems: Negative except as noted in the HPI.  Objective: No changes noted in today's physical examination. There were no vitals filed for this visit. Alicia Vasquez is a pleasant 82 y.o. female morbidly obese in NAD. AAO x 3.  Vascular Examination: CFT <3  seconds b/l LE. Faintly palpable pedal pulses b/l LE. Pedal hair absent b/l LE. Skin temperature gradient WNL b/l. No pain with calf compression b/l. No edema b/l LE. No cyanosis or clubbing noted b/l LE.  Neurological Examination: Sensation grossly intact b/l with 10 gram monofilament. Vibratory sensation intact b/l.   Dermatological Examination: Pedal skin with normal turgor, texture and tone b/l. Toenails 2-5 b/l thick, discolored, elongated with subungual debris and pain on dorsal palpation. No hyperkeratotic lesions noted b/l.   Incurvated nailplate both borders of left hallux. Nail border hypertrophy absent. There is tenderness to palpation. Sign(s) of infection: no clinical signs of infection noted on examination today..  Right great toe nail plate intact with tenderness to palpation. Distal edge embedded into skin. No ecchymosis, no onycholysis, no erythema, no edema, no purulence.  Musculoskeletal Examination: Muscle strength 5/5 to b/l LE. No pain, crepitus or joint limitation noted with ROM bilateral LE. No gross bony deformities bilaterally. Utilizes wheelchair for mobility assistance.  Radiographs: None  Assessment/Plan: 1. Pain due to onychomycosis of toenails of both feet   2. Embedded toenail   3. Pain in right toe(s)   4. Blood clotting disorder (HCC)      -Patient was evaluated and treated. All patient's and/or POA's questions/concerns answered on today's visit. -Consent given for treatment as described below: -Facility to continue fall precautions and pressure precautions. -Order written for facility to apply triple antibiotic ointment to right great toe once daily for one week. -Patient to continue soft, supportive shoe gear daily. -Toenails were debrided in length and girth 1-5 left foot and  2-5 right foot with sterile nail nippers and dremel without iatrogenic bleeding.  -Embedded nailplate right great toe gently debrided and curretaged without incident. Digit  cleansed with alcohol and TAO/band-aid applied. Patient noted pain relief post treatment today. -Patient/POA to call should there be question/concern in the interim.   Return in about 3 months (around 04/13/2023).  Freddie Breech, DPM

## 2023-01-17 DIAGNOSIS — I1310 Hypertensive heart and chronic kidney disease without heart failure, with stage 1 through stage 4 chronic kidney disease, or unspecified chronic kidney disease: Secondary | ICD-10-CM | POA: Diagnosis not present

## 2023-01-17 DIAGNOSIS — N189 Chronic kidney disease, unspecified: Secondary | ICD-10-CM | POA: Diagnosis not present

## 2023-01-17 DIAGNOSIS — E039 Hypothyroidism, unspecified: Secondary | ICD-10-CM | POA: Diagnosis not present

## 2023-01-18 DIAGNOSIS — N390 Urinary tract infection, site not specified: Secondary | ICD-10-CM | POA: Diagnosis not present

## 2023-01-18 DIAGNOSIS — I890 Lymphedema, not elsewhere classified: Secondary | ICD-10-CM | POA: Diagnosis not present

## 2023-01-18 DIAGNOSIS — F32A Depression, unspecified: Secondary | ICD-10-CM | POA: Diagnosis not present

## 2023-01-18 DIAGNOSIS — I10 Essential (primary) hypertension: Secondary | ICD-10-CM | POA: Diagnosis not present

## 2023-01-18 DIAGNOSIS — M150 Primary generalized (osteo)arthritis: Secondary | ICD-10-CM | POA: Diagnosis not present

## 2023-01-18 DIAGNOSIS — I700 Atherosclerosis of aorta: Secondary | ICD-10-CM | POA: Diagnosis not present

## 2023-01-19 NOTE — Progress Notes (Signed)
Palliative Medicine [facility]  [phone] [phone]   Name: Alicia Vasquez Date: 01/19/2023 MRN: [id number]  DOB: 03-25-1941  Patient Care Team: Cecille Rubin, FNP as PCP - General (Nurse Practitioner) Carlus Pavlov, MD as Consulting Physician (Internal Medicine) Charlynne Pander, MD as Referring Physician (Internal Medicine) Regal, Kirstie Peri, DPM as Consulting Physician (Podiatry) Alfredo Martinez, MD as Consulting Physician (Urology) Rachel Moulds, MD as Consulting Physician (Hematology and Oncology) Durene Romans, MD as Consulting Physician (Orthopedic Surgery) Bridgett Larsson, LCSW as Social Worker (Licensed Clinical Social Worker)    INTERVAL HISTORY: Alicia Vasquez is a 82 y.o. female with oncology medical history including right breast cancer ER positive s/p adjuvant radiation (2015), found to have metastatic disease August 2018 s/p left total hip replacement, multiple compression fractures, currently on fulvestrant therapy.  Palliative ask to see for symptom management and goals of care.   SOCIAL HISTORY:     reports that she has never smoked. She has never used smokeless tobacco. She reports that she does not drink alcohol and does not use drugs.  ADVANCE DIRECTIVES:    CODE STATUS:   PAST MEDICAL HISTORY: 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    Hypertension    Hypothyroid    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   Personal history of radiation therapy    Pneumonia    PONV (postoperative nausea and vomiting)    Radiation 11/21/13-01/07/14   Right Breast/Supraclavicular    ALLERGIES:  is allergic to anesthetics, amide; benadryl [diphenhydramine hcl]; carbocaine [mepivacaine hcl]; codeine;  epinephrine; sulfa antibiotics; diphenhydramine; latex; penicillins; and tramadol.  MEDICATIONS:  Current Outpatient Medications  Medication Sig Dispense Refill   cholecalciferol (VITAMIN D3) 25 MCG (1000 UNIT) tablet Take 1,000 Units by mouth daily. Current dose 5000 units     Elastic Bandages & Supports (V-5 HIGH COMPRESSION HOSE) MISC Use daily as needed for leg edema 2 each 0   ketoconazole (NIZORAL) 2 % cream Apply 1 Application topically 2 (two) times daily. 60 g 0   levothyroxine (SYNTHROID) 112 MCG tablet Take 1 tablet (112 mcg total) by mouth daily before breakfast. 45 tablet 5   lisinopril (ZESTRIL) 10 MG tablet TAKE ONE TABLET BY MOUTH ONCE DAILY 90 tablet 1   naloxone (NARCAN) 0.4 MG/ML injection Inject 1 mL (0.4 mg total) into the muscle as needed. 1 mL 0   ondansetron (ZOFRAN) 8 MG tablet TAKE ONE TABLET BY MOUTH EVERY 8 HOURS AS NEEDED FOR NAUSEA AND vomiting 90 tablet 2   oxyCODONE (OXY IR/ROXICODONE) 5 MG immediate release tablet Take 1 tablet (5 mg total) by mouth every 6 (six) hours as needed for severe pain. 60 tablet 0   pantoprazole (PROTONIX) 40 MG tablet Take 1 tablet (40 mg total) by mouth daily before breakfast. 30 tablet 2   No current facility-administered medications for this visit.    VITAL SIGNS: There were no vitals taken for this visit. There were no vitals filed for this visit.  Estimated body mass index is 35.7 kg/m as calculated from the following:   Height as of 11/24/22: 5\' 4"  (1.626 m).   Weight as of 10/10/22: 208 lb (94.3 kg).    PERFORMANCE STATUS (ECOG) :  2 - Symptomatic, <50% confined to bed  Assessment NAD, in wheelchair RRR Normal breathing pattern AAO x4  Impression  Alicia Vasquez presented to clinic today for follow-up. Her close friend is present with her. She is now a resident at [place], [place]. Her nurse, Dorann Lodge also was on speakerphone during visit to discuss medications. Alicia Vasquez denies nausea, vomiting, constipation, or  diarrhea. Occasional incontinence. Wears a brief. She looks much better and well groomed than previous visits. Shares she is happy at current facility with some minor challenges. Appetite is good.   Neoplasm related pain Alicia Vasquez states her pain is controlled on current regimen. She does have some discomfort at night however she feels if she was to receive Tylenol prior to bedtime this would be sufficient. Reports occasional discomfort on some days that causes her to become tearful but this does not occur daily as it did in the past.   [place] reviewed her medication schedule with me. All medications are administered by the nursing staff. Alicia Vasquez has been consistently receiving her Oxycodone at least 2-3 times daily and Tylenol 650mg  twice daily. We discussed beginning a scheduled nightly dose of Tylenol 650mg . She will continue with oxycodone as needed. [name], RN confirms patient has 22 pills left of her prescription. She has not received her August refill from [facility], confirming this will be delivered directly to the nursing staff.   Alicia Vasquez and her care team understands we will continue to support and follow. They have our contact information to call with any questions or unmet palliative needs.   Constipation Controlled with stool softeners daily.   I discussed the importance of continued conversation with family and their medical providers regarding overall plan of care and treatment options, ensuring decisions are within the context of the patients values and GOCs.  PLAN:  Oxycodone 5 mg every 6 hours as needed.  Zofran 8 mg every 8 hours as needed for nausea.  Patient states she generally has nausea at least 2-3 days post Faslodex injection Colace twice daily Tylenol 650mg  every 8 hrs as needed/at bedtime Protonix 40 mg daily  Ongoing symptom management and support as needed I will plan to see patient back in 3-4 weeks in collaboration with other oncology appointments.     Patient expressed understanding and was in agreement with this plan. She also understands that She can call the clinic at any time with any questions, concerns, or complaints.    Any controlled substances utilized were prescribed in the context of palliative care. PDMP has been reviewed.    Visit consisted of counseling and education dealing with the complex and emotionally intense issues of symptom management and palliative care in the setting of serious and potentially life-threatening illness.Greater than 50%  of this time was spent counseling and coordinating care related to the above assessment and plan.  [name], AGPCNP-BC  Palliative Medicine Team/[facility]  *Please note that this is a verbal dictation therefore any spelling or grammatical errors are due to the "[employer] One" system interpretation.

## 2023-01-20 ENCOUNTER — Inpatient Hospital Stay: Payer: Medicare Other | Attending: Oncology | Admitting: Hematology and Oncology

## 2023-01-20 ENCOUNTER — Inpatient Hospital Stay: Payer: Medicare Other

## 2023-01-20 ENCOUNTER — Encounter: Payer: Self-pay | Admitting: Nurse Practitioner

## 2023-01-20 ENCOUNTER — Inpatient Hospital Stay (HOSPITAL_BASED_OUTPATIENT_CLINIC_OR_DEPARTMENT_OTHER): Payer: Medicare Other | Admitting: Nurse Practitioner

## 2023-01-20 DIAGNOSIS — C50411 Malignant neoplasm of upper-outer quadrant of right female breast: Secondary | ICD-10-CM | POA: Diagnosis not present

## 2023-01-20 DIAGNOSIS — C7951 Secondary malignant neoplasm of bone: Secondary | ICD-10-CM

## 2023-01-20 DIAGNOSIS — M8580 Other specified disorders of bone density and structure, unspecified site: Secondary | ICD-10-CM | POA: Diagnosis not present

## 2023-01-20 DIAGNOSIS — Z7901 Long term (current) use of anticoagulants: Secondary | ICD-10-CM | POA: Diagnosis not present

## 2023-01-20 DIAGNOSIS — M84659P Pathological fracture in other disease, hip, unspecified, subsequent encounter for fracture with malunion: Secondary | ICD-10-CM

## 2023-01-20 DIAGNOSIS — M899 Disorder of bone, unspecified: Secondary | ICD-10-CM

## 2023-01-20 DIAGNOSIS — Z923 Personal history of irradiation: Secondary | ICD-10-CM | POA: Insufficient documentation

## 2023-01-20 DIAGNOSIS — Z5111 Encounter for antineoplastic chemotherapy: Secondary | ICD-10-CM | POA: Diagnosis not present

## 2023-01-20 DIAGNOSIS — Z170 Estrogen receptor positive status [ER+]: Secondary | ICD-10-CM

## 2023-01-20 DIAGNOSIS — G893 Neoplasm related pain (acute) (chronic): Secondary | ICD-10-CM

## 2023-01-20 DIAGNOSIS — R530 Neoplastic (malignant) related fatigue: Secondary | ICD-10-CM

## 2023-01-20 DIAGNOSIS — Z515 Encounter for palliative care: Secondary | ICD-10-CM | POA: Diagnosis not present

## 2023-01-20 DIAGNOSIS — Z86718 Personal history of other venous thrombosis and embolism: Secondary | ICD-10-CM | POA: Diagnosis not present

## 2023-01-20 LAB — CMP (CANCER CENTER ONLY)
ALT: 27 U/L (ref 0–44)
AST: 29 U/L (ref 15–41)
Albumin: 3.8 g/dL (ref 3.5–5.0)
Alkaline Phosphatase: 194 U/L — ABNORMAL HIGH (ref 38–126)
Anion gap: 7 (ref 5–15)
BUN: 11 mg/dL (ref 8–23)
CO2: 28 mmol/L (ref 22–32)
Calcium: 11.1 mg/dL — ABNORMAL HIGH (ref 8.9–10.3)
Chloride: 103 mmol/L (ref 98–111)
Creatinine: 1 mg/dL (ref 0.44–1.00)
GFR, Estimated: 56 mL/min — ABNORMAL LOW (ref >60)
Glucose, Bld: 104 mg/dL — ABNORMAL HIGH (ref 70–99)
Potassium: 4.4 mmol/L (ref 3.5–5.1)
Sodium: 138 mmol/L (ref 135–145)
Total Bilirubin: 0.5 mg/dL (ref 0.3–1.2)
Total Protein: 7.1 g/dL (ref 6.5–8.1)

## 2023-01-20 LAB — CBC WITH DIFFERENTIAL (CANCER CENTER ONLY)
Abs Immature Granulocytes: 0.07 10*3/uL (ref 0.00–0.07)
Basophils Absolute: 0.1 10*3/uL (ref 0.0–0.1)
Basophils Relative: 1 %
Eosinophils Absolute: 0.3 10*3/uL (ref 0.0–0.5)
Eosinophils Relative: 3 %
HCT: 36.9 % (ref 36.0–46.0)
Hemoglobin: 12.3 g/dL (ref 12.0–15.0)
Immature Granulocytes: 1 %
Lymphocytes Relative: 16 %
Lymphs Abs: 1.5 10*3/uL (ref 0.7–4.0)
MCH: 29.1 pg (ref 26.0–34.0)
MCHC: 33.3 g/dL (ref 30.0–36.0)
MCV: 87.4 fL (ref 80.0–100.0)
Monocytes Absolute: 1.5 10*3/uL — ABNORMAL HIGH (ref 0.1–1.0)
Monocytes Relative: 17 %
Neutro Abs: 5.6 10*3/uL (ref 1.7–7.7)
Neutrophils Relative %: 62 %
Platelet Count: 294 10*3/uL (ref 150–400)
RBC: 4.22 MIL/uL (ref 3.87–5.11)
RDW: 16.5 % — ABNORMAL HIGH (ref 11.5–15.5)
WBC Count: 9.1 10*3/uL (ref 4.0–10.5)
nRBC: 0 % (ref 0.0–0.2)

## 2023-01-20 MED ORDER — NYSTATIN 100000 UNIT/GM EX POWD: 1.0000 | Freq: Three times a day (TID) | CUTANEOUS | 0 refills | Status: DC

## 2023-01-20 MED ORDER — FULVESTRANT 250 MG/5ML IM SOSY
500.0000 mg | PREFILLED_SYRINGE | Freq: Once | INTRAMUSCULAR | Status: AC
  Administered 2023-01-20: 500 mg via INTRAMUSCULAR
  Filled 2023-01-20: qty 10

## 2023-01-20 NOTE — Progress Notes (Signed)
[facility] Cancer Follow up:    Alicia Rubin, FNP [address] [place] [address]   DIAGNOSIS:  Cancer Staging  Malignant neoplasm of upper-outer quadrant of right breast in female, estrogen receptor positive (HCC) Staging form: Breast, AJCC 7th Edition - Clinical: Stage IIA (T1, N1, cM0) - Unsigned Specimen type: Core Needle Biopsy Histopathologic type: 9931 Laterality: Right Staging comments: Staged at breast conference 07/24/13.  - Pathologic: Stage IV (M1) - Unsigned Specimen type: Core Needle Biopsy Histopathologic type: 9931 Laterality: Right   SUMMARY OF ONCOLOGIC HISTORY: 82 y.o.  woman status post right breast upper outer quadrant lumpectomy and sentinel lymph node sampling 09/09/2013 for an mpT1c pN1a, stage IIA invasive ductal carcinoma, estrogen and progesterone receptor both 100% positive with strong staining intensity, MIB-1 of 17% and no HER-2 amplification   (1) additional surgery for margin clearance 09/16/2013 obtained negative margins   (2) Oncotype DX recurrence score of 4 predicts a risk of outside the breast recurrence within 10 years of 7% if the patient's only systemic therapy is tamoxifen for 5 years. It also predicts no benefit from chemotherapy   (3) adjuvant radiation completed 01/07/2014   (4) anastrozole started 02/27/2014 stopped within 2 weeks because of arm swelling.              (a) bone density April 2016 showed osteopenia, with a t-score of -1.6             (b) anastrozole resumed 12/17/2015             (c) Bone density 09/20/2016 showed a T score of -2.2   (5) history of left lower extremity DVT 11/23/2012, initially on rivaroxaban, which caused chest pain, switch to Coumadin July 2014  METASTATIC DISEASE: August 2018 (6) status post left total hip replacement 01/16/2017 for estrogen receptor positive adenocarcinoma.             (a) CA 27-29 was 46.4 as of 04/18/2017             (b) chest  CT scan 05/02/2017 shows no lung or liver lesion concern; it does show aortic atherosclerosis             (c) baseline bone scan 05/02/2017 was negative             (d) PET scan 09/12/2017 shows no active disease, including bone             (e) PET scan and CT chest on 05/29/2018 show no active disease             (f) chest CT and bone scan 03/18/2019 showed no evidence of active disease             (g) lumbar spine MRI 01/06/2021 shows multiple compression fractures but no obvious metastases; noncontrast cervical spine and head CT scans 01/04/2021 showed no evidence of neoplastic disease   (7) fulvestrant started 04/18/2017   (8) denosumab/Xgeva started 05/16/2017             (a) changed to every 12-weeks after 10/03/2017 dose              (b) held starting with 06/13/2018 dose due to dental concerns   (9) unprovoked deep vein thrombosis involving the left posterior tibial v documented 11/23/2012, resolved on repeat 06/27/2013 and not recurrent on multiple Dopplers since, most recent 03/06/2018             (a) on chronic anticoagulation  with warfarin given ongoing risks (stage IV breast cancer, relative immobility secondary to claudication. This has been held because of falls and increased risk of bleeding  CURRENT THERAPY: Faslodex  INTERVAL HISTORY: Alicia Vasquez 82 y.o. female returns for f/u of her metastatic breast cancer. Alicia Vasquez is with her today. Alicia Vasquez is now in assisted living. She says situation is so much better. She says she has no appetite, mostly because food is not good. She has an ongoing tooth problems, yet to see a dentist. She has ongoing back pain, follows up with nicki for pain management. Rest of the pertinent 10 point ROS reviewed and neg.  Patient Active Problem List   Diagnosis Date Noted   VTE (venous thromboembolism) 11/24/2022   Poor social situation 09/02/2022   GERD (gastroesophageal reflux disease) 06/17/2021   Bilateral leg edema 06/03/2021    Pressure injury of left buttock, stage 1 06/03/2021   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   Debility 04/27/2021   Morbid (severe) obesity due to excess calories (HCC) 05/31/2019   Aortic atherosclerosis (HCC) 06/13/2017   Malignant neoplasm metastatic to bone (HCC) 01/25/2017   Endometrial hyperplasia 01/16/2017   Lytic bone lesion of left femur 01/15/2017   Hip fracture, pathological (HCC) 01/15/2017   Lymphedema 09/17/2015   Bilateral knee pain 04/03/2015   Arm edema 08/28/2014   Vitamin D deficiency 04/23/2014   Hyperparathyroidism (HCC) 04/23/2014   Depression 07/18/2013   Malignant neoplasm of upper-outer quadrant of right breast in female, estrogen receptor positive (HCC) 07/15/2013   Overactive bladder 01/30/2013   Essential hypertension 09/27/2011   Hearing loss 09/27/2011   Seasonal allergies 09/27/2011   Hypothyroidism 08/26/2011    is allergic to anesthetics, amide; benadryl [diphenhydramine hcl]; carbocaine [mepivacaine hcl]; codeine; epinephrine; sulfa antibiotics; diphenhydramine; latex; penicillins; and tramadol.  MEDICAL HISTORY: 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    Hypertension    Hypothyroid    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   Personal history of radiation therapy    Pneumonia    PONV (postoperative nausea and vomiting)    Radiation 11/21/13-01/07/14   Right Breast/Supraclavicular    SURGICAL HISTORY: Past Surgical History:  Procedure Laterality Date   BREAST LUMPECTOMY Left 2015   BREAST LUMPECTOMY WITH RADIOACTIVE SEED LOCALIZATION Right 09/09/2013   Procedure: BREAST LUMPECTOMY WITH RADIOACTIVE SEED LOCALIZATION WITH AXILLARY NODE EXCISION;  Surgeon: Emelia Loron, MD;  Location: [facility];  Service: General;  Laterality: Right;    CYSTOSCOPY W/ URETERAL STENT PLACEMENT Right 01/07/2021   Procedure: CYSTOSCOPY WITH RETROGRADE PYELOGRAM/URETERAL STENT PLACEMENT;  Surgeon: Alfredo Martinez, MD;  Location: [facility];  Service: Urology;  Laterality: Right;   CYSTOSCOPY/URETEROSCOPY/HOLMIUM LASER/STENT PLACEMENT Right 02/08/2021   Procedure: CYSTOSCOPY, RIGHT URETEROSCOPY, RIGHT RETRGRADE PYELOGRAM, HOLMIUM LASER/STENT PLACEMENT;  Surgeon: Crista Elliot, MD;  Location: [facility];  Service: Urology;  Laterality: Right;   DENTAL SURGERY  04/19/2012   13 TEETH REMOVED   DILATION AND CURETTAGE OF UTERUS     IR RADIOLOGIST EVAL & MGMT  01/21/2021   ORIF PERIPROSTHETIC FRACTURE Left 01/31/2017   Procedure: REVISION and OPEN REDUCTION INTERNAL FIXATION (ORIF) PERIPROSTHETIC FRACTURE LEFT HIP;  Surgeon: Durene Romans, MD;  Location: [facility];  Service: Orthopedics;  Laterality: Left;  120 mins  RE-EXCISION OF BREAST LUMPECTOMY Right 09/24/2013   Procedure: RE-EXCISION OF RIGHT BREAST LUMPECTOMY;  Surgeon: Emelia Loron, MD;  Location: [facility];  Service: General;  Laterality: Right;   TOTAL HIP ARTHROPLASTY Left 01/16/2017   Procedure: TOTAL HIP ARTHROPLASTY POSTERIOR;  Surgeon: Durene Romans, MD;  Location: [facility];  Service: Orthopedics;  Laterality: Left;    SOCIAL HISTORY: Social History   Socioeconomic History   Marital status: Widowed    Spouse name: Not on file   Number of children: 1   Years of education: Not on file   Highest education level: Not on file  Occupational History   Not on file  Tobacco Use   Smoking status: Never   Smokeless tobacco: Never  Vaping Use   Vaping status: Never Used  Substance and Sexual Activity   Alcohol use: No    Alcohol/week: 0.0 standard drinks of alcohol   Drug use: No   Sexual activity: Never  Other Topics Concern   Not on file  Social History Narrative   Exercise: yard work.   Social Determinants of Health   Financial Resource Strain: High Risk  (10/10/2022)   Overall Financial Resource Strain (CARDIA)    Difficulty of Paying Living Expenses: Hard  Food Insecurity: No Food Insecurity (12/12/2022)   Hunger Vital Sign    Worried About Running Out of Food in the Last Year: Never true    Ran Out of Food in the Last Year: Never true  Recent Concern: Food Insecurity - Food Insecurity Present (10/03/2022)   Hunger Vital Sign    Worried About Running Out of Food in the Last Year: Often true    Ran Out of Food in the Last Year: Often true  Transportation Needs: No Transportation Needs (01/03/2023)   PRAPARE - Administrator, Civil Service (Medical): No    Lack of Transportation (Non-Medical): No  Recent Concern: Transportation Needs - Unmet Transportation Needs (12/12/2022)   PRAPARE - Transportation    Lack of Transportation (Medical): Yes    Lack of Transportation (Non-Medical): Yes  Physical Activity: Inactive (10/10/2022)   Exercise Vital Sign    Days of Exercise per Week: 0 days    Minutes of Exercise per Session: 0 min  Stress: Stress Concern Present (10/10/2022)   [employer] of Occupational Health - Occupational Stress Questionnaire    Feeling of Stress : Very much  Social Connections: Socially Isolated (10/10/2022)   Social Connection and Isolation Panel [NHANES]    Frequency of Communication with Friends and Family: More than three times a week    Frequency of Social Gatherings with Friends and Family: Twice a week    Attends Religious Services: Never    Database administrator or Organizations: No    Attends Banker Meetings: Never    Marital Status: Widowed  Intimate Partner Violence: Not At Risk (10/10/2022)   Humiliation, Afraid, Rape, and Kick questionnaire    Fear of Current or Ex-Partner: No    Emotionally Abused: No    Physically Abused: No    Sexually Abused: No    FAMILY HISTORY: Family History  Problem Relation Age of Onset   Heart disease Brother    Colon cancer Brother     Prostate cancer Brother     Review of Systems  Constitutional:  Negative for appetite change, chills, fatigue, fever and unexpected weight change.  HENT:   Negative for hearing loss, lump/mass and trouble swallowing.   Eyes:  Negative  for eye problems and icterus.  Respiratory:  Negative for chest tightness, cough and shortness of breath.   Cardiovascular:  Negative for chest pain, leg swelling and palpitations.  Gastrointestinal:  Negative for abdominal distention, abdominal pain, constipation, diarrhea, nausea and vomiting.  Endocrine: Negative for hot flashes.  Genitourinary:  Negative for difficulty urinating.   Musculoskeletal:  Negative for arthralgias.  Skin:  Negative for itching and rash.  Neurological:  Negative for dizziness, extremity weakness, headaches and numbness.  Hematological:  Negative for adenopathy. Does not bruise/bleed easily.  Psychiatric/Behavioral:  Negative for depression. The patient is not nervous/anxious.       PHYSICAL EXAMINATION    Vitals:   01/20/23 1149  BP: (!) 144/55  Pulse: 85  Resp: 17  Temp: 97.9 F (36.6 C)  SpO2: 97%    Physical Exam Constitutional:      Appearance: She is obese.     Comments: Patient appears much better today.   HENT:     Head: Normocephalic.  Eyes:     General: No scleral icterus. Cardiovascular:     Rate and Rhythm: Normal rate and regular rhythm.     Pulses: Normal pulses.     Heart sounds: Normal heart sounds.  Pulmonary:     Effort: Pulmonary effort is normal.     Breath sounds: Normal breath sounds.  Abdominal:     General: There is no distension.     Palpations: There is no mass.     Tenderness: There is no abdominal tenderness.  Skin:    General: Skin is warm.  Neurological:     General: No focal deficit present.     Mental Status: She is alert.     LABORATORY DATA:  CBC    Component Value Date/Time   WBC 9.1 01/20/2023 1133   WBC 7.8 11/15/2022 1411   RBC 4.22 01/20/2023 1133    HGB 12.3 01/20/2023 1133   HGB 11.6 05/16/2017 1417   HCT 36.9 01/20/2023 1133   HCT 36.7 05/16/2017 1417   PLT 294 01/20/2023 1133   PLT 323 05/16/2017 1417   MCV 87.4 01/20/2023 1133   MCV 80.8 05/16/2017 1417   MCH 29.1 01/20/2023 1133   MCHC 33.3 01/20/2023 1133   RDW 16.5 (H) 01/20/2023 1133   RDW 16.8 (H) 05/16/2017 1417   LYMPHSABS 1.5 01/20/2023 1133   LYMPHSABS 1.1 05/16/2017 1417   MONOABS 1.5 (H) 01/20/2023 1133   MONOABS 0.8 05/16/2017 1417   EOSABS 0.3 01/20/2023 1133   EOSABS 0.2 05/16/2017 1417   BASOSABS 0.1 01/20/2023 1133   BASOSABS 0.1 05/16/2017 1417    CMP     Component Value Date/Time   NA 138 11/15/2022 1411   NA 141 05/16/2017 1416   K 3.7 11/15/2022 1411   K 3.8 05/16/2017 1416   CL 106 11/15/2022 1411   CO2 26 11/15/2022 1411   CO2 27 05/16/2017 1416   GLUCOSE 120 (H) 11/15/2022 1411   GLUCOSE 140 05/16/2017 1416   BUN 16 11/15/2022 1411   BUN 13.2 05/16/2017 1416   CREATININE 0.94 11/15/2022 1411   CREATININE 1.01 (H) 10/21/2022 1338   CREATININE 0.80 09/22/2017 1555   CREATININE 0.8 05/16/2017 1416   CALCIUM 10.7 (H) 11/15/2022 1411   CALCIUM 11.0 (H) 05/16/2017 1416   PROT 6.8 11/15/2022 1411   PROT 6.9 05/16/2017 1416   ALBUMIN 3.3 (L) 11/15/2022 1411   ALBUMIN 3.6 05/16/2017 1416   AST 21 11/15/2022 1411   AST 17 10/21/2022 1338  AST 13 05/16/2017 1416   ALT 20 11/15/2022 1411   ALT 14 10/21/2022 1338   ALT <6 05/16/2017 1416   ALKPHOS 109 11/15/2022 1411   ALKPHOS 130 05/16/2017 1416   BILITOT 0.7 11/15/2022 1411   BILITOT 0.5 10/21/2022 1338   BILITOT 0.31 05/16/2017 1416   GFRNONAA >60 11/15/2022 1411   GFRNONAA 55 (L) 10/21/2022 1338   GFRNONAA 75 08/19/2015 1602   GFRAA >60 08/07/2019 1305   GFRAA >60 03/21/2018 1417   GFRAA 87 08/19/2015 1602        ASSESSMENT and THERAPY PLAN:   No problem-specific Assessment & Plan notes found for this encounter.  Anayiah Duba Tanton is a 82 y.o. 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   #Metastatic breast cancer involving the bone: --Currently on Fulvestrant injection q 4 weeks.  --Last CT scan and bone scan from 07/28/2021 showed stable compression deformities at T12, L4, and L5. No definite evidence osseous metastasis. She is overdue for her repeat imaging -- She was strongly encouraged to continue Faslodex until progression or other concerns for toxicity.   -- She doesn't have any clinical concerns for progression but once again, we will have to repeat imaging to check on the status of metastatic breast cancer ---  Phone number to call and schedule central scheduling has been provided to her healthcare power of attorney.  I have once again reviewed the role of anticoagulation and why we stopped it in her case.  With regards to pain management optimization, encouraged them to speak to [facility] our palliative care provider who has been managing her pain medication.  They both expressed understanding  Total time spent: 30 min  *Total Encounter Time as defined by the Centers for Medicare and Medicaid Services includes, in addition to the face-to-face time of a patient visit (documented in the note above) non-face-to-face time: obtaining and reviewing outside history, ordering and reviewing medications, tests or procedures, care coordination (communications with other health care professionals or caregivers) and documentation in the medical record.

## 2023-01-20 NOTE — Assessment & Plan Note (Signed)
Alicia Vasquez is an 82 year old woman with history of stage IV breast cancer to the left femur continuing on Faslodex therapy every 4 weeks.  Stage IV breast cancer: She will proceed with treatment today with Faslodex.  Her labs are stable.  She has not yet undergone CT chest abdomen pelvis and bone scan as recommended by Alicia Vasquez.  My nurse is going to call to see if she can help get this scheduled. Right chest wall pain: I ordered plain films of her ribs and chest so we can further evaluate. Poor hygiene: I asked my nurse to page social work for follow up and discussion with the [place] Adult protective services representative. Pain: She is taking oxycodone PRN and will continue this.  She plans to take this when she gets home.  If she has pain not relieved by Oxycodone, I encouraged her to go to the ER.    Alicia Vasquez will return in 4 weeks for labs, f/u with Alicia Vasquez, and her next visit.

## 2023-01-23 DIAGNOSIS — F32A Depression, unspecified: Secondary | ICD-10-CM | POA: Diagnosis not present

## 2023-01-23 DIAGNOSIS — M150 Primary generalized (osteo)arthritis: Secondary | ICD-10-CM | POA: Diagnosis not present

## 2023-01-23 DIAGNOSIS — I10 Essential (primary) hypertension: Secondary | ICD-10-CM | POA: Diagnosis not present

## 2023-01-23 DIAGNOSIS — N390 Urinary tract infection, site not specified: Secondary | ICD-10-CM | POA: Diagnosis not present

## 2023-01-23 DIAGNOSIS — I890 Lymphedema, not elsewhere classified: Secondary | ICD-10-CM | POA: Diagnosis not present

## 2023-01-23 DIAGNOSIS — I700 Atherosclerosis of aorta: Secondary | ICD-10-CM | POA: Diagnosis not present

## 2023-01-24 ENCOUNTER — Ambulatory Visit: Payer: Self-pay | Admitting: Licensed Clinical Social Worker

## 2023-01-26 ENCOUNTER — Telehealth: Payer: Self-pay

## 2023-01-26 NOTE — Telephone Encounter (Signed)
Pt called and LVM c/o tooth pain attempted to call pt back, no answer, LVM and callback number

## 2023-01-26 NOTE — Telephone Encounter (Signed)
Pt advocate, Ellison Hughs, called reporting that the pt has to change pharmacy's as her preferred pharmacy is closing. Pharmacy added to pt chart, no refill needed at this time.

## 2023-01-27 ENCOUNTER — Telehealth: Payer: Self-pay

## 2023-01-27 ENCOUNTER — Telehealth: Payer: Self-pay | Admitting: Nurse Practitioner

## 2023-01-27 DIAGNOSIS — I890 Lymphedema, not elsewhere classified: Secondary | ICD-10-CM | POA: Diagnosis not present

## 2023-01-27 DIAGNOSIS — F32A Depression, unspecified: Secondary | ICD-10-CM | POA: Diagnosis not present

## 2023-01-27 DIAGNOSIS — N390 Urinary tract infection, site not specified: Secondary | ICD-10-CM | POA: Diagnosis not present

## 2023-01-27 DIAGNOSIS — I10 Essential (primary) hypertension: Secondary | ICD-10-CM | POA: Diagnosis not present

## 2023-01-27 DIAGNOSIS — I700 Atherosclerosis of aorta: Secondary | ICD-10-CM | POA: Diagnosis not present

## 2023-01-27 DIAGNOSIS — M150 Primary generalized (osteo)arthritis: Secondary | ICD-10-CM | POA: Diagnosis not present

## 2023-01-27 NOTE — Telephone Encounter (Signed)
Patient need appt

## 2023-01-27 NOTE — Telephone Encounter (Signed)
Patient called in stating she is needing an antibiotic for an issue she is having. It was unclear as to what it was about specifically. It states patient have Abner Greenspan as her PCP instead of Dr. Jimmey Ralph. Patient stated she doesn't know who this is. Please Advise.

## 2023-01-27 NOTE — Patient Instructions (Signed)
Visit Information  Thank you for taking time to visit with me today. Please don't hesitate to contact me if I can be of assistance to you.   Following are the goals we discussed today:   Goals Addressed             This Visit's Progress    Obtain Supportive Resources-Managing MH Symptoms   On track    Activities and task to complete in order to accomplish goals.   Keep all upcoming appointments discussed today Continue with compliance of taking medication prescribed by Doctor. Pt's Caregiver will f/u with Oncology regarding medication requests Implement healthy coping skills discussed to assist with management of symptoms         Our next appointment is by telephone on 09/10 at 11 AM  Please call the care guide team at [phone] if you need to cancel or reschedule your appointment.   If you are experiencing a Mental Health or Behavioral Health Crisis or need someone to talk to, please call the Suicide and Crisis Lifeline: 988 call 911   The patient verbalized understanding of instructions, educational materials, and care plan provided today and DECLINED offer to receive copy of patient instructions, educational materials, and care plan.   [name], MSW, LCSW [facility] Care Management [place]  [facility] [email or web address] Phone [phone] 5:05 AM

## 2023-01-27 NOTE — Patient Outreach (Signed)
  Care Coordination   Follow Up Visit Note   01/24/2023 Name: Alicia Vasquez [facility] MRN: [id number] DOB: 26-Sep-1940  Alicia Vasquez is a 82 y.o. year old female who sees Alicia Rubin, FNP for primary care. I spoke with  Alicia Vasquez by phone today.  What matters to the patients health and wellness today?  Symptom Management and Supportive Resources    Goals Addressed             This Visit's Progress    Obtain Supportive Resources-Managing MH Symptoms   On track    Activities and task to complete in order to accomplish goals.   Keep all upcoming appointments discussed today Continue with compliance of taking medication prescribed by Doctor. Pt's Caregiver will f/u with Oncology regarding medication requests Implement healthy coping skills discussed to assist with management of symptoms         SDOH assessments and interventions completed:  No     Care Coordination Interventions:  Yes, provided  Interventions Today    Flowsheet Row Most Recent Value  Chronic Disease   Chronic disease during today's visit Hypertension (HTN), Other  [Cancer and Depression]  General Interventions   General Interventions Discussed/Reviewed General Interventions Reviewed, [facility], Doctor Visits  Doctor Visits Discussed/Reviewed Doctor Visits Reviewed  Mental Health Interventions   Mental Health Discussed/Reviewed Mental Health Reviewed, Coping Strategies, Anxiety, Depression  Nutrition Interventions   Nutrition Discussed/Reviewed Nutrition Reviewed  Pharmacy Interventions   Pharmacy Dicussed/Reviewed Pharmacy Topics Reviewed, Medication Adherence       Follow up plan: Follow up call scheduled for 1-2 weeks    Encounter Outcome:  Pt. Visit Completed   [name], MSW, LCSW [facility] Care Management [facility]  [facility] [email or web address] Phone [phone] 5:04 AM

## 2023-01-27 NOTE — Telephone Encounter (Signed)
Dr. Jimmey Ralph not listed as PCP any longer. Do you know anything about this?

## 2023-01-27 NOTE — Telephone Encounter (Signed)
Pt called reporting pain from tooth and requesting abx. The pt reports a "hole that goes all the way down to the gum in toe teeth" Pt instructed to take pain medications as prescribed and to keep her dental appointment for Tuesday for potential abx, pt verbalized understanding

## 2023-01-28 DIAGNOSIS — H9190 Unspecified hearing loss, unspecified ear: Secondary | ICD-10-CM | POA: Diagnosis not present

## 2023-01-28 DIAGNOSIS — I10 Essential (primary) hypertension: Secondary | ICD-10-CM | POA: Diagnosis not present

## 2023-01-31 ENCOUNTER — Encounter: Payer: Self-pay | Admitting: Licensed Clinical Social Worker

## 2023-02-01 ENCOUNTER — Telehealth: Payer: Self-pay | Admitting: Licensed Clinical Social Worker

## 2023-02-01 DIAGNOSIS — F4321 Adjustment disorder with depressed mood: Secondary | ICD-10-CM | POA: Diagnosis not present

## 2023-02-01 NOTE — Patient Outreach (Signed)
  Care Coordination   02/01/2023 Name: Alicia Vasquez [name] MRN: [id number] DOB: 1940/11/14   Care Coordination Outreach Attempts:  An unsuccessful telephone outreach was attempted today to offer the patient information about available care coordination services.  Follow Up Plan:  Additional outreach attempts will be made to offer the patient care coordination information and services.   Encounter Outcome:  No Answer   Care Coordination Interventions:  No, not indicated    [name], MSW, LCSW [facility] Care Management [place]  [facility] [email or web address] Phone [phone] 3:24 PM

## 2023-02-02 ENCOUNTER — Ambulatory Visit: Payer: Self-pay | Admitting: Licensed Clinical Social Worker

## 2023-02-02 NOTE — Patient Instructions (Signed)
Visit Information  Thank you for taking time to visit with me today. Please don't hesitate to contact me if I can be of assistance to you.   Following are the goals we discussed today:   Goals Addressed             This Visit's Progress    Obtain Supportive Resources-Managing MH Symptoms   On track    Activities and task to complete in order to accomplish goals.   Keep all upcoming appointments discussed today Continue with compliance of taking medication prescribed by Doctor. Pt's Caregiver will f/u with Oncology regarding medication requests Implement healthy coping skills discussed to assist with management of symptoms         Our next appointment is by telephone on 09/12 at 11 AM  Please call the care guide team at [phone] if you need to cancel or reschedule your appointment.   If you are experiencing a Mental Health or Behavioral Health Crisis or need someone to talk to, please call the Suicide and Crisis Lifeline: 988 call 911   The patient verbalized understanding of instructions, educational materials, and care plan provided today and DECLINED offer to receive copy of patient instructions, educational materials, and care plan.   [name], MSW, LCSW [facility] Care Management [place]  [facility] [email or web address] Phone [phone] 11:56 PM

## 2023-02-02 NOTE — Patient Outreach (Signed)
  Care Coordination   Follow Up Visit Note   02/02/2023 Name: Jocelynne Shambach [facility] MRN: [id number] DOB: 10-May-1941  Yashica Resko Capraro is a 82 y.o. year old female who sees Cecille Rubin, FNP for primary care. I spoke with  Ardith Dark Schadt by phone today.  What matters to the patients health and wellness today?  Symptom Management    Goals Addressed             This Visit's Progress    Obtain Supportive Resources-Managing MH Symptoms   On track    Activities and task to complete in order to accomplish goals.   Keep all upcoming appointments discussed today Continue with compliance of taking medication prescribed by Doctor. Pt's Caregiver will f/u with Oncology regarding medication requests Implement healthy coping skills discussed to assist with management of symptoms         SDOH assessments and interventions completed:  No     Care Coordination Interventions:  Yes, provided  Interventions Today    Flowsheet Row Most Recent Value  Chronic Disease   Chronic disease during today's visit Hypertension (HTN), Other  [Cancer, Depression]  General Interventions   General Interventions Discussed/Reviewed General Interventions Reviewed, Doctor Visits  [Pain continues to have difficulty managing pain. Will discuss concerns regarding hip pain after a transfer with medical team]  Doctor Visits Discussed/Reviewed Doctor Visits Reviewed  Mental Health Interventions   Mental Health Discussed/Reviewed Mental Health Reviewed, Coping Strategies, Depression, Anxiety  Pharmacy Interventions   Pharmacy Dicussed/Reviewed Medication Adherence, Pharmacy Topics Reviewed  Safety Interventions   Safety Discussed/Reviewed Safety Reviewed       Follow up plan: Follow up call scheduled for 1-2 weeks    Encounter Outcome:  Patient Visit Completed   [name], MSW, LCSW [facility] Care Management [facility]  [facility] [email or web address] Phone [phone] 11:55 PM

## 2023-02-03 DIAGNOSIS — M899 Disorder of bone, unspecified: Secondary | ICD-10-CM | POA: Diagnosis not present

## 2023-02-03 DIAGNOSIS — Z7401 Bed confinement status: Secondary | ICD-10-CM | POA: Diagnosis not present

## 2023-02-03 DIAGNOSIS — R102 Pelvic and perineal pain: Secondary | ICD-10-CM | POA: Diagnosis not present

## 2023-02-03 DIAGNOSIS — I10 Essential (primary) hypertension: Secondary | ICD-10-CM | POA: Diagnosis not present

## 2023-02-03 DIAGNOSIS — M898X8 Other specified disorders of bone, other site: Secondary | ICD-10-CM | POA: Diagnosis not present

## 2023-02-03 DIAGNOSIS — M25559 Pain in unspecified hip: Secondary | ICD-10-CM | POA: Diagnosis not present

## 2023-02-03 DIAGNOSIS — Z96642 Presence of left artificial hip joint: Secondary | ICD-10-CM | POA: Diagnosis not present

## 2023-02-03 DIAGNOSIS — M25552 Pain in left hip: Secondary | ICD-10-CM | POA: Diagnosis not present

## 2023-02-04 DIAGNOSIS — M150 Primary generalized (osteo)arthritis: Secondary | ICD-10-CM | POA: Diagnosis not present

## 2023-02-04 DIAGNOSIS — F32A Depression, unspecified: Secondary | ICD-10-CM | POA: Diagnosis not present

## 2023-02-04 DIAGNOSIS — N390 Urinary tract infection, site not specified: Secondary | ICD-10-CM | POA: Diagnosis not present

## 2023-02-04 DIAGNOSIS — I10 Essential (primary) hypertension: Secondary | ICD-10-CM | POA: Diagnosis not present

## 2023-02-04 DIAGNOSIS — I700 Atherosclerosis of aorta: Secondary | ICD-10-CM | POA: Diagnosis not present

## 2023-02-04 DIAGNOSIS — I890 Lymphedema, not elsewhere classified: Secondary | ICD-10-CM | POA: Diagnosis not present

## 2023-02-07 ENCOUNTER — Encounter: Payer: Self-pay | Admitting: Licensed Clinical Social Worker

## 2023-02-07 DIAGNOSIS — I700 Atherosclerosis of aorta: Secondary | ICD-10-CM | POA: Diagnosis not present

## 2023-02-07 DIAGNOSIS — Z6835 Body mass index (BMI) 35.0-35.9, adult: Secondary | ICD-10-CM | POA: Diagnosis not present

## 2023-02-07 DIAGNOSIS — K5900 Constipation, unspecified: Secondary | ICD-10-CM | POA: Diagnosis not present

## 2023-02-07 DIAGNOSIS — N189 Chronic kidney disease, unspecified: Secondary | ICD-10-CM | POA: Diagnosis not present

## 2023-02-07 DIAGNOSIS — E039 Hypothyroidism, unspecified: Secondary | ICD-10-CM | POA: Diagnosis not present

## 2023-02-07 DIAGNOSIS — F419 Anxiety disorder, unspecified: Secondary | ICD-10-CM | POA: Diagnosis not present

## 2023-02-07 DIAGNOSIS — R110 Nausea: Secondary | ICD-10-CM | POA: Diagnosis not present

## 2023-02-07 DIAGNOSIS — M150 Primary generalized (osteo)arthritis: Secondary | ICD-10-CM | POA: Diagnosis not present

## 2023-02-07 DIAGNOSIS — F339 Major depressive disorder, recurrent, unspecified: Secondary | ICD-10-CM | POA: Diagnosis not present

## 2023-02-07 DIAGNOSIS — I890 Lymphedema, not elsewhere classified: Secondary | ICD-10-CM | POA: Diagnosis not present

## 2023-02-07 DIAGNOSIS — I10 Essential (primary) hypertension: Secondary | ICD-10-CM | POA: Diagnosis not present

## 2023-02-07 DIAGNOSIS — I1310 Hypertensive heart and chronic kidney disease without heart failure, with stage 1 through stage 4 chronic kidney disease, or unspecified chronic kidney disease: Secondary | ICD-10-CM | POA: Diagnosis not present

## 2023-02-09 ENCOUNTER — Ambulatory Visit: Payer: Self-pay | Admitting: Licensed Clinical Social Worker

## 2023-02-09 ENCOUNTER — Telehealth: Payer: Self-pay | Admitting: Licensed Clinical Social Worker

## 2023-02-09 DIAGNOSIS — I10 Essential (primary) hypertension: Secondary | ICD-10-CM | POA: Diagnosis not present

## 2023-02-09 DIAGNOSIS — F339 Major depressive disorder, recurrent, unspecified: Secondary | ICD-10-CM | POA: Diagnosis not present

## 2023-02-09 DIAGNOSIS — I890 Lymphedema, not elsewhere classified: Secondary | ICD-10-CM | POA: Diagnosis not present

## 2023-02-09 DIAGNOSIS — I700 Atherosclerosis of aorta: Secondary | ICD-10-CM | POA: Diagnosis not present

## 2023-02-09 DIAGNOSIS — M150 Primary generalized (osteo)arthritis: Secondary | ICD-10-CM | POA: Diagnosis not present

## 2023-02-09 NOTE — Patient Outreach (Signed)
  Care Coordination   02/07/2023 Name: Alicia Vasquez [facility] MRN: [id number] DOB: 13-Oct-1940   Care Coordination Outreach Attempts:  An unsuccessful telephone outreach was attempted for a scheduled appointment today.  Follow Up Plan:  Additional outreach attempts will be made to offer the patient care coordination information and services.   Encounter Outcome:  No Answer   Care Coordination Interventions:  No, not indicated    [name], MSW, LCSW [facility] Care Management [place]  [facility] [email or web address] Phone [phone] 8:41 AM

## 2023-02-09 NOTE — Patient Instructions (Signed)
Visit Information  Thank you for taking time to visit with me today. Please don't hesitate to contact me if I can be of assistance to you.   Following are the goals we discussed today:   Goals Addressed             This Visit's Progress    Obtain Supportive Resources-Managing MH Symptoms   On track    Activities and task to complete in order to accomplish goals.   Keep all upcoming appointments discussed today Continue with compliance of taking medication prescribed by Doctor. Pt's Caregiver will f/u with Oncology regarding medication requests Implement healthy coping skills discussed to assist with management of symptoms  Follow up with [employer], as needed for advocacy needs        Our next appointment is by telephone on 09/26 at 2 PM  Please call the care guide team at [phone] if you need to cancel or reschedule your appointment.   If you are experiencing a Mental Health or Behavioral Health Crisis or need someone to talk to, please call the Suicide and Crisis Lifeline: 988 call 911   The patient verbalized understanding of instructions, educational materials, and care plan provided today and DECLINED offer to receive copy of patient instructions, educational materials, and care plan.   [name], MSW, LCSW [facility] Care Management [place]  [facility] [email or web address] Phone [phone] 6:51 PM

## 2023-02-09 NOTE — Patient Outreach (Signed)
  Care Coordination   Follow Up Visit Note   02/09/2023 Name: Alicia Vasquez [facility] MRN: [id number] DOB: [birth year]  Alicia Vasquez is a 82 y.o. year old female who sees Alicia Rubin, FNP for primary care. I spoke with  Alicia Vasquez's friend, Alicia Vasquez by phone today.  What matters to the patients health and wellness today?  Symptom Management    Goals Addressed             This Visit's Progress    Obtain Supportive Resources-Managing MH Symptoms   On track    Activities and task to complete in order to accomplish goals.   Keep all upcoming appointments discussed today Continue with compliance of taking medication prescribed by Doctor. Pt's Caregiver will f/u with Oncology regarding medication requests Implement healthy coping skills discussed to assist with management of symptoms  Follow up with [employer], as needed for advocacy needs        SDOH assessments and interventions completed:  No     Care Coordination Interventions:  Yes, provided  Interventions Today    Flowsheet Row Most Recent Value  Chronic Disease   Chronic disease during today's visit Hypertension (HTN), Other  [Cancer and depression]  General Interventions   General Interventions Discussed/Reviewed General Interventions Reviewed, [facility], Doctor Visits  Doctor Visits Discussed/Reviewed Doctor Visits Reviewed  [Pt has upcoming appts with oral surgery and imaging]  Mental Health Interventions   Mental Health Discussed/Reviewed Mental Health Reviewed, Coping Strategies, Anxiety, Depression  [Strategies to assist with caregiver strain discussed. Feelings validated and encouragement provided. Areas to strengthen support discussed]  Pharmacy Interventions   Pharmacy Dicussed/Reviewed Pharmacy Topics Reviewed, Medication Adherence  [Pain regimen has been discussed with staff due to hip and dental pain]  Safety Interventions   Safety Discussed/Reviewed Safety  Reviewed       Follow up plan: Follow up call scheduled for 2 weeks    Encounter Outcome:  Patient Visit Completed   [name], MSW, LCSW [facility] Care Management [facility]  [facility] [email or web address] Phone [phone] 6:51 PM

## 2023-02-10 ENCOUNTER — Inpatient Hospital Stay: Payer: Medicare Other

## 2023-02-14 DIAGNOSIS — I700 Atherosclerosis of aorta: Secondary | ICD-10-CM | POA: Diagnosis not present

## 2023-02-14 DIAGNOSIS — I890 Lymphedema, not elsewhere classified: Secondary | ICD-10-CM | POA: Diagnosis not present

## 2023-02-14 DIAGNOSIS — M150 Primary generalized (osteo)arthritis: Secondary | ICD-10-CM | POA: Diagnosis not present

## 2023-02-14 DIAGNOSIS — I10 Essential (primary) hypertension: Secondary | ICD-10-CM | POA: Diagnosis not present

## 2023-02-14 DIAGNOSIS — F339 Major depressive disorder, recurrent, unspecified: Secondary | ICD-10-CM | POA: Diagnosis not present

## 2023-02-15 ENCOUNTER — Ambulatory Visit (HOSPITAL_COMMUNITY)
Admission: RE | Admit: 2023-02-15 | Discharge: 2023-02-15 | Disposition: A | Discharge status: Home / Self Care | Payer: Medicare Other | Source: Ambulatory Visit | Attending: Hematology and Oncology | Admitting: Hematology and Oncology

## 2023-02-15 ENCOUNTER — Encounter (HOSPITAL_COMMUNITY)
Admission: RE | Admit: 2023-02-15 | Discharge: 2023-02-15 | Disposition: A | Discharge status: Home / Self Care | Payer: Medicare Other | Source: Ambulatory Visit | Attending: Hematology and Oncology | Admitting: Hematology and Oncology

## 2023-02-15 DIAGNOSIS — Z91040 Latex allergy status: Secondary | ICD-10-CM | POA: Diagnosis not present

## 2023-02-15 DIAGNOSIS — E669 Obesity, unspecified: Secondary | ICD-10-CM | POA: Diagnosis present

## 2023-02-15 DIAGNOSIS — K5900 Constipation, unspecified: Secondary | ICD-10-CM | POA: Diagnosis not present

## 2023-02-15 DIAGNOSIS — Z66 Do not resuscitate: Secondary | ICD-10-CM | POA: Diagnosis not present

## 2023-02-15 DIAGNOSIS — K859 Acute pancreatitis without necrosis or infection, unspecified: Secondary | ICD-10-CM | POA: Diagnosis not present

## 2023-02-15 DIAGNOSIS — Z853 Personal history of malignant neoplasm of breast: Secondary | ICD-10-CM | POA: Diagnosis not present

## 2023-02-15 DIAGNOSIS — Z9181 History of falling: Secondary | ICD-10-CM | POA: Diagnosis not present

## 2023-02-15 DIAGNOSIS — Z79890 Hormone replacement therapy (postmenopausal): Secondary | ICD-10-CM | POA: Diagnosis not present

## 2023-02-15 DIAGNOSIS — Z923 Personal history of irradiation: Secondary | ICD-10-CM | POA: Diagnosis not present

## 2023-02-15 DIAGNOSIS — I10 Essential (primary) hypertension: Secondary | ICD-10-CM | POA: Diagnosis not present

## 2023-02-15 DIAGNOSIS — C772 Secondary and unspecified malignant neoplasm of intra-abdominal lymph nodes: Secondary | ICD-10-CM | POA: Diagnosis not present

## 2023-02-15 DIAGNOSIS — F419 Anxiety disorder, unspecified: Secondary | ICD-10-CM | POA: Diagnosis present

## 2023-02-15 DIAGNOSIS — Z711 Person with feared health complaint in whom no diagnosis is made: Secondary | ICD-10-CM | POA: Diagnosis not present

## 2023-02-15 DIAGNOSIS — Z79899 Other long term (current) drug therapy: Secondary | ICD-10-CM | POA: Diagnosis not present

## 2023-02-15 DIAGNOSIS — C50919 Malignant neoplasm of unspecified site of unspecified female breast: Secondary | ICD-10-CM | POA: Diagnosis not present

## 2023-02-15 DIAGNOSIS — Z96642 Presence of left artificial hip joint: Secondary | ICD-10-CM | POA: Diagnosis present

## 2023-02-15 DIAGNOSIS — C50411 Malignant neoplasm of upper-outer quadrant of right female breast: Secondary | ICD-10-CM | POA: Diagnosis present

## 2023-02-15 DIAGNOSIS — C787 Secondary malignant neoplasm of liver and intrahepatic bile duct: Secondary | ICD-10-CM | POA: Diagnosis not present

## 2023-02-15 DIAGNOSIS — R590 Localized enlarged lymph nodes: Secondary | ICD-10-CM | POA: Diagnosis not present

## 2023-02-15 DIAGNOSIS — K7689 Other specified diseases of liver: Secondary | ICD-10-CM | POA: Diagnosis not present

## 2023-02-15 DIAGNOSIS — M25552 Pain in left hip: Secondary | ICD-10-CM | POA: Diagnosis not present

## 2023-02-15 DIAGNOSIS — Z9221 Personal history of antineoplastic chemotherapy: Secondary | ICD-10-CM | POA: Diagnosis not present

## 2023-02-15 DIAGNOSIS — C7951 Secondary malignant neoplasm of bone: Secondary | ICD-10-CM | POA: Insufficient documentation

## 2023-02-15 DIAGNOSIS — E559 Vitamin D deficiency, unspecified: Secondary | ICD-10-CM | POA: Diagnosis not present

## 2023-02-15 DIAGNOSIS — Z885 Allergy status to narcotic agent status: Secondary | ICD-10-CM | POA: Diagnosis not present

## 2023-02-15 DIAGNOSIS — K8500 Idiopathic acute pancreatitis without necrosis or infection: Secondary | ICD-10-CM | POA: Diagnosis not present

## 2023-02-15 DIAGNOSIS — R1084 Generalized abdominal pain: Secondary | ICD-10-CM | POA: Diagnosis not present

## 2023-02-15 DIAGNOSIS — Z8042 Family history of malignant neoplasm of prostate: Secondary | ICD-10-CM | POA: Diagnosis not present

## 2023-02-15 DIAGNOSIS — Z86718 Personal history of other venous thrombosis and embolism: Secondary | ICD-10-CM | POA: Diagnosis not present

## 2023-02-15 DIAGNOSIS — Z515 Encounter for palliative care: Secondary | ICD-10-CM | POA: Diagnosis not present

## 2023-02-15 DIAGNOSIS — K029 Dental caries, unspecified: Secondary | ICD-10-CM | POA: Diagnosis not present

## 2023-02-15 DIAGNOSIS — R112 Nausea with vomiting, unspecified: Secondary | ICD-10-CM | POA: Diagnosis not present

## 2023-02-15 DIAGNOSIS — Z8249 Family history of ischemic heart disease and other diseases of the circulatory system: Secondary | ICD-10-CM | POA: Diagnosis not present

## 2023-02-15 DIAGNOSIS — Z800 Family history of malignant neoplasm of digestive organs: Secondary | ICD-10-CM | POA: Diagnosis not present

## 2023-02-15 DIAGNOSIS — Z882 Allergy status to sulfonamides status: Secondary | ICD-10-CM | POA: Diagnosis not present

## 2023-02-15 DIAGNOSIS — Z7189 Other specified counseling: Secondary | ICD-10-CM | POA: Diagnosis not present

## 2023-02-15 DIAGNOSIS — C768 Malignant neoplasm of other specified ill-defined sites: Secondary | ICD-10-CM | POA: Diagnosis not present

## 2023-02-15 DIAGNOSIS — R1013 Epigastric pain: Secondary | ICD-10-CM | POA: Diagnosis not present

## 2023-02-15 DIAGNOSIS — K858 Other acute pancreatitis without necrosis or infection: Secondary | ICD-10-CM | POA: Diagnosis present

## 2023-02-15 DIAGNOSIS — Z880 Allergy status to penicillin: Secondary | ICD-10-CM | POA: Diagnosis not present

## 2023-02-15 DIAGNOSIS — L89151 Pressure ulcer of sacral region, stage 1: Secondary | ICD-10-CM | POA: Diagnosis present

## 2023-02-15 DIAGNOSIS — C419 Malignant neoplasm of bone and articular cartilage, unspecified: Secondary | ICD-10-CM | POA: Diagnosis not present

## 2023-02-15 DIAGNOSIS — R531 Weakness: Secondary | ICD-10-CM | POA: Diagnosis not present

## 2023-02-15 DIAGNOSIS — R52 Pain, unspecified: Secondary | ICD-10-CM | POA: Diagnosis not present

## 2023-02-15 DIAGNOSIS — R0989 Other specified symptoms and signs involving the circulatory and respiratory systems: Secondary | ICD-10-CM | POA: Diagnosis not present

## 2023-02-15 DIAGNOSIS — E039 Hypothyroidism, unspecified: Secondary | ICD-10-CM | POA: Diagnosis not present

## 2023-02-15 MED ORDER — TECHNETIUM TC 99M MEDRONATE IV KIT
20.0000 | PACK | Freq: Once | INTRAVENOUS | Status: AC | PRN
  Administered 2023-02-15: 19.6 via INTRAVENOUS

## 2023-02-15 MED ORDER — IOHEXOL 300 MG/ML  SOLN
100.0000 mL | Freq: Once | INTRAMUSCULAR | Status: AC | PRN
  Administered 2023-02-15: 100 mL via INTRAVENOUS

## 2023-02-16 ENCOUNTER — Other Ambulatory Visit: Payer: Self-pay

## 2023-02-16 ENCOUNTER — Inpatient Hospital Stay (HOSPITAL_COMMUNITY)
Admission: EM | Admit: 2023-02-16 | Discharge: 2023-02-28 | DRG: 439 | Disposition: E | Payer: Medicare Other | Attending: Internal Medicine | Admitting: Internal Medicine

## 2023-02-16 ENCOUNTER — Emergency Department (HOSPITAL_COMMUNITY): Payer: Medicare Other

## 2023-02-16 ENCOUNTER — Telehealth: Payer: Self-pay

## 2023-02-16 ENCOUNTER — Encounter (HOSPITAL_COMMUNITY): Payer: Self-pay

## 2023-02-16 DIAGNOSIS — K859 Acute pancreatitis without necrosis or infection, unspecified: Principal | ICD-10-CM

## 2023-02-16 DIAGNOSIS — Z7189 Other specified counseling: Secondary | ICD-10-CM | POA: Diagnosis not present

## 2023-02-16 DIAGNOSIS — Z8042 Family history of malignant neoplasm of prostate: Secondary | ICD-10-CM | POA: Diagnosis not present

## 2023-02-16 DIAGNOSIS — Z79899 Other long term (current) drug therapy: Secondary | ICD-10-CM | POA: Diagnosis not present

## 2023-02-16 DIAGNOSIS — R079 Chest pain, unspecified: Secondary | ICD-10-CM | POA: Diagnosis present

## 2023-02-16 DIAGNOSIS — R0989 Other specified symptoms and signs involving the circulatory and respiratory systems: Secondary | ICD-10-CM

## 2023-02-16 DIAGNOSIS — Z882 Allergy status to sulfonamides status: Secondary | ICD-10-CM

## 2023-02-16 DIAGNOSIS — R000 Tachycardia, unspecified: Secondary | ICD-10-CM | POA: Diagnosis present

## 2023-02-16 DIAGNOSIS — Z5982 Transportation insecurity: Secondary | ICD-10-CM

## 2023-02-16 DIAGNOSIS — K858 Other acute pancreatitis without necrosis or infection: Secondary | ICD-10-CM | POA: Diagnosis present

## 2023-02-16 DIAGNOSIS — I10 Essential (primary) hypertension: Secondary | ICD-10-CM | POA: Diagnosis present

## 2023-02-16 DIAGNOSIS — R590 Localized enlarged lymph nodes: Secondary | ICD-10-CM | POA: Diagnosis not present

## 2023-02-16 DIAGNOSIS — C7951 Secondary malignant neoplasm of bone: Secondary | ICD-10-CM | POA: Diagnosis present

## 2023-02-16 DIAGNOSIS — Z880 Allergy status to penicillin: Secondary | ICD-10-CM

## 2023-02-16 DIAGNOSIS — Z91040 Latex allergy status: Secondary | ICD-10-CM | POA: Diagnosis not present

## 2023-02-16 DIAGNOSIS — Z96642 Presence of left artificial hip joint: Secondary | ICD-10-CM | POA: Diagnosis present

## 2023-02-16 DIAGNOSIS — C768 Malignant neoplasm of other specified ill-defined sites: Secondary | ICD-10-CM | POA: Diagnosis not present

## 2023-02-16 DIAGNOSIS — Z711 Person with feared health complaint in whom no diagnosis is made: Secondary | ICD-10-CM

## 2023-02-16 DIAGNOSIS — Z923 Personal history of irradiation: Secondary | ICD-10-CM | POA: Diagnosis not present

## 2023-02-16 DIAGNOSIS — Z86718 Personal history of other venous thrombosis and embolism: Secondary | ICD-10-CM

## 2023-02-16 DIAGNOSIS — Z8249 Family history of ischemic heart disease and other diseases of the circulatory system: Secondary | ICD-10-CM | POA: Diagnosis not present

## 2023-02-16 DIAGNOSIS — Z515 Encounter for palliative care: Secondary | ICD-10-CM | POA: Diagnosis not present

## 2023-02-16 DIAGNOSIS — Z9221 Personal history of antineoplastic chemotherapy: Secondary | ICD-10-CM | POA: Diagnosis not present

## 2023-02-16 DIAGNOSIS — E669 Obesity, unspecified: Secondary | ICD-10-CM | POA: Diagnosis present

## 2023-02-16 DIAGNOSIS — Z800 Family history of malignant neoplasm of digestive organs: Secondary | ICD-10-CM | POA: Diagnosis not present

## 2023-02-16 DIAGNOSIS — C419 Malignant neoplasm of bone and articular cartilage, unspecified: Secondary | ICD-10-CM | POA: Diagnosis not present

## 2023-02-16 DIAGNOSIS — R5381 Other malaise: Secondary | ICD-10-CM | POA: Diagnosis present

## 2023-02-16 DIAGNOSIS — L89151 Pressure ulcer of sacral region, stage 1: Secondary | ICD-10-CM | POA: Diagnosis present

## 2023-02-16 DIAGNOSIS — C50411 Malignant neoplasm of upper-outer quadrant of right female breast: Secondary | ICD-10-CM | POA: Diagnosis present

## 2023-02-16 DIAGNOSIS — R52 Pain, unspecified: Secondary | ICD-10-CM

## 2023-02-16 DIAGNOSIS — F419 Anxiety disorder, unspecified: Secondary | ICD-10-CM | POA: Diagnosis present

## 2023-02-16 DIAGNOSIS — C787 Secondary malignant neoplasm of liver and intrahepatic bile duct: Secondary | ICD-10-CM | POA: Diagnosis present

## 2023-02-16 DIAGNOSIS — Z66 Do not resuscitate: Secondary | ICD-10-CM | POA: Diagnosis not present

## 2023-02-16 DIAGNOSIS — Z885 Allergy status to narcotic agent status: Secondary | ICD-10-CM

## 2023-02-16 DIAGNOSIS — Z853 Personal history of malignant neoplasm of breast: Secondary | ICD-10-CM | POA: Diagnosis not present

## 2023-02-16 DIAGNOSIS — Z9181 History of falling: Secondary | ICD-10-CM | POA: Diagnosis not present

## 2023-02-16 DIAGNOSIS — K029 Dental caries, unspecified: Secondary | ICD-10-CM | POA: Diagnosis not present

## 2023-02-16 DIAGNOSIS — Z79890 Hormone replacement therapy (postmenopausal): Secondary | ICD-10-CM

## 2023-02-16 DIAGNOSIS — Z5986 Financial insecurity: Secondary | ICD-10-CM

## 2023-02-16 DIAGNOSIS — K5900 Constipation, unspecified: Secondary | ICD-10-CM | POA: Diagnosis not present

## 2023-02-16 DIAGNOSIS — K7689 Other specified diseases of liver: Secondary | ICD-10-CM | POA: Diagnosis not present

## 2023-02-16 DIAGNOSIS — R112 Nausea with vomiting, unspecified: Secondary | ICD-10-CM | POA: Diagnosis not present

## 2023-02-16 DIAGNOSIS — R1084 Generalized abdominal pain: Secondary | ICD-10-CM | POA: Diagnosis not present

## 2023-02-16 DIAGNOSIS — M25552 Pain in left hip: Secondary | ICD-10-CM | POA: Diagnosis not present

## 2023-02-16 DIAGNOSIS — R1013 Epigastric pain: Secondary | ICD-10-CM

## 2023-02-16 DIAGNOSIS — C50919 Malignant neoplasm of unspecified site of unspecified female breast: Secondary | ICD-10-CM | POA: Diagnosis not present

## 2023-02-16 DIAGNOSIS — E039 Hypothyroidism, unspecified: Secondary | ICD-10-CM | POA: Diagnosis present

## 2023-02-16 DIAGNOSIS — E559 Vitamin D deficiency, unspecified: Secondary | ICD-10-CM | POA: Diagnosis not present

## 2023-02-16 DIAGNOSIS — K8500 Idiopathic acute pancreatitis without necrosis or infection: Secondary | ICD-10-CM

## 2023-02-16 DIAGNOSIS — H9190 Unspecified hearing loss, unspecified ear: Secondary | ICD-10-CM | POA: Diagnosis present

## 2023-02-16 DIAGNOSIS — L8990 Pressure ulcer of unspecified site, unspecified stage: Secondary | ICD-10-CM | POA: Insufficient documentation

## 2023-02-16 DIAGNOSIS — R531 Weakness: Secondary | ICD-10-CM | POA: Diagnosis not present

## 2023-02-16 DIAGNOSIS — Z170 Estrogen receptor positive status [ER+]: Secondary | ICD-10-CM

## 2023-02-16 DIAGNOSIS — R0683 Snoring: Secondary | ICD-10-CM | POA: Diagnosis present

## 2023-02-16 DIAGNOSIS — Z6832 Body mass index (BMI) 32.0-32.9, adult: Secondary | ICD-10-CM

## 2023-02-16 DIAGNOSIS — G893 Neoplasm related pain (acute) (chronic): Secondary | ICD-10-CM | POA: Diagnosis present

## 2023-02-16 LAB — COMPREHENSIVE METABOLIC PANEL
ALT: 17 U/L (ref 0–44)
AST: 35 U/L (ref 15–41)
Albumin: 3.1 g/dL — ABNORMAL LOW (ref 3.5–5.0)
Alkaline Phosphatase: 219 U/L — ABNORMAL HIGH (ref 38–126)
Anion gap: 12 (ref 5–15)
BUN: 17 mg/dL (ref 8–23)
CO2: 23 mmol/L (ref 22–32)
Calcium: 11 mg/dL — ABNORMAL HIGH (ref 8.9–10.3)
Chloride: 102 mmol/L (ref 98–111)
Creatinine, Ser: 0.76 mg/dL (ref 0.44–1.00)
GFR, Estimated: >60 mL/min (ref >60)
Glucose, Bld: 120 mg/dL — ABNORMAL HIGH (ref 70–99)
Potassium: 4.1 mmol/L (ref 3.5–5.1)
Sodium: 137 mmol/L (ref 135–145)
Total Bilirubin: 1 mg/dL (ref 0.3–1.2)
Total Protein: 7.3 g/dL (ref 6.5–8.1)

## 2023-02-16 LAB — CBC
HCT: 39.9 % (ref 36.0–46.0)
Hemoglobin: 12.6 g/dL (ref 12.0–15.0)
MCH: 28.1 pg (ref 26.0–34.0)
MCHC: 31.6 g/dL (ref 30.0–36.0)
MCV: 89.1 fL (ref 80.0–100.0)
Platelets: 384 10*3/uL (ref 150–400)
RBC: 4.48 MIL/uL (ref 3.87–5.11)
RDW: 16.6 % — ABNORMAL HIGH (ref 11.5–15.5)
WBC: 17.7 10*3/uL — ABNORMAL HIGH (ref 4.0–10.5)
nRBC: 0 % (ref 0.0–0.2)

## 2023-02-16 LAB — TRIGLYCERIDES: Triglycerides: 175 mg/dL — ABNORMAL HIGH (ref <150)

## 2023-02-16 LAB — TROPONIN I (HIGH SENSITIVITY): Troponin I (High Sensitivity): 9 ng/L (ref <18)

## 2023-02-16 MED ORDER — LACTATED RINGERS IV BOLUS
500.0000 mL | Freq: Once | INTRAVENOUS | Status: AC
  Administered 2023-02-16: 500 mL via INTRAVENOUS

## 2023-02-16 MED ORDER — ACETAMINOPHEN 325 MG PO TABS
650.0000 mg | ORAL_TABLET | Freq: Four times a day (QID) | ORAL | Status: DC | PRN
  Administered 2023-02-17: 650 mg via ORAL
  Filled 2023-02-16: qty 2

## 2023-02-16 MED ORDER — ALBUTEROL SULFATE (2.5 MG/3ML) 0.083% IN NEBU: 2.5000 mg | INHALATION_SOLUTION | RESPIRATORY_TRACT | Status: DC | PRN

## 2023-02-16 MED ORDER — ENOXAPARIN SODIUM 40 MG/0.4ML IJ SOSY
40.0000 mg | PREFILLED_SYRINGE | INTRAMUSCULAR | Status: DC
  Administered 2023-02-17 – 2023-02-18 (×2): 40 mg via SUBCUTANEOUS
  Filled 2023-02-16 (×3): qty 0.4

## 2023-02-16 MED ORDER — FENTANYL CITRATE PF 50 MCG/ML IJ SOSY
50.0000 ug | PREFILLED_SYRINGE | Freq: Once | INTRAMUSCULAR | Status: AC
  Administered 2023-02-16: 50 ug via INTRAVENOUS
  Filled 2023-02-16: qty 1

## 2023-02-16 MED ORDER — ONDANSETRON HCL 4 MG/2ML IJ SOLN
4.0000 mg | Freq: Four times a day (QID) | INTRAMUSCULAR | Status: DC | PRN
  Administered 2023-02-16 – 2023-02-19 (×6): 4 mg via INTRAVENOUS
  Filled 2023-02-16 (×6): qty 2

## 2023-02-16 MED ORDER — LACTATED RINGERS IV SOLN
INTRAVENOUS | Status: DC
  Administered 2023-02-16 – 2023-02-17 (×2): 125 mL/h via INTRAVENOUS
  Administered 2023-02-20: 1000 mL via INTRAVENOUS

## 2023-02-16 MED ORDER — LEVOTHYROXINE SODIUM 112 MCG PO TABS
112.0000 ug | ORAL_TABLET | Freq: Every day | ORAL | Status: DC
  Administered 2023-02-17 – 2023-02-20 (×4): 112 ug via ORAL
  Filled 2023-02-16 (×4): qty 1

## 2023-02-16 MED ORDER — PANTOPRAZOLE SODIUM 40 MG IV SOLR
40.0000 mg | INTRAVENOUS | Status: DC
  Administered 2023-02-16 – 2023-02-20 (×5): 40 mg via INTRAVENOUS
  Filled 2023-02-16 (×5): qty 10

## 2023-02-16 MED ORDER — ONDANSETRON HCL 4 MG PO TABS: 4.0000 mg | ORAL_TABLET | Freq: Four times a day (QID) | ORAL | Status: DC | PRN

## 2023-02-16 MED ORDER — IOHEXOL 350 MG/ML SOLN
100.0000 mL | Freq: Once | INTRAVENOUS | Status: AC | PRN
  Administered 2023-02-16: 100 mL via INTRAVENOUS

## 2023-02-16 MED ORDER — ACETAMINOPHEN 650 MG RE SUPP: 650.0000 mg | Freq: Four times a day (QID) | RECTAL | Status: DC | PRN

## 2023-02-16 MED ORDER — HYDROMORPHONE HCL 1 MG/ML IJ SOLN
0.5000 mg | INTRAMUSCULAR | Status: DC | PRN
  Administered 2023-02-16 – 2023-02-20 (×21): 1 mg via INTRAVENOUS
  Filled 2023-02-16 (×21): qty 1

## 2023-02-16 MED ORDER — LISINOPRIL 10 MG PO TABS
10.0000 mg | ORAL_TABLET | Freq: Every day | ORAL | Status: DC
  Administered 2023-02-17 – 2023-02-20 (×4): 10 mg via ORAL
  Filled 2023-02-16 (×4): qty 1

## 2023-02-16 MED ORDER — TRAZODONE HCL 50 MG PO TABS
25.0000 mg | ORAL_TABLET | Freq: Every evening | ORAL | Status: DC | PRN
  Administered 2023-02-17: 25 mg via ORAL
  Filled 2023-02-16: qty 1

## 2023-02-16 MED ORDER — HYDRALAZINE HCL 20 MG/ML IJ SOLN: 5.0000 mg | Freq: Four times a day (QID) | INTRAMUSCULAR | Status: DC | PRN

## 2023-02-16 NOTE — ED Triage Notes (Signed)
BIBA from [facility] for HTN, chest pain, n/v since bone scan done yesterday. 208/116 BP

## 2023-02-16 NOTE — H&P (Signed)
History and Physical  Alicia Vasquez YQM:[id number] DOB: Jan 09, 1941 DOA: 03/10/23  PCP: Cecille Rubin, FNP   Chief Complaint: Chest pain  HPI: Alicia Vasquez is a 82 y.o. 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  History provided by the patient as well as her close friend and healthcare power of attorney who is at the bedside with her.  She lives in the facility, for the last 3 to 4 days, she has been having upper abdominal and chest pain, with some pain radiating to her back.  There has also been some associated nausea and vomiting.  No fevers, no hematemesis.  Patient denies history of gallstones, or gallbladder disease.  ED Course: On evaluation in the emergency department, she is hypertensive and borderline tachycardic, she is afebrile and saturating well on room air.  Lab work shows WBC 18, normal renal function and electrolytes, lipase is 615, alk phos 219, normal AST and ALT.  Normal total bilirubin.  CT scan was performed in the emergency department, ER provider discussed preliminary reviewed with radiology which is consistent with acute pancreatitis and possible area of necrosis.  She was given pain and nausea medications, and placed on IV fluids.  Hospitalist was contacted for admission.  Review of Systems: Please see HPI for pertinent positives and negatives. A complete 10 system review of systems are otherwise negative.  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    Hypertension    Hypothyroid    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   Personal history of radiation therapy    Pneumonia    PONV (postoperative nausea and vomiting)     Radiation 11/21/13-01/07/14   Right Breast/Supraclavicular   Past Surgical History:  Procedure Laterality Date   BREAST LUMPECTOMY Left 2015   BREAST LUMPECTOMY WITH RADIOACTIVE SEED LOCALIZATION Right 09/09/2013   Procedure: BREAST LUMPECTOMY WITH RADIOACTIVE SEED LOCALIZATION WITH AXILLARY NODE EXCISION;  Surgeon: Emelia Loron, MD;  Location: [facility];  Service: General;  Laterality: Right;   CYSTOSCOPY W/ URETERAL STENT PLACEMENT Right 01/07/2021   Procedure: CYSTOSCOPY WITH RETROGRADE PYELOGRAM/URETERAL STENT PLACEMENT;  Surgeon: Alfredo Martinez, MD;  Location: [facility];  Service: Urology;  Laterality: Right;   CYSTOSCOPY/URETEROSCOPY/HOLMIUM LASER/STENT PLACEMENT Right 02/08/2021   Procedure: CYSTOSCOPY, RIGHT URETEROSCOPY, RIGHT RETRGRADE PYELOGRAM, HOLMIUM LASER/STENT PLACEMENT;  Surgeon: Crista Elliot, MD;  Location: [facility];  Service: Urology;  Laterality: Right;   DENTAL SURGERY  04/19/2012   13 TEETH REMOVED   DILATION AND CURETTAGE OF UTERUS     IR RADIOLOGIST EVAL & MGMT  01/21/2021   ORIF PERIPROSTHETIC FRACTURE Left 01/31/2017   Procedure: REVISION and OPEN REDUCTION INTERNAL FIXATION (ORIF) PERIPROSTHETIC FRACTURE LEFT HIP;  Surgeon: Durene Romans, MD;  Location: [facility];  Service: Orthopedics;  Laterality: Left;  120 mins   RE-EXCISION OF BREAST LUMPECTOMY Right 09/24/2013   Procedure: RE-EXCISION OF RIGHT BREAST LUMPECTOMY;  Surgeon: Emelia Loron, MD;  Location: [facility];  Service: General;  Laterality: Right;   TOTAL HIP ARTHROPLASTY Left 01/16/2017   Procedure: TOTAL HIP ARTHROPLASTY POSTERIOR;  Surgeon: Durene Romans, MD;  Location: [facility];  Service: Orthopedics;  Laterality: Left;    Social History:  reports that she has never smoked. She has never used smokeless tobacco. She reports that she does not drink alcohol and does not use drugs.   Allergies  Allergen Reactions   Anesthetics, Amide Hypertension   Benadryl  [Diphenhydramine Hcl] Other (See Comments)    Dizziness   Carbocaine [Mepivacaine Hcl] Hypertension   Codeine Other (See Comments)    Dizziness   Epinephrine Hypertension   Sulfa Antibiotics Other (See Comments)    dizziness   Diphenhydramine    Latex Other (See Comments)    Blisters in mouth   Macrobid [Nitrofurantoin] Nausea And Vomiting   Penicillin G    Penicillins Rash    Has patient had a PCN reaction causing immediate rash, facial/tongue/throat swelling, SOB or lightheadedness with hypotension: Unknown Has patient had a PCN reaction causing severe rash involving mucus membranes or skin necrosis: Unknown Has patient had a PCN reaction that required hospitalization: Unknown Has patient had a PCN reaction occurring within the last 10 years: Unknown If all of the above answers are "NO", then may proceed with Cephalosporin use.    Tramadol Other (See Comments)    Sedation.     Family History  Problem Relation Age of Onset   Heart disease Brother    Colon cancer Brother    Prostate cancer Brother      Prior to Admission medications   Medication Sig Start Date End Date Taking? Authorizing Provider  clindamycin (CLEOCIN) 150 MG capsule Take by mouth. 01/31/23  Yes [provider]  cholecalciferol (VITAMIN D3) 25 MCG (1000 UNIT) tablet Take 1,000 Units by mouth daily. Current dose 5000 units    [provider]  levothyroxine (SYNTHROID) 112 MCG tablet Take 1 tablet (112 mcg total) by mouth daily before breakfast. 10/25/22   Carlus Pavlov, MD  lisinopril (ZESTRIL) 10 MG tablet TAKE ONE TABLET BY MOUTH ONCE DAILY 12/26/22   Ardith Dark, MD  naloxone [facility]) 0.4 MG/ML injection Inject 1 mL (0.4 mg total) into the muscle as needed. 03/17/22   Shalhoub, Deno Lunger, MD  ondansetron (ZOFRAN) 8 MG tablet TAKE ONE TABLET BY MOUTH EVERY 8 HOURS AS NEEDED FOR NAUSEA AND vomiting 01/04/23   Pickenpack-Cousar, Arty Baumgartner, NP  oxyCODONE (OXY IR/ROXICODONE) 5 MG immediate  release tablet Take 1 tablet (5 mg total) by mouth every 6 (six) hours as needed for severe pain. 01/04/23   Pickenpack-Cousar, Arty Baumgartner, NP  pantoprazole (PROTONIX) 40 MG tablet Take 1 tablet (40 mg total) by mouth daily before breakfast. 11/28/22   Pickenpack-Cousar, Arty Baumgartner, NP    Physical Exam: BP (!) 173/84   Pulse 86   Temp 98 F (36.7 C)   Resp 19   SpO2 92%   General:  Alert, oriented, calm, in no acute distress, hard of hearing, health department.  The bedside Eyes: EOMI, clear conjuctivae, white sclerea Neck: supple, no masses, trachea mildline  Cardiovascular: RRR, no murmurs or rubs, no peripheral edema  Respiratory: clear to auscultation bilaterally, no wheezes, no crackles  Abdomen: soft, quite tender to palpation diffusely, nondistended, normal bowel tones heard  Skin: dry, no rashes  Musculoskeletal: no joint effusions, normal range of motion  Psychiatric: appropriate affect, normal speech  Neurologic: extraocular muscles intact, clear speech, moving all extremities with intact sensorium         Labs on Admission:  Basic Metabolic Panel: Recent Labs  Lab 03-16-23 1840  NA 137  K 4.1  CL 102  CO2 23  GLUCOSE 120*  BUN 17  CREATININE 0.76  CALCIUM 11.0*   Liver Function Tests: Recent Labs  Lab 02/20/2023 1840  AST 35  ALT 17  ALKPHOS 219*  BILITOT 1.0  PROT 7.3  ALBUMIN 3.1*   Recent Labs  Lab 02/20/2023 1840  LIPASE 615*   No results for input(s): "AMMONIA" in the last 168 hours. CBC: Recent Labs  Lab 02/20/2023 1840  WBC 17.7*  HGB 12.6  HCT 39.9  MCV 89.1  PLT 384   Cardiac Enzymes: No results for input(s): "CKTOTAL", "CKMB", "CKMBINDEX", "TROPONINI" in the last 168 hours.  BNP (last 3 results) No results for input(s): "BNP" in the last 8760 hours.  ProBNP (last 3 results) No results for input(s): "PROBNP" in the last 8760 hours.  CBG: No results for input(s): "GLUCAP" in the last 168 hours.  Radiological Exams on Admission: No  results found.  Assessment/Plan Alicia Vasquez is a 82 y.o. 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    Acute pancreatitis-unclear etiology at this time, could be related to gallstone disease. -Inpatient admission -N.p.o. except for ice chips and sips with meds -IV lactated Ringer's infusion -Pain and nausea medication as needed -Check triglycerides -Follow-up formal CT read -Patient may benefit from abdominal ultrasound to evaluate for biliary and pancreatic ductal dilation, pending patient CT evaluation  Hypertension -blood pressure currently uncontrolled likely due to acute pain -Continue home lisinopril -IV hydralazine for uncontrolled hypertension  Hypothryoidism-continue Synthroid  Breast cancer-on monthly fulvestrant injections  DVT prophylaxis: Lovenox     Code Status: Full Code  Consults called: None  Admission status: Inpatient   Time spent: 49 minutes  Tisheena Maguire Sharlette Dense MD [facility] Pager [phone]  If 7PM-7AM, please contact night-coverage [email or web address] Password [facility]  02/20/2023, 10:03 PM

## 2023-02-16 NOTE — ED Notes (Signed)
ED TO INPATIENT HANDOFF REPORT  ED Nurse Name and Phone #: Jacqulyn Liner EMTP [phone]  S Name/Age/Gender Alicia Vasquez 82 y.o. female Room/Bed: WA19/WA19  Code Status   Code Status: Full Code  Home/SNF/Other Nursing Home Patient oriented to: self, place, time, and situation Is this baseline? Yes   Triage Complete: Triage complete  Chief Complaint Acute pancreatitis [K85.90]  Triage Note BIBA from [facility] for HTN, chest pain, n/v since bone scan done yesterday. 208/116 BP   Allergies Allergies  Allergen Reactions   Anesthetics, Amide Hypertension   Benadryl [Diphenhydramine Hcl] Other (See Comments)    Dizziness   Carbocaine [Mepivacaine Hcl] Hypertension   Codeine Other (See Comments)    Dizziness   Epinephrine Hypertension   Sulfa Antibiotics Other (See Comments)    dizziness   Diphenhydramine    Latex Other (See Comments)    Blisters in mouth   Macrobid [Nitrofurantoin] Nausea And Vomiting   Penicillin G    Penicillins Rash    Has patient had a PCN reaction causing immediate rash, facial/tongue/throat swelling, SOB or lightheadedness with hypotension: Unknown Has patient had a PCN reaction causing severe rash involving mucus membranes or skin necrosis: Unknown Has patient had a PCN reaction that required hospitalization: Unknown Has patient had a PCN reaction occurring within the last 10 years: Unknown If all of the above answers are "NO", then may proceed with Cephalosporin use.    Tramadol Other (See Comments)    Sedation.     Level of Care/Admitting Diagnosis ED Disposition     ED Disposition  Admit   Condition  --   Comment  Hospital Area: [facility] [[id number]]  Level of Care: Med-Surg [16]  May admit patient to [facility] or [facility] if equivalent level of care is available:: Yes  Covid Evaluation: Asymptomatic - no recent exposure (last 10 days) testing not required  Diagnosis: Acute pancreatitis [577.0.ICD-9-CM]   Admitting Physician: Maryln Gottron [[id number]]  Attending Physician: Kirby Crigler, MIR [email or web address] [[id number]]  Certification:: I certify this patient will need inpatient services for at least 2 midnights  Expected Medical Readiness: 02/19/2023          B Medical/Surgery History 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    Hypertension    Hypothyroid    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   Personal history of radiation therapy    Pneumonia    PONV (postoperative nausea and vomiting)    Radiation 11/21/13-01/07/14   Right Breast/Supraclavicular   Past Surgical History:  Procedure Laterality Date   BREAST LUMPECTOMY Left 2015   BREAST LUMPECTOMY WITH RADIOACTIVE SEED LOCALIZATION Right 09/09/2013   Procedure: BREAST LUMPECTOMY WITH RADIOACTIVE SEED LOCALIZATION WITH AXILLARY NODE EXCISION;  Surgeon: Emelia Loron, MD;  Location: [facility];  Service: General;  Laterality: Right;   CYSTOSCOPY W/ URETERAL STENT PLACEMENT Right 01/07/2021   Procedure: CYSTOSCOPY WITH RETROGRADE PYELOGRAM/URETERAL STENT PLACEMENT;  Surgeon: Alfredo Martinez, MD;  Location: [facility];  Service: Urology;  Laterality: Right;   CYSTOSCOPY/URETEROSCOPY/HOLMIUM LASER/STENT PLACEMENT Right 02/08/2021   Procedure: CYSTOSCOPY, RIGHT URETEROSCOPY, RIGHT RETRGRADE PYELOGRAM, HOLMIUM LASER/STENT PLACEMENT;  Surgeon: Crista Elliot, MD;  Location: [facility];  Service: Urology;  Laterality: Right;   DENTAL SURGERY  04/19/2012   13 TEETH REMOVED   DILATION AND CURETTAGE OF UTERUS     IR RADIOLOGIST EVAL & MGMT  01/21/2021   ORIF PERIPROSTHETIC FRACTURE Left 01/31/2017   Procedure: REVISION and OPEN REDUCTION INTERNAL FIXATION (ORIF) PERIPROSTHETIC FRACTURE LEFT HIP;  Surgeon: Durene Romans, MD;  Location: [facility];   Service: Orthopedics;  Laterality: Left;  120 mins   RE-EXCISION OF BREAST LUMPECTOMY Right 09/24/2013   Procedure: RE-EXCISION OF RIGHT BREAST LUMPECTOMY;  Surgeon: Emelia Loron, MD;  Location: [facility];  Service: General;  Laterality: Right;   TOTAL HIP ARTHROPLASTY Left 01/16/2017   Procedure: TOTAL HIP ARTHROPLASTY POSTERIOR;  Surgeon: Durene Romans, MD;  Location: [facility];  Service: Orthopedics;  Laterality: Left;     A IV Location/Drains/Wounds Patient Lines/Drains/Airways Status     Active Line/Drains/Airways     Name Placement date Placement time Site Days   Peripheral IV 02-18-2023 20 G Anterior;Left Forearm February 18, 2023  1844  Forearm  less than 1            Intake/Output Last 24 hours No intake or output data in the 24 hours ending 02-18-2023 2231  Labs/Imaging Results for orders placed or performed during the hospital encounter of Feb 18, 2023 (from the past 48 hour(s))  Lipase, blood     Status: Abnormal   Collection Time: 18-Feb-2023  6:40 PM  Result Value Ref Range   Lipase 615 (H) 11 - 51 U/L    Comment: Performed at [facility], [address] [place], [place] [address]  Comprehensive metabolic panel     Status: Abnormal   Collection Time: 2023-02-18  6:40 PM  Result Value Ref Range   Sodium 137 135 - 145 mmol/L   Potassium 4.1 3.5 - 5.1 mmol/L   Chloride 102 98 - 111 mmol/L   CO2 23 22 - 32 mmol/L   Glucose, Bld 120 (H) 70 - 99 mg/dL    Comment: Glucose reference range applies only to samples taken after fasting for at least 8 hours.   BUN 17 8 - 23 mg/dL   Creatinine, Ser 4.40 0.44 - 1.00 mg/dL   Calcium 10.2 (H) 8.9 - 10.3 mg/dL   Total Protein 7.3 6.5 - 8.1 g/dL   Albumin 3.1 (L) 3.5 - 5.0 g/dL   AST 35 15 - 41 U/L   ALT 17 0 - 44 U/L   Alkaline Phosphatase 219 (H) 38 - 126 U/L   Total Bilirubin 1.0 0.3 - 1.2 mg/dL   GFR, Estimated >72 >53 mL/min    Comment: (NOTE) Calculated using the CKD-EPI Creatinine Equation (2021)     Anion gap 12 5 - 15    Comment: Performed at [facility], [address] [place], [place] [address]  CBC     Status: Abnormal   Collection Time: 02-18-23  6:40 PM  Result Value Ref Range   WBC 17.7 (H) 4.0 - 10.5 K/uL   RBC 4.48 3.87 - 5.11 MIL/uL   Hemoglobin 12.6 12.0 - 15.0 g/dL   HCT 34.7 42.5 - 95.6 %   MCV 89.1 80.0 - 100.0 fL   MCH 28.1 26.0 - 34.0 pg   MCHC 31.6 30.0 - 36.0 g/dL   RDW 38.7 (H) 56.4 - 33.2 %   Platelets 384 150 - 400 K/uL   nRBC 0.0 0.0 - 0.2 %    Comment: Performed at [facility], [address] [place], [place] [address]  Troponin I (High Sensitivity)  Status: None   Collection Time: 01/30/2023  6:40 PM  Result Value Ref Range   Troponin I (High Sensitivity) 9 <18 ng/L    Comment: (NOTE) Elevated high sensitivity troponin I (hsTnI) values and significant  changes across serial measurements may suggest ACS but many other  chronic and acute conditions are known to elevate hsTnI results.  Refer to the "Links" section for chest pain algorithms and additional  guidance. Performed at [facility], [address] [place], [place] [address]    *Note: Due to a large number of results and/or encounters for the requested time period, some results have not been displayed. A complete set of results can be found in Results Review.   No results found.  Pending Labs Unresulted Labs (From admission, onward)     Start     Ordered   02/17/23 0500  Comprehensive metabolic panel  Tomorrow morning,   R        02/15/2023 2203   02/17/23 0500  CBC  Tomorrow morning,   R        Feb 21, 2023 2203   02/17/23 0500  Lipase, blood  Tomorrow morning,   R        Feb 21, 2023 2203   February 21, 2023 2203  Triglycerides  Add-on,   AD        21-Feb-2023 2202   21-Feb-2023 1827  Urinalysis, Routine w reflex microscopic -Urine, Clean Catch  Once,   URGENT       Question:  Specimen Source  Answer:  Urine, Clean Catch   02/19/2023 1826             Vitals/Pain Today's Vitals   02/13/2023 1930 02/20/2023 2143 02/12/2023 2200 02/27/2023 2215  BP: (!) 173/84  (!) 184/76 (!) 187/76  Pulse: 86  (!) 102 100  Resp:      Temp:      SpO2: 92%  92% 91%  PainSc:  6       Isolation Precautions No active isolations  Medications Medications  lactated ringers infusion ( Intravenous New Bag/Given 02/26/2023 1939)  enoxaparin (LOVENOX) injection 40 mg (has no administration in time range)  acetaminophen (TYLENOL) tablet 650 mg (has no administration in time range)    Or  acetaminophen (TYLENOL) suppository 650 mg (has no administration in time range)  traZODone (DESYREL) tablet 25 mg (has no administration in time range)  HYDROmorphone (DILAUDID) injection 0.5-1 mg (has no administration in time range)  ondansetron (ZOFRAN) tablet 4 mg (has no administration in time range)    Or  ondansetron (ZOFRAN) injection 4 mg (has no administration in time range)  albuterol (PROVENTIL) (2.5 MG/3ML) 0.083% nebulizer solution 2.5 mg (has no administration in time range)  hydrALAZINE (APRESOLINE) injection 5 mg (has no administration in time range)  lisinopril (ZESTRIL) tablet 10 mg (has no administration in time range)  levothyroxine (SYNTHROID) tablet 112 mcg (has no administration in time range)  pantoprazole (PROTONIX) injection 40 mg (has no administration in time range)  lactated ringers bolus 500 mL (0 mLs Intravenous Stopped 21-Feb-2023 1940)  fentaNYL (SUBLIMAZE) injection 50 mcg (50 mcg Intravenous Given 02/08/2023 1939)  iohexol (OMNIPAQUE) 350 MG/ML injection 100 mL (100 mLs Intravenous Contrast Given 21-Feb-2023 2054)  fentaNYL (SUBLIMAZE) injection 50 mcg (50 mcg Intravenous Given 02/14/2023 2221)    Mobility non-ambulatory     Focused Assessments Patient came in from [facility] for chest pain and N/V since bone scan. Patient looks to daughter when asked questions and repeats sometimes. She is aware  of what your asking but looks for confirmation.     R Recommendations: See Admitting Provider Note  Report given to: Jacqulyn Cane   Additional Notes:

## 2023-02-16 NOTE — Telephone Encounter (Signed)
Pt called this RN stating that she has been vomiting and having chest pain, and cannot reach her call bell. This RN asked pt if she was able to get nausea medication or get in touch with her nurse. She stated that her call bell in on the floor and cannot reach it. This RN asked if she was able to call out to get help and she stated that she is not able to get anyone in her room. RN called the nursing home facility, [facility], and got in touch with a nurse at the nurse's station. Tech [name] let Nurse Corrie Dandy at [facility] know and states that she is currently being attended to.

## 2023-02-16 NOTE — ED Provider Notes (Addendum)
[facility] AT [facility] Provider Note   CSN: [id number] Arrival date & time: 02/06/2023  1817     History  Chief Complaint  Patient presents with   Hypertension   Abdominal Pain    Alicia Vasquez is a 82 y.o. female here from [facility] assisted [facility] facility with chest pain and hypertension.  She is a poor historian, but able to tell me that she she has been having pain and vomiting anytime she tries eat or drink anything today.  She had soup for dinner last night. Reports abdominal pain, but cannot specify where.  Also having back pain. She says she is having dental pain and that she will be having extraction soon with an oral Careers adviser.  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  Hypertension Associated symptoms include abdominal pain. Pertinent negatives include no shortness of breath.  Abdominal Pain Associated symptoms: nausea and vomiting   Associated symptoms: no chills, no constipation, no cough, no diarrhea, no fever and no shortness of breath        Home Medications Prior to Admission medications   Medication Sig Start Date End Date Taking? Authorizing Provider  cholecalciferol (VITAMIN D3) 25 MCG (1000 UNIT) tablet Take 1,000 Units by mouth daily. Current dose 5000 units    [provider]  Elastic Bandages & Supports (V-5 HIGH COMPRESSION HOSE) MISC Use daily as needed for leg edema 11/24/22   Ardith Dark, MD  ketoconazole (NIZORAL) 2 % cream Apply 1 Application topically 2 (two) times daily. 11/24/22   Ardith Dark, MD  levothyroxine (SYNTHROID) 112 MCG tablet Take 1 tablet (112 mcg total) by mouth daily before breakfast. 10/25/22   Carlus Pavlov, MD  lisinopril (ZESTRIL) 10 MG tablet TAKE ONE TABLET BY MOUTH ONCE DAILY 12/26/22   Ardith Dark, MD  naloxone [facility]) 0.4 MG/ML injection Inject  1 mL (0.4 mg total) into the muscle as needed. 03/17/22   Shalhoub, Deno Lunger, MD  nystatin (MYCOSTATIN/NYSTOP) powder Apply 1 Application topically 3 (three) times daily. 01/20/23   Rachel Moulds, MD  ondansetron (ZOFRAN) 8 MG tablet TAKE ONE TABLET BY MOUTH EVERY 8 HOURS AS NEEDED FOR NAUSEA AND vomiting 01/04/23   Pickenpack-Cousar, Arty Baumgartner, NP  oxyCODONE (OXY IR/ROXICODONE) 5 MG immediate release tablet Take 1 tablet (5 mg total) by mouth every 6 (six) hours as needed for severe pain. 01/04/23   Pickenpack-Cousar, Arty Baumgartner, NP  pantoprazole (PROTONIX) 40 MG tablet Take 1 tablet (40 mg total) by mouth daily before breakfast. 11/28/22   Pickenpack-Cousar, Arty Baumgartner, NP      Allergies    Anesthetics, amide; Benadryl [diphenhydramine hcl]; Carbocaine [mepivacaine hcl]; Codeine; Epinephrine; Sulfa antibiotics; Diphenhydramine; Latex; Macrobid [nitrofurantoin]; Penicillin g; Penicillins; and Tramadol    Review of Systems   Review of Systems  Constitutional:  Negative for chills and fever.  Respiratory:  Negative for cough and shortness of breath.   Gastrointestinal:  Positive for abdominal pain, nausea and vomiting. Negative for constipation and diarrhea.    Physical Exam Updated Vital Signs BP (!) 173/84   Pulse 86   Temp 98 F (36.7 C)   Resp 19   SpO2 92%  Physical Exam Constitutional:      Appearance: She is obese. She is ill-appearing.     Comments: Elderly  HENT:     Head:     Comments: Poor  dentition, Wears corrective lenses    Mouth/Throat:     Pharynx: Oropharynx is clear.     Comments: Dry MM Cardiovascular:     Rate and Rhythm: Normal rate and regular rhythm.  Pulmonary:     Effort: Pulmonary effort is normal.     Breath sounds: Normal breath sounds.  Abdominal:     General: Bowel sounds are normal.     Palpations: Abdomen is soft.     Tenderness: There is abdominal tenderness (Exsquisitely tender to palpation in upper quadrants and epigastrium) in the epigastric area.  There is guarding.  Skin:    General: Skin is warm.  Neurological:     Mental Status: She is alert.     ED Results / Procedures / Treatments   Labs (all labs ordered are listed, but only abnormal results are displayed) Labs Reviewed  LIPASE, BLOOD - Abnormal; Notable for the following components:      Result Value   Lipase 615 (*)    All other components within normal limits  COMPREHENSIVE METABOLIC PANEL - Abnormal; Notable for the following components:   Glucose, Bld 120 (*)    Calcium 11.0 (*)    Albumin 3.1 (*)    Alkaline Phosphatase 219 (*)    All other components within normal limits  CBC - Abnormal; Notable for the following components:   WBC 17.7 (*)    RDW 16.6 (*)    All other components within normal limits  URINALYSIS, ROUTINE W REFLEX MICROSCOPIC  TROPONIN I (HIGH SENSITIVITY)  TROPONIN I (HIGH SENSITIVITY)    EKG None  Radiology No results found.  Procedures Procedures    Medications Ordered in ED Medications  lactated ringers infusion ( Intravenous New Bag/Given Mar 05, 2023 1939)  fentaNYL (SUBLIMAZE) injection 50 mcg (has no administration in time range)  lactated ringers bolus 500 mL (0 mLs Intravenous Stopped March 05, 2023 1940)  fentaNYL (SUBLIMAZE) injection 50 mcg (50 mcg Intravenous Given 03/05/2023 1939)  iohexol (OMNIPAQUE) 350 MG/ML injection 100 mL (100 mLs Intravenous Contrast Given 03/05/2023 2054)    ED Course/ Medical Decision Making/ A&P                                 Medical Decision Making 82 year old female with metastatic breast cancer who presented with abdominal pain, vomiting and decreased p.o. intake secondary to acute pancreatitis.  Lipase elevated 651, with leukocytosis 17,000 and preliminary read of CT abdomen/pelvis by radiologist with acute pancreatitis. Treated with IV fentanyl 50 mcg, x 2, IV fluid resuscitation. Significantly elevated blood pressure likely secondary to pain.  Improved from 200/110s to 170s/80s after pain  management. Reassuringly, no electrolyte derangements, troponins within normal limits. Requires admission for pain control, IV fluid resuscitation and further monitoring of acute pancreatitis.  I discussed plan of care with patient and her healthcare power of attorney, Rica Koyanagi who is at bedside.  Discussed with hospitalist for admission  Amount and/or Complexity of Data Reviewed Labs: ordered. Radiology: ordered.  Risk Prescription drug management. Decision regarding hospitalization.     Final Clinical Impression(s) / ED Diagnoses Final diagnoses:  Acute pancreatitis, unspecified complication status, unspecified pancreatitis type    Rx / DC Orders ED Discharge Orders     None         Darral Dash, DO 05-Mar-2023 2143    Darral Dash, DO 03/05/23 2144    Tegeler, Canary Brim, MD 02/18/23 2357

## 2023-02-17 ENCOUNTER — Telehealth: Payer: Self-pay | Admitting: Licensed Clinical Social Worker

## 2023-02-17 DIAGNOSIS — C787 Secondary malignant neoplasm of liver and intrahepatic bile duct: Secondary | ICD-10-CM

## 2023-02-17 DIAGNOSIS — C50919 Malignant neoplasm of unspecified site of unspecified female breast: Secondary | ICD-10-CM | POA: Diagnosis not present

## 2023-02-17 DIAGNOSIS — I10 Essential (primary) hypertension: Secondary | ICD-10-CM | POA: Diagnosis not present

## 2023-02-17 DIAGNOSIS — L8990 Pressure ulcer of unspecified site, unspecified stage: Secondary | ICD-10-CM | POA: Insufficient documentation

## 2023-02-17 DIAGNOSIS — K859 Acute pancreatitis without necrosis or infection, unspecified: Secondary | ICD-10-CM | POA: Diagnosis not present

## 2023-02-17 LAB — COMPREHENSIVE METABOLIC PANEL
ALT: 17 U/L (ref 0–44)
AST: 32 U/L (ref 15–41)
Albumin: 2.8 g/dL — ABNORMAL LOW (ref 3.5–5.0)
Alkaline Phosphatase: 201 U/L — ABNORMAL HIGH (ref 38–126)
Anion gap: 9 (ref 5–15)
BUN: 16 mg/dL (ref 8–23)
CO2: 23 mmol/L (ref 22–32)
Calcium: 10.4 mg/dL — ABNORMAL HIGH (ref 8.9–10.3)
Chloride: 103 mmol/L (ref 98–111)
Creatinine, Ser: 0.83 mg/dL (ref 0.44–1.00)
GFR, Estimated: >60 mL/min (ref >60)
Glucose, Bld: 95 mg/dL (ref 70–99)
Potassium: 3.8 mmol/L (ref 3.5–5.1)
Sodium: 135 mmol/L (ref 135–145)
Total Bilirubin: 0.9 mg/dL (ref 0.3–1.2)
Total Protein: 6.3 g/dL — ABNORMAL LOW (ref 6.5–8.1)

## 2023-02-17 LAB — CBC
HCT: 36.8 % (ref 36.0–46.0)
Hemoglobin: 11.6 g/dL — ABNORMAL LOW (ref 12.0–15.0)
MCH: 28.2 pg (ref 26.0–34.0)
MCHC: 31.5 g/dL (ref 30.0–36.0)
MCV: 89.3 fL (ref 80.0–100.0)
Platelets: 335 10*3/uL (ref 150–400)
RBC: 4.12 MIL/uL (ref 3.87–5.11)
RDW: 16.7 % — ABNORMAL HIGH (ref 11.5–15.5)
WBC: 17.6 10*3/uL — ABNORMAL HIGH (ref 4.0–10.5)
nRBC: 0 % (ref 0.0–0.2)

## 2023-02-17 LAB — URINALYSIS, ROUTINE W REFLEX MICROSCOPIC
Bilirubin Urine: NEGATIVE
Glucose, UA: NEGATIVE mg/dL
Hgb urine dipstick: NEGATIVE
Ketones, ur: 20 mg/dL — AB
Leukocytes,Ua: NEGATIVE
Nitrite: NEGATIVE
Protein, ur: NEGATIVE mg/dL
Specific Gravity, Urine: 1.015 (ref 1.005–1.030)
pH: 5 (ref 5.0–8.0)

## 2023-02-17 LAB — TROPONIN I (HIGH SENSITIVITY): Troponin I (High Sensitivity): 10 ng/L (ref <18)

## 2023-02-17 MED ORDER — BISACODYL 10 MG RE SUPP: 10.0000 mg | Freq: Every day | RECTAL | Status: DC | PRN

## 2023-02-17 MED ORDER — DOCUSATE SODIUM 100 MG PO CAPS
100.0000 mg | ORAL_CAPSULE | Freq: Two times a day (BID) | ORAL | Status: DC
  Administered 2023-02-17 – 2023-02-18 (×4): 100 mg via ORAL
  Filled 2023-02-17 (×6): qty 1

## 2023-02-17 NOTE — Evaluation (Signed)
Physical Therapy Evaluation Patient Details Name: Alicia Vasquez [facility] MRN: [id number] DOB: 1940/06/09 Today's Date: 02/17/2023  History of Present Illness  82 yo female presents to therapy from [facility] following hospital admission on 01/31/2023 due to abdominal, chest and back pain and N and V. Pt was found to be hypertensive and borderline tachycardic and CT indicative of acute pancreatitis. Pt provided mediation to address pain and nausea and started on IV fluids with recommendation for conservative measures and bowel rest. . Pt has PMH including but not limited to: metastatic Ba Ca, DVT, HTN, L hip fx s/p ORIF, L THA (2018).  Clinical Impression      Pt admitted with above diagnosis.  Pt currently with functional limitations due to the deficits listed below (see PT Problem List). Pt in bed when PT arrived. Pt indicated that she could not do anything and this therapist needed to call Alicia Vasquez, pts legal guardian. Pt unable to phone and pt reported therapist could use her phone. PT able to speak with Alicia Vasquez to establish PLOF and indicated pt could participate with evaluation. Pt indicated that she was having abdominal and back pain, Alicia Vasquez reported multiple vertebral fxs related to metastatic ca. Pt did not require supplemental O2 at baseline with pt residing at [facility]. Pt 96% on 2 L/min at rest and 94% on 1 L/min s/p sitting EOB. Pt required max A x 2 for supine <> sit, mod A to maintain sitting EOB with strong posterior lean. Pt returned to bed and all needs in place and nurse made aware of pt need for pain medication and use of mechanical lift if pt is to get OOB at this time. Pt appears to have a guarded rehab potential due to medical complexity, pain and apparent cognitive deficits at time of eval.  Patient will benefit from continued therapy services , <3 hours/day.  Pt will benefit from acute skilled PT to increase their independence and safety with mobility to allow discharge.        If plan is discharge home, recommend the following: Two people to help with walking and/or transfers;A lot of help with bathing/dressing/bathroom;Assistance with cooking/housework;Direct supervision/assist for medications management;Direct supervision/assist for financial management;Assist for transportation;Help with stairs or ramp for entrance;Supervision due to cognitive status   Can travel by private vehicle        Equipment Recommendations None recommended by PT  Recommendations for Other Services       Functional Status Assessment Patient has had a recent decline in their functional status and demonstrates the ability to make significant improvements in function in a reasonable and predictable amount of time.     Precautions / Restrictions Precautions Precautions: Fall Restrictions Weight Bearing Restrictions: No      Mobility  Bed Mobility Overal bed mobility: Needs Assistance Bed Mobility: Sit to Supine, Supine to Sit     Supine to sit: Max assist, +2 for physical assistance, +2 for safety/equipment, HOB elevated Sit to supine: Max assist, +2 for physical assistance, +2 for safety/equipment   General bed mobility comments: pt required increased time max A and multimodal cues with constant encouragement to sit EOB    Transfers                   General transfer comment: NT due to extensive assist to obtain EOB and pt adamently declining    Ambulation/Gait               General Gait Details: NT  Stairs  Wheelchair Mobility     Tilt Bed    Modified Rankin (Stroke Patients Only)       Balance Overall balance assessment: Needs assistance (legal guardian denied falls) Sitting-balance support: Feet unsupported, Bilateral upper extremity supported Sitting balance-Leahy Scale: Zero Sitting balance - Comments: pt exhibited strong pushing response EOB and required mod to max A to maintain sitting balance, pt required  facilitation for B LEs on floor and max cues for encouragment throughout                                     Pertinent Vitals/Pain Pain Assessment Pain Assessment: Faces Faces Pain Scale: Hurts whole lot Pain Location: back and abdomen Pain Descriptors / Indicators: Crying, Grimacing, Guarding Pain Intervention(s): Limited activity within patient's tolerance, Monitored during session, Premedicated before session, Patient requesting pain meds-RN notified    Home Living Family/patient expects to be discharged to:: Assisted living                 Home Equipment: Agricultural consultant (2 wheels)      Prior Function Prior Level of Function : Needs assist             Mobility Comments: Pt is a poor historian. PT spoke with Alicia Vasquez, legal guardian via phone with pt present to establish PLOF. Per Alicia Vasquez pt has had a significant functional decline over the past 3-4 weeks and initally attributed to dental pain with recent teeth extraction, prior pt was able to amb up to 40 feet with RW and therapist at ALF. Pt required increasing assist days preceeding hospitalzation and no longer ambulating. pt requires A for all functional mobility and ADLs       Extremity/Trunk Assessment        Lower Extremity Assessment Lower Extremity Assessment: Generalized weakness    Cervical / Trunk Assessment Cervical / Trunk Assessment: Kyphotic (head forward)  Communication   Communication Communication: Hearing impairment  Cognition Arousal: Alert Behavior During Therapy: Anxious Overall Cognitive Status: Impaired/Different from baseline Area of Impairment: Safety/judgement, Awareness                         Safety/Judgement: Decreased awareness of deficits              General Comments      Exercises     Assessment/Plan    PT Assessment Patient needs continued PT services  PT Problem List Decreased strength;Decreased activity tolerance;Decreased  balance;Decreased mobility;Decreased coordination;Pain;Cardiopulmonary status limiting activity       PT Treatment Interventions DME instruction;Gait training;Functional mobility training;Therapeutic activities;Therapeutic exercise;Balance training;Neuromuscular re-education;Patient/family education    PT Goals (Current goals can be found in the Care Plan section)  Acute Rehab PT Goals PT Goal Formulation: Patient unable to participate in goal setting    Frequency Min 1X/week     Co-evaluation               AM-PAC PT "6 Clicks" Mobility  Outcome Measure Help needed turning from your back to your side while in a flat bed without using bedrails?: A Lot Help needed moving from lying on your back to sitting on the side of a flat bed without using bedrails?: Total Help needed moving to and from a bed to a chair (including a wheelchair)?: Total Help needed standing up from a chair using your arms (e.g., wheelchair or bedside chair)?: Total Help needed to  walk in hospital room?: Total Help needed climbing 3-5 steps with a railing? : Total 6 Click Score: 7    End of Session Equipment Utilized During Treatment: Oxygen Activity Tolerance: Patient limited by pain Patient left: in bed;with call bell/phone within reach Nurse Communication: Mobility status;Need for lift equipment;Patient requests pain meds PT Visit Diagnosis: Unsteadiness on feet (R26.81);Other abnormalities of gait and mobility (R26.89);Muscle weakness (generalized) (M62.81);Difficulty in walking, not elsewhere classified (R26.2);Pain    Time: 1430-1449 PT Time Calculation (min) (ACUTE ONLY): 19 min   Charges:   PT Evaluation $PT Eval Moderate Complexity: 1 Mod   PT General Charges $$ ACUTE PT VISIT: 1 Visit         Johnny Bridge, PT Acute Rehab   Jacqualyn Posey 02/17/2023, 4:09 PM

## 2023-02-17 NOTE — TOC Initial Note (Signed)
Transition of Care [facility]) - Initial/Assessment Note    Patient Details  Name: Alicia Vasquez [facility] MRN: [id number] Date of Birth: December 19, 1940  Transition of [facility]) CM/SW Contact:    Harriett Sine, RN Phone Number:[phone]  02/17/2023, 10:00 AM  Clinical Narrative:                 Spoke with pt at bedside. Pt is from [facility]. No SDOH or TOC needs at this time, following  Expected Discharge Plan: Skilled Nursing Facility Barriers to Discharge: No Barriers Identified   Patient Goals and CMS Choice            Expected Discharge Plan and Services       Living arrangements for the past 2 months: Skilled Nursing Facility                                      Prior Living Arrangements/Services Living arrangements for the past 2 months: Skilled Nursing Facility Lives with:: Facility Resident Patient language and need for interpreter reviewed:: Yes        Need for Family Participation in Patient Care: No (Comment) Care giver support system in place?: Yes (comment)   Criminal Activity/Legal Involvement Pertinent to Current Situation/Hospitalization: No - Comment as needed  Activities of Daily Living Home Assistive Devices/Equipment: Wheelchair, Shower chair with back, Raised toilet seat with rails ADL Screening (condition at time of admission) Patient's cognitive ability adequate to safely complete daily activities?: Yes Is the patient deaf or have difficulty hearing?: No Does the patient have difficulty seeing, even when wearing glasses/contacts?: No Does the patient have difficulty concentrating, remembering, or making decisions?: No Patient able to express need for assistance with ADLs?: Yes Does the patient have difficulty dressing or bathing?: Yes Independently performs ADLs?: No Communication: Independent Dressing (OT): Needs assistance Is this a change from baseline?: Pre-admission baseline Grooming: Needs assistance Is this a change from  baseline?: Pre-admission baseline Feeding: Independent Bathing: Dependent Is this a change from baseline?: Pre-admission baseline Toileting: Needs assistance Is this a change from baseline?: Pre-admission baseline In/Out Bed: Needs assistance Is this a change from baseline?: Pre-admission baseline Walks in Home: Needs assistance Is this a change from baseline?: Pre-admission baseline Does the patient have difficulty walking or climbing stairs?: Yes Weakness of Legs: Both Weakness of Arms/Hands: None  Permission Sought/Granted   Permission granted to share information with : Yes, Verbal Permission Granted              Emotional Assessment Appearance:: Appears stated age Attitude/Demeanor/Rapport: Lethargic Affect (typically observed): Accepting, Calm Orientation: : Oriented to Self, Oriented to Place, Oriented to  Time, Oriented to Situation Alcohol / Substance Use: Never Used Psych Involvement: No (comment)  Admission diagnosis:  Acute pancreatitis [K85.90] Primary hypertension [I10] Epigastric pain [R10.13] Acute pancreatitis, unspecified complication status, unspecified pancreatitis type [K85.90] Patient Active Problem List   Diagnosis Date Noted   Acute pancreatitis 02/03/2023   VTE (venous thromboembolism) 11/24/2022   Poor social situation 09/02/2022   GERD (gastroesophageal reflux disease) 06/17/2021   Bilateral leg edema 06/03/2021   Pressure injury of left buttock, stage 1 06/03/2021   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   Debility 04/27/2021   Morbid (severe) obesity due to excess calories (HCC) 05/31/2019   Major depressive disorder, single episode, unspecified 05/31/2019   Aortic atherosclerosis (HCC) 06/13/2017   Malignant neoplasm metastatic to bone (HCC) 01/25/2017   Endometrial hyperplasia 01/16/2017  Lytic bone lesion of left femur 01/15/2017   Hip fracture, pathological (HCC) 01/15/2017   Lymphedema 09/17/2015   Bilateral knee pain 04/03/2015   Arm edema  08/28/2014   Vitamin D deficiency 04/23/2014   Hyperparathyroidism (HCC) 04/23/2014   Depression 07/18/2013   Malignant neoplasm of upper-outer quadrant of right breast in female, estrogen receptor positive (HCC) 07/15/2013   Overactive bladder 01/30/2013   Essential hypertension 09/27/2011   Hearing loss 09/27/2011   Seasonal allergies 09/27/2011   Hypothyroidism 08/26/2011   PCP:  Cecille Rubin, FNP Pharmacy:   [facility] [place], [place] - [address] [place] [address] Phone: [phone] Fax: [phone]  [id number] [place], [place] - [address] AT [facility] OF [facility] [address] [place] [address] Phone: [phone] Fax: [phone]  [facility] - [place], [place] - [address] [place] [address] Phone: [phone] Fax: [phone]     Social Determinants of Health (SDOH) Social History: SDOH Screenings   Food Insecurity: No Food Insecurity (02/09/2023)  Housing: Low Risk  (02/13/2023)  Transportation Needs: No Transportation Needs (02/10/2023)  Recent Concern: Transportation Needs - Unmet Transportation Needs (12/12/2022)  Utilities: Not At Risk (02/23/2023)  Depression (PHQ2-9): Low Risk  (10/10/2022)  Financial Resource Strain: High Risk (10/10/2022)  Physical Activity: Inactive (10/10/2022)  Social Connections: Socially Isolated (10/10/2022)  Stress: Stress Concern Present (10/10/2022)  Tobacco Use: Low Risk  (02/09/2023)   SDOH Interventions:     Readmission Risk Interventions    03/16/2022   11:19 AM  Readmission Risk Prevention Plan  Post Dischage Appt Complete  Medication Screening Complete  Transportation Screening Complete

## 2023-02-17 NOTE — Patient Outreach (Signed)
Care Coordination   Follow Up Visit Note   02/12/2023 Name: Alicia Vasquez MRN: [id number] DOB: March 06, 1941  Alicia Vasquez is a 82 y.o. year old female who sees Cecille Rubin, FNP for primary care. I spoke with  Ardith Dark Tipps's caregiver, Alicia Vasquez, by phone today.  What matters to the patients health and wellness today?  Symptom Management    Goals Addressed             This Visit's Progress    Obtain Supportive Resources-Managing MH Symptoms   On track    Activities and task to complete in order to accomplish goals.   Keep all upcoming appointments discussed today Continue with compliance of taking medication prescribed by Doctor. Pt's Caregiver will f/u with Oncology regarding medication requests Implement healthy coping skills discussed to assist with management of symptoms  Follow up with [employer], as needed for advocacy needs Inform Oncology Palliative Care of pt establishing with [facility]        SDOH assessments and interventions completed:  No     Care Coordination Interventions:  Yes, provided  Interventions Today    Flowsheet Row Most Recent Value  Chronic Disease   Chronic disease during today's visit Hypertension (HTN), Other  [MDD and Hx of cancer]  General Interventions   General Interventions Discussed/Reviewed General Interventions Reviewed, Doctor Visits, Level of Care  [Pt has established with [facility] Palliative Care, who visits pt at ALF. She is scheduled to have four teeth pulled next week and feels overwhelmed managing chronic health conditions]  Doctor Visits Discussed/Reviewed Doctor Visits Reviewed  Mental Health Interventions   Mental Health Discussed/Reviewed Mental Health Reviewed, Coping Strategies       Follow up plan: Follow up call scheduled for 2-4 weeks    Encounter Outcome:  Patient Visit Completed   Alicia Vasquez, Alicia Vasquez, Alicia Vasquez [facility] Care Management [name]  [email or web address] Phone [phone] 5:14 AM

## 2023-02-17 NOTE — Patient Instructions (Signed)
Visit Information  Thank you for taking time to visit with me today. Please don't hesitate to contact me if I can be of assistance to you.   Following are the goals we discussed today:   Goals Addressed             This Visit's Progress    Obtain Supportive Resources-Managing MH Symptoms   On track    Activities and task to complete in order to accomplish goals.   Keep all upcoming appointments discussed today Continue with compliance of taking medication prescribed by Doctor. Pt's Caregiver will f/u with Oncology regarding medication requests Implement healthy coping skills discussed to assist with management of symptoms  Follow up with [employer], as needed for advocacy needs Inform Oncology Palliative Care of pt establishing with [facility]        Our next appointment is by telephone on 09/26 at 2 PM  Please call the care guide team at [phone] if you need to cancel or reschedule your appointment.   If you are experiencing a Mental Health or Behavioral Health Crisis or need someone to talk to, please call the Suicide and Crisis Lifeline: 988 call 911   The patient verbalized understanding of instructions, educational materials, and care plan provided today and DECLINED offer to receive copy of patient instructions, educational materials, and care plan.   [name], MSW, LCSW [facility] Care Management [place]  [facility] [email or web address] Phone [phone] 5:15 AM

## 2023-02-17 NOTE — Progress Notes (Signed)
[facility] Cancer Follow up:    Alicia Rubin, FNP [address] [place] [address]   DIAGNOSIS:  Cancer Staging  Malignant neoplasm of upper-outer quadrant of right breast in female, estrogen receptor positive (HCC) Staging form: Breast, AJCC 7th Edition - Clinical: Stage IIA (T1, N1, cM0) - Unsigned Specimen type: Core Needle Biopsy Histopathologic type: 9931 Laterality: Right Staging comments: Staged at breast conference 07/24/13.  - Pathologic: Stage IV (M1) - Unsigned Specimen type: Core Needle Biopsy Histopathologic type: 9931 Laterality: Right   SUMMARY OF ONCOLOGIC HISTORY: 82 y.o. Alicia Vasquez status post right breast upper outer quadrant lumpectomy and sentinel lymph node sampling 09/09/2013 for an mpT1c pN1a, stage IIA invasive ductal carcinoma, estrogen and progesterone receptor both 100% positive with strong staining intensity, MIB-1 of 17% and no HER-2 amplification   (1) additional surgery for margin clearance 09/16/2013 obtained negative margins   (2) Oncotype DX recurrence score of 4 predicts a risk of outside the breast recurrence within 10 years of 7% if the patient's only systemic therapy is tamoxifen for 5 years. It also predicts no benefit from chemotherapy   (3) adjuvant radiation completed 01/07/2014   (4) anastrozole started 02/27/2014 stopped within 2 weeks because of arm swelling.              (a) bone density April 2016 showed osteopenia, with a t-score of -1.6             (b) anastrozole resumed 12/17/2015             (c) Bone density 09/20/2016 showed a T score of -2.2   (5) history of left lower extremity DVT 11/23/2012, initially on rivaroxaban, which caused chest pain, switch to Coumadin July 2014  METASTATIC DISEASE: August 2018 (6) status post left total hip replacement 01/16/2017 for estrogen receptor positive adenocarcinoma.             (a) CA 27-29 was 46.4 as of 04/18/2017             (b) chest  CT scan 05/02/2017 shows no lung or liver lesion concern; it does show aortic atherosclerosis             (c) baseline bone scan 05/02/2017 was negative             (d) PET scan 09/12/2017 shows no active disease, including bone             (e) PET scan and CT chest on 05/29/2018 show no active disease             (f) chest CT and bone scan 03/18/2019 showed no evidence of active disease             (g) lumbar spine MRI 01/06/2021 shows multiple compression fractures but no obvious metastases; noncontrast cervical spine and head CT scans 01/04/2021 showed no evidence of neoplastic disease   (7) fulvestrant started 04/18/2017   (8) denosumab/Xgeva started 05/16/2017             (a) changed to every 12-weeks after 10/03/2017 dose              (b) held starting with 06/13/2018 dose due to dental concerns   (9) unprovoked deep vein thrombosis involving the left posterior tibial v documented 11/23/2012, resolved on repeat 06/27/2013 and not recurrent on multiple Dopplers since, most recent 03/06/2018             (a) on chronic anticoagulation  with warfarin given ongoing risks (stage IV breast cancer, relative immobility secondary to claudication. This has been held because of falls and increased risk of bleeding  CURRENT THERAPY: Faslodex  INTERVAL HISTORY: Alicia Vasquez 82 y.o. female is admitted to the hospital with abdominal pain and chest pain. She is diagnosed to have acute pancreatitis. Also was noted new onset liver and worsening skeletal disease concerning for disease progression. Alicia Vasquez, her HPOA was with her. Patient is sound asleep and snoring. I didn't disturb her since she was in a lot of pain and didn't get much rest. Alicia Vasquez says she and Alicia Vasquez have briefly discussed about the worsening met disease and she became very anxious even thinking about it. Alicia Vasquez also mentions that she is not ready to die and might want to try more treatment.  Rest of the pertinent 10 point  ROS reviewed and neg.  Patient Active Problem List   Diagnosis Date Noted   Acute pancreatitis 23-Feb-2023   VTE (venous thromboembolism) 11/24/2022   Poor social situation 09/02/2022   GERD (gastroesophageal reflux disease) 06/17/2021   Bilateral leg edema 06/03/2021   Pressure injury of left buttock, stage 1 06/03/2021   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   Debility 04/27/2021   Morbid (severe) obesity due to excess calories (HCC) 05/31/2019   Major depressive disorder, single episode, unspecified 05/31/2019   Aortic atherosclerosis (HCC) 06/13/2017   Malignant neoplasm metastatic to bone (HCC) 01/25/2017   Endometrial hyperplasia 01/16/2017   Lytic bone lesion of left femur 01/15/2017   Hip fracture, pathological (HCC) 01/15/2017   Lymphedema 09/17/2015   Bilateral knee pain 04/03/2015   Arm edema 08/28/2014   Vitamin D deficiency 04/23/2014   Hyperparathyroidism (HCC) 04/23/2014   Depression 07/18/2013   Malignant neoplasm of upper-outer quadrant of right breast in female, estrogen receptor positive (HCC) 07/15/2013   Overactive bladder 01/30/2013   Essential hypertension 09/27/2011   Hearing loss 09/27/2011   Seasonal allergies 09/27/2011   Hypothyroidism 08/26/2011    is allergic to anesthetics, amide; benadryl [diphenhydramine hcl]; carbocaine [mepivacaine hcl]; codeine; epinephrine; sulfa antibiotics; diphenhydramine; latex; macrobid [nitrofurantoin]; penicillin g; penicillins; and tramadol.  MEDICAL HISTORY: 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    Hypertension    Hypothyroid    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   Personal history of radiation therapy    Pneumonia    PONV (postoperative nausea and vomiting)    Radiation 11/21/13-01/07/14   Right  Breast/Supraclavicular    SURGICAL HISTORY: Past Surgical History:  Procedure Laterality Date   BREAST LUMPECTOMY Left 2015   BREAST LUMPECTOMY WITH RADIOACTIVE SEED LOCALIZATION Right 09/09/2013   Procedure: BREAST LUMPECTOMY WITH RADIOACTIVE SEED LOCALIZATION WITH AXILLARY NODE EXCISION;  Surgeon: Emelia Loron, MD;  Location: [facility];  Service: General;  Laterality: Right;   CYSTOSCOPY W/ URETERAL STENT PLACEMENT Right 01/07/2021   Procedure: CYSTOSCOPY WITH RETROGRADE PYELOGRAM/URETERAL STENT PLACEMENT;  Surgeon: Alfredo Martinez, MD;  Location: [facility];  Service: Urology;  Laterality: Right;   CYSTOSCOPY/URETEROSCOPY/HOLMIUM LASER/STENT PLACEMENT Right 02/08/2021   Procedure: CYSTOSCOPY, RIGHT URETEROSCOPY, RIGHT RETRGRADE PYELOGRAM, HOLMIUM LASER/STENT PLACEMENT;  Surgeon: Crista Elliot, MD;  Location: [facility];  Service: Urology;  Laterality: Right;   DENTAL SURGERY  04/19/2012  13 TEETH REMOVED   DILATION AND CURETTAGE OF UTERUS     IR RADIOLOGIST EVAL & MGMT  01/21/2021   ORIF PERIPROSTHETIC FRACTURE Left 01/31/2017   Procedure: REVISION and OPEN REDUCTION INTERNAL FIXATION (ORIF) PERIPROSTHETIC FRACTURE LEFT HIP;  Surgeon: Durene Romans, MD;  Location: [facility];  Service: Orthopedics;  Laterality: Left;  120 mins   RE-EXCISION OF BREAST LUMPECTOMY Right 09/24/2013   Procedure: RE-EXCISION OF RIGHT BREAST LUMPECTOMY;  Surgeon: Emelia Loron, MD;  Location: [facility];  Service: General;  Laterality: Right;   TOTAL HIP ARTHROPLASTY Left 01/16/2017   Procedure: TOTAL HIP ARTHROPLASTY POSTERIOR;  Surgeon: Durene Romans, MD;  Location: [facility];  Service: Orthopedics;  Laterality: Left;    SOCIAL HISTORY: Social History   Socioeconomic History   Marital status: Widowed    Spouse name: Not on file   Number of children: 1   Years of education: Not on file   Highest education level: Not on file  Occupational History   Not on file  Tobacco Use    Smoking status: Never   Smokeless tobacco: Never  Vaping Use   Vaping status: Never Used  Substance and Sexual Activity   Alcohol use: No    Alcohol/week: 0.0 standard drinks of alcohol   Drug use: No   Sexual activity: Never  Other Topics Concern   Not on file  Social History Narrative   Exercise: yard work.   Social Determinants of Health   Financial Resource Strain: High Risk (10/10/2022)   Overall Financial Resource Strain (CARDIA)    Difficulty of Paying Living Expenses: Hard  Food Insecurity: No Food Insecurity (03-15-23)   Hunger Vital Sign    Worried About Running Out of Food in the Last Year: Never true    Ran Out of Food in the Last Year: Never true  Transportation Needs: No Transportation Needs (03/15/23)   PRAPARE - Administrator, Civil Service (Medical): No    Lack of Transportation (Non-Medical): No  Recent Concern: Transportation Needs - Unmet Transportation Needs (12/12/2022)   PRAPARE - Transportation    Lack of Transportation (Medical): Yes    Lack of Transportation (Non-Medical): Yes  Physical Activity: Inactive (10/10/2022)   Exercise Vital Sign    Days of Exercise per Week: 0 days    Minutes of Exercise per Session: 0 min  Stress: Stress Concern Present (10/10/2022)   [employer] of Occupational Health - Occupational Stress Questionnaire    Feeling of Stress : Very much  Social Connections: Socially Isolated (10/10/2022)   Social Connection and Isolation Panel [NHANES]    Frequency of Communication with Friends and Family: More than three times a week    Frequency of Social Gatherings with Friends and Family: Twice a week    Attends Religious Services: Never    Database administrator or Organizations: No    Attends Banker Meetings: Never    Marital Status: Widowed  Intimate Partner Violence: Not At Risk (Mar 15, 2023)   Humiliation, Afraid, Rape, and Kick questionnaire    Fear of Current or Ex-Partner: No    Emotionally  Abused: No    Physically Abused: No    Sexually Abused: No    FAMILY HISTORY: Family History  Problem Relation Age of Onset   Heart disease Brother    Colon cancer Brother    Prostate cancer Brother     Review of Systems  Constitutional:  Negative for appetite change, chills, fatigue, fever and unexpected  weight change.  HENT:   Negative for hearing loss, lump/mass and trouble swallowing.   Eyes:  Negative for eye problems and icterus.  Respiratory:  Negative for chest tightness, cough and shortness of breath.   Cardiovascular:  Negative for chest pain, leg swelling and palpitations.  Gastrointestinal:  Negative for abdominal distention, abdominal pain, constipation, diarrhea, nausea and vomiting.  Endocrine: Negative for hot flashes.  Genitourinary:  Negative for difficulty urinating.   Musculoskeletal:  Negative for arthralgias.  Skin:  Negative for itching and rash.  Neurological:  Negative for dizziness, extremity weakness, headaches and numbness.  Hematological:  Negative for adenopathy. Does not bruise/bleed easily.  Psychiatric/Behavioral:  Negative for depression. The patient is not nervous/anxious.       PHYSICAL EXAMINATION    Vitals:   02/17/23 1149 02/17/23 1321  BP: (!) 159/74 (!) 141/74  Pulse: 95 98  Resp: 16 16  Temp: 98.8 F (37.1 C) 98.5 F (36.9 C)  SpO2: 98% 97%   Patient resting. PE deferred.  LABORATORY DATA:  CBC    Component Value Date/Time   WBC 17.6 (H) 02/17/2023 0012   RBC 4.12 02/17/2023 0012   HGB 11.6 (L) 02/17/2023 0012   HGB 12.3 01/20/2023 1133   HGB 11.6 05/16/2017 1417   HCT 36.8 02/17/2023 0012   HCT 36.7 05/16/2017 1417   PLT 335 02/17/2023 0012   PLT 294 01/20/2023 1133   PLT 323 05/16/2017 1417   MCV 89.3 02/17/2023 0012   MCV 80.8 05/16/2017 1417   MCH 28.2 02/17/2023 0012   MCHC 31.5 02/17/2023 0012   RDW 16.7 (H) 02/17/2023 0012   RDW 16.8 (H) 05/16/2017 1417   LYMPHSABS 1.5 01/20/2023 1133   LYMPHSABS 1.1  05/16/2017 1417   MONOABS 1.5 (H) 01/20/2023 1133   MONOABS 0.8 05/16/2017 1417   EOSABS 0.3 01/20/2023 1133   EOSABS 0.2 05/16/2017 1417   BASOSABS 0.1 01/20/2023 1133   BASOSABS 0.1 05/16/2017 1417    CMP     Component Value Date/Time   NA 135 02/17/2023 0012   NA 141 05/16/2017 1416   K 3.8 02/17/2023 0012   K 3.8 05/16/2017 1416   CL 103 02/17/2023 0012   CO2 23 02/17/2023 0012   CO2 27 05/16/2017 1416   GLUCOSE 95 02/17/2023 0012   GLUCOSE 140 05/16/2017 1416   BUN 16 02/17/2023 0012   BUN 13.2 05/16/2017 1416   CREATININE 0.83 02/17/2023 0012   CREATININE 1.00 01/20/2023 1133   CREATININE 0.80 09/22/2017 1555   CREATININE 0.8 05/16/2017 1416   CALCIUM 10.4 (H) 02/17/2023 0012   CALCIUM 11.0 (H) 05/16/2017 1416   PROT 6.3 (L) 02/17/2023 0012   PROT 6.9 05/16/2017 1416   ALBUMIN 2.8 (L) 02/17/2023 0012   ALBUMIN 3.6 05/16/2017 1416   AST 32 02/17/2023 0012   AST 29 01/20/2023 1133   AST 13 05/16/2017 1416   ALT 17 02/17/2023 0012   ALT 27 01/20/2023 1133   ALT <6 05/16/2017 1416   ALKPHOS 201 (H) 02/17/2023 0012   ALKPHOS 130 05/16/2017 1416   BILITOT 0.9 02/17/2023 0012   BILITOT 0.5 01/20/2023 1133   BILITOT 0.31 05/16/2017 1416   GFRNONAA >60 02/17/2023 0012   GFRNONAA 56 (L) 01/20/2023 1133   GFRNONAA 75 08/19/2015 1602   GFRAA >60 08/07/2019 1305   GFRAA >60 03/21/2018 1417   GFRAA 87 08/19/2015 1602        ASSESSMENT and THERAPY PLAN:   No problem-specific Assessment & Plan notes found  for this encounter.  Zayde Burgus Smithey is a 82 y.o. 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   #Metastatic breast cancer with evidence for progression. --Currently on Fulvestrant injection q 4 weeks.  --Despite multiple requests to proceed with repeat scans, she didn't proceed with scans for almost a yr and half. --During hospital admission, she had CT  imaging which clearly demonstrated progression with new disease in liver, worsening skeletal disease, abdominal lymphadenopathy, multiple back fractures. -- She was sound asleep but according to [facility], she wants to try more treatment and she is not ready to go. --Given progression after almost 5 yrs, I would recommend considering liver biopsy while in patient for [name] to check back on prognostics and send for foundation testing. If this is not feasible, we can attempt guardant 360 on blood. -- If still ER and PR pos on repeat biopsy, then we can consider low dose Ibrance and faslodex or low dose xeloda. -- We can follow up with her in clinic as well and discuss treatment recommendation once acute pancreatitis resolves. Total time spent: 35 min  *Total Encounter Time as defined by the Centers for Medicare and Medicaid Services includes, in addition to the face-to-face time of a patient visit (documented in the note above) non-face-to-face time: obtaining and reviewing outside history, ordering and reviewing medications, tests or procedures, care coordination (communications with other health care professionals or caregivers) and documentation in the medical record.

## 2023-02-17 NOTE — Plan of Care (Signed)

## 2023-02-17 NOTE — Progress Notes (Signed)
PROGRESS NOTE    Alicia Vasquez  MVH:[id number] DOB: 1940-08-14 DOA: 02/24/2023 PCP: Cecille Rubin, FNP    Brief Narrative:  Alicia Vasquez is a 82 y.o. 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  History provided by the patient as well as her close friend and healthcare power of attorney who is at the bedside with her.  She lives in the facility, for the last 3 to 4 days, she has been having upper abdominal and chest pain, with some pain radiating to her back.     Assessment and Plan: Acute pancreatitis-unclear etiology at this time -N.p.o. except for ice chips and sips with meds -IV lactated Ringer's infusion -Pain and nausea medication as needed -triglycerides: 175 -CT scan: Interval development of diffuse hepatic and skeletal metastatic disease since the 07/28/2021 exam.  Interval development just since yesterday CT findings of acute interstitial pancreatitis with peripancreatic fluid tracking along the pancreatic tail into the upper left paracolic gutter with trace amount in the posterior pelvis. No pancreatic necrosis or ductal dilatation.   Hypertension -Continue home lisinopril -IV hydralazine for uncontrolled hypertension   Hypothryoidism -continue Synthroid   Breast cancer -on monthly fulvestrant injections -CT scan: with worsening metastasis-- oncology to see but most likely will not be a candidate for aggressive treatment -palliative care consult       DVT prophylaxis: enoxaparin (LOVENOX) injection 40 mg Start: 02/17/23 1000 SCDs Start: 02/07/2023 2202    Code Status: Full Code Family Communication: called [name]  Disposition Plan:  Level of care: Med-Surg Status is: Inpatient     Consultants:  oncology   Subjective: Very hard of hearing  Objective: Vitals:   02/17/23 0021 02/17/23 0317 02/17/23 0739  02/17/23 1149  BP:  (!) 152/71 (!) 141/61 (!) 159/74  Pulse:  96 95 95  Resp:  16 16 16   Temp:  98.4 F (36.9 C) 98.7 F (37.1 C) 98.8 F (37.1 C)  TempSrc:  Oral Oral Oral  SpO2: 95% 98% 98% 98%  Weight:        Intake/Output Summary (Last 24 hours) at 02/17/2023 1205 Last data filed at 02/17/2023 1100 Gross per 24 hour  Intake 1833.87 ml  Output --  Net 1833.87 ml   Filed Weights   02/06/2023 2327  Weight: 86.7 kg    Examination:   General: Appearance:    Obese female in no acute distress     Lungs:     respirations unlabored  Heart:    Normal heart rate. .    MS:   All extremities are intact.    Neurologic:   Awake, alert       Data Reviewed: I have personally reviewed following labs and imaging studies  CBC: Recent Labs  Lab 02/07/2023 1840 02/17/23 0012  WBC 17.7* 17.6*  HGB 12.6 11.6*  HCT 39.9 36.8  MCV 89.1 89.3  PLT 384 335   Basic Metabolic Panel: Recent Labs  Lab 02/03/2023 1840 02/17/23 0012  NA 137 135  K 4.1 3.8  CL 102 103  CO2 23 23  GLUCOSE 120* 95  BUN 17 16  CREATININE 0.76 0.83  CALCIUM 11.0* 10.4*   GFR: Estimated Creatinine Clearance: 54.7 mL/min (by C-G formula based on SCr of 0.83 mg/dL). Liver Function Tests: Recent Labs  Lab 02/14/2023 1840 02/17/23 0012  AST 35 32  ALT 17 17  ALKPHOS 219* 201*  BILITOT 1.0 0.9  PROT 7.3 6.3*  ALBUMIN 3.1* 2.8*   Recent Labs  Lab 02/08/2023 1840 02/17/23 0012  LIPASE 615* 504*   No results for input(s): "AMMONIA" in the last 168 hours. Coagulation Profile: No results for input(s): "INR", "PROTIME" in the last 168 hours. Cardiac Enzymes: No results for input(s): "CKTOTAL", "CKMB", "CKMBINDEX", "TROPONINI" in the last 168 hours. BNP (last 3 results) No results for input(s): "PROBNP" in the last 8760 hours. HbA1C: No results for input(s): "HGBA1C" in the last 72 hours. CBG: No results for input(s): "GLUCAP" in the last 168 hours. Lipid Profile: Recent Labs    02/07/2023 1840   TRIG 175*   Thyroid Function Tests: No results for input(s): "TSH", "T4TOTAL", "FREET4", "T3FREE", "THYROIDAB" in the last 72 hours. Anemia Panel: No results for input(s): "VITAMINB12", "FOLATE", "FERRITIN", "TIBC", "IRON", "RETICCTPCT" in the last 72 hours. Sepsis Labs: No results for input(s): "PROCALCITON", "LATICACIDVEN" in the last 168 hours.  No results found for this or any previous visit (from the past 240 hour(s)).       Radiology Studies: CT Angio Chest/Abd/Pel for Dissection W and/or Wo Contrast  Result Date: 02/13/2023 CLINICAL DATA:  Stage IV breast cancer, severe back pain, abdominal pain and severe hypertension on presentation. Evaluate for aortic dissection. Patient suffered a fall injury 02/14/2023 and could also have a spinal fracture. EXAM: CT ANGIOGRAPHY CHEST, ABDOMEN AND PELVIS TECHNIQUE: Initial chest CT without contrast was performed to the adrenal glands to evaluate for aortic hematoma. Multidetector CT imaging through the chest, abdomen and pelvis was performed using the standard protocol during bolus administration of intravenous contrast. Multiplanar reconstructed images and MIPs were obtained and reviewed to evaluate the vascular anatomy. RADIATION DOSE REDUCTION: This exam was performed according to the departmental dose-optimization program which includes automated exposure control, adjustment of the mA and/or kV according to patient size and/or use of iterative reconstruction technique. CONTRAST:  OMNIPAQUE IOHEXOL 350 MG/ML SOLN COMPARISON:  Cancer restaging chest, abdomen and pelvis CT with IV contrast yesterday 02/15/2023, CT chest, abdomen and pelvis with IV contrast 07/28/2021. FINDINGS: CTA CHEST FINDINGS Cardiovascular: The cardiac size is normal. Chronic calcification again noted in the left inferolateral marker ring. There is no pericardial effusion no visible coronary calcific plaques. There are calcifications along the aortic valve leaflets but no  aortic aneurysm, penetrating ulcers, dissection or stenosis. Thoracic aorta is slightly tortuous with mild scattered calcific plaque. The great vessels are widely patent. Small amount of calcific plaque at the brachiocephalic bifurcation is seen but no other great vessel disease. Pulmonary veins are nondistended. Pulmonary arteries are normal caliber without embolus through the segmental arteries on this nondedicated exam. Mediastinum/Nodes: No enlarged mediastinal, hilar, or axillary lymph nodes. Thyroid gland, trachea, and esophagus demonstrate no significant findings. Small hiatal hernia again noted. Single calcified right hilar node. Lungs/Pleura: Increased subsegmental atelectasis in the posterior lungs are seen as compared with yesterday's exam. Old calcified granuloma right lower lobe. Asymmetric right apical pleural-parenchymal scarring. Chronic subpleural reticulation anteriorly right upper lobe possibly XRT related. There remains no consolidation, effusion or pneumothorax. No pulmonary nodules. Chronic asymmetric elevation right diaphragm. Central airways are patent. Musculoskeletal: There were scattered metastases in the ribcage on yesterday's bone scan new from 07/28/2021, mostly in the posterior ribs but some were present in the left anterior ribs. Most of these are fairly subtle on CT. There is faint sclerosis and indistinctness of the cortex of the posterior right fourth through sixth ribs and 2 some extent of the  posterior right seventh rib, similar changes in the left posterior seventh and eighth ribs and questionably in the posteromedial left ninth rib, with loss of cortical definition consistent with metastasis involving the posterior left tenth and eleventh ribs. No discrete lesion is seen corresponding to the scattered abnormal activity in the anterior left rib cage. In the thoracic spine there is patchy sclerosis consistent with metastatic involvement newly noted since 07/28/2021 in the T1 and  T2 vertebral bodies, with mixed sclerotic/lytic involvement suspected in the T4 and T5 vertebrae, lytic involvement in the anterior T8 body, mixed involvement predominantly lytic in the T11 body and right T10 articular pillar, and lytic involvement affecting much of the T11 body. There is a chronic moderate anterior wedge compression fracture deformity of the T12 body which is unchanged, but as seen on yesterday's exam there is a mild upper plate recent anterior wedge compression fracture deformity of the T6 body, and increased central endplate depression deformities both superiorly and inferiorly T10 and T11, all mild. No paraspinal hematoma is seen. The pedicles and posterior elements are intact. There is multilevel bridging enthesopathy which is also intact. Review of the MIP images confirms the above findings. CTA ABDOMEN AND PELVIS FINDINGS VASCULAR Aorta: There is moderate patchy calcific/mixed plaque without dissection, aneurysm or stenosis. Celiac: Patent without evidence of aneurysm, dissection, vasculitis or significant stenosis. There are nonstenosing ostial calcifications at the superior vessel origin. SMA: Patent without evidence of aneurysm, dissection, vasculitis or significant stenosis. There are nonstenosing ostial calcific plaques. Renals: Both are single. On the right, there is a 75% stenosis in the proximal 5 mm of the vessel due to calcific plaques. On the left, there is a 60% stenosis in the proximal 8 mm of the vessel due to soft plaques along the superior wall. Both renal arteries are otherwise widely patent. No branch occlusion is seen. IMA: Patent without evidence of aneurysm, dissection, vasculitis or significant stenosis. Inflow: There are patchy calcifications in the common iliac and internal iliac arteries, no significant disease in the external iliac arteries and no flow-limiting inflow vessel stenosis. Veins: No obvious venous abnormality within the limitations of this arterial phase  study. Review of the MIP images confirms the above findings. NON-VASCULAR Hepatobiliary: The liver is 20 cm length, with diffuse metastatic disease developed since 07/28/2021. There are multiple rim enhancing centrally necrotic masses involving all segments. The metastases were better visualized on yesterday's exam. Example lesions include an irregular hypodense mass in segment 6 measuring 4.3 x 3.9 cm, rim enhancing mass further down in segment 6 measuring 3.3 x 2.9 cm, rim enhancing mass in segment 4A measuring 2.8 x 2.9 cm, 3.5 x 3.1 cm in segment 7, and a rim enhancing segment 3 lesion measuring 3.4 x 3.1 cm. Pancreas: Since yesterday's exam there has developed pancreatic and peripancreatic edema, relatively mild in degree consistent with acute interstitial pancreatitis. Small volume of low-density fluid tracks along side the pancreatic tail in the upper left paracolic gutter. There is fatty infiltration in the pancreas but no mass enhancement and no obvious necrosis, no ductal dilatation. No pancreatic abscess is seen. Spleen: Occasional calcified granulomas.  No other abnormality. Adrenals/Urinary Tract: There is no adrenal mass. Slight chronic nodular thickening of both adrenal glands is unaltered. There are scattered cortical scar-like defects both kidneys. There is no renal mass enhancement, stone or hydronephrosis. The bladder contains contrast. The bladder is not well seen due to spray artifact from a left hip replacement. The visualized portion of the right lateral  wall is unremarkable. Stomach/Bowel: No dilatation or wall thickening including the sub cecal appendix. There is moderate constipation ascending and transverse colon. Left-sided diverticulosis greatest in the sigmoid without acute inflammatory change. Lymphatic: Increased retroperitoneal adenopathy. This includes portacaval lymph nodes up to 1.3 cm in short axis, left preaortic nodes to 1 cm in short axis, left para-aortic chain adenopathy up  to 1.5 cm in short axis, a left common iliac chain node measuring 2.2 x 1.5 cm, with no other visible adenopathy. Reproductive: Uterus and bilateral adnexa are unremarkable. Other: In addition to the peripancreatic fluid described above there is trace fluid in the posterior deep pelvis. There is no free air, free hemorrhage or abscess. Musculoskeletal: Osteopenia and degenerative change lumbar spine. There is mixed lytic and sclerotic metastatic involvement of all 5 lumbar vertebral bodies. There is a new mild pathologic anterior wedge compression fracture of the L3 vertebral body, also new mild pathologic compression fracture deformity generalized at L4 with no change in mild chronic wedging of L5. Destructive lesions involving both iliac crests, greater on the right and not seen in 2023. Scattered sclerotic metastatic disease in the sacrum. Additional sclerotic metastasis new from 2023 in the right ischial tuberosity, with lytic disease in the right pubic bone. The bone around the right hip replacement not well seen due to spray artifact. Pathologic posterior bowing of the L4 cortex into the spinal canal causes severe spinal stenosis at L4 and this may also be present at L3. Review of the MIP images confirms the above findings. IMPRESSION: 1. Aortic atherosclerosis without evidence of aneurysm or dissection. 2. Bilateral renal artery stenoses. No other significant visceral arterial stenoses. 3. Interval development of diffuse hepatic and skeletal metastatic disease since the 07/28/2021 exam. 4. Interval development just since yesterday CT findings of acute interstitial pancreatitis with peripancreatic fluid tracking along the pancreatic tail into the upper left paracolic gutter with trace amount in the posterior pelvis. No pancreatic necrosis or ductal dilatation. 5. Increased retroperitoneal / portacaval adenopathy. 6. Increased subsegmental atelectasis in the posterior lungs. 7. New multilevel metastatic  involvement of the ribcage, spine, sacrum and bony pelvis. 8. New mild pathologic compression fractures of the T6, T10, T11, L3 and L4 vertebral bodies. No posterior element fractures are seen. 9. Pathologic posterior bowing of the L4 vertebral body into the spinal canal causes severe spinal stenosis at L4 and may also be present at L3. 10. Additional findings described above. Aortic Atherosclerosis (ICD10-I70.0). Electronically Signed   By: Almira Bar M.D.   On: 02/25/2023 22:48        Scheduled Meds:  docusate sodium  100 mg Oral BID   enoxaparin (LOVENOX) injection  40 mg Subcutaneous Q24H   levothyroxine  112 mcg Oral Q0600   lisinopril  10 mg Oral Daily   pantoprazole (PROTONIX) IV  40 mg Intravenous Q24H   Continuous Infusions:  lactated ringers 125 mL/hr at 02/17/23 0828     LOS: 1 day    Time spent: 45 minutes spent on chart review, discussion with nursing staff, consultants, updating family and interview/physical exam; more than 50% of that time was spent in counseling and/or coordination of care.    Joseph Art, DO [facility] Available via Epic secure chat 7am-7pm After these hours, please refer to coverage provider listed on [email or web address] 02/17/2023, 12:05 PM

## 2023-02-17 NOTE — Plan of Care (Signed)
Problem: Education: Goal: Knowledge of General Education information will improve Description: Including pain rating scale, medication(s)/side effects and non-pharmacologic comfort measures Outcome: Progressing   Problem: Coping: Goal: Level of anxiety will decrease Outcome: Progressing

## 2023-02-18 ENCOUNTER — Encounter (HOSPITAL_COMMUNITY): Payer: Self-pay | Admitting: Internal Medicine

## 2023-02-18 DIAGNOSIS — Z7189 Other specified counseling: Secondary | ICD-10-CM

## 2023-02-18 DIAGNOSIS — K859 Acute pancreatitis without necrosis or infection, unspecified: Secondary | ICD-10-CM | POA: Diagnosis not present

## 2023-02-18 DIAGNOSIS — I10 Essential (primary) hypertension: Secondary | ICD-10-CM | POA: Diagnosis not present

## 2023-02-18 DIAGNOSIS — Z515 Encounter for palliative care: Secondary | ICD-10-CM | POA: Diagnosis not present

## 2023-02-18 LAB — LIPASE, BLOOD
Lipase: 504 U/L — ABNORMAL HIGH (ref 11–51)
Lipase: 615 U/L — ABNORMAL HIGH (ref 11–51)

## 2023-02-18 MED ORDER — ORAL CARE MOUTH RINSE: 15.0000 mL | OROMUCOSAL | Status: DC | PRN

## 2023-02-18 MED ORDER — OXYCODONE HCL 5 MG PO TABS
5.0000 mg | ORAL_TABLET | Freq: Four times a day (QID) | ORAL | Status: DC | PRN
  Administered 2023-02-18: 5 mg via ORAL
  Filled 2023-02-18 (×2): qty 1

## 2023-02-18 MED ORDER — PHENOL 1.4 % MT LIQD: 1.0000 | OROMUCOSAL | Status: DC | PRN

## 2023-02-18 MED ORDER — BENZOCAINE 10 % MT GEL
Freq: Four times a day (QID) | OROMUCOSAL | Status: DC | PRN
  Filled 2023-02-18: qty 9.4

## 2023-02-18 NOTE — Plan of Care (Signed)
Patient hs been having a lot in pain through day.  Have managed with iv pain meds and added oxy on to orders.  Vitals stable.

## 2023-02-18 NOTE — Consult Note (Signed)
Consultation Note Date: 02/18/2023   Patient Name: Alicia Vasquez  DOB: [birth year]  MRN: [id number]  Age / Sex: 82 y.o., female  PCP: Cecille Rubin, FNP Referring Physician: Joseph Art, DO  Reason for Consultation: Establishing goals of care  HPI/Patient Profile: 82 y.o. female  with past medical history of   admitted on 02/03/2023   Clinical Assessment and Goals of Care:  Alicia Vasquez is a 82 y.o. 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    Patient was brought to ED on 02/04/2023 due to CP and HTN, as well as abdominal pain and N/V. Labs showed leukocytosis, elevated alk phos and lipase, VS showed hypertension and tachycardia, no fever. CTA C/A/P showed acute pancreatitis and possible area of necrosis, Interval development of diffuse hepatic and skeletal metastatic disease since the 07/28/2021 exam, increased retroperitoneal / portacaval adenopathy.    Patient was admitted and oncology was consulted who recommended liver lesion biopsy to check back on prognostics and send for foundation testing. Per oncology note, patient states that she wants to try more treatment if possible.    IR was requested for image guided liver lesion biopsy, case reviewed and approved for US guided liver lesion bx by Dr. Elby Showers.  Chart reviewed, patient seen and examined, call placed but unable to reach friend [name].   Palliative medicine is specialized medical care for people living with serious illness. It focuses on providing relief from the symptoms and stress of a serious illness. The goal is to improve quality of life for both the patient and the family. Goals of care: Broad aims of medical therapy in relation to the  patient's values and preferences. Our aim is to provide medical care aimed at enabling patients to achieve the goals that matter most to them, given the circumstances of their particular medical situation and their constraints.       HCPOA  Friend karen [phone].   SUMMARY OF RECOMMENDATIONS   Full Code Liver Biopsy to be done, await results so as to continue goals of care discussions, med onc also on board Patient sees my colleague Ms Cousar NP palliative at [facility] - recommend continuation of palliative care at cancer center.  Resume home Oxy IR PO PRN and monitor.  Thank you for the consult.   Code Status/Advance Care Planning: Full code   Symptom Management:     Palliative Prophylaxis:  Delirium Protocol  Additional Recommendations (Limitations, Scope, Preferences): Full Scope Treatment  Psycho-social/Spiritual:  Desire for further Chaplaincy support:yes Additional Recommendations: Caregiving  Support/Resources  Prognosis:  Unable to determine  Discharge Planning: from [facility]      Primary Diagnoses: Present on Admission:  Acute pancreatitis   I have reviewed the medical record, interviewed the patient and family, and examined the patient. The following aspects are  pertinent.  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    Hypertension    Hypothyroid    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   Personal history of radiation therapy    Pneumonia    PONV (postoperative nausea and vomiting)    Radiation 11/21/13-01/07/14   Right Breast/Supraclavicular   Social History   Socioeconomic History   Marital status: Widowed    Spouse name: Not on file   Number of children: 1   Years of education: Not on file   Highest education level: Not  on file  Occupational History   Not on file  Tobacco Use   Smoking status: Never   Smokeless tobacco: Never  Vaping Use   Vaping status: Never Used  Substance and Sexual Activity   Alcohol use: No    Alcohol/week: 0.0 standard drinks of alcohol   Drug use: No   Sexual activity: Never  Other Topics Concern   Not on file  Social History Narrative   Exercise: yard work.   Social Determinants of Health   Financial Resource Strain: High Risk (10/10/2022)   Overall Financial Resource Strain (CARDIA)    Difficulty of Paying Living Expenses: Hard  Food Insecurity: No Food Insecurity (03/10/2023)   Hunger Vital Sign    Worried About Running Out of Food in the Last Year: Never true    Ran Out of Food in the Last Year: Never true  Transportation Needs: No Transportation Needs (10-Mar-2023)   PRAPARE - Administrator, Civil Service (Medical): No    Lack of Transportation (Non-Medical): No  Recent Concern: Transportation Needs - Unmet Transportation Needs (12/12/2022)   PRAPARE - Transportation    Lack of Transportation (Medical): Yes    Lack of Transportation (Non-Medical): Yes  Physical Activity: Inactive (10/10/2022)   Exercise Vital Sign    Days of Exercise per Week: 0 days    Minutes of Exercise per Session: 0 min  Stress: Stress Concern Present (10/10/2022)   [employer] of Occupational Health - Occupational Stress Questionnaire    Feeling of Stress : Very much  Social Connections: Socially Isolated (10/10/2022)   Social Connection and Isolation Panel [NHANES]    Frequency of Communication with Friends and Family: More than three times a week    Frequency of Social Gatherings with Friends and Family: Twice a week    Attends Religious Services: Never    Database administrator or Organizations: No    Attends Banker Meetings: Never    Marital Status: Widowed   Family History  Problem Relation Age of Onset   Heart disease Brother    Colon cancer  Brother    Prostate cancer Brother    Scheduled Meds:  docusate sodium  100 mg Oral BID   enoxaparin (LOVENOX) injection  40 mg Subcutaneous Q24H   levothyroxine  112 mcg Oral Q0600   lisinopril  10 mg Oral Daily   pantoprazole (PROTONIX) IV  40 mg Intravenous Q24H   Continuous Infusions:  lactated ringers 125 mL/hr at 02/18/23 1447   PRN Meds:.acetaminophen **OR** acetaminophen, albuterol, benzocaine, bisacodyl, hydrALAZINE, HYDROmorphone (DILAUDID) injection, ondansetron **OR** ondansetron (ZOFRAN) IV, mouth rinse, oxyCODONE, phenol, traZODone Medications Prior to Admission:  Prior to Admission medications  Medication Sig Start Date End Date Taking? Authorizing Provider  acetaminophen (TYLENOL) 325 MG tablet Take 650 mg by mouth every 4 (four) hours as needed (Pain).   Yes [provider]  acetaminophen (TYLENOL) 325 MG tablet Take 650 mg by mouth every 6 (six) hours as needed (Pain).   Yes [provider]  Benzocaine-Menthol-Zinc Cl (ORAJEL 3X TOOTHACHE & GUM) 20-0.26-0.15 % GEL Use as directed 1 Application in the mouth or throat See admin instructions. 1 application by mouth every 6 hours as needed for tooth and gum pain. Apply to affected are up to 4 times a day. If symptoms persist past 7 days, follow up with dentist for further evaluation and treatment.   Yes [provider]  calcium carbonate (TUMS - DOSED IN MG ELEMENTAL CALCIUM) 500 MG chewable tablet Chew 1 tablet by mouth every 8 (eight) hours as needed for indigestion or heartburn.   Yes [provider]  cholecalciferol (VITAMIN D3) 25 MCG (1000 UNIT) tablet Take 1,000 Units by mouth daily. Current dose 5000 units   Yes [provider]  docusate sodium (COLACE) 100 MG capsule Take 100 mg by mouth daily.   Yes [provider]  levothyroxine (SYNTHROID) 112 MCG tablet Take 1 tablet (112 mcg total) by mouth daily before breakfast. 10/25/22  Yes Carlus Pavlov, MD  lisinopril  (ZESTRIL) 10 MG tablet TAKE ONE TABLET BY MOUTH ONCE DAILY 12/26/22  Yes Ardith Dark, MD  naloxone [facility]) 0.4 MG/ML injection Inject 1 mL (0.4 mg total) into the muscle as needed. 03/17/22  Yes Shalhoub, Deno Lunger, MD  ondansetron (ZOFRAN) 8 MG tablet TAKE ONE TABLET BY MOUTH EVERY 8 HOURS AS NEEDED FOR NAUSEA AND vomiting Patient taking differently: Take 1 tablet by mouth 3 (three) times daily. 01/04/23  Yes Pickenpack-Cousar, Arty Baumgartner, NP  oxyCODONE (OXY IR/ROXICODONE) 5 MG immediate release tablet Take 1 tablet (5 mg total) by mouth every 6 (six) hours as needed for severe pain. 01/04/23  Yes Pickenpack-Cousar, Arty Baumgartner, NP  pantoprazole (PROTONIX) 40 MG tablet Take 1 tablet (40 mg total) by mouth daily before breakfast. Patient taking differently: Take 40 mg by mouth at bedtime. 11/28/22  Yes Pickenpack-Cousar, Arty Baumgartner, NP  polyethylene glycol (MIRALAX / GLYCOLAX) 17 g packet Take 17 g by mouth daily as needed (Constipation).   Yes [provider]  clindamycin (CLEOCIN) 150 MG capsule Take by mouth. Patient not taking: Reported on 03/04/23 01/31/23   [provider]   Allergies  Allergen Reactions   Anesthetics, Amide Hypertension   Benadryl [Diphenhydramine Hcl] Other (See Comments)    Dizziness   Carbocaine [Mepivacaine Hcl] Hypertension   Codeine Other (See Comments)    Dizziness   Epinephrine Hypertension   Sulfa Antibiotics Other (See Comments)    dizziness   Diphenhydramine    Latex Other (See Comments)    Blisters in mouth   Macrobid [Nitrofurantoin] Nausea And Vomiting   Penicillin G    Penicillins Rash    Has patient had a PCN reaction causing immediate rash, facial/tongue/throat swelling, SOB or lightheadedness with hypotension: Unknown Has patient had a PCN reaction causing severe rash involving mucus membranes or skin necrosis: Unknown Has patient had a PCN reaction that required hospitalization: Unknown Has patient had a PCN reaction occurring within  the last 10 years: Unknown If all of the above answers are "NO", then may proceed with Cephalosporin use.    Tramadol Other (See Comments)    Sedation.    Review of  Systems Nausea earlier this am.   Physical Exam Awake alert No distress Resting in bed Regular  S 1 S 2  No edema     Vital Signs: BP (!) 144/55 (BP Location: Left Arm)   Pulse 96   Temp 97.7 F (36.5 C) (Oral)   Resp 16   Wt 86.7 kg   SpO2 96%   BMI 32.81 kg/m  Pain Scale: 0-10   Pain Score: 5    SpO2: SpO2: 96 % O2 Device:SpO2: 96 % O2 Flow Rate: .O2 Flow Rate (L/min): 2 L/min  IO: Intake/output summary:  Intake/Output Summary (Last 24 hours) at 02/18/2023 1515 Last data filed at 02/18/2023 1300 Gross per 24 hour  Intake 3239.42 ml  Output 1050 ml  Net 2189.42 ml    LBM: Last BM Date :  (PT unsure) Baseline Weight: Weight: 86.7 kg Most recent weight: Weight: 86.7 kg     Palliative Assessment/Data:   PPS 50%  Time In:  1400 Time Out:  1500 Time Total:  60  Greater than 50%  of this time was spent counseling and coordinating care related to the above assessment and plan.  Signed by: Rosalin Hawking, MD   Please contact Palliative Medicine Team phone at [phone] for questions and concerns.  For individual provider: See Loretha Stapler

## 2023-02-18 NOTE — Progress Notes (Signed)
PROGRESS NOTE    Alicia Vasquez  KXF:[id number] DOB: 02-08-1941 DOA: 03/12/2023 PCP: Cecille Rubin, FNP    Brief Narrative:  Alicia Vasquez is a 82 y.o. 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  History provided by the patient as well as her close friend and healthcare power of attorney who is at the bedside with her.  She lives in the facility, for the last 3 to 4 days, she has been having upper abdominal and chest pain, with some pain radiating to her back.  Found to have worsening metastatic disease.   Assessment and Plan: Acute pancreatitis-unclear etiology at this time -N.p.o. except for ice chips and sips with meds -IV lactated Ringer's infusion -Pain and nausea medication as needed -triglycerides: 175 -CT scan: Interval development of diffuse hepatic and skeletal metastatic disease since the 07/28/2021 exam.  Interval development just since yesterday CT findings of acute interstitial pancreatitis with peripancreatic fluid tracking along the pancreatic tail into the upper left paracolic gutter with trace amount in the posterior pelvis. No pancreatic necrosis or ductal dilatation. -Oncology requests IR consult for liver biopsy   Hypertension -Continue home lisinopril -IV hydralazine for uncontrolled hypertension   Hypothryoidism -continue Synthroid   Breast cancer -on monthly fulvestrant injections -CT scan: with worsening metastasis-- oncology to see but most likely will not be a candidate for aggressive treatment -palliative care consult       DVT prophylaxis: enoxaparin (LOVENOX) injection 40 mg Start: 02/17/23 1000 SCDs Start: 2023/03/12 2202    Code Status: Full Code Family Communication: called [name]  Disposition Plan:  Level of care: Med-Surg Status is: Inpatient     Consultants:  oncology   Subjective: C/o  pain left hip and stomach  Objective: Vitals:   02/17/23 1149 02/17/23 1321 02/17/23 2123 02/18/23 0602  BP: (!) 159/74 (!) 141/74 (!) 163/68 (!) 144/55  Pulse: 95 98 100 96  Resp: 16 16 16 16   Temp: 98.8 F (37.1 C) 98.5 F (36.9 C) 99.3 F (37.4 C) 97.7 F (36.5 C)  TempSrc: Oral Oral Oral Oral  SpO2: 98% 97% 98% 96%  Weight:        Intake/Output Summary (Last 24 hours) at 02/18/2023 1009 Last data filed at 02/18/2023 6967 Gross per 24 hour  Intake 2425.56 ml  Output 750 ml  Net 1675.56 ml   Filed Weights   03/12/2023 2327  Weight: 86.7 kg    Examination:   General: Appearance:    Obese female in no acute distress     Lungs:     respirations unlabored  Heart:    Normal heart rate. .    MS:   All extremities are intact.    Neurologic:   Awake, alert       Data Reviewed: I have personally reviewed following labs and imaging studies  CBC: Recent Labs  Lab Mar 12, 2023 1840 02/17/23 0012  WBC 17.7* 17.6*  HGB 12.6 11.6*  HCT 39.9 36.8  MCV 89.1 89.3  PLT 384 335   Basic Metabolic Panel: Recent Labs  Lab 2023-03-12 1840 02/17/23 0012  NA 137 135  K 4.1 3.8  CL 102 103  CO2 23 23  GLUCOSE 120* 95  BUN 17 16  CREATININE 0.76 0.83  CALCIUM 11.0* 10.4*   GFR: Estimated Creatinine Clearance: 54.7 mL/min (by C-G formula based on SCr of 0.83 mg/dL). Liver Function Tests: Recent Labs  Lab 02/23/23 1840 02/17/23 0012  AST 35 32  ALT 17 17  ALKPHOS 219* 201*  BILITOT 1.0 0.9  PROT 7.3 6.3*  ALBUMIN 3.1* 2.8*   Recent Labs  Lab 2023/02/23 1840 02/17/23 0012  LIPASE 615* 504*   No results for input(s): "AMMONIA" in the last 168 hours. Coagulation Profile: No results for input(s): "INR", "PROTIME" in the last 168 hours. Cardiac Enzymes: No results for input(s): "CKTOTAL", "CKMB", "CKMBINDEX", "TROPONINI" in the last 168 hours. BNP (last 3 results) No results for input(s): "PROBNP" in the last 8760 hours. HbA1C: No results for input(s): "HGBA1C"  in the last 72 hours. CBG: No results for input(s): "GLUCAP" in the last 168 hours. Lipid Profile: Recent Labs    2023/02/23 1840  TRIG 175*   Thyroid Function Tests: No results for input(s): "TSH", "T4TOTAL", "FREET4", "T3FREE", "THYROIDAB" in the last 72 hours. Anemia Panel: No results for input(s): "VITAMINB12", "FOLATE", "FERRITIN", "TIBC", "IRON", "RETICCTPCT" in the last 72 hours. Sepsis Labs: No results for input(s): "PROCALCITON", "LATICACIDVEN" in the last 168 hours.  No results found for this or any previous visit (from the past 240 hour(s)).       Radiology Studies: CT Angio Chest/Abd/Pel for Dissection W and/or Wo Contrast  Result Date: February 23, 2023 CLINICAL DATA:  Stage IV breast cancer, severe back pain, abdominal pain and severe hypertension on presentation. Evaluate for aortic dissection. Patient suffered a fall injury 02/14/2023 and could also have a spinal fracture. EXAM: CT ANGIOGRAPHY CHEST, ABDOMEN AND PELVIS TECHNIQUE: Initial chest CT without contrast was performed to the adrenal glands to evaluate for aortic hematoma. Multidetector CT imaging through the chest, abdomen and pelvis was performed using the standard protocol during bolus administration of intravenous contrast. Multiplanar reconstructed images and MIPs were obtained and reviewed to evaluate the vascular anatomy. RADIATION DOSE REDUCTION: This exam was performed according to the departmental dose-optimization program which includes automated exposure control, adjustment of the mA and/or kV according to patient size and/or use of iterative reconstruction technique. CONTRAST:  OMNIPAQUE IOHEXOL 350 MG/ML SOLN COMPARISON:  Cancer restaging chest, abdomen and pelvis CT with IV contrast yesterday 02/15/2023, CT chest, abdomen and pelvis with IV contrast 07/28/2021. FINDINGS: CTA CHEST FINDINGS Cardiovascular: The cardiac size is normal. Chronic calcification again noted in the left inferolateral marker ring.  There is no pericardial effusion no visible coronary calcific plaques. There are calcifications along the aortic valve leaflets but no aortic aneurysm, penetrating ulcers, dissection or stenosis. Thoracic aorta is slightly tortuous with mild scattered calcific plaque. The great vessels are widely patent. Small amount of calcific plaque at the brachiocephalic bifurcation is seen but no other great vessel disease. Pulmonary veins are nondistended. Pulmonary arteries are normal caliber without embolus through the segmental arteries on this nondedicated exam. Mediastinum/Nodes: No enlarged mediastinal, hilar, or axillary lymph nodes. Thyroid gland, trachea, and esophagus demonstrate no significant findings. Small hiatal hernia again noted. Single calcified right hilar node. Lungs/Pleura: Increased subsegmental atelectasis in the posterior lungs are seen as compared with yesterday's exam. Old calcified granuloma right lower lobe. Asymmetric right apical pleural-parenchymal scarring. Chronic subpleural reticulation anteriorly right upper lobe possibly XRT related. There remains no consolidation, effusion or pneumothorax. No pulmonary nodules. Chronic asymmetric elevation right diaphragm. Central airways are patent. Musculoskeletal: There were scattered metastases in the ribcage on yesterday's bone scan new from 07/28/2021, mostly in the posterior ribs but some were present in the left anterior ribs. Most of these are fairly subtle on CT. There is faint sclerosis and  indistinctness of the cortex of the posterior right fourth through sixth ribs and 2 some extent of the posterior right seventh rib, similar changes in the left posterior seventh and eighth ribs and questionably in the posteromedial left ninth rib, with loss of cortical definition consistent with metastasis involving the posterior left tenth and eleventh ribs. No discrete lesion is seen corresponding to the scattered abnormal activity in the anterior left rib  cage. In the thoracic spine there is patchy sclerosis consistent with metastatic involvement newly noted since 07/28/2021 in the T1 and T2 vertebral bodies, with mixed sclerotic/lytic involvement suspected in the T4 and T5 vertebrae, lytic involvement in the anterior T8 body, mixed involvement predominantly lytic in the T11 body and right T10 articular pillar, and lytic involvement affecting much of the T11 body. There is a chronic moderate anterior wedge compression fracture deformity of the T12 body which is unchanged, but as seen on yesterday's exam there is a mild upper plate recent anterior wedge compression fracture deformity of the T6 body, and increased central endplate depression deformities both superiorly and inferiorly T10 and T11, all mild. No paraspinal hematoma is seen. The pedicles and posterior elements are intact. There is multilevel bridging enthesopathy which is also intact. Review of the MIP images confirms the above findings. CTA ABDOMEN AND PELVIS FINDINGS VASCULAR Aorta: There is moderate patchy calcific/mixed plaque without dissection, aneurysm or stenosis. Celiac: Patent without evidence of aneurysm, dissection, vasculitis or significant stenosis. There are nonstenosing ostial calcifications at the superior vessel origin. SMA: Patent without evidence of aneurysm, dissection, vasculitis or significant stenosis. There are nonstenosing ostial calcific plaques. Renals: Both are single. On the right, there is a 75% stenosis in the proximal 5 mm of the vessel due to calcific plaques. On the left, there is a 60% stenosis in the proximal 8 mm of the vessel due to soft plaques along the superior wall. Both renal arteries are otherwise widely patent. No branch occlusion is seen. IMA: Patent without evidence of aneurysm, dissection, vasculitis or significant stenosis. Inflow: There are patchy calcifications in the common iliac and internal iliac arteries, no significant disease in the external iliac  arteries and no flow-limiting inflow vessel stenosis. Veins: No obvious venous abnormality within the limitations of this arterial phase study. Review of the MIP images confirms the above findings. NON-VASCULAR Hepatobiliary: The liver is 20 cm length, with diffuse metastatic disease developed since 07/28/2021. There are multiple rim enhancing centrally necrotic masses involving all segments. The metastases were better visualized on yesterday's exam. Example lesions include an irregular hypodense mass in segment 6 measuring 4.3 x 3.9 cm, rim enhancing mass further down in segment 6 measuring 3.3 x 2.9 cm, rim enhancing mass in segment 4A measuring 2.8 x 2.9 cm, 3.5 x 3.1 cm in segment 7, and a rim enhancing segment 3 lesion measuring 3.4 x 3.1 cm. Pancreas: Since yesterday's exam there has developed pancreatic and peripancreatic edema, relatively mild in degree consistent with acute interstitial pancreatitis. Small volume of low-density fluid tracks along side the pancreatic tail in the upper left paracolic gutter. There is fatty infiltration in the pancreas but no mass enhancement and no obvious necrosis, no ductal dilatation. No pancreatic abscess is seen. Spleen: Occasional calcified granulomas.  No other abnormality. Adrenals/Urinary Tract: There is no adrenal mass. Slight chronic nodular thickening of both adrenal glands is unaltered. There are scattered cortical scar-like defects both kidneys. There is no renal mass enhancement, stone or hydronephrosis. The bladder contains contrast. The bladder is not  well seen due to spray artifact from a left hip replacement. The visualized portion of the right lateral wall is unremarkable. Stomach/Bowel: No dilatation or wall thickening including the sub cecal appendix. There is moderate constipation ascending and transverse colon. Left-sided diverticulosis greatest in the sigmoid without acute inflammatory change. Lymphatic: Increased retroperitoneal adenopathy. This  includes portacaval lymph nodes up to 1.3 cm in short axis, left preaortic nodes to 1 cm in short axis, left para-aortic chain adenopathy up to 1.5 cm in short axis, a left common iliac chain node measuring 2.2 x 1.5 cm, with no other visible adenopathy. Reproductive: Uterus and bilateral adnexa are unremarkable. Other: In addition to the peripancreatic fluid described above there is trace fluid in the posterior deep pelvis. There is no free air, free hemorrhage or abscess. Musculoskeletal: Osteopenia and degenerative change lumbar spine. There is mixed lytic and sclerotic metastatic involvement of all 5 lumbar vertebral bodies. There is a new mild pathologic anterior wedge compression fracture of the L3 vertebral body, also new mild pathologic compression fracture deformity generalized at L4 with no change in mild chronic wedging of L5. Destructive lesions involving both iliac crests, greater on the right and not seen in 2023. Scattered sclerotic metastatic disease in the sacrum. Additional sclerotic metastasis new from 2023 in the right ischial tuberosity, with lytic disease in the right pubic bone. The bone around the right hip replacement not well seen due to spray artifact. Pathologic posterior bowing of the L4 cortex into the spinal canal causes severe spinal stenosis at L4 and this may also be present at L3. Review of the MIP images confirms the above findings. IMPRESSION: 1. Aortic atherosclerosis without evidence of aneurysm or dissection. 2. Bilateral renal artery stenoses. No other significant visceral arterial stenoses. 3. Interval development of diffuse hepatic and skeletal metastatic disease since the 07/28/2021 exam. 4. Interval development just since yesterday CT findings of acute interstitial pancreatitis with peripancreatic fluid tracking along the pancreatic tail into the upper left paracolic gutter with trace amount in the posterior pelvis. No pancreatic necrosis or ductal dilatation. 5.  Increased retroperitoneal / portacaval adenopathy. 6. Increased subsegmental atelectasis in the posterior lungs. 7. New multilevel metastatic involvement of the ribcage, spine, sacrum and bony pelvis. 8. New mild pathologic compression fractures of the T6, T10, T11, L3 and L4 vertebral bodies. No posterior element fractures are seen. 9. Pathologic posterior bowing of the L4 vertebral body into the spinal canal causes severe spinal stenosis at L4 and may also be present at L3. 10. Additional findings described above. Aortic Atherosclerosis (ICD10-I70.0). Electronically Signed   By: Almira Bar M.D.   On: Mar 14, 2023 22:48        Scheduled Meds:  docusate sodium  100 mg Oral BID   enoxaparin (LOVENOX) injection  40 mg Subcutaneous Q24H   levothyroxine  112 mcg Oral Q0600   lisinopril  10 mg Oral Daily   pantoprazole (PROTONIX) IV  40 mg Intravenous Q24H   Continuous Infusions:  lactated ringers 125 mL/hr at 02/18/23 0400     LOS: 2 days    Time spent: 45 minutes spent on chart review, discussion with nursing staff, consultants, updating family and interview/physical exam; more than 50% of that time was spent in counseling and/or coordination of care.    Joseph Art, DO [facility] Available via Epic secure chat 7am-7pm After these hours, please refer to coverage provider listed on [email or web address] 02/18/2023, 10:09 AM

## 2023-02-18 NOTE — Consult Note (Signed)
Chief Complaint: Patient was seen in consultation today for liver lesion bx  Chief Complaint  Patient presents with   Hypertension   Abdominal Pain   at the request of Benjamine Mola, Jessica/ Rachel Moulds   Referring Physician(s): Elayne Snare   Supervising Physician: Marliss Coots  Patient Status: [facility] - In-pt  History of Present Illness: Alicia Vasquez is a 82 y.o. 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   Patient was brought to ED on 9/19 due to CP and HTN, as well as abdominal pain and N/V. Labs showed leukocytosis, elevated alk phos and lipase, VS showed hypertension and tachycardia, no fever. CTA C/A/P showed acute pancreatitis and possible area of necrosis, Interval development of diffuse hepatic and skeletal metastatic disease since the 07/28/2021 exam, increased retroperitoneal / portacaval adenopathy.   Patient was admitted and oncology was consulted who recommended liver lesion biopsy to check back on prognostics and send for foundation testing. Per oncology note, patient states that she wants to try more treatment if possible.   IR was requested for image guided liver lesion biopsy, case reviewed and approved for US guided liver lesion bx by Dr. Elby Showers.   Patient laying in bed, not in acute distress.  Reports abdominal pain occurs with BM was feeling nauseous this morning.  Denise headache, fever, chills, shortness of breath, cough, chest pain, vomiting, and bleeding.  Patient is able to follow command and answer questions appropriately most of the time, but easily distracted and fixed on wanting to see her friend [name].   The risks and benefit of liver lesion bx was discussed with Alicia Vasquez who if healthcare POA of the patient, informed consent obtained.     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    Hypertension    Hypothyroid    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   Personal history of radiation therapy    Pneumonia    PONV (postoperative nausea and vomiting)    Radiation 11/21/13-01/07/14   Right Breast/Supraclavicular    Past Surgical History:  Procedure Laterality Date   BREAST LUMPECTOMY Left 2015   BREAST LUMPECTOMY WITH RADIOACTIVE SEED LOCALIZATION Right 09/09/2013   Procedure: BREAST LUMPECTOMY WITH RADIOACTIVE SEED LOCALIZATION WITH AXILLARY NODE EXCISION;  Surgeon: Emelia Loron, MD;  Location: [facility];  Service: General;  Laterality: Right;   CYSTOSCOPY W/ URETERAL STENT PLACEMENT Right 01/07/2021   Procedure: CYSTOSCOPY WITH RETROGRADE PYELOGRAM/URETERAL STENT PLACEMENT;  Surgeon: Alfredo Martinez, MD;  Location: [facility];  Service: Urology;  Laterality: Right;   CYSTOSCOPY/URETEROSCOPY/HOLMIUM LASER/STENT PLACEMENT Right 02/08/2021   Procedure: CYSTOSCOPY, RIGHT URETEROSCOPY, RIGHT RETRGRADE PYELOGRAM, HOLMIUM LASER/STENT PLACEMENT;  Surgeon: Crista Elliot, MD;  Location: [facility];  Service: Urology;  Laterality: Right;   DENTAL SURGERY  04/19/2012   13 TEETH REMOVED   DILATION AND CURETTAGE OF UTERUS     IR RADIOLOGIST EVAL & MGMT  01/21/2021   ORIF PERIPROSTHETIC FRACTURE Left 01/31/2017   Procedure: REVISION and OPEN REDUCTION INTERNAL FIXATION (ORIF) PERIPROSTHETIC FRACTURE LEFT HIP;  Surgeon: Durene Romans,  MD;  Location: [facility];  Service: Orthopedics;  Laterality: Left;  120 mins   RE-EXCISION OF BREAST LUMPECTOMY Right 09/24/2013   Procedure: RE-EXCISION OF RIGHT BREAST LUMPECTOMY;  Surgeon: Emelia Loron, MD;  Location: [facility];  Service: General;  Laterality: Right;    TOTAL HIP ARTHROPLASTY Left 01/16/2017   Procedure: TOTAL HIP ARTHROPLASTY POSTERIOR;  Surgeon: Durene Romans, MD;  Location: [facility];  Service: Orthopedics;  Laterality: Left;    Allergies: Anesthetics, amide; Benadryl [diphenhydramine hcl]; Carbocaine [mepivacaine hcl]; Codeine; Epinephrine; Sulfa antibiotics; Diphenhydramine; Latex; Macrobid [nitrofurantoin]; Penicillin g; Penicillins; and Tramadol  Medications: Prior to Admission medications   Medication Sig Start Date End Date Taking? Authorizing Provider  acetaminophen (TYLENOL) 325 MG tablet Take 650 mg by mouth every 4 (four) hours as needed (Pain).   Yes [provider]  acetaminophen (TYLENOL) 325 MG tablet Take 650 mg by mouth every 6 (six) hours as needed (Pain).   Yes [provider]  Benzocaine-Menthol-Zinc Cl (ORAJEL 3X TOOTHACHE & GUM) 20-0.26-0.15 % GEL Use as directed 1 Application in the mouth or throat See admin instructions. 1 application by mouth every 6 hours as needed for tooth and gum pain. Apply to affected are up to 4 times a day. If symptoms persist past 7 days, follow up with dentist for further evaluation and treatment.   Yes [provider]  calcium carbonate (TUMS - DOSED IN MG ELEMENTAL CALCIUM) 500 MG chewable tablet Chew 1 tablet by mouth every 8 (eight) hours as needed for indigestion or heartburn.   Yes [provider]  cholecalciferol (VITAMIN D3) 25 MCG (1000 UNIT) tablet Take 1,000 Units by mouth daily. Current dose 5000 units   Yes [provider]  docusate sodium (COLACE) 100 MG capsule Take 100 mg by mouth daily.   Yes [provider]  levothyroxine (SYNTHROID) 112 MCG tablet Take 1 tablet (112 mcg total) by mouth daily before breakfast. 10/25/22  Yes Carlus Pavlov, MD  lisinopril (ZESTRIL) 10 MG tablet TAKE ONE TABLET BY MOUTH ONCE DAILY 12/26/22  Yes Ardith Dark, MD  naloxone [facility]) 0.4 MG/ML injection Inject 1 mL (0.4 mg total) into the muscle  as needed. 03/17/22  Yes Shalhoub, Deno Lunger, MD  ondansetron (ZOFRAN) 8 MG tablet TAKE ONE TABLET BY MOUTH EVERY 8 HOURS AS NEEDED FOR NAUSEA AND vomiting Patient taking differently: Take 1 tablet by mouth 3 (three) times daily. 01/04/23  Yes Pickenpack-Cousar, Arty Baumgartner, NP  oxyCODONE (OXY IR/ROXICODONE) 5 MG immediate release tablet Take 1 tablet (5 mg total) by mouth every 6 (six) hours as needed for severe pain. 01/04/23  Yes Pickenpack-Cousar, Arty Baumgartner, NP  pantoprazole (PROTONIX) 40 MG tablet Take 1 tablet (40 mg total) by mouth daily before breakfast. Patient taking differently: Take 40 mg by mouth at bedtime. 11/28/22  Yes Pickenpack-Cousar, Arty Baumgartner, NP  polyethylene glycol (MIRALAX / GLYCOLAX) 17 g packet Take 17 g by mouth daily as needed (Constipation).   Yes [provider]  clindamycin (CLEOCIN) 150 MG capsule Take by mouth. Patient not taking: Reported on Feb 18, 2023 01/31/23   [provider]     Family History  Problem Relation Age of Onset   Heart disease Brother    Colon cancer Brother    Prostate cancer Brother     Social History   Socioeconomic History   Marital status: Widowed    Spouse name: Not on file   Number of children: 1   Years of education: Not on file  Highest education level: Not on file  Occupational History   Not on file  Tobacco Use   Smoking status: Never   Smokeless tobacco: Never  Vaping Use   Vaping status: Never Used  Substance and Sexual Activity   Alcohol use: No    Alcohol/week: 0.0 standard drinks of alcohol   Drug use: No   Sexual activity: Never  Other Topics Concern   Not on file  Social History Narrative   Exercise: yard work.   Social Determinants of Health   Financial Resource Strain: High Risk (10/10/2022)   Overall Financial Resource Strain (CARDIA)    Difficulty of Paying Living Expenses: Hard  Food Insecurity: No Food Insecurity (01/30/2023)   Hunger Vital Sign    Worried About Running Out of Food in the  Last Year: Never true    Ran Out of Food in the Last Year: Never true  Transportation Needs: No Transportation Needs (02/26/2023)   PRAPARE - Administrator, Civil Service (Medical): No    Lack of Transportation (Non-Medical): No  Recent Concern: Transportation Needs - Unmet Transportation Needs (12/12/2022)   PRAPARE - Transportation    Lack of Transportation (Medical): Yes    Lack of Transportation (Non-Medical): Yes  Physical Activity: Inactive (10/10/2022)   Exercise Vital Sign    Days of Exercise per Week: 0 days    Minutes of Exercise per Session: 0 min  Stress: Stress Concern Present (10/10/2022)   [employer] of Occupational Health - Occupational Stress Questionnaire    Feeling of Stress : Very much  Social Connections: Socially Isolated (10/10/2022)   Social Connection and Isolation Panel [NHANES]    Frequency of Communication with Friends and Family: More than three times a week    Frequency of Social Gatherings with Friends and Family: Twice a week    Attends Religious Services: Never    Database administrator or Organizations: No    Attends Banker Meetings: Never    Marital Status: Widowed     Review of Systems: A 12 point ROS discussed and pertinent positives are indicated in the HPI above.  All other systems are negative.  Vital Signs: BP (!) 144/55 (BP Location: Left Arm)   Pulse 96   Temp 97.7 F (36.5 C) (Oral)   Resp 16   Wt 191 lb 2.2 oz (86.7 kg)   SpO2 96%   BMI 32.81 kg/m    Physical Exam Vitals reviewed.  Constitutional:      General: She is not in acute distress.    Appearance: She is ill-appearing.  HENT:     Head: Normocephalic and atraumatic.     Mouth/Throat:     Mouth: Mucous membranes are moist.     Pharynx: Oropharynx is clear.  Cardiovascular:     Rate and Rhythm: Normal rate and regular rhythm.  Pulmonary:     Effort: Pulmonary effort is normal.     Breath sounds: Normal breath sounds.  Abdominal:      General: Abdomen is flat. Bowel sounds are normal.  Skin:    General: Skin is warm and dry.     Coloration: Skin is not jaundiced.  Neurological:     Mental Status: She is alert.  Psychiatric:        Mood and Affect: Mood normal.        Behavior: Behavior normal.     MD Evaluation Airway: WNL Heart: WNL Abdomen: WNL Chest/ Lungs: WNL ASA  Classification: 3 Mallampati/Airway  Score: Two  Imaging: CT Angio Chest/Abd/Pel for Dissection W and/or Wo Contrast  Result Date: Feb 20, 2023 CLINICAL DATA:  Stage IV breast cancer, severe back pain, abdominal pain and severe hypertension on presentation. Evaluate for aortic dissection. Patient suffered a fall injury 02/14/2023 and could also have a spinal fracture. EXAM: CT ANGIOGRAPHY CHEST, ABDOMEN AND PELVIS TECHNIQUE: Initial chest CT without contrast was performed to the adrenal glands to evaluate for aortic hematoma. Multidetector CT imaging through the chest, abdomen and pelvis was performed using the standard protocol during bolus administration of intravenous contrast. Multiplanar reconstructed images and MIPs were obtained and reviewed to evaluate the vascular anatomy. RADIATION DOSE REDUCTION: This exam was performed according to the departmental dose-optimization program which includes automated exposure control, adjustment of the mA and/or kV according to patient size and/or use of iterative reconstruction technique. CONTRAST:  OMNIPAQUE IOHEXOL 350 MG/ML SOLN COMPARISON:  Cancer restaging chest, abdomen and pelvis CT with IV contrast yesterday 02/15/2023, CT chest, abdomen and pelvis with IV contrast 07/28/2021. FINDINGS: CTA CHEST FINDINGS Cardiovascular: The cardiac size is normal. Chronic calcification again noted in the left inferolateral marker ring. There is no pericardial effusion no visible coronary calcific plaques. There are calcifications along the aortic valve leaflets but no aortic aneurysm, penetrating ulcers, dissection or  stenosis. Thoracic aorta is slightly tortuous with mild scattered calcific plaque. The great vessels are widely patent. Small amount of calcific plaque at the brachiocephalic bifurcation is seen but no other great vessel disease. Pulmonary veins are nondistended. Pulmonary arteries are normal caliber without embolus through the segmental arteries on this nondedicated exam. Mediastinum/Nodes: No enlarged mediastinal, hilar, or axillary lymph nodes. Thyroid gland, trachea, and esophagus demonstrate no significant findings. Small hiatal hernia again noted. Single calcified right hilar node. Lungs/Pleura: Increased subsegmental atelectasis in the posterior lungs are seen as compared with yesterday's exam. Old calcified granuloma right lower lobe. Asymmetric right apical pleural-parenchymal scarring. Chronic subpleural reticulation anteriorly right upper lobe possibly XRT related. There remains no consolidation, effusion or pneumothorax. No pulmonary nodules. Chronic asymmetric elevation right diaphragm. Central airways are patent. Musculoskeletal: There were scattered metastases in the ribcage on yesterday's bone scan new from 07/28/2021, mostly in the posterior ribs but some were present in the left anterior ribs. Most of these are fairly subtle on CT. There is faint sclerosis and indistinctness of the cortex of the posterior right fourth through sixth ribs and 2 some extent of the posterior right seventh rib, similar changes in the left posterior seventh and eighth ribs and questionably in the posteromedial left ninth rib, with loss of cortical definition consistent with metastasis involving the posterior left tenth and eleventh ribs. No discrete lesion is seen corresponding to the scattered abnormal activity in the anterior left rib cage. In the thoracic spine there is patchy sclerosis consistent with metastatic involvement newly noted since 07/28/2021 in the T1 and T2 vertebral bodies, with mixed sclerotic/lytic  involvement suspected in the T4 and T5 vertebrae, lytic involvement in the anterior T8 body, mixed involvement predominantly lytic in the T11 body and right T10 articular pillar, and lytic involvement affecting much of the T11 body. There is a chronic moderate anterior wedge compression fracture deformity of the T12 body which is unchanged, but as seen on yesterday's exam there is a mild upper plate recent anterior wedge compression fracture deformity of the T6 body, and increased central endplate depression deformities both superiorly and inferiorly T10 and T11, all mild. No paraspinal hematoma is seen. The pedicles and posterior  elements are intact. There is multilevel bridging enthesopathy which is also intact. Review of the MIP images confirms the above findings. CTA ABDOMEN AND PELVIS FINDINGS VASCULAR Aorta: There is moderate patchy calcific/mixed plaque without dissection, aneurysm or stenosis. Celiac: Patent without evidence of aneurysm, dissection, vasculitis or significant stenosis. There are nonstenosing ostial calcifications at the superior vessel origin. SMA: Patent without evidence of aneurysm, dissection, vasculitis or significant stenosis. There are nonstenosing ostial calcific plaques. Renals: Both are single. On the right, there is a 75% stenosis in the proximal 5 mm of the vessel due to calcific plaques. On the left, there is a 60% stenosis in the proximal 8 mm of the vessel due to soft plaques along the superior wall. Both renal arteries are otherwise widely patent. No branch occlusion is seen. IMA: Patent without evidence of aneurysm, dissection, vasculitis or significant stenosis. Inflow: There are patchy calcifications in the common iliac and internal iliac arteries, no significant disease in the external iliac arteries and no flow-limiting inflow vessel stenosis. Veins: No obvious venous abnormality within the limitations of this arterial phase study. Review of the MIP images confirms the  above findings. NON-VASCULAR Hepatobiliary: The liver is 20 cm length, with diffuse metastatic disease developed since 07/28/2021. There are multiple rim enhancing centrally necrotic masses involving all segments. The metastases were better visualized on yesterday's exam. Example lesions include an irregular hypodense mass in segment 6 measuring 4.3 x 3.9 cm, rim enhancing mass further down in segment 6 measuring 3.3 x 2.9 cm, rim enhancing mass in segment 4A measuring 2.8 x 2.9 cm, 3.5 x 3.1 cm in segment 7, and a rim enhancing segment 3 lesion measuring 3.4 x 3.1 cm. Pancreas: Since yesterday's exam there has developed pancreatic and peripancreatic edema, relatively mild in degree consistent with acute interstitial pancreatitis. Small volume of low-density fluid tracks along side the pancreatic tail in the upper left paracolic gutter. There is fatty infiltration in the pancreas but no mass enhancement and no obvious necrosis, no ductal dilatation. No pancreatic abscess is seen. Spleen: Occasional calcified granulomas.  No other abnormality. Adrenals/Urinary Tract: There is no adrenal mass. Slight chronic nodular thickening of both adrenal glands is unaltered. There are scattered cortical scar-like defects both kidneys. There is no renal mass enhancement, stone or hydronephrosis. The bladder contains contrast. The bladder is not well seen due to spray artifact from a left hip replacement. The visualized portion of the right lateral wall is unremarkable. Stomach/Bowel: No dilatation or wall thickening including the sub cecal appendix. There is moderate constipation ascending and transverse colon. Left-sided diverticulosis greatest in the sigmoid without acute inflammatory change. Lymphatic: Increased retroperitoneal adenopathy. This includes portacaval lymph nodes up to 1.3 cm in short axis, left preaortic nodes to 1 cm in short axis, left para-aortic chain adenopathy up to 1.5 cm in short axis, a left common iliac  chain node measuring 2.2 x 1.5 cm, with no other visible adenopathy. Reproductive: Uterus and bilateral adnexa are unremarkable. Other: In addition to the peripancreatic fluid described above there is trace fluid in the posterior deep pelvis. There is no free air, free hemorrhage or abscess. Musculoskeletal: Osteopenia and degenerative change lumbar spine. There is mixed lytic and sclerotic metastatic involvement of all 5 lumbar vertebral bodies. There is a new mild pathologic anterior wedge compression fracture of the L3 vertebral body, also new mild pathologic compression fracture deformity generalized at L4 with no change in mild chronic wedging of L5. Destructive lesions involving both iliac crests, greater on  the right and not seen in 2023. Scattered sclerotic metastatic disease in the sacrum. Additional sclerotic metastasis new from 2023 in the right ischial tuberosity, with lytic disease in the right pubic bone. The bone around the right hip replacement not well seen due to spray artifact. Pathologic posterior bowing of the L4 cortex into the spinal canal causes severe spinal stenosis at L4 and this may also be present at L3. Review of the MIP images confirms the above findings. IMPRESSION: 1. Aortic atherosclerosis without evidence of aneurysm or dissection. 2. Bilateral renal artery stenoses. No other significant visceral arterial stenoses. 3. Interval development of diffuse hepatic and skeletal metastatic disease since the 07/28/2021 exam. 4. Interval development just since yesterday CT findings of acute interstitial pancreatitis with peripancreatic fluid tracking along the pancreatic tail into the upper left paracolic gutter with trace amount in the posterior pelvis. No pancreatic necrosis or ductal dilatation. 5. Increased retroperitoneal / portacaval adenopathy. 6. Increased subsegmental atelectasis in the posterior lungs. 7. New multilevel metastatic involvement of the ribcage, spine, sacrum and bony  pelvis. 8. New mild pathologic compression fractures of the T6, T10, T11, L3 and L4 vertebral bodies. No posterior element fractures are seen. 9. Pathologic posterior bowing of the L4 vertebral body into the spinal canal causes severe spinal stenosis at L4 and may also be present at L3. 10. Additional findings described above. Aortic Atherosclerosis (ICD10-I70.0). Electronically Signed   By: Almira Bar M.D.   On: 02/06/2023 22:48    Labs:  CBC: Recent Labs    11/15/22 1411 01/20/23 1133 01/30/2023 1840 02/17/23 0012  WBC 7.8 9.1 17.7* 17.6*  HGB 11.5* 12.3 12.6 11.6*  HCT 36.6 36.9 39.9 36.8  PLT 318 294 384 335    COAGS: Recent Labs    06/28/22 1223 07/26/22 1351 09/23/22 1320 10/03/22 1443  INR 1.4* 1.2 1.1 1.3*    BMP: Recent Labs    11/15/22 1411 01/20/23 1133 02/07/2023 1840 02/17/23 0012  NA 138 138 137 135  K 3.7 4.4 4.1 3.8  CL 106 103 102 103  CO2 26 28 23 23   GLUCOSE 120* 104* 120* 95  BUN 16 11 17 16   CALCIUM 10.7* 11.1* 11.0* 10.4*  CREATININE 0.94 1.00 0.76 0.83  GFRNONAA >60 56* >60 >60    LIVER FUNCTION TESTS: Recent Labs    11/15/22 1411 01/20/23 1133 02/23/2023 1840 02/17/23 0012  BILITOT 0.7 0.5 1.0 0.9  AST 21 29 35 32  ALT 20 27 17 17   ALKPHOS 109 194* 219* 201*  PROT 6.8 7.1 7.3 6.3*  ALBUMIN 3.3* 3.8 3.1* 2.8*    TUMOR MARKERS: No results for input(s): "AFPTM", "CEA", "CA199", "CHROMGRNA" in the last 8760 hours.  Assessment and Plan: 82 y.o. female with stage IV breast CA mets to bone s/p lumpectomy and chemoradiation now with new hepatic lesions, she is in need of liver lesion bx.   VSS CBC yesterday with WBC 17.6; hgb 11.6, plt 335  INR 1.3 on 10/03/22, will update  On Lovenox 40 mg every day 12 allergies, not allergic to lidocaine, Versed, Fentanyl   Risks and benefits of liver lesion bx was discussed with the patient and/or patient's family including, but not limited to bleeding, infection, damage to adjacent structures or  low yield requiring additional tests.  All of the questions were answered and there is agreement to proceed.  Consent signed and in chart.  The procedure is tentatively scheduled for Monday pending IR schedule.   Plan  - NPO  except meds  Monday MN - Lovenox MAR held - INR Sunday AM, ideally needs be to equal or less than 1.5    Thank you for this interesting consult.  I greatly enjoyed meeting Alicia Vasquez and look forward to participating in their care.  A copy of this report was sent to the requesting provider on this date.  Electronically Signed: Willette Brace, PA-C 02/18/2023, 1:07 PM   I spent a total of 40 Minutes    in face to face in clinical consultation, greater than 50% of which was counseling/coordinating care for liver lesion bx.   This chart was dictated using voice recognition software.  Despite best efforts to proofread,  errors can occur which can change the documentation meaning.

## 2023-02-19 DIAGNOSIS — K859 Acute pancreatitis without necrosis or infection, unspecified: Secondary | ICD-10-CM | POA: Diagnosis not present

## 2023-02-19 DIAGNOSIS — Z7189 Other specified counseling: Secondary | ICD-10-CM | POA: Diagnosis not present

## 2023-02-19 DIAGNOSIS — Z515 Encounter for palliative care: Secondary | ICD-10-CM

## 2023-02-19 LAB — CBC
HCT: 34 % — ABNORMAL LOW (ref 36.0–46.0)
Hemoglobin: 10.2 g/dL — ABNORMAL LOW (ref 12.0–15.0)
MCH: 28.3 pg (ref 26.0–34.0)
MCHC: 30 g/dL (ref 30.0–36.0)
MCV: 94.4 fL (ref 80.0–100.0)
Platelets: 252 10*3/uL (ref 150–400)
RBC: 3.6 MIL/uL — ABNORMAL LOW (ref 3.87–5.11)
RDW: 16.7 % — ABNORMAL HIGH (ref 11.5–15.5)
WBC: 16.4 10*3/uL — ABNORMAL HIGH (ref 4.0–10.5)
nRBC: 0 % (ref 0.0–0.2)

## 2023-02-19 LAB — COMPREHENSIVE METABOLIC PANEL
ALT: 14 U/L (ref 0–44)
AST: 31 U/L (ref 15–41)
Albumin: 2.2 g/dL — ABNORMAL LOW (ref 3.5–5.0)
Alkaline Phosphatase: 152 U/L — ABNORMAL HIGH (ref 38–126)
Anion gap: 10 (ref 5–15)
BUN: 15 mg/dL (ref 8–23)
CO2: 27 mmol/L (ref 22–32)
Calcium: 10.4 mg/dL — ABNORMAL HIGH (ref 8.9–10.3)
Chloride: 104 mmol/L (ref 98–111)
Creatinine, Ser: 0.6 mg/dL (ref 0.44–1.00)
GFR, Estimated: >60 mL/min (ref >60)
Glucose, Bld: 66 mg/dL — ABNORMAL LOW (ref 70–99)
Potassium: 4.3 mmol/L (ref 3.5–5.1)
Sodium: 141 mmol/L (ref 135–145)
Total Bilirubin: 1.4 mg/dL — ABNORMAL HIGH (ref 0.3–1.2)
Total Protein: 5.9 g/dL — ABNORMAL LOW (ref 6.5–8.1)

## 2023-02-19 LAB — PROTIME-INR
INR: 1.3 — ABNORMAL HIGH (ref 0.8–1.2)
Prothrombin Time: 16.3 seconds — ABNORMAL HIGH (ref 11.4–15.2)

## 2023-02-19 MED ORDER — ZOLEDRONIC ACID 4 MG/100ML IV SOLN
4.0000 mg | Freq: Once | INTRAVENOUS | Status: AC
  Administered 2023-02-19: 4 mg via INTRAVENOUS
  Filled 2023-02-19: qty 100

## 2023-02-19 NOTE — Plan of Care (Signed)
Problem: Education: Goal: Knowledge of General Education information will improve Description Including pain rating scale, medication(s)/side effects and non-pharmacologic comfort measures Outcome: Progressing   Problem: Health Behavior/Discharge Planning: Goal: Ability to manage health-related needs will improve Outcome: Progressing   Problem: Clinical Measurements: Goal: Ability to maintain clinical measurements within normal limits will improve Outcome: Progressing Goal: Will remain free from infection Outcome: Progressing Goal: Diagnostic test results will improve Outcome: Progressing Goal: Respiratory complications will improve Outcome: Progressing Goal: Cardiovascular complication will be avoided Outcome: Progressing   Problem: Activity: Goal: Risk for activity intolerance will decrease Outcome: Progressing   Problem: Nutrition: Goal: Adequate nutrition will be maintained Outcome: Progressing   Problem: Elimination: Goal: Will not experience complications related to bowel motility Outcome: Progressing Goal: Will not experience complications related to urinary retention Outcome: Progressing   Problem: Pain Managment: Goal: General experience of comfort will improve Outcome: Progressing

## 2023-02-19 NOTE — Progress Notes (Signed)
PROGRESS NOTE    Alicia Vasquez  WUJ:[id number] DOB: 07-Feb-1941 DOA: 2023/03/03 PCP: Cecille Rubin, FNP    Brief Narrative:  Alicia Vasquez is a 82 y.o. 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  History provided by the patient as well as her close friend and healthcare power of attorney who is at the bedside with her.  She lives in the facility, for the last 3 to 4 days, she has been having upper abdominal and chest pain, with some pain radiating to her back.  Found to have worsening metastatic disease.   Assessment and Plan: Acute pancreatitis-unclear etiology at this time -advance diet as tolerated -IV lactated Ringer's infusion -Pain and nausea medication as needed -triglycerides: 175 -CT scan: Interval development of diffuse hepatic and skeletal metastatic disease since the 07/28/2021 exam.  Interval development just since yesterday CT findings of acute interstitial pancreatitis with peripancreatic fluid tracking along the pancreatic tail into the upper left paracolic gutter with trace amount in the posterior pelvis. No pancreatic necrosis or ductal dilatation. -Oncology requests IR consult for liver biopsy- plan for Monday   Hypertension -Continue home lisinopril -IV hydralazine for uncontrolled hypertension   Hypothryoidism -continue Synthroid   Breast cancer -on monthly fulvestrant injections -CT scan: with worsening metastasis-- oncology to see but most likely will not be a candidate for aggressive treatment -palliative care consult   Hypercalcemia -not better with IVF -will try bisphosphonate    DVT prophylaxis: enoxaparin (LOVENOX) injection 40 mg Start: 02/17/23 1000 SCDs Start: 03-03-23 2202    Code Status: Full Code Family Communication: called [name] 9/21  Disposition Plan:  Level of care: Med-Surg Status is:  Inpatient     Consultants:  oncology   Subjective: Unable to tell me if she is having pain  Objective: Vitals:   02/18/23 0602 02/18/23 2017 02/19/23 0354 02/19/23 0617  BP: (!) 144/55 (!) 161/66 (!) 174/57 (!) 145/58  Pulse: 96 (!) 101 (!) 106 95  Resp: 16 18 18    Temp: 97.7 F (36.5 C) 98.4 F (36.9 C) 97.8 F (36.6 C)   TempSrc: Oral Oral Oral   SpO2: 96% 98% 97%   Weight:        Intake/Output Summary (Last 24 hours) at 02/19/2023 1020 Last data filed at 02/19/2023 9562 Gross per 24 hour  Intake 2926.74 ml  Output 250 ml  Net 2676.74 ml   Filed Weights   Mar 03, 2023 2327  Weight: 86.7 kg    Examination:   General: Appearance:    Obese female in no acute distress     Lungs:     respirations unlabored  Heart:    Normal heart rate. .    MS:   All extremities are intact.    Neurologic:   Awake, alert       Data Reviewed: I have personally reviewed following labs and imaging studies  CBC: Recent Labs  Lab 03-03-2023 1840 02/17/23 0012 02/19/23 0711  WBC 17.7* 17.6* 16.4*  HGB 12.6 11.6* 10.2*  HCT 39.9 36.8 34.0*  MCV 89.1 89.3 94.4  PLT 384 335 252   Basic Metabolic Panel: Recent Labs  Lab 2023-03-03 1840 02/17/23 0012 02/19/23 0711  NA 137 135 141  K 4.1 3.8 4.3  CL 102 103 104  CO2 23 23 27   GLUCOSE 120* 95 66*  BUN 17 16 15   CREATININE 0.76 0.83 0.60  CALCIUM  11.0* 10.4* 10.4*   GFR: Estimated Creatinine Clearance: 56.8 mL/min (by C-G formula based on SCr of 0.6 mg/dL). Liver Function Tests: Recent Labs  Lab 02/03/2023 1840 02/17/23 0012 02/19/23 0711  AST 35 32 31  ALT 17 17 14   ALKPHOS 219* 201* 152*  BILITOT 1.0 0.9 1.4*  PROT 7.3 6.3* 5.9*  ALBUMIN 3.1* 2.8* 2.2*   Recent Labs  Lab 02/15/2023 1840 02/17/23 0012  LIPASE 615* 504*   No results for input(s): "AMMONIA" in the last 168 hours. Coagulation Profile: Recent Labs  Lab 02/19/23 0711  INR 1.3*   Cardiac Enzymes: No results for input(s): "CKTOTAL", "CKMB",  "CKMBINDEX", "TROPONINI" in the last 168 hours. BNP (last 3 results) No results for input(s): "PROBNP" in the last 8760 hours. HbA1C: No results for input(s): "HGBA1C" in the last 72 hours. CBG: No results for input(s): "GLUCAP" in the last 168 hours. Lipid Profile: Recent Labs    02/08/2023 1840  TRIG 175*   Thyroid Function Tests: No results for input(s): "TSH", "T4TOTAL", "FREET4", "T3FREE", "THYROIDAB" in the last 72 hours. Anemia Panel: No results for input(s): "VITAMINB12", "FOLATE", "FERRITIN", "TIBC", "IRON", "RETICCTPCT" in the last 72 hours. Sepsis Labs: No results for input(s): "PROCALCITON", "LATICACIDVEN" in the last 168 hours.  No results found for this or any previous visit (from the past 240 hour(s)).       Radiology Studies: No results found.      Scheduled Meds:  docusate sodium  100 mg Oral BID   enoxaparin (LOVENOX) injection  40 mg Subcutaneous Q24H   levothyroxine  112 mcg Oral Q0600   lisinopril  10 mg Oral Daily   pantoprazole (PROTONIX) IV  40 mg Intravenous Q24H   zoledronic acid (ZOMETA) IVPB Oncology  4 mg Intravenous Once   Continuous Infusions:  lactated ringers 125 mL/hr at 02/19/23 0606     LOS: 3 days    Time spent: 45 minutes spent on chart review, discussion with nursing staff, consultants, updating family and interview/physical exam; more than 50% of that time was spent in counseling and/or coordination of care.    Joseph Art, DO [facility] Available via Epic secure chat 7am-7pm After these hours, please refer to coverage provider listed on [email or web address] 02/19/2023, 10:20 AM

## 2023-02-19 NOTE — Progress Notes (Signed)
Patient and MPOA, Alicia Vasquez would like to cancel liver biopsy, change code status to DNR.  Orders placed  Alicia Vasquez

## 2023-02-19 NOTE — Progress Notes (Addendum)
PMT brief progress note.   Chart reviewed, patient noted to be resting, does not appear to be in distress,does not really engage much, briefly discussed with [facility] physician Dr Benjamine Mola, liver biopsy in am 02-20-23 possibly. Oncology also following. PMT will await biopsy results and remains available for further GOC discussions. Pain management with IV Dilaudid PRN and PO Oxy IR PRN. Already on bowel and anti emetic regimen.  BP (!) 181/65 (BP Location: Left Arm)   Pulse 97   Temp 97.8 F (36.6 C) (Oral)   Resp 18   Wt 86.7 kg   SpO2 97%   BMI 32.81 kg/m  Labs and imaging noted on chart.  Low MDM Rosalin Hawking MD Addendum: Received notification from RN colleague that patient and HCPOA are in favor of DNR and D/C liver biopsy. Call placed and discussed with Steward Ros. DNR DNI and no liver biopsy and no further cancer treatments confirmed. Will monitor hospital course so as to determine whether the patient ought to be considered for residential hospice or whether she can go back to [facility] with hospice.  PMT to follow.  Rosalin Hawking MD [facility].

## 2023-02-19 NOTE — Plan of Care (Signed)

## 2023-02-19 NOTE — Plan of Care (Signed)
  Problem: Safety: Goal: Ability to remain free from injury will improve 02/19/2023 0449 by Elbert Ewings, RN Outcome: Progressing 02/19/2023 0448 by Elbert Ewings, RN Outcome: Not Progressing   Problem: Skin Integrity: Goal: Risk for impaired skin integrity will decrease 02/19/2023 0449 by Elbert Ewings, RN Outcome: Progressing 02/19/2023 0448 by Elbert Ewings, RN Outcome: Not Progressing

## 2023-02-20 ENCOUNTER — Inpatient Hospital Stay: Payer: Medicare Other

## 2023-02-20 DIAGNOSIS — K859 Acute pancreatitis without necrosis or infection, unspecified: Secondary | ICD-10-CM | POA: Diagnosis not present

## 2023-02-20 DIAGNOSIS — Z515 Encounter for palliative care: Secondary | ICD-10-CM | POA: Diagnosis not present

## 2023-02-20 DIAGNOSIS — R531 Weakness: Secondary | ICD-10-CM

## 2023-02-20 DIAGNOSIS — I10 Essential (primary) hypertension: Secondary | ICD-10-CM | POA: Diagnosis not present

## 2023-02-20 DIAGNOSIS — Z7189 Other specified counseling: Secondary | ICD-10-CM | POA: Diagnosis not present

## 2023-02-20 LAB — CBC
HCT: 34.5 % — ABNORMAL LOW (ref 36.0–46.0)
Hemoglobin: 10.4 g/dL — ABNORMAL LOW (ref 12.0–15.0)
MCH: 28.2 pg (ref 26.0–34.0)
MCHC: 30.1 g/dL (ref 30.0–36.0)
MCV: 93.5 fL (ref 80.0–100.0)
Platelets: 258 10*3/uL (ref 150–400)
RBC: 3.69 MIL/uL — ABNORMAL LOW (ref 3.87–5.11)
RDW: 16.8 % — ABNORMAL HIGH (ref 11.5–15.5)
WBC: 16.5 10*3/uL — ABNORMAL HIGH (ref 4.0–10.5)
nRBC: 0 % (ref 0.0–0.2)

## 2023-02-20 LAB — BASIC METABOLIC PANEL
Anion gap: 9 (ref 5–15)
BUN: 14 mg/dL (ref 8–23)
CO2: 24 mmol/L (ref 22–32)
Calcium: 9.7 mg/dL (ref 8.9–10.3)
Chloride: 104 mmol/L (ref 98–111)
Creatinine, Ser: 0.66 mg/dL (ref 0.44–1.00)
GFR, Estimated: >60 mL/min (ref >60)
Glucose, Bld: 94 mg/dL (ref 70–99)
Potassium: 4 mmol/L (ref 3.5–5.1)
Sodium: 137 mmol/L (ref 135–145)

## 2023-02-20 MED ORDER — HYDROMORPHONE HCL 1 MG/ML IJ SOLN
1.0000 mg | INTRAMUSCULAR | Status: DC | PRN
  Administered 2023-02-20: 2 mg via INTRAVENOUS
  Filled 2023-02-20: qty 2

## 2023-02-20 MED ORDER — HYDROMORPHONE HCL-NACL 50-0.9 MG/50ML-% IV SOLN
1.0000 mg/h | INTRAVENOUS | Status: DC
  Administered 2023-02-20 (×2): 1 mg/h via INTRAVENOUS
  Filled 2023-02-20: qty 50

## 2023-02-20 NOTE — Progress Notes (Signed)
   02/20/23 0856  Vitals  BP (!) 151/68  MEWS COLOR  MEWS Score Color Yellow  MEWS Score  MEWS Temp 0  MEWS Systolic 0  MEWS Pulse 2  MEWS RR 0  MEWS LOC 0  MEWS Score 2  Provider Notification  Provider Name/Title Dr. Marlin Canary  Date Provider Notified 02/20/23  Time Provider Notified [phone]  Method of Notification  (chat room)  Notification Reason Other (Comment) (yellow mews)  Provider response No new orders  Date of Provider Response 02/20/23  Time of Provider Response [phone]

## 2023-02-20 NOTE — Progress Notes (Signed)
PT  Note  Patient Details Name: Alicia Vasquez [facility] MRN: [id number] DOB: 1940/11/19   Cancelled Treatment:    Reason Eval/Treat Not Completed: pt and MPOA, [name] have elected for pt to transition to hospice services. No further PT needs and signing off at this time. Thank you for the referral.   Johnny Bridge, PT Acute Rehab  Jacqualyn Posey 02/20/2023, 11:56 AM

## 2023-02-20 NOTE — Progress Notes (Signed)
PROGRESS NOTE    Alicia Vasquez  NAT:[id number] DOB: Sep 25, 1940 DOA: 02/27/2023 PCP: Cecille Rubin, FNP    Brief Narrative:  Alicia Vasquez is a 82 y.o. 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  History provided by the patient as well as her close friend and healthcare power of attorney who is at the bedside with her.  She lives in the facility, for the last 3 to 4 days, she has been having upper abdominal and chest pain, with some pain radiating to her back.  Found to have worsening metastatic disease.  Plan to transition to comfort care.     Assessment and Plan: Acute pancreatitis-unclear etiology at this time -CT scan: Interval development of diffuse hepatic and skeletal metastatic disease since the 07/28/2021 exam.  Interval development just since yesterday CT findings of acute interstitial pancreatitis with peripancreatic fluid tracking along the pancreatic tail into the upper left paracolic gutter with trace amount in the posterior pelvis. No pancreatic necrosis or ductal dilatation. -initially Oncology requests IR consult for liver biopsy which was planned for Monday but patient and MPOA have instead opted to transition to comfort care in light of her worsening metastatic disease   Hypercalcemia Hypertension  Hypothryoidism Breast cancer -on monthly fulvestrant injections -CT scan: with worsening metastasis-- oncology to see but most likely will not be a candidate for aggressive treatment -palliative care consult       DVT prophylaxis: enoxaparin (LOVENOX) injection 40 mg Start: 02/17/23 1000 SCDs Start: 02/14/2023 2202    Code Status: Limited: Do not attempt resuscitation (DNR) -DNR-LIMITED -Do Not Intubate/DNI  Family Communication: called Alicia Vasquez 9/21  Disposition Plan:  Level of care: Med-Surg Status is: Inpatient      Consultants:  Oncology Palliative care   Subjective: hurting  Objective: Vitals:   02/19/23 2018 02/20/23 0529 02/20/23 0851 02/20/23 0856  BP: (!) 155/63 (!) 165/77 (!) 151/68 (!) 151/68  Pulse: (!) 107  (!) 112   Resp: 18 18 18    Temp: 98.4 F (36.9 C) 98.8 F (37.1 C)    TempSrc: Oral Oral    SpO2: 97% 95% 93%   Weight:        Intake/Output Summary (Last 24 hours) at 02/20/2023 1121 Last data filed at 02/20/2023 0527 Gross per 24 hour  Intake 2204.57 ml  Output 1300 ml  Net 904.57 ml   Filed Weights   02/06/2023 2327  Weight: 86.7 kg    Examination:  In bed, uncomfortable appearing   Data Reviewed: I have personally reviewed following labs and imaging studies  CBC: Recent Labs  Lab 02/25/2023 1840 02/17/23 0012 02/19/23 0711 02/20/23 0540  WBC 17.7* 17.6* 16.4* 16.5*  HGB 12.6 11.6* 10.2* 10.4*  HCT 39.9 36.8 34.0* 34.5*  MCV 89.1 89.3 94.4 93.5  PLT 384 335 252 258   Basic Metabolic Panel: Recent Labs  Lab 01/30/2023 1840 02/17/23 0012 02/19/23 0711 02/20/23 0540  NA 137 135 141 137  K 4.1 3.8 4.3 4.0  CL 102 103 104 104  CO2 23 23 27 24   GLUCOSE 120* 95 66* 94  BUN 17 16 15 14   CREATININE 0.76 0.83 0.60 0.66  CALCIUM 11.0* 10.4* 10.4* 9.7   GFR: Estimated Creatinine Clearance: 56.8 mL/min (by C-G formula based on SCr of 0.66 mg/dL). Liver Function Tests: Recent Labs  Lab 01/31/2023 1840 02/17/23 0012 02/19/23 0711  AST 35 32  31  ALT 17 17 14   ALKPHOS 219* 201* 152*  BILITOT 1.0 0.9 1.4*  PROT 7.3 6.3* 5.9*  ALBUMIN 3.1* 2.8* 2.2*   Recent Labs  Lab 02/05/2023 1840 02/17/23 0012  LIPASE 615* 504*   No results for input(s): "AMMONIA" in the last 168 hours. Coagulation Profile: Recent Labs  Lab 02/19/23 0711  INR 1.3*   Cardiac Enzymes: No results for input(s): "CKTOTAL", "CKMB", "CKMBINDEX", "TROPONINI" in the last 168 hours. BNP (last 3 results) No results for input(s): "PROBNP" in the last 8760 hours. HbA1C: No  results for input(s): "HGBA1C" in the last 72 hours. CBG: No results for input(s): "GLUCAP" in the last 168 hours. Lipid Profile: No results for input(s): "CHOL", "HDL", "LDLCALC", "TRIG", "CHOLHDL", "LDLDIRECT" in the last 72 hours.  Thyroid Function Tests: No results for input(s): "TSH", "T4TOTAL", "FREET4", "T3FREE", "THYROIDAB" in the last 72 hours. Anemia Panel: No results for input(s): "VITAMINB12", "FOLATE", "FERRITIN", "TIBC", "IRON", "RETICCTPCT" in the last 72 hours. Sepsis Labs: No results for input(s): "PROCALCITON", "LATICACIDVEN" in the last 168 hours.  No results found for this or any previous visit (from the past 240 hour(s)).       Radiology Studies: No results found.      Scheduled Meds:  docusate sodium  100 mg Oral BID   enoxaparin (LOVENOX) injection  40 mg Subcutaneous Q24H   levothyroxine  112 mcg Oral Q0600   lisinopril  10 mg Oral Daily   pantoprazole (PROTONIX) IV  40 mg Intravenous Q24H   Continuous Infusions:  HYDROmorphone     lactated ringers 1,000 mL (02/20/23 0819)     LOS: 4 days    Time spent: 45 minutes spent on chart review, discussion with nursing staff, consultants, updating family and interview/physical exam; more than 50% of that time was spent in counseling and/or coordination of care.    Joseph Art, DO [facility] Available via Epic secure chat 7am-7pm After these hours, please refer to coverage provider listed on [email or web address] 02/20/2023, 11:21 AM

## 2023-02-20 NOTE — Progress Notes (Signed)
Daily Progress Note   Patient Name: Alicia Vasquez       Date: 02/20/2023 DOB: [birth year]  Age: 82 y.o. MRN#: [id number] Attending Physician: Joseph Art, DO Primary Care Physician: Cecille Rubin, FNP Admit Date: 02-27-2023  Reason for Consultation/Follow-up: Establishing goals of care  Subjective:  Resting in bed, moans at times, appears to be in mild distress.   Length of Stay: 4  Current Medications: Scheduled Meds:   docusate sodium  100 mg Oral BID   enoxaparin (LOVENOX) injection  40 mg Subcutaneous Q24H   levothyroxine  112 mcg Oral Q0600   lisinopril  10 mg Oral Daily   pantoprazole (PROTONIX) IV  40 mg Intravenous Q24H    Continuous Infusions:  HYDROmorphone     lactated ringers 1,000 mL (02/20/23 0819)    PRN Meds: acetaminophen **OR** acetaminophen, albuterol, benzocaine, bisacodyl, hydrALAZINE, HYDROmorphone (DILAUDID) injection, ondansetron **OR** ondansetron (ZOFRAN) IV, mouth rinse, oxyCODONE, phenol, traZODone  Physical Exam         Resting in bed Mild distress Mildly labored respirations No edema Generalized weakness  Vital Signs: BP (!) 151/68   Pulse (!) 112   Temp 98.8 F (37.1 C) (Oral)   Resp 18   Wt 86.7 kg   SpO2 93%   BMI 32.81 kg/m  SpO2: SpO2: 93 % O2 Device: O2 Device: Nasal Cannula O2 Flow Rate: O2 Flow Rate (L/min): 2 L/min  Intake/output summary:  Intake/Output Summary (Last 24 hours) at 02/20/2023 1012 Last data filed at 02/20/2023 2130 Gross per 24 hour  Intake 2204.57 ml  Output 1300 ml  Net 904.57 ml   LBM: Last BM Date :  (PTA) Baseline Weight: Weight: 86.7 kg Most recent weight: Weight: 86.7 kg       Palliative Assessment/Data:      Patient Active Problem List   Diagnosis Date Noted   Palliative  care by specialist 02/19/2023   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) 02/17/2023   Pressure injury of skin 02/17/2023   Acute pancreatitis Feb 27, 2023   VTE (venous thromboembolism) 11/24/2022   Poor social situation 09/02/2022   GERD (gastroesophageal reflux disease) 06/17/2021   Bilateral leg edema 06/03/2021   Pressure injury of left buttock, stage 1 06/03/2021   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   Debility 04/27/2021   Morbid (severe) obesity due to  excess calories (HCC) 05/31/2019   Major depressive disorder, single episode, unspecified 05/31/2019   Aortic atherosclerosis (HCC) 06/13/2017   Goals of care, counseling/discussion 04/19/2017   Malignant neoplasm metastatic to bone (HCC) 01/25/2017   Endometrial hyperplasia 01/16/2017   Lytic bone lesion of left femur 01/15/2017   Hip fracture, pathological (HCC) 01/15/2017   Lymphedema 09/17/2015   Bilateral knee pain 04/03/2015   Arm edema 08/28/2014   Vitamin D deficiency 04/23/2014   Hyperparathyroidism (HCC) 04/23/2014   Depression 07/18/2013   Malignant neoplasm of upper-outer quadrant of right breast in female, estrogen receptor positive (HCC) 07/15/2013   Overactive bladder 01/30/2013   Essential hypertension 09/27/2011   Hearing loss 09/27/2011   Seasonal allergies 09/27/2011   Hypothyroidism 08/26/2011    Palliative Care Assessment & Plan   Patient Profile:    Assessment:  Metastatic breast cancer Acute pancreatitis Hypercalcemia, likely of malignancy Functional decline Uncontrolled pain  Recommendations/Plan:  Comfort care Residential hospice Low dose Hydromorphone infusion   Goals of Care and Additional Recommendations: Limitations on Scope of Treatment: Full Comfort Care  Code Status:    Code Status Orders  (From admission, onward)           Start     Ordered   02/19/23 1625  Do not attempt resuscitation (DNR)- Limited -Do Not Intubate (DNI)  (Code Status)  Continuous       Question  Answer Comment  If pulseless and not breathing No CPR or chest compressions.   In Pre-Arrest Conditions (Patient Is Breathing and Has A Pulse) Do not intubate. Provide all appropriate non-invasive medical interventions. Avoid ICU transfer unless indicated or required.   Consent: Discussion documented in EHR or advanced directives reviewed      02/19/23 1624           Code Status History     Date Active Date Inactive Code Status Order ID Comments User Context   02/19/2023 2203 02/19/2023 1624 Full Code [id number]  Maryln Gottron, MD ED   03/15/2022 0027 03/17/2022 1908 Full Code [id number]  John Giovanni, MD Inpatient   01/05/2021 0210 01/12/2021 1851 Full Code [id number]  Eduard Clos, MD ED   01/26/2017 1154 02/03/2017 1402 Full Code [id number]  [name] Inpatient   01/15/2017 2353 01/21/2017 2124 Full Code [id number]  [name], MD ED   09/09/2013 1443 09/10/2013 1219 Full Code [id number]  Emelia Loron, MD Inpatient      Advance Directive Documentation    Flowsheet Row Most Recent Value  Type of Advance Directive Healthcare Power of Attorney  [Karen Ayers]  Pre-existing out of facility DNR order (yellow form or pink MOST form) --  "MOST" Form in Place? --       Prognosis:  < 2 weeks  Discharge Planning: Hospice facility  Care plan was discussed with patient, Steward Ros and the IDT  Thank you for allowing the Palliative Medicine Team to assist in the care of this patient. Mod MDM     Greater than 50%  of this time was spent counseling and coordinating care related to the above assessment and plan.  Rosalin Hawking, MD  Please contact Palliative Medicine Team phone at [phone] for questions and concerns.

## 2023-02-20 NOTE — Progress Notes (Signed)
   02/20/23 0851  Vitals  BP (!) 151/68  MAP (mmHg) 90  BP Location Left Arm  BP Method Automatic  Patient Position (if appropriate) Lying  Pulse Rate (!) 112  Pulse Rate Source Dinamap  Resp 18  Level of Consciousness  Level of Consciousness Alert  MEWS COLOR  MEWS Score Color Yellow  Oxygen Therapy  SpO2 93 %  O2 Device Nasal Cannula  O2 Flow Rate (L/min) 2 L/min  Patient Activity (if Appropriate) In bed  Pain Assessment  Pain Scale 0-10  Pain Score 8  Pain Type Acute pain  Pain Location Buttocks  Pain Orientation Right;Left  Pain Descriptors / Indicators Aching;Constant;Discomfort  Pain Frequency Constant  Pain Onset On-going  Pain Intervention(s) Medication (See eMAR);Repositioned  POSS Scale (Pasero Opioid Sedation Scale)  POSS *See Group Information* 1-Acceptable,Awake and alert  PCA/Epidural/Spinal Assessment  Respiratory Pattern Regular;Unlabored  MEWS Score  MEWS Temp 0  MEWS Systolic 0  MEWS Pulse 2  MEWS RR 0  MEWS LOC 0  MEWS Score 2

## 2023-02-20 NOTE — Plan of Care (Signed)

## 2023-02-21 ENCOUNTER — Inpatient Hospital Stay: Payer: Medicare Other

## 2023-02-21 DIAGNOSIS — Z711 Person with feared health complaint in whom no diagnosis is made: Secondary | ICD-10-CM

## 2023-02-21 DIAGNOSIS — Z79899 Other long term (current) drug therapy: Secondary | ICD-10-CM

## 2023-02-21 DIAGNOSIS — Z66 Do not resuscitate: Secondary | ICD-10-CM | POA: Diagnosis not present

## 2023-02-21 DIAGNOSIS — Z515 Encounter for palliative care: Secondary | ICD-10-CM | POA: Diagnosis not present

## 2023-02-21 DIAGNOSIS — C787 Secondary malignant neoplasm of liver and intrahepatic bile duct: Secondary | ICD-10-CM | POA: Diagnosis not present

## 2023-02-21 DIAGNOSIS — R52 Pain, unspecified: Secondary | ICD-10-CM

## 2023-02-21 DIAGNOSIS — Z7189 Other specified counseling: Secondary | ICD-10-CM | POA: Diagnosis not present

## 2023-02-21 DIAGNOSIS — C50919 Malignant neoplasm of unspecified site of unspecified female breast: Secondary | ICD-10-CM | POA: Diagnosis not present

## 2023-02-21 DIAGNOSIS — R0989 Other specified symptoms and signs involving the circulatory and respiratory systems: Secondary | ICD-10-CM

## 2023-02-21 DIAGNOSIS — K859 Acute pancreatitis without necrosis or infection, unspecified: Secondary | ICD-10-CM | POA: Diagnosis not present

## 2023-02-21 MED ORDER — GLYCOPYRROLATE 0.2 MG/ML IJ SOLN
0.2000 mg | INTRAMUSCULAR | Status: DC | PRN
  Administered 2023-02-21: 0.2 mg via INTRAVENOUS
  Filled 2023-02-21: qty 1

## 2023-02-22 DIAGNOSIS — H9190 Unspecified hearing loss, unspecified ear: Secondary | ICD-10-CM | POA: Diagnosis not present

## 2023-02-22 DIAGNOSIS — I10 Essential (primary) hypertension: Secondary | ICD-10-CM | POA: Diagnosis not present

## 2023-02-23 ENCOUNTER — Encounter: Payer: Medicare Other | Admitting: Licensed Clinical Social Worker

## 2023-02-28 NOTE — Progress Notes (Signed)
PROGRESS NOTE    Alicia Vasquez  WGN:[id number] DOB: 01-03-1941 DOA: 02/02/2023 PCP: Cecille Rubin, FNP    Brief Narrative:  Alicia Vasquez is a 82 y.o. 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  History provided by the patient as well as her close friend and healthcare power of attorney who is at the bedside with her.  She lives in the facility, for the last 3 to 4 days, she has been having upper abdominal and chest pain, with some pain radiating to her back.  Found to have worsening metastatic disease and pancreatitis.  Plan to transition to comfort care.     Assessment and Plan: Acute pancreatitis-unclear etiology at this time -CT scan: Interval development of diffuse hepatic and skeletal metastatic disease since the 07/28/2021 exam.  Interval development just since yesterday CT findings of acute interstitial pancreatitis with peripancreatic fluid tracking along the pancreatic tail into the upper left paracolic gutter with trace amount in the posterior pelvis. No pancreatic necrosis or ductal dilatation. -initially Oncology requests IR consult for liver biopsy as patient wanted full treatment but patient and MPOA have now opted to transition to comfort care in light of her worsening metastatic disease -residential hospice vs in hospital death-- TOC and palliative consulted   Hypercalcemia Hypertension  Hypothryoidism Breast cancer -on monthly fulvestrant injections -CT scan: with worsening metastasis-- oncology to see but most likely will not be a candidate for aggressive treatment -palliative care consult       DVT prophylaxis: enoxaparin (LOVENOX) injection 40 mg Start: 02/17/23 1000 SCDs Start: 02/04/2023 2202    Code Status: Limited: Do not attempt resuscitation (DNR) -DNR-LIMITED -Do Not Intubate/DNI    Disposition Plan:  Level  of care: Med-Surg Status is: Inpatient     Consultants:  Oncology Palliative care   Subjective: Snoring respirations  Objective: Vitals:   02/20/23 0856 02/20/23 1325 02/20/23 2204 03-23-2023 0448  BP: (!) 151/68 (!) 129/53 (!) 100/52 (!) 109/47  Pulse:  (!) 101  (!) 102  Resp:  18 18 18   Temp:  98.4 F (36.9 C) 97.6 F (36.4 C) 99 F (37.2 C)  TempSrc:  Oral Oral Oral  SpO2:  97% (!) 89% 95%  Weight:        Intake/Output Summary (Last 24 hours) at March 23, 2023 0838 Last data filed at 23-Mar-2023 0300 Gross per 24 hour  Intake 32.64 ml  Output 200 ml  Net -167.36 ml   Filed Weights   02/10/2023 2327  Weight: 86.7 kg    Examination:  In bed, NAD, snoring even respirations    Data Reviewed: I have personally reviewed following labs and imaging studies  CBC: Recent Labs  Lab 02/27/2023 1840 02/17/23 0012 02/19/23 0711 02/20/23 0540  WBC 17.7* 17.6* 16.4* 16.5*  HGB 12.6 11.6* 10.2* 10.4*  HCT 39.9 36.8 34.0* 34.5*  MCV 89.1 89.3 94.4 93.5  PLT 384 335 252 258   Basic Metabolic Panel: Recent Labs  Lab 02/07/2023 1840 02/17/23 0012 02/19/23 0711 02/20/23 0540  NA 137 135 141 137  K 4.1 3.8 4.3 4.0  CL 102 103 104 104  CO2 23 23 27 24   GLUCOSE 120* 95 66* 94  BUN 17 16 15 14   CREATININE 0.76 0.83 0.60 0.66  CALCIUM 11.0* 10.4* 10.4* 9.7   GFR: Estimated Creatinine Clearance: 56.8 mL/min (by C-G formula based on SCr of 0.66 mg/dL). Liver  Function Tests: Recent Labs  Lab 02-26-2023 1840 02/17/23 0012 02/19/23 0711  AST 35 32 31  ALT 17 17 14   ALKPHOS 219* 201* 152*  BILITOT 1.0 0.9 1.4*  PROT 7.3 6.3* 5.9*  ALBUMIN 3.1* 2.8* 2.2*   Recent Labs  Lab 2023-02-26 1840 02/17/23 0012  LIPASE 615* 504*   No results for input(s): "AMMONIA" in the last 168 hours. Coagulation Profile: Recent Labs  Lab 02/19/23 0711  INR 1.3*   Cardiac Enzymes: No results for input(s): "CKTOTAL", "CKMB", "CKMBINDEX", "TROPONINI" in the last 168 hours. BNP (last 3  results) No results for input(s): "PROBNP" in the last 8760 hours. HbA1C: No results for input(s): "HGBA1C" in the last 72 hours. CBG: No results for input(s): "GLUCAP" in the last 168 hours. Lipid Profile: No results for input(s): "CHOL", "HDL", "LDLCALC", "TRIG", "CHOLHDL", "LDLDIRECT" in the last 72 hours.  Thyroid Function Tests: No results for input(s): "TSH", "T4TOTAL", "FREET4", "T3FREE", "THYROIDAB" in the last 72 hours. Anemia Panel: No results for input(s): "VITAMINB12", "FOLATE", "FERRITIN", "TIBC", "IRON", "RETICCTPCT" in the last 72 hours. Sepsis Labs: No results for input(s): "PROCALCITON", "LATICACIDVEN" in the last 168 hours.  No results found for this or any previous visit (from the past 240 hour(s)).       Radiology Studies: No results found.      Scheduled Meds:  docusate sodium  100 mg Oral BID   enoxaparin (LOVENOX) injection  40 mg Subcutaneous Q24H   levothyroxine  112 mcg Oral Q0600   lisinopril  10 mg Oral Daily   pantoprazole (PROTONIX) IV  40 mg Intravenous Q24H   Continuous Infusions:  HYDROmorphone 1 mg/hr (02/20/23 1638)     LOS: 5 days    Time spent: 45 minutes spent on chart review, discussion with nursing staff, consultants, updating family and interview/physical exam; more than 50% of that time was spent in counseling and/or coordination of care.    Joseph Art, DO [facility] Available via Epic secure chat 7am-7pm After these hours, please refer to coverage provider listed on [email or web address] 02/19/2023, 8:38 AM

## 2023-02-28 NOTE — Death Summary Note (Signed)
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  History provided by the patient as well as her close friend and healthcare power of attorney who is at the bedside with her.  She lives in the facility, for the last 3 to 4 days, she has been having upper abdominal and chest pain, with some pain radiating to her back.  Found to have worsening metastatic disease and pancreatitis.  Plan to transition to comfort care.    Assessment and Plan:  Acute pancreatitis-unclear etiology at this time -CT scan: Interval development of diffuse hepatic and skeletal metastatic disease since the 07/28/2021 exam.  Interval development just since yesterday CT findings of acute interstitial pancreatitis with peripancreatic fluid tracking along the pancreatic tail into the upper left paracolic gutter with trace amount in the posterior pelvis. No pancreatic necrosis or ductal dilatation. -initially Oncology requests IR consult for liver biopsy as patient wanted full treatment but patient and MPOA have now opted to transition to comfort care in light of her worsening metastatic disease -patient had an in hospital  death    Hypercalcemia Hypertension  Hypothryoidism Breast cancer -on monthly fulvestrant injections -CT scan: with worsening metastasis-- oncology saw and patient opted to not treat   Pressure Injury Mar 10, 2023 Sacrum Lower Stage 1 -  Intact skin with non-blanchable redness of a localized area usually over a bony prominence. (Active)  03/10/2023 2330  Location: Sacrum  Location Orientation: Lower  Staging: Stage 1 -  Intact skin with non-blanchable redness of a localized area usually over a bony prominence.  Wound Description (Comments):   Present on Admission:         Consultations: oncology Palliative care  The results of significant diagnostics from this hospitalization (including imaging, microbiology, ancillary and laboratory) are listed below for reference.   Significant Diagnostic Studies: CT Angio Chest/Abd/Pel for Dissection W and/or Wo Contrast  Result Date: 10-Mar-2023 CLINICAL DATA:  Stage IV breast cancer, severe back pain, abdominal pain and severe hypertension on presentation. Evaluate for aortic dissection. Patient suffered a fall injury 02/14/2023 and could also have a spinal fracture. EXAM: CT ANGIOGRAPHY CHEST, ABDOMEN AND PELVIS TECHNIQUE: Initial chest CT without contrast was performed to the adrenal glands to evaluate for aortic hematoma. Multidetector CT imaging through the chest, abdomen and pelvis was performed using the standard protocol during bolus administration of intravenous contrast. Multiplanar reconstructed images and MIPs were obtained and reviewed to evaluate the vascular anatomy. RADIATION DOSE REDUCTION: This exam was performed according to the departmental dose-optimization program which includes automated exposure control, adjustment of the mA and/or kV according to patient size and/or use of iterative reconstruction technique. CONTRAST:  OMNIPAQUE IOHEXOL  350 MG/ML SOLN COMPARISON:  Cancer restaging chest, abdomen and pelvis CT with IV contrast  yesterday 02/15/2023, CT chest, abdomen and pelvis with IV contrast 07/28/2021. FINDINGS: CTA CHEST FINDINGS Cardiovascular: The cardiac size is normal. Chronic calcification again noted in the left inferolateral marker ring. There is no pericardial effusion no visible coronary calcific plaques. There are calcifications along the aortic valve leaflets but no aortic aneurysm, penetrating ulcers, dissection or stenosis. Thoracic aorta is slightly tortuous with mild scattered calcific plaque. The great vessels are widely patent. Small amount of calcific plaque at the brachiocephalic bifurcation is seen but no other great vessel disease. Pulmonary veins are nondistended. Pulmonary arteries are normal caliber without embolus through the segmental arteries on this nondedicated exam. Mediastinum/Nodes: No enlarged mediastinal, hilar, or axillary lymph nodes. Thyroid gland, trachea, and esophagus demonstrate no significant findings. Small hiatal hernia again noted. Single calcified right hilar node. Lungs/Pleura: Increased subsegmental atelectasis in the posterior lungs are seen as compared with yesterday's exam. Old calcified granuloma right lower lobe. Asymmetric right apical pleural-parenchymal scarring. Chronic subpleural reticulation anteriorly right upper lobe possibly XRT related. There remains no consolidation, effusion or pneumothorax. No pulmonary nodules. Chronic asymmetric elevation right diaphragm. Central airways are patent. Musculoskeletal: There were scattered metastases in the ribcage on yesterday's bone scan new from 07/28/2021, mostly in the posterior ribs but some were present in the left anterior ribs. Most of these are fairly subtle on CT. There is faint sclerosis and indistinctness of the cortex of the posterior right fourth through sixth ribs and 2 some extent of the posterior right seventh rib, similar changes in the left posterior seventh and eighth ribs and questionably in the posteromedial left  ninth rib, with loss of cortical definition consistent with metastasis involving the posterior left tenth and eleventh ribs. No discrete lesion is seen corresponding to the scattered abnormal activity in the anterior left rib cage. In the thoracic spine there is patchy sclerosis consistent with metastatic involvement newly noted since 07/28/2021 in the T1 and T2 vertebral bodies, with mixed sclerotic/lytic involvement suspected in the T4 and T5 vertebrae, lytic involvement in the anterior T8 body, mixed involvement predominantly lytic in the T11 body and right T10 articular pillar, and lytic involvement affecting much of the T11 body. There is a chronic moderate anterior wedge compression fracture deformity of the T12 body which is unchanged, but as seen on yesterday's exam there is a mild upper plate recent anterior wedge compression fracture deformity of the T6 body, and increased central endplate depression deformities both superiorly and inferiorly T10 and T11, all mild. No paraspinal hematoma is seen. The pedicles and posterior elements are intact. There is multilevel bridging enthesopathy which is also intact. Review of the MIP images confirms the above findings. CTA ABDOMEN AND PELVIS FINDINGS VASCULAR Aorta: There is moderate patchy calcific/mixed plaque without dissection, aneurysm or stenosis. Celiac: Patent without evidence of aneurysm, dissection, vasculitis or significant stenosis. There are nonstenosing ostial calcifications at the superior vessel origin. SMA: Patent without evidence of aneurysm, dissection, vasculitis or significant stenosis. There are nonstenosing ostial calcific plaques. Renals: Both are single. On the right, there is a 75% stenosis in the proximal 5 mm of the vessel due to calcific plaques. On the left, there is a 60% stenosis in the proximal 8 mm of the vessel due to soft plaques along the superior wall. Both renal arteries are otherwise widely patent. No branch occlusion is seen.  IMA: Patent without evidence of aneurysm, dissection, vasculitis or significant stenosis.  Inflow: There are patchy calcifications in the common iliac and internal iliac arteries, no significant disease in the external iliac arteries and no flow-limiting inflow vessel stenosis. Veins: No obvious venous abnormality within the limitations of this arterial phase study. Review of the MIP images confirms the above findings. NON-VASCULAR Hepatobiliary: The liver is 20 cm length, with diffuse metastatic disease developed since 07/28/2021. There are multiple rim enhancing centrally necrotic masses involving all segments. The metastases were better visualized on yesterday's exam. Example lesions include an irregular hypodense mass in segment 6 measuring 4.3 x 3.9 cm, rim enhancing mass further down in segment 6 measuring 3.3 x 2.9 cm, rim enhancing mass in segment 4A measuring 2.8 x 2.9 cm, 3.5 x 3.1 cm in segment 7, and a rim enhancing segment 3 lesion measuring 3.4 x 3.1 cm. Pancreas: Since yesterday's exam there has developed pancreatic and peripancreatic edema, relatively mild in degree consistent with acute interstitial pancreatitis. Small volume of low-density fluid tracks along side the pancreatic tail in the upper left paracolic gutter. There is fatty infiltration in the pancreas but no mass enhancement and no obvious necrosis, no ductal dilatation. No pancreatic abscess is seen. Spleen: Occasional calcified granulomas.  No other abnormality. Adrenals/Urinary Tract: There is no adrenal mass. Slight chronic nodular thickening of both adrenal glands is unaltered. There are scattered cortical scar-like defects both kidneys. There is no renal mass enhancement, stone or hydronephrosis. The bladder contains contrast. The bladder is not well seen due to spray artifact from a left hip replacement. The visualized portion of the right lateral wall is unremarkable. Stomach/Bowel: No dilatation or wall thickening including the  sub cecal appendix. There is moderate constipation ascending and transverse colon. Left-sided diverticulosis greatest in the sigmoid without acute inflammatory change. Lymphatic: Increased retroperitoneal adenopathy. This includes portacaval lymph nodes up to 1.3 cm in short axis, left preaortic nodes to 1 cm in short axis, left para-aortic chain adenopathy up to 1.5 cm in short axis, a left common iliac chain node measuring 2.2 x 1.5 cm, with no other visible adenopathy. Reproductive: Uterus and bilateral adnexa are unremarkable. Other: In addition to the peripancreatic fluid described above there is trace fluid in the posterior deep pelvis. There is no free air, free hemorrhage or abscess. Musculoskeletal: Osteopenia and degenerative change lumbar spine. There is mixed lytic and sclerotic metastatic involvement of all 5 lumbar vertebral bodies. There is a new mild pathologic anterior wedge compression fracture of the L3 vertebral body, also new mild pathologic compression fracture deformity generalized at L4 with no change in mild chronic wedging of L5. Destructive lesions involving both iliac crests, greater on the right and not seen in 2023. Scattered sclerotic metastatic disease in the sacrum. Additional sclerotic metastasis new from 2023 in the right ischial tuberosity, with lytic disease in the right pubic bone. The bone around the right hip replacement not well seen due to spray artifact. Pathologic posterior bowing of the L4 cortex into the spinal canal causes severe spinal stenosis at L4 and this may also be present at L3. Review of the MIP images confirms the above findings. IMPRESSION: 1. Aortic atherosclerosis without evidence of aneurysm or dissection. 2. Bilateral renal artery stenoses. No other significant visceral arterial stenoses. 3. Interval development of diffuse hepatic and skeletal metastatic disease since the 07/28/2021 exam. 4. Interval development just since yesterday CT findings of acute  interstitial pancreatitis with peripancreatic fluid tracking along the pancreatic tail into the upper left paracolic gutter with trace amount in the posterior  pelvis. No pancreatic necrosis or ductal dilatation. 5. Increased retroperitoneal / portacaval adenopathy. 6. Increased subsegmental atelectasis in the posterior lungs. 7. New multilevel metastatic involvement of the ribcage, spine, sacrum and bony pelvis. 8. New mild pathologic compression fractures of the T6, T10, T11, L3 and L4 vertebral bodies. No posterior element fractures are seen. 9. Pathologic posterior bowing of the L4 vertebral body into the spinal canal causes severe spinal stenosis at L4 and may also be present at L3. 10. Additional findings described above. Aortic Atherosclerosis (ICD10-I70.0). Electronically Signed   By: Almira Bar M.D.   On: Mar 14, 2023 22:48    Microbiology: No results found for this or any previous visit (from the past 240 hour(s)).  Time spent: 45 minutes  Signed: Joseph Art, DO 02/19/2023

## 2023-02-28 NOTE — Plan of Care (Signed)
  Problem: Coping: Goal: Level of anxiety will decrease Outcome: Progressing   Problem: Pain Managment: Goal: General experience of comfort will improve Outcome: Progressing   

## 2023-02-28 NOTE — Progress Notes (Signed)
Patient transported to the morgue via stretcher. Rica Koyanagi POA stated that she would call back with funeral home information.

## 2023-02-28 NOTE — Progress Notes (Cosign Needed)
30ml of IV dilaudid wasted per protocol

## 2023-02-28 NOTE — Progress Notes (Signed)
Daily Progress Note   Patient Name: Alicia Vasquez       Date: 03/06/23 DOB: Apr 22, 1941  Age: 82 y.o. MRN#: [id number] Attending Physician: Alicia Art, DO Primary Care Physician: Alicia Rubin, FNP Admit Date: 02/24/2023 Length of Stay: 5 days  Reason for Consultation/Follow-up: Establishing goals of care  Subjective:   CC: Patient sleeping comfortably in bed. Following up regarding complex medical decision making.   Subjective:  Reviewed EMR prior to presenting to bedside.  Patient has already been transition to full comfort focused care.  Patient receiving IV Dilaudid 1 mg/h infusion.  In past 24 hours patient also required additional IV Dilaudid bolus of 2 mg x 1 dose.  Patient now unable to take oral medications.  Presented to bedside to check on patient.  No family or friends present at bedside.  Patient seen sleeping comfortably at time of visit.  Did note respiratory secretions appreciated; otherwise patient appears comfortable on current comfort focused medications. Discussed care with IDT including hospitalist and RN.   Objective:   Vital Signs:  BP (!) 109/47 (BP Location: Left Arm)   Pulse (!) 102   Temp 99 F (37.2 C) (Oral)   Resp 18   Wt 86.7 kg   SpO2 95%   BMI 32.81 kg/m   Physical Exam: General: Sleeping, chronically ill-appearing, comfortable HENT: Dry mucous membranes Cardiovascular: Tachycardia noted Respiratory: no increased work of breathing noted, not in respiratory distress, secretions appreciated Neuro: Sleeping  Imaging:  I personally reviewed recent imaging.   Assessment & Plan:   Assessment: Patient is an 82 year old female with a past medical history of metastatic breast cancer, acute pancreatitis, hypercalcemia in setting of malignancy, functional decline and uncontrolled pain who was admitted on 02/17/2023.  During hospitalization decision was made to transition to comfort focused care.  Recommendations/Plan: # Complex  medical decision making/goals of care: # Complex medical decision making/goals of care  -Patient unable to participate in medical decision making secondary to his mental status.  -As per EMR review, patient's HCPOA Alicia Vasquez have already discussed and patient was transitioned to comfort focused care. Anticipate in hospital death vs transfer to inpatient hospice facility. TOC consulted to assist with discharge planning.   - Code Status: DNR   # Symptom management    -Pain/Dyspnea, acute in the setting of end-of-life care                 -Continue IV dilaudid 1mg /hr infusion. Continue IV dilaudid RN bolus of 1-2 mg q2 hrs prn. Continue to adjust based on patient's symptom burden.                  -Secretions, in the setting of end-of-life care                               -Start IV glycopyrrolate 0.2 mg every 4 hours as needed.  # Psychosocial Support:  -HCPOA: friend- Alicia Vasquez  # Discharge Planning: In hospital death vs inpatient hospice evaluation and transfer   Discussed with: hospitalist, RN, [facility]   Thank you for allowing the palliative care team to participate in the care Alicia Vasquez.  Alicia Morin, DO Palliative Care Provider PMT # [phone]  If patient remains symptomatic despite maximum doses, please call PMT at [phone] between 0700 and 1900. Outside of these hours, please call attending, as PMT does not have night coverage.  *Please note that this is a  verbal dictation therefore any spelling or grammatical errors are due to the "[employer]" system interpretation.

## 2023-02-28 NOTE — Progress Notes (Signed)
03/07/2023 1256  Attending Physican Contact  Attending Physician Notified Y  Attending Physician (First and Last Name) Marlin Canary  Post Mortem Checklist  Date of Death 2023-03-07  Time of Death 1244  Pronounced By Illene Silver RN/Holli Socin RN  Next of kin notified Yes  Name of next of kin notified of death Rica Koyanagi  Contact Person's Relationship to Patient Virl Diamond Person's Phone Number [phone]  Was the patient a No Code Blue or a Limited Code Blue? Yes  Did the patient die unattended? Yes  Patient restrained? Not applicable  Height 5\' 4"  (1.626 m)  Weight 86.7 kg  [facility] (previously known as [facility])  Notification Date Mar 07, 2023  Notification Time 1307  [facility] Number [phone]  Is patient a potential donor? N  Autopsy  Autopsy requested by MD or Family ( Non ME Case) N/A  Medical Examiner  Is this a medical examiner's case? N

## 2023-02-28 DEATH — deceased

## 2023-03-06 ENCOUNTER — Ambulatory Visit: Payer: Medicare Other | Admitting: Internal Medicine

## 2023-03-20 ENCOUNTER — Ambulatory Visit: Payer: Medicare Other | Admitting: Hematology and Oncology

## 2023-03-20 ENCOUNTER — Ambulatory Visit: Payer: Medicare Other

## 2023-03-20 ENCOUNTER — Other Ambulatory Visit: Payer: Medicare Other

## 2023-04-17 ENCOUNTER — Ambulatory Visit: Payer: Medicare Other

## 2023-04-17 ENCOUNTER — Other Ambulatory Visit: Payer: Medicare Other

## 2023-04-19 ENCOUNTER — Ambulatory Visit: Payer: Medicare Other | Admitting: Podiatry

## 2023-05-15 ENCOUNTER — Ambulatory Visit: Payer: Medicare Other

## 2023-05-15 ENCOUNTER — Other Ambulatory Visit: Payer: Medicare Other

## 2023-05-15 ENCOUNTER — Ambulatory Visit: Payer: Medicare Other | Admitting: Adult Health

## 2023-07-19 ENCOUNTER — Ambulatory Visit: Payer: Medicare Other | Admitting: Podiatry

## 2023-11-07 ENCOUNTER — Ambulatory Visit: Payer: Medicare Other | Admitting: Internal Medicine
